# Patient Record
Sex: Male | Born: 1965 | Race: White | Hispanic: No | Marital: Married | Smoking: Never smoker
Health system: Southern US, Community
[De-identification: ages and names within clinical notes are randomized; demographics above are authoritative.]

## PROBLEM LIST (undated history)

## (undated) DIAGNOSIS — H9190 Unspecified hearing loss, unspecified ear: Secondary | ICD-10-CM

## (undated) DIAGNOSIS — I82409 Acute embolism and thrombosis of unspecified deep veins of unspecified lower extremity: Secondary | ICD-10-CM

## (undated) DIAGNOSIS — J189 Pneumonia, unspecified organism: Secondary | ICD-10-CM

## (undated) DIAGNOSIS — Z9289 Personal history of other medical treatment: Secondary | ICD-10-CM

## (undated) DIAGNOSIS — K760 Fatty (change of) liver, not elsewhere classified: Secondary | ICD-10-CM

## (undated) DIAGNOSIS — Z8489 Family history of other specified conditions: Secondary | ICD-10-CM

## (undated) DIAGNOSIS — L409 Psoriasis, unspecified: Secondary | ICD-10-CM

## (undated) DIAGNOSIS — Q6211 Congenital occlusion of ureteropelvic junction: Secondary | ICD-10-CM

## (undated) DIAGNOSIS — I517 Cardiomegaly: Secondary | ICD-10-CM

## (undated) DIAGNOSIS — G8929 Other chronic pain: Secondary | ICD-10-CM

## (undated) DIAGNOSIS — I5032 Chronic diastolic (congestive) heart failure: Secondary | ICD-10-CM

## (undated) DIAGNOSIS — M51379 Other intervertebral disc degeneration, lumbosacral region without mention of lumbar back pain or lower extremity pain: Secondary | ICD-10-CM

## (undated) DIAGNOSIS — Z8673 Personal history of transient ischemic attack (TIA), and cerebral infarction without residual deficits: Secondary | ICD-10-CM

## (undated) DIAGNOSIS — M1990 Unspecified osteoarthritis, unspecified site: Secondary | ICD-10-CM

## (undated) DIAGNOSIS — Z9889 Other specified postprocedural states: Secondary | ICD-10-CM

## (undated) DIAGNOSIS — F4310 Post-traumatic stress disorder, unspecified: Secondary | ICD-10-CM

## (undated) DIAGNOSIS — M542 Cervicalgia: Secondary | ICD-10-CM

## (undated) DIAGNOSIS — H269 Unspecified cataract: Secondary | ICD-10-CM

## (undated) DIAGNOSIS — Z87442 Personal history of urinary calculi: Secondary | ICD-10-CM

## (undated) DIAGNOSIS — M5136 Other intervertebral disc degeneration, lumbar region: Secondary | ICD-10-CM

## (undated) DIAGNOSIS — Z8669 Personal history of other diseases of the nervous system and sense organs: Secondary | ICD-10-CM

## (undated) DIAGNOSIS — R112 Nausea with vomiting, unspecified: Secondary | ICD-10-CM

## (undated) DIAGNOSIS — R569 Unspecified convulsions: Secondary | ICD-10-CM

## (undated) DIAGNOSIS — Z87890 Personal history of sex reassignment: Secondary | ICD-10-CM

## (undated) DIAGNOSIS — G709 Myoneural disorder, unspecified: Secondary | ICD-10-CM

## (undated) DIAGNOSIS — D6859 Other primary thrombophilia: Secondary | ICD-10-CM

## (undated) DIAGNOSIS — Z8619 Personal history of other infectious and parasitic diseases: Secondary | ICD-10-CM

## (undated) DIAGNOSIS — F419 Anxiety disorder, unspecified: Secondary | ICD-10-CM

## (undated) DIAGNOSIS — T84018A Broken internal joint prosthesis, other site, initial encounter: Secondary | ICD-10-CM

## (undated) DIAGNOSIS — S62009A Unspecified fracture of navicular [scaphoid] bone of unspecified wrist, initial encounter for closed fracture: Secondary | ICD-10-CM

## (undated) DIAGNOSIS — R748 Abnormal levels of other serum enzymes: Secondary | ICD-10-CM

## (undated) DIAGNOSIS — I639 Cerebral infarction, unspecified: Secondary | ICD-10-CM

## (undated) DIAGNOSIS — K9041 Non-celiac gluten sensitivity: Secondary | ICD-10-CM

## (undated) DIAGNOSIS — Z96659 Presence of unspecified artificial knee joint: Secondary | ICD-10-CM

## (undated) DIAGNOSIS — D751 Secondary polycythemia: Secondary | ICD-10-CM

## (undated) DIAGNOSIS — G4730 Sleep apnea, unspecified: Secondary | ICD-10-CM

## (undated) DIAGNOSIS — M5090 Cervical disc disorder, unspecified, unspecified cervical region: Secondary | ICD-10-CM

## (undated) DIAGNOSIS — K219 Gastro-esophageal reflux disease without esophagitis: Secondary | ICD-10-CM

## (undated) DIAGNOSIS — R000 Tachycardia, unspecified: Secondary | ICD-10-CM

## (undated) DIAGNOSIS — K5909 Other constipation: Secondary | ICD-10-CM

## (undated) DIAGNOSIS — I341 Nonrheumatic mitral (valve) prolapse: Secondary | ICD-10-CM

## (undated) DIAGNOSIS — Z973 Presence of spectacles and contact lenses: Secondary | ICD-10-CM

## (undated) DIAGNOSIS — Z8719 Personal history of other diseases of the digestive system: Secondary | ICD-10-CM

## (undated) DIAGNOSIS — R634 Abnormal weight loss: Secondary | ICD-10-CM

## (undated) DIAGNOSIS — L309 Dermatitis, unspecified: Secondary | ICD-10-CM

## (undated) DIAGNOSIS — F640 Transsexualism: Secondary | ICD-10-CM

## (undated) DIAGNOSIS — K900 Celiac disease: Secondary | ICD-10-CM

## (undated) DIAGNOSIS — Z789 Other specified health status: Secondary | ICD-10-CM

## (undated) DIAGNOSIS — R161 Splenomegaly, not elsewhere classified: Secondary | ICD-10-CM

## (undated) DIAGNOSIS — G950 Syringomyelia and syringobulbia: Secondary | ICD-10-CM

## (undated) DIAGNOSIS — Z974 Presence of external hearing-aid: Secondary | ICD-10-CM

## (undated) DIAGNOSIS — M5137 Other intervertebral disc degeneration, lumbosacral region: Secondary | ICD-10-CM

## (undated) HISTORY — DX: Personal history of other diseases of the digestive system: Z87.19

## (undated) HISTORY — DX: Abnormal weight loss: R63.4

## (undated) HISTORY — DX: Other specified health status: Z78.9

## (undated) HISTORY — DX: Congenital occlusion of ureteropelvic junction: Q62.11

## (undated) HISTORY — DX: Secondary polycythemia: D75.1

## (undated) HISTORY — PX: THUMB ARTHROSCOPY: SHX2509

## (undated) HISTORY — DX: Anxiety disorder, unspecified: F41.9

## (undated) HISTORY — DX: Transsexualism: F64.0

## (undated) HISTORY — PX: MASTECTOMY: SHX3

## (undated) HISTORY — DX: Personal history of sex reassignment: Z87.890

## (undated) HISTORY — PX: MOUTH SURGERY: SHX715

## (undated) HISTORY — DX: Non-celiac gluten sensitivity: K90.41

## (undated) HISTORY — DX: Sleep apnea, unspecified: G47.30

## (undated) HISTORY — DX: Unspecified fracture of navicular (scaphoid) bone of unspecified wrist, initial encounter for closed fracture: S62.009A

## (undated) HISTORY — DX: Personal history of transient ischemic attack (TIA), and cerebral infarction without residual deficits: Z86.73

## (undated) HISTORY — DX: Other primary thrombophilia: D68.59

## (undated) HISTORY — DX: Unspecified cataract: H26.9

## (undated) HISTORY — PX: EYE SURGERY: SHX253

## (undated) HISTORY — PX: KNEE ARTHROPLASTY: SHX992

## (undated) HISTORY — DX: Cervicalgia: M54.2

## (undated) HISTORY — PX: SHOULDER SURGERY: SHX246

## (undated) HISTORY — DX: Pneumonia, unspecified organism: J18.9

## (undated) HISTORY — DX: Celiac disease: K90.0

## (undated) HISTORY — PX: COLONOSCOPY: SHX174

## (undated) HISTORY — DX: Unspecified osteoarthritis, unspecified site: M19.90

## (undated) HISTORY — PX: OVARIAN CYST SURGERY: SHX726

## (undated) HISTORY — PX: KNEE JOINT MANIPULATION: SHX386

## (undated) HISTORY — DX: Presence of unspecified artificial knee joint: Z96.659

## (undated) HISTORY — DX: Broken internal joint prosthesis, other site, initial encounter: T84.018A

## (undated) HISTORY — DX: Syringomyelia and syringobulbia: G95.0

## (undated) HISTORY — PX: CHOLECYSTECTOMY: SHX55

---
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
## 1992-03-03 HISTORY — PX: ABDOMINAL HYSTERECTOMY: SHX81

## 2001-03-03 HISTORY — PX: UPPER GI ENDOSCOPY: SHX6162

## 2002-09-20 DIAGNOSIS — Z789 Other specified health status: Secondary | ICD-10-CM | POA: Insufficient documentation

## 2003-03-04 HISTORY — PX: LITHOTRIPSY: SUR834

## 2005-03-03 HISTORY — PX: ANKLE ARTHROSCOPY WITH RECONSTRUCTION: SHX5583

## 2006-03-03 HISTORY — PX: THYROIDECTOMY, PARTIAL: SHX18

## 2010-05-27 DIAGNOSIS — K900 Celiac disease: Secondary | ICD-10-CM | POA: Insufficient documentation

## 2010-05-27 DIAGNOSIS — E041 Nontoxic single thyroid nodule: Secondary | ICD-10-CM | POA: Insufficient documentation

## 2010-06-05 DIAGNOSIS — Z9229 Personal history of other drug therapy: Secondary | ICD-10-CM | POA: Insufficient documentation

## 2010-12-11 DIAGNOSIS — N1330 Unspecified hydronephrosis: Secondary | ICD-10-CM | POA: Insufficient documentation

## 2010-12-17 DIAGNOSIS — J189 Pneumonia, unspecified organism: Secondary | ICD-10-CM

## 2010-12-17 HISTORY — DX: Pneumonia, unspecified organism: J18.9

## 2011-03-04 HISTORY — PX: LIVER BIOPSY: SHX301

## 2011-12-29 DIAGNOSIS — L4050 Arthropathic psoriasis, unspecified: Secondary | ICD-10-CM | POA: Insufficient documentation

## 2012-03-29 DIAGNOSIS — R748 Abnormal levels of other serum enzymes: Secondary | ICD-10-CM | POA: Insufficient documentation

## 2012-04-29 DIAGNOSIS — L4050 Arthropathic psoriasis, unspecified: Secondary | ICD-10-CM | POA: Insufficient documentation

## 2012-06-01 DIAGNOSIS — K760 Fatty (change of) liver, not elsewhere classified: Secondary | ICD-10-CM | POA: Insufficient documentation

## 2012-10-18 DIAGNOSIS — R002 Palpitations: Secondary | ICD-10-CM | POA: Insufficient documentation

## 2012-10-25 DIAGNOSIS — R51 Headache: Secondary | ICD-10-CM

## 2012-10-25 DIAGNOSIS — R519 Headache, unspecified: Secondary | ICD-10-CM | POA: Insufficient documentation

## 2013-03-15 DIAGNOSIS — M1710 Unilateral primary osteoarthritis, unspecified knee: Secondary | ICD-10-CM | POA: Diagnosis present

## 2013-03-15 DIAGNOSIS — M179 Osteoarthritis of knee, unspecified: Secondary | ICD-10-CM | POA: Diagnosis present

## 2013-05-10 DIAGNOSIS — E669 Obesity, unspecified: Secondary | ICD-10-CM | POA: Insufficient documentation

## 2013-05-10 DIAGNOSIS — M1990 Unspecified osteoarthritis, unspecified site: Secondary | ICD-10-CM | POA: Insufficient documentation

## 2013-05-10 DIAGNOSIS — R600 Localized edema: Secondary | ICD-10-CM | POA: Insufficient documentation

## 2013-05-10 DIAGNOSIS — IMO0002 Reserved for concepts with insufficient information to code with codable children: Secondary | ICD-10-CM | POA: Insufficient documentation

## 2013-05-10 DIAGNOSIS — E039 Hypothyroidism, unspecified: Secondary | ICD-10-CM | POA: Insufficient documentation

## 2013-05-10 DIAGNOSIS — K219 Gastro-esophageal reflux disease without esophagitis: Secondary | ICD-10-CM | POA: Insufficient documentation

## 2013-05-11 HISTORY — PX: HIP ARTHROSCOPY W/ LABRAL REPAIR: SHX1750

## 2013-07-25 DIAGNOSIS — R479 Unspecified speech disturbances: Secondary | ICD-10-CM | POA: Insufficient documentation

## 2013-08-01 DIAGNOSIS — D6859 Other primary thrombophilia: Secondary | ICD-10-CM | POA: Insufficient documentation

## 2013-08-01 DIAGNOSIS — F4310 Post-traumatic stress disorder, unspecified: Secondary | ICD-10-CM | POA: Insufficient documentation

## 2013-08-04 DIAGNOSIS — F418 Other specified anxiety disorders: Secondary | ICD-10-CM | POA: Insufficient documentation

## 2013-08-31 DIAGNOSIS — G4731 Primary central sleep apnea: Secondary | ICD-10-CM | POA: Insufficient documentation

## 2013-09-23 DIAGNOSIS — S62009A Unspecified fracture of navicular [scaphoid] bone of unspecified wrist, initial encounter for closed fracture: Secondary | ICD-10-CM

## 2013-09-23 HISTORY — DX: Unspecified fracture of navicular (scaphoid) bone of unspecified wrist, initial encounter for closed fracture: S62.009A

## 2013-11-29 ENCOUNTER — Telehealth: Payer: Self-pay | Admitting: Hematology

## 2013-11-29 NOTE — Telephone Encounter (Signed)
LEFT MESSAGE FOR PATIENT TO RETUERN CALL TO SCHEDULE NP APPT

## 2013-12-08 DIAGNOSIS — G4733 Obstructive sleep apnea (adult) (pediatric): Secondary | ICD-10-CM | POA: Insufficient documentation

## 2013-12-15 ENCOUNTER — Telehealth: Payer: Self-pay | Admitting: Hematology

## 2013-12-15 NOTE — Telephone Encounter (Signed)
S/W PATIENT AND  GAVE NEW D/T FOR 10/26 @ 10:30 W/DR. SEHABI.

## 2013-12-16 ENCOUNTER — Ambulatory Visit: Payer: Self-pay

## 2013-12-26 ENCOUNTER — Telehealth: Payer: Self-pay | Admitting: Hematology

## 2013-12-26 ENCOUNTER — Ambulatory Visit (HOSPITAL_BASED_OUTPATIENT_CLINIC_OR_DEPARTMENT_OTHER): Payer: BC Managed Care – PPO

## 2013-12-26 ENCOUNTER — Encounter (INDEPENDENT_AMBULATORY_CARE_PROVIDER_SITE_OTHER): Payer: Self-pay

## 2013-12-26 ENCOUNTER — Ambulatory Visit: Payer: BC Managed Care – PPO

## 2013-12-26 ENCOUNTER — Other Ambulatory Visit: Payer: Self-pay

## 2013-12-26 ENCOUNTER — Ambulatory Visit (HOSPITAL_BASED_OUTPATIENT_CLINIC_OR_DEPARTMENT_OTHER): Payer: BC Managed Care – PPO | Admitting: Hematology

## 2013-12-26 ENCOUNTER — Encounter: Payer: Self-pay | Admitting: Hematology

## 2013-12-26 VITALS — BP 111/81 | HR 74 | Temp 98.3°F | Resp 18 | Ht 68.0 in | Wt 203.7 lb

## 2013-12-26 DIAGNOSIS — L4050 Arthropathic psoriasis, unspecified: Secondary | ICD-10-CM

## 2013-12-26 DIAGNOSIS — D751 Secondary polycythemia: Secondary | ICD-10-CM

## 2013-12-26 DIAGNOSIS — R413 Other amnesia: Secondary | ICD-10-CM

## 2013-12-26 DIAGNOSIS — F640 Transsexualism: Secondary | ICD-10-CM

## 2013-12-26 DIAGNOSIS — M542 Cervicalgia: Secondary | ICD-10-CM

## 2013-12-26 DIAGNOSIS — D6859 Other primary thrombophilia: Secondary | ICD-10-CM

## 2013-12-26 DIAGNOSIS — F418 Other specified anxiety disorders: Secondary | ICD-10-CM

## 2013-12-26 DIAGNOSIS — G458 Other transient cerebral ischemic attacks and related syndromes: Secondary | ICD-10-CM

## 2013-12-26 DIAGNOSIS — Z862 Personal history of diseases of the blood and blood-forming organs and certain disorders involving the immune mechanism: Secondary | ICD-10-CM

## 2013-12-26 DIAGNOSIS — Z8719 Personal history of other diseases of the digestive system: Secondary | ICD-10-CM

## 2013-12-26 DIAGNOSIS — G4730 Sleep apnea, unspecified: Secondary | ICD-10-CM

## 2013-12-26 DIAGNOSIS — R634 Abnormal weight loss: Secondary | ICD-10-CM

## 2013-12-26 DIAGNOSIS — Z789 Other specified health status: Secondary | ICD-10-CM

## 2013-12-26 DIAGNOSIS — I82401 Acute embolism and thrombosis of unspecified deep veins of right lower extremity: Secondary | ICD-10-CM

## 2013-12-26 DIAGNOSIS — Z8673 Personal history of transient ischemic attack (TIA), and cerebral infarction without residual deficits: Secondary | ICD-10-CM

## 2013-12-26 LAB — CBC & DIFF AND RETIC
BASO%: 0 % (ref 0.0–2.0)
Basophils Absolute: 0 10*3/uL (ref 0.0–0.1)
EOS%: 0 % (ref 0.0–7.0)
Eosinophils Absolute: 0 10*3/uL (ref 0.0–0.5)
HCT: 50.7 % — ABNORMAL HIGH (ref 38.4–49.9)
HGB: 17.7 g/dL — ABNORMAL HIGH (ref 13.0–17.1)
Immature Retic Fract: 2.8 % — ABNORMAL LOW (ref 3.00–10.60)
LYMPH#: 1.4 10*3/uL (ref 0.9–3.3)
LYMPH%: 28.9 % (ref 14.0–49.0)
MCH: 31.9 pg (ref 27.2–33.4)
MCHC: 34.9 g/dL (ref 32.0–36.0)
MCV: 91.4 fL (ref 79.3–98.0)
MONO#: 0.4 10*3/uL (ref 0.1–0.9)
MONO%: 8.4 % (ref 0.0–14.0)
NEUT#: 3 10*3/uL (ref 1.5–6.5)
NEUT%: 62.7 % (ref 39.0–75.0)
NRBC: 0 % (ref 0–0)
Platelets: 155 10*3/uL (ref 140–400)
RBC: 5.55 10*6/uL (ref 4.20–5.82)
RDW: 13.3 % (ref 11.0–14.6)
RETIC %: 1.79 % (ref 0.80–1.80)
RETIC CT ABS: 99.35 10*3/uL — AB (ref 34.80–93.90)
WBC: 4.7 10*3/uL (ref 4.0–10.3)

## 2013-12-26 LAB — IRON AND TIBC CHCC
%SAT: 46 % (ref 20–55)
Iron: 138 ug/dL (ref 42–163)
TIBC: 298 ug/dL (ref 202–409)
UIBC: 159 ug/dL (ref 117–376)

## 2013-12-26 LAB — FERRITIN CHCC: Ferritin: 64 ng/ml (ref 22–316)

## 2013-12-26 MED ORDER — RIVAROXABAN (XARELTO) VTE STARTER PACK (15 & 20 MG): ORAL_TABLET | ORAL | Status: DC

## 2013-12-26 NOTE — Progress Notes (Signed)
Therapeutic phlebotomy done for 530 gms.  Tolerated well.  Offered beverage which patient accepted.   BP 118/75, pulse 68 post phlebotomy.

## 2013-12-26 NOTE — Telephone Encounter (Signed)
Gave avs & cal for Nov. Also gave pt prep for Ct scan.

## 2013-12-26 NOTE — Patient Instructions (Signed)

## 2013-12-26 NOTE — Progress Notes (Signed)
Checked in new pt with no financial concerns.  Pt is here for a hematology concern so no financial assistance may be needed but he has my card for any questions or concerns.

## 2013-12-27 ENCOUNTER — Encounter: Payer: Self-pay | Admitting: Hematology

## 2013-12-27 ENCOUNTER — Other Ambulatory Visit: Payer: Self-pay | Admitting: *Deleted

## 2013-12-27 ENCOUNTER — Telehealth: Payer: BC Managed Care – PPO | Admitting: Family

## 2013-12-27 DIAGNOSIS — D751 Secondary polycythemia: Secondary | ICD-10-CM

## 2013-12-27 DIAGNOSIS — Z86718 Personal history of other venous thrombosis and embolism: Secondary | ICD-10-CM | POA: Insufficient documentation

## 2013-12-27 DIAGNOSIS — M542 Cervicalgia: Secondary | ICD-10-CM | POA: Insufficient documentation

## 2013-12-27 DIAGNOSIS — Z8719 Personal history of other diseases of the digestive system: Secondary | ICD-10-CM | POA: Insufficient documentation

## 2013-12-27 DIAGNOSIS — D45 Polycythemia vera: Secondary | ICD-10-CM | POA: Insufficient documentation

## 2013-12-27 DIAGNOSIS — R634 Abnormal weight loss: Secondary | ICD-10-CM | POA: Insufficient documentation

## 2013-12-27 DIAGNOSIS — M545 Low back pain: Secondary | ICD-10-CM

## 2013-12-27 DIAGNOSIS — Z8673 Personal history of transient ischemic attack (TIA), and cerebral infarction without residual deficits: Secondary | ICD-10-CM | POA: Insufficient documentation

## 2013-12-27 MED ORDER — RIVAROXABAN (XARELTO) VTE STARTER PACK (15 & 20 MG): ORAL_TABLET | ORAL | Status: DC

## 2013-12-27 NOTE — Progress Notes (Signed)
[employer] [id number] approved ct chest and abdomen from 12/27/13-01/25/14 auth # [id number]

## 2013-12-27 NOTE — Progress Notes (Signed)
We are sorry that you are not feeling well.  Here is how we plan to help!  Based on what you have shared with me it looks like you will need a face to face visit for complicated symptoms that could represent a serious problem.  Please contact Dr. Arlys John first thing tomorrow morning to make a follow-up appointment.  If you are having a true medical emergency please call 911.  If you need an urgent face to face visit, [place] has four urgent care centers for your convenience.    [facility] [place] a Provider at this Location  [place], [place] [address]   8 am to 8 pm Monday-Friday   9 am to 7 pm [facility] [place] a Provider at this Location  [place], [place], [place] [address]   8 am to 8 pm Monday-Friday   9 am to 6 pm Saturday   11 am to 6 pm Sunday     [facility] [place] Get Driving Directions  [address] [place], [place] [address]   8 am to 8 pm Monday-Friday   9 am to 4 pm Saturday-Sunday     [facility] (a walk in primary care provider)  [place] a Provider at this Location  [place], [place] [address]   8 am to 8:30 pm Monday-Thursday   8 am to 6 pm Friday   8 am to 4 pm Saturday-Sunday  Your e-visit answers were reviewed by a board certified advanced clinical practitioner to complete your personal care plan.  Depending on the condition, your plan could have included both over the counter or prescription medications.  You will get an e-mail in the next two days asking about your experience.  I hope that your e-visit has been valuable and will speed your recovery . Thank you for choosing an e-visit.

## 2013-12-27 NOTE — Progress Notes (Signed)
[place] HEMATOLOGY CONSULT NOTE DATE OF VISIT: 12/27/2013  Patient Care Team: Aretta Nip, MD as PCP - General (Family Medicine) Alechia Lezama Marla Roe, MD as Consulting Physician (Hematology) Aretta Nip, MD as Referring Physician (Family Medicine)  CHIEF COMPLAINTS/PURPOSE OF CONSULTATION:   Discuss anticoagulation strategy and history of CVA/DVT and protein C deficiency.  HISTORY OF PRESENTING ILLNESS:   Jesse Bowers 48 y.o. 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 Patient is a former Software engineer. Patient had a fractured foot repair in 2007 and caused a right leg DVT. At the time he took Coumadin for 3 months.   In May of this year 2015 patient presented with right-sided weakness and slurring of speech, he felt tired, had some nausea and was having trouble gripping the soap when he was in the shower. He checked himself in the mirror there was no facial droop but based on the symptoms he texted his wife took him to the hospital where he was kept overnight. His symptoms of TIA lasted for more than 24 hours and he had appropriate neuro imaging done. When he was seen by a neurologist later, he was told that he had a left frontal stroke. This happened at a young age of 68 so his primary care physician ordered hypercoagulable panel and that showed a protein C deficiency. This TIA or stroke event took place on 07/27/2013 an MRI brain from that date showed changes of early chronic small vessel ischemic changes. A note from the previous hematologist Dr Laury Axon in [facility] indicated that at [facility] they also felt that it was not a stroke. Carotid ultrasound showed minimal atherosclerotic plaques. The hypercoagulable panel was drawn on 08/19/2013 and he was negative for factor V Leiden mutation, prothrombin gene mutation. He had normal levels of antithrombin III and protein S but the protein C  level was decreased at 47%(reference range is 70-180%). A repeat protein C activity done on 09/28/2013 was 58% still low. Lupus anticoagulant was negative. Beta-2 glycoprotein antibodies were negative.  Patient is currently receiving a baby aspirin for last 3 months. The hematologist in [place] Dr. Alain Marion also gave him some Lovenox injections for prophylactic use just before a long distance travel. He encouraged him to avoid inactivity/immobility. Patient was not given an anticoagulant such as warfarin or the new generation drugs like Xarelto or Eliquis.  On 04/28/2013 patient's CBC showed a white count of 6.2 hemoglobin 18.7 hematocrit 53.1 MCV 92 platelet count of 207. Differential was normal. Chemistry panel was normal creatinine 1.23. Patient does have history of elevated liver enzymes in the past and the etiology of that is not clear likely medication related. He had liver biopsy done in 2013 which was reportedly normal. Patient was taking Remicade for over an year and it was helping his skin lesions of psoriasis but not the joint pain. It was switched to Enbrel which he is still taking a subcutaneous shot once a week that is every Sunday.  Patient also have some pain control issues and was seeing a pain specialist in [place]. He is in the process of getting a referral to a pain specialist here. His pain medications include morphine extended release, oxycodone, occasional naproxen and methadone. Patient is also on antidepressants.  Patient also had a clot at PICC line for antibiotics in 2006. Patient had history of psoriasis and psoriatic arthritis being managed by a rheumatologist in [place]. Now he is shifting his care  to a local rheumatologist here. Most of his psoriasis involves the bottom of the feet mostly on the left side, hands, knees and ears.  Patient also had a history of right-sided parotitis documented in May 2015 that was treated with antibiotics. He does have chronic dry mouth because  of sleep apnea as well as his antidepressant therapy.  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 That itself can increase the risk for both arterial as well as venous thromboembolic events. 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 For his depression patient was on Prozac in March 2015 but it was causing decreased sexual libido and changed to Wellbutrin in March 2015. He does have mild constipation with the use of narcotics and takes Colace twice a day.  Patient is also complaining of 50 pound weight loss in the last 3-4 months. He thinks it is unintentional. He has been on immunosuppressant medication such as Remicade and Enbrel and sometimes those can be associated with possibility of non-Hodgkin lymphomas. He also have chronic pain and recently saw an orthopedic physician who recommended MRI of the neck which will be scheduled. He is also seeing a rheumatologist next month, a neurologist and a pain specialist. He does complain of left shoulder pain and left elbow pain. There was some discussion by previous rheumatologist in [place] about switching his biological therapy boost to Stelara or [place] Nail once he is evaluated by a rheumatologist. Patient had already received his flu shot as well as pneumonia shot. He also complains of having significant short-term memory problems and trouble concentrating which could be from his medications or TIA episode.  So patient's active hematologic problems are  #1 Protein C deficiency documented twice. #2 TIA/CVA in May 2015 #3 history of DVT in 2007 Right leg treated with Coumadin x 3 months. #3 Use of immunosuppressant medications which increase the likelihood of lymphomas. #4 50 pound weight loss since August 2015. 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 #6 2 episodes of Right parotitis treated with antibiotics #7 Whether he needs anticoagulation are not? If yes what drug to use? #8 Celiac disease which can also cause malabsorption leading to nutritional deficiencies. #9 Whether he needs therapeutic phlebotomy for his polycythemia?  MEDICAL HISTORY:   Past Medical History  Diagnosis Date  . Anxiety   . psoriatic arthritis   . H/O protein C deficiency   . Hx-TIA (transient ischemic attack)   . Clotting disorder   . Celiac disease   . H/O parotitis     right   . Polycythemia, secondary   . Transgendered   . Sleep apnea   . Neck pain   . Abnormal weight loss   . Gluten enteropathy     SURGICAL HISTORY:  Past Surgical History  Procedure Laterality Date  . Thyroidectomy, partial  2008  . Cholecystectomy    . Ankle arthroscopy with reconstruction Right 2007  . Knee surgery Bilateral 1984    Right ACL, left PCL repair  . Liver biopsy  2013    normal results.  . Hip arthroscopy w/ labral repair Right 05/11/2013    SOCIAL HISTORY:  History   Social History  . Marital Status: Married    Spouse Name: N/A    Number of Children: 2  . Years of Education: 4y college   Occupational History  .  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 Smoking status: Never Smoker   . Smokeless tobacco: Not on file  . Alcohol Use: Yes     Comment: wine once a month  . Drug Use: No  . Sexual Activity: Yes    Birth Control/ Protection: None     Comment: patient is a transgender on testosterone shots, no biological kids   Other Topics Concern  . Not on file   Social History Narrative   Education 4 year college, former Therapist, sports X 15 years, pediatric nurse practitioner x 6 years, did NP degree from [employer] [place]. Relocated to [place] about 2 months ago from [place], [place]. Patient was in [place] for last 4 years and prior to that in [place]. His wife is working as Economist for [employer]. Patient is not working and applying for disability. They have 2 kids but no biologic children.     FAMILY HISTORY:  Family History  Problem Relation Age of Onset  . Stroke Paternal Uncle     age 67  . Stroke Maternal Grandfather     85  . Hypertension Mother   . Psoriasis Mother   . Stroke Maternal Grandmother   . Congestive Heart Failure Maternal Grandmother   . Cancer Paternal Grandfather     ALLERGIES:  is allergic to penicillin g; vancomycin; duloxetine; gabapentin; and sulfa antibiotics.  MEDICATIONS:  Current Outpatient Prescriptions  Medication Sig Dispense Refill  . aspirin 81 MG tablet Take 81 mg by mouth daily.      [name] buPROPion (WELLBUTRIN SR) 150 MG 12 hr tablet Take 150 mg by mouth daily.      . clobetasol ointment (TEMOVATE) 0.05 % Apply topically daily. For psoriasis      . docusate sodium (COLACE) 50 MG capsule Take 1 capsule by mouth 3 (three) times daily as needed.      . enoxaparin (LOVENOX) 40 MG/0.4ML injection Inject 40 mg into the skin. Use if travel more than 2 hours      . etanercept (ENBREL) 50 MG/ML injection Inject into the skin once a week.      [name] FLUoxetine (PROZAC) 20 MG capsule Take 1 capsule by mouth daily.      . methadone (DOLOPHINE) 5 MG tablet 5 mg qAM, 10 mg at night      . morphine (MS CONTIN) 30 MG 12 hr tablet Take 30 mg by mouth 2 (two) times daily.      [name] testosterone cypionate (DEPOTESTOTERONE CYPIONATE) 200 MG/ML injection Inject into the muscle once a week.      . traZODone (DESYREL) 50 MG tablet Take 50 mg by mouth at bedtime.      . Rivaroxaban (XARELTO STARTER PACK) 15 & 20 MG TBPK Take as directed on package: Start with one 66m tablet by mouth twice a day with food. On Day 22, switch to one 242mtablet once a day with food.  51 each  3   No current facility-administered medications for this visit.    REVIEW OF SYSTEMS:   Constitutional: Denies fevers, chills or abnormal night sweats Eyes: Denies  blurriness of vision, double vision or watery eyes Ears, nose, mouth, throat, and face: Denies mucositis or sore throat, +Dry mouth Respiratory: Denies cough, dyspnea or wheezes Cardiovascular: Denies palpitation, chest discomfort or lower extremity swelling Gastrointestinal:  Denies nausea, heartburn, + change in bowel habits that is Constipation Skin: Hx of  psoriasis and arthropathy from it.  Lymphatics: Denies new lymphadenopathy or easy bruising, hx of right parotitis twice. Neurological:Denies numbness, tingling or new weaknesses, recent TIA March 2015 Behavioral/Psych: Mood is stable, no new changes, +depression and anxiety, +memory problems All other systems were reviewed with the patient and are negative.  PHYSICAL EXAMINATION: ECOG PERFORMANCE STATUS: 0  Filed Vitals:   12/26/13 1103  BP: 111/81  Pulse: 74  Temp: 98.3 F (36.8 C)  Resp: 18   Filed Weights   12/26/13 1103  Weight: 203 lb 11.2 oz (92.398 kg)    GENERAL:alert, no distress and comfortable SKIN: skin color, texture, turgor are normal, no rashes or significant lesions EYES: normal, conjunctiva are pink and non-injected, sclera clear OROPHARYNX:no exudate, no erythema and lips, buccal mucosa, and tongue normal  NECK: supple, thyroid normal size, non-tender, without nodularity LYMPH:  no palpable lymphadenopathy in the cervical, axillary or inguinal LUNGS: clear to auscultation and percussion with normal breathing effort HEART: regular rate & rhythm and no murmurs and no lower extremity edema ABDOMEN:abdomen soft, non-tender and normal bowel sounds Musculoskeletal:no cyanosis of digits and no clubbing  PSYCH: alert & oriented x 3 with fluent speech NEURO: no focal motor/sensory deficits  LABORATORY DATA:  I have reviewed the data as listed Lab Results  Component Value Date   WBC 4.7 12/26/2013   HGB 17.7* 12/26/2013   HCT 50.7* 12/26/2013   MCV 91.4 12/26/2013   PLT 155 12/26/2013   No results found  for this basename: NA, K, CL, CO2, GLUCOSE, BUN, CREATININE, CALCIUM, GFRNONAA, GFRAA, PROT, ALBUMIN, AST, ALT, ALKPHOS, BILITOT, BILIDIR, IBILI,  in the last 8760 hours  RADIOGRAPHIC STUDIES: No results found.  ASSESSMENT & PLAN:   1. Azul Brumett is a pleasant 48 years old man with a history of TIA/CVA clinically in May 2015 and thereafter he was placed on Baby Aspirin.  2. He did have Polycythemia in February 2015 but I don't see that being addressed in terms of diagnosis or management. It is likely from his sleep apnea as well as the use of testosterone injections.  3. Patient have a history of a DVT in the right leg in 2007 precipitated by an ankle surgery and treated with 3 months of Coumadin.  4. Thrombophilia workup showed that he has Protein C deficiency which is an inherited condition and can increase the risk for venous as well as arterial events. Three clinical syndromes are associated with protein C deficiency: Venous thromboembolism in heterozygous and rare homozygous or doubly heterozygous teenagers or adults Neonatal purpura fulminans in homozygous or doubly heterozygous newborns Coumadin-induced skin necrosis in certain heterozygous teenagers or adults.   5. The most common sites of disease are the deep veins of the legs, the iliofemoral veins, and the mesenteric veins. Cerebral venous thrombosis has been associated with protein C deficiency and with the other congenital prothrombotic conditions, particularly when combined with acquired risk factors for thrombosis such as oral contraceptives or use of testosterone or Polycythemia.   6. There have been reports of nonhemorrhagic arterial stroke in young adults with hereditary protein C deficiency, but larger studies have not convincingly demonstrated that protein C deficiency is a risk factor for the development of arterial thrombosis.  7. I'm also concerned about the significant weight loss in the setting of use of  immunosuppressant medications such as Remicade and Enbrel and I believe it is important to do a CT scan of the chest abdomen and pelvis to rule out any lymphoproliferative process  or the possibility of a solid tumor which can also cause paraneoplastic polycythemia.  8. My recommendation for his anticoagulation is that since he has a history of DVT, protein C deficiency and recently TIA versus stroke, we should anticoagulate him fully and I would advocate using Xarelto. It is a factor X-a inhibitor.Dose is 15 mg orally twice daily with food for the first 21 days for the initial treatment of acute DVT or PE. After the initial treatment period, 20 mg orally once daily with food for the remaining treatment and the long-term reduction in the risk of recurrence of DVT and of PE. It is approved by the FDA in patients with atrial fibrillation to reduce the risk for stroke as it is a blood thinner. I would at least recommend 3 more months of baby aspirin thromboprophylaxis and thereafter in consultation with the neurologist, we can leave him on Xarelto alone. Patient will also discuss protein C with his siblings and their children and they should be getting genetic counseling and testing. Patient specifically mentioned that one of his sister have trouble with miscarriages and there may be a connection between protein C deficiency and miscarriages. Xarelto can cause risk of spontaneous bleeding or procedure related bleeding like epidural hematomas leading to complications, kidney and liver problems and no good antidote is available. It is mainly an issue of risks versus benefits and we reached the consensus that benefits outweigh the risks in his case with the recent TIA episode, polycythemia, DVT history and having a protein C deficiency.  9. Patient also got a therapeutic phlebotomy in my office today as his hematocrit was 50.7. I'm also checking a E-93 and folic acid level and serum JAK-2 mutation to rule out any  myeloproliferative diseases. I also ordered iron studies and ferritin.   10. Patient will return to clinic in 2 weeks for follow-up. While he is on Xarelto, he does not have to take Lovenox. In case patient needs any elective surgery epidural injections, he should stop Xarelto day before the procedure and resume it after procedure once adequate hemostasis is achieved as deemed by the surgeon. I will make a plan for future TP (Therapeutic phlebotomies) once I see him in office initially may be once a month for a hematocrit trigger of 45 or greater.    Orders Placed This Encounter  Procedures  . CT Chest Wo Contrast    Standing Status: Future     Number of Occurrences:      Standing Expiration Date: 12/26/2014    Order Specific Question:  Reason for Exam (SYMPTOM  OR DIAGNOSIS REQUIRED)    Answer:  50 lbs weight loss, r/o lymphoma or solid tumor, pt on immunosuppressants for psoriasis     Comments:  prefer in pm    Order Specific Question:  Preferred imaging location?    Answer:  [facility]  . CT Abdomen W Contrast    Standing Status: Future     Number of Occurrences:      Standing Expiration Date: 12/26/2014    Order Specific Question:  Reason for Exam (SYMPTOM  OR DIAGNOSIS REQUIRED)    Answer:  50 lbs weight loss, r/o lymphoma or solid tumor, pt on immunosuppressants for psoriasis     Comments:  pm prefer    Order Specific Question:  Preferred imaging location?    Answer:  [facility]  . CBC with Differential    Standing Status: Future     Number of Occurrences: 1  Standing Expiration Date: 12/27/2015  . Reticulocyte Count    Standing Status: Future     Number of Occurrences: 1     Standing Expiration Date: 12/27/2015  . Protein C activity*    Standing Status: Future     Number of Occurrences: 1     Standing Expiration Date: 12/27/2015  . Protein C, total*    Standing Status: Future     Number of Occurrences: 1     Standing Expiration Date: 12/27/2015   . Iron and TIBC [facility]    Standing Status: Future     Number of Occurrences: 1     Standing Expiration Date: 12/27/2015  . Ferritin    Standing Status: Future     Number of Occurrences: 1     Standing Expiration Date: 12/27/2015  . Vitamin B12    Standing Status: Future     Number of Occurrences: 1     Standing Expiration Date: 12/27/2015  . Folate, Serum    Standing Status: Future     Number of Occurrences: 1     Standing Expiration Date: 12/27/2015  . [id number]    Standing Status: Future     Number of Occurrences: 1     Standing Expiration Date: 12/27/2015    All questions were answered. The patient knows to call the clinic with any problems, questions or concerns. I spent 35 minutes counseling the patient face to face. The total time spent in the appointment was 60 minutes and more than 50% was on counseling.    Bernadene Bell, MD Medical Hematologist/Oncologist [place] Pager: [phone] Office No: [phone]

## 2013-12-29 ENCOUNTER — Ambulatory Visit (HOSPITAL_COMMUNITY)
Admission: RE | Admit: 2013-12-29 | Discharge: 2013-12-29 | Disposition: A | Discharge status: Home / Self Care | Payer: BC Managed Care – PPO | Source: Ambulatory Visit | Attending: Interventional Radiology | Admitting: Interventional Radiology

## 2013-12-29 ENCOUNTER — Encounter: Payer: Self-pay | Admitting: Hematology

## 2013-12-29 ENCOUNTER — Other Ambulatory Visit: Payer: Self-pay | Admitting: Hematology

## 2013-12-29 ENCOUNTER — Encounter (HOSPITAL_COMMUNITY): Payer: Self-pay

## 2013-12-29 DIAGNOSIS — E079 Disorder of thyroid, unspecified: Secondary | ICD-10-CM | POA: Diagnosis not present

## 2013-12-29 DIAGNOSIS — N1330 Unspecified hydronephrosis: Secondary | ICD-10-CM | POA: Diagnosis not present

## 2013-12-29 DIAGNOSIS — M8580 Other specified disorders of bone density and structure, unspecified site: Secondary | ICD-10-CM | POA: Diagnosis not present

## 2013-12-29 DIAGNOSIS — D751 Secondary polycythemia: Secondary | ICD-10-CM

## 2013-12-29 DIAGNOSIS — R634 Abnormal weight loss: Secondary | ICD-10-CM | POA: Diagnosis present

## 2013-12-29 LAB — PROTEIN C ACTIVITY: Protein C Activity: 109 % (ref 75–133)

## 2013-12-29 LAB — FOLATE: FOLATE: 8.6 ng/mL

## 2013-12-29 LAB — VITAMIN B12: Vitamin B-12: 320 pg/mL (ref 211–911)

## 2013-12-29 LAB — PROTEIN C, TOTAL: PROTEIN C, TOTAL: 72 % (ref 72–160)

## 2013-12-29 MED ORDER — IOHEXOL 300 MG/ML  SOLN
100.0000 mL | Freq: Once | INTRAMUSCULAR | Status: AC | PRN
  Administered 2013-12-29: 100 mL via INTRAVENOUS

## 2013-12-30 ENCOUNTER — Telehealth: Payer: Self-pay

## 2013-12-30 NOTE — Telephone Encounter (Signed)
S/w pt that his CT is essentially normal. Will mail copy to him. Verified 11/9 appt.

## 2014-01-06 DIAGNOSIS — G950 Syringomyelia and syringobulbia: Secondary | ICD-10-CM

## 2014-01-06 HISTORY — DX: Syringomyelia and syringobulbia: G95.0

## 2014-01-09 ENCOUNTER — Ambulatory Visit (HOSPITAL_BASED_OUTPATIENT_CLINIC_OR_DEPARTMENT_OTHER): Payer: BC Managed Care – PPO | Admitting: Hematology

## 2014-01-09 VITALS — BP 124/79 | HR 84 | Temp 98.4°F | Resp 18 | Ht 68.0 in | Wt 201.2 lb

## 2014-01-09 DIAGNOSIS — R634 Abnormal weight loss: Secondary | ICD-10-CM

## 2014-01-09 DIAGNOSIS — D6859 Other primary thrombophilia: Secondary | ICD-10-CM

## 2014-01-09 DIAGNOSIS — D751 Secondary polycythemia: Secondary | ICD-10-CM

## 2014-01-09 DIAGNOSIS — Z86718 Personal history of other venous thrombosis and embolism: Secondary | ICD-10-CM

## 2014-01-09 NOTE — Progress Notes (Signed)
[place] HEMATOLOGY FOLLOW UP NOTE DATE OF VISIT: 01/09/2014  Patient Care Team: Aretta Nip, MD as PCP - General (Family Medicine) Agastya Meister Marla Roe, MD as Consulting Physician (Hematology) Aretta Nip, MD as Referring Physician (Family Medicine)  CHIEF COMPLAINTS/PURPOSE OF CONSULTATION:   Discuss anticoagulation strategy and history of CVA/DVT and protein C deficiency.  HISTORY OF PRESENTING ILLNESS:   Jesse Bowers 48 y.o. 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 Patient is a former Software engineer. Patient had a fractured foot repair in 2007 and caused a right leg DVT. At the time he took Coumadin for 3 months.   In May of this year 2015 patient presented with right-sided weakness and slurring of speech, he felt tired, had some nausea and was having trouble gripping the soap when he was in the shower. He checked himself in the mirror there was no facial droop but based on the symptoms he texted his wife took him to the hospital where he was kept overnight. His symptoms of TIA lasted for more than 24 hours and he had appropriate neuro imaging done. When he was seen by a neurologist later, he was told that he had a left frontal stroke. This happened at a young age of 82 so his primary care physician ordered hypercoagulable panel and that showed a protein C deficiency. This TIA or stroke event took place on 07/27/2013 an MRI brain from that date showed changes of early chronic small vessel ischemic changes. A note from the previous hematologist Dr Laury Axon in [facility] indicated that at [facility] they also felt that it was not a stroke. Carotid ultrasound showed minimal atherosclerotic plaques. The hypercoagulable panel was drawn on 08/19/2013 and he was negative for factor V Leiden mutation, prothrombin gene mutation. He had normal levels of antithrombin III and protein S but the protein  C level was decreased at 47%(reference range is 70-180%). A repeat protein C activity done on 09/28/2013 was 58% still low. Lupus anticoagulant was negative. Beta-2 glycoprotein antibodies were negative.  Patient is currently receiving a baby aspirin for last 3 months. The hematologist in [place] Dr. Alain Marion also gave him some Lovenox injections for prophylactic use just before a long distance travel. He encouraged him to avoid inactivity/immobility. Patient was not given an anticoagulant such as warfarin or the new generation drugs like Xarelto or Eliquis.  On 04/28/2013 patient's CBC showed a white count of 6.2 hemoglobin 18.7 hematocrit 53.1 MCV 92 platelet count of 207. Differential was normal. Chemistry panel was normal creatinine 1.23. Patient does have history of elevated liver enzymes in the past and the etiology of that is not clear likely medication related. He had liver biopsy done in 2013 which was reportedly normal. Patient was taking Remicade for over an year and it was helping his skin lesions of psoriasis but not the joint pain. It was switched to Enbrel which he is still taking a subcutaneous shot once a week that is every Sunday.  Patient also have some pain control issues and was seeing a pain specialist in [place]. He is in the process of getting a referral to a pain specialist here. His pain medications include morphine extended release, oxycodone, occasional naproxen and methadone. Patient is also on antidepressants.  Patient also had a clot at PICC line for antibiotics in 2006. Patient had history of psoriasis and psoriatic arthritis being managed by a rheumatologist in [place]. Now he is shifting his  care to a local rheumatologist here. Most of his psoriasis involves the bottom of the feet mostly on the left side, hands, knees and ears.  Patient also had a history of right-sided parotitis documented in May 2015 that was treated with antibiotics. He does have chronic dry mouth  because of sleep apnea as well as his antidepressant therapy.  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 That itself can increase the risk for both arterial as well as venous thromboembolic events. 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 For his depression patient was on Prozac in March 2015 but it was causing decreased sexual libido and changed to Wellbutrin in March 2015. He does have mild constipation with the use of narcotics and takes Colace twice a day.  Patient is also complaining of 50 pound weight loss in the last 3-4 months. He thinks it is unintentional. He has been on immunosuppressant medication such as Remicade and Enbrel and sometimes those can be associated with possibility of non-Hodgkin lymphomas. He also have chronic pain and recently saw an orthopedic physician who recommended MRI of the neck which will be scheduled. He is also seeing a rheumatologist next month, a neurologist and a pain specialist. He does complain of left shoulder pain and left elbow pain. There was some discussion by previous rheumatologist in [place] about switching his biological therapy boost to Stelara or [place] Nail once he is evaluated by a rheumatologist. Patient had already received his flu shot as well as pneumonia shot. He also complains of having significant short-term memory problems and trouble concentrating which could be from his medications or TIA episode.  So patient's active hematologic problems are  #1 Protein C deficiency documented twice. #2 TIA/CVA in May 2015 #3 history of DVT in 2007 Right leg treated with Coumadin x 3 months. #3 Use of immunosuppressant medications which increase the likelihood of lymphomas. #4 50 pound weight loss since August 2015. 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 #6 2 episodes of Right parotitis treated with antibiotics #7 Whether he needs anticoagulation are not? If yes what drug to use? #8 Celiac disease which can also cause malabsorption leading to nutritional deficiencies. #9 Whether he needs therapeutic phlebotomy for his polycythemia?   INTERVAL HISTORY:  Patient's JAK 2 mutation came back negative so it is unlikely primary polycythemia or polycythemia vera. He did get 1 therapeutic phlebotomy and I will continue to get therapeutic phlebotomies for next 3 months based on his hematocrit trigger of about 45. Interestingly his protein C levels that his total protein C and functional protein C were normal and I plan to repeat it in 3 months. If it is still in normal range, I will transition him back from Xarelto to aspirin. For now, we will continue with Xarelto.  He did have a MRI of the C-spine DONE ON 01/06/2014 showing a syrinx. He is getting neurosurgical evaluation for that. Since I saw him his sister was also tested for protein C and was found to have a deficiency. Another sister is getting tested now and his parents will also be tested.   His CT scan of the chest abdomen and pelvis done on 12/29/2013 did not show any solid tumor. It did show a nonspecific right thyroid hypodensity and an ultrasound is recommended. I will order that and will ask the  primary care physician to follow-up on that as I'm leaving the practice but patient will see another hematologist in 3 months.  MEDICAL HISTORY:   Past Medical History  Diagnosis Date  . Anxiety   . psoriatic arthritis   . H/O protein C deficiency   . Hx-TIA (transient ischemic attack)   . Clotting disorder   . Celiac disease   . H/O parotitis     right   . Polycythemia, secondary   . Transgendered   . Sleep apnea   . Neck pain   . Abnormal weight loss   . Gluten enteropathy   . Syrinx of spinal cord 01/06/2014 on MRI    c spine    SURGICAL HISTORY:  Past Surgical  History  Procedure Laterality Date  . Thyroidectomy, partial  2008  . Cholecystectomy    . Ankle arthroscopy with reconstruction Right 2007  . Knee surgery Bilateral 1984    Right ACL, left PCL repair  . Liver biopsy  2013    normal results.  . Hip arthroscopy w/ labral repair Right 05/11/2013    SOCIAL HISTORY:  History   Social History  . Marital Status: Married    Spouse Name: N/A    Number of Children: 2  . 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 Smoking status: Never Smoker   . Smokeless tobacco: Not on file  . Alcohol Use: Yes     Comment: wine once a month  . Drug Use: No  . Sexual Activity: Yes    Birth Control/ Protection: None     Comment: patient is a transgender on testosterone shots, no biological kids   Other Topics Concern  . Not on file   Social History Narrative   Education 4 year college, former Therapist, sports X 15 years, pediatric nurse practitioner x 6 years, did NP degree from [employer] [place]. Relocated to [place] about 2 months ago from [place], [place]. Patient was in [place] for last 4 years and prior to that in [place]. His wife is working as Scientist, research (physical sciences) for [employer]. Patient is not working and applying for disability. They have 2 kids but no biologic children.     FAMILY HISTORY:  Family History  Problem Relation Age of Onset  . Stroke Paternal Uncle     age 7  . Stroke Maternal Grandfather     79  . Hypertension Mother   . Psoriasis Mother   . Stroke Maternal Grandmother   . Congestive Heart Failure Maternal Grandmother   . Cancer Paternal Grandfather   . Protein C deficiency Sister 49    Miscarriages    ALLERGIES:  is allergic to penicillin g; vancomycin; duloxetine; gabapentin; and sulfa antibiotics.  MEDICATIONS:  Current Outpatient Prescriptions  Medication Sig Dispense Refill  . aspirin 81 MG tablet Take 81 mg by  mouth daily.    [name] buPROPion (WELLBUTRIN SR) 150 MG 12 hr tablet Take 150 mg by mouth daily.    . clobetasol ointment (TEMOVATE) 0.05 % Apply topically daily. For psoriasis    . docusate sodium (COLACE) 50 MG capsule Take 1 capsule by mouth 3 (three) times daily as needed.    . enoxaparin (LOVENOX) 40 MG/0.4ML injection Inject 40 mg into the skin. Use if travel more than 2 hours    . etanercept (ENBREL) 50 MG/ML injection Inject into the skin  once a week.    [name] FLUoxetine (PROZAC) 20 MG capsule Take 1 capsule by mouth daily.    . methadone (DOLOPHINE) 5 MG tablet 5 mg qAM, 10 mg at night    . morphine (MS CONTIN) 30 MG 12 hr tablet Take 30 mg by mouth 2 (two) times daily.    . Rivaroxaban (XARELTO STARTER PACK) 15 & 20 MG TBPK Take as directed on package: Start with one 32m tablet by mouth twice a day with food. On Day 22, switch to one 26mtablet once a day with food. 51 each 3  . testosterone cypionate (DEPOTESTOTERONE CYPIONATE) 200 MG/ML injection Inject into the muscle once a week.    . traZODone (DESYREL) 50 MG tablet Take 50 mg by mouth at bedtime.     No current facility-administered medications for this visit.    REVIEW OF SYSTEMS:   Constitutional: Denies fevers, chills or abnormal night sweats Eyes: Denies blurriness of vision, double vision or watery eyes Ears, nose, mouth, throat, and face: Denies mucositis or sore throat, +Dry mouth Respiratory: Denies cough, dyspnea or wheezes Cardiovascular: Denies palpitation, chest discomfort or lower extremity swelling Gastrointestinal:  Denies nausea, heartburn, + change in bowel habits that is Constipation Skin: Hx of psoriasis and arthropathy from it.  Lymphatics: Denies new lymphadenopathy or easy bruising, hx of right parotitis twice. Neurological:Denies numbness, tingling or new weaknesses, recent TIA March 2015 Behavioral/Psych: Mood is stable, no new changes, +depression and anxiety, +memory problems All other systems were  reviewed with the patient and are negative.  PHYSICAL EXAMINATION: ECOG PERFORMANCE STATUS: 0  Filed Vitals:   01/09/14 1423  BP: 124/79  Pulse: 84  Temp: 98.4 F (36.9 C)  Resp: 18   Filed Weights   01/09/14 1423  Weight: 201 lb 3.2 oz (91.264 kg)    GENERAL:alert, no distress and comfortable SKIN: skin color, texture, turgor are normal, no rashes or significant lesions EYES: normal, conjunctiva are pink and non-injected, sclera clear OROPHARYNX:no exudate, no erythema and lips, buccal mucosa, and tongue normal  NECK: supple, thyroid normal size, non-tender, without nodularity LYMPH:  no palpable lymphadenopathy in the cervical, axillary or inguinal LUNGS: clear to auscultation and percussion with normal breathing effort HEART: regular rate & rhythm and no murmurs and no lower extremity edema ABDOMEN:abdomen soft, non-tender and normal bowel sounds Musculoskeletal:no cyanosis of digits and no clubbing  PSYCH: alert & oriented x 3 with fluent speech NEURO: no focal motor/sensory deficits  LABORATORY DATA:  I have reviewed the data as listed Lab Results  Component Value Date   WBC 4.7 12/26/2013   HGB 17.7* 12/26/2013   HCT 50.7* 12/26/2013   MCV 91.4 12/26/2013   PLT 155 12/26/2013   No results for input(s): NA, K, CL, CO2, GLUCOSE, BUN, CREATININE, CALCIUM, GFRNONAA, GFRAA, PROT, ALBUMIN, AST, ALT, ALKPHOS, BILITOT, BILIDIR, IBILI in the last 8760 hours.  RADIOGRAPHIC STUDIES: No results found.  ASSESSMENT & PLAN:   1. CoKaymen Adrians a pleasant [age]ears old man with a history of TIA/CVA clinically in May 2015 and thereafter he was placed on Baby Aspirin.  2. He did have Polycythemia in February 2015 but I don't see that being addressed in terms of diagnosis or management. It is likely from his sleep apnea as well as the use of testosterone injections.  3. Patient have a history of a DVT in the right leg in 2007 precipitated by an ankle surgery and treated with 3  months of Coumadin.  4.  Thrombophilia workup showed that he has Protein C deficiency which is an inherited condition and can increase the risk for venous as well as arterial events. Three clinical syndromes are associated with protein C deficiency: Venous thromboembolism in heterozygous and rare homozygous or doubly heterozygous teenagers or adults Neonatal purpura fulminans in homozygous or doubly heterozygous newborns Coumadin-induced skin necrosis in certain heterozygous teenagers or adults.   5. The most common sites of disease are the deep veins of the legs, the iliofemoral veins, and the mesenteric veins. Cerebral venous thrombosis has been associated with protein C deficiency and with the other congenital prothrombotic conditions, particularly when combined with acquired risk factors for thrombosis such as oral contraceptives or use of testosterone or Polycythemia.   6. There have been reports of nonhemorrhagic arterial stroke in young adults with hereditary protein C deficiency, but larger studies have not convincingly demonstrated that protein C deficiency is a risk factor for the development of arterial thrombosis.  7. I'm also concerned about the significant weight loss in the setting of use of immunosuppressant medications such as Remicade and Enbrel and I believe it is important to do a CT scan of the chest abdomen and pelvis to rule out any lymphoproliferative process or the possibility of a solid tumor which can also cause paraneoplastic polycythemia.  8. My recommendation for his anticoagulation is that since he has a history of DVT, protein C deficiency and recently TIA versus stroke, we should anticoagulate him fully and I would advocate using Xarelto. It is a factor X-a inhibitor.Dose is 15 mg orally twice daily with food for the first 21 days for the initial treatment of acute DVT or PE. After the initial treatment period, 20 mg orally once daily with food for the remaining treatment  and the long-term reduction in the risk of recurrence of DVT and of PE. It is approved by the FDA in patients with atrial fibrillation to reduce the risk for stroke as it is a blood thinner. I would at least recommend 3 more months of baby aspirin thromboprophylaxis and thereafter in consultation with the neurologist, we can leave him on Xarelto alone. Patient will also discuss protein C with his siblings and their children and they should be getting genetic counseling and testing. Patient specifically mentioned that one of his sister have trouble with miscarriages and there may be a connection between protein C deficiency and miscarriages. Xarelto can cause risk of spontaneous bleeding or procedure related bleeding like epidural hematomas leading to complications, kidney and liver problems and no good antidote is available. It is mainly an issue of risks versus benefits and we reached the consensus that benefits outweigh the risks in his case with the recent TIA episode, polycythemia, DVT history and having a protein C deficiency.  9. Patient also got a therapeutic phlebotomy in my office today as his hematocrit was 50.7. I'm also checking a S-28 and folic acid level and serum JAK-2 mutation to rule out any myeloproliferative diseases. I also ordered iron studies and ferritin.   10. Today my discussion with the patient was that his CAT scan did not show any malignancy such as lymphoma and we're still not sure what the weight loss. I will order a thyroid function test with his next blood draw on 01/24/2014. Patient also have a abnormal MRI of the C-spine showing a 6 drinks for which she is getting surgical evaluation. His protein C levels came back as normal but I will favor continuing the Xarelto for another  3 months and then repeating protein C level and if it is still normal or high, we can switch him back to aspirin. I'm also ordering an ultrasound of the thyroid to further define the abnormality seen on the  CT scan. He will continue to get a monthly therapeutic phlebotomy if hematocrit is above 45. Patient's family members are also getting tested for protein C deficiency and a sister has been found to have deficiency. That could be contributing to her miscarriages in the past.    Orders Placed This Encounter  Procedures  . US Soft Tissue Head/Neck    Standing Status: Future     Number of Occurrences:      Standing Expiration Date: 01/09/2015    Order Specific Question:  Reason for Exam (SYMPTOM  OR DIAGNOSIS REQUIRED)    Answer:  abonrmal ct scan showing thyroid abnormality    Order Specific Question:  Preferred imaging location?    Answer:  [facility]  . CBC with Differential    Standing Status: Standing     Number of Occurrences: 6     Standing Expiration Date: 01/09/2015  . Protein C activity*    Standing Status: Future     Number of Occurrences:      Standing Expiration Date: 01/10/2016  . Protein C, total*    Standing Status: Future     Number of Occurrences:      Standing Expiration Date: 01/10/2016  . TSH-Thyroid Stimulating Hormone    Standing Status: Future     Number of Occurrences:      Standing Expiration Date: 01/09/2015    All questions were answered. The patient knows to call the clinic with any problems, questions or concerns. I spent 15 minutes counseling the patient face to face. The total time spent in the appointment was 25 minutes and more than 50% was on counseling.    Bernadene Bell, MD Medical Hematologist/Oncologist [place] Pager: [phone] Office No: [phone]

## 2014-01-10 ENCOUNTER — Telehealth: Payer: Self-pay | Admitting: *Deleted

## 2014-01-10 NOTE — Telephone Encounter (Signed)
Per staff message and POF I have scheduled appts. Advised scheduler of appts. JMW  

## 2014-01-17 ENCOUNTER — Encounter: Payer: Self-pay | Admitting: Physical Medicine & Rehabilitation

## 2014-01-20 ENCOUNTER — Other Ambulatory Visit: Payer: Self-pay | Admitting: *Deleted

## 2014-01-20 ENCOUNTER — Ambulatory Visit (HOSPITAL_COMMUNITY)
Admission: RE | Admit: 2014-01-20 | Discharge: 2014-01-20 | Disposition: A | Discharge status: Home / Self Care | Payer: BC Managed Care – PPO | Source: Ambulatory Visit | Attending: Hematology | Admitting: Hematology

## 2014-01-20 DIAGNOSIS — D751 Secondary polycythemia: Secondary | ICD-10-CM

## 2014-01-20 DIAGNOSIS — E042 Nontoxic multinodular goiter: Secondary | ICD-10-CM | POA: Diagnosis not present

## 2014-01-20 DIAGNOSIS — R938 Abnormal findings on diagnostic imaging of other specified body structures: Secondary | ICD-10-CM | POA: Diagnosis present

## 2014-01-20 DIAGNOSIS — I82409 Acute embolism and thrombosis of unspecified deep veins of unspecified lower extremity: Secondary | ICD-10-CM

## 2014-01-20 DIAGNOSIS — Z862 Personal history of diseases of the blood and blood-forming organs and certain disorders involving the immune mechanism: Secondary | ICD-10-CM

## 2014-01-20 MED ORDER — RIVAROXABAN 20 MG PO TABS: 20.0000 mg | ORAL_TABLET | Freq: Every day | ORAL | Status: DC

## 2014-01-23 ENCOUNTER — Ambulatory Visit (HOSPITAL_COMMUNITY): Payer: BC Managed Care – PPO

## 2014-01-24 ENCOUNTER — Ambulatory Visit (HOSPITAL_BASED_OUTPATIENT_CLINIC_OR_DEPARTMENT_OTHER): Payer: BC Managed Care – PPO

## 2014-01-24 ENCOUNTER — Other Ambulatory Visit (HOSPITAL_BASED_OUTPATIENT_CLINIC_OR_DEPARTMENT_OTHER): Payer: BC Managed Care – PPO

## 2014-01-24 DIAGNOSIS — D751 Secondary polycythemia: Secondary | ICD-10-CM

## 2014-01-24 DIAGNOSIS — R634 Abnormal weight loss: Secondary | ICD-10-CM

## 2014-01-24 DIAGNOSIS — D6859 Other primary thrombophilia: Secondary | ICD-10-CM

## 2014-01-24 LAB — CBC WITH DIFFERENTIAL/PLATELET
BASO%: 0.2 % (ref 0.0–2.0)
Basophils Absolute: 0 10*3/uL (ref 0.0–0.1)
EOS ABS: 0 10*3/uL (ref 0.0–0.5)
EOS%: 0 % (ref 0.0–7.0)
HEMATOCRIT: 49.8 % (ref 38.4–49.9)
HGB: 16.7 g/dL (ref 13.0–17.1)
LYMPH%: 17.8 % (ref 14.0–49.0)
MCH: 31.1 pg (ref 27.2–33.4)
MCHC: 33.5 g/dL (ref 32.0–36.0)
MCV: 92.9 fL (ref 79.3–98.0)
MONO#: 0.3 10*3/uL (ref 0.1–0.9)
MONO%: 6.2 % (ref 0.0–14.0)
NEUT%: 75.8 % — AB (ref 39.0–75.0)
NEUTROS ABS: 3.8 10*3/uL (ref 1.5–6.5)
PLATELETS: 170 10*3/uL (ref 140–400)
RBC: 5.36 10*6/uL (ref 4.20–5.82)
RDW: 13.5 % (ref 11.0–14.6)
WBC: 5 10*3/uL (ref 4.0–10.3)
lymph#: 0.9 10*3/uL (ref 0.9–3.3)

## 2014-01-24 LAB — TSH CHCC: TSH: 0.778 m[IU]/L (ref 0.320–4.118)

## 2014-01-24 NOTE — Patient Instructions (Signed)

## 2014-01-24 NOTE — Progress Notes (Signed)
500 grams removed from left A/C. Patient tolerated well. Snacks/drinks provided before and after phlebotomy.

## 2014-02-22 ENCOUNTER — Other Ambulatory Visit: Payer: Self-pay | Admitting: Physician Assistant

## 2014-02-22 ENCOUNTER — Ambulatory Visit (HOSPITAL_BASED_OUTPATIENT_CLINIC_OR_DEPARTMENT_OTHER): Payer: BC Managed Care – PPO

## 2014-02-22 VITALS — BP 109/65 | HR 79 | Temp 98.0°F | Resp 20

## 2014-02-22 DIAGNOSIS — D751 Secondary polycythemia: Secondary | ICD-10-CM

## 2014-02-22 LAB — CBC WITH DIFFERENTIAL/PLATELET
BASO%: 0 % (ref 0.0–2.0)
BASOS ABS: 0 10*3/uL (ref 0.0–0.1)
EOS%: 0 % (ref 0.0–7.0)
Eosinophils Absolute: 0 10*3/uL (ref 0.0–0.5)
HCT: 47.3 % (ref 38.4–49.9)
HGB: 15.9 g/dL (ref 13.0–17.1)
LYMPH#: 1.5 10*3/uL (ref 0.9–3.3)
LYMPH%: 32.9 % (ref 14.0–49.0)
MCH: 31.4 pg (ref 27.2–33.4)
MCHC: 33.6 g/dL (ref 32.0–36.0)
MCV: 93.5 fL (ref 79.3–98.0)
MONO#: 0.5 10*3/uL (ref 0.1–0.9)
MONO%: 9.7 % (ref 0.0–14.0)
NEUT#: 2.7 10*3/uL (ref 1.5–6.5)
NEUT%: 57.4 % (ref 39.0–75.0)
Platelets: 165 10*3/uL (ref 140–400)
RBC: 5.06 10*6/uL (ref 4.20–5.82)
RDW: 12.8 % (ref 11.0–14.6)
WBC: 4.6 10*3/uL (ref 4.0–10.3)

## 2014-02-22 NOTE — Progress Notes (Signed)
[id number] grams removed from left ac without difficulty.  Pressure dressing applied.  Snack and drink taken.  1030-VS stable.  Pressure dressing clean, dry and intact to left ac.  Phlebotomy discharge instructions reviewed with pt.  Teach back done.

## 2014-02-22 NOTE — Patient Instructions (Signed)

## 2014-02-28 ENCOUNTER — Ambulatory Visit: Payer: BC Managed Care – PPO | Admitting: Psychology

## 2014-03-01 ENCOUNTER — Encounter
Payer: BC Managed Care – PPO | Attending: Physical Medicine & Rehabilitation | Admitting: Physical Medicine & Rehabilitation

## 2014-03-01 ENCOUNTER — Encounter: Payer: Self-pay | Admitting: Hematology

## 2014-03-01 ENCOUNTER — Other Ambulatory Visit: Payer: Self-pay | Admitting: Physical Medicine & Rehabilitation

## 2014-03-01 ENCOUNTER — Encounter: Payer: Self-pay | Admitting: Physical Medicine & Rehabilitation

## 2014-03-01 VITALS — BP 121/80 | HR 84 | Resp 14

## 2014-03-01 DIAGNOSIS — F418 Other specified anxiety disorders: Secondary | ICD-10-CM | POA: Insufficient documentation

## 2014-03-01 DIAGNOSIS — Z79891 Long term (current) use of opiate analgesic: Secondary | ICD-10-CM | POA: Diagnosis not present

## 2014-03-01 DIAGNOSIS — D751 Secondary polycythemia: Secondary | ICD-10-CM | POA: Diagnosis not present

## 2014-03-01 DIAGNOSIS — X58XXXA Exposure to other specified factors, initial encounter: Secondary | ICD-10-CM | POA: Diagnosis not present

## 2014-03-01 DIAGNOSIS — L4050 Arthropathic psoriasis, unspecified: Secondary | ICD-10-CM | POA: Diagnosis not present

## 2014-03-01 DIAGNOSIS — Z79899 Other long term (current) drug therapy: Secondary | ICD-10-CM | POA: Diagnosis not present

## 2014-03-01 DIAGNOSIS — Z5181 Encounter for therapeutic drug level monitoring: Secondary | ICD-10-CM | POA: Diagnosis not present

## 2014-03-01 DIAGNOSIS — I69398 Other sequelae of cerebral infarction: Secondary | ICD-10-CM | POA: Diagnosis not present

## 2014-03-01 DIAGNOSIS — S43002A Unspecified subluxation of left shoulder joint, initial encounter: Secondary | ICD-10-CM | POA: Diagnosis not present

## 2014-03-01 DIAGNOSIS — Z7901 Long term (current) use of anticoagulants: Secondary | ICD-10-CM | POA: Diagnosis not present

## 2014-03-01 DIAGNOSIS — I69320 Aphasia following cerebral infarction: Secondary | ICD-10-CM | POA: Insufficient documentation

## 2014-03-01 DIAGNOSIS — G8100 Flaccid hemiplegia affecting unspecified side: Secondary | ICD-10-CM

## 2014-03-01 DIAGNOSIS — I69351 Hemiplegia and hemiparesis following cerebral infarction affecting right dominant side: Secondary | ICD-10-CM | POA: Diagnosis not present

## 2014-03-01 DIAGNOSIS — Z86718 Personal history of other venous thrombosis and embolism: Secondary | ICD-10-CM | POA: Insufficient documentation

## 2014-03-01 DIAGNOSIS — Z87890 Personal history of sex reassignment: Secondary | ICD-10-CM | POA: Diagnosis not present

## 2014-03-01 DIAGNOSIS — G8101 Flaccid hemiplegia affecting right dominant side: Secondary | ICD-10-CM | POA: Insufficient documentation

## 2014-03-01 DIAGNOSIS — Z9181 History of falling: Secondary | ICD-10-CM | POA: Insufficient documentation

## 2014-03-01 DIAGNOSIS — D6859 Other primary thrombophilia: Secondary | ICD-10-CM | POA: Diagnosis not present

## 2014-03-01 DIAGNOSIS — K900 Celiac disease: Secondary | ICD-10-CM | POA: Diagnosis not present

## 2014-03-01 DIAGNOSIS — G894 Chronic pain syndrome: Secondary | ICD-10-CM | POA: Diagnosis not present

## 2014-03-01 DIAGNOSIS — M7522 Bicipital tendinitis, left shoulder: Secondary | ICD-10-CM | POA: Diagnosis not present

## 2014-03-01 DIAGNOSIS — M13862 Other specified arthritis, left knee: Secondary | ICD-10-CM | POA: Insufficient documentation

## 2014-03-01 DIAGNOSIS — R209 Unspecified disturbances of skin sensation: Secondary | ICD-10-CM | POA: Diagnosis not present

## 2014-03-01 DIAGNOSIS — Z862 Personal history of diseases of the blood and blood-forming organs and certain disorders involving the immune mechanism: Secondary | ICD-10-CM

## 2014-03-01 DIAGNOSIS — M545 Low back pain: Secondary | ICD-10-CM | POA: Insufficient documentation

## 2014-03-01 DIAGNOSIS — G8929 Other chronic pain: Secondary | ICD-10-CM | POA: Insufficient documentation

## 2014-03-01 MED ORDER — DICLOFENAC SODIUM 1 % TD GEL: 1.0000 "application " | Freq: Three times a day (TID) | TRANSDERMAL | Status: DC

## 2014-03-01 NOTE — Patient Instructions (Signed)
PLEASE CALL ME WITH ANY PROBLEMS OR QUESTIONS (#[phone]).     WE WILL CONTACT YOU REGARDING YOUR URINE RESULTS.  IF YOUR RESULTS ARE CONSISTENT WE CAN BEGIN MS CONTIN. I WILL DOSE IT 30MG Q8 HOURS.

## 2014-03-01 NOTE — Progress Notes (Signed)
Subjective:    Patient ID: Jesse Bowers, male    DOB: [birth year], 48 y.o.   MRN: [id number]  HPI   This is an initial evaluation for Jesse Bowers a pleasant 48yo wm who complains of chronic pain in his elbows, knees, feet, and lesser so the low back. His hx is significant for psoriatic arthritis.   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  His first clinical event appears to have taken place in May.  He is on xarelto chronically now for this. He has a hx of DVT's. He had an MRI in May which I don't have available, but I do have a report of an MRI from 2014 which revealed SVD and raised the questions of SVD/vasculitis vs MS.   He is currently on enbrel for his psoriatic arthritis which has generally controlled symptoms. He is taking ms contin for pain control. He hasn't PT for some time for his generalized pain, but he is currently receiving PT for his left shoulder which he subluxed after fall from August. He also had a fall in July where he broke his hand. His hx is also notable for polycythemia for which he is followed by [facility] and receives regular phlebotomies.   I reviewed an MRI of his knee which revealed predominantly patello-femoral disease/OA.   His pain is worst when he's inactive for long periods of time and also is severe when he is active for longer periods of time. He can walk 4-5 minutes with his cane before he wears out. He fell again in October when he caught his toe on a door threshold.   His physician family physician in [place] has been writing his pain medications since he moved from [place] until now. He had seen a pain clinic there for about 6 months prior to moving down here which placed him on MS contin AND methadone. He has been on methadone 26m in am and 121mat night as well as ms contin 3056mID. He has weaned these meds down substantially because of lack of supply. He really has never wanted to be on the methadone either.     He is having ongoing weakness in his right leg---the arm is not affected as much. He complains of sensory loss as well in the right leg predominantly. He uses a cane for gait.  ColKeymariceives testosterone injections for hx of transgender state.          Pain Inventory Average Pain 4 Pain Right Now 6 My pain is constant, sharp, dull, tingling and aching  In the last 24 hours, has pain interfered with the following? General activity 9 Relation with others 8 Enjoyment of life 8 What TIME of day is your pain at its worst? morning Sleep (in general) Poor  Pain is worse with: walking, sitting, inactivity and standing Pain improves with: rest, pacing activities, medication and injections Relief from Meds: 8  Mobility walk with assistance use a cane how many minutes can you walk? 2-3 ability to climb steps?  yes do you drive?  yes use a wheelchair  Function not employed: date last employed 05/05/2013 disabled: date disabled 03/12/12 I need assistance with the following:  feeding, dressing, bathing, meal prep, household duties and shopping Do you have any goals in this area?  yes  Neuro/Psych bladder control problems weakness numbness tingling trouble walking dizziness confusion anxiety  Prior Studies new visit  Physicians involved in your  care Primary care Dr. Loistine Simas Neurologist to be determined Psychiatrist Sharalyn Ink, [place] Rheumatologist Dr. Ladoris Gene Orthopedist Dr. Durward Fortes Psychologist Pervis Hocking, Phd new visit   Family History  Problem Relation Age of Onset  . Stroke Paternal Uncle     age 30  . Stroke Maternal Grandfather     22  . Hypertension Mother   . Psoriasis Mother   . Stroke Maternal Grandmother   . Congestive Heart Failure Maternal Grandmother   . Cancer Paternal Grandfather   . Protein C deficiency Sister 73    Miscarriages   History   Social History  . Marital Status: Married    Spouse Name: N/A    Number of Children:  2  . 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 Smoking status: Never Smoker   . Smokeless tobacco: Not on file  . Alcohol Use: Yes     Comment: wine once a month  . Drug Use: No  . Sexual Activity: Yes    Birth Control/ Protection: None     Comment: patient is a transgender on testosterone shots, no biological kids   Other Topics Concern  . Not on file   Social History Narrative   Education 4 year college, former Therapist, sports X 15 years, pediatric nurse practitioner x 6 years, did NP degree from [place] [employer] [place]. Relocated to [place] about 2 months ago from [place], [place]. Patient was in [place] for last 4 years and prior to that in [place]. His wife is working as Scientist, research (physical sciences) for [employer]. Patient is not working and applying for disability. They have 2 kids but no biologic children.    Past Surgical History  Procedure Laterality Date  . Thyroidectomy, partial  2008  . Cholecystectomy    . Ankle arthroscopy with reconstruction Right 2007  . Knee surgery Bilateral 1984    Right ACL, left PCL repair  . Liver biopsy  2013    normal results.  . Hip arthroscopy w/ labral repair Right 05/11/2013   Past Medical History  Diagnosis Date  . Anxiety   . psoriatic arthritis   . H/O protein C deficiency   . Hx-TIA (transient ischemic attack)   . Clotting disorder   . Celiac disease   . H/O parotitis     right   . Polycythemia, secondary   . Transgendered   . Sleep apnea   . Neck pain   . Abnormal weight loss   . Gluten enteropathy   . Syrinx of spinal cord 01/06/2014 on MRI    c spine   There were no vitals taken for this visit.  Opioid Risk Score:   Fall Risk Score:   Review of Systems  Constitutional: Positive for unexpected weight change.       Weight loss  Respiratory: Positive for apnea.   Gastrointestinal: Positive for constipation.  Genitourinary: Positive  for urgency and frequency.  Musculoskeletal: Positive for gait problem.  Neurological: Positive for dizziness, weakness and numbness.       Tingling  Psychiatric/Behavioral: Positive for confusion. The patient is nervous/anxious.   All other systems reviewed and are negative.      Objective:   Physical Exam   General: Alert and oriented x 3, No apparent distress HEENT: Head is normocephalic, atraumatic, PERRLA, EOMI, sclera anicteric, oral mucosa pink and moist, dentition intact, ext ear canals clear,  Neck: Supple without JVD or lymphadenopathy Heart: Reg rate and rhythm. No murmurs rubs or gallops Chest: CTA bilaterally without wheezes, rales, or rhonchi; no distress Abdomen: Soft, non-tender, non-distended, bowel sounds positive. Extremities: No clubbing, cyanosis, or edema. Pulses are 2+ Skin: Clean and intact without signs of breakdown. Plaque on the medial arch left foot Neuro: Pt is pleasant and alert and oriented x 3. He stutters and has word finding deficits. He is able to converse intelligently and has good insight and awareness. His short term memory is functional on a conversational level. Mild right facial weakness and sensory loss is appreciated. UES: 4+ to 5/5 LUE with some give away at the shoulder. RUE: 4- to 4 deltoid, bicep, tricep, wrist. Grip is weaker bilaterally due to pain. RLE: 3+ hf, 4- ke and ankle df. LE: 4+/5. Sensory loss 1+/2 Right face, hand, and leg. He walks with a steppage gait and also circumducts the leg to assist with clearance of the foot.  Musculoskeletal:  He has pain with IR and impingement maneuvers of the left shoulder. Apprehension test +, Both bicep tendons were MOST TENDER. Speeds test +. He has weaker grip due to pain in both hands. No joint deformities appreciated in hands. Knees with mild crepitus. no effusion. Did not appear overly antalgic on either knee. Pt with numerous scars on the right foot due to prior surgeries. Arch appears generally  preserved. No gross foot deformities. He has general tenderness to palpation over the feet, knees. Right lumbar paraspinals are tight and more tender with palpation. His low back is slightly rotated clockwise at baseline. Head position is forward and somewhat slumped.  Psych: Pt's affect is appropriate. Pt is cooperative        Assessment & Plan:  1. Psoriatic arthritis with pain in multiple areas, most prominently feet, hands, elbows. 2. 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 3. Patello-femoral arthritis left knee 4. Chronic low back pain---likely related to altered gait pattern from CVATestosterone deficiency 5. Protein C deficiency 6. Left shoulder subluxation, bicipital tendonitis 7. Central sleep apnea 8. Polycythemia 9. Depression with anxiety  Plan: 1. I am willing to treat Jesse Bowers' chronic pain. I will resume his MS contin at 9m TID after we receive a consistent UDS.  We will work on weaning him off of the methadone. He may not need another rx for this. 2. Would like to get him into neuro-rehab to address gait technique and balance which should help some of his secondary pain problems as well 3. Voltaren gel to hands, feet, knees, elbows if ok with H/O 4. Consider adaptive orthotics for balance, support.  Recommend the use of high topped shoes to improve ankle support. 5. Ice and conservative mgt to left shoulder. Recommended that PT address his biceps tendons. Use ice and rest also. Consider injection 6. Follow up with me in about a month. Forty-five minutes of face to face patient care time were spent during this visit. All questions were encouraged and answered.

## 2014-03-02 LAB — PMP ALCOHOL METABOLITE (ETG): Ethyl Glucuronide (EtG): NEGATIVE ng/mL

## 2014-03-06 LAB — OPIATES/OPIOIDS (LC/MS-MS)
Codeine Urine: NEGATIVE ng/mL (ref <50)
Hydrocodone: NEGATIVE ng/mL (ref <50)
Hydromorphone: 128 ng/mL (ref <50)
Morphine Urine: 41567 ng/mL (ref <50)
Norhydrocodone, Ur: NEGATIVE ng/mL (ref <50)
Noroxycodone, Ur: NEGATIVE ng/mL (ref <50)
Oxycodone, ur: NEGATIVE ng/mL (ref <50)
Oxymorphone: NEGATIVE ng/mL (ref <50)

## 2014-03-06 LAB — METHADONE (GC/LC/MS), URINE
EDDPUC: 3556 ng/mL (ref <100)
Methadone (GC/LC/MS), ur confirm: 3180 ng/mL (ref <100)

## 2014-03-06 NOTE — Telephone Encounter (Signed)
Awaiting response from Provider or collaborative nurse if provider has notified her of previous request.

## 2014-03-07 LAB — PRESCRIPTION MONITORING PROFILE (SOLSTAS)
Amphetamine/Meth: NEGATIVE ng/mL
BUPRENORPHINE, URINE: NEGATIVE ng/mL
Barbiturate Screen, Urine: NEGATIVE ng/mL
Benzodiazepine Screen, Urine: NEGATIVE ng/mL
CANNABINOID SCRN UR: NEGATIVE ng/mL
CREATININE, URINE: 238.67 mg/dL (ref >20.0)
Carisoprodol, Urine: NEGATIVE ng/mL
Cocaine Metabolites: NEGATIVE ng/mL
ECSTASY: NEGATIVE ng/mL
FENTANYL URINE: NEGATIVE ng/mL
Meperidine, Ur: NEGATIVE ng/mL
Nitrites, Initial: NEGATIVE ug/mL
Oxycodone Screen, Ur: NEGATIVE ng/mL
Propoxyphene: NEGATIVE ng/mL
Tapentadol, urine: NEGATIVE ng/mL
Tramadol Scrn, Ur: NEGATIVE ng/mL
Zolpidem, Urine: NEGATIVE ng/mL
pH, Initial: 5.6 pH (ref 4.5–8.9)

## 2014-03-09 NOTE — Progress Notes (Signed)
Urine drug screen for this encounter is consistent for prescribed medication 

## 2014-03-13 ENCOUNTER — Encounter: Payer: Self-pay | Admitting: Physical Medicine & Rehabilitation

## 2014-03-16 ENCOUNTER — Telehealth: Payer: Self-pay | Admitting: *Deleted

## 2014-03-16 NOTE — Telephone Encounter (Signed)
Patient is calling about his UDS results and for his next prescription.

## 2014-03-16 NOTE — Telephone Encounter (Signed)
Pt calling about his pending script for Morphine, pt says script is dependent upon UDS results, pt is running out of meds, hoping to get refill soon

## 2014-03-17 MED ORDER — MORPHINE SULFATE ER 30 MG PO TBCR: 30.0000 mg | EXTENDED_RELEASE_TABLET | Freq: Three times a day (TID) | ORAL | Status: DC

## 2014-03-17 NOTE — Telephone Encounter (Signed)
i have ordered ms contin 51m q8 hours as we discussed. Does he need more methadone or has he come off of it since we met?  If he's still on it, i need his current dosing schedule

## 2014-03-17 NOTE — Telephone Encounter (Signed)
Attempted to reach but no answer. Left message to call office back.

## 2014-03-17 NOTE — Telephone Encounter (Signed)
Jesse Bowers is no longer on methadone.  I informed him his rx is available for pick up.

## 2014-03-21 ENCOUNTER — Ambulatory Visit (HOSPITAL_COMMUNITY): Payer: BC Managed Care – PPO | Admitting: Psychiatry

## 2014-03-21 ENCOUNTER — Ambulatory Visit (INDEPENDENT_AMBULATORY_CARE_PROVIDER_SITE_OTHER): Payer: BLUE CROSS/BLUE SHIELD | Admitting: Psychology

## 2014-03-21 DIAGNOSIS — F4310 Post-traumatic stress disorder, unspecified: Secondary | ICD-10-CM

## 2014-03-23 ENCOUNTER — Telehealth: Payer: Self-pay | Admitting: Hematology

## 2014-03-23 ENCOUNTER — Ambulatory Visit: Payer: BLUE CROSS/BLUE SHIELD

## 2014-03-23 ENCOUNTER — Ambulatory Visit (HOSPITAL_BASED_OUTPATIENT_CLINIC_OR_DEPARTMENT_OTHER): Payer: BLUE CROSS/BLUE SHIELD | Admitting: Hematology

## 2014-03-23 ENCOUNTER — Encounter: Payer: Self-pay | Admitting: Hematology

## 2014-03-23 ENCOUNTER — Other Ambulatory Visit (HOSPITAL_BASED_OUTPATIENT_CLINIC_OR_DEPARTMENT_OTHER): Payer: BLUE CROSS/BLUE SHIELD

## 2014-03-23 ENCOUNTER — Ambulatory Visit (HOSPITAL_BASED_OUTPATIENT_CLINIC_OR_DEPARTMENT_OTHER): Payer: BLUE CROSS/BLUE SHIELD

## 2014-03-23 VITALS — BP 112/83 | HR 97 | Temp 99.4°F | Resp 18 | Ht 68.0 in | Wt 195.5 lb

## 2014-03-23 DIAGNOSIS — D751 Secondary polycythemia: Secondary | ICD-10-CM

## 2014-03-23 DIAGNOSIS — R634 Abnormal weight loss: Secondary | ICD-10-CM

## 2014-03-23 DIAGNOSIS — D6859 Other primary thrombophilia: Secondary | ICD-10-CM

## 2014-03-23 DIAGNOSIS — Z86718 Personal history of other venous thrombosis and embolism: Secondary | ICD-10-CM

## 2014-03-23 DIAGNOSIS — Z7982 Long term (current) use of aspirin: Secondary | ICD-10-CM

## 2014-03-23 DIAGNOSIS — I82409 Acute embolism and thrombosis of unspecified deep veins of unspecified lower extremity: Secondary | ICD-10-CM

## 2014-03-23 LAB — CBC WITH DIFFERENTIAL/PLATELET
BASO%: 0.2 % (ref 0.0–2.0)
BASOS ABS: 0 10*3/uL (ref 0.0–0.1)
EOS%: 0 % (ref 0.0–7.0)
Eosinophils Absolute: 0 10*3/uL (ref 0.0–0.5)
HCT: 46.5 % (ref 38.4–49.9)
HEMOGLOBIN: 15 g/dL (ref 13.0–17.1)
LYMPH%: 15.7 % (ref 14.0–49.0)
MCH: 30.4 pg (ref 27.2–33.4)
MCHC: 32.3 g/dL (ref 32.0–36.0)
MCV: 93.9 fL (ref 79.3–98.0)
MONO#: 0.3 10*3/uL (ref 0.1–0.9)
MONO%: 5.9 % (ref 0.0–14.0)
NEUT%: 78.2 % — AB (ref 39.0–75.0)
NEUTROS ABS: 4.3 10*3/uL (ref 1.5–6.5)
PLATELETS: 175 10*3/uL (ref 140–400)
RBC: 4.95 10*6/uL (ref 4.20–5.82)
RDW: 12.7 % (ref 11.0–14.6)
WBC: 5.4 10*3/uL (ref 4.0–10.3)
lymph#: 0.9 10*3/uL (ref 0.9–3.3)

## 2014-03-23 NOTE — Progress Notes (Signed)
[facility] FOLLOW UP NOTE DATE OF VISIT: 01/09/2014  Patient Care Team: Jesse Nip, MD as PCP - General (Family Medicine) Jesse Bowers Roe, MD as Consulting Physician (Hematology) Jesse Nip, MD as Referring Physician (Family Medicine)  CHIEF COMPLAINTS Follow up DVT and polycythemia  HISTORY OF INITIAL PRESENTING ILLNESS:   Jesse Bowers 49 y.o. 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 Patient is a former Software engineer. Patient had a fractured foot repair in 2007 and caused a right leg DVT. At the time he took Coumadin for 3 months.   In May of this year 2015 patient presented with right-sided weakness and slurring of speech, he felt tired, had some nausea and was having trouble gripping the soap when he was in the shower. He checked himself in the mirror there was no facial droop but based on the symptoms he texted his wife took him to the hospital where he was kept overnight. His symptoms of TIA lasted for more than 24 hours and he had appropriate neuro imaging done. When he was seen by a neurologist later, he was told that he had a left frontal stroke. This happened at a young age of 38 so his primary care physician ordered hypercoagulable panel and that showed a protein C deficiency. This TIA or stroke event took place on 07/27/2013 an MRI brain from that date showed changes of early chronic small vessel ischemic changes. A note from the previous hematologist Jesse Bowers in [facility] indicated that at [facility] they also felt that it was not a stroke. Carotid ultrasound showed minimal atherosclerotic plaques. The hypercoagulable panel was drawn on 08/19/2013 and he was negative for factor V Leiden mutation, prothrombin gene mutation. He had normal levels of antithrombin III and protein S but the protein C level was decreased at 47%(reference range is 70-180%). A repeat  protein C activity done on 09/28/2013 was 58% still low. Lupus anticoagulant was negative. Beta-2 glycoprotein antibodies were negative.  Patient is currently receiving a baby aspirin for last 3 months. The hematologist in [place] Jesse Bowers also gave him some Lovenox injections for prophylactic use just before a long distance travel. He encouraged him to avoid inactivity/immobility. Patient was not given an anticoagulant such as warfarin or the new generation drugs like Xarelto or Eliquis.  On 04/28/2013 patient's CBC showed a white count of 6.2 hemoglobin 18.7 hematocrit 53.1 MCV 92 platelet count of 207. Differential was normal. Chemistry panel was normal creatinine 1.23. Patient does have history of elevated liver enzymes in the past and the etiology of that is not clear likely medication related. He had liver biopsy done in 2013 which was reportedly normal. Patient was taking Remicade for over an year and it was helping his skin lesions of psoriasis but not the joint pain. It was switched to Enbrel which he is still taking a subcutaneous shot once a week that is every Sunday.  Patient also have some pain control issues and was seeing a pain specialist in [place]. He is in the process of getting a referral to a pain specialist here. His pain medications include morphine extended release, oxycodone, occasional naproxen and methadone. Patient is also on antidepressants.  Patient also had a clot at PICC line for antibiotics in 2006. Patient had history of psoriasis and psoriatic arthritis being managed by a rheumatologist in [place]. Now he is shifting his care to a local rheumatologist here. Most of his  psoriasis involves the bottom of the feet mostly on the left side, hands, knees and ears.  Patient also had a history of right-sided parotitis documented in May 2015 that was treated with antibiotics. He does have chronic dry mouth because of sleep apnea as well as his antidepressant  therapy.  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 That itself can increase the risk for both arterial as well as venous thromboembolic events. 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 For his depression patient was on Prozac in March 2015 but it was causing decreased sexual libido and changed to Wellbutrin in March 2015. He does have mild constipation with the use of narcotics and takes Colace twice a day.  Patient is also complaining of 50 pound weight loss in the last 3-4 months. He thinks it is unintentional. He has been on immunosuppressant medication such as Remicade and Enbrel and sometimes those can be associated with possibility of non-Hodgkin lymphomas. He also have chronic pain and recently saw an orthopedic physician who recommended MRI of the neck which will be scheduled. He is also seeing a rheumatologist next month, a neurologist and a pain specialist. He does complain of left shoulder pain and left elbow pain. There was some discussion by previous rheumatologist in [place] about switching his biological therapy boost to Stelara or [place] Nail once he is evaluated by a rheumatologist. Patient had already received his flu shot as well as pneumonia shot. He also complains of having significant short-term memory problems and trouble concentrating which could be from his medications or TIA episode.  So patient's active hematologic problems are  #1 Protein C deficiency documented twice. #2 TIA/CVA in May 2015 #3 history of DVT in 2007 Right leg treated with Coumadin x 3 months. It was provoked by foot injury #3 Use of immunosuppressant medications which increase the likelihood of lymphomas. 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   INTERVAL HISTORY: Jesse Bowers returns for  follow-up. He was last seen by Jesse. Lona Bowers 2 months ago. He lost about 6 pounds in the past 2 months. He feels well overall, no new complaints. No ED visit or hospitalization no other major medical events in the past 2 months. He has not had testosterone injection in the past few months. His speech is slightly slurring, but stable per himself.   MEDICAL HISTORY:   Past Medical History  Diagnosis Date  . Anxiety   . psoriatic arthritis   . H/O protein C deficiency   . Hx-TIA (transient ischemic attack)   . Clotting disorder   . Celiac disease   . H/O parotitis     right   . Polycythemia, secondary   . Transgendered   . Sleep apnea   . Neck pain   . Abnormal weight loss   . Gluten enteropathy   . Syrinx of spinal cord 01/06/2014 on MRI    c spine    SURGICAL HISTORY:  Past Surgical History  Procedure Laterality Date  . Thyroidectomy, partial  2008  . Cholecystectomy    . Ankle arthroscopy with reconstruction Right 2007  . Knee surgery Bilateral 1984    Right ACL, left PCL repair  . Liver biopsy  2013    normal results.  . Hip arthroscopy w/ labral repair Right 05/11/2013    SOCIAL HISTORY:  History  Social History  . Marital Status: Married    Spouse Name: N/A    Number of Children: 2  . 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 Smoking status: Never Smoker   . Smokeless tobacco: Not on file  . Alcohol Use: Yes     Comment: wine once a month  . Drug Use: No  . Sexual Activity: Yes    Birth Control/ Protection: None     Comment: patient is a transgender on testosterone shots, no biological kids   Other Topics Concern  . 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 Relocated to [place] about 2 months ago from [place], [place].  Patient was in [place] for last 4 years and prior to that in [place]. His wife is working as Scientist, research (physical sciences) for [employer]. Patient is not working and applying for disability. They have 2 kids but no biologic children.     FAMILY HISTORY:  Family History  Problem Relation Age of Onset  . Stroke Paternal Uncle     age 46  . Stroke Maternal Grandfather     32  . Hypertension Mother   . Psoriasis Mother   . Stroke Maternal Grandmother   . Congestive Heart Failure Maternal Grandmother   . Cancer Paternal Grandfather   . Protein C deficiency Sister 68    Miscarriages    ALLERGIES:  is allergic to penicillin g; vancomycin; duloxetine; gabapentin; and sulfa antibiotics.  MEDICATIONS:  Current Outpatient Prescriptions  Medication Sig Dispense Refill  . busPIRone (BUSPAR) 15 MG tablet Take 15 mg by mouth 2 (two) times daily.    . clobetasol ointment (TEMOVATE) 0.05 % Apply topically daily. For psoriasis    . diclofenac sodium (VOLTAREN) 1 % GEL Apply 1 application topically 3 (three) times daily. To feet, hands, knees, elbows 3 Tube 4  . etanercept (ENBREL) 50 MG/ML injection Inject into the skin once a week.    . morphine (MS CONTIN) 30 MG 12 hr tablet Take 1 tablet (30 mg total) by mouth every 8 (eight) hours. 90 tablet 0  . rivaroxaban (XARELTO) 20 MG TABS tablet Take 1 tablet (20 mg total) by mouth daily with supper. 30 tablet 3  . sennosides-docusate sodium (SENOKOT-S) 8.6-50 MG tablet Take 1 tablet by mouth daily.    [name] testosterone cypionate (DEPOTESTOTERONE CYPIONATE) 200 MG/ML injection Inject into the muscle once a week.    [name] buPROPion (WELLBUTRIN SR) 150 MG 12 hr tablet Take 150 mg by mouth daily.     No current facility-administered medications for this visit.    REVIEW OF SYSTEMS:   Constitutional: Denies fevers, chills or abnormal night sweats Eyes: Denies blurriness of vision, double vision or watery eyes Ears, nose, mouth, throat, and face: Denies mucositis or sore throat,  +Dry mouth Respiratory: Denies cough, dyspnea or wheezes Cardiovascular: Denies palpitation, chest discomfort or lower extremity swelling Gastrointestinal:  Denies nausea, heartburn, + change in bowel habits that is Constipation Skin: Hx of psoriasis and arthropathy from it.  Lymphatics: Denies new lymphadenopathy or easy bruising, hx of right parotitis twice. Neurological:Denies numbness, tingling or new weaknesses, recent TIA March 2015 Behavioral/Psych: Mood is stable, no new changes, +depression and anxiety, +memory problems All other systems were  reviewed with the patient and are negative.  PHYSICAL EXAMINATION: ECOG PERFORMANCE STATUS: 0  Filed Vitals:   03/23/14 1022  BP: 112/83  Pulse: 97  Temp: 99.4 F (37.4 C)  Resp: 18   Filed Weights   03/23/14 1022  Weight: 195 lb 8 oz (88.678 kg)    GENERAL:alert, no distress and comfortable SKIN: skin color, texture, turgor are normal, no rashes or significant lesions EYES: normal, conjunctiva are pink and non-injected, sclera clear OROPHARYNX:no exudate, no erythema and lips, buccal mucosa, and tongue normal  NECK: supple, thyroid normal size, non-tender, without nodularity LYMPH:  no palpable lymphadenopathy in the cervical, axillary or inguinal LUNGS: clear to auscultation and percussion with normal breathing effort HEART: regular rate & rhythm and no murmurs and no lower extremity edema ABDOMEN:abdomen soft, non-tender and normal bowel sounds Musculoskeletal:no cyanosis of digits and no clubbing  PSYCH: alert & oriented x 3 with fluent speech NEURO: no focal motor/sensory deficits  LABORATORY DATA:  I have reviewed the data as listed CBC Latest Ref Rng 03/23/2014 02/22/2014 01/24/2014  WBC 4.0 - 10.3 10e3/uL 5.4 4.6 5.0  Hemoglobin 13.0 - 17.1 g/dL 15.0 15.9 16.7  Hematocrit 38.4 - 49.9 % 46.5 47.3 49.8  Platelets 140 - 400 10e3/uL 175 165 170      RADIOGRAPHIC STUDIES: No results found.  ASSESSMENT & PLAN:    1. Polythythemia JAK2 (-) -He had emoglobin 18.7 hematocrit 53.1 in 04/2013. Testosterone injection most likely contributes, but I think he still needs to ruled out myeloproliferative neoplasm especially polycythemia vera doing his very high hemoglobin level. Negative JAK2 mutation IS not supportive for PV, I'll check his able level. -I also recommend to have a bone marrow biopsy, he agrees. I'll arrange this through interventional radiology. -His hemoglobin and hematocrit was 15 and 46.5 today, will have phlebotomy 1 unit today.  2. History of DVT and stroke, Protein C deficiency  -Jesse. Lona Bowers started him on Xarelto, his neurologist also feels he needs anticoagulation due to his history of stroke.  -I think is reasonable to continue Xarelto given his underlying protein C deficiency and history of DVT and stroke.   3. History of CVA -His primary care physician is setting up a referral to a neurologist at [facility]. He will follow  4. He the chest or neck male, on testosterone injection -He has been off testosterone injection for a few months. I'll check his total and free testosterone level today. -I'll ask him to follow-up with his primary care physician or an endocrinologist to manage his testosterone injection dosage.  -We discussed that excessive testosterone would increase the risk of thrombosis and worsen his polycythemia. I prefer him to maintain low normal level of testosterone. He agrees.   PLAN: -Continue Xarelto -will check your epo and testosterone levels today -bone marrow biopsy through IR  - RTC in 5 weeks   All questions were answered. The patient knows to call the clinic with any problems, questions or concerns. I spent 20 minutes counseling the patient face to face. The total time spent in the appointment was 25 minutes and more than 50% was on counseling.   [name]  03/23/2014

## 2014-03-23 NOTE — Telephone Encounter (Signed)
Patient recieved appointments through [facility]. Patient didn't want AVS.

## 2014-03-23 NOTE — Patient Instructions (Signed)

## 2014-03-23 NOTE — Progress Notes (Signed)
Therapeutic phlebotomy performed 18 g L antecubital. 540 gm blood removed from 1213 to 1235 pm. Pt tolerated well. Snack taken and 30 min observation completed.

## 2014-03-24 ENCOUNTER — Other Ambulatory Visit: Payer: Self-pay | Admitting: Hematology

## 2014-03-25 ENCOUNTER — Encounter: Payer: Self-pay | Admitting: Hematology

## 2014-03-27 LAB — TESTOSTERONE, FREE, TOTAL, SHBG
Sex Hormone Binding: 35 nmol/L (ref 10–50)
Testosterone, Free: 124.6 pg/mL (ref 47.0–244.0)
Testosterone-% Free: 2.1 % (ref 1.6–2.9)
Testosterone: 597 ng/dL (ref 300–890)

## 2014-03-27 LAB — ERYTHROPOIETIN: Erythropoietin: 17 m[IU]/mL (ref 2.6–18.5)

## 2014-03-28 ENCOUNTER — Ambulatory Visit (INDEPENDENT_AMBULATORY_CARE_PROVIDER_SITE_OTHER): Payer: BLUE CROSS/BLUE SHIELD | Admitting: Psychology

## 2014-03-28 ENCOUNTER — Encounter: Payer: Self-pay | Admitting: Neurology

## 2014-03-28 DIAGNOSIS — F4310 Post-traumatic stress disorder, unspecified: Secondary | ICD-10-CM

## 2014-03-30 ENCOUNTER — Encounter: Payer: Self-pay | Admitting: Hematology

## 2014-03-31 ENCOUNTER — Telehealth: Payer: Self-pay | Admitting: Hematology

## 2014-03-31 ENCOUNTER — Other Ambulatory Visit: Payer: Self-pay | Admitting: *Deleted

## 2014-03-31 NOTE — Telephone Encounter (Signed)
S/w pt confirming labs/ov per 01/29 POF.... Cherylann Banas

## 2014-04-03 ENCOUNTER — Encounter
Payer: BLUE CROSS/BLUE SHIELD | Attending: Physical Medicine & Rehabilitation | Admitting: Physical Medicine & Rehabilitation

## 2014-04-03 ENCOUNTER — Telehealth: Payer: Self-pay | Admitting: Hematology

## 2014-04-03 ENCOUNTER — Encounter: Payer: Self-pay | Admitting: Physical Medicine & Rehabilitation

## 2014-04-03 VITALS — BP 140/77 | HR 88 | Resp 14

## 2014-04-03 DIAGNOSIS — I69320 Aphasia following cerebral infarction: Secondary | ICD-10-CM | POA: Insufficient documentation

## 2014-04-03 DIAGNOSIS — G8100 Flaccid hemiplegia affecting unspecified side: Secondary | ICD-10-CM

## 2014-04-03 DIAGNOSIS — M545 Low back pain: Secondary | ICD-10-CM | POA: Insufficient documentation

## 2014-04-03 DIAGNOSIS — D751 Secondary polycythemia: Secondary | ICD-10-CM | POA: Insufficient documentation

## 2014-04-03 DIAGNOSIS — Z79899 Other long term (current) drug therapy: Secondary | ICD-10-CM | POA: Insufficient documentation

## 2014-04-03 DIAGNOSIS — S43002A Unspecified subluxation of left shoulder joint, initial encounter: Secondary | ICD-10-CM | POA: Diagnosis not present

## 2014-04-03 DIAGNOSIS — D6859 Other primary thrombophilia: Secondary | ICD-10-CM | POA: Diagnosis not present

## 2014-04-03 DIAGNOSIS — R209 Unspecified disturbances of skin sensation: Secondary | ICD-10-CM | POA: Diagnosis not present

## 2014-04-03 DIAGNOSIS — Z7901 Long term (current) use of anticoagulants: Secondary | ICD-10-CM | POA: Diagnosis not present

## 2014-04-03 DIAGNOSIS — G8929 Other chronic pain: Secondary | ICD-10-CM | POA: Diagnosis present

## 2014-04-03 DIAGNOSIS — M13862 Other specified arthritis, left knee: Secondary | ICD-10-CM | POA: Diagnosis not present

## 2014-04-03 DIAGNOSIS — Z79891 Long term (current) use of opiate analgesic: Secondary | ICD-10-CM | POA: Diagnosis not present

## 2014-04-03 DIAGNOSIS — M7522 Bicipital tendinitis, left shoulder: Secondary | ICD-10-CM

## 2014-04-03 DIAGNOSIS — F418 Other specified anxiety disorders: Secondary | ICD-10-CM | POA: Insufficient documentation

## 2014-04-03 DIAGNOSIS — Z87890 Personal history of sex reassignment: Secondary | ICD-10-CM | POA: Diagnosis not present

## 2014-04-03 DIAGNOSIS — K900 Celiac disease: Secondary | ICD-10-CM | POA: Insufficient documentation

## 2014-04-03 DIAGNOSIS — I69398 Other sequelae of cerebral infarction: Secondary | ICD-10-CM | POA: Diagnosis not present

## 2014-04-03 DIAGNOSIS — G8101 Flaccid hemiplegia affecting right dominant side: Secondary | ICD-10-CM

## 2014-04-03 DIAGNOSIS — I69351 Hemiplegia and hemiparesis following cerebral infarction affecting right dominant side: Secondary | ICD-10-CM | POA: Insufficient documentation

## 2014-04-03 DIAGNOSIS — L4050 Arthropathic psoriasis, unspecified: Secondary | ICD-10-CM | POA: Insufficient documentation

## 2014-04-03 DIAGNOSIS — Z9181 History of falling: Secondary | ICD-10-CM | POA: Diagnosis not present

## 2014-04-03 DIAGNOSIS — Z86718 Personal history of other venous thrombosis and embolism: Secondary | ICD-10-CM | POA: Diagnosis not present

## 2014-04-03 MED ORDER — MORPHINE SULFATE ER 30 MG PO TBCR: 30.0000 mg | EXTENDED_RELEASE_TABLET | Freq: Three times a day (TID) | ORAL | Status: DC

## 2014-04-03 NOTE — Telephone Encounter (Signed)
Confirm appointment for 02/10

## 2014-04-03 NOTE — Progress Notes (Signed)
Subjective:    Patient ID: Jesse Bowers, male    DOB: [birth year], 49 y.o.   MRN: [id number]  HPI   Jesse Bowers is back regarding his chronic pain. 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 He is exclusively on the ms contin which works for him. He has purchased high top boots which help support his ankles but he doesn't want to wear these all the times. The voltaren gel is helping his ankles.   He is still having pain at this left shoulder. Therapy has addressed the biceps tendon a bit, but he's still having pain there.   Pain Inventory Average Pain 4 Pain Right Now 7 My pain is sharp, stabbing and aching  In the last 24 hours, has pain interfered with the following? General activity 9 Relation with others 8 Enjoyment of life 8 What TIME of day is your pain at its worst? night Sleep (in general) Poor  Pain is worse with: walking, bending, inactivity and standing Pain improves with: rest, medication, TENS and injections Relief from Meds: 5  Mobility walk with assistance use a cane use a walker how many minutes can you walk? 2-5 ability to climb steps?  yes do you drive?  yes  Function disabled: date disabled 01/18/13 I need assistance with the following:  dressing, bathing, meal prep, household duties and shopping Do you have any goals in this area?  yes  Neuro/Psych bladder control problems weakness numbness tingling trouble walking confusion  Prior Studies Any changes since last visit?  yes nerve study  Physicians involved in your care Any changes since last visit?  yes   Family History  Problem Relation Age of Onset  . Stroke Paternal Uncle     age 51  . Stroke Maternal Grandfather     53  . Hypertension Mother   . Psoriasis Mother   . Stroke Maternal Grandmother   . Congestive Heart Failure Maternal Grandmother   . Cancer Paternal Grandfather   . Protein C deficiency Sister 76    Miscarriages   History   Social History  . Marital Status:  Married    Spouse Name: N/A    Number of Children: 2  . 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 Smoking status: Never Smoker   . Smokeless tobacco: None  . Alcohol Use: Yes     Comment: wine once a month  . Drug Use: No  . Sexual Activity: Yes    Birth Control/ Protection: None     Comment: patient is a transgender on testosterone shots, no biological kids   Other Topics Concern  . None   Social History Narrative   Education 4 year college, former Therapist, sports X 15 years, pediatric nurse practitioner x 6 years, did NP degree from [employer] [place]. Relocated to [place] about 2 months ago from [place], [place]. Patient was in [place] for last 4 years and prior to that in [place]. His wife is working as Scientist, research (physical sciences) for [employer]. Patient is not working and applying for disability. They have 2 kids but no biologic children.    Past Surgical History  Procedure Laterality Date  . Thyroidectomy, partial  2008  . Cholecystectomy    . Ankle arthroscopy with reconstruction Right 2007  . Knee surgery Bilateral 1984    Right ACL, left PCL repair  . Liver biopsy  2013    normal results.  . Hip arthroscopy w/ labral repair Right 05/11/2013   Past Medical History  Diagnosis Date  . Anxiety   . psoriatic arthritis   . H/O protein C deficiency   . Hx-TIA (transient ischemic attack)   . Clotting disorder   . Celiac disease   . H/O parotitis     right   . Polycythemia, secondary   . Transgendered   . Sleep apnea   . Neck pain   . Abnormal weight loss   . Gluten enteropathy   . Syrinx of spinal cord 01/06/2014 on MRI    c spine   BP 140/77 mmHg  Pulse 88  Resp 14  SpO2 100%  Opioid Risk Score:   Fall Risk Score: Moderate Fall Risk (6-13 points)  Review of Systems  Constitutional:       Weight gain  Respiratory: Positive for apnea.   Gastrointestinal: Positive for  constipation.  Genitourinary: Positive for urgency and frequency.  Musculoskeletal: Positive for gait problem.  Skin: Positive for rash.  Neurological: Positive for weakness and numbness.       Tingling  Psychiatric/Behavioral: Positive for confusion.  All other systems reviewed and are negative.      Objective:   Physical Exam  General: Alert and oriented x 3, No apparent distress  HEENT: Head is normocephalic, atraumatic, PERRLA, EOMI, sclera anicteric, oral mucosa pink and moist, dentition intact, ext ear canals clear,  Neck: Supple without JVD or lymphadenopathy  Heart: Reg rate and rhythm. No murmurs rubs or gallops  Chest: CTA bilaterally without wheezes, rales, or rhonchi; no distress  Abdomen: Soft, non-tender, non-distended, bowel sounds positive.  Extremities: No clubbing, cyanosis, or edema. Pulses are 2+  Skin: Clean and intact without signs of breakdown. Plaque on the medial arch left foot  Neuro: Pt is pleasant and alert and oriented x 3. He stutters and has word finding deficits. He is able to converse intelligently and has good insight and awareness. His short term memory is functional on a conversational level. Mild right facial weakness and sensory loss is appreciated. UES: 4+ to 5/5 LUE with some give away at the shoulder. RUE: 4- to 4 deltoid, bicep, tricep, wrist. Grip is weaker bilaterally due to pain. RLE: 3+ hf, 4- ke and ankle df. LE: 4+/5. Sensory loss 1+/2 Right face, hand, and leg. His gait is improved with better right leg swing and less steppage/circumduction due to his boot. much more stable overall.  Musculoskeletal: He has pain with IR and impingement maneuvers of the left shoulder. Apprehension test +, long head biceps tendon tender to touch. Speeds test +. He has weaker grip due to pain in both hands. No joint deformities appreciated in hands. Knees with mild crepitus. no effusion. Did not appear overly antalgic on either knee.Right lumbar paraspinals are  tight and more tender with palpation. His low back is slightly rotated clockwise at baseline. Head position is forward and somewhat slumped.  Psych: Pt's affect is appropriate. Pt is cooperative   Assessment & Plan:   1. Psoriatic arthritis with pain in multiple areas, most prominently feet, hands, elbows.  2. 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  3. Patello-femoral arthritis left knee  4. Chronic low back pain---likely related to altered gait pattern from CVATestosterone deficiency 5. Protein C deficiency  6. Left shoulder subluxation, bicipital tendonitis  7. Central sleep apnea  8. Polycythemia  9. Depression with anxiety    Plan:  1. Ms Contin 37m q8 was refilled #90..   2. Consider referral to neuro-rehab to address gait technique and balance which should help some of his secondary pain problems as well ---will wait for him to finish left shoulder rehab. 3. Continue voltaren gel to hands, feet, knees, elbows if ok with H/O  4. Made a referral to [facility] for carbon AFO RLE.  5. After informed consent and preparation of the skin with betadine and isopropyl alcohol, I injected 656m(1cc) of celestone and 4cc of 1% lidocaine around the right long head biceps tendon via anterior approach. Additionally, aspiration was performed prior to injection. The patient tolerated well, and no complications were encountered. Afterward the area was cleaned and dressed. Post- injection instructions were provided.  6. Follow up with me or NP in about a month. 25 minutes of face to face patient care time were spent during this visit. All questions were encouraged and answered.

## 2014-04-03 NOTE — Patient Instructions (Signed)
Biceps Tendon Tendinitis (Distal) with Rehab Tendinitis involves inflammation and pain over the affected tendon. The distal biceps tendon (near the elbow) is vulnerable to tendinitis. Distal biceps tendonitis is usually due to the bony bump near the elbow (bicipital tuberosity) causing increased friction over the tendon. The biceps tendon attaches the biceps muscle to one bone in the elbow and two in the shoulder. It is important for proper function of the elbow and for turning the palm upward (supination). SYMPTOMS   Pain, aching, tenderness, and sometimes warmth or redness over the front of the elbow.  Pain when bending the elbow or turning the palm up, using the wrist, especially if performed against resistance.  Crackling sound (crepitation) when the tendon or elbow is moved or touched. CAUSES  The symptoms of biceps tendonitis are due to inflammation of the tendon. Inflammation may be caused by:  Strain from sudden increase in amount or intensity of activity.  Direct blow or injury to the elbow (uncommon).  Overuse or repetitive elbow bending or wrist rotation, particularly when turning the palm up, or with elbow hyperextension. RISK INCREASES WITH:  Sports that involve contact or overhead arm activity (throwing sports, gymnastics, weightlifting, bodybuilding, rock climbing).  Heavy labor.  Poor strength and flexibility.  Failure to warm up properly before activity.  Injury to other structures of the elbow.  Restraint of the elbow. PREVENTION  Warm up and stretch properly before activity.  Allow time for recovery between activities.  Maintain physical fitness:  Strength, flexibility, and endurance.  Cardiovascular fitness.  Learn and use proper exercise technique. PROGNOSIS  With proper treatment, biceps tendon tendonitis is usually curable within 6 weeks.  RELATED COMPLICATIONS   Longer healing time if not properly treated or if not given enough time to  heal.  Chronically inflamed tendon that causes persistent pain with activity, that may progress to constant pain and potentially rupture of the tendon.  Recurring symptoms, especially if activity is resumed too soon, with overuse or with poor technique. TREATMENT  Treatment first involves ice and medicine to reduce pain and inflammation. Modify activities that cause pain, to reduce the chances of causing the condition to get worse. Strengthening and stretching exercises should be performed to promote proper use of the muscles of the elbow. These exercise may be performed at home or with a therapist. Other treatments may be given such as ultrasound or heat therapy. Surgery is usually not recommended.  MEDICATION  If pain medicine is needed, nonsteroidal anti-inflammatory medicines (aspirin and ibuprofen), or other minor pain relievers (acetaminophen), are often advised.  Do not take pain relieving medication for 7 days before surgery.  Prescription pain relievers may be given if your caregiver thinks they are needed. Use only as directed and only as much as you need. HEAT AND COLD:   Cold treatment (icing) should be applied for 10 to 15 minutes every 2 to 3 hours for inflammation and pain, and immediately after activity that aggravates your symptoms. Use ice packs or an ice massage.  Heat treatment may be used before performing stretching and strengthening activities prescribed by your caregiver, physical therapist, or athletic trainer. Use a heat pack or a warm water soak. SEEK MEDICAL CARE IF:   Symptoms get worse or do not improve in 2 weeks, despite treatment.  New, unexplained symptoms develop. (Drugs used in treatment may produce side effects.) EXERCISES  RANGE OF MOTION (ROM) AND STRETCHING EXERCISES - Biceps Tendon Tendinitis (Distal) These exercises may help you when beginning to  rehabilitate your injury. Your symptoms may go away with or without further involvement from your  physician, physical therapist, or athletic trainer. While completing these exercises, remember:   Restoring tissue flexibility helps normal motion to return to the joints. This allows healthier, less painful movement and activity.  An effective stretch should be held for at least 30 seconds.  A stretch should never be painful. You should only feel a gentle lengthening or release in the stretched tissue. STRETCH - Elbow Flexors   Lie on a firm bed or countertop on your back. Be sure that you are in a comfortable position which will allow you to relax your arm muscles.  Place a folded towel under your right / left upper arm, so that your elbow and shoulder are at the same height. Extend your arm; your elbow should not rest on the bed or towel.  Allow the weight of your hand to straighten your elbow. Keep your arm and chest muscles relaxed. Your caretaker may ask you to increase the intensity of your stretch by adding a small wrist or hand weight.  Hold for __________ seconds. You should feel a stretch on the inside of your elbow. Slowly return to the starting position. Repeat __________ times. Complete this exercise __________ times per day. RANGE OF MOTION - Supination, Active   Stand or sit with your elbows at your side. Bend your right / left elbow to 90 degrees.  Turn your palm upward until you feel a gentle stretch on the inside of your forearm.  Hold this position for __________ seconds. Slowly release and return to the starting position. Repeat __________ times. Complete this stretch __________ times per day.  RANGE OF MOTION - Pronation, Active   Stand or sit with your elbows at your side. Bend your right / left elbow to 90 degrees.  Turn your palm downward until you feel a gentle stretch on the top of your forearm.  Hold this position for __________ seconds. Slowly release and return to the starting position. Repeat __________ times. Complete this stretch __________ times per  day.  STRENGTHENING EXERCISES - Biceps Tendon Tendinitis (Distal) These exercises may help you when beginning to rehabilitate your injury. They may resolve your symptoms with or without further involvement from your physician, physical therapist or athletic trainer. While completing these exercises, remember:   Muscles can gain both the endurance and the strength needed for everyday activities through controlled exercises.  Complete these exercises as instructed by your physician, physical therapist or athletic trainer. Increase the resistance and repetitions only as guided.  You may experience muscle soreness or fatigue, but the pain or discomfort you are trying to eliminate should never get worse during these exercises. If this pain does get worse, stop and make sure you are following the directions exactly. If the pain is still present after adjustments, discontinue the exercise until you can discuss the trouble with your clinician. STRENGTH - Elbow Flexors, Isometric   Stand or sit upright on a firm surface. Place your right / left arm so that your hand is palm-up and at the height of your waist.  Place your opposite hand on top of your forearm. Gently push down as your right / left arm resists. Push as hard as you can with both arms without causing any pain or movement at your right / left elbow. Hold this stationary position for __________ seconds.  Gradually release the tension in both arms. Allow your muscles to relax completely before repeating. Repeat  __________ times. Complete this exercise __________ times per day. STRENGTH - Forearm Supinators   Sit with your right / left forearm supported on a table, keeping your elbow below shoulder height. Rest your hand over the edge, palm down.  Gently grip a hammer or a soup ladle.  Without moving your elbow, slowly turn your palm and hand upward to a "thumbs-up" position.  Hold this position for __________ seconds. Slowly return to the  starting position. Repeat __________ times. Complete this exercise __________ times per day.  STRENGTH - Forearm Pronators   Sit with your right / left forearm supported on a table, keeping your elbow below shoulder height. Rest your hand over the edge, palm up.  Gently grip a hammer or a soup ladle.  Without moving your elbow, slowly turn your palm and hand upward to a "thumbs-up" position.  Hold this position for __________ seconds. Slowly return to the starting position. Repeat __________ times. Complete this exercise __________ times per day.  STRENGTH - Elbow Flexors, Supinated  With good posture, stand or sit on a firm chair without armrests. Allow your right / left arm to rest at your side with your palm facing forward.  Holding a __________ weight, or gripping a rubber exercise band or tubing, bring your hand toward your shoulder.  Allow your muscles to control the resistance as your hand returns to your side. Repeat __________ times. Complete this exercise __________ times per day.  STRENGTH - Elbow Flexors, Neutral  With good posture, stand or sit on a firm chair without armrests. Allow your right / left arm to rest at your side with your thumb facing forward.  Holding a __________ weight, or gripping a rubber exercise band or tubing, bring your hand toward your shoulder.  Allow your muscles to control the resistance as your hand returns to your side. Repeat __________ times. Complete this exercise __________ times per day.  Document Released: 02/17/2005 Document Revised: 05/12/2011 Document Reviewed: 06/01/2008 [facility] Patient Information 2015 [place], [place]. This information is not intended to replace advice given to you by your health care provider. Make sure you discuss any questions you have with your health care provider.

## 2014-04-04 ENCOUNTER — Encounter: Payer: Self-pay | Admitting: Hematology

## 2014-04-04 ENCOUNTER — Ambulatory Visit (INDEPENDENT_AMBULATORY_CARE_PROVIDER_SITE_OTHER): Payer: BLUE CROSS/BLUE SHIELD | Admitting: Psychology

## 2014-04-04 ENCOUNTER — Encounter: Payer: Self-pay | Admitting: Physical Medicine & Rehabilitation

## 2014-04-04 ENCOUNTER — Other Ambulatory Visit: Payer: Self-pay | Admitting: Radiology

## 2014-04-04 ENCOUNTER — Encounter: Payer: Self-pay | Admitting: Neurology

## 2014-04-04 DIAGNOSIS — F4323 Adjustment disorder with mixed anxiety and depressed mood: Secondary | ICD-10-CM

## 2014-04-04 NOTE — Telephone Encounter (Signed)
Can we help him get this information together?   ---- See message----   Dr [name]    I see you in a mother month but am hoping you can help before then. I received a request today for a MD/NP completed narrative summary to answer the following questions r/t my PM&R aspect from my student loan prgm. They have discharged my loans based on my perm disability. Do you think you could get it by 04/20/14?    1) The exact current diagnoses     2) Current treatment for rehab/pain disorder (medications, injections, AFO, PT/OT, multiple visits,     3) Prognosis (I have to be unable to work to more than 5 years essentially)    4) How long condition is expected to last (lifetime with rehab, pain, chronic)    5) How your medical problem are limiting ability to perform all aspects of my profession & how it's impeding my overall ability to practice or be gainfully employed.     (I can provide more info here: memory, concentration, ADL limitations, mobility, poor sleep which contributes to fatigue, all of which interfere with ability to work and maintain employment)    Let me know. Jesse Bowers    Jesse Bowers

## 2014-04-05 ENCOUNTER — Other Ambulatory Visit: Payer: Self-pay | Admitting: Radiology

## 2014-04-05 ENCOUNTER — Encounter: Payer: Self-pay | Admitting: Physical Medicine & Rehabilitation

## 2014-04-05 DIAGNOSIS — R269 Unspecified abnormalities of gait and mobility: Secondary | ICD-10-CM

## 2014-04-06 ENCOUNTER — Ambulatory Visit (HOSPITAL_COMMUNITY)
Admission: RE | Admit: 2014-04-06 | Discharge: 2014-04-06 | Disposition: A | Discharge status: Home / Self Care | Payer: BLUE CROSS/BLUE SHIELD | Source: Ambulatory Visit | Attending: Hematology | Admitting: Hematology

## 2014-04-06 ENCOUNTER — Encounter (HOSPITAL_COMMUNITY): Payer: Self-pay

## 2014-04-06 DIAGNOSIS — D751 Secondary polycythemia: Secondary | ICD-10-CM | POA: Insufficient documentation

## 2014-04-06 LAB — PROTIME-INR
INR: 1.43 (ref 0.00–1.49)
Prothrombin Time: 17.6 seconds — ABNORMAL HIGH (ref 11.6–15.2)

## 2014-04-06 LAB — APTT: aPTT: 30 seconds (ref 24–37)

## 2014-04-06 LAB — CBC
HEMATOCRIT: 42.4 % (ref 39.0–52.0)
Hemoglobin: 13.9 g/dL (ref 13.0–17.0)
MCH: 30.2 pg (ref 26.0–34.0)
MCHC: 32.8 g/dL (ref 30.0–36.0)
MCV: 92.2 fL (ref 78.0–100.0)
Platelets: 191 10*3/uL (ref 150–400)
RBC: 4.6 MIL/uL (ref 4.22–5.81)
RDW: 12 % (ref 11.5–15.5)
WBC: 5.8 10*3/uL (ref 4.0–10.5)

## 2014-04-06 LAB — BONE MARROW EXAM

## 2014-04-06 MED ORDER — MIDAZOLAM HCL 2 MG/2ML IJ SOLN
INTRAMUSCULAR | Status: AC
  Filled 2014-04-06: qty 6

## 2014-04-06 MED ORDER — FENTANYL CITRATE 0.05 MG/ML IJ SOLN
INTRAMUSCULAR | Status: AC
  Filled 2014-04-06: qty 4

## 2014-04-06 MED ORDER — FENTANYL CITRATE 0.05 MG/ML IJ SOLN
INTRAMUSCULAR | Status: AC | PRN
Start: 2014-04-06 — End: 2014-04-06
  Administered 2014-04-06 (×2): 50 ug via INTRAVENOUS
  Administered 2014-04-06: 25 ug via INTRAVENOUS

## 2014-04-06 MED ORDER — SODIUM CHLORIDE 0.9 % IV SOLN
INTRAVENOUS | Status: DC
  Administered 2014-04-06: 07:00:00 via INTRAVENOUS

## 2014-04-06 MED ORDER — MIDAZOLAM HCL 2 MG/2ML IJ SOLN
INTRAMUSCULAR | Status: AC | PRN
  Administered 2014-04-06 (×3): 1 mg via INTRAVENOUS

## 2014-04-06 NOTE — H&P (Signed)
Chief Complaint: "I'm having a bone marrow biopsy"  Referring Physician(s): Feng,Yan  History of Present Illness: Jesse Bowers is a 49 y.o. 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   Past Medical History  Diagnosis Date  . psoriatic arthritis   . H/O protein C deficiency   . Hx-TIA (transient ischemic attack)   . Clotting disorder   . Celiac disease   . H/O parotitis     right   . Polycythemia, secondary   . Transgendered   . Sleep apnea   . Neck pain   . Abnormal weight loss   . Gluten enteropathy   . Syrinx of spinal cord 01/06/2014 on MRI    c spine  . Anxiety     PTSD per patient    Past Surgical History  Procedure Laterality Date  . Thyroidectomy, partial  2008  . Cholecystectomy    . Ankle arthroscopy with reconstruction Right 2007  . Knee surgery Bilateral 1984    Right ACL, left PCL repair  . Liver biopsy  2013    normal results.  . Hip arthroscopy w/ labral repair Right 05/11/2013    Allergies: Penicillin g; Vancomycin; Duloxetine; Gabapentin; and Sulfa antibiotics  Medications: Prior to Admission medications   Medication Sig Start Date End Date Taking? Authorizing Provider  buPROPion (WELLBUTRIN SR) 150 MG 12 hr tablet Take 150 mg by mouth daily.   Yes Historical Provider, MD  busPIRone (BUSPAR) 15 MG tablet Take 30 mg by mouth 2 (two) times daily.    Yes Historical Provider, MD  diclofenac sodium (VOLTAREN) 1 % GEL Apply 1 application topically 3 (three) times daily. To feet, hands, knees, elbows 03/01/14  Yes Meredith Staggers, MD  etanercept (ENBREL) 50 MG/ML injection Inject into the skin once a week. 09/28/13  Yes Historical Provider, MD  morphine (MS CONTIN) 30 MG 12 hr tablet Take 1 tablet (30 mg total) by mouth every 8 (eight) hours. 04/03/14  Yes Meredith Staggers, MD  prazosin (MINIPRESS) 1 MG capsule Take 2 mg by mouth  at bedtime.    Yes Historical Provider, MD  rivaroxaban (XARELTO) 20 MG TABS tablet Take 1 tablet (20 mg total) by mouth daily with supper. 01/20/14  Yes Drue Second, NP  sennosides-docusate sodium (SENOKOT-S) 8.6-50 MG tablet Take 1 tablet by mouth daily.   Yes Historical Provider, MD  testosterone cypionate (DEPOTESTOTERONE CYPIONATE) 200 MG/ML injection Inject into the muscle once a week. 08/29/13   Historical Provider, MD    Family History  Problem Relation Age of Onset  . Stroke Paternal Uncle     age 72  . Stroke Maternal Grandfather     53  . Hypertension Mother   . Psoriasis Mother   . Stroke Maternal Grandmother   . Congestive Heart Failure Maternal Grandmother   . Cancer Paternal Grandfather   . Protein C deficiency Sister 75    Miscarriages    History   Social History  . Marital Status: Married    Spouse Name: N/A    Number of Children: 2  . 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 Smoking status: Never Smoker   . Smokeless tobacco: None  . Alcohol Use: Yes     Comment: wine once  a month  . Drug Use: No  . Sexual Activity: Yes    Birth Control/ Protection: None     Comment: patient is a transgender on testosterone shots, no biological kids   Other Topics Concern  . 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 Relocated to [place] about 2 months ago from [place], [place]. Patient was in [place] for last 4 years and prior to that in [place]. His wife is working as Scientist, research (physical sciences) for [employer]. Patient is not working and applying for disability. They have 2 kids but no biologic children.       Review of Systems  Constitutional: Positive for unexpected weight change. Negative for fever and chills.  Respiratory: Negative for cough and  shortness of breath.   Cardiovascular: Negative for chest pain.  Gastrointestinal: Negative for nausea, vomiting and abdominal pain.  Genitourinary: Negative for dysuria and hematuria.  Musculoskeletal: Negative for back pain.  Neurological: Negative for headaches.    Vital Signs: BP 130/81 mmHg  Pulse 88  Temp(Src) 98 F (36.7 C) (Oral)  Resp 16  Ht 5' 8"  (1.727 m)  Wt 195 lb 8 oz (88.678 kg)  BMI 29.73 kg/m2  SpO2 100%  Physical Exam  Constitutional: He is oriented to person, place, and time. He appears well-developed and well-nourished.  Cardiovascular: Normal rate and regular rhythm.   Pulmonary/Chest: Effort normal and breath sounds normal.  Abdominal: Soft. Bowel sounds are normal. There is no tenderness.  Musculoskeletal: Normal range of motion. He exhibits no edema.  Neurological: He is alert and oriented to person, place, and time.    Imaging: No results found.  Labs:  CBC:  Recent Labs  01/24/14 0927 02/22/14 0920 03/23/14 0934 04/06/14 0715  WBC 5.0 4.6 5.4 5.8  HGB 16.7 15.9 15.0 13.9  HCT 49.8 47.3 46.5 42.4  PLT 170 165 175 191    COAGS:  Recent Labs  04/06/14 0715  INR 1.43  APTT 30    BMP: No results for input(s): NA, K, CL, CO2, GLUCOSE, BUN, CALCIUM, CREATININE, GFRNONAA, GFRAA in the last 8760 hours.  Invalid input(s): CMP  LIVER FUNCTION TESTS: No results for input(s): BILITOT, AST, ALT, ALKPHOS, PROT, ALBUMIN in the last 8760 hours.  TUMOR MARKERS: No results for input(s): AFPTM, CEA, CA199, CHROMGRNA in the last 8760 hours.  Assessment and Plan: Jesse Bowers is a 49 y.o. 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 Details/risks of procedure d/w pt with his understanding and consent.    SignedAutumn Messing 04/06/2014, 8:44 AM

## 2014-04-06 NOTE — Procedures (Signed)
Procedure:  CT guided right iliac bone marrow biopsy Findings:  Aspirate and core biopsy obtained of right iliac bone marrow. No complications.

## 2014-04-06 NOTE — Discharge Instructions (Signed)
Conscious Sedation °Sedation is the use of medicines to promote relaxation and relieve discomfort and anxiety. Conscious sedation is a type of sedation. Under conscious sedation you are less alert than normal but are still able to respond to instructions or stimulation. Conscious sedation is used during short medical and dental procedures. It is milder than deep sedation or general anesthesia and allows you to return to your regular activities sooner.  °LET YOUR HEALTH CARE PROVIDER KNOW ABOUT:  °· Any allergies you have. °· All medicines you are taking, including vitamins, herbs, eye drops, creams, and over-the-counter medicines. °· Use of steroids (by mouth or creams). °· Previous problems you or members of your family have had with the use of anesthetics. °· Any blood disorders you have. °· Previous surgeries you have had. °· Medical conditions you have. °· Possibility of pregnancy, if this applies. °· Use of cigarettes, alcohol, or illegal drugs. °RISKS AND COMPLICATIONS °Generally, this is a safe procedure. However, as with any procedure, problems can occur. Possible problems include: °· Oversedation. °· Trouble breathing on your own. You may need to have a breathing tube until you are awake and breathing on your own. °· Allergic reaction to any of the medicines used for the procedure. °BEFORE THE PROCEDURE °· You may have blood tests done. These tests can help show how well your kidneys and liver are working. They can also show how well your blood clots. °· A physical exam will be done.   °· Only take medicines as directed by your health care provider. You may need to stop taking medicines (such as blood thinners, aspirin, or nonsteroidal anti-inflammatory drugs) before the procedure.   °· Do not eat or drink at least 6 hours before the procedure or as directed by your health care provider. °· Arrange for a responsible adult, family member, or friend to take you home after the procedure. He or she should stay  with you for at least 24 hours after the procedure, until the medicine has worn off. °PROCEDURE  °· An intravenous (IV) catheter will be inserted into one of your veins. Medicine will be able to flow directly into your body through this catheter. You may be given medicine through this tube to help prevent pain and help you relax. °· The medical or dental procedure will be done. °AFTER THE PROCEDURE °· You will stay in a recovery area until the medicine has worn off. Your blood pressure and pulse will be checked.   °·  Depending on the procedure you had, you may be allowed to go home when you can tolerate liquids and your pain is under control. °Document Released: 11/12/2000 Document Revised: 02/22/2013 Document Reviewed: 10/25/2012 °[employer]® Patient Information ©2015 [employer]. This information is not intended to replace advice given to you by your health care provider. Make sure you discuss any questions you have with your health care provider. °Bone Marrow Aspiration and Bone Biopsy °Examination of the bone marrow is a valuable test to diagnose blood disorders. A bone marrow biopsy takes a sample of bone and a small amount of fluid and cells from inside the bone. A bone marrow aspiration removes only the marrow. Bone marrow aspiration and bone biopsies are used to stage different disorders of the blood, such as leukemia. Staging will help your caregiver understand how far the disease has progressed.  °The tests are also useful in diagnosing: °· Fever of unknown origin (FUO). °· Bacterial infections and other widespread fungal infections. °· Cancers that have spread (metastasized) to the bone   marrow.  Diseases that are characterized by a deficiency of an enzyme (storage diseases). This includes:  Niemann-Pick disease.  Gaucher disease. PROCEDURE  Sites used to get samples include:   Back of your hip bone (posterior iliac crest).  Both aspiration and biopsy.  Front of your hip bone (anterior iliac  crest).  Both aspiration and biopsy.  Breastbone (sternum).  Aspiration from your breastbone (done only in adults). This method is rarely used. When you get a hip bone aspiration:  You are placed lying on your side with the upper knee brought up and flexed with the lower leg straight.  The site is prepared, cleaned with an antiseptic scrub, and draped. This keeps the biopsy area clean.  The skin and the area down to the lining of the bone (periosteum) are made numb with a local anesthetic.  The bone marrow aspiration needle is inserted. You will feel pressure on your bone.  Once inside the marrow cavity, a sample of bone marrow is sucked out (aspirated) for pathology slides.  The material collected for bone marrow slides is processed immediately by a technologist.  The technician selects the marrow particles to make the slides for pathology.  The marrow aspiration needle is removed. Then pressure is applied to the site with gauze until bleeding has stopped. Following an aspiration, a bone marrow biopsy may be performed as well. The technique for this is very similar. A dressing is then applied.  RISKS AND COMPLICATIONS  The main complications of a bone marrow aspiration and biopsy include infection and bleeding.  Complications are uncommon. The procedure may not be performed in patients with bleeding tendencies.  A very rare complication from the procedure is injury to the heart during a breastbone (sternal) marrow aspiration. Only bone marrow aspirations are performed in this area.  Long-lasting pain at the site of the bone marrow aspiration and biopsy is uncommon. Your caregiver will let you know when you are to get your results and will discuss them with you. You may make an appointment with your caregiver to find out the results. Do not assume everything is normal if you have not heard from your caregiver or the medical facility. It is important for you to follow up on all of  your test results. Document Released: 02/21/2004 Document Revised: 05/12/2011 Document Reviewed: 02/15/2008 [facility] Patient Information 2015 [place], [place]. This information is not intended to replace advice given to you by your health care provider. Make sure you discuss any questions you have with your health care provider.

## 2014-04-10 ENCOUNTER — Ambulatory Visit: Payer: Self-pay | Admitting: Neurology

## 2014-04-10 ENCOUNTER — Ambulatory Visit (INDEPENDENT_AMBULATORY_CARE_PROVIDER_SITE_OTHER): Payer: BLUE CROSS/BLUE SHIELD | Admitting: Neurology

## 2014-04-10 ENCOUNTER — Encounter: Payer: Self-pay | Admitting: Neurology

## 2014-04-10 VITALS — BP 110/82 | HR 94 | Resp 14 | Ht 69.0 in | Wt 201.6 lb

## 2014-04-10 DIAGNOSIS — R5382 Chronic fatigue, unspecified: Secondary | ICD-10-CM

## 2014-04-10 DIAGNOSIS — R200 Anesthesia of skin: Secondary | ICD-10-CM

## 2014-04-10 DIAGNOSIS — R442 Other hallucinations: Secondary | ICD-10-CM

## 2014-04-10 DIAGNOSIS — R930 Abnormal findings on diagnostic imaging of skull and head, not elsewhere classified: Secondary | ICD-10-CM

## 2014-04-10 DIAGNOSIS — G939 Disorder of brain, unspecified: Secondary | ICD-10-CM | POA: Insufficient documentation

## 2014-04-10 DIAGNOSIS — G9332 Myalgic encephalomyelitis/chronic fatigue syndrome: Secondary | ICD-10-CM | POA: Insufficient documentation

## 2014-04-10 DIAGNOSIS — R9089 Other abnormal findings on diagnostic imaging of central nervous system: Secondary | ICD-10-CM

## 2014-04-10 DIAGNOSIS — G4733 Obstructive sleep apnea (adult) (pediatric): Secondary | ICD-10-CM

## 2014-04-10 DIAGNOSIS — G950 Syringomyelia and syringobulbia: Secondary | ICD-10-CM | POA: Insufficient documentation

## 2014-04-10 DIAGNOSIS — G4700 Insomnia, unspecified: Secondary | ICD-10-CM

## 2014-04-10 DIAGNOSIS — R413 Other amnesia: Secondary | ICD-10-CM

## 2014-04-10 DIAGNOSIS — G894 Chronic pain syndrome: Secondary | ICD-10-CM

## 2014-04-10 MED ORDER — CYCLOBENZAPRINE HCL 5 MG PO TABS
5.0000 mg | ORAL_TABLET | Freq: Three times a day (TID) | ORAL | Status: DC | PRN
Start: 2014-04-10 — End: 2014-07-18

## 2014-04-10 NOTE — Progress Notes (Signed)
[facility]  PATIENT: Jesse Bowers DOB: 12-Jan-1966  REFERRING CLINICIAN: Concepcion Elk HISTORY FROM: Patient REASON FOR VISIT: memory loss   HISTORICAL  CHIEF COMPLAINT:  Chief Complaint  Patient presents with  . olfactory hallucinations    Sts. continues to have olfactory hallucinations.  Sts. he continues to smell cigarette smoke, spoiled food odor.  Sts. bending over past his waist will cause smells.  Also sts. he usually has a h/a afterwards.   . Memory Loss    Sts. his psychiatrist is requesting neurocognitive testing for memory/concentration    HISTORY OF PRESENT ILLNESS:  Jesse Bowers is a 49 year old transgender male last seen 12/29/2013 for his initial consultation to rule out multiple sclerosis. Time, I felt that the foci seen on MRI are unlikely to represent demyelination by recommended that he be followed up for a couple more visits and have another MRI in 2016. Complicating doses, he has trouble medical issues as detailed below.  Cognitive concerns seem worse than last year.    He notes a lot of difficulty focusing.  He notes difficulty with short term memory.    He does not remember if he has done things like shower that day or turn something off.   He needs reminders to take his medications and pick up his kid.       He has had difficulty with stuttering and word finding as well.     He notes chronic fatigue.   He has OSA and has trouble wearing the mask the entire night.  He is on BiPAP.  He was started on it when in [place] 6 months ago.   He moved here a couple weeks after he got the BiPAP and does not think a doctor has ever seen the download.    He uses [employer].     He feels he averages 3 hours most nights and feel she is better the next day if he wears it longer. He continues to have insomnia and also has PTSD.    He tried trazodone for sleep but it did not help much.    He was recently started on prazosin for insomnia with minimal benefit.     He sees Dr. Lita Mains (Psychiatry) and Dr. Rexene Edison (Psychology).     He has noted olfactory hallucinations of cigarette smoke the past 2 - 3 months.   This is now happening almost daily and is sometimes triggered by laying down or bending over.   His wife tells him he acts different afterwards and he usually has a headache afterwards.     A CT sinus was normal.    He doe snot note any changes in taste.    No recent URI.    He has history of seizures as a child. He was on phenobarbital for a couple years.  He has tingling in his hands, left more than right.   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   Symptoms have been stable x the past year or more.       He has chronic pain helped a lot by a numbing ointment and Morphine.    He also reports his right foot turns in as he walks and PMR is setting him up for an AFO.  He is on Enbrel for psoriatic arthritis.   He tolerates it well   He has Protein C deficiency and is on Xarelto.    His MRI brain last year showed some mild predominantly peripheral hyperintense foci that  are non-specific.  A relationship to the Prt C deficiency is possible.     REVIEW OF SYSTEMS:  Constitutional: Has fatigue.  See above.   No fevers, chills, sweats, or change in appetite Eyes: Occ episodes of blurred vision.   No eye pain Ear, nose and throat: No hearing loss, ear pain, nasal congestion, sore throat Cardiovascular: No chest pain.   He notes occ. palpitations Respiratory:  No shortness of breath at rest or with exertion.   No wheezes GastrointestinaI: No nausea, vomiting, diarrhea, abdominal pain, fecal incontinence Genitourinary:  No dysuria, urinary retention or frequency.  No nocturia. Musculoskeletal:  He has psoriatic arthritis with joint pain/swelling. Integumentary:has psoriasis Neurological: as above Psychiatric: No depression at this time.  No anxiety Endocrine: He is transgender and on chronic testosterone therapy.   Reports heat intolerance.  No palpitations,  diaphoresis, change in appetite, change in weigh or increased thirst Hematologic/Lymphatic:  No anemia, purpura, petechiae. Allergic/Immunologic: No itchy/runny eyes, nasal congestion, recent allergic reactions, rashes  ALLERGIES: Allergies  Allergen Reactions  . Penicillin G Anaphylaxis  . Vancomycin Rash  . Duloxetine     Restless legs  . Gabapentin Nausea Only  . Sulfa Antibiotics Rash    Stevens-Johnson rash    HOME MEDICATIONS: Outpatient Prescriptions Prior to Visit  Medication Sig Dispense Refill  . buPROPion (WELLBUTRIN SR) 150 MG 12 hr tablet Take 150 mg by mouth daily.    . busPIRone (BUSPAR) 15 MG tablet Take 30 mg by mouth 2 (two) times daily.     . diclofenac sodium (VOLTAREN) 1 % GEL Apply 1 application topically 3 (three) times daily. To feet, hands, knees, elbows 3 Tube 4  . etanercept (ENBREL) 50 MG/ML injection Inject into the skin once a week.    . morphine (MS CONTIN) 30 MG 12 hr tablet Take 1 tablet (30 mg total) by mouth every 8 (eight) hours. 90 tablet 0  . prazosin (MINIPRESS) 1 MG capsule Take 2 mg by mouth at bedtime.     . rivaroxaban (XARELTO) 20 MG TABS tablet Take 1 tablet (20 mg total) by mouth daily with supper. 30 tablet 3  . sennosides-docusate sodium (SENOKOT-S) 8.6-50 MG tablet Take 1 tablet by mouth daily.    [name] testosterone cypionate (DEPOTESTOTERONE CYPIONATE) 200 MG/ML injection Inject into the muscle once a week.     No facility-administered medications prior to visit.    PAST MEDICAL HISTORY: Past Medical History  Diagnosis Date  . psoriatic arthritis   . H/O protein C deficiency   . Hx-TIA (transient ischemic attack)   . Clotting disorder   . Celiac disease   . H/O parotitis     right   . Polycythemia, secondary   . Transgendered   . Sleep apnea   . Neck pain   . Abnormal weight loss   . Gluten enteropathy   . Syrinx of spinal cord 01/06/2014 on MRI    c spine  . Anxiety     PTSD per patient    PAST SURGICAL HISTORY: Past  Surgical History  Procedure Laterality Date  . Thyroidectomy, partial  2008  . Cholecystectomy    . Ankle arthroscopy with reconstruction Right 2007  . Knee surgery Bilateral 1984    Right ACL, left PCL repair  . Liver biopsy  2013    normal results.  . Hip arthroscopy w/ labral repair Right 05/11/2013    FAMILY HISTORY: Family History  Problem Relation Age of Onset  . Stroke Paternal Uncle  age 57  . Stroke Maternal Grandfather     83  . Hypertension Mother   . Psoriasis Mother   . Stroke Maternal Grandmother   . Congestive Heart Failure Maternal Grandmother   . Cancer Paternal Grandfather   . Protein C deficiency Sister 24    Miscarriages    SOCIAL HISTORY:  History   Social History  . Marital Status: Married    Spouse Name: N/A    Number of Children: 2  . 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 Smoking status: Never Smoker   . Smokeless tobacco: Not on file  . Alcohol Use: Yes     Comment: wine once a month  . Drug Use: No  . Sexual Activity: Yes    Birth Control/ Protection: None     Comment: patient is a transgender on testosterone shots, no biological kids   Other Topics Concern  . 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 Relocated to [place] about 2 months ago from [place], [place]. Patient was in [place] for last 4 years and prior to that in [place]. His wife is working as Scientist, research (physical sciences) for [employer]. Patient is not working and applying for disability. They have 2 kids but no biologic children.      PHYSICAL EXAM  There were no vitals filed for this visit.  There is no weight on file to calculate BMI.   General: The patient is well-developed and well-nourished and in no acute distress  Eyes:  Funduscopic  exam is difficult due to small pupils from opiate therapy.    Neck: The neck is supple, no carotid bruits are noted.  The neck is nontender.  Respiratory: The respiratory examination is clear.  Cardiovascular: The cardiovascular examination reveals a regular rate and rhythm, no murmurs, gallops or rubs are noted.  Skin: Extremities are without significant edema.  Neurologic Exam  Mental status: The patient is alert and oriented x 3 at the time of the examination. He answers questions appropriate;ly.   The patient has apparent normal recent and remote memory, with an apparently normal attention span and concentration ability.   Speech is normal.  Cranial nerves: Extraocular movements are full. Pupils are equal, round (39m), and reactive to light and accomodation.  Visual fields are full.  Facial symmetry is present. There is good facial sensation to soft touch bilaterally.Facial strength is normal.  Trapezius and sternocleidomastoid strength is normal. No dysarthria is noted.  The tongue is midline, and the patient has symmetric elevation of the soft palate. No obvious hearing deficits are noted.  Motor:  Muscle bulk and tone are normal. Strength is  5 / 5 in all 4 extremities.   Sensory: Sensory testing is reduced to touch and vibration in the hands, worse at digit 5, compared to thumb   patch of skin with reduced sensation in upper back centrally.   .  Coordination: Cerebellar testing reveals good finger-nose-finger and heel-to-shin bilaterally.  Gait and station: Station is normal and feet are inverted as he walks, mild right steppage.     Tandem gait is a little wide. Romberg is negative.   Reflexes: Deep tendon reflexes are symmetric and normal bilaterally. Plantar responses are normal.  DIAGNOSTIC DATA (LABS, IMAGING, TESTING) - I reviewed patient records, labs, notes, testing and imaging myself where available.  Lab Results  Component Value Date   WBC 5.8 04/06/2014   HGB  13.9 04/06/2014   HCT 42.4 04/06/2014   MCV 92.2 04/06/2014   PLT 191 04/06/2014    Lab Results  Component Value Date   VITAMINB12 320 12/26/2013   Lab Results  Component Value Date   TSH 0.778 01/24/2014        ASSESSMENT AND PLAN  Syringomyelia  Abnormal finding on MRI of brain  Chronic pain syndrome  Chronic fatigue  Numbness  Memory loss - Plan: EEG adult, Ambulatory referral to Neuropsychology  Olfactory hallucination - Plan: EEG adult  OSA (obstructive sleep apnea)  Insomnia    In summary, Jesse Bowers is a 49 year old transgender man with multiple medical issues including sleep apnea, syringomyelia and chronic pain syndrome. He has symptoms of numbness in the hands that are most likely related to the syrinx in the cervical spine. As long as this does not progress I would not command surgery. However, if he starts to progress we will need to refer him to neurosurgery for another opinion. I would like to recheck an MRI of the cervical spine later this year to ensure stability. Even if he does not have symptoms, if the syrinx seen significantly larger I will send him to neurosurgery. I believe his memory loss issues most likely due to poor sleep from sleep apnea and insomnia. We will check neurocognitive testing to see if there is a pattern that would would suggest an alternative diagnosis. I'll also start him on low-dose cyclobenzaprine at night. One study found that this can help PTSD and sleep quality. He is advised to continue the BiPAP and uses much as possible. I don't know what to make of the olfactory symptoms. Most likely they are idiopathic, possibly due to spinal changes. However, she has a history of seizures when he was younger I will check an EEG to see if there is any relationship.  He will return to see me in 4 months or sooner if he has new or worsening neurologic symptoms.  50 minutes face-to-face with greater than 50% of the time counseling  correlating care about his multiple neurologic symptoms.   Jesse Bowers A. [name], MD, PhD 5/0/3888, 2:80 PM Certified in Neurology, Clinical Neurophysiology, Sleep Medicine, Pain Medicine and Neuroimaging  [facility] [address], [place], [place] [address] [phone]

## 2014-04-11 ENCOUNTER — Other Ambulatory Visit: Payer: Self-pay | Admitting: *Deleted

## 2014-04-11 ENCOUNTER — Telehealth: Payer: Self-pay | Admitting: Neurology

## 2014-04-11 ENCOUNTER — Telehealth: Payer: Self-pay | Admitting: *Deleted

## 2014-04-11 ENCOUNTER — Ambulatory Visit (INDEPENDENT_AMBULATORY_CARE_PROVIDER_SITE_OTHER): Payer: BLUE CROSS/BLUE SHIELD | Admitting: Psychology

## 2014-04-11 DIAGNOSIS — F4323 Adjustment disorder with mixed anxiety and depressed mood: Secondary | ICD-10-CM

## 2014-04-11 DIAGNOSIS — D751 Secondary polycythemia: Secondary | ICD-10-CM

## 2014-04-11 DIAGNOSIS — Z862 Personal history of diseases of the blood and blood-forming organs and certain disorders involving the immune mechanism: Secondary | ICD-10-CM

## 2014-04-11 NOTE — Telephone Encounter (Signed)
This is a regular tablet.  I called the patient back.  He is aware, says that is fine, he has no problems swallowing tablets.

## 2014-04-11 NOTE — Telephone Encounter (Signed)
Patient is calling in regard to Rx Flexiril.5 mg.  He was told that medication should dissolve under the tongue however the pill has a coating and it will not dissolve. Please call.

## 2014-04-11 NOTE — Telephone Encounter (Signed)
Patient medical records are at the front desk ready for pickup.

## 2014-04-12 ENCOUNTER — Ambulatory Visit (HOSPITAL_BASED_OUTPATIENT_CLINIC_OR_DEPARTMENT_OTHER): Payer: BLUE CROSS/BLUE SHIELD | Admitting: Hematology

## 2014-04-12 ENCOUNTER — Encounter: Payer: Self-pay | Admitting: Hematology

## 2014-04-12 ENCOUNTER — Other Ambulatory Visit (HOSPITAL_BASED_OUTPATIENT_CLINIC_OR_DEPARTMENT_OTHER): Payer: BLUE CROSS/BLUE SHIELD

## 2014-04-12 ENCOUNTER — Telehealth: Payer: Self-pay | Admitting: Hematology

## 2014-04-12 VITALS — BP 137/76 | HR 115 | Temp 98.7°F | Resp 21 | Ht 69.0 in | Wt 198.3 lb

## 2014-04-12 DIAGNOSIS — D751 Secondary polycythemia: Secondary | ICD-10-CM

## 2014-04-12 DIAGNOSIS — Z86718 Personal history of other venous thrombosis and embolism: Secondary | ICD-10-CM

## 2014-04-12 DIAGNOSIS — Z862 Personal history of diseases of the blood and blood-forming organs and certain disorders involving the immune mechanism: Secondary | ICD-10-CM

## 2014-04-12 DIAGNOSIS — E291 Testicular hypofunction: Secondary | ICD-10-CM

## 2014-04-12 DIAGNOSIS — D6859 Other primary thrombophilia: Secondary | ICD-10-CM

## 2014-04-12 DIAGNOSIS — Z8673 Personal history of transient ischemic attack (TIA), and cerebral infarction without residual deficits: Secondary | ICD-10-CM

## 2014-04-12 DIAGNOSIS — I82409 Acute embolism and thrombosis of unspecified deep veins of unspecified lower extremity: Secondary | ICD-10-CM

## 2014-04-12 LAB — CBC WITH DIFFERENTIAL/PLATELET
BASO%: 0.9 % (ref 0.0–2.0)
BASOS ABS: 0 10*3/uL (ref 0.0–0.1)
EOS%: 0 % (ref 0.0–7.0)
Eosinophils Absolute: 0 10*3/uL (ref 0.0–0.5)
HCT: 45.3 % (ref 38.4–49.9)
HGB: 14.7 g/dL (ref 13.0–17.1)
LYMPH%: 25.5 % (ref 14.0–49.0)
MCH: 29.7 pg (ref 27.2–33.4)
MCHC: 32.5 g/dL (ref 32.0–36.0)
MCV: 91.5 fL (ref 79.3–98.0)
MONO#: 0.4 10*3/uL (ref 0.1–0.9)
MONO%: 8.2 % (ref 0.0–14.0)
NEUT#: 2.9 10*3/uL (ref 1.5–6.5)
NEUT%: 65.4 % (ref 39.0–75.0)
Platelets: 187 10*3/uL (ref 140–400)
RBC: 4.95 10*6/uL (ref 4.20–5.82)
RDW: 12.1 % (ref 11.0–14.6)
WBC: 4.5 10*3/uL (ref 4.0–10.3)
lymph#: 1.1 10*3/uL (ref 0.9–3.3)

## 2014-04-12 NOTE — Telephone Encounter (Signed)
gv adn printed appt sched and avs for pt for Feb thru May

## 2014-04-12 NOTE — Telephone Encounter (Signed)
I have made a referral to PT for therapy

## 2014-04-12 NOTE — Progress Notes (Signed)
[facility] HEMATOLOGY FOLLOW UP NOTE DATE OF VISIT: 01/09/2014  Patient Care Team: Jesse Elk, MD as PCP - General (Internal Medicine) Jesse Marla Roe, MD as Consulting Physician (Hematology) Jesse Nip, MD as Referring Physician (Family Medicine)  CHIEF COMPLAINTS Follow up DVT and polycythemia  HISTORY OF INITIAL PRESENTING ILLNESS (from Jesse Bowers note):   Jesse Bowers 49 y.o. 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 Patient is a former Software engineer. Patient had a fractured foot repair in 2007 and caused a right leg DVT. At the time he took Coumadin for 3 months.   In May of this year 2015 patient presented with right-sided weakness and slurring of speech, he felt tired, had some nausea and was having trouble gripping the soap when he was in the shower. He checked himself in the mirror there was no facial droop but based on the symptoms he texted his wife took him to the hospital where he was kept overnight. His symptoms of TIA lasted for more than 24 hours and he had appropriate neuro imaging done. When he was seen by a neurologist later, he was told that he had a left frontal stroke. This happened at a young age of 89 so his primary care physician ordered hypercoagulable panel and that showed a protein C deficiency. This TIA or stroke event took place on 07/27/2013 an MRI brain from that date showed changes of early chronic small vessel ischemic changes. A note from the previous hematologist Jesse Bowers in [facility] indicated that at [facility] they also felt that it was not a stroke. Carotid ultrasound showed minimal atherosclerotic plaques. The hypercoagulable panel was drawn on 08/19/2013 and he was negative for factor V Leiden mutation, prothrombin gene mutation. He had normal levels of antithrombin III and protein S but the protein C level was decreased at 47%(reference range is  70-180%). A repeat protein C activity done on 09/28/2013 was 58% still low. Lupus anticoagulant was negative. Beta-2 glycoprotein antibodies were negative.  Patient is currently receiving a baby aspirin for last 3 months. The hematologist in [place] Jesse. Alain Bowers also gave him some Lovenox injections for prophylactic use just before a long distance travel. He encouraged him to avoid inactivity/immobility. Patient was not given an anticoagulant such as warfarin or the new generation drugs like Xarelto or Eliquis.  On 04/28/2013 patient's CBC showed a white count of 6.2 hemoglobin 18.7 hematocrit 53.1 MCV 92 platelet count of 207. Differential was normal. Chemistry panel was normal creatinine 1.23. Patient does have history of elevated liver enzymes in the past and the etiology of that is not clear likely medication related. He had liver biopsy done in 2013 which was reportedly normal. Patient was taking Remicade for over an year and it was helping his skin lesions of psoriasis but not the joint pain. It was switched to Enbrel which he is still taking a subcutaneous shot once a week that is every Sunday.  Patient also have some pain control issues and was seeing a pain specialist in [place]. He is in the process of getting a referral to a pain specialist here. His pain medications include morphine extended release, oxycodone, occasional naproxen and methadone. Patient is also on antidepressants.  Patient also had a clot at PICC line for antibiotics in 2006. Patient had history of psoriasis and psoriatic arthritis being managed by a rheumatologist in [place]. Now he is shifting his care to a local rheumatologist here.  Most of his psoriasis involves the bottom of the feet mostly on the left side, hands, knees and ears.  Patient also had a history of right-sided parotitis documented in May 2015 that was treated with antibiotics. He does have chronic dry mouth because of sleep apnea as well as his antidepressant  therapy.  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 That itself can increase the risk for both arterial as well as venous thromboembolic events. 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 For his depression patient was on Prozac in March 2015 but it was causing decreased sexual libido and changed to Wellbutrin in March 2015. He does have mild constipation with the use of narcotics and takes Colace twice a day.  Patient is also complaining of 50 pound weight loss in the last 3-4 months. He thinks it is unintentional. He has been on immunosuppressant medication such as Remicade and Enbrel and sometimes those can be associated with possibility of non-Hodgkin lymphomas. He also have chronic pain and recently saw an orthopedic physician who recommended MRI of the neck which will be scheduled. He is also seeing a rheumatologist next month, a neurologist and a pain specialist. He does complain of left shoulder pain and left elbow pain. There was some discussion by previous rheumatologist in [place] about switching his biological therapy boost to Stelara or [place] Nail once he is evaluated by a rheumatologist. Patient had already received his flu shot as well as pneumonia shot. He also complains of having significant short-term memory problems and trouble concentrating which could be from his medications or TIA episode.  So patient's active hematologic problems are  #1 Protein C deficiency documented twice. #2 TIA/CVA in May 2015 #3 history of DVT in 2007 Right leg treated with Coumadin x 3 months. It was provoked by foot injury #3 Use of immunosuppressant medications which increase the likelihood of lymphomas. 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   INTERVAL HISTORY: Jesse Bowers returns for  follow-up and to discuss bone marrow biopsy results. He tolerated the biopsy procedure well, no complications. He otherwise feels this about the same, no new complaints.   MEDICAL HISTORY:   Past Medical History  Diagnosis Date  . psoriatic arthritis   . H/O protein C deficiency   . Hx-TIA (transient ischemic attack)   . Clotting disorder   . Celiac disease   . H/O parotitis     right   . Polycythemia, secondary   . Transgendered   . Sleep apnea   . Neck pain   . Abnormal weight loss   . Gluten enteropathy   . Syrinx of spinal cord 01/06/2014 on MRI    c spine  . Anxiety     PTSD per patient    SURGICAL HISTORY:  Past Surgical History  Procedure Laterality Date  . Thyroidectomy, partial  2008  . Cholecystectomy    . Ankle arthroscopy with reconstruction Right 2007  . Knee surgery Bilateral 1984    Right ACL, left PCL repair  . Liver biopsy  2013    normal results.  . Hip arthroscopy w/ labral repair Right 05/11/2013    SOCIAL HISTORY:  History   Social History  . Marital Status: Married    Spouse Name: N/A  . Number of Children: 2  . Years of Education: 4y college  Occupational History  . 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 Smoking status: Never Smoker   . Smokeless tobacco: Not on file  . Alcohol Use: Yes     Comment: wine once a month  . Drug Use: No  . Sexual Activity: Yes    Birth Control/ Protection: None     Comment: patient is a transgender on testosterone shots, no biological kids   Other Topics Concern  . Not on file   Social History Narrative   Education 4 year college, former Therapist, sports X 15 years, pediatric nurse practitioner x 6 years, did NP degree from [employer] of [place]. Relocated to [place] about 2 months ago from [place], [place]. Patient was in [place] for last 4 years and prior to that in [place]. His wife is working as Scientist, research (physical sciences) for [employer]. Patient is not working and  applying for disability. They have 2 kids but no biologic children.     FAMILY HISTORY:  Family History  Problem Relation Age of Onset  . Stroke Paternal Uncle     age 59  . Stroke Maternal Grandfather     7  . Hypertension Mother   . Psoriasis Mother   . Stroke Maternal Grandmother   . Congestive Heart Failure Maternal Grandmother   . Cancer Paternal Grandfather   . Protein C deficiency Sister 4    Miscarriages    ALLERGIES:  is allergic to penicillin g; vancomycin; duloxetine; gabapentin; and sulfa antibiotics.  MEDICATIONS:  Current Outpatient Prescriptions  Medication Sig Dispense Refill  . buPROPion (WELLBUTRIN SR) 150 MG 12 hr tablet Take 150 mg by mouth daily.    . busPIRone (BUSPAR) 15 MG tablet Take 30 mg by mouth 2 (two) times daily.     . cyclobenzaprine (FLEXERIL) 5 MG tablet Take 1 tablet (5 mg total) by mouth every 8 (eight) hours as needed for muscle spasms. 30 tablet 11  . diclofenac sodium (VOLTAREN) 1 % GEL Apply 1 application topically 3 (three) times daily. To feet, hands, knees, elbows 3 Tube 4  . etanercept (ENBREL) 50 MG/ML injection Inject into the skin once a week.    . morphine (MS CONTIN) 30 MG 12 hr tablet Take 1 tablet (30 mg total) by mouth every 8 (eight) hours. 90 tablet 0  . prazosin (MINIPRESS) 1 MG capsule Take 2 mg by mouth at bedtime.     . rivaroxaban (XARELTO) 20 MG TABS tablet Take 1 tablet (20 mg total) by mouth daily with supper. 30 tablet 3  . sennosides-docusate sodium (SENOKOT-S) 8.6-50 MG tablet Take 1 tablet by mouth daily.    [name] testosterone cypionate (DEPOTESTOTERONE CYPIONATE) 200 MG/ML injection Inject into the muscle once a week.     No current facility-administered medications for this visit.    REVIEW OF SYSTEMS:   Constitutional: Denies fevers, chills or abnormal night sweats Eyes: Denies blurriness of vision, double vision or watery eyes Ears, nose, mouth, throat, and face: Denies mucositis or sore throat, +Dry  mouth Respiratory: Denies cough, dyspnea or wheezes Cardiovascular: Denies palpitation, chest discomfort or lower extremity swelling Gastrointestinal:  Denies nausea, heartburn, + change in bowel habits that is Constipation Skin: Hx of psoriasis and arthropathy from it.  Lymphatics: Denies new lymphadenopathy or easy bruising, hx of right parotitis twice. Neurological:Denies numbness, tingling or new weaknesses, recent TIA March 2015 Behavioral/Psych: Mood is stable, no new changes, +depression and anxiety, +memory problems All other  systems were reviewed with the patient and are negative.  PHYSICAL EXAMINATION: ECOG PERFORMANCE STATUS: 1  Filed Vitals:   04/12/14 1038  BP: 137/76  Pulse: 115  Temp: 98.7 F (37.1 C)  Resp: 21   Filed Weights   04/12/14 1038  Weight: 198 lb 4.8 oz (89.948 kg)    GENERAL:alert, no distress and comfortable SKIN: skin color, texture, turgor are normal, no rashes or significant lesions EYES: normal, conjunctiva are pink and non-injected, sclera clear OROPHARYNX:no exudate, no erythema and lips, buccal mucosa, and tongue normal  NECK: supple, thyroid normal size, non-tender, without nodularity LYMPH:  no palpable lymphadenopathy in the cervical, axillary or inguinal LUNGS: clear to auscultation and percussion with normal breathing effort HEART: regular rate & rhythm and no murmurs and no lower extremity edema ABDOMEN:abdomen soft, non-tender and normal bowel sounds Musculoskeletal:no cyanosis of digits and no clubbing  PSYCH: alert & oriented x 3 NEURO: no focal motor/sensory deficits  LABORATORY DATA:  I have reviewed the data as listed CBC Latest Ref Rng 04/12/2014 04/06/2014 03/23/2014  WBC 4.0 - 10.3 10e3/uL 4.5 5.8 5.4  Hemoglobin 13.0 - 17.1 g/dL 14.7 13.9 15.0  Hematocrit 38.4 - 49.9 % 45.3 42.4 46.5  Platelets 140 - 400 10e3/uL 187 191 175   Erythropoietin  Status: Finalresult Visible to patient:  MyChart Nextappt: Today at  10:15 AM in Oncology Burr Medico, Krista Blue, [place]) Dx:  Polycythemia, secondary         Ref Range 2wk ago    Erythropoietin 2.6 - 18.5 mIU/mL 17.0         Pathology report Diagnosis Bone Marrow, Aspirate,Biopsy, and Clot, right iliac - SLIGHTLY HYPERCELLULAR BONE MARROW FOR AGE WITH TRILINEAGE HEMATOPOIESIS. - SEE COMMENT. PERIPHERAL BLOOD: - NO SIGNIFICANT MORPHOLOGIC ABNORMALITIES. Diagnosis Note The bone marrow is slightly hypercellular with trilineage hematopoiesis and essentially orderly and progressive maturation of all cell types. Significant dyspoiesis is not present. Iron stores are markedly decreased to absent and hence correlation with serum iron studies is recommended. There is no morphologic evidence of a myeloproliferative process in this material. Correlation with cytogenetic studies is recommended. (BNS:ecj 04-21-2014)   RADIOGRAPHIC STUDIES: No results found.  ASSESSMENT & PLAN:   1. 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 He has slightly hypercellular bone marrow with trilineage hematopoiesis.  No morphology evidence of myeloproliferative process. Cytogenetics was normal (no Y chromosome).  JAK2 mutation was negative. -He had Hemoglobin 18.7 hematocrit 53.1 in 04/2013. His able level is normal. His presentation is consistent with secondary polycythemia, likely secondary to testosterone injections. 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 He does have hot flash and other symptoms without testosterone injection. I recommend him to maintain his testosterone level at low normal limits, and he agrees.  -We discussed that secondary polycythemia is usually benign, risk of thrombosis is relatively low than PV. However he did have history of stroke, and he feels fatigued when his hemoglobin is high. He  agrees to continue phlebotomy as needed. -I'll set him up for monthly CBC, and phlebotomy if hematocrit above 46%.   2. History of DVT and stroke, Protein C deficiency  -Jesse. Lona Kettle started him on Xarelto, his neurologist also feels he needs anticoagulation due to his history of stroke.  -I think is reasonable to continue Xarelto given his underlying protein C deficiency and history of DVT and  stroke.   3. History of CVA -His primary care physician is setting up a referral to a neurologist at [facility]. He will follow  4. 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  -We discussed that excessive testosterone would increase the risk of thrombosis and worsen his polycythemia. I prefer him to maintain low normal level of testosterone. He agrees.   PLAN: -Continue Xarelto -Monthly CBC, phlebotomy 1 unit if hematocrit above 46% - RTC in 3 month   All questions were answered. The patient knows to call the clinic with any problems, questions or concerns.  I spent 20 minutes counseling the patient face to face. The total time spent in the appointment was 25 minutes and more than 50% was on counseling.   [name]  04/12/2014

## 2014-04-13 ENCOUNTER — Other Ambulatory Visit: Payer: BLUE CROSS/BLUE SHIELD

## 2014-04-13 ENCOUNTER — Telehealth: Payer: Self-pay | Admitting: *Deleted

## 2014-04-13 ENCOUNTER — Ambulatory Visit: Payer: BLUE CROSS/BLUE SHIELD | Admitting: Hematology

## 2014-04-13 LAB — PROTEIN C, TOTAL: Protein C, Total: 78 % (ref 72–160)

## 2014-04-13 LAB — PROTEIN C ACTIVITY: Protein C Activity: 103 % (ref 75–133)

## 2014-04-13 NOTE — Telephone Encounter (Signed)
On Morphine.  Jesse Bowers saw neurologist Dr [name] yesterday and he wanted to add flexeril to medication profile for sleep. Clinical trial for pts with PTSD with low dose flexeril 5 mg at bedtime.  Jesse Bowers asking if that is ok? (looks like Dr [name] placed the order already)

## 2014-04-13 NOTE — Telephone Encounter (Signed)
That's fine. I appreciate that he called.

## 2014-04-14 LAB — CHROMOSOME ANALYSIS, BONE MARROW

## 2014-04-14 NOTE — Telephone Encounter (Signed)
Notified Zeke.

## 2014-04-17 ENCOUNTER — Other Ambulatory Visit: Payer: Self-pay

## 2014-04-18 ENCOUNTER — Ambulatory Visit (INDEPENDENT_AMBULATORY_CARE_PROVIDER_SITE_OTHER): Payer: BLUE CROSS/BLUE SHIELD | Admitting: Psychology

## 2014-04-18 ENCOUNTER — Encounter: Payer: Self-pay | Admitting: Neurology

## 2014-04-18 DIAGNOSIS — F4310 Post-traumatic stress disorder, unspecified: Secondary | ICD-10-CM

## 2014-04-19 ENCOUNTER — Encounter: Payer: Self-pay | Admitting: Neurology

## 2014-04-19 ENCOUNTER — Encounter: Payer: Self-pay | Admitting: Hematology

## 2014-04-19 NOTE — Telephone Encounter (Signed)
Message information forwarded to Dr. Burr Medico.

## 2014-04-20 ENCOUNTER — Encounter: Payer: Self-pay | Admitting: Neurology

## 2014-04-20 ENCOUNTER — Ambulatory Visit (INDEPENDENT_AMBULATORY_CARE_PROVIDER_SITE_OTHER): Payer: BLUE CROSS/BLUE SHIELD | Admitting: Neurology

## 2014-04-20 DIAGNOSIS — R413 Other amnesia: Secondary | ICD-10-CM

## 2014-04-20 DIAGNOSIS — R442 Other hallucinations: Secondary | ICD-10-CM

## 2014-04-20 NOTE — Procedures (Signed)
    History:  Jesse Bowers is a 48 year old patient with a history of old factory hallucinations associated with the smell of tobacco smoke, followed by headache. There have been reports as well of progressive memory changes. The patient complains of chronic fatigue. The patient is being evaluated for possible seizure events.  This is a routine EEG. No skull defects are noted. Medications include Wellbutrin, BuSpar, Flexeril, diclofenac, Enbrel, MS Contin, Prazocin, Xarelto, Senokot, and testosterone.   EEG classification: Normal awake and drowsy  Description of the recording: The background rhythms of this recording consists of a fairly well modulated medium amplitude alpha rhythm of 10 Hz that is reactive to eye opening and closure. As the record progresses, the patient appears to remain in the waking state throughout the recording. Photic stimulation was performed, resulting in a bilateral and symmetric photic driving response. Hyperventilation was not performed. Toward the end of the recording, the patient enters the drowsy state with slight symmetric slowing seen. The patient never enters stage II sleep. At no time during the recording does there appear to be evidence of spike or spike wave discharges or evidence of focal slowing. EKG monitor shows no evidence of cardiac rhythm abnormalities with a heart rate of 72.  Impression: This is a normal EEG recording in the waking and drowsy state. No evidence of ictal or interictal discharges are seen.

## 2014-04-20 NOTE — Telephone Encounter (Signed)
Noted collaborative nurse has replied to patient information from Dr. Burr Medico.  Okay to take but needs to watch for bleeding due to no data on these agents with Xarelto.

## 2014-04-21 NOTE — Telephone Encounter (Signed)
LMOM identified vm that EEG is normal; there is no need for him to return my call unless he has a question/fim

## 2014-04-25 ENCOUNTER — Ambulatory Visit (INDEPENDENT_AMBULATORY_CARE_PROVIDER_SITE_OTHER): Payer: BLUE CROSS/BLUE SHIELD | Admitting: Psychology

## 2014-04-25 DIAGNOSIS — F4323 Adjustment disorder with mixed anxiety and depressed mood: Secondary | ICD-10-CM

## 2014-04-27 ENCOUNTER — Other Ambulatory Visit: Payer: BLUE CROSS/BLUE SHIELD

## 2014-04-27 ENCOUNTER — Ambulatory Visit: Payer: BLUE CROSS/BLUE SHIELD | Admitting: Hematology

## 2014-05-02 ENCOUNTER — Ambulatory Visit (INDEPENDENT_AMBULATORY_CARE_PROVIDER_SITE_OTHER): Payer: BLUE CROSS/BLUE SHIELD | Admitting: Psychology

## 2014-05-02 ENCOUNTER — Encounter: Payer: Self-pay | Admitting: Registered Nurse

## 2014-05-02 ENCOUNTER — Encounter: Payer: BLUE CROSS/BLUE SHIELD | Attending: Physical Medicine & Rehabilitation | Admitting: Registered Nurse

## 2014-05-02 VITALS — BP 136/70 | HR 100 | Resp 14

## 2014-05-02 DIAGNOSIS — D6859 Other primary thrombophilia: Secondary | ICD-10-CM | POA: Diagnosis not present

## 2014-05-02 DIAGNOSIS — I69320 Aphasia following cerebral infarction: Secondary | ICD-10-CM | POA: Diagnosis not present

## 2014-05-02 DIAGNOSIS — Z7901 Long term (current) use of anticoagulants: Secondary | ICD-10-CM | POA: Insufficient documentation

## 2014-05-02 DIAGNOSIS — R209 Unspecified disturbances of skin sensation: Secondary | ICD-10-CM | POA: Insufficient documentation

## 2014-05-02 DIAGNOSIS — M13862 Other specified arthritis, left knee: Secondary | ICD-10-CM | POA: Insufficient documentation

## 2014-05-02 DIAGNOSIS — R269 Unspecified abnormalities of gait and mobility: Secondary | ICD-10-CM

## 2014-05-02 DIAGNOSIS — G894 Chronic pain syndrome: Secondary | ICD-10-CM | POA: Diagnosis not present

## 2014-05-02 DIAGNOSIS — Z5181 Encounter for therapeutic drug level monitoring: Secondary | ICD-10-CM | POA: Diagnosis not present

## 2014-05-02 DIAGNOSIS — F4310 Post-traumatic stress disorder, unspecified: Secondary | ICD-10-CM | POA: Diagnosis not present

## 2014-05-02 DIAGNOSIS — F418 Other specified anxiety disorders: Secondary | ICD-10-CM | POA: Insufficient documentation

## 2014-05-02 DIAGNOSIS — Z87890 Personal history of sex reassignment: Secondary | ICD-10-CM | POA: Insufficient documentation

## 2014-05-02 DIAGNOSIS — Z86718 Personal history of other venous thrombosis and embolism: Secondary | ICD-10-CM | POA: Insufficient documentation

## 2014-05-02 DIAGNOSIS — I69351 Hemiplegia and hemiparesis following cerebral infarction affecting right dominant side: Secondary | ICD-10-CM | POA: Diagnosis not present

## 2014-05-02 DIAGNOSIS — G8100 Flaccid hemiplegia affecting unspecified side: Secondary | ICD-10-CM

## 2014-05-02 DIAGNOSIS — Z79891 Long term (current) use of opiate analgesic: Secondary | ICD-10-CM | POA: Diagnosis not present

## 2014-05-02 DIAGNOSIS — G8929 Other chronic pain: Secondary | ICD-10-CM | POA: Insufficient documentation

## 2014-05-02 DIAGNOSIS — M545 Low back pain: Secondary | ICD-10-CM | POA: Diagnosis not present

## 2014-05-02 DIAGNOSIS — Z79899 Other long term (current) drug therapy: Secondary | ICD-10-CM

## 2014-05-02 DIAGNOSIS — I69398 Other sequelae of cerebral infarction: Secondary | ICD-10-CM | POA: Insufficient documentation

## 2014-05-02 DIAGNOSIS — S43002A Unspecified subluxation of left shoulder joint, initial encounter: Secondary | ICD-10-CM | POA: Insufficient documentation

## 2014-05-02 DIAGNOSIS — M7522 Bicipital tendinitis, left shoulder: Secondary | ICD-10-CM

## 2014-05-02 DIAGNOSIS — D751 Secondary polycythemia: Secondary | ICD-10-CM | POA: Insufficient documentation

## 2014-05-02 DIAGNOSIS — K900 Celiac disease: Secondary | ICD-10-CM | POA: Diagnosis not present

## 2014-05-02 DIAGNOSIS — Z9181 History of falling: Secondary | ICD-10-CM | POA: Diagnosis not present

## 2014-05-02 DIAGNOSIS — G8101 Flaccid hemiplegia affecting right dominant side: Secondary | ICD-10-CM

## 2014-05-02 DIAGNOSIS — L4050 Arthropathic psoriasis, unspecified: Secondary | ICD-10-CM | POA: Diagnosis not present

## 2014-05-02 MED ORDER — MORPHINE SULFATE ER 30 MG PO TBCR: 30.0000 mg | EXTENDED_RELEASE_TABLET | Freq: Three times a day (TID) | ORAL | Status: DC

## 2014-05-02 NOTE — Progress Notes (Signed)
Subjective:    Patient ID: Jesse Bowers, male    DOB: April 30, 1965, 49 y.o.   MRN: [id number]  HPI: [name] is a 49 year old male who returns for follow-up and medication refill. He says his pain is located in his left shoulder, bilateral knees right greater than left, and bilateral feet. He rates his pain 6. His current exercise regime is yoga weekly. He has an appointment with Neuro- Rehabilitation on 05/08/14. S/P Left Shoulder injection with good relief noted. Pain Inventory Average Pain 5 Pain Right Now 6 My pain is constant, sharp, burning, dull and aching  In the last 24 hours, has pain interfered with the following? General activity 5 Relation with others 8 Enjoyment of life 9 What TIME of day is your pain at its worst? morning and night Sleep (in general) Poor  Pain is worse with: walking, bending, standing and some activites Pain improves with: medication and injections Relief from Meds: 5  Mobility walk without assistance how many minutes can you walk? 2-3 ability to climb steps?  yes do you drive?  yes  Function disabled: date disabled .  Neuro/Psych weakness numbness trouble walking spasms dizziness confusion anxiety  Prior Studies Any changes since last visit?  no  Physicians involved in your care Any changes since last visit?  no   Family History  Problem Relation Age of Onset  . Stroke Paternal Uncle     age 64  . Stroke Maternal Grandfather     67  . Hypertension Mother   . Psoriasis Mother   . Stroke Maternal Grandmother   . Congestive Heart Failure Maternal Grandmother   . Cancer Paternal Grandfather   . Protein C deficiency Sister 64    Miscarriages   History   Social History  . Marital Status: Married    Spouse Name: N/A  . Number of Children: 2  . 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 Smoking status: Never  Smoker   . Smokeless tobacco: Not on file  . Alcohol Use: Yes     Comment: wine once a month  . Drug Use: No  . Sexual Activity: Yes    Birth Control/ Protection: None     Comment: patient is a transgender on testosterone shots, no biological kids   Other Topics Concern  . 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 Relocated to [place] about 2 months ago from [place], [place]. Patient was in [place] for last 4 years and prior to that in [place]. His wife is working as Scientist, research (physical sciences) for [employer]. Patient is not working and applying for disability. They have 2 kids but no biologic children.    Past Surgical History  Procedure Laterality Date  . Thyroidectomy, partial  2008  . Cholecystectomy    . Ankle arthroscopy with reconstruction Right 2007  . Knee surgery Bilateral 1984    Right ACL, left PCL repair  . Liver biopsy  2013    normal results.  . Hip arthroscopy w/ labral repair Right 05/11/2013   Past Medical History  Diagnosis Date  . psoriatic arthritis   . H/O protein C deficiency   . Hx-TIA (transient ischemic attack)   . Clotting disorder   .  Celiac disease   . H/O parotitis     right   . Polycythemia, secondary   . Transgendered   . Sleep apnea   . Neck pain   . Abnormal weight loss   . Gluten enteropathy   . Syrinx of spinal cord 01/06/2014 on MRI    c spine  . Anxiety     PTSD per patient   BP 136/70 mmHg  Pulse 100  Resp 14  SpO2 97%  Opioid Risk Score:   Fall Risk Score: Low Fall Risk (0-5 points)  Review of Systems  HENT: Negative.   Eyes: Negative.   Respiratory: Negative.   Cardiovascular: Negative.   Gastrointestinal: Negative.   Endocrine: Negative.   Genitourinary: Negative.   Musculoskeletal: Positive for myalgias, back pain and arthralgias.       Pain in hands, knees and right foot  Skin: Negative.     Allergic/Immunologic: Negative.   Neurological: Positive for dizziness, weakness and numbness.       Trouble walking, spasms  Hematological: Negative.   Psychiatric/Behavioral: Positive for confusion. The patient is nervous/anxious.        Objective:   Physical Exam  Constitutional: He is oriented to person, place, and time. He appears well-developed and well-nourished.  HENT:  Head: Normocephalic and atraumatic.  Neck: Normal range of motion. Neck supple.  Cardiovascular: Normal rate and regular rhythm.   Pulmonary/Chest: Effort normal and breath sounds normal.  Musculoskeletal:  Normal Muscle Bulk and Muscle Testing Reveals: Upper Extremities: Full ROM and Muscle strength 5/5 Right AC Joint Tenderness Lumbar Paraspinal tenderness: L-3- L-4 Lower Extremities: Full ROM and Muscle strength 5/5 Right Lower Extremity with AFO/ Flexion produces pain into Patella Left Lower Extremity Flexion Produces pain into Lateral Joint Line Arises from chair with ease/ Using three Rockne Menghini for support   Neurological: He is alert and oriented to person, place, and time.  Skin: Skin is warm and dry.  Psychiatric: He has a normal mood and affect.  Nursing note and vitals reviewed.         Assessment & Plan:  1. Psoriatic arthritis with pain in multiple areas, most prominently feet, hands, elbows.: Refilled:   2. Prior left sided CVA ('s) most substantial of which in May 2015 with residual right sided weakness, sensory loss, and expressive language deficits.: Referral to Neuro-Rehab  On 05/08/14. 3. Patello-femoral arthritis left knee: Continue Voltaren Gel 4. Chronic low back pain: Continue current medication regime, and encourage to increase activity as tolerated. 5. Polycythemia: PCP Following.  6. Depression with anxiety : Continue Wellbutrin and Buspar    20 minutes of face to face patient care time was spent during this visit. All questions were encouraged and answered.   F/U in  1 month

## 2014-05-03 ENCOUNTER — Ambulatory Visit: Payer: BLUE CROSS/BLUE SHIELD

## 2014-05-08 ENCOUNTER — Ambulatory Visit: Payer: BLUE CROSS/BLUE SHIELD | Admitting: Physical Therapy

## 2014-05-09 ENCOUNTER — Encounter (HOSPITAL_COMMUNITY): Payer: Self-pay

## 2014-05-09 ENCOUNTER — Other Ambulatory Visit: Payer: Self-pay | Admitting: *Deleted

## 2014-05-09 ENCOUNTER — Ambulatory Visit (INDEPENDENT_AMBULATORY_CARE_PROVIDER_SITE_OTHER): Payer: BLUE CROSS/BLUE SHIELD | Admitting: Psychology

## 2014-05-09 DIAGNOSIS — F4310 Post-traumatic stress disorder, unspecified: Secondary | ICD-10-CM

## 2014-05-09 DIAGNOSIS — D751 Secondary polycythemia: Secondary | ICD-10-CM

## 2014-05-10 ENCOUNTER — Other Ambulatory Visit (HOSPITAL_BASED_OUTPATIENT_CLINIC_OR_DEPARTMENT_OTHER): Payer: BLUE CROSS/BLUE SHIELD

## 2014-05-10 ENCOUNTER — Ambulatory Visit: Payer: BLUE CROSS/BLUE SHIELD | Admitting: Psychology

## 2014-05-10 ENCOUNTER — Ambulatory Visit: Payer: BLUE CROSS/BLUE SHIELD

## 2014-05-10 ENCOUNTER — Ambulatory Visit: Payer: BLUE CROSS/BLUE SHIELD | Attending: Physical Medicine & Rehabilitation | Admitting: Physical Therapy

## 2014-05-10 DIAGNOSIS — M6281 Muscle weakness (generalized): Secondary | ICD-10-CM | POA: Insufficient documentation

## 2014-05-10 DIAGNOSIS — D751 Secondary polycythemia: Secondary | ICD-10-CM

## 2014-05-10 DIAGNOSIS — R269 Unspecified abnormalities of gait and mobility: Secondary | ICD-10-CM | POA: Insufficient documentation

## 2014-05-10 LAB — CBC WITH DIFFERENTIAL/PLATELET
BASO%: 0.1 % (ref 0.0–2.0)
BASOS ABS: 0 10*3/uL (ref 0.0–0.1)
EOS%: 0.1 % (ref 0.0–7.0)
Eosinophils Absolute: 0 10*3/uL (ref 0.0–0.5)
HEMATOCRIT: 43.2 % (ref 38.4–49.9)
HGB: 13.8 g/dL (ref 13.0–17.1)
LYMPH%: 32.4 % (ref 14.0–49.0)
MCH: 28.3 pg (ref 27.2–33.4)
MCHC: 32 g/dL (ref 32.0–36.0)
MCV: 88.4 fL (ref 79.3–98.0)
MONO#: 0.4 10*3/uL (ref 0.1–0.9)
MONO%: 10.8 % (ref 0.0–14.0)
NEUT%: 56.6 % (ref 39.0–75.0)
NEUTROS ABS: 2 10*3/uL (ref 1.5–6.5)
PLATELETS: 174 10*3/uL (ref 140–400)
RBC: 4.89 10*6/uL (ref 4.20–5.82)
RDW: 12.2 % (ref 11.0–14.6)
WBC: 3.6 10*3/uL — ABNORMAL LOW (ref 4.0–10.3)
lymph#: 1.2 10*3/uL (ref 0.9–3.3)

## 2014-05-10 NOTE — Progress Notes (Signed)
No need for phlebotomy today with HCT 43.2.  Per Dr. Ernestina Penna office note from 04/12/14 phlebotomy only needed for HCT > 46.  Pt given copy of lab work and verbalized understanding. Dr. Burr Medico notified.

## 2014-05-10 NOTE — Patient Instructions (Signed)
HIP / KNEE: Extension - Sit to Stand   Sitting, lean chest forward, raise hips up from surface. Straighten hips and knees. Weight bear equally on left and right sides. Backs of legs should not push off surface. ___ reps per set, ___ sets per day, ___ days per week Use assistive device as needed.  Copyright  VHI. All rights reserved.  Weight Shift: Lateral (Limits of Stability)   Slowly shift weight to right as far as possible, without taking a step. Return to starting position. Shift to opposite side. Hold each position ____ seconds. Repeat ____ times per session. Do ____ sessions per day. Repeat on compliant surface: ________.  Copyright  VHI. All rights reserved.  Weight Shift: Lateral (Limits of Stability)   Slowly shift weight to right as far as possible, without taking a step. Return to starting position. Shift to opposite side. Hold each position ____ seconds. Repeat ____ times per session. Do ____ sessions per day. Repeat on compliant surface: ________.  Copyright  VHI. All rights reserved.

## 2014-05-10 NOTE — Therapy (Signed)
[facility] [address] [place], [place], [address] Phone: [phone]   Fax:  [phone]  Physical Therapy Evaluation  Patient Details  Name: Jesse Bowers MRN: [id number] Date of Birth: 13-Jun-1965 Referring Provider:  Meredith Staggers, MD  Encounter Date: 05/10/2014      PT End of Session - 05/10/14 1325    Visit Number 1   Number of Visits 17  per POC in eval, 2x/wk 8 wks plus eval   Date for PT Re-Evaluation 07/09/14   PT Start Time 1148   PT Stop Time 1230   PT Time Calculation (min) 42 min   Activity Tolerance Patient tolerated treatment well      Past Medical History  Diagnosis Date  . psoriatic arthritis   . H/O protein C deficiency   . Hx-TIA (transient ischemic attack)   . Clotting disorder   . Celiac disease   . H/O parotitis     right   . Polycythemia, secondary   . Transgendered   . Sleep apnea   . Neck pain   . Abnormal weight loss   . Gluten enteropathy   . Syrinx of spinal cord 01/06/2014 on MRI    c spine  . Anxiety     PTSD per patient    Past Surgical History  Procedure Laterality Date  . Thyroidectomy, partial  2008  . Cholecystectomy    . Ankle arthroscopy with reconstruction Right 2007  . Knee surgery Bilateral 1984    Right ACL, left PCL repair  . Liver biopsy  2013    normal results.  . Hip arthroscopy w/ labral repair Right 05/11/2013    There were no vitals taken for this visit.  Visit Diagnosis:  Muscle weakness  Abnormality of gait      Subjective Assessment - 05/10/14 1150    Symptoms Pt reports he had a CVA/TIA May 2015 with residual weakness in R side.  He reports R labral tear in hip March 2015, but was unable to complete therapy due to moving to [place].  He has had several falls in the past 6t months.  He wears R AFO, which is new since January.  Falls have occurred due to R foot catching on ground.  He has a cane, which he uses occasionally.   Pertinent History R labral tear of hip (no restrictions per pt report), CVA L frontal lobe   Patient Stated Goals Pt's goal for therapy is to not fall anymore and improve strength and stamina.   Currently in Pain? Yes   Pain Score 6    Pain Location Knee   Pain Orientation Right   Pain Descriptors / Indicators Aching;Sharp   Pain Type Acute pain   Pain Onset 1 to 4 weeks ago   Pain Frequency Occasional   Aggravating Factors  walking pattern with increased inversion   Pain Relieving Factors pain meds, Voltaran gel          [facility] PT Assessment - 05/10/14 1156    Assessment   Medical Diagnosis CVA with R weakness   Onset Date --  May 2015   Precautions   Precautions Fall   Balance Screen   Has the patient fallen in the past 6 months Yes   How many times? 8   Has the patient had a decrease in activity level because of a fear of falling?  Yes   Is the patient reluctant to leave their home because of a fear of falling?  Yes   [place] Private residence   Living Arrangements Alone   Available Help at Discharge Family   Type of [place] to enter   Entrance Stairs-Number of Steps 2   Entrance Stairs-Rails None   Home Layout Two level;Bed/bath upstairs   Alternate Level Stairs-Number of Steps --  flight   Alternate Level Stairs-Rails Right   [place] - single point   Prior Function   Level of Independence Independent with gait;Independent with basic ADLs;Independent with transfers  wife assists with shower   Vocation On disability  arthritis, fatigue   Leisure difficulty with ability to shop in large stores   Observation/Other Assessments   Focus on Therapeutic Outcomes (FOTO)  NA   ROM / Strength   AROM / PROM / Strength Strength   Strength   Strength Assessment Site Hip;Knee;Ankle   Right/Left Hip Right;Left   Right Hip Flexion 4-/5   Right Hip Extension 3+/5   Right Hip ABduction 3+/5   Left Hip Flexion  3+/5   Left Hip Extension 3+/5   Left Hip ABduction 4/5   Right/Left Knee Right;Left   Right Knee Flexion 5/5   Right Knee Extension 3+/5   Left Knee Flexion 5/5   Left Knee Extension 3+/5   Right/Left Ankle Right;Left   Right Ankle Dorsiflexion 4/5   Right Ankle Inversion 4/5   Right Ankle Eversion 3+/5   Left Ankle Dorsiflexion 4/5   Left Ankle Inversion 4/5   Left Ankle Eversion 4/5   Transfers   Transfers Sit to Stand;Stand to Sit   Sit to Stand 6: Modified independent (Device/Increase time);With upper extremity assist  decreased weightshift ro RLE   Stand to Sit 6: Modified independent (Device/Increase time);With upper extremity assist;To chair/3-in-1   Ambulation/Gait   Ambulation/Gait Yes   Ambulation/Gait Assistance 5: Supervision   Ambulation/Gait Assistance Details Pt stands with increased weight-shift onto LLE.   Ambulation Distance (Feet) 200 Feet   Assistive device None  wearing R AFO   Gait Pattern Decreased step length - right;Decreased stance time - right;Decreased stance time - left;Decreased dorsiflexion - right;Decreased weight shift to right;Right circumduction;Trendelenburg;Lateral trunk lean to left  incr R hip internal rotation, inversion R foot   Ambulation Surface Level;Indoor   Gait velocity 13.67 sec  2.40 ft/sec   Stairs Yes   Stairs Assistance 5: Supervision   Stair Management Technique Two rails;Step to pattern;Sideways  sideways descending with L foot leading   Number of Stairs 4   Height of Stairs 6   Standardized Balance Assessment   Standardized Balance Assessment Timed Up and Go Test;Dynamic Gait Index   Dynamic Gait Index   Level Surface Moderate Impairment   Change in Gait Speed Moderate Impairment   Gait with Horizontal Head Turns Moderate Impairment   Gait with Vertical Head Turns Moderate Impairment   Gait and Pivot Turn Moderate Impairment   Step Over Obstacle Moderate Impairment   Step Around Obstacles Moderate Impairment    Steps Moderate Impairment   Total Score 8   Timed Up and Go Test   TUG Normal TUG   Normal TUG (seconds) 15.69                          PT Education - 05/10/14 1323    Education provided Yes   Education Details Equal weightshifting bilateral lower extremities with transfers and standing; HEP-lateral weightshifting, transfers with equal  weight-shifting   Person(s) Educated Patient   Methods Explanation;Demonstration;Handout   Comprehension Verbalized understanding;Returned demonstration          PT Short Term Goals - 05/10/14 1330    PT SHORT TERM GOAL #1   Title Pt will be independent with HEP for improved strength, balance, and gait.   Time 4   Period Weeks   Status New   PT SHORT TERM GOAL #2   Title Pt will improve Timed Up and Go score to less than or equal to 13.5 seconds for decreased fall risk.   Time 4   Period Weeks   Status New   PT SHORT TERM GOAL #3   Title Pt will improve Dynamic Gait Index score to at least 13/24 for decreased fall risk.   Time 4   Period Weeks   Status New   PT SHORT TERM GOAL #4   Title Pt will negotiate at least 4 steps with step-to pattern, modified independently for improved safety with stair negotiation.   Time 4   Period Weeks   Status New   PT SHORT TERM GOAL #5   Title Pt will verbalize understanding of CVA education-warning signs and risk factors.   Time 4   Period Weeks   Status New           PT Long Term Goals - 05/10/14 1333    PT LONG TERM GOAL #1   Title Pt will verbalize understanding of fall prevention within home environment.   Time 8   Period Weeks   Status New   PT LONG TERM GOAL #2   Title Pt will perform sit<>stand at least 8 of 10 reps with minimal to no UE support, for improved transfer efficiency and safety.   Time 8   Period Weeks   Status New   PT LONG TERM GOAL #3   Title Pt will improve Dynamic Gait Index score to at least 19/24 for decreased fall risk.   Time 8   Period  Weeks   Status New   PT LONG TERM GOAL #4   Title Pt will improve gait velocity to at least 2.62 ft/sec for improved gait efficiency and safety.   Time 8   Period Weeks   Status New   PT LONG TERM GOAL #5   Title Pt will verbalize plans for continued community fitness upon D/C from PT.   Time 8   Period Weeks   Status New               Plan - 05/10/14 1326    Clinical Impression Statement Pt is a 49 year old male who presents to OP PT status post CVA May 2015 with residual RLE weakness, gait and balance difficulty.  He has had at least 8 falls in the past 6 months, with R foot catching on floor.  He has received new R AFO, but continues to have increased hip internal rotation, increased inversion of RLE.  He is at fall risk per Timed Up and Go and Dynamic Gait Index scores.  He has an overall slowed gait velocity of 2.4 ft/sec.  He would benefit from skilled PT to address balance, strength, gait for improved functional mobility.   Pt will benefit from skilled therapeutic intervention in order to improve on the following deficits Abnormal gait;Decreased balance;Decreased mobility;Decreased knowledge of use of DME;Decreased endurance;Decreased safety awareness;Decreased strength;Difficulty walking;Pain   Rehab Potential Good   PT Frequency 2x / week   PT Duration  8 weeks  plus evaluation   PT Treatment/Interventions ADLs/Self Care Home Management;Gait training;Stair training;Functional mobility training;DME Instruction;Therapeutic activities;Therapeutic exercise;Balance training;Neuromuscular re-education;Patient/family education   PT Next Visit Plan Review sit<>stand and weighshifting, stair negotiation; initiate HEP-stregnth and balance   Consulted and Agree with Plan of Care Patient         Problem List Patient Active Problem List   Diagnosis Date Noted  . Syringomyelia 04/10/2014  . Abnormal finding on MRI of brain 04/10/2014  . Chronic pain syndrome 04/10/2014  .  Chronic fatigue 04/10/2014  . Numbness 04/10/2014  . OSA (obstructive sleep apnea) 04/10/2014  . Insomnia 04/10/2014  . Protein C deficiency 03/23/2014  . 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 Biceps tendonitis on left 03/01/2014  . Polycythemia, secondary 12/27/2013  . H/O TIA (transient ischemic attack) and stroke 12/27/2013  . Psoriatic arthritis 12/27/2013  . History of celiac disease 12/27/2013  . Weight loss, unintentional 12/27/2013  . H/O protein C deficiency 12/27/2013  . Transgendered 12/27/2013  . Sleep apnea 12/27/2013  . H/O parotitis 12/27/2013  . Neck pain 12/27/2013  . Depression with anxiety 12/27/2013  . DVT (deep venous thrombosis) 12/27/2013    Leodis Alcocer W. 05/10/2014, 1:39 PM  Zaydee Aina, PT 05/10/2014 1:40 PM Phone: [phone] Fax: [facility] [address] [place], [place], [address] Phone: [phone]   Fax:  [phone]

## 2014-05-11 ENCOUNTER — Ambulatory Visit (INDEPENDENT_AMBULATORY_CARE_PROVIDER_SITE_OTHER): Payer: BLUE CROSS/BLUE SHIELD | Admitting: Psychology

## 2014-05-11 ENCOUNTER — Ambulatory Visit: Payer: BLUE CROSS/BLUE SHIELD | Admitting: Physical Therapy

## 2014-05-11 ENCOUNTER — Ambulatory Visit: Payer: BLUE CROSS/BLUE SHIELD | Admitting: Psychology

## 2014-05-11 DIAGNOSIS — F4310 Post-traumatic stress disorder, unspecified: Secondary | ICD-10-CM | POA: Diagnosis not present

## 2014-05-11 DIAGNOSIS — M6281 Muscle weakness (generalized): Secondary | ICD-10-CM | POA: Diagnosis not present

## 2014-05-11 NOTE — Therapy (Signed)
[facility] [address] [place], [place], [address] Phone: [phone]   Fax:  [phone]  Physical Therapy Treatment  Patient Details  Name: Jesse Bowers MRN: [id number] Date of Birth: [birth year] Referring Provider:  Concepcion Elk, MD  Encounter Date: 05/11/2014      PT End of Session - 05/11/14 1405    Visit Number 2   Number of Visits 17   Date for PT Re-Evaluation 07/09/14   PT Start Time 2683   PT Stop Time 1402   PT Time Calculation (min) 38 min   Activity Tolerance Patient tolerated treatment well      Past Medical History  Diagnosis Date  . psoriatic arthritis   . H/O protein C deficiency   . Hx-TIA (transient ischemic attack)   . Clotting disorder   . Celiac disease   . H/O parotitis     right   . Polycythemia, secondary   . Transgendered   . Sleep apnea   . Neck pain   . Abnormal weight loss   . Gluten enteropathy   . Syrinx of spinal cord 01/06/2014 on MRI    c spine  . Anxiety     PTSD per patient    Past Surgical History  Procedure Laterality Date  . Thyroidectomy, partial  2008  . Cholecystectomy    . Ankle arthroscopy with reconstruction Right 2007  . Knee surgery Bilateral 1984    Right ACL, left PCL repair  . Liver biopsy  2013    normal results.  . Hip arthroscopy w/ labral repair Right 05/11/2013    There were no vitals filed for this visit.  Visit Diagnosis:  Muscle weakness      Subjective Assessment - 05/11/14 1325    Symptoms Saw orthopedist today and likely will have TKR in the upcoming months.   Currently in Pain? Yes   Pain Score 5    Pain Location Knee   Pain Orientation Left   Aggravating Factors  walking   Pain Relieving Factors pain meds, gel                       [facility] Adult PT Treatment/Exercise - 05/11/14 1328    Exercises   Exercises Knee/Hip   Knee/Hip Exercises: Stretches   Hip Flexor Stretch 2 reps;30 seconds   Knee/Hip  Exercises: Supine   Bridges 10 reps;Strengthening;2 sets  2nd set with ball squeeze   Straight Leg Raises AROM;Strengthening;Right;Left;10 reps;2 sets  2nd set 2# weights added   Straight Leg Raises Limitations cues for quad set and to keep R hip in neutral to avoid hip internal rotation   Other Supine Knee Exercises hooklying hip abduction wtih red theraband 10 reps   Other Supine Knee Exercises hip external rotation stretch 3 x 30 seconds on RLE   Knee/Hip Exercises: Sidelying   Hip ABduction AROM;Strengthening;10 reps;Right;Left                PT Education - 05/11/14 1404    Education provided Yes   Education Details HEP-SLR, sidelying hip abduction with 2# weights, hip flexor stretch, hip external rotator stretch   Person(s) Educated Patient   Methods Explanation;Demonstration;Handout   Comprehension Verbalized understanding;Returned demonstration          PT Short Term Goals - 05/10/14 1330    PT SHORT TERM GOAL #1   Title Pt will be independent with HEP for improved strength, balance, and gait.   Time 4  Period Weeks   Status New   PT SHORT TERM GOAL #2   Title Pt will improve Timed Up and Go score to less than or equal to 13.5 seconds for decreased fall risk.   Time 4   Period Weeks   Status New   PT SHORT TERM GOAL #3   Title Pt will improve Dynamic Gait Index score to at least 13/24 for decreased fall risk.   Time 4   Period Weeks   Status New   PT SHORT TERM GOAL #4   Title Pt will negotiate at least 4 steps with step-to pattern, modified independently for improved safety with stair negotiation.   Time 4   Period Weeks   Status New   PT SHORT TERM GOAL #5   Title Pt will verbalize understanding of CVA education-warning signs and risk factors.   Time 4   Period Weeks   Status New           PT Long Term Goals - 05/10/14 1333    PT LONG TERM GOAL #1   Title Pt will verbalize understanding of fall prevention within home environment.   Time 8    Period Weeks   Status New   PT LONG TERM GOAL #2   Title Pt will perform sit<>stand at least 8 of 10 reps with minimal to no UE support, for improved transfer efficiency and safety.   Time 8   Period Weeks   Status New   PT LONG TERM GOAL #3   Title Pt will improve Dynamic Gait Index score to at least 19/24 for decreased fall risk.   Time 8   Period Weeks   Status New   PT LONG TERM GOAL #4   Title Pt will improve gait velocity to at least 2.62 ft/sec for improved gait efficiency and safety.   Time 8   Period Weeks   Status New   PT LONG TERM GOAL #5   Title Pt will verbalize plans for continued community fitness upon D/C from PT.   Time 8   Period Weeks   Status New               Plan - 05/11/14 1406    Clinical Impression Statement Initiated HEP for leg stretching and strengthening today   Pt will benefit from skilled therapeutic intervention in order to improve on the following deficits Abnormal gait;Decreased balance;Decreased mobility;Decreased knowledge of use of DME;Decreased endurance;Decreased safety awareness;Decreased strength;Difficulty walking;Pain   Rehab Potential Good   PT Frequency 2x / week   PT Duration 8 weeks   PT Treatment/Interventions ADLs/Self Care Home Management;Gait training;Stair training;Functional mobility training;DME Instruction;Therapeutic activities;Therapeutic exercise;Balance training;Neuromuscular re-education;Patient/family education  wk 1 of 8   PT Next Visit Plan review and progress HEP; stair negotiation and gait training        Problem List Patient Active Problem List   Diagnosis Date Noted  . Syringomyelia 04/10/2014  . Abnormal finding on MRI of brain 04/10/2014  . Chronic pain syndrome 04/10/2014  . Chronic fatigue 04/10/2014  . Numbness 04/10/2014  . OSA (obstructive sleep apnea) 04/10/2014  . Insomnia 04/10/2014  . Protein C deficiency 03/23/2014  . 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 Biceps tendonitis on  left 03/01/2014  . Polycythemia, secondary 12/27/2013  . H/O TIA (transient ischemic attack) and stroke 12/27/2013  . Psoriatic arthritis 12/27/2013  . History of celiac disease 12/27/2013  . Weight loss, unintentional 12/27/2013  . H/O protein C deficiency 12/27/2013  .  Transgendered 12/27/2013  . Sleep apnea 12/27/2013  . H/O parotitis 12/27/2013  . Neck pain 12/27/2013  . Depression with anxiety 12/27/2013  . DVT (deep venous thrombosis) 12/27/2013    Tonnia Bardin W. 05/11/2014, 2:07 PM  Mady Haagensen, PT 05/11/2014 2:08 PM Phone: [phone] Fax: [facility] [address] [place], [place], [address] Phone: [phone]   Fax:  [phone]

## 2014-05-11 NOTE — Patient Instructions (Signed)
HIP: Flexion / KNEE: Extension, Straight Leg Raise   Raise leg, keeping knee straight. Place 2# weight on ankles. Slowly lift leg about 6 inches from bed.  Hold in the air 3 seconds.  10___ reps per set, _2__ sets per day,  Copyright  VHI. All rights reserved.  HIP: Abduction - Side-Lying (Weight)   Place weight on top leg. Squeeze glutes. Raise leg up and slightly back, keeping leg in line with your body. Hold __3_ seconds. Use _2__ lb weight. _10_ reps per set, _2__ sets per day.  Bend bottom leg to stabilize pelvis.  Copyright  VHI. All rights reserved.  Hip Flexor Stretch   Lying on back near edge of bed or sofa, bend one leg, foot flat. Hang other leg over edge, relaxed, thigh resting entirely on bed for _2-3_ minutes. Repeat __3_ times. Do __several__ sessions per day. Advanced Exercise: Bend knee back keeping thigh in contact with bed.  http://gt2.exer.us/346   Copyright  VHI. All rights reserved.  HIP: External Rotation   Sit at edge of surface. (TRY to do this exercise lying down) Cross one leg over other knee. Press down gently on knee; stop when you feel a gentle stretch. Hold _30 seconds. _3__ reps per set, _1-2__ sets per dayCopyright  VHI. All rights reserved.

## 2014-05-15 ENCOUNTER — Ambulatory Visit: Payer: BLUE CROSS/BLUE SHIELD

## 2014-05-15 ENCOUNTER — Encounter: Payer: Self-pay | Admitting: Neurology

## 2014-05-16 ENCOUNTER — Ambulatory Visit (INDEPENDENT_AMBULATORY_CARE_PROVIDER_SITE_OTHER): Payer: BLUE CROSS/BLUE SHIELD | Admitting: Psychology

## 2014-05-16 DIAGNOSIS — F4310 Post-traumatic stress disorder, unspecified: Secondary | ICD-10-CM

## 2014-05-18 ENCOUNTER — Ambulatory Visit (INDEPENDENT_AMBULATORY_CARE_PROVIDER_SITE_OTHER): Payer: BLUE CROSS/BLUE SHIELD | Admitting: Psychology

## 2014-05-18 DIAGNOSIS — F4310 Post-traumatic stress disorder, unspecified: Secondary | ICD-10-CM | POA: Diagnosis not present

## 2014-05-19 ENCOUNTER — Encounter: Payer: Self-pay | Admitting: *Deleted

## 2014-05-23 ENCOUNTER — Ambulatory Visit (INDEPENDENT_AMBULATORY_CARE_PROVIDER_SITE_OTHER): Payer: BLUE CROSS/BLUE SHIELD | Admitting: Psychology

## 2014-05-23 DIAGNOSIS — F4310 Post-traumatic stress disorder, unspecified: Secondary | ICD-10-CM

## 2014-05-24 ENCOUNTER — Ambulatory Visit: Payer: BLUE CROSS/BLUE SHIELD | Admitting: Physical Therapy

## 2014-05-24 DIAGNOSIS — M6281 Muscle weakness (generalized): Secondary | ICD-10-CM | POA: Diagnosis not present

## 2014-05-25 ENCOUNTER — Ambulatory Visit (INDEPENDENT_AMBULATORY_CARE_PROVIDER_SITE_OTHER): Payer: BLUE CROSS/BLUE SHIELD | Admitting: Psychology

## 2014-05-25 ENCOUNTER — Ambulatory Visit: Payer: BLUE CROSS/BLUE SHIELD | Admitting: Physical Therapy

## 2014-05-25 DIAGNOSIS — R269 Unspecified abnormalities of gait and mobility: Secondary | ICD-10-CM

## 2014-05-25 DIAGNOSIS — M6281 Muscle weakness (generalized): Secondary | ICD-10-CM | POA: Diagnosis not present

## 2014-05-25 DIAGNOSIS — F4310 Post-traumatic stress disorder, unspecified: Secondary | ICD-10-CM | POA: Diagnosis not present

## 2014-05-25 NOTE — Therapy (Signed)
[facility] [address] [place], [place], [address] Phone: [phone]   Fax:  [phone]  Physical Therapy Treatment  Patient Details  Name: Jesse Bowers MRN: [id number] Date of Birth: [birth year] Referring Provider:  Concepcion Elk, MD  Encounter Date: 05/24/2014      PT End of Session - 05/25/14 1210    Visit Number 3   Number of Visits 17   Date for PT Re-Evaluation 07/09/14   PT Start Time 0935   PT Stop Time 1016   PT Time Calculation (min) 41 min   Activity Tolerance Patient tolerated treatment well      Past Medical History  Diagnosis Date  . psoriatic arthritis   . H/O protein C deficiency   . Hx-TIA (transient ischemic attack)   . Clotting disorder   . Celiac disease   . H/O parotitis     right   . Polycythemia, secondary   . Transgendered   . Sleep apnea   . Neck pain   . Abnormal weight loss   . Gluten enteropathy   . Syrinx of spinal cord 01/06/2014 on MRI    c spine  . Anxiety     PTSD per patient    Past Surgical History  Procedure Laterality Date  . Thyroidectomy, partial  2008  . Cholecystectomy    . Ankle arthroscopy with reconstruction Right 2007  . Knee surgery Bilateral 1984    Right ACL, left PCL repair  . Liver biopsy  2013    normal results.  . Hip arthroscopy w/ labral repair Right 05/11/2013    There were no vitals filed for this visit.  Visit Diagnosis:  Muscle weakness      Subjective Assessment - 05/25/14 1107    Currently in Pain? Yes   Pain Score 3    Pain Location Knee   Pain Orientation Left   Pain Descriptors / Indicators Aching;Sharp   Pain Onset 1 to 4 weeks ago   Pain Frequency Constant   Aggravating Factors  movement aggravates   Pain Relieving Factors nothing alleviates                       [facility] Adult PT Treatment/Exercise - 05/24/14 0939    Exercises   Exercises Knee/Hip   Knee/Hip Exercises: Stretches   Hip Flexor Stretch  2 reps;30 seconds  Review of HEP-pt independent   Knee/Hip Exercises: Seated   Long Arc Quad AROM;Strengthening;2 sets;10 reps  2nd set 2# weight   Other Seated Knee Exercises seated hamstring stretches with foot propped varied heights 3 x 30 seconds   Knee/Hip Exercises: Supine   Bridges Strengthening;1 set;10 reps   Straight Leg Raises AROM;Strengthening;Right;Left;10 reps  Review of HEP   Straight Leg Raises Limitations cues for quad set and to keep R hip in neutral to avoid hip internal rotation   Other Supine Knee Exercises hooklying hip abduction wtih red theraband 10 reps   Other Supine Knee Exercises hip external rotation stretch 3 x 30 seconds on RLE  review of HEP-pt independent   Knee/Hip Exercises: Sidelying   Hip ABduction AROM;Strengthening;Right;Left;1 set;10 reps  Review of HEP       Reviewed HEP, with pt performing exercises independently with occasional cues for technique, especially to keep RLE neutral with exercises.             PT Short Term Goals - 05/10/14 1330    PT SHORT TERM GOAL #1  Title Pt will be independent with HEP for improved strength, balance, and gait.   Time 4   Period Weeks   Status New   PT SHORT TERM GOAL #2   Title Pt will improve Timed Up and Go score to less than or equal to 13.5 seconds for decreased fall risk.   Time 4   Period Weeks   Status New   PT SHORT TERM GOAL #3   Title Pt will improve Dynamic Gait Index score to at least 13/24 for decreased fall risk.   Time 4   Period Weeks   Status New   PT SHORT TERM GOAL #4   Title Pt will negotiate at least 4 steps with step-to pattern, modified independently for improved safety with stair negotiation.   Time 4   Period Weeks   Status New   PT SHORT TERM GOAL #5   Title Pt will verbalize understanding of CVA education-warning signs and risk factors.   Time 4   Period Weeks   Status New           PT Long Term Goals - 05/10/14 1333    PT LONG TERM GOAL #1   Title  Pt will verbalize understanding of fall prevention within home environment.   Time 8   Period Weeks   Status New   PT LONG TERM GOAL #2   Title Pt will perform sit<>stand at least 8 of 10 reps with minimal to no UE support, for improved transfer efficiency and safety.   Time 8   Period Weeks   Status New   PT LONG TERM GOAL #3   Title Pt will improve Dynamic Gait Index score to at least 19/24 for decreased fall risk.   Time 8   Period Weeks   Status New   PT LONG TERM GOAL #4   Title Pt will improve gait velocity to at least 2.62 ft/sec for improved gait efficiency and safety.   Time 8   Period Weeks   Status New   PT LONG TERM GOAL #5   Title Pt will verbalize plans for continued community fitness upon D/C from PT.   Time 8   Period Weeks   Status New               Plan - 05/25/14 1210    Clinical Impression Statement Reviewed HEP and plan to add additional strengthening next visit.  Pt continues to demonstrate increased internal rotation of R hip and lower leg with gait and exercise activities   Pt will benefit from skilled therapeutic intervention in order to improve on the following deficits Abnormal gait;Decreased balance;Decreased mobility;Decreased knowledge of use of DME;Decreased endurance;Decreased safety awareness;Decreased strength;Difficulty walking;Pain   Rehab Potential Good   PT Frequency 2x / week   PT Duration 8 weeks   PT Treatment/Interventions ADLs/Self Care Home Management;Gait training;Stair training;Functional mobility training;DME Instruction;Therapeutic activities;Therapeutic exercise;Balance training;Neuromuscular re-education;Patient/family education   PT Next Visit Plan review and progress HEP; stair negotiation and gait training   Consulted and Agree with Plan of Care Patient        Problem List Patient Active Problem List   Diagnosis Date Noted  . Syringomyelia 04/10/2014  . Abnormal finding on MRI of brain 04/10/2014  . Chronic pain  syndrome 04/10/2014  . Chronic fatigue 04/10/2014  . Numbness 04/10/2014  . OSA (obstructive sleep apnea) 04/10/2014  . Insomnia 04/10/2014  . Protein C deficiency 03/23/2014  . 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 Biceps tendonitis  on left 03/01/2014  . Polycythemia, secondary 12/27/2013  . H/O TIA (transient ischemic attack) and stroke 12/27/2013  . Psoriatic arthritis 12/27/2013  . History of celiac disease 12/27/2013  . Weight loss, unintentional 12/27/2013  . H/O protein C deficiency 12/27/2013  . Transgendered 12/27/2013  . Sleep apnea 12/27/2013  . H/O parotitis 12/27/2013  . Neck pain 12/27/2013  . Depression with anxiety 12/27/2013  . DVT (deep venous thrombosis) 12/27/2013    Ovie Cornelio W. 05/25/2014, 12:13 PM  Mady Haagensen, PT 05/25/2014 12:14 PM Phone: [phone] Fax: [facility] [address] [place], [place], [address] Phone: [phone]   Fax:  [phone]

## 2014-05-25 NOTE — Patient Instructions (Signed)
Provided written handouts on LAQ, SLR with external rotation, seated hamstring curls, bridging, and hooklying hip abduction with red resisted band.  Working up to [employer] of 10 reps, 1-2 times per day.  Hamstring stretches 2-3 times per day, 3x 30 second hold.

## 2014-05-25 NOTE — Therapy (Signed)
[facility] [address] [place], [place], [address] Phone: [phone]   Fax:  [phone]  Physical Therapy Treatment  Patient Details  Name: Jesse Bowers MRN: [id number] Date of Birth: 1966/02/19 Referring Provider:  Concepcion Elk, MD  Encounter Date: 05/25/2014      PT End of Session - 05/25/14 2203    Visit Number 4   Number of Visits 17   Date for PT Re-Evaluation 07/09/14   PT Start Time 1106   PT Stop Time 1146   PT Time Calculation (min) 40 min   Equipment Utilized During Treatment Gait belt   Activity Tolerance Patient tolerated treatment well      Past Medical History  Diagnosis Date  . psoriatic arthritis   . H/O protein C deficiency   . Hx-TIA (transient ischemic attack)   . Clotting disorder   . Celiac disease   . H/O parotitis     right   . Polycythemia, secondary   . Transgendered   . Sleep apnea   . Neck pain   . Abnormal weight loss   . Gluten enteropathy   . Syrinx of spinal cord 01/06/2014 on MRI    c spine  . Anxiety     PTSD per patient    Past Surgical History  Procedure Laterality Date  . Thyroidectomy, partial  2008  . Cholecystectomy    . Ankle arthroscopy with reconstruction Right 2007  . Knee surgery Bilateral 1984    Right ACL, left PCL repair  . Liver biopsy  2013    normal results.  . Hip arthroscopy w/ labral repair Right 05/11/2013    There were no vitals filed for this visit.  Visit Diagnosis:  Muscle weakness  Abnormality of gait      Subjective Assessment - 05/25/14 1107    Currently in Pain? Yes   Pain Score 3    Pain Location Knee   Pain Orientation Left   Pain Descriptors / Indicators Aching;Sharp   Pain Onset 1 to 4 weeks ago   Pain Frequency Constant   Aggravating Factors  movement aggravates   Pain Relieving Factors nothing alleviates                       [facility] Adult PT Treatment/Exercise - 05/25/14 0001    Ambulation/Gait    Ambulation/Gait Yes   Ambulation/Gait Assistance 4: Min assist  >min guard   Ambulation/Gait Assistance Details Cues for and practice with correct cane sequence, using cane in R hand in sequence with L foot; pt needs multiple verbal and tactile cues for resetting cane sequenc    Ambulation Distance (Feet) 400 Feet   Assistive device Straight cane   Gait Pattern --  Pt needs multiple resets and cues for cane sequence   Gait Comments Treadmill gait 0.9 mph x 3 minutes with bilateral UE support with cues for increased step length, increased foot clearance, equal step length, equal step length   High Level Balance   High Level Balance Comments In parallel bars, forward/backward walking with visual cues to decrease hip circumduction.   Exercises   Exercises Knee/Hip   Knee/Hip Exercises: Stretches   Active Hamstring Stretch 1 rep;30 seconds  seated propped in chair   Knee/Hip Exercises: Seated   Long Arc Quad Strengthening;Right;Left;10 reps;Weights   Long Arc Quad Weight 2 lbs.   Long [employer] Limitations cues for neutral knee/hip positioning   Knee/Hip Exercises: Supine   Bridges Strengthening;10  reps   Straight Leg Raises AROM;Strengthening;Right;1 set;10 reps  with R foot toed out   Other Supine Knee Exercises hooklying hip abduction wtih red theraband 10 reps                PT Education - 05/25/14 2202    Education provided Yes   Education Details HEP-LAQ, seated hamstring stretch, SLR with external rotation, bridging, and hooklying resisted abduction   Person(s) Educated Patient   Methods Explanation;Demonstration;Handout   Comprehension Verbalized understanding;Returned demonstration          PT Short Term Goals - 05/10/14 1330    PT SHORT TERM GOAL #1   Title Pt will be independent with HEP for improved strength, balance, and gait.   Time 4   Period Weeks   Status New   PT SHORT TERM GOAL #2   Title Pt will improve Timed Up and Go score to less than or  equal to 13.5 seconds for decreased fall risk.   Time 4   Period Weeks   Status New   PT SHORT TERM GOAL #3   Title Pt will improve Dynamic Gait Index score to at least 13/24 for decreased fall risk.   Time 4   Period Weeks   Status New   PT SHORT TERM GOAL #4   Title Pt will negotiate at least 4 steps with step-to pattern, modified independently for improved safety with stair negotiation.   Time 4   Period Weeks   Status New   PT SHORT TERM GOAL #5   Title Pt will verbalize understanding of CVA education-warning signs and risk factors.   Time 4   Period Weeks   Status New           PT Long Term Goals - 05/10/14 1333    PT LONG TERM GOAL #1   Title Pt will verbalize understanding of fall prevention within home environment.   Time 8   Period Weeks   Status New   PT LONG TERM GOAL #2   Title Pt will perform sit<>stand at least 8 of 10 reps with minimal to no UE support, for improved transfer efficiency and safety.   Time 8   Period Weeks   Status New   PT LONG TERM GOAL #3   Title Pt will improve Dynamic Gait Index score to at least 19/24 for decreased fall risk.   Time 8   Period Weeks   Status New   PT LONG TERM GOAL #4   Title Pt will improve gait velocity to at least 2.62 ft/sec for improved gait efficiency and safety.   Time 8   Period Weeks   Status New   PT LONG TERM GOAL #5   Title Pt will verbalize plans for continued community fitness upon D/C from PT.   Time 8   Period Weeks   Status New               Plan - 05/25/14 2204    Clinical Impression Statement Pt reports overall decreased pain and is noted to have decreased circumduction and decreased internal rotation on RLE with gait when using cane/LLE in correct sequence (versus his pattern of R hand holding cane with RLE stipping).  Pt will continue to beneift from [facility] skilled PT to address balance, gait and strengthening.   Pt will benefit from skilled therapeutic intervention in order to  improve on the following deficits Abnormal gait;Decreased balance;Decreased mobility;Decreased knowledge of use of DME;Decreased endurance;Decreased safety  awareness;Decreased strength;Difficulty walking;Pain   Rehab Potential Good   PT Frequency 2x / week   PT Duration 8 weeks   PT Treatment/Interventions ADLs/Self Care Home Management;Gait training;Stair training;Functional mobility training;DME Instruction;Therapeutic activities;Therapeutic exercise;Balance training;Neuromuscular re-education;Patient/family education   PT Next Visit Plan continued gait training with proper cane sequence, treadmill gait training   Consulted and Agree with Plan of Care Patient        Problem List Patient Active Problem List   Diagnosis Date Noted  . Syringomyelia 04/10/2014  . Abnormal finding on MRI of brain 04/10/2014  . Chronic pain syndrome 04/10/2014  . Chronic fatigue 04/10/2014  . Numbness 04/10/2014  . OSA (obstructive sleep apnea) 04/10/2014  . Insomnia 04/10/2014  . Protein C deficiency 03/23/2014  . 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 Biceps tendonitis on left 03/01/2014  . Polycythemia, secondary 12/27/2013  . H/O TIA (transient ischemic attack) and stroke 12/27/2013  . Psoriatic arthritis 12/27/2013  . History of celiac disease 12/27/2013  . Weight loss, unintentional 12/27/2013  . H/O protein C deficiency 12/27/2013  . Transgendered 12/27/2013  . Sleep apnea 12/27/2013  . H/O parotitis 12/27/2013  . Neck pain 12/27/2013  . Depression with anxiety 12/27/2013  . DVT (deep venous thrombosis) 12/27/2013    Phyllistine Domingos W. 05/25/2014, 10:10 PM  Mady Haagensen, PT 05/25/2014 10:12 PM Phone: [phone] Fax: [facility] [address] [place], [place], [address] Phone: [phone]   Fax:  [phone]

## 2014-05-27 ENCOUNTER — Other Ambulatory Visit: Payer: Self-pay | Admitting: Nurse Practitioner

## 2014-05-29 ENCOUNTER — Ambulatory Visit: Payer: BLUE CROSS/BLUE SHIELD | Admitting: Physical Therapy

## 2014-05-29 DIAGNOSIS — R269 Unspecified abnormalities of gait and mobility: Secondary | ICD-10-CM

## 2014-05-29 DIAGNOSIS — M6281 Muscle weakness (generalized): Secondary | ICD-10-CM | POA: Diagnosis not present

## 2014-05-29 NOTE — Therapy (Signed)
[facility] [address] [place], [place], [address] Phone: [phone]   Fax:  [phone]  Physical Therapy Treatment  Patient Details  Name: Jesse Bowers MRN: [id number] Date of Birth: Dec 29, 1965 Referring Provider:  Concepcion Elk, MD  Encounter Date: 05/29/2014      PT End of Session - 05/29/14 0947    Visit Number 5   Number of Visits 17   Date for PT Re-Evaluation 07/09/14   PT Start Time 0852   PT Stop Time 0931   PT Time Calculation (min) 39 min   Equipment Utilized During Treatment Gait belt      Past Medical History  Diagnosis Date  . psoriatic arthritis   . H/O protein C deficiency   . Hx-TIA (transient ischemic attack)   . Clotting disorder   . Celiac disease   . H/O parotitis     right   . Polycythemia, secondary   . Transgendered   . Sleep apnea   . Neck pain   . Abnormal weight loss   . Gluten enteropathy   . Syrinx of spinal cord 01/06/2014 on MRI    c spine  . Anxiety     PTSD per patient    Past Surgical History  Procedure Laterality Date  . Thyroidectomy, partial  2008  . Cholecystectomy    . Ankle arthroscopy with reconstruction Right 2007  . Knee surgery Bilateral 1984    Right ACL, left PCL repair  . Liver biopsy  2013    normal results.  . Hip arthroscopy w/ labral repair Right 05/11/2013    There were no vitals filed for this visit.  Visit Diagnosis:  Abnormality of gait      Subjective Assessment - 05/29/14 0853    Symptoms Haivng trouble sequencing cane with RUE and LLE.  Had a fall yesterday when trying to use the cane, no brace, in the house.  Puppy gets in the way with the cane.  Plans to go to orthotist after PT visit to see if they can adjust brace due to bar pressing into lower leg at times.   Currently in Pain? Yes   Pain Score 4    Pain Location Knee  R shoulder   Pain Orientation Left   Pain Descriptors / Indicators Aching   Pain Onset 1 to 4 weeks ago    Pain Frequency Constant   Aggravating Factors  movement aggravates   Pain Relieving Factors nothing                       [facility] Adult PT Treatment/Exercise - 05/29/14 0001    Ambulation/Gait   Ambulation/Gait Yes   Ambulation/Gait Assistance 4: Min guard   Ambulation/Gait Assistance Details Cues for appropriate cane sequence   Ambulation Distance (Feet) 600 Feet  400 ft with added environmental scanning   Assistive device Straight cane   Gait Pattern Step-through pattern;Decreased weight shift to left  internal rotation of lower extremities bilaterally   Ambulation Surface Level;Indoor   Knee/Hip Exercises: Aerobic   Tread Mill 0.9>1.1 mph with bilat UE support x 5 minutes  cues for upright posture and equal step length      Gait training for turns, using figure of 8 sequence x 4 reps with cane with min guard assistance, then gait 120 ft with cane and min guard assistance with added cognitive task.  Pt noted to have increased internal rotation on LLE especially today, needing frequent cues to  relax L UE from holding in mid-high guard position on L elbow flexion and hand fisted.  Pt is able to briefly relax with cues.            PT Short Term Goals - 05/10/14 1330    PT SHORT TERM GOAL #1   Title Pt will be independent with HEP for improved strength, balance, and gait.   Time 4   Period Weeks   Status New   PT SHORT TERM GOAL #2   Title Pt will improve Timed Up and Go score to less than or equal to 13.5 seconds for decreased fall risk.   Time 4   Period Weeks   Status New   PT SHORT TERM GOAL #3   Title Pt will improve Dynamic Gait Index score to at least 13/24 for decreased fall risk.   Time 4   Period Weeks   Status New   PT SHORT TERM GOAL #4   Title Pt will negotiate at least 4 steps with step-to pattern, modified independently for improved safety with stair negotiation.   Time 4   Period Weeks   Status New   PT SHORT TERM GOAL #5   Title Pt  will verbalize understanding of CVA education-warning signs and risk factors.   Time 4   Period Weeks   Status New           PT Long Term Goals - 05/10/14 1333    PT LONG TERM GOAL #1   Title Pt will verbalize understanding of fall prevention within home environment.   Time 8   Period Weeks   Status New   PT LONG TERM GOAL #2   Title Pt will perform sit<>stand at least 8 of 10 reps with minimal to no UE support, for improved transfer efficiency and safety.   Time 8   Period Weeks   Status New   PT LONG TERM GOAL #3   Title Pt will improve Dynamic Gait Index score to at least 19/24 for decreased fall risk.   Time 8   Period Weeks   Status New   PT LONG TERM GOAL #4   Title Pt will improve gait velocity to at least 2.62 ft/sec for improved gait efficiency and safety.   Time 8   Period Weeks   Status New   PT LONG TERM GOAL #5   Title Pt will verbalize plans for continued community fitness upon D/C from PT.   Time 8   Period Weeks   Status New               Plan - 05/29/14 0947    Clinical Impression Statement Pt is having slight improved ease of maintaing correct cane sequence with less episodes of incorrect sequence.  Pt continues to have difficulty sequencing cane with conversation tasks and with turns.  Pt is also noted to hold LUE in flexion and slightly fisted during gait activities.  Pt says this has been going on for a long while.  He is able to briefly relax it down with cues.   Pt will benefit from skilled therapeutic intervention in order to improve on the following deficits Abnormal gait;Decreased balance;Decreased mobility;Decreased knowledge of use of DME;Decreased endurance;Decreased safety awareness;Decreased strength;Difficulty walking;Pain   Rehab Potential Good   PT Frequency 2x / week   PT Duration 8 weeks  this is week 3 of 8   PT Treatment/Interventions ADLs/Self Care Home Management;Gait training;Stair training;Functional mobility training;DME  Instruction;Therapeutic activities;Therapeutic exercise;Balance  training;Neuromuscular re-education;Patient/family education   PT Next Visit Plan continued gait training with proper cane sequence, treadmill gait training; standing hip and lower extremity strengthening activities   Consulted and Agree with Plan of Care Patient        Problem List Patient Active Problem List   Diagnosis Date Noted  . Syringomyelia 04/10/2014  . Abnormal finding on MRI of brain 04/10/2014  . Chronic pain syndrome 04/10/2014  . Chronic fatigue 04/10/2014  . Numbness 04/10/2014  . OSA (obstructive sleep apnea) 04/10/2014  . Insomnia 04/10/2014  . Protein C deficiency 03/23/2014  . 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 Biceps tendonitis on left 03/01/2014  . Polycythemia, secondary 12/27/2013  . H/O TIA (transient ischemic attack) and stroke 12/27/2013  . Psoriatic arthritis 12/27/2013  . History of celiac disease 12/27/2013  . Weight loss, unintentional 12/27/2013  . H/O protein C deficiency 12/27/2013  . Transgendered 12/27/2013  . Sleep apnea 12/27/2013  . H/O parotitis 12/27/2013  . Neck pain 12/27/2013  . Depression with anxiety 12/27/2013  . DVT (deep venous thrombosis) 12/27/2013    MARRIOTT,AMY W. 05/29/2014, 9:53 AM  Mady Haagensen, PT 05/29/2014 9:55 AM Phone: [phone] Fax: [facility] [address] [place], [place], [address] Phone: [phone]   Fax:  [phone]

## 2014-05-30 ENCOUNTER — Ambulatory Visit (INDEPENDENT_AMBULATORY_CARE_PROVIDER_SITE_OTHER): Payer: BLUE CROSS/BLUE SHIELD | Admitting: Psychology

## 2014-05-30 DIAGNOSIS — F4310 Post-traumatic stress disorder, unspecified: Secondary | ICD-10-CM | POA: Diagnosis not present

## 2014-05-31 ENCOUNTER — Ambulatory Visit: Payer: Self-pay | Admitting: Physical Therapy

## 2014-06-01 ENCOUNTER — Ambulatory Visit (INDEPENDENT_AMBULATORY_CARE_PROVIDER_SITE_OTHER): Payer: BLUE CROSS/BLUE SHIELD | Admitting: Psychology

## 2014-06-01 DIAGNOSIS — F4310 Post-traumatic stress disorder, unspecified: Secondary | ICD-10-CM | POA: Diagnosis not present

## 2014-06-02 ENCOUNTER — Ambulatory Visit: Payer: BLUE CROSS/BLUE SHIELD | Attending: Psychology | Admitting: Physical Therapy

## 2014-06-02 DIAGNOSIS — R269 Unspecified abnormalities of gait and mobility: Secondary | ICD-10-CM | POA: Insufficient documentation

## 2014-06-02 DIAGNOSIS — M6281 Muscle weakness (generalized): Secondary | ICD-10-CM

## 2014-06-02 NOTE — Patient Instructions (Addendum)
KNEE: Extension, Short Arc Quads - Supine   Place bolster under knees. Raise one leg until knee is straight. _10__ reps per set, __2_ sets per, _1-2__ days per week Make sure to hold for 3 seconds in knee extension.  Make sure to pay attention to not let the leg rotate in at the end of the knee extension movement.  Copyright  VHI. All rights reserved.   Bridging over therapy ball, 2 sets of 10 reps

## 2014-06-02 NOTE — Therapy (Signed)
[facility] [address] [place], [place], [address] Phone: [phone]   Fax:  [phone]  Physical Therapy Treatment  Patient Details  Name: Jesse Bowers MRN: [id number] Date of Birth: [birth year] Referring Provider:  Concepcion Elk, MD  Encounter Date: 06/02/2014      PT End of Session - 06/02/14 1052    Visit Number 6   Number of Visits 17   Date for PT Re-Evaluation 07/09/14   PT Start Time 0852   PT Stop Time 0932   PT Time Calculation (min) 40 min   Equipment Utilized During Treatment Gait belt   Activity Tolerance Patient tolerated treatment well   Behavior During Therapy [facility] for tasks assessed/performed      Past Medical History  Diagnosis Date  . psoriatic arthritis   . H/O protein C deficiency   . Hx-TIA (transient ischemic attack)   . Clotting disorder   . Celiac disease   . H/O parotitis     right   . Polycythemia, secondary   . Transgendered   . Sleep apnea   . Neck pain   . Abnormal weight loss   . Gluten enteropathy   . Syrinx of spinal cord 01/06/2014 on MRI    c spine  . Anxiety     PTSD per patient    Past Surgical History  Procedure Laterality Date  . Thyroidectomy, partial  2008  . Cholecystectomy    . Ankle arthroscopy with reconstruction Right 2007  . Knee surgery Bilateral 1984    Right ACL, left PCL repair  . Liver biopsy  2013    normal results.  . Hip arthroscopy w/ labral repair Right 05/11/2013    There were no vitals filed for this visit.  Visit Diagnosis:  Abnormality of gait  Muscle weakness      Subjective Assessment - 06/02/14 0853    Symptoms Went to orthotist and he adjusted toe off brace and adjusted insert/orthotic to assist with decreasing pronation of R foot.     Currently in Pain? Yes   Pain Score 5    Pain Location Knee  R ankle   Pain Orientation Left;Right   Pain Descriptors / Indicators Aching   Pain Frequency Constant   Aggravating Factors   new walking pattern   Pain Relieving Factors nothing really alleviates                       [facility] Adult PT Treatment/Exercise - 06/02/14 0856    Ambulation/Gait   Ambulation/Gait Yes   Ambulation/Gait Assistance 4: Min guard;5: Supervision   Ambulation Distance (Feet) 100 Feet  x2   Assistive device Straight cane   Gait Pattern Step-through pattern;Decreased weight shift to left  tendency for R hip internal rotation   Ambulation Surface Level;Indoor  Treadmill-see below   Gait Comments Treadmill gait 1.2 mph x 7 minutes with bilateral UE support with cues for increased step length, increased foot clearance, equal step length, equal step length   Knee/Hip Exercises: Supine   Short Arc Quad Sets AROM;10 reps;2 sets  RLE cues for 3 sec hold; LLE 10 sec   Short Arc Quad Sets Limitations Pt tends to have increased hip internal rotation, at end range of SAQ, had once incidence of R knee popping with SAQ  Decr. internal rotation with cues for slow, hold of ex   Bridges Strengthening;Right;Left;10 reps;2 sets  lower extremities over red therapy ball   Other Supine Knee Exercises  knees over red therapy ball 2 sets x 10 reps of hip/knee flexion   Knee/Hip Exercises: Sidelying   Hip ABduction AROM;Strengthening;15 reps;Right;Left                PT Education - 06/02/14 1051    Education provided Yes   Education Details HEP-SAQ (with paying attention to neutral lower extremity position to avoid internal rotation at end range); bridging over red therapy ball   Person(s) Educated Patient   Methods Explanation;Demonstration;Handout   Comprehension Verbalized understanding;Returned demonstration          PT Short Term Goals - 05/10/14 1330    PT SHORT TERM GOAL #1   Title Pt will be independent with HEP for improved strength, balance, and gait.   Time 4   Period Weeks   Status New   PT SHORT TERM GOAL #2   Title Pt will improve Timed Up and Go score to less than  or equal to 13.5 seconds for decreased fall risk.   Time 4   Period Weeks   Status New   PT SHORT TERM GOAL #3   Title Pt will improve Dynamic Gait Index score to at least 13/24 for decreased fall risk.   Time 4   Period Weeks   Status New   PT SHORT TERM GOAL #4   Title Pt will negotiate at least 4 steps with step-to pattern, modified independently for improved safety with stair negotiation.   Time 4   Period Weeks   Status New   PT SHORT TERM GOAL #5   Title Pt will verbalize understanding of CVA education-warning signs and risk factors.   Time 4   Period Weeks   Status New           PT Long Term Goals - 05/10/14 1333    PT LONG TERM GOAL #1   Title Pt will verbalize understanding of fall prevention within home environment.   Time 8   Period Weeks   Status New   PT LONG TERM GOAL #2   Title Pt will perform sit<>stand at least 8 of 10 reps with minimal to no UE support, for improved transfer efficiency and safety.   Time 8   Period Weeks   Status New   PT LONG TERM GOAL #3   Title Pt will improve Dynamic Gait Index score to at least 19/24 for decreased fall risk.   Time 8   Period Weeks   Status New   PT LONG TERM GOAL #4   Title Pt will improve gait velocity to at least 2.62 ft/sec for improved gait efficiency and safety.   Time 8   Period Weeks   Status New   PT LONG TERM GOAL #5   Title Pt will verbalize plans for continued community fitness upon D/C from PT.   Time 8   Period Weeks   Status New               Plan - 06/02/14 1052    Clinical Impression Statement Pt continues to need cues for awareness of RLE positioning to avoid excessive internal rotation, which is particularly noticeable at end range of SAQ exercise.  Pt is able to improve iwth slowed pace of exercise.  The excessive internal rotation about hip and RLE may be torqueing R knee and contributing to pain.   Pt will benefit from skilled therapeutic intervention in order to improve on  the following deficits Abnormal gait;Decreased balance;Decreased mobility;Decreased knowledge of use  of DME;Decreased endurance;Decreased safety awareness;Decreased strength;Difficulty walking;Pain   Rehab Potential Good   PT Frequency 2x / week   PT Duration 8 weeks  wk 3 of 8   PT Treatment/Interventions ADLs/Self Care Home Management;Gait training;Stair training;Functional mobility training;DME Instruction;Therapeutic activities;Therapeutic exercise;Balance training;Neuromuscular re-education;Patient/family education   PT Next Visit Plan Check goals next week; continue strengthening and gait training-treadmill and cane   Consulted and Agree with Plan of Care Patient        Problem List Patient Active Problem List   Diagnosis Date Noted  . Syringomyelia 04/10/2014  . Abnormal finding on MRI of brain 04/10/2014  . Chronic pain syndrome 04/10/2014  . Chronic fatigue 04/10/2014  . Numbness 04/10/2014  . OSA (obstructive sleep apnea) 04/10/2014  . Insomnia 04/10/2014  . Protein C deficiency 03/23/2014  . 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 Biceps tendonitis on left 03/01/2014  . Polycythemia, secondary 12/27/2013  . H/O TIA (transient ischemic attack) and stroke 12/27/2013  . Psoriatic arthritis 12/27/2013  . History of celiac disease 12/27/2013  . Weight loss, unintentional 12/27/2013  . H/O protein C deficiency 12/27/2013  . Transgendered 12/27/2013  . Sleep apnea 12/27/2013  . H/O parotitis 12/27/2013  . Neck pain 12/27/2013  . Depression with anxiety 12/27/2013  . DVT (deep venous thrombosis) 12/27/2013    Kern Gingras W. 06/02/2014, 10:55 AM  Mady Haagensen, PT 06/02/2014 10:57 AM Phone: [phone] Fax: [facility] [address] [place], [place], [address] Phone: [phone]   Fax:  [phone]

## 2014-06-06 ENCOUNTER — Ambulatory Visit (INDEPENDENT_AMBULATORY_CARE_PROVIDER_SITE_OTHER): Payer: BLUE CROSS/BLUE SHIELD | Admitting: Psychology

## 2014-06-06 DIAGNOSIS — F4310 Post-traumatic stress disorder, unspecified: Secondary | ICD-10-CM

## 2014-06-07 ENCOUNTER — Encounter: Payer: Self-pay | Admitting: Hematology

## 2014-06-07 ENCOUNTER — Other Ambulatory Visit (HOSPITAL_BASED_OUTPATIENT_CLINIC_OR_DEPARTMENT_OTHER): Payer: BLUE CROSS/BLUE SHIELD

## 2014-06-07 ENCOUNTER — Ambulatory Visit: Payer: BLUE CROSS/BLUE SHIELD

## 2014-06-07 ENCOUNTER — Other Ambulatory Visit: Payer: Self-pay | Admitting: *Deleted

## 2014-06-07 DIAGNOSIS — D751 Secondary polycythemia: Secondary | ICD-10-CM

## 2014-06-07 DIAGNOSIS — M6281 Muscle weakness (generalized): Secondary | ICD-10-CM | POA: Diagnosis not present

## 2014-06-07 DIAGNOSIS — I82409 Acute embolism and thrombosis of unspecified deep veins of unspecified lower extremity: Secondary | ICD-10-CM

## 2014-06-07 DIAGNOSIS — R269 Unspecified abnormalities of gait and mobility: Secondary | ICD-10-CM

## 2014-06-07 DIAGNOSIS — Z862 Personal history of diseases of the blood and blood-forming organs and certain disorders involving the immune mechanism: Secondary | ICD-10-CM

## 2014-06-07 LAB — CBC WITH DIFFERENTIAL/PLATELET
BASO%: 0.2 % (ref 0.0–2.0)
BASOS ABS: 0 10*3/uL (ref 0.0–0.1)
EOS%: 0 % (ref 0.0–7.0)
Eosinophils Absolute: 0 10*3/uL (ref 0.0–0.5)
HCT: 42.5 % (ref 38.4–49.9)
HGB: 13.6 g/dL (ref 13.0–17.1)
LYMPH%: 29.4 % (ref 14.0–49.0)
MCH: 27.2 pg (ref 27.2–33.4)
MCHC: 32 g/dL (ref 32.0–36.0)
MCV: 85 fL (ref 79.3–98.0)
MONO#: 0.5 10*3/uL (ref 0.1–0.9)
MONO%: 12.5 % (ref 0.0–14.0)
NEUT%: 57.9 % (ref 39.0–75.0)
NEUTROS ABS: 2.1 10*3/uL (ref 1.5–6.5)
Platelets: 171 10*3/uL (ref 140–400)
RBC: 5.01 10*6/uL (ref 4.20–5.82)
RDW: 12.7 % (ref 11.0–14.6)
WBC: 3.7 10*3/uL — AB (ref 4.0–10.3)
lymph#: 1.1 10*3/uL (ref 0.9–3.3)

## 2014-06-07 MED ORDER — RIVAROXABAN 20 MG PO TABS: 20.0000 mg | ORAL_TABLET | Freq: Every day | ORAL | Status: DC

## 2014-06-07 NOTE — Patient Instructions (Signed)
Ischemic Stroke Blood carries oxygen to all areas of your body. A stroke happens when your blood does not flow to your brain like normal. If this happens, your brain will not get the oxygen it needs and brain tissue will die. This is an emergency. Problems (symptoms) of a stroke usually happen suddenly. You may notice them when you wake up. They can include:  Loss of feeling or weakness on one side of the body (face, arm, leg).  Feeling confused.  Trouble talking or understanding.  Trouble seeing.  Trouble walking.  Feeling dizzy.  Loss of balance or coordination.  Severe headache without a cause.  Trouble reading or writing. Get help as soon as any of these problems first start. This is important.  RISK FACTORS  Risk factors are things that make it more likely for you to have a stroke. These things include:  High blood pressure (hypertension).  High cholesterol.  Diabetes.  Heart disease.  Having a buildup of fatty deposits in the blood vessels.  Having an abnormal heart rhythm (atrial fibrillation).  Being very overweight (obese).  Smoking.  Taking birth control pills, especially if you smoke.  **Not being active.  **Having a diet high in fats, salt, and calories.  Drinking too much alcohol.  Using illegal drugs.  Being African American.  Being over the age of 62.  Having a family history of stroke.  Having a history of blood clots, stroke, warning stroke (transient ischemic attack, TIA), or heart attack.  Sickle cell disease. HOME CARE  Take all medicines exactly as told by your doctor. Understand all your medicine instructions.  You may need to take a medicine to thin your blood, like aspirin or warfarin. Take warfarin exactly as told.  Taking too much or too little warfarin is dangerous. Get regular blood tests as told, including the PT and INR tests. The test results help your doctor adjust your dose of warfarin. Your PT and INR levels must be  done as often as told by your doctor.  Food can cause problems with warfarin and affect the results of your blood tests. This is true for foods high in vitamin K, such as spinach, kale, broccoli, cabbage, collard and turnip greens, Brussels sprouts, peas, cauliflower, seaweed, and parsley, as well as beef and pork liver, green tea, and soybean oil. Eat the same amount of food high in vitamin K. Avoid major changes in your diet. Tell your doctor before changing your diet. Talk to a food specialist (dietitian) if you have questions.  Many medicines can cause problems with warfarin and affect your PT and INR test results. Tell your doctor about all medicines you take. This includes vitamins and dietary pills (supplements). Be careful with aspirin and medicines that relieve redness, soreness, and puffiness (inflammation). Do not take or stop medicines unless your doctor tells you to.  Warfarin can cause a lot of bruising or bleeding. Hold pressure over cuts for longer than normal. Talk to your doctor about other side effects of warfarin.  Avoid sports or activities that may cause injury or bleeding.  Be careful when you shave, floss your teeth, or use sharp objects.  Avoid alcoholic drinks or drink very little alcohol while taking warfarin. Tell your doctor if you change how much alcohol you drink.  Tell your dentist and other doctors that you take warfarin before procedures.  If you are able to swallow, eat healthy foods. Eat 5 or more servings of fruits and vegetables a day. Eat soft  foods, pureed foods, or eat small bites of food so you do not choke.  Follow your diet program as told, if you are given one.  Keep a healthy weight.  Stay active. Try to get at least 30 minutes of activity on most or all days.  Do not smoke.  Limit how much alcohol you drink even if you are not taking warfarin. Moderate alcohol use is:  No more than 2 drinks each day for men.  No more than 1 drink each day  for women who are not pregnant.  Keep your home safe so you do not fall. Try:  Putting grab bars in the bedroom and bathroom.  Raising toilet seats.  Putting a seat in the shower.  Go to therapy sessions (physical, occupational, and speech) as told by your doctor.  Use a walker or cane at all times if told to do so.  Keep all doctor visits as told. GET HELP IF:  Your personality changes.  You have trouble swallowing.  You are seeing two of everything.  You are dizzy.  You have a fever.  Your skin starts to break down. GET HELP RIGHT AWAY IF:  The symptoms below may be a sign of an emergency. Do not wait to see if the symptoms go away. Call for help (911 in [place] Do not drive yourself to the hospital.  You have sudden weakness or numbness on the face, arm, or leg (especially on one side of the body).  You have sudden trouble walking or moving your arms or legs.  You have sudden confusion.  You have trouble talking or understanding.  You have sudden trouble seeing in one or both eyes.  You lose your balance or your movements are not smooth.  You have a sudden, severe headache with no known cause.  You have new chest pain or you feel your heart beating in an unsteady way.  You are partly or totally unaware of what is going on around you. Document Released: 02/06/2011 Document Revised: 07/04/2013 Document Reviewed: 09/28/2011 [facility] Patient Information 2015 [place], [place]. This information is not intended to replace advice given to you by your health care provider. Make sure you discuss any questions you have with your health care provider.

## 2014-06-07 NOTE — Therapy (Addendum)
[facility] [address] [place], [place], [address] Phone: [phone]   Fax:  [phone]  Physical Therapy Treatment  Patient Details  Name: Jesse Bowers MRN: [id number] Date of Birth: 1965-03-28 Referring Provider:  Concepcion Elk, MD  Encounter Date: 06/07/2014      PT End of Session - 06/07/14 1700    Visit Number 7   Number of Visits 17   Date for PT Re-Evaluation 07/09/14   PT Start Time 0938   PT Stop Time 1448   PT Time Calculation (min) 41 min      Past Medical History  Diagnosis Date  . psoriatic arthritis   . H/O protein C deficiency   . Hx-TIA (transient ischemic attack)   . Clotting disorder   . Celiac disease   . H/O parotitis     right   . Polycythemia, secondary   . Transgendered   . Sleep apnea   . Neck pain   . Abnormal weight loss   . Gluten enteropathy   . Syrinx of spinal cord 01/06/2014 on MRI    c spine  . Anxiety     PTSD per patient    Past Surgical History  Procedure Laterality Date  . Thyroidectomy, partial  2008  . Cholecystectomy    . Ankle arthroscopy with reconstruction Right 2007  . Knee surgery Bilateral 1984    Right ACL, left PCL repair  . Liver biopsy  2013    normal results.  . Hip arthroscopy w/ labral repair Right 05/11/2013    There were no vitals filed for this visit.  Visit Diagnosis:  Abnormality of gait  Muscle weakness      Subjective Assessment - 06/07/14 1410    Subjective Pt reports that his AFO is work ing better for him since having it adjusted. Also reports he will be having a L knee replacement on 07/03/14   Currently in Pain? Yes   Pain Score 5    Pain Location Knee  and right ankle   Pain Orientation Right;Left   Pain Descriptors / Indicators Aching   Pain Type Chronic pain     Performed 3x30 seconds supine hip abductor stretch (butterfly stretch)  Neuro Re-ed: RLE single limb stance activities in parallel bars with BUE support  and single UE support- LLE foot taps on 8" box forward and lateral, repeated step ups with RLE onto 8" box, single limb minisquats.  Also performed forward backward rocking with emphasis on control of LLE--maintaining knee extension.  Checked therapy goals: TUG DGI Stairs-pt able to negotiate stairs with step-to pattern and bilateral hand rails and MOD I; however when attempting with step over step pattern with bilateral hand rails, requires close supervision due to unsteadiness and RLE weakness *See notes on goals and flowsheet for details   Self care: Reviewed CVA warning signs and risk factors                 [facility] Adult PT Treatment/Exercise - 06/07/14 0001    Standardized Balance Assessment   Standardized Balance Assessment Dynamic Gait Index;Timed Up and Go Test   Dynamic Gait Index   Level Surface Mild Impairment   Change in Gait Speed Mild Impairment   Gait with Horizontal Head Turns Normal   Gait with Vertical Head Turns Mild Impairment   Gait and Pivot Turn Normal   Step Over Obstacle Mild Impairment   Step Around Obstacles Mild Impairment   Steps Mild Impairment   Total  Score 18   Timed Up and Go Test   TUG Normal TUG   Normal TUG (seconds) 16.67                PT Education - 06/07/14 1700    Education provided Yes   Education Details CVA education   Person(s) Educated Patient   Methods Explanation;Handout   Comprehension Verbalized understanding          PT Short Term Goals - 06/07/14 1411    PT SHORT TERM GOAL #1   Title Pt will be independent with HEP for improved strength, balance, and gait.   Time 4   Period Weeks   Status Achieved   PT SHORT TERM GOAL #2   Title Pt will improve Timed Up and Go score to less than or equal to 13.5 seconds for decreased fall risk.   Time 4   Period Weeks   Status Not Met  16.67 seconds on 06/07/14   PT SHORT TERM GOAL #3   Title Pt will improve Dynamic Gait Index score to at least 13/24 for  decreased fall risk.   Time 4   Period Weeks   Status Achieved  Scored 18/24 on 06/07/14   PT SHORT TERM GOAL #4   Title Pt will negotiate at least 4 steps with step-to pattern, modified independently for improved safety with stair negotiation.   Time 4   Period Weeks   Status Achieved   PT SHORT TERM GOAL #5   Title Pt will verbalize understanding of CVA education-warning signs and risk factors.   Time 4   Period Weeks   Status Achieved           PT Long Term Goals - 05/10/14 1333    PT LONG TERM GOAL #1   Title Pt will verbalize understanding of fall prevention within home environment.   Time 8   Period Weeks   Status New   PT LONG TERM GOAL #2   Title Pt will perform sit<>stand at least 8 of 10 reps with minimal to no UE support, for improved transfer efficiency and safety.   Time 8   Period Weeks   Status New   PT LONG TERM GOAL #3   Title Pt will improve Dynamic Gait Index score to at least 19/24 for decreased fall risk.   Time 8   Period Weeks   Status New   PT LONG TERM GOAL #4   Title Pt will improve gait velocity to at least 2.62 ft/sec for improved gait efficiency and safety.   Time 8   Period Weeks   Status New   PT LONG TERM GOAL #5   Title Pt will verbalize plans for continued community fitness upon D/C from PT.   Time 8   Period Weeks   Status New               Plan - 06/07/14 1702    Clinical Impression Statement Pt achieved 4/5 PT short term goals today. He demonstrates excessive R knee flexion in stance phase of gait and has increased difficulty with concentric RLE quads contraction to lead on the stairs.  Pt tolerated hip abductor stretch well today.  Continue per plan of care.   PT Next Visit Plan continue gait training, add single limb stance activities to HEP        Problem List Patient Active Problem List   Diagnosis Date Noted  . Syringomyelia 04/10/2014  . Abnormal finding on MRI of  brain 04/10/2014  . Chronic pain syndrome  04/10/2014  . Chronic fatigue 04/10/2014  . Numbness 04/10/2014  . OSA (obstructive sleep apnea) 04/10/2014  . Insomnia 04/10/2014  . Protein C deficiency 03/23/2014  . 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 Biceps tendonitis on left 03/01/2014  . Polycythemia, secondary 12/27/2013  . H/O TIA (transient ischemic attack) and stroke 12/27/2013  . Psoriatic arthritis 12/27/2013  . History of celiac disease 12/27/2013  . Weight loss, unintentional 12/27/2013  . H/O protein C deficiency 12/27/2013  . Transgendered 12/27/2013  . Sleep apnea 12/27/2013  . H/O parotitis 12/27/2013  . Neck pain 12/27/2013  . Depression with anxiety 12/27/2013  . DVT (deep venous thrombosis) 12/27/2013   Delrae Sawyers, PT,DPT,NCS 06/07/2014 5:22 PM Phone [phone] FAX [phone]         [facility] [address] [place], [place], [address] Phone: [phone]   Fax:  [phone]

## 2014-06-08 ENCOUNTER — Other Ambulatory Visit: Payer: Self-pay | Admitting: *Deleted

## 2014-06-08 ENCOUNTER — Telehealth: Payer: Self-pay | Admitting: Hematology

## 2014-06-08 ENCOUNTER — Ambulatory Visit (INDEPENDENT_AMBULATORY_CARE_PROVIDER_SITE_OTHER): Payer: BLUE CROSS/BLUE SHIELD | Admitting: Psychology

## 2014-06-08 DIAGNOSIS — F4310 Post-traumatic stress disorder, unspecified: Secondary | ICD-10-CM

## 2014-06-08 NOTE — Telephone Encounter (Signed)
Patient called and rescheduled his appointments due to knee surguery

## 2014-06-09 ENCOUNTER — Ambulatory Visit: Payer: BLUE CROSS/BLUE SHIELD

## 2014-06-09 ENCOUNTER — Telehealth: Payer: Self-pay | Admitting: *Deleted

## 2014-06-09 DIAGNOSIS — M6281 Muscle weakness (generalized): Secondary | ICD-10-CM

## 2014-06-09 DIAGNOSIS — R269 Unspecified abnormalities of gait and mobility: Secondary | ICD-10-CM

## 2014-06-09 NOTE — Patient Instructions (Signed)
Stair exercises *for all exercises hold rail as needed, eventually progress to letting go of rail  Left leg step ups- Place left leg on the first step and lift your body onto the step without pulling with your arm. Perform 20x.  R leg forward taps: Stand on left leg, tap the right leg on the first, second, and if appropriate, third step then back down to tap the ground. Repeat 10-15x  R leg side taps: perform the same as the forward taps except with the stairs to your right leg.  R leg crossover taps: perform the same as the forward taps except with the stairs next to the left leg, so the right leg  Has to cross over the left to tap the stairs.

## 2014-06-09 NOTE — Therapy (Signed)
[facility] [address] [place], [place], [address] Phone: [phone]   Fax:  [phone]  Physical Therapy Treatment  Patient Details  Name: Jesse Bowers MRN: [id number] Date of Birth: 1965-12-19 Referring Provider:  Concepcion Elk, MD  Encounter Date: 06/09/2014      PT End of Session - 06/09/14 1209    Visit Number 8   Number of Visits 17   Date for PT Re-Evaluation 07/09/14   PT Start Time 1884  Late start due to therapist mistake   PT Stop Time 1020   PT Time Calculation (min) 38 min      Past Medical History  Diagnosis Date  . psoriatic arthritis   . H/O protein C deficiency   . Hx-TIA (transient ischemic attack)   . Clotting disorder   . Celiac disease   . H/O parotitis     right   . Polycythemia, secondary   . Transgendered   . Sleep apnea   . Neck pain   . Abnormal weight loss   . Gluten enteropathy   . Syrinx of spinal cord 01/06/2014 on MRI    c spine  . Anxiety     PTSD per patient    Past Surgical History  Procedure Laterality Date  . Thyroidectomy, partial  2008  . Cholecystectomy    . Ankle arthroscopy with reconstruction Right 2007  . Knee surgery Bilateral 1984    Right ACL, left PCL repair  . Liver biopsy  2013    normal results.  . Hip arthroscopy w/ labral repair Right 05/11/2013    There were no vitals filed for this visit.  Visit Diagnosis:  Abnormality of gait  Muscle weakness      Subjective Assessment - 06/09/14 1208    Subjective Pt is doing well today.   Currently in Pain? No/denies      Neuro re-ed LLE single limb stance exercises--see pt instructions as these were also given as HEP Also performed 3 point star drill 20x in LLE single limb stance, then with LLE in single limb stance on airex mat with intermittent UE support   Gait training :  Rocking with staggered feet 20x with each leg leading without UE support with emphasis on pre-swing and midstance  stability   Then progressed to ambulating in parallel bars without UE support with mirror feedback to maintain pelvis in neutral position among the transferse plane.  Further progressed to gait training outside of bars with emphasis on controlled LLE preswing and toe off and stable pelvis. Pt required MOD A to stabilize pelvis without cane or mirror for visual cue.                       PT Education - 06/09/14 1216    Education provided Yes   Education Details HEP-single limb stance activities   Person(s) Educated Patient   Methods Explanation;Demonstration;Handout   Comprehension Verbalized understanding          PT Short Term Goals - 06/07/14 1411    PT SHORT TERM GOAL #1   Title Pt will be independent with HEP for improved strength, balance, and gait.   Time 4   Period Weeks   Status Achieved   PT SHORT TERM GOAL #2   Title Pt will improve Timed Up and Go score to less than or equal to 13.5 seconds for decreased fall risk.   Time 4   Period Weeks   Status Not  Met  16.67 seconds on 06/07/14   PT SHORT TERM GOAL #3   Title Pt will improve Dynamic Gait Index score to at least 13/24 for decreased fall risk.   Time 4   Period Weeks   Status Achieved  Scored 18/24 on 06/07/14   PT SHORT TERM GOAL #4   Title Pt will negotiate at least 4 steps with step-to pattern, modified independently for improved safety with stair negotiation.   Time 4   Period Weeks   Status Achieved   PT SHORT TERM GOAL #5   Title Pt will verbalize understanding of CVA education-warning signs and risk factors.   Time 4   Period Weeks   Status Achieved           PT Long Term Goals - 05/10/14 1333    PT LONG TERM GOAL #1   Title Pt will verbalize understanding of fall prevention within home environment.   Time 8   Period Weeks   Status New   PT LONG TERM GOAL #2   Title Pt will perform sit<>stand at least 8 of 10 reps with minimal to no UE support, for improved transfer  efficiency and safety.   Time 8   Period Weeks   Status New   PT LONG TERM GOAL #3   Title Pt will improve Dynamic Gait Index score to at least 19/24 for decreased fall risk.   Time 8   Period Weeks   Status New   PT LONG TERM GOAL #4   Title Pt will improve gait velocity to at least 2.62 ft/sec for improved gait efficiency and safety.   Time 8   Period Weeks   Status New   PT LONG TERM GOAL #5   Title Pt will verbalize plans for continued community fitness upon D/C from PT.   Time 8   Period Weeks   Status New               Plan - 06/09/14 1210    Clinical Impression Statement Emphasis of session today was improved LLE single limb stance and hip/pelvic control. Pt's pelvis tends to rotate excessive among the transverse plane leading to increased LLE hip internal rotation in preswing. Pt able to [facility] correct this excessive pelvic rotation with visual cues.   PT Next Visit Plan continue gait training and hip and pelvis stabilization exercises        Problem List Patient Active Problem List   Diagnosis Date Noted  . Syringomyelia 04/10/2014  . Abnormal finding on MRI of brain 04/10/2014  . Chronic pain syndrome 04/10/2014  . Chronic fatigue 04/10/2014  . Numbness 04/10/2014  . OSA (obstructive sleep apnea) 04/10/2014  . Insomnia 04/10/2014  . Protein C deficiency 03/23/2014  . 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 Biceps tendonitis on left 03/01/2014  . Polycythemia, secondary 12/27/2013  . H/O TIA (transient ischemic attack) and stroke 12/27/2013  . Psoriatic arthritis 12/27/2013  . History of celiac disease 12/27/2013  . Weight loss, unintentional 12/27/2013  . H/O protein C deficiency 12/27/2013  . Transgendered 12/27/2013  . Sleep apnea 12/27/2013  . H/O parotitis 12/27/2013  . Neck pain 12/27/2013  . Depression with anxiety 12/27/2013  . DVT (deep venous thrombosis) 12/27/2013    Delrae Sawyers, PT,DPT,NCS 06/09/2014 12:17 PM Phone  [phone] FAX [phone]         [facility] [address] [place], [place], [address] Phone: [phone]   Fax:  [phone]

## 2014-06-09 NOTE — Telephone Encounter (Signed)
Faxed hematology authorization stating pt stable for surgery-Left total Knee replacement to Dr. Len Childs @ [phone].

## 2014-06-12 ENCOUNTER — Other Ambulatory Visit: Payer: Self-pay | Admitting: Registered Nurse

## 2014-06-12 ENCOUNTER — Encounter: Payer: Self-pay | Admitting: Hematology

## 2014-06-12 ENCOUNTER — Ambulatory Visit: Payer: Self-pay | Admitting: Registered Nurse

## 2014-06-12 ENCOUNTER — Encounter: Payer: Self-pay | Admitting: Registered Nurse

## 2014-06-12 ENCOUNTER — Encounter: Payer: BLUE CROSS/BLUE SHIELD | Attending: Physical Medicine & Rehabilitation | Admitting: Registered Nurse

## 2014-06-12 VITALS — BP 122/80 | HR 88 | Resp 14

## 2014-06-12 DIAGNOSIS — Z9181 History of falling: Secondary | ICD-10-CM | POA: Insufficient documentation

## 2014-06-12 DIAGNOSIS — I69320 Aphasia following cerebral infarction: Secondary | ICD-10-CM | POA: Diagnosis not present

## 2014-06-12 DIAGNOSIS — D6859 Other primary thrombophilia: Secondary | ICD-10-CM | POA: Insufficient documentation

## 2014-06-12 DIAGNOSIS — G8929 Other chronic pain: Secondary | ICD-10-CM | POA: Insufficient documentation

## 2014-06-12 DIAGNOSIS — K900 Celiac disease: Secondary | ICD-10-CM | POA: Diagnosis not present

## 2014-06-12 DIAGNOSIS — D751 Secondary polycythemia: Secondary | ICD-10-CM | POA: Diagnosis not present

## 2014-06-12 DIAGNOSIS — M7522 Bicipital tendinitis, left shoulder: Secondary | ICD-10-CM | POA: Diagnosis not present

## 2014-06-12 DIAGNOSIS — R209 Unspecified disturbances of skin sensation: Secondary | ICD-10-CM | POA: Insufficient documentation

## 2014-06-12 DIAGNOSIS — F418 Other specified anxiety disorders: Secondary | ICD-10-CM | POA: Diagnosis not present

## 2014-06-12 DIAGNOSIS — L4050 Arthropathic psoriasis, unspecified: Secondary | ICD-10-CM | POA: Insufficient documentation

## 2014-06-12 DIAGNOSIS — Z5181 Encounter for therapeutic drug level monitoring: Secondary | ICD-10-CM | POA: Diagnosis not present

## 2014-06-12 DIAGNOSIS — Z87890 Personal history of sex reassignment: Secondary | ICD-10-CM | POA: Insufficient documentation

## 2014-06-12 DIAGNOSIS — G894 Chronic pain syndrome: Secondary | ICD-10-CM

## 2014-06-12 DIAGNOSIS — I69398 Other sequelae of cerebral infarction: Secondary | ICD-10-CM | POA: Diagnosis not present

## 2014-06-12 DIAGNOSIS — M545 Low back pain: Secondary | ICD-10-CM | POA: Insufficient documentation

## 2014-06-12 DIAGNOSIS — Z86718 Personal history of other venous thrombosis and embolism: Secondary | ICD-10-CM | POA: Diagnosis not present

## 2014-06-12 DIAGNOSIS — Z79891 Long term (current) use of opiate analgesic: Secondary | ICD-10-CM | POA: Insufficient documentation

## 2014-06-12 DIAGNOSIS — Z7901 Long term (current) use of anticoagulants: Secondary | ICD-10-CM | POA: Insufficient documentation

## 2014-06-12 DIAGNOSIS — I69351 Hemiplegia and hemiparesis following cerebral infarction affecting right dominant side: Secondary | ICD-10-CM | POA: Diagnosis not present

## 2014-06-12 DIAGNOSIS — G8100 Flaccid hemiplegia affecting unspecified side: Secondary | ICD-10-CM

## 2014-06-12 DIAGNOSIS — M13862 Other specified arthritis, left knee: Secondary | ICD-10-CM | POA: Diagnosis not present

## 2014-06-12 DIAGNOSIS — S43002A Unspecified subluxation of left shoulder joint, initial encounter: Secondary | ICD-10-CM | POA: Insufficient documentation

## 2014-06-12 DIAGNOSIS — G8101 Flaccid hemiplegia affecting right dominant side: Secondary | ICD-10-CM

## 2014-06-12 DIAGNOSIS — Z79899 Other long term (current) drug therapy: Secondary | ICD-10-CM | POA: Diagnosis not present

## 2014-06-12 MED ORDER — MORPHINE SULFATE ER 30 MG PO TBCR: 30.0000 mg | EXTENDED_RELEASE_TABLET | Freq: Three times a day (TID) | ORAL | Status: DC

## 2014-06-12 NOTE — Progress Notes (Signed)
Subjective:    Patient ID: Jesse Bowers, male    DOB: 01-10-1966, 49 y.o.   MRN: [id number]  HPI:[name] is a 49 year old male who returns for follow-up and medication refill. He says his pain is located in his right thumb, left shoulder, left knee, and right ankle. He rates his pain 7. His current exercise regime is attending physical therapy at [facility] twice a week. He's schedule for left knee replacement on May 2,2016 with Dr. Charna Bowers.   Pain Inventory Average Pain 4 Pain Right Now 7 My pain is constant, sharp and dull  In the last 24 hours, has pain interfered with the following? General activity 9 Relation with others 9 Enjoyment of life 9 What TIME of day is your pain at its worst? morning and night  Sleep (in general) Poor  Pain is worse with: walking, bending and standing Pain improves with: rest and medication Relief from Meds: 5  Mobility walk without assistance use a cane how many minutes can you walk? 3-5  ability to climb steps?  yes do you drive?  yes  Function disabled: date disabled .  Neuro/Psych weakness trouble walking spasms  Prior Studies Any changes since last visit?  no  Physicians involved in your care Any changes since last visit?  no   Family History  Problem Relation Age of Onset  . Stroke Paternal Uncle     age 38  . Stroke Maternal Grandfather     18  . Hypertension Mother   . Psoriasis Mother   . Stroke Maternal Grandmother   . Congestive Heart Failure Maternal Grandmother   . Cancer Paternal Grandfather   . Protein C deficiency Sister 27    Miscarriages   History   Social History  . Marital Status: Married    Spouse Name: N/A  . Number of Children: 2  . 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 Smoking status: Never Smoker   . Smokeless tobacco: Not on file  . Alcohol Use: Yes   Comment: wine once a month  . Drug Use: No  . Sexual Activity: Yes    Birth Control/ Protection: None     Comment: patient is a transgender on testosterone shots, no biological kids   Other Topics Concern  . None   Social History Narrative   Education 4 year college, former Therapist, sports X 15 years, pediatric nurse practitioner x 6 years, did NP degree from [employer] [place]. Relocated to [place] about 2 months ago from [place], [place]. Patient was in [place] for last 4 years and prior to that in [place]. His wife is working as Scientist, research (physical sciences) for [employer]. Patient is not working and applying for disability. They have 2 kids but no biologic children.    Past Surgical History  Procedure Laterality Date  . Thyroidectomy, partial  2008  . Cholecystectomy    . Ankle arthroscopy with reconstruction Right 2007  . Knee surgery Bilateral 1984    Right ACL, left PCL repair  . Liver biopsy  2013    normal results.  . Hip arthroscopy w/ labral repair Right 05/11/2013   Past Medical History  Diagnosis Date  . psoriatic arthritis   . H/O protein C deficiency   . Hx-TIA (transient ischemic attack)   . Clotting disorder   . Celiac disease   .  H/O parotitis     right   . Polycythemia, secondary   . Transgendered   . Sleep apnea   . Neck pain   . Abnormal weight loss   . Gluten enteropathy   . Syrinx of spinal cord 01/06/2014 on MRI    c spine  . Anxiety     PTSD per patient   BP 122/80 mmHg  Pulse 88  Resp 14  SpO2 99%  Opioid Risk Score:   Fall Risk Score: Low Fall Risk (0-5 points)`1  Depression screen PHQ 2/9 = 11  Depression screen PHQ 2/9 06/12/2014  Decreased Interest 2  Down, Depressed, Hopeless 0  PHQ - 2 Score 2  Altered sleeping 3  Tired, decreased energy 3  Change in appetite 0  Feeling bad or failure about yourself  0  Trouble concentrating 3  Moving slowly or fidgety/restless 0  Suicidal thoughts 0  PHQ-9 Score 11     Review of Systems  HENT: Negative.   Eyes:  Negative.   Respiratory: Negative.   Cardiovascular: Negative.   Gastrointestinal: Negative.   Endocrine: Negative.   Genitourinary: Negative.   Musculoskeletal: Positive for myalgias, back pain and arthralgias.  Skin: Negative.   Allergic/Immunologic: Negative.   Neurological: Positive for weakness.       Trouble walking, spasms  Hematological: Negative.   Psychiatric/Behavioral: Negative.        Objective:   Physical Exam  Constitutional: He is oriented to person, place, and time. He appears well-developed and well-nourished.  HENT:  Head: Normocephalic and atraumatic.  Neck: Normal range of motion. Neck supple.  Cardiovascular: Normal rate and regular rhythm.   Pulmonary/Chest: Effort normal and breath sounds normal.  Musculoskeletal:  Normal Muscle Bulk and Muscle Testing Reveals: Upper Extremities: Full ROM and Muscle Strength on the Right 3/5 and Left 4/5 Thoracic Paraspinal Tenderness: T-7- T-9 Lower Extremities: Full ROM and Muscle Strength 5/5 Bilateral Lower Extremities Flexion Produces Pain into Patella's Arises from chair with ease/ using three prong cane for support Antalgic Gait  Neurological: He is alert and oriented to person, place, and time.  Skin: Skin is warm and dry.  Psychiatric: He has a normal mood and affect.  Nursing note and vitals reviewed.         Assessment & Plan:  1. Psoriatic arthritis with pain in multiple areas, most prominently feet, hands, elbows.: Refilled:MS Contin 30 mg 12 hr tablet one tablet every 8 hours #90. 2. 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 3. Patello-femoral arthritis left knee: Continue Voltaren Gel Schedule for TKR on 07/03/14 4. Chronic mid- low back pain: Continue current medication regime, and encourage to increase activity as tolerated. 5. Polycythemia:  Oncology Following.  6. Depression with anxiety : Continue Wellbutrin and Buspar   20 minutes of face to face patient care time was spent during this visit. All questions were encouraged and answered.   F/U in 1 month

## 2014-06-13 ENCOUNTER — Ambulatory Visit (INDEPENDENT_AMBULATORY_CARE_PROVIDER_SITE_OTHER): Payer: BLUE CROSS/BLUE SHIELD | Admitting: Psychology

## 2014-06-13 ENCOUNTER — Other Ambulatory Visit: Payer: Self-pay | Admitting: Hematology

## 2014-06-13 ENCOUNTER — Telehealth: Payer: Self-pay | Admitting: *Deleted

## 2014-06-13 DIAGNOSIS — F4310 Post-traumatic stress disorder, unspecified: Secondary | ICD-10-CM

## 2014-06-13 LAB — PMP ALCOHOL METABOLITE (ETG): Ethyl Glucuronide (EtG): NEGATIVE ng/mL

## 2014-06-13 NOTE — Telephone Encounter (Signed)
Dr. Burr Medico sent a MyChart message to pt.

## 2014-06-13 NOTE — Telephone Encounter (Signed)
I tried calling Jesse Bowers's home and mobile phone, but did not reach him. Given his prior DVT was provoked, and his polycythemia is better controlled, I do not recommend a IVC placement before his knee surgery. I would encourage him to ambulatory earlier, and restart Xarelto as soon as possible after surgery, hopefully in 1-2 days, if okay with his orthopedic surgeon. Please let Jesse Bowers and his PCP know.   [name]  06/13/2014

## 2014-06-13 NOTE — Telephone Encounter (Signed)
PT.'S PRIMARY CARE PHYSICIAN AND THE ORTHOPEDIC SURGEON ASKED IF DR.FENG RECOMMENDED A TEMPORARY IVC FILTER TO BE PLACED FOR PT.'S KNEE REPLACEMENT SURGERY. THIS NOTE ROUTED TO DR.[place]. A COPY OF MYCHART MESSAGE WAS PLACED ON DR.FENG'S DESK.

## 2014-06-14 ENCOUNTER — Ambulatory Visit: Payer: BLUE CROSS/BLUE SHIELD | Admitting: Physical Therapy

## 2014-06-14 DIAGNOSIS — M6281 Muscle weakness (generalized): Secondary | ICD-10-CM | POA: Diagnosis not present

## 2014-06-14 DIAGNOSIS — R269 Unspecified abnormalities of gait and mobility: Secondary | ICD-10-CM

## 2014-06-14 NOTE — Patient Instructions (Signed)
It is important to avoid accidents which may result in broken bones.  Here are a few ideas on how to make your home safer so you will be less likely to trip or fall.  1. Use nonskid mats or non slip strips in your shower or tub, on your bathroom floor and around sinks.  If you know that you have spilled water, wipe it up! 2. In the bathroom, it is important to have properly installed grab bars on the walls or on the edge of the tub.  Towel racks are NOT strong enough for you to hold onto or to pull on for support. 3. Stairs and hallways should have enough light.  Add lamps or night lights if you need ore light. 4. It is good to have handrails on both sides of the stairs if possible.  Always fix broken handrails right away. 5. It is important to see the edges of steps.  Paint the edges of outdoor steps white so you can see them better.  Put colored tape on the edge of inside steps. 6. Throw-rugs are dangerous because they can slide.  Removing the rugs is the best idea, but if they must stay, add adhesive carpet tape to prevent slipping. 7. Do not keep things on stairs or in the halls.  Remove small furniture that blocks the halls as it may cause you to trip.  Keep telephone and electrical cords out of the way where you walk. 8. Always were sturdy, rubber-soled shoes for good support.  Never wear just socks, especially on the stairs.  Socks may cause you to slip or fall.  Do not wear full-length housecoats as you can easily trip on the bottom.  9. Place the things you use the most on the shelves that are the easiest to reach.  If you use a stepstool, make sure it is in good condition.  If you feel unsteady, DO NOT climb, ask for help. 10. If a health professional advises you to use a cane or walker, do not be ashamed.  These items can keep you from falling and breaking your bones.

## 2014-06-15 ENCOUNTER — Ambulatory Visit (INDEPENDENT_AMBULATORY_CARE_PROVIDER_SITE_OTHER): Payer: BLUE CROSS/BLUE SHIELD | Admitting: Psychology

## 2014-06-15 DIAGNOSIS — F3110 Bipolar disorder, current episode manic without psychotic features, unspecified: Secondary | ICD-10-CM

## 2014-06-15 LAB — OPIATES/OPIOIDS (LC/MS-MS)
Codeine Urine: NEGATIVE ng/mL (ref <50)
HYDROMORPHONE: NEGATIVE ng/mL — AB (ref <50)
Hydrocodone: NEGATIVE ng/mL (ref <50)
MORPHINE: 5483 ng/mL (ref <50)
NORHYDROCODONE, UR: NEGATIVE ng/mL (ref <50)
NOROXYCODONE, UR: NEGATIVE ng/mL (ref <50)
Oxycodone, ur: NEGATIVE ng/mL (ref <50)
Oxymorphone: NEGATIVE ng/mL (ref <50)

## 2014-06-16 ENCOUNTER — Ambulatory Visit: Payer: Self-pay | Admitting: Physical Therapy

## 2014-06-16 LAB — PRESCRIPTION MONITORING PROFILE (SOLSTAS)
AMPHETAMINE/METH: NEGATIVE ng/mL
Barbiturate Screen, Urine: NEGATIVE ng/mL
Benzodiazepine Screen, Urine: NEGATIVE ng/mL
Buprenorphine, Urine: NEGATIVE ng/mL
CANNABINOID SCRN UR: NEGATIVE ng/mL
Carisoprodol, Urine: NEGATIVE ng/mL
Cocaine Metabolites: NEGATIVE ng/mL
Creatinine, Urine: 25.26 mg/dL (ref >20.0)
Fentanyl, Ur: NEGATIVE ng/mL
MDMA URINE: NEGATIVE ng/mL
METHADONE SCREEN, URINE: NEGATIVE ng/mL
Meperidine, Ur: NEGATIVE ng/mL
NITRITES URINE, INITIAL: NEGATIVE ug/mL
OXYCODONE SCRN UR: NEGATIVE ng/mL
PH URINE, INITIAL: 5.4 pH (ref 4.5–8.9)
Propoxyphene: NEGATIVE ng/mL
TAPENTADOLUR: NEGATIVE ng/mL
Tramadol Scrn, Ur: NEGATIVE ng/mL
Zolpidem, Urine: NEGATIVE ng/mL

## 2014-06-16 NOTE — Therapy (Signed)
[facility] [address] [place], [place], [address] Phone: [phone]   Fax:  [phone]  Physical Therapy Treatment  Patient Details  Name: Jesse Bowers MRN: [id number] Date of Birth: 17-Jul-1965 Referring Provider:  Concepcion Elk, MD  Encounter Date: 06/14/2014      PT End of Session - 06/16/14 1240    Visit Number 9   Number of Visits 17   Date for PT Re-Evaluation 07/09/14   PT Start Time 0935   PT Stop Time 1015   PT Time Calculation (min) 40 min   Activity Tolerance Patient tolerated treatment well   Behavior During Therapy [facility] for tasks assessed/performed      Past Medical History  Diagnosis Date  . psoriatic arthritis   . H/O protein C deficiency   . Hx-TIA (transient ischemic attack)   . Clotting disorder   . Celiac disease   . H/O parotitis     right   . Polycythemia, secondary   . Transgendered   . Sleep apnea   . Neck pain   . Abnormal weight loss   . Gluten enteropathy   . Syrinx of spinal cord 01/06/2014 on MRI    c spine  . Anxiety     PTSD per patient    Past Surgical History  Procedure Laterality Date  . Thyroidectomy, partial  2008  . Cholecystectomy    . Ankle arthroscopy with reconstruction Right 2007  . Knee surgery Bilateral 1984    Right ACL, left PCL repair  . Liver biopsy  2013    normal results.  . Hip arthroscopy w/ labral repair Right 05/11/2013    There were no vitals filed for this visit.  Visit Diagnosis:  Abnormality of gait  Muscle weakness      Subjective Assessment - 06/16/14 1238    Subjective Been off embril for 2 weeks; plan for surgery is 07/03/14   Pain Onset 1 to 4 weeks ago       Pt performs single limb stance activities at stairs, with cues for neutral pelvis, to avoid excessive rotation.  Pt performs exercises from HEP given last visit.  Self Care:  Discussed fall prevention, need to continue HEP for strength, stretching, balance.   Discussed importance of awareness of optimal, normal, even, relaxed gait pattern to avoid compensations and pain.  Discussed POC and plans to discharge PT this visit.                          PT Education - 06/16/14 1239    Education provided Yes   Education Details fall prevention, progress towards goals/upcoming surgery/POC; plan to continue HEP until scheduled TKR   Person(s) Educated Patient   Methods Explanation   Comprehension Verbalized understanding          PT Short Term Goals - 06/07/14 1411    PT SHORT TERM GOAL #1   Title Pt will be independent with HEP for improved strength, balance, and gait.   Time 4   Period Weeks   Status Achieved   PT SHORT TERM GOAL #2   Title Pt will improve Timed Up and Go score to less than or equal to 13.5 seconds for decreased fall risk.   Time 4   Period Weeks   Status Not Met  16.67 seconds on 06/07/14   PT SHORT TERM GOAL #3   Title Pt will improve Dynamic Gait Index score to at least 13/24  for decreased fall risk.   Time 4   Period Weeks   Status Achieved  Scored 18/24 on 06/07/14   PT SHORT TERM GOAL #4   Title Pt will negotiate at least 4 steps with step-to pattern, modified independently for improved safety with stair negotiation.   Time 4   Period Weeks   Status Achieved   PT SHORT TERM GOAL #5   Title Pt will verbalize understanding of CVA education-warning signs and risk factors.   Time 4   Period Weeks   Status Achieved           PT Long Term Goals - 06/14/14 2706    PT LONG TERM GOAL #1   Title Pt will verbalize understanding of fall prevention within home environment.   Status Achieved   PT LONG TERM GOAL #2   Title Pt will perform sit<>stand at least 8 of 10 reps with minimal to no UE support, for improved transfer efficiency and safety.   Baseline Requires UE support for sit<>stand   Status Not Met   PT LONG TERM GOAL #3   Title Pt will improve Dynamic Gait Index score to at least 19/24 for  decreased fall risk.   Baseline 18/24 on 06/09/14   Status Not Met   PT LONG TERM GOAL #4   Title Pt will improve gait velocity to at least 2.62 ft/sec for improved gait efficiency and safety.   Baseline 2.2 ft/sec 06/14/14   Status Not Met   PT LONG TERM GOAL #5   Title Pt will verbalize plans for continued community fitness upon D/C from PT.   Status Achieved               Plan - 06/16/14 1241    Clinical Impression Statement Talked today about today being last scheduled appointment; pt reports having upcoming scheduled L TKR on 07/03/14.  Pt has been given HEP to addres strength, stretching and balance.  Overall progress has been limited by pain.  Pt continues to have increased pelvic rotation, and needs cues to relaxed, normalized gait pattern.  Plan to discharge this visit, with pt continuing to perform HEP up until TKR.   Pt will benefit from skilled therapeutic intervention in order to improve on the following deficits Abnormal gait;Decreased balance;Decreased mobility;Decreased knowledge of use of DME;Decreased endurance;Decreased safety awareness;Decreased strength;Difficulty walking;Pain   Rehab Potential Good   PT Next Visit Plan D/C today   Consulted and Agree with Plan of Care Patient        Problem List Patient Active Problem List   Diagnosis Date Noted  . Syringomyelia 04/10/2014  . Abnormal finding on MRI of brain 04/10/2014  . Chronic pain syndrome 04/10/2014  . Chronic fatigue 04/10/2014  . Numbness 04/10/2014  . OSA (obstructive sleep apnea) 04/10/2014  . Insomnia 04/10/2014  . Protein C deficiency 03/23/2014  . 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 Biceps tendonitis on left 03/01/2014  . Polycythemia, secondary 12/27/2013  . H/O TIA (transient ischemic attack) and stroke 12/27/2013  . Psoriatic arthritis 12/27/2013  . History of celiac disease 12/27/2013  . Weight loss, unintentional 12/27/2013  . H/O protein C deficiency 12/27/2013  . Transgendered  12/27/2013  . Sleep apnea 12/27/2013  . H/O parotitis 12/27/2013  . Neck pain 12/27/2013  . Depression with anxiety 12/27/2013  . DVT (deep venous thrombosis) 12/27/2013   PHYSICAL THERAPY DISCHARGE SUMMARY  Visits from Start of Care: 9  Current functional level related to goals / functional outcomes:  PT Long Term Goals - 06/14/14 7473    PT LONG TERM GOAL #1   Title Pt will verbalize understanding of fall prevention within home environment.   Status Achieved   PT LONG TERM GOAL #2   Title Pt will perform sit<>stand at least 8 of 10 reps with minimal to no UE support, for improved transfer efficiency and safety.   Baseline Requires UE support for sit<>stand   Status Not Met   PT LONG TERM GOAL #3   Title Pt will improve Dynamic Gait Index score to at least 19/24 for decreased fall risk.   Baseline 18/24 on 06/09/14   Status Not Met   PT LONG TERM GOAL #4   Title Pt will improve gait velocity to at least 2.62 ft/sec for improved gait efficiency and safety.   Baseline 2.2 ft/sec 06/14/14   Status Not Met   PT LONG TERM GOAL #5   Title Pt will verbalize plans for continued community fitness upon D/C from PT.   Status Achieved       Remaining deficits: Pain, strength, antalgic, compensating gait pattern with excessive pelvic rotation   Education / Equipment: Pt has been educated in HEP, fall prevention, with pt verbalizing understanding.  Plan: Patient agrees to discharge.  Patient goals were not met. Patient is being discharged due to                                                     ?????awaiting upcoming TKR surgery.     Adelis Docter W. 06/16/2014, 12:44 PM  Mady Haagensen, PT 06/16/2014 12:46 PM Phone: [phone] Fax: [facility] [address] [place], [place], [address] Phone: [phone]   Fax:  [phone]

## 2014-06-20 ENCOUNTER — Encounter: Payer: Self-pay | Admitting: Hematology

## 2014-06-20 ENCOUNTER — Ambulatory Visit (INDEPENDENT_AMBULATORY_CARE_PROVIDER_SITE_OTHER): Payer: BLUE CROSS/BLUE SHIELD | Admitting: Psychology

## 2014-06-20 DIAGNOSIS — F4310 Post-traumatic stress disorder, unspecified: Secondary | ICD-10-CM

## 2014-06-21 ENCOUNTER — Encounter: Payer: Self-pay | Admitting: Physical Medicine & Rehabilitation

## 2014-06-21 ENCOUNTER — Other Ambulatory Visit: Payer: Self-pay | Admitting: Hematology

## 2014-06-21 ENCOUNTER — Telehealth: Payer: Self-pay | Admitting: *Deleted

## 2014-06-21 DIAGNOSIS — D751 Secondary polycythemia: Secondary | ICD-10-CM

## 2014-06-21 NOTE — Telephone Encounter (Signed)
Pt saw a psychiatrist today. He was prescribed chlorazepate 3.75 mg TID. He asking if it is okay for him to take this medication without violating his contract

## 2014-06-22 ENCOUNTER — Ambulatory Visit (INDEPENDENT_AMBULATORY_CARE_PROVIDER_SITE_OTHER): Payer: BLUE CROSS/BLUE SHIELD | Admitting: Psychology

## 2014-06-22 ENCOUNTER — Telehealth: Payer: Self-pay | Admitting: Hematology

## 2014-06-22 DIAGNOSIS — F4310 Post-traumatic stress disorder, unspecified: Secondary | ICD-10-CM | POA: Diagnosis not present

## 2014-06-22 NOTE — Telephone Encounter (Signed)
This is an Pharmacist, hospital.  I will add to his drug list

## 2014-06-22 NOTE — Telephone Encounter (Signed)
Left message to confirmed referral appointment for 05/09 @ 9:15

## 2014-06-22 NOTE — Telephone Encounter (Signed)
I have forwarded his email to Dr [name] and added the med to his med list.

## 2014-06-27 ENCOUNTER — Ambulatory Visit (INDEPENDENT_AMBULATORY_CARE_PROVIDER_SITE_OTHER): Payer: BLUE CROSS/BLUE SHIELD | Admitting: Psychology

## 2014-06-27 DIAGNOSIS — F4310 Post-traumatic stress disorder, unspecified: Secondary | ICD-10-CM | POA: Diagnosis not present

## 2014-06-29 ENCOUNTER — Ambulatory Visit (INDEPENDENT_AMBULATORY_CARE_PROVIDER_SITE_OTHER): Payer: BLUE CROSS/BLUE SHIELD | Admitting: Psychology

## 2014-06-29 DIAGNOSIS — F3110 Bipolar disorder, current episode manic without psychotic features, unspecified: Secondary | ICD-10-CM

## 2014-06-30 NOTE — Progress Notes (Signed)
Urine drug screen for this encounter is consistent for prescribed medication 

## 2014-07-06 ENCOUNTER — Ambulatory Visit: Payer: BLUE CROSS/BLUE SHIELD | Admitting: Psychology

## 2014-07-07 ENCOUNTER — Other Ambulatory Visit: Payer: BLUE CROSS/BLUE SHIELD

## 2014-07-07 ENCOUNTER — Encounter: Payer: Self-pay | Admitting: Physical Medicine & Rehabilitation

## 2014-07-07 ENCOUNTER — Ambulatory Visit: Payer: BLUE CROSS/BLUE SHIELD | Admitting: Hematology

## 2014-07-10 ENCOUNTER — Ambulatory Visit: Payer: Self-pay | Admitting: Endocrinology

## 2014-07-10 NOTE — Telephone Encounter (Signed)
i allowed him to take percocet for breakthrough pain and prior to therapies.

## 2014-07-11 ENCOUNTER — Ambulatory Visit: Payer: BLUE CROSS/BLUE SHIELD | Admitting: Psychology

## 2014-07-13 ENCOUNTER — Ambulatory Visit: Payer: BLUE CROSS/BLUE SHIELD | Admitting: Psychology

## 2014-07-13 ENCOUNTER — Other Ambulatory Visit: Payer: BLUE CROSS/BLUE SHIELD

## 2014-07-13 ENCOUNTER — Other Ambulatory Visit: Payer: Self-pay | Admitting: *Deleted

## 2014-07-13 ENCOUNTER — Ambulatory Visit: Payer: BLUE CROSS/BLUE SHIELD | Admitting: Hematology

## 2014-07-14 ENCOUNTER — Ambulatory Visit: Payer: Self-pay | Admitting: Physical Medicine & Rehabilitation

## 2014-07-17 ENCOUNTER — Telehealth: Payer: Self-pay | Admitting: Hematology

## 2014-07-17 ENCOUNTER — Telehealth: Payer: Self-pay | Admitting: *Deleted

## 2014-07-17 ENCOUNTER — Non-Acute Institutional Stay (SKILLED_NURSING_FACILITY): Payer: BLUE CROSS/BLUE SHIELD | Admitting: Adult Health

## 2014-07-17 DIAGNOSIS — I82401 Acute embolism and thrombosis of unspecified deep veins of right lower extremity: Secondary | ICD-10-CM | POA: Diagnosis not present

## 2014-07-17 DIAGNOSIS — M62838 Other muscle spasm: Secondary | ICD-10-CM

## 2014-07-17 DIAGNOSIS — M1712 Unilateral primary osteoarthritis, left knee: Secondary | ICD-10-CM | POA: Diagnosis not present

## 2014-07-17 DIAGNOSIS — K5900 Constipation, unspecified: Secondary | ICD-10-CM

## 2014-07-17 DIAGNOSIS — F4310 Post-traumatic stress disorder, unspecified: Secondary | ICD-10-CM | POA: Diagnosis not present

## 2014-07-17 NOTE — Telephone Encounter (Signed)
Jesse Bowers called with an FYI.  He has had his knee replacement surgery and his meds are MS Contin 30 mg tid, ultram and oxy IR bid.  He just wanted to let us know and he has an appt 07/27/14 with Zella Ball.

## 2014-07-17 NOTE — Telephone Encounter (Signed)
Called and left a message with a new appointment

## 2014-07-18 ENCOUNTER — Non-Acute Institutional Stay (SKILLED_NURSING_FACILITY): Payer: BLUE CROSS/BLUE SHIELD | Admitting: Internal Medicine

## 2014-07-18 ENCOUNTER — Ambulatory Visit: Payer: BLUE CROSS/BLUE SHIELD | Admitting: Psychology

## 2014-07-18 DIAGNOSIS — F4310 Post-traumatic stress disorder, unspecified: Secondary | ICD-10-CM

## 2014-07-18 DIAGNOSIS — I82409 Acute embolism and thrombosis of unspecified deep veins of unspecified lower extremity: Secondary | ICD-10-CM | POA: Diagnosis not present

## 2014-07-18 DIAGNOSIS — M62838 Other muscle spasm: Secondary | ICD-10-CM | POA: Diagnosis not present

## 2014-07-18 DIAGNOSIS — K5901 Slow transit constipation: Secondary | ICD-10-CM | POA: Diagnosis not present

## 2014-07-18 DIAGNOSIS — M1712 Unilateral primary osteoarthritis, left knee: Secondary | ICD-10-CM

## 2014-07-18 NOTE — Progress Notes (Signed)
Patient ID: Jesse Bowers, male   DOB: May 03, 1965, 49 y.o.   MRN: [id number]     [facility]   PCP: Concepcion Elk, MD  Code Status: full code  Allergies  Allergen Reactions  . Penicillin G Anaphylaxis  . Vancomycin Rash  . Duloxetine     Restless legs  . Gabapentin Nausea Only  . Sulfa Antibiotics Rash    Stevens-Johnson rash    Chief Complaint  Patient presents with  . New Admit To SNF     HPI:  49 year old patient is here for short term rehabilitation post hospital admission with left knee OA. He underwent left total knee arthroplasty and was in another SNF for STR. He was transferred from there to this SNF to continue rehabilitation. He is seen in his room with his wife present. He complaints of muscle spasm but denies any other concerns. He mentions taking flexeril 5 mg daily at bedtime at home  Review of Systems:  Constitutional: Negative for fever, chills, diaphoresis.  HENT: Negative for headache, congestion Eyes: Negative for eye pain, blurred vision, double vision and discharge.  Respiratory: Negative for cough, shortness of breath and wheezing.   Cardiovascular: Negative for chest pain, palpitations. Has left leg swelling.  Gastrointestinal: Negative for heartburn, nausea, vomiting, abdominal pain. Had bowel movement this am Genitourinary: Negative for dysuria Musculoskeletal: Negative for back pain, falls. Has hx of psoriatic arthritis and follows with PMR clinic Skin: Negative for itching, rash.  Neurological: Negative for dizziness, tingling, focal weakness Psychiatric/Behavioral: Negative for depression   Past Medical History  Diagnosis Date  . psoriatic arthritis   . H/O protein C deficiency   . Hx-TIA (transient ischemic attack)   . Clotting disorder   . Celiac disease   . H/O parotitis     right   . Polycythemia, secondary   . Transgendered   . Sleep apnea   . Neck pain   . Abnormal weight loss   . Gluten  enteropathy   . Syrinx of spinal cord 01/06/2014 on MRI    c spine  . Anxiety     PTSD per patient   Past Surgical History  Procedure Laterality Date  . Thyroidectomy, partial  2008  . Cholecystectomy    . Ankle arthroscopy with reconstruction Right 2007  . Knee surgery Bilateral 1984    Right ACL, left PCL repair  . Liver biopsy  2013    normal results.  . Hip arthroscopy w/ labral repair Right 05/11/2013   Social History:   reports that he has never smoked. He does not have any smokeless tobacco history on file. He reports that he drinks alcohol. He reports that he does not use illicit drugs.  Family History  Problem Relation Age of Onset  . Stroke Paternal Uncle     age 9  . Stroke Maternal Grandfather     65  . Hypertension Mother   . Psoriasis Mother   . Stroke Maternal Grandmother   . Congestive Heart Failure Maternal Grandmother   . Cancer Paternal Grandfather   . Protein C deficiency Sister 80    Miscarriages    Medications: Patient's Medications  New Prescriptions   No medications on file  Previous Medications   CLORAZEPATE (TRANXENE) 3.75 MG TABLET    Take 1 tablet by mouth 3 (three) times daily.   DICLOFENAC SODIUM (VOLTAREN) 1 % GEL    Apply 1 application topically 3 (three) times daily. To feet, hands, knees, elbows  METHOCARBAMOL (ROBAXIN) 500 MG TABLET    Take 500 mg by mouth every 8 (eight) hours as needed for muscle spasms.   MORPHINE (MS CONTIN) 30 MG 12 HR TABLET    Take 1 tablet (30 mg total) by mouth every 8 (eight) hours.   OXYCODONE (OXY-IR) 5 MG CAPSULE    Take 10 mg by mouth every 4 (four) hours as needed for pain.   POLYETHYLENE GLYCOL (MIRALAX / GLYCOLAX) PACKET    Take 17 g by mouth 2 (two) times daily.   PRAZOSIN (MINIPRESS) 1 MG CAPSULE    Take 2 mg by mouth at bedtime.    RIVAROXABAN (XARELTO) 20 MG TABS TABLET    Take 1 tablet (20 mg total) by mouth daily with supper.   SENNOSIDES-DOCUSATE SODIUM (SENOKOT-S) 8.6-50 MG TABLET    Take 2  tablets by mouth 2 (two) times daily.   TRAMADOL (ULTRAM) 50 MG TABLET    Take by mouth 2 (two) times daily.  Modified Medications   No medications on file  Discontinued Medications   BUPROPION (WELLBUTRIN SR) 150 MG 12 HR TABLET    Take 150 mg by mouth daily.   BUSPIRONE (BUSPAR) 15 MG TABLET    Take 30 mg by mouth 2 (two) times daily.    CYCLOBENZAPRINE (FLEXERIL) 5 MG TABLET    Take 1 tablet (5 mg total) by mouth every 8 (eight) hours as needed for muscle spasms.   ETANERCEPT (ENBREL) 50 MG/ML INJECTION    Inject into the skin once a week.   SENNOSIDES-DOCUSATE SODIUM (SENOKOT-S) 8.6-50 MG TABLET    Take 1 tablet by mouth daily.   TESTOSTERONE CYPIONATE (DEPOTESTOTERONE CYPIONATE) 200 MG/ML INJECTION    Inject into the muscle once a week.     Physical Exam: Filed Vitals:   07/18/14 1858  BP: 103/57  Pulse: 88  Temp: 98 F (36.7 C)  Resp: 18  SpO2: 98%    General- adult male in no acute distress Head- normocephalic, atraumatic Throat- moist mucus membrane Eyes- PERRLA, EOMI, no pallor, no icterus, no discharge, normal conjunctiva, normal sclera Neck- no cervical lymphadenopathy Cardiovascular- normal s1,s2, no murmurs, palpable dorsalis pedis and radial pulses, left leg 1+ leg edema Respiratory- bilateral clear to auscultation, no wheeze, no rhonchi, no crackles, no use of accessory muscles Abdomen- bowel sounds present, soft, non tender Musculoskeletal- able to move all 4 extremities, left knee ROM limited, ted hose in both legs Neurological- no focal deficit Skin- warm and dry, left knee strei-strips and incision healing well Psychiatry- alert and oriented to person, place and time, normal mood and affect    Labs reviewed: none available for review   Assessment/Plan  Left knee OA S/p left knee arthroplasty. Has f/u with orthopedics. Will have him work with physical therapy and occupational therapy team to help with gait training and muscle strengthening  exercises.fall precautions. Skin care. Encourage to be out of bed. Continue morphine 30 mg bid with oxyIR 5 mg 2 tab q4h prn for breakthrough pain. Continue tramadol 50 mg bid as well. Continue xarelto for dvt prophylaxis  Muscle spasm Increase robaxin to 500 mg q6h prn for spasm and also reintroduce his home regimen flexeril 39m daily at bedtime  Constipation Continue miralax bid and senna s 2 tab bid, helping him. Hydration encouraged  PTSD Continue minipress and tranxene home regimen, no changes made, stable  DVT  With hx of protein c def, continue xarelto   Goals of care: short term rehabilitation   Labs/tests  ordered: cbc, cmp  Family/ staff Communication: reviewed care plan with patient and nursing supervisor    Blanchie Serve, MD  [facility] [phone] (Monday-Friday 8 am - 5 pm) [phone] (afterhours)

## 2014-07-20 ENCOUNTER — Other Ambulatory Visit (HOSPITAL_BASED_OUTPATIENT_CLINIC_OR_DEPARTMENT_OTHER): Payer: BLUE CROSS/BLUE SHIELD

## 2014-07-20 ENCOUNTER — Ambulatory Visit (HOSPITAL_BASED_OUTPATIENT_CLINIC_OR_DEPARTMENT_OTHER): Payer: BLUE CROSS/BLUE SHIELD | Admitting: Hematology

## 2014-07-20 ENCOUNTER — Ambulatory Visit: Payer: BLUE CROSS/BLUE SHIELD | Admitting: Psychology

## 2014-07-20 ENCOUNTER — Telehealth: Payer: Self-pay | Admitting: Hematology

## 2014-07-20 ENCOUNTER — Other Ambulatory Visit: Payer: Self-pay | Admitting: *Deleted

## 2014-07-20 ENCOUNTER — Encounter: Payer: Self-pay | Admitting: Hematology

## 2014-07-20 VITALS — BP 137/85 | HR 106 | Temp 98.5°F | Resp 20

## 2014-07-20 DIAGNOSIS — D473 Essential (hemorrhagic) thrombocythemia: Secondary | ICD-10-CM

## 2014-07-20 DIAGNOSIS — E291 Testicular hypofunction: Secondary | ICD-10-CM

## 2014-07-20 DIAGNOSIS — Z86718 Personal history of other venous thrombosis and embolism: Secondary | ICD-10-CM

## 2014-07-20 DIAGNOSIS — D751 Secondary polycythemia: Secondary | ICD-10-CM

## 2014-07-20 DIAGNOSIS — D509 Iron deficiency anemia, unspecified: Secondary | ICD-10-CM

## 2014-07-20 DIAGNOSIS — D6859 Other primary thrombophilia: Secondary | ICD-10-CM

## 2014-07-20 DIAGNOSIS — Z8673 Personal history of transient ischemic attack (TIA), and cerebral infarction without residual deficits: Secondary | ICD-10-CM

## 2014-07-20 DIAGNOSIS — Z7901 Long term (current) use of anticoagulants: Secondary | ICD-10-CM

## 2014-07-20 LAB — CBC WITH DIFFERENTIAL/PLATELET
BASO%: 0.5 % (ref 0.0–2.0)
Basophils Absolute: 0 10*3/uL (ref 0.0–0.1)
EOS%: 0.1 % (ref 0.0–7.0)
Eosinophils Absolute: 0 10*3/uL (ref 0.0–0.5)
HEMATOCRIT: 32.2 % — AB (ref 38.4–49.9)
HEMOGLOBIN: 10.3 g/dL — AB (ref 13.0–17.1)
LYMPH#: 0.8 10*3/uL — AB (ref 0.9–3.3)
LYMPH%: 21 % (ref 14.0–49.0)
MCH: 26.9 pg — ABNORMAL LOW (ref 27.2–33.4)
MCHC: 31.8 g/dL — AB (ref 32.0–36.0)
MCV: 84.6 fL (ref 79.3–98.0)
MONO#: 0.4 10*3/uL (ref 0.1–0.9)
MONO%: 9.7 % (ref 0.0–14.0)
NEUT#: 2.5 10*3/uL (ref 1.5–6.5)
NEUT%: 68.7 % (ref 39.0–75.0)
Platelets: 458 10*3/uL — ABNORMAL HIGH (ref 140–400)
RBC: 3.81 10*6/uL — AB (ref 4.20–5.82)
RDW: 15.9 % — ABNORMAL HIGH (ref 11.0–14.6)
WBC: 3.7 10*3/uL — ABNORMAL LOW (ref 4.0–10.3)

## 2014-07-20 MED ORDER — CLORAZEPATE DIPOTASSIUM 7.5 MG PO TABS: ORAL_TABLET | ORAL | Status: DC

## 2014-07-20 NOTE — Telephone Encounter (Signed)
Gave patient avs report and appointments for July.  °

## 2014-07-20 NOTE — Progress Notes (Signed)
[place] HEMATOLOGY FOLLOW UP NOTE DATE OF VISIT: 01/09/2014  Patient Care Team: Concepcion Elk, MD as PCP - General (Internal Medicine) Bernadene Bell, MD as Consulting Physician (Hematology) Aretta Nip, MD as Referring Physician (Family Medicine)  CHIEF COMPLAINTS Follow up DVT and polycythemia  HISTORY OF INITIAL PRESENTING ILLNESS (from Dr. Renella Cunas note):   [name] 49 y.o. 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 Patient is a former Software engineer. Patient had a fractured foot repair in 2007 and caused a right leg DVT. At the time he took Coumadin for 3 months.   In May of this year 2015 patient presented with right-sided weakness and slurring of speech, he felt tired, had some nausea and was having trouble gripping the soap when he was in the shower. He checked himself in the mirror there was no facial droop but based on the symptoms he texted his wife took him to the hospital where he was kept overnight. His symptoms of TIA lasted for more than 24 hours and he had appropriate neuro imaging done. When he was seen by a neurologist later, he was told that he had a left frontal stroke. This happened at a young age of 49 so his primary care physician ordered hypercoagulable panel and that showed a protein C deficiency. This TIA or stroke event took place on 07/27/2013 an MRI brain from that date showed changes of early chronic small vessel ischemic changes. A note from the previous hematologist Dr Laury Axon in [facility] indicated that at [facility] they also felt that it was not a stroke. Carotid ultrasound showed minimal atherosclerotic plaques. The hypercoagulable panel was drawn on 08/19/2013 and he was negative for factor V Leiden mutation, prothrombin gene mutation. He had normal levels of antithrombin III and protein S but the protein C level was decreased at 47%(reference range is 70-180%).  A repeat protein C activity done on 09/28/2013 was 58% still low. Lupus anticoagulant was negative. Beta-2 glycoprotein antibodies were negative.  Patient is currently receiving a baby aspirin for last 3 months. The hematologist in [place] Dr. Alain Marion also gave him some Lovenox injections for prophylactic use just before a long distance travel. He encouraged him to avoid inactivity/immobility. Patient was not given an anticoagulant such as warfarin or the new generation drugs like Xarelto or Eliquis.  On 04/28/2013 patient's CBC showed a white count of 6.2 hemoglobin 18.7 hematocrit 53.1 MCV 92 platelet count of 207. Differential was normal. Chemistry panel was normal creatinine 1.23. Patient does have history of elevated liver enzymes in the past and the etiology of that is not clear likely medication related. He had liver biopsy done in 2013 which was reportedly normal. Patient was taking Remicade for over an year and it was helping his skin lesions of psoriasis but not the joint pain. It was switched to Enbrel which he is still taking a subcutaneous shot once a week that is every Sunday.  Patient also have some pain control issues and was seeing a pain specialist in [place]. He is in the process of getting a referral to a pain specialist here. His pain medications include morphine extended release, oxycodone, occasional naproxen and methadone. Patient is also on antidepressants.  Patient also had a clot at PICC line for antibiotics in 2006. Patient had history of psoriasis and psoriatic arthritis being managed by a rheumatologist in [place]. Now he is shifting his care to a local rheumatologist here. Most  of his psoriasis involves the bottom of the feet mostly on the left side, hands, knees and ears.  Patient also had a history of right-sided parotitis documented in May 2015 that was treated with antibiotics. He does have chronic dry mouth because of sleep apnea as well as his antidepressant  therapy.  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 That itself can increase the risk for both arterial as well as venous thromboembolic events. 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 For his depression patient was on Prozac in March 2015 but it was causing decreased sexual libido and changed to Wellbutrin in March 2015. He does have mild constipation with the use of narcotics and takes Colace twice a day.  Patient is also complaining of 50 pound weight loss in the last 3-4 months. He thinks it is unintentional. He has been on immunosuppressant medication such as Remicade and Enbrel and sometimes those can be associated with possibility of non-Hodgkin lymphomas. He also have chronic pain and recently saw an orthopedic physician who recommended MRI of the neck which will be scheduled. He is also seeing a rheumatologist next month, a neurologist and a pain specialist. He does complain of left shoulder pain and left elbow pain. There was some discussion by previous rheumatologist in [place] about switching his biological therapy boost to Stelara or [place] Nail once he is evaluated by a rheumatologist. Patient had already received his flu shot as well as pneumonia shot. He also complains of having significant short-term memory problems and trouble concentrating which could be from his medications or TIA episode.  So patient's active hematologic problems are  #1 Protein C deficiency documented twice. #2 TIA/CVA in May 2015 #3 history of DVT in 2007 Right leg treated with Coumadin x 3 months. It was provoked by foot injury #3 Use of immunosuppressant medications which increase the likelihood of lymphomas. 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   INTERVAL HISTORY: Mr. Gunnels returns for  follow-up. He underwent left total knee replacement in [facility] on 07/03/2014. He tolerated the procedure well, and restarted several total on day 2 after surgery. He had a significant bruising, and a probable bleeding after surgery, his hemoglobin dropped to 8.5g/dl after surgery. He was sent to a rehabilitation after surgery, and is still slowly recovering. He is able to use a walker for short distance, but is well she most of time. His left knee and leg swelling are still significant, he did have left lower extremity ultrasound, which showed a chronic DVT, but no new thrombosis (per patient). He denies any bleeding. He is likely to be discharged from rehabilitation next week.  MEDICAL HISTORY:   Past Medical History  Diagnosis Date  . psoriatic arthritis   . H/O protein C deficiency   . Hx-TIA (transient ischemic attack)   . Clotting disorder   . Celiac disease   . H/O parotitis     right   . Polycythemia, secondary   . Transgendered   . Sleep apnea   . Neck pain   . Abnormal weight loss   . Gluten enteropathy   . Syrinx of spinal cord 01/06/2014 on MRI    c spine  . Anxiety     PTSD per patient    SURGICAL HISTORY:  Past Surgical History  Procedure Laterality Date  .  Thyroidectomy, partial  2008  . Cholecystectomy    . Ankle arthroscopy with reconstruction Right 2007  . Knee surgery Bilateral 1984    Right ACL, left PCL repair  . Liver biopsy  2013    normal results.  . Hip arthroscopy w/ labral repair Right 05/11/2013    SOCIAL HISTORY:  History   Social History  . Marital Status: Married    Spouse Name: N/A  . Number of Children: 2  . 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 Smoking status: Never Smoker   . Smokeless tobacco: Not on file  . Alcohol Use: Yes     Comment: wine once a month  . Drug Use: No  . Sexual Activity: Yes    Birth Control/  Protection: None     Comment: patient is a transgender on testosterone shots, no biological kids   Other Topics Concern  . Not on file   Social History Narrative   Education 4 year college, former Therapist, sports X 15 years, pediatric nurse practitioner x 6 years, did NP degree from [place] [place]. Relocated to [place] about 2 months ago from [place], [place]. Patient was in [place] for last 4 years and prior to that in [place]. His wife is working as Scientist, research (physical sciences) for [employer]. Patient is not working and applying for disability. They have 2 kids but no biologic children.     FAMILY HISTORY:  Family History  Problem Relation Age of Onset  . Stroke Paternal Uncle     age 69  . Stroke Maternal Grandfather     29  . Hypertension Mother   . Psoriasis Mother   . Stroke Maternal Grandmother   . Congestive Heart Failure Maternal Grandmother   . Cancer Paternal Grandfather   . Protein C deficiency Sister 38    Miscarriages    ALLERGIES:  is allergic to penicillin g; vancomycin; duloxetine; gabapentin; and sulfa antibiotics.  MEDICATIONS:  Current Outpatient Prescriptions  Medication Sig Dispense Refill  . calcipotriene (DOVONOX) 0.005 % cream Apply topically.    . clorazepate (TRANXENE) 7.5 MG tablet Take 7.5 mg by mouth 4 (four) times daily.    . cyclobenzaprine (FLEXERIL) 5 MG tablet Take 5 mg by mouth at bedtime.    . diclofenac sodium (VOLTAREN) 1 % GEL Apply 1 application topically 3 (three) times daily. To feet, hands, knees, elbows 3 Tube 4  . methocarbamol (ROBAXIN) 500 MG tablet Take 500 mg by mouth every 8 (eight) hours as needed for muscle spasms.    [name] morphine (MS CONTIN) 30 MG 12 hr tablet Take 1 tablet (30 mg total) by mouth every 8 (eight) hours. 90 tablet 0  . oxycodone (OXY-IR) 5 MG capsule Take 10 mg by mouth every 4 (four) hours as needed for pain.    . polyethylene glycol (MIRALAX / GLYCOLAX) packet Take 17 g by mouth 2 (two) times daily.    . prazosin (MINIPRESS) 1 MG  capsule Take 2 mg by mouth at bedtime.     . prazosin (MINIPRESS) 1 MG capsule Take 2 at night    . rivaroxaban (XARELTO) 20 MG TABS tablet Take 1 tablet (20 mg total) by mouth daily with supper. 30 tablet 3  . sennosides-docusate sodium (SENOKOT-S) 8.6-50 MG tablet Take 2 tablets by mouth 2 (two) times daily.    [name] testosterone cypionate (DEPOTESTOTERONE CYPIONATE)  200 MG/ML injection Inject 100 mg into the muscle.    . traMADol (ULTRAM) 50 MG tablet Take by mouth 2 (two) times daily.     No current facility-administered medications for this visit.    REVIEW OF SYSTEMS:   Constitutional: Denies fevers, chills or abnormal night sweats Eyes: Denies blurriness of vision, double vision or watery eyes Ears, nose, mouth, throat, and face: Denies mucositis or sore throat, +Dry mouth Respiratory: Denies cough, dyspnea or wheezes Cardiovascular: Denies palpitation, chest discomfort or lower extremity swelling Gastrointestinal:  Denies nausea, heartburn, + change in bowel habits that is Constipation Skin: Hx of psoriasis and arthropathy from it.  Lymphatics: Denies new lymphadenopathy or easy bruising, hx of right parotitis twice. Neurological:Denies numbness, tingling or new weaknesses, recent TIA March 2015 Behavioral/Psych: Mood is stable, no new changes, +depression and anxiety, +memory problems All other systems were reviewed with the patient and are negative.  PHYSICAL EXAMINATION: ECOG PERFORMANCE STATUS: 1  Filed Vitals:   07/20/14 1351  BP: 137/85  Pulse: 106  Temp: 98.5 F (36.9 C)  Resp: 20   GENERAL:alert, no distress and comfortable SKIN: skin color, texture, turgor are normal, no rashes or significant lesions EYES: normal, conjunctiva are pink and non-injected, sclera clear OROPHARYNX:no exudate, no erythema and lips, buccal mucosa, and tongue normal  NECK: supple, thyroid normal size, non-tender, without nodularity LYMPH:  no palpable lymphadenopathy in the cervical,  axillary or inguinal LUNGS: clear to auscultation and percussion with normal breathing effort HEART: regular rate & rhythm and no murmurs and no lower extremity edema ABDOMEN:abdomen soft, non-tender and normal bowel sounds Musculoskeletal:no cyanosis of digits and no clubbing  PSYCH: alert & oriented x 3 NEURO: no focal motor/sensory deficits EXT: The surgical scar at the left knee Is healing well, no discharge or skin erythema. (+) Pitting edema up to left thigh, and a resolving ecchymosis around the knee.  LABORATORY DATA:  I have reviewed the data as listed CBC Latest Ref Rng 07/20/2014 06/07/2014 05/10/2014  WBC 4.0 - 10.3 10e3/uL 3.7(L) 3.7(L) 3.6(L)  Hemoglobin 13.0 - 17.1 g/dL 10.3(L) 13.6 13.8  Hematocrit 38.4 - 49.9 % 32.2(L) 42.5 43.2  Platelets 140 - 400 10e3/uL 458(H) 171 174   Erythropoietin  Status: Finalresult Visible to patient:  MyChart Nextappt: Today at 10:15 AM in Oncology [facility] Medico, Krista Blue, MD) Dx:  Polycythemia, secondary         Ref Range 2wk ago    Erythropoietin 2.6 - 18.5 mIU/mL 17.0         Pathology report Diagnosis Bone Marrow, Aspirate,Biopsy, and Clot, right iliac - SLIGHTLY HYPERCELLULAR BONE MARROW FOR AGE WITH TRILINEAGE HEMATOPOIESIS. - SEE COMMENT. PERIPHERAL BLOOD: - NO SIGNIFICANT MORPHOLOGIC ABNORMALITIES. Diagnosis Note The bone marrow is slightly hypercellular with trilineage hematopoiesis and essentially orderly and progressive maturation of all cell types. Significant dyspoiesis is not present. Iron stores are markedly decreased to absent and hence correlation with serum iron studies is recommended. There is no morphologic evidence of a myeloproliferative process in this material. Correlation with cytogenetic studies is recommended. (BNS:ecj 2014-04-15)   RADIOGRAPHIC STUDIES: No results found.  ASSESSMENT & PLAN:   1. 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 He has slightly hypercellular bone marrow with trilineage hematopoiesis.  No morphology evidence of myeloproliferative process. Cytogenetics was normal (no Y chromosome).  JAK2 mutation was negative. -He had Hemoglobin 18.7 hematocrit 53.1 in 04/2013. His able level is normal.  His presentation is consistent with secondary polycythemia, likely secondary to testosterone injections. 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 He does have hot flash and other symptoms without testosterone injection. I recommend him to maintain his testosterone level at low normal limits, and he agrees.  -We discussed that secondary polycythemia is usually benign, risk of thrombosis is relatively low than PV. However he did have history of stroke, and he feels fatigued when his hemoglobin is high. He agrees to continue phlebotomy as needed. -He developed anemia after the surgery secondary to blood loss, hemoglobin 10.3 today.  -We'll repeat CBC in 2 months   2. History of DVT and stroke, Protein C deficiency  - continue Xarelto indefinitely given his underlying protein C deficiency and history of DVT and stroke.   3. History of CVA -His primary care physician is setting up a referral to a neurologist at [facility]. He will follow  4. 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  -We discussed that excessive testosterone would increase the risk of thrombosis and worsen his polycythemia. I prefer him to maintain low normal level of testosterone. He agrees.  5. Left knee replacement on 07/03/2014 -Continue rehabilitation and physical therapy  6. Mild thrombocytosis -Likely reactive to his iron deficient anemia -Continue monitoring, would not replace his iron due to his history of  polycythemia  Plan -Hold phlebotomy for the next 2 months due to his anemia after surgery -Return to clinic with lab in 2 months  All questions were answered. The patient knows to call the clinic with any problems, questions or concerns.  I spent 20 minutes counseling the patient face to face. The total time spent in the appointment was 25 minutes and more than 50% was on counseling.   [name]  07/20/2014

## 2014-07-21 ENCOUNTER — Other Ambulatory Visit: Payer: Self-pay | Admitting: *Deleted

## 2014-07-21 MED ORDER — TRAMADOL HCL 50 MG PO TABS: ORAL_TABLET | ORAL | Status: DC

## 2014-07-21 NOTE — Telephone Encounter (Signed)
[place]

## 2014-07-25 ENCOUNTER — Ambulatory Visit (INDEPENDENT_AMBULATORY_CARE_PROVIDER_SITE_OTHER): Payer: BLUE CROSS/BLUE SHIELD | Admitting: Psychology

## 2014-07-25 ENCOUNTER — Encounter: Payer: BLUE CROSS/BLUE SHIELD | Attending: Physical Medicine & Rehabilitation | Admitting: Registered Nurse

## 2014-07-25 ENCOUNTER — Encounter: Payer: Self-pay | Admitting: Physical Medicine & Rehabilitation

## 2014-07-25 ENCOUNTER — Encounter: Payer: Self-pay | Admitting: Registered Nurse

## 2014-07-25 VITALS — BP 118/73 | HR 112 | Resp 14

## 2014-07-25 DIAGNOSIS — D6859 Other primary thrombophilia: Secondary | ICD-10-CM | POA: Diagnosis not present

## 2014-07-25 DIAGNOSIS — Z5181 Encounter for therapeutic drug level monitoring: Secondary | ICD-10-CM

## 2014-07-25 DIAGNOSIS — K900 Celiac disease: Secondary | ICD-10-CM | POA: Diagnosis not present

## 2014-07-25 DIAGNOSIS — Z7901 Long term (current) use of anticoagulants: Secondary | ICD-10-CM | POA: Diagnosis not present

## 2014-07-25 DIAGNOSIS — G8101 Flaccid hemiplegia affecting right dominant side: Secondary | ICD-10-CM

## 2014-07-25 DIAGNOSIS — M13862 Other specified arthritis, left knee: Secondary | ICD-10-CM | POA: Diagnosis not present

## 2014-07-25 DIAGNOSIS — D751 Secondary polycythemia: Secondary | ICD-10-CM | POA: Diagnosis not present

## 2014-07-25 DIAGNOSIS — Z86718 Personal history of other venous thrombosis and embolism: Secondary | ICD-10-CM | POA: Diagnosis not present

## 2014-07-25 DIAGNOSIS — M7522 Bicipital tendinitis, left shoulder: Secondary | ICD-10-CM | POA: Insufficient documentation

## 2014-07-25 DIAGNOSIS — Z79891 Long term (current) use of opiate analgesic: Secondary | ICD-10-CM | POA: Insufficient documentation

## 2014-07-25 DIAGNOSIS — F4310 Post-traumatic stress disorder, unspecified: Secondary | ICD-10-CM

## 2014-07-25 DIAGNOSIS — M545 Low back pain: Secondary | ICD-10-CM | POA: Insufficient documentation

## 2014-07-25 DIAGNOSIS — R209 Unspecified disturbances of skin sensation: Secondary | ICD-10-CM | POA: Insufficient documentation

## 2014-07-25 DIAGNOSIS — Z87890 Personal history of sex reassignment: Secondary | ICD-10-CM | POA: Insufficient documentation

## 2014-07-25 DIAGNOSIS — I69320 Aphasia following cerebral infarction: Secondary | ICD-10-CM | POA: Diagnosis not present

## 2014-07-25 DIAGNOSIS — Z9181 History of falling: Secondary | ICD-10-CM | POA: Insufficient documentation

## 2014-07-25 DIAGNOSIS — F418 Other specified anxiety disorders: Secondary | ICD-10-CM | POA: Diagnosis not present

## 2014-07-25 DIAGNOSIS — I69398 Other sequelae of cerebral infarction: Secondary | ICD-10-CM | POA: Insufficient documentation

## 2014-07-25 DIAGNOSIS — G894 Chronic pain syndrome: Secondary | ICD-10-CM | POA: Diagnosis not present

## 2014-07-25 DIAGNOSIS — S43002A Unspecified subluxation of left shoulder joint, initial encounter: Secondary | ICD-10-CM | POA: Diagnosis not present

## 2014-07-25 DIAGNOSIS — G8100 Flaccid hemiplegia affecting unspecified side: Secondary | ICD-10-CM

## 2014-07-25 DIAGNOSIS — Z79899 Other long term (current) drug therapy: Secondary | ICD-10-CM

## 2014-07-25 DIAGNOSIS — I69351 Hemiplegia and hemiparesis following cerebral infarction affecting right dominant side: Secondary | ICD-10-CM | POA: Diagnosis not present

## 2014-07-25 DIAGNOSIS — Z96652 Presence of left artificial knee joint: Secondary | ICD-10-CM | POA: Diagnosis not present

## 2014-07-25 DIAGNOSIS — L4050 Arthropathic psoriasis, unspecified: Secondary | ICD-10-CM

## 2014-07-25 DIAGNOSIS — G8929 Other chronic pain: Secondary | ICD-10-CM | POA: Insufficient documentation

## 2014-07-25 NOTE — Progress Notes (Signed)
Subjective:    Patient ID: Jesse Bowers, male    DOB: 02/12/1966, 49 y.o.   MRN: [id number]  HPI: Jesse Bowers is a 49 year old male who returns for follow-up and medication refill. He says his pain is located in his bilateral hands, bilateral shoulder's left greater than right, left knee, and bilateral feet. Also complaining of joint pain.He rates his pain 9. His current exercise regime is attending physical therapy and occupational therapy daily at [facility]. He had  Left total knee replacement on May 2,2016 via Dr. Len Childs. He's complaining increase joint pain since his embrel has been discontinued for 6 weeks. Jesse Bowers also states his chronic pain is not being controlled with the MS Contin.  I will speak with Dr. [name] and give him a call he verbalizes understanding. I didn't give him a prescription at this time.  I spoke with Dr. [name] on 07/26/2014. We will continue MS Contin, and will prescribed oxycodone 100m every 6 hours for one month. Dr. SNaaman Plummerwill re-evaluate in June this was relayed to Mrs. TKokeshshe verbalizes understanding.  I called Mr. TStefanunable to leave a message.  [facility]   Pain Inventory Average Pain 8 Pain Right Now 9 My pain is constant, sharp and dull  In the last 24 hours, has pain interfered with the following? General activity 10 Relation with others 10 Enjoyment of life 10 What TIME of day is your pain at its worst? morning, night Sleep (in general) Poor  Pain is worse with: walking, bending, sitting, inactivity, standing and some activites Pain improves with: heat/ice and medication Relief from Meds: 2  Mobility walk with assistance use a walker ability to climb steps?  no do you drive?  no use a wheelchair needs help with transfers  Function disabled: date disabled . Do you have any goals in this area?  no  Neuro/Psych trouble walking  Prior  Studies Any changes since last visit?  yes x-rays CT/MRI doppler, Left knee replacement  Physicians involved in your care Any changes since last visit?  yes   Family History  Problem Relation Age of Onset  . Stroke Paternal Uncle     age 49 . Stroke Maternal Grandfather     565 . Hypertension Mother   . Psoriasis Mother   . Stroke Maternal Grandmother   . Congestive Heart Failure Maternal Grandmother   . Cancer Paternal Grandfather   . Protein C deficiency Sister 321   Miscarriages   History   Social History  . Marital Status: Married    Spouse Name: N/A  . Number of Children: 2  . 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 Smoking status: Never Smoker   . Smokeless tobacco: Not on file  . Alcohol Use: Yes     Comment: wine once a month  . Drug Use: No  . Sexual Activity: Yes    Birth Control/ Protection: None     Comment: patient is a transgender on testosterone shots, no biological kids   Other Topics Concern  . 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 [place] was in [place] last 4 years and prior to that in [place]. His wife is working as Scientist, research (physical sciences) for [employer]. Patient is not working and applying for disability. They have 2 kids but no biologic children.    Past Surgical History  Procedure Laterality Date  . Thyroidectomy, partial  2008  . Cholecystectomy    . Ankle arthroscopy with reconstruction Right 2007  . Knee surgery Bilateral 1984    Right ACL, left PCL repair  . Liver biopsy  2013    normal results.  . Hip arthroscopy w/ labral repair Right 05/11/2013   Past Medical History  Diagnosis Date  . psoriatic arthritis   . H/O protein C deficiency     . Hx-TIA (transient ischemic attack)   . Clotting disorder   . Celiac disease   . H/O parotitis     right   . Polycythemia, secondary   . Transgendered   . Sleep apnea   . Neck pain   . Abnormal weight loss   . Gluten enteropathy   . Syrinx of spinal cord 01/06/2014 on MRI    c spine  . Anxiety     PTSD per patient   BP 118/73 mmHg  Pulse 112  Resp 14  SpO2 98%  Opioid Risk Score:   Fall Risk Score: Moderate Fall Risk (6-13 points) (patient previously educated)`1  Depression screen PHQ 2/9  Depression screen PHQ 2/9 06/12/2014  Decreased Interest 2  Down, Depressed, Hopeless 0  PHQ - 2 Score 2  Altered sleeping 3  Tired, decreased energy 3  Change in appetite 0  Feeling bad or failure about yourself  0  Trouble concentrating 3  Moving slowly or fidgety/restless 0  Suicidal thoughts 0  PHQ-9 Score 11     Review of Systems  Constitutional:       Weight loss  Gastrointestinal: Positive for constipation.  Musculoskeletal: Positive for gait problem.  Hematological: Bruises/bleeds easily.  All other systems reviewed and are negative.      Objective:   Physical Exam  Constitutional: He is oriented to person, place, and time. He appears well-developed and well-nourished.  HENT:  Head: Normocephalic and atraumatic.  Neck: Normal range of motion. Neck supple.  Cardiovascular: Normal rate and regular rhythm.   Pulmonary/Chest: Effort normal and breath sounds normal.  Musculoskeletal:  Normal Muscle Bulk and Muscle Testing Reveals: Upper Extremities: Full ROM and Muscle Strength 5/5 Thoracic Paraspinal Tenderness T-10-T-12 Right Greater Trochanteric Tenderness Lower Extremities: Right Full ROM and Muscle Strength 5/5. Right AFO Left: Decreased ROM/ Patella Swelling Noted Arrived in wheelchair   Neurological: He is alert and oriented to person, place, and time.  Skin: Skin is warm and dry.  Psychiatric: He has a normal mood and affect.  Nursing note and  vitals reviewed.         Assessment & Plan:  1. Psoriatic arthritis with pain in multiple areas, most prominently feet, hands, elbows.: Refilled:MS Contin 30 mg 12 hr tablet one tablet every 8 hours #90. 2. 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 Patello-femoral arthritis left knee: Continue Voltaren Gel S/P TKR on 07/03/14:  4. Chronic mid- low back pain: Continue current medication regime, and encourage to increase activity as tolerated. 5. Polycythemia: Oncology Following.  6. Depression with anxiety : Continue Wellbutrin and Buspar 7. S/P Left TKR: Dr. Len Childs Following:  RX: Oxycodone 10 mg one tablet every 6 hours as needed for moderate pain #120.  30 minutes of face to face patient care time was spent during this visit. All questions were encouraged and answered.   F/U in 1 month

## 2014-07-26 ENCOUNTER — Encounter: Payer: Self-pay | Admitting: Adult Health

## 2014-07-26 MED ORDER — OXYCODONE HCL 10 MG PO TABS: 10.0000 mg | ORAL_TABLET | Freq: Four times a day (QID) | ORAL | Status: DC | PRN

## 2014-07-26 MED ORDER — MORPHINE SULFATE ER 30 MG PO TBCR: 30.0000 mg | EXTENDED_RELEASE_TABLET | Freq: Three times a day (TID) | ORAL | Status: DC

## 2014-07-26 NOTE — Progress Notes (Signed)
Patient ID: Jesse Bowers, male   DOB: 1965-05-18, 49 y.o.   MRN: [id number]   07/17/14  Facility:  Nursing Home Location:  [place] Room Number: 662-H LEVEL OF CARE:  SNF (31)   Chief Complaint  Patient presents with  . Hospitalization Follow-up    Osteoarthritis S/P left total knee arthroplasty, muscle spasm, PTSD, DVT history and constipation    HISTORY OF PRESENT ILLNESS:  This is a 49 year old male who has been admitted to [facility] on 07/14/14 from another rehabilitation facility. He was recently discharged from [facility] with osteoarthritis S/P left total knee arthroplasty. He has been admitted to [facility] continue short-term rehabilitation. He complains of constipation.  PAST MEDICAL HISTORY:  Past Medical History  Diagnosis Date  . psoriatic arthritis   . H/O protein C deficiency   . Hx-TIA (transient ischemic attack)   . Clotting disorder   . Celiac disease   . H/O parotitis     right   . Polycythemia, secondary   . Transgendered   . Sleep apnea   . Neck pain   . Abnormal weight loss   . Gluten enteropathy   . Syrinx of spinal cord 01/06/2014 on MRI    c spine  . Anxiety     PTSD per patient    CURRENT MEDICATIONS: Reviewed per MAR/see medication list  Allergies  Allergen Reactions  . Penicillin G Anaphylaxis  . Vancomycin Rash  . Duloxetine     Restless legs  . Gabapentin Nausea Only  . Sulfa Antibiotics Rash    Stevens-Johnson rash     REVIEW OF SYSTEMS:  GENERAL: no change in appetite, no fatigue, no weight changes, no fever, chills or weakness RESPIRATORY: no cough, SOB, DOE, wheezing, hemoptysis CARDIAC: no chest pain, or palpitations GI: no abdominal pain, diarrhea, heart burn, nausea or vomiting, + constipation  PHYSICAL EXAMINATION  GENERAL: no acute distress SKIN:  Left knee surgical incision is dry, has steri-strips, no redness EYES: conjunctivae normal, sclerae normal, normal  eye lids NECK: supple, trachea midline, no neck masses, no thyroid tenderness, no thyromegaly LYMPHATICS: no LAN in the neck, no supraclavicular LAN RESPIRATORY: breathing is even & unlabored, BS CTAB CARDIAC: RRR, no murmur,no extra heart sounds, LLE edema 1+ GI: abdomen soft, normal BS, no masses, no tenderness, no hepatomegaly, no splenomegaly EXTREMITIES: Able to move 4 extremities PSYCHIATRIC: the patient is alert & oriented to person, affect & behavior appropriate  LABS/RADIOLOGY: 07/11/14  venous Doppler of LLE is negative for DVT  ASSESSMENT/PLAN:  Osteoarthritis S/P left total knee arthroplasty - for rehabilitation; continue morphine ER 30 mg by mouth 3 times a day, Ultram 50 mg by mouth twice a day and OxyIR 5 mg 2 tabs every 4 hours when necessary for pain Right LLE DVT - continue Xarelto 20 mg 1 tab by mouth daily at bedtime PTSD - mood is stable; continue Minipress 1 mg 2 capsules by mouth daily at bedtime Muscle spasm - continue Robaxin 750 mg by mouth every 6 hours and Flexeril 5 mg by mouth daily at bedtime Constipation - start senna S2 tabs by mouth twice a day and MiraLAX 17 g by mouth twice a day   Goals of care:  Short-term rehabilitation  Spent 50 minutes in patient care.    [facility], NP [employer] [phone]

## 2014-07-27 ENCOUNTER — Ambulatory Visit: Payer: BLUE CROSS/BLUE SHIELD | Admitting: Psychology

## 2014-07-27 ENCOUNTER — Encounter: Payer: Self-pay | Admitting: Adult Health

## 2014-07-27 ENCOUNTER — Ambulatory Visit: Payer: Self-pay | Admitting: Registered Nurse

## 2014-07-27 ENCOUNTER — Non-Acute Institutional Stay (SKILLED_NURSING_FACILITY): Payer: BLUE CROSS/BLUE SHIELD | Admitting: Adult Health

## 2014-07-27 DIAGNOSIS — I82401 Acute embolism and thrombosis of unspecified deep veins of right lower extremity: Secondary | ICD-10-CM | POA: Diagnosis not present

## 2014-07-27 DIAGNOSIS — G894 Chronic pain syndrome: Secondary | ICD-10-CM

## 2014-07-27 DIAGNOSIS — F4310 Post-traumatic stress disorder, unspecified: Secondary | ICD-10-CM

## 2014-07-27 DIAGNOSIS — M62838 Other muscle spasm: Secondary | ICD-10-CM

## 2014-07-27 DIAGNOSIS — M1712 Unilateral primary osteoarthritis, left knee: Secondary | ICD-10-CM | POA: Diagnosis not present

## 2014-07-27 DIAGNOSIS — L4050 Arthropathic psoriasis, unspecified: Secondary | ICD-10-CM | POA: Diagnosis not present

## 2014-07-27 DIAGNOSIS — K5900 Constipation, unspecified: Secondary | ICD-10-CM | POA: Diagnosis not present

## 2014-07-27 NOTE — Progress Notes (Signed)
Patient ID: Jesse Bowers, male   DOB: [birth year], 49 y.o.   MRN: [id number]   07/27/14  Facility:  Nursing Home Location:  [place] Room Number: 409-W LEVEL OF CARE:  SNF (31)   Chief Complaint  Patient presents with  . Discharge Note    Osteoarthritis S/P left total knee arthroplasty, psoriatric arthritis chronic pain syndrome, muscle spasm, PTSD, DVT history and constipation    HISTORY OF PRESENT ILLNESS:  This is a 49 year old male who is for discharge home with Home health PT and OT. DME: semi-electric bed since patient suffers from chronic bilateral shoulder and left knee pain which is caused by psoriatic arthritis. He has been admitted to [facility] on 07/14/14 from another rehabilitation facility. He was recently discharged from [facility] with osteoarthritis S/P left total knee arthroplasty.   Patient was admitted to this facility for short-term rehabilitation after the patient's recent hospitalization.  Patient has completed SNF rehabilitation and therapy has cleared the patient for discharge.  PAST MEDICAL HISTORY:  Past Medical History  Diagnosis Date  . psoriatic arthritis   . H/O protein C deficiency   . Hx-TIA (transient ischemic attack)   . Clotting disorder   . Celiac disease   . H/O parotitis     right   . Polycythemia, secondary   . Transgendered   . Sleep apnea   . Neck pain   . Abnormal weight loss   . Gluten enteropathy   . Syrinx of spinal cord 01/06/2014 on MRI    c spine  . Anxiety     PTSD per patient    CURRENT MEDICATIONS: Reviewed per MAR/see medication list  Allergies  Allergen Reactions  . Penicillin G Anaphylaxis  . Vancomycin Rash  . Duloxetine     Restless legs  . Gabapentin Nausea Only  . Sulfa Antibiotics Rash    Stevens-Johnson rash     REVIEW OF SYSTEMS:  GENERAL: no change in appetite, no fatigue, no weight changes, no fever, chills or weakness RESPIRATORY: no cough, SOB,  DOE, wheezing, hemoptysis CARDIAC: no chest pain, or palpitations GI: no abdominal pain, diarrhea, heart burn, nausea or vomiting  PHYSICAL EXAMINATION  GENERAL: no acute distress SKIN:  Left knee surgical incision is dry, has steri-strips, no redness NECK: supple, trachea midline, no neck masses, no thyroid tenderness, no thyromegaly LYMPHATICS: no LAN in the neck, no supraclavicular LAN RESPIRATORY: breathing is even & unlabored, BS CTAB CARDIAC: RRR, no murmur,no extra heart sounds, LLE edema 1+ GI: abdomen soft, normal BS, no masses, no tenderness, no hepatomegaly, no splenomegaly EXTREMITIES: Able to move 4 extremities PSYCHIATRIC: the patient is alert & oriented to person, affect & behavior appropriate  LABS/RADIOLOGY: Labs reviewed: 07/19/14  WBC 3.1 hemoglobin 9.5 hematocrit 29.4 MCV 83.4 sodium 138 potassium 4.6 glucose 82 BUN 15 creatinine 1.13 total bilirubin 1.2 alkaline phosphatase 412 SGOT 67 SGPT 35 total protein 5.5 albumin 3.5 calcium 8.2 07/11/14  venous Doppler of LLE is negative for DVT  ASSESSMENT/PLAN:  Osteoarthritis S/P left total knee arthroplasty - for Home health PT and OT; continue MS Contin ER 30 mg by mouth Q 8 hours, Ultram 50 mg by mouth twice a day and OxyIR 5 mg 2 tabs every 4 hours when necessary for pain Right LLE DVT - continue Xarelto 20 mg 1 tab by mouth daily at bedtime PTSD - mood is stable; continue Minipress 1 mg 2 capsules by mouth daily at bedtime Muscle spasm -  continue Robaxin 750 mg by mouth every 6 hours and Flexeril 5 mg by mouth daily at bedtime Constipation - continue senna S2 tabs by mouth twice a day and MiraLAX 17 g by mouth twice a day Psoriatic Arthritis - continue MS Contin 30 mg ER PO Q 8 hours Chronic Pain - continue current pain meds    I have filled out patient's discharge paperwork and written prescriptions.  Patient will receive home health PT and OT.  DME provided:  Semi-electric bed  Total discharge time: Greater  than 30 minutes  Discharge time involved coordination of the discharge process with Education officer, museum, nursing staff and therapy department. Medical justification for home health services/DME verified.    [facility], NP [employer] [phone]

## 2014-08-08 ENCOUNTER — Encounter: Payer: Self-pay | Admitting: Physical Medicine & Rehabilitation

## 2014-08-08 ENCOUNTER — Ambulatory Visit (INDEPENDENT_AMBULATORY_CARE_PROVIDER_SITE_OTHER): Payer: BLUE CROSS/BLUE SHIELD | Admitting: Psychology

## 2014-08-08 DIAGNOSIS — F4310 Post-traumatic stress disorder, unspecified: Secondary | ICD-10-CM | POA: Diagnosis not present

## 2014-08-09 ENCOUNTER — Ambulatory Visit: Payer: Self-pay | Admitting: Neurology

## 2014-08-10 ENCOUNTER — Ambulatory Visit (INDEPENDENT_AMBULATORY_CARE_PROVIDER_SITE_OTHER): Payer: BLUE CROSS/BLUE SHIELD | Admitting: Psychology

## 2014-08-10 DIAGNOSIS — F4310 Post-traumatic stress disorder, unspecified: Secondary | ICD-10-CM | POA: Diagnosis not present

## 2014-08-15 ENCOUNTER — Ambulatory Visit (INDEPENDENT_AMBULATORY_CARE_PROVIDER_SITE_OTHER): Payer: BLUE CROSS/BLUE SHIELD | Admitting: Psychology

## 2014-08-15 DIAGNOSIS — F4310 Post-traumatic stress disorder, unspecified: Secondary | ICD-10-CM

## 2014-08-17 ENCOUNTER — Ambulatory Visit (INDEPENDENT_AMBULATORY_CARE_PROVIDER_SITE_OTHER): Payer: BLUE CROSS/BLUE SHIELD | Admitting: Psychology

## 2014-08-17 DIAGNOSIS — F4310 Post-traumatic stress disorder, unspecified: Secondary | ICD-10-CM

## 2014-08-17 DIAGNOSIS — M24669 Ankylosis, unspecified knee: Secondary | ICD-10-CM | POA: Insufficient documentation

## 2014-08-17 DIAGNOSIS — G5622 Lesion of ulnar nerve, left upper limb: Secondary | ICD-10-CM | POA: Insufficient documentation

## 2014-08-18 DIAGNOSIS — I6782 Cerebral ischemia: Secondary | ICD-10-CM | POA: Insufficient documentation

## 2014-08-22 ENCOUNTER — Encounter
Payer: BLUE CROSS/BLUE SHIELD | Attending: Physical Medicine & Rehabilitation | Admitting: Physical Medicine & Rehabilitation

## 2014-08-22 ENCOUNTER — Encounter: Payer: Self-pay | Admitting: Physical Medicine & Rehabilitation

## 2014-08-22 ENCOUNTER — Ambulatory Visit (INDEPENDENT_AMBULATORY_CARE_PROVIDER_SITE_OTHER): Payer: BLUE CROSS/BLUE SHIELD | Admitting: Psychology

## 2014-08-22 VITALS — BP 116/60 | HR 79

## 2014-08-22 DIAGNOSIS — M13862 Other specified arthritis, left knee: Secondary | ICD-10-CM | POA: Diagnosis not present

## 2014-08-22 DIAGNOSIS — F418 Other specified anxiety disorders: Secondary | ICD-10-CM | POA: Insufficient documentation

## 2014-08-22 DIAGNOSIS — Z96652 Presence of left artificial knee joint: Secondary | ICD-10-CM | POA: Diagnosis not present

## 2014-08-22 DIAGNOSIS — G8929 Other chronic pain: Secondary | ICD-10-CM | POA: Insufficient documentation

## 2014-08-22 DIAGNOSIS — D6859 Other primary thrombophilia: Secondary | ICD-10-CM | POA: Diagnosis not present

## 2014-08-22 DIAGNOSIS — Z79899 Other long term (current) drug therapy: Secondary | ICD-10-CM | POA: Diagnosis not present

## 2014-08-22 DIAGNOSIS — I69320 Aphasia following cerebral infarction: Secondary | ICD-10-CM | POA: Diagnosis not present

## 2014-08-22 DIAGNOSIS — Z87890 Personal history of sex reassignment: Secondary | ICD-10-CM | POA: Diagnosis not present

## 2014-08-22 DIAGNOSIS — R209 Unspecified disturbances of skin sensation: Secondary | ICD-10-CM | POA: Insufficient documentation

## 2014-08-22 DIAGNOSIS — Z7901 Long term (current) use of anticoagulants: Secondary | ICD-10-CM | POA: Insufficient documentation

## 2014-08-22 DIAGNOSIS — D751 Secondary polycythemia: Secondary | ICD-10-CM | POA: Insufficient documentation

## 2014-08-22 DIAGNOSIS — S43002A Unspecified subluxation of left shoulder joint, initial encounter: Secondary | ICD-10-CM | POA: Insufficient documentation

## 2014-08-22 DIAGNOSIS — Z79891 Long term (current) use of opiate analgesic: Secondary | ICD-10-CM | POA: Diagnosis not present

## 2014-08-22 DIAGNOSIS — Z86718 Personal history of other venous thrombosis and embolism: Secondary | ICD-10-CM | POA: Insufficient documentation

## 2014-08-22 DIAGNOSIS — M7712 Lateral epicondylitis, left elbow: Secondary | ICD-10-CM | POA: Diagnosis not present

## 2014-08-22 DIAGNOSIS — I69398 Other sequelae of cerebral infarction: Secondary | ICD-10-CM | POA: Insufficient documentation

## 2014-08-22 DIAGNOSIS — M7522 Bicipital tendinitis, left shoulder: Secondary | ICD-10-CM | POA: Insufficient documentation

## 2014-08-22 DIAGNOSIS — L4050 Arthropathic psoriasis, unspecified: Secondary | ICD-10-CM | POA: Insufficient documentation

## 2014-08-22 DIAGNOSIS — K900 Celiac disease: Secondary | ICD-10-CM | POA: Diagnosis not present

## 2014-08-22 DIAGNOSIS — I69351 Hemiplegia and hemiparesis following cerebral infarction affecting right dominant side: Secondary | ICD-10-CM | POA: Diagnosis not present

## 2014-08-22 DIAGNOSIS — F4310 Post-traumatic stress disorder, unspecified: Secondary | ICD-10-CM

## 2014-08-22 DIAGNOSIS — G8100 Flaccid hemiplegia affecting unspecified side: Secondary | ICD-10-CM | POA: Diagnosis not present

## 2014-08-22 DIAGNOSIS — M545 Low back pain: Secondary | ICD-10-CM | POA: Insufficient documentation

## 2014-08-22 DIAGNOSIS — Z9181 History of falling: Secondary | ICD-10-CM | POA: Diagnosis not present

## 2014-08-22 DIAGNOSIS — G8101 Flaccid hemiplegia affecting right dominant side: Secondary | ICD-10-CM

## 2014-08-22 MED ORDER — OXYCODONE HCL 10 MG PO TABS: 10.0000 mg | ORAL_TABLET | Freq: Three times a day (TID) | ORAL | Status: DC | PRN

## 2014-08-22 MED ORDER — MORPHINE SULFATE ER 30 MG PO TBCR: 30.0000 mg | EXTENDED_RELEASE_TABLET | Freq: Three times a day (TID) | ORAL | Status: DC

## 2014-08-22 NOTE — Progress Notes (Signed)
Subjective:    Patient ID: Jesse Bowers, male    DOB: 10-17-1965, 49 y.o.   MRN: [id number]  HPI   Che is here in follow up of his chronic pain. He had left knee replacement on Jul 03, 2014 at [facility]. He had a poor rehab experience at local SNF's and his knee wasn't addressed aggressively initially, and now he needs a knee manipulation later this month? He was placed on oxycodone for breakthrough pain--he is using it only twice per day prn at this time.  The right AFO is working well for him. He hasn't fallen since receiving.   He had good results with the left biceps injection although his shoulder has been acting up since he's had to use a walker. He also has noticed pain in left elbow.  He did not go for his FCE because of the TKA.         Pain Inventory Average Pain 7 Pain Right Now 7 My pain is sharp, burning, dull and aching  In the last 24 hours, has pain interfered with the following? General activity 9 Relation with others 9 Enjoyment of life 9 What TIME of day is your pain at its worst? morning, evening, night Sleep (in general) Poor  Pain is worse with: walking, bending, sitting, standing and some activites Pain improves with: rest, heat/ice, therapy/exercise and medication Relief from Meds: 2  Mobility walk with assistance use a walker ability to climb steps?  yes do you drive?  yes  Function disabled: date disabled . I need assistance with the following:  dressing, bathing, meal prep, household duties and shopping  Neuro/Psych trouble walking spasms anxiety  Prior Studies Any changes since last visit?  no  Physicians involved in your care Any changes since last visit?  no   Family History  Problem Relation Age of Onset  . Stroke Paternal Uncle     age 26  . Stroke Maternal Grandfather     5  . Hypertension Mother   . Psoriasis Mother   . Stroke Maternal Grandmother   . Congestive Heart Failure Maternal Grandmother   .  Cancer Paternal Grandfather   . Protein C deficiency Sister 38    Miscarriages   History   Social History  . Marital Status: Married    Spouse Name: N/A  . Number of Children: 2  . 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 Smoking status: Never Smoker   . Smokeless tobacco: Not on file  . Alcohol Use: Yes     Comment: wine once a month  . Drug Use: No  . Sexual Activity: Yes    Birth Control/ Protection: None     Comment: patient is a transgender on testosterone shots, no biological kids   Other Topics Concern  . 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 Relocated to [place] about 2 months ago from [place], [place]. Patient was in [place] for last 4 years and prior to that in [place]. His wife is working as Scientist, research (physical sciences) for [employer]. Patient is not working and applying for disability. They have 2 kids but no biologic children.    Past Surgical History  Procedure Laterality Date  . Thyroidectomy, partial  2008  . Cholecystectomy    . Ankle arthroscopy with reconstruction Right 2007  . Knee surgery Bilateral 1984    Right ACL, left PCL repair  . Liver biopsy  2013    normal results.  . Hip arthroscopy w/ labral repair Right 05/11/2013   Past Medical History  Diagnosis Date  . psoriatic arthritis   . H/O protein C deficiency   . Hx-TIA (transient ischemic attack)   . Clotting disorder   . Celiac disease   . H/O parotitis     right   . Polycythemia, secondary   . Transgendered   . Sleep apnea   . Neck pain   . Abnormal weight loss   . Gluten enteropathy   . Syrinx of spinal cord 01/06/2014 on MRI    c spine  . Anxiety     PTSD per patient   BP 116/60 mmHg  Pulse 79  SpO2 98%  Opioid Risk Score:   Fall Risk Score: Moderate Fall  Risk (6-13 points)`1  Depression screen PHQ 2/9  Depression screen PHQ 2/9 06/12/2014  Decreased Interest 2  Down, Depressed, Hopeless 0  PHQ - 2 Score 2  Altered sleeping 3  Tired, decreased energy 3  Change in appetite 0  Feeling bad or failure about yourself  0  Trouble concentrating 3  Moving slowly or fidgety/restless 0  Suicidal thoughts 0  PHQ-9 Score 11      Review of Systems  Musculoskeletal: Positive for gait problem.  Neurological:       Spasms   Psychiatric/Behavioral: The patient is nervous/anxious.        Objective:   Physical Exam  General: Alert and oriented x 3, No apparent distress  HEENT: Head is normocephalic, atraumatic, PERRLA, EOMI, sclera anicteric, oral mucosa pink and moist, dentition intact, ext ear canals clear,  Neck: Supple without JVD or lymphadenopathy  Heart: Reg rate and rhythm. No murmurs rubs or gallops  Chest: CTA bilaterally without wheezes, rales, or rhonchi; no distress  Abdomen: Soft, non-tender, non-distended, bowel sounds positive.  Extremities: No clubbing, cyanosis, or edema. Pulses are 2+  Skin: Clean and intact without signs of breakdown. Left TKA scar. Neuro: Pt is pleasant and alert and oriented x 3. He stutters and has word finding deficits. He is able to converse intelligently and has good insight and awareness. His short term memory is functional on a conversational level. Mild right facial weakness and sensory loss is appreciated. UES: 4+ to 5/5 LUE with some give away at the shoulder. RUE: 4- to 4 deltoid, bicep, tricep, wrist. Grip is weaker bilaterally due to pain. RLE: 3+ hf, 4- ke and ankle df. LE: 4+/5. Sensory loss 1+/2 Right face, hand, and leg. He is wearing his right AFO. Musculoskeletal: He has pain with IR left shoulder.  long head biceps tendon tender to touch. Speeds test +. He has weaker grip due to pain in both hands. No joint deformities appreciated in hands. Left knee lacks 15 flexion and 10 extension.  .Right lumbar paraspinals are tight and more tender with palpation. His low back is slightly rotated clockwise at baseline. Head position is forward and somewhat slumped. He has tenderness along left common extensor tendon of the elbow. Psych: Pt's affect is appropriate. Pt is cooperative   Assessment & Plan:   1. Psoriatic arthritis with pain in multiple areas, most prominently feet, hands, elbows.  2. Prior left sided CVA ('s) most substantial of which in May 2015 with residual right sided  weakness, sensory loss, and expressive language deficits.  3. Patello-femoral arthritis left knee  4. Chronic low back pain---likely related to altered gait pattern from CVATestosterone deficiency 5. Protein C deficiency  6. Left shoulder subluxation, bicipital tendonitis  7. Central sleep apnea  8. Polycythemia  9. Depression with anxiety 10. Left lateral epidondylitis    Plan:  1. Ms Contin 45m q8 was refilled #90. Will refill the oxycodone for breakthrough pain, Q8 prn #60. Second RF's were given for next month. Goal is to wean oxycodone 2. Consider referral to neuro-rehab to address gait technique and balance which should help some of his secondary pain problems as well ---will wait for him to finish left shoulder rehab.  3. Continue voltaren gel to hands, feet, knees, elbows if ok with H/O  4. Ice, extensor band, voltaren gel, rest to left elbow, and education was provided  5. After informed consent and preparation of the skin with betadine and isopropyl alcohol, I injected 624m(1cc) of celestone and 4cc of 1% lidocaine around the left long head biceps tendon via anterior approach. Additionally, aspiration was performed prior to injection. The patient tolerated well, and no complications were encountered. Afterward the area was cleaned and dressed. Post- injection instructions were provided.  6. Follow up with me or NP in about a month. 25 minutes of face to face patient care time were spent during  this visit. All questions were encouraged and answered.  I advised Co[place]ito contact me about any needs he has for a letter to his lawyers regarding his state. I had deferred the extensive assessment request to an FCE which would be a more objective assessment of his functional capacity.

## 2014-08-22 NOTE — Patient Instructions (Addendum)
VOLTAREN GEL, ICE, EXTENSOR BAND FOR RIGHT ELBOW.  CPM MACHINE FOR USE AFTER YOUR KNEE MANIPULATION  CONTACT ME WITH YOUR SPECIFIC "LETTER" NEEDS    Lateral Epicondylitis (Tennis Elbow) with Rehab Lateral epicondylitis involves inflammation and pain around the outer portion of the elbow. The pain is caused by inflammation of the tendons in the forearm that bring back (extend) the wrist. Lateral epicondylitis is also called tennis elbow, because it is very common in tennis players. However, it may occur in any individual who extends the wrist repetitively. If lateral epicondylitis is left untreated, it may become a chronic problem. SYMPTOMS   Pain, tenderness, and inflammation on the outer (lateral) side of the elbow.  Pain or weakness with gripping activities.  Pain that increases with wrist-twisting motions (playing tennis, using a screwdriver, opening a door or a jar).  Pain with lifting objects, including a coffee cup. CAUSES  Lateral epicondylitis is caused by inflammation of the tendons that extend the wrist. Causes of injury may include:  Repetitive stress and strain on the muscles and tendons that extend the wrist.  Sudden change in activity level or intensity.  Incorrect grip in racquet sports.  Incorrect grip size of racquet (often too large).  Incorrect hitting position or technique (usually backhand, leading with the elbow).  Using a racket that is too heavy. RISK INCREASES WITH:  Sports or occupations that require repetitive and/or strenuous forearm and wrist movements (tennis, squash, racquetball, carpentry).  Poor wrist and forearm strength and flexibility.  Failure to warm up properly before activity.  Resuming activity before healing, rehabilitation, and conditioning are complete. PREVENTION   Warm up and stretch properly before activity.  Maintain physical fitness:  Strength, flexibility, and endurance.  Cardiovascular fitness.  Wear and use  properly fitted equipment.  Learn and use proper technique and have a coach correct improper technique.  Wear a tennis elbow (counterforce) brace. PROGNOSIS  The course of this condition depends on the degree of the injury. If treated properly, acute cases (symptoms lasting less than 4 weeks) are often resolved in 2 to 6 weeks. Chronic (longer lasting cases) often resolve in 3 to 6 months but may require physical therapy. RELATED COMPLICATIONS   Frequently recurring symptoms, resulting in a chronic problem. Properly treating the problem the first time decreases frequency of recurrence.  Chronic inflammation, scarring tendon degeneration, and partial tendon tear, requiring surgery.  Delayed healing or resolution of symptoms. TREATMENT  Treatment first involves the use of ice and medicine to reduce pain and inflammation. Strengthening and stretching exercises may help reduce discomfort if performed regularly. These exercises may be performed at home if the condition is an acute injury. Chronic cases may require a referral to a physical therapist for evaluation and treatment. Your caregiver may advise a corticosteroid injection to help reduce inflammation. Rarely, surgery is needed. MEDICATION  If pain medicine is needed, nonsteroidal anti-inflammatory medicines (aspirin and ibuprofen), or other minor pain relievers (acetaminophen), are often advised.  Do not take pain medicine for 7 days before surgery.  Prescription pain relievers may be given, if your caregiver thinks they are needed. Use only as directed and only as much as you need.  Corticosteroid injections may be recommended. These injections should be reserved only for the most severe cases, because they can only be given a certain number of times. HEAT AND COLD  Cold treatment (icing) should be applied for 10 to 15 minutes every 2 to 3 hours for inflammation and pain, and immediately after  activity that aggravates your symptoms. Use  ice packs or an ice massage.  Heat treatment may be used before performing stretching and strengthening activities prescribed by your caregiver, physical therapist, or athletic trainer. Use a heat pack or a warm water soak. SEEK MEDICAL CARE IF: Symptoms get worse or do not improve in 2 weeks, despite treatment. EXERCISES  RANGE OF MOTION (ROM) AND STRETCHING EXERCISES - Epicondylitis, Lateral (Tennis Elbow) These exercises may help you when beginning to rehabilitate your injury. Your symptoms may go away with or without further involvement from your physician, physical therapist, or athletic trainer. While completing these exercises, remember:   Restoring tissue flexibility helps normal motion to return to the joints. This allows healthier, less painful movement and activity.  An effective stretch should be held for at least 30 seconds.  A stretch should never be painful. You should only feel a gentle lengthening or release in the stretched tissue. RANGE OF MOTION - Wrist Flexion, Active-Assisted  Extend your right / left elbow with your fingers pointing down.*  Gently pull the back of your hand towards you, until you feel a gentle stretch on the top of your forearm.  Hold this position for __________ seconds. Repeat __________ times. Complete this exercise __________ times per day.  *If directed by your physician, physical therapist or athletic trainer, complete this stretch with your elbow bent, rather than extended. RANGE OF MOTION - Wrist Extension, Active-Assisted  Extend your right / left elbow and turn your palm upwards.*  Gently pull your palm and fingertips back, so your wrist extends and your fingers point more toward the ground.  You should feel a gentle stretch on the inside of your forearm.  Hold this position for __________ seconds. Repeat __________ times. Complete this exercise __________ times per day. *If directed by your physician, physical therapist or athletic  trainer, complete this stretch with your elbow bent, rather than extended. STRETCH - Wrist Flexion  Place the back of your right / left hand on a tabletop, leaving your elbow slightly bent. Your fingers should point away from your body.  Gently press the back of your hand down onto the table by straightening your elbow. You should feel a stretch on the top of your forearm.  Hold this position for __________ seconds. Repeat __________ times. Complete this stretch __________ times per day.  STRETCH - Wrist Extension   Place your right / left fingertips on a tabletop, leaving your elbow slightly bent. Your fingers should point backwards.  Gently press your fingers and palm down onto the table by straightening your elbow. You should feel a stretch on the inside of your forearm.  Hold this position for __________ seconds. Repeat __________ times. Complete this stretch __________ times per day.  STRENGTHENING EXERCISES - Epicondylitis, Lateral (Tennis Elbow) These exercises may help you when beginning to rehabilitate your injury. They may resolve your symptoms with or without further involvement from your physician, physical therapist, or athletic trainer. While completing these exercises, remember:   Muscles can gain both the endurance and the strength needed for everyday activities through controlled exercises.  Complete these exercises as instructed by your physician, physical therapist or athletic trainer. Increase the resistance and repetitions only as guided.  You may experience muscle soreness or fatigue, but the pain or discomfort you are trying to eliminate should never worsen during these exercises. If this pain does get worse, stop and make sure you are following the directions exactly. If the pain is still present  after adjustments, discontinue the exercise until you can discuss the trouble with your caregiver. STRENGTH - Wrist Flexors  Sit with your right / left forearm palm-up and  fully supported on a table or countertop. Your elbow should be resting below the height of your shoulder. Allow your wrist to extend over the edge of the surface.  Loosely holding a __________ weight, or a piece of rubber exercise band or tubing, slowly curl your hand up toward your forearm.  Hold this position for __________ seconds. Slowly lower the wrist back to the starting position in a controlled manner. Repeat __________ times. Complete this exercise __________ times per day.  STRENGTH - Wrist Extensors  Sit with your right / left forearm palm-down and fully supported on a table or countertop. Your elbow should be resting below the height of your shoulder. Allow your wrist to extend over the edge of the surface.  Loosely holding a __________ weight, or a piece of rubber exercise band or tubing, slowly curl your hand up toward your forearm.  Hold this position for __________ seconds. Slowly lower the wrist back to the starting position in a controlled manner. Repeat __________ times. Complete this exercise __________ times per day.  STRENGTH - Ulnar Deviators  Stand with a ____________________ weight in your right / left hand, or sit while holding a rubber exercise band or tubing, with your healthy arm supported on a table or countertop.  Move your wrist, so that your pinkie travels toward your forearm and your thumb moves away from your forearm.  Hold this position for __________ seconds and then slowly lower the wrist back to the starting position. Repeat __________ times. Complete this exercise __________ times per day STRENGTH - Radial Deviators  Stand with a ____________________ weight in your right / left hand, or sit while holding a rubber exercise band or tubing, with your injured arm supported on a table or countertop.  Raise your hand upward in front of you or pull up on the rubber tubing.  Hold this position for __________ seconds and then slowly lower the wrist back to  the starting position. Repeat __________ times. Complete this exercise __________ times per day. STRENGTH - Forearm Supinators   Sit with your right / left forearm supported on a table, keeping your elbow below shoulder height. Rest your hand over the edge, palm down.  Gently grip a hammer or a soup ladle.  Without moving your elbow, slowly turn your palm and hand upward to a "thumbs-up" position.  Hold this position for __________ seconds. Slowly return to the starting position. Repeat __________ times. Complete this exercise __________ times per day.  STRENGTH - Forearm Pronators   Sit with your right / left forearm supported on a table, keeping your elbow below shoulder height. Rest your hand over the edge, palm up.  Gently grip a hammer or a soup ladle.  Without moving your elbow, slowly turn your palm and hand upward to a "thumbs-up" position.  Hold this position for __________ seconds. Slowly return to the starting position. Repeat __________ times. Complete this exercise __________ times per day.  STRENGTH - Grip  Grasp a tennis ball, a dense sponge, or a large, rolled sock in your hand.  Squeeze as hard as you can, without increasing any pain.  Hold this position for __________ seconds. Release your grip slowly. Repeat __________ times. Complete this exercise __________ times per day.  STRENGTH - Elbow Extensors, Isometric  Stand or sit upright, on a firm surface.  Place your right / left arm so that your palm faces your stomach, and it is at the height of your waist.  Place your opposite hand on the underside of your forearm. Gently push up as your right / left arm resists. Push as hard as you can with both arms, without causing any pain or movement at your right / left elbow. Hold this stationary position for __________ seconds. Gradually release the tension in both arms. Allow your muscles to relax completely before repeating. Document Released: 02/17/2005 Document  Revised: 07/04/2013 Document Reviewed: 06/01/2008 [facility] Patient Information 2015 [place], [place]. This information is not intended to replace advice given to you by your health care provider. Make sure you discuss any questions you have with your health care provider.

## 2014-08-24 ENCOUNTER — Ambulatory Visit (INDEPENDENT_AMBULATORY_CARE_PROVIDER_SITE_OTHER): Payer: BLUE CROSS/BLUE SHIELD | Admitting: Psychology

## 2014-08-24 DIAGNOSIS — F4310 Post-traumatic stress disorder, unspecified: Secondary | ICD-10-CM | POA: Diagnosis not present

## 2014-08-29 ENCOUNTER — Encounter: Payer: Self-pay | Admitting: Physical Medicine & Rehabilitation

## 2014-08-31 ENCOUNTER — Ambulatory Visit (INDEPENDENT_AMBULATORY_CARE_PROVIDER_SITE_OTHER): Payer: BLUE CROSS/BLUE SHIELD | Admitting: Psychology

## 2014-08-31 DIAGNOSIS — F4310 Post-traumatic stress disorder, unspecified: Secondary | ICD-10-CM | POA: Diagnosis not present

## 2014-09-03 ENCOUNTER — Encounter: Payer: Self-pay | Admitting: Hematology

## 2014-09-05 ENCOUNTER — Ambulatory Visit (INDEPENDENT_AMBULATORY_CARE_PROVIDER_SITE_OTHER): Payer: BLUE CROSS/BLUE SHIELD | Admitting: Psychology

## 2014-09-05 DIAGNOSIS — F4310 Post-traumatic stress disorder, unspecified: Secondary | ICD-10-CM

## 2014-09-07 ENCOUNTER — Ambulatory Visit (INDEPENDENT_AMBULATORY_CARE_PROVIDER_SITE_OTHER): Payer: BLUE CROSS/BLUE SHIELD | Admitting: Psychology

## 2014-09-07 DIAGNOSIS — F4310 Post-traumatic stress disorder, unspecified: Secondary | ICD-10-CM

## 2014-09-10 ENCOUNTER — Other Ambulatory Visit: Payer: Self-pay | Admitting: Adult Health

## 2014-09-11 ENCOUNTER — Encounter: Payer: Self-pay | Admitting: Hematology

## 2014-09-11 ENCOUNTER — Other Ambulatory Visit: Payer: Self-pay | Admitting: *Deleted

## 2014-09-11 DIAGNOSIS — I82409 Acute embolism and thrombosis of unspecified deep veins of unspecified lower extremity: Secondary | ICD-10-CM

## 2014-09-11 DIAGNOSIS — D751 Secondary polycythemia: Secondary | ICD-10-CM

## 2014-09-11 DIAGNOSIS — Z862 Personal history of diseases of the blood and blood-forming organs and certain disorders involving the immune mechanism: Secondary | ICD-10-CM

## 2014-09-11 MED ORDER — RIVAROXABAN 20 MG PO TABS: 20.0000 mg | ORAL_TABLET | Freq: Every day | ORAL | Status: DC

## 2014-09-12 ENCOUNTER — Ambulatory Visit: Payer: BLUE CROSS/BLUE SHIELD | Admitting: Psychology

## 2014-09-14 ENCOUNTER — Ambulatory Visit (INDEPENDENT_AMBULATORY_CARE_PROVIDER_SITE_OTHER): Payer: BLUE CROSS/BLUE SHIELD | Admitting: Psychology

## 2014-09-14 ENCOUNTER — Encounter: Payer: Self-pay | Admitting: Hematology

## 2014-09-14 ENCOUNTER — Telehealth: Payer: Self-pay | Admitting: Hematology

## 2014-09-14 ENCOUNTER — Telehealth: Payer: Self-pay | Admitting: *Deleted

## 2014-09-14 ENCOUNTER — Other Ambulatory Visit (HOSPITAL_BASED_OUTPATIENT_CLINIC_OR_DEPARTMENT_OTHER): Payer: BLUE CROSS/BLUE SHIELD

## 2014-09-14 ENCOUNTER — Ambulatory Visit (HOSPITAL_BASED_OUTPATIENT_CLINIC_OR_DEPARTMENT_OTHER): Payer: BLUE CROSS/BLUE SHIELD | Admitting: Hematology

## 2014-09-14 VITALS — BP 124/78 | HR 115 | Temp 98.7°F | Resp 17 | Ht 68.0 in | Wt 210.3 lb

## 2014-09-14 DIAGNOSIS — I82401 Acute embolism and thrombosis of unspecified deep veins of right lower extremity: Secondary | ICD-10-CM

## 2014-09-14 DIAGNOSIS — Z8673 Personal history of transient ischemic attack (TIA), and cerebral infarction without residual deficits: Secondary | ICD-10-CM | POA: Diagnosis not present

## 2014-09-14 DIAGNOSIS — D751 Secondary polycythemia: Secondary | ICD-10-CM

## 2014-09-14 DIAGNOSIS — F4310 Post-traumatic stress disorder, unspecified: Secondary | ICD-10-CM | POA: Diagnosis not present

## 2014-09-14 DIAGNOSIS — Z86718 Personal history of other venous thrombosis and embolism: Secondary | ICD-10-CM

## 2014-09-14 DIAGNOSIS — D6859 Other primary thrombophilia: Secondary | ICD-10-CM

## 2014-09-14 LAB — CBC WITH DIFFERENTIAL/PLATELET
BASO%: 0.1 % (ref 0.0–2.0)
Basophils Absolute: 0 10*3/uL (ref 0.0–0.1)
EOS%: 0 % (ref 0.0–7.0)
Eosinophils Absolute: 0 10*3/uL (ref 0.0–0.5)
HCT: 37.7 % — ABNORMAL LOW (ref 38.4–49.9)
HGB: 11.9 g/dL — ABNORMAL LOW (ref 13.0–17.1)
LYMPH%: 41.9 % (ref 14.0–49.0)
MCH: 25.2 pg — ABNORMAL LOW (ref 27.2–33.4)
MCHC: 31.6 g/dL — ABNORMAL LOW (ref 32.0–36.0)
MCV: 79.7 fL (ref 79.3–98.0)
MONO#: 0.3 10*3/uL (ref 0.1–0.9)
MONO%: 10.9 % (ref 0.0–14.0)
NEUT#: 1.5 10*3/uL (ref 1.5–6.5)
NEUT%: 47.1 % (ref 39.0–75.0)
PLATELETS: 205 10*3/uL (ref 140–400)
RBC: 4.73 10*6/uL (ref 4.20–5.82)
RDW: 15.8 % — ABNORMAL HIGH (ref 11.0–14.6)
WBC: 3.1 10*3/uL — ABNORMAL LOW (ref 4.0–10.3)
lymph#: 1.3 10*3/uL (ref 0.9–3.3)

## 2014-09-14 NOTE — Progress Notes (Signed)
[place]EMATOLOGY FOLLOW UP NOTE DATE OF VISIT: 09/14/2014   Patient Care Team: Concepcion Elk, MD as PCP - General (Internal Medicine) Bernadene Bell, MD as Consulting Physician (Hematology) Aretta Nip, MD as Referring Physician (Family Medicine)  CHIEF COMPLAINTS Follow up DVT and polycythemia  HISTORY OF INITIAL PRESENTING ILLNESS (from Dr. Renella Cunas note):   Jesse Bowers 49 y.o. 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 Patient is a former Software engineer. Patient had a fractured foot repair in 2007 and caused a right leg DVT. At the time he took Coumadin for 3 months.   In May of this year 2015 patient presented with right-sided weakness and slurring of speech, he felt tired, had some nausea and was having trouble gripping the soap when he was in the shower. He checked himself in the mirror there was no facial droop but based on the symptoms he texted his wife took him to the hospital where he was kept overnight. His symptoms of TIA lasted for more than 24 hours and he had appropriate neuro imaging done. When he was seen by a neurologist later, he was told that he had a left frontal stroke. This happened at a young age of 63 so his primary care physician ordered hypercoagulable panel and that showed a protein C deficiency. This TIA or stroke event took place on 07/27/2013 an MRI brain from that date showed changes of early chronic small vessel ischemic changes. A note from the previous hematologist Dr Laury Axon in [facility] indicated that at [facility] they also felt that it was not a stroke. Carotid ultrasound showed minimal atherosclerotic plaques. The hypercoagulable panel was drawn on 08/19/2013 and he was negative for factor V Leiden mutation, prothrombin gene mutation. He had normal levels of antithrombin III and protein S but the protein C level was decreased at 47%(reference range is 70-180%).  A repeat protein C activity done on 09/28/2013 was 58% still low. Lupus anticoagulant was negative. Beta-2 glycoprotein antibodies were negative.  Patient is currently receiving a baby aspirin for last 3 months. The hematologist in [place] Dr. Alain Marion also gave him some Lovenox injections for prophylactic use just before a long distance travel. He encouraged him to avoid inactivity/immobility. Patient was not given an anticoagulant such as warfarin or the new generation drugs like Xarelto or Eliquis.  On 04/28/2013 patient's CBC showed a white count of 6.2 hemoglobin 18.7 hematocrit 53.1 MCV 92 platelet count of 207. Differential was normal. Chemistry panel was normal creatinine 1.23. Patient does have history of elevated liver enzymes in the past and the etiology of that is not clear likely medication related. He had liver biopsy done in 2013 which was reportedly normal. Patient was taking Remicade for over an year and it was helping his skin lesions of psoriasis but not the joint pain. It was switched to Enbrel which he is still taking a subcutaneous shot once a week that is every Sunday.  Patient also have some pain control issues and was seeing a pain specialist in [place]. He is in the process of getting a referral to a pain specialist here. His pain medications include morphine extended release, oxycodone, occasional naproxen and methadone. Patient is also on antidepressants.  Patient also had a clot at PICC line for antibiotics in 2006. Patient had history of psoriasis and psoriatic arthritis being managed by a rheumatologist in [place]. Now he is shifting his care to a local rheumatologist here.  Most of his psoriasis involves the bottom of the feet mostly on the left side, hands, knees and ears.  Patient also had a history of right-sided parotitis documented in May 2015 that was treated with antibiotics. He does have chronic dry mouth because of sleep apnea as well as his antidepressant  therapy.  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 That itself can increase the risk for both arterial as well as venous thromboembolic events. 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 For his depression patient was on Prozac in March 2015 but it was causing decreased sexual libido and changed to Wellbutrin in March 2015. He does have mild constipation with the use of narcotics and takes Colace twice a day.  Patient is also complaining of 50 pound weight loss in the last 3-4 months. He thinks it is unintentional. He has been on immunosuppressant medication such as Remicade and Enbrel and sometimes those can be associated with possibility of non-Hodgkin lymphomas. He also have chronic pain and recently saw an orthopedic physician who recommended MRI of the neck which will be scheduled. He is also seeing a rheumatologist next month, a neurologist and a pain specialist. He does complain of left shoulder pain and left elbow pain. There was some discussion by previous rheumatologist in [place] about switching his biological therapy boost to Stelara or [place] Nail once he is evaluated by a rheumatologist. Patient had already received his flu shot as well as pneumonia shot. He also complains of having significant short-term memory problems and trouble concentrating which could be from his medications or TIA episode.  So patient's active hematologic problems are  #1 Protein C deficiency documented twice. #2 TIA/CVA in May 2015 #3 history of DVT in 2007 Right leg treated with Coumadin x 3 months. It was provoked by foot injury #3 Use of immunosuppressant medications which increase the likelihood of lymphomas. 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   INTERVAL HISTORY: Jesse Bowers returns for  follow-up. He underwent second left knee surgery a few months ago, for adhesion lysis. She was on baby aspirin postop for week, and restarted Xarelto afterwards. He did have significant bleeding in the knee when he restarted Xarelto right after his first E replacement surgery. He had been taking Xarelto once daily. He notices some bleeding in his right eyes 2 weeks ago, his vision was fine, he did not see an ophthalmologist, and the bleeding is gradually cleaning up. No other signs of bleeding. He still uses a walker, still on physical therapy. No other new complaints.  MEDICAL HISTORY:   Past Medical History  Diagnosis Date  . psoriatic arthritis   . H/O protein C deficiency   . Hx-TIA (transient ischemic attack)   . Clotting disorder   . Celiac disease   . H/O parotitis     right   . Polycythemia, secondary   . Transgendered   . Sleep apnea   . Neck pain   . Abnormal weight loss   . Gluten enteropathy   . Syrinx of spinal cord 01/06/2014 on MRI    c spine  . Anxiety     PTSD per patient    SURGICAL HISTORY:  Past Surgical History  Procedure Laterality Date  . Thyroidectomy, partial  2008  . Cholecystectomy    . Ankle arthroscopy with reconstruction  Right 2007  . Knee surgery Bilateral 1984    Right ACL, left PCL repair  . Liver biopsy  2013    normal results.  . Hip arthroscopy w/ labral repair Right 05/11/2013    SOCIAL HISTORY:  History   Social History  . Marital Status: Married    Spouse Name: N/A  . Number of Children: 2  . 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 Smoking status: Never Smoker   . Smokeless tobacco: Not on file  . Alcohol Use: Yes     Comment: wine once a month  . Drug Use: No  . Sexual Activity: Yes    Birth Control/ Protection: None     Comment: patient is a transgender on testosterone shots, no biological kids   Other Topics  Concern  . Not on file   Social History Narrative   Education 4 year college, former Therapist, sports X 15 years, pediatric nurse practitioner x 6 years, did NP degree from [place] [place]. Relocated to [place] about 2 months ago from [place], [place]. Patient was in [place] for last 4 years and prior to that in [place]. His wife is working as Scientist, research (physical sciences) for [employer]. Patient is not working and applying for disability. They have 2 kids but no biologic children.     FAMILY HISTORY:  Family History  Problem Relation Age of Onset  . Stroke Paternal Uncle     age 6  . Stroke Maternal Grandfather     7  . Hypertension Mother   . Psoriasis Mother   . Stroke Maternal Grandmother   . Congestive Heart Failure Maternal Grandmother   . Cancer Paternal Grandfather   . Protein C deficiency Sister 75    Miscarriages    ALLERGIES:  is allergic to penicillin g; vancomycin; duloxetine; gabapentin; and sulfa antibiotics.  MEDICATIONS:  Current Outpatient Prescriptions  Medication Sig Dispense Refill  . clorazepate (TRANXENE) 7.5 MG tablet Take one tablet by mouth three times daily (Patient taking differently: Take 7.5 mg by mouth 4 (four) times daily. Take one tablet by mouth three times daily) 90 tablet 0  . cyclobenzaprine (FLEXERIL) 5 MG tablet Take 5 mg by mouth at bedtime.    . diclofenac sodium (VOLTAREN) 1 % GEL Apply 1 application topically 3 (three) times daily. To feet, hands, knees, elbows 3 Tube 4  . morphine (MS CONTIN) 30 MG 12 hr tablet Take 1 tablet (30 mg total) by mouth every 8 (eight) hours. 90 tablet 0  . Oxycodone HCl 10 MG TABS Take 1 tablet (10 mg total) by mouth every 8 (eight) hours as needed. 60 tablet 0  . polyethylene glycol (MIRALAX / GLYCOLAX) packet Take 17 g by mouth 2 (two) times daily.    . prazosin (MINIPRESS) 1 MG capsule Take 2 mg by mouth at bedtime.     . rivaroxaban (XARELTO) 20 MG TABS tablet Take 1 tablet (20 mg total) by mouth daily with supper. 30 tablet 3   . testosterone cypionate (DEPOTESTOTERONE CYPIONATE) 200 MG/ML injection Inject 100 mg into the muscle.     No current facility-administered medications for this visit.    REVIEW OF SYSTEMS:   Constitutional: Denies fevers, chills or abnormal night sweats Eyes: Denies blurriness of vision, double vision or watery eyes Ears, nose, mouth, throat, and face: Denies mucositis or sore throat, +Dry mouth  Respiratory: Denies cough, dyspnea or wheezes Cardiovascular: Denies palpitation, chest discomfort or lower extremity swelling Gastrointestinal:  Denies nausea, heartburn, + change in bowel habits that is Constipation Skin: Hx of psoriasis and arthropathy from it.  Lymphatics: Denies new lymphadenopathy or easy bruising, hx of right parotitis twice. Neurological:Denies numbness, tingling or new weaknesses, recent TIA March 2015 Behavioral/Psych: Mood is stable, no new changes, +depression and anxiety, +memory problems All other systems were reviewed with the patient and are negative.  PHYSICAL EXAMINATION: ECOG PERFORMANCE STATUS: 1  Filed Vitals:   09/14/14 1052  BP: 124/78  Pulse: 115  Temp: 98.7 F (37.1 C)  Resp: 17   GENERAL:alert, no distress and comfortable SKIN: skin color, texture, turgor are normal, no rashes or significant lesions EYES: normal, conjunctiva are pink and non-injected, sclera clear except small bleeding in the inner of right sclera  OROPHARYNX:no exudate, no erythema and lips, buccal mucosa, and tongue normal  NECK: supple, thyroid normal size, non-tender, without nodularity LYMPH:  no palpable lymphadenopathy in the cervical, axillary or inguinal LUNGS: clear to auscultation and percussion with normal breathing effort HEART: regular rate & rhythm and no murmurs and no lower extremity edema ABDOMEN:abdomen soft, non-tender and normal bowel sounds Musculoskeletal:no cyanosis of digits and no clubbing  PSYCH: alert & oriented x 3 NEURO: no focal motor/sensory  deficits EXT: The surgical scar at the left knee Is healing well, no discharge or skin erythema. (+) Pitting edema up to left thigh, and a resolving ecchymosis around the knee.  LABORATORY DATA:  I have reviewed the data as listed CBC Latest Ref Rng 09/14/2014 07/20/2014 06/07/2014  WBC 4.0 - 10.3 10e3/uL 3.1(L) 3.7(L) 3.7(L)  Hemoglobin 13.0 - 17.1 g/dL 11.9(L) 10.3(L) 13.6  Hematocrit 38.4 - 49.9 % 37.7(L) 32.2(L) 42.5  Platelets 140 - 400 10e3/uL 205 458(H) 171   Erythropoietin  Status: Finalresult Visible to patient:  MyChart Nextappt: Today at 10:15 AM in [facility], Krista Blue, [place]) Dx:  Polycythemia, secondary         Ref Range 2wk ago    Erythropoietin 2.6 - 18.5 mIU/mL 17.0         Pathology report Diagnosis Bone Marrow, Aspirate,Biopsy, and Clot, right iliac - SLIGHTLY HYPERCELLULAR BONE MARROW FOR AGE WITH TRILINEAGE HEMATOPOIESIS. - SEE COMMENT. PERIPHERAL BLOOD: - NO SIGNIFICANT MORPHOLOGIC ABNORMALITIES. Diagnosis Note The bone marrow is slightly hypercellular with trilineage hematopoiesis and essentially orderly and progressive maturation of all cell types. Significant dyspoiesis is not present. Iron stores are markedly decreased to absent and hence correlation with serum iron studies is recommended. There is no morphologic evidence of a myeloproliferative process in this material. Correlation with cytogenetic studies is recommended. (BNS:ecj 01-May-2014)   RADIOGRAPHIC STUDIES: No results found.  ASSESSMENT & PLAN:   1. 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 He has slightly hypercellular bone marrow with trilineage hematopoiesis.  No morphology evidence of myeloproliferative process. Cytogenetics was normal (no Y chromosome).  JAK2 mutation was negative. -He had Hemoglobin 18.7 hematocrit 53.1 in 04/2013. His able level is normal. His presentation  is consistent with secondary polycythemia, likely secondary to testosterone injections. 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 He does have hot flash and other symptoms without testosterone injection. I recommend him to maintain his testosterone level at low normal limits, and he agrees.  -We discussed that secondary polycythemia is  usually benign, risk of thrombosis is relatively low than PV. However he did have history of stroke, and he feels fatigued when his hemoglobin is high. He agrees to continue phlebotomy as needed. -He developed anemia after the surgery secondary to blood loss, improved, hemoglobin 11.9 today.  -We'll repeat CBC in 3 months   2. History of DVT and stroke, Protein C deficiency  - continue Xarelto indefinitely given his underlying protein C deficiency and history of DVT and stroke.  -Given his recent episode of scleral bleeding, I instructed him to watch bleeding carefully. If he has recurrent bleeding, I may decrease his dose to 15 mg daily  3. History of CVA -His primary care physician is setting up a referral to a neurologist at [facility]. He will follow  4. 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  -We discussed that excessive testosterone would increase the risk of thrombosis and worsen his polycythemia. I prefer him to maintain low normal level of testosterone. He agrees.  5. Left knee replacement on 07/03/2014 -Continue rehabilitation and physical therapy -He may need additional surgery if he does not recover well.  6. Mild thrombocytosis -Likely reactive to his iron deficient anemia -Resolved now. Continue monitoring.   Plan -Hold phlebotomy for the next 3 months due to his anemia after surgery -Return to clinic with lab in 3 months -If he has more  episodes of significant bleeding, I told him to stop Xarelto, and restart at [address] daily  All questions were answered. The patient knows to call the clinic with any problems, questions or concerns.  I spent 20 minutes counseling the patient face to face. The total time spent in the appointment was 25 minutes and more than 50% was on counseling.   [name]  09/14/2014

## 2014-09-14 NOTE — Telephone Encounter (Signed)
per pof to sch pt appt-gave pt copy of avs °

## 2014-09-14 NOTE — Telephone Encounter (Signed)
Patient called and stated that his pain is greatly increased since his knee replacement, would like to get a refill of the Tramadol 50 mg TID. Is this OK to refill, please advise.

## 2014-09-14 NOTE — Telephone Encounter (Signed)
Called patient and told him that his disability paperwork will be ready by Monday, however the paperwork from [place] have any white out on it and we need new copies of that to release his medical records.  I told the patient I would call tomorrow to let him know about the tramadol. He verbalized understanding

## 2014-09-15 NOTE — Telephone Encounter (Signed)
Patient called and left message stating that [facility] will be faxing [place] medical records release form.

## 2014-09-19 ENCOUNTER — Ambulatory Visit (INDEPENDENT_AMBULATORY_CARE_PROVIDER_SITE_OTHER): Payer: BLUE CROSS/BLUE SHIELD | Admitting: Psychology

## 2014-09-19 DIAGNOSIS — F4310 Post-traumatic stress disorder, unspecified: Secondary | ICD-10-CM

## 2014-09-21 ENCOUNTER — Ambulatory Visit (INDEPENDENT_AMBULATORY_CARE_PROVIDER_SITE_OTHER): Payer: BLUE CROSS/BLUE SHIELD | Admitting: Psychology

## 2014-09-21 DIAGNOSIS — F4310 Post-traumatic stress disorder, unspecified: Secondary | ICD-10-CM

## 2014-09-22 ENCOUNTER — Telehealth: Payer: Self-pay | Admitting: *Deleted

## 2014-09-22 DIAGNOSIS — L4050 Arthropathic psoriasis, unspecified: Secondary | ICD-10-CM

## 2014-09-22 DIAGNOSIS — Z96652 Presence of left artificial knee joint: Secondary | ICD-10-CM

## 2014-09-22 DIAGNOSIS — M7712 Lateral epicondylitis, left elbow: Secondary | ICD-10-CM

## 2014-09-22 DIAGNOSIS — M7522 Bicipital tendinitis, left shoulder: Secondary | ICD-10-CM

## 2014-09-22 DIAGNOSIS — G8101 Flaccid hemiplegia affecting right dominant side: Secondary | ICD-10-CM

## 2014-09-22 MED ORDER — OXYCODONE HCL 10 MG PO TABS: 10.0000 mg | ORAL_TABLET | Freq: Three times a day (TID) | ORAL | Status: DC | PRN

## 2014-09-22 NOTE — Telephone Encounter (Signed)
Pt coming by on Monday to p/u script...Marland KitchenMarland KitchenI did my best to calmly explain that the script was written as every 8 hrs as needed for pain and that Dr. [name] is hoping the pt will manage the supply to last a month.  I added that the ultimate treatment plan goal is to wean off the oxycodone 10 mg tabs according to Dr. Charm Barges last clinic note

## 2014-09-22 NOTE — Telephone Encounter (Signed)
We can fill early this time, but again the idea was to decrease the use of this to about 2 per day on avg (he may take up to 3 per day if needed on a given day though)  Copied from my office note: 1. Ms Contin 35m q8 was refilled #90. Will refill the oxycodone for breakthrough pain, Q8 prn #60. Second RF's were given for next month. Goal is to wean oxycodone

## 2014-09-22 NOTE — Telephone Encounter (Signed)
Pt says he has been taking his oxycodone 10 mg 3 times a day. Sig states take 1 tab every 8 hours a day as needed for pain with a dispense count of #60.  He  Is stating that you are giving him a 20 day supply according to pill count and what the sig states. Would you like me to call him back and explain your notes/treatment plan....Marland Kitchenor would you like to call him personally?

## 2014-09-28 ENCOUNTER — Ambulatory Visit (INDEPENDENT_AMBULATORY_CARE_PROVIDER_SITE_OTHER): Payer: BLUE CROSS/BLUE SHIELD | Admitting: Psychology

## 2014-09-28 DIAGNOSIS — F4310 Post-traumatic stress disorder, unspecified: Secondary | ICD-10-CM | POA: Diagnosis not present

## 2014-10-03 ENCOUNTER — Encounter: Payer: Self-pay | Admitting: Hematology

## 2014-10-03 ENCOUNTER — Ambulatory Visit (INDEPENDENT_AMBULATORY_CARE_PROVIDER_SITE_OTHER): Payer: BLUE CROSS/BLUE SHIELD | Admitting: Psychology

## 2014-10-03 DIAGNOSIS — Z8673 Personal history of transient ischemic attack (TIA), and cerebral infarction without residual deficits: Secondary | ICD-10-CM

## 2014-10-03 DIAGNOSIS — F4310 Post-traumatic stress disorder, unspecified: Secondary | ICD-10-CM | POA: Diagnosis not present

## 2014-10-04 ENCOUNTER — Telehealth: Payer: Self-pay | Admitting: Hematology

## 2014-10-04 ENCOUNTER — Other Ambulatory Visit: Payer: Self-pay | Admitting: *Deleted

## 2014-10-04 NOTE — Telephone Encounter (Signed)
lvm for pt regarding to OCT appt...Marland KitchenMarland Kitchen

## 2014-10-05 ENCOUNTER — Ambulatory Visit: Payer: Self-pay | Admitting: Hematology

## 2014-10-05 ENCOUNTER — Other Ambulatory Visit: Payer: Self-pay

## 2014-10-05 ENCOUNTER — Ambulatory Visit (INDEPENDENT_AMBULATORY_CARE_PROVIDER_SITE_OTHER): Payer: BLUE CROSS/BLUE SHIELD | Admitting: Psychology

## 2014-10-05 DIAGNOSIS — F4310 Post-traumatic stress disorder, unspecified: Secondary | ICD-10-CM

## 2014-10-09 ENCOUNTER — Telehealth: Payer: Self-pay | Admitting: *Deleted

## 2014-10-09 NOTE — Telephone Encounter (Signed)
Jesse Bowers called because Dr [name] gave him 2 rx last visit with his meds.  On each Rx sheet there was a MS Contin and Oxycodone.  He got the July rx filled.  When he went to get the August, they filled the oxycodone but said they did not have the MS Contin RX.  He has called [place] for assistance.  I called [facility] and had them pull the hard copy and they do indeed have the Rx for MS Contin but they do not have enough to fill # 60.  They are going to give him the Rx and let him take it to another pharmacy.  I notified Jesse Bowers.

## 2014-10-10 ENCOUNTER — Ambulatory Visit (INDEPENDENT_AMBULATORY_CARE_PROVIDER_SITE_OTHER): Payer: BLUE CROSS/BLUE SHIELD | Admitting: Psychology

## 2014-10-10 DIAGNOSIS — F4310 Post-traumatic stress disorder, unspecified: Secondary | ICD-10-CM

## 2014-10-12 ENCOUNTER — Ambulatory Visit (INDEPENDENT_AMBULATORY_CARE_PROVIDER_SITE_OTHER): Payer: BLUE CROSS/BLUE SHIELD | Admitting: Psychology

## 2014-10-12 DIAGNOSIS — F4310 Post-traumatic stress disorder, unspecified: Secondary | ICD-10-CM | POA: Diagnosis not present

## 2014-10-13 ENCOUNTER — Ambulatory Visit: Payer: BLUE CROSS/BLUE SHIELD | Attending: Registered Nurse

## 2014-10-13 DIAGNOSIS — R2681 Unsteadiness on feet: Secondary | ICD-10-CM | POA: Diagnosis present

## 2014-10-13 DIAGNOSIS — R269 Unspecified abnormalities of gait and mobility: Secondary | ICD-10-CM | POA: Diagnosis not present

## 2014-10-13 DIAGNOSIS — M6281 Muscle weakness (generalized): Secondary | ICD-10-CM | POA: Insufficient documentation

## 2014-10-13 NOTE — Therapy (Signed)
[place], [place], [address] Phone: [phone]   Fax:  [phone]  Physical Therapy Evaluation  Patient Details  Name: Jesse Bowers MRN: [id number] Date of Birth: [birth year] Referring Provider:  Bayard Hugger, NP  Encounter Date: 10/13/2014      PT End of Session - 10/13/14 1153    Visit Number 1   Number of Visits 1   PT Start Time 0807   PT Stop Time 1125   PT Time Calculation (min) 198 min   Activity Tolerance Patient tolerated treatment well   Behavior During Therapy [facility] for tasks assessed/performed      Past Medical History  Diagnosis Date  . psoriatic arthritis   . H/O protein C deficiency   . Hx-TIA (transient ischemic attack)   . Clotting disorder   . Celiac disease   . H/O parotitis     right   . Polycythemia, secondary   . Transgendered   . Sleep apnea   . Neck pain   . Abnormal weight loss   . Gluten enteropathy   . Syrinx of spinal cord 01/06/2014 on MRI    c spine  . Anxiety     PTSD per patient    Past Surgical History  Procedure Laterality Date  . Thyroidectomy, partial  2008  . Cholecystectomy    . Ankle arthroscopy with reconstruction Right 2007  . Knee surgery Bilateral 1984    Right ACL, left PCL repair  . Liver biopsy  2013    normal results.  . Hip arthroscopy w/ labral repair Right 05/11/2013    There were no vitals filed for this visit.  Visit Diagnosis:  Abnormality of gait - Plan: PT plan of care cert/re-cert  Muscle weakness - Plan: PT plan of care cert/re-cert  Unsteadiness on feet - Plan: PT plan of care cert/re-cert          [facility] PT Assessment - 10/13/14 0818    Assessment   Medical Diagnosis CVA with RT sided weakness   Onset Date/Surgical Date --  08/2013   Next MD Visit 10/27/14   Precautions   Precautions Fall   Restrictions   Weight Bearing Restrictions No   Balance Screen   Has the patient fallen in the past 6 months Yes    How many times? 4  was not using walker going to bathroom at night   Functional Tests   Functional tests Other   Other:   Other/ Comments Jesse Bowers completed the FCE process with reating of ability to do Sedentary work.                            PT Education - 10/13/14 1153    Education provided Yes   Education Details Basic test results    Person(s) Educated Patient   Methods Explanation   Comprehension Verbalized understanding          PT Short Term Goals - 06/07/14 1411    PT SHORT TERM GOAL #1   Title Pt will be independent with HEP for improved strength, balance, and gait.   Time 4   Period Weeks   Status Achieved   PT SHORT TERM GOAL #2   Title Pt will improve Timed Up and Go score to less than or equal to 13.5 seconds for decreased fall risk.   Time 4   Period Weeks   Status Not Met  16.67 seconds  on 06/07/14   PT SHORT TERM GOAL #3   Title Pt will improve Dynamic Gait Index score to at least 13/24 for decreased fall risk.   Time 4   Period Weeks   Status Achieved  Scored 18/24 on 06/07/14   PT SHORT TERM GOAL #4   Title Pt will negotiate at least 4 steps with step-to pattern, modified independently for improved safety with stair negotiation.   Time 4   Period Weeks   Status Achieved   PT SHORT TERM GOAL #5   Title Pt will verbalize understanding of CVA education-warning signs and risk factors.   Time 4   Period Weeks   Status Achieved           PT Long Term Goals - 06/14/14 4098    PT LONG TERM GOAL #1   Title Pt will verbalize understanding of fall prevention within home environment.   Status Achieved   PT LONG TERM GOAL #2   Title Pt will perform sit<>stand at least 8 of 10 reps with minimal to no UE support, for improved transfer efficiency and safety.   Baseline Requires UE support for sit<>stand   Status Not Met   PT LONG TERM GOAL #3   Title Pt will improve Dynamic Gait Index score to at least 19/24 for decreased fall risk.    Baseline 18/24 on 06/09/14   Status Not Met   PT LONG TERM GOAL #4   Title Pt will improve gait velocity to at least 2.62 ft/sec for improved gait efficiency and safety.   Baseline 2.2 ft/sec 06/14/14   Status Not Met   PT LONG TERM GOAL #5   Title Pt will verbalize plans for continued community fitness upon D/C from PT.   Status Achieved               Plan - 10/13/14 1154    Clinical Impression Statement Jesse Bowers completed the FCE process with high pain levels in feet, knees , shoulders and hands. He was rated at Sedentary level for work   PT Next Visit Plan Discharge to [place]    Consulted and Agree with Plan of Care Patient         Problem List Patient Active Problem List   Diagnosis Date Noted  . Status post left knee replacement 08/22/2014  . Left lateral epicondylitis 08/22/2014  . Syringomyelia 04/10/2014  . Abnormal finding on MRI of brain 04/10/2014  . Chronic pain syndrome 04/10/2014  . Chronic fatigue 04/10/2014  . Numbness 04/10/2014  . OSA (obstructive sleep apnea) 04/10/2014  . Insomnia 04/10/2014  . Protein C deficiency 03/23/2014  . 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 Biceps tendonitis on left 03/01/2014  . Polycythemia, secondary 12/27/2013  . H/O TIA (transient ischemic attack) and stroke 12/27/2013  . Psoriatic arthritis 12/27/2013  . History of celiac disease 12/27/2013  . Weight loss, unintentional 12/27/2013  . H/O protein C deficiency 12/27/2013  . Transgendered 12/27/2013  . Sleep apnea 12/27/2013  . H/O parotitis 12/27/2013  . Neck pain 12/27/2013  . Depression with anxiety 12/27/2013  . DVT (deep venous thrombosis) 12/27/2013    Darrel Hoover PT 10/13/2014, 12:00 PM  [facility] [address] [place], [place], [address] Phone: [phone]   Fax:  [phone]

## 2014-10-13 NOTE — Patient Instructions (Signed)
Jesse Bowers was informed of the results of the tesing at Sedentary Work level

## 2014-10-16 NOTE — Telephone Encounter (Signed)
i made a referral for neuropsychological testing today.  Please let pt know---thanks

## 2014-10-24 ENCOUNTER — Ambulatory Visit (INDEPENDENT_AMBULATORY_CARE_PROVIDER_SITE_OTHER): Payer: BLUE CROSS/BLUE SHIELD | Admitting: Psychology

## 2014-10-24 DIAGNOSIS — F4310 Post-traumatic stress disorder, unspecified: Secondary | ICD-10-CM | POA: Diagnosis not present

## 2014-10-25 ENCOUNTER — Ambulatory Visit (INDEPENDENT_AMBULATORY_CARE_PROVIDER_SITE_OTHER): Payer: BLUE CROSS/BLUE SHIELD | Admitting: Endocrinology

## 2014-10-25 ENCOUNTER — Encounter: Payer: Self-pay | Admitting: Endocrinology

## 2014-10-25 VITALS — BP 126/83 | HR 68 | Temp 97.4°F | Ht 68.0 in | Wt 209.0 lb

## 2014-10-25 DIAGNOSIS — F640 Transsexualism: Secondary | ICD-10-CM

## 2014-10-25 DIAGNOSIS — F641 Gender identity disorder in adolescence and adulthood: Secondary | ICD-10-CM

## 2014-10-25 DIAGNOSIS — D751 Secondary polycythemia: Secondary | ICD-10-CM

## 2014-10-25 DIAGNOSIS — Z789 Other specified health status: Secondary | ICD-10-CM

## 2014-10-25 NOTE — Patient Instructions (Signed)
blood tests are requested for you today.  We'll let you know about the results. Please reduce the testosterone to 50 mg every week. Please come back for a follow-up appointment in 2-3 months.

## 2014-10-25 NOTE — Progress Notes (Signed)
Subjective:    Patient ID: Jesse Bowers, male    DOB: 1965/12/12, 49 y.o.   MRN: [id number]  HPI Pt is here for f/u of F to M transgender state.  He had breast reduction in approx 2001, and TSH/BSO in 1997.  He has moderate hair growth on the face, and assoc acne.  He has blood removed approx every 10-14 days.  He had a CVA in 2015, which was felt to be possibly related to the polycythemia (in addition to protein C deficiency).  He is prescribed testosterone 100 mg weekly, but often takes it a few days late.   Past Medical History  Diagnosis Date  . psoriatic arthritis   . H/O protein C deficiency   . Hx-TIA (transient ischemic attack)   . Clotting disorder   . Celiac disease   . H/O parotitis     right   . Polycythemia, secondary   . Transgendered   . Sleep apnea   . Neck pain   . Abnormal weight loss   . Gluten enteropathy   . Syrinx of spinal cord 01/06/2014 on MRI    c spine  . Anxiety     PTSD per patient    Past Surgical History  Procedure Laterality Date  . Thyroidectomy, partial  2008  . Cholecystectomy    . Ankle arthroscopy with reconstruction Right 2007  . Knee surgery Bilateral 1984    Right ACL, left PCL repair  . Liver biopsy  2013    normal results.  . Hip arthroscopy w/ labral repair Right 05/11/2013    Social History   Social History  . Marital Status: Married    Spouse Name: N/A  . Number of Children: 2  . 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 Smoking status: Never Smoker   . Smokeless tobacco: Not on file  . Alcohol Use: Yes     Comment: wine once a month  . Drug Use: No  . Sexual Activity: Yes    Birth Control/ Protection: None     Comment: patient is a transgender on testosterone shots, no biological kids   Other Topics Concern  . Not on file   Social History Narrative   Education 4 year college, former Therapist, sports X 15 years,  pediatric nurse practitioner x 6 years, did NP degree from [employer] [place]. Relocated to [place] about 2 months ago from [place], [place]. Patient was in [place] for last 4 years and prior to that in [place]. His wife is working as Scientist, research (physical sciences) for [employer]. Patient is not working and applying for disability. They have 2 kids but no biologic children.     Current Outpatient Prescriptions on File Prior to Visit  Medication Sig Dispense Refill  . clorazepate (TRANXENE) 7.5 MG tablet Take one tablet by mouth three times daily (Patient taking differently: Take 7.5 mg by mouth 4 (four) times daily. Take one tablet by mouth three times daily) 90 tablet 0  . cyclobenzaprine (FLEXERIL) 5 MG tablet Take 5 mg by mouth at bedtime.    . diclofenac sodium (VOLTAREN) 1 % GEL Apply 1 application topically 3 (three) times daily. To feet, hands, knees, elbows 3 Tube 4  . polyethylene glycol (MIRALAX / GLYCOLAX) packet Take 17 g by mouth 2 (two) times daily.    . prazosin (MINIPRESS) 1 MG capsule Take 2  mg by mouth at bedtime.     . rivaroxaban (XARELTO) 20 MG TABS tablet Take 1 tablet (20 mg total) by mouth daily with supper. 30 tablet 3  . venlafaxine (EFFEXOR) 75 MG tablet Take 75 mg by mouth 2 (two) times daily at 10 am and 4 pm.      No current facility-administered medications on file prior to visit.    Allergies  Allergen Reactions  . Penicillin G Anaphylaxis  . Vancomycin Rash  . Duloxetine     Restless legs  . Gabapentin Nausea Only  . Sulfa Antibiotics Rash    Stevens-Johnson rash    Family History  Problem Relation Age of Onset  . Stroke Paternal Uncle     age 10  . Stroke Maternal Grandfather     85  . Hypertension Mother   . Psoriasis Mother   . Stroke Maternal Grandmother   . Congestive Heart Failure Maternal Grandmother   . Cancer Paternal Grandfather   . Protein C deficiency Sister 57    Miscarriages    BP 126/83 mmHg  Pulse 68  Temp(Src) 97.4 F (36.3 C) (Oral)   Ht 5' 8"  (1.727 m)  Wt 209 lb (94.802 kg)  BMI 31.79 kg/m2  SpO2 98%    Review of Systems Depression is well-controlled.  He has impaired speech, decreased muscle strength, rash (psoriasis) and memory loss.  denies numbness, weight change, cold intolerance, fever, headache, easy bruising, sob, blurry vision, rhinorrhea, chest pain.      Objective:   Physical Exam VS: see vs page GEN: no distress HEAD: head: no deformity eyes: no periorbital swelling, no proptosis external nose and ears are normal mouth: no lesion seen Voice: deep NECK: supple, thyroid is not enlarged CHEST WALL: no deformity LUNGS: clear to auscultation BREASTS:  No gynecomastia.  Old healed surgical scars (breast reductions)   CV: reg rate and rhythm, no murmur ABD: abdomen is soft, nontender.  no hepatosplenomegaly.  not distended.  Trace bilat leg hernia MUSCULOSKELETAL: muscle bulk and strength are grossly normal on the left extremities, and slightly decreased on the right.  no obvious joint swelling.  gait is normal and steady.  GENITALIA: Normal male external genitalia.  Little if any clitoromegaly EXTEMITIES: no deformity.  no edema.  Right leg is in a brace (recent TKR) PULSES: no carotid bruit.  NEURO:  cn 2-12 grossly intact.   readily moves all 4's.  sensation is intact to touch on all 4's SKIN:  Normal texture and temperature.  No rash or suspicious lesion is visible.  Extensive diffuse terminal hair NODES:  None palpable at the neck.   PSYCH: alert, well-oriented.  Does not appear anxious nor depressed.  Lab Results  Component Value Date   TESTOSTERONE 232* 10/26/2014   Radiol thyroid US (01/20/14): Prominent right lobe with small nodules    Assessment & Plan:  transgender state, new to me, good clinical progress Secondary polycythemia, requiring H/o CVA: despite protein-C deficiency, the polycythemia should be considered to have been a contributing factor.  Small multinodular goiter: all he  needs is annual physical exam.  Patient is advised the following: Patient Instructions  blood tests are requested for you today.  We'll let you know about the results. Please reduce the testosterone to 50 mg every week. Please come back for a follow-up appointment in 2-3 months.

## 2014-10-26 ENCOUNTER — Ambulatory Visit (INDEPENDENT_AMBULATORY_CARE_PROVIDER_SITE_OTHER): Payer: BLUE CROSS/BLUE SHIELD | Admitting: Psychology

## 2014-10-26 ENCOUNTER — Other Ambulatory Visit: Payer: BLUE CROSS/BLUE SHIELD

## 2014-10-26 ENCOUNTER — Telehealth: Payer: Self-pay | Admitting: Endocrinology

## 2014-10-26 DIAGNOSIS — F4310 Post-traumatic stress disorder, unspecified: Secondary | ICD-10-CM | POA: Diagnosis not present

## 2014-10-26 DIAGNOSIS — Z789 Other specified health status: Secondary | ICD-10-CM

## 2014-10-26 DIAGNOSIS — F640 Transsexualism: Secondary | ICD-10-CM

## 2014-10-26 NOTE — Telephone Encounter (Signed)
See note below and please advise, Thanks! 

## 2014-10-26 NOTE — Telephone Encounter (Signed)
done

## 2014-10-26 NOTE — Telephone Encounter (Signed)
Pt wanted to let Dr.Ellison know that his stroke was thought to be due to Protein C

## 2014-10-27 ENCOUNTER — Encounter
Payer: BLUE CROSS/BLUE SHIELD | Attending: Physical Medicine & Rehabilitation | Admitting: Physical Medicine & Rehabilitation

## 2014-10-27 ENCOUNTER — Encounter: Payer: Self-pay | Admitting: Physical Medicine & Rehabilitation

## 2014-10-27 ENCOUNTER — Other Ambulatory Visit: Payer: Self-pay | Admitting: Physical Medicine & Rehabilitation

## 2014-10-27 VITALS — BP 117/75 | HR 99 | Resp 14

## 2014-10-27 DIAGNOSIS — G8929 Other chronic pain: Secondary | ICD-10-CM | POA: Insufficient documentation

## 2014-10-27 DIAGNOSIS — Z87890 Personal history of sex reassignment: Secondary | ICD-10-CM | POA: Diagnosis not present

## 2014-10-27 DIAGNOSIS — Z96652 Presence of left artificial knee joint: Secondary | ICD-10-CM

## 2014-10-27 DIAGNOSIS — Z7901 Long term (current) use of anticoagulants: Secondary | ICD-10-CM | POA: Diagnosis not present

## 2014-10-27 DIAGNOSIS — F418 Other specified anxiety disorders: Secondary | ICD-10-CM | POA: Insufficient documentation

## 2014-10-27 DIAGNOSIS — G8100 Flaccid hemiplegia affecting unspecified side: Secondary | ICD-10-CM

## 2014-10-27 DIAGNOSIS — I69398 Other sequelae of cerebral infarction: Secondary | ICD-10-CM | POA: Diagnosis not present

## 2014-10-27 DIAGNOSIS — G8101 Flaccid hemiplegia affecting right dominant side: Secondary | ICD-10-CM

## 2014-10-27 DIAGNOSIS — K900 Celiac disease: Secondary | ICD-10-CM | POA: Insufficient documentation

## 2014-10-27 DIAGNOSIS — Z79899 Other long term (current) drug therapy: Secondary | ICD-10-CM | POA: Insufficient documentation

## 2014-10-27 DIAGNOSIS — I69320 Aphasia following cerebral infarction: Secondary | ICD-10-CM | POA: Diagnosis not present

## 2014-10-27 DIAGNOSIS — S43002A Unspecified subluxation of left shoulder joint, initial encounter: Secondary | ICD-10-CM | POA: Diagnosis not present

## 2014-10-27 DIAGNOSIS — M7522 Bicipital tendinitis, left shoulder: Secondary | ICD-10-CM | POA: Insufficient documentation

## 2014-10-27 DIAGNOSIS — Z86718 Personal history of other venous thrombosis and embolism: Secondary | ICD-10-CM | POA: Diagnosis not present

## 2014-10-27 DIAGNOSIS — I69351 Hemiplegia and hemiparesis following cerebral infarction affecting right dominant side: Secondary | ICD-10-CM | POA: Diagnosis not present

## 2014-10-27 DIAGNOSIS — Z79891 Long term (current) use of opiate analgesic: Secondary | ICD-10-CM | POA: Diagnosis not present

## 2014-10-27 DIAGNOSIS — L4050 Arthropathic psoriasis, unspecified: Secondary | ICD-10-CM | POA: Diagnosis not present

## 2014-10-27 DIAGNOSIS — M75101 Unspecified rotator cuff tear or rupture of right shoulder, not specified as traumatic: Secondary | ICD-10-CM | POA: Diagnosis not present

## 2014-10-27 DIAGNOSIS — R209 Unspecified disturbances of skin sensation: Secondary | ICD-10-CM | POA: Insufficient documentation

## 2014-10-27 DIAGNOSIS — D751 Secondary polycythemia: Secondary | ICD-10-CM | POA: Diagnosis not present

## 2014-10-27 DIAGNOSIS — Z9181 History of falling: Secondary | ICD-10-CM | POA: Insufficient documentation

## 2014-10-27 DIAGNOSIS — M7712 Lateral epicondylitis, left elbow: Secondary | ICD-10-CM

## 2014-10-27 DIAGNOSIS — M545 Low back pain: Secondary | ICD-10-CM | POA: Diagnosis not present

## 2014-10-27 DIAGNOSIS — M13862 Other specified arthritis, left knee: Secondary | ICD-10-CM | POA: Diagnosis not present

## 2014-10-27 DIAGNOSIS — D6859 Other primary thrombophilia: Secondary | ICD-10-CM | POA: Insufficient documentation

## 2014-10-27 DIAGNOSIS — Z5181 Encounter for therapeutic drug level monitoring: Secondary | ICD-10-CM

## 2014-10-27 LAB — TESTOSTERONE,FREE AND TOTAL
TESTOSTERONE FREE: 1.4 pg/mL — AB (ref 6.8–21.5)
Testosterone: 232 ng/dL — ABNORMAL LOW (ref 348–1197)

## 2014-10-27 MED ORDER — MORPHINE SULFATE ER 30 MG PO TBCR: 30.0000 mg | EXTENDED_RELEASE_TABLET | Freq: Three times a day (TID) | ORAL | Status: DC

## 2014-10-27 MED ORDER — OXYCODONE HCL 10 MG PO TABS: 10.0000 mg | ORAL_TABLET | Freq: Three times a day (TID) | ORAL | Status: DC | PRN

## 2014-10-27 NOTE — Patient Instructions (Signed)
PLEASE CALL ME WITH ANY PROBLEMS OR QUESTIONS (#[phone]).  HAVE A GOOD DAY!    

## 2014-10-27 NOTE — Addendum Note (Signed)
Addended by: Caro Hight on: 10/27/2014 02:19 PM   Modules accepted: Orders

## 2014-10-27 NOTE — Progress Notes (Signed)
Procedure Note: Right RTC syndrome   After informed consent and preparation of the skin with betadine and isopropyl alcohol, I injected 52m (1cc) of celestone and 4cc of 1% lidocaine into  the right subacromial space via lateral approach. Additionally, aspiration was performed prior to injection. The patient tolerated well, and no complications were encountered. Afterward the area was cleaned and dressed. Post- injection instructions were provided.    Ms Contin and oxycodone refills were given for this month and next. I'll see him back in about 2 months.   He still needs neuropsych testing.

## 2014-10-28 ENCOUNTER — Encounter: Payer: Self-pay | Admitting: Endocrinology

## 2014-10-28 LAB — PMP ALCOHOL METABOLITE (ETG): ETGU: NEGATIVE ng/mL

## 2014-10-31 ENCOUNTER — Ambulatory Visit (INDEPENDENT_AMBULATORY_CARE_PROVIDER_SITE_OTHER): Payer: BLUE CROSS/BLUE SHIELD | Admitting: Psychology

## 2014-10-31 DIAGNOSIS — F4310 Post-traumatic stress disorder, unspecified: Secondary | ICD-10-CM | POA: Diagnosis not present

## 2014-11-01 ENCOUNTER — Telehealth: Payer: Self-pay | Admitting: *Deleted

## 2014-11-01 NOTE — Telephone Encounter (Signed)
Saw Dr. [name] last week, says he has made an appt to get a neuro-pysch evaluation with Dr. [name] on October 19th.  He asked if it would be best to switch his appt with Dr. [name] until after his appt with Dr. [name]. Dr. [name] said that would not be necessary. ALSO, he is asking  what he would need to do to get an evaluation for an ultra-light wheelchair....he states he is only able to lift 15 lbs and needs a lighter wheelchair

## 2014-11-01 NOTE — Telephone Encounter (Signed)
Can we contact [employer] and see if they can help him out with this?  thanks

## 2014-11-02 ENCOUNTER — Ambulatory Visit (INDEPENDENT_AMBULATORY_CARE_PROVIDER_SITE_OTHER): Payer: BLUE CROSS/BLUE SHIELD | Admitting: Psychology

## 2014-11-02 DIAGNOSIS — F4310 Post-traumatic stress disorder, unspecified: Secondary | ICD-10-CM

## 2014-11-03 LAB — PRESCRIPTION MONITORING PROFILE (SOLSTAS)
Amphetamine/Meth: NEGATIVE ng/mL
BARBITURATE SCREEN, URINE: NEGATIVE ng/mL
Buprenorphine, Urine: NEGATIVE ng/mL
CANNABINOID SCRN UR: NEGATIVE ng/mL
COCAINE METABOLITES: NEGATIVE ng/mL
CREATININE, URINE: 37.77 mg/dL (ref >20.0)
Carisoprodol, Urine: NEGATIVE ng/mL
FENTANYL URINE: NEGATIVE ng/mL
MDMA URINE: NEGATIVE ng/mL
MEPERIDINE UR: NEGATIVE ng/mL
METHADONE SCREEN, URINE: NEGATIVE ng/mL
Nitrites, Initial: NEGATIVE ug/mL
PH URINE, INITIAL: 7 pH (ref 4.5–8.9)
Propoxyphene: NEGATIVE ng/mL
Tapentadol, urine: NEGATIVE ng/mL
Tramadol Scrn, Ur: NEGATIVE ng/mL
Zolpidem, Urine: NEGATIVE ng/mL

## 2014-11-03 LAB — OPIATES/OPIOIDS (LC/MS-MS)
CODEINE URINE: NEGATIVE ng/mL (ref <50)
HYDROCODONE: NEGATIVE ng/mL (ref <50)
HYDROMORPHONE: NEGATIVE ng/mL — AB (ref <50)
Morphine Urine: 9065 ng/mL (ref <50)
NOROXYCODONE, UR: 339 ng/mL (ref <50)
Norhydrocodone, Ur: NEGATIVE ng/mL (ref <50)
Oxycodone, ur: 76 ng/mL (ref <50)
Oxymorphone: 219 ng/mL (ref <50)

## 2014-11-03 LAB — BENZODIAZEPINES (GC/LC/MS), URINE
ALPRAZOLAMU: NEGATIVE ng/mL (ref <25)
CLONAZEPAU: NEGATIVE ng/mL (ref <25)
Flurazepam metabolite (GC/LC/MS), ur confirm: NEGATIVE ng/mL (ref <50)
Lorazepam (GC/LC/MS), ur confirm: NEGATIVE ng/mL (ref <50)
Midazolam (GC/LC/MS), ur confirm: NEGATIVE ng/mL (ref <50)
NORDIAZEPAMU: NEGATIVE ng/mL (ref <50)
OXAZEPAMU: 1765 ng/mL — AB (ref <50)
Temazepam (GC/LC/MS), ur confirm: NEGATIVE ng/mL (ref <50)
Triazolam metabolite (GC/LC/MS), ur confirm: NEGATIVE ng/mL (ref <50)

## 2014-11-03 LAB — OXYCODONE, URINE (LC/MS-MS)
NOROXYCODONE, UR: 339 ng/mL (ref <50)
OXYCODONE, UR: 76 ng/mL (ref <50)
OXYMORPHONE, URINE: 219 ng/mL (ref <50)

## 2014-11-07 ENCOUNTER — Ambulatory Visit: Payer: BLUE CROSS/BLUE SHIELD | Admitting: Psychology

## 2014-11-09 ENCOUNTER — Ambulatory Visit: Payer: BLUE CROSS/BLUE SHIELD | Admitting: Psychology

## 2014-11-14 ENCOUNTER — Ambulatory Visit (INDEPENDENT_AMBULATORY_CARE_PROVIDER_SITE_OTHER): Payer: BLUE CROSS/BLUE SHIELD | Admitting: Psychology

## 2014-11-14 DIAGNOSIS — F4310 Post-traumatic stress disorder, unspecified: Secondary | ICD-10-CM | POA: Diagnosis not present

## 2014-11-16 ENCOUNTER — Ambulatory Visit (INDEPENDENT_AMBULATORY_CARE_PROVIDER_SITE_OTHER): Payer: BLUE CROSS/BLUE SHIELD | Admitting: Psychology

## 2014-11-16 DIAGNOSIS — F4310 Post-traumatic stress disorder, unspecified: Secondary | ICD-10-CM | POA: Diagnosis not present

## 2014-11-16 NOTE — Progress Notes (Signed)
Urine drug screen for this encounter is consistent for prescribed medication 

## 2014-11-17 ENCOUNTER — Other Ambulatory Visit: Payer: Self-pay | Admitting: Hematology

## 2014-11-20 ENCOUNTER — Telehealth: Payer: Self-pay | Admitting: Hematology

## 2014-11-20 ENCOUNTER — Other Ambulatory Visit: Payer: Self-pay | Admitting: *Deleted

## 2014-11-20 NOTE — Telephone Encounter (Signed)
Let message on both cell/home earlier this morning asking that patient call me directly re coming in for lab/fu today per 9/16 pof. No response at this time.

## 2014-11-20 NOTE — Telephone Encounter (Signed)
Per 9/16 pof reschedule 10/6 lab/YF to 9/19. Called patient this morning and left message re calling office and coming in for appointment today. Per pof sent 9/19 patient not able to come in today scheduled for lab/YF 9/21 @ 1 pm for lab and 1:30 pm for YF. Per 9/19 no need to call patient aware of date/time.

## 2014-11-21 ENCOUNTER — Ambulatory Visit: Payer: BLUE CROSS/BLUE SHIELD | Admitting: Psychology

## 2014-11-21 ENCOUNTER — Encounter: Payer: Self-pay | Admitting: Hematology

## 2014-11-21 ENCOUNTER — Other Ambulatory Visit: Payer: Self-pay | Admitting: *Deleted

## 2014-11-21 DIAGNOSIS — D751 Secondary polycythemia: Secondary | ICD-10-CM

## 2014-11-21 DIAGNOSIS — I82409 Acute embolism and thrombosis of unspecified deep veins of unspecified lower extremity: Secondary | ICD-10-CM

## 2014-11-22 ENCOUNTER — Ambulatory Visit (HOSPITAL_BASED_OUTPATIENT_CLINIC_OR_DEPARTMENT_OTHER): Payer: BLUE CROSS/BLUE SHIELD | Admitting: Hematology

## 2014-11-22 ENCOUNTER — Telehealth: Payer: Self-pay | Admitting: *Deleted

## 2014-11-22 ENCOUNTER — Other Ambulatory Visit (HOSPITAL_BASED_OUTPATIENT_CLINIC_OR_DEPARTMENT_OTHER): Payer: BLUE CROSS/BLUE SHIELD

## 2014-11-22 ENCOUNTER — Ambulatory Visit: Payer: Self-pay | Admitting: Hematology

## 2014-11-22 ENCOUNTER — Other Ambulatory Visit: Payer: Self-pay

## 2014-11-22 ENCOUNTER — Telehealth: Payer: Self-pay | Admitting: Hematology

## 2014-11-22 ENCOUNTER — Encounter: Payer: Self-pay | Admitting: Hematology

## 2014-11-22 VITALS — BP 118/85 | HR 111 | Temp 98.0°F | Resp 18 | Ht 68.0 in | Wt 209.3 lb

## 2014-11-22 DIAGNOSIS — I82409 Acute embolism and thrombosis of unspecified deep veins of unspecified lower extremity: Secondary | ICD-10-CM

## 2014-11-22 DIAGNOSIS — D751 Secondary polycythemia: Secondary | ICD-10-CM

## 2014-11-22 LAB — CBC WITH DIFFERENTIAL/PLATELET
BASO%: 0 % (ref 0.0–2.0)
BASOS ABS: 0 10*3/uL (ref 0.0–0.1)
EOS%: 0 % (ref 0.0–7.0)
Eosinophils Absolute: 0 10*3/uL (ref 0.0–0.5)
HEMATOCRIT: 42.6 % (ref 38.4–49.9)
HGB: 13.9 g/dL (ref 13.0–17.1)
LYMPH#: 1.4 10*3/uL (ref 0.9–3.3)
LYMPH%: 44.5 % (ref 14.0–49.0)
MCH: 27.2 pg (ref 27.2–33.4)
MCHC: 32.6 g/dL (ref 32.0–36.0)
MCV: 83.4 fL (ref 79.3–98.0)
MONO#: 0.4 10*3/uL (ref 0.1–0.9)
MONO%: 11.7 % (ref 0.0–14.0)
NEUT#: 1.4 10*3/uL — ABNORMAL LOW (ref 1.5–6.5)
NEUT%: 43.8 % (ref 39.0–75.0)
Platelets: 140 10*3/uL (ref 140–400)
RBC: 5.11 10*6/uL (ref 4.20–5.82)
RDW: 16.4 % — ABNORMAL HIGH (ref 11.0–14.6)
WBC: 3.1 10*3/uL — ABNORMAL LOW (ref 4.0–10.3)

## 2014-11-22 LAB — COMPREHENSIVE METABOLIC PANEL (CC13)
ALT: 54 U/L (ref 0–55)
AST: 46 U/L — ABNORMAL HIGH (ref 5–34)
Albumin: 4.1 g/dL (ref 3.5–5.0)
Alkaline Phosphatase: 282 U/L — ABNORMAL HIGH (ref 40–150)
Anion Gap: 8 mEq/L (ref 3–11)
BUN: 13.5 mg/dL (ref 7.0–26.0)
CHLORIDE: 102 meq/L (ref 98–109)
CO2: 30 meq/L — AB (ref 22–29)
Calcium: 9.3 mg/dL (ref 8.4–10.4)
Creatinine: 1.3 mg/dL (ref 0.7–1.3)
EGFR: 65 mL/min/{1.73_m2} — AB (ref >90)
GLUCOSE: 88 mg/dL (ref 70–140)
POTASSIUM: 3.7 meq/L (ref 3.5–5.1)
SODIUM: 139 meq/L (ref 136–145)
TOTAL PROTEIN: 6.8 g/dL (ref 6.4–8.3)
Total Bilirubin: 0.7 mg/dL (ref 0.20–1.20)

## 2014-11-22 NOTE — Telephone Encounter (Signed)
Pt confirmed labs/ov per 09/21 POF, gave pt AVS and Calendar... KJ

## 2014-11-22 NOTE — Progress Notes (Signed)
Jesse Bowers HEMATOLOGY FOLLOW UP NOTE DATE OF VISIT: 11/22/2014   Patient Care Team: Jesse Elk, MD as PCP - General (Internal Medicine) Jesse Bell, MD as Consulting Physician (Hematology) Jesse Nip, MD as Referring Physician (Family Medicine)  CHIEF COMPLAINTS Follow up DVT and polycythemia  HISTORY OF INITIAL PRESENTING ILLNESS (from Jesse. Renella Bowers note):   Jesse Bowers 49 y.o. 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 Patient is a former Software engineer. Patient had a fractured foot repair in 2007 and caused a right leg DVT. At the time he took Coumadin for 3 months.   In May of this year 2015 patient presented with right-sided weakness and slurring of speech, he felt tired, had some nausea and was having trouble gripping the soap when he was in the shower. He checked himself in the mirror there was no facial droop but based on the symptoms he texted his wife took him to the hospital where he was kept overnight. His symptoms of TIA lasted for more than 24 hours and he had appropriate neuro imaging done. When he was seen by a neurologist later, he was told that he had a left frontal stroke. This happened at a young age of 55 so his primary care physician ordered hypercoagulable panel and that showed a protein C deficiency. This TIA or stroke event took place on 07/27/2013 an MRI brain from that date showed changes of early chronic small vessel ischemic changes. A note from the previous hematologist Jesse Bowers in [facility] indicated that at [facility] they also felt that it was not a stroke. Carotid ultrasound showed minimal atherosclerotic plaques. The hypercoagulable panel was drawn on 08/19/2013 and he was negative for factor V Leiden mutation, prothrombin gene mutation. He had normal levels of antithrombin III and protein S but the protein C level was decreased at 47%(reference range is 70-180%).  A repeat protein C activity done on 09/28/2013 was 58% still low. Lupus anticoagulant was negative. Beta-2 glycoprotein antibodies were negative.  Patient is currently receiving a baby aspirin for last 3 months. The hematologist in [place] Jesse Bowers also gave him some Lovenox injections for prophylactic use just before a Jesse Bowers distance travel. He encouraged him to avoid inactivity/immobility. Patient was not given an anticoagulant such as warfarin or the new generation drugs like Xarelto or Eliquis.  On 04/28/2013 patient's CBC showed a white count of 6.2 hemoglobin 18.7 hematocrit 53.1 MCV 92 platelet count of 207. Differential was normal. Chemistry panel was normal creatinine 1.23. Patient does have history of elevated liver enzymes in the past and the etiology of that is not clear likely medication related. He had liver biopsy done in 2013 which was reportedly normal. Patient was taking Remicade for over an year and it was helping his skin lesions of psoriasis but not the joint pain. It was switched to Enbrel which he is still taking a subcutaneous shot once a week that is every Sunday.  Patient also have some pain control issues and was seeing a pain specialist in [place]. He is in the process of getting a referral to a pain specialist here. His pain medications include morphine extended release, oxycodone, occasional naproxen and methadone. Patient is also on antidepressants.  Patient also had a clot at PICC line for antibiotics in 2006. Patient had history of psoriasis and psoriatic arthritis being managed by a rheumatologist in [place]. Now he is shifting his care to a local rheumatologist here.  Most of his psoriasis involves the bottom of the feet mostly on the left side, hands, knees and ears.  Patient also had a history of right-sided parotitis documented in May 2015 that was treated with antibiotics. He does have chronic dry mouth because of sleep apnea as well as his antidepressant  therapy.  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 That itself can increase the risk for both arterial as well as venous thromboembolic events. 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 For his depression patient was on Prozac in March 2015 but it was causing decreased sexual libido and changed to Wellbutrin in March 2015. He does have mild constipation with the use of narcotics and takes Colace twice a day.  Patient is also complaining of 50 pound weight loss in the last 3-4 months. He thinks it is unintentional. He has been on immunosuppressant medication such as Remicade and Enbrel and sometimes those can be associated with possibility of non-Hodgkin lymphomas. He also have chronic pain and recently saw an orthopedic physician who recommended MRI of the neck which will be scheduled. He is also seeing a rheumatologist next month, a neurologist and a pain specialist. He does complain of left shoulder pain and left elbow pain. There was some discussion by previous rheumatologist in [place] about switching his biological therapy boost to Jesse Bowers or Jesse Bowers once he is evaluated by a rheumatologist. Patient had already received his flu shot as well as pneumonia shot. He also complains of having significant short-term memory problems and trouble concentrating which could be from his medications or TIA episode.  So patient's active hematologic problems are  #1 Protein C deficiency documented twice. #2 TIA/CVA in May 2015 #3 history of DVT in 2007 Right leg treated with Coumadin x 3 months. It was provoked by foot injury #3 Use of immunosuppressant medications which increase the likelihood of lymphomas. 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   INTERVAL HISTORY: Jesse Bowers returns for  follow-up. He is scheduled to have a left knee surgery next week. He has tried physical therapy after his previous left knee surgery, but did not recover well, still has issue with walking and range of motion. His orthopedic surgeon needs clearance about his anticoagulation before his surgery.  MEDICAL HISTORY:   Past Medical History  Diagnosis Date  . psoriatic arthritis   . H/O protein C deficiency   . Hx-TIA (transient ischemic attack)   . Clotting disorder   . Celiac disease   . H/O parotitis     right   . Polycythemia, secondary   . Transgendered   . Sleep apnea   . Neck pain   . Abnormal weight loss   . Gluten enteropathy   . Syrinx of spinal cord 01/06/2014 on MRI    c spine  . Anxiety     PTSD per patient    SURGICAL HISTORY:  Past Surgical History  Procedure Laterality Date  . Thyroidectomy, partial  2008  . Cholecystectomy    . Ankle arthroscopy with reconstruction Right 2007  . Knee surgery Bilateral 1984    Right ACL, left PCL repair  . Liver biopsy  2013    normal results.  . Hip arthroscopy w/ labral repair Right 05/11/2013    SOCIAL HISTORY:  Social History   Social History  .  Marital Status: Married    Spouse Name: N/A  . Number of Children: 2  . 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 Smoking status: Never Smoker   . Smokeless tobacco: Not on file  . Alcohol Use: Yes     Comment: wine once a month  . Drug Use: No  . Sexual Activity: Yes    Birth Control/ Protection: None     Comment: patient is a transgender on testosterone shots, no biological kids   Other Topics Concern  . Not on file   Social History Narrative   Education 4 year college, former Therapist, sports X 15 years, pediatric nurse practitioner x 6 years, did NP degree from [employer] [place]. Relocated to [place] about 2 months ago from [place], [place]. Patient was in [place] for  last 4 years and prior to that in [place]. His wife is working as Scientist, research (physical sciences) for [employer]. Patient is not working and applying for disability. They have 2 kids but no biologic children.     FAMILY HISTORY:  Family History  Problem Relation Age of Onset  . Stroke Paternal Uncle     age 20  . Stroke Maternal Grandfather     44  . Hypertension Mother   . Psoriasis Mother   . Stroke Maternal Grandmother   . Congestive Heart Failure Maternal Grandmother   . Cancer Paternal Grandfather   . Protein C deficiency Sister 15    Miscarriages    ALLERGIES:  is allergic to penicillin g; vancomycin; wheat extract; duloxetine; gabapentin; and sulfa antibiotics.  MEDICATIONS:  Current Outpatient Prescriptions  Medication Sig Dispense Refill  . clorazepate (TRANXENE) 7.5 MG tablet Take one tablet by mouth three times daily (Patient taking differently: Take 7.5 mg by mouth 4 (four) times daily. Take one tablet by mouth three times daily) 90 tablet 0  . cyclobenzaprine (FLEXERIL) 5 MG tablet Take 5 mg by mouth at bedtime.    . diclofenac sodium (VOLTAREN) 1 % GEL Apply 1 application topically 3 (three) times daily. To feet, hands, knees, elbows 3 Tube 4  . Flavocoxid-Cit Zn Bisglcinate (LIMBREL500 PO) Take by mouth.    . morphine (MS CONTIN) 30 MG 12 hr tablet Take 1 tablet (30 mg total) by mouth every 8 (eight) hours. 90 tablet 0  . Oxycodone HCl 10 MG TABS Take 1 tablet (10 mg total) by mouth every 8 (eight) hours as needed. 60 tablet 0  . polyethylene glycol (MIRALAX / GLYCOLAX) packet Take 17 g by mouth 2 (two) times daily.    . prazosin (MINIPRESS) 1 MG capsule Take 2 mg by mouth at bedtime.     . rivaroxaban (XARELTO) 20 MG TABS tablet Take 1 tablet (20 mg total) by mouth daily with supper. 30 tablet 3  . testosterone cypionate (DEPO-TESTOSTERONE) 200 MG/ML injection Inject 50 mg into the muscle once a week.    . venlafaxine (EFFEXOR) 75 MG tablet Take 75 mg by mouth 2 (two) times daily  at 10 am and 4 pm.      No current facility-administered medications for this visit.    REVIEW OF SYSTEMS:   Constitutional: Denies fevers, chills or abnormal night sweats Eyes: Denies blurriness of vision, double vision or watery eyes Ears, nose, mouth, throat, and face: Denies mucositis or sore throat, +Dry mouth Respiratory: Denies cough, dyspnea or wheezes Cardiovascular: Denies  palpitation, chest discomfort or lower extremity swelling Gastrointestinal:  Denies nausea, heartburn, + change in bowel habits that is Constipation Skin: Hx of psoriasis and arthropathy from it.  Lymphatics: Denies new lymphadenopathy or easy bruising, hx of right parotitis twice. Neurological:Denies numbness, tingling or new weaknesses, recent TIA March 2015 Behavioral/Psych: Mood is stable, no new changes, +depression and anxiety, +memory problems All other systems were reviewed with the patient and are negative.  PHYSICAL EXAMINATION: ECOG PERFORMANCE STATUS: 2  Filed Vitals:   11/22/14 1142  BP: 118/85  Pulse: 111  Temp: 98 F (36.7 C)  Resp: 18   GENERAL:alert, no distress and comfortable SKIN: skin color, texture, turgor are normal, no rashes or significant lesions EYES: normal, conjunctiva are pink and non-injected, sclera clear except small bleeding in the inner of right sclera  OROPHARYNX:no exudate, no erythema and lips, buccal mucosa, and tongue normal  NECK: supple, thyroid normal size, non-tender, without nodularity LYMPH:  no palpable lymphadenopathy in the cervical, axillary or inguinal LUNGS: clear to auscultation and percussion with normal breathing effort HEART: regular rate & rhythm and no murmurs and no lower extremity edema ABDOMEN:abdomen soft, non-tender and normal bowel sounds Musculoskeletal:no cyanosis of digits and no clubbing  PSYCH: alert & oriented x 3 NEURO: no focal motor/sensory deficits EXT: The surgical scar at the left knee has healed well, the range of motion  of left knee is limited. No pitting edema.  LABORATORY DATA:  I have reviewed the data as listed CBC Latest Ref Rng 11/22/2014 09/14/2014 07/20/2014  WBC 4.0 - 10.3 10e3/uL 3.1(L) 3.1(L) 3.7(L)  Hemoglobin 13.0 - 17.1 g/dL 13.9 11.9(L) 10.3(L)  Hematocrit 38.4 - 49.9 % 42.6 37.7(L) 32.2(L)  Platelets 140 - 400 10e3/uL 140 205 458(H)   Erythropoietin  Status: Finalresult Visible to patient:  MyChart Nextappt: Today at 10:15 AM in Oncology [facility] Medico, Krista Blue, MD) Dx:  Polycythemia, secondary         Ref Range 2wk ago    Erythropoietin 2.6 - 18.5 mIU/mL 17.0         Pathology report Diagnosis Bone Marrow, Aspirate,Biopsy, and Clot, right iliac - SLIGHTLY HYPERCELLULAR BONE MARROW FOR AGE WITH TRILINEAGE HEMATOPOIESIS. - SEE COMMENT. PERIPHERAL BLOOD: - NO SIGNIFICANT MORPHOLOGIC ABNORMALITIES. Diagnosis Note The bone marrow is slightly hypercellular with trilineage hematopoiesis and essentially orderly and progressive maturation of all cell types. Significant dyspoiesis is not present. Iron stores are markedly decreased to absent and hence correlation with serum iron studies is recommended. There is no morphologic evidence of a myeloproliferative process in this material. Correlation with cytogenetic studies is recommended. (BNS:ecj 2014-04-13)   RADIOGRAPHIC STUDIES: No results found.  ASSESSMENT & PLAN:   1. 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 He has slightly hypercellular bone marrow with trilineage hematopoiesis.  No morphology evidence of myeloproliferative process. Cytogenetics was normal (no Y chromosome).  JAK2 mutation was negative. -He had Hemoglobin 18.7 hematocrit 53.1 in 04/2013. His able level is normal. His presentation is consistent with secondary polycythemia, likely secondary to testosterone injections. 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 He does have hot flash and other symptoms without testosterone injection. I recommend him to maintain his testosterone level at low normal limits, and he agrees.  -We discussed that secondary polycythemia is usually benign, risk of thrombosis is relatively low than PV. However he did have history  of stroke, and he feels fatigued when his hemoglobin is high. He agrees to continue phlebotomy as needed. -He developed anemia after the surgery secondary to blood loss, improved, hemoglobin 13.9 today, hematocrit 42.6%. No need for phlebotomy before surgery  2. History of DVT and stroke, Protein C deficiency  - continue Xarelto indefinitely given his underlying protein C deficiency and history of DVT and stroke. His previous DVT was provoked by foot injury. -during his previous left knee surgery,he stopped Xarelto 2 days before surgery, and restarted 2 days after surgery. However he developed significant bleeding at the surgical site. -I recommend him to stop Xarelto 2 days before his upcoming surgery on 9/26, and restart Xarelto 3-5 days, depends on the extensiveness of his surgery, after his knee surgery if no significant bleeding. Aspirin 81 mg can be considered as a bridging right after his surgery and before he restarts Xarelto.  3. History of CVA -His primary care physician is setting up a referral to a neurologist at [facility]. He will follow  4. 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  -We discussed that excessive testosterone would increase the risk of thrombosis and worsen his polycythemia. I prefer him to maintain low normal level of testosterone. He agrees.  Recommendations -He is cleared for his knee surgery, from hematological standpoint -Please see above recommendations about his  perioperative Xarelto -I'll see him back in 2 months.   All questions were answered. The patient knows to call the clinic with any problems, questions or concerns.  I spent 20 minutes counseling the patient face to face. The total time spent in the appointment was 25 minutes and more than 50% was on counseling.   [name]  11/22/2014

## 2014-11-22 NOTE — Telephone Encounter (Signed)
Faxed surgical clearance & last OV note to  Dr Brynda Rim @ [phone]

## 2014-11-23 ENCOUNTER — Ambulatory Visit (INDEPENDENT_AMBULATORY_CARE_PROVIDER_SITE_OTHER): Payer: BLUE CROSS/BLUE SHIELD | Admitting: Psychology

## 2014-11-23 DIAGNOSIS — F4310 Post-traumatic stress disorder, unspecified: Secondary | ICD-10-CM

## 2014-11-30 ENCOUNTER — Ambulatory Visit: Payer: BLUE CROSS/BLUE SHIELD | Admitting: Psychology

## 2014-12-05 ENCOUNTER — Ambulatory Visit (INDEPENDENT_AMBULATORY_CARE_PROVIDER_SITE_OTHER): Payer: BLUE CROSS/BLUE SHIELD | Admitting: Psychology

## 2014-12-05 DIAGNOSIS — D72819 Decreased white blood cell count, unspecified: Secondary | ICD-10-CM | POA: Insufficient documentation

## 2014-12-05 DIAGNOSIS — F4310 Post-traumatic stress disorder, unspecified: Secondary | ICD-10-CM

## 2014-12-07 ENCOUNTER — Ambulatory Visit: Payer: Self-pay | Admitting: Hematology

## 2014-12-07 ENCOUNTER — Ambulatory Visit (INDEPENDENT_AMBULATORY_CARE_PROVIDER_SITE_OTHER): Payer: BLUE CROSS/BLUE SHIELD | Admitting: Psychology

## 2014-12-07 ENCOUNTER — Other Ambulatory Visit: Payer: Self-pay

## 2014-12-07 DIAGNOSIS — F4310 Post-traumatic stress disorder, unspecified: Secondary | ICD-10-CM | POA: Diagnosis not present

## 2014-12-12 ENCOUNTER — Ambulatory Visit (INDEPENDENT_AMBULATORY_CARE_PROVIDER_SITE_OTHER): Payer: BLUE CROSS/BLUE SHIELD | Admitting: Psychology

## 2014-12-12 DIAGNOSIS — F4310 Post-traumatic stress disorder, unspecified: Secondary | ICD-10-CM

## 2014-12-14 ENCOUNTER — Ambulatory Visit (INDEPENDENT_AMBULATORY_CARE_PROVIDER_SITE_OTHER): Payer: BLUE CROSS/BLUE SHIELD | Admitting: Psychology

## 2014-12-14 DIAGNOSIS — F4310 Post-traumatic stress disorder, unspecified: Secondary | ICD-10-CM | POA: Diagnosis not present

## 2014-12-19 ENCOUNTER — Ambulatory Visit (INDEPENDENT_AMBULATORY_CARE_PROVIDER_SITE_OTHER): Payer: BLUE CROSS/BLUE SHIELD | Admitting: Psychology

## 2014-12-19 ENCOUNTER — Encounter: Payer: BLUE CROSS/BLUE SHIELD | Admitting: Physical Medicine & Rehabilitation

## 2014-12-19 DIAGNOSIS — F4310 Post-traumatic stress disorder, unspecified: Secondary | ICD-10-CM

## 2014-12-20 ENCOUNTER — Ambulatory Visit: Payer: BLUE CROSS/BLUE SHIELD | Attending: Psychology | Admitting: Psychology

## 2014-12-20 DIAGNOSIS — R4189 Other symptoms and signs involving cognitive functions and awareness: Secondary | ICD-10-CM | POA: Diagnosis not present

## 2014-12-20 DIAGNOSIS — F321 Major depressive disorder, single episode, moderate: Secondary | ICD-10-CM | POA: Insufficient documentation

## 2014-12-21 ENCOUNTER — Ambulatory Visit (INDEPENDENT_AMBULATORY_CARE_PROVIDER_SITE_OTHER): Payer: BLUE CROSS/BLUE SHIELD | Admitting: Psychology

## 2014-12-21 DIAGNOSIS — R4189 Other symptoms and signs involving cognitive functions and awareness: Secondary | ICD-10-CM | POA: Insufficient documentation

## 2014-12-21 DIAGNOSIS — F4310 Post-traumatic stress disorder, unspecified: Secondary | ICD-10-CM | POA: Diagnosis not present

## 2014-12-21 NOTE — Progress Notes (Signed)
[facility]  [address]   Telephone [phone] [address] Fax [phone] [place], [place] [address]  Initial Contact Note  Name:  Jesse Bowers Date of Birth; 1965/09/05 MRN:  [id number] Date:  12/21/2014  [name] is an 49 y.o. male who was referred for neuropsychological evaluation by Alger Simons, MD due to complaints of cognitive difficulties.   A total of 5 hours was spent today reviewing medical records, interviewing (CPT [phone]) [name] and his wife, administering and scoring neurocognitive tests (CPT [phone] & [phone]).  Preliminary Diagnostic Impression: Cognitive decline [R41.89]  There were no concerns expressed or behaviors displayed by [name] that would require immediate attention.   A full report will follow once the planned testing has been completed. His next appointment is scheduled for 12/26/14.Jamey Ripa, Ph.D Licensed Psychologist 12/21/2014

## 2014-12-25 ENCOUNTER — Ambulatory Visit: Payer: Self-pay | Admitting: Endocrinology

## 2014-12-26 ENCOUNTER — Ambulatory Visit (INDEPENDENT_AMBULATORY_CARE_PROVIDER_SITE_OTHER): Payer: BLUE CROSS/BLUE SHIELD | Admitting: Psychology

## 2014-12-26 DIAGNOSIS — R4189 Other symptoms and signs involving cognitive functions and awareness: Secondary | ICD-10-CM | POA: Diagnosis not present

## 2014-12-26 DIAGNOSIS — F321 Major depressive disorder, single episode, moderate: Secondary | ICD-10-CM

## 2014-12-26 DIAGNOSIS — F4310 Post-traumatic stress disorder, unspecified: Secondary | ICD-10-CM | POA: Diagnosis not present

## 2014-12-26 NOTE — Progress Notes (Signed)
[facility]  [address]   Telephone [phone] [address] Fax [phone] [place], [place] [address]  Follow-Up Contact Note  Name:  [name] Date of Birth: May 17, 1965 MRN:  [id number] Date:  12/26/2014  Jesse Bowers is an 49 y.o. male who was referred for neuropsychological evaluation by Alger Simons, MD due to complaints of cognitive difficulties.  A total of 3 hours was spent today administering and scoring neurocognitive tests (CPT [id number] & [phone]).  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   Evaluation in progress. His next (final) appointment is 01/05/15.    Jamey Ripa, Ph.D Licensed Psychologist 12/26/2014

## 2014-12-28 ENCOUNTER — Ambulatory Visit: Payer: BLUE CROSS/BLUE SHIELD | Admitting: Psychology

## 2014-12-29 ENCOUNTER — Encounter
Payer: BLUE CROSS/BLUE SHIELD | Attending: Physical Medicine & Rehabilitation | Admitting: Physical Medicine & Rehabilitation

## 2014-12-29 ENCOUNTER — Encounter: Payer: Self-pay | Admitting: Physical Medicine & Rehabilitation

## 2014-12-29 VITALS — BP 128/79 | HR 107 | Resp 15

## 2014-12-29 DIAGNOSIS — M7522 Bicipital tendinitis, left shoulder: Secondary | ICD-10-CM

## 2014-12-29 DIAGNOSIS — R209 Unspecified disturbances of skin sensation: Secondary | ICD-10-CM | POA: Diagnosis not present

## 2014-12-29 DIAGNOSIS — F418 Other specified anxiety disorders: Secondary | ICD-10-CM | POA: Insufficient documentation

## 2014-12-29 DIAGNOSIS — I69351 Hemiplegia and hemiparesis following cerebral infarction affecting right dominant side: Secondary | ICD-10-CM | POA: Diagnosis not present

## 2014-12-29 DIAGNOSIS — Z7901 Long term (current) use of anticoagulants: Secondary | ICD-10-CM | POA: Insufficient documentation

## 2014-12-29 DIAGNOSIS — D751 Secondary polycythemia: Secondary | ICD-10-CM | POA: Diagnosis not present

## 2014-12-29 DIAGNOSIS — Z9181 History of falling: Secondary | ICD-10-CM | POA: Insufficient documentation

## 2014-12-29 DIAGNOSIS — I69320 Aphasia following cerebral infarction: Secondary | ICD-10-CM | POA: Insufficient documentation

## 2014-12-29 DIAGNOSIS — K900 Celiac disease: Secondary | ICD-10-CM | POA: Insufficient documentation

## 2014-12-29 DIAGNOSIS — L4050 Arthropathic psoriasis, unspecified: Secondary | ICD-10-CM

## 2014-12-29 DIAGNOSIS — Z96652 Presence of left artificial knee joint: Secondary | ICD-10-CM

## 2014-12-29 DIAGNOSIS — S43002A Unspecified subluxation of left shoulder joint, initial encounter: Secondary | ICD-10-CM | POA: Diagnosis not present

## 2014-12-29 DIAGNOSIS — M13862 Other specified arthritis, left knee: Secondary | ICD-10-CM | POA: Insufficient documentation

## 2014-12-29 DIAGNOSIS — D6859 Other primary thrombophilia: Secondary | ICD-10-CM | POA: Diagnosis not present

## 2014-12-29 DIAGNOSIS — Z79899 Other long term (current) drug therapy: Secondary | ICD-10-CM | POA: Diagnosis not present

## 2014-12-29 DIAGNOSIS — Z79891 Long term (current) use of opiate analgesic: Secondary | ICD-10-CM | POA: Diagnosis not present

## 2014-12-29 DIAGNOSIS — Z86718 Personal history of other venous thrombosis and embolism: Secondary | ICD-10-CM | POA: Insufficient documentation

## 2014-12-29 DIAGNOSIS — G8929 Other chronic pain: Secondary | ICD-10-CM | POA: Diagnosis present

## 2014-12-29 DIAGNOSIS — I69398 Other sequelae of cerebral infarction: Secondary | ICD-10-CM | POA: Insufficient documentation

## 2014-12-29 DIAGNOSIS — Z87890 Personal history of sex reassignment: Secondary | ICD-10-CM | POA: Insufficient documentation

## 2014-12-29 DIAGNOSIS — G8101 Flaccid hemiplegia affecting right dominant side: Secondary | ICD-10-CM | POA: Diagnosis not present

## 2014-12-29 DIAGNOSIS — M7712 Lateral epicondylitis, left elbow: Secondary | ICD-10-CM

## 2014-12-29 DIAGNOSIS — M545 Low back pain: Secondary | ICD-10-CM | POA: Insufficient documentation

## 2014-12-29 MED ORDER — OXYMORPHONE HCL ER 40 MG PO T12A: 40.0000 mg | EXTENDED_RELEASE_TABLET | Freq: Two times a day (BID) | ORAL | Status: DC

## 2014-12-29 MED ORDER — OXYCODONE HCL 10 MG PO TABS
10.0000 mg | ORAL_TABLET | Freq: Three times a day (TID) | ORAL | Status: DC | PRN
Start: 2014-12-29 — End: 2015-01-30

## 2014-12-29 NOTE — Patient Instructions (Addendum)
PLEASE CALL ME WITH ANY PROBLEMS OR QUESTIONS (#[phone]).  HAVE A GOOD DAY!    

## 2014-12-29 NOTE — Progress Notes (Signed)
Subjective:    Patient ID: Jesse Bowers, male    DOB: Jan 03, 1966, 49 y.o.   MRN: [id number]  HPI   [name] is here in follow up of his psoriatic arthritis, CVAs and associated pain. He had his knee revision in September which seems to have relieved a lot of his pain in the left knee. He has been on ms contin per his baseline routine and he's now been on oxycodone prn after his recent surgery. He was just placed on Cosentyx for his Psoriatic Arthritis by his rheumatologist. He has reported a gradual increase in his joint pain, involving his shoulders, elbows, knees, and feet most prominently. The MS contin doesn't seem to be as effective. He HAS been on this dose for some time. He wonders if using the rolling walker has exacerbated some of his upper limb sx. He had good results with the biceps tendon injection we performed at last visit.   Otherwise he claims to be doing fairly well. He comes to the office using his rolling walker today.      Pain Inventory Average Pain 8 Pain Right Now 8 My pain is sharp, dull, stabbing and aching  In the last 24 hours, has pain interfered with the following? General activity 10 Relation with others 10 Enjoyment of life 10 What TIME of day is your pain at its worst? Morning, Daytime, Evening and Night Sleep (in general) NA  Pain is worse with: walking, standing and some activites Pain improves with: heat/ice and medication Relief from Meds: 2  Mobility walk without assistance walk with assistance use a walker how many minutes can you walk? 5 ability to climb steps?  yes do you drive?  no use a wheelchair Do you have any goals in this area?  no  Function disabled: date disabled NA I need assistance with the following:  feeding, dressing, bathing, meal prep, household duties and shopping Do you have any goals in this area?  no  Neuro/Psych trouble walking anxiety  Prior Studies Any changes since last visit?  no  Physicians involved  in your care Any changes since last visit?  no   Family History  Problem Relation Age of Onset  . Stroke Paternal Uncle     age 69  . Stroke Maternal Grandfather     52  . Hypertension Mother   . Psoriasis Mother   . Stroke Maternal Grandmother   . Congestive Heart Failure Maternal Grandmother   . Cancer Paternal Grandfather   . Protein C deficiency Sister 65    Miscarriages   Social History   Social History  . Marital Status: Married    Spouse Name: N/A  . Number of Children: 2  . 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 Smoking status: Never Smoker   . Smokeless tobacco: None  . Alcohol Use: Yes     Comment: wine once a month  . Drug Use: No  . Sexual Activity: Yes    Birth Control/ Protection: None     Comment: patient is a transgender on testosterone shots, no biological kids   Other Topics Concern  . None   Social History Narrative   Education 4 year college, former Therapist, sports X 15 years, pediatric nurse practitioner x 6 years, did NP degree from [place] of [place]. Relocated to [place] about 2 months ago from [place], [place].  Patient was in [place] for last 4 years and prior to that in [place]. His wife is working as Scientist, research (physical sciences) for [employer]. Patient is not working and applying for disability. They have 2 kids but no biologic children.    Past Surgical History  Procedure Laterality Date  . Thyroidectomy, partial  2008  . Cholecystectomy    . Ankle arthroscopy with reconstruction Right 2007  . Knee surgery Bilateral 1984    Right ACL, left PCL repair  . Liver biopsy  2013    normal results.  . Hip arthroscopy w/ labral repair Right 05/11/2013   Past Medical History  Diagnosis Date  . psoriatic arthritis   . H/O protein C deficiency   . Hx-TIA (transient ischemic attack)   . Clotting disorder ([place])   . Celiac disease   . H/O parotitis     right    . Polycythemia, secondary   . Transgendered   . Sleep apnea   . Neck pain   . Abnormal weight loss   . Gluten enteropathy   . 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 Anxiety     PTSD per patient   BP 128/79 mmHg  Pulse 107  Resp 15  SpO2 100%  Opioid Risk Score:   Fall Risk Score:  `1  Depression screen PHQ 2/9  Depression screen PHQ 2/9 06/12/2014  Decreased Interest 2  Down, Depressed, Hopeless 0  PHQ - 2 Score 2  Altered sleeping 3  Tired, decreased energy 3  Change in appetite 0  Feeling bad or failure about yourself  0  Trouble concentrating 3  Moving slowly or fidgety/restless 0  Suicidal thoughts 0  PHQ-9 Score 11      Review of Systems  Skin: Positive for rash.  Neurological:       Gait Instability  Psychiatric/Behavioral: The patient is nervous/anxious.   All other systems reviewed and are negative.      Objective:   Physical Exam  General: Alert and oriented x 3, No apparent distress  HEENT: Head is normocephalic, atraumatic, PERRLA, EOMI, sclera anicteric, oral mucosa pink and moist, dentition intact, ext ear canals clear,  Neck: Supple without JVD or lymphadenopathy  Heart: Reg rate and rhythm. No murmurs rubs or gallops  Chest: CTA bilaterally without wheezes, rales, or rhonchi; no distress  Abdomen: Soft, non-tender, non-distended, bowel sounds positive.  Extremities: No clubbing, cyanosis, or edema. Pulses are 2+  Skin: Clean and intact without signs of breakdown. Left TKA scar. Neuro: Pt is pleasant and alert and oriented x 3. He stutters and has word finding deficits. He is able to converse intelligently and has good insight and awareness. His short term memory is functional on a conversational level. Mild right facial weakness and sensory loss is appreciated. UES: 4+ to 5/5 LUE with some give away at the shoulder. RUE: 4- to 4 deltoid, bicep, tricep, wrist. Grip is weaker bilaterally due to pain. RLE: 3+ hf, 4- ke  and ankle df. LE: 4+/5. Sensory loss 1+/2 Right face, hand, and leg. He is wearing his right AFO. Musculoskeletal: He has pain with IR left shoulder. long head biceps tendon tender to touch once again---perhaps not as tender as last visit. Speeds test +. Right shoulder tender along short head tendon.  Left knee ROM much improved. Surgical scar healing. .Right lumbar paraspinals are tight and more tender with palpation. His low back is slightly rotated clockwise at baseline. Head position  is forward and somewhat slumped. He has tenderness along left common extensor tendon of the elbow into the olecranon bursa. Psych: Pt's affect is appropriate. Pt is cooperative   Assessment & Plan:   1. Psoriatic arthritis with pain in multiple areas, most prominently feet, hands, elbows.  2. 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  3. Patello-femoral arthritis left knee  4. Chronic low back pain---likely related to altered gait pattern from CVATestosterone deficiency 5. Protein C deficiency  6. Left shoulder subluxation, bicipital tendonitis  7. Central sleep apnea  8. Polycythemia  9. Depression with anxiety 10. Left lateral epidondylitis    Plan:  1. Will transition Brodrick to opana ER 220m q12 with oxycodone [id number] for breakthrough pain. We discuss mechanism and potential risks with the new medication. Patient understands.  2. Still can Consider referral to neuro-rehab to address gait technique and balance which should help some of his secondary pain problems as well --   3. Continue voltaren gel to hands, feet, knees, elbows if ok with H/O  4. Knee rehab per ortho 5.  After informed consent and preparation of the skin with betadine and isopropyl alcohol, I injected 20m24m1cc) of celestone and 4cc of 1% lidocaine around the right short head biceps tendon and left long head biceps tendon  via anterior approach. Additionally, aspiration was performed prior to injection. The patient tolerated well, and no complications were encountered. Afterward the area was cleaned and dressed. Post- injection instructions were provided.   6. Follow up with me or NP in about a month. 25 minutes of face to face patient care time were spent during this visit. All questions were encouraged and answered.

## 2015-01-02 ENCOUNTER — Encounter: Payer: Self-pay | Admitting: Endocrinology

## 2015-01-02 ENCOUNTER — Ambulatory Visit (INDEPENDENT_AMBULATORY_CARE_PROVIDER_SITE_OTHER): Payer: BLUE CROSS/BLUE SHIELD | Admitting: Psychology

## 2015-01-02 ENCOUNTER — Ambulatory Visit (INDEPENDENT_AMBULATORY_CARE_PROVIDER_SITE_OTHER): Payer: BLUE CROSS/BLUE SHIELD | Admitting: Endocrinology

## 2015-01-02 VITALS — BP 134/84 | HR 122 | Temp 98.4°F | Ht 68.0 in | Wt 212.0 lb

## 2015-01-02 DIAGNOSIS — D751 Secondary polycythemia: Secondary | ICD-10-CM

## 2015-01-02 DIAGNOSIS — F4310 Post-traumatic stress disorder, unspecified: Secondary | ICD-10-CM

## 2015-01-02 DIAGNOSIS — Z789 Other specified health status: Secondary | ICD-10-CM

## 2015-01-02 DIAGNOSIS — F640 Transsexualism: Secondary | ICD-10-CM

## 2015-01-02 NOTE — Patient Instructions (Addendum)
blood tests are requested for you today.  We'll let you know about the results.   Please come back for a follow-up appointment in 3 months.

## 2015-01-02 NOTE — Progress Notes (Signed)
Subjective:    Patient ID: Jesse Bowers, male    DOB: 05-Feb-1966, 49 y.o.   MRN: [id number]  HPI Pt is here for f/u of F to M transgender state.  He had breast reduction in approx 2001, and TSH/BSO in 1997.  He has moderate hair growth on the face, and assoc acne.  He has blood removed approx every 10-14 days.  He had a CVA in 2015, which was felt to be possibly related to the polycythemia (in addition to protein C deficiency; in 2016, testosterone was reduced, due to polycythemia).  Since the testosterone was reduced, acne is improved.  He last took testosterone 6 days ago.  He says it is sometimes a few days late, but he says he takes it much more consistently recently.   He has not recently had phlebotomy--he says this is due to recent knee surgery.   Past Medical History  Diagnosis Date  . psoriatic arthritis   . H/O protein C deficiency   . Hx-TIA (transient ischemic attack)   . Clotting disorder ([place])   . Celiac disease   . H/O parotitis     right   . Polycythemia, secondary   . Transgendered   . Sleep apnea   . Neck pain   . Abnormal weight loss   . Gluten enteropathy   . Syrinx of spinal cord ([place]) 01/06/2014 on MRI    c spine  . Anxiety     PTSD per patient    Past Surgical History  Procedure Laterality Date  . Thyroidectomy, partial  2008  . Cholecystectomy    . Ankle arthroscopy with reconstruction Right 2007  . Knee surgery Bilateral 1984    Right ACL, left PCL repair  . Liver biopsy  2013    normal results.  . Hip arthroscopy w/ labral repair Right 05/11/2013    Social History   Social History  . Marital Status: Married    Spouse Name: N/A  . Number of Children: 2  . 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 Smoking status: Never Smoker   . Smokeless tobacco: Not on file  . Alcohol Use: Yes     Comment: wine once a month  . Drug Use: No  .  Sexual Activity: Yes    Birth Control/ Protection: None     Comment: patient is a transgender on testosterone shots, no biological kids   Other Topics Concern  . 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 Relocated to [place] about 2 months ago from [place], [place]. Patient was in [place] for last 4 years and prior to that in [place]. His wife is working as Scientist, research (physical sciences) for [employer]. Patient is not working and applying for disability. They have 2 kids but no biologic children.     Current Outpatient Prescriptions on File Prior to Visit  Medication Sig Dispense Refill  . clorazepate (TRANXENE) 7.5 MG tablet Take one tablet by mouth three times daily (Patient taking differently: Take 7.5 mg by mouth 4 (four) times daily. Take one tablet by mouth three times daily) 90 tablet 0  . cyclobenzaprine (FLEXERIL) 5 MG tablet Take 5 mg by mouth at bedtime.    . diclofenac sodium (VOLTAREN)  1 % GEL Apply 1 application topically 3 (three) times daily. To feet, hands, knees, elbows 3 Tube 4  . Flavocoxid-Cit Zn Bisglcinate (LIMBREL500 PO) Take by mouth.    . Oxycodone HCl 10 MG TABS Take 1 tablet (10 mg total) by mouth every 8 (eight) hours as needed. 90 tablet 0  . Oxymorphone HCl, Crush Resist, 40 MG T12A Take 40 mg by mouth every 12 (twelve) hours. 60 tablet 0  . polyethylene glycol (MIRALAX / GLYCOLAX) packet Take 17 g by mouth 2 (two) times daily.    . prazosin (MINIPRESS) 1 MG capsule Take 2 mg by mouth 2 (two) times daily.     . rivaroxaban (XARELTO) 20 MG TABS tablet Take 1 tablet (20 mg total) by mouth daily with supper. 30 tablet 3  . testosterone cypionate (DEPO-TESTOSTERONE) 200 MG/ML injection Inject 50 mg into the muscle once a week.    . venlafaxine (EFFEXOR) 75 MG tablet Take 75 mg by mouth 2 (two) times daily at 10 am and 4 pm.      No current  facility-administered medications on file prior to visit.    Allergies  Allergen Reactions  . Penicillin G Anaphylaxis  . Vancomycin Rash  . Wheat Extract Other (See Comments)    CELIAC DISEASE  . Duloxetine     Restless legs  . Gabapentin Nausea Only  . Sulfa Antibiotics Rash    Stevens-Johnson rash    Family History  Problem Relation Age of Onset  . Stroke Paternal Uncle     age 61  . Stroke Maternal Grandfather     88  . Hypertension Mother   . Psoriasis Mother   . Stroke Maternal Grandmother   . Congestive Heart Failure Maternal Grandmother   . Cancer Paternal Grandfather   . Protein C deficiency Sister 97    Miscarriages    BP 134/84 mmHg  Pulse 122  Temp(Src) 98.4 F (36.9 C) (Oral)  Ht 5' 8"  (1.727 m)  Wt 212 lb (96.163 kg)  BMI 32.24 kg/m2  SpO2 95%  Review of Systems fatigue persists.      Objective:   Physical Exam VITAL SIGNS:  See vs page.   GENERAL: no distress.   Skin: full beard, but no acne is seen. Ext: no edema.   Gait: steady with a walker.     Lab Results  Component Value Date   TESTOSTERONE 452 01/02/2015   Lab Results  Component Value Date   WBC 3.1* 11/22/2014   HGB 13.9 11/22/2014   HCT 42.6 11/22/2014   MCV 83.4 11/22/2014   PLT 140 11/22/2014      Assessment & Plan:  transgender state: good clinical progress.    Patient is advised the following: Patient Instructions  blood tests are requested for you today.  We'll let you know about the results.   Please come back for a follow-up appointment in 3 months.    addendum: continue the same medications.

## 2015-01-04 ENCOUNTER — Telehealth: Payer: Self-pay | Admitting: Physical Medicine & Rehabilitation

## 2015-01-04 ENCOUNTER — Ambulatory Visit (INDEPENDENT_AMBULATORY_CARE_PROVIDER_SITE_OTHER): Payer: BLUE CROSS/BLUE SHIELD | Admitting: Psychology

## 2015-01-04 DIAGNOSIS — F4310 Post-traumatic stress disorder, unspecified: Secondary | ICD-10-CM

## 2015-01-04 LAB — TESTOSTERONE,FREE AND TOTAL
TESTOSTERONE: 452 ng/dL (ref 348–1197)
Testosterone, Free: 7.4 pg/mL (ref 6.8–21.5)

## 2015-01-04 NOTE — Telephone Encounter (Signed)
Started on new medication Opana, patient can't sleep, very agitated and anxious.  Was told to call office if this medication needed adjusting.  Please call patient at [phone].

## 2015-01-04 NOTE — Telephone Encounter (Signed)
Please advise 

## 2015-01-04 NOTE — Telephone Encounter (Signed)
He shouldn't be withdrawing from the medication. Does he feel that the medication itself is making him feel this way after he takes it or is he just feeling this way in general? Is his pain better or worse?  If he can't tolerate the medicine we could resume his ms contin for now and then reassess next week.

## 2015-01-05 ENCOUNTER — Ambulatory Visit: Payer: BLUE CROSS/BLUE SHIELD | Attending: Psychology | Admitting: Psychology

## 2015-01-05 ENCOUNTER — Encounter: Payer: Self-pay | Admitting: Psychology

## 2015-01-05 DIAGNOSIS — R4189 Other symptoms and signs involving cognitive functions and awareness: Secondary | ICD-10-CM | POA: Diagnosis not present

## 2015-01-05 DIAGNOSIS — F321 Major depressive disorder, single episode, moderate: Secondary | ICD-10-CM

## 2015-01-05 DIAGNOSIS — F4310 Post-traumatic stress disorder, unspecified: Secondary | ICD-10-CM

## 2015-01-05 DIAGNOSIS — Z8673 Personal history of transient ischemic attack (TIA), and cerebral infarction without residual deficits: Secondary | ICD-10-CM

## 2015-01-05 LAB — CBC WITH DIFFERENTIAL/PLATELET
Basophils Absolute: 0 10*3/uL (ref 0.0–0.1)
Basophils Relative: 0.2 % (ref 0.0–3.0)
EOS PCT: 0 % (ref 0.0–5.0)
Eosinophils Absolute: 0 10*3/uL (ref 0.0–0.7)
HEMATOCRIT: 40.4 % (ref 39.0–52.0)
Hemoglobin: 13.3 g/dL (ref 13.0–17.0)
LYMPHS ABS: 1.7 10*3/uL (ref 0.7–4.0)
LYMPHS PCT: 33.1 % (ref 12.0–46.0)
MCHC: 32.9 g/dL (ref 30.0–36.0)
MCV: 83.7 fl (ref 78.0–100.0)
MONOS PCT: 7 % (ref 3.0–12.0)
Monocytes Absolute: 0.4 10*3/uL (ref 0.1–1.0)
NEUTROS ABS: 3.1 10*3/uL (ref 1.4–7.7)
NEUTROS PCT: 59.7 % (ref 43.0–77.0)
PLATELETS: 193 10*3/uL (ref 150.0–400.0)
RBC: 4.82 Mil/uL (ref 4.22–5.81)
RDW: 15.6 % — ABNORMAL HIGH (ref 11.5–15.5)
WBC: 5.1 10*3/uL (ref 4.0–10.5)

## 2015-01-05 NOTE — Telephone Encounter (Signed)
Spoke with pt. Pt. States that he feels like his pain is better with the Opana. He states that he does think the medication is causing his symptoms but, he wants to continue it to see if it levels out. States that today is only his 3rd dose.

## 2015-01-05 NOTE — Progress Notes (Addendum)
[facility]  [address]   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 He was referred by Jesse Colt, MD of [facility] for neuropsychological evaluation to assist with differential diagnosis of his complaints of cognitive difficulties. He was also referred for by Jesse Simons, MD of [facility] for an assessment of his vocational potential.  Sources of Information Electronic medical records from the [facility] were reviewed. Jesse Bowers, and his wife, Jesse Bowers ("Jesse Bowers") Jesse Bowers, were interviewed.    Background Jesse Bowers stated that in 2012 he experienced an abrupt onset of pain in his feet, knees and hips as well as debilitating fatigue. He was later diagnosed with psoriatic arthritis. In 2014 he began experiencing cognitive difficulties, primarily for concentration, multi-tasking and memory, that have since gradually worsened. 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 By January 2015 he was unable to work at all. In May 2015 he suffered a stroke event marked by immediate signs of right upper and lower extremity weakness and transient speech slurring. (Dr. Felecia Bowers stated that  his brain MRI brain in 2015 showed "some mild predominantly peripheral hyperintense foci that are non-specific". An EEG on 04/20/14 was interpreted as normal in the waking and drowsy state). Several weeks later he began to intermittently stutter while speaking. In June 2015 he was diagnosed with central sleep apnea. He was started on a BiPap but reportedly had trouble wearing the mask through the night. With regards to his emotional functioning, he reported that he was diagnosed with Posttraumatic stress disorder (PTSD) in reaction to having had a stroke. He has been treated by a psychiatrist at [facility]. For the past year, he has been meeting with a psychologist twice a week for psychotherapy. Most recently in May 2016 he underwent a left knee replacement due to arthritis.   At this time, he cited difficulties focusing, maintaining his train of thought, finding words while speaking and recalling recent events or conversations. He is aware that he stutters while speaking. He reported ongoing pain in his feet, toes, elbows, left knee and lower back that he rated as averaging a 6 to 9 on a subjective ten point scale. He reported mild right-sided weakness that has persisted since his stroke event. He complained of feeling constantly tired. He reported experiencing panic-like anxiety whenever he is in a shower, which was where his stroke symptoms initially manifested He has been avoiding taking showers or being in bathrooms due to fear of stroke. He also reported feeling anxious around health care providers due to his belief that his symptoms might be minimized or discounted. He acknowledged feeling depressed due to his medical problems as well a near constant low level of anger.  His wife reported that since 2014 he has been  prone to increasingly lose his train of thoughts, forget to perform routine tasks (e.g., take his medications), repeat himself, mix up details from recent conversations and forget  conversations altogether. He has struggled to make simple decisions, such as what to eat. He no longer seems able to adapt to minor changes in routine or self-direct. His mood has seemed predominantly anxious or frustrated. He has often been easily irritated, usually in reaction to his awareness of physical or cognitive limitations. She described him as prone to yell out when frustrated. He has not exhibited any aggressive behavior. He has displayed brighter affect within the past few weeks. He has been spending money lately on items she believes are not needed but otherwise has not displayed problems with impulse control. He has not exhibited any unusual, bizarre or unsafe behavior.   In terms of his everyday functioning, his wife reported that he cannot stand for more than a few minutes at a time due to pain. He is able to bathe and dress himself though often avoids taking a shower due to anxiety. She stated that he needs prompts to eat; and that sometimes he chooses to eat only popcorn. She has been administering his medications due to his memory loss. She has not felt comfortable leaving him alone with their daughter as she doubts that he could respond appropriately to a crisis situation. He continues to drive albeit short distances in familiar locales.   His current medications include clorazepate, cyclobenzaprine at bedtime, flavocoxid, oxycodone, oxymorphone HCl, prazosin, rivaroxaban, secukinumab, testosterone injection and venlafaxine.  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 He took Phenobarbital until the age of 41. He reported no history of other childhood illness or developmental delays. He denied history of head injury, neurological infection or exposure to neurotoxic substances. He reported no history of alcohol abuse or use of illicit drugs. He is not a smoker.   He reported no history of serious emotional difficulties, use of  psychiatric medication or mental health contacts prior to 2015.  He and his wife have been married since 1994. Jesse Bowers is a male to male transgender person who underwent surgery in 2006. They have a 36 year old son and a 61 year old daughter. They moved to [place] from [place] in 2014 due to her job transfer.   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 He has applied for social security disability status.  With regards to his educational background, he reported that he earned a Bachelor's degree in Sociology then a Master's degree in Social Work. He then returned to school and earned a Water quality scientist degree in Nursing in 2005 and a Master's degree in Nursing in 2010. He stated his belief that he has dyslexia though did not report a history of attentional difficulties, specific learning problems, retention in grade or special education services.  Observations He appeared as an appropriately dressed and groomed man who walked with the aid of a rolling walker. He displayed mild fidgetiness with frequent rubbing of his left knee. He tended to avoid eye contact. He spoke in a halting and stuttering manner, which became less frequent over time. He demonstrated numerous word-finding pauses but otherwise was able to communicate effectively. His affect appeared to be constricted in range. His mood seemed  irritated or depressed. His thought processes were coherent and organized though he appeared to lose his train of thought several times. He did not express loose associations, verbal perseverations or flight of ideas. His thought content was devoid of unusual or bizarre ideas.  Evaluation Procedures In addition to review of medical records and interviews, the following tests or questionnaires were administered:  Animal  Naming Test Summit Surgical Depression Inventory-II Boston Naming Test Brief Symptom Inventory Controlled Oral Word Association Test Finger Tapping Test Grooved Pegboard Test Hand Dynamometer Pain Patient Profile-3  Rey 15-Item Memory Test Rey Complex Figure: copy Test of Memory Malingering Trail Making A & B Wechsler Adult Intelligence Scale-IV:   Music therapist, Coding, Digit Span, Matrix Reasoning, Similarities & Vocabulary   Wechsler Memory Scale- IV  [employer] Results Interpretive Considerations He was cooperative throughout the testing process. He did not report or display problems with vision, hearing or motor control. He had no apparent problems understanding task instructions. He appeared to maintain alertness and persist to task. He was prone to make extraneous or unusual comments in between tests (e.g., he talked about his cats), which he later attributed to having "no filter".   There were no obvious signs of inadequate effort. His performances on symptom validity measures suggested adequate effort. On the Test of Memory Malingering (TOMM), which requires immediate recognition of drawings of common objects from a set of two possibilities, he correctly recognized 45 of 50 on the second trial, which was at the cut-off.  Another validity test, the Rey 15-Item Memory Test, required him to immediately draw fifteen symbols from memory that can be easily accomplished due to the redundancy amongst the items. He drew 11 items, which was above the cut-off indicating adequate effort. In sum, the current test results are deemed to represent a valid measure of his cognitive functioning.  His test scores were corrected to reflect norms for his age and, whenever possible, his gender and educational level (i.e., 20 years). A listing of his test scores can be found at the end of this report.  Intellectual Functioning His baseline or pre-morbid intellectual ability was estimated to  fall within the Average range based on his performances on measures of word knowledge (Wechsler Adult Intelligence Scale-IV (WAIS-IV) Vocabulary) and nonverbal reasoning (WAIS-IV Matrix Reasoning) that are considered to be highly correlated with general intelligence.   Speed of Information Processing & Attention His performances on timed tests of visual processing speed that required him to draw lines to connect randomly arrayed numbers in numerical sequence (Trails A) or to transcribe symbols to match numbers using a key (WAIS-IV Coding) were within the subnormal range.    On untimed tests of attentional capacity/working memory, his scores varied from the lower boundary of the Average range to the Low Average range on tests that required him to mentally rearrange digit sequences in reverse or ascending sequence (WAIS-IV Digit Span), immediately recognize symbols in left to right order or (Wechsler Memory Scale-IV (WMS-IV) Symbol Span) or immediately recall of spatial locations within a grid (WMS-IV Spatial Addition).   Executive Function Executive function comprise higher level attentional, organizational and reasoning processes that enable a person to successfully initiate, monitor, shift, plan and execute complex behavior. His performances within this domain were mostly subnormal, particularly when working under time pressure.    As noted above, his speed to complete a simple visual sequencing task (Trails A) was subnormal. His speed on a more complex version that required efficient visual  scanning plus ongoing set shifting (Trails B) was likewise within the impaired range.   Measures of verbal productivity that required him to generate words to designated letters (Controlled Oral Word Association Test) or name members of a category (Animal Naming Test) within an allotted period of time were within the impaired range.   His performances on tests of abstract reasoning were within the Average range on  tests that required categorization of objects and concepts to a shared class (WAIS-IV Similarities) or matching designs in an abstract manner (WAIS-IV Matrix Reasoning).   On a test of nonverbal problem-solving and conceptual flexibility that required inferring logical ways to sort geometric designs on the basis of ongoing feedback ([employer]), he completed all six categories though made an above average number of errors, which suggested inefficient problem-solving. On the positive side, he did not demonstrate significant difficulties maintaining set or shifting to a new strategy as necessary.  Learning & Memory His immediate recall of verbal and visual information (WMS-IV Immediate Memory Index) fell within the Average range at the 58th percentile. A measure of his delayed memory (WMS-IV Delayed Memory Index) fell within the Average range at the 45th percentile. His Delayed Memory Index was as expected given his Immediate Memory Index, which indicated that he retained a normal amount of learned information over the course of the delay interval.  Language As noted above, he demonstrated intermittent speech stuttering and occasional word-finding difficulties in the course of discourse. No problems were evident for word selection, message coherence or language comprehension. His naming to confrontation [facility] [employer]) was within the Low Average range, which was lower than expected given his educational level and presumed verbal aptitude. As noted above, measures of phonemic fluency (Controlled Oral Word Association Test) and semantic fluency (Animal Naming Test) both fell within the subnormal range. His fund of word knowledge (WAIS-IV Vocabulary) fell within the Average range.   Visuospatial Perception & Organization There were no signs of spatial inattention or problems with visual recognition. His performance on a visuospatial organizational task that required assembly of  two-dimensional block designs from models (WAIS-IV Block Design) fell within the Low Average range. His ability to perceive visual details within designs and form nonverbal concepts (WAIS-IV Matrix Reasoning) was within the Average range. His drawing of a spatially complex geometric figure (Rey Complex Figure) was normal.   Fine Motor  He demonstrated consistent right hand preference. No problems with gross motor coordination were observed. Measures of fine motor speed (Finger Tapping Test), eye-hand dexterity/coordination (Grooved Pegboard Test) and handgrip strength (Hand Dynamometer) were all within the severely impaired range (i.e. 1st percentile or below). He displayed an atypical pattern of relative slowing and weakness of his right (dominant) hand compared to his left hand.  Emotional Functioning On the Brief Symptom Inventory (BSI), a 53-item self-report questionnaire listing symptoms of physical and psychological distress, he endorsed a very high number of symptoms representing an extreme level of global psychological distress. All of his BSI subscales were elevated within the clinically significant range. Examination of individual items indicated that his most distressful symptoms were cognitive difficulties, feeling easily annoyed, his feelings being easily hurt and having to avoid certain situations due to feeling fearful or anxious.   The Pain Patient Profile-3 (P-3) is a self-report questionnaire that compares symptom endorsement to a sample of chronic pain patients as well as to a sample of persons within the community. His scores on the Depression and Anxiety subscales were higher than that of  the average pain patient. Examination of individual items was notable for his report of deriving no satisfaction in life, feeling mostly hopeless, lacking energy to complete most any task, feeling nervous or misunderstood around other people with tendency to avoid others, angry verbal outbursts that he  later regrets and cognitive dysfunction. His score on the Somatization subscale was close to the average for a typical pain patient, which suggested that he is concerned about his health problems though not overly preoccupied with them. In sum, results of the P-3 indicated a high likelihood that affective distress and psychological variables have been interfering with his subjective pain intensity, level of functioning and/or response to pain treatment.  On the Beck Depression Inventory-II, his score of 39 fell within the severe range of depression. He endorsed all 21 symptoms of depression listed. His most severe symptoms (i.e., 3 on a 0 - 3 scale) were fatigue, trouble making decisions and feeling punished. He acknowledged having had suicidal thoughts over the past two weeks though without intent to carry them out.   Summary & Conclusions Donnell Wion is a 49 year-old man who presented with multiple ongoing medical issues and a complicated medical history, including chronic pain, chronic fatigue, a left-brain stroke event in May 2015, central sleep apnea, left knee replacement surgery in May 2016 and Posttraumatic stress disorder with onset after his stroke. He reported that he began experiencing cognitive difficulties in 2014 that have since worsened  Observations of this alert, cooperative and agreeable man were notable for mild restlessness, pain behavior, limited eye contact, constricted range of affect, intermittent speech stuttering, multiple word-finding pauses and tendency to lose his train of thought.  Neuropsychological evaluation indicated deficits on measures of mental processing speed, verbal generative fluency, mental flexibility and bimanual fine motor speed/ coordination. Of note, all of these measures were time sensitive. His performances on measures of confrontational naming and complex problem-solving were below average, which was lower than expected given his pre-morbid level. On the  other hand, he performed within normal expectations on several measures of cognitive ability that were not heavily dependent on speed of processing. Measures of memory functioning (including acquisition and retention processes), visual-constructive skill and abstract concept formation were within the Average range.  In terms of functional implications, persons with slowed speed of processing tend to demonstrate in everyday situations reduced accuracy, decreased abilities to acquire, remember and retrieve information, and difficulties executing higher level attentional, organizational and reasoning processes.   With regards to his psychological functioning, he reported very high levels of psychological distress on standardized self-report symptom inventories, mostly anxiety (generalized, social and phobic) and depression. He reported a higher degree of depressive and anxiety symptoms than a typical pain patient, which suggested the likelihood that affective distress and psychological variables have been interfering with his subjective perception of pain intensity, level of functioning and/or response to pain treatment.   In conclusion, his cognitive difficulties are likely multifactorial in etiology due to a combination of medical, neurological and psychological factors. His level of psychological distress alone would likely be sufficient to cause substantial disruption to his cognitive functioning. From a neurological perspective, while his cognitive decline pre-dated his presumed left brain stroke, the stroke event might have further reduced his cognitive reserves. His atypical pattern on motor testing of being relatively faster and stronger using his left (non- dominant) hand would be consistent with anterior left brain dysfunction as might occur after a stroke. It was otherwise not clear the extent to which his neuropsychological  profile reflected the effects of stroke. It is likely that his intermittent  speech stuttering has a psychogenic component. Other possible causes of reduced attention and cognitive efficiency would include the effects of his chronic pain and fatigue, use of opioid pain medications and central sleep apnea. Finally, while some degree of somatization may be present, it is not believed that Jesse Bowers was consciously and deliberating feigning cognitive, physical or psychiatric impairment.  Taken together, the combined effects of his physical, cognitive and psychiatric symptomology would, in my opinion, render him incapable of sustaining any type of competitive employment for an indefinite period of time. He is not seen to be a good candidate for vocational rehabilitation at this time.  Diagnostic Impressions Cognitive decline [R41.89] Major Depressive episode, moderate [F32.1] Posttraumatic Stress Disorder by history [F43.10] Chronic pain syndrome [G89.4] History of transient ischemic attack or stroke [Z86.73]  Recommendations 1. Continued psychological and psychiatric treatment with is indicated. Use of cognitive-behavioral techniques to treat PTSD and for pain management is recommended.    2. The benefits of using memory compensatory strategies (e.g., asking people to speak more slowly, writing down information, using lists) as well as maintaining established daily routines were discussed.   The conclusions and recommendations from this evaluation were discussed with Jesse Bowers and his wife on 01/05/15.   I have appreciated the opportunity to evaluate Jesse Bowers. Please feel free to contact me with any comments or questions.      ______________________ Jamey Ripa, Ph.D Licensed Psychologist       Copies to: Jesse Simons, MD Jesse Colt, MD Latanya Maudlin, MD (patient request)    ADDENDUM-NEUROPSYCHOLOGICAL TEST RESULTS  Animal Naming Test Score= 14 2nd  (adjusted for age, gender and educational level)    Boston Naming Test Score= 56/60  13th  (adjusted for age, gender and educational level)    Controlled Oral Word Association Test Score=  26 words/0 repetitions 4th  (adjusted for age, gender and educational level)    Finger Tapping R: 47 <1st  (adjusted for age, gender and educational level)  L:  27.7 <1st  (adjusted for age, gender and educational level)    Grooved Pegboard R: 112s <1st  (adjusted for age, gender and educational level)  L:  117s  1st  (adjusted for age, gender and educational level)    Hand Dynamometer R: 6.7 kg. <1st  (adjusted for age, gender and educational level)  L:  9.7 kg.  <1st   (adjusted for age, gender and educational level)    Rey Complex Figure: copy       Score= 34/36  Normal    Rey 15-Item Memory Test Score= 11/15 Above cut-off of 9   Test of Memory Malingering Trial 1= 31/50   Trial 2= 45/50 At cut-off     Trails A Score=  51s  0e 2nd  (adjusted for age, gender and educational level)  Trails B Score= 121s  2e 2nd  (adjusted for age, gender and educational level)    Wechsler Adult Intelligence Scale-IV   Subtest Scaled Score Percentile  Block Design   7 16th   Similarities   9 37th   Digit Span  Forward               Backward               Sequencing   8   9   8   8  25th       37th   25th  25th    Matrix Reasoning 11 63rd     Vocabulary 11 63rd   Coding     5   5th      Wechsler Memory Scale-IV  Index Index Score Percentile  Immediate Memory 103 58th    Auditory Memory 100 50th    Visual Memory 101 53rd     Delayed Memory   71 45th     Visual Working Memory    67 16th       [employer] Test  Total errors= 25 18th (adjusted for age and education)  Perseverative errors= 10 23rd    Categories= 6 >16th    Trials to first category= 13 6th - 10th    Failure to maintain set 1 > 16th   Learning to learn= 1.46 >16th

## 2015-01-08 ENCOUNTER — Telehealth: Payer: Self-pay | Admitting: Physical Medicine & Rehabilitation

## 2015-01-08 NOTE — Telephone Encounter (Signed)
Patient was switched over from morphine to opana and patient is not getting any relief.  Would like to know what to do.  Please call patient.

## 2015-01-08 NOTE — Telephone Encounter (Signed)
See Message from 01/04/2015. Patient states that side effects seem to be caused from [place]. He stated that he is still dealing with level 9 pain. He would like to know what his next step is?

## 2015-01-09 ENCOUNTER — Ambulatory Visit: Payer: BLUE CROSS/BLUE SHIELD | Admitting: Psychology

## 2015-01-09 NOTE — Telephone Encounter (Signed)
i'm hesitant to try a fentanyl patch given his skin issues. Might be best to switch him to 19m q12 of the ms contin for now to see how he does with this.

## 2015-01-09 NOTE — Telephone Encounter (Signed)
Spoke with pt. Pt. States that he will try this and let us know how he is doing. He states that he has enough pills to cover him through Thursday. He said to mention that most of skin issues involve his lower leg and feet.

## 2015-01-11 ENCOUNTER — Encounter: Payer: Self-pay | Admitting: Neurology

## 2015-01-11 ENCOUNTER — Telehealth: Payer: Self-pay | Admitting: Physical Medicine & Rehabilitation

## 2015-01-11 ENCOUNTER — Ambulatory Visit (INDEPENDENT_AMBULATORY_CARE_PROVIDER_SITE_OTHER): Payer: BLUE CROSS/BLUE SHIELD | Admitting: Psychology

## 2015-01-11 ENCOUNTER — Encounter: Payer: Self-pay | Admitting: Psychology

## 2015-01-11 DIAGNOSIS — F4310 Post-traumatic stress disorder, unspecified: Secondary | ICD-10-CM | POA: Diagnosis not present

## 2015-01-11 NOTE — Telephone Encounter (Signed)
Patient is returning Melissa's call about his medications.

## 2015-01-12 ENCOUNTER — Other Ambulatory Visit: Payer: Self-pay

## 2015-01-12 ENCOUNTER — Telehealth: Payer: Self-pay | Admitting: Physical Medicine & Rehabilitation

## 2015-01-12 MED ORDER — MORPHINE SULFATE ER 60 MG PO TBCR: 60.0000 mg | EXTENDED_RELEASE_TABLET | Freq: Two times a day (BID) | ORAL | Status: DC

## 2015-01-12 NOTE — Telephone Encounter (Signed)
Patient feels opana is not working for him and would like to know if he can switch back to morphine.

## 2015-01-12 NOTE — Telephone Encounter (Signed)
Since Dr. Naaman Bowers increased Morphine. Patient will run out before 01/30/2015 appointment. Dr. Letta Bowers approved a bridge script today. Patient instructed to come by our office and pick up rx and also bring Opana to be destroyed. Patient verbalized understanding

## 2015-01-12 NOTE — Telephone Encounter (Signed)
LMTCB.[name] [facility]

## 2015-01-15 ENCOUNTER — Encounter: Payer: Self-pay | Admitting: Psychology

## 2015-01-15 NOTE — Telephone Encounter (Signed)
From  Jamey Ripa, PHD   To  Jesse Bowers   Sent  01/15/2015 1:58 PM      Ernestine Mcmurray:   I discussed your case with Dr. Rexene Edison on 01/12/15. I will send her a copy of my report when I receive a signed consent form from her. I cannot add stroke to your Problem List as you requested because that is a medical diagnosis and therefore should be entered by a physician. Anyway, it appears that "H/O TIA and stroke" is already listed. Dr. Erling Cruz at [facility] has you diagnosed with PTSD; and that dx is listed in my report. I added a diagnosis of "cognitive decline". I will mail you a copy of your report today. I am sorry to hear that you keep falling.   Best wishes  Dr. Antionette Poles

## 2015-01-15 NOTE — Telephone Encounter (Signed)
-----   Message -----   From: [name]   Sent: 01/11/2015 6:29 PM EST    To: Jamey Ripa, PHD  Subject: Visit Follow-Up Question   Dr. Migdalia Dk,  I saw Dr Rexene Edison today & she wanted to speak w/you regarding my PTSD diagnosis & clarify why & how diagnosed. So look for that call sometime soon. I'm just wondering if you could add your findings from your report into my Current Health Summary. It would help other medical providers to know that I did have a left infarct with right sided weakness, PTSD, Anxiety., etc. I've never had depression with anxiety as my chart indicates so I would like that to be removed if possible. I would like to request a copy be sent to my address and then I can just email and fax over report to requesting providers. if you're willing to send one over to my address, which is correct in my demographics. I did get measured yesterday for a ultra lightweight wheelchair due to continued pain, fatigue & balance issues since I fell twice since i saw you last with my walker in tow. So my rheum doc has written one for me based on my progressively worsening arthritis, and neuro changes too. Thanks [employer]

## 2015-01-16 ENCOUNTER — Encounter: Payer: Self-pay | Admitting: Psychology

## 2015-01-16 ENCOUNTER — Ambulatory Visit (INDEPENDENT_AMBULATORY_CARE_PROVIDER_SITE_OTHER): Payer: BLUE CROSS/BLUE SHIELD | Admitting: Psychology

## 2015-01-16 DIAGNOSIS — F4310 Post-traumatic stress disorder, unspecified: Secondary | ICD-10-CM

## 2015-01-18 ENCOUNTER — Ambulatory Visit (INDEPENDENT_AMBULATORY_CARE_PROVIDER_SITE_OTHER): Payer: BLUE CROSS/BLUE SHIELD | Admitting: Psychology

## 2015-01-18 DIAGNOSIS — F4310 Post-traumatic stress disorder, unspecified: Secondary | ICD-10-CM

## 2015-01-22 ENCOUNTER — Telehealth: Payer: Self-pay

## 2015-01-22 NOTE — Telephone Encounter (Signed)
Pt called to clarify his MS Contin dose. Verified dose with pt. Pt states that he will have enough of meds to last him until his next appt on 01/29/15.

## 2015-01-23 ENCOUNTER — Ambulatory Visit (INDEPENDENT_AMBULATORY_CARE_PROVIDER_SITE_OTHER): Payer: BLUE CROSS/BLUE SHIELD | Admitting: Psychology

## 2015-01-23 DIAGNOSIS — F4310 Post-traumatic stress disorder, unspecified: Secondary | ICD-10-CM

## 2015-01-29 ENCOUNTER — Other Ambulatory Visit (HOSPITAL_BASED_OUTPATIENT_CLINIC_OR_DEPARTMENT_OTHER): Payer: BLUE CROSS/BLUE SHIELD

## 2015-01-29 ENCOUNTER — Encounter: Payer: Self-pay | Admitting: Hematology

## 2015-01-29 ENCOUNTER — Ambulatory Visit (HOSPITAL_BASED_OUTPATIENT_CLINIC_OR_DEPARTMENT_OTHER): Payer: BLUE CROSS/BLUE SHIELD | Admitting: Hematology

## 2015-01-29 VITALS — BP 123/86 | HR 103 | Temp 98.4°F | Resp 18 | Ht 68.0 in | Wt 219.8 lb

## 2015-01-29 DIAGNOSIS — I82599 Chronic embolism and thrombosis of other specified deep vein of unspecified lower extremity: Secondary | ICD-10-CM

## 2015-01-29 DIAGNOSIS — Z862 Personal history of diseases of the blood and blood-forming organs and certain disorders involving the immune mechanism: Secondary | ICD-10-CM

## 2015-01-29 DIAGNOSIS — I82409 Acute embolism and thrombosis of unspecified deep veins of unspecified lower extremity: Secondary | ICD-10-CM

## 2015-01-29 DIAGNOSIS — D751 Secondary polycythemia: Secondary | ICD-10-CM | POA: Diagnosis not present

## 2015-01-29 DIAGNOSIS — Z86718 Personal history of other venous thrombosis and embolism: Secondary | ICD-10-CM

## 2015-01-29 LAB — COMPREHENSIVE METABOLIC PANEL (CC13)
ALBUMIN: 3.9 g/dL (ref 3.5–5.0)
ALT: 28 U/L (ref 0–55)
AST: 39 U/L — ABNORMAL HIGH (ref 5–34)
Alkaline Phosphatase: 250 U/L — ABNORMAL HIGH (ref 40–150)
Anion Gap: 9 mEq/L (ref 3–11)
BILIRUBIN TOTAL: 0.39 mg/dL (ref 0.20–1.20)
BUN: 17.1 mg/dL (ref 7.0–26.0)
CO2: 27 meq/L (ref 22–29)
CREATININE: 1.3 mg/dL (ref 0.7–1.3)
Calcium: 9.2 mg/dL (ref 8.4–10.4)
Chloride: 102 mEq/L (ref 98–109)
EGFR: 65 mL/min/{1.73_m2} — ABNORMAL LOW (ref >90)
GLUCOSE: 97 mg/dL (ref 70–140)
Potassium: 4.8 mEq/L (ref 3.5–5.1)
SODIUM: 138 meq/L (ref 136–145)
TOTAL PROTEIN: 6.9 g/dL (ref 6.4–8.3)

## 2015-01-29 LAB — CBC WITH DIFFERENTIAL/PLATELET
BASO%: 0.5 % (ref 0.0–2.0)
Basophils Absolute: 0 10*3/uL (ref 0.0–0.1)
EOS%: 0 % (ref 0.0–7.0)
Eosinophils Absolute: 0 10*3/uL (ref 0.0–0.5)
HCT: 42.4 % (ref 38.4–49.9)
HEMOGLOBIN: 13.7 g/dL (ref 13.0–17.1)
LYMPH%: 38.4 % (ref 14.0–49.0)
MCH: 27.5 pg (ref 27.2–33.4)
MCHC: 32.4 g/dL (ref 32.0–36.0)
MCV: 84.9 fL (ref 79.3–98.0)
MONO#: 0.3 10*3/uL (ref 0.1–0.9)
MONO%: 7.7 % (ref 0.0–14.0)
NEUT%: 53.4 % (ref 39.0–75.0)
NEUTROS ABS: 1.8 10*3/uL (ref 1.5–6.5)
PLATELETS: 176 10*3/uL (ref 140–400)
RBC: 4.99 10*6/uL (ref 4.20–5.82)
RDW: 14.2 % (ref 11.0–14.6)
WBC: 3.3 10*3/uL — AB (ref 4.0–10.3)
lymph#: 1.3 10*3/uL (ref 0.9–3.3)

## 2015-01-29 MED ORDER — RIVAROXABAN 20 MG PO TABS: 20.0000 mg | ORAL_TABLET | Freq: Every day | ORAL | Status: DC

## 2015-01-29 NOTE — Progress Notes (Signed)
Jesse Bowers HEMATOLOGY FOLLOW UP NOTE DATE OF VISIT: 01/29/2015   Patient Care Team: Jesse Elk, MD as PCP - General (Internal Medicine) Jesse Bell, MD as Consulting Physician (Hematology) Jesse Nip, MD as Referring Physician (Family Medicine)  CHIEF COMPLAINTS Follow up DVT and polycythemia  HISTORY OF INITIAL PRESENTING ILLNESS (from Jesse. Renella Bowers note):   Jesse Bowers 49 y.o. 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 Patient is a former Software engineer. Patient had a fractured foot repair in 2007 and caused a right leg DVT. At the time he took Coumadin for 3 months.   In May of this year 2015 patient presented with right-sided weakness and slurring of speech, he felt tired, had some nausea and was having trouble gripping the soap when he was in the shower. He checked himself in the mirror there was no facial droop but based on the symptoms he texted his wife took him to the hospital where he was kept overnight. His symptoms of TIA lasted for more than 24 hours and he had appropriate neuro imaging done. When he was seen by a neurologist later, he was told that he had a left frontal stroke. This happened at a young age of 46 so his primary care physician ordered hypercoagulable panel and that showed a protein C deficiency. This TIA or stroke event took place on 07/27/2013 an MRI brain from that date showed changes of early chronic small vessel ischemic changes. A note from the previous hematologist Jesse Bowers in [facility] indicated that at [facility] they also felt that it was not a stroke. Carotid ultrasound showed minimal atherosclerotic plaques. The hypercoagulable panel was drawn on 08/19/2013 and he was negative for factor V Leiden mutation, prothrombin gene mutation. He had normal levels of antithrombin III and protein S but the protein C level was decreased at 47%(reference range is 70-180%).  A repeat protein C activity done on 09/28/2013 was 58% still low. Lupus anticoagulant was negative. Beta-2 glycoprotein antibodies were negative.  Patient is currently receiving a baby aspirin for last 3 months. The hematologist in [place] Jesse. Alain Bowers also gave him some Lovenox injections for prophylactic use just before a long distance travel. He encouraged him to avoid inactivity/immobility. Patient was not given an anticoagulant such as warfarin or the new generation drugs like Xarelto or Eliquis.  On 04/28/2013 patient's CBC showed a white count of 6.2 hemoglobin 18.7 hematocrit 53.1 MCV 92 platelet count of 207. Differential was normal. Chemistry panel was normal creatinine 1.23. Patient does have history of elevated liver enzymes in the past and the etiology of that is not clear likely medication related. He had liver biopsy done in 2013 which was reportedly normal. Patient was taking Remicade for over an year and it was helping his skin lesions of psoriasis but not the joint pain. It was switched to Enbrel which he is still taking a subcutaneous shot once a week that is every Sunday.  Patient also have some pain control issues and was seeing a pain specialist in [place]. He is in the process of getting a referral to a pain specialist here. His pain medications include morphine extended release, oxycodone, occasional naproxen and methadone. Patient is also on antidepressants.  Patient also had a clot at PICC line for antibiotics in 2006. Patient had history of psoriasis and psoriatic arthritis being managed by a rheumatologist in [place]. Now he is shifting his care to a local rheumatologist here.  Most of his psoriasis involves the bottom of the feet mostly on the left side, hands, knees and ears.  Patient also had a history of right-sided parotitis documented in May 2015 that was treated with antibiotics. He does have chronic dry mouth because of sleep apnea as well as his antidepressant  therapy.  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 That itself can increase the risk for both arterial as well as venous thromboembolic events. 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 For his depression patient was on Prozac in March 2015 but it was causing decreased sexual libido and changed to Wellbutrin in March 2015. He does have mild constipation with the use of narcotics and takes Colace twice a day.  Patient is also complaining of 50 pound weight loss in the last 3-4 months. He thinks it is unintentional. He has been on immunosuppressant medication such as Remicade and Enbrel and sometimes those can be associated with possibility of non-Hodgkin lymphomas. He also have chronic pain and recently saw an orthopedic physician who recommended MRI of the neck which will be scheduled. He is also seeing a rheumatologist next month, a neurologist and a pain specialist. He does complain of left shoulder pain and left elbow pain. There was some discussion by previous rheumatologist in [place] about switching his biological therapy boost to Stelara or [place] Nail once he is evaluated by a rheumatologist. Patient had already received his flu shot as well as pneumonia shot. He also complains of having significant short-term memory problems and trouble concentrating which could be from his medications or TIA episode.  So patient's active hematologic problems are  #1 Protein C deficiency documented twice. #2 TIA/CVA in May 2015 #3 history of DVT in 2007 Right leg treated with Coumadin x 3 months. It was provoked by foot injury #3 Use of immunosuppressant medications which increase the likelihood of lymphomas. 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  CURRENT THERAPY: Xarelto 20 mg once daily,  phlebotomy if hematocrit above 45%   INTERVAL HISTORY: Jesse Bowers returns for follow-up. He tolerated the left knee surgery well 1.5 months ago. His mobility of the left knee has improved. He still on physical therapy, and he uses a walker to walk around. He did restart his Xarelto one week after his knee surgery, and had no issue with bleeding. He had multiple fall at home in the past month, especially at night, due to the balance issues and poor vision and night. He otherwise is doing well, no other new complaints. He recently had a psychological evaluation for his ability to return to work, and was felt not suitable to walk. He is applying for disability now. He is also on testosterone injection, he follows with his endocrinologist Jesse. Loanne Drilling.   MEDICAL HISTORY:   Past Medical History  Diagnosis Date  . psoriatic arthritis   . H/O protein C deficiency   . Hx-TIA (transient ischemic attack)   . Clotting disorder ([place])   . Celiac disease   . H/O parotitis     right   . Polycythemia, secondary   . Transgendered   . Sleep apnea   . Neck pain   . Abnormal weight loss   . Gluten enteropathy   . Syrinx of spinal cord ([place]) 01/06/2014 on MRI    c spine  .  Anxiety     PTSD per patient    SURGICAL HISTORY:  Past Surgical History  Procedure Laterality Date  . Thyroidectomy, partial  2008  . Cholecystectomy    . Ankle arthroscopy with reconstruction Right 2007  . Knee surgery Bilateral 1984    Right ACL, left PCL repair  . Liver biopsy  2013    normal results.  . Hip arthroscopy w/ labral repair Right 05/11/2013    SOCIAL HISTORY:  Social History   Social History  . Marital Status: Married    Spouse Name: N/A  . Number of Children: 2  . 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 Smoking status: Never Smoker   . Smokeless tobacco: Not on file  . Alcohol Use: Yes      Comment: wine once a month  . Drug Use: No  . Sexual Activity: Yes    Birth Control/ Protection: None     Comment: patient is a transgender on testosterone shots, no biological kids   Other Topics Concern  . Not on file   Social History Narrative   Education 4 year college, former Therapist, sports X 15 years, pediatric nurse practitioner x 6 years, did NP degree from [employer] of [place]. Relocated to [place] about 2 months ago from [place], [place]. Patient was in [place] for last 4 years and prior to that in [place]. His wife is working as Scientist, research (physical sciences) for [employer]. Patient is not working and applying for disability. They have 2 kids but no biologic children.     FAMILY HISTORY:  Family History  Problem Relation Age of Onset  . Stroke Paternal Uncle     age 62  . Stroke Maternal Grandfather     66  . Hypertension Mother   . Psoriasis Mother   . Stroke Maternal Grandmother   . Congestive Heart Failure Maternal Grandmother   . Cancer Paternal Grandfather   . Protein C deficiency Sister 36    Miscarriages    ALLERGIES:  is allergic to penicillin g; vancomycin; wheat extract; duloxetine; gabapentin; and sulfa antibiotics.  MEDICATIONS:  Current Outpatient Prescriptions  Medication Sig Dispense Refill  . clorazepate (TRANXENE) 7.5 MG tablet Take one tablet by mouth three times daily (Patient taking differently: Take 7.5 mg by mouth 4 (four) times daily. Take one tablet by mouth three times daily) 90 tablet 0  . cyclobenzaprine (FLEXERIL) 5 MG tablet Take 5 mg by mouth at bedtime.    . diclofenac sodium (VOLTAREN) 1 % GEL Apply 1 application topically 3 (three) times daily. To feet, hands, knees, elbows (Patient taking differently: Apply 1 application topically as needed. To feet, hands, knees, elbows) 3 Tube 4  . morphine (MS CONTIN) 60 MG 12 hr tablet Take 1 tablet (60 mg total) by mouth every 12 (twelve) hours. 36 tablet 0  . Oxycodone HCl 10 MG TABS Take 1 tablet (10 mg total) by  mouth every 8 (eight) hours as needed. 90 tablet 0  . polyethylene glycol (MIRALAX / GLYCOLAX) packet Take 17 g by mouth as needed.     . prazosin (MINIPRESS) 2 MG capsule Take 6 mg by mouth at bedtime.    . rivaroxaban (XARELTO) 20 MG TABS tablet Take 1 tablet (20 mg total) by mouth daily with supper. 30 tablet 5  . Secukinumab 150 MG/ML SOAJ Inject into the skin. Inject every  28 days.    [name] testosterone cypionate (DEPO-TESTOSTERONE) 200 MG/ML injection Inject 50 mg into the muscle once a week.    . venlafaxine (EFFEXOR) 75 MG tablet Take 150 mg by mouth every morning.      No current facility-administered medications for this visit.    REVIEW OF SYSTEMS:   Constitutional: Denies fevers, chills or abnormal night sweats Eyes: Denies blurriness of vision, double vision or watery eyes Ears, nose, mouth, throat, and face: Denies mucositis or sore throat, +Dry mouth Respiratory: Denies cough, dyspnea or wheezes Cardiovascular: Denies palpitation, chest discomfort or lower extremity swelling Gastrointestinal:  Denies nausea, heartburn, + change in bowel habits that is Constipation Skin: Hx of psoriasis and arthropathy from it.  Lymphatics: Denies new lymphadenopathy or easy bruising, hx of right parotitis twice. Neurological:Denies numbness, tingling or new weaknesses, recent TIA March 2015 Behavioral/Psych: Mood is stable, no new changes, +depression and anxiety, +memory problems All other systems were reviewed with the patient and are negative.  PHYSICAL EXAMINATION: ECOG PERFORMANCE STATUS: 2-3  Filed Vitals:   01/29/15 0949  BP: 123/86  Pulse: 103  Temp: 98.4 F (36.9 C)  Resp: 18   GENERAL:alert, no distress and comfortable SKIN: skin color, texture, turgor are normal, no rashes or significant lesions EYES: normal, conjunctiva are pink and non-injected, sclera clear except small bleeding in the inner of right sclera  OROPHARYNX:no exudate, no erythema and lips, buccal mucosa, and  tongue normal  NECK: supple, thyroid normal size, non-tender, without nodularity LYMPH:  no palpable lymphadenopathy in the cervical, axillary or inguinal LUNGS: clear to auscultation and percussion with normal breathing effort HEART: regular rate & rhythm and no murmurs and no lower extremity edema ABDOMEN:abdomen soft, non-tender and normal bowel sounds Musculoskeletal:no cyanosis of digits and no clubbing  PSYCH: alert & oriented x 3 NEURO: no focal motor/sensory deficits EXT: The surgical scar at the left knee has healed well, the range of motion of left knee has improved. No pitting edema.  LABORATORY DATA:  I have reviewed the data as listed CBC Latest Ref Rng 01/29/2015 01/02/2015 11/22/2014  WBC 4.0 - 10.3 10e3/uL 3.3(L) 5.1 3.1(L)  Hemoglobin 13.0 - 17.1 g/dL 13.7 13.3 13.9  Hematocrit 38.4 - 49.9 % 42.4 40.4 42.6  Platelets 140 - 400 10e3/uL 176 193.0 140   CMP Latest Ref Rng 01/29/2015 11/22/2014  Glucose 70 - 140 mg/dl 97 88  BUN 7.0 - 26.0 mg/dL 17.1 13.5  Creatinine 0.7 - 1.3 mg/dL 1.3 1.3  Sodium 136 - 145 mEq/L 138 139  Potassium 3.5 - 5.1 mEq/L 4.8 3.7  CO2 22 - 29 mEq/L 27 30(H)  Calcium 8.4 - 10.4 mg/dL 9.2 9.3  Total Protein 6.4 - 8.3 g/dL 6.9 6.8  Total Bilirubin 0.20 - 1.20 mg/dL 0.39 0.70  Alkaline Phos 40 - 150 U/L 250(H) 282(H)  AST 5 - 34 U/L 39(H) 46(H)  ALT 0 - 55 U/L 28 54     Pathology report Diagnosis Bone Marrow, Aspirate,Biopsy, and Clot, right iliac - SLIGHTLY HYPERCELLULAR BONE MARROW FOR AGE WITH TRILINEAGE HEMATOPOIESIS. - SEE COMMENT. PERIPHERAL BLOOD: - NO SIGNIFICANT MORPHOLOGIC ABNORMALITIES. Diagnosis Note The bone marrow is slightly hypercellular with trilineage hematopoiesis and essentially orderly and progressive maturation of all cell types. Significant dyspoiesis is not present. Iron stores are markedly decreased to absent and hence correlation with serum iron studies is recommended. There is no morphologic evidence of a  myeloproliferative process in this material. Correlation with cytogenetic studies is recommended. (BNS:ecj 06-May-2014)  RADIOGRAPHIC STUDIES: No results found.  ASSESSMENT & PLAN:   1. 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 He has slightly hypercellular bone marrow with trilineage hematopoiesis.  No morphology evidence of myeloproliferative process. Cytogenetics was normal (no Y chromosome).  JAK2 mutation was negative. -He had Hemoglobin 18.7 hematocrit 53.1 in 04/2013. His able level is normal. His presentation is consistent with secondary polycythemia, likely secondary to testosterone injections. 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 He does have hot flash and other symptoms without testosterone injection. I recommend him to maintain his testosterone level at low normal limits, and he agrees.  -We discussed that secondary polycythemia is usually benign, risk of thrombosis is relatively low than PV. However he did have history of stroke, and he feels fatigued when his hemoglobin is high. He agrees to continue phlebotomy as needed. -He developed anemia after the surgery secondary to blood loss, improved, hemoglobin 13.7 today, hematocrit 42.4%. No need for phlebotomy before surgery  2. History of DVT and stroke, Protein C deficiency  - continue Xarelto indefinitely given his underlying protein C deficiency and history of DVT and stroke. His previous DVT was provoked by foot injury. -Continue Xarelto indefinitely. Okay to hold for 1-2 days before joint injection by his rheumatologist.  3. History of CVA -His primary care physician is setting up a referral to a neurologist at [facility]. He will follow  4. 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  -We discussed that excessive testosterone would increase the risk of thrombosis and worsen his polycythemia. I prefer him to maintain low normal level of testosterone. He agrees. He will follow-up with Jesse. Loanne Drilling   5. RA -He is on Secukinumab, follow-up with Jesse. Loanne Drilling   Recommendations -continue Xarelto -CBC every 2 months and phlebotomy if hematocrit above 45% -I'll see him in back in 4 months  All questions were answered. The patient knows to call the clinic with any problems, questions or concerns.  I spent 20 minutes counseling the patient face to face. The total time spent in the appointment was 25 minutes and more than 50% was on counseling.   [name]  01/29/2015

## 2015-01-30 ENCOUNTER — Encounter
Payer: BLUE CROSS/BLUE SHIELD | Attending: Physical Medicine & Rehabilitation | Admitting: Physical Medicine & Rehabilitation

## 2015-01-30 ENCOUNTER — Ambulatory Visit (INDEPENDENT_AMBULATORY_CARE_PROVIDER_SITE_OTHER): Payer: BLUE CROSS/BLUE SHIELD | Admitting: Psychology

## 2015-01-30 ENCOUNTER — Encounter: Payer: Self-pay | Admitting: Physical Medicine & Rehabilitation

## 2015-01-30 VITALS — BP 133/94 | HR 96 | Resp 14

## 2015-01-30 DIAGNOSIS — Z7901 Long term (current) use of anticoagulants: Secondary | ICD-10-CM | POA: Insufficient documentation

## 2015-01-30 DIAGNOSIS — K900 Celiac disease: Secondary | ICD-10-CM | POA: Insufficient documentation

## 2015-01-30 DIAGNOSIS — Z9181 History of falling: Secondary | ICD-10-CM | POA: Insufficient documentation

## 2015-01-30 DIAGNOSIS — M7522 Bicipital tendinitis, left shoulder: Secondary | ICD-10-CM | POA: Insufficient documentation

## 2015-01-30 DIAGNOSIS — S43002A Unspecified subluxation of left shoulder joint, initial encounter: Secondary | ICD-10-CM | POA: Diagnosis not present

## 2015-01-30 DIAGNOSIS — Z79891 Long term (current) use of opiate analgesic: Secondary | ICD-10-CM | POA: Insufficient documentation

## 2015-01-30 DIAGNOSIS — D751 Secondary polycythemia: Secondary | ICD-10-CM | POA: Insufficient documentation

## 2015-01-30 DIAGNOSIS — M13862 Other specified arthritis, left knee: Secondary | ICD-10-CM | POA: Diagnosis not present

## 2015-01-30 DIAGNOSIS — I69320 Aphasia following cerebral infarction: Secondary | ICD-10-CM | POA: Diagnosis not present

## 2015-01-30 DIAGNOSIS — M545 Low back pain: Secondary | ICD-10-CM | POA: Insufficient documentation

## 2015-01-30 DIAGNOSIS — Z79899 Other long term (current) drug therapy: Secondary | ICD-10-CM | POA: Insufficient documentation

## 2015-01-30 DIAGNOSIS — I69398 Other sequelae of cerebral infarction: Secondary | ICD-10-CM | POA: Diagnosis not present

## 2015-01-30 DIAGNOSIS — R209 Unspecified disturbances of skin sensation: Secondary | ICD-10-CM | POA: Diagnosis not present

## 2015-01-30 DIAGNOSIS — D6859 Other primary thrombophilia: Secondary | ICD-10-CM | POA: Diagnosis not present

## 2015-01-30 DIAGNOSIS — M7712 Lateral epicondylitis, left elbow: Secondary | ICD-10-CM

## 2015-01-30 DIAGNOSIS — Z96652 Presence of left artificial knee joint: Secondary | ICD-10-CM

## 2015-01-30 DIAGNOSIS — G8929 Other chronic pain: Secondary | ICD-10-CM | POA: Diagnosis present

## 2015-01-30 DIAGNOSIS — F418 Other specified anxiety disorders: Secondary | ICD-10-CM | POA: Insufficient documentation

## 2015-01-30 DIAGNOSIS — L4050 Arthropathic psoriasis, unspecified: Secondary | ICD-10-CM | POA: Diagnosis not present

## 2015-01-30 DIAGNOSIS — F4310 Post-traumatic stress disorder, unspecified: Secondary | ICD-10-CM | POA: Diagnosis not present

## 2015-01-30 DIAGNOSIS — I69351 Hemiplegia and hemiparesis following cerebral infarction affecting right dominant side: Secondary | ICD-10-CM | POA: Diagnosis not present

## 2015-01-30 DIAGNOSIS — G8101 Flaccid hemiplegia affecting right dominant side: Secondary | ICD-10-CM

## 2015-01-30 DIAGNOSIS — Z86718 Personal history of other venous thrombosis and embolism: Secondary | ICD-10-CM | POA: Diagnosis not present

## 2015-01-30 DIAGNOSIS — Z87890 Personal history of sex reassignment: Secondary | ICD-10-CM | POA: Insufficient documentation

## 2015-01-30 MED ORDER — OXYCODONE HCL 10 MG PO TABS: 10.0000 mg | ORAL_TABLET | Freq: Three times a day (TID) | ORAL | Status: DC | PRN

## 2015-01-30 MED ORDER — MORPHINE SULFATE ER 60 MG PO CP24: 60.0000 mg | ORAL_CAPSULE | Freq: Every day | ORAL | Status: DC

## 2015-01-30 MED ORDER — MORPHINE SULFATE ER 60 MG PO CP24: 60.0000 mg | ORAL_CAPSULE | Freq: Two times a day (BID) | ORAL | Status: DC

## 2015-01-30 MED ORDER — NONFORMULARY OR COMPOUNDED ITEM: 60.0000 g | Freq: Three times a day (TID) | Status: DC

## 2015-01-30 NOTE — Progress Notes (Signed)
Subjective:    Patient ID: Jesse Bowers, male    DOB: 10-30-1965, 49 y.o.   MRN: [id number]  HPI   Jesse Bowers is back regarding his chronic pain. He did not tolerate the opana, so we changed him back to the ms contin which was rxed 80m q12---he is having a lot of mid day pain due to the change. He continues on his new rheum medication which is beginning to help some of his stiffness. He is going to therapy to address his mobility, gait, and strength, and he has been to Dr. ZValentina Shaggy(neuropsychology) who's recommended ongoing cognitive and behavioral mgt to address his PTSD sx, cognition, and pain.   He had very good results with recent shoulder injections done here. He also had his left shoulder injected by rheumatology which helped a lot as well.   Pain Inventory Average Pain 8 Pain Right Now 9 My pain is constant, sharp, stabbing and aching  In the last 24 hours, has pain interfered with the following? General activity 10 Relation with others 9 Enjoyment of life 9 What TIME of day is your pain at its worst? morning, evening, night Sleep (in general) Poor  Pain is worse with: walking, sitting, standing and some activites Pain improves with: heat/ice and medication Relief from Meds: 1  Mobility use a walker how many minutes can you walk? 5-10 ability to climb steps?  yes do you drive?  no use a wheelchair Do you have any goals in this area?  no  Function disabled: date disabled na I need assistance with the following:  feeding, bathing, meal prep, household duties and shopping Do you have any goals in this area?  no  Neuro/Psych weakness numbness tingling anxiety  Prior Studies Any changes since last visit?  no  Physicians involved in your care Any changes since last visit?  no   Family History  Problem Relation Age of Onset  . Stroke Paternal Uncle     age 49 . Stroke Maternal Grandfather     552 . Hypertension Mother   . Psoriasis Mother   . Stroke Maternal  Grandmother   . Congestive Heart Failure Maternal Grandmother   . Cancer Paternal Grandfather   . Protein C deficiency Sister 38    Miscarriages  Social History   Social History  . Marital Status: Married    Spouse Name: N/A  . Number of Children: 2  . 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 Smoking status: Never Smoker   . Smokeless tobacco: None  . Alcohol Use: Yes     Comment: wine once a month  . Drug Use: No  . Sexual Activity: Yes    Birth Control/ Protection: None     Comment: patient is a transgender on testosterone shots, no biological kids   Other Topics Concern  . 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 Patient was in [place] for last 4 years and [place] in [place] His wife is working as HScientist, research (physical sciences)for [employer] Patient is not working and applying for disability. They have 2 kids but no biologic children.    Past Surgical History  Procedure Laterality Date  . Thyroidectomy, partial  2008  . Cholecystectomy    . Ankle arthroscopy with reconstruction Right 2007  . Knee surgery Bilateral 1984    Right ACL, left PCL repair  . Liver biopsy  2013    normal results.  . Hip arthroscopy w/ labral repair Right 05/11/2013   Past Medical History  Diagnosis Date  . psoriatic arthritis   . H/O protein C deficiency   . Hx-TIA (transient ischemic attack)   . Clotting disorder ([place])   . Celiac disease   . H/O parotitis     right   . Polycythemia, secondary   . Transgendered   . Sleep apnea   . Neck pain   . Abnormal weight loss   . Gluten enteropathy   . Syrinx of spinal cord ([place]) 01/06/2014 on MRI    c spine  . Anxiety     PTSD per patient   BP 133/94 mmHg   Pulse 96  Resp 14  SpO2 99%  Opioid Risk Score:   Fall Risk Score:  `1  Depression screen PHQ 2/9  Depression screen PHQ 2/9 06/12/2014  Decreased Interest 2  Down, Depressed, Hopeless 0  PHQ - 2 Score 2  Altered sleeping 3  Tired, decreased energy 3  Change in appetite 0  Feeling bad or failure about yourself  0  Trouble concentrating 3  Moving slowly or fidgety/restless 0  Suicidal thoughts 0  PHQ-9 Score 11     Review of Systems  Musculoskeletal: Positive for gait problem.  Neurological: Positive for weakness and numbness.       Tingling  Psychiatric/Behavioral: The patient is nervous/anxious.   All other systems reviewed and are negative.      Objective:   Physical Exam General: Alert and oriented x 3, No apparent distress  HEENT: Head is normocephalic, atraumatic, PERRLA, EOMI, sclera anicteric, oral mucosa pink and moist, dentition intact, ext ear canals clear,  Neck: Supple without JVD or lymphadenopathy  Heart: Reg rate and rhythm. No murmurs rubs or gallops  Chest: CTA bilaterally without wheezes, rales, or rhonchi; no distress  Abdomen: Soft, non-tender, non-distended, bowel sounds positive.  Extremities: No clubbing, cyanosis, or edema. Pulses are 2+  Skin: Clean and intact without signs of breakdown. Left TKA scar.  Neuro: Pt is pleasant and alert and oriented x 3. He stutters and has word finding deficits. He is able to converse intelligently and has good insight and awareness. His short term memory is functional on a conversational level. Mild right facial weakness and sensory loss is appreciated. UES: 4+ to 5/5 LUE with some give away at the shoulder. RUE: 4- to 4 deltoid, bicep, tricep, wrist. Grip is weaker bilaterally due to pain. RLE: 3+ hf, 4- ke and ankle df. LE: 4+/5. Sensory loss 1+/2 Right face, hand, and leg. He is wearing his right AFO. --no changes today Musculoskeletal: shoulders less tender with palpation and ROM today.  Left knee ROM much  improved. Surgical scar healing. .Right lumbar paraspinals are tight and more tender with palpation. His low back is slightly rotated clockwise at baseline. Head position is forward and somewhat slumped. He has tenderness along left common extensor tendon of the elbow into the olecranon bursa.  Psych: Pt's affect is appropriate. Pt is cooperative    Assessment & Plan:   1. Psoriatic arthritis with pain in multiple areas, most prominently feet, hands, elbows.  2. 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  3. Patello-femoral arthritis left knee  4. Chronic mid to  low back pain---likely related to altered gait pattern from CVA, hx ofTestosterone deficiency 5. Protein C deficiency  6. Left shoulder subluxation, bicipital tendonitis  7. Central sleep apnea  8. Polycythemia  9. Depression with anxiety  10. Left lateral epidondylitis   Plan:  1. Rheumatological treatment per rheum MD  2. Continue PT for gait technique, pain mgt--  3. Continue voltaren gel to hands, feet, knees, elbows if ok with H/O . Wrote an rx for custom compounded cream.  4. Knee rehab per ortho  5. Will change to kadian 33m q12 in place of ms contin 6. Follow up with me or NP in about a month. 25 minutes of face to face patient care time were spent during this visit. All questions were encouraged and answered.

## 2015-01-30 NOTE — Patient Instructions (Signed)
[facility] or [facility] pharmacy for compounded cream

## 2015-02-01 ENCOUNTER — Ambulatory Visit (INDEPENDENT_AMBULATORY_CARE_PROVIDER_SITE_OTHER): Payer: BLUE CROSS/BLUE SHIELD | Admitting: Psychology

## 2015-02-01 DIAGNOSIS — F4310 Post-traumatic stress disorder, unspecified: Secondary | ICD-10-CM | POA: Diagnosis not present

## 2015-02-02 ENCOUNTER — Telehealth: Payer: Self-pay

## 2015-02-02 NOTE — Telephone Encounter (Signed)
Pt had questions regarding his Morphine script. He states that the script says to take his medication at 12:00 noon and 4:00pm. Pt states that he can not take his Morphine at 4:00 pm due to picking up his kids. Can times be adjusted?

## 2015-02-02 NOTE — Telephone Encounter (Signed)
Not sure why the rx came out that way. The kadian should be taken 12 hours apart. i will send him an email.

## 2015-02-06 ENCOUNTER — Ambulatory Visit: Payer: BLUE CROSS/BLUE SHIELD | Admitting: Psychology

## 2015-02-07 DIAGNOSIS — M7700 Medial epicondylitis, unspecified elbow: Secondary | ICD-10-CM | POA: Insufficient documentation

## 2015-02-08 ENCOUNTER — Ambulatory Visit (INDEPENDENT_AMBULATORY_CARE_PROVIDER_SITE_OTHER): Payer: BLUE CROSS/BLUE SHIELD | Admitting: Psychology

## 2015-02-08 DIAGNOSIS — F4310 Post-traumatic stress disorder, unspecified: Secondary | ICD-10-CM | POA: Diagnosis not present

## 2015-02-13 ENCOUNTER — Ambulatory Visit (INDEPENDENT_AMBULATORY_CARE_PROVIDER_SITE_OTHER): Payer: BLUE CROSS/BLUE SHIELD | Admitting: Psychology

## 2015-02-13 DIAGNOSIS — F4310 Post-traumatic stress disorder, unspecified: Secondary | ICD-10-CM | POA: Diagnosis not present

## 2015-02-15 ENCOUNTER — Ambulatory Visit (INDEPENDENT_AMBULATORY_CARE_PROVIDER_SITE_OTHER): Payer: BLUE CROSS/BLUE SHIELD | Admitting: Psychology

## 2015-02-15 DIAGNOSIS — F4310 Post-traumatic stress disorder, unspecified: Secondary | ICD-10-CM | POA: Diagnosis not present

## 2015-02-20 ENCOUNTER — Ambulatory Visit (INDEPENDENT_AMBULATORY_CARE_PROVIDER_SITE_OTHER): Payer: BLUE CROSS/BLUE SHIELD | Admitting: Psychology

## 2015-02-20 DIAGNOSIS — F4310 Post-traumatic stress disorder, unspecified: Secondary | ICD-10-CM | POA: Diagnosis not present

## 2015-02-22 ENCOUNTER — Ambulatory Visit (INDEPENDENT_AMBULATORY_CARE_PROVIDER_SITE_OTHER): Payer: BLUE CROSS/BLUE SHIELD | Admitting: Psychology

## 2015-02-22 DIAGNOSIS — F4310 Post-traumatic stress disorder, unspecified: Secondary | ICD-10-CM | POA: Diagnosis not present

## 2015-02-28 ENCOUNTER — Other Ambulatory Visit: Payer: Self-pay | Admitting: Registered Nurse

## 2015-02-28 ENCOUNTER — Encounter: Payer: Self-pay | Admitting: Registered Nurse

## 2015-02-28 ENCOUNTER — Encounter: Payer: BLUE CROSS/BLUE SHIELD | Attending: Physical Medicine & Rehabilitation | Admitting: Registered Nurse

## 2015-02-28 VITALS — BP 122/85 | HR 116

## 2015-02-28 DIAGNOSIS — G8929 Other chronic pain: Secondary | ICD-10-CM | POA: Insufficient documentation

## 2015-02-28 DIAGNOSIS — Z96652 Presence of left artificial knee joint: Secondary | ICD-10-CM | POA: Diagnosis not present

## 2015-02-28 DIAGNOSIS — D751 Secondary polycythemia: Secondary | ICD-10-CM | POA: Diagnosis not present

## 2015-02-28 DIAGNOSIS — Z9181 History of falling: Secondary | ICD-10-CM | POA: Diagnosis not present

## 2015-02-28 DIAGNOSIS — F418 Other specified anxiety disorders: Secondary | ICD-10-CM | POA: Diagnosis not present

## 2015-02-28 DIAGNOSIS — G894 Chronic pain syndrome: Secondary | ICD-10-CM | POA: Diagnosis not present

## 2015-02-28 DIAGNOSIS — Z7901 Long term (current) use of anticoagulants: Secondary | ICD-10-CM | POA: Insufficient documentation

## 2015-02-28 DIAGNOSIS — G8101 Flaccid hemiplegia affecting right dominant side: Secondary | ICD-10-CM

## 2015-02-28 DIAGNOSIS — M545 Low back pain: Secondary | ICD-10-CM | POA: Insufficient documentation

## 2015-02-28 DIAGNOSIS — D6859 Other primary thrombophilia: Secondary | ICD-10-CM | POA: Insufficient documentation

## 2015-02-28 DIAGNOSIS — L4050 Arthropathic psoriasis, unspecified: Secondary | ICD-10-CM

## 2015-02-28 DIAGNOSIS — I69398 Other sequelae of cerebral infarction: Secondary | ICD-10-CM | POA: Insufficient documentation

## 2015-02-28 DIAGNOSIS — K900 Celiac disease: Secondary | ICD-10-CM | POA: Diagnosis not present

## 2015-02-28 DIAGNOSIS — M13862 Other specified arthritis, left knee: Secondary | ICD-10-CM | POA: Insufficient documentation

## 2015-02-28 DIAGNOSIS — I69320 Aphasia following cerebral infarction: Secondary | ICD-10-CM | POA: Insufficient documentation

## 2015-02-28 DIAGNOSIS — Z79899 Other long term (current) drug therapy: Secondary | ICD-10-CM | POA: Diagnosis not present

## 2015-02-28 DIAGNOSIS — Z86718 Personal history of other venous thrombosis and embolism: Secondary | ICD-10-CM | POA: Insufficient documentation

## 2015-02-28 DIAGNOSIS — Z87890 Personal history of sex reassignment: Secondary | ICD-10-CM | POA: Diagnosis not present

## 2015-02-28 DIAGNOSIS — M7712 Lateral epicondylitis, left elbow: Secondary | ICD-10-CM

## 2015-02-28 DIAGNOSIS — Z5181 Encounter for therapeutic drug level monitoring: Secondary | ICD-10-CM

## 2015-02-28 DIAGNOSIS — Z79891 Long term (current) use of opiate analgesic: Secondary | ICD-10-CM | POA: Diagnosis not present

## 2015-02-28 DIAGNOSIS — I69351 Hemiplegia and hemiparesis following cerebral infarction affecting right dominant side: Secondary | ICD-10-CM | POA: Diagnosis not present

## 2015-02-28 DIAGNOSIS — R209 Unspecified disturbances of skin sensation: Secondary | ICD-10-CM | POA: Diagnosis not present

## 2015-02-28 DIAGNOSIS — M7522 Bicipital tendinitis, left shoulder: Secondary | ICD-10-CM

## 2015-02-28 DIAGNOSIS — S43002A Unspecified subluxation of left shoulder joint, initial encounter: Secondary | ICD-10-CM | POA: Diagnosis not present

## 2015-02-28 MED ORDER — OXYCODONE HCL 10 MG PO TABS: 10.0000 mg | ORAL_TABLET | Freq: Three times a day (TID) | ORAL | Status: DC | PRN

## 2015-02-28 MED ORDER — MORPHINE SULFATE ER 60 MG PO CP24: 60.0000 mg | ORAL_CAPSULE | Freq: Two times a day (BID) | ORAL | Status: DC

## 2015-02-28 NOTE — Progress Notes (Signed)
Subjective:    Patient ID: Jesse Bowers, male    DOB: [birth year], 50 y.o.   MRN: [id number]  HPI: [name] is a 49 year old male who returns for follow-up and medication refill. He says his pain is located in his left elbow, left knee and bilateral feet. He rates his pain 7. His current exercise regime is performing Yoga three times a week and Physical therapy twice a week. He was prescribed a custom compound care last month he will be picking it up today.  Pain Inventory Average Pain 9 Pain Right Now 7 My pain is constant, sharp, stabbing and aching  In the last 24 hours, has pain interfered with the following? General activity 8 Relation with others 9 Enjoyment of life 9 What TIME of day is your pain at its worst? Morning and Night Sleep (in general) Fair  Pain is worse with: walking, bending, sitting, standing and some activites Pain improves with: rest, heat/ice, medication and injections Relief from Meds: 7  Mobility walk with assistance use a walker ability to climb steps?  yes do you drive?  no use a wheelchair transfers alone Do you have any goals in this area?  yes  Function disabled: date disabled 01/18/13 I need assistance with the following:  feeding, dressing, bathing, meal prep, household duties and shopping Do you have any goals in this area?  yes  Neuro/Psych weakness trouble walking spasms confusion anxiety  Prior Studies Any changes since last visit?  no  Physicians involved in your care Any changes since last visit?  no   Family History  Problem Relation Age of Onset  . Stroke Paternal Uncle     age 35  . Stroke Maternal Grandfather     47  . Hypertension Mother   . Psoriasis Mother   . Stroke Maternal Grandmother   . Congestive Heart Failure Maternal Grandmother   . Cancer Paternal Grandfather   . Protein C deficiency Sister 8    Miscarriages   Social History   Social History  . Marital Status: Married    Spouse Name:  N/A  . Number of Children: 2  . 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 Smoking status: Never Smoker   . Smokeless tobacco: None  . Alcohol Use: Yes     Comment: wine once a month  . Drug Use: No  . Sexual Activity: Yes    Birth Control/ Protection: None     Comment: patient is a transgender on testosterone shots, no biological kids   Other Topics Concern  . 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 Relocated to [place] about 2 months ago from [place]. Patient was in [place] for last 4 years and prior to that in [place]. His wife is working as Scientist, research (physical sciences) for [employer]. Patient is not working and applying for disability. They have 2 kids but no biologic children.    Past Surgical History  Procedure Laterality Date  . Thyroidectomy, partial  2008  . Cholecystectomy    . Ankle arthroscopy with reconstruction Right 2007  . Knee surgery Bilateral 1984    Right ACL, left PCL repair  . Liver biopsy  2013    normal results.  [name]  Hip arthroscopy w/ labral repair Right 05/11/2013   Past Medical History  Diagnosis Date  . psoriatic arthritis   . H/O protein C deficiency   . Hx-TIA (transient ischemic attack)   . Clotting disorder ([place])   . Celiac disease   . H/O parotitis     right   . Polycythemia, secondary   . Transgendered   . Sleep apnea   . Neck pain   . Abnormal weight loss   . Gluten enteropathy   . Syrinx of spinal cord ([place]) 01/06/2014 on MRI    c spine  . Anxiety     PTSD per patient   BP 122/85 mmHg  Pulse 116  SpO2 96%  Opioid Risk Score:   Fall Risk Score:  `1  Depression screen PHQ 2/9  Depression screen PHQ 2/9 06/12/2014  Decreased Interest 2  Down, Depressed, Hopeless 0  PHQ - 2 Score 2  Altered sleeping 3    Tired, decreased energy 3  Change in appetite 0  Feeling bad or failure about yourself  0  Trouble concentrating 3  Moving slowly or fidgety/restless 0  Suicidal thoughts 0  PHQ-9 Score 11      Review of Systems  Musculoskeletal: Positive for gait problem.       Spasms  Neurological: Positive for weakness.  Psychiatric/Behavioral: Positive for confusion. The patient is nervous/anxious.        Depression  All other systems reviewed and are negative.      Objective:   Physical Exam  Constitutional: He is oriented to person, place, and time. He appears well-developed and well-nourished.  HENT:  Head: Normocephalic and atraumatic.  Neck: Normal range of motion. Neck supple.  Cardiovascular: Normal rate and regular rhythm.   Pulmonary/Chest: Effort normal and breath sounds normal.  Musculoskeletal:  Normal Muscle Bulk and Muscle Testing Reveals: Upper Extremities:Right Full ROM and Muscle Strength on the Right 4/5 and Left  Decreased ROM 90 Degrees and Muscle Strength 3/5 Bilateral AC Joint Tenderness Lower Extremities: Full ROM and Muscle Strength 5/5 Left Lower Extremity Flexion Produces Pain into Popliteal Fossa Arises from chair slowly/ using walker for support Antalgic Gait   Neurological: He is alert and oriented to person, place, and time.  Skin: Skin is warm and dry.  Psychiatric: He has a normal mood and affect.  Nursing note and vitals reviewed.         Assessment & Plan:  1. Psoriatic arthritis with pain in multiple areas, most prominently feet, hands, elbows.: Refilled:Kadian  60 mg 24 hr. Capsule, one capsule every 12 hours #60 and Oxycodone 10 mg one tablet every 8 hours as needed #90. Rheumatology Following. 2. 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 Patello-femoral arthritis left knee: Continue Voltaren Gel S/P TKR on  07/03/14:  4. Chronic mid- low back pain: Continue current medication regime, and encourage to increase activity as tolerated. 5. Polycythemia: Oncology Following.  6. Depression with anxiety : Continue Effexor   30 minutes of face to face patient care time was spent during this visit. All questions were encouraged and answered.

## 2015-03-01 LAB — PMP ALCOHOL METABOLITE (ETG): ETGU: NEGATIVE ng/mL

## 2015-03-02 LAB — OPIATES/OPIOIDS (LC/MS-MS)
Codeine Urine: NEGATIVE ng/mL (ref <50)
HYDROMORPHONE: 151 ng/mL (ref <50)
Hydrocodone: NEGATIVE ng/mL (ref <50)
MORPHINE: 44489 ng/mL (ref <50)
NORHYDROCODONE, UR: NEGATIVE ng/mL (ref <50)
NOROXYCODONE, UR: 1794 ng/mL (ref <50)
OXYCODONE, UR: 831 ng/mL (ref <50)
Oxymorphone: 1083 ng/mL (ref <50)

## 2015-03-02 LAB — BENZODIAZEPINES (GC/LC/MS), URINE
ALPRAZOLAMU: NEGATIVE ng/mL (ref <25)
CLONAZEPAU: NEGATIVE ng/mL (ref <25)
FLURAZEPAMU: NEGATIVE ng/mL (ref <50)
Lorazepam (GC/LC/MS), ur confirm: NEGATIVE ng/mL (ref <50)
MIDAZOLAMU: NEGATIVE ng/mL (ref <50)
Nordiazepam (GC/LC/MS), ur confirm: 281 ng/mL — AB (ref <50)
Oxazepam (GC/LC/MS), ur confirm: 3000 ng/mL — AB (ref <50)
TRIAZOLAMU: NEGATIVE ng/mL (ref <50)
Temazepam (GC/LC/MS), ur confirm: NEGATIVE ng/mL (ref <50)

## 2015-03-02 LAB — OXYCODONE, URINE (LC/MS-MS)
Noroxycodone, Ur: 1794 ng/mL (ref <50)
OXYMORPHONE, URINE: 1083 ng/mL (ref <50)
Oxycodone, ur: 831 ng/mL (ref <50)

## 2015-03-03 LAB — PRESCRIPTION MONITORING PROFILE (SOLSTAS)
AMPHETAMINE/METH: NEGATIVE ng/mL
Barbiturate Screen, Urine: NEGATIVE ng/mL
Buprenorphine, Urine: NEGATIVE ng/mL
CARISOPRODOL, URINE: NEGATIVE ng/mL
Cannabinoid Scrn, Ur: NEGATIVE ng/mL
Cocaine Metabolites: NEGATIVE ng/mL
Creatinine, Urine: 79.06 mg/dL (ref >20.0)
Fentanyl, Ur: NEGATIVE ng/mL
MDMA URINE: NEGATIVE ng/mL
Meperidine, Ur: NEGATIVE ng/mL
Methadone Screen, Urine: NEGATIVE ng/mL
NITRITES URINE, INITIAL: NEGATIVE ug/mL
PH URINE, INITIAL: 5.8 pH (ref 4.5–8.9)
PROPOXYPHENE: NEGATIVE ng/mL
TRAMADOL UR: NEGATIVE ng/mL
Tapentadol, urine: NEGATIVE ng/mL
Zolpidem, Urine: NEGATIVE ng/mL

## 2015-03-09 ENCOUNTER — Telehealth: Payer: Self-pay

## 2015-03-09 MED ORDER — CYCLOBENZAPRINE HCL 5 MG PO TABS: 5.0000 mg | ORAL_TABLET | Freq: Every day | ORAL | Status: DC

## 2015-03-09 NOTE — Telephone Encounter (Signed)
Pt called requesting a one time refill on his Flexeril 22m. Neurology is closed and would like ET to fill. Consulted with ET about refill, and she was okay with a 30 day supply.

## 2015-03-13 ENCOUNTER — Ambulatory Visit (INDEPENDENT_AMBULATORY_CARE_PROVIDER_SITE_OTHER): Payer: BLUE CROSS/BLUE SHIELD | Admitting: Psychology

## 2015-03-13 DIAGNOSIS — F4310 Post-traumatic stress disorder, unspecified: Secondary | ICD-10-CM | POA: Diagnosis not present

## 2015-03-15 ENCOUNTER — Ambulatory Visit (INDEPENDENT_AMBULATORY_CARE_PROVIDER_SITE_OTHER): Payer: BLUE CROSS/BLUE SHIELD | Admitting: Psychology

## 2015-03-15 DIAGNOSIS — F4310 Post-traumatic stress disorder, unspecified: Secondary | ICD-10-CM | POA: Diagnosis not present

## 2015-03-20 ENCOUNTER — Ambulatory Visit (INDEPENDENT_AMBULATORY_CARE_PROVIDER_SITE_OTHER): Payer: BLUE CROSS/BLUE SHIELD | Admitting: Psychology

## 2015-03-20 DIAGNOSIS — F4310 Post-traumatic stress disorder, unspecified: Secondary | ICD-10-CM

## 2015-03-21 ENCOUNTER — Telehealth: Payer: Self-pay | Admitting: *Deleted

## 2015-03-21 NOTE — Telephone Encounter (Signed)
Compounded medication has caused him to break out in [employer] rash, face ,mouth, hands and feet (peeling), but face and mouth is worse.  It was working great but now cannot use it. (sulfa allergy?) Is there something else Dr [name] can come up with?

## 2015-03-22 ENCOUNTER — Ambulatory Visit (INDEPENDENT_AMBULATORY_CARE_PROVIDER_SITE_OTHER): Payer: BLUE CROSS/BLUE SHIELD | Admitting: Psychology

## 2015-03-22 DIAGNOSIS — F4310 Post-traumatic stress disorder, unspecified: Secondary | ICD-10-CM

## 2015-03-23 NOTE — Telephone Encounter (Signed)
What part of it does he think is allergic to?  Ketoprofen??  We could omit this or try something different.  It is odd that he's been taking it this long before he developed a rash?

## 2015-03-23 NOTE — Telephone Encounter (Signed)
Left message to call back  

## 2015-03-23 NOTE — Telephone Encounter (Signed)
Patient is having an allergic reaction to compound medication that Dr. [name] prescribed to him.  Please call.

## 2015-03-26 ENCOUNTER — Telehealth: Payer: Self-pay | Admitting: Hematology

## 2015-03-26 ENCOUNTER — Ambulatory Visit (HOSPITAL_BASED_OUTPATIENT_CLINIC_OR_DEPARTMENT_OTHER): Payer: BLUE CROSS/BLUE SHIELD | Admitting: Hematology

## 2015-03-26 ENCOUNTER — Other Ambulatory Visit (HOSPITAL_BASED_OUTPATIENT_CLINIC_OR_DEPARTMENT_OTHER): Payer: BLUE CROSS/BLUE SHIELD

## 2015-03-26 ENCOUNTER — Encounter: Payer: Self-pay | Admitting: Hematology

## 2015-03-26 VITALS — BP 132/89 | HR 120 | Temp 98.7°F | Resp 18 | Ht 68.0 in | Wt 219.8 lb

## 2015-03-26 DIAGNOSIS — D751 Secondary polycythemia: Secondary | ICD-10-CM

## 2015-03-26 DIAGNOSIS — Z86718 Personal history of other venous thrombosis and embolism: Secondary | ICD-10-CM

## 2015-03-26 DIAGNOSIS — I82409 Acute embolism and thrombosis of unspecified deep veins of unspecified lower extremity: Secondary | ICD-10-CM

## 2015-03-26 LAB — CBC WITH DIFFERENTIAL/PLATELET
BASO%: 0.1 % (ref 0.0–2.0)
BASOS ABS: 0 10*3/uL (ref 0.0–0.1)
EOS%: 0 % (ref 0.0–7.0)
Eosinophils Absolute: 0 10*3/uL (ref 0.0–0.5)
HCT: 42.7 % (ref 38.4–49.9)
HEMOGLOBIN: 13.9 g/dL (ref 13.0–17.1)
LYMPH%: 27.6 % (ref 14.0–49.0)
MCH: 27.2 pg (ref 27.2–33.4)
MCHC: 32.6 g/dL (ref 32.0–36.0)
MCV: 83.4 fL (ref 79.3–98.0)
MONO#: 0.4 10*3/uL (ref 0.1–0.9)
MONO%: 8.9 % (ref 0.0–14.0)
NEUT#: 3.2 10*3/uL (ref 1.5–6.5)
NEUT%: 63.4 % (ref 39.0–75.0)
Platelets: 191 10*3/uL (ref 140–400)
RBC: 5.12 10*6/uL (ref 4.20–5.82)
RDW: 14.2 % (ref 11.0–14.6)
WBC: 5.1 10*3/uL (ref 4.0–10.3)
lymph#: 1.4 10*3/uL (ref 0.9–3.3)

## 2015-03-26 LAB — COMPREHENSIVE METABOLIC PANEL
ALBUMIN: 4.1 g/dL (ref 3.5–5.0)
ALT: 29 U/L (ref 0–55)
AST: 21 U/L (ref 5–34)
Alkaline Phosphatase: 229 U/L — ABNORMAL HIGH (ref 40–150)
Anion Gap: 8 mEq/L (ref 3–11)
BUN: 16.5 mg/dL (ref 7.0–26.0)
CHLORIDE: 104 meq/L (ref 98–109)
CO2: 28 mEq/L (ref 22–29)
Calcium: 9.1 mg/dL (ref 8.4–10.4)
Creatinine: 1.3 mg/dL (ref 0.7–1.3)
EGFR: 63 mL/min/{1.73_m2} — ABNORMAL LOW (ref >90)
GLUCOSE: 108 mg/dL (ref 70–140)
POTASSIUM: 4.1 meq/L (ref 3.5–5.1)
SODIUM: 140 meq/L (ref 136–145)
Total Bilirubin: 0.38 mg/dL (ref 0.20–1.20)
Total Protein: 6.8 g/dL (ref 6.4–8.3)

## 2015-03-26 NOTE — Telephone Encounter (Signed)
Mr. Keetch is convinced that it is the sulfa medication.  He states that if it is not the Sulfa it could be his reaction to gabapentin and is asking if a compound cream could be ordered without the offending medication.

## 2015-03-26 NOTE — Telephone Encounter (Signed)
Pt confirmed labs/ov per 03/26/15 POF, pt declined AVS and Calendar.... KJ

## 2015-03-26 NOTE — Progress Notes (Signed)
[facility] HEMATOLOGY FOLLOW UP NOTE DATE OF VISIT: 03/26/2015   Patient Care Team: Concepcion Elk, MD as PCP - General (Internal Medicine) Bernadene Bell, MD as Consulting Physician (Hematology) Aretta Nip, MD as Referring Physician (Family Medicine)  CHIEF COMPLAINTS Follow up DVT and polycythemia  HISTORY OF INITIAL PRESENTING ILLNESS (from Dr. Renella Cunas note):   Jesse Bowers 50 y.o. 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 Patient is a former Software engineer. Patient had a fractured foot repair in 2007 and caused a right leg DVT. At the time he took Coumadin for 3 months.   In May of this year 2015 patient presented with right-sided weakness and slurring of speech, he felt tired, had some nausea and was having trouble gripping the soap when he was in the shower. He checked himself in the mirror there was no facial droop but based on the symptoms he texted his wife took him to the hospital where he was kept overnight. His symptoms of TIA lasted for more than 24 hours and he had appropriate neuro imaging done. When he was seen by a neurologist later, he was told that he had a left frontal stroke. This happened at a young age of 13 so his primary care physician ordered hypercoagulable panel and that showed a protein C deficiency. This TIA or stroke event took place on 07/27/2013 an MRI brain from that date showed changes of early chronic small vessel ischemic changes. A note from the previous hematologist Dr Laury Axon in [facility] indicated that at [facility] they also felt that it was not a stroke. Carotid ultrasound showed minimal atherosclerotic plaques. The hypercoagulable panel was drawn on 08/19/2013 and he was negative for factor V Leiden mutation, prothrombin gene mutation. He had normal levels of antithrombin III and protein S but the protein C level was decreased at 47%(reference range is 70-180%).  A repeat protein C activity done on 09/28/2013 was 58% still low. Lupus anticoagulant was negative. Beta-2 glycoprotein antibodies were negative.  Patient is currently receiving a baby aspirin for last 3 months. The hematologist in [place] Dr. Alain Marion also gave him some Lovenox injections for prophylactic use just before a long distance travel. He encouraged him to avoid inactivity/immobility. Patient was not given an anticoagulant such as warfarin or the new generation drugs like Xarelto or Eliquis.  On 04/28/2013 patient's CBC showed a white count of 6.2 hemoglobin 18.7 hematocrit 53.1 MCV 92 platelet count of 207. Differential was normal. Chemistry panel was normal creatinine 1.23. Patient does have history of elevated liver enzymes in the past and the etiology of that is not clear likely medication related. He had liver biopsy done in 2013 which was reportedly normal. Patient was taking Remicade for over an year and it was helping his skin lesions of psoriasis but not the joint pain. It was switched to Enbrel which he is still taking a subcutaneous shot once a week that is every Sunday.  Patient also have some pain control issues and was seeing a pain specialist in [place]. He is in the process of getting a referral to a pain specialist here. His pain medications include morphine extended release, oxycodone, occasional naproxen and methadone. Patient is also on antidepressants.  Patient also had a clot at PICC line for antibiotics in 2006. Patient had history of psoriasis and psoriatic arthritis being managed by a rheumatologist in [place]. Now he is shifting his care to a local rheumatologist here.  Most of his psoriasis involves the bottom of the feet mostly on the left side, hands, knees and ears.  Patient also had a history of right-sided parotitis documented in May 2015 that was treated with antibiotics. He does have chronic dry mouth because of sleep apnea as well as his antidepressant  therapy.  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 That itself can increase the risk for both arterial as well as venous thromboembolic events. 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 For his depression patient was on Prozac in March 2015 but it was causing decreased sexual libido and changed to Wellbutrin in March 2015. He does have mild constipation with the use of narcotics and takes Colace twice a day.  Patient is also complaining of 50 pound weight loss in the last 3-4 months. He thinks it is unintentional. He has been on immunosuppressant medication such as Remicade and Enbrel and sometimes those can be associated with possibility of non-Hodgkin lymphomas. He also have chronic pain and recently saw an orthopedic physician who recommended MRI of the neck which will be scheduled. He is also seeing a rheumatologist next month, a neurologist and a pain specialist. He does complain of left shoulder pain and left elbow pain. There was some discussion by previous rheumatologist in [place] about switching his biological therapy boost to Stelara or [place] Nail once he is evaluated by a rheumatologist. Patient had already received his flu shot as well as pneumonia shot. He also complains of having significant short-term memory problems and trouble concentrating which could be from his medications or TIA episode.  So patient's active hematologic problems are  #1 Protein C deficiency documented twice. #2 TIA/CVA in May 2015 #3 history of DVT in 2007 Right leg treated with Coumadin x 3 months. It was provoked by foot injury #3 Use of immunosuppressant medications which increase the likelihood of lymphomas. 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  CURRENT THERAPY: Xarelto 20 mg once daily,  phlebotomy if hematocrit above 45%   INTERVAL HISTORY: Mr. Ausburn returns for follow-up. He is still on palpation rehabilitation for his left knee issue. He still has some pain, and he uses a walker. He wants to see a different orthopedic surgeon to get a second opinion. He otherwise doing well, does have mild fatigue, he is coming to see Dr. Loanne Drilling and he thinks he may need go up his testosterone dose. He is tolerating Xarelto well, no signs of bleeding.  MEDICAL HISTORY:   Past Medical History  Diagnosis Date  . psoriatic arthritis   . H/O protein C deficiency   . Hx-TIA (transient ischemic attack)   . Clotting disorder ([place])   . Celiac disease   . H/O parotitis     right   . Polycythemia, secondary   . Transgendered   . Sleep apnea   . Neck pain   . Abnormal weight loss   . Gluten enteropathy   . Syrinx of spinal cord ([place]) 01/06/2014 on MRI    c spine  . Anxiety     PTSD per patient    SURGICAL HISTORY:  Past Surgical History  Procedure Laterality Date  . Thyroidectomy, partial  2008  . Cholecystectomy    . Ankle arthroscopy with reconstruction Right 2007  . Knee surgery Bilateral 1984    Right ACL,  left PCL repair  . Liver biopsy  2013    normal results.  . Hip arthroscopy w/ labral repair Right 05/11/2013    SOCIAL HISTORY:  Social History   Social History  . Marital Status: Married    Spouse Name: N/A  . Number of Children: 2  . 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 Smoking status: Never Smoker   . Smokeless tobacco: Not on file  . Alcohol Use: Yes     Comment: wine once a month  . Drug Use: No  . Sexual Activity: Yes    Birth Control/ Protection: None     Comment: patient is a transgender on testosterone shots, no biological kids   Other Topics Concern  . 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 Relocated to [place] about 2 months ago from [place], [place]. Patient was in [place] for last 4 years and prior to that in [place]. His wife is working as Scientist, research (physical sciences) for [employer]. Patient is not working and applying for disability. They have 2 kids but no biologic children.     FAMILY HISTORY:  Family History  Problem Relation Age of Onset  . Stroke Paternal Uncle     age 53  . Stroke Maternal Grandfather     29  . Hypertension Mother   . Psoriasis Mother   . Stroke Maternal Grandmother   . Congestive Heart Failure Maternal Grandmother   . Cancer Paternal Grandfather   . Protein C deficiency Sister 1    Miscarriages    ALLERGIES:  is allergic to penicillin g; vancomycin; wheat extract; duloxetine; gabapentin; and sulfa antibiotics.  MEDICATIONS:  Current Outpatient Prescriptions  Medication Sig Dispense Refill  . clorazepate (TRANXENE) 7.5 MG tablet Take one tablet by mouth three times daily (Patient taking differently: Take 7.5 mg by mouth 4 (four) times daily. Take one tablet by mouth three times daily) 90 tablet 0  . cyclobenzaprine (FLEXERIL) 5 MG tablet Take 1 tablet (5 mg total) by mouth at bedtime. 30 tablet 0  . diclofenac sodium (VOLTAREN) 1 % GEL Apply 1 application topically 3 (three) times daily. To feet, hands, knees, elbows (Patient taking differently: Apply 1 application topically as needed. To feet, hands, knees, elbows) 3 Tube 4  . morphine (KADIAN) 60 MG 24 hr capsule Take 1 capsule (60 mg total) by mouth every 12 (twelve) hours. 60 capsule 0  . NONFORMULARY OR COMPOUNDED ITEM Apply 60 g topically 3 (three) times daily. 20% ketoprofen, 4%ketamine, 5% amitriptyline, 5% gabapentin  60 gm supply 1 each 4  . Oxycodone HCl 10 MG TABS Take 1 tablet (10 mg total) by mouth every 8 (eight) hours as needed. 90 tablet 0  . polyethylene glycol (MIRALAX / GLYCOLAX) packet Take 17  g by mouth as needed.     . prazosin (MINIPRESS) 2 MG capsule Take 6 mg by mouth at bedtime.    . rivaroxaban (XARELTO) 20 MG TABS tablet Take 1 tablet (20 mg total) by mouth daily with supper. 30 tablet 5  . Secukinumab 150 MG/ML SOAJ Inject into the skin. Inject every 28 days.    [name] testosterone cypionate (DEPO-TESTOSTERONE) 200 MG/ML injection Inject 50 mg into the muscle once a  week.    . venlafaxine (EFFEXOR) 75 MG tablet Take 150 mg by mouth every morning.      No current facility-administered medications for this visit.    REVIEW OF SYSTEMS:   Constitutional: Denies fevers, chills or abnormal night sweats Eyes: Denies blurriness of vision, double vision or watery eyes Ears, nose, mouth, throat, and face: Denies mucositis or sore throat, +Dry mouth Respiratory: Denies cough, dyspnea or wheezes Cardiovascular: Denies palpitation, chest discomfort or lower extremity swelling Gastrointestinal:  Denies nausea, heartburn, + change in bowel habits that is Constipation Skin: Hx of psoriasis and arthropathy from it.  Lymphatics: Denies new lymphadenopathy or easy bruising, hx of right parotitis twice. Neurological:Denies numbness, tingling or new weaknesses, recent TIA March 2015 Behavioral/Psych: Mood is stable, no new changes, +depression and anxiety, +memory problems All other systems were reviewed with the patient and are negative.  PHYSICAL EXAMINATION: ECOG PERFORMANCE STATUS: 2-3  Filed Vitals:   03/26/15 0949  BP: 132/89  Pulse: 120  Temp: 98.7 F (37.1 C)  Resp: 18   GENERAL:alert, no distress and comfortable SKIN: skin color, texture, turgor are normal, no rashes or significant lesions EYES: normal, conjunctiva are pink and non-injected, sclera clear except small bleeding in the inner of right sclera  OROPHARYNX:no exudate, no erythema and lips, buccal mucosa, and tongue normal  NECK: supple, thyroid normal size, non-tender, without nodularity LYMPH:  no palpable  lymphadenopathy in the cervical, axillary or inguinal LUNGS: clear to auscultation and percussion with normal breathing effort HEART: regular rate & rhythm and no murmurs and no lower extremity edema ABDOMEN:abdomen soft, non-tender and normal bowel sounds Musculoskeletal:no cyanosis of digits and no clubbing  PSYCH: alert & oriented x 3 NEURO: no focal motor/sensory deficits EXT: The surgical scar at the left knee has healed well, the range of motion of left knee has improved. No pitting edema.  LABORATORY DATA:  I have reviewed the data as listed CBC Latest Ref Rng 03/26/2015 01/29/2015 01/02/2015  WBC 4.0 - 10.3 10e3/uL 5.1 3.3(L) 5.1  Hemoglobin 13.0 - 17.1 g/dL 13.9 13.7 13.3  Hematocrit 38.4 - 49.9 % 42.7 42.4 40.4  Platelets 140 - 400 10e3/uL 191 176 193.0   CMP Latest Ref Rng 03/26/2015 01/29/2015 11/22/2014  Glucose 70 - 140 mg/dl 108 97 88  BUN 7.0 - 26.0 mg/dL 16.5 17.1 13.5  Creatinine 0.7 - 1.3 mg/dL 1.3 1.3 1.3  Sodium 136 - 145 mEq/L 140 138 139  Potassium 3.5 - 5.1 mEq/L 4.1 4.8 3.7  CO2 22 - 29 mEq/L 28 27 30(H)  Calcium 8.4 - 10.4 mg/dL 9.1 9.2 9.3  Total Protein 6.4 - 8.3 g/dL 6.8 6.9 6.8  Total Bilirubin 0.20 - 1.20 mg/dL 0.38 0.39 0.70  Alkaline Phos 40 - 150 U/L 229(H) 250(H) 282(H)  AST 5 - 34 U/L 21 39(H) 46(H)  ALT 0 - 55 U/L 29 28 54     Pathology report Diagnosis Bone Marrow, Aspirate,Biopsy, and Clot, right iliac - SLIGHTLY HYPERCELLULAR BONE MARROW FOR AGE WITH TRILINEAGE HEMATOPOIESIS. - SEE COMMENT. PERIPHERAL BLOOD: - NO SIGNIFICANT MORPHOLOGIC ABNORMALITIES. Diagnosis Note The bone marrow is slightly hypercellular with trilineage hematopoiesis and essentially orderly and progressive maturation of all cell types. Significant dyspoiesis is not present. Iron stores are markedly decreased to absent and hence correlation with serum iron studies is recommended. There is no morphologic evidence of a myeloproliferative process in this material.  Correlation with cytogenetic studies is recommended. (BNS:ecj 2014-04-22)   RADIOGRAPHIC STUDIES: No results found.  ASSESSMENT & PLAN:   1. 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 He has slightly hypercellular bone marrow with trilineage hematopoiesis.  No morphology evidence of myeloproliferative process. Cytogenetics was normal (no Y chromosome).  JAK2 mutation was negative. -He had Hemoglobin 18.7 hematocrit 53.1 in 04/2013. His able level is normal. His presentation is consistent with secondary polycythemia, likely secondary to testosterone injections. 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 Loanne Drilling to adjust his testosterone injection dose.  -We discussed that secondary polycythemia is usually benign, risk of thrombosis is relatively low than PV. However he did have history of stroke, and he feels fatigued when his hemoglobin is high. He agrees to continue phlebotomy as needed. -He developed anemia after the surgery secondary to blood loss, improved, hemoglobin 13.9 today, hematocrit 43%. No need for phlebotomy before surgery  2. History of DVT and stroke, Protein C deficiency  - continue Xarelto indefinitely given his underlying protein C deficiency and history of DVT and stroke. His previous DVT was provoked by foot injury. -Continue Xarelto indefinitely. Okay to hold for 1-2 days before joint injection by his rheumatologist.  3. History of CVA -His primary care physician is setting up a referral to a neurologist at [facility]. He will follow  4. 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 I prefer him to maintain low normal level of testosterone. He agrees. He will follow-up with Dr. Loanne Drilling   5. RA -He is  on Secukinumab.  Recommendations -continue Xarelto -CBC every 2 months and phlebotomy if hematocrit above 45%, no phlebotomy today -I'll see him in back in 4 months  All questions were answered. The patient knows to call the clinic with any problems, questions or concerns.  I spent 10 minutes counseling the patient face to face. The total time spent in the appointment was 15 minutes and more than 50% was on counseling.   [name]  03/26/2015

## 2015-03-26 NOTE — Telephone Encounter (Signed)
Left message to call back with details.

## 2015-03-27 ENCOUNTER — Ambulatory Visit (INDEPENDENT_AMBULATORY_CARE_PROVIDER_SITE_OTHER): Payer: BLUE CROSS/BLUE SHIELD | Admitting: Psychology

## 2015-03-27 DIAGNOSIS — F4310 Post-traumatic stress disorder, unspecified: Secondary | ICD-10-CM | POA: Diagnosis not present

## 2015-03-29 ENCOUNTER — Ambulatory Visit (INDEPENDENT_AMBULATORY_CARE_PROVIDER_SITE_OTHER): Payer: BLUE CROSS/BLUE SHIELD | Admitting: Psychology

## 2015-03-29 ENCOUNTER — Encounter: Payer: BLUE CROSS/BLUE SHIELD | Attending: Physical Medicine & Rehabilitation | Admitting: Registered Nurse

## 2015-03-29 ENCOUNTER — Encounter: Payer: Self-pay | Admitting: Registered Nurse

## 2015-03-29 VITALS — BP 118/77 | HR 89 | Resp 16

## 2015-03-29 DIAGNOSIS — Z96652 Presence of left artificial knee joint: Secondary | ICD-10-CM

## 2015-03-29 DIAGNOSIS — I69398 Other sequelae of cerebral infarction: Secondary | ICD-10-CM | POA: Diagnosis not present

## 2015-03-29 DIAGNOSIS — D751 Secondary polycythemia: Secondary | ICD-10-CM | POA: Diagnosis not present

## 2015-03-29 DIAGNOSIS — M7522 Bicipital tendinitis, left shoulder: Secondary | ICD-10-CM | POA: Diagnosis not present

## 2015-03-29 DIAGNOSIS — Z9181 History of falling: Secondary | ICD-10-CM | POA: Diagnosis not present

## 2015-03-29 DIAGNOSIS — I69351 Hemiplegia and hemiparesis following cerebral infarction affecting right dominant side: Secondary | ICD-10-CM | POA: Diagnosis not present

## 2015-03-29 DIAGNOSIS — Z86718 Personal history of other venous thrombosis and embolism: Secondary | ICD-10-CM | POA: Insufficient documentation

## 2015-03-29 DIAGNOSIS — G8101 Flaccid hemiplegia affecting right dominant side: Secondary | ICD-10-CM

## 2015-03-29 DIAGNOSIS — M545 Low back pain, unspecified: Secondary | ICD-10-CM

## 2015-03-29 DIAGNOSIS — Z87890 Personal history of sex reassignment: Secondary | ICD-10-CM | POA: Diagnosis not present

## 2015-03-29 DIAGNOSIS — M13862 Other specified arthritis, left knee: Secondary | ICD-10-CM | POA: Insufficient documentation

## 2015-03-29 DIAGNOSIS — Z7901 Long term (current) use of anticoagulants: Secondary | ICD-10-CM | POA: Diagnosis not present

## 2015-03-29 DIAGNOSIS — G8929 Other chronic pain: Secondary | ICD-10-CM | POA: Insufficient documentation

## 2015-03-29 DIAGNOSIS — D6859 Other primary thrombophilia: Secondary | ICD-10-CM | POA: Diagnosis not present

## 2015-03-29 DIAGNOSIS — F418 Other specified anxiety disorders: Secondary | ICD-10-CM | POA: Diagnosis not present

## 2015-03-29 DIAGNOSIS — K900 Celiac disease: Secondary | ICD-10-CM | POA: Insufficient documentation

## 2015-03-29 DIAGNOSIS — I69320 Aphasia following cerebral infarction: Secondary | ICD-10-CM | POA: Diagnosis not present

## 2015-03-29 DIAGNOSIS — L4050 Arthropathic psoriasis, unspecified: Secondary | ICD-10-CM

## 2015-03-29 DIAGNOSIS — R209 Unspecified disturbances of skin sensation: Secondary | ICD-10-CM | POA: Diagnosis not present

## 2015-03-29 DIAGNOSIS — Z79891 Long term (current) use of opiate analgesic: Secondary | ICD-10-CM | POA: Insufficient documentation

## 2015-03-29 DIAGNOSIS — F4310 Post-traumatic stress disorder, unspecified: Secondary | ICD-10-CM | POA: Diagnosis not present

## 2015-03-29 DIAGNOSIS — Z79899 Other long term (current) drug therapy: Secondary | ICD-10-CM

## 2015-03-29 DIAGNOSIS — S43002A Unspecified subluxation of left shoulder joint, initial encounter: Secondary | ICD-10-CM | POA: Insufficient documentation

## 2015-03-29 DIAGNOSIS — G894 Chronic pain syndrome: Secondary | ICD-10-CM

## 2015-03-29 DIAGNOSIS — Z5181 Encounter for therapeutic drug level monitoring: Secondary | ICD-10-CM

## 2015-03-29 MED ORDER — OXYCODONE HCL 10 MG PO TABS: 10.0000 mg | ORAL_TABLET | Freq: Three times a day (TID) | ORAL | Status: DC | PRN

## 2015-03-29 MED ORDER — MORPHINE SULFATE ER 60 MG PO CP24: 60.0000 mg | ORAL_CAPSULE | Freq: Two times a day (BID) | ORAL | Status: DC

## 2015-03-29 NOTE — Progress Notes (Signed)
Subjective:    Patient ID: Jesse Bowers, male    DOB: 04/23/1965, 50 y.o.   MRN: [id number]  HPI: [name] is a 50 year old male who returns for follow-up and medication refill. He says his pain is located in his mid-back, bilateral knees and bilateral feet. He rates his pain 6. His current exercise regime is performing Yoga twice a week and Physical therapy twice a week. Walking with walker. Also states he had reaction from compound cream (rash) no longer using.  Pain Inventory Average Pain 7 Pain Right Now 6 My pain is sharp, stabbing and aching  In the last 24 hours, has pain interfered with the following? General activity 8 Relation with others 9 Enjoyment of life 8 What TIME of day is your pain at its worst? morning, evening, night Sleep (in general) Poor  Pain is worse with: walking, sitting, standing and some activites Pain improves with: heat/ice, therapy/exercise, medication and injections Relief from Meds: 6  Mobility walk with assistance use a walker how many minutes can you walk? 5 ability to climb steps?  yes do you drive?  no use a wheelchair needs help with transfers  Function I need assistance with the following:  feeding, dressing, bathing, meal prep, household duties and shopping Do you have any goals in this area?  no  Neuro/Psych weakness trouble walking spasms confusion anxiety  Prior Studies Any changes since last visit?  no  Physicians involved in your care Any changes since last visit?  no   Family History  Problem Relation Age of Onset  . Stroke Paternal Uncle     age 3  . Stroke Maternal Grandfather     63  . Hypertension Mother   . Psoriasis Mother   . Stroke Maternal Grandmother   . Congestive Heart Failure Maternal Grandmother   . Cancer Paternal Grandfather   . Protein C deficiency Sister 72    Miscarriages   Social History   Social History  . Marital Status: Married    Spouse Name: N/A  . Number of  Children: 2  . 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 Smoking status: Never Smoker   . Smokeless tobacco: None  . Alcohol Use: Yes     Comment: wine once a month  . Drug Use: No  . Sexual Activity: Yes    Birth Control/ Protection: None     Comment: patient is a transgender on testosterone shots, no biological kids   Other Topics Concern  . 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 Relocated to [place] about 2 months ago from [place], [place]. Patient was in [place] for last 4 years and prior to that in [place]. His wife is working as Scientist, research (physical sciences) for [employer]. Patient is not working and applying for disability. They have 2 kids but no biologic children.    Past Surgical History  Procedure Laterality Date  . Thyroidectomy, partial  2008  . Cholecystectomy    . Ankle arthroscopy with reconstruction Right 2007  . Knee surgery Bilateral 1984    Right ACL, left PCL repair  . Liver biopsy  2013    normal results.  . Hip arthroscopy w/ labral repair Right 05/11/2013  Past Medical History  Diagnosis Date  . psoriatic arthritis   . H/O protein C deficiency   . Hx-TIA (transient ischemic attack)   . Clotting disorder ([place])   . Celiac disease   . H/O parotitis     right   . Polycythemia, secondary   . Transgendered   . Sleep apnea   . Neck pain   . Abnormal weight loss   . Gluten enteropathy   . Syrinx of spinal cord ([place]) 01/06/2014 on MRI    c spine  . Anxiety     PTSD per patient   BP 118/77 mmHg  Pulse 89  Resp 16  SpO2 97%  Opioid Risk Score:   Fall Risk Score:  `1  Depression screen PHQ 2/9  Depression screen PHQ 2/9 06/12/2014  Decreased Interest 2  Down, Depressed, Hopeless 0  PHQ - 2 Score 2  Altered sleeping 3  Tired,  decreased energy 3  Change in appetite 0  Feeling bad or failure about yourself  0  Trouble concentrating 3  Moving slowly or fidgety/restless 0  Suicidal thoughts 0  PHQ-9 Score 11      Review of Systems  All other systems reviewed and are negative.      Objective:   Physical Exam  Constitutional: He is oriented to person, place, and time. He appears well-developed and well-nourished.  HENT:  Head: Normocephalic and atraumatic.  Neck: Normal range of motion. Neck supple.  Cardiovascular: Normal rate and regular rhythm.   Pulmonary/Chest: Effort normal and breath sounds normal.  Musculoskeletal:  Normal Muscle Bulk and Muscle Testing Reveals: Upper Extremities: Full ROM and Muscle Strength 5/5 Lumbar Paraspinal Tenderness: L-3- L-5 Lower Extremities: Full ROM and Muscle Strength 5/5 Right AFO intact Arises from chair slowly using walker for support Narrow Based Gait  Neurological: He is alert and oriented to person, place, and time.  Skin: Skin is warm and dry.  Psychiatric: He has a normal mood and affect.  Nursing note and vitals reviewed.         Assessment & Plan:  1. Psoriatic arthritis with pain in multiple areas, most prominently feet, hands, elbows.: Refilled:Kadian 60 mg 24 hr. Capsule, one capsule every 12 hours #60 and Oxycodone 10 mg one tablet every 8 hours as needed #90. Rheumatology Following. 2. 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 Patello-femoral arthritis left knee: Continue Voltaren Gel S/P TKR on 07/03/14: Ortho Following 4. Chronic mid- low back pain: Continue current medication regime, and encourage to increase activity as tolerated. 5. Polycythemia: Oncology Following.  6. Depression with anxiety : Continue Effexor   20 minutes of face to face patient care time was spent during this visit. All questions were  encouraged and answered.

## 2015-04-03 ENCOUNTER — Telehealth: Payer: Self-pay

## 2015-04-03 ENCOUNTER — Ambulatory Visit (INDEPENDENT_AMBULATORY_CARE_PROVIDER_SITE_OTHER): Payer: BLUE CROSS/BLUE SHIELD | Admitting: Psychology

## 2015-04-03 ENCOUNTER — Telehealth: Payer: Self-pay | Admitting: *Deleted

## 2015-04-03 DIAGNOSIS — F4310 Post-traumatic stress disorder, unspecified: Secondary | ICD-10-CM

## 2015-04-03 NOTE — Telephone Encounter (Signed)
Pt's pain doctor has prescribed a topical cream that contains NSAID. Pt is questioning if this is compatible with him taking xarelto, or if his pain doctor will need to prescribe another medication. Pt said the doctor compounded it himself but it has 10% diclofenac in it. He is to use it TID.

## 2015-04-03 NOTE — Telephone Encounter (Signed)
Per Dr Burr Medico, Pt informed that if he have his MD take the diclofenac out of the cream for his knee that would be better but if not, to watch for bleeding.  She reports that even though topical, some of med can be absorbed.  Pt expressed understanding & will talk with MD.

## 2015-04-04 ENCOUNTER — Encounter: Payer: Self-pay | Admitting: Endocrinology

## 2015-04-04 ENCOUNTER — Ambulatory Visit (INDEPENDENT_AMBULATORY_CARE_PROVIDER_SITE_OTHER): Payer: BLUE CROSS/BLUE SHIELD | Admitting: Endocrinology

## 2015-04-04 VITALS — BP 132/68 | HR 106 | Temp 97.6°F | Ht 68.0 in | Wt 218.0 lb

## 2015-04-04 DIAGNOSIS — F640 Transsexualism: Secondary | ICD-10-CM

## 2015-04-04 DIAGNOSIS — Z789 Other specified health status: Secondary | ICD-10-CM

## 2015-04-04 NOTE — Patient Instructions (Addendum)
blood tests are requested for you today.  We'll let you know about the results.   Please come back for a follow-up appointment in 3-6 months.

## 2015-04-04 NOTE — Progress Notes (Signed)
Subjective:    Patient ID: Jesse Bowers, male    DOB: September 09, 1965, 50 y.o.   MRN: [id number]  HPI Pt is here for f/u of F to M transgender state.  He had breast reduction in approx 2001, and TSH/BSO in 1997. He sees a therapist on a regular basis.  He had a CVA in 2015, which was felt to be possibly related to the polycythemia (in addition to protein C deficiency; in 2016, testosterone was reduced, due to polycythemia).  Since the testosterone was reduced, acne is improved.  He last took testosterone 7 days ago.  He says the testosterone injections are never late.  He has not had phlebotomy since early 2016.   He continues to c/o ED sxs.   Acne persists.   Past Medical History  Diagnosis Date  . psoriatic arthritis   . H/O protein C deficiency   . Hx-TIA (transient ischemic attack)   . Clotting disorder ([place])   . Celiac disease   . H/O parotitis     right   . Polycythemia, secondary   . Transgendered   . Sleep apnea   . Neck pain   . Abnormal weight loss   . Gluten enteropathy   . Syrinx of spinal cord ([place]) 01/06/2014 on MRI    c spine  . Anxiety     PTSD per patient    Past Surgical History  Procedure Laterality Date  . Thyroidectomy, partial  2008  . Cholecystectomy    . Ankle arthroscopy with reconstruction Right 2007  . Knee surgery Bilateral 1984    Right ACL, left PCL repair  . Liver biopsy  2013    normal results.  . Hip arthroscopy w/ labral repair Right 05/11/2013    Social History   Social History  . Marital Status: Married    Spouse Name: N/A  . Number of Children: 2  . 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 Smoking status: Never Smoker   . Smokeless tobacco: Not on file  . Alcohol Use: Yes     Comment: wine once a month  . Drug Use: No  . Sexual Activity: Yes    Birth Control/ Protection: None     Comment: patient is a transgender on  testosterone shots, no biological kids   Other Topics Concern  . Not on file   Social History Narrative   Education 4 year college, former Therapist, sports X 15 years, pediatric nurse practitioner x 6 years, did NP degree from [employer] of [place]. Relocated to [place] about 2 months ago from [place], [place]. Patient was in [place] for last 4 years and prior to that in [place]. His wife is working as Scientist, research (physical sciences) for [employer]. Patient is not working and applying for disability. They have 2 kids but no biologic children.     Current Outpatient Prescriptions on File Prior to Visit  Medication Sig Dispense Refill  . clorazepate (TRANXENE) 7.5 MG tablet Take one tablet by mouth three times daily (Patient taking differently: Take 7.5 mg by mouth 4 (four) times daily. Take one tablet by mouth three times daily) 90 tablet 0  . cyclobenzaprine (FLEXERIL) 5 MG tablet Take 1 tablet (5 mg total) by mouth at bedtime. 30 tablet 0  . diclofenac sodium (VOLTAREN) 1 % GEL Apply 1 application topically 3 (three) times daily. To feet, hands, knees,  elbows (Patient taking differently: Apply 1 application topically as needed. To feet, hands, knees, elbows) 3 Tube 4  . morphine (KADIAN) 60 MG 24 hr capsule Take 1 capsule (60 mg total) by mouth every 12 (twelve) hours. 60 capsule 0  . NONFORMULARY OR COMPOUNDED ITEM Apply 60 g topically 3 (three) times daily. 20% ketoprofen, 4%ketamine, 5% amitriptyline, 5% gabapentin  60 gm supply 1 each 4  . Oxycodone HCl 10 MG TABS Take 1 tablet (10 mg total) by mouth every 8 (eight) hours as needed. 90 tablet 0  . polyethylene glycol (MIRALAX / GLYCOLAX) packet Take 17 g by mouth as needed.     . prazosin (MINIPRESS) 2 MG capsule Take 6 mg by mouth at bedtime.    . rivaroxaban (XARELTO) 20 MG TABS tablet Take 1 tablet (20 mg total) by mouth daily with supper. 30 tablet 5  . Secukinumab 150 MG/ML SOAJ Inject into the skin. Inject every 28 days.    [name] testosterone cypionate  (DEPO-TESTOSTERONE) 200 MG/ML injection Inject 50 mg into the muscle once a week.    . venlafaxine (EFFEXOR) 75 MG tablet Take 150 mg by mouth every morning.      No current facility-administered medications on file prior to visit.    Allergies  Allergen Reactions  . Penicillin G Anaphylaxis  . Vancomycin Rash  . Wheat Extract Other (See Comments)    CELIAC DISEASE  . Duloxetine     Restless legs  . Gabapentin Nausea Only  . Sulfa Antibiotics Rash    Stevens-Johnson rash    Family History  Problem Relation Age of Onset  . Stroke Paternal Uncle     age 10  . Stroke Maternal Grandfather     34  . Hypertension Mother   . Psoriasis Mother   . Stroke Maternal Grandmother   . Congestive Heart Failure Maternal Grandmother   . Cancer Paternal Grandfather   . Protein C deficiency Sister 79    Miscarriages    BP 132/68 mmHg  Pulse 106  Temp(Src) 97.6 F (36.4 C) (Oral)  Ht 5' 8"  (1.727 m)  Wt 218 lb (98.884 kg)  BMI 33.15 kg/m2  SpO2 98%  Review of Systems He has fatigue and decreased libido.    Objective:   Physical Exam VITAL SIGNS:  See vs page GENERAL: no distress Skin: minimal acne on the face and back Addendum: please come to recheck the testosterone.  Lab Results  Component Value Date   WBC 5.1 03/26/2015   HGB 13.9 03/26/2015   HCT 42.7 03/26/2015   MCV 83.4 03/26/2015   PLT 191 03/26/2015   Lab Results  Component Value Date   TESTOSTERONE 61* 04/04/2015      Assessment & Plan:  transgender state, with fluctuating testosterone level.    Polycythemia: this setting, we can't safely increase the testosterone.    Patient is advised the following: Patient Instructions  blood tests are requested for you today.  We'll let you know about the results.   Please come back for a follow-up appointment in 3-6 months.    addendum: come in to recheck

## 2015-04-05 ENCOUNTER — Telehealth: Payer: Self-pay | Admitting: Endocrinology

## 2015-04-05 ENCOUNTER — Ambulatory Visit: Payer: BLUE CROSS/BLUE SHIELD | Admitting: Psychology

## 2015-04-05 ENCOUNTER — Other Ambulatory Visit: Payer: Self-pay | Admitting: Registered Nurse

## 2015-04-05 LAB — TESTOSTERONE,FREE AND TOTAL
TESTOSTERONE: 61 ng/dL — AB (ref 348–1197)
Testosterone, Free: 1.7 pg/mL — ABNORMAL LOW (ref 6.8–21.5)

## 2015-04-05 NOTE — Telephone Encounter (Signed)
Patient called stating that he was called regarding that his testosterone is low He claims Dr. Loanne Drilling lowered his medication; now his testosterone is very low Can he take his previous dosage?   Also, he would like a refill on his Rx  Rx: Testosterone   Please advise

## 2015-04-05 NOTE — Telephone Encounter (Signed)
See note below and please advise, Thanks! 

## 2015-04-06 DIAGNOSIS — Z7689 Persons encountering health services in other specified circumstances: Secondary | ICD-10-CM

## 2015-04-06 MED ORDER — TESTOSTERONE CYPIONATE 200 MG/ML IM SOLN: 50.0000 mg | INTRAMUSCULAR | Status: DC

## 2015-04-06 NOTE — Telephone Encounter (Signed)
i printed rx It is not safe to increase, due to the high red blood cells. Please come in for recheck

## 2015-04-06 NOTE — Telephone Encounter (Signed)
Yes, it has been better recently.  She suggested keeping the testosterone in the low-normal range.

## 2015-04-06 NOTE — Telephone Encounter (Signed)
I contacted the pt and advised of note below via voicemail. Requested a call back if th pt would like to discuss.

## 2015-04-06 NOTE — Telephone Encounter (Signed)
Pt notified of note below. Pt is confused about his RBC count. Pt was advised by his Hematologist , Dr. Burr Medico  that is levels were normal. Pt wanted to know if you would be willing to discuss his level with her?  Phone number is [phone].

## 2015-04-07 DIAGNOSIS — R748 Abnormal levels of other serum enzymes: Secondary | ICD-10-CM | POA: Insufficient documentation

## 2015-04-07 DIAGNOSIS — R269 Unspecified abnormalities of gait and mobility: Secondary | ICD-10-CM | POA: Insufficient documentation

## 2015-04-10 ENCOUNTER — Ambulatory Visit (INDEPENDENT_AMBULATORY_CARE_PROVIDER_SITE_OTHER): Payer: BLUE CROSS/BLUE SHIELD | Admitting: Psychology

## 2015-04-10 DIAGNOSIS — F4310 Post-traumatic stress disorder, unspecified: Secondary | ICD-10-CM | POA: Diagnosis not present

## 2015-04-12 ENCOUNTER — Ambulatory Visit (INDEPENDENT_AMBULATORY_CARE_PROVIDER_SITE_OTHER): Payer: BLUE CROSS/BLUE SHIELD | Admitting: Psychology

## 2015-04-12 DIAGNOSIS — F4310 Post-traumatic stress disorder, unspecified: Secondary | ICD-10-CM

## 2015-04-13 ENCOUNTER — Telehealth: Payer: Self-pay | Admitting: Endocrinology

## 2015-04-13 NOTE — Telephone Encounter (Signed)
[facility] called following up on the Testosterone Inj PA that was faxed twice.

## 2015-04-13 NOTE — Telephone Encounter (Signed)
PA has been faxed we are waiting on the response.

## 2015-04-16 ENCOUNTER — Encounter: Payer: Self-pay | Admitting: Physical Medicine & Rehabilitation

## 2015-04-16 ENCOUNTER — Telehealth: Payer: Self-pay | Admitting: Physical Medicine & Rehabilitation

## 2015-04-16 MED ORDER — NONFORMULARY OR COMPOUNDED ITEM: 1.0000 "application " | Freq: Three times a day (TID) | Status: DC

## 2015-04-16 NOTE — Telephone Encounter (Signed)
Called to [facility] per [place]'s request.

## 2015-04-16 NOTE — Telephone Encounter (Signed)
Left message for Jesse Bowers to call and let us know where he was getting his compound cream filled.

## 2015-04-16 NOTE — Telephone Encounter (Signed)
i've written a new order for compounded cream.

## 2015-04-16 NOTE — Progress Notes (Unsigned)
Per patient e-mail:  This seems the fastest way to get a hold of Dr. Albertina Senegal. Three things, I'm using my wheelchair, ordered by my rheumatologist for pain in the feet and continued knee issues with regard to left knee replacement that's going to need to be redone totally again by someone and no who I used previously. My PCP and PT's both think I'm having an allergic reaction to the cement or glue or whatever they used to seal it as it is very warm along the joint lines and medial side still remains a hot spot as well. In his last OR note, noted that there was some toggle within the tibular but he didn't think enough to redo the entire replacement at that time, now it wiggles side to side and front to back when walking, swinging legs, using a walker, up and down the stairs. So I'm trying to get a second opinion from another ortho, who my BCBS case manager states usually don't want to mess with another doc's work. But I'm going to need a new one, so he won't be messing with someone else's work per se but removing it and replacing with a well fitted LKR would be ideal.  on the medication he made for me with the ketamine. I talked to my pharmacist as well as my PCP and though my gabapentin caused dystonia prior she said, its basically unable to be trusted because it may respond differently in a different form. Additionally, my Hematologist at [facility] just across the street from your office (Dr. Burr Medico, sounds like wong, fong, not [place], if you call her) states that the NSAIDS in there are not advised due to my xeralto and would limit the amount of NSAIDS I'm using because I was using it in great big areas, feet, knees, then shoulders and elbows and toes and hands. And she feels that is too much and will interact with my xeralto. So I honestly don't know what to do but OMG that stuff was the bomb and helped so much. I remember him giving me a topical ibuprofen and I got red and hot after using it so that may be  the cause as well but its really hard to care when you finally break the cycle of uncontrolled pain with some topical creams. I haven't used it since I was told to stop using it by my pharmacist and when I used it, my level of pain has been around a 8-9 consistently but was reduced to around a 4. I can live with a 4. I also have seen some improvement with my giant plaques of psoriasis on my feet, above my bum, hands, knees, legs but nothing on the ears yet or the back of my neck. Everywhere else is doing well and so that means that pain may be working too with consentyx. So it's really hard to understand or determine what to do topically. I do still use the Vectical when my feet and hands are killing me just to take the edge off a bit so I can sleep. But I'm wondering if there's anything else he can use or figure out. The pharmacist at the compounding place, I'm sorry I can't remember which one, I don't even remember what I had for breakfast this morning. But he said his compound was not in a sulfa mixture as many people are quite allergic to it and he as well blamed the gabapentin and the NSAID for the flushing side effect. But he's  willing tottery something else if Dr. Albertina Senegal can come up with something.   So that's it, it would have taken me 6 emails on that [place] my chart to get that to him. So thanks for being my go between. The wheelchair helping a lot with the feet and knee pain. My elbow received some major PT and three injections and now has finally calmed down and my wheelchair was fitted to give my elbows & shoulders less stress when going and it is seeming to help. My shoulders are starting to hurt again and I do see Zella Ball at my next visit this week or the next. But I'm wondering if I can just get in to get two shoulder injections at some point. We don't have to discuss meds, Zella Ball can do that for him, I just need some help with my shoulders. I think its been about 3 to 4 months since my last ones  and they are also getting a bit of PT as well with the elbow, can't help but work the shoulders as well and that is helping with the pain as well but they do get quite upset after a whole day of being in the chair around the house or going to apps. So if you could look into that injection thing, I would really appreciate it. I felt like you were getting a little tired of me because you kept adding meds and nothing was helping. That cream was the golden ticket and did just what it needed to do and break they cycle of pain I was in and nothing was working. Now I don't have the cream but the current regimine I'm on is doing fairly well and I'm feeling better though I'm still not doing my own ADL's like food prep, finances, laundry, making food, I still need help getting in and out of the shower and have some anxiety issues surrounding that as that was the place I had my stroke, which was backed up by Dr. [name] of the stroke being true. But I do need something topical if he can manage it because it does seem to help with the breaking of the cycle of the oral meds which are definitely helping now vs last time I saw him.   Anyway, I hope you had a great weekend and I've been working on my counting since the last time we were all together getting rid of that one med .  Jesse Bowers [phone] (Cell and it is private)

## 2015-04-17 ENCOUNTER — Telehealth: Payer: Self-pay | Admitting: Endocrinology

## 2015-04-17 ENCOUNTER — Ambulatory Visit (INDEPENDENT_AMBULATORY_CARE_PROVIDER_SITE_OTHER): Payer: BLUE CROSS/BLUE SHIELD | Admitting: Psychology

## 2015-04-17 DIAGNOSIS — F4310 Post-traumatic stress disorder, unspecified: Secondary | ICD-10-CM

## 2015-04-17 NOTE — Telephone Encounter (Signed)
Patient stated that Pharmacy will not fill his prescription testosterone cypionate (DEPO-TESTOSTERONE) 200 MG/ML, stating it will have to filled by a Endo doctor, which he is. please call the pharmacy, let them know he a Endo doctor. Please advise  [facility] [id number] [place] [address], [place] [phone] (Phone) [phone] (Fax)

## 2015-04-17 NOTE — Telephone Encounter (Signed)
I contacted the pt's pharmacy and gave a verbal on the pt's medication. Pt notified.

## 2015-04-19 ENCOUNTER — Ambulatory Visit (INDEPENDENT_AMBULATORY_CARE_PROVIDER_SITE_OTHER): Payer: BLUE CROSS/BLUE SHIELD | Admitting: Psychology

## 2015-04-19 DIAGNOSIS — F4310 Post-traumatic stress disorder, unspecified: Secondary | ICD-10-CM

## 2015-04-19 DIAGNOSIS — Z7689 Persons encountering health services in other specified circumstances: Secondary | ICD-10-CM

## 2015-04-20 ENCOUNTER — Telehealth: Payer: Self-pay

## 2015-04-20 NOTE — Telephone Encounter (Signed)
Received approval for the pt's testosterone injections.

## 2015-04-23 ENCOUNTER — Encounter: Payer: Self-pay | Admitting: Hematology

## 2015-04-24 ENCOUNTER — Ambulatory Visit (INDEPENDENT_AMBULATORY_CARE_PROVIDER_SITE_OTHER): Payer: BLUE CROSS/BLUE SHIELD | Admitting: Psychology

## 2015-04-24 ENCOUNTER — Encounter: Payer: Self-pay | Admitting: Hematology

## 2015-04-24 DIAGNOSIS — F4310 Post-traumatic stress disorder, unspecified: Secondary | ICD-10-CM | POA: Diagnosis not present

## 2015-04-25 ENCOUNTER — Ambulatory Visit: Payer: Self-pay | Admitting: Endocrinology

## 2015-04-26 ENCOUNTER — Ambulatory Visit (INDEPENDENT_AMBULATORY_CARE_PROVIDER_SITE_OTHER): Payer: BLUE CROSS/BLUE SHIELD | Admitting: Psychology

## 2015-04-26 ENCOUNTER — Encounter: Payer: BLUE CROSS/BLUE SHIELD | Attending: Physical Medicine & Rehabilitation | Admitting: Registered Nurse

## 2015-04-26 DIAGNOSIS — Z87890 Personal history of sex reassignment: Secondary | ICD-10-CM | POA: Insufficient documentation

## 2015-04-26 DIAGNOSIS — F4310 Post-traumatic stress disorder, unspecified: Secondary | ICD-10-CM | POA: Diagnosis not present

## 2015-04-26 DIAGNOSIS — K900 Celiac disease: Secondary | ICD-10-CM | POA: Insufficient documentation

## 2015-04-26 DIAGNOSIS — L4050 Arthropathic psoriasis, unspecified: Secondary | ICD-10-CM | POA: Insufficient documentation

## 2015-04-26 DIAGNOSIS — I69320 Aphasia following cerebral infarction: Secondary | ICD-10-CM | POA: Insufficient documentation

## 2015-04-26 DIAGNOSIS — D751 Secondary polycythemia: Secondary | ICD-10-CM | POA: Insufficient documentation

## 2015-04-26 DIAGNOSIS — Z7901 Long term (current) use of anticoagulants: Secondary | ICD-10-CM | POA: Insufficient documentation

## 2015-04-26 DIAGNOSIS — D6859 Other primary thrombophilia: Secondary | ICD-10-CM | POA: Insufficient documentation

## 2015-04-26 DIAGNOSIS — F418 Other specified anxiety disorders: Secondary | ICD-10-CM | POA: Insufficient documentation

## 2015-04-26 DIAGNOSIS — G8929 Other chronic pain: Secondary | ICD-10-CM | POA: Insufficient documentation

## 2015-04-26 DIAGNOSIS — I69351 Hemiplegia and hemiparesis following cerebral infarction affecting right dominant side: Secondary | ICD-10-CM | POA: Insufficient documentation

## 2015-04-26 DIAGNOSIS — I69398 Other sequelae of cerebral infarction: Secondary | ICD-10-CM | POA: Insufficient documentation

## 2015-04-26 DIAGNOSIS — M7522 Bicipital tendinitis, left shoulder: Secondary | ICD-10-CM | POA: Insufficient documentation

## 2015-04-26 DIAGNOSIS — M545 Low back pain: Secondary | ICD-10-CM | POA: Insufficient documentation

## 2015-04-26 DIAGNOSIS — Z79899 Other long term (current) drug therapy: Secondary | ICD-10-CM | POA: Insufficient documentation

## 2015-04-26 DIAGNOSIS — Z86718 Personal history of other venous thrombosis and embolism: Secondary | ICD-10-CM | POA: Insufficient documentation

## 2015-04-26 DIAGNOSIS — M13862 Other specified arthritis, left knee: Secondary | ICD-10-CM | POA: Insufficient documentation

## 2015-04-26 DIAGNOSIS — S43002A Unspecified subluxation of left shoulder joint, initial encounter: Secondary | ICD-10-CM | POA: Insufficient documentation

## 2015-04-26 DIAGNOSIS — Z79891 Long term (current) use of opiate analgesic: Secondary | ICD-10-CM | POA: Insufficient documentation

## 2015-04-26 DIAGNOSIS — Z9181 History of falling: Secondary | ICD-10-CM | POA: Insufficient documentation

## 2015-04-26 DIAGNOSIS — R209 Unspecified disturbances of skin sensation: Secondary | ICD-10-CM | POA: Insufficient documentation

## 2015-04-30 ENCOUNTER — Ambulatory Visit (INDEPENDENT_AMBULATORY_CARE_PROVIDER_SITE_OTHER): Payer: BLUE CROSS/BLUE SHIELD | Admitting: Endocrinology

## 2015-04-30 ENCOUNTER — Encounter: Payer: Self-pay | Admitting: Registered Nurse

## 2015-04-30 ENCOUNTER — Encounter (HOSPITAL_BASED_OUTPATIENT_CLINIC_OR_DEPARTMENT_OTHER): Payer: BLUE CROSS/BLUE SHIELD | Admitting: Registered Nurse

## 2015-04-30 ENCOUNTER — Encounter: Payer: Self-pay | Admitting: Endocrinology

## 2015-04-30 ENCOUNTER — Telehealth: Payer: Self-pay

## 2015-04-30 VITALS — BP 118/88 | HR 115 | Temp 98.1°F | Ht 68.0 in | Wt 225.0 lb

## 2015-04-30 VITALS — BP 118/83 | HR 120 | Resp 14

## 2015-04-30 DIAGNOSIS — M545 Low back pain, unspecified: Secondary | ICD-10-CM

## 2015-04-30 DIAGNOSIS — Z96652 Presence of left artificial knee joint: Secondary | ICD-10-CM

## 2015-04-30 DIAGNOSIS — F640 Transsexualism: Secondary | ICD-10-CM | POA: Diagnosis not present

## 2015-04-30 DIAGNOSIS — G8101 Flaccid hemiplegia affecting right dominant side: Secondary | ICD-10-CM

## 2015-04-30 DIAGNOSIS — Z5181 Encounter for therapeutic drug level monitoring: Secondary | ICD-10-CM

## 2015-04-30 DIAGNOSIS — M7522 Bicipital tendinitis, left shoulder: Secondary | ICD-10-CM | POA: Diagnosis not present

## 2015-04-30 DIAGNOSIS — G894 Chronic pain syndrome: Secondary | ICD-10-CM | POA: Diagnosis not present

## 2015-04-30 DIAGNOSIS — Z7901 Long term (current) use of anticoagulants: Secondary | ICD-10-CM | POA: Diagnosis not present

## 2015-04-30 DIAGNOSIS — Z789 Other specified health status: Secondary | ICD-10-CM

## 2015-04-30 DIAGNOSIS — S43002A Unspecified subluxation of left shoulder joint, initial encounter: Secondary | ICD-10-CM | POA: Diagnosis not present

## 2015-04-30 DIAGNOSIS — G8929 Other chronic pain: Secondary | ICD-10-CM | POA: Diagnosis present

## 2015-04-30 DIAGNOSIS — I69398 Other sequelae of cerebral infarction: Secondary | ICD-10-CM | POA: Diagnosis not present

## 2015-04-30 DIAGNOSIS — M13862 Other specified arthritis, left knee: Secondary | ICD-10-CM | POA: Diagnosis not present

## 2015-04-30 DIAGNOSIS — R209 Unspecified disturbances of skin sensation: Secondary | ICD-10-CM | POA: Diagnosis not present

## 2015-04-30 DIAGNOSIS — Z9181 History of falling: Secondary | ICD-10-CM | POA: Diagnosis not present

## 2015-04-30 DIAGNOSIS — Z79899 Other long term (current) drug therapy: Secondary | ICD-10-CM | POA: Diagnosis not present

## 2015-04-30 DIAGNOSIS — D751 Secondary polycythemia: Secondary | ICD-10-CM | POA: Diagnosis not present

## 2015-04-30 DIAGNOSIS — Z87890 Personal history of sex reassignment: Secondary | ICD-10-CM | POA: Diagnosis not present

## 2015-04-30 DIAGNOSIS — D6859 Other primary thrombophilia: Secondary | ICD-10-CM | POA: Diagnosis not present

## 2015-04-30 DIAGNOSIS — Z86718 Personal history of other venous thrombosis and embolism: Secondary | ICD-10-CM | POA: Diagnosis not present

## 2015-04-30 DIAGNOSIS — L4050 Arthropathic psoriasis, unspecified: Secondary | ICD-10-CM | POA: Diagnosis not present

## 2015-04-30 DIAGNOSIS — F418 Other specified anxiety disorders: Secondary | ICD-10-CM | POA: Diagnosis not present

## 2015-04-30 DIAGNOSIS — I69320 Aphasia following cerebral infarction: Secondary | ICD-10-CM | POA: Diagnosis not present

## 2015-04-30 DIAGNOSIS — K900 Celiac disease: Secondary | ICD-10-CM | POA: Diagnosis not present

## 2015-04-30 DIAGNOSIS — I69351 Hemiplegia and hemiparesis following cerebral infarction affecting right dominant side: Secondary | ICD-10-CM | POA: Diagnosis not present

## 2015-04-30 DIAGNOSIS — Z79891 Long term (current) use of opiate analgesic: Secondary | ICD-10-CM | POA: Diagnosis not present

## 2015-04-30 LAB — CBC WITH DIFFERENTIAL/PLATELET
BASOS ABS: 0 10*3/uL (ref 0.0–0.1)
Basophils Relative: 0.3 % (ref 0.0–3.0)
Eosinophils Absolute: 0 10*3/uL (ref 0.0–0.7)
Eosinophils Relative: 0 % (ref 0.0–5.0)
HCT: 44.7 % (ref 39.0–52.0)
Hemoglobin: 15.1 g/dL (ref 13.0–17.0)
LYMPHS ABS: 1.1 10*3/uL (ref 0.7–4.0)
Lymphocytes Relative: 29.1 % (ref 12.0–46.0)
MCHC: 33.7 g/dL (ref 30.0–36.0)
MCV: 81.4 fl (ref 78.0–100.0)
MONOS PCT: 9 % (ref 3.0–12.0)
Monocytes Absolute: 0.3 10*3/uL (ref 0.1–1.0)
NEUTROS PCT: 61.6 % (ref 43.0–77.0)
Neutro Abs: 2.4 10*3/uL (ref 1.4–7.7)
Platelets: 180 10*3/uL (ref 150.0–400.0)
RBC: 5.49 Mil/uL (ref 4.22–5.81)
RDW: 15.1 % (ref 11.5–15.5)
WBC: 3.9 10*3/uL — ABNORMAL LOW (ref 4.0–10.5)

## 2015-04-30 MED ORDER — MORPHINE SULFATE ER 60 MG PO CP24: 60.0000 mg | ORAL_CAPSULE | Freq: Two times a day (BID) | ORAL | Status: DC

## 2015-04-30 NOTE — Patient Instructions (Signed)
blood tests are requested for you today.  We'll let you know about the results.  Please come back for a follow-up appointment in 6 months

## 2015-04-30 NOTE — Telephone Encounter (Signed)
Pt called wanting to report 2 weeks ago he took  0.15 mls of testosterone during the time he was waiting for his tesosterone to be approved and 0.25 mls last week. Pt stated he for got to mention this during his appoinment and wanted you to be notified due to his blood test he had done.

## 2015-04-30 NOTE — Progress Notes (Signed)
Subjective:    Patient ID: Jesse Bowers, male    DOB: 03-13-1965, 50 y.o.   MRN: [id number]  HPI Pt is here for f/u of F to M transgender state.  He had breast reduction in approx 2001, and TSH/BSO in 1997. He sees a therapist on a regular basis.  He had a CVA in 2015, which was felt to be possibly related to the polycythemia (in addition to protein C deficiency; in 2016, testosterone was reduced, due to polycythemia).  He has had 2 DVT's (LLE in 1992, and RUE in 2006).  He has not had phlebotomy since early 2016.  Pt says he resumed the testosterone injections 1 week ago today, after missing 1 dose.  He is uncertain why the blood test was so low earlier this month.  He continues to c/o ED sxs.  Past Medical History  Diagnosis Date  . psoriatic arthritis   . H/O protein C deficiency   . Hx-TIA (transient ischemic attack)   . Clotting disorder ([place])   . Celiac disease   . H/O parotitis     right   . Polycythemia, secondary   . Transgendered   . Sleep apnea   . Neck pain   . Abnormal weight loss   . Gluten enteropathy   . Syrinx of spinal cord ([place]) 01/06/2014 on MRI    c spine  . Anxiety     PTSD per patient    Past Surgical History  Procedure Laterality Date  . Thyroidectomy, partial  2008  . Cholecystectomy    . Ankle arthroscopy with reconstruction Right 2007  . Knee surgery Bilateral 1984    Right ACL, left PCL repair  . Liver biopsy  2013    normal results.  . Hip arthroscopy w/ labral repair Right 05/11/2013    Social History   Social History  . Marital Status: Married    Spouse Name: N/A  . Number of Children: 2  . 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 Smoking status: Never Smoker   . Smokeless tobacco: Not on file  . Alcohol Use: Yes     Comment: wine once a month  . Drug Use: No  . Sexual Activity: Yes    Birth Control/ Protection: None   Comment: patient is a transgender on testosterone shots, no biological kids   Other Topics Concern  . 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 Relocated to [place] about 2 months ago from [place], [place]. Patient was in [place] for last 4 years and prior to that in [place]. His wife is working as Scientist, research (physical sciences) for [employer]. Patient is not working and applying for disability. They have 2 kids but no biologic children.     Current Outpatient Prescriptions on File Prior to Visit  Medication Sig Dispense Refill  . clorazepate (TRANXENE) 7.5 MG tablet Take one tablet by mouth three times daily (Patient taking differently: Take 7.5 mg by mouth 4 (four) times daily. Take one tablet by mouth three times daily) 90 tablet 0  . cyclobenzaprine (FLEXERIL) 5 MG tablet Take 1 tablet (5 mg total) by mouth at bedtime. 30 tablet 0  . diclofenac sodium (VOLTAREN) 1 % GEL Apply 1 application topically  3 (three) times daily. To feet, hands, knees, elbows (Patient taking differently: Apply 1 application topically as needed. To feet, hands, knees, elbows) 3 Tube 4  . NONFORMULARY OR COMPOUNDED ITEM Apply 1 application topically 3 (three) times daily. 8% ketamine, 5% amitriptyline, 5% baclofen, 5% gabapentin  60 GM 1 each 5  . Oxycodone HCl 10 MG TABS Take 1 tablet (10 mg total) by mouth every 8 (eight) hours as needed. 90 tablet 0  . polyethylene glycol (MIRALAX / GLYCOLAX) packet Take 17 g by mouth as needed.     . prazosin (MINIPRESS) 2 MG capsule Take 6 mg by mouth at bedtime.    . rivaroxaban (XARELTO) 20 MG TABS tablet Take 1 tablet (20 mg total) by mouth daily with supper. 30 tablet 5  . Secukinumab 150 MG/ML SOAJ Inject into the skin. Inject every 28 days.    [name] testosterone cypionate (DEPO-TESTOSTERONE) 200 MG/ML injection Inject 0.25 mLs (50 mg total) into the muscle once a  week. 10 mL 0  . venlafaxine (EFFEXOR) 75 MG tablet Take 150 mg by mouth every morning.      No current facility-administered medications on file prior to visit.    Allergies  Allergen Reactions  . Penicillin G Anaphylaxis  . Vancomycin Rash  . Wheat Extract Other (See Comments)    CELIAC DISEASE  . Duloxetine     Restless legs  . Gabapentin Nausea Only  . Sulfa Antibiotics Rash    Stevens-Johnson rash    Family History  Problem Relation Age of Onset  . Stroke Paternal Uncle     age 56  . Stroke Maternal Grandfather     44  . Hypertension Mother   . Psoriasis Mother   . Stroke Maternal Grandmother   . Congestive Heart Failure Maternal Grandmother   . Cancer Paternal Grandfather   . Protein C deficiency Sister 56    Miscarriages    BP 118/88 mmHg  Pulse 115  Temp(Src) 98.1 F (36.7 C) (Oral)  Ht 5' 8"  (1.727 m)  Wt 225 lb (102.059 kg)  BMI 34.22 kg/m2  SpO2 97%  Review of Systems He has slight urinary hesitation.      Objective:   Physical Exam VITAL SIGNS:  See vs page GENERAL: no distress. Ext: trace bilat leg edema.     Lab Results  Component Value Date   TESTOSTERONE 212* 04/30/2015   Lab Results  Component Value Date   WBC 3.9* 04/30/2015   HGB 15.1 04/30/2015   HCT 44.7 04/30/2015   MCV 81.4 04/30/2015   PLT 180.0 04/30/2015      Assessment & Plan:  Hypogonadism, improved.  Polycythemia: resolved, but this hx limits testosterone dosage.    Patient is advised the following: Patient Instructions  blood tests are requested for you today.  We'll let you know about the results.  Please come back for a follow-up appointment in 6 months

## 2015-04-30 NOTE — Progress Notes (Signed)
Subjective:    Patient ID: Jesse Bowers, male    DOB: 24-Oct-1965, 49 y.o.   MRN: [id number]  HPI: [name] is a 50 year old male who returns for follow-up and medication refill. He says his pain is located in his bilateral shoulders, lower-back, bilateral knees and bilateral feet. He rates his pain 7. His current exercise regime is performing Yoga twice a week and walking with his walker short distances.  Pain Inventory Average Pain 8 Pain Right Now 7 My pain is constant, sharp, stabbing and tingling  In the last 24 hours, has pain interfered with the following? General activity 9 Relation with others 9 Enjoyment of life 9 What TIME of day is your pain at its worst? morning, night Sleep (in general) Poor  Pain is worse with: walking, bending, sitting and standing Pain improves with: rest, heat/ice, medication and injections Relief from Meds: 5  Mobility walk without assistance use a walker how many minutes can you walk? 10 ability to climb steps?  yes use a wheelchair transfers alone Do you have any goals in this area?  yes  Function disabled: date disabled . I need assistance with the following:  feeding, dressing, bathing, meal prep, household duties and shopping Do you have any goals in this area?  no  Neuro/Psych weakness numbness trouble walking spasms confusion anxiety  Prior Studies Any changes since last visit?  no  Physicians involved in your care Any changes since last visit?  no   Family History  Problem Relation Age of Onset  . Stroke Paternal Uncle     age 90  . Stroke Maternal Grandfather     39  . Hypertension Mother   . Psoriasis Mother   . Stroke Maternal Grandmother   . Congestive Heart Failure Maternal Grandmother   . Cancer Paternal Grandfather   . Protein C deficiency Sister 56    Miscarriages   Social History   Social History  . Marital Status: Married    Spouse Name: N/A  . Number of Children: 2  . Years of  Education: 4y college   Occupational History  . 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 Smoking status: Never Smoker   . Smokeless tobacco: None  . Alcohol Use: Yes     Comment: wine once a month  . Drug Use: No  . Sexual Activity: Yes    Birth Control/ Protection: None     Comment: patient is a transgender on testosterone shots, no biological kids   Other Topics Concern  . None   Social History Narrative   Education 4 year college, former Therapist, sports X 15 years, pediatric nurse practitioner x 6 years, did NP degree from [employer] [place]. Relocated to [place] about 2 months ago from [place], [place]. Patient was in [place] for last 4 years and prior to that in [place]. His wife is working as Scientist, research (physical sciences) for [employer]. Patient is not working and applying for disability. They have 2 kids but no biologic children.    Past Surgical History  Procedure Laterality Date  . Thyroidectomy, partial  2008  . Cholecystectomy    . Ankle arthroscopy with reconstruction Right 2007  . Knee surgery Bilateral 1984    Right ACL, left PCL repair  . Liver biopsy  2013    normal results.  . Hip arthroscopy w/ labral repair Right 05/11/2013   Past Medical History  Diagnosis  Date  . psoriatic arthritis   . H/O protein C deficiency   . Hx-TIA (transient ischemic attack)   . Clotting disorder ([place])   . Celiac disease   . H/O parotitis     right   . Polycythemia, secondary   . Transgendered   . Sleep apnea   . Neck pain   . Abnormal weight loss   . Gluten enteropathy   . Syrinx of spinal cord ([place]) 01/06/2014 on MRI    c spine  . Anxiety     PTSD per patient   BP 118/83 mmHg  Pulse 120  Resp 14  SpO2 95%  Opioid Risk Score:   Fall Risk Score:  `1  Depression screen PHQ 2/9  Depression screen PHQ 2/9 06/12/2014  Decreased Interest 2  Down, Depressed, Hopeless 0  PHQ - 2 Score 2  Altered sleeping 3  Tired, decreased energy 3    Change in appetite 0  Feeling bad or failure about yourself  0  Trouble concentrating 3  Moving slowly or fidgety/restless 0  Suicidal thoughts 0  PHQ-9 Score 11     Review of Systems  All other systems reviewed and are negative.      Objective:   Physical Exam  Constitutional: He is oriented to person, place, and time. He appears well-developed and well-nourished.  HENT:  Head: Normocephalic and atraumatic.  Neck: Normal range of motion. Neck supple.  Cardiovascular: Normal rate and regular rhythm.   Pulmonary/Chest: Effort normal and breath sounds normal.  Musculoskeletal:  Normal Muscle Bulk and Muscle Testing Reveals: Upper Extremities: Full ROM and Muscle Strength 4/5 Lumbar Paraspinal Tenderness: L-3- L-5 Lower Extremities: Full ROM and Muscle Strength 5/5 Bilateral Lower Extremities Flexion Produces Pain into Patella's and Right foot Arrived in wheelchair   Neurological: He is alert and oriented to person, place, and time.  Skin: Skin is warm and dry.  Psychiatric: He has a normal mood and affect.  Nursing note and vitals reviewed.         Assessment & Plan:  1. Psoriatic arthritis with pain in multiple areas, most prominently feet, hands, elbows.: Refilled:Kadian 60 mg 24 hr. Capsule, one capsule every 12 hours #60 and Continue Oxycodone 10 mg one tablet every 8 hours as needed #90. No script given for oxycodone Rheumatology Following. 2. 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 Patello-femoral arthritis left knee: Continue Voltaren Gel S/P TKR on 07/03/14: Ortho Following 4. Chronic mid- low back pain: Continue current medication regime, and encourage to increase activity as tolerated. 5. Polycythemia: Oncology Following.  6. Depression with anxiety : Continue Effexor   20 minutes of face to face patient care time was spent  during this visit. All questions were encouraged and answered.

## 2015-05-01 ENCOUNTER — Ambulatory Visit (INDEPENDENT_AMBULATORY_CARE_PROVIDER_SITE_OTHER): Payer: BLUE CROSS/BLUE SHIELD | Admitting: Psychology

## 2015-05-01 ENCOUNTER — Encounter: Payer: Self-pay | Admitting: Endocrinology

## 2015-05-01 DIAGNOSIS — F4310 Post-traumatic stress disorder, unspecified: Secondary | ICD-10-CM | POA: Diagnosis not present

## 2015-05-01 LAB — TESTOSTERONE,FREE AND TOTAL
TESTOSTERONE FREE: 3 pg/mL — AB (ref 6.8–21.5)
Testosterone: 212 ng/dL — ABNORMAL LOW (ref 348–1197)

## 2015-05-02 NOTE — Addendum Note (Signed)
Addended by: Renato Shin on: 05/02/2015 09:52 AM   Modules accepted: Level of Service

## 2015-05-03 ENCOUNTER — Telehealth: Payer: Self-pay | Admitting: Physical Medicine & Rehabilitation

## 2015-05-03 ENCOUNTER — Ambulatory Visit: Payer: BLUE CROSS/BLUE SHIELD | Admitting: Psychology

## 2015-05-03 NOTE — Telephone Encounter (Signed)
I have replied telling him that I will have his paperwork ready for you to sign next week. Although he sent it in November, we were waiting on an FCE and I didn't realize it was completed until recently.  But I am attaching his entire email:  Dr. Naaman Bowers,  Thanks for the cream. This one seems to be working better with no rash, so far & it was enuf to break the cycle. I won't say I'm pain free by any means but that cream hits those arthritis spots whith a whack never had before. II talked to my hematologist and she doesn't want me taking any NSAIDs because it makes my xeralto ineffective against stroke prevention. And while it's not visible on a MRI, she still believes that the Protein C level and the polycemia (since birth) and high RDW's when I had my stroke was the cause of those three things and not my Testosterone levels. And because it was fast, it may not be able to be seen but based on my thing from Dr. Valentina Bowers, Definitely have stroke symptoms remaining and thus despite what MRI says, testing and behaviors and abilities that are of high validity, prove I had a stroke, even if it can't be seen by MRI, which I can't every have again because of my knee. But the point is, I still can't work, ever again. 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 Those things, along with your letter and form completed will i think make everything go thru faster. there is going to be a [employer] person calling you asking your opinion on if i can work.   I can barely get thru a day without being confused. I was out of morphine and testosterone for about a week because they weren't in stock and definitely, they are needed. Everything is connected. The PTSD is a clinical diagnosis and that alone should prove to be snuf to get SSDI but apparently not so I need your help. My PCP won't do it because she always defers to the specialists but she knows I can't work either. I'm not  just sitting around eating all day and out shopping> my rheum ordered me an ultra lightweight wheelchair because the walker isn't enough and I don't have the stamina to walk with out it and since my LKA is essentially a failure and is worse than before, I need the chair. My right side was weaker but is a bit better now because of the PT for my left knee. I still get PTSD symptoms, i still can't multitask, i'm unable to complete my own ad's sometimes i drive, my [employer] said that's ok for appts because now i have been labeled a home bound adult. I have no one here with me all day and unless my wife makes me a lunch, i don't eat anything until someone is home. or I eat fruit to fill me up because we have it. I still need help with bathing because of my left knee is worse and getting in and out of the shower is harder. The wheelchair,tho helpful for my knees and feet and toes and ankles for arthritis is great. but its really been hard on my shoulders. my elbow had a significant flare on it a couple of months ago, it comes and goes just like a flare but it lasted about 8 weeks until i finally got it controlled. Took an joint injection and  4 tendon injection on inside and outside of the elbow.n my wrists and fingers are hurting because of the wheelchair but i had splints made in [place] before i left due to arthritis pain so i use those when using the chair. I don't want to be dependent on it. but these past 1-2 weeks, my left knee has been my primary source of pain and everything else following. I'd say my shoulders are next. I also have that thing in my spinal cord that makes sleeping impossible because my arms fall asleep and my hand do too. plus the knee pain. I see a therapist twice a week a psychiatrist from [facility] once monthly, I'm on other meds for anxiety and PTSD treatment along with the arthritis treatment. I was told by the cosyntex people that when they switch medications from biological to biological flares and  pain are heightened. That's what was going on when I saw you last, since then, the med has slowed down the pain a bit and the rashes are getting better. but as I get closer to it being due, pain increases as do rashes. when that happens, It harder than usual to do anything effectively.   I missed an appointment due to confusion of dates, I never miss appts, I missed another appt because I left late. My time management skills are crap now, I can't do anything to make it better yet. I'm hoping Dr. Felecia Bowers reads the report (neuro) and gets me some help with my brain function but last I heard, this is as good as it's going to get, it could get worse, but it isn't going to get better, per Jesse Bowers. So I need to find new ways to make things work for me. so my wife does everything, shopping, helps me bathe, helps me to that BR at night, does all finanaces now, does all shopping now. Does everything. I have a 50yo daughter and she takes care of me too. That's not right and i feel super guilty about it too. But the point is, I ramble like crazy, i can't stay focused, this took 3 hours to get out and its still not write. I have 2 Masters. Know how to do stuff, but I can't organize my brain, let alone anything else. when my wife leaves due to work, one of my parents have to come up to care for ME. I should be taking care of them but its me being taken care of again. 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 I'd love to work again I just can't. I can barely keep it together during the day, if something is too much, I over load and can't agree or decide what to do anymore. My strength is crap, my brain is crap and my life is pretty much crap right now. My biggest worry is that I won't get SSDI, which I have so many dx that they can't seem to agree on one to  stick with and thus I wait. I'm waiting for administrative law judge to hear my case with my attorney. None of my physicians will be called to the hearing. It will be me and the my attorney only. So you don't have to worry about anything, per [employer] or [employer].   I do drive  sometimes, when an emergency and Almost always without medications in my system, I wait to take until I'm home after dropping kid off and I have to use the GPS even though she's been going there for two years., I sometimes have to take my kid to school. (she has autism and can't do the bus but is very high guy in a lot of pain and nothing was working till that cream broke the circle. Then the rash and now a flare up again and now a new cream that helps a bit but I'm wondering exactly how much am I supposed to use a day. It doesn't give a ML amount, just use TID as directed. I do need shoulders injected but i see Zella Ball this month on the 23 of March. But anyway, please please please if you can find a way to get this done, soon. since it's already been so long and now I have people calling and asking me about it. Please get it done soon just for me, I'l bring you a candy bar or something. I know you guys are swamped and i'm a pain in the ass but i don't mean to be.And I'm not  seeking or working the sytsem, I don't even know how to do that but I was just a but only before my meds. I take the [place] when I get home and if she doesn't go home with someone else or my wife doesn't get her, I have to do it, or get to an appt because my wife can't go or get one of her friends to take me and drop me off and call them when I'm ready to go again. But legally through [place], I do meet theiir homebound adult status tho idk what that means, I do no chores around the house, I don't cook, clean, I have no dogs any more so Idon't have to walk them, I'm basically just a guy in a shell of a body. I don't have depression, but very high anxiety and PTSD as well as now  some very social anxiety and almost agoraphobia. but Ineed the form done so I can get things moving for me and my insurance [employer] term payments I get thru them since I was a PNP and opted for LTD options so they've been paying me and are very interested in me getting SSDI as well as their payment will be cut in half as well. So if you don't mind, please sir, help a follow previous medical colleague out and get my paperwork completeds soon. It will help more than you know. I i just want to know that we're ok too. you seemed kind of peeved at me last time because nothing was working but it was because the biological hadn't kicked in yet, I've been on it four weeks now and it's helping a little and def makes the [place] work better too. so I 'm sorry if I pissed you off. I wasn't trying. I  just let me know when this will be done so I can scan and get it sent out to all my listed people [place], [place], SSDI, and [employer] my attorney group.    Thanks, Jesse Bowers

## 2015-05-08 ENCOUNTER — Ambulatory Visit (INDEPENDENT_AMBULATORY_CARE_PROVIDER_SITE_OTHER): Payer: BLUE CROSS/BLUE SHIELD | Admitting: Psychology

## 2015-05-08 DIAGNOSIS — F4310 Post-traumatic stress disorder, unspecified: Secondary | ICD-10-CM

## 2015-05-08 NOTE — Telephone Encounter (Signed)
[name] returned two prescriptions today. One dated October 27, 2014 Oxycodone 10 mg #60.The other prescription was dated 02/28/15 Oxycodone 51m # 90. Prescriptions were discarded.

## 2015-05-10 ENCOUNTER — Ambulatory Visit (INDEPENDENT_AMBULATORY_CARE_PROVIDER_SITE_OTHER): Payer: BLUE CROSS/BLUE SHIELD | Admitting: Psychology

## 2015-05-10 ENCOUNTER — Ambulatory Visit (INDEPENDENT_AMBULATORY_CARE_PROVIDER_SITE_OTHER): Payer: BLUE CROSS/BLUE SHIELD | Admitting: Neurology

## 2015-05-10 ENCOUNTER — Encounter: Payer: Self-pay | Admitting: Neurology

## 2015-05-10 VITALS — BP 130/88 | HR 74 | Resp 16 | Ht 68.0 in | Wt 201.2 lb

## 2015-05-10 DIAGNOSIS — F640 Transsexualism: Secondary | ICD-10-CM | POA: Diagnosis not present

## 2015-05-10 DIAGNOSIS — G950 Syringomyelia and syringobulbia: Secondary | ICD-10-CM | POA: Diagnosis not present

## 2015-05-10 DIAGNOSIS — G894 Chronic pain syndrome: Secondary | ICD-10-CM

## 2015-05-10 DIAGNOSIS — G4730 Sleep apnea, unspecified: Secondary | ICD-10-CM | POA: Diagnosis not present

## 2015-05-10 DIAGNOSIS — Z789 Other specified health status: Secondary | ICD-10-CM

## 2015-05-10 DIAGNOSIS — F4310 Post-traumatic stress disorder, unspecified: Secondary | ICD-10-CM

## 2015-05-10 DIAGNOSIS — Z862 Personal history of diseases of the blood and blood-forming organs and certain disorders involving the immune mechanism: Secondary | ICD-10-CM

## 2015-05-10 DIAGNOSIS — F418 Other specified anxiety disorders: Secondary | ICD-10-CM

## 2015-05-10 DIAGNOSIS — R413 Other amnesia: Secondary | ICD-10-CM | POA: Diagnosis not present

## 2015-05-10 DIAGNOSIS — G4733 Obstructive sleep apnea (adult) (pediatric): Secondary | ICD-10-CM

## 2015-05-10 MED ORDER — CYCLOBENZAPRINE HCL 5 MG PO TABS: 5.0000 mg | ORAL_TABLET | Freq: Every day | ORAL | Status: DC

## 2015-05-10 NOTE — Progress Notes (Signed)
[facility]  PATIENT: Jesse Bowers DOB: [birth year]     HISTORICAL  CHIEF COMPLAINT:  Chief Complaint  Patient presents with  . Numbness    Sts. he has numbness in upper back, feet.  Sts. olfactory hallucinations are resolved for now.  Sts. he no longer treats osa (couldn't find a mask that fit).  Today he has new c/o decreased hearing right ear.  He is in a w/c today--sts. he's had left knee replacement that didn't go well./fim  . Memory Loss  . Olfactory Hallucinations  . Sleep Apnea    HISTORY OF PRESENT ILLNESS:  Jesse Bowers is a 50 year old transgenderer male with multiple complaints including numbness, olfactory hallucinations and memory loss.  Knee:   He had recent knee surgery but reports complications.  Syrinx/numbness:   He has tingling in his hands, left more than right.   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   He reports a lot of lot of numbness in the back and arms.   He also states he has woken up with severe arm weakness some mornings.    He reports a lot of pain as well as the numbness, mostly in the back.  Memory:   SHe reports short term memory issues and reports stuttering.   Stuttering is worse when more anxious.     He notes a lot of difficulty focusing.     He does not remember if he has done things like shower that day or turn something off.   Psych issues:   He sees [facility]. Erling Cruz at [facility].   He reports PTSD and anxiety.     Night time cyclobenzapine has helped his sleep and mood related issues.    OSA:   He notes chronic fatigue.   He has OSA and has trouble wearing the mask the entire night.  He was on BiPAP but has had trouble getting a mask to fit.  A full face mask caused severe dry mouth and even with a chin strap, nasal PAP was not tolerated.     He states his wife has not noticed snoring or apnea recently.   He reportedly had severe OSA diagnosed in [place] 6 months ago.   Olfactory Hallucinations:   These are better.    A CT sinus was  normal.    He does not note any changes in taste.    No recent URI.    He has history of seizures as a child. He was on phenobarbital for a couple years.  Chronic pain:   He sees Dr. Ephriam Knuckles (PMR).   He has chronic pain helped a lot by a numbing ointment and Morphine.    He also reports his right foot turns in as he walks and PMR is setting him up for an AFO.    He is on Cosyntex for psoriatic arthritis.   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    His MRI brain last year showed some mild predominantly peripheral hyperintense foci that are non-specific.  A relationship to the Prt C deficiency is possible.     EPWORTH SLEEPINESS SCALE  On a scale of 0 - 3 what is the chance of dozing:  Sitting and Reading:   2 Watching TV:    3 Sitting inactive in a public place: 0 Passenger in car for one hour: 3 Lying down  to rest in the afternoon: 3 Sitting and talking to someone: 0 Sitting quietly after lunch:  2 In a car, stopped in traffic:  0  Total (out of 24):    13   (mild to moderate EDS)   REVIEW OF SYSTEMS:  Constitutional: Has fatigue.  See above.   No fevers, chills, sweats, or change in appetite Eyes: Occ episodes of blurred vision.   No eye pain Ear, nose and throat: No hearing loss, ear pain, nasal congestion, sore throat Cardiovascular: No chest pain.   He notes occ. palpitations Respiratory:  No shortness of breath at rest or with exertion.   No wheezes GastrointestinaI: No nausea, vomiting, diarrhea, abdominal pain, fecal incontinence Genitourinary:  No dysuria, urinary retention or frequency.  No nocturia. Musculoskeletal:  He has psoriatic arthritis with joint pain/swelling. Integumentary:has psoriasis Neurological: as above Psychiatric: No depression at this time.  No anxiety Endocrine: He is transgender and on chronic testosterone therapy.   Reports heat intolerance.  No palpitations,  diaphoresis, change in appetite, change in weigh or increased thirst Hematologic/Lymphatic:  No anemia, purpura, petechiae. Allergic/Immunologic: No itchy/runny eyes, nasal congestion, recent allergic reactions, rashes  ALLERGIES: Allergies  Allergen Reactions  . Penicillin G Anaphylaxis  . Vancomycin Rash  . Wheat Extract Other (See Comments)    CELIAC DISEASE  . Duloxetine     Restless legs  . Gabapentin Nausea Only  . Sulfa Antibiotics Rash    Stevens-Johnson rash    HOME MEDICATIONS: Outpatient Prescriptions Prior to Visit  Medication Sig Dispense Refill  . clorazepate (TRANXENE) 7.5 MG tablet Take one tablet by mouth three times daily (Patient taking differently: Take 7.5 mg by mouth 4 (four) times daily. Take one tablet by mouth three times daily) 90 tablet 0  . cyclobenzaprine (FLEXERIL) 5 MG tablet Take 1 tablet (5 mg total) by mouth at bedtime. 30 tablet 0  . diclofenac sodium (VOLTAREN) 1 % GEL Apply 1 application topically 3 (three) times daily. To feet, hands, knees, elbows (Patient taking differently: Apply 1 application topically as needed. To feet, hands, knees, elbows) 3 Tube 4  . morphine (KADIAN) 60 MG 24 hr capsule Take 1 capsule (60 mg total) by mouth every 12 (twelve) hours. 60 capsule 0  . NONFORMULARY OR COMPOUNDED ITEM Apply 1 application topically 3 (three) times daily. 8% ketamine, 5% amitriptyline, 5% baclofen, 5% gabapentin  60 GM 1 each 5  . Oxycodone HCl 10 MG TABS Take 1 tablet (10 mg total) by mouth every 8 (eight) hours as needed. 90 tablet 0  . prazosin (MINIPRESS) 2 MG capsule Take 6 mg by mouth at bedtime.    . rivaroxaban (XARELTO) 20 MG TABS tablet Take 1 tablet (20 mg total) by mouth daily with supper. 30 tablet 5  . Secukinumab 150 MG/ML SOAJ Inject into the skin. Inject every 28 days.    [name] testosterone cypionate (DEPO-TESTOSTERONE) 200 MG/ML injection Inject 0.25 mLs (50 mg total) into the muscle once a week. 10 mL 0  . venlafaxine (EFFEXOR)  75 MG tablet Take 150 mg by mouth every morning.     . polyethylene glycol (MIRALAX / GLYCOLAX) packet Take 17 g by mouth as needed. Reported on 05/10/2015     No facility-administered medications prior to visit.    PAST MEDICAL HISTORY: Past Medical History  Diagnosis Date  . psoriatic arthritis   . H/O protein C deficiency   . Hx-TIA (transient ischemic attack)   . Clotting disorder ([place])   . Celiac  disease   . H/O parotitis     right   . Polycythemia, secondary   . Transgendered   . Sleep apnea   . Neck pain   . Abnormal weight loss   . Gluten enteropathy   . Syrinx of spinal cord ([place]) 01/06/2014 on MRI    c spine  . Anxiety     PTSD per patient    PAST SURGICAL HISTORY: Past Surgical History  Procedure Laterality Date  . Thyroidectomy, partial  2008  . Cholecystectomy    . Ankle arthroscopy with reconstruction Right 2007  . Knee surgery Bilateral 1984    Right ACL, left PCL repair  . Liver biopsy  2013    normal results.  . Hip arthroscopy w/ labral repair Right 05/11/2013    FAMILY HISTORY: Family History  Problem Relation Age of Onset  . Stroke Paternal Uncle     age 39  . Stroke Maternal Grandfather     20  . Hypertension Mother   . Psoriasis Mother   . Stroke Maternal Grandmother   . Congestive Heart Failure Maternal Grandmother   . Cancer Paternal Grandfather   . Protein C deficiency Sister 20    Miscarriages    SOCIAL HISTORY:  Social History   Social History  . Marital Status: Married    Spouse Name: N/A  . Number of Children: 2  . 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 Smoking status: Never Smoker   . Smokeless tobacco: Not on file  . Alcohol Use: Yes     Comment: wine once a month  . Drug Use: No  . Sexual Activity: Yes    Birth Control/ Protection: None     Comment: patient is a transgender on testosterone shots, no  biological kids   Other Topics Concern  . 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 Relocated to [place] about 2 months ago from [place], [place]. Patient was in [place] for last 4 years and prior to that in [place]. His wife is working as Scientist, research (physical sciences) for [employer]. Patient is not working and applying for disability. They have 2 kids but no biologic children.      PHYSICAL EXAM  Filed Vitals:   05/10/15 1307  BP: 130/88  Pulse: 74  Resp: 16  Height: 5' 8"  (1.727 m)  Weight: 201 lb 3.2 oz (91.264 kg)    Body mass index is 30.6 kg/(m^2).   General: The patient is well-developed and well-nourished and in no acute distress  Eyes:  Funduscopic exam is difficult due to small pupils from opiate therapy.    Throat:   No erythema.  No elongation of palate  Neck: The neck is supple, no carotid bruits are noted.  The neck is nontender.   Cardiovascular: The cardiovascular examination reveals a regular rate and rhythm, no murmurs, gallops or rubs are noted.  Skin: Extremities are without significant edema.  Neurologic Exam  Mental status: The patient is alert and oriented x 3 at the time of the examination. He answers questions appropriately.   The patient has apparent normal recent and remote memory, with an apparently normal attention span and concentration ability.   Speech is normal.  Cranial nerves: Extraocular  movements are full.   Facial symmetry is present.He reports decreased right facial sensation.   Facial strength is normal.  Trapezius and sternocleidomastoid strength is normal. No dysarthria is noted.  The tongue is midline, and the patient has symmetric elevation of the soft palate. No obvious hearing deficits are noted.  Motor:  Muscle bulk and tone are normal. Strength is  5 / 5 in all 4 extremities.   Sensory: He reports reduced sensory to  touch and vibration in the hands, worse on right and in upper back centrally.   .  Coordination: Cerebellar testing reveals good finger-nose-finger and heel-to-shin bilaterally.  Gait and station: Station is normal and feet are inverted as he walks, mild right steppagait mildly arthritic (recent surgery).      Tandem gait is wide. Romberg is negative.   Reflexes: Deep tendon reflexes are symmetric and normal bilaterally. Plantar responses are normal.    DIAGNOSTIC DATA (LABS, IMAGING, TESTING) - I reviewed patient records, labs, notes, testing and imaging myself where available.  Lab Results  Component Value Date   WBC 3.9* 04/30/2015   HGB 15.1 04/30/2015   HCT 44.7 04/30/2015   MCV 81.4 04/30/2015   PLT 180.0 04/30/2015    Lab Results  Component Value Date   VITAMINB12 320 12/26/2013   Lab Results  Component Value Date   TSH 0.778 01/24/2014        ASSESSMENT AND PLAN  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[place])  H/O protein C deficiency  Sleep apnea  Transgender  OSA (obstructive sleep apnea) - Plan: Split night study  Chronic pain syndrome  Depression with anxiety  Memory loss   1.   Set up Split night for OSA.   He was on BiPAP in past.   OSA likely playing a role in fatigue, sleepines and memory difficulty 2.   Continue cyclobenzaprine for insomnia and PTSD 3.   He has symptoms of numbness in the hands that are most likely related to the syrinx in the cervical spine. As long as this does not progress I would not command surgery. However, if he starts to progress we will need to refer him to neurosurgery for another opinion.  4.   He will return to see me in 3 months or sooner if he has new or worsening neurologic symptoms.  40 minutes face-to-face with greater than 50% of the time counseling correlating care about his multiple neurologic symptoms.   Richard A. [name], MD, PhD 03/11/1476, 2:95 PM Certified in Neurology, Clinical Neurophysiology, Sleep Medicine, Pain Medicine  and Neuroimaging  [facility] [address] [place], [place] [address] [phone]

## 2015-05-15 ENCOUNTER — Ambulatory Visit: Payer: BLUE CROSS/BLUE SHIELD | Admitting: Psychology

## 2015-05-17 ENCOUNTER — Ambulatory Visit: Payer: BLUE CROSS/BLUE SHIELD | Admitting: Psychology

## 2015-05-17 ENCOUNTER — Ambulatory Visit (INDEPENDENT_AMBULATORY_CARE_PROVIDER_SITE_OTHER): Payer: BLUE CROSS/BLUE SHIELD | Admitting: Psychology

## 2015-05-17 DIAGNOSIS — F4310 Post-traumatic stress disorder, unspecified: Secondary | ICD-10-CM | POA: Diagnosis not present

## 2015-05-21 ENCOUNTER — Ambulatory Visit (INDEPENDENT_AMBULATORY_CARE_PROVIDER_SITE_OTHER): Payer: BLUE CROSS/BLUE SHIELD | Admitting: Psychology

## 2015-05-21 ENCOUNTER — Ambulatory Visit: Payer: Self-pay | Admitting: Hematology

## 2015-05-21 ENCOUNTER — Other Ambulatory Visit: Payer: Self-pay

## 2015-05-21 DIAGNOSIS — F4310 Post-traumatic stress disorder, unspecified: Secondary | ICD-10-CM | POA: Diagnosis not present

## 2015-05-22 ENCOUNTER — Ambulatory Visit: Payer: BLUE CROSS/BLUE SHIELD | Admitting: Psychology

## 2015-05-24 ENCOUNTER — Encounter: Payer: BLUE CROSS/BLUE SHIELD | Attending: Physical Medicine & Rehabilitation | Admitting: Registered Nurse

## 2015-05-24 ENCOUNTER — Encounter: Payer: Self-pay | Admitting: Registered Nurse

## 2015-05-24 ENCOUNTER — Ambulatory Visit (INDEPENDENT_AMBULATORY_CARE_PROVIDER_SITE_OTHER): Payer: BLUE CROSS/BLUE SHIELD | Admitting: Psychology

## 2015-05-24 VITALS — BP 125/80 | HR 105

## 2015-05-24 DIAGNOSIS — F4310 Post-traumatic stress disorder, unspecified: Secondary | ICD-10-CM | POA: Diagnosis not present

## 2015-05-24 DIAGNOSIS — M7522 Bicipital tendinitis, left shoulder: Secondary | ICD-10-CM | POA: Insufficient documentation

## 2015-05-24 DIAGNOSIS — F418 Other specified anxiety disorders: Secondary | ICD-10-CM | POA: Diagnosis not present

## 2015-05-24 DIAGNOSIS — S43002A Unspecified subluxation of left shoulder joint, initial encounter: Secondary | ICD-10-CM | POA: Insufficient documentation

## 2015-05-24 DIAGNOSIS — G894 Chronic pain syndrome: Secondary | ICD-10-CM | POA: Diagnosis not present

## 2015-05-24 DIAGNOSIS — Z87890 Personal history of sex reassignment: Secondary | ICD-10-CM | POA: Insufficient documentation

## 2015-05-24 DIAGNOSIS — Z79891 Long term (current) use of opiate analgesic: Secondary | ICD-10-CM | POA: Insufficient documentation

## 2015-05-24 DIAGNOSIS — K900 Celiac disease: Secondary | ICD-10-CM | POA: Insufficient documentation

## 2015-05-24 DIAGNOSIS — I69320 Aphasia following cerebral infarction: Secondary | ICD-10-CM | POA: Diagnosis not present

## 2015-05-24 DIAGNOSIS — Z5181 Encounter for therapeutic drug level monitoring: Secondary | ICD-10-CM | POA: Diagnosis not present

## 2015-05-24 DIAGNOSIS — Z7901 Long term (current) use of anticoagulants: Secondary | ICD-10-CM | POA: Diagnosis not present

## 2015-05-24 DIAGNOSIS — D751 Secondary polycythemia: Secondary | ICD-10-CM | POA: Insufficient documentation

## 2015-05-24 DIAGNOSIS — M545 Low back pain: Secondary | ICD-10-CM | POA: Insufficient documentation

## 2015-05-24 DIAGNOSIS — L4050 Arthropathic psoriasis, unspecified: Secondary | ICD-10-CM | POA: Diagnosis not present

## 2015-05-24 DIAGNOSIS — Z79899 Other long term (current) drug therapy: Secondary | ICD-10-CM

## 2015-05-24 DIAGNOSIS — I69351 Hemiplegia and hemiparesis following cerebral infarction affecting right dominant side: Secondary | ICD-10-CM | POA: Insufficient documentation

## 2015-05-24 DIAGNOSIS — D6859 Other primary thrombophilia: Secondary | ICD-10-CM | POA: Insufficient documentation

## 2015-05-24 DIAGNOSIS — R209 Unspecified disturbances of skin sensation: Secondary | ICD-10-CM | POA: Insufficient documentation

## 2015-05-24 DIAGNOSIS — Z96652 Presence of left artificial knee joint: Secondary | ICD-10-CM

## 2015-05-24 DIAGNOSIS — Z9181 History of falling: Secondary | ICD-10-CM | POA: Insufficient documentation

## 2015-05-24 DIAGNOSIS — M13862 Other specified arthritis, left knee: Secondary | ICD-10-CM | POA: Diagnosis not present

## 2015-05-24 DIAGNOSIS — G8101 Flaccid hemiplegia affecting right dominant side: Secondary | ICD-10-CM

## 2015-05-24 DIAGNOSIS — M7712 Lateral epicondylitis, left elbow: Secondary | ICD-10-CM

## 2015-05-24 DIAGNOSIS — G8929 Other chronic pain: Secondary | ICD-10-CM | POA: Insufficient documentation

## 2015-05-24 DIAGNOSIS — Z86718 Personal history of other venous thrombosis and embolism: Secondary | ICD-10-CM | POA: Insufficient documentation

## 2015-05-24 DIAGNOSIS — I69398 Other sequelae of cerebral infarction: Secondary | ICD-10-CM | POA: Insufficient documentation

## 2015-05-24 MED ORDER — MORPHINE SULFATE ER 60 MG PO CP24: 60.0000 mg | ORAL_CAPSULE | Freq: Two times a day (BID) | ORAL | Status: DC

## 2015-05-24 NOTE — Progress Notes (Signed)
Subjective:    Patient ID: Jesse Bowers, male    DOB: 12-Jul-1965, 50 y.o.   MRN: [id number]  HPI: [name] is a 50 year old male who returns for follow-up and medication refill. He states his pain is located in his left elbow, lower-back, left knee, right ankle and bilateral toes. He rates his pain 7. His current exercise regime is performing Yoga twice a week and walking with his walker short distances.  Pain Inventory Average Pain 7 Pain Right Now 7 My pain is constant, sharp, dull, stabbing and tingling  In the last 24 hours, has pain interfered with the following? General activity 10 Relation with others 10 Enjoyment of life 9 What TIME of day is your pain at its worst? morning evening night Sleep (in general) Poor  Pain is worse with: walking, sitting, inactivity, standing and some activites Pain improves with: rest, heat/ice, medication, TENS and injections Relief from Meds: 6  Mobility walk with assistance use a walker how many minutes can you walk? 5 ability to climb steps?  yes do you drive?  yes use a wheelchair transfers alone  Function not employed: date last employed 05/06/03 disabled: date disabled 02/19/13 I need assistance with the following:  feeding, dressing, bathing, toileting, meal prep, household duties and shopping  Neuro/Psych bladder control problems weakness numbness trouble walking spasms confusion anxiety  Prior Studies Any changes since last visit?  no  Physicians involved in your care Any changes since last visit?  no   Family History  Problem Relation Age of Onset  . Stroke Paternal Uncle     age 66  . Stroke Maternal Grandfather     44  . Hypertension Mother   . Psoriasis Mother   . Stroke Maternal Grandmother   . Congestive Heart Failure Maternal Grandmother   . Cancer Paternal Grandfather   . Protein C deficiency Sister 20    Miscarriages   Social History   Social History  . Marital Status: Married   Spouse Name: N/A  . Number of Children: 2  . 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 Smoking status: Never Smoker   . Smokeless tobacco: None  . Alcohol Use: Yes     Comment: wine once a month  . Drug Use: No  . Sexual Activity: Yes    Birth Control/ Protection: None     Comment: patient is a transgender on testosterone shots, no biological kids   Other Topics Concern  . 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 Relocated to [place] about 2 months ago from [place], [place]. Patient was in [place] for last 4 years and prior to that in [place]. His wife is working as Scientist, research (physical sciences) for [employer]. Patient is not working and applying for disability. They have 2 kids but no biologic children.    Past Surgical History  Procedure Laterality Date  . Thyroidectomy, partial  2008  . Cholecystectomy    . Ankle arthroscopy with reconstruction Right 2007  . Knee surgery Bilateral 1984    Right ACL, left PCL repair  . Liver biopsy  2013    normal results.  . Hip arthroscopy w/ labral repair Right 05/11/2013   Past Medical History  Diagnosis  Date  . psoriatic arthritis   . H/O protein C deficiency   . Hx-TIA (transient ischemic attack)   . Clotting disorder ([place])   . Celiac disease   . H/O parotitis     right   . Polycythemia, secondary   . Transgendered   . Sleep apnea   . Neck pain   . Abnormal weight loss   . Gluten enteropathy   . Syrinx of spinal cord ([place]) 01/06/2014 on MRI    c spine  . Anxiety     PTSD per patient   BP 125/80 mmHg  Pulse 105  SpO2 96%  Opioid Risk Score:   Fall Risk Score:  `1  Depression screen PHQ 2/9  Depression screen [facility] 2/9 05/24/2015 06/12/2014  Decreased Interest 2 2  Down, Depressed, Hopeless 0 0  PHQ - 2  Score 2 2  Altered sleeping - 3  Tired, decreased energy - 3  Change in appetite - 0  Feeling bad or failure about yourself  - 0  Trouble concentrating - 3  Moving slowly or fidgety/restless - 0  Suicidal thoughts - 0  PHQ-9 Score - 11    Review of Systems  Constitutional: Positive for unexpected weight change.  HENT:       Hearing loss right side  Cardiovascular: Positive for leg swelling.  All other systems reviewed and are negative.      Objective:   Physical Exam  Constitutional: He is oriented to person, place, and time. He appears well-developed and well-nourished.  HENT:  Head: Normocephalic and atraumatic.  Neck: Normal range of motion. Neck supple.  Cardiovascular: Normal rate and regular rhythm.   Pulmonary/Chest: Effort normal and breath sounds normal.  Musculoskeletal:  Normal Muscle Bulk and Muscle Testing Reveals: Upper Extremities: Full ROM and Muscle Strength 4/5 Thoracic Paraspinal Tenderness: T-9- T-11 Lower Extremities: Full ROM and Muscle Strength 4/5 Right Lower Extremity Flexion Produces pain into Foot Left Lower Extremity Flexion Produces Pain into Popliteal Fossa and Patella Arrived in wheelchair  Neurological: He is alert and oriented to person, place, and time.  Skin: Skin is warm and dry.  Psychiatric: He has a normal mood and affect.  Nursing note and vitals reviewed.         Assessment & Plan:  1. Psoriatic arthritis with pain in multiple areas, most prominently feet, hands, elbows.: Refilled:Kadian 60 mg 24 hr. Capsule, one capsule every 12 hours #60 and Continue Oxycodone 10 mg one tablet every 8 hours as needed #90. No script given for oxycodone Rheumatology Following. 2. 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 Patello-femoral arthritis left knee: Continue Voltaren Gel S/P TKR on 07/03/14: Ortho  Following 4. Chronic mid- low back pain: Continue current medication regime, and encourage to increase activity as tolerated. 5. Polycythemia: Oncology Following.  6. Depression with anxiety : Continue Effexor   20 minutes of face to face patient care time was spent during this visit. All questions were encouraged and answered.

## 2015-05-29 ENCOUNTER — Other Ambulatory Visit: Payer: BLUE CROSS/BLUE SHIELD

## 2015-05-29 ENCOUNTER — Other Ambulatory Visit: Payer: Self-pay

## 2015-05-29 ENCOUNTER — Ambulatory Visit (INDEPENDENT_AMBULATORY_CARE_PROVIDER_SITE_OTHER): Payer: BLUE CROSS/BLUE SHIELD | Admitting: Psychology

## 2015-05-29 DIAGNOSIS — F640 Transsexualism: Secondary | ICD-10-CM

## 2015-05-29 DIAGNOSIS — F4310 Post-traumatic stress disorder, unspecified: Secondary | ICD-10-CM | POA: Diagnosis not present

## 2015-05-29 DIAGNOSIS — Z789 Other specified health status: Secondary | ICD-10-CM

## 2015-05-29 LAB — 6-ACETYLMORPHINE,TOXASSURE ADD
6-ACETYLMORPHINE: NEGATIVE
6-ACETYLMORPHINE: NOT DETECTED ng/mg{creat}

## 2015-05-29 LAB — TOXASSURE SELECT,+ANTIDEPR,UR: PDF: 0

## 2015-05-31 ENCOUNTER — Ambulatory Visit: Payer: BLUE CROSS/BLUE SHIELD | Admitting: Psychology

## 2015-05-31 LAB — TESTOSTERONE,FREE AND TOTAL
TESTOSTERONE: 420 ng/dL (ref 348–1197)
Testosterone, Free: 5.3 pg/mL — ABNORMAL LOW (ref 6.8–21.5)

## 2015-05-31 NOTE — Progress Notes (Signed)
Urine drug screen for this encounter is consistent for prescribed medication.  He is taking Tranxene which explains the oxazepam metabolites and he is prescribed oxycodone which is also present.

## 2015-06-05 ENCOUNTER — Ambulatory Visit (INDEPENDENT_AMBULATORY_CARE_PROVIDER_SITE_OTHER): Payer: BLUE CROSS/BLUE SHIELD | Admitting: Psychology

## 2015-06-05 DIAGNOSIS — F4310 Post-traumatic stress disorder, unspecified: Secondary | ICD-10-CM | POA: Diagnosis not present

## 2015-06-06 ENCOUNTER — Encounter: Payer: BLUE CROSS/BLUE SHIELD | Admitting: Physical Medicine & Rehabilitation

## 2015-06-07 ENCOUNTER — Ambulatory Visit (INDEPENDENT_AMBULATORY_CARE_PROVIDER_SITE_OTHER): Payer: BLUE CROSS/BLUE SHIELD | Admitting: Psychology

## 2015-06-07 DIAGNOSIS — F4310 Post-traumatic stress disorder, unspecified: Secondary | ICD-10-CM | POA: Diagnosis not present

## 2015-06-12 ENCOUNTER — Ambulatory Visit (INDEPENDENT_AMBULATORY_CARE_PROVIDER_SITE_OTHER): Payer: BLUE CROSS/BLUE SHIELD | Admitting: Psychology

## 2015-06-12 ENCOUNTER — Encounter
Payer: BLUE CROSS/BLUE SHIELD | Attending: Physical Medicine & Rehabilitation | Admitting: Physical Medicine & Rehabilitation

## 2015-06-12 DIAGNOSIS — Z7901 Long term (current) use of anticoagulants: Secondary | ICD-10-CM | POA: Insufficient documentation

## 2015-06-12 DIAGNOSIS — S43002A Unspecified subluxation of left shoulder joint, initial encounter: Secondary | ICD-10-CM | POA: Insufficient documentation

## 2015-06-12 DIAGNOSIS — Z79899 Other long term (current) drug therapy: Secondary | ICD-10-CM | POA: Insufficient documentation

## 2015-06-12 DIAGNOSIS — I69320 Aphasia following cerebral infarction: Secondary | ICD-10-CM | POA: Insufficient documentation

## 2015-06-12 DIAGNOSIS — Z87890 Personal history of sex reassignment: Secondary | ICD-10-CM | POA: Insufficient documentation

## 2015-06-12 DIAGNOSIS — L4050 Arthropathic psoriasis, unspecified: Secondary | ICD-10-CM | POA: Insufficient documentation

## 2015-06-12 DIAGNOSIS — R209 Unspecified disturbances of skin sensation: Secondary | ICD-10-CM | POA: Insufficient documentation

## 2015-06-12 DIAGNOSIS — M13862 Other specified arthritis, left knee: Secondary | ICD-10-CM | POA: Insufficient documentation

## 2015-06-12 DIAGNOSIS — F4310 Post-traumatic stress disorder, unspecified: Secondary | ICD-10-CM

## 2015-06-12 DIAGNOSIS — F418 Other specified anxiety disorders: Secondary | ICD-10-CM | POA: Insufficient documentation

## 2015-06-12 DIAGNOSIS — M545 Low back pain: Secondary | ICD-10-CM | POA: Insufficient documentation

## 2015-06-12 DIAGNOSIS — I69398 Other sequelae of cerebral infarction: Secondary | ICD-10-CM | POA: Insufficient documentation

## 2015-06-12 DIAGNOSIS — D6859 Other primary thrombophilia: Secondary | ICD-10-CM | POA: Insufficient documentation

## 2015-06-12 DIAGNOSIS — M7522 Bicipital tendinitis, left shoulder: Secondary | ICD-10-CM | POA: Insufficient documentation

## 2015-06-12 DIAGNOSIS — Z86718 Personal history of other venous thrombosis and embolism: Secondary | ICD-10-CM | POA: Insufficient documentation

## 2015-06-12 DIAGNOSIS — G8929 Other chronic pain: Secondary | ICD-10-CM | POA: Insufficient documentation

## 2015-06-12 DIAGNOSIS — Z9181 History of falling: Secondary | ICD-10-CM | POA: Insufficient documentation

## 2015-06-12 DIAGNOSIS — K900 Celiac disease: Secondary | ICD-10-CM | POA: Insufficient documentation

## 2015-06-12 DIAGNOSIS — D751 Secondary polycythemia: Secondary | ICD-10-CM | POA: Insufficient documentation

## 2015-06-12 DIAGNOSIS — Z79891 Long term (current) use of opiate analgesic: Secondary | ICD-10-CM | POA: Insufficient documentation

## 2015-06-12 DIAGNOSIS — I69351 Hemiplegia and hemiparesis following cerebral infarction affecting right dominant side: Secondary | ICD-10-CM | POA: Insufficient documentation

## 2015-06-14 ENCOUNTER — Ambulatory Visit (INDEPENDENT_AMBULATORY_CARE_PROVIDER_SITE_OTHER): Payer: BLUE CROSS/BLUE SHIELD | Admitting: Neurology

## 2015-06-14 ENCOUNTER — Ambulatory Visit (INDEPENDENT_AMBULATORY_CARE_PROVIDER_SITE_OTHER): Payer: BLUE CROSS/BLUE SHIELD | Admitting: Psychology

## 2015-06-14 DIAGNOSIS — R0681 Apnea, not elsewhere classified: Secondary | ICD-10-CM

## 2015-06-14 DIAGNOSIS — F4310 Post-traumatic stress disorder, unspecified: Secondary | ICD-10-CM | POA: Diagnosis not present

## 2015-06-14 DIAGNOSIS — G4733 Obstructive sleep apnea (adult) (pediatric): Secondary | ICD-10-CM

## 2015-06-15 NOTE — Sleep Study (Signed)
Please see the scanned sleep study interpretation located in the procedure tab within the chart review section.   

## 2015-06-18 ENCOUNTER — Telehealth: Payer: Self-pay | Admitting: Physical Medicine & Rehabilitation

## 2015-06-18 ENCOUNTER — Other Ambulatory Visit: Payer: Self-pay | Admitting: Endocrinology

## 2015-06-18 ENCOUNTER — Encounter: Payer: Self-pay | Admitting: Physical Medicine & Rehabilitation

## 2015-06-18 ENCOUNTER — Encounter: Payer: Self-pay | Admitting: Neurology

## 2015-06-18 ENCOUNTER — Encounter: Payer: Self-pay | Admitting: Endocrinology

## 2015-06-18 ENCOUNTER — Encounter (HOSPITAL_BASED_OUTPATIENT_CLINIC_OR_DEPARTMENT_OTHER): Payer: BLUE CROSS/BLUE SHIELD | Admitting: Physical Medicine & Rehabilitation

## 2015-06-18 VITALS — BP 129/83 | HR 101

## 2015-06-18 DIAGNOSIS — I69398 Other sequelae of cerebral infarction: Secondary | ICD-10-CM | POA: Diagnosis not present

## 2015-06-18 DIAGNOSIS — L4050 Arthropathic psoriasis, unspecified: Secondary | ICD-10-CM | POA: Diagnosis not present

## 2015-06-18 DIAGNOSIS — G8101 Flaccid hemiplegia affecting right dominant side: Secondary | ICD-10-CM

## 2015-06-18 DIAGNOSIS — M7522 Bicipital tendinitis, left shoulder: Secondary | ICD-10-CM

## 2015-06-18 DIAGNOSIS — F418 Other specified anxiety disorders: Secondary | ICD-10-CM | POA: Diagnosis not present

## 2015-06-18 DIAGNOSIS — S43002A Unspecified subluxation of left shoulder joint, initial encounter: Secondary | ICD-10-CM | POA: Diagnosis not present

## 2015-06-18 DIAGNOSIS — D751 Secondary polycythemia: Secondary | ICD-10-CM | POA: Diagnosis not present

## 2015-06-18 DIAGNOSIS — D6859 Other primary thrombophilia: Secondary | ICD-10-CM | POA: Diagnosis not present

## 2015-06-18 DIAGNOSIS — I69351 Hemiplegia and hemiparesis following cerebral infarction affecting right dominant side: Secondary | ICD-10-CM | POA: Diagnosis not present

## 2015-06-18 DIAGNOSIS — G894 Chronic pain syndrome: Secondary | ICD-10-CM

## 2015-06-18 DIAGNOSIS — G8929 Other chronic pain: Secondary | ICD-10-CM | POA: Diagnosis present

## 2015-06-18 DIAGNOSIS — Z96652 Presence of left artificial knee joint: Secondary | ICD-10-CM

## 2015-06-18 DIAGNOSIS — K900 Celiac disease: Secondary | ICD-10-CM | POA: Diagnosis not present

## 2015-06-18 DIAGNOSIS — R209 Unspecified disturbances of skin sensation: Secondary | ICD-10-CM | POA: Diagnosis not present

## 2015-06-18 DIAGNOSIS — M545 Low back pain: Secondary | ICD-10-CM | POA: Diagnosis not present

## 2015-06-18 DIAGNOSIS — Z87890 Personal history of sex reassignment: Secondary | ICD-10-CM | POA: Diagnosis not present

## 2015-06-18 DIAGNOSIS — Z9181 History of falling: Secondary | ICD-10-CM | POA: Diagnosis not present

## 2015-06-18 DIAGNOSIS — I69320 Aphasia following cerebral infarction: Secondary | ICD-10-CM | POA: Diagnosis not present

## 2015-06-18 DIAGNOSIS — Z79891 Long term (current) use of opiate analgesic: Secondary | ICD-10-CM | POA: Diagnosis not present

## 2015-06-18 DIAGNOSIS — Z79899 Other long term (current) drug therapy: Secondary | ICD-10-CM | POA: Diagnosis not present

## 2015-06-18 DIAGNOSIS — Z86718 Personal history of other venous thrombosis and embolism: Secondary | ICD-10-CM | POA: Diagnosis not present

## 2015-06-18 DIAGNOSIS — M13862 Other specified arthritis, left knee: Secondary | ICD-10-CM | POA: Diagnosis not present

## 2015-06-18 DIAGNOSIS — M75101 Unspecified rotator cuff tear or rupture of right shoulder, not specified as traumatic: Secondary | ICD-10-CM

## 2015-06-18 DIAGNOSIS — Z7901 Long term (current) use of anticoagulants: Secondary | ICD-10-CM | POA: Diagnosis not present

## 2015-06-18 MED ORDER — MORPHINE SULFATE ER 60 MG PO CP24: 60.0000 mg | ORAL_CAPSULE | Freq: Two times a day (BID) | ORAL | Status: DC

## 2015-06-18 MED ORDER — OXYCODONE HCL 10 MG PO TABS: 10.0000 mg | ORAL_TABLET | Freq: Three times a day (TID) | ORAL | Status: DC | PRN

## 2015-06-18 NOTE — Telephone Encounter (Signed)
Patient needs a letter written for his disability and also needs someone to call him back about conflict with what Dr. [name] and Dr. [name] are telling him about work.  Dr. Monico Hoar note is stating he can't work and Dr.Swartz told him that he could do sedentary work.

## 2015-06-18 NOTE — Telephone Encounter (Signed)
Please advise 

## 2015-06-18 NOTE — Patient Instructions (Signed)

## 2015-06-18 NOTE — Telephone Encounter (Signed)
Sedentary strictly pertains to his physical capability. It DOES NOT reflect on his cognitive ability

## 2015-06-18 NOTE — Progress Notes (Signed)
Procedure Note  Dx: Left Biceps Tendonitis       Right rotator cuff tendonitis   After informed consent and preparation of the skin with betadine and isopropyl alcohol, I injected 90m (1cc) of celestone and 4cc of 1% lidocaine around the left short head biceps tendon via anterior approach. Additionally, aspiration was performed prior to injection. The patient tolerated well, and no complications were encountered. Afterward the area was cleaned and dressed. Post- injection instructions were provided.   After informed consent and preparation of the skin with betadine and isopropyl alcohol, I injected 672m(1cc) of celestone and 4cc of 1% lidocaine into the right subacromial space via lateral approach. Additionally, aspiration was performed prior to injection. The patient tolerated well, and no complications were encountered. Afterward the area was cleaned and dressed. Post- injection instructions were provided.   Return in about 2 months (around 08/18/2015) for me or NP.

## 2015-06-19 ENCOUNTER — Other Ambulatory Visit: Payer: Self-pay | Admitting: Endocrinology

## 2015-06-19 ENCOUNTER — Encounter: Payer: Self-pay | Admitting: Neurology

## 2015-06-19 ENCOUNTER — Telehealth: Payer: Self-pay | Admitting: Neurology

## 2015-06-19 ENCOUNTER — Ambulatory Visit (INDEPENDENT_AMBULATORY_CARE_PROVIDER_SITE_OTHER): Payer: BLUE CROSS/BLUE SHIELD | Admitting: Psychology

## 2015-06-19 ENCOUNTER — Encounter: Payer: Self-pay | Admitting: Physical Medicine & Rehabilitation

## 2015-06-19 DIAGNOSIS — F4310 Post-traumatic stress disorder, unspecified: Secondary | ICD-10-CM | POA: Diagnosis not present

## 2015-06-19 DIAGNOSIS — Z789 Other specified health status: Secondary | ICD-10-CM

## 2015-06-19 DIAGNOSIS — G4733 Obstructive sleep apnea (adult) (pediatric): Secondary | ICD-10-CM

## 2015-06-19 DIAGNOSIS — F640 Transsexualism: Secondary | ICD-10-CM

## 2015-06-19 DIAGNOSIS — G4731 Primary central sleep apnea: Secondary | ICD-10-CM

## 2015-06-19 NOTE — Telephone Encounter (Signed)
Pt called in about his sleep study results that are posted on MyChart. He says that what has been posted is not him. Please call and advise [phone]

## 2015-06-19 NOTE — Telephone Encounter (Signed)
LMTC./fim 

## 2015-06-19 NOTE — Progress Notes (Unsigned)
[facility] note dated 06/18/15 7:01 pm Caller states he is running low on his testosterone needs a refill please

## 2015-06-19 NOTE — Telephone Encounter (Signed)
Patient returned Faith's call, please call [phone].

## 2015-06-19 NOTE — Telephone Encounter (Signed)
BIPAP orders in EPIC/fim

## 2015-06-20 ENCOUNTER — Ambulatory Visit: Payer: BLUE CROSS/BLUE SHIELD | Admitting: Rehabilitative and Restorative Service Providers"

## 2015-06-20 ENCOUNTER — Encounter: Payer: Self-pay | Admitting: Neurology

## 2015-06-20 NOTE — Telephone Encounter (Signed)
LMTC./fim 

## 2015-06-20 NOTE — Telephone Encounter (Signed)
-----   Message from Britt Bottom, MD sent at 06/18/2015  3:23 PM EDT ----- We will order ASV mode IPAP 12, EPAP 7, PS minimal 4, PS maximum 15

## 2015-06-20 NOTE — Telephone Encounter (Signed)
New email from patient:  Hi Jesse Bowers,  I'm including my chart requests to Dr. [name]. But its so hard to do it with a 1000 characters or less.  Two things:  1) To complicate things with letters: I got a letter from [employer] this past weekend. [employer] (is from previous employer, when i had a life and a career) they are requesting a letter about every 34moto prove my continued disablity or honestly with any new claims adjuster I get and of course I just got a new guy and one would think since I've been applying for disability since 203-14-14that has passed the 1 year requirement but whatever, government and big companies so they want these letters every 633monthuntil SSDI comes through, which I hope is soon. They want three letters from these providers which I've already been in touch with Neuro and [facility] for letters but they need my main PM&R doc and all letters need to be consistent with the fact that I am permanently disabled & still unable to work.Thats all it really needs to say, I am permanently disabled and unable to work. Of course for PM&R, it may proof helpful with Dr. ZeMonico Hoartudy and end findings [name]'s findings p7 state "taken together, combined effect of physical, cognitive & psychiatric symptomology would, IMO, render him incapable of sustaining any type of competitive employment for an indefinite period of time. He isn't seen to be a good candidate for vocational rehab@ this time either". I need a simple letter from you before 07/05/15 or I will not receive any more benefits. You can simply put in myOsager email [employer]: Case Mgr JoMartiniqueorsey Email [email or web address] or call [phone] #[id number] just a simple letter into the [facility] and I can print it off and take care of it myself may be easier. Whatever is easiest because They are requesting info by 5/4. So it doesn't have to be that detailed. I do need the letter soon because they will cut off  my benefits in May. I just received the letter on Friday. Again, All it really needs to say is who you are, why you're seeing me and that I, CoEyoel Throgmortons still deemed permanently disabled and still unable to work. You can add the Finding from [employer] needed and whatever else but I need this asap. Last time, I think you just put on MyChart, I printed and sent it to them via Email and Fax once I obtained that number. And then you don't have to do anything else and I can take care of the busy work for you, I'm so sorry this crap keeps happening. I really think once he finishes that big ole packet for [employer], requested by SSDI, then everything will be cool. Then once my [employer] information packet from [place] is completed, I will be sending that information along to [employer]), [employer] and [employer]. [employer] will then get more information that they may need to prove worthiness of my continued permanent disability and inability to work. but I need a simple letter from you before 07/05/15 or I will not receive any more benefits. But [name] did find my findings suggestive of a left sided stroke and continued TIA's and symptoms of those I am still having at times.    2) And then, I hate to be a pest but I gave it in November and he has done a ton of work on getting things accomplished  for me to get it completed more accurately and comprehensively for the paperwork. So mainly he can quit doing all this crap. I'm just as tired as he is about this stupid paperwork to proof disability when I keep going downhill. I was with a cane, then a walker and now with a wheelchair that I use almost all the time and we are in the process of looking a handicapped accessible home to purchase. So if he can finish the [employer] attorney group and I can have that given to me, then I can give it the necessary people requesting and not having you to worry about it. But I do need it asap. I saw him Monday and he spoke of X-rays for my knee and  I'm wondering if he can do that for my LKR. 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 Ugh. But it would be great to know if the replacement is stable at this time and if I just need to get all puffy with my current ortho doc about another replacement as its been a year on 5/2 and it has not improved and actually made my life quality of life much worse to the point of using a wheelchair due to the pain and lack of stamina due to the left knee pain and it continuing to give out along with my right side just being lest strong anyway and having to support the left and its hurting worse because its trying to do more for the left but its getting worse and it needs replacement worse than left but I can't do it until left is completed. So I guess there were three things. [employer] letter asap like before the end of the month, [employer] information hopefully by mid May or by the end if possible and knee X-rays to see how things are going in that left knee paw.    Thanks so much. and sorry for being a complete PITA for you. You've always been so helpful to me and getting stuff done. 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  I can't imagine working with me. Nurses make the worst patients.   Jesse Bowers

## 2015-06-20 NOTE — Telephone Encounter (Addendum)
Pt called wanting to know if the mychart report was corrected. There are things listed that are not correct. He would like it corrected. Please call the pt to go over what he says is not right. [phone]

## 2015-06-20 NOTE — Telephone Encounter (Signed)
BIPAP orders have been faxed to [facility], and pt. has seen results thru MyChart/fim

## 2015-06-21 ENCOUNTER — Ambulatory Visit (INDEPENDENT_AMBULATORY_CARE_PROVIDER_SITE_OTHER): Payer: BLUE CROSS/BLUE SHIELD | Admitting: Psychology

## 2015-06-21 ENCOUNTER — Other Ambulatory Visit: Payer: Self-pay | Admitting: *Deleted

## 2015-06-21 DIAGNOSIS — G4731 Primary central sleep apnea: Secondary | ICD-10-CM

## 2015-06-21 DIAGNOSIS — F4310 Post-traumatic stress disorder, unspecified: Secondary | ICD-10-CM

## 2015-06-21 DIAGNOSIS — G4733 Obstructive sleep apnea (adult) (pediatric): Secondary | ICD-10-CM

## 2015-06-21 NOTE — Telephone Encounter (Signed)
Response sent thru mychart/fim

## 2015-06-22 ENCOUNTER — Encounter: Payer: Self-pay | Admitting: Physical Medicine & Rehabilitation

## 2015-06-22 ENCOUNTER — Encounter: Payer: Self-pay | Admitting: Psychology

## 2015-06-25 ENCOUNTER — Encounter: Payer: Self-pay | Admitting: Neurology

## 2015-06-26 ENCOUNTER — Ambulatory Visit: Payer: BLUE CROSS/BLUE SHIELD | Admitting: Psychology

## 2015-06-26 ENCOUNTER — Encounter: Payer: Self-pay | Admitting: Psychology

## 2015-06-26 NOTE — Telephone Encounter (Signed)
[facility] ___________________________________________________________________________ [address]                                                                           Telephone [phone] [address]                                                                                                 Fax [phone] [place], [place] [address]   06/26/15   To whom it may concern:   I saw [name]  (DOB: 2065-11-25) on 12/20/14, 12/26/14 & 01/05/15 for a comprehensive neuropsychological evaluation. In my opinion the combined effects of his physical, cognitive and psychiatric symptomology would render him incapable of sustaining any type of competitive employment for an indefinite period of time.     Sincerely,   Antionette Poles, Ph.D

## 2015-06-28 ENCOUNTER — Ambulatory Visit (INDEPENDENT_AMBULATORY_CARE_PROVIDER_SITE_OTHER): Payer: BLUE CROSS/BLUE SHIELD | Admitting: Psychology

## 2015-06-29 ENCOUNTER — Other Ambulatory Visit: Payer: BLUE CROSS/BLUE SHIELD

## 2015-06-29 DIAGNOSIS — Z789 Other specified health status: Secondary | ICD-10-CM

## 2015-06-29 DIAGNOSIS — F640 Transsexualism: Secondary | ICD-10-CM

## 2015-07-01 LAB — TESTOSTERONE,FREE AND TOTAL
TESTOSTERONE FREE: 4.4 pg/mL — AB (ref 6.8–21.5)
Testosterone: 408 ng/dL (ref 348–1197)

## 2015-07-03 ENCOUNTER — Ambulatory Visit (INDEPENDENT_AMBULATORY_CARE_PROVIDER_SITE_OTHER): Payer: BLUE CROSS/BLUE SHIELD | Admitting: Psychology

## 2015-07-03 DIAGNOSIS — F4310 Post-traumatic stress disorder, unspecified: Secondary | ICD-10-CM

## 2015-07-04 ENCOUNTER — Encounter: Payer: Self-pay | Admitting: Physical Medicine & Rehabilitation

## 2015-07-04 ENCOUNTER — Ambulatory Visit: Payer: Self-pay | Admitting: Endocrinology

## 2015-07-05 ENCOUNTER — Ambulatory Visit (INDEPENDENT_AMBULATORY_CARE_PROVIDER_SITE_OTHER): Payer: BLUE CROSS/BLUE SHIELD | Admitting: Psychology

## 2015-07-05 DIAGNOSIS — F4310 Post-traumatic stress disorder, unspecified: Secondary | ICD-10-CM

## 2015-07-10 ENCOUNTER — Ambulatory Visit (INDEPENDENT_AMBULATORY_CARE_PROVIDER_SITE_OTHER): Payer: BLUE CROSS/BLUE SHIELD | Admitting: Psychology

## 2015-07-10 DIAGNOSIS — F4323 Adjustment disorder with mixed anxiety and depressed mood: Secondary | ICD-10-CM

## 2015-07-17 ENCOUNTER — Ambulatory Visit (INDEPENDENT_AMBULATORY_CARE_PROVIDER_SITE_OTHER): Payer: BLUE CROSS/BLUE SHIELD | Admitting: Psychology

## 2015-07-17 DIAGNOSIS — F4310 Post-traumatic stress disorder, unspecified: Secondary | ICD-10-CM | POA: Diagnosis not present

## 2015-07-19 ENCOUNTER — Ambulatory Visit (INDEPENDENT_AMBULATORY_CARE_PROVIDER_SITE_OTHER): Payer: BLUE CROSS/BLUE SHIELD | Admitting: Psychology

## 2015-07-19 ENCOUNTER — Telehealth: Payer: Self-pay | Admitting: Neurology

## 2015-07-19 ENCOUNTER — Telehealth: Payer: Self-pay | Admitting: *Deleted

## 2015-07-19 DIAGNOSIS — F4310 Post-traumatic stress disorder, unspecified: Secondary | ICD-10-CM | POA: Diagnosis not present

## 2015-07-19 DIAGNOSIS — F4323 Adjustment disorder with mixed anxiety and depressed mood: Secondary | ICD-10-CM

## 2015-07-19 NOTE — Telephone Encounter (Signed)
Faith, Mr. Mier says that [facility] will not have a 5/20 Mask in until indefinitely.  He is asking if we have a mask that he can use until then.  He is requesting that he get a phone call today at [phone].

## 2015-07-19 NOTE — Telephone Encounter (Signed)
------------------------------------------------------------   CLAUDE WALDMAN               CID [id number]  Patient SAME                 Pt's Dr [name]        Area Code 518 Phone# [phone] * DOB [phone]     RE SLEEP STUDY MASK/NEEDS TO SPEAK WITH FAITH                                                             Disp:Y/N N If Y = C/B If No Response In 71mnutes ============================================================

## 2015-07-19 NOTE — Telephone Encounter (Signed)
Pt called said he found the CPAP mask he wanted on line. He is wanting to know what size to get- Resmed Airfit F20 . Please call for clarification

## 2015-07-19 NOTE — Telephone Encounter (Signed)
I have spoken with Jesse Bowers and per Shirlean Mylar in the sleep lab, have advised that this particular mask is new to the market and currently on back order.  He verbalized understanding of same/fim

## 2015-07-23 ENCOUNTER — Encounter: Payer: Self-pay | Admitting: Hematology

## 2015-07-23 ENCOUNTER — Other Ambulatory Visit: Payer: Self-pay

## 2015-07-23 ENCOUNTER — Telehealth: Payer: Self-pay | Admitting: Hematology

## 2015-07-23 NOTE — Progress Notes (Signed)
This encounter was created in error - please disregard.

## 2015-07-23 NOTE — Telephone Encounter (Signed)
spoke w/ pt resched 5/22 appt for 5/24

## 2015-07-24 ENCOUNTER — Telehealth: Payer: Self-pay | Admitting: *Deleted

## 2015-07-24 ENCOUNTER — Telehealth: Payer: Self-pay | Admitting: Hematology

## 2015-07-24 ENCOUNTER — Encounter: Payer: Self-pay | Admitting: Hematology

## 2015-07-24 ENCOUNTER — Telehealth: Payer: Self-pay

## 2015-07-24 ENCOUNTER — Ambulatory Visit: Payer: BLUE CROSS/BLUE SHIELD | Admitting: Psychology

## 2015-07-24 ENCOUNTER — Telehealth: Payer: Self-pay | Admitting: Neurology

## 2015-07-24 NOTE — Telephone Encounter (Signed)
pt called to resched 5/24 apt to 6/2

## 2015-07-24 NOTE — Telephone Encounter (Signed)
Size medium

## 2015-07-24 NOTE — Telephone Encounter (Signed)
Called pt at home and left message on voice mail re:  Per Dr. Burr Medico, pt needs to HOLD  Xarelto  2  Days  Before  Joint  Injection.   Asked pt to return nurse call to verily that pt received nurse message.

## 2015-07-24 NOTE — Telephone Encounter (Signed)
Patient who need mask sizing should go to [employer] the Freight forwarder.

## 2015-07-24 NOTE — Telephone Encounter (Signed)
LMOM (identified vm) that mask used in sleep study was a size medium/fim

## 2015-07-25 ENCOUNTER — Ambulatory Visit: Payer: Self-pay | Admitting: Hematology

## 2015-07-25 ENCOUNTER — Other Ambulatory Visit: Payer: Self-pay

## 2015-07-26 ENCOUNTER — Ambulatory Visit: Payer: BLUE CROSS/BLUE SHIELD | Admitting: Psychology

## 2015-07-31 ENCOUNTER — Ambulatory Visit: Payer: BLUE CROSS/BLUE SHIELD | Admitting: Psychology

## 2015-08-02 ENCOUNTER — Ambulatory Visit: Payer: BLUE CROSS/BLUE SHIELD | Admitting: Psychology

## 2015-08-02 ENCOUNTER — Other Ambulatory Visit (HOSPITAL_COMMUNITY): Payer: Self-pay | Admitting: Orthopedic Surgery

## 2015-08-02 DIAGNOSIS — Z96652 Presence of left artificial knee joint: Secondary | ICD-10-CM

## 2015-08-02 DIAGNOSIS — F4310 Post-traumatic stress disorder, unspecified: Secondary | ICD-10-CM | POA: Diagnosis not present

## 2015-08-03 ENCOUNTER — Encounter: Payer: Self-pay | Admitting: Hematology

## 2015-08-03 ENCOUNTER — Other Ambulatory Visit: Payer: Self-pay

## 2015-08-03 NOTE — Progress Notes (Signed)
Pt cancelled the appointment  This encounter was created in error - please disregard.

## 2015-08-08 ENCOUNTER — Ambulatory Visit: Payer: BLUE CROSS/BLUE SHIELD | Admitting: Neurology

## 2015-08-09 ENCOUNTER — Ambulatory Visit: Payer: BLUE CROSS/BLUE SHIELD | Admitting: Psychology

## 2015-08-13 ENCOUNTER — Ambulatory Visit: Payer: BLUE CROSS/BLUE SHIELD | Admitting: Psychology

## 2015-08-13 ENCOUNTER — Telehealth: Payer: Self-pay | Admitting: Hematology

## 2015-08-13 ENCOUNTER — Encounter (HOSPITAL_COMMUNITY): Admission: RE | Admit: 2015-08-13 | Payer: BLUE CROSS/BLUE SHIELD | Source: Ambulatory Visit

## 2015-08-13 ENCOUNTER — Ambulatory Visit (HOSPITAL_COMMUNITY)
Admission: RE | Admit: 2015-08-13 | Discharge: 2015-08-13 | Disposition: A | Discharge status: Home / Self Care | Payer: BLUE CROSS/BLUE SHIELD | Source: Ambulatory Visit | Attending: Orthopedic Surgery | Admitting: Orthopedic Surgery

## 2015-08-13 DIAGNOSIS — R938 Abnormal findings on diagnostic imaging of other specified body structures: Secondary | ICD-10-CM | POA: Insufficient documentation

## 2015-08-13 DIAGNOSIS — Z96652 Presence of left artificial knee joint: Secondary | ICD-10-CM | POA: Diagnosis present

## 2015-08-13 MED ORDER — TECHNETIUM TC 99M MEDRONATE IV KIT
25.2000 | PACK | Freq: Once | INTRAVENOUS | Status: AC | PRN
  Administered 2015-08-13: 25.2 via INTRAVENOUS

## 2015-08-13 NOTE — Telephone Encounter (Signed)
Pt came in to rescehd missed apt, gave pt calender

## 2015-08-14 ENCOUNTER — Telehealth: Payer: Self-pay | Admitting: Hematology

## 2015-08-14 ENCOUNTER — Other Ambulatory Visit (HOSPITAL_BASED_OUTPATIENT_CLINIC_OR_DEPARTMENT_OTHER): Payer: BLUE CROSS/BLUE SHIELD

## 2015-08-14 ENCOUNTER — Encounter: Payer: Self-pay | Admitting: Neurology

## 2015-08-14 ENCOUNTER — Encounter: Payer: Self-pay | Admitting: Hematology

## 2015-08-14 ENCOUNTER — Ambulatory Visit (HOSPITAL_BASED_OUTPATIENT_CLINIC_OR_DEPARTMENT_OTHER): Payer: BLUE CROSS/BLUE SHIELD | Admitting: Hematology

## 2015-08-14 VITALS — BP 111/82 | HR 105 | Temp 98.7°F | Resp 18 | Ht 68.0 in | Wt 226.9 lb

## 2015-08-14 DIAGNOSIS — Z7901 Long term (current) use of anticoagulants: Secondary | ICD-10-CM

## 2015-08-14 DIAGNOSIS — D6859 Other primary thrombophilia: Secondary | ICD-10-CM

## 2015-08-14 DIAGNOSIS — Z86718 Personal history of other venous thrombosis and embolism: Secondary | ICD-10-CM | POA: Diagnosis not present

## 2015-08-14 DIAGNOSIS — D751 Secondary polycythemia: Secondary | ICD-10-CM

## 2015-08-14 DIAGNOSIS — Z789 Other specified health status: Secondary | ICD-10-CM

## 2015-08-14 DIAGNOSIS — F640 Transsexualism: Secondary | ICD-10-CM

## 2015-08-14 LAB — COMPREHENSIVE METABOLIC PANEL
ALBUMIN: 3.7 g/dL (ref 3.5–5.0)
ALK PHOS: 422 U/L — AB (ref 40–150)
ALT: 153 U/L — ABNORMAL HIGH (ref 0–55)
AST: 104 U/L — AB (ref 5–34)
Anion Gap: 7 mEq/L (ref 3–11)
BILIRUBIN TOTAL: 0.51 mg/dL (ref 0.20–1.20)
BUN: 12.6 mg/dL (ref 7.0–26.0)
CO2: 27 meq/L (ref 22–29)
Calcium: 8.9 mg/dL (ref 8.4–10.4)
Chloride: 104 mEq/L (ref 98–109)
Creatinine: 1.1 mg/dL (ref 0.7–1.3)
EGFR: 75 mL/min/{1.73_m2} — ABNORMAL LOW (ref >90)
GLUCOSE: 140 mg/dL (ref 70–140)
POTASSIUM: 3.8 meq/L (ref 3.5–5.1)
SODIUM: 138 meq/L (ref 136–145)
TOTAL PROTEIN: 6.2 g/dL — AB (ref 6.4–8.3)

## 2015-08-14 LAB — CBC WITH DIFFERENTIAL/PLATELET
BASO%: 0.1 % (ref 0.0–2.0)
Basophils Absolute: 0 10*3/uL (ref 0.0–0.1)
EOS%: 0 % (ref 0.0–7.0)
Eosinophils Absolute: 0 10*3/uL (ref 0.0–0.5)
HCT: 43.6 % (ref 38.4–49.9)
HEMOGLOBIN: 14.2 g/dL (ref 13.0–17.1)
LYMPH%: 31.6 % (ref 14.0–49.0)
MCH: 28.2 pg (ref 27.2–33.4)
MCHC: 32.6 g/dL (ref 32.0–36.0)
MCV: 86.7 fL (ref 79.3–98.0)
MONO#: 0.2 10*3/uL (ref 0.1–0.9)
MONO%: 6 % (ref 0.0–14.0)
NEUT%: 62.3 % (ref 39.0–75.0)
NEUTROS ABS: 1.8 10*3/uL (ref 1.5–6.5)
Platelets: 149 10*3/uL (ref 140–400)
RBC: 5.03 10*6/uL (ref 4.20–5.82)
RDW: 14.9 % — AB (ref 11.0–14.6)
WBC: 2.8 10*3/uL — AB (ref 4.0–10.3)
lymph#: 0.9 10*3/uL (ref 0.9–3.3)

## 2015-08-14 NOTE — Progress Notes (Signed)
[facility] HEMATOLOGY FOLLOW UP NOTE DATE OF VISIT: 08/14/2015   Patient Care Team: Concepcion Elk, MD as PCP - General (Internal Medicine) Bernadene Bell, MD as Consulting Physician (Hematology) Aretta Nip, MD as Referring Physician (Family Medicine)  CHIEF COMPLAINTS Follow up DVT and polycythemia  HISTORY OF INITIAL PRESENTING ILLNESS (from Dr. Renella Cunas note):   Jesse Bowers 50 y.o. 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 Patient is a former Software engineer. Patient had a fractured foot repair in 2007 and caused a right leg DVT. At the time he took Coumadin for 3 months.   In May of this year 2015 patient presented with right-sided weakness and slurring of speech, he felt tired, had some nausea and was having trouble gripping the soap when he was in the shower. He checked himself in the mirror there was no facial droop but based on the symptoms he texted his wife took him to the hospital where he was kept overnight. His symptoms of TIA lasted for more than 24 hours and he had appropriate neuro imaging done. When he was seen by a neurologist later, he was told that he had a left frontal stroke. This happened at a young age of 67 so his primary care physician ordered hypercoagulable panel and that showed a protein C deficiency. This TIA or stroke event took place on 07/27/2013 an MRI brain from that date showed changes of early chronic small vessel ischemic changes. A note from the previous hematologist Dr Laury Axon in [place] indicated that at [facility] they also felt that it was not a stroke. Carotid ultrasound showed minimal atherosclerotic plaques. The hypercoagulable panel was drawn on 08/19/2013 and he was negative for factor V Leiden mutation, prothrombin gene mutation. He had normal levels of antithrombin III and protein S but the protein C level was decreased at 47%(reference range is 70-180%).  A repeat protein C activity done on 09/28/2013 was 58% still low. Lupus anticoagulant was negative. Beta-2 glycoprotein antibodies were negative.  Patient is currently receiving a baby aspirin for last 3 months. The hematologist in [place] Dr. Alain Marion also gave him some Lovenox injections for prophylactic use just before a long distance travel. He encouraged him to avoid inactivity/immobility. Patient was not given an anticoagulant such as warfarin or the new generation drugs like Xarelto or Eliquis.  On 04/28/2013 patient's CBC showed a white count of 6.2 hemoglobin 18.7 hematocrit 53.1 MCV 92 platelet count of 207. Differential was normal. Chemistry panel was normal creatinine 1.23. Patient does have history of elevated liver enzymes in the past and the etiology of that is not clear likely medication related. He had liver biopsy done in 2013 which was reportedly normal. Patient was taking Remicade for over an year and it was helping his skin lesions of psoriasis but not the joint pain. It was switched to Enbrel which he is still taking a subcutaneous shot once a week that is every Sunday.  Patient also have some pain control issues and was seeing a pain specialist in [place]. He is in the process of getting a referral to a pain specialist here. His pain medications include morphine extended release, oxycodone, occasional naproxen and methadone. Patient is also on antidepressants.  Patient also had a clot at PICC line for antibiotics in 2006. Patient had history of psoriasis and psoriatic arthritis being managed by a rheumatologist in [place]. Now he is shifting his care to a local rheumatologist here.  Most of his psoriasis involves the bottom of the feet mostly on the left side, hands, knees and ears.  Patient also had a history of right-sided parotitis documented in May 2015 that was treated with antibiotics. He does have chronic dry mouth because of sleep apnea as well as his antidepressant  therapy.  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 That itself can increase the risk for both arterial as well as venous thromboembolic events. 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 For his depression patient was on Prozac in March 2015 but it was causing decreased sexual libido and changed to Wellbutrin in March 2015. He does have mild constipation with the use of narcotics and takes Colace twice a day.  Patient is also complaining of 50 pound weight loss in the last 3-4 months. He thinks it is unintentional. He has been on immunosuppressant medication such as Remicade and Enbrel and sometimes those can be associated with possibility of non-Hodgkin lymphomas. He also have chronic pain and recently saw an orthopedic physician who recommended MRI of the neck which will be scheduled. He is also seeing a rheumatologist next month, a neurologist and a pain specialist. He does complain of left shoulder pain and left elbow pain. There was some discussion by previous rheumatologist in [place] about switching his biological therapy boost to Stelara or [place] Nail once he is evaluated by a rheumatologist. Patient had already received his flu shot as well as pneumonia shot. He also complains of having significant short-term memory problems and trouble concentrating which could be from his medications or TIA episode.  So patient's active hematologic problems are  #1 Protein C deficiency documented twice. #2 TIA/CVA in May 2015 #3 history of DVT in 2007 Right leg treated with Coumadin x 3 months. It was provoked by foot injury #3 Use of immunosuppressant medications which increase the likelihood of lymphomas. 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  CURRENT THERAPY: Xarelto 20 mg once daily,  phlebotomy if hematocrit above 45%   INTERVAL HISTORY: Jesse Bowers returns for follow-up. He is doing moderately well. He still has significant pain at left knee, and bilateral shoulder, he is able to walk with a walker, but cannot walk for long distance. He came in with a wheelchair today. He has been following his orthopedic surgeon, and had a bone scan done yesterday. He also had multiple steroids injection to his shoulders. He is on weekly low-dose testosterone, managed by his endocrinologist Dr. Arnoldo Lenis, and has been adapted to the low-dose testosterone. No other new complaints. He is tolerating Xarelto very well, no bleeding. 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 He denies any significant chest pain, or orthostatic dyspnea.  MEDICAL HISTORY:   Past Medical History  Diagnosis Date  . psoriatic arthritis   . H/O protein C deficiency   . Hx-TIA (transient ischemic attack)   . Clotting disorder ([place])   . Celiac disease   . H/O parotitis     right   . Polycythemia, secondary   . Transgendered   . Sleep apnea   . Neck pain   . Abnormal weight loss   . Gluten enteropathy   . Syrinx of spinal cord ([place]) 01/06/2014 on MRI    c  spine  . Anxiety     PTSD per patient    SURGICAL HISTORY:  Past Surgical History  Procedure Laterality Date  . Thyroidectomy, partial  2008  . Cholecystectomy    . Ankle arthroscopy with reconstruction Right 2007  . Knee surgery Bilateral 1984    Right ACL, left PCL repair  . Liver biopsy  2013    normal results.  . Hip arthroscopy w/ labral repair Right 05/11/2013    SOCIAL HISTORY:  Social History   Social History  . Marital Status: Married    Spouse Name: N/A  . Number of Children: 2  . 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 Smoking status: Never  Smoker   . Smokeless tobacco: Not on file  . Alcohol Use: Yes     Comment: wine once a month  . Drug Use: No  . Sexual Activity: Yes    Birth Control/ Protection: None     Comment: patient is a transgender on testosterone shots, no biological kids   Other Topics Concern  . 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 Relocated to [place] about 2 months ago from [place], [place]. Patient was in [place] for last 4 years and prior to that in [place]. His wife is working as Scientist, research (physical sciences) for [employer]. Patient is not working and applying for disability. They have 2 kids but no biologic children.     FAMILY HISTORY:  Family History  Problem Relation Age of Onset  . Stroke Paternal Uncle     age 62  . Stroke Maternal Grandfather     44  . Hypertension Mother   . Psoriasis Mother   . Stroke Maternal Grandmother   . Congestive Heart Failure Maternal Grandmother   . Cancer Paternal Grandfather   . Protein C deficiency Sister 57    Miscarriages    ALLERGIES:  is allergic to penicillin g; vancomycin; wheat extract; duloxetine; gabapentin; and sulfa antibiotics.  MEDICATIONS:  Current Outpatient Prescriptions  Medication Sig Dispense Refill  . clorazepate (TRANXENE) 7.5 MG tablet Take one tablet by mouth three times daily (Patient taking differently: Take 7.5 mg by mouth 4 (four) times daily. Take one tablet by mouth three times daily) 90 tablet 0  . cyclobenzaprine (FLEXERIL) 5 MG tablet Take 1 tablet (5 mg total) by mouth at bedtime. 30 tablet 11  . diclofenac sodium (VOLTAREN) 1 % GEL Apply 1 application topically 3 (three) times daily. To feet, hands, knees, elbows (Patient taking differently: Apply 1 application topically as needed. To feet, hands, knees, elbows) 3 Tube 4  . morphine (KADIAN) 60 MG 24 hr capsule Take 1 capsule (60 mg total) by mouth every 12  (twelve) hours. 60 capsule 0  . NONFORMULARY OR COMPOUNDED ITEM Apply 1 application topically 3 (three) times daily. 8% ketamine, 5% amitriptyline, 5% baclofen, 5% gabapentin  60 GM 1 each 5  . Oxycodone HCl 10 MG TABS Take 1 tablet (10 mg total) by mouth every 8 (eight) hours as needed. 90 tablet 0  . polyethylene glycol (MIRALAX / GLYCOLAX) packet Take 17 g by mouth as needed. Reported on 05/10/2015    . prazosin (MINIPRESS) 2 MG capsule Take 6 mg by mouth at bedtime.    [name]  rivaroxaban (XARELTO) 20 MG TABS tablet Take 1 tablet (20 mg total) by mouth daily with supper. 30 tablet 5  . Secukinumab 150 MG/ML SOAJ Inject into the skin. Inject every 28 days.    [name] testosterone cypionate (DEPO-TESTOSTERONE) 200 MG/ML injection Inject 0.25 mLs (50 mg total) into the muscle once a week. 10 mL 0  . venlafaxine (EFFEXOR) 75 MG tablet Take 225 mg by mouth every morning.      No current facility-administered medications for this visit.    REVIEW OF SYSTEMS:   Constitutional: Denies fevers, chills or abnormal night sweats Eyes: Denies blurriness of vision, double vision or watery eyes Ears, nose, mouth, throat, and face: Denies mucositis or sore throat, +Dry mouth Respiratory: Denies cough, dyspnea or wheezes Cardiovascular: Denies palpitation, chest discomfort or lower extremity swelling Gastrointestinal:  Denies nausea, heartburn, + change in bowel habits that is Constipation Skin: Hx of psoriasis and arthropathy from it.  Lymphatics: Denies new lymphadenopathy or easy bruising, hx of right parotitis twice. Neurological:Denies numbness, tingling or new weaknesses, recent TIA March 2015 Behavioral/Psych: Mood is stable, no new changes, +depression and anxiety, +memory problems All other systems were reviewed with the patient and are negative.  PHYSICAL EXAMINATION: ECOG PERFORMANCE STATUS: 3  Filed Vitals:   08/14/15 1136  BP: 111/82  Pulse: 105  Temp: 98.7 F (37.1 C)  Resp: 18    GENERAL:alert, no distress and comfortable SKIN: skin color, texture, turgor are normal, no rashes or significant lesions EYES: normal, conjunctiva are pink and non-injected, sclera clear except small bleeding in the inner of right sclera  OROPHARYNX:no exudate, no erythema and lips, buccal mucosa, and tongue normal  NECK: supple, thyroid normal size, non-tender, without nodularity LYMPH:  no palpable lymphadenopathy in the cervical, axillary or inguinal LUNGS: clear to auscultation and percussion with normal breathing effort HEART: regular rate & rhythm and no murmurs and no lower extremity edema ABDOMEN:abdomen soft, non-tender and normal bowel sounds Musculoskeletal:no cyanosis of digits and no clubbing  PSYCH: alert & oriented x 3 NEURO: no focal motor/sensory deficits EXT: The surgical scar at the left knee has healed well, the range of motion of left knee has improved. No pitting edema.  LABORATORY DATA:  I have reviewed the data as listed CBC Latest Ref Rng 08/14/2015 04/30/2015 03/26/2015  WBC 4.0 - 10.3 10e3/uL 2.8(L) 3.9(L) 5.1  Hemoglobin 13.0 - 17.1 g/dL 14.2 15.1 13.9  Hematocrit 38.4 - 49.9 % 43.6 44.7 42.7  Platelets 140 - 400 10e3/uL 149 180.0 191   CMP Latest Ref Rng 08/14/2015 03/26/2015 01/29/2015  Glucose 70 - 140 mg/dl 140 108 97  BUN 7.0 - 26.0 mg/dL 12.6 16.5 17.1  Creatinine 0.7 - 1.3 mg/dL 1.1 1.3 1.3  Sodium 136 - 145 mEq/L 138 140 138  Potassium 3.5 - 5.1 mEq/L 3.8 4.1 4.8  CO2 22 - 29 mEq/L _0 Calcium 8.4 - 10.4 mg/dL 8.9 9.1 9.2  Total Protein 6.4 - 8.3 g/dL 6.2(L) 6.8 6.9  Total Bilirubin 0.20 - 1.20 mg/dL 0.51 0.38 0.39  Alkaline Phos 40 - 150 U/L 422(H) 229(H) 250(H)  AST 5 - 34 U/L 104(H) 21 39(H)  ALT 0 - 55 U/L 153(H) 29 28     Pathology report Diagnosis Bone Marrow, Aspirate,Biopsy, and Clot, right iliac - SLIGHTLY HYPERCELLULAR BONE MARROW FOR AGE WITH TRILINEAGE HEMATOPOIESIS. - SEE COMMENT. PERIPHERAL BLOOD: - NO SIGNIFICANT  MORPHOLOGIC ABNORMALITIES. Diagnosis Note The bone marrow is slightly hypercellular with trilineage hematopoiesis and essentially orderly  and progressive maturation of all cell types. Significant dyspoiesis is not present. Iron stores are markedly decreased to absent and hence correlation with serum iron studies is recommended. There is no morphologic evidence of a myeloproliferative process in this material. Correlation with cytogenetic studies is recommended. (BNS:ecj 25-Apr-2014)   RADIOGRAPHIC STUDIES: No results found.  ASSESSMENT & PLAN:   1. 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 He has slightly hypercellular bone marrow with trilineage hematopoiesis.  No morphology evidence of myeloproliferative process. Cytogenetics was normal (no Y chromosome).  JAK2 mutation was negative. -He had Hemoglobin 18.7 hematocrit 53.1 in 04/2013. His able level is normal. His presentation is consistent with secondary polycythemia, likely secondary to testosterone injections. 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 Loanne Drilling to adjust his testosterone injection dose.  -We discussed that secondary polycythemia is usually benign, risk of thrombosis is relatively low than PV. However he did have history of stroke, and he feels fatigued when his hemoglobin is high. He agrees to continue phlebotomy as needed. -He is only on low dose of the testosterone, his hematocrit has being below 45 most of time lately. We'll continue monitoring.   2. History of DVT and stroke, Protein C deficiency  - continue Xarelto indefinitely given his underlying protein C deficiency and history of DVT and stroke. His previous DVT was provoked by foot injury. -Continue Xarelto indefinitely. Okay to hold for 1-2 days before joint injection by his rheumatologist.  3.  History of CVA  4. 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 I prefer him to maintain low normal level of testosterone. He agrees. He will follow-up with Dr. Loanne Drilling   5. RA -He is on Secukinumab.  6. Episodic tachycardia, ? SVT -I'll refer him to a EP cardiologist  7. Elevated alkaline phosphatase and transaminitis -He has chronic mild transaminitis, slightly worse today, his elevated alkaline phosphatase is also higher than before -His primary care physician has noticed the above change and will follow up on that.   Recommendations -continue Xarelto -CBC every 3 months and phlebotomy if hematocrit above 45%, no phlebotomy today -I'll see him in back in 6 months -Follow-up of his primary care physician  All questions were answered. The patient knows to call the clinic with any problems, questions or concerns.  I spent 10 minutes counseling the patient face to face. The total time spent in the appointment was 15 minutes and more than 50% was on counseling.   [name]  08/14/2015

## 2015-08-14 NOTE — Telephone Encounter (Signed)
Gave and printed appt sched and avs for pt for Sept and DEC

## 2015-08-15 ENCOUNTER — Other Ambulatory Visit: Payer: Self-pay | Admitting: *Deleted

## 2015-08-15 DIAGNOSIS — R682 Dry mouth, unspecified: Secondary | ICD-10-CM

## 2015-08-15 DIAGNOSIS — G4733 Obstructive sleep apnea (adult) (pediatric): Secondary | ICD-10-CM

## 2015-08-16 ENCOUNTER — Encounter: Payer: Self-pay | Admitting: Hematology

## 2015-08-16 ENCOUNTER — Ambulatory Visit: Payer: BLUE CROSS/BLUE SHIELD | Admitting: Psychology

## 2015-08-17 ENCOUNTER — Encounter: Payer: Self-pay | Admitting: Registered Nurse

## 2015-08-17 ENCOUNTER — Encounter: Payer: BLUE CROSS/BLUE SHIELD | Attending: Physical Medicine & Rehabilitation | Admitting: Registered Nurse

## 2015-08-17 ENCOUNTER — Encounter: Payer: Self-pay | Admitting: Neurology

## 2015-08-17 VITALS — BP 124/78 | HR 107

## 2015-08-17 DIAGNOSIS — G8929 Other chronic pain: Secondary | ICD-10-CM | POA: Insufficient documentation

## 2015-08-17 DIAGNOSIS — Z87890 Personal history of sex reassignment: Secondary | ICD-10-CM | POA: Insufficient documentation

## 2015-08-17 DIAGNOSIS — Z79899 Other long term (current) drug therapy: Secondary | ICD-10-CM | POA: Insufficient documentation

## 2015-08-17 DIAGNOSIS — Z9181 History of falling: Secondary | ICD-10-CM | POA: Diagnosis not present

## 2015-08-17 DIAGNOSIS — I69398 Other sequelae of cerebral infarction: Secondary | ICD-10-CM | POA: Insufficient documentation

## 2015-08-17 DIAGNOSIS — Z7901 Long term (current) use of anticoagulants: Secondary | ICD-10-CM | POA: Diagnosis not present

## 2015-08-17 DIAGNOSIS — Z79891 Long term (current) use of opiate analgesic: Secondary | ICD-10-CM | POA: Insufficient documentation

## 2015-08-17 DIAGNOSIS — Z86718 Personal history of other venous thrombosis and embolism: Secondary | ICD-10-CM | POA: Insufficient documentation

## 2015-08-17 DIAGNOSIS — L4050 Arthropathic psoriasis, unspecified: Secondary | ICD-10-CM | POA: Insufficient documentation

## 2015-08-17 DIAGNOSIS — F418 Other specified anxiety disorders: Secondary | ICD-10-CM | POA: Diagnosis not present

## 2015-08-17 DIAGNOSIS — D751 Secondary polycythemia: Secondary | ICD-10-CM | POA: Diagnosis not present

## 2015-08-17 DIAGNOSIS — S43002A Unspecified subluxation of left shoulder joint, initial encounter: Secondary | ICD-10-CM | POA: Insufficient documentation

## 2015-08-17 DIAGNOSIS — M545 Low back pain, unspecified: Secondary | ICD-10-CM

## 2015-08-17 DIAGNOSIS — I69351 Hemiplegia and hemiparesis following cerebral infarction affecting right dominant side: Secondary | ICD-10-CM | POA: Diagnosis not present

## 2015-08-17 DIAGNOSIS — M7712 Lateral epicondylitis, left elbow: Secondary | ICD-10-CM

## 2015-08-17 DIAGNOSIS — Z5181 Encounter for therapeutic drug level monitoring: Secondary | ICD-10-CM

## 2015-08-17 DIAGNOSIS — M7522 Bicipital tendinitis, left shoulder: Secondary | ICD-10-CM | POA: Diagnosis not present

## 2015-08-17 DIAGNOSIS — M13862 Other specified arthritis, left knee: Secondary | ICD-10-CM | POA: Insufficient documentation

## 2015-08-17 DIAGNOSIS — G894 Chronic pain syndrome: Secondary | ICD-10-CM

## 2015-08-17 DIAGNOSIS — D6859 Other primary thrombophilia: Secondary | ICD-10-CM | POA: Diagnosis not present

## 2015-08-17 DIAGNOSIS — Z96652 Presence of left artificial knee joint: Secondary | ICD-10-CM

## 2015-08-17 DIAGNOSIS — I69320 Aphasia following cerebral infarction: Secondary | ICD-10-CM | POA: Insufficient documentation

## 2015-08-17 DIAGNOSIS — K900 Celiac disease: Secondary | ICD-10-CM | POA: Diagnosis not present

## 2015-08-17 DIAGNOSIS — G8101 Flaccid hemiplegia affecting right dominant side: Secondary | ICD-10-CM

## 2015-08-17 DIAGNOSIS — M25562 Pain in left knee: Secondary | ICD-10-CM

## 2015-08-17 DIAGNOSIS — R209 Unspecified disturbances of skin sensation: Secondary | ICD-10-CM | POA: Insufficient documentation

## 2015-08-17 DIAGNOSIS — M25561 Pain in right knee: Secondary | ICD-10-CM

## 2015-08-17 MED ORDER — OXYCODONE HCL 10 MG PO TABS: 10.0000 mg | ORAL_TABLET | Freq: Three times a day (TID) | ORAL | Status: DC | PRN

## 2015-08-17 MED ORDER — MORPHINE SULFATE ER 60 MG PO CP24: 60.0000 mg | ORAL_CAPSULE | Freq: Two times a day (BID) | ORAL | Status: DC

## 2015-08-17 NOTE — Progress Notes (Signed)
Subjective:    Patient ID: Jesse Bowers, male    DOB: 1965-05-27, 50 y.o.   MRN: [id number]  HPI: [name] is a 50 year old male who returns for follow-up for chronic pain and medication refill. He states his pain is located in his left elbow, lower-back, left knee, and bilateral feet. Also states his pain has increased in intensity since his Rheumatologist ( Dr. Gerilyn Nestle) placed his Secukinumab on hold. He rates his pain 8. His current exercise regime is performing Yoga three times a week and walking with his walker short distances. On 06/18/15 he had steroid injection in his left bicep with good relief noted.  Mr. [name] forgot his medications according to [facility] his  Kadian was picked up on 08/08/15 and Oxycodone  on 08/04/15. Instructed to bring his medications to all appointments, he verbalizes understanding.    Pain Inventory Average Pain 8 Pain Right Now 8 My pain is constant, sharp, stabbing and aching  In the last 24 hours, has pain interfered with the following? General activity 9 Relation with others 9 Enjoyment of life 9 What TIME of day is your pain at its worst? night Sleep (in general) NA  Pain is worse with: walking and sitting Pain improves with: nA Relief from Meds: Na  Mobility do you drive?  no use a wheelchair Do you have any goals in this area?  no  Function disabled: date disabled 2015 I need assistance with the following:  feeding, dressing, bathing, meal prep, household duties and shopping Do you have any goals in this area?  no  Neuro/Psych weakness trouble walking confusion anxiety  Prior Studies Any changes since last visit?  yes bone scan Sleep study  Physicians involved in your care Any changes since last visit?  no   Family History  Problem Relation Age of Onset  . Stroke Paternal Uncle     age 39  . Stroke Maternal Grandfather     66  . Hypertension Mother   . Psoriasis Mother   . Stroke Maternal Grandmother   .  Congestive Heart Failure Maternal Grandmother   . Cancer Paternal Grandfather   . Protein C deficiency Sister 41    Miscarriages   Social History   Social History  . Marital Status: Married    Spouse Name: N/A  . Number of Children: 2  . 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 Smoking status: Never Smoker   . Smokeless tobacco: None  . Alcohol Use: Yes     Comment: wine once a month  . Drug Use: No  . Sexual Activity: Yes    Birth Control/ Protection: None     Comment: patient is a transgender on testosterone shots, no biological kids   Other Topics Concern  . 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 Relocated to [place] about 2 months ago from [place], [place]. Patient was in [place] for last 4 years and prior to that in [place]. His wife is working as Scientist, research (physical sciences) for [employer]. Patient is not working and applying for disability. They have 2 kids but no biologic children.    Past Surgical History  Procedure Laterality Date  . Thyroidectomy, partial  2008  . Cholecystectomy    .  Ankle arthroscopy with reconstruction Right 2007  . Knee surgery Bilateral 1984    Right ACL, left PCL repair  . Liver biopsy  2013    normal results.  . Hip arthroscopy w/ labral repair Right 05/11/2013   Past Medical History  Diagnosis Date  . psoriatic arthritis   . H/O protein C deficiency   . Hx-TIA (transient ischemic attack)   . Clotting disorder ([place])   . Celiac disease   . H/O parotitis     right   . Polycythemia, secondary   . Transgendered   . Sleep apnea   . Neck pain   . Abnormal weight loss   . Gluten enteropathy   . Syrinx of spinal cord ([place]) 01/06/2014 on MRI    c spine  . Anxiety     PTSD per patient   BP 124/78 mmHg  Pulse 101   SpO2 98%  Opioid Risk Score:   Fall Risk Score:  `1  Depression screen PHQ 2/9  Depression screen [facility] 2/9 06/18/2015 05/24/2015 06/12/2014  Decreased Interest 2 2 2   Down, Depressed, Hopeless 0 0 0  PHQ - 2 Score 2 2 2   Altered sleeping - - 3  Tired, decreased energy - - 3  Change in appetite - - 0  Feeling bad or failure about yourself  - - 0  Trouble concentrating - - 3  Moving slowly or fidgety/restless - - 0  Suicidal thoughts - - 0  PHQ-9 Score - - 11       Review of Systems  Respiratory: Positive for apnea and shortness of breath.   All other systems reviewed and are negative.      Objective:   Physical Exam  Constitutional: He is oriented to person, place, and time. He appears well-developed and well-nourished.  HENT:  Head: Normocephalic and atraumatic.  Neck: Normal range of motion. Neck supple.  Cardiovascular: Normal rate and regular rhythm.   Pulmonary/Chest: Effort normal and breath sounds normal.  Musculoskeletal:  Normal Muscle Bulk and Muscle Testing Reveals: Upper Extremities: Full ROM and Muscle Strength 5/5 Lumbar Paraspinal Tenderness: L-4- L-5 Lower Extremities: Full ROM and Muscle Strength 5/5 Bilateral Lower Extremities Flexion Produces Pain into Bilateral Patella's Arrived in wheelchair  Neurological: He is alert and oriented to person, place, and time.  Skin: Skin is warm and dry.  Psychiatric: He has a normal mood and affect.  Nursing note and vitals reviewed.         Assessment & Plan:  1. Psoriatic arthritis with pain in multiple areas, most prominently feet, hands, elbows.: Refilled:Kadian 60 mg 24 hr. Capsule, one capsule every 12 hours #60 and Continue Oxycodone 10 mg one tablet every 8 hours as needed #90.  Rheumatology Following. 2. 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 Patello-femoral arthritis  left knee: Continue Voltaren Gel S/P TKR on 07/03/14: Ortho Following 4. Chronic mid- low back pain: Continue current medication regime, and encourage to increase activity as tolerated. 5. Polycythemia: Oncology Following.  6. Depression with anxiety : Continue Effexor   20 minutes of face to face patient care time was spent during this visit. All questions were encouraged and answered.

## 2015-08-20 ENCOUNTER — Ambulatory Visit (INDEPENDENT_AMBULATORY_CARE_PROVIDER_SITE_OTHER): Payer: BLUE CROSS/BLUE SHIELD | Admitting: Psychology

## 2015-08-22 ENCOUNTER — Telehealth: Payer: Self-pay | Admitting: *Deleted

## 2015-08-22 ENCOUNTER — Encounter: Payer: Self-pay | Admitting: Hematology

## 2015-08-22 ENCOUNTER — Ambulatory Visit (HOSPITAL_BASED_OUTPATIENT_CLINIC_OR_DEPARTMENT_OTHER): Payer: BLUE CROSS/BLUE SHIELD | Admitting: Nurse Practitioner

## 2015-08-22 ENCOUNTER — Telehealth: Payer: Self-pay | Admitting: Nurse Practitioner

## 2015-08-22 ENCOUNTER — Emergency Department (HOSPITAL_COMMUNITY)
Admission: EM | Admit: 2015-08-22 | Discharge: 2015-08-23 | Disposition: A | Discharge status: Home / Self Care | Payer: BLUE CROSS/BLUE SHIELD | Attending: Emergency Medicine | Admitting: Emergency Medicine

## 2015-08-22 ENCOUNTER — Encounter (HOSPITAL_COMMUNITY): Payer: Self-pay | Admitting: Emergency Medicine

## 2015-08-22 ENCOUNTER — Emergency Department (HOSPITAL_COMMUNITY): Payer: BLUE CROSS/BLUE SHIELD

## 2015-08-22 ENCOUNTER — Other Ambulatory Visit: Payer: Self-pay | Admitting: *Deleted

## 2015-08-22 ENCOUNTER — Ambulatory Visit (HOSPITAL_BASED_OUTPATIENT_CLINIC_OR_DEPARTMENT_OTHER): Payer: BLUE CROSS/BLUE SHIELD

## 2015-08-22 VITALS — BP 128/76 | HR 107 | Temp 98.4°F | Resp 18 | Ht 68.0 in | Wt 225.8 lb

## 2015-08-22 DIAGNOSIS — W050XXA Fall from non-moving wheelchair, initial encounter: Secondary | ICD-10-CM | POA: Insufficient documentation

## 2015-08-22 DIAGNOSIS — Y939 Activity, unspecified: Secondary | ICD-10-CM | POA: Diagnosis not present

## 2015-08-22 DIAGNOSIS — Z8673 Personal history of transient ischemic attack (TIA), and cerebral infarction without residual deficits: Secondary | ICD-10-CM | POA: Insufficient documentation

## 2015-08-22 DIAGNOSIS — D751 Secondary polycythemia: Secondary | ICD-10-CM

## 2015-08-22 DIAGNOSIS — R350 Frequency of micturition: Secondary | ICD-10-CM | POA: Diagnosis not present

## 2015-08-22 DIAGNOSIS — Z7901 Long term (current) use of anticoagulants: Secondary | ICD-10-CM

## 2015-08-22 DIAGNOSIS — Z792 Long term (current) use of antibiotics: Secondary | ICD-10-CM | POA: Insufficient documentation

## 2015-08-22 DIAGNOSIS — R1011 Right upper quadrant pain: Secondary | ICD-10-CM | POA: Insufficient documentation

## 2015-08-22 DIAGNOSIS — Y999 Unspecified external cause status: Secondary | ICD-10-CM | POA: Diagnosis not present

## 2015-08-22 DIAGNOSIS — Y929 Unspecified place or not applicable: Secondary | ICD-10-CM | POA: Insufficient documentation

## 2015-08-22 DIAGNOSIS — S0990XA Unspecified injury of head, initial encounter: Secondary | ICD-10-CM | POA: Diagnosis present

## 2015-08-22 DIAGNOSIS — Z79899 Other long term (current) drug therapy: Secondary | ICD-10-CM | POA: Insufficient documentation

## 2015-08-22 LAB — URINALYSIS, MICROSCOPIC - CHCC
BILIRUBIN (URINE): NEGATIVE
Blood: NEGATIVE
Glucose: NEGATIVE mg/dL
KETONES: NEGATIVE mg/dL
NITRITE: NEGATIVE
PH: 6 (ref 4.6–8.0)
Protein: NEGATIVE mg/dL
Specific Gravity, Urine: 1.005 (ref 1.003–1.035)
Urobilinogen, UR: 0.2 mg/dL (ref 0.2–1)

## 2015-08-22 LAB — COMPREHENSIVE METABOLIC PANEL
ALBUMIN: 3.8 g/dL (ref 3.5–5.0)
ALK PHOS: 309 U/L — AB (ref 40–150)
ALT: 49 U/L (ref 0–55)
AST: 39 U/L — AB (ref 5–34)
Anion Gap: 10 mEq/L (ref 3–11)
BUN: 10.5 mg/dL (ref 7.0–26.0)
CHLORIDE: 104 meq/L (ref 98–109)
CO2: 25 meq/L (ref 22–29)
Calcium: 8.7 mg/dL (ref 8.4–10.4)
Creatinine: 1.3 mg/dL (ref 0.7–1.3)
EGFR: 67 mL/min/{1.73_m2} — AB (ref >90)
GLUCOSE: 106 mg/dL (ref 70–140)
POTASSIUM: 3.8 meq/L (ref 3.5–5.1)
SODIUM: 140 meq/L (ref 136–145)
Total Bilirubin: 0.55 mg/dL (ref 0.20–1.20)
Total Protein: 6.4 g/dL (ref 6.4–8.3)

## 2015-08-22 LAB — CBC WITH DIFFERENTIAL/PLATELET
BASO%: 0.2 % (ref 0.0–2.0)
BASOS ABS: 0 10*3/uL (ref 0.0–0.1)
EOS ABS: 0 10*3/uL (ref 0.0–0.5)
EOS%: 0 % (ref 0.0–7.0)
HCT: 42.8 % (ref 38.4–49.9)
HEMOGLOBIN: 13.9 g/dL (ref 13.0–17.1)
LYMPH#: 1.1 10*3/uL (ref 0.9–3.3)
LYMPH%: 31.7 % (ref 14.0–49.0)
MCH: 28 pg (ref 27.2–33.4)
MCHC: 32.4 g/dL (ref 32.0–36.0)
MCV: 86.5 fL (ref 79.3–98.0)
MONO#: 0.3 10*3/uL (ref 0.1–0.9)
MONO%: 8.5 % (ref 0.0–14.0)
NEUT#: 2.1 10*3/uL (ref 1.5–6.5)
NEUT%: 59.6 % (ref 39.0–75.0)
Platelets: 150 10*3/uL (ref 140–400)
RBC: 4.94 10*6/uL (ref 4.20–5.82)
RDW: 14.3 % (ref 11.0–14.6)
WBC: 3.6 10*3/uL — AB (ref 4.0–10.3)

## 2015-08-22 MED ORDER — SODIUM CHLORIDE 0.9 % IV BOLUS (SEPSIS)
1000.0000 mL | Freq: Once | INTRAVENOUS | Status: AC
  Administered 2015-08-22: 1000 mL via INTRAVENOUS

## 2015-08-22 MED ORDER — ONDANSETRON HCL 8 MG PO TABS
ORAL_TABLET | ORAL | Status: AC
  Filled 2015-08-22: qty 1

## 2015-08-22 MED ORDER — ONDANSETRON HCL 8 MG PO TABS
8.0000 mg | ORAL_TABLET | Freq: Once | ORAL | Status: AC
  Administered 2015-08-22: 8 mg via ORAL

## 2015-08-22 MED ORDER — HYDROMORPHONE HCL 1 MG/ML IJ SOLN
1.0000 mg | Freq: Once | INTRAMUSCULAR | Status: AC
  Administered 2015-08-22: 1 mg via INTRAVENOUS
  Filled 2015-08-22: qty 1

## 2015-08-22 MED ORDER — IOPAMIDOL (ISOVUE-300) INJECTION 61%
100.0000 mL | Freq: Once | INTRAVENOUS | Status: AC | PRN
  Administered 2015-08-22: 100 mL via INTRAVENOUS

## 2015-08-22 NOTE — Progress Notes (Signed)
Ct abdomen [id number] approved 08/22/15-09/20/15 @ [facility]

## 2015-08-22 NOTE — ED Notes (Signed)
RN will draw blood work and IV line

## 2015-08-22 NOTE — ED Notes (Signed)
Wheeled patient to restroom.  Then pt wheeled to [facility].

## 2015-08-22 NOTE — ED Notes (Signed)
MD at bedside. 

## 2015-08-22 NOTE — Telephone Encounter (Signed)
Reviewed MyChart message from pt.  Spoke with pt and instructed pt to come in now today for a visit with Jenny Reichmann, NP symptom management after labs.  Pt voiced understanding and en route to the clinic.

## 2015-08-22 NOTE — Progress Notes (Signed)
Ct auth # [id number] from [employer] [id number]

## 2015-08-22 NOTE — ED Provider Notes (Signed)
CSN: [id number]     Arrival date & time 08/22/15  1726 History   First MD Initiated Contact with Patient 08/22/15 2115     Chief Complaint  Patient presents with  . Abdominal Pain  . Fall     (Consider location/radiation/quality/duration/timing/severity/associated sxs/prior Treatment) Patient is a 50 y.o. male presenting with abdominal pain.  Abdominal Pain Pain location:  RUQ Pain quality: aching and sharp   Pain quality: not heavy and no pressure   Pain radiates to:  Does not radiate Pain severity:  Mild Onset quality:  Gradual Duration:  2 weeks Timing:  Constant Progression:  Worsening Chronicity:  New Context: not alcohol use, not eating, not previous surgeries and not recent illness   Relieved by:  None tried Worsened by:  Nothing tried Associated symptoms: no anorexia, no cough, no dysuria, no fever and no shortness of breath     Past Medical History  Diagnosis Date  . psoriatic arthritis   . H/O protein C deficiency   . Hx-TIA (transient ischemic attack)   . Clotting disorder ([place])   . Celiac disease   . H/O parotitis     right   . Polycythemia, secondary   . Transgendered   . Sleep apnea   . Neck pain   . Abnormal weight loss   . Gluten enteropathy   . 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 Anxiety     PTSD per patient   Past Surgical History  Procedure Laterality Date  . Thyroidectomy, partial  2008  . Cholecystectomy    . Ankle arthroscopy with reconstruction Right 2007  . Knee surgery Bilateral 1984    Right ACL, left PCL repair  . Liver biopsy  2013    normal results.  . Hip arthroscopy w/ labral repair Right 05/11/2013   Family History  Problem Relation Age of Onset  . Stroke Paternal Uncle     age 47  . Stroke Maternal Grandfather     29  . Hypertension Mother   . Psoriasis Mother   . Stroke Maternal Grandmother   . Congestive Heart Failure Maternal Grandmother   . Cancer Paternal Grandfather   . Protein C  deficiency Sister 48    Miscarriages   Social History  Substance Use Topics  . Smoking status: Never Smoker   . Smokeless tobacco: None  . Alcohol Use: Yes     Comment: wine once a month    Review of Systems  Constitutional: Negative for fever.  Eyes: Negative for itching.  Respiratory: Negative for cough and shortness of breath.   Gastrointestinal: Positive for abdominal pain. Negative for anorexia.  Endocrine: Negative for polydipsia and polyuria.  Genitourinary: Negative for dysuria, flank pain and enuresis.  All other systems reviewed and are negative.     Allergies  Penicillin g; Vancomycin; Wheat extract; Duloxetine; Gabapentin; and Sulfa antibiotics  Home Medications   Prior to Admission medications   Medication Sig Start Date End Date Taking? Authorizing Provider  Calcipotriene-Betameth Diprop (ENSTILAR) 0.005-0.064 % FOAM Apply 1 application topically at bedtime.   Yes Historical Provider, MD  clindamycin (CLEOCIN) 150 MG capsule Take 450 mg by mouth. One hour prior to dental procedures.   Yes Historical Provider, MD  clobetasol ointment (TEMOVATE) 4.62 % Apply 1 application topically 2 (two) times daily as needed (psoriasis).  07/13/13  Yes Historical Provider, MD  clorazepate (TRANXENE) 7.5 MG tablet Take one tablet by mouth three times daily Patient  taking differently: Take 7.5-15 mg by mouth 3 (three) times daily. Takes one in the morning, one around 3pm and two tablets at bedtime. 07/20/14  Yes Lauree Chandler, NP  cyclobenzaprine (FLEXERIL) 5 MG tablet Take 1 tablet (5 mg total) by mouth at bedtime. 05/10/15  Yes Richard August Saucer, MD  desonide (DESOWEN) 0.05 % ointment APPLY TO AFFECTED AREAS TWICE DAILY AS NEEDED FOR PSORIASIS. 08/04/15  Yes Historical Provider, MD  diclofenac sodium (VOLTAREN) 1 % GEL Apply 1 application topically 3 (three) times daily. To feet, hands, knees, elbows Patient taking differently: Apply 1 application topically as needed. To feet, hands,  knees, elbows 03/01/14  Yes Meredith Staggers, MD  morphine (KADIAN) 60 MG 24 hr capsule Take 1 capsule (60 mg total) by mouth every 12 (twelve) hours. 08/17/15  Yes Bayard Hugger, NP  NONFORMULARY OR COMPOUNDED ITEM Apply 1 application topically 3 (three) times daily. 8% ketamine, 5% amitriptyline, 5% baclofen, 5% gabapentin  60 GM Patient taking differently: Apply 1 application topically 3 (three) times daily as needed (pain.). 8% ketamine, 5% amitriptyline, 5% baclofen, 5% gabapentin  60 GM 04/16/15  Yes Meredith Staggers, MD  Oxycodone HCl 10 MG TABS Take 1 tablet (10 mg total) by mouth every 8 (eight) hours as needed. Patient taking differently: Take 10 mg by mouth 3 (three) times daily.  08/17/15  Yes Bayard Hugger, NP  polyethylene glycol (MIRALAX / GLYCOLAX) packet Take 17 g by mouth daily as needed for moderate constipation. Reported on 08/22/2015   Yes Historical Provider, MD  prazosin (MINIPRESS) 2 MG capsule Take 6-8 mg by mouth at bedtime.    Yes Historical Provider, MD  rivaroxaban (XARELTO) 20 MG TABS tablet Take 1 tablet (20 mg total) by mouth daily with supper. 01/29/15  Yes [name], MD  Secukinumab 150 MG/ML SOAJ Inject 1 each into the skin every 28 (twenty-eight) days. Reported on 08/17/2015   Yes Historical Provider, MD  testosterone cypionate (DEPO-TESTOSTERONE) 200 MG/ML injection Inject 0.25 mLs (50 mg total) into the muscle once a week. 04/06/15  Yes Renato Shin, MD  venlafaxine (EFFEXOR) 75 MG tablet Take 225 mg by mouth every morning.    Yes Historical Provider, MD   BP 123/83 mmHg  Pulse 72  Temp(Src) 97.8 F (36.6 C) (Oral)  Resp 23  SpO2 100% Physical Exam  Constitutional: He is oriented to person, place, and time. He appears well-developed and well-nourished.  HENT:  Head: Normocephalic and atraumatic.  Neck: Normal range of motion.  Cardiovascular: Normal rate.   Pulmonary/Chest: Effort normal. No respiratory distress.  Abdominal: Soft. He exhibits no  distension. There is tenderness (RUQ). There is no rebound and no guarding.  Musculoskeletal: Normal range of motion.  Neurological: He is alert and oriented to person, place, and time.  Skin: Skin is warm and dry.  Nursing note and vitals reviewed.   ED Course  Procedures (including critical care time) Labs Review Labs Reviewed  HEPATITIS PANEL, ACUTE    Imaging Review Dg Chest 2 View  08/22/2015  CLINICAL DATA:  Initial evaluation for acute right upper quadrant pain. EXAM: CHEST  2 VIEW COMPARISON:  Prior CT from 12/29/2013. FINDINGS: The cardiac and mediastinal silhouettes are stable in size and contour, and remain within normal limits. The lungs are normally inflated. No airspace consolidation, pleural effusion, or pulmonary edema is identified. There is no pneumothorax. No acute osseous abnormality identified. IMPRESSION: No active cardiopulmonary disease. Electronically Signed   By: Jeannine Boga  M.D.   On: 08/22/2015 22:07   Dg Elbow Complete Left  08/22/2015  CLINICAL DATA:  50 year old male with abrasion to the left elbow. EXAM: LEFT ELBOW - COMPLETE 3+ VIEW COMPARISON:  None. FINDINGS: There is no evidence of fracture, dislocation, or joint effusion. There is no evidence of arthropathy or other focal bone abnormality. Soft tissues are unremarkable. IMPRESSION: Negative. Electronically Signed   By: Anner Crete M.D.   On: 08/22/2015 22:06   Ct Head Wo Contrast  08/22/2015  CLINICAL DATA:  going down the ramp in his wheel chair and "was going really fast and couldn't catch up". Abrasion noted to left elbow. Stated he is seen with neuro for a cyst in the cervical spine area. States he is on Xarelto. Pt grimacing on assessment. EXAM: CT HEAD WITHOUT CONTRAST CT CERVICAL SPINE WITHOUT CONTRAST TECHNIQUE: Multidetector CT imaging of the head and cervical spine was performed following the standard protocol without intravenous contrast. Multiplanar CT image reconstructions of the  cervical spine were also generated. COMPARISON:  MRI 05/20/2014 and previous FINDINGS: CT HEAD FINDINGS Brain: No evidence of acute infarction, hemorrhage, extra-axial collection, ventriculomegaly, or mass effect. Vascular: No hyperdense vessel or unexpected calcification. Skull: Negative for fracture or focal lesion. Sinuses/Orbits: No acute findings. Other: None. CT CERVICAL SPINE FINDINGS Negative for fracture. No significant osseous degenerative change. Normal alignment. Mild narrowing of the C5-6 interspace. No prevertebral soft tissue swelling. Regional soft tissues unremarkable. IMPRESSION: 1. Negative for bleed or other acute intracranial process. 2. No acute cervical abnormality. Electronically Signed   By: Lucrezia Europe M.D.   On: 08/22/2015 22:31   Ct Cervical Spine Wo Contrast  08/22/2015  CLINICAL DATA:  going down the ramp in his wheel chair and "was going really fast and couldn't catch up". Abrasion noted to left elbow. Stated he is seen with neuro for a cyst in the cervical spine area. States he is on Xarelto. Pt grimacing on assessment. EXAM: CT HEAD WITHOUT CONTRAST CT CERVICAL SPINE WITHOUT CONTRAST TECHNIQUE: Multidetector CT imaging of the head and cervical spine was performed following the standard protocol without intravenous contrast. Multiplanar CT image reconstructions of the cervical spine were also generated. COMPARISON:  MRI 05/20/2014 and previous FINDINGS: CT HEAD FINDINGS Brain: No evidence of acute infarction, hemorrhage, extra-axial collection, ventriculomegaly, or mass effect. Vascular: No hyperdense vessel or unexpected calcification. Skull: Negative for fracture or focal lesion. Sinuses/Orbits: No acute findings. Other: None. CT CERVICAL SPINE FINDINGS Negative for fracture. No significant osseous degenerative change. Normal alignment. Mild narrowing of the C5-6 interspace. No prevertebral soft tissue swelling. Regional soft tissues unremarkable. IMPRESSION: 1. Negative for bleed  or other acute intracranial process. 2. No acute cervical abnormality. Electronically Signed   By: Lucrezia Europe M.D.   On: 08/22/2015 22:31   Ct Abdomen Pelvis W Contrast  08/22/2015  CLINICAL DATA:  Pt sent by cancer center. Pt has hx of polycythemia and has chronic DVT. Has had 4 day hx of RUQ pain radiating to R middle back. Labs and urine done at cancer center. Was sent here for further imaging. Does not have gallbladder EXAM: CT ABDOMEN AND PELVIS WITH CONTRAST TECHNIQUE: Multidetector CT imaging of the abdomen and pelvis was performed using the standard protocol following bolus administration of intravenous contrast. CONTRAST:  169m ISOVUE-300 IOPAMIDOL (ISOVUE-300) INJECTION 61% COMPARISON:  12/29/2013 FINDINGS: Lower chest:  No acute findings. Hepatobiliary: Cholecystectomy clips. Mild central intrahepatic biliary ductal dilatation and distention of the common duct, stable. No focal liver lesion.  Pancreas: No mass, inflammatory changes, or other significant abnormality. Spleen: Within normal limits in size and appearance. Adrenals/Urinary Tract: Prominent left extrarenal collecting system and mild left hydronephrosis, stable. No nephrolithiasis or solid renal lesion. Ureters decompressed. Urinary bladder incompletely distended. Stomach/Bowel: No evidence of obstruction, inflammatory process, or abnormal fluid collections. Normal appendix. Vascular/Lymphatic: No pathologically enlarged lymph nodes. No evidence of abdominal aortic aneurysm. Bilateral pelvic phleboliths. Reproductive: No mass or other significant abnormality. Other: No ascites.  No free air. Musculoskeletal: Degenerative disc disease L5-S1. Negative for fracture. IMPRESSION: 1. No acute abdominal process. 2. Chronic mild left hydronephrosis. Electronically Signed   By: Lucrezia Europe M.D.   On: 08/22/2015 22:37   I have personally reviewed and evaluated these images and lab results as part of my medical decision-making.   EKG  Interpretation None      MDM   Final diagnoses:  Right upper quadrant pain    Acute worsening of abdominal pain over last few weeks. Ruq. Ct negative. Exam unchanged but no peritonitis or e/o emergent causes. Plan for continued follow up with pcp.  Also fell in parking lot so ct of head and neck with xr's done.   New Prescriptions: Discharge Medication List as of 08/23/2015 12:19 AM       I have personally and contemperaneously reviewed labs and imaging and used in my decision making as above.   A medical screening exam was performed and I feel the patient has had an appropriate workup for their chief complaint at this time and likelihood of emergent condition existing is low and thus workup can continue on an outpatient basis.. Their vital signs are stable. They have been counseled on decision, discharge, follow up and which symptoms necessitate immediate return to the emergency department.  They verbally stated understanding and agreement with plan and discharged in stable condition.      Merrily Pew, MD 08/23/15 [phone]

## 2015-08-22 NOTE — ED Notes (Signed)
Below order not completed by EW.

## 2015-08-22 NOTE — ED Notes (Signed)
Pt was outside and accidentally flipped backwards out of his wheelchair. States he hit the back of his head, he has an abrasion to his L elbow and L shoulder pain.

## 2015-08-22 NOTE — Telephone Encounter (Signed)
Add [facility] apt per pof, pt en route per pof

## 2015-08-22 NOTE — ED Notes (Signed)
Patient transported to CT 

## 2015-08-22 NOTE — ED Notes (Signed)
Pt stated he was originally here today to get a CT scan but has PTSD and asked if he could come back tomorrow.  According to pt, he was leaving and was going down the ramp in his wheel chair and "was going really fast and couldn't catch up".  Abrasion noted to left elbow.  Stated he is seen with neuro for a cyst in the cervical spine area.  States he is on Xarelto. Pt grimacing on assessment.

## 2015-08-22 NOTE — ED Notes (Signed)
Pt sent by cancer center. Pt has hx of polycythemia and has chronic DVT. Has had 4 day hx of RUQ pain radiating to R middle back. Labs and urine done at cancer center. Was sent here for further imaging. Does not have gallbladder. Jenny Reichmann NP left number [phone]

## 2015-08-23 ENCOUNTER — Encounter: Payer: Self-pay | Admitting: Nurse Practitioner

## 2015-08-23 ENCOUNTER — Ambulatory Visit: Payer: BLUE CROSS/BLUE SHIELD | Admitting: Psychology

## 2015-08-23 DIAGNOSIS — R1011 Right upper quadrant pain: Secondary | ICD-10-CM | POA: Insufficient documentation

## 2015-08-23 DIAGNOSIS — Z7901 Long term (current) use of anticoagulants: Secondary | ICD-10-CM | POA: Insufficient documentation

## 2015-08-23 LAB — URINE CULTURE

## 2015-08-23 MED ORDER — TETANUS-DIPHTH-ACELL PERTUSSIS 5-2.5-18.5 LF-MCG/0.5 IM SUSP: 0.5000 mL | Freq: Once | INTRAMUSCULAR | Status: DC

## 2015-08-23 NOTE — Progress Notes (Signed)
SYMPTOM MANAGEMENT CLINIC    Chief Complaint: Abdominal pain  HPI:  Jesse Bowers 50 y.o. male diagnosed with polycythemia/protein C deficiency.  Currently receives therapeutic phlebotomies on an as-needed basis.  Patient reports to the [facility] today with the complaint of a four-day history of right upper quadrant pain.  He states that the pain in the right upper quadrant is progressively worsening.  He states that he is having some chronic nausea as well.  He states that when he eats or drinks anything; the nausea increases.  He denies any vomiting, diarrhea, or constipation.  He also denies any recent fevers or chills.  Patient does report some increased urinary frequency and urgency; but no dysuria or hematuria.    No history exists.    Review of Systems  Constitutional: Positive for weight loss.  Gastrointestinal: Positive for nausea and abdominal pain. Negative for vomiting.  All other systems reviewed and are negative.   Past Medical History  Diagnosis Date  . psoriatic arthritis   . H/O protein C deficiency   . Hx-TIA (transient ischemic attack)   . Clotting disorder ([place])   . Celiac disease   . H/O parotitis     right   . Polycythemia, secondary   . Transgendered   . Sleep apnea   . Neck pain   . Abnormal weight loss   . Gluten enteropathy   . 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 Anxiety     PTSD per patient    Past Surgical History  Procedure Laterality Date  . Thyroidectomy, partial  2008  . Cholecystectomy    . Ankle arthroscopy with reconstruction Right 2007  . Knee surgery Bilateral 1984    Right ACL, left PCL repair  . Liver biopsy  2013    normal results.  . 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    is allergic to penicillin g; vancomycin; wheat extract; duloxetine; gabapentin; and sulfa antibiotics.    Medication List       This list is accurate as of: 08/22/15  5:29 PM.  Always use your most recent med list.               clindamycin 150 MG capsule  Commonly known as:  CLEOCIN  Take 450 mg by mouth. One hour prior to dental procedures.     clobetasol ointment 0.05 %  Commonly known as:  TEMOVATE  Apply 1 application topically 2 (two) times daily as needed (psoriasis).     clorazepate 7.5 MG tablet  Commonly known as:  TRANXENE  Take one tablet by mouth three times daily     cyclobenzaprine 5 MG tablet  Commonly known as:  FLEXERIL  Take 1 tablet (5 mg total) by mouth at bedtime.  desonide 0.05 % ointment  Commonly known as:  DESOWEN  APPLY TO AFFECTED AREAS TWICE DAILY AS NEEDED FOR PSORIASIS.     diclofenac sodium 1 % Gel  Commonly known as:  VOLTAREN  Apply 1 application topically 3 (three) times daily. To feet, hands, knees, elbows     ENSTILAR 0.005-0.064 % Foam  Generic drug:  Calcipotriene-Betameth Diprop  Apply 1 application topically at bedtime.     morphine 60 MG 24 hr capsule  Commonly known as:  KADIAN  Take 1 capsule (60 mg total) by mouth every 12 (twelve) hours.     NONFORMULARY OR COMPOUNDED ITEM  Apply 1 application topically  3 (three) times daily. 8% ketamine, 5% amitriptyline, 5% baclofen, 5% gabapentin  60 GM     Oxycodone HCl 10 MG Tabs  Take 1 tablet (10 mg total) by mouth every 8 (eight) hours as needed.     polyethylene glycol packet  Commonly known as:  MIRALAX / GLYCOLAX  Take 17 g by mouth daily as needed for moderate constipation. Reported on 08/22/2015     prazosin 2 MG capsule  Commonly known as:  MINIPRESS  Take 6-8 mg by mouth at bedtime.     rivaroxaban 20 MG Tabs tablet  Commonly known as:  XARELTO  Take 1 tablet (20 mg total) by mouth daily with supper.     Secukinumab 150 MG/ML Soaj  Inject 1 each into the skin every 28 (twenty-eight) days. Reported on 08/17/2015     testosterone cypionate 200 MG/ML injection  Commonly known as:  DEPO-TESTOSTERONE  Inject 0.25 mLs (50 mg total) into the muscle once a week.     venlafaxine 75 MG tablet  Commonly known as:  EFFEXOR  Take 225 mg by mouth every morning.         PHYSICAL EXAMINATION  Oncology Vitals 08/23/2015 08/23/2015  Temp - -  Pulse 72 71  Resp 23 14  SpO2 100 93   BP Readings from Last 2 Encounters:  08/23/15 123/83  08/22/15 128/76    Physical Exam  Constitutional: He is oriented to person, place, and time and well-developed, well-nourished, and in no distress.  HENT:  Head: Normocephalic and atraumatic.  Eyes: Conjunctivae and EOM are normal. Pupils are equal, round, and reactive to light. Right eye exhibits no discharge. Left eye exhibits no discharge. No scleral icterus.  Neck: Normal range of motion. Neck supple. No JVD present. No tracheal deviation present. No thyromegaly present.  Cardiovascular: Normal rate, regular rhythm, normal heart sounds and intact distal pulses.   Pulmonary/Chest: Effort normal and breath sounds normal. No respiratory distress. He has no wheezes. He has no rales. He exhibits no tenderness.  Abdominal: Soft. Bowel sounds are normal. He exhibits no distension and no mass. There is  tenderness. There is no rebound and no guarding.  Abdomen soft with bowel sounds positive in all 4 quads.  There was no flank tenderness.  There was some tenderness with palpation to the right upper quadrant only.  Musculoskeletal: Normal range of motion. He exhibits no edema or tenderness.  Lymphadenopathy:    He has no cervical adenopathy.  Neurological: He is alert and oriented to person, place, and time. Gait normal.  Patient presented to the cancer center in a wheelchair.  He states that he is in a wheelchair on a fairly regular basis, or using a walker at home due to his chronic pain issues.  He was able to the [place] up out of a wheelchair  and ambulate about the exam room with no difficulty.  Skin: Skin is warm and dry. No rash noted. No erythema. No pallor.  Psychiatric: Affect normal.  Nursing note and vitals reviewed.   LABORATORY DATA:. Appointment on 08/22/2015  Component Date Value Ref Range Status  . WBC 08/22/2015 3.6* 4.0 - 10.3 10e3/uL Final  . NEUT# 08/22/2015 2.1  1.5 - 6.5 10e3/uL Final  . HGB 08/22/2015 13.9  13.0 - 17.1 g/dL Final  . HCT 08/22/2015 42.8  38.4 - 49.9 % Final  . Platelets 08/22/2015 150  140 - 400 10e3/uL Final  . MCV 08/22/2015 86.5  79.3 - 98.0 fL Final  . MCH 08/22/2015 28.0  27.2 - 33.4 pg Final  . MCHC 08/22/2015 32.4  32.0 - 36.0 g/dL Final  . RBC 08/22/2015 4.94  4.20 - 5.82 10e6/uL Final  . RDW 08/22/2015 14.3  11.0 - 14.6 % Final  . lymph# 08/22/2015 1.1  0.9 - 3.3 10e3/uL Final  . MONO# 08/22/2015 0.3  0.1 - 0.9 10e3/uL Final  . Eosinophils Absolute 08/22/2015 0.0  0.0 - 0.5 10e3/uL Final  . Basophils Absolute 08/22/2015 0.0  0.0 - 0.1 10e3/uL Final  . NEUT% 08/22/2015 59.6  39.0 - 75.0 % Final  . LYMPH% 08/22/2015 31.7  14.0 - 49.0 % Final  . MONO% 08/22/2015 8.5  0.0 - 14.0 % Final  . EOS% 08/22/2015 0.0  0.0 - 7.0 % Final  . BASO% 08/22/2015 0.2  0.0 - 2.0 % Final  . Sodium 08/22/2015 140  136 - 145 mEq/L Final  . Potassium  08/22/2015 3.8  3.5 - 5.1 mEq/L Final  . Chloride 08/22/2015 104  98 - 109 mEq/L Final  . CO2 08/22/2015 25  22 - 29 mEq/L Final  . Glucose 08/22/2015 106  70 - 140 mg/dl Final   Glucose reference range is for nonfasting patients. Fasting glucose reference range is 70- 100.  [name] BUN 08/22/2015 10.5  7.0 - 26.0 mg/dL Final  . Creatinine 08/22/2015 1.3  0.7 - 1.3 mg/dL Final  . Total Bilirubin 08/22/2015 0.55  0.20 - 1.20 mg/dL Final  . Alkaline Phosphatase 08/22/2015 309* 40 - 150 U/L Final  . AST 08/22/2015 39* 5 - 34 U/L Final  . ALT 08/22/2015 49  0 - 55 U/L Final  . Total Protein 08/22/2015 6.4  6.4 - 8.3 g/dL Final  . Albumin 08/22/2015 3.8  3.5 - 5.0 g/dL Final  . Calcium 08/22/2015 8.7  8.4 - 10.4 mg/dL Final  . Anion Gap 08/22/2015 10  3 - 11 mEq/L Final  . EGFR 08/22/2015 67* >90 ml/min/1.73 m2 Final   eGFR is calculated using the CKD-EPI Creatinine Equation (2009)  . Glucose 08/22/2015 Negative  Negative mg/dL Final  . Bilirubin (Urine) 08/22/2015 Negative  Negative Final  . Ketones 08/22/2015 Negative  Negative mg/dL Final  . Specific Gravity, Urine 08/22/2015 1.005  1.003 - 1.035 Final  . Blood 08/22/2015 Negative  Negative Final  . pH 08/22/2015 6.0  4.6 - 8.0 Final  . Protein 08/22/2015 Negative  Negative- <30 mg/dL Final  . Urobilinogen, UR 08/22/2015 0.2  0.2 - 1 mg/dL Final  . Nitrite 08/22/2015 Negative  Negative Final  . Leukocyte Esterase 08/22/2015 Trace  Negative Final  . RBC / HPF 08/22/2015 0-2  0 - 2 Final  . WBC, UA 08/22/2015 0-2  0 - 2 Final  . Bacteria, UA 08/22/2015 Few  Negative- Trace Final  . Epithelial Cells 08/22/2015 Occasional  Negative- Few Final  RADIOGRAPHIC STUDIES: Dg Chest 2 View  08/22/2015  CLINICAL DATA:  Initial evaluation for acute right upper quadrant pain. EXAM: CHEST  2 VIEW COMPARISON:  Prior CT from 12/29/2013. FINDINGS: The cardiac and mediastinal silhouettes are stable in size and contour, and remain within normal limits. The  lungs are normally inflated. No airspace consolidation, pleural effusion, or pulmonary edema is identified. There is no pneumothorax. No acute osseous abnormality identified. IMPRESSION: No active cardiopulmonary disease. Electronically Signed   By: Jeannine Boga M.D.   On: 08/22/2015 22:07   Dg Elbow Complete Left  08/22/2015  CLINICAL DATA:  50 year old male with abrasion to the left elbow. EXAM: LEFT ELBOW - COMPLETE 3+ VIEW COMPARISON:  None. FINDINGS: There is no evidence of fracture, dislocation, or joint effusion. There is no evidence of arthropathy or other focal bone abnormality. Soft tissues are unremarkable. IMPRESSION: Negative. Electronically Signed   By: Anner Crete M.D.   On: 08/22/2015 22:06   Ct Head Wo Contrast  08/22/2015  CLINICAL DATA:  going down the ramp in his wheel chair and "was going really fast and couldn't catch up". Abrasion noted to left elbow. Stated he is seen with neuro for a cyst in the cervical spine area. States he is on Xarelto. Pt grimacing on assessment. EXAM: CT HEAD WITHOUT CONTRAST CT CERVICAL SPINE WITHOUT CONTRAST TECHNIQUE: Multidetector CT imaging of the head and cervical spine was performed following the standard protocol without intravenous contrast. Multiplanar CT image reconstructions of the cervical spine were also generated. COMPARISON:  MRI 05/20/2014 and previous FINDINGS: CT HEAD FINDINGS Brain: No evidence of acute infarction, hemorrhage, extra-axial collection, ventriculomegaly, or mass effect. Vascular: No hyperdense vessel or unexpected calcification. Skull: Negative for fracture or focal lesion. Sinuses/Orbits: No acute findings. Other: None. CT CERVICAL SPINE FINDINGS Negative for fracture. No significant osseous degenerative change. Normal alignment. Mild narrowing of the C5-6 interspace. No prevertebral soft tissue swelling. Regional soft tissues unremarkable. IMPRESSION: 1. Negative for bleed or other acute intracranial process. 2. No  acute cervical abnormality. Electronically Signed   By: Lucrezia Europe M.D.   On: 08/22/2015 22:31   Ct Cervical Spine Wo Contrast  08/22/2015  CLINICAL DATA:  going down the ramp in his wheel chair and "was going really fast and couldn't catch up". Abrasion noted to left elbow. Stated he is seen with neuro for a cyst in the cervical spine area. States he is on Xarelto. Pt grimacing on assessment. EXAM: CT HEAD WITHOUT CONTRAST CT CERVICAL SPINE WITHOUT CONTRAST TECHNIQUE: Multidetector CT imaging of the head and cervical spine was performed following the standard protocol without intravenous contrast. Multiplanar CT image reconstructions of the cervical spine were also generated. COMPARISON:  MRI 05/20/2014 and previous FINDINGS: CT HEAD FINDINGS Brain: No evidence of acute infarction, hemorrhage, extra-axial collection, ventriculomegaly, or mass effect. Vascular: No hyperdense vessel or unexpected calcification. Skull: Negative for fracture or focal lesion. Sinuses/Orbits: No acute findings. Other: None. CT CERVICAL SPINE FINDINGS Negative for fracture. No significant osseous degenerative change. Normal alignment. Mild narrowing of the C5-6 interspace. No prevertebral soft tissue swelling. Regional soft tissues unremarkable. IMPRESSION: 1. Negative for bleed or other acute intracranial process. 2. No acute cervical abnormality. Electronically Signed   By: Lucrezia Europe M.D.   On: 08/22/2015 22:31   Ct Abdomen Pelvis W Contrast  08/22/2015  CLINICAL DATA:  Pt sent by cancer center. Pt has hx of polycythemia and has chronic DVT. Has had 4 day hx of RUQ pain radiating to R  middle back. Labs and urine done at cancer center. Was sent here for further imaging. Does not have gallbladder EXAM: CT ABDOMEN AND PELVIS WITH CONTRAST TECHNIQUE: Multidetector CT imaging of the abdomen and pelvis was performed using the standard protocol following bolus administration of intravenous contrast. CONTRAST:  142m ISOVUE-300 IOPAMIDOL  (ISOVUE-300) INJECTION 61% COMPARISON:  12/29/2013 FINDINGS: Lower chest:  No acute findings. Hepatobiliary: Cholecystectomy clips. Mild central intrahepatic biliary ductal dilatation and distention of the common duct, stable. No focal liver lesion. Pancreas: No mass, inflammatory changes, or other significant abnormality. Spleen: Within normal limits in size and appearance. Adrenals/Urinary Tract: Prominent left extrarenal collecting system and mild left hydronephrosis, stable. No nephrolithiasis or solid renal lesion. Ureters decompressed. Urinary bladder incompletely distended. Stomach/Bowel: No evidence of obstruction, inflammatory process, or abnormal fluid collections. Normal appendix. Vascular/Lymphatic: No pathologically enlarged lymph nodes. No evidence of abdominal aortic aneurysm. Bilateral pelvic phleboliths. Reproductive: No mass or other significant abnormality. Other: No ascites.  No free air. Musculoskeletal: Degenerative disc disease L5-S1. Negative for fracture. IMPRESSION: 1. No acute abdominal process. 2. Chronic mild left hydronephrosis. Electronically Signed   By: DLucrezia EuropeM.D.   On: 08/22/2015 22:37    ASSESSMENT/PLAN:    Right upper quadrant abdominal pain [place] [facility] [place] [place]-day history of right upper quadrant pain.  He states that the pain in the right upper quadrant is progressively worsening.  He states that he is having some chronic nausea as well.  He states that when he eats or drinks anything; the nausea increases.  He denies any vomiting, diarrhea, or constipation.  He also denies any recent fevers or chills.  Patient does report some increased urinary frequency and urgency; but no dysuria or hematuria.  Urinalysis obtained today revealed:    Ref Range 1d ago    Glucose Negative mg/dL Negative   Bilirubin (Urine) Negative  Negative   Ketones Negative mg/dL Negative   Specific Gravity, Urine 1.003 - 1.035  1.005    Blood Negative  Negative   pH 4.6 - 8.0  6.0   Protein Negative- <30 mg/dL Negative   Urobilinogen, UR 0.2 - 1 mg/dL 0.2   Nitrite Negative  Negative   Leukocyte Esterase Negative  Trace   RBC / HPF 0 - 2  0-2   WBC, UA 0 - 2  0-2   Bacteria, UA Negative- Trace  Few   Epithelial Cells Negative- Few  Occasional   Resulting Agency [facility]            Exam today reveals bowel sounds positive in all 4 quads.  Abdomen soft with palpation.  There was no flank pain.  Patient did have some tenderness to the right upper quadrant with palpation.  Labs obtained today were all within normal limits.  Vital signs were stable as well.  Patient was afebrile.  Non-discussion with patient regarding options for further evaluation and management this evening.  Given patient's complaint of progressive abdominal discomfort-patient will be transported to the emergency department for further evaluation and management tonight.  Brief history and report were called to the emergency department charge nurse; prior to the patient being transported to the emergency department via wheelchair per the [facility]  Code status: Full code.     Polycythemia, secondary Patient has been diagnosed with polycythemia and a protein C deficiency.  He receives therapeutic phlebotomies on an as-needed basis.  Patient is scheduled to return for labs and  a possible phlebotomy on 11/07/2015.    Long term (current) use of anticoagulants Patient has a history of both the upper and a lower extremity DVT in the past; and continues to take Xarelto as directed.  He denies any bleeding issues whatsoever recently.   Patient stated understanding of all instructions; and was in agreement with this plan of care. The patient knows to call the clinic with any problems, questions or concerns.   Total time spent with patient was 40 minutes;  with greater than 75 percent of that time spent in face to face  counseling regarding patient's symptoms,  and coordination of care and follow up.  Disclaimer:This dictation was prepared with Dragon/digital dictation along with [employer]. Any transcriptional errors that result from this process are unintentional.  Drue Second, NP 08/23/2015

## 2015-08-23 NOTE — ED Notes (Signed)
Pt wheeled to lobby.  Pt alert and oriented and stable at discharge.

## 2015-08-23 NOTE — Assessment & Plan Note (Signed)
Patient has been diagnosed with polycythemia and a protein C deficiency.  He receives therapeutic phlebotomies on an as-needed basis.  Patient is scheduled to return for labs and a possible phlebotomy on 11/07/2015.

## 2015-08-23 NOTE — Assessment & Plan Note (Signed)
Patient reports to the [facility] today with the complaint of a four-day history of right upper quadrant pain.  He states that the pain in the right upper quadrant is progressively worsening.  He states that he is having some chronic nausea as well.  He states that when he eats or drinks anything; the nausea increases.  He denies any vomiting, diarrhea, or constipation.  He also denies any recent fevers or chills.  Patient does report some increased urinary frequency and urgency; but no dysuria or hematuria.  Urinalysis obtained today revealed:    Ref Range 1d ago    Glucose Negative mg/dL Negative   Bilirubin (Urine) Negative  Negative   Ketones Negative mg/dL Negative   Specific Gravity, Urine 1.003 - 1.035  1.005   Blood Negative  Negative   pH 4.6 - 8.0  6.0   Protein Negative- <30 mg/dL Negative   Urobilinogen, UR 0.2 - 1 mg/dL 0.2   Nitrite Negative  Negative   Leukocyte Esterase Negative  Trace   RBC / HPF 0 - 2  0-2   WBC, UA 0 - 2  0-2   Bacteria, UA Negative- Trace  Few   Epithelial Cells Negative- Few  Occasional   Resulting Agency [facility]            Exam today reveals bowel sounds positive in all 4 quads.  Abdomen soft with palpation.  There was no flank pain.  Patient did have some tenderness to the right upper quadrant with palpation.  Labs obtained today were all within normal limits.  Vital signs were stable as well.  Patient was afebrile.  Non-discussion with patient regarding options for further evaluation and management this evening.  Given patient's complaint of progressive abdominal discomfort-patient will be transported to the emergency department for further evaluation and management tonight.  Brief history and report were called to the emergency department charge nurse; prior to the patient being transported to the emergency department via wheelchair per the [place].  Code status: Full code.

## 2015-08-23 NOTE — Assessment & Plan Note (Signed)
Patient has a history of both the upper and a lower extremity DVT in the past; and continues to take Xarelto as directed.  He denies any bleeding issues whatsoever recently.

## 2015-08-24 LAB — HEPATITIS PANEL, ACUTE
HCV Ab: <0.1 s/co ratio (ref 0.0–0.9)
HEP B C IGM: NEGATIVE
HEP B S AG: NEGATIVE
Hep A IgM: NEGATIVE

## 2015-08-26 LAB — TOXASSURE SELECT,+ANTIDEPR,UR: PDF: 0

## 2015-08-26 LAB — 6-ACETYLMORPHINE,TOXASSURE ADD
6-ACETYLMORPHINE: NEGATIVE
6-ACETYLMORPHINE: NOT DETECTED ng/mg{creat}

## 2015-08-28 ENCOUNTER — Ambulatory Visit: Payer: BLUE CROSS/BLUE SHIELD | Admitting: Psychology

## 2015-08-30 ENCOUNTER — Ambulatory Visit (INDEPENDENT_AMBULATORY_CARE_PROVIDER_SITE_OTHER): Payer: BLUE CROSS/BLUE SHIELD | Admitting: Psychology

## 2015-08-31 NOTE — Progress Notes (Signed)
Urine drug screen for this encounter is consistent for prescribed medications.   

## 2015-09-06 ENCOUNTER — Ambulatory Visit (INDEPENDENT_AMBULATORY_CARE_PROVIDER_SITE_OTHER): Payer: BLUE CROSS/BLUE SHIELD | Admitting: Psychology

## 2015-09-06 DIAGNOSIS — F4310 Post-traumatic stress disorder, unspecified: Secondary | ICD-10-CM | POA: Diagnosis not present

## 2015-09-07 ENCOUNTER — Other Ambulatory Visit: Payer: Self-pay | Admitting: Hematology

## 2015-09-10 ENCOUNTER — Ambulatory Visit (INDEPENDENT_AMBULATORY_CARE_PROVIDER_SITE_OTHER): Payer: BLUE CROSS/BLUE SHIELD | Admitting: Psychology

## 2015-09-10 DIAGNOSIS — F4310 Post-traumatic stress disorder, unspecified: Secondary | ICD-10-CM

## 2015-09-13 ENCOUNTER — Ambulatory Visit: Payer: BLUE CROSS/BLUE SHIELD | Admitting: Psychology

## 2015-09-13 ENCOUNTER — Telehealth: Payer: Self-pay

## 2015-09-13 NOTE — Telephone Encounter (Signed)
Pt needs a refill on his Morphine. His last visit was 08/17/15. His next visit is 09/26/15. Please advise on refill.

## 2015-09-14 ENCOUNTER — Encounter: Payer: Self-pay | Admitting: Registered Nurse

## 2015-09-14 ENCOUNTER — Encounter: Payer: BLUE CROSS/BLUE SHIELD | Attending: Physical Medicine & Rehabilitation | Admitting: Registered Nurse

## 2015-09-14 VITALS — BP 106/74 | HR 97

## 2015-09-14 DIAGNOSIS — M7712 Lateral epicondylitis, left elbow: Secondary | ICD-10-CM | POA: Diagnosis not present

## 2015-09-14 DIAGNOSIS — Z87890 Personal history of sex reassignment: Secondary | ICD-10-CM | POA: Insufficient documentation

## 2015-09-14 DIAGNOSIS — G8929 Other chronic pain: Secondary | ICD-10-CM | POA: Diagnosis present

## 2015-09-14 DIAGNOSIS — I69398 Other sequelae of cerebral infarction: Secondary | ICD-10-CM | POA: Diagnosis not present

## 2015-09-14 DIAGNOSIS — G8101 Flaccid hemiplegia affecting right dominant side: Secondary | ICD-10-CM | POA: Diagnosis not present

## 2015-09-14 DIAGNOSIS — M25562 Pain in left knee: Secondary | ICD-10-CM

## 2015-09-14 DIAGNOSIS — R209 Unspecified disturbances of skin sensation: Secondary | ICD-10-CM | POA: Insufficient documentation

## 2015-09-14 DIAGNOSIS — M13862 Other specified arthritis, left knee: Secondary | ICD-10-CM | POA: Diagnosis not present

## 2015-09-14 DIAGNOSIS — Z7901 Long term (current) use of anticoagulants: Secondary | ICD-10-CM | POA: Insufficient documentation

## 2015-09-14 DIAGNOSIS — I69351 Hemiplegia and hemiparesis following cerebral infarction affecting right dominant side: Secondary | ICD-10-CM | POA: Insufficient documentation

## 2015-09-14 DIAGNOSIS — L4050 Arthropathic psoriasis, unspecified: Secondary | ICD-10-CM | POA: Diagnosis not present

## 2015-09-14 DIAGNOSIS — Z79899 Other long term (current) drug therapy: Secondary | ICD-10-CM | POA: Diagnosis not present

## 2015-09-14 DIAGNOSIS — M545 Low back pain, unspecified: Secondary | ICD-10-CM

## 2015-09-14 DIAGNOSIS — Z9181 History of falling: Secondary | ICD-10-CM | POA: Insufficient documentation

## 2015-09-14 DIAGNOSIS — S43002A Unspecified subluxation of left shoulder joint, initial encounter: Secondary | ICD-10-CM | POA: Insufficient documentation

## 2015-09-14 DIAGNOSIS — I69320 Aphasia following cerebral infarction: Secondary | ICD-10-CM | POA: Insufficient documentation

## 2015-09-14 DIAGNOSIS — D6859 Other primary thrombophilia: Secondary | ICD-10-CM | POA: Diagnosis not present

## 2015-09-14 DIAGNOSIS — K900 Celiac disease: Secondary | ICD-10-CM | POA: Diagnosis not present

## 2015-09-14 DIAGNOSIS — F418 Other specified anxiety disorders: Secondary | ICD-10-CM | POA: Diagnosis not present

## 2015-09-14 DIAGNOSIS — M25561 Pain in right knee: Secondary | ICD-10-CM

## 2015-09-14 DIAGNOSIS — Z96652 Presence of left artificial knee joint: Secondary | ICD-10-CM

## 2015-09-14 DIAGNOSIS — Z79891 Long term (current) use of opiate analgesic: Secondary | ICD-10-CM | POA: Diagnosis not present

## 2015-09-14 DIAGNOSIS — Z86718 Personal history of other venous thrombosis and embolism: Secondary | ICD-10-CM | POA: Insufficient documentation

## 2015-09-14 DIAGNOSIS — D751 Secondary polycythemia: Secondary | ICD-10-CM | POA: Diagnosis not present

## 2015-09-14 DIAGNOSIS — G894 Chronic pain syndrome: Secondary | ICD-10-CM

## 2015-09-14 DIAGNOSIS — Z5181 Encounter for therapeutic drug level monitoring: Secondary | ICD-10-CM

## 2015-09-14 DIAGNOSIS — M7522 Bicipital tendinitis, left shoulder: Secondary | ICD-10-CM | POA: Insufficient documentation

## 2015-09-14 MED ORDER — MORPHINE SULFATE ER 60 MG PO CP24: 60.0000 mg | ORAL_CAPSULE | Freq: Two times a day (BID) | ORAL | Status: DC

## 2015-09-14 MED ORDER — OXYCODONE HCL 10 MG PO TABS: 10.0000 mg | ORAL_TABLET | Freq: Three times a day (TID) | ORAL | Status: DC | PRN

## 2015-09-14 NOTE — Progress Notes (Signed)
Subjective:    Patient ID: Jesse Bowers, male    DOB: [birth year], 50 y.o.   MRN: [id number]  HPI: Jesse Bowers is a 50 year old male who returns for follow-up for chronic pain and medication refill. He states his pain is located in his bilateral shoulder's,left elbow, lower-back and left knee.  He rates his pain 8. His current exercise regime is performing Yoga and walking with his walker short distances.  Also states he is scheduled for nasal surgery on 09/20/2015 with Dr. Jacinto Reap. Scheduled to see Dr. Caralyn Guile orthopaedist next week. Has a f/u appointment with Dr. Wynelle Link regarding Left Knee surgery. Jesse Bowers was in [place] parking lot on 08/22/15 while riding in his wheelchair he states it tipped over and he landed on his head. He went to [facility] for evaluation.  Pain Inventory Average Pain 9 Pain Right Now 8 My pain is constant, sharp and aching  In the last 24 hours, has pain interfered with the following? General activity 0 Relation with others 0 Enjoyment of life 0 What TIME of day is your pain at its worst? all Sleep (in general) NA  Pain is worse with: some activites Pain improves with: medication Relief from Meds: not answered  Mobility walk with assistance use a walker how many minutes can you walk? 10 ability to climb steps?  yes do you drive?  yes use a wheelchair transfers alone  Function disabled: date disabled 02/17/13 I need assistance with the following:  dressing, bathing, meal prep, household duties and shopping  Neuro/Psych weakness trouble walking anxiety  Prior Studies Any changes since last visit?  no  Physicians involved in your care Any changes since last visit?  no   Family History  Problem Relation Age of Onset  . Stroke Paternal Uncle     age 27  . Stroke Maternal Grandfather     98  . Hypertension Mother   . Psoriasis Mother   . Stroke Maternal Grandmother   . Congestive Heart Failure Maternal Grandmother   .  Cancer Paternal Grandfather   . Protein C deficiency Sister 19    Miscarriages   Social History   Social History  . Marital Status: Married    Spouse Name: N/A  . Number of Children: 2  . 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 Smoking status: Never Smoker   . Smokeless tobacco: None  . Alcohol Use: Yes     Comment: wine once a month  . Drug Use: No  . Sexual Activity: Yes    Birth Control/ Protection: None     Comment: patient is a transgender on testosterone shots, no biological kids   Other Topics Concern  . None   Social History Narrative   Education 4 year college, former Therapist, sports X 15 years, pediatric nurse practitioner x 6 years, did NP degree from [employer] [place]. Relocated to [place] about 2 months ago from [place], [place]. Patient was in [place] for last 4 years and prior to that in [place]. His wife is working as Scientist, research (physical sciences) for [employer]. Patient is not working and applying for disability. They have 2 kids but no biologic children.    Past Surgical History  Procedure Laterality Date  . Thyroidectomy, partial  2008  . Cholecystectomy    . Ankle arthroscopy with reconstruction Right 2007  .  Knee surgery Bilateral 1984    Right ACL, left PCL repair  . Liver biopsy  2013    normal results.  . Hip arthroscopy w/ labral repair Right 05/11/2013   Past Medical History  Diagnosis Date  . psoriatic arthritis   . H/O protein C deficiency   . Hx-TIA (transient ischemic attack)   . Clotting disorder ([place])   . Celiac disease   . H/O parotitis     right   . Polycythemia, secondary   . Transgendered   . Sleep apnea   . Neck pain   . Abnormal weight loss   . Gluten enteropathy   . Syrinx of spinal cord ([place]) 01/06/2014 on MRI    c spine  . Anxiety     PTSD per patient   BP 106/74 mmHg  Pulse 97  SpO2 95%  Opioid Risk Score:   Fall Risk Score:   `1  Depression screen PHQ 2/9  Depression screen [facility] 2/9 06/18/2015 05/24/2015 06/12/2014  Decreased Interest 2 2 2   Down, Depressed, Hopeless 0 0 0  PHQ - 2 Score 2 2 2   Altered sleeping - - 3  Tired, decreased energy - - 3  Change in appetite - - 0  Feeling bad or failure about yourself  - - 0  Trouble concentrating - - 3  Moving slowly or fidgety/restless - - 0  Suicidal thoughts - - 0  PHQ-9 Score - - 11     Review of Systems  Respiratory: Positive for apnea.   All other systems reviewed and are negative.      Objective:   Physical Exam  Constitutional: He is oriented to person, place, and time. He appears well-developed and well-nourished.  HENT:  Head: Normocephalic and atraumatic.  Neck: Normal range of motion. Neck supple.  Cardiovascular: Normal rate and regular rhythm.   Pulmonary/Chest: Effort normal and breath sounds normal.  Musculoskeletal:  Normal Muscle Bulk and Muscle Testing Reveals: Upper Extremities: Full ROM and Muscle Strength 5/5 Bilateral AC Joint Tenderness Lumbar Paraspinal Tenderness: L-3- L-5 Lower Extremities: Full ROM and Muscle Strength 5/5 Left Lower Extremity Flexion Produces Pain into Patella and calf Arrived in wheelchair  Neurological: He is alert and oriented to person, place, and time.  Skin: Skin is warm and dry.  Psychiatric: He has a normal mood and affect.  Nursing note and vitals reviewed.         Assessment & Plan:  1. Psoriatic arthritis with pain in multiple areas, most prominently feet, hands, elbows.: Refilled:Kadian 60 mg 24 hr. Capsule, one capsule every 12 hours #60 and Continue Oxycodone 10 mg one tablet every 8 hours as needed #90.  We will continue the opioid monitoring program, this consists of regular clinic visits, examinations, urine drug screen, pill counts as well as use of [place] Controlled Substance Reporting System.  Rheumatology Following. 2. Prior left sided CVA ('s) most substantial of  which in May 2015 with residual right sided weakness, sensory loss, and expressive language deficits.: Continue Therapy  3. Patello-femoral arthritis left knee: Continue Voltaren Gel S/P TKR on 07/03/14: Ortho Following 4. Chronic mid- low back pain: Continue current medication regime, and encourage to increase activity as tolerated. 5. Polycythemia: Oncology Following.  6. Depression with anxiety : Continue Effexor   20 minutes of face to face patient care time was spent during this visit. All questions were encouraged and answered.

## 2015-09-14 NOTE — Telephone Encounter (Signed)
Placed a call to Jesse Bowers, he states he will be out of his medications prior to his scheduled appointment on 09/26/2015. According to [place] his  Morphine was picked up on 08/08/15 and Oxycodone was picked up on 08/04/15.  We will change his appointment to today at 2:00 he verbalizes understanding.

## 2015-09-18 ENCOUNTER — Ambulatory Visit: Payer: BLUE CROSS/BLUE SHIELD | Admitting: Neurology

## 2015-09-18 ENCOUNTER — Ambulatory Visit (INDEPENDENT_AMBULATORY_CARE_PROVIDER_SITE_OTHER): Payer: BLUE CROSS/BLUE SHIELD | Admitting: Psychology

## 2015-09-18 DIAGNOSIS — F4310 Post-traumatic stress disorder, unspecified: Secondary | ICD-10-CM | POA: Diagnosis not present

## 2015-09-20 HISTORY — PX: NASAL SEPTUM SURGERY: SHX37

## 2015-09-24 ENCOUNTER — Ambulatory Visit (INDEPENDENT_AMBULATORY_CARE_PROVIDER_SITE_OTHER): Payer: BLUE CROSS/BLUE SHIELD | Admitting: Psychology

## 2015-09-24 ENCOUNTER — Telehealth: Payer: Self-pay | Admitting: Physical Medicine & Rehabilitation

## 2015-09-24 DIAGNOSIS — F4310 Post-traumatic stress disorder, unspecified: Secondary | ICD-10-CM | POA: Diagnosis not present

## 2015-09-24 NOTE — Telephone Encounter (Signed)
Patient had nasal surgery last Thursday and had gotten 2 Vicodin and fentanyl.  He would also like to talk to [place] about switching his medications from oral to patch.

## 2015-09-24 NOTE — Telephone Encounter (Signed)
Placed a call to Mr. Bittel, no answer, left message to return the call.

## 2015-09-25 ENCOUNTER — Telehealth: Payer: Self-pay | Admitting: *Deleted

## 2015-09-25 NOTE — Telephone Encounter (Signed)
"  I was told to hold the Xarelto until after surgery.  It's been a week and a half.  I have pink tinged serous sanguinous drainage.  I bleed when I blow my nose to clear the saline moisturizer.  Dr. [name] goes in my nose puling scabs and crud out which cause some bleeding.  I do not know whether to start the Xarelto and when.  Please call [phone]."

## 2015-09-25 NOTE — Telephone Encounter (Signed)
I called pt back, he was cleared by Dr. [name] to restart Xarelto today. But the patient is concerned he may have bleeding if Dr. [name] will remove scabs on his next visit on 7/31 when he is on [place]. I recommend him to restart Xarelto today, and hold on July 31 in case Dr. [name] will pull scabs which may cause bleeding. He voiced good understanding, and agrees with the plan.  [name]  09/25/2015

## 2015-09-26 ENCOUNTER — Telehealth: Payer: Self-pay | Admitting: Physical Medicine & Rehabilitation

## 2015-09-26 ENCOUNTER — Ambulatory Visit: Payer: Self-pay | Admitting: Registered Nurse

## 2015-09-26 NOTE — Telephone Encounter (Signed)
Patient returned Eunice's call.

## 2015-09-27 ENCOUNTER — Ambulatory Visit (INDEPENDENT_AMBULATORY_CARE_PROVIDER_SITE_OTHER): Payer: BLUE CROSS/BLUE SHIELD | Admitting: Psychology

## 2015-09-27 DIAGNOSIS — F4310 Post-traumatic stress disorder, unspecified: Secondary | ICD-10-CM | POA: Diagnosis not present

## 2015-10-01 ENCOUNTER — Ambulatory Visit (INDEPENDENT_AMBULATORY_CARE_PROVIDER_SITE_OTHER): Payer: BLUE CROSS/BLUE SHIELD | Admitting: Psychology

## 2015-10-01 DIAGNOSIS — F4310 Post-traumatic stress disorder, unspecified: Secondary | ICD-10-CM | POA: Diagnosis not present

## 2015-10-02 ENCOUNTER — Encounter: Payer: Self-pay | Admitting: Neurology

## 2015-10-02 ENCOUNTER — Ambulatory Visit (INDEPENDENT_AMBULATORY_CARE_PROVIDER_SITE_OTHER): Payer: BLUE CROSS/BLUE SHIELD | Admitting: Neurology

## 2015-10-02 VITALS — BP 138/88 | HR 88 | Resp 20

## 2015-10-02 DIAGNOSIS — R413 Other amnesia: Secondary | ICD-10-CM | POA: Diagnosis not present

## 2015-10-02 DIAGNOSIS — D6859 Other primary thrombophilia: Secondary | ICD-10-CM

## 2015-10-02 DIAGNOSIS — D751 Secondary polycythemia: Secondary | ICD-10-CM | POA: Diagnosis not present

## 2015-10-02 DIAGNOSIS — Z8673 Personal history of transient ischemic attack (TIA), and cerebral infarction without residual deficits: Secondary | ICD-10-CM | POA: Diagnosis not present

## 2015-10-02 DIAGNOSIS — R200 Anesthesia of skin: Secondary | ICD-10-CM

## 2015-10-02 DIAGNOSIS — G950 Syringomyelia and syringobulbia: Secondary | ICD-10-CM | POA: Diagnosis not present

## 2015-10-02 DIAGNOSIS — F640 Transsexualism: Secondary | ICD-10-CM | POA: Diagnosis not present

## 2015-10-02 DIAGNOSIS — R634 Abnormal weight loss: Secondary | ICD-10-CM

## 2015-10-02 DIAGNOSIS — R5382 Chronic fatigue, unspecified: Secondary | ICD-10-CM | POA: Diagnosis not present

## 2015-10-02 DIAGNOSIS — R0681 Apnea, not elsewhere classified: Secondary | ICD-10-CM

## 2015-10-02 DIAGNOSIS — G4733 Obstructive sleep apnea (adult) (pediatric): Secondary | ICD-10-CM

## 2015-10-02 DIAGNOSIS — Z789 Other specified health status: Secondary | ICD-10-CM

## 2015-10-02 NOTE — Progress Notes (Addendum)
[facility]  PATIENT: Jesse Bowers DOB: November 02, 1965     HISTORICAL  CHIEF COMPLAINT:  Chief Complaint  Patient presents with  . Follow-up    cpap going well, wants a bigger mask for his cpap, has had a septoplasty which is healing well, using AHC  . sleep study    wants to discuss having a new sleep study, thinks he may have sleep apnea during the day  . neuropsych eval    had dr. [name] test him but finished testing at his PMR doctor, see Dr. Monico Hoar notes    HISTORY OF PRESENT ILLNESS:  Jesse Bowers is a 50 year old transgender male with multiple complaints including numbness, olfactory hallucinations and memory loss.   Syrinx/numbness:   He reports tingling in his hands, left more than right.  Also has numbness in a acapelike distribution of the back.   Laying flat increases hand numbness.    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   He reports a lot of lot of numbness in the back and arms. Ortho has told him he has subluxation at the elbow affecting his left ulnar nerve.     He also states he has woken up with severe arm weakness some mornings.    He reports a lot of pain as well as the numbness, mostly in the back.  Memory/Psych:   He reports short term memory issues.       He notes a lot of difficulty focusing.   Dr [name] evaluated (report 01/05/15 in EPIC was reviewed).  His summary reports decreased mental processing speed, verbal fluency, mental flexibility and bimanual motor speed     [facility] for [facility] and anxiety.     Night time cyclobenzapine has helped his sleep and mood related issues.    OSA:  He has OSA with central apneas and is now on ASV mode (EPAP 7 cm, PS 4-15) and has trouble wearing the mask the entire night.  He was on BiPAP but still had OSA.   Mask does not leak too much and is better than prior mask.      I reviewed his download. It shows use most days though he did not use it 1 week during his sinus surgery. Efficacy was excellent  with an AHI equals 1.8.  Olfactory Hallucinations:   These are better, none recently.    He feels better since sinus surgery  Chronic pain:   He sees Dr. Ephriam Knuckles (PMR).   He has chronic pain helped a lot by a numbing ointment and Morphine.    He also reports his right foot turns in as he walks and PMR is setting him up for an AFO.    He is on Cosyntex for psoriatic arthritis.   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    His MRI brain 2015 showed mild predominantly subcortical hyperintense foci that are non-specific.  A relationship to the Prt C deficiency is possible.     EPWORTH SLEEPINESS SCALE  On a scale of 0 - 3 what is the chance of dozing:  Sitting and Reading:   2 Watching TV:    3 Sitting inactive in a public place: 0 Passenger in car for one hour: 3 Lying down to rest in the afternoon: 3 Sitting and talking to someone: 0 Sitting quietly after lunch:  1 In  a car, stopped in traffic:  0  Total (out of 24):    12   (mild to moderate EDS)   REVIEW OF SYSTEMS:  Constitutional: Has fatigue.  See above.   No fevers, chills, sweats, or change in appetite Eyes: Occ episodes of blurred vision.   No eye pain Ear, nose and throat: No hearing loss, ear pain, nasal congestion, sore throat Cardiovascular: No chest pain.   He notes occ. palpitations Respiratory:  No shortness of breath at rest or with exertion.   No wheezes GastrointestinaI: No nausea, vomiting, diarrhea, abdominal pain, fecal incontinence Genitourinary:  No dysuria, urinary retention or frequency.  No nocturia. Musculoskeletal:  He has psoriatic arthritis with joint pain/swelling. Integumentary:has psoriasis Neurological: as above Psychiatric: Feel depression and anxiety are doing well.   H/O PTSD Endocrine: He is transgender and on chronic testosterone therapy.   Reports heat intolerance.  No palpitations, diaphoresis, change in  appetite, change in weigh or increased thirst Hematologic/Lymphatic:  No anemia, purpura, petechiae. Allergic/Immunologic: No itchy/runny eyes, nasal congestion, recent allergic reactions, rashes  ALLERGIES: Allergies  Allergen Reactions  . Penicillin G Anaphylaxis  . Vancomycin Rash  . Wheat Extract Other (See Comments)    CELIAC DISEASE  . Duloxetine     Restless legs  . Gabapentin Nausea Only  . Sulfa Antibiotics Rash    Stevens-Johnson rash    HOME MEDICATIONS: Outpatient Medications Prior to Visit  Medication Sig Dispense Refill  . Calcipotriene-Betameth Diprop (ENSTILAR) 0.005-0.064 % FOAM Apply 1 application topically at bedtime.    . clindamycin (CLEOCIN) 150 MG capsule Take 450 mg by mouth. Reported on 09/14/2015    . clobetasol ointment (TEMOVATE) 2.97 % Apply 1 application topically 2 (two) times daily as needed (psoriasis).     . clorazepate (TRANXENE) 7.5 MG tablet Take one tablet by mouth three times daily (Patient taking differently: Take 7.5-15 mg by mouth 3 (three) times daily. Takes one in the morning, one around 3pm and two tablets at bedtime.) 90 tablet 0  . cyclobenzaprine (FLEXERIL) 5 MG tablet Take 1 tablet (5 mg total) by mouth at bedtime. 30 tablet 11  . desonide (DESOWEN) 0.05 % ointment APPLY TO AFFECTED AREAS TWICE DAILY AS NEEDED FOR PSORIASIS.  2  . diclofenac sodium (VOLTAREN) 1 % GEL Apply 1 application topically 3 (three) times daily. To feet, hands, knees, elbows (Patient taking differently: Apply 1 application topically as needed. To feet, hands, knees, elbows) 3 Tube 4  . morphine (KADIAN) 60 MG 24 hr capsule Take 1 capsule (60 mg total) by mouth every 12 (twelve) hours. 60 capsule 0  . NONFORMULARY OR COMPOUNDED ITEM Apply 1 application topically 3 (three) times daily. 8% ketamine, 5% amitriptyline, 5% baclofen, 5% gabapentin  60 GM (Patient taking differently: Apply 1 application topically 3 (three) times daily as needed (pain.). 8% ketamine, 5%  amitriptyline, 5% baclofen, 5% gabapentin  60 GM) 1 each 5  . Oxycodone HCl 10 MG TABS Take 1 tablet (10 mg total) by mouth every 8 (eight) hours as needed. 90 tablet 0  . polyethylene glycol (MIRALAX / GLYCOLAX) packet Take 17 g by mouth daily as needed for moderate constipation. Reported on 08/22/2015    . prazosin (MINIPRESS) 2 MG capsule Take 6-8 mg by mouth at bedtime.     . Secukinumab 150 MG/ML SOAJ Inject 2 each into the skin every 28 (twenty-eight) days. Reported on 08/17/2015    . testosterone cypionate (DEPO-TESTOSTERONE) 200 MG/ML injection Inject 0.25 mLs (50 mg  total) into the muscle once a week. 10 mL 0  . XARELTO 20 MG TABS tablet TAKE 1 TABLET (20 MG TOTAL) BY MOUTH DAILY WITH SUPPER. 30 tablet 5  . venlafaxine (EFFEXOR) 75 MG tablet Take 225 mg by mouth every morning.      No facility-administered medications prior to visit.     PAST MEDICAL HISTORY: Past Medical History:  Diagnosis Date  . Abnormal weight loss   . Anxiety    PTSD per patient  . Celiac disease   . Clotting disorder ([place])   . Gluten enteropathy   . H/O parotitis    right   . H/O protein C deficiency   . Hx-TIA (transient ischemic attack)   . Neck pain   . Polycythemia, secondary   . psoriatic arthritis   . Sleep apnea   . Syrinx of spinal cord ([place]) 01/06/2014 on MRI   c spine  . Transgendered     PAST SURGICAL HISTORY: Past Surgical History:  Procedure Laterality Date  . ANKLE ARTHROSCOPY WITH RECONSTRUCTION Right 2007  . CHOLECYSTECTOMY    . HIP ARTHROSCOPY W/ LABRAL REPAIR Right 05/11/2013  . KNEE SURGERY Bilateral 1984   Right ACL, left PCL repair  . LIVER BIOPSY  2013   normal results.  . THYROIDECTOMY, PARTIAL  2008    FAMILY HISTORY: Family History  Problem Relation Age of Onset  . Stroke Paternal Uncle     age 4  . Stroke Maternal Grandfather     68  . Hypertension Mother   . Psoriasis Mother   . Stroke Maternal Grandmother   . Congestive Heart Failure Maternal  Grandmother   . Cancer Paternal Grandfather   . Protein C deficiency Sister 62    Miscarriages    SOCIAL HISTORY:  Social History   Social History  . Marital status: Married    Spouse name: N/A  . Number of children: 2  . 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 Smoking status: Never Smoker  . Smokeless tobacco: Not on file  . Alcohol use Yes     Comment: wine once a month  . Drug use: No  . Sexual activity: Yes    Birth control/ protection: None     Comment: patient is a transgender on testosterone shots, no biological kids   Other Topics Concern  . 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 Relocated to [place] about 2 months ago from [place], [place]. Patient was in [place] for last 4 years and prior to that in [place]. His wife is working as Scientist, research (physical sciences) for [employer]. Patient is not working and applying for disability. They have 2 kids but no biologic children.      PHYSICAL EXAM  Vitals:   10/02/15 1429  BP: 138/88  Pulse: 88  Resp: 20    There is no height or weight on file to calculate BMI.   General: The patient is well-developed and well-nourished and in no acute distress   Throat:   No erythema.  No elongation of palate  Neck: The neck is supple, no carotid bruits are noted.  The neck is nontender.   Cardiovascular: The cardiovascular examination reveals a regular rate and rhythm, no  murmurs, gallops or rubs are noted.  Skin: Extremities are without significant edema or rash.   Has left TKR scar  Neurologic Exam  Mental status: The patient is alert and oriented x 3 at the time of the examination. He answers questions appropriately.   The patient has apparent normal recent and remote memory, with an apparently  normal attention span and concentration ability.   Speech is normal.  Cranial nerves: Extraocular movements are full.   Facial symmetry is present.He reports decreased right facial sensation.   Facial strength is normal.  Trapezius and sternocleidomastoid strength is normal. No dysarthria is noted.  The tongue is midline, and the patient has symmetric elevation of the soft palate. No obvious hearing deficits are noted.  Motor:  Muscle bulk and tone are normal. Strength is  5 / 5 in all 4 extremities.   Sensory: He reports reduced sensory to touch and vibration in the hands, worse on right and in upper back centrally.   .  Coordination: Cerebellar testing reveals good finger-nose-finger bilaterally.  Gait and station: Station is normal and feet are inverted as he walks, mild right steppage gait mildly arthritic (recent surgery).      Tandem gait is wide. Romberg is negative.   Reflexes: Deep tendon reflexes are symmetric and normal bilaterally.    DIAGNOSTIC DATA (LABS, IMAGING, TESTING) - I reviewed patient records, labs, notes, testing and imaging myself where available.  Lab Results  Component Value Date   WBC 3.6 (L) 08/22/2015   HGB 13.9 08/22/2015   HCT 42.8 08/22/2015   MCV 86.5 08/22/2015   PLT 150 08/22/2015    Lab Results  Component Value Date   VITAMINB12 320 12/26/2013   Lab Results  Component Value Date   TSH 0.778 01/24/2014        ASSESSMENT AND PLAN  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[place]) - Plan: MR Cervical Spine Wo Contrast  OSA (obstructive sleep apnea)  Protein C deficiency ([facility])  Weight loss, unintentional  Polycythemia, secondary  H/O TIA (transient ischemic attack) and stroke  Numbness - Plan: MR Brain Wo Contrast, MR Cervical Spine Wo Contrast  Chronic fatigue  Transgender  Memory loss - Plan: MR Brain Wo Contrast  Central apnea  1.   Continue ASV .     New FF mask to see if can get better fit. 2.   Continue cyclobenzaprine for insomnia and  PTSD 3.   He is reporting more memory concerns.  Check MRI of the brain to make sure there has not been progression of the chronic microvascular ischemic changes or other issue. 4.   He will return to see me in 6 months or sooner if he has new or worsening neurologic symptoms.  44 minutes face-to-face with greater than 50% of the time counseling correlating care about his multiple neurologic symptoms.   Richard A. [name], MD, PhD 04/11/9369, 6:96 PM Certified in Neurology, Clinical Neurophysiology, Sleep Medicine, Pain Medicine and Neuroimaging  [facility] [address] [place], [place] [address] [phone]

## 2015-10-03 ENCOUNTER — Telehealth: Payer: Self-pay | Admitting: Registered Nurse

## 2015-10-03 NOTE — Telephone Encounter (Signed)
Return Mr. Severa call no answer.

## 2015-10-04 ENCOUNTER — Encounter: Payer: Self-pay | Admitting: Interventional Cardiology

## 2015-10-04 ENCOUNTER — Telehealth: Payer: Self-pay | Admitting: *Deleted

## 2015-10-04 ENCOUNTER — Ambulatory Visit (INDEPENDENT_AMBULATORY_CARE_PROVIDER_SITE_OTHER): Payer: BLUE CROSS/BLUE SHIELD | Admitting: Psychology

## 2015-10-04 ENCOUNTER — Ambulatory Visit (INDEPENDENT_AMBULATORY_CARE_PROVIDER_SITE_OTHER): Payer: BLUE CROSS/BLUE SHIELD | Admitting: Interventional Cardiology

## 2015-10-04 VITALS — BP 120/80 | HR 90 | Ht 68.0 in | Wt 221.0 lb

## 2015-10-04 DIAGNOSIS — R002 Palpitations: Secondary | ICD-10-CM | POA: Diagnosis not present

## 2015-10-04 DIAGNOSIS — R0681 Apnea, not elsewhere classified: Secondary | ICD-10-CM

## 2015-10-04 DIAGNOSIS — F4310 Post-traumatic stress disorder, unspecified: Secondary | ICD-10-CM | POA: Diagnosis not present

## 2015-10-04 DIAGNOSIS — Z862 Personal history of diseases of the blood and blood-forming organs and certain disorders involving the immune mechanism: Secondary | ICD-10-CM | POA: Diagnosis not present

## 2015-10-04 NOTE — Telephone Encounter (Signed)
Late Entry:  Faxed clearance for surgery with recommendations to hold xarelto 2 days before surgery & restart in 3-5 days as soon as it's safe from surgical standpoint to [facility] [place] @ [phone].

## 2015-10-04 NOTE — Progress Notes (Signed)
Cardiology Office Note   Date:  10/04/2015   ID:  Jesse Bowers, DOB 04/10/1965, MRN [id number]  PCP:  Concepcion Elk, MD    No chief complaint on file. palpitations   Wt Readings from Last 3 Encounters:  10/04/15 221 lb (100.2 kg)  08/22/15 225 lb 12.8 oz (102.4 kg)  08/14/15 226 lb 14.4 oz (102.9 kg)       History of Present Illness: Jesse Bowers is a 50 y.o. male  Who is on Xarelto for prior CVA.  He has had palpitations where his HR will be in the 160-200 range.  He feels diaphoretic.  HR will behigh when at rest.  He will get flushed.  He gets thirsty and blurry vision.  He has had panic attacks in the past but none in a few months.  Palpitations are 1-2x/week.  They can wake him from sleep.  He has had lightheadedness as well.   He was a Designer, jewellery.    All 4 grandparents are deceased from cardiovascular disease.  Parents are alive.  Sisters are fine.      Past Medical History:  Diagnosis Date  . Abnormal weight loss   . Anxiety    PTSD per patient  . Celiac disease   . Clotting disorder ([place])   . Gluten enteropathy   . H/O parotitis    right   . H/O protein C deficiency   . Hx-TIA (transient ischemic attack)   . Neck pain   . Polycythemia, secondary   . psoriatic arthritis   . Sleep apnea   . Syrinx of spinal cord ([place]) 01/06/2014 on MRI   c spine  . Transgendered     Past Surgical History:  Procedure Laterality Date  . ANKLE ARTHROSCOPY WITH RECONSTRUCTION Right 2007  . CHOLECYSTECTOMY    . HIP ARTHROSCOPY W/ LABRAL REPAIR Right 05/11/2013  . KNEE SURGERY Bilateral 1984   Right ACL, left PCL repair  . LIVER BIOPSY  2013   normal results.  . THYROIDECTOMY, PARTIAL  2008     Current Outpatient Prescriptions  Medication Sig Dispense Refill  . Calcipotriene-Betameth Diprop (ENSTILAR) 0.005-0.064 % FOAM Apply 1 application topically at bedtime.    . clindamycin (CLEOCIN) 150 MG capsule Take 450 mg by mouth as needed (DENTAL APPT). Reported on  09/14/2015    . clobetasol ointment (TEMOVATE) 2.56 % Apply 1 application topically 2 (two) times daily as needed (psoriasis).     . clorazepate (TRANXENE) 7.5 MG tablet Take by mouth daily. TAKE 4 TABLETS BY MOUTH DAILY    . cyclobenzaprine (FLEXERIL) 5 MG tablet Take 5 mg by mouth daily.    [name] desonide (DESOWEN) 0.05 % ointment APPLY TO AFFECTED AREAS TWICE DAILY AS NEEDED FOR PSORIASIS.  2  . diclofenac sodium (VOLTAREN) 1 % GEL Apply 2 g topically 3 (three) times daily.    [name] morphine (KADIAN) 60 MG 24 hr capsule Take 1 capsule (60 mg total) by mouth every 12 (twelve) hours. 60 capsule 0  . NONFORMULARY OR COMPOUNDED ITEM Apply 1 application topically 3 (three) times daily. 8% ketamine, 5% amitriptyline, 5% baclofen, 5% gabapentin  60 GM (Patient taking differently: Apply 1 application topically. 8% ketamine, 5% amitriptyline, 5% baclofen, 5% gabapentin  60 GM) 1 each 5  . Oxycodone HCl 10 MG TABS Take 1 tablet (10 mg total) by mouth every 8 (eight) hours as needed. 90 tablet 0  . polyethylene glycol (MIRALAX / GLYCOLAX) packet Take 17 g by mouth daily as  needed for moderate constipation. Reported on 08/22/2015    . prazosin (MINIPRESS) 2 MG capsule Take 6-8 mg by mouth at bedtime.     . prazosin (MINIPRESS) 5 MG capsule Take 5 mg by mouth at bedtime.    . Secukinumab 150 MG/ML SOAJ Inject 2 each into the skin every 28 (twenty-eight) days. Reported on 08/17/2015    . testosterone cypionate (DEPO-TESTOSTERONE) 200 MG/ML injection Inject 0.25 mLs (50 mg total) into the muscle once a week. 10 mL 0  . venlafaxine XR (EFFEXOR-XR) 75 MG 24 hr capsule Take 350 mg by mouth daily with breakfast.     . XARELTO 20 MG TABS tablet TAKE 1 TABLET (20 MG TOTAL) BY MOUTH DAILY WITH SUPPER. 30 tablet 5   No current facility-administered medications for this visit.     Allergies:   Penicillin g; Vancomycin; Wheat extract; Duloxetine; Gabapentin; and Sulfa antibiotics    Social History:  The patient  reports  that he has never smoked. He does not have any smokeless tobacco history on file. He reports that he drinks alcohol. He reports that he does not use drugs.   Family History:  The patient's family history includes Cancer in his paternal grandfather; Congestive Heart Failure in his maternal grandmother; Heart attack in his maternal grandfather, maternal grandmother, and paternal grandfather; Hypertension in his mother; Protein C deficiency (age of onset: 110) in his sister; Psoriasis in his mother; Stroke in his maternal grandfather, maternal grandmother, and paternal uncle.    ROS:  Please see the history of present illness.   Otherwise, review of systems are positive for .   All other systems are reviewed and negative.    PHYSICAL EXAM: VS:  BP 120/80   Pulse 90   Ht 5' 8"  (1.727 m)   Wt 221 lb (100.2 kg)   BMI 33.60 kg/m  , BMI Body mass index is 33.6 kg/m. GEN: Well nourished, well developed, in no acute distress  HEENT: normal  Neck: no JVD, carotid bruits, or masses Cardiac: RRR; no murmurs, rubs, or gallops,no edema  Respiratory:  clear to auscultation bilaterally, normal work of breathing GI: soft, nontender, nondistended, + BS MS: no deformity or atrophy  Skin: warm and dry, no rash Neuro:  Strength and sensation are intact Psych: euthymic mood, full affect   EKG:   The ekg ordered today demonstrates normal sinus rhythm, poor R-wave progression, nonspecific anterior ST segment changes   Recent Labs: 08/22/2015: ALT 49; BUN 10.5; Creatinine 1.3; HGB 13.9; Platelets 150; Potassium 3.8; Sodium 140   Lipid Panel No results found for: CHOL, TRIG, HDL, CHOLHDL, VLDL, LDLCALC, LDLDIRECT   Other studies Reviewed: Additional studies/ records that were reviewed today with results demonstrating: *We'll try to obtain records from his prior cardiac workup. He had a stress test and monitor in [place]..   ASSESSMENT AND PLAN:  1. Palpitations: No syncope. Plan for 30 day event  monitor. He may have some type of SVT with a heart rate in the 160-200 range.  No signs of heart failure. We'll try to obtain prior echo result. 2. Continue Xarelto for prior TIA due to hypercoagulable state. Protein C defic. 3. OSA: using CPAP.    Current medicines are reviewed at length with the patient today.  The patient concerns regarding his medicines were addressed.  The following changes have been made:  No change  Labs/ tests ordered today include: 30 day event monitor  No orders of the defined types were placed in this  encounter.   Recommend 150 minutes/week of aerobic exercise Low fat, low carb, high fiber diet recommended  Disposition:   FU in when necessary, closer follow-up if there is actually an arrhythmia on his monitor   Signed, Larae Grooms, MD  10/04/2015 2:54 PM    [facility] [place], [place], [place]  [address] Phone: [phone]; Fax: [phone]

## 2015-10-04 NOTE — Patient Instructions (Signed)
**Note De-Identified  Obfuscation** Medication Instructions:  Same-no changes  Labwork: None  Testing/Procedures: Your physician has recommended that you wear an event monitor for 30 days. Event monitors are medical devices that record the heart's electrical activity. Doctors most often [place] these monitors to diagnose arrhythmias. Arrhythmias are problems with the speed or rhythm of the heartbeat. The monitor is a small, portable device. You can wear one while you do your normal daily activities. This is usually used to diagnose what is causing palpitations/syncope (passing out).    Follow-Up: As needed    If you need a refill on your cardiac medications before your next appointment, please call your pharmacy.

## 2015-10-05 ENCOUNTER — Telehealth: Payer: Self-pay | Admitting: Interventional Cardiology

## 2015-10-05 NOTE — Telephone Encounter (Signed)
Records received from L[facility] placed in chart prep room.

## 2015-10-07 ENCOUNTER — Other Ambulatory Visit: Payer: Self-pay | Admitting: Endocrinology

## 2015-10-07 ENCOUNTER — Encounter: Payer: Self-pay | Admitting: Endocrinology

## 2015-10-08 ENCOUNTER — Other Ambulatory Visit: Payer: Self-pay

## 2015-10-08 ENCOUNTER — Ambulatory Visit (INDEPENDENT_AMBULATORY_CARE_PROVIDER_SITE_OTHER): Payer: BLUE CROSS/BLUE SHIELD | Admitting: Psychology

## 2015-10-08 DIAGNOSIS — F331 Major depressive disorder, recurrent, moderate: Secondary | ICD-10-CM | POA: Diagnosis not present

## 2015-10-08 DIAGNOSIS — F4310 Post-traumatic stress disorder, unspecified: Secondary | ICD-10-CM

## 2015-10-10 ENCOUNTER — Encounter: Payer: BLUE CROSS/BLUE SHIELD | Attending: Physical Medicine & Rehabilitation | Admitting: Registered Nurse

## 2015-10-10 ENCOUNTER — Encounter: Payer: Self-pay | Admitting: Registered Nurse

## 2015-10-10 VITALS — BP 124/87 | HR 88

## 2015-10-10 DIAGNOSIS — D751 Secondary polycythemia: Secondary | ICD-10-CM | POA: Insufficient documentation

## 2015-10-10 DIAGNOSIS — R209 Unspecified disturbances of skin sensation: Secondary | ICD-10-CM | POA: Diagnosis not present

## 2015-10-10 DIAGNOSIS — D6859 Other primary thrombophilia: Secondary | ICD-10-CM | POA: Insufficient documentation

## 2015-10-10 DIAGNOSIS — Z86718 Personal history of other venous thrombosis and embolism: Secondary | ICD-10-CM | POA: Diagnosis not present

## 2015-10-10 DIAGNOSIS — G894 Chronic pain syndrome: Secondary | ICD-10-CM

## 2015-10-10 DIAGNOSIS — Z87890 Personal history of sex reassignment: Secondary | ICD-10-CM | POA: Diagnosis not present

## 2015-10-10 DIAGNOSIS — M13862 Other specified arthritis, left knee: Secondary | ICD-10-CM | POA: Diagnosis not present

## 2015-10-10 DIAGNOSIS — Z79899 Other long term (current) drug therapy: Secondary | ICD-10-CM | POA: Diagnosis not present

## 2015-10-10 DIAGNOSIS — Z79891 Long term (current) use of opiate analgesic: Secondary | ICD-10-CM | POA: Diagnosis not present

## 2015-10-10 DIAGNOSIS — K900 Celiac disease: Secondary | ICD-10-CM | POA: Insufficient documentation

## 2015-10-10 DIAGNOSIS — M545 Low back pain, unspecified: Secondary | ICD-10-CM

## 2015-10-10 DIAGNOSIS — I69351 Hemiplegia and hemiparesis following cerebral infarction affecting right dominant side: Secondary | ICD-10-CM | POA: Diagnosis not present

## 2015-10-10 DIAGNOSIS — Z7901 Long term (current) use of anticoagulants: Secondary | ICD-10-CM | POA: Diagnosis not present

## 2015-10-10 DIAGNOSIS — I69320 Aphasia following cerebral infarction: Secondary | ICD-10-CM | POA: Diagnosis not present

## 2015-10-10 DIAGNOSIS — Z9181 History of falling: Secondary | ICD-10-CM | POA: Diagnosis not present

## 2015-10-10 DIAGNOSIS — Z5181 Encounter for therapeutic drug level monitoring: Secondary | ICD-10-CM

## 2015-10-10 DIAGNOSIS — S43002A Unspecified subluxation of left shoulder joint, initial encounter: Secondary | ICD-10-CM | POA: Diagnosis not present

## 2015-10-10 DIAGNOSIS — G8929 Other chronic pain: Secondary | ICD-10-CM | POA: Diagnosis present

## 2015-10-10 DIAGNOSIS — M7712 Lateral epicondylitis, left elbow: Secondary | ICD-10-CM

## 2015-10-10 DIAGNOSIS — F418 Other specified anxiety disorders: Secondary | ICD-10-CM | POA: Insufficient documentation

## 2015-10-10 DIAGNOSIS — I69398 Other sequelae of cerebral infarction: Secondary | ICD-10-CM | POA: Insufficient documentation

## 2015-10-10 DIAGNOSIS — M7522 Bicipital tendinitis, left shoulder: Secondary | ICD-10-CM | POA: Insufficient documentation

## 2015-10-10 DIAGNOSIS — M546 Pain in thoracic spine: Secondary | ICD-10-CM | POA: Diagnosis not present

## 2015-10-10 DIAGNOSIS — Z96652 Presence of left artificial knee joint: Secondary | ICD-10-CM | POA: Diagnosis not present

## 2015-10-10 DIAGNOSIS — L4050 Arthropathic psoriasis, unspecified: Secondary | ICD-10-CM | POA: Diagnosis not present

## 2015-10-10 MED ORDER — MORPHINE SULFATE ER 60 MG PO CP24: 60.0000 mg | ORAL_CAPSULE | Freq: Two times a day (BID) | ORAL | 0 refills | Status: DC

## 2015-10-10 MED ORDER — OXYCODONE HCL 10 MG PO TABS: 10.0000 mg | ORAL_TABLET | Freq: Three times a day (TID) | ORAL | 0 refills | Status: DC | PRN

## 2015-10-10 NOTE — Progress Notes (Signed)
Subjective:    Patient ID: Jesse Bowers, male    DOB: May 06, 1965, 50 y.o.   MRN: [id number]  HPI: [name] is a 50 year old male who returns for follow-up for chronic pain and medication refill. He states his pain is located in his left shoulder,left elbow, lower-back radiating into her right hip and left ankle  He rates his pain 9. His current exercise regime is performing Yoga and walking with his walker short distances.  Also states he had nasal surgery on 09/20/2015 with Dr. Jacinto Bowers.  Dr. Caralyn Bowers orthopaedist following his left lateral epicondylitis, wearing compression sleeve. He's scheduled for left Total Knee Revision with Dr. Wynelle Bowers on 02/06/2016.  Pain Inventory Average Pain 9 Pain Right Now 9 My pain is constant, sharp, dull, stabbing and tingling  In the last 24 hours, has pain interfered with the following? General activity 9 Relation with others 10 Enjoyment of life 9 What TIME of day is your pain at its worst? all Sleep (in general) Poor  Pain is worse with: bending, sitting and some activites Pain improves with: rest, heat/ice, medication, TENS and injections Relief from Meds: 8  Mobility walk with assistance how many minutes can you walk? 10 ability to climb steps?  yes do you drive?  no use a wheelchair transfers alone Do you have any goals in this area?  no  Function disabled: date disabled . I need assistance with the following:  feeding, dressing, bathing, meal prep, household duties and shopping Do you have any goals in this area?  no  Neuro/Psych bladder control problems weakness numbness tremor tingling trouble walking anxiety  Prior Studies Any changes since last visit?  yes  Physicians involved in your care Any changes since last visit?  no   Family History  Problem Relation Age of Onset  . Stroke Maternal Grandfather     55  . Heart attack Maternal Grandfather   . Hypertension Mother   . Psoriasis Mother   . Cancer  Paternal Grandfather   . Heart attack Paternal Grandfather   . Stroke Paternal Uncle     age 38  . Stroke Maternal Grandmother   . Congestive Heart Failure Maternal Grandmother   . Heart attack Maternal Grandmother   . Protein C deficiency Sister 63    Miscarriages   Social History   Social History  . Marital status: Married    Spouse name: N/A  . Number of children: 2  . 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 Smoking status: Never Smoker  . Smokeless tobacco: Never Used  . Alcohol use Yes     Comment: wine once a month  . Drug use: No  . Sexual activity: Yes    Birth control/ protection: None     Comment: patient is a transgender on testosterone shots, no biological kids   Other Topics Concern  . 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 Relocated to [place] about 2 months ago from [place], [place]. Patient was in [place] for last 4 years and prior to that in [place]. His wife is working as Scientist, research (physical sciences) for [employer]. Patient is not working and applying for disability. They have 2 kids but no biologic children.  Past Surgical History:  Procedure Laterality Date  . ANKLE ARTHROSCOPY WITH RECONSTRUCTION Right 2007  . CHOLECYSTECTOMY    . HIP ARTHROSCOPY W/ LABRAL REPAIR Right 05/11/2013  . KNEE SURGERY Bilateral 1984   Right ACL, left PCL repair  . LIVER BIOPSY  2013   normal results.  [name] NASAL SEPTUM SURGERY N/A 09/20/2015  . THYROIDECTOMY, PARTIAL  2008   Past Medical History:  Diagnosis Date  . Abnormal weight loss   . Anxiety    PTSD per patient  . Celiac disease   . Clotting disorder ([place])   . Gluten enteropathy   . H/O parotitis    right   . H/O protein C deficiency   . Hx-TIA (transient ischemic attack)   . Neck pain     . Polycythemia, secondary   . psoriatic arthritis   . Sleep apnea   . Syrinx of spinal cord ([place]) 01/06/2014 on MRI   c spine  . Transgendered    BP 124/87   Pulse 88   SpO2 95%   Opioid Risk Score:   Fall Risk Score:  `1  Depression screen PHQ 2/9  Depression screen [facility] 2/9 06/18/2015 05/24/2015 06/12/2014  Decreased Interest 2 2 2   Down, Depressed, Hopeless 0 0 0  PHQ - 2 Score 2 2 2   Altered sleeping - - 3  Tired, decreased energy - - 3  Change in appetite - - 0  Feeling bad or failure about yourself  - - 0  Trouble concentrating - - 3  Moving slowly or fidgety/restless - - 0  Suicidal thoughts - - 0  PHQ-9 Score - - 11  Some recent data might be hidden    Review of Systems  HENT: Negative.   Eyes: Negative.   Respiratory: Negative.   Cardiovascular: Negative.   Gastrointestinal: Negative.   Endocrine: Negative.   Genitourinary: Negative.   Musculoskeletal: Positive for back pain.  Skin: Negative.   Allergic/Immunologic: Negative.   Neurological: Positive for tremors, weakness and numbness.  Hematological: Negative.   Psychiatric/Behavioral: Negative.        Objective:   Physical Exam  Constitutional: He is oriented to person, place, and time. He appears well-developed and well-nourished.  HENT:  Head: Normocephalic and atraumatic.  Neck: Normal range of motion. Neck supple.  Cardiovascular: Normal rate and regular rhythm.   Pulmonary/Chest: Effort normal and breath sounds normal.  Musculoskeletal:  Normal Muscle Bulk and Muscle Testing Reveals: Upper Extremities: Full ROM and Muscle Strength 5/5 Wearing Left Elbow Compression Sleeve Thoracic Paraspinal Tenderness: T-1-T-2  T-7- T-9 Lower Extremities: Full ROM and Muscle Strength 5/5 Left Lower Extremity Flexion Produces Pain into Patella Arrived  In wheelchair    Neurological: He is alert and oriented to person, place, and time.  Skin: Skin is warm and dry.  Psychiatric: He has a normal mood and  affect.  Nursing note and vitals reviewed.         Assessment & Plan:  1. Psoriatic arthritis with pain in multiple areas, most prominently feet, hands, elbows.: Refilled:Kadian 60 mg 24 hr. Capsule, one capsule every 12 hours #60 and Continue Oxycodone 10 mg one tablet every 8 hours as needed #90.  We will continue the opioid monitoring program, this consists of regular clinic visits, examinations, urine drug screen, pill counts as well as use of [place] Controlled Substance Reporting System.  Rheumatology Following. 2. Prior left sided CVA ('s) most substantial of which in May 2015 with residual right sided weakness, sensory  loss, and expressive language deficits.: Continue Therapy  3. Patello-femoral arthritis left knee: Continue Voltaren Gel S/P TKR on 07/03/14: Ortho Following: Dr. Wynelle Bowers will perform Total Left Knee Revision 4. Chronic mid- low back pain: Continue current medication regime, and encourage to increase activity as tolerated. 5. Polycythemia: Oncology Following.  6. Depression with anxiety : Continue Effexor  7. Muscle Spasm: Continue Flexeril  20 minutes of face to face patient care time was spent during this visit. All questions were encouraged and answered.

## 2015-10-11 ENCOUNTER — Encounter: Payer: Self-pay | Admitting: Neurology

## 2015-10-11 ENCOUNTER — Ambulatory Visit (INDEPENDENT_AMBULATORY_CARE_PROVIDER_SITE_OTHER): Payer: BLUE CROSS/BLUE SHIELD | Admitting: Psychology

## 2015-10-11 DIAGNOSIS — F4310 Post-traumatic stress disorder, unspecified: Secondary | ICD-10-CM

## 2015-10-12 ENCOUNTER — Telehealth: Payer: Self-pay | Admitting: Neurology

## 2015-10-12 NOTE — Telephone Encounter (Signed)
Called patient to give him an update on MRI's (one is still pending a peer to peer) patient did not answer so I left a VM.

## 2015-10-15 ENCOUNTER — Other Ambulatory Visit: Payer: Self-pay | Admitting: Endocrinology

## 2015-10-15 DIAGNOSIS — F640 Transsexualism: Secondary | ICD-10-CM

## 2015-10-15 DIAGNOSIS — Z789 Other specified health status: Secondary | ICD-10-CM

## 2015-10-16 ENCOUNTER — Telehealth: Payer: Self-pay | Admitting: Neurology

## 2015-10-16 ENCOUNTER — Ambulatory Visit: Payer: Self-pay | Admitting: Endocrinology

## 2015-10-16 ENCOUNTER — Telehealth: Payer: Self-pay | Admitting: Physical Medicine & Rehabilitation

## 2015-10-16 NOTE — Telephone Encounter (Signed)
Patient wanted to let Jesse Bowers know that change of medication seems to be working well for him (oxycodone 3xday).  No need for patch right now.

## 2015-10-16 NOTE — Telephone Encounter (Signed)
Pt called to check on status of scheduling MRI. Please call

## 2015-10-22 ENCOUNTER — Encounter: Payer: Self-pay | Admitting: Physical Medicine & Rehabilitation

## 2015-10-22 ENCOUNTER — Encounter (HOSPITAL_BASED_OUTPATIENT_CLINIC_OR_DEPARTMENT_OTHER): Payer: BLUE CROSS/BLUE SHIELD | Admitting: Physical Medicine & Rehabilitation

## 2015-10-22 VITALS — BP 120/81 | HR 86

## 2015-10-22 DIAGNOSIS — L4050 Arthropathic psoriasis, unspecified: Secondary | ICD-10-CM | POA: Diagnosis not present

## 2015-10-22 DIAGNOSIS — Z96652 Presence of left artificial knee joint: Secondary | ICD-10-CM

## 2015-10-22 DIAGNOSIS — M7522 Bicipital tendinitis, left shoulder: Secondary | ICD-10-CM

## 2015-10-22 MED ORDER — OXYCODONE HCL 10 MG PO TABS: 10.0000 mg | ORAL_TABLET | Freq: Three times a day (TID) | ORAL | 0 refills | Status: DC | PRN

## 2015-10-22 MED ORDER — MORPHINE SULFATE ER 60 MG PO CP24: 60.0000 mg | ORAL_CAPSULE | Freq: Two times a day (BID) | ORAL | 0 refills | Status: DC

## 2015-10-22 NOTE — Patient Instructions (Signed)
PLEASE CALL ME WITH ANY PROBLEMS OR QUESTIONS ([phone])   WORK ON PACING/FORM/ADAPTIVE TECHNIQUES TO PRESERVE YOUR LEFT ARM.

## 2015-10-22 NOTE — Progress Notes (Signed)
After informed consent and preparation of the skin with betadine and isopropyl alcohol, I injected 30m (1cc) of celestone and 4cc of 1% lidocaine around the right small head biceps tendon via anterior approach. Additionally, aspiration was performed prior to injection. The patient tolerated well, and no complications were encountered. Afterward the area was cleaned and dressed. Post- injection instructions were provided.

## 2015-10-23 NOTE — Telephone Encounter (Signed)
I called the patient for the 2nd time with no answer, left a VM patient has been sent to [place] [phone].

## 2015-10-23 NOTE — Telephone Encounter (Signed)
Patient returned Danielle's call, patient will be reachable until 4:30pm today.

## 2015-10-23 NOTE — Telephone Encounter (Signed)
Pt returned Danielle's call. msg relayed, pt will call to schedule appt

## 2015-10-23 NOTE — Telephone Encounter (Signed)
Please refer to previous phone note.   I called the patient for the 2nd time with no answer, left a VM patient has been sent to [place] [phone].

## 2015-10-24 ENCOUNTER — Ambulatory Visit (INDEPENDENT_AMBULATORY_CARE_PROVIDER_SITE_OTHER): Payer: BLUE CROSS/BLUE SHIELD

## 2015-10-24 DIAGNOSIS — R002 Palpitations: Secondary | ICD-10-CM

## 2015-10-25 ENCOUNTER — Ambulatory Visit (INDEPENDENT_AMBULATORY_CARE_PROVIDER_SITE_OTHER): Payer: BLUE CROSS/BLUE SHIELD | Admitting: Psychology

## 2015-10-25 ENCOUNTER — Other Ambulatory Visit: Payer: BLUE CROSS/BLUE SHIELD

## 2015-10-25 DIAGNOSIS — F909 Attention-deficit hyperactivity disorder, unspecified type: Secondary | ICD-10-CM

## 2015-10-25 DIAGNOSIS — F4310 Post-traumatic stress disorder, unspecified: Secondary | ICD-10-CM | POA: Diagnosis not present

## 2015-10-25 DIAGNOSIS — F640 Transsexualism: Secondary | ICD-10-CM

## 2015-10-25 DIAGNOSIS — Z789 Other specified health status: Secondary | ICD-10-CM

## 2015-10-26 LAB — TESTOSTERONE,FREE AND TOTAL
TESTOSTERONE FREE: 3.5 pg/mL — AB (ref 7.2–24.0)
Testosterone: 203 ng/dL — ABNORMAL LOW (ref 264–916)

## 2015-10-26 NOTE — Telephone Encounter (Signed)
Returned patients call and relayed the previously stated information.

## 2015-10-30 ENCOUNTER — Encounter (HOSPITAL_COMMUNITY): Payer: Self-pay | Admitting: Emergency Medicine

## 2015-10-30 ENCOUNTER — Encounter: Payer: Self-pay | Admitting: Hematology

## 2015-10-30 ENCOUNTER — Emergency Department (HOSPITAL_COMMUNITY)
Admission: EM | Admit: 2015-10-30 | Discharge: 2015-10-30 | Disposition: A | Discharge status: Home / Self Care | Payer: BLUE CROSS/BLUE SHIELD | Attending: Emergency Medicine | Admitting: Emergency Medicine

## 2015-10-30 ENCOUNTER — Telehealth: Payer: Self-pay | Admitting: Interventional Cardiology

## 2015-10-30 ENCOUNTER — Emergency Department (HOSPITAL_COMMUNITY): Payer: BLUE CROSS/BLUE SHIELD

## 2015-10-30 ENCOUNTER — Ambulatory Visit (INDEPENDENT_AMBULATORY_CARE_PROVIDER_SITE_OTHER): Payer: BLUE CROSS/BLUE SHIELD | Admitting: Psychology

## 2015-10-30 ENCOUNTER — Encounter: Payer: Self-pay | Admitting: Endocrinology

## 2015-10-30 ENCOUNTER — Ambulatory Visit (INDEPENDENT_AMBULATORY_CARE_PROVIDER_SITE_OTHER): Payer: BLUE CROSS/BLUE SHIELD | Admitting: Endocrinology

## 2015-10-30 VITALS — BP 116/70 | HR 100 | Ht 68.0 in | Wt 230.0 lb

## 2015-10-30 DIAGNOSIS — S0990XA Unspecified injury of head, initial encounter: Secondary | ICD-10-CM | POA: Insufficient documentation

## 2015-10-30 DIAGNOSIS — W228XXA Striking against or struck by other objects, initial encounter: Secondary | ICD-10-CM | POA: Insufficient documentation

## 2015-10-30 DIAGNOSIS — Z79899 Other long term (current) drug therapy: Secondary | ICD-10-CM | POA: Diagnosis not present

## 2015-10-30 DIAGNOSIS — F640 Transsexualism: Secondary | ICD-10-CM

## 2015-10-30 DIAGNOSIS — Y92002 Bathroom of unspecified non-institutional (private) residence single-family (private) house as the place of occurrence of the external cause: Secondary | ICD-10-CM | POA: Insufficient documentation

## 2015-10-30 DIAGNOSIS — Y999 Unspecified external cause status: Secondary | ICD-10-CM | POA: Diagnosis not present

## 2015-10-30 DIAGNOSIS — R55 Syncope and collapse: Secondary | ICD-10-CM | POA: Diagnosis not present

## 2015-10-30 DIAGNOSIS — Y939 Activity, unspecified: Secondary | ICD-10-CM | POA: Diagnosis not present

## 2015-10-30 DIAGNOSIS — F4310 Post-traumatic stress disorder, unspecified: Secondary | ICD-10-CM | POA: Diagnosis not present

## 2015-10-30 DIAGNOSIS — Z789 Other specified health status: Secondary | ICD-10-CM

## 2015-10-30 DIAGNOSIS — F909 Attention-deficit hyperactivity disorder, unspecified type: Secondary | ICD-10-CM | POA: Diagnosis not present

## 2015-10-30 DIAGNOSIS — Z7951 Long term (current) use of inhaled steroids: Secondary | ICD-10-CM | POA: Diagnosis not present

## 2015-10-30 LAB — BASIC METABOLIC PANEL
Anion gap: 5 (ref 5–15)
BUN: 20 mg/dL (ref 6–20)
CHLORIDE: 102 mmol/L (ref 101–111)
CO2: 30 mmol/L (ref 22–32)
Calcium: 8.9 mg/dL (ref 8.9–10.3)
Creatinine, Ser: 1.15 mg/dL (ref 0.61–1.24)
GFR calc Af Amer: >60 mL/min (ref >60)
GLUCOSE: 107 mg/dL — AB (ref 65–99)
POTASSIUM: 4.4 mmol/L (ref 3.5–5.1)
SODIUM: 137 mmol/L (ref 135–145)

## 2015-10-30 LAB — CBC
HEMATOCRIT: 42 % (ref 39.0–52.0)
Hemoglobin: 13.5 g/dL (ref 13.0–17.0)
MCH: 27.7 pg (ref 26.0–34.0)
MCHC: 32.1 g/dL (ref 30.0–36.0)
MCV: 86.1 fL (ref 78.0–100.0)
Platelets: 146 10*3/uL — ABNORMAL LOW (ref 150–400)
RBC: 4.88 MIL/uL (ref 4.22–5.81)
RDW: 12.6 % (ref 11.5–15.5)
WBC: 3.6 10*3/uL — ABNORMAL LOW (ref 4.0–10.5)

## 2015-10-30 LAB — URINE MICROSCOPIC-ADD ON
BACTERIA UA: NONE SEEN
RBC / HPF: NONE SEEN RBC/hpf (ref 0–5)

## 2015-10-30 LAB — URINALYSIS, ROUTINE W REFLEX MICROSCOPIC
BILIRUBIN URINE: NEGATIVE
GLUCOSE, UA: NEGATIVE mg/dL
HGB URINE DIPSTICK: NEGATIVE
KETONES UR: NEGATIVE mg/dL
Nitrite: NEGATIVE
PH: 6.5 (ref 5.0–8.0)
Protein, ur: NEGATIVE mg/dL
Specific Gravity, Urine: 1.005 (ref 1.005–1.030)

## 2015-10-30 LAB — CBG MONITORING, ED: Glucose-Capillary: 118 mg/dL — ABNORMAL HIGH (ref 65–99)

## 2015-10-30 NOTE — Progress Notes (Signed)
Subjective:    Patient ID: Jesse Bowers, male    DOB: May 13, 1965, 50 y.o.   MRN: [id number]  HPI Pt is here for f/u of F to M transgender state.  He had breast reduction in approx 2001, and TSH/BSO in 1997. He sees a therapist on a regular basis.  He had a CVA in 2015, which was felt to be possibly related to the polycythemia (in addition to protein C deficiency; in 2016, testosterone was reduced, due to polycythemia; he has had 2 DVT's (LLE in 1992, and RUE in 2006; he has not had phlebotomy since early 2016).  He c/o slightly decreased muscle strength throughout the body, and assoc poor libido.  He will undergo repeat left TKR soon.  He says he never misses the testosterone injections.   Past Medical History:  Diagnosis Date  . Abnormal weight loss   . Anxiety    PTSD per patient  . Celiac disease   . Clotting disorder ([place])   . Gluten enteropathy   . H/O parotitis    right   . H/O protein C deficiency   . Hx-TIA (transient ischemic attack)   . Neck pain   . Polycythemia, secondary   . psoriatic arthritis   . Sleep apnea   . Syrinx of spinal cord ([place]) 01/06/2014 on MRI   c spine  . Transgendered     Past Surgical History:  Procedure Laterality Date  . ANKLE ARTHROSCOPY WITH RECONSTRUCTION Right 2007  . CHOLECYSTECTOMY    . HIP ARTHROSCOPY W/ LABRAL REPAIR Right 05/11/2013  . KNEE SURGERY Bilateral 1984   Right ACL, left PCL repair  . LIVER BIOPSY  2013   normal results.  [name] NASAL SEPTUM SURGERY N/A 09/20/2015  . THYROIDECTOMY, PARTIAL  2008    Social History   Social History  . Marital status: Married    Spouse name: N/A  . Number of children: 2  . 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 Smoking status: Never Smoker  . Smokeless tobacco: Never Used  . Alcohol use Yes     Comment: wine once a month  . Drug use: No  . Sexual activity: Yes    Birth  control/ protection: None     Comment: patient is a transgender on testosterone shots, no biological kids   Other Topics Concern  . 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 Relocated to [place] about 2 months ago from [place], [place]. Patient was in [place] for last 4 years and prior to that in [place]. His wife is working as Scientist, research (physical sciences) for [employer]. Patient is not working and applying for disability. They have 2 kids but no biologic children.     Current Outpatient Prescriptions on File Prior to Visit  Medication Sig Dispense Refill  . Calcipotriene-Betameth Diprop (ENSTILAR) 0.005-0.064 % FOAM Apply 1 application topically at bedtime.    . clindamycin (CLEOCIN) 150 MG capsule Take 450 mg by mouth as needed (DENTAL APPT). Reported on 09/14/2015    . clobetasol ointment (TEMOVATE) 3.00 % Apply 1 application topically 2 (two) times daily as needed (psoriasis).     . clorazepate (TRANXENE) 7.5 MG tablet Take 7.5-15 mg by mouth See admin instructions. Take  7.5 mg every morning, 7.5 mg at midday, and 15 mg at bedtime.    . cyclobenzaprine (FLEXERIL) 5 MG tablet Take 5 mg by mouth at bedtime.     [name] desonide (DESOWEN) 0.05 % ointment APPLY TO AFFECTED AREAS TWICE DAILY AS NEEDED FOR PSORIASIS.  2  . diclofenac sodium (VOLTAREN) 1 % GEL Apply 2 g topically 3 (three) times daily as needed (pain.).     [name] fluticasone (FLONASE) 50 MCG/ACT nasal spray Place 1 spray into both nostrils daily.    [name] morphine (KADIAN) 60 MG 24 hr capsule Take 1 capsule (60 mg total) by mouth every 12 (twelve) hours. 60 capsule 0  . NONFORMULARY OR COMPOUNDED ITEM Apply 1 application topically 3 (three) times daily. 8% ketamine, 5% amitriptyline, 5% baclofen, 5% gabapentin  60 GM (Patient taking differently: Apply 1 application topically. 8% ketamine, 5% amitriptyline, 5% baclofen, 5%  gabapentin  60 GM) 1 each 5  . Oxycodone HCl 10 MG TABS Take 1 tablet (10 mg total) by mouth every 8 (eight) hours as needed. (Patient taking differently: Take 10 mg by mouth every 8 (eight) hours as needed (pain.). ) 90 tablet 0  . prazosin (MINIPRESS) 2 MG capsule Take 6-8 mg by mouth at bedtime.     . prazosin (MINIPRESS) 5 MG capsule Take 6-8 mg by mouth at bedtime.     . Secukinumab 150 MG/ML SOAJ Inject 2 each into the skin every 28 (twenty-eight) days. Reported on 08/17/2015    . testosterone cypionate (DEPO-TESTOSTERONE) 200 MG/ML injection Inject 0.25 mLs (50 mg total) into the muscle once a week. (Patient taking differently: Inject 50 mg into the muscle once a week. Sunday.) 10 mL 0  . XARELTO 20 MG TABS tablet TAKE 1 TABLET (20 MG TOTAL) BY MOUTH DAILY WITH SUPPER. 30 tablet 5   No current facility-administered medications on file prior to visit.     Allergies  Allergen Reactions  . Penicillin G Anaphylaxis    Has patient had a PCN reaction causing immediate rash, facial/tongue/throat swelling, SOB or lightheadedness with hypotension: Yes Has patient had a PCN reaction causing severe rash involving mucus membranes or skin necrosis: No Has patient had a PCN reaction that required hospitalization Yes Has patient had a PCN reaction occurring within the last 10 years: No If all of the above answers are "NO", then may proceed with Cephalosporin use.   . Vancomycin Rash  . Wheat Extract Other (See Comments)    CELIAC DISEASE  . Duloxetine Other (See Comments)    Restless legs  . Gabapentin Nausea Only  . Acetaminophen     Elevates liver enzymes.   . Ibuprofen     Contraindicated with Xarelto.   . Sulfa Antibiotics Rash    Stevens-Johnson rash    Family History  Problem Relation Age of Onset  . Stroke Maternal Grandfather     59   . Heart attack Maternal Grandfather   . Hypertension Mother   . Psoriasis Mother   . Cancer Paternal Grandfather   . Heart attack Paternal  Grandfather   . Stroke Paternal Uncle     age 15  . Stroke Maternal Grandmother   . Congestive Heart Failure Maternal Grandmother   . Heart attack Maternal Grandmother   . Protein C deficiency Sister 7    Miscarriages   BP 116/70   Pulse 100   Ht 5' 8"  (1.727 m)   Wt 230 lb (104.3 kg)   BMI 34.97 kg/m   Review  of Systems He reports mood lability and poor libido.      Objective:   Physical Exam VITAL SIGNS:  See vs page.   GENERAL: no distress.  Face: normal male beard.  GENITALIA: clitoris is approx 3 cm.    Ext: 1+ bilat leg edema.     Lab Results  Component Value Date   TESTOSTERONE 203 (L) 10/25/2015   Lab Results  Component Value Date   WBC 3.6 (L) 08/22/2015   HGB 13.9 08/22/2015   HCT 42.8 08/22/2015   MCV 86.5 08/22/2015   PLT 150 08/22/2015      Assessment & Plan:  transgender state: testosterone level is worse, and he is symptomatic Polycythemia: despite the above, this limits testosterone dosage to 50 mg q week.

## 2015-10-30 NOTE — ED Notes (Signed)
Patient reports that feeling off balance when he stands is normal for him.

## 2015-10-30 NOTE — Telephone Encounter (Signed)
New message        Calling to report an abnormal reading

## 2015-10-30 NOTE — ED Notes (Signed)
Bed: WA01 Expected date:  Expected time:  Means of arrival:  Comments: Hold for triage

## 2015-10-30 NOTE — Patient Instructions (Addendum)
Please continue the same testosterone injections.  Please come back for a follow-up appointment in 6 months.

## 2015-10-30 NOTE — Telephone Encounter (Signed)
[employer] calling informing that patient sent reading due to fainting at the time of transmission at 1156 today. They reported that Patient's monitor showed NR with HR in 90's, Patient reported that he passed out for 15 to 20 min's, and Patient complained of SOB and lightheadedness.   Called patient to see how he is doing. Patient stated that he was trying to have a bowel movement at the time of him passing out. Patient thinks he just bear down too long and passed out. Patient is on xarelto, but patient reports no pain, redness, or marks where he bumped his head.  Consulted DOD, Dr. Tamala Julian, he advised patient to go to ED and get a CT of his head. Called patient about Dr. Thompson Caul recommendations. Patient has no transportation at this time. Patient stated he would try to get a ride to go to the hospital. Encouraged patient to call our office with any other questions or concerns.
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
## 2015-10-30 NOTE — ED Notes (Addendum)
Patient transported to CT 

## 2015-10-30 NOTE — ED Triage Notes (Signed)
Patient reports that today he was at [facility] and had syncopal episode and hit his head. Patient is on Xarelto.  Patient was having a bowel movement when the episode occurred. Patient is alert and oriented x4. Speaking in full sentences. Patient has a halter monitor on.

## 2015-10-30 NOTE — ED Notes (Signed)
PT felt off balance while standing during vitals

## 2015-10-30 NOTE — ED Notes (Signed)
Pt aware of urine sample.urinal in hand

## 2015-11-01 ENCOUNTER — Encounter: Payer: Self-pay | Admitting: Endocrinology

## 2015-11-02 ENCOUNTER — Other Ambulatory Visit: Payer: Self-pay | Admitting: Endocrinology

## 2015-11-02 ENCOUNTER — Encounter: Payer: Self-pay | Admitting: Hematology

## 2015-11-02 ENCOUNTER — Ambulatory Visit
Admission: RE | Admit: 2015-11-02 | Discharge: 2015-11-02 | Disposition: A | Discharge status: Home / Self Care | Payer: BLUE CROSS/BLUE SHIELD | Source: Ambulatory Visit | Attending: Neurology | Admitting: Neurology

## 2015-11-02 DIAGNOSIS — R200 Anesthesia of skin: Secondary | ICD-10-CM

## 2015-11-02 DIAGNOSIS — G950 Syringomyelia and syringobulbia: Secondary | ICD-10-CM | POA: Diagnosis not present

## 2015-11-02 DIAGNOSIS — R413 Other amnesia: Secondary | ICD-10-CM

## 2015-11-03 ENCOUNTER — Encounter: Payer: Self-pay | Admitting: Hematology

## 2015-11-05 ENCOUNTER — Encounter: Payer: Self-pay | Admitting: Neurology

## 2015-11-06 ENCOUNTER — Telehealth: Payer: Self-pay | Admitting: *Deleted

## 2015-11-06 ENCOUNTER — Encounter: Payer: Self-pay | Admitting: Neurology

## 2015-11-06 ENCOUNTER — Encounter: Payer: Self-pay | Admitting: Hematology

## 2015-11-06 NOTE — Telephone Encounter (Signed)
-----   Message from Britt Bottom, MD sent at 11/02/2015  8:02 PM EDT ----- Please let, Jesse Bowers know that the MRI of the brain and the MRI of the cervical spine showed no changes compared to the prior studies.    MRI of the brain shows mild chronic microvascular changes, as before. MRI of the cervical spine shows syringomyelia, unchanged.

## 2015-11-06 NOTE — Telephone Encounter (Signed)
I have spoken with Jesse Bowers this morning, and per RAS, advised that MRI brain and C-spine showed no new chnages.  He verbalized understanding of same/fim

## 2015-11-07 ENCOUNTER — Ambulatory Visit: Payer: BLUE CROSS/BLUE SHIELD

## 2015-11-07 ENCOUNTER — Telehealth: Payer: Self-pay | Admitting: Endocrinology

## 2015-11-07 ENCOUNTER — Other Ambulatory Visit (HOSPITAL_BASED_OUTPATIENT_CLINIC_OR_DEPARTMENT_OTHER): Payer: BLUE CROSS/BLUE SHIELD

## 2015-11-07 DIAGNOSIS — D751 Secondary polycythemia: Secondary | ICD-10-CM | POA: Diagnosis not present

## 2015-11-07 LAB — CBC WITH DIFFERENTIAL/PLATELET
BASO%: 0.1 % (ref 0.0–2.0)
Basophils Absolute: 0 10*3/uL (ref 0.0–0.1)
EOS ABS: 0 10*3/uL (ref 0.0–0.5)
EOS%: 0 % (ref 0.0–7.0)
HEMATOCRIT: 40.7 % (ref 38.4–49.9)
HEMOGLOBIN: 13.1 g/dL (ref 13.0–17.1)
LYMPH#: 1.1 10*3/uL (ref 0.9–3.3)
LYMPH%: 29.9 % (ref 14.0–49.0)
MCH: 27.2 pg (ref 27.2–33.4)
MCHC: 32.2 g/dL (ref 32.0–36.0)
MCV: 84.5 fL (ref 79.3–98.0)
MONO#: 0.3 10*3/uL (ref 0.1–0.9)
MONO%: 9.4 % (ref 0.0–14.0)
NEUT%: 60.6 % (ref 39.0–75.0)
NEUTROS ABS: 2.1 10*3/uL (ref 1.5–6.5)
PLATELETS: 140 10*3/uL (ref 140–400)
RBC: 4.81 10*6/uL (ref 4.20–5.82)
RDW: 13.8 % (ref 11.0–14.6)
WBC: 3.5 10*3/uL — AB (ref 4.0–10.3)

## 2015-11-07 NOTE — Telephone Encounter (Signed)
Patient dismissed from [facility] by Luisa Hart , effective 11/01/2015. Dismissal letter sent out by certified / registered mail. [email or web address]

## 2015-11-07 NOTE — Progress Notes (Signed)
Pt Hct 40.7. Below parameter of 45. No tx today. Pt asymptomatic. Given labs.

## 2015-11-08 ENCOUNTER — Encounter: Payer: Self-pay | Admitting: Neurology

## 2015-11-08 ENCOUNTER — Encounter: Payer: Self-pay | Admitting: Hematology

## 2015-11-08 ENCOUNTER — Other Ambulatory Visit: Payer: Self-pay | Admitting: *Deleted

## 2015-11-08 DIAGNOSIS — G950 Syringomyelia and syringobulbia: Secondary | ICD-10-CM

## 2015-11-12 ENCOUNTER — Ambulatory Visit: Payer: Self-pay | Admitting: Registered Nurse

## 2015-11-14 ENCOUNTER — Ambulatory Visit (INDEPENDENT_AMBULATORY_CARE_PROVIDER_SITE_OTHER): Payer: BLUE CROSS/BLUE SHIELD | Admitting: Psychology

## 2015-11-14 DIAGNOSIS — F339 Major depressive disorder, recurrent, unspecified: Secondary | ICD-10-CM | POA: Diagnosis not present

## 2015-11-20 ENCOUNTER — Ambulatory Visit (INDEPENDENT_AMBULATORY_CARE_PROVIDER_SITE_OTHER): Payer: BLUE CROSS/BLUE SHIELD | Admitting: Psychology

## 2015-11-20 DIAGNOSIS — F4310 Post-traumatic stress disorder, unspecified: Secondary | ICD-10-CM

## 2015-11-20 DIAGNOSIS — F909 Attention-deficit hyperactivity disorder, unspecified type: Secondary | ICD-10-CM

## 2015-11-22 ENCOUNTER — Ambulatory Visit (INDEPENDENT_AMBULATORY_CARE_PROVIDER_SITE_OTHER): Payer: BLUE CROSS/BLUE SHIELD | Admitting: Psychology

## 2015-11-22 ENCOUNTER — Telehealth: Payer: Self-pay | Admitting: *Deleted

## 2015-11-22 ENCOUNTER — Encounter: Payer: Self-pay | Admitting: Hematology

## 2015-11-22 DIAGNOSIS — F4310 Post-traumatic stress disorder, unspecified: Secondary | ICD-10-CM | POA: Diagnosis not present

## 2015-11-22 DIAGNOSIS — F909 Attention-deficit hyperactivity disorder, unspecified type: Secondary | ICD-10-CM | POA: Diagnosis not present

## 2015-11-22 NOTE — Telephone Encounter (Signed)
I called back and left a message: he should continue Xarelto. During the trip, he can also use compression stocks. I strongly encouraged him to stop every few hours to ambulate. He can also add baby aspirin once a day for the day of trip, the day before and after the trip. I encouraged him to call back if he has any other further questions.  [name] MD

## 2015-11-22 NOTE — Telephone Encounter (Signed)
Patient left detailed message on My Chart wanting to know if he should go to [facility] [employer].  He also left details about previous DVT's.   He is running a low-grade fever 100.1 for 4 days now and he is asking if he should make trip or if he should see his PCP.  Would like Dr. Burr Medico to call him [phone].

## 2015-11-27 ENCOUNTER — Ambulatory Visit (INDEPENDENT_AMBULATORY_CARE_PROVIDER_SITE_OTHER): Payer: BLUE CROSS/BLUE SHIELD | Admitting: Psychology

## 2015-11-27 DIAGNOSIS — F4310 Post-traumatic stress disorder, unspecified: Secondary | ICD-10-CM

## 2015-11-27 DIAGNOSIS — F909 Attention-deficit hyperactivity disorder, unspecified type: Secondary | ICD-10-CM

## 2015-11-29 ENCOUNTER — Ambulatory Visit (INDEPENDENT_AMBULATORY_CARE_PROVIDER_SITE_OTHER): Payer: BLUE CROSS/BLUE SHIELD | Admitting: Psychology

## 2015-11-29 DIAGNOSIS — F4310 Post-traumatic stress disorder, unspecified: Secondary | ICD-10-CM | POA: Diagnosis not present

## 2015-11-30 ENCOUNTER — Telehealth: Payer: Self-pay | Admitting: Interventional Cardiology

## 2015-11-30 NOTE — Telephone Encounter (Signed)
Attempted to call pt back.  Pt answered phone and said hello several times, then stated he could not hear me. Tried calling number listed as work number and no answer. Will try again later.

## 2015-11-30 NOTE — Telephone Encounter (Signed)
New Message  Pt voiced he is wanting to know if he needs a f/u regarding his monitor due to it being the 30 day mark.

## 2015-12-03 NOTE — Telephone Encounter (Signed)
**Note De-Identified  Obfuscation** I called the pt back at [phone] as the phone number he left to call back is not working properly (we can hear him but he cant hear [place]). He is advised that once Dr Irish Lack receives and reviews his monitor results we will contact him with his results and schedule a f/u at that time, if needed. He verbalized understanding.

## 2015-12-04 ENCOUNTER — Ambulatory Visit (INDEPENDENT_AMBULATORY_CARE_PROVIDER_SITE_OTHER): Payer: BLUE CROSS/BLUE SHIELD | Admitting: Psychology

## 2015-12-04 DIAGNOSIS — F909 Attention-deficit hyperactivity disorder, unspecified type: Secondary | ICD-10-CM

## 2015-12-04 DIAGNOSIS — F4310 Post-traumatic stress disorder, unspecified: Secondary | ICD-10-CM | POA: Diagnosis not present

## 2015-12-05 ENCOUNTER — Telehealth: Payer: Self-pay | Admitting: Neurology

## 2015-12-05 NOTE — Telephone Encounter (Signed)
I received the results of a nerve conduction study and EMG from 11/26/2015 for evaluation of possible ulnar neuropathy.  The nerve conduction and the EMG studies of the left arm were normal.

## 2015-12-06 ENCOUNTER — Ambulatory Visit: Payer: BLUE CROSS/BLUE SHIELD | Admitting: Psychology

## 2015-12-10 ENCOUNTER — Encounter: Payer: BLUE CROSS/BLUE SHIELD | Attending: Physical Medicine & Rehabilitation | Admitting: Registered Nurse

## 2015-12-10 ENCOUNTER — Encounter: Payer: Self-pay | Admitting: Registered Nurse

## 2015-12-10 VITALS — BP 128/81 | HR 112

## 2015-12-10 DIAGNOSIS — M13862 Other specified arthritis, left knee: Secondary | ICD-10-CM | POA: Insufficient documentation

## 2015-12-10 DIAGNOSIS — I69351 Hemiplegia and hemiparesis following cerebral infarction affecting right dominant side: Secondary | ICD-10-CM | POA: Insufficient documentation

## 2015-12-10 DIAGNOSIS — Z9181 History of falling: Secondary | ICD-10-CM | POA: Diagnosis not present

## 2015-12-10 DIAGNOSIS — R209 Unspecified disturbances of skin sensation: Secondary | ICD-10-CM | POA: Diagnosis not present

## 2015-12-10 DIAGNOSIS — D6859 Other primary thrombophilia: Secondary | ICD-10-CM | POA: Insufficient documentation

## 2015-12-10 DIAGNOSIS — Z87890 Personal history of sex reassignment: Secondary | ICD-10-CM | POA: Insufficient documentation

## 2015-12-10 DIAGNOSIS — G8929 Other chronic pain: Secondary | ICD-10-CM | POA: Diagnosis present

## 2015-12-10 DIAGNOSIS — M25561 Pain in right knee: Secondary | ICD-10-CM

## 2015-12-10 DIAGNOSIS — F418 Other specified anxiety disorders: Secondary | ICD-10-CM | POA: Diagnosis not present

## 2015-12-10 DIAGNOSIS — Z79891 Long term (current) use of opiate analgesic: Secondary | ICD-10-CM | POA: Diagnosis not present

## 2015-12-10 DIAGNOSIS — D751 Secondary polycythemia: Secondary | ICD-10-CM | POA: Insufficient documentation

## 2015-12-10 DIAGNOSIS — M545 Low back pain, unspecified: Secondary | ICD-10-CM

## 2015-12-10 DIAGNOSIS — I69398 Other sequelae of cerebral infarction: Secondary | ICD-10-CM | POA: Diagnosis not present

## 2015-12-10 DIAGNOSIS — Z5181 Encounter for therapeutic drug level monitoring: Secondary | ICD-10-CM

## 2015-12-10 DIAGNOSIS — Z86718 Personal history of other venous thrombosis and embolism: Secondary | ICD-10-CM | POA: Insufficient documentation

## 2015-12-10 DIAGNOSIS — I69320 Aphasia following cerebral infarction: Secondary | ICD-10-CM | POA: Diagnosis not present

## 2015-12-10 DIAGNOSIS — M7712 Lateral epicondylitis, left elbow: Secondary | ICD-10-CM

## 2015-12-10 DIAGNOSIS — S43002A Unspecified subluxation of left shoulder joint, initial encounter: Secondary | ICD-10-CM | POA: Insufficient documentation

## 2015-12-10 DIAGNOSIS — K900 Celiac disease: Secondary | ICD-10-CM | POA: Insufficient documentation

## 2015-12-10 DIAGNOSIS — M546 Pain in thoracic spine: Secondary | ICD-10-CM

## 2015-12-10 DIAGNOSIS — Z79899 Other long term (current) drug therapy: Secondary | ICD-10-CM | POA: Insufficient documentation

## 2015-12-10 DIAGNOSIS — Z96652 Presence of left artificial knee joint: Secondary | ICD-10-CM

## 2015-12-10 DIAGNOSIS — M25562 Pain in left knee: Secondary | ICD-10-CM

## 2015-12-10 DIAGNOSIS — Z7901 Long term (current) use of anticoagulants: Secondary | ICD-10-CM | POA: Insufficient documentation

## 2015-12-10 DIAGNOSIS — L4050 Arthropathic psoriasis, unspecified: Secondary | ICD-10-CM | POA: Insufficient documentation

## 2015-12-10 DIAGNOSIS — M7522 Bicipital tendinitis, left shoulder: Secondary | ICD-10-CM | POA: Insufficient documentation

## 2015-12-10 DIAGNOSIS — G894 Chronic pain syndrome: Secondary | ICD-10-CM

## 2015-12-10 MED ORDER — MORPHINE SULFATE ER 60 MG PO CP24: 60.0000 mg | ORAL_CAPSULE | Freq: Two times a day (BID) | ORAL | 0 refills | Status: DC

## 2015-12-10 MED ORDER — OXYCODONE HCL 10 MG PO TABS: 10.0000 mg | ORAL_TABLET | Freq: Three times a day (TID) | ORAL | 0 refills | Status: DC | PRN
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
## 2015-12-11 ENCOUNTER — Ambulatory Visit (INDEPENDENT_AMBULATORY_CARE_PROVIDER_SITE_OTHER): Payer: BLUE CROSS/BLUE SHIELD | Admitting: Psychology

## 2015-12-11 DIAGNOSIS — F4310 Post-traumatic stress disorder, unspecified: Secondary | ICD-10-CM

## 2015-12-11 DIAGNOSIS — F909 Attention-deficit hyperactivity disorder, unspecified type: Secondary | ICD-10-CM | POA: Diagnosis not present

## 2015-12-13 ENCOUNTER — Ambulatory Visit (INDEPENDENT_AMBULATORY_CARE_PROVIDER_SITE_OTHER): Payer: BLUE CROSS/BLUE SHIELD | Admitting: Psychology

## 2015-12-13 DIAGNOSIS — F4310 Post-traumatic stress disorder, unspecified: Secondary | ICD-10-CM | POA: Diagnosis not present

## 2015-12-15 ENCOUNTER — Other Ambulatory Visit: Payer: Self-pay | Admitting: Endocrinology

## 2015-12-15 LAB — 6-ACETYLMORPHINE,TOXASSURE ADD
6-ACETYLMORPHINE: NEGATIVE
6-ACETYLMORPHINE: NOT DETECTED ng/mg{creat}

## 2015-12-15 LAB — TOXASSURE SELECT,+ANTIDEPR,UR

## 2015-12-18 ENCOUNTER — Ambulatory Visit (INDEPENDENT_AMBULATORY_CARE_PROVIDER_SITE_OTHER): Payer: BLUE CROSS/BLUE SHIELD | Admitting: Psychology

## 2015-12-18 DIAGNOSIS — F4310 Post-traumatic stress disorder, unspecified: Secondary | ICD-10-CM | POA: Diagnosis not present

## 2015-12-18 NOTE — Progress Notes (Signed)
Urine drug screen for this encounter is consistent for prescribed medication 

## 2015-12-20 ENCOUNTER — Telehealth: Payer: Self-pay | Admitting: Physical Medicine & Rehabilitation

## 2015-12-20 ENCOUNTER — Ambulatory Visit (INDEPENDENT_AMBULATORY_CARE_PROVIDER_SITE_OTHER): Payer: BLUE CROSS/BLUE SHIELD | Admitting: Psychology

## 2015-12-20 DIAGNOSIS — F4310 Post-traumatic stress disorder, unspecified: Secondary | ICD-10-CM | POA: Diagnosis not present

## 2015-12-20 NOTE — Telephone Encounter (Signed)
Patient has called in a left a message to schedule an appt for shoulder injections.  Patient claims that is has been awhile since the last injections.  Patient is experincing discomfort and pain and soreness Please advise.

## 2015-12-21 NOTE — Telephone Encounter (Signed)
He may be scheduled for injections

## 2015-12-25 ENCOUNTER — Ambulatory Visit (INDEPENDENT_AMBULATORY_CARE_PROVIDER_SITE_OTHER): Payer: BLUE CROSS/BLUE SHIELD | Admitting: Psychology

## 2015-12-25 DIAGNOSIS — F4310 Post-traumatic stress disorder, unspecified: Secondary | ICD-10-CM | POA: Diagnosis not present

## 2015-12-27 ENCOUNTER — Ambulatory Visit (INDEPENDENT_AMBULATORY_CARE_PROVIDER_SITE_OTHER): Payer: BLUE CROSS/BLUE SHIELD | Admitting: Psychology

## 2015-12-27 DIAGNOSIS — F909 Attention-deficit hyperactivity disorder, unspecified type: Secondary | ICD-10-CM

## 2015-12-27 DIAGNOSIS — F4310 Post-traumatic stress disorder, unspecified: Secondary | ICD-10-CM

## 2016-01-01 ENCOUNTER — Ambulatory Visit (INDEPENDENT_AMBULATORY_CARE_PROVIDER_SITE_OTHER): Payer: BLUE CROSS/BLUE SHIELD | Admitting: Psychology

## 2016-01-01 ENCOUNTER — Other Ambulatory Visit: Payer: Self-pay | Admitting: Endocrinology

## 2016-01-01 DIAGNOSIS — F4310 Post-traumatic stress disorder, unspecified: Secondary | ICD-10-CM

## 2016-01-02 ENCOUNTER — Encounter: Payer: Self-pay | Admitting: Endocrinology

## 2016-01-03 ENCOUNTER — Ambulatory Visit: Payer: Self-pay | Admitting: Psychology

## 2016-01-05 ENCOUNTER — Encounter: Payer: Self-pay | Admitting: Hematology

## 2016-01-05 ENCOUNTER — Other Ambulatory Visit: Payer: Self-pay | Admitting: Endocrinology

## 2016-01-05 ENCOUNTER — Encounter: Payer: Self-pay | Admitting: Neurology

## 2016-01-05 ENCOUNTER — Encounter: Payer: Self-pay | Admitting: Physical Medicine & Rehabilitation

## 2016-01-05 ENCOUNTER — Encounter: Payer: Self-pay | Admitting: Interventional Cardiology

## 2016-01-07 ENCOUNTER — Encounter: Payer: Self-pay | Admitting: *Deleted

## 2016-01-08 ENCOUNTER — Ambulatory Visit (INDEPENDENT_AMBULATORY_CARE_PROVIDER_SITE_OTHER): Payer: BLUE CROSS/BLUE SHIELD | Admitting: Psychology

## 2016-01-08 DIAGNOSIS — F909 Attention-deficit hyperactivity disorder, unspecified type: Secondary | ICD-10-CM | POA: Diagnosis not present

## 2016-01-08 DIAGNOSIS — F4310 Post-traumatic stress disorder, unspecified: Secondary | ICD-10-CM | POA: Diagnosis not present

## 2016-01-09 ENCOUNTER — Encounter: Payer: Self-pay | Admitting: Neurology

## 2016-01-09 NOTE — Progress Notes (Signed)
need orders for 12-6 surgery pre op is 11-29

## 2016-01-10 ENCOUNTER — Encounter: Payer: BLUE CROSS/BLUE SHIELD | Attending: Physical Medicine & Rehabilitation | Admitting: Registered Nurse

## 2016-01-10 ENCOUNTER — Encounter: Payer: Self-pay | Admitting: Registered Nurse

## 2016-01-10 ENCOUNTER — Ambulatory Visit: Payer: Self-pay | Admitting: Registered Nurse

## 2016-01-10 VITALS — BP 128/85 | HR 81 | Resp 14

## 2016-01-10 DIAGNOSIS — M25512 Pain in left shoulder: Secondary | ICD-10-CM

## 2016-01-10 DIAGNOSIS — D751 Secondary polycythemia: Secondary | ICD-10-CM | POA: Insufficient documentation

## 2016-01-10 DIAGNOSIS — I69320 Aphasia following cerebral infarction: Secondary | ICD-10-CM | POA: Diagnosis not present

## 2016-01-10 DIAGNOSIS — M545 Low back pain, unspecified: Secondary | ICD-10-CM

## 2016-01-10 DIAGNOSIS — Z87890 Personal history of sex reassignment: Secondary | ICD-10-CM | POA: Diagnosis not present

## 2016-01-10 DIAGNOSIS — M7522 Bicipital tendinitis, left shoulder: Secondary | ICD-10-CM | POA: Diagnosis not present

## 2016-01-10 DIAGNOSIS — Z7901 Long term (current) use of anticoagulants: Secondary | ICD-10-CM | POA: Insufficient documentation

## 2016-01-10 DIAGNOSIS — Z96652 Presence of left artificial knee joint: Secondary | ICD-10-CM

## 2016-01-10 DIAGNOSIS — Z9181 History of falling: Secondary | ICD-10-CM | POA: Insufficient documentation

## 2016-01-10 DIAGNOSIS — G8929 Other chronic pain: Secondary | ICD-10-CM | POA: Diagnosis present

## 2016-01-10 DIAGNOSIS — D6859 Other primary thrombophilia: Secondary | ICD-10-CM | POA: Insufficient documentation

## 2016-01-10 DIAGNOSIS — I69351 Hemiplegia and hemiparesis following cerebral infarction affecting right dominant side: Secondary | ICD-10-CM | POA: Diagnosis not present

## 2016-01-10 DIAGNOSIS — Z86718 Personal history of other venous thrombosis and embolism: Secondary | ICD-10-CM | POA: Insufficient documentation

## 2016-01-10 DIAGNOSIS — K900 Celiac disease: Secondary | ICD-10-CM | POA: Diagnosis not present

## 2016-01-10 DIAGNOSIS — Z5181 Encounter for therapeutic drug level monitoring: Secondary | ICD-10-CM

## 2016-01-10 DIAGNOSIS — Z79899 Other long term (current) drug therapy: Secondary | ICD-10-CM | POA: Insufficient documentation

## 2016-01-10 DIAGNOSIS — R209 Unspecified disturbances of skin sensation: Secondary | ICD-10-CM | POA: Insufficient documentation

## 2016-01-10 DIAGNOSIS — Z79891 Long term (current) use of opiate analgesic: Secondary | ICD-10-CM | POA: Insufficient documentation

## 2016-01-10 DIAGNOSIS — L4050 Arthropathic psoriasis, unspecified: Secondary | ICD-10-CM | POA: Insufficient documentation

## 2016-01-10 DIAGNOSIS — M13862 Other specified arthritis, left knee: Secondary | ICD-10-CM | POA: Insufficient documentation

## 2016-01-10 DIAGNOSIS — S43002A Unspecified subluxation of left shoulder joint, initial encounter: Secondary | ICD-10-CM | POA: Insufficient documentation

## 2016-01-10 DIAGNOSIS — F418 Other specified anxiety disorders: Secondary | ICD-10-CM | POA: Insufficient documentation

## 2016-01-10 DIAGNOSIS — M7712 Lateral epicondylitis, left elbow: Secondary | ICD-10-CM | POA: Diagnosis not present

## 2016-01-10 DIAGNOSIS — I69398 Other sequelae of cerebral infarction: Secondary | ICD-10-CM | POA: Insufficient documentation

## 2016-01-10 DIAGNOSIS — G894 Chronic pain syndrome: Secondary | ICD-10-CM

## 2016-01-10 MED ORDER — MORPHINE SULFATE ER 60 MG PO CP24: 60.0000 mg | ORAL_CAPSULE | Freq: Two times a day (BID) | ORAL | 0 refills | Status: DC

## 2016-01-10 MED ORDER — OXYCODONE HCL 10 MG PO TABS: 10.0000 mg | ORAL_TABLET | Freq: Three times a day (TID) | ORAL | 0 refills | Status: DC | PRN

## 2016-01-10 NOTE — Progress Notes (Signed)
Subjective:    Patient ID: Jesse Bowers, male    DOB: [birth year], 50 y.o.   MRN: [id number]  HPI:  [name] is a 50year old male who returns for follow-up for chronic pain and medication refill. He states his pain is located in his left shoulder, left elbow, lower back and left knee.He rates his pain 7. His current exercise regime is performing Yoga weekly, walking with his walker short distances in the home also using stationary peddler 3-4 times a week.   [name] scheduled for Left Total Knee Revision on 02/06/2016 by Dr. Wynelle Link  Pain Inventory Average Pain 8 Pain Right Now 7 My pain is constant, sharp, dull and aching  In the last 24 hours, has pain interfered with the following? General activity n/a Relation with others n/a Enjoyment of life n/a What TIME of day is your pain at its worst? morning, night Sleep (in general) Fair  Pain is worse with: walking, sitting, standing and some activites Pain improves with: medication and injections Relief from Meds: 8  Mobility walk with assistance how many minutes can you walk? 5 ability to climb steps?  no do you drive?  yes use a wheelchair transfers alone  Function disabled: date disabled n/a I need assistance with the following:  feeding, dressing, bathing, toileting, meal prep, household duties and shopping  Neuro/Psych bladder control problems weakness numbness trouble walking anxiety  Prior Studies Any changes since last visit?  no  Physicians involved in your care Any changes since last visit?  no   Family History  Problem Relation Age of Onset  . Stroke Maternal Grandfather     81  . Heart attack Maternal Grandfather   . Hypertension Mother   . Psoriasis Mother   . Cancer Paternal Grandfather   . Heart attack Paternal Grandfather   . Stroke Paternal Uncle     age 29  . Stroke Maternal Grandmother   . Congestive Heart Failure Maternal Grandmother   . Heart attack Maternal Grandmother     . Protein C deficiency Sister 25    Miscarriages   Social History   Social History  . Marital status: Married    Spouse name: N/A  . Number of children: 2  . 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 Smoking status: Never Smoker  . Smokeless tobacco: Never Used  . Alcohol use Yes     Comment: wine once a month  . Drug use: No  . Sexual activity: Yes    Birth control/ protection: None     Comment: patient is a transgender on testosterone shots, no biological kids   Other Topics Concern  . None   Social History Narrative   Education 4 year college, former Therapist, sports X 15 years, pediatric nurse practitioner x 6 years, did NP degree from [place] of [place]. Relocated to [place] about 2 months ago from [place], [place]. Patient was in [place] for last 4 years and prior to that in [place]. His wife is working as Scientist, research (physical sciences) for [employer]. Patient is not working and applying for disability. They have 2 kids but no biologic children.    Past Surgical History:  Procedure Laterality Date  . ANKLE ARTHROSCOPY WITH RECONSTRUCTION Right 2007  . CHOLECYSTECTOMY    . HIP ARTHROSCOPY W/ LABRAL REPAIR Right 05/11/2013  . KNEE SURGERY Bilateral 1984  Right ACL, left PCL repair  . LIVER BIOPSY  2013   normal results.  [name] NASAL SEPTUM SURGERY N/A 09/20/2015  . THYROIDECTOMY, PARTIAL  2008   Past Medical History:  Diagnosis Date  . Abnormal weight loss   . Anxiety    PTSD per patient  . Celiac disease   . Clotting disorder ([place])   . Gluten enteropathy   . H/O parotitis    right   . H/O protein C deficiency   . Hx-TIA (transient ischemic attack)   . Neck pain   . Polycythemia, secondary   . psoriatic arthritis   . Sleep apnea   . Syrinx of spinal cord ([place]) 01/06/2014 on MRI   c spine  . Transgendered    BP 128/85   Pulse 81   Resp 14   SpO2 96%   Opioid Risk Score:    Fall Risk Score:  `1  Depression screen PHQ 2/9  Depression screen [facility] 2/9 06/18/2015 05/24/2015 06/12/2014  Decreased Interest 2 2 2   Down, Depressed, Hopeless 0 0 0  PHQ - 2 Score 2 2 2   Altered sleeping - - 3  Tired, decreased energy - - 3  Change in appetite - - 0  Feeling bad or failure about yourself  - - 0  Trouble concentrating - - 3  Moving slowly or fidgety/restless - - 0  Suicidal thoughts - - 0  PHQ-9 Score - - 11  Some recent data might be hidden    Review of Systems  All other systems reviewed and are negative.      Objective:   Physical Exam  Constitutional: He is oriented to person, place, and time. He appears well-developed and well-nourished.  HENT:  Head: Normocephalic and atraumatic.  Neck: Normal range of motion. Neck supple.  Cardiovascular: Normal rate and regular rhythm.   Pulmonary/Chest: Effort normal and breath sounds normal.  Musculoskeletal:  Normal Muscle Bulk  and Muscle Testing Reveals: Upper Extremities: Full ROM and Muscle Strength 5/5 Lumbar Paraspinal Tenderness: L-4-L-5 Lower Extremities: Full ROM and Muscle Strength 5/5 Left Lower Extremity Flexion Produces Pain into Patella Arrived in wheelchair    Neurological: He is alert and oriented to person, place, and time.  Skin: Skin is warm and dry.  Psychiatric: He has a normal mood and affect.  Nursing note and vitals reviewed.         Assessment & Plan:  1. Psoriatic arthritis with pain in multiple areas, most prominently feet, hands, elbows.: Refilled:Kadian 60 mg 24 hr. Capsule, one capsule every 12 hours #60 and Continue Oxycodone 10 mg one tablet every 8 hours as needed #90.  We will continue the opioid monitoring program, this consists of regular clinic visits, examinations, urine drug screen, pill counts as well as use of [place] Controlled Substance Reporting System.  Rheumatology Following. 2. 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 Patello-femoral arthritis left knee: Continue Voltaren Gel S/P TKR on 07/03/14: Ortho Following: Dr. Wynelle Link will perform Total Left Knee Revision on 02/06/2016 4. Chronic mid- low back pain: Continue current medication regime, and encourage to increase activity as tolerated. 5. Polycythemia: Oncology Following.  6. Depression with anxiety : Continue Effexor  7. Muscle Spasm: Continue Flexeril  20 minutes of face to face patient care time was spent during this visit. All questions were encouraged and answered.

## 2016-01-15 ENCOUNTER — Ambulatory Visit (INDEPENDENT_AMBULATORY_CARE_PROVIDER_SITE_OTHER): Payer: BLUE CROSS/BLUE SHIELD | Admitting: Psychology

## 2016-01-15 DIAGNOSIS — F4323 Adjustment disorder with mixed anxiety and depressed mood: Secondary | ICD-10-CM | POA: Diagnosis not present

## 2016-01-15 DIAGNOSIS — F4310 Post-traumatic stress disorder, unspecified: Secondary | ICD-10-CM | POA: Diagnosis not present

## 2016-01-16 ENCOUNTER — Encounter: Payer: Self-pay | Admitting: *Deleted

## 2016-01-16 ENCOUNTER — Other Ambulatory Visit: Payer: Self-pay | Admitting: *Deleted

## 2016-01-17 ENCOUNTER — Ambulatory Visit (INDEPENDENT_AMBULATORY_CARE_PROVIDER_SITE_OTHER): Payer: BLUE CROSS/BLUE SHIELD | Admitting: Psychology

## 2016-01-17 DIAGNOSIS — F4310 Post-traumatic stress disorder, unspecified: Secondary | ICD-10-CM | POA: Diagnosis not present

## 2016-01-17 NOTE — Telephone Encounter (Signed)
Certified dismissal letter returned as undeliverable, unclaimed, return to sender after three attempts by [employer] on January 17, 2016. Letter placed in another envelope and resent as 1st class mail which does not require a signature. DAJ

## 2016-01-22 ENCOUNTER — Ambulatory Visit (INDEPENDENT_AMBULATORY_CARE_PROVIDER_SITE_OTHER): Payer: BLUE CROSS/BLUE SHIELD | Admitting: Psychology

## 2016-01-22 DIAGNOSIS — F4310 Post-traumatic stress disorder, unspecified: Secondary | ICD-10-CM | POA: Diagnosis not present

## 2016-01-23 NOTE — Progress Notes (Signed)
Has pre op 01/30/16--PLEASE ADD SURGICAL ORDERS IN EPIC  THANKS

## 2016-01-28 NOTE — Progress Notes (Signed)
Surgery on 12/6.  Proepon 11/29.  Need orders in EPIC Thank You

## 2016-01-29 ENCOUNTER — Ambulatory Visit: Payer: Self-pay | Admitting: Orthopedic Surgery

## 2016-01-29 ENCOUNTER — Ambulatory Visit: Payer: BLUE CROSS/BLUE SHIELD | Admitting: Psychology

## 2016-01-29 ENCOUNTER — Encounter: Payer: Self-pay | Admitting: Neurology

## 2016-01-30 ENCOUNTER — Inpatient Hospital Stay (HOSPITAL_COMMUNITY)
Admission: RE | Admit: 2016-01-30 | Discharge: 2016-01-30 | Disposition: A | Discharge status: Home / Self Care | Payer: Self-pay | Source: Ambulatory Visit

## 2016-01-30 NOTE — Patient Instructions (Signed)
Jesse Bowers  01/30/2016   Your procedure is scheduled on: Wednesday 02/06/2016  Report to [facility] Main  Entrance take [employer]  elevators to 3rd floor to  [place] at  [place] AM.  Call this number if you have problems the morning of surgery [phone]   Remember: ONLY 1 PERSON MAY GO WITH YOU TO SHORT STAY TO GET  READY MORNING OF [place].   Do not eat food or drink liquids :After Midnight.     Take these medicines the morning of surgery with A SIP OF WATER: Flonase nasal spray, [place] (Morphine), Venlafaxine (Effexor XR)                                 You may not have any metal on your body including hair pins and              piercings  Do not wear jewelry, make-up, lotions, powders or perfumes, deodorant             Do not wear nail polish.  Do not shave  48 hours prior to surgery.              Men may shave face and neck.   Do not bring valuables to the hospital. [place].  Contacts, dentures or bridgework may not be worn into surgery.  Leave suitcase in the car. After surgery it may be brought to your room.                  Please read over the following fact sheets you were given: _____________________________________________________________________             [facility] - Preparing for Surgery Before surgery, you can play an important role.  Because skin is not sterile, your skin needs to be as free of germs as possible.  You can reduce the number of germs on your skin by washing with CHG (chlorahexidine gluconate) soap before surgery.  CHG is an antiseptic cleaner which kills germs and bonds with the skin to continue killing germs even after washing. Please DO NOT use if you have an allergy to CHG or antibacterial soaps.  If your skin becomes reddened/irritated stop using the CHG and inform your nurse when you arrive at Short Stay. Do not shave (including legs and underarms) for  at least 48 hours prior to the first CHG shower.  You may shave your face/neck. Please follow these instructions carefully:  1.  Shower with CHG Soap the night before surgery and the  morning of Surgery.  2.  If you choose to wash your hair, wash your hair first as usual with your  normal  shampoo.  3.  After you shampoo, rinse your hair and body thoroughly to remove the  shampoo.                           4.  Use CHG as you would any other liquid soap.  You can apply chg directly  to the skin and wash                       Gently with a scrungie  or clean washcloth.  5.  Apply the CHG Soap to your body ONLY FROM THE NECK DOWN.   Do not use on face/ open                           Wound or open sores. Avoid contact with eyes, ears mouth and genitals (private parts).                       Wash face,  Genitals (private parts) with your normal soap.             6.  Wash thoroughly, paying special attention to the area where your surgery  will be performed.  7.  Thoroughly rinse your body with warm water from the neck down.  8.  DO NOT shower/wash with your normal soap after using and rinsing off  the CHG Soap.                9.  Pat yourself dry with a clean towel.            10.  Wear clean pajamas.            11.  Place clean sheets on your bed the night of your first shower and do not  sleep with pets. Day of Surgery : Do not apply any lotions/deodorants the morning of surgery.  Please wear clean clothes to the hospital/surgery center.  FAILURE TO FOLLOW THESE INSTRUCTIONS MAY RESULT IN THE CANCELLATION OF YOUR SURGERY PATIENT SIGNATURE_________________________________  NURSE SIGNATURE__________________________________  ________________________________________________________________________   [name]  An incentive spirometer is a tool that can help keep your lungs clear and active. This tool measures how well you are filling your lungs with each breath. Taking long deep  breaths may help reverse or decrease the chance of developing breathing (pulmonary) problems (especially infection) following:  A long period of time when you are unable to move or be active. BEFORE THE PROCEDURE   If the spirometer includes an indicator to show your best effort, your nurse or respiratory therapist will set it to a desired goal.  If possible, sit up straight or lean slightly forward. Try not to slouch.  Hold the incentive spirometer in an upright position. INSTRUCTIONS FOR USE  1. Sit on the edge of your bed if possible, or sit up as far as you can in bed or on a chair. 2. Hold the incentive spirometer in an upright position. 3. Breathe out normally. 4. Place the mouthpiece in your mouth and seal your lips tightly around it. 5. Breathe in slowly and as deeply as possible, raising the piston or the ball toward the top of the column. 6. Hold your breath for 3-5 seconds or for as long as possible. Allow the piston or ball to fall to the bottom of the column. 7. Remove the mouthpiece from your mouth and breathe out normally. 8. Rest for a few seconds and repeat Steps 1 through 7 at least 10 times every 1-2 hours when you are awake. Take your time and take a few normal breaths between deep breaths. 9. The spirometer may include an indicator to show your best effort. Use the indicator as a goal to work toward during each repetition. 10. After each set of 10 deep breaths, practice coughing to be sure your lungs are clear. If you have an incision (the cut made at the time of surgery), support your incision when  coughing by placing a pillow or rolled up towels firmly against it. Once you are able to get out of bed, walk around indoors and cough well. You may stop using the incentive spirometer when instructed by your caregiver.  RISKS AND COMPLICATIONS  Take your time so you do not get dizzy or light-headed.  If you are in pain, you may need to take or ask for pain medication before  doing incentive spirometry. It is harder to take a deep breath if you are having pain. AFTER USE  Rest and breathe slowly and easily.  It can be helpful to keep track of a log of your progress. Your caregiver can provide you with a simple table to help with this. If you are using the spirometer at home, follow these instructions: [place] IF:   You are having difficultly using the spirometer.  You have trouble using the spirometer as often as instructed.  Your pain medication is not giving enough relief while using the spirometer.  You develop fever of 100.5 F (38.1 C) or higher. SEEK IMMEDIATE MEDICAL CARE IF:   You cough up bloody sputum that had not been present before.  You develop fever of 102 F (38.9 C) or greater.  You develop worsening pain at or near the incision site. MAKE SURE YOU:   Understand these instructions.  Will watch your condition.  Will get help right away if you are not doing well or get worse. Document Released: 06/30/2006 Document Revised: 05/12/2011 Document Reviewed: 08/31/2006 ExitCare Patient Information 2014 ExitCare, [place].   ________________________________________________________________________  WHAT IS A BLOOD TRANSFUSION? Blood Transfusion Information  A transfusion is the replacement of blood or some of its parts. Blood is made up of multiple cells which provide different functions.  Red blood cells carry oxygen and are used for blood loss replacement.  White blood cells fight against infection.  Platelets control bleeding.  Plasma helps clot blood.  Other blood products are available for specialized needs, such as hemophilia or other clotting disorders. BEFORE THE TRANSFUSION  Who gives blood for transfusions?   Healthy volunteers who are fully evaluated to make sure their blood is safe. This is blood bank blood. Transfusion therapy is the safest it has ever been in the practice of medicine. Before blood is taken  from a donor, a complete history is taken to make sure that person has no history of diseases nor engages in risky social behavior (examples are intravenous drug use or sexual activity with multiple partners). The donor's travel history is screened to minimize risk of transmitting infections, such as malaria. The donated blood is tested for signs of infectious diseases, such as HIV and hepatitis. The blood is then tested to be sure it is compatible with you in order to minimize the chance of a transfusion reaction. If you or a relative donates blood, this is often done in anticipation of surgery and is not appropriate for emergency situations. It takes many days to process the donated blood. RISKS AND COMPLICATIONS Although transfusion therapy is very safe and saves many lives, the main dangers of transfusion include:   Getting an infectious disease.  Developing a transfusion reaction. This is an allergic reaction to something in the blood you were given. Every precaution is taken to prevent this. The decision to have a blood transfusion has been considered carefully by your caregiver before blood is given. Blood is not given unless the benefits outweigh the risks. AFTER THE TRANSFUSION  Right after receiving  a blood transfusion, you will usually feel much better and more energetic. This is especially true if your red blood cells have gotten low (anemic). The transfusion raises the level of the red blood cells which carry oxygen, and this usually causes an energy increase.  The nurse administering the transfusion will monitor you carefully for complications. HOME CARE INSTRUCTIONS  No special instructions are needed after a transfusion. You may find your energy is better. Speak with your caregiver about any limitations on activity for underlying diseases you may have. SEEK MEDICAL CARE IF:   Your condition is not improving after your transfusion.  You develop redness or irritation at the  intravenous (IV) site. SEEK IMMEDIATE MEDICAL CARE IF:  Any of the following symptoms occur over the next 12 hours:  Shaking chills.  You have a temperature by mouth above 102 F (38.9 C), not controlled by medicine.  Chest, back, or muscle pain.  People around you feel you are not acting correctly or are confused.  Shortness of breath or difficulty breathing.  Dizziness and fainting.  You get a rash or develop hives.  You have a decrease in urine output.  Your urine turns a dark color or changes to pink, red, or brown. Any of the following symptoms occur over the next 10 days:  You have a temperature by mouth above 102 F (38.9 C), not controlled by medicine.  Shortness of breath.  Weakness after normal activity.  The white part of the eye turns yellow (jaundice).  You have a decrease in the amount of urine or are urinating less often.  Your urine turns a dark color or changes to pink, red, or brown. Document Released: 02/15/2000 Document Revised: 05/12/2011 Document Reviewed: 10/04/2007 [facility] Patient Information 2014 [place], [place].  _______________________________________________________________________

## 2016-01-31 ENCOUNTER — Ambulatory Visit (INDEPENDENT_AMBULATORY_CARE_PROVIDER_SITE_OTHER): Payer: BLUE CROSS/BLUE SHIELD | Admitting: Psychology

## 2016-01-31 DIAGNOSIS — F4310 Post-traumatic stress disorder, unspecified: Secondary | ICD-10-CM

## 2016-02-01 ENCOUNTER — Ambulatory Visit: Payer: Self-pay | Admitting: Neurology

## 2016-02-01 ENCOUNTER — Encounter (HOSPITAL_COMMUNITY): Payer: Self-pay

## 2016-02-01 ENCOUNTER — Encounter (HOSPITAL_COMMUNITY)
Admission: RE | Admit: 2016-02-01 | Discharge: 2016-02-01 | Disposition: A | Discharge status: Home / Self Care | Payer: BLUE CROSS/BLUE SHIELD | Source: Ambulatory Visit | Attending: Orthopedic Surgery | Admitting: Orthopedic Surgery

## 2016-02-01 DIAGNOSIS — Z01812 Encounter for preprocedural laboratory examination: Secondary | ICD-10-CM | POA: Diagnosis present

## 2016-02-01 HISTORY — DX: Personal history of other medical treatment: Z92.89

## 2016-02-01 HISTORY — DX: Myoneural disorder, unspecified: G70.9

## 2016-02-01 HISTORY — DX: Cerebral infarction, unspecified: I63.9

## 2016-02-01 HISTORY — DX: Nausea with vomiting, unspecified: R11.2

## 2016-02-01 HISTORY — DX: Other specified postprocedural states: R11.2

## 2016-02-01 HISTORY — DX: Other specified postprocedural states: Z98.890

## 2016-02-01 LAB — URINALYSIS, ROUTINE W REFLEX MICROSCOPIC
Bilirubin Urine: NEGATIVE
GLUCOSE, UA: NEGATIVE mg/dL
Hgb urine dipstick: NEGATIVE
Ketones, ur: NEGATIVE mg/dL
LEUKOCYTES UA: NEGATIVE
Nitrite: NEGATIVE
PROTEIN: NEGATIVE mg/dL
Specific Gravity, Urine: 1.011 (ref 1.005–1.030)
pH: 7 (ref 5.0–8.0)

## 2016-02-01 LAB — COMPREHENSIVE METABOLIC PANEL
ALK PHOS: 224 U/L — AB (ref 38–126)
ALT: 64 U/L — AB (ref 17–63)
AST: 49 U/L — ABNORMAL HIGH (ref 15–41)
Albumin: 4.2 g/dL (ref 3.5–5.0)
Anion gap: 7 (ref 5–15)
BILIRUBIN TOTAL: 0.5 mg/dL (ref 0.3–1.2)
BUN: 14 mg/dL (ref 6–20)
CALCIUM: 8.8 mg/dL — AB (ref 8.9–10.3)
CO2: 28 mmol/L (ref 22–32)
CREATININE: 1.09 mg/dL (ref 0.61–1.24)
Chloride: 102 mmol/L (ref 101–111)
GFR calc non Af Amer: >60 mL/min (ref >60)
Glucose, Bld: 76 mg/dL (ref 65–99)
Potassium: 4.6 mmol/L (ref 3.5–5.1)
SODIUM: 137 mmol/L (ref 135–145)
Total Protein: 6.8 g/dL (ref 6.5–8.1)

## 2016-02-01 LAB — ABO/RH: ABO/RH(D): A POS

## 2016-02-01 LAB — CBC
HEMATOCRIT: 42.4 % (ref 39.0–52.0)
HEMOGLOBIN: 13.5 g/dL (ref 13.0–17.0)
MCH: 26.1 pg (ref 26.0–34.0)
MCHC: 31.8 g/dL (ref 30.0–36.0)
MCV: 82 fL (ref 78.0–100.0)
Platelets: 175 10*3/uL (ref 150–400)
RBC: 5.17 MIL/uL (ref 4.22–5.81)
RDW: 14.4 % (ref 11.5–15.5)
WBC: 4.4 10*3/uL (ref 4.0–10.5)

## 2016-02-01 LAB — APTT: aPTT: 28 seconds (ref 24–36)

## 2016-02-01 LAB — PROTIME-INR
INR: 0.96
Prothrombin Time: 12.8 seconds (ref 11.4–15.2)

## 2016-02-01 LAB — SURGICAL PCR SCREEN
MRSA, PCR: NEGATIVE
STAPHYLOCOCCUS AUREUS: NEGATIVE

## 2016-02-01 NOTE — Patient Instructions (Addendum)
Jesse Bowers  02/01/2016   Your procedure is scheduled on: 02-06-16  Report to [facility] Main  Entrance take [facility]  elevators to 3rd floor to  [place] at [place] AM.  Call this number if you have problems the morning of surgery [phone] Cpap use  Bring mask/ tubing.   Remember: ONLY 1 PERSON MAY GO WITH YOU TO SHORT STAY TO GET  READY MORNING OF YOUR SURGERY.  Do not eat food or drink liquids :After Midnight.     Take these medicines the morning of surgery with A SIP OF WATER: Clorazepate. Morphine. Venlafaxine. Flonase-if need. Stop Xarelto per MD instructions. DO NOT TAKE ANY DIABETIC MEDICATIONS DAY OF YOUR SURGERY                               You may not have any metal on your body including hair pins and              piercings  Do not wear jewelry, make-up, lotions, powders or perfumes, deodorant             Do not wear nail polish.  Do not shave  48 hours prior to surgery.              Men may shave face and neck.   Do not bring valuables to the hospital. [place].  Contacts, dentures or bridgework may not be worn into surgery.  Leave suitcase in the car. After surgery it may be brought to your room.     Patients discharged the day of surgery will not be allowed to drive home.  Name and phone number of your driver:Jesse Bowers -spouse [phone] cell  Special Instructions: N/A              Please read over the following fact sheets you were given: _____________________________________________________________________             [facility] - Preparing for Surgery Before surgery, you can play an important role.  Because skin is not sterile, your skin needs to be as free of germs as possible.  You can reduce the number of germs on your skin by washing with CHG (chlorahexidine gluconate) soap before surgery.  CHG is an antiseptic cleaner which kills germs and bonds with the skin to continue  killing germs even after washing. Please DO NOT use if you have an allergy to CHG or antibacterial soaps.  If your skin becomes reddened/irritated stop using the CHG and inform your nurse when you arrive at Short Stay. Do not shave (including legs and underarms) for at least 48 hours prior to the first CHG shower.  You may shave your face/neck. Please follow these instructions carefully:  1.  Shower with CHG Soap the night before surgery and the  morning of Surgery.  2.  If you choose to wash your hair, wash your hair first as usual with your  normal  shampoo.  3.  After you shampoo, rinse your hair and body thoroughly to remove the  shampoo.                           4.  Use CHG as you would any  other liquid soap.  You can apply chg directly  to the skin and wash                       Gently with a scrungie or clean washcloth.  5.  Apply the CHG Soap to your body ONLY FROM THE NECK DOWN.   Do not use on face/ open                           Wound or open sores. Avoid contact with eyes, ears mouth and genitals (private parts).                       Wash face,  Genitals (private parts) with your normal soap.             6.  Wash thoroughly, paying special attention to the area where your surgery  will be performed.  7.  Thoroughly rinse your body with warm water from the neck down.  8.  DO NOT shower/wash with your normal soap after using and rinsing off  the CHG Soap.                9.  Pat yourself dry with a clean towel.            10.  Wear clean pajamas.            11.  Place clean sheets on your bed the night of your first shower and do not  sleep with pets. Day of Surgery : Do not apply any lotions/deodorants the morning of surgery.  Please wear clean clothes to the hospital/surgery center.  FAILURE TO FOLLOW THESE INSTRUCTIONS MAY RESULT IN THE CANCELLATION OF YOUR SURGERY PATIENT SIGNATURE_________________________________  NURSE  SIGNATURE__________________________________  ________________________________________________________________________   Jesse Bowers  An incentive spirometer is a tool that can help keep your lungs clear and active. This tool measures how well you are filling your lungs with each breath. Taking long deep breaths may help reverse or decrease the chance of developing breathing (pulmonary) problems (especially infection) following:  A long period of time when you are unable to move or be active. BEFORE THE PROCEDURE   If the spirometer includes an indicator to show your best effort, your nurse or respiratory therapist will set it to a desired goal.  If possible, sit up straight or lean slightly forward. Try not to slouch.  Hold the incentive spirometer in an upright position. INSTRUCTIONS FOR USE  1. Sit on the edge of your bed if possible, or sit up as far as you can in bed or on a chair. 2. Hold the incentive spirometer in an upright position. 3. Breathe out normally. 4. Place the mouthpiece in your mouth and seal your lips tightly around it. 5. Breathe in slowly and as deeply as possible, raising the piston or the ball toward the top of the column. 6. Hold your breath for 3-5 seconds or for as long as possible. Allow the piston or ball to fall to the bottom of the column. 7. Remove the mouthpiece from your mouth and breathe out normally. 8. Rest for a few seconds and repeat Steps 1 through 7 at least 10 times every 1-2 hours when you are awake. Take your time and take a few normal breaths between deep breaths. 9. The spirometer may include an indicator to show your best effort. Use the indicator as a  goal to work toward during each repetition. 10. After each set of 10 deep breaths, practice coughing to be sure your lungs are clear. If you have an incision (the cut made at the time of surgery), support your incision when coughing by placing a pillow or rolled up towels firmly  against it. Once you are able to get out of bed, walk around indoors and cough well. You may stop using the incentive spirometer when instructed by your caregiver.  RISKS AND COMPLICATIONS  Take your time so you do not get dizzy or light-headed.  If you are in pain, you may need to take or ask for pain medication before doing incentive spirometry. It is harder to take a deep breath if you are having pain. AFTER USE  Rest and breathe slowly and easily.  It can be helpful to keep track of a log of your progress. Your caregiver can provide you with a simple table to help with this. If you are using the spirometer at home, follow these instructions: [place] IF:   You are having difficultly using the spirometer.  You have trouble using the spirometer as often as instructed.  Your pain medication is not giving enough relief while using the spirometer.  You develop fever of 100.5 F (38.1 C) or higher. SEEK IMMEDIATE MEDICAL CARE IF:   You cough up bloody sputum that had not been present before.  You develop fever of 102 F (38.9 C) or greater.  You develop worsening pain at or near the incision site. MAKE SURE YOU:   Understand these instructions.  Will watch your condition.  Will get help right away if you are not doing well or get worse. Document Released: 06/30/2006 Document Revised: 05/12/2011 Document Reviewed: 08/31/2006 ExitCare Patient Information 2014 ExitCare, [place].   ________________________________________________________________________  WHAT IS A BLOOD TRANSFUSION? Blood Transfusion Information  A transfusion is the replacement of blood or some of its parts. Blood is made up of multiple cells which provide different functions.  Red blood cells carry oxygen and are used for blood loss replacement.  White blood cells fight against infection.  Platelets control bleeding.  Plasma helps clot blood.  Other blood products are available for  specialized needs, such as hemophilia or other clotting disorders. BEFORE THE TRANSFUSION  Who gives blood for transfusions?   Healthy volunteers who are fully evaluated to make sure their blood is safe. This is blood bank blood. Transfusion therapy is the safest it has ever been in the practice of medicine. Before blood is taken from a donor, a complete history is taken to make sure that person has no history of diseases nor engages in risky social behavior (examples are intravenous drug use or sexual activity with multiple partners). The donor's travel history is screened to minimize risk of transmitting infections, such as malaria. The donated blood is tested for signs of infectious diseases, such as HIV and hepatitis. The blood is then tested to be sure it is compatible with you in order to minimize the chance of a transfusion reaction. If you or a relative donates blood, this is often done in anticipation of surgery and is not appropriate for emergency situations. It takes many days to process the donated blood. RISKS AND COMPLICATIONS Although transfusion therapy is very safe and saves many lives, the main dangers of transfusion include:   Getting an infectious disease.  Developing a transfusion reaction. This is an allergic reaction to something in the blood you were given. Every  precaution is taken to prevent this. The decision to have a blood transfusion has been considered carefully by your caregiver before blood is given. Blood is not given unless the benefits outweigh the risks. AFTER THE TRANSFUSION  Right after receiving a blood transfusion, you will usually feel much better and more energetic. This is especially true if your red blood cells have gotten low (anemic). The transfusion raises the level of the red blood cells which carry oxygen, and this usually causes an energy increase.  The nurse administering the transfusion will monitor you carefully for complications. HOME CARE  INSTRUCTIONS  No special instructions are needed after a transfusion. You may find your energy is better. Speak with your caregiver about any limitations on activity for underlying diseases you may have. SEEK MEDICAL CARE IF:   Your condition is not improving after your transfusion.  You develop redness or irritation at the intravenous (IV) site. SEEK IMMEDIATE MEDICAL CARE IF:  Any of the following symptoms occur over the next 12 hours:  Shaking chills.  You have a temperature by mouth above 102 F (38.9 C), not controlled by medicine.  Chest, back, or muscle pain.  People around you feel you are not acting correctly or are confused.  Shortness of breath or difficulty breathing.  Dizziness and fainting.  You get a rash or develop hives.  You have a decrease in urine output.  Your urine turns a dark color or changes to pink, red, or brown. Any of the following symptoms occur over the next 10 days:  You have a temperature by mouth above 102 F (38.9 C), not controlled by medicine.  Shortness of breath.  Weakness after normal activity.  The white part of the eye turns yellow (jaundice).  You have a decrease in the amount of urine or are urinating less often.  Your urine turns a dark color or changes to pink, red, or brown. Document Released: 02/15/2000 Document Revised: 05/12/2011 Document Reviewed: 10/04/2007 [facility] Patient Information 2014 [place], [place].  _______________________________________________________________________

## 2016-02-01 NOTE — Progress Notes (Signed)
CMP results in epic per PAT visit 02/01/2016 sent to Dr Wynelle Link and Arlee Muslim PA

## 2016-02-01 NOTE — Pre-Procedure Instructions (Signed)
EKG 8'17, Clearance note 01-05-16 Epic. Clearance note 01-07-16 x 2 with chart.

## 2016-02-04 ENCOUNTER — Encounter: Payer: Self-pay | Admitting: Neurology

## 2016-02-04 ENCOUNTER — Encounter: Payer: BLUE CROSS/BLUE SHIELD | Attending: Physical Medicine & Rehabilitation | Admitting: Registered Nurse

## 2016-02-04 ENCOUNTER — Encounter: Payer: Self-pay | Admitting: Registered Nurse

## 2016-02-04 VITALS — BP 127/85 | HR 105

## 2016-02-04 DIAGNOSIS — F418 Other specified anxiety disorders: Secondary | ICD-10-CM | POA: Diagnosis not present

## 2016-02-04 DIAGNOSIS — Z7901 Long term (current) use of anticoagulants: Secondary | ICD-10-CM | POA: Diagnosis not present

## 2016-02-04 DIAGNOSIS — M545 Low back pain, unspecified: Secondary | ICD-10-CM

## 2016-02-04 DIAGNOSIS — Z87890 Personal history of sex reassignment: Secondary | ICD-10-CM | POA: Diagnosis not present

## 2016-02-04 DIAGNOSIS — Z79899 Other long term (current) drug therapy: Secondary | ICD-10-CM | POA: Diagnosis not present

## 2016-02-04 DIAGNOSIS — D751 Secondary polycythemia: Secondary | ICD-10-CM | POA: Diagnosis not present

## 2016-02-04 DIAGNOSIS — Z86718 Personal history of other venous thrombosis and embolism: Secondary | ICD-10-CM | POA: Diagnosis not present

## 2016-02-04 DIAGNOSIS — M7522 Bicipital tendinitis, left shoulder: Secondary | ICD-10-CM | POA: Diagnosis not present

## 2016-02-04 DIAGNOSIS — G8929 Other chronic pain: Secondary | ICD-10-CM | POA: Insufficient documentation

## 2016-02-04 DIAGNOSIS — S43002A Unspecified subluxation of left shoulder joint, initial encounter: Secondary | ICD-10-CM | POA: Diagnosis not present

## 2016-02-04 DIAGNOSIS — Z96652 Presence of left artificial knee joint: Secondary | ICD-10-CM

## 2016-02-04 DIAGNOSIS — M7712 Lateral epicondylitis, left elbow: Secondary | ICD-10-CM

## 2016-02-04 DIAGNOSIS — I69351 Hemiplegia and hemiparesis following cerebral infarction affecting right dominant side: Secondary | ICD-10-CM | POA: Diagnosis not present

## 2016-02-04 DIAGNOSIS — Z9181 History of falling: Secondary | ICD-10-CM | POA: Diagnosis not present

## 2016-02-04 DIAGNOSIS — I69398 Other sequelae of cerebral infarction: Secondary | ICD-10-CM | POA: Diagnosis not present

## 2016-02-04 DIAGNOSIS — D6859 Other primary thrombophilia: Secondary | ICD-10-CM | POA: Diagnosis not present

## 2016-02-04 DIAGNOSIS — M13862 Other specified arthritis, left knee: Secondary | ICD-10-CM | POA: Diagnosis not present

## 2016-02-04 DIAGNOSIS — G894 Chronic pain syndrome: Secondary | ICD-10-CM | POA: Diagnosis not present

## 2016-02-04 DIAGNOSIS — L4050 Arthropathic psoriasis, unspecified: Secondary | ICD-10-CM

## 2016-02-04 DIAGNOSIS — Z5181 Encounter for therapeutic drug level monitoring: Secondary | ICD-10-CM

## 2016-02-04 DIAGNOSIS — Z79891 Long term (current) use of opiate analgesic: Secondary | ICD-10-CM | POA: Insufficient documentation

## 2016-02-04 DIAGNOSIS — R209 Unspecified disturbances of skin sensation: Secondary | ICD-10-CM | POA: Diagnosis not present

## 2016-02-04 DIAGNOSIS — I69320 Aphasia following cerebral infarction: Secondary | ICD-10-CM | POA: Diagnosis not present

## 2016-02-04 DIAGNOSIS — K900 Celiac disease: Secondary | ICD-10-CM | POA: Diagnosis not present

## 2016-02-04 MED ORDER — MORPHINE SULFATE ER 60 MG PO CP24: 60.0000 mg | ORAL_CAPSULE | Freq: Two times a day (BID) | ORAL | 0 refills | Status: DC

## 2016-02-04 MED ORDER — OXYCODONE HCL 10 MG PO TABS: 10.0000 mg | ORAL_TABLET | Freq: Three times a day (TID) | ORAL | 0 refills | Status: DC | PRN

## 2016-02-04 NOTE — Progress Notes (Signed)
Subjective:    Patient ID: Jesse Bowers, male    DOB: Sep 07, 1965, 50 y.o.   MRN: [id number]  HPI: [name] is a 50year old male who returns for follow-up for chronic pain and medication refill. He states his pain is located in his left elbow, lower back and left knee.Also states he's having generalized joint pain all over.He rates his pain 8. His current exercise regime is walking with his walker short distances in his home also using stationary peddler for upper and lower extremities 3-4 times a week.   Mr. Mcmanaway scheduled for Left Total Knee Revision on 02/06/2016 by Dr. Wynelle Link   Pain Inventory Average Pain 9 Pain Right Now 8 My pain is constant and dull  In the last 24 hours, has pain interfered with the following? General activity 9 Relation with others 9 Enjoyment of life 9 What TIME of day is your pain at its worst? all Sleep (in general) Fair  Pain is worse with: walking, bending, sitting, standing and some activites Pain improves with: heat/ice, medication, TENS and injections Relief from Meds: 6  Mobility walk with assistance use a walker ability to climb steps?  no do you drive?  yes use a wheelchair transfers alone  Function disabled: date disabled 2015 I need assistance with the following:  feeding, dressing, bathing, meal prep, household duties and shopping Do you have any goals in this area?  yes  Neuro/Psych bladder control problems weakness tremor trouble walking spasms confusion anxiety  Prior Studies Any changes since last visit?  no  Physicians involved in your care Any changes since last visit?  no   Family History  Problem Relation Age of Onset  . Stroke Maternal Grandfather     61  . Heart attack Maternal Grandfather   . Hypertension Mother   . Psoriasis Mother   . Cancer Paternal Grandfather   . Heart attack Paternal Grandfather   . Stroke Paternal Uncle     age 60  . Stroke Maternal Grandmother   . Congestive  Heart Failure Maternal Grandmother   . Heart attack Maternal Grandmother   . Protein C deficiency Sister 22    Miscarriages   Social History   Social History  . Marital status: Married    Spouse name: N/A  . Number of children: 2  . 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 Smoking status: Never Smoker  . Smokeless tobacco: Never Used  . Alcohol use Yes     Comment: wine once a month  . Drug use: No  . Sexual activity: Yes    Birth control/ protection: None     Comment: patient is a transgender on testosterone shots, no biological kids   Other Topics Concern  . Not on file   Social History Narrative   Education 4 year college, former Therapist, sports X 15 years, pediatric nurse practitioner x 6 years, did NP degree from [employer] [place]. Relocated to [place] about 2 months ago from [place], [place]. Patient was in [place] for last 4 years and prior to that in [place]. His wife is working as Scientist, research (physical sciences) for [employer]. Patient is not working and applying for disability. They have 2 kids but no biologic children.    Past Surgical History:  Procedure Laterality Date  . ABDOMINAL HYSTERECTOMY     TAH, BSO- tranverse incision.  [name]  ANKLE ARTHROSCOPY WITH RECONSTRUCTION Right 2007  . CHOLECYSTECTOMY     laparoscopic  . HIP ARTHROSCOPY W/ LABRAL REPAIR Right 05/11/2013  . KNEE SURGERY Bilateral 1984   Right ACL, left PCL repair  . LIVER BIOPSY  2013   normal results.  [name] NASAL SEPTUM SURGERY N/A 09/20/2015  . THYROIDECTOMY, PARTIAL  2008   Past Medical History:  Diagnosis Date  . Abnormal weight loss   . Anxiety    PTSD per patient  . Celiac disease   . Clotting disorder ([place])    Dr. Anne Fu  . Gender bias    prefers Male gender identity- "remains with vagina"  . Gluten enteropathy   . H/O parotitis    right   . H/O protein C deficiency   . Hx-TIA (transient  ischemic attack)    2015  . Neck pain   . Neuromuscular disorder ([place])    bilateral neuropathy feet.  . Polycythemia, secondary   . PONV (postoperative nausea and vomiting)   . psoriatic arthritis   . Sleep apnea    cpap use- Dr. [name] follows  . Stroke [facility])    Stroke left side -slight right sided weakness-Dr. [name] follows  . Syrinx of spinal cord ([place]) 01/06/2014 on MRI   c spine  . Transfusion history    past history- none recent  . Transgendered    BP 127/85 (BP Location: Left Arm, Patient Position: Sitting, Cuff Size: Large)   Pulse (!) 105   SpO2 93%   Opioid Risk Score:   Fall Risk Score:  `1  Depression screen PHQ 2/9  Depression screen [facility] 2/9 06/18/2015 05/24/2015  Decreased Interest 2 2  Down, Depressed, Hopeless 0 0  PHQ - 2 Score 2 2  Some recent data might be hidden   Review of Systems  HENT: Negative.   Eyes: Negative.   Respiratory: Negative.   Cardiovascular: Negative.   Gastrointestinal: Negative.   Endocrine: Negative.   Genitourinary: Positive for difficulty urinating.  Musculoskeletal: Positive for gait problem.  Skin: Negative.   Allergic/Immunologic: Negative.   Neurological: Positive for tremors and weakness.  Hematological: Negative.   Psychiatric/Behavioral: Positive for confusion. The patient is nervous/anxious.   All other systems reviewed and are negative.      Objective:   Physical Exam  Constitutional: He is oriented to person, place, and time. He appears well-developed and well-nourished.  HENT:  Head: Normocephalic and atraumatic.  Neck: Normal range of motion. Neck supple.  Cardiovascular: Normal rate and regular rhythm.   Pulmonary/Chest: Effort normal and breath sounds normal.  Musculoskeletal:  Normal Muscle Bulk and Muscle Testing Reveals: Upper Extremities: Full ROM and Muscle Strength 5/5 Lumbar Paraspinal Tenderness: L-4-L-5 Lower Extremities: Full ROM and Muscle Strength 5/5 Arrived in wheelchair  Neurological:  He is alert and oriented to person, place, and time.  Skin: Skin is warm and dry.  Psychiatric: He has a normal mood and affect.  Nursing note and vitals reviewed.         Assessment & Plan:  1. Psoriatic arthritis with pain in multiple areas, most prominently feet, hands, elbows.: Refilled:Kadian 60 mg 24 hr. Capsule, one capsule every 12 hours #60 and Continue Oxycodone 10 mg one tablet every 8 hours as needed #90.  We will continue the opioid monitoring program, this consists of regular clinic visits, examinations, urine drug screen, pill counts as well as use of [place] Controlled Substance Reporting System.  Rheumatology Following. 2. 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 3. Patello-femoral arthritis left knee: Continue Voltaren Gel S/P TKR on 07/03/14: Ortho Following: Dr. Wynelle Link will perform Total Left Knee Revision on 02/06/2016 4. Chronic mid- low back pain: Continue current medication regime, and encourage to increase activity as tolerated. 5. Polycythemia: Oncology Following.  6. Depression with anxiety : Continue to Monitor 7. Muscle Spasm: Continue Flexeril  20 minutes of face to face patient care time was spent during this visit. All questions were encouraged and answered.

## 2016-02-05 ENCOUNTER — Other Ambulatory Visit: Payer: Self-pay | Admitting: *Deleted

## 2016-02-05 ENCOUNTER — Ambulatory Visit: Payer: Self-pay | Admitting: Orthopedic Surgery

## 2016-02-05 ENCOUNTER — Ambulatory Visit (INDEPENDENT_AMBULATORY_CARE_PROVIDER_SITE_OTHER): Payer: BLUE CROSS/BLUE SHIELD | Admitting: Psychology

## 2016-02-05 DIAGNOSIS — F4310 Post-traumatic stress disorder, unspecified: Secondary | ICD-10-CM | POA: Diagnosis not present

## 2016-02-05 DIAGNOSIS — F909 Attention-deficit hyperactivity disorder, unspecified type: Secondary | ICD-10-CM | POA: Diagnosis not present

## 2016-02-05 MED ORDER — CYCLOBENZAPRINE HCL 5 MG PO TABS: ORAL_TABLET | ORAL | 5 refills | Status: DC

## 2016-02-05 NOTE — H&P (Signed)
Jesse Bowers DOB: 06-11-1965 Married / Language: English / Race: White Male Date of Admission:  02/06/2016 CC:  Left knee pain History of Present Illness The patient is a 50 year old male who comes in  for a preoperative History and Physical. The patient is scheduled for a left total knee arthroplasty (revision) to be performed by Dr. Dione Plover. Aluisio, MD at [facility] on 02-06-2016. The patient is a 50 year old male who presented for follow up of their knee. The patient is being followed for their left knee pain. They are now a year out from left knee arthrotomy with synovectomy and poly exchange; by Dr Len Childs. Symptoms reported include: pain, aching, giving way and instability. The patient feels that they are doing poorly. The following medication has been used for pain control: Morphine (103m BID), Oxycodone (from Dr. STessa Lerner 122mTID) and topical cream. He continues to have the significant pain and instability. He did have a positive bone scan for increased uptake in all three phases. It is felt that he would benefit from undergoing revision surgery of the knee. They have been treated conservatively in the past for the above stated problem and despite conservative measures, they continue to have progressive pain and severe functional limitations and dysfunction. They have failed non-operative management including home exercise, medications, and diagnostic testing. It is felt that they would benefit from undergoing total joint replacement. Risks and benefits of the procedure have been discussed with the patient and they elect to proceed with surgery. There are no active contraindications to surgery such as ongoing infection or rapidly progressive neurological disease.  Problem List/Past Medical Primary osteoarthritis of right knee (M17.11)  Pain of left hip joint (M25.552)  Medial epicondylitis of left elbow (M7B35.32 Complication of internal left knee prosthesis, subsequent encounter  (T84.9XXD)  Status post total left knee replacement (Z(D92.426 Cubital tunnel syndrome, left (G56.22)  Protein C deficiency (D68.59)  Fatty liver (K76.0)  History of Elevated Liver Enzymes  Anxiety Disorder  Autoimmune disorder  Psoriatic Arthritis Bleeding disorder  Blood Clot  2001, 2007 Cerebrovascular Accident  May 2015 Chronic Pain  Kidney Stone  Osteoarthritis  Other disease, cancer, significant illness  Peripheral Neuropathy  Sleep Apnea  uses CPAP Chronic pain of left knee (M25.562)  Impaired Memory  Impaired Hearing  Polycythemia  Chronic Small Vessel Ischemic Disease  Constipation  Psoriasis   Allergies  PenicillAMINE *Miscellaneous Therapeutic Classes  severe allergy SulfADIAZINE Sodium *Sulfonamides  Stevens-Johnsons Rash Vancomycin HCl *Anti-infective Agents - Misc.  if pushed too fast through an IV Gabapentin *ANTICONVULSANTS*  restless legs Tegaderm *MEDICAL DEVICES AND SUPPLIES*  skin irriation  Family History  Cancer  Father, Paternal Grandfather. Cerebrovascular Accident  Maternal Grandfather. Chronic Obstructive Lung Disease  Maternal Grandmother, Mother. Congestive Heart Failure  Maternal Grandfather, Maternal Grandmother, Paternal Grandfather, Paternal Grandmother. Depression  Mother, Paternal Gr69Sister. Diabetes Mellitus  Maternal Grandmother, Paternal Grandmother. First Degree Relatives  reported Heart Disease  Maternal Grandfather, Maternal Grandmother, Mother, Paternal GrJon GillsPaternal Grandmother. Heart disease in male family member before age 1038Heart disease in male family member before age 4766Hypertension  Maternal Grandmother, Mother, Paternal GrJon GillsPaternal Grandmother. Osteoarthritis  Mother. Osteoporosis  Maternal Grandfather, Maternal Grandmother, Mother, Paternal Grandmother. Rheumatoid Arthritis  Mother, Paternal Grandmother. Severe allergy  Maternal  Grandmother.  Social History Children  2 Current work status  disabled Exercise  Exercises rarely; does other Former drinker  07/27/2015: In the past drank wine and hard liquor only  occasionally per week Living situation  live with spouse Marital status  married No history of drug/alcohol rehab  Tobacco use  Never smoker. 07/27/2015 Under pain contract   Medication History Clorazepate Dipotassium (7.5MG Tablet, 4 Oral three times daily) Active. Acyclovir (5% Ointment, External) Active. Venlafaxine HCl ER (75MG Capsule ER 24HR, Oral) Active. Prazosin HCl (1MG Capsule, Oral) Active. Prazosin HCl (5MG Capsule, Oral) Active. Cyclobenzaprine HCl (5MG Tablet, Oral) Active. Secukinumab (150MG/ML Soln Auto-inj, Subcutaneous) Active. Kadian (60MG Capsule ER 24HR, Oral) Active. Miralax Active. OxyCODONE HCl (10MG Tablet, Oral) Active. Testosterone Cypionate (200MG/ML Solution, Intramuscular once a week) Active. Clindamycin HCl (150MG Capsule, Oral) Active. (for dental prophylaxis) Xarelto (20MG Tablet, Oral) Active.  Past Surgical History  Ankle Surgery  right Arthroscopy of Knee  bilateral Breast Biopsy  left Colon Polyp Removal - Colonoscopy  Gallbladder Surgery  laporoscopic Mammoplasty; Reduction  bilateral  right hip scope with labral debridement and chondroplasty Thyroidectomy; Subtotal  left Total Knee Replacement  left   Review of Systems  General Present- Fatigue and Memory Loss. Not Present- Chills, Fever, Night Sweats, Weight Gain and Weight Loss. Skin Not Present- Eczema, Hives, Itching, Lesions and Rash. HEENT Present- Blurred Vision and Hearing Loss. Not Present- Dentures, Double Vision, Headache, Tinnitus and Visual Loss. Respiratory Not Present- Allergies, Chronic Cough, Coughing up blood, Shortness of breath at rest and Shortness of breath with exertion. Cardiovascular Present- Palpitations. Not Present- Chest Pain, Difficulty  Breathing Lying Down, Murmur, Racing/skipping heartbeats and Swelling. Gastrointestinal Present- Constipation. Not Present- Abdominal Pain, Bloody Stool, Diarrhea, Difficulty Swallowing, Heartburn, Jaundice, Loss of appetitie, Nausea and Vomiting. Male Genitourinary Present- Urinating at Night. Not Present- Blood in Urine, Discharge, Flank Pain, Incontinence, Painful Urination, Urgency, Urinary frequency, Urinary Retention and Weak urinary stream. Musculoskeletal Present- Back Pain, Joint Pain, Joint Swelling, Morning Stiffness and Muscle Weakness. Not Present- Muscle Pain and Spasms. Neurological Not Present- Blackout spells, Difficulty with balance, Dizziness, Paralysis, Tremor and Weakness. Psychiatric Present- Insomnia.  Vitals Weight: 230 lb Height: 68in Weight was reported by patient. Height was reported by patient. Body Surface Area: 2.17 m Body Mass Index: 34.97 kg/m  Pulse: 84 (Regular)  BP: 122/78 (Sitting, Right Arm, Standard)   Physical Exam General Mental Status -Alert, cooperative and good historian. General Appearance-pleasant, Not in acute distress. Orientation-Oriented X3. Build & Nutrition-Well nourished and Well developed.  Head and Neck Head-normocephalic, atraumatic . Neck Global Assessment - supple, no bruit auscultated on the right, no bruit auscultated on the left.  Eye Pupil - Bilateral-Regular and Round. Motion - Bilateral-EOMI.  Chest and Lung Exam Auscultation Breath sounds - clear at anterior chest wall and clear at posterior chest wall. Adventitious sounds - No Adventitious sounds.  Cardiovascular Auscultation Rhythm - Regular rate and rhythm. Heart Sounds - S1 WNL and S2 WNL. Murmurs & Other Heart Sounds - Auscultation of the heart reveals - No Murmurs.  Abdomen Palpation/Percussion Tenderness - Abdomen is non-tender to palpation. Rigidity (guarding) - Abdomen is soft. Auscultation Auscultation of the abdomen reveals  - Bowel sounds normal.  Male Genitourinary Note: Not done, not pertinent to present illness   Musculoskeletal Note: He is in no distress. His left knee shows no effusion. It is ranged about 0 to 110. He does have significant varus, valgus and AP laxity. He does not have any warmth about the knee.   Assessment & Plan  Complication of internal left knee prosthesis, subsequent encounter (T84.9XXD, [id number])  Note:Surgical Plans: Revision Left Total Knee Hip Replacement  Disposition: Home  PCP: Dr.  Concepcion Elk  Topical TXA  Anesthesia Issues: Nausea following anesthesia  May need hospital bed at home, used [facility] in the past  Signed electronically by Ok Edwards, III PA-C

## 2016-02-06 ENCOUNTER — Inpatient Hospital Stay (HOSPITAL_COMMUNITY): Payer: BLUE CROSS/BLUE SHIELD | Admitting: Certified Registered Nurse Anesthetist

## 2016-02-06 ENCOUNTER — Inpatient Hospital Stay (HOSPITAL_COMMUNITY)
Admission: RE | Admit: 2016-02-06 | Discharge: 2016-02-08 | DRG: 470 | Disposition: A | Discharge status: Home Health | Payer: BLUE CROSS/BLUE SHIELD | Source: Ambulatory Visit | Attending: Orthopedic Surgery | Admitting: Orthopedic Surgery

## 2016-02-06 ENCOUNTER — Encounter (HOSPITAL_COMMUNITY): Payer: Self-pay | Admitting: *Deleted

## 2016-02-06 ENCOUNTER — Encounter (HOSPITAL_COMMUNITY)
Admission: RE | Disposition: A | Discharge status: Home Health | Payer: Self-pay | Source: Ambulatory Visit | Attending: Orthopedic Surgery

## 2016-02-06 DIAGNOSIS — Y792 Prosthetic and other implants, materials and accessory orthopedic devices associated with adverse incidents: Secondary | ICD-10-CM | POA: Diagnosis present

## 2016-02-06 DIAGNOSIS — T84018S Broken internal joint prosthesis, other site, sequela: Secondary | ICD-10-CM

## 2016-02-06 DIAGNOSIS — Z888 Allergy status to other drugs, medicaments and biological substances status: Secondary | ICD-10-CM | POA: Diagnosis not present

## 2016-02-06 DIAGNOSIS — M1712 Unilateral primary osteoarthritis, left knee: Secondary | ICD-10-CM | POA: Diagnosis present

## 2016-02-06 DIAGNOSIS — Z96652 Presence of left artificial knee joint: Secondary | ICD-10-CM | POA: Diagnosis present

## 2016-02-06 DIAGNOSIS — T84023A Instability of internal left knee prosthesis, initial encounter: Principal | ICD-10-CM | POA: Diagnosis present

## 2016-02-06 DIAGNOSIS — Z6835 Body mass index (BMI) 35.0-35.9, adult: Secondary | ICD-10-CM | POA: Diagnosis not present

## 2016-02-06 DIAGNOSIS — Z8673 Personal history of transient ischemic attack (TIA), and cerebral infarction without residual deficits: Secondary | ICD-10-CM

## 2016-02-06 DIAGNOSIS — Z7901 Long term (current) use of anticoagulants: Secondary | ICD-10-CM | POA: Diagnosis not present

## 2016-02-06 DIAGNOSIS — M179 Osteoarthritis of knee, unspecified: Secondary | ICD-10-CM | POA: Diagnosis present

## 2016-02-06 DIAGNOSIS — Z882 Allergy status to sulfonamides status: Secondary | ICD-10-CM | POA: Diagnosis not present

## 2016-02-06 DIAGNOSIS — M1710 Unilateral primary osteoarthritis, unspecified knee: Secondary | ICD-10-CM | POA: Diagnosis present

## 2016-02-06 DIAGNOSIS — G4730 Sleep apnea, unspecified: Secondary | ICD-10-CM | POA: Diagnosis present

## 2016-02-06 DIAGNOSIS — Z79899 Other long term (current) drug therapy: Secondary | ICD-10-CM

## 2016-02-06 DIAGNOSIS — M25562 Pain in left knee: Secondary | ICD-10-CM | POA: Diagnosis present

## 2016-02-06 DIAGNOSIS — Z96659 Presence of unspecified artificial knee joint: Secondary | ICD-10-CM

## 2016-02-06 HISTORY — PX: TOTAL KNEE REVISION: SHX996

## 2016-02-06 LAB — TYPE AND SCREEN
ABO/RH(D): A POS
Antibody Screen: NEGATIVE

## 2016-02-06 SURGERY — TOTAL KNEE REVISION
Anesthesia: Monitor Anesthesia Care | Site: Knee | Laterality: Left

## 2016-02-06 MED ORDER — CLINDAMYCIN PHOSPHATE 600 MG/50ML IV SOLN
600.0000 mg | Freq: Four times a day (QID) | INTRAVENOUS | Status: AC
  Administered 2016-02-06 – 2016-02-07 (×2): 600 mg via INTRAVENOUS
  Filled 2016-02-06 (×2): qty 50

## 2016-02-06 MED ORDER — MORPHINE SULFATE ER 60 MG PO CP24: 60.0000 mg | ORAL_CAPSULE | Freq: Every day | ORAL | Status: DC

## 2016-02-06 MED ORDER — CLORAZEPATE DIPOTASSIUM 7.5 MG PO TABS
15.0000 mg | ORAL_TABLET | Freq: Every day | ORAL | Status: DC
  Filled 2016-02-06: qty 2

## 2016-02-06 MED ORDER — BUPIVACAINE HCL (PF) 0.5 % IJ SOLN
INTRAMUSCULAR | Status: AC
  Filled 2016-02-06: qty 30

## 2016-02-06 MED ORDER — ONDANSETRON HCL 4 MG PO TABS: 4.0000 mg | ORAL_TABLET | Freq: Four times a day (QID) | ORAL | Status: DC | PRN

## 2016-02-06 MED ORDER — MORPHINE SULFATE ER 60 MG PO CP24: 60.0000 mg | ORAL_CAPSULE | Freq: Two times a day (BID) | ORAL | Status: DC

## 2016-02-06 MED ORDER — SODIUM CHLORIDE 0.9 % IJ SOLN
INTRAMUSCULAR | Status: AC
  Filled 2016-02-06: qty 50

## 2016-02-06 MED ORDER — BUPIVACAINE IN DEXTROSE 0.75-8.25 % IT SOLN
INTRATHECAL | Status: DC | PRN
  Administered 2016-02-06: 2 mL via INTRATHECAL

## 2016-02-06 MED ORDER — LACTATED RINGERS IV SOLN
INTRAVENOUS | Status: DC
  Administered 2016-02-06 (×3): via INTRAVENOUS

## 2016-02-06 MED ORDER — DEXAMETHASONE SODIUM PHOSPHATE 10 MG/ML IJ SOLN
10.0000 mg | Freq: Once | INTRAMUSCULAR | Status: AC
  Administered 2016-02-07: 10 mg via INTRAVENOUS
  Filled 2016-02-06: qty 1

## 2016-02-06 MED ORDER — POLYETHYLENE GLYCOL 3350 17 G PO PACK: 17.0000 g | PACK | Freq: Every day | ORAL | Status: DC | PRN

## 2016-02-06 MED ORDER — OXYCODONE HCL 5 MG PO TABS
5.0000 mg | ORAL_TABLET | ORAL | Status: DC | PRN
  Administered 2016-02-06 – 2016-02-08 (×10): 20 mg via ORAL
  Filled 2016-02-06 (×10): qty 4

## 2016-02-06 MED ORDER — SCOPOLAMINE 1 MG/3DAYS TD PT72
MEDICATED_PATCH | TRANSDERMAL | Status: AC
  Filled 2016-02-06: qty 1

## 2016-02-06 MED ORDER — STERILE WATER FOR IRRIGATION IR SOLN
Status: DC | PRN
  Administered 2016-02-06: 3000 mL

## 2016-02-06 MED ORDER — PHENYLEPHRINE 40 MCG/ML (10ML) SYRINGE FOR IV PUSH (FOR BLOOD PRESSURE SUPPORT)
PREFILLED_SYRINGE | INTRAVENOUS | Status: AC
  Filled 2016-02-06: qty 10

## 2016-02-06 MED ORDER — SODIUM CHLORIDE 0.9 % IR SOLN
Status: DC | PRN
  Administered 2016-02-06: 1000 mL

## 2016-02-06 MED ORDER — SODIUM CHLORIDE 0.9 % IV SOLN
INTRAVENOUS | Status: DC
  Administered 2016-02-06 (×2): via INTRAVENOUS

## 2016-02-06 MED ORDER — 0.9 % SODIUM CHLORIDE (POUR BTL) OPTIME
TOPICAL | Status: DC | PRN
  Administered 2016-02-06: 1000 mL

## 2016-02-06 MED ORDER — PHENYLEPHRINE 40 MCG/ML (10ML) SYRINGE FOR IV PUSH (FOR BLOOD PRESSURE SUPPORT)
PREFILLED_SYRINGE | INTRAVENOUS | Status: DC | PRN
  Administered 2016-02-06: 40 ug via INTRAVENOUS
  Administered 2016-02-06 (×3): 80 ug via INTRAVENOUS
  Administered 2016-02-06: 40 ug via INTRAVENOUS
  Administered 2016-02-06: 80 ug via INTRAVENOUS

## 2016-02-06 MED ORDER — BUPIVACAINE LIPOSOME 1.3 % IJ SUSP
20.0000 mL | Freq: Once | INTRAMUSCULAR | Status: DC
  Filled 2016-02-06: qty 20

## 2016-02-06 MED ORDER — LIDOCAINE 2% (20 MG/ML) 5 ML SYRINGE
INTRAMUSCULAR | Status: DC | PRN
  Administered 2016-02-06: 20 mg via INTRAVENOUS

## 2016-02-06 MED ORDER — BUPIVACAINE HCL (PF) 0.25 % IJ SOLN
INTRAMUSCULAR | Status: AC
  Filled 2016-02-06: qty 30

## 2016-02-06 MED ORDER — ONDANSETRON HCL 4 MG/2ML IJ SOLN
INTRAMUSCULAR | Status: AC
  Filled 2016-02-06: qty 2

## 2016-02-06 MED ORDER — FENTANYL CITRATE (PF) 100 MCG/2ML IJ SOLN
INTRAMUSCULAR | Status: AC
  Filled 2016-02-06: qty 2

## 2016-02-06 MED ORDER — ONDANSETRON HCL 4 MG/2ML IJ SOLN
INTRAMUSCULAR | Status: DC | PRN
  Administered 2016-02-06: 4 mg via INTRAVENOUS

## 2016-02-06 MED ORDER — SODIUM CHLORIDE 0.9 % IJ SOLN
INTRAMUSCULAR | Status: DC | PRN
  Administered 2016-02-06: 30 mL

## 2016-02-06 MED ORDER — PROPOFOL 500 MG/50ML IV EMUL
INTRAVENOUS | Status: DC | PRN
  Administered 2016-02-06: 75 ug/kg/min via INTRAVENOUS

## 2016-02-06 MED ORDER — FLEET ENEMA 7-19 GM/118ML RE ENEM: 1.0000 | ENEMA | Freq: Once | RECTAL | Status: DC | PRN

## 2016-02-06 MED ORDER — MIDAZOLAM HCL 5 MG/5ML IJ SOLN
INTRAMUSCULAR | Status: DC | PRN
  Administered 2016-02-06: 2 mg via INTRAVENOUS

## 2016-02-06 MED ORDER — CLINDAMYCIN PHOSPHATE 900 MG/50ML IV SOLN
INTRAVENOUS | Status: AC
Start: 2016-02-06 — End: 2016-02-06
  Filled 2016-02-06: qty 50

## 2016-02-06 MED ORDER — BISACODYL 10 MG RE SUPP: 10.0000 mg | Freq: Every day | RECTAL | Status: DC | PRN

## 2016-02-06 MED ORDER — DOCUSATE SODIUM 100 MG PO CAPS
100.0000 mg | ORAL_CAPSULE | Freq: Two times a day (BID) | ORAL | Status: DC
  Administered 2016-02-06 – 2016-02-08 (×4): 100 mg via ORAL
  Filled 2016-02-06 (×4): qty 1

## 2016-02-06 MED ORDER — DEXAMETHASONE SODIUM PHOSPHATE 10 MG/ML IJ SOLN
10.0000 mg | Freq: Once | INTRAMUSCULAR | Status: AC
  Administered 2016-02-06: 10 mg via INTRAVENOUS

## 2016-02-06 MED ORDER — VENLAFAXINE HCL ER 150 MG PO CP24
300.0000 mg | ORAL_CAPSULE | Freq: Every day | ORAL | Status: DC
  Administered 2016-02-07 – 2016-02-08 (×2): 300 mg via ORAL
  Filled 2016-02-06 (×2): qty 2

## 2016-02-06 MED ORDER — FENTANYL CITRATE (PF) 100 MCG/2ML IJ SOLN
INTRAMUSCULAR | Status: DC | PRN
  Administered 2016-02-06: 100 ug via INTRAVENOUS

## 2016-02-06 MED ORDER — SCOPOLAMINE 1 MG/3DAYS TD PT72
MEDICATED_PATCH | TRANSDERMAL | Status: DC | PRN
  Administered 2016-02-06: 1 via TRANSDERMAL

## 2016-02-06 MED ORDER — FLUTICASONE PROPIONATE 50 MCG/ACT NA SUSP
1.0000 | Freq: Every day | NASAL | Status: DC
  Administered 2016-02-08: 1 via NASAL
  Filled 2016-02-06: qty 16

## 2016-02-06 MED ORDER — CLORAZEPATE DIPOTASSIUM 7.5 MG PO TABS
7.5000 mg | ORAL_TABLET | Freq: Every day | ORAL | Status: DC
  Administered 2016-02-07: 7.5 mg via ORAL
  Filled 2016-02-06: qty 1

## 2016-02-06 MED ORDER — TRANEXAMIC ACID 1000 MG/10ML IV SOLN
2000.0000 mg | Freq: Once | INTRAVENOUS | Status: DC
  Filled 2016-02-06: qty 20

## 2016-02-06 MED ORDER — MIDAZOLAM HCL 2 MG/2ML IJ SOLN
INTRAMUSCULAR | Status: AC
  Filled 2016-02-06: qty 2

## 2016-02-06 MED ORDER — METOCLOPRAMIDE HCL 5 MG/ML IJ SOLN: 5.0000 mg | Freq: Three times a day (TID) | INTRAMUSCULAR | Status: DC | PRN

## 2016-02-06 MED ORDER — METHOCARBAMOL 500 MG PO TABS
500.0000 mg | ORAL_TABLET | Freq: Four times a day (QID) | ORAL | Status: DC | PRN
  Administered 2016-02-06 – 2016-02-08 (×5): 500 mg via ORAL
  Filled 2016-02-06 (×5): qty 1

## 2016-02-06 MED ORDER — PROPOFOL 10 MG/ML IV BOLUS
INTRAVENOUS | Status: DC | PRN
  Administered 2016-02-06: 20 mg via INTRAVENOUS

## 2016-02-06 MED ORDER — BUPIVACAINE HCL 0.25 % IJ SOLN
INTRAMUSCULAR | Status: DC | PRN
  Administered 2016-02-06: 30 mL

## 2016-02-06 MED ORDER — MORPHINE SULFATE ER 30 MG PO TBCR
60.0000 mg | EXTENDED_RELEASE_TABLET | Freq: Two times a day (BID) | ORAL | Status: DC
  Administered 2016-02-06 – 2016-02-08 (×4): 60 mg via ORAL
  Filled 2016-02-06 (×4): qty 2

## 2016-02-06 MED ORDER — CLINDAMYCIN PHOSPHATE 900 MG/50ML IV SOLN
900.0000 mg | INTRAVENOUS | Status: AC
  Administered 2016-02-06: 900 mg via INTRAVENOUS

## 2016-02-06 MED ORDER — PROMETHAZINE HCL 25 MG/ML IJ SOLN: 6.2500 mg | INTRAMUSCULAR | Status: DC | PRN

## 2016-02-06 MED ORDER — PROPOFOL 10 MG/ML IV BOLUS
INTRAVENOUS | Status: AC
  Filled 2016-02-06: qty 60

## 2016-02-06 MED ORDER — METOCLOPRAMIDE HCL 5 MG PO TABS
5.0000 mg | ORAL_TABLET | Freq: Three times a day (TID) | ORAL | Status: DC | PRN
Start: 2016-02-06 — End: 2016-02-08

## 2016-02-06 MED ORDER — ACETAMINOPHEN 10 MG/ML IV SOLN: 1000.0000 mg | Freq: Once | INTRAVENOUS | Status: DC

## 2016-02-06 MED ORDER — MENTHOL 3 MG MT LOZG: 1.0000 | LOZENGE | OROMUCOSAL | Status: DC | PRN

## 2016-02-06 MED ORDER — MORPHINE SULFATE (PF) 2 MG/ML IV SOLN
1.0000 mg | INTRAVENOUS | Status: DC | PRN
Start: 2016-02-06 — End: 2016-02-08
  Administered 2016-02-06: 1 mg via INTRAVENOUS
  Filled 2016-02-06: qty 1

## 2016-02-06 MED ORDER — METHOCARBAMOL 1000 MG/10ML IJ SOLN
500.0000 mg | Freq: Four times a day (QID) | INTRAVENOUS | Status: DC | PRN
  Administered 2016-02-06: 500 mg via INTRAVENOUS
  Filled 2016-02-06: qty 550
  Filled 2016-02-06: qty 5

## 2016-02-06 MED ORDER — RIVAROXABAN 10 MG PO TABS
10.0000 mg | ORAL_TABLET | Freq: Every day | ORAL | Status: DC
  Administered 2016-02-07 – 2016-02-08 (×2): 10 mg via ORAL
  Filled 2016-02-06 (×2): qty 1

## 2016-02-06 MED ORDER — DIPHENHYDRAMINE HCL 12.5 MG/5ML PO ELIX: 12.5000 mg | ORAL_SOLUTION | ORAL | Status: DC | PRN

## 2016-02-06 MED ORDER — PRAZOSIN HCL 5 MG PO CAPS
5.0000 mg | ORAL_CAPSULE | Freq: Every day | ORAL | Status: DC
  Administered 2016-02-06 – 2016-02-07 (×2): 5 mg via ORAL
  Filled 2016-02-06 (×2): qty 1

## 2016-02-06 MED ORDER — CYCLOBENZAPRINE HCL 5 MG PO TABS: 5.0000 mg | ORAL_TABLET | Freq: Every evening | ORAL | Status: DC | PRN

## 2016-02-06 MED ORDER — CLORAZEPATE DIPOTASSIUM 7.5 MG PO TABS
7.5000 mg | ORAL_TABLET | Freq: Two times a day (BID) | ORAL | Status: DC
Start: 2016-02-06 — End: 2016-02-06

## 2016-02-06 MED ORDER — ONDANSETRON HCL 4 MG/2ML IJ SOLN: 4.0000 mg | Freq: Four times a day (QID) | INTRAMUSCULAR | Status: DC | PRN

## 2016-02-06 MED ORDER — CHLORHEXIDINE GLUCONATE 4 % EX LIQD: 60.0000 mL | Freq: Once | CUTANEOUS | Status: DC

## 2016-02-06 MED ORDER — PHENOL 1.4 % MT LIQD: 1.0000 | OROMUCOSAL | Status: DC | PRN

## 2016-02-06 MED ORDER — BUPIVACAINE LIPOSOME 1.3 % IJ SUSP
INTRAMUSCULAR | Status: DC | PRN
Start: 2016-02-06 — End: 2016-02-06
  Administered 2016-02-06: 20 mL

## 2016-02-06 MED ORDER — TRANEXAMIC ACID 1000 MG/10ML IV SOLN
INTRAVENOUS | Status: DC | PRN
  Administered 2016-02-06: 2000 mg via TOPICAL

## 2016-02-06 MED ORDER — HYDROMORPHONE HCL 1 MG/ML IJ SOLN: 0.2500 mg | INTRAMUSCULAR | Status: DC | PRN

## 2016-02-06 SURGICAL SUPPLY — 61 items
ADAPTER BOLT FEMORAL +2/-2 (Knees) ×2 IMPLANT
AUGMENT FMRL POST PFC 4MM SZ5 (Orthopedic Implant) ×2 IMPLANT
BAG DECANTER FOR FLEXI CONT (MISCELLANEOUS) ×2 IMPLANT
BAG ZIPLOCK 12X15 (MISCELLANEOUS) IMPLANT
BANDAGE ACE 6X5 VEL STRL LF (GAUZE/BANDAGES/DRESSINGS) ×2 IMPLANT
BLADE SAG 18X100X1.27 (BLADE) ×2 IMPLANT
BLADE SAW SGTL 11.0X1.19X90.0M (BLADE) ×2 IMPLANT
BONE CEMENT GENTAMICIN (Cement) ×6 IMPLANT
CEMENT BONE GENTAMICIN 40 (Cement) ×3 IMPLANT
CEMENT RESTRICTOR DEPUY SZ 4 (Cement) ×2 IMPLANT
CLOTH BEACON ORANGE TIMEOUT ST (SAFETY) ×2 IMPLANT
CUFF TOURN SGL QUICK 34 (TOURNIQUET CUFF) ×1
CUFF TRNQT CYL 34X4X40X1 (TOURNIQUET CUFF) ×1 IMPLANT
DRAPE U-SHAPE 47X51 STRL (DRAPES) ×2 IMPLANT
DRSG ADAPTIC 3X8 NADH LF (GAUZE/BANDAGES/DRESSINGS) ×2 IMPLANT
DRSG PAD ABDOMINAL 8X10 ST (GAUZE/BANDAGES/DRESSINGS) ×2 IMPLANT
DURAPREP 26ML APPLICATOR (WOUND CARE) ×2 IMPLANT
ELECT REM PT RETURN 9FT ADLT (ELECTROSURGICAL) ×2
ELECTRODE REM PT RTRN 9FT ADLT (ELECTROSURGICAL) ×1 IMPLANT
EVACUATOR 1/8 PVC DRAIN (DRAIN) ×2 IMPLANT
FEM POST AUG PFC 4MM SZ5 (Orthopedic Implant) ×4 IMPLANT
FEMORAL ADAPTER (Orthopedic Implant) ×2 IMPLANT
FEMUR LFT TC3 REVISION (Knees) ×2 IMPLANT
GAUZE SPONGE 4X4 12PLY STRL (GAUZE/BANDAGES/DRESSINGS) ×2 IMPLANT
GLOVE BIO SURGEON STRL SZ7.5 (GLOVE) IMPLANT
GLOVE BIO SURGEON STRL SZ8 (GLOVE) ×2 IMPLANT
GLOVE BIOGEL PI IND STRL 7.5 (GLOVE) ×4 IMPLANT
GLOVE BIOGEL PI IND STRL 8 (GLOVE) ×1 IMPLANT
GLOVE BIOGEL PI INDICATOR 7.5 (GLOVE) ×4
GLOVE BIOGEL PI INDICATOR 8 (GLOVE) ×1
GLOVE SURG SS PI 6.5 STRL IVOR (GLOVE) IMPLANT
GOWN STRL REUS W/TWL LRG LVL3 (GOWN DISPOSABLE) ×4 IMPLANT
GOWN STRL REUS W/TWL XL LVL3 (GOWN DISPOSABLE) ×4 IMPLANT
HANDPIECE INTERPULSE COAX TIP (DISPOSABLE) ×1
IMMOBILIZER KNEE 20 (SOFTGOODS) ×2
IMMOBILIZER KNEE 20 THIGH 36 (SOFTGOODS) ×1 IMPLANT
INSERT TIBIAL TC3 RP SZ5 12.5 (Knees) ×2 IMPLANT
MANIFOLD NEPTUNE II (INSTRUMENTS) ×2 IMPLANT
NS IRRIG 1000ML POUR BTL (IV SOLUTION) ×2 IMPLANT
PACK TOTAL KNEE CUSTOM (KITS) ×2 IMPLANT
PADDING CAST COTTON 6X4 STRL (CAST SUPPLIES) ×2 IMPLANT
POSITIONER SURGICAL ARM (MISCELLANEOUS) ×2 IMPLANT
SET HNDPC FAN SPRY TIP SCT (DISPOSABLE) ×1 IMPLANT
STAPLER VISISTAT 35W (STAPLE) ×2 IMPLANT
STEM TIBIA PFC 13X30MM (Stem) ×2 IMPLANT
STEM UNIVERSAL REVISION 75X18 (Stem) ×2 IMPLANT
SUT PDS AB 1 CT1 27 (SUTURE) ×2 IMPLANT
SUT VIC AB 2-0 CT1 27 (SUTURE) ×3
SUT VIC AB 2-0 CT1 TAPERPNT 27 (SUTURE) ×3 IMPLANT
SUT VLOC 180 0 24IN GS25 (SUTURE) ×2 IMPLANT
SWAB COLLECTION DEVICE MRSA (MISCELLANEOUS) IMPLANT
SWAB CULTURE ESWAB REG 1ML (MISCELLANEOUS) IMPLANT
SYR 50ML LL SCALE MARK (SYRINGE) ×4 IMPLANT
TOWER CARTRIDGE SMART MIX (DISPOSABLE) ×2 IMPLANT
TRAY FOLEY W/METER SILVER 16FR (SET/KITS/TRAYS/PACK) ×2 IMPLANT
TRAY REVISION SZ 4 (Knees) ×2 IMPLANT
TRAY SLEEVE CEM ML (Knees) ×2 IMPLANT
TUBE KAMVAC SUCTION (TUBING) IMPLANT
WATER STERILE IRR 1500ML POUR (IV SOLUTION) ×2 IMPLANT
WEDGE SZ 3 10MM (Knees) ×4 IMPLANT
WRAP KNEE MAXI GEL POST OP (GAUZE/BANDAGES/DRESSINGS) ×2 IMPLANT

## 2016-02-06 NOTE — Transfer of Care (Signed)
Immediate Anesthesia Transfer of Care Note  Patient: Jesse Bowers  Procedure(s) Performed: Procedure(s): LEFT TOTAL KNEE REVISION (Left)  Patient Location: PACU  Anesthesia Type:Spinal  Level of Consciousness:  sedated, patient cooperative and responds to stimulation  Airway & Oxygen Therapy:Patient Spontanous Breathing and Patient connected to face mask oxgen  Post-op Assessment:  Report given to PACU RN and Post -op Vital signs reviewed and stable  Post vital signs:  Reviewed and stable  Last Vitals:  Vitals:   02/06/16 0820  BP: (!) 132/94  Pulse: 96  Resp: 18  Temp: 34.0 C    Complications: No apparent anesthesia complications

## 2016-02-06 NOTE — Anesthesia Procedure Notes (Signed)
Spinal  Patient location during procedure: OR Start time: 02/06/2016 11:04 AM End time: 02/06/2016 11:13 AM Staffing Anesthesiologist: Duane Boston Performed: anesthesiologist  Preanesthetic Checklist Completed: patient identified, surgical consent, pre-op evaluation, timeout performed, IV checked, risks and benefits discussed and monitors and equipment checked Spinal Block Patient position: sitting Prep: DuraPrep Patient monitoring: cardiac monitor, continuous pulse ox and blood pressure Approach: midline Location: L2-3 Injection technique: single-shot Needle Needle type: Pencan  Needle gauge: 24 G Needle length: 9 cm Additional Notes Functioning IV was confirmed and monitors were applied. Sterile prep and drape, including hand hygiene and sterile gloves were used. The patient was positioned and the spine was prepped. The skin was anesthetized with lidocaine.  Free flow of clear CSF was obtained prior to injecting local anesthetic into the CSF.  The spinal needle aspirated freely following injection.  The needle was carefully withdrawn.  The patient tolerated the procedure well.

## 2016-02-06 NOTE — Brief Op Note (Signed)
02/06/2016  12:53 PM  PATIENT:  Jesse Bowers  50 y.o. male  PRE-OPERATIVE DIAGNOSIS:  Unstable left  TKA  POST-OPERATIVE DIAGNOSIS:  Unstable left TKA  PROCEDURE:  Procedure(s): LEFT TOTAL KNEE REVISION (Left)  SURGEON:  Surgeon(s) and Role:    [name], MD - Primary  PHYSICIAN ASSISTANT:   ASSISTANTS: Arlee Muslim, PA-C   ANESTHESIA:   general  EBL:  Total I/O In: 1000 [I.V.:1000] Out: 51 [Blood:550]  BLOOD ADMINISTERED:none  DRAINS: (Medium) Hemovact drain(s) in the left knee with  Suction Open   LOCAL MEDICATIONS USED:  OTHER Exparel  COUNTS:  YES  TOURNIQUET:   Total Tourniquet Time Documented: Thigh (Left) - 37 minutes Thigh (Left) - 23 minutes Total: Thigh (Left) - 60 minutes   DICTATION: .Other Dictation: Dictation Number [id number]  PLAN OF CARE: Admit to inpatient   PATIENT DISPOSITION:  PACU - hemodynamically stable.

## 2016-02-06 NOTE — H&P (View-Only) (Signed)
Jesse Bowers DOB: 14-Jan-1966 Married / Language: English / Race: White Male Date of Admission:  02/06/2016 CC:  Left knee pain History of Present Illness The patient is a 50 year old male who comes in  for a preoperative History and Physical. The patient is scheduled for a left total knee arthroplasty (revision) to be performed by Dr. Dione Plover. Aluisio, MD at [facility] on 02-06-2016. The patient is a 50 year old male who presented for follow up of their knee. The patient is being followed for their left knee pain. They are now a year out from left knee arthrotomy with synovectomy and poly exchange; by Dr Len Childs. Symptoms reported include: pain, aching, giving way and instability. The patient feels that they are doing poorly. The following medication has been used for pain control: Morphine (37m BID), Oxycodone (from Dr. STessa Lerner 134mTID) and topical cream. He continues to have the significant pain and instability. He did have a positive bone scan for increased uptake in all three phases. It is felt that he would benefit from undergoing revision surgery of the knee. They have been treated conservatively in the past for the above stated problem and despite conservative measures, they continue to have progressive pain and severe functional limitations and dysfunction. They have failed non-operative management including home exercise, medications, and diagnostic testing. It is felt that they would benefit from undergoing total joint replacement. Risks and benefits of the procedure have been discussed with the patient and they elect to proceed with surgery. There are no active contraindications to surgery such as ongoing infection or rapidly progressive neurological disease.  Problem List/Past Medical Primary osteoarthritis of right knee (M17.11)  Pain of left hip joint (M25.552)  Medial epicondylitis of left elbow (M7G62.69 Complication of internal left knee prosthesis, subsequent encounter  (T84.9XXD)  Status post total left knee replacement (Z(S85.462 Cubital tunnel syndrome, left (G56.22)  Protein C deficiency (D68.59)  Fatty liver (K76.0)  History of Elevated Liver Enzymes  Anxiety Disorder  Autoimmune disorder  Psoriatic Arthritis Bleeding disorder  Blood Clot  2001, 2007 Cerebrovascular Accident  May 2015 Chronic Pain  Kidney Stone  Osteoarthritis  Other disease, cancer, significant illness  Peripheral Neuropathy  Sleep Apnea  uses CPAP Chronic pain of left knee (M25.562)  Impaired Memory  Impaired Hearing  Polycythemia  Chronic Small Vessel Ischemic Disease  Constipation  Psoriasis   Allergies  PenicillAMINE *Miscellaneous Therapeutic Classes  severe allergy SulfADIAZINE Sodium *Sulfonamides  Stevens-Johnsons Rash Vancomycin HCl *Anti-infective Agents - Misc.  if pushed too fast through an IV Gabapentin *ANTICONVULSANTS*  restless legs Tegaderm *MEDICAL DEVICES AND SUPPLIES*  skin irriation  Family History  Cancer  Father, Paternal Grandfather. Cerebrovascular Accident  Maternal Grandfather. Chronic Obstructive Lung Disease  Maternal Grandmother, Mother. Congestive Heart Failure  Maternal Grandfather, Maternal Grandmother, Paternal Grandfather, Paternal Grandmother. Depression  Mother, Paternal Gr27Sister. Diabetes Mellitus  Maternal Grandmother, Paternal Grandmother. First Degree Relatives  reported Heart Disease  Maternal Grandfather, Maternal Grandmother, Mother, Paternal GrJon GillsPaternal Grandmother. Heart disease in male family member before age 234Heart disease in male family member before age 1246Hypertension  Maternal Grandmother, Mother, Paternal GrJon GillsPaternal Grandmother. Osteoarthritis  Mother. Osteoporosis  Maternal Grandfather, Maternal Grandmother, Mother, Paternal Grandmother. Rheumatoid Arthritis  Mother, Paternal Grandmother. Severe allergy  Maternal  Grandmother.  Social History Children  2 Current work status  disabled Exercise  Exercises rarely; does other Former drinker  07/27/2015: In the past drank wine and hard liquor only  occasionally per week Living situation  live with spouse Marital status  married No history of drug/alcohol rehab  Tobacco use  Never smoker. 07/27/2015 Under pain contract   Medication History Clorazepate Dipotassium (7.5MG Tablet, 4 Oral three times daily) Active. Acyclovir (5% Ointment, External) Active. Venlafaxine HCl ER (75MG Capsule ER 24HR, Oral) Active. Prazosin HCl (1MG Capsule, Oral) Active. Prazosin HCl (5MG Capsule, Oral) Active. Cyclobenzaprine HCl (5MG Tablet, Oral) Active. Secukinumab (150MG/ML Soln Auto-inj, Subcutaneous) Active. Kadian (60MG Capsule ER 24HR, Oral) Active. Miralax Active. OxyCODONE HCl (10MG Tablet, Oral) Active. Testosterone Cypionate (200MG/ML Solution, Intramuscular once a week) Active. Clindamycin HCl (150MG Capsule, Oral) Active. (for dental prophylaxis) Xarelto (20MG Tablet, Oral) Active.  Past Surgical History  Ankle Surgery  right Arthroscopy of Knee  bilateral Breast Biopsy  left Colon Polyp Removal - Colonoscopy  Gallbladder Surgery  laporoscopic Mammoplasty; Reduction  bilateral  right hip scope with labral debridement and chondroplasty Thyroidectomy; Subtotal  left Total Knee Replacement  left   Review of Systems  General Present- Fatigue and Memory Loss. Not Present- Chills, Fever, Night Sweats, Weight Gain and Weight Loss. Skin Not Present- Eczema, Hives, Itching, Lesions and Rash. HEENT Present- Blurred Vision and Hearing Loss. Not Present- Dentures, Double Vision, Headache, Tinnitus and Visual Loss. Respiratory Not Present- Allergies, Chronic Cough, Coughing up blood, Shortness of breath at rest and Shortness of breath with exertion. Cardiovascular Present- Palpitations. Not Present- Chest Pain, Difficulty  Breathing Lying Down, Murmur, Racing/skipping heartbeats and Swelling. Gastrointestinal Present- Constipation. Not Present- Abdominal Pain, Bloody Stool, Diarrhea, Difficulty Swallowing, Heartburn, Jaundice, Loss of appetitie, Nausea and Vomiting. Male Genitourinary Present- Urinating at Night. Not Present- Blood in Urine, Discharge, Flank Pain, Incontinence, Painful Urination, Urgency, Urinary frequency, Urinary Retention and Weak urinary stream. Musculoskeletal Present- Back Pain, Joint Pain, Joint Swelling, Morning Stiffness and Muscle Weakness. Not Present- Muscle Pain and Spasms. Neurological Not Present- Blackout spells, Difficulty with balance, Dizziness, Paralysis, Tremor and Weakness. Psychiatric Present- Insomnia.  Vitals Weight: 230 lb Height: 68in Weight was reported by patient. Height was reported by patient. Body Surface Area: 2.17 m Body Mass Index: 34.97 kg/m  Pulse: 84 (Regular)  BP: 122/78 (Sitting, Right Arm, Standard)   Physical Exam General Mental Status -Alert, cooperative and good historian. General Appearance-pleasant, Not in acute distress. Orientation-Oriented X3. Build & Nutrition-Well nourished and Well developed.  Head and Neck Head-normocephalic, atraumatic . Neck Global Assessment - supple, no bruit auscultated on the right, no bruit auscultated on the left.  Eye Pupil - Bilateral-Regular and Round. Motion - Bilateral-EOMI.  Chest and Lung Exam Auscultation Breath sounds - clear at anterior chest wall and clear at posterior chest wall. Adventitious sounds - No Adventitious sounds.  Cardiovascular Auscultation Rhythm - Regular rate and rhythm. Heart Sounds - S1 WNL and S2 WNL. Murmurs & Other Heart Sounds - Auscultation of the heart reveals - No Murmurs.  Abdomen Palpation/Percussion Tenderness - Abdomen is non-tender to palpation. Rigidity (guarding) - Abdomen is soft. Auscultation Auscultation of the abdomen reveals  - Bowel sounds normal.  Male Genitourinary Note: Not done, not pertinent to present illness   Musculoskeletal Note: He is in no distress. His left knee shows no effusion. It is ranged about 0 to 110. He does have significant varus, valgus and AP laxity. He does not have any warmth about the knee.   Assessment & Plan  Complication of internal left knee prosthesis, subsequent encounter (T84.9XXD, [id number])  Note:Surgical Plans: Revision Left Total Knee Hip Replacement  Disposition: Home  PCP: Dr.  Concepcion Elk  Topical TXA  Anesthesia Issues: Nausea following anesthesia  May need hospital bed at home, used [facility] in the past  Signed electronically by Ok Edwards, III PA-C

## 2016-02-06 NOTE — Interval H&P Note (Signed)
History and Physical Interval Note:  02/06/2016 10:59 AM  Jesse Bowers  has presented today for surgery, with the diagnosis of aseptic left loosing TKA  The various methods of treatment have been discussed with the patient and family. After consideration of risks, benefits and other options for treatment, the patient has consented to  Procedure(s): LEFT TOTAL KNEE REVISION (Left) as a surgical intervention .  The patient's history has been reviewed, patient examined, no change in status, stable for surgery.  I have reviewed the patient's chart and labs.  Questions were answered to the patient's satisfaction.     Gearlean Alf

## 2016-02-06 NOTE — Anesthesia Postprocedure Evaluation (Signed)
Anesthesia Post Note  Patient: Jesse Bowers  Procedure(s) Performed: Procedure(s) (LRB): LEFT TOTAL KNEE REVISION (Left)  Patient location during evaluation: PACU Anesthesia Type: Spinal and MAC Level of consciousness: awake and alert Pain management: pain level controlled Vital Signs Assessment: post-procedure vital signs reviewed and stable Respiratory status: spontaneous breathing and respiratory function stable Cardiovascular status: blood pressure returned to baseline and stable Postop Assessment: spinal receding Anesthetic complications: no    Last Vitals:  Vitals:   02/06/16 1430 02/06/16 1451  BP: 118/90 116/72  Pulse: 97 94  Resp: (!) 29 15  Temp:  36.8 C    Last Pain:  Vitals:   02/06/16 1430  TempSrc:   PainSc: 0-No pain                 Tayana Shankle DANIEL

## 2016-02-06 NOTE — Anesthesia Preprocedure Evaluation (Addendum)
Anesthesia Evaluation  Patient identified by MRN, date of birth, ID band Patient awake    Reviewed: Allergy & Precautions, NPO status , Patient's Chart, lab work & pertinent test results  History of Anesthesia Complications (+) PONV and history of anesthetic complications  Airway Mallampati: II  TM Distance: >3 FB Neck ROM: Full    Dental no notable dental hx. (+) Dental Advisory Given   Pulmonary sleep apnea ,    Pulmonary exam normal        Cardiovascular negative cardio ROS Normal cardiovascular exam     Neuro/Psych PSYCHIATRIC DISORDERS Anxiety TIA   GI/Hepatic negative GI ROS, Neg liver ROS,   Endo/Other  Morbid obesity  Renal/GU negative Renal ROS     Musculoskeletal   Abdominal   Peds  Hematology   Anesthesia Other Findings   Reproductive/Obstetrics                           Anesthesia Physical Anesthesia Plan  ASA: III  Anesthesia Plan: MAC and Spinal   Post-op Pain Management:    Induction:   Airway Management Planned:   Additional Equipment:   Intra-op Plan:   Post-operative Plan:   Informed Consent: I have reviewed the patients History and Physical, chart, labs and discussed the procedure including the risks, benefits and alternatives for the proposed anesthesia with the patient or authorized representative who has indicated his/her understanding and acceptance.   Dental advisory given and Consent reviewed with POA  Plan Discussed with: CRNA and Anesthesiologist  Anesthesia Plan Comments:        Anesthesia Quick Evaluation

## 2016-02-07 LAB — CBC
HEMATOCRIT: 34.5 % — AB (ref 39.0–52.0)
Hemoglobin: 10.9 g/dL — ABNORMAL LOW (ref 13.0–17.0)
MCH: 26.6 pg (ref 26.0–34.0)
MCHC: 31.6 g/dL (ref 30.0–36.0)
MCV: 84.1 fL (ref 78.0–100.0)
PLATELETS: 166 10*3/uL (ref 150–400)
RBC: 4.1 MIL/uL — ABNORMAL LOW (ref 4.22–5.81)
RDW: 14.2 % (ref 11.5–15.5)
WBC: 6.5 10*3/uL (ref 4.0–10.5)

## 2016-02-07 LAB — BASIC METABOLIC PANEL
Anion gap: 5 (ref 5–15)
BUN: 17 mg/dL (ref 6–20)
CALCIUM: 8.4 mg/dL — AB (ref 8.9–10.3)
CO2: 29 mmol/L (ref 22–32)
CREATININE: 1.08 mg/dL (ref 0.61–1.24)
Chloride: 101 mmol/L (ref 101–111)
GLUCOSE: 175 mg/dL — AB (ref 65–99)
Potassium: 4.2 mmol/L (ref 3.5–5.1)
Sodium: 135 mmol/L (ref 135–145)

## 2016-02-07 MED ORDER — CLORAZEPATE DIPOTASSIUM 7.5 MG PO TABS
15.0000 mg | ORAL_TABLET | Freq: Every day | ORAL | Status: DC
  Administered 2016-02-07: 15 mg via ORAL
  Filled 2016-02-07: qty 2

## 2016-02-07 MED ORDER — CLORAZEPATE DIPOTASSIUM 7.5 MG PO TABS
7.5000 mg | ORAL_TABLET | ORAL | Status: DC
  Administered 2016-02-07 – 2016-02-08 (×2): 7.5 mg via ORAL
  Filled 2016-02-07 (×2): qty 1

## 2016-02-07 MED ORDER — CLORAZEPATE DIPOTASSIUM 7.5 MG PO TABS: 7.5000 mg | ORAL_TABLET | ORAL | Status: DC

## 2016-02-07 NOTE — Evaluation (Signed)
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  Pt should progress to dc home with family assist and HHPT follow up.    Follow Up Recommendations Home health PT    Equipment Recommendations  None recommended by PT    Recommendations for Other Services OT consult     Precautions / Restrictions Precautions Precautions: Knee;Fall Required Braces or Orthoses: Knee Immobilizer - Left Knee Immobilizer - Left: Discontinue once straight leg raise with < 10 degree lag Restrictions Weight Bearing Restrictions: No Other Position/Activity Restrictions: WBAT      Mobility  Bed Mobility Overal bed mobility: Needs Assistance Bed Mobility: Supine to Sit     Supine to sit: Min assist     General bed mobility comments: cues for sequence and use of R LE to self assist  Transfers Overall transfer level: Needs assistance Equipment used: Rolling walker (2 wheeled) Transfers: Sit to/from Stand Sit to Stand: Min assist;From elevated surface         General transfer comment: cues for LE management and use of UEs to self assist  Ambulation/Gait Ambulation/Gait assistance: Min assist Ambulation Distance (Feet): 60 Feet Assistive device: Rolling walker (2 wheeled) Gait Pattern/deviations: Step-to pattern;Decreased step length - right;Decreased step length - left;Shuffle;Trunk flexed Gait velocity: decr Gait velocity interpretation: Below normal speed for age/gender General Gait Details: cues for sequence, posture and position from [employer]            Wheelchair Mobility    Modified Rankin (Stroke Patients Only)        Balance                                             Pertinent Vitals/Pain Pain Assessment: 0-10 Pain Score: 7  Pain Location: L knee Pain Descriptors / Indicators: Aching;Sore Pain Intervention(s): Limited activity within patient's tolerance;Monitored during session;Premedicated before session;Ice applied    Home Living Family/patient expects to be discharged to:: Private residence Living Arrangements: Spouse/significant other Available Help at Discharge: Family Type of Home: House Home Access: Stairs to enter   Technical brewer of Steps: 1 Home Layout: Two level Home Equipment: Walker - 2 wheels;Crutches;Wheelchair - manual      Prior Function Level of Independence: Independent with assistive device(s)         Comments: Ltd ambulation 2* pain      Hand Dominance        Extremity/Trunk Assessment   Upper Extremity Assessment: Overall WFL for tasks assessed           Lower Extremity Assessment: RLE deficits/detail;LLE deficits/detail RLE Deficits / Details: Generalized weakness 2* CVA LLE Deficits / Details: 50  Cervical / Trunk Assessment: Normal  Communication   Communication: No difficulties  Cognition Arousal/Alertness: Awake/alert Behavior During Therapy: WFL for tasks assessed/performed Overall Cognitive Status: Within Functional Limits for tasks assessed                      General Comments      Exercises Total Joint Exercises Ankle Circles/Pumps: AROM;Both;15 reps;Supine  Quad Sets: AROM;Both;10 reps;Supine Heel Slides: AAROM;Left;15 reps;Supine Straight Leg Raises: AAROM;Left;10 reps;Supine   Assessment/Plan    PT Assessment Patient needs continued PT services  PT Problem List Decreased strength;Decreased range of motion;Decreased activity tolerance;Decreased balance;Decreased mobility;Decreased knowledge of use of DME;Pain          PT Treatment Interventions DME instruction;Gait training;Stair  training;Functional mobility training;Therapeutic activities;Therapeutic exercise;Patient/family education    PT Goals (Current goals can be found in the Care Plan section)  Acute Rehab PT Goals Patient Stated Goal: Regain IND PT Goal Formulation: With patient Time For Goal Achievement: 02/11/16 Potential to Achieve Goals: Good    Frequency 7X/week   Barriers to discharge        Co-evaluation               End of Session Equipment Utilized During Treatment: Gait belt;Left knee immobilizer Activity Tolerance: Patient tolerated treatment well Patient left: in chair;with call bell/phone within reach Nurse Communication: Mobility status         Time: 0800-0840 PT Time Calculation (min) (ACUTE ONLY): 40 min   Charges:   PT Evaluation $PT Eval Low Complexity: 1 Procedure PT Treatments $Gait Training: 8-22 mins $Therapeutic Exercise: 8-22 mins   PT G Codes:        Saia Derossett 02/11/16, 12:20 PM

## 2016-02-07 NOTE — Op Note (Signed)
NAME:  Jesse Bowers, Jesse Bowers NO.:  [id number]  MEDICAL RECORD NO.:  [id number]  LOCATION:  PERIO                        FACILITY:  [facility]  PHYSICIAN:  [name], M.D.    DATE OF BIRTH:  02-Aug-1965  DATE OF PROCEDURE:  02/06/2016 DATE OF DISCHARGE:                              OPERATIVE REPORT   PREOPERATIVE DIAGNOSIS:  Unstable left total knee arthroplasty.  POSTOPERATIVE DIAGNOSIS:  Unstable left total knee arthroplasty.  PROCEDURE:  Left total knee arthroplasty revision.  SURGEON:  [name], M.D.  ASSISTANT:  Alexzandrew L. Perkins, P.A.C.  ANESTHESIA:  General.  ESTIMATED BLOOD LOSS:  Minimal.  DRAINS:  Hemovac x1.  TOURNIQUET TIME:  Up 37 minutes at 300 mmHg.  Down at 8 minutes up additional 22 minutes at 300 mmHg.  COMPLICATIONS:  None.  CONDITION:  Stable to recovery.  BRIEF CLINICAL NOTE:  Jesse Bowers is a 50 year old male with a painful unstable left total knee.  He presents now for left total knee arthroplasty revision.  PROCEDURE IN DETAIL:  After successful administration of general anesthetic, a tourniquet was placed high on the left thigh and left lower extremity, prepped and draped in the usual sterile fashion. Extremities wrapped in Esmarch, tourniquet inflated to 300 mmHg. Midline incision was made with 10 blade through subcutaneous tissue to the extensor mechanism.  A fresh blade was used to make a medial parapatellar arthrotomy.  Soft tissue on the proximal medial tibia was subperiosteally elevated to the joint line with a knife into the semimembranosus bursa with a Cobb elevator.  Soft tissue laterally was elevated with attention being paid to avoid the patellar tendon on tibial tubercle.  The patella would not evert.  I subluxed the patella laterally first and then later did a quadriceps snip, so we could evert the patella and get out of the way of the tibial component.  We then removed the tibial polyethylene.  This was a fixed  bearing Attune. Circumferential retraction was placed around the tibia, get an oscillating saw, used to disrupt the interface between the tibial component and bone.  The components removed with no bone loss.  Cement was then removed from tibial canal.  The canal was thoroughly irrigated and reamed up to 13 mm for eventual placement of a 13 mm stem.  The size 4 was the most appropriate size for the tibia.  Proximal tibia was then prepared with the modular drill and then modular drill plus stem extension.  We then prepared proximally with the 29 mm sleeve by broaching to that size.  The tibial preparation was then completed.  The femur was then addressed.  The interface between the femoral component and bone was then disrupted using osteotomes and the femoral component removed with no bone loss.  Gained access to the femoral canal and thoroughly irrigated the canal.  Then reamed up to 18 mm, which had the best press-fit.  The distal femoral cutting block was then placed and this was 5 degrees of left valgus.  Removed about 2 mm off each side.  We did not need to use any augments distally.  Size 5 is going to be the most appropriate femoral size.  The  size 5 cutting block was placed in a +2 position, effectively raising the stem and lowering the flange of the prosthesis to the anterior cortex of the femur.  The rotation was marked at the epicondylar axis, confirmed by placing a spacer block to create a rectangular flexion space.  Block was pinned in this position.  Anterior and posterior chamfer cuts were made with no bone resected.  Posteriorly, we had to go in a +4 position to get bone and thus 4 mm augments were necessary posterior, medial, and lateral. Intercondylar block was then placed and intercondylar cut made for TC3. The trial components were placed.  On the tibial side, the size 4 MBT revision tray with a 13 x 30 stem extension, 29 sleeve, and 10 mm medial and lateral augments.   On the femoral side, is a size 5 TC3 femur with an 18 x 75 stem extension in a +2 position, 5 degrees of valgus, and then 4 mm posterior augments were also placed medial and lateral.  There were excellent fit with both trials.  A 12.5 mm insert was the appropriate thickness.  Full extension was achieved with excellent varus- valgus anterior-posterior balance throughout full range of motion.  The patella was again everted and patelloplasty was necessary to remove soft tissue on the patella.  The components in good position.  It was well fixed, thus did not need to be revised.  We then placed a moist sponge in the knee and we deflated the tourniquet.  After a tourniquet time of 37 minutes.  The tourniquet was held down for 8 minutes while the components were assembled on the back table.  Once the components were assembled and after 8 minutes, the leg was rewrapped in Esmarch and tourniquet reinflated to 300 mmHg.  The trials were removed.  We trialed the cement restrictor, which was a size 4.  It was placed to the appropriate depth in the tibial canal.  The cut bone surfaces and canals were then irrigated with pulsatile lavage.  The cement was mixed which was 3 batches of gentamicin impregnated cement.  We then injected the cement into tibial canal on cut surface and implanted the tibial component.  It is impacted into position.  All extruded cement removed. On the femoral side, we cemented distally with a press-fit stem.  The permanent components were the same size as the trials.  The 12.5 mm trial inserts placed.  Knee held in full extension and all extruded cement removed.  Once the cement was fully hardened, then the permanent 12.5 mm TC3 rotating platform insert was placed in the tibial tray.  The knee was reduced with outstanding stability with excellent varus-valgus and anterior-posterior stability throughout full range of motion.  Wound was copiously irrigated with saline solution  and 20 mL of Exparel mixed with 40 mL of saline were injected into the extensor mechanism, subcu tissues, and the periosteum of the femur.  An additional 20 mL of 0.25% Marcaine injected into the same tissues.  Quad snip was closed with interrupted #1 PDS and then the extensor mechanism arthrotomy closed with a running #1 V-Loc suture.  Flexion against gravity was 125 degrees.  Tourniquet was then released for the second tourniquet time of 22 minutes.  Another drain was placed in the subcu and the subcu were closed with interrupted 2-0 Vicryl.  Skin was closed with staples. Incision cleaned and dried and a bulky sterile dressing applied.  He was then placed into a knee immobilizer, awakened  and transported to recovery in stable condition.  Note that the surgical assistant was medical necessity for this procedure to do it in a safe and expeditious manner.  Surgical assistance necessary for retraction of vital neurovascular structures and ligaments and also to help position the limb for safe removal of the old prosthesis, and then safe and accurate placement of the new prosthesis.     [name], M.D.     FA/MEDQ  D:  02/06/2016  T:  02/07/2016  Job:  [id number]

## 2016-02-07 NOTE — Care Management Note (Addendum)
Case Management Note  Patient Details  Name: Jesse Bowers MRN: [id number] Date of Birth: [birth year]  Subjective/Objective:                  LEFT TOTAL KNEE REVISION (Left) Action/Plan: Discharge planning Expected Discharge Date:                  Expected Discharge Plan:  [place]  In-House Referral:     Discharge planning Services  CM Consult  Post Acute Care Choice:  Home Health Choice offered to:  Patient, Parent  DME Arranged: hospital bed, crutches DME Agency:  [facility]  HH Arranged:  PT [place] Agency:  [facility] ([facility])  Status of Service:  Completed, signed off  If discussed at [employer], dates discussed:    Additional Comments: CM met with pt and mother of pt in room to offer choice of home health agency. Family choose [facility] to render HHPT.  Pt states he has rolling walker and 3n1 and needs a hospital bed and crutches. CM notified [place] DME Rep to please arrange for hospital bed   [facility].  No other CM needs were communicated. Dellie Catholic, RN 02/07/2016, 2:59 PM

## 2016-02-07 NOTE — Progress Notes (Deleted)
Pt discharged home. Instructions, precautions, and medications, and follow-up appointments discussed with patient and wife. Prescriptions given to patient. Rolling walker delivered to room prior to d/c. Patient medicated for pain prior to d/c. No questions or concerns from patient or wife. Ready to be discharge home.

## 2016-02-07 NOTE — Progress Notes (Signed)
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  Follow Up Recommendations  Home health PT     Equipment Recommendations  None recommended by PT    Recommendations for Other Services OT consult     Precautions / Restrictions Precautions Precautions: Knee;Fall Required Braces or Orthoses: Knee Immobilizer - Left Knee Immobilizer - Left: Discontinue once straight leg raise with < 10 degree lag Restrictions Weight Bearing Restrictions: No Other Position/Activity Restrictions: WBAT    Mobility  Bed Mobility Overal bed mobility: Needs Assistance Bed Mobility: Sit to Supine     Supine to sit: Min assist Sit to supine: Min assist   General bed mobility comments: cues for sequence and use of R LE to self assist  Transfers Overall transfer level: Needs assistance Equipment used: Rolling walker (2 wheeled) Transfers: Sit to/from Stand Sit to Stand: Min guard         General transfer comment: cues for LE management and use of UEs to self assist  Ambulation/Gait Ambulation/Gait assistance: Min guard Ambulation Distance (Feet): 123 Feet Assistive device: Rolling walker (2 wheeled) Gait Pattern/deviations: Step-to pattern;Decreased step length - right;Decreased step length - left;Shuffle;Trunk flexed Gait velocity: decr Gait velocity interpretation: Below normal speed for age/gender General Gait Details: cues for sequence, posture and position from [employer]            Wheelchair Mobility    Modified Rankin (Stroke Patients Only)       Balance                                    Cognition Arousal/Alertness: Awake/alert Behavior During Therapy: WFL for tasks  assessed/performed Overall Cognitive Status: Within Functional Limits for tasks assessed                      Exercises Total Joint Exercises Ankle Circles/Pumps: AROM;Both;15 reps;Supine Quad Sets: AROM;Both;10 reps;Supine Heel Slides: AAROM;Left;15 reps;Supine Straight Leg Raises: AAROM;Left;10 reps;Supine    General Comments        Pertinent Vitals/Pain Pain Assessment: 0-10 Pain Score: 6  Pain Location: L knee Pain Descriptors / Indicators: Aching;Sore Pain Intervention(s): Limited activity within patient's tolerance;Monitored during session;Premedicated before session;Ice applied    Home Living Family/patient expects to be discharged to:: Private residence Living Arrangements: Spouse/significant other Available Help at Discharge: Family Type of Home: House Home Access: Stairs to enter   Home Layout: Two level Home Equipment: Environmental consultant - 2 wheels;Crutches;Wheelchair - manual      Prior Function Level of Independence: Independent with assistive device(s)      Comments: Ltd ambulation 2* pain    PT Goals (current goals can now be found in the care plan section) Acute Rehab PT Goals Patient Stated Goal: Regain IND PT Goal Formulation: With patient Time For Goal Achievement: 02/11/16 Potential to Achieve Goals: Good Progress towards PT goals: Progressing toward goals    Frequency    7X/week      PT Plan Current plan remains appropriate    Co-evaluation             End of Session Equipment Utilized During Treatment: Gait  belt;Left knee immobilizer Activity Tolerance: Patient tolerated treatment well Patient left: in bed;with call bell/phone within reach;with family/visitor present     Time: 3601-6580 PT Time Calculation (min) (ACUTE ONLY): 24 min  Charges:  $Gait Training: 23-37 mins $Therapeutic Exercise: 8-22 mins                    G Codes:      Jesse Bowers 02-14-16, 3:24 PM

## 2016-02-07 NOTE — Progress Notes (Signed)
Subjective: 1 Day Post-Op Procedure(s) (LRB): LEFT TOTAL KNEE REVISION (Left) Patient reports pain as mild and moderate.   Patient seen in rounds for Dr. Wynelle Link. Doing okay this morning.  Pain thru the night and not much rest. Patient is well, but has had some minor complaints of pain in the knee, requiring pain medications We will start therapy today. Starting to work with therapy after morning rounds. Plan is to go Home after hospital stay.  Objective: Vital signs in last 24 hours: Temp:  [97.5 F (36.4 C)-98.9 F (37.2 C)] 97.9 F (36.6 C) (12/07 0600) Pulse Rate:  [89-101] 89 (12/07 0600) Resp:  [12-29] 18 (12/07 0600) BP: (110-146)/(66-134) 110/68 (12/07 0600) SpO2:  [97 %-100 %] 99 % (12/07 0600) Weight:  [108.4 kg (239 lb)] 108.4 kg (239 lb) (12/06 2137)  Intake/Output from previous day:  Intake/Output Summary (Last 24 hours) at 02/07/16 0855 Last data filed at 02/07/16 0700  Gross per 24 hour  Intake             4320 ml  Output             5310 ml  Net             -990 ml    Intake/Output this shift: No intake/output data recorded.  Labs:  Recent Labs  02/07/16 0408  HGB 10.9*    Recent Labs  02/07/16 0408  WBC 6.5  RBC 4.10*  HCT 34.5*  PLT 166    Recent Labs  02/07/16 0408  NA 135  K 4.2  CL 101  CO2 29  BUN 17  CREATININE 1.08  GLUCOSE 175*  CALCIUM 8.4*   No results for input(s): LABPT, INR in the last 72 hours.  EXAM General - Patient is Alert, Appropriate and Oriented Extremity - Neurovascular intact Sensation intact distally Intact pulses distally Dorsiflexion/Plantar flexion intact Dressing - dressing C/D/I Motor Function - intact, moving foot and toes well on exam.  Hemovac still with fair amount of output.  Drain left in at this time.  Past Medical History:  Diagnosis Date  . Abnormal weight loss   . Anxiety    PTSD per patient  . Celiac disease   . Clotting disorder ([place])    Dr. Anne Fu  . Gender bias    prefers Male gender identity- "remains with vagina"  . Gluten enteropathy   . H/O parotitis    right   . H/O protein C deficiency   . Hx-TIA (transient ischemic attack)    2015  . Neck pain   . Neuromuscular disorder ([place])    bilateral neuropathy feet.  . Polycythemia, secondary   . PONV (postoperative nausea and vomiting)   . psoriatic arthritis   . Sleep apnea    cpap use- Dr. [name] follows  . Stroke [facility])    Stroke left side -slight right sided weakness-Dr. [name] follows  . Syrinx of spinal cord ([place]) 01/06/2014 on MRI   c spine  . Transfusion history    past history- none recent  . Transgendered     Assessment/Plan: 1 Day Post-Op Procedure(s) (LRB): LEFT TOTAL KNEE REVISION (Left) Principal Problem:   Failed total knee arthroplasty, sequela Active Problems:   OA (osteoarthritis) of knee  Estimated body mass index is 35.81 kg/m as calculated from the following:   Height as of this encounter: 5' 8.5" (1.74 m).   Weight as of this encounter: 108.4 kg (239 lb). Advance diet Up with therapy Plan for  discharge tomorrow Discharge home with home health  DVT Prophylaxis - Xarelto 10 mg daily while here in house.  Will resume the 20 mg dosing following discharge at home. Weight-Bearing as tolerated to left leg D/C O2 and Pulse OX and try on Room Air  Arlee Muslim, PA-C Orthopaedic Surgery 02/07/2016, 8:55 AM

## 2016-02-07 NOTE — Progress Notes (Signed)
OT Cancellation Note  Patient Details Name: Jesse Bowers MRN: [id number] DOB: 29-Nov-1965   Cancelled Treatment:    Discussed role of OT - pt feels he doesn't need OT at this time.  Kari Baars, [place] Payton Mccallum D 02/07/2016, 10:44 AM

## 2016-02-08 LAB — BASIC METABOLIC PANEL
ANION GAP: 4 — AB (ref 5–15)
BUN: 19 mg/dL (ref 6–20)
CALCIUM: 8.2 mg/dL — AB (ref 8.9–10.3)
CO2: 31 mmol/L (ref 22–32)
Chloride: 105 mmol/L (ref 101–111)
Creatinine, Ser: 1.09 mg/dL (ref 0.61–1.24)
Glucose, Bld: 125 mg/dL — ABNORMAL HIGH (ref 65–99)
Potassium: 4.1 mmol/L (ref 3.5–5.1)
Sodium: 140 mmol/L (ref 135–145)

## 2016-02-08 LAB — CBC
HEMATOCRIT: 31.7 % — AB (ref 39.0–52.0)
Hemoglobin: 10.1 g/dL — ABNORMAL LOW (ref 13.0–17.0)
MCH: 26.7 pg (ref 26.0–34.0)
MCHC: 31.9 g/dL (ref 30.0–36.0)
MCV: 83.9 fL (ref 78.0–100.0)
PLATELETS: 170 10*3/uL (ref 150–400)
RBC: 3.78 MIL/uL — ABNORMAL LOW (ref 4.22–5.81)
RDW: 14.9 % (ref 11.5–15.5)
WBC: 7.3 10*3/uL (ref 4.0–10.5)

## 2016-02-08 MED ORDER — METHOCARBAMOL 500 MG PO TABS: 500.0000 mg | ORAL_TABLET | Freq: Four times a day (QID) | ORAL | 0 refills | Status: DC | PRN

## 2016-02-08 MED ORDER — OXYCODONE HCL 5 MG PO TABS: 5.0000 mg | ORAL_TABLET | ORAL | 0 refills | Status: DC | PRN

## 2016-02-08 NOTE — Discharge Instructions (Addendum)
Dr. [name] Total Joint Specialist [facility] [address] [place], [place] [address] [phone]  TOTAL KNEE REPLACEMENT POSTOPERATIVE DIRECTIONS  Knee Rehabilitation, Guidelines Following Surgery  Results after knee surgery are often greatly improved when you follow the exercise, range of motion and muscle strengthening exercises prescribed by your doctor. Safety measures are also important to protect the knee from further injury. Any time any of these exercises cause you to have increased pain or swelling in your knee joint, decrease the amount until you are comfortable again and slowly increase them. If you have problems or questions, call your caregiver or physical therapist for advice.   HOME CARE INSTRUCTIONS  Remove items at home which could result in a fall. This includes throw rugs or furniture in walking pathways.   ICE to the affected knee every three hours for 30 minutes at a time and then as needed for pain and swelling.  Continue to use ice on the knee for pain and swelling from surgery. You may notice swelling that will progress down to the foot and ankle.  This is normal after surgery.  Elevate the leg when you are not up walking on it.    Continue to use the breathing machine which will help keep your temperature down.  It is common for your temperature to cycle up and down following surgery, especially at night when you are not up moving around and exerting yourself.  The breathing machine keeps your lungs expanded and your temperature down.  Do not place pillow under knee, focus on keeping the knee straight while resting  DIET You may resume your previous home diet once your are discharged from the hospital.  DRESSING / WOUND CARE / SHOWERING You may shower 3 days after surgery, but keep the wounds dry during showering.  You may use an occlusive plastic wrap (Press'n Seal for example), NO SOAKING/SUBMERGING IN THE BATHTUB.  If the  bandage gets wet, change with a clean dry gauze.  If the incision gets wet, pat the wound dry with a clean towel. You may start showering once you are discharged home but do not submerge the incision under water. Just pat the incision dry and apply a dry gauze dressing on daily. Change the surgical dressing daily and reapply a dry dressing each time.  ACTIVITY Walk with your walker as instructed. Use walker as long as suggested by your caregivers. Avoid periods of inactivity such as sitting longer than an hour when not asleep. This helps prevent blood clots.  You may resume a sexual relationship in one month or when given the OK by your doctor.  You may return to work once you are cleared by your doctor.  Do not drive a car for 6 weeks or until released by you surgeon.  Do not drive while taking narcotics.  WEIGHT BEARING Weight bearing as tolerated with assist device (walker, cane, etc) as directed, use it as long as suggested by your surgeon or therapist, typically at least 4-6 weeks.  POSTOPERATIVE CONSTIPATION PROTOCOL Constipation - defined medically as fewer than three stools per week and severe constipation as less than one stool per week.  One of the most common issues patients have following surgery is constipation.  Even if you have a regular bowel pattern at home, your normal regimen is likely to be disrupted due to multiple reasons following surgery.  Combination of anesthesia, postoperative narcotics, change in appetite and fluid intake all can affect your bowels.  In order to avoid complications following surgery, here are some recommendations in order to help you during your recovery period.  Colace (docusate) - Pick up an over-the-counter form of Colace or another stool softener and take twice a day as long as you are requiring postoperative pain medications.  Take with a full glass of water daily.  If you experience loose stools or diarrhea, hold the colace until you stool forms  back up.  If your symptoms do not get better within 1 week or if they get worse, check with your doctor.  Dulcolax (bisacodyl) - Pick up over-the-counter and take as directed by the product packaging as needed to assist with the movement of your bowels.  Take with a full glass of water.  Use this product as needed if not relieved by Colace only.   MiraLax (polyethylene glycol) - Pick up over-the-counter to have on hand.  MiraLax is a solution that will increase the amount of water in your bowels to assist with bowel movements.  Take as directed and can mix with a glass of water, juice, soda, coffee, or tea.  Take if you go more than two days without a movement. Do not use MiraLax more than once per day. Call your doctor if you are still constipated or irregular after using this medication for 7 days in a row.  If you continue to have problems with postoperative constipation, please contact the office for further assistance and recommendations.  If you experience "the worst abdominal pain ever" or develop nausea or vomiting, please contact the office immediatly for further recommendations for treatment.  ITCHING  If you experience itching with your medications, try taking only a single pain pill, or even half a pain pill at a time.  You can also use Benadryl over the counter for itching or also to help with sleep.   TED HOSE STOCKINGS Wear the elastic stockings on both legs for three weeks following surgery during the day but you may remove then at night for sleeping.  MEDICATIONS See your medication summary on the After Visit Summary that the nursing staff will review with you prior to discharge.  You may have some home medications which will be placed on hold until you complete the course of blood thinner medication.  It is important for you to complete the blood thinner medication as prescribed by your surgeon.  Continue your approved medications as instructed at time of  discharge.  PRECAUTIONS If you experience chest pain or shortness of breath - call 911 immediately for transfer to the hospital emergency department.  If you develop a fever greater that 101 F, purulent drainage from wound, increased redness or drainage from wound, foul odor from the wound/dressing, or calf pain - CONTACT YOUR SURGEON.                                                   FOLLOW-UP APPOINTMENTS Make sure you keep all of your appointments after your operation with your surgeon and caregivers. You should call the office at the above phone number and make an appointment for approximately two weeks after the date of your surgery or on the date instructed by your surgeon outlined in the "After Visit Summary".   RANGE OF MOTION AND STRENGTHENING EXERCISES  Rehabilitation of the knee is important following a knee injury or  an operation. After just a few days of immobilization, the muscles of the thigh which control the knee become weakened and shrink (atrophy). Knee exercises are designed to build up the tone and strength of the thigh muscles and to improve knee motion. Often times heat used for twenty to thirty minutes before working out will loosen up your tissues and help with improving the range of motion but do not use heat for the first two weeks following surgery. These exercises can be done on a training (exercise) mat, on the floor, on a table or on a bed. Use what ever works the best and is most comfortable for you Knee exercises include:  Leg Lifts - While your knee is still immobilized in a splint or cast, you can do straight leg raises. Lift the leg to 60 degrees, hold for 3 sec, and slowly lower the leg. Repeat 10-20 times 2-3 times daily. Perform this exercise against resistance later as your knee gets better.  Quad and Hamstring Sets - Tighten up the muscle on the front of the thigh (Quad) and hold for 5-10 sec. Repeat this 10-20 times hourly. Hamstring sets are done by pushing the  foot backward against an object and holding for 5-10 sec. Repeat as with quad sets.   Leg Slides: Lying on your back, slowly slide your foot toward your buttocks, bending your knee up off the floor (only go as far as is comfortable). Then slowly slide your foot back down until your leg is flat on the floor again.  Angel Wings: Lying on your back spread your legs to the side as far apart as you can without causing discomfort.  A rehabilitation program following serious knee injuries can speed recovery and prevent re-injury in the future due to weakened muscles. Contact your doctor or a physical therapist for more information on knee rehabilitation.   IF YOU ARE TRANSFERRED TO A SKILLED REHAB FACILITY If the patient is transferred to a skilled rehab facility following release from the hospital, a list of the current medications will be sent to the facility for the patient to continue.  When discharged from the skilled rehab facility, please have the facility set up the patient's [place] prior to being released. Also, the skilled facility will be responsible for providing the patient with their medications at time of release from the facility to include their pain medication, the muscle relaxants, and their blood thinner medication. If the patient is still at the rehab facility at time of the two week follow up appointment, the skilled rehab facility will also need to assist the patient in arranging follow up appointment in our office and any transportation needs.  MAKE SURE YOU:  Understand these instructions.  Get help right away if you are not doing well or get worse.    Pick up stool softner and laxative for home use following surgery while on pain medications. Do not submerge incision under water. Please use good hand washing techniques while changing dressing each day. May shower starting three days after surgery. Please use a clean towel to pat the incision dry following  showers. Continue to use ice for pain and swelling after surgery. Do not use any lotions or creams on the incision until instructed by your surgeon.   Resume the Xarelto 20 mg daily at home tomorrow (Saturday 02/09/2016) following discharge.

## 2016-02-08 NOTE — Progress Notes (Signed)
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  Reviewed therex, don/doff KI, and stairs with written information provided.  Follow Up Recommendations  Home health PT     Equipment Recommendations  None recommended by PT    Recommendations for Other Services OT consult     Precautions / Restrictions Precautions Precautions: Knee;Fall Required Braces or Orthoses: Knee Immobilizer - Left Knee Immobilizer - Left: Discontinue once straight leg raise with < 10 degree lag Restrictions Weight Bearing Restrictions: No Other Position/Activity Restrictions: WBAT    Mobility  Bed Mobility Overal bed mobility: Needs Assistance Bed Mobility: Supine to Sit     Supine to sit: Supervision        Transfers Overall transfer level: Needs assistance Equipment used: Rolling walker (2 wheeled) Transfers: Sit to/from Stand Sit to Stand: Supervision         General transfer comment: min cues for LE management and use of UEs to self assist  Ambulation/Gait Ambulation/Gait assistance: Min guard;Supervision Ambulation Distance (Feet): 150 Feet Assistive device: Rolling walker (2 wheeled) Gait Pattern/deviations: Step-to pattern;Decreased step length - right;Decreased step length - left;Shuffle;Trunk flexed Gait velocity: decr Gait velocity interpretation: Below normal speed for age/gender General Gait Details: min cues for sequence, posture and position from RW   Stairs Stairs: Yes   Stair Management: No rails;One rail Right;Step to pattern;Forwards;With walker;With crutches Number of Stairs: 10 General stair comments: 8 stairs fwd with rail and crutch and 2 single steps fwd with RW; cues for sequence and foot/RW/crutch  placement  Wheelchair Mobility    Modified Rankin (Stroke Patients Only)       Balance                                    Cognition Arousal/Alertness: Awake/alert Behavior During Therapy: WFL for tasks assessed/performed Overall Cognitive Status: Within Functional Limits for tasks assessed                      Exercises Total Joint Exercises Ankle Circles/Pumps: AROM;Both;15 reps;Supine Quad Sets: AROM;Both;Supine;20 reps Heel Slides: AAROM;Left;Supine;20 reps Straight Leg Raises: AAROM;Left;Supine;20 reps Goniometric ROM: AAROM at L knee -10 - 75    General Comments        Pertinent Vitals/Pain Pain Assessment: 0-10 Pain Score: 6  Pain Location: L knee Pain Descriptors / Indicators: Aching;Sore Pain Intervention(s): Limited activity within patient's tolerance;Monitored during session;Premedicated before session;Ice applied    Home Living                      Prior Function            PT Goals (current goals can now be found in the care plan section) Acute Rehab PT Goals Patient Stated Goal: Regain IND PT Goal Formulation: With patient Time For Goal Achievement: 02/11/16 Potential to Achieve Goals: Good Progress towards PT goals: Progressing toward goals    Frequency    7X/week      PT Plan Current plan remains appropriate    Co-evaluation             End of Session Equipment Utilized During Treatment: Gait  belt;Left knee immobilizer Activity Tolerance: Patient tolerated treatment well Patient left: in chair;with call bell/phone within reach     Time: 7308-1683 PT Time Calculation (min) (ACUTE ONLY): 53 min  Charges:  $Gait Training: 23-37 mins $Therapeutic Exercise: 23-37 mins                    G Codes:      Jesse Bowers 2016/02/09, 1:00 PM

## 2016-02-08 NOTE — Discharge Summary (Signed)
Physician Discharge Summary   Patient ID: Jesse Bowers MRN: [id number] DOB/AGE: Feb 09, 1966 50 y.o.  Admit date: 02/06/2016 Discharge date: 02-08-2016  Primary Diagnosis:  Unstable left total knee arthroplasty.  Admission Diagnoses:  Past Medical History:  Diagnosis Date  . Abnormal weight loss   . Anxiety    PTSD per patient  . Celiac disease   . Clotting disorder ([place])    Dr. Anne Fu  . Gender bias    prefers Male gender identity- "remains with vagina"  . Gluten enteropathy   . H/O parotitis    right   . H/O protein C deficiency   . Hx-TIA (transient ischemic attack)    2015  . Neck pain   . Neuromuscular disorder ([place])    bilateral neuropathy feet.  . Polycythemia, secondary   . PONV (postoperative nausea and vomiting)   . psoriatic arthritis   . Sleep apnea    cpap use- Dr. [name] follows  . Stroke [facility])    Stroke left side -slight right sided weakness-Dr. [name] follows  . Syrinx of spinal cord ([place]) 01/06/2014 on MRI   c spine  . Transfusion history    past history- none recent  . Transgendered    Discharge Diagnoses:   Principal Problem:   Failed total knee arthroplasty, sequela Active Problems:   OA (osteoarthritis) of knee  Estimated body mass index is 35.81 kg/m as calculated from the following:   Height as of this encounter: 5' 8.5" (1.74 m).   Weight as of this encounter: 108.4 kg (239 lb).  Procedure:  Procedure(s) (LRB): LEFT TOTAL KNEE REVISION (Left)   Consults: None  HPI: Jesse Bowers is a 50 year old male with a painful unstable left total knee.  He presents now for left total knee arthroplasty revision.  Laboratory Data: Admission on 02/06/2016  Component Date Value Ref Range Status  . WBC 02/07/2016 6.5  4.0 - 10.5 K/uL Final  . RBC 02/07/2016 4.10* 4.22 - 5.81 MIL/uL Final  . Hemoglobin 02/07/2016 10.9* 13.0 - 17.0 g/dL Final  . HCT 02/07/2016 34.5* 39.0 - 52.0 % Final  . MCV 02/07/2016 84.1  78.0 - 100.0 fL Final  . MCH 02/07/2016  26.6  26.0 - 34.0 pg Final  . MCHC 02/07/2016 31.6  30.0 - 36.0 g/dL Final  . RDW 02/07/2016 14.2  11.5 - 15.5 % Final  . Platelets 02/07/2016 166  150 - 400 K/uL Final  . Sodium 02/07/2016 135  135 - 145 mmol/L Final  . Potassium 02/07/2016 4.2  3.5 - 5.1 mmol/L Final  . Chloride 02/07/2016 101  101 - 111 mmol/L Final  . CO2 02/07/2016 29  22 - 32 mmol/L Final  . Glucose, Bld 02/07/2016 175* 65 - 99 mg/dL Final  . BUN 02/07/2016 17  6 - 20 mg/dL Final  . Creatinine, Ser 02/07/2016 1.08  0.61 - 1.24 mg/dL Final  . Calcium 02/07/2016 8.4* 8.9 - 10.3 mg/dL Final  . GFR calc non Af Amer 02/07/2016 >60  >60 mL/min Final  . GFR calc Af Amer 02/07/2016 >60  >60 mL/min Final   Comment: (NOTE) The eGFR has been calculated using the CKD EPI equation. This calculation has not been validated in all clinical situations. eGFR's persistently <60 mL/min signify possible Chronic Kidney Disease.   . Anion gap 02/07/2016 5  5 - 15 Final  . WBC 02/08/2016 7.3  4.0 - 10.5 K/uL Final  . RBC 02/08/2016 3.78* 4.22 - 5.81 MIL/uL Final  . Hemoglobin 02/08/2016 10.1* 13.0 -  17.0 g/dL Final  . HCT 02/08/2016 31.7* 39.0 - 52.0 % Final  . MCV 02/08/2016 83.9  78.0 - 100.0 fL Final  . MCH 02/08/2016 26.7  26.0 - 34.0 pg Final  . MCHC 02/08/2016 31.9  30.0 - 36.0 g/dL Final  . RDW 02/08/2016 14.9  11.5 - 15.5 % Final  . Platelets 02/08/2016 170  150 - 400 K/uL Final  . Sodium 02/08/2016 140  135 - 145 mmol/L Final  . Potassium 02/08/2016 4.1  3.5 - 5.1 mmol/L Final  . Chloride 02/08/2016 105  101 - 111 mmol/L Final  . CO2 02/08/2016 31  22 - 32 mmol/L Final  . Glucose, Bld 02/08/2016 125* 65 - 99 mg/dL Final  . BUN 02/08/2016 19  6 - 20 mg/dL Final  . Creatinine, Ser 02/08/2016 1.09  0.61 - 1.24 mg/dL Final  . Calcium 02/08/2016 8.2* 8.9 - 10.3 mg/dL Final  . GFR calc non Af Amer 02/08/2016 >60  >60 mL/min Final  . GFR calc Af Amer 02/08/2016 >60  >60 mL/min Final   Comment: (NOTE) The eGFR has been  calculated using the CKD EPI equation. This calculation has not been validated in all clinical situations. eGFR's persistently <60 mL/min signify possible Chronic Kidney Disease.   Georgiann Hahn gap 02/08/2016 4* 5 - 15 Final  Hospital Outpatient Visit on 02/01/2016  Component Date Value Ref Range Status  . MRSA, PCR 02/01/2016 NEGATIVE  NEGATIVE Final  . Staphylococcus aureus 02/01/2016 NEGATIVE  NEGATIVE Final   Comment:        The Xpert SA Assay (FDA approved for NASAL specimens in patients over 14 years of age), is one component of a comprehensive surveillance program.  Test performance has been validated by [facility] for patients greater than or equal to 89 year old. It is not intended to diagnose infection nor to guide or monitor treatment.   [name] aPTT 02/01/2016 28  24 - 36 seconds Final  . WBC 02/01/2016 4.4  4.0 - 10.5 K/uL Final  . RBC 02/01/2016 5.17  4.22 - 5.81 MIL/uL Final  . Hemoglobin 02/01/2016 13.5  13.0 - 17.0 g/dL Final  . HCT 02/01/2016 42.4  39.0 - 52.0 % Final  . MCV 02/01/2016 82.0  78.0 - 100.0 fL Final  . MCH 02/01/2016 26.1  26.0 - 34.0 pg Final  . MCHC 02/01/2016 31.8  30.0 - 36.0 g/dL Final  . RDW 02/01/2016 14.4  11.5 - 15.5 % Final  . Platelets 02/01/2016 175  150 - 400 K/uL Final  . Sodium 02/01/2016 137  135 - 145 mmol/L Final  . Potassium 02/01/2016 4.6  3.5 - 5.1 mmol/L Final  . Chloride 02/01/2016 102  101 - 111 mmol/L Final  . CO2 02/01/2016 28  22 - 32 mmol/L Final  . Glucose, Bld 02/01/2016 76  65 - 99 mg/dL Final  . BUN 02/01/2016 14  6 - 20 mg/dL Final  . Creatinine, Ser 02/01/2016 1.09  0.61 - 1.24 mg/dL Final  . Calcium 02/01/2016 8.8* 8.9 - 10.3 mg/dL Final  . Total Protein 02/01/2016 6.8  6.5 - 8.1 g/dL Final  . Albumin 02/01/2016 4.2  3.5 - 5.0 g/dL Final  . AST 02/01/2016 49* 15 - 41 U/L Final  . ALT 02/01/2016 64* 17 - 63 U/L Final  . Alkaline Phosphatase 02/01/2016 224* 38 - 126 U/L Final  . Total Bilirubin 02/01/2016 0.5  0.3 -  1.2 mg/dL Final  . GFR calc non Af Amer 02/01/2016 >60  >  60 mL/min Final  . GFR calc Af Amer 02/01/2016 >60  >60 mL/min Final   Comment: (NOTE) The eGFR has been calculated using the CKD EPI equation. This calculation has not been validated in all clinical situations. eGFR's persistently <60 mL/min signify possible Chronic Kidney Disease.   . Anion gap 02/01/2016 7  5 - 15 Final  . Prothrombin Time 02/01/2016 12.8  11.4 - 15.2 seconds Final  . INR 02/01/2016 0.96   Final  . ABO/RH(D) 02/06/2016 A POS   Final  . Antibody Screen 02/06/2016 NEG   Final  . Sample Expiration 02/06/2016 02/09/2016   Final  . Extend sample reason 02/06/2016 NO TRANSFUSIONS OR PREGNANCY IN THE PAST 3 MONTHS   Final  . Color, Urine 02/01/2016 YELLOW  YELLOW Final  . APPearance 02/01/2016 CLEAR  CLEAR Final  . Specific Gravity, Urine 02/01/2016 1.011  1.005 - 1.030 Final  . pH 02/01/2016 7.0  5.0 - 8.0 Final  . Glucose, UA 02/01/2016 NEGATIVE  NEGATIVE mg/dL Final  . Hgb urine dipstick 02/01/2016 NEGATIVE  NEGATIVE Final  . Bilirubin Urine 02/01/2016 NEGATIVE  NEGATIVE Final  . Ketones, ur 02/01/2016 NEGATIVE  NEGATIVE mg/dL Final  . Protein, ur 02/01/2016 NEGATIVE  NEGATIVE mg/dL Final  . Nitrite 02/01/2016 NEGATIVE  NEGATIVE Final  . Leukocytes, UA 02/01/2016 NEGATIVE  NEGATIVE Final  . ABO/RH(D) 02/01/2016 A POS   Final     X-Rays:No results found.  EKG: Orders placed or performed during the hospital encounter of 10/30/15  . ED EKG  . ED EKG  . EKG 12-Lead  . EKG 12-Lead  . EKG     Hospital Course: Inmer Nix is a 50 y.o. who was admitted to [facility]. They were brought to the operating room on 02/06/2016 and underwent Procedure(s): LEFT TOTAL KNEE REVISION.  Patient tolerated the procedure well and was later transferred to the recovery room and then to the orthopaedic floor for postoperative care.  They were given PO and IV analgesics for pain control following their surgery.  They  were given 24 hours of postoperative antibiotics of  Anti-infectives    Start     Dose/Rate Route Frequency Ordered Stop   02/06/16 1800  clindamycin (CLEOCIN) IVPB 600 mg     600 mg 100 mL/hr over 30 Minutes Intravenous Every 6 hours 02/06/16 1500 02/07/16 0136   02/06/16 0936  clindamycin (CLEOCIN) IVPB 900 mg     900 mg 100 mL/hr over 30 Minutes Intravenous On call to O.R. 02/06/16 6384 02/06/16 1125     and started on DVT prophylaxis in the form of Xarelto.   PT and OT were ordered for total joint protocol.  Discharge planning consulted to help with postop disposition and equipment needs.  Patient had a tough night on the evening of surgery with pain.  They started to get up OOB with therapy on day one. Hemovac drain was left in place on day one with continued drainage.  Continued to work with therapy into day two.  Dressing was changed on day two and the incision was healing well. Patient was seen in rounds by Dr. Wynelle Link, the hemovac drain was pulled without difficulty and was they were ready to go home following theraoy  Discharge home with home health Diet - Cardiac diet Follow up - in 2 weeks Activity - WBAT Disposition - Home Condition Upon Discharge - Good D/C Meds - See DC Summary DVT Prophylaxis - Xarelto10 mg today and then resume the Xarelto 20  mg daily tomorrow at home.  Discharge Instructions    Call MD / Call 911    Complete by:  As directed    If you experience chest pain or shortness of breath, CALL 911 and be transported to the hospital emergency room.  If you develope a fever above 101 F, pus (white drainage) or increased drainage or redness at the wound, or calf pain, call your surgeon's office.   Change dressing    Complete by:  As directed    Change dressing daily with sterile 4 x 4 inch gauze dressing and apply TED hose. Do not submerge the incision under water.   Constipation Prevention    Complete by:  As directed    Drink plenty of fluids.  Prune juice may  be helpful.  You may use a stool softener, such as Colace (over the counter) 100 mg twice a day.  Use MiraLax (over the counter) for constipation as needed.   Diet - low sodium heart healthy    Complete by:  As directed    Discharge instructions    Complete by:  As directed    Pick up stool softner and laxative for home use following surgery while on pain medications. Do not submerge incision under water. Please use good hand washing techniques while changing dressing each day. May shower starting three days after surgery. Please use a clean towel to pat the incision dry following showers. Continue to use ice for pain and swelling after surgery. Do not use any lotions or creams on the incision until instructed by your surgeon.   Postoperative Constipation Protocol  Constipation - defined medically as fewer than three stools per week and severe constipation as less than one stool per week.  One of the most common issues patients have following surgery is constipation.  Even if you have a regular bowel pattern at home, your normal regimen is likely to be disrupted due to multiple reasons following surgery.  Combination of anesthesia, postoperative narcotics, change in appetite and fluid intake all can affect your bowels.  In order to avoid complications following surgery, here are some recommendations in order to help you during your recovery period.  Colace (docusate) - Pick up an over-the-counter form of Colace or another stool softener and take twice a day as long as you are requiring postoperative pain medications.  Take with a full glass of water daily.  If you experience loose stools or diarrhea, hold the colace until you stool forms back up.  If your symptoms do not get better within 1 week or if they get worse, check with your doctor.  Dulcolax (bisacodyl) - Pick up over-the-counter and take as directed by the product packaging as needed to assist with the movement of your bowels.  Take  with a full glass of water.  Use this product as needed if not relieved by Colace only.   MiraLax (polyethylene glycol) - Pick up over-the-counter to have on hand.  MiraLax is a solution that will increase the amount of water in your bowels to assist with bowel movements.  Take as directed and can mix with a glass of water, juice, soda, coffee, or tea.  Take if you go more than two days without a movement. Do not use MiraLax more than once per day. Call your doctor if you are still constipated or irregular after using this medication for 7 days in a row.  If you continue to have problems with postoperative constipation, please contact the  office for further assistance and recommendations.  If you experience "the worst abdominal pain ever" or develop nausea or vomiting, please contact the office immediatly for further recommendations for treatment.   Resume the Xarelto 20 mg daily at home tomorrow (Saturday 02/09/2016) following discharge.    Do not put a pillow under the knee. Place it under the heel.    Complete by:  As directed    Do not sit on low chairs, stoools or toilet seats, as it may be difficult to get up from low surfaces    Complete by:  As directed    Driving restrictions    Complete by:  As directed    No driving until released by the physician.   Increase activity slowly as tolerated    Complete by:  As directed    Lifting restrictions    Complete by:  As directed    No lifting until released by the physician.   Patient may shower    Complete by:  As directed    You may shower without a dressing once there is no drainage.  Do not wash over the wound.  If drainage remains, do not shower until drainage stops.   TED hose    Complete by:  As directed    Use stockings (TED hose) for 3 weeks on both leg(s).  You may remove them at night for sleeping.   Weight bearing as tolerated    Complete by:  As directed    Laterality:  left   Extremity:  Lower       Medication List      STOP taking these medications   diclofenac sodium 1 % Gel Commonly known as:  VOLTAREN     TAKE these medications   calcium carbonate 750 MG chewable tablet Commonly known as:  TUMS EX Chew 2 tablets by mouth daily as needed for heartburn.   clindamycin 150 MG capsule Commonly known as:  CLEOCIN Take 450 mg by mouth as needed (DENTAL APPT). Reported on 09/14/2015   clobetasol ointment 0.05 % Commonly known as:  TEMOVATE Apply 1 application topically 2 (two) times daily as needed (psoriasis).   clorazepate 15 MG tablet Commonly known as:  TRANXENE Take 7.5-15 mg by mouth. Take 7.5 morning and afternoon, and then take 40ms at bedtime   cyclobenzaprine 5 MG tablet Commonly known as:  FLEXERIL Take 52mat bedtime, as needed.   desonide 0.05 % ointment Commonly known as:  DESOWEN APPLY TO AFFECTED AREAS TWICE DAILY AS NEEDED FOR PSORIASIS.   ENSTILAR 0.005-0.064 % Foam Generic drug:  Calcipotriene-Betameth Diprop Apply 1 application topically at bedtime.   fluticasone 50 MCG/ACT nasal spray Commonly known as:  FLONASE Place 1 spray into both nostrils daily.   methocarbamol 500 MG tablet Commonly known as:  ROBAXIN Take 1 tablet (500 mg total) by mouth every 6 (six) hours as needed for muscle spasms.   morphine 60 MG 24 hr capsule Commonly known as:  KADIAN Take 1 capsule (60 mg total) by mouth every 12 (twelve) hours.   NONFORMULARY OR COMPOUNDED ITEM Apply 1 application topically 3 (three) times daily. 8% ketamine, 5% amitriptyline, 5% baclofen, 5% gabapentin  60 GM What changed:  when to take this  additional instructions   oxyCODONE 5 MG immediate release tablet Commonly known as:  Oxy IR/ROXICODONE Take 1-4 tablets (5-20 mg total) by mouth every 4 (four) hours as needed for moderate pain or severe pain. What changed:  medication strength  how  much to take  when to take this  reasons to take this   prazosin 5 MG capsule Commonly known as:   MINIPRESS Take 5 mg by mouth at bedtime.   Secukinumab 150 MG/ML Soaj Inject 300 mg into the skin every 28 (twenty-eight) days. Reported on 08/17/2015   testosterone cypionate 200 MG/ML injection Commonly known as:  DEPO-TESTOSTERONE Inject 0.25 mLs (50 mg total) into the muscle once a week. What changed:  how much to take  additional instructions   venlafaxine XR 150 MG 24 hr capsule Commonly known as:  EFFEXOR-XR Take 300 mg by mouth daily with breakfast.   XARELTO 20 MG Tabs tablet Generic drug:  rivaroxaban TAKE 1 TABLET (20 MG TOTAL) BY MOUTH DAILY WITH SUPPER.            Durable Medical Equipment        Start     Ordered   02/07/16 1526  For home use only DME Crutches  Once     02/07/16 1525   02/07/16 1525  For home use only DME Hospital bed  Once    Question Answer Comment  Patient has (list medical condition): s/p left total knee revison   The above medical condition requires: Patient requires the ability to reposition frequently   Bed type Semi-electric      02/07/16 1526     Follow-up Information    [facility] Follow up.   Specialty:  Home Health Services Why:  home health physical therapy Contact information: [address] [place] [address] [phone]        [facility] Follow up.   Why:  hospital bed Contact information: [address] [place]  [address] [phone]        Gearlean Alf, MD. Schedule an appointment as soon as possible for a visit on 02/19/2016.   Specialty:  Orthopedic Surgery Contact information: [address] [place] [address] [phone]           Signed: Arlee Muslim, PA-C Orthopaedic Surgery 02/08/2016, 8:02 AM

## 2016-02-08 NOTE — Progress Notes (Signed)
Patient has (list medical condition): s/p left total knee revison   The above medical condition requires: Patient requires the ability to reposition frequently   Bed type Semi-electric

## 2016-02-08 NOTE — Progress Notes (Signed)
Subjective: 2 Days Post-Op Procedure(s) (LRB): LEFT TOTAL KNEE REVISION (Left) Patient reports pain as mild.   Patient seen in rounds with Dr. Wynelle Link. Patient is well, but has had some minor complaints of pain in the knee, requiring pain medications Patient is ready to go home  Objective: Vital signs in last 24 hours: Temp:  [98.3 F (36.8 C)-99 F (37.2 C)] 98.3 F (36.8 C) (12/08 0437) Pulse Rate:  [93-103] 93 (12/08 0437) Resp:  [16] 16 (12/08 0437) BP: (115-135)/(73-75) 135/75 (12/08 0437) SpO2:  [96 %-100 %] 98 % (12/08 0437)  Intake/Output from previous day:  Intake/Output Summary (Last 24 hours) at 02/08/16 0754 Last data filed at 02/08/16 0400  Gross per 24 hour  Intake           1336.5 ml  Output             1070 ml  Net            266.5 ml    Intake/Output this shift: No intake/output data recorded.  Labs:  Recent Labs  02/07/16 0408 02/08/16 0406  HGB 10.9* 10.1*    Recent Labs  02/07/16 0408 02/08/16 0406  WBC 6.5 7.3  RBC 4.10* 3.78*  HCT 34.5* 31.7*  PLT 166 170    Recent Labs  02/07/16 0408 02/08/16 0406  NA 135 140  K 4.2 4.1  CL 101 105  CO2 29 31  BUN 17 19  CREATININE 1.08 1.09  GLUCOSE 175* 125*  CALCIUM 8.4* 8.2*   No results for input(s): LABPT, INR in the last 72 hours.  EXAM: General - Patient is Alert, Appropriate and Oriented Extremity - Neurovascular intact Sensation intact distally Dorsiflexion/Plantar flexion intact Incision - clean, dry, no drainage Motor Function - intact, moving foot and toes well on exam.   Assessment/Plan: 2 Days Post-Op Procedure(s) (LRB): LEFT TOTAL KNEE REVISION (Left) Procedure(s) (LRB): LEFT TOTAL KNEE REVISION (Left) Past Medical History:  Diagnosis Date  . Abnormal weight loss   . Anxiety    PTSD per patient  . Celiac disease   . Clotting disorder ([place])    Dr. Anne Fu  . Gender bias    prefers Male gender identity- "remains with vagina"  . Gluten enteropathy   .  H/O parotitis    right   . H/O protein C deficiency   . Hx-TIA (transient ischemic attack)    2015  . Neck pain   . Neuromuscular disorder ([place])    bilateral neuropathy feet.  . Polycythemia, secondary   . PONV (postoperative nausea and vomiting)   . psoriatic arthritis   . Sleep apnea    cpap use- Dr. [name] follows  . Stroke [facility])    Stroke left side -slight right sided weakness-Dr. [name] follows  . Syrinx of spinal cord ([place]) 01/06/2014 on MRI   c spine  . Transfusion history    past history- none recent  . Transgendered    Principal Problem:   Failed total knee arthroplasty, sequela Active Problems:   OA (osteoarthritis) of knee  Estimated body mass index is 35.81 kg/m as calculated from the following:   Height as of this encounter: 5' 8.5" (1.74 m).   Weight as of this encounter: 108.4 kg (239 lb). Up with therapy Discharge home with home health Diet - Cardiac diet Follow up - in 2 weeks Activity - WBAT Disposition - Home Condition Upon Discharge - Good D/C Meds - See DC Summary DVT Prophylaxis - Xarelto10 mg today  and then resume the Xarelto 20 mg daily tomorrow at home.  Jesse Muslim, PA-C Orthopaedic Surgery 02/08/2016, 7:54 AM

## 2016-02-13 ENCOUNTER — Ambulatory Visit: Payer: Self-pay | Admitting: Hematology

## 2016-02-13 ENCOUNTER — Other Ambulatory Visit: Payer: Self-pay

## 2016-02-19 ENCOUNTER — Ambulatory Visit (INDEPENDENT_AMBULATORY_CARE_PROVIDER_SITE_OTHER): Payer: BLUE CROSS/BLUE SHIELD | Admitting: Psychology

## 2016-02-19 DIAGNOSIS — F909 Attention-deficit hyperactivity disorder, unspecified type: Secondary | ICD-10-CM

## 2016-02-19 DIAGNOSIS — F4310 Post-traumatic stress disorder, unspecified: Secondary | ICD-10-CM | POA: Diagnosis not present

## 2016-02-21 ENCOUNTER — Ambulatory Visit: Payer: BLUE CROSS/BLUE SHIELD | Admitting: Psychology

## 2016-03-04 ENCOUNTER — Ambulatory Visit (INDEPENDENT_AMBULATORY_CARE_PROVIDER_SITE_OTHER): Payer: BLUE CROSS/BLUE SHIELD | Admitting: Psychology

## 2016-03-04 DIAGNOSIS — F321 Major depressive disorder, single episode, moderate: Secondary | ICD-10-CM | POA: Diagnosis not present

## 2016-03-04 DIAGNOSIS — F4310 Post-traumatic stress disorder, unspecified: Secondary | ICD-10-CM

## 2016-03-06 ENCOUNTER — Ambulatory Visit (INDEPENDENT_AMBULATORY_CARE_PROVIDER_SITE_OTHER): Payer: BLUE CROSS/BLUE SHIELD | Admitting: Psychology

## 2016-03-06 ENCOUNTER — Encounter: Payer: BLUE CROSS/BLUE SHIELD | Attending: Physical Medicine & Rehabilitation | Admitting: Registered Nurse

## 2016-03-06 ENCOUNTER — Encounter: Payer: Self-pay | Admitting: Registered Nurse

## 2016-03-06 ENCOUNTER — Telehealth: Payer: Self-pay | Admitting: *Deleted

## 2016-03-06 VITALS — BP 119/83 | HR 104 | Resp 14

## 2016-03-06 DIAGNOSIS — R209 Unspecified disturbances of skin sensation: Secondary | ICD-10-CM | POA: Insufficient documentation

## 2016-03-06 DIAGNOSIS — I69398 Other sequelae of cerebral infarction: Secondary | ICD-10-CM | POA: Insufficient documentation

## 2016-03-06 DIAGNOSIS — D6859 Other primary thrombophilia: Secondary | ICD-10-CM | POA: Insufficient documentation

## 2016-03-06 DIAGNOSIS — M7522 Bicipital tendinitis, left shoulder: Secondary | ICD-10-CM | POA: Diagnosis not present

## 2016-03-06 DIAGNOSIS — K900 Celiac disease: Secondary | ICD-10-CM | POA: Diagnosis not present

## 2016-03-06 DIAGNOSIS — G8929 Other chronic pain: Secondary | ICD-10-CM | POA: Diagnosis present

## 2016-03-06 DIAGNOSIS — M7712 Lateral epicondylitis, left elbow: Secondary | ICD-10-CM

## 2016-03-06 DIAGNOSIS — G894 Chronic pain syndrome: Secondary | ICD-10-CM

## 2016-03-06 DIAGNOSIS — Z5181 Encounter for therapeutic drug level monitoring: Secondary | ICD-10-CM

## 2016-03-06 DIAGNOSIS — I69351 Hemiplegia and hemiparesis following cerebral infarction affecting right dominant side: Secondary | ICD-10-CM | POA: Diagnosis not present

## 2016-03-06 DIAGNOSIS — Z79891 Long term (current) use of opiate analgesic: Secondary | ICD-10-CM | POA: Insufficient documentation

## 2016-03-06 DIAGNOSIS — Z9181 History of falling: Secondary | ICD-10-CM | POA: Insufficient documentation

## 2016-03-06 DIAGNOSIS — D751 Secondary polycythemia: Secondary | ICD-10-CM | POA: Diagnosis not present

## 2016-03-06 DIAGNOSIS — F4323 Adjustment disorder with mixed anxiety and depressed mood: Secondary | ICD-10-CM | POA: Diagnosis not present

## 2016-03-06 DIAGNOSIS — Z7901 Long term (current) use of anticoagulants: Secondary | ICD-10-CM | POA: Insufficient documentation

## 2016-03-06 DIAGNOSIS — Z87890 Personal history of sex reassignment: Secondary | ICD-10-CM | POA: Diagnosis not present

## 2016-03-06 DIAGNOSIS — I69320 Aphasia following cerebral infarction: Secondary | ICD-10-CM | POA: Insufficient documentation

## 2016-03-06 DIAGNOSIS — Z96652 Presence of left artificial knee joint: Secondary | ICD-10-CM

## 2016-03-06 DIAGNOSIS — Z86718 Personal history of other venous thrombosis and embolism: Secondary | ICD-10-CM | POA: Insufficient documentation

## 2016-03-06 DIAGNOSIS — L4050 Arthropathic psoriasis, unspecified: Secondary | ICD-10-CM | POA: Diagnosis not present

## 2016-03-06 DIAGNOSIS — M545 Low back pain, unspecified: Secondary | ICD-10-CM

## 2016-03-06 DIAGNOSIS — S43002A Unspecified subluxation of left shoulder joint, initial encounter: Secondary | ICD-10-CM | POA: Insufficient documentation

## 2016-03-06 DIAGNOSIS — F418 Other specified anxiety disorders: Secondary | ICD-10-CM | POA: Diagnosis not present

## 2016-03-06 DIAGNOSIS — M13862 Other specified arthritis, left knee: Secondary | ICD-10-CM | POA: Diagnosis not present

## 2016-03-06 DIAGNOSIS — Z79899 Other long term (current) drug therapy: Secondary | ICD-10-CM

## 2016-03-06 DIAGNOSIS — F4312 Post-traumatic stress disorder, chronic: Secondary | ICD-10-CM

## 2016-03-06 MED ORDER — MORPHINE SULFATE ER 60 MG PO CP24: 60.0000 mg | ORAL_CAPSULE | Freq: Two times a day (BID) | ORAL | 0 refills | Status: DC

## 2016-03-06 NOTE — Progress Notes (Signed)
Subjective:    Patient ID: Jesse Bowers, male    DOB: [birth year], 51 y.o.   MRN: [id number]  HPI: Jesse Bowers is a 51year old male who returns for follow-up for chronic pain and medication refill. He states his pain is located in his left elbow, lower back, left knee and left ankle.He rates his pain 9. His current exercise regime is attending physical therapy twice a week, walking with his walker short distances in his home.  Also states he was stepping off the curb and lost his balance and develop ankle pain. No ecchymosis noted, full ROM.   Jesse Bowers S/P  Left Total Knee Revision on 02/06/2016 by Dr. Wynelle Link Dr. Wynelle Link prescribing Oxycodone, he has a follow up appointment with Dr. Wynelle Link on Friday 03/14/16.  Pain Inventory Average Pain 7 Pain Right Now 9 My pain is constant, sharp, stabbing and aching  In the last 24 hours, has pain interfered with the following? General activity 10 Relation with others 10 Enjoyment of life 10 What TIME of day is your pain at its worst? All Sleep (in general) Poor  Pain is worse with: walking, bending, sitting, inactivity and standing Pain improves with: rest, heat/ice, therapy/exercise, medication and injections Relief from Meds: 8  Mobility use a walker how many minutes can you walk? 5-10 with wallker ability to climb steps?  yes do you drive?  no use a wheelchair needs help with transfers Do you have any goals in this area?  yes  Function disabled: date disabled 01/18/2013 I need assistance with the following:  feeding, dressing, bathing, toileting, meal prep, household duties and shopping  Neuro/Psych bladder control problems weakness numbness tingling trouble walking spasms anxiety  Prior Studies Any changes since last visit?  yes  Physicians involved in your care Any changes since last visit?  no   Family History  Problem Relation Age of Onset  . Stroke Maternal Grandfather     109  . Heart attack  Maternal Grandfather   . Hypertension Mother   . Psoriasis Mother   . Cancer Paternal Grandfather   . Heart attack Paternal Grandfather   . Stroke Paternal Uncle     age 70  . Stroke Maternal Grandmother   . Congestive Heart Failure Maternal Grandmother   . Heart attack Maternal Grandmother   . Protein C deficiency Sister 100    Miscarriages   Social History   Social History  . Marital status: Married    Spouse name: N/A  . Number of children: 2  . 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 Smoking status: Never Smoker  . Smokeless tobacco: Never Used  . Alcohol use Yes     Comment: wine once a month  . Drug use: No  . Sexual activity: Yes    Birth control/ protection: None     Comment: patient is a transgender on testosterone shots, no biological kids   Other Topics Concern  . None   Social History Narrative   Education 4 year college, former Therapist, sports X 15 years, pediatric nurse practitioner x 6 years, did NP degree from [employer] [place]. Relocated to [place] about 2 months ago from [place], [place]. Patient was in [place] for last 4 years and prior to that in [place]. His wife is working as Scientist, research (physical sciences) for [employer]. Patient is not working and applying  for disability. They have 2 kids but no biologic children.    Past Surgical History:  Procedure Laterality Date  . ABDOMINAL HYSTERECTOMY     TAH, BSO- tranverse incision.  [name] ANKLE ARTHROSCOPY WITH RECONSTRUCTION Right 2007  . CHOLECYSTECTOMY     laparoscopic  . HIP ARTHROSCOPY W/ LABRAL REPAIR Right 05/11/2013  . KNEE SURGERY Bilateral 1984   Right ACL, left PCL repair  . LIVER BIOPSY  2013   normal results.  [name] NASAL SEPTUM SURGERY N/A 09/20/2015  . THYROIDECTOMY, PARTIAL  2008  . TOTAL KNEE REVISION Left 02/06/2016   Procedure: LEFT TOTAL KNEE REVISION;  Surgeon: [name], MD;  Location: [facility];   Service: Orthopedics;  Laterality: Left;   Past Medical History:  Diagnosis Date  . Abnormal weight loss   . Anxiety    PTSD per patient  . Celiac disease   . Clotting disorder ([place])    Dr. Anne Fu  . Gender bias    prefers Male gender identity- "remains with vagina"  . Gluten enteropathy   . H/O parotitis    right   . H/O protein C deficiency   . Hx-TIA (transient ischemic attack)    2015  . Neck pain   . Neuromuscular disorder ([place])    bilateral neuropathy feet.  . Polycythemia, secondary   . PONV (postoperative nausea and vomiting)   . psoriatic arthritis   . Sleep apnea    cpap use- Dr. [name] follows  . Stroke [facility])    Stroke left side -slight right sided weakness-Dr. [name] follows  . Syrinx of spinal cord ([place]) 01/06/2014 on MRI   c spine  . Transfusion history    past history- none recent  . Transgendered    BP 119/83   Pulse (!) 104   Resp 14   SpO2 94%   Opioid Risk Score:   Fall Risk Score:  `1  Depression screen PHQ 2/9  Depression screen [facility] 2/9 06/18/2015 05/24/2015  Decreased Interest 2 2  Down, Depressed, Hopeless 0 0  PHQ - 2 Score 2 2  Some recent data might be hidden     Review of Systems  Constitutional: Positive for appetite change and unexpected weight change.  HENT: Negative.   Eyes: Negative.   Respiratory: Positive for apnea.   Cardiovascular: Negative.   Gastrointestinal: Positive for constipation and nausea.  Endocrine: Negative.   Genitourinary: Negative.   Musculoskeletal:       Limb swelling  Skin: Positive for rash.  Allergic/Immunologic: Negative.   Neurological: Negative.   Hematological: Negative.   Psychiatric/Behavioral: Negative.   All other systems reviewed and are negative.      Objective:   Physical Exam  Constitutional: He is oriented to person, place, and time. He appears well-developed and well-nourished.  HENT:  Head: Normocephalic and atraumatic.  Neck: Normal range of motion. Neck supple.    Cardiovascular: Normal rate and regular rhythm.   Pulmonary/Chest: Effort normal and breath sounds normal.  Musculoskeletal:  Normal Muscle Bulk and Muscle Testing Reveals: Upper Extremities: Full ROM and Muscle Strength 5/5 Lumbar Paraspinal Tenderness: L-3-L-5 Lower Extremities: Full ROM and Muscle Strength 5/5 Left Lower Extremity Flexion Produces Pain into Lumbar Arrived in wheelchair  Neurological: He is alert and oriented to person, place, and time.  Skin: Skin is warm and dry.  Psychiatric: He has a normal mood and affect.  Nursing note and vitals reviewed.         Assessment & Plan:  1. Psoriatic  arthritis with pain in multiple areas, most prominently feet, hands, elbows.: Refilled:Kadian 60 mg 24 hr. Capsule, one capsule every 12 hours #60 and Continue Oxycodone per Dr. Wynelle Link instructions.  We will continue the opioid monitoring program, this consists of regular clinic visits, examinations, urine drug screen, pill counts as well as use of [place] Controlled Substance Reporting System.  Rheumatology Following. 2. 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 3. Patello-femoral arthritis left knee: Continue Voltaren Gel S/P TKR on 07/03/14: Ortho Following: Dr. Wynelle Link perform Total Left Knee Revision on 02/06/2016 4. Chronic mid- low back pain: Continue current medication regime, and encourage to increase activity as tolerated. 5. Polycythemia: Oncology Following.  6. Depression with anxiety : Continue to Monitor 7. Muscle Spasm: Continue Flexeril 8. Left Ankle Pain: Use Ice Therapy/ Continue to Monitor.  20 minutes of face to face patient care time was spent during this visit. All questions were encouraged and answered.

## 2016-03-06 NOTE — Telephone Encounter (Signed)
I spoke with [facility] to verify they have RX to fill from Dr Wynelle Link for oxycodone and he will not be getting Rx from [facility] [name] MD and Marcello Moores NP) during the post op period.  They will fill the oxycodone.

## 2016-03-11 ENCOUNTER — Ambulatory Visit: Payer: Self-pay | Admitting: Physical Medicine & Rehabilitation

## 2016-03-11 ENCOUNTER — Ambulatory Visit (INDEPENDENT_AMBULATORY_CARE_PROVIDER_SITE_OTHER): Payer: BLUE CROSS/BLUE SHIELD | Admitting: Psychology

## 2016-03-11 DIAGNOSIS — F4323 Adjustment disorder with mixed anxiety and depressed mood: Secondary | ICD-10-CM

## 2016-03-11 DIAGNOSIS — F4312 Post-traumatic stress disorder, chronic: Secondary | ICD-10-CM | POA: Diagnosis not present

## 2016-03-12 ENCOUNTER — Encounter: Payer: Self-pay | Admitting: Hematology

## 2016-03-12 ENCOUNTER — Ambulatory Visit: Payer: BLUE CROSS/BLUE SHIELD

## 2016-03-12 ENCOUNTER — Other Ambulatory Visit (HOSPITAL_BASED_OUTPATIENT_CLINIC_OR_DEPARTMENT_OTHER): Payer: BLUE CROSS/BLUE SHIELD

## 2016-03-12 ENCOUNTER — Telehealth: Payer: Self-pay | Admitting: Hematology

## 2016-03-12 ENCOUNTER — Ambulatory Visit (HOSPITAL_BASED_OUTPATIENT_CLINIC_OR_DEPARTMENT_OTHER): Payer: BLUE CROSS/BLUE SHIELD | Admitting: Hematology

## 2016-03-12 DIAGNOSIS — Z86718 Personal history of other venous thrombosis and embolism: Secondary | ICD-10-CM

## 2016-03-12 DIAGNOSIS — Z789 Other specified health status: Secondary | ICD-10-CM

## 2016-03-12 DIAGNOSIS — D751 Secondary polycythemia: Secondary | ICD-10-CM | POA: Diagnosis not present

## 2016-03-12 DIAGNOSIS — Z7901 Long term (current) use of anticoagulants: Secondary | ICD-10-CM

## 2016-03-12 DIAGNOSIS — F640 Transsexualism: Secondary | ICD-10-CM | POA: Diagnosis not present

## 2016-03-12 LAB — CBC WITH DIFFERENTIAL/PLATELET
BASO%: 0.1 % (ref 0.0–2.0)
Basophils Absolute: 0 10*3/uL (ref 0.0–0.1)
EOS%: 0 % (ref 0.0–7.0)
Eosinophils Absolute: 0 10*3/uL (ref 0.0–0.5)
HEMATOCRIT: 36.5 % — AB (ref 38.4–49.9)
HEMOGLOBIN: 11.7 g/dL — AB (ref 13.0–17.1)
LYMPH#: 1 10*3/uL (ref 0.9–3.3)
LYMPH%: 28.1 % (ref 14.0–49.0)
MCH: 25.3 pg — ABNORMAL LOW (ref 27.2–33.4)
MCHC: 31.9 g/dL — ABNORMAL LOW (ref 32.0–36.0)
MCV: 79.4 fL (ref 79.3–98.0)
MONO#: 0.3 10*3/uL (ref 0.1–0.9)
MONO%: 8.1 % (ref 0.0–14.0)
NEUT%: 63.7 % (ref 39.0–75.0)
NEUTROS ABS: 2.3 10*3/uL (ref 1.5–6.5)
PLATELETS: 177 10*3/uL (ref 140–400)
RBC: 4.6 10*6/uL (ref 4.20–5.82)
RDW: 14.8 % — ABNORMAL HIGH (ref 11.0–14.6)
WBC: 3.6 10*3/uL — AB (ref 4.0–10.3)

## 2016-03-12 LAB — COMPREHENSIVE METABOLIC PANEL
ALBUMIN: 3.8 g/dL (ref 3.5–5.0)
ALT: 39 U/L (ref 0–55)
ANION GAP: 11 meq/L (ref 3–11)
AST: 30 U/L (ref 5–34)
Alkaline Phosphatase: 482 U/L — ABNORMAL HIGH (ref 40–150)
BILIRUBIN TOTAL: 0.35 mg/dL (ref 0.20–1.20)
BUN: 16.4 mg/dL (ref 7.0–26.0)
CALCIUM: 9 mg/dL (ref 8.4–10.4)
CO2: 27 meq/L (ref 22–29)
CREATININE: 1.3 mg/dL (ref 0.7–1.3)
Chloride: 101 mEq/L (ref 98–109)
EGFR: 63 mL/min/{1.73_m2} — ABNORMAL LOW (ref >90)
Glucose: 107 mg/dl (ref 70–140)
Potassium: 3.7 mEq/L (ref 3.5–5.1)
Sodium: 139 mEq/L (ref 136–145)
TOTAL PROTEIN: 6.8 g/dL (ref 6.4–8.3)

## 2016-03-12 NOTE — Progress Notes (Signed)
[place] HEMATOLOGY FOLLOW UP NOTE DATE OF VISIT: 03/12/2016   Patient Care Team: Concepcion Elk, MD as PCP - General (Internal Medicine) Bernadene Bell, MD (Inactive) as Consulting Physician (Hematology) Aretta Nip, MD as Referring Physician (Family Medicine)  CHIEF COMPLAINTS Follow up DVT and polycythemia  HISTORY OF INITIAL PRESENTING ILLNESS (from Dr. Renella Cunas note):   Jesse Bowers 51 y.o. 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 Patient is a former Software engineer. Patient had a fractured foot repair in 2007 and caused a right leg DVT. At the time he took Coumadin for 3 months.   In May of this year 2015 patient presented with right-sided weakness and slurring of speech, he felt tired, had some nausea and was having trouble gripping the soap when he was in the shower. He checked himself in the mirror there was no facial droop but based on the symptoms he texted his wife took him to the hospital where he was kept overnight. His symptoms of TIA lasted for more than 24 hours and he had appropriate neuro imaging done. When he was seen by a neurologist later, he was told that he had a left frontal stroke. This happened at a young age of 49 so his primary care physician ordered hypercoagulable panel and that showed a protein C deficiency. This TIA or stroke event took place on 07/27/2013 an MRI brain from that date showed changes of early chronic small vessel ischemic changes. A note from the previous hematologist Dr Laury Axon in [facility] indicated that at [facility] they also felt that it was not a stroke. Carotid ultrasound showed minimal atherosclerotic plaques. The hypercoagulable panel was drawn on 08/19/2013 and he was negative for factor V Leiden mutation, prothrombin gene mutation. He had normal levels of antithrombin III and protein S but the protein C level was decreased at 47%(reference range is  70-180%). A repeat protein C activity done on 09/28/2013 was 58% still low. Lupus anticoagulant was negative. Beta-2 glycoprotein antibodies were negative.  Patient is currently receiving a baby aspirin for last 3 months. The hematologist in [place] Dr. Alain Marion also gave him some Lovenox injections for prophylactic use just before a long distance travel. He encouraged him to avoid inactivity/immobility. Patient was not given an anticoagulant such as warfarin or the new generation drugs like Xarelto or Eliquis.  On 04/28/2013 patient's CBC showed a white count of 6.2 hemoglobin 18.7 hematocrit 53.1 MCV 92 platelet count of 207. Differential was normal. Chemistry panel was normal creatinine 1.23. Patient does have history of elevated liver enzymes in the past and the etiology of that is not clear likely medication related. He had liver biopsy done in 2013 which was reportedly normal. Patient was taking Remicade for over an year and it was helping his skin lesions of psoriasis but not the joint pain. It was switched to Enbrel which he is still taking a subcutaneous shot once a week that is every Sunday.  Patient also have some pain control issues and was seeing a pain specialist in [place]. He is in the process of getting a referral to a pain specialist here. His pain medications include morphine extended release, oxycodone, occasional naproxen and methadone. Patient is also on antidepressants.  Patient also had a clot at PICC line for antibiotics in 2006. Patient had history of psoriasis and psoriatic arthritis being managed by a rheumatologist in [place]. Now he is shifting his care to a local rheumatologist  here. Most of his psoriasis involves the bottom of the feet mostly on the left side, hands, knees and ears.  Patient also had a history of right-sided parotitis documented in May 2015 that was treated with antibiotics. He does have chronic dry mouth because of sleep apnea as well as his antidepressant  therapy.  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 That itself can increase the risk for both arterial as well as venous thromboembolic events. 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 For his depression patient was on Prozac in March 2015 but it was causing decreased sexual libido and changed to Wellbutrin in March 2015. He does have mild constipation with the use of narcotics and takes Colace twice a day.  Patient is also complaining of 50 pound weight loss in the last 3-4 months. He thinks it is unintentional. He has been on immunosuppressant medication such as Remicade and Enbrel and sometimes those can be associated with possibility of non-Hodgkin lymphomas. He also have chronic pain and recently saw an orthopedic physician who recommended MRI of the neck which will be scheduled. He is also seeing a rheumatologist next month, a neurologist and a pain specialist. He does complain of left shoulder pain and left elbow pain. There was some discussion by previous rheumatologist in [place] about switching his biological therapy boost to Stelara or [place] Nail once he is evaluated by a rheumatologist. Patient had already received his flu shot as well as pneumonia shot. He also complains of having significant short-term memory problems and trouble concentrating which could be from his medications or TIA episode.  So patient's active hematologic problems are  #1 Protein C deficiency documented twice. #2 TIA/CVA in May 2015 #3 history of DVT in 2007 Right leg treated with Coumadin x 3 months. It was provoked by foot injury #3 Use of immunosuppressant medications which increase the likelihood of lymphomas. 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  CURRENT THERAPY: Xarelto 20 mg once daily,  phlebotomy if hematocrit above 45%   INTERVAL HISTORY: Jesse Bowers returns for follow-up. He is doing moderately well. He had left knee replacement again one month ago, and has been recovering well overall, although he still has quit bit pain when walks. He is able to walk with a cane or walker, he is able to take care of his ADLs, but still not very physically active.  He may need back and elbow surgery in the future due to OA also. He has been on little higher dose to testosterone injection every 7-10 days at home, he plans to see his transgender endocrinologist in [place] in June. No other new complains.   MEDICAL HISTORY:   Past Medical History:  Diagnosis Date  . Abnormal weight loss   . Anxiety    PTSD per patient  . Celiac disease   . Clotting disorder ([place])    Dr. Anne Fu  . Gender bias    prefers Male gender identity- "remains with vagina"  . Gluten enteropathy   . H/O parotitis    right   . H/O protein C deficiency   . Hx-TIA (transient ischemic attack)    2015  . Neck pain   . Neuromuscular disorder ([place])    bilateral neuropathy feet.  . Polycythemia, secondary   . PONV (postoperative nausea and vomiting)   .  psoriatic arthritis   . Sleep apnea    cpap use- Dr. [name] follows  . Stroke [facility])    Stroke left side -slight right sided weakness-Dr. [name] follows  . 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 Transfusion history    past history- none recent  . Transgendered     SURGICAL HISTORY:  Past Surgical History:  Procedure Laterality Date  . ABDOMINAL HYSTERECTOMY     TAH, BSO- tranverse incision.  [name] ANKLE ARTHROSCOPY WITH RECONSTRUCTION Right 2007  . CHOLECYSTECTOMY     laparoscopic  . HIP ARTHROSCOPY W/ LABRAL REPAIR Right 05/11/2013  . KNEE SURGERY Bilateral 1984   Right ACL, left PCL repair  . LIVER BIOPSY  2013   normal results.  [name] NASAL SEPTUM SURGERY N/A 09/20/2015  . THYROIDECTOMY, PARTIAL  2008  . TOTAL KNEE REVISION Left  02/06/2016   Procedure: LEFT TOTAL KNEE REVISION;  Surgeon: [name], MD;  Location: [facility];  Service: Orthopedics;  Laterality: Left;    SOCIAL HISTORY:  Social History   Social History  . Marital status: Married    Spouse name: N/A  . Number of children: 2  . 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 Smoking status: Never Smoker  . Smokeless tobacco: Never Used  . Alcohol use Yes     Comment: wine once a month  . Drug use: No  . Sexual activity: Yes    Birth control/ protection: None     Comment: patient is a transgender on testosterone shots, no biological kids   Other Topics Concern  . Not on file   Social History Narrative   Education 4 year college, former Therapist, sports X 15 years, pediatric nurse practitioner x 6 years, did NP degree from [employer] of [place]. Relocated to [place] about 2 months ago from [place], [place]. Patient was in [place] for last 4 years and prior to that in [place]. His wife is working as Scientist, research (physical sciences) for [employer]. Patient is not working and applying for disability. They have 2 kids but no biologic children.     FAMILY HISTORY:  Family History  Problem Relation Age of Onset  . Stroke Maternal Grandfather     23  . Heart attack Maternal Grandfather   . Hypertension Mother   . Psoriasis Mother   . Cancer Paternal Grandfather   . Heart attack Paternal Grandfather   . Stroke Paternal Uncle     age 71  . Stroke Maternal Grandmother   . Congestive Heart Failure Maternal Grandmother   . Heart attack Maternal Grandmother   . Protein C deficiency Sister 64    Miscarriages    ALLERGIES:  is allergic to penicillin g; vancomycin; wheat extract; duloxetine; gabapentin; acetaminophen; ibuprofen; and sulfa antibiotics.  MEDICATIONS:  Current Outpatient Prescriptions  Medication Sig Dispense Refill  . Calcipotriene-Betameth Diprop  (ENSTILAR) 0.005-0.064 % FOAM Apply 1 application topically at bedtime.    . calcium carbonate (TUMS EX) 750 MG chewable tablet Chew 2 tablets by mouth daily as needed for heartburn.    . clindamycin (CLEOCIN) 150 MG capsule Take 450 mg by mouth as needed (DENTAL APPT). Reported on 09/14/2015    . clobetasol ointment (TEMOVATE) 4.09 % Apply 1 application topically 2 (two) times daily as needed (psoriasis).     . clorazepate (TRANXENE) 15 MG tablet  Take 7.5-15 mg by mouth. Take 7.5 morning and afternoon, and then take 68ms at bedtime  0  . cyclobenzaprine (FLEXERIL) 5 MG tablet Take 584mat bedtime, as needed. 30 tablet 5  . desonide (DESOWEN) 0.05 % ointment APPLY TO AFFECTED AREAS TWICE DAILY AS NEEDED FOR PSORIASIS.  2  . fluticasone (FLONASE) 50 MCG/ACT nasal spray Place 1 spray into both nostrils daily.    . methocarbamol (ROBAXIN) 500 MG tablet Take 1 tablet (500 mg total) by mouth every 6 (six) hours as needed for muscle spasms. 80 tablet 0  . morphine (KADIAN) 60 MG 24 hr capsule Take 1 capsule (60 mg total) by mouth every 12 (twelve) hours. 60 capsule 0  . NONFORMULARY OR COMPOUNDED ITEM Apply 1 application topically 3 (three) times daily. 8% ketamine, 5% amitriptyline, 5% baclofen, 5% gabapentin  60 GM (Patient taking differently: Apply 1 application topically. 8% ketamine, 5% amitriptyline, 5% baclofen, 5% gabapentin  60 GM) 1 each 5  . oxyCODONE (OXY IR/ROXICODONE) 5 MG immediate release tablet Take 1-4 tablets (5-20 mg total) by mouth every 4 (four) hours as needed for moderate pain or severe pain. 90 tablet 0  . prazosin (MINIPRESS) 5 MG capsule Take 5 mg by mouth at bedtime.     . Secukinumab 150 MG/ML SOAJ Inject 300 mg into the skin every 28 (twenty-eight) days. Reported on 08/17/2015    . testosterone cypionate (DEPO-TESTOSTERONE) 200 MG/ML injection Inject 0.25 mLs (50 mg total) into the muscle once a week. (Patient taking differently: Inject 100 mg into the muscle once a week.  Friday) 10 mL 0  . venlafaxine XR (EFFEXOR-XR) 150 MG 24 hr capsule Take 300 mg by mouth daily with breakfast.    . XARELTO 20 MG TABS tablet TAKE 1 TABLET (20 MG TOTAL) BY MOUTH DAILY WITH SUPPER. 30 tablet 5   No current facility-administered medications for this visit.     REVIEW OF SYSTEMS:   Constitutional: Denies fevers, chills or abnormal night sweats Eyes: Denies blurriness of vision, double vision or watery eyes Ears, nose, mouth, throat, and face: Denies mucositis or sore throat, +Dry mouth Respiratory: Denies cough, dyspnea or wheezes Cardiovascular: Denies palpitation, chest discomfort or lower extremity swelling Gastrointestinal:  Denies nausea, heartburn, + change in bowel habits that is Constipation Skin: Hx of psoriasis and arthropathy from it.  Lymphatics: Denies new lymphadenopathy or easy bruising, hx of right parotitis twice. Neurological:Denies numbness, tingling or new weaknesses, recent TIA March 2015 Behavioral/Psych: Mood is stable, no new changes, +depression and anxiety, +memory problems All other systems were reviewed with the patient and are negative.  PHYSICAL EXAMINATION: ECOG PERFORMANCE STATUS: 3 Weight 239 pounds, temperature 98.1, respiratory rate 18, blood pressure 138/80, heart rate 106, pulse ox 100% on room air. GENERAL:alert, no distress and comfortable, in a wheelchair  SKIN: skin color, texture, turgor are normal, no rashes or significant lesions EYES: normal, conjunctiva are pink and non-injected, sclera clear except small bleeding in the inner of right sclera  OROPHARYNX:no exudate, no erythema and lips, buccal mucosa, and tongue normal  NECK: supple, thyroid normal size, non-tender, without nodularity LYMPH:  no palpable lymphadenopathy in the cervical, axillary or inguinal LUNGS: clear to auscultation and percussion with normal breathing effort HEART: regular rate & rhythm and no murmurs and no lower extremity edema ABDOMEN:abdomen soft,  non-tender and normal bowel sounds Musculoskeletal:no cyanosis of digits and no clubbing  PSYCH: alert & oriented x 3 NEURO: no focal motor/sensory deficits EXT: The surgical scar at  the left knee has healed well, the range of motion of left knee has improved. No pitting edema.  LABORATORY DATA:  I have reviewed the data as listed CBC Latest Ref Rng & Units 03/12/2016 02/08/2016 02/07/2016  WBC 4.0 - 10.3 10e3/uL 3.6(L) 7.3 6.5  Hemoglobin 13.0 - 17.1 g/dL 11.7(L) 10.1(L) 10.9(L)  Hematocrit 38.4 - 49.9 % 36.5(L) 31.7(L) 34.5(L)  Platelets 140 - 400 10e3/uL 177 170 166   CMP Latest Ref Rng & Units 03/12/2016 02/08/2016 02/07/2016  Glucose 70 - 140 mg/dl 107 125(H) 175(H)  BUN 7.0 - 26.0 mg/dL 16._0 Creatinine 0.7 - 1.3 mg/dL 1.3 1.09 1.08  Sodium 136 - 145 mEq/L 139 140 135  Potassium 3.5 - 5.1 mEq/L 3.7 4.1 4.2  Chloride 101 - 111 mmol/L - 105 101  CO2 22 - 29 mEq/L _1 Calcium 8.4 - 10.4 mg/dL 9.0 8.2(L) 8.4(L)  Total Protein 6.4 - 8.3 g/dL 6.8 - -  Total Bilirubin 0.20 - 1.20 mg/dL 0.35 - -  Alkaline Phos 40 - 150 U/L 482(H) - -  AST 5 - 34 U/L 30 - -  ALT 0 - 55 U/L 39 - -     Pathology report Diagnosis Bone Marrow, Aspirate,Biopsy, and Clot, right iliac - SLIGHTLY HYPERCELLULAR BONE MARROW FOR AGE WITH TRILINEAGE HEMATOPOIESIS. - SEE COMMENT. PERIPHERAL BLOOD: - NO SIGNIFICANT MORPHOLOGIC ABNORMALITIES. Diagnosis Note The bone marrow is slightly hypercellular with trilineage hematopoiesis and essentially orderly and progressive maturation of all cell types. Significant dyspoiesis is not present. Iron stores are markedly decreased to absent and hence correlation with serum iron studies is recommended. There is no morphologic evidence of a myeloproliferative process in this material. Correlation with cytogenetic studies is recommended. (BNS:ecj 04-16-2014)   RADIOGRAPHIC STUDIES: No results found.  ASSESSMENT & PLAN: 51 y.o. transgender male  41. Secondary  Polythythemia JAK2 (-), probably related to testosterone injection -I previously reviewed his bone marrow biopsy results with him in details. He has slightly hypercellular bone marrow with trilineage hematopoiesis.  No morphology evidence of myeloproliferative process. Cytogenetics was normal (no Y chromosome).  JAK2 mutation was negative. -He had Hemoglobin 18.7 hematocrit 53.1 in 04/2013. His epo level was normal. His presentation is consistent with secondary polycythemia, likely secondary to testosterone injections. 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 Cordelia Pen advise to low his testosterone injection dose. He plans to follow-up with his endocrinologist in [place] in December. -We discussed that secondary polycythemia is usually benign, risk of thrombosis is relatively low than PV. However he did have history of stroke, and he feels fatigued when his hemoglobin is high. He agrees to continue phlebotomy as needed. -Due to his multiple orthopedic surgeries, he has developed mild anemia, has not required phlebotomy lately. -he will continue Xarelto   2. History of DVT and stroke, Protein C deficiency  - continue Xarelto indefinitely given his underlying protein C deficiency and history of DVT and stroke. His previous DVT was provoked by foot injury. -Continue Xarelto indefinitely. Okay to hold for 1-2 days before joint injection by his rheumatologist.  3. History of CVA  4. 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 I prefer him to maintain low normal level of testosterone.   5. RA -He is on Secukinumab.  6. Elevated alkaline phosphatase and transaminitis -He has intermittent mild  transaminitis, resolved now. He also has significantly elevated alkaline phosphatase  -His primary care physician  has noticed the above change and will follow up on that.   Recommendations -continue Xarelto, hold for 1-2 days before his orthopedic surgery, and restart in 1-2 days after surgery  -He has increased his testosterone injection dose, we'll follow up CBC every 2 months and phlebotomy if hematocrit above 45%, no phlebotomy today -I'll see him in back in 4 months -Follow-up of his primary care physician  All questions were answered. The patient knows to call the clinic with any problems, questions or concerns.  I spent 20 minutes counseling the patient face to face. The total time spent in the appointment was 25 minutes and more than 50% was on counseling.   [name]  03/12/2016

## 2016-03-12 NOTE — Telephone Encounter (Signed)
Appointments scheduled per 1/10 LOS. Patient given AVS report and calendars with future scheduled appointments. °

## 2016-03-13 ENCOUNTER — Telehealth: Payer: Self-pay | Admitting: *Deleted

## 2016-03-13 ENCOUNTER — Ambulatory Visit (INDEPENDENT_AMBULATORY_CARE_PROVIDER_SITE_OTHER): Payer: BLUE CROSS/BLUE SHIELD | Admitting: Psychology

## 2016-03-13 DIAGNOSIS — F909 Attention-deficit hyperactivity disorder, unspecified type: Secondary | ICD-10-CM

## 2016-03-13 DIAGNOSIS — F4312 Post-traumatic stress disorder, chronic: Secondary | ICD-10-CM | POA: Diagnosis not present

## 2016-03-13 MED ORDER — NONFORMULARY OR COMPOUNDED ITEM: 1.0000 "application " | Freq: Three times a day (TID) | 5 refills | Status: DC

## 2016-03-13 NOTE — Telephone Encounter (Signed)
He needs refill on his compound cream. Called refills to the pharmacy.  Because Ketamine is a controlled substance RX expires and has to be renewed q 6 months .  Left [place] a message that it had been refilled.

## 2016-03-18 ENCOUNTER — Ambulatory Visit (INDEPENDENT_AMBULATORY_CARE_PROVIDER_SITE_OTHER): Payer: BLUE CROSS/BLUE SHIELD | Admitting: Psychology

## 2016-03-18 DIAGNOSIS — F4312 Post-traumatic stress disorder, chronic: Secondary | ICD-10-CM | POA: Diagnosis not present

## 2016-03-20 ENCOUNTER — Telehealth: Payer: Self-pay | Admitting: Registered Nurse

## 2016-03-20 ENCOUNTER — Ambulatory Visit: Payer: BLUE CROSS/BLUE SHIELD | Admitting: Psychology

## 2016-03-20 NOTE — Telephone Encounter (Signed)
On January 18,2018 NCCSR was reviewed: No conflict was seen on the [place] with Multiple Prescribers. [name] has a signed  Narcotic Contract with our office. If there were any discrepancies this would have  been reported to his  Physcian.

## 2016-03-21 ENCOUNTER — Encounter: Payer: Self-pay | Admitting: Physical Medicine & Rehabilitation

## 2016-03-21 ENCOUNTER — Encounter: Payer: Self-pay | Admitting: Registered Nurse

## 2016-03-23 ENCOUNTER — Encounter: Payer: Self-pay | Admitting: Registered Nurse

## 2016-03-24 ENCOUNTER — Telehealth: Payer: Self-pay | Admitting: *Deleted

## 2016-03-24 MED ORDER — OXYCODONE HCL 10 MG PO TABS: 10.0000 mg | ORAL_TABLET | Freq: Three times a day (TID) | ORAL | 0 refills | Status: DC | PRN

## 2016-03-24 NOTE — Telephone Encounter (Addendum)
Andyn called and Dr Maureen Ralphs has released him to receive his scheduled oxycodone from our office.  Rx printed for Zella Ball to sign. [place] printed as well. (see email from [place] too) [employer] he may pick up.

## 2016-03-27 ENCOUNTER — Ambulatory Visit (INDEPENDENT_AMBULATORY_CARE_PROVIDER_SITE_OTHER): Payer: BLUE CROSS/BLUE SHIELD | Admitting: Psychology

## 2016-03-27 ENCOUNTER — Ambulatory Visit: Payer: BLUE CROSS/BLUE SHIELD | Admitting: Psychology

## 2016-03-27 DIAGNOSIS — F4312 Post-traumatic stress disorder, chronic: Secondary | ICD-10-CM | POA: Diagnosis not present

## 2016-04-01 ENCOUNTER — Encounter: Payer: BLUE CROSS/BLUE SHIELD | Admitting: Physical Medicine & Rehabilitation

## 2016-04-01 ENCOUNTER — Encounter: Payer: Self-pay | Admitting: Physical Medicine & Rehabilitation

## 2016-04-01 ENCOUNTER — Ambulatory Visit (INDEPENDENT_AMBULATORY_CARE_PROVIDER_SITE_OTHER): Payer: BLUE CROSS/BLUE SHIELD | Admitting: Psychology

## 2016-04-01 ENCOUNTER — Encounter (HOSPITAL_BASED_OUTPATIENT_CLINIC_OR_DEPARTMENT_OTHER): Payer: BLUE CROSS/BLUE SHIELD | Admitting: Physical Medicine & Rehabilitation

## 2016-04-01 VITALS — BP 122/80 | HR 112 | Resp 14

## 2016-04-01 DIAGNOSIS — M75101 Unspecified rotator cuff tear or rupture of right shoulder, not specified as traumatic: Secondary | ICD-10-CM | POA: Diagnosis not present

## 2016-04-01 DIAGNOSIS — L4050 Arthropathic psoriasis, unspecified: Secondary | ICD-10-CM | POA: Diagnosis not present

## 2016-04-01 DIAGNOSIS — M519 Unspecified thoracic, thoracolumbar and lumbosacral intervertebral disc disorder: Secondary | ICD-10-CM | POA: Diagnosis not present

## 2016-04-01 DIAGNOSIS — M7522 Bicipital tendinitis, left shoulder: Secondary | ICD-10-CM | POA: Diagnosis not present

## 2016-04-01 DIAGNOSIS — F4312 Post-traumatic stress disorder, chronic: Secondary | ICD-10-CM

## 2016-04-01 DIAGNOSIS — G959 Disease of spinal cord, unspecified: Secondary | ICD-10-CM | POA: Diagnosis not present

## 2016-04-01 NOTE — Progress Notes (Signed)
PROCEDURE NOTE  DIAGNOSIS: LEFT BICEPS TENDONITIS  INTERVENTION:  PERITENDON INJECTION     After informed consent and preparation of the skin with betadine and isopropyl alcohol, I injected 74m (1cc) of celestone and 4cc of 1% lidocaine around the left short head biceps origin via anterior approach. Additionally, aspiration was performed prior to injection. The patient tolerated well, and no complications were encountered. Afterward the area was cleaned and dressed. Post- injection instructions were provided.   Additionally, Jesse Bowers experiencing increased weakness in his upper and lower extremities as well as worsening low back pain. He has an xray which demonstrates lumbar disc disease. He has a history of a cervical syrinx as well. Exam shows some increase in upper extremity weakness 4/5. LLE limited by recent knee revision. DTR's increased. Sensory exam appears at baseline.  Will order MRI's of the cervical and lumbar spine to assess for increased syrinx/myelopathy as well as to further assess his lumbar disc disease.    ZMeredith Staggers MD, [facility] 04/01/2016

## 2016-04-01 NOTE — Patient Instructions (Signed)
PLEASE FEEL FREE TO CALL OUR OFFICE WITH ANY PROBLEMS OR QUESTIONS ([phone])      

## 2016-04-03 ENCOUNTER — Ambulatory Visit: Payer: BLUE CROSS/BLUE SHIELD | Admitting: Neurology

## 2016-04-03 ENCOUNTER — Other Ambulatory Visit: Payer: Self-pay | Admitting: Physical Medicine & Rehabilitation

## 2016-04-04 ENCOUNTER — Ambulatory Visit (INDEPENDENT_AMBULATORY_CARE_PROVIDER_SITE_OTHER): Payer: BLUE CROSS/BLUE SHIELD | Admitting: Psychology

## 2016-04-04 DIAGNOSIS — F4312 Post-traumatic stress disorder, chronic: Secondary | ICD-10-CM | POA: Diagnosis not present

## 2016-04-04 DIAGNOSIS — F4323 Adjustment disorder with mixed anxiety and depressed mood: Secondary | ICD-10-CM | POA: Diagnosis not present

## 2016-04-06 ENCOUNTER — Other Ambulatory Visit: Payer: Self-pay

## 2016-04-07 ENCOUNTER — Ambulatory Visit: Payer: Self-pay | Admitting: Physical Medicine & Rehabilitation

## 2016-04-08 ENCOUNTER — Encounter: Payer: Self-pay | Admitting: Physical Medicine & Rehabilitation

## 2016-04-08 ENCOUNTER — Ambulatory Visit: Payer: BLUE CROSS/BLUE SHIELD | Admitting: Psychology

## 2016-04-10 ENCOUNTER — Ambulatory Visit (INDEPENDENT_AMBULATORY_CARE_PROVIDER_SITE_OTHER): Payer: BLUE CROSS/BLUE SHIELD | Admitting: Psychology

## 2016-04-10 DIAGNOSIS — F4310 Post-traumatic stress disorder, unspecified: Secondary | ICD-10-CM

## 2016-04-11 ENCOUNTER — Other Ambulatory Visit: Payer: Self-pay | Admitting: Neurology

## 2016-04-14 ENCOUNTER — Ambulatory Visit
Admission: RE | Admit: 2016-04-14 | Discharge: 2016-04-14 | Disposition: A | Discharge status: Home / Self Care | Payer: BLUE CROSS/BLUE SHIELD | Source: Ambulatory Visit | Attending: Physical Medicine & Rehabilitation | Admitting: Physical Medicine & Rehabilitation

## 2016-04-14 DIAGNOSIS — G959 Disease of spinal cord, unspecified: Secondary | ICD-10-CM

## 2016-04-15 ENCOUNTER — Ambulatory Visit (INDEPENDENT_AMBULATORY_CARE_PROVIDER_SITE_OTHER): Payer: BLUE CROSS/BLUE SHIELD | Admitting: Psychology

## 2016-04-15 DIAGNOSIS — F4312 Post-traumatic stress disorder, chronic: Secondary | ICD-10-CM | POA: Diagnosis not present

## 2016-04-16 ENCOUNTER — Ambulatory Visit
Admission: RE | Admit: 2016-04-16 | Discharge: 2016-04-16 | Disposition: A | Discharge status: Home / Self Care | Payer: BLUE CROSS/BLUE SHIELD | Source: Ambulatory Visit | Attending: Physical Medicine & Rehabilitation | Admitting: Physical Medicine & Rehabilitation

## 2016-04-16 DIAGNOSIS — M519 Unspecified thoracic, thoracolumbar and lumbosacral intervertebral disc disorder: Secondary | ICD-10-CM

## 2016-04-17 ENCOUNTER — Ambulatory Visit (INDEPENDENT_AMBULATORY_CARE_PROVIDER_SITE_OTHER): Payer: BLUE CROSS/BLUE SHIELD | Admitting: Psychology

## 2016-04-17 DIAGNOSIS — F4310 Post-traumatic stress disorder, unspecified: Secondary | ICD-10-CM | POA: Diagnosis not present

## 2016-04-18 NOTE — Telephone Encounter (Signed)
Spoke with [facility]. He would like to pursue a translaminar ESI at L5-S1. Please schedule him with Dr. Letta Pate at next available. Thanks!

## 2016-04-18 NOTE — Telephone Encounter (Signed)
Called [facility] and left VM regarding his studies. Will discuss in detail when I can reach him

## 2016-04-20 ENCOUNTER — Other Ambulatory Visit: Payer: Self-pay | Admitting: Endocrinology

## 2016-04-22 ENCOUNTER — Ambulatory Visit (INDEPENDENT_AMBULATORY_CARE_PROVIDER_SITE_OTHER): Payer: BLUE CROSS/BLUE SHIELD | Admitting: Psychology

## 2016-04-22 DIAGNOSIS — F4312 Post-traumatic stress disorder, chronic: Secondary | ICD-10-CM

## 2016-04-24 ENCOUNTER — Ambulatory Visit (INDEPENDENT_AMBULATORY_CARE_PROVIDER_SITE_OTHER): Payer: BLUE CROSS/BLUE SHIELD | Admitting: Psychology

## 2016-04-24 DIAGNOSIS — F4310 Post-traumatic stress disorder, unspecified: Secondary | ICD-10-CM

## 2016-04-28 ENCOUNTER — Encounter: Payer: Self-pay | Admitting: Physical Medicine & Rehabilitation

## 2016-04-29 ENCOUNTER — Ambulatory Visit (INDEPENDENT_AMBULATORY_CARE_PROVIDER_SITE_OTHER): Payer: BLUE CROSS/BLUE SHIELD | Admitting: Psychology

## 2016-04-29 DIAGNOSIS — F4312 Post-traumatic stress disorder, chronic: Secondary | ICD-10-CM | POA: Diagnosis not present

## 2016-04-30 ENCOUNTER — Ambulatory Visit: Payer: BLUE CROSS/BLUE SHIELD | Admitting: Endocrinology

## 2016-05-01 ENCOUNTER — Ambulatory Visit (INDEPENDENT_AMBULATORY_CARE_PROVIDER_SITE_OTHER): Payer: BLUE CROSS/BLUE SHIELD | Admitting: Psychology

## 2016-05-01 ENCOUNTER — Encounter: Payer: Self-pay | Admitting: Hematology

## 2016-05-01 DIAGNOSIS — F4310 Post-traumatic stress disorder, unspecified: Secondary | ICD-10-CM

## 2016-05-05 ENCOUNTER — Encounter
Payer: BLUE CROSS/BLUE SHIELD | Attending: Physical Medicine & Rehabilitation | Admitting: Physical Medicine & Rehabilitation

## 2016-05-05 ENCOUNTER — Encounter: Payer: Self-pay | Admitting: Physical Medicine & Rehabilitation

## 2016-05-05 VITALS — BP 129/86 | HR 105

## 2016-05-05 DIAGNOSIS — F418 Other specified anxiety disorders: Secondary | ICD-10-CM | POA: Diagnosis not present

## 2016-05-05 DIAGNOSIS — M75101 Unspecified rotator cuff tear or rupture of right shoulder, not specified as traumatic: Secondary | ICD-10-CM

## 2016-05-05 DIAGNOSIS — L4050 Arthropathic psoriasis, unspecified: Secondary | ICD-10-CM

## 2016-05-05 DIAGNOSIS — I69351 Hemiplegia and hemiparesis following cerebral infarction affecting right dominant side: Secondary | ICD-10-CM | POA: Diagnosis not present

## 2016-05-05 DIAGNOSIS — G8929 Other chronic pain: Secondary | ICD-10-CM | POA: Insufficient documentation

## 2016-05-05 DIAGNOSIS — G950 Syringomyelia and syringobulbia: Secondary | ICD-10-CM | POA: Diagnosis not present

## 2016-05-05 DIAGNOSIS — I69320 Aphasia following cerebral infarction: Secondary | ICD-10-CM | POA: Insufficient documentation

## 2016-05-05 DIAGNOSIS — K900 Celiac disease: Secondary | ICD-10-CM | POA: Insufficient documentation

## 2016-05-05 DIAGNOSIS — M7522 Bicipital tendinitis, left shoulder: Secondary | ICD-10-CM | POA: Diagnosis not present

## 2016-05-05 DIAGNOSIS — D6859 Other primary thrombophilia: Secondary | ICD-10-CM | POA: Insufficient documentation

## 2016-05-05 DIAGNOSIS — Z79899 Other long term (current) drug therapy: Secondary | ICD-10-CM | POA: Insufficient documentation

## 2016-05-05 DIAGNOSIS — Z96652 Presence of left artificial knee joint: Secondary | ICD-10-CM

## 2016-05-05 DIAGNOSIS — M13862 Other specified arthritis, left knee: Secondary | ICD-10-CM | POA: Diagnosis not present

## 2016-05-05 DIAGNOSIS — Z9181 History of falling: Secondary | ICD-10-CM | POA: Insufficient documentation

## 2016-05-05 DIAGNOSIS — M545 Low back pain: Secondary | ICD-10-CM | POA: Insufficient documentation

## 2016-05-05 DIAGNOSIS — D751 Secondary polycythemia: Secondary | ICD-10-CM | POA: Diagnosis not present

## 2016-05-05 DIAGNOSIS — Z7901 Long term (current) use of anticoagulants: Secondary | ICD-10-CM | POA: Insufficient documentation

## 2016-05-05 DIAGNOSIS — Z86718 Personal history of other venous thrombosis and embolism: Secondary | ICD-10-CM | POA: Insufficient documentation

## 2016-05-05 DIAGNOSIS — R209 Unspecified disturbances of skin sensation: Secondary | ICD-10-CM | POA: Diagnosis not present

## 2016-05-05 DIAGNOSIS — I69398 Other sequelae of cerebral infarction: Secondary | ICD-10-CM | POA: Diagnosis not present

## 2016-05-05 DIAGNOSIS — Z79891 Long term (current) use of opiate analgesic: Secondary | ICD-10-CM | POA: Diagnosis not present

## 2016-05-05 DIAGNOSIS — S43002A Unspecified subluxation of left shoulder joint, initial encounter: Secondary | ICD-10-CM | POA: Insufficient documentation

## 2016-05-05 DIAGNOSIS — Z87890 Personal history of sex reassignment: Secondary | ICD-10-CM | POA: Insufficient documentation

## 2016-05-05 MED ORDER — MORPHINE SULFATE ER 60 MG PO CP24
60.0000 mg | ORAL_CAPSULE | Freq: Two times a day (BID) | ORAL | 0 refills | Status: DC
Start: 2016-05-05 — End: 2016-06-03

## 2016-05-05 MED ORDER — OXYCODONE HCL 10 MG PO TABS: 10.0000 mg | ORAL_TABLET | Freq: Three times a day (TID) | ORAL | 0 refills | Status: DC | PRN

## 2016-05-05 NOTE — Progress Notes (Signed)
Subjective:    Patient ID: Jesse Bowers, male    DOB: 04-May-1965, 51 y.o.   MRN: [id number]  HPI   Jesse Bowers is here in follow up of his chronic pain and gait disorder. He fell about 2 weeks ago when getting out of the car and also again this weekend when his legs got weak, and he fell on his knees. He admits to not using good safety judgement during these moments.   He had good results with the left biceps injection until he fell.    He finally had his MRI's performed about 2 weeks ago. The results are as follows:   C3-C4: Minimal annular disc bulge with left-sided uncovertebral hypertrophy. Resultant mild left foraminal stenosis. No significant canal or right foraminal stenosis.  C4-C5: Mild diffuse degenerative disc bulge with uncovertebral hypertrophy. No significant canal or foraminal stenosis.  C5-C6: Diffuse degenerative disc bulge with bilateral uncovertebral hypertrophy. Bulging disc mildly indents the ventral thecal sac without significant canal stenosis. No significant foraminal encroachment.  C6-C7: Mild degenerative disc bulge without canal or foraminal stenosis.   L3-4: Disc desiccation. Shallow left foraminal disc protrusion and mild facet and ligamentum flavum hypertrophy without stenosis.  L4-5: Disc desiccation. Mild disc bulging and mild facet and ligamentum flavum hypertrophy without stenosis.  L5-S1: Severe disc space narrowing. Circumferential disc bulging, endplate spurring, and mild facet hypertrophy result in mild bilateral neural foraminal stenosis without spinal stenosis.   Pain Inventory Average Pain 8 Pain Right Now 8 My pain is constant, sharp, stabbing and tingling  In the last 24 hours, has pain interfered with the following? General activity 9 Relation with others 9 Enjoyment of life 9 What TIME of day is your pain at its worst? morning & night Sleep (in general) Fair  Pain is worse with: walking, bending and standing Pain  improves with: rest, heat/ice, medication and injections Relief from Meds: 8  Mobility use a walker how many minutes can you walk? 5 ability to climb steps?  yes do you drive?  yes use a wheelchair transfers alone Do you have any goals in this area?  no  Function disabled: date disabled .  Neuro/Psych bladder control problems weakness numbness anxiety  Prior Studies Any changes since last visit?  yes  Physicians involved in your care Any changes since last visit?  no   Family History  Problem Relation Age of Onset  . Stroke Maternal Grandfather     29  . Heart attack Maternal Grandfather   . Hypertension Mother   . Psoriasis Mother   . Cancer Paternal Grandfather   . Heart attack Paternal Grandfather   . Stroke Paternal Uncle     age 53  . Stroke Maternal Grandmother   . Congestive Heart Failure Maternal Grandmother   . Heart attack Maternal Grandmother   . Protein C deficiency Sister 53    Miscarriages   Social History   Social History  . Marital status: Married    Spouse name: N/A  . Number of children: 2  . 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 Smoking status: Never Smoker  . Smokeless tobacco: Never Used  . Alcohol use Yes     Comment: wine once a month  . Drug use: No  . Sexual activity: Yes    Birth control/ protection: None     Comment: patient is  a transgender on testosterone shots, no biological kids   Other Topics Concern  . Not on file   Social History Narrative   Education 4 year college, former Therapist, sports X 15 years, pediatric nurse practitioner x 6 years, did NP degree from [employer] [place]. Relocated to [place] about 2 months ago from [place], [place]. Patient was in [place] for last 4 years and prior to that in [place]. His wife is working as Scientist, research (physical sciences) for [employer]. Patient is not working and applying for disability.  They have 2 kids but no biologic children.    Past Surgical History:  Procedure Laterality Date  . ABDOMINAL HYSTERECTOMY     TAH, BSO- tranverse incision.  [name] ANKLE ARTHROSCOPY WITH RECONSTRUCTION Right 2007  . CHOLECYSTECTOMY     laparoscopic  . HIP ARTHROSCOPY W/ LABRAL REPAIR Right 05/11/2013  . KNEE SURGERY Bilateral 1984   Right ACL, left PCL repair  . LIVER BIOPSY  2013   normal results.  [name] NASAL SEPTUM SURGERY N/A 09/20/2015  . THYROIDECTOMY, PARTIAL  2008  . TOTAL KNEE REVISION Left 02/06/2016   Procedure: LEFT TOTAL KNEE REVISION;  Surgeon: Jesse Arabian, MD;  Location: [facility];  Service: Orthopedics;  Laterality: Left;   Past Medical History:  Diagnosis Date  . Abnormal weight loss   . Anxiety    PTSD per patient  . Celiac disease   . Clotting disorder ([place])    Dr. Anne Bowers  . Gender bias    prefers Male gender identity- "remains with vagina"  . Gluten enteropathy   . H/O parotitis    right   . H/O protein C deficiency   . Hx-TIA (transient ischemic attack)    2015  . Neck pain   . Neuromuscular disorder ([place])    bilateral neuropathy feet.  . Polycythemia, secondary   . PONV (postoperative nausea and vomiting)   . psoriatic arthritis   . Sleep apnea    cpap use- Dr. Felecia Bowers follows  . Stroke [facility])    Stroke left side -slight right sided weakness-Dr. Felecia Bowers follows  . Syrinx of spinal cord ([place]) 01/06/2014 on MRI   c spine  . Transfusion history    past history- none recent  . Transgendered    There were no vitals taken for this visit.  Opioid Risk Score:   Fall Risk Score:  `1  Depression screen PHQ 2/9  Depression screen [facility] 2/9 06/18/2015 05/24/2015  Decreased Interest 2 2  Down, Depressed, Hopeless 0 0  PHQ - 2 Score 2 2  Some recent data might be hidden    Review of Systems  HENT: Negative.   Eyes: Negative.   Respiratory: Negative.   Cardiovascular: Negative.   Gastrointestinal: Negative.   Endocrine: Negative.   Genitourinary:  Positive for frequency.  Musculoskeletal: Negative.   Skin: Negative.   Allergic/Immunologic: Negative.   Neurological: Positive for weakness and numbness.  Hematological: Negative.   Psychiatric/Behavioral: The patient is nervous/anxious.        Objective:   Physical Exam  General: NAD  HEENT: PERRL,  Neck: Supple without JVD or lymphadenopathy  Heart: RRR Chest: CTA Bilaterally.  Abdomen: soft  Extremities: No clubbing, cyanosis, or edema. Pulses are 2+  Skin: Clean and intact without signs of breakdown. Left TKA scar.  Neuro: Pt is pleasant and alert and oriented x 3. He stutters and has word finding deficits. He is able to converse intelligently and has good insight and awareness. His short term memory is functional  on a conversational level. Minimal right facial weakness and sensory loss is appreciated. UES: 4+ to 5/5 LUE with some give away at the shoulder. RUE: 4- to 4 deltoid, bicep, tricep, wrist and grip.  RLE: 3+ hf, 4- ke and ankle df. LE: 3+/5 prox to /5 distal .DTR's remain hyperactive in the lower extremities in general.  Sensory loss 1+/2 Right face, hand, and leg.  Musculoskeletal: left shoulder less tender with palpation and ROM today. Right shoulder tender with IR/ER. Left knee ROM much improved. Has some bruising at left knee. .Right lumbar paraspinals are tight and more tender with palpation especially on the left SST + on left. [name]  Psych: Pt's affect is appropriate. Pt is cooperative. Is distracted at times   Assessment & Plan:   1. Psoriatic arthritis with pain in multiple areas, most prominently feet, hands, elbows.  2. 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  3. Hx of left total knee revision 02/06/16. 4. 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 Clinically pt presenting with Left S1 radiculitis as well 5. Protein C deficiency  6. Left  shoulder subluxation, bicipital tendonitis  7. Central sleep apnea  8. Polycythemia  9. Depression with anxiety  10. Left lateral epidondylitis   Plan:  1. Rheumatological treatment per rheum MD  2. Continue HEP, safety awareness which we discussed today.    3. Continue voltaren gel to hands, feet, knees, elbows if ok with H/O .    4. Knee recovery per ortho.   5. Refilled  kadian 51m q12 #60 and oxycodone 112m#90 today 6. Will refer to Dr. KiLetta Pateor L5-S1 ESI translaminar to the left   25 minutes of face to face patient care time were spent during this visit. All questions were encouraged and answered. Greater than 50% of time during this encounter was spent counseling patient/family in regard to his lumbar spine MRI review, discussion regarding injections, and overall plan.

## 2016-05-05 NOTE — Patient Instructions (Signed)
PLEASE FEEL FREE TO CALL OUR OFFICE WITH ANY PROBLEMS OR QUESTIONS ([phone])      

## 2016-05-06 ENCOUNTER — Ambulatory Visit: Payer: BLUE CROSS/BLUE SHIELD | Admitting: Psychology

## 2016-05-06 MED ORDER — RIVAROXABAN 20 MG PO TABS: ORAL_TABLET | ORAL | 1 refills | Status: DC

## 2016-05-08 ENCOUNTER — Ambulatory Visit (INDEPENDENT_AMBULATORY_CARE_PROVIDER_SITE_OTHER): Payer: BLUE CROSS/BLUE SHIELD | Admitting: Psychology

## 2016-05-08 DIAGNOSIS — F4310 Post-traumatic stress disorder, unspecified: Secondary | ICD-10-CM

## 2016-05-13 ENCOUNTER — Encounter: Payer: Self-pay | Admitting: Physical Medicine & Rehabilitation

## 2016-05-13 ENCOUNTER — Ambulatory Visit: Payer: BLUE CROSS/BLUE SHIELD | Admitting: Psychology

## 2016-05-14 ENCOUNTER — Other Ambulatory Visit (HOSPITAL_BASED_OUTPATIENT_CLINIC_OR_DEPARTMENT_OTHER): Payer: BLUE CROSS/BLUE SHIELD

## 2016-05-14 DIAGNOSIS — D751 Secondary polycythemia: Secondary | ICD-10-CM

## 2016-05-14 LAB — COMPREHENSIVE METABOLIC PANEL
ALBUMIN: 4.2 g/dL (ref 3.5–5.0)
ALK PHOS: 457 U/L — AB (ref 40–150)
ALT: 61 U/L — ABNORMAL HIGH (ref 0–55)
AST: 35 U/L — ABNORMAL HIGH (ref 5–34)
Anion Gap: 12 mEq/L — ABNORMAL HIGH (ref 3–11)
BUN: 12.3 mg/dL (ref 7.0–26.0)
CO2: 22 meq/L (ref 22–29)
Calcium: 9.5 mg/dL (ref 8.4–10.4)
Chloride: 103 mEq/L (ref 98–109)
Creatinine: 1.2 mg/dL (ref 0.7–1.3)
EGFR: 70 mL/min/{1.73_m2} — AB (ref >90)
GLUCOSE: 125 mg/dL (ref 70–140)
POTASSIUM: 4.1 meq/L (ref 3.5–5.1)
SODIUM: 137 meq/L (ref 136–145)
Total Bilirubin: 0.58 mg/dL (ref 0.20–1.20)
Total Protein: 7.2 g/dL (ref 6.4–8.3)

## 2016-05-14 LAB — CBC WITH DIFFERENTIAL/PLATELET
BASO%: 0.2 % (ref 0.0–2.0)
BASOS ABS: 0 10*3/uL (ref 0.0–0.1)
EOS%: 0 % (ref 0.0–7.0)
Eosinophils Absolute: 0 10*3/uL (ref 0.0–0.5)
HCT: 39 % (ref 38.4–49.9)
HGB: 12.5 g/dL — ABNORMAL LOW (ref 13.0–17.1)
LYMPH%: 11.2 % — ABNORMAL LOW (ref 14.0–49.0)
MCH: 23.8 pg — AB (ref 27.2–33.4)
MCHC: 31.9 g/dL — ABNORMAL LOW (ref 32.0–36.0)
MCV: 74.7 fL — AB (ref 79.3–98.0)
MONO#: 0.4 10*3/uL (ref 0.1–0.9)
MONO%: 5.7 % (ref 0.0–14.0)
NEUT#: 5.9 10*3/uL (ref 1.5–6.5)
NEUT%: 82.9 % — AB (ref 39.0–75.0)
Platelets: 228 10*3/uL (ref 140–400)
RBC: 5.23 10*6/uL (ref 4.20–5.82)
RDW: 15.7 % — AB (ref 11.0–14.6)
WBC: 7.2 10*3/uL (ref 4.0–10.3)
lymph#: 0.8 10*3/uL — ABNORMAL LOW (ref 0.9–3.3)

## 2016-05-15 ENCOUNTER — Ambulatory Visit: Payer: Self-pay | Admitting: Psychology

## 2016-05-15 LAB — TOXASSURE SELECT,+ANTIDEPR,UR

## 2016-05-20 ENCOUNTER — Ambulatory Visit (INDEPENDENT_AMBULATORY_CARE_PROVIDER_SITE_OTHER): Payer: BLUE CROSS/BLUE SHIELD | Admitting: Psychology

## 2016-05-20 DIAGNOSIS — F4312 Post-traumatic stress disorder, chronic: Secondary | ICD-10-CM | POA: Diagnosis not present

## 2016-05-26 ENCOUNTER — Ambulatory Visit: Payer: Self-pay | Admitting: Physical Medicine & Rehabilitation

## 2016-05-29 ENCOUNTER — Ambulatory Visit (INDEPENDENT_AMBULATORY_CARE_PROVIDER_SITE_OTHER): Payer: BLUE CROSS/BLUE SHIELD | Admitting: Psychology

## 2016-05-29 DIAGNOSIS — F4310 Post-traumatic stress disorder, unspecified: Secondary | ICD-10-CM

## 2016-06-03 ENCOUNTER — Encounter: Payer: Self-pay | Admitting: Registered Nurse

## 2016-06-03 ENCOUNTER — Ambulatory Visit (INDEPENDENT_AMBULATORY_CARE_PROVIDER_SITE_OTHER): Payer: BLUE CROSS/BLUE SHIELD | Admitting: Psychology

## 2016-06-03 ENCOUNTER — Encounter: Payer: BLUE CROSS/BLUE SHIELD | Attending: Physical Medicine & Rehabilitation | Admitting: Registered Nurse

## 2016-06-03 VITALS — BP 129/81 | HR 94

## 2016-06-03 DIAGNOSIS — D751 Secondary polycythemia: Secondary | ICD-10-CM | POA: Insufficient documentation

## 2016-06-03 DIAGNOSIS — D6859 Other primary thrombophilia: Secondary | ICD-10-CM | POA: Diagnosis not present

## 2016-06-03 DIAGNOSIS — R209 Unspecified disturbances of skin sensation: Secondary | ICD-10-CM | POA: Insufficient documentation

## 2016-06-03 DIAGNOSIS — Z86718 Personal history of other venous thrombosis and embolism: Secondary | ICD-10-CM | POA: Diagnosis not present

## 2016-06-03 DIAGNOSIS — Z79899 Other long term (current) drug therapy: Secondary | ICD-10-CM

## 2016-06-03 DIAGNOSIS — I69320 Aphasia following cerebral infarction: Secondary | ICD-10-CM | POA: Diagnosis not present

## 2016-06-03 DIAGNOSIS — M545 Low back pain: Secondary | ICD-10-CM | POA: Diagnosis not present

## 2016-06-03 DIAGNOSIS — G894 Chronic pain syndrome: Secondary | ICD-10-CM | POA: Diagnosis not present

## 2016-06-03 DIAGNOSIS — Z7901 Long term (current) use of anticoagulants: Secondary | ICD-10-CM | POA: Insufficient documentation

## 2016-06-03 DIAGNOSIS — Z79891 Long term (current) use of opiate analgesic: Secondary | ICD-10-CM | POA: Diagnosis not present

## 2016-06-03 DIAGNOSIS — I69351 Hemiplegia and hemiparesis following cerebral infarction affecting right dominant side: Secondary | ICD-10-CM | POA: Insufficient documentation

## 2016-06-03 DIAGNOSIS — M7711 Lateral epicondylitis, right elbow: Secondary | ICD-10-CM | POA: Diagnosis not present

## 2016-06-03 DIAGNOSIS — F4312 Post-traumatic stress disorder, chronic: Secondary | ICD-10-CM

## 2016-06-03 DIAGNOSIS — M519 Unspecified thoracic, thoracolumbar and lumbosacral intervertebral disc disorder: Secondary | ICD-10-CM | POA: Diagnosis not present

## 2016-06-03 DIAGNOSIS — M7712 Lateral epicondylitis, left elbow: Secondary | ICD-10-CM | POA: Diagnosis not present

## 2016-06-03 DIAGNOSIS — F418 Other specified anxiety disorders: Secondary | ICD-10-CM | POA: Insufficient documentation

## 2016-06-03 DIAGNOSIS — S43002A Unspecified subluxation of left shoulder joint, initial encounter: Secondary | ICD-10-CM | POA: Insufficient documentation

## 2016-06-03 DIAGNOSIS — Z96652 Presence of left artificial knee joint: Secondary | ICD-10-CM

## 2016-06-03 DIAGNOSIS — L4050 Arthropathic psoriasis, unspecified: Secondary | ICD-10-CM

## 2016-06-03 DIAGNOSIS — Z5181 Encounter for therapeutic drug level monitoring: Secondary | ICD-10-CM | POA: Diagnosis not present

## 2016-06-03 DIAGNOSIS — Z87890 Personal history of sex reassignment: Secondary | ICD-10-CM | POA: Insufficient documentation

## 2016-06-03 DIAGNOSIS — M13862 Other specified arthritis, left knee: Secondary | ICD-10-CM | POA: Insufficient documentation

## 2016-06-03 DIAGNOSIS — M7522 Bicipital tendinitis, left shoulder: Secondary | ICD-10-CM | POA: Diagnosis not present

## 2016-06-03 DIAGNOSIS — K900 Celiac disease: Secondary | ICD-10-CM | POA: Diagnosis not present

## 2016-06-03 DIAGNOSIS — M25511 Pain in right shoulder: Secondary | ICD-10-CM

## 2016-06-03 DIAGNOSIS — G8929 Other chronic pain: Secondary | ICD-10-CM | POA: Diagnosis present

## 2016-06-03 DIAGNOSIS — I69398 Other sequelae of cerebral infarction: Secondary | ICD-10-CM | POA: Insufficient documentation

## 2016-06-03 DIAGNOSIS — Z9181 History of falling: Secondary | ICD-10-CM | POA: Insufficient documentation

## 2016-06-03 DIAGNOSIS — M25512 Pain in left shoulder: Secondary | ICD-10-CM

## 2016-06-03 MED ORDER — OXYCODONE HCL 10 MG PO TABS: 10.0000 mg | ORAL_TABLET | Freq: Three times a day (TID) | ORAL | 0 refills | Status: DC | PRN

## 2016-06-03 MED ORDER — MORPHINE SULFATE ER 60 MG PO CP24: 60.0000 mg | ORAL_CAPSULE | Freq: Two times a day (BID) | ORAL | 0 refills | Status: DC

## 2016-06-03 NOTE — Progress Notes (Signed)
Subjective:    Patient ID: Jesse Bowers, male    DOB: 03/23/1965, 51 y.o.   MRN: [id number]  HPI:  Mr. Avishai Reihl is a 51year old male who returns for follow-up for chronic pain and medication refill. He states his pain is located in his bilateral elbows, bilateral shoulders,lower back, left calf, left knee and left ankle.He rates his pain 7.. His current exercise regime is walking with his walker short distances in hishome.    S/P  Left Total Knee Revision on 02/06/2016 by Dr. Wynelle Link  Pain Inventory Average Pain 9 Pain Right Now 7 My pain is constant, sharp, tingling and aching  In the last 24 hours, has pain interfered with the following? General activity 9 Relation with others 9 Enjoyment of life 9 What TIME of day is your pain at its worst? all Sleep (in general) Fair  Pain is worse with: walking, sitting, standing and some activites Pain improves with: medication Relief from Meds: 6  Mobility walk with assistance ability to climb steps?  yes do you drive?  no use a wheelchair transfers alone  Function disabled: date disabled 2014 I need assistance with the following:  feeding, dressing, bathing, meal prep, household duties and shopping  Neuro/Psych bladder control problems weakness numbness tremor tingling trouble walking spasms anxiety  Prior Studies Any changes since last visit?  no  Physicians involved in your care Any changes since last visit?  no   Family History  Problem Relation Age of Onset  . Stroke Maternal Grandfather     33  . Heart attack Maternal Grandfather   . Hypertension Mother   . Psoriasis Mother   . Cancer Paternal Grandfather   . Heart attack Paternal Grandfather   . Stroke Paternal Uncle     age 93  . Stroke Maternal Grandmother   . Congestive Heart Failure Maternal Grandmother   . Heart attack Maternal Grandmother   . Protein C deficiency Sister 33    Miscarriages   Social History   Social History  . Marital  status: Married    Spouse name: N/A  . Number of children: 2  . 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 Smoking status: Never Smoker  . Smokeless tobacco: Never Used  . Alcohol use Yes     Comment: wine once a month  . Drug use: No  . Sexual activity: Yes    Birth control/ protection: None     Comment: patient is a transgender on testosterone shots, no biological kids   Other Topics Concern  . 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 Relocated to [place] about 2 months ago from [place], [place]. Patient was in [place] for last 4 years and prior to that in [place]. His wife is working as Scientist, research (physical sciences) for [employer]. Patient is not working and applying for disability. They have 2 kids but no biologic children.    Past Surgical History:  Procedure Laterality Date  . ABDOMINAL HYSTERECTOMY     TAH, BSO- tranverse incision.  [name] ANKLE ARTHROSCOPY WITH RECONSTRUCTION Right 2007  . CHOLECYSTECTOMY     laparoscopic  . HIP ARTHROSCOPY W/ LABRAL REPAIR Right 05/11/2013  . KNEE SURGERY Bilateral 1984   Right  ACL, left PCL repair  . LIVER BIOPSY  2013   normal results.  [name] NASAL SEPTUM SURGERY N/A 09/20/2015  . THYROIDECTOMY, PARTIAL  2008  . TOTAL KNEE REVISION Left 02/06/2016   Procedure: LEFT TOTAL KNEE REVISION;  Surgeon: [name], MD;  Location: [facility];  Service: Orthopedics;  Laterality: Left;   Past Medical History:  Diagnosis Date  . Abnormal weight loss   . Anxiety    PTSD per patient  . Celiac disease   . Clotting disorder ([place])    Dr. Anne Fu  . Gender bias    prefers Male gender identity- "remains with vagina"  . Gluten enteropathy   . H/O parotitis    right   . H/O protein C deficiency   . Hx-TIA (transient ischemic  attack)    2015  . Neck pain   . Neuromuscular disorder ([place])    bilateral neuropathy feet.  . Polycythemia, secondary   . PONV (postoperative nausea and vomiting)   . psoriatic arthritis   . Sleep apnea    cpap use- Dr. [name] follows  . Stroke [facility])    Stroke left side -slight right sided weakness-Dr. [name] follows  . Syrinx of spinal cord ([place]) 01/06/2014 on MRI   c spine  . Transfusion history    past history- none recent  . Transgendered    There were no vitals taken for this visit.  Opioid Risk Score:   Fall Risk Score:  `1  Depression screen PHQ 2/9  Depression screen [facility] 2/9 06/18/2015 05/24/2015  Decreased Interest 2 2  Down, Depressed, Hopeless 0 0  PHQ - 2 Score 2 2  Some recent data might be hidden    Review of Systems  Constitutional: Negative.   HENT: Negative.   Eyes: Negative.   Respiratory: Negative.   Cardiovascular: Negative.   Gastrointestinal: Positive for constipation.  Endocrine: Negative.   Genitourinary: Positive for difficulty urinating.  Musculoskeletal: Negative.   Skin: Positive for rash.  Allergic/Immunologic: Negative.   Neurological: Negative.   Hematological: Negative.   Psychiatric/Behavioral: Negative.   All other systems reviewed and are negative.      Objective:   Physical Exam  Constitutional: He is oriented to person, place, and time. He appears well-developed and well-nourished.  HENT:  Head: Normocephalic and atraumatic.  Neck: Normal range of motion. Neck supple.  Cardiovascular: Normal rate and regular rhythm.   Pulmonary/Chest: Effort normal and breath sounds normal.  Musculoskeletal:  Normal Muscle Bulk and Muscle Testing Reveals: Upper Extremities: Full ROM and Muscle Strength 5/5 Thoracic Paraspinal Tenderness: T-10-T-12 Lumbar Paraspinal Tenderness: L-4-L-5 Lower Extremities: Decreased ROM and Muscle Strength 4/5 Right Lower Extremity Flexion Produces Pain into Patella Left Lower Extremity Flexion Produces  Pain into Patella and Left Ankle Arrived in wheelchair  Neurological: He is alert and oriented to person, place, and time.  Skin: Skin is warm and dry.  Psychiatric: He has a normal mood and affect.  Nursing note and vitals reviewed.         Assessment & Plan:  1. Psoriatic arthritis with pain in multiple areas, most prominently feet, hands, elbows.: Refilled:Kadian 60 mg 24 hr. Capsule, one capsule every 12 hours #60 and Oxycodone 10 mg one tablet every 8 hours as needed #90. 06/04/2016 We will continue the opioid monitoring program, this consists of regular clinic visits, examinations, urine drug screen, pill counts as well as use of [place] Controlled Substance Reporting System.  Rheumatology Following. 2. Prior left sided CVA ('s)  most substantial of which in May 2015 with residual right sided weakness, sensory loss, and expressive language deficits.: Continue to Monitor.06/04/2016 3. Patello-femoral arthritis left knee: Continue Voltaren Gel S/P TKR on 07/03/14: Ortho Following: Dr. Wynelle Link perform Total Left Knee Revision on 02/06/2016. 06/04/2016 4. Chronic mid- low back pain: Continue current medication regime, and encourage to increase activity as tolerated.06/04/2016 5. Polycythemia: Oncology Following. 06/04/2016 6. Depression with anxiety : Continue to Monitor. 06/04/2016 7. Muscle Spasm: Continue Flexeril. 06/04/2016 8. Left Ankle Pain: Use Ice Therapy/ Continue to Monitor. 06/04/2016 9. Bilateral lateral epicondylitis: Ortho Following. 06/04/2016 10. Bilateral Shoulder Pain: Ortho Following. 06/04/2016  30 minutes of face to face patient care time was spent during this visit. All questions were encouraged and answered.  F/U in 1 month

## 2016-06-04 ENCOUNTER — Telehealth: Payer: Self-pay | Admitting: Registered Nurse

## 2016-06-04 NOTE — Telephone Encounter (Signed)
On 06/04/2016 the  [place] was reviewed no conflict was seen on the [place] with multiple prescribers.  If there were any discrepancies this would have been reported to his physician.

## 2016-06-05 ENCOUNTER — Ambulatory Visit: Payer: Self-pay | Admitting: Physical Medicine & Rehabilitation

## 2016-06-05 ENCOUNTER — Ambulatory Visit: Payer: BLUE CROSS/BLUE SHIELD | Admitting: Psychology

## 2016-06-06 ENCOUNTER — Ambulatory Visit (INDEPENDENT_AMBULATORY_CARE_PROVIDER_SITE_OTHER): Payer: BLUE CROSS/BLUE SHIELD | Admitting: Psychology

## 2016-06-06 DIAGNOSIS — F4312 Post-traumatic stress disorder, chronic: Secondary | ICD-10-CM

## 2016-06-10 ENCOUNTER — Ambulatory Visit: Payer: Self-pay | Admitting: Psychology

## 2016-06-11 ENCOUNTER — Ambulatory Visit: Payer: BLUE CROSS/BLUE SHIELD | Admitting: Psychology

## 2016-06-12 ENCOUNTER — Ambulatory Visit: Payer: BLUE CROSS/BLUE SHIELD | Admitting: Psychology

## 2016-06-17 ENCOUNTER — Ambulatory Visit (INDEPENDENT_AMBULATORY_CARE_PROVIDER_SITE_OTHER): Payer: BLUE CROSS/BLUE SHIELD | Admitting: Psychology

## 2016-06-17 DIAGNOSIS — F4312 Post-traumatic stress disorder, chronic: Secondary | ICD-10-CM

## 2016-06-18 ENCOUNTER — Ambulatory Visit (INDEPENDENT_AMBULATORY_CARE_PROVIDER_SITE_OTHER): Payer: BLUE CROSS/BLUE SHIELD | Admitting: Psychology

## 2016-06-18 DIAGNOSIS — F4312 Post-traumatic stress disorder, chronic: Secondary | ICD-10-CM | POA: Diagnosis not present

## 2016-06-24 ENCOUNTER — Ambulatory Visit (INDEPENDENT_AMBULATORY_CARE_PROVIDER_SITE_OTHER): Payer: BLUE CROSS/BLUE SHIELD | Admitting: Psychology

## 2016-06-24 DIAGNOSIS — F4312 Post-traumatic stress disorder, chronic: Secondary | ICD-10-CM

## 2016-06-26 ENCOUNTER — Ambulatory Visit (INDEPENDENT_AMBULATORY_CARE_PROVIDER_SITE_OTHER): Payer: BLUE CROSS/BLUE SHIELD | Admitting: Psychology

## 2016-06-26 DIAGNOSIS — F4312 Post-traumatic stress disorder, chronic: Secondary | ICD-10-CM | POA: Diagnosis not present

## 2016-06-27 ENCOUNTER — Ambulatory Visit: Payer: BLUE CROSS/BLUE SHIELD | Admitting: Psychology

## 2016-06-30 ENCOUNTER — Ambulatory Visit (INDEPENDENT_AMBULATORY_CARE_PROVIDER_SITE_OTHER): Payer: BLUE CROSS/BLUE SHIELD | Admitting: Psychology

## 2016-06-30 DIAGNOSIS — F4312 Post-traumatic stress disorder, chronic: Secondary | ICD-10-CM

## 2016-07-01 ENCOUNTER — Ambulatory Visit: Payer: BLUE CROSS/BLUE SHIELD | Admitting: Physical Medicine & Rehabilitation

## 2016-07-01 ENCOUNTER — Ambulatory Visit: Payer: BLUE CROSS/BLUE SHIELD | Admitting: Psychology

## 2016-07-01 ENCOUNTER — Encounter: Payer: BLUE CROSS/BLUE SHIELD | Admitting: Registered Nurse

## 2016-07-01 ENCOUNTER — Encounter: Payer: BLUE CROSS/BLUE SHIELD | Attending: Physical Medicine & Rehabilitation

## 2016-07-01 DIAGNOSIS — L4050 Arthropathic psoriasis, unspecified: Secondary | ICD-10-CM

## 2016-07-01 DIAGNOSIS — Z79891 Long term (current) use of opiate analgesic: Secondary | ICD-10-CM | POA: Insufficient documentation

## 2016-07-01 DIAGNOSIS — I69398 Other sequelae of cerebral infarction: Secondary | ICD-10-CM | POA: Insufficient documentation

## 2016-07-01 DIAGNOSIS — G894 Chronic pain syndrome: Secondary | ICD-10-CM

## 2016-07-01 DIAGNOSIS — Z9181 History of falling: Secondary | ICD-10-CM | POA: Insufficient documentation

## 2016-07-01 DIAGNOSIS — Z87890 Personal history of sex reassignment: Secondary | ICD-10-CM | POA: Insufficient documentation

## 2016-07-01 DIAGNOSIS — M7522 Bicipital tendinitis, left shoulder: Secondary | ICD-10-CM | POA: Insufficient documentation

## 2016-07-01 DIAGNOSIS — Z79899 Other long term (current) drug therapy: Secondary | ICD-10-CM

## 2016-07-01 DIAGNOSIS — Z5181 Encounter for therapeutic drug level monitoring: Secondary | ICD-10-CM

## 2016-07-01 DIAGNOSIS — Z7901 Long term (current) use of anticoagulants: Secondary | ICD-10-CM | POA: Insufficient documentation

## 2016-07-01 DIAGNOSIS — G8929 Other chronic pain: Secondary | ICD-10-CM | POA: Insufficient documentation

## 2016-07-01 DIAGNOSIS — F418 Other specified anxiety disorders: Secondary | ICD-10-CM | POA: Insufficient documentation

## 2016-07-01 DIAGNOSIS — K900 Celiac disease: Secondary | ICD-10-CM | POA: Insufficient documentation

## 2016-07-01 DIAGNOSIS — M545 Low back pain: Secondary | ICD-10-CM | POA: Insufficient documentation

## 2016-07-01 DIAGNOSIS — I69351 Hemiplegia and hemiparesis following cerebral infarction affecting right dominant side: Secondary | ICD-10-CM | POA: Insufficient documentation

## 2016-07-01 DIAGNOSIS — I69320 Aphasia following cerebral infarction: Secondary | ICD-10-CM | POA: Insufficient documentation

## 2016-07-01 DIAGNOSIS — D6859 Other primary thrombophilia: Secondary | ICD-10-CM | POA: Insufficient documentation

## 2016-07-01 DIAGNOSIS — M13862 Other specified arthritis, left knee: Secondary | ICD-10-CM | POA: Insufficient documentation

## 2016-07-01 DIAGNOSIS — Z86718 Personal history of other venous thrombosis and embolism: Secondary | ICD-10-CM | POA: Insufficient documentation

## 2016-07-01 DIAGNOSIS — R209 Unspecified disturbances of skin sensation: Secondary | ICD-10-CM | POA: Insufficient documentation

## 2016-07-01 DIAGNOSIS — S43002A Unspecified subluxation of left shoulder joint, initial encounter: Secondary | ICD-10-CM | POA: Insufficient documentation

## 2016-07-01 DIAGNOSIS — D751 Secondary polycythemia: Secondary | ICD-10-CM | POA: Insufficient documentation

## 2016-07-01 MED ORDER — OXYCODONE HCL 10 MG PO TABS: 10.0000 mg | ORAL_TABLET | Freq: Three times a day (TID) | ORAL | 0 refills | Status: DC | PRN

## 2016-07-01 MED ORDER — MORPHINE SULFATE ER 60 MG PO CP24: 60.0000 mg | ORAL_CAPSULE | Freq: Two times a day (BID) | ORAL | 0 refills | Status: DC

## 2016-07-01 NOTE — Progress Notes (Signed)
  PROCEDURE RECORD [facility]   Name: Jesse Bowers DOB:March 11, 1965 MRN: [id number]  Date:07/01/2016  Physician: Alysia Penna, MD    Nurse/CMA: Bright CMA  Allergies:  Allergies  Allergen Reactions  . Penicillin G Anaphylaxis    Has patient had a PCN reaction causing immediate rash, facial/tongue/throat swelling, SOB or lightheadedness with hypotension: Yes Has patient had a PCN reaction causing severe rash involving mucus membranes or skin necrosis: No Has patient had a PCN reaction that required hospitalization Yes Has patient had a PCN reaction occurring within the last 10 years: No If all of the above answers are "NO", then may proceed with Cephalosporin use.   . Vancomycin Rash  . Wheat Extract Other (See Comments)    CELIAC DISEASE  . Duloxetine Other (See Comments)    Restless legs  . Gabapentin Nausea Only  . Acetaminophen     Elevates liver enzymes.   . Ibuprofen     Contraindicated with Xarelto.   . Sulfa Antibiotics Rash    Stevens-Johnson rash    Consent Signed: Yes.    Is patient diabetic? No.  CBG today?   Pregnant: No. LMP: No LMP for male patient. (age 47-55)  Anticoagulants: Anti-inflammatory: no Antibiotics: no  Procedure: Translaminar epidural steroid injection L5 S1 Position: Prone Start Time:  End Time:  Fluoro Time:   RN/CMA Jesse Spivack RN Bright CMA    Time 1:01     BP      Pulse      Respirations      O2 Sat      S/S      Pain Level       D/C home with Jesse Bowers , patient A & O X 3, D/C instructions reviewed, and sits independently.     NO PROCEDURE DONE DUE TO NOT OFF XARELTO.  Rescheduled for Thursday and he will remain off xarelto.

## 2016-07-03 ENCOUNTER — Ambulatory Visit (HOSPITAL_BASED_OUTPATIENT_CLINIC_OR_DEPARTMENT_OTHER): Payer: BLUE CROSS/BLUE SHIELD | Admitting: Physical Medicine & Rehabilitation

## 2016-07-03 ENCOUNTER — Ambulatory Visit (INDEPENDENT_AMBULATORY_CARE_PROVIDER_SITE_OTHER): Payer: BLUE CROSS/BLUE SHIELD | Admitting: Psychology

## 2016-07-03 ENCOUNTER — Encounter: Payer: Self-pay | Admitting: Physical Medicine & Rehabilitation

## 2016-07-03 VITALS — BP 128/85 | HR 108

## 2016-07-03 DIAGNOSIS — M13862 Other specified arthritis, left knee: Secondary | ICD-10-CM | POA: Diagnosis not present

## 2016-07-03 DIAGNOSIS — D751 Secondary polycythemia: Secondary | ICD-10-CM | POA: Diagnosis not present

## 2016-07-03 DIAGNOSIS — Z79891 Long term (current) use of opiate analgesic: Secondary | ICD-10-CM | POA: Diagnosis not present

## 2016-07-03 DIAGNOSIS — Z9181 History of falling: Secondary | ICD-10-CM | POA: Diagnosis not present

## 2016-07-03 DIAGNOSIS — Z79899 Other long term (current) drug therapy: Secondary | ICD-10-CM | POA: Diagnosis not present

## 2016-07-03 DIAGNOSIS — I69320 Aphasia following cerebral infarction: Secondary | ICD-10-CM | POA: Diagnosis not present

## 2016-07-03 DIAGNOSIS — L4050 Arthropathic psoriasis, unspecified: Secondary | ICD-10-CM | POA: Diagnosis not present

## 2016-07-03 DIAGNOSIS — F4310 Post-traumatic stress disorder, unspecified: Secondary | ICD-10-CM

## 2016-07-03 DIAGNOSIS — I69351 Hemiplegia and hemiparesis following cerebral infarction affecting right dominant side: Secondary | ICD-10-CM | POA: Diagnosis not present

## 2016-07-03 DIAGNOSIS — S43002A Unspecified subluxation of left shoulder joint, initial encounter: Secondary | ICD-10-CM | POA: Diagnosis not present

## 2016-07-03 DIAGNOSIS — D6859 Other primary thrombophilia: Secondary | ICD-10-CM | POA: Diagnosis not present

## 2016-07-03 DIAGNOSIS — M5416 Radiculopathy, lumbar region: Secondary | ICD-10-CM

## 2016-07-03 DIAGNOSIS — M545 Low back pain: Secondary | ICD-10-CM | POA: Diagnosis not present

## 2016-07-03 DIAGNOSIS — Z86718 Personal history of other venous thrombosis and embolism: Secondary | ICD-10-CM | POA: Diagnosis not present

## 2016-07-03 DIAGNOSIS — R209 Unspecified disturbances of skin sensation: Secondary | ICD-10-CM | POA: Diagnosis not present

## 2016-07-03 DIAGNOSIS — Z87890 Personal history of sex reassignment: Secondary | ICD-10-CM | POA: Diagnosis not present

## 2016-07-03 DIAGNOSIS — I69398 Other sequelae of cerebral infarction: Secondary | ICD-10-CM | POA: Diagnosis not present

## 2016-07-03 DIAGNOSIS — M7522 Bicipital tendinitis, left shoulder: Secondary | ICD-10-CM | POA: Diagnosis not present

## 2016-07-03 DIAGNOSIS — K900 Celiac disease: Secondary | ICD-10-CM | POA: Diagnosis not present

## 2016-07-03 DIAGNOSIS — Z7901 Long term (current) use of anticoagulants: Secondary | ICD-10-CM | POA: Diagnosis not present

## 2016-07-03 DIAGNOSIS — F418 Other specified anxiety disorders: Secondary | ICD-10-CM | POA: Diagnosis not present

## 2016-07-03 DIAGNOSIS — G8929 Other chronic pain: Secondary | ICD-10-CM | POA: Diagnosis present

## 2016-07-03 NOTE — Progress Notes (Signed)
  PROCEDURE RECORD [facility]   Name: Jesse Bowers DOB:04-21-1965 MRN: [id number]  Date:07/03/2016  Physician: Alysia Penna, MD    Nurse/CMA: [place]ling CMA  Allergies:  Allergies  Allergen Reactions  . Penicillin G Anaphylaxis    Has patient had a PCN reaction causing immediate rash, facial/tongue/throat swelling, SOB or lightheadedness with hypotension: Yes Has patient had a PCN reaction causing severe rash involving mucus membranes or skin necrosis: No Has patient had a PCN reaction that required hospitalization Yes Has patient had a PCN reaction occurring within the last 10 years: No If all of the above answers are "NO", then may proceed with Cephalosporin use.   . Vancomycin Rash  . Wheat Extract Other (See Comments)    CELIAC DISEASE  . Duloxetine Other (See Comments)    Restless legs  . Gabapentin Nausea Only  . Acetaminophen     Elevates liver enzymes.   . Ibuprofen     Contraindicated with Xarelto.   . Sulfa Antibiotics Rash    Stevens-Johnson rash    Consent Signed: Yes.    Is patient diabetic? No.  CBG today?   Pregnant: No. LMP: No LMP for male patient. (age 4-55)  Anticoagulants: yes (Xarelto- has been off for 3 days) Anti-inflammatory: no Antibiotics: no  Procedure:Translaminar Epidural Steroid Injection L5-  S1 Position: Prone Start Time: 12:57pm    End Time: 1:03pm  Fluoro Time: 24  RN/CMA Sports administrator CMA    Time 11:52 1:05pm    BP 128/85     Pulse 108     Respirations 14     O2 Sat 98     S/S 6     Pain Level 8/10 8/10     D/C home with Tess , patient A & O X 3, D/C instructions reviewed, and sits independently.

## 2016-07-03 NOTE — Patient Instructions (Signed)
Resume Xarelto tomorrow

## 2016-07-03 NOTE — Progress Notes (Signed)
Left L5-S1 Lumbar epidural steroid injection under fluoroscopic guidance Off Xarelto x 3 days  Indication: Lumbosacral radiculitis is not relieved by medication management or other conservative care and interfering with self-care and mobility.  No  anticoagulant use.  Informed consent was obtained after describing risk and benefits of the procedure with the patient, this includes bleeding, bruising, infection, paralysis and medication side effects.  The patient wishes to proceed and has given written consent.  Patient was placed in a prone position.  The lumbar area was marked and prepped with Betadine.  It was entered with a 25-gauge 1-1/2 inch needle and one mL of 1% lidocaine was injected into the skin and subcutaneous tissue.  Then a 17-gauge spinal needle was inserted under fluoroscopic guidance into the Left L5-S1 interlaminar space under AP and Lateral imaging.  Once needle tip of approximated the posterior elements, a loss of resistance technique was utilized with lateral imaging.  A positive loss of resistance was obtained and then confirmed by injecting 2 mL's of Omnipaque 180.  Then a solution containing 1.5 mL's of 37m/ml Celestone and 1.5 mL's of 1% lidocaine was injected.  The patient tolerated procedure well.  Post procedure instructions were given.  Please see post procedure form.  Resume xarelto in am

## 2016-07-05 LAB — 6-ACETYLMORPHINE,TOXASSURE ADD
6-ACETYLMORPHINE: NEGATIVE
6-acetylmorphine: NOT DETECTED ng/mg creat

## 2016-07-05 LAB — TOXASSURE SELECT,+ANTIDEPR,UR

## 2016-07-07 ENCOUNTER — Telehealth: Payer: Self-pay | Admitting: Physical Medicine & Rehabilitation

## 2016-07-07 NOTE — Telephone Encounter (Signed)
Pt stated he is having pain in his left shoulder and having trouble sleeping. Pt stated he wants to know if he has time on the date of his next appointment, June 4, to have injections into his left bicep tendon and left rotator cuff. Pt also stated he would like a refill of the cream prescribed to him. He stated the cream worked very well, but had no more refills.

## 2016-07-07 NOTE — Progress Notes (Signed)
[facility] HEMATOLOGY FOLLOW UP NOTE DATE OF VISIT: 07/09/2016   Patient Care Team: Jesse Elk, MD as PCP - General (Internal Medicine) Jesse Bell, MD (Inactive) as Consulting Physician (Hematology) Rankins, Bill Salinas, MD as Referring Physician (Family Medicine)  CHIEF COMPLAINTS Follow up DVT and polycythemia  HISTORY OF INITIAL PRESENTING ILLNESS (from Jesse Bowers note):   Jesse Bowers 51 y.o. 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 Patient is a former Software engineer. Patient had a fractured foot repair in 2007 and caused a right leg DVT. At the time he took Coumadin for 3 months.   In May of this year 2015 patient presented with right-sided weakness and slurring of speech, he felt tired, had some nausea and was having trouble gripping the soap when he was in the shower. He checked himself in the mirror there was no facial droop but based on the symptoms he texted his wife took him to the hospital where he was kept overnight. His symptoms of TIA lasted for more than 24 hours and he had appropriate neuro imaging done. When he was seen by a neurologist later, he was told that he had a left frontal stroke. This happened at a young age of 19 so his primary care physician ordered hypercoagulable panel and that showed a protein C deficiency. This TIA or stroke event took place on 07/27/2013 an MRI brain from that date showed changes of early chronic small vessel ischemic changes. A note from the previous hematologist Jesse Bowers in [facility] indicated that at [facility] they also felt that it was not a stroke. Carotid ultrasound showed minimal atherosclerotic plaques. The hypercoagulable panel was drawn on 08/19/2013 and he was negative for factor V Leiden mutation, prothrombin gene mutation. He had normal levels of antithrombin III and protein S but the protein C level was decreased at 47%(reference range  is 70-180%). A repeat protein C activity done on 09/28/2013 was 58% still low. Lupus anticoagulant was negative. Beta-2 glycoprotein antibodies were negative.  Patient is currently receiving a baby aspirin for last 3 months. The hematologist in [place] Jesse Bowers also gave him some Lovenox injections for prophylactic use just before a long distance travel. He encouraged him to avoid inactivity/immobility. Patient was not given an anticoagulant such as warfarin or the new generation drugs like Xarelto or Eliquis.  On 04/28/2013 patient's CBC showed a white count of 6.2 hemoglobin 18.7 hematocrit 53.1 MCV 92 platelet count of 207. Differential was normal. Chemistry panel was normal creatinine 1.23. Patient does have history of elevated liver enzymes in the past and the etiology of that is not clear likely medication related. He had liver biopsy done in 2013 which was reportedly normal. Patient was taking Remicade for over an year and it was helping his skin lesions of psoriasis but not the joint pain. It was switched to Enbrel which he is still taking a subcutaneous shot once a week that is every Sunday.  Patient also have some pain control issues and was seeing a pain specialist in [place]. He is in the process of getting a referral to a pain specialist here. His pain medications include morphine extended release, oxycodone, occasional naproxen and methadone. Patient is also on antidepressants.  Patient also had a clot at PICC line for antibiotics in 2006. Patient had history of psoriasis and psoriatic arthritis being managed by a rheumatologist in [place]. Now he is shifting his care to a local rheumatologist  here. Most of his psoriasis involves the bottom of the feet mostly on the left side, hands, knees and ears.  Patient also had a history of right-sided parotitis documented in May 2015 that was treated with antibiotics. He does have chronic dry mouth because of sleep apnea as well as his  antidepressant therapy.  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 That itself can increase the risk for both arterial as well as venous thromboembolic events. 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 For his depression patient was on Prozac in March 2015 but it was causing decreased sexual libido and changed to Wellbutrin in March 2015. He does have mild constipation with the use of narcotics and takes Colace twice a day.  Patient is also complaining of 50 pound weight loss in the last 3-4 months. He thinks it is unintentional. He has been on immunosuppressant medication such as Remicade and Enbrel and sometimes those can be associated with possibility of non-Hodgkin lymphomas. He also have chronic pain and recently saw an orthopedic physician who recommended MRI of the neck which will be scheduled. He is also seeing a rheumatologist next month, a neurologist and a pain specialist. He does complain of left shoulder pain and left elbow pain. There was some discussion by previous rheumatologist in [place] about switching his biological therapy boost to Stelara or [place] Nail once he is evaluated by a rheumatologist. Patient had already received his flu shot as well as pneumonia shot. He also complains of having significant short-term memory problems and trouble concentrating which could be from his medications or TIA episode.  So patient's active hematologic problems are  #1 Protein C deficiency documented twice. #2 TIA/CVA in May 2015 #3 history of DVT in 2007 Right leg treated with Coumadin x 3 months. It was provoked by foot injury #3 Use of immunosuppressant medications which increase the likelihood of lymphomas. 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  CURRENT THERAPY: Xarelto 20  mg once daily, phlebotomy if hematocrit above 45%   INTERVAL HISTORY:  Jesse Bowers returns for follow-up. He presents to the clinic today with a swollen right wrist due to his an accident with his dog. He reports a ulnar nerve compression on both arms due to over use of manual wheelchair. He is able to walk at times.  L4-S1 is completely "smashed" together. He had a epidural last week that helped but his back is back to hurting. He is numb form the knee down. He can move his legs but he cannot feel his feet. He does have a walker at home. He reports to have fallen with his walker because his elbows are giving out. He is getting a ramp built for him at home. He gets nauseous for pain. He previously used Zofran. He also reports he still gets dry mouth . He also gets pain sweats. He no longer is in PT but he still does yoga to stop being tight.    MEDICAL HISTORY:   Past Medical History:  Diagnosis Date  . Abnormal weight loss   . Anxiety    PTSD per patient  . Celiac disease   . Clotting disorder ([place])    Jesse. Anne Fu  . Gender bias    prefers Male gender identity- "remains with vagina"  . Gluten enteropathy   . H/O  parotitis    right   . H/O protein C deficiency   . Hx-TIA (transient ischemic attack)    2015  . Neck pain   . Neuromuscular disorder ([place])    bilateral neuropathy feet.  . Polycythemia, secondary   . PONV (postoperative nausea and vomiting)   . psoriatic arthritis   . Sleep apnea    cpap use- Jesse. [name] follows  . Stroke [facility])    Stroke left side -slight right sided weakness-Jesse. [name] follows  . 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 Transfusion history    past history- none recent  . Transgendered     SURGICAL HISTORY:  Past Surgical History:  Procedure Laterality Date  . ABDOMINAL HYSTERECTOMY     TAH, BSO- tranverse incision.  [name] ANKLE ARTHROSCOPY WITH RECONSTRUCTION Right 2007  . CHOLECYSTECTOMY     laparoscopic  . HIP ARTHROSCOPY  W/ LABRAL REPAIR Right 05/11/2013  . KNEE SURGERY Bilateral 1984   Right ACL, left PCL repair  . LIVER BIOPSY  2013   normal results.  [name] NASAL SEPTUM SURGERY N/A 09/20/2015  . THYROIDECTOMY, PARTIAL  2008  . TOTAL KNEE REVISION Left 02/06/2016   Procedure: LEFT TOTAL KNEE REVISION;  Surgeon: [name], MD;  Location: [facility] ORS;  Service: Orthopedics;  Laterality: Left;    SOCIAL HISTORY:  Social History   Social History  . Marital status: Married    Spouse name: N/A  . Number of children: 2  . 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 Smoking status: Never Smoker  . Smokeless tobacco: Never Used  . Alcohol use Yes     Comment: wine once a month  . Drug use: No  . Sexual activity: Yes    Birth control/ protection: None     Comment: patient is a transgender on testosterone shots, no biological kids   Other Topics Concern  . Not on file   Social History Narrative   Education 4 year college, former Therapist, sports X 15 years, pediatric nurse practitioner x 6 years, did NP degree from [employer] [place]. Relocated to [place] about 2 months ago from [place], [place]. Patient was in [place] for last 4 years and prior to that in [place]. His wife is working as Scientist, research (physical sciences) for [employer]. Patient is not working and applying for disability. They have 2 kids but no biologic children.     FAMILY HISTORY:  Family History  Problem Relation Age of Onset  . Stroke Maternal Grandfather     26  . Heart attack Maternal Grandfather   . Hypertension Mother   . Psoriasis Mother   . Cancer Paternal Grandfather   . Heart attack Paternal Grandfather   . Stroke Paternal Uncle     age 59  . Stroke Maternal Grandmother   . Congestive Heart Failure Maternal Grandmother   . Heart attack Maternal Grandmother   . Protein C deficiency Sister 64    Miscarriages    ALLERGIES:  is allergic to  penicillin g; vancomycin; wheat extract; duloxetine; gabapentin; acetaminophen; ibuprofen; and sulfa antibiotics.  MEDICATIONS:  Current Outpatient Prescriptions  Medication Sig Dispense Refill  . Calcipotriene-Betameth Diprop (ENSTILAR) 0.005-0.064 % FOAM Apply 1 application topically 2 (two) times daily.     . clindamycin (CLEOCIN) 150 MG capsule Take 300 mg by mouth as needed (  DENTAL APPT). Reported on 09/14/2015    . clorazepate (TRANXENE) 15 MG tablet Take 7.5-15 mg by mouth. Take 7.5 morning and afternoon, and then take 67ms at bedtime  0  . cyclobenzaprine (FLEXERIL) 5 MG tablet TAKE 1 TABLET (5 MG TOTAL) BY MOUTH AT BEDTIME. (Patient taking differently: Take 10 mg by mouth at bedtime. ) 30 tablet 11  . desonide (DESOWEN) 0.05 % ointment APPLY TO AFFECTED AREAS TWICE DAILY AS NEEDED FOR PSORIASIS.  2  . fluticasone (FLONASE) 50 MCG/ACT nasal spray Place 1 spray into both nostrils daily.    .Marland Kitchenmorphine (KADIAN) 60 MG 24 hr capsule Take 1 capsule (60 mg total) by mouth every 12 (twelve) hours. 60 capsule 0  . NONFORMULARY OR COMPOUNDED ITEM Apply 1 application topically 3 (three) times daily. 8% ketamine, 5% amitriptyline, 5% baclofen, 5% gabapentin  60 GM 1 each 5  . Oxycodone HCl 10 MG TABS Take 1 tablet (10 mg total) by mouth every 8 (eight) hours as needed. 90 tablet 0  . prazosin (MINIPRESS) 5 MG capsule Take 5 mg by mouth at bedtime.     . rivaroxaban (XARELTO) 20 MG TABS tablet TAKE 1 TABLET (20 MG TOTAL) BY MOUTH DAILY WITH SUPPER. 90 tablet 1  . Secukinumab 150 MG/ML SOAJ Inject 300 mg into the skin every 28 (twenty-eight) days. Reported on 08/17/2015    . testosterone cypionate (DEPO-TESTOSTERONE) 200 MG/ML injection Inject 0.25 mLs (50 mg total) into the muscle once a week. (Patient taking differently: Inject 100 mg into the muscle once a week. Friday) 10 mL 0  . venlafaxine XR (EFFEXOR-XR) 150 MG 24 hr capsule Take 300 mg by mouth daily with breakfast.    . ondansetron (ZOFRAN) 4  MG tablet Take 1 tablet (4 mg total) by mouth every 8 (eight) hours as needed for nausea or vomiting. 30 tablet 0   No current facility-administered medications for this visit.     REVIEW OF SYSTEMS:   Constitutional: Denies fevers, chills (+) sweating due to pain Eyes: Denies blurriness of vision, double vision or watery eyes Ears, nose, mouth, throat, and face: Denies mucositis or sore throat, (+) Dry mouth Respiratory: Denies cough, dyspnea or wheezes Cardiovascular: Denies palpitation, chest discomfort or lower extremity swelling Gastrointestinal:  Denies nausea, heartburn, (+) change in bowel habits that is Constipation (+) nausea due to pain  Skin: Hx of psoriasis and arthropathy from it.  Lymphatics: Denies new lymphadenopathy or easy bruising, hx of right parotitis twice. Neurological: tingling or new weaknesses, recent TIA March 2015 (+) Numbness knees to feet MSK: sever back pain due to spinal compression Behavioral/Psych: Mood is stable, no new changes, +depression and anxiety, +memory problems All other systems were reviewed with the patient and are negative.  PHYSICAL EXAMINATION: ECOG PERFORMANCE STATUS: 3 Weight 239 pounds, temperature 98.1, respiratory rate 18, blood pressure 138/80, heart rate 106, pulse ox 100% on room air. Vitals:   07/09/16 1015  BP: 118/73  Pulse: (!) 109  Resp: 17  Temp: 98.4 F (36.9 C)  TempSrc: Oral  SpO2: 97%  Height: 5' 8.5" (1.74 m)    GENERAL:alert, no distress and comfortable, in a wheelchair  SKIN: skin color, texture, turgor are normal, no rashes or significant lesions EYES: normal, conjunctiva are pink and non-injected, sclera clear except small bleeding in the inner of right sclera  OROPHARYNX:no exudate, no erythema and lips, buccal mucosa, and tongue normal  NECK: supple, thyroid normal size, non-tender, without nodularity LYMPH:  no palpable lymphadenopathy in  the cervical, axillary or inguinal LUNGS: clear to auscultation  and percussion with normal breathing effort HEART: regular rate & rhythm and no murmurs and no lower extremity edema ABDOMEN:abdomen soft, non-tender and normal bowel sounds Musculoskeletal:no cyanosis of digits and no clubbing  PSYCH: alert & oriented x 3 NEURO: no focal motor/sensory deficits EXT: The surgical scar at the left knee has healed well, the range of motion of left knee has improved. No pitting edema.  LABORATORY DATA:  I have reviewed the data as listed CBC Latest Ref Rng & Units 07/09/2016 05/14/2016 03/12/2016  WBC 4.0 - 10.3 10e3/uL 3.6(L) 7.2 3.6(L)  Hemoglobin 13.0 - 17.1 g/dL 12.3(L) 12.5(L) 11.7(L)  Hematocrit 38.4 - 49.9 % 38.1(L) 39.0 36.5(L)  Platelets 140 - 400 10e3/uL 196 228 177   CMP Latest Ref Rng & Units 07/09/2016 05/14/2016 03/12/2016  Glucose 70 - 140 mg/dl 111 125 107  BUN 7.0 - 26.0 mg/dL 10.8 12.3 16.4  Creatinine 0.7 - 1.3 mg/dL 1.1 1.2 1.3  Sodium 136 - 145 mEq/L 138 137 139  Potassium 3.5 - 5.1 mEq/L 4.2 4.1 3.7  Chloride 101 - 111 mmol/L - - -  CO2 22 - 29 mEq/L 22 22 27   Calcium 8.4 - 10.4 mg/dL 9.1 9.5 9.0  Total Protein 6.4 - 8.3 g/dL 6.5 7.2 6.8  Total Bilirubin 0.20 - 1.20 mg/dL 0.34 0.58 0.35  Alkaline Phos 40 - 150 U/L 258(H) 457(H) 482(H)  AST 5 - 34 U/L 33 35(H) 30  ALT 0 - 55 U/L 37 61(H) 39     Pathology report Diagnosis Bone Marrow, Aspirate,Biopsy, and Clot, right iliac - SLIGHTLY HYPERCELLULAR BONE MARROW FOR AGE WITH TRILINEAGE HEMATOPOIESIS. - SEE COMMENT. PERIPHERAL BLOOD: - NO SIGNIFICANT MORPHOLOGIC ABNORMALITIES. Diagnosis Note The bone marrow is slightly hypercellular with trilineage hematopoiesis and essentially orderly and progressive maturation of all cell types. Significant dyspoiesis is not present. Iron stores are markedly decreased to absent and hence correlation with serum iron studies is recommended. There is no morphologic evidence of a myeloproliferative process in this material. Correlation with cytogenetic  studies is recommended. (BNS:ecj 04-09-14)   RADIOGRAPHIC STUDIES: No results found.  MRI Lumbar Spine WO Contrast 04/16/16 IMPRESSION: 1. Advanced L5-S1 disc degeneration with mild bilateral neural foraminal stenosis. 2. Shallow left foraminal disc protrusion at L3-4 and mild disc bulging at L4-5 without stenosis. 3. Mild multilevel posterior element hypertrophy  MRI Cervical Spine WO Contrast 04/16/16 IMPRESSION: 1. No significant interval change in size and appearance of syrinx extending from C4 through C6 as above. 2. Mild multilevel degenerative spondylolysis as above without significant canal or foraminal stenosis.   ASSESSMENT & PLAN: 51 y.o. transgender male  72. 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 He has slightly hypercellular bone marrow with trilineage hematopoiesis.  No morphology evidence of myeloproliferative process. Cytogenetics was normal (no Y chromosome).  JAK2 mutation was negative. -He had Hemoglobin 18.7 hematocrit 53.1 in 04/2013. His epo level was normal. His presentation is consistent with secondary polycythemia, likely secondary to testosterone injections. 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 Cordelia Pen advise to low his testosterone injection dose.  -We previously discussed that secondary polycythemia is usually benign, risk of thrombosis is relatively low than PV. However he did have history of stroke, and he previously feels fatigued when his hemoglobin is high. He agrees to continue  phlebotomy as needed. -Due to his multiple orthopedic surgeries, he has developed mild anemia, has not required phlebotomy lately. -he will continue Xarelto  -On 07/09/16 his labs reviewed, mild anemia, no need for phlebotomy, continue monitoring   2. History of DVT and stroke,  Protein C deficiency  - continue Xarelto indefinitely given his underlying protein C deficiency and history of DVT and stroke. His previous DVT was provoked by foot injury. -Continue Xarelto indefinitely. Okay to hold for 1-2 days before joint injection by his rheumatologist.  3. History of CVA  4. 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 I prefer him to maintain low normal level of testosterone.   5. RA -He is on Secukinumab.  6. Elevated alkaline phosphatase and transaminitis -He has intermittent mild transaminitis, resolved now. He also has significantly elevated alkaline phosphatase  -His primary care physician has noticed the above change and will follow up on that.   7. Back Pain -Has gets nausea and sweats from pain -I order Zofran for nausea    PLAN: -labs in 3 months, phlebotomy if HCT>45% -labs and f/u in 6 months   All questions were answered. The patient knows to call the clinic with any problems, questions or concerns.  I spent 20 minutes counseling the patient face to face. The total time spent in the appointment was 25 minutes and more than 50% was on counseling.   [name]  07/09/2016

## 2016-07-07 NOTE — Telephone Encounter (Signed)
Sorry I sent this to you. It should've been sent to Dr. [name].

## 2016-07-08 ENCOUNTER — Ambulatory Visit (INDEPENDENT_AMBULATORY_CARE_PROVIDER_SITE_OTHER): Payer: BLUE CROSS/BLUE SHIELD | Admitting: Psychology

## 2016-07-08 ENCOUNTER — Telehealth: Payer: Self-pay | Admitting: *Deleted

## 2016-07-08 DIAGNOSIS — F4312 Post-traumatic stress disorder, chronic: Secondary | ICD-10-CM | POA: Diagnosis not present

## 2016-07-08 NOTE — Telephone Encounter (Signed)
Urine drug screen for this encounter is consistent for prescribed medication 

## 2016-07-09 ENCOUNTER — Telehealth: Payer: Self-pay | Admitting: Hematology

## 2016-07-09 ENCOUNTER — Other Ambulatory Visit (HOSPITAL_BASED_OUTPATIENT_CLINIC_OR_DEPARTMENT_OTHER): Payer: BLUE CROSS/BLUE SHIELD

## 2016-07-09 ENCOUNTER — Ambulatory Visit (HOSPITAL_BASED_OUTPATIENT_CLINIC_OR_DEPARTMENT_OTHER): Payer: BLUE CROSS/BLUE SHIELD | Admitting: Hematology

## 2016-07-09 ENCOUNTER — Encounter: Payer: Self-pay | Admitting: Hematology

## 2016-07-09 ENCOUNTER — Encounter: Payer: Self-pay | Admitting: Physical Medicine & Rehabilitation

## 2016-07-09 VITALS — BP 118/73 | HR 109 | Temp 98.4°F | Resp 17 | Ht 68.5 in

## 2016-07-09 DIAGNOSIS — D751 Secondary polycythemia: Secondary | ICD-10-CM

## 2016-07-09 DIAGNOSIS — D6859 Other primary thrombophilia: Secondary | ICD-10-CM | POA: Diagnosis not present

## 2016-07-09 DIAGNOSIS — Z86718 Personal history of other venous thrombosis and embolism: Secondary | ICD-10-CM | POA: Diagnosis not present

## 2016-07-09 DIAGNOSIS — M549 Dorsalgia, unspecified: Secondary | ICD-10-CM | POA: Diagnosis not present

## 2016-07-09 DIAGNOSIS — Z7901 Long term (current) use of anticoagulants: Secondary | ICD-10-CM

## 2016-07-09 DIAGNOSIS — F640 Transsexualism: Secondary | ICD-10-CM | POA: Diagnosis not present

## 2016-07-09 DIAGNOSIS — Z789 Other specified health status: Secondary | ICD-10-CM

## 2016-07-09 LAB — COMPREHENSIVE METABOLIC PANEL
ALT: 37 U/L (ref 0–55)
AST: 33 U/L (ref 5–34)
Albumin: 4 g/dL (ref 3.5–5.0)
Alkaline Phosphatase: 258 U/L — ABNORMAL HIGH (ref 40–150)
Anion Gap: 9 mEq/L (ref 3–11)
BUN: 10.8 mg/dL (ref 7.0–26.0)
CHLORIDE: 106 meq/L (ref 98–109)
CO2: 22 meq/L (ref 22–29)
CREATININE: 1.1 mg/dL (ref 0.7–1.3)
Calcium: 9.1 mg/dL (ref 8.4–10.4)
EGFR: 74 mL/min/{1.73_m2} — ABNORMAL LOW (ref >90)
Glucose: 111 mg/dl (ref 70–140)
Potassium: 4.2 mEq/L (ref 3.5–5.1)
Sodium: 138 mEq/L (ref 136–145)
Total Bilirubin: 0.34 mg/dL (ref 0.20–1.20)
Total Protein: 6.5 g/dL (ref 6.4–8.3)

## 2016-07-09 LAB — CBC WITH DIFFERENTIAL/PLATELET
BASO%: 0.1 % (ref 0.0–2.0)
Basophils Absolute: 0 10*3/uL (ref 0.0–0.1)
EOS%: 0 % (ref 0.0–7.0)
Eosinophils Absolute: 0 10*3/uL (ref 0.0–0.5)
HEMATOCRIT: 38.1 % — AB (ref 38.4–49.9)
HGB: 12.3 g/dL — ABNORMAL LOW (ref 13.0–17.1)
LYMPH#: 1.2 10*3/uL (ref 0.9–3.3)
LYMPH%: 32.6 % (ref 14.0–49.0)
MCH: 24 pg — ABNORMAL LOW (ref 27.2–33.4)
MCHC: 32.2 g/dL (ref 32.0–36.0)
MCV: 74.7 fL — ABNORMAL LOW (ref 79.3–98.0)
MONO#: 0.3 10*3/uL (ref 0.1–0.9)
MONO%: 7.6 % (ref 0.0–14.0)
NEUT#: 2.2 10*3/uL (ref 1.5–6.5)
NEUT%: 59.7 % (ref 39.0–75.0)
Platelets: 196 10*3/uL (ref 140–400)
RBC: 5.1 10*6/uL (ref 4.20–5.82)
RDW: 16.9 % — ABNORMAL HIGH (ref 11.0–14.6)
WBC: 3.6 10*3/uL — AB (ref 4.0–10.3)

## 2016-07-09 MED ORDER — ONDANSETRON HCL 4 MG PO TABS: 4.0000 mg | ORAL_TABLET | Freq: Three times a day (TID) | ORAL | 0 refills | Status: DC | PRN

## 2016-07-09 NOTE — Telephone Encounter (Signed)
Appointments scheduled per 5.9.18 LOS. Patient given AVS report.

## 2016-07-15 ENCOUNTER — Ambulatory Visit (INDEPENDENT_AMBULATORY_CARE_PROVIDER_SITE_OTHER): Payer: BLUE CROSS/BLUE SHIELD | Admitting: Psychology

## 2016-07-15 DIAGNOSIS — F4312 Post-traumatic stress disorder, chronic: Secondary | ICD-10-CM | POA: Diagnosis not present

## 2016-07-15 MED ORDER — NONFORMULARY OR COMPOUNDED ITEM: 1.0000 "application " | Freq: Three times a day (TID) | 5 refills | Status: DC

## 2016-07-15 NOTE — Telephone Encounter (Signed)
I have refilled Jesse Bowers's compounded cream that he requested

## 2016-07-16 ENCOUNTER — Telehealth: Payer: Self-pay | Admitting: *Deleted

## 2016-07-16 MED ORDER — NONFORMULARY OR COMPOUNDED ITEM: 1.0000 "application " | Freq: Three times a day (TID) | 5 refills | Status: DC

## 2016-07-16 NOTE — Telephone Encounter (Signed)
I called refill in to [facility] and told them to make one fill for [facility].  I notified him through [facility].

## 2016-07-17 ENCOUNTER — Ambulatory Visit (INDEPENDENT_AMBULATORY_CARE_PROVIDER_SITE_OTHER): Payer: BLUE CROSS/BLUE SHIELD | Admitting: Psychology

## 2016-07-17 DIAGNOSIS — F4310 Post-traumatic stress disorder, unspecified: Secondary | ICD-10-CM

## 2016-07-22 ENCOUNTER — Ambulatory Visit (INDEPENDENT_AMBULATORY_CARE_PROVIDER_SITE_OTHER): Payer: BLUE CROSS/BLUE SHIELD | Admitting: Psychology

## 2016-07-22 DIAGNOSIS — F4312 Post-traumatic stress disorder, chronic: Secondary | ICD-10-CM | POA: Diagnosis not present

## 2016-07-25 ENCOUNTER — Ambulatory Visit (INDEPENDENT_AMBULATORY_CARE_PROVIDER_SITE_OTHER): Payer: BLUE CROSS/BLUE SHIELD | Admitting: Psychology

## 2016-07-25 DIAGNOSIS — F4310 Post-traumatic stress disorder, unspecified: Secondary | ICD-10-CM | POA: Diagnosis not present

## 2016-07-28 ENCOUNTER — Ambulatory Visit (INDEPENDENT_AMBULATORY_CARE_PROVIDER_SITE_OTHER): Payer: BLUE CROSS/BLUE SHIELD | Admitting: Psychology

## 2016-07-28 ENCOUNTER — Ambulatory Visit: Payer: BLUE CROSS/BLUE SHIELD | Admitting: Psychology

## 2016-07-28 DIAGNOSIS — F4312 Post-traumatic stress disorder, chronic: Secondary | ICD-10-CM | POA: Diagnosis not present

## 2016-07-29 ENCOUNTER — Ambulatory Visit (INDEPENDENT_AMBULATORY_CARE_PROVIDER_SITE_OTHER): Payer: BLUE CROSS/BLUE SHIELD | Admitting: Psychology

## 2016-08-04 ENCOUNTER — Encounter: Payer: BLUE CROSS/BLUE SHIELD | Admitting: Physical Medicine & Rehabilitation

## 2016-08-05 ENCOUNTER — Ambulatory Visit: Payer: BLUE CROSS/BLUE SHIELD | Admitting: Psychology

## 2016-08-07 ENCOUNTER — Ambulatory Visit (INDEPENDENT_AMBULATORY_CARE_PROVIDER_SITE_OTHER): Payer: BLUE CROSS/BLUE SHIELD | Admitting: Psychology

## 2016-08-07 ENCOUNTER — Encounter: Payer: Self-pay | Admitting: Hematology

## 2016-08-07 DIAGNOSIS — F4310 Post-traumatic stress disorder, unspecified: Secondary | ICD-10-CM

## 2016-08-08 ENCOUNTER — Encounter: Payer: BLUE CROSS/BLUE SHIELD | Attending: Physical Medicine & Rehabilitation | Admitting: Registered Nurse

## 2016-08-08 ENCOUNTER — Encounter: Payer: Self-pay | Admitting: Registered Nurse

## 2016-08-08 VITALS — BP 113/78 | HR 92

## 2016-08-08 DIAGNOSIS — M25562 Pain in left knee: Secondary | ICD-10-CM | POA: Diagnosis not present

## 2016-08-08 DIAGNOSIS — Z9181 History of falling: Secondary | ICD-10-CM | POA: Diagnosis not present

## 2016-08-08 DIAGNOSIS — M545 Low back pain: Secondary | ICD-10-CM | POA: Insufficient documentation

## 2016-08-08 DIAGNOSIS — M25511 Pain in right shoulder: Secondary | ICD-10-CM

## 2016-08-08 DIAGNOSIS — G8929 Other chronic pain: Secondary | ICD-10-CM

## 2016-08-08 DIAGNOSIS — Z7901 Long term (current) use of anticoagulants: Secondary | ICD-10-CM | POA: Insufficient documentation

## 2016-08-08 DIAGNOSIS — S43002A Unspecified subluxation of left shoulder joint, initial encounter: Secondary | ICD-10-CM | POA: Diagnosis not present

## 2016-08-08 DIAGNOSIS — Z86718 Personal history of other venous thrombosis and embolism: Secondary | ICD-10-CM | POA: Insufficient documentation

## 2016-08-08 DIAGNOSIS — M7522 Bicipital tendinitis, left shoulder: Secondary | ICD-10-CM | POA: Insufficient documentation

## 2016-08-08 DIAGNOSIS — Z79899 Other long term (current) drug therapy: Secondary | ICD-10-CM | POA: Insufficient documentation

## 2016-08-08 DIAGNOSIS — Z96652 Presence of left artificial knee joint: Secondary | ICD-10-CM

## 2016-08-08 DIAGNOSIS — I69398 Other sequelae of cerebral infarction: Secondary | ICD-10-CM | POA: Insufficient documentation

## 2016-08-08 DIAGNOSIS — F418 Other specified anxiety disorders: Secondary | ICD-10-CM | POA: Diagnosis not present

## 2016-08-08 DIAGNOSIS — Z5181 Encounter for therapeutic drug level monitoring: Secondary | ICD-10-CM | POA: Diagnosis not present

## 2016-08-08 DIAGNOSIS — Z87890 Personal history of sex reassignment: Secondary | ICD-10-CM | POA: Diagnosis not present

## 2016-08-08 DIAGNOSIS — I69351 Hemiplegia and hemiparesis following cerebral infarction affecting right dominant side: Secondary | ICD-10-CM | POA: Diagnosis not present

## 2016-08-08 DIAGNOSIS — G894 Chronic pain syndrome: Secondary | ICD-10-CM | POA: Diagnosis not present

## 2016-08-08 DIAGNOSIS — M519 Unspecified thoracic, thoracolumbar and lumbosacral intervertebral disc disorder: Secondary | ICD-10-CM | POA: Diagnosis not present

## 2016-08-08 DIAGNOSIS — M13862 Other specified arthritis, left knee: Secondary | ICD-10-CM | POA: Diagnosis not present

## 2016-08-08 DIAGNOSIS — D6859 Other primary thrombophilia: Secondary | ICD-10-CM | POA: Insufficient documentation

## 2016-08-08 DIAGNOSIS — L4050 Arthropathic psoriasis, unspecified: Secondary | ICD-10-CM | POA: Diagnosis not present

## 2016-08-08 DIAGNOSIS — I69320 Aphasia following cerebral infarction: Secondary | ICD-10-CM | POA: Diagnosis not present

## 2016-08-08 DIAGNOSIS — D751 Secondary polycythemia: Secondary | ICD-10-CM | POA: Diagnosis not present

## 2016-08-08 DIAGNOSIS — M170 Bilateral primary osteoarthritis of knee: Secondary | ICD-10-CM

## 2016-08-08 DIAGNOSIS — Z79891 Long term (current) use of opiate analgesic: Secondary | ICD-10-CM | POA: Diagnosis not present

## 2016-08-08 DIAGNOSIS — M25561 Pain in right knee: Secondary | ICD-10-CM

## 2016-08-08 DIAGNOSIS — K900 Celiac disease: Secondary | ICD-10-CM | POA: Insufficient documentation

## 2016-08-08 DIAGNOSIS — R209 Unspecified disturbances of skin sensation: Secondary | ICD-10-CM | POA: Diagnosis not present

## 2016-08-08 MED ORDER — MORPHINE SULFATE ER 60 MG PO CP24: 60.0000 mg | ORAL_CAPSULE | Freq: Two times a day (BID) | ORAL | 0 refills | Status: DC

## 2016-08-08 MED ORDER — OXYCODONE HCL 10 MG PO TABS: 10.0000 mg | ORAL_TABLET | Freq: Three times a day (TID) | ORAL | 0 refills | Status: DC | PRN

## 2016-08-08 NOTE — Progress Notes (Signed)
Subjective:    Patient ID: Jesse Bowers, male    DOB: 12-11-1965, 51 y.o.   MRN: [id number]  HPI: [name] is a 51year old male who returns for follow-up appointment for chronic pain and medication refill. He states his pain is located in his right shoulder,lower back and left knee.He rates his pain 9. His current exercise regime iswalking with his cane short distances in his home and performing stretching exercises.  Also states five weeks ago he was sitting outside when he stood up to pick up the ball for his dog, he tripped over the dog and fell forward. He was able to get up, he followed up with his Orthopedist Dr. Onnie Graham, he states he has a right shoulder fracture and scheduled for surgery on 08/15/2016 at the [facility].   Last UDS was performed on 07/08/2016, it was consistent.   S/PLeft Total Knee Revision on 02/06/2016 by Dr. Wynelle Link  Pain Inventory Average Pain 7 Pain Right Now 9 My pain is intermittent, constant, sharp, dull, stabbing, tingling and aching  In the last 24 hours, has pain interfered with the following? General activity 9 Relation with others 9 Enjoyment of life 9 What TIME of day is your pain at its worst? daytime and evening Sleep (in general) NA  Pain is worse with: walking, bending and standing Pain improves with: rest, heat/ice, medication and injections Relief from Meds: 5  Mobility walk with assistance how many minutes can you walk? 5 ability to climb steps?  yes do you drive?  yes  Function disabled: date disabled 01/2013 I need assistance with the following:  dressing, bathing, meal prep, household duties and shopping  Neuro/Psych bladder control problems weakness numbness tremor trouble walking spasms anxiety  Prior Studies Any changes since last visit?  no  Physicians involved in your care Any changes since last visit?  no   Family History  Problem Relation Age of Onset  . Stroke Maternal Grandfather       75  . Heart attack Maternal Grandfather   . Hypertension Mother   . Psoriasis Mother   . Cancer Paternal Grandfather   . Heart attack Paternal Grandfather   . Stroke Paternal Uncle        age 46  . Stroke Maternal Grandmother   . Congestive Heart Failure Maternal Grandmother   . Heart attack Maternal Grandmother   . Protein C deficiency Sister 69       Miscarriages   Social History   Social History  . Marital status: Married    Spouse name: N/A  . Number of children: 2  . 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 Smoking status: Never Smoker  . Smokeless tobacco: Never Used  . Alcohol use Yes     Comment: wine once a month  . Drug use: No  . Sexual activity: Yes    Birth control/ protection: None     Comment: patient is a transgender on testosterone shots, no biological kids   Other Topics Concern  . None   Social History Narrative   Education 4 year college, former Therapist, sports X 15 years, pediatric nurse practitioner x 6 years, did NP degree from [employer] [place]. Relocated to [place] about 2 months ago from [place], [place]. Patient was in [place] for last 4 years and prior to that in [place]. His wife  is working as Scientist, research (physical sciences) for [employer]. Patient is not working and applying for disability. They have 2 kids but no biologic children.    Past Surgical History:  Procedure Laterality Date  . ABDOMINAL HYSTERECTOMY     TAH, BSO- tranverse incision.  [name] ANKLE ARTHROSCOPY WITH RECONSTRUCTION Right 2007  . CHOLECYSTECTOMY     laparoscopic  . HIP ARTHROSCOPY W/ LABRAL REPAIR Right 05/11/2013  . KNEE SURGERY Bilateral 1984   Right ACL, left PCL repair  . LIVER BIOPSY  2013   normal results.  [name] NASAL SEPTUM SURGERY N/A 09/20/2015  . THYROIDECTOMY, PARTIAL  2008  . TOTAL KNEE REVISION Left 02/06/2016   Procedure: LEFT TOTAL KNEE REVISION;  Surgeon: [name],  MD;  Location: [facility];  Service: Orthopedics;  Laterality: Left;   Past Medical History:  Diagnosis Date  . Abnormal weight loss   . Anxiety    PTSD per patient  . Celiac disease   . Clotting disorder ([place])    Dr. Anne Fu  . Gender bias    prefers Male gender identity- "remains with vagina"  . Gluten enteropathy   . H/O parotitis    right   . H/O protein C deficiency   . Hx-TIA (transient ischemic attack)    2015  . Neck pain   . Neuromuscular disorder ([place])    bilateral neuropathy feet.  . Polycythemia, secondary   . PONV (postoperative nausea and vomiting)   . psoriatic arthritis   . Sleep apnea    cpap use- Dr. [name] follows  . Stroke [facility])    Stroke left side -slight right sided weakness-Dr. [name] follows  . Syrinx of spinal cord ([place]) 01/06/2014 on MRI   c spine  . Transfusion history    past history- none recent  . Transgendered    BP 113/78 (BP Location: Left Arm, Patient Position: Sitting, Cuff Size: Normal)   Pulse 92   SpO2 99%   Opioid Risk Score: 0   Fall Risk Score:  `1  Depression screen PHQ 2/9  Depression screen [facility] 2/9 06/18/2015 05/24/2015  Decreased Interest 2 2  Down, Depressed, Hopeless 0 0  PHQ - 2 Score 2 2  Some recent data might be hidden    Review of Systems  HENT: Negative.   Eyes: Negative.   Respiratory: Negative.   Cardiovascular: Negative.   Gastrointestinal: Negative.   Endocrine: Negative.   Genitourinary: Negative.   Musculoskeletal: Positive for gait problem.  Skin: Negative.   Neurological: Positive for tremors and weakness.  Hematological: Negative.   Psychiatric/Behavioral: The patient is nervous/anxious.   All other systems reviewed and are negative.      Objective:   Physical Exam  Constitutional: He is oriented to person, place, and time. He appears well-developed and well-nourished.  HENT:  Head: Normocephalic and atraumatic.  Neck: Normal range of motion. Neck supple.  Cardiovascular: Normal rate and  regular rhythm.   Pulmonary/Chest: Effort normal and breath sounds normal.  Musculoskeletal:  Normal Muscle Bulk and Muscle Testing Reveals: Upper Extremities: Right: Decreased ROM 90 Degrees and Muscle Strength 4/5 Left: Full ROM and Muscle Strength 5/5 Lumbar Paraspinal Tenderness: L-3-L-5 Lower Extremities: Full ROM and Muscle Strength 5/5 Bilateral Lower Extremities Flexion Produces Pain into Bilateral Patella's Arrived in wheelchair   Neurological: He is alert and oriented to person, place, and time.  Skin: Skin is warm and dry.  Psychiatric: He has a normal mood and affect.  Nursing note and vitals reviewed.  Assessment & Plan:  1. Psoriatic arthritis with pain in multiple areas, most prominently feet, hands, elbows. Refilled:Kadian 60 mg 24 hr. Capsule, one capsule every 12 hours #60 and Oxycodone 10 mg one tablet every 8 hours as needed #90. 08/08/2016 We will continue the opioid monitoring program, this consists of regular clinic visits, examinations, urine drug screen, pill counts as well as use of [place] Controlled Substance Reporting System.  Rheumatology Following. 2. Chronic Pain Syndrome: Continue Compound Cream. 08/08/2016 3. 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 Patello-femoral arthritis left knee: Continue to monitor. 08/08/2016 S/P TKR on 07/03/14: Ortho Following: Dr. Wynelle Link perform Total Left Knee Revision on 02/06/2016. 08/08/2016 5. Chronic mid- low back pain: Continue current medication regime, and encourage to increase activity as tolerated.08/08/2016 6. Polycythemia: Oncology Following. 08/08/2016 7. Depression with anxiety : Continue to Monitor. 08/08/2016 8. Muscle Spasm: Continue Flexeril. 08/08/2016 9. Left Ankle Pain: No complaints today.  Continue to Monitor 08/08/2016.  10. Bilateral  lateral epicondylitis: Ortho Following.No complaints today. 08/08/2016 11. Right Shoulder Pain: Ortho Following. 08/08/2016 12. Lumbar Radiculitis: S/P Left Lumbar ESI with good relief noted. 08/08/2016.  30 minutes of face to face patient care time was spent during this visit. All questions were encouraged and answered.   F/U in 1 month

## 2016-08-12 ENCOUNTER — Ambulatory Visit (INDEPENDENT_AMBULATORY_CARE_PROVIDER_SITE_OTHER): Payer: BLUE CROSS/BLUE SHIELD | Admitting: Psychology

## 2016-08-12 DIAGNOSIS — F4312 Post-traumatic stress disorder, chronic: Secondary | ICD-10-CM

## 2016-08-14 ENCOUNTER — Ambulatory Visit: Payer: BLUE CROSS/BLUE SHIELD | Admitting: Psychology

## 2016-08-14 ENCOUNTER — Telehealth: Payer: Self-pay | Admitting: *Deleted

## 2016-08-14 LAB — 6-ACETYLMORPHINE,TOXASSURE ADD
6-ACETYLMORPHINE: NEGATIVE
6-ACETYLMORPHINE: NOT DETECTED ng/mg{creat}

## 2016-08-14 LAB — TOXASSURE SELECT,+ANTIDEPR,UR

## 2016-08-14 NOTE — Telephone Encounter (Signed)
Urine drug screen for this encounter is consistent for prescribed medication 

## 2016-08-19 ENCOUNTER — Ambulatory Visit (INDEPENDENT_AMBULATORY_CARE_PROVIDER_SITE_OTHER): Payer: BLUE CROSS/BLUE SHIELD | Admitting: Psychology

## 2016-08-19 DIAGNOSIS — F4312 Post-traumatic stress disorder, chronic: Secondary | ICD-10-CM

## 2016-08-21 ENCOUNTER — Ambulatory Visit (INDEPENDENT_AMBULATORY_CARE_PROVIDER_SITE_OTHER): Payer: BLUE CROSS/BLUE SHIELD | Admitting: Psychology

## 2016-08-21 DIAGNOSIS — F4310 Post-traumatic stress disorder, unspecified: Secondary | ICD-10-CM | POA: Diagnosis not present

## 2016-08-26 ENCOUNTER — Ambulatory Visit: Payer: Self-pay | Admitting: Psychology

## 2016-08-28 ENCOUNTER — Ambulatory Visit (INDEPENDENT_AMBULATORY_CARE_PROVIDER_SITE_OTHER): Payer: BLUE CROSS/BLUE SHIELD | Admitting: Psychology

## 2016-08-28 ENCOUNTER — Ambulatory Visit: Payer: BLUE CROSS/BLUE SHIELD | Admitting: Psychology

## 2016-08-28 DIAGNOSIS — F4310 Post-traumatic stress disorder, unspecified: Secondary | ICD-10-CM | POA: Diagnosis not present

## 2016-09-02 ENCOUNTER — Ambulatory Visit: Payer: Self-pay | Admitting: Psychology

## 2016-09-04 ENCOUNTER — Ambulatory Visit (INDEPENDENT_AMBULATORY_CARE_PROVIDER_SITE_OTHER): Payer: BLUE CROSS/BLUE SHIELD | Admitting: Psychology

## 2016-09-04 DIAGNOSIS — F4310 Post-traumatic stress disorder, unspecified: Secondary | ICD-10-CM

## 2016-09-09 ENCOUNTER — Ambulatory Visit: Payer: Self-pay | Admitting: Psychology

## 2016-09-11 ENCOUNTER — Ambulatory Visit (INDEPENDENT_AMBULATORY_CARE_PROVIDER_SITE_OTHER): Payer: BLUE CROSS/BLUE SHIELD | Admitting: Psychology

## 2016-09-11 DIAGNOSIS — F4310 Post-traumatic stress disorder, unspecified: Secondary | ICD-10-CM

## 2016-09-15 ENCOUNTER — Encounter: Payer: Self-pay | Admitting: Registered Nurse

## 2016-09-15 ENCOUNTER — Encounter: Payer: BLUE CROSS/BLUE SHIELD | Attending: Physical Medicine & Rehabilitation | Admitting: Registered Nurse

## 2016-09-15 VITALS — BP 117/80 | HR 105

## 2016-09-15 DIAGNOSIS — Z96652 Presence of left artificial knee joint: Secondary | ICD-10-CM | POA: Diagnosis not present

## 2016-09-15 DIAGNOSIS — Z7901 Long term (current) use of anticoagulants: Secondary | ICD-10-CM | POA: Insufficient documentation

## 2016-09-15 DIAGNOSIS — M519 Unspecified thoracic, thoracolumbar and lumbosacral intervertebral disc disorder: Secondary | ICD-10-CM | POA: Diagnosis not present

## 2016-09-15 DIAGNOSIS — M13862 Other specified arthritis, left knee: Secondary | ICD-10-CM | POA: Insufficient documentation

## 2016-09-15 DIAGNOSIS — M25511 Pain in right shoulder: Secondary | ICD-10-CM | POA: Diagnosis not present

## 2016-09-15 DIAGNOSIS — I69398 Other sequelae of cerebral infarction: Secondary | ICD-10-CM | POA: Insufficient documentation

## 2016-09-15 DIAGNOSIS — S43002A Unspecified subluxation of left shoulder joint, initial encounter: Secondary | ICD-10-CM | POA: Diagnosis not present

## 2016-09-15 DIAGNOSIS — G894 Chronic pain syndrome: Secondary | ICD-10-CM

## 2016-09-15 DIAGNOSIS — M5416 Radiculopathy, lumbar region: Secondary | ICD-10-CM

## 2016-09-15 DIAGNOSIS — M7522 Bicipital tendinitis, left shoulder: Secondary | ICD-10-CM | POA: Insufficient documentation

## 2016-09-15 DIAGNOSIS — F418 Other specified anxiety disorders: Secondary | ICD-10-CM | POA: Diagnosis not present

## 2016-09-15 DIAGNOSIS — Z79891 Long term (current) use of opiate analgesic: Secondary | ICD-10-CM | POA: Insufficient documentation

## 2016-09-15 DIAGNOSIS — I69320 Aphasia following cerebral infarction: Secondary | ICD-10-CM | POA: Diagnosis not present

## 2016-09-15 DIAGNOSIS — Z86718 Personal history of other venous thrombosis and embolism: Secondary | ICD-10-CM | POA: Diagnosis not present

## 2016-09-15 DIAGNOSIS — K900 Celiac disease: Secondary | ICD-10-CM | POA: Diagnosis not present

## 2016-09-15 DIAGNOSIS — Z9181 History of falling: Secondary | ICD-10-CM | POA: Diagnosis not present

## 2016-09-15 DIAGNOSIS — Z87890 Personal history of sex reassignment: Secondary | ICD-10-CM | POA: Insufficient documentation

## 2016-09-15 DIAGNOSIS — M545 Low back pain: Secondary | ICD-10-CM | POA: Insufficient documentation

## 2016-09-15 DIAGNOSIS — D751 Secondary polycythemia: Secondary | ICD-10-CM | POA: Diagnosis not present

## 2016-09-15 DIAGNOSIS — D6859 Other primary thrombophilia: Secondary | ICD-10-CM | POA: Insufficient documentation

## 2016-09-15 DIAGNOSIS — M7712 Lateral epicondylitis, left elbow: Secondary | ICD-10-CM | POA: Diagnosis not present

## 2016-09-15 DIAGNOSIS — Z79899 Other long term (current) drug therapy: Secondary | ICD-10-CM | POA: Diagnosis not present

## 2016-09-15 DIAGNOSIS — G8929 Other chronic pain: Secondary | ICD-10-CM | POA: Diagnosis present

## 2016-09-15 DIAGNOSIS — L4050 Arthropathic psoriasis, unspecified: Secondary | ICD-10-CM

## 2016-09-15 DIAGNOSIS — I69351 Hemiplegia and hemiparesis following cerebral infarction affecting right dominant side: Secondary | ICD-10-CM | POA: Diagnosis not present

## 2016-09-15 DIAGNOSIS — Z5181 Encounter for therapeutic drug level monitoring: Secondary | ICD-10-CM

## 2016-09-15 DIAGNOSIS — R209 Unspecified disturbances of skin sensation: Secondary | ICD-10-CM | POA: Insufficient documentation

## 2016-09-15 MED ORDER — OXYCODONE HCL 10 MG PO TABS: 10.0000 mg | ORAL_TABLET | Freq: Three times a day (TID) | ORAL | 0 refills | Status: DC | PRN

## 2016-09-15 MED ORDER — MORPHINE SULFATE ER 60 MG PO CP24: 60.0000 mg | ORAL_CAPSULE | Freq: Two times a day (BID) | ORAL | 0 refills | Status: DC

## 2016-09-15 NOTE — Progress Notes (Signed)
   Subjective:    Patient ID: Jesse Bowers, male    DOB: 1965-06-26, 51 y.o.   MRN: [id number]  HPI    Review of Systems       Objective:   Physical Exam        Assessment & Plan:

## 2016-09-15 NOTE — Progress Notes (Signed)
Subjective:    Patient ID: Jesse Bowers, male    DOB: [birth year], 51 y.o.   MRN: [id number]  HPI: [name] is a 51year old male who returns for follow-up appointment for chronic pain and medication refill. He states his pain is located in his right shoulder, left elbow,,lower back radiating into his lower extremities posteriorly and left buttock.He rates his pain 8. His current exercise regime iswalking with his cane short distances in his home and performing stretching exercises.  Last UDS was performed on 07/08/2016, it was consistent.   S/PLeft Total Knee Revision on 02/06/2016 by Dr. Wynelle Link  Pain Inventory Average Pain 8 Pain Right Now 8 My pain is sharp, dull, stabbing, tingling and aching  In the last 24 hours, has pain interfered with the following? General activity 8 Relation with others 8 Enjoyment of life 10 What TIME of day is your pain at its worst? daytime Sleep (in general) Poor  Pain is worse with: walking, bending, sitting and standing Pain improves with: rest, heat/ice, medication and injections Relief from Meds: 5  Mobility walk with assistance ability to climb steps?  yes do you drive?  yes  Function disabled: date disabled 10/2012 I need assistance with the following:  feeding, dressing, bathing, meal prep, household duties and shopping Do you have any goals in this area?  no  Neuro/Psych bladder control problems weakness numbness tremor trouble walking spasms confusion anxiety  Prior Studies .  Physicians involved in your care .   Family History  Problem Relation Age of Onset  . Stroke Maternal Grandfather        62  . Heart attack Maternal Grandfather   . Hypertension Mother   . Psoriasis Mother   . Cancer Paternal Grandfather   . Heart attack Paternal Grandfather   . Stroke Paternal Uncle        age 71  . Stroke Maternal Grandmother   . Congestive Heart Failure Maternal Grandmother   . Heart attack Maternal  Grandmother   . Protein C deficiency Sister 49       Miscarriages   Social History   Social History  . Marital status: Married    Spouse name: N/A  . Number of children: 2  . 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 Smoking status: Never Smoker  . Smokeless tobacco: Never Used  . Alcohol use Yes     Comment: wine once a month  . Drug use: No  . Sexual activity: Yes    Birth control/ protection: None     Comment: patient is a transgender on testosterone shots, no biological kids   Other Topics Concern  . 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 Relocated to [place] about 2 months ago from [place], [place]. Patient was in [place] for last 4 years and prior to that in [place]. His wife is working as Scientist, research (physical sciences) for [employer]. Patient is not working and applying for disability. They have 2 kids but no biologic children.    Past Surgical History:  Procedure Laterality Date  . ABDOMINAL HYSTERECTOMY     TAH, BSO- tranverse incision.  [name] ANKLE ARTHROSCOPY WITH RECONSTRUCTION Right 2007  . CHOLECYSTECTOMY     laparoscopic  .  HIP ARTHROSCOPY W/ LABRAL REPAIR Right 05/11/2013  . KNEE SURGERY Bilateral 1984   Right ACL, left PCL repair  . LIVER BIOPSY  2013   normal results.  [name] NASAL SEPTUM SURGERY N/A 09/20/2015  . THYROIDECTOMY, PARTIAL  2008  . TOTAL KNEE REVISION Left 02/06/2016   Procedure: LEFT TOTAL KNEE REVISION;  Surgeon: [name], [place];  Location: [facility];  Service: Orthopedics;  Laterality: Left;   Past Medical History:  Diagnosis Date  . Abnormal weight loss   . Anxiety    PTSD per patient  . Celiac disease   . Clotting disorder ([place])    Dr. Anne Fu  . Gender bias    prefers Male gender identity- "remains with vagina"  .  Gluten enteropathy   . H/O parotitis    right   . H/O protein C deficiency   . Hx-TIA (transient ischemic attack)    2015  . Neck pain   . Neuromuscular disorder ([place])    bilateral neuropathy feet.  . Polycythemia, secondary   . PONV (postoperative nausea and vomiting)   . psoriatic arthritis   . Sleep apnea    cpap use- Dr. [name] follows  . Stroke [facility])    Stroke left side -slight right sided weakness-Dr. [name] follows  . Syrinx of spinal cord ([place]) 01/06/2014 on MRI   c spine  . Transfusion history    past history- none recent  . Transgendered    BP 117/80   Pulse (!) 105   SpO2 95%   Opioid Risk Score:   Fall Risk Score:  `1  Depression screen PHQ 2/9  Depression screen [facility] 2/9 09/15/2016 06/18/2015 05/24/2015  Decreased Interest 0 2 2  Down, Depressed, Hopeless 0 0 0  PHQ - 2 Score 0 2 2  Some recent data might be hidden      Review of Systems  Constitutional: Negative.   HENT: Negative.   Eyes: Negative.   Respiratory: Negative.   Cardiovascular: Negative.   Gastrointestinal: Negative.   Endocrine: Negative.   Genitourinary: Negative.   Musculoskeletal: Negative.   Skin: Negative.   Allergic/Immunologic: Negative.   Neurological: Negative.   Hematological: Negative.   Psychiatric/Behavioral: Negative.   All other systems reviewed and are negative.      Objective:   Physical Exam  Constitutional: He is oriented to person, place, and time. He appears well-developed and well-nourished.  HENT:  Head: Normocephalic and atraumatic.  Neck: Normal range of motion. Neck supple.  Cardiovascular: Normal rate and regular rhythm.   Pulmonary/Chest: Effort normal and breath sounds normal.  Musculoskeletal:  Normal Muscle Bulk and Muscle Testing Reveals: Upper Extremities: Full ROM and Muscle Strength 5/5 Right AC Joint Tenderness Lumbar Paraspinal Tenderness: L-3-L-5 Lower Extremities: Full ROM and Muscle Strength 5/5 Arrived in wheelchair  Neurological:  He is alert and oriented to person, place, and time.  Skin: Skin is warm and dry.  Psychiatric: He has a normal mood and affect.  Nursing note and vitals reviewed.         Assessment & Plan:  1. Psoriatic arthritis with pain in multiple areas, most prominently feet, hands, elbows. Refilled:Kadian 60 mg 24 hr. Capsule, one capsule every 12 hours #60 and Oxycodone 10 mg one tablet every 8 hours as needed #90. 08/08/2016 We will continue the opioid monitoring program, this consists of regular clinic visits, examinations, urine drug screen, pill counts as well as use of [place] Controlled Substance Reporting System.  Rheumatology Following. 09/15/2016 2. Chronic  Pain Syndrome: Continue Compound Cream. 09/15/2016 3. Prior left sided CVA ('s) most substantial of which in May 2015 with residual right sided weakness, sensory loss, and expressive language deficits.: Continue to Monitor.09/15/2016 4. Patello-femoral arthritis left knee: Continue to monitor. 09/15/2016 S/P TKR on 07/03/14: Ortho Following: Dr. Wynelle Link perform Total Left Knee Revision on 02/06/2016. 09/15/2016 5. Chronic mid- low back pain: Continue current medication regime, and encourage to increase activity as tolerated.09/15/2016 6. Polycythemia: Oncology Following. 09/15/2016 7. Depression with anxiety : Continue to Monitor. 09/15/2016 8. Muscle Spasm: Continue Flexeril. 09/15/2016 9. Left Ankle Pain: No complaints today.  Continue to Monitor 09/15/2016.  10. Left lateral epicondylitis: Ortho Followin. 09/15/2016 11. Right Shoulder Pain: Ortho Following. 09/15/2016 12. Lumbar Radiculitis: Schedule for  ESI with Dr. Letta Pate. 09/15/2016.  20 minutes of face to face patient care time was spent during this visit. All questions were encouraged and answered.   F/U in 1 month

## 2016-09-16 ENCOUNTER — Ambulatory Visit (INDEPENDENT_AMBULATORY_CARE_PROVIDER_SITE_OTHER): Payer: BLUE CROSS/BLUE SHIELD | Admitting: Psychology

## 2016-09-16 DIAGNOSIS — F4312 Post-traumatic stress disorder, chronic: Secondary | ICD-10-CM

## 2016-09-18 ENCOUNTER — Ambulatory Visit (INDEPENDENT_AMBULATORY_CARE_PROVIDER_SITE_OTHER): Payer: BLUE CROSS/BLUE SHIELD | Admitting: Psychology

## 2016-09-18 DIAGNOSIS — F4310 Post-traumatic stress disorder, unspecified: Secondary | ICD-10-CM

## 2016-09-19 ENCOUNTER — Telehealth: Payer: Self-pay | Admitting: Registered Nurse

## 2016-09-19 NOTE — Telephone Encounter (Signed)
On 09/19/2016 the [place] was reviewed no conflict was seen on the [place] with multiple prescribers. Mr. Mccanless has a signed narcotic contract with our office. If there were any discrepancies this would have been reported to his physician.

## 2016-09-23 ENCOUNTER — Ambulatory Visit (INDEPENDENT_AMBULATORY_CARE_PROVIDER_SITE_OTHER): Payer: BLUE CROSS/BLUE SHIELD | Admitting: Psychology

## 2016-09-23 DIAGNOSIS — F4312 Post-traumatic stress disorder, chronic: Secondary | ICD-10-CM

## 2016-09-24 ENCOUNTER — Telehealth: Payer: Self-pay | Admitting: Physical Medicine & Rehabilitation

## 2016-09-24 NOTE — Telephone Encounter (Signed)
Return Mr. Kavanagh call, his Flexeril being prescribed by Dr. [name]. He was instructed to call Dr. [name] office. Left message with the above instructions and to call back if he has any further questions.

## 2016-09-24 NOTE — Telephone Encounter (Signed)
Patient called and asked advice on flexaril.  He state he was original on regimine for PTSD  For the muscle spasms? They are causing he and wife to wake in the middle of the night due to arm and back locations - he would like to know if he can increase the dosage.

## 2016-09-25 ENCOUNTER — Ambulatory Visit (INDEPENDENT_AMBULATORY_CARE_PROVIDER_SITE_OTHER): Payer: BLUE CROSS/BLUE SHIELD | Admitting: Psychology

## 2016-09-25 DIAGNOSIS — F4310 Post-traumatic stress disorder, unspecified: Secondary | ICD-10-CM | POA: Diagnosis not present

## 2016-10-01 ENCOUNTER — Encounter: Payer: Self-pay | Admitting: Hematology

## 2016-10-06 ENCOUNTER — Encounter: Payer: BLUE CROSS/BLUE SHIELD | Attending: Physical Medicine & Rehabilitation

## 2016-10-06 ENCOUNTER — Encounter: Payer: Self-pay | Admitting: Physical Medicine & Rehabilitation

## 2016-10-06 ENCOUNTER — Ambulatory Visit (HOSPITAL_BASED_OUTPATIENT_CLINIC_OR_DEPARTMENT_OTHER): Payer: BLUE CROSS/BLUE SHIELD | Admitting: Physical Medicine & Rehabilitation

## 2016-10-06 VITALS — BP 135/88 | HR 88 | Resp 14

## 2016-10-06 DIAGNOSIS — D751 Secondary polycythemia: Secondary | ICD-10-CM | POA: Insufficient documentation

## 2016-10-06 DIAGNOSIS — M7522 Bicipital tendinitis, left shoulder: Secondary | ICD-10-CM | POA: Diagnosis not present

## 2016-10-06 DIAGNOSIS — Z87890 Personal history of sex reassignment: Secondary | ICD-10-CM | POA: Insufficient documentation

## 2016-10-06 DIAGNOSIS — L4050 Arthropathic psoriasis, unspecified: Secondary | ICD-10-CM | POA: Insufficient documentation

## 2016-10-06 DIAGNOSIS — I69320 Aphasia following cerebral infarction: Secondary | ICD-10-CM | POA: Insufficient documentation

## 2016-10-06 DIAGNOSIS — Z79891 Long term (current) use of opiate analgesic: Secondary | ICD-10-CM | POA: Insufficient documentation

## 2016-10-06 DIAGNOSIS — K900 Celiac disease: Secondary | ICD-10-CM | POA: Diagnosis not present

## 2016-10-06 DIAGNOSIS — I69398 Other sequelae of cerebral infarction: Secondary | ICD-10-CM | POA: Insufficient documentation

## 2016-10-06 DIAGNOSIS — F418 Other specified anxiety disorders: Secondary | ICD-10-CM | POA: Diagnosis not present

## 2016-10-06 DIAGNOSIS — I69351 Hemiplegia and hemiparesis following cerebral infarction affecting right dominant side: Secondary | ICD-10-CM | POA: Insufficient documentation

## 2016-10-06 DIAGNOSIS — M545 Low back pain: Secondary | ICD-10-CM | POA: Insufficient documentation

## 2016-10-06 DIAGNOSIS — Z7901 Long term (current) use of anticoagulants: Secondary | ICD-10-CM | POA: Diagnosis not present

## 2016-10-06 DIAGNOSIS — M5416 Radiculopathy, lumbar region: Secondary | ICD-10-CM

## 2016-10-06 DIAGNOSIS — Z9181 History of falling: Secondary | ICD-10-CM | POA: Insufficient documentation

## 2016-10-06 DIAGNOSIS — Z79899 Other long term (current) drug therapy: Secondary | ICD-10-CM | POA: Diagnosis not present

## 2016-10-06 DIAGNOSIS — G8929 Other chronic pain: Secondary | ICD-10-CM | POA: Insufficient documentation

## 2016-10-06 DIAGNOSIS — S43002A Unspecified subluxation of left shoulder joint, initial encounter: Secondary | ICD-10-CM | POA: Diagnosis not present

## 2016-10-06 DIAGNOSIS — D6859 Other primary thrombophilia: Secondary | ICD-10-CM | POA: Insufficient documentation

## 2016-10-06 DIAGNOSIS — Z86718 Personal history of other venous thrombosis and embolism: Secondary | ICD-10-CM | POA: Insufficient documentation

## 2016-10-06 DIAGNOSIS — M13862 Other specified arthritis, left knee: Secondary | ICD-10-CM | POA: Insufficient documentation

## 2016-10-06 DIAGNOSIS — R209 Unspecified disturbances of skin sensation: Secondary | ICD-10-CM | POA: Diagnosis not present

## 2016-10-06 MED ORDER — MORPHINE SULFATE ER 60 MG PO CP24: 60.0000 mg | ORAL_CAPSULE | Freq: Two times a day (BID) | ORAL | 0 refills | Status: DC

## 2016-10-06 MED ORDER — OXYCODONE HCL 10 MG PO TABS: 10.0000 mg | ORAL_TABLET | Freq: Three times a day (TID) | ORAL | 0 refills | Status: DC | PRN

## 2016-10-06 NOTE — Progress Notes (Signed)
  PROCEDURE RECORD [facility]   Name: Taavi Hoose DOB:12-05-1965 MRN: [id number]  Date:10/06/2016  Physician: Alysia Penna, MD    Nurse/CMA: Bright, CMA  Allergies:  Allergies  Allergen Reactions  . Penicillin G Anaphylaxis    Has patient had a PCN reaction causing immediate rash, facial/tongue/throat swelling, SOB or lightheadedness with hypotension: Yes Has patient had a PCN reaction causing severe rash involving mucus membranes or skin necrosis: No Has patient had a PCN reaction that required hospitalization Yes Has patient had a PCN reaction occurring within the last 10 years: No If all of the above answers are "NO", then may proceed with Cephalosporin use.   . Vancomycin Rash  . Wheat Extract Other (See Comments)    CELIAC DISEASE  . Duloxetine Other (See Comments)    Restless legs  . Gabapentin Nausea Only  . Acetaminophen     Elevates liver enzymes.   . Ibuprofen     Contraindicated with Xarelto.   . Sulfa Antibiotics Rash    Stevens-Johnson rash    Consent Signed: Yes.    Is patient diabetic? No.  CBG today?   Pregnant: No. LMP: No LMP for male patient. (age 42-55)  Anticoagulants: no Anti-inflammatory: no Antibiotics: no  Procedure: transforaminal ESI Position: Prone Start Time:   End Time:   Fluoro Time:   RN/CMA Brittinee Risk, CMA Bright, CMA    Time 3:35pm     BP 147/84     Pulse 101     Respirations 14 14    O2 Sat 94     S/S 6 6    Pain Level 7/10      D/C home with wife, patient A & O X 3, D/C instructions reviewed, and sits independently.

## 2016-10-06 NOTE — Progress Notes (Signed)
Lumbar transforaminal epidural steroid injection under fluoroscopic guidance  Indication: Lumbosacral radiculitis is not relieved by medication management or other conservative care and interfering with self-care and mobility.   Informed consent was obtained after describing risk and benefits of the procedure with the patient, this includes bleeding, bruising, infection, paralysis and medication side effects.  The patient wishes to proceed and has given written consent.  Patient was placed in prone position.  The lumbar area was marked and prepped with Betadine.  It was entered with a 25-gauge 1-1/2 inch needle and one mL of 1% lidocaine was injected into the skin and subcutaneous tissue.  Then a 22-gauge 5 spinal needle was inserted into the Left L5-S1 intervertebral foramen under AP, lateral, and oblique view.  Then a solution containing one mL of 10 mg per mL dexamethasone and 2 mL of 1% lidocaine was injected.  The patient tolerated procedure well.  Post procedure instructions were given.  Please see post procedure form.

## 2016-10-06 NOTE — Progress Notes (Signed)
Post procedural vital signs = Bp 135/88 Pulse 85 o2 97% Pain 7/10  Start time 350pm End time 358pm floro time 22s

## 2016-10-06 NOTE — Patient Instructions (Addendum)
You received an epidural steroid injection under fluoroscopic guidance. This is the most accurate way to perform an epidural injection. This injection was performed to relieve thigh or leg or foot pain that may be related to a pinched nerve in the lumbar spine. The local anesthetic injected today may cause numbness in your leg for a couple hours. If it is severe we may need to observe you for 30-60 minutes after the injection. The cortisone medicine injected today may take several days to take full effect. This medicine can also cause facial flushing or feeling of being warm.  This injection may last for days weeks or months. It can be repeated if needed. If it is not effective, another spinal level may need to be injected. Other treatments include medication management as well as physical therapy. In some cases surgery may be an option.  May resume Xarelto 10/07/16

## 2016-10-08 ENCOUNTER — Other Ambulatory Visit: Payer: Self-pay

## 2016-10-08 ENCOUNTER — Telehealth: Payer: Self-pay | Admitting: *Deleted

## 2016-10-08 NOTE — Telephone Encounter (Signed)
Pt sent message through Call Center to cancel office appt today.  Message to schedulers to r/s.

## 2016-10-09 ENCOUNTER — Encounter: Payer: Self-pay | Admitting: Hematology

## 2016-10-10 ENCOUNTER — Other Ambulatory Visit: Payer: Self-pay | Admitting: Hematology

## 2016-10-10 ENCOUNTER — Telehealth: Payer: Self-pay | Admitting: Hematology

## 2016-10-10 NOTE — Telephone Encounter (Signed)
Left voicemail about rescheduling my lab

## 2016-10-14 ENCOUNTER — Telehealth: Payer: Self-pay | Admitting: Hematology

## 2016-10-14 NOTE — Telephone Encounter (Signed)
Spoke with patient and rescheduled his appointment that he missed last week.

## 2016-10-21 ENCOUNTER — Ambulatory Visit (INDEPENDENT_AMBULATORY_CARE_PROVIDER_SITE_OTHER): Payer: BLUE CROSS/BLUE SHIELD | Admitting: Psychology

## 2016-10-21 DIAGNOSIS — F4312 Post-traumatic stress disorder, chronic: Secondary | ICD-10-CM | POA: Diagnosis not present

## 2016-10-22 ENCOUNTER — Other Ambulatory Visit (HOSPITAL_BASED_OUTPATIENT_CLINIC_OR_DEPARTMENT_OTHER): Payer: BLUE CROSS/BLUE SHIELD

## 2016-10-22 DIAGNOSIS — D751 Secondary polycythemia: Secondary | ICD-10-CM

## 2016-10-22 LAB — CBC WITH DIFFERENTIAL/PLATELET
BASO%: 0 % (ref 0.0–2.0)
Basophils Absolute: 0 10*3/uL (ref 0.0–0.1)
EOS ABS: 0 10*3/uL (ref 0.0–0.5)
EOS%: 0 % (ref 0.0–7.0)
HEMATOCRIT: 36.9 % — AB (ref 38.4–49.9)
HEMOGLOBIN: 11.2 g/dL — AB (ref 13.0–17.1)
LYMPH#: 0.9 10*3/uL (ref 0.9–3.3)
LYMPH%: 26.2 % (ref 14.0–49.0)
MCH: 23.7 pg — ABNORMAL LOW (ref 27.2–33.4)
MCHC: 30.4 g/dL — ABNORMAL LOW (ref 32.0–36.0)
MCV: 78.2 fL — ABNORMAL LOW (ref 79.3–98.0)
MONO#: 0.3 10*3/uL (ref 0.1–0.9)
MONO%: 8.7 % (ref 0.0–14.0)
NEUT%: 65.1 % (ref 39.0–75.0)
NEUTROS ABS: 2.2 10*3/uL (ref 1.5–6.5)
PLATELETS: 158 10*3/uL (ref 140–400)
RBC: 4.72 10*6/uL (ref 4.20–5.82)
RDW: 15.2 % — AB (ref 11.0–14.6)
WBC: 3.4 10*3/uL — AB (ref 4.0–10.3)

## 2016-10-23 ENCOUNTER — Ambulatory Visit (INDEPENDENT_AMBULATORY_CARE_PROVIDER_SITE_OTHER): Payer: BLUE CROSS/BLUE SHIELD | Admitting: Psychology

## 2016-10-23 DIAGNOSIS — F4310 Post-traumatic stress disorder, unspecified: Secondary | ICD-10-CM

## 2016-10-28 ENCOUNTER — Ambulatory Visit: Payer: Self-pay | Admitting: Psychology

## 2016-10-30 ENCOUNTER — Ambulatory Visit: Payer: BLUE CROSS/BLUE SHIELD | Admitting: Psychology

## 2016-11-04 ENCOUNTER — Ambulatory Visit: Payer: Self-pay | Admitting: Psychology

## 2016-11-06 ENCOUNTER — Ambulatory Visit: Payer: BLUE CROSS/BLUE SHIELD | Admitting: Psychology

## 2016-11-10 ENCOUNTER — Encounter: Payer: Self-pay | Admitting: Registered Nurse

## 2016-11-10 ENCOUNTER — Encounter: Payer: BLUE CROSS/BLUE SHIELD | Attending: Physical Medicine & Rehabilitation | Admitting: Registered Nurse

## 2016-11-10 VITALS — BP 104/73 | HR 99

## 2016-11-10 DIAGNOSIS — Z86718 Personal history of other venous thrombosis and embolism: Secondary | ICD-10-CM | POA: Diagnosis not present

## 2016-11-10 DIAGNOSIS — M25512 Pain in left shoulder: Secondary | ICD-10-CM | POA: Diagnosis not present

## 2016-11-10 DIAGNOSIS — D751 Secondary polycythemia: Secondary | ICD-10-CM | POA: Diagnosis not present

## 2016-11-10 DIAGNOSIS — I69398 Other sequelae of cerebral infarction: Secondary | ICD-10-CM | POA: Insufficient documentation

## 2016-11-10 DIAGNOSIS — Z87890 Personal history of sex reassignment: Secondary | ICD-10-CM | POA: Diagnosis not present

## 2016-11-10 DIAGNOSIS — I69320 Aphasia following cerebral infarction: Secondary | ICD-10-CM | POA: Diagnosis not present

## 2016-11-10 DIAGNOSIS — Z79891 Long term (current) use of opiate analgesic: Secondary | ICD-10-CM | POA: Diagnosis not present

## 2016-11-10 DIAGNOSIS — M542 Cervicalgia: Secondary | ICD-10-CM

## 2016-11-10 DIAGNOSIS — M13862 Other specified arthritis, left knee: Secondary | ICD-10-CM | POA: Diagnosis not present

## 2016-11-10 DIAGNOSIS — S43002A Unspecified subluxation of left shoulder joint, initial encounter: Secondary | ICD-10-CM | POA: Insufficient documentation

## 2016-11-10 DIAGNOSIS — L4050 Arthropathic psoriasis, unspecified: Secondary | ICD-10-CM | POA: Insufficient documentation

## 2016-11-10 DIAGNOSIS — Z96652 Presence of left artificial knee joint: Secondary | ICD-10-CM

## 2016-11-10 DIAGNOSIS — Z7901 Long term (current) use of anticoagulants: Secondary | ICD-10-CM | POA: Insufficient documentation

## 2016-11-10 DIAGNOSIS — K900 Celiac disease: Secondary | ICD-10-CM | POA: Diagnosis not present

## 2016-11-10 DIAGNOSIS — Z9181 History of falling: Secondary | ICD-10-CM | POA: Insufficient documentation

## 2016-11-10 DIAGNOSIS — R209 Unspecified disturbances of skin sensation: Secondary | ICD-10-CM | POA: Diagnosis not present

## 2016-11-10 DIAGNOSIS — I69351 Hemiplegia and hemiparesis following cerebral infarction affecting right dominant side: Secondary | ICD-10-CM | POA: Diagnosis not present

## 2016-11-10 DIAGNOSIS — Z79899 Other long term (current) drug therapy: Secondary | ICD-10-CM | POA: Diagnosis not present

## 2016-11-10 DIAGNOSIS — F418 Other specified anxiety disorders: Secondary | ICD-10-CM | POA: Insufficient documentation

## 2016-11-10 DIAGNOSIS — D6859 Other primary thrombophilia: Secondary | ICD-10-CM | POA: Insufficient documentation

## 2016-11-10 DIAGNOSIS — M7522 Bicipital tendinitis, left shoulder: Secondary | ICD-10-CM | POA: Insufficient documentation

## 2016-11-10 DIAGNOSIS — M545 Low back pain, unspecified: Secondary | ICD-10-CM

## 2016-11-10 DIAGNOSIS — M7712 Lateral epicondylitis, left elbow: Secondary | ICD-10-CM | POA: Diagnosis not present

## 2016-11-10 DIAGNOSIS — G894 Chronic pain syndrome: Secondary | ICD-10-CM

## 2016-11-10 DIAGNOSIS — G8929 Other chronic pain: Secondary | ICD-10-CM

## 2016-11-10 DIAGNOSIS — Z5181 Encounter for therapeutic drug level monitoring: Secondary | ICD-10-CM

## 2016-11-10 MED ORDER — OXYCODONE HCL 10 MG PO TABS: 10.0000 mg | ORAL_TABLET | Freq: Three times a day (TID) | ORAL | 0 refills | Status: DC | PRN

## 2016-11-10 MED ORDER — MORPHINE SULFATE ER 60 MG PO CP24: 60.0000 mg | ORAL_CAPSULE | Freq: Two times a day (BID) | ORAL | 0 refills | Status: DC

## 2016-11-10 NOTE — Progress Notes (Signed)
Subjective:    Patient ID: Jesse Bowers, male    DOB: 10-31-1965, 51 y.o.   MRN: [id number]  HPI: Jesse Bowers is a 51year old male who returns for follow-up appointment for chronic pain and medication refill. He states his pain is located in his neck, left elbow, left shoulder,lower back, bilateral knees L>R and bilateral feet. He rates his pain 8. His current exercise regime iswalking with his cane short distances in his home and performing stretching exercises.  Jesse Bowers states he's scheduled for Left Shoulder Surgery with Dr. Onnie Bowers on 11/14/2016. He also states he would like to speak with Dr. Naaman Bowers regarding his analgesics, he believes he has built up a tolerance and his pain is not being managed with his current prescription. He is scheduled to see Dr. Naaman Bowers to discuss the above he verbalizes understanding.   Last UDS was performed on 08/08/2016, it was consistent.   S/PLeft Total Knee Revision on 02/06/2016 by Dr. Wynelle Bowers  Pain Inventory Average Pain 9 Pain Right Now 8 My pain is constant, sharp, dull, stabbing, tingling and aching  In the last 24 hours, has pain interfered with the following? General activity 9 Relation with others 9 Enjoyment of life 9 What TIME of day is your pain at its worst? daytime and night Sleep (in general) Poor  Pain is worse with: bending, sitting and standing Pain improves with: heat/ice, medication and injections Relief from Meds: 7  Mobility walk with assistance use a walker ability to climb steps?  yes do you drive?  yes use a wheelchair transfers alone  Function disabled: date disabled 10/2012 I need assistance with the following:  feeding, dressing, bathing, meal prep, household duties and shopping Do you have any goals in this area?  no  Neuro/Psych bladder control problems weakness numbness tremor trouble walking spasms anxiety  Prior Studies Any changes since last visit?  no  Physicians involved in your  care Any changes since last visit?  no   Family History  Problem Relation Age of Onset  . Stroke Maternal Grandfather        99  . Heart attack Maternal Grandfather   . Hypertension Mother   . Psoriasis Mother   . Cancer Paternal Grandfather   . Heart attack Paternal Grandfather   . Stroke Paternal Uncle        age 45  . Stroke Maternal Grandmother   . Congestive Heart Failure Maternal Grandmother   . Heart attack Maternal Grandmother   . Protein C deficiency Sister 9       Miscarriages   Social History   Social History  . Marital status: Married    Spouse name: N/A  . Number of children: 2  . 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 Smoking status: Never Smoker  . Smokeless tobacco: Never Used  . Alcohol use Yes     Comment: wine once a month  . Drug use: No  . Sexual activity: Yes    Birth control/ protection: None     Comment: patient is a transgender on testosterone shots, no biological kids   Other Topics Concern  . None   Social History Narrative   Education 4 year college, former Therapist, sports X 15 years, pediatric nurse practitioner x 6 years, did NP degree from [place] [employer] of [place]. Relocated to [place] about 2 months ago  from [place], [place]. Patient was in [place] for last 4 years and prior to that in [place]. His wife is working as Scientist, research (physical sciences) for [employer]. Patient is not working and applying for disability. They have 2 kids but no biologic children.    Past Surgical History:  Procedure Laterality Date  . ABDOMINAL HYSTERECTOMY     TAH, BSO- tranverse incision.  [name] ANKLE ARTHROSCOPY WITH RECONSTRUCTION Right 2007  . CHOLECYSTECTOMY     laparoscopic  . HIP ARTHROSCOPY W/ LABRAL REPAIR Right 05/11/2013  . KNEE SURGERY Bilateral 1984   Right ACL, left PCL repair  . LIVER BIOPSY  2013   normal results.  [name] NASAL SEPTUM SURGERY N/A 09/20/2015  .  THYROIDECTOMY, PARTIAL  2008  . TOTAL KNEE REVISION Left 02/06/2016   Procedure: LEFT TOTAL KNEE REVISION;  Surgeon: Jesse Arabian, MD;  Location: [facility];  Service: Orthopedics;  Laterality: Left;   Past Medical History:  Diagnosis Date  . Abnormal weight loss   . Anxiety    PTSD per patient  . Celiac disease   . Clotting disorder ([place])    Dr. Anne Bowers  . Gender bias    prefers Male gender identity- "remains with vagina"  . Gluten enteropathy   . H/O parotitis    right   . H/O protein C deficiency   . Hx-TIA (transient ischemic attack)    2015  . Neck pain   . Neuromuscular disorder ([place])    bilateral neuropathy feet.  . Polycythemia, secondary   . PONV (postoperative nausea and vomiting)   . psoriatic arthritis   . Sleep apnea    cpap use- Dr. Felecia Bowers follows  . Stroke [facility])    Stroke left side -slight right sided weakness-Dr. Felecia Bowers follows  . Syrinx of spinal cord ([place]) 01/06/2014 on MRI   c spine  . Transfusion history    past history- none recent  . Transgendered    BP 104/73   Pulse 99   SpO2 97%   Opioid Risk Score:  0 Fall Risk Score:  `1  Depression screen PHQ 2/9  Depression screen [facility] 2/9 11/10/2016 09/15/2016 06/18/2015 05/24/2015  Decreased Interest 0 0 2 2  Down, Depressed, Hopeless 0 0 0 0  PHQ - 2 Score 0 0 2 2  Some recent data might be hidden      Review of Systems  Constitutional: Positive for unexpected weight change.  HENT: Negative.   Eyes: Negative.   Respiratory: Positive for shortness of breath.   Cardiovascular: Positive for leg swelling.  Gastrointestinal: Negative.   Endocrine: Negative.   Genitourinary:       Bladder control  Musculoskeletal: Positive for gait problem.       Spasms  Skin: Negative.   Allergic/Immunologic: Negative.   Neurological: Positive for tremors, weakness and numbness.       Tingling  Hematological: Negative.   Psychiatric/Behavioral: The patient is nervous/anxious.   All other systems reviewed and  are negative.      Objective:   Physical Exam  Constitutional: He is oriented to person, place, and time. He appears well-developed and well-nourished.  HENT:  Head: Normocephalic and atraumatic.  Neck: Normal range of motion. Neck supple.  Cardiovascular: Normal rate and regular rhythm.   Pulmonary/Chest: Effort normal and breath sounds normal.  Musculoskeletal:  Normal Muscle Bulk and Muscle Testing Reveals: Upper Extremities: Full ROM and Muscle Strength on the Right  5/5 and Left 4/5  Left AC Joint Tenderness Lumbar Paraspinal Tenderness:  L-4-L-5 Lower Extremities: Full ROM and Muscle Strength 5/5 Wearing Right AFO Arrived in wheelchair  Neurological: He is alert and oriented to person, place, and time.  Skin: Skin is warm and dry.  Psychiatric: He has a normal mood and affect.  Nursing note and vitals reviewed.         Assessment & Plan:  1. Psoriatic arthritis with pain in multiple areas, most prominently feet, hands, elbows. Refilled:Kadian 60 mg 24 hr. Capsule, one capsule every 12 hours #60 and Oxycodone 10 mg one tablet every 8 hours as needed #90. 11/10/2016 We will continue the opioid monitoring program, this consists of regular clinic visits, examinations, urine drug screen, pill counts as well as use of [place] Controlled Substance Reporting System.  Rheumatology Following. 09/15/2016 2. Chronic Pain Syndrome: Continue Compound Cream. 11/10/2016 3. Prior left sided CVA ('s) most substantial of which in May 2015 with residual right sided weakness, sensory loss, and expressive language deficits.: Continue to Monitor.11/10/2016 4. Patello-femoral arthritis left knee: Continue to monitor. 11/10/2016 S/P TKR on 07/03/14: Ortho Following: Dr. Wynelle Bowers perform Total Left Knee Revision on 02/06/2016. 11/10/2016 5. Chronic mid- low back pain: Continue current medication regime, and encourage to increase activity as tolerated.11/10/2016 6. Polycythemia: Oncology  Following. 11/10/2016 7. Depression with anxiety : Continue to Monitor. 11/10/2016 8. Muscle Spasm: Continue Flexeril. 11/10/2016 9. bilateral Ankle Pain: Continue to Monitor 11/10/2016.  10. Left lateral epicondylitis: Ortho Following. 11/10/2016 11. Left Shoulder Pain: Ortho Following. Scheduled for Surgery  11/10/2016 12. Lumbar Radiculitis: S/P ESI with Dr. Letta Pate. 10/06/2016, good relief noted.  20 minutes of face to face patient care time was spent during this visit. All questions were encouraged and answered.  F/U in 1 month

## 2016-11-11 ENCOUNTER — Ambulatory Visit: Payer: BLUE CROSS/BLUE SHIELD | Admitting: Psychology

## 2016-11-18 ENCOUNTER — Ambulatory Visit (INDEPENDENT_AMBULATORY_CARE_PROVIDER_SITE_OTHER): Payer: BLUE CROSS/BLUE SHIELD | Admitting: Psychology

## 2016-11-18 DIAGNOSIS — F4312 Post-traumatic stress disorder, chronic: Secondary | ICD-10-CM

## 2016-11-20 ENCOUNTER — Ambulatory Visit (INDEPENDENT_AMBULATORY_CARE_PROVIDER_SITE_OTHER): Payer: BLUE CROSS/BLUE SHIELD | Admitting: Psychology

## 2016-11-20 DIAGNOSIS — F4310 Post-traumatic stress disorder, unspecified: Secondary | ICD-10-CM

## 2016-11-25 ENCOUNTER — Ambulatory Visit: Payer: Self-pay | Admitting: Psychology

## 2016-11-25 ENCOUNTER — Telehealth: Payer: Self-pay | Admitting: Interventional Cardiology

## 2016-11-25 NOTE — Telephone Encounter (Signed)
Patient calling and states that he has been having bilateral lower extremity edema that is pitting for the past 6 months. Patient is in a wheelchair and states that he does become SOB with activity. Patient denies having any other symptoms. Patient states that he was seen by his provider this morning who recommended that he follow up with cardiology. Patient already has appointment scheduled for 9/27. Advised for patient to avoid salt in his diet and elevate legs to help with swelling. Patient verbalized understanding and thanked me for the call.

## 2016-11-25 NOTE — Telephone Encounter (Signed)
New message     Pt c/o swelling: STAT is pt has developed SOB within 24 hours  1. How long have you been experiencing swelling? Off and on since his knee surgery, but it is getting worse   2. Where is the swelling located? Legs , especially after activity   3.  Are you currently taking a "fluid pill"?  no   4.  Are you currently SOB? no  5.  Have you traveled recently? no

## 2016-11-26 ENCOUNTER — Encounter: Payer: Self-pay | Admitting: Interventional Cardiology

## 2016-11-26 NOTE — Progress Notes (Deleted)
Patient no show

## 2016-11-27 ENCOUNTER — Ambulatory Visit: Payer: BLUE CROSS/BLUE SHIELD | Admitting: Interventional Cardiology

## 2016-11-27 ENCOUNTER — Ambulatory Visit: Payer: BLUE CROSS/BLUE SHIELD | Admitting: Psychology

## 2016-11-28 ENCOUNTER — Ambulatory Visit: Payer: Self-pay | Admitting: Psychology

## 2016-11-28 DIAGNOSIS — G5622 Lesion of ulnar nerve, left upper limb: Secondary | ICD-10-CM | POA: Insufficient documentation

## 2016-11-28 DIAGNOSIS — G609 Hereditary and idiopathic neuropathy, unspecified: Secondary | ICD-10-CM | POA: Insufficient documentation

## 2016-12-02 ENCOUNTER — Ambulatory Visit (INDEPENDENT_AMBULATORY_CARE_PROVIDER_SITE_OTHER): Payer: BLUE CROSS/BLUE SHIELD | Admitting: Psychology

## 2016-12-02 DIAGNOSIS — F4312 Post-traumatic stress disorder, chronic: Secondary | ICD-10-CM | POA: Diagnosis not present

## 2016-12-03 ENCOUNTER — Encounter: Payer: Self-pay | Admitting: Physician Assistant

## 2016-12-03 ENCOUNTER — Ambulatory Visit (INDEPENDENT_AMBULATORY_CARE_PROVIDER_SITE_OTHER): Payer: BLUE CROSS/BLUE SHIELD | Admitting: Physician Assistant

## 2016-12-03 VITALS — BP 116/82 | HR 74 | Ht 69.0 in | Wt 242.0 lb

## 2016-12-03 DIAGNOSIS — R0609 Other forms of dyspnea: Secondary | ICD-10-CM | POA: Diagnosis not present

## 2016-12-03 DIAGNOSIS — R0600 Dyspnea, unspecified: Secondary | ICD-10-CM

## 2016-12-03 DIAGNOSIS — R600 Localized edema: Secondary | ICD-10-CM | POA: Diagnosis not present

## 2016-12-03 DIAGNOSIS — E8770 Fluid overload, unspecified: Secondary | ICD-10-CM

## 2016-12-03 DIAGNOSIS — Z79899 Other long term (current) drug therapy: Secondary | ICD-10-CM | POA: Diagnosis not present

## 2016-12-03 NOTE — Progress Notes (Signed)
Cardiology Office Note   Date:  12/03/2016   ID:  Jesse Bowers, DOB [birth year], MRN [id number]  PCP:  Concepcion Elk, MD  Cardiologist:  Dr. Gwynne Edinger, PA-C   Chief Complaint  Patient presents with  . Edema    History of Present Illness: Jesse Bowers is a 51 y.o. male with a history of CVA on Xarelto, panic attacks, protein C deficiency, neuropathy, celiac disease, OSA on C Pap  10/04/2015 office visit, palpitations with a heart rate greater than 160 associated with diaphoresis and flushing. Event monitor with sinus tachycardia up to 132, no critical arrhythmias noted 09/25 Phone notes regarding patient being in wheelchair, shortness of breath with activity, bilateral lower extremity edema  Jesse Bowers presents for cardiology evaluation and follow up.   He has had some strokes and has chronic ischemic small vessel disease and psoriatic arthritis. He also has foot problems and uses a type of brace. Because of all these Issues, he is in a motorized wheelchair. He has had bilateral shoulder surgeries over the summer. He gets short of breath doing the rehabilitation and the physical therapist stop sometimes because of this.  The LE edema has been going on about 6 months. It got worse when he went to the beach.   He was getting orthostatic dizziness on Prazosin. He was taking it for night terrors. He has tapered down to 1 mg, with improvement in the dizziness.   He wakes up with some fluid in his feet, but it gets much worse during the day. He watches the sodium in his diet. He has been doing that for 8 years. 2000 mg qd. He is not sure how much she drinks, he doesn't really track that. He states because of his medications, he is thirsty all the time. He thinks he drinks about 2 L a day of water plus soda and occasional other liquids.  He is compliant with CPAP (started about 2 yr ago) but still wakes during the night. He coughs, not sure if it is true PND or not. He  sleeps on 2 pillows, has been doing that for 5 years. He feels he could walk about 150 feet if flat ground and climate controlled.    Past Medical History:  Diagnosis Date  . Abnormal weight loss   . Anxiety    PTSD per patient  . Celiac disease   . Gender bias    prefers Male gender identity- "remains with vagina"  . Gluten enteropathy   . H/O parotitis    right   . H/O protein C deficiency    Dr. Anne Fu  . Hx-TIA (transient ischemic attack)    2015  . Neck pain   . Neuromuscular disorder ([place])    bilateral neuropathy feet.  . Polycythemia, secondary   . PONV (postoperative nausea and vomiting)   . psoriatic arthritis   . Sleep apnea    cpap use- Dr. [name] follows  . Stroke [facility])    Stroke left side -slight right sided weakness-Dr. [name] follows  . Syrinx of spinal cord ([place]) 01/06/2014   c spine on MRI  . Transfusion history    past history- none recent  . Transgendered     Past Surgical History:  Procedure Laterality Date  . ABDOMINAL HYSTERECTOMY     TAH, BSO- tranverse incision.  [name] ANKLE ARTHROSCOPY WITH RECONSTRUCTION Right 2007  . CHOLECYSTECTOMY     laparoscopic  . HIP ARTHROSCOPY W/ LABRAL REPAIR Right 05/11/2013  .  KNEE SURGERY Bilateral 1984   Right ACL, left PCL repair  . LIVER BIOPSY  2013   normal results.  [name] NASAL SEPTUM SURGERY N/A 09/20/2015  . THYROIDECTOMY, PARTIAL  2008  . TOTAL KNEE REVISION Left 02/06/2016   Procedure: LEFT TOTAL KNEE REVISION;  Surgeon: [name], MD;  Location: [facility];  Service: Orthopedics;  Laterality: Left;    Medication Sig  . Calcipotriene-Betameth Diprop (ENSTILAR) 0.005-0.064 % FOAM Apply 1 application topically 2 (two) times daily.   . clindamycin (CLEOCIN) 150 MG capsule Take 300 mg by mouth as needed (DENTAL APPT). Reported on 09/14/2015  . clorazepate (TRANXENE) 15 MG tablet Take 7.5-15 mg by mouth. Take 7.5 morning and afternoon, and then take 28ms at bedtime  . cyclobenzaprine (FLEXERIL) 10 MG  tablet Take 10 mg by mouth at bedtime.  .Marland Kitchendesonide (DESOWEN) 0.05 % ointment APPLY TO AFFECTED AREAS TWICE DAILY AS NEEDED FOR PSORIASIS.  .Marland Kitchenmorphine (KADIAN) 60 MG 24 hr capsule Take 1 capsule (60 mg total) by mouth every 12 (twelve) hours.  . NONFORMULARY OR COMPOUNDED ITEM Apply 1 application topically 3 (three) times daily. 8% ketamine, 5% amitriptyline, 5% baclofen, 5% gabapentin  60 GM  . ondansetron (ZOFRAN) 4 MG tablet Take 1 tablet (4 mg total) by mouth every 8 (eight) hours as needed for nausea or vomiting.  . Oxycodone HCl 10 MG TABS Take 1 tablet (10 mg total) by mouth every 8 (eight) hours as needed.  . prazosin (MINIPRESS) 1 MG capsule Take 1 mg by mouth at bedtime.  .Marland Kitchentestosterone cypionate (DEPO-TESTOSTERONE) 200 MG/ML injection Inject 0.25 mLs (50 mg total) into the muscle once a week. (Patient taking differently: Inject 100 mg into the muscle once a week. Friday)  . ustekinumab (STELARA) 90 MG/ML SOSY injection Inject 90 mg into the skin as directed. q 90 days  . venlafaxine XR (EFFEXOR-XR) 150 MG 24 hr capsule Take 300 mg by mouth daily with breakfast.  . XARELTO 20 MG TABS tablet TAKE 1 TABLET DAILY WITH SUPPER   No current facility-administered medications for this visit.     Allergies:   Penicillin g; Vancomycin; Wheat extract; Duloxetine; Gabapentin; Acetaminophen; Ibuprofen; Pregabalin; Nortriptyline; Sulfa antibiotics; and Sulfacetamide sodium-sulfur    Social History:  The patient  reports that he has never smoked. He has never used smokeless tobacco. He reports that he drinks alcohol. He reports that he does not use drugs.   Family History:  The patient's family history includes Cancer in his paternal grandfather; Congestive Heart Failure in his maternal grandmother; Heart attack in his maternal grandfather, maternal grandmother, and paternal grandfather; Hypertension in his mother; Protein C deficiency (age of onset: 322 in his sister; Psoriasis in his mother; Stroke  in his maternal grandfather, maternal grandmother, and paternal uncle.    ROS:  Please see the history of present illness. All other systems are reviewed and negative.    PHYSICAL EXAM: VS:  BP 116/82   Pulse 74   Ht 5' 9"  (1.753 m)   Wt 242 lb (109.8 kg)   BMI 35.74 kg/m  , BMI Body mass index is 35.74 kg/m. GEN: Well nourished, well developed, male in no acute distress  HEENT: normal for age  Neck: Minimal JVD, no carotid bruit, no masses Cardiac: RRR; soft murmur, no rubs, or gallops Respiratory: Decreased breath sounds bases but clear bilaterally, normal work of breathing GI: soft, nontender, nondistended, + BS MS: no deformity or atrophy; trace edema; distal pulses are 2+ in all  4 extremities   Skin: warm and dry, no rash Neuro:  Strength and sensation are intact Psych: euthymic mood, full affect   EKG:  EKG is ordered today. The ekg ordered today demonstrates sinus rhythm, heart rate 74, no ischemic changes, normal intervals  ECHO: 01/2013 in [place] EF 60-65 percent, mild MR/TR  MYOVIEW: 01/2013 in [place] EF 62%, no scar or ischemia  EVENT MONITOR: 10/2015  Normal sinus srhythm. Sinus tachycardia.  Occasional PVCs.  No pathologic arrhythmias.  Recent Labs: 07/09/2016: ALT 37; BUN 10.8; Creatinine 1.1; Potassium 4.2; Sodium 138 10/22/2016: HGB 11.2; Platelets 158    Wt Readings from Last 3 Encounters:  12/03/16 242 lb (109.8 kg)  02/06/16 239 lb (108.4 kg)  02/01/16 239 lb (108.4 kg)     Other studies Reviewed: Additional studies/ records that were reviewed today include: Office notes, hospital records and testing.  ASSESSMENT AND PLAN:  1.  DOE/Lower extremity edema: He has only minimal volume overload on exam. However, his dyspnea on exertion is significant. We'll add low-dose Lasix prn and he is to track his weight daily. Take the Lasix for weight gain or LE edema. Let us know if sx do not improve. It was explained that he can get edema during  the day, but if his weight is stable, that is from daytime intake. Limit fluids to 2 L daily. Ck BMET just before f/u appt. I explained that he likely has some deconditioning also, any activity is a plus.     F/u w/ Dr Irish Lack or myself in a month to reassess.    Current medicines are reviewed at length with the patient today.  The patient does not have concerns regarding medicines.  The following changes have been made:  Add Lasix  Labs/ tests ordered today include:   Orders Placed This Encounter  Procedures  . Basic metabolic panel  . EKG 12-Lead  . ECHOCARDIOGRAM COMPLETE     Disposition:   FU with Dr. Irish Lack  Signed, Rosaria Ferries, PA-C  12/03/2016 3:48 PM    [place] Phone: [phone]; Fax: [phone]  This note was written with the assistance of speech recognition software. Please excuse any transcriptional errors.

## 2016-12-03 NOTE — Patient Instructions (Signed)
Medication Instructions:  CHANGE LASIX 20MG AS NEEDED FOR LOWER EXTREMITY SWELLING If you need a refill on your cardiac medications before your next appointment, please call your pharmacy.  Labwork: BMET THE DAY BEFORE OR DAY OF NEXT APPOINTMENT HERE IN OUR OFFICE AT [facility]  Testing/Procedures: Your physician has requested that you have an echocardiogram. Echocardiography is a painless test that uses sound waves to create images of your heart. It provides your doctor with information about the size and shape of your heart and how well your heart's chambers and valves are working. This procedure takes approximately one hour. There are no restrictions for this procedure.    Follow-Up: Your physician wants you to follow-up in: 3-4 [place]. AFTER ECHO PROCEDURE  Special Instructions: TAKE LASIX 20MG AS NEEDED FOR SWELLING OR WEIGHT GAIN  is up 3 lbs in 1 day or 5 lbs in 1 week.  FLUID RESTRICTION OF 1.5 LITERS (ABOUT 48 OZ)  CONTINUE WITH LOW SODIUM DIET  IF YOU GET CRAMPING WATCH HIGH SODIUM FOODS LIKE: CANTALOUPE AND APRICOTS MAY CHECK FOR MORE FOODS ONLINE TO SEE WHAT YOU ARE EATING  OK FOR ICE MAKE SURE TO INCLUDE IN FLUID RESTRICTION  OK FOR PT-TRY TO GAGE YOURSELF < 7/10 SCALE  Thank you for choosing [facility] at [employer]!!

## 2016-12-04 ENCOUNTER — Telehealth: Payer: Self-pay | Admitting: Physician Assistant

## 2016-12-04 ENCOUNTER — Encounter: Payer: Self-pay | Admitting: Physician Assistant

## 2016-12-04 ENCOUNTER — Other Ambulatory Visit: Payer: Self-pay | Admitting: Physician Assistant

## 2016-12-04 ENCOUNTER — Ambulatory Visit (INDEPENDENT_AMBULATORY_CARE_PROVIDER_SITE_OTHER): Payer: BLUE CROSS/BLUE SHIELD | Admitting: Psychology

## 2016-12-04 DIAGNOSIS — F4310 Post-traumatic stress disorder, unspecified: Secondary | ICD-10-CM | POA: Diagnosis not present

## 2016-12-04 MED ORDER — FUROSEMIDE 20 MG PO TABS: 20.0000 mg | ORAL_TABLET | Freq: Every day | ORAL | 11 refills | Status: DC | PRN

## 2016-12-04 MED ORDER — POTASSIUM CHLORIDE ER 10 MEQ PO TBCR: 10.0000 meq | EXTENDED_RELEASE_TABLET | Freq: Every day | ORAL | 11 refills | Status: DC | PRN

## 2016-12-04 NOTE — Telephone Encounter (Signed)
New Message     Is patient approved to do Physical Therapy ? They would like to go over his therapy and get approval

## 2016-12-04 NOTE — Telephone Encounter (Signed)
Returned call to [place] (PT) @ [place] to inquire what therapy is needed - LMTCB

## 2016-12-04 NOTE — Telephone Encounter (Signed)
Spoke with Jesse Bowers, PT He had a shoulder scope  He did ROM this week She said normal progression would be arm bike She states he does not have SOB Needs to be cleared for ROM and general strengthening  Fax or verbal OK Fax: [phone]

## 2016-12-04 NOTE — Telephone Encounter (Signed)
LMTCB - advised can fax over PT therapy plan for review or can call back

## 2016-12-04 NOTE — Telephone Encounter (Signed)
Follow up      Returning a call to the nurse.  OK to leave a message on her voicemail regarding if it is ok for pt to have PT on shoulder

## 2016-12-05 NOTE — Telephone Encounter (Signed)
Ok to do ROM and anything else the physical therapist wants.

## 2016-12-05 NOTE — Telephone Encounter (Signed)
LM for Jesse Bowers, PT with recommendations and routed via [facility] fax

## 2016-12-06 ENCOUNTER — Encounter: Payer: Self-pay | Admitting: Hematology

## 2016-12-08 ENCOUNTER — Encounter: Payer: Self-pay | Admitting: Hematology

## 2016-12-09 ENCOUNTER — Ambulatory Visit: Payer: BLUE CROSS/BLUE SHIELD | Admitting: Psychology

## 2016-12-10 ENCOUNTER — Encounter
Payer: BLUE CROSS/BLUE SHIELD | Attending: Physical Medicine & Rehabilitation | Admitting: Physical Medicine & Rehabilitation

## 2016-12-10 ENCOUNTER — Encounter: Payer: Self-pay | Admitting: Physical Medicine & Rehabilitation

## 2016-12-10 VITALS — BP 123/81 | HR 106

## 2016-12-10 DIAGNOSIS — G950 Syringomyelia and syringobulbia: Secondary | ICD-10-CM

## 2016-12-10 DIAGNOSIS — K900 Celiac disease: Secondary | ICD-10-CM | POA: Insufficient documentation

## 2016-12-10 DIAGNOSIS — M13862 Other specified arthritis, left knee: Secondary | ICD-10-CM | POA: Insufficient documentation

## 2016-12-10 DIAGNOSIS — I69320 Aphasia following cerebral infarction: Secondary | ICD-10-CM | POA: Diagnosis not present

## 2016-12-10 DIAGNOSIS — R209 Unspecified disturbances of skin sensation: Secondary | ICD-10-CM | POA: Diagnosis not present

## 2016-12-10 DIAGNOSIS — Z79891 Long term (current) use of opiate analgesic: Secondary | ICD-10-CM | POA: Insufficient documentation

## 2016-12-10 DIAGNOSIS — I69351 Hemiplegia and hemiparesis following cerebral infarction affecting right dominant side: Secondary | ICD-10-CM | POA: Insufficient documentation

## 2016-12-10 DIAGNOSIS — M7522 Bicipital tendinitis, left shoulder: Secondary | ICD-10-CM | POA: Diagnosis not present

## 2016-12-10 DIAGNOSIS — D6859 Other primary thrombophilia: Secondary | ICD-10-CM | POA: Insufficient documentation

## 2016-12-10 DIAGNOSIS — M792 Neuralgia and neuritis, unspecified: Secondary | ICD-10-CM | POA: Diagnosis not present

## 2016-12-10 DIAGNOSIS — F418 Other specified anxiety disorders: Secondary | ICD-10-CM | POA: Diagnosis not present

## 2016-12-10 DIAGNOSIS — L4050 Arthropathic psoriasis, unspecified: Secondary | ICD-10-CM | POA: Insufficient documentation

## 2016-12-10 DIAGNOSIS — G8929 Other chronic pain: Secondary | ICD-10-CM | POA: Insufficient documentation

## 2016-12-10 DIAGNOSIS — M545 Low back pain: Secondary | ICD-10-CM | POA: Insufficient documentation

## 2016-12-10 DIAGNOSIS — Z96659 Presence of unspecified artificial knee joint: Secondary | ICD-10-CM | POA: Diagnosis not present

## 2016-12-10 DIAGNOSIS — M75101 Unspecified rotator cuff tear or rupture of right shoulder, not specified as traumatic: Secondary | ICD-10-CM | POA: Diagnosis not present

## 2016-12-10 DIAGNOSIS — D751 Secondary polycythemia: Secondary | ICD-10-CM | POA: Diagnosis not present

## 2016-12-10 DIAGNOSIS — Z79899 Other long term (current) drug therapy: Secondary | ICD-10-CM | POA: Diagnosis not present

## 2016-12-10 DIAGNOSIS — Z86718 Personal history of other venous thrombosis and embolism: Secondary | ICD-10-CM | POA: Insufficient documentation

## 2016-12-10 DIAGNOSIS — Z9181 History of falling: Secondary | ICD-10-CM | POA: Diagnosis not present

## 2016-12-10 DIAGNOSIS — Z87890 Personal history of sex reassignment: Secondary | ICD-10-CM | POA: Insufficient documentation

## 2016-12-10 DIAGNOSIS — Z7901 Long term (current) use of anticoagulants: Secondary | ICD-10-CM | POA: Diagnosis not present

## 2016-12-10 DIAGNOSIS — I69398 Other sequelae of cerebral infarction: Secondary | ICD-10-CM | POA: Insufficient documentation

## 2016-12-10 DIAGNOSIS — S43002A Unspecified subluxation of left shoulder joint, initial encounter: Secondary | ICD-10-CM | POA: Diagnosis not present

## 2016-12-10 DIAGNOSIS — T84018S Broken internal joint prosthesis, other site, sequela: Secondary | ICD-10-CM

## 2016-12-10 MED ORDER — OXYCODONE HCL 10 MG PO TABS: 10.0000 mg | ORAL_TABLET | Freq: Three times a day (TID) | ORAL | 0 refills | Status: DC | PRN

## 2016-12-10 MED ORDER — BACLOFEN 10 MG PO TABS: 10.0000 mg | ORAL_TABLET | Freq: Three times a day (TID) | ORAL | 3 refills | Status: DC

## 2016-12-10 MED ORDER — MORPHINE SULFATE ER 60 MG PO CP24: 60.0000 mg | ORAL_CAPSULE | Freq: Two times a day (BID) | ORAL | 0 refills | Status: DC

## 2016-12-10 MED ORDER — NORTRIPTYLINE HCL 10 MG PO CAPS: 10.0000 mg | ORAL_CAPSULE | Freq: Every day | ORAL | 2 refills | Status: DC

## 2016-12-10 NOTE — Progress Notes (Signed)
Subjective:    Patient ID: Jesse Bowers, male    DOB: 10-17-1965, 51 y.o.   MRN: [id number]  HPI   [name] is here in follow up of his chronic pain. He had right shoulder arthroscopy in June and left shoulder surgery/exision and debridement in September. The left shoulder is still sensitive.   He is having more pain in his feet related to his arthritis. He reports his feet cramp and his toes curl under when he bears weight or even when he does. He received new orthotics from ortho although they are not affecting his toe curling or foot pain.   He felt that the lumbar ESI helped this summer but it only lasted a few weeks.   Ahron remains on kadian and oxycodone for pain. He doesn't feel that they are working as well as they once did.   Pain Inventory Average Pain 9 Pain Right Now 8 My pain is sharp, stabbing and aching  In the last 24 hours, has pain interfered with the following? General activity 9 Relation with others 9 Enjoyment of life 9 What TIME of day is your pain at its worst? evening Sleep (in general) Poor  Pain is worse with: bending, sitting and standing Pain improves with: medication, TENS and injections Relief from Meds: 2  Mobility walk with assistance use a walker ability to climb steps?  yes do you drive?  yes use a wheelchair needs help with transfers  Function disabled: date disabled 2018 I need assistance with the following:  feeding, dressing, bathing, meal prep, household duties and shopping  Neuro/Psych weakness tremor trouble walking spasms anxiety  Prior Studies Any changes since last visit?  no  Physicians involved in your care Any changes since last visit?  no   Family History  Problem Relation Age of Onset  . Stroke Maternal Grandfather        49  . Heart attack Maternal Grandfather   . Hypertension Mother   . Psoriasis Mother   . Cancer Paternal Grandfather   . Heart attack Paternal Grandfather   . Stroke Paternal Uncle       age 88  . Stroke Maternal Grandmother   . Congestive Heart Failure Maternal Grandmother   . Heart attack Maternal Grandmother   . Protein C deficiency Sister 65       Miscarriages   Social History   Social History  . Marital status: Married    Spouse name: N/A  . Number of children: 2  . 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 Smoking status: Never Smoker  . Smokeless tobacco: Never Used  . Alcohol use Yes     Comment: wine once a month  . Drug use: No  . Sexual activity: Yes    Birth control/ protection: None     Comment: patient is a transgender on testosterone shots, no biological kids   Other Topics Concern  . Not on file   Social History Narrative   Education 4 year college, former Therapist, sports X 15 years, pediatric nurse practitioner x 6 years, did NP degree from [place] of [place]. Relocated to [place] about 2 months ago from [place], [place]. Patient was in [place] for last 4 years and prior to that in [place]. His wife is working as Scientist, research (physical sciences) for [employer]. Patient is not working and applying for disability. They have  2 kids but no biologic children.    Past Surgical History:  Procedure Laterality Date  . ABDOMINAL HYSTERECTOMY     TAH, BSO- tranverse incision.  [name] ANKLE ARTHROSCOPY WITH RECONSTRUCTION Right 2007  . CHOLECYSTECTOMY     laparoscopic  . HIP ARTHROSCOPY W/ LABRAL REPAIR Right 05/11/2013  . KNEE SURGERY Bilateral 1984   Right ACL, left PCL repair  . LIVER BIOPSY  2013   normal results.  [name] NASAL SEPTUM SURGERY N/A 09/20/2015  . THYROIDECTOMY, PARTIAL  2008  . TOTAL KNEE REVISION Left 02/06/2016   Procedure: LEFT TOTAL KNEE REVISION;  Surgeon: [name], MD;  Location: [facility];  Service: Orthopedics;  Laterality: Left;   Past Medical History:  Diagnosis Date  . Abnormal weight loss   . Anxiety    PTSD per patient  . Celiac disease     . Gender bias    prefers Male gender identity- "remains with vagina"  . Gluten enteropathy   . H/O parotitis    right   . H/O protein C deficiency    Dr. Anne Fu  . Hx-TIA (transient ischemic attack)    2015  . Neck pain   . Neuromuscular disorder ([place])    bilateral neuropathy feet.  . Polycythemia, secondary   . PONV (postoperative nausea and vomiting)   . psoriatic arthritis   . Sleep apnea    cpap use- Dr. [name] follows  . Stroke [facility])    Stroke left side -slight right sided weakness-Dr. [name] follows  . Syrinx of spinal cord ([place]) 01/06/2014   c spine on MRI  . Transfusion history    past history- none recent  . Transgendered    There were no vitals taken for this visit.  Opioid Risk Score:   Fall Risk Score:  `1  Depression screen PHQ 2/9  Depression screen [facility] 2/9 11/10/2016 09/15/2016 06/18/2015 05/24/2015  Decreased Interest 0 0 2 2  Down, Depressed, Hopeless 0 0 0 0  PHQ - 2 Score 0 0 2 2  Some recent data might be hidden     Review of Systems  Constitutional: Negative.   HENT: Negative.   Eyes: Negative.   Respiratory: Negative.   Cardiovascular: Negative.   Gastrointestinal: Negative.   Endocrine: Negative.   Genitourinary: Negative.   Musculoskeletal: Negative.   Skin: Negative.   Allergic/Immunologic: Negative.   Neurological: Negative.   Hematological: Negative.   Psychiatric/Behavioral: Negative.   All other systems reviewed and are negative.      Objective:   Physical Exam  General: NAD  HEENT:PERRL,  Neck:Supple without JVD or lymphadenopathy  Heart:RRR Chest: CTA Bilaterally without wheezes or rales. Normal effort   Abdomen:soft  Extremities:No clubbing, cyanosis, or edema. Pulses are 2+  Skin:Clean and intact without signs of breakdown. Left TKA scar.  Neuro:Pt is pleasant and alert and oriented x 3. He stutters and has word finding deficits. He is able to converse intelligently and has good insight and awareness. His  short term memory is functional on a conversational level. Minimal right facial weakness and sensory loss is appreciated. UES: 4+ to 5/5 LUE with some give away at the shoulder. RUE: 4- to 4 deltoid, bicep, tricep, wrist and grip.  RLE: 3+ hf,  ke and ankle df. LE: 3+/5 prox to /5 distal . DTR's remain hyperactive in the lower extremities in general. No resting tone.   Sensory loss 1+/2 Right face, hand, and leg is unchanged.  Musculoskeletal:shoulders with improved ROM. His left foot  with chronic PA changes. Mild varus deformity.  Psych:Pt's affect is appropriate. Pt is cooperative. Is distracted at times   Assessment & Plan:  1. Psoriatic arthritis with pain in multiple areas, most prominently feet, hands, elbows. A lot of his pedal pain is also related to his prior CVA and associated motor/sensory changes/spasticity.  2. 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  3. Hx of left total knee revision 02/06/16. 4. 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 Clinically pt presenting with Left S1 radiculitis as well 5. Protein C deficiency  6. Left shoulder subluxation, bicipital tendonitis  7. Central sleep apnea  8. Polycythemia  9. Depression with anxiety.  10. Left lateral epidondylitis   Plan:  1. Rheumatological treatment per rheum MD  2. Continue HEP, safety awareness which we discussed today.    3. Continue voltaren gel to hands, feet, knees, elbows if ok with H/O .    4. Begin trial of baclofen for lower extermity spasms/back pain.    5. Refilled  kadian 13m q12 #60 and oxycodone 160m#90 today---consider switching his long acting agent but we need to explore other options first 6. Trial of pamelor 10-2050mhs for sleep and neuropathic pain   15 minutes of face to face patient care time were spent during this visit. All questions were encouraged and answered.   Follow up in a month.

## 2016-12-10 NOTE — Patient Instructions (Signed)
PLEASE FEEL FREE TO CALL OUR OFFICE WITH ANY PROBLEMS OR QUESTIONS ([phone])      

## 2016-12-11 ENCOUNTER — Ambulatory Visit: Payer: BLUE CROSS/BLUE SHIELD | Admitting: Psychology

## 2016-12-15 ENCOUNTER — Other Ambulatory Visit: Payer: Self-pay

## 2016-12-15 ENCOUNTER — Telehealth: Payer: Self-pay

## 2016-12-15 ENCOUNTER — Ambulatory Visit (HOSPITAL_COMMUNITY): Payer: BLUE CROSS/BLUE SHIELD | Attending: Cardiovascular Disease

## 2016-12-15 DIAGNOSIS — I340 Nonrheumatic mitral (valve) insufficiency: Secondary | ICD-10-CM | POA: Insufficient documentation

## 2016-12-15 DIAGNOSIS — G4730 Sleep apnea, unspecified: Secondary | ICD-10-CM | POA: Insufficient documentation

## 2016-12-15 DIAGNOSIS — I517 Cardiomegaly: Secondary | ICD-10-CM

## 2016-12-15 DIAGNOSIS — R0609 Other forms of dyspnea: Secondary | ICD-10-CM | POA: Insufficient documentation

## 2016-12-15 DIAGNOSIS — R600 Localized edema: Secondary | ICD-10-CM

## 2016-12-15 DIAGNOSIS — Z79899 Other long term (current) drug therapy: Secondary | ICD-10-CM | POA: Diagnosis not present

## 2016-12-15 DIAGNOSIS — Z8673 Personal history of transient ischemic attack (TIA), and cerebral infarction without residual deficits: Secondary | ICD-10-CM | POA: Insufficient documentation

## 2016-12-15 DIAGNOSIS — E8770 Fluid overload, unspecified: Secondary | ICD-10-CM | POA: Insufficient documentation

## 2016-12-15 DIAGNOSIS — R0600 Dyspnea, unspecified: Secondary | ICD-10-CM

## 2016-12-15 HISTORY — DX: Cardiomegaly: I51.7

## 2016-12-15 NOTE — Telephone Encounter (Signed)
Patient has came to the window stating he couldn't start his medicine due to being without power.  FYI wanted provider to know he would start meds 10/15/20108

## 2016-12-16 ENCOUNTER — Ambulatory Visit: Payer: Self-pay | Admitting: Psychology

## 2016-12-17 ENCOUNTER — Encounter: Payer: Self-pay | Admitting: Physician Assistant

## 2016-12-18 ENCOUNTER — Ambulatory Visit (INDEPENDENT_AMBULATORY_CARE_PROVIDER_SITE_OTHER): Payer: BLUE CROSS/BLUE SHIELD | Admitting: Psychology

## 2016-12-18 DIAGNOSIS — F4310 Post-traumatic stress disorder, unspecified: Secondary | ICD-10-CM | POA: Diagnosis not present

## 2016-12-19 ENCOUNTER — Telehealth (HOSPITAL_COMMUNITY): Payer: Self-pay | Admitting: Radiology

## 2016-12-19 NOTE — Telephone Encounter (Signed)
Spoke with patient to address concerns during his echocardiogram appointment. Spoke with sonographer and appropriate follow up was completed.

## 2016-12-23 ENCOUNTER — Ambulatory Visit (INDEPENDENT_AMBULATORY_CARE_PROVIDER_SITE_OTHER): Payer: BLUE CROSS/BLUE SHIELD | Admitting: Psychology

## 2016-12-23 DIAGNOSIS — F4312 Post-traumatic stress disorder, chronic: Secondary | ICD-10-CM

## 2016-12-24 ENCOUNTER — Telehealth: Payer: Self-pay | Admitting: Physician Assistant

## 2016-12-24 ENCOUNTER — Ambulatory Visit (INDEPENDENT_AMBULATORY_CARE_PROVIDER_SITE_OTHER): Payer: BLUE CROSS/BLUE SHIELD | Admitting: Psychology

## 2016-12-24 DIAGNOSIS — G319 Degenerative disease of nervous system, unspecified: Secondary | ICD-10-CM

## 2016-12-24 DIAGNOSIS — F900 Attention-deficit hyperactivity disorder, predominantly inattentive type: Secondary | ICD-10-CM

## 2016-12-24 DIAGNOSIS — F4310 Post-traumatic stress disorder, unspecified: Secondary | ICD-10-CM | POA: Diagnosis not present

## 2016-12-24 DIAGNOSIS — F341 Dysthymic disorder: Secondary | ICD-10-CM

## 2016-12-24 NOTE — Telephone Encounter (Signed)
New Message    Pt is returning Ovid Curd call for Echo , leave message on voicemail

## 2016-12-24 NOTE — Telephone Encounter (Signed)
done

## 2016-12-25 ENCOUNTER — Ambulatory Visit (INDEPENDENT_AMBULATORY_CARE_PROVIDER_SITE_OTHER): Payer: BLUE CROSS/BLUE SHIELD | Admitting: Psychology

## 2016-12-25 DIAGNOSIS — F4310 Post-traumatic stress disorder, unspecified: Secondary | ICD-10-CM | POA: Diagnosis not present

## 2016-12-30 ENCOUNTER — Ambulatory Visit (INDEPENDENT_AMBULATORY_CARE_PROVIDER_SITE_OTHER): Payer: BLUE CROSS/BLUE SHIELD | Admitting: Psychology

## 2016-12-30 ENCOUNTER — Ambulatory Visit: Payer: BLUE CROSS/BLUE SHIELD | Admitting: Interventional Cardiology

## 2016-12-30 DIAGNOSIS — F4312 Post-traumatic stress disorder, chronic: Secondary | ICD-10-CM

## 2017-01-01 ENCOUNTER — Ambulatory Visit (INDEPENDENT_AMBULATORY_CARE_PROVIDER_SITE_OTHER): Payer: BLUE CROSS/BLUE SHIELD | Admitting: Psychology

## 2017-01-01 DIAGNOSIS — F4310 Post-traumatic stress disorder, unspecified: Secondary | ICD-10-CM

## 2017-01-05 ENCOUNTER — Telehealth: Payer: Self-pay | Admitting: Hematology

## 2017-01-05 NOTE — Telephone Encounter (Signed)
Spoke with patient today confirmed 11/7 @ 12:30pm lab/fu.

## 2017-01-05 NOTE — Progress Notes (Signed)
[facility]AT[facility] FOLLOW UP NOTE DATE OF VISIT: 01/07/2017   Patient Care Team: Concepcion Elk, MD as PCP - General (Internal Medicine) Bernadene Bell, MD (Inactive) as Consulting Physician (Hematology) Rankins, Bill Salinas, MD as Referring Physician (Family Medicine)  CHIEF COMPLAINTS Follow up DVT and polycythemia  HISTORY OF INITIAL PRESENTING ILLNESS (from Dr. Renella Cunas note):   Jesse Bowers 51 y.o. 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 Patient is a former Software engineer. Patient had a fractured foot repair in 2007 and caused a right leg DVT. At the time he took Coumadin for 3 months.   In May of this year 2015 patient presented with right-sided weakness and slurring of speech, he felt tired, had some nausea and was having trouble gripping the soap when he was in the shower. He checked himself in the mirror there was no facial droop but based on the symptoms he texted his wife took him to the hospital where he was kept overnight. His symptoms of TIA lasted for more than 24 hours and he had appropriate neuro imaging done. When he was seen by a neurologist later, he was told that he had a left frontal stroke. This happened at a young age of 89 so his primary care physician ordered hypercoagulable panel and that showed a protein C deficiency. This TIA or stroke event took place on 07/27/2013 an MRI brain from that date showed changes of early chronic small vessel ischemic changes. A note from the previous hematologist Dr Laury Axon in [facility] indicated that at [facility] they also felt that it was not a stroke. Carotid ultrasound showed minimal atherosclerotic plaques. The hypercoagulable panel was drawn on 08/19/2013 and he was negative for factor V Leiden mutation, prothrombin gene mutation. He had normal levels of antithrombin III and protein S but the protein C level was decreased at 47%(reference  range is 70-180%). A repeat protein C activity done on 09/28/2013 was 58% still low. Lupus anticoagulant was negative. Beta-2 glycoprotein antibodies were negative.  Patient is currently receiving a baby aspirin for last 3 months. The hematologist in [place] Dr. Alain Marion also gave him some Lovenox injections for prophylactic use just before a long distance travel. He encouraged him to avoid inactivity/immobility. Patient was not given an anticoagulant such as warfarin or the new generation drugs like Xarelto or Eliquis.  On 04/28/2013 patient's CBC showed a white count of 6.2 hemoglobin 18.7 hematocrit 53.1 MCV 92 platelet count of 207. Differential was normal. Chemistry panel was normal creatinine 1.23. Patient does have history of elevated liver enzymes in the past and the etiology of that is not clear likely medication related. He had liver biopsy done in 2013 which was reportedly normal. Patient was taking Remicade for over an year and it was helping his skin lesions of psoriasis but not the joint pain. It was switched to Enbrel which he is still taking a subcutaneous shot once a week that is every Sunday.  Patient also have some pain control issues and was seeing a pain specialist in [place]. He is in the process of getting a referral to a pain specialist here. His pain medications include morphine extended release, oxycodone, occasional naproxen and methadone. Patient is also on antidepressants.  Patient also had a clot at PICC line for antibiotics in 2006. Patient had history of psoriasis and psoriatic arthritis being managed by a rheumatologist in [place]. Now he is shifting his care to a local rheumatologist  here. Most of his psoriasis involves the bottom of the feet mostly on the left side, hands, knees and ears.  Patient also had a history of right-sided parotitis documented in May 2015 that was treated with antibiotics. He does have chronic dry mouth because of sleep apnea as well as his  antidepressant therapy.  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 That itself can increase the risk for both arterial as well as venous thromboembolic events. 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 For his depression patient was on Prozac in March 2015 but it was causing decreased sexual libido and changed to Wellbutrin in March 2015. He does have mild constipation with the use of narcotics and takes Colace twice a day.  Patient is also complaining of 50 pound weight loss in the last 3-4 months. He thinks it is unintentional. He has been on immunosuppressant medication such as Remicade and Enbrel and sometimes those can be associated with possibility of non-Hodgkin lymphomas. He also have chronic pain and recently saw an orthopedic physician who recommended MRI of the neck which will be scheduled. He is also seeing a rheumatologist next month, a neurologist and a pain specialist. He does complain of left shoulder pain and left elbow pain. There was some discussion by previous rheumatologist in [place] about switching his biological therapy boost to Stelara or [place] Nail once he is evaluated by a rheumatologist. Patient had already received his flu shot as well as pneumonia shot. He also complains of having significant short-term memory problems and trouble concentrating which could be from his medications or TIA episode.  So patient's active hematologic problems are  #1 Protein C deficiency documented twice. #2 TIA/CVA in May 2015 #3 history of DVT in 2007 Right leg treated with Coumadin x 3 months. It was provoked by foot injury #3 Use of immunosuppressant medications which increase the likelihood of lymphomas. 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  CURRENT THERAPY: Xarelto 20  mg once daily, phlebotomy if hematocrit above 45%   INTERVAL HISTORY:  Mr. Ryle returns for follow-up. He was last seen by me 6 months ago. He presents to the clinic today reporting his left  knee is loose again and he is in a left knee splint and right lower leg splint. He reports to having a borderline enlarged spleen. A MRCP will be done to look at bile ducts. He has tenderness on his left abdomen. He gets right sided tenderness when he eats. He notes his blood level has been high before, he would donate blood. He wonders if he travels will he need any additional medication other then Xarelto. He has several scan coming up in the next 3-4 months.  He is getting testosterone injection and is almost done with his current vial. He had flu shot with his PCP.     MEDICAL HISTORY:   Past Medical History:  Diagnosis Date  . Abnormal weight loss   . Anxiety    PTSD per patient  . Celiac disease   . Gender bias    prefers Male gender identity- "remains with vagina"  . Gluten enteropathy   . H/O parotitis    right   . H/O protein C deficiency    Dr. Anne Fu  . Hx-TIA (transient ischemic attack)    2015  . Neck pain   .  Neuromuscular disorder ([place])    bilateral neuropathy feet.  . Polycythemia, secondary   . PONV (postoperative nausea and vomiting)   . psoriatic arthritis   . Sleep apnea    cpap use- Dr. [name] follows  . Stroke [facility])    Stroke left side -slight right sided weakness-Dr. [name] follows  . 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 Transfusion history    past history- none recent  . Transgendered     SURGICAL HISTORY:  Past Surgical History:  Procedure Laterality Date  . ABDOMINAL HYSTERECTOMY     TAH, BSO- tranverse incision.  [name] ANKLE ARTHROSCOPY WITH RECONSTRUCTION Right 2007  . CHOLECYSTECTOMY     laparoscopic  . HIP ARTHROSCOPY W/ LABRAL REPAIR Right 05/11/2013  . KNEE SURGERY Bilateral 1984   Right ACL, left PCL repair  . LIVER  BIOPSY  2013   normal results.  [name] NASAL SEPTUM SURGERY N/A 09/20/2015  . THYROIDECTOMY, PARTIAL  2008    SOCIAL HISTORY:  Social History   Socioeconomic History  . Marital status: Married    Spouse name: Not on file  . Number of children: 2  . Years of education: 4y college  . Highest education level: Not on file  Social Needs  . Financial resource strain: Not on file  . Food insecurity - worry: Not on file  . Food insecurity - inability: Not on file  . Transportation needs - medical: Not on file  . Transportation needs - non-medical: Not on file  Occupational History  . Occupation: Pediatric Nurse practitioner    Comment: Not working since [place] 2015  Tobacco Use  . Smoking status: Never Smoker  . Smokeless tobacco: Never Used  Substance and Sexual Activity  . Alcohol use: Yes    Comment: wine once a month  . Drug use: No  . Sexual activity: Yes    Birth control/protection: None    Comment: patient is a transgender on testosterone shots, no biological kids  Other Topics Concern  . Not on file  Social History Narrative   Education 4 year college, former Therapist, sports X 15 years, pediatric nurse practitioner x 6 years, did NP degree from [employer] [place]. Relocated to [place] about 2 months ago from [place], [place]. Patient was in [place] for last 4 years and prior to that in [place]. His wife is working as Scientist, research (physical sciences) for [employer]. Patient is not working and applying for disability. They have 2 kids but no biologic children.     FAMILY HISTORY:  Family History  Problem Relation Age of Onset  . Stroke Maternal Grandfather        21  . Heart attack Maternal Grandfather   . Hypertension Mother   . Psoriasis Mother   . Cancer Paternal Grandfather   . Heart attack Paternal Grandfather   . Stroke Paternal Uncle        age 58  . Stroke Maternal Grandmother   . Congestive Heart Failure Maternal Grandmother   . Heart attack Maternal Grandmother   . Protein C deficiency  Sister 27       Miscarriages    ALLERGIES:  is allergic to penicillin g; vancomycin; wheat extract; duloxetine; gabapentin; acetaminophen; ibuprofen; pregabalin; nortriptyline; sulfa antibiotics; and sulfacetamide sodium-sulfur.  MEDICATIONS:  Current Outpatient Medications  Medication Sig Dispense Refill  . baclofen (LIORESAL) 10 MG tablet Take 1 tablet (10 mg total) by mouth 3 (three) times daily. 90 tablet 3  . Calcipotriene-Betameth  Diprop (ENSTILAR) 0.005-0.064 % FOAM Apply 1 application topically 2 (two) times daily.     . clorazepate (TRANXENE) 15 MG tablet Take 7.5-15 mg by mouth. Take 7.5 morning and afternoon, and then take 46ms at bedtime  0  . desonide (DESOWEN) 0.05 % ointment APPLY TO AFFECTED AREAS TWICE DAILY AS NEEDED FOR PSORIASIS.  2  . furosemide (LASIX) 20 MG tablet Take 1 tablet (20 mg total) by mouth daily as needed for fluid or edema. 30 tablet 11  . morphine (KADIAN) 60 MG 24 hr capsule Take 1 capsule (60 mg total) by mouth every 12 (twelve) hours. 60 capsule 0  . NONFORMULARY OR COMPOUNDED ITEM Apply 1 application topically 3 (three) times daily. 8% ketamine, 5% amitriptyline, 5% baclofen, 5% gabapentin  60 GM 1 each 5  . nortriptyline (PAMELOR) 10 MG capsule Take 1-2 capsules (10-20 mg total) by mouth at bedtime. 60 capsule 2  . ondansetron (ZOFRAN) 4 MG tablet Take 1 tablet (4 mg total) by mouth every 8 (eight) hours as needed for nausea or vomiting. 30 tablet 0  . Oxycodone HCl 10 MG TABS Take 1 tablet (10 mg total) by mouth every 8 (eight) hours as needed. 90 tablet 0  . potassium chloride (K-DUR) 10 MEQ tablet Take 1 tablet (10 mEq total) by mouth daily as needed. Take every time you take the Lasix (furosemide). 30 tablet 11  . testosterone cypionate (DEPO-TESTOSTERONE) 200 MG/ML injection Inject 0.25 mLs (50 mg total) into the muscle once a week. (Patient taking differently: Inject 100 mg into the muscle once a week. Friday) 10 mL 0  . ustekinumab (STELARA) 90  MG/ML SOSY injection Inject 90 mg into the skin as directed. q 90 days    . venlafaxine XR (EFFEXOR-XR) 150 MG 24 hr capsule Take 300 mg by mouth daily with breakfast.    . XARELTO 20 MG TABS tablet TAKE 1 TABLET DAILY WITH SUPPER 90 tablet 1  . clindamycin (CLEOCIN) 150 MG capsule Take 300 mg by mouth as needed (DENTAL APPT). Reported on 09/14/2015     No current facility-administered medications for this visit.     REVIEW OF SYSTEMS:   Constitutional: Denies fevers, chills  Eyes: Denies blurriness of vision, double vision or watery eyes Ears, nose, mouth, throat, and face: Denies mucositis or sore throat  Respiratory: Denies cough, dyspnea or wheezes Cardiovascular: Denies palpitation, chest discomfort or lower extremity swelling Gastrointestinal:  Denies nausea (+) abdominal pain and splenomegaly  Skin: Hx of psoriasis and arthropathy from it.  Lymphatics: Denies new lymphadenopathy or easy bruising, hx of right parotitis twice. Neurological: (+) Numbness knees to feet MSK: sever back pain due to spinal compression, (+) splinting of left knee and lower right leg  Behavioral/Psych: Mood is stable, no new changes,  All other systems were reviewed with the patient and are negative.  PHYSICAL EXAMINATION: ECOG PERFORMANCE STATUS: 3  Vitals:   01/07/17 1308  BP: 117/87  Pulse: (!) 106  Resp: 17  Temp: 98.4 F (36.9 C)  TempSrc: Oral  SpO2: 98%  Height: _0  (1.753 m)    GENERAL:alert, no distress and comfortable, in a wheelchair  SKIN: skin color, texture, turgor are normal, no rashes or significant lesions EYES: normal, conjunctiva are pink and non-injected, sclera clear except small bleeding in the inner of right sclera  OROPHARYNX:no exudate, no erythema and lips, buccal mucosa, and tongue normal  NECK: supple, thyroid normal size, non-tender, without nodularity LYMPH:  no palpable lymphadenopathy in the cervical,  axillary or inguinal LUNGS: clear to auscultation and  percussion with normal breathing effort HEART: regular rate & rhythm and no murmurs and no lower extremity edema ABDOMEN:abdomen soft, non-tender and normal bowel sounds Musculoskeletal:no cyanosis of digits and no clubbing  PSYCH: alert & oriented x 3 NEURO: no focal motor/sensory deficits EXT: The surgical scar at the left knee has healed well, the range of motion of left knee has improved. He wares braces for both leg. No pitting edema.  LABORATORY DATA:  I have reviewed the data as listed CBC Latest Ref Rng & Units 01/07/2017 10/22/2016 07/09/2016  WBC 4.0 - 10.3 10e3/uL 3.7(L) 3.4(L) 3.6(L)  Hemoglobin 13.0 - 17.1 g/dL 12.1(L) 11.2(L) 12.3(L)  Hematocrit 38.4 - 49.9 % 39.0 36.9(L) 38.1(L)  Platelets 140 - 400 10e3/uL 213 158 196   CMP Latest Ref Rng & Units 01/07/2017 07/09/2016 05/14/2016  Glucose 70 - 140 mg/dl 108 111 125  BUN 7.0 - 26.0 mg/dL 18.2 10.8 12.3  Creatinine 0.7 - 1.3 mg/dL 1.1 1.1 1.2  Sodium 136 - 145 mEq/L 138 138 137  Potassium 3.5 - 5.1 mEq/L 4.8 4.2 4.1  Chloride 101 - 111 mmol/L - - -  CO2 22 - 29 mEq/L _0 Calcium 8.4 - 10.4 mg/dL 9.2 9.1 9.5  Total Protein 6.4 - 8.3 g/dL 6.9 6.5 7.2  Total Bilirubin 0.20 - 1.20 mg/dL 0.58 0.34 0.58  Alkaline Phos 40 - 150 U/L 453(H) 258(H) 457(H)  AST 5 - 34 U/L 57(H) 33 35(H)  ALT 0 - 55 U/L 78(H) 37 61(H)     Pathology report Diagnosis Bone Marrow, Aspirate,Biopsy, and Clot, right iliac - SLIGHTLY HYPERCELLULAR BONE MARROW FOR AGE WITH TRILINEAGE HEMATOPOIESIS. - SEE COMMENT. PERIPHERAL BLOOD: - NO SIGNIFICANT MORPHOLOGIC ABNORMALITIES. Diagnosis Note The bone marrow is slightly hypercellular with trilineage hematopoiesis and essentially orderly and progressive maturation of all cell types. Significant dyspoiesis is not present. Iron stores are markedly decreased to absent and hence correlation with serum iron studies is recommended. There is no morphologic evidence of a myeloproliferative process in this  material. Correlation with cytogenetic studies is recommended. (BNS:ecj May 06, 2014)   RADIOGRAPHIC STUDIES: No results found.  MRI Lumbar Spine WO Contrast 04/16/16 IMPRESSION: 1. Advanced L5-S1 disc degeneration with mild bilateral neural foraminal stenosis. 2. Shallow left foraminal disc protrusion at L3-4 and mild disc bulging at L4-5 without stenosis. 3. Mild multilevel posterior element hypertrophy  MRI Cervical Spine WO Contrast 04/16/16 IMPRESSION: 1. No significant interval change in size and appearance of syrinx extending from C4 through C6 as above. 2. Mild multilevel degenerative spondylolysis as above without significant canal or foraminal stenosis.   ASSESSMENT & PLAN: 51 y.o. transgender male  15. 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 He has slightly hypercellular bone marrow with trilineage hematopoiesis.  No morphology evidence of myeloproliferative process. Cytogenetics was normal (no Y chromosome).  JAK2 mutation was negative. -He had Hemoglobin 18.7 hematocrit 53.1 in 04/2013. His epo level was normal. His presentation is consistent with secondary polycythemia, likely secondary to testosterone injections. 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 Cordelia Pen advise to low his testosterone injection dose.  -We previously discussed that secondary polycythemia is usually benign, risk of thrombosis is relatively low than PV. However he did have history of stroke, and he previously feels fatigued when his hemoglobin is high.  He agrees to continue phlebotomy as needed. -Due to his multiple orthopedic surgeries, he has developed mild anemia, has not required phlebotomy lately. -Labs reviewed, His HCT is 39.0 and he has elevated alkaline phosphatase and Liver enzymes. No need for phlebotomy  today. Based on the MCV low he is anemic but does not need a blood transfusion as this can increase his HCT.  - I explained PV may cause splenomegaly, although I believe his polycythemia is likely secondary, unlikely.  Based on negative Jak 2 and previous bone marrow biopsy.  I recommend continuing with phlebotomy for HCT>45% and to monitor HCT every 3 months and continue with Xarelto.  -f/u in 6 months    2. History of DVT and stroke, Protein C deficiency  - continue Xarelto indefinitely given his underlying protein C deficiency and history of DVT and stroke. His previous DVT was provoked by foot injury. -Continue Xarelto indefinitely. Okay to hold for 1-2 days before joint injection by his rheumatologist. -I suggest compression socks when traveling and to be active if he can.   3. History of CVA  4. 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 I prefer him to maintain low normal level of testosterone.   5. RA -He is on Secukinumab.  6. Elevated alkaline phosphatase and transaminitis -He has intermittent mild transaminitis, resolved now. He also has significantly elevated alkaline phosphatase  -His primary care physician has noticed the above change and will follow up on that.  -He will continue to follow his GI for this.   7. Back Pain -Has gets nausea and sweats from pain -continue Zofran for nausea   8. Mild splenomegaly -His recent abdominal ultrasound showed mild splenomegaly, no lesions in the spleen -He is currently undergoing GI workup to rule out early liver cirrhosis  PLAN: -labs in 3 months, phlebotomy if HCT>45% -labs and f/u in 6 months  -Continue Xarelto, I encouraged him to be physically active, and exercise  All questions were answered. The patient knows to call the clinic with any problems, questions or concerns.  I spent 20 minutes  counseling the patient face to face. The total time spent in the appointment was 25 minutes and more than 50% was on counseling.  This document serves as a record of services personally performed by [name], MD. It was created on her behalf by Joslyn Devon, a trained medical scribe. The creation of this record is based on the scribe's personal observations and the provider's statements to them.    I have reviewed the above documentation for accuracy and completeness, and I agree with the above.    [name]  01/07/2017

## 2017-01-06 ENCOUNTER — Ambulatory Visit: Payer: Self-pay | Admitting: Psychology

## 2017-01-07 ENCOUNTER — Encounter: Payer: BLUE CROSS/BLUE SHIELD | Admitting: Physical Medicine & Rehabilitation

## 2017-01-07 ENCOUNTER — Telehealth: Payer: Self-pay | Admitting: Hematology

## 2017-01-07 ENCOUNTER — Ambulatory Visit (HOSPITAL_BASED_OUTPATIENT_CLINIC_OR_DEPARTMENT_OTHER): Payer: BLUE CROSS/BLUE SHIELD | Admitting: Hematology

## 2017-01-07 ENCOUNTER — Other Ambulatory Visit (HOSPITAL_BASED_OUTPATIENT_CLINIC_OR_DEPARTMENT_OTHER): Payer: BLUE CROSS/BLUE SHIELD

## 2017-01-07 ENCOUNTER — Encounter: Payer: Self-pay | Admitting: Hematology

## 2017-01-07 VITALS — BP 117/87 | HR 106 | Temp 98.4°F | Resp 17 | Ht 69.0 in

## 2017-01-07 DIAGNOSIS — Z86718 Personal history of other venous thrombosis and embolism: Secondary | ICD-10-CM | POA: Diagnosis not present

## 2017-01-07 DIAGNOSIS — D751 Secondary polycythemia: Secondary | ICD-10-CM

## 2017-01-07 DIAGNOSIS — Z7901 Long term (current) use of anticoagulants: Secondary | ICD-10-CM

## 2017-01-07 DIAGNOSIS — M549 Dorsalgia, unspecified: Secondary | ICD-10-CM | POA: Diagnosis not present

## 2017-01-07 LAB — CBC WITH DIFFERENTIAL/PLATELET
BASO%: 0.1 % (ref 0.0–2.0)
BASOS ABS: 0 10*3/uL (ref 0.0–0.1)
EOS%: 0 % (ref 0.0–7.0)
Eosinophils Absolute: 0 10*3/uL (ref 0.0–0.5)
HEMATOCRIT: 39 % (ref 38.4–49.9)
HGB: 12.1 g/dL — ABNORMAL LOW (ref 13.0–17.1)
LYMPH#: 1 10*3/uL (ref 0.9–3.3)
LYMPH%: 27.5 % (ref 14.0–49.0)
MCH: 22.7 pg — AB (ref 27.2–33.4)
MCHC: 30.9 g/dL — AB (ref 32.0–36.0)
MCV: 73.4 fL — ABNORMAL LOW (ref 79.3–98.0)
MONO#: 0.3 10*3/uL (ref 0.1–0.9)
MONO%: 9 % (ref 0.0–14.0)
NEUT#: 2.4 10*3/uL (ref 1.5–6.5)
NEUT%: 63.4 % (ref 39.0–75.0)
PLATELETS: 213 10*3/uL (ref 140–400)
RBC: 5.31 10*6/uL (ref 4.20–5.82)
RDW: 16 % — ABNORMAL HIGH (ref 11.0–14.6)
WBC: 3.7 10*3/uL — ABNORMAL LOW (ref 4.0–10.3)

## 2017-01-07 LAB — COMPREHENSIVE METABOLIC PANEL
ALBUMIN: 4 g/dL (ref 3.5–5.0)
ALK PHOS: 453 U/L — AB (ref 40–150)
ALT: 78 U/L — AB (ref 0–55)
AST: 57 U/L — AB (ref 5–34)
Anion Gap: 8 mEq/L (ref 3–11)
BILIRUBIN TOTAL: 0.58 mg/dL (ref 0.20–1.20)
BUN: 18.2 mg/dL (ref 7.0–26.0)
CO2: 25 meq/L (ref 22–29)
Calcium: 9.2 mg/dL (ref 8.4–10.4)
Chloride: 105 mEq/L (ref 98–109)
Creatinine: 1.1 mg/dL (ref 0.7–1.3)
EGFR: >60 mL/min/{1.73_m2} (ref >60)
GLUCOSE: 108 mg/dL (ref 70–140)
Potassium: 4.8 mEq/L (ref 3.5–5.1)
SODIUM: 138 meq/L (ref 136–145)
TOTAL PROTEIN: 6.9 g/dL (ref 6.4–8.3)

## 2017-01-07 NOTE — Telephone Encounter (Signed)
Scheduled appt per 11/7 los - Gave patient AVS and calender per los.

## 2017-01-08 ENCOUNTER — Ambulatory Visit (INDEPENDENT_AMBULATORY_CARE_PROVIDER_SITE_OTHER): Payer: BLUE CROSS/BLUE SHIELD | Admitting: Psychology

## 2017-01-08 ENCOUNTER — Encounter: Payer: Self-pay | Admitting: Hematology

## 2017-01-08 DIAGNOSIS — F4310 Post-traumatic stress disorder, unspecified: Secondary | ICD-10-CM

## 2017-01-09 ENCOUNTER — Encounter: Payer: Self-pay | Admitting: Hematology

## 2017-01-13 ENCOUNTER — Encounter: Payer: Self-pay | Admitting: Physician Assistant

## 2017-01-13 ENCOUNTER — Ambulatory Visit (INDEPENDENT_AMBULATORY_CARE_PROVIDER_SITE_OTHER): Payer: BLUE CROSS/BLUE SHIELD | Admitting: Physician Assistant

## 2017-01-13 ENCOUNTER — Ambulatory Visit (INDEPENDENT_AMBULATORY_CARE_PROVIDER_SITE_OTHER): Payer: BLUE CROSS/BLUE SHIELD | Admitting: Psychology

## 2017-01-13 VITALS — BP 128/87 | HR 99 | Ht 69.0 in | Wt 241.0 lb

## 2017-01-13 DIAGNOSIS — R0609 Other forms of dyspnea: Secondary | ICD-10-CM

## 2017-01-13 DIAGNOSIS — R002 Palpitations: Secondary | ICD-10-CM

## 2017-01-13 DIAGNOSIS — R0600 Dyspnea, unspecified: Secondary | ICD-10-CM

## 2017-01-13 DIAGNOSIS — F4312 Post-traumatic stress disorder, chronic: Secondary | ICD-10-CM | POA: Diagnosis not present

## 2017-01-13 DIAGNOSIS — E8770 Fluid overload, unspecified: Secondary | ICD-10-CM | POA: Diagnosis not present

## 2017-01-13 DIAGNOSIS — R000 Tachycardia, unspecified: Secondary | ICD-10-CM | POA: Diagnosis not present

## 2017-01-13 LAB — BASIC METABOLIC PANEL
BUN/Creatinine Ratio: 11 (ref 9–20)
BUN: 13 mg/dL (ref 6–24)
CALCIUM: 9.4 mg/dL (ref 8.7–10.2)
CO2: 22 mmol/L (ref 20–29)
CREATININE: 1.18 mg/dL (ref 0.76–1.27)
Chloride: 101 mmol/L (ref 96–106)
GFR calc Af Amer: 82 mL/min/{1.73_m2} (ref >59)
GFR, EST NON AFRICAN AMERICAN: 71 mL/min/{1.73_m2} (ref >59)
GLUCOSE: 92 mg/dL (ref 65–99)
POTASSIUM: 4.5 mmol/L (ref 3.5–5.2)
SODIUM: 138 mmol/L (ref 134–144)

## 2017-01-13 NOTE — Patient Instructions (Addendum)
Your physician recommends that you return for lab work TODAY - BMET  Your physician wants you to follow-up in: 6 months with [place], [place]. You will receive a reminder letter in the mail two months in advance. If you don't receive a letter, please call our office to schedule the follow-up appointment.

## 2017-01-13 NOTE — Progress Notes (Signed)
Cardiology Office Note   Date:  01/13/2017   ID:  Jesse Bowers, DOB April 29, 1965, MRN [id number]  PCP:  Concepcion Elk, MD  Cardiologist: Dr. Irish Lack, 10/04/2015 Rosaria Ferries, PA-C 12/03/2016  Chief Complaint  Patient presents with  . Follow-up    History of Present Illness: Jesse Bowers is a 51 y.o. 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  Added Lasix as needed, track daily weights follow up in 1 month  Jesse Bowers presents for cardiology follow up  He is still noticing an elevated HR. He is not on BP meds, but has prazosin at home to help w/ PTSD. He does not take it right now, but may have to start back.   His weight did not go down on Lasix 10 mg qd, but did go down on Lasix 20 mg qd. His dry weight is about 240 lbs. He has been tracking his weight, notices a correlation between higher sodium foods and weight gain. He is taking the Lasix prn, the 20 mg is helping.   He is tolerating therapies and working on getting stronger.  He is not having dyspnea on exertion, shortness of breath at rest, orthopnea or PND.  The lower extremity edema for which he was taking the Lasix has improved.  He is not having chest pain and feels his cardiac status is good.  He still feels his heart pounding at times, but it is not irregular.  He was reassured that his monitor only showed sinus tachycardia and no heart rates above 150.   Past Medical History:  Diagnosis Date  . Abnormal weight loss   . Anxiety    PTSD per patient  . Celiac disease   . Gender bias    prefers Male gender identity- "remains with vagina"  . Gluten enteropathy   . H/O parotitis    right   . H/O protein C deficiency     Dr. Anne Fu  . Hx-TIA (transient ischemic attack)    2015  . Neck pain   . Neuromuscular disorder ([place])    bilateral neuropathy feet.  . Polycythemia, secondary   . PONV (postoperative nausea and vomiting)   . psoriatic arthritis   . Sleep apnea    cpap use- Dr. [name] follows  . Stroke [facility])    Stroke left side -slight right sided weakness-Dr. [name] follows  . Syrinx of spinal cord ([place]) 01/06/2014   c spine on MRI  . Transfusion history    past history- none recent  . Transgendered     Past Surgical History:  Procedure Laterality Date  . ABDOMINAL HYSTERECTOMY     TAH, BSO- tranverse incision.  [name] ANKLE ARTHROSCOPY WITH RECONSTRUCTION Right 2007  . CHOLECYSTECTOMY     laparoscopic  . HIP ARTHROSCOPY W/ LABRAL REPAIR Right 05/11/2013  . KNEE SURGERY Bilateral 1984   Right ACL, left PCL repair  . LIVER BIOPSY  2013   normal results.  [name] NASAL SEPTUM SURGERY N/A 09/20/2015  . THYROIDECTOMY, PARTIAL  2008    Current Outpatient Medications  Medication Sig Dispense Refill  . baclofen (LIORESAL) 10 MG tablet Take 1 tablet (10 mg total) by mouth 3 (three) times daily. 90 tablet 3  . Calcipotriene-Betameth  Diprop (ENSTILAR) 0.005-0.064 % FOAM Apply 1 application topically 2 (two) times daily.     . clindamycin (CLEOCIN) 150 MG capsule Take 300 mg by mouth as needed (DENTAL APPT). Reported on 09/14/2015    . clorazepate (TRANXENE) 15 MG tablet Take 7.5-15 mg by mouth. Take 7.5 morning and afternoon, and then take 1ms at bedtime  0  . desonide (DESOWEN) 0.05 % ointment APPLY TO AFFECTED AREAS TWICE DAILY AS NEEDED FOR PSORIASIS.  2  . furosemide (LASIX) 20 MG tablet Take 1 tablet (20 mg total) by mouth daily as needed for fluid or edema. 30 tablet 11  . morphine (KADIAN) 60 MG 24 hr capsule Take 1 capsule (60 mg total) by mouth every 12 (twelve) hours. 60 capsule 0  . NONFORMULARY OR COMPOUNDED ITEM Apply 1 application topically 3 (three) times daily. 8% ketamine, 5%  amitriptyline, 5% baclofen, 5% gabapentin  60 GM 1 each 5  . nortriptyline (PAMELOR) 10 MG capsule Take 1-2 capsules (10-20 mg total) by mouth at bedtime. 60 capsule 2  . ondansetron (ZOFRAN) 4 MG tablet Take 1 tablet (4 mg total) by mouth every 8 (eight) hours as needed for nausea or vomiting. 30 tablet 0  . Oxycodone HCl 10 MG TABS Take 1 tablet (10 mg total) by mouth every 8 (eight) hours as needed. 90 tablet 0  . potassium chloride (K-DUR) 10 MEQ tablet Take 1 tablet (10 mEq total) by mouth daily as needed. Take every time you take the Lasix (furosemide). 30 tablet 11  . testosterone cypionate (DEPO-TESTOSTERONE) 200 MG/ML injection Inject 0.25 mLs (50 mg total) into the muscle once a week. (Patient taking differently: Inject 100 mg into the muscle once a week. Friday) 10 mL 0  . ustekinumab (STELARA) 90 MG/ML SOSY injection Inject 90 mg into the skin as directed. q 90 days    . venlafaxine XR (EFFEXOR-XR) 150 MG 24 hr capsule Take 300 mg by mouth daily with breakfast.    . XARELTO 20 MG TABS tablet TAKE 1 TABLET DAILY WITH SUPPER 90 tablet 1   No current facility-administered medications for this visit.     Allergies:   Penicillin g; Vancomycin; Wheat extract; Duloxetine; Gabapentin; Acetaminophen; Ibuprofen; Nortriptyline; Pregabalin; Sulfa antibiotics; and Sulfacetamide sodium-sulfur    Social History:  The patient  reports that  has never smoked. he has never used smokeless tobacco. He reports that he drinks alcohol. He reports that he does not use drugs.   Family History:  The patient's family history includes Cancer in his paternal grandfather; Congestive Heart Failure in his maternal grandmother; Heart attack in his maternal grandfather, maternal grandmother, and paternal grandfather; Hypertension in his mother; Protein C deficiency (age of onset: 355 in his sister; Psoriasis in his mother; Stroke in his maternal grandfather, maternal grandmother, and paternal uncle.    ROS:  Please  see the history of present illness. All other systems are reviewed and negative.    PHYSICAL EXAM: VS:  BP 128/87   Pulse 99   Ht 5' 9"  (1.753 m)   Wt 241 lb (109.3 kg)   BMI 35.59 kg/m  , BMI Body mass index is 35.59 kg/m. GEN: Well nourished, well developed, adult in no acute distress  HEENT: normal for age  Neck: no JVD, no carotid bruit, no masses Cardiac: RRR; soft murmur, no rubs, or gallops Respiratory:  clear to auscultation bilaterally, normal work of breathing GI: soft, nontender, nondistended, + BS MS: no deformity or atrophy; no edema; distal pulses  are 2+ in all 4 extremities   Skin: warm and dry, no rash Neuro:  Strength and sensation are intact Psych: euthymic mood, full affect   EKG:  EKG is not ordered today.   ECHO: 12/15/2016 - Left ventricle: The cavity size was normal. Wall thickness was   increased in a pattern of mild LVH. Systolic function was normal.   The estimated ejection fraction was in the range of 60% to 65%.   Wall motion was normal; there were no regional wall motion   abnormalities. Left ventricular diastolic function parameters   were normal. - Mitral valve: There was mild regurgitation. - Left atrium: The atrium was mildly dilated. - Atrial septum: No defect or patent foramen ovale was identified.   Recent Labs: 01/07/2017: ALT 78; BUN 18.2; Creatinine 1.1; HGB 12.1; Platelets 213; Potassium 4.8; Sodium 138    Lipid Panel No results found for: CHOL, TRIG, HDL, CHOLHDL, VLDL, LDLCALC, LDLDIRECT   Wt Readings from Last 3 Encounters:  01/13/17 241 lb (109.3 kg)  12/03/16 242 lb (109.8 kg)  02/06/16 239 lb (108.4 kg)     Other studies Reviewed: Additional studies/ records that were reviewed today include: Office notes and testing.  ASSESSMENT AND PLAN:  1.  Volume overload: Both his systolic function and diastolic function parameters on his echo were normal.  His volume status improved with a low dose of Lasix.  By his description  of his p.o. intake and daily weights, he is very sodium sensitive.  I explained to him that if he continued to manage his sodium, he would not have to take the Lasix very often.  I encouraged him to take it as little as possible, but keep his weight stable.  Check a be met to make sure his electrolytes and renal function are stable.  2.  Tachycardia: I reassured the patient that nothing but sinus tachycardia was seen.  His heart rate does run high at baseline.  Because of his anxiety issues and PTSD, he may have higher intrinsic levels of catecholamines than normal.  I advised that we can start a beta-blocker but I was not sure that his blood pressure would tolerate both a beta-blocker and the prazosin.  He may have to restart the prazosin to treat the PTSD.  He sees his provider later this week.  He will discussed the situation with him and if he needs to start the prazosin back, he will track his blood pressure and let us know if he wishes to start the beta-blocker.  If he wishes to start a beta-blocker to try to limit the tachycardia, I would use metoprolol 12.5 mg twice daily.  For now, no med changes.  Current medicines are reviewed at length with the patient today.  The patient does not have concerns regarding medicines.  The following changes have been made:  no change  Labs/ tests ordered today include:   Orders Placed This Encounter  Procedures  . Basic metabolic panel     Disposition:   FU with Dr. Irish Lack or myself in 6 months  Signed, Lenoard Aden  01/13/2017 1:25 PM    [facility] Phone: [phone]; Fax: [phone]  This note was written with the assistance of speech recognition software. Please excuse any transcriptional errors.

## 2017-01-15 ENCOUNTER — Ambulatory Visit: Payer: Self-pay | Admitting: Registered Nurse

## 2017-01-15 ENCOUNTER — Ambulatory Visit (INDEPENDENT_AMBULATORY_CARE_PROVIDER_SITE_OTHER): Payer: BLUE CROSS/BLUE SHIELD | Admitting: Psychology

## 2017-01-15 DIAGNOSIS — F4310 Post-traumatic stress disorder, unspecified: Secondary | ICD-10-CM

## 2017-01-19 ENCOUNTER — Other Ambulatory Visit: Payer: Self-pay

## 2017-01-19 ENCOUNTER — Encounter
Payer: BLUE CROSS/BLUE SHIELD | Attending: Physical Medicine & Rehabilitation | Admitting: Physical Medicine & Rehabilitation

## 2017-01-19 ENCOUNTER — Encounter: Payer: Self-pay | Admitting: Physical Medicine & Rehabilitation

## 2017-01-19 DIAGNOSIS — Z9181 History of falling: Secondary | ICD-10-CM | POA: Insufficient documentation

## 2017-01-19 DIAGNOSIS — Z86718 Personal history of other venous thrombosis and embolism: Secondary | ICD-10-CM | POA: Diagnosis not present

## 2017-01-19 DIAGNOSIS — I69351 Hemiplegia and hemiparesis following cerebral infarction affecting right dominant side: Secondary | ICD-10-CM | POA: Insufficient documentation

## 2017-01-19 DIAGNOSIS — M792 Neuralgia and neuritis, unspecified: Secondary | ICD-10-CM

## 2017-01-19 DIAGNOSIS — M7522 Bicipital tendinitis, left shoulder: Secondary | ICD-10-CM | POA: Diagnosis not present

## 2017-01-19 DIAGNOSIS — K900 Celiac disease: Secondary | ICD-10-CM | POA: Diagnosis not present

## 2017-01-19 DIAGNOSIS — I69320 Aphasia following cerebral infarction: Secondary | ICD-10-CM | POA: Diagnosis not present

## 2017-01-19 DIAGNOSIS — Z79891 Long term (current) use of opiate analgesic: Secondary | ICD-10-CM | POA: Insufficient documentation

## 2017-01-19 DIAGNOSIS — R209 Unspecified disturbances of skin sensation: Secondary | ICD-10-CM | POA: Insufficient documentation

## 2017-01-19 DIAGNOSIS — Z79899 Other long term (current) drug therapy: Secondary | ICD-10-CM | POA: Diagnosis not present

## 2017-01-19 DIAGNOSIS — D751 Secondary polycythemia: Secondary | ICD-10-CM | POA: Insufficient documentation

## 2017-01-19 DIAGNOSIS — Z87890 Personal history of sex reassignment: Secondary | ICD-10-CM | POA: Insufficient documentation

## 2017-01-19 DIAGNOSIS — M13862 Other specified arthritis, left knee: Secondary | ICD-10-CM | POA: Insufficient documentation

## 2017-01-19 DIAGNOSIS — G950 Syringomyelia and syringobulbia: Secondary | ICD-10-CM

## 2017-01-19 DIAGNOSIS — M545 Low back pain: Secondary | ICD-10-CM | POA: Insufficient documentation

## 2017-01-19 DIAGNOSIS — F418 Other specified anxiety disorders: Secondary | ICD-10-CM | POA: Insufficient documentation

## 2017-01-19 DIAGNOSIS — L4050 Arthropathic psoriasis, unspecified: Secondary | ICD-10-CM | POA: Diagnosis not present

## 2017-01-19 DIAGNOSIS — I69398 Other sequelae of cerebral infarction: Secondary | ICD-10-CM | POA: Diagnosis not present

## 2017-01-19 DIAGNOSIS — S43002A Unspecified subluxation of left shoulder joint, initial encounter: Secondary | ICD-10-CM | POA: Diagnosis not present

## 2017-01-19 DIAGNOSIS — D6859 Other primary thrombophilia: Secondary | ICD-10-CM | POA: Insufficient documentation

## 2017-01-19 DIAGNOSIS — G8929 Other chronic pain: Secondary | ICD-10-CM | POA: Diagnosis present

## 2017-01-19 DIAGNOSIS — Z7901 Long term (current) use of anticoagulants: Secondary | ICD-10-CM | POA: Diagnosis not present

## 2017-01-19 MED ORDER — BACLOFEN 10 MG PO TABS: 10.0000 mg | ORAL_TABLET | Freq: Three times a day (TID) | ORAL | 3 refills | Status: DC

## 2017-01-19 MED ORDER — MORPHINE SULFATE ER 60 MG PO CP24: 60.0000 mg | ORAL_CAPSULE | Freq: Two times a day (BID) | ORAL | 0 refills | Status: DC

## 2017-01-19 MED ORDER — OXYCODONE HCL 10 MG PO TABS: 10.0000 mg | ORAL_TABLET | Freq: Three times a day (TID) | ORAL | 0 refills | Status: DC | PRN

## 2017-01-19 NOTE — Patient Instructions (Signed)
PLEASE FEEL FREE TO CALL OUR OFFICE WITH ANY PROBLEMS OR QUESTIONS ([phone])      

## 2017-01-19 NOTE — Progress Notes (Signed)
Subjective:    Patient ID: Jesse Bowers, adult    DOB: 03-29-1965, 51 y.o.   MRN: [id number]  HPI   Tremell is here in follow up of his chronic pain.  He had good results with the baclofen has not helped his spasms and sleep to a large extent.  He has found more recently that the 10 mg dose is not quite as effective as it was initially.  The nortriptyline 10 mg did not have a large effect on his sleep, however he did not increase to 20 mg.  He remains on Kadian and oxycodone for pain relief which provide good results still.  He has been dealing with more left shoulder pain as of late which she attributes to the weather and to having to do more transfers and standing on his own which require his upper extremities for support     Pain Inventory Average Pain 8 Pain Right Now 8 My pain is sharp, stabbing, tingling and aching  In the last 24 hours, has pain interfered with the following? General activity 9 Relation with others 9 Enjoyment of life 9 What TIME of day is your pain at its worst? morning, evening  Sleep (in general) Poor  Pain is worse with: walking, bending, standing and some activites Pain improves with: rest, heat/ice, therapy/exercise and medication Relief from Meds: 3  Mobility ability to climb steps?  no do you drive?  yes use a wheelchair transfers alone  Function disabled: date disabled . I need assistance with the following:  feeding, bathing, meal prep, household duties and shopping Do you have any goals in this area?  yes  Neuro/Psych bladder control problems weakness numbness tremor tingling trouble walking spasms anxiety  Prior Studies Any changes since last visit?  no  Physicians involved in your care Any changes since last visit?  no   Family History  Problem Relation Age of Onset  . Stroke Maternal Grandfather        87  . Heart attack Maternal Grandfather   . Hypertension Mother   . Psoriasis Mother   . Cancer Paternal  Grandfather   . Heart attack Paternal Grandfather   . Stroke Paternal Uncle        age 19  . Stroke Maternal Grandmother   . Congestive Heart Failure Maternal Grandmother   . Heart attack Maternal Grandmother   . Protein C deficiency Sister 74       Miscarriages   Social History   Socioeconomic History  . Marital status: Married    Spouse name: None  . Number of children: 2  . Years of education: 4y college  . Highest education level: None  Social Needs  . Financial resource strain: None  . Food insecurity - worry: None  . Food insecurity - inability: None  . Transportation needs - medical: None  . Transportation needs - non-medical: None  Occupational History  . Occupation: Pediatric Nurse practitioner    Comment: Not working since [place] 2015  Tobacco Use  . Smoking status: Never Smoker  . Smokeless tobacco: Never Used  Substance and Sexual Activity  . Alcohol use: Yes    Comment: wine once a month  . Drug use: No  . Sexual activity: Yes    Birth control/protection: None    Comment: patient is a transgender on testosterone shots, no biological kids  Other Topics Concern  . None  Social History Narrative   Education 4 year college, former Therapist, sports X 15 years, pediatric  nurse practitioner x 6 years, did NP degree from [place] of [place]. Relocated to [place] about 2 months ago from [place], [place]. Patient was in [place] for last 4 years and prior to that in [place]. His wife is working as Scientist, research (physical sciences) for [employer]. Patient is not working and applying for disability. They have 2 kids but no biologic children.    Past Surgical History:  Procedure Laterality Date  . ABDOMINAL HYSTERECTOMY     TAH, BSO- tranverse incision.  [name] ANKLE ARTHROSCOPY WITH RECONSTRUCTION Right 2007  . CHOLECYSTECTOMY     laparoscopic  . HIP ARTHROSCOPY W/ LABRAL REPAIR Right 05/11/2013  . KNEE SURGERY Bilateral 1984   Right ACL, left PCL repair  . LEFT TOTAL KNEE REVISION Left 02/06/2016    Performed by [name], [facility] at [facility]  . LIVER BIOPSY  2013   normal results.  [name] NASAL SEPTUM SURGERY N/A 09/20/2015  . THYROIDECTOMY, PARTIAL  2008   Past Medical History:  Diagnosis Date  . Abnormal weight loss   . Anxiety    PTSD per patient  . Celiac disease   . Gender bias    prefers Male gender identity- "remains with vagina"  . Gluten enteropathy   . H/O parotitis    right   . H/O protein C deficiency    Dr. Anne Fu  . Hx-TIA (transient ischemic attack)    2015  . Neck pain   . Neuromuscular disorder ([place])    bilateral neuropathy feet.  . Polycythemia, secondary   . PONV (postoperative nausea and vomiting)   . psoriatic arthritis   . Sleep apnea    cpap use- Dr. [name] follows  . Stroke [facility])    Stroke left side -slight right sided weakness-Dr. [name] follows  . Syrinx of spinal cord ([place]) 01/06/2014   c spine on MRI  . Transfusion history    past history- none recent  . Transgendered    BP 119/80 (BP Location: Left Arm, Patient Position: Sitting, Cuff Size: Normal)   Pulse 87   Resp 14   SpO2 96%   Opioid Risk Score:   Fall Risk Score:  `1  Depression screen PHQ 2/9  Depression screen [facility] 2/9 11/10/2016 09/15/2016 06/18/2015 05/24/2015  Decreased Interest 0 0 2 2  Down, Depressed, Hopeless 0 0 0 0  PHQ - 2 Score 0 0 2 2  Some recent data might be hidden  ' Review of Systems  HENT: Negative.   Respiratory: Negative.   Cardiovascular: Negative.   Gastrointestinal: Positive for abdominal pain and constipation.  Endocrine: Negative.   Genitourinary: Negative.   Musculoskeletal: Positive for gait problem.       Spasms  Skin: Negative.   Neurological: Positive for tremors, weakness and numbness.       Tingling  Psychiatric/Behavioral: The patient is nervous/anxious.   All other systems reviewed and are negative.      Objective:   Physical Exam  General: NAD HEENT:PERRL,  Neck:Supple without JVD or lymphadenopathy  Heart: RRR Chest:  CTA Abdomen:soft Extremities:No clubbing, cyanosis, or edema. Pulses are 2+  Skin:Clean and intact without signs of breakdown. Left TKA scar.  Neuro:Pt is pleasant and alert and oriented x 3. He stutters and has word finding deficits. He is able to converse intelligently and has good insight and awareness. His short term memory is functional on a conversational level. Minimalright facial weakness and sensory loss is appreciated. UES: 4+ to 5/5 LUE with some give away at the  shoulder. RUE: 4- to 4 deltoid, bicep, tricep, wrist and grip. RLE: 3+ to 4/5 hf,  ke and ankle df. LE: 3+ to 4/5 prox to /5 distal . DTR's remain hyperactive in the lower extremities.  No resting tone.  Sensory loss 1+/2 Right face, hand, and leg is unchanged.  Musculoskeletal:shoulders with improved ROM. His left foot with chronic PA changes. Mild varus deformity.--- Stable today Psych:Pt's affect is appropriate. Pt is cooperative. Is distracted at times   Assessment & Plan:  1. Psoriatic arthritis with pain in multiple areas, most prominently feet, hands, elbows. His pain is also related to his prior CVA and associated motor/sensory changes/spasticity.  2. 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  3. Hx of left total knee revision 02/06/16. 4. 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 Left S1 radiculopathy? 5. Protein C deficiency  6. Left shoulder subluxation, bicipital tendonitis  7. Central sleep apnea  8. Polycythemia  9. Depression with anxiety.  10. Left lateral epidondylitis   Plan:  1. Rheumatological treatment per rheum MD  2. Maintain HEP, safety awareness which we discussed today.  3. Voltaren gel to hands, feet, knees, elbows if ok with H/O  4. Continue baclofen for lower extermity spasms/back pain. Can increase to 10-71m TID prn, #150 5. Refilled kadian [id number]  #60 and oxycodone [id number] today. . We will continue the opioid monitoring program, this consists of regular clinic visits, examinations, routine drug screening, pill counts as well as use of [place].   6. Increase pamelor to 53m84ms for sleep and neuropathic pain  15 minutes of face to face patient care time were spent during this visit. All questions were encouraged and answered.  Follow up in a month with NP

## 2017-01-19 NOTE — Telephone Encounter (Signed)
Patient called stating that his refill for baclofen was sent to the wrong pharmacy and wants it resent to [facility] [address].

## 2017-01-19 NOTE — Telephone Encounter (Signed)
Called patient, removed excess pharmacies and reordered medication and sent to correct pharmacy.

## 2017-01-20 ENCOUNTER — Ambulatory Visit (INDEPENDENT_AMBULATORY_CARE_PROVIDER_SITE_OTHER): Payer: BLUE CROSS/BLUE SHIELD | Admitting: Psychology

## 2017-01-20 DIAGNOSIS — F4312 Post-traumatic stress disorder, chronic: Secondary | ICD-10-CM

## 2017-01-27 ENCOUNTER — Ambulatory Visit (INDEPENDENT_AMBULATORY_CARE_PROVIDER_SITE_OTHER): Payer: BLUE CROSS/BLUE SHIELD | Admitting: Psychology

## 2017-01-27 DIAGNOSIS — F4312 Post-traumatic stress disorder, chronic: Secondary | ICD-10-CM

## 2017-01-29 ENCOUNTER — Ambulatory Visit: Payer: Self-pay | Admitting: Psychology

## 2017-01-31 ENCOUNTER — Encounter: Payer: Self-pay | Admitting: Hematology

## 2017-02-03 ENCOUNTER — Ambulatory Visit (INDEPENDENT_AMBULATORY_CARE_PROVIDER_SITE_OTHER): Payer: BLUE CROSS/BLUE SHIELD | Admitting: Psychology

## 2017-02-03 DIAGNOSIS — F4312 Post-traumatic stress disorder, chronic: Secondary | ICD-10-CM | POA: Diagnosis not present

## 2017-02-05 ENCOUNTER — Ambulatory Visit: Payer: Self-pay | Admitting: Psychology

## 2017-02-10 ENCOUNTER — Ambulatory Visit: Payer: Self-pay | Admitting: Psychology

## 2017-02-11 ENCOUNTER — Encounter: Payer: BLUE CROSS/BLUE SHIELD | Admitting: Registered Nurse

## 2017-02-12 ENCOUNTER — Ambulatory Visit: Payer: Self-pay | Admitting: Psychology

## 2017-02-17 ENCOUNTER — Ambulatory Visit (INDEPENDENT_AMBULATORY_CARE_PROVIDER_SITE_OTHER): Payer: BLUE CROSS/BLUE SHIELD | Admitting: Psychology

## 2017-02-17 DIAGNOSIS — F4312 Post-traumatic stress disorder, chronic: Secondary | ICD-10-CM | POA: Diagnosis not present

## 2017-02-18 ENCOUNTER — Encounter: Payer: Self-pay | Admitting: Registered Nurse

## 2017-02-18 ENCOUNTER — Encounter: Payer: BLUE CROSS/BLUE SHIELD | Attending: Physical Medicine & Rehabilitation | Admitting: Registered Nurse

## 2017-02-18 ENCOUNTER — Other Ambulatory Visit: Payer: Self-pay

## 2017-02-18 VITALS — BP 141/86 | HR 104

## 2017-02-18 DIAGNOSIS — T84018S Broken internal joint prosthesis, other site, sequela: Secondary | ICD-10-CM | POA: Diagnosis not present

## 2017-02-18 DIAGNOSIS — Z79899 Other long term (current) drug therapy: Secondary | ICD-10-CM

## 2017-02-18 DIAGNOSIS — G950 Syringomyelia and syringobulbia: Secondary | ICD-10-CM | POA: Diagnosis not present

## 2017-02-18 DIAGNOSIS — M7522 Bicipital tendinitis, left shoulder: Secondary | ICD-10-CM | POA: Diagnosis not present

## 2017-02-18 DIAGNOSIS — Z96659 Presence of unspecified artificial knee joint: Secondary | ICD-10-CM

## 2017-02-18 DIAGNOSIS — M542 Cervicalgia: Secondary | ICD-10-CM | POA: Diagnosis not present

## 2017-02-18 DIAGNOSIS — Z7901 Long term (current) use of anticoagulants: Secondary | ICD-10-CM | POA: Diagnosis not present

## 2017-02-18 DIAGNOSIS — Z86718 Personal history of other venous thrombosis and embolism: Secondary | ICD-10-CM | POA: Insufficient documentation

## 2017-02-18 DIAGNOSIS — L4050 Arthropathic psoriasis, unspecified: Secondary | ICD-10-CM | POA: Diagnosis not present

## 2017-02-18 DIAGNOSIS — M545 Low back pain, unspecified: Secondary | ICD-10-CM

## 2017-02-18 DIAGNOSIS — I69351 Hemiplegia and hemiparesis following cerebral infarction affecting right dominant side: Secondary | ICD-10-CM | POA: Diagnosis not present

## 2017-02-18 DIAGNOSIS — G894 Chronic pain syndrome: Secondary | ICD-10-CM | POA: Diagnosis not present

## 2017-02-18 DIAGNOSIS — M13862 Other specified arthritis, left knee: Secondary | ICD-10-CM | POA: Diagnosis not present

## 2017-02-18 DIAGNOSIS — I69320 Aphasia following cerebral infarction: Secondary | ICD-10-CM | POA: Diagnosis not present

## 2017-02-18 DIAGNOSIS — R209 Unspecified disturbances of skin sensation: Secondary | ICD-10-CM | POA: Diagnosis not present

## 2017-02-18 DIAGNOSIS — I69398 Other sequelae of cerebral infarction: Secondary | ICD-10-CM | POA: Diagnosis not present

## 2017-02-18 DIAGNOSIS — Z87890 Personal history of sex reassignment: Secondary | ICD-10-CM | POA: Insufficient documentation

## 2017-02-18 DIAGNOSIS — M7712 Lateral epicondylitis, left elbow: Secondary | ICD-10-CM

## 2017-02-18 DIAGNOSIS — M792 Neuralgia and neuritis, unspecified: Secondary | ICD-10-CM

## 2017-02-18 DIAGNOSIS — S43002A Unspecified subluxation of left shoulder joint, initial encounter: Secondary | ICD-10-CM | POA: Diagnosis not present

## 2017-02-18 DIAGNOSIS — Z79891 Long term (current) use of opiate analgesic: Secondary | ICD-10-CM | POA: Insufficient documentation

## 2017-02-18 DIAGNOSIS — K900 Celiac disease: Secondary | ICD-10-CM | POA: Diagnosis not present

## 2017-02-18 DIAGNOSIS — Z5181 Encounter for therapeutic drug level monitoring: Secondary | ICD-10-CM | POA: Diagnosis not present

## 2017-02-18 DIAGNOSIS — D6859 Other primary thrombophilia: Secondary | ICD-10-CM | POA: Diagnosis not present

## 2017-02-18 DIAGNOSIS — D751 Secondary polycythemia: Secondary | ICD-10-CM | POA: Insufficient documentation

## 2017-02-18 DIAGNOSIS — F418 Other specified anxiety disorders: Secondary | ICD-10-CM | POA: Diagnosis not present

## 2017-02-18 DIAGNOSIS — Z9181 History of falling: Secondary | ICD-10-CM | POA: Insufficient documentation

## 2017-02-18 DIAGNOSIS — G8929 Other chronic pain: Secondary | ICD-10-CM | POA: Diagnosis present

## 2017-02-18 DIAGNOSIS — M25512 Pain in left shoulder: Secondary | ICD-10-CM

## 2017-02-18 MED ORDER — MORPHINE SULFATE ER 60 MG PO CP24: 60.0000 mg | ORAL_CAPSULE | Freq: Two times a day (BID) | ORAL | 0 refills | Status: DC

## 2017-02-18 MED ORDER — BACLOFEN 20 MG PO TABS: 20.0000 mg | ORAL_TABLET | Freq: Three times a day (TID) | ORAL | 3 refills | Status: DC

## 2017-02-18 MED ORDER — OXYCODONE HCL 10 MG PO TABS: 10.0000 mg | ORAL_TABLET | Freq: Three times a day (TID) | ORAL | 0 refills | Status: DC | PRN

## 2017-02-18 NOTE — Progress Notes (Signed)
Subjective:    Patient ID: Jesse Bowers, adult    DOB: 06/10/1965, 51 y.o.   MRN: [id number]  HPI: [name] is a 51year old male who returns for follow-up appointment for chronic pain and medication refill. He states his pain is located in his neck, left elbow, left shoulder,lower back, and left knee. Also reports he will be having left knee surgery in February 2019. He rates his pain 7. His current exercise regime iswalking with his cane short distances in his home and performing stretching exercises.  [name] s/p Left Shoulder Surgery with Dr. Onnie Graham on 11/14/2016. He also states he would like to speak with Dr. Naaman Bowers regarding his analgesics, he believes he has built up a tolerance and his pain is not being managed with his current prescription. He is scheduled to see Dr. Naaman Bowers to discuss the above he verbalizes understanding.   [name] Morphine equivalent is 165.00 MME. He  is also prescribed Clorazepate by Dr. Erling Cruz. We have discussed the black box warning of using opioids and benzodiazepines. He is being closely monitored and under the care of his psychiatrist Dr. Erling Cruz.  I highlighted the dangers of using these drugs together and discussed the adverse events including respiratory suppression, overdose, cognitive impairment and importance of  compliance with current regimen. He verbalizes understanding, we will continue to monitor and adjust as indicated.     Last UDS was performed on 08/08/2016, it was consistent.   S/PLeft Total Knee Revision on 02/06/2016 by Dr. Wynelle Link  Pain Inventory Average Pain 7 Pain Right Now 7 My pain is aching  In the last 24 hours, has pain interfered with the following? General activity 9 Relation with others 9 Enjoyment of life 10 What TIME of day is your pain at its worst? daytime and night Sleep (in general) Poor  Pain is worse with: walking and bending Pain improves with: medication and injections Relief from Meds:  3  Mobility walk with assistance use a walker ability to climb steps?  yes do you drive?  yes use a wheelchair transfers alone  Function disabled: date disabled 10/2012 I need assistance with the following:  feeding, dressing, bathing, meal prep, household duties and shopping Do you have any goals in this area?  no  Neuro/Psych bladder control problems weakness numbness tremor trouble walking spasms anxiety  Prior Studies Any changes since last visit?  no  Physicians involved in your care Any changes since last visit?  no   Family History  Problem Relation Age of Onset  . Stroke Maternal Grandfather        61  . Heart attack Maternal Grandfather   . Hypertension Mother   . Psoriasis Mother   . Cancer Paternal Grandfather   . Heart attack Paternal Grandfather   . Stroke Paternal Uncle        age 40  . Stroke Maternal Grandmother   . Congestive Heart Failure Maternal Grandmother   . Heart attack Maternal Grandmother   . Protein C deficiency Sister 83       Miscarriages   Social History   Socioeconomic History  . Marital status: Married    Spouse name: Not on file  . Number of children: 2  . Years of education: 4y college  . Highest education level: Not on file  Social Needs  . Financial resource strain: Not on file  . Food insecurity - worry: Not on file  . Food insecurity - inability: Not on file  .  Transportation needs - medical: Not on file  . Transportation needs - non-medical: Not on file  Occupational History  . Occupation: Pediatric Nurse practitioner    Comment: Not working since [place] 2015  Tobacco Use  . Smoking status: Never Smoker  . Smokeless tobacco: Never Used  Substance and Sexual Activity  . Alcohol use: Yes    Comment: wine once a month  . Drug use: No  . Sexual activity: Yes    Birth control/protection: None    Comment: patient is a transgender on testosterone shots, no biological kids  Other Topics Concern  . Not on file   Social History Narrative   Education 4 year college, former Therapist, sports X 15 years, pediatric nurse practitioner x 6 years, did NP degree from [employer] [place]. Relocated to [place] about 2 months ago from [place], [place]. Patient was in [place] for last 4 years and prior to that in [place]. His wife is working as Scientist, research (physical sciences) for [employer]. Patient is not working and applying for disability. They have 2 kids but no biologic children.    Past Surgical History:  Procedure Laterality Date  . ABDOMINAL HYSTERECTOMY     TAH, BSO- tranverse incision.  [name] ANKLE ARTHROSCOPY WITH RECONSTRUCTION Right 2007  . CHOLECYSTECTOMY     laparoscopic  . HIP ARTHROSCOPY W/ LABRAL REPAIR Right 05/11/2013  . KNEE SURGERY Bilateral 1984   Right ACL, left PCL repair  . LIVER BIOPSY  2013   normal results.  [name] NASAL SEPTUM SURGERY N/A 09/20/2015  . THYROIDECTOMY, PARTIAL  2008  . TOTAL KNEE REVISION Left 02/06/2016   Procedure: LEFT TOTAL KNEE REVISION;  Surgeon: [name], MD;  Location: [facility];  Service: Orthopedics;  Laterality: Left;   Past Medical History:  Diagnosis Date  . Abnormal weight loss   . Anxiety    PTSD per patient  . Celiac disease   . Gender bias    prefers Male gender identity- "remains with vagina"  . Gluten enteropathy   . H/O parotitis    right   . H/O protein C deficiency    Dr. Anne Fu  . Hx-TIA (transient ischemic attack)    2015  . Neck pain   . Neuromuscular disorder ([place])    bilateral neuropathy feet.  . Polycythemia, secondary   . PONV (postoperative nausea and vomiting)   . psoriatic arthritis   . Sleep apnea    cpap use- Dr. [name] follows  . Stroke [facility])    Stroke left side -slight right sided weakness-Dr. [name] follows  . Syrinx of spinal cord ([place]) 01/06/2014   c spine on MRI  . Transfusion history    past history- none recent  . Transgendered    There were no vitals taken for this visit.  Opioid Risk Score:  0 Fall Risk Score:  `1  Depression  screen PHQ 2/9  Depression screen [facility] 2/9 02/18/2017 11/10/2016 09/15/2016 06/18/2015 05/24/2015  Decreased Interest 1 0 0 2 2  Down, Depressed, Hopeless 1 0 0 0 0  PHQ - 2 Score 2 0 0 2 2  Some recent data might be hidden      Review of Systems  Constitutional: Positive for unexpected weight change.  HENT: Negative.   Eyes: Negative.   Respiratory: Positive for shortness of breath.   Cardiovascular: Positive for leg swelling.  Gastrointestinal: Negative.   Endocrine: Negative.   Genitourinary:       Bladder control  Musculoskeletal: Positive for gait problem.  Spasms  Skin: Negative.   Allergic/Immunologic: Negative.   Neurological: Positive for tremors, weakness and numbness.       Tingling  Hematological: Negative.   Psychiatric/Behavioral: The patient is nervous/anxious.   All other systems reviewed and are negative.      Objective:   Physical Exam  Constitutional: He is oriented to person, place, and time. He appears well-developed and well-nourished.  HENT:  Head: Normocephalic and atraumatic.  Neck: Normal range of motion. Neck supple.  Cervical Paraspinal Tenderness: C-2-C-3  Cardiovascular: Normal rate and regular rhythm.  Pulmonary/Chest: Effort normal and breath sounds normal.  Musculoskeletal:  Normal Muscle Bulk and Muscle Testing Reveals: Upper Extremities: Full ROM and Muscle Strength 5/5  Left AC Joint Tenderness Lumbar Paraspinal Tenderness: L-4-L-5 Sacral Tenderness Noted Lower Extremities: Full ROM and Muscle Strength 5/5 Arrived in wheelchair  Neurological: He is alert and oriented to person, place, and time.  Skin: Skin is warm and dry.  Psychiatric: He has a normal mood and affect.  Nursing note and vitals reviewed.         Assessment & Plan:  1. Psoriatic arthritis with pain in multiple areas, most prominently feet, hands, elbows. Refilled:Kadian 60 mg 24 hr. Capsule, one capsule every 12 hours #60 and Oxycodone 10 mg one tablet  every 8 hours as needed #90. 02/18/2017 We will continue the opioid monitoring program, this consists of regular clinic visits, examinations, urine drug screen, pill counts as well as use of [place] Controlled Substance Reporting System.  Rheumatology Following. 02/18/2017 2. Chronic Pain Syndrome: Continue Compound Cream. 02/18/2017 3. 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 Patello-femoral arthritis left knee: Continue to monitor. 02/18/2017 S/P TKR on 07/03/14: Ortho Following: Dr. Wynelle Link perform Total Left Knee Revision on 02/06/2016. 02/18/2017 5. Chronic mid- low back pain: Continue current medication regime, and encourage to increase activity as tolerated.02/18/2017 6. Polycythemia: Oncology Following. 02/18/2017 7. Depression with anxiety : Continue to Monitor. 02/18/2017 8. Cervicalgia: Continue HEP as tolerated  and Continue to Monitor. Continue current medication regime.  02/18/2017 9. Muscle Spasm: Continue Flexeril. 02/18/2017 10. bilateral Ankle Pain: No complaints today.  Continue to Monitor 02/18/2017.  10. Left lateral epicondylitis: Ortho Following. 02/18/2017 11. Left Shoulder Pain: Ortho Following. S/P Surgery  02/18/2017 12. Lumbar Radiculitis: S/P ESI with Dr. Letta Pate. 10/06/2016, good relief noted.  20 minutes of face to face patient care time was spent during this visit. All questions were encouraged and answered.  F/U in 1 month

## 2017-02-19 ENCOUNTER — Ambulatory Visit (INDEPENDENT_AMBULATORY_CARE_PROVIDER_SITE_OTHER): Payer: BLUE CROSS/BLUE SHIELD | Admitting: Psychology

## 2017-02-19 DIAGNOSIS — F4310 Post-traumatic stress disorder, unspecified: Secondary | ICD-10-CM

## 2017-02-25 ENCOUNTER — Ambulatory Visit: Payer: Self-pay | Admitting: Orthopedic Surgery

## 2017-02-26 ENCOUNTER — Ambulatory Visit: Payer: Self-pay | Admitting: Psychology

## 2017-03-05 ENCOUNTER — Other Ambulatory Visit: Payer: Self-pay | Admitting: Physical Medicine & Rehabilitation

## 2017-03-05 ENCOUNTER — Ambulatory Visit: Payer: BLUE CROSS/BLUE SHIELD | Admitting: Psychology

## 2017-03-05 DIAGNOSIS — F4310 Post-traumatic stress disorder, unspecified: Secondary | ICD-10-CM

## 2017-03-05 DIAGNOSIS — M792 Neuralgia and neuritis, unspecified: Secondary | ICD-10-CM

## 2017-03-07 ENCOUNTER — Encounter: Payer: Self-pay | Admitting: Registered Nurse

## 2017-03-07 ENCOUNTER — Encounter: Payer: Self-pay | Admitting: Physician Assistant

## 2017-03-07 ENCOUNTER — Encounter: Payer: Self-pay | Admitting: Hematology

## 2017-03-09 ENCOUNTER — Encounter: Payer: Self-pay | Admitting: Hematology

## 2017-03-10 ENCOUNTER — Telehealth: Payer: Self-pay | Admitting: Registered Nurse

## 2017-03-10 ENCOUNTER — Telehealth: Payer: Self-pay | Admitting: *Deleted

## 2017-03-10 ENCOUNTER — Ambulatory Visit: Payer: BLUE CROSS/BLUE SHIELD | Admitting: Psychology

## 2017-03-10 ENCOUNTER — Encounter: Payer: Self-pay | Admitting: Registered Nurse

## 2017-03-10 ENCOUNTER — Encounter: Payer: Self-pay | Admitting: Hematology

## 2017-03-10 DIAGNOSIS — F4312 Post-traumatic stress disorder, chronic: Secondary | ICD-10-CM | POA: Diagnosis not present

## 2017-03-10 NOTE — Telephone Encounter (Signed)
-----   Message from Erma Heritage, [place] sent at 03/10/2017 12:34 PM EST ----- Regarding: Follow-Up Appointment I have been covering Rhonda's inbasket and this patient had sent in an advice request for worsening lower extremity edema over the past few weeks. Also needs clearance of upcoming surgery.  He is a [place] patient but has followed with Suanne Marker for the past year. Can you please get him a visit with her next week (or another APP if she does not have availability) and call him with that information.   Thank you for your help!  [place],  [place]

## 2017-03-10 NOTE — Telephone Encounter (Signed)
Routed to admin pool to schedule.

## 2017-03-10 NOTE — Telephone Encounter (Signed)
Spoke with Dr. [name] regarding Jesse Bowers. We will schedule Jesse Bowers for [facility] with Dr. Letta Pate and he will be scheduled to see Dr. [name] for a follow up appointment to address MRI, he verbalizes understanding.

## 2017-03-10 NOTE — Telephone Encounter (Signed)
Placed a call to Jesse Bowers to clarify second e-mail, no answer. Left message to return the call.

## 2017-03-11 NOTE — Telephone Encounter (Signed)
Patient has OV with Arnold Long, DNP 03/17/17

## 2017-03-12 ENCOUNTER — Ambulatory Visit: Payer: BLUE CROSS/BLUE SHIELD | Admitting: Psychology

## 2017-03-12 DIAGNOSIS — F4310 Post-traumatic stress disorder, unspecified: Secondary | ICD-10-CM

## 2017-03-16 NOTE — Progress Notes (Signed)
Cardiology Office Note   Date:  03/17/2017   ID:  Jesse Bowers, DOB 03/13/1965, MRN [id number]  PCP:  Concepcion Elk, MD  Cardiologist:  Irish Lack  Chief Complaint  Patient presents with  . Edema    lower extremity  . Palpitations  . Pre-op Exam    knee     History of Present Illness: Jesse Bowers is a 53 y.o. adult who presents for ongoing assessment and management of palpitations, sinus tachycardia, history of CVA on Xarelto, with other history to include obstructive sleep apnea on CPAP, panic attacks, protein C deficiency, neuropathy, and celiac disease.  The patient was seen last by Rosaria Ferries, PA, on 01/13/2017.  At that time the patient was found to be volume overloaded and decompensated.  He was advised on low-sodium diet and continue to take Lasix.  He was continued on daily weights.  He was found to be tachycardic, but he was given reassurance about this as it was not found to be substantially high.  There was consideration to start low-dose beta-blocker however his blood pressure was soft.  The patient called our office concerning lower extremity edema recurrence, he also needs clearance for upcoming surgery for left knee replacement.  The patient is asymptomatic from a cardiac standpoint.  The patient has had problems with chronic lower extremity edema.  He has not had any problems with that over the last couple of weeks.  He states that it comes and goes.  He denies any excessive salt use, he denies excessive fluid ingestion.  He is keeping his fluids to approximately 2 L a day.   Past Medical History:  Diagnosis Date  . Abnormal weight loss   . Anxiety    PTSD per patient  . Celiac disease   . Gender bias    prefers Male gender identity- "remains with vagina"  . Gluten enteropathy   . H/O parotitis    right   . H/O protein C deficiency    Dr. Anne Fu  . Hx-TIA (transient ischemic attack)    2015  . Neck pain   . Neuromuscular disorder ([place])    bilateral  neuropathy feet.  . Polycythemia, secondary   . PONV (postoperative nausea and vomiting)   . psoriatic arthritis   . Sleep apnea    cpap use- Dr. [name] follows  . Stroke [facility])    Stroke left side -slight right sided weakness-Dr. [name] follows  . Syrinx of spinal cord ([place]) 01/06/2014   c spine on MRI  . Transfusion history    past history- none recent  . Transgendered     Past Surgical History:  Procedure Laterality Date  . ABDOMINAL HYSTERECTOMY     TAH, BSO- tranverse incision.  [name] ANKLE ARTHROSCOPY WITH RECONSTRUCTION Right 2007  . CHOLECYSTECTOMY     laparoscopic  . HIP ARTHROSCOPY W/ LABRAL REPAIR Right 05/11/2013  . KNEE SURGERY Bilateral 1984   Right ACL, left PCL repair  . LIVER BIOPSY  2013   normal results.  [name] NASAL SEPTUM SURGERY N/A 09/20/2015  . THYROIDECTOMY, PARTIAL  2008  . TOTAL KNEE REVISION Left 02/06/2016   Procedure: LEFT TOTAL KNEE REVISION;  Surgeon: [name], MD;  Location: [facility];  Service: Orthopedics;  Laterality: Left;     Current Outpatient Medications  Medication Sig Dispense Refill  . baclofen (LIORESAL) 20 MG tablet Take 1 tablet (20 mg total) by mouth 3 (three) times daily. 90 tablet 3  . Calcipotriene-Betameth Diprop (ENSTILAR) 0.005-0.064 % FOAM Apply  1 application topically 2 (two) times daily.     . clindamycin (CLEOCIN) 150 MG capsule Take 300 mg by mouth as needed (DENTAL APPT). Reported on 09/14/2015    . clorazepate (TRANXENE) 15 MG tablet Take 15 mg by mouth at bedtime.    . clorazepate (TRANXENE) 7.5 MG tablet Take 7.5 mg by mouth 2 (two) times daily as needed for anxiety.    [name] desonide (DESOWEN) 0.05 % ointment APPLY TO AFFECTED AREAS TWICE DAILY AS NEEDED FOR PSORIASIS.  2  . furosemide (LASIX) 20 MG tablet Take 1 tablet (20 mg total) by mouth daily as needed for fluid or edema. 30 tablet 11  . morphine (KADIAN) 60 MG 24 hr capsule Take 1 capsule (60 mg total) by mouth every 12 (twelve) hours. 60 capsule 0  . NONFORMULARY OR  COMPOUNDED ITEM Apply 1 application topically 3 (three) times daily. 8% ketamine, 5% amitriptyline, 5% baclofen, 5% gabapentin  60 GM 1 each 5  . nortriptyline (PAMELOR) 10 MG capsule TAKE 1 TO 2 CAPSULES AT BEDTIME 60 capsule 2  . ondansetron (ZOFRAN) 4 MG tablet Take 1 tablet (4 mg total) by mouth every 8 (eight) hours as needed for nausea or vomiting. 30 tablet 0  . Oxycodone HCl 10 MG TABS Take 1 tablet (10 mg total) by mouth every 8 (eight) hours as needed. 90 tablet 0  . potassium chloride (K-DUR) 10 MEQ tablet Take 1 tablet (10 mEq total) by mouth daily as needed. Take every time you take the Lasix (furosemide). 30 tablet 11  . prazosin (MINIPRESS) 2 MG capsule Take 1 mg by mouth at bedtime. May take 2 tabs if needed    . testosterone cypionate (DEPO-TESTOSTERONE) 200 MG/ML injection Inject 0.25 mLs (50 mg total) into the muscle once a week. (Patient taking differently: Inject 100 mg into the muscle once a week. Friday) 10 mL 0  . ustekinumab (STELARA) 90 MG/ML SOSY injection Inject 90 mg into the skin as directed. q 90 days    . venlafaxine XR (EFFEXOR-XR) 150 MG 24 hr capsule Take 300 mg by mouth daily with breakfast.    . XARELTO 20 MG TABS tablet TAKE 1 TABLET DAILY WITH SUPPER 90 tablet 1   No current facility-administered medications for this visit.     Allergies:   Penicillin g; Vancomycin; Wheat extract; Duloxetine; Gabapentin; Acetaminophen; Ibuprofen; Nortriptyline; Pregabalin; Sulfa antibiotics; and Sulfacetamide sodium-sulfur    Social History:  The patient  reports that  has never smoked. he has never used smokeless tobacco. He reports that he drinks alcohol. He reports that he does not use drugs.   Family History:  The patient's family history includes Cancer in his paternal grandfather; Congestive Heart Failure in his maternal grandmother; Heart attack in his maternal grandfather, maternal grandmother, and paternal grandfather; Hypertension in his mother; Protein C  deficiency (age of onset: 93) in his sister; Psoriasis in his mother; Stroke in his maternal grandfather, maternal grandmother, and paternal uncle.    ROS: All other systems are reviewed and negative. Unless otherwise mentioned in H&P    PHYSICAL EXAM: VS:  BP 124/89   Pulse (!) 103   Ht 5' 9"  (1.753 m)   Wt 232 lb 12.8 oz (105.6 kg)   BMI 34.38 kg/m  , BMI Body mass index is 34.38 kg/m. GEN: Well nourished, well developed, in no acute distress  HEENT: normal  Neck: no JVD, carotid bruits, or masses Cardiac: RRR tachycardic,  no murmurs, rubs, or gallops,no edema  Respiratory:  clear to auscultation bilaterally, normal work of breathing GI: soft, nontender, nondistended, + BS MS: no deformity or atrophy  Skin: warm and dry, no rash Neuro: Strength in lower extremities as a result of CVA, and knees requiring surgery. Psych: euthymic mood, full affect   EKG: Normal sinus rhythm with frequent PVCs, heart rate of 103 bpm  Recent Labs: 01/07/2017: ALT 78; HGB 12.1; Platelets 213 01/13/2017: BUN 13; Creatinine, Ser 1.18; Potassium 4.5; Sodium 138    Lipid Panel No results found for: CHOL, TRIG, HDL, CHOLHDL, VLDL, LDLCALC, LDLDIRECT    Wt Readings from Last 3 Encounters:  03/17/17 232 lb 12.8 oz (105.6 kg)  01/13/17 241 lb (109.3 kg)  12/03/16 242 lb (109.8 kg)      Other studies Reviewed: Echocardiogram 12-23-2016  Left ventricle: The cavity size was normal. Wall thickness was   increased in a pattern of mild LVH. Systolic function was normal.   The estimated ejection fraction was in the range of 60% to 65%.   Wall motion was normal; there were no regional wall motion   abnormalities. Left ventricular diastolic function parameters   were normal. - Mitral valve: There was mild regurgitation. - Left atrium: The atrium was mildly dilated. - Atrial septum: No defect or patent foramen ovale was identified.  ASSESSMENT AND PLAN:  1.  Tachycardia: This appears to be  baseline for him.  He does not appear dehydrated, he is continued to have chronic pain which may be contributing.  We will go ahead and order labs for evaluation of his kidney function on Lasix definitive evaluation of kidney function.  Would not add beta-blocker perioperatively.  He is also requesting a cardiologist in the [employer] office as going to [place] [employer] was a little too far for him to go with his disability.  He will be referred to the DOD currently Dr. [name].  Not been seen by cardiologist for 2 years.  And has been followed by Rosaria Ferries.  PA.  2.  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  He has been taking Lasix but does not feel that he is working as well as it has been in the past.  Can consider changing to torsemide however he is not having to take it daily and therefore we will continue him on his current medication regimen.  Reviewed his echocardiogram which does not reveal reduced EF or diastolic dysfunction at this time.  We will check a CBC to evaluate for anemia.  At this time he is not having any evidence of lower extremity edema.  His weight is down to 232 pounds which appears to be his dry weight.  He is advised to continue lower clearing his salt intake if at all possible.  He is often sitting in a wheelchair and does have some dependent edema at times.  This may be also contributing along with his liver disease.  4.  Evaluation for the left knee repair: He is stable from a cardiac standpoint    Chart reviewed as part of pre-operative protocol coverage. Patient was contacted 03/17/2017 in reference to pre-operative risk assessment for pending surgery as outlined below.  Jesse Bowers was last seen on 03/17/2017 by Jory Sims DNP.  Since that day, Jesse Bowers has done well, with no new cardiac complaints  Due to new or worsening symptoms, Colleen Donahoe will not require a follow-up visit  for further pre-operative risk assessment.  Pre-op covering  staff: - Please schedule appointment and call patient to inform them. - Please contact requesting surgeon's office via preferred method (i.e, phone, fax) to inform Dr. Reynaldo Minium the patient is clear for surgery.   Relative will need to be held for 48 hours prior to procedure start as soon as possible post procedure.  Current medicines are reviewed at length with the patient today.    Labs/ tests ordered today include: CMET,  CBC, lipids, and TSH.  Phill Myron. [place] Pugh, ANP, AACC   03/17/2017 1:00 PM    [facility] [address] [place], [place] [address] Phone: [phone]; Fax: [phone]

## 2017-03-17 ENCOUNTER — Ambulatory Visit: Payer: BLUE CROSS/BLUE SHIELD | Admitting: Psychology

## 2017-03-17 ENCOUNTER — Encounter: Payer: Self-pay | Admitting: Adult Health

## 2017-03-17 ENCOUNTER — Ambulatory Visit: Payer: BLUE CROSS/BLUE SHIELD | Admitting: Adult Health

## 2017-03-17 VITALS — BP 124/89 | HR 103 | Ht 69.0 in | Wt 232.8 lb

## 2017-03-17 DIAGNOSIS — R600 Localized edema: Secondary | ICD-10-CM

## 2017-03-17 DIAGNOSIS — Z79899 Other long term (current) drug therapy: Secondary | ICD-10-CM

## 2017-03-17 DIAGNOSIS — F4312 Post-traumatic stress disorder, chronic: Secondary | ICD-10-CM

## 2017-03-17 DIAGNOSIS — R000 Tachycardia, unspecified: Secondary | ICD-10-CM

## 2017-03-17 DIAGNOSIS — E7800 Pure hypercholesterolemia, unspecified: Secondary | ICD-10-CM | POA: Diagnosis not present

## 2017-03-17 NOTE — Patient Instructions (Signed)
Medication Instructions:  NO CHANGES-Your physician recommends that you continue on your current medications as directed. Please refer to the Current Medication list given to you today.  If you need a refill on your cardiac medications before your next appointment, please call your pharmacy.  Labwork: CMET, CBC, MAG AND TSH TODAY HERE IN OUR OFFICE AT [facility]  Special Instructions: ACCEPTABLE RISK FOR SCHEDULED SURGERY  Follow-Up: Your physician wants you to follow-up in: [place].   Thank you for choosing [facility] at [facility]!!

## 2017-03-18 LAB — COMPREHENSIVE METABOLIC PANEL
A/G RATIO: 1.9 (ref 1.2–2.2)
ALT: 73 IU/L — AB (ref 0–44)
AST: 79 IU/L — AB (ref 0–40)
Albumin: 4.7 g/dL (ref 3.5–5.5)
Alkaline Phosphatase: 462 IU/L — ABNORMAL HIGH (ref 39–117)
BUN/Creatinine Ratio: 16 (ref 9–20)
BUN: 19 mg/dL (ref 6–24)
Bilirubin Total: 0.5 mg/dL (ref 0.0–1.2)
CALCIUM: 8.8 mg/dL (ref 8.7–10.2)
CHLORIDE: 101 mmol/L (ref 96–106)
CO2: 21 mmol/L (ref 20–29)
Creatinine, Ser: 1.16 mg/dL (ref 0.76–1.27)
GFR calc Af Amer: 84 mL/min/{1.73_m2} (ref >59)
GFR calc non Af Amer: 73 mL/min/{1.73_m2} (ref >59)
Globulin, Total: 2.5 g/dL (ref 1.5–4.5)
Glucose: 142 mg/dL — ABNORMAL HIGH (ref 65–99)
POTASSIUM: 4.3 mmol/L (ref 3.5–5.2)
Sodium: 138 mmol/L (ref 134–144)
Total Protein: 7.2 g/dL (ref 6.0–8.5)

## 2017-03-18 LAB — CBC
HEMOGLOBIN: 12.1 g/dL — AB (ref 13.0–17.7)
Hematocrit: 38.5 % (ref 37.5–51.0)
MCH: 23 pg — ABNORMAL LOW (ref 26.6–33.0)
MCHC: 31.4 g/dL — ABNORMAL LOW (ref 31.5–35.7)
MCV: 73 fL — ABNORMAL LOW (ref 79–97)
Platelets: 197 10*3/uL (ref 150–379)
RBC: 5.25 x10E6/uL (ref 4.14–5.80)
RDW: 16.3 % — ABNORMAL HIGH (ref 12.3–15.4)
WBC: 3.7 10*3/uL (ref 3.4–10.8)

## 2017-03-18 LAB — MAGNESIUM: MAGNESIUM: 2.2 mg/dL (ref 1.6–2.3)

## 2017-03-18 LAB — TSH: TSH: 4.98 u[IU]/mL — ABNORMAL HIGH (ref 0.450–4.500)

## 2017-03-19 ENCOUNTER — Ambulatory Visit (INDEPENDENT_AMBULATORY_CARE_PROVIDER_SITE_OTHER): Payer: BLUE CROSS/BLUE SHIELD | Admitting: Psychology

## 2017-03-19 DIAGNOSIS — F4310 Post-traumatic stress disorder, unspecified: Secondary | ICD-10-CM

## 2017-03-20 ENCOUNTER — Telehealth: Payer: Self-pay | Admitting: Cardiovascular Disease

## 2017-03-20 NOTE — Telephone Encounter (Signed)
New message ° °Pt verbalized that he is returning call for RN °

## 2017-03-20 NOTE — Telephone Encounter (Signed)
Spoke with pt, aware of lab results. He reports DNP Jesse Bowers was going to decide about switching him to torsemide based on this lab work. Aware she is out of the office today, will forward for her review and we will call back next week. Pt agreed with this plan.

## 2017-03-22 NOTE — Telephone Encounter (Signed)
No plans to change furosemide to torsemide now that he is not presently retaining fluid.

## 2017-03-23 NOTE — Telephone Encounter (Signed)
Sent [facility] message notify

## 2017-03-24 ENCOUNTER — Ambulatory Visit: Payer: Self-pay | Admitting: Registered Nurse

## 2017-03-24 ENCOUNTER — Ambulatory Visit: Payer: BLUE CROSS/BLUE SHIELD | Admitting: Psychology

## 2017-03-24 ENCOUNTER — Ambulatory Visit: Payer: Self-pay | Admitting: Physical Medicine & Rehabilitation

## 2017-03-24 ENCOUNTER — Encounter: Payer: Self-pay | Admitting: Registered Nurse

## 2017-03-24 ENCOUNTER — Encounter: Payer: BLUE CROSS/BLUE SHIELD | Attending: Physical Medicine & Rehabilitation | Admitting: Registered Nurse

## 2017-03-24 VITALS — BP 108/77 | HR 97

## 2017-03-24 DIAGNOSIS — F418 Other specified anxiety disorders: Secondary | ICD-10-CM | POA: Diagnosis not present

## 2017-03-24 DIAGNOSIS — Z96652 Presence of left artificial knee joint: Secondary | ICD-10-CM

## 2017-03-24 DIAGNOSIS — M7062 Trochanteric bursitis, left hip: Secondary | ICD-10-CM

## 2017-03-24 DIAGNOSIS — G8929 Other chronic pain: Secondary | ICD-10-CM | POA: Insufficient documentation

## 2017-03-24 DIAGNOSIS — Z87890 Personal history of sex reassignment: Secondary | ICD-10-CM | POA: Diagnosis not present

## 2017-03-24 DIAGNOSIS — D751 Secondary polycythemia: Secondary | ICD-10-CM | POA: Diagnosis not present

## 2017-03-24 DIAGNOSIS — K900 Celiac disease: Secondary | ICD-10-CM | POA: Diagnosis not present

## 2017-03-24 DIAGNOSIS — M545 Low back pain, unspecified: Secondary | ICD-10-CM

## 2017-03-24 DIAGNOSIS — R209 Unspecified disturbances of skin sensation: Secondary | ICD-10-CM | POA: Insufficient documentation

## 2017-03-24 DIAGNOSIS — Z9181 History of falling: Secondary | ICD-10-CM | POA: Insufficient documentation

## 2017-03-24 DIAGNOSIS — S43002A Unspecified subluxation of left shoulder joint, initial encounter: Secondary | ICD-10-CM | POA: Diagnosis not present

## 2017-03-24 DIAGNOSIS — Z7901 Long term (current) use of anticoagulants: Secondary | ICD-10-CM | POA: Diagnosis not present

## 2017-03-24 DIAGNOSIS — I69351 Hemiplegia and hemiparesis following cerebral infarction affecting right dominant side: Secondary | ICD-10-CM | POA: Diagnosis not present

## 2017-03-24 DIAGNOSIS — T84018S Broken internal joint prosthesis, other site, sequela: Secondary | ICD-10-CM | POA: Diagnosis not present

## 2017-03-24 DIAGNOSIS — G894 Chronic pain syndrome: Secondary | ICD-10-CM

## 2017-03-24 DIAGNOSIS — Z79891 Long term (current) use of opiate analgesic: Secondary | ICD-10-CM | POA: Insufficient documentation

## 2017-03-24 DIAGNOSIS — M13862 Other specified arthritis, left knee: Secondary | ICD-10-CM | POA: Insufficient documentation

## 2017-03-24 DIAGNOSIS — L4050 Arthropathic psoriasis, unspecified: Secondary | ICD-10-CM | POA: Insufficient documentation

## 2017-03-24 DIAGNOSIS — Z79899 Other long term (current) drug therapy: Secondary | ICD-10-CM | POA: Diagnosis not present

## 2017-03-24 DIAGNOSIS — Z96659 Presence of unspecified artificial knee joint: Secondary | ICD-10-CM

## 2017-03-24 DIAGNOSIS — M542 Cervicalgia: Secondary | ICD-10-CM | POA: Diagnosis not present

## 2017-03-24 DIAGNOSIS — Z5181 Encounter for therapeutic drug level monitoring: Secondary | ICD-10-CM

## 2017-03-24 DIAGNOSIS — I69320 Aphasia following cerebral infarction: Secondary | ICD-10-CM | POA: Insufficient documentation

## 2017-03-24 DIAGNOSIS — M7522 Bicipital tendinitis, left shoulder: Secondary | ICD-10-CM | POA: Diagnosis not present

## 2017-03-24 DIAGNOSIS — Z86718 Personal history of other venous thrombosis and embolism: Secondary | ICD-10-CM | POA: Diagnosis not present

## 2017-03-24 DIAGNOSIS — D6859 Other primary thrombophilia: Secondary | ICD-10-CM | POA: Diagnosis not present

## 2017-03-24 DIAGNOSIS — I69398 Other sequelae of cerebral infarction: Secondary | ICD-10-CM | POA: Diagnosis not present

## 2017-03-24 MED ORDER — OXYCODONE HCL 10 MG PO TABS: 10.0000 mg | ORAL_TABLET | Freq: Three times a day (TID) | ORAL | 0 refills | Status: DC | PRN

## 2017-03-24 MED ORDER — MORPHINE SULFATE ER 60 MG PO CP24: 60.0000 mg | ORAL_CAPSULE | Freq: Two times a day (BID) | ORAL | 0 refills | Status: DC

## 2017-03-24 NOTE — Progress Notes (Signed)
Subjective:    Patient ID: Jesse Bowers, adult    DOB: [birth year], 52 y.o.   MRN: [id number]  HPI: Jesse Bowers is a 52year old male who returns for follow-up appointment for chronic pain and medication refill. He states his pain is located in his neck, lower back , left hip, left knee and bilateral feet. He rates his pain 9. He's scheduled for left knee revision on April 22, 2017 with Dr. Wynelle Link. His current exercise regime iswalking with his cane short distances in his home and performing stretching exercises.  Jesse Bowers s/p Left Shoulder Surgery with Dr. Onnie Graham on 11/14/2016.  Jesse Bowers Morphine equivalent is 170.50 MME. He  is also prescribed Clorazepate by Dr. Erling Cruz. We have discussed the black box warning again regarding using opioids and benzodiazepines. He is being closely monitored and under the care of his psychiatrist Dr. Erling Cruz.  I highlighted the dangers of using these drugs together and discussed the adverse events including respiratory suppression, overdose, cognitive impairment and importance of  compliance with current regimen. He verbalizes understanding, we will continue to monitor and adjust as indicated.    Last UDS was performed on 08/08/2016, it was consistent.   S/PLeft Total Knee Revision on 02/06/2016 by Dr. Wynelle Link  Pain Inventory Average Pain 8 Pain Right Now 9 My pain is constant, sharp and aching  In the last 24 hours, has pain interfered with the following? General activity 9 Relation with others 10 Enjoyment of life 10 What TIME of day is your pain at its worst? daytime and night Sleep (in general) Poor  Pain is worse with: walking and bending Pain improves with: medication and injections Relief from Meds: 8  Mobility walk with assistance use a walker ability to climb steps?  yes do you drive?  yes use a wheelchair transfers alone  Function disabled: date disabled 10/2012 I need assistance with the following:  feeding, dressing,  bathing, meal prep, household duties and shopping Do you have any goals in this area?  no  Neuro/Psych bladder control problems weakness numbness tremor trouble walking spasms anxiety  Prior Studies Any changes since last visit?  no  Physicians involved in your care Any changes since last visit?  no   Family History  Problem Relation Age of Onset  . Stroke Maternal Grandfather        46  . Heart attack Maternal Grandfather   . Hypertension Mother   . Psoriasis Mother   . Cancer Paternal Grandfather   . Heart attack Paternal Grandfather   . Stroke Paternal Uncle        age 46  . Stroke Maternal Grandmother   . Congestive Heart Failure Maternal Grandmother   . Heart attack Maternal Grandmother   . Protein C deficiency Sister 33       Miscarriages   Social History   Socioeconomic History  . Marital status: Married    Spouse name: None  . Number of children: 2  . Years of education: 4y college  . Highest education level: None  Social Needs  . Financial resource strain: None  . Food insecurity - worry: None  . Food insecurity - inability: None  . Transportation needs - medical: None  . Transportation needs - non-medical: None  Occupational History  . Occupation: Pediatric Nurse practitioner    Comment: Not working since [place] 2015  Tobacco Use  . Smoking status: Never Smoker  . Smokeless tobacco: Never Used  Substance and Sexual Activity  .  Alcohol use: Yes    Comment: wine once a month  . Drug use: No  . Sexual activity: Yes    Birth control/protection: None    Comment: patient is a transgender on testosterone shots, no biological kids  Other Topics Concern  . None  Social History Narrative   Education 4 year college, former Therapist, sports X 15 years, pediatric nurse practitioner x 6 years, did NP degree from [employer] [place]. Relocated to [place] about 2 months ago from [place]. Patient was in [place] for last 4 years and prior to that in [place]. His wife  is working as Scientist, research (physical sciences) for [employer]. Patient is not working and applying for disability. They have 2 kids but no biologic children.    Past Surgical History:  Procedure Laterality Date  . ABDOMINAL HYSTERECTOMY     TAH, BSO- tranverse incision.  [name] ANKLE ARTHROSCOPY WITH RECONSTRUCTION Right 2007  . CHOLECYSTECTOMY     laparoscopic  . HIP ARTHROSCOPY W/ LABRAL REPAIR Right 05/11/2013  . KNEE SURGERY Bilateral 1984   Right ACL, left PCL repair  . LIVER BIOPSY  2013   normal results.  [name] NASAL SEPTUM SURGERY N/A 09/20/2015  . THYROIDECTOMY, PARTIAL  2008  . TOTAL KNEE REVISION Left 02/06/2016   Procedure: LEFT TOTAL KNEE REVISION;  Surgeon: [name], MD;  Location: [facility];  Service: Orthopedics;  Laterality: Left;   Past Medical History:  Diagnosis Date  . Abnormal weight loss   . Anxiety    PTSD per patient  . Celiac disease   . Gender bias    prefers Male gender identity- "remains with vagina"  . Gluten enteropathy   . H/O parotitis    right   . H/O protein C deficiency    Dr. Anne Fu  . Hx-TIA (transient ischemic attack)    2015  . Neck pain   . Neuromuscular disorder ([place])    bilateral neuropathy feet.  . Polycythemia, secondary   . PONV (postoperative nausea and vomiting)   . psoriatic arthritis   . Sleep apnea    cpap use- Dr. [name] follows  . Stroke [facility])    Stroke left side -slight right sided weakness-Dr. [name] follows  . Syrinx of spinal cord ([place]) 01/06/2014   c spine on MRI  . Transfusion history    past history- none recent  . Transgendered    BP 108/77   Pulse 97   SpO2 98%   Opioid Risk Score:  0 Fall Risk Score:  `1  Depression screen PHQ 2/9  Depression screen [facility] 2/9 02/18/2017 11/10/2016 09/15/2016  Decreased Interest 1 0 0  Down, Depressed, Hopeless 1 0 0  PHQ - 2 Score 2 0 0  Some recent data might be hidden      Review of Systems  Constitutional: Positive for unexpected weight change.  HENT: Negative.   Eyes:  Negative.   Respiratory: Negative.   Cardiovascular: Positive for leg swelling.  Gastrointestinal: Negative.   Endocrine: Negative.   Genitourinary:       Bladder control  Musculoskeletal: Positive for gait problem.       Spasms  Skin: Negative.   Allergic/Immunologic: Negative.   Neurological: Positive for tremors, weakness and numbness.       Tingling  Hematological: Negative.   Psychiatric/Behavioral: The patient is nervous/anxious.   All other systems reviewed and are negative.      Objective:   Physical Exam  Constitutional: He is oriented to person, place, and time.  He appears well-developed and well-nourished.  HENT:  Head: Normocephalic and atraumatic.  Neck: Normal range of motion. Neck supple.  Cervical Paraspinal Tenderness: C-2-C-3  Cardiovascular: Normal rate and regular rhythm.  Pulmonary/Chest: Effort normal and breath sounds normal.  Musculoskeletal:  Normal Muscle Bulk and Muscle Testing Reveals: Upper Extremities: Full ROM and Muscle Strength on the Right 4/5 and Left  4/5 Thoracic Paraspinal Tenderness: T-1-T-3 Lumbar Paraspinal Tenderness: L-3-L-5 Lower Extremities: Right: Full ROM and Muscle Strength 5/5 Right Lower Extremity Flexion Produces Pain into Patella Left: Decreased ROM and Muscle Strength 4/5 Arrived in wheelchair  Neurological: He is alert and oriented to person, place, and time.  Skin: Skin is warm and dry.  Psychiatric: He has a normal mood and affect.  Nursing note and vitals reviewed.         Assessment & Plan:  1. Psoriatic arthritis with pain in multiple areas, most prominently feet, hands, elbows. Refilled:Kadian 60 mg 24 hr. Capsule, one capsule every 12 hours #60 and Oxycodone 10 mg one tablet every 8 hours as needed #90. 03/24/2017 We will continue the opioid monitoring program, this consists of regular clinic visits, examinations, urine drug screen, pill counts as well as use of [place] Controlled Substance  Reporting System.  Rheumatology Following. 02/18/2017 2. Chronic Pain Syndrome: Continue Compound Cream. 03/24/2017 3. 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 Patello-femoral arthritis left knee: Continue to monitor. 03/24/2017 S/P TKR on 07/03/14: Ortho Following: Dr. Wynelle Link perform Total Left Knee Revision on 02/06/2016. Scheduled for Left Knee Revision on 04/22/17.  03/24/2017 5. Chronic mid- low back pain: Continue current medication regime, and encourage to increase activity as tolerated.03/24/2017 6. Polycythemia: Oncology Following. 03/24/2017 7. Depression with anxiety : Continue to Monitor. 03/24/2017 8. Cervicalgia: Continue HEP as tolerated  and Continue to Monitor. Continue current medication regime. 03/24/2017 9. Muscle Spasm: Continue Flexeril. 03/24/2017 10. Bilateral Ankle Pain: No complaints today.  Continue to Monitor. 03/24/2017  10. Left lateral epicondylitis: No complaints today.Ortho Following. 03/24/2017 11. Left Shoulder Pain: No complaints today. Ortho Following. 03/24/2017 12. Lumbar Radiculitis: S/P ESI with Dr. Letta Pate. 10/06/2016, good relief noted.03/24/2017 13. Left: Greater Trochanteric Bursitis: Continue with Ice and Heat Therapy: Contiue HEP as Tolerated. Continue to Monitor. 03/24/2017.  20 minutes of face to face patient care time was spent during this visit. All questions were encouraged and answered.  F/U in 1 month

## 2017-03-26 ENCOUNTER — Ambulatory Visit: Payer: BLUE CROSS/BLUE SHIELD | Admitting: Psychology

## 2017-03-26 DIAGNOSIS — F4310 Post-traumatic stress disorder, unspecified: Secondary | ICD-10-CM

## 2017-03-28 LAB — DRUG TOX MONITOR 1 W/CONF, ORAL FLD
ALPRAZOLAM: NEGATIVE ng/mL (ref <0.50)
Amphetamines: NEGATIVE ng/mL (ref <10)
BENZODIAZEPINES: POSITIVE ng/mL — AB (ref <0.50)
BUPRENORPHINE: NEGATIVE ng/mL (ref <0.10)
Barbiturates: NEGATIVE ng/mL (ref <10)
Buprenorphine: NEGATIVE ng/mL (ref <0.10)
COCAINE: NEGATIVE ng/mL (ref <5.0)
CODEINE: 2.6 ng/mL — AB (ref <2.5)
Chlordiazepoxide: NEGATIVE ng/mL (ref <0.50)
Clonazepam: NEGATIVE ng/mL (ref <0.50)
DIAZEPAM: NEGATIVE ng/mL (ref <0.50)
DIHYDROCODEINE: NEGATIVE ng/mL (ref <2.5)
FENTANYL: NEGATIVE ng/mL (ref <0.10)
Flunitrazepam: NEGATIVE ng/mL (ref <0.50)
Flurazepam: NEGATIVE ng/mL (ref <0.50)
HYDROCODONE: NEGATIVE ng/mL (ref <2.5)
Heroin Metabolite: NEGATIVE ng/mL (ref <1.0)
Hydromorphone: NEGATIVE ng/mL (ref <2.5)
LORAZEPAM: NEGATIVE ng/mL (ref <0.50)
MARIJUANA: NEGATIVE ng/mL (ref <2.5)
MDMA: NEGATIVE ng/mL (ref <10)
MIDAZOLAM: NEGATIVE ng/mL (ref <0.50)
Meprobamate: NEGATIVE ng/mL (ref <2.5)
Methadone: NEGATIVE ng/mL (ref <5.0)
Morphine: 81 ng/mL — ABNORMAL HIGH (ref <2.5)
NALOXONE: NEGATIVE ng/mL (ref <0.25)
NICOTINE METABOLITE: NEGATIVE ng/mL (ref <5.0)
NORBUPRENORPHINE: NEGATIVE ng/mL (ref <0.50)
NORHYDROCODONE: NEGATIVE ng/mL (ref <2.5)
Nordiazepam: 15.59 ng/mL — ABNORMAL HIGH (ref <0.50)
Noroxycodone: 66.9 ng/mL — ABNORMAL HIGH (ref <2.5)
OPIATES: POSITIVE ng/mL — AB (ref <2.5)
OXYMORPHONE: 4 ng/mL — AB (ref <2.5)
Oxazepam: 0.98 ng/mL — ABNORMAL HIGH (ref <0.50)
Oxycodone: 228.4 ng/mL — ABNORMAL HIGH (ref <2.5)
PHENCYCLIDINE: NEGATIVE ng/mL (ref <10)
TAPENTADOL: NEGATIVE ng/mL (ref <5.0)
Temazepam: NEGATIVE ng/mL (ref <0.50)
Triazolam: NEGATIVE ng/mL (ref <0.50)
Zolpidem: NEGATIVE ng/mL (ref <5.0)

## 2017-03-28 LAB — DRUG TOX ALC METAB W/CON, ORAL FLD: Alcohol Metabolite: NEGATIVE ng/mL (ref <25)

## 2017-03-31 ENCOUNTER — Telehealth: Payer: Self-pay | Admitting: *Deleted

## 2017-03-31 ENCOUNTER — Ambulatory Visit (INDEPENDENT_AMBULATORY_CARE_PROVIDER_SITE_OTHER): Payer: BLUE CROSS/BLUE SHIELD | Admitting: Psychology

## 2017-03-31 DIAGNOSIS — F4312 Post-traumatic stress disorder, chronic: Secondary | ICD-10-CM | POA: Diagnosis not present

## 2017-03-31 NOTE — Telephone Encounter (Signed)
Oral swab drug screen was consistent for prescribed medications. He takes Librium and it metabolizes to nordiazepam and oxazepam.

## 2017-04-02 ENCOUNTER — Ambulatory Visit (INDEPENDENT_AMBULATORY_CARE_PROVIDER_SITE_OTHER): Payer: BLUE CROSS/BLUE SHIELD | Admitting: Psychology

## 2017-04-02 DIAGNOSIS — F4310 Post-traumatic stress disorder, unspecified: Secondary | ICD-10-CM

## 2017-04-03 ENCOUNTER — Other Ambulatory Visit: Payer: Self-pay | Admitting: *Deleted

## 2017-04-03 DIAGNOSIS — L4050 Arthropathic psoriasis, unspecified: Secondary | ICD-10-CM

## 2017-04-03 MED ORDER — NONFORMULARY OR COMPOUNDED ITEM: 1.0000 "application " | Freq: Three times a day (TID) | 5 refills | Status: DC

## 2017-04-07 ENCOUNTER — Ambulatory Visit (INDEPENDENT_AMBULATORY_CARE_PROVIDER_SITE_OTHER): Payer: BLUE CROSS/BLUE SHIELD | Admitting: Psychology

## 2017-04-07 DIAGNOSIS — F4312 Post-traumatic stress disorder, chronic: Secondary | ICD-10-CM

## 2017-04-08 ENCOUNTER — Other Ambulatory Visit: Payer: Self-pay | Admitting: Hematology

## 2017-04-09 ENCOUNTER — Ambulatory Visit: Payer: BLUE CROSS/BLUE SHIELD | Admitting: Psychology

## 2017-04-09 ENCOUNTER — Inpatient Hospital Stay: Payer: BLUE CROSS/BLUE SHIELD | Attending: Hematology

## 2017-04-09 DIAGNOSIS — F4310 Post-traumatic stress disorder, unspecified: Secondary | ICD-10-CM | POA: Diagnosis not present

## 2017-04-09 DIAGNOSIS — D751 Secondary polycythemia: Secondary | ICD-10-CM

## 2017-04-09 LAB — CBC WITH DIFFERENTIAL/PLATELET
BASOS ABS: 0 10*3/uL (ref 0.0–0.1)
Basophils Relative: 0 %
Eosinophils Absolute: 0 10*3/uL (ref 0.0–0.5)
Eosinophils Relative: 0 %
HCT: 39.1 % (ref 38.4–49.9)
HEMOGLOBIN: 11.8 g/dL — AB (ref 13.0–17.1)
LYMPHS ABS: 1 10*3/uL (ref 0.9–3.3)
LYMPHS PCT: 27 %
MCH: 23.3 pg — AB (ref 27.2–33.4)
MCHC: 30.2 g/dL — ABNORMAL LOW (ref 32.0–36.0)
MCV: 77.1 fL — AB (ref 79.3–98.0)
Monocytes Absolute: 0.2 10*3/uL (ref 0.1–0.9)
Monocytes Relative: 6 %
NEUTROS PCT: 67 %
Neutro Abs: 2.4 10*3/uL (ref 1.5–6.5)
PLATELETS: 154 10*3/uL (ref 140–400)
RBC: 5.07 MIL/uL (ref 4.20–5.82)
RDW: 16.9 % — ABNORMAL HIGH (ref 11.0–14.6)
WBC: 3.6 10*3/uL — AB (ref 4.0–10.3)

## 2017-04-10 ENCOUNTER — Ambulatory Visit: Payer: BLUE CROSS/BLUE SHIELD | Admitting: Physical Medicine & Rehabilitation

## 2017-04-13 ENCOUNTER — Ambulatory Visit: Payer: Self-pay | Admitting: Physical Medicine & Rehabilitation

## 2017-04-14 ENCOUNTER — Ambulatory Visit: Payer: Self-pay | Admitting: Psychology

## 2017-04-15 ENCOUNTER — Other Ambulatory Visit: Payer: Self-pay

## 2017-04-15 ENCOUNTER — Encounter (HOSPITAL_COMMUNITY): Payer: Self-pay | Admitting: *Deleted

## 2017-04-16 ENCOUNTER — Ambulatory Visit: Payer: BLUE CROSS/BLUE SHIELD | Admitting: Psychology

## 2017-04-20 ENCOUNTER — Ambulatory Visit: Payer: Self-pay | Admitting: Registered Nurse

## 2017-04-20 ENCOUNTER — Encounter: Payer: Self-pay | Admitting: Physical Medicine & Rehabilitation

## 2017-04-20 ENCOUNTER — Encounter: Payer: BLUE CROSS/BLUE SHIELD | Attending: Physical Medicine & Rehabilitation

## 2017-04-20 ENCOUNTER — Ambulatory Visit: Payer: BLUE CROSS/BLUE SHIELD | Admitting: Physical Medicine & Rehabilitation

## 2017-04-20 DIAGNOSIS — I69351 Hemiplegia and hemiparesis following cerebral infarction affecting right dominant side: Secondary | ICD-10-CM | POA: Diagnosis not present

## 2017-04-20 DIAGNOSIS — L4050 Arthropathic psoriasis, unspecified: Secondary | ICD-10-CM | POA: Insufficient documentation

## 2017-04-20 DIAGNOSIS — S43002A Unspecified subluxation of left shoulder joint, initial encounter: Secondary | ICD-10-CM | POA: Insufficient documentation

## 2017-04-20 DIAGNOSIS — K900 Celiac disease: Secondary | ICD-10-CM | POA: Diagnosis not present

## 2017-04-20 DIAGNOSIS — M13862 Other specified arthritis, left knee: Secondary | ICD-10-CM | POA: Diagnosis not present

## 2017-04-20 DIAGNOSIS — M545 Low back pain: Secondary | ICD-10-CM | POA: Insufficient documentation

## 2017-04-20 DIAGNOSIS — Z87890 Personal history of sex reassignment: Secondary | ICD-10-CM | POA: Diagnosis not present

## 2017-04-20 DIAGNOSIS — G8929 Other chronic pain: Secondary | ICD-10-CM | POA: Diagnosis present

## 2017-04-20 DIAGNOSIS — Z79899 Other long term (current) drug therapy: Secondary | ICD-10-CM | POA: Diagnosis not present

## 2017-04-20 DIAGNOSIS — Z9181 History of falling: Secondary | ICD-10-CM | POA: Insufficient documentation

## 2017-04-20 DIAGNOSIS — Z86718 Personal history of other venous thrombosis and embolism: Secondary | ICD-10-CM | POA: Insufficient documentation

## 2017-04-20 DIAGNOSIS — D751 Secondary polycythemia: Secondary | ICD-10-CM | POA: Insufficient documentation

## 2017-04-20 DIAGNOSIS — M5416 Radiculopathy, lumbar region: Secondary | ICD-10-CM | POA: Diagnosis not present

## 2017-04-20 DIAGNOSIS — I69320 Aphasia following cerebral infarction: Secondary | ICD-10-CM | POA: Diagnosis not present

## 2017-04-20 DIAGNOSIS — Z79891 Long term (current) use of opiate analgesic: Secondary | ICD-10-CM | POA: Insufficient documentation

## 2017-04-20 DIAGNOSIS — Z7901 Long term (current) use of anticoagulants: Secondary | ICD-10-CM | POA: Diagnosis not present

## 2017-04-20 DIAGNOSIS — F418 Other specified anxiety disorders: Secondary | ICD-10-CM | POA: Diagnosis not present

## 2017-04-20 DIAGNOSIS — R209 Unspecified disturbances of skin sensation: Secondary | ICD-10-CM | POA: Insufficient documentation

## 2017-04-20 DIAGNOSIS — D6859 Other primary thrombophilia: Secondary | ICD-10-CM | POA: Insufficient documentation

## 2017-04-20 DIAGNOSIS — M7522 Bicipital tendinitis, left shoulder: Secondary | ICD-10-CM | POA: Diagnosis not present

## 2017-04-20 DIAGNOSIS — I69398 Other sequelae of cerebral infarction: Secondary | ICD-10-CM | POA: Insufficient documentation

## 2017-04-20 MED ORDER — MORPHINE SULFATE ER 60 MG PO CP24: 60.0000 mg | ORAL_CAPSULE | Freq: Two times a day (BID) | ORAL | 0 refills | Status: DC

## 2017-04-20 MED ORDER — OXYCODONE HCL 10 MG PO TABS: 10.0000 mg | ORAL_TABLET | Freq: Three times a day (TID) | ORAL | 0 refills | Status: DC | PRN

## 2017-04-20 NOTE — Progress Notes (Signed)
LEFT L5-S1Lumbar transforaminal epidural steroid injection under fluoroscopic guidance  Indication: Lumbosacral radiculitis is not relieved by medication management or other conservative care and interfering with self-care and mobility. Pt stopped Xarelto 2/14  Informed consent was obtained after describing risk and benefits of the procedure with the patient, this includes bleeding, bruising, infection, paralysis and medication side effects.  The patient wishes to proceed and has given written consent.  Patient was placed in prone position.  The lumbar area was marked and prepped with Betadine.  It was entered with a 25-gauge 1-1/2 inch needle and one mL of 1% lidocaine was injected into the skin and subcutaneous tissue.  Then a 22-gauge 5 spinal needle was inserted into the Left L5-S1 intervertebral foramen under AP, lateral, and oblique view.  Then a solution containing one mL of 10 mg per mL dexamethasone and 2 mL of 1% lidocaine was injected.  The patient tolerated procedure well.  Post procedure instructions were given.  Please see post procedure form.  Indication for chronic opioid: LUMBAR DISCOGENIC PAIN Medication and dose: KADIAN 60MG BID,OXYCODONE 10MG # pills per month: 60 AND 90 RESPECTIVELY Last UDS date: 03/24/2017 Pain contract signed (Y/N): Y Date narcotic database last reviewed (include red flags): 04/20/2017, CHRONIC STABLE BENZO DOSE PER PCP  Resume Xarelto in am

## 2017-04-20 NOTE — Patient Instructions (Signed)

## 2017-04-20 NOTE — Progress Notes (Signed)
  PROCEDURE RECORD [facility]   Name: Kinsey Cowsert DOB:[birth year] MRN: [id number]  Date:04/20/2017  Physician: Alysia Penna, MD    Nurse/CMA: Bright CMA  Allergies:  Allergies  Allergen Reactions  . Penicillin G Anaphylaxis and Other (See Comments)    Has patient had a PCN reaction causing immediate rash, facial/tongue/throat swelling, SOB or lightheadedness with hypotension: Yes Has patient had a PCN reaction causing severe rash involving mucus membranes or skin necrosis: No Has patient had a PCN reaction that required hospitalization Yes Has patient had a PCN reaction occurring within the last 10 years: No If all of the above answers are "NO", then may proceed with Cephalosporin use.   . Vancomycin Rash and Other (See Comments)    RED MAN SYNDROME CAN HAVE IF GIVEN OVER 2HOURS  . Duloxetine Other (See Comments)    Restless legs  . Gabapentin Nausea Only and Other (See Comments)    Other reaction(s): nausea, mental status  . Acetaminophen Other (See Comments)    Elevates liver enzymes.   . Ibuprofen Other (See Comments)    Contraindicated with Xarelto.   . Nortriptyline Other (See Comments)    Dry mouth at 25 mg dose.  Tolerates 10 mg dose  . Pregabalin Other (See Comments)    Ineffective  . Sulfa Antibiotics Rash and Other (See Comments)    Stevens-Johnson rash  . Sulfacetamide Sodium-Sulfur Rash    Consent Signed: Yes.    Is patient diabetic? No.  CBG today? NA  Pregnant: No. LMP: No LMP recorded. (age [age])  Anticoagulants: yes (xarelto last dose 4 days ago) Anti-inflammatory: no Antibiotics: no  Procedure: Left L5-S1 Transforminal Position: Prone   Start Time: 2:11pm   End Time: 2:14pm  Fluoro Time: 25s  RN/CMA Bright CMA Bright CMA    Time 1:48pm 2:22pm    BP 131/84 115/76    Pulse 96 98    Respirations 16 16    O2 Sat 95 94    S/S 6 6    Pain Level 8/10 6/10     D/C home with Wife, patient A & O X 3, D/C  instructions reviewed, and sits independently.

## 2017-04-21 MED ORDER — BUPIVACAINE LIPOSOME 1.3 % IJ SUSP
20.0000 mL | INTRAMUSCULAR | Status: DC
  Filled 2017-04-21: qty 20

## 2017-04-22 ENCOUNTER — Encounter (HOSPITAL_COMMUNITY)
Admission: RE | Disposition: A | Discharge status: Home / Self Care | Payer: Self-pay | Source: Ambulatory Visit | Attending: Orthopedic Surgery

## 2017-04-22 ENCOUNTER — Ambulatory Visit (HOSPITAL_COMMUNITY): Payer: BLUE CROSS/BLUE SHIELD | Admitting: Anesthesiology

## 2017-04-22 ENCOUNTER — Observation Stay (HOSPITAL_COMMUNITY)
Admission: RE | Admit: 2017-04-22 | Discharge: 2017-04-23 | Disposition: A | Discharge status: Home / Self Care | Payer: BLUE CROSS/BLUE SHIELD | Source: Ambulatory Visit | Attending: Orthopedic Surgery | Admitting: Orthopedic Surgery

## 2017-04-22 ENCOUNTER — Other Ambulatory Visit: Payer: Self-pay

## 2017-04-22 ENCOUNTER — Encounter (HOSPITAL_COMMUNITY): Payer: Self-pay | Admitting: *Deleted

## 2017-04-22 DIAGNOSIS — Z881 Allergy status to other antibiotic agents status: Secondary | ICD-10-CM | POA: Diagnosis not present

## 2017-04-22 DIAGNOSIS — Z888 Allergy status to other drugs, medicaments and biological substances status: Secondary | ICD-10-CM | POA: Insufficient documentation

## 2017-04-22 DIAGNOSIS — Z96659 Presence of unspecified artificial knee joint: Secondary | ICD-10-CM

## 2017-04-22 DIAGNOSIS — G4730 Sleep apnea, unspecified: Secondary | ICD-10-CM | POA: Diagnosis not present

## 2017-04-22 DIAGNOSIS — T84023A Instability of internal left knee prosthesis, initial encounter: Secondary | ICD-10-CM | POA: Diagnosis not present

## 2017-04-22 DIAGNOSIS — T84018A Broken internal joint prosthesis, other site, initial encounter: Secondary | ICD-10-CM

## 2017-04-22 DIAGNOSIS — Z880 Allergy status to penicillin: Secondary | ICD-10-CM | POA: Insufficient documentation

## 2017-04-22 DIAGNOSIS — T849XXA Unspecified complication of internal orthopedic prosthetic device, implant and graft, initial encounter: Secondary | ICD-10-CM | POA: Diagnosis present

## 2017-04-22 HISTORY — PX: TOTAL KNEE REVISION: SHX996

## 2017-04-22 HISTORY — DX: Other intervertebral disc degeneration, lumbosacral region without mention of lumbar back pain or lower extremity pain: M51.379

## 2017-04-22 HISTORY — DX: Psoriasis, unspecified: L40.9

## 2017-04-22 HISTORY — DX: Family history of other specified conditions: Z84.89

## 2017-04-22 HISTORY — DX: Other intervertebral disc degeneration, lumbosacral region: M51.37

## 2017-04-22 HISTORY — DX: Other intervertebral disc degeneration, lumbar region: M51.36

## 2017-04-22 HISTORY — DX: Personal history of urinary calculi: Z87.442

## 2017-04-22 HISTORY — DX: Tachycardia, unspecified: R00.0

## 2017-04-22 HISTORY — DX: Cervical disc disorder, unspecified, unspecified cervical region: M50.90

## 2017-04-22 HISTORY — DX: Presence of unspecified artificial knee joint: T84.018A

## 2017-04-22 HISTORY — DX: Broken internal joint prosthesis, other site, initial encounter: Z96.659

## 2017-04-22 LAB — TYPE AND SCREEN
ABO/RH(D): A POS
ANTIBODY SCREEN: NEGATIVE

## 2017-04-22 LAB — CBC
HCT: 35.1 % — ABNORMAL LOW (ref 39.0–52.0)
Hemoglobin: 10.8 g/dL — ABNORMAL LOW (ref 13.0–17.0)
MCH: 23.3 pg — ABNORMAL LOW (ref 26.0–34.0)
MCHC: 30.8 g/dL (ref 30.0–36.0)
MCV: 75.6 fL — AB (ref 78.0–100.0)
Platelets: 180 10*3/uL (ref 150–400)
RBC: 4.64 MIL/uL (ref 4.22–5.81)
RDW: 17 % — AB (ref 11.5–15.5)
WBC: 5.3 10*3/uL (ref 4.0–10.5)

## 2017-04-22 LAB — COMPREHENSIVE METABOLIC PANEL
ALBUMIN: 3.9 g/dL (ref 3.5–5.0)
ALT: 80 U/L — AB (ref 17–63)
AST: 49 U/L — AB (ref 15–41)
Alkaline Phosphatase: 261 U/L — ABNORMAL HIGH (ref 38–126)
Anion gap: 9 (ref 5–15)
BUN: 18 mg/dL (ref 6–20)
CO2: 24 mmol/L (ref 22–32)
CREATININE: 1.09 mg/dL (ref 0.61–1.24)
Calcium: 8.5 mg/dL — ABNORMAL LOW (ref 8.9–10.3)
Chloride: 104 mmol/L (ref 101–111)
GFR calc Af Amer: >60 mL/min (ref >60)
GLUCOSE: 95 mg/dL (ref 65–99)
POTASSIUM: 3.7 mmol/L (ref 3.5–5.1)
Sodium: 137 mmol/L (ref 135–145)
Total Bilirubin: 0.6 mg/dL (ref 0.3–1.2)
Total Protein: 6.5 g/dL (ref 6.5–8.1)

## 2017-04-22 LAB — SURGICAL PCR SCREEN
MRSA, PCR: NEGATIVE
STAPHYLOCOCCUS AUREUS: NEGATIVE

## 2017-04-22 LAB — APTT: APTT: 28 s (ref 24–36)

## 2017-04-22 LAB — PROTIME-INR
INR: 1.18
Prothrombin Time: 14.9 seconds (ref 11.4–15.2)

## 2017-04-22 SURGERY — TOTAL KNEE REVISION
Anesthesia: Regional | Site: Knee | Laterality: Left

## 2017-04-22 MED ORDER — POLYETHYLENE GLYCOL 3350 17 G PO PACK: 17.0000 g | PACK | Freq: Every day | ORAL | Status: DC | PRN

## 2017-04-22 MED ORDER — BACLOFEN 20 MG PO TABS
20.0000 mg | ORAL_TABLET | Freq: Three times a day (TID) | ORAL | Status: DC
  Administered 2017-04-22 – 2017-04-23 (×2): 20 mg via ORAL
  Filled 2017-04-22 (×2): qty 1

## 2017-04-22 MED ORDER — DEXAMETHASONE SODIUM PHOSPHATE 10 MG/ML IJ SOLN
10.0000 mg | Freq: Once | INTRAMUSCULAR | Status: AC
  Administered 2017-04-22: 5 mg via INTRAVENOUS

## 2017-04-22 MED ORDER — SODIUM CHLORIDE 0.9 % IV SOLN
INTRAVENOUS | Status: DC
  Administered 2017-04-22 – 2017-04-23 (×2): via INTRAVENOUS

## 2017-04-22 MED ORDER — PROPOFOL 10 MG/ML IV BOLUS
INTRAVENOUS | Status: AC
  Filled 2017-04-22: qty 20

## 2017-04-22 MED ORDER — MORPHINE SULFATE ER 30 MG PO TBCR
60.0000 mg | EXTENDED_RELEASE_TABLET | Freq: Two times a day (BID) | ORAL | Status: DC
  Administered 2017-04-22 – 2017-04-23 (×2): 60 mg via ORAL
  Filled 2017-04-22 (×2): qty 2

## 2017-04-22 MED ORDER — DOCUSATE SODIUM 100 MG PO CAPS
100.0000 mg | ORAL_CAPSULE | Freq: Two times a day (BID) | ORAL | Status: DC
  Administered 2017-04-22 – 2017-04-23 (×2): 100 mg via ORAL
  Filled 2017-04-22 (×2): qty 1

## 2017-04-22 MED ORDER — FENTANYL CITRATE (PF) 100 MCG/2ML IJ SOLN
50.0000 ug | INTRAMUSCULAR | Status: DC
  Administered 2017-04-22: 100 ug via INTRAVENOUS
  Administered 2017-04-22 (×2): 50 ug via INTRAVENOUS
  Filled 2017-04-22: qty 2

## 2017-04-22 MED ORDER — ONDANSETRON HCL 4 MG/2ML IJ SOLN
INTRAMUSCULAR | Status: DC | PRN
  Administered 2017-04-22: 4 mg via INTRAVENOUS

## 2017-04-22 MED ORDER — DEXAMETHASONE SODIUM PHOSPHATE 10 MG/ML IJ SOLN
INTRAMUSCULAR | Status: AC
  Filled 2017-04-22: qty 1

## 2017-04-22 MED ORDER — CLINDAMYCIN PHOSPHATE 600 MG/50ML IV SOLN
600.0000 mg | Freq: Four times a day (QID) | INTRAVENOUS | Status: AC
  Administered 2017-04-22 – 2017-04-23 (×2): 600 mg via INTRAVENOUS
  Filled 2017-04-22 (×2): qty 50

## 2017-04-22 MED ORDER — METOCLOPRAMIDE HCL 5 MG/ML IJ SOLN: 5.0000 mg | Freq: Three times a day (TID) | INTRAMUSCULAR | Status: DC | PRN

## 2017-04-22 MED ORDER — FENTANYL CITRATE (PF) 100 MCG/2ML IJ SOLN
INTRAMUSCULAR | Status: AC
  Filled 2017-04-22: qty 2

## 2017-04-22 MED ORDER — DEXAMETHASONE SODIUM PHOSPHATE 10 MG/ML IJ SOLN
10.0000 mg | Freq: Once | INTRAMUSCULAR | Status: DC
  Filled 2017-04-22: qty 1

## 2017-04-22 MED ORDER — RIVAROXABAN 10 MG PO TABS
10.0000 mg | ORAL_TABLET | Freq: Every day | ORAL | Status: DC
  Administered 2017-04-23: 10 mg via ORAL
  Filled 2017-04-22: qty 1

## 2017-04-22 MED ORDER — DIPHENHYDRAMINE HCL 12.5 MG/5ML PO ELIX: 12.5000 mg | ORAL_SOLUTION | ORAL | Status: DC | PRN

## 2017-04-22 MED ORDER — METOCLOPRAMIDE HCL 5 MG PO TABS: 5.0000 mg | ORAL_TABLET | Freq: Three times a day (TID) | ORAL | Status: DC | PRN

## 2017-04-22 MED ORDER — FLEET ENEMA 7-19 GM/118ML RE ENEM: 1.0000 | ENEMA | Freq: Once | RECTAL | Status: DC | PRN

## 2017-04-22 MED ORDER — MORPHINE SULFATE (PF) 2 MG/ML IV SOLN
1.0000 mg | INTRAVENOUS | Status: DC | PRN
  Administered 2017-04-22: 1 mg via INTRAVENOUS
  Filled 2017-04-22: qty 1

## 2017-04-22 MED ORDER — CLINDAMYCIN PHOSPHATE 900 MG/50ML IV SOLN
900.0000 mg | INTRAVENOUS | Status: AC
  Administered 2017-04-22: 900 mg via INTRAVENOUS
  Filled 2017-04-22: qty 50

## 2017-04-22 MED ORDER — SODIUM CHLORIDE 0.9 % IJ SOLN
INTRAMUSCULAR | Status: AC
  Filled 2017-04-22: qty 10

## 2017-04-22 MED ORDER — METHOCARBAMOL 1000 MG/10ML IJ SOLN
500.0000 mg | Freq: Four times a day (QID) | INTRAVENOUS | Status: DC | PRN
  Filled 2017-04-22: qty 5

## 2017-04-22 MED ORDER — ONDANSETRON HCL 4 MG PO TABS: 4.0000 mg | ORAL_TABLET | Freq: Three times a day (TID) | ORAL | Status: DC | PRN

## 2017-04-22 MED ORDER — LACTATED RINGERS IV SOLN
INTRAVENOUS | Status: DC
  Administered 2017-04-22 (×2): via INTRAVENOUS

## 2017-04-22 MED ORDER — SODIUM CHLORIDE 0.9 % IR SOLN
Status: DC | PRN
  Administered 2017-04-22: 1000 mL

## 2017-04-22 MED ORDER — MENTHOL 3 MG MT LOZG: 1.0000 | LOZENGE | OROMUCOSAL | Status: DC | PRN

## 2017-04-22 MED ORDER — OXYCODONE HCL 5 MG PO TABS: 5.0000 mg | ORAL_TABLET | ORAL | Status: DC | PRN

## 2017-04-22 MED ORDER — SODIUM CHLORIDE 0.9 % IJ SOLN
INTRAMUSCULAR | Status: DC | PRN
  Administered 2017-04-22: 60 mL

## 2017-04-22 MED ORDER — NORTRIPTYLINE HCL 10 MG PO CAPS
10.0000 mg | ORAL_CAPSULE | Freq: Every day | ORAL | Status: DC
  Administered 2017-04-22: 10 mg via ORAL
  Filled 2017-04-22: qty 1

## 2017-04-22 MED ORDER — LIDOCAINE 2% (20 MG/ML) 5 ML SYRINGE
INTRAMUSCULAR | Status: AC
  Filled 2017-04-22: qty 5

## 2017-04-22 MED ORDER — PROPOFOL 10 MG/ML IV BOLUS
INTRAVENOUS | Status: AC
  Filled 2017-04-22: qty 40

## 2017-04-22 MED ORDER — OXYCODONE HCL 5 MG PO TABS
10.0000 mg | ORAL_TABLET | ORAL | Status: DC | PRN
  Administered 2017-04-22: 10 mg via ORAL
  Administered 2017-04-23 (×3): 15 mg via ORAL
  Filled 2017-04-22: qty 2
  Filled 2017-04-22 (×3): qty 3

## 2017-04-22 MED ORDER — MIDAZOLAM HCL 2 MG/2ML IJ SOLN
1.0000 mg | INTRAMUSCULAR | Status: DC
  Administered 2017-04-22 (×2): 2 mg via INTRAVENOUS
  Filled 2017-04-22: qty 2

## 2017-04-22 MED ORDER — ONDANSETRON HCL 4 MG/2ML IJ SOLN: 4.0000 mg | Freq: Four times a day (QID) | INTRAMUSCULAR | Status: DC | PRN

## 2017-04-22 MED ORDER — LIDOCAINE 2% (20 MG/ML) 5 ML SYRINGE
INTRAMUSCULAR | Status: DC | PRN
  Administered 2017-04-22: 60 mg via INTRAVENOUS

## 2017-04-22 MED ORDER — PROPOFOL 500 MG/50ML IV EMUL
INTRAVENOUS | Status: DC | PRN
  Administered 2017-04-22: 100 ug/kg/min via INTRAVENOUS

## 2017-04-22 MED ORDER — CLORAZEPATE DIPOTASSIUM 7.5 MG PO TABS
15.0000 mg | ORAL_TABLET | Freq: Every day | ORAL | Status: DC
  Administered 2017-04-22: 15 mg via ORAL
  Filled 2017-04-22: qty 2

## 2017-04-22 MED ORDER — ONDANSETRON HCL 4 MG PO TABS: 4.0000 mg | ORAL_TABLET | Freq: Four times a day (QID) | ORAL | Status: DC | PRN

## 2017-04-22 MED ORDER — BUPIVACAINE LIPOSOME 1.3 % IJ SUSP
INTRAMUSCULAR | Status: DC | PRN
  Administered 2017-04-22: 20 mL

## 2017-04-22 MED ORDER — CHLORHEXIDINE GLUCONATE 4 % EX LIQD: 60.0000 mL | Freq: Once | CUTANEOUS | Status: DC

## 2017-04-22 MED ORDER — PHENYLEPHRINE 40 MCG/ML (10ML) SYRINGE FOR IV PUSH (FOR BLOOD PRESSURE SUPPORT)
PREFILLED_SYRINGE | INTRAVENOUS | Status: DC | PRN
  Administered 2017-04-22: 80 ug via INTRAVENOUS
  Administered 2017-04-22: 8 ug via INTRAVENOUS

## 2017-04-22 MED ORDER — CLORAZEPATE DIPOTASSIUM 7.5 MG PO TABS
7.5000 mg | ORAL_TABLET | Freq: Two times a day (BID) | ORAL | Status: DC
  Administered 2017-04-23: 7.5 mg via ORAL
  Filled 2017-04-22: qty 1

## 2017-04-22 MED ORDER — METHOCARBAMOL 500 MG PO TABS
500.0000 mg | ORAL_TABLET | Freq: Four times a day (QID) | ORAL | Status: DC | PRN
  Administered 2017-04-22 – 2017-04-23 (×2): 500 mg via ORAL
  Filled 2017-04-22 (×2): qty 1

## 2017-04-22 MED ORDER — PHENOL 1.4 % MT LIQD: 1.0000 | OROMUCOSAL | Status: DC | PRN

## 2017-04-22 MED ORDER — PHENYLEPHRINE 40 MCG/ML (10ML) SYRINGE FOR IV PUSH (FOR BLOOD PRESSURE SUPPORT)
PREFILLED_SYRINGE | INTRAVENOUS | Status: AC
  Filled 2017-04-22: qty 10

## 2017-04-22 MED ORDER — BISACODYL 10 MG RE SUPP: 10.0000 mg | Freq: Every day | RECTAL | Status: DC | PRN

## 2017-04-22 MED ORDER — MIDAZOLAM HCL 2 MG/2ML IJ SOLN
INTRAMUSCULAR | Status: AC
  Filled 2017-04-22: qty 2

## 2017-04-22 MED ORDER — ONDANSETRON HCL 4 MG/2ML IJ SOLN
INTRAMUSCULAR | Status: AC
  Filled 2017-04-22: qty 2

## 2017-04-22 MED ORDER — SODIUM CHLORIDE 0.9 % IJ SOLN
INTRAMUSCULAR | Status: AC
  Filled 2017-04-22: qty 50

## 2017-04-22 MED ORDER — VENLAFAXINE HCL ER 150 MG PO CP24
300.0000 mg | ORAL_CAPSULE | Freq: Every day | ORAL | Status: DC
  Administered 2017-04-23: 300 mg via ORAL
  Filled 2017-04-22: qty 2

## 2017-04-22 MED ORDER — MORPHINE SULFATE ER 60 MG PO CP24: 60.0000 mg | ORAL_CAPSULE | Freq: Two times a day (BID) | ORAL | Status: DC

## 2017-04-22 MED ORDER — PRAZOSIN HCL 1 MG PO CAPS
3.0000 mg | ORAL_CAPSULE | Freq: Every day | ORAL | Status: DC
  Administered 2017-04-22: 3 mg via ORAL
  Filled 2017-04-22: qty 3

## 2017-04-22 SURGICAL SUPPLY — 46 items
BAG DECANTER FOR FLEXI CONT (MISCELLANEOUS) ×2 IMPLANT
BAG ZIPLOCK 12X15 (MISCELLANEOUS) IMPLANT
BANDAGE ACE 6X5 VEL STRL LF (GAUZE/BANDAGES/DRESSINGS) ×2 IMPLANT
BLADE SAG 18X100X1.27 (BLADE) ×2 IMPLANT
BLADE SAW SGTL 11.0X1.19X90.0M (BLADE) IMPLANT
CLOTH BEACON ORANGE TIMEOUT ST (SAFETY) ×2 IMPLANT
COVER SURGICAL LIGHT HANDLE (MISCELLANEOUS) ×2 IMPLANT
CUFF TOURN SGL QUICK 34 (TOURNIQUET CUFF) ×1
CUFF TRNQT CYL 34X4X40X1 (TOURNIQUET CUFF) ×1 IMPLANT
DRAPE U-SHAPE 47X51 STRL (DRAPES) ×2 IMPLANT
DRSG ADAPTIC 3X8 NADH LF (GAUZE/BANDAGES/DRESSINGS) ×2 IMPLANT
DRSG PAD ABDOMINAL 8X10 ST (GAUZE/BANDAGES/DRESSINGS) ×2 IMPLANT
DURAPREP 26ML APPLICATOR (WOUND CARE) ×2 IMPLANT
ELECT REM PT RETURN 15FT ADLT (MISCELLANEOUS) ×2 IMPLANT
EVACUATOR 1/8 PVC DRAIN (DRAIN) ×2 IMPLANT
GAUZE SPONGE 4X4 12PLY STRL (GAUZE/BANDAGES/DRESSINGS) ×2 IMPLANT
GLOVE BIO SURGEON STRL SZ7.5 (GLOVE) IMPLANT
GLOVE BIO SURGEON STRL SZ8 (GLOVE) ×2 IMPLANT
GLOVE BIOGEL PI IND STRL 7.0 (GLOVE) ×4 IMPLANT
GLOVE BIOGEL PI IND STRL 8 (GLOVE) ×1 IMPLANT
GLOVE BIOGEL PI INDICATOR 7.0 (GLOVE) ×4
GLOVE BIOGEL PI INDICATOR 8 (GLOVE) ×1
GLOVE SURG SS PI 6.5 STRL IVOR (GLOVE) IMPLANT
GOWN STRL REUS W/TWL LRG LVL3 (GOWN DISPOSABLE) ×6 IMPLANT
GOWN STRL REUS W/TWL XL LVL3 (GOWN DISPOSABLE) ×2 IMPLANT
HANDPIECE INTERPULSE COAX TIP (DISPOSABLE) ×1
IMMOBILIZER KNEE 20 (SOFTGOODS) ×2
IMMOBILIZER KNEE 20 THIGH 36 (SOFTGOODS) ×1 IMPLANT
INSERT SIGMA RP TC3 5 17.5 (Knees) ×2 IMPLANT
MANIFOLD NEPTUNE II (INSTRUMENTS) ×2 IMPLANT
NS IRRIG 1000ML POUR BTL (IV SOLUTION) ×2 IMPLANT
PACK TOTAL KNEE CUSTOM (KITS) ×2 IMPLANT
PADDING CAST COTTON 6X4 STRL (CAST SUPPLIES) ×2 IMPLANT
POSITIONER SURGICAL ARM (MISCELLANEOUS) ×2 IMPLANT
SET HNDPC FAN SPRY TIP SCT (DISPOSABLE) ×1 IMPLANT
STRIP CLOSURE SKIN 1/2X4 (GAUZE/BANDAGES/DRESSINGS) ×2 IMPLANT
SUT STRATAFIX 0 PDS 27 VIOLET (SUTURE) ×2
SUT VIC AB 2-0 CT1 27 (SUTURE) ×3
SUT VIC AB 2-0 CT1 TAPERPNT 27 (SUTURE) ×3 IMPLANT
SUTURE STRATFX 0 PDS 27 VIOLET (SUTURE) ×1 IMPLANT
SWAB COLLECTION DEVICE MRSA (MISCELLANEOUS) IMPLANT
SYR 50ML LL SCALE MARK (SYRINGE) ×4 IMPLANT
TOWER CARTRIDGE SMART MIX (DISPOSABLE) IMPLANT
TUBE KAMVAC SUCTION (TUBING) IMPLANT
WATER STERILE IRR 1000ML POUR (IV SOLUTION) IMPLANT
WRAP KNEE MAXI GEL POST OP (GAUZE/BANDAGES/DRESSINGS) IMPLANT

## 2017-04-22 NOTE — Anesthesia Preprocedure Evaluation (Addendum)
Anesthesia Evaluation  Patient identified by MRN, date of birth, ID band Patient awake    Reviewed: Allergy & Precautions, NPO status   History of Anesthesia Complications (+) PONV  Airway Mallampati: II  TM Distance: >3 FB Neck ROM: Full    Dental no notable dental hx.    Pulmonary sleep apnea and Continuous Positive Airway Pressure Ventilation ,    Pulmonary exam normal breath sounds clear to auscultation       Cardiovascular Past MI:    Normal cardiovascular exam Rhythm:Regular Rate:Normal  12/15/16 echo-Wall thickness was   increased in a pattern of mild LVH. Systolic function was normal.   The estimated ejection fraction was in the range of 60% to 65%.   Wall motion was normal;   Neuro/Psych Anxiety CVA    GI/Hepatic negative GI ROS, Neg liver ROS,   Endo/Other  negative endocrine ROS  Renal/GU negative Renal ROS  negative genitourinary   Musculoskeletal   Abdominal   Peds  Hematology   Anesthesia Other Findings   Reproductive/Obstetrics                           Lab Results  Component Value Date   WBC 3.6 (L) 04/09/2017   HGB 11.8 (L) 04/09/2017   HCT 39.1 04/09/2017   MCV 77.1 (L) 04/09/2017   PLT 154 04/09/2017   Lab Results  Component Value Date   CREATININE 1.16 03/17/2017   BUN 19 03/17/2017   NA 138 03/17/2017   K 4.3 03/17/2017   CL 101 03/17/2017   CO2 21 03/17/2017     Anesthesia Physical Anesthesia Plan  ASA: III  Anesthesia Plan: Spinal and Regional   Post-op Pain Management:    Induction:   PONV Risk Score and Plan: Treatment may vary due to age or medical condition  Airway Management Planned: Mask, Natural Airway and Nasal Cannula  Additional Equipment:   Intra-op Plan:   Post-operative Plan:   Informed Consent: I have reviewed the patients History and Physical, chart, labs and discussed the procedure including the risks, benefits and  alternatives for the proposed anesthesia with the patient or authorized representative who has indicated his/her understanding and acceptance.     Plan Discussed with: CRNA and Anesthesiologist  Anesthesia Plan Comments:         Anesthesia Quick Evaluation

## 2017-04-22 NOTE — Plan of Care (Signed)
Plan of care discussed with patient

## 2017-04-22 NOTE — H&P (Signed)
Subjective:   Patient is a 52 y.o. adult presents with instability of his left knee replacement. Onset of symptoms was several months ago. He has tried bracing and PT without benefit. He presents today for polyethylene vs. TKA revision  Patient Active Problem List   Diagnosis Date Noted  . Failed total knee arthroplasty, 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    Priority: High  . Lumbar radiculitis 04/20/2017  . Neuropathic pain 12/10/2016  . OA (osteoarthritis) of knee 02/06/2016  . Long term (current) use of anticoagulants 08/23/2015  . Right upper quadrant abdominal pain 08/23/2015  . Central apnea 06/18/2015  . Memory loss 05/10/2015  . Personal history of venous thrombosis and embolism 03/26/2015  . Cognitive decline 12/21/2014  . Rotator cuff syndrome of right shoulder 10/27/2014  . Status post left knee replacement 08/22/2014  . Left lateral epicondylitis 08/22/2014  . 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 Abnormal finding on MRI of brain 04/10/2014  . Chronic pain syndrome 04/10/2014  . Chronic fatigue 04/10/2014  . Numbness 04/10/2014  . OSA (obstructive sleep apnea) 04/10/2014  . Insomnia 04/10/2014  . 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 Biceps tendonitis on left 03/01/2014  . Polycythemia, secondary 12/27/2013  . H/O TIA (transient ischemic attack) and stroke 12/27/2013  . Psoriatic arthritis ([place]) 12/27/2013  . History of celiac disease 12/27/2013  . Weight loss, unintentional 12/27/2013  . H/O protein C deficiency 12/27/2013  . Transgender 12/27/2013  . Sleep apnea 12/27/2013  . H/O parotitis 12/27/2013  . Neck pain 12/27/2013  . Depression with anxiety 12/27/2013  . DVT (deep venous thrombosis) ([place]) 12/27/2013   Past Medical History:  Diagnosis Date  . Abnormal weight loss   . Anxiety    PTSD per patient  . Celiac disease   . Cervical neck pain with evidence of disc disease    patient has a cyst   . Degenerative disc disease at  L5-S1 level   . Family history of adverse reaction to anesthesia    family has problems with anesthesia of nausea and vomiting   . Gender bias    prefers Male gender identity- "remains with vagina"  . Gluten enteropathy   . H/O parotitis    right   . H/O protein C deficiency    Dr. Anne Fu  . History of kidney stones   . Hx-TIA (transient ischemic attack)    2015  . Neck pain   . Neuromuscular disorder ([place])    bilateral neuropathy feet.  . Polycythemia, secondary   . PONV (postoperative nausea and vomiting)   . Psoriasis   . psoriatic arthritis   . Sleep apnea    cpap use- Dr. [name] follows  . Stroke [facility])    Stroke left side -slight right sided weakness-Dr. [name] follows  . 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 Tachycardia    hx of   . Transfusion history    past history- none recent  . Transgendered     Past Surgical History:  Procedure Laterality Date  . ABDOMINAL HYSTERECTOMY     TAH, BSO- tranverse incision.  [name] ANKLE ARTHROSCOPY WITH RECONSTRUCTION Right 2007  . CHOLECYSTECTOMY     laparoscopic  . HIP ARTHROSCOPY W/ LABRAL REPAIR Right 05/11/2013  . KNEE SURGERY Bilateral 1984   Right ACL, left PCL repair  . LIVER BIOPSY  2013   normal results.  [name] NASAL SEPTUM SURGERY N/A 09/20/2015  . THYROIDECTOMY, PARTIAL  2008  . TOTAL KNEE REVISION Left 02/06/2016   Procedure: LEFT TOTAL KNEE REVISION;  Surgeon: [name], MD;  Location: [facility];  Service: Orthopedics;  Laterality: Left;    No medications prior to admission.   Allergies  Allergen Reactions  . Penicillin G Anaphylaxis and Other (See Comments)    Has patient had a PCN reaction causing immediate rash, facial/tongue/throat swelling, SOB or lightheadedness with hypotension: Yes Has patient had a PCN reaction causing severe rash involving mucus membranes or skin necrosis: No Has patient had a PCN reaction that required hospitalization Yes Has patient had a PCN reaction occurring  within the last 10 years: No If all of the above answers are "NO", then may proceed with Cephalosporin use.   . Vancomycin Rash and Other (See Comments)    RED MAN SYNDROME CAN HAVE IF GIVEN OVER 2HOURS  . Duloxetine Other (See Comments)    Restless legs  . Gabapentin Nausea Only and Other (See Comments)    Other reaction(s): nausea, mental status  . Acetaminophen Other (See Comments)    Elevates liver enzymes.   . Ibuprofen Other (See Comments)    Contraindicated with Xarelto.   . Nortriptyline Other (See Comments)    Dry mouth at 25 mg dose.  Tolerates 10 mg dose  . Pregabalin Other (See Comments)    Ineffective  . Sulfa Antibiotics Rash and Other (See Comments)    Stevens-Johnson rash  . Sulfacetamide Sodium-Sulfur Rash    Social History   Tobacco Use  . Smoking status: Never Smoker  . Smokeless tobacco: Never Used  Substance Use Topics  . Alcohol use: No    Frequency: Never    Family History  Problem Relation Age of Onset  . Stroke Maternal Grandfather        82  . Heart attack Maternal Grandfather   . Hypertension Mother   . Psoriasis Mother   . Cancer Paternal Grandfather   . Heart attack Paternal Grandfather   . Stroke Paternal Uncle        age 15  . Stroke Maternal Grandmother   . Congestive Heart Failure Maternal Grandmother   . Heart attack Maternal Grandmother   . Protein C deficiency Sister 58       Miscarriages    Review of Systems   Objective:   No data found. No intake/output data recorded. No intake/output data recorded.    General appearance: alert and cooperative Chest wall: no tenderness Heart: regular rate and rhythm, S1, S2 normal, no murmur, click, rub or gallop Abdomen: soft, non-tender; bowel sounds normal; no masses,  no organomegaly Neurologic: Alert and oriented X 3, normal strength and tone. Normal symmetric reflexes. Normal coordination and gait  Left knee - no effusion; range 0-125; mild varus-valgus and  anterior-posterior laxity, no tenderness      Assessment:   Unstable left Total Knee Arthroplasty  Plan:   Left knee polyethylene vs. TKA revision. Discussed in detail with patient who elects to proceed.

## 2017-04-22 NOTE — Anesthesia Procedure Notes (Addendum)
Anesthesia Regional Block: Adductor canal block   Pre-Anesthetic Checklist: ,, timeout performed, Correct Patient, Correct Site, Correct Laterality, Correct Procedure, Correct Position, site marked, Risks and benefits discussed,  Surgical consent,  Pre-op evaluation,  At surgeon's request and post-op pain management  Laterality: Left  Prep: chloraprep       Needles:  Injection technique: Single-shot  Needle Type: Echogenic Needle     Needle Length: 9cm  Needle Gauge: 21     Additional Needles:   Procedures:,,,, ultrasound used (permanent image in chart),,,,  Narrative:  Start time: 04/22/2017 11:35 AM End time: 04/22/2017 11:42 AM Injection made incrementally with aspirations every 5 mL.  Performed by: Personally  Anesthesiologist: Barnet Glasgow, MD

## 2017-04-22 NOTE — Progress Notes (Signed)
CPAP settings per pt:  Air curve 10: RESMED  EPAP 7 PS MIN 4 PS MAX 15

## 2017-04-22 NOTE — Op Note (Signed)
NAME:  Jesse Bowers, Jesse Bowers NO.:  [id number]  MEDICAL RECORD NO.:  [id number]  LOCATION:                                 FACILITY:  PHYSICIAN:  [name], M.D.         DATE OF BIRTH:  DATE OF PROCEDURE:  04/22/2017 DATE OF DISCHARGE:                              OPERATIVE REPORT   PREOPERATIVE DIAGNOSIS:  Unstable left total knee arthroplasty.  POSTOPERATIVE DIAGNOSIS:  Unstable left total knee arthroplasty.  PROCEDURE:  Left knee polyethylene revision.  SURGEON:  [name], M.D.  ASSISTANT:  Alexzandrew L. Perkins, P.A.C.  ANESTHESIA:  Spinal.  ESTIMATED BLOOD LOSS:  Minimal.  DRAINS:  Hemovac x1.  TOURNIQUET TIME:  41 minutes at 300 mmHg.  COMPLICATIONS:  None.  CONDITION:  Stable to recovery.  BRIEF CLINICAL NOTE:  Jesse Bowers is a 52 year old who had a total knee arthroplasty revision about a year ago.  He has had multiple falls and subsequent to that, the knee has become slightly unstable.  He is having symptoms from the instability.  He has not responded well to bracing. He presents now for polyethylene versus total knee revision.  PROCEDURE IN DETAIL:  After successful administration of spinal anesthetic, a tourniquet was placed high on the left thigh and his left lower extremity was prepped and draped in usual sterile fashion. Extremities wrapped in Esmarch and tourniquet inflated to 300 mmHg. Midline incision was made with a 10-blade through the subcutaneous tissue to the extensor mechanism.  A fresh blade was used to make a medial parapatellar arthrotomy.  Soft tissue on the proximal medial tibia subperiosteally elevated to the joint line with a knife and into the semimembranosus bursa with a Cobb elevator.  Soft tissue laterally was elevated with attention being paid to avoiding the patellar tendon on the tibial tubercle.  The scar tissue in the extensor mechanism was removed and the patella everted.  The knee was flexed 90 degrees.   He had a TC3 polyethylene, which has the post in it.  Because of that, it was not easily removed.  I had to remove it piecemeal in order to extract the post and extract the polyethylene.  It was a 12.5-mm thick polyethylene.  After being removed, I did a trial up to 17.5.  With the 17.5, full extension was achieved with excellent varus-valgus and anterior-posterior balance throughout full range of motion.  The trial was removed, then the permanent 17.5-mm TC3 rotating platform insert for the size 5 femur was placed.  The knee was reduced with outstanding stability throughout full range of motion.  The wound was then copiously irrigated with saline solution and the arthrotomy closed over Hemovac drain with a running #1 Stratafix suture.  The Exparel, which was 20 mL mixed with 60 mL of saline was injected into the extensor mechanism, periosteum of the femur and subcu tissues prior to closure of the arthrotomy.  The tourniquet was then released, total of 41 minutes. Subcu was then closed with interrupted 2-0 Vicryl and subcuticular running 4-0 Monocryl.  The incision was then cleaned and dried, and Steri-Strips and a bulky sterile dressing applied.  He was then awakened, transported  to recovery in stable condition.  Please note that the surgical assistant is a medical necessity for this procedure to do it in a safe and expeditious manner.  Surgical assistant was necessary for retraction of vital ligaments and neurovascular structures to safely remove the old prosthesis and then accurately and safely placed a new one.     [name], M.D.     FA/MEDQ  D:  04/22/2017  T:  04/22/2017  Job:  [id number]

## 2017-04-22 NOTE — Transfer of Care (Signed)
Immediate Anesthesia Transfer of Care Note  Patient: Jesse Bowers  Procedure(s) Performed: Left knee polyethylene revision (Left Knee)  Patient Location: PACU  Anesthesia Type:Spinal  Level of Consciousness: drowsy  Airway & Oxygen Therapy: Patient Spontanous Breathing and Patient connected to face mask  Post-op Assessment: Report given to RN and Post -op Vital signs reviewed and stable  Post vital signs: Reviewed and stable  Last Vitals:  Vitals:   04/22/17 1155 04/22/17 1200  BP: 108/72 114/79  Pulse: 77 82  Resp: 13 17  Temp:    SpO2: 99% 97%    Last Pain:  Vitals:   04/22/17 1058  TempSrc: Oral  PainSc: 6          Complications: No apparent anesthesia complications

## 2017-04-22 NOTE — Brief Op Note (Signed)
04/22/2017  2:21 PM  PATIENT:  Jesse Bowers  52 y.o. adult  PRE-OPERATIVE DIAGNOSIS:  Unstable left total knee arthroplasty  POST-OPERATIVE DIAGNOSIS:  Unstable left total knee arthroplasty  PROCEDURE:  Procedure(s): Left knee polyethylene revision (Left)  SURGEON:  Surgeon(s) and Role:    [name], MD - Primary  PHYSICIAN ASSISTANT:   ASSISTANTS: Arlee Muslim, PA-C   ANESTHESIA:   spinal and adductor canal   EBL:  50 ml  BLOOD ADMINISTERED:none  DRAINS: (Medium) Hemovact drain(s) in the left knee with  Suction Open   LOCAL MEDICATIONS USED:  OTHER Exparel 20 ml  COUNTS:  YES  TOURNIQUET:   Total Tourniquet Time Documented: Thigh (Left) - 41 minutes Total: Thigh (Left) - 41 minutes   DICTATION: .Other Dictation: Dictation Number [id number]  PLAN OF CARE: Admit for overnight observation  PATIENT DISPOSITION:  PACU - hemodynamically stable.

## 2017-04-22 NOTE — Anesthesia Postprocedure Evaluation (Signed)
Anesthesia Post Note  Patient: Jesse Bowers  Procedure(s) Performed: Left knee polyethylene revision (Left Knee)     Patient location during evaluation: PACU Anesthesia Type: Regional Level of consciousness: oriented and awake and alert Pain management: pain level controlled Vital Signs Assessment: post-procedure vital signs reviewed and stable Respiratory status: spontaneous breathing, respiratory function stable and patient connected to nasal cannula oxygen Cardiovascular status: blood pressure returned to baseline and stable Postop Assessment: no headache, no backache and no apparent nausea or vomiting Anesthetic complications: no    Last Vitals:  Vitals:   04/22/17 1155 04/22/17 1200  BP: 108/72 114/79  Pulse: 77 82  Resp: 13 17  Temp:    SpO2: 99% 97%    Last Pain:  Vitals:   04/22/17 1058  TempSrc: Oral  PainSc: 6                  Barnet Glasgow

## 2017-04-22 NOTE — Progress Notes (Signed)
AssistedDr. Valma Cava with left, ultrasound guided, adductor canal block. Side rails up, monitors on throughout procedure. See vital signs in flow sheet. Tolerated Procedure well.

## 2017-04-23 ENCOUNTER — Encounter (HOSPITAL_COMMUNITY): Payer: Self-pay | Admitting: Orthopedic Surgery

## 2017-04-23 ENCOUNTER — Telehealth: Payer: Self-pay | Admitting: *Deleted

## 2017-04-23 DIAGNOSIS — T84023A Instability of internal left knee prosthesis, initial encounter: Secondary | ICD-10-CM | POA: Diagnosis not present

## 2017-04-23 LAB — BASIC METABOLIC PANEL
ANION GAP: 7 (ref 5–15)
BUN: 18 mg/dL (ref 6–20)
CALCIUM: 8.2 mg/dL — AB (ref 8.9–10.3)
CO2: 26 mmol/L (ref 22–32)
Chloride: 105 mmol/L (ref 101–111)
Creatinine, Ser: 1.03 mg/dL (ref 0.61–1.24)
Glucose, Bld: 139 mg/dL — ABNORMAL HIGH (ref 65–99)
POTASSIUM: 4.3 mmol/L (ref 3.5–5.1)
Sodium: 138 mmol/L (ref 135–145)

## 2017-04-23 LAB — CBC
HEMATOCRIT: 31.1 % — AB (ref 39.0–52.0)
Hemoglobin: 9.5 g/dL — ABNORMAL LOW (ref 13.0–17.0)
MCH: 23.4 pg — ABNORMAL LOW (ref 26.0–34.0)
MCHC: 30.5 g/dL (ref 30.0–36.0)
MCV: 76.6 fL — ABNORMAL LOW (ref 78.0–100.0)
PLATELETS: 193 10*3/uL (ref 150–400)
RBC: 4.06 MIL/uL — AB (ref 4.22–5.81)
RDW: 16.9 % — AB (ref 11.5–15.5)
WBC: 7.1 10*3/uL (ref 4.0–10.5)

## 2017-04-23 MED ORDER — METHOCARBAMOL 500 MG PO TABS: 500.0000 mg | ORAL_TABLET | Freq: Four times a day (QID) | ORAL | 0 refills | Status: DC | PRN

## 2017-04-23 MED ORDER — OXYCODONE HCL 10 MG PO TABS: 10.0000 mg | ORAL_TABLET | ORAL | 0 refills | Status: DC | PRN

## 2017-04-23 NOTE — Progress Notes (Signed)
Discharge planning, spoke with patient and spouse at bedside. Have chosen [facility] PT, evaluate and treat. Contacted [facility] for referral. Has [place] and [address]. [phone]

## 2017-04-23 NOTE — Progress Notes (Signed)
OT Cancellation Note  Patient Details Name: Jesse Bowers MRN: [id number] DOB: 05-08-1965   Cancelled Treatment:    Reason Eval/Treat Not Completed: OT screened, no needs identified, will sign off  Dashanae Longfield 04/23/2017, 11:41 AM  Lesle Chris, OTR/L [phone] 04/23/2017

## 2017-04-23 NOTE — Telephone Encounter (Signed)
Return Jesse Bowers call, he was instructed to follow Dr. Wynelle Link instructions, he verbalizes understanding.

## 2017-04-23 NOTE — Discharge Instructions (Signed)
Artificial Urinary Sphincter Placement, Care After This sheet gives you information about how to care for yourself after your procedure. Your health care provider may also give you more specific instructions. If you have problems or questions, contact your health care provider. What can I expect after the procedure? After the procedure, it is common to have:  Pain or discomfort: ? In your scrotum, if you are male. ? Between your vagina and anus (perineum), if you are male.  Discomfort in your abdomen.  Your artificial urinary sphincter (AUS) will be activated 4-6 weeks after the procedure. Until it is activated, you may continue to have symptoms of not being able to control urine (urinary incontinence). Follow these instructions at home: Medicines  Take over-the-counter and prescription medicines only as told by your health care provider.  If you were prescribed an antibiotic medicine, take it as told by your health care provider. Do not stop taking the antibiotic even if you start to feel better. Incision care  Follow instructions from your health care provider about how to take care of your incision. Make sure you: ? Wash your hands with soap and water before you change your bandage (dressing). If soap and water are not available, use hand sanitizer. ? Change your dressing as told by your health care provider. ? Leave stitches (sutures), skin glue, or adhesive strips in place. These skin closures may need to stay in place for 2 weeks or longer. If adhesive strip edges start to loosen and curl up, you may trim the loose edges. Do not remove adhesive strips completely unless your health care provider tells you to do that.  Check your incision every day for signs of infection. Check for: ? Redness, swelling, or pain. ? Fluid or blood. ? Pus or a bad smell. ? Warmth. Eating and drinking  For 4-6 weeks, avoid foods and drinks that can irritate the bladder, such as coffee, alcohol,  citrus, or spicy foods.  Drink enough fluid to keep your urine clear or pale yellow.  Eat foods that are high in fiber, such as fresh fruits and vegetables, whole grains, and beans. Activity  Return to your normal activities as told by your health care provider. Ask your health care provider what activities are safe for you.  Exercise regularly as told by your health care provider. Avoid exercises that: ? Strain your abdomen or pelvis. ? May cause impact or injury to your pelvis or groin.  Do not drive for 24 hours if you were given a medicine to help you relax (sedative) during the procedure.  Do not lift anything that is heavier than 10 lb (4.5 kg) or the limit that your health care provider tells you until he or she says that it is safe. Lifestyle  Maintain a healthy weight. Ask your health care provider what a healthy weight is for you.  Once the AUS is activated, plan periodic bathroom breaks during the day. If you are not sure if you will be able to have a bathroom break for a while, be careful about how much fluid you drink.  Do not use any products that contain nicotine or tobacco, such as cigarettes and e-cigarettes. If you need help quitting, ask your health care provider. General instructions  Do not take baths, swim, or use a hot tub until your health care provider approves. Ask your health care provider when it is okay for you to shower.  Practice Kegel exercises as told by your health care provider. These  exercises help to strengthen the muscles that control your bladder.  If you are male, follow instructions from your health care provider about how to pull down the AUS pump in your scrotum. You may need to do this for a couple of weeks after the procedure.  Be sure to carry a card or medical bracelet that says you have an AUS.  Keep all follow-up visits as told by your health care provider. This is important. Contact a health care provider if:  You have new or worse  urinary symptoms.  You have pain when urinating.  You have blood in your urine.  You have a fever.  There are signs of infection near the incision, such as: ? Redness, swelling, or pain. ? Fluid or blood. ? Pus or a bad smell. ? Warmth. Get help right away if:  You have severe pain in your scrotum (male).  You have pain, discomfort, or bloating in your abdomen.  You have severe pain when urinating.  You often feel a painful, urgent need to urinate.  You have swelling in your legs, feet, or ankles.  You have shortness of breath or chest pain.  You have red blotches on your skin.  You cannot urinate. Summary  After the procedure, it is common for men to have some pain in the scrotum, and for women to have some pain between the vagina and anus (perineum).  You will not use your artificial urinary sphincter (AUS) right away. It will be activated 4-6 weeks after the procedure.  Until the AUS is activated, you may still have symptoms of urinary incontinence.  Avoid foods or drinks that may irritate the bladder, such as coffee, alcohol, citrus, or spicy foods.  Be sure to carry a card or medical bracelet that says you have an AUS. This information is not intended to replace advice given to you by your health care provider. Make sure you discuss any questions you have with your health care provider. Document Released: 05/08/2016 Document Revised: 05/08/2016 Document Reviewed: 05/08/2016 Elsevier Interactive Patient Education  2018 [employer]. [employer]

## 2017-04-23 NOTE — Telephone Encounter (Signed)
Jesse Bowers is out of the hospital after the knee replacement.  He was asking about his medication.  Dr Wynelle Link increased his oxycodone. I told him that he is to follow his surgeons directions on medications for now in the post op period and once Dr Wynelle Link releases him or tells him to return to our prescribing, he will come back to resume care with our office. He was asking for Zella Ball so I will forward this message to her.

## 2017-04-23 NOTE — Evaluation (Signed)
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  Pt will benefit from skilled PT to increase their independence and safety with mobility to allow discharge to the venue listed below.  Pt ambulated in hallway and performed LE exercises.   Pt provided with HEP handout and answered pt's questions within scope of practice. Pt did not feel he needed to practice steps so verbally reviewed.  Pt feels comfortable with d/c home today and reports he will have HHPT upon d/c and then go to OP PT.     Follow Up Recommendations Home health PT;Follow surgeon's recommendation for DC plan and follow-up therapies    Equipment Recommendations  None recommended by PT    Recommendations for Other Services       Precautions / Restrictions Precautions Precautions: Knee Required Braces or Orthoses: Knee Immobilizer - Left Knee Immobilizer - Left: Discontinue once straight leg raise with < 10 degree lag Restrictions Weight Bearing Restrictions: No Other Position/Activity Restrictions: WBAT      Mobility  Bed Mobility Overal bed mobility: Needs Assistance Bed Mobility: Supine to Sit;Sit to Supine     Supine to sit: Min guard Sit to supine: Min guard   General bed mobility comments: effortful and required increased time, self assisted L LE using UEs  Transfers Overall transfer level: Needs assistance Equipment used: Rolling walker (2 wheeled) Transfers: Sit to/from Stand Sit to Stand: Min guard         General transfer comment: min/guard for safety, verbal cues for UE and LE positioning  Ambulation/Gait Ambulation/Gait assistance: Min guard Ambulation Distance (Feet): 80  Feet Assistive device: Rolling walker (2 wheeled) Gait Pattern/deviations: Step-to pattern;Decreased stance time - left;Antalgic;Decreased dorsiflexion - left     General Gait Details: pt reports wearing orthotics at baseline due to "toes curl" with swing phase, verbal cues for sequence and posture  Stairs Stairs: (verbally reviewed, pt did not feel he needed to practice)          Wheelchair Mobility    Modified Rankin (Stroke Patients Only)       Balance                                             Pertinent Vitals/Pain Pain Assessment: 0-10 Pain Score: 5  Pain Location: L knee Pain Descriptors / Indicators: Aching;Sore Pain Intervention(s): Limited activity within patient's tolerance;Repositioned;Monitored during session    [place] expects to be discharged to:: Private residence Living Arrangements: Spouse/significant other Available Help at Discharge: Family Type of Home: House Home Access: Stairs to enter Entrance Stairs-Rails: Right Entrance Stairs-Number of Steps: 3-4 Home Layout: Able to live on main level with bedroom/bathroom Home Equipment: Wheelchair - [employer] - 2 wheels      Prior Function Level of Independence: Independent               Hand Dominance        Extremity/Trunk Assessment        Lower Extremity Assessment Lower Extremity Assessment: LLE deficits/detail LLE Deficits / Details: pt reports using estim  for quad muscle prior to surgery, unable to perform SLR, poor quad contraction, -8-80* AAROM L knee       Communication   Communication: No difficulties  Cognition Arousal/Alertness: Awake/alert Behavior During Therapy: WFL for tasks assessed/performed Overall Cognitive Status: Within Functional Limits for tasks assessed                                        General Comments      Exercises Total Joint Exercises Ankle Circles/Pumps: AROM;Both;10 reps Quad  Sets: AROM;Both;10 reps Short Arc Quad: AAROM;10 reps;Left Heel Slides: AAROM;10 reps;Seated;Left Hip ABduction/ADduction: 10 reps;AROM;Left Straight Leg Raises: Left;PROM;10 reps   Assessment/Plan    PT Assessment All further PT needs can be met in the next venue of care  PT Problem List Decreased strength;Decreased mobility;Decreased range of motion;Pain       PT Treatment Interventions      PT Goals (Current goals can be found in the Care Plan section)  Acute Rehab PT Goals PT Goal Formulation: All assessment and education complete, DC therapy    Frequency     Barriers to discharge        Co-evaluation               AM-PAC PT "6 Clicks" Daily Activity  Outcome Measure Difficulty turning over in bed (including adjusting bedclothes, sheets and blankets)?: A Little Difficulty moving from lying on back to sitting on the side of the bed? : A Lot Difficulty sitting down on and standing up from a chair with arms (e.g., wheelchair, bedside commode, etc,.)?: Unable Help needed moving to and from a bed to chair (including a wheelchair)?: A Little Help needed walking in hospital room?: A Little Help needed climbing 3-5 steps with a railing? : A Little 6 Click Score: 15    End of Session Equipment Utilized During Treatment: Gait belt Activity Tolerance: Patient tolerated treatment well Patient left: in bed;with family/visitor present;with call bell/phone within reach Nurse Communication: Mobility status PT Visit Diagnosis: Other abnormalities of gait and mobility (R26.89);Pain Pain - Right/Left: Left Pain - part of body: Knee    Time: 0355-9741 PT Time Calculation (min) (ACUTE ONLY): 28 min   Charges:   PT Evaluation $PT Eval Low Complexity: 1 Low PT Treatments $Therapeutic Exercise: 8-22 mins   PT G Codes:        Carmelia Bake, PT, DPT 04/23/2017 Pager: [phone]  [name] E 04/23/2017, 1:06 PM

## 2017-04-23 NOTE — Progress Notes (Signed)
Subjective: 1 Day Post-Op Procedure(s) (LRB): Left knee polyethylene revision (Left) Patient reports pain as mild and moderate.   Patient seen in rounds by Dr. Wynelle Link. Patient is well, but has had some minor complaints of pain in the knee, requiring pain medications We will start therapy today.  If they do well with therapy and meets all goals, then will allow home later this afternoon following therapy. Plan is to go Home after hospital stay.  Objective: Vital signs in last 24 hours: Temp:  [97.6 F (36.4 C)-99 F (37.2 C)] 98.6 F (37 C) (02/21 0500) Pulse Rate:  [76-93] 88 (02/21 0500) Resp:  [9-26] 16 (02/21 0500) BP: (106-133)/(59-90) 106/59 (02/21 0500) SpO2:  [96 %-100 %] 97 % (02/21 0500) Weight:  [108 kg (238 lb)-108.1 kg (238 lb 6.4 oz)] 108 kg (238 lb) (02/20 1700)  Intake/Output from previous day:  Intake/Output Summary (Last 24 hours) at 04/23/2017 0755 Last data filed at 04/23/2017 0600 Gross per 24 hour  Intake 3283.33 ml  Output 1127 ml  Net 2156.33 ml    Intake/Output this shift: No intake/output data recorded.  Labs: Recent Labs    04/22/17 1100 04/23/17 0541  HGB 10.8* 9.5*   Recent Labs    04/22/17 1100 04/23/17 0541  WBC 5.3 7.1  RBC 4.64 4.06*  HCT 35.1* 31.1*  PLT 180 193   Recent Labs    04/22/17 1100 04/23/17 0541  NA 137 138  K 3.7 4.3  CL 104 105  CO2 24 26  BUN 18 18  CREATININE 1.09 1.03  GLUCOSE 95 139*  CALCIUM 8.5* 8.2*   Recent Labs    04/22/17 1100  INR 1.18    EXAM General - Patient is Alert, Appropriate and Oriented Extremity - Neurovascular intact Sensation intact distally Intact pulses distally Dorsiflexion/Plantar flexion intact Dressing - dressing C/D/I Motor Function - intact, moving foot and toes well on exam.  Hemovac pulled without difficulty.  Past Medical History:  Diagnosis Date  . Abnormal weight loss   . Anxiety    PTSD per patient  . Celiac disease   . Cervical neck pain with  evidence of disc disease    patient has a cyst   . Degenerative disc disease at L5-S1 level   . Family history of adverse reaction to anesthesia    family has problems with anesthesia of nausea and vomiting   . Gender bias    prefers Male gender identity- "remains with vagina"  . Gluten enteropathy   . H/O parotitis    right   . H/O protein C deficiency    Dr. Anne Fu  . History of kidney stones   . Hx-TIA (transient ischemic attack)    2015  . Neck pain   . Neuromuscular disorder ([place])    bilateral neuropathy feet.  . Polycythemia, secondary   . PONV (postoperative nausea and vomiting)   . Psoriasis   . psoriatic arthritis   . Sleep apnea    cpap use- Dr. [name] follows  . Stroke [facility])    Stroke left side -slight right sided weakness-Dr. [name] follows  . 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 Tachycardia    hx of   . Transfusion history    past history- none recent  . Transgendered     Assessment/Plan: 1 Day Post-Op Procedure(s) (LRB): Left knee polyethylene revision (Left) Active Problems:   Failed total knee arthroplasty (HCC)  Estimated body mass index is  35.15 kg/m as calculated from the following:   Height as of this encounter: 5' 9"  (1.753 m).   Weight as of this encounter: 108 kg (238 lb). Advance diet Up with therapy  DVT Prophylaxis - Xarelto Weight-Bearing as tolerated to left leg D/C O2 and Pulse OX and try on Room Air  If meets goals and able to go home: Up with therapy Diet - Cardiac diet Follow up - in 2 weeks Activity - WBAT Disposition - Home Condition Upon Discharge - pending therapy D/C Meds - See DC Summary DVT Prophylaxis - [place], PA-C Orthopaedic Surgery

## 2017-04-29 ENCOUNTER — Encounter: Payer: Self-pay | Admitting: Physical Medicine & Rehabilitation

## 2017-05-03 NOTE — Discharge Summary (Signed)
Physician Discharge Summary   Patient ID: Jesse Bowers MRN: [id number] DOB/AGE: 1965-06-23 52 y.o.  Admit date: 04/22/2017 Discharge date: 04/23/2017  Primary Diagnosis:  Unstable left total knee arthroplasty.   Admission Diagnoses:  Past Medical History:  Diagnosis Date  . Abnormal weight loss   . Anxiety    PTSD per patient  . Celiac disease   . Cervical neck pain with evidence of disc disease    patient has a cyst   . Degenerative disc disease at L5-S1 level   . Family history of adverse reaction to anesthesia    family has problems with anesthesia of nausea and vomiting   . Gender bias    prefers Male gender identity- "remains with vagina"  . Gluten enteropathy   . H/O parotitis    right   . H/O protein C deficiency    Dr. Anne Fu  . History of kidney stones   . Hx-TIA (transient ischemic attack)    2015  . Neck pain   . Neuromuscular disorder ([place])    bilateral neuropathy feet.  . Polycythemia, secondary   . PONV (postoperative nausea and vomiting)   . Psoriasis   . psoriatic arthritis   . Sleep apnea    cpap use- Dr. [name] follows  . Stroke [facility])    Stroke left side -slight right sided weakness-Dr. [name] follows  . Syrinx of spinal cord ([place]) 01/06/2014   c spine on MRI  . Tachycardia    hx of   . Transfusion history    past history- none recent  . Transgendered    Discharge Diagnoses:   Active Problems:   Failed total knee arthroplasty ([place])  Estimated body mass index is 35.15 kg/m as calculated from the following:   Height as of this encounter: 5' 9"  (1.753 m).   Weight as of this encounter: 108 kg (238 lb).  Procedure:  Procedure(s) (LRB): Left knee polyethylene revision (Left)   Consults: None  HPI: Jesse Bowers is a 51 year old who had a total knee arthroplasty revision about a year ago.  He has had multiple falls and subsequent to that, the knee has become slightly unstable.  He is having symptoms from the instability.  He has not  responded well to bracing. He presents now for polyethylene versus total knee revision.   Laboratory Data: Admission on 04/22/2017, Discharged on 04/23/2017  Component Date Value Ref Range Status  . aPTT 04/22/2017 28  24 - 36 seconds Final   Performed at [facility], [place] [address] [place], [place] [address]  . WBC 04/22/2017 5.3  4.0 - 10.5 K/uL Final  . RBC 04/22/2017 4.64  4.22 - 5.81 MIL/uL Final  . Hemoglobin 04/22/2017 10.8* 13.0 - 17.0 g/dL Final  . HCT 04/22/2017 35.1* 39.0 - 52.0 % Final  . MCV 04/22/2017 75.6* 78.0 - 100.0 fL Final  . MCH 04/22/2017 23.3* 26.0 - 34.0 pg Final  . MCHC 04/22/2017 30.8  30.0 - 36.0 g/dL Final  . RDW 04/22/2017 17.0* 11.5 - 15.5 % Final  . Platelets 04/22/2017 180  150 - 400 K/uL Final   Performed at [facility], [place] [address] [place], [place] [address]  . Sodium 04/22/2017 137  135 - 145 mmol/L Final  . Potassium 04/22/2017 3.7  3.5 - 5.1 mmol/L Final  . Chloride 04/22/2017 104  101 - 111 mmol/L Final  . CO2 04/22/2017 24  22 - 32 mmol/L Final  . Glucose, Bld 04/22/2017 95  65 -  99 mg/dL Final  . BUN 04/22/2017 18  6 - 20 mg/dL Final  . Creatinine, Ser 04/22/2017 1.09  0.61 - 1.24 mg/dL Final  . Calcium 04/22/2017 8.5* 8.9 - 10.3 mg/dL Final  . Total Protein 04/22/2017 6.5  6.5 - 8.1 g/dL Final  . Albumin 04/22/2017 3.9  3.5 - 5.0 g/dL Final  . AST 04/22/2017 49* 15 - 41 U/L Final  . ALT 04/22/2017 80* 17 - 63 U/L Final  . Alkaline Phosphatase 04/22/2017 261* 38 - 126 U/L Final  . Total Bilirubin 04/22/2017 0.6  0.3 - 1.2 mg/dL Final  . GFR calc non Af Amer 04/22/2017 >60  >60 mL/min Final  . GFR calc Af Amer 04/22/2017 >60  >60 mL/min Final   Comment: (NOTE) The eGFR has been calculated using the CKD EPI equation. This calculation has not been validated in all clinical situations. eGFR's persistently <60 mL/min signify possible Chronic Kidney Disease.   Georgiann Hahn gap 04/22/2017 9  5 - 15 Final    Performed at [facility], [place] [address] [place], [place] [address]  . Prothrombin Time 04/22/2017 14.9  11.4 - 15.2 seconds Final  . INR 04/22/2017 1.18   Final   Performed at [facility], [place] [address] [place], [name] [address]  . ABO/RH(D) 04/22/2017 A POS   Final  . Antibody Screen 04/22/2017 NEG   Final  . Sample Expiration 04/22/2017    Final                   Value:04/25/2017 Performed at [facility], [place] [address], [place], [place] [address]   . MRSA, PCR 04/22/2017 NEGATIVE  NEGATIVE Final  . Staphylococcus aureus 04/22/2017 NEGATIVE  NEGATIVE Final   Comment: (NOTE) The Xpert SA Assay (FDA approved for NASAL specimens in patients 19 years of age and older), is one component of a comprehensive surveillance program. It is not intended to diagnose infection nor to guide or monitor treatment. Performed at [facility], [place] [address] [place], [place] [address]   . WBC 04/23/2017 7.1  4.0 - 10.5 K/uL Final  . RBC 04/23/2017 4.06* 4.22 - 5.81 MIL/uL Final  . Hemoglobin 04/23/2017 9.5* 13.0 - 17.0 g/dL Final  . HCT 04/23/2017 31.1* 39.0 - 52.0 % Final  . MCV 04/23/2017 76.6* 78.0 - 100.0 fL Final  . MCH 04/23/2017 23.4* 26.0 - 34.0 pg Final  . MCHC 04/23/2017 30.5  30.0 - 36.0 g/dL Final  . RDW 04/23/2017 16.9* 11.5 - 15.5 % Final  . Platelets 04/23/2017 193  150 - 400 K/uL Final   Performed at [facility], [place] [address], [place], [place] [address]  . Sodium 04/23/2017 138  135 - 145 mmol/L Final  . Potassium 04/23/2017 4.3  3.5 - 5.1 mmol/L Final  . Chloride 04/23/2017 105  101 - 111 mmol/L Final  . CO2 04/23/2017 26  22 - 32 mmol/L Final  . Glucose, Bld 04/23/2017 139* 65 - 99 mg/dL Final  . BUN 04/23/2017 18  6 - 20 mg/dL Final  . Creatinine, Ser 04/23/2017 1.03  0.61 - 1.24 mg/dL Final  . Calcium 04/23/2017 8.2* 8.9 - 10.3 mg/dL Final  . GFR calc non Af Amer  04/23/2017 >60  >60 mL/min Final  . GFR calc Af Amer 04/23/2017 >60  >60 mL/min Final   Comment: (NOTE) The eGFR has been calculated using the CKD EPI equation. This calculation has not been validated in  all clinical situations. eGFR's persistently <60 mL/min signify possible Chronic Kidney Disease.   Georgiann Hahn gap 04/23/2017 7  5 - 15 Final   Performed at [facility], [place] [address] [place], [place] [address]  Appointment on 04/09/2017  Component Date Value Ref Range Status  . WBC 04/09/2017 3.6* 4.0 - 10.3 K/uL Final  . RBC 04/09/2017 5.07  4.20 - 5.82 MIL/uL Final  . Hemoglobin 04/09/2017 11.8* 13.0 - 17.1 g/dL Final  . HCT 04/09/2017 39.1  38.4 - 49.9 % Final  . MCV 04/09/2017 77.1* 79.3 - 98.0 fL Final  . MCH 04/09/2017 23.3* 27.2 - 33.4 pg Final  . MCHC 04/09/2017 30.2* 32.0 - 36.0 g/dL Final  . RDW 04/09/2017 16.9* 11.0 - 14.6 % Final  . Platelets 04/09/2017 154  140 - 400 K/uL Final  . Neutrophils Relative % 04/09/2017 67  % Final  . Neutro Abs 04/09/2017 2.4  1.5 - 6.5 K/uL Final  . Lymphocytes Relative 04/09/2017 27  % Final  . Lymphs Abs 04/09/2017 1.0  0.9 - 3.3 K/uL Final  . Monocytes Relative 04/09/2017 6  % Final  . Monocytes Absolute 04/09/2017 0.2  0.1 - 0.9 K/uL Final  . Eosinophils Relative 04/09/2017 0  % Final  . Eosinophils Absolute 04/09/2017 0.0  0.0 - 0.5 K/uL Final  . Basophils Relative 04/09/2017 0  % Final  . Basophils Absolute 04/09/2017 0.0  0.0 - 0.1 K/uL Final   Performed at [facility], [place] [address], [place], [place] [address]  Office Visit on 03/24/2017  Component Date Value Ref Range Status  . Alcohol Metabolite 03/24/2017 NEGATIVE  <25 ng/mL Final   Comment: . For additional information, please refer to http://education.questdiagnostics.com/faq/FAQ186 (This link is being provided for informational/ educational purposes only.) . This drug testing is for medical treatment only. Analysis was  performed as non-forensic testing and these results should be used only by healthcare providers to render diagnosis or treatment, or to monitor progress of medical conditions. . For assistance with interpreting these drug results, please contact a [employer] Toxicology Specialist: [phone] [place] [phone]), M-F, 8am-6pm EST. [name] These tests were developed and their analytical performance characteristics have been determined by [facility]. They have not been cleared or approved by the FDA. These assays have been validated pursuant to the CLIA regulations and are used for clinical purposes.   . Amphetamines 03/24/2017 NEGATIVE  <10 ng/mL Final  . Barbiturates 03/24/2017 NEGATIVE  <10 ng/mL Final  . Benzodiazepines 03/24/2017 POSITIVE* <0.50 ng/mL Final  . Alprazolam 03/24/2017 Negative  <0.50 ng/mL Final  . Chlordiazepoxide 03/24/2017 Negative  <0.50 ng/mL Final  . Clonazepam 03/24/2017 Negative  <0.50 ng/mL Final  . Diazepam 03/24/2017 Negative  <0.50 ng/mL Final  . Flunitrazepam 03/24/2017 Negative  <0.50 ng/mL Final  . Flurazepam 03/24/2017 Negative  <0.50 ng/mL Final  . Lorazepam 03/24/2017 Negative  <0.50 ng/mL Final  . Midazolam 03/24/2017 Negative  <0.50 ng/mL Final  . Nordiazepam 03/24/2017 15.59* <0.50 ng/mL Final   Comment: . Nordiazepam is a metabolite of chlordiazepoxide and diazepam.   . Oxazepam 03/24/2017 0.98* <0.50 ng/mL Final   Comment: . Oxazepam is a metabolite of chlordiazepoxide, diazepam, nordiazepam and temazepam. Oxazepam is also a prescribed drug.   . Temazepam 03/24/2017 Negative  <0.50 ng/mL Final  . Triazolam 03/24/2017 Negative  <0.50 ng/mL Final  . Buprenorphine 03/24/2017 NEGATIVE CONFIRMED  <0.10 ng/mL Final  . Buprenorphine 03/24/2017 Negative  <0.10 ng/mL Final  . Naloxone 03/24/2017 Negative  <  0.25 ng/mL Final  . Norbuprenorphine 03/24/2017 Negative  <0.50 ng/mL Final  . Cocaine 03/24/2017 NEGATIVE  <5.0 ng/mL Final  .  Fentanyl 03/24/2017 NEGATIVE  <0.10 ng/mL Final  . Heroin Metabolite 03/24/2017 NEGATIVE  <1.0 ng/mL Final  . MARIJUANA 03/24/2017 NEGATIVE  <2.5 ng/mL Final  . MDMA 03/24/2017 NEGATIVE  <10 ng/mL Final  . Meprobamate 03/24/2017 NEGATIVE  <2.5 ng/mL Final  . Methadone 03/24/2017 NEGATIVE  <5.0 ng/mL Final  . Nicotine Metabolite 03/24/2017 NEGATIVE  <5.0 ng/mL Final  . Opiates 03/24/2017 POSITIVE* <2.5 ng/mL Final  . Codeine 03/24/2017 2.6* <2.5 ng/mL Final   Comment: . Codeine is an impurity in morphine manufacturing (up to 0.5%) and in heroin production, as well as a prescribed drug. Codeine also can be present due to poppy seed consumption. .   . Dihydrocodeine 03/24/2017 Negative  <2.5 ng/mL Final  . Hydrocodone 03/24/2017 Negative  <2.5 ng/mL Final  . Hydromorphone 03/24/2017 Negative  <2.5 ng/mL Final  . Morphine 03/24/2017 81.0* <2.5 ng/mL Final   Comment: . Morphine is a metabolite of codeine as well as a prescribed drug, and also can be observed following ingestion of products containing poppy seeds. .   . Norhydrocodone 03/24/2017 Negative  <2.5 ng/mL Final  . Noroxycodone 03/24/2017 66.9* <2.5 ng/mL Final   Comment: . Noroxycodone is a metabolite of oxycodone.   . Oxycodone 03/24/2017 228.4* <2.5 ng/mL Final  . Oxymorphone 03/24/2017 4.0* <2.5 ng/mL Final   Comment: . Oxymorphone is a metabolite of oxycodone as well as a prescribed drug.   [name] Phencyclidine 03/24/2017 NEGATIVE  <10 ng/mL Final  . Tapentadol 03/24/2017 NEGATIVE  <5.0 ng/mL Final  . Tramadol 03/24/2017 Interference  <5.0 ng/mL Final   Comment: The above test was performed, but the test results could not be interpreted due to non-specific interference.   . Zolpidem 03/24/2017 NEGATIVE  <5.0 ng/mL Final   Comment: . For additional information, please refer to http://education.questdiagnostics.com/faq/FAQ186 (This link is being provided for informational/ educational purposes only.) . This drug  testing is for medical treatment only. Analysis was performed as non-forensic testing and these results should be used only by healthcare providers to render diagnosis or treatment, or to monitor progress of medical conditions. . For assistance with interpreting these drug results, please contact a [employer] Toxicology Specialist: [phone] [place] [phone]), M-F, 8am-6pm EST. [name] These tests were developed and their analytical performance characteristics have been determined by [facility]. They have not been cleared or approved by the FDA. These assays have been validated pursuant to the CLIA regulations and are used for clinical purposes.   Office Visit on 03/17/2017  Component Date Value Ref Range Status  . Glucose 03/17/2017 142* 65 - 99 mg/dL Final  . BUN 03/17/2017 19  6 - 24 mg/dL Final  . Creatinine, Ser 03/17/2017 1.16  0.76 - 1.27 mg/dL Final  . GFR calc non Af Amer 03/17/2017 73  >59 mL/min/1.73 Final  . GFR calc Af Amer 03/17/2017 84  >59 mL/min/1.73 Final  . BUN/Creatinine Ratio 03/17/2017 16  9 - 20 Final  . Sodium 03/17/2017 138  134 - 144 mmol/L Final  . Potassium 03/17/2017 4.3  3.5 - 5.2 mmol/L Final  . Chloride 03/17/2017 101  96 - 106 mmol/L Final  . CO2 03/17/2017 21  20 - 29 mmol/L Final  . Calcium 03/17/2017 8.8  8.7 - 10.2 mg/dL Final  . Total Protein 03/17/2017 7.2  6.0 - 8.5 g/dL Final  . Albumin 03/17/2017 4.7  3.5 -  5.5 g/dL Final  . Globulin, Total 03/17/2017 2.5  1.5 - 4.5 g/dL Final  . Albumin/Globulin Ratio 03/17/2017 1.9  1.2 - 2.2 Final  . Bilirubin Total 03/17/2017 0.5  0.0 - 1.2 mg/dL Final  . Alkaline Phosphatase 03/17/2017 462* 39 - 117 IU/L Final  . AST 03/17/2017 79* 0 - 40 IU/L Final  . ALT 03/17/2017 73* 0 - 44 IU/L Final  . WBC 03/17/2017 3.7  3.4 - 10.8 x10E3/uL Final  . RBC 03/17/2017 5.25  4.14 - 5.80 x10E6/uL Final  . Hemoglobin 03/17/2017 12.1* 13.0 - 17.7 g/dL Final  . Hematocrit 03/17/2017 38.5  37.5 - 51.0  % Final  . MCV 03/17/2017 73* 79 - 97 fL Final  . MCH 03/17/2017 23.0* 26.6 - 33.0 pg Final  . MCHC 03/17/2017 31.4* 31.5 - 35.7 g/dL Final  . RDW 03/17/2017 16.3* 12.3 - 15.4 % Final  . Platelets 03/17/2017 197  150 - 379 x10E3/uL Final  . Magnesium 03/17/2017 2.2  1.6 - 2.3 mg/dL Final  . TSH 03/17/2017 4.980* 0.450 - 4.500 uIU/mL Final     X-Rays:No results found.  EKG: Orders placed or performed in visit on 03/17/17  . EKG 12-Lead     Hospital Course: Elroy Schembri is a 52 y.o. who was admitted to [facility]. They were brought to the operating room on 04/22/2017 and underwent Procedure(s): Left knee polyethylene revision.  Patient tolerated the procedure well and was later transferred to the recovery room and then to the orthopaedic floor for postoperative care.  They were given PO and IV analgesics for pain control following their surgery.  They were given 24 hours of postoperative antibiotics of  Anti-infectives (From admission, onward)   Start     Dose/Rate Route Frequency Ordered Stop   04/22/17 1900  clindamycin (CLEOCIN) IVPB 600 mg     600 mg 100 mL/hr over 30 Minutes Intravenous Every 6 hours 04/22/17 1710 04/23/17 0135   04/22/17 1037  clindamycin (CLEOCIN) IVPB 900 mg     900 mg 100 mL/hr over 30 Minutes Intravenous On call to O.R. 04/22/17 1037 04/22/17 1321     and started on DVT prophylaxis in the form of Xarelto.   PT and OT were ordered for total joint protocol.  Discharge planning consulted to help with postop disposition and equipment needs.  Patient had a good night on the evening of surgery.  They started to get up OOB with therapy on day one. Hemovac drain was pulled without difficulty.  Dressing was checked and looked clean and dry. Patient was seen in rounds, got up with therapy and was ready to go home later that same day.  Diet - Cardiac diet Follow up - in 2 weeks Activity - WBAT Disposition - Home Condition Upon Discharge - stable D/C Meds -  See DC Summary DVT Prophylaxis - Xarelto     Discharge Instructions    Call MD / Call 911   Complete by:  As directed    If you experience chest pain or shortness of breath, CALL 911 and be transported to the hospital emergency room.  If you develope a fever above 101 F, pus (white drainage) or increased drainage or redness at the wound, or calf pain, call your surgeon's office.   Change dressing   Complete by:  As directed    Change dressing daily with sterile 4 x 4 inch gauze dressing and apply TED hose. Do not submerge the incision under water.   Constipation  Prevention   Complete by:  As directed    Drink plenty of fluids.  Prune juice may be helpful.  You may use a stool softener, such as Colace (over the counter) 100 mg twice a day.  Use MiraLax (over the counter) for constipation as needed.   Diet - low sodium heart healthy   Complete by:  As directed    Discharge instructions   Complete by:  As directed    Resume the Xarelto 20 mg dosing at home starting tomorrow (Friday) 04/24/2017  Pick up stool softner and laxative for home use following surgery while on pain medications. Do not submerge incision under water. Please use good hand washing techniques while changing dressing each day. May shower starting three days after surgery. Please use a clean towel to pat the incision dry following showers. Continue to use ice for pain and swelling after surgery. Do not use any lotions or creams on the incision until instructed by your surgeon.  Wear both TED hose on both legs during the day every day for three weeks, but may remove the TED hose at night at home.  Postoperative Constipation Protocol  Constipation - defined medically as fewer than three stools per week and severe constipation as less than one stool per week.  One of the most common issues patients have following surgery is constipation.  Even if you have a regular bowel pattern at home, your normal regimen is likely to  be disrupted due to multiple reasons following surgery.  Combination of anesthesia, postoperative narcotics, change in appetite and fluid intake all can affect your bowels.  In order to avoid complications following surgery, here are some recommendations in order to help you during your recovery period.  Colace (docusate) - Pick up an over-the-counter form of Colace or another stool softener and take twice a day as long as you are requiring postoperative pain medications.  Take with a full glass of water daily.  If you experience loose stools or diarrhea, hold the colace until you stool forms back up.  If your symptoms do not get better within 1 week or if they get worse, check with your doctor.  Dulcolax (bisacodyl) - Pick up over-the-counter and take as directed by the product packaging as needed to assist with the movement of your bowels.  Take with a full glass of water.  Use this product as needed if not relieved by Colace only.   MiraLax (polyethylene glycol) - Pick up over-the-counter to have on hand.  MiraLax is a solution that will increase the amount of water in your bowels to assist with bowel movements.  Take as directed and can mix with a glass of water, juice, soda, coffee, or tea.  Take if you go more than two days without a movement. Do not use MiraLax more than once per day. Call your doctor if you are still constipated or irregular after using this medication for 7 days in a row.  If you continue to have problems with postoperative constipation, please contact the office for further assistance and recommendations.  If you experience "the worst abdominal pain ever" or develop nausea or vomiting, please contact the office immediatly for further recommendations for treatment.   Do not put a pillow under the knee. Place it under the heel.   Complete by:  As directed    Do not sit on low chairs, stoools or toilet seats, as it may be difficult to get up from low surfaces   Complete  by:  As  directed    Driving restrictions   Complete by:  As directed    No driving until released by the physician.   Increase activity slowly as tolerated   Complete by:  As directed    Lifting restrictions   Complete by:  As directed    No lifting until released by the physician.   Patient may shower   Complete by:  As directed    You may shower without a dressing once there is no drainage.  Do not wash over the wound.  If drainage remains, do not shower until drainage stops.   TED hose   Complete by:  As directed    Use stockings (TED hose) for 3 weeks on both leg(s).  You may remove them at night for sleeping.   Weight bearing as tolerated   Complete by:  As directed    Laterality:  left   Extremity:  Lower     Allergies as of 04/23/2017      Reactions   Penicillin G Anaphylaxis, Other (See Comments)   Has patient had a PCN reaction causing immediate rash, facial/tongue/throat swelling, SOB or lightheadedness with hypotension: Yes Has patient had a PCN reaction causing severe rash involving mucus membranes or skin necrosis: No Has patient had a PCN reaction that required hospitalization Yes Has patient had a PCN reaction occurring within the last 10 years: No If all of the above answers are "NO", then may proceed with Cephalosporin use.   Vancomycin Rash, Other (See Comments)   RED MAN SYNDROME CAN HAVE IF GIVEN OVER 2HOURS   Duloxetine Other (See Comments)   Restless legs   Gabapentin Nausea Only, Other (See Comments)   Other reaction(s): nausea, mental status   Acetaminophen Other (See Comments)   Elevates liver enzymes.    Ibuprofen Other (See Comments)   Contraindicated with Xarelto.    Nortriptyline Other (See Comments)   Dry mouth at 25 mg dose.  Tolerates 10 mg dose   Pregabalin Other (See Comments)   Ineffective   Sulfa Antibiotics Rash, Other (See Comments)   Stevens-Johnson rash   Sulfacetamide Sodium-sulfur Rash      Medication List    STOP taking these  medications   clindamycin 150 MG capsule Commonly known as:  CLEOCIN     TAKE these medications   baclofen 20 MG tablet Commonly known as:  LIORESAL Take 1 tablet (20 mg total) by mouth 3 (three) times daily.   betamethasone dipropionate 0.05 % cream Commonly known as:  DIPROLENE Apply topically 2 (two) times daily.   clorazepate 7.5 MG tablet Commonly known as:  TRANXENE Take 7.5 mg by mouth 2 (two) times daily.   clorazepate 15 MG tablet Commonly known as:  TRANXENE Take 15 mg by mouth at bedtime.   desonide 0.05 % ointment Commonly known as:  DESOWEN APPLY TO AFFECTED AREAS TWICE DAILY AS NEEDED FOR PSORIASIS.   ENSTILAR 0.005-0.064 % Foam Generic drug:  Calcipotriene-Betameth Diprop Apply 1 application topically 2 (two) times daily.   furosemide 20 MG tablet Commonly known as:  LASIX Take 1 tablet (20 mg total) by mouth daily as needed for fluid or edema. What changed:  reasons to take this   methocarbamol 500 MG tablet Commonly known as:  ROBAXIN Take 1 tablet (500 mg total) by mouth every 6 (six) hours as needed for muscle spasms.   morphine 60 MG 24 hr capsule Commonly known as:  KADIAN Take 1 capsule (60 mg  total) by mouth every 12 (twelve) hours.   neomycin-polymyxin-hydrocortisone OTIC solution Commonly known as:  CORTISPORIN 4 (four) times daily.   neomycin-polymyxin-hydrocortisone 3.5-10000-1 OTIC suspension Commonly known as:  CORTISPORIN PLACE 4 DROPS INTO LEFT EAR TWICE A DAY AS NEEDED FOR PAIN AND DRAINAGE   NONFORMULARY OR COMPOUNDED ITEM Apply 1 application topically 3 (three) times daily. 8% ketamine, 5% amitriptyline, 5% baclofen, 5% gabapentin  60 GM What changed:    when to take this  reasons to take this  additional instructions   nortriptyline 10 MG capsule Commonly known as:  PAMELOR TAKE 1 TO 2 CAPSULES AT BEDTIME What changed:  See the new instructions.   ondansetron 4 MG tablet Commonly known as:  ZOFRAN Take 1 tablet  (4 mg total) by mouth every 8 (eight) hours as needed for nausea or vomiting.   Oxycodone HCl 10 MG Tabs Take 1-2 tablets (10-20 mg total) by mouth every 4 (four) hours as needed for moderate pain or severe pain ((score 7 to 10)). What changed:    how much to take  when to take this  reasons to take this   potassium chloride 10 MEQ tablet Commonly known as:  K-DUR Take 1 tablet (10 mEq total) by mouth daily as needed. Take every time you take the Lasix (furosemide).   prazosin 1 MG capsule Commonly known as:  MINIPRESS Take 1 mg by mouth at bedtime. Takes 1 mg and 2 mg to equal 3 mg at bedtime   prazosin 2 MG capsule Commonly known as:  MINIPRESS Take 2 mg by mouth at bedtime. Takes 1 mg and 2 mg to equal 3 mg at bedtime   testosterone cypionate 200 MG/ML injection Commonly known as:  DEPO-TESTOSTERONE Inject 0.25 mLs (50 mg total) into the muscle once a week. What changed:    how much to take  additional instructions   ustekinumab 90 MG/ML Sosy injection Commonly known as:  STELARA Inject 90 mg into the skin every 3 (three) months. Last dose March 07, 2017   venlafaxine XR 150 MG 24 hr capsule Commonly known as:  EFFEXOR-XR Take 300 mg by mouth daily with breakfast.   XARELTO 20 MG Tabs tablet Generic drug:  rivaroxaban TAKE 1 TABLET DAILY WITH SUPPER What changed:  See the new instructions.            Discharge Care Instructions  (From admission, onward)        Start     Ordered   04/23/17 0000  Weight bearing as tolerated    Question Answer Comment  Laterality left   Extremity Lower      04/23/17 0803   04/23/17 0000  Change dressing    Comments:  Change dressing daily with sterile 4 x 4 inch gauze dressing and apply TED hose. Do not submerge the incision under water.   04/23/17 0803     Follow-up Information    [facility] up.   Specialty:  Jud Why:  physical therapy Contact information: [address] [place]  [phone]           Signed: Arlee Muslim, PA-C Orthopaedic Surgery 05/03/2017, 7:41 AM

## 2017-05-07 ENCOUNTER — Telehealth: Payer: Self-pay | Admitting: *Deleted

## 2017-05-07 NOTE — Telephone Encounter (Signed)
Patient reports pain is stronger on right side.  Past injections have been on the left side. He is hoping for Dr. [name] to give approval so Dr. Letta Pate can give him a back injection.  He was hoping to get this done sooner than later.  I informed that Dr. [name]  had been out of all week on vacation and that Dr. Letta Pate is currently out on vacation.  He understood and agreed that he would discuss this with Dr. [name] at his next appt

## 2017-05-12 ENCOUNTER — Ambulatory Visit: Payer: BLUE CROSS/BLUE SHIELD | Admitting: Psychology

## 2017-05-12 ENCOUNTER — Ambulatory Visit
Admission: RE | Admit: 2017-05-12 | Discharge: 2017-05-12 | Disposition: A | Discharge status: Home / Self Care | Payer: BLUE CROSS/BLUE SHIELD | Source: Ambulatory Visit | Attending: Orthopedic Surgery | Admitting: Orthopedic Surgery

## 2017-05-12 ENCOUNTER — Other Ambulatory Visit: Payer: Self-pay | Admitting: Orthopedic Surgery

## 2017-05-12 DIAGNOSIS — M79662 Pain in left lower leg: Secondary | ICD-10-CM

## 2017-05-14 ENCOUNTER — Ambulatory Visit: Payer: BLUE CROSS/BLUE SHIELD | Admitting: Psychology

## 2017-05-15 ENCOUNTER — Ambulatory Visit (INDEPENDENT_AMBULATORY_CARE_PROVIDER_SITE_OTHER): Payer: BLUE CROSS/BLUE SHIELD | Admitting: Psychology

## 2017-05-15 DIAGNOSIS — F4310 Post-traumatic stress disorder, unspecified: Secondary | ICD-10-CM | POA: Diagnosis not present

## 2017-05-16 ENCOUNTER — Encounter: Payer: Self-pay | Admitting: Physician Assistant

## 2017-05-17 ENCOUNTER — Encounter: Payer: Self-pay | Admitting: Registered Nurse

## 2017-05-19 ENCOUNTER — Other Ambulatory Visit: Payer: Self-pay | Admitting: Physical Medicine & Rehabilitation

## 2017-05-19 ENCOUNTER — Ambulatory Visit: Payer: BLUE CROSS/BLUE SHIELD | Admitting: Psychology

## 2017-05-19 DIAGNOSIS — M792 Neuralgia and neuritis, unspecified: Secondary | ICD-10-CM

## 2017-05-19 DIAGNOSIS — F4312 Post-traumatic stress disorder, chronic: Secondary | ICD-10-CM | POA: Diagnosis not present

## 2017-05-20 ENCOUNTER — Encounter: Payer: Self-pay | Admitting: Physical Medicine & Rehabilitation

## 2017-05-20 ENCOUNTER — Telehealth: Payer: Self-pay | Admitting: Physical Medicine & Rehabilitation

## 2017-05-20 ENCOUNTER — Encounter
Payer: BLUE CROSS/BLUE SHIELD | Attending: Physical Medicine & Rehabilitation | Admitting: Physical Medicine & Rehabilitation

## 2017-05-20 VITALS — BP 119/85 | HR 97

## 2017-05-20 DIAGNOSIS — R252 Cramp and spasm: Secondary | ICD-10-CM | POA: Diagnosis not present

## 2017-05-20 DIAGNOSIS — S43002A Unspecified subluxation of left shoulder joint, initial encounter: Secondary | ICD-10-CM | POA: Diagnosis not present

## 2017-05-20 DIAGNOSIS — M5416 Radiculopathy, lumbar region: Secondary | ICD-10-CM | POA: Diagnosis not present

## 2017-05-20 DIAGNOSIS — M545 Low back pain: Secondary | ICD-10-CM | POA: Diagnosis not present

## 2017-05-20 DIAGNOSIS — K900 Celiac disease: Secondary | ICD-10-CM | POA: Diagnosis not present

## 2017-05-20 DIAGNOSIS — M7522 Bicipital tendinitis, left shoulder: Secondary | ICD-10-CM | POA: Insufficient documentation

## 2017-05-20 DIAGNOSIS — Z87890 Personal history of sex reassignment: Secondary | ICD-10-CM | POA: Insufficient documentation

## 2017-05-20 DIAGNOSIS — Z79891 Long term (current) use of opiate analgesic: Secondary | ICD-10-CM | POA: Diagnosis not present

## 2017-05-20 DIAGNOSIS — F418 Other specified anxiety disorders: Secondary | ICD-10-CM | POA: Insufficient documentation

## 2017-05-20 DIAGNOSIS — G8929 Other chronic pain: Secondary | ICD-10-CM | POA: Insufficient documentation

## 2017-05-20 DIAGNOSIS — D6859 Other primary thrombophilia: Secondary | ICD-10-CM | POA: Insufficient documentation

## 2017-05-20 DIAGNOSIS — Z79899 Other long term (current) drug therapy: Secondary | ICD-10-CM | POA: Insufficient documentation

## 2017-05-20 DIAGNOSIS — M13862 Other specified arthritis, left knee: Secondary | ICD-10-CM | POA: Diagnosis not present

## 2017-05-20 DIAGNOSIS — D751 Secondary polycythemia: Secondary | ICD-10-CM | POA: Insufficient documentation

## 2017-05-20 DIAGNOSIS — G950 Syringomyelia and syringobulbia: Secondary | ICD-10-CM | POA: Diagnosis not present

## 2017-05-20 DIAGNOSIS — R209 Unspecified disturbances of skin sensation: Secondary | ICD-10-CM | POA: Diagnosis not present

## 2017-05-20 DIAGNOSIS — Z86718 Personal history of other venous thrombosis and embolism: Secondary | ICD-10-CM | POA: Diagnosis not present

## 2017-05-20 DIAGNOSIS — I69398 Other sequelae of cerebral infarction: Secondary | ICD-10-CM | POA: Diagnosis not present

## 2017-05-20 DIAGNOSIS — Z7901 Long term (current) use of anticoagulants: Secondary | ICD-10-CM | POA: Insufficient documentation

## 2017-05-20 DIAGNOSIS — T84018S Broken internal joint prosthesis, other site, sequela: Secondary | ICD-10-CM | POA: Diagnosis not present

## 2017-05-20 DIAGNOSIS — I69320 Aphasia following cerebral infarction: Secondary | ICD-10-CM | POA: Diagnosis not present

## 2017-05-20 DIAGNOSIS — I69351 Hemiplegia and hemiparesis following cerebral infarction affecting right dominant side: Secondary | ICD-10-CM | POA: Insufficient documentation

## 2017-05-20 DIAGNOSIS — Z9181 History of falling: Secondary | ICD-10-CM | POA: Diagnosis not present

## 2017-05-20 DIAGNOSIS — Z96659 Presence of unspecified artificial knee joint: Secondary | ICD-10-CM

## 2017-05-20 DIAGNOSIS — L4050 Arthropathic psoriasis, unspecified: Secondary | ICD-10-CM

## 2017-05-20 MED ORDER — BACLOFEN 20 MG PO TABS
20.0000 mg | ORAL_TABLET | Freq: Four times a day (QID) | ORAL | 3 refills | Status: DC
Start: 2017-05-20 — End: 2017-09-14

## 2017-05-20 MED ORDER — OXYCODONE HCL 10 MG PO TABS: 10.0000 mg | ORAL_TABLET | ORAL | 0 refills | Status: DC | PRN

## 2017-05-20 MED ORDER — MORPHINE SULFATE ER 60 MG PO CP24: 60.0000 mg | ORAL_CAPSULE | Freq: Two times a day (BID) | ORAL | 0 refills | Status: DC

## 2017-05-20 NOTE — Patient Instructions (Signed)
PLEASE FEEL FREE TO CALL OUR OFFICE WITH ANY PROBLEMS OR QUESTIONS ([phone])      

## 2017-05-20 NOTE — Progress Notes (Signed)
Subjective:    Patient ID: Jesse Bowers, adult    DOB: [birth year], 52 y.o.   MRN: [id number]  HPI   [name] here in follow-up of his chronic pain and gait disorder.  He has ongoing concerns about the numbness weakness in his arms which remains a problem especially at nighttime and positionally.  He was told by another provider that perhaps he needed his syrinx assessed again.  We reviewed his MRI from February 2018 in detail again at the office today which showed no substantial change from 2017.  He received his new power chair and off for the most part it is working for him.  However he has 1 unified leg lift and he is unable to adjust the legs independently.  This is has caused problems for him in regards to edema, management of his pain.  He develops compression neuropathies easily and when he is unable to adjust from side to side differently it is difficult for him to adjust in the chair.  He remains on Kadian 60 mg every 12 hours with oxycodone 10 mg every 4 hours as needed.  It has been a month since his left total knee revision by orthopedic surgery.  He still dealing with some of the postsurgical sequelae related to this.  He is hopeful that he will have improved function in the knee this time.  Pain Inventory Average Pain 9 Pain Right Now 8 My pain is constant, sharp, dull and aching  In the last 24 hours, has pain interfered with the following? General activity 10 Relation with others 10 Enjoyment of life 10 What TIME of day is your pain at its worst? daytime Sleep (in general) Poor  Pain is worse with: walking, bending, sitting, standing and some activites Pain improves with: medication, TENS and injections Relief from Meds: 7  Mobility walk without assistance ability to climb steps?  no do you drive?  yes use a wheelchair  Function disabled: date disabled . I need assistance with the following:  feeding, dressing, bathing, meal prep, household duties and  shopping  Neuro/Psych bladder control problems weakness numbness tingling trouble walking spasms anxiety  Prior Studies Any changes since last visit?  no  Physicians involved in your care Any changes since last visit?  no   Family History  Problem Relation Age of Onset  . Stroke Maternal Grandfather        34  . Heart attack Maternal Grandfather   . Hypertension Mother   . Psoriasis Mother   . Cancer Paternal Grandfather   . Heart attack Paternal Grandfather   . Stroke Paternal Uncle        age 24  . Stroke Maternal Grandmother   . Congestive Heart Failure Maternal Grandmother   . Heart attack Maternal Grandmother   . Protein C deficiency Sister 16       Miscarriages   Social History   Socioeconomic History  . Marital status: Married    Spouse name: None  . Number of children: 2  . Years of education: 4y college  . Highest education level: None  Social Needs  . Financial resource strain: None  . Food insecurity - worry: None  . Food insecurity - inability: None  . Transportation needs - medical: None  . Transportation needs - non-medical: None  Occupational History  . Occupation: Pediatric Nurse practitioner    Comment: Not working since [place] 2015  Tobacco Use  . Smoking status: Never Smoker  . Smokeless tobacco: Never  Used  Substance and Sexual Activity  . Alcohol use: No    Frequency: Never  . Drug use: No  . Sexual activity: Yes    Birth control/protection: None    Comment: patient is a transgender on testosterone shots, no biological kids  Other Topics Concern  . None  Social History Narrative   Education 4 year college, former Therapist, sports X 15 years, pediatric nurse practitioner x 6 years, did NP degree from [employer] [place]. Relocated to [place] about 2 months ago from [place], [place]. Patient was in [place] for last 4 years and prior to that in [place]. His wife is working as Scientist, research (physical sciences) for [employer]. Patient is not working and applying for  disability. They have 2 kids but no biologic children.    Past Surgical History:  Procedure Laterality Date  . ABDOMINAL HYSTERECTOMY     TAH, BSO- tranverse incision.  [name] ANKLE ARTHROSCOPY WITH RECONSTRUCTION Right 2007  . CHOLECYSTECTOMY     laparoscopic  . HIP ARTHROSCOPY W/ LABRAL REPAIR Right 05/11/2013  . KNEE SURGERY Bilateral 1984   Right ACL, left PCL repair  . LIVER BIOPSY  2013   normal results.  [name] NASAL SEPTUM SURGERY N/A 09/20/2015  . THYROIDECTOMY, PARTIAL  2008  . TOTAL KNEE REVISION Left 02/06/2016   Procedure: LEFT TOTAL KNEE REVISION;  Surgeon: [name], MD;  Location: [facility];  Service: Orthopedics;  Laterality: Left;  . TOTAL KNEE REVISION Left 04/22/2017   Procedure: Left knee polyethylene revision;  Surgeon: [name], MD;  Location: [facility];  Service: Orthopedics;  Laterality: Left;   Past Medical History:  Diagnosis Date  . Abnormal weight loss   . Anxiety    PTSD per patient  . Celiac disease   . Cervical neck pain with evidence of disc disease    patient has a cyst   . Degenerative disc disease at L5-S1 level   . Family history of adverse reaction to anesthesia    family has problems with anesthesia of nausea and vomiting   . Gender bias    prefers Male gender identity- "remains with vagina"  . Gluten enteropathy   . H/O parotitis    right   . H/O protein C deficiency    Dr. Anne Fu  . History of kidney stones   . Hx-TIA (transient ischemic attack)    2015  . Neck pain   . Neuromuscular disorder ([place])    bilateral neuropathy feet.  . Polycythemia, secondary   . PONV (postoperative nausea and vomiting)   . Psoriasis   . psoriatic arthritis   . Sleep apnea    cpap use- Dr. [name] follows  . Stroke [facility])    Stroke left side -slight right sided weakness-Dr. [name] follows  . 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 Tachycardia    hx of   . Transfusion history    past history- none recent  . Transgendered    BP  119/85   Pulse 97   SpO2 98%   Opioid Risk Score:   Fall Risk Score:  `1  Depression screen PHQ 2/9  Depression screen [facility] 2/9 02/18/2017 11/10/2016 09/15/2016  Decreased Interest 1 0 0  Down, Depressed, Hopeless 1 0 0  PHQ - 2 Score 2 0 0  Some recent data might be hidden     Review of Systems  Constitutional: Positive for appetite change.  HENT: Negative.   Eyes: Negative.   Respiratory:  Negative.   Cardiovascular: Negative.   Gastrointestinal: Negative.   Endocrine: Negative.   Genitourinary: Positive for difficulty urinating.  Musculoskeletal: Positive for arthralgias, back pain, gait problem, joint swelling and myalgias.  Skin: Negative.   Allergic/Immunologic: Negative.   Hematological: Bruises/bleeds easily.  Psychiatric/Behavioral: Negative.   All other systems reviewed and are negative.      Objective:   Physical Exam  General: No acute distress HEENT: EOMI, oral membranes moist Cards: reg rate  Chest: normal effort Abdomen: Soft, NT, ND Skin: dry, intact Extremities: no edema Skin:Clean and intact without signs of breakdown. Left TKA scar.  Neuro:Pt is pleasant and alert and oriented x 3. He stutters and has word finding deficits. He is able to converse intelligently and has good insight and awareness. His short term memory is functional on a conversational level. Minimalright facial weakness and sensory loss is appreciated. UES: 4+ to 5/5 LUE with some give away at the shoulder, biceps right more than left once again. RUE: 4- to 4+ deltoid, bicep, tricep, wrist and grip---stable exam today. RLE: 3+ to 4/5 hf, ke and ankle df. LE: 3+ to 4/5 prox to /5 distal .  Reflexes are hyperactive in both lower extremities without resting tone.  Sensory loss is unchanged in the right hand face and leg Musculoskeletal:shoulders with improved PROM and AROM. LB TTP .  Left knee was not examined. Psych:Pt's affect is appropriate. Pt is cooperative. Is distracted at  times   Assessment & Plan:  1. Psoriatic arthritis with pain in multiple areas, most prominently feet, hands, elbows.His pain is also related to his prior CVA and associated motor/sensory changes/spasticity. 2. 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  3. Hx of left total knee revision again on 04/21/17 per [facility] 4. 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 Left S1 radiculopathy? S/p LEFT L5-S1 ESI 5. Protein C deficiency  6. Left shoulder subluxation, bicipital tendonitis  7. Central sleep apnea  8. Polycythemia  9. Depression with anxiety. 10. Left lateral epidondylitis  11. 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 Discussed his neurological exam and recent imaging at length. I see no new reason to check another MRI of his cervical spine. He is having intermittent symptoms which could potentially be related to his numerous other diagnoses as well. I asked him to keep an eye on it, and if he sees further progression or more consistent symptoms we can re-visit. 2. Maintain HEP as possible  3. Voltaren gel to hands, feet, knees, elbows 4. Continue baclofen for lower extermity spasms/back pain.  Increase to 20 mg 4 times daily #120  5. Refilled kadian 66m q12 #60 and oxycodone 159m#90 today. . We will continue the opioid monitoring program, this consists of regular clinic visits, examinations, routine drug screening, pill counts as well [place] use [place] today.   6.maintain pamelor 1044mhs for sleep and neuropathic pain 7.  Referred back to Dr. KerBarbaraann Caor a right-sided L5-S1 translaminar epidural injection  W/C evaluation Note: Mr. ThoPlitteds individual power,elevating leg rests given ongoing neurological deficits, orthopedic injuries,and intermittent edema which  frequently affect each limb differently from day to day. He also has had to observe different orthopedic precautions and compression neuropathies which require offset loading of his hips and legs.  7m7mes of face to face patient care time  were spent during this visit. All questions were encouraged and answered. Follow up in a month  for epidural injections..  Greater than 50% of the time in this visit was spent reviewing his MRI and discussing etiologies for his motor and sensory symptoms.  He also discussed the need for modifications to his power wheelchair which are reviewed above.

## 2017-05-20 NOTE — Telephone Encounter (Signed)
[place] with [facility] needs a call back at [phone].

## 2017-05-21 ENCOUNTER — Telehealth: Payer: Self-pay | Admitting: Physical Medicine & Rehabilitation

## 2017-05-21 ENCOUNTER — Ambulatory Visit: Payer: BLUE CROSS/BLUE SHIELD | Admitting: Psychology

## 2017-05-21 DIAGNOSIS — F4310 Post-traumatic stress disorder, unspecified: Secondary | ICD-10-CM | POA: Diagnosis not present

## 2017-05-21 NOTE — Telephone Encounter (Signed)
Audrey called office and wants to ask if CBD Oil/Gel or drops are useful.  He has spoken to individuals who are achieving some level of success.  He has two questions - if it would help - to at a minimum help him wean down from some of the narcotics and two if the CBD would end up showing in his urine/swab and cause him to be terminated from the practice.  Please advise. He said can send message on My Chart if you like.

## 2017-05-22 ENCOUNTER — Encounter: Payer: Self-pay | Admitting: Physician Assistant

## 2017-05-22 ENCOUNTER — Telehealth: Payer: Self-pay

## 2017-05-22 NOTE — Telephone Encounter (Signed)
No CBD if using narcotics. This should be an office-wide policy. Please check with other MD's to see if they agree. thx

## 2017-05-22 NOTE — Telephone Encounter (Signed)
Received email from pt: I've gain almost 10 pounds this week in fluid only. It's evident in my legs, feet, hands and when Curt Bears saw me, I of course had none. I'm like a car to Insurance claims handler. No symptoms when arrive.  Yet unfortunately, I believe the lasix isn't helping and may be harming me. Both my mom and her mom were on lasix for HTN & CHF respectively. They ALWAYS had to find a bathroom when on it. And it lasted ALL DAY. I never have the feeling of extra ruination, urges to pee or anything else.  I'm on 63m if Lasix for 3 pound weight gain/ day or 5#/wk along w/ K-Chlor @10mEq . Yet I've been taking it about the last three days especially because the fluid has been worse & again noted by PT & PCP too I think noticed but didn't address. So is the lasix just not working. KCurt Bearsmentioned another medication that started with a D, I didn't treat adults or kids with lasix. So Idk name. But I think the lasix isn't working.   Please advise ASAP before weekend if possible.  CAreg [id number]

## 2017-05-25 NOTE — Telephone Encounter (Signed)
Please make an appointment with Rosaria Ferries PA or Dr. Loletha Grayer for follow up.

## 2017-05-25 NOTE — Telephone Encounter (Signed)
Note rhonda's other email- will have schedulers call to make appt

## 2017-05-26 ENCOUNTER — Ambulatory Visit: Payer: BLUE CROSS/BLUE SHIELD | Admitting: Psychology

## 2017-05-27 NOTE — Telephone Encounter (Signed)
Jesse Bowers (male) is in a class today.  They took message and if she still needs to speak with [place] she will call back.

## 2017-05-28 ENCOUNTER — Ambulatory Visit (INDEPENDENT_AMBULATORY_CARE_PROVIDER_SITE_OTHER): Payer: BLUE CROSS/BLUE SHIELD | Admitting: Psychology

## 2017-05-28 DIAGNOSIS — F4310 Post-traumatic stress disorder, unspecified: Secondary | ICD-10-CM | POA: Diagnosis not present

## 2017-05-29 ENCOUNTER — Ambulatory Visit: Payer: BLUE CROSS/BLUE SHIELD | Admitting: Physical Medicine & Rehabilitation

## 2017-05-29 ENCOUNTER — Ambulatory Visit: Payer: Self-pay | Admitting: Physical Medicine & Rehabilitation

## 2017-05-29 ENCOUNTER — Encounter: Payer: Self-pay | Admitting: Physical Medicine & Rehabilitation

## 2017-05-29 VITALS — BP 138/84 | HR 100 | Resp 14

## 2017-05-29 DIAGNOSIS — L4050 Arthropathic psoriasis, unspecified: Secondary | ICD-10-CM | POA: Diagnosis not present

## 2017-05-29 DIAGNOSIS — M5416 Radiculopathy, lumbar region: Secondary | ICD-10-CM

## 2017-05-29 NOTE — Patient Instructions (Signed)
You received an epidural steroid injection under fluoroscopic guidance. This is the most accurate way to perform an epidural injection. This injection was performed to relieve thigh or leg or foot pain that may be related to a pinched nerve in the lumbar spine. The local anesthetic injected today may cause numbness in your leg for a couple hours. If it is severe we may need to observe you for 30-60 minutes after the injection. The cortisone medicine injected today may take several days to take full effect. This medicine can also cause facial flushing or feeling of being warm.  This injection may last for days weeks or months. It can be repeated if needed. If it is not effective, another spinal level may need to be injected. Other treatments include medication management as well as physical therapy. In some cases surgery may be an option.  May resume Xarelto in am

## 2017-05-29 NOTE — Progress Notes (Signed)
  PROCEDURE RECORD [facility]   Name: Jesse Bowers DOB:1965-10-07 MRN: [id number]  Date:05/29/2017  Physician: Alysia Penna, MD    Nurse/CMA: Rhea Kaelin, CMA / Bright CMA  Allergies:  Allergies  Allergen Reactions  . Penicillin G Anaphylaxis and Other (See Comments)    Has patient had a PCN reaction causing immediate rash, facial/tongue/throat swelling, SOB or lightheadedness with hypotension: Yes Has patient had a PCN reaction causing severe rash involving mucus membranes or skin necrosis: No Has patient had a PCN reaction that required hospitalization Yes Has patient had a PCN reaction occurring within the last 10 years: No If all of the above answers are "NO", then may proceed with Cephalosporin use.   . Vancomycin Rash and Other (See Comments)    RED MAN SYNDROME CAN HAVE IF GIVEN OVER 2HOURS  . Duloxetine Other (See Comments)    Restless legs  . Gabapentin Nausea Only and Other (See Comments)    Other reaction(s): nausea, mental status  . Tegaderm Ag Mesh [Silver] Other (See Comments)    Causes blistering wounds   . Acetaminophen Other (See Comments)    Elevates liver enzymes.   . Ibuprofen Other (See Comments)    Contraindicated with Xarelto.   . Nortriptyline Other (See Comments)    Dry mouth at 25 mg dose.  Tolerates 10 mg dose  . Pregabalin Other (See Comments)    Ineffective  . Sulfa Antibiotics Rash and Other (See Comments)    Stevens-Johnson rash  . Sulfacetamide Sodium-Sulfur Rash    Consent Signed: Yes.    Is patient diabetic? No.  CBG today? NA  Pregnant: No. LMP: No LMP recorded. (age 82-55)  Anticoagulants: No, stopped Xarelto 05/25/2017 Anti-inflammatory: no Antibiotics: no  Procedure: right translaminar esi     Position: Prone   Start Time: 1108am       End Time: 1120am         Fluoro Time: 29s  RN/CMA Rondey Fallen, CMA Bright CMA    Time 10:34am 11:22am    BP 138/84 145/84    Pulse 100 87     Respirations 16 16    O2 Sat 97 97    S/S 6 6    Pain Level 6/10 4/10     D/C home with Dad, patient A & O X 3, D/C instructions reviewed, and sits independently.

## 2017-05-29 NOTE — Progress Notes (Signed)
Lumbar epidural steroid injection under fluoroscopic guidance  Indication:Right L5-S1 Lumbosacral radiculitis is not relieved by medication management or other conservative care and interfering with self-care and mobility.  No  anticoagulant use.  Informed consent was obtained after describing risk and benefits of the procedure with the patient, this includes bleeding, bruising, infection, paralysis and medication side effects.  The patient wishes to proceed and has given written consent.  Patient was placed in a prone position.  The lumbar area was marked and prepped with Betadine.  It was entered with a 25-gauge 1-1/2 inch needle and one mL of 1% lidocaine was injected into the skin and subcutaneous tissue.  Then a 17-gauge spinal needle was inserted under fluoroscopic guidance into the Right L5-S1 interlaminar space under AP and Lateral imaging.  Once needle tip of approximated the posterior elements, a loss of resistance technique was utilized with lateral imaging.  A positive loss of resistance was obtained and then confirmed by injecting 2 mL's of Omnipaque 180.  Then a solution containing 1.5 mL's of 61m/ml Celestone and 1.5 mL's of 1% lidocaine was injected.  The patient tolerated procedure well.  Post procedure instructions were given.  Please see post procedure form.

## 2017-06-02 ENCOUNTER — Ambulatory Visit: Payer: BLUE CROSS/BLUE SHIELD | Admitting: Physician Assistant

## 2017-06-02 ENCOUNTER — Ambulatory Visit: Payer: BLUE CROSS/BLUE SHIELD | Admitting: Psychology

## 2017-06-02 ENCOUNTER — Encounter: Payer: Self-pay | Admitting: Physician Assistant

## 2017-06-02 ENCOUNTER — Telehealth: Payer: Self-pay | Admitting: *Deleted

## 2017-06-02 NOTE — Telephone Encounter (Signed)
Appt rescheduled to 06-19-17 at 3:00 pm with Jesse Bowers at pt's request the patient is running a fever and not feeling well./cy

## 2017-06-02 NOTE — Progress Notes (Deleted)
Cardiology Office Note   Date:  06/02/2017   ID:  Jesse Bowers, DOB 05-Oct-1965, MRN [id number]  PCP:  Concepcion Elk, MD  Cardiologist: Dr. [name], pt preference Jesse Ferries, PA-C   No chief complaint on file.   History of Present Illness: Jesse Bowers is a 52 y.o. adult with a history of CVA onXarelto, panic attacks,protein C deficiency, neuropathy, celiac disease, OSA on C Pap, psoriatic arthritis, bilateral shoulder surgeries, dyspnea on exertion, palpitations with sinus tachycardia on event monitor up to a heart rate of 132  Phone notes regarding problems with volume on Lasix 20 mg a day, appointment made  Jesse Bowers presents for ***   Past Medical History:  Diagnosis Date  . Abnormal weight loss   . Anxiety    PTSD per patient  . Celiac disease   . Cervical neck pain with evidence of disc disease    patient has a cyst   . Degenerative disc disease at L5-S1 level   . Family history of adverse reaction to anesthesia    family has problems with anesthesia of nausea and vomiting   . Gender bias    prefers Male gender identity- "remains with vagina"  . Gluten enteropathy   . H/O parotitis    right   . H/O protein C deficiency    Dr. Anne Fu  . History of kidney stones   . Hx-TIA (transient ischemic attack)    2015  . Neck pain   . Neuromuscular disorder ([place])    bilateral neuropathy feet.  . Polycythemia, secondary   . PONV (postoperative nausea and vomiting)   . Psoriasis   . psoriatic arthritis   . Sleep apnea    cpap use- Dr. [name] follows  . Stroke [facility])    Stroke left side -slight right sided weakness-Dr. [name] follows  . 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 Tachycardia    hx of   . Transfusion history    past history- none recent  . Transgendered     Past Surgical History:  Procedure Laterality Date  . ABDOMINAL HYSTERECTOMY     TAH, BSO- tranverse incision.  [name] ANKLE ARTHROSCOPY WITH RECONSTRUCTION Right 2007   . CHOLECYSTECTOMY     laparoscopic  . HIP ARTHROSCOPY W/ LABRAL REPAIR Right 05/11/2013  . KNEE SURGERY Bilateral 1984   Right ACL, left PCL repair  . LIVER BIOPSY  2013   normal results.  [name] NASAL SEPTUM SURGERY N/A 09/20/2015  . THYROIDECTOMY, PARTIAL  2008  . TOTAL KNEE REVISION Left 02/06/2016   Procedure: LEFT TOTAL KNEE REVISION;  Surgeon: [name], MD;  Location: [facility];  Service: Orthopedics;  Laterality: Left;  . TOTAL KNEE REVISION Left 04/22/2017   Procedure: Left knee polyethylene revision;  Surgeon: [name], MD;  Location: [facility];  Service: Orthopedics;  Laterality: Left;    Current Outpatient Medications  Medication Sig Dispense Refill  . baclofen (LIORESAL) 20 MG tablet Take 1 tablet (20 mg total) by mouth 4 (four) times daily. 120 tablet 3  . betamethasone dipropionate (DIPROLENE) 0.05 % cream Apply topically 2 (two) times daily.    . Calcipotriene-Betameth Diprop (ENSTILAR) 0.005-0.064 % FOAM Apply 1 application topically 2 (two) times daily.     . clorazepate (TRANXENE) 15 MG tablet Take 15 mg by mouth at bedtime.    . clorazepate (TRANXENE) 7.5 MG tablet Take 7.5 mg by mouth 2 (two) times daily.     [name]  desonide (DESOWEN) 0.05 % ointment APPLY TO AFFECTED AREAS TWICE DAILY AS NEEDED FOR PSORIASIS.  2  . furosemide (LASIX) 20 MG tablet Take 1 tablet (20 mg total) by mouth daily as needed for fluid or edema. (Patient taking differently: Take 20 mg by mouth daily as needed for fluid or edema (if more than 3 lbs weight gain in a day). ) 30 tablet 11  . methocarbamol (ROBAXIN) 500 MG tablet Take 1 tablet (500 mg total) by mouth every 6 (six) hours as needed for muscle spasms. 60 tablet 0  . morphine (KADIAN) 60 MG 24 hr capsule Take 1 capsule (60 mg total) by mouth every 12 (twelve) hours. 60 capsule 0  . neomycin-polymyxin-hydrocortisone (CORTISPORIN) 3.5-10000-1 OTIC suspension PLACE 4 DROPS INTO LEFT EAR TWICE A DAY AS NEEDED FOR PAIN AND DRAINAGE  1  .  neomycin-polymyxin-hydrocortisone (CORTISPORIN) OTIC solution 4 (four) times daily.    . NONFORMULARY OR COMPOUNDED ITEM Apply 1 application topically 3 (three) times daily. 8% ketamine, 5% amitriptyline, 5% baclofen, 5% gabapentin  60 GM (Patient taking differently: Apply 1 application topically 3 (three) times daily as needed (pain). 8% ketamine, 5% amitriptyline, 5% baclofen, 5% gabapentin  60 GM) 1 each 5  . nortriptyline (PAMELOR) 10 MG capsule TAKE 1 TO 2 CAPSULES AT BEDTIME 60 capsule 1  . ondansetron (ZOFRAN) 4 MG tablet Take 1 tablet (4 mg total) by mouth every 8 (eight) hours as needed for nausea or vomiting. 30 tablet 0  . Oxycodone HCl 10 MG TABS Take 1-2 tablets (10-20 mg total) by mouth every 4 (four) hours as needed ((score 7 to 10)). 60 tablet 0  . potassium chloride (K-DUR) 10 MEQ tablet Take 1 tablet (10 mEq total) by mouth daily as needed. Take every time you take the Lasix (furosemide). 30 tablet 11  . prazosin (MINIPRESS) 1 MG capsule Take 1 mg by mouth at bedtime. Takes 1 mg and 2 mg to equal 3 mg at bedtime    . prazosin (MINIPRESS) 2 MG capsule Take 2 mg by mouth at bedtime. Takes 1 mg and 2 mg to equal 3 mg at bedtime    . testosterone cypionate (DEPO-TESTOSTERONE) 200 MG/ML injection Inject 0.25 mLs (50 mg total) into the muscle once a week. (Patient taking differently: Inject 100 mg into the muscle once a week. Friday) 10 mL 0  . ustekinumab (STELARA) 90 MG/ML SOSY injection Inject 90 mg into the skin every 3 (three) months. Last dose March 07, 2017    . venlafaxine XR (EFFEXOR-XR) 150 MG 24 hr capsule Take 300 mg by mouth daily with breakfast.    . XARELTO 20 MG TABS tablet TAKE 1 TABLET DAILY WITH SUPPER (Patient taking differently: TAKE 1 TABLET DAILY BEFORE BEDTIME) 90 tablet 1   No current facility-administered medications for this visit.     Allergies:   Penicillin g; Vancomycin; Duloxetine; Gabapentin; Tegaderm ag mesh [silver]; Acetaminophen; Ibuprofen;  Nortriptyline; Pregabalin; Sulfa antibiotics; and Sulfacetamide sodium-sulfur    Social History:  The patient  reports that he has never smoked. He has never used smokeless tobacco. He reports that he does not drink alcohol or use drugs.   Family History:  The patient's family history includes Cancer in his paternal grandfather; Congestive Heart Failure in his maternal grandmother; Heart attack in his maternal grandfather, maternal grandmother, and paternal grandfather; Hypertension in his mother; Protein C deficiency (age of onset: 83) in his sister; Psoriasis in his mother; Stroke in his maternal grandfather, maternal grandmother, and  paternal uncle.    ROS:  Please see the history of present illness. All other systems are reviewed and negative.    PHYSICAL EXAM: VS:  There were no vitals taken for this visit. , BMI There is no height or weight on file to calculate BMI. GEN: Well nourished, well developed, adult in no acute distress  HEENT: normal for age  Neck: no JVD, no carotid bruit, no masses Cardiac: RRR; no murmur, no rubs, or gallops Respiratory:  clear to auscultation bilaterally, normal work of breathing GI: soft, nontender, nondistended, + BS MS: no deformity or atrophy; no edema; distal pulses are 2+ in all 4 extremities   Skin: warm and dry, no rash Neuro:  Strength and sensation are intact Psych: euthymic mood, full affect   EKG:  EKG {ACTION; IS/IS MKJ:[id number]} ordered today. The ekg ordered today demonstrates ***   Recent Labs: 03/17/2017: Magnesium 2.2; TSH 4.980 04/22/2017: ALT 80 04/23/2017: BUN 18; Creatinine, Ser 1.03; Hemoglobin 9.5; Platelets 193; Potassium 4.3; Sodium 138    Lipid Panel No results found for: CHOL, TRIG, HDL, CHOLHDL, VLDL, LDLCALC, LDLDIRECT   Wt Readings from Last 3 Encounters:  04/22/17 238 lb (108 kg)  03/17/17 232 lb 12.8 oz (105.6 kg)  01/13/17 241 lb (109.3 kg)     Other studies Reviewed: Additional studies/ records that were  reviewed today include: ***.  ASSESSMENT AND PLAN:  1.  ***   Current medicines are reviewed at length with the patient today.  The patient {ACTIONS; HAS/DOES NOT HAVE:19233} concerns regarding medicines.  The following changes have been made:  {PLAN; NO CHANGE:13088:s}  Labs/ tests ordered today include: *** No orders of the defined types were placed in this encounter.    Disposition:   FU with Dr. [name]  Signed, Jesse Ferries, PA-C  06/02/2017 7:57 AM    [place] Phone: [phone]; Fax: [phone]  This note was written with the assistance of speech recognition software. Please excuse any transcriptional errors.

## 2017-06-02 NOTE — Telephone Encounter (Signed)
-----   Message -----  From: [name]  Sent: 06/02/2017  8:27 AM  To: [name]  Subject: Non-Urgent Medical Question             ----- Message from [place], Generic sent at 06/02/2017 8:27 AM EDT -----    Can We reschedule today? I'm sorry for the late notice. Fever and coughing. 102. Not getting out of bed except take kid to school. Jesse Bowers

## 2017-06-04 ENCOUNTER — Ambulatory Visit: Payer: BLUE CROSS/BLUE SHIELD | Admitting: Psychology

## 2017-06-09 ENCOUNTER — Ambulatory Visit: Payer: BLUE CROSS/BLUE SHIELD | Admitting: Psychology

## 2017-06-09 DIAGNOSIS — F4312 Post-traumatic stress disorder, chronic: Secondary | ICD-10-CM

## 2017-06-11 ENCOUNTER — Ambulatory Visit: Payer: BLUE CROSS/BLUE SHIELD | Admitting: Psychology

## 2017-06-11 ENCOUNTER — Other Ambulatory Visit: Payer: Self-pay | Admitting: *Deleted

## 2017-06-11 DIAGNOSIS — F4310 Post-traumatic stress disorder, unspecified: Secondary | ICD-10-CM | POA: Diagnosis not present

## 2017-06-11 DIAGNOSIS — R252 Cramp and spasm: Secondary | ICD-10-CM

## 2017-06-12 ENCOUNTER — Other Ambulatory Visit: Payer: Self-pay | Admitting: Registered Nurse

## 2017-06-16 ENCOUNTER — Ambulatory Visit (INDEPENDENT_AMBULATORY_CARE_PROVIDER_SITE_OTHER): Payer: BLUE CROSS/BLUE SHIELD | Admitting: Psychology

## 2017-06-16 DIAGNOSIS — F4312 Post-traumatic stress disorder, chronic: Secondary | ICD-10-CM

## 2017-06-18 ENCOUNTER — Ambulatory Visit (INDEPENDENT_AMBULATORY_CARE_PROVIDER_SITE_OTHER): Payer: BLUE CROSS/BLUE SHIELD | Admitting: Psychology

## 2017-06-18 DIAGNOSIS — F4310 Post-traumatic stress disorder, unspecified: Secondary | ICD-10-CM | POA: Diagnosis not present

## 2017-06-19 ENCOUNTER — Encounter: Payer: Self-pay | Admitting: Physician Assistant

## 2017-06-19 ENCOUNTER — Ambulatory Visit (INDEPENDENT_AMBULATORY_CARE_PROVIDER_SITE_OTHER): Payer: BLUE CROSS/BLUE SHIELD | Admitting: Physician Assistant

## 2017-06-19 VITALS — BP 124/80 | HR 97 | Ht 69.0 in | Wt 238.8 lb

## 2017-06-19 DIAGNOSIS — I5032 Chronic diastolic (congestive) heart failure: Secondary | ICD-10-CM

## 2017-06-19 DIAGNOSIS — R000 Tachycardia, unspecified: Secondary | ICD-10-CM

## 2017-06-19 MED ORDER — FUROSEMIDE 20 MG PO TABS: ORAL_TABLET | ORAL | 3 refills | Status: DC

## 2017-06-19 NOTE — Progress Notes (Signed)
Cardiology Office Note   Date:  06/19/2017   ID:  Jesse Bowers, DOB [birth year], MRN [id number]  PCP:  Concepcion Elk, MD  Cardiologist: Dr. Chriss Czar Nick Armel, PA-C 01/13/2017   History of Present Illness: Jesse Bowers is a 52 y.o. adult with a history of CVA onXarelto, panic attacks,protein C deficiency, neuropathy, celiac disease, OSA on C Pap, psoriatic arthritis, bilateral shoulder surgeries, dyspnea on exertion, palpitations with sinus tachycardia on event monitor up to a heart rate of 132  3/22 phone notes regarding weight increase from fluid  Jesse Bowers presents for cardiology follow up.  His weight today is 232 lbs on his home scales. He does not take Lasix every day. He takes 40 mg as needed. He ends up doing that 2-3 x week. His weight chart shows 231-234 lbs for most of April, but had 1 reading of 239 lbs, took Lasix and back to 234 in 2 days.  He thinks he has lost some actual weight because his baseline weight is down about 10 lbs. He still gets some daytime edema, but does not wake with much.   He is doing PT, but not walking as much because his knee hurts more. He is working with PT, trying to get stronger.   He has recently started the Luvox, he was having brief episodes of paralysis. They were felt 2nd to his medications. These have improved on the Luvox.    Past Medical History:  Diagnosis Date  . Abnormal weight loss   . Anxiety    PTSD per patient  . Celiac disease   . Cervical neck pain with evidence of disc disease    patient has a cyst   . Degenerative disc disease at L5-S1 level   . Family history of adverse reaction to anesthesia    family has problems with anesthesia of nausea and vomiting   . Gender bias    prefers Male gender identity- "remains with vagina"  . Gluten enteropathy   . H/O parotitis    right   . H/O protein C deficiency    Dr. Anne Fu  . History of kidney stones   . Hx-TIA (transient ischemic attack)    2015  .  Neck pain   . Neuromuscular disorder ([place])    bilateral neuropathy feet.  . Polycythemia, secondary   . PONV (postoperative nausea and vomiting)   . Psoriasis   . psoriatic arthritis   . Sleep apnea    cpap use- Dr. [name] follows  . Stroke [facility])    Stroke left side -slight right sided weakness-Dr. [name] follows  . Syrinx of spinal cord ([place]) 01/06/2014   c spine on MRI  . Tachycardia    hx of   . Transfusion history    past history- none recent  . Transgendered     Past Surgical History:  Procedure Laterality Date  . ABDOMINAL HYSTERECTOMY     TAH, BSO- tranverse incision.  [name] ANKLE ARTHROSCOPY WITH RECONSTRUCTION Right 2007  . CHOLECYSTECTOMY     laparoscopic  . HIP ARTHROSCOPY W/ LABRAL REPAIR Right 05/11/2013  . KNEE SURGERY Bilateral 1984   Right ACL, left PCL repair  . LIVER BIOPSY  2013   normal results.  [name] NASAL SEPTUM SURGERY N/A 09/20/2015  . THYROIDECTOMY, PARTIAL  2008  . TOTAL KNEE REVISION Left 02/06/2016   Procedure: LEFT TOTAL KNEE REVISION;  Surgeon: [name], MD;  [facility];  Service: Orthopedics;  Laterality: Left;  . TOTAL KNEE  REVISION Left 04/22/2017   Procedure: Left knee polyethylene revision;  Surgeon: [name], MD;  Location: [facility];  Service: Orthopedics;  Laterality: Left;    Current Outpatient Medications  Medication Sig Dispense Refill  . baclofen (LIORESAL) 20 MG tablet Take 1 tablet (20 mg total) by mouth 4 (four) times daily. 120 tablet 3  . betamethasone dipropionate (DIPROLENE) 0.05 % cream Apply topically 2 (two) times daily.    . Calcipotriene-Betameth Diprop (ENSTILAR) 0.005-0.064 % FOAM Apply 1 application topically 2 (two) times daily.     . clorazepate (TRANXENE) 15 MG tablet Take 15 mg by mouth at bedtime.    . clorazepate (TRANXENE) 7.5 MG tablet Take 7.5 mg by mouth 2 (two) times daily.     [name] desonide (DESOWEN) 0.05 % ointment APPLY TO AFFECTED AREAS TWICE DAILY AS NEEDED FOR PSORIASIS.  2  . fluvoxaMINE (LUVOX)  25 MG tablet TAKE 1 TABLET DAILY FOR 1 WEEK THEN TAKE 1 TABLET TWICE DAILY  2  . furosemide (LASIX) 20 MG tablet Take 1 tablet (20 mg total) by mouth daily as needed for fluid or edema. (Patient taking differently: Take 20 mg by mouth daily as needed for fluid or edema (if more than 3 lbs weight gain in a day). ) 30 tablet 11  . methocarbamol (ROBAXIN) 500 MG tablet Take 1 tablet (500 mg total) by mouth every 6 (six) hours as needed for muscle spasms. 60 tablet 0  . morphine (KADIAN) 60 MG 24 hr capsule Take 1 capsule (60 mg total) by mouth every 12 (twelve) hours. 60 capsule 0  . neomycin-polymyxin-hydrocortisone (CORTISPORIN) 3.5-10000-1 OTIC suspension PLACE 4 DROPS INTO LEFT EAR TWICE A DAY AS NEEDED FOR PAIN AND DRAINAGE  1  . neomycin-polymyxin-hydrocortisone (CORTISPORIN) OTIC solution 4 (four) times daily.    . NONFORMULARY OR COMPOUNDED ITEM Apply 1 application topically 3 (three) times daily. 8% ketamine, 5% amitriptyline, 5% baclofen, 5% gabapentin  60 GM (Patient taking differently: Apply 1 application topically 3 (three) times daily as needed (pain). 8% ketamine, 5% amitriptyline, 5% baclofen, 5% gabapentin  60 GM) 1 each 5  . nortriptyline (PAMELOR) 10 MG capsule TAKE 1 TO 2 CAPSULES AT BEDTIME 60 capsule 1  . ondansetron (ZOFRAN) 4 MG tablet Take 1 tablet (4 mg total) by mouth every 8 (eight) hours as needed for nausea or vomiting. 30 tablet 0  . Oxycodone HCl 10 MG TABS Take 1-2 tablets (10-20 mg total) by mouth every 4 (four) hours as needed ((score 7 to 10)). 60 tablet 0  . potassium chloride (K-DUR) 10 MEQ tablet Take 1 tablet (10 mEq total) by mouth daily as needed. Take every time you take the Lasix (furosemide). 30 tablet 11  . prazosin (MINIPRESS) 1 MG capsule Take 1 mg by mouth at bedtime. Takes 1 mg and 2 mg to equal 3 mg at bedtime    . prazosin (MINIPRESS) 2 MG capsule Take 2 mg by mouth at bedtime. Takes 1 mg and 2 mg to equal 3 mg at bedtime    . testosterone cypionate  (DEPO-TESTOSTERONE) 200 MG/ML injection Inject 0.25 mLs (50 mg total) into the muscle once a week. (Patient taking differently: Inject 100 mg into the muscle once a week. Friday) 10 mL 0  . ustekinumab (STELARA) 90 MG/ML SOSY injection Inject 90 mg into the skin every 3 (three) months. Last dose March 07, 2017    . venlafaxine XR (EFFEXOR-XR) 150 MG 24 hr capsule Take 300 mg by mouth daily with breakfast.    .  XARELTO 20 MG TABS tablet TAKE 1 TABLET DAILY WITH SUPPER (Patient taking differently: TAKE 1 TABLET DAILY BEFORE BEDTIME) 90 tablet 1   No current facility-administered medications for this visit.     Allergies:   Penicillin g; Vancomycin; Duloxetine; Gabapentin; Tegaderm ag mesh [silver]; Acetaminophen; Ibuprofen; Nortriptyline; Pregabalin; Sulfa antibiotics; and Sulfacetamide sodium-sulfur    Social History:  The patient  reports that he has never smoked. He has never used smokeless tobacco. He reports that he does not drink alcohol or use drugs.   Family History:  The patient's family history includes Cancer in his paternal grandfather; Congestive Heart Failure in his maternal grandmother; Heart attack in his maternal grandfather, maternal grandmother, and paternal grandfather; Hypertension in his mother; Protein C deficiency (age of onset: 37) in his sister; Psoriasis in his mother; Stroke in his maternal grandfather, maternal grandmother, and paternal uncle.    ROS:  Please see the history of present illness. All other systems are reviewed and negative.    PHYSICAL EXAM: VS:  BP 124/80 (BP Location: Right Arm, Patient Position: Sitting, Cuff Size: Large)   Pulse 97   Ht 5' 9"  (1.753 m)   SpO2 96%   BMI 35.15 kg/m  , BMI Body mass index is 35.15 kg/m. GEN: Well nourished, well developed, adult in no acute distress  HEENT: normal for age  Neck: no JVD, no carotid bruit, no masses Cardiac: RRR; soft murmur, no rubs, or gallops Respiratory:  clear to auscultation bilaterally,  normal work of breathing GI: soft, nontender, nondistended, + BS MS: no deformity or atrophy; 1+ edema; distal pulses are 2+ in all 4 extremities   Skin: warm and dry, no rash Neuro:  Strength and sensation are intact Psych: euthymic mood, full affect   EKG:  EKG is not ordered today.   Recent Labs: 03/17/2017: Magnesium 2.2; TSH 4.980 04/22/2017: ALT 80 04/23/2017: BUN 18; Creatinine, Ser 1.03; Hemoglobin 9.5; Platelets 193; Potassium 4.3; Sodium 138    Lipid Panel No results found for: CHOL, TRIG, HDL, CHOLHDL, VLDL, LDLCALC, LDLDIRECT   Wt Readings from Last 3 Encounters:  04/22/17 238 lb (108 kg)  03/17/17 232 lb 12.8 oz (105.6 kg)  01/13/17 241 lb (109.3 kg)     Other studies Reviewed: Additional studies/ records that were reviewed today include: office notes, hospital records and testing.  ASSESSMENT AND PLAN:  1.  Chronic diastolic CHF: He doing well from a volume management standpoint. We discussed Lasix dosing.  The best plan for him for right now, is to take Lasix 20 mg a day and increase it to 40 mg qd prn for weight gain. He feels this will work well for him.   2. Tachycardia: no episodes    Current medicines are reviewed at length with the patient today.  The patient has concerns regarding medicines. Concerns were addressed  The following changes have been made:  Lasix changed  Labs/ tests ordered today include:  No orders of the defined types were placed in this encounter.    Disposition:   FU with Dr. [name]  Signed, Rosaria Ferries, PA-C  06/19/2017 3:22 PM    [place] Phone: [phone]; Fax: [phone]  This note was written with the assistance of speech recognition software. Please excuse any transcriptional errors.

## 2017-06-19 NOTE — Patient Instructions (Addendum)
Medication Instructions:  Start: Lasix 20 mg daily. Take 1 extra 20 mg daily as needed for a weight gain of 3 lbs in a day or 5 lbs in a week above base weight. Maintain a weight of 234 lbs    Follow-Up: Your physician recommends that you schedule a follow-up appointment in: 3 months with Dr. [name]   Any Other Special Instructions Will Be Listed Below (If Applicable). Maintain a low Sodium diet  Continue Volume Management     If you need a refill on your cardiac medications before your next appointment, please call your pharmacy.

## 2017-06-23 ENCOUNTER — Ambulatory Visit (INDEPENDENT_AMBULATORY_CARE_PROVIDER_SITE_OTHER): Payer: BLUE CROSS/BLUE SHIELD | Admitting: Psychology

## 2017-06-23 DIAGNOSIS — F4312 Post-traumatic stress disorder, chronic: Secondary | ICD-10-CM

## 2017-06-24 ENCOUNTER — Encounter: Payer: Self-pay | Admitting: Physical Medicine & Rehabilitation

## 2017-06-24 ENCOUNTER — Other Ambulatory Visit: Payer: Self-pay

## 2017-06-24 ENCOUNTER — Encounter
Payer: BLUE CROSS/BLUE SHIELD | Attending: Physical Medicine & Rehabilitation | Admitting: Physical Medicine & Rehabilitation

## 2017-06-24 VITALS — BP 128/81 | HR 100 | Ht 69.0 in | Wt 232.0 lb

## 2017-06-24 DIAGNOSIS — D6859 Other primary thrombophilia: Secondary | ICD-10-CM | POA: Insufficient documentation

## 2017-06-24 DIAGNOSIS — M5416 Radiculopathy, lumbar region: Secondary | ICD-10-CM

## 2017-06-24 DIAGNOSIS — I69398 Other sequelae of cerebral infarction: Secondary | ICD-10-CM | POA: Insufficient documentation

## 2017-06-24 DIAGNOSIS — Z79891 Long term (current) use of opiate analgesic: Secondary | ICD-10-CM | POA: Diagnosis not present

## 2017-06-24 DIAGNOSIS — Z7901 Long term (current) use of anticoagulants: Secondary | ICD-10-CM | POA: Diagnosis not present

## 2017-06-24 DIAGNOSIS — D751 Secondary polycythemia: Secondary | ICD-10-CM | POA: Insufficient documentation

## 2017-06-24 DIAGNOSIS — G959 Disease of spinal cord, unspecified: Secondary | ICD-10-CM | POA: Diagnosis not present

## 2017-06-24 DIAGNOSIS — M13862 Other specified arthritis, left knee: Secondary | ICD-10-CM | POA: Insufficient documentation

## 2017-06-24 DIAGNOSIS — F418 Other specified anxiety disorders: Secondary | ICD-10-CM | POA: Diagnosis not present

## 2017-06-24 DIAGNOSIS — M7522 Bicipital tendinitis, left shoulder: Secondary | ICD-10-CM | POA: Insufficient documentation

## 2017-06-24 DIAGNOSIS — M545 Low back pain: Secondary | ICD-10-CM | POA: Insufficient documentation

## 2017-06-24 DIAGNOSIS — G894 Chronic pain syndrome: Secondary | ICD-10-CM | POA: Diagnosis not present

## 2017-06-24 DIAGNOSIS — G8929 Other chronic pain: Secondary | ICD-10-CM | POA: Diagnosis present

## 2017-06-24 DIAGNOSIS — Z87890 Personal history of sex reassignment: Secondary | ICD-10-CM | POA: Diagnosis not present

## 2017-06-24 DIAGNOSIS — I69351 Hemiplegia and hemiparesis following cerebral infarction affecting right dominant side: Secondary | ICD-10-CM | POA: Diagnosis not present

## 2017-06-24 DIAGNOSIS — S43002A Unspecified subluxation of left shoulder joint, initial encounter: Secondary | ICD-10-CM | POA: Insufficient documentation

## 2017-06-24 DIAGNOSIS — Z79899 Other long term (current) drug therapy: Secondary | ICD-10-CM | POA: Insufficient documentation

## 2017-06-24 DIAGNOSIS — I69320 Aphasia following cerebral infarction: Secondary | ICD-10-CM | POA: Insufficient documentation

## 2017-06-24 DIAGNOSIS — L4050 Arthropathic psoriasis, unspecified: Secondary | ICD-10-CM | POA: Diagnosis not present

## 2017-06-24 DIAGNOSIS — R209 Unspecified disturbances of skin sensation: Secondary | ICD-10-CM | POA: Insufficient documentation

## 2017-06-24 DIAGNOSIS — Z9181 History of falling: Secondary | ICD-10-CM | POA: Insufficient documentation

## 2017-06-24 DIAGNOSIS — G950 Syringomyelia and syringobulbia: Secondary | ICD-10-CM | POA: Diagnosis not present

## 2017-06-24 DIAGNOSIS — Z86718 Personal history of other venous thrombosis and embolism: Secondary | ICD-10-CM | POA: Diagnosis not present

## 2017-06-24 DIAGNOSIS — K900 Celiac disease: Secondary | ICD-10-CM | POA: Insufficient documentation

## 2017-06-24 MED ORDER — OXYCODONE HCL 10 MG PO TABS: 10.0000 mg | ORAL_TABLET | ORAL | 0 refills | Status: DC | PRN

## 2017-06-24 MED ORDER — MORPHINE SULFATE ER 60 MG PO CP24: 60.0000 mg | ORAL_CAPSULE | Freq: Two times a day (BID) | ORAL | 0 refills | Status: DC

## 2017-06-24 NOTE — Patient Instructions (Signed)
PLEASE FEEL FREE TO CALL OUR OFFICE WITH ANY PROBLEMS OR QUESTIONS ([phone])      

## 2017-06-24 NOTE — Progress Notes (Signed)
Subjective:    Patient ID: Jesse Bowers, adult    DOB: 1965-07-08, 52 y.o.   MRN: [id number]  HPI   [name] is here in follow-up of his gait disorder and chronic pain.  After his last visit with me I sent him to receive a right L5/S1 epidural injection via translaminar approach which he received on 3/29. He has had good results so far with substantial decrease in the radicular pain involving his right leg for his knee.  He has been involved in home therapies including stretching, yoga movements, etc. He is also receiving outpt PT as well.  They are not working on his low back however.  Collins biggest complaint today is increasing weakness and sensory loss in his left arm and leg.  He has mentioned this in the past and has had concerns over this.  Has had more difficulty performing tasks which involve both of his arms.  We recently increase his baclofen to 4 spasticity which he feels has helped with tone.   Pain Inventory Average Pain 7 Pain Right Now 6 My pain is constant, sharp, dull, stabbing and aching  In the last 24 hours, has pain interfered with the following? General activity 10 Relation with others 10 Enjoyment of life 10 What TIME of day is your pain at its worst? daytime night Sleep (in general) Fair  Pain is worse with: walking, sitting, standing and some activites Pain improves with: medication and injections Relief from Meds: n/a  Mobility walk with assistance how many minutes can you walk? 10 ability to climb steps?  no do you drive?  no use a wheelchair transfers alone  Function disabled: date disabled 01/2013 I need assistance with the following:  feeding, dressing, bathing, meal prep, household duties and shopping  Neuro/Psych bladder control problems weakness numbness tremor trouble walking spasms depression anxiety  Prior Studies Any changes since last visit?  no  Physicians involved in your care Any changes since last visit?  no   Family  History  Problem Relation Age of Onset  . Stroke Maternal Grandfather        14  . Heart attack Maternal Grandfather   . Hypertension Mother   . Psoriasis Mother   . Cancer Paternal Grandfather   . Heart attack Paternal Grandfather   . Stroke Paternal Uncle        age 82  . Stroke Maternal Grandmother   . Congestive Heart Failure Maternal Grandmother   . Heart attack Maternal Grandmother   . Protein C deficiency Sister 82       Miscarriages   Social History   Socioeconomic History  . Marital status: Married    Spouse name: Not on file  . Number of children: 2  . Years of education: 4y college  . Highest education level: Not on file  Occupational History  . Occupation: Pediatric Nurse practitioner    Comment: Not working since [place] 2015  Social Needs  . Financial resource strain: Not on file  . Food insecurity:    Worry: Not on file    Inability: Not on file  . Transportation needs:    Medical: Not on file    Non-medical: Not on file  Tobacco Use  . Smoking status: Never Smoker  . Smokeless tobacco: Never Used  Substance and Sexual Activity  . Alcohol use: No    Frequency: Never  . Drug use: No  . Sexual activity: Yes    Birth control/protection: None    Comment:  patient is a transgender on testosterone shots, no biological kids  Lifestyle  . Physical activity:    Days per week: Not on file    Minutes per session: Not on file  . Stress: Not on file  Relationships  . Social connections:    Talks on phone: Not on file    Gets together: Not on file    Attends religious service: Not on file    Active member of club or organization: Not on file    Attends meetings of clubs or organizations: Not on file    Relationship status: Not on file  Other Topics Concern  . Not on file  Social History Narrative   Education 4 year college, former Therapist, sports X 15 years, pediatric nurse practitioner x 6 years, did NP degree from [employer] [place]. Relocated to [place] about 2  months ago from [place], [place]. Patient was in [place] for last 4 years and prior to that in [place]. His wife is working as Scientist, research (physical sciences) for [employer]. Patient is not working and applying for disability. They have 2 kids but no biologic children.    Past Surgical History:  Procedure Laterality Date  . ABDOMINAL HYSTERECTOMY     TAH, BSO- tranverse incision.  [name] ANKLE ARTHROSCOPY WITH RECONSTRUCTION Right 2007  . CHOLECYSTECTOMY     laparoscopic  . HIP ARTHROSCOPY W/ LABRAL REPAIR Right 05/11/2013  . KNEE SURGERY Bilateral 1984   Right ACL, left PCL repair  . LIVER BIOPSY  2013   normal results.  [name] NASAL SEPTUM SURGERY N/A 09/20/2015  . THYROIDECTOMY, PARTIAL  2008  . TOTAL KNEE REVISION Left 02/06/2016   Procedure: LEFT TOTAL KNEE REVISION;  Surgeon: [name], MD;  Location: [facility];  Service: Orthopedics;  Laterality: Left;  . TOTAL KNEE REVISION Left 04/22/2017   Procedure: Left knee polyethylene revision;  Surgeon: [name], MD;  Location: [facility];  Service: Orthopedics;  Laterality: Left;   Past Medical History:  Diagnosis Date  . Abnormal weight loss   . Anxiety    PTSD per patient  . Celiac disease   . Cervical neck pain with evidence of disc disease    patient has a cyst   . Degenerative disc disease at L5-S1 level   . Family history of adverse reaction to anesthesia    family has problems with anesthesia of nausea and vomiting   . Gender bias    prefers Male gender identity- "remains with vagina"  . Gluten enteropathy   . H/O parotitis    right   . H/O protein C deficiency    Dr. Anne Fu  . History of kidney stones   . Hx-TIA (transient ischemic attack)    2015  . Neck pain   . Neuromuscular disorder ([facility])    bilateral neuropathy feet.  . Polycythemia, secondary   . PONV (postoperative nausea and vomiting)   . Psoriasis   . psoriatic arthritis   . Sleep apnea    cpap use- Dr. [name] follows  . Stroke [facility])    Stroke left side -slight right sided  weakness-Dr. [name] follows  . 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 Tachycardia    hx of   . Transfusion history    past history- none recent  . Transgendered    BP 128/81   Pulse 100   Ht 5' 9"  (1.753 m) Comment: pt reported  Wt 232 lb (105.2 kg) Comment: pt reported in  wheelchair, had weight checked last week  SpO2 93%   BMI 34.26 kg/m   Opioid Risk Score:   Fall Risk Score:  `1  Depression screen PHQ 2/9  Depression screen [facility] 2/9 06/24/2017 02/18/2017 11/10/2016 09/15/2016  Decreased Interest 0 1 0 0  Down, Depressed, Hopeless 0 1 0 0  PHQ - 2 Score 0 2 0 0  Altered sleeping 0 - - -  Tired, decreased energy 0 - - -  Change in appetite 0 - - -  Feeling bad or failure about yourself  0 - - -  Trouble concentrating 0 - - -  Moving slowly or fidgety/restless 0 - - -  Suicidal thoughts 0 - - -  PHQ-9 Score 0 - - -  Some recent data might be hidden    Review of Systems  Constitutional: Negative.   HENT: Negative.   Eyes: Negative.   Respiratory: Negative.   Cardiovascular: Negative.   Gastrointestinal: Negative.   Endocrine: Negative.   Genitourinary: Negative.   Musculoskeletal: Negative.   Skin: Negative.   Allergic/Immunologic: Negative.   Neurological: Negative.   Hematological: Negative.   Psychiatric/Behavioral: Negative.   All other systems reviewed and are negative.      Objective:   Physical Exam General: No acute distress HEENT: EOMI, oral membranes moist Cards: reg rate  Chest: normal effort Abdomen: Soft, NT, ND Skin: dry, intact Extremities: no edema  Skin:Clean and intact without signs of breakdown. Left TKA scar.  Neuro:Pt is pleasant and alert and oriented x 3. Minimalright facial weakness and sensory loss is appreciated. UES: 4-  To 4/5 LUE remain. RUE: 4- to 4+deltoid, bicep, tricep, wrist and grip---. RLE: 3+ to 4-/5hf, ke and ankle df. LE: 3+to 4-/5 prox to /5 distal .  Reflexes are hyperactive in both lower  extremities without resting tone.    Sensory loss notable  LOC 7.  There is some diminishment and see 6 and 7 however, both left and right seems somewhat involved.  Decreased sensation over the chest wall and lower extremities also to light touch and pain.  Low back remains tender to palpation.  I did not examine the shoulders were  Musculoskeletal:Rotator cuff or bicipital tendon integrity today.  Psych:Pt's affect is appropriate. Pt is cooperative. Is distracted at times   Assessment & Plan:  1. Psoriatic arthritis with pain in multiple areas, most prominently feet, hands, elbows.Hispain is also related to his prior CVA and associated motor/sensory change patient's complaining of increased s/spasticity. 2. 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  3. Hx of left total knee revision again on 04/21/17 per [facility] 4. 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 Left S1 radiculopathy? S/p LEFT L5-S1 ESI 5. Protein C deficiency  6. Left shoulder subluxation, bicipital tendonitis  7. Central sleep apnea  8. Polycythemia  9. Depression with anxiety. 10. Left lateral epidondylitis  11. 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  Weakness involving the left side as well now.  Exam does show some increase in weakness and sensory findings today.  MRI from February 2018 was essentially stable  Plan:  1.   Given his continued complaints and progression evident on examination reassess the mid cervical region/I have ordered an MRI of the cervical spine to syrinx area.  There is no indication to scan his lumbar spine at this point.   2.MaintainHEP as possible  3.Voltaren gel to hands, feet, knees,  elbows 4.Baclofen for lower extermity spasms/back pain. 20 mg 4 times daily #120.  I discussed with him that the increase in baclofen could increase the perception of weakness although this would not  cause sensory changes. 5. Refilled kadian 52m q12 #60 and oxycodone 142m#90 today. We will continue the controlled substance monitoring program, this consists of regular clinic visits, examinations, routine drug screening, pill counts [place] well [place].   6.maintain pamelor 1044mhs for sleep and neuropathic pain 7.    Patient has had good results with the right L5/S1 translaminar epidural injection.  Encouraged him to work on ongoing core muscle strengthening and lengthening.  He may want to speak with his outpatient therapist, who are focusing on his knees, regarding some back work they could do.  They will likely need a new prescription for this.     11m74mes of face to face patient care time were spent during this visit.  follow-up with me in about 1 month's time.All questions were encouraged and answered.     Greater than 50% of this visit was spent reviewing his scans and coming up with a custom treatment plan.

## 2017-06-25 ENCOUNTER — Other Ambulatory Visit: Payer: Self-pay | Admitting: *Deleted

## 2017-06-25 ENCOUNTER — Ambulatory Visit (INDEPENDENT_AMBULATORY_CARE_PROVIDER_SITE_OTHER): Payer: BLUE CROSS/BLUE SHIELD | Admitting: Psychology

## 2017-06-25 DIAGNOSIS — F4310 Post-traumatic stress disorder, unspecified: Secondary | ICD-10-CM

## 2017-06-25 DIAGNOSIS — R252 Cramp and spasm: Secondary | ICD-10-CM

## 2017-06-25 NOTE — Telephone Encounter (Signed)
Error, no meds ordered

## 2017-06-26 ENCOUNTER — Encounter: Payer: Self-pay | Admitting: Physical Medicine & Rehabilitation

## 2017-06-29 ENCOUNTER — Ambulatory Visit: Payer: Self-pay | Admitting: Physical Medicine & Rehabilitation

## 2017-06-30 ENCOUNTER — Ambulatory Visit: Payer: Self-pay | Admitting: Psychology

## 2017-07-01 NOTE — Progress Notes (Signed)
[facility] FOLLOW UP NOTE DATE OF VISIT: 07/06/2017   Patient Care Team: Concepcion Elk, MD as PCP - General (Internal Medicine) Bernadene Bell, MD (Inactive) as Consulting Physician (Hematology) Rankins, Bill Salinas, MD as Referring Physician (Family Medicine)  CHIEF COMPLAINTS Follow up DVT and polycythemia  HISTORY OF INITIAL PRESENTING ILLNESS (from Dr. Renella Cunas note):   Jesse Bowers 52 y.o. 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 Patient is a former Software engineer. Patient had a fractured foot repair in 2007 and caused a right leg DVT. At the time he took Coumadin for 3 months.   In May of this year 2015 patient presented with right-sided weakness and slurring of speech, he felt tired, had some nausea and was having trouble gripping the soap when he was in the shower. He checked himself in the mirror there was no facial droop but based on the symptoms he texted his wife took him to the hospital where he was kept overnight. His symptoms of TIA lasted for more than 24 hours and he had appropriate neuro imaging done. When he was seen by a neurologist later, he was told that he had a left frontal stroke. This happened at a young age of 83 so his primary care physician ordered hypercoagulable panel and that showed a protein C deficiency. This TIA or stroke event took place on 07/27/2013 an MRI brain from that date showed changes of early chronic small vessel ischemic changes. A note from the previous hematologist Dr Laury Axon in [facility] indicated that at [facility] they also felt that it was not a stroke. Carotid ultrasound showed minimal atherosclerotic plaques. The hypercoagulable panel was drawn on 08/19/2013 and he was negative for factor V Leiden mutation, prothrombin gene mutation. He had normal levels of antithrombin III and protein S but the protein C level was decreased at 47%(reference range  is 70-180%). A repeat protein C activity done on 09/28/2013 was 58% still low. Lupus anticoagulant was negative. Beta-2 glycoprotein antibodies were negative.  Patient is currently receiving a baby aspirin for last 3 months. The hematologist in [place] Dr. Alain Marion also gave him some Lovenox injections for prophylactic use just before a long distance travel. He encouraged him to avoid inactivity/immobility. Patient was not given an anticoagulant such as warfarin or the new generation drugs like Xarelto or Eliquis.  On 04/28/2013 patient's CBC showed a white count of 6.2 hemoglobin 18.7 hematocrit 53.1 MCV 92 platelet count of 207. Differential was normal. Chemistry panel was normal creatinine 1.23. Patient does have history of elevated liver enzymes in the past and the etiology of that is not clear likely medication related. He had liver biopsy done in 2013 which was reportedly normal. Patient was taking Remicade for over an year and it was helping his skin lesions of psoriasis but not the joint pain. It was switched to Enbrel which he is still taking a subcutaneous shot once a week that is every Sunday.  Patient also have some pain control issues and was seeing a pain specialist in [place]. He is in the process of getting a referral to a pain specialist here. His pain medications include morphine extended release, oxycodone, occasional naproxen and methadone. Patient is also on antidepressants.  Patient also had a clot at PICC line for antibiotics in 2006. Patient had history of psoriasis and psoriatic arthritis being managed by a rheumatologist in [place]. Now he is shifting his care to a local rheumatologist  here. Most of his psoriasis involves the bottom of the feet mostly on the left side, hands, knees and ears.  Patient also had a history of right-sided parotitis documented in May 2015 that was treated with antibiotics. He does have chronic dry mouth because of sleep apnea as well as his  antidepressant therapy.  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 That itself can increase the risk for both arterial as well as venous thromboembolic events. 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 For his depression patient was on Prozac in March 2015 but it was causing decreased sexual libido and changed to Wellbutrin in March 2015. He does have mild constipation with the use of narcotics and takes Colace twice a day.  Patient is also complaining of 50 pound weight loss in the last 3-4 months. He thinks it is unintentional. He has been on immunosuppressant medication such as Remicade and Enbrel and sometimes those can be associated with possibility of non-Hodgkin lymphomas. He also have chronic pain and recently saw an orthopedic physician who recommended MRI of the neck which will be scheduled. He is also seeing a rheumatologist next month, a neurologist and a pain specialist. He does complain of left shoulder pain and left elbow pain. There was some discussion by previous rheumatologist in [place] about switching his biological therapy boost to Stelara or [place] Nail once he is evaluated by a rheumatologist. Patient had already received his flu shot as well as pneumonia shot. He also complains of having significant short-term memory problems and trouble concentrating which could be from his medications or TIA episode.  So patient's active hematologic problems are  #1 Protein C deficiency documented twice. #2 TIA/CVA in May 2015 #3 history of DVT in 2007 Right leg treated with Coumadin x 3 months. It was provoked by foot injury #3 Use of immunosuppressant medications which increase the likelihood of lymphomas. 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  CURRENT THERAPY: Xarelto 20  mg once daily, phlebotomy if hematocrit above 45%  INTERVAL HISTORY:  Jesse Bowers returns for follow-up. He was last seen by me 6 months ago. He presents to the clinic today by himself. He reports he is doing okay overall. He reports he had a lump on his left chest wall in 2005 that was biopsied negative. He is concerned about the soft tissue left over. He states he has not been doing so well with walking and he is able to walk about 25-50 ft with his walker. He is taking morphine and oxycodone for his back/body pain. He is complaint with Xarelto.   On review of systems, pt denies any other complaints at this time. Pertinent positives are listed and detailed within the above HPI.   MEDICAL HISTORY:   Past Medical History:  Diagnosis Date  . Abnormal weight loss   . Anxiety    PTSD per patient  . Celiac disease   . Cervical neck pain with evidence of disc disease    patient has a cyst   . Degenerative disc disease at L5-S1 level   . Family history of adverse reaction to anesthesia    family has problems with anesthesia of nausea and vomiting   . Gender bias    prefers Male gender identity- "remains with vagina"  . Gluten enteropathy   . H/O parotitis  right   . H/O protein C deficiency    Dr. Anne Fu  . History of kidney stones   . Hx-TIA (transient ischemic attack)    2015  . Neck pain   . Neuromuscular disorder ([place])    bilateral neuropathy feet.  . Polycythemia, secondary   . PONV (postoperative nausea and vomiting)   . Psoriasis   . psoriatic arthritis   . Sleep apnea    cpap use- Dr. [name] follows  . Stroke [facility])    Stroke left side -slight right sided weakness-Dr. [name] follows  . Syrinx of spinal cord ([place]) 01/06/2014   c spine on MRI  . Tachycardia    hx of   . Transfusion history    past history- none recent  . Transgendered     SURGICAL HISTORY:  Past Surgical History:  Procedure Laterality Date  . ABDOMINAL HYSTERECTOMY     TAH, BSO- tranverse  incision.  [name] ANKLE ARTHROSCOPY WITH RECONSTRUCTION Right 2007  . CHOLECYSTECTOMY     laparoscopic  . HIP ARTHROSCOPY W/ LABRAL REPAIR Right 05/11/2013  . KNEE SURGERY Bilateral 1984   Right ACL, left PCL repair  . LIVER BIOPSY  2013   normal results.  [name] NASAL SEPTUM SURGERY N/A 09/20/2015  . THYROIDECTOMY, PARTIAL  2008  . TOTAL KNEE REVISION Left 02/06/2016   Procedure: LEFT TOTAL KNEE REVISION;  Surgeon: [name], MD;  Location: [facility];  Service: Orthopedics;  Laterality: Left;  . TOTAL KNEE REVISION Left 04/22/2017   Procedure: Left knee polyethylene revision;  Surgeon: [name], MD;  Location: [facility];  Service: Orthopedics;  Laterality: Left;    SOCIAL HISTORY:  Social History   Socioeconomic History  . Marital status: Married    Spouse name: Not on file  . Number of children: 2  . Years of education: 4y college  . Highest education level: Not on file  Occupational History  . Occupation: Pediatric Nurse practitioner    Comment: Not working since [place] 2015  Social Needs  . Financial resource strain: Not on file  . Food insecurity:    Worry: Not on file    Inability: Not on file  . Transportation needs:    Medical: Not on file    Non-medical: Not on file  Tobacco Use  . Smoking status: Never Smoker  . Smokeless tobacco: Never Used  Substance and Sexual Activity  . Alcohol use: No    Frequency: Never  . Drug use: No  . Sexual activity: Yes    Birth control/protection: None    Comment: patient is a transgender on testosterone shots, no biological kids  Lifestyle  . Physical activity:    Days per week: Not on file    Minutes per session: Not on file  . Stress: Not on file  Relationships  . Social connections:    Talks on phone: Not on file    Gets together: Not on file    Attends religious service: Not on file    Active member of club or organization: Not on file    Attends meetings of clubs or organizations: Not on file    Relationship status: Not on  file  . Intimate partner violence:    Fear of current or ex partner: Not on file    Emotionally abused: Not on file    Physically abused: Not on file    Forced sexual activity: Not on file  Other Topics Concern  . Not on file  Social History  Narrative   Education 4 year college, former Therapist, sports X 15 years, pediatric nurse practitioner x 6 years, did NP degree from [employer] [place]. Relocated to [place] about 2 months ago from [place], [place]. Patient was in [place] for last 4 years and prior to that in [place]. His wife is working as Scientist, research (physical sciences) for [employer]. Patient is not working and applying for disability. They have 2 kids but no biologic children.     FAMILY HISTORY:  Family History  Problem Relation Age of Onset  . Stroke Maternal Grandfather        57  . Heart attack Maternal Grandfather   . Hypertension Mother   . Psoriasis Mother   . Cancer Paternal Grandfather   . Heart attack Paternal Grandfather   . Stroke Paternal Uncle        age 41  . Stroke Maternal Grandmother   . Congestive Heart Failure Maternal Grandmother   . Heart attack Maternal Grandmother   . Protein C deficiency Sister 3       Miscarriages    ALLERGIES:  is allergic to penicillin g; vancomycin; duloxetine; gabapentin; tegaderm ag mesh [silver]; acetaminophen; ibuprofen; nortriptyline; pregabalin; sulfa antibiotics; and sulfacetamide sodium-sulfur.  MEDICATIONS:  Current Outpatient Medications  Medication Sig Dispense Refill  . baclofen (LIORESAL) 20 MG tablet Take 1 tablet (20 mg total) by mouth 4 (four) times daily. 120 tablet 3  . betamethasone dipropionate (DIPROLENE) 0.05 % cream Apply topically 2 (two) times daily.    . Calcipotriene-Betameth Diprop (ENSTILAR) 0.005-0.064 % FOAM Apply 1 application topically 2 (two) times daily.     . clorazepate (TRANXENE) 15 MG tablet Take 15 mg by mouth at bedtime.    . clorazepate (TRANXENE) 7.5 MG tablet Take 7.5 mg by mouth 2 (two) times daily.     [name]  desonide (DESOWEN) 0.05 % ointment APPLY TO AFFECTED AREAS TWICE DAILY AS NEEDED FOR PSORIASIS.  2  . fluvoxaMINE (LUVOX) 25 MG tablet TAKE 1 TABLET DAILY FOR 1 WEEK THEN TAKE 1 TABLET TWICE DAILY  2  . morphine (KADIAN) 60 MG 24 hr capsule Take 1 capsule (60 mg total) by mouth every 12 (twelve) hours. 60 capsule 0  . neomycin-polymyxin-hydrocortisone (CORTISPORIN) 3.5-10000-1 OTIC suspension PLACE 4 DROPS INTO LEFT EAR TWICE A DAY AS NEEDED FOR PAIN AND DRAINAGE  1  . neomycin-polymyxin-hydrocortisone (CORTISPORIN) OTIC solution 4 (four) times daily.    . NONFORMULARY OR COMPOUNDED ITEM Apply 1 application topically 3 (three) times daily. 8% ketamine, 5% amitriptyline, 5% baclofen, 5% gabapentin  60 GM (Patient taking differently: Apply 1 application topically 3 (three) times daily as needed (pain). 8% ketamine, 5% amitriptyline, 5% baclofen, 5% gabapentin  60 GM) 1 each 5  . nortriptyline (PAMELOR) 10 MG capsule TAKE 1 TO 2 CAPSULES AT BEDTIME (Patient taking differently: 1) 60 capsule 1  . ondansetron (ZOFRAN) 4 MG tablet Take 1 tablet (4 mg total) by mouth every 8 (eight) hours as needed for nausea or vomiting. 30 tablet 0  . Oxycodone HCl 10 MG TABS Take 1-2 tablets (10-20 mg total) by mouth every 4 (four) hours as needed ((score 7 to 10)). 60 tablet 0  . potassium chloride (K-DUR) 10 MEQ tablet Take 1 tablet (10 mEq total) by mouth daily as needed. Take every time you take the Lasix (furosemide). 30 tablet 11  . prazosin (MINIPRESS) 1 MG capsule Take 1 mg by mouth at bedtime. Takes 1 mg and 2 mg to equal 3 mg at bedtime    .  prazosin (MINIPRESS) 2 MG capsule Take 2 mg by mouth at bedtime. Takes 1 mg and 2 mg to equal 3 mg at bedtime    . testosterone cypionate (DEPO-TESTOSTERONE) 200 MG/ML injection Inject 0.25 mLs (50 mg total) into the muscle once a week. (Patient taking differently: Inject 100 mg into the muscle once a week. Friday) 10 mL 0  . ustekinumab (STELARA) 90 MG/ML SOSY injection  Inject 90 mg into the skin every 3 (three) months. Last dose March 07, 2017    . venlafaxine XR (EFFEXOR-XR) 150 MG 24 hr capsule Take 300 mg by mouth daily with breakfast.    . XARELTO 20 MG TABS tablet TAKE 1 TABLET DAILY WITH SUPPER (Patient taking differently: TAKE 1 TABLET DAILY BEFORE BEDTIME) 90 tablet 1  . furosemide (LASIX) 20 MG tablet Take 20 mg daily. Take 1 extra 20 mg daily as needed for a weight gain of 3lbs in a day or 5 lbs in a week. (Patient not taking: Reported on 07/06/2017) 40 tablet 3   No current facility-administered medications for this visit.     REVIEW OF SYSTEMS:   Constitutional: Denies fevers, chills  Eyes: Denies blurriness of vision, double vision or watery eyes Ears, nose, mouth, throat, and face: Denies mucositis or sore throat  Respiratory: Denies cough, dyspnea or wheezes Cardiovascular: Denies palpitation, chest discomfort or lower extremity swelling Gastrointestinal:  Denies nausea  Skin: Hx of psoriasis and arthropathy from it.  Lymphatics: Denies new lymphadenopathy or easy bruising, hx of right parotitis twice. Neurological: (+) Numbness knees to feet MSK: sever back pain due to spinal compression, (+) splinting of left knee and lower right leg  Behavioral/Psych: Mood is stable, no new changes,  All other systems were reviewed with the patient and are negative.  PHYSICAL EXAMINATION: ECOG PERFORMANCE STATUS: 3  Vitals:   07/06/17 1120  BP: (!) 137/94  Pulse: 84  Resp: 19  Temp: 98.3 F (36.8 C)  TempSrc: Oral  SpO2: 98%    GENERAL:alert, no distress and comfortable, in a electric wheelchair  SKIN: skin color, texture, turgor are normal, no rashes or significant lesions EYES: normal, conjunctiva are pink and non-injected, sclera clear except small bleeding in the inner of right sclera  OROPHARYNX:no exudate, no erythema and lips, buccal mucosa, and tongue normal  NECK: supple, thyroid normal size, non-tender, without nodularity LYMPH:   no palpable lymphadenopathy in the cervical, axillary or inguinal LUNGS: clear to auscultation and percussion with normal breathing effort HEART: regular rate & rhythm and no murmurs and no lower extremity edema ABDOMEN:abdomen soft, non-tender and normal bowel sounds Musculoskeletal:no cyanosis of digits and no clubbing  PSYCH: alert & oriented x 3 NEURO: no focal motor/sensory deficits EXT: The surgical scar at the left knee has healed well. He wares braces for both leg. No pitting edema. CHEST: s/p bilateral mastectomy. He has soft tissue fullness in the left lateral area of the chest wall. There is multiple tiny subcutaneus nodules that resembles lumpy breast tissue.  LABORATORY DATA:  I have reviewed the data as listed CBC Latest Ref Rng & Units 07/06/2017 04/23/2017 04/22/2017  WBC 4.0 - 10.3 K/uL 4.1 7.1 5.3  Hemoglobin 13.0 - 17.1 g/dL 12.1(L) 9.5(L) 10.8(L)  Hematocrit 38.4 - 49.9 % 38.8 31.1(L) 35.1(L)  Platelets 140 - 400 K/uL 240 193 180   CMP Latest Ref Rng & Units 07/06/2017 04/23/2017 04/22/2017  Glucose 70 - 140 mg/dL 73 139(H) 95  BUN 7 - 26 mg/dL 18 18 18  Creatinine 0.70 - 1.30 mg/dL 1.20 1.03 1.09  Sodium 136 - 145 mmol/L 139 138 137  Potassium 3.5 - 5.1 mmol/L 4.6 4.3 3.7  Chloride 98 - 109 mmol/L 102 105 104  CO2 22 - 29 mmol/L 31(H) 26 24  Calcium 8.4 - 10.4 mg/dL 9.7 8.2(L) 8.5(L)  Total Protein 6.4 - 8.3 g/dL 7.5 - 6.5  Total Bilirubin 0.2 - 1.2 mg/dL 0.4 - 0.6  Alkaline Phos 40 - 150 U/L 244(H) - 261(H)  AST 5 - 34 U/L 63(H) - 49(H)  ALT 0 - 55 U/L 49 - 80(H)     Pathology report Diagnosis Bone Marrow, Aspirate,Biopsy, and Clot, right iliac - SLIGHTLY HYPERCELLULAR BONE MARROW FOR AGE WITH TRILINEAGE HEMATOPOIESIS. - SEE COMMENT. PERIPHERAL BLOOD: - NO SIGNIFICANT MORPHOLOGIC ABNORMALITIES. Diagnosis Note The bone marrow is slightly hypercellular with trilineage hematopoiesis and essentially orderly and progressive maturation of all cell types.  Significant dyspoiesis is not present. Iron stores are markedly decreased to absent and hence correlation with serum iron studies is recommended. There is no morphologic evidence of a myeloproliferative process in this material. Correlation with cytogenetic studies is recommended. (BNS:ecj 22-Apr-2014)   RADIOGRAPHIC STUDIES: No results found.  MRI Lumbar Spine WO Contrast 04/16/16 IMPRESSION: 1. Advanced L5-S1 disc degeneration with mild bilateral neural foraminal stenosis. 2. Shallow left foraminal disc protrusion at L3-4 and mild disc bulging at L4-5 without stenosis. 3. Mild multilevel posterior element hypertrophy  MRI Cervical Spine WO Contrast 04/16/16 IMPRESSION: 1. No significant interval change in size and appearance of syrinx extending from C4 through C6 as above. 2. Mild multilevel degenerative spondylolysis as above without significant canal or foraminal stenosis.   ASSESSMENT & PLAN: 52 y.o. transgender male  67. Secondary Polythythemia JAK2 (-) -I previously reviewed his bone marrow biopsy results with him in details. He has slightly hypercellular bone marrow with trilineage hematopoiesis.  No morphology evidence of myeloproliferative process. Cytogenetics was normal (no Y chromosome).  JAK2 mutation was negative. -He states he has had high H/H since his 20's. He had Hemoglobin 18.7 hematocrit 53.1 in 04/2013. His epo level was normal. His presentation is consistent with secondary polycythemia, possibly related to testosterone injections. 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 ECordelia Penadvise to low his testosterone injection dose. He was discharged from Dr. ESonny Dandyoffice.  Unfortunately he is not able to find an local endocrinologist who is willing to manage his testosterone injections. -We previously discussed that secondary polycythemia is usually benign, risk of thrombosis is relatively low  than PV. However he did have history of stroke, and he previously feels fatigued when his hemoglobin is high. He agrees to continue phlebotomy as needed. -Due to his multiple orthopedic surgeries, he has developed mild anemia, has not required phlebotomy lately. - I previously explained PV may cause splenomegaly, although I believe his polycythemia is likely secondary, unlikely.  Based on negative Jak 2 and previous bone marrow biopsy.  I recommend continuing with phlebotomy for HCT>45% and to monitor HCT every 3 months and continue with Xarelto.  -Labs reviewed, His HCT is 38.8 and he has elevated alkaline phosphatase and Liver enzymes. No need for phlebotomy today. Based on the MCV low he is mildly anemic, likely mild iron deficiency.  Would not suggest iron replacement, to avoid phlebotomy.  His anemia has mostly resolved spontaneously. -f/u in 6 months   2. History of DVT and stroke, Protein C deficiency  -  continue Xarelto indefinitely given his underlying protein C deficiency and history of DVT and stroke. His previous DVT was provoked by foot injury. -Continue Xarelto indefinitely. Okay to hold for 1-2 days before joint injection by his rheumatologist. -I suggested compression socks when traveling and to be active if he can.   3. History of CVA  4. 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 I prefer him to maintain low normal level of testosterone.  -He is using 75 mg testosterone weekly, currently he does not have a prescriber so he buying it and following endocrinology guidelines online.   5. RA -He is on Secukinumab.  6. Elevated alkaline phosphatase and transaminitis -He has intermittent mild transaminitis, resolved now. He also has significantly elevated alkaline phosphatase  -His primary care physician has noticed the above change and will follow up on  that.  -He will continue to follow his GI for this.   7. Back Pain, knee pain and muscular spasms -He is on morphine 60 mg every 12 hours and oxycodone 53m as needed, and baclofen -He will follow-up with his pain specialist Dr. SNaaman Plummerfor his pain management   8. Mild splenomegaly -His recent abdominal ultrasound showed mild splenomegaly, no lesions in the spleen -He is currently undergoing GI workup to rule out early liver cirrhosis  9.  Gynecomastia  -He has soft tissue fullness in the left lateral area of the chest wall. There is multiple tiny subcutaneus nodules that resembles lumpy breast tissue. However, he s/p bilateral mastectomies so there is not a concern for breast cancer. I ordered a [place]ato evaluate the area.   PLAN: -labs and f/u in 6 months  -She sees other physicians with lab every a few months, will call me if her hemoglobin more than 45% -Continue Xarelto [facility]   All questions were answered. The patient knows to call the clinic with any problems, questions or concerns.  I spent 30 minutes counseling the patient face to face. The total time spent in the appointment was 35 minutes and more than 50% was on counseling.  This document serves as a record of services personally performed by YTruitt Merle MD. It was created on her behalf by DTheresia Bough a trained medical scribe. The creation of this record is based on the scribe's personal observations and the provider's statements to them.   I have reviewed the above documentation for accuracy and completeness, and I agree with the above.    YTruitt Merle 07/06/2017 6:02 PM

## 2017-07-02 ENCOUNTER — Ambulatory Visit: Payer: BLUE CROSS/BLUE SHIELD | Admitting: Psychology

## 2017-07-03 ENCOUNTER — Ambulatory Visit: Payer: BLUE CROSS/BLUE SHIELD | Admitting: Cardiovascular Disease

## 2017-07-05 ENCOUNTER — Other Ambulatory Visit: Payer: Self-pay | Admitting: Hematology

## 2017-07-05 DIAGNOSIS — D500 Iron deficiency anemia secondary to blood loss (chronic): Secondary | ICD-10-CM

## 2017-07-05 DIAGNOSIS — D509 Iron deficiency anemia, unspecified: Secondary | ICD-10-CM | POA: Insufficient documentation

## 2017-07-06 ENCOUNTER — Inpatient Hospital Stay: Payer: BLUE CROSS/BLUE SHIELD

## 2017-07-06 ENCOUNTER — Telehealth: Payer: Self-pay

## 2017-07-06 ENCOUNTER — Inpatient Hospital Stay: Payer: BLUE CROSS/BLUE SHIELD | Attending: Hematology | Admitting: Hematology

## 2017-07-06 ENCOUNTER — Encounter: Payer: Self-pay | Admitting: Hematology

## 2017-07-06 ENCOUNTER — Telehealth: Payer: Self-pay | Admitting: Hematology

## 2017-07-06 VITALS — BP 137/94 | HR 84 | Temp 98.3°F | Resp 19

## 2017-07-06 DIAGNOSIS — D500 Iron deficiency anemia secondary to blood loss (chronic): Secondary | ICD-10-CM

## 2017-07-06 DIAGNOSIS — M545 Low back pain: Secondary | ICD-10-CM | POA: Diagnosis not present

## 2017-07-06 DIAGNOSIS — F640 Transsexualism: Secondary | ICD-10-CM | POA: Diagnosis not present

## 2017-07-06 DIAGNOSIS — D751 Secondary polycythemia: Secondary | ICD-10-CM | POA: Diagnosis present

## 2017-07-06 DIAGNOSIS — N62 Hypertrophy of breast: Secondary | ICD-10-CM | POA: Insufficient documentation

## 2017-07-06 DIAGNOSIS — M069 Rheumatoid arthritis, unspecified: Secondary | ICD-10-CM | POA: Insufficient documentation

## 2017-07-06 DIAGNOSIS — Z8673 Personal history of transient ischemic attack (TIA), and cerebral infarction without residual deficits: Secondary | ICD-10-CM | POA: Diagnosis not present

## 2017-07-06 DIAGNOSIS — Z789 Other specified health status: Secondary | ICD-10-CM

## 2017-07-06 DIAGNOSIS — Z86718 Personal history of other venous thrombosis and embolism: Secondary | ICD-10-CM | POA: Diagnosis not present

## 2017-07-06 DIAGNOSIS — R161 Splenomegaly, not elsewhere classified: Secondary | ICD-10-CM | POA: Diagnosis not present

## 2017-07-06 LAB — CBC WITH DIFFERENTIAL/PLATELET
BASOS ABS: 0 10*3/uL (ref 0.0–0.1)
BASOS PCT: 0 %
Eosinophils Absolute: 0 10*3/uL (ref 0.0–0.5)
Eosinophils Relative: 0 %
HCT: 38.8 % (ref 38.4–49.9)
Hemoglobin: 12.1 g/dL — ABNORMAL LOW (ref 13.0–17.1)
Lymphocytes Relative: 23 %
Lymphs Abs: 1 10*3/uL (ref 0.9–3.3)
MCH: 22.2 pg — ABNORMAL LOW (ref 27.2–33.4)
MCHC: 31.2 g/dL — ABNORMAL LOW (ref 32.0–36.0)
MCV: 71.3 fL — ABNORMAL LOW (ref 79.3–98.0)
MONO ABS: 0.3 10*3/uL (ref 0.1–0.9)
MONOS PCT: 8 %
NEUTROS ABS: 2.8 10*3/uL (ref 1.5–6.5)
NEUTROS PCT: 69 %
PLATELETS: 240 10*3/uL (ref 140–400)
RBC: 5.43 MIL/uL (ref 4.20–5.82)
RDW: 17.1 % — ABNORMAL HIGH (ref 11.0–14.6)
WBC: 4.1 10*3/uL (ref 4.0–10.3)

## 2017-07-06 LAB — COMPREHENSIVE METABOLIC PANEL
ALBUMIN: 4.6 g/dL (ref 3.5–5.0)
ALK PHOS: 244 U/L — AB (ref 40–150)
ALT: 49 U/L (ref 0–55)
ANION GAP: 6 (ref 3–11)
AST: 63 U/L — ABNORMAL HIGH (ref 5–34)
BUN: 18 mg/dL (ref 7–26)
CALCIUM: 9.7 mg/dL (ref 8.4–10.4)
CHLORIDE: 102 mmol/L (ref 98–109)
CO2: 31 mmol/L — AB (ref 22–29)
CREATININE: 1.2 mg/dL (ref 0.70–1.30)
GFR calc non Af Amer: >60 mL/min (ref >60)
GLUCOSE: 73 mg/dL (ref 70–140)
Potassium: 4.6 mmol/L (ref 3.5–5.1)
SODIUM: 139 mmol/L (ref 136–145)
Total Bilirubin: 0.4 mg/dL (ref 0.2–1.2)
Total Protein: 7.5 g/dL (ref 6.4–8.3)

## 2017-07-06 LAB — IRON AND TIBC
IRON: 23 ug/dL — AB (ref 42–163)
SATURATION RATIOS: 4 % — AB (ref 42–163)
TIBC: 513 ug/dL — AB (ref 202–409)
UIBC: 490 ug/dL

## 2017-07-06 LAB — FERRITIN: FERRITIN: 12 ng/mL — AB (ref 22–316)

## 2017-07-06 NOTE — Telephone Encounter (Signed)
Scheduled appt per 5/6 los - pt is aware of appts - no print out wanted. My chart active.

## 2017-07-06 NOTE — Telephone Encounter (Signed)
Attempted to call Dr. Wilhemina Bonito office at [facility], held x 2 for at least 10 minutes each.  Inquiring if this MD will see this patient transgender for hormone management.   Will try again.

## 2017-07-07 ENCOUNTER — Telehealth: Payer: Self-pay

## 2017-07-07 NOTE — Telephone Encounter (Signed)
After callilng Dr. Raul Del office at [facility] I was told she is accepting new patients and he does not need a referral.  Spoke with patient instructed him of the above and gave him #[phone] to call for an appointment.  Patient verbalized an understanding.

## 2017-07-08 ENCOUNTER — Ambulatory Visit
Admission: RE | Admit: 2017-07-08 | Discharge: 2017-07-08 | Disposition: A | Discharge status: Home / Self Care | Payer: BLUE CROSS/BLUE SHIELD | Source: Ambulatory Visit | Attending: Physical Medicine & Rehabilitation | Admitting: Physical Medicine & Rehabilitation

## 2017-07-08 DIAGNOSIS — G959 Disease of spinal cord, unspecified: Secondary | ICD-10-CM

## 2017-07-09 ENCOUNTER — Telehealth: Payer: Self-pay

## 2017-07-09 ENCOUNTER — Ambulatory Visit (INDEPENDENT_AMBULATORY_CARE_PROVIDER_SITE_OTHER): Payer: BLUE CROSS/BLUE SHIELD | Admitting: Psychology

## 2017-07-09 DIAGNOSIS — F4310 Post-traumatic stress disorder, unspecified: Secondary | ICD-10-CM | POA: Diagnosis not present

## 2017-07-09 NOTE — Telephone Encounter (Signed)
-----   Message from [name], MD sent at 07/09/2017  9:34 AM EDT ----- Please let pt know his iron level is low, and I suggest him to eat iron rich food, such as red meat, chicken and salmon etc. Ok to take MVI with iron, but no iron pills due to his polycythemia. Thanks  [name]  07/09/2017

## 2017-07-09 NOTE — Telephone Encounter (Signed)
Called patient

## 2017-07-09 NOTE — Telephone Encounter (Signed)
Called patient left voice message for patient per Dr. Burr Medico instructed the iron level is low, eat iron rich food such as red meat, chicken, salmon, take a multivitamin with iron, but no iron pills due to polycythemia.

## 2017-07-10 ENCOUNTER — Encounter: Payer: Self-pay | Admitting: Hematology

## 2017-07-10 ENCOUNTER — Telehealth: Payer: Self-pay | Admitting: *Deleted

## 2017-07-10 ENCOUNTER — Telehealth: Payer: Self-pay

## 2017-07-10 ENCOUNTER — Encounter: Payer: Self-pay | Admitting: Physical Medicine & Rehabilitation

## 2017-07-10 ENCOUNTER — Other Ambulatory Visit: Payer: Self-pay | Admitting: Hematology

## 2017-07-10 DIAGNOSIS — N62 Hypertrophy of breast: Secondary | ICD-10-CM

## 2017-07-10 NOTE — Telephone Encounter (Signed)
I order left breast US for him on his last visit. Tonya, could you contact breast center to see if they can schedule for him? Thanks   [name] MD

## 2017-07-10 NOTE — Telephone Encounter (Signed)
Pt called requesting to have his Cervical spine MRI release to his My Chart so he can get a copy.

## 2017-07-10 NOTE — Telephone Encounter (Signed)
Didn't know I had the ability to release it.

## 2017-07-10 NOTE — Telephone Encounter (Signed)
Message from pt reporting he has not heard from radiology to schedule ultrasound. He also wants Dr. Burr Medico to be aware that he has been on "a slew of biological medications for past 9 years." Pt's dermatologist recommended he make Dr. Burr Medico aware due to the increased risk for lymphoma on these meds. He is on Stelara currently. Has taken Humira and several other agents.   Message to MD for review.

## 2017-07-14 ENCOUNTER — Other Ambulatory Visit: Payer: Self-pay | Admitting: Hematology

## 2017-07-14 ENCOUNTER — Ambulatory Visit (INDEPENDENT_AMBULATORY_CARE_PROVIDER_SITE_OTHER): Payer: BLUE CROSS/BLUE SHIELD | Admitting: Psychology

## 2017-07-14 DIAGNOSIS — F4312 Post-traumatic stress disorder, chronic: Secondary | ICD-10-CM

## 2017-07-14 DIAGNOSIS — N62 Hypertrophy of breast: Secondary | ICD-10-CM

## 2017-07-16 ENCOUNTER — Ambulatory Visit (INDEPENDENT_AMBULATORY_CARE_PROVIDER_SITE_OTHER): Payer: BLUE CROSS/BLUE SHIELD | Admitting: Psychology

## 2017-07-16 DIAGNOSIS — F4310 Post-traumatic stress disorder, unspecified: Secondary | ICD-10-CM | POA: Diagnosis not present

## 2017-07-17 ENCOUNTER — Ambulatory Visit
Admission: RE | Admit: 2017-07-17 | Discharge: 2017-07-17 | Disposition: A | Discharge status: Home / Self Care | Payer: BLUE CROSS/BLUE SHIELD | Source: Ambulatory Visit | Attending: Hematology | Admitting: Hematology

## 2017-07-17 ENCOUNTER — Ambulatory Visit: Payer: Self-pay | Admitting: Physical Medicine & Rehabilitation

## 2017-07-17 DIAGNOSIS — N62 Hypertrophy of breast: Secondary | ICD-10-CM

## 2017-07-20 ENCOUNTER — Telehealth: Payer: Self-pay

## 2017-07-20 ENCOUNTER — Other Ambulatory Visit: Payer: Self-pay | Admitting: Physical Medicine & Rehabilitation

## 2017-07-20 DIAGNOSIS — M792 Neuralgia and neuritis, unspecified: Secondary | ICD-10-CM

## 2017-07-20 NOTE — Telephone Encounter (Signed)
Called patient informed per Dr. Burr Medico mammogram was negative, benign.  Patient verbalized an understanding.

## 2017-07-20 NOTE — Telephone Encounter (Signed)
-----   Message from [name], MD sent at 07/20/2017  8:05 AM EDT ----- Please make sure pt knows the result, thanks   [name]  07/20/2017

## 2017-07-21 ENCOUNTER — Encounter: Payer: BLUE CROSS/BLUE SHIELD | Attending: Physical Medicine & Rehabilitation | Admitting: Registered Nurse

## 2017-07-21 ENCOUNTER — Encounter: Payer: Self-pay | Admitting: Registered Nurse

## 2017-07-21 ENCOUNTER — Ambulatory Visit (INDEPENDENT_AMBULATORY_CARE_PROVIDER_SITE_OTHER): Payer: BLUE CROSS/BLUE SHIELD | Admitting: Psychology

## 2017-07-21 ENCOUNTER — Ambulatory Visit: Payer: BLUE CROSS/BLUE SHIELD | Admitting: Physical Medicine & Rehabilitation

## 2017-07-21 VITALS — BP 129/76 | HR 101 | Resp 14 | Ht 69.0 in | Wt 230.0 lb

## 2017-07-21 DIAGNOSIS — M7062 Trochanteric bursitis, left hip: Secondary | ICD-10-CM

## 2017-07-21 DIAGNOSIS — G950 Syringomyelia and syringobulbia: Secondary | ICD-10-CM | POA: Diagnosis not present

## 2017-07-21 DIAGNOSIS — M7522 Bicipital tendinitis, left shoulder: Secondary | ICD-10-CM | POA: Diagnosis not present

## 2017-07-21 DIAGNOSIS — F4312 Post-traumatic stress disorder, chronic: Secondary | ICD-10-CM | POA: Diagnosis not present

## 2017-07-21 DIAGNOSIS — M5416 Radiculopathy, lumbar region: Secondary | ICD-10-CM

## 2017-07-21 DIAGNOSIS — M545 Low back pain, unspecified: Secondary | ICD-10-CM

## 2017-07-21 DIAGNOSIS — M170 Bilateral primary osteoarthritis of knee: Secondary | ICD-10-CM

## 2017-07-21 DIAGNOSIS — Z7901 Long term (current) use of anticoagulants: Secondary | ICD-10-CM | POA: Diagnosis not present

## 2017-07-21 DIAGNOSIS — Z79899 Other long term (current) drug therapy: Secondary | ICD-10-CM | POA: Insufficient documentation

## 2017-07-21 DIAGNOSIS — I69398 Other sequelae of cerebral infarction: Secondary | ICD-10-CM | POA: Diagnosis not present

## 2017-07-21 DIAGNOSIS — Z79891 Long term (current) use of opiate analgesic: Secondary | ICD-10-CM | POA: Insufficient documentation

## 2017-07-21 DIAGNOSIS — M13862 Other specified arthritis, left knee: Secondary | ICD-10-CM | POA: Diagnosis not present

## 2017-07-21 DIAGNOSIS — Z9181 History of falling: Secondary | ICD-10-CM | POA: Diagnosis not present

## 2017-07-21 DIAGNOSIS — S43002A Unspecified subluxation of left shoulder joint, initial encounter: Secondary | ICD-10-CM | POA: Insufficient documentation

## 2017-07-21 DIAGNOSIS — L4050 Arthropathic psoriasis, unspecified: Secondary | ICD-10-CM | POA: Diagnosis not present

## 2017-07-21 DIAGNOSIS — M7061 Trochanteric bursitis, right hip: Secondary | ICD-10-CM

## 2017-07-21 DIAGNOSIS — F418 Other specified anxiety disorders: Secondary | ICD-10-CM | POA: Diagnosis not present

## 2017-07-21 DIAGNOSIS — G8929 Other chronic pain: Secondary | ICD-10-CM | POA: Diagnosis present

## 2017-07-21 DIAGNOSIS — R209 Unspecified disturbances of skin sensation: Secondary | ICD-10-CM | POA: Insufficient documentation

## 2017-07-21 DIAGNOSIS — D6859 Other primary thrombophilia: Secondary | ICD-10-CM | POA: Diagnosis not present

## 2017-07-21 DIAGNOSIS — I69351 Hemiplegia and hemiparesis following cerebral infarction affecting right dominant side: Secondary | ICD-10-CM | POA: Insufficient documentation

## 2017-07-21 DIAGNOSIS — I69320 Aphasia following cerebral infarction: Secondary | ICD-10-CM | POA: Insufficient documentation

## 2017-07-21 DIAGNOSIS — Z5181 Encounter for therapeutic drug level monitoring: Secondary | ICD-10-CM

## 2017-07-21 DIAGNOSIS — Z87890 Personal history of sex reassignment: Secondary | ICD-10-CM | POA: Diagnosis not present

## 2017-07-21 DIAGNOSIS — D751 Secondary polycythemia: Secondary | ICD-10-CM | POA: Insufficient documentation

## 2017-07-21 DIAGNOSIS — G894 Chronic pain syndrome: Secondary | ICD-10-CM

## 2017-07-21 DIAGNOSIS — Z96652 Presence of left artificial knee joint: Secondary | ICD-10-CM

## 2017-07-21 DIAGNOSIS — K900 Celiac disease: Secondary | ICD-10-CM | POA: Insufficient documentation

## 2017-07-21 DIAGNOSIS — Z86718 Personal history of other venous thrombosis and embolism: Secondary | ICD-10-CM | POA: Diagnosis not present

## 2017-07-21 MED ORDER — OXYCODONE HCL 10 MG PO TABS: 10.0000 mg | ORAL_TABLET | Freq: Three times a day (TID) | ORAL | 0 refills | Status: DC | PRN

## 2017-07-21 MED ORDER — MORPHINE SULFATE ER 60 MG PO CP24: 60.0000 mg | ORAL_CAPSULE | Freq: Two times a day (BID) | ORAL | 0 refills | Status: DC

## 2017-07-21 NOTE — Progress Notes (Signed)
Subjective:    Patient ID: Jesse Bowers, adult    DOB: 06/20/1965, 52 y.o.   MRN: [id number]  HPI: Jesse Bowers is a 52 year old male who returns for follow up appointment for chronic pain and medication refill. He states his pain is located in his lower back radiating into his bilateral lower extremities, bilateral hips and left knee. She rates her pain 9. Her current exercise regime is attending physical therapy twice a week and performing stretching exercises.   Jesse Bowers is 120.00 MME, upon reviewing the PMP Aware Web-site, last Oxycodone was filled on 04/30/2017. Also called his pharmacy and spoke to the pharmacist, he states last fill was on 04/30/2017. Jesse Bowers adamant about taking his medication as prescribed, his wife dispenses his medication he reports.  Jesse Bowers will speak with his wife, and send [place] a message regarding the above, he verbalizes understanding.   Last Oral Swab was Performed on 03/24/2017, it was consistent.   Pain Inventory Average Pain 6 Pain Right Now 9 My pain is constant  In the last 24 hours, has pain interfered with the following? General activity 8 Relation with others 10 Enjoyment of life 10 What TIME of day is your pain at its worst? morning , night Sleep (in general) Poor  Pain is worse with: walking and standing Pain improves with: medication and injections Relief from Meds: 9  Mobility how many minutes can you walk? 5-10 ability to climb steps?  yes do you drive?  no use a wheelchair  Function disabled: date disabled .  Neuro/Psych weakness trouble walking spasms confusion anxiety  Prior Studies Any changes since last visit?  no  Physicians involved in your care Any changes since last visit?  no   Family History  Problem Relation Age of Onset  . Stroke Maternal Grandfather        76  . Heart attack Maternal Grandfather   . Hypertension Mother   . Psoriasis Mother   . Cancer Paternal Grandfather    . Heart attack Paternal Grandfather   . Stroke Paternal Uncle        age 83  . Stroke Maternal Grandmother   . Congestive Heart Failure Maternal Grandmother   . Heart attack Maternal Grandmother   . Protein C deficiency Sister 74       Miscarriages   Social History   Socioeconomic History  . Marital status: Married    Spouse name: Not on file  . Number of children: 2  . Years of education: 4y college  . Highest education level: Not on file  Occupational History  . Occupation: Pediatric Nurse practitioner    Comment: Not working since [place] 2015  Social Needs  . Financial resource strain: Not on file  . Food insecurity:    Worry: Not on file    Inability: Not on file  . Transportation needs:    Medical: Not on file    Non-medical: Not on file  Tobacco Use  . Smoking status: Never Smoker  . Smokeless tobacco: Never Used  Substance and Sexual Activity  . Alcohol use: No    Frequency: Never  . Drug use: No  . Sexual activity: Yes    Birth control/protection: None    Comment: patient is a transgender on testosterone shots, no biological kids  Lifestyle  . Physical activity:    Days per week: Not on file    Minutes per session: Not on file  . Stress: Not  on file  Relationships  . Social connections:    Talks on phone: Not on file    Gets together: Not on file    Attends religious service: Not on file    Active member of club or organization: Not on file    Attends meetings of clubs or organizations: Not on file    Relationship status: Not on file  Other Topics Concern  . Not on file  Social History Narrative   Education 4 year college, former Therapist, sports X 15 years, pediatric nurse practitioner x 6 years, did NP degree from [employer] [place]. Relocated to [place] about 2 months ago from [place], [place]. Patient was in [place] for last 4 years and prior to that in [place]. His wife is working as Scientist, research (physical sciences) for [employer]. Patient is not working and applying for  disability. They have 2 kids but no biologic children.    Past Surgical History:  Procedure Laterality Date  . ABDOMINAL HYSTERECTOMY     TAH, BSO- tranverse incision.  [name] ANKLE ARTHROSCOPY WITH RECONSTRUCTION Right 2007  . CHOLECYSTECTOMY     laparoscopic  . HIP ARTHROSCOPY W/ LABRAL REPAIR Right 05/11/2013  . KNEE SURGERY Bilateral 1984   Right ACL, left PCL repair  . LIVER BIOPSY  2013   normal results.  [name] NASAL SEPTUM SURGERY N/A 09/20/2015  . THYROIDECTOMY, PARTIAL  2008  . TOTAL KNEE REVISION Left 02/06/2016   Procedure: LEFT TOTAL KNEE REVISION;  Surgeon: [name], MD;  Location: [facility];  Service: Orthopedics;  Laterality: Left;  . TOTAL KNEE REVISION Left 04/22/2017   Procedure: Left knee polyethylene revision;  Surgeon: [name], MD;  Location: [facility];  Service: Orthopedics;  Laterality: Left;   Past Medical History:  Diagnosis Date  . Abnormal weight loss   . Anxiety    PTSD per patient  . Celiac disease   . Cervical neck pain with evidence of disc disease    patient has a cyst   . Degenerative disc disease at L5-S1 level   . Family history of adverse reaction to anesthesia    family has problems with anesthesia of nausea and vomiting   . Gender bias    prefers Male gender identity- "remains with vagina"  . Gluten enteropathy   . H/O parotitis    right   . H/O protein C deficiency    Dr. Anne Fu  . History of kidney stones   . Hx-TIA (transient ischemic attack)    2015  . Neck pain   . Neuromuscular disorder ([place])    bilateral neuropathy feet.  . Polycythemia, secondary   . PONV (postoperative nausea and vomiting)   . Psoriasis   . psoriatic arthritis   . Sleep apnea    cpap use- Dr. [name] follows  . Stroke [facility])    Stroke left side -slight right sided weakness-Dr. [name] follows  . Syrinx of spinal cord ([place]) 01/06/2014   c spine on MRI  . Tachycardia    hx of   . Transfusion history    past history- none recent  . Transgendered    BP  129/76 (BP Location: Right Arm, Patient Position: Sitting, Cuff Size: Normal)   Pulse (!) 101   Resp 14   Ht 5' 9"  (1.753 m)   Wt 230 lb (104.3 kg)   SpO2 98%   BMI 33.97 kg/m   Opioid Risk Score:   Fall Risk Score:  `1  Depression screen PHQ 2/9  Depression screen  [facility] 2/9 06/24/2017 02/18/2017 11/10/2016 09/15/2016  Decreased Interest 0 1 0 0  Down, Depressed, Hopeless 0 1 0 0  PHQ - 2 Score 0 2 0 0  Altered sleeping 0 - - -  Tired, decreased energy 0 - - -  Change in appetite 0 - - -  Feeling bad or failure about yourself  0 - - -  Trouble concentrating 0 - - -  Moving slowly or fidgety/restless 0 - - -  Suicidal thoughts 0 - - -  PHQ-9 Score 0 - - -  Some recent data might be hidden    Review of Systems  Constitutional: Negative.   HENT: Negative.   Eyes: Negative.   Respiratory: Negative.   Cardiovascular: Negative.   Gastrointestinal: Negative.   Endocrine: Negative.   Genitourinary: Negative.   Musculoskeletal: Positive for arthralgias, back pain, gait problem and neck pain.       Spasms   Skin: Negative.   Psychiatric/Behavioral: The patient is nervous/anxious.   All other systems reviewed and are negative.      Objective:   Physical Exam  Constitutional: He is oriented to person, place, and time. He appears well-developed and well-nourished.  HENT:  Head: Normocephalic and atraumatic.  Neck: Normal range of motion. Neck supple.  Cardiovascular: Normal rate and regular rhythm.  Pulmonary/Chest: Effort normal and breath sounds normal.  Musculoskeletal:  Normal Muscle Bulk and Muscle Testing Reveals: Upper Extremities: Full ROM and Muscle Strength 5/5 Thoracic Paraspinal Tenderness: T-1-T-3 Lumbar Paraspinal Tenderness: L-4-L-5 Lower Extremities: Decreased ROM and Muscle Strength 4/5 Bilateral Lower Extremities Flexion Produces Pain into Bilateral Patella's Bilateral AFO Arrived in wheelchair   Neurological: He is alert and oriented to person, place,  and time.  Skin: Skin is warm and dry.  Psychiatric: He has a normal mood and affect.  Nursing note and vitals reviewed.         Assessment & Plan:  1. Psoriatic arthritis with pain in multiple areas, most prominently feet, hands, elbows. Refilled:Kadian 60 mg 24 hr. Capsule, one capsule every 12 hours #60 and Oxycodone 10 mg one tablet every 8 hours as needed #90. 07/21/2017 We will continue the opioid monitoring program, this consists of regular clinic visits, examinations, urine drug screen, pill counts as well as use of [place] Controlled Substance Reporting System.  Rheumatology Following.  2. Chronic Pain Syndrome: Continue Compound Cream. 07/21/2017 3. 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 Patello-femoral arthritis left knee: Continue to monitor. 07/21/2017 S/P TKR on 07/03/14: Ortho Following: Dr. Wynelle Link perform Total Left Knee Revision on 02/06/2016. Scheduled for Left Knee Revision on 04/22/17.   5. Chronic mid- low back pain: Continue current medication regime, and encourage to increase activity as tolerated.07/21/2017 6. Polycythemia: Oncology Following. 07/21/2017 7. Depression with anxiety : Continue to Monitor. 07/21/2017 8. Cervicalgia: Continue HEP as tolerated  and Continue to Monitor. Continue current medication regime. 03/24/2017 9. Muscle Spasm: Continue Flexeril. 03/24/2017 10. Bilateral Ankle Pain: No complaints today. Continue to Monitor. 07/21/2017  10. Left lateral epicondylitis: No complaints today.Ortho Following.07/21/2017 11. Left Shoulder Pain: No complaints today. Ortho Following. 07/21/2017 12. Lumbar Radiculitis: S/P ESI with Dr. Letta Pate. 10/06/2016, good relief noted.03/24/2017 13.Bilateral Greater Trochanteric Bursitis: Continue with Ice and Heat Therapy: Contiue HEP as Tolerated. Continue to  Monitor. 07/21/2017.  20 minutes of face to face patient care time was spent during this visit. All  questions were encouraged and answered.  F/U in 1 month

## 2017-07-23 ENCOUNTER — Ambulatory Visit: Payer: BLUE CROSS/BLUE SHIELD | Admitting: Psychology

## 2017-07-23 ENCOUNTER — Encounter: Payer: Self-pay | Admitting: Physician Assistant

## 2017-07-23 ENCOUNTER — Encounter: Payer: Self-pay | Admitting: Registered Nurse

## 2017-07-24 ENCOUNTER — Encounter: Payer: Self-pay | Admitting: Registered Nurse

## 2017-07-24 ENCOUNTER — Other Ambulatory Visit: Payer: Self-pay | Admitting: Physician Assistant

## 2017-07-24 MED ORDER — FUROSEMIDE 40 MG PO TABS: 40.0000 mg | ORAL_TABLET | Freq: Every day | ORAL | 6 refills | Status: DC

## 2017-07-24 MED ORDER — POTASSIUM CHLORIDE ER 20 MEQ PO TBCR: 20.0000 meq | EXTENDED_RELEASE_TABLET | Freq: Every day | ORAL | 6 refills | Status: DC

## 2017-07-24 NOTE — Progress Notes (Signed)
See patient email response.  Rosaria Ferries, PA-C 07/24/2017 2:02 PM Beeper [phone]

## 2017-07-28 ENCOUNTER — Encounter: Payer: Self-pay | Admitting: Registered Nurse

## 2017-07-28 ENCOUNTER — Ambulatory Visit (INDEPENDENT_AMBULATORY_CARE_PROVIDER_SITE_OTHER): Payer: BLUE CROSS/BLUE SHIELD | Admitting: Psychology

## 2017-07-28 DIAGNOSIS — F4312 Post-traumatic stress disorder, chronic: Secondary | ICD-10-CM

## 2017-07-29 NOTE — Telephone Encounter (Signed)
Please see my earlier response. He needs BMET in 3-4 weeks,  Ok to get LFTs (not a CMET) at that time. However, he needs to be specific about what is needed to make sure we get everything.  Please make sure he is taking Lasix 40 mg and Kdur 20 meq daily. Thanks

## 2017-07-30 ENCOUNTER — Ambulatory Visit (INDEPENDENT_AMBULATORY_CARE_PROVIDER_SITE_OTHER): Payer: BLUE CROSS/BLUE SHIELD | Admitting: Psychology

## 2017-07-30 DIAGNOSIS — F4310 Post-traumatic stress disorder, unspecified: Secondary | ICD-10-CM | POA: Diagnosis not present

## 2017-07-31 ENCOUNTER — Other Ambulatory Visit: Payer: Self-pay

## 2017-07-31 ENCOUNTER — Encounter: Payer: Self-pay | Admitting: Hematology

## 2017-07-31 ENCOUNTER — Encounter: Payer: Self-pay | Admitting: Physical Medicine & Rehabilitation

## 2017-07-31 DIAGNOSIS — D751 Secondary polycythemia: Secondary | ICD-10-CM

## 2017-07-31 MED ORDER — RIVAROXABAN 20 MG PO TABS: ORAL_TABLET | ORAL | 6 refills | Status: DC

## 2017-07-31 NOTE — Telephone Encounter (Signed)
Left message for patient to contact office.

## 2017-08-03 ENCOUNTER — Telehealth: Payer: Self-pay | Admitting: Physician Assistant

## 2017-08-03 DIAGNOSIS — I5032 Chronic diastolic (congestive) heart failure: Secondary | ICD-10-CM

## 2017-08-03 NOTE — Telephone Encounter (Signed)
New message    Patient is returning call from Friday

## 2017-08-03 NOTE — Telephone Encounter (Signed)
Return called to pt who states he is returning a call to [place]. Advised that I didn't see any notation but will send message to have her give him a call back. Messaged routed.

## 2017-08-04 ENCOUNTER — Encounter: Payer: Self-pay | Admitting: Hematology

## 2017-08-05 ENCOUNTER — Encounter: Payer: Self-pay | Admitting: *Deleted

## 2017-08-05 NOTE — Telephone Encounter (Signed)
Spoke with patient about Rhonda's recommendations (patient mychart email message)   Rhonda's Message Please see my earlier response. He needs BMET in 3-4 weeks,  Ok to get LFTs (not a CMET) at that time. However, he needs to be specific about what is needed to make sure we get everything.  Please make sure he is taking Lasix 40 mg and Kdur 20 meq daily. Thanks  Patient voiced understanding ans stated he will come in tomorrow to have labs drawn.

## 2017-08-07 ENCOUNTER — Ambulatory Visit (HOSPITAL_BASED_OUTPATIENT_CLINIC_OR_DEPARTMENT_OTHER): Payer: BLUE CROSS/BLUE SHIELD | Admitting: Physical Medicine & Rehabilitation

## 2017-08-07 ENCOUNTER — Encounter: Payer: Self-pay | Admitting: Physical Medicine & Rehabilitation

## 2017-08-07 ENCOUNTER — Other Ambulatory Visit: Payer: Self-pay

## 2017-08-07 VITALS — BP 148/88 | HR 98 | Ht 69.0 in | Wt 230.0 lb

## 2017-08-07 DIAGNOSIS — K900 Celiac disease: Secondary | ICD-10-CM | POA: Diagnosis not present

## 2017-08-07 DIAGNOSIS — L4050 Arthropathic psoriasis, unspecified: Secondary | ICD-10-CM | POA: Insufficient documentation

## 2017-08-07 DIAGNOSIS — I69398 Other sequelae of cerebral infarction: Secondary | ICD-10-CM | POA: Insufficient documentation

## 2017-08-07 DIAGNOSIS — Z87890 Personal history of sex reassignment: Secondary | ICD-10-CM | POA: Diagnosis not present

## 2017-08-07 DIAGNOSIS — I69320 Aphasia following cerebral infarction: Secondary | ICD-10-CM | POA: Insufficient documentation

## 2017-08-07 DIAGNOSIS — D6859 Other primary thrombophilia: Secondary | ICD-10-CM | POA: Diagnosis not present

## 2017-08-07 DIAGNOSIS — Z9181 History of falling: Secondary | ICD-10-CM | POA: Insufficient documentation

## 2017-08-07 DIAGNOSIS — G894 Chronic pain syndrome: Secondary | ICD-10-CM | POA: Diagnosis not present

## 2017-08-07 DIAGNOSIS — Z79891 Long term (current) use of opiate analgesic: Secondary | ICD-10-CM | POA: Insufficient documentation

## 2017-08-07 DIAGNOSIS — Z79899 Other long term (current) drug therapy: Secondary | ICD-10-CM | POA: Diagnosis not present

## 2017-08-07 DIAGNOSIS — M7522 Bicipital tendinitis, left shoulder: Secondary | ICD-10-CM | POA: Insufficient documentation

## 2017-08-07 DIAGNOSIS — M545 Low back pain: Secondary | ICD-10-CM | POA: Diagnosis not present

## 2017-08-07 DIAGNOSIS — R209 Unspecified disturbances of skin sensation: Secondary | ICD-10-CM | POA: Diagnosis not present

## 2017-08-07 DIAGNOSIS — D751 Secondary polycythemia: Secondary | ICD-10-CM | POA: Diagnosis not present

## 2017-08-07 DIAGNOSIS — S43002A Unspecified subluxation of left shoulder joint, initial encounter: Secondary | ICD-10-CM | POA: Diagnosis not present

## 2017-08-07 DIAGNOSIS — Z5181 Encounter for therapeutic drug level monitoring: Secondary | ICD-10-CM

## 2017-08-07 DIAGNOSIS — I69351 Hemiplegia and hemiparesis following cerebral infarction affecting right dominant side: Secondary | ICD-10-CM | POA: Diagnosis not present

## 2017-08-07 DIAGNOSIS — F418 Other specified anxiety disorders: Secondary | ICD-10-CM | POA: Insufficient documentation

## 2017-08-07 DIAGNOSIS — Z86718 Personal history of other venous thrombosis and embolism: Secondary | ICD-10-CM | POA: Insufficient documentation

## 2017-08-07 DIAGNOSIS — Z7901 Long term (current) use of anticoagulants: Secondary | ICD-10-CM | POA: Insufficient documentation

## 2017-08-07 DIAGNOSIS — M13862 Other specified arthritis, left knee: Secondary | ICD-10-CM | POA: Diagnosis not present

## 2017-08-07 DIAGNOSIS — M5416 Radiculopathy, lumbar region: Secondary | ICD-10-CM

## 2017-08-07 DIAGNOSIS — G8929 Other chronic pain: Secondary | ICD-10-CM | POA: Insufficient documentation

## 2017-08-07 LAB — BASIC METABOLIC PANEL
BUN/Creatinine Ratio: 13 (ref 9–20)
BUN: 17 mg/dL (ref 6–24)
CO2: 26 mmol/L (ref 20–29)
CREATININE: 1.35 mg/dL — AB (ref 0.76–1.27)
Calcium: 9.1 mg/dL (ref 8.7–10.2)
Chloride: 97 mmol/L (ref 96–106)
GFR, EST AFRICAN AMERICAN: 70 mL/min/{1.73_m2} (ref >59)
GFR, EST NON AFRICAN AMERICAN: 60 mL/min/{1.73_m2} (ref >59)
Glucose: 91 mg/dL (ref 65–99)
POTASSIUM: 4.3 mmol/L (ref 3.5–5.2)
SODIUM: 139 mmol/L (ref 134–144)

## 2017-08-07 LAB — HEPATIC FUNCTION PANEL
ALBUMIN: 4.4 g/dL (ref 3.5–5.5)
ALT: 21 IU/L (ref 0–44)
AST: 21 IU/L (ref 0–40)
Alkaline Phosphatase: 233 IU/L — ABNORMAL HIGH (ref 39–117)
BILIRUBIN TOTAL: 0.3 mg/dL (ref 0.0–1.2)
Bilirubin, Direct: 0.1 mg/dL (ref 0.00–0.40)
Total Protein: 6.6 g/dL (ref 6.0–8.5)

## 2017-08-07 NOTE — Patient Instructions (Signed)

## 2017-08-07 NOTE — Progress Notes (Signed)
Lumbar epidural steroid injection under fluoroscopic guidance  Indication: Lumbosacral radiculitis is not relieved by medication management or other conservative care and interfering with self-care and mobility.  No  anticoagulant use.  Informed consent was obtained after describing risk and benefits of the procedure with the patient, this includes bleeding, bruising, infection, paralysis and medication side effects.  The patient wishes to proceed and has given written consent.  Patient was placed in a prone position.  The lumbar area was marked and prepped with Betadine.  It was entered with a 25-gauge 1-1/2 inch needle and one mL of 1% lidocaine was injected into the skin and subcutaneous tissue.  Then a 17-gauge spinal needle was inserted under fluoroscopic guidance into the Left L5-S1 interlaminar space under AP and Lateral imaging.  Once needle tip of approximated the posterior elements, a loss of resistance technique was utilized with lateral imaging.  A positive loss of resistance was obtained and then confirmed by injecting 2 mL's of Omnipaque 180.  Then a solution containing 1.5 mL's of 21m/ml Celestone and 1.5 mL's of 1% lidocaine was injected.  The patient tolerated procedure well.  Post procedure instructions were given.  Please see post procedure form.

## 2017-08-07 NOTE — Progress Notes (Addendum)
  PROCEDURE RECORD [facility]   Name: Jesse Bowers DOB:21-Jan-1966 MRN: [id number]  Date:08/07/2017  Physician: Alysia Penna, MD    Nurse/CMA Truman Hayward CMA  Allergies:  Allergies  Allergen Reactions  . Penicillin G Anaphylaxis and Other (See Comments)    Has patient had a PCN reaction causing immediate rash, facial/tongue/throat swelling, SOB or lightheadedness with hypotension: Yes Has patient had a PCN reaction causing severe rash involving mucus membranes or skin necrosis: No Has patient had a PCN reaction that required hospitalization Yes Has patient had a PCN reaction occurring within the last 10 years: No If all of the above answers are "NO", then may proceed with Cephalosporin use.   . Vancomycin Rash and Other (See Comments)    RED MAN SYNDROME CAN HAVE IF GIVEN OVER 2HOURS  . Duloxetine Other (See Comments)    Restless legs  . Gabapentin Nausea Only and Other (See Comments)    Other reaction(s): nausea, mental status  . Tegaderm Ag Mesh [Silver] Other (See Comments)    Causes blistering wounds   . Acetaminophen Other (See Comments)    Elevates liver enzymes.   . Ibuprofen Other (See Comments)    Contraindicated with Xarelto.   . Nortriptyline Other (See Comments)    Dry mouth at 25 mg dose.  Tolerates 10 mg dose  . Pregabalin Other (See Comments)    Ineffective  . Sulfa Antibiotics Rash and Other (See Comments)    Stevens-Johnson rash  . Sulfacetamide Sodium-Sulfur Rash    Consent Signed: Yes.    Is patient diabetic? No.  CBG today?  Pregnant: No. LMP: No LMP recorded. (age 70-55)  Anticoagulants: stopped Xarelto 08/02/17 Anti-inflammatory: no Antibiotics: no  ProcedureTransforaminal Epidural Position: Prone Start Time: 10:39am End Time: 10:47am Fluoro Time: 27  RN/CMA Shavone Nevers RN Lee,CMA    Time 1000 10:50am    BP 144/88 146/85    Pulse 98 100    Respirations 14 14    O2 Sat 95 96    S/S 6 6    Pain Level 6/10  7/10     D/C home with wife , patient A & O X 3, D/C instructions reviewed, and sits independently.

## 2017-08-11 ENCOUNTER — Ambulatory Visit (INDEPENDENT_AMBULATORY_CARE_PROVIDER_SITE_OTHER): Payer: BLUE CROSS/BLUE SHIELD | Admitting: Psychology

## 2017-08-11 DIAGNOSIS — F4312 Post-traumatic stress disorder, chronic: Secondary | ICD-10-CM

## 2017-08-13 ENCOUNTER — Ambulatory Visit: Payer: BLUE CROSS/BLUE SHIELD | Admitting: Psychology

## 2017-08-13 LAB — 6-ACETYLMORPHINE,TOXASSURE ADD
6-ACETYLMORPHINE: NEGATIVE
6-acetylmorphine: NOT DETECTED ng/mg creat

## 2017-08-13 LAB — TOXASSURE SELECT,+ANTIDEPR,UR

## 2017-08-17 ENCOUNTER — Telehealth: Payer: Self-pay | Admitting: *Deleted

## 2017-08-17 NOTE — Telephone Encounter (Signed)
Urine drug screen for this encounter is consistent for prescribed medication 

## 2017-08-18 ENCOUNTER — Ambulatory Visit (INDEPENDENT_AMBULATORY_CARE_PROVIDER_SITE_OTHER): Payer: BLUE CROSS/BLUE SHIELD | Admitting: Psychology

## 2017-08-18 DIAGNOSIS — F4312 Post-traumatic stress disorder, chronic: Secondary | ICD-10-CM | POA: Diagnosis not present

## 2017-08-19 ENCOUNTER — Encounter: Payer: Self-pay | Admitting: Physical Medicine & Rehabilitation

## 2017-08-19 ENCOUNTER — Ambulatory Visit: Payer: BLUE CROSS/BLUE SHIELD | Admitting: Family Medicine

## 2017-08-19 ENCOUNTER — Encounter
Payer: BLUE CROSS/BLUE SHIELD | Attending: Physical Medicine & Rehabilitation | Admitting: Physical Medicine & Rehabilitation

## 2017-08-19 VITALS — BP 125/81 | HR 83 | Resp 14 | Ht 68.0 in | Wt 232.0 lb

## 2017-08-19 DIAGNOSIS — G894 Chronic pain syndrome: Secondary | ICD-10-CM

## 2017-08-19 DIAGNOSIS — M5416 Radiculopathy, lumbar region: Secondary | ICD-10-CM

## 2017-08-19 DIAGNOSIS — G459 Transient cerebral ischemic attack, unspecified: Secondary | ICD-10-CM | POA: Diagnosis not present

## 2017-08-19 DIAGNOSIS — L4050 Arthropathic psoriasis, unspecified: Secondary | ICD-10-CM | POA: Diagnosis not present

## 2017-08-19 MED ORDER — MORPHINE SULFATE ER 60 MG PO CP24: 60.0000 mg | ORAL_CAPSULE | Freq: Two times a day (BID) | ORAL | 0 refills | Status: DC

## 2017-08-19 MED ORDER — OXYCODONE HCL 10 MG PO TABS: 10.0000 mg | ORAL_TABLET | Freq: Three times a day (TID) | ORAL | 0 refills | Status: DC | PRN

## 2017-08-19 NOTE — Patient Instructions (Signed)
PLEASE FEEL FREE TO CALL OUR OFFICE WITH ANY PROBLEMS OR QUESTIONS ([phone])      

## 2017-08-19 NOTE — Progress Notes (Signed)
Subjective:    Patient ID: Jesse Bowers, adult    DOB: [birth year], 52 y.o.   MRN: [id number]  HPI   Nichalos is here in follow up of his gait disorder and chronic pain. He's had good results with the interlaminar ESI's the last of which was a left L5-S1 translaminar on 6/7. (the previous on 3/29). Back pain has decreased by 50% or more.   He had left TKA revision earlier this year which has improved his pain and flexibility although he still has some pain in his lateral left knee. He remains limited with mobility and is using a powered w/c sometimes throughout the day in the home and certainly outside the house  From a neurological standpoint he's been fairly stable. He's had days where he feels weaker on the left side and that his vision is more blurry or speech is dysarthric. We have reviewed his most recent cervical MRI which is stable from a standpoint of the syrinx.  He remains on kadian and oxycodone for pain control which continue to work for him.        Pain Inventory Average Pain 7 Pain Right Now 8 My pain is constant  In the last 24 hours, has pain interfered with the following? General activity 0 Relation with others 0 Enjoyment of life 0 What TIME of day is your pain at its worst? daytime, night Sleep (in general) Poor  Pain is worse with: walking, bending and standing Pain improves with: medication Relief from Meds: 8  Mobility use a wheelchair  Function disabled: date disabled .  Neuro/Psych bladder control problems weakness numbness tremor trouble walking spasms confusion anxiety  Prior Studies Any changes since last visit?  no  Physicians involved in your care Any changes since last visit?  no   Family History  Problem Relation Age of Onset  . Stroke Maternal Grandfather        38  . Heart attack Maternal Grandfather   . Hypertension Mother   . Psoriasis Mother   . Cancer Paternal Grandfather   . Heart attack Paternal Grandfather   .  Stroke Paternal Uncle        age 22  . Stroke Maternal Grandmother   . Congestive Heart Failure Maternal Grandmother   . Heart attack Maternal Grandmother   . Protein C deficiency Sister 16       Miscarriages   Social History   Socioeconomic History  . Marital status: Married    Spouse name: Not on file  . Number of children: 2  . Years of education: 4y college  . Highest education level: Not on file  Occupational History  . Occupation: Pediatric Nurse practitioner    Comment: Not working since [place] 2015  Social Needs  . Financial resource strain: Not on file  . Food insecurity:    Worry: Not on file    Inability: Not on file  . Transportation needs:    Medical: Not on file    Non-medical: Not on file  Tobacco Use  . Smoking status: Never Smoker  . Smokeless tobacco: Never Used  Substance and Sexual Activity  . Alcohol use: No    Frequency: Never  . Drug use: No  . Sexual activity: Yes    Birth control/protection: None    Comment: patient is a transgender on testosterone shots, no biological kids  Lifestyle  . Physical activity:    Days per week: Not on file    Minutes per session: Not on  file  . Stress: Not on file  Relationships  . Social connections:    Talks on phone: Not on file    Gets together: Not on file    Attends religious service: Not on file    Active member of club or organization: Not on file    Attends meetings of clubs or organizations: Not on file    Relationship status: Not on file  Other Topics Concern  . 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 Relocated to [place] about 2 months ago from [place], [place]. Patient was in [place] for last 4 years and prior to that in [place]. His wife is working as Scientist, research (physical sciences) for [employer]. Patient is not working and applying for disability. They have 2 kids but no biologic  children.    Past Surgical History:  Procedure Laterality Date  . ABDOMINAL HYSTERECTOMY     TAH, BSO- tranverse incision.  [name] ANKLE ARTHROSCOPY WITH RECONSTRUCTION Right 2007  . CHOLECYSTECTOMY     laparoscopic  . HIP ARTHROSCOPY W/ LABRAL REPAIR Right 05/11/2013  . KNEE SURGERY Bilateral 1984   Right ACL, left PCL repair  . LIVER BIOPSY  2013   normal results.  [name] NASAL SEPTUM SURGERY N/A 09/20/2015  . THYROIDECTOMY, PARTIAL  2008  . TOTAL KNEE REVISION Left 02/06/2016   Procedure: LEFT TOTAL KNEE REVISION;  Surgeon: [name], MD;  Location: [facility];  Service: Orthopedics;  Laterality: Left;  . TOTAL KNEE REVISION Left 04/22/2017   Procedure: Left knee polyethylene revision;  Surgeon: [name], MD;  Location: [facility];  Service: Orthopedics;  Laterality: Left;   Past Medical History:  Diagnosis Date  . Abnormal weight loss   . Anxiety    PTSD per patient  . Celiac disease   . Cervical neck pain with evidence of disc disease    patient has a cyst   . Degenerative disc disease at L5-S1 level   . Family history of adverse reaction to anesthesia    family has problems with anesthesia of nausea and vomiting   . Gender bias    prefers Male gender identity- "remains with vagina"  . Gluten enteropathy   . H/O parotitis    right   . H/O protein C deficiency    Dr. Anne Fu  . History of kidney stones   . Hx-TIA (transient ischemic attack)    2015  . Neck pain   . Neuromuscular disorder ([place])    bilateral neuropathy feet.  . Polycythemia, secondary   . PONV (postoperative nausea and vomiting)   . Psoriasis   . psoriatic arthritis   . Sleep apnea    cpap use- Dr. [name] follows  . Stroke [facility])    Stroke left side -slight right sided weakness-Dr. [name] follows  . Syrinx of spinal cord ([place]) 01/06/2014   c spine on MRI  . Tachycardia    hx of   . Transfusion history    past history- none recent  . Transgendered    BP 125/81   Pulse 83   Resp 14   Ht 5' 8"   (1.727 m)   Wt 232 lb (105.2 kg)   SpO2 95%   BMI 35.28 kg/m   Opioid Risk Score:   Fall Risk Score:  `1  Depression screen PHQ 2/9  Depression screen [facility] 2/9 08/07/2017 06/24/2017 02/18/2017 11/10/2016  09/15/2016  Decreased Interest 3 0 1 0 0  Down, Depressed, Hopeless 3 0 1 0 0  PHQ - 2 Score 6 0 2 0 0  Altered sleeping - 0 - - -  Tired, decreased energy - 0 - - -  Change in appetite - 0 - - -  Feeling bad or failure about yourself  - 0 - - -  Trouble concentrating - 0 - - -  Moving slowly or fidgety/restless - 0 - - -  Suicidal thoughts - 0 - - -  PHQ-9 Score - 0 - - -  Some recent data might be hidden    Review of Systems  HENT: Negative.   Cardiovascular: Negative.   Gastrointestinal: Negative.   Endocrine: Negative.   Genitourinary: Negative.   Musculoskeletal: Positive for arthralgias, back pain, gait problem and neck pain.       Spasms   Skin: Negative.   Neurological: Positive for tremors, weakness and numbness.  Psychiatric/Behavioral: The patient is nervous/anxious.   All other systems reviewed and are negative.      Objective:   Physical Exam  General: No acute distress HEENT: EOMI, oral membranes moist Cards: reg rate  Chest: normal effort Abdomen: Soft, NT, ND Skin: dry, intact Extremities: no edema  Skin:Clean and intact without signs of breakdown. Left TKA scar.  Neuro:Pt is pleasant and alert and oriented x 3. Minimalright facial weakness and sensory loss is appreciated. UE motor: 4-  To 4/5 LUE  . RUE: 4- to 4+deltoid, bicep, tricep, wrist and grip as well. RLE: 3+ to 4-/5hf, ke and ankle df. LLE: 3+to 4-/5 prox to /5 distal . Reflexes remain hyperactive.   Sensory loss notable with some diminishment and see 6 and 7 however, both left and right upper extremities seem somewhat involved.  Exam is inconsistent. Decreased sensation over the chest wall and lower extremities also to light touch and pain.  Low back remains tender to palpation.      Musculoskeletal:Rotator cuff or bicipital tendon integrity today.  tka scars Psych:Pt's affect is appropriate. Pt is cooperative. Is distracted at times   Assessment & Plan:  1. Psoriatic arthritis with pain in multiple areas, most prominently feet, hands, elbows.Hispain is also related to his prior CVA and associated motor/sensory change patient's complaining of increased s/spasticity. 2. 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  3. Hx of left total knee revisionagain on 04/21/17 per [facility] 4. Chronic low back pain---MRI with severe DDD at L5-S1. Left S1 radiculopathy? S/p LEFT L5-S1 ESI 5. Protein C deficiency  6. Left shoulder subluxation, bicipital tendonitis  7. Central sleep apnea  8. Polycythemia  9. Depression with anxiety. 10. Left lateral epidondylitis 11. 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  left sided weakness can fluctuate. Recent cervical MRI (07/08/17) without significant change  Plan:  1.Pt has not had any recent left upper extremity weakness but his symptoms can fluctuate.  Given that his recent MRI of the cervical spine was without change it is possible that some of the symptoms may be more related to his cerebral disease.  He has not followed up with a neurologist for years as well.  Given these factors, I think it would be prudent to see neurology to assess him for these intermittent TIA-like symptoms.  His most recent MRI of the brain shows chronic microvascular disease in the periventricular and subcortical white matter.  I  will make a referral to Dr. Erlinda Hong for further assessment and necessary work-up. 2.MaintainHEPas possible 3.Voltaren gel to hands, feet, knees, elbows 4.Baclofen for lower extermity spasms/back pain. 20 mg 4 times daily #120.  I discussed with him that the increase in baclofen could increase the perception of  weakness although this would not cause sensory changes. 5. Refilled kadian 43m q12 #60and oxycodone 192m#90today. We will continue the controlled substance monitoring program, this consists of regular clinic visits, examinations, routine drug screening, pill counts as well [place].  Second rx'es were provided for next month 6.maintain pamelor 1053mhs for sleep and neuropathic pain 7.Good results with left L5-S1 translaminar injection per Dr. KirAntonietta JewelP as possible      8m66mes of face to face patient care time were spent during this visit.  follow-up with NP in about 2 month's time.All questions were encouraged and answered.  Greater than 50% of time during this encounter was spent counseling patient/family in regard to prior scans, discussing his neurological presentation and reviewing a plan for treatment.

## 2017-08-20 ENCOUNTER — Ambulatory Visit: Payer: BLUE CROSS/BLUE SHIELD | Admitting: Psychology

## 2017-08-22 ENCOUNTER — Encounter: Payer: Self-pay | Admitting: Physician Assistant

## 2017-08-25 ENCOUNTER — Ambulatory Visit (INDEPENDENT_AMBULATORY_CARE_PROVIDER_SITE_OTHER): Payer: BLUE CROSS/BLUE SHIELD | Admitting: Psychology

## 2017-08-25 DIAGNOSIS — F4312 Post-traumatic stress disorder, chronic: Secondary | ICD-10-CM

## 2017-08-27 ENCOUNTER — Ambulatory Visit: Payer: BLUE CROSS/BLUE SHIELD | Admitting: Psychology

## 2017-08-28 ENCOUNTER — Ambulatory Visit (HOSPITAL_COMMUNITY)
Admission: EM | Admit: 2017-08-28 | Discharge: 2017-08-28 | Disposition: A | Discharge status: Home / Self Care | Payer: BLUE CROSS/BLUE SHIELD | Attending: Family Medicine | Admitting: Family Medicine

## 2017-08-28 ENCOUNTER — Ambulatory Visit (INDEPENDENT_AMBULATORY_CARE_PROVIDER_SITE_OTHER): Payer: BLUE CROSS/BLUE SHIELD

## 2017-08-28 ENCOUNTER — Encounter (HOSPITAL_COMMUNITY): Payer: Self-pay | Admitting: Emergency Medicine

## 2017-08-28 DIAGNOSIS — M79644 Pain in right finger(s): Secondary | ICD-10-CM

## 2017-08-28 DIAGNOSIS — M25531 Pain in right wrist: Secondary | ICD-10-CM

## 2017-08-28 NOTE — Discharge Instructions (Addendum)
Thumb spica brace given Continue conservative management of rest, ice, and gentle stretches Use OTC medications as needed for symptomatic relief of pain Follow up with PCP or orthopedist if symptoms persist Return or go to the ER if you have any new or worsening symptoms (fever, chills, chest pain, abdominal pain, changes in bowel or bladder habits, pain radiating into lower legs, etc...)

## 2017-08-28 NOTE — ED Triage Notes (Signed)
Pt states he was playing thumb war with his daughter and felt a pop in his R hand. C/o R hand pain.

## 2017-08-28 NOTE — ED Provider Notes (Signed)
[place]   [id number] 08/28/17 Arrival Time: 1821  SUBJECTIVE: History from: patient. Jesse Bowers hx of TIA is a 52 y.o. adult complains of right thumb pain that began today.  It occurred while playing thumb war with his daughter.  States he heard a pop.  Localizes the pain to the base of the thumb towards the wrist.  Describes the pain as constant.  Has not tried OTC medications.  Symptoms are made worse with thumb ROM.  Report previous thumb fracture many years ago while playing hockey. Complains of swelling.   Denies fever, chills, erythema, ecchymosis, weakness, numbness and tingling.      ROS: As per HPI.  Past Medical History:  Diagnosis Date  . Abnormal weight loss   . Anxiety    PTSD per patient  . Celiac disease   . Cervical neck pain with evidence of disc disease    patient has a cyst   . Degenerative disc disease at L5-S1 level   . Family history of adverse reaction to anesthesia    family has problems with anesthesia of nausea and vomiting   . Gender bias    prefers Male gender identity- "remains with vagina"  . Gluten enteropathy   . H/O parotitis    right   . H/O protein C deficiency    Dr. Anne Fu  . History of kidney stones   . Hx-TIA (transient ischemic attack)    2015  . Neck pain   . Neuromuscular disorder ([place])    bilateral neuropathy feet.  . Polycythemia, secondary   . PONV (postoperative nausea and vomiting)   . Psoriasis   . psoriatic arthritis   . Sleep apnea    cpap use- Dr. [name] follows  . Stroke [facility])    Stroke left side -slight right sided weakness-Dr. [name] follows  . Syrinx of spinal cord ([place]) 01/06/2014   c spine on MRI  . Tachycardia    hx of   . Transfusion history    past history- none recent  . Transgendered    Past Surgical History:  Procedure Laterality Date  . ABDOMINAL HYSTERECTOMY     TAH, BSO- tranverse incision.  [name] ANKLE ARTHROSCOPY WITH RECONSTRUCTION Right 2007  . CHOLECYSTECTOMY     laparoscopic  . HIP ARTHROSCOPY W/ LABRAL REPAIR Right 05/11/2013  . KNEE SURGERY Bilateral 1984   Right ACL, left PCL repair  . LIVER BIOPSY  2013   normal results.  [name] NASAL SEPTUM SURGERY N/A 09/20/2015  . THYROIDECTOMY, PARTIAL  2008  . TOTAL KNEE REVISION Left 02/06/2016   Procedure: LEFT TOTAL KNEE REVISION;  Surgeon: [name], MD;  Location[facility];  Service: Orthopedics;  Laterality: Left;  . TOTAL KNEE REVISION Left 04/22/2017   Procedure: Left knee polyethylene revision;  Surgeon: [name], MD;  Location: [facility];  Service: Orthopedics;  Laterality: Left;   Allergies  Allergen Reactions  . Penicillin G Anaphylaxis and Other (See Comments)    Has patient had a PCN reaction causing immediate rash, facial/tongue/throat swelling, SOB or lightheadedness with hypotension: Yes Has patient had a PCN reaction causing severe rash involving mucus membranes or skin necrosis: No Has patient had a PCN reaction that required hospitalization Yes Has patient had a PCN reaction occurring within the last 10 years: No If all of the above answers are "NO", then may proceed with Cephalosporin use.   . Vancomycin Rash and Other (See Comments)    RED MAN SYNDROME CAN HAVE IF GIVEN  OVER 2HOURS  . Duloxetine Other (See Comments)    Restless legs  . Gabapentin Nausea Only and Other (See Comments)    Other reaction(s): nausea, mental status  . Tegaderm Ag Mesh [Silver] Other (See Comments)    Causes blistering wounds   . Acetaminophen Other (See Comments)    Elevates liver enzymes.   . Ibuprofen Other (See Comments)    Contraindicated with Xarelto.   . Nortriptyline Other (See Comments)    Dry mouth at 25 mg dose.  Tolerates 10 mg dose  . Pregabalin Other (See Comments)    Ineffective  . Sulfa Antibiotics Rash and Other (See Comments)    Stevens-Johnson rash  . Sulfacetamide Sodium-Sulfur Rash   No current facility-administered medications on file prior to encounter.    Current  Outpatient Medications on File Prior to Encounter  Medication Sig Dispense Refill  . Secukinumab (COSENTYX [place]) Inject into the skin.    . baclofen (LIORESAL) 20 MG tablet Take 1 tablet (20 mg total) by mouth 4 (four) times daily. 120 tablet 3  . betamethasone dipropionate (DIPROLENE) 0.05 % cream Apply topically 2 (two) times daily.    . Calcipotriene-Betameth Diprop (ENSTILAR) 0.005-0.064 % FOAM Apply 1 application topically 2 (two) times daily.     . clorazepate (TRANXENE) 15 MG tablet Take 15 mg by mouth at bedtime.    . clorazepate (TRANXENE) 7.5 MG tablet Take 7.5 mg by mouth 2 (two) times daily.     [name] desonide (DESOWEN) 0.05 % ointment APPLY TO AFFECTED AREAS TWICE DAILY AS NEEDED FOR PSORIASIS.  2  . fluvoxaMINE (LUVOX) 25 MG tablet TAKE 1 TABLET DAILY FOR 1 WEEK THEN TAKE 1 TABLET TWICE DAILY  2  . furosemide (LASIX) 40 MG tablet Take 1 tablet (40 mg total) by mouth daily. Take 1 extra tab daily prn for a weight gain of 3lbs in a day or 5 lbs per week. 36 tablet 6  . morphine (KADIAN) 60 MG 24 hr capsule Take 1 capsule (60 mg total) by mouth every 12 (twelve) hours. 60 capsule 0  . neomycin-polymyxin-hydrocortisone (CORTISPORIN) 3.5-10000-1 OTIC suspension PLACE 4 DROPS INTO LEFT EAR TWICE A DAY AS NEEDED FOR PAIN AND DRAINAGE  1  . neomycin-polymyxin-hydrocortisone (CORTISPORIN) OTIC solution 4 (four) times daily.    . NONFORMULARY OR COMPOUNDED ITEM Apply 1 application topically 3 (three) times daily. 8% ketamine, 5% amitriptyline, 5% baclofen, 5% gabapentin  60 GM (Patient taking differently: Apply 1 application topically 3 (three) times daily as needed (pain). 8% ketamine, 5% amitriptyline, 5% baclofen, 5% gabapentin  60 GM) 1 each 5  . nortriptyline (PAMELOR) 10 MG capsule Take 1 capsule (10 mg total) by mouth at bedtime. 30 capsule 1  . ondansetron (ZOFRAN) 4 MG tablet Take 1 tablet (4 mg total) by mouth every 8 (eight) hours as needed for nausea or vomiting. 30 tablet 0  .  Oxycodone HCl 10 MG TABS Take 1 tablet (10 mg total) by mouth every 8 (eight) hours as needed ((score 7 to 10)). Do Not Fill Before 09/16/17 90 tablet 0  . potassium chloride 20 MEQ TBCR Take 20 mEq by mouth daily. If you take > 1 extra dose of Lasix in a week, take extra 20 meq Kdur 36 tablet 6  . prazosin (MINIPRESS) 1 MG capsule Take 1 mg by mouth at bedtime. Takes 1 mg and 2 mg to equal 3 mg at bedtime    . prazosin (MINIPRESS) 2 MG capsule Take 2 mg by mouth  at bedtime. Takes 1 mg and 2 mg to equal 3 mg at bedtime    . rivaroxaban (XARELTO) 20 MG TABS tablet TAKE 1 TABLET DAILY BEFORE BEDTIME 30 tablet 6  . testosterone cypionate (DEPO-TESTOSTERONE) 200 MG/ML injection Inject 0.25 mLs (50 mg total) into the muscle once a week. (Patient taking differently: Inject 100 mg into the muscle once a week. Friday) 10 mL 0  . ustekinumab (STELARA) 90 MG/ML SOSY injection Inject 90 mg into the skin every 3 (three) months. Last dose March 07, 2017    . venlafaxine XR (EFFEXOR-XR) 150 MG 24 hr capsule Take 300 mg by mouth daily with breakfast.     Social History   Socioeconomic History  . Marital status: Married    Spouse name: Not on file  . Number of children: 2  . Years of education: 4y college  . Highest education level: Not on file  Occupational History  . Occupation: Pediatric Nurse practitioner    Comment: Not working since [place] 2015  Social Needs  . Financial resource strain: Not on file  . Food insecurity:    Worry: Not on file    Inability: Not on file  . Transportation needs:    Medical: Not on file    Non-medical: Not on file  Tobacco Use  . Smoking status: Never Smoker  . Smokeless tobacco: Never Used  Substance and Sexual Activity  . Alcohol use: No    Frequency: Never  . Drug use: No  . Sexual activity: Yes    Birth control/protection: None    Comment: patient is a transgender on testosterone shots, no biological kids  Lifestyle  . Physical activity:    Days per week:  Not on file    Minutes per session: Not on file  . Stress: Not on file  Relationships  . Social connections:    Talks on phone: Not on file    Gets together: Not on file    Attends religious service: Not on file    Active member of club or organization: Not on file    Attends meetings of clubs or organizations: Not on file    Relationship status: Not on file  . Intimate partner violence:    Fear of current or ex partner: Not on file    Emotionally abused: Not on file    Physically abused: Not on file    Forced sexual activity: Not on file  Other Topics Concern  . 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 Relocated to [place] about 2 months ago from [place], [place]. Patient was in [place] for last 4 years and prior to that in [place]. His wife is working as Scientist, research (physical sciences) for [employer]. Patient is not working and applying for disability. They have 2 kids but no biologic children.    Family History  Problem Relation Age of Onset  . Stroke Maternal Grandfather        12  . Heart attack Maternal Grandfather   . Hypertension Mother   . Psoriasis Mother   . Cancer Paternal Grandfather   . Heart attack Paternal Grandfather   . Stroke Paternal Uncle        age 36  . Stroke Maternal Grandmother   . Congestive Heart Failure Maternal Grandmother   . Heart attack Maternal Grandmother   . Protein C deficiency Sister  [place]:  Vitals:   08/28/17 1839  BP: 131/88  Pulse: 86  Resp: 18  Temp: 97.6 F (36.4 C)  SpO2: 100%    General appearance: AOx3; in no acute distress.  Head: NCAT Lungs: CTA bilaterally Heart: RRR.  Clear S1 and S2 without murmur, gallops, or rubs.  Radial pulses 2+ bilaterally. Musculoskeletal: Right thumb Inspection: Skin warm, dry, clear and intact without obvious erythema, , or ecchymosis. Mild  effusion Palpation: Tender to palpation about the 1st finger IP joint, MCP joint, and snuff box; nontender over distal radius ROM: LROM about the thumb; wiggles fingers and pronates and supinates without difficulty Strength: 4/5 grip strength Skin: warm and dry Neurologic: motorized wheelchair Psychological: alert and cooperative; normal mood and affect  ASSESSMENT & PLAN:  1. Thumb pain, right   2. Right wrist pain     No orders of the defined types were placed in this encounter.  Thumb spica brace given Continue conservative management of rest, ice, and gentle stretches Use OTC medications as needed for symptomatic relief of pain Follow up with PCP or orthopedist if symptoms persist Return or go to the ER if you have any new or worsening symptoms (fever, chills, chest pain, abdominal pain, changes in bowel or bladder habits, pain radiating into lower legs, etc...)   Reviewed expectations re: course of current medical issues. Questions answered. Outlined signs and symptoms indicating need for more acute intervention. Patient verbalized understanding. After Visit Summary given.    Lestine Box, PA-C 08/28/17 1942

## 2017-08-31 ENCOUNTER — Ambulatory Visit: Payer: Self-pay | Admitting: Registered Nurse

## 2017-09-01 ENCOUNTER — Ambulatory Visit (INDEPENDENT_AMBULATORY_CARE_PROVIDER_SITE_OTHER): Payer: BLUE CROSS/BLUE SHIELD | Admitting: Psychology

## 2017-09-01 DIAGNOSIS — F4312 Post-traumatic stress disorder, chronic: Secondary | ICD-10-CM | POA: Diagnosis not present

## 2017-09-04 ENCOUNTER — Encounter: Payer: Self-pay | Admitting: Physical Medicine & Rehabilitation

## 2017-09-04 ENCOUNTER — Ambulatory Visit: Payer: BLUE CROSS/BLUE SHIELD | Admitting: Family Medicine

## 2017-09-04 ENCOUNTER — Encounter: Payer: Self-pay | Admitting: Family Medicine

## 2017-09-07 ENCOUNTER — Other Ambulatory Visit: Payer: Self-pay | Admitting: Physician Assistant

## 2017-09-07 ENCOUNTER — Encounter: Payer: Self-pay | Admitting: Hematology

## 2017-09-07 NOTE — Telephone Encounter (Signed)
Rx sent to pharmacy   

## 2017-09-08 ENCOUNTER — Ambulatory Visit (INDEPENDENT_AMBULATORY_CARE_PROVIDER_SITE_OTHER): Payer: BLUE CROSS/BLUE SHIELD | Admitting: Psychology

## 2017-09-08 DIAGNOSIS — F4312 Post-traumatic stress disorder, chronic: Secondary | ICD-10-CM | POA: Diagnosis not present

## 2017-09-09 ENCOUNTER — Other Ambulatory Visit: Payer: Self-pay

## 2017-09-09 DIAGNOSIS — I5032 Chronic diastolic (congestive) heart failure: Secondary | ICD-10-CM

## 2017-09-09 NOTE — Progress Notes (Signed)
Labs only per patient request.

## 2017-09-10 ENCOUNTER — Ambulatory Visit (INDEPENDENT_AMBULATORY_CARE_PROVIDER_SITE_OTHER): Payer: BLUE CROSS/BLUE SHIELD | Admitting: Psychology

## 2017-09-10 DIAGNOSIS — F4310 Post-traumatic stress disorder, unspecified: Secondary | ICD-10-CM

## 2017-09-10 LAB — CBC WITH DIFFERENTIAL/PLATELET
Basophils Absolute: 0 10*3/uL (ref 0.0–0.2)
Basos: 0 %
EOS (ABSOLUTE): 0 10*3/uL (ref 0.0–0.4)
EOS: 0 %
HEMATOCRIT: 42 % (ref 37.5–51.0)
Hemoglobin: 12.5 g/dL — ABNORMAL LOW (ref 13.0–17.7)
IMMATURE GRANULOCYTES: 0 %
Immature Grans (Abs): 0 10*3/uL (ref 0.0–0.1)
LYMPHS: 21 %
Lymphocytes Absolute: 0.8 10*3/uL (ref 0.7–3.1)
MCH: 22.5 pg — ABNORMAL LOW (ref 26.6–33.0)
MCHC: 29.8 g/dL — ABNORMAL LOW (ref 31.5–35.7)
MCV: 76 fL — ABNORMAL LOW (ref 79–97)
MONOS ABS: 0.3 10*3/uL (ref 0.1–0.9)
Monocytes: 8 %
NEUTROS PCT: 71 %
Neutrophils Absolute: 2.6 10*3/uL (ref 1.4–7.0)
PLATELETS: 248 10*3/uL (ref 150–450)
RBC: 5.56 x10E6/uL (ref 4.14–5.80)
RDW: 18.1 % — AB (ref 12.3–15.4)
WBC: 3.7 10*3/uL (ref 3.4–10.8)

## 2017-09-10 LAB — COMPREHENSIVE METABOLIC PANEL
ALT: 27 IU/L (ref 0–44)
AST: 24 IU/L (ref 0–40)
Albumin/Globulin Ratio: 2.2 (ref 1.2–2.2)
Albumin: 4.4 g/dL (ref 3.5–5.5)
Alkaline Phosphatase: 264 IU/L — ABNORMAL HIGH (ref 39–117)
BUN/Creatinine Ratio: 14 (ref 9–20)
BUN: 16 mg/dL (ref 6–24)
Bilirubin Total: 0.2 mg/dL (ref 0.0–1.2)
CO2: 27 mmol/L (ref 20–29)
CREATININE: 1.12 mg/dL (ref 0.76–1.27)
Calcium: 8.8 mg/dL (ref 8.7–10.2)
Chloride: 101 mmol/L (ref 96–106)
GFR calc Af Amer: 87 mL/min/{1.73_m2} (ref >59)
GFR calc non Af Amer: 76 mL/min/{1.73_m2} (ref >59)
GLOBULIN, TOTAL: 2 g/dL (ref 1.5–4.5)
Glucose: 112 mg/dL — ABNORMAL HIGH (ref 65–99)
Potassium: 4.4 mmol/L (ref 3.5–5.2)
SODIUM: 142 mmol/L (ref 134–144)
Total Protein: 6.4 g/dL (ref 6.0–8.5)

## 2017-09-14 ENCOUNTER — Encounter: Payer: BLUE CROSS/BLUE SHIELD | Attending: Physical Medicine & Rehabilitation | Admitting: Registered Nurse

## 2017-09-14 ENCOUNTER — Ambulatory Visit (INDEPENDENT_AMBULATORY_CARE_PROVIDER_SITE_OTHER): Payer: BLUE CROSS/BLUE SHIELD | Admitting: Cardiovascular Disease

## 2017-09-14 ENCOUNTER — Encounter: Payer: Self-pay | Admitting: Registered Nurse

## 2017-09-14 ENCOUNTER — Encounter: Payer: Self-pay | Admitting: Cardiovascular Disease

## 2017-09-14 VITALS — BP 130/88 | HR 85 | Resp 14 | Ht 68.0 in | Wt 229.0 lb

## 2017-09-14 VITALS — BP 120/80 | HR 79 | Ht 68.0 in | Wt 229.4 lb

## 2017-09-14 DIAGNOSIS — Z96652 Presence of left artificial knee joint: Secondary | ICD-10-CM | POA: Diagnosis not present

## 2017-09-14 DIAGNOSIS — R002 Palpitations: Secondary | ICD-10-CM | POA: Diagnosis not present

## 2017-09-14 DIAGNOSIS — D6859 Other primary thrombophilia: Secondary | ICD-10-CM

## 2017-09-14 DIAGNOSIS — I69351 Hemiplegia and hemiparesis following cerebral infarction affecting right dominant side: Secondary | ICD-10-CM | POA: Diagnosis not present

## 2017-09-14 DIAGNOSIS — T84018S Broken internal joint prosthesis, other site, sequela: Secondary | ICD-10-CM

## 2017-09-14 DIAGNOSIS — G894 Chronic pain syndrome: Secondary | ICD-10-CM

## 2017-09-14 DIAGNOSIS — G8929 Other chronic pain: Secondary | ICD-10-CM | POA: Insufficient documentation

## 2017-09-14 DIAGNOSIS — M542 Cervicalgia: Secondary | ICD-10-CM | POA: Diagnosis not present

## 2017-09-14 DIAGNOSIS — M545 Low back pain: Secondary | ICD-10-CM | POA: Insufficient documentation

## 2017-09-14 DIAGNOSIS — L4050 Arthropathic psoriasis, unspecified: Secondary | ICD-10-CM

## 2017-09-14 DIAGNOSIS — K900 Celiac disease: Secondary | ICD-10-CM | POA: Insufficient documentation

## 2017-09-14 DIAGNOSIS — Z7901 Long term (current) use of anticoagulants: Secondary | ICD-10-CM | POA: Diagnosis not present

## 2017-09-14 DIAGNOSIS — M13862 Other specified arthritis, left knee: Secondary | ICD-10-CM | POA: Insufficient documentation

## 2017-09-14 DIAGNOSIS — I5032 Chronic diastolic (congestive) heart failure: Secondary | ICD-10-CM | POA: Diagnosis not present

## 2017-09-14 DIAGNOSIS — F418 Other specified anxiety disorders: Secondary | ICD-10-CM | POA: Insufficient documentation

## 2017-09-14 DIAGNOSIS — M7062 Trochanteric bursitis, left hip: Secondary | ICD-10-CM | POA: Diagnosis not present

## 2017-09-14 DIAGNOSIS — Z9181 History of falling: Secondary | ICD-10-CM | POA: Insufficient documentation

## 2017-09-14 DIAGNOSIS — Z87890 Personal history of sex reassignment: Secondary | ICD-10-CM | POA: Diagnosis not present

## 2017-09-14 DIAGNOSIS — S43002A Unspecified subluxation of left shoulder joint, initial encounter: Secondary | ICD-10-CM | POA: Insufficient documentation

## 2017-09-14 DIAGNOSIS — Z79899 Other long term (current) drug therapy: Secondary | ICD-10-CM | POA: Insufficient documentation

## 2017-09-14 DIAGNOSIS — R252 Cramp and spasm: Secondary | ICD-10-CM | POA: Diagnosis not present

## 2017-09-14 DIAGNOSIS — M7522 Bicipital tendinitis, left shoulder: Secondary | ICD-10-CM | POA: Diagnosis not present

## 2017-09-14 DIAGNOSIS — M170 Bilateral primary osteoarthritis of knee: Secondary | ICD-10-CM | POA: Diagnosis not present

## 2017-09-14 DIAGNOSIS — Z5181 Encounter for therapeutic drug level monitoring: Secondary | ICD-10-CM

## 2017-09-14 DIAGNOSIS — D751 Secondary polycythemia: Secondary | ICD-10-CM | POA: Insufficient documentation

## 2017-09-14 DIAGNOSIS — R209 Unspecified disturbances of skin sensation: Secondary | ICD-10-CM | POA: Insufficient documentation

## 2017-09-14 DIAGNOSIS — Z86718 Personal history of other venous thrombosis and embolism: Secondary | ICD-10-CM | POA: Insufficient documentation

## 2017-09-14 DIAGNOSIS — Z79891 Long term (current) use of opiate analgesic: Secondary | ICD-10-CM | POA: Diagnosis not present

## 2017-09-14 DIAGNOSIS — I69320 Aphasia following cerebral infarction: Secondary | ICD-10-CM | POA: Diagnosis not present

## 2017-09-14 DIAGNOSIS — M5416 Radiculopathy, lumbar region: Secondary | ICD-10-CM

## 2017-09-14 DIAGNOSIS — G4733 Obstructive sleep apnea (adult) (pediatric): Secondary | ICD-10-CM

## 2017-09-14 DIAGNOSIS — I69398 Other sequelae of cerebral infarction: Secondary | ICD-10-CM | POA: Insufficient documentation

## 2017-09-14 DIAGNOSIS — Z96659 Presence of unspecified artificial knee joint: Secondary | ICD-10-CM | POA: Diagnosis not present

## 2017-09-14 MED ORDER — BACLOFEN 20 MG PO TABS: 20.0000 mg | ORAL_TABLET | Freq: Four times a day (QID) | ORAL | 3 refills | Status: DC

## 2017-09-14 MED ORDER — OXYCODONE HCL 10 MG PO TABS: 10.0000 mg | ORAL_TABLET | Freq: Three times a day (TID) | ORAL | 0 refills | Status: DC | PRN

## 2017-09-14 MED ORDER — MORPHINE SULFATE ER 60 MG PO CP24: 60.0000 mg | ORAL_CAPSULE | Freq: Two times a day (BID) | ORAL | 0 refills | Status: DC

## 2017-09-14 NOTE — Progress Notes (Signed)
Cardiology Office Note:    Date:  09/16/2017   ID:  Jesse Bowers, DOB 10/12/1965, MRN [id number]  PCP:  Concepcion Elk, MD  Cardiologist:  No primary care provider on file.   Referring MD: Concepcion Elk, MD   Chief Complaint  Patient presents with  . Follow-up    3 months  . Headache    History of Present Illness:    Jesse Bowers is a 52 y.o. adult with a hx of chronic diastolic heart failure, obstructive sleep apnea on CPAP, history of previous stroke on chronic anticoagulation with Xarelto (protein C deficiency) returning for routine follow-up. 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  "Dry weight" on his home scale seems to be around 231-234 pounds.  Our office scale today she is 229 pounds.  He is having a lot of problems with knee pain.  Only has mild residual right-sided weakness from his stroke and his activity is primarily limited by syrinx of the spinal cord is actually in a motorized scooter today.  He states that he is able to stand on a scale at home with minimal support against the wall.  Getting accurate weights.  He is taking furosemide every day and has to take an extra 40 mg tablet once every 2 or 3 weeks for fluid gain.  Overall volume status seems to be well compensated.  He denies orthopnea or PND.  It is very difficult to assess his functional status since he is very sedentary.  Denies angina, palpitations, dizziness lightheadedness or syncope.  Developed a lot of problems with muscle spasms when potassium level was low.  This has not been a problem recently.  Has not had any focal neurological complaints recently.  Now receiving Cosentyx for psoriasis.  Denies any serious bleeding problems from rivaroxaban.  On prazosin for night terrors.  Past Medical History:  Diagnosis Date  . Abnormal weight loss   . Anxiety    PTSD per patient  . Celiac disease   . Cervical neck pain with evidence of disc disease    patient has a cyst   . Degenerative disc  disease at L5-S1 level   . Family history of adverse reaction to anesthesia    family has problems with anesthesia of nausea and vomiting   . Gender bias    prefers Male gender identity- "remains with vagina"  . Gluten enteropathy   . H/O parotitis    right   . H/O protein C deficiency    Dr. Anne Fu  . History of kidney stones   . Hx-TIA (transient ischemic attack)    2015  . Neck pain   . Neuromuscular disorder ([place])    bilateral neuropathy feet.  . Polycythemia, secondary   . PONV (postoperative nausea and vomiting)   . Psoriasis   . psoriatic arthritis   . Sleep apnea    cpap use- Dr. [name] follows  . Stroke [facility])    Stroke left side -slight right sided weakness-Dr. [name] follows  . Syrinx of spinal cord ([place]) 01/06/2014   c spine on MRI  . Tachycardia    hx of   . Transfusion history    past history- none recent  . Transgendered     Past Surgical History:  Procedure Laterality Date  . ABDOMINAL HYSTERECTOMY     TAH, BSO- tranverse incision.  [name] ANKLE ARTHROSCOPY WITH RECONSTRUCTION Right 2007  . CHOLECYSTECTOMY     laparoscopic  . HIP ARTHROSCOPY W/ LABRAL REPAIR Right  05/11/2013  . KNEE SURGERY Bilateral 1984   Right ACL, left PCL repair  . LIVER BIOPSY  2013   normal results.  [name] NASAL SEPTUM SURGERY N/A 09/20/2015  . THYROIDECTOMY, PARTIAL  2008  . TOTAL KNEE REVISION Left 02/06/2016   Procedure: LEFT TOTAL KNEE REVISION;  Surgeon: [name], MD;  Location: [facility];  Service: Orthopedics;  Laterality: Left;  . TOTAL KNEE REVISION Left 04/22/2017   Procedure: Left knee polyethylene revision;  Surgeon: [name], MD;  Location: [facility];  Service: Orthopedics;  Laterality: Left;    Current Medications: No outpatient medications have been marked as taking for the 09/14/17 encounter (Office Visit) with Teyton Pattillo, Dani Gobble, MD.     Allergies:   Penicillin g; Vancomycin; Duloxetine; Gabapentin; Tegaderm ag mesh [silver]; Acetaminophen; Ibuprofen;  Nortriptyline; Pregabalin; Sulfa antibiotics; and Sulfacetamide sodium-sulfur   Social History   Socioeconomic History  . Marital status: Married    Spouse name: Not on file  . Number of children: 2  . Years of education: 4y college  . Highest education level: Not on file  Occupational History  . Occupation: Pediatric Nurse practitioner    Comment: Not working since [employer] 2015  Social Needs  . Financial resource strain: Not on file  . Food insecurity:    Worry: Not on file    Inability: Not on file  . Transportation needs:    Medical: Not on file    Non-medical: Not on file  Tobacco Use  . Smoking status: Never Smoker  . Smokeless tobacco: Never Used  Substance and Sexual Activity  . Alcohol use: No    Frequency: Never  . Drug use: No  . Sexual activity: Yes    Birth control/protection: None    Comment: patient is a transgender on testosterone shots, no biological kids  Lifestyle  . Physical activity:    Days per week: Not on file    Minutes per session: Not on file  . Stress: Not on file  Relationships  . Social connections:    Talks on phone: Not on file    Gets together: Not on file    Attends religious service: Not on file    Active member of club or organization: Not on file    Attends meetings of clubs or organizations: Not on file    Relationship status: Not on file  Other Topics Concern  . Not on file  Social History Narrative   Education 4 year college, former Therapist, sports X 15 years, pediatric nurse practitioner x 6 years, did NP degree from [employer] of [place]. Relocated to [place] about 2 months ago from [place], [place]. Patient was in [place] for last 4 years and prior to that in [place]. His wife is working as Scientist, research (physical sciences) for [employer]. Patient is not working and applying for disability. They have 2 kids but no biologic children.      Family History: The patient's family history includes Cancer in his paternal grandfather; Congestive Heart Failure in his  maternal grandmother; Heart attack in his maternal grandfather, maternal grandmother, and paternal grandfather; Hypertension in his mother; Protein C deficiency (age of onset: 61) in his sister; Psoriasis in his mother; Stroke in his maternal grandfather, maternal grandmother, and paternal uncle.  ROS:   Please see the history of present illness.     All other systems reviewed and are negative.  EKGs/Labs/Other Studies Reviewed:    EKG:  EKG is ordered today.  The ekg ordered today demonstrates normal sinus rhythm,  normal ECG there is very subtle delayed R wave progression across the anterior leads.  Recent Labs: 03/17/2017: Magnesium 2.2; TSH 4.980 09/09/2017: ALT 27; BUN 16; Creatinine, Ser 1.12; Hemoglobin 12.5; Platelets 248; Potassium 4.4; Sodium 142  Recent Lipid Panel No results found for: CHOL, TRIG, HDL, CHOLHDL, VLDL, LDLCALC, LDLDIRECT  Physical Exam:    VS:  BP 120/80 (BP Location: Left Arm, Patient Position: Sitting, Cuff Size: Large)   Pulse 79   Ht 5' 8"  (1.727 m)   Wt 229 lb 6.4 oz (104.1 kg)   BMI 34.88 kg/m     Wt Readings from Last 3 Encounters:  09/14/17 229 lb (103.9 kg)  09/14/17 229 lb 6.4 oz (104.1 kg)  08/19/17 232 lb (105.2 kg)     GEN:  Well nourished, well developed in no acute distress HEENT: Normal NECK: No JVD; No carotid bruits LYMPHATICS: No lymphadenopathy CARDIAC: RRR, no murmurs, rubs, gallops RESPIRATORY:  Clear to auscultation without rales, wheezing or rhonchi  ABDOMEN: Soft, non-tender, non-distended MUSCULOSKELETAL:  No edema; No deformity .  Lower extremity varicose veins. SKIN: Warm and dry NEUROLOGIC:  Alert and oriented x 3 PSYCHIATRIC:  Normal affect   ASSESSMENT:    1. Chronic diastolic heart failure (HCC)   2. Palpitations   3. OSA (obstructive sleep apnea)   4. Protein C deficiency ([place])    PLAN:    In order of problems listed above:  1. CHF: Appears clinically euvolemic, functional status and possible to assess  since he is so sedentary.  Current diuretic dosing appears to be working well.  Reviewed his echocardiogram from last October.  There was mild LVH and a mildly data left atrium with suggest that there is diastolic dysfunction and the tissue Doppler diastolic velocities were slightly decreased for his age, although there was no clear evidence of elevated filling pressures.  Suspect most of the edema is related to local venous insufficiency due to varicose veins, possibly some degree of right heart failure related to long-standing sleep apnea (but estimated systolic pulmonary artery pressure by echo was normal).  Reviewed the fact that his weight is a better overall marker for fluid gain and that leg edema might be simply related to redistribution when he sits in his scooter for hours at a time.  Getting a reliable weight is very important in managing his fluid status. 2. Palpitations: Has not been complaining of rapid palpitations recently.  It is possible these were anxiety related. 3. OSA: Reports compliance with CPAP 4. Protein C deficiency: Presumed cause of stroke.  On chronic anticoagulation.  Denies serious bleeding problems   Medication Adjustments/Labs and Tests Ordered: Current medicines are reviewed at length with the patient today.  Concerns regarding medicines are outlined above.  No orders of the defined types were placed in this encounter.  No orders of the defined types were placed in this encounter.   Patient Instructions  Dr [name] recommends that you schedule a follow-up appointment in 12 months. You will receive a reminder letter in the mail two months in advance. If you don't receive a letter, please call our office to schedule the follow-up appointment.  If you need a refill on your cardiac medications before your next appointment, please call your pharmacy.    Signed, Sanda Klein, MD  09/16/2017 3:40 PM    [place]

## 2017-09-14 NOTE — Progress Notes (Signed)
Subjective:    Patient ID: Jesse Bowers, adult    DOB: Jan 21, 1966, 52 y.o.   MRN: [id number]  HPI: [name] is a 52 year old male who returns for follow up appointment for chronic pin and medication refill. He states his pain is located in his neck, lower back, left hip and left knee. He rate his pain 8. His current exercise regime is walking.  [name] Morphine Equivalent is 165.00 MME. Last UDS was Performed on 08/07/2017, it was consistent.   Pain Inventory Average Pain 7 Pain Right Now 8 My pain is intermittent, constant, sharp and tingling  In the last 24 hours, has pain interfered with the following? General activity 8 Relation with others 10 Enjoyment of life 10 What TIME of day is your pain at its worst? all Sleep (in general) Poor  Pain is worse with: walking, sitting and standing Pain improves with: rest, medication and injections Relief from Meds: 5  Mobility walk with assistance use a walker ability to climb steps?  no do you drive?  yes use a wheelchair transfers alone Do you have any goals in this area?  no  Function disabled: date disabled . I need assistance with the following:  feeding, dressing, bathing, toileting, meal prep, household duties and shopping Do you have any goals in this area?  no  Neuro/Psych bladder control problems weakness numbness trouble walking depression anxiety  Prior Studies Any changes since last visit?  no  Physicians involved in your care Any changes since last visit?  no   Family History  Problem Relation Age of Onset  . Stroke Maternal Grandfather        27  . Heart attack Maternal Grandfather   . Hypertension Mother   . Psoriasis Mother   . Cancer Paternal Grandfather   . Heart attack Paternal Grandfather   . Stroke Paternal Uncle        age 58  . Stroke Maternal Grandmother   . Congestive Heart Failure Maternal Grandmother   . Heart attack Maternal Grandmother   . Protein C deficiency Sister  59       Miscarriages   Social History   Socioeconomic History  . Marital status: Married    Spouse name: Not on file  . Number of children: 2  . Years of education: 4y college  . Highest education level: Not on file  Occupational History  . Occupation: Pediatric Nurse practitioner    Comment: Not working since [employer] 2015  Social Needs  . Financial resource strain: Not on file  . Food insecurity:    Worry: Not on file    Inability: Not on file  . Transportation needs:    Medical: Not on file    Non-medical: Not on file  Tobacco Use  . Smoking status: Never Smoker  . Smokeless tobacco: Never Used  Substance and Sexual Activity  . Alcohol use: No    Frequency: Never  . Drug use: No  . Sexual activity: Yes    Birth control/protection: None    Comment: patient is a transgender on testosterone shots, no biological kids  Lifestyle  . Physical activity:    Days per week: Not on file    Minutes per session: Not on file  . Stress: Not on file  Relationships  . Social connections:    Talks on phone: Not on file    Gets together: Not on file    Attends religious service: Not on file  Active member of club or organization: Not on file    Attends meetings of clubs or organizations: Not on file    Relationship status: Not on file  Other Topics Concern  . 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 Relocated to [place] about 2 months ago from [place], [place]. Patient was in [place] for last 4 years and prior to that in [place]. His wife is working as Scientist, research (physical sciences) for [employer]. Patient is not working and applying for disability. They have 2 kids but no biologic children.    Past Surgical History:  Procedure Laterality Date  . ABDOMINAL HYSTERECTOMY     TAH, BSO- tranverse incision.  [name] ANKLE ARTHROSCOPY WITH RECONSTRUCTION Right 2007  . CHOLECYSTECTOMY      laparoscopic  . HIP ARTHROSCOPY W/ LABRAL REPAIR Right 05/11/2013  . KNEE SURGERY Bilateral 1984   Right ACL, left PCL repair  . LIVER BIOPSY  2013   normal results.  [name] NASAL SEPTUM SURGERY N/A 09/20/2015  . THYROIDECTOMY, PARTIAL  2008  . TOTAL KNEE REVISION Left 02/06/2016   Procedure: LEFT TOTAL KNEE REVISION;  Surgeon: [name], MD;  [facility];  Service: Orthopedics;  Laterality: Left;  . TOTAL KNEE REVISION Left 04/22/2017   Procedure: Left knee polyethylene revision;  Surgeon: [name], MD;  [facility];  Service: Orthopedics;  Laterality: Left;   Past Medical History:  Diagnosis Date  . Abnormal weight loss   . Anxiety    PTSD per patient  . Celiac disease   . Cervical neck pain with evidence of disc disease    patient has a cyst   . Degenerative disc disease at L5-S1 level   . Family history of adverse reaction to anesthesia    family has problems with anesthesia of nausea and vomiting   . Gender bias    prefers Male gender identity- "remains with vagina"  . Gluten enteropathy   . H/O parotitis    right   . H/O protein C deficiency    Dr. Anne Fu  . History of kidney stones   . Hx-TIA (transient ischemic attack)    2015  . Neck pain   . Neuromuscular disorder ([place])    bilateral neuropathy feet.  . Polycythemia, secondary   . PONV (postoperative nausea and vomiting)   . Psoriasis   . psoriatic arthritis   . Sleep apnea    cpap use- Dr. [name] follows  . Stroke [facility])    Stroke left side -slight right sided weakness-Dr. [name] follows  . Syrinx of spinal cord ([place]) 01/06/2014   c spine on MRI  . Tachycardia    hx of   . Transfusion history    past history- none recent  . Transgendered    Ht 5' 8"  (1.727 m)   Wt 229 lb (103.9 kg)   BMI 34.82 kg/m   Opioid Risk Score:   Fall Risk Score:  `1  Depression screen PHQ 2/9  Depression screen [facility] 2/9 08/07/2017 06/24/2017 02/18/2017 11/10/2016 09/15/2016  Decreased Interest 3 0 1 0 0    Down, Depressed, Hopeless 3 0 1 0 0  PHQ - 2 Score 6 0 2 0 0  Altered sleeping - 0 - - -  Tired, decreased energy - 0 - - -  Change in appetite - 0 - - -  Feeling bad  or failure about yourself  - 0 - - -  Trouble concentrating - 0 - - -  Moving slowly or fidgety/restless - 0 - - -  Suicidal thoughts - 0 - - -  PHQ-9 Score - 0 - - -  Some recent data might be hidden    Review of Systems  Constitutional: Negative.   HENT: Negative.   Eyes: Negative.   Respiratory: Negative.   Cardiovascular: Negative.   Gastrointestinal: Negative.   Endocrine: Negative.   Genitourinary: Positive for difficulty urinating.  Musculoskeletal: Positive for arthralgias, back pain, gait problem, myalgias and neck pain.  Skin: Negative.   Neurological: Positive for weakness and numbness.  Hematological: Negative.   Psychiatric/Behavioral: Positive for dysphoric mood. The patient is nervous/anxious.   All other systems reviewed and are negative.      Objective:   Physical Exam  Constitutional: He is oriented to person, place, and time. He appears well-developed and well-nourished.  HENT:  Head: Normocephalic and atraumatic.  Neck: Normal range of motion. Neck supple.  Cardiovascular: Normal rate and regular rhythm.  Pulmonary/Chest: Effort normal and breath sounds normal.  Musculoskeletal:  Normal Muscle Bulk and Muscle Testing Reveals: Upper Extremities: Full ROM and Muscle Strength 4/5 Lumbar Paraspinal Tenderness: L-3-L-5 Left Greater Trochanter Tenderness Lower Extremities: Decreased ROM and Muscle Strength 4/5 Arrived in wheelchair   Neurological: He is alert and oriented to person, place, and time.  Skin: Skin is warm and dry.  Psychiatric: He has a normal mood and affect. His behavior is normal.  Nursing note and vitals reviewed.         Assessment & Plan:  1. Psoriatic arthritis with pain in multiple areas, most prominently feet, hands, elbows. Refilled:Kadian 60 mg 24 hr.  Capsule, one capsule every 12 hours #60 and Oxycodone 10 mg one tablet every 8 hours as needed #90. 09/14/2017 We will continue the opioid monitoring program, this consists of regular clinic visits, examinations, urine drug screen, pill counts as well as use of [place] Controlled Substance Reporting System.  Rheumatology Following. 2. Chronic Pain Syndrome: Continue Compound Cream.09/14/2017 3. Prior left sided CVA ('s) most substantial of which in May 2015 with residual right sided weakness, sensory loss, and expressive language deficits.: Continue to Monitor.09/14/2017 4. 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 Wynelle Link perform Total Left Knee Revision on 02/06/2016. S/P Left Knee Revision on 04/22/17.  5. Chronic mid- low back pain: Continue current medication regime, and encourage to increase activity as tolerated.09/14/2017 6. Polycythemia: Oncology Following.09/14/2017 7. Depression with anxiety : Continue to Monitor.09/14/2017 8. Cervicalgia: Continue HEP as tolerated and Continue to Monitor. Continue current medication regime. 09/14/2017 9. Muscle Spasticity: Continue Baclofen07/15/2019 10.Bilateral Ankle Pain: No complaints today. Continue to Monitor. 09/14/2017 10. Left lateral epicondylitis:No complaints today.Ortho Following.09/14/2017 11. Left Shoulder Pain:No complaints today.Ortho Following.09/14/2017 12. Lumbar Radiculitis: S/P ESI with Dr. Letta Pate. 08/07/2017, good relief noted.09/14/2017 13.Left Greater Trochanteric Bursitis: Continue with Ice and Heat Therapy: Contiue HEP as Tolerated. Continue to Monitor. 09/14/2017.  20 minutes of face to face patient care time was spent during this visit. All questions were encouraged and answered.  F/U in 1 month

## 2017-09-14 NOTE — Patient Instructions (Signed)
Dr Croitoru recommends that you schedule a follow-up appointment in 12 months. You will receive a reminder letter in the mail two months in advance. If you don't receive a letter, please call our office to schedule the follow-up appointment.  If you need a refill on your cardiac medications before your next appointment, please call your pharmacy. 

## 2017-09-15 ENCOUNTER — Ambulatory Visit (INDEPENDENT_AMBULATORY_CARE_PROVIDER_SITE_OTHER): Payer: BLUE CROSS/BLUE SHIELD | Admitting: Psychology

## 2017-09-15 DIAGNOSIS — F4312 Post-traumatic stress disorder, chronic: Secondary | ICD-10-CM | POA: Diagnosis not present

## 2017-09-16 ENCOUNTER — Encounter: Payer: Self-pay | Admitting: Cardiovascular Disease

## 2017-09-17 ENCOUNTER — Ambulatory Visit: Payer: BLUE CROSS/BLUE SHIELD | Admitting: Psychology

## 2017-09-22 ENCOUNTER — Ambulatory Visit (INDEPENDENT_AMBULATORY_CARE_PROVIDER_SITE_OTHER): Payer: BLUE CROSS/BLUE SHIELD | Admitting: Psychology

## 2017-09-22 DIAGNOSIS — F4312 Post-traumatic stress disorder, chronic: Secondary | ICD-10-CM | POA: Diagnosis not present

## 2017-09-23 ENCOUNTER — Encounter: Payer: Self-pay | Admitting: Family Medicine

## 2017-09-23 ENCOUNTER — Ambulatory Visit (INDEPENDENT_AMBULATORY_CARE_PROVIDER_SITE_OTHER): Payer: BLUE CROSS/BLUE SHIELD | Admitting: Family Medicine

## 2017-09-23 ENCOUNTER — Other Ambulatory Visit: Payer: Self-pay

## 2017-09-23 VITALS — BP 110/80 | HR 91 | Temp 98.0°F | Resp 16 | Ht 69.0 in | Wt 226.2 lb

## 2017-09-23 DIAGNOSIS — F640 Transsexualism: Secondary | ICD-10-CM

## 2017-09-23 DIAGNOSIS — Z79899 Other long term (current) drug therapy: Secondary | ICD-10-CM

## 2017-09-23 DIAGNOSIS — M170 Bilateral primary osteoarthritis of knee: Secondary | ICD-10-CM

## 2017-09-23 DIAGNOSIS — Z86718 Personal history of other venous thrombosis and embolism: Secondary | ICD-10-CM

## 2017-09-23 DIAGNOSIS — G939 Disorder of brain, unspecified: Secondary | ICD-10-CM

## 2017-09-23 DIAGNOSIS — S90462A Insect bite (nonvenomous), left great toe, initial encounter: Secondary | ICD-10-CM | POA: Diagnosis not present

## 2017-09-23 DIAGNOSIS — R634 Abnormal weight loss: Secondary | ICD-10-CM

## 2017-09-23 DIAGNOSIS — R0681 Apnea, not elsewhere classified: Secondary | ICD-10-CM

## 2017-09-23 DIAGNOSIS — R4189 Other symptoms and signs involving cognitive functions and awareness: Secondary | ICD-10-CM

## 2017-09-23 DIAGNOSIS — Z8719 Personal history of other diseases of the digestive system: Secondary | ICD-10-CM | POA: Diagnosis not present

## 2017-09-23 DIAGNOSIS — D508 Other iron deficiency anemias: Secondary | ICD-10-CM

## 2017-09-23 DIAGNOSIS — W57XXXA Bitten or stung by nonvenomous insect and other nonvenomous arthropods, initial encounter: Secondary | ICD-10-CM | POA: Diagnosis not present

## 2017-09-23 DIAGNOSIS — R269 Unspecified abnormalities of gait and mobility: Secondary | ICD-10-CM

## 2017-09-23 DIAGNOSIS — G609 Hereditary and idiopathic neuropathy, unspecified: Secondary | ICD-10-CM

## 2017-09-23 DIAGNOSIS — N62 Hypertrophy of breast: Secondary | ICD-10-CM

## 2017-09-23 DIAGNOSIS — D6859 Other primary thrombophilia: Secondary | ICD-10-CM

## 2017-09-23 DIAGNOSIS — M7522 Bicipital tendinitis, left shoulder: Secondary | ICD-10-CM

## 2017-09-23 DIAGNOSIS — Z96659 Presence of unspecified artificial knee joint: Secondary | ICD-10-CM

## 2017-09-23 DIAGNOSIS — D45 Polycythemia vera: Secondary | ICD-10-CM

## 2017-09-23 DIAGNOSIS — R748 Abnormal levels of other serum enzymes: Secondary | ICD-10-CM

## 2017-09-23 DIAGNOSIS — K900 Celiac disease: Secondary | ICD-10-CM

## 2017-09-23 DIAGNOSIS — G4733 Obstructive sleep apnea (adult) (pediatric): Secondary | ICD-10-CM

## 2017-09-23 DIAGNOSIS — K760 Fatty (change of) liver, not elsewhere classified: Secondary | ICD-10-CM

## 2017-09-23 DIAGNOSIS — Z789 Other specified health status: Secondary | ICD-10-CM

## 2017-09-23 DIAGNOSIS — T84018S Broken internal joint prosthesis, other site, sequela: Secondary | ICD-10-CM

## 2017-09-23 DIAGNOSIS — D72819 Decreased white blood cell count, unspecified: Secondary | ICD-10-CM

## 2017-09-23 DIAGNOSIS — Q6211 Congenital occlusion of ureteropelvic junction: Secondary | ICD-10-CM

## 2017-09-23 DIAGNOSIS — M5416 Radiculopathy, lumbar region: Secondary | ICD-10-CM

## 2017-09-23 DIAGNOSIS — Z7901 Long term (current) use of anticoagulants: Secondary | ICD-10-CM

## 2017-09-23 DIAGNOSIS — Z8673 Personal history of transient ischemic attack (TIA), and cerebral infarction without residual deficits: Secondary | ICD-10-CM

## 2017-09-23 DIAGNOSIS — E041 Nontoxic single thyroid nodule: Secondary | ICD-10-CM

## 2017-09-23 DIAGNOSIS — Z9229 Personal history of other drug therapy: Secondary | ICD-10-CM

## 2017-09-23 DIAGNOSIS — G8101 Flaccid hemiplegia affecting right dominant side: Secondary | ICD-10-CM

## 2017-09-23 DIAGNOSIS — R29898 Other symptoms and signs involving the musculoskeletal system: Secondary | ICD-10-CM

## 2017-09-23 NOTE — Progress Notes (Addendum)
Subjective:  By signing my name below, I, Jesse Bowers, attest that this documentation has been prepared under the direction and in the presence of [name], MD Electronically Signed: Ladene Artist, ED Scribe 09/23/2017 at 12:19 PM.   Patient ID: Jesse Bowers, adult    DOB: Jul 11, 1965, 52 y.o.   MRN: [id number]  Chief Complaint  Patient presents with  . Tick bite    left great toe, x 3 mos   HPI  Jesse Bowers is a 52 y.o. adult who presents to Primary Care at [facility] to establish care for gender-affirming hormone therapy which he has been on for > 15 yrs.    Tick embedded (prior) To the L great toe first noticed 3 months prior. Pt states that he initially tried scrubbing his L great toe since he thought it was dirt from his shoes until he finally dug the tick out just 1 wk ago - so was likely embedded x 3 mos or longer. He has noticed a non-specific non-painful rash near the L great toe near the bite over the past 3 months. Pt also states that he was experiencing extreme fatigue 1 month ago in which he slept for ~18 hrs/day, however, he attributes this to anemia (reportedly lowest Hgb was 9).  No h/o lyme or tick born illnesses.  Gender-Affirming Hormone Therapy Pt takes 100 mg (=0.5 mL) testosterone every Friday though occasionally he forgets and is Saturday (happened last week). He has been on the same dose since he started ~15 yrs ago. States Dr. Loanne Drilling decreased his dose to 25 mg due to polycythemia although he has never had a high T level. He requests lab work today. Last labs were over a yr.  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  Later when he had to have an ovarian cyst the size of a grapefruit removed, he was told that the had a shortened vagina.  Abnormal LFTs Pt states that his Alk phos is always elevated - for  over a decade.  Occasionally AST/ALT are too. Had a liver biopsy in 2014 that just showed mild fatty liver. He is following with Dr. Elease Hashimoto at [facility] regularly and so far no etiology has been found. Has been vaccinated against Hep A & B but never developed immune response so still tests non-reactive.  H/o Polycythemia States his hgb ranged 18-19 while in college, prior to celiac and long prior to ever starting testosterone therapy and hgb has not sig increased since starting testosterone. Paternal uncle also has polycythemia and suffered a TIA at 42 as well.  However, now pt is anemic (lowest hgb 9 - not sure if know etiology - likely being eval by Dr. Burr Medico.  Protein C deficiency Diagnosed during hematology testing when his protein C level came back low in the w/o to find possible etiology of his second unprovoked DVT being done by his family doc Dr. Cresenciano Lick in [place].  He was then referred to [facility] in [place] for further evaluation where he was seen 08/2013. His protein C level was tested monthly x 3 and all results were diagnostically low - initial in 30s then <20s. Last visit was 09/25/13 as he then moved to [place] in August 2015 but reports his hematologist was VERY worried about him  and really didn't think he should make the trip until he had better continuity of medical care and was more stable so mae him give himself lovenox inj throughout the drive. Half-sister Ramond Craver (they share the same mother) came back positive for protein C as well but their mother was not tested.  However, there is a h/o early CVA on maternal side in both his maternal GM and GF. His father tested neg.  Since he diagnosis he has had 1 more DVTs -a total of 3 in his life in both upper and lower extremities. Last was a DVT in the Right thigh for 3 months following knee surgery. Now following w/ Dr. Burr Medico and pt is on chronic anticoag Xarelto for management.  H/o CVA  vs TIA  Initial acute neurologic event occurred in May 2015 while he was still living in [place] - developed sudden Rt hemiparesis, went to ER and was told that he had had a CVA due to acute small vessel cerebral disease.  States he has not been able to get records from the diagnosing physician Dr. Ronny Flurry since so pt thinks perhaps Dr. Ronny Flurry may have been looking at the wrong chart when he told pt that he had had a CVA as the records received by providers here from that ER visit do not show diagnosis of CVA.   Pt then moved to [place] in August 2015 - he had to be on lovenox during the car ride down.   The Rt weakness, numbness, and tingling did improve some over the next several years as he followed with Dr. [name] at [facility].  Psoriasis and Psoriatic Arthritis H/o psoriasis to his scalp, groin and ears.  Pt has been on biological since 2013 for psoriatic arthritis. He is also in an electrical wheelchair in part for this though can ambulate using braces for short distances - fatigues easily. Dermatologist: Rosendo Gros at [facility]. Rheumatologist: Dr. Zella Richer at [facility].  Sleep Apnea Pt reports central sleep apnea on C-PAP (but pt should be on bi-pap per prior neuro recs)- had h/o OSA. Dr. [name] repeated sleep study 06/18/15 whish showed severe OSA AND CSA as well as hypersomnia with sleep apnea which required bipap (setting in A&P). States he has lost 20 lbs and gotten deviated septum fixed by Dr. [name] since diagnosis but has not had a rpt sleep study.   Bilateral lower extremity weakness worsening  Started with the 2015 cerebral ischemic event causing right hemiparesis.     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 He reports that R side seems to be affected by initial TIAs and the L side seems to be affected by syrinx and C4 stenosis. Since then he has been more dependentl upon his electric  wheelchair (also rx'd for psoriatic arthritis) as  B lower ext weakness is progressing with loss of muscle mass though he does triy to walk with braces when home but ability decreasing. now lumbar DDD and radiculopathy worsening lower ext weakness as well as ? Of peripheral neuropathy.    He has back pain due to L5-S1 degenerative disc disease and stenosis causing lumbar radiculopathy which is also exacerbating weakness and pain. Chart notes dx of bilateral lower extremity peripheral neuropathy -I do not know if there is a known etiology for this or if it due to the DDD/pinched nerves.  He notes that he has weakness bilaterally but more pain on the  left and he feels that his legs are getting weaker as well is losing muscle and girth.   Chronic pain syndrome He is in pain management with Dr. Tessa Lerner on [facility] dependent on chronic opioids for psoriatic arthritis and cervical and lumber L5-D1 DDD stenosis with lumbar radiculitis causing neuropathic pain.  Has had bilateral knee surgeries and the Left replaced twice  Cognitive decline Reports that he has been forgetting things recently which he attributes to cognitive decline for which he sees clinical neuropsychologist Dr. Orvilla Fus at [facility]. In 2017, pt reports Dr. [name] wrote a letter stating in part: "taken together, combined effect of physical,cognitive & psychiatric symptomology would, IMO, render him incapable of sustaining any type of competitive employment for an indefinite period of time.He isn't seen to be a good candidate for voc rehab at this time either."   Dr. [name] and rheumatology concurred which allowed him to get temporary disability for 3 yrs.   Mood d/o Sees therapist Dr. Rexene Edison. Psychiatrist: Dr. Erling Cruz.    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  Anaphylactic reaction to pcn and Steven-Johnsons rash to sulfamethoxazole (so presume any sulfa drug.)  Past Medical History:  Diagnosis Date  .  Abnormal weight loss   . Anxiety    PTSD per patient  . Celiac disease   . Cervical neck pain with evidence of disc disease    patient has a cyst   . Degenerative disc disease at L5-S1 level   . Family history of adverse reaction to anesthesia    family has problems with anesthesia of nausea and vomiting   . Gender bias    prefers Male gender identity- "remains with vagina"  . Gluten enteropathy   . H/O parotitis    right   . H/O protein C deficiency    Dr. Anne Fu  . History of kidney stones   . Hx-TIA (transient ischemic attack)    2015  . Neck pain   . Neuromuscular disorder ([place])    bilateral neuropathy feet.  . Polycythemia, secondary   . PONV (postoperative nausea and vomiting)   . Psoriasis   . psoriatic arthritis   . Sleep apnea    cpap use- Dr. [name] follows  . Stroke [facility])    Stroke left side -slight right sided weakness-Dr. [name] follows  . Syrinx of spinal cord ([place]) 01/06/2014   c spine on MRI  . Tachycardia    hx of   . Transfusion history    past history- none recent  . Transgendered     Past Surgical History:  Procedure Laterality Date  . ABDOMINAL HYSTERECTOMY     TAH, BSO- tranverse incision.  [name] ANKLE ARTHROSCOPY WITH RECONSTRUCTION Right 2007  . CHOLECYSTECTOMY     laparoscopic  . HIP ARTHROSCOPY W/ LABRAL REPAIR Right 05/11/2013  . KNEE SURGERY Bilateral 1984   Right ACL, left PCL repair  . LIVER BIOPSY  2013   normal results.  [name] NASAL SEPTUM SURGERY N/A 09/20/2015  . THYROIDECTOMY, PARTIAL  2008  . TOTAL KNEE REVISION Left 02/06/2016   Procedure: LEFT TOTAL KNEE REVISION;  Surgeon: [name], MD;  Location: [facility];  Service: Orthopedics;  Laterality: Left;  . TOTAL KNEE REVISION Left 04/22/2017   Procedure: Left knee polyethylene revision;  Surgeon: [name], MD;  Location: [facility];  Service: Orthopedics;  Laterality: Left;   Current Outpatient Medications on File Prior to Visit  Medication Sig Dispense  Refill  . baclofen  (LIORESAL) 20 MG tablet Take 1 tablet (20 mg total) by mouth 4 (four) times daily. 120 tablet 3  . betamethasone dipropionate (DIPROLENE) 0.05 % cream Apply topically 2 (two) times daily.    . Calcipotriene-Betameth Diprop (ENSTILAR) 0.005-0.064 % FOAM Apply 1 application topically 2 (two) times daily.     . clorazepate (TRANXENE) 15 MG tablet Take 15 mg by mouth at bedtime.    . clorazepate (TRANXENE) 7.5 MG tablet Take 7.5 mg by mouth 2 (two) times daily.     [name] desonide (DESOWEN) 0.05 % ointment APPLY TO AFFECTED AREAS TWICE DAILY AS NEEDED FOR PSORIASIS.  2  . furosemide (LASIX) 40 MG tablet Take 1 tablet (40 mg total) by mouth daily. Take 1 extra tab daily prn for a weight gain of 3lbs in a day or 5 lbs per week. 36 tablet 6  . morphine (KADIAN) 60 MG 24 hr capsule Take 1 capsule (60 mg total) by mouth every 12 (twelve) hours. 60 capsule 0  . neomycin-polymyxin-hydrocortisone (CORTISPORIN) 3.5-10000-1 OTIC suspension PLACE 4 DROPS INTO LEFT EAR TWICE A DAY AS NEEDED FOR PAIN AND DRAINAGE  1  . neomycin-polymyxin-hydrocortisone (CORTISPORIN) OTIC solution 4 (four) times daily.    . NONFORMULARY OR COMPOUNDED ITEM Apply 1 application topically 3 (three) times daily. 8% ketamine, 5% amitriptyline, 5% baclofen, 5% gabapentin  60 GM (Patient taking differently: Apply 1 application topically 3 (three) times daily as needed (pain). 8% ketamine, 5% amitriptyline, 5% baclofen, 5% gabapentin  60 GM) 1 each 5  . Oxycodone HCl 10 MG TABS Take 1 tablet (10 mg total) by mouth every 8 (eight) hours as needed ((score 7 to 10)). Do Not Fill Before 09/24/2017 90 tablet 0  . potassium chloride (K-DUR) 10 MEQ tablet TAKE 1 TABLET EVERY DAY AS NEEDED TAKE EVERY TIME YOU TAKE FUROSEMIDE 30 tablet 9  . prazosin (MINIPRESS) 1 MG capsule Take 1 mg by mouth at bedtime. Takes 1 mg and 2 mg to equal 3 mg at bedtime    . prazosin (MINIPRESS) 2 MG capsule Take 2 mg by mouth at bedtime. Takes 1 mg and 2 mg to equal 3 mg at  bedtime    . rivaroxaban (XARELTO) 20 MG TABS tablet TAKE 1 TABLET DAILY BEFORE BEDTIME 30 tablet 6  . Secukinumab (COSENTYX [place]) Inject into the skin.    [name] testosterone cypionate (DEPO-TESTOSTERONE) 200 MG/ML injection Inject 0.25 mLs (50 mg total) into the muscle once a week. (Patient taking differently: Inject 100 mg into the muscle once a week. Friday) 10 mL 0  . Ursodiol (ACTIGALL PO) Take by mouth.    . ursodiol (ACTIGALL) 500 MG tablet Take 500 mg by mouth 3 (three) times daily.    [name] venlafaxine XR (EFFEXOR-XR) 150 MG 24 hr capsule Take 300 mg by mouth daily with breakfast.    . potassium chloride 20 MEQ TBCR Take 20 mEq by mouth daily. If you take > 1 extra dose of Lasix in a week, take extra 20 meq Kdur (Patient not taking: Reported on 09/23/2017) 36 tablet 6   No current facility-administered medications on file prior to visit.    Allergies  Allergen Reactions  . Penicillin G Anaphylaxis and Other (See Comments)    Has patient had a PCN reaction causing immediate rash, facial/tongue/throat swelling, SOB or lightheadedness with hypotension: Yes Has patient had a PCN reaction causing severe rash involving mucus membranes or skin necrosis: No Has patient had a PCN reaction that  required hospitalization Yes Has patient had a PCN reaction occurring within the last 10 years: No If all of the above answers are "NO", then may proceed with Cephalosporin use.   . Vancomycin Rash and Other (See Comments)    RED MAN SYNDROME CAN HAVE IF GIVEN OVER 2HOURS  . Duloxetine Other (See Comments)    Restless legs  . Gabapentin Nausea Only and Other (See Comments)    Other reaction(s): nausea, mental status  . Tegaderm Ag Mesh [Silver] Other (See Comments)    Causes blistering wounds   . Acetaminophen Other (See Comments)    Elevates liver enzymes.   . Ibuprofen Other (See Comments)    Contraindicated with Xarelto.   . Nortriptyline Other (See Comments)    Dry mouth at 25 mg dose.  Tolerates  10 mg dose  . Pregabalin Other (See Comments)    Ineffective  . Sulfa Antibiotics Rash and Other (See Comments)    Stevens-Johnson rash  . Sulfacetamide Sodium-Sulfur Rash   Family History  Problem Relation Age of Onset  . Stroke Maternal Grandfather        67  . Heart attack Maternal Grandfather   . Hypertension Mother   . Psoriasis Mother   . Cancer Paternal Grandfather   . Heart attack Paternal Grandfather   . Stroke Paternal Uncle        age 55  . Stroke Maternal Grandmother   . Congestive Heart Failure Maternal Grandmother   . Heart attack Maternal Grandmother   . Protein C deficiency Sister 8       Miscarriages   Social History   Socioeconomic History  . Marital status: Married    Spouse name: Not on file  . Number of children: 2  . Years of education: 4y college  . Highest education level: Not on file  Occupational History  . Occupation: Pediatric Nurse practitioner    Comment: Not working since [place] 2015  Social Needs  . Financial resource strain: Not on file  . Food insecurity:    Worry: Not on file    Inability: Not on file  . Transportation needs:    Medical: Not on file    Non-medical: Not on file  Tobacco Use  . Smoking status: Never Smoker  . Smokeless tobacco: Never Used  Substance and Sexual Activity  . Alcohol use: No    Frequency: Never  . Drug use: No  . Sexual activity: Yes    Birth control/protection: None    Comment: patient is a transgender on testosterone shots, no biological kids  Lifestyle  . Physical activity:    Days per week: Not on file    Minutes per session: Not on file  . Stress: Not on file  Relationships  . Social connections:    Talks on phone: Not on file    Gets together: Not on file    Attends religious service: Not on file    Active member of club or organization: Not on file    Attends meetings of clubs or organizations: Not on file    Relationship status: Not on file  Other Topics Concern  . Not on file    Social History Narrative   Education 4 year college, former Therapist, sports X 15 years, pediatric nurse practitioner x 6 years, did NP degree from [employer] [place]. Relocated to [place] about 2 months ago from [place], [place]. Patient was in [place] for last 4 years and prior to that in [place]. His wife, Helene Kelp  Wong, is working as Scientist, research (physical sciences) for [employer]. Patient is not working and applying for disability. They have 2 kids but no biologic children.    Depression screen [facility] 2/9 09/23/2017 08/07/2017 06/24/2017 02/18/2017 11/10/2016  Decreased Interest 0 3 0 1 0  Down, Depressed, Hopeless 0 3 0 1 0  PHQ - 2 Score 0 6 0 2 0  Altered sleeping - - 0 - -  Tired, decreased energy - - 0 - -  Change in appetite - - 0 - -  Feeling bad or failure about yourself  - - 0 - -  Trouble concentrating - - 0 - -  Moving slowly or fidgety/restless - - 0 - -  Suicidal thoughts - - 0 - -  PHQ-9 Score - - 0 - -  Some recent data might be hidden   Review of Systems  Constitutional: Positive for fatigue (resolved).  Skin: Positive for rash (L foot) and wound (+insect bite to L great toe).      Objective:   Physical Exam  Constitutional: He is oriented to person, place, and time. He appears well-developed and well-nourished. No distress.  HENT:  Head: Normocephalic and atraumatic.  Eyes: Conjunctivae and EOM are normal.  Neck: Neck supple. No tracheal deviation present.  Cardiovascular: Normal rate.  Pulmonary/Chest: Effort normal. No respiratory distress.  Musculoskeletal: Normal range of motion.  Neurological: He is alert and oriented to person, place, and time.  Skin: Skin is warm and dry.  Psychiatric: He has a normal mood and affect. His behavior is normal.  Nursing note and vitals reviewed.  BP 110/80 (BP Location: Left Arm, Patient Position: Sitting, Cuff Size: Large)   Pulse 91   Temp 98 F (36.7 C) (Oral)   Resp 16   Ht 5' 9"  (1.753 m) Comment: per pt  Wt 226 lb 3.2 oz (102.6 kg) Comment: per  pt  SpO2 97%   BMI 33.40 kg/m      Rash noted proximal to embedded tick x 3 mos removed 1 wk ago - rash present x sev mos  Assessment & Plan:   1. Encounter for long-term current use of high risk medication - pt reports injeted 122m last Sat - so 4d ago which provides perfect time to check T and estradiol levels - right at midpoint between injections so would aim to see pt in mid of normal range - 500-600 ADDENDUM:   2. Tick bite, initial encounter - not typical rash for either lyme, stari, or rmsf but will check titers since tick was on for so long - cont to monitor and go to ER if spreading and/or if develop any flu like sxs    3. Celiac disease - gluten enteropathy and difficulty absorbing iron so on 490miron/d per GI doc  4. Other Iron deficiency anemia - inability to absorb through GI tract pre pt per GI so on po supp per GI of 89m9mron qd   5. Transgender - stable, doing great as he transitioned long ago - difficult and uncomfortable to ever think about male hx.  6. OSA (obstructive sleep apnea) - PNSG split night [facility] - had prior h/o OSA diagnosis and this study confirmed pt had severe obstructive sleep apnea in addition to central sleep apnea and hypersomnia with sleep apnea.  Dr. SatFelecia Shellinges report that with weight loss the occurrences could be significantly reduced and as patient reports he has had a 20 lb  weight loss as well as ENT surgery to fix deviated septum -may be worth considering repeat study  7. Central apnea -patient actually requires BiPAP rather than CPAP due to central apneas which due to persistence and severity were quite severe so failed bi-level S/T and requires ASV IPAP 12, EPAP 7 cm, Max PS 15 cm, Min PS 4 cm with heated humidifier.  Mask was ResMed Airfit F20 FFM  8. Cognitive decline - seeing clinical neuropsych [name] at [facility] for testing  10. Osteoarthritis of both knees, unspecified osteoarthritis  type - left replaced w/ revision by Dr. Maureen Ralphs 04/2017  11. Lumbar radiculitis   12. Protein C deficiency ([place]) - on chronic anticoagulation since diagnosed after DVT in 2007  13. Polycythemia, secondary - h/o as a teen long before he ever started on test and never saw his elevated baseline hgb increase while on testosterone.  However, now he has become anemia (hgb down to 9) poss due to lack of iron absorption through GI tract due to celiac  14. H/O TIA (transient ischemic attack) and stroke - Dr. [name] at [facility] retired I think so pt liekly would highly benefit form new neurology c/s - trans friendly  15. Weight loss, unintentional - Pt weight maxed out at 242 10 mos ago, then was 328 3 mos and 1 wk prior, down to 232 lbs 1 mo ago, now last to 226 ADDENDUM: pt down to 219 lbs 3 wks later - has unintentionally lost 23 lbs in prior 10 mos   16. Weakness of both lower extremities - from lumbar L5-S1 DDD w/ stenosis causing radiculitis vs progressive idiopathic peripheral neuropathy or other? needs neuro eval - EMG/NCV?   Also h/o syncope noted in outside problem list - will need to ask pt about that at f/u. . .  Results in Care Everywhere at [facility]: 12/2010 Pt labs showed a reactive ANA with SS-B (Sjogren's) ab equivocal at 113 (nml 0-99) and equivocal anti-Smith Ab at 114 (nml 0-99) and Anti-RNP/SM Ab reactive at 128 (nml 0-99), Anti-scleroderma (SCL 70 Ab) reactive 217 (nml 0-99), Anti-JO-1 IgG Ab reactive at 164 (nml 0-99).  Interestingly, ANA had been nonreactive just 5 months prior when tested but did have a positive anti-smooth muscle IgG at 30 (neg <20)).  He was then seen by rheumatologist Dr. Willy Eddy as he was also having FUO (neg bld/urine clx) Patient was tested for Q fever, parvovirus, chlamydia psittaci, histoplasmosis, CMV, HIV, Lyme, EBV, Wilson's with ferritin and ceruloplasmin, alpha 1 antitrypsin.    Not sure whatever happened w/ this - will d/w pt at his next OV or  need additional time to review Care Everywhere - he is still seeing rheum at [facility] regularly so may be when he was diagnosed with the psoriatic arthritis   Orders Placed This Encounter  Procedures  . Lyme Ab/Western Blot Reflex  . Rickettsial Fever Group IgG/M  . TestT+TestF+SHBG  . Estradiol  . Vitamin B12  . Iron, TIBC and Ferritin Panel  . Iron, TIBC and Ferritin Panel    I personally performed the services described in this documentation, which was scribed in my presence. The recorded information has been reviewed and considered, and addended by me as needed.   [name], M.D.  Primary Care at [facility] [address] [place], [place] [address] [phone] phone [phone] fax  10/30/17 12:04 PM

## 2017-09-23 NOTE — Patient Instructions (Signed)
     IF you received an x-ray today, you will receive an invoice from [facility]. Please contact [facility] at [phone] with questions or concerns regarding your invoice.   IF you received labwork today, you will receive an invoice from [facility]. Please contact [facility] at [phone] with questions or concerns regarding your invoice.   Our billing staff will not be able to assist you with questions regarding bills from these companies.  You will be contacted with the lab results as soon as they are available. The fastest way to get your results is to activate your My Chart account. Instructions are located on the last page of this paperwork. If you have not heard from us regarding the results in 2 weeks, please contact this office.     

## 2017-09-24 ENCOUNTER — Ambulatory Visit: Payer: BLUE CROSS/BLUE SHIELD | Admitting: Psychology

## 2017-09-27 ENCOUNTER — Encounter: Payer: Self-pay | Admitting: Family Medicine

## 2017-09-29 ENCOUNTER — Ambulatory Visit (INDEPENDENT_AMBULATORY_CARE_PROVIDER_SITE_OTHER): Payer: BLUE CROSS/BLUE SHIELD | Admitting: Psychology

## 2017-09-29 DIAGNOSIS — F4312 Post-traumatic stress disorder, chronic: Secondary | ICD-10-CM | POA: Diagnosis not present

## 2017-09-30 LAB — TESTT+TESTF+SHBG
Sex Hormone Binding: 45.6 nmol/L (ref 19.3–76.4)
TESTOSTERONE, TOTAL: 917.6 ng/dL — AB (ref 264.0–916.0)
Testosterone, Free: 11.4 pg/mL (ref 7.2–24.0)

## 2017-09-30 LAB — LYME AB/WESTERN BLOT REFLEX: LYME DISEASE AB, QUANT, IGM: <0.8 index (ref 0.00–0.79)

## 2017-09-30 LAB — IRON,TIBC AND FERRITIN PANEL
Ferritin: 19 ng/mL — ABNORMAL LOW (ref 30–400)
IRON: 56 ug/dL (ref 38–169)
Iron Saturation: 12 % — ABNORMAL LOW (ref 15–55)
Total Iron Binding Capacity: 484 ug/dL — ABNORMAL HIGH (ref 250–450)
UIBC: 428 ug/dL — AB (ref 111–343)

## 2017-09-30 LAB — VITAMIN B12: VITAMIN B 12: 384 pg/mL (ref 232–1245)

## 2017-09-30 LAB — ESTRADIOL: ESTRADIOL: 38.1 pg/mL (ref 7.6–42.6)

## 2017-09-30 LAB — RICKETTSIAL FEVER GROUP IGG/M
Spotted Fever Group IgM: <1:64 {titer}
Typhus Fever Group IgG: <1:64 {titer}
Typhus Fever Group IgM: <1:64 {titer}

## 2017-10-01 ENCOUNTER — Encounter: Payer: Self-pay | Admitting: Family Medicine

## 2017-10-01 ENCOUNTER — Ambulatory Visit: Payer: BLUE CROSS/BLUE SHIELD | Admitting: Psychology

## 2017-10-01 ENCOUNTER — Other Ambulatory Visit: Payer: Self-pay

## 2017-10-01 ENCOUNTER — Telehealth: Payer: Self-pay | Admitting: Cardiovascular Disease

## 2017-10-01 MED ORDER — POTASSIUM CHLORIDE ER 10 MEQ PO TBCR: 10.0000 meq | EXTENDED_RELEASE_TABLET | Freq: Two times a day (BID) | ORAL | 2 refills | Status: DC

## 2017-10-01 MED ORDER — FUROSEMIDE 40 MG PO TABS: 40.0000 mg | ORAL_TABLET | Freq: Every day | ORAL | 0 refills | Status: DC

## 2017-10-01 NOTE — Telephone Encounter (Signed)
Pt calling stating that he did not have enough potassium 10 meq tablets sent to his pharmacy. Per Rosaria Ferries, PA, it was ok for pt to take potassium 10 meq tablets because the 20 meq tablets were to big. So pt should be getting potassium 10 meq 2 tablets BID and pt did not have enough furosemide 40 mg tablets sent in as well. Pt is to take a extra tablet. So pt can take 2 tablet daily. Pt would like a call back concerning this matter at [phone]. Please address

## 2017-10-01 NOTE — Telephone Encounter (Signed)
New message     *STAT* If patient is at the pharmacy, call can be transferred to refill team.   1. Which medications need to be refilled? (please list name of each medication and dose if known) potassium chloride (K-DUR) 10 MEQ tablet  2. Which pharmacy/location (including street and city if local pharmacy) is medication to be sent to? [id number] [place], [place] - [address]. Do they need a 30 day or 90 day supply? [address]

## 2017-10-02 NOTE — Telephone Encounter (Signed)
SENT YESTERDAY

## 2017-10-05 ENCOUNTER — Encounter: Payer: Self-pay | Admitting: Family Medicine

## 2017-10-06 ENCOUNTER — Ambulatory Visit (INDEPENDENT_AMBULATORY_CARE_PROVIDER_SITE_OTHER): Payer: BLUE CROSS/BLUE SHIELD | Admitting: Psychology

## 2017-10-06 DIAGNOSIS — F4312 Post-traumatic stress disorder, chronic: Secondary | ICD-10-CM

## 2017-10-07 ENCOUNTER — Encounter: Payer: Self-pay | Admitting: Family Medicine

## 2017-10-07 ENCOUNTER — Encounter: Payer: Self-pay | Admitting: Cardiovascular Disease

## 2017-10-07 ENCOUNTER — Other Ambulatory Visit: Payer: Self-pay | Admitting: Nurse Practitioner

## 2017-10-08 ENCOUNTER — Ambulatory Visit (INDEPENDENT_AMBULATORY_CARE_PROVIDER_SITE_OTHER): Payer: BLUE CROSS/BLUE SHIELD | Admitting: Psychology

## 2017-10-08 DIAGNOSIS — F4310 Post-traumatic stress disorder, unspecified: Secondary | ICD-10-CM | POA: Diagnosis not present

## 2017-10-09 ENCOUNTER — Other Ambulatory Visit: Payer: Self-pay

## 2017-10-09 ENCOUNTER — Telehealth: Payer: Self-pay | Admitting: Family Medicine

## 2017-10-09 ENCOUNTER — Encounter: Payer: Self-pay | Admitting: Hematology

## 2017-10-09 DIAGNOSIS — M25552 Pain in left hip: Secondary | ICD-10-CM | POA: Insufficient documentation

## 2017-10-09 DIAGNOSIS — D751 Secondary polycythemia: Secondary | ICD-10-CM

## 2017-10-09 MED ORDER — RIVAROXABAN 20 MG PO TABS: ORAL_TABLET | ORAL | 3 refills | Status: DC

## 2017-10-09 NOTE — Telephone Encounter (Signed)
Copied from [place] [phone] [id number]. Topic: Quick Communication - See Telephone Encounter >> Oct 09, 2017  5:11 PM Rutherford Nail, [place] wrote: CRM for notification. See Telephone encounter for: 10/09/17. Patient calling and states that he would like a call back from Dr Brigitte Pulse to discuss his testosterone. Please advise.

## 2017-10-09 NOTE — Telephone Encounter (Signed)
Called pt. To reschedule appt. On 12/24/17. Rescheduled

## 2017-10-12 ENCOUNTER — Other Ambulatory Visit: Payer: Self-pay | Admitting: Hematology

## 2017-10-12 DIAGNOSIS — D751 Secondary polycythemia: Secondary | ICD-10-CM

## 2017-10-12 MED ORDER — RIVAROXABAN 20 MG PO TABS: ORAL_TABLET | ORAL | 1 refills | Status: DC

## 2017-10-13 ENCOUNTER — Ambulatory Visit: Payer: Self-pay | Admitting: Psychology

## 2017-10-13 NOTE — Telephone Encounter (Signed)
Message and question from patient.

## 2017-10-14 ENCOUNTER — Other Ambulatory Visit: Payer: Self-pay

## 2017-10-14 ENCOUNTER — Encounter
Payer: BLUE CROSS/BLUE SHIELD | Attending: Physical Medicine & Rehabilitation | Admitting: Physical Medicine & Rehabilitation

## 2017-10-14 ENCOUNTER — Encounter: Payer: Self-pay | Admitting: Physical Medicine & Rehabilitation

## 2017-10-14 VITALS — BP 137/83 | HR 117 | Ht 69.0 in | Wt 219.1 lb

## 2017-10-14 DIAGNOSIS — I69351 Hemiplegia and hemiparesis following cerebral infarction affecting right dominant side: Secondary | ICD-10-CM | POA: Diagnosis not present

## 2017-10-14 DIAGNOSIS — Z86718 Personal history of other venous thrombosis and embolism: Secondary | ICD-10-CM | POA: Diagnosis not present

## 2017-10-14 DIAGNOSIS — Z79899 Other long term (current) drug therapy: Secondary | ICD-10-CM | POA: Diagnosis not present

## 2017-10-14 DIAGNOSIS — L4050 Arthropathic psoriasis, unspecified: Secondary | ICD-10-CM | POA: Diagnosis not present

## 2017-10-14 DIAGNOSIS — M7062 Trochanteric bursitis, left hip: Secondary | ICD-10-CM

## 2017-10-14 DIAGNOSIS — D751 Secondary polycythemia: Secondary | ICD-10-CM | POA: Insufficient documentation

## 2017-10-14 DIAGNOSIS — M7522 Bicipital tendinitis, left shoulder: Secondary | ICD-10-CM | POA: Insufficient documentation

## 2017-10-14 DIAGNOSIS — I69320 Aphasia following cerebral infarction: Secondary | ICD-10-CM | POA: Diagnosis not present

## 2017-10-14 DIAGNOSIS — Z79891 Long term (current) use of opiate analgesic: Secondary | ICD-10-CM | POA: Insufficient documentation

## 2017-10-14 DIAGNOSIS — G8929 Other chronic pain: Secondary | ICD-10-CM | POA: Diagnosis present

## 2017-10-14 DIAGNOSIS — I69398 Other sequelae of cerebral infarction: Secondary | ICD-10-CM | POA: Insufficient documentation

## 2017-10-14 DIAGNOSIS — F418 Other specified anxiety disorders: Secondary | ICD-10-CM | POA: Insufficient documentation

## 2017-10-14 DIAGNOSIS — G894 Chronic pain syndrome: Secondary | ICD-10-CM | POA: Diagnosis not present

## 2017-10-14 DIAGNOSIS — Z7901 Long term (current) use of anticoagulants: Secondary | ICD-10-CM | POA: Diagnosis not present

## 2017-10-14 DIAGNOSIS — Z87890 Personal history of sex reassignment: Secondary | ICD-10-CM | POA: Diagnosis not present

## 2017-10-14 DIAGNOSIS — S43002A Unspecified subluxation of left shoulder joint, initial encounter: Secondary | ICD-10-CM | POA: Diagnosis not present

## 2017-10-14 DIAGNOSIS — M13862 Other specified arthritis, left knee: Secondary | ICD-10-CM | POA: Diagnosis not present

## 2017-10-14 DIAGNOSIS — M545 Low back pain: Secondary | ICD-10-CM | POA: Insufficient documentation

## 2017-10-14 DIAGNOSIS — Z9181 History of falling: Secondary | ICD-10-CM | POA: Diagnosis not present

## 2017-10-14 DIAGNOSIS — K900 Celiac disease: Secondary | ICD-10-CM | POA: Insufficient documentation

## 2017-10-14 DIAGNOSIS — R209 Unspecified disturbances of skin sensation: Secondary | ICD-10-CM | POA: Diagnosis not present

## 2017-10-14 DIAGNOSIS — D6859 Other primary thrombophilia: Secondary | ICD-10-CM | POA: Diagnosis not present

## 2017-10-14 DIAGNOSIS — R252 Cramp and spasm: Secondary | ICD-10-CM | POA: Diagnosis not present

## 2017-10-14 DIAGNOSIS — M5416 Radiculopathy, lumbar region: Secondary | ICD-10-CM

## 2017-10-14 MED ORDER — MORPHINE SULFATE ER 60 MG PO CP24: 60.0000 mg | ORAL_CAPSULE | Freq: Two times a day (BID) | ORAL | 0 refills | Status: DC

## 2017-10-14 MED ORDER — VENLAFAXINE HCL ER 150 MG PO CP24: 300.0000 mg | ORAL_CAPSULE | Freq: Every day | ORAL | 5 refills | Status: DC

## 2017-10-14 MED ORDER — OXYCODONE HCL 10 MG PO TABS: 10.0000 mg | ORAL_TABLET | Freq: Three times a day (TID) | ORAL | 0 refills | Status: DC | PRN

## 2017-10-14 NOTE — Telephone Encounter (Signed)
I told him I would help with the medication if he needed it filled. Sent rx to his pharmacy.

## 2017-10-14 NOTE — Telephone Encounter (Signed)
Patient left voicemail that he is taking Effexor XR 127m tablet 2 times every morning for the past 3 years.  Patient has been without this medication since August 1st.  Please call patient.

## 2017-10-14 NOTE — Telephone Encounter (Signed)
Jesse Bowers stopped at the window on his checkout and has requested a favor from Dr. [name].  The [facility] is where he typically receives his Effexor medication from but for a series of foul ups with his prescription, he has been out of medication for a few days and will not be able to receive his medication until Friday through his normal means and has asked if Doctor [name] could possible write for a Bridge Script for the next few days until his new prescription arrives from [facility].   Please advise.

## 2017-10-14 NOTE — Progress Notes (Signed)
Subjective:    Patient ID: Jesse Bowers, adult    DOB: 06-16-1965, 52 y.o.   MRN: [id number]  HPI   Jesse Bowers is here in follow up of his chronic pain and gait deficits. Apparently his effexor rx expired and he's had a problem with getting it refilled thru his psychiatrist. He's been off of it for 2+ weeks and has struggled with increased pain and anxiety since then. He states that his back is giving him more problems. He reports good relief with the two prior interlaminar injections and wonders what the possilibity of having another is. He is working with Museum/gallery curator on a new left AFo. The current has caused skin breakdown along the medial leg (due to misalignment of the support). He is hesitant to walk because of his brace/back pain.  He remains on kadian for pain control along with oxycodone. This does provide him some relief  Pain Inventory Average Pain 8 Pain Right Now 5 My pain is intermittent, sharp and stabbing  In the last 24 hours, has pain interfered with the following? General activity 9 Relation with others 10 Enjoyment of life 10 What TIME of day is your pain at its worst? night Sleep (in general) Poor  Pain is worse with: na Pain improves with: medication and injections Relief from Meds: 9  Mobility walk with assistance use a walker ability to climb steps?  no use a wheelchair transfers alone  Function disabled: date disabled 2014  Neuro/Psych bladder control problems weakness tremor trouble walking spasms  Prior Studies Any changes since last visit?  no  Physicians involved in your care Any changes since last visit?  no   Family History  Problem Relation Age of Onset  . Stroke Maternal Grandfather        25  . Heart attack Maternal Grandfather   . Hypertension Mother   . Psoriasis Mother   . Cancer Paternal Grandfather   . Heart attack Paternal Grandfather   . Stroke Paternal Uncle        age 82  . Stroke Maternal Grandmother   . Congestive Heart  Failure Maternal Grandmother   . Heart attack Maternal Grandmother   . Protein C deficiency Sister 32       Miscarriages   Social History   Socioeconomic History  . Marital status: Married    Spouse name: Not on file  . Number of children: 2  . Years of education: 4y college  . Highest education level: Not on file  Occupational History  . Occupation: Pediatric Nurse practitioner    Comment: Not working since [place] 2015  Social Needs  . Financial resource strain: Not on file  . Food insecurity:    Worry: Not on file    Inability: Not on file  . Transportation needs:    Medical: Not on file    Non-medical: Not on file  Tobacco Use  . Smoking status: Never Smoker  . Smokeless tobacco: Never Used  Substance and Sexual Activity  . Alcohol use: No    Frequency: Never  . Drug use: No  . Sexual activity: Yes    Birth control/protection: None    Comment: patient is a transgender on testosterone shots, no biological kids  Lifestyle  . Physical activity:    Days per week: Not on file    Minutes per session: Not on file  . Stress: Not on file  Relationships  . Social connections:    Talks on phone: Not on file  Gets together: Not on file    Attends religious service: Not on file    Active member of club or organization: Not on file    Attends meetings of clubs or organizations: Not on file    Relationship status: Not on file  Other Topics Concern  . Not on file  Social History Narrative   Education 4 year college, former Therapist, sports X 15 years, pediatric nurse practitioner x 6 years, did NP degree from [place] [employer] [place]. Relocated to [place] about 2 months ago from [place], [place]. Patient was in [place] for last 4 years and prior to that in [place]. His wife is working as Scientist, research (physical sciences) for [employer]. Patient is not working and applying for disability. They have 2 kids but no biologic children.    Past Surgical History:  Procedure Laterality Date  . ABDOMINAL HYSTERECTOMY      TAH, BSO- tranverse incision.  [name] ANKLE ARTHROSCOPY WITH RECONSTRUCTION Right 2007  . CHOLECYSTECTOMY     laparoscopic  . HIP ARTHROSCOPY W/ LABRAL REPAIR Right 05/11/2013  . KNEE SURGERY Bilateral 1984   Right ACL, left PCL repair  . LIVER BIOPSY  2013   normal results.  [name] NASAL SEPTUM SURGERY N/A 09/20/2015  . THYROIDECTOMY, PARTIAL  2008  . TOTAL KNEE REVISION Left 02/06/2016   Procedure: LEFT TOTAL KNEE REVISION;  Surgeon: [name], MD;  Location: [facility];  Service: Orthopedics;  Laterality: Left;  . TOTAL KNEE REVISION Left 04/22/2017   Procedure: Left knee polyethylene revision;  Surgeon: [name], [place];  Location: [facility];  Service: Orthopedics;  Laterality: Left;   Past Medical History:  Diagnosis Date  . Abnormal weight loss   . Anxiety    PTSD per patient  . Celiac disease   . Cervical neck pain with evidence of disc disease    patient has a cyst   . Degenerative disc disease at L5-S1 level   . Family history of adverse reaction to anesthesia    family has problems with anesthesia of nausea and vomiting   . Gluten enteropathy   . H/O parotitis    right   . H/O protein C deficiency    Dr. Anne Fu  . History of kidney stones   . Hx-TIA (transient ischemic attack)    2015  . Neck pain   . Neuromuscular disorder ([place])    bilateral neuropathy feet.  . Polycythemia, secondary   . PONV (postoperative nausea and vomiting)   . Psoriasis   . psoriatic arthritis   . Sleep apnea    cpap use- Dr. [name] follows  . Stroke [facility])    Stroke left side -slight right sided weakness-Dr. [name] follows  . 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 Tachycardia    hx of   . Transfusion history    past history- none recent  . Transgendered    There were no vitals taken for this visit.  Opioid Risk Score:   Fall Risk Score:  `1  Depression screen PHQ 2/9  Depression screen [facility] 2/9 09/23/2017 08/07/2017 06/24/2017 02/18/2017 11/10/2016 09/15/2016    Decreased Interest 0 3 0 1 0 0  Down, Depressed, Hopeless 0 3 0 1 0 0  PHQ - 2 Score 0 6 0 2 0 0  Altered sleeping - - 0 - - -  Tired, decreased energy - - 0 - - -  Change in appetite - - 0 - - -  Feeling bad  or failure about yourself  - - 0 - - -  Trouble concentrating - - 0 - - -  Moving slowly or fidgety/restless - - 0 - - -  Suicidal thoughts - - 0 - - -  PHQ-9 Score - - 0 - - -  Some recent data might be hidden     Review of Systems  Constitutional: Negative.   HENT: Negative.   Eyes: Negative.   Respiratory: Negative.   Cardiovascular: Negative.   Gastrointestinal: Negative.   Endocrine: Negative.   Genitourinary: Negative.   Musculoskeletal: Positive for arthralgias, gait problem and myalgias.  Skin: Negative.   Allergic/Immunologic: Negative.   Neurological: Positive for tremors.  Hematological: Negative.   Psychiatric/Behavioral: Negative.   All other systems reviewed and are negative.      Objective:   Physical Exam Constitutional: No distress . Vital signs reviewed. HEENT: EOMI, oral membranes moist Neck: supple Cardiovascular: RRR without murmur. No JVD    Respiratory: CTA Bilaterally without wheezes or rales. Normal effort    GI: BS +, non-tender, non-distended  Skin:Clean and intact without signs of breakdown. Left TKA scar.  Neuro:Pt is pleasant and alert and oriented x 3. Minimalright facial weakness and sensory loss is appreciated. UE motor:   4 to 4+5 LUE .RUE: 4- to 4+deltoid, bicep, tricep, wrist and grip as well. RLE: 3+ to 4-/5hf, ke and 2/5  ankle pf/df. LLE: 3+to 4-/5 prox to 2/5 ADF/PF. Reflexes remain hyperactive.sensory exam with patchy sensory loss in both arms and legs (inconsistent)   Musculoskeletal:mild pain with bilateral shoulder IR/ER but has functional use of both limbs.  Psych:flat, anxious   Assessment & Plan:  1. Psoriatic arthritis with pain in multiple areas, most prominently feet, hands,  elbows.Hispain is also related to his prior CVA and associated motor/sensory changepatient's complaining of increased s/spasticity. 2. 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  3. Hx of left total knee revisionagain on 04/21/17 per [facility] 4. Chronic low back pain---MRI with severe DDD at L5-S1. Left S1 radiculopathy? S/p LEFT L5-S1 ESI 5. Protein C deficiency  6. Left shoulder subluxation, bicipital tendonitis  7. Central sleep apnea  8. Polycythemia  9. Depression with anxiety. 10. Left lateral epidondylitis 11. 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 Most recent cervical MRI (07/08/17) without significant change  Plan:  1.Resume effexor asap. I asked pt to contact me if he runs into further difficulties receiving the medication.  2.MaintainHEPas possible. Discussed alternatives for braces. ?lateral support leftAFO. Discussed brace options at length.  Suspect he pronates when he bears weight on left side. I would be happy to work with [employer] re: AFO.   -we need to get [place] back up and moving again. His lack of exercise and activity has a lot to do with his back pain 3.Voltaren gel to hands, feet, knees, elbows 4.Baclofen for lower extermity spasms/back pain.20 mg 4 times daily #120.I discussed with him that the increase in baclofen could increase the perception of weakness although this would not cause sensory changes. 5. Refilled kadian 72m q12 #60and oxycodone 135m#90today.We will continue the controlled substance monitoring program, this consists of regular clinic visits, examinations, routine drug screening, pill counts as well [place].    6.will not continue pamelor due to dry mouth. 7.Has had good results with left L5-S1  translaminar injection per Dr. Lorene Dy performing a 3rd injection so soon. (see #2)   25 minutes of face to face patient care time were spent during this visit.follow-up with me in about 2 month's time.All questions were encouraged and answered. .Greater than 50% of time during this encounter was spent counseling patient/family in regard to review of recent imaging, discussion of treatment of options for brace wear, reviewing mechanics, etc.

## 2017-10-14 NOTE — Patient Instructions (Signed)
PLEASE FEEL FREE TO CALL OUR OFFICE WITH ANY PROBLEMS OR QUESTIONS ([phone])      

## 2017-10-14 NOTE — Telephone Encounter (Signed)
Patient returned Bruce's call.

## 2017-10-15 ENCOUNTER — Ambulatory Visit: Payer: Self-pay | Admitting: Psychology

## 2017-10-27 ENCOUNTER — Ambulatory Visit (INDEPENDENT_AMBULATORY_CARE_PROVIDER_SITE_OTHER): Payer: BLUE CROSS/BLUE SHIELD | Admitting: Psychology

## 2017-10-27 DIAGNOSIS — F4323 Adjustment disorder with mixed anxiety and depressed mood: Secondary | ICD-10-CM | POA: Diagnosis not present

## 2017-10-28 ENCOUNTER — Encounter: Payer: Self-pay | Admitting: Family Medicine

## 2017-10-29 ENCOUNTER — Ambulatory Visit (INDEPENDENT_AMBULATORY_CARE_PROVIDER_SITE_OTHER): Payer: BLUE CROSS/BLUE SHIELD | Admitting: Psychology

## 2017-10-29 DIAGNOSIS — F4310 Post-traumatic stress disorder, unspecified: Secondary | ICD-10-CM

## 2017-10-30 ENCOUNTER — Encounter: Payer: Self-pay | Admitting: Family Medicine

## 2017-10-30 DIAGNOSIS — M79641 Pain in right hand: Secondary | ICD-10-CM | POA: Insufficient documentation

## 2017-10-30 DIAGNOSIS — M79642 Pain in left hand: Secondary | ICD-10-CM

## 2017-11-03 ENCOUNTER — Ambulatory Visit (INDEPENDENT_AMBULATORY_CARE_PROVIDER_SITE_OTHER): Payer: BLUE CROSS/BLUE SHIELD | Admitting: Psychology

## 2017-11-03 DIAGNOSIS — F4312 Post-traumatic stress disorder, chronic: Secondary | ICD-10-CM

## 2017-11-04 ENCOUNTER — Telehealth: Payer: Self-pay | Admitting: Neurology

## 2017-11-04 NOTE — Telephone Encounter (Signed)
Pt has called stating he went to eye doctor today and was advised to contact his Neurologist to try and be seen because of eye doctor feeling pt has mad mini strokes and small TIA's.  Pt was asked if he was not advised/encouraged to go to ED.  pt states no because of condition pt has and there likely being in the past.  Pt also states he isn't always aware when he has one and when he has not.  Please call

## 2017-11-05 ENCOUNTER — Other Ambulatory Visit: Payer: Self-pay | Admitting: Family Medicine

## 2017-11-05 ENCOUNTER — Ambulatory Visit (INDEPENDENT_AMBULATORY_CARE_PROVIDER_SITE_OTHER): Payer: BLUE CROSS/BLUE SHIELD | Admitting: Psychology

## 2017-11-05 DIAGNOSIS — F4310 Post-traumatic stress disorder, unspecified: Secondary | ICD-10-CM | POA: Diagnosis not present

## 2017-11-05 NOTE — Telephone Encounter (Signed)
Refill of testosterone by historical provider  LOV 09/23/17 Dr. Brigitte Pulse  [facility] 04/06/15  10cc  0 refills  Pt has appt 12/25/17 with Dr. Brigitte Pulse  [id number] [place] [address]

## 2017-11-05 NOTE — Telephone Encounter (Signed)
Copied from [place] [id number]. Topic: Quick Communication - Rx Refill/Question >> Nov 05, 2017  4:39 PM Selinda Flavin B, [place] wrote: Medication: testosterone cypionate (DEPO-TESTOSTERONE) 200 MG/ML injection   Has the patient contacted their pharmacy? No.  (Agent: If no, request that the patient contact the pharmacy for the refill.) (Agent: If yes, when and what did the pharmacy advise?)  Preferred Pharmacy (with phone number or street name): [id number] [address] Agent: Please be advised that RX refills may take up to 3 business days. We ask that you follow-up with your pharmacy.

## 2017-11-05 NOTE — Telephone Encounter (Signed)
Noted/fim 

## 2017-11-05 NOTE — Telephone Encounter (Signed)
FYI Pt has called back and accepted 11-12-2017, he is aware to check in at 2:30 for the 3:00 appointment.  No call back requested

## 2017-11-05 NOTE — Telephone Encounter (Signed)
Pt was called, voicemail was left informing pt he needs to call to be scheduled for a f/u with Dr [name].  [facility] telephone # was left for pt to call.

## 2017-11-12 ENCOUNTER — Other Ambulatory Visit: Payer: Self-pay

## 2017-11-12 ENCOUNTER — Ambulatory Visit (INDEPENDENT_AMBULATORY_CARE_PROVIDER_SITE_OTHER): Payer: BLUE CROSS/BLUE SHIELD | Admitting: Neurology

## 2017-11-12 ENCOUNTER — Telehealth: Payer: Self-pay | Admitting: Neurology

## 2017-11-12 ENCOUNTER — Encounter: Payer: Self-pay | Admitting: Neurology

## 2017-11-12 VITALS — BP 138/90 | HR 98 | Resp 16 | Ht 69.0 in | Wt 219.8 lb

## 2017-11-12 DIAGNOSIS — G950 Syringomyelia and syringobulbia: Secondary | ICD-10-CM

## 2017-11-12 DIAGNOSIS — Z789 Other specified health status: Secondary | ICD-10-CM

## 2017-11-12 DIAGNOSIS — H539 Unspecified visual disturbance: Secondary | ICD-10-CM | POA: Insufficient documentation

## 2017-11-12 DIAGNOSIS — F640 Transsexualism: Secondary | ICD-10-CM

## 2017-11-12 DIAGNOSIS — G4733 Obstructive sleep apnea (adult) (pediatric): Secondary | ICD-10-CM

## 2017-11-12 DIAGNOSIS — G939 Disorder of brain, unspecified: Secondary | ICD-10-CM

## 2017-11-12 NOTE — Progress Notes (Signed)
[facility]  PATIENT: Jesse Bowers DOB: [birth year]     HISTORICAL  CHIEF COMPLAINT:  Chief Complaint  Patient presents with  . MRI    Would like to discuss MRI results--sts. Optomotrist [facility]) referred him to discuss results/fim    HISTORY OF PRESENT ILLNESS:  Jesse Bowers is a 52 y.o. transgender male  Update 11/12/2017: He was having difficulties with visual changes and flashing lights in the visual field.  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   He reports a torn retina on the right.  Apparently, some ischemic changes were seen.   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  He reports more difficulty with gait due to balance and knee orthopedic issues.  Also has numbness in the hands, right greater than left and the upper back.  He uses an Clinical research associate.  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   Previous brain MRIs have shown small round subcortical T2/FLAIR hypertense foci.  These are nonspecific and could be due to chronic microvascular ischemic changes.  There are no lacunar or large vessel strokes.  MRI images were personally reviewed.  Additionally lab work and cardiology studies were also reviewed.  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  MRI brain shows some scattered subcortical white matter foci likely due to migraine or mild chronic microvascular ischemic change.   MRI lumbar 04/16/2016 shows DDD/DJD at L5-S1 with mild foraminal narrowing and small left L3-L4 disc protrusion.   Echo 12/15/2016 did not show any valvular problem or PFO/ASD, EF% was 60-65%.  He denies h/o smoking, HTN, DM.  He has OSA diagnosed in 2015 and has been on CPAP since 2017 and uses it every night.    He no longer has the chip.        From 10/02/2015:   Syrinx/numbness:   He reports tingling in his hands, left more  than right.  Also has numbness in a acapelike distribution of the back.   Laying flat increases hand numbness.    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   He reports a lot of lot of numbness in the back and arms. Ortho has told him he has subluxation at the elbow affecting his left ulnar nerve.     He also states he has woken up with severe arm weakness some mornings.    He reports a lot of pain as well as the numbness, mostly in the back.  Memory/Psych:   He reports short term memory issues.       He notes a lot of difficulty focusing.   Dr [name] evaluated (report 01/05/15 in EPIC was reviewed).  His summary reports decreased mental processing speed, verbal fluency, mental flexibility and bimanual motor speed     He sees Dr. Erling Cruz at [facility] for PTSD and anxiety.     Night time cyclobenzapine has helped his sleep and mood related issues.    OSA:  He has OSA with central apneas and is now on ASV mode (EPAP 7 cm, PS 4-15) and has trouble wearing the mask the entire night.  He was on BiPAP but still had OSA.   Mask does not leak too much and is better than prior mask.      I reviewed his download. It shows use most days though he did not use it 1 week during  his sinus surgery. Efficacy was excellent with an AHI equals 1.8.  Olfactory Hallucinations:   These are better, none recently.    He feels better since sinus surgery  Chronic pain:   He sees Dr. Ephriam Knuckles (PMR).   He has chronic pain helped a lot by a numbing ointment and Morphine.    He also reports his right foot turns in as he walks and PMR is setting him up for an AFO.    He is on Cosyntex for psoriatic arthritis.   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    His MRI brain 2015 showed mild predominantly subcortical hyperintense foci that are non-specific.  A relationship to the Prt C deficiency is possible.     EPWORTH SLEEPINESS SCALE  On a scale of 0 - 3 what is the  chance of dozing:  Sitting and Reading:   2 Watching TV:    3 Sitting inactive in a public place: 0 Passenger in car for one hour: 3 Lying down to rest in the afternoon: 3 Sitting and talking to someone: 0 Sitting quietly after lunch:  1 In a car, stopped in traffic:  0  Total (out of 24):    12   (mild to moderate EDS)   REVIEW OF SYSTEMS:  Constitutional: Has fatigue.  See above.   No fevers, chills, sweats, or change in appetite Eyes: Reports some episodes of visual blurring.  There is no eye pain.  Ear, nose and throat: No hearing loss, ear pain, nasal congestion, sore throat Cardiovascular: No chest pain.   He notes occ. palpitations Respiratory:  No shortness of breath at rest or with exertion.   No wheezes GastrointestinaI: No nausea, vomiting, diarrhea, abdominal pain, fecal incontinence Genitourinary:  No dysuria, urinary retention or frequency.  No nocturia. Musculoskeletal:  He has psoriatic arthritis with joint pain/swelling.  On high-dose opiates Integumentary:has psoriasis Neurological: as above Psychiatric: History of depression, anxiety and PTSD.   Endocrine: He is transgender and on chronic testosterone therapy.   Reports heat intolerance.  No palpitations, diaphoresis, change in appetite, change in weigh or increased thirst Hematologic/Lymphatic:  No anemia, purpura, petechiae. Allergic/Immunologic: No itchy/runny eyes, nasal congestion, recent allergic reactions, rashes  ALLERGIES: Allergies  Allergen Reactions  . Penicillin G Anaphylaxis and Other (See Comments)    Has patient had a PCN reaction causing immediate rash, facial/tongue/throat swelling, SOB or lightheadedness with hypotension: Yes Has patient had a PCN reaction causing severe rash involving mucus membranes or skin necrosis: No Has patient had a PCN reaction that required hospitalization Yes Has patient had a PCN reaction occurring within the last 10 years: No If all of the above answers are "NO",  then may proceed with Cephalosporin use.   . Vancomycin Rash and Other (See Comments)    RED MAN SYNDROME CAN HAVE IF GIVEN OVER 2HOURS  . Duloxetine Other (See Comments)    Restless legs  . Gabapentin Nausea Only and Other (See Comments)    Other reaction(s): nausea, mental status  . Tegaderm Ag Mesh [Silver] Other (See Comments)    Causes blistering wounds   . Acetaminophen Other (See Comments)    Elevates liver enzymes.   . Ibuprofen Other (See Comments)    Contraindicated with Xarelto.   . Nortriptyline Other (See Comments)    Dry mouth at 25 mg dose.  Tolerates 10 mg dose  .  Pregabalin Other (See Comments)    Ineffective  . Sulfa Antibiotics Rash and Other (See Comments)    Stevens-Johnson rash  . Sulfacetamide Sodium-Sulfur Rash    HOME MEDICATIONS: Outpatient Medications Prior to Visit  Medication Sig Dispense Refill  . baclofen (LIORESAL) 20 MG tablet Take 1 tablet (20 mg total) by mouth 4 (four) times daily. 120 tablet 3  . betamethasone dipropionate (DIPROLENE) 0.05 % cream Apply topically 2 (two) times daily.    . Calcipotriene-Betameth Diprop (ENSTILAR) 0.005-0.064 % FOAM Apply 1 application topically 2 (two) times daily.     . clorazepate (TRANXENE) 15 MG tablet Take 15 mg by mouth at bedtime.    . clorazepate (TRANXENE) 7.5 MG tablet Take 7.5 mg by mouth 2 (two) times daily.     [name] desonide (DESOWEN) 0.05 % ointment APPLY TO AFFECTED AREAS TWICE DAILY AS NEEDED FOR PSORIASIS.  2  . furosemide (LASIX) 40 MG tablet Take 1 tablet (40 mg total) by mouth daily. Take 1 extra tab daily prn for a weight gain of 3lbs in a day or 5 lbs per week. 180 tablet 0  . morphine (KADIAN) 60 MG 24 hr capsule Take 1 capsule (60 mg total) by mouth every 12 (twelve) hours. Do Not Fill Before 11/12/17 60 capsule 0  . neomycin-polymyxin-hydrocortisone (CORTISPORIN) 3.5-10000-1 OTIC suspension PLACE 4 DROPS INTO LEFT EAR TWICE A DAY AS NEEDED FOR PAIN AND DRAINAGE  1  .  neomycin-polymyxin-hydrocortisone (CORTISPORIN) OTIC solution 4 (four) times daily.    . NONFORMULARY OR COMPOUNDED ITEM Apply 1 application topically 3 (three) times daily. 8% ketamine, 5% amitriptyline, 5% baclofen, 5% gabapentin  60 GM (Patient taking differently: Apply 1 application topically 3 (three) times daily as needed (pain). 8% ketamine, 5% amitriptyline, 5% baclofen, 5% gabapentin  60 GM) 1 each 5  . Oxycodone HCl 10 MG TABS Take 1 tablet (10 mg total) by mouth every 8 (eight) hours as needed ((score 7 to 10)). Do Not Fill Before 11/12/17 90 tablet 0  . potassium chloride (K-DUR) 10 MEQ tablet Take 1 tablet (10 mEq total) by mouth 2 (two) times daily. 180 tablet 2  . prazosin (MINIPRESS) 1 MG capsule Take 1 mg by mouth at bedtime. Takes 1 mg and 2 mg to equal 3 mg at bedtime    . prazosin (MINIPRESS) 2 MG capsule Take 2 mg by mouth at bedtime. Takes 1 mg and 2 mg to equal 3 mg at bedtime    . rivaroxaban (XARELTO) 20 MG TABS tablet TAKE 1 TABLET DAILY BEFORE BEDTIME 90 tablet 1  . Secukinumab (COSENTYX [place]) Inject into the skin.    [name] testosterone cypionate (DEPO-TESTOSTERONE) 200 MG/ML injection Inject 0.25 mLs (50 mg total) into the muscle once a week. (Patient taking differently: Inject 100 mg into the muscle once a week. Friday) 10 mL 0  . ursodiol (ACTIGALL) 500 MG tablet Take 500 mg by mouth 3 (three) times daily.    [name] venlafaxine XR (EFFEXOR-XR) 150 MG 24 hr capsule Take 2 capsules (300 mg total) by mouth daily with breakfast. 60 capsule 5  . Ursodiol (ACTIGALL PO) Take by mouth.     No facility-administered medications prior to visit.     PAST MEDICAL HISTORY: Past Medical History:  Diagnosis Date  . Abnormal weight loss   . Anxiety    PTSD per patient  . Celiac disease   . Cervical neck pain with evidence of disc disease    patient has a cyst   .  Degenerative disc disease at L5-S1 level   . Family history of adverse reaction to anesthesia    family has problems with  anesthesia of nausea and vomiting   . Gluten enteropathy   . H/O parotitis    right   . H/O protein C deficiency    Dr. Anne Fu  . History of kidney stones   . Hx-TIA (transient ischemic attack)    2015  . Neck pain   . Neuromuscular disorder ([place])    bilateral neuropathy feet.  . Polycythemia, secondary   . PONV (postoperative nausea and vomiting)   . Psoriasis   . psoriatic arthritis   . Sleep apnea    cpap use- Dr. [name] follows  . Stroke [facility])    Stroke left side -slight right sided weakness-Dr. [name] follows  . Syrinx of spinal cord ([place]) 01/06/2014   c spine on MRI  . Tachycardia    hx of   . Transfusion history    past history- none recent  . Transgendered     PAST SURGICAL HISTORY: Past Surgical History:  Procedure Laterality Date  . ABDOMINAL HYSTERECTOMY     TAH, BSO- tranverse incision.  [name] ANKLE ARTHROSCOPY WITH RECONSTRUCTION Right 2007  . CHOLECYSTECTOMY     laparoscopic  . HIP ARTHROSCOPY W/ LABRAL REPAIR Right 05/11/2013  . KNEE SURGERY Bilateral 1984   Right ACL, left PCL repair  . LIVER BIOPSY  2013   normal results.  [name] NASAL SEPTUM SURGERY N/A 09/20/2015  . THYROIDECTOMY, PARTIAL  2008  . TOTAL KNEE REVISION Left 02/06/2016   Procedure: LEFT TOTAL KNEE REVISION;  Surgeon: [name], MD;  Location: [facility];  Service: Orthopedics;  Laterality: Left;  . TOTAL KNEE REVISION Left 04/22/2017   Procedure: Left knee polyethylene revision;  Surgeon: [name], MD;  Location: [facility];  Service: Orthopedics;  Laterality: Left;    FAMILY HISTORY: Family History  Problem Relation Age of Onset  . Stroke Maternal Grandfather        65  . Heart attack Maternal Grandfather   . Hypertension Mother   . Psoriasis Mother   . Cancer Paternal Grandfather   . Heart attack Paternal Grandfather   . Stroke Paternal Uncle        age 83  . Stroke Maternal Grandmother   . Congestive Heart Failure Maternal Grandmother   . Heart attack Maternal Grandmother     . Protein C deficiency Sister 64       Miscarriages    SOCIAL HISTORY:  Social History   Socioeconomic History  . Marital status: Married    Spouse name: Not on file  . Number of children: 2  . Years of education: 4y college  . Highest education level: Not on file  Occupational History  . Occupation: Pediatric Nurse practitioner    Comment: Not working since [place] 2015  Social Needs  . Financial resource strain: Not on file  . Food insecurity:    Worry: Not on file    Inability: Not on file  . Transportation needs:    Medical: Not on file    Non-medical: Not on file  Tobacco Use  . Smoking status: Never Smoker  . Smokeless tobacco: Never Used  Substance and Sexual Activity  . Alcohol use: No    Frequency: Never  . Drug use: No  . Sexual activity: Yes    Birth control/protection: None    Comment: patient is a transgender on testosterone shots, no biological kids  Lifestyle  . Physical activity:    Days per week: Not on file    Minutes per session: Not on file  . Stress: Not on file  Relationships  . Social connections:    Talks on phone: Not on file    Gets together: Not on file    Attends religious service: Not on file    Active member of club or organization: Not on file    Attends meetings of clubs or organizations: Not on file    Relationship status: Not on file  . Intimate partner violence:    Fear of current or ex partner: Not on file    Emotionally abused: Not on file    Physically abused: Not on file    Forced sexual activity: Not on file  Other Topics Concern  . Not on file  Social History Narrative   Education 4 year college, former Therapist, sports X 15 years, pediatric nurse practitioner x 6 years, did NP degree from [employer] [place]. Relocated to [place] about 2 months ago from [place], [place]. Patient was in [place] for last 4 years and prior to that in [place]. His wife is working as Scientist, research (physical sciences) for [employer]. Patient is not working and applying for  disability. They have 2 kids but no biologic children.      PHYSICAL EXAM  Vitals:   11/12/17 1503  BP: 138/90  Pulse: 98  Resp: 16  Weight: 219 lb 12.8 oz (99.7 kg)  Height: 5' 9"  (1.753 m)    Body mass index is 32.46 kg/m.   General: The patient is well-developed and well-nourished and in no acute distress  HEENT: Head is normocephalic and atraumatic.  Funduscopic examination shows normal optic disks and retinal vessels..   Throat:   No erythema.  No elongation of palate  Neck: There are no bruits.  The neck is nontender.   Cardiovascular: The cardiovascular examination reveals a regular rate and rhythm, no murmurs, gallops or rubs are noted.  Skin: Extremities are without significant edema or rash.   Has left TKR scar  Neurologic Exam  Mental status: The patient is alert and oriented x 3 at the time of the examination. He answers questions appropriately.   The patient has apparent normal recent and remote memory, with an apparently normal attention span and concentration ability.   Speech is normal.  Cranial nerves: Extraocular movements are full.  Facial strength is normal..He reports decreased right facial sensation.     Trapezius and sternocleidomastoid strength is normal. No dysarthria is noted.  The tongue is midline, and the patient has symmetric elevation of the soft palate. No obvious hearing deficits are noted.  Motor:  Muscle bulk and tone are normal. Strength is  5 / 5 in all 4 extremities.   Sensory: He reports reduced sensation to touch in the right arm relative to the left.  More symmetric sensation in the legs..  Coordination: Cerebellar testing reveals good finger-nose-finger bilaterally.  Gait and station: Station is normal.   PR inverted with walking and tandem gait is wide.  Reflexes: Deep tendon reflexes are symmetric and normal bilaterally.    DIAGNOSTIC DATA (LABS, IMAGING, TESTING) - I reviewed patient records, labs, notes, testing and  imaging myself where available.  Lab Results  Component Value Date   WBC 3.7 09/09/2017   HGB 12.5 (L) 09/09/2017   HCT 42.0 09/09/2017   MCV 76 (L) 09/09/2017   PLT 248 09/09/2017    Lab Results  Component Value  Date   VITAMINB12 384 09/23/2017   Lab Results  Component Value Date   TSH 4.980 (H) 03/17/2017        ASSESSMENT AND PLAN  Transient vision disturbance - Plan: US Carotid Bilateral, MR BRAIN WO CONTRAST  Chronic non-specific white matter lesions on MRI - Plan: MR BRAIN WO CONTRAST  OSA (obstructive sleep apnea)  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[place])  Transgender  1.   Due to fluctuating visual symptoms.  We will check MRI of the brain without contrast and compared with the previous one to determine if there has been significant changes.  Additionally we will check a carotid Doppler study. 2.   Continue BiPAP with ASV mode for complex OSA 3.   Xarelto for protein S deficiency..   4.   We will call with the results of the study.  He will return to see me in 3-4 months or sooner if new or worsening neurologic symptoms.   Richard A. [name], MD, PhD 10/10/9831, 8:25 PM Certified in Neurology, Clinical Neurophysiology, Sleep Medicine, Pain Medicine and Neuroimaging  [facility] [address], [place], [place] [address] [phone]

## 2017-11-12 NOTE — Telephone Encounter (Signed)
self pay order sent to GI. theyw ill reach out to the pt to schedule.

## 2017-11-13 ENCOUNTER — Telehealth: Payer: Self-pay | Admitting: Family Medicine

## 2017-11-13 DIAGNOSIS — N3943 Post-void dribbling: Secondary | ICD-10-CM

## 2017-11-13 DIAGNOSIS — R39198 Other difficulties with micturition: Secondary | ICD-10-CM

## 2017-11-13 DIAGNOSIS — D508 Other iron deficiency anemias: Secondary | ICD-10-CM

## 2017-11-13 DIAGNOSIS — Z789 Other specified health status: Secondary | ICD-10-CM

## 2017-11-13 DIAGNOSIS — Q6211 Congenital occlusion of ureteropelvic junction: Secondary | ICD-10-CM

## 2017-11-13 DIAGNOSIS — R109 Unspecified abdominal pain: Secondary | ICD-10-CM

## 2017-11-13 DIAGNOSIS — R399 Unspecified symptoms and signs involving the genitourinary system: Secondary | ICD-10-CM

## 2017-11-13 DIAGNOSIS — R3911 Hesitancy of micturition: Secondary | ICD-10-CM

## 2017-11-13 DIAGNOSIS — F640 Transsexualism: Secondary | ICD-10-CM

## 2017-11-13 NOTE — Telephone Encounter (Signed)
Called patient to reschedule his 12/25/2017 appointment with Dr Brigitte Pulse which was rescheduled to 01/08/2018.  In the process of rescheduling his appointment, he asked for a referral to the following Urologist:  [facility], Dr Hollice Espy; [phone]; [address] [place], [place] [address].  Mr Jesse Bowers said if you have any questions, you can call him at  [phone]

## 2017-11-16 ENCOUNTER — Other Ambulatory Visit: Payer: Self-pay | Admitting: *Deleted

## 2017-11-16 DIAGNOSIS — H539 Unspecified visual disturbance: Secondary | ICD-10-CM

## 2017-11-17 DIAGNOSIS — M6281 Muscle weakness (generalized): Secondary | ICD-10-CM | POA: Insufficient documentation

## 2017-11-18 NOTE — Telephone Encounter (Signed)
Please see note below and advise  

## 2017-11-23 ENCOUNTER — Encounter (HOSPITAL_COMMUNITY): Payer: Self-pay

## 2017-11-24 ENCOUNTER — Ambulatory Visit: Payer: Self-pay | Admitting: Psychology

## 2017-11-25 ENCOUNTER — Encounter: Payer: Self-pay | Admitting: Family Medicine

## 2017-11-26 ENCOUNTER — Ambulatory Visit (INDEPENDENT_AMBULATORY_CARE_PROVIDER_SITE_OTHER): Payer: BLUE CROSS/BLUE SHIELD | Admitting: Psychology

## 2017-11-26 DIAGNOSIS — F4310 Post-traumatic stress disorder, unspecified: Secondary | ICD-10-CM | POA: Diagnosis not present

## 2017-11-30 ENCOUNTER — Telehealth: Payer: Self-pay

## 2017-11-30 DIAGNOSIS — M189 Osteoarthritis of first carpometacarpal joint, unspecified: Secondary | ICD-10-CM | POA: Insufficient documentation

## 2017-11-30 DIAGNOSIS — L4050 Arthropathic psoriasis, unspecified: Secondary | ICD-10-CM

## 2017-11-30 DIAGNOSIS — M5416 Radiculopathy, lumbar region: Secondary | ICD-10-CM

## 2017-11-30 NOTE — Telephone Encounter (Signed)
Pt has a morphine rx with [facility] before date of 11/12/17 (PLEASE SEE MED LIST)

## 2017-11-30 NOTE — Telephone Encounter (Signed)
Pt called requesting a refill on Morphine, last filled #60 on 11/02/17 and Oxycodone #90 on 10/14/17. Next appt 12/14/17.

## 2017-11-30 NOTE — Telephone Encounter (Signed)
Contacted pharmacy, contacted patient.  Medication will be filled tomorrow and ready for pick up

## 2017-12-03 ENCOUNTER — Encounter: Payer: Self-pay | Admitting: Family Medicine

## 2017-12-03 ENCOUNTER — Telehealth: Payer: Self-pay

## 2017-12-03 ENCOUNTER — Ambulatory Visit (INDEPENDENT_AMBULATORY_CARE_PROVIDER_SITE_OTHER): Payer: BLUE CROSS/BLUE SHIELD | Admitting: Psychology

## 2017-12-03 DIAGNOSIS — F4310 Post-traumatic stress disorder, unspecified: Secondary | ICD-10-CM

## 2017-12-03 DIAGNOSIS — R39198 Other difficulties with micturition: Secondary | ICD-10-CM | POA: Insufficient documentation

## 2017-12-03 MED ORDER — TESTOSTERONE CYPIONATE 200 MG/ML IM SOLN: 100.0000 mg | INTRAMUSCULAR | 0 refills | Status: DC

## 2017-12-03 MED ORDER — TESTOSTERONE CYPIONATE 200 MG/ML IM SOLN: 90.0000 mg | INTRAMUSCULAR | 0 refills | Status: DC

## 2017-12-03 MED ORDER — SYRINGE (DISPOSABLE) 1 ML MISC: 1.0000 [IU] | 2 refills | Status: DC

## 2017-12-03 MED ORDER — "NEEDLE (DISP) 18G X 1-1/2"" MISC": 1.0000 [IU] | 2 refills | Status: DC

## 2017-12-03 MED ORDER — "NEEDLE (DISP) 23G X 3/4"" MISC": 1.0000 [IU] | 1 refills | Status: DC

## 2017-12-03 NOTE — Telephone Encounter (Signed)
Refill sent in

## 2017-12-03 NOTE — Telephone Encounter (Signed)
Sent pt MyChart note

## 2017-12-03 NOTE — Telephone Encounter (Addendum)
Placed referral.  Pt sent [place] email noting: L kidney pain x mos, trouble starting, frequency issues, and residual urine after I'm done is newest sx. Enough to wet my underoos~30cc.  It's VERY difficult to start flow & maintain flow.   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 I have L5-S2 disc L stenosis.

## 2017-12-03 NOTE — Addendum Note (Signed)
Addended by: Shawnee Knapp on: 12/03/2017 04:28 AM   Modules accepted: Orders

## 2017-12-03 NOTE — Telephone Encounter (Signed)
Faxed preoperative risk evaluation to [facility] to Santiago Bur regarding Xarelto.

## 2017-12-04 ENCOUNTER — Encounter: Payer: Self-pay | Admitting: Family Medicine

## 2017-12-04 ENCOUNTER — Telehealth: Payer: Self-pay | Admitting: Family Medicine

## 2017-12-04 DIAGNOSIS — H905 Unspecified sensorineural hearing loss: Secondary | ICD-10-CM | POA: Insufficient documentation

## 2017-12-04 NOTE — Telephone Encounter (Signed)
Patients RX for testosterone has been approved. He can pick up at pharmacy.

## 2017-12-07 ENCOUNTER — Encounter: Payer: Self-pay | Admitting: Urology

## 2017-12-07 ENCOUNTER — Ambulatory Visit (INDEPENDENT_AMBULATORY_CARE_PROVIDER_SITE_OTHER): Payer: BLUE CROSS/BLUE SHIELD | Admitting: Urology

## 2017-12-07 VITALS — BP 143/96 | HR 106 | Ht 69.0 in | Wt 222.4 lb

## 2017-12-07 DIAGNOSIS — N135 Crossing vessel and stricture of ureter without hydronephrosis: Secondary | ICD-10-CM | POA: Diagnosis not present

## 2017-12-07 DIAGNOSIS — R350 Frequency of micturition: Secondary | ICD-10-CM

## 2017-12-07 DIAGNOSIS — N3946 Mixed incontinence: Secondary | ICD-10-CM

## 2017-12-07 DIAGNOSIS — R3911 Hesitancy of micturition: Secondary | ICD-10-CM | POA: Diagnosis not present

## 2017-12-07 LAB — URINALYSIS, COMPLETE
Bilirubin, UA: NEGATIVE
GLUCOSE, UA: NEGATIVE
KETONES UA: NEGATIVE
LEUKOCYTES UA: NEGATIVE
Nitrite, UA: NEGATIVE
Protein, UA: NEGATIVE
RBC, UA: NEGATIVE
Specific Gravity, UA: 1.015 (ref 1.005–1.030)
Urobilinogen, Ur: 0.2 mg/dL (ref 0.2–1.0)
pH, UA: 7 (ref 5.0–7.5)

## 2017-12-07 LAB — BLADDER SCAN AMB NON-IMAGING

## 2017-12-07 MED ORDER — MIRABEGRON ER 25 MG PO TB24: 25.0000 mg | ORAL_TABLET | Freq: Every day | ORAL | 0 refills | Status: DC

## 2017-12-07 NOTE — Progress Notes (Signed)
12/07/2017 12:42 PM   Jesse Bowers [birth year] [id number]  Referring provider: Concepcion Elk, MD [address] [place], [place] [address]  Chief Complaint  Patient presents with  . Urinary Frequency    New Patient    HPI: Patient is a 52 year old transgender male (male to male) with a history of mild left hydronephrosis who is referred by Dr. Delman Bowers for hesitancy, frequency and incontinence.  Contrast CT in 2017 noted mild chronic hydronephrosis.  He is having flank pain at times.   Today, he complains of frequency x 10-15, strong urgency (he had the urge to void three times during the visit), nocturia x 1-3 (sometimes take lasix in the evening and has sleep apnea and sleeps with BiPAP), incontinence and hesitancy.  These symptoms started 6 months ago.  Patient denies any gross hematuria, dysuria or suprapubic/flank pain.  Patient denies any fevers, chills, nausea or vomiting.  His UA is negative.  His PVR is 34 mL.    He is suffering from constipation.    He has a small can of diet Coke daily, otherwise mostly water.       PMH: Past Medical History:  Diagnosis Date  . Abnormal weight loss   . Anxiety    PTSD per patient  . Arthritis   . Celiac disease   . Cervical neck pain with evidence of disc disease    patient has a cyst   . Degenerative disc disease at L5-S1 level    with stenosis  . 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 Family history of adverse reaction to anesthesia    family has problems with anesthesia of nausea and vomiting   . 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 Gluten enteropathy   . H/O parotitis    right   . History of kidney stones   . Hx-TIA (transient ischemic attack)    2015  . Neck pain   . Neuromuscular disorder ([place])    bilateral neuropathy feet.  . Pneumonia 12/17/2010  . Polycythemia   . Polycythemia, secondary   . PONV (postoperative nausea and vomiting)   . Protein C deficiency ([place])    Dr. Anne Bowers  .  Psoriasis   . psoriatic arthritis   . Scaphoid fracture of wrist 09/23/2013  . Sleep apnea    split night study last done by Dr. [name] 06/18/15 shows severe OSA, CSA, and hypersomnia, rec bipap  . Stenosis of ureteropelvic junction (UPJ)    left  . 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 [name] follows  . Syrinx of spinal cord ([name]) 01/06/2014   c spine on MRI  . Tachycardia    hx of   . Transfusion history    past history- none recent    Surgical History: Past Surgical History:  Procedure Laterality Date  . ABDOMINAL HYSTERECTOMY Bilateral 1994   TAH, BSO- tranverse incision at 52 yo  . ANKLE ARTHROSCOPY WITH RECONSTRUCTION Right 2007  . CHOLECYSTECTOMY     laparoscopic  . HIP ARTHROSCOPY W/ LABRAL REPAIR Right 05/11/2013   acetabular labral tear 03/30/2013  . KNEE SURGERY Bilateral 1984   Right ACL, left PCL repair  . LITHOTRIPSY  2005  . LIVER BIOPSY  2013   normal results.  Jesse Bowers MASTECTOMY Bilateral    prior to 2009  . NASAL SEPTUM SURGERY N/A 09/20/2015   by ENT Dr. Lucia Bowers  . OVARIAN CYST SURGERY Left  size of grapefruit, was informed that she had shortened vagina  . THYROIDECTOMY, PARTIAL Left 2008  . TOTAL KNEE REVISION Left 02/06/2016   Procedure: LEFT TOTAL KNEE REVISION;  Surgeon: [name], MD;  Location: [facility];  Service: Orthopedics;  Laterality: Left;  . TOTAL KNEE REVISION Left 04/22/2017   Procedure: Left knee polyethylene revision;  Surgeon: [name], MD;  Location: [facility];  Service: Orthopedics;  Laterality: Left;  . UPPER GI ENDOSCOPY  2003    Home Medications:  Allergies as of 12/07/2017      Reactions   Penicillin G Anaphylaxis, Other (See Comments)   Has patient had a PCN reaction causing immediate rash, facial/tongue/throat swelling, SOB or lightheadedness with hypotension: Yes Has patient had a PCN reaction causing severe rash involving mucus membranes or skin necrosis: No Has patient had a PCN  reaction that required hospitalization Yes Has patient had a PCN reaction occurring within the last 10 years: No If all of the above answers are "NO", then may proceed with Cephalosporin use.   Sulfa Antibiotics Rash   Stevens-Johnson rash   Vancomycin Rash, Other (See Comments)   RED MAN SYNDROME CAN HAVE IF GIVEN OVER 2HOURS   Wheat Extract Other (See Comments)   CELIAC DISEASE CELIAC DISEASE   Duloxetine Other (See Comments)   Restless legs   Gabapentin Nausea Only, Other (See Comments)   Other reaction(s): nausea, mental status Drowsiness and restlessness.   Tegaderm Ag Mesh [silver] Dermatitis   Causes blistering wounds   Citalopram Other (See Comments)   Dystonia   Acetaminophen Other (See Comments)   Elevates liver enzymes.    Ibuprofen Other (See Comments)   Contraindicated with Xarelto.    Nortriptyline Other (See Comments)   Dry mouth at 25 mg dose.  Tolerates 10 mg dose   Pregabalin Other (See Comments)   Ineffective   Sulfacetamide Sodium-sulfur Rash      Medication List        Accurate as of 12/07/17 12:42 PM. Always use your most recent med list.          baclofen 20 MG tablet Commonly known as:  LIORESAL Take 1 tablet (20 mg total) by mouth 4 (four) times daily.   betamethasone dipropionate 0.05 % cream Commonly known as:  DIPROLENE Apply topically 2 (two) times daily.   clorazepate 7.5 MG tablet Commonly known as:  TRANXENE Take 7.5 mg by mouth 2 (two) times daily.   clorazepate 15 MG tablet Commonly known as:  TRANXENE Take 15 mg by mouth at bedtime.   COSENTYX [place] Inject into the skin.   desonide 0.05 % ointment Commonly known as:  DESOWEN APPLY TO AFFECTED AREAS TWICE DAILY AS NEEDED FOR PSORIASIS.   ENSTILAR 0.005-0.064 % Foam Generic drug:  Calcipotriene-Betameth Diprop Apply 1 application topically 2 (two) times daily.   furosemide 40 MG tablet Commonly known as:  LASIX Take 1 tablet (40 mg total) by mouth daily. Take 1 extra  tab daily prn for a weight gain of 3lbs in a day or 5 lbs per week.   mirabegron ER 25 MG Tb24 tablet Commonly known as:  MYRBETRIQ Take 1 tablet (25 mg total) by mouth daily.   morphine 60 MG 24 hr capsule Commonly known as:  KADIAN Take 1 capsule (60 mg total) by mouth every 12 (twelve) hours. Do Not Fill Before 11/12/17   NEEDLE (DISP) 18 G 18G X 1-1/2" Misc 1 Units by Does not apply route once a week.  NEEDLE (DISP) 23 G 23G X 3/4" Misc 1 Units by Does not apply route once a week.   neomycin-polymyxin-hydrocortisone 3.5-10000-1 OTIC suspension Commonly known as:  CORTISPORIN PLACE 4 DROPS INTO LEFT EAR TWICE A DAY AS NEEDED FOR PAIN AND DRAINAGE   NONFORMULARY OR COMPOUNDED ITEM Apply 1 application topically 3 (three) times daily. 8% ketamine, 5% amitriptyline, 5% baclofen, 5% gabapentin  60 GM   Oxycodone HCl 10 MG Tabs Take 1 tablet (10 mg total) by mouth every 8 (eight) hours as needed ((score 7 to 10)). Do Not Fill Before 11/12/17   potassium chloride 10 MEQ tablet Commonly known as:  K-DUR Take 1 tablet (10 mEq total) by mouth 2 (two) times daily.   prazosin 1 MG capsule Commonly known as:  MINIPRESS Take 1 mg by mouth at bedtime. Takes 1 mg and 2 mg to equal 3 mg at bedtime   prazosin 2 MG capsule Commonly known as:  MINIPRESS Take 2 mg by mouth at bedtime. Takes 1 mg and 2 mg to equal 3 mg at bedtime   rivaroxaban 20 MG Tabs tablet Commonly known as:  XARELTO TAKE 1 TABLET DAILY BEFORE BEDTIME   Syringe (Disposable) 1 ML Misc 1 Units by Does not apply route once a week.   testosterone cypionate 200 MG/ML injection Commonly known as:  DEPOTESTOSTERONE CYPIONATE Inject 0.5 mLs (100 mg total) into the muscle once a week.   ursodiol 500 MG tablet Commonly known as:  ACTIGALL Take 500 mg by mouth 3 (three) times daily.   venlafaxine XR 150 MG 24 hr capsule Commonly known as:  EFFEXOR-XR Take 2 capsules (300 mg total) by mouth daily with breakfast.        Allergies:  Allergies  Allergen Reactions  . Penicillin G Anaphylaxis and Other (See Comments)    Has patient had a PCN reaction causing immediate rash, facial/tongue/throat swelling, SOB or lightheadedness with hypotension: Yes Has patient had a PCN reaction causing severe rash involving mucus membranes or skin necrosis: No Has patient had a PCN reaction that required hospitalization Yes Has patient had a PCN reaction occurring within the last 10 years: No If all of the above answers are "NO", then may proceed with Cephalosporin use.   . Sulfa Antibiotics Rash    Stevens-Johnson rash  . Vancomycin Rash and Other (See Comments)    RED MAN SYNDROME CAN HAVE IF GIVEN OVER 2HOURS  . Wheat Extract Other (See Comments)    CELIAC DISEASE CELIAC DISEASE  . Duloxetine Other (See Comments)    Restless legs  . Gabapentin Nausea Only and Other (See Comments)    Other reaction(s): nausea, mental status Drowsiness and restlessness.  . Tegaderm Ag Mesh [Silver] Dermatitis    Causes blistering wounds   . Citalopram Other (See Comments)    Dystonia  . Acetaminophen Other (See Comments)    Elevates liver enzymes.   . Ibuprofen Other (See Comments)    Contraindicated with Xarelto.   . Nortriptyline Other (See Comments)    Dry mouth at 25 mg dose.  Tolerates 10 mg dose  . Pregabalin Other (See Comments)    Ineffective  . Sulfacetamide Sodium-Sulfur Rash    Family History: Family History  Problem Relation Age of Onset  . Stroke Maternal Grandfather        83  . Heart attack Maternal Grandfather   . Hypertension Mother   . Psoriasis Mother   . Other Mother        meningioma developed ~  2019  . Cancer Paternal Grandfather   . Heart attack Paternal Grandfather   . Stroke Paternal Uncle        age 68  . Polycythemia Paternal Uncle   . Stroke Maternal Grandmother   . Congestive Heart Failure Maternal Grandmother   . Heart attack Maternal Grandmother   . Protein C deficiency  Sister 72       Miscarriages    Social History:  reports that he has never smoked. He has never used smokeless tobacco. He reports that he does not drink alcohol or use drugs.  ROS: UROLOGY Frequent Urination?: Yes Hard to postpone urination?: Yes Burning/pain with urination?: No Get up at night to urinate?: Yes Leakage of urine?: Yes Urine stream starts and stops?: No Trouble starting stream?: Yes Do you have to strain to urinate?: No Blood in urine?: No Urinary tract infection?: No Sexually transmitted disease?: No Injury to kidneys or bladder?: No Painful intercourse?: No Weak stream?: No Erection problems?: No Penile pain?: No Currently pregnant?: No Vaginal bleeding?: No Last menstrual period?: n  Gastrointestinal Nausea?: No Vomiting?: No Indigestion/heartburn?: No Diarrhea?: No Constipation?: Yes  Constitutional Fever: No Night sweats?: No Weight loss?: No Fatigue?: Yes  Skin Skin rash/lesions?: Yes Itching?: Yes  Eyes Blurred vision?: Yes Double vision?: Yes  Ears/Nose/Throat Sore throat?: No Sinus problems?: No  Hematologic/Lymphatic Swollen glands?: No Easy bruising?: Yes  Cardiovascular Leg swelling?: Yes Chest pain?: No  Respiratory Cough?: No Shortness of breath?: No  Endocrine Excessive thirst?: Yes  Musculoskeletal Back pain?: Yes Joint pain?: Yes  Neurological Headaches?: No Dizziness?: Yes  Psychologic Depression?: Yes Anxiety?: Yes  Physical Exam: BP (!) 143/96 (BP Location: Left Arm, Patient Position: Sitting, Cuff Size: Normal)   Pulse (!) 106   Ht 5' 9"  (1.753 m)   Wt 222 lb 6.4 oz (100.9 kg) Comment: pt stated  BMI 32.84 kg/m   Constitutional:  Well nourished. Alert and oriented, No acute distress. HEENT: [place] AT, moist mucus membranes.  Trachea midline, no masses. Cardiovascular: No clubbing, cyanosis, or edema. Respiratory: Normal respiratory effort, no increased work of breathing. GI: Abdomen is soft,  non tender, non distended, no abdominal masses. Liver and spleen not palpable.  No hernias appreciated.  Stool sample for occult testing is not indicated.   GU: No CVA tenderness.  No bladder fullness or masses.  Normal external genitalia, normal pubic hair distribution, no lesions.  Normal urethral meatus, no lesions, no prolapse, no discharge.   No urethral masses, tenderness and/or tenderness. No bladder fullness, tenderness or masses. Normal vagina mucosa, no discharge, no lesions, fair pelvic support, grade I cystocele no rectocele noted.  Anus and perineum are without rashes or lesions.    Skin: No rashes, bruises or suspicious lesions. Lymph: No cervical or inguinal adenopathy. Neurologic: Grossly intact, no focal deficits, moving all 4 extremities. Psychiatric: Normal mood and affect.  Laboratory Data: Lab Results  Component Value Date   WBC 3.7 09/09/2017   HGB 12.5 (L) 09/09/2017   HCT 42.0 09/09/2017   MCV 76 (L) 09/09/2017   PLT 248 09/09/2017    Lab Results  Component Value Date   CREATININE 1.12 09/09/2017    No results found for: PSA  Lab Results  Component Value Date   TESTOSTERONE 917.6 (H) 09/23/2017    No results found for: HGBA1C  Lab Results  Component Value Date   TSH 4.980 (H) 03/17/2017    No results found for: CHOL, HDL, CHOLHDL, VLDL, LDLCALC  Lab Results  Component Value Date   AST 24 09/09/2017   Lab Results  Component Value Date   ALT 27 09/09/2017   No components found for: ALKALINEPHOPHATASE No components found for: BILIRUBINTOTAL  No results found for: ESTRADIOL  Urinalysis Negative.  See Epic.   I have reviewed the labs.   Pertinent Imaging: CLINICAL DATA:  Pt sent by cancer center. Pt has hx of polycythemia and has chronic DVT. Has had 4 day hx of RUQ pain radiating to R middle back. Labs and urine done at cancer center. Was sent here for further imaging. Does not have gallbladder  EXAM: CT ABDOMEN AND PELVIS WITH  CONTRAST  TECHNIQUE: Multidetector CT imaging of the abdomen and pelvis was performed using the standard protocol following bolus administration of intravenous contrast.  CONTRAST:  166m ISOVUE-300 IOPAMIDOL (ISOVUE-300) INJECTION 61%  COMPARISON:  12/29/2013  FINDINGS: Lower chest:  No acute findings.  Hepatobiliary: Cholecystectomy clips. Mild central intrahepatic biliary ductal dilatation and distention of the common duct, stable. No focal liver lesion.  Pancreas: No mass, inflammatory changes, or other significant abnormality.  Spleen: Within normal limits in size and appearance.  Adrenals/Urinary Tract: Prominent left extrarenal collecting system and mild left hydronephrosis, stable. No nephrolithiasis or solid renal lesion. Ureters decompressed. Urinary bladder incompletely distended.  Stomach/Bowel: No evidence of obstruction, inflammatory process, or abnormal fluid collections. Normal appendix.  Vascular/Lymphatic: No pathologically enlarged lymph nodes. No evidence of abdominal aortic aneurysm. Bilateral pelvic phleboliths.  Reproductive: No mass or other significant abnormality.  Other: No ascites.  No free air.  Musculoskeletal: Degenerative disc disease L5-S1. Negative for fracture.  IMPRESSION: 1. No acute abdominal process. 2. Chronic mild left hydronephrosis.   Electronically Signed   By: DLucrezia EuropeM.D.   On: 08/22/2015 22:37 I have independently reviewed the films.    Results for TTAMERON, LAMA(MRN [id number] as of 12/25/2017 10:02  Ref. Range 12/07/2017 11:47  Scan Result Unknown 343m   Assessment & Plan:    1. Left flank pain  History of chronic hydronephrosis Will obtain a RUS for comparison  2. Frequency Discussed behavioral therapies, bladder training and bladder control strategies Offered medical therapy with beta-3 adrenergic receptor agonist - would like to try the beta-3 adrenergic receptor agonist (Myrbetriq).   Given Myrbetriq 25 mg samples, #28.  I have reviewed with the patient of the side effects of Myrbetriq, such as: elevation in BP, urinary retention and/or HA.   RTC in 3 weeks for PVR and symptom recheck   3. Incontinence See above  Return in about 3 weeks (around 12/28/2017) for IPSS and PVR.  These notes generated with voice recognition software. I apologize for typographical errors.  SHZara CouncilPA-C  [facility] [address] LaneSuAllianceuMarshalltonNC [id number]

## 2017-12-08 ENCOUNTER — Ambulatory Visit (INDEPENDENT_AMBULATORY_CARE_PROVIDER_SITE_OTHER): Payer: BLUE CROSS/BLUE SHIELD | Admitting: Psychology

## 2017-12-08 DIAGNOSIS — F4312 Post-traumatic stress disorder, chronic: Secondary | ICD-10-CM | POA: Diagnosis not present

## 2017-12-08 LAB — CBC WITH DIFFERENTIAL/PLATELET
Basophils Absolute: 0 10*3/uL (ref 0.0–0.2)
Basos: 0 %
EOS (ABSOLUTE): 0 10*3/uL (ref 0.0–0.4)
EOS: 0 %
HEMATOCRIT: 42.2 % (ref 37.5–51.0)
Hemoglobin: 13.5 g/dL (ref 13.0–17.7)
Immature Grans (Abs): 0 10*3/uL (ref 0.0–0.1)
Immature Granulocytes: 0 %
Lymphocytes Absolute: 0.9 10*3/uL (ref 0.7–3.1)
Lymphs: 23 %
MCH: 23.6 pg — ABNORMAL LOW (ref 26.6–33.0)
MCHC: 32 g/dL (ref 31.5–35.7)
MCV: 74 fL — ABNORMAL LOW (ref 79–97)
Monocytes Absolute: 0.3 10*3/uL (ref 0.1–0.9)
Monocytes: 7 %
Neutrophils Absolute: 2.8 10*3/uL (ref 1.4–7.0)
Neutrophils: 70 %
Platelets: 207 10*3/uL (ref 150–450)
RBC: 5.71 x10E6/uL (ref 4.14–5.80)
RDW: 15.3 % (ref 12.3–15.4)
WBC: 4 10*3/uL (ref 3.4–10.8)

## 2017-12-08 LAB — BASIC METABOLIC PANEL
BUN / CREAT RATIO: 14 (ref 9–20)
BUN: 16 mg/dL (ref 6–24)
CHLORIDE: 97 mmol/L (ref 96–106)
CO2: 25 mmol/L (ref 20–29)
CREATININE: 1.13 mg/dL (ref 0.76–1.27)
Calcium: 9.2 mg/dL (ref 8.7–10.2)
GFR calc Af Amer: 86 mL/min/{1.73_m2} (ref >59)
GFR calc non Af Amer: 74 mL/min/{1.73_m2} (ref >59)
GLUCOSE: 103 mg/dL — AB (ref 65–99)
Potassium: 3.7 mmol/L (ref 3.5–5.2)
SODIUM: 138 mmol/L (ref 134–144)

## 2017-12-10 ENCOUNTER — Ambulatory Visit (INDEPENDENT_AMBULATORY_CARE_PROVIDER_SITE_OTHER): Payer: BLUE CROSS/BLUE SHIELD | Admitting: Psychology

## 2017-12-10 DIAGNOSIS — F4310 Post-traumatic stress disorder, unspecified: Secondary | ICD-10-CM | POA: Diagnosis not present

## 2017-12-10 LAB — CULTURE, URINE COMPREHENSIVE

## 2017-12-14 ENCOUNTER — Encounter
Payer: BLUE CROSS/BLUE SHIELD | Attending: Physical Medicine & Rehabilitation | Admitting: Physical Medicine & Rehabilitation

## 2017-12-14 ENCOUNTER — Telehealth: Payer: Self-pay | Admitting: Physical Medicine & Rehabilitation

## 2017-12-14 ENCOUNTER — Encounter: Payer: Self-pay | Admitting: Physical Medicine & Rehabilitation

## 2017-12-14 VITALS — BP 123/82 | HR 98 | Ht 69.0 in | Wt 224.8 lb

## 2017-12-14 DIAGNOSIS — K900 Celiac disease: Secondary | ICD-10-CM | POA: Diagnosis not present

## 2017-12-14 DIAGNOSIS — F418 Other specified anxiety disorders: Secondary | ICD-10-CM | POA: Diagnosis not present

## 2017-12-14 DIAGNOSIS — M7522 Bicipital tendinitis, left shoulder: Secondary | ICD-10-CM | POA: Diagnosis not present

## 2017-12-14 DIAGNOSIS — Z79891 Long term (current) use of opiate analgesic: Secondary | ICD-10-CM | POA: Diagnosis not present

## 2017-12-14 DIAGNOSIS — D6859 Other primary thrombophilia: Secondary | ICD-10-CM

## 2017-12-14 DIAGNOSIS — M5416 Radiculopathy, lumbar region: Secondary | ICD-10-CM

## 2017-12-14 DIAGNOSIS — S43002A Unspecified subluxation of left shoulder joint, initial encounter: Secondary | ICD-10-CM | POA: Insufficient documentation

## 2017-12-14 DIAGNOSIS — I69398 Other sequelae of cerebral infarction: Secondary | ICD-10-CM | POA: Insufficient documentation

## 2017-12-14 DIAGNOSIS — I69320 Aphasia following cerebral infarction: Secondary | ICD-10-CM | POA: Diagnosis not present

## 2017-12-14 DIAGNOSIS — Z79899 Other long term (current) drug therapy: Secondary | ICD-10-CM | POA: Diagnosis not present

## 2017-12-14 DIAGNOSIS — D751 Secondary polycythemia: Secondary | ICD-10-CM | POA: Insufficient documentation

## 2017-12-14 DIAGNOSIS — L4050 Arthropathic psoriasis, unspecified: Secondary | ICD-10-CM | POA: Insufficient documentation

## 2017-12-14 DIAGNOSIS — M545 Low back pain: Secondary | ICD-10-CM | POA: Diagnosis not present

## 2017-12-14 DIAGNOSIS — I69351 Hemiplegia and hemiparesis following cerebral infarction affecting right dominant side: Secondary | ICD-10-CM | POA: Insufficient documentation

## 2017-12-14 DIAGNOSIS — Z7901 Long term (current) use of anticoagulants: Secondary | ICD-10-CM | POA: Insufficient documentation

## 2017-12-14 DIAGNOSIS — Z86718 Personal history of other venous thrombosis and embolism: Secondary | ICD-10-CM | POA: Diagnosis not present

## 2017-12-14 DIAGNOSIS — G8929 Other chronic pain: Secondary | ICD-10-CM | POA: Diagnosis present

## 2017-12-14 DIAGNOSIS — Z9181 History of falling: Secondary | ICD-10-CM | POA: Diagnosis not present

## 2017-12-14 DIAGNOSIS — Z87890 Personal history of sex reassignment: Secondary | ICD-10-CM | POA: Diagnosis not present

## 2017-12-14 DIAGNOSIS — R209 Unspecified disturbances of skin sensation: Secondary | ICD-10-CM | POA: Insufficient documentation

## 2017-12-14 DIAGNOSIS — M13862 Other specified arthritis, left knee: Secondary | ICD-10-CM | POA: Diagnosis not present

## 2017-12-14 DIAGNOSIS — Z8673 Personal history of transient ischemic attack (TIA), and cerebral infarction without residual deficits: Secondary | ICD-10-CM

## 2017-12-14 DIAGNOSIS — H359 Unspecified retinal disorder: Secondary | ICD-10-CM

## 2017-12-14 DIAGNOSIS — I6782 Cerebral ischemia: Secondary | ICD-10-CM

## 2017-12-14 MED ORDER — OXYCODONE HCL 10 MG PO TABS: 10.0000 mg | ORAL_TABLET | Freq: Three times a day (TID) | ORAL | 0 refills | Status: DC | PRN

## 2017-12-14 MED ORDER — MORPHINE SULFATE ER 60 MG PO CP24: 60.0000 mg | ORAL_CAPSULE | Freq: Two times a day (BID) | ORAL | 0 refills | Status: DC

## 2017-12-14 NOTE — Patient Instructions (Signed)
PLEASE FEEL FREE TO CALL OUR OFFICE WITH ANY PROBLEMS OR QUESTIONS ([phone])      

## 2017-12-14 NOTE — Progress Notes (Signed)
Subjective:    Patient ID: Jesse Bowers, adult    DOB: [birth year], 52 y.o.   MRN: [id number]  HPI   Jesse Bowers who is here in follow-up of his functional deficits and pain. He reports that things are essentially stable. He saw Dr. Felecia Bowers last month who requested another MRI of his brain and carotid dopplers. Also this past monh, he began to experience some visual phenomena including flashes and saw his optometry who saw retinal injury and signs of microvessel disease. In addition to this over the last few months he has felt generally weaker although he's trying to maintain a level of activity at home. He maintains follow up of his bladder with urology which he feels has become more spastic as well.   He remains on kadian for pain control and oxycodone 37m for breakthrough pain. He is only using 2, sometimes 3 oxycodone daily.   Pain Inventory Average Pain 7 Pain Right Now 5 My pain is constant, sharp and tingling  In the last 24 hours, has pain interfered with the following? General activity 8 Relation with others 8 Enjoyment of life 7 What TIME of day is your pain at its worst? daytime and evening Sleep (in general) Fair  Pain is worse with: walking, bending, sitting, standing and some activites Pain improves with: rest, heat/ice, pacing activities, medication and injections Relief from Meds: 9  Mobility walk with assistance use a walker ability to climb steps?  no do you drive?  no  Function I need assistance with the following:  feeding, dressing, bathing, meal prep, household duties and shopping  Neuro/Psych bladder control problems weakness numbness tremor tingling trouble walking spasms depression anxiety loss of taste or smell  Prior Studies Any changes since last visit?  no  Physicians involved in your care Any changes since last visit?  no   Family History  Problem Relation Age of Onset  . Stroke Maternal Grandfather        557 . Heart attack Maternal  Grandfather   . Hypertension Mother   . Psoriasis Mother   . Other Mother        meningioma developed ~2019  . Cancer Paternal Grandfather   . Heart attack Paternal Grandfather   . Stroke Paternal Uncle        age 52 . Polycythemia Paternal Uncle   . Stroke Maternal Grandmother   . Congestive Heart Failure Maternal Grandmother   . Heart attack Maternal Grandmother   . Protein C deficiency Sister 339       Miscarriages  Social History   Socioeconomic History  . Marital status: Married    Spouse name: Not on file  . Number of children: 2  . Years of education: 4y college  . Highest education level: Not on file  Occupational History  . Occupation: Pediatric Nurse practitioner    Comment: [place] working since [place]  Social Needs  . Financial resource strain: Not on file  . Food insecurity:    Worry: Not on file    Inability: Not on file  . Transportation needs:    Medical: Not on file    Non-medical: Not on file  Tobacco Use  . Smoking status: Never Smoker  . Smokeless tobacco: Never Used  Substance and Sexual Activity  . Alcohol use: No    Frequency: Never  . Drug use: No  . Sexual activity: Yes    Birth control/protection: None    Comment: patient is a transgender  on testosterone shots, no biological kids  Lifestyle  . Physical activity:    Days per week: Not on file    Minutes per session: Not on file  . Stress: Not on file  Relationships  . Social connections:    Talks on phone: Not on file    Gets together: Not on file    Attends religious service: Not on file    Active member of club or organization: Not on file    Attends meetings of clubs or organizations: Not on file    Relationship status: Not on file  Other Topics Concern  . Not on file  Social History Narrative   Education 4 year college, former Therapist, sports X 15 years, pediatric nurse practitioner x 6 years, did NP degree from [employer] [place]. Relocated to [place] about 2 months ago from  [place], [place]. Patient was in [place] for last 4 years and prior to that in [place]. His wife is working as Scientist, research (physical sciences) for [employer]. Patient is not working and applying for disability. They have 2 kids but no biologic children.    Past Surgical History:  Procedure Laterality Date  . ABDOMINAL HYSTERECTOMY Bilateral 1994   TAH, BSO- tranverse incision at 52 yo  . ANKLE ARTHROSCOPY WITH RECONSTRUCTION Right 2007  . CHOLECYSTECTOMY     laparoscopic  . HIP ARTHROSCOPY W/ LABRAL REPAIR Right 05/11/2013   acetabular labral tear 03/30/2013  . KNEE SURGERY Bilateral 1984   Right ACL, left PCL repair  . LITHOTRIPSY  2005  . LIVER BIOPSY  2013   normal results.  [name] MASTECTOMY Bilateral    prior to 2009  . NASAL SEPTUM SURGERY N/A 09/20/2015   by ENT Dr. Lucia Bowers  . OVARIAN CYST SURGERY Left    size of grapefruit, was informed that she had shortened vagina  . THYROIDECTOMY, PARTIAL Left 2008  . TOTAL KNEE REVISION Left 02/06/2016   Procedure: LEFT TOTAL KNEE REVISION;  Surgeon: Jesse Arabian, MD;  Location: [facility];  Service: Orthopedics;  Laterality: Left;  . TOTAL KNEE REVISION Left 04/22/2017   Procedure: Left knee polyethylene revision;  Surgeon: Jesse Arabian, MD;  Location: [facility];  Service: Orthopedics;  Laterality: Left;  . UPPER GI ENDOSCOPY  2003   Past Medical History:  Diagnosis Date  . Abnormal weight loss   . Anxiety    PTSD per patient  . Arthritis   . Celiac disease   . Cervical neck pain with evidence of disc disease    patient has a cyst   . Degenerative disc disease at L5-S1 level    with stenosis  . 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 Family history of adverse reaction to anesthesia    family has problems with anesthesia of nausea and vomiting   . 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 Gluten enteropathy   . H/O parotitis    right   . History of kidney stones   . Hx-TIA (transient ischemic attack)    2015  . Neck pain   . Neuromuscular disorder  ([place])    bilateral neuropathy feet.  . Pneumonia 12/17/2010  . Polycythemia   . Polycythemia, secondary   . PONV (postoperative nausea and vomiting)   . Protein C deficiency ([place])    Dr. Anne Fu  . Psoriasis   . psoriatic arthritis   . Scaphoid fracture of wrist 09/23/2013  . Sleep apnea    split night study last done by Dr. Felecia Bowers 06/18/15 shows severe OSA,  CSA, and hypersomnia, rec bipap  . Stenosis of ureteropelvic junction (UPJ)    left  . Stroke [facility])    CVA vs TIA in left cerebrum causing slight right sided weakness-Dr. Felecia Bowers follows  . 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 Tachycardia    hx of   . Transfusion history    past history- none recent   BP 123/82   Pulse 98   Ht 5' 9"  (1.753 m)   Wt 224 lb 12.8 oz (102 kg)   SpO2 93%   BMI 33.20 kg/m   Opioid Risk Score:   Fall Risk Score:  `1  Depression screen PHQ 2/9  Depression screen [facility] 2/9 09/23/2017 08/07/2017 06/24/2017 02/18/2017 11/10/2016 09/15/2016  Decreased Interest 0 3 0 1 0 0  Down, Depressed, Hopeless 0 3 0 1 0 0  PHQ - 2 Score 0 6 0 2 0 0  Altered sleeping - - 0 - - -  Tired, decreased energy - - 0 - - -  Change in appetite - - 0 - - -  Feeling bad or failure about yourself  - - 0 - - -  Trouble concentrating - - 0 - - -  Moving slowly or fidgety/restless - - 0 - - -  Suicidal thoughts - - 0 - - -  PHQ-9 Score - - 0 - - -  Some recent data might be hidden     Review of Systems  Constitutional: Negative.   HENT: Negative.   Eyes: Negative.   Respiratory: Positive for apnea.   Cardiovascular: Negative.   Gastrointestinal: Positive for constipation.  Endocrine: Negative.   Genitourinary: Negative.   Musculoskeletal: Positive for arthralgias, back pain, gait problem, joint swelling and myalgias.  Skin: Negative.   Allergic/Immunologic: Negative.   Neurological: Positive for tremors, weakness and numbness.  Hematological: Negative.   Psychiatric/Behavioral: Positive  for dysphoric mood. The patient is nervous/anxious.   All other systems reviewed and are negative.      Objective:   Physical Exam General: No acute distress HEENT: EOMI, oral membranes moist Cards: reg rate  Chest: normal effort Abdomen: Soft, NT, ND Skin: dry, intact Extremities: no edema Skin:Clean and intact without signs of breakdown. Left TKA scar.  Neuro:Pt is pleasant and alert and oriented x 3. Minimalright facial weakness and sensory loss is appreciated. UE and LE motor not tested. Reflexes remain hyperactive.patch sensory loss in limbs.  Musculoskeletal:ongoing pain with bilateral shoulder IR/ER but has functional use of both limbs.  Psych: pleasant, less anxious today   Assessment & Plan:  1. Psoriatic arthritis with pain in multiple areas, most prominently feet, hands, elbows.Hispain is also related to his prior CVA and associated motor/sensory changepatient's complaining of increased s/spasticity. 2. 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  3. Hx of left total knee revisionagain on 04/21/17 per [facility] 4. Chronic low back pain---MRI withsevere DDD at L5-S1. Left S1 radiculopathy? S/p LEFT L5-S1 ESI 5. Protein C deficiency  6. Left shoulder subluxation, bicipital tendonitis  7. Central sleep apnea  8. Polycythemia  9. Depression with anxiety. 10. Left lateral epidondylitis 11. 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 Most recent cervical MRI (07/08/17) without significant change 12. 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  Plan:  1.Continue effexor 355m xr daily  2.MaintainHEPas possible. Discussed alternatives for braces. ?lateral support leftAFO.      -  AFO per [facility] 3.Voltaren gel to hands, feet, knees, elbows 4.Baclofen for lower extermity spasms/back pain.20 mg 4  times daily #120. Continue current rx. Myrbetriq for bladder spasms 5. Refilled kadian 6m q12 #60and oxycodone 125m#90today.(will reduce oxycodone 1048mo #60 with the next fill in December)  -We will continue the controlled substance monitoring program, this consists of regular clinic visits, examinations, routine drug screening, pill counts as well as use [place].   -second rx provided.  6.will not continue pamelor due to dry mouth. 7.Has had good results with left L5-S1 translaminar injection per Dr. KirGeorge Hugha 3rd injection perhaps after the new year.  8. Ordered MRI of brain without contrast to assess for further small vessel disease especially given retinal findings and history of hypercoagulable state. Needs further neurological follow up as well.   15 minutes of face to face patient care time were spent during this visit.follow-up withmein about 39mo36monthime.  All questions were encouraged and answered.

## 2017-12-14 NOTE — Telephone Encounter (Signed)
Pt mentioned torn retina in rt & left eye, changes in vision during appt today 12/14/17. Dr [name] mentioned getting a referral for testing? Please advise....  Thanks, Vilinda Blanks.

## 2017-12-15 ENCOUNTER — Ambulatory Visit: Payer: BLUE CROSS/BLUE SHIELD | Admitting: Psychology

## 2017-12-16 NOTE — Telephone Encounter (Signed)
Referred pt to Dr. Wyatt Portela

## 2017-12-16 NOTE — Telephone Encounter (Signed)
Contacted patient, left voicemail explaining as such

## 2017-12-17 ENCOUNTER — Ambulatory Visit: Payer: BLUE CROSS/BLUE SHIELD | Admitting: Neurology

## 2017-12-17 ENCOUNTER — Encounter

## 2017-12-17 ENCOUNTER — Ambulatory Visit: Payer: BLUE CROSS/BLUE SHIELD | Admitting: Psychology

## 2017-12-17 ENCOUNTER — Encounter: Payer: Self-pay | Admitting: Hematology

## 2017-12-18 ENCOUNTER — Telehealth: Payer: Self-pay | Admitting: Hematology

## 2017-12-18 NOTE — Telephone Encounter (Signed)
Appts r/s per patient request and patient notified of date/time change per 10/18 sch msg

## 2017-12-21 ENCOUNTER — Ambulatory Visit
Admission: RE | Admit: 2017-12-21 | Discharge: 2017-12-21 | Disposition: A | Discharge status: Home / Self Care | Payer: BLUE CROSS/BLUE SHIELD | Source: Ambulatory Visit | Attending: Urology | Admitting: Urology

## 2017-12-21 DIAGNOSIS — N135 Crossing vessel and stricture of ureter without hydronephrosis: Secondary | ICD-10-CM | POA: Insufficient documentation

## 2017-12-22 ENCOUNTER — Ambulatory Visit: Payer: BLUE CROSS/BLUE SHIELD | Admitting: Psychology

## 2017-12-22 ENCOUNTER — Other Ambulatory Visit: Payer: Self-pay | Admitting: Physician Assistant

## 2017-12-24 ENCOUNTER — Ambulatory Visit: Payer: BLUE CROSS/BLUE SHIELD | Admitting: Family Medicine

## 2017-12-24 ENCOUNTER — Ambulatory Visit (INDEPENDENT_AMBULATORY_CARE_PROVIDER_SITE_OTHER): Payer: BLUE CROSS/BLUE SHIELD | Admitting: Psychology

## 2017-12-24 DIAGNOSIS — F4310 Post-traumatic stress disorder, unspecified: Secondary | ICD-10-CM | POA: Diagnosis not present

## 2017-12-24 NOTE — Telephone Encounter (Signed)
done

## 2017-12-25 ENCOUNTER — Ambulatory Visit: Payer: BLUE CROSS/BLUE SHIELD | Admitting: Family Medicine

## 2017-12-26 ENCOUNTER — Ambulatory Visit
Admission: RE | Admit: 2017-12-26 | Discharge: 2017-12-26 | Disposition: A | Discharge status: Home / Self Care | Payer: BLUE CROSS/BLUE SHIELD | Source: Ambulatory Visit | Attending: Physical Medicine & Rehabilitation | Admitting: Physical Medicine & Rehabilitation

## 2017-12-26 ENCOUNTER — Telehealth: Payer: Self-pay | Admitting: Family Medicine

## 2017-12-26 ENCOUNTER — Encounter: Payer: Self-pay | Admitting: Family Medicine

## 2017-12-26 DIAGNOSIS — D6859 Other primary thrombophilia: Secondary | ICD-10-CM

## 2017-12-26 DIAGNOSIS — Z8673 Personal history of transient ischemic attack (TIA), and cerebral infarction without residual deficits: Secondary | ICD-10-CM

## 2017-12-26 DIAGNOSIS — I6782 Cerebral ischemia: Secondary | ICD-10-CM

## 2017-12-27 NOTE — Telephone Encounter (Signed)
Refill req by pt for testosterone - - -pt put in specific directions due to prior mix-ups.  Message sent to Dr. Brigitte Pulse.

## 2017-12-28 ENCOUNTER — Telehealth: Payer: Self-pay | Admitting: Cardiovascular Disease

## 2017-12-28 MED ORDER — POTASSIUM CHLORIDE ER 10 MEQ PO TBCR: 10.0000 meq | EXTENDED_RELEASE_TABLET | Freq: Two times a day (BID) | ORAL | 11 refills | Status: DC

## 2017-12-28 MED ORDER — FUROSEMIDE 20 MG PO TABS: ORAL_TABLET | ORAL | 9 refills | Status: DC

## 2017-12-28 NOTE — Telephone Encounter (Signed)
New message  Pt c/o medication issue:  1. Name of Medication: potassium chloride (K-DUR) 10 MEQ tablet  2. How are you currently taking this medication (dosage and times per day)? 1 time daily  3. Are you having a reaction (difficulty breathing--STAT)?no   4. What is your medication issue?Patients states that he can't swallow this medication. Patient states that he wants to stop this medication and start the klor-con in a quantity of 60 pills. Please call to discuss.

## 2017-12-28 NOTE — Telephone Encounter (Signed)
SPOKE TO PATIENT . Patient requesting   Increase quantity of potassium 10 meq twice a day - extra 5 tablet if needed. Requesting  "round tablet" easier to swallow.   Per patient , he is taking 40 mg of lasix everyday , and also has lasix 20 mg prescription for prn usage.  RN  Spoke with Suanne Marker PA FOR CLARIFICATION.   RN SPOKE WITH PHARMACIST direction  Given for medications  patient has 2 rx   lasix 20 mg - is prn Lasix 40 mg is daily Potassium 10 meg  Take 2 daily , may take an extra dose if an extra dose of lasix is needed.

## 2017-12-29 ENCOUNTER — Encounter: Payer: Self-pay | Admitting: Urology

## 2017-12-29 ENCOUNTER — Ambulatory Visit: Payer: BLUE CROSS/BLUE SHIELD | Admitting: Psychology

## 2017-12-29 ENCOUNTER — Ambulatory Visit (INDEPENDENT_AMBULATORY_CARE_PROVIDER_SITE_OTHER): Payer: BLUE CROSS/BLUE SHIELD | Admitting: Urology

## 2017-12-29 VITALS — BP 127/91 | HR 108

## 2017-12-29 DIAGNOSIS — N135 Crossing vessel and stricture of ureter without hydronephrosis: Secondary | ICD-10-CM

## 2017-12-29 DIAGNOSIS — R350 Frequency of micturition: Secondary | ICD-10-CM

## 2017-12-29 LAB — BLADDER SCAN AMB NON-IMAGING

## 2017-12-29 MED ORDER — TESTOSTERONE CYPIONATE 200 MG/ML IM SOLN: 100.0000 mg | INTRAMUSCULAR | 0 refills | Status: DC

## 2017-12-29 NOTE — Telephone Encounter (Signed)
Not a prior mixup and no way to order that 2048m quantity he wanted - no listing like that in Epic - only choice is the same way I sent in last rx. It was likely only changed to a 1 mo supply due to his insurance.  Ssent in new rx to pharm specifically requesting a 10 ml vial but he will likely need to discuss this with the pharmacist to ensure he is given (or why he can't be given) a 10 mL vial. Please let pt know if his ins isn't going to cover the 10 ml vial that he can purchase it NOT [facility] good[facility] coupon.

## 2017-12-29 NOTE — Progress Notes (Signed)
12/29/2017 12:09 PM   Jesse Bowers Nov 27, 1965 [id number]  Referring provider: Concepcion Elk, MD [address] [place], [place] [address]  Chief Complaint  Patient presents with  . Urinary Frequency    follow up    HPI: Patient is a 52 year old transgender male (male to male) with a history of left hydronephrosis, frequency and incontinence who presents today for a three week follow up after a trial of Myrbetriq 25 mg daily and to discuss his RUS report.   Background history Patient is a 52 year old transgender male (male to male) with a history of mild left hydronephrosis who is referred by Dr. [name] for hesitancy, frequency and incontinence.  Contrast CT in 2017 noted mild chronic hydronephrosis.  He is having flank pain at times.   Today, he complains of frequency x 10-15, strong urgency (he had the urge to void three times during the visit), nocturia x 1-3 (sometimes take lasix in the evening and has sleep apnea and sleeps with BiPAP), incontinence and hesitancy.  These symptoms started 6 months ago.  Patient denies any gross hematuria, dysuria or suprapubic/flank pain.  Patient denies any fevers, chills, nausea or vomiting.  His UA is negative.  His PVR is 34 mL.  He is suffering from constipation.   He has a small can of diet Coke daily, otherwise mostly water.    At his visit on 12/07/2017, he was started on Myrbetriq 25 mg daily.  He states that since taking the Myrbetriq he has had episodes of and frequent urination.  He states that there are occasions where the stream is slow and he has difficulty starting the urinary stream.  He states the Myrbetriq is helpful as it is decreased his urinary frequency and nocturia.  I PSS score 22/5-6,  PVR is 107 mL.   IPSS    Row Name 12/29/17 1100         International Prostate Symptom Score   How often have you had the sensation of not emptying your bladder?  Almost always     How often have you had to urinate less than every two  hours?  Less than half the time     How often have you found you stopped and started again several times when you urinated?  More than half the time     How often have you found it difficult to postpone urination?  Less than 1 in 5 times     How often have you had a weak urinary stream?  More than half the time     How often have you had to strain to start urination?  More than half the time     How many times did you typically get up at night to urinate?  2 Times     Total IPSS Score  22       Quality of Life due to urinary symptoms   If you were to spend the rest of your life with your urinary condition just the way it is now how would you feel about that?  Unhappy        Score:  1-7 Mild 8-19 Moderate 20-35 Severe  He is still experiencing left-sided flank pain.  RUS on 12/21/2017 revealed slightly more fullness of the left pelvocaliceal system than was noted on the ultrasound of 11/27/2016 possibly due to the history of chronic UPJ narrowing.  No renal calculi are seen. There is mild cortical thinning bilaterally.  PMH: Past Medical History:  Diagnosis Date  . Abnormal weight loss   . Anxiety    PTSD per patient  . Arthritis   . Celiac disease   . Cervical neck pain with evidence of disc disease    patient has a cyst   . Degenerative disc disease at L5-S1 level    with stenosis  . 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 Family history of adverse reaction to anesthesia    family has problems with anesthesia of nausea and vomiting   . 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 Gluten enteropathy   . H/O parotitis    right   . History of kidney stones   . Hx-TIA (transient ischemic attack)    2015  . Neck pain   . Neuromuscular disorder ([facility])    bilateral neuropathy feet.  . Pneumonia 12/17/2010  . Polycythemia   . Polycythemia, secondary   . PONV (postoperative nausea and vomiting)   . Protein C deficiency ([facility])    Dr. Anne Fu  . Psoriasis   .  psoriatic arthritis   . Scaphoid fracture of wrist 09/23/2013  . Sleep apnea    split night study last done by Dr. [name] 06/18/15 shows severe OSA, CSA, and hypersomnia, rec bipap  . Stenosis of ureteropelvic junction (UPJ)    left  . 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 [name] follows  . Syrinx of spinal cord ([facility]) 01/06/2014   c spine on MRI  . Tachycardia    hx of   . Transfusion history    past history- none recent    Surgical History: Past Surgical History:  Procedure Laterality Date  . ABDOMINAL HYSTERECTOMY Bilateral 1994   TAH, BSO- tranverse incision at 52 yo  . ANKLE ARTHROSCOPY WITH RECONSTRUCTION Right 2007  . CHOLECYSTECTOMY     laparoscopic  . HIP ARTHROSCOPY W/ LABRAL REPAIR Right 05/11/2013   acetabular labral tear 03/30/2013  . KNEE SURGERY Bilateral 1984   Right ACL, left PCL repair  . LITHOTRIPSY  2005  . LIVER BIOPSY  2013   normal results.  [name] MASTECTOMY Bilateral    prior to 2009  . NASAL SEPTUM SURGERY N/A 09/20/2015   by ENT Dr. [name]  . OVARIAN CYST SURGERY Left    size of grapefruit, was informed that she had shortened vagina  . THYROIDECTOMY, PARTIAL Left 2008  . TOTAL KNEE REVISION Left 02/06/2016   Procedure: LEFT TOTAL KNEE REVISION;  Surgeon: [name], MD;  Location: [facility];  Service: Orthopedics;  Laterality: Left;  . TOTAL KNEE REVISION Left 04/22/2017   Procedure: Left knee polyethylene revision;  Surgeon: [name], MD;  Location: [facility];  Service: Orthopedics;  Laterality: Left;  . UPPER GI ENDOSCOPY  2003    Home Medications:  Allergies as of 12/29/2017      Reactions   Penicillin G Anaphylaxis, Other (See Comments)   Has patient had a PCN reaction causing immediate rash, facial/tongue/throat swelling, SOB or lightheadedness with hypotension: Yes Has patient had a PCN reaction causing severe rash involving mucus membranes or skin necrosis: No Has patient had a PCN reaction that  required hospitalization Yes Has patient had a PCN reaction occurring within the last 10 years: No If all of the above answers are "NO", then may proceed with Cephalosporin use.   Sulfa Antibiotics Rash   Stevens-Johnson rash   Vancomycin Rash, Other (See Comments)   RED MAN  SYNDROME CAN HAVE IF GIVEN OVER 2HOURS   Wheat Extract Other (See Comments)   CELIAC DISEASE CELIAC DISEASE   Duloxetine Other (See Comments)   Restless legs   Gabapentin Nausea Only, Other (See Comments)   Other reaction(s): nausea, mental status Drowsiness and restlessness.   Tegaderm Ag Mesh [silver] Dermatitis   Causes blistering wounds   Citalopram Other (See Comments)   Dystonia   Acetaminophen Other (See Comments)   Elevates liver enzymes.    Ibuprofen Other (See Comments)   Contraindicated with Xarelto.    Nortriptyline Other (See Comments)   Dry mouth at 25 mg dose.  Tolerates 10 mg dose   Pregabalin Other (See Comments)   Ineffective   Sulfacetamide Sodium-sulfur Rash      Medication List        Accurate as of 12/29/17 12:09 PM. Always use your most recent med list.          baclofen 20 MG tablet Commonly known as:  LIORESAL Take 1 tablet (20 mg total) by mouth 4 (four) times daily.   betamethasone dipropionate 0.05 % cream Commonly known as:  DIPROLENE Apply topically 2 (two) times daily.   clorazepate 7.5 MG tablet Commonly known as:  TRANXENE Take 7.5 mg by mouth 2 (two) times daily.   clorazepate 15 MG tablet Commonly known as:  TRANXENE Take 15 mg by mouth at bedtime.   COSENTYX [place] Inject into the skin.   desonide 0.05 % ointment Commonly known as:  DESOWEN APPLY TO AFFECTED AREAS TWICE DAILY AS NEEDED FOR PSORIASIS.   ENSTILAR 0.005-0.064 % Foam Generic drug:  Calcipotriene-Betameth Diprop Apply 1 application topically 2 (two) times daily.   furosemide 40 MG tablet Commonly known as:  LASIX Take 1 tablet (40 mg total) by mouth daily. Take 1 extra tab daily prn  for a weight gain of 3lbs in a day or 5 lbs per week.   furosemide 20 MG tablet Commonly known as:  LASIX TAKE 1 TAB BY MOUTH DAILY AS  NEEDED ,THIS IN ADDITION TABLET WITH 40  MG TABLET  AS NEEDED FOR WEIGHT GAIN OF 3LBS IN 1DAY OR 5LBS/WEEK   mirabegron ER 25 MG Tb24 tablet Commonly known as:  MYRBETRIQ Take 1 tablet (25 mg total) by mouth daily.   morphine 60 MG 24 hr capsule Commonly known as:  KADIAN Take 1 capsule (60 mg total) by mouth every 12 (twelve) hours.   NEEDLE (DISP) 18 G 18G X 1-1/2" Misc 1 Units by Does not apply route once a week.   NEEDLE (DISP) 23 G 23G X 3/4" Misc 1 Units by Does not apply route once a week.   neomycin-polymyxin-hydrocortisone 3.5-10000-1 OTIC suspension Commonly known as:  CORTISPORIN PLACE 4 DROPS INTO LEFT EAR TWICE A DAY AS NEEDED FOR PAIN AND DRAINAGE   NONFORMULARY OR COMPOUNDED ITEM Apply 1 application topically 3 (three) times daily. 8% ketamine, 5% amitriptyline, 5% baclofen, 5% gabapentin  60 GM   Oxycodone HCl 10 MG Tabs Take 1 tablet (10 mg total) by mouth every 8 (eight) hours as needed ((score 7 to 10)).   potassium chloride 10 MEQ tablet Commonly known as:  K-DUR Take 1 tablet (10 mEq total) by mouth 2 (two) times daily. May take an extra tablet daily if extra lasix is taken.   prazosin 1 MG capsule Commonly known as:  MINIPRESS Take 1 mg by mouth at bedtime. Takes 1 mg and 2 mg to equal 3 mg at bedtime   prazosin  2 MG capsule Commonly known as:  MINIPRESS Take 2 mg by mouth at bedtime. Takes 1 mg and 2 mg to equal 3 mg at bedtime   rivaroxaban 20 MG Tabs tablet Commonly known as:  XARELTO TAKE 1 TABLET DAILY BEFORE BEDTIME   Syringe (Disposable) 1 ML Misc 1 Units by Does not apply route once a week.   testosterone cypionate 200 MG/ML injection Commonly known as:  DEPOTESTOSTERONE CYPIONATE Inject 0.5 mLs (100 mg total) into the muscle once a week.   ursodiol 500 MG tablet Commonly known as:  ACTIGALL Take  500 mg by mouth 3 (three) times daily.   venlafaxine XR 150 MG 24 hr capsule Commonly known as:  EFFEXOR-XR Take 2 capsules (300 mg total) by mouth daily with breakfast.       Allergies:  Allergies  Allergen Reactions  . Penicillin G Anaphylaxis and Other (See Comments)    Has patient had a PCN reaction causing immediate rash, facial/tongue/throat swelling, SOB or lightheadedness with hypotension: Yes Has patient had a PCN reaction causing severe rash involving mucus membranes or skin necrosis: No Has patient had a PCN reaction that required hospitalization Yes Has patient had a PCN reaction occurring within the last 10 years: No If all of the above answers are "NO", then may proceed with Cephalosporin use.   . Sulfa Antibiotics Rash    Stevens-Johnson rash  . Vancomycin Rash and Other (See Comments)    RED MAN SYNDROME CAN HAVE IF GIVEN OVER 2HOURS  . Wheat Extract Other (See Comments)    CELIAC DISEASE CELIAC DISEASE  . Duloxetine Other (See Comments)    Restless legs  . Gabapentin Nausea Only and Other (See Comments)    Other reaction(s): nausea, mental status Drowsiness and restlessness.  . Tegaderm Ag Mesh [Silver] Dermatitis    Causes blistering wounds   . Citalopram Other (See Comments)    Dystonia  . Acetaminophen Other (See Comments)    Elevates liver enzymes.   . Ibuprofen Other (See Comments)    Contraindicated with Xarelto.   . Nortriptyline Other (See Comments)    Dry mouth at 25 mg dose.  Tolerates 10 mg dose  . Pregabalin Other (See Comments)    Ineffective  . Sulfacetamide Sodium-Sulfur Rash    Family History: Family History  Problem Relation Age of Onset  . Stroke Maternal Grandfather        6  . Heart attack Maternal Grandfather   . Hypertension Mother   . Psoriasis Mother   . Other Mother        meningioma developed ~2019  . Cancer Paternal Grandfather   . Heart attack Paternal Grandfather   . Stroke Paternal Uncle        age 36  .  Polycythemia Paternal Uncle   . Stroke Maternal Grandmother   . Congestive Heart Failure Maternal Grandmother   . Heart attack Maternal Grandmother   . Protein C deficiency Sister 48       Miscarriages    Social History:  reports that he has never smoked. He has never used smokeless tobacco. He reports that he does not drink alcohol or use drugs.  ROS: UROLOGY Frequent Urination?: No Hard to postpone urination?: No Burning/pain with urination?: No Get up at night to urinate?: Yes Leakage of urine?: Yes Urine stream starts and stops?: No Trouble starting stream?: Yes Do you have to strain to urinate?: No Blood in urine?: No Urinary tract infection?: No Sexually transmitted disease?: No Injury  to kidneys or bladder?: Yes Painful intercourse?: No Weak stream?: No Erection problems?: No Penile pain?: No Currently pregnant?: No Vaginal bleeding?: No Last menstrual period?: n  Gastrointestinal Nausea?: No Vomiting?: No Indigestion/heartburn?: No Diarrhea?: No Constipation?: Yes  Constitutional Fever: No Night sweats?: No Weight loss?: No Fatigue?: Yes  Skin Skin rash/lesions?: No Itching?: Yes  Eyes Blurred vision?: No Double vision?: No  Ears/Nose/Throat Sore throat?: No Sinus problems?: No  Hematologic/Lymphatic Swollen glands?: No Easy bruising?: Yes  Cardiovascular Leg swelling?: Yes Chest pain?: No  Respiratory Cough?: No Shortness of breath?: No  Endocrine Excessive thirst?: Yes  Musculoskeletal Back pain?: Yes Joint pain?: Yes  Neurological Headaches?: No Dizziness?: No  Psychologic Depression?: No Anxiety?: Yes  Physical Exam: BP (!) 127/91   Pulse (!) 108   Constitutional: Well nourished. Alert and oriented, No acute distress. HEENT: [employer] AT, moist mucus membranes. Trachea midline, no masses. Cardiovascular: No clubbing, cyanosis, or edema. Respiratory: Normal respiratory effort, no increased work of breathing. Skin: No  rashes, bruises or suspicious lesions. Lymph: No cervical or inguinal adenopathy. Neurologic: Grossly intact, no focal deficits, moving all 4 extremities. Psychiatric: Normal mood and affect.  Laboratory Data: Lab Results  Component Value Date   WBC 4.0 12/07/2017   HGB 13.5 12/07/2017   HCT 42.2 12/07/2017   MCV 74 (L) 12/07/2017   PLT 207 12/07/2017    Lab Results  Component Value Date   CREATININE 1.13 12/07/2017    No results found for: PSA  Lab Results  Component Value Date   TESTOSTERONE 917.6 (H) 09/23/2017    No results found for: HGBA1C  Lab Results  Component Value Date   TSH 4.980 (H) 03/17/2017    No results found for: CHOL, HDL, CHOLHDL, VLDL, LDLCALC  Lab Results  Component Value Date   AST 24 09/09/2017   Lab Results  Component Value Date   ALT 27 09/09/2017   No components found for: ALKALINEPHOPHATASE No components found for: BILIRUBINTOTAL  No results found for: ESTRADIOL  I have reviewed the labs.   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 There is mild cortical thinning. No hydronephrosis is seen in the echogenicity of the renal parenchyma is within normal limits.  Left Kidney:  Length: 12.4 cm compared to prior measurements of 11.0 cm. There does appear to be mild cortical thinning and there is mild to moderate hydronephrosis present which persists after voiding. This appears slightly greater degree than noted on the ultrasound of 11/27/2016. The renal parenchyma is unremarkable.  Bladder:  The urinary bladder is not well distended. Ureteral jets were not visualized.  IMPRESSION: 1.  Slightly more fullness of the left pelvocaliceal system than was noted on the ultrasound of 11/27/2016 possibly due to the history of chronic UPJ narrowing. 2. No renal calculi are seen. There is mild cortical thinning bilaterally.   Electronically Signed   By: Ivar Drape M.D.   On: 12/22/2017 08:15 I have independently reviewed the films and moderate hydronephrosis is noted in the left kidney.  Results for VICTORMANUEL, MCLURE (MRN [id number]) as of 12/25/2017 10:02  Ref. Range 12/07/2017 11:47  Scan Result Unknown 3m    Assessment & Plan:    1. Left  hydronephrosis Likely due to a congenital UPJ obstruction but will obtain a CT urogram for more detailed look at the urological anatomy I explained to the patient that a contrast material will be injected into a vein and that in rare instances, an allergic reaction can result and may even life threatening   The patient denies any allergies to contrast, iodine and/or seafood and is not taking metformin. RTC for CT report    2. Left flank pain  See above   3. Frequency Patient would like to continue the Myrbetriq 25 mg, but we will have the patient take the medication every other day more samples were given  3. Incontinence See above  Return for CTU report .  These notes generated with voice recognition software. I apologize for typographical errors.  Zara Council, PA-C  [facility] [address]  [place], [place] [address] [phone]

## 2017-12-30 NOTE — Telephone Encounter (Signed)
LMOVM with Dr. Raul Del message

## 2017-12-31 ENCOUNTER — Encounter: Payer: Self-pay | Admitting: Family Medicine

## 2017-12-31 ENCOUNTER — Ambulatory Visit (INDEPENDENT_AMBULATORY_CARE_PROVIDER_SITE_OTHER): Payer: BLUE CROSS/BLUE SHIELD | Admitting: Psychology

## 2017-12-31 DIAGNOSIS — F4310 Post-traumatic stress disorder, unspecified: Secondary | ICD-10-CM

## 2018-01-01 ENCOUNTER — Telehealth: Payer: Self-pay

## 2018-01-01 NOTE — Telephone Encounter (Signed)
Called and spoke to pt about Rx for testosterone and informed pt that I have talked to the pharmacy and they stated the reason why they cant fill the 22m vial is because insurance will not cover that large amount. Informed him that the pharmacy states they can only cover for 90 days and the 173mvial would be for 140 day.  He states he will contact his insurance and see why they are not approving the medication and let the provider know. I verbalized understanding.

## 2018-01-05 ENCOUNTER — Ambulatory Visit: Payer: BLUE CROSS/BLUE SHIELD | Admitting: Psychology

## 2018-01-05 ENCOUNTER — Ambulatory Visit (INDEPENDENT_AMBULATORY_CARE_PROVIDER_SITE_OTHER): Payer: BLUE CROSS/BLUE SHIELD | Admitting: Family Medicine

## 2018-01-05 DIAGNOSIS — Z79899 Other long term (current) drug therapy: Secondary | ICD-10-CM

## 2018-01-06 ENCOUNTER — Telehealth: Payer: Self-pay | Admitting: Physical Medicine & Rehabilitation

## 2018-01-06 ENCOUNTER — Ambulatory Visit: Payer: Self-pay | Admitting: Hematology

## 2018-01-06 ENCOUNTER — Other Ambulatory Visit: Payer: Self-pay

## 2018-01-06 DIAGNOSIS — H3500 Unspecified background retinopathy: Secondary | ICD-10-CM

## 2018-01-06 NOTE — Telephone Encounter (Signed)
Referral Opthamologist ([facility]) office called and states patient needs to be referred to retina specialist - they use [facility] or [facility] -- if you have question [phone] ext [phone]

## 2018-01-06 NOTE — Telephone Encounter (Signed)
Ok, done 

## 2018-01-07 ENCOUNTER — Ambulatory Visit: Payer: BLUE CROSS/BLUE SHIELD | Admitting: Psychology

## 2018-01-07 LAB — LIPID PANEL
Chol/HDL Ratio: 2.7 ratio (ref 0.0–5.0)
Cholesterol, Total: 142 mg/dL (ref 100–199)
HDL: 52 mg/dL (ref >39)
LDL Calculated: 81 mg/dL (ref 0–99)
Triglycerides: 45 mg/dL (ref 0–149)
VLDL Cholesterol Cal: 9 mg/dL (ref 5–40)

## 2018-01-07 LAB — TESTT+TESTF+SHBG
SEX HORMONE BINDING: 35.3 nmol/L (ref 19.3–76.4)
TESTOSTERONE FREE: 10.8 pg/mL (ref 7.2–24.0)
TESTOSTERONE, TOTAL: 610.1 ng/dL (ref 264.0–916.0)

## 2018-01-07 LAB — ESTRADIOL: Estradiol: 35.8 pg/mL (ref 7.6–42.6)

## 2018-01-07 NOTE — Progress Notes (Signed)
Lab visit only.

## 2018-01-08 ENCOUNTER — Ambulatory Visit: Admission: RE | Admit: 2018-01-08 | Payer: BLUE CROSS/BLUE SHIELD | Source: Ambulatory Visit

## 2018-01-08 ENCOUNTER — Encounter: Payer: Self-pay | Admitting: Family Medicine

## 2018-01-08 ENCOUNTER — Ambulatory Visit (INDEPENDENT_AMBULATORY_CARE_PROVIDER_SITE_OTHER): Payer: BLUE CROSS/BLUE SHIELD | Admitting: Family Medicine

## 2018-01-08 ENCOUNTER — Other Ambulatory Visit: Payer: Self-pay

## 2018-01-08 VITALS — BP 118/72 | HR 111 | Temp 98.0°F | Ht 69.0 in | Wt 224.0 lb

## 2018-01-08 DIAGNOSIS — R269 Unspecified abnormalities of gait and mobility: Secondary | ICD-10-CM | POA: Diagnosis not present

## 2018-01-08 DIAGNOSIS — F640 Transsexualism: Secondary | ICD-10-CM

## 2018-01-08 DIAGNOSIS — R413 Other amnesia: Secondary | ICD-10-CM

## 2018-01-08 DIAGNOSIS — Z96659 Presence of unspecified artificial knee joint: Secondary | ICD-10-CM

## 2018-01-08 DIAGNOSIS — G4733 Obstructive sleep apnea (adult) (pediatric): Secondary | ICD-10-CM

## 2018-01-08 DIAGNOSIS — M6281 Muscle weakness (generalized): Secondary | ICD-10-CM | POA: Diagnosis not present

## 2018-01-08 DIAGNOSIS — M1990 Unspecified osteoarthritis, unspecified site: Secondary | ICD-10-CM

## 2018-01-08 DIAGNOSIS — D72819 Decreased white blood cell count, unspecified: Secondary | ICD-10-CM

## 2018-01-08 DIAGNOSIS — H539 Unspecified visual disturbance: Secondary | ICD-10-CM

## 2018-01-08 DIAGNOSIS — F418 Other specified anxiety disorders: Secondary | ICD-10-CM

## 2018-01-08 DIAGNOSIS — T84018S Broken internal joint prosthesis, other site, sequela: Secondary | ICD-10-CM

## 2018-01-08 DIAGNOSIS — M170 Bilateral primary osteoarthritis of knee: Secondary | ICD-10-CM

## 2018-01-08 DIAGNOSIS — Z789 Other specified health status: Secondary | ICD-10-CM

## 2018-01-08 DIAGNOSIS — D6859 Other primary thrombophilia: Secondary | ICD-10-CM

## 2018-01-08 DIAGNOSIS — R479 Unspecified speech disturbances: Secondary | ICD-10-CM | POA: Diagnosis not present

## 2018-01-08 DIAGNOSIS — G894 Chronic pain syndrome: Secondary | ICD-10-CM

## 2018-01-08 DIAGNOSIS — D45 Polycythemia vera: Secondary | ICD-10-CM

## 2018-01-08 MED ORDER — TESTOSTERONE CYPIONATE 200 MG/ML IM SOLN: 100.0000 mg | INTRAMUSCULAR | 0 refills | Status: DC

## 2018-01-08 NOTE — Patient Instructions (Addendum)
     If you have lab work done today you will be contacted with your lab results within the next 2 weeks.  If you have not heard from [place] then please contact us. The fastest way to get your results is to register for My Chart.   IF you received an x-ray today, you will receive an invoice from [facility]. Please contact [facility] at [phone] with questions or concerns regarding your invoice.   IF you received labwork today, you will receive an invoice from [facility]. Please contact [facility] at [phone] with questions or concerns regarding your invoice.   Our billing staff will not be able to assist you with questions regarding bills from these companies.  You will be contacted with the lab results as soon as they are available. The fastest way to get your results is to activate your My Chart account. Instructions are located on the last page of this paperwork. If you have not heard from [place] regarding the results in 2 weeks, please contact this office.      Constipation, Adult Constipation is when a person has fewer bowel movements in a week than normal, has difficulty having a bowel movement, or has stools that are dry, hard, or larger than normal. Constipation may be caused by an underlying condition. It may become worse with age if a person takes certain medicines and does not take in enough fluids. Follow these instructions at home: Eating and drinking   Eat foods that have a lot of fiber, such as fresh fruits and vegetables, whole grains, and beans.  Limit foods that are high in fat, low in fiber, or overly processed, such as french fries, hamburgers, cookies, candies, and soda.  Drink enough fluid to keep your urine clear or pale yellow. General instructions  Exercise regularly or as told by your health care provider.  Go to the restroom when you have the urge to go. Do not hold it in.  Take over-the-counter and prescription medicines only as told by  your health care provider. These include any fiber supplements.  Practice pelvic floor retraining exercises, such as deep breathing while relaxing the lower abdomen and pelvic floor relaxation during bowel movements.  Watch your condition for any changes.  Keep all follow-up visits as told by your health care provider. This is important. Contact a health care provider if:  You have pain that gets worse.  You have a fever.  You do not have a bowel movement after 4 days.  You vomit.  You are not hungry.  You lose weight.  You are bleeding from the anus.  You have thin, pencil-like stools. Get help right away if:  You have a fever and your symptoms suddenly get worse.  You leak stool or have blood in your stool.  Your abdomen is bloated.  You have severe pain in your abdomen.  You feel dizzy or you faint. This information is not intended to replace advice given to you by your health care provider. Make sure you discuss any questions you have with your health care provider. Document Released: 11/16/2003 Document Revised: 09/07/2015 Document Reviewed: 08/08/2015 Elsevier Interactive Patient Education  2018 [employer].

## 2018-01-08 NOTE — Progress Notes (Signed)
Subjective:    Patient: Jesse Bowers  DOB: Dec 14, 1965; 52 y.o.   MRN: [id number]  Chief Complaint  Patient presents with  . Chronic Conditions    3 month follow-up    HPI Never ran out of testosterone - always was able to keep old to get last dose. Pharmacist told him that it was a problem with the manufacturer in [place] for 10 mL.  On the lower dose of 0.36m (=976m qwk and feeling fine other than a little acne and less stress drive.  Diagnosed with a retinal tear and latice retinopathy. Saw falling stars first right then left  Got hearing aids - thought he had a small stroke though imaging was unchanged (but did not have contrast)   Also lost sense of smell on taste about a month ago.   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  Was urinating 12-15x/day - was having to urinate again.  So started on Myrbetriq qod and helped some (though on qd was having urinary retention). Has known UPJ stenosis since 2000 when he had a kidney stone that stuck at UPJ stenosis - req lithotripsy then stent was placed but was temporary but was now told it could be fixed under robotic surgery so considering this. Was supposed to have CT scan Friday but deferred as was under colonoscopy. Did check to see if obstructing for bowels but was not.  Had developed some worsening hydronephrosis  Sched for colonoscopy but cleanout didn't work so has to reschedule for December.. So he is put on miralax cleanout 2d prior (powerade has magensium - gatorade has sodium)   Diagnosed with moderate hearing loss and speech to talk is severe-to-profound so the hearing aides allow him to fuzz out back ground noise and hear better. At [employer] Was told him.  Dr. FeBurr Medicoecondary erthroytosis and protein C deficiency  Dr. ZiCarita PianDoes have medicare with disability but only using A since [place] for Dr. BrErlene QuanI doc rec referral [facility].  [facility] but [facility] not  Rheumatology noted sig weaness which  Derm rx'd Cosentyx for psoriasis in feet, groin, and head so UVB radiation to feet.   Medical History Past Medical History:  Diagnosis Date  . Abnormal weight loss   . Anxiety    PTSD per patient  . Arthritis   . Celiac disease   . Cervical neck pain with evidence of disc disease    patient has a cyst   . Degenerative disc disease at L5-S1 level    with stenosis  . Failed total knee arthroplasty ([facility]/20/[place]  . Family history of adverse reaction to anesthesia    family has problems with anesthesia of nausea and vomiting   . 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 Gluten enteropathy   . H/O parotitis    right   . History of kidney stones   . Hx-TIA (transient ischemic attack)    2015  . Neck pain   . Neuromuscular disorder ([place]   bilateral neuropathy feet.  . Pneumonia 12/17/2010  . Polycythemia   . Polycythemia, secondary   . PONV (postoperative nausea and vomiting)   . Protein C deficiency ([facility]   Dr. FeAnne Fu. Psoriasis   . psoriatic arthritis   . Scaphoid fracture of wrist 09/23/2013  . Sleep apnea    split night study last done  by Dr. [name] 06/18/15 shows severe OSA, CSA, and hypersomnia, rec bipap  . Stenosis of ureteropelvic junction (UPJ)    left  . 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 [name] follows  . Syrinx of spinal cord ([place]) 01/06/2014   c spine on MRI  . Tachycardia    hx of   . Transfusion history    past history- none recent   Past Surgical History:  Procedure Laterality Date  . ABDOMINAL HYSTERECTOMY Bilateral 1994   TAH, BSO- tranverse incision at 52 yo  . ANKLE ARTHROSCOPY WITH RECONSTRUCTION Right 2007  . CHOLECYSTECTOMY     laparoscopic  . HIP ARTHROSCOPY W/ LABRAL REPAIR Right 05/11/2013   acetabular labral tear 03/30/2013  . KNEE SURGERY Bilateral 1984   Right ACL, left PCL repair  . LITHOTRIPSY   2005  . LIVER BIOPSY  2013   normal results.  [name] MASTECTOMY Bilateral    prior to 2009  . NASAL SEPTUM SURGERY N/A 09/20/2015   by ENT Dr. [name]  . OVARIAN CYST SURGERY Left    size of grapefruit, was informed that she had shortened vagina  . THYROIDECTOMY, PARTIAL Left 2008  . TOTAL KNEE REVISION Left 02/06/2016   Procedure: LEFT TOTAL KNEE REVISION;  Surgeon: [name], MD;  Location: [facility];  Service: Orthopedics;  Laterality: Left;  . TOTAL KNEE REVISION Left 04/22/2017   Procedure: Left knee polyethylene revision;  Surgeon: [name], MD;  Location: [facility];  Service: Orthopedics;  Laterality: Left;  . UPPER GI ENDOSCOPY  2003   Current Outpatient Medications on File Prior to Visit  Medication Sig Dispense Refill  . baclofen (LIORESAL) 20 MG tablet Take 1 tablet (20 mg total) by mouth 4 (four) times daily. 120 tablet 3  . betamethasone dipropionate (DIPROLENE) 0.05 % cream Apply topically 2 (two) times daily.    . Calcipotriene-Betameth Diprop (ENSTILAR) 0.005-0.064 % FOAM Apply 1 application topically 2 (two) times daily.     . clorazepate (TRANXENE) 15 MG tablet Take 15 mg by mouth at bedtime.    . clorazepate (TRANXENE) 7.5 MG tablet Take 7.5 mg by mouth 2 (two) times daily.     [name] desonide (DESOWEN) 0.05 % ointment APPLY TO AFFECTED AREAS TWICE DAILY AS NEEDED FOR PSORIASIS.  2  . furosemide (LASIX) 20 MG tablet TAKE 1 TAB BY MOUTH DAILY AS  NEEDED ,THIS IN ADDITION TABLET WITH 40  MG TABLET  AS NEEDED FOR WEIGHT GAIN OF 3LBS IN 1DAY OR 5LBS/WEEK 60 tablet 9  . furosemide (LASIX) 40 MG tablet Take 1 tablet (40 mg total) by mouth daily. Take 1 extra tab daily prn for a weight gain of 3lbs in a day or 5 lbs per week. 180 tablet 0  . mirabegron ER (MYRBETRIQ) 25 MG TB24 tablet Take 1 tablet (25 mg total) by mouth daily. 1 tablet 0  . morphine (KADIAN) 60 MG 24 hr capsule Take 1 capsule (60 mg total) by mouth every 12 (twelve) hours. 60 capsule 0  . NEEDLE, DISP, 18 G 18G X  1-1/2" MISC 1 Units by Does not apply route once a week. 50 each 2  . NEEDLE, DISP, 23 G 23G X 3/4" MISC 1 Units by Does not apply route once a week. 100 each 1  . neomycin-polymyxin-hydrocortisone (CORTISPORIN) 3.5-10000-1 OTIC suspension PLACE 4 DROPS INTO LEFT EAR TWICE A DAY AS NEEDED FOR PAIN AND DRAINAGE  1  . NONFORMULARY OR COMPOUNDED  ITEM Apply 1 application topically 3 (three) times daily. 8% ketamine, 5% amitriptyline, 5% baclofen, 5% gabapentin  60 GM (Patient taking differently: Apply 1 application topically 3 (three) times daily as needed (pain). 8% ketamine, 5% amitriptyline, 5% baclofen, 5% gabapentin  60 GM) 1 each 5  . Oxycodone HCl 10 MG TABS Take 1 tablet (10 mg total) by mouth every 8 (eight) hours as needed ((score 7 to 10)). 90 tablet 0  . potassium chloride (K-DUR) 10 MEQ tablet Take 1 tablet (10 mEq total) by mouth 2 (two) times daily. May take an extra tablet daily if extra lasix is taken. 65 tablet 11  . prazosin (MINIPRESS) 1 MG capsule Take 1 mg by mouth at bedtime. Takes 1 mg and 2 mg to equal 3 mg at bedtime    . prazosin (MINIPRESS) 2 MG capsule Take 2 mg by mouth at bedtime. Takes 1 mg and 2 mg to equal 3 mg at bedtime    . rivaroxaban (XARELTO) 20 MG TABS tablet TAKE 1 TABLET DAILY BEFORE BEDTIME 90 tablet 1  . Secukinumab (COSENTYX [place]) Inject into the skin.    . Syringe, Disposable, 1 ML MISC 1 Units by Does not apply route once a week. 60 each 2  . testosterone cypionate (DEPO-TESTOSTERONE) 200 MG/ML injection Inject 0.5 mLs (100 mg total) into the muscle once a week. 10 mL 0  . ursodiol (ACTIGALL) 500 MG tablet Take 500 mg by mouth 3 (three) times daily.    [name] venlafaxine XR (EFFEXOR-XR) 150 MG 24 hr capsule Take 2 capsules (300 mg total) by mouth daily with breakfast. 60 capsule 5   No current facility-administered medications on file prior to visit.    Allergies  Allergen Reactions  . Penicillin G Anaphylaxis and Other (See Comments)    Has patient had a  PCN reaction causing immediate rash, facial/tongue/throat swelling, SOB or lightheadedness with hypotension: Yes Has patient had a PCN reaction causing severe rash involving mucus membranes or skin necrosis: No Has patient had a PCN reaction that required hospitalization Yes Has patient had a PCN reaction occurring within the last 10 years: No If all of the above answers are "NO", then may proceed with Cephalosporin use.   . Sulfa Antibiotics Rash    Stevens-Johnson rash  . Vancomycin Rash and Other (See Comments)    RED MAN SYNDROME CAN HAVE IF GIVEN OVER 2HOURS  . Wheat Extract Other (See Comments)    CELIAC DISEASE CELIAC DISEASE  . Duloxetine Other (See Comments)    Restless legs  . Gabapentin Nausea Only and Other (See Comments)    Other reaction(s): nausea, mental status Drowsiness and restlessness.  . Tegaderm Ag Mesh [Silver] Dermatitis    Causes blistering wounds   . Citalopram Other (See Comments)    Dystonia  . Acetaminophen Other (See Comments)    Elevates liver enzymes.   . Ibuprofen Other (See Comments)    Contraindicated with Xarelto.   . Nortriptyline Other (See Comments)    Dry mouth at 25 mg dose.  Tolerates 10 mg dose  . Pregabalin Other (See Comments)    Ineffective  . Sulfacetamide Sodium-Sulfur Rash   Family History  Problem Relation Age of Onset  . Stroke Maternal Grandfather        59  . Heart attack Maternal Grandfather   . Hypertension Mother   . Psoriasis Mother   . Other Mother        meningioma developed ~2019  . Cancer Paternal Grandfather   .  Heart attack Paternal Grandfather   . Stroke Paternal Uncle        age 11  . Polycythemia Paternal Uncle   . Stroke Maternal Grandmother   . Congestive Heart Failure Maternal Grandmother   . Heart attack Maternal Grandmother   . Protein C deficiency Sister 68       Miscarriages   Social History   Socioeconomic History  . Marital status: Married    Spouse name: Not on file  . Number of  children: 2  . Years of education: 4y college  . Highest education level: Not on file  Occupational History  . Occupation: Pediatric Nurse practitioner    Comment: Not working since [place] 2015  Social Needs  . Financial resource strain: Not on file  . Food insecurity:    Worry: Not on file    Inability: Not on file  . Transportation needs:    Medical: Not on file    Non-medical: Not on file  Tobacco Use  . Smoking status: Never Smoker  . Smokeless tobacco: Never Used  Substance and Sexual Activity  . Alcohol use: No    Frequency: Never  . Drug use: No  . Sexual activity: Yes    Birth control/protection: None    Comment: patient is a transgender on testosterone shots, no biological kids  Lifestyle  . Physical activity:    Days per week: Not on file    Minutes per session: Not on file  . Stress: Not on file  Relationships  . Social connections:    Talks on phone: Not on file    Gets together: Not on file    Attends religious service: Not on file    Active member of club or organization: Not on file    Attends meetings of clubs or organizations: Not on file    Relationship status: Not on file  Other Topics Concern  . Not on file  Social History Narrative   Education 4 year college, former Therapist, sports X 15 years, pediatric nurse practitioner x 6 years, did NP degree from [employer] [place]. Relocated to [place] about 2 months ago from [place], [place]. Patient was in [place] for last 4 years and prior to that in [place]. His wife is working as Scientist, research (physical sciences) for [employer]. Patient is not working and applying for disability. They have 2 kids but no biologic children.    Depression screen [facility] 2/9 09/23/2017 08/07/2017 06/24/2017 02/18/2017 11/10/2016  Decreased Interest 0 3 0 1 0  Down, Depressed, Hopeless 0 3 0 1 0  PHQ - 2 Score 0 6 0 2 0  Altered sleeping - - 0 - -  Tired, decreased energy - - 0 - -  Change in appetite - - 0 - -  Feeling bad or failure about yourself  - - 0 - -    Trouble concentrating - - 0 - -  Moving slowly or fidgety/restless - - 0 - -  Suicidal thoughts - - 0 - -  PHQ-9 Score - - 0 - -  Some recent data might be hidden    ROS As noted in HPI  Objective:  BP 118/72   Pulse (!) 111   Temp 98 F (36.7 C) (Oral)   Ht 5' 9"  (1.753 m)   Wt 224 lb (101.6 kg)   SpO2 96%   BMI 33.08 kg/m  Physical Exam  Constitutional: He is oriented to person, place, and time. He appears well-developed and well-nourished. No distress.  HENT:  Head: Normocephalic and atraumatic.  Eyes: Pupils are equal, round, and reactive to light. Conjunctivae are normal. No scleral icterus.  Neck: Normal range of motion. Neck supple. No thyromegaly present.  Cardiovascular: Normal rate, regular rhythm, normal heart sounds and intact distal pulses.  Pulmonary/Chest: Effort normal and breath sounds normal. No respiratory distress.  Musculoskeletal: He exhibits no edema.  Lymphadenopathy:    He has no cervical adenopathy.  Neurological: He is alert and oriented to person, place, and time.  Skin: Skin is warm and dry. He is not diaphoretic.  Psychiatric: He has a normal mood and affect. His behavior is normal.    POC TESTING No visits with results within 3 Day(s) from this visit.  Latest known visit with results is:  Office Visit on 01/05/2018  Component Date Value Ref Range Status  . Testosterone, total 01/05/2018 610.1  264.0 - 916.0 ng/dL Final   Comment: This [facility] LC/MS-MS method is currently certified by the [employer] Program ([place]). Adult male reference interval is based on a population of healthy nonobese males (BMI <30) between 10 and 75 years old. [place], [place] [phone]. PMID: 20254270.   [name] Testosterone, Free 01/05/2018 10.8  7.2 - 24.0 pg/mL Final  . Sex Hormone Binding 01/05/2018 35.3  19.3 - 76.4 nmol/L Final  . Estradiol 01/05/2018 35.8  7.6 - 42.6 pg/mL Final   Roche ECLIA methodology  . Cholesterol, Total  01/05/2018 142  100 - 199 mg/dL Final  . Triglycerides 01/05/2018 45  0 - 149 mg/dL Final  . HDL 01/05/2018 52  >39 mg/dL Final  . VLDL Cholesterol Cal 01/05/2018 9  5 - 40 mg/dL Final  . LDL Calculated 01/05/2018 81  0 - 99 mg/dL Final  . Chol/HDL Ratio 01/05/2018 2.7  0.0 - 5.0 ratio Final   Comment:                                   T. Chol/HDL Ratio                                             Men  Women                               1/2 Avg.Risk  3.4    3.3                                   Avg.Risk  5.0    4.4                                2X Avg.Risk  9.6    7.1                                3X Avg.Risk 23.4   11.0      Assessment & Plan:   1. Male-to-male transgender person   2. Muscle weakness   3. Gait abnormality   4. Speech disturbance, unspecified type   5. Transient vision disturbance   6. Memory loss   7. Chronic pain  syndrome   8. Depression with anxiety   9. Polycythemia vera ([place])   10. Protein C deficiency ([place])   11. Leukopenia, unspecified type   12. Arthritis   13. Osteoarthritis of both knees, unspecified osteoarthritis type   14. Failed total knee arthroplasty, sequela   15. OSA (obstructive sleep apnea)      Patient will continue on current chronic medications other than changes noted above, so ok to refill when needed.   See after visit summary for patient specific instructions.  Meds ordered this encounter  Medications  . testosterone cypionate (DEPO-TESTOSTERONE) 200 MG/ML injection    Sig: Inject 0.5 mLs (100 mg total) into the muscle once a week.    Dispense:  10 mL    Refill:  0    Please make sure to dispense a 10 ml vial - pt does NOT want a collection of 1 ml vials    Patient verbalized to me that they understand the following: diagnosis, what is being done for them, what to expect and what should be done at home.  Their questions have been answered. They understand that I am unable to predict every possible medication interaction  or adverse outcome and that if any unexpected symptoms arise, they should contact us and their pharmacist, as well as never hesitate to seek urgent/emergent care at [facility] or ER if they think it might be warranted.    Over 40 min spent in face-to-face evaluation of and consultation with patient and coordination of care.  Over 50% of this time was spent counseling this patient regarding numerous multiple medical problems  [name], MD, MPH [facility] at [place] [address] [place], [place]  [address] [phone] Office phone  [phone] Office fax  01/08/18 10:55 AM

## 2018-01-10 ENCOUNTER — Encounter: Payer: Self-pay | Admitting: Family Medicine

## 2018-01-10 NOTE — Progress Notes (Addendum)
[name] FOLLOW UP NOTE DATE OF VISIT: 01/11/2018   Patient Care Team: Shawnee Knapp, MD as PCP - General (Family Medicine) Bernadene Bell, MD (Inactive) as Consulting Physician (Hematology) Sheryn Bison, MD as Referring Physician (Dermatology) Elease Hashimoto, Trixie Deis, MD as Referring Physician (Gastroenterology) [name], MD as Consulting Physician (Hematology) Rozetta Nunnery, MD as Consulting Physician (Otolaryngology) Doree Fudge, PhD as Consulting Physician (Psychology) Lendon Colonel, MD as Referring Physician (Psychiatry) Meredith Staggers, MD as Consulting Physician (Physical Medicine and Rehabilitation) Hermelinda Medicus, MD as Consulting Physician (Rheumatology) [name], MD as Consulting Physician (Orthopedic Surgery) Justice Britain, MD as Consulting Physician (Orthopedic Surgery)  CHIEF COMPLAINTS Follow up DVT and polycythemia  HISTORY OF INITIAL PRESENTING ILLNESS (from Dr. Renella Cunas note):   Jesse Bowers 52 y.o. 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 Patient is a former Software engineer. Patient had a fractured foot repair in 2007 and caused a right leg DVT. At the time he took Coumadin for 3 months.   In May of this year 2015 patient presented with right-sided weakness and slurring of speech, he felt tired, had some nausea and was having trouble gripping the soap when he was in the shower. He checked himself in the mirror there was no facial droop but based on the symptoms he texted his wife took him to the hospital where he was kept overnight. His symptoms of TIA lasted for more than 24 hours and he had appropriate neuro imaging done. When he was seen by a neurologist later, he was told that he had a left frontal stroke. This happened at a young age of 43 so his primary care physician ordered hypercoagulable panel and that showed a protein C deficiency. This TIA or  stroke event took place on 07/27/2013 an MRI brain from that date showed changes of early chronic small vessel ischemic changes. A note from the previous hematologist Dr Laury Axon in [facility] indicated that at [facility] they also felt that it was not a stroke. Carotid ultrasound showed minimal atherosclerotic plaques. The hypercoagulable panel was drawn on 08/19/2013 and he was negative for factor V Leiden mutation, prothrombin gene mutation. He had normal levels of antithrombin III and protein S but the protein C level was decreased at 47%(reference range is 70-180%). A repeat protein C activity done on 09/28/2013 was 58% still low. Lupus anticoagulant was negative. Beta-2 glycoprotein antibodies were negative.  Patient is currently receiving a baby aspirin for last 3 months. The hematologist in [place] Dr. Alain Marion also gave him some Lovenox injections for prophylactic use just before a long distance travel. He encouraged him to avoid inactivity/immobility. Patient was not given an anticoagulant such as warfarin or the new generation drugs like Xarelto or Eliquis.  On 04/28/2013 patient's CBC showed a white count of 6.2 hemoglobin 18.7 hematocrit 53.1 MCV 92 platelet count of 207. Differential was normal. Chemistry panel was normal creatinine 1.23. Patient does have history of elevated liver enzymes in the past and the etiology of that is not clear likely medication related. He had liver biopsy done in 2013 which was reportedly normal. Patient was taking Remicade for over an year and it was helping his skin lesions of psoriasis but not the joint pain. It was switched to Enbrel which he is still taking a subcutaneous shot once a week that is every Sunday.  Patient also have some pain control issues and was seeing a pain  specialist in [place]. He is in the process of getting a referral to a pain specialist here. His pain medications include morphine extended release, oxycodone, occasional naproxen  and methadone. Patient is also on antidepressants.  Patient also had a clot at PICC line for antibiotics in 2006. Patient had history of psoriasis and psoriatic arthritis being managed by a rheumatologist in [place]. Now he is shifting his care to a local rheumatologist here. Most of his psoriasis involves the bottom of the feet mostly on the left side, hands, knees and ears.  Patient also had a history of right-sided parotitis documented in May 2015 that was treated with antibiotics. He does have chronic dry mouth because of sleep apnea as well as his antidepressant therapy.  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 That itself can increase the risk for both arterial as well as venous thromboembolic events. 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 For his depression patient was on Prozac in March 2015 but it was causing decreased sexual libido and changed to Wellbutrin in March 2015. He does have mild constipation with the use of narcotics and takes Colace twice a day.  Patient is also complaining of 50 pound weight loss in the last 3-4 months. He thinks it is unintentional. He has been on immunosuppressant medication such as Remicade and Enbrel and sometimes those can be associated with possibility of non-Hodgkin lymphomas. He also have chronic pain and recently saw an orthopedic physician who recommended MRI of the neck which will be scheduled. He is also seeing a rheumatologist next month, a neurologist and a pain specialist. He does complain of left shoulder pain and left elbow pain. There was some discussion by previous rheumatologist in [place] about switching his biological therapy boost to Stelara or [place] Nail once he is evaluated by a rheumatologist. Patient had already received his flu shot as well as pneumonia shot. He also complains of having significant  short-term memory problems and trouble concentrating which could be from his medications or TIA episode.  So patient's active hematologic problems are  #1 Protein C deficiency documented twice. #2 TIA/CVA in May 2015 #3 history of DVT in 2007 Right leg treated with Coumadin x 3 months. It was provoked by foot injury #3 Use of immunosuppressant medications which increase the likelihood of lymphomas. 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  CURRENT THERAPY: Xarelto 20 mg once daily, phlebotomy if hematocrit above 47%  INTERVAL HISTORY: Sie Formisano returns for follow-up. He was last seen by me 6 months ago. He has been following up with urology, family medicine, and physical medicine and rehab in the interim. Today, he is here by himself.  He is doing moderately well overall.  He still has significant mobility issue due to the pain in his knees.  He uses a walker at home, not physically active, and he uses a powered wheel-chair when he goes out.  He underwent left thumb surgery in October 2019.  And now he has some discomfort in the right is some.  He will continue follow-up with his orthopedic surgeon.  He has been seeing a new primary care physician Dr. Brigitte Pulse.  He does testosterone self injection weekly at home, and to monitor level with Dr. Brigitte Pulse every 3 months.  He noticed mild bruising at lower extremity, is compliant with Xeloda,  no other bleeding.   On review of systems, pt denies any other complaints at this time. Pertinent positives are listed and detailed within the above HPI.  MEDICAL HISTORY:   Past Medical History:  Diagnosis Date  . Abnormal weight loss   . Anxiety    PTSD per patient  . Arthritis   . Celiac disease   . Cervical neck pain with evidence of disc disease    patient has a cyst   . Degenerative disc disease at L5-S1 level    with stenosis  . 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 Family history of adverse reaction to anesthesia    family has problems with anesthesia of nausea and vomiting   . 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 Gluten enteropathy   . H/O parotitis    right   . History of kidney stones   . Hx-TIA (transient ischemic attack)    2015  . Neck pain   . Neuromuscular disorder ([place])    bilateral neuropathy feet.  . Pneumonia 12/17/2010  . Polycythemia   . Polycythemia, secondary   . PONV (postoperative nausea and vomiting)   . Protein C deficiency ([place])    Dr. Anne Fu  . Psoriasis   . psoriatic arthritis   . Scaphoid fracture of wrist 09/23/2013  . Sleep apnea    split night study last done by Dr. [name] 06/18/15 shows severe OSA, CSA, and hypersomnia, rec bipap  . Stenosis of ureteropelvic junction (UPJ)    left  . Stroke [facility])    CVA vs TIA in left cerebrum causing slight right sided weakness-Dr. [name] follows  . Syrinx of spinal cord ([place]) 01/06/2014   c spine on MRI  . Tachycardia    hx of   . Transfusion history    past history- none recent    SURGICAL HISTORY:  Past Surgical History:  Procedure Laterality Date  . ABDOMINAL HYSTERECTOMY Bilateral 1994   TAH, BSO- tranverse incision at 52 yo  . ANKLE ARTHROSCOPY WITH RECONSTRUCTION Right 2007  . CHOLECYSTECTOMY     laparoscopic  . HIP ARTHROSCOPY W/ LABRAL REPAIR Right 05/11/2013   acetabular labral tear 03/30/2013  . KNEE SURGERY Bilateral 1984   Right ACL, left PCL repair  . LITHOTRIPSY  2005  . LIVER BIOPSY  2013   normal results.  [name] MASTECTOMY Bilateral    prior to 2009  . NASAL SEPTUM SURGERY N/A 09/20/2015   by ENT Dr. [name]  . OVARIAN CYST SURGERY Left    size of grapefruit, was informed that she had shortened vagina  . THYROIDECTOMY, PARTIAL Left 2008  . TOTAL KNEE REVISION Left 02/06/2016   Procedure: LEFT TOTAL KNEE REVISION;  Surgeon: [name], MD;  Location: [facility];  Service: Orthopedics;  Laterality: Left;  . TOTAL KNEE REVISION Left  04/22/2017   Procedure: Left knee polyethylene revision;  Surgeon: [name], MD;  Location: [facility];  Service: Orthopedics;  Laterality: Left;  . UPPER GI ENDOSCOPY  2003    SOCIAL HISTORY:  Social History   Socioeconomic History  . Marital status: Married    Spouse name: Not on file  . Number of children: 2  . Years of education: 4y college  . Highest education level: Not on file  Occupational History  . Occupation: Pediatric Nurse practitioner    Comment: Not working since [place] 2015  Social Needs  . Financial resource strain: Not on file  . Food insecurity:    Worry: Not  on file    Inability: Not on file  . Transportation needs:    Medical: Not on file    Non-medical: Not on file  Tobacco Use  . Smoking status: Never Smoker  . Smokeless tobacco: Never Used  Substance and Sexual Activity  . Alcohol use: No    Frequency: Never  . Drug use: No  . Sexual activity: Yes    Birth control/protection: None    Comment: patient is a transgender on testosterone shots, no biological kids  Lifestyle  . Physical activity:    Days per week: Not on file    Minutes per session: Not on file  . Stress: Not on file  Relationships  . Social connections:    Talks on phone: Not on file    Gets together: Not on file    Attends religious service: Not on file    Active member of club or organization: Not on file    Attends meetings of clubs or organizations: Not on file    Relationship status: Not on file  . Intimate partner violence:    Fear of current or ex partner: Not on file    Emotionally abused: Not on file    Physically abused: Not on file    Forced sexual activity: Not on file  Other Topics Concern  . Not on file  Social History Narrative   Education 4 year college, former Therapist, sports X 15 years, pediatric nurse practitioner x 6 years, did NP degree from [place] [employer] [place]. Relocated to [place] about 2 months ago from [place], [place]. Patient was in [place] for last 4 years and  prior to that in [place]. His wife is working as Scientist, research (physical sciences) for [employer]. Patient is not working and applying for disability. They have 2 kids but no biologic children.     FAMILY HISTORY:  Family History  Problem Relation Age of Onset  . Stroke Maternal Grandfather        39  . Heart attack Maternal Grandfather   . Hypertension Mother   . Psoriasis Mother   . Other Mother        meningioma developed ~2019  . Cancer Paternal Grandfather   . Heart attack Paternal Grandfather   . Stroke Paternal Uncle        age 42  . Polycythemia Paternal Uncle   . Stroke Maternal Grandmother   . Congestive Heart Failure Maternal Grandmother   . Heart attack Maternal Grandmother   . Protein C deficiency Sister 73       Miscarriages    ALLERGIES:  is allergic to penicillin g; sulfa antibiotics; vancomycin; wheat extract; duloxetine; gabapentin; tegaderm ag mesh [silver]; citalopram; acetaminophen; ibuprofen; nortriptyline; pregabalin; and sulfacetamide sodium-sulfur.  MEDICATIONS:  Current Outpatient Medications  Medication Sig Dispense Refill  . baclofen (LIORESAL) 20 MG tablet Take 1 tablet (20 mg total) by mouth 4 (four) times daily. 120 tablet 3  . betamethasone dipropionate (DIPROLENE) 0.05 % cream Apply topically 2 (two) times daily.    . Calcipotriene-Betameth Diprop (ENSTILAR) 0.005-0.064 % FOAM Apply 1 application topically 2 (two) times daily.     . clorazepate (TRANXENE) 15 MG tablet Take 15 mg by mouth at bedtime.    . clorazepate (TRANXENE) 7.5 MG tablet Take 7.5 mg by mouth 2 (two) times daily.     [name] desonide (DESOWEN) 0.05 % ointment APPLY TO AFFECTED AREAS TWICE DAILY AS NEEDED FOR PSORIASIS.  2  . furosemide (LASIX) 20 MG tablet TAKE 1 TAB BY  MOUTH DAILY AS  NEEDED ,THIS IN ADDITION TABLET WITH 40  MG TABLET  AS NEEDED FOR WEIGHT GAIN OF 3LBS IN 1DAY OR 5LBS/WEEK 60 tablet 9  . furosemide (LASIX) 40 MG tablet Take 1 tablet (40 mg total) by mouth daily. Take 1 extra tab  daily prn for a weight gain of 3lbs in a day or 5 lbs per week. 180 tablet 0  . mirabegron ER (MYRBETRIQ) 25 MG TB24 tablet Take 1 tablet (25 mg total) by mouth daily. 1 tablet 0  . morphine (KADIAN) 60 MG 24 hr capsule Take 1 capsule (60 mg total) by mouth every 12 (twelve) hours. 60 capsule 0  . NEEDLE, DISP, 18 G 18G X 1-1/2" MISC 1 Units by Does not apply route once a week. 50 each 2  . NEEDLE, DISP, 23 G 23G X 3/4" MISC 1 Units by Does not apply route once a week. 100 each 1  . neomycin-polymyxin-hydrocortisone (CORTISPORIN) 3.5-10000-1 OTIC suspension PLACE 4 DROPS INTO LEFT EAR TWICE A DAY AS NEEDED FOR PAIN AND DRAINAGE  1  . NONFORMULARY OR COMPOUNDED ITEM Apply 1 application topically 3 (three) times daily. 8% ketamine, 5% amitriptyline, 5% baclofen, 5% gabapentin  60 GM (Patient taking differently: Apply 1 application topically 3 (three) times daily as needed (pain). 8% ketamine, 5% amitriptyline, 5% baclofen, 5% gabapentin  60 GM) 1 each 5  . Oxycodone HCl 10 MG TABS Take 1 tablet (10 mg total) by mouth every 8 (eight) hours as needed ((score 7 to 10)). 90 tablet 0  . potassium chloride (K-DUR) 10 MEQ tablet Take 1 tablet (10 mEq total) by mouth 2 (two) times daily. May take an extra tablet daily if extra lasix is taken. 65 tablet 11  . prazosin (MINIPRESS) 1 MG capsule Take 1 mg by mouth at bedtime. Takes 1 mg and 2 mg to equal 3 mg at bedtime    . prazosin (MINIPRESS) 2 MG capsule Take 2 mg by mouth at bedtime. Takes 1 mg and 2 mg to equal 3 mg at bedtime    . rivaroxaban (XARELTO) 20 MG TABS tablet TAKE 1 TABLET DAILY BEFORE BEDTIME 90 tablet 1  . Secukinumab (COSENTYX [place]) Inject into the skin.    . Syringe, Disposable, 1 ML MISC 1 Units by Does not apply route once a week. 60 each 2  . testosterone cypionate (DEPO-TESTOSTERONE) 200 MG/ML injection Inject 0.5 mLs (100 mg total) into the muscle once a week. 10 mL 0  . ursodiol (ACTIGALL) 500 MG tablet Take 750 mg by mouth 2 (two)  times daily.     [name] venlafaxine XR (EFFEXOR-XR) 150 MG 24 hr capsule Take 2 capsules (300 mg total) by mouth daily with breakfast. 60 capsule 5   No current facility-administered medications for this visit.     REVIEW OF SYSTEMS:   Constitutional: Denies fevers, chills, (+) fatigue  Eyes: Denies blurriness of vision, double vision or watery eyes Ears, nose, mouth, throat, and face: Denies mucositis or sore throat  Respiratory: Denies cough, dyspnea or wheezes Cardiovascular: Denies palpitation, chest discomfort or lower extremity swelling Gastrointestinal:  Denies nausea  Skin: Hx of psoriasis and arthropathy from it.  Lymphatics: Denies new lymphadenopathy or easy bruising, hx of right parotitis twice. Neurological: (+) Numbness knees to feet MSK: see HPI  Behavioral/Psych: Mood is stable, no new changes,  All other systems were reviewed with the patient and are negative.  PHYSICAL EXAMINATION: ECOG PERFORMANCE STATUS: 3  Vitals:   01/11/18 1145  BP: (!) 125/92  Pulse: 97  Resp: 17  Temp: 98.5 F (36.9 C)  TempSrc: Oral  SpO2: 98%  Weight: 218 lb (98.9 kg)  Height: 5' 9"  (1.753 m)    GENERAL:alert, no distress and comfortable, in a electric wheelchair  SKIN: skin color, texture, turgor are normal, no rashes or significant lesions EYES: normal, conjunctiva are pink and non-injected, sclera clear except small bleeding in the inner of right sclera  OROPHARYNX:no exudate, no erythema and lips, buccal mucosa, and tongue normal  NECK: supple, thyroid normal size, non-tender, without nodularity LYMPH:  no palpable lymphadenopathy in the cervical, axillary or inguinal LUNGS: clear to auscultation and percussion with normal breathing effort HEART: regular rate & rhythm and no murmurs and no lower extremity edema ABDOMEN:abdomen soft, non-tender and normal bowel sounds Musculoskeletal:no cyanosis of digits and no clubbing  PSYCH: alert & oriented x 3 NEURO: no focal motor/sensory  deficits EXT: The surgical scar at the left knee has healed well. He wares braces for both leg. No pitting edema. CHEST: s/p bilateral mastectomy. He has soft tissue fullness in the left lateral area of the chest wall. There is multiple tiny subcutaneus nodules that resembles lumpy breast tissue.  LABORATORY DATA:  I have reviewed the data as listed CBC Latest Ref Rng & Units 01/11/2018 12/07/2017 09/09/2017  WBC 4.0 - 10.5 K/uL 3.4(L) 4.0 3.7  Hemoglobin 13.0 - 17.0 g/dL 13.0 13.5 12.5(L)  Hematocrit 39.0 - 52.0 % 43.2 42.2 42.0  Platelets 150 - 400 K/uL 194 207 248   CMP Latest Ref Rng & Units 01/11/2018 12/07/2017 09/09/2017  Glucose 70 - 99 mg/dL 134(H) 103(H) 112(H)  BUN 6 - 20 mg/dL 13 16 16   Creatinine 0.61 - 1.24 mg/dL 1.22 1.13 1.12  Sodium 135 - 145 mmol/L 141 138 142  Potassium 3.5 - 5.1 mmol/L 4.0 3.7 4.4  Chloride 98 - 111 mmol/L 104 97 101  CO2 22 - 32 mmol/L 29 25 27   Calcium 8.9 - 10.3 mg/dL 9.3 9.2 8.8  Total Protein 6.5 - 8.1 g/dL 6.7 - 6.4  Total Bilirubin 0.3 - 1.2 mg/dL 0.7 - 0.2  Alkaline Phos 38 - 126 U/L 183(H) - 264(H)  AST 15 - 41 U/L 24 - 24  ALT 0 - 44 U/L 27 - 27     Pathology report Diagnosis Bone Marrow, Aspirate,Biopsy, and Clot, right iliac - SLIGHTLY HYPERCELLULAR BONE MARROW FOR AGE WITH TRILINEAGE HEMATOPOIESIS. - SEE COMMENT. PERIPHERAL BLOOD: - NO SIGNIFICANT MORPHOLOGIC ABNORMALITIES. Diagnosis Note The bone marrow is slightly hypercellular with trilineage hematopoiesis and essentially orderly and progressive maturation of all cell types. Significant dyspoiesis is not present. Iron stores are markedly decreased to absent and hence correlation with serum iron studies is recommended. There is no morphologic evidence of a myeloproliferative process in this material. Correlation with cytogenetic studies is recommended. (BNS:ecj Apr 13, 2014)   RADIOGRAPHIC STUDIES: No results found.  MRI Lumbar Spine WO Contrast 04/16/16 IMPRESSION: 1.  Advanced L5-S1 disc degeneration with mild bilateral neural foraminal stenosis. 2. Shallow left foraminal disc protrusion at L3-4 and mild disc bulging at L4-5 without stenosis. 3. Mild multilevel posterior element hypertrophy  MRI Cervical Spine WO Contrast 04/16/16 IMPRESSION: 1. No significant interval change in size and appearance of syrinx extending from C4 through C6 as above. 2. Mild multilevel degenerative spondylolysis as above without significant canal or foraminal stenosis.   ASSESSMENT & PLAN:  53 y.o. transgender male  51. Secondary Polythythemia JAK2 (-) -I previously reviewed his bone marrow  biopsy results with him in details. He has slightly hypercellular bone marrow with trilineage hematopoiesis.  No morphology evidence of myeloproliferative process. Cytogenetics was normal (no Y chromosome).  JAK2 mutation was negative. -He states he has had high H/H since his 20's. He had Hemoglobin 18.7 hematocrit 53.1 in 04/2013. His epo level was normal.  The biopsy was unremarkable.  His presentation is most consistent with secondary polycythemia, possibly related to testosterone injections. 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 Brigitte Pulse  -We previously discussed that secondary polycythemia is usually benign, risk of thrombosis is relatively low than PV. However he did have history of stroke, and he previously feels fatigued when his hemoglobin is high. He agrees to continue phlebotomy as needed. -Due to his multiple orthopedic surgeries, he has developed mild anemia, has not required phlebotomy lately. -Continue monitor CBC and phlebotomy if hematocrit>47%, due to his secondary polycythemia.  2. History of DVT and stroke, Protein C deficiency  - continue Xarelto indefinitely given his underlying protein C deficiency and history of DVT and stroke. His previous DVT was provoked by foot injury. -Continue Xarelto  indefinitely. Okay to hold for 1-2 days before joint injection by his rheumatologist. -I suggested compression socks when traveling and to be active if he can.  -Patient had persistent low protein C level when he had a stroke, repeated protein C level in 2015 in 2016 here were normal.  3. History of CVA  4. 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 I prefer him to maintain low normal level of testosterone.  -He is on testosterone injection weekly, monitored by PCP   5. RA -He is on Secukinumab.  6. Elevated alkaline phosphatase and transaminitis -He has intermittent mild transaminitis, resolved now. He also has significantly elevated alkaline phosphatase  -His primary care physician has noticed the above change and will follow up on that.  -He will continue to follow his GI for this.   7. Back Pain, knee pain and muscular spasms -He is on morphine 60 mg every 12 hours and oxycodone 108m as needed, and baclofen -He will follow-up with his pain specialist Dr. SNaaman Plummerfor his pain management   8. Mild splenomegaly -His recent abdominal ultrasound showed mild splenomegaly, no lesions in the spleen -He is currently undergoing GI workup to rule out early liver cirrhosis  9.  Gynecomastia  -He has soft tissue fullness in the left lateral area of the chest wall. There is multiple tiny subcutaneus nodules that resembles lumpy breast tissue. However, he s/p bilateral mastectomies so there is not a concern for breast cancer.  -[place] from [place]2017 were benign.  PLAN: -Lab reviewed, no need phlebotomy today -Continue Xarelto indefinitely -Lab in 3 months and phlebotomy if hematocrit more than 47% -lab and Follow-up in 6 months  All questions were answered. The patient knows to call the clinic with any problems, questions or concerns.  I spent 20 minutes  counseling the patient face to face. The total time spent in the appointment was 25 minutes and more than 50% was on counseling.  IDierdre SearlesDweik am acting as scribe for Dr. YTruitt Merle  I have reviewed the above documentation for accuracy and completeness, and I agree with the above.      YTruitt Merle 01/11/2018 11:19 PM

## 2018-01-11 ENCOUNTER — Inpatient Hospital Stay (HOSPITAL_BASED_OUTPATIENT_CLINIC_OR_DEPARTMENT_OTHER): Payer: BLUE CROSS/BLUE SHIELD | Admitting: Hematology

## 2018-01-11 ENCOUNTER — Inpatient Hospital Stay: Payer: BLUE CROSS/BLUE SHIELD | Attending: Hematology

## 2018-01-11 ENCOUNTER — Telehealth: Payer: Self-pay | Admitting: Hematology

## 2018-01-11 ENCOUNTER — Encounter: Payer: Self-pay | Admitting: Hematology

## 2018-01-11 ENCOUNTER — Encounter: Payer: Self-pay | Admitting: Family Medicine

## 2018-01-11 VITALS — BP 125/92 | HR 97 | Temp 98.5°F | Resp 17 | Ht 69.0 in | Wt 218.0 lb

## 2018-01-11 DIAGNOSIS — Z8673 Personal history of transient ischemic attack (TIA), and cerebral infarction without residual deficits: Secondary | ICD-10-CM | POA: Diagnosis not present

## 2018-01-11 DIAGNOSIS — D45 Polycythemia vera: Secondary | ICD-10-CM

## 2018-01-11 DIAGNOSIS — Z789 Other specified health status: Secondary | ICD-10-CM

## 2018-01-11 DIAGNOSIS — Z79899 Other long term (current) drug therapy: Secondary | ICD-10-CM | POA: Diagnosis not present

## 2018-01-11 DIAGNOSIS — Z86718 Personal history of other venous thrombosis and embolism: Secondary | ICD-10-CM | POA: Insufficient documentation

## 2018-01-11 DIAGNOSIS — M069 Rheumatoid arthritis, unspecified: Secondary | ICD-10-CM | POA: Diagnosis not present

## 2018-01-11 DIAGNOSIS — D751 Secondary polycythemia: Secondary | ICD-10-CM

## 2018-01-11 DIAGNOSIS — D6859 Other primary thrombophilia: Secondary | ICD-10-CM | POA: Diagnosis not present

## 2018-01-11 DIAGNOSIS — M545 Low back pain: Secondary | ICD-10-CM

## 2018-01-11 DIAGNOSIS — R161 Splenomegaly, not elsewhere classified: Secondary | ICD-10-CM | POA: Diagnosis not present

## 2018-01-11 DIAGNOSIS — F640 Transsexualism: Secondary | ICD-10-CM

## 2018-01-11 DIAGNOSIS — R748 Abnormal levels of other serum enzymes: Secondary | ICD-10-CM

## 2018-01-11 LAB — CBC WITH DIFFERENTIAL/PLATELET
Abs Immature Granulocytes: 0 10*3/uL (ref 0.00–0.07)
BASOS ABS: 0 10*3/uL (ref 0.0–0.1)
Basophils Relative: 0 %
EOS ABS: 0 10*3/uL (ref 0.0–0.5)
Eosinophils Relative: 0 %
HCT: 43.2 % (ref 39.0–52.0)
HEMOGLOBIN: 13 g/dL (ref 13.0–17.0)
IMMATURE GRANULOCYTES: 0 %
LYMPHS PCT: 24 %
Lymphs Abs: 0.8 10*3/uL (ref 0.7–4.0)
MCH: 23.7 pg — ABNORMAL LOW (ref 26.0–34.0)
MCHC: 30.1 g/dL (ref 30.0–36.0)
MCV: 78.7 fL — ABNORMAL LOW (ref 80.0–100.0)
Monocytes Absolute: 0.3 10*3/uL (ref 0.1–1.0)
Monocytes Relative: 7 %
NEUTROS PCT: 69 %
NRBC: 0 % (ref 0.0–0.2)
Neutro Abs: 2.3 10*3/uL (ref 1.7–7.7)
Platelets: 194 10*3/uL (ref 150–400)
RBC: 5.49 MIL/uL (ref 4.22–5.81)
RDW: 18.2 % — AB (ref 11.5–15.5)
WBC: 3.4 10*3/uL — AB (ref 4.0–10.5)

## 2018-01-11 LAB — COMPREHENSIVE METABOLIC PANEL
ALT: 27 U/L (ref 0–44)
ANION GAP: 8 (ref 5–15)
AST: 24 U/L (ref 15–41)
Albumin: 4.1 g/dL (ref 3.5–5.0)
Alkaline Phosphatase: 183 U/L — ABNORMAL HIGH (ref 38–126)
BUN: 13 mg/dL (ref 6–20)
CHLORIDE: 104 mmol/L (ref 98–111)
CO2: 29 mmol/L (ref 22–32)
CREATININE: 1.22 mg/dL (ref 0.61–1.24)
Calcium: 9.3 mg/dL (ref 8.9–10.3)
Glucose, Bld: 134 mg/dL — ABNORMAL HIGH (ref 70–99)
Potassium: 4 mmol/L (ref 3.5–5.1)
SODIUM: 141 mmol/L (ref 135–145)
Total Bilirubin: 0.7 mg/dL (ref 0.3–1.2)
Total Protein: 6.7 g/dL (ref 6.5–8.1)

## 2018-01-11 NOTE — Telephone Encounter (Signed)
Scheduled appt per 11/11 los - pt is aware of appt date and time.  No print out wanyed per patient request.

## 2018-01-12 ENCOUNTER — Ambulatory Visit (INDEPENDENT_AMBULATORY_CARE_PROVIDER_SITE_OTHER): Payer: BLUE CROSS/BLUE SHIELD | Admitting: Psychology

## 2018-01-12 ENCOUNTER — Encounter: Payer: Self-pay | Admitting: Hematology

## 2018-01-12 DIAGNOSIS — F4312 Post-traumatic stress disorder, chronic: Secondary | ICD-10-CM | POA: Diagnosis not present

## 2018-01-13 ENCOUNTER — Other Ambulatory Visit: Payer: Self-pay | Admitting: Urology

## 2018-01-13 ENCOUNTER — Ambulatory Visit
Admission: RE | Admit: 2018-01-13 | Discharge: 2018-01-13 | Disposition: A | Discharge status: Home / Self Care | Payer: BLUE CROSS/BLUE SHIELD | Source: Ambulatory Visit | Attending: Urology | Admitting: Urology

## 2018-01-13 DIAGNOSIS — N135 Crossing vessel and stricture of ureter without hydronephrosis: Secondary | ICD-10-CM | POA: Diagnosis not present

## 2018-01-13 DIAGNOSIS — K838 Other specified diseases of biliary tract: Secondary | ICD-10-CM | POA: Diagnosis not present

## 2018-01-13 DIAGNOSIS — Z9049 Acquired absence of other specified parts of digestive tract: Secondary | ICD-10-CM | POA: Diagnosis not present

## 2018-01-13 DIAGNOSIS — N1330 Unspecified hydronephrosis: Secondary | ICD-10-CM

## 2018-01-13 DIAGNOSIS — N200 Calculus of kidney: Secondary | ICD-10-CM | POA: Insufficient documentation

## 2018-01-13 MED ORDER — IOPAMIDOL (ISOVUE-300) INJECTION 61%
125.0000 mL | Freq: Once | INTRAVENOUS | Status: AC | PRN
  Administered 2018-01-13: 125 mL via INTRAVENOUS

## 2018-01-13 NOTE — Progress Notes (Signed)
I have placed orders for a renal lasix scan and he will need a follow up appointment with Dr. Erlene Quan for results and to discuss surgery.

## 2018-01-14 ENCOUNTER — Encounter: Payer: Self-pay | Admitting: Family Medicine

## 2018-01-14 ENCOUNTER — Ambulatory Visit (INDEPENDENT_AMBULATORY_CARE_PROVIDER_SITE_OTHER): Payer: BLUE CROSS/BLUE SHIELD | Admitting: Psychology

## 2018-01-14 DIAGNOSIS — F4310 Post-traumatic stress disorder, unspecified: Secondary | ICD-10-CM

## 2018-01-18 ENCOUNTER — Telehealth: Payer: Self-pay | Admitting: Urology

## 2018-01-18 NOTE — Telephone Encounter (Signed)
I have left a message for him to cb to move his follow up app to dr. Sinclair Grooms

## 2018-01-19 ENCOUNTER — Ambulatory Visit (INDEPENDENT_AMBULATORY_CARE_PROVIDER_SITE_OTHER): Payer: BLUE CROSS/BLUE SHIELD | Admitting: Psychology

## 2018-01-19 DIAGNOSIS — F4312 Post-traumatic stress disorder, chronic: Secondary | ICD-10-CM | POA: Diagnosis not present

## 2018-01-20 NOTE — Telephone Encounter (Deleted)
Copied from [place] [phone]. Topic: General - Call Back - No Documentation >> Jan 20, 2018  4:53 PM Virl Axe D wrote: Reason for CRM: Pt called to follow up about Dr. Brigitte Pulse stating she would send in a Rx to help with the constipation. Please advise. JK#[phone]  [id number] [place] [place] - [phone](Phone) [phone](Fax)

## 2018-01-21 ENCOUNTER — Ambulatory Visit: Payer: BLUE CROSS/BLUE SHIELD | Admitting: Psychology

## 2018-01-22 ENCOUNTER — Encounter: Payer: Self-pay | Admitting: Urology

## 2018-01-23 ENCOUNTER — Encounter: Payer: Self-pay | Admitting: Urology

## 2018-01-26 ENCOUNTER — Encounter: Payer: Self-pay | Admitting: Family Medicine

## 2018-01-26 ENCOUNTER — Ambulatory Visit
Admission: RE | Admit: 2018-01-26 | Discharge: 2018-01-26 | Disposition: A | Discharge status: Home / Self Care | Payer: BLUE CROSS/BLUE SHIELD | Source: Ambulatory Visit | Attending: Urology | Admitting: Urology

## 2018-01-26 ENCOUNTER — Ambulatory Visit: Payer: BLUE CROSS/BLUE SHIELD | Admitting: Psychology

## 2018-01-26 ENCOUNTER — Other Ambulatory Visit: Payer: Self-pay | Admitting: Family Medicine

## 2018-01-26 DIAGNOSIS — N1330 Unspecified hydronephrosis: Secondary | ICD-10-CM | POA: Insufficient documentation

## 2018-01-26 MED ORDER — TESTOSTERONE ENANTHATE 200 MG/ML IM SOLN: 100.0000 mg | INTRAMUSCULAR | 0 refills | Status: DC

## 2018-01-26 MED ORDER — TECHNETIUM TC 99M MERTIATIDE
5.3710 | Freq: Once | INTRAVENOUS | Status: AC | PRN
  Administered 2018-01-26: 5.371 via INTRAVENOUS

## 2018-01-26 MED ORDER — FUROSEMIDE 10 MG/ML IJ SOLN
49.5000 mg | Freq: Once | INTRAMUSCULAR | Status: AC
  Administered 2018-01-26: 49.5 mg via INTRAVENOUS
  Filled 2018-01-26: qty 5

## 2018-01-26 NOTE — Telephone Encounter (Signed)
Will route to office for clarification of orders; notified Felicia at [facility], that this request will be routed to clinical.    testosterone cypionate (DEPOTESTOSTERONE CYPIONATE) 200 MG/ML injection       Changed from: testosterone enanthate (DELATESTRYL) 200 MG/ML injection   Sig: Please specify directions, refills and quantity   Disp:  1 mL  Refills:  0   Start: 01/26/2018   Class: Normal   Last ordered: Today by Shawnee Knapp, MD    Pharmacy comment: Alternative Requested:[facility]NOT GET TESTOSTERONE ENANTHATE, CHANGE TO CYPIONATE? PATIENT HAS BEEN ON CYPIONATE IN THE PAST.   Off-Protocol Failed11/26 7:26 AM  Medication not assigned to a protocol, review manually.   Valid encounter within last 12 months  Protocol Details  This request has changes from the previous prescription.  [id number] [place] [facility] [address]

## 2018-01-27 ENCOUNTER — Other Ambulatory Visit: Payer: Self-pay | Admitting: *Deleted

## 2018-01-27 DIAGNOSIS — M5416 Radiculopathy, lumbar region: Secondary | ICD-10-CM

## 2018-01-27 DIAGNOSIS — L4050 Arthropathic psoriasis, unspecified: Secondary | ICD-10-CM

## 2018-01-27 NOTE — Telephone Encounter (Signed)
[facility] Dr [name] had already sent a prescription for the Morphine, they agreed. Mr. Schwager aware of the above.

## 2018-01-27 NOTE — Telephone Encounter (Signed)
Jesse Bowers has an appointment 02/02/18 but will be out of his morphine sulfate before the appt.  It was last filled per PMP on 12/30/17.  He has oxycodone.

## 2018-01-27 NOTE — Telephone Encounter (Signed)
Notified Mr Kiener.

## 2018-02-01 ENCOUNTER — Encounter: Payer: Self-pay | Admitting: Urology

## 2018-02-02 ENCOUNTER — Ambulatory Visit: Payer: BLUE CROSS/BLUE SHIELD | Admitting: Urology

## 2018-02-02 ENCOUNTER — Encounter
Payer: BLUE CROSS/BLUE SHIELD | Attending: Physical Medicine & Rehabilitation | Admitting: Physical Medicine & Rehabilitation

## 2018-02-02 ENCOUNTER — Other Ambulatory Visit: Payer: Self-pay

## 2018-02-02 ENCOUNTER — Encounter: Payer: Self-pay | Admitting: Physical Medicine & Rehabilitation

## 2018-02-02 ENCOUNTER — Ambulatory Visit: Payer: BLUE CROSS/BLUE SHIELD | Admitting: Psychology

## 2018-02-02 VITALS — BP 122/81 | HR 112 | Ht 69.0 in | Wt 218.0 lb

## 2018-02-02 DIAGNOSIS — L4050 Arthropathic psoriasis, unspecified: Secondary | ICD-10-CM | POA: Diagnosis not present

## 2018-02-02 DIAGNOSIS — I69398 Other sequelae of cerebral infarction: Secondary | ICD-10-CM | POA: Insufficient documentation

## 2018-02-02 DIAGNOSIS — I6782 Cerebral ischemia: Secondary | ICD-10-CM | POA: Diagnosis not present

## 2018-02-02 DIAGNOSIS — Z7901 Long term (current) use of anticoagulants: Secondary | ICD-10-CM | POA: Insufficient documentation

## 2018-02-02 DIAGNOSIS — M5416 Radiculopathy, lumbar region: Secondary | ICD-10-CM

## 2018-02-02 DIAGNOSIS — Z79899 Other long term (current) drug therapy: Secondary | ICD-10-CM | POA: Insufficient documentation

## 2018-02-02 DIAGNOSIS — G894 Chronic pain syndrome: Secondary | ICD-10-CM

## 2018-02-02 DIAGNOSIS — G8929 Other chronic pain: Secondary | ICD-10-CM | POA: Diagnosis present

## 2018-02-02 DIAGNOSIS — D751 Secondary polycythemia: Secondary | ICD-10-CM | POA: Insufficient documentation

## 2018-02-02 DIAGNOSIS — D6859 Other primary thrombophilia: Secondary | ICD-10-CM | POA: Diagnosis not present

## 2018-02-02 DIAGNOSIS — S43002A Unspecified subluxation of left shoulder joint, initial encounter: Secondary | ICD-10-CM | POA: Insufficient documentation

## 2018-02-02 DIAGNOSIS — T402X5A Adverse effect of other opioids, initial encounter: Secondary | ICD-10-CM

## 2018-02-02 DIAGNOSIS — F418 Other specified anxiety disorders: Secondary | ICD-10-CM | POA: Insufficient documentation

## 2018-02-02 DIAGNOSIS — M545 Low back pain: Secondary | ICD-10-CM | POA: Diagnosis not present

## 2018-02-02 DIAGNOSIS — M13862 Other specified arthritis, left knee: Secondary | ICD-10-CM | POA: Diagnosis not present

## 2018-02-02 DIAGNOSIS — K5903 Drug induced constipation: Secondary | ICD-10-CM

## 2018-02-02 DIAGNOSIS — Z86718 Personal history of other venous thrombosis and embolism: Secondary | ICD-10-CM | POA: Diagnosis not present

## 2018-02-02 DIAGNOSIS — Z9181 History of falling: Secondary | ICD-10-CM | POA: Diagnosis not present

## 2018-02-02 DIAGNOSIS — M7522 Bicipital tendinitis, left shoulder: Secondary | ICD-10-CM | POA: Diagnosis not present

## 2018-02-02 DIAGNOSIS — K900 Celiac disease: Secondary | ICD-10-CM | POA: Diagnosis not present

## 2018-02-02 DIAGNOSIS — I69320 Aphasia following cerebral infarction: Secondary | ICD-10-CM | POA: Insufficient documentation

## 2018-02-02 DIAGNOSIS — Z79891 Long term (current) use of opiate analgesic: Secondary | ICD-10-CM | POA: Insufficient documentation

## 2018-02-02 DIAGNOSIS — F1114 Opioid abuse with opioid-induced mood disorder: Secondary | ICD-10-CM

## 2018-02-02 DIAGNOSIS — Z87890 Personal history of sex reassignment: Secondary | ICD-10-CM | POA: Diagnosis not present

## 2018-02-02 DIAGNOSIS — I69351 Hemiplegia and hemiparesis following cerebral infarction affecting right dominant side: Secondary | ICD-10-CM | POA: Insufficient documentation

## 2018-02-02 DIAGNOSIS — R209 Unspecified disturbances of skin sensation: Secondary | ICD-10-CM | POA: Insufficient documentation

## 2018-02-02 MED ORDER — MORPHINE SULFATE ER 60 MG PO CP24: 60.0000 mg | ORAL_CAPSULE | Freq: Two times a day (BID) | ORAL | 0 refills | Status: DC

## 2018-02-02 MED ORDER — OXYCODONE HCL 10 MG PO TABS: 10.0000 mg | ORAL_TABLET | Freq: Two times a day (BID) | ORAL | 0 refills | Status: DC | PRN

## 2018-02-02 MED ORDER — DICLOFENAC SODIUM 1 % TD GEL: 1.0000 "application " | Freq: Three times a day (TID) | TRANSDERMAL | 4 refills | Status: DC

## 2018-02-02 MED ORDER — METHYLNALTREXONE BROMIDE 150 MG PO TABS: 1.0000 | ORAL_TABLET | Freq: Every morning | ORAL | 4 refills | Status: DC

## 2018-02-02 NOTE — Progress Notes (Signed)
Subjective:    Patient ID: Jesse Bowers, adult    DOB: [birth year], 52 y.o.   MRN: [id number]  HPI   Jesse Bowers is here in follow-up of his chronic pain as well as his functional deficits.  At his last visit we ordered an MRI of the brain given some of the visual changes he was experiencing.  I reviewed his MRI which did not show any acute findings but demonstrated chronic ischemic changes in the bilateral cerebral white matter, most particularly in the anterior frontal lobes,  consistent with areas seen on scan from 2017.  He's had some difficulties with bladder emptying issues and reports hydronephrosis and ureteral obstruction which will require stenting.   He has ophthalmology follow up Wednesday. He is still having floaters and occasional flashing light in his eyes.   He is wearing hearing aids for conductive hearing loss.   Constipation has become an increasing problem. He has tried miralax and dulcolax,senna, colace without success.    Pain Inventory Average Pain 9 Pain Right Now 9 My pain is intermittent, sharp, dull and aching  In the last 24 hours, has pain interfered with the following? General activity 8 Relation with others 9 Enjoyment of life 10 What TIME of day is your pain at its worst? all Sleep (in general) Poor  Pain is worse with: walking, bending, sitting, standing and some activites Pain improves with: rest, heat/ice, pacing activities, medication, TENS and injections Relief from Meds: 5  Mobility walk with assistance use a walker how many minutes can you walk? 10 ability to climb steps?  yes do you drive?  yes use a wheelchair transfers alone  Function disabled: date disabled 01/2013 I need assistance with the following:  feeding, dressing, meal prep, household duties and shopping  Neuro/Psych bladder control problems weakness numbness tremor trouble walking spasms dizziness anxiety loss of taste or smell  Prior Studies Any changes since  last visit?  yes  Physicians involved in your care Any changes since last visit?  yes Urologist- Dr. Erlene Bowers   Family History  Problem Relation Age of Onset  . Stroke Maternal Grandfather        90  . Heart attack Maternal Grandfather   . Hypertension Mother   . Psoriasis Mother   . Other Mother        meningioma developed ~2019  . Cancer Paternal Grandfather   . Heart attack Paternal Grandfather   . Stroke Paternal Uncle        age 88  . Polycythemia Paternal Uncle   . Stroke Maternal Grandmother   . Congestive Heart Failure Maternal Grandmother   . Heart attack Maternal Grandmother   . Protein C deficiency Sister 23       Miscarriages   Social History   Socioeconomic History  . Marital status: Married    Spouse name: Not on file  . Number of children: 2  . Years of education: 4y college  . Highest education level: Not on file  Occupational History  . Occupation: Pediatric Nurse practitioner    Comment: Not working since [place] 2015  Social Needs  . Financial resource strain: Not on file  . Food insecurity:    Worry: Not on file    Inability: Not on file  . Transportation needs:    Medical: Not on file    Non-medical: Not on file  Tobacco Use  . Smoking status: Never Smoker  . Smokeless tobacco: Never Used  Substance and Sexual Activity  .  Alcohol use: No    Frequency: Never  . Drug use: No  . Sexual activity: Yes    Birth control/protection: None    Comment: patient is a transgender on testosterone shots, no biological kids  Lifestyle  . Physical activity:    Days per week: Not on file    Minutes per session: Not on file  . Stress: Not on file  Relationships  . Social connections:    Talks on phone: Not on file    Gets together: Not on file    Attends religious service: Not on file    Active member of club or organization: Not on file    Attends meetings of clubs or organizations: Not on file    Relationship status: Not on file  Other Topics Concern   . Not on file  Social History Narrative   Education 4 year college, former Therapist, sports X 15 years, pediatric nurse practitioner x 6 years, did NP degree from [place] of [place]. Relocated to [place] about 2 months ago from [place], [place]. Patient was in [place] for last 4 years and prior to that in [place]. His wife is working as Scientist, research (physical sciences) for [employer]. Patient is not working and applying for disability. They have 2 kids but no biologic children.    Past Surgical History:  Procedure Laterality Date  . ABDOMINAL HYSTERECTOMY Bilateral 1994   TAH, BSO- tranverse incision at 52 yo  . ANKLE ARTHROSCOPY WITH RECONSTRUCTION Right 2007  . CHOLECYSTECTOMY     laparoscopic  . HIP ARTHROSCOPY W/ LABRAL REPAIR Right 05/11/2013   acetabular labral tear 03/30/2013  . KNEE SURGERY Bilateral 1984   Right ACL, left PCL repair  . LITHOTRIPSY  2005  . LIVER BIOPSY  2013   normal results.  [name] MASTECTOMY Bilateral    prior to 2009  . NASAL SEPTUM SURGERY N/A 09/20/2015   by ENT Dr. Lucia Bowers  . OVARIAN CYST SURGERY Left    size of grapefruit, was informed that she had shortened vagina  . THYROIDECTOMY, PARTIAL Left 2008  . TOTAL KNEE REVISION Left 02/06/2016   Procedure: LEFT TOTAL KNEE REVISION;  Surgeon: Jesse Arabian, MD;  Location: [facility];  Service: Orthopedics;  Laterality: Left;  . TOTAL KNEE REVISION Left 04/22/2017   Procedure: Left knee polyethylene revision;  Surgeon: Jesse Arabian, MD;  Location: [facility];  Service: Orthopedics;  Laterality: Left;  . UPPER GI ENDOSCOPY  2003   Past Medical History:  Diagnosis Date  . Abnormal weight loss   . Anxiety    PTSD per patient  . Arthritis   . Celiac disease   . Cervical neck pain with evidence of disc disease    patient has a cyst   . Degenerative disc disease at L5-S1 level    with stenosis  . 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 Family history of adverse reaction to anesthesia    family has problems with anesthesia of nausea and  vomiting   . 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 Gluten enteropathy   . H/O parotitis    right   . History of kidney stones   . Hx-TIA (transient ischemic attack)    2015  . Neck pain   . Neuromuscular disorder ([place])    bilateral neuropathy feet.  . Pneumonia 12/17/2010  . Polycythemia   . Polycythemia, secondary   . PONV (postoperative nausea and vomiting)   . Protein C deficiency ([place])    Dr. Anne Fu  . Psoriasis   .  psoriatic arthritis   . Scaphoid fracture of wrist 09/23/2013  . Sleep apnea    split night study last done by Dr. [name] 06/18/15 shows severe OSA, CSA, and hypersomnia, rec bipap  . Stenosis of ureteropelvic junction (UPJ)    left  . Stroke [facility])    CVA vs TIA in left cerebrum causing slight right sided weakness-Dr. [name] follows  . 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 Tachycardia    hx of   . Transfusion history    past history- none recent   BP 122/81   Pulse (!) 112   Ht 5' 9"  (1.753 m) Comment: pt reported, in wheelchair  Wt 218 lb (98.9 kg) Comment: pt reported, in wheelchair  SpO2 91%   BMI 32.19 kg/m    Opioid Risk Score:   Fall Risk Score:  `1  Depression screen PHQ 2/9  Depression screen [facility] 2/9 02/02/2018 09/23/2017 08/07/2017 06/24/2017 02/18/2017 11/10/2016 09/15/2016  Decreased Interest 0 0 3 0 1 0 0  Down, Depressed, Hopeless 0 0 3 0 1 0 0  PHQ - 2 Score 0 0 6 0 2 0 0  Altered sleeping - - - 0 - - -  Tired, decreased energy - - - 0 - - -  Change in appetite - - - 0 - - -  Feeling bad or failure about yourself  - - - 0 - - -  Trouble concentrating - - - 0 - - -  Moving slowly or fidgety/restless - - - 0 - - -  Suicidal thoughts - - - 0 - - -  PHQ-9 Score - - - 0 - - -  Some recent data might be hidden    Review of Systems  Constitutional: Positive for appetite change, fever and unexpected weight change.  HENT: Negative.   Respiratory: Positive for apnea.   Cardiovascular: Negative.   Gastrointestinal:  Positive for constipation and nausea.  Endocrine: Negative.   Genitourinary: Positive for dysuria.  Musculoskeletal: Negative.   Skin: Negative.   Allergic/Immunologic: Negative.   Neurological: Negative.   Hematological: Negative.   Psychiatric/Behavioral: Negative.   All other systems reviewed and are negative.      Objective:   Physical Exam General: No acute distress HEENT: EOMI, oral membranes moist Cards: reg rate  Chest: normal effort Abdomen: Soft, NT, ND Skin: dry, intact Extremities: no edema Neuro:Pt is pleasant and alert and oriented x 3. Minimalright facial weakness and sensory loss is appreciated. UE and LE motor not tested. Reflexes remain hyperactive.patchy sensory loss in limbs.  Musculoskeletal:pain with shoulder IR/ER but has functional use of both limbs. Psych: pleasant and cooperative   Assessment & Plan:  1. Psoriatic arthritis with pain in multiple areas, most prominently feet, hands, elbows.Hispain is also related to his prior CVA and associated motor/sensory changepatient's complaining of increased s/spasticity. 2. 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  3. Hx of left total knee revisionagain on 04/21/17 per [facility] 4. Chronic low back pain---MRI withsevere DDD at L5-S1. Left S1 radiculopathy? S/p LEFT L5-S1 ESI 5. Protein C deficiency  6. Left shoulder subluxation, bicipital tendonitis  7. Central sleep apnea  8. Polycythemia  9. Depression with anxiety. 10. Left lateral epidondylitis 11. 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 Most recent cervical MRI (07/08/17) without significant change 12. 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 [facility]  Plan:  1.Continue effexor 330m xr daily 2.MaintainHEPas possible. Discussed alternatives for braces.  ?lateral support leftAFO.                   -AFO per [facility] 3.Voltaren gel to hands, feet, knees, elbows---refilled today 4.Baclofen for lower extermity spasms/back pain.20 mg 4 times daily #120. Continue current rx. Myrbetriq for bladder spasms  5. Refilled kadian [id number] #[id number] oxycodone 109m50 -We will continue the controlled substance monitoring program, this consists of regular clinic visits, examinations, routine drug screening, pill counts as well [place] use [place].  . -Medication was refilled and a second prescription was sent to the patient's pharmacy for next month.  (second only for kadian) 6.off pamelor due to dry mouth. 7.Has had goodresults with left L5-S1 translaminar injection per Dr. KirGeorge Hugha 3rd injection perhaps next year. Continue with PT,aquatics as he's doing 8. Reviewed brain MRI with patient 9. Trial of of relistor for constipaiton, 1-3 tablets qam  67m23mes of face to face patient care time were spent during this visit.follow-up withmein about 2mon65monthme.  All questions were encouraged and answered.

## 2018-02-02 NOTE — Telephone Encounter (Signed)
Please let pt know that his pharmacy states that they cannot get the T. Enanthate that he wanted to try (as was hoping he could get in larger the 1 mL vials.)  See if he wants to call around to see if another pharmacy can get the T. Enanthate or if he just wants to stay on the cypionate and hope the 1m vials come in soon. . . .

## 2018-02-02 NOTE — Patient Instructions (Signed)
PLEASE FEEL FREE TO CALL OUR OFFICE WITH ANY PROBLEMS OR QUESTIONS ([phone])      

## 2018-02-03 ENCOUNTER — Encounter: Payer: Self-pay | Admitting: Urology

## 2018-02-04 ENCOUNTER — Ambulatory Visit: Payer: BLUE CROSS/BLUE SHIELD | Admitting: Psychology

## 2018-02-05 ENCOUNTER — Encounter: Payer: Self-pay | Admitting: Urology

## 2018-02-05 ENCOUNTER — Other Ambulatory Visit: Payer: Self-pay

## 2018-02-05 ENCOUNTER — Ambulatory Visit (INDEPENDENT_AMBULATORY_CARE_PROVIDER_SITE_OTHER): Payer: BLUE CROSS/BLUE SHIELD | Admitting: Urology

## 2018-02-05 ENCOUNTER — Ambulatory Visit: Payer: BLUE CROSS/BLUE SHIELD | Admitting: Urology

## 2018-02-05 VITALS — BP 132/94 | HR 114 | Wt 218.0 lb

## 2018-02-05 DIAGNOSIS — R350 Frequency of micturition: Secondary | ICD-10-CM

## 2018-02-05 NOTE — Progress Notes (Unsigned)
02/05/2018 8:44 AM   Jesse [name] Bowers [id number]  Referring provider: Concepcion Elk, MD [address] [place], [place] [address]  Chief Complaint  Patient presents with  . Consult    HPI: 52 year old male who presents today for follow-up of Lasix renogram discussed management options for left UPJ obstruction.  Patient has known mild chronic left hydronephrosis dating back to 2015 which is been stable.  More recently, he underwent a renal ultrasound followed by a CT hematuria protocol scan followed by Lasix renogram for further evaluation of this.  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  Lasix renogram performed on 01/27/2018 shows split function with ***   [name] reports today that he has chronic pain in general.  He does feel like it this lateralizes to his left flank which is exacerbated by voiding lasting about 30 minutes with each trip to the bathroom.  He denies any exacerbation with large fluid intake.  This is been particularly worse over the past 6 months.  He does describe an episode of a significant fall about 6 months ago when his symptoms began to acutely worsen.  He also describes chronic bladder issues.  He reports that he has urinary frequency, urgency, slow stream, difficulty emptying his bladder, and issues with returning to the bathroom within 10 to 15 minutes of voiding initially.   PMH: Past Medical History:  Diagnosis Date  . Abnormal weight loss   . Anxiety    PTSD per patient  . Arthritis   . Celiac disease   . Cervical neck pain with evidence of disc disease    patient has a cyst   . Degenerative disc disease at L5-S1 level    with stenosis  . 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 Family history of adverse reaction to anesthesia    family has problems with anesthesia of nausea and vomiting   . 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 Gluten enteropathy   . H/O parotitis    right   . History of kidney stones   . Hx-TIA (transient ischemic attack)    2015  . Neck pain   . Neuromuscular disorder ([place])    bilateral neuropathy feet.  . Pneumonia 12/17/2010  . Polycythemia   . Polycythemia, secondary   . PONV (postoperative nausea and vomiting)   . Protein C deficiency ([place])    Dr. Anne Fu  . Psoriasis   . psoriatic arthritis   . Scaphoid fracture of wrist 09/23/2013  . Sleep apnea    split night study last done by Dr. [name] 06/18/15 shows severe OSA, CSA, and hypersomnia, rec bipap  . Stenosis of ureteropelvic junction (UPJ)    left  . 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 [name] follows  . 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 Tachycardia    hx of   . Transfusion history    past history- none recent    Surgical History: Past Surgical History:  Procedure Laterality Date  . ABDOMINAL HYSTERECTOMY Bilateral 1994   TAH, BSO- tranverse incision at 51 yo  . ANKLE ARTHROSCOPY WITH RECONSTRUCTION Right 2007  . CHOLECYSTECTOMY     laparoscopic  . HIP ARTHROSCOPY W/ LABRAL REPAIR Right 05/11/2013   acetabular labral tear 03/30/2013  . KNEE SURGERY Bilateral 1984   Right ACL, left PCL repair  . LITHOTRIPSY  2005  .  LIVER BIOPSY  2013   normal results.  [name] MASTECTOMY Bilateral    prior to 2009  . NASAL SEPTUM SURGERY N/A 09/20/2015   by ENT Dr. [name]  . OVARIAN CYST SURGERY Left    size of grapefruit, was informed that she had shortened vagina  . THYROIDECTOMY, PARTIAL Left 2008  . TOTAL KNEE REVISION Left 02/06/2016   Procedure: LEFT TOTAL KNEE REVISION;  Surgeon: [name], MD;  Location: [facility];  Service: Orthopedics;  Laterality: Left;  . TOTAL KNEE REVISION Left 04/22/2017   Procedure: Left knee polyethylene revision;  Surgeon: [name], MD;  Location: [facility];  Service: Orthopedics;  Laterality: Left;  . UPPER GI ENDOSCOPY  2003    Home Medications:  Allergies  as of 02/05/2018      Reactions   Penicillin G Anaphylaxis, Other (See Comments)   Has patient had a PCN reaction causing immediate rash, facial/tongue/throat swelling, SOB or lightheadedness with hypotension: Yes Has patient had a PCN reaction causing severe rash involving mucus membranes or skin necrosis: No Has patient had a PCN reaction that required hospitalization Yes Has patient had a PCN reaction occurring within the last 10 years: No If all of the above answers are "NO", then may proceed with Cephalosporin use.   Sulfa Antibiotics Rash   Stevens-Johnson rash   Vancomycin Rash, Other (See Comments)   RED MAN SYNDROME CAN HAVE IF GIVEN OVER 2HOURS   Wheat Extract Other (See Comments)   CELIAC DISEASE CELIAC DISEASE   Duloxetine Other (See Comments)   Restless legs   Gabapentin Nausea Only, Other (See Comments)   Other reaction(s): nausea, mental status Drowsiness and restlessness.   Tegaderm Ag Mesh [silver] Dermatitis   Causes blistering wounds   Citalopram Other (See Comments)   Dystonia   Acetaminophen Other (See Comments)   Elevates liver enzymes.    Ibuprofen Other (See Comments)   Contraindicated with Xarelto.    Nortriptyline Other (See Comments)   Dry mouth at 25 mg dose.  Tolerates 10 mg dose   Pregabalin Other (See Comments)   Ineffective   Sulfacetamide Sodium-sulfur Rash      Medication List        Accurate as of 02/05/18  8:44 AM. Always use your most recent med list.          acyclovir ointment 5 % Commonly known as:  ZOVIRAX APPLY TO AFFECTED AREA EVERY DAY AS NEEDED   baclofen 20 MG tablet Commonly known as:  LIORESAL Take 1 tablet (20 mg total) by mouth 4 (four) times daily.   betamethasone dipropionate 0.05 % cream Commonly known as:  DIPROLENE Apply topically 2 (two) times daily.   clorazepate 7.5 MG tablet Commonly known as:  TRANXENE Take 7.5 mg by mouth 2 (two) times daily.   clorazepate 15 MG tablet Commonly known as:   TRANXENE Take 15 mg by mouth at bedtime.   COSENTYX [place] Inject into the skin.   desonide 0.05 % ointment Commonly known as:  DESOWEN APPLY TO AFFECTED AREAS TWICE DAILY AS NEEDED FOR PSORIASIS.   diclofenac sodium 1 % Gel Commonly known as:  VOLTAREN Apply 1 application topically 3 (three) times daily. Bilateral hands   ENSTILAR 0.005-0.064 % Foam Generic drug:  Calcipotriene-Betameth Diprop Apply 1 application topically 2 (two) times daily.   furosemide 40 MG tablet Commonly known as:  LASIX Take 1 tablet (40 mg total) by mouth daily. Take 1 extra tab daily prn for a weight gain of 3lbs  in a day or 5 lbs per week.   furosemide 20 MG tablet Commonly known as:  LASIX TAKE 1 TAB BY MOUTH DAILY AS  NEEDED ,THIS IN ADDITION TABLET WITH 40  MG TABLET  AS NEEDED FOR WEIGHT GAIN OF 3LBS IN 1DAY OR 5LBS/WEEK   Methylnaltrexone Bromide 150 MG Tabs Take 1-3 tablets by mouth every morning.   mirabegron ER 25 MG Tb24 tablet Commonly known as:  MYRBETRIQ Take 1 tablet (25 mg total) by mouth daily.   morphine 60 MG 24 hr capsule Commonly known as:  KADIAN Take 1 capsule (60 mg total) by mouth every 12 (twelve) hours.   NEEDLE (DISP) 18 G 18G X 1-1/2" Misc 1 Units by Does not apply route once a week.   NEEDLE (DISP) 23 G 23G X 3/4" Misc 1 Units by Does not apply route once a week.   neomycin-polymyxin-hydrocortisone 3.5-10000-1 OTIC suspension Commonly known as:  CORTISPORIN PLACE 4 DROPS INTO LEFT EAR TWICE A DAY AS NEEDED FOR PAIN AND DRAINAGE   NONFORMULARY OR COMPOUNDED ITEM Apply 1 application topically 3 (three) times daily. 8% ketamine, 5% amitriptyline, 5% baclofen, 5% gabapentin  60 GM   Oxycodone HCl 10 MG Tabs Take 1 tablet (10 mg total) by mouth every 12 (twelve) hours as needed ((score 7 to 10)).   potassium chloride 10 MEQ tablet Commonly known as:  K-DUR Take 1 tablet (10 mEq total) by mouth 2 (two) times daily. May take an extra tablet daily if extra lasix is  taken.   prazosin 1 MG capsule Commonly known as:  MINIPRESS Take 1 mg by mouth at bedtime. Takes 1 mg and 2 mg to equal 3 mg at bedtime   prazosin 2 MG capsule Commonly known as:  MINIPRESS Take 2 mg by mouth at bedtime. Takes 1 mg and 2 mg to equal 3 mg at bedtime   rivaroxaban 20 MG Tabs tablet Commonly known as:  XARELTO TAKE 1 TABLET DAILY BEFORE BEDTIME   Syringe (Disposable) 1 ML Misc 1 Units by Does not apply route once a week.   testosterone enanthate 200 MG/ML injection Commonly known as:  DELATESTRYL Inject 0.5 mLs (100 mg total) into the muscle every 7 (seven) days. For IM use only   ursodiol 500 MG tablet Commonly known as:  ACTIGALL Take 500 mg by mouth 2 (two) times daily.   ursodiol 250 MG tablet Commonly known as:  ACTIGALL Take 250 mg by mouth 2 (two) times daily.   venlafaxine XR 150 MG 24 hr capsule Commonly known as:  EFFEXOR-XR Take 2 capsules (300 mg total) by mouth daily with breakfast.       Allergies:  Allergies  Allergen Reactions  . Penicillin G Anaphylaxis and Other (See Comments)    Has patient had a PCN reaction causing immediate rash, facial/tongue/throat swelling, SOB or lightheadedness with hypotension: Yes Has patient had a PCN reaction causing severe rash involving mucus membranes or skin necrosis: No Has patient had a PCN reaction that required hospitalization Yes Has patient had a PCN reaction occurring within the last 10 years: No If all of the above answers are "NO", then may proceed with Cephalosporin use.   . Sulfa Antibiotics Rash    Stevens-Johnson rash  . Vancomycin Rash and Other (See Comments)    RED MAN SYNDROME CAN HAVE IF GIVEN OVER 2HOURS  . Wheat Extract Other (See Comments)    CELIAC DISEASE CELIAC DISEASE  . Duloxetine Other (See Comments)    Restless  legs  . Gabapentin Nausea Only and Other (See Comments)    Other reaction(s): nausea, mental status Drowsiness and restlessness.  . Tegaderm Ag Mesh  [Silver] Dermatitis    Causes blistering wounds   . Citalopram Other (See Comments)    Dystonia  . Acetaminophen Other (See Comments)    Elevates liver enzymes.   . Ibuprofen Other (See Comments)    Contraindicated with Xarelto.   . Nortriptyline Other (See Comments)    Dry mouth at 25 mg dose.  Tolerates 10 mg dose  . Pregabalin Other (See Comments)    Ineffective  . Sulfacetamide Sodium-Sulfur Rash    Family History: Family History  Problem Relation Age of Onset  . Stroke Maternal Grandfather        87  . Heart attack Maternal Grandfather   . Hypertension Mother   . Psoriasis Mother   . Other Mother        meningioma developed ~2019  . Cancer Paternal Grandfather   . Heart attack Paternal Grandfather   . Stroke Paternal Uncle        age 72  . Polycythemia Paternal Uncle   . Stroke Maternal Grandmother   . Congestive Heart Failure Maternal Grandmother   . Heart attack Maternal Grandmother   . Protein C deficiency Sister 51       Miscarriages    Social History:  reports that he has never smoked. He has never used smokeless tobacco. He reports that he does not drink alcohol or use drugs.  ROS:                                        Physical Exam: BP (!) 132/94   Pulse (!) 114   Wt 218 lb (98.9 kg)   BMI 32.19 kg/m   Constitutional:  Alert and oriented, No acute distress. HEENT: [place] AT, moist mucus membranes.  Trachea midline, no masses. Cardiovascular: No clubbing, cyanosis, or edema. Respiratory: Normal respiratory effort, no increased work of breathing. GI: Abdomen is soft, nontender, nondistended, no abdominal masses GU: No CVA tenderness Lymph: No cervical or inguinal lymphadenopathy. Skin: No rashes, bruises or suspicious lesions. Neurologic: Grossly intact, no focal deficits, moving all 4 extremities. Psychiatric: Normal mood and affect.  Laboratory Data: Lab Results  Component Value Date   WBC 3.4 (L) 01/11/2018   HGB 13.0  01/11/2018   HCT 43.2 01/11/2018   MCV 78.7 (L) 01/11/2018   PLT 194 01/11/2018    Lab Results  Component Value Date   CREATININE 1.22 01/11/2018    No results found for: PSA  Lab Results  Component Value Date   TESTOSTERONE 610.1 01/05/2018    No results found for: HGBA1C  Urinalysis    Component Value Date/Time   COLORURINE YELLOW 02/01/2016 0800   APPEARANCEUR Clear 12/07/2017 1116   LABSPEC 1.011 02/01/2016 0800   LABSPEC 1.005 08/22/2015 1608   PHURINE 7.0 02/01/2016 0800   GLUCOSEU Negative 12/07/2017 1116   GLUCOSEU Negative 08/22/2015 1608   HGBUR NEGATIVE 02/01/2016 0800   BILIRUBINUR Negative 12/07/2017 1116   BILIRUBINUR Negative 08/22/2015 1608   KETONESUR NEGATIVE 02/01/2016 0800   PROTEINUR Negative 12/07/2017 1116   PROTEINUR NEGATIVE 02/01/2016 0800   UROBILINOGEN 0.2 08/22/2015 1608   NITRITE Negative 12/07/2017 1116   NITRITE NEGATIVE 02/01/2016 0800   LEUKOCYTESUR Negative 12/07/2017 1116   LEUKOCYTESUR Trace 08/22/2015 1608    Lab  Results  Component Value Date   BACTERIA NONE SEEN 10/30/2015    Pertinent Imaging: *** No results found for this or any previous visit. No results found for this or any previous visit. No results found for this or any previous visit. No results found for this or any previous visit. Results for orders placed during the hospital encounter of 12/21/17  US RENAL   Narrative CLINICAL DATA:  History of congenital UPJ obstruction and chronic left flank pain, some difficulty urinating  EXAM: RENAL / URINARY TRACT ULTRASOUND COMPLETE  COMPARISON:  Ultrasound the abdomen of 11/27/2016 and CT abdomen pelvis of 10/22/2015  FINDINGS: Right Kidney:  Length: 11.4 cm compared to prior measurement of 10.7 cm on 11/27/2016. There is mild cortical thinning. No hydronephrosis is seen in the echogenicity of the renal parenchyma is within normal limits.  Left Kidney:  Length: 12.4 cm compared to prior measurements of  11.0 cm. There does appear to be mild cortical thinning and there is mild to moderate hydronephrosis present which persists after voiding. This appears slightly greater degree than noted on the ultrasound of 11/27/2016. The renal parenchyma is unremarkable.  Bladder:  The urinary bladder is not well distended. Ureteral jets were not visualized.  IMPRESSION: 1. Slightly more fullness of the left pelvocaliceal system than was noted on the ultrasound of 11/27/2016 possibly due to the history of chronic UPJ narrowing. 2. No renal calculi are seen. There is mild cortical thinning bilaterally.   Electronically Signed   By: Ivar Drape M.D.   On: 12/22/2017 08:15    No results found for this or any previous visit. Results for orders placed during the hospital encounter of 01/13/18  CT HEMATURIA WORKUP   Narrative CLINICAL DATA:  Left flank pain, urinary frequency and incomplete emptying of bladder.  EXAM: CT ABDOMEN AND PELVIS WITHOUT AND WITH CONTRAST  TECHNIQUE: Multidetector CT imaging of the abdomen and pelvis was performed following the standard protocol before and following the bolus administration of intravenous contrast.  CONTRAST:  132m ISOVUE-300 IOPAMIDOL (ISOVUE-300) INJECTION 61%  COMPARISON:  CT scan 08/22/2015  FINDINGS: Lower chest: The lung bases are clear of acute process. No pleural effusion or pulmonary lesions. The heart is normal in size. No pericardial effusion. The distal esophagus and aorta are unremarkable.  Hepatobiliary: No focal hepatic lesions or intrahepatic biliary dilatation. The gallbladder is surgically absent. Mild associated common bile duct dilatation.  Pancreas: No mass, inflammation or ductal dilatation. Moderate pancreatic atrophy.  Spleen: Normal size.  No focal lesions.  Adrenals/Urinary Tract: The adrenal glands are unremarkable.  Small lower pole left renal calculi are noted. No obstructing ureteral calculi or bladder  calculi.  Both kidneys demonstrate normal enhancement/perfusion. No worrisome renal lesions.  Chronic left UPJ obstruction with mild to moderate hydronephrosis.  The delayed images do not demonstrate any significant collecting system abnormalities. Both ureters are normal. The bladder is unremarkable. No bladder mass or asymmetric bladder wall thickening.  Stomach/Bowel: The stomach, duodenum, small bowel and colon are grossly normal without oral contrast. No inflammatory changes, mass lesions or obstructive findings. The terminal ileum and appendix are normal. Moderate stool throughout the colon and down into the rectum may suggest constipation.  Vascular/Lymphatic: The aorta is normal in caliber. No dissection. The branch vessels are patent. The major venous structures are patent. No mesenteric or retroperitoneal mass or adenopathy. Small scattered lymph nodes are noted.  Reproductive: Surgically absent.  Other: No pelvic mass or adenopathy. No free pelvic fluid collections. No  inguinal mass or adenopathy. No abdominal wall hernia or subcutaneous lesions.  Musculoskeletal: No significant bony findings. Moderate to advanced degenerative disc disease noted at L5-S1.  IMPRESSION: 1. Small lower pole left renal calculi but no obstructing ureteral calculi or bladder calculi. 2. 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 3. No worrisome renal or bladder lesions. 4. No acute abdominal/pelvic findings, mass lesions or adenopathy. 5. Status post cholecystectomy. Mild stable associated common bile duct dilatation.   Electronically Signed   By: Marijo Sanes M.D.   On: 01/13/2018 16:18    No results found for this or any previous visit.  Assessment & Plan:    There are no diagnoses linked to this encounter.  No follow-ups on file.  Hollice Espy, MD  [facility] [address], [place], [place] [address] [phone]

## 2018-02-08 ENCOUNTER — Other Ambulatory Visit: Payer: Self-pay | Admitting: Urology

## 2018-02-08 MED ORDER — MIRABEGRON ER 25 MG PO TB24: 25.0000 mg | ORAL_TABLET | Freq: Every day | ORAL | 3 refills | Status: DC

## 2018-02-08 NOTE — Progress Notes (Signed)
Myrbetriq sent to pharmacy.

## 2018-02-09 ENCOUNTER — Ambulatory Visit: Payer: BLUE CROSS/BLUE SHIELD | Admitting: Urology

## 2018-02-09 ENCOUNTER — Ambulatory Visit: Payer: BLUE CROSS/BLUE SHIELD | Admitting: Psychology

## 2018-02-10 ENCOUNTER — Encounter: Payer: BLUE CROSS/BLUE SHIELD | Admitting: Physical Medicine & Rehabilitation

## 2018-02-10 DIAGNOSIS — R252 Cramp and spasm: Secondary | ICD-10-CM

## 2018-02-11 ENCOUNTER — Ambulatory Visit: Payer: BLUE CROSS/BLUE SHIELD | Admitting: Psychology

## 2018-02-16 ENCOUNTER — Ambulatory Visit (INDEPENDENT_AMBULATORY_CARE_PROVIDER_SITE_OTHER): Payer: BLUE CROSS/BLUE SHIELD | Admitting: Psychology

## 2018-02-16 ENCOUNTER — Ambulatory Visit: Payer: BLUE CROSS/BLUE SHIELD | Admitting: Psychology

## 2018-02-16 DIAGNOSIS — F4312 Post-traumatic stress disorder, chronic: Secondary | ICD-10-CM

## 2018-02-17 MED ORDER — BACLOFEN 20 MG PO TABS: 20.0000 mg | ORAL_TABLET | Freq: Four times a day (QID) | ORAL | 3 refills | Status: DC

## 2018-02-18 ENCOUNTER — Ambulatory Visit: Payer: BLUE CROSS/BLUE SHIELD | Admitting: Psychology

## 2018-02-19 NOTE — Telephone Encounter (Signed)
Message from [place]

## 2018-02-22 ENCOUNTER — Ambulatory Visit: Payer: Self-pay | Admitting: Neurology

## 2018-02-25 ENCOUNTER — Ambulatory Visit: Payer: BLUE CROSS/BLUE SHIELD | Admitting: Psychology

## 2018-03-04 ENCOUNTER — Ambulatory Visit: Payer: Self-pay | Admitting: *Deleted

## 2018-03-04 ENCOUNTER — Ambulatory Visit (INDEPENDENT_AMBULATORY_CARE_PROVIDER_SITE_OTHER): Payer: Medicare Other | Admitting: Psychology

## 2018-03-04 DIAGNOSIS — G609 Hereditary and idiopathic neuropathy, unspecified: Secondary | ICD-10-CM

## 2018-03-04 DIAGNOSIS — R5382 Chronic fatigue, unspecified: Secondary | ICD-10-CM

## 2018-03-04 DIAGNOSIS — M791 Myalgia, unspecified site: Secondary | ICD-10-CM

## 2018-03-04 DIAGNOSIS — F4310 Post-traumatic stress disorder, unspecified: Secondary | ICD-10-CM | POA: Diagnosis not present

## 2018-03-04 DIAGNOSIS — R748 Abnormal levels of other serum enzymes: Secondary | ICD-10-CM

## 2018-03-04 DIAGNOSIS — D508 Other iron deficiency anemias: Secondary | ICD-10-CM

## 2018-03-04 DIAGNOSIS — Z7901 Long term (current) use of anticoagulants: Secondary | ICD-10-CM

## 2018-03-04 DIAGNOSIS — R238 Other skin changes: Secondary | ICD-10-CM

## 2018-03-04 DIAGNOSIS — R269 Unspecified abnormalities of gait and mobility: Secondary | ICD-10-CM

## 2018-03-04 DIAGNOSIS — M6281 Muscle weakness (generalized): Secondary | ICD-10-CM

## 2018-03-04 DIAGNOSIS — D45 Polycythemia vera: Secondary | ICD-10-CM

## 2018-03-04 DIAGNOSIS — R233 Spontaneous ecchymoses: Secondary | ICD-10-CM

## 2018-03-04 NOTE — Telephone Encounter (Signed)
Message from Bea Graff, NT sent at 03/04/2018 4:16 PM EST   Summary: fatigue, abnormal bruising    Pt would like to see if he can get a lab order for a CBCPD, BMP and anything Dr. Brigitte Pulse recommends. He states he had a MRI done on 12/31 and now has 2 bruises on his bottom. He states he is also more fatigue than he has been, his muscles hurt and he is having a hard time walking. He states it may be his hemoglobin. He is a NP. He is not quite sure what is going on but would like answers. Leave message if pt can't answer as he phone sometimes messes up.

## 2018-03-09 ENCOUNTER — Ambulatory Visit (INDEPENDENT_AMBULATORY_CARE_PROVIDER_SITE_OTHER): Payer: Medicare Other | Admitting: Psychology

## 2018-03-09 DIAGNOSIS — F4312 Post-traumatic stress disorder, chronic: Secondary | ICD-10-CM | POA: Diagnosis not present

## 2018-03-09 NOTE — Telephone Encounter (Signed)
Please advise. If there are any labs you would like to request pt need to get.

## 2018-03-10 ENCOUNTER — Other Ambulatory Visit: Payer: Self-pay | Admitting: Family Medicine

## 2018-03-10 NOTE — Telephone Encounter (Signed)
Requested medication (s) are due for refill today: Yes  Requested medication (s) are on the active medication list: Yes  Last refill:  01/26/18  Future visit scheduled: No  Notes to clinic:  Unable to refill per protocol     Requested Prescriptions  Pending Prescriptions Disp Refills   testosterone cypionate (DEPOTESTOSTERONE CYPIONATE) 200 MG/ML injection [Pharmacy Med Name: TESTOSTERONE CYP 200 MG/ML] 5 mL 1    Sig: Inject 0.5 mLs (100 mg total) into the muscle once a week.     Off-Protocol Failed - 03/10/2018  3:49 PM      Failed - Medication not assigned to a protocol, review manually.      Passed - Valid encounter within last 12 months    Recent Outpatient Visits          2 months ago Male-to-male transgender person   [facility], Laurey Arrow, MD   2 months ago Encounter for long-term current use of high risk medication   [facility], Ranell Patrick, MD   5 months ago Encounter for long-term current use of high risk medication   [facility], Laurey Arrow, MD      Future Appointments            In 1 month Shawnee Knapp, MD [facility] [place], [facility]   In 2 months [place], Shannon A, [place]

## 2018-03-11 ENCOUNTER — Inpatient Hospital Stay: Payer: Medicare Other | Attending: Hematology | Admitting: Hematology

## 2018-03-11 ENCOUNTER — Inpatient Hospital Stay: Payer: Medicare Other

## 2018-03-11 ENCOUNTER — Ambulatory Visit: Payer: BLUE CROSS/BLUE SHIELD | Admitting: Psychology

## 2018-03-11 ENCOUNTER — Telehealth: Payer: Self-pay | Admitting: Hematology

## 2018-03-11 ENCOUNTER — Telehealth: Payer: Self-pay

## 2018-03-11 ENCOUNTER — Encounter: Payer: Self-pay | Admitting: Hematology

## 2018-03-11 DIAGNOSIS — Z9013 Acquired absence of bilateral breasts and nipples: Secondary | ICD-10-CM | POA: Diagnosis not present

## 2018-03-11 DIAGNOSIS — R32 Unspecified urinary incontinence: Secondary | ICD-10-CM

## 2018-03-11 DIAGNOSIS — K5900 Constipation, unspecified: Secondary | ICD-10-CM | POA: Diagnosis not present

## 2018-03-11 DIAGNOSIS — D751 Secondary polycythemia: Secondary | ICD-10-CM

## 2018-03-11 DIAGNOSIS — R748 Abnormal levels of other serum enzymes: Secondary | ICD-10-CM | POA: Insufficient documentation

## 2018-03-11 DIAGNOSIS — Z8673 Personal history of transient ischemic attack (TIA), and cerebral infarction without residual deficits: Secondary | ICD-10-CM

## 2018-03-11 DIAGNOSIS — M069 Rheumatoid arthritis, unspecified: Secondary | ICD-10-CM

## 2018-03-11 DIAGNOSIS — Z86718 Personal history of other venous thrombosis and embolism: Secondary | ICD-10-CM | POA: Insufficient documentation

## 2018-03-11 DIAGNOSIS — R5383 Other fatigue: Secondary | ICD-10-CM

## 2018-03-11 DIAGNOSIS — R200 Anesthesia of skin: Secondary | ICD-10-CM

## 2018-03-11 DIAGNOSIS — D6859 Other primary thrombophilia: Secondary | ICD-10-CM | POA: Diagnosis not present

## 2018-03-11 DIAGNOSIS — R233 Spontaneous ecchymoses: Secondary | ICD-10-CM

## 2018-03-11 DIAGNOSIS — N62 Hypertrophy of breast: Secondary | ICD-10-CM

## 2018-03-11 DIAGNOSIS — Z7901 Long term (current) use of anticoagulants: Secondary | ICD-10-CM | POA: Insufficient documentation

## 2018-03-11 DIAGNOSIS — F640 Transsexualism: Secondary | ICD-10-CM | POA: Diagnosis not present

## 2018-03-11 LAB — CBC WITH DIFFERENTIAL/PLATELET
Abs Immature Granulocytes: 0.01 10*3/uL (ref 0.00–0.07)
Basophils Absolute: 0 10*3/uL (ref 0.0–0.1)
Basophils Relative: 0 %
Eosinophils Absolute: 0 10*3/uL (ref 0.0–0.5)
Eosinophils Relative: 0 %
HCT: 44.3 % (ref 39.0–52.0)
Hemoglobin: 13.3 g/dL (ref 13.0–17.0)
Immature Granulocytes: 0 %
Lymphocytes Relative: 25 %
Lymphs Abs: 1.1 10*3/uL (ref 0.7–4.0)
MCH: 23.5 pg — ABNORMAL LOW (ref 26.0–34.0)
MCHC: 30 g/dL (ref 30.0–36.0)
MCV: 78.4 fL — ABNORMAL LOW (ref 80.0–100.0)
Monocytes Absolute: 0.4 10*3/uL (ref 0.1–1.0)
Monocytes Relative: 9 %
Neutro Abs: 3.1 10*3/uL (ref 1.7–7.7)
Neutrophils Relative %: 66 %
Platelets: 202 10*3/uL (ref 150–400)
RBC: 5.65 MIL/uL (ref 4.22–5.81)
RDW: 17 % — ABNORMAL HIGH (ref 11.5–15.5)
WBC: 4.6 10*3/uL (ref 4.0–10.5)
nRBC: 0 % (ref 0.0–0.2)

## 2018-03-11 MED ORDER — RIVAROXABAN 20 MG PO TABS: ORAL_TABLET | ORAL | 1 refills | Status: DC

## 2018-03-11 NOTE — Telephone Encounter (Signed)
Printed calendar and avs. °

## 2018-03-11 NOTE — Telephone Encounter (Signed)
Spoke with patient with appointments for today 1/9, lab at 2:00, f/u with Dr. Burr Medico at 2:30 and possible phlebotomy at 3:00.  He verbalized an understanding and will come for these appointments.

## 2018-03-11 NOTE — Telephone Encounter (Signed)
  Patient is requesting a refill of the following medications: Requested Prescriptions   Pending Prescriptions Disp Refills  . testosterone cypionate (DEPOTESTOSTERONE CYPIONATE) 200 MG/ML injection [Pharmacy Med Name: TESTOSTERONE CYP 200 MG/ML] 5 mL 1    Sig: INJECT 0.5 MLS (100 MG TOTAL) INTO THE MUSCLE ONCE A WEEK.    Date of patient request: 03/10/2018 Last office visit: 01/08/2018 Date of last refill: 01/26/2018 Last refill amount: 84m Follow up time period per chart:

## 2018-03-11 NOTE — Progress Notes (Signed)
[place]   [phone]) [phone] Fax:(336) [phone]   Clinic Follow up Note   Patient Care Team: Shawnee Knapp, MD as PCP - General (Family Medicine) Bernadene Bell, MD (Inactive) as Consulting Physician (Hematology) Sheryn Bison, MD as Referring Physician (Dermatology) Elease Hashimoto, Trixie Deis, MD as Referring Physician (Gastroenterology) [name], MD as Consulting Physician (Hematology) Rozetta Nunnery, MD as Consulting Physician (Otolaryngology) Doree Fudge, PhD as Consulting Physician (Psychology) Lendon Colonel, MD as Referring Physician (Psychiatry) Meredith Staggers, MD as Consulting Physician (Physical Medicine and Rehabilitation) Hermelinda Medicus, MD as Consulting Physician (Rheumatology) [name], MD as Consulting Physician (Orthopedic Surgery) Justice Britain, MD as Consulting Physician (Orthopedic Surgery) 03/11/2018  CHIEF COMPLAINT: 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 adult who is here for follow-up. Today, he is here alone and on a wheelchair. He misplaced his Xarelto and found it just last week and restarted taking it. He is worried about his risk of blood clots because of this. He also is fatigued and noticed that he bruises easily. He experiences urinary incontinence. He can walk with a walker for about 5 minutes slowly. He takes steroids for his joints. He noticed lower limb numbness when he stands and walks, in addition to weakness. He will have an EMG soon to study his muscles. He also experienced constipation with oxycodone, and tired laxatives.  Pertinent positives and negatives of review of systems are listed and detailed within the above HPI.  REVIEW OF SYSTEMS:   Constitutional: Denies fevers, chills or abnormal weight loss (+) fatigue Eyes: Denies blurriness of vision Ears, nose, mouth, throat, and face:  Denies mucositis or sore throat Respiratory: Denies cough, dyspnea or wheezes Cardiovascular: Denies palpitation, chest discomfort or lower extremity swelling Gastrointestinal:  Denies nausea, heartburn or change in bowel habits GU: (+) urinary incontinence  Skin: Denies abnormal skin rashes (+) bruises on lower limbs Lymphatics: Denies new lymphadenopathy or easy bruising Neurological:(+) numbness in feet when he walks (+) lower limb weakness MSK: (+) multiple joint pains and surgeries  Behavioral/Psych: Mood is stable, no new changes  All other systems were reviewed with the patient and are negative.  MEDICAL HISTORY:  Past Medical History:  Diagnosis Date  . Abnormal weight loss   . Anxiety    PTSD per patient  . Arthritis   . Celiac disease   . Cervical neck pain with evidence of disc disease    patient has a cyst   . Degenerative disc disease at L5-S1 level    with stenosis  . 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 Family history of adverse reaction to anesthesia    family has problems with anesthesia of nausea and vomiting   . 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 Gluten enteropathy   . H/O parotitis    right   . History of kidney stones   . Hx-TIA (transient ischemic attack)    2015  . Neck pain   . Neuromuscular disorder ([place])    bilateral neuropathy feet.  . Pneumonia 12/17/2010  . Polycythemia   . Polycythemia, secondary   . PONV (postoperative nausea and vomiting)   . Protein C deficiency ([place])    Dr. Anne Fu  . Psoriasis   . psoriatic arthritis   . Scaphoid fracture of wrist 09/23/2013  . Sleep apnea    split night study last  done by Dr. [name] 06/18/15 shows severe OSA, CSA, and hypersomnia, rec bipap  . Stenosis of ureteropelvic junction (UPJ)    left  . Stroke [facility])    CVA vs TIA in left cerebrum causing slight right sided weakness-Dr. [name] follows  . Syrinx of spinal cord ([place]) 01/06/2014   c spine on MRI  . Tachycardia    hx of     . Transfusion history    past history- none recent    SURGICAL HISTORY: Past Surgical History:  Procedure Laterality Date  . ABDOMINAL HYSTERECTOMY Bilateral 1994   TAH, BSO- tranverse incision at 53 yo  . ANKLE ARTHROSCOPY WITH RECONSTRUCTION Right 2007  . CHOLECYSTECTOMY     laparoscopic  . HIP ARTHROSCOPY W/ LABRAL REPAIR Right 05/11/2013   acetabular labral tear 03/30/2013  . KNEE SURGERY Bilateral 1984   Right ACL, left PCL repair  . LITHOTRIPSY  2005  . LIVER BIOPSY  2013   normal results.  [name] MASTECTOMY Bilateral    prior to 2009  . NASAL SEPTUM SURGERY N/A 09/20/2015   by ENT Dr. [name]  . OVARIAN CYST SURGERY Left    size of grapefruit, was informed that she had shortened vagina  . THYROIDECTOMY, PARTIAL Left 2008  . TOTAL KNEE REVISION Left 02/06/2016   Procedure: LEFT TOTAL KNEE REVISION;  Surgeon: [name], MD;  Location: [facility];  Service: Orthopedics;  Laterality: Left;  . TOTAL KNEE REVISION Left 04/22/2017   Procedure: Left knee polyethylene revision;  Surgeon: [name], MD;  Location: [facility];  Service: Orthopedics;  Laterality: Left;  . UPPER GI ENDOSCOPY  2003    I have reviewed the social history and family history with the patient and they are unchanged from previous note.  ALLERGIES:  is allergic to penicillin g; sulfa antibiotics; vancomycin; wheat extract; duloxetine; gabapentin; tegaderm ag mesh [silver]; citalopram; acetaminophen; ibuprofen; nortriptyline; pregabalin; and sulfacetamide sodium-sulfur.  MEDICATIONS:  Current Outpatient Medications  Medication Sig Dispense Refill  . acyclovir ointment (ZOVIRAX) 5 % APPLY TO AFFECTED AREA EVERY DAY AS NEEDED  0  . baclofen (LIORESAL) 20 MG tablet Take 1 tablet (20 mg total) by mouth 4 (four) times daily. 120 tablet 3  . betamethasone dipropionate (DIPROLENE) 0.05 % cream Apply topically 2 (two) times daily.    . Calcipotriene-Betameth Diprop (ENSTILAR) 0.005-0.064 % FOAM Apply 1 application  topically 2 (two) times daily.     . clorazepate (TRANXENE) 15 MG tablet Take 15 mg by mouth at bedtime.    . clorazepate (TRANXENE) 7.5 MG tablet Take 7.5 mg by mouth 2 (two) times daily.     [name] desonide (DESOWEN) 0.05 % ointment APPLY TO AFFECTED AREAS TWICE DAILY AS NEEDED FOR PSORIASIS.  2  . diclofenac sodium (VOLTAREN) 1 % GEL Apply 1 application topically 3 (three) times daily. Bilateral hands 3 Tube 4  . furosemide (LASIX) 20 MG tablet TAKE 1 TAB BY MOUTH DAILY AS  NEEDED ,THIS IN ADDITION TABLET WITH 40  MG TABLET  AS NEEDED FOR WEIGHT GAIN OF 3LBS IN 1DAY OR 5LBS/WEEK 60 tablet 9  . furosemide (LASIX) 40 MG tablet Take 1 tablet (40 mg total) by mouth daily. Take 1 extra tab daily prn for a weight gain of 3lbs in a day or 5 lbs per week. 180 tablet 0  . Methylnaltrexone Bromide (RELISTOR) 150 MG TABS Take 1-3 tablets by mouth every morning. 90 tablet 4  . mirabegron ER (MYRBETRIQ) 25 MG TB24 tablet Take 1 tablet (25 mg  total) by mouth daily. 90 tablet 3  . morphine (KADIAN) 60 MG 24 hr capsule Take 1 capsule (60 mg total) by mouth every 12 (twelve) hours. 60 capsule 0  . NEEDLE, DISP, 18 G 18G X 1-1/2" MISC 1 Units by Does not apply route once a week. 50 each 2  . NEEDLE, DISP, 23 G 23G X 3/4" MISC 1 Units by Does not apply route once a week. 100 each 1  . neomycin-polymyxin-hydrocortisone (CORTISPORIN) 3.5-10000-1 OTIC suspension PLACE 4 DROPS INTO LEFT EAR TWICE A DAY AS NEEDED FOR PAIN AND DRAINAGE  1  . NONFORMULARY OR COMPOUNDED ITEM Apply 1 application topically 3 (three) times daily. 8% ketamine, 5% amitriptyline, 5% baclofen, 5% gabapentin  60 GM (Patient taking differently: Apply 1 application topically 3 (three) times daily as needed (pain). 8% ketamine, 5% amitriptyline, 5% baclofen, 5% gabapentin  60 GM) 1 each 5  . Oxycodone HCl 10 MG TABS Take 1 tablet (10 mg total) by mouth every 12 (twelve) hours as needed ((score 7 to 10)). 60 tablet 0  . potassium chloride (K-DUR) 10 MEQ  tablet Take 1 tablet (10 mEq total) by mouth 2 (two) times daily. May take an extra tablet daily if extra lasix is taken. 65 tablet 11  . prazosin (MINIPRESS) 1 MG capsule Take 1 mg by mouth at bedtime. Takes 1 mg and 2 mg to equal 3 mg at bedtime    . prazosin (MINIPRESS) 2 MG capsule Take 2 mg by mouth at bedtime. Takes 1 mg and 2 mg to equal 3 mg at bedtime    . rivaroxaban (XARELTO) 20 MG TABS tablet TAKE 1 TABLET DAILY BEFORE BEDTIME 90 tablet 1  . Secukinumab (COSENTYX [place]) Inject into the skin as directed. Inject every 28 days    . Syringe, Disposable, 1 ML MISC 1 Units by Does not apply route once a week. 60 each 2  . testosterone enanthate (DELATESTRYL) 200 MG/ML injection Inject 0.5 mLs (100 mg total) into the muscle every 7 (seven) days. For IM use only 10 mL 0  . ursodiol (ACTIGALL) 250 MG tablet Take 250 mg by mouth 2 (two) times daily.    . ursodiol (ACTIGALL) 500 MG tablet Take 500 mg by mouth 2 (two) times daily.     [name] venlafaxine XR (EFFEXOR-XR) 150 MG 24 hr capsule Take 2 capsules (300 mg total) by mouth daily with breakfast. 60 capsule 5   No current facility-administered medications for this visit.     PHYSICAL EXAMINATION: ECOG PERFORMANCE STATUS: 3 - Symptomatic, >50% confined to bed  Vitals:   03/11/18 1428  BP: 139/86  Pulse: 95  Resp: 18  Temp: 98.3 F (36.8 C)  SpO2: 98%   Filed Weights    GENERAL:alert, no distress and comfortable (+) on wheelchair SKIN: skin color, texture, turgor are normal, no rashes or significant lesions EYES: normal, Conjunctiva are pink and non-injected, sclera clear OROPHARYNX:no exudate, no erythema and lips, buccal mucosa, and tongue normal  NECK: supple, thyroid normal size, non-tender, without nodularity LYMPH:  no palpable lymphadenopathy in the cervical, axillary or inguinal LUNGS: clear to auscultation and percussion with normal breathing effort HEART: regular rate & rhythm and no murmurs and no lower extremity  edema ABDOMEN:abdomen soft, non-tender and normal bowel sounds (+) mild LUQ tenderness  Musculoskeletal:no cyanosis of digits and no clubbing (+) left knee replacement surgery  NEURO: alert & oriented x 3 with fluent speech, no focal motor/sensory deficits  LABORATORY DATA:  I have  reviewed the data as listed CBC Latest Ref Rng & Units 03/11/2018 01/11/2018 12/07/2017  WBC 4.0 - 10.5 K/uL 4.6 3.4(L) 4.0  Hemoglobin 13.0 - 17.0 g/dL 13.3 13.0 13.5  Hematocrit 39.0 - 52.0 % 44.3 43.2 42.2  Platelets 150 - 400 K/uL 202 194 207     CMP Latest Ref Rng & Units 01/11/2018 12/07/2017 09/09/2017  Glucose 70 - 99 mg/dL 134(H) 103(H) 112(H)  BUN 6 - 20 mg/dL 13 16 16   Creatinine 0.61 - 1.24 mg/dL 1.22 1.13 1.12  Sodium 135 - 145 mmol/L 141 138 142  Potassium 3.5 - 5.1 mmol/L 4.0 3.7 4.4  Chloride 98 - 111 mmol/L 104 97 101  CO2 22 - 32 mmol/L 29 25 27   Calcium 8.9 - 10.3 mg/dL 9.3 9.2 8.8  Total Protein 6.5 - 8.1 g/dL 6.7 - 6.4  Total Bilirubin 0.3 - 1.2 mg/dL 0.7 - 0.2  Alkaline Phos 38 - 126 U/L 183(H) - 264(H)  AST 15 - 41 U/L 24 - 24  ALT 0 - 44 U/L 27 - 27      RADIOGRAPHIC STUDIES: I have personally reviewed the radiological images as listed and agreed with the findings in the report. No results found.   ASSESSMENT & PLAN:  Jesse Bowers is a 53 y.o. adult with history of  1. Secondary Polythythemia JAK2 (-) -He's been having high H/H since his 41s. He receives testosterone injections weekly. He currently undergoes phlebotomy as needed. -Labs reviewed, CBC showed Hg 13.3 Hct 44.3, no need for phlebotomy -He feels weakness and numbness in his lower limbs. He will f/u with neurosurgery and get an EMG done soon. -We will continue lab every 2 months, phlebotomy if HCT above 45% -f/u in 5 months   2. History of DVT and stroke, Protein C deficiency -Currently on Xeloda and will continue indefinitely.  3. History of CVA  4. 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 Brigitte Pulse -He know to keep his testosterone level low, due to previous history of DVT and stroke  5. RA -Currently on Secukinumab. Continue  6. Elevated alkaline phosphatase and transaminitis -f/u with PCP and GI  7. Back Pain, knee pain and muscular spasms -Currently on Morphine 60 mg every 12 hrs, Oxycodone 10 mg as needed, and baclofen. -f/u with Dr. [name]  8. Mild splenomegaly -f/u with GI  9.  Gynecomastia -s/p bilateral mastectomies  Plan  -I refilled Xarelto  -Lab and phlebotomy if hematocrit more than 45% in 1, 3 and 5 month  -f/u in 5 months with labs -No phlebotomy today  No problem-specific Assessment & Plan notes found for this encounter.   No orders of the defined types were placed in this encounter.  All questions were answered. The patient knows to call the clinic with any problems, questions or concerns. No barriers to learning was detected. I spent 20 minutes counseling the patient face to face. The total time spent in the appointment was 25 minutes and more than 50% was on counseling and review of test results  I, Noor Dweik am acting as scribe for Dr. [name].  I have reviewed the above documentation for accuracy and completeness, and I agree with the above.     [name], MD 03/11/2018

## 2018-03-16 ENCOUNTER — Ambulatory Visit (INDEPENDENT_AMBULATORY_CARE_PROVIDER_SITE_OTHER): Payer: Medicare Other | Admitting: Psychology

## 2018-03-16 DIAGNOSIS — F4312 Post-traumatic stress disorder, chronic: Secondary | ICD-10-CM | POA: Diagnosis not present

## 2018-03-17 NOTE — Addendum Note (Signed)
Addended by: Shawnee Knapp on: 03/17/2018 04:03 PM   Modules accepted: Orders

## 2018-03-17 NOTE — Telephone Encounter (Signed)
Future lab orders entered - please let pt know.  Orders Placed This Encounter  Procedures  . Comprehensive metabolic panel    Standing Status:   Future    Standing Expiration Date:   03/18/2019  . TSH    Standing Status:   Future    Standing Expiration Date:   03/18/2019  . Vitamin B12    Standing Status:   Future    Standing Expiration Date:   03/18/2019  . Ferritin    Standing Status:   Future    Standing Expiration Date:   03/18/2019  . Sedimentation rate    Standing Status:   Future    Standing Expiration Date:   03/18/2019  . C-reactive protein    Standing Status:   Future    Standing Expiration Date:   03/18/2019  . Estradiol    Standing Status:   Future    Standing Expiration Date:   03/18/2019  . Testosterone    Standing Status:   Future    Standing Expiration Date:   03/18/2019  . CK    Standing Status:   Future    Standing Expiration Date:   03/18/2019

## 2018-03-17 NOTE — Addendum Note (Signed)
Addended by: Shawnee Knapp on: 03/17/2018 04:56 PM   Modules accepted: Orders

## 2018-03-18 ENCOUNTER — Encounter: Payer: Self-pay | Admitting: Family Medicine

## 2018-03-18 ENCOUNTER — Other Ambulatory Visit: Payer: Self-pay | Admitting: *Deleted

## 2018-03-18 ENCOUNTER — Other Ambulatory Visit: Payer: Self-pay

## 2018-03-18 MED ORDER — FUROSEMIDE 20 MG PO TABS: ORAL_TABLET | ORAL | 3 refills | Status: DC

## 2018-03-18 MED ORDER — POTASSIUM CHLORIDE ER 10 MEQ PO TBCR: 10.0000 meq | EXTENDED_RELEASE_TABLET | Freq: Two times a day (BID) | ORAL | 2 refills | Status: DC

## 2018-03-20 ENCOUNTER — Other Ambulatory Visit: Payer: Self-pay | Admitting: Cardiovascular Disease

## 2018-03-22 ENCOUNTER — Telehealth: Payer: Self-pay

## 2018-03-22 NOTE — Telephone Encounter (Signed)
Faxed Preoperative risk evaluation to [facility] regarding Xarelto, sent to HIM for scanning to chart.

## 2018-03-25 ENCOUNTER — Telehealth: Payer: Self-pay

## 2018-03-25 ENCOUNTER — Telehealth: Payer: Self-pay | Admitting: Family Medicine

## 2018-03-25 NOTE — Telephone Encounter (Signed)
Pt states he will have his med clearance done with his current PCP and wants to know if the form from [place] has Dr. Raul Del name on it? Also would like to see if the form can be faxed to Dr. Eilleen Kempf office.

## 2018-03-25 NOTE — Telephone Encounter (Signed)
Pt may be getting pre-op done at the place of surgery. He will call bk with confirmation

## 2018-03-25 NOTE — Telephone Encounter (Signed)
Called and spoke with pt regarding pre-op appt. Pt stated that "they" would be handling pre-op. When I asked pt to clarify, he stated that [facility] would be handling the pre-op. I advised that typically the PCP would do that clearance. He will call and follow up with [facility] and call us back. Pt has appt scheduled with Dr. Brigitte Pulse  On 04/19/18 so we can discuss at that appt. Surgery is not scheduled until 05/17/18. When pt calls back, please document results in chart. Thank you!

## 2018-03-25 NOTE — Telephone Encounter (Signed)
noted 

## 2018-03-26 ENCOUNTER — Other Ambulatory Visit: Payer: Self-pay | Admitting: *Deleted

## 2018-03-26 MED ORDER — POTASSIUM CHLORIDE ER 10 MEQ PO TBCR: 10.0000 meq | EXTENDED_RELEASE_TABLET | Freq: Two times a day (BID) | ORAL | 2 refills | Status: DC

## 2018-03-30 ENCOUNTER — Encounter: Payer: Medicare Other | Attending: Physical Medicine & Rehabilitation | Admitting: Physical Medicine & Rehabilitation

## 2018-03-30 ENCOUNTER — Encounter: Payer: Self-pay | Admitting: Physical Medicine & Rehabilitation

## 2018-03-30 ENCOUNTER — Ambulatory Visit: Payer: Medicare Other | Admitting: Psychology

## 2018-03-30 VITALS — BP 112/75 | HR 113 | Ht 69.0 in | Wt 228.0 lb

## 2018-03-30 DIAGNOSIS — M5416 Radiculopathy, lumbar region: Secondary | ICD-10-CM

## 2018-03-30 DIAGNOSIS — G8101 Flaccid hemiplegia affecting right dominant side: Secondary | ICD-10-CM | POA: Diagnosis not present

## 2018-03-30 DIAGNOSIS — I69398 Other sequelae of cerebral infarction: Secondary | ICD-10-CM | POA: Diagnosis not present

## 2018-03-30 DIAGNOSIS — G8929 Other chronic pain: Secondary | ICD-10-CM | POA: Insufficient documentation

## 2018-03-30 DIAGNOSIS — F4312 Post-traumatic stress disorder, chronic: Secondary | ICD-10-CM | POA: Diagnosis not present

## 2018-03-30 DIAGNOSIS — D751 Secondary polycythemia: Secondary | ICD-10-CM | POA: Insufficient documentation

## 2018-03-30 DIAGNOSIS — Z9181 History of falling: Secondary | ICD-10-CM | POA: Diagnosis not present

## 2018-03-30 DIAGNOSIS — Z79891 Long term (current) use of opiate analgesic: Secondary | ICD-10-CM | POA: Insufficient documentation

## 2018-03-30 DIAGNOSIS — Z86718 Personal history of other venous thrombosis and embolism: Secondary | ICD-10-CM | POA: Insufficient documentation

## 2018-03-30 DIAGNOSIS — K900 Celiac disease: Secondary | ICD-10-CM | POA: Diagnosis not present

## 2018-03-30 DIAGNOSIS — Z7901 Long term (current) use of anticoagulants: Secondary | ICD-10-CM | POA: Insufficient documentation

## 2018-03-30 DIAGNOSIS — F418 Other specified anxiety disorders: Secondary | ICD-10-CM | POA: Insufficient documentation

## 2018-03-30 DIAGNOSIS — D6859 Other primary thrombophilia: Secondary | ICD-10-CM | POA: Insufficient documentation

## 2018-03-30 DIAGNOSIS — R209 Unspecified disturbances of skin sensation: Secondary | ICD-10-CM | POA: Insufficient documentation

## 2018-03-30 DIAGNOSIS — K5903 Drug induced constipation: Secondary | ICD-10-CM

## 2018-03-30 DIAGNOSIS — M13862 Other specified arthritis, left knee: Secondary | ICD-10-CM | POA: Insufficient documentation

## 2018-03-30 DIAGNOSIS — Z79899 Other long term (current) drug therapy: Secondary | ICD-10-CM | POA: Insufficient documentation

## 2018-03-30 DIAGNOSIS — M545 Low back pain: Secondary | ICD-10-CM | POA: Insufficient documentation

## 2018-03-30 DIAGNOSIS — S43002A Unspecified subluxation of left shoulder joint, initial encounter: Secondary | ICD-10-CM | POA: Diagnosis not present

## 2018-03-30 DIAGNOSIS — I69320 Aphasia following cerebral infarction: Secondary | ICD-10-CM | POA: Insufficient documentation

## 2018-03-30 DIAGNOSIS — I69351 Hemiplegia and hemiparesis following cerebral infarction affecting right dominant side: Secondary | ICD-10-CM | POA: Insufficient documentation

## 2018-03-30 DIAGNOSIS — Z87890 Personal history of sex reassignment: Secondary | ICD-10-CM | POA: Insufficient documentation

## 2018-03-30 DIAGNOSIS — M7522 Bicipital tendinitis, left shoulder: Secondary | ICD-10-CM | POA: Insufficient documentation

## 2018-03-30 DIAGNOSIS — L4050 Arthropathic psoriasis, unspecified: Secondary | ICD-10-CM

## 2018-03-30 DIAGNOSIS — T402X5A Adverse effect of other opioids, initial encounter: Secondary | ICD-10-CM

## 2018-03-30 MED ORDER — MORPHINE SULFATE ER 60 MG PO CP24: 60.0000 mg | ORAL_CAPSULE | Freq: Two times a day (BID) | ORAL | 0 refills | Status: DC

## 2018-03-30 MED ORDER — OXYCODONE HCL 10 MG PO TABS: 10.0000 mg | ORAL_TABLET | Freq: Two times a day (BID) | ORAL | 0 refills | Status: DC | PRN

## 2018-03-30 MED ORDER — METHYLNALTREXONE BROMIDE 150 MG PO TABS: 1.0000 | ORAL_TABLET | Freq: Every morning | ORAL | 4 refills | Status: DC

## 2018-03-30 NOTE — Progress Notes (Signed)
Subjective:    Patient ID: Jesse Bowers, adult    DOB: February 23, 1966, 53 y.o.   MRN: [id number]  HPI   Jesse Bowers is here in follow up of his chronic pain. His bowels are moving better with relistor but he's not "cleaned out" using the 3 tabs. Even though he's not emptying completely, he feels that his pain levels have been a bit higher.  He has taken some softeners and laxatives on occasions and made some adjustments with his diet.  He reports increase sweating at times with even minimal physical activity.  He is also had sweating at night which will awaken him.  He reports no changes in medications.  No new neurological symptoms.  For pain his medications are not changed and he is on Kadian 60 mg every 12 hours with oxycodone 10 mg every 12 hours as needed.  Uses baclofen for spasms.   Pain Inventory Average Pain 7 Pain Right Now 5 My pain is constant, sharp, stabbing and aching  In the last 24 hours, has pain interfered with the following? General activity 9 Relation with others 9 Enjoyment of life 8 What TIME of day is your pain at its worst? evening Sleep (in general) Poor  Pain is worse with: walking and standing Pain improves with: rest, heat/ice, medication and injections Relief from Meds: 6  Mobility walk with assistance use a walker ability to climb steps?  no do you drive?  yes  Function disabled: date disabled . I need assistance with the following:  feeding, dressing, bathing, meal prep, household duties and shopping  Neuro/Psych bladder control problems weakness trouble walking spasms anxiety loss of taste or smell  Prior Studies Any changes since last visit?  no  Physicians involved in your care Any changes since last visit?  no   Family History  Problem Relation Age of Onset  . Stroke Maternal Grandfather        31  . Heart attack Maternal Grandfather   . Hypertension Mother   . Psoriasis Mother   . Other Mother        meningioma developed ~2019   . Cancer Paternal Grandfather   . Heart attack Paternal Grandfather   . Stroke Paternal Uncle        age 60  . Polycythemia Paternal Uncle   . Stroke Maternal Grandmother   . Congestive Heart Failure Maternal Grandmother   . Heart attack Maternal Grandmother   . Protein C deficiency Sister 82       Miscarriages   Social History   Socioeconomic History  . Marital status: Married    Spouse name: Not on file  . Number of children: 2  . Years of education: 4y college  . Highest education level: Not on file  Occupational History  . Occupation: Pediatric Nurse practitioner    Comment: Not working since [place] 2015  Social Needs  . Financial resource strain: Not on file  . Food insecurity:    Worry: Not on file    Inability: Not on file  . Transportation needs:    Medical: Not on file    Non-medical: Not on file  Tobacco Use  . Smoking status: Never Smoker  . Smokeless tobacco: Never Used  Substance and Sexual Activity  . Alcohol use: No    Frequency: Never  . Drug use: No  . Sexual activity: Yes    Birth control/protection: None    Comment: patient is a transgender on testosterone shots, no biological kids  Lifestyle  . Physical activity:    Days per week: Not on file    Minutes per session: Not on file  . Stress: Not on file  Relationships  . Social connections:    Talks on phone: Not on file    Gets together: Not on file    Attends religious service: Not on file    Active member of club or organization: Not on file    Attends meetings of clubs or organizations: Not on file    Relationship status: Not on file  Other Topics Concern  . Not on file  Social History Narrative   Education 4 year college, former Therapist, sports X 15 years, pediatric nurse practitioner x 6 years, did NP degree from [employer] [place]. Relocated to [place] about 2 months ago from [place], [place]. Patient was in [place] for last 4 years and prior to that in [place]. His wife is working as Scientist, research (physical sciences) for  [employer]. Patient is not working and applying for disability. They have 2 kids but no biologic children.    Past Surgical History:  Procedure Laterality Date  . ABDOMINAL HYSTERECTOMY Bilateral 1994   TAH, BSO- tranverse incision at 53 yo  . ANKLE ARTHROSCOPY WITH RECONSTRUCTION Right 2007  . CHOLECYSTECTOMY     laparoscopic  . HIP ARTHROSCOPY W/ LABRAL REPAIR Right 05/11/2013   acetabular labral tear 03/30/2013  . KNEE SURGERY Bilateral 1984   Right ACL, left PCL repair  . LITHOTRIPSY  2005  . LIVER BIOPSY  2013   normal results.  [name] MASTECTOMY Bilateral    prior to 2009  . NASAL SEPTUM SURGERY N/A 09/20/2015   by ENT Dr. [name]  . OVARIAN CYST SURGERY Left    size of grapefruit, was informed that she had shortened vagina  . THYROIDECTOMY, PARTIAL Left 2008  . TOTAL KNEE REVISION Left 02/06/2016   Procedure: LEFT TOTAL KNEE REVISION;  Surgeon: [name], MD;  Location: [facility];  Service: Orthopedics;  Laterality: Left;  . TOTAL KNEE REVISION Left 04/22/2017   Procedure: Left knee polyethylene revision;  Surgeon: [name], MD;  Location: [facility];  Service: Orthopedics;  Laterality: Left;  . UPPER GI ENDOSCOPY  2003   Past Medical History:  Diagnosis Date  . Abnormal weight loss   . Anxiety    PTSD per patient  . Arthritis   . Celiac disease   . Cervical neck pain with evidence of disc disease    patient has a cyst   . Degenerative disc disease at L5-S1 level    with stenosis  . 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 Family history of adverse reaction to anesthesia    family has problems with anesthesia of nausea and vomiting   . 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 Gluten enteropathy   . H/O parotitis    right   . History of kidney stones   . Hx-TIA (transient ischemic attack)    2015  . Neck pain   . Neuromuscular disorder ([place])    bilateral neuropathy feet.  . Pneumonia 12/17/2010  . Polycythemia   . Polycythemia, secondary   . PONV  (postoperative nausea and vomiting)   . Protein C deficiency ([place])    Dr. Anne Fu  . Psoriasis   . psoriatic arthritis   . Scaphoid fracture of wrist 09/23/2013  . Sleep apnea    split night study last done by Dr. [name] 06/18/15 shows severe OSA, CSA, and hypersomnia, rec bipap  .  Stenosis of ureteropelvic junction (UPJ)    left  . Stroke [facility])    CVA vs TIA in left cerebrum causing slight right sided weakness-Dr. [name] follows  . 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 Tachycardia    hx of   . Transfusion history    past history- none recent   BP 112/75   Pulse (!) 113   Ht 5' 9"  (1.753 m)   Wt 228 lb (103.4 kg)   SpO2 96%   BMI 33.67 kg/m   Opioid Risk Score:   Fall Risk Score:  `1  Depression screen PHQ 2/9  Depression screen [facility] 2/9 02/02/2018 09/23/2017 08/07/2017 06/24/2017 02/18/2017  Decreased Interest 0 0 3 0 1  Down, Depressed, Hopeless 0 0 3 0 1  PHQ - 2 Score 0 0 6 0 2  Altered sleeping - - - 0 -  Tired, decreased energy - - - 0 -  Change in appetite - - - 0 -  Feeling bad or failure about yourself  - - - 0 -  Trouble concentrating - - - 0 -  Moving slowly or fidgety/restless - - - 0 -  Suicidal thoughts - - - 0 -  PHQ-9 Score - - - 0 -  Some recent data might be hidden     Review of Systems  Constitutional: Negative.   HENT: Negative.   Eyes: Negative.   Respiratory: Negative.   Cardiovascular: Negative.   Gastrointestinal: Negative.   Endocrine: Negative.   Genitourinary: Positive for difficulty urinating.  Musculoskeletal: Positive for arthralgias, back pain, gait problem and myalgias.  Skin: Negative.   Allergic/Immunologic: Negative.   Neurological: Positive for weakness.  Hematological: Negative.   Psychiatric/Behavioral: The patient is nervous/anxious.   All other systems reviewed and are negative.      Objective:   Physical Exam General: No acute distress HEENT: EOMI, oral membranes moist Cards: reg rate    Chest: normal effort Abdomen: Soft, NT, ND Skin: dry, intact Extremities: no edema Neuro:Pt is pleasant and alert and oriented x 3.  Strength is 3-4 out of 5 in all limbs tested.   hperactive reflexespatchy sensory loss in limbs.  Musculoskeletal:pain with shoulder IR/ER but has functional use of both limbs. Psych: pleasant and cooperative   Assessment & Plan:  1. Psoriatic arthritis with pain in multiple areas, most prominently feet, hands, elbows.Hispain is also related to his prior CVA and associated motor/sensory changepatient's complaining of increased s/spasticity. 2. 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  3. Hx of left total knee revisionagain on 04/21/17 per [facility] 4. Chronic low back pain---MRI withsevere DDD at L5-S1. Left S1 radiculopathy? S/p LEFT L5-S1 ESI 5. Protein C deficiency  6. Left shoulder subluxation, bicipital tendonitis  7. Central sleep apnea  8. Polycythemia  9. Depression with anxiety. 10. Left lateral epidondylitis 11. 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 Most recent cervical MRI (07/08/17) without significant change 12. 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 [facility]  Plan:  1.Continue effexor 351m xr daily 2.Maintainhome exercise program to tolerance-AFO per [facility] 3.Voltaren gel to hands, feet, knees, elbows---  4.Baclofen for lower extermity spasms/back pain.20 mg 4 times daily #120.Myrbetriq for bladder spasms  5. Refilled kadian [id number] #60and oxycodone [id number] -We will continue the controlled substance monitoring program, this consists of regular clinic  visits, examinations, routine drug screening, pill counts as well as use of [place] Controlled Substance Reporting System. [place].    -Medication was refilled and a second prescription was sent to the patient's pharmacy for next month.   6.  Recommend that he follow-up with his primary regarding the sweating.  He may need lab work to assess his testosterone thyroid levels.  Certainly medications may be a part of this as well although his medications are somewhat chronic.[name]  7.Has had goodresults with left L5-S1 translaminar injection per Dr. Jeneen Montgomery 3rd injection perhaps next year. Continue with PT,aquatics as he's doing 8. Continue relistor for constipaiton,  3 tablets qam    -RF today   -add miralax +/- softener as well.   49mnutes of face to face patient care time were spent during this visit.follow-up withmein about 271month time with nurse practitioner. All questions were encouraged and answered.

## 2018-03-30 NOTE — Patient Instructions (Signed)
FOLLOW UP WITH YOUR PRIMARY RE: ENDOCRINE WORK UP.

## 2018-03-31 ENCOUNTER — Ambulatory Visit: Payer: BLUE CROSS/BLUE SHIELD | Admitting: Physical Therapy

## 2018-04-01 ENCOUNTER — Ambulatory Visit: Payer: Self-pay | Admitting: Psychology

## 2018-04-05 ENCOUNTER — Encounter: Payer: Medicare Other | Admitting: Physical Medicine & Rehabilitation

## 2018-04-06 ENCOUNTER — Encounter: Payer: Self-pay | Admitting: Neurology

## 2018-04-06 ENCOUNTER — Ambulatory Visit: Payer: BLUE CROSS/BLUE SHIELD | Admitting: Neurology

## 2018-04-06 ENCOUNTER — Ambulatory Visit: Payer: Medicare Other | Admitting: Psychology

## 2018-04-06 ENCOUNTER — Ambulatory Visit (INDEPENDENT_AMBULATORY_CARE_PROVIDER_SITE_OTHER): Payer: BLUE CROSS/BLUE SHIELD | Admitting: Psychology

## 2018-04-06 VITALS — BP 98/70 | HR 109

## 2018-04-06 DIAGNOSIS — G894 Chronic pain syndrome: Secondary | ICD-10-CM | POA: Diagnosis not present

## 2018-04-06 DIAGNOSIS — Z7901 Long term (current) use of anticoagulants: Secondary | ICD-10-CM

## 2018-04-06 DIAGNOSIS — F4312 Post-traumatic stress disorder, chronic: Secondary | ICD-10-CM

## 2018-04-06 DIAGNOSIS — D6859 Other primary thrombophilia: Secondary | ICD-10-CM | POA: Diagnosis not present

## 2018-04-06 DIAGNOSIS — G4731 Primary central sleep apnea: Secondary | ICD-10-CM | POA: Diagnosis not present

## 2018-04-06 DIAGNOSIS — G950 Syringomyelia and syringobulbia: Secondary | ICD-10-CM

## 2018-04-06 NOTE — Progress Notes (Signed)
[facility]  PATIENT: Jesse Bowers DOB: 1965/03/18     HISTORICAL  CHIEF COMPLAINT:  Chief Complaint  Patient presents with  . Follow-up    RM 12, alone. Last seen 11/12/17. Pt in eletric WC today    HISTORY OF PRESENT ILLNESS:  Update 04/06/2018: He is a 53 yo transgender male with complex sleep apnea.   He is on ASV mode EPAP 7 with PS 4-15.   AHI was good at 3.1 on recent download, reduced compliance at 43% days > 4 hours.   He reports using it nightly but even with humidifier he notes a dry mouth.    Sometimes he notes that he takes the mask off in the middle of the night and then does not put it back on.   Biotene at night and lozenges but it has not helped much   He takes ice chips at night.    He has gained 20 pounds or so the last 2 years.    [employer] is his P[employer] DME company.      He reports continued difficulty with cognition and memory loss.     Dr [name] evaluated (report 01/05/15 in EPIC was reviewed).  His summary reports decreased mental processing speed, verbal fluency, mental flexibility and bimanual motor speed .  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   He continues to see floaters.     He now has hearing aids.      I personally reviewed the 12/26/2017 MRI showing a stable pattern of scattered mostly subcortical small T2/FLAIR foci, most c/w chronic microvascular ischemic changes.    No large vessel strokes or lacunar strokes seen.     He has back and knee pain and sees Dr. [name] (PMR).   He has had left TKR and he has been told he needs right TKR.    He is on Kadian 60 mg qd and oxycodone 10 mg bid.      Update 11/12/2017: He was having difficulties with visual changes and flashing lights in the visual field.  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   He reports a torn retina on the right.  Apparently, some  ischemic changes were seen.   He reports his ophtometrist told him he was having mini-strokes or TIAs [name] was the OD; [employer]).  He reports more difficulty with gait due to balance and knee orthopedic issues.  Also has numbness in the hands, right greater than left and the upper back.  He uses an Clinical research associate.  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   Previous brain MRIs have shown small round subcortical T2/FLAIR hypertense foci.  These are nonspecific and could be due to chronic microvascular ischemic changes.  There are no lacunar or large vessel strokes.  MRI images were personally reviewed.  Additionally lab work and cardiology studies were also reviewed.  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  MRI brain shows some scattered subcortical white matter foci likely due to migraine or mild chronic microvascular ischemic change.   MRI lumbar 04/16/2016 shows DDD/DJD at L5-S1 with mild foraminal narrowing and small left L3-L4 disc protrusion.   Echo 12/15/2016 did not show any valvular problem or PFO/ASD, EF% was 60-65%.  He denies h/o smoking, HTN, DM.  He has OSA diagnosed in 2015 and  has been on CPAP since 2017 and uses it every night.    He no longer has the chip.        From 10/02/2015:   Syrinx/numbness:   He reports tingling in his hands, left more than right.  Also has numbness in a acapelike distribution of the back.   Laying flat increases hand numbness.    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   He reports a lot of lot of numbness in the back and arms. Ortho has told him he has subluxation at the elbow affecting his left ulnar nerve.     He also states he has woken up with severe arm weakness some mornings.    He reports a lot of pain as well as the numbness, mostly in the back.  Memory/Psych:   He reports short term memory issues.       He notes a lot of difficulty focusing.   Dr [name] evaluated (report 01/05/15 in EPIC was reviewed).  His  summary reports decreased mental processing speed, verbal fluency, mental flexibility and bimanual motor speed     He sees Dr. Erling Cruz at [facility] for PTSD and anxiety.     Night time cyclobenzapine has helped his sleep and mood related issues.    OSA:  He has OSA with central apneas and is now on ASV mode (EPAP 7 cm, PS 4-15) and has trouble wearing the mask the entire night.  He was on BiPAP but still had OSA.   Mask does not leak too much and is better than prior mask.      I reviewed his download. It shows use most days though he did not use it 1 week during his sinus surgery. Efficacy was excellent with an AHI equals 1.8.  Olfactory Hallucinations:   These are better, none recently.    He feels better since sinus surgery  Chronic pain:   He sees Dr. Ephriam Knuckles (PMR).   He has chronic pain helped a lot by a numbing ointment and Morphine.    He also reports his right foot turns in as he walks and PMR is setting him up for an AFO.    He is on Cosyntex for psoriatic arthritis.   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    His MRI brain 2015 showed mild predominantly subcortical hyperintense foci that are non-specific.  A relationship to the Prt C deficiency is possible.     EPWORTH SLEEPINESS SCALE  On a scale of 0 - 3 what is the chance of dozing:  Sitting and Reading:   2 Watching TV:    3 Sitting inactive in a public place: 0 Passenger in car for one hour: 3 Lying down to rest in the afternoon: 3 Sitting and talking to someone: 0 Sitting quietly after lunch:  1 In a car, stopped in traffic:  0  Total (out of 24):    12   (mild to moderate EDS)   REVIEW OF SYSTEMS:  Constitutional: Has fatigue.  See above.   No fevers, chills, sweats, or change in appetite Eyes: Reports some episodes of visual blurring.  There is no eye pain.  Ear, nose and throat: No hearing loss, ear pain, nasal congestion, sore  throat Cardiovascular: No chest pain.   He notes occ. palpitations Respiratory:  No shortness of breath at rest or with exertion.  No wheezes GastrointestinaI: No nausea, vomiting, diarrhea, abdominal pain, fecal incontinence Genitourinary:  No dysuria, urinary retention or frequency.  No nocturia. Musculoskeletal:  He has psoriatic arthritis with joint pain/swelling.  On high-dose opiates Integumentary:has psoriasis Neurological: as above Psychiatric: History of depression, anxiety and PTSD.   Endocrine: He is transgender and on chronic testosterone therapy.   Reports heat intolerance.  No palpitations, diaphoresis, change in appetite, change in weigh or increased thirst Hematologic/Lymphatic:  No anemia, purpura, petechiae. Allergic/Immunologic: No itchy/runny eyes, nasal congestion, recent allergic reactions, rashes  ALLERGIES: Allergies  Allergen Reactions  . Penicillin G Anaphylaxis and Other (See Comments)    Has patient had a PCN reaction causing immediate rash, facial/tongue/throat swelling, SOB or lightheadedness with hypotension: Yes Has patient had a PCN reaction causing severe rash involving mucus membranes or skin necrosis: No Has patient had a PCN reaction that required hospitalization Yes Has patient had a PCN reaction occurring within the last 10 years: No If all of the above answers are "NO", then may proceed with Cephalosporin use.   . Sulfa Antibiotics Rash    Stevens-Johnson rash  . Vancomycin Rash and Other (See Comments)    RED MAN SYNDROME CAN HAVE IF GIVEN OVER 2HOURS  . Wheat Extract Other (See Comments)    CELIAC DISEASE CELIAC DISEASE  . Duloxetine Other (See Comments)    Restless legs  . Gabapentin Nausea Only and Other (See Comments)    Other reaction(s): nausea, mental status Drowsiness and restlessness.  . Tegaderm Ag Mesh [Silver] Dermatitis    Causes blistering wounds   . Citalopram Other (See Comments)    Dystonia  . Acetaminophen Other  (See Comments)    Elevates liver enzymes.   . Ibuprofen Other (See Comments)    Contraindicated with Xarelto.   . Nortriptyline Other (See Comments)    Dry mouth at 25 mg dose.  Tolerates 10 mg dose  . Pregabalin Other (See Comments)    Ineffective  . Sulfacetamide Sodium-Sulfur Rash    HOME MEDICATIONS: Outpatient Medications Prior to Visit  Medication Sig Dispense Refill  . acyclovir ointment (ZOVIRAX) 5 % APPLY TO AFFECTED AREA EVERY DAY AS NEEDED  0  . baclofen (LIORESAL) 20 MG tablet Take 1 tablet (20 mg total) by mouth 4 (four) times daily. 120 tablet 3  . betamethasone dipropionate (DIPROLENE) 0.05 % cream Apply topically 2 (two) times daily.    . Calcipotriene-Betameth Diprop (ENSTILAR) 0.005-0.064 % FOAM Apply 1 application topically 2 (two) times daily.     . clorazepate (TRANXENE) 15 MG tablet Take 15 mg by mouth at bedtime.    . clorazepate (TRANXENE) 7.5 MG tablet Take 7.5 mg by mouth 2 (two) times daily.     [name] desonide (DESOWEN) 0.05 % ointment APPLY TO AFFECTED AREAS TWICE DAILY AS NEEDED FOR PSORIASIS.  2  . diclofenac sodium (VOLTAREN) 1 % GEL Apply 1 application topically 3 (three) times daily. Bilateral hands (Patient taking differently: Apply 1 application topically 3 (three) times daily as needed. Bilateral hands) 3 Tube 4  . furosemide (LASIX) 20 MG tablet TAKE 1 TAB BY MOUTH DAILY AS  NEEDED ,THIS IN ADDITION TABLET WITH 40  MG TABLET  AS NEEDED FOR WEIGHT GAIN OF 3LBS IN 1DAY OR 5LBS/WEEK 60 tablet 3  . Methylnaltrexone Bromide (RELISTOR) 150 MG TABS Take 1-3 tablets by mouth every morning. 270 tablet 4  . mirabegron ER (MYRBETRIQ) 25 MG TB24 tablet Take 1 tablet (25 mg total) by mouth daily. 90 tablet 3  .  morphine (KADIAN) 60 MG 24 hr capsule Take 1 capsule (60 mg total) by mouth every 12 (twelve) hours. 60 capsule 0  . NEEDLE, DISP, 18 G 18G X 1-1/2" MISC 1 Units by Does not apply route once a week. 50 each 2  . NEEDLE, DISP, 23 G 23G X 3/4" MISC 1 Units by Does  not apply route once a week. 100 each 1  . neomycin-polymyxin-hydrocortisone (CORTISPORIN) 3.5-10000-1 OTIC suspension PLACE 4 DROPS INTO LEFT EAR TWICE A DAY AS NEEDED FOR PAIN AND DRAINAGE  1  . NONFORMULARY OR COMPOUNDED ITEM Apply 1 application topically 3 (three) times daily. 8% ketamine, 5% amitriptyline, 5% baclofen, 5% gabapentin  60 GM (Patient taking differently: Apply 1 application topically 3 (three) times daily as needed (pain). 8% ketamine, 5% amitriptyline, 5% baclofen, 5% gabapentin AS NEEDED  60 GM) 1 each 5  . Oxycodone HCl 10 MG TABS Take 1 tablet (10 mg total) by mouth every 12 (twelve) hours as needed ((score 7 to 10)). 60 tablet 0  . potassium chloride (K-DUR) 10 MEQ tablet Take 1 tablet (10 mEq total) by mouth 2 (two) times daily. 200 tablet 2  . prazosin (MINIPRESS) 2 MG capsule Take 4 mg by mouth at bedtime. 30m total    . rivaroxaban (XARELTO) 20 MG TABS tablet TAKE 1 TABLET DAILY BEFORE BEDTIME 90 tablet 1  . Secukinumab (COSENTYX Latimer) Inject into the skin as directed. Inject every 28 days    . Syringe, Disposable, 1 ML MISC 1 Units by Does not apply route once a week. 60 each 2  . testosterone cypionate (DEPOTESTOSTERONE CYPIONATE) 200 MG/ML injection INJECT 0.5 MLS (100 MG TOTAL) INTO THE MUSCLE ONCE A WEEK. 10 mL 0  . ursodiol (ACTIGALL) 250 MG tablet Take 250 mg by mouth 2 (two) times daily.    . ursodiol (ACTIGALL) 500 MG tablet Take 500 mg by mouth 2 (two) times daily.     .Marland Kitchenvenlafaxine XR (EFFEXOR-XR) 150 MG 24 hr capsule Take 2 capsules (300 mg total) by mouth daily with breakfast. 60 capsule 5  . furosemide (LASIX) 40 MG tablet TAKE 1 TABLET DAILY. TAKE 1 EXTRA TAB DAILY AS NEEDED FOR WEIGHT GAIN OF 3LBS IN A DAY/ 5LBS PER WK 60 tablet 9  . prazosin (MINIPRESS) 1 MG capsule Take 1 mg by mouth at bedtime. Takes 1 mg and 2 mg to equal 3 mg at bedtime     No facility-administered medications prior to visit.     PAST MEDICAL HISTORY: Past Medical History:   Diagnosis Date  . Abnormal weight loss   . Anxiety    PTSD per patient  . Arthritis   . Celiac disease   . Cervical neck pain with evidence of disc disease    patient has a cyst   . Degenerative disc disease at L5-S1 level    with stenosis  . Failed [place] knee [place]plasty ([place] 04/22[place]  . Family history of adverse reaction to anesthesia    family has problems with anesthesia of nausea and vomiting   . 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 Gluten enteropathy   . H/O parotitis    right   . History of kidney stones   . Hx-TIA (transient ischemic attack)    2015  . Neck pain   . [place]romuscular disorder ([place]    bilateral neuropathy feet.  . Pneumonia 12/17/2010  . Polycythemia   . Polycythemia, secondary   . PONV (postoperative nausea and vomiting)   . Protein  C deficiency ([place])    Dr. Anne Fu  . Psoriasis   . psoriatic arthritis   . Scaphoid fracture of wrist 09/23/2013  . Sleep apnea    split night study last done by Dr. [name] 06/18/15 shows severe OSA, CSA, and hypersomnia, rec bipap  . Stenosis of ureteropelvic junction (UPJ)    left  . 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 [name] follows  . Syrinx of spinal cord ([place]) 01/06/2014   c spine on MRI  . Tachycardia    hx of   . Transfusion history    past history- none recent    PAST SURGICAL HISTORY: Past Surgical History:  Procedure Laterality Date  . ABDOMINAL HYSTERECTOMY Bilateral 1994   TAH, BSO- tranverse incision at 53 yo  . ANKLE ARTHROSCOPY WITH RECONSTRUCTION Right 2007  . CHOLECYSTECTOMY     laparoscopic  . HIP ARTHROSCOPY W/ LABRAL REPAIR Right 05/11/2013   acetabular labral tear 03/30/2013  . KNEE SURGERY Bilateral 1984   Right ACL, left PCL repair  . LITHOTRIPSY  2005  . LIVER BIOPSY  2013   normal results.  [name] MASTECTOMY Bilateral    prior to 2009  . NASAL SEPTUM SURGERY N/A 09/20/2015   by ENT Dr. [name]  . OVARIAN CYST SURGERY Left     size of grapefruit, was informed that she had shortened vagina  . THYROIDECTOMY, PARTIAL Left 2008  . TOTAL KNEE REVISION Left 02/06/2016   Procedure: LEFT TOTAL KNEE REVISION;  Surgeon: [name], MD;  Location: [facility];  Service: Orthopedics;  Laterality: Left;  . TOTAL KNEE REVISION Left 04/22/2017   Procedure: Left knee polyethylene revision;  Surgeon: [name], MD;  Location: [facility];  Service: Orthopedics;  Laterality: Left;  . UPPER GI ENDOSCOPY  2003    FAMILY HISTORY: Family History  Problem Relation Age of Onset  . Stroke Maternal Grandfather        58  . Heart attack Maternal Grandfather   . Hypertension Mother   . Psoriasis Mother   . Other Mother        meningioma developed ~2019  . Cancer Paternal Grandfather   . Heart attack Paternal Grandfather   . Stroke Paternal Uncle        age 46  . Polycythemia Paternal Uncle   . Stroke Maternal Grandmother   . Congestive Heart Failure Maternal Grandmother   . Heart attack Maternal Grandmother   . Protein C deficiency Sister 54       Miscarriages    SOCIAL HISTORY:  Social History   Socioeconomic History  . Marital status: Married    Spouse name: Not on file  . Number of children: 2  . Years of education: 4y college  . Highest education level: Not on file  Occupational History  . Occupation: Pediatric Nurse practitioner    Comment: Not working since [place] 2015  Social Needs  . Financial resource strain: Not on file  . Food insecurity:    Worry: Not on file    Inability: Not on file  . Transportation needs:    Medical: Not on file    Non-medical: Not on file  Tobacco Use  . Smoking status: Never Smoker  . Smokeless tobacco: Never Used  Substance and Sexual Activity  . Alcohol use: No    Frequency: Never  . Drug use: No  . Sexual activity: Yes    Birth control/protection: None  Comment: patient is a transgender on testosterone shots, no biological kids  Lifestyle  . Physical activity:    Days  per week: Not on file    Minutes per session: Not on file  . Stress: Not on file  Relationships  . Social connections:    Talks on phone: Not on file    Gets together: Not on file    Attends religious service: Not on file    Active member of club or organization: Not on file    Attends meetings of clubs or organizations: Not on file    Relationship status: Not on file  . Intimate partner violence:    Fear of current or ex partner: Not on file    Emotionally abused: Not on file    Physically abused: Not on file    Forced sexual activity: Not on file  Other Topics Concern  . Not on file  Social History Narrative   Education 4 year college, former Therapist, sports X 15 years, pediatric nurse practitioner x 6 years, did NP degree from [employer] [place]. Relocated to [place] about 2 months ago from [place], [place]. Patient was in [place] for last 4 years and prior to that in [place]. His wife is working as Scientist, research (physical sciences) for [employer]. Patient is not working and applying for disability. They have 2 kids but no biologic children.      PHYSICAL EXAM  Vitals:   04/06/18 1254  BP: 98/70  Pulse: (!) 109  SpO2: 98%    There is no height or weight on file to calculate BMI.   General: The patient is well-developed and well-nourished and in no acute distress  HEENT: Head is normocephalic and atraumatic.      Throat:   No erythema.  No elongation of palate.   Mallampati 1.    Cardiovascular: The cardiovascular examination reveals a regular rate and rhythm, no murmurs, gallops or rubs are noted.  Skin: Extremities are without significant edema or rash.      Neurologic Exam  Mental status: He appears alert and oriented.[name]   Speech is normal.  Cranial nerves: Extraocular movements are full.  Facial strength is normal..He reports decreased right facial sensation.     Trapezius and sternocleidomastoid strength is normal. No dysarthria is noted.  The tongue is midline, and the patient has symmetric  elevation of the soft palate.   Motor:  Muscle bulk and tone are normal. Strength is  5 / 5 in all 4 extremities.   Sensory: He reports reduced sensation to touch in the right arm relative to the left.  He reports reduced touch and vib in the right leg relative to the left except for external left leg with reduced touch sensation.  .  Coordination: Cerebellar testing reveals good finger-nose-finger bilaterally.  Gait and station: Station is normal.   Gait is mildly wide.  Tandem gait is poor and needs support.  Reflexes: Deep tendon reflexes are symmetric and normal bilaterally.    DIAGNOSTIC DATA (LABS, IMAGING, TESTING) - I reviewed patient records, labs, notes, testing and imaging myself where available.  Lab Results  Component Value Date   WBC 4.6 03/11/2018   HGB 13.3 03/11/2018   HCT 44.3 03/11/2018   MCV 78.4 (L) 03/11/2018   PLT 202 03/11/2018    Lab Results  Component Value Date   VITAMINB12 384 09/23/2017   Lab Results  Component Value Date   TSH 4.980 (H) 03/17/2017        ASSESSMENT  AND PLAN  Complex sleep apnea syndrome  Chronic pain syndrome  Long term (current) use of anticoagulants  Protein C deficiency ([place])  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[place])  1.   Continue BiPAP with ASV mode for complex OSA.   We discussed that some of his medications could be contributing to the dry mouth. 3.   He will continue Xarelto for protein S deficiency..   4.   He will return to see me in 12 months or sooner if new or worsening neurologic symptoms.   Adanna Zuckerman A. [name], MD, PhD 06/08/9978, 0:12 PM Certified in Neurology, Clinical Neurophysiology, Sleep Medicine, Pain Medicine and Neuroimaging  [facility] [address] [place], [place] [address] [phone]

## 2018-04-08 ENCOUNTER — Ambulatory Visit (INDEPENDENT_AMBULATORY_CARE_PROVIDER_SITE_OTHER): Payer: Medicare Other | Admitting: Psychology

## 2018-04-08 DIAGNOSIS — F4310 Post-traumatic stress disorder, unspecified: Secondary | ICD-10-CM | POA: Diagnosis not present

## 2018-04-09 ENCOUNTER — Ambulatory Visit: Payer: Medicare Other | Admitting: Physical Therapy

## 2018-04-12 ENCOUNTER — Other Ambulatory Visit: Payer: Self-pay | Admitting: Urology

## 2018-04-12 NOTE — Telephone Encounter (Signed)
Pt requests 3 month supply of Myrbetriq, he states that it cheaper than 1 mo supply.

## 2018-04-13 ENCOUNTER — Ambulatory Visit (INDEPENDENT_AMBULATORY_CARE_PROVIDER_SITE_OTHER): Payer: Medicare Other | Admitting: Psychology

## 2018-04-13 ENCOUNTER — Inpatient Hospital Stay: Payer: Medicare Other

## 2018-04-13 ENCOUNTER — Inpatient Hospital Stay: Payer: Medicare Other | Attending: Hematology

## 2018-04-13 ENCOUNTER — Telehealth: Payer: Self-pay | Admitting: Family Medicine

## 2018-04-13 DIAGNOSIS — F4312 Post-traumatic stress disorder, chronic: Secondary | ICD-10-CM | POA: Diagnosis not present

## 2018-04-13 DIAGNOSIS — D751 Secondary polycythemia: Secondary | ICD-10-CM | POA: Diagnosis present

## 2018-04-13 LAB — CBC WITH DIFFERENTIAL/PLATELET
ABS IMMATURE GRANULOCYTES: 0.01 10*3/uL (ref 0.00–0.07)
BASOS PCT: 0 %
Basophils Absolute: 0 10*3/uL (ref 0.0–0.1)
Eosinophils Absolute: 0 10*3/uL (ref 0.0–0.5)
Eosinophils Relative: 0 %
HCT: 44.2 % (ref 39.0–52.0)
Hemoglobin: 13.2 g/dL (ref 13.0–17.0)
Immature Granulocytes: 0 %
Lymphocytes Relative: 28 %
Lymphs Abs: 1 10*3/uL (ref 0.7–4.0)
MCH: 23.7 pg — ABNORMAL LOW (ref 26.0–34.0)
MCHC: 29.9 g/dL — ABNORMAL LOW (ref 30.0–36.0)
MCV: 79.2 fL — ABNORMAL LOW (ref 80.0–100.0)
Monocytes Absolute: 0.3 10*3/uL (ref 0.1–1.0)
Monocytes Relative: 9 %
Neutro Abs: 2.3 10*3/uL (ref 1.7–7.7)
Neutrophils Relative %: 63 %
PLATELETS: 187 10*3/uL (ref 150–400)
RBC: 5.58 MIL/uL (ref 4.22–5.81)
RDW: 16 % — ABNORMAL HIGH (ref 11.5–15.5)
WBC: 3.7 10*3/uL — ABNORMAL LOW (ref 4.0–10.5)
nRBC: 0 % (ref 0.0–0.2)

## 2018-04-13 MED ORDER — MIRABEGRON ER 25 MG PO TB24: 25.0000 mg | ORAL_TABLET | Freq: Every day | ORAL | 3 refills | Status: DC

## 2018-04-13 NOTE — Progress Notes (Unsigned)
Pt's HCT today 44.2 and therefore does not meet criteria for phlebotomy (goal is to keep HCT <45). Copy of labs provided to pt and plan explained. Pt denied any new symptoms/concerns. Verbalized understanding and agreement of plan. Knows to call clinic should any new issues arise.

## 2018-04-13 NOTE — Telephone Encounter (Signed)
Patient notified RX was sent to [facility] for 90 day supply.

## 2018-04-13 NOTE — Telephone Encounter (Signed)
LVM for pt regarding teir cancellation of the appt scheduled for 2/17 with Dr. Brigitte Pulse. Due to Dr. Brigitte Pulse being on leave, pt will need to be rescheduled. Pt can be schedule with Dr. Pamella Pert if pt needs to be seen before Dr. Brigitte Pulse returns or if pt can wait until Brigitte Pulse comes back, please schedule with her when she returns.

## 2018-04-14 ENCOUNTER — Encounter: Payer: Self-pay | Admitting: Physical Therapy

## 2018-04-14 ENCOUNTER — Ambulatory Visit: Payer: Medicare Other | Attending: Urology | Admitting: Physical Therapy

## 2018-04-14 DIAGNOSIS — R29898 Other symptoms and signs involving the musculoskeletal system: Secondary | ICD-10-CM | POA: Insufficient documentation

## 2018-04-14 DIAGNOSIS — G8929 Other chronic pain: Secondary | ICD-10-CM | POA: Insufficient documentation

## 2018-04-14 DIAGNOSIS — M25572 Pain in left ankle and joints of left foot: Secondary | ICD-10-CM | POA: Insufficient documentation

## 2018-04-14 DIAGNOSIS — M545 Low back pain: Secondary | ICD-10-CM | POA: Insufficient documentation

## 2018-04-14 DIAGNOSIS — M25561 Pain in right knee: Secondary | ICD-10-CM | POA: Insufficient documentation

## 2018-04-14 DIAGNOSIS — M25562 Pain in left knee: Secondary | ICD-10-CM | POA: Insufficient documentation

## 2018-04-14 DIAGNOSIS — R2689 Other abnormalities of gait and mobility: Secondary | ICD-10-CM | POA: Insufficient documentation

## 2018-04-14 DIAGNOSIS — M25571 Pain in right ankle and joints of right foot: Secondary | ICD-10-CM | POA: Insufficient documentation

## 2018-04-14 DIAGNOSIS — M62838 Other muscle spasm: Secondary | ICD-10-CM | POA: Insufficient documentation

## 2018-04-15 ENCOUNTER — Ambulatory Visit (INDEPENDENT_AMBULATORY_CARE_PROVIDER_SITE_OTHER): Payer: BLUE CROSS/BLUE SHIELD | Admitting: Psychology

## 2018-04-15 DIAGNOSIS — F4310 Post-traumatic stress disorder, unspecified: Secondary | ICD-10-CM

## 2018-04-15 NOTE — Telephone Encounter (Signed)
I talked with pt. Pt states that he got everything that is need for the surgery. Pt states that he might postpone the surgery

## 2018-04-16 ENCOUNTER — Ambulatory Visit: Payer: BLUE CROSS/BLUE SHIELD | Admitting: Family Medicine

## 2018-04-19 ENCOUNTER — Ambulatory Visit: Payer: BLUE CROSS/BLUE SHIELD | Admitting: Family Medicine

## 2018-04-20 ENCOUNTER — Ambulatory Visit (INDEPENDENT_AMBULATORY_CARE_PROVIDER_SITE_OTHER): Payer: BLUE CROSS/BLUE SHIELD | Admitting: Psychology

## 2018-04-20 DIAGNOSIS — F4312 Post-traumatic stress disorder, chronic: Secondary | ICD-10-CM

## 2018-04-21 ENCOUNTER — Ambulatory Visit: Payer: Medicare Other | Admitting: Physical Therapy

## 2018-04-22 ENCOUNTER — Telehealth: Payer: Self-pay

## 2018-04-22 ENCOUNTER — Ambulatory Visit: Payer: BLUE CROSS/BLUE SHIELD | Admitting: Psychology

## 2018-04-22 NOTE — Telephone Encounter (Signed)
   [facility] Pre-operative Risk Assessment    Request for surgical clearance:  1. What type of surgery is being performed? Right Total Knee   2. When is this surgery scheduled? 05/17/2018   3. What type of clearance is required (medical clearance vs. Pharmacy clearance to hold med vs. Both)? Both  4. Are there any medications that need to be held prior to surgery and how long? Xarelto   5. Practice name and name of physician performing surgery? [facility] - Dr.Aluisio   6. What is your office phone number [phone] attn: [place] Chard    7.   What is your office fax number [phone]  8.   Anesthesia type (None, local, MAC, general) ? Choice   Ena Dawley 04/22/2018, 4:27 PM  _________________________________________________________________   (provider comments below)

## 2018-04-23 NOTE — Telephone Encounter (Signed)
   Primary Cardiologist: Sanda Klein, MD  Chart reviewed as part of pre-operative protocol coverage. Patient was contacted 04/23/2018 in reference to pre-operative risk assessment for pending surgery as outlined below.  Jesse Bowers was last seen on 09/14/17 by Dr. [name].  Since that day, Jesse Bowers has done well.  He denies chest pain, shortness of breath, orthopnea, and lower extremity swelling.  As long as he takes his prescribed Lasix and avoid salt his weight is stable.  As part of his orthopedic work-up, the provider noted a history of sinus tachycardia and wanted him seen by our cardiology office.  The patient also requests to be seen prior to surgery.  He has an appointment with PA Meng on 05/07/2018.  He takes Xarelto for protein C deficiency and this is prescribed by [name].  She has already provided guidance on holding Xarelto for this surgery.  I will route this to PA [facility].  Following the patient's clinic appointment, we will route clearance to the requesting provider.   Tami Lin Duke, PA 04/23/2018, 9:32 AM

## 2018-04-26 NOTE — H&P (Signed)
TOTAL KNEE ADMISSION H&P  Patient is being admitted for right total knee arthroplasty.  Subjective:  Chief Complaint:right knee pain.  HPI: Jesse Bowers, 53 y.o. adult, has a history of pain and functional disability in the right knee due to arthritis and has failed non-surgical conservative treatments for greater than 12 weeks to includecorticosteriod injections, viscosupplementation injections and activity modification.  Onset of symptoms was gradual, starting 1 years ago with gradually worsening course since that time. The patient noted prior procedures on the knee to include  arthroscopy on the right knee(s).  Patient currently rates pain in the right knee(s) at 8 out of 10 with activity. Patient has worsening of pain with activity and weight bearing, crepitus and instability.  Patient has evidence of significant lateral and patellofemoral compartment joint space narrowing by imaging studies. There is no active infection.  Patient Active Problem List   Diagnosis Date Noted  . Constipation due to opioid therapy 03/30/2018  . Retinopathy of both eyes 01/06/2018  . Abnormal urinary stream 12/03/2017  . Osteoarthritis of carpometacarpal (CMC) joint of thumb 11/30/2017  . Muscle weakness 11/17/2017  . Transient vision disturbance 11/12/2017  . Bilateral hand pain 10/30/2017  . Pain of left hip joint 10/09/2017  . Gynecomastia 07/10/2017  . Iron deficiency anemia 07/05/2017  . Spasticity 05/20/2017  . Lumbar radiculitis 04/20/2017  . Ulnar 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, left 11/28/2016  . Idiopathic peripheral neuropathy 11/28/2016  . 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 Long term (current) use of anticoagulants 08/23/2015  . Right upper quadrant abdominal pain 08/23/2015  . Memory loss 05/10/2015  . Gait abnormality 04/07/2015  . Alkaline phosphatase elevation 04/07/2015  . History of thrombosis 03/26/2015  . Medial epicondylitis 02/07/2015  . Cognitive decline 12/21/2014  .  Leukopenia 12/05/2014  . Rotator cuff syndrome of right shoulder 10/27/2014  . Status post left knee replacement 08/22/2014  . Left lateral epicondylitis 08/22/2014  . Chronic cerebral ischemia 08/18/2014  . Arthrofibrosis of knee joint 08/17/2014  . Cubital canal compression syndrome, left 08/17/2014  . 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 Chronic pain syndrome 04/10/2014  . Insomnia 04/10/2014  . Chronic non-specific white matter lesions on MRI 04/10/2014  . CFS (chronic fatigue syndrome) 04/10/2014  . 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 Biceps tendonitis on left 03/01/2014  . Polycythemia vera ([place]) 12/27/2013  . H/O TIA (transient ischemic attack) and stroke 12/27/2013  . Neck pain 12/27/2013  . OSA (obstructive sleep apnea) 12/08/2013  . 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 Depression with anxiety 08/04/2013  . Protein C deficiency ([place]) 08/01/2013  . Post traumatic stress disorder (PTSD) 08/01/2013  . Speech abnormality 07/25/2013  . Obesity 05/10/2013  . Lower extremity edema 05/10/2013  . GERD (gastroesophageal reflux disease) 05/10/2013  . Arthritis 05/10/2013  . Osteoarthritis of knee 03/15/2013  . Headache 10/25/2012  . Palpitations 10/18/2012  . Fatty liver determined by biopsy 06/01/2012  . Arthropathic psoriasis, unspecified ([place]) 04/29/2012  . Abnormal liver enzymes 03/29/2012  . Psoriatic arthritis ([place]) 12/29/2011  . Left Renal Hydronephrosis 12/11/2010  . 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 Celiac disease 05/27/2010  . Thyroid nodule 05/27/2010  . Male-to-male transgender person 09/20/2002   Past Medical History:  Diagnosis Date  . Abnormal weight loss   . Anxiety    PTSD per patient  . Arthritis   . Celiac disease   . Cervical neck pain with evidence of disc disease    patient has a cyst   .  Degenerative disc disease at L5-S1 level    with stenosis  . Failed total knee arthroplasty ([place]) 04/22/2017  . Family  history of adverse reaction to anesthesia    family has problems with anesthesia of nausea and vomiting   . 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 Gluten enteropathy   . H/O parotitis    right   . History of kidney stones   . Hx-TIA (transient ischemic attack)    2015  . Neck pain   . Neuromuscular disorder ([place])    bilateral neuropathy feet.  . Pneumonia 12/17/2010  . Polycythemia   . Polycythemia, secondary   . PONV (postoperative nausea and vomiting)   . Protein C deficiency ([place])    Dr. Anne Fu  . Psoriasis   . psoriatic arthritis   . Scaphoid fracture of wrist 09/23/2013  . Sleep apnea    split night study last done by Dr. [name] 06/18/15 shows severe OSA, CSA, and hypersomnia, rec bipap  . Stenosis of ureteropelvic junction (UPJ)    left  . Stroke [facility])    CVA vs TIA in left cerebrum causing slight right sided weakness-Dr. [name] follows  . Syrinx of spinal cord ([place]) 01/06/2014   c spine on MRI  . Tachycardia    hx of   . Transfusion history    past history- none recent    Past Surgical History:  Procedure Laterality Date  . ABDOMINAL HYSTERECTOMY Bilateral 1994   TAH, BSO- tranverse incision at 53 yo  . ANKLE ARTHROSCOPY WITH RECONSTRUCTION Right 2007  . CHOLECYSTECTOMY     laparoscopic  . HIP ARTHROSCOPY W/ LABRAL REPAIR Right 05/11/2013   acetabular labral tear 03/30/2013  . KNEE SURGERY Bilateral 1984   Right ACL, left PCL repair  . LITHOTRIPSY  2005  . LIVER BIOPSY  2013   normal results.  [name] MASTECTOMY Bilateral    prior to 2009  . NASAL SEPTUM SURGERY N/A 09/20/2015   by ENT Dr. [name]  . OVARIAN CYST SURGERY Left    size of grapefruit, was informed that she had shortened vagina  . THYROIDECTOMY, PARTIAL Left 2008  . TOTAL KNEE REVISION Left 02/06/2016   Procedure: LEFT TOTAL KNEE REVISION;  Surgeon: [name], MD;  Location: [facility];  Service: Orthopedics;  Laterality: Left;  . TOTAL KNEE REVISION Left 04/22/2017   Procedure: Left knee  polyethylene revision;  Surgeon: [name], MD;  Location: [facility];  Service: Orthopedics;  Laterality: Left;  . UPPER GI ENDOSCOPY  2003    No current facility-administered medications for this encounter.    Current Outpatient Medications  Medication Sig Dispense Refill Last Dose  . acyclovir ointment (ZOVIRAX) 5 % APPLY TO AFFECTED AREA EVERY DAY AS NEEDED  0 Taking  . baclofen (LIORESAL) 20 MG tablet Take 1 tablet (20 mg total) by mouth 4 (four) times daily. 120 tablet 3 Taking  . betamethasone dipropionate (DIPROLENE) 0.05 % cream Apply topically 2 (two) times daily.   Taking  . Calcipotriene-Betameth Diprop (ENSTILAR) 0.005-0.064 % FOAM Apply 1 application topically 2 (two) times daily.    Taking  . clorazepate (TRANXENE) 15 MG tablet Take 15 mg by mouth at bedtime.   Taking  . clorazepate (TRANXENE) 7.5 MG tablet Take 7.5 mg by mouth 2 (two) times daily.    Taking  . desonide (DESOWEN) 0.05 % ointment APPLY TO AFFECTED AREAS TWICE DAILY AS NEEDED FOR PSORIASIS.  2 Taking  . diclofenac sodium (VOLTAREN) 1 % GEL Apply 1 application  topically 3 (three) times daily. Bilateral hands (Patient taking differently: Apply 1 application topically 3 (three) times daily as needed. Bilateral hands) 3 Tube 4 Taking  . furosemide (LASIX) 20 MG tablet TAKE 1 TAB BY MOUTH DAILY AS  NEEDED ,THIS IN ADDITION TABLET WITH 40  MG TABLET  AS NEEDED FOR WEIGHT GAIN OF 3LBS IN 1DAY OR 5LBS/WEEK 60 tablet 3 Taking  . Methylnaltrexone Bromide (RELISTOR) 150 MG TABS Take 1-3 tablets by mouth every morning. 270 tablet 4 Taking  . mirabegron ER (MYRBETRIQ) 25 MG TB24 tablet Take 1 tablet (25 mg total) by mouth daily. 90 tablet 3   . morphine (KADIAN) 60 MG 24 hr capsule Take 1 capsule (60 mg total) by mouth every 12 (twelve) hours. 60 capsule 0 Taking  . NEEDLE, DISP, 18 G 18G X 1-1/2" MISC 1 Units by Does not apply route once a week. 50 each 2 Taking  . NEEDLE, DISP, 23 G 23G X 3/4" MISC 1 Units by Does not apply  route once a week. 100 each 1 Taking  . neomycin-polymyxin-hydrocortisone (CORTISPORIN) 3.5-10000-1 OTIC suspension PLACE 4 DROPS INTO LEFT EAR TWICE A DAY AS NEEDED FOR PAIN AND DRAINAGE  1 Taking  . NONFORMULARY OR COMPOUNDED ITEM Apply 1 application topically 3 (three) times daily. 8% ketamine, 5% amitriptyline, 5% baclofen, 5% gabapentin  60 GM (Patient taking differently: Apply 1 application topically 3 (three) times daily as needed (pain). 8% ketamine, 5% amitriptyline, 5% baclofen, 5% gabapentin AS NEEDED  60 GM) 1 each 5 Taking  . Oxycodone HCl 10 MG TABS Take 1 tablet (10 mg total) by mouth every 12 (twelve) hours as needed ((score 7 to 10)). 60 tablet 0 Taking  . potassium chloride (K-DUR) 10 MEQ tablet Take 1 tablet (10 mEq total) by mouth 2 (two) times daily. 200 tablet 2 Taking  . prazosin (MINIPRESS) 2 MG capsule Take 4 mg by mouth at bedtime. 28m total   Taking  . rivaroxaban (XARELTO) 20 MG TABS tablet TAKE 1 TABLET DAILY BEFORE BEDTIME 90 tablet 1 Taking  . Secukinumab (COSENTYX [place]) Inject into the skin as directed. Inject every 28 days   Taking  . Syringe, Disposable, 1 ML MISC 1 Units by Does not apply route once a week. 60 each 2 Taking  . testosterone cypionate (DEPOTESTOSTERONE CYPIONATE) 200 MG/ML injection INJECT 0.5 MLS (100 MG TOTAL) INTO THE MUSCLE ONCE A WEEK. 10 mL 0 Taking  . ursodiol (ACTIGALL) 250 MG tablet Take 250 mg by mouth 2 (two) times daily.   Taking  . ursodiol (ACTIGALL) 500 MG tablet Take 500 mg by mouth 2 (two) times daily.    Taking  . venlafaxine XR (EFFEXOR-XR) 150 MG 24 hr capsule Take 2 capsules (300 mg total) by mouth daily with breakfast. 60 capsule 5 Taking   Allergies  Allergen Reactions  . Penicillin G Anaphylaxis and Other (See Comments)    Has patient had a PCN reaction causing immediate rash, facial/tongue/throat swelling, SOB or lightheadedness with hypotension: Yes Has patient had a PCN reaction causing severe rash involving mucus  membranes or skin necrosis: No Has patient had a PCN reaction that required hospitalization Yes Has patient had a PCN reaction occurring within the last 10 years: No If all of the above answers are "NO", then may proceed with Cephalosporin use.   . Sulfa Antibiotics Rash    Stevens-Johnson rash  . Vancomycin Rash and Other (See Comments)    RED MAN SYNDROME CAN HAVE IF GIVEN OVER  2HOURS  . Wheat Extract Other (See Comments)    CELIAC DISEASE CELIAC DISEASE  . Duloxetine Other (See Comments)    Restless legs  . Gabapentin Nausea Only and Other (See Comments)    Other reaction(s): nausea, mental status Drowsiness and restlessness.  . Tegaderm Ag Mesh [Silver] Dermatitis    Causes blistering wounds   . Citalopram Other (See Comments)    Dystonia  . Acetaminophen Other (See Comments)    Elevates liver enzymes.   . Ibuprofen Other (See Comments)    Contraindicated with Xarelto.   . Nortriptyline Other (See Comments)    Dry mouth at 25 mg dose.  Tolerates 10 mg dose  . Pregabalin Other (See Comments)    Ineffective  . Sulfacetamide Sodium-Sulfur Rash    Social History   Tobacco Use  . Smoking status: Never Smoker  . Smokeless tobacco: Never Used  Substance Use Topics  . Alcohol use: No    Frequency: Never    Family History  Problem Relation Age of Onset  . Stroke Maternal Grandfather        67  . Heart attack Maternal Grandfather   . Hypertension Mother   . Psoriasis Mother   . Other Mother        meningioma developed ~2019  . Cancer Paternal Grandfather   . Heart attack Paternal Grandfather   . Stroke Paternal Uncle        age 63  . Polycythemia Paternal Uncle   . Stroke Maternal Grandmother   . Congestive Heart Failure Maternal Grandmother   . Heart attack Maternal Grandmother   . Protein C deficiency Sister 9       Miscarriages     Review of Systems  Constitutional: Negative for chills and fever.  HENT: Negative for congestion, sore throat and  tinnitus.   Eyes: Negative for double vision, photophobia and pain.  Respiratory: Negative for cough, shortness of breath and wheezing.   Cardiovascular: Negative for chest pain, palpitations and orthopnea.  Gastrointestinal: Negative for heartburn, nausea and vomiting.  Genitourinary: Negative for dysuria, frequency and urgency.  Musculoskeletal: Positive for joint pain.  Neurological: Negative for dizziness, weakness and headaches.    Objective:  Physical Exam  Well nourished and well developed.  General: Alert and oriented x3, cooperative and pleasant, no acute distress.  Head: normocephalic, atraumatic, neck supple.  Eyes: EOMI.  Respiratory: breath sounds clear in all fields, no wheezing, rales, or rhonchi. Cardiovascular: Regular rate and rhythm, no murmurs, gallops or rubs.  Abdomen: non-tender to palpation and soft, normoactive bowel sounds. Musculoskeletal:  Right Knee Exam:  No effusion. No Swelling. Range of motion is 5-130 degrees.  Significant crepitus on range of motion of the knee.  No medial joint line tenderness Positive lateral joint line tenderness.  Stable knee.  Calves soft and nontender. Motor function intact in LE. Strength 5/5 LE bilaterally. Neuro: Distal pulses 2+. Sensation to light touch intact in LE.  Vital signs in last 24 hours: Blood pressure: 132/88 mmHg  Labs:   Estimated body mass index is 33.67 kg/m as calculated from the following:   Height as of 03/30/18: 5' 9"  (1.753 m).   Weight as of 03/30/18: 103.4 kg.   Imaging Review Plain radiographs demonstrate moderate degenerative joint disease of the right knee(s). The overall alignment isneutral. The bone quality appears to be adequate for age and reported activity level.      Assessment/Plan:  End stage arthritis, right knee   The patient history, physical  examination, clinical judgment of the provider and imaging studies are consistent with end stage degenerative joint disease  of the right knee(s) and total knee arthroplasty is deemed medically necessary. The treatment options including medical management, injection therapy arthroscopy and arthroplasty were discussed at length. The risks and benefits of total knee arthroplasty were presented and reviewed. The risks due to aseptic loosening, infection, stiffness, patella tracking problems, thromboembolic complications and other imponderables were discussed. The patient acknowledged the explanation, agreed to proceed with the plan and consent was signed. Patient is being admitted for inpatient treatment for surgery, pain control, PT, OT, prophylactic antibiotics, VTE prophylaxis, progressive ambulation and ADL's and discharge planning. The patient is planning to be discharged home.    Anticipated LOS equal to or greater than 2 midnights due to - Age 48 and older with one or more of the following:  - Obesity  - Expected need for hospital services (PT, OT, Nursing) required for safe  discharge  - Anticipated need for postoperative skilled nursing care or inpatient rehab  - Active co-morbidities: Stroke and DVT/VTE OR   - Unanticipated findings during/Post Surgery: None  - Patient is a high risk of re-admission due to: None   Therapy Plans: outpatient therapy at [facility] Disposition: Home with wife Planned DVT Prophylaxis: Xarelto 20 mg daily DME needed: None PCP: [name], MD Hematologist: [name], MD TXA: Topical Allergies: PCN (anaphylaxis), vancomycin (red man syndrome), sulfa Anesthesia Concerns: Nausea/vomiting BMI: 34.4  - Patient was instructed on what medications to stop prior to surgery. - Follow-up visit in 2 weeks with Dr. Wynelle Link - Begin physical therapy following surgery - Pre-operative lab work as pre-surgical testing - Prescriptions will be provided in hospital at time of discharge  Theresa Duty, PA-C Orthopedic Surgery [facility] [place]

## 2018-04-27 ENCOUNTER — Ambulatory Visit (INDEPENDENT_AMBULATORY_CARE_PROVIDER_SITE_OTHER): Payer: Medicare Other | Admitting: Psychology

## 2018-04-27 DIAGNOSIS — F4312 Post-traumatic stress disorder, chronic: Secondary | ICD-10-CM

## 2018-04-28 ENCOUNTER — Encounter: Payer: Self-pay | Admitting: Physical Therapy

## 2018-04-28 ENCOUNTER — Other Ambulatory Visit: Payer: Self-pay

## 2018-04-28 ENCOUNTER — Ambulatory Visit: Payer: Medicare Other | Admitting: Physical Therapy

## 2018-04-28 DIAGNOSIS — R2689 Other abnormalities of gait and mobility: Secondary | ICD-10-CM

## 2018-04-28 DIAGNOSIS — M25572 Pain in left ankle and joints of left foot: Secondary | ICD-10-CM | POA: Diagnosis present

## 2018-04-28 DIAGNOSIS — M62838 Other muscle spasm: Secondary | ICD-10-CM

## 2018-04-28 DIAGNOSIS — M25571 Pain in right ankle and joints of right foot: Secondary | ICD-10-CM | POA: Diagnosis present

## 2018-04-28 DIAGNOSIS — M545 Low back pain, unspecified: Secondary | ICD-10-CM

## 2018-04-28 DIAGNOSIS — G8929 Other chronic pain: Secondary | ICD-10-CM | POA: Diagnosis present

## 2018-04-28 DIAGNOSIS — M25562 Pain in left knee: Secondary | ICD-10-CM

## 2018-04-28 DIAGNOSIS — R29898 Other symptoms and signs involving the musculoskeletal system: Secondary | ICD-10-CM

## 2018-04-28 DIAGNOSIS — M25561 Pain in right knee: Secondary | ICD-10-CM

## 2018-04-28 NOTE — Patient Instructions (Signed)
Stretch for pelvic floor   "v heels slide away and then back toward buttocks and then rock knee to slight ,  slide heel along at 11 o clock away from buttocks   10 reps   ___   Deep core level 1 -2      ___   Avoid straining pelvic floor, abdominal muscles , spine  Use log rolling technique instead of getting out of bed with your neck or the sit-up     Log rolling into and out of bed If getting out of bed on L side, 1) Bent knees, scoot hips/ shoulder to R   Raise L arm completely overhead, rolling onto armpit  Then lower bent knees to bed to get into complete side lying position  Then drop legs off bed, and push up onto L elbow/forearm, and use R hand to push onto the bend

## 2018-04-29 ENCOUNTER — Ambulatory Visit (INDEPENDENT_AMBULATORY_CARE_PROVIDER_SITE_OTHER): Payer: BLUE CROSS/BLUE SHIELD | Admitting: Psychology

## 2018-04-29 ENCOUNTER — Ambulatory Visit: Payer: BLUE CROSS/BLUE SHIELD

## 2018-04-29 DIAGNOSIS — R748 Abnormal levels of other serum enzymes: Secondary | ICD-10-CM

## 2018-04-29 DIAGNOSIS — M6281 Muscle weakness (generalized): Secondary | ICD-10-CM

## 2018-04-29 DIAGNOSIS — R5382 Chronic fatigue, unspecified: Secondary | ICD-10-CM

## 2018-04-29 DIAGNOSIS — R269 Unspecified abnormalities of gait and mobility: Secondary | ICD-10-CM

## 2018-04-29 DIAGNOSIS — R238 Other skin changes: Secondary | ICD-10-CM

## 2018-04-29 DIAGNOSIS — F4310 Post-traumatic stress disorder, unspecified: Secondary | ICD-10-CM | POA: Diagnosis not present

## 2018-04-29 DIAGNOSIS — D508 Other iron deficiency anemias: Secondary | ICD-10-CM

## 2018-04-29 DIAGNOSIS — G609 Hereditary and idiopathic neuropathy, unspecified: Secondary | ICD-10-CM

## 2018-04-29 DIAGNOSIS — R233 Spontaneous ecchymoses: Secondary | ICD-10-CM

## 2018-04-29 DIAGNOSIS — M791 Myalgia, unspecified site: Secondary | ICD-10-CM

## 2018-04-29 NOTE — Therapy (Addendum)
[place] MAIN [facility] SERVICES [address] [place], [place], [address] Phone: [phone]   Fax:  [phone]  Physical Therapy Evaluation  Patient Details  Name: Jesse Bowers MRN: [id number] Date of Birth: 1965/09/30 Referring Provider (PT): Hollice Espy MD    Encounter Date: 04/28/2018    Past Medical History:  Diagnosis Date  . Abnormal weight loss   . Anxiety    PTSD per patient  . Arthritis   . Celiac disease   . Cervical neck pain with evidence of disc disease    patient has a cyst   . Degenerative disc disease at L5-S1 level    with stenosis  . 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 Family history of adverse reaction to anesthesia    family has problems with anesthesia of nausea and vomiting   . 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 Gluten enteropathy   . H/O parotitis    right   . History of kidney stones   . Hx-TIA (transient ischemic attack)    2015  . Neck pain   . Neuromuscular disorder ([place])    bilateral neuropathy feet.  . Pneumonia 12/17/2010  . Polycythemia   . Polycythemia, secondary   . PONV (postoperative nausea and vomiting)   . Protein C deficiency ([place])    Dr. Anne Fu  . Psoriasis   . psoriatic arthritis   . Scaphoid fracture of wrist 09/23/2013  . Sleep apnea    split night study last done by Dr. [name] 06/18/15 shows severe OSA, CSA, and hypersomnia, rec bipap  . Stenosis of ureteropelvic junction (UPJ)    left  . Stroke [facility])    CVA vs TIA in left cerebrum causing slight right sided weakness-Dr. [name] follows  . Syrinx of spinal cord ([place]) 01/06/2014   c spine on MRI  . Tachycardia    hx of   . Transfusion history    past history- none recent    Past Surgical History:  Procedure Laterality Date  . ABDOMINAL HYSTERECTOMY Bilateral 1994   TAH, BSO- tranverse incision at 53 yo  . ANKLE ARTHROSCOPY WITH RECONSTRUCTION Right 2007  . CHOLECYSTECTOMY     laparoscopic  . HIP  ARTHROSCOPY W/ LABRAL REPAIR Right 05/11/2013   acetabular labral tear 03/30/2013  . KNEE SURGERY Bilateral 1984   Right ACL, left PCL repair  . LITHOTRIPSY  2005  . LIVER BIOPSY  2013   normal results.  [name] MASTECTOMY Bilateral    prior to 2009  . NASAL SEPTUM SURGERY N/A 09/20/2015   by ENT Dr. [name]  . OVARIAN CYST SURGERY Left    size of grapefruit, was informed that she had shortened vagina  . THYROIDECTOMY, PARTIAL Left 2008  . TOTAL KNEE REVISION Left 02/06/2016   Procedure: LEFT TOTAL KNEE REVISION;  Surgeon: [name], MD;  Location: [facility] ORS;  Service: Orthopedics;  Laterality: Left;  . TOTAL KNEE REVISION Left 04/22/2017   Procedure: Left knee polyethylene revision;  Surgeon: [name], MD;  Location: [facility] ORS;  Service: Orthopedics;  Laterality: Left;  . UPPER GI ENDOSCOPY  2003    There were no vitals filed for this visit.   Subjective Assessment - 04/29/18 2011    Subjective 1) difficulty with initiating/ completely urination: good stream in the beginning and then weak stream.  30 sec to empty. 10-20 sec every time to initiate. Pt has tried to use deep breathing to initiate.     Standing position is easier,  he uses hands on walls.  This has been a issue for 6-7 month.      2) Urge incontinence. Pt is not able to sense he neede to go to the bathroom.    Recent urinary improvements with Mybetriq: Frequency of urination: Since taking Mybetriq, pt has been going 2-3 x per 2 hours. Prior Mybtetriq medication, pt was going 5-6 x/ every 2 hours.  Nocturia is improved from 3-4x night to 0-1 x night.    3) B knees/ back/ feet  hurt when he walks. Pt currently uses a electric WC.  Arthritis in feet and forefoot with pain when walking on barefeet.   L knee pain occurs every night when he lies on his L side.  Pt does not use a pillow between knees.  Perinent Hx to urinary issues: multiple surgeries with catherizations . Pt has scar adhesions. Pt performs kegels for many years but he  has stopped.         Pertinent History  Surgeries: Pt has had a hysterectomy and 2nd ovary removed and was catheterized.  R hip surgery with catheterization. Gall removal  through belly button with catherization. Double mastectomy for Gender Transition Therapy. L  Knee surgeries ( 7 procedures) all with catherization as a child.   R ankle reconstruction with 7 surgeries with catherization.  L sided TIA in 2015 ( R side weakness)     Patient Stated Goals  know when I have to go before the point of leakage, come off medication Mybetriq         [facility] PT Assessment - 05/02/18 1453      Assessment   Medical Diagnosis  frequency of urination    Referring Provider (PT)  Hollice Espy MD       Precautions   Precaution Comments  falls precautions      Restrictions   Weight Bearing Restrictions  No      Balance Screen   Has the patient fallen in the past 6 months  Yes      [place] residence      Observation/Other Assessments   Observations  L genu valgus > R       AROM   Overall AROM Comments  standing: R rotation/sidebend with pain localized at PSIS. Supine:  L knee ext: -30 deg flexion, R 0 deg R ,         Strength   Overall Strength Comments  L hip flex 3/5, R 3+/5.  B knee ext/flex 4+/5       Palpation   Palpation comment  increased scar restrictions over masectomy B ( pt reports increased swelling L axilla recently . Significant fascial restrictions on L suprapubic area > R        Bed Mobility   Bed Mobility  --   crunch method               Objective measurements completed on examination: See above findings.    Pelvic Floor Special Questions - 05/02/18 1458    External Perineal Exam  increased tightness at L ischiocavenosus/ bulbospongious > R        [facility] Adult PT Treatment/Exercise - 05/02/18 1453      Neuro Re-ed    Neuro Re-ed Details   see pt isntructions cued for pelvic floor lengthening       Manual Therapy    Manual therapy comments  external STM release at anterior triangle of pelvic floor L  PT Long Term Goals - 05/01/18 2031      PT LONG TERM GOAL #1   Title  Pt will report decrease time to initiate urination ( either sitting / standing postions)  from 10-15 sec to < 5 sec across 75% of the time in order to improve QOL    Time  8    Period  Weeks    Status  New      PT LONG TERM GOAL #2   Title  Pt will demo proper lenghtening of pelvic floor and deep core coordination to  complete urination across 75% of the time  in order to participate in community events     Time  10    Period  Weeks    Status  New      PT LONG TERM GOAL #3   Title  Pt will be able to make it to the bathroom in time before leakage from 4 days a week to < 2 days week in order to perform ADLs     Time  10    Period  Weeks    Status  New      PT LONG TERM GOAL #4   Title  Pt will decrease abdominal scar restrictions and fascial tightness in L suprapubic area in order to demo improved urination elimination     Time  5    Period  Weeks    Status  New    Target Date  06/05/18      PT LONG TERM GOAL #5   Title  Pt will demo IND with hip mobility and knee strengthening HEP in order to minimize overactivity of pelvic floor     Time  10    Period  Weeks    Status  New    Target Date  07/10/18      Additional Long Term Goals   Additional Long Term Goals  Yes      PT LONG TERM GOAL #6   Title  Pt will increase his PSFS score: "urinate and feel done/no pressure, no pain, no residual urine everytime " 1-2 pts to > 5-6 pts,  "be able to go out for dinner, lunch, movie for 2+ hrs and not have to pee again after going once before event"  0 pt > 4 pts,  "Take less than 20 min to go to bathroom "   3 pts > 6 pts     Time  10    Period  Weeks    Status  New      PT LONG TERM GOAL #7   Title  Pt will decrease his ZUNG anxiety scale from 66% to < 33% in order to demo improved relaxation  strategies for downregulating nervous ssystem for bladder control , less frequency, and pain managements    Time  10    Period  Weeks    Status  New      PT LONG TERM GOAL #8   Title  Pt will decrease his NIH-CPSI score from 81% to < 50% in order to improve pelvic floor funciton     Time  10    Period  Weeks    Status  New            Plan - 04/29/18 2014    Clinical Impression Statement  Pt is a 54 yo transgender male who reports difficulty with initiating/ completely urination, urge incontinence and additional orthopedic issues with B back, knee, ankle, feet  pain.  Pt has a complex medical Hx which contribute to his Sx ( see below). Clinical presentations that impact his Sx include: dependence on motorized WC for long distances,weakness and genu valgus in stance, L knee ext limitation, scar and fascial restrictions over low abdominal area L > R , scar restrictions under breasts, swelling around L axilla by mastectomy scar, L pelvic floor tightness/ tenderness > R,  limited spinal mobility. Plan to refer pt to lymphedema specialist re: L axilla swelling. Following Tx today, pt demo'd decreased L pelvic floor tightness and was advanced to deep core strengthening and pelvic floor lengthening.   Surgeries: Pt has had a hysterectomy and 2nd ovary removed and was catheterized.  R hip surgery with catheterization. Gall removal  through belly button with catherization. Double mastectomy for Gender Transition Therapy. L  Knee surgeries ( 7 procedures) all with catherization as a child.   R ankle reconstruction with 7 surgeries with catherization.  L sided TIA in 2015 ( R side weakness)       Clinical Presentation  Unstable    Clinical Decision Making  Moderate    Rehab Potential  Good    PT Frequency  1x / week    PT Duration  Other (comment)   10   PT Treatment/Interventions  ADLs/Self Care Home Management;Neuromuscular re-education;Therapeutic activities;Therapeutic exercise;Moist  Heat;Patient/family education;Manual techniques;Scar mobilization;Passive range of motion;Manual lymph drainage;Electrical Stimulation;Aquatic Therapy;Energy conservation;Gait training;Stair training;Functional mobility training    Consulted and Agree with Plan of Care  Patient       Patient will benefit from skilled therapeutic intervention in order to improve the following deficits and impairments:  Decreased range of motion, Decreased endurance, Decreased activity tolerance, Abnormal gait, Decreased safety awareness, Decreased scar mobility, Difficulty walking, Increased muscle spasms, Improper body mechanics, Decreased coordination, Decreased balance, Decreased strength, Postural dysfunction, Increased fascial restricitons, Pain  Visit Diagnosis: Other muscle spasm  Other symptoms and signs involving the musculoskeletal system  Other abnormalities of gait and mobility  Chronic pain of left knee  Chronic pain of right knee  Pain in left ankle and joints of left foot  Pain in right ankle and joints of right foot  Chronic bilateral low back pain without sciatica     Problem List Patient Active Problem List   Diagnosis Date Noted  . Constipation due to opioid therapy 03/30/2018  . Retinopathy of both eyes 01/06/2018  . Abnormal urinary stream 12/03/2017  . Osteoarthritis of carpometacarpal (CMC) joint of thumb 11/30/2017  . Muscle weakness 11/17/2017  . Transient vision disturbance 11/12/2017  . Bilateral hand pain 10/30/2017  . Pain of left hip joint 10/09/2017  . Gynecomastia 07/10/2017  . Iron deficiency anemia 07/05/2017  . Spasticity 05/20/2017  . Lumbar radiculitis 04/20/2017  . Ulnar 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, left 11/28/2016  . Idiopathic peripheral neuropathy 11/28/2016  . 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 Long term (current) use of anticoagulants 08/23/2015  . Right upper quadrant abdominal pain 08/23/2015  . Memory loss 05/10/2015  . Gait  abnormality 04/07/2015  . Alkaline phosphatase elevation 04/07/2015  . History of thrombosis 03/26/2015  . Medial epicondylitis 02/07/2015  . Cognitive decline 12/21/2014  . Leukopenia 12/05/2014  . Rotator cuff syndrome of right shoulder 10/27/2014  . Status post left knee replacement 08/22/2014  . Left lateral epicondylitis 08/22/2014  . Chronic cerebral ischemia 08/18/2014  . Arthrofibrosis of knee joint 08/17/2014  . Cubital canal compression syndrome, left 08/17/2014  . 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 Chronic pain  syndrome 04/10/2014  . Insomnia 04/10/2014  . Chronic non-specific white matter lesions on MRI 04/10/2014  . CFS (chronic fatigue syndrome) 04/10/2014  . 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 Biceps tendonitis on left 03/01/2014  . Polycythemia vera ([place]) 12/27/2013  . H/O TIA (transient ischemic attack) and stroke 12/27/2013  . Neck pain 12/27/2013  . OSA (obstructive sleep apnea) 12/08/2013  . 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 Depression with anxiety 08/04/2013  . Protein C deficiency ([place]) 08/01/2013  . Post traumatic stress disorder (PTSD) 08/01/2013  . Speech abnormality 07/25/2013  . Obesity 05/10/2013  . Lower extremity edema 05/10/2013  . GERD (gastroesophageal reflux disease) 05/10/2013  . Arthritis 05/10/2013  . Osteoarthritis of knee 03/15/2013  . Headache 10/25/2012  . Palpitations 10/18/2012  . Fatty liver determined by biopsy 06/01/2012  . Arthropathic psoriasis, unspecified ([place]) 04/29/2012  . Abnormal liver enzymes 03/29/2012  . Psoriatic arthritis ([place]) 12/29/2011  . Left Renal Hydronephrosis 12/11/2010  . 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 Celiac disease 05/27/2010  . Thyroid nodule 05/27/2010  . Male-to-male transgender person 09/20/2002    Jerl Mina ,PT, DPT, E-RYT  05/02/2018, 2:58 PM  [place] MAIN [facility] SERVICES [address]  [place], [place], [address] Phone: [phone]   Fax:  [phone]  Name: Fahad Cisse MRN: [id number] Date of Birth: 19-Apr-1965

## 2018-04-30 LAB — CK: Total CK: 138 U/L (ref 24–204)

## 2018-04-30 LAB — COMPREHENSIVE METABOLIC PANEL
ALT: 24 IU/L (ref 0–44)
AST: 27 IU/L (ref 0–40)
Albumin/Globulin Ratio: 2.6 — ABNORMAL HIGH (ref 1.2–2.2)
Albumin: 4.7 g/dL (ref 3.8–4.9)
Alkaline Phosphatase: 214 IU/L — ABNORMAL HIGH (ref 39–117)
BUN/Creatinine Ratio: 14 (ref 9–20)
BUN: 15 mg/dL (ref 6–24)
Bilirubin Total: 0.4 mg/dL (ref 0.0–1.2)
CO2: 26 mmol/L (ref 20–29)
Calcium: 9.2 mg/dL (ref 8.7–10.2)
Chloride: 99 mmol/L (ref 96–106)
Creatinine, Ser: 1.11 mg/dL (ref 0.76–1.27)
GFR calc Af Amer: 88 mL/min/{1.73_m2} (ref >59)
GFR, EST NON AFRICAN AMERICAN: 76 mL/min/{1.73_m2} (ref >59)
Globulin, Total: 1.8 g/dL (ref 1.5–4.5)
Glucose: 89 mg/dL (ref 65–99)
POTASSIUM: 4.3 mmol/L (ref 3.5–5.2)
Sodium: 139 mmol/L (ref 134–144)
Total Protein: 6.5 g/dL (ref 6.0–8.5)

## 2018-04-30 LAB — SEDIMENTATION RATE: Sed Rate: 2 mm/hr (ref 0–30)

## 2018-04-30 LAB — TESTOSTERONE: Testosterone: 1129 ng/dL — ABNORMAL HIGH (ref 264–916)

## 2018-04-30 LAB — VITAMIN B12: VITAMIN B 12: 250 pg/mL (ref 232–1245)

## 2018-04-30 LAB — ESTRADIOL: Estradiol: 42.8 pg/mL — ABNORMAL HIGH (ref 7.6–42.6)

## 2018-04-30 LAB — C-REACTIVE PROTEIN: CRP: <1 mg/L (ref 0–10)

## 2018-04-30 LAB — TSH: TSH: 1.37 u[IU]/mL (ref 0.450–4.500)

## 2018-04-30 LAB — FERRITIN: Ferritin: 12 ng/mL — ABNORMAL LOW (ref 30–400)

## 2018-05-01 ENCOUNTER — Telehealth: Payer: Self-pay | Admitting: Family Medicine

## 2018-05-01 NOTE — Telephone Encounter (Signed)
mychart message sent to pt about their appointment with Dr Shaw °

## 2018-05-02 NOTE — Addendum Note (Signed)
Addended by: Jerl Mina on: 05/02/2018 03:01 PM   Modules accepted: Orders

## 2018-05-04 ENCOUNTER — Ambulatory Visit (INDEPENDENT_AMBULATORY_CARE_PROVIDER_SITE_OTHER): Payer: BLUE CROSS/BLUE SHIELD | Admitting: Psychology

## 2018-05-04 DIAGNOSIS — F4312 Post-traumatic stress disorder, chronic: Secondary | ICD-10-CM

## 2018-05-05 ENCOUNTER — Ambulatory Visit: Payer: Medicare Other | Attending: Urology | Admitting: Physical Therapy

## 2018-05-05 ENCOUNTER — Telehealth: Payer: Self-pay

## 2018-05-05 DIAGNOSIS — R2689 Other abnormalities of gait and mobility: Secondary | ICD-10-CM | POA: Diagnosis present

## 2018-05-05 DIAGNOSIS — M25572 Pain in left ankle and joints of left foot: Secondary | ICD-10-CM | POA: Diagnosis present

## 2018-05-05 DIAGNOSIS — M545 Low back pain, unspecified: Secondary | ICD-10-CM

## 2018-05-05 DIAGNOSIS — G8929 Other chronic pain: Secondary | ICD-10-CM

## 2018-05-05 DIAGNOSIS — M25571 Pain in right ankle and joints of right foot: Secondary | ICD-10-CM | POA: Insufficient documentation

## 2018-05-05 DIAGNOSIS — M62838 Other muscle spasm: Secondary | ICD-10-CM | POA: Insufficient documentation

## 2018-05-05 DIAGNOSIS — R29898 Other symptoms and signs involving the musculoskeletal system: Secondary | ICD-10-CM

## 2018-05-05 DIAGNOSIS — M25561 Pain in right knee: Secondary | ICD-10-CM | POA: Insufficient documentation

## 2018-05-05 DIAGNOSIS — M25562 Pain in left knee: Secondary | ICD-10-CM | POA: Diagnosis present

## 2018-05-05 NOTE — Patient Instructions (Addendum)
Decreasing Low back pain and lengthening pelvic floor   Pelvic tilts Forward, Back, and Neutral   STANDING ( neutral is where you want to practice finding more and more often. More weight across the ball mound of feet and heels not only the heels, and not locking the knees.   SITTING: feet under knees, hip width apart. Weigh on the sitting bones. Thumb on the back iliac crest, Index finger at the front of the hip. Rock through 3 positions to find neutral, press in the feet and sense the sitting bones ( ischial tuberosity) in contact to the seat.   Posterior tilt ( thumb is lower)  Anterior tilt ( index finger is lower).   Neutral ( thumb and index finger is levelled)   . Apply when toileting    Sitting at work chair that may have a dip in the seat Place folded towel/ blanket placed towards the back of the seat , sitting on sitting bones , don't lean to the back of the chair

## 2018-05-06 NOTE — Therapy (Signed)
[place] MAIN [facility] SERVICES [address] [place], [place], [address] Phone: [phone]   Fax:  [phone]  Physical Therapy Treatment  Patient Details  Name: Jesse Bowers MRN: [id number] Date of Birth: 1965/12/18 Referring Provider (PT): Hollice Espy MD    Encounter Date: 05/05/2018  PT End of Session - 05/06/18 1234    Visit Number  2    Number of Visits  10    Date for PT Re-Evaluation  07/08/18    PT Start Time  1006    PT Stop Time  1100    PT Time Calculation (min)  54 min    Activity Tolerance  Patient tolerated treatment well    Behavior During Therapy  [facility] for tasks assessed/performed       Past Medical History:  Diagnosis Date  . Abnormal weight loss   . Anxiety    PTSD per patient  . Arthritis   . Celiac disease   . Cervical neck pain with evidence of disc disease    patient has a cyst   . Degenerative disc disease at L5-S1 level    with stenosis  . 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 Family history of adverse reaction to anesthesia    family has problems with anesthesia of nausea and vomiting   . 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 Gluten enteropathy   . H/O parotitis    right   . History of kidney stones   . Hx-TIA (transient ischemic attack)    2015  . Neck pain   . Neuromuscular disorder ([place])    bilateral neuropathy feet.  . Pneumonia 12/17/2010  . Polycythemia   . Polycythemia, secondary   . PONV (postoperative nausea and vomiting)   . Protein C deficiency ([place])    Dr. Anne Fu  . Psoriasis   . psoriatic arthritis   . Scaphoid fracture of wrist 09/23/2013  . Sleep apnea    split night study last done by Dr. [name] 06/18/15 shows severe OSA, CSA, and hypersomnia, rec bipap  . Stenosis of ureteropelvic junction (UPJ)    left  . Stroke [facility]    CVA vs TIA in left cerebrum causing slight right sided weakness-Dr. [name] follows  . Syrinx of spinal cord ([place]) 01/06/2014   c spine  on MRI  . Tachycardia    hx of   . Transfusion history    past history- none recent    Past Surgical History:  Procedure Laterality Date  . ABDOMINAL HYSTERECTOMY Bilateral 1994   TAH, BSO- tranverse incision at 53 yo  . ANKLE ARTHROSCOPY WITH RECONSTRUCTION Right 2007  . CHOLECYSTECTOMY     laparoscopic  . HIP ARTHROSCOPY W/ LABRAL REPAIR Right 05/11/2013   acetabular labral tear 03/30/2013  . KNEE SURGERY Bilateral 1984   Right ACL, left PCL repair  . LITHOTRIPSY  2005  . LIVER BIOPSY  2013   normal results.  [name] MASTECTOMY Bilateral    prior to 2009  . NASAL SEPTUM SURGERY N/A 09/20/2015   by ENT Dr. [name]  . OVARIAN CYST SURGERY Left    size of grapefruit, was informed that she had shortened vagina  . THYROIDECTOMY, PARTIAL Left 2008  . TOTAL KNEE REVISION Left 02/06/2016   Procedure: LEFT TOTAL KNEE REVISION;  Surgeon: [name], MD;  Location: [facility];  Service: Orthopedics;  Laterality: Left;  . TOTAL KNEE REVISION Left 04/22/2017   Procedure: Left knee polyethylene revision;  Surgeon: [name], MD;  Location: [facility];  Service: Orthopedics;  Laterality: Left;  . UPPER GI ENDOSCOPY  2003    There were no vitals filed for this visit.  Subjective Assessment - 05/05/18 1014    Subjective  Pt noticed it was easier to pee when standing after last session. Pt felt sore in the L hip after last session, the R hip hurt more than L. This soreness dissipated the next day. Pt did stretches the next morning.      Pertinent History  Surgeries: Pt has had a hysterectomy and 2nd ovary removed and was catheterized.  R hip surgery with catheterization. Gall removal  through belly button with catherization. Double mastectomy for Gender Transition Therapy. L  Knee surgeries ( 7 procedures) all with catherization as a child.   R ankle reconstruction with 7 surgeries with catherization.  L sided TIA in 2015 ( R side weakness)     Patient Stated Goals  know when I have to go before the  point of leakage, come off medication Mybetriq         [facility] PT Assessment - 05/06/18 1235      Palpation   Palpation comment  decreasing suprapubic scar restriction                 Pelvic Floor Special Questions - 05/06/18 1238    External Perineal Exam   increased tightness at B ischiocavenosus/ bulbospongious/ deep transverse perineal mm         [facility] Adult PT Treatment/Exercise - 05/06/18 1238      Neuro Re-ed    Neuro Re-ed Details   cued for pelvic floor lengthening, cued for pelvic tilts in all positions and with seated urination       Manual Therapy   Manual therapy comments  external STM release mm noted in assessment                   PT Long Term Goals - 05/01/18 2031      PT LONG TERM GOAL #1   Title  Pt will report decrease time to initiate urination ( either sitting / standing postions)  from 10-15 sec to < 5 sec across 75% of the time in order to improve QOL    Time  8    Period  Weeks    Status  New      PT LONG TERM GOAL #2   Title  Pt will demo proper lenghtening of pelvic floor and deep core coordination to  complete urination across 75% of the time  in order to participate in community events     Time  10    Period  Weeks    Status  New      PT LONG TERM GOAL #3   Title  Pt will be able to make it to the bathroom in time before leakage from 4 days a week to < 2 days week in order to perform ADLs     Time  10    Period  Weeks    Status  New      PT LONG TERM GOAL #4   Title  Pt will decrease abdominal scar restrictions and fascial tightness in L suprapubic area in order to demo improved urination elimination     Time  5    Period  Weeks    Status  New    Target Date  06/05/18      PT LONG TERM GOAL #5   Title  Pt will demo IND with hip mobility and knee strengthening HEP in order to minimize overactivity of pelvic floor     Time  10    Period  Weeks    Status  New    Target Date  07/10/18      Additional Long Term  Goals   Additional Long Term Goals  Yes      PT LONG TERM GOAL #6   Title  Pt will increase his PSFS score: "urinate and feel done/no pressure, no pain, no residual urine everytime " 1-2 pts to > 5-6 pts,  "be able to go out for dinner, lunch, movie for 2+ hrs and not have to pee again after going once before event"  0 pt > 4 pts,  "Take less than 20 min to go to bathroom "   3 pts > 6 pts     Time  10    Period  Weeks    Status  New      PT LONG TERM GOAL #7   Title  Pt will decrease his ZUNG anxiety scale from 66% to < 33% in order to demo improved relaxation strategies for downregulating nervous ssystem for bladder control , less frequency, and pain managements    Time  10    Period  Weeks    Status  New      PT LONG TERM GOAL #8   Title  Pt will decrease his NIH-CPSI score from 81% to < 50% in order to improve pelvic floor funciton     Time  10    Period  Weeks    Status  New            Plan - 05/06/18 1235    Clinical Impression Statement Pt is making good progress from last session as pt reported easier urination in standing position after last Tx. Pt demo'd decreased abdominal scar restrictions and anterior pelvic floor mm tightness post external manual Tx today. Pt demo'd improved pelvic floor lengthening post Tx.  Propioception training was applied to utilize pelvic tilt to improve lengthened position of pelvic floor with seated urination which is the position he is still having difficulty.    Rehab Potential  Good    PT Frequency  1x / week    PT Duration  Other (comment)   10   PT Treatment/Interventions  ADLs/Self Care Home Management;Neuromuscular re-education;Therapeutic activities;Therapeutic exercise;Moist Heat;Patient/family education;Manual techniques;Scar mobilization;Passive range of motion;Manual lymph drainage;Electrical Stimulation;Aquatic Therapy;Energy conservation;Gait training;Stair training;Functional mobility training    Consulted and Agree with Plan of  Care  Patient       Patient will benefit from skilled therapeutic intervention in order to improve the following deficits and impairments:  Decreased range of motion, Decreased endurance, Decreased activity tolerance, Abnormal gait, Decreased safety awareness, Decreased scar mobility, Difficulty walking, Increased muscle spasms, Improper body mechanics, Decreased coordination, Decreased balance, Decreased strength, Postural dysfunction, Increased fascial restricitons, Pain  Visit Diagnosis: Other muscle spasm  Other symptoms and signs involving the musculoskeletal system  Other abnormalities of gait and mobility  Chronic pain of left knee  Chronic pain of right knee  Pain in left ankle and joints of left foot  Chronic bilateral low back pain without sciatica  Pain in right ankle and joints of right foot     Problem List Patient Active Problem List   Diagnosis Date Noted  . Constipation due to opioid therapy 03/30/2018  . Retinopathy of both eyes 01/06/2018  . Abnormal urinary  stream 12/03/2017  . Osteoarthritis of carpometacarpal (CMC) joint of thumb 11/30/2017  . Muscle weakness 11/17/2017  . Transient vision disturbance 11/12/2017  . Bilateral hand pain 10/30/2017  . Pain of left hip joint 10/09/2017  . Gynecomastia 07/10/2017  . Iron deficiency anemia 07/05/2017  . Spasticity 05/20/2017  . Lumbar radiculitis 04/20/2017  . Ulnar 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, left 11/28/2016  . Idiopathic peripheral neuropathy 11/28/2016  . 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 Long term (current) use of anticoagulants 08/23/2015  . Right upper quadrant abdominal pain 08/23/2015  . Memory loss 05/10/2015  . Gait abnormality 04/07/2015  . Alkaline phosphatase elevation 04/07/2015  . History of thrombosis 03/26/2015  . Medial epicondylitis 02/07/2015  . Cognitive decline 12/21/2014  . Leukopenia 12/05/2014  . Rotator cuff syndrome of right shoulder 10/27/2014  . Status post  left knee replacement 08/22/2014  . Left lateral epicondylitis 08/22/2014  . Chronic cerebral ischemia 08/18/2014  . Arthrofibrosis of knee joint 08/17/2014  . Cubital canal compression syndrome, left 08/17/2014  . 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[place]) 04/10/2014  . Chronic pain syndrome 04/10/2014  . Insomnia 04/10/2014  . Chronic non-specific white matter lesions on MRI 04/10/2014  . CFS (chronic fatigue syndrome) 04/10/2014  . 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 Biceps tendonitis on left 03/01/2014  . Polycythemia vera ([place]) 12/27/2013  . H/O TIA (transient ischemic attack) and stroke 12/27/2013  . Neck pain 12/27/2013  . OSA (obstructive sleep apnea) 12/08/2013  . 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 Depression with anxiety 08/04/2013  . Protein C deficiency ([place]) 08/01/2013  . Post traumatic stress disorder (PTSD) 08/01/2013  . Speech abnormality 07/25/2013  . Obesity 05/10/2013  . Lower extremity edema 05/10/2013  . GERD (gastroesophageal reflux disease) 05/10/2013  . Arthritis 05/10/2013  . Osteoarthritis of knee 03/15/2013  . Headache 10/25/2012  . Palpitations 10/18/2012  . Fatty liver determined by biopsy 06/01/2012  . Arthropathic psoriasis, unspecified ([place]) 04/29/2012  . Abnormal liver enzymes 03/29/2012  . Psoriatic arthritis ([place]) 12/29/2011  . Left Renal Hydronephrosis 12/11/2010  . 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 Celiac disease 05/27/2010  . Thyroid nodule 05/27/2010  . Male-to-male transgender person 09/20/2002    Jerl Mina ,PT, DPT, E-RYT  05/06/2018, 12:43 PM  [place] MAIN [facility] SERVICES [address] [place], [place], [address] Phone: [phone]   Fax:  [phone]  Name: Jesse Bowers MRN: [id number] Date of Birth: 11-28-1965

## 2018-05-07 ENCOUNTER — Other Ambulatory Visit: Payer: Self-pay | Admitting: Physician Assistant

## 2018-05-07 ENCOUNTER — Encounter: Payer: Self-pay | Admitting: Physician Assistant

## 2018-05-07 ENCOUNTER — Ambulatory Visit: Payer: Medicare Other | Admitting: Physician Assistant

## 2018-05-07 VITALS — BP 132/84 | HR 83 | Ht 68.0 in | Wt 221.8 lb

## 2018-05-07 DIAGNOSIS — Z8673 Personal history of transient ischemic attack (TIA), and cerebral infarction without residual deficits: Secondary | ICD-10-CM

## 2018-05-07 DIAGNOSIS — D6859 Other primary thrombophilia: Secondary | ICD-10-CM

## 2018-05-07 DIAGNOSIS — Z01818 Encounter for other preprocedural examination: Secondary | ICD-10-CM

## 2018-05-07 DIAGNOSIS — I5032 Chronic diastolic (congestive) heart failure: Secondary | ICD-10-CM | POA: Diagnosis not present

## 2018-05-07 DIAGNOSIS — G4733 Obstructive sleep apnea (adult) (pediatric): Secondary | ICD-10-CM | POA: Diagnosis not present

## 2018-05-07 MED ORDER — POTASSIUM CHLORIDE ER 10 MEQ PO TBCR: EXTENDED_RELEASE_TABLET | ORAL | 11 refills | Status: DC

## 2018-05-07 NOTE — Patient Instructions (Signed)
Medication Instructions:  Your physician recommends that you continue on your current medications as directed. Please refer to the Current Medication list given to you today.  If you need a refill on your cardiac medications before your next appointment, please call your pharmacy.   Lab work: NONE  If you have labs (blood work) drawn today and your tests are completely normal, you will receive your results only by: [name] MyChart Message (if you have MyChart) OR . A paper copy in the mail If you have any lab test that is abnormal or we need to change your treatment, we will call you to review the results.  Testing/Procedures: NONE  Follow-Up: At [facility], you and your health needs are our priority.  As part of our continuing mission to provide you with exceptional heart care, we have created designated Provider Care Teams.  These Care Teams include your primary Cardiologist (physician) and Advanced Practice Providers (APPs -  Physician Assistants and Nurse Practitioners) who all work together to provide you with the care you need, when you need it. You will need a follow up appointment in 12 months (MARCH 2021).  Please call our office in January 2021 to schedule this appointment.  You may see Sanda Klein, MD or one of the following Advanced Practice Providers on your designated Care Team: [place], [place] . Fabian Sharp, PA-C  Any Other Special Instructions Will Be Listed Below (If Applicable).

## 2018-05-07 NOTE — Progress Notes (Signed)
Cardiology Office Note    Date:  05/10/2018   ID:  Jesse Bowers, DOB 07/26/1965, MRN [id number]  PCP:  Concepcion Elk, MD  Cardiologist:  Dr. [name]  Chief Complaint  Patient presents with  . Follow-up    seen for Dr. [name]  . Pre-op Exam    upcoming total knee surgery by Dr. 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 He is a transgender male to male on chronic androgen therapy.  Last echocardiogram obtained on 12/15/2016 showed EF 60 to 65%, mild MR.  Patient presents today for preoperative clearance.  He has upcoming right knee surgery by Dr. Wynelle Link.  He appears to be euvolemic on physical exam.  His functional status is very poor due to significant arthritis.  However he denies any recent chest pain.  EKG is normal without significant ST-T wave changes.  Overall he is stable from cardiac perspective to proceed with surgery.  He will need to hold his Xarelto for 3 days and restart it as soon as possible afterward, however I did asked the patient to double check with his hematologist to make sure he does not need Lovenox bridging given prior history of protein C deficiency.    Past Medical History:  Diagnosis Date  . Abnormal weight loss   . Anxiety    PTSD per patient  . Arthritis   . Celiac disease   . Cervical neck pain with evidence of disc disease    patient has a cyst   . Degenerative disc disease at L5-S1 level    with stenosis  . 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 Family history of adverse reaction to anesthesia    family has problems with anesthesia of nausea and vomiting   . 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 Gluten enteropathy   . H/O parotitis    right   . History of kidney stones   . Hx-TIA (transient ischemic attack)    2015  . Neck pain   . Neuromuscular disorder ([place])      bilateral neuropathy feet.  . Pneumonia 12/17/2010  . Polycythemia   . Polycythemia, secondary   . PONV (postoperative nausea and vomiting)   . Protein C deficiency ([place])    Dr. Anne Fu  . Psoriasis   . psoriatic arthritis   . Scaphoid fracture of wrist 09/23/2013  . Sleep apnea    split night study last done by Dr. [name] 06/18/15 shows severe OSA, CSA, and hypersomnia, rec bipap  . Stenosis of ureteropelvic junction (UPJ)    left  . Stroke [facility])    CVA vs TIA in left cerebrum causing slight right sided weakness-Dr. [name] follows  . Syrinx of spinal cord ([place]) 01/06/2014   c spine on MRI  . Tachycardia    hx of   . Transfusion history    past history- none recent    Past Surgical History:  Procedure Laterality Date  . ABDOMINAL HYSTERECTOMY Bilateral 1994   TAH, BSO- tranverse incision at 53 yo  . ANKLE ARTHROSCOPY WITH RECONSTRUCTION Right 2007  . CHOLECYSTECTOMY     laparoscopic  . HIP ARTHROSCOPY W/ LABRAL REPAIR Right 05/11/2013   acetabular labral tear 03/30/2013  . KNEE SURGERY Bilateral 1984   Right ACL, left PCL repair  . LITHOTRIPSY  2005  . LIVER BIOPSY  2013   normal results.  [name] MASTECTOMY Bilateral    prior to 2009  . NASAL SEPTUM SURGERY N/A 09/20/2015   by ENT Dr. [name]  . OVARIAN CYST SURGERY Left    size of grapefruit, was informed that she had shortened vagina  . THYROIDECTOMY, PARTIAL Left 2008  . TOTAL KNEE REVISION Left 02/06/2016   Procedure: LEFT TOTAL KNEE REVISION;  Surgeon: [name], MD;  Location: [facility];  Service: Orthopedics;  Laterality: Left;  . TOTAL KNEE REVISION Left 04/22/2017   Procedure: Left knee polyethylene revision;  Surgeon: [name], MD;  Location: [facility];  Service: Orthopedics;  Laterality: Left;  . UPPER GI ENDOSCOPY  2003    Current Medications: Outpatient Medications Prior to Visit  Medication Sig Dispense Refill  . acyclovir ointment (ZOVIRAX) 5 % Apply 1 application topically daily as needed (cold  sores).   0  . baclofen (LIORESAL) 20 MG tablet Take 1 tablet (20 mg total) by mouth 4 (four) times daily. 120 tablet 3  . Calcipotriene-Betameth Diprop (ENSTILAR) 0.005-0.064 % FOAM Apply 1 application topically 2 (two) times daily.     . clorazepate (TRANXENE) 15 MG tablet Take 15 mg by mouth at bedtime.    . clorazepate (TRANXENE) 7.5 MG tablet Take 7.5 mg by mouth 2 (two) times daily.     [name] desonide (DESOWEN) 0.05 % ointment Apply 1 application topically 2 (two) times daily as needed (psoriasis).   2  . diclofenac sodium (VOLTAREN) 1 % GEL Apply 1 application topically 3 (three) times daily. Bilateral hands (Patient taking differently: Apply 1 application topically 3 (three) times daily as needed. Bilateral hands) 3 Tube 4  . furosemide (LASIX) 20 MG tablet TAKE 1 TAB BY MOUTH DAILY AS  NEEDED ,THIS IN ADDITION TABLET WITH 40  MG TABLET  AS NEEDED FOR WEIGHT GAIN OF 3LBS IN 1DAY OR 5LBS/WEEK (Patient taking differently: Take 20 mg by mouth See admin instructions. TAKE 20 MG TABLET  AS NEEDED FOR WEIGHT GAIN OF 3LBS IN 1DAY OR 5LBS/WEEK) 60 tablet 3  . furosemide (LASIX) 40 MG tablet Take 40 mg by mouth daily. TAKE 40 MG TABLET IN THE MORNINGS.  "PATIENT ALSO TAKES AN ADDITIONAL TABLET OF 20 MG FOR WEIGHT GAIN OF 3 POUNDS IN DAY OR 5 POUNDS IN A WEEK."    . mirabegron ER (MYRBETRIQ) 25 MG TB24 tablet Take 1 tablet (25 mg total) by mouth daily. 90 tablet 3  . morphine (KADIAN) 60 MG 24 hr capsule Take 1 capsule (60 mg total) by mouth every 12 (twelve) hours. 60 capsule 0  . NEEDLE, DISP, 18 G 18G X 1-1/2" MISC 1 Units by Does not apply route once a week. 50 each 2  . NEEDLE, DISP, 23 G 23G X 3/4" MISC 1 Units by Does not apply route once a week. 100 each 1  . neomycin-polymyxin-hydrocortisone (CORTISPORIN) 3.5-10000-1 OTIC suspension Place 4 drops into both ears 2 (two) times daily as needed (ear pain).   1  . NONFORMULARY OR COMPOUNDED ITEM Apply 1 application topically 3 (three) times daily. 8%  ketamine, 5% amitriptyline, 5% baclofen, 5% gabapentin  60 GM (Patient taking differently: Apply 1 application topically 3 (three) times daily as needed (pain). 8% ketamine, 5% amitriptyline, 5% baclofen, 5% gabapentin AS NEEDED  60 GM) 1 each 5  . Oxycodone HCl 10 MG TABS Take 1 tablet (10 mg total) by mouth every 12 (twelve) hours as needed ((score 7 to 10)). 60 tablet 0  .  prazosin (MINIPRESS) 2 MG capsule Take 4 mg by mouth at bedtime. 49m total    . rivaroxaban (XARELTO) 20 MG TABS tablet TAKE 1 TABLET DAILY BEFORE BEDTIME 90 tablet 1  . Secukinumab (COSENTYX) 150 MG/ML SOSY Inject 150 mg into the skin as directed. Inject every 28 days    . Syringe, Disposable, 1 ML MISC 1 Units by Does not apply route once a week. 60 each 2  . testosterone cypionate (DEPOTESTOSTERONE CYPIONATE) 200 MG/ML injection INJECT 0.5 MLS (100 MG TOTAL) INTO THE MUSCLE ONCE A WEEK. 10 mL 0  . ursodiol (ACTIGALL) 250 MG tablet Take 250 mg by mouth 2 (two) times daily.    . ursodiol (ACTIGALL) 500 MG tablet Take 500 mg by mouth 2 (two) times daily.     .Marland Kitchenvenlafaxine XR (EFFEXOR-XR) 150 MG 24 hr capsule Take 2 capsules (300 mg total) by mouth daily with breakfast. 60 capsule 5  . betamethasone dipropionate (DIPROLENE) 0.05 % cream Apply topically 2 (two) times daily.    . Methylnaltrexone Bromide (RELISTOR) 150 MG TABS Take 1-3 tablets by mouth every morning. 270 tablet 4  . potassium chloride (K-DUR) 10 MEQ tablet Take 1 tablet (10 mEq total) by mouth 2 (two) times daily. 200 tablet 2   No facility-administered medications prior to visit.      Allergies:   Penicillin g; Sulfa antibiotics; Vancomycin; Wheat extract; Duloxetine; Gabapentin; Tegaderm ag mesh [silver]; Citalopram; Acetaminophen; Ibuprofen; Nortriptyline; Pregabalin; and Sulfacetamide sodium-sulfur   Social History   Socioeconomic History  . Marital status: Married    Spouse name: Not on file  . Number of children: 2  . Years of education: 4y  college  . Highest education level: Not on file  Occupational History  . Occupation: Pediatric Nurse practitioner    Comment: Not working since [place]  Social Needs  . Financial resource strain: Not on file  . Food insecurity:    Worry: Not on file    Inability: Not on file  . Transportation needs:    Medical: Not on file    Non-medical: Not on file  Tobacco Use  . Smoking status: Never Smoker  . Smokeless tobacco: Never Used  Substance and Sexual Activity  . Alcohol use: No    Frequency: Never  . Drug use: No  . Sexual activity: Yes    Birth control/protection: None    Comment: patient is a transgender on testosterone shots, no biological kids  Lifestyle  . Physical activity:    Days per week: Not on file    Minutes per session: Not on file  . Stress: Not on file  Relationships  . Social connections:    Talks on phone: Not on file    Gets together: Not on file    Attends religious service: Not on file    Active member of club or organization: Not on file    Attends meetings of clubs or organizations: Not on file    Relationship status: Not on file  Other Topics Concern  . 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 [place] was in [place] years [place] His wife is working as HScientist, research (physical sciences)for [employer] Patient is not working and applying for disability. They have 2 kids but no  biologic children.      Family History:  The patient's family history includes Cancer in his paternal grandfather; Congestive Heart Failure in his maternal grandmother; Heart attack in his maternal grandfather, maternal grandmother, and paternal grandfather; Hypertension in his mother; Other in his mother; Polycythemia in his paternal uncle; Protein C deficiency (age of onset: 52)  in his sister; Psoriasis in his mother; Stroke in his maternal grandfather, maternal grandmother, and paternal uncle.   ROS:   Please see the history of present illness.    ROS All other systems reviewed and are negative.   PHYSICAL EXAM:   VS:  BP 132/84   Pulse 83   Ht 5' 8"  (1.727 m)   Wt 221 lb 12.8 oz (100.6 kg)   BMI 33.72 kg/m    GEN: Well nourished, well developed, in no acute distress  HEENT: normal  Neck: no JVD, carotid bruits, or masses Cardiac: RRR; no murmurs, rubs, or gallops,no edema  Respiratory:  clear to auscultation bilaterally, normal work of breathing GI: soft, nontender, nondistended, + BS MS: no deformity or atrophy  Skin: warm and dry, no rash Neuro:  Alert and Oriented x 3, Strength and sensation are intact Psych: euthymic mood, full affect  Wt Readings from Last 3 Encounters:  05/07/18 221 lb 12.8 oz (100.6 kg)  03/30/18 228 lb (103.4 kg)  02/05/18 218 lb (98.9 kg)      Studies/Labs Reviewed:   EKG:  EKG is ordered today.  The ekg ordered today demonstrates NSR without significant ST-T wave changes  Recent Labs: 04/13/2018: Hemoglobin 13.2; Platelets 187 04/29/2018: ALT 24; BUN 15; Creatinine, Ser 1.11; Potassium 4.3; Sodium 139; TSH 1.370   Lipid Panel    Component Value Date/Time   CHOL 142 01/05/2018 1509   TRIG 45 01/05/2018 1509   HDL 52 01/05/2018 1509   CHOLHDL 2.7 01/05/2018 1509   LDLCALC 81 01/05/2018 1509    Additional studies/ records that were reviewed today include:   Echo 12/15/2016 LV EF: 60% -   65% Study Conclusions  - Left ventricle: The cavity size was normal. Wall thickness was   increased in a pattern of mild LVH. Systolic function was normal.   The estimated ejection fraction was in the range of 60% to 65%.   Wall motion was normal; there were no regional wall motion   abnormalities. Left ventricular diastolic function parameters   were normal. - Mitral valve: There was mild regurgitation. - Left atrium:  The atrium was mildly dilated. - Atrial septum: No defect or patent foramen ovale was identified.   ASSESSMENT:    1. Pre-operative clearance   2. Chronic diastolic (congestive) heart failure ([place])   3. OSA (obstructive sleep apnea)   4. Protein C deficiency ([place])   5. H/O: CVA (cerebrovascular accident)      PLAN:  In order of problems listed above:  1. Preoperative clearance: Upcoming total knee surgery by Dr. Wynelle Link, he will need to hold Xarelto for 3 days prior to surgery and restart as soon as possible afterward.  I checked with our clinical pharmacist, holding the Xarelto for 3 days is quite appropriate in this case.  However we recommend he double check with his hematologist to make sure he does not need Lovenox bridging given history of protein C deficiency and a higher risk for stroke.  Overall he is quite stable from cardiology perspective to proceed with surgery without further work-up.  Although he does walk at home, his functional ability is  quite limited due to significant back pain and knee pain, he gets around using an electrical motor chair  2. Chronic diastolic heart failure: Euvolemic by physical exam.  3. Central sleep apnea: On BiPAP therapy at home  4. Protein C deficiency: History of CVA, high risk for stroke.  Continue Xarelto.  See #1 regarding holding Xarelto prior to surgery.    Medication Adjustments/Labs and Tests Ordered: Current medicines are reviewed at length with the patient today.  Concerns regarding medicines are outlined above.  Medication changes, Labs and Tests ordered today are listed in the Patient Instructions below. Patient Instructions  Medication Instructions:  Your physician recommends that you continue on your current medications as directed. Please refer to the Current Medication list given to you today.  If you need a refill on your cardiac medications before your next appointment, please call your pharmacy.   Lab work: NONE  If  you have labs (blood work) drawn today and your tests are completely normal, you will receive your results only by: [name] MyChart Message (if you have MyChart) OR . A paper copy in the mail If you have any lab test that is abnormal or we need to change your treatment, we will call you to review the results.  Testing/Procedures: NONE  Follow-Up: At [facility], you and your health needs are our priority.  As part of our continuing mission to provide you with exceptional heart care, we have created designated Provider Care Teams.  These Care Teams include your primary Cardiologist (physician) and Advanced Practice Providers (APPs -  Physician Assistants and Nurse Practitioners) who all work together to provide you with the care you need, when you need it. You will need a follow up appointment in 12 months (MARCH 2021).  Please call our office in January 2021 to schedule this appointment.  You may see Sanda Klein, MD or one of the following Advanced Practice Providers on your designated Care Team: [place], [place] . Fabian Sharp, PA-C  Any Other Special Instructions Will Be Listed Below (If Applicable).       Hilbert Corrigan, [place]  05/10/2018 12:03 AM    [address], [place], [place]  [address] Phone: [phone]; Fax: [phone]

## 2018-05-10 ENCOUNTER — Encounter (HOSPITAL_COMMUNITY): Payer: Self-pay

## 2018-05-10 ENCOUNTER — Encounter: Payer: Self-pay | Admitting: Physician Assistant

## 2018-05-10 NOTE — Patient Instructions (Addendum)
Your procedure is scheduled on: Monday, May 17, 2018   Surgery Time:  1:45PM-2:35PM   Report to [place]  Entrance    Report to admitting at 11:15 AM   Call this number if you have problems the morning of surgery [phone]   Bring Bi-Pap mask and tubing   Do not eat food:After Midnight.   May have liquids until 7:45AM day of surgery   CLEAR LIQUID DIET  Foods Allowed                                                                     Foods Excluded  Water, Black Coffee and tea, regular and decaf                             liquids that you cannot  Plain Jell-O in any flavor                                             see through such as: Fruit ices (not with fruit pulp)                                     milk, soups, orange juice  Iced Popsicles                                    All solid food Carbonated beverages, regular and diet                                    Cranberry, grape and apple juices Sports drinks like Gatorade Lightly seasoned clear broth or consume(fat free) Sugar, honey syrup  Sample Menu Breakfast                                Lunch                                     Supper Cranberry juice                    Beef broth                            Chicken broth Jell-O                                     Grape juice                           Apple juice Coffee or tea  Jell-O                                      Popsicle                                                Coffee or tea                        Coffee or tea   Brush your teeth the morning of surgery.   Do NOT smoke after Midnight   Take these medicines the morning of surgery with A SIP OF WATER: Clorazepate, Myrbetriq, Morphine, Venlafaxine   Use eye drops per normal routine                 You may not have any metal on your body including jewelry, and body piercings             Do not wear lotions, powders, perfumes/cologne, or deodorant                           Men may shave face and neck.   Do not bring valuables to the hospital. [place].   Contacts, dentures or bridgework may not be worn into surgery.   Leave suitcase in the car. After surgery it may be brought to your room.   Special Instructions: Bring a copy of your healthcare power of attorney and living will documents         the day of surgery if you haven't scanned them in before.              Please read over the following fact sheets you were given:  [facility] - Preparing for Surgery Before surgery, you can play an important role.  Because skin is not sterile, your skin needs to be as free of germs as possible.  You can reduce the number of germs on your skin by washing with CHG (chlorahexidine gluconate) soap before surgery.  CHG is an antiseptic cleaner which kills germs and bonds with the skin to continue killing germs even after washing. Please DO NOT use if you have an allergy to CHG or antibacterial soaps.  If your skin becomes reddened/irritated stop using the CHG and inform your nurse when you arrive at Short Stay. Do not shave (including legs and underarms) for at least 48 hours prior to the first CHG shower.  You may shave your face/neck.  Please follow these instructions carefully:  1.  Shower with CHG Soap the night before surgery and the  morning of surgery.  2.  If you choose to wash your hair, wash your hair first as usual with your normal  shampoo.  3.  After you shampoo, rinse your hair and body thoroughly to remove the shampoo.                             4.  Use CHG as you would any other liquid soap.  You can apply chg directly to the skin and wash.  Gently with  a scrungie or clean washcloth.  5.  Apply the CHG Soap to your body ONLY FROM THE NECK DOWN.   Do   not use on face/ open                           Wound or open sores. Avoid contact with eyes, ears mouth and   genitals (private parts).                        Wash face,  Genitals (private parts) with your normal soap.             6.  Wash thoroughly, paying special attention to the area where your    surgery  will be performed.  7.  Thoroughly rinse your body with warm water from the neck down.  8.  DO NOT shower/wash with your normal soap after using and rinsing off the CHG Soap.                9.  Pat yourself dry with a clean towel.            10.  Wear clean pajamas.            11.  Place clean sheets on your bed the night of your first shower and do not  sleep with pets. Day of Surgery : Do not apply any lotions/deodorants the morning of surgery.  Please wear clean clothes to the hospital/surgery center.  FAILURE TO FOLLOW THESE INSTRUCTIONS MAY RESULT IN THE CANCELLATION OF YOUR SURGERY  PATIENT SIGNATURE_________________________________  NURSE SIGNATURE__________________________________  ________________________________________________________________________   [name]  An incentive spirometer is a tool that can help keep your lungs clear and active. This tool measures how well you are filling your lungs with each breath. Taking long deep breaths may help reverse or decrease the chance of developing breathing (pulmonary) problems (especially infection) following:  A long period of time when you are unable to move or be active. BEFORE THE PROCEDURE   If the spirometer includes an indicator to show your best effort, your nurse or respiratory therapist will set it to a desired goal.  If possible, sit up straight or lean slightly forward. Try not to slouch.  Hold the incentive spirometer in an upright position. INSTRUCTIONS FOR USE  1. Sit on the edge of your bed if possible, or sit up as far as you can in bed or on a chair. 2. Hold the incentive spirometer in an upright position. 3. Breathe out normally. 4. Place the mouthpiece in your mouth and seal your lips tightly around it. 5. Breathe in  slowly and as deeply as possible, raising the piston or the ball toward the top of the column. 6. Hold your breath for 3-5 seconds or for as long as possible. Allow the piston or ball to fall to the bottom of the column. 7. Remove the mouthpiece from your mouth and breathe out normally. 8. Rest for a few seconds and repeat Steps 1 through 7 at least 10 times every 1-2 hours when you are awake. Take your time and take a few normal breaths between deep breaths. 9. The spirometer may include an indicator to show your best effort. Use the indicator as a goal to work toward during each repetition. 10. After each set of 10 deep breaths, practice coughing to be sure your lungs are clear. If you have an incision (the cut  made at the time of surgery), support your incision when coughing by placing a pillow or rolled up towels firmly against it. Once you are able to get out of bed, walk around indoors and cough well. You may stop using the incentive spirometer when instructed by your caregiver.  RISKS AND COMPLICATIONS  Take your time so you do not get dizzy or light-headed.  If you are in pain, you may need to take or ask for pain medication before doing incentive spirometry. It is harder to take a deep breath if you are having pain. AFTER USE  Rest and breathe slowly and easily.  It can be helpful to keep track of a log of your progress. Your caregiver can provide you with a simple table to help with this. If you are using the spirometer at home, follow these instructions: [place] IF:   You are having difficultly using the spirometer.  You have trouble using the spirometer as often as instructed.  Your pain medication is not giving enough relief while using the spirometer.  You develop fever of 100.5 F (38.1 C) or higher. SEEK IMMEDIATE MEDICAL CARE IF:   You cough up bloody sputum that had not been present before.  You develop fever of 102 F (38.9 C) or greater.  You develop  worsening pain at or near the incision site. MAKE SURE YOU:   Understand these instructions.  Will watch your condition.  Will get help right away if you are not doing well or get worse. Document Released: 06/30/2006 Document Revised: 05/12/2011 Document Reviewed: 08/31/2006 [employer] Patient Information 2014 [employer], [place].   ________________________________________________________________________  WHAT IS A BLOOD TRANSFUSION? Blood Transfusion Information  A transfusion is the replacement of blood or some of its parts. Blood is made up of multiple cells which provide different functions.  Red blood cells carry oxygen and are used for blood loss replacement.  White blood cells fight against infection.  Platelets control bleeding.  Plasma helps clot blood.  Other blood products are available for specialized needs, such as hemophilia or other clotting disorders. BEFORE THE TRANSFUSION  Who gives blood for transfusions?   Healthy volunteers who are fully evaluated to make sure their blood is safe. This is blood bank blood. Transfusion therapy is the safest it has ever been in the practice of medicine. Before blood is taken from a donor, a complete history is taken to make sure that person has no history of diseases nor engages in risky social behavior (examples are intravenous drug use or sexual activity with multiple partners). The donor's travel history is screened to minimize risk of transmitting infections, such as malaria. The donated blood is tested for signs of infectious diseases, such as HIV and hepatitis. The blood is then tested to be sure it is compatible with you in order to minimize the chance of a transfusion reaction. If you or a relative donates blood, this is often done in anticipation of surgery and is not appropriate for emergency situations. It takes many days to process the donated blood. RISKS AND COMPLICATIONS Although transfusion therapy is very safe and saves  many lives, the main dangers of transfusion include:   Getting an infectious disease.  Developing a transfusion reaction. This is an allergic reaction to something in the blood you were given. Every precaution is taken to prevent this. The decision to have a blood transfusion has been considered carefully by your caregiver before blood is given. Blood is not given unless the benefits  outweigh the risks. AFTER THE TRANSFUSION  Right after receiving a blood transfusion, you will usually feel much better and more energetic. This is especially true if your red blood cells have gotten low (anemic). The transfusion raises the level of the red blood cells which carry oxygen, and this usually causes an energy increase.  The nurse administering the transfusion will monitor you carefully for complications. HOME CARE INSTRUCTIONS  No special instructions are needed after a transfusion. You may find your energy is better. Speak with your caregiver about any limitations on activity for underlying diseases you may have. SEEK MEDICAL CARE IF:   Your condition is not improving after your transfusion.  You develop redness or irritation at the intravenous (IV) site. SEEK IMMEDIATE MEDICAL CARE IF:  Any of the following symptoms occur over the next 12 hours:  Shaking chills.  You have a temperature by mouth above 102 F (38.9 C), not controlled by medicine.  Chest, back, or muscle pain.  People around you feel you are not acting correctly or are confused.  Shortness of breath or difficulty breathing.  Dizziness and fainting.  You get a rash or develop hives.  You have a decrease in urine output.  Your urine turns a dark color or changes to pink, red, or brown. Any of the following symptoms occur over the next 10 days:  You have a temperature by mouth above 102 F (38.9 C), not controlled by medicine.  Shortness of breath.  Weakness after normal activity.  The white part of the eye turns  yellow (jaundice).  You have a decrease in the amount of urine or are urinating less often.  Your urine turns a dark color or changes to pink, red, or brown. Document Released: 02/15/2000 Document Revised: 05/12/2011 Document Reviewed: 10/04/2007 [facility] Patient Information 2014 [place], [place].  _______________________________________________________________________

## 2018-05-10 NOTE — Pre-Procedure Instructions (Signed)
The following are in epic: EKG 05/07/2018 Cardiac clearance Dr. Eulas Post 05/07/2018  Surgical clearance Dr. Loistine Simas 04/20/2018 in care everywhere

## 2018-05-10 NOTE — Progress Notes (Signed)
05/11/2018 11:52 AM   Jesse Bowers [birth year] [id number]  Referring provider: Shawnee Knapp, MD [address] [place], [place] [address]  No chief complaint on file.   HPI: Jesse Bowers is a 53 y.o. transgender male (male to male) Caucasian with a history of left hydronephrosis, frequency and incontinence who presents today for a three month follow up for frequency and incontinence.  Background history Patient is a 53 year old transgender male (male to male) with a history of mild left hydronephrosis who is referred by Dr. [name] for hesitancy, frequency and incontinence.  Contrast CT in 2017 noted mild chronic hydronephrosis.  He is having flank pain at times.   Today, he complains of frequency x 10-15, strong urgency (he had the urge to void three times during the visit), nocturia x 1-3 (sometimes take lasix in the evening and has sleep apnea and sleeps with BiPAP), incontinence and hesitancy.  These symptoms started 6 months ago.  Patient denies any gross hematuria, dysuria or suprapubic/flank pain.  Patient denies any fevers, chills, nausea or vomiting.  His UA is negative.  His PVR is 34 mL.  He is suffering from constipation.   He has a small can of diet Coke daily, otherwise mostly water.    At his visit on 12/07/2017, he was started on Myrbetriq 25 mg daily.  On his 12/29/2017 visit he stated that since taking the Myrbetriq he has had episodes of and frequent urination.  He stated that there are occasions where the stream was slow and he had difficulty starting the urinary stream.  He stated the Myrbetriq is helpful as it is decreased his urinary frequency and nocturia.  His I PSS score 22/5-6, PVR was 107 mL.  He was still experiencing left-sided flank pain.  RUS on 12/21/2017 revealed slightly more fullness of the left pelvocaliceal system than was noted on the ultrasound of 11/27/2016 possibly due to the history of chronic UPJ narrowing.  No renal calculi are seen. There is mild  cortical thinning bilaterally.  A CT Hematuria workup on 01/13/2018 returned the impression by Dr. Candise Che of: 1. Small lower pole left renal calculi but no obstructing ureteral calculi or bladder calculi.  2. 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  3. No worrisome renal or bladder lesions.  4. No acute abdominal/pelvic findings, mass lesions or adenopathy.  5. Status post cholecystectomy. Mild stable associated common bile duct dilatation.  Renal lasix scan on 01/26/2018 noted normally functioning right kidney.  There is mild decreased perfusion and cortical uptake by the left kidney. Decreased and delayed clearance of the radiopharmaceutical compatible with partial obstruction.  Split renal function is equal to 44.5% from the left kidney and 55.5% from the right kidney.  On his visit today (05/11/2018), he reports his symptoms have overall improved but "When I gotta go, I go."  He says he's still mixed because he is working with the pelvic PT and he may have scar tissue in his urethra from previous catheterizations.  He reports that his sensation of if he needs to urinate goes from 0-100 with no between, and it happens almost every time, unless he's told to go and in the morning.  He says he does not get the 'warning shot' but he is not going 12-15 times a day and does not have nocturia unless he takes his medications late, as he does have to take his medications with water, but he does not always need to even then.  He  also cannot tell if he's completely done.  Patient is not sure if he wishes to increase his Myrbetriq dosage, given that he is about to undergo surgery soon, but was agreeable to raising his dosage.  IPSS score: 16/3  PVR: 16 mL  IPSS    Row Name 05/11/18 1100         International Prostate Symptom Score   How often have you had the sensation of not emptying your bladder?  About half the time     How often have you had to urinate less than every two hours?   Less than half the time     How often have you found you stopped and started again several times when you urinated?  More than half the time     How often have you found it difficult to postpone urination?  Less than half the time     How often have you had a weak urinary stream?  About half the time     How often have you had to strain to start urination?  Less than 1 in 5 times     How many times did you typically get up at night to urinate?  1 Time     Total IPSS Score  16       Quality of Life due to urinary symptoms   If you were to spend the rest of your life with your urinary condition just the way it is now how would you feel about that?  Mixed       Score:  1-7 Mild 8-19 Moderate 20-35 Severe  PMH: Past Medical History:  Diagnosis Date  . Abnormal weight loss   . Anxiety    PTSD per patient  . Arthritis   . Celiac disease   . Cervical neck pain with evidence of disc disease    patient has a cyst   . Chronic constipation   . Chronic diastolic heart failure ([place])   . Degenerative disc disease at L5-S1 level    with stenosis  . Elevated liver enzymes   . 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 Family history of adverse reaction to anesthesia    family has problems with anesthesia of nausea and vomiting   . 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 GERD (gastroesophageal reflux disease)   . Gluten enteropathy   . H/O parotitis    right   . History of kidney stones   . Hx-TIA (transient ischemic attack)    2015  . LVH (left ventricular hypertrophy) 12/15/2016   Mild, noted on ECHO  . NAFL (nonalcoholic fatty liver)   . Neck pain   . Neuromuscular disorder ([place])    bilateral neuropathy feet.  . Pneumonia 12/17/2010  . Polycythemia   . Polycythemia, secondary   . PONV (postoperative nausea and vomiting)   . Protein C deficiency ([place])    Dr. Anne Fu  . Psoriasis   . psoriatic arthritis   . Scaphoid fracture of wrist 09/23/2013  . Sleep apnea     split night study last done by Dr. [name] 06/18/15 shows severe OSA, CSA, and hypersomnia, rec bipap  . Splenomegaly   . Stenosis of ureteropelvic junction (UPJ)    left  . 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 [name] follows  . Syrinx of spinal cord ([place]) 01/06/2014   c spine on MRI  . Tachycardia    hx of   .  Transfusion history    past history- none recent    Surgical History: Past Surgical History:  Procedure Laterality Date  . ABDOMINAL HYSTERECTOMY Bilateral 1994   TAH, BSO- tranverse incision at 53 yo  . ANKLE ARTHROSCOPY WITH RECONSTRUCTION Right 2007  . CHOLECYSTECTOMY     laparoscopic  . HIP ARTHROSCOPY W/ LABRAL REPAIR Right 05/11/2013   acetabular labral tear 03/30/2013  . KNEE SURGERY Bilateral 1984   Right ACL, left PCL repair  . LITHOTRIPSY  2005  . LIVER BIOPSY  2013   normal results.  [name] MASTECTOMY Bilateral    prior to 2009  . MOUTH SURGERY    . NASAL SEPTUM SURGERY N/A 09/20/2015   by ENT Dr. [name]  . OVARIAN CYST SURGERY Left    size of grapefruit, was informed that she had shortened vagina  . SHOULDER SURGERY    . THYROIDECTOMY, PARTIAL Left 2008  . TOTAL KNEE REVISION Left 02/06/2016   Procedure: LEFT TOTAL KNEE REVISION;  Surgeon: [name], MD;  Location: [facility] ORS;  Service: Orthopedics;  Laterality: Left;  . TOTAL KNEE REVISION Left 04/22/2017   Procedure: Left knee polyethylene revision;  Surgeon: [name], MD;  Location: [facility];  Service: Orthopedics;  Laterality: Left;  . UPPER GI ENDOSCOPY  2003    Home Medications:  Allergies as of 05/11/2018      Reactions   Penicillin G Anaphylaxis, Other (See Comments)   Has patient had a PCN reaction causing immediate rash, facial/tongue/throat swelling, SOB or lightheadedness with hypotension: Yes Has patient had a PCN reaction causing severe rash involving mucus membranes or skin necrosis: No Has patient had a PCN reaction that required  hospitalization Yes Has patient had a PCN reaction occurring within the last 10 years: No If all of the above answers are "NO", then may proceed with Cephalosporin use.   Sulfa Antibiotics Rash   Stevens-Johnson rash   Vancomycin Rash, Other (See Comments)   RED MAN SYNDROME CAN HAVE IF GIVEN OVER 2HOURS   Duloxetine Other (See Comments)   Restless legs   Gabapentin Nausea Only, Other (See Comments)   Other reaction(s): nausea, mental status Drowsiness and restlessness.   Tegaderm Ag Mesh [silver] Dermatitis   Causes blistering wounds   Citalopram Other (See Comments)   Dystonia   Acetaminophen Other (See Comments)   Elevates liver enzymes.    Ibuprofen Other (See Comments)   Contraindicated with Xarelto.    Nortriptyline Other (See Comments)   Dry mouth at 25 mg dose.  Tolerates 10 mg dose   Pregabalin Other (See Comments)   Ineffective   Sulfacetamide Sodium-sulfur Rash      Medication List       Accurate as of May 11, 2018 11:52 AM. Always use your most recent med list.        acyclovir ointment 5 % Commonly known as:  ZOVIRAX Apply 1 application topically daily as needed (cold sores).   B-D 3CC LUER-LOK SYR 18GX1-1/2 18G X 1-1/2" 3 ML Misc Generic drug:  SYRINGE-NEEDLE (DISP) 3 ML BD Luer-Lok Syringe 3 mL 18 x 1 1/2"  USE ONCE A WEEK   baclofen 20 MG tablet Commonly known as:  LIORESAL Take 1 tablet (20 mg total) by mouth 4 (four) times daily.   clorazepate 7.5 MG tablet Commonly known as:  TRANXENE Take 7.5 mg by mouth 2 (two) times daily.   clorazepate 15 MG tablet Commonly known as:  TRANXENE Take 15 mg by mouth at bedtime.  Cosentyx 150 MG/ML Sosy Generic drug:  Secukinumab Inject 150 mg into the skin as directed. Inject every 28 days   Cosentyx Sensoready Pen 150 MG/ML [place] Generic drug:  Secukinumab Cosentyx Pen 300 mg/2 Pens (150 mg/mL) subcutaneous   desonide 0.05 % ointment Commonly known as:  DESOWEN Apply 1 application topically 2  (two) times daily as needed (psoriasis).   diclofenac sodium 1 % Gel Commonly known as:  VOLTAREN Apply 1 application topically 3 (three) times daily. Bilateral hands   Enstilar 0.005-0.064 % Foam Generic drug:  Calcipotriene-Betameth Diprop Apply 1 application topically 2 (two) times daily.   furosemide 20 MG tablet Commonly known as:  LASIX TAKE 1 TAB BY MOUTH DAILY AS  NEEDED ,THIS IN ADDITION TABLET WITH 40  MG TABLET  AS NEEDED FOR WEIGHT GAIN OF 3LBS IN 1DAY OR 5LBS/WEEK   MELATONIN PLUS L-THEANINE PO Take 2 each by mouth at bedtime.   mirabegron ER 25 MG Tb24 tablet Commonly known as:  MYRBETRIQ Take 1 tablet (25 mg total) by mouth daily.   morphine 60 MG 24 hr capsule Commonly known as:  [place] Take 1 capsule (60 mg total) by mouth every 12 (twelve) hours.   NEEDLE (DISP) 18 G 18G X 1-1/2" Misc 1 Units by Does not apply route once a week.   NEEDLE (DISP) 23 G 23G X 1" Misc Hypodermic Needles 23 gauge x 1"  USE ONCE A WEEK   NEEDLE (DISP) 23 G 23G X 3/4" Misc 1 Units by Does not apply route once a week.   neomycin-polymyxin-hydrocortisone 3.5-10000-1 OTIC suspension Commonly known as:  CORTISPORIN Place 4 drops into both ears 2 (two) times daily as needed (ear pain).   NONFORMULARY OR COMPOUNDED ITEM Apply 1 application topically 3 (three) times daily. 8% ketamine, 5% amitriptyline, 5% baclofen, 5% gabapentin  60 GM   Oxycodone HCl 10 MG Tabs Take 1 tablet (10 mg total) by mouth every 12 (twelve) hours as needed ((score 7 to 10)).   potassium chloride 10 MEQ tablet Commonly known as:  K-DUR potassium chloride ER 10 mEq tablet,extended release   potassium chloride 10 MEQ tablet Commonly known as:  K-DUR TAKE K-DUR 2 TABLETS 10 MEQ DAILY   prazosin 2 MG capsule Commonly known as:  MINIPRESS Take 4 mg by mouth at bedtime. 63m total   rivaroxaban 20 MG Tabs tablet Commonly known as:  Xarelto TAKE 1 TABLET DAILY BEFORE BEDTIME   Syringe (Disposable) 1  ML Misc 1 Units by Does not apply route once a week.   Systane Complete 0.6 % Soln Generic drug:  Propylene Glycol Place 1 drop into both eyes 2 (two) times daily.   Testosterone Cypionate 200 MG/ML Soln testosterone cypionate 200 mg/mL intramuscular oil   testosterone cypionate 200 MG/ML injection Commonly known as:  DEPOTESTOSTERONE CYPIONATE INJECT 0.5 MLS (100 MG TOTAL) INTO THE MUSCLE ONCE A WEEK.   ursodiol 500 MG tablet Commonly known as:  ACTIGALL Take 500 mg by mouth 2 (two) times daily.   ursodiol 250 MG tablet Commonly known as:  ACTIGALL Take 250 mg by mouth 2 (two) times daily.   venlafaxine XR 150 MG 24 hr capsule Commonly known as:  EFFEXOR-XR Take 2 capsules (300 mg total) by mouth daily with breakfast.       Allergies:  Allergies  Allergen Reactions  . Penicillin G Anaphylaxis and Other (See Comments)    Has patient had a PCN reaction causing immediate rash, facial/tongue/throat swelling, SOB or lightheadedness with hypotension:  Yes Has patient had a PCN reaction causing severe rash involving mucus membranes or skin necrosis: No Has patient had a PCN reaction that required hospitalization Yes Has patient had a PCN reaction occurring within the last 10 years: No If all of the above answers are "NO", then may proceed with Cephalosporin use.   . Sulfa Antibiotics Rash    Stevens-Johnson rash  . Vancomycin Rash and Other (See Comments)    RED MAN SYNDROME CAN HAVE IF GIVEN OVER 2HOURS  . Duloxetine Other (See Comments)    Restless legs  . Gabapentin Nausea Only and Other (See Comments)    Other reaction(s): nausea, mental status Drowsiness and restlessness.  . Tegaderm Ag Mesh [Silver] Dermatitis    Causes blistering wounds   . Citalopram Other (See Comments)    Dystonia  . Acetaminophen Other (See Comments)    Elevates liver enzymes.   . Ibuprofen Other (See Comments)    Contraindicated with Xarelto.   . Nortriptyline Other (See Comments)     Dry mouth at 25 mg dose.  Tolerates 10 mg dose  . Pregabalin Other (See Comments)    Ineffective  . Sulfacetamide Sodium-Sulfur Rash    Family History: Family History  Problem Relation Age of Onset  . Stroke Maternal Grandfather        71  . Heart attack Maternal Grandfather   . Hypertension Mother   . Psoriasis Mother   . Other Mother        meningioma developed ~2019  . Cancer Paternal Grandfather   . Heart attack Paternal Grandfather   . Stroke Paternal Uncle        age 41  . Polycythemia Paternal Uncle   . Stroke Maternal Grandmother   . Congestive Heart Failure Maternal Grandmother   . Heart attack Maternal Grandmother   . Protein C deficiency Sister 47       Miscarriages    Social History:  reports that he has never smoked. He has never used smokeless tobacco. He reports that he does not drink alcohol or use drugs.  ROS:  UROLOGY Frequent Urination?: No Hard to postpone urination?: Yes Burning/pain with urination?: No Get up at night to urinate?: No Leakage of urine?: No Urine stream starts and stops?: Yes Trouble starting stream?: No Do you have to strain to urinate?: No Blood in urine?: No Urinary tract infection?: No Sexually transmitted disease?: No Injury to kidneys or bladder?: No Painful intercourse?: No Weak stream?: Yes Erection problems?: No Penile pain?: No Currently pregnant?: No Vaginal bleeding?: No Last menstrual period?: n  Gastrointestinal Nausea?: No Vomiting?: No Indigestion/heartburn?: No Diarrhea?: No Constipation?: Yes  Constitutional Fever: No Night sweats?: No Weight loss?: No Fatigue?: Yes  Skin Skin rash/lesions?: Yes Itching?: Yes  Eyes Blurred vision?: No Double vision?: No  Ears/Nose/Throat Sore throat?: No Sinus problems?: No  Hematologic/Lymphatic Swollen glands?: No Easy bruising?: No  Cardiovascular Leg swelling?: Yes Chest pain?: No  Respiratory Cough?: No Shortness of breath?:  No  Endocrine Excessive thirst?: Yes  Musculoskeletal Back pain?: Yes Joint pain?: Yes  Neurological Headaches?: No Dizziness?: No  Psychologic Depression?: No Anxiety?: Yes  Physical Exam: BP 131/74   Pulse (!) 101   SpO2 96%   Constitutional:  Well nourished. Alert and oriented, No acute distress. HEENT: [place] AT, moist mucus membranes.  Trachea midline Cardiovascular: No clubbing, cyanosis, or edema. Respiratory: Normal respiratory effort, no increased work of breathing. Skin: No rashes, bruises or suspicious lesions. Neurologic: Grossly intact, no focal  deficits, moving all 4 extremities. Psychiatric: Normal mood and affect.  Laboratory Data: Lab Results  Component Value Date   WBC 3.7 (L) 04/13/2018   HGB 13.2 04/13/2018   HCT 44.2 04/13/2018   MCV 79.2 (L) 04/13/2018   PLT 187 04/13/2018    Lab Results  Component Value Date   CREATININE 1.11 04/29/2018   Lab Results  Component Value Date   TESTOSTERONE 1,129 (H) 04/29/2018   Lab Results  Component Value Date   TSH 1.370 04/29/2018      Component Value Date/Time   CHOL 142 01/05/2018 1509   HDL 52 01/05/2018 1509   CHOLHDL 2.7 01/05/2018 1509   LDLCALC 81 01/05/2018 1509   Lab Results  Component Value Date   AST 27 04/29/2018   Lab Results  Component Value Date   ALT 24 04/29/2018   I have reviewed the labs.  Pertinent Imaging: Results for orders placed or performed in visit on 05/11/18  Bladder Scan (Post Void Residual) in office  Result Value Ref Range   Scan Result 26m    Assessment & Plan:    1. Left hydronephrosis Due to chronic UPJ obstruction - continue to monitor closely Most recent creatinine 1.01 - which is baseline Will consider repeating renal lasix scan soon once the COVID-19 precautions have lifted as he would be at high risk with the virus   2. Urgency/urge incontinence Some improvement with Myrbetriq 25 mg daily - will increase to Myrbetriq 50 mg daily once he has  had his knee surgery and is ambulatory  If PT does not help or increase in Myrbetriq does not help patient to reach goal, urodynamics will be necessary; patient's questions have been answered to his satisfaction - Patient will call to make appointment 3 weeks after starting trial for PVR and recheck  3. Incontinence - See above  Return for patient to call .  SZara Council PA-C  [facility] [address] [place] [phone] I, KAdele Schilder am acting as a sEducation administratorfor SConstellation [employer] PA-C.   I have reviewed the above documentation for accuracy and completeness, and I agree with the above.    SZara Council PA-C

## 2018-05-11 ENCOUNTER — Encounter (HOSPITAL_COMMUNITY)
Admission: RE | Admit: 2018-05-11 | Discharge: 2018-05-11 | Disposition: A | Discharge status: Home / Self Care | Payer: Medicare Other | Source: Ambulatory Visit | Attending: Orthopedic Surgery | Admitting: Orthopedic Surgery

## 2018-05-11 ENCOUNTER — Other Ambulatory Visit: Payer: Self-pay

## 2018-05-11 ENCOUNTER — Encounter (HOSPITAL_COMMUNITY): Payer: Self-pay

## 2018-05-11 ENCOUNTER — Ambulatory Visit (INDEPENDENT_AMBULATORY_CARE_PROVIDER_SITE_OTHER): Payer: Medicare Other | Admitting: Urology

## 2018-05-11 ENCOUNTER — Ambulatory Visit (HOSPITAL_COMMUNITY)
Admission: RE | Admit: 2018-05-11 | Discharge: 2018-05-11 | Disposition: A | Discharge status: Home / Self Care | Payer: Medicare Other | Source: Ambulatory Visit | Attending: Anesthesiology | Admitting: Anesthesiology

## 2018-05-11 ENCOUNTER — Encounter: Payer: Self-pay | Admitting: Urology

## 2018-05-11 ENCOUNTER — Ambulatory Visit (INDEPENDENT_AMBULATORY_CARE_PROVIDER_SITE_OTHER): Payer: BLUE CROSS/BLUE SHIELD | Admitting: Psychology

## 2018-05-11 ENCOUNTER — Encounter (HOSPITAL_COMMUNITY): Payer: Self-pay | Admitting: Physician Assistant

## 2018-05-11 ENCOUNTER — Ambulatory Visit: Payer: BLUE CROSS/BLUE SHIELD | Admitting: Family Medicine

## 2018-05-11 VITALS — BP 131/74 | HR 101

## 2018-05-11 DIAGNOSIS — Z79890 Hormone replacement therapy (postmenopausal): Secondary | ICD-10-CM | POA: Insufficient documentation

## 2018-05-11 DIAGNOSIS — I5032 Chronic diastolic (congestive) heart failure: Secondary | ICD-10-CM | POA: Diagnosis not present

## 2018-05-11 DIAGNOSIS — Z86718 Personal history of other venous thrombosis and embolism: Secondary | ICD-10-CM | POA: Diagnosis not present

## 2018-05-11 DIAGNOSIS — D6859 Other primary thrombophilia: Secondary | ICD-10-CM | POA: Diagnosis not present

## 2018-05-11 DIAGNOSIS — N135 Crossing vessel and stricture of ureter without hydronephrosis: Secondary | ICD-10-CM | POA: Diagnosis not present

## 2018-05-11 DIAGNOSIS — K219 Gastro-esophageal reflux disease without esophagitis: Secondary | ICD-10-CM | POA: Insufficient documentation

## 2018-05-11 DIAGNOSIS — F4312 Post-traumatic stress disorder, chronic: Secondary | ICD-10-CM

## 2018-05-11 DIAGNOSIS — R3915 Urgency of urination: Secondary | ICD-10-CM

## 2018-05-11 DIAGNOSIS — K760 Fatty (change of) liver, not elsewhere classified: Secondary | ICD-10-CM | POA: Insufficient documentation

## 2018-05-11 DIAGNOSIS — F419 Anxiety disorder, unspecified: Secondary | ICD-10-CM | POA: Insufficient documentation

## 2018-05-11 DIAGNOSIS — F4310 Post-traumatic stress disorder, unspecified: Secondary | ICD-10-CM | POA: Diagnosis not present

## 2018-05-11 DIAGNOSIS — I69351 Hemiplegia and hemiparesis following cerebral infarction affecting right dominant side: Secondary | ICD-10-CM | POA: Insufficient documentation

## 2018-05-11 DIAGNOSIS — N3941 Urge incontinence: Secondary | ICD-10-CM | POA: Diagnosis not present

## 2018-05-11 DIAGNOSIS — Z7901 Long term (current) use of anticoagulants: Secondary | ICD-10-CM | POA: Diagnosis not present

## 2018-05-11 DIAGNOSIS — G4733 Obstructive sleep apnea (adult) (pediatric): Secondary | ICD-10-CM | POA: Insufficient documentation

## 2018-05-11 DIAGNOSIS — Z01818 Encounter for other preprocedural examination: Secondary | ICD-10-CM | POA: Diagnosis present

## 2018-05-11 DIAGNOSIS — Z79899 Other long term (current) drug therapy: Secondary | ICD-10-CM | POA: Insufficient documentation

## 2018-05-11 DIAGNOSIS — M1711 Unilateral primary osteoarthritis, right knee: Secondary | ICD-10-CM | POA: Diagnosis not present

## 2018-05-11 HISTORY — DX: Other constipation: K59.09

## 2018-05-11 HISTORY — DX: Presence of external hearing-aid: Z97.4

## 2018-05-11 HISTORY — DX: Abnormal levels of other serum enzymes: R74.8

## 2018-05-11 HISTORY — DX: Splenomegaly, not elsewhere classified: R16.1

## 2018-05-11 HISTORY — DX: Unspecified convulsions: R56.9

## 2018-05-11 HISTORY — DX: Fatty (change of) liver, not elsewhere classified: K76.0

## 2018-05-11 HISTORY — DX: Unspecified hearing loss, unspecified ear: H91.90

## 2018-05-11 HISTORY — DX: Chronic diastolic (congestive) heart failure: I50.32

## 2018-05-11 HISTORY — DX: Personal history of other infectious and parasitic diseases: Z86.19

## 2018-05-11 HISTORY — DX: Presence of spectacles and contact lenses: Z97.3

## 2018-05-11 HISTORY — DX: Cardiomegaly: I51.7

## 2018-05-11 HISTORY — DX: Personal history of other diseases of the nervous system and sense organs: Z86.69

## 2018-05-11 HISTORY — DX: Gastro-esophageal reflux disease without esophagitis: K21.9

## 2018-05-11 HISTORY — DX: Nonrheumatic mitral (valve) prolapse: I34.1

## 2018-05-11 HISTORY — DX: Acute embolism and thrombosis of unspecified deep veins of unspecified lower extremity: I82.409

## 2018-05-11 HISTORY — DX: Other chronic pain: G89.29

## 2018-05-11 HISTORY — DX: Post-traumatic stress disorder, unspecified: F43.10

## 2018-05-11 HISTORY — DX: Dermatitis, unspecified: L30.9

## 2018-05-11 LAB — COMPREHENSIVE METABOLIC PANEL
ALT: 27 U/L (ref 0–44)
AST: 30 U/L (ref 15–41)
Albumin: 4.8 g/dL (ref 3.5–5.0)
Alkaline Phosphatase: 197 U/L — ABNORMAL HIGH (ref 38–126)
Anion gap: 8 (ref 5–15)
BUN: 19 mg/dL (ref 6–20)
CO2: 29 mmol/L (ref 22–32)
Calcium: 8.8 mg/dL — ABNORMAL LOW (ref 8.9–10.3)
Chloride: 101 mmol/L (ref 98–111)
Creatinine, Ser: 1.01 mg/dL (ref 0.61–1.24)
GFR calc non Af Amer: >60 mL/min (ref >60)
Glucose, Bld: 95 mg/dL (ref 70–99)
Potassium: 3.9 mmol/L (ref 3.5–5.1)
SODIUM: 138 mmol/L (ref 135–145)
Total Bilirubin: 0.7 mg/dL (ref 0.3–1.2)
Total Protein: 7.4 g/dL (ref 6.5–8.1)

## 2018-05-11 LAB — SURGICAL PCR SCREEN
MRSA, PCR: NEGATIVE
Staphylococcus aureus: NEGATIVE

## 2018-05-11 LAB — BLADDER SCAN AMB NON-IMAGING

## 2018-05-11 LAB — CBC
HCT: 45.1 % (ref 39.0–52.0)
Hemoglobin: 13.5 g/dL (ref 13.0–17.0)
MCH: 24.3 pg — AB (ref 26.0–34.0)
MCHC: 29.9 g/dL — ABNORMAL LOW (ref 30.0–36.0)
MCV: 81.1 fL (ref 80.0–100.0)
Platelets: 182 10*3/uL (ref 150–400)
RBC: 5.56 MIL/uL (ref 4.22–5.81)
RDW: 16 % — ABNORMAL HIGH (ref 11.5–15.5)
WBC: 3.4 10*3/uL — ABNORMAL LOW (ref 4.0–10.5)
nRBC: 0 % (ref 0.0–0.2)

## 2018-05-11 LAB — PROTIME-INR
INR: 1 (ref 0.8–1.2)
Prothrombin Time: 12.7 seconds (ref 11.4–15.2)

## 2018-05-11 LAB — APTT: aPTT: 28 seconds (ref 24–36)

## 2018-05-11 MED ORDER — POTASSIUM CHLORIDE ER 10 MEQ PO TBCR: EXTENDED_RELEASE_TABLET | ORAL | 11 refills | Status: DC

## 2018-05-11 NOTE — Pre-Procedure Instructions (Signed)
Surgical clearance Dr. Burr Medico (hematology) 03/22/2018 in hard chart received 05/11/2018.

## 2018-05-12 ENCOUNTER — Ambulatory Visit: Payer: Medicare Other | Admitting: Physical Therapy

## 2018-05-12 ENCOUNTER — Telehealth: Payer: Self-pay | Admitting: Physical Therapy

## 2018-05-12 NOTE — Telephone Encounter (Signed)
Physical therapist left message with pt re: missed appt today and inquired about response to last session's treatments and exercises. Requested pt to email any questions and to call front desk if need to cancel in the future.

## 2018-05-13 NOTE — Pre-Procedure Instructions (Addendum)
CBC and CMP results 05/11/2018 sent to Dr. Wynelle Link via epic.  Chart sent to [facility].A. for review medical history stroke and OSA with BiPap

## 2018-05-14 ENCOUNTER — Other Ambulatory Visit: Payer: Self-pay

## 2018-05-14 ENCOUNTER — Encounter: Payer: Self-pay | Admitting: Registered Nurse

## 2018-05-14 ENCOUNTER — Ambulatory Visit: Payer: Self-pay | Admitting: Registered Nurse

## 2018-05-14 ENCOUNTER — Encounter: Payer: Medicare Other | Attending: Physical Medicine & Rehabilitation | Admitting: Registered Nurse

## 2018-05-14 VITALS — BP 135/89 | HR 111

## 2018-05-14 DIAGNOSIS — I69351 Hemiplegia and hemiparesis following cerebral infarction affecting right dominant side: Secondary | ICD-10-CM | POA: Diagnosis not present

## 2018-05-14 DIAGNOSIS — M7061 Trochanteric bursitis, right hip: Secondary | ICD-10-CM

## 2018-05-14 DIAGNOSIS — F418 Other specified anxiety disorders: Secondary | ICD-10-CM | POA: Diagnosis not present

## 2018-05-14 DIAGNOSIS — Z79891 Long term (current) use of opiate analgesic: Secondary | ICD-10-CM | POA: Diagnosis not present

## 2018-05-14 DIAGNOSIS — D751 Secondary polycythemia: Secondary | ICD-10-CM | POA: Diagnosis not present

## 2018-05-14 DIAGNOSIS — M7522 Bicipital tendinitis, left shoulder: Secondary | ICD-10-CM | POA: Insufficient documentation

## 2018-05-14 DIAGNOSIS — M170 Bilateral primary osteoarthritis of knee: Secondary | ICD-10-CM

## 2018-05-14 DIAGNOSIS — S43002A Unspecified subluxation of left shoulder joint, initial encounter: Secondary | ICD-10-CM | POA: Insufficient documentation

## 2018-05-14 DIAGNOSIS — L4050 Arthropathic psoriasis, unspecified: Secondary | ICD-10-CM

## 2018-05-14 DIAGNOSIS — Z87890 Personal history of sex reassignment: Secondary | ICD-10-CM | POA: Insufficient documentation

## 2018-05-14 DIAGNOSIS — I69398 Other sequelae of cerebral infarction: Secondary | ICD-10-CM | POA: Diagnosis not present

## 2018-05-14 DIAGNOSIS — G894 Chronic pain syndrome: Secondary | ICD-10-CM

## 2018-05-14 DIAGNOSIS — Z9181 History of falling: Secondary | ICD-10-CM | POA: Diagnosis not present

## 2018-05-14 DIAGNOSIS — Z7901 Long term (current) use of anticoagulants: Secondary | ICD-10-CM | POA: Diagnosis not present

## 2018-05-14 DIAGNOSIS — M545 Low back pain: Secondary | ICD-10-CM | POA: Insufficient documentation

## 2018-05-14 DIAGNOSIS — Z86718 Personal history of other venous thrombosis and embolism: Secondary | ICD-10-CM | POA: Insufficient documentation

## 2018-05-14 DIAGNOSIS — Z79899 Other long term (current) drug therapy: Secondary | ICD-10-CM | POA: Diagnosis not present

## 2018-05-14 DIAGNOSIS — Z5181 Encounter for therapeutic drug level monitoring: Secondary | ICD-10-CM

## 2018-05-14 DIAGNOSIS — K900 Celiac disease: Secondary | ICD-10-CM | POA: Diagnosis not present

## 2018-05-14 DIAGNOSIS — I69320 Aphasia following cerebral infarction: Secondary | ICD-10-CM | POA: Insufficient documentation

## 2018-05-14 DIAGNOSIS — G8929 Other chronic pain: Secondary | ICD-10-CM | POA: Diagnosis present

## 2018-05-14 DIAGNOSIS — I6782 Cerebral ischemia: Secondary | ICD-10-CM

## 2018-05-14 DIAGNOSIS — M13862 Other specified arthritis, left knee: Secondary | ICD-10-CM | POA: Insufficient documentation

## 2018-05-14 DIAGNOSIS — D6859 Other primary thrombophilia: Secondary | ICD-10-CM | POA: Diagnosis not present

## 2018-05-14 DIAGNOSIS — M5416 Radiculopathy, lumbar region: Secondary | ICD-10-CM | POA: Diagnosis not present

## 2018-05-14 DIAGNOSIS — M7062 Trochanteric bursitis, left hip: Secondary | ICD-10-CM

## 2018-05-14 DIAGNOSIS — Z96652 Presence of left artificial knee joint: Secondary | ICD-10-CM

## 2018-05-14 DIAGNOSIS — R209 Unspecified disturbances of skin sensation: Secondary | ICD-10-CM | POA: Diagnosis not present

## 2018-05-14 DIAGNOSIS — G8101 Flaccid hemiplegia affecting right dominant side: Secondary | ICD-10-CM

## 2018-05-14 MED ORDER — OXYCODONE HCL 10 MG PO TABS: 10.0000 mg | ORAL_TABLET | Freq: Two times a day (BID) | ORAL | 0 refills | Status: DC | PRN

## 2018-05-14 MED ORDER — MORPHINE SULFATE ER 60 MG PO CP24: 60.0000 mg | ORAL_CAPSULE | Freq: Two times a day (BID) | ORAL | 0 refills | Status: DC

## 2018-05-14 NOTE — Progress Notes (Signed)
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 FGaynelle Arabian   Pt last seen by cardiology 05/07/18. Seen by HAlmyra Deforest PA-C.  Per OV note, "Overall he is quite stable from cardiology perspective to proceed with surgery without further work-up."   Pt is followed by hematologist, Dr. YTruitt Merle  Last seen 03/11/18.  Per clearance received from Dr. FBurr Medicoon 03/22/18 pt is to hold Xarelto 2-3 days prior to surgery (on chart).   Pt can proceed with planned procedure barring acute status change.  VS: BP 129/90   Pulse 90   Temp 36.6 C (Oral)   Resp 16   Ht 5' 8"  (1.727 m)   Wt 100.7 kg   SpO2 96%   BMI 33.75 kg/m   PROVIDERS: YConcepcion Elk MD  is PCP   CSanda Klein MD is cardiologist   FTruitt Merle MD is hematologist  LABS: Labs reviewed: Acceptable for surgery. (all labs ordered are listed, but only abnormal results are displayed)  Labs Reviewed  CBC - Abnormal; Notable for the following components:      Result Value   WBC 3.4 (*)    MCH 24.3 (*)    MCHC 29.9 (*)    RDW 16.0 (*)    All other components within normal limits  COMPREHENSIVE METABOLIC PANEL - Abnormal; Notable for the following components:   Calcium 8.8 (*)    Alkaline Phosphatase 197 (*)    All other components within normal limits  SURGICAL PCR SCREEN  APTT  PROTIME-INR  TYPE AND SCREEN     IMAGES: Chest Xray 05/11/18 FINDINGS: The heart size and mediastinal contours are within  normal limits. Both lungs are clear. The visualized skeletal structures are unremarkable.  IMPRESSION: Normal exam.  EKG: 05/07/2018 Rate 83 bpm Normal sinus rhythm  Rightward axis Abnormal QRS-T angle, consider primary T wave abnormality  CV: Echo 12/15/2016 Study Conclusions  - Left ventricle: The cavity size was normal. Wall thickness was   increased in a pattern of mild LVH. Systolic function was normal.   The estimated ejection fraction was in the range of 60% to 65%.   Wall motion was normal; there were no regional wall motion   abnormalities. Left ventricular diastolic function parameters   were normal. - Mitral valve: There was mild regurgitation. - Left atrium: The atrium was mildly dilated. - Atrial septum: No defect or patent foramen ovale was identified. Past Medical History:  Diagnosis Date  . Abnormal weight loss   . Anxiety   . Arthritis   . Celiac disease   . Cervical neck pain with evidence of disc disease    patient has a cyst   . Chronic constipation   . Chronic diastolic heart failure ([place]    Pt. denies  . Chronic pain   . Degenerative disc disease at L5-S1 level    with stenosis  . DVT (deep venous thrombosis) ([facility]  Right upper arm, bilateral leg  . Eczema    inguinal, feet  . Elevated liver enzymes   . 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 Family history of adverse reaction to anesthesia    family has problems with anesthesia of nausea and vomiting   . 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 GERD (gastroesophageal reflux disease)    History of in 20's  . Gluten enteropathy   . H/O parotitis    right   . Hard of hearing   . History of kidney stones   . History of retinal tear    Bilateral  . History of staph infection    required wound vac  . Hx-TIA (transient ischemic attack)    2015  . LVH (left ventricular hypertrophy) 12/15/2016   Mild, noted on ECHO  . MVP (mitral valve prolapse)   . NAFL (nonalcoholic fatty  liver)   . Neck pain   . Neuromuscular disorder ([place])    bilateral neuropathy feet.  . Pneumonia 12/17/2010  . Polycythemia   . Polycythemia, secondary   . PONV (postoperative nausea and vomiting)   . Protein C deficiency ([place])    Dr. Anne Fu  . Psoriasis   . psoriatic arthritis   . PTSD (post-traumatic stress disorder)   . Scaphoid fracture of wrist 09/23/2013  . Seizure ([place])    childhood, medication until age 51 then weanned completely off  . Sleep apnea    split night study last done by Dr. [name] 06/18/15 shows severe OSA, CSA, and hypersomnia, rec bipap  . Splenomegaly   . Stenosis of ureteropelvic junction (UPJ)    left  . Stroke [facility])    CVA vs TIA in left cerebrum causing slight right sided weakness-Dr. [name] follows  . 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 Tachycardia    hx of   . Transfusion history    past history- none recent, after surgeries due to blood loss  . Wears glasses   . Wears hearing aid     Past Surgical History:  Procedure Laterality Date  . ABDOMINAL HYSTERECTOMY Bilateral 1994   TAH, BSO- tranverse incision at 53 yo  . ANKLE ARTHROSCOPY WITH RECONSTRUCTION Right 2007  . CHOLECYSTECTOMY     laparoscopic  . COLONOSCOPY     x3  . EYE SURGERY     Left eye 03/02/2018, right 02/15/2018  . HIP ARTHROSCOPY W/ LABRAL REPAIR Right 05/11/2013   acetabular labral tear 03/30/2013  . KNEE ARTHROPLASTY Right   . KNEE JOINT MANIPULATION Left    x3 under anesthesia  . KNEE SURGERY Bilateral 1984   Right ACL, left PCL repair  . LITHOTRIPSY  2005  . LIVER BIOPSY  2013   normal results.  [name] MASTECTOMY Bilateral    prior to 2009  . MOUTH SURGERY    . NASAL SEPTUM SURGERY N/A 09/20/2015   by ENT Dr. [name]  . OVARIAN CYST SURGERY Left    size of grapefruit, was informed that she had shortened vagina  . SHOULDER SURGERY Bilateral    Right 08/15/2016, Left 11/15/2016  . THUMB ARTHROSCOPY Left   . THYROIDECTOMY, PARTIAL Left 2008   . TOTAL KNEE REVISION Left 02/06/2016   Procedure: LEFT TOTAL KNEE REVISION;  Surgeon: [name], MD;  [facility];  Service: Orthopedics;  Laterality: Left;  . TOTAL KNEE REVISION Left 04/22/2017   Procedure: Left knee polyethylene revision;  Surgeon: [name], MD;  Location: [facility];  Service: Orthopedics;  Laterality: Left;  . UPPER GI ENDOSCOPY  2003    MEDICATIONS: . furosemide (LASIX) 20 MG tablet  . furosemide (LASIX) 40 MG tablet  . acyclovir ointment (ZOVIRAX) 5 %  . baclofen (LIORESAL) 20 MG tablet  . Calcipotriene-Betameth Diprop (ENSTILAR) 0.005-0.064 % FOAM  . clorazepate (TRANXENE) 15 MG tablet  . clorazepate (TRANXENE) 7.5 MG tablet  . desonide (DESOWEN) 0.05 % ointment  . diclofenac sodium (VOLTAREN) 1 % GEL  . Melatonin-Theanine (MELATONIN PLUS L-THEANINE PO)  . mirabegron ER (MYRBETRIQ) 25 MG TB24 tablet  . morphine (KADIAN) 60 MG 24 hr capsule  . NEEDLE, DISP, 18 G 18G X 1-1/2" MISC  . NEEDLE, DISP, 23 G 23G X 1" MISC  . NEEDLE, DISP, 23 G 23G X 3/4" MISC  . neomycin-polymyxin-hydrocortisone (CORTISPORIN) 3.5-10000-1 OTIC suspension  . NONFORMULARY OR COMPOUNDED ITEM  . Oxycodone HCl 10 MG TABS  . potassium chloride (K-DUR) 10 MEQ tablet  . potassium chloride (K-DUR) 10 MEQ tablet  . prazosin (MINIPRESS) 2 MG capsule  . Propylene Glycol (SYSTANE COMPLETE) 0.6 % SOLN  . rivaroxaban (XARELTO) 20 MG TABS tablet  . Secukinumab (COSENTYX SENSOREADY PEN) 150 MG/ML SOAJ  . Secukinumab (COSENTYX) 150 MG/ML SOSY  . Syringe, Disposable, 1 ML MISC  . SYRINGE-NEEDLE, DISP, 3 ML (B-D 3CC LUER-LOK SYR 18GX1-1/2) 18G X 1-1/2" 3 ML MISC  . testosterone cypionate (DEPOTESTOSTERONE CYPIONATE) 200 MG/ML injection  . Testosterone Cypionate 200 MG/ML SOLN  . ursodiol (ACTIGALL) 250 MG tablet  . ursodiol (ACTIGALL) 500 MG tablet  . venlafaxine XR (EFFEXOR-XR) 150 MG 24 hr capsule   No current facility-administered medications for this encounter.     Maia Plan [facility] Pre-Surgical Testing [phone] 05/14/18 12:48 PM

## 2018-05-14 NOTE — Progress Notes (Signed)
Called Rodderick Holtzer and informed him of time change for surgery on 05/17/2018. He is to arrive at 0800 for 1030 surgery. NPO after midnight. He verbalizes understanding.

## 2018-05-14 NOTE — Anesthesia Preprocedure Evaluation (Deleted)
Anesthesia Evaluation Anesthesia Physical Anesthesia Plan  ASA:   Anesthesia Plan:    Post-op Pain Management:    Induction:   PONV Risk Score and Plan:   Airway Management Planned:   Additional Equipment:   Intra-op Plan:   Post-operative Plan:   Informed Consent:   Plan Discussed with:   Anesthesia Plan Comments: (See PAT note 05/11/18, Konrad Felix, PA-C)        Anesthesia Quick Evaluation

## 2018-05-14 NOTE — Telephone Encounter (Signed)
Opened in error

## 2018-05-14 NOTE — Progress Notes (Signed)
Subjective:    Patient ID: Jesse Bowers, adult    DOB: January 27, 1966, 53 y.o.   MRN: [id number]  HPI: [name] is a 53 y.o. male who returns for follow up appointment for chronic pain and medication refill. He states his pain is located in his right thumb, lower back radiating into his buttocks and left lower extremity, also reports bilateral hip pain and bilateral knee pain. He rates his pain 8. His current exercise regime is walking in his home short distances with walker and performing stretching exercises.  Mr. Faulcon scheduled for Right TKA with Dr. Wynelle Link on 05/17/2018.  Mr. Beale Morphine equivalent is 150.00  MME.  Last UDS was Performed on 08/17/2017, it was consistent. UDS ordered today.    Pain Inventory Average Pain 8 Pain Right Now 8 My pain is sharp, dull, stabbing, tingling and aching  In the last 24 hours, has pain interfered with the following? General activity 7 Relation with others 7 Enjoyment of life 7 What TIME of day is your pain at its worst? daytime and night Sleep (in general) Poor  Pain is worse with: walking, sitting and standing Pain improves with: heat/ice, therapy/exercise, medication and injections Relief from Meds: 6  Mobility walk with assistance use a walker how many minutes can you walk? 10 with assistance ability to climb steps?  yes do you drive?  yes  Function disabled: date disabled 05/14/12 I need assistance with the following:  dressing, bathing, meal prep, household duties and shopping  Neuro/Psych bladder control problems weakness numbness tremor trouble walking spasms confusion anxiety  Prior Studies Any changes since last visit?  no  Physicians involved in your care Any changes since last visit?  no   Family History  Problem Relation Age of Onset  . Stroke Maternal Grandfather        31  . Heart attack Maternal Grandfather   . Hypertension Mother   . Psoriasis Mother   . Other Mother        meningioma  developed ~2019  . Cancer Paternal Grandfather   . Heart attack Paternal Grandfather   . Stroke Paternal Uncle        age 54  . Polycythemia Paternal Uncle   . Stroke Maternal Grandmother   . Congestive Heart Failure Maternal Grandmother   . Heart attack Maternal Grandmother   . Protein C deficiency Sister 34       Miscarriages   Social History   Socioeconomic History  . Marital status: Married    Spouse name: Not on file  . Number of children: 2  . Years of education: 4y college  . Highest education level: Not on file  Occupational History  . Occupation: Pediatric Nurse practitioner    Comment: Not working since [employer] 2015  Social Needs  . Financial resource strain: Not on file  . Food insecurity:    Worry: Not on file    Inability: Not on file  . Transportation needs:    Medical: Not on file    Non-medical: Not on file  Tobacco Use  . Smoking status: Never Smoker  . Smokeless tobacco: Never Used  Substance and Sexual Activity  . Alcohol use: Yes    Frequency: Never    Comment: social  . Drug use: No  . Sexual activity: Yes    Birth control/protection: None    Comment: patient is a transgender on testosterone shots, no biological kids  Lifestyle  . Physical activity:    Days per  week: Not on file    Minutes per session: Not on file  . Stress: Not on file  Relationships  . Social connections:    Talks on phone: Not on file    Gets together: Not on file    Attends religious service: Not on file    Active member of club or organization: Not on file    Attends meetings of clubs or organizations: Not on file    Relationship status: Not on file  Other Topics Concern  . Not on file  Social History Narrative   Education 4 year college, former Therapist, sports X 15 years, pediatric nurse practitioner x 6 years, did NP degree from [employer] [place]. Relocated to [place] about 2 months ago from [place], [place]. Patient was in [place] for last 4 years and prior to that in [place].  His wife is working as Scientist, research (physical sciences) for [employer]. Patient is not working and applying for disability. They have 2 kids but no biologic children.    Past Surgical History:  Procedure Laterality Date  . ABDOMINAL HYSTERECTOMY Bilateral 1994   TAH, BSO- tranverse incision at 53 yo  . ANKLE ARTHROSCOPY WITH RECONSTRUCTION Right 2007  . CHOLECYSTECTOMY     laparoscopic  . COLONOSCOPY     x3  . EYE SURGERY     Left eye 03/02/2018, right 02/15/2018  . HIP ARTHROSCOPY W/ LABRAL REPAIR Right 05/11/2013   acetabular labral tear 03/30/2013  . KNEE ARTHROPLASTY Right   . KNEE JOINT MANIPULATION Left    x3 under anesthesia  . KNEE SURGERY Bilateral 1984   Right ACL, left PCL repair  . LITHOTRIPSY  2005  . LIVER BIOPSY  2013   normal results.  [name] MASTECTOMY Bilateral    prior to 2009  . MOUTH SURGERY    . NASAL SEPTUM SURGERY N/A 09/20/2015   by ENT Dr. [name]  . OVARIAN CYST SURGERY Left    size of grapefruit, was informed that she had shortened vagina  . SHOULDER SURGERY Bilateral    Right 08/15/2016, Left 11/15/2016  . THUMB ARTHROSCOPY Left   . THYROIDECTOMY, PARTIAL Left 2008  . TOTAL KNEE REVISION Left 02/06/2016   Procedure: LEFT TOTAL KNEE REVISION;  Surgeon: [name], MD;  [facility];  Service: Orthopedics;  Laterality: Left;  . TOTAL KNEE REVISION Left 04/22/2017   Procedure: Left knee polyethylene revision;  Surgeon: [name], MD;  [facility];  Service: Orthopedics;  Laterality: Left;  . UPPER GI ENDOSCOPY  2003   Past Medical History:  Diagnosis Date  . Abnormal weight loss   . Anxiety   . Arthritis   . Celiac disease   . Cervical neck pain with evidence of disc disease    patient has a cyst   . Chronic constipation   . Chronic diastolic heart failure ([place])    Pt. denies  . Chronic pain   . Degenerative disc disease at L5-S1 level    with stenosis  . DVT (deep venous thrombosis) ([facility])    Right upper arm, bilateral leg  . Eczema    inguinal,  feet  . Elevated liver enzymes   . 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 Family history of adverse reaction to anesthesia    family has problems with anesthesia of nausea and vomiting   . 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 GERD (gastroesophageal reflux disease)    History of in 20's  . Gluten enteropathy   . H/O parotitis  right   . Hard of hearing   . History of kidney stones   . History of retinal tear    Bilateral  . History of staph infection    required wound vac  . Hx-TIA (transient ischemic attack)    2015  . LVH (left ventricular hypertrophy) 12/15/2016   Mild, noted on ECHO  . MVP (mitral valve prolapse)   . NAFL (nonalcoholic fatty liver)   . Neck pain   . Neuromuscular disorder ([place])    bilateral neuropathy feet.  . Pneumonia 12/17/2010  . Polycythemia   . Polycythemia, secondary   . PONV (postoperative nausea and vomiting)   . Protein C deficiency ([place])    Dr. Anne Fu  . Psoriasis   . psoriatic arthritis   . PTSD (post-traumatic stress disorder)   . Scaphoid fracture of wrist 09/23/2013  . Seizure ([place])    childhood, medication until age 38 then weanned completely off  . Sleep apnea    split night study last done by Dr. [name] 06/18/15 shows severe OSA, CSA, and hypersomnia, rec bipap  . Splenomegaly   . Stenosis of ureteropelvic junction (UPJ)    left  . Stroke [facility])    CVA vs TIA in left cerebrum causing slight right sided weakness-Dr. [name] follows  . Syrinx of spinal cord ([place]) 01/06/2014   c spine on MRI  . Tachycardia    hx of   . Transfusion history    past history- none recent, after surgeries due to blood loss  . Wears glasses   . Wears hearing aid    BP 135/89   Pulse (!) 111   SpO2 95%   Opioid Risk Score:   Fall Risk Score:  `1  Depression screen PHQ 2/9  Depression screen [facility] 2/9 02/02/2018 09/23/2017 08/07/2017 06/24/2017 02/18/2017  Decreased Interest 0 0 3 0 1  Down, Depressed, Hopeless 0 0 3 0 1  PHQ - 2  Score 0 0 6 0 2  Altered sleeping - - - 0 -  Tired, decreased energy - - - 0 -  Change in appetite - - - 0 -  Feeling bad or failure about yourself  - - - 0 -  Trouble concentrating - - - 0 -  Moving slowly or fidgety/restless - - - 0 -  Suicidal thoughts - - - 0 -  PHQ-9 Score - - - 0 -  Some recent data might be hidden   Review of Systems  Constitutional: Negative.   HENT: Negative.   Eyes: Negative.   Respiratory: Negative.   Cardiovascular: Negative.   Gastrointestinal: Negative.   Endocrine: Negative.   Genitourinary:       Bladder control  Musculoskeletal: Positive for gait problem.       Spasms  Skin: Negative.   Allergic/Immunologic: Negative.   Neurological: Positive for tremors, weakness and numbness.  Hematological: Negative.   Psychiatric/Behavioral: Positive for confusion. The patient is nervous/anxious.   All other systems reviewed and are negative.      Objective:   Physical Exam Vitals signs and nursing note reviewed.  Constitutional:      Appearance: Normal appearance.  Neck:     Musculoskeletal: Normal range of motion and neck supple.  Cardiovascular:     Rate and Rhythm: Normal rate and regular rhythm.     Pulses: Normal pulses.     Heart sounds: Normal heart sounds.  Pulmonary:     Effort: Pulmonary effort is normal.     Breath  sounds: Normal breath sounds.  Musculoskeletal:     Comments: Normal Muscle Bulk and Muscle Testing Reveals:  Upper Extremities: Full ROM and Muscle Strength 5/5 Lumbar Paraspinal Tenderness: L-3-L-5 Bilateral Greater Trochanter Tenderness Lower Extremities: Full ROM and Muscle Strength 5/5 Arrived in wheelchair  Skin:    General: Skin is warm and dry.  Neurological:     Mental Status: He is alert and oriented to person, place, and time.  Psychiatric:        Mood and Affect: Mood normal.        Behavior: Behavior normal.           Assessment & Plan:  1. Psoriatic arthritis with pain in multiple areas, most  prominently feet, hands, elbows. Refilled:Kadian 60 mg 24 hr. Capsule, one capsule every 12 hours #60, second script sent for the following month and Oxycodone 10 mg one tablet every 12 hours as needed #60. He only received one prescription for Oxycodone, schedule for TKA, on 05/17/2018. He was instructed to send My Chart Message at the end of month for F?U on Oxycodone, he verbalizes understanding. 05/14/2018 We will continue the opioid monitoring program, this consists of regular clinic visits, examinations, urine drug screen, pill counts as well as use of [place] Controlled Substance Reporting System.  Rheumatology Following. 2. Chronic Pain Syndrome: Continue Compound Cream.05/14/2018 3. Prior left sided CVA ('s) most substantial of which in May 2015 with residual right sided weakness, sensory loss, and expressive language deficits.: Continue to Monitor.05/14/2018 4. Bilateral OA to both knees/ Patello-femoral arthritis left knee: Continue to monitor.05/14/2018 S/P TKR on 07/03/14: Ortho Following: Dr. Wynelle Link perform Total Left Knee Revision on 02/06/2016. S/P Left Knee Revision on 04/22/17. He's scheduled for Right TKA on 05/17/2018 with Dr. Wynelle Link. 5. Chronic mid- low back pain: Continue current medication regime, and encourage to increase activity as tolerated.05/14/2018 6. Polycythemia: Oncology Following.05/14/2018 7. Depression with anxiety : Continue to Monitor.05/14/2018 8. Cervicalgia: No complaints today. Continue HEP as tolerated and Continue to Monitor. Continue current medication regime. 05/14/2018 9. Muscle Spasticity: Continue Baclofen03/13/2020 10.Bilateral Ankle Pain: No complaints today. Continue to Monitor. 05/14/2018 10. Left lateral epicondylitis:No complaints today.Ortho Following.05/14/2018 11. Left Shoulder Pain:No complaints today.Ortho Following.05/14/2018 12. Lumbar Radiculitis: S/P ESI with Dr. Letta Pate. 08/07/2017, good relief  noted.05/14/2018 13.BilateralGreater Trochanteric Bursitis: Continue with Ice and Heat Therapy: Contiue HEP as Tolerated. Continue to Monitor. 05/14/2018.  20 minutes of face to face patient care time was spent during this visit. All questions were encouraged and answered.  F/U in 2 months

## 2018-05-16 MED ORDER — BUPIVACAINE LIPOSOME 1.3 % IJ SUSP
20.0000 mL | Freq: Once | INTRAMUSCULAR | Status: DC
  Filled 2018-05-16: qty 20

## 2018-05-16 MED ORDER — TRANEXAMIC ACID 1000 MG/10ML IV SOLN
2000.0000 mg | Freq: Once | INTRAVENOUS | Status: DC
  Filled 2018-05-16: qty 20

## 2018-05-17 ENCOUNTER — Inpatient Hospital Stay (HOSPITAL_COMMUNITY): Admission: RE | Admit: 2018-05-17 | Payer: Medicare Other | Source: Home / Self Care | Admitting: Orthopedic Surgery

## 2018-05-17 ENCOUNTER — Encounter (HOSPITAL_COMMUNITY): Admission: RE | Payer: Self-pay | Source: Home / Self Care

## 2018-05-17 LAB — TYPE AND SCREEN
ABO/RH(D): A POS
Antibody Screen: NEGATIVE

## 2018-05-17 SURGERY — ARTHROPLASTY, KNEE, TOTAL
Anesthesia: Choice | Laterality: Right

## 2018-05-18 ENCOUNTER — Ambulatory Visit: Payer: Medicare Other | Admitting: Psychology

## 2018-05-18 ENCOUNTER — Telehealth: Payer: Self-pay | Admitting: Registered Nurse

## 2018-05-18 ENCOUNTER — Ambulatory Visit (INDEPENDENT_AMBULATORY_CARE_PROVIDER_SITE_OTHER): Payer: BLUE CROSS/BLUE SHIELD | Admitting: Psychology

## 2018-05-18 DIAGNOSIS — F4312 Post-traumatic stress disorder, chronic: Secondary | ICD-10-CM

## 2018-05-18 DIAGNOSIS — M5416 Radiculopathy, lumbar region: Secondary | ICD-10-CM

## 2018-05-18 MED ORDER — OXYCODONE HCL 10 MG PO TABS: 10.0000 mg | ORAL_TABLET | Freq: Two times a day (BID) | ORAL | 0 refills | Status: DC | PRN

## 2018-05-18 NOTE — Telephone Encounter (Signed)
Jesse Bowers sent a My Chart message, his Knee surgery was cancelled. Oxycodone prescription for April was e-scribed. I sent him a response via My Chart.

## 2018-05-19 ENCOUNTER — Ambulatory Visit: Payer: Medicare Other | Admitting: Physical Therapy

## 2018-05-20 LAB — TOXASSURE SELECT,+ANTIDEPR,UR

## 2018-05-20 LAB — 6-ACETYLMORPHINE,TOXASSURE ADD
6-ACETYLMORPHINE: NEGATIVE
6-ACETYLMORPHINE: NOT DETECTED ng/mg{creat}

## 2018-05-24 ENCOUNTER — Telehealth: Payer: Self-pay | Admitting: *Deleted

## 2018-05-24 NOTE — Telephone Encounter (Signed)
Urine drug screen for this encounter is consistent for prescribed medication 

## 2018-05-25 ENCOUNTER — Ambulatory Visit: Payer: BLUE CROSS/BLUE SHIELD | Admitting: Psychology

## 2018-05-26 ENCOUNTER — Ambulatory Visit: Payer: Medicare Other | Admitting: Physical Therapy

## 2018-05-26 ENCOUNTER — Other Ambulatory Visit: Payer: Self-pay | Admitting: *Deleted

## 2018-05-26 MED ORDER — NONFORMULARY OR COMPOUNDED ITEM: 1.0000 "application " | Freq: Three times a day (TID) | 5 refills | Status: DC | PRN

## 2018-06-01 ENCOUNTER — Ambulatory Visit (INDEPENDENT_AMBULATORY_CARE_PROVIDER_SITE_OTHER): Payer: Medicare Other | Admitting: Psychology

## 2018-06-01 ENCOUNTER — Telehealth: Payer: Self-pay | Admitting: *Deleted

## 2018-06-01 ENCOUNTER — Ambulatory Visit: Payer: Medicare Other | Admitting: Psychology

## 2018-06-01 DIAGNOSIS — M5416 Radiculopathy, lumbar region: Secondary | ICD-10-CM

## 2018-06-01 DIAGNOSIS — L4050 Arthropathic psoriasis, unspecified: Secondary | ICD-10-CM

## 2018-06-01 DIAGNOSIS — F4312 Post-traumatic stress disorder, chronic: Secondary | ICD-10-CM

## 2018-06-01 MED ORDER — MORPHINE SULFATE ER 60 MG PO CP24: 60.0000 mg | ORAL_CAPSULE | Freq: Two times a day (BID) | ORAL | 0 refills | Status: DC

## 2018-06-01 NOTE — Telephone Encounter (Signed)
[facility] left message stating that [facility] [address] [facility] complete order of Morphine 60 mg ER (Kadian).  P atient called around and discovered it is available at [facility] [place].  He is asking for Danella Sensing, ANP to please resubmit script as soon as possible.  I have cancelled script [facility] [address] [facility] and added [facility] [place] [facility] to patients list of pharmacy

## 2018-06-01 NOTE — Telephone Encounter (Signed)
PMP was Reviewed and Morphine prescription was e-scribed.

## 2018-06-08 ENCOUNTER — Ambulatory Visit (INDEPENDENT_AMBULATORY_CARE_PROVIDER_SITE_OTHER): Payer: BLUE CROSS/BLUE SHIELD | Admitting: Psychology

## 2018-06-08 DIAGNOSIS — F4312 Post-traumatic stress disorder, chronic: Secondary | ICD-10-CM | POA: Diagnosis not present

## 2018-06-09 ENCOUNTER — Other Ambulatory Visit: Payer: Self-pay

## 2018-06-09 ENCOUNTER — Telehealth: Payer: Self-pay | Admitting: Registered Nurse

## 2018-06-09 DIAGNOSIS — F640 Transsexualism: Secondary | ICD-10-CM

## 2018-06-09 DIAGNOSIS — Z789 Other specified health status: Secondary | ICD-10-CM

## 2018-06-09 DIAGNOSIS — M5416 Radiculopathy, lumbar region: Secondary | ICD-10-CM

## 2018-06-09 DIAGNOSIS — D500 Iron deficiency anemia secondary to blood loss (chronic): Secondary | ICD-10-CM

## 2018-06-09 MED ORDER — OXYCODONE HCL 10 MG PO TABS: 10.0000 mg | ORAL_TABLET | Freq: Three times a day (TID) | ORAL | 0 refills | Status: DC | PRN

## 2018-06-09 NOTE — Telephone Encounter (Signed)
PMP was Reviewed: Oxycodone e-scribed. Responded to Jesse Bowers via [employer]. His Knee surgery was cancelled, we will increase his Oxycodone this month, he is aware of the above.

## 2018-06-10 ENCOUNTER — Inpatient Hospital Stay: Payer: Medicare Other | Attending: Hematology

## 2018-06-10 ENCOUNTER — Other Ambulatory Visit: Payer: Self-pay

## 2018-06-10 ENCOUNTER — Inpatient Hospital Stay: Payer: Medicare Other

## 2018-06-10 DIAGNOSIS — D751 Secondary polycythemia: Secondary | ICD-10-CM

## 2018-06-10 DIAGNOSIS — F640 Transsexualism: Secondary | ICD-10-CM

## 2018-06-10 DIAGNOSIS — D500 Iron deficiency anemia secondary to blood loss (chronic): Secondary | ICD-10-CM

## 2018-06-10 DIAGNOSIS — Z789 Other specified health status: Secondary | ICD-10-CM

## 2018-06-10 LAB — CBC WITH DIFFERENTIAL/PLATELET
Abs Immature Granulocytes: 0 10*3/uL (ref 0.00–0.07)
Basophils Absolute: 0 10*3/uL (ref 0.0–0.1)
Basophils Relative: 0 %
Eosinophils Absolute: 0 10*3/uL (ref 0.0–0.5)
Eosinophils Relative: 0 %
HCT: 44.6 % (ref 39.0–52.0)
Hemoglobin: 13.2 g/dL (ref 13.0–17.0)
Immature Granulocytes: 0 %
Lymphocytes Relative: 27 %
Lymphs Abs: 0.9 10*3/uL (ref 0.7–4.0)
MCH: 23.5 pg — ABNORMAL LOW (ref 26.0–34.0)
MCHC: 29.6 g/dL — ABNORMAL LOW (ref 30.0–36.0)
MCV: 79.5 fL — ABNORMAL LOW (ref 80.0–100.0)
Monocytes Absolute: 0.3 10*3/uL (ref 0.1–1.0)
Monocytes Relative: 8 %
Neutro Abs: 2.2 10*3/uL (ref 1.7–7.7)
Neutrophils Relative %: 65 %
Platelets: 178 10*3/uL (ref 150–400)
RBC: 5.61 MIL/uL (ref 4.22–5.81)
RDW: 16.2 % — ABNORMAL HIGH (ref 11.5–15.5)
WBC: 3.4 10*3/uL — ABNORMAL LOW (ref 4.0–10.5)
nRBC: 0 % (ref 0.0–0.2)

## 2018-06-10 LAB — COMPREHENSIVE METABOLIC PANEL
ALT: 20 U/L (ref 0–44)
AST: 21 U/L (ref 15–41)
Albumin: 4 g/dL (ref 3.5–5.0)
Alkaline Phosphatase: 191 U/L — ABNORMAL HIGH (ref 38–126)
Anion gap: 10 (ref 5–15)
BUN: 16 mg/dL (ref 6–20)
CO2: 23 mmol/L (ref 22–32)
Calcium: 8.8 mg/dL — ABNORMAL LOW (ref 8.9–10.3)
Chloride: 104 mmol/L (ref 98–111)
Creatinine, Ser: 1.17 mg/dL (ref 0.61–1.24)
GFR calc Af Amer: >60 mL/min (ref >60)
GFR calc non Af Amer: >60 mL/min (ref >60)
Glucose, Bld: 139 mg/dL — ABNORMAL HIGH (ref 70–99)
Potassium: 4.1 mmol/L (ref 3.5–5.1)
Sodium: 137 mmol/L (ref 135–145)
Total Bilirubin: 0.4 mg/dL (ref 0.3–1.2)
Total Protein: 6.6 g/dL (ref 6.5–8.1)

## 2018-06-10 NOTE — Progress Notes (Unsigned)
HCT 44.6 no phlebotomy needed based on parameters. Labs results reviewed w/ pt. Pt reports fatiqge, but no other complaints at this time. Dr. Burr Medico notified and she offered to see patient today. Pt denies need to speak w/ MD today.

## 2018-06-11 LAB — ESTRADIOL: Estradiol: 41.8 pg/mL (ref 7.6–42.6)

## 2018-06-11 LAB — TESTOSTERONE, FREE, TOTAL, SHBG
Sex Hormone Binding: 34.9 nmol/L (ref 19.3–76.4)
Testosterone, Free: 13.5 pg/mL (ref 7.2–24.0)
Testosterone: 611 ng/dL (ref 264–916)

## 2018-06-14 ENCOUNTER — Ambulatory Visit (INDEPENDENT_AMBULATORY_CARE_PROVIDER_SITE_OTHER): Payer: BLUE CROSS/BLUE SHIELD | Admitting: Psychology

## 2018-06-14 DIAGNOSIS — F4323 Adjustment disorder with mixed anxiety and depressed mood: Secondary | ICD-10-CM | POA: Diagnosis not present

## 2018-06-15 ENCOUNTER — Ambulatory Visit: Payer: BLUE CROSS/BLUE SHIELD | Admitting: Psychology

## 2018-06-17 ENCOUNTER — Ambulatory Visit (INDEPENDENT_AMBULATORY_CARE_PROVIDER_SITE_OTHER): Payer: BLUE CROSS/BLUE SHIELD | Admitting: Psychology

## 2018-06-17 DIAGNOSIS — F4323 Adjustment disorder with mixed anxiety and depressed mood: Secondary | ICD-10-CM | POA: Diagnosis not present

## 2018-06-17 DIAGNOSIS — F4312 Post-traumatic stress disorder, chronic: Secondary | ICD-10-CM | POA: Diagnosis not present

## 2018-06-22 ENCOUNTER — Ambulatory Visit (INDEPENDENT_AMBULATORY_CARE_PROVIDER_SITE_OTHER): Payer: BLUE CROSS/BLUE SHIELD | Admitting: Psychology

## 2018-06-22 DIAGNOSIS — F4312 Post-traumatic stress disorder, chronic: Secondary | ICD-10-CM

## 2018-06-24 ENCOUNTER — Ambulatory Visit (INDEPENDENT_AMBULATORY_CARE_PROVIDER_SITE_OTHER): Payer: Medicare Other | Admitting: Psychology

## 2018-06-24 DIAGNOSIS — F4323 Adjustment disorder with mixed anxiety and depressed mood: Secondary | ICD-10-CM

## 2018-06-24 DIAGNOSIS — F4312 Post-traumatic stress disorder, chronic: Secondary | ICD-10-CM | POA: Diagnosis not present

## 2018-06-29 ENCOUNTER — Telehealth: Payer: Self-pay | Admitting: Hematology

## 2018-06-29 ENCOUNTER — Ambulatory Visit: Payer: BLUE CROSS/BLUE SHIELD | Admitting: Psychology

## 2018-06-29 NOTE — Telephone Encounter (Signed)
Per 4/28 schedule message cancelled 5/11 lab/fu. Per schedule message keep scheduled appointments for 6/10. Left message for patient. Updated schedule mailed.

## 2018-07-01 ENCOUNTER — Telehealth: Payer: Self-pay | Admitting: *Deleted

## 2018-07-01 ENCOUNTER — Ambulatory Visit: Payer: Medicare Other | Admitting: Psychology

## 2018-07-01 DIAGNOSIS — M5416 Radiculopathy, lumbar region: Secondary | ICD-10-CM

## 2018-07-01 DIAGNOSIS — L4050 Arthropathic psoriasis, unspecified: Secondary | ICD-10-CM

## 2018-07-01 MED ORDER — MORPHINE SULFATE ER 60 MG PO CP24: 60.0000 mg | ORAL_CAPSULE | Freq: Two times a day (BID) | ORAL | 0 refills | Status: DC

## 2018-07-01 NOTE — Telephone Encounter (Signed)
PMP was reviewed. MS Contin e-scribed

## 2018-07-01 NOTE — Telephone Encounter (Signed)
Shonta called and they do not have the medication  (Morphine) at the [address] location but they do have it at the [address] location.  Can you please move the Rx to that [facility]. He will be out by Saturday.

## 2018-07-05 ENCOUNTER — Encounter: Payer: Medicare Other | Attending: Physical Medicine & Rehabilitation | Admitting: Registered Nurse

## 2018-07-05 ENCOUNTER — Encounter: Payer: Self-pay | Admitting: Registered Nurse

## 2018-07-05 ENCOUNTER — Other Ambulatory Visit: Payer: Self-pay

## 2018-07-05 VITALS — BP 105/81 | HR 88 | Temp 98.2°F | Ht 68.0 in | Wt 219.2 lb

## 2018-07-05 DIAGNOSIS — Z9181 History of falling: Secondary | ICD-10-CM | POA: Insufficient documentation

## 2018-07-05 DIAGNOSIS — M25512 Pain in left shoulder: Secondary | ICD-10-CM | POA: Diagnosis not present

## 2018-07-05 DIAGNOSIS — Z79899 Other long term (current) drug therapy: Secondary | ICD-10-CM

## 2018-07-05 DIAGNOSIS — L4050 Arthropathic psoriasis, unspecified: Secondary | ICD-10-CM

## 2018-07-05 DIAGNOSIS — Z5181 Encounter for therapeutic drug level monitoring: Secondary | ICD-10-CM

## 2018-07-05 DIAGNOSIS — I69351 Hemiplegia and hemiparesis following cerebral infarction affecting right dominant side: Secondary | ICD-10-CM | POA: Insufficient documentation

## 2018-07-05 DIAGNOSIS — M170 Bilateral primary osteoarthritis of knee: Secondary | ICD-10-CM

## 2018-07-05 DIAGNOSIS — M7062 Trochanteric bursitis, left hip: Secondary | ICD-10-CM

## 2018-07-05 DIAGNOSIS — I69398 Other sequelae of cerebral infarction: Secondary | ICD-10-CM | POA: Insufficient documentation

## 2018-07-05 DIAGNOSIS — K900 Celiac disease: Secondary | ICD-10-CM | POA: Insufficient documentation

## 2018-07-05 DIAGNOSIS — Z96652 Presence of left artificial knee joint: Secondary | ICD-10-CM

## 2018-07-05 DIAGNOSIS — D751 Secondary polycythemia: Secondary | ICD-10-CM | POA: Insufficient documentation

## 2018-07-05 DIAGNOSIS — D6859 Other primary thrombophilia: Secondary | ICD-10-CM | POA: Insufficient documentation

## 2018-07-05 DIAGNOSIS — Z87890 Personal history of sex reassignment: Secondary | ICD-10-CM | POA: Insufficient documentation

## 2018-07-05 DIAGNOSIS — M75101 Unspecified rotator cuff tear or rupture of right shoulder, not specified as traumatic: Secondary | ICD-10-CM | POA: Diagnosis not present

## 2018-07-05 DIAGNOSIS — R209 Unspecified disturbances of skin sensation: Secondary | ICD-10-CM | POA: Insufficient documentation

## 2018-07-05 DIAGNOSIS — Z79891 Long term (current) use of opiate analgesic: Secondary | ICD-10-CM | POA: Insufficient documentation

## 2018-07-05 DIAGNOSIS — G8929 Other chronic pain: Secondary | ICD-10-CM

## 2018-07-05 DIAGNOSIS — F418 Other specified anxiety disorders: Secondary | ICD-10-CM | POA: Insufficient documentation

## 2018-07-05 DIAGNOSIS — M5416 Radiculopathy, lumbar region: Secondary | ICD-10-CM

## 2018-07-05 DIAGNOSIS — M545 Low back pain: Secondary | ICD-10-CM | POA: Insufficient documentation

## 2018-07-05 DIAGNOSIS — Z8673 Personal history of transient ischemic attack (TIA), and cerebral infarction without residual deficits: Secondary | ICD-10-CM

## 2018-07-05 DIAGNOSIS — G894 Chronic pain syndrome: Secondary | ICD-10-CM

## 2018-07-05 DIAGNOSIS — Z86718 Personal history of other venous thrombosis and embolism: Secondary | ICD-10-CM | POA: Insufficient documentation

## 2018-07-05 DIAGNOSIS — M7522 Bicipital tendinitis, left shoulder: Secondary | ICD-10-CM | POA: Insufficient documentation

## 2018-07-05 DIAGNOSIS — M13862 Other specified arthritis, left knee: Secondary | ICD-10-CM | POA: Insufficient documentation

## 2018-07-05 DIAGNOSIS — I69320 Aphasia following cerebral infarction: Secondary | ICD-10-CM | POA: Insufficient documentation

## 2018-07-05 DIAGNOSIS — S43002A Unspecified subluxation of left shoulder joint, initial encounter: Secondary | ICD-10-CM | POA: Insufficient documentation

## 2018-07-05 DIAGNOSIS — M7061 Trochanteric bursitis, right hip: Secondary | ICD-10-CM

## 2018-07-05 DIAGNOSIS — Z7901 Long term (current) use of anticoagulants: Secondary | ICD-10-CM | POA: Insufficient documentation

## 2018-07-05 MED ORDER — OXYCODONE HCL 10 MG PO TABS: 10.0000 mg | ORAL_TABLET | Freq: Three times a day (TID) | ORAL | 0 refills | Status: DC | PRN

## 2018-07-05 MED ORDER — MORPHINE SULFATE ER 60 MG PO CP24: 60.0000 mg | ORAL_CAPSULE | Freq: Two times a day (BID) | ORAL | 0 refills | Status: DC

## 2018-07-05 NOTE — Progress Notes (Signed)
Subjective:    Patient ID: Jesse Bowers, adult    DOB: 18-Nov-1965, 53 y.o.   MRN: [id number]  HPI: Jesse Bowers is a 53 y.o.  male, his appointment was changed, due to national recommendations of social distancing due to [place] 19, an audio/video telehealth visit is felt to be most appropriate for this patient at this time.  See Chart message from today for the patient's consent to telehealth from [place].     He states his pain is located in his bilateral shoulders, lower back pain radiating into his left buttock, left lower extremity and left foot. Also reports bilateral hand pain, bilateral hip pain and  bilateral knees. Jesse Bowers asked if he could receive a Lumbar Epidural Steroid  injection, his last Lumbar Epidural Steroid Injection was on 08/07/2017, he received good relief. We will schedule him for Left  L5-S1 Translaminar  with Dr. Letta Pate, he verbalizes understanding. He rates his pain 6. His current exercise regime is walking and performing stretching exercises.  Jesse Bowers Morphine equivalent is 165.00  MME.  He is also prescribed Clorazepate by Dr. Erling Cruz.We have discussed the black box warning of using opioids and benzodiazepines. I highlighted the dangers of using these drugs together and discussed the adverse events including respiratory suppression, overdose, cognitive impairment and importance of compliance with current regimen. We will continue to monitor and adjust as indicated.  He is being closely monitored and under the care of his psychiatrist Dr. Erling Cruz.   [name] asked the Health and History Questions. This provider and [name]  verified we were speaking with the correct person using two identifiers.   Pain Inventory Average Pain 8 Pain Right Now 6 My pain is constant, sharp, tingling and aching  In the last 24 hours, has pain interfered with the following? General activity 9 Relation with others 9 Enjoyment of life  9 What TIME of day is your pain at its worst? morning, night Sleep (in general) Poor  Pain is worse with: walking, sitting, standing and some activites Pain improves with: rest, therapy/exercise and medication Relief from Meds: 5  Mobility walk with assistance use a walker use a wheelchair  Function disabled: date disabled .  Neuro/Psych bladder control problems weakness numbness tremor tingling trouble walking spasms anxiety loss of taste or smell  Prior Studies Any changes since last visit?  no  Physicians involved in your care Any changes since last visit?  no   Family History  Problem Relation Age of Onset  . Stroke Maternal Grandfather        53  . Heart attack Maternal Grandfather   . Hypertension Mother   . Psoriasis Mother   . Other Mother        meningioma developed ~2019  . Cancer Paternal Grandfather   . Heart attack Paternal Grandfather   . Stroke Paternal Uncle        age 15  . Polycythemia Paternal Uncle   . Stroke Maternal Grandmother   . Congestive Heart Failure Maternal Grandmother   . Heart attack Maternal Grandmother   . Protein C deficiency Sister 59       Miscarriages   Social History   Socioeconomic History  . Marital status: Married    Spouse name: Not on file  . Number of children: 2  . Years of education: 4y college  . Highest education level: Not on file  Occupational History  . Occupation: Pediatric Nurse practitioner  Comment: Not working since CVA 2015  Social Needs  . Financial resource strain: Not on file  . Food insecurity:    Worry: Not on file    Inability: Not on file  . Transportation needs:    Medical: Not on file    Non-medical: Not on file  Tobacco Use  . Smoking status: Never Smoker  . Smokeless tobacco: Never Used  Substance and Sexual Activity  . Alcohol use: Yes    Frequency: Never    Comment: social  . Drug use: No  . Sexual activity: Yes    Birth control/protection: None    Comment:  patient is a transgender on testosterone shots, no biological kids  Lifestyle  . Physical activity:    Days per week: Not on file    Minutes per session: Not on file  . Stress: Not on file  Relationships  . Social connections:    Talks on phone: Not on file    Gets together: Not on file    Attends religious service: Not on file    Active member of club or organization: Not on file    Attends meetings of clubs or organizations: Not on file    Relationship status: Not on file  Other Topics Concern  . Not on file  Social History Narrative   Education 4 year college, former Therapist, sports X 15 years, pediatric nurse practitioner x 6 years, did NP degree from [employer] [place]. Relocated to [place] about 2 months ago from [place], [place]. Patient was in [place] for last 4 years and prior to that in [place]. His wife is working as Scientist, research (physical sciences) for [employer]. Patient is not working and applying for disability. They have 2 kids but no biologic children.    Past Surgical History:  Procedure Laterality Date  . ABDOMINAL HYSTERECTOMY Bilateral 1994   TAH, BSO- tranverse incision at 53 yo  . ANKLE ARTHROSCOPY WITH RECONSTRUCTION Right 2007  . CHOLECYSTECTOMY     laparoscopic  . COLONOSCOPY     x3  . EYE SURGERY     Left eye 03/02/2018, right 02/15/2018  . HIP ARTHROSCOPY W/ LABRAL REPAIR Right 05/11/2013   acetabular labral tear 03/30/2013  . KNEE ARTHROPLASTY Right   . KNEE JOINT MANIPULATION Left    x3 under anesthesia  . KNEE SURGERY Bilateral 1984   Right ACL, left PCL repair  . LITHOTRIPSY  2005  . LIVER BIOPSY  2013   normal results.  [name] MASTECTOMY Bilateral    prior to 2009  . MOUTH SURGERY    . NASAL SEPTUM SURGERY N/A 09/20/2015   by ENT Dr. [name]  . OVARIAN CYST SURGERY Left    size of grapefruit, was informed that she had shortened vagina  . SHOULDER SURGERY Bilateral    Right 08/15/2016, Left 11/15/2016  . THUMB ARTHROSCOPY Left   . THYROIDECTOMY, PARTIAL Left 2008  . TOTAL  KNEE REVISION Left 02/06/2016   Procedure: LEFT TOTAL KNEE REVISION;  Surgeon: [name], MD;  Location: [facility];  Service: Orthopedics;  Laterality: Left;  . TOTAL KNEE REVISION Left 04/22/2017   Procedure: Left knee polyethylene revision;  Surgeon: [name], MD;  Location: [facility];  Service: Orthopedics;  Laterality: Left;  . UPPER GI ENDOSCOPY  2003   Past Medical History:  Diagnosis Date  . Abnormal weight loss   . Anxiety   . Arthritis   . Celiac disease   . Cervical neck pain with evidence of disc disease  patient has a cyst   . Chronic constipation   . Chronic diastolic heart failure ([facility])    Pt. denies  . Chronic pain   . Degenerative disc disease at L5-S1 level    with stenosis  . DVT (deep venous thrombosis) ([facility])    Right upper arm, bilateral leg  . Eczema    inguinal, feet  . Elevated liver enzymes   . 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 Family history of adverse reaction to anesthesia    family has problems with anesthesia of nausea and vomiting   . 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 GERD (gastroesophageal reflux disease)    History of in 20's  . Gluten enteropathy   . H/O parotitis    right   . Hard of hearing   . History of kidney stones   . History of retinal tear    Bilateral  . History of staph infection    required wound vac  . Hx-TIA (transient ischemic attack)    2015  . LVH (left ventricular hypertrophy) 12/15/2016   Mild, noted on ECHO  . MVP (mitral valve prolapse)   . NAFL (nonalcoholic fatty liver)   . Neck pain   . Neuromuscular disorder ([facility])    bilateral neuropathy feet.  . Pneumonia 12/17/2010  . Polycythemia   . Polycythemia, secondary   . PONV (postoperative nausea and vomiting)   . Protein C deficiency ([facility])    Dr. Anne Fu  . Psoriasis   . psoriatic arthritis   . PTSD (post-traumatic stress disorder)   . Scaphoid fracture of wrist 09/23/2013  . Seizure ([facility])    childhood, medication until age 13  then weanned completely off  . Sleep apnea    split night study last done by Dr. [name] 06/18/15 shows severe OSA, CSA, and hypersomnia, rec bipap  . Splenomegaly   . Stenosis of ureteropelvic junction (UPJ)    left  . Stroke [facility])    CVA vs TIA in left cerebrum causing slight right sided weakness-Dr. [name] follows  . Syrinx of spinal cord ([facility]) 01/06/2014   c spine on MRI  . Tachycardia    hx of   . Transfusion history    past history- none recent, after surgeries due to blood loss  . Wears glasses   . Wears hearing aid    BP 105/81   Pulse 88   Temp 98.2 F (36.8 C)   Ht 5' 8"  (1.727 m)   Wt 219 lb 3.2 oz (99.4 kg)   BMI 33.33 kg/m   Opioid Risk Score:   Fall Risk Score:  `1  Depression screen PHQ 2/9  Depression screen [facility] 2/9 02/02/2018 09/23/2017 08/07/2017 06/24/2017  Decreased Interest 0 0 3 0  Down, Depressed, Hopeless 0 0 3 0  PHQ - 2 Score 0 0 6 0  Altered sleeping - - - 0  Tired, decreased energy - - - 0  Change in appetite - - - 0  Feeling bad or failure about yourself  - - - 0  Trouble concentrating - - - 0  Moving slowly or fidgety/restless - - - 0  Suicidal thoughts - - - 0  PHQ-9 Score - - - 0  Some recent data might be hidden    Review of Systems  Constitutional: Negative.   HENT: Negative.   Eyes: Negative.   Respiratory: Negative.   Cardiovascular: Negative.   Gastrointestinal: Negative.   Endocrine: Negative.   Genitourinary: Negative.  Musculoskeletal: Positive for arthralgias, back pain, gait problem, myalgias, neck pain and neck stiffness.  Skin: Negative.   Allergic/Immunologic: Negative.   Neurological: Positive for tremors, weakness and numbness.       Tingling   Hematological: Negative.   Psychiatric/Behavioral: Negative.   All other systems reviewed and are negative.      Objective:   Physical Exam Vitals signs and nursing note reviewed.  Musculoskeletal:     Comments: No Physical Exam Perform: Virtual Visit  Neurological:      Mental Status: He is oriented to person, place, and time.           Assessment & Plan:  1. Psoriatic arthritis with pain in multiple areas, most prominently feet, hands, elbows. Refilled:Kadian 60 mg 24 hr. Capsule, one capsule every 12 hours #60,  and Oxycodone 10 mg one tablet every 8 hours as needed #90. Second script sent for the following month. 07/05/2018 We will continue the opioid monitoring program, this consists of regular clinic visits, examinations, urine drug screen, pill counts as well as use of [place] Controlled Substance Reporting System.  Rheumatology Following. 2. Chronic Pain Syndrome: Continue Compound Cream.07/05/2018 3. 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 Wynelle Link perform Total Left Knee Revision on 02/06/2016. S/PLeft Knee Revision on 04/22/17. He's scheduled for Right TKA on 05/17/2018 with Dr. Wynelle Link. 5. Chronic mid- low back pain: Continue current medication regime, and encourage to increase activity as tolerated.07/05/2018 6. Polycythemia: Oncology Following.07/05/2018 7. Depression with anxiety : Continue to Monitor.07/05/2018 8. Cervicalgia: No complaints today. Continue HEP as tolerated and Continue to Monitor. Continue current medication regime. 07/05/2018 9. Muscle Spasticity: ContinueBaclofen05/06/2018 10.Bilateral Ankle Pain: No complaints today. Continue to Monitor. 07/05/2018 10. Left lateral epicondylitis:No complaints today.Ortho Following.07/05/2018 11. Bilateral Shoulder Pain:Ortho Following.07/05/2018 12. Lumbar Radiculitis: We will schedule him for  Left L5-S1 Translaminar  Injection with Dr. Letta Pate, Dr. Letta Pate in agreement with the  above.. S/P ESI with Dr. Letta Pate. 08/07/2017, good relief noted.07/05/2018 13.BilateralGreater Trochanteric Bursitis: Continue with Ice and Heat Therapy: Contiue HEP as Tolerated. Continue to Monitor. 07/05/2018.   Telephone Call  Location of patient: In his Home Location of provider: Office Established patient Time spent on call: 15 Minutes

## 2018-07-06 ENCOUNTER — Encounter: Payer: Self-pay | Admitting: Registered Nurse

## 2018-07-06 ENCOUNTER — Ambulatory Visit (INDEPENDENT_AMBULATORY_CARE_PROVIDER_SITE_OTHER): Payer: Medicare Other | Admitting: Psychology

## 2018-07-06 DIAGNOSIS — F4312 Post-traumatic stress disorder, chronic: Secondary | ICD-10-CM

## 2018-07-08 ENCOUNTER — Ambulatory Visit (INDEPENDENT_AMBULATORY_CARE_PROVIDER_SITE_OTHER): Payer: Medicare Other | Admitting: Psychology

## 2018-07-08 DIAGNOSIS — F4312 Post-traumatic stress disorder, chronic: Secondary | ICD-10-CM

## 2018-07-09 ENCOUNTER — Encounter (HOSPITAL_BASED_OUTPATIENT_CLINIC_OR_DEPARTMENT_OTHER): Payer: Medicare Other | Admitting: Physical Medicine & Rehabilitation

## 2018-07-09 ENCOUNTER — Other Ambulatory Visit: Payer: Self-pay

## 2018-07-09 VITALS — BP 135/92 | HR 99 | Temp 98.7°F | Ht 69.0 in | Wt 219.8 lb

## 2018-07-09 DIAGNOSIS — D6859 Other primary thrombophilia: Secondary | ICD-10-CM | POA: Diagnosis not present

## 2018-07-09 DIAGNOSIS — D751 Secondary polycythemia: Secondary | ICD-10-CM | POA: Diagnosis not present

## 2018-07-09 DIAGNOSIS — Z79899 Other long term (current) drug therapy: Secondary | ICD-10-CM | POA: Diagnosis not present

## 2018-07-09 DIAGNOSIS — Z87890 Personal history of sex reassignment: Secondary | ICD-10-CM | POA: Diagnosis not present

## 2018-07-09 DIAGNOSIS — Z9181 History of falling: Secondary | ICD-10-CM | POA: Diagnosis not present

## 2018-07-09 DIAGNOSIS — M7522 Bicipital tendinitis, left shoulder: Secondary | ICD-10-CM | POA: Diagnosis not present

## 2018-07-09 DIAGNOSIS — I69351 Hemiplegia and hemiparesis following cerebral infarction affecting right dominant side: Secondary | ICD-10-CM | POA: Diagnosis not present

## 2018-07-09 DIAGNOSIS — F418 Other specified anxiety disorders: Secondary | ICD-10-CM | POA: Diagnosis not present

## 2018-07-09 DIAGNOSIS — I69398 Other sequelae of cerebral infarction: Secondary | ICD-10-CM | POA: Diagnosis not present

## 2018-07-09 DIAGNOSIS — Z86718 Personal history of other venous thrombosis and embolism: Secondary | ICD-10-CM | POA: Diagnosis not present

## 2018-07-09 DIAGNOSIS — K900 Celiac disease: Secondary | ICD-10-CM | POA: Diagnosis not present

## 2018-07-09 DIAGNOSIS — S43002A Unspecified subluxation of left shoulder joint, initial encounter: Secondary | ICD-10-CM | POA: Diagnosis not present

## 2018-07-09 DIAGNOSIS — L4050 Arthropathic psoriasis, unspecified: Secondary | ICD-10-CM | POA: Diagnosis not present

## 2018-07-09 DIAGNOSIS — M545 Low back pain: Secondary | ICD-10-CM | POA: Diagnosis not present

## 2018-07-09 DIAGNOSIS — G8929 Other chronic pain: Secondary | ICD-10-CM | POA: Diagnosis present

## 2018-07-09 DIAGNOSIS — M13862 Other specified arthritis, left knee: Secondary | ICD-10-CM | POA: Diagnosis not present

## 2018-07-09 DIAGNOSIS — M5416 Radiculopathy, lumbar region: Secondary | ICD-10-CM

## 2018-07-09 DIAGNOSIS — R209 Unspecified disturbances of skin sensation: Secondary | ICD-10-CM | POA: Diagnosis not present

## 2018-07-09 DIAGNOSIS — Z7901 Long term (current) use of anticoagulants: Secondary | ICD-10-CM | POA: Diagnosis not present

## 2018-07-09 DIAGNOSIS — Z79891 Long term (current) use of opiate analgesic: Secondary | ICD-10-CM | POA: Diagnosis not present

## 2018-07-09 DIAGNOSIS — I69320 Aphasia following cerebral infarction: Secondary | ICD-10-CM | POA: Diagnosis not present

## 2018-07-09 NOTE — Progress Notes (Signed)
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   Last xarelto dose 07/05/2018  Informed consent was obtained after describing risk and benefits of the procedure with the patient, this includes bleeding, bruising, infection, paralysis and medication side effects.  The patient wishes to proceed and has given written consent.  Patient was placed in a prone position.  The lumbar area was marked and prepped with Betadine.  It was entered with a 25-gauge 1-1/2 inch needle and one mL of 1% lidocaine was injected into the skin and subcutaneous tissue.  Then a 17-gauge spinal needle was inserted under fluoroscopic guidance into the Left L5-S1 interlaminar space under AP and Lateral imaging.  Once needle tip of approximated the posterior elements, a loss of resistance technique was utilized with lateral imaging.  A positive loss of resistance was obtained and then confirmed by injecting 2 mL's of Isovue 200.  Then a solution containing 1.5 mL's of 67m/ml Celestone and 1.5 mL's of 1% lidocaine was injected.  The patient tolerated procedure well.  Post procedure instructions were given.  Please see post procedure form. May resume Xarelto 07/10/2018

## 2018-07-09 NOTE — Progress Notes (Signed)
  PROCEDURE RECORD [facility]   Name: Jesse Bowers DOB:[birth year] MRN: [id number]  Date:07/09/2018  Physician: Alysia Penna, MD    Nurse/CMA: Truman Hayward, CMA  Allergies:  Allergies  Allergen Reactions  . Penicillin G Anaphylaxis and Other (See Comments)    Has patient had a PCN reaction causing immediate rash, facial/tongue/throat swelling, SOB or lightheadedness with hypotension: Yes Has patient had a PCN reaction causing severe rash involving mucus membranes or skin necrosis: No Has patient had a PCN reaction that required hospitalization Yes Has patient had a PCN reaction occurring within the last 10 years: No If all of the above answers are "NO", then may proceed with Cephalosporin use.   . Sulfa Antibiotics Rash    Stevens-Johnson rash  . Vancomycin Rash and Other (See Comments)    RED MAN SYNDROME CAN HAVE IF GIVEN OVER 2HOURS  . Duloxetine Other (See Comments)    Restless legs  . Gabapentin Nausea Only and Other (See Comments)    Other reaction(s): nausea, mental status Drowsiness and restlessness.  . Tegaderm Ag Mesh [Silver] Dermatitis    Causes blistering wounds   . Citalopram Other (See Comments)    Dystonia  . Wheat Bran     Due to Celiac  . Acetaminophen Other (See Comments)    Elevates liver enzymes.   . Ibuprofen Other (See Comments)    Contraindicated with Xarelto.   . Nortriptyline Other (See Comments)    Dry mouth at 25 mg dose.  Tolerates 10 mg dose  . Pregabalin Other (See Comments)    Ineffective  . Sulfacetamide Sodium-Sulfur Rash    Consent Signed: Yes.    Is patient diabetic? No.  CBG today?   Pregnant: No. LMP: No LMP recorded. Patient has had a hysterectomy. (age 42-55)  Anticoagulants: yes (been off Xarelto for 3 days) Anti-inflammatory: no Antibiotics: no  Procedure: L5-S1 Translaminar Epidural injection Position: Prone Start Time: 11:58am   End Time:12:02pm   Fluoro Time: 25  RN/CMA Trevonte Ashkar,CMA  Amaris Delafuente,CMA    Time 11:18am 12:09pm    BP 135/92 144/90    Pulse 99 104    Respirations 16 16    O2 Sat 98 95    S/S 6/6 6/6    Pain Level 8/10 6/10     D/C home with wife, patient A & O X 3, D/C instructions reviewed, and sits independently.

## 2018-07-09 NOTE — Patient Instructions (Signed)
You received an epidural steroid injection under fluoroscopic guidance. This is the most accurate way to perform an epidural injection. This injection was performed to relieve thigh or leg or foot pain that may be related to a pinched nerve in the lumbar spine. The local anesthetic injected today may cause numbness in your leg for a couple hours. If it is severe we may need to observe you for 30-60 minutes after the injection. The cortisone medicine injected today may take several days to take full effect. This medicine can also cause facial flushing or feeling of being warm.  This injection may last for days weeks or months. It can be repeated if needed. If it is not effective, another spinal level may need to be injected. Other treatments include medication management as well as physical therapy. In some cases surgery may be an option. May restart Xarelto 17m daily on 07/10/2018

## 2018-07-12 ENCOUNTER — Other Ambulatory Visit: Payer: Self-pay

## 2018-07-12 ENCOUNTER — Ambulatory Visit: Payer: Self-pay | Admitting: Hematology

## 2018-07-13 ENCOUNTER — Ambulatory Visit (INDEPENDENT_AMBULATORY_CARE_PROVIDER_SITE_OTHER): Payer: Medicare Other | Admitting: Psychology

## 2018-07-13 DIAGNOSIS — F4312 Post-traumatic stress disorder, chronic: Secondary | ICD-10-CM

## 2018-07-15 ENCOUNTER — Ambulatory Visit (INDEPENDENT_AMBULATORY_CARE_PROVIDER_SITE_OTHER): Payer: BLUE CROSS/BLUE SHIELD | Admitting: Psychology

## 2018-07-15 DIAGNOSIS — F4312 Post-traumatic stress disorder, chronic: Secondary | ICD-10-CM

## 2018-07-20 ENCOUNTER — Ambulatory Visit (INDEPENDENT_AMBULATORY_CARE_PROVIDER_SITE_OTHER): Payer: Medicare Other | Admitting: Psychology

## 2018-07-20 DIAGNOSIS — F4312 Post-traumatic stress disorder, chronic: Secondary | ICD-10-CM | POA: Diagnosis not present

## 2018-07-22 ENCOUNTER — Ambulatory Visit (INDEPENDENT_AMBULATORY_CARE_PROVIDER_SITE_OTHER): Payer: Medicare Other | Admitting: Psychology

## 2018-07-22 DIAGNOSIS — F4312 Post-traumatic stress disorder, chronic: Secondary | ICD-10-CM | POA: Diagnosis not present

## 2018-07-27 ENCOUNTER — Ambulatory Visit (INDEPENDENT_AMBULATORY_CARE_PROVIDER_SITE_OTHER): Payer: Medicare Other | Admitting: Psychology

## 2018-07-27 DIAGNOSIS — F4312 Post-traumatic stress disorder, chronic: Secondary | ICD-10-CM

## 2018-08-03 ENCOUNTER — Ambulatory Visit (INDEPENDENT_AMBULATORY_CARE_PROVIDER_SITE_OTHER): Payer: Medicare Other | Admitting: Psychology

## 2018-08-03 DIAGNOSIS — F4323 Adjustment disorder with mixed anxiety and depressed mood: Secondary | ICD-10-CM

## 2018-08-05 ENCOUNTER — Ambulatory Visit (INDEPENDENT_AMBULATORY_CARE_PROVIDER_SITE_OTHER): Payer: Medicare Other | Admitting: Psychology

## 2018-08-05 DIAGNOSIS — F4312 Post-traumatic stress disorder, chronic: Secondary | ICD-10-CM

## 2018-08-05 DIAGNOSIS — F4323 Adjustment disorder with mixed anxiety and depressed mood: Secondary | ICD-10-CM

## 2018-08-10 ENCOUNTER — Ambulatory Visit (INDEPENDENT_AMBULATORY_CARE_PROVIDER_SITE_OTHER): Payer: Medicare Other | Admitting: Psychology

## 2018-08-10 DIAGNOSIS — F4312 Post-traumatic stress disorder, chronic: Secondary | ICD-10-CM

## 2018-08-11 ENCOUNTER — Ambulatory Visit: Payer: Self-pay | Admitting: Hematology

## 2018-08-11 ENCOUNTER — Inpatient Hospital Stay: Payer: Medicare Other

## 2018-08-11 ENCOUNTER — Other Ambulatory Visit: Payer: Medicare Other

## 2018-08-12 ENCOUNTER — Ambulatory Visit (INDEPENDENT_AMBULATORY_CARE_PROVIDER_SITE_OTHER): Payer: Medicare Other | Admitting: Psychology

## 2018-08-12 DIAGNOSIS — F4312 Post-traumatic stress disorder, chronic: Secondary | ICD-10-CM

## 2018-08-12 DIAGNOSIS — F4323 Adjustment disorder with mixed anxiety and depressed mood: Secondary | ICD-10-CM

## 2018-08-17 ENCOUNTER — Ambulatory Visit (INDEPENDENT_AMBULATORY_CARE_PROVIDER_SITE_OTHER): Payer: Medicare Other | Admitting: Psychology

## 2018-08-17 DIAGNOSIS — F4312 Post-traumatic stress disorder, chronic: Secondary | ICD-10-CM

## 2018-08-17 NOTE — Patient Instructions (Addendum)
YOU ARE REQUIRED TO BE TESTED FOR COVID-19 PRIOR TO YOUR SURGERY . YOUR TEST MUST BE COMPLETED ON Thursday June 18th. TESTING IS LOCATED AT [facility] FROM 9:00AM - 3:00PM. FAILURE TO COMPLETE TESTING MAY RESULT IN CANCELLATION OF YOUR SURGERY. ONCE YOUR COVID TEST IS COMPLETED, PLEASE BEGIN THE QUARANTINE INSTRUCTIONS AS OUTLINED IN YOUR HANDOUT.                Jesse Bowers    Your procedure is scheduled on: 08-23-2018    Report to [facility] Main  Entrance    Report to admitting at 8:55 AM     Call this number if you have problems the morning of surgery [phone]     BRING BIPAP MASK AND TUBING ONLY. DEVICE WILL BE PROVIDED      Remember:  BRUSH YOUR TEETH MORNING OF SURGERY AND RINSE YOUR MOUTH OUT, NO CHEWING GUM CANDY OR MINTS.    NO SOLID FOOD AFTER MIDNIGHT THE NIGHT PRIOR TO SURGERY. NOTHING BY MOUTH EXCEPT CLEAR LIQUIDS UNTIL 430 AM.  PLEASE FINISH ENSURE DRINK PER SURGEON ORDER 3 HOURS PRIOR TO SCHEDULED SURGERY TIME WHICH NEEDS TO BE COMPLETED AT 430 AM.    CLEAR LIQUID DIET   Foods Allowed                                                                     Foods Excluded  Coffee and tea, regular and decaf                             liquids that you cannot  Plain Jell-O in any flavor                                             see through such as: Fruit ices (not with fruit pulp)                                     milk, soups, orange juice  Iced Popsicles                                    All solid food Carbonated beverages, regular and diet                                    Cranberry, grape and apple juices Sports drinks like Gatorade Lightly seasoned clear broth or consume(fat free) Sugar, honey syrup  Sample Menu Breakfast                                Lunch  Supper Cranberry juice                    Beef broth                            Chicken broth Jell-O                                      Grape juice                           Apple juice Coffee or tea                        Jell-O                                      Popsicle                                                Coffee or tea                        Coffee or tea  _____________________________________________________________________  ## TAKE LAST DOSE OF MORPHINE ([place]) EVENING OF 08-22-2018    Take these medicines the morning of surgery with A SIP OF WATER: CLORAZEPATE (TRANXENE), VENLAFAXINE XR (EFFEXOR XR), BACLOFEN, MYBETRIQ, OXYCODONE HCL IF NEEDED                                   You may not have any metal on your body including hair pins and              piercings  Do not wear jewelry, make-up, lotions, powders or perfumes, deodorant                         Men may shave face and neck.   Do not bring valuables to the hospital. Jesse Bowers.  Contacts, dentures or bridgework may not be worn into surgery.  Leave suitcase in the car. After surgery it may be brought to your room.    _____________________________________________________________________             [facility] - Preparing for Surgery Before surgery, you can play an important role.  Because skin is not sterile, your skin needs to be as free of germs as possible.  You can reduce the number of germs on your skin by washing with  BAR OF GOLD DIAL SOAP soap before surgery.   DIAL SOAP is an antiseptic cleaner which kills germs and bonds with the skin to continue killing germs even after washing.  .  You may shave your face/neck. Please follow these instructions carefully:  1.  Shower with GOLD DIAL SOAP Soap the night before surgery and the  morning of Surgery.  2.  If you choose to wash your hair, wash your hair first as  usual with your  normal  shampoo.  3.  After you shampoo, rinse your hair and body thoroughly to remove the  shampoo.                           4.  Use   GOLD DIAL SOAP as you would any other liquid soap.  You can apply  GOLD DIAL SOAP directly  to the skin and wash                       Gently with a scrungie or clean washcloth.  5.              6.  Wash thoroughly, paying special attention to the area where your surgery  will be performed.  7.  Thoroughly rinse your body with warm water from the neck down.  8.  DO NOT shower/wash with your normal soap after using and rinsing off  The GOLD DIAL SOAP Soap.                9.  Pat yourself dry with a clean towel.            10.  Wear clean pajamas.            11.  Place clean sheets on your bed the night of your first shower and do not  sleep with pets. Day of Surgery : Do not apply any lotions/deodorants the morning of surgery.  Please wear clean clothes to the hospital/surgery center.  FAILURE TO FOLLOW THESE INSTRUCTIONS MAY RESULT IN THE CANCELLATION OF YOUR SURGERY PATIENT SIGNATURE_________________________________  NURSE SIGNATURE__________________________________  ________________________________________________________________________   [name]  An incentive spirometer is a tool that can help keep your lungs clear and active. This tool measures how well you are filling your lungs with each breath. Taking long deep breaths may help reverse or decrease the chance of developing breathing (pulmonary) problems (especially infection) following:  A long period of time when you are unable to move or be active. BEFORE THE PROCEDURE   If the spirometer includes an indicator to show your best effort, your nurse or respiratory therapist will set it to a desired goal.  If possible, sit up straight or lean slightly forward. Try not to slouch.  Hold the incentive spirometer in an upright position. INSTRUCTIONS FOR USE  1. Sit on the edge of your bed if possible, or sit up as far as you can in bed or on a chair. 2. Hold the incentive spirometer in an upright position. 3. Breathe  out normally. 4. Place the mouthpiece in your mouth and seal your lips tightly around it. 5. Breathe in slowly and as deeply as possible, raising the piston or the ball toward the top of the column. 6. Hold your breath for 3-5 seconds or for as long as possible. Allow the piston or ball to fall to the bottom of the column. 7. Remove the mouthpiece from your mouth and breathe out normally. 8. Rest for a few seconds and repeat Steps 1 through 7 at least 10 times every 1-2 hours when you are awake. Take your time and take a few normal breaths between deep breaths. 9. The spirometer may include an indicator to show your best effort. Use the indicator as a goal to work toward during each repetition. 10. After each set of 10 deep breaths, practice coughing to be sure your lungs are clear. If  you have an incision (the cut made at the time of surgery), support your incision when coughing by placing a pillow or rolled up towels firmly against it. Once you are able to get out of bed, walk around indoors and cough well. You may stop using the incentive spirometer when instructed by your caregiver.  RISKS AND COMPLICATIONS  Take your time so you do not get dizzy or light-headed.  If you are in pain, you may need to take or ask for pain medication before doing incentive spirometry. It is harder to take a deep breath if you are having pain. AFTER USE  Rest and breathe slowly and easily.  It can be helpful to keep track of a log of your progress. Your caregiver can provide you with a simple table to help with this. If you are using the spirometer at home, follow these instructions: [place] IF:   You are having difficultly using the spirometer.  You have trouble using the spirometer as often as instructed.  Your pain medication is not giving enough relief while using the spirometer.  You develop fever of 100.5 F (38.1 C) or higher. SEEK IMMEDIATE MEDICAL CARE IF:   You cough up bloody  sputum that had not been present before.  You develop fever of 102 F (38.9 C) or greater.  You develop worsening pain at or near the incision site. MAKE SURE YOU:   Understand these instructions.  Will watch your condition.  Will get help right away if you are not doing well or get worse. Document Released: 06/30/2006 Document Revised: 05/12/2011 Document Reviewed: 08/31/2006 [employer] Patient Information 2014 [employer], [place].   ________________________________________________________________________  WHAT IS A BLOOD TRANSFUSION? Blood Transfusion Information  A transfusion is the replacement of blood or some of its parts. Blood is made up of multiple cells which provide different functions.  Red blood cells carry oxygen and are used for blood loss replacement.  White blood cells fight against infection.  Platelets control bleeding.  Plasma helps clot blood.  Other blood products are available for specialized needs, such as hemophilia or other clotting disorders. BEFORE THE TRANSFUSION  Who gives blood for transfusions?   Healthy volunteers who are fully evaluated to make sure their blood is safe. This is blood bank blood. Transfusion therapy is the safest it has ever been in the practice of medicine. Before blood is taken from a donor, a complete history is taken to make sure that person has no history of diseases nor engages in risky social behavior (examples are intravenous drug use or sexual activity with multiple partners). The donor's travel history is screened to minimize risk of transmitting infections, such as malaria. The donated blood is tested for signs of infectious diseases, such as HIV and hepatitis. The blood is then tested to be sure it is compatible with you in order to minimize the chance of a transfusion reaction. If you or a relative donates blood, this is often done in anticipation of surgery and is not appropriate for emergency situations. It takes many days  to process the donated blood. RISKS AND COMPLICATIONS Although transfusion therapy is very safe and saves many lives, the main dangers of transfusion include:   Getting an infectious disease.  Developing a transfusion reaction. This is an allergic reaction to something in the blood you were given. Every precaution is taken to prevent this. The decision to have a blood transfusion has been considered carefully by your caregiver before blood is given. Blood  is not given unless the benefits outweigh the risks. AFTER THE TRANSFUSION  Right after receiving a blood transfusion, you will usually feel much better and more energetic. This is especially true if your red blood cells have gotten low (anemic). The transfusion raises the level of the red blood cells which carry oxygen, and this usually causes an energy increase.  The nurse administering the transfusion will monitor you carefully for complications. HOME CARE INSTRUCTIONS  No special instructions are needed after a transfusion. You may find your energy is better. Speak with your caregiver about any limitations on activity for underlying diseases you may have. SEEK MEDICAL CARE IF:   Your condition is not improving after your transfusion.  You develop redness or irritation at the intravenous (IV) site. SEEK IMMEDIATE MEDICAL CARE IF:  Any of the following symptoms occur over the next 12 hours:  Shaking chills.  You have a temperature by mouth above 102 F (38.9 C), not controlled by medicine.  Chest, back, or muscle pain.  People around you feel you are not acting correctly or are confused.  Shortness of breath or difficulty breathing.  Dizziness and fainting.  You get a rash or develop hives.  You have a decrease in urine output.  Your urine turns a dark color or changes to pink, red, or brown. Any of the following symptoms occur over the next 10 days:  You have a temperature by mouth above 102 F (38.9 C), not controlled  by medicine.  Shortness of breath.  Weakness after normal activity.  The white part of the eye turns yellow (jaundice).  You have a decrease in the amount of urine or are urinating less often.  Your urine turns a dark color or changes to pink, red, or brown. Document Released: 02/15/2000 Document Revised: 05/12/2011 Document Reviewed: 10/04/2007 [facility] Patient Information 2014 [place], [place].  _______________________________________________________________________

## 2018-08-17 NOTE — Progress Notes (Signed)
SPOKE WITH JESSICA ZANETTO PA AND PATIENT IS TO TAKE LASY DOSE OF MORPHINE ([place]) [place] 08-22-2018. PATIENT MAY USE OXYCODONE HCL FOR BREAKTHROUGH PAIN AM OF SURGERY 08-23-18.

## 2018-08-17 NOTE — Progress Notes (Addendum)
CARDIAC CLEARANCE NOTE HAO MENG PA 05-07-18 Epic NOTE TO HOLD XARELTO X 3 DAYS PER HAO MENG PA CHEST XRAY 05-12-18 Epic EKG 05-07-18 Epic ECHO 12-15-16 EPIC

## 2018-08-17 NOTE — Progress Notes (Signed)
Cardiac clearance hao meng received by fax and placed on pt chart

## 2018-08-18 ENCOUNTER — Encounter (HOSPITAL_COMMUNITY): Payer: Self-pay

## 2018-08-18 ENCOUNTER — Encounter (HOSPITAL_COMMUNITY)
Admission: RE | Admit: 2018-08-18 | Discharge: 2018-08-18 | Disposition: A | Discharge status: Home / Self Care | Payer: Medicare Other | Source: Ambulatory Visit | Attending: Orthopedic Surgery | Admitting: Orthopedic Surgery

## 2018-08-18 ENCOUNTER — Other Ambulatory Visit: Payer: Self-pay

## 2018-08-18 DIAGNOSIS — M1711 Unilateral primary osteoarthritis, right knee: Secondary | ICD-10-CM | POA: Diagnosis not present

## 2018-08-18 DIAGNOSIS — Z01812 Encounter for preprocedural laboratory examination: Secondary | ICD-10-CM | POA: Diagnosis not present

## 2018-08-18 LAB — COMPREHENSIVE METABOLIC PANEL
ALT: 22 U/L (ref 0–44)
AST: 28 U/L (ref 15–41)
Albumin: 4.4 g/dL (ref 3.5–5.0)
Alkaline Phosphatase: 169 U/L — ABNORMAL HIGH (ref 38–126)
Anion gap: 11 (ref 5–15)
BUN: 17 mg/dL (ref 6–20)
CO2: 26 mmol/L (ref 22–32)
Calcium: 8.9 mg/dL (ref 8.9–10.3)
Chloride: 102 mmol/L (ref 98–111)
Creatinine, Ser: 1.08 mg/dL (ref 0.61–1.24)
GFR calc Af Amer: >60 mL/min (ref >60)
GFR calc non Af Amer: >60 mL/min (ref >60)
Glucose, Bld: 115 mg/dL — ABNORMAL HIGH (ref 70–99)
Potassium: 4 mmol/L (ref 3.5–5.1)
Sodium: 139 mmol/L (ref 135–145)
Total Bilirubin: 0.4 mg/dL (ref 0.3–1.2)
Total Protein: 7.1 g/dL (ref 6.5–8.1)

## 2018-08-18 LAB — CBC
HCT: 43.3 % (ref 39.0–52.0)
Hemoglobin: 13.2 g/dL (ref 13.0–17.0)
MCH: 24.1 pg — ABNORMAL LOW (ref 26.0–34.0)
MCHC: 30.5 g/dL (ref 30.0–36.0)
MCV: 79.2 fL — ABNORMAL LOW (ref 80.0–100.0)
Platelets: 196 10*3/uL (ref 150–400)
RBC: 5.47 MIL/uL (ref 4.22–5.81)
RDW: 15.9 % — ABNORMAL HIGH (ref 11.5–15.5)
WBC: 4.2 10*3/uL (ref 4.0–10.5)
nRBC: 0 % (ref 0.0–0.2)

## 2018-08-18 LAB — APTT: aPTT: 27 seconds (ref 24–36)

## 2018-08-18 LAB — PROTIME-INR
INR: 1.1 (ref 0.8–1.2)
Prothrombin Time: 14 seconds (ref 11.4–15.2)

## 2018-08-18 LAB — SURGICAL PCR SCREEN
MRSA, PCR: NEGATIVE
Staphylococcus aureus: NEGATIVE

## 2018-08-18 NOTE — Progress Notes (Addendum)
Pre-op nurse progress note  Vitals WDL today with Exception of heart rate of 107. Patient reports he has hx of tachycardia especially when he feels anxious.   Patient reports no acute cardiac sx today.

## 2018-08-19 ENCOUNTER — Other Ambulatory Visit (HOSPITAL_COMMUNITY)
Admission: RE | Admit: 2018-08-19 | Discharge: 2018-08-19 | Disposition: A | Discharge status: Home / Self Care | Payer: Medicare Other | Source: Ambulatory Visit | Attending: Orthopedic Surgery | Admitting: Orthopedic Surgery

## 2018-08-19 ENCOUNTER — Ambulatory Visit (INDEPENDENT_AMBULATORY_CARE_PROVIDER_SITE_OTHER): Payer: Medicare Other | Admitting: Psychology

## 2018-08-19 DIAGNOSIS — F4312 Post-traumatic stress disorder, chronic: Secondary | ICD-10-CM | POA: Diagnosis not present

## 2018-08-19 DIAGNOSIS — Z01812 Encounter for preprocedural laboratory examination: Secondary | ICD-10-CM | POA: Diagnosis not present

## 2018-08-19 DIAGNOSIS — F4323 Adjustment disorder with mixed anxiety and depressed mood: Secondary | ICD-10-CM | POA: Diagnosis not present

## 2018-08-19 LAB — SARS CORONAVIRUS 2 (TAT 6-24 HRS): SARS Coronavirus 2: NEGATIVE

## 2018-08-19 NOTE — H&P (Addendum)
TOTAL KNEE ADMISSION H&P  Patient is being admitted for right total knee arthroplasty.  Subjective:  Chief Complaint:right knee pain.  HPI: Jesse Bowers, 53 y.o. adult, has a history of pain and functional disability in the right knee due to trauma and arthritis and has failed non-surgical conservative treatments for greater than 12 weeks to includeNSAID's and/or analgesics, corticosteriod injections, viscosupplementation injections, flexibility and strengthening excercises, use of assistive devices and activity modification.  Onset of symptoms was gradual, starting >10 years ago with gradually worsening course since that time. The patient noted prior procedures on the knee to include  arthroscopy and menisectomy on the right knee(s).  Patient currently rates pain in the right knee(s) at 8 out of 10 with activity. Patient has night pain, worsening of pain with activity and weight bearing, pain that interferes with activities of daily living, pain with passive range of motion, crepitus and joint swelling.  Patient has evidence of periarticular osteophytes and joint space narrowing by imaging studies. There is no active infection.  Patient Active Problem List   Diagnosis Date Noted  . Constipation due to opioid therapy 03/30/2018  . Retinopathy of both eyes 01/06/2018  . SNHL (sensorineural hearing loss) 12/04/2017  . Abnormal urinary stream 12/03/2017  . Osteoarthritis of carpometacarpal (CMC) joint of thumb 11/30/2017  . Muscle weakness 11/17/2017  . Transient vision disturbance 11/12/2017  . Bilateral hand pain 10/30/2017  . Pain of left hip joint 10/09/2017  . Gynecomastia 07/10/2017  . Iron deficiency anemia 07/05/2017  . Spasticity 05/20/2017  . Lumbar radiculitis 04/20/2017  . Ulnar 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, left 11/28/2016  . Idiopathic peripheral neuropathy 11/28/2016  . 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 Long term (current) use of anticoagulants 08/23/2015  . Right  upper quadrant abdominal pain 08/23/2015  . Memory loss 05/10/2015  . Gait abnormality 04/07/2015  . Alkaline phosphatase elevation 04/07/2015  . History of thrombosis 03/26/2015  . Medial epicondylitis 02/07/2015  . Cognitive decline 12/21/2014  . Leukopenia 12/05/2014  . Rotator cuff syndrome of right shoulder 10/27/2014  . Status post left knee replacement 08/22/2014  . Left lateral epicondylitis 08/22/2014  . Chronic cerebral ischemia 08/18/2014  . Arthrofibrosis of knee joint 08/17/2014  . Cubital canal compression syndrome, left 08/17/2014  . 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 Chronic pain syndrome 04/10/2014  . Insomnia 04/10/2014  . Chronic non-specific white matter lesions on MRI 04/10/2014  . CFS (chronic fatigue syndrome) 04/10/2014  . 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 Biceps tendonitis on left 03/01/2014  . Polycythemia vera ([place]) 12/27/2013  . H/O TIA (transient ischemic attack) and stroke 12/27/2013  . Neck pain 12/27/2013  . OSA (obstructive sleep apnea) 12/08/2013  . 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 Depression with anxiety 08/04/2013  . 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 Post traumatic stress disorder (PTSD) 08/01/2013  . Speech abnormality 07/25/2013  . Obesity 05/10/2013  . Lower extremity edema 05/10/2013  . GERD (gastroesophageal reflux disease) 05/10/2013  . Arthritis 05/10/2013  . Osteoarthritis of knee 03/15/2013  . Headache 10/25/2012  . Palpitations 10/18/2012  . Fatty liver determined by biopsy 06/01/2012  . Arthropathic psoriasis, unspecified ([place]) 04/29/2012  . Abnormal liver enzymes 03/29/2012  . Psoriatic arthritis ([place]) 12/29/2011  . Left Renal Hydronephrosis 12/11/2010  . 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 Celiac disease 05/27/2010  . Thyroid nodule 05/27/2010  . Male-to-male transgender person 09/20/2002   Past Medical History:  Diagnosis Date  . Abnormal weight loss   .  Anxiety   .  Arthritis   . Celiac disease   . Cervical neck pain with evidence of disc disease    patient has a cyst   . Chronic constipation   . Chronic diastolic heart failure ([place])    Pt. denies  . Chronic pain   . Degenerative disc disease at L5-S1 level    with stenosis  . DVT (deep venous thrombosis) ([facility])    Right upper arm, bilateral leg  . Eczema    inguinal, feet  . Elevated liver enzymes   . Failed total knee arthroplasty ([place]) 04/22/2017  . Family history of adverse reaction to anesthesia    family has problems with anesthesia of nausea and vomiting   . 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 GERD (gastroesophageal reflux disease)    History of in 20's  . Gluten enteropathy   . H/O parotitis    right   . Hard of hearing   . History of kidney stones   . History of retinal tear    Bilateral  . History of staph infection    required wound vac  . Hx-TIA (transient ischemic attack)    2015  . LVH (left ventricular hypertrophy) 12/15/2016   Mild, noted on ECHO  . MVP (mitral valve prolapse)   . NAFL (nonalcoholic fatty liver)   . Neck pain   . Neuromuscular disorder ([place])    bilateral neuropathy feet.  . Pneumonia 12/17/2010  . Polycythemia   . Polycythemia, secondary   . PONV (postoperative nausea and vomiting)   . 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 Anne Fu  . Psoriasis    16 X10 cm psoriatic rash on sole of left foot ; open and occ scant bleeding;   . psoriatic arthritis   . PTSD (post-traumatic stress disorder)   . Scaphoid fracture of wrist 09/23/2013  . Seizure ([place])    childhood, medication until age 70 then weanned completely off  . Sleep apnea    split night study last done by Dr. [name] 06/18/15 shows severe OSA, CSA, and hypersomnia, rec bipap  . Splenomegaly   . Stenosis of ureteropelvic junction (UPJ)    left  . Stroke [facility])    CVA vs TIA in left cerebrum causing slight right sided weakness-Dr. [name] follows  . Syrinx of spinal cord ([place]) 01/06/2014   c  spine on MRI  . Tachycardia    hx of   . Transfusion history    past history- none recent, after surgeries due to blood loss  . Wears glasses   . Wears hearing aid     Past Surgical History:  Procedure Laterality Date  . ABDOMINAL HYSTERECTOMY Bilateral 1994   TAH, BSO- tranverse incision at 54 yo  . ANKLE ARTHROSCOPY WITH RECONSTRUCTION Right 2007  . CHOLECYSTECTOMY     laparoscopic  . COLONOSCOPY     x3  . EYE SURGERY     Left eye 03/02/2018, right 02/15/2018  . HIP ARTHROSCOPY W/ LABRAL REPAIR Right 05/11/2013   acetabular labral tear 03/30/2013  . KNEE ARTHROPLASTY Right   . KNEE JOINT MANIPULATION Left    x3 under anesthesia  . KNEE SURGERY Bilateral 1984   Right ACL, left PCL repair  . LITHOTRIPSY  2005  . LIVER BIOPSY  2013   normal results.  [name] MASTECTOMY Bilateral    prior to 2009  . MOUTH SURGERY    . NASAL SEPTUM SURGERY N/A 09/20/2015   by ENT Dr. [name]  .  OVARIAN CYST SURGERY Left    size of grapefruit, was informed that she had shortened vagina  . SHOULDER SURGERY Bilateral    Right 08/15/2016, Left 11/15/2016  . THUMB ARTHROSCOPY Left   . THYROIDECTOMY, PARTIAL Left 2008  . TOTAL KNEE REVISION Left 02/06/2016   Procedure: LEFT TOTAL KNEE REVISION;  Surgeon: [name], MD;  Location: [facility];  Service: Orthopedics;  Laterality: Left;  . TOTAL KNEE REVISION Left 04/22/2017   Procedure: Left knee polyethylene revision;  Surgeon: [name], MD;  Location: [facility];  Service: Orthopedics;  Laterality: Left;  . UPPER GI ENDOSCOPY  2003       Current Outpatient Medications  Medication Sig Dispense Refill Last Dose  . acyclovir ointment (ZOVIRAX) 5 % Apply 1 application topically daily as needed (cold sores).   0   . antiseptic oral rinse (BIOTENE) LIQD 15 mLs by Mouth Rinse route as needed for dry mouth.     [name] augmented betamethasone dipropionate (DIPROLENE-AF) 0.05 % ointment Apply 1 application topically 2 (two) times daily.     . baclofen (LIORESAL) 20  MG tablet Take 1 tablet (20 mg total) by mouth 4 (four) times daily. 120 tablet 3   . Calcipotriene-Betameth Diprop (ENSTILAR) 0.005-0.064 % FOAM Apply 1 application topically 2 (two) times daily.      . clobetasol (TEMOVATE) 0.05 % GEL Apply 1 application topically 2 (two) times daily as needed (itching).     . clorazepate (TRANXENE) 15 MG tablet Take 15 mg by mouth at bedtime.     . clorazepate (TRANXENE) 7.5 MG tablet Take 7.5 mg by mouth 2 (two) times daily.      [name] desonide (DESOWEN) 0.05 % ointment Apply 1 application topically 2 (two) times daily as needed (psoriasis).   2   . diclofenac sodium (VOLTAREN) 1 % GEL Apply 1 application topically 3 (three) times daily. Bilateral hands (Patient taking differently: Apply 1 application topically 3 (three) times daily as needed. Bilateral hands) 3 Tube 4   . Emollient (CERAVE) CREA Apply 1 application topically 2 (two) times a day.     . Fluocinolone Acetonide 0.01 % OIL Place 3 drops into both ears 2 (two) times daily as needed for itching.     . furosemide (LASIX) 20 MG tablet Take 20 mg by mouth daily as needed for fluid or edema. AS NEEDED FOR WEIGHT GAIN OF 3LBS IN 1DAY OR 5LBS/WEEK      . furosemide (LASIX) 40 MG tablet Take 40 mg by mouth 2 (two) times daily as needed for fluid or edema.      . mirabegron ER (MYRBETRIQ) 25 MG TB24 tablet Take 1 tablet (25 mg total) by mouth daily. 90 tablet 3   . morphine (KADIAN) 60 MG 24 hr capsule Take 1 capsule (60 mg total) by mouth every 12 (twelve) hours. 60 capsule 0   . neomycin-polymyxin-hydrocortisone (CORTISPORIN) 3.5-10000-1 OTIC suspension Place 4 drops into both ears 2 (two) times daily as needed (ear pain).   1   . NONFORMULARY OR COMPOUNDED ITEM Apply 1 application topically 3 (three) times daily as needed (pain). 8% ketamine, 5% amitriptyline, 5% baclofen, 5% gabapentin AS NEEDED  60 GM 1 each 5   . Oxycodone HCl 10 MG TABS Take 1 tablet (10 mg total) by mouth every 8 (eight) hours as needed  ((score 7 to 10)). 90 tablet 0   . potassium chloride (K-DUR) 10 MEQ tablet TAKE K-DUR 2 TABLETS 10 MEQ DAILY (Patient taking differently: Take  20 mEq by mouth daily. ) 60 tablet 11   . prazosin (MINIPRESS) 2 MG capsule Take 4 mg by mouth at bedtime.      [name] Propylene Glycol (SYSTANE COMPLETE) 0.6 % SOLN Place 1-2 drops into both eyes 4 (four) times daily as needed (dry eyes).      . rivaroxaban (XARELTO) 20 MG TABS tablet TAKE 1 TABLET DAILY BEFORE BEDTIME (Patient taking differently: Take 20 mg by mouth at bedtime. TAKE 1 TABLET DAILY BEFORE BEDTIME) 90 tablet 1   . Secukinumab (COSENTYX SENSOREADY PEN) 150 MG/ML SOAJ Inject 300 mg into the skin every 28 (twenty-eight) days.      [name] testosterone cypionate (DEPOTESTOSTERONE CYPIONATE) 200 MG/ML injection INJECT 0.5 MLS (100 MG TOTAL) INTO THE MUSCLE ONCE A WEEK. 10 mL 0   . ursodiol (ACTIGALL) 250 MG tablet Take 250 mg by mouth 2 (two) times daily with a meal.      . ursodiol (ACTIGALL) 500 MG tablet Take 500 mg by mouth 2 (two) times daily with a meal.      . venlafaxine XR (EFFEXOR-XR) 150 MG 24 hr capsule Take 2 capsules (300 mg total) by mouth daily with breakfast. 60 capsule 5   . NEEDLE, DISP, 18 G 18G X 1-1/2" MISC 1 Units by Does not apply route once a week. 50 each 2   . NEEDLE, DISP, 23 G 23G X 1" MISC Hypodermic Needles 23 gauge x 1"  USE ONCE A WEEK     . NEEDLE, DISP, 23 G 23G X 3/4" MISC 1 Units by Does not apply route once a week. 100 each 1   . Syringe, Disposable, 1 ML MISC 1 Units by Does not apply route once a week. 60 each 2   . SYRINGE-NEEDLE, DISP, 3 ML (B-D 3CC LUER-LOK SYR 18GX1-1/2) 18G X 1-1/2" 3 ML MISC BD Luer-Lok Syringe 3 mL 18 x 1 1/2"  USE ONCE A WEEK      Allergies  Allergen Reactions  . Penicillin G Anaphylaxis and Other (See Comments)    Has patient had a PCN reaction causing immediate rash, facial/tongue/throat swelling, SOB or lightheadedness with hypotension: Yes Has patient had a PCN reaction causing severe  rash involving mucus membranes or skin necrosis: No Has patient had a PCN reaction that required hospitalization Yes Has patient had a PCN reaction occurring within the last 10 years: No If all of the above answers are "NO", then may proceed with Cephalosporin use.   . Sulfa Antibiotics Rash    Stevens-Johnson rash  . Tegaderm Chg Dressing  [Chlorhexidine] Swelling and Itching  . Vancomycin Rash and Other (See Comments)    RED MAN SYNDROME CAN HAVE IF GIVEN OVER 2HOURS  . Duloxetine Other (See Comments)    Restless legs  . Gabapentin Nausea Only and Other (See Comments)    Other reaction(s): nausea, mental status Drowsiness and restlessness.  . Tegaderm Ag Mesh [Silver] Dermatitis    Causes blistering wounds   . Citalopram Other (See Comments)    Dystonia  . Wheat Bran     Due to Celiac  . Ibuprofen Other (See Comments)    Contraindicated with Xarelto.   . Nortriptyline Other (See Comments)    Dry mouth at 25 mg dose.  Tolerates 10 mg dose  . Pregabalin Other (See Comments)    Ineffective  . Sulfacetamide Sodium-Sulfur Rash    Social History   Tobacco Use  . Smoking status: Never Smoker  . Smokeless tobacco: Never Used  Substance Use Topics  . Alcohol use: Yes    Frequency: Never    Comment: social    Family History  Problem Relation Age of Onset  . Stroke Maternal Grandfather        46  . Heart attack Maternal Grandfather   . Hypertension Mother   . Psoriasis Mother   . Other Mother        meningioma developed ~2019  . Cancer Paternal Grandfather   . Heart attack Paternal Grandfather   . Stroke Paternal Uncle        age 26  . Polycythemia Paternal Uncle   . Stroke Maternal Grandmother   . Congestive Heart Failure Maternal Grandmother   . Heart attack Maternal Grandmother   . Protein C deficiency Sister 77       Miscarriages     Review of Systems  Constitutional: Positive for malaise/fatigue. Negative for chills, diaphoresis, fever and weight loss.   HENT: Negative.   Eyes: Negative.   Respiratory: Negative.   Cardiovascular: Negative.   Gastrointestinal: Negative.   Genitourinary: Negative.   Musculoskeletal: Positive for back pain, joint pain, myalgias and neck pain. Negative for falls.  Skin: Negative.   Neurological: Positive for tingling and sensory change. Negative for dizziness, tremors, focal weakness, seizures, loss of consciousness, weakness and headaches.  Endo/Heme/Allergies: Negative.   Psychiatric/Behavioral: Negative.     Objective:  Physical Exam  Constitutional: He is oriented to person, place, and time. He appears well-developed and well-nourished. No distress.  HENT:  Head: Normocephalic and atraumatic.  Right Ear: External ear normal.  Left Ear: External ear normal.  Nose: Nose normal.  Eyes: Conjunctivae and EOM are normal.  Neck: Normal range of motion. Neck supple.  Cardiovascular: Regular rhythm, normal heart sounds and intact distal pulses. Tachycardia present.  No murmur heard. Respiratory: Effort normal and breath sounds normal. No respiratory distress. He has no wheezes.  GI: Soft. Bowel sounds are normal. He exhibits no distension. There is no abdominal tenderness.  Musculoskeletal:     Right hip: Normal.     Left hip: Normal.     Comments: Right Knee Exam: No effusion. No Swelling. Range of motion is 5-130 degrees. Significant crepitus on range of motion of the knee. No medial joint line tenderness Positive lateral joint line tenderness. Stable knee. Left knee incision healed with no signs of infection   Neurological: He is alert and oriented to person, place, and time. He has normal strength. No sensory deficit.  Skin: No rash noted. He is not diaphoretic. No erythema.  Psychiatric: He has a normal mood and affect. His behavior is normal.    Vital signs in last 24 hours: Pulse Rate:  [107] 107 (06/17 1340) Resp:  [16] 16 (06/17 1340) BP: (130)/(93) 130/93 (06/17 1340) SpO2:  [98 %]  98 % (06/17 1340)  Labs:   Estimated body mass index is 32.46 kg/m as calculated from the following:   Height as of 08/18/18: 5' 9"  (1.753 m).   Weight as of 07/09/18: 99.7 kg.   Imaging Review Plain radiographs demonstrate moderate degenerative joint disease of the right knee(s). The overall alignment ismild valgus. The bone quality appears to be good for age and reported activity level.    Assessment/Plan:  End stage primary osteoarthritis, right knee   The patient history, physical examination, clinical judgment of the provider and imaging studies are consistent with end stage degenerative joint disease of the right knee(s) and total knee arthroplasty is deemed medically necessary.  The treatment options including medical management, injection therapy arthroscopy and arthroplasty were discussed at length. The risks and benefits of total knee arthroplasty were presented and reviewed. The risks due to aseptic loosening, infection, stiffness, patella tracking problems, thromboembolic complications and other imponderables were discussed. The patient acknowledged the explanation, agreed to proceed with the plan and consent was signed. Patient is being admitted for inpatient treatment for surgery, pain control, PT, OT, prophylactic antibiotics, VTE prophylaxis, progressive ambulation and ADL's and discharge planning. The patient is planning to be discharged home with outpatient therapy.    Anticipated LOS equal to or greater than 2 midnights due to - Age 35 and older with one or more of the following:  - Obesity  - Expected need for hospital services (PT, OT, Nursing) required for safe  discharge  - Anticipated need for postoperative skilled nursing care or inpatient rehab  - Active co-morbidities: Chronic pain requiring opiods and Stroke OR   - Unanticipated findings during/Post Surgery: None  - Patient is a high risk of re-admission due to: None    Risks and benefits of the surgery  were discussed with the patient and Dr. Wynelle Link at their previous office visit, and the patient has elected to move forward with the aforementioned surgery. Post-operative care plans were discussed with the patient today and all patient questions were answered.  Therapy Plans: outpatient therapy at [facility] Disposition: Home with wife Planned DVT Prophylaxis: Xarelto 20 mg daily DME needed: None PCP: [name], MD Hematologist: [name], MD TXA: Topical Allergies: PCN (anaphylaxis), vancomycin (red man syndrome), sulfa Anesthesia Concerns: Nausea/vomiting BMI: 34.4  Other: Patient is requesting a dietary consult for celiac disease, a platform walker, and a hospital bed upon discharge at time of surgery.  Instructed patient on which medications to discontinue 5 days prior to surgery. Will follow-up in office with Dr. Wynelle Link 2 weeks post-op.  Ardeen Jourdain, PA-C

## 2018-08-20 ENCOUNTER — Telehealth: Payer: Self-pay | Admitting: Physical Medicine & Rehabilitation

## 2018-08-20 ENCOUNTER — Telehealth: Payer: Self-pay

## 2018-08-20 NOTE — Progress Notes (Signed)
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 FGaynelle Arabian   Pt last seen by cardiology 05/07/18. Seen by HAlmyra Deforest PA-C.  Per OV note, "Overall he is quite stable from cardiology perspective to proceed with surgery without further work-up."   Pt is followed by hematologist, Dr. YTruitt Merle  Last seen 03/11/18.  Per clearance received from Dr. FBurr Medicoon 03/22/18 pt is to hold Xarelto 2-3 days prior to surgery (on chart).   Pt can proceed with planned procedure barring acute status change.  VS: BP (!) 130/93   Pulse (!) 107   Resp 16   Ht 5' 9"  (1.753 m)   SpO2 98%   BMI 32.46 kg/m   PROVIDERS: YConcepcion Elk MD is PCP   CSanda Klein MD is cardiologist   FTruitt Merle MD is hematologist  LABS: Labs reviewed: Acceptable for surgery. (all labs ordered are listed, but only abnormal results are displayed)  Labs Reviewed  CBC - Abnormal; Notable for the following components:      Result Value   MCV 79.2 (*)    MCH 24.1 (*)    RDW 15.9 (*)    All other components within normal limits  COMPREHENSIVE METABOLIC PANEL - Abnormal; Notable for the following components:   Glucose, Bld 115 (*)    Alkaline Phosphatase 169 (*)    All other components within normal limits  SURGICAL PCR SCREEN  APTT  PROTIME-INR  TYPE AND SCREEN     IMAGES: Chest Xray 05/11/2018 FINDINGS: The heart size and mediastinal contours are within normal limits. Both lungs are clear. The  visualized skeletal structures are unremarkable.  IMPRESSION: Normal exam.  EKG: 05/07/2018 Rate 83 bpm Normal sinus rhythm  Rightward axis Abnormal QRS-T angle, consider primary T wave abnormality   CV: Echo 12/15/16 Study Conclusions  - Left ventricle: The cavity size was normal. Wall thickness was   increased in a pattern of mild LVH. Systolic function was normal.   The estimated ejection fraction was in the range of 60% to 65%.   Wall motion was normal; there were no regional wall motion   abnormalities. Left ventricular diastolic function parameters   were normal. - Mitral valve: There was mild regurgitation. - Left atrium: The atrium was mildly dilated. - Atrial septum: No defect or patent foramen ovale was identified. Past Medical History:  Diagnosis Date  . Abnormal weight loss   . Anxiety   . Arthritis   . Celiac disease   . Cervical neck pain with evidence of disc disease    patient has a cyst   . Chronic constipation   . Chronic diastolic heart failure ([place]    Pt. denies  . Chronic pain   . Degenerative disc disease at L5-S1 level    with stenosis  . DVT (deep venous thrombosis) ([facility])    Right upper arm, bilateral leg  . Eczema    inguinal, feet  . Elevated liver enzymes   .  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 Family history of adverse reaction to anesthesia    family has problems with anesthesia of nausea and vomiting   . 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 GERD (gastroesophageal reflux disease)    History of in 20's  . Gluten enteropathy   . H/O parotitis    right   . Hard of hearing   . History of kidney stones   . History of retinal tear    Bilateral  . History of staph infection    required wound vac  . Hx-TIA (transient ischemic attack)    2015  . LVH (left ventricular hypertrophy) 12/15/2016   Mild, noted on ECHO  . MVP (mitral valve prolapse)   . NAFL (nonalcoholic fatty liver)   . Neck pain   . Neuromuscular  disorder ([place])    bilateral neuropathy feet.  . Pneumonia 12/17/2010  . Polycythemia   . Polycythemia, secondary   . PONV (postoperative nausea and vomiting)   . Protein C deficiency ([place])    Dr. Anne Fu  . Psoriasis    16 X10 cm psoriatic rash on sole of left foot ; open and occ scant bleeding;   . psoriatic arthritis   . PTSD (post-traumatic stress disorder)   . Scaphoid fracture of wrist 09/23/2013  . Seizure ([place])    childhood, medication until age 27 then weanned completely off  . Sleep apnea    split night study last done by Dr. [name] 06/18/15 shows severe OSA, CSA, and hypersomnia, rec bipap  . Splenomegaly   . Stenosis of ureteropelvic junction (UPJ)    left  . Stroke [facility])    CVA vs TIA in left cerebrum causing slight right sided weakness-Dr. [name] follows  . 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 Tachycardia    hx of   . Transfusion history    past history- none recent, after surgeries due to blood loss  . Wears glasses   . Wears hearing aid     Past Surgical History:  Procedure Laterality Date  . ABDOMINAL HYSTERECTOMY Bilateral 1994   TAH, BSO- tranverse incision at 53 yo  . ANKLE ARTHROSCOPY WITH RECONSTRUCTION Right 2007  . CHOLECYSTECTOMY     laparoscopic  . COLONOSCOPY     x3  . EYE SURGERY     Left eye 03/02/2018, right 02/15/2018  . HIP ARTHROSCOPY W/ LABRAL REPAIR Right 05/11/2013   acetabular labral tear 03/30/2013  . KNEE ARTHROPLASTY Right   . KNEE JOINT MANIPULATION Left    x3 under anesthesia  . KNEE SURGERY Bilateral 1984   Right ACL, left PCL repair  . LITHOTRIPSY  2005  . LIVER BIOPSY  2013   normal results.  [name] MASTECTOMY Bilateral    prior to 2009  . MOUTH SURGERY    . NASAL SEPTUM SURGERY N/A 09/20/2015   by ENT Dr. [name]  . OVARIAN CYST SURGERY Left    size of grapefruit, was informed that she had shortened vagina  . SHOULDER SURGERY Bilateral    Right 08/15/2016, Left 11/15/2016  . THUMB ARTHROSCOPY Left    . THYROIDECTOMY, PARTIAL Left 2008  . TOTAL KNEE REVISION Left 02/06/2016   Procedure: LEFT TOTAL KNEE REVISION;  Surgeon: [name], MD;  Location: [facility];  Service: Orthopedics;  Laterality: Left;  . TOTAL KNEE REVISION Left 04/22/2017   Procedure: Left knee polyethylene revision;  Surgeon: [name], MD;  Location: [facility];  Service: Orthopedics;  Laterality: Left;  . UPPER GI ENDOSCOPY  2003    MEDICATIONS: . acyclovir ointment (ZOVIRAX) 5 %  . antiseptic oral rinse (BIOTENE) LIQD  . augmented betamethasone dipropionate (DIPROLENE-AF) 0.05 % ointment  . baclofen (LIORESAL) 20 MG tablet  . Calcipotriene-Betameth Diprop (ENSTILAR) 0.005-0.064 % FOAM  . clobetasol (TEMOVATE) 0.05 % GEL  . clorazepate (TRANXENE) 15 MG tablet  . clorazepate (TRANXENE) 7.5 MG tablet  . desonide (DESOWEN) 0.05 % ointment  . diclofenac sodium (VOLTAREN) 1 % GEL  . Emollient (CERAVE) CREA  . Fluocinolone Acetonide 0.01 % OIL  . furosemide (LASIX) 20 MG tablet  . furosemide (LASIX) 40 MG tablet  . mirabegron ER (MYRBETRIQ) 25 MG TB24 tablet  . morphine (KADIAN) 60 MG 24 hr capsule  . NEEDLE, DISP, 18 G 18G X 1-1/2" MISC  . NEEDLE, DISP, 23 G 23G X 1" MISC  . NEEDLE, DISP, 23 G 23G X 3/4" MISC  . neomycin-polymyxin-hydrocortisone (CORTISPORIN) 3.5-10000-1 OTIC suspension  . NONFORMULARY OR COMPOUNDED ITEM  . Oxycodone HCl 10 MG TABS  . potassium chloride (K-DUR) 10 MEQ tablet  . prazosin (MINIPRESS) 2 MG capsule  . Propylene Glycol (SYSTANE COMPLETE) 0.6 % SOLN  . rivaroxaban (XARELTO) 20 MG TABS tablet  . Secukinumab (COSENTYX SENSOREADY PEN) 150 MG/ML SOAJ  . Syringe, Disposable, 1 ML MISC  . SYRINGE-NEEDLE, DISP, 3 ML (B-D 3CC LUER-LOK SYR 18GX1-1/2) 18G X 1-1/2" 3 ML MISC  . testosterone cypionate (DEPOTESTOSTERONE CYPIONATE) 200 MG/ML injection  . ursodiol (ACTIGALL) 250 MG tablet  . ursodiol (ACTIGALL) 500 MG tablet  . venlafaxine XR (EFFEXOR-XR) 150 MG 24 hr capsule   No current  facility-administered medications for this encounter.     Maia Plan [facility] Pre-Surgical Testing [phone] 08/20/18 2:14 PM

## 2018-08-20 NOTE — Telephone Encounter (Signed)
Mostly in regards to his morphine since oxycodone will be managed by ortho

## 2018-08-20 NOTE — Telephone Encounter (Signed)
Can he see Zella Ball pre-op? That would be my preference

## 2018-08-20 NOTE — Telephone Encounter (Signed)
error 

## 2018-08-20 NOTE — Telephone Encounter (Signed)
Mr Selvidge is having knee revision/replacement on 08/23/18 and his appointment is with Dr [name] on 09/01/18. He is asking what he should do about his medication since he will be post op at the time of the appointment.

## 2018-08-20 NOTE — Anesthesia Preprocedure Evaluation (Addendum)
Anesthesia Evaluation  Patient identified by MRN, date of birth, ID band Patient awake    Reviewed: Allergy & Precautions, NPO status , Patient's Chart, lab work & pertinent test results  History of Anesthesia Complications (+) PONV  Airway Mallampati: II  TM Distance: >3 FB Neck ROM: Full    Dental no notable dental hx.    Pulmonary sleep apnea and Continuous Positive Airway Pressure Ventilation ,    Pulmonary exam normal breath sounds clear to auscultation       Cardiovascular + DVT  Normal cardiovascular exam Rhythm:Regular Rate:Normal  ECG: NSR, rate 83   Neuro/Psych  Headaches, Seizures -, Well Controlled,  PSYCHIATRIC DISORDERS Anxiety Depression bilateral neuropathy feet. TIA Neuromuscular disease CVA, Residual Symptoms    GI/Hepatic negative GI ROS, (+)     substance abuse  , Gluten enteropathy   Endo/Other  negative endocrine ROS  Renal/GU negative Renal ROS     Musculoskeletal  (+) Arthritis , narcotic dependentNeck pain Degenerative disc disease at L5-S1 level   Abdominal (+) + obese,   Peds  Hematology negative hematology ROS (+)   Anesthesia Other Findings Right knee osteoarthritis  Reproductive/Obstetrics                           Anesthesia Physical Anesthesia Plan  ASA: III  Anesthesia Plan: Regional and Spinal   Post-op Pain Management:    Induction: Intravenous  PONV Risk Score and Plan: 2 and Ondansetron, Dexamethasone, Midazolam, Treatment may vary due to age or medical condition and Scopolamine patch - Pre-op  Airway Management Planned: Simple Face Mask  Additional Equipment:   Intra-op Plan:   Post-operative Plan:   Informed Consent: I have reviewed the patients History and Physical, chart, labs and discussed the procedure including the risks, benefits and alternatives for the proposed anesthesia with the patient or authorized representative who has  indicated his/her understanding and acceptance.     Dental advisory given  Plan Discussed with: CRNA  Anesthesia Plan Comments: (Reviewed PAT note 08/18/2018, Konrad Felix, PA-C)       Anesthesia Quick Evaluation

## 2018-08-20 NOTE — Telephone Encounter (Signed)
Patient having knee surgery and would like to know what he should do about his appointment and his medications.  Please call patient.

## 2018-08-22 ENCOUNTER — Encounter: Payer: Self-pay | Admitting: Hematology

## 2018-08-22 MED ORDER — BUPIVACAINE LIPOSOME 1.3 % IJ SUSP
20.0000 mL | Freq: Once | INTRAMUSCULAR | Status: DC
  Filled 2018-08-22: qty 20

## 2018-08-23 ENCOUNTER — Inpatient Hospital Stay (HOSPITAL_COMMUNITY): Payer: Medicare Other | Admitting: Physician Assistant

## 2018-08-23 ENCOUNTER — Encounter (HOSPITAL_COMMUNITY): Payer: Self-pay | Admitting: Emergency Medicine

## 2018-08-23 ENCOUNTER — Inpatient Hospital Stay (HOSPITAL_COMMUNITY): Payer: Medicare Other | Admitting: Certified Registered"

## 2018-08-23 ENCOUNTER — Inpatient Hospital Stay (HOSPITAL_COMMUNITY)
Admission: RE | Admit: 2018-08-23 | Discharge: 2018-08-26 | DRG: 470 | Disposition: A | Discharge status: Home Health | Payer: Medicare Other | Attending: Orthopedic Surgery | Admitting: Orthopedic Surgery

## 2018-08-23 ENCOUNTER — Encounter (HOSPITAL_COMMUNITY)
Admission: RE | Disposition: A | Discharge status: Home Health | Payer: Self-pay | Source: Home / Self Care | Attending: Orthopedic Surgery

## 2018-08-23 ENCOUNTER — Other Ambulatory Visit: Payer: Self-pay

## 2018-08-23 DIAGNOSIS — Z9013 Acquired absence of bilateral breasts and nipples: Secondary | ICD-10-CM

## 2018-08-23 DIAGNOSIS — K219 Gastro-esophageal reflux disease without esophagitis: Secondary | ICD-10-CM | POA: Diagnosis present

## 2018-08-23 DIAGNOSIS — Z7901 Long term (current) use of anticoagulants: Secondary | ICD-10-CM

## 2018-08-23 DIAGNOSIS — M179 Osteoarthritis of knee, unspecified: Secondary | ICD-10-CM

## 2018-08-23 DIAGNOSIS — I69351 Hemiplegia and hemiparesis following cerebral infarction affecting right dominant side: Secondary | ICD-10-CM

## 2018-08-23 DIAGNOSIS — I5032 Chronic diastolic (congestive) heart failure: Secondary | ICD-10-CM | POA: Diagnosis present

## 2018-08-23 DIAGNOSIS — F4310 Post-traumatic stress disorder, unspecified: Secondary | ICD-10-CM | POA: Diagnosis present

## 2018-08-23 DIAGNOSIS — R5382 Chronic fatigue, unspecified: Secondary | ICD-10-CM | POA: Diagnosis present

## 2018-08-23 DIAGNOSIS — F649 Gender identity disorder, unspecified: Secondary | ICD-10-CM | POA: Diagnosis present

## 2018-08-23 DIAGNOSIS — Z86718 Personal history of other venous thrombosis and embolism: Secondary | ICD-10-CM

## 2018-08-23 DIAGNOSIS — E669 Obesity, unspecified: Secondary | ICD-10-CM | POA: Diagnosis present

## 2018-08-23 DIAGNOSIS — Z79899 Other long term (current) drug therapy: Secondary | ICD-10-CM

## 2018-08-23 DIAGNOSIS — G894 Chronic pain syndrome: Secondary | ICD-10-CM | POA: Diagnosis present

## 2018-08-23 DIAGNOSIS — K760 Fatty (change of) liver, not elsewhere classified: Secondary | ICD-10-CM | POA: Diagnosis present

## 2018-08-23 DIAGNOSIS — K900 Celiac disease: Secondary | ICD-10-CM | POA: Diagnosis present

## 2018-08-23 DIAGNOSIS — M1710 Unilateral primary osteoarthritis, unspecified knee: Secondary | ICD-10-CM

## 2018-08-23 DIAGNOSIS — M1711 Unilateral primary osteoarthritis, right knee: Principal | ICD-10-CM | POA: Diagnosis present

## 2018-08-23 DIAGNOSIS — Z6832 Body mass index (BMI) 32.0-32.9, adult: Secondary | ICD-10-CM

## 2018-08-23 HISTORY — PX: TOTAL KNEE ARTHROPLASTY: SHX125

## 2018-08-23 LAB — TYPE AND SCREEN
ABO/RH(D): A POS
Antibody Screen: NEGATIVE

## 2018-08-23 SURGERY — ARTHROPLASTY, KNEE, TOTAL
Anesthesia: Regional | Site: Knee | Laterality: Right

## 2018-08-23 MED ORDER — POVIDONE-IODINE 10 % EX SWAB
2.0000 "application " | Freq: Once | CUTANEOUS | Status: AC
  Administered 2018-08-23: 2 via TOPICAL

## 2018-08-23 MED ORDER — POLYVINYL ALCOHOL 1.4 % OP SOLN: 1.0000 [drp] | OPHTHALMIC | Status: DC | PRN

## 2018-08-23 MED ORDER — OXYCODONE HCL 5 MG PO TABS
ORAL_TABLET | ORAL | Status: AC
  Filled 2018-08-23: qty 1

## 2018-08-23 MED ORDER — OXYCODONE HCL 5 MG PO TABS
5.0000 mg | ORAL_TABLET | ORAL | Status: DC | PRN
  Administered 2018-08-25: 5 mg via ORAL
  Filled 2018-08-23 (×2): qty 2
  Filled 2018-08-23: qty 1

## 2018-08-23 MED ORDER — CETAPHIL MOISTURIZING EX LOTN
TOPICAL_LOTION | Freq: Two times a day (BID) | CUTANEOUS | Status: DC
  Administered 2018-08-23 – 2018-08-24 (×2): via TOPICAL
  Administered 2018-08-24: 1 via TOPICAL
  Filled 2018-08-23: qty 473

## 2018-08-23 MED ORDER — ONDANSETRON HCL 4 MG PO TABS: 4.0000 mg | ORAL_TABLET | Freq: Four times a day (QID) | ORAL | Status: DC | PRN

## 2018-08-23 MED ORDER — METHOCARBAMOL 500 MG IVPB - SIMPLE MED
INTRAVENOUS | Status: AC
  Administered 2018-08-23: 500 mg
  Filled 2018-08-23: qty 50

## 2018-08-23 MED ORDER — CLOBETASOL PROPIONATE 0.05 % EX OINT: 1.0000 "application " | TOPICAL_OINTMENT | Freq: Two times a day (BID) | CUTANEOUS | Status: DC | PRN

## 2018-08-23 MED ORDER — METHOCARBAMOL 500 MG PO TABS
500.0000 mg | ORAL_TABLET | Freq: Four times a day (QID) | ORAL | Status: DC | PRN
  Administered 2018-08-24 – 2018-08-26 (×6): 500 mg via ORAL
  Filled 2018-08-23 (×6): qty 1

## 2018-08-23 MED ORDER — FENTANYL CITRATE (PF) 100 MCG/2ML IJ SOLN
INTRAMUSCULAR | Status: AC
  Filled 2018-08-23: qty 4

## 2018-08-23 MED ORDER — CHLORHEXIDINE GLUCONATE 4 % EX LIQD: 60.0000 mL | Freq: Once | CUTANEOUS | Status: DC

## 2018-08-23 MED ORDER — CLORAZEPATE DIPOTASSIUM 7.5 MG PO TABS
22.5000 mg | ORAL_TABLET | Freq: Every day | ORAL | Status: DC
  Administered 2018-08-23 – 2018-08-25 (×3): 22.5 mg via ORAL
  Filled 2018-08-23 (×3): qty 3

## 2018-08-23 MED ORDER — STERILE WATER FOR IRRIGATION IR SOLN
Status: DC | PRN
  Administered 2018-08-23: 1000 mL

## 2018-08-23 MED ORDER — BISACODYL 10 MG RE SUPP: 10.0000 mg | Freq: Every day | RECTAL | Status: DC | PRN

## 2018-08-23 MED ORDER — MORPHINE SULFATE ER 30 MG PO TBCR: 30.0000 mg | EXTENDED_RELEASE_TABLET | Freq: Two times a day (BID) | ORAL | Status: DC

## 2018-08-23 MED ORDER — FLEET ENEMA 7-19 GM/118ML RE ENEM: 1.0000 | ENEMA | Freq: Once | RECTAL | Status: DC | PRN

## 2018-08-23 MED ORDER — DEXAMETHASONE SODIUM PHOSPHATE 10 MG/ML IJ SOLN
INTRAMUSCULAR | Status: AC
  Filled 2018-08-23: qty 1

## 2018-08-23 MED ORDER — GABAPENTIN 300 MG PO CAPS
300.0000 mg | ORAL_CAPSULE | Freq: Three times a day (TID) | ORAL | Status: DC
  Filled 2018-08-23 (×3): qty 1

## 2018-08-23 MED ORDER — OXYCODONE HCL 5 MG PO TABS
5.0000 mg | ORAL_TABLET | Freq: Once | ORAL | Status: AC | PRN
  Administered 2018-08-23: 5 mg via ORAL

## 2018-08-23 MED ORDER — TRIAMCINOLONE ACETONIDE 0.5 % EX CREA
TOPICAL_CREAM | Freq: Two times a day (BID) | CUTANEOUS | Status: DC
  Administered 2018-08-23: 22:00:00 via TOPICAL
  Administered 2018-08-24: 1 via TOPICAL
  Administered 2018-08-24: 23:00:00 via TOPICAL
  Filled 2018-08-23: qty 15

## 2018-08-23 MED ORDER — CLINDAMYCIN PHOSPHATE 600 MG/50ML IV SOLN
600.0000 mg | Freq: Four times a day (QID) | INTRAVENOUS | Status: AC
  Administered 2018-08-23 – 2018-08-24 (×2): 600 mg via INTRAVENOUS
  Filled 2018-08-23 (×2): qty 50

## 2018-08-23 MED ORDER — MIRABEGRON ER 25 MG PO TB24
25.0000 mg | ORAL_TABLET | Freq: Every day | ORAL | Status: DC
  Administered 2018-08-24 – 2018-08-26 (×3): 25 mg via ORAL
  Filled 2018-08-23 (×3): qty 1

## 2018-08-23 MED ORDER — PRAZOSIN HCL 1 MG PO CAPS
4.0000 mg | ORAL_CAPSULE | Freq: Every day | ORAL | Status: DC
  Administered 2018-08-23 – 2018-08-24 (×2): 4 mg via ORAL
  Filled 2018-08-23 (×4): qty 4

## 2018-08-23 MED ORDER — CALCIPOTRIENE-BETAMETH DIPROP 0.005-0.064 % EX FOAM: 1.0000 "application " | Freq: Two times a day (BID) | CUTANEOUS | Status: DC

## 2018-08-23 MED ORDER — DIPHENHYDRAMINE HCL 12.5 MG/5ML PO ELIX: 12.5000 mg | ORAL_SOLUTION | ORAL | Status: DC | PRN

## 2018-08-23 MED ORDER — CLORAZEPATE DIPOTASSIUM 7.5 MG PO TABS
7.5000 mg | ORAL_TABLET | Freq: Every day | ORAL | Status: DC
  Administered 2018-08-24 – 2018-08-26 (×3): 7.5 mg via ORAL
  Filled 2018-08-23 (×3): qty 1

## 2018-08-23 MED ORDER — URSODIOL 500 MG PO TABS: 500.0000 mg | ORAL_TABLET | Freq: Two times a day (BID) | ORAL | Status: DC

## 2018-08-23 MED ORDER — FENTANYL CITRATE (PF) 100 MCG/2ML IJ SOLN
50.0000 ug | INTRAMUSCULAR | Status: DC
  Filled 2018-08-23: qty 2

## 2018-08-23 MED ORDER — SODIUM CHLORIDE 0.9 % IR SOLN
Status: DC | PRN
  Administered 2018-08-23: 1000 mL

## 2018-08-23 MED ORDER — METHOCARBAMOL 500 MG IVPB - SIMPLE MED
500.0000 mg | Freq: Four times a day (QID) | INTRAVENOUS | Status: DC | PRN
  Filled 2018-08-23: qty 50

## 2018-08-23 MED ORDER — SODIUM CHLORIDE (PF) 0.9 % IJ SOLN
INTRAMUSCULAR | Status: AC
  Filled 2018-08-23: qty 10

## 2018-08-23 MED ORDER — HYDROMORPHONE HCL 1 MG/ML IJ SOLN
INTRAMUSCULAR | Status: AC
  Filled 2018-08-23: qty 1

## 2018-08-23 MED ORDER — HYDROMORPHONE HCL 1 MG/ML IJ SOLN
0.2500 mg | INTRAMUSCULAR | Status: DC | PRN
  Administered 2018-08-23 (×4): 0.5 mg via INTRAVENOUS

## 2018-08-23 MED ORDER — PROPOFOL 10 MG/ML IV BOLUS
INTRAVENOUS | Status: AC
  Filled 2018-08-23: qty 20

## 2018-08-23 MED ORDER — BACLOFEN 20 MG PO TABS
20.0000 mg | ORAL_TABLET | Freq: Four times a day (QID) | ORAL | Status: DC
  Administered 2018-08-23 – 2018-08-26 (×11): 20 mg via ORAL
  Filled 2018-08-23 (×11): qty 1

## 2018-08-23 MED ORDER — ROPIVACAINE HCL 5 MG/ML IJ SOLN
INTRAMUSCULAR | Status: DC | PRN
  Administered 2018-08-23: 30 mL via PERINEURAL

## 2018-08-23 MED ORDER — MORPHINE SULFATE (PF) 4 MG/ML IV SOLN
1.0000 mg | INTRAVENOUS | Status: DC | PRN
  Administered 2018-08-24: 1 mg via INTRAVENOUS
  Administered 2018-08-24 (×3): 2 mg via INTRAVENOUS
  Filled 2018-08-23 (×4): qty 1

## 2018-08-23 MED ORDER — OXYCODONE HCL 5 MG PO TABS
10.0000 mg | ORAL_TABLET | ORAL | Status: DC | PRN
  Administered 2018-08-23: 10 mg via ORAL
  Administered 2018-08-24 (×2): 15 mg via ORAL
  Administered 2018-08-24: 10 mg via ORAL
  Administered 2018-08-24 – 2018-08-26 (×11): 15 mg via ORAL
  Filled 2018-08-23 (×9): qty 3
  Filled 2018-08-23: qty 2
  Filled 2018-08-23 (×4): qty 3

## 2018-08-23 MED ORDER — HYDROCORTISONE 1 % EX CREA
1.0000 "application " | TOPICAL_CREAM | Freq: Two times a day (BID) | CUTANEOUS | Status: DC
  Administered 2018-08-23 – 2018-08-24 (×3): 1 via TOPICAL
  Filled 2018-08-23: qty 28

## 2018-08-23 MED ORDER — DEXAMETHASONE SODIUM PHOSPHATE 10 MG/ML IJ SOLN: 10.0000 mg | Freq: Once | INTRAMUSCULAR | Status: DC

## 2018-08-23 MED ORDER — URSODIOL 300 MG PO CAPS
600.0000 mg | ORAL_CAPSULE | Freq: Two times a day (BID) | ORAL | Status: DC
  Administered 2018-08-24: 600 mg via ORAL
  Filled 2018-08-23: qty 2

## 2018-08-23 MED ORDER — MORPHINE SULFATE ER 30 MG PO TBCR
60.0000 mg | EXTENDED_RELEASE_TABLET | Freq: Two times a day (BID) | ORAL | Status: DC
  Administered 2018-08-23 – 2018-08-26 (×6): 60 mg via ORAL
  Filled 2018-08-23 (×6): qty 2

## 2018-08-23 MED ORDER — CLINDAMYCIN PHOSPHATE 900 MG/50ML IV SOLN
900.0000 mg | INTRAVENOUS | Status: AC
  Administered 2018-08-23: 900 mg via INTRAVENOUS
  Filled 2018-08-23: qty 50

## 2018-08-23 MED ORDER — TRANEXAMIC ACID 1000 MG/10ML IV SOLN
2000.0000 mg | Freq: Once | INTRAVENOUS | Status: DC
  Filled 2018-08-23: qty 20

## 2018-08-23 MED ORDER — SODIUM CHLORIDE 0.9 % IV SOLN
INTRAVENOUS | Status: DC
  Administered 2018-08-23: 19:00:00 via INTRAVENOUS

## 2018-08-23 MED ORDER — PROPOFOL 500 MG/50ML IV EMUL
INTRAVENOUS | Status: DC | PRN
  Administered 2018-08-23: 125 ug/kg/min via INTRAVENOUS

## 2018-08-23 MED ORDER — MENTHOL 3 MG MT LOZG: 1.0000 | LOZENGE | OROMUCOSAL | Status: DC | PRN

## 2018-08-23 MED ORDER — SODIUM CHLORIDE (PF) 0.9 % IJ SOLN
INTRAMUSCULAR | Status: AC
  Filled 2018-08-23: qty 50

## 2018-08-23 MED ORDER — TRIAMCINOLONE ACETONIDE 0.5 % EX OINT
TOPICAL_OINTMENT | Freq: Two times a day (BID) | CUTANEOUS | Status: DC
  Filled 2018-08-23: qty 15

## 2018-08-23 MED ORDER — SODIUM CHLORIDE (PF) 0.9 % IJ SOLN
INTRAMUSCULAR | Status: DC | PRN
  Administered 2018-08-23: 60 mL

## 2018-08-23 MED ORDER — POTASSIUM CHLORIDE CRYS ER 20 MEQ PO TBCR
20.0000 meq | EXTENDED_RELEASE_TABLET | Freq: Every day | ORAL | Status: DC
  Filled 2018-08-23 (×3): qty 1

## 2018-08-23 MED ORDER — PROPYLENE GLYCOL 0.6 % OP SOLN: 1.0000 [drp] | Freq: Four times a day (QID) | OPHTHALMIC | Status: DC | PRN

## 2018-08-23 MED ORDER — VENLAFAXINE HCL ER 150 MG PO CP24
300.0000 mg | ORAL_CAPSULE | Freq: Every day | ORAL | Status: DC
  Administered 2018-08-24 – 2018-08-26 (×3): 300 mg via ORAL
  Filled 2018-08-23 (×3): qty 2

## 2018-08-23 MED ORDER — PROMETHAZINE HCL 25 MG/ML IJ SOLN: 6.2500 mg | INTRAMUSCULAR | Status: DC | PRN

## 2018-08-23 MED ORDER — 0.9 % SODIUM CHLORIDE (POUR BTL) OPTIME
TOPICAL | Status: DC | PRN
  Administered 2018-08-23: 12:00:00 1000 mL

## 2018-08-23 MED ORDER — BUPIVACAINE LIPOSOME 1.3 % IJ SUSP
INTRAMUSCULAR | Status: DC | PRN
  Administered 2018-08-23: 20 mL

## 2018-08-23 MED ORDER — ACETAMINOPHEN 500 MG PO TABS
1000.0000 mg | ORAL_TABLET | Freq: Four times a day (QID) | ORAL | Status: AC
  Administered 2018-08-23 – 2018-08-24 (×3): 1000 mg via ORAL
  Filled 2018-08-23 (×4): qty 2

## 2018-08-23 MED ORDER — PROPOFOL 10 MG/ML IV BOLUS
INTRAVENOUS | Status: DC | PRN
  Administered 2018-08-23 (×2): 10 mg via INTRAVENOUS

## 2018-08-23 MED ORDER — OXYCODONE HCL 5 MG/5ML PO SOLN: 5.0000 mg | Freq: Once | ORAL | Status: AC | PRN

## 2018-08-23 MED ORDER — SCOPOLAMINE 1 MG/3DAYS TD PT72
MEDICATED_PATCH | TRANSDERMAL | Status: AC
  Filled 2018-08-23: qty 1

## 2018-08-23 MED ORDER — DOCUSATE SODIUM 100 MG PO CAPS
100.0000 mg | ORAL_CAPSULE | Freq: Two times a day (BID) | ORAL | Status: DC
  Administered 2018-08-23 – 2018-08-26 (×6): 100 mg via ORAL
  Filled 2018-08-23 (×6): qty 1

## 2018-08-23 MED ORDER — MIDAZOLAM HCL 2 MG/2ML IJ SOLN
1.0000 mg | INTRAMUSCULAR | Status: DC
  Administered 2018-08-23: 2 mg via INTRAVENOUS
  Filled 2018-08-23: qty 2

## 2018-08-23 MED ORDER — NEOMYCIN-POLYMYXIN-HC 3.5-10000-1 OT SUSP: 4.0000 [drp] | Freq: Two times a day (BID) | OTIC | Status: DC | PRN

## 2018-08-23 MED ORDER — PHENOL 1.4 % MT LIQD: 1.0000 | OROMUCOSAL | Status: DC | PRN

## 2018-08-23 MED ORDER — ONDANSETRON HCL 4 MG/2ML IJ SOLN: 4.0000 mg | Freq: Four times a day (QID) | INTRAMUSCULAR | Status: DC | PRN

## 2018-08-23 MED ORDER — BUPIVACAINE IN DEXTROSE 0.75-8.25 % IT SOLN
INTRATHECAL | Status: DC | PRN
  Administered 2018-08-23: 1.6 mL via INTRATHECAL

## 2018-08-23 MED ORDER — ONDANSETRON HCL 4 MG/2ML IJ SOLN
INTRAMUSCULAR | Status: AC
  Filled 2018-08-23: qty 2

## 2018-08-23 MED ORDER — LACTATED RINGERS IV SOLN
INTRAVENOUS | Status: DC
  Administered 2018-08-23: 10:00:00 via INTRAVENOUS

## 2018-08-23 MED ORDER — CERAVE EX CREA: 1.0000 "application " | TOPICAL_CREAM | Freq: Two times a day (BID) | CUTANEOUS | Status: DC

## 2018-08-23 MED ORDER — METOCLOPRAMIDE HCL 5 MG/ML IJ SOLN: 5.0000 mg | Freq: Three times a day (TID) | INTRAMUSCULAR | Status: DC | PRN

## 2018-08-23 MED ORDER — METOCLOPRAMIDE HCL 5 MG PO TABS: 5.0000 mg | ORAL_TABLET | Freq: Three times a day (TID) | ORAL | Status: DC | PRN

## 2018-08-23 MED ORDER — ONDANSETRON HCL 4 MG/2ML IJ SOLN
INTRAMUSCULAR | Status: DC | PRN
  Administered 2018-08-23: 4 mg via INTRAVENOUS

## 2018-08-23 MED ORDER — PROPOFOL 10 MG/ML IV BOLUS
INTRAVENOUS | Status: AC
  Filled 2018-08-23: qty 80

## 2018-08-23 MED ORDER — DEXAMETHASONE SODIUM PHOSPHATE 10 MG/ML IJ SOLN
8.0000 mg | Freq: Once | INTRAMUSCULAR | Status: AC
  Administered 2018-08-23: 8 mg via INTRAVENOUS

## 2018-08-23 MED ORDER — BIOTENE DRY MOUTH MT LIQD: 15.0000 mL | OROMUCOSAL | Status: DC | PRN

## 2018-08-23 MED ORDER — RIVAROXABAN 10 MG PO TABS
10.0000 mg | ORAL_TABLET | Freq: Every day | ORAL | Status: DC
  Administered 2018-08-24 – 2018-08-26 (×3): 10 mg via ORAL
  Filled 2018-08-23 (×3): qty 1

## 2018-08-23 MED ORDER — POLYETHYLENE GLYCOL 3350 17 G PO PACK: 17.0000 g | PACK | Freq: Every day | ORAL | Status: DC | PRN

## 2018-08-23 SURGICAL SUPPLY — 57 items
ATTUNE PSFEM RTSZ6 NARCEM KNEE (Femur) ×1 IMPLANT
ATTUNE PSRP INSR SZ6 7 KNEE (Insert) ×1 IMPLANT
BAG ZIPLOCK 12X15 (MISCELLANEOUS) ×2 IMPLANT
BANDAGE ACE 6X5 VEL STRL LF (GAUZE/BANDAGES/DRESSINGS) ×3 IMPLANT
BASE TIBIAL ROT PLAT SZ 5 KNEE (Knees) IMPLANT
BLADE SAG 18X100X1.27 (BLADE) ×2 IMPLANT
BLADE SAW SGTL 11.0X1.19X90.0M (BLADE) ×2 IMPLANT
BLADE SURG SZ10 CARB STEEL (BLADE) ×4 IMPLANT
BOWL SMART MIX CTS (DISPOSABLE) ×2 IMPLANT
CEMENT HV SMART SET (Cement) ×4 IMPLANT
COVER SURGICAL LIGHT HANDLE (MISCELLANEOUS) ×2 IMPLANT
COVER WAND RF STERILE (DRAPES) IMPLANT
CUFF TOURN SGL QUICK 34 (TOURNIQUET CUFF) ×1
CUFF TRNQT CYL 34X4.125X (TOURNIQUET CUFF) ×1 IMPLANT
DECANTER SPIKE VIAL GLASS SM (MISCELLANEOUS) ×2 IMPLANT
DRAPE U-SHAPE 47X51 STRL (DRAPES) ×2 IMPLANT
DRSG ADAPTIC 3X8 NADH LF (GAUZE/BANDAGES/DRESSINGS) ×2 IMPLANT
DRSG PAD ABDOMINAL 8X10 ST (GAUZE/BANDAGES/DRESSINGS) ×2 IMPLANT
DURAPREP 26ML APPLICATOR (WOUND CARE) ×2 IMPLANT
ELECT REM PT RETURN 15FT ADLT (MISCELLANEOUS) ×2 IMPLANT
EVACUATOR 1/8 PVC DRAIN (DRAIN) ×2 IMPLANT
GAUZE SPONGE 4X4 12PLY STRL (GAUZE/BANDAGES/DRESSINGS) ×2 IMPLANT
GLOVE BIO SURGEON STRL SZ7 (GLOVE) ×2 IMPLANT
GLOVE BIO SURGEON STRL SZ8 (GLOVE) ×2 IMPLANT
GLOVE BIOGEL PI IND STRL 6.5 (GLOVE) ×1 IMPLANT
GLOVE BIOGEL PI IND STRL 7.0 (GLOVE) ×1 IMPLANT
GLOVE BIOGEL PI IND STRL 8 (GLOVE) ×1 IMPLANT
GLOVE BIOGEL PI INDICATOR 6.5 (GLOVE) ×1
GLOVE BIOGEL PI INDICATOR 7.0 (GLOVE) ×1
GLOVE BIOGEL PI INDICATOR 8 (GLOVE) ×1
GLOVE SURG SS PI 6.5 STRL IVOR (GLOVE) ×2 IMPLANT
GOWN STRL REUS W/TWL LRG LVL3 (GOWN DISPOSABLE) ×6 IMPLANT
HANDPIECE INTERPULSE COAX TIP (DISPOSABLE) ×1
HOLDER FOLEY CATH W/STRAP (MISCELLANEOUS) IMPLANT
IMMOBILIZER KNEE 20 (SOFTGOODS) ×4
IMMOBILIZER KNEE 20 THIGH 36 (SOFTGOODS) ×1 IMPLANT
KIT TURNOVER KIT A (KITS) IMPLANT
MANIFOLD NEPTUNE II (INSTRUMENTS) ×2 IMPLANT
NS IRRIG 1000ML POUR BTL (IV SOLUTION) ×2 IMPLANT
PACK TOTAL KNEE CUSTOM (KITS) ×2 IMPLANT
PADDING CAST COTTON 6X4 STRL (CAST SUPPLIES) ×5 IMPLANT
PATELLA MEDIAL ATTUN 35MM KNEE (Knees) ×1 IMPLANT
PIN STEINMAN FIXATION KNEE (PIN) ×1 IMPLANT
PIN THREADED HEADED SIGMA (PIN) ×1 IMPLANT
PROTECTOR NERVE ULNAR (MISCELLANEOUS) ×2 IMPLANT
SET HNDPC FAN SPRY TIP SCT (DISPOSABLE) ×1 IMPLANT
STRIP CLOSURE SKIN 1/2X4 (GAUZE/BANDAGES/DRESSINGS) ×3 IMPLANT
SUT MNCRL AB 4-0 PS2 18 (SUTURE) ×2 IMPLANT
SUT STRATAFIX 0 PDS 27 VIOLET (SUTURE) ×2
SUT VIC AB 2-0 CT1 27 (SUTURE) ×3
SUT VIC AB 2-0 CT1 TAPERPNT 27 (SUTURE) ×3 IMPLANT
SUTURE STRATFX 0 PDS 27 VIOLET (SUTURE) ×1 IMPLANT
TIBIAL BASE ROT PLAT SZ 5 KNEE (Knees) ×2 IMPLANT
TRAY FOLEY MTR SLVR 16FR STAT (SET/KITS/TRAYS/PACK) ×2 IMPLANT
WATER STERILE IRR 1000ML POUR (IV SOLUTION) ×4 IMPLANT
WRAP KNEE MAXI GEL POST OP (GAUZE/BANDAGES/DRESSINGS) ×2 IMPLANT
YANKAUER SUCT BULB TIP 10FT TU (MISCELLANEOUS) ×2 IMPLANT

## 2018-08-23 NOTE — Anesthesia Procedure Notes (Signed)
Anesthesia Regional Block: Adductor canal block   Pre-Anesthetic Checklist: ,, timeout performed, Correct Patient, Correct Site, Correct Laterality, Correct Procedure,, site marked, risks and benefits discussed, Surgical consent,  Pre-op evaluation,  At surgeon's request and post-op pain management  Laterality: Right  Prep: chloraprep       Needles:  Injection technique: Single-shot  Needle Type: Echogenic Stimulator Needle     Needle Length: 9cm  Needle Gauge: 21     Additional Needles:   Procedures:,,,, ultrasound used (permanent image in chart),,,,  Narrative:  Start time: 08/23/2018 10:50 AM End time: 08/23/2018 11:00 AM Injection made incrementally with aspirations every 5 mL.  Performed by: Personally  Anesthesiologist: Murvin Natal, MD  Additional Notes: Functioning IV was confirmed and monitors were applied. A time-out was performed. Hand hygiene and sterile gloves were used. The thigh was placed in a frog-leg position and prepped in a sterile fashion. A 52m 21ga Arrow echogenic stimulator needle was placed using ultrasound guidance.  Negative aspiration and negative test dose prior to incremental administration of local anesthetic. The patient tolerated the procedure well.

## 2018-08-23 NOTE — Progress Notes (Signed)
AssistedDr. Ellender with right, ultrasound guided, adductor canal block. Side rails up, monitors on throughout procedure. See vital signs in flow sheet. Tolerated Procedure well.  

## 2018-08-23 NOTE — Op Note (Signed)
OPERATIVE REPORT-TOTAL KNEE ARTHROPLASTY   Pre-operative diagnosis- Osteoarthritis  Right knee(s)  Post-operative diagnosis- Osteoarthritis Right knee(s)  Procedure-  Right  Total Knee Arthroplasty  Surgeon- Dione Plover. Florina Glas, MD  Assistant- Theresa Duty, PA-C   Anesthesia-  Adductor canal block and spinal  EBL- 25 ml   Drains Hemovac  Tourniquet time-  Total Tourniquet Time Documented: Thigh (Right) - 51 minutes Total: Thigh (Right) - 51 minutes     Complications- None  Condition-PACU - hemodynamically stable.   Brief Clinical Note  Jesse Bowers is a 53 y.o. year old adult with end stage OA of his right knee with progressively worsening pain and dysfunction. He has constant pain, with activity and at rest and significant functional deficits with difficulties even with ADLs. He has had extensive non-op management including analgesics, injections of cortisone and viscosupplements, and home exercise program, but remains in significant pain with significant dysfunction. Radiographs show advanced arthritic changes patellofemoral compartment. He presents now for right Total Knee Arthroplasty.    Procedure in detail---   The patient is brought into the operating room and positioned supine on the operating table. After successful administration of  Adductor canal block and spinal,   a tourniquet is placed high on the  Right thigh(s) and the lower extremity is prepped and draped in the usual sterile fashion. Time out is performed by the operating team and then the  Right lower extremity is wrapped in Esmarch, knee flexed and the tourniquet inflated to 300 mmHg.       A midline incision is made with a ten blade through the subcutaneous tissue to the level of the extensor mechanism. A fresh blade is used to make a medial parapatellar arthrotomy. Soft tissue over the proximal medial tibia is subperiosteally elevated to the joint line with a knife and into the semimembranosus bursa with  a Cobb elevator. Soft tissue over the proximal lateral tibia is elevated with attention being paid to avoiding the patellar tendon on the tibial tubercle. The patella is everted, knee flexed 90 degrees and the ACL and PCL are removed. Findings are exposed bone medial and patellofemoral compartments        The drill is used to create a starting hole in the distal femur and the canal is thoroughly irrigated with sterile saline to remove the fatty contents. The 5 degree Right  valgus alignment guide is placed into the femoral canal and the distal femoral cutting block is pinned to remove 9 mm off the distal femur. Resection is made with an oscillating saw.      The tibia is subluxed forward and the menisci are removed. The extramedullary alignment guide is placed referencing proximally at the medial aspect of the tibial tubercle and distally along the second metatarsal axis and tibial crest. The block is pinned to remove 62m off the more deficient medial  side. Resection is made with an oscillating saw. Size 5is the most appropriate size for the tibia and the proximal tibia is prepared with the modular drill and keel punch for that size.      The femoral sizing guide is placed and size 6 is most appropriate. Rotation is marked off the epicondylar axis and confirmed by creating a rectangular flexion gap at 90 degrees. The size 6 cutting block is pinned in this rotation and the anterior, posterior and chamfer cuts are made with the oscillating saw. The intercondylar block is then placed and that cut is made.      Trial size  5 tibial component, trial size 6 narrow posterior stabilized femur and a 7  mm posterior stabilized rotating platform insert trial is placed. Full extension is achieved with excellent varus/valgus and anterior/posterior balance throughout full range of motion. The patella is everted and thickness measured to be 22  mm. Free hand resection is taken to 12 mm, a 35 template is placed, lug holes are  drilled, trial patella is placed, and it tracks normally. Osteophytes are removed off the posterior femur with the trial in place. All trials are removed and the cut bone surfaces prepared with pulsatile lavage. Cement is mixed and once ready for implantation, the size 5 tibial implant, size  6 narrow posterior stabilized femoral component, and the size 35 patella are cemented in place and the patella is held with the clamp. The trial insert is placed and the knee held in full extension. The Exparel (20 ml mixed with 60 ml saline) is injected into the extensor mechanism, posterior capsule, medial and lateral gutters and subcutaneous tissues.  All extruded cement is removed and once the cement is hard the permanent 7 mm posterior stabilized rotating platform insert is placed into the tibial tray.      The wound is copiously irrigated with saline solution and the extensor mechanism closed over a hemovac drain with #1 V-loc suture. The tourniquet is released for a total tourniquet time of 51 minutes. Flexion against gravity is 140 degrees and the patella tracks normally. Subcutaneous tissue is closed with 2.0 vicryl and subcuticular with running 4.0 Monocryl. The incision is cleaned and dried and steri-strips and a bulky sterile dressing are applied. The limb is placed into a knee immobilizer and the patient is awakened and transported to recovery in stable condition.      Please note that a surgical assistant was a medical necessity for this procedure in order to perform it in a safe and expeditious manner. Surgical assistant was necessary to retract the ligaments and vital neurovascular structures to prevent injury to them and also necessary for proper positioning of the limb to allow for anatomic placement of the prosthesis.   Dione Plover Janee Ureste, MD    08/23/2018, 12:52 PM

## 2018-08-23 NOTE — Transfer of Care (Signed)
Immediate Anesthesia Transfer of Care Note  Patient: Jesse Bowers  Procedure(s) Performed: TOTAL KNEE ARTHROPLASTY (Right Knee)  Patient Location: PACU  Anesthesia Type:Spinal  Level of Consciousness: awake  Airway & Oxygen Therapy: Patient Spontanous Breathing and Patient connected to face mask oxygen  Post-op Assessment: Report given to RN and Post -op Vital signs reviewed and stable  Post vital signs: Reviewed and stable  Last Vitals:  Vitals Value Taken Time  BP 132/94 08/23/18 1318  Temp    Pulse 93 08/23/18 1319  Resp 16 08/23/18 1319  SpO2 94 % 08/23/18 1319  Vitals shown include unvalidated device data.  Last Pain:  Vitals:   08/23/18 1055  TempSrc:   PainSc: 7       Patients Stated Pain Goal: 4 (03/70/48 8891)  Complications: No apparent anesthesia complications

## 2018-08-23 NOTE — Anesthesia Postprocedure Evaluation (Signed)
Anesthesia Post Note  Patient: Jesse Bowers  Procedure(s) Performed: TOTAL KNEE ARTHROPLASTY (Right Knee)     Patient location during evaluation: PACU Anesthesia Type: Regional and Spinal Level of consciousness: oriented and awake and alert Pain management: pain level controlled Vital Signs Assessment: post-procedure vital signs reviewed and stable Respiratory status: spontaneous breathing, respiratory function stable and patient connected to nasal cannula oxygen Cardiovascular status: blood pressure returned to baseline and stable Postop Assessment: no headache, no backache, no apparent nausea or vomiting and spinal receding Anesthetic complications: no    Last Vitals:  Vitals:   08/23/18 1734 08/23/18 1750  BP:  (!) 156/92  Pulse: 96 87  Resp: 13 14  Temp:  36.6 C  SpO2: 96% 95%    Last Pain:  Vitals:   08/23/18 1750  TempSrc: Oral  PainSc: Asleep                 [name] P Ellender

## 2018-08-23 NOTE — Telephone Encounter (Signed)
His surgery is today. There is no opportunity for a pre op visit.

## 2018-08-23 NOTE — Progress Notes (Signed)
PT Cancellation Note  Patient Details Name: Jesse Bowers MRN: [id number] DOB: 02/19/1966   Cancelled Treatment:    Reason Eval/Treat Not Completed: Pain limiting ability to participate - Pt reports his pain is at least 7/10, pt with grimacing and difficulty opening eyes and interacting with PT due to pain. RN notified, pt defers PT until tomorrow.   Julien Girt, PT Acute Rehabilitation Services Pager [phone]  Office [phone]    Julien Girt 08/23/2018, 6:33 PM

## 2018-08-23 NOTE — Anesthesia Procedure Notes (Signed)
Procedure Name: MAC Date/Time: 08/23/2018 11:25 AM Performed by: Niel Hummer, CRNA Pre-anesthesia Checklist: Patient identified, Emergency Drugs available, Suction available and Patient being monitored Patient Re-evaluated:Patient Re-evaluated prior to induction Oxygen Delivery Method: Simple face mask

## 2018-08-23 NOTE — Anesthesia Procedure Notes (Signed)
Spinal  Patient location during procedure: OR Start time: 08/23/2018 11:25 AM End time: 08/23/2018 11:30 AM Staffing Anesthesiologist: Murvin Natal, MD Performed: anesthesiologist  Preanesthetic Checklist Completed: patient identified, surgical consent, pre-op evaluation, timeout performed, IV checked, risks and benefits discussed and monitors and equipment checked Spinal Block Patient position: sitting Prep: DuraPrep Patient monitoring: cardiac monitor, continuous pulse ox and blood pressure Approach: midline Location: L4-5 Injection technique: single-shot Needle Needle type: Pencan  Needle gauge: 24 G Needle length: 9 cm Assessment Sensory level: T10 Additional Notes Functioning IV was confirmed and monitors were applied. Sterile prep and drape, including hand hygiene and sterile gloves were used. The patient was positioned and the spine was prepped. The skin was anesthetized with lidocaine.  Unsuccessful attempt by CRNA. Free flow of clear CSF was obtained prior to injecting local anesthetic into the CSF.  The spinal needle aspirated freely following injection.  The needle was carefully withdrawn.  The patient tolerated the procedure well.

## 2018-08-23 NOTE — Interval H&P Note (Signed)
History and Physical Interval Note:  08/23/2018 9:19 AM  Jesse Bowers  has presented today for surgery, with the diagnosis of right knee osteoarthritis.  The various methods of treatment have been discussed with the patient and family. After consideration of risks, benefits and other options for treatment, the patient has consented to  Procedure(s) with comments: TOTAL KNEE ARTHROPLASTY (Right) - 5mn as a surgical intervention.  The patient's history has been reviewed, patient examined, no change in status, stable for surgery.  I have reviewed the patient's chart and labs.  Questions were answered to the patient's satisfaction.     FPilar PlateAluisio

## 2018-08-24 ENCOUNTER — Telehealth: Payer: Self-pay | Admitting: Hematology

## 2018-08-24 ENCOUNTER — Encounter (HOSPITAL_COMMUNITY): Payer: Self-pay | Admitting: Orthopedic Surgery

## 2018-08-24 ENCOUNTER — Ambulatory Visit: Payer: BLUE CROSS/BLUE SHIELD | Admitting: Psychology

## 2018-08-24 DIAGNOSIS — Z9013 Acquired absence of bilateral breasts and nipples: Secondary | ICD-10-CM | POA: Diagnosis not present

## 2018-08-24 DIAGNOSIS — Z79899 Other long term (current) drug therapy: Secondary | ICD-10-CM | POA: Diagnosis not present

## 2018-08-24 DIAGNOSIS — Z7901 Long term (current) use of anticoagulants: Secondary | ICD-10-CM | POA: Diagnosis not present

## 2018-08-24 DIAGNOSIS — G894 Chronic pain syndrome: Secondary | ICD-10-CM | POA: Diagnosis present

## 2018-08-24 DIAGNOSIS — R5382 Chronic fatigue, unspecified: Secondary | ICD-10-CM | POA: Diagnosis present

## 2018-08-24 DIAGNOSIS — Z86718 Personal history of other venous thrombosis and embolism: Secondary | ICD-10-CM | POA: Diagnosis not present

## 2018-08-24 DIAGNOSIS — E669 Obesity, unspecified: Secondary | ICD-10-CM | POA: Diagnosis present

## 2018-08-24 DIAGNOSIS — K219 Gastro-esophageal reflux disease without esophagitis: Secondary | ICD-10-CM | POA: Diagnosis present

## 2018-08-24 DIAGNOSIS — K900 Celiac disease: Secondary | ICD-10-CM | POA: Diagnosis present

## 2018-08-24 DIAGNOSIS — F649 Gender identity disorder, unspecified: Secondary | ICD-10-CM | POA: Diagnosis present

## 2018-08-24 DIAGNOSIS — M1711 Unilateral primary osteoarthritis, right knee: Secondary | ICD-10-CM | POA: Diagnosis present

## 2018-08-24 DIAGNOSIS — I5032 Chronic diastolic (congestive) heart failure: Secondary | ICD-10-CM | POA: Diagnosis present

## 2018-08-24 DIAGNOSIS — M25561 Pain in right knee: Secondary | ICD-10-CM | POA: Diagnosis present

## 2018-08-24 DIAGNOSIS — F4310 Post-traumatic stress disorder, unspecified: Secondary | ICD-10-CM | POA: Diagnosis present

## 2018-08-24 DIAGNOSIS — Z6832 Body mass index (BMI) 32.0-32.9, adult: Secondary | ICD-10-CM | POA: Diagnosis not present

## 2018-08-24 DIAGNOSIS — K760 Fatty (change of) liver, not elsewhere classified: Secondary | ICD-10-CM | POA: Diagnosis present

## 2018-08-24 DIAGNOSIS — I69351 Hemiplegia and hemiparesis following cerebral infarction affecting right dominant side: Secondary | ICD-10-CM | POA: Diagnosis not present

## 2018-08-24 LAB — CBC
HCT: 34.5 % — ABNORMAL LOW (ref 39.0–52.0)
Hemoglobin: 10.2 g/dL — ABNORMAL LOW (ref 13.0–17.0)
MCH: 23.6 pg — ABNORMAL LOW (ref 26.0–34.0)
MCHC: 29.6 g/dL — ABNORMAL LOW (ref 30.0–36.0)
MCV: 79.7 fL — ABNORMAL LOW (ref 80.0–100.0)
Platelets: 160 10*3/uL (ref 150–400)
RBC: 4.33 MIL/uL (ref 4.22–5.81)
RDW: 15.3 % (ref 11.5–15.5)
WBC: 6.9 10*3/uL (ref 4.0–10.5)
nRBC: 0 % (ref 0.0–0.2)

## 2018-08-24 LAB — BASIC METABOLIC PANEL
Anion gap: 9 (ref 5–15)
BUN: 19 mg/dL (ref 6–20)
CO2: 25 mmol/L (ref 22–32)
Calcium: 8.3 mg/dL — ABNORMAL LOW (ref 8.9–10.3)
Chloride: 101 mmol/L (ref 98–111)
Creatinine, Ser: 0.96 mg/dL (ref 0.61–1.24)
GFR calc Af Amer: >60 mL/min (ref >60)
GFR calc non Af Amer: >60 mL/min (ref >60)
Glucose, Bld: 178 mg/dL — ABNORMAL HIGH (ref 70–99)
Potassium: 3.9 mmol/L (ref 3.5–5.1)
Sodium: 135 mmol/L (ref 135–145)

## 2018-08-24 MED ORDER — URSODIOL 300 MG PO CAPS
900.0000 mg | ORAL_CAPSULE | Freq: Every day | ORAL | Status: DC
  Administered 2018-08-24 – 2018-08-26 (×3): 900 mg via ORAL
  Filled 2018-08-24 (×3): qty 3

## 2018-08-24 MED ORDER — HYDROMORPHONE HCL 1 MG/ML IJ SOLN
0.5000 mg | INTRAMUSCULAR | Status: DC | PRN
  Administered 2018-08-24 (×3): 1 mg via INTRAVENOUS
  Filled 2018-08-24 (×3): qty 1

## 2018-08-24 MED ORDER — URSODIOL 300 MG PO CAPS
600.0000 mg | ORAL_CAPSULE | Freq: Every day | ORAL | Status: DC
  Administered 2018-08-25 – 2018-08-26 (×2): 600 mg via ORAL
  Filled 2018-08-24 (×3): qty 2

## 2018-08-24 NOTE — Progress Notes (Signed)
Subjective: 1 Day Post-Op Procedure(s) (LRB): TOTAL KNEE ARTHROPLASTY (Right) Patient reports pain as moderate.   Patient seen in rounds by Dr. Wynelle Link. Patient is well, and has had no acute complaints or problems other than pain in the right knee. Denies chest pain, SOB, or calf pain. No issues overnight. Foley catheter removed this AM.  Refused physical therapy yesterday due to pain. We will begin therapy today.   Objective: Vital signs in last 24 hours: Temp:  [97.5 F (36.4 C)-99.1 F (37.3 C)] 99.1 F (37.3 C) (06/23 0644) Pulse Rate:  [85-112] 107 (06/23 0644) Resp:  [9-19] 18 (06/23 0644) BP: (109-156)/(69-100) 140/86 (06/23 0644) SpO2:  [92 %-99 %] 96 % (06/23 0644) Weight:  [100.8 kg] 100.8 kg (06/22 0944)  Intake/Output from previous day:  Intake/Output Summary (Last 24 hours) at 08/24/2018 0734 Last data filed at 08/24/2018 0733 Gross per 24 hour  Intake 3173.53 ml  Output 2540 ml  Net 633.53 ml     Intake/Output this shift: Total I/O In: 240 [P.O.:240] Out: -   Labs: Recent Labs    08/24/18 0311  HGB 10.2*   Recent Labs    08/24/18 0311  WBC 6.9  RBC 4.33  HCT 34.5*  PLT 160   Recent Labs    08/24/18 0311  NA 135  K 3.9  CL 101  CO2 25  BUN 19  CREATININE 0.96  GLUCOSE 178*  CALCIUM 8.3*   Exam: General - Patient is Alert and Oriented Extremity - Neurologically intact Neurovascular intact Sensation intact distally Dorsiflexion/Plantar flexion intact Dressing - dressing C/D/I Motor Function - intact, moving foot and toes well on exam.   Past Medical History:  Diagnosis Date  . Abnormal weight loss   . Anxiety   . Arthritis   . Celiac disease   . Cervical neck pain with evidence of disc disease    patient has a cyst   . Chronic constipation   . Chronic diastolic heart failure ([place])    Pt. denies  . Chronic pain   . Degenerative disc disease at L5-S1 level    with stenosis  . DVT (deep venous thrombosis) (H[facility])    Right  upper arm, bilateral leg  . Eczema    inguinal, feet  . Elevated liver enzymes   . 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 Family history of adverse reaction to anesthesia    family has problems with anesthesia of nausea and vomiting   . 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 GERD (gastroesophageal reflux disease)    History of in 20's  . Gluten enteropathy   . H/O parotitis    right   . Hard of hearing   . History of kidney stones   . History of retinal tear    Bilateral  . History of staph infection    required wound vac  . Hx-TIA (transient ischemic attack)    2015  . LVH (left ventricular hypertrophy) 12/15/2016   Mild, noted on ECHO  . MVP (mitral valve prolapse)   . NAFL (nonalcoholic fatty liver)   . Neck pain   . Neuromuscular disorder ([place])    bilateral neuropathy feet.  . Pneumonia 12/17/2010  . Polycythemia   . Polycythemia, secondary   . PONV (postoperative nausea and vomiting)   . Protein C deficiency ([place])    Dr. Anne Fu  . Psoriasis    16 X10 cm psoriatic rash on sole of left foot ; open and occ scant bleeding;   .  psoriatic arthritis   . PTSD (post-traumatic stress disorder)   . Scaphoid fracture of wrist 09/23/2013  . Seizure ([place])    childhood, medication until age 75 then weanned completely off  . Sleep apnea    split night study last done by Dr. [name] 06/18/15 shows severe OSA, CSA, and hypersomnia, rec bipap  . Splenomegaly   . Stenosis of ureteropelvic junction (UPJ)    left  . 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 [name] follows  . 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 Tachycardia    hx of   . Transfusion history    past history- none recent, after surgeries due to blood loss  . Wears glasses   . Wears hearing aid     Assessment/Plan: 1 Day Post-Op Procedure(s) (LRB): TOTAL KNEE ARTHROPLASTY (Right) Principal Problem:   OA (osteoarthritis) of knee Active  Problems:   Osteoarthritis of right knee  Estimated body mass index is 32.81 kg/m as calculated from the following:   Height as of this encounter: 5' 9"  (1.753 m).   Weight as of this encounter: 100.8 kg. Advance diet Up with therapy  Anticipated LOS equal to or greater than 2 midnights due to - Age 76 and older with one or more of the following:  - Obesity  - Expected need for hospital services (PT, OT, Nursing) required for safe  discharge  - Anticipated need for postoperative skilled nursing care or inpatient rehab  - Active co-morbidities: Chronic pain requiring opiods, Stroke and DVT/VTE OR   - Unanticipated findings during/Post Surgery: None  - Patient is a high risk of re-admission due to: None    DVT Prophylaxis - Xarelto Weight bearing as tolerated. D/C O2 and pulse ox and try on room air. Hemovac pulled without difficulty, will begin therapy today.  Plan is to go Home after hospital stay. Will require stay today/tonight due to medical comorbidities and for pain control. Plan for discharge tomorrow.  Theresa Duty, PA-C Orthopedic Surgery 08/24/2018, 7:34 AM

## 2018-08-24 NOTE — Evaluation (Signed)
Physical Therapy Evaluation Patient Details Name: Jesse Bowers MRN: [id number] DOB: [birth year] Today's Date: 08/24/2018   History of Present Illness  Pt is a 53 yo male s/p R TKR.  Clinical Impression  Pt presents with decreased mobility and independence secondary to R TKR. Pt has multiple co morbidities contributing to his decreased Independence. Pt reports he will have assistance from his father and daughter after d/c home. Pt presents with h/o L foot drop contributing to his fall risk. Pt ambulated with bilateral PFRW 90 ft with min guard assist. Pt has steps to enter his home, but can use W/C to get inside due to limited mobility and use of UEs. Attempted to see pt earlier in the am and pt reported he had just gotten back in bed from walking to the bathroom. Pt requested I return after lunch for therapy. Pt will continue to benefit from acute skilled PT to maximize mobility and Independence for d/c home with HHPT  Follow Up Recommendations Home health PT;Supervision for mobility/OOB    Equipment Recommendations  Rolling walker with 5" wheels;Other (comment)(bilateral platforms)    Recommendations for Other Services       Precautions / Restrictions Precautions Precautions: Knee Precaution Comments: Psoriatic Arthritis, L foot drop Restrictions Other Position/Activity Restrictions: WBAT      Mobility  Bed Mobility Overal bed mobility: Needs Assistance Bed Mobility: Supine to Sit;Sit to Supine     Supine to sit: Min assist Sit to supine: Min assist   General bed mobility comments: cues for hand placement, assistance lifting R LE  Transfers Overall transfer level: Needs assistance Equipment used: Rolling walker (2 wheeled);Bilateral platform walker Transfers: Sit to/from Stand Sit to Stand: Min assist         General transfer comment: cues for hand placement  Ambulation/Gait Ambulation/Gait assistance: Min guard Gait Distance (Feet): 90 Feet Assistive device:  Bilateral platform walker Gait Pattern/deviations: Step-to pattern;Decreased weight shift to right;Decreased stance time - right Gait velocity: decreased   General Gait Details: left foot drop, has AFO at home  Stairs            Wheelchair Mobility    Modified Rankin (Stroke Patients Only)       Balance Overall balance assessment: No apparent balance deficits (not formally assessed)                                           Pertinent Vitals/Pain Pain Assessment: 0-10 Pain Score: 5  Pain Location: Right knee Pain Descriptors / Indicators: Discomfort;Aching Pain Intervention(s): Limited activity within patient's tolerance;Repositioned    Home Living Family/patient expects to be discharged to:: Private residence   Available Help at Discharge: Family;Available 24 hours/day Type of Home: House Home Access: Stairs to enter Entrance Stairs-Rails: None Entrance Stairs-Number of Steps: 3 Home Layout: Able to live on main level with bedroom/bathroom Home Equipment: Wheelchair - [employer] - 2 wheels      Prior Function Level of Independence: Independent with assistive device(s)               Hand Dominance        Extremity/Trunk Assessment        Lower Extremity Assessment Lower Extremity Assessment: RLE deficits/detail RLE: Unable to fully assess due to pain       Communication   Communication: No difficulties  Cognition Arousal/Alertness: Awake/alert Behavior During Therapy: WFL for tasks assessed/performed Overall  Cognitive Status: Within Functional Limits for tasks assessed                                        General Comments General comments (skin integrity, edema, etc.): Placed pt in CPM after therapy 0-40*    Exercises Total Joint Exercises Ankle Circles/Pumps: AROM;Strengthening;Both;10 reps;Supine Quad Sets: AROM;Strengthening;Both;10 reps;Supine Heel Slides: AAROM;Strengthening;Right;10  reps;Supine Hip ABduction/ADduction: AAROM;Strengthening;Right;10 reps;Supine Straight Leg Raises: AAROM;Strengthening;Right;10 reps;Supine Long Arc Quad: AAROM;Strengthening;Right;10 reps;Seated Knee Flexion: AAROM;Strengthening;Right;10 reps;Seated Goniometric ROM: 0-40*   Assessment/Plan    PT Assessment Patient needs continued PT services  PT Problem List Decreased strength;Decreased mobility;Decreased safety awareness;Decreased range of motion;Decreased knowledge of precautions;Decreased activity tolerance;Decreased balance;Decreased knowledge of use of DME;Pain       PT Treatment Interventions DME instruction;Therapeutic activities;Gait training;Therapeutic exercise;Patient/family education;Neuromuscular re-education;Functional mobility training;Stair training;Balance training    PT Goals (Current goals can be found in the Care Plan section)  Acute Rehab PT Goals Patient Stated Goal: To walk and be Independent PT Goal Formulation: With patient Time For Goal Achievement: 08/31/18 Potential to Achieve Goals: Good    Frequency 7X/week   Barriers to discharge        Co-evaluation               AM-PAC PT "6 Clicks" Mobility  Outcome Measure Help needed turning from your back to your side while in a flat bed without using bedrails?: A Little Help needed moving from lying on your back to sitting on the side of a flat bed without using bedrails?: A Little Help needed moving to and from a bed to a chair (including a wheelchair)?: A Little Help needed standing up from a chair using your arms (e.g., wheelchair or bedside chair)?: A Little Help needed to walk in hospital room?: A Little Help needed climbing 3-5 steps with a railing? : Total 6 Click Score: 16    End of Session Equipment Utilized During Treatment: Gait belt Activity Tolerance: Patient tolerated treatment well Patient left: in bed;with call bell/phone within reach Nurse Communication: Mobility status PT  Visit Diagnosis: Difficulty in walking, not elsewhere classified (R26.2)    Time: 3887-1959 PT Time Calculation (min) (ACUTE ONLY): 33 min   Charges:   PT Evaluation $PT Eval Low Complexity: 1 Low PT Treatments $Gait Training: 8-22 mins        Theodoro Grist, PT  Lelon Mast 08/24/2018, 2:03 PM

## 2018-08-24 NOTE — Plan of Care (Signed)
  Problem: Education: Goal: Knowledge of the prescribed therapeutic regimen will improve Outcome: Progressing   Problem: Education: Goal: Individualized Educational Video(s) Outcome: Progressing   Problem: Activity: Goal: Ability to avoid complications of mobility impairment will improve Outcome: Progressing   Problem: Activity: Goal: Range of joint motion will improve Outcome: Progressing   Problem: Clinical Measurements: Goal: Postoperative complications will be avoided or minimized Outcome: Progressing   Problem: Pain Management: Goal: Pain level will decrease with appropriate interventions Outcome: Progressing

## 2018-08-24 NOTE — Telephone Encounter (Signed)
Scheduled appt per 6/22 sch message- unable to reach pt . Left message with appt date and time

## 2018-08-25 LAB — CBC
HCT: 31.4 % — ABNORMAL LOW (ref 39.0–52.0)
Hemoglobin: 9.3 g/dL — ABNORMAL LOW (ref 13.0–17.0)
MCH: 24.2 pg — ABNORMAL LOW (ref 26.0–34.0)
MCHC: 29.6 g/dL — ABNORMAL LOW (ref 30.0–36.0)
MCV: 81.8 fL (ref 80.0–100.0)
Platelets: 148 10*3/uL — ABNORMAL LOW (ref 150–400)
RBC: 3.84 MIL/uL — ABNORMAL LOW (ref 4.22–5.81)
RDW: 15.9 % — ABNORMAL HIGH (ref 11.5–15.5)
WBC: 7.9 10*3/uL (ref 4.0–10.5)
nRBC: 0 % (ref 0.0–0.2)

## 2018-08-25 LAB — BASIC METABOLIC PANEL
Anion gap: 11 (ref 5–15)
BUN: 13 mg/dL (ref 6–20)
CO2: 23 mmol/L (ref 22–32)
Calcium: 8.1 mg/dL — ABNORMAL LOW (ref 8.9–10.3)
Chloride: 99 mmol/L (ref 98–111)
Creatinine, Ser: 0.84 mg/dL (ref 0.61–1.24)
GFR calc Af Amer: >60 mL/min (ref >60)
GFR calc non Af Amer: >60 mL/min (ref >60)
Glucose, Bld: 138 mg/dL — ABNORMAL HIGH (ref 70–99)
Potassium: 3.6 mmol/L (ref 3.5–5.1)
Sodium: 133 mmol/L — ABNORMAL LOW (ref 135–145)

## 2018-08-25 MED ORDER — METHOCARBAMOL 500 MG PO TABS: 500.0000 mg | ORAL_TABLET | Freq: Four times a day (QID) | ORAL | 0 refills | Status: DC | PRN

## 2018-08-25 MED ORDER — OXYCODONE HCL 5 MG PO TABS: 5.0000 mg | ORAL_TABLET | Freq: Four times a day (QID) | ORAL | 0 refills | Status: DC | PRN

## 2018-08-25 NOTE — Progress Notes (Signed)
Pt. placed on CPAP, tolerating well, aware to notify if needed.

## 2018-08-25 NOTE — Progress Notes (Signed)
Subjective: 2 Days Post-Op Procedure(s) (LRB): TOTAL KNEE ARTHROPLASTY (Right) Patient reports pain as moderate.   Patient seen in rounds with Dr. Wynelle Link. Patient is well, and has had no acute complaints or problems other than pain in the right knee. Denies chest pain, SOB or calf pain. Voiding without difficulty and positive flatus. Had issues with pain control yesterday, IV morphine switched to IV dilaudid 0.5-1 mg with relief.  Plan is to go Home after hospital stay.  Objective: Vital signs in last 24 hours: Temp:  [98.1 F (36.7 C)-100.2 F (37.9 C)] 100 F (37.8 C) (06/24 0517) Pulse Rate:  [94-109] 109 (06/24 0517) Resp:  [18-23] 18 (06/24 0517) BP: (134-148)/(77-87) 134/77 (06/24 0517) SpO2:  [97 %-100 %] 97 % (06/24 0517)  Intake/Output from previous day:  Intake/Output Summary (Last 24 hours) at 08/25/2018 0713 Last data filed at 08/25/2018 0500 Gross per 24 hour  Intake 1328.63 ml  Output 750 ml  Net 578.63 ml    Labs: Recent Labs    08/24/18 0311 08/25/18 0305  HGB 10.2* 9.3*   Recent Labs    08/24/18 0311 08/25/18 0305  WBC 6.9 7.9  RBC 4.33 3.84*  HCT 34.5* 31.4*  PLT 160 148*   Recent Labs    08/24/18 0311 08/25/18 0305  NA 135 133*  K 3.9 3.6  CL 101 99  CO2 25 23  BUN 19 13  CREATININE 0.96 0.84  GLUCOSE 178* 138*  CALCIUM 8.3* 8.1*   Exam: General - Patient is Alert and Oriented Extremity - Neurologically intact Neurovascular intact Sensation intact distally Dorsiflexion/Plantar flexion intact Dressing/Incision - clean, dry, no drainage Motor Function - intact, moving foot and toes well on exam.   Past Medical History:  Diagnosis Date  . Abnormal weight loss   . Anxiety   . Arthritis   . Celiac disease   . Cervical neck pain with evidence of disc disease    patient has a cyst   . Chronic constipation   . Chronic diastolic heart failure ([place])    Pt. denies  . Chronic pain   . Degenerative disc disease at L5-S1 level    with stenosis  . DVT (deep venous thrombosis) ([facility])    Right upper arm, bilateral leg  . Eczema    inguinal, feet  . Elevated liver enzymes   . 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 Family history of adverse reaction to anesthesia    family has problems with anesthesia of nausea and vomiting   . 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 GERD (gastroesophageal reflux disease)    History of in 20's  . Gluten enteropathy   . H/O parotitis    right   . Hard of hearing   . History of kidney stones   . History of retinal tear    Bilateral  . History of staph infection    required wound vac  . Hx-TIA (transient ischemic attack)    2015  . LVH (left ventricular hypertrophy) 12/15/2016   Mild, noted on ECHO  . MVP (mitral valve prolapse)   . NAFL (nonalcoholic fatty liver)   . Neck pain   . Neuromuscular disorder ([place])    bilateral neuropathy feet.  . Pneumonia 12/17/2010  . Polycythemia   . Polycythemia, secondary   . PONV (postoperative nausea and vomiting)   . Protein C deficiency ([place])    Dr. Anne Fu  . Psoriasis    16 X10 cm psoriatic rash on sole of  left foot ; open and occ scant bleeding;   . psoriatic arthritis   . PTSD (post-traumatic stress disorder)   . Scaphoid fracture of wrist 09/23/2013  . Seizure ([place])    childhood, medication until age 56 then weanned completely off  . Sleep apnea    split night study last done by Dr. [name] 06/18/15 shows severe OSA, CSA, and hypersomnia, rec bipap  . Splenomegaly   . Stenosis of ureteropelvic junction (UPJ)    left  . 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 [name] follows  . 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 Tachycardia    hx of   . Transfusion history    past history- none recent, after surgeries due to blood loss  . Wears glasses   . Wears hearing aid     Assessment/Plan: 2 Days Post-Op Procedure(s) (LRB): TOTAL KNEE ARTHROPLASTY  (Right) Principal Problem:   OA (osteoarthritis) of knee Active Problems:   Osteoarthritis of right knee  Estimated body mass index is 32.81 kg/m as calculated from the following:   Height as of this encounter: 5' 9"  (1.753 m).   Weight as of this encounter: 100.8 kg. Up with therapy D/C IV fluids  DVT Prophylaxis - Xarelto Weight-bearing as tolerated  Plan for discharge to home later today after two sessions of physical therapy. IV pain medication discontinued, will not be able to go home until pain is managed with PO medications only. Scheduled for outpatient PT at [facility] beginning Friday. Follow-up in the office in 2 weeks.   Theresa Duty, PA-C Orthopedic Surgery 08/25/2018, 7:13 AM

## 2018-08-25 NOTE — Discharge Instructions (Signed)
° °Dr. Frank Aluisio °Total Joint Specialist °[facility] °[address] °, [place] [address] °[phone] ° °TOTAL KNEE REPLACEMENT POSTOPERATIVE DIRECTIONS ° °Knee Rehabilitation, Guidelines Following Surgery  °Results after knee surgery are often greatly improved when you follow the exercise, range of motion and muscle strengthening exercises prescribed by your doctor. Safety measures are also important to protect the knee from further injury. Any time any of these exercises cause you to have increased pain or swelling in your knee joint, decrease the amount until you are comfortable again and slowly increase them. If you have problems or questions, call your caregiver or physical therapist for advice.  ° °HOME CARE INSTRUCTIONS  °• Remove items at home which could result in a fall. This includes throw rugs or furniture in walking pathways.  °· ICE to the affected knee every three hours for 30 minutes at a time and then as needed for pain and swelling.  Continue to use ice on the knee for pain and swelling from surgery. You may notice swelling that will progress down to the foot and ankle.  This is normal after surgery.  Elevate the leg when you are not up walking on it.   °· Continue to use the breathing machine which will help keep your temperature down.  It is common for your temperature to cycle up and down following surgery, especially at night when you are not up moving around and exerting yourself.  The breathing machine keeps your lungs expanded and your temperature down. °· Do not place pillow under knee, focus on keeping the knee straight while resting ° °DIET °You may resume your previous home diet once your are discharged from the hospital. ° °DRESSING / WOUND CARE / SHOWERING °You may shower 3 days after surgery, but keep the wounds dry during showering.  You may use an occlusive plastic wrap (Press'n Seal for example), NO SOAKING/SUBMERGING IN THE BATHTUB.  If the bandage  gets wet, change with a clean dry gauze.  If the incision gets wet, pat the wound dry with a clean towel. °You may start showering once you are discharged home but do not submerge the incision under water. Just pat the incision dry and apply a dry gauze dressing on daily. °Change the surgical dressing daily and reapply a dry dressing each time. ° °ACTIVITY °Walk with your walker as instructed. °Use walker as long as suggested by your caregivers. °Avoid periods of inactivity such as sitting longer than an hour when not asleep. This helps prevent blood clots.  °You may resume a sexual relationship in one month or when given the OK by your doctor.  °You may return to work once you are cleared by your doctor.  °Do not drive a car for 6 weeks or until released by you surgeon.  °Do not drive while taking narcotics. ° °WEIGHT BEARING °Weight bearing as tolerated with assist device (walker, cane, etc) as directed, use it as long as suggested by your surgeon or therapist, typically at least 4-6 weeks. ° °POSTOPERATIVE CONSTIPATION PROTOCOL °Constipation - defined medically as fewer than three stools per week and severe constipation as less than one stool per week. ° °One of the most common issues patients have following surgery is constipation.  Even if you have a regular bowel pattern at home, your normal regimen is likely to be disrupted due to multiple reasons following surgery.  Combination of anesthesia, postoperative narcotics, change in appetite and fluid intake all can affect your bowels.    In order to avoid complications following surgery, here are some recommendations in order to help you during your recovery period. ° °Colace (docusate) - Pick up an over-the-counter form of Colace or another stool softener and take twice a day as long as you are requiring postoperative pain medications.  Take with a full glass of water daily.  If you experience loose stools or diarrhea, hold the colace until you stool forms back  up.  If your symptoms do not get better within 1 week or if they get worse, check with your doctor. ° °Dulcolax (bisacodyl) - Pick up over-the-counter and take as directed by the product packaging as needed to assist with the movement of your bowels.  Take with a full glass of water.  Use this product as needed if not relieved by Colace only.  ° °MiraLax (polyethylene glycol) - Pick up over-the-counter to have on hand.  MiraLax is a solution that will increase the amount of water in your bowels to assist with bowel movements.  Take as directed and can mix with a glass of water, juice, soda, coffee, or tea.  Take if you go more than two days without a movement. °Do not use MiraLax more than once per day. Call your doctor if you are still constipated or irregular after using this medication for 7 days in a row. ° °If you continue to have problems with postoperative constipation, please contact the office for further assistance and recommendations.  If you experience "the worst abdominal pain ever" or develop nausea or vomiting, please contact the office immediatly for further recommendations for treatment. ° °ITCHING ° If you experience itching with your medications, try taking only a single pain pill, or even half a pain pill at a time.  You can also use Benadryl over the counter for itching or also to help with sleep.  ° °TED HOSE STOCKINGS °Wear the elastic stockings on both legs for three weeks following surgery during the day but you may remove then at night for sleeping. ° °MEDICATIONS °See your medication summary on the “After Visit Summary” that the nursing staff will review with you prior to discharge.  You may have some home medications which will be placed on hold until you complete the course of blood thinner medication.  It is important for you to complete the blood thinner medication as prescribed by your surgeon.  Continue your approved medications as instructed at time of discharge. ° °PRECAUTIONS °If  you experience chest pain or shortness of breath - call 911 immediately for transfer to the hospital emergency department.  °If you develop a fever greater that 101 F, purulent drainage from wound, increased redness or drainage from wound, foul odor from the wound/dressing, or calf pain - CONTACT YOUR SURGEON.   °                                                °FOLLOW-UP APPOINTMENTS °Make sure you keep all of your appointments after your operation with your surgeon and caregivers. You should call the office at the above phone number and make an appointment for approximately two weeks after the date of your surgery or on the date instructed by your surgeon outlined in the "After Visit Summary". ° ° °RANGE OF MOTION AND STRENGTHENING EXERCISES  °Rehabilitation of the knee is important following a knee injury or   an operation. After just a few days of immobilization, the muscles of the thigh which control the knee become weakened and shrink (atrophy). Knee exercises are designed to build up the tone and strength of the thigh muscles and to improve knee motion. Often times heat used for twenty to thirty minutes before working out will loosen up your tissues and help with improving the range of motion but do not use heat for the first two weeks following surgery. These exercises can be done on a training (exercise) mat, on the floor, on a table or on a bed. Use what ever works the best and is most comfortable for you Knee exercises include:  °• Leg Lifts - While your knee is still immobilized in a splint or cast, you can do straight leg raises. Lift the leg to 60 degrees, hold for 3 sec, and slowly lower the leg. Repeat 10-20 times 2-3 times daily. Perform this exercise against resistance later as your knee gets better.  °• Quad and Hamstring Sets - Tighten up the muscle on the front of the thigh (Quad) and hold for 5-10 sec. Repeat this 10-20 times hourly. Hamstring sets are done by pushing the foot backward against an  object and holding for 5-10 sec. Repeat as with quad sets.  °· Leg Slides: Lying on your back, slowly slide your foot toward your buttocks, bending your knee up off the floor (only go as far as is comfortable). Then slowly slide your foot back down until your leg is flat on the floor again. °· Angel Wings: Lying on your back spread your legs to the side as far apart as you can without causing discomfort.  °A rehabilitation program following serious knee injuries can speed recovery and prevent re-injury in the future due to weakened muscles. Contact your doctor or a physical therapist for more information on knee rehabilitation.  ° °IF YOU ARE TRANSFERRED TO A SKILLED REHAB FACILITY °If the patient is transferred to a skilled rehab facility following release from the hospital, a list of the current medications will be sent to the facility for the patient to continue.  When discharged from the skilled rehab facility, please have the facility set up the patient's Home Health Physical Therapy prior to being released. Also, the skilled facility will be responsible for providing the patient with their medications at time of release from the facility to include their pain medication, the muscle relaxants, and their blood thinner medication. If the patient is still at the rehab facility at time of the two week follow up appointment, the skilled rehab facility will also need to assist the patient in arranging follow up appointment in our office and any transportation needs. ° °MAKE SURE YOU:  °• Understand these instructions.  °• Get help right away if you are not doing well or get worse.  ° ° °Pick up stool softner and laxative for home use following surgery while on pain medications. °Do not submerge incision under water. °Please use good hand washing techniques while changing dressing each day. °May shower starting three days after surgery. °Please use a clean towel to pat the incision dry following showers. °Continue to  use ice for pain and swelling after surgery. °Do not use any lotions or creams on the incision until instructed by your surgeon. ° °

## 2018-08-25 NOTE — Progress Notes (Signed)
Physical Therapy Treatment Patient Details Name: Jesse Bowers MRN: [id number] DOB: [birth year] Today's Date: 08/25/2018    History of Present Illness Pt is a 53 yo male s/p R TKR.    PT Comments    Pt continues to reports pain and limited mobility. Pt  Is slowly progressing. Pt verbalized concerns regarding d/c home today. Pt stated he has a lower bed and he will not be able to safely stand up from. Pt is able to use w/c to avoid steps. Pt does fatigue quickly with activity. Pt is a fall risk due to L foot drop and LE weakness. Pt would benefit from continued acute skilled PT to focus on increasing mobility and endurance. Will plan on BID treatment today. Anticipate d/c home tomorrow after am therapy session.  Follow Up Recommendations  Follow surgeon's recommendation for DC plan and follow-up therapies     Equipment Recommendations  Rolling walker with 5" wheels;Other (comment)(bilateral platforms)    Recommendations for Other Services       Precautions / Restrictions Precautions Precautions: Knee Restrictions Weight Bearing Restrictions: No Other Position/Activity Restrictions: WBAT    Mobility  Bed Mobility Overal bed mobility: Needs Assistance Bed Mobility: Supine to Sit     Supine to sit: Min assist     General bed mobility comments: cues for hand placement, assistance lifting R LE  Transfers Overall transfer level: Needs assistance Equipment used: Rolling walker (2 wheeled) Transfers: Sit to/from Stand Sit to Stand: Mod assist         General transfer comment: cues for hand placement, mod assist from bed, min assist from [facility]  Ambulation/Gait Ambulation/Gait assistance: Min guard Gait Distance (Feet): 80 Feet Assistive device: Bilateral platform walker Gait Pattern/deviations: Step-to pattern;Decreased weight shift to right;Decreased stance time - right Gait velocity: decreased   General Gait Details: left foot drop, has AFO at home, c/o shoulder pain.  requesting to try ambulation later this afternoon without bilateral platforms   Theme park manager mobility: (unable-can [place] w/c)  Modified Rankin (Stroke Patients Only)       Balance Overall balance assessment: No apparent balance deficits (not formally assessed)                                          Cognition Arousal/Alertness: Awake/alert Behavior During Therapy: WFL for tasks assessed/performed Overall Cognitive Status: Within Functional Limits for tasks assessed                                        Exercises Total Joint Exercises Ankle Circles/Pumps: AROM;Strengthening;Both;10 reps;Supine Quad Sets: AROM;Strengthening;Both;10 reps;Supine Heel Slides: AAROM;Strengthening;Right;10 reps;Supine Hip ABduction/ADduction: AAROM;Strengthening;Right;10 reps;Supine Straight Leg Raises: AAROM;Strengthening;Right;10 reps;Supine Long Arc Quad: AAROM;Strengthening;Right;10 reps;Seated Knee Flexion: AAROM;Strengthening;Right;10 reps;Seated Goniometric ROM: 0-40*    General Comments        Pertinent Vitals/Pain Pain Assessment: 0-10 Pain Score: 5  Pain Location: Right knee Pain Descriptors / Indicators: Discomfort Pain Intervention(s): Limited activity within patient's tolerance;Monitored during session    Home Living                      Prior Function            PT Goals (current goals can  now be found in the care plan section) Progress towards PT goals: Progressing toward goals    Frequency    7X/week      PT Plan Current plan remains appropriate    Co-evaluation              AM-PAC PT "6 Clicks" Mobility   Outcome Measure  Help needed turning from your back to your side while in a flat bed without using bedrails?: A Little Help needed moving from lying on your back to sitting on the side of a flat bed without using bedrails?: A Little Help  needed moving to and from a bed to a chair (including a wheelchair)?: A Little Help needed standing up from a chair using your arms (e.g., wheelchair or bedside chair)?: A Little Help needed to walk in hospital room?: A Little Help needed climbing 3-5 steps with a railing? : Total 6 Click Score: 16    End of Session Equipment Utilized During Treatment: Gait belt Activity Tolerance: Patient tolerated treatment well Patient left: in bed;with call bell/phone within reach Nurse Communication: Mobility status PT Visit Diagnosis: Difficulty in walking, not elsewhere classified (R26.2)     Time: 1638-4665 PT Time Calculation (min) (ACUTE ONLY): 26 min  Charges:  $Gait Training: 8-22 mins $Therapeutic Exercise: 8-22 mins                     Theodoro Grist, PT   Lelon Mast 08/25/2018, 10:24 AM

## 2018-08-25 NOTE — Plan of Care (Signed)
Plan of care reviewed and discussed with the patient. 

## 2018-08-25 NOTE — Progress Notes (Signed)
Physical Therapy Treatment Patient Details Name: Jesse Bowers MRN: [id number] DOB: 12-May-1965 Today's Date: 08/25/2018    History of Present Illness Pt is a 53 yo male s/p R TKR.    PT Comments    Pt is making good progress with mobility. Pt is able to ambulate 87f with RW min guard assist. Pt will be ready for d/c home after morning therapy. PT to focus on exercises and safety with RW. Pt to follow-up with HHPT services. Pt will continue to benefit from skilled PT until d/c home.  Follow Up Recommendations  Follow Bowers's recommendation for DC plan and follow-up therapies     Equipment Recommendations  Rolling walker with 5" wheels(bil platforms)    Recommendations for Other Services       Precautions / Restrictions Precautions Precautions: Knee Precaution Comments: Psoriatic Arthritis, L foot drop Restrictions Other Position/Activity Restrictions: WBAT    Mobility  Bed Mobility                  Transfers Overall transfer level: Needs assistance Equipment used: Rolling walker (2 wheeled) Transfers: Sit to/from Stand Sit to Stand: Min assist         General transfer comment: cues for hand placement,  Ambulation/Gait Ambulation/Gait assistance: Min guard Gait Distance (Feet): 90 Feet Assistive device: Rolling walker (2 wheeled) Gait Pattern/deviations: Step-to pattern;Decreased weight shift to right;Decreased stance time - right Gait velocity: decreased   General Gait Details: left foot drop, tried no platforms this afternoon. Pt reported less shoulder pain   Stairs Stairs: Yes       General stair comments: pt has w/c and a ramp at front enterence. Recommend use of w/c and ramp unitl strength improves.   Wheelchair Mobility    Modified Rankin (Stroke Patients Only)       Balance Overall balance assessment: No apparent balance deficits (not formally assessed)                                          Cognition  Arousal/Alertness: Awake/alert Behavior During Therapy: WFL for tasks assessed/performed Overall Cognitive Status: Within Functional Limits for tasks assessed                                        Exercises Total Joint Exercises Ankle Circles/Pumps: AROM;Strengthening;Both;10 reps;Seated Quad Sets: AROM;Strengthening;Right;10 reps;Seated Heel Slides: Strengthening;Right;10 reps;AAROM;Seated Hip ABduction/ADduction: AAROM;Strengthening;Right;10 reps;Seated Straight Leg Raises: AAROM;Strengthening;Right;10 reps;Seated Long Arc Quad: AAROM;Strengthening;Right;10 reps;Seated Knee Flexion: AAROM;Strengthening;Right;10 reps;Seated Goniometric ROM: 0-55*    General Comments        Pertinent Vitals/Pain Pain Assessment: 0-10 Pain Score: 5  Pain Location: Right knee Pain Descriptors / Indicators: Discomfort    Home Living                      Prior Function            PT Goals (current goals can now be found in the care plan section) Progress towards PT goals: Progressing toward goals    Frequency    7X/week      PT Plan Current plan remains appropriate    Co-evaluation              AM-PAC PT "6 Clicks" Mobility   Outcome Measure  Help needed turning from your back  to your side while in a flat bed without using bedrails?: A Little Help needed moving from lying on your back to sitting on the side of a flat bed without using bedrails?: A Little Help needed moving to and from a bed to a chair (including a wheelchair)?: A Little Help needed standing up from a chair using your arms (e.g., wheelchair or bedside chair)?: A Little Help needed to walk in hospital room?: A Little Help needed climbing 3-5 steps with a railing? : A Lot 6 Click Score: 17    End of Session Equipment Utilized During Treatment: Gait belt   Patient left: Other (comment)(bathroom-will use call system) Nurse Communication: Mobility status PT Visit Diagnosis:  Difficulty in walking, not elsewhere classified (R26.2)     Time: 2836-6294 PT Time Calculation (min) (ACUTE ONLY): 24 min  Charges:  $Gait Training: 8-22 mins $Therapeutic Exercise: 8-22 mins                     Theodoro Grist, PT   Lelon Mast 08/25/2018, 2:28 PM

## 2018-08-26 ENCOUNTER — Ambulatory Visit: Payer: Medicare Other | Admitting: Psychology

## 2018-08-26 LAB — CBC
HCT: 30.5 % — ABNORMAL LOW (ref 39.0–52.0)
Hemoglobin: 9.2 g/dL — ABNORMAL LOW (ref 13.0–17.0)
MCH: 24.3 pg — ABNORMAL LOW (ref 26.0–34.0)
MCHC: 30.2 g/dL (ref 30.0–36.0)
MCV: 80.5 fL (ref 80.0–100.0)
Platelets: 176 10*3/uL (ref 150–400)
RBC: 3.79 MIL/uL — ABNORMAL LOW (ref 4.22–5.81)
RDW: 16.3 % — ABNORMAL HIGH (ref 11.5–15.5)
WBC: 6.8 10*3/uL (ref 4.0–10.5)
nRBC: 0 % (ref 0.0–0.2)

## 2018-08-26 NOTE — Plan of Care (Signed)
plan

## 2018-08-26 NOTE — Care Management Important Message (Signed)
Important Message  Patient Details IM Letter given to Velva Harman RN to present to the Patient Name: Jesse Bowers MRN: [id number] Date of Birth: 12-24-1965   Medicare Important Message Given:  Yes     Kerin Salen 08/26/2018, 10:39 AM

## 2018-08-26 NOTE — Progress Notes (Signed)
Physical Therapy Treatment Patient Details Name: Jesse Bowers MRN: [id number] DOB: June 06, 1965 Today's Date: 08/26/2018    History of Present Illness Pt is a 53 yo male s/p R TKR.    PT Comments    Pt ambulated in hallway with RW, slow cautious gait however no LOB.  Pt plans to use w/c to get into home.  Pt performed exercises and reviewed HEP handout.  Pt educated on KI use.  Answered pt's questions within scope of practice and pt ready for d/c home today (pending his hospital bed delivery).  Pt plans to f/u with OP PT tomorrow.   Follow Up Recommendations  Follow surgeon's recommendation for DC plan and follow-up therapies     Equipment Recommendations  Rolling walker with 5" wheels    Recommendations for Other Services       Precautions / Restrictions Precautions Precautions: Knee Precaution Comments: Psoriatic Arthritis, L foot drop Restrictions Other Position/Activity Restrictions: WBAT    Mobility  Bed Mobility Overal bed mobility: Needs Assistance Bed Mobility: Supine to Sit     Supine to sit: Min guard     General bed mobility comments: pt educated on self assist of R LE, provided min/guard of leg per pt request however no physical assist required  Transfers Overall transfer level: Needs assistance Equipment used: Rolling walker (2 wheeled) Transfers: Sit to/from Stand Sit to Stand: Min guard         General transfer comment: verbal cues for safe technique  Ambulation/Gait Ambulation/Gait assistance: Min guard Gait Distance (Feet): 100 Feet Assistive device: Rolling walker (2 wheeled) Gait Pattern/deviations: Step-to pattern;Decreased stance time - right Gait velocity: decreased   General Gait Details: pt very dependent on UEs, slow cautious gait, maintained KI for safety   Stairs             Wheelchair Mobility    Modified Rankin (Stroke Patients Only)       Balance                                             Cognition Arousal/Alertness: Awake/alert Behavior During Therapy: WFL for tasks assessed/performed Overall Cognitive Status: Within Functional Limits for tasks assessed                                        Exercises Total Joint Exercises Ankle Circles/Pumps: AROM;Both;10 reps Quad Sets: AROM;10 reps Heel Slides: Right;10 reps;AAROM;Seated Hip ABduction/ADduction: AAROM;Right;10 reps Straight Leg Raises: AAROM;Right;10 reps Goniometric ROM: R knee approx -6-75*    General Comments        Pertinent Vitals/Pain Pain Assessment: 0-10 Pain Score: 5  Pain Location: Right knee Pain Descriptors / Indicators: Sore;Tightness Pain Intervention(s): Monitored during session;Repositioned;Ice applied    Home Living                      Prior Function            PT Goals (current goals can now be found in the care plan section) Progress towards PT goals: Progressing toward goals    Frequency    7X/week      PT Plan Current plan remains appropriate    Co-evaluation              AM-PAC PT "6 Clicks" Mobility  Outcome Measure  Help needed turning from your back to your side while in a flat bed without using bedrails?: A Little Help needed moving from lying on your back to sitting on the side of a flat bed without using bedrails?: A Little Help needed moving to and from a bed to a chair (including a wheelchair)?: A Little Help needed standing up from a chair using your arms (e.g., wheelchair or bedside chair)?: A Little Help needed to walk in hospital room?: A Little Help needed climbing 3-5 steps with a railing? : A Little 6 Click Score: 18    End of Session Equipment Utilized During Treatment: Gait belt;Right knee immobilizer Activity Tolerance: Patient tolerated treatment well Patient left: in chair;with call bell/phone within reach   PT Visit Diagnosis: Difficulty in walking, not elsewhere classified (R26.2)     Time:  3335-4562 PT Time Calculation (min) (ACUTE ONLY): 32 min  Charges:  $Gait Training: 8-22 mins $Therapeutic Exercise: 8-22 mins                    Carmelia Bake, PT, DPT Acute Rehabilitation Services Office: [phone] Pager: [place] E 08/26/2018, 1:00 PM

## 2018-08-26 NOTE — Progress Notes (Signed)
Subjective: 3 Days Post-Op Procedure(s) (LRB): TOTAL KNEE ARTHROPLASTY (Right) Patient reports pain as mild.   Patient seen in rounds by Dr. Wynelle Link. Patient is well, and has had no acute complaints or problems. States he feels ready to go home. Denies chest pain, SOB, or calf pain. Voiding without difficulty and positive flatus. No issues overnight.  Plan is to go Home after hospital stay.  Objective: Vital signs in last 24 hours: Temp:  [98.1 F (36.7 C)-99 F (37.2 C)] 98.1 F (36.7 C) (06/25 0544) Pulse Rate:  [107-109] 107 (06/25 0544) Resp:  [14-16] 16 (06/25 0544) BP: (142-153)/(83-84) 142/83 (06/25 0544) SpO2:  [100 %] 100 % (06/25 0544)  Intake/Output from previous day:  Intake/Output Summary (Last 24 hours) at 08/26/2018 0757 Last data filed at 08/25/2018 2135 Gross per 24 hour  Intake 600 ml  Output -  Net 600 ml    Intake/Output this shift: No intake/output data recorded.  Labs: Recent Labs    08/24/18 0311 08/25/18 0305 08/26/18 0301  HGB 10.2* 9.3* 9.2*   Recent Labs    08/25/18 0305 08/26/18 0301  WBC 7.9 6.8  RBC 3.84* 3.79*  HCT 31.4* 30.5*  PLT 148* 176   Recent Labs    08/24/18 0311 08/25/18 0305  NA 135 133*  K 3.9 3.6  CL 101 99  CO2 25 23  BUN 19 13  CREATININE 0.96 0.84  GLUCOSE 178* 138*  CALCIUM 8.3* 8.1*   No results for input(s): LABPT, INR in the last 72 hours.  Exam: General - Patient is Alert and Oriented Extremity - Neurologically intact Neurovascular intact Sensation intact distally Dorsiflexion/Plantar flexion intact Dressing/Incision - clean, dry, no drainage Motor Function - intact, moving foot and toes well on exam.   Past Medical History:  Diagnosis Date  . Abnormal weight loss   . Anxiety   . Arthritis   . Celiac disease   . Cervical neck pain with evidence of disc disease    patient has a cyst   . Chronic constipation   . Chronic diastolic heart failure ([place])    Pt. denies  . Chronic pain   .  Degenerative disc disease at L5-S1 level    with stenosis  . DVT (deep venous thrombosis) ([facility])    Right upper arm, bilateral leg  . Eczema    inguinal, feet  . Elevated liver enzymes   . 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 Family history of adverse reaction to anesthesia    family has problems with anesthesia of nausea and vomiting   . 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 GERD (gastroesophageal reflux disease)    History of in 20's  . Gluten enteropathy   . H/O parotitis    right   . Hard of hearing   . History of kidney stones   . History of retinal tear    Bilateral  . History of staph infection    required wound vac  . Hx-TIA (transient ischemic attack)    2015  . LVH (left ventricular hypertrophy) 12/15/2016   Mild, noted on ECHO  . MVP (mitral valve prolapse)   . NAFL (nonalcoholic fatty liver)   . Neck pain   . Neuromuscular disorder ([place])    bilateral neuropathy feet.  . Pneumonia 12/17/2010  . Polycythemia   . Polycythemia, secondary   . PONV (postoperative nausea and vomiting)   . Protein C deficiency ([place])    Dr. Anne Fu  . Psoriasis  16 X10 cm psoriatic rash on sole of left foot ; open and occ scant bleeding;   . psoriatic arthritis   . PTSD (post-traumatic stress disorder)   . Scaphoid fracture of wrist 09/23/2013  . Seizure ([place])    childhood, medication until age 63 then weanned completely off  . Sleep apnea    split night study last done by Dr. [name] 06/18/15 shows severe OSA, CSA, and hypersomnia, rec bipap  . Splenomegaly   . Stenosis of ureteropelvic junction (UPJ)    left  . 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 [name] follows  . 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 Tachycardia    hx of   . Transfusion history    past history- none recent, after surgeries due to blood loss  . Wears glasses   . Wears hearing aid     Assessment/Plan: 3 Days Post-Op  Procedure(s) (LRB): TOTAL KNEE ARTHROPLASTY (Right) Principal Problem:   OA (osteoarthritis) of knee Active Problems:   Osteoarthritis of right knee  Estimated body mass index is 32.81 kg/m as calculated from the following:   Height as of this encounter: 5' 9"  (1.753 m).   Weight as of this encounter: 100.8 kg. Up with therapy D/C IV fluids  DVT Prophylaxis - Xarelto Weight-bearing as tolerated  Hemoglobin stable at 9.2  Plan for discharge to home after one session of therapy this AM.  Scheduled for OP PT at [facility] Follow-up in the office in 2 weeks.   Theresa Duty, PA-C Orthopedic Surgery 08/26/2018, 7:57 AM

## 2018-08-26 NOTE — Progress Notes (Signed)
Patient discharged via w/c to home with wife.  Denies any questions re: discharge instructions, equipment, or PT follow-up.

## 2018-08-30 ENCOUNTER — Telehealth: Payer: Self-pay

## 2018-08-30 NOTE — Discharge Summary (Signed)
Physician Discharge Summary   Patient ID: Jesse Bowers MRN: [id number] DOB/AGE: 1965/11/18 53 y.o.  Admit date: 08/23/2018 Discharge date: 08/26/2018  Primary Diagnosis: Osteoarthritis, right knee   Admission Diagnoses:  Past Medical History:  Diagnosis Date   Abnormal weight loss    Anxiety    Arthritis    Celiac disease    Cervical neck pain with evidence of disc disease    patient has a cyst    Chronic constipation    Chronic diastolic heart failure (HCC)    Pt. 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   Family history of adverse reaction to anesthesia    family has problems with anesthesia of nausea and vomiting    Male-to-male transgender person    GERD (gastroesophageal reflux disease)    History of in 20's   Gluten enteropathy    H/O parotitis    right    Hard of hearing    History of kidney stones    History of retinal tear    Bilateral   History of staph infection    required wound vac   Hx-TIA (transient ischemic attack)    2015   LVH (left ventricular hypertrophy) 12/15/2016   Mild, noted on ECHO   MVP (mitral valve prolapse)    NAFL (nonalcoholic fatty liver)    Neck pain    Neuromuscular disorder ([place])    bilateral neuropathy feet.   Pneumonia 12/17/2010   Polycythemia    Polycythemia, secondary    PONV (postoperative nausea and vomiting)    Protein C deficiency (HCC)    Dr. Anne Fu   Psoriasis    16 X10 cm psoriatic rash on sole of left foot ; open and occ scant bleeding;    psoriatic arthritis    PTSD (post-traumatic stress disorder)    Scaphoid fracture of wrist 09/23/2013   Seizure ([place])    childhood, medication until age 36 then weanned completely off   Sleep apnea    split  night study last done by Dr. 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 [name] follows   Syrinx of spinal cord ([place]) 01/06/2014   c spine on MRI   Tachycardia    hx of    Transfusion history    past history- none recent, after surgeries due to blood loss   Wears glasses    Wears hearing aid    Discharge Diagnoses:   Principal Problem:   OA (osteoarthritis) of knee Active Problems:   Osteoarthritis of right knee  Estimated body mass index is 32.81 kg/m as calculated from the following:   Height as of this encounter: 5' 9"  (1.753 m).   Weight as of this encounter: 100.8 kg.  Procedure:  Procedure(s) (LRB): TOTAL KNEE ARTHROPLASTY (Right)   Consults: None  HPI: Jesse Bowers is a 53 y.o. year old adult with end stage OA of his right knee with progressively worsening pain and dysfunction. He has constant pain, with activity and at rest and significant functional deficits with  difficulties even with ADLs. He has had extensive non-op management including analgesics, injections of cortisone and viscosupplements, and home exercise program, but remains in significant pain with significant dysfunction. Radiographs show advanced arthritic changes patellofemoral compartment. He presents now for right Total Knee Arthroplasty  Laboratory Data: Admission on 08/23/2018, Discharged on 08/26/2018  Component Date Value Ref Range Status   WBC 08/24/2018 6.9  4.0 - 10.5 K/uL Final   RBC 08/24/2018 4.33  4.22 - 5.81 MIL/uL Final   Hemoglobin 08/24/2018 10.2* 13.0 - 17.0 g/dL Final   HCT 08/24/2018 34.5* 39.0 - 52.0 % Final   MCV 08/24/2018 79.7* 80.0 - 100.0 fL Final   MCH 08/24/2018 23.6* 26.0 - 34.0 pg Final   MCHC 08/24/2018 29.6* 30.0 - 36.0 g/dL Final   RDW 08/24/2018 15.3  11.5 - 15.5 % Final    Platelets 08/24/2018 160  150 - 400 K/uL Final   nRBC 08/24/2018 0.0  0.0 - 0.2 % Final   Performed at [facility], [place] [address], [place], [place] [address]   Sodium 08/24/2018 135  135 - 145 mmol/L Final   Potassium 08/24/2018 3.9  3.5 - 5.1 mmol/L Final   Chloride 08/24/2018 101  98 - 111 mmol/L Final   CO2 08/24/2018 25  22 - 32 mmol/L Final   Glucose, Bld 08/24/2018 178* 70 - 99 mg/dL Final   BUN 08/24/2018 19  6 - 20 mg/dL Final   Creatinine, Ser 08/24/2018 0.96  0.61 - 1.24 mg/dL Final   Calcium 08/24/2018 8.3* 8.9 - 10.3 mg/dL Final   GFR calc non Af Amer 08/24/2018 >60  >60 mL/min Final   GFR calc Af Amer 08/24/2018 >60  >60 mL/min Final   Anion gap 08/24/2018 9  5 - 15 Final   Performed at [facility], [place] [address], [place], [place] [address]   WBC 08/25/2018 7.9  4.0 - 10.5 K/uL Final   RBC 08/25/2018 3.84* 4.22 - 5.81 MIL/uL Final   Hemoglobin 08/25/2018 9.3* 13.0 - 17.0 g/dL Final   HCT 08/25/2018 31.4* 39.0 - 52.0 % Final   MCV 08/25/2018 81.8  80.0 - 100.0 fL Final   MCH 08/25/2018 24.2* 26.0 - 34.0 pg Final   MCHC 08/25/2018 29.6* 30.0 - 36.0 g/dL Final   RDW 08/25/2018 15.9* 11.5 - 15.5 % Final   Platelets 08/25/2018 148* 150 - 400 K/uL Final   nRBC 08/25/2018 0.0  0.0 - 0.2 % Final   Performed at [facility], [place] [address] [place], [place] [address]   Sodium 08/25/2018 133* 135 - 145 mmol/L Final   Potassium 08/25/2018 3.6  3.5 - 5.1 mmol/L Final   Chloride 08/25/2018 99  98 - 111 mmol/L Final   CO2 08/25/2018 23  22 - 32 mmol/L Final   Glucose, Bld 08/25/2018 138* 70 - 99 mg/dL Final   BUN 08/25/2018 13  6 - 20 mg/dL Final   Creatinine, Ser 08/25/2018 0.84  0.61 - 1.24 mg/dL Final   Calcium 08/25/2018 8.1* 8.9 - 10.3 mg/dL Final   GFR calc non Af Amer 08/25/2018 >60  >60 mL/min Final   GFR calc Af Amer 08/25/2018 >60  >60 mL/min Final   Anion gap 08/25/2018 11  5 -  15 Final   Performed at [facility], [place] [address], [place], [place] [address]   WBC 08/26/2018 6.8  4.0 - 10.5 K/uL Final   RBC 08/26/2018 3.79* 4.22 - 5.81 MIL/uL Final  Hemoglobin 08/26/2018 9.2* 13.0 - 17.0 g/dL Final   HCT 08/26/2018 30.5* 39.0 - 52.0 % Final   MCV 08/26/2018 80.5  80.0 - 100.0 fL Final   MCH 08/26/2018 24.3* 26.0 - 34.0 pg Final   MCHC 08/26/2018 30.2  30.0 - 36.0 g/dL Final   RDW 08/26/2018 16.3* 11.5 - 15.5 % Final   Platelets 08/26/2018 176  150 - 400 K/uL Final   nRBC 08/26/2018 0.0  0.0 - 0.2 % Final   Performed at [facility], [place] [address], [place], [place] [address]  Hospital Outpatient Visit on 08/19/2018  Component Date Value Ref Range Status   SARS Coronavirus 2 08/19/2018 NEGATIVE  NEGATIVE Final   Comment: (NOTE) SARS-CoV-2 target nucleic acids are NOT DETECTED. The SARS-CoV-2 RNA is generally detectable in upper and lower respiratory specimens during the acute phase of infection. Negative results do not preclude SARS-CoV-2 infection, do not rule out co-infections with other pathogens, and should not be used as the sole basis for treatment or other patient management decisions. Negative results must be combined with clinical observations, patient history, and epidemiological information. The expected result is Negative. Fact Sheet for Patients: TrashEliminator.se Fact Sheet for Healthcare Providers: WhoisBlogging.ch This test is not yet approved or cleared by the [place] FDA and  has been authorized for detection and/or diagnosis of SARS-CoV-2 by FDA under an Emergency Use Authorization (EUA). This EUA will remain  in effect (meaning this test can be used) for the duration of the COVID-19 declaration under Section 56                          4(b)(1) of the Act, 21 U.S.C. section 360bbb-3(b)(1), unless the authorization is terminated  or revoked sooner. Performed at [facility], [place] [address] [place], [place] [address]   Hospital Outpatient Visit on 08/18/2018  Component Date Value Ref Range Status   aPTT 08/18/2018 27  24 - 36 seconds Final   Performed at [facility], [place] [address], [place], [place] [address]   WBC 08/18/2018 4.2  4.0 - 10.5 K/uL Final   RBC 08/18/2018 5.47  4.22 - 5.81 MIL/uL Final   Hemoglobin 08/18/2018 13.2  13.0 - 17.0 g/dL Final   HCT 08/18/2018 43.3  39.0 - 52.0 % Final   MCV 08/18/2018 79.2* 80.0 - 100.0 fL Final   MCH 08/18/2018 24.1* 26.0 - 34.0 pg Final   MCHC 08/18/2018 30.5  30.0 - 36.0 g/dL Final   RDW 08/18/2018 15.9* 11.5 - 15.5 % Final   Platelets 08/18/2018 196  150 - 400 K/uL Final   nRBC 08/18/2018 0.0  0.0 - 0.2 % Final   Performed at [facility], [place] [address] [place], [place] [address]   Sodium 08/18/2018 139  135 - 145 mmol/L Final   Potassium 08/18/2018 4.0  3.5 - 5.1 mmol/L Final   Chloride 08/18/2018 102  98 - 111 mmol/L Final   CO2 08/18/2018 26  22 - 32 mmol/L Final   Glucose, Bld 08/18/2018 115* 70 - 99 mg/dL Final   BUN 08/18/2018 17  6 - 20 mg/dL Final   Creatinine, Ser 08/18/2018 1.08  0.61 - 1.24 mg/dL Final   Calcium 08/18/2018 8.9  8.9 - 10.3 mg/dL Final   Total Protein 08/18/2018 7.1  6.5 - 8.1 g/dL Final   Albumin 08/18/2018 4.4  3.5 - 5.0 g/dL Final   AST 08/18/2018 28  15 -  41 U/L Final   ALT 08/18/2018 22  0 - 44 U/L Final   Alkaline Phosphatase 08/18/2018 169* 38 - 126 U/L Final   Total Bilirubin 08/18/2018 0.4  0.3 - 1.2 mg/dL Final   GFR calc non Af Amer 08/18/2018 >60  >60 mL/min Final   GFR calc Af Amer 08/18/2018 >60  >60 mL/min Final   Anion gap 08/18/2018 11  5 - 15 Final   Performed at [facility], [place] [address] [place], [place] [address]   Prothrombin Time 08/18/2018 14.0  11.4 - 15.2 seconds Final   INR 08/18/2018 1.1  0.8 - 1.2 Final    Comment: (NOTE) INR goal varies based on device and disease states. Performed at [facility], [place] [address], [place], [place] [address]    ABO/RH(D) 08/18/2018 A POS   Final   Antibody Screen 08/18/2018 NEG   Final   Sample Expiration 08/18/2018 08/26/2018,2359   Final   Extend sample reason 08/18/2018    Final                   Value:NO TRANSFUSIONS OR PREGNANCY IN THE PAST 3 MONTHS Performed at [facility], [place] [address], [place], [place] [address]    MRSA, PCR 08/18/2018 NEGATIVE  NEGATIVE Final   Staphylococcus aureus 08/18/2018 NEGATIVE  NEGATIVE Final   Comment: (NOTE) The Xpert SA Assay (FDA approved for NASAL specimens in patients 42 years of age and older), is one component of a comprehensive surveillance program. It is not intended to diagnose infection nor to guide or monitor treatment. Performed at [facility], [place] [address], [place], [place] [address]      X-Rays:No results found.  EKG: Orders placed or performed in visit on 05/07/18   EKG 12-Lead     Hospital Course: Jesse Bowers is a 53 y.o. who was admitted to [facility]. They were brought to the operating room on 08/23/2018 and underwent Procedure(s): TOTAL KNEE ARTHROPLASTY.  Patient tolerated the procedure well and was later transferred to the recovery room and then to the orthopaedic floor for postoperative care. They were given PO and IV analgesics for pain control following their surgery. They were given 24 hours of postoperative antibiotics of  Anti-infectives (From admission, onward)   Start     Dose/Rate Route Frequency Ordered Stop   08/23/18 1900  clindamycin (CLEOCIN) IVPB 600 mg     600 mg 100 mL/hr over 30 Minutes Intravenous Every 6 hours 08/23/18 1731 08/24/18 0230   08/23/18 0915  clindamycin (CLEOCIN) IVPB 900 mg     900 mg 100 mL/hr over 30 Minutes Intravenous On call to O.R. 08/23/18 8416 08/23/18 1145     and  started on DVT prophylaxis in the form of Xarelto.   PT and OT were ordered for total joint protocol. Discharge planning consulted to help with postop disposition and equipment needs. Patient had a decent night on the evening of surgery. They started to get up OOB with therapy on POD #1. Hemovac drain was pulled without difficulty on day one. Continued to work with therapy into POD #2. Dressing was changed on day two and the incision was clean, dry, and intact with no drainage. On POD #3, patient was seen during rounds and was ready to go home. Incision was healing well. Hemoglobin was stable at 9.2. Pt worked with therapy for one additional session and was meeting their goals. He was discharged to home later  that day in stable condition.  Diet: Cardiac diet Activity: WBAT Follow-up: in 2 weeks Disposition: Home with outpatient PT at [facility] Discharged Condition: stable   Discharge Instructions    Call MD / Call 911   Complete by: As directed    If you experience chest pain or shortness of breath, CALL 911 and be transported to the hospital emergency room.  If you develope a fever above 101 F, pus (white drainage) or increased drainage or redness at the wound, or calf pain, call your surgeon's office.   Change dressing   Complete by: As directed    Change the dressing daily with sterile 4 x 4 inch gauze dressing and apply TED hose.   Constipation Prevention   Complete by: As directed    Drink plenty of fluids.  Prune juice may be helpful.  You may use a stool softener, such as Colace (over the counter) 100 mg twice a day.  Use MiraLax (over the counter) for constipation as needed.   Diet - low sodium heart healthy   Complete by: As directed    Discharge instructions   Complete by: As directed    Dr. [name] Total Joint Specialist [facility] [address], [place], [place] [address] [phone]  TOTAL KNEE REPLACEMENT POSTOPERATIVE DIRECTIONS  Knee  Rehabilitation, Guidelines Following Surgery  Results after knee surgery are often greatly improved when you follow the exercise, range of motion and muscle strengthening exercises prescribed by your doctor. Safety measures are also important to protect the knee from further injury. Any time any of these exercises cause you to have increased pain or swelling in your knee joint, decrease the amount until you are comfortable again and slowly increase them. If you have problems or questions, call your caregiver or physical therapist for advice.   HOME CARE INSTRUCTIONS  Remove items at home which could result in a fall. This includes throw rugs or furniture in walking pathways.  ICE to the affected knee every three hours for 30 minutes at a time and then as needed for pain and swelling.  Continue to use ice on the knee for pain and swelling from surgery. You may notice swelling that will progress down to the foot and ankle.  This is normal after surgery.  Elevate the leg when you are not up walking on it.   Continue to use the breathing machine which will help keep your temperature down.  It is common for your temperature to cycle up and down following surgery, especially at night when you are not up moving around and exerting yourself.  The breathing machine keeps your lungs expanded and your temperature down. Do not place pillow under knee, focus on keeping the knee straight while resting   DIET You may resume your previous home diet once your are discharged from the hospital.  DRESSING / WOUND CARE / SHOWERING You may change your dressing 3-5 days after surgery.  Then change the dressing every day with sterile gauze.  Please use good hand washing techniques before changing the dressing.  Do not use any lotions or creams on the incision until instructed by your surgeon. You may start showering once you are discharged home but do not submerge the incision under water. Just pat the incision dry and apply  a dry gauze dressing on daily. Change the surgical dressing daily and reapply a dry dressing each time.  ACTIVITY Walk with your walker as instructed. Use walker as long as suggested  by your caregivers. Avoid periods of inactivity such as sitting longer than an hour when not asleep. This helps prevent blood clots.  You may resume a sexual relationship in one month or when given the OK by your doctor.  You may return to work once you are cleared by your doctor.  Do not drive a car for 6 weeks or until released by you surgeon.  Do not drive while taking narcotics.  WEIGHT BEARING Weight bearing as tolerated with assist device (walker, cane, etc) as directed, use it as long as suggested by your surgeon or therapist, typically at least 4-6 weeks.  POSTOPERATIVE CONSTIPATION PROTOCOL Constipation - defined medically as fewer than three stools per week and severe constipation as less than one stool per week.  One of the most common issues patients have following surgery is constipation.  Even if you have a regular bowel pattern at home, your normal regimen is likely to be disrupted due to multiple reasons following surgery.  Combination of anesthesia, postoperative narcotics, change in appetite and fluid intake all can affect your bowels.  In order to avoid complications following surgery, here are some recommendations in order to help you during your recovery period.  Colace (docusate) - Pick up an over-the-counter form of Colace or another stool softener and take twice a day as long as you are requiring postoperative pain medications.  Take with a full glass of water daily.  If you experience loose stools or diarrhea, hold the colace until you stool forms back up.  If your symptoms do not get better within 1 week or if they get worse, check with your doctor.  Dulcolax (bisacodyl) - Pick up over-the-counter and take as directed by the product packaging as needed to assist with the movement of your  bowels.  Take with a full glass of water.  Use this product as needed if not relieved by Colace only.   MiraLax (polyethylene glycol) - Pick up over-the-counter to have on hand.  MiraLax is a solution that will increase the amount of water in your bowels to assist with bowel movements.  Take as directed and can mix with a glass of water, juice, soda, coffee, or tea.  Take if you go more than two days without a movement. Do not use MiraLax more than once per day. Call your doctor if you are still constipated or irregular after using this medication for 7 days in a row.  If you continue to have problems with postoperative constipation, please contact the office for further assistance and recommendations.  If you experience "the worst abdominal pain ever" or develop nausea or vomiting, please contact the office immediatly for further recommendations for treatment.  ITCHING  If you experience itching with your medications, try taking only a single pain pill, or even half a pain pill at a time.  You can also use Benadryl over the counter for itching or also to help with sleep.   TED HOSE STOCKINGS Wear the elastic stockings on both legs for three weeks following surgery during the day but you may remove then at night for sleeping.  MEDICATIONS See your medication summary on the "After Visit Summary" that the nursing staff will review with you prior to discharge.  You may have some home medications which will be placed on hold until you complete the course of blood thinner medication.  It is important for you to complete the blood thinner medication as prescribed by your surgeon.  Continue your approved medications  as instructed at time of discharge.  PRECAUTIONS If you experience chest pain or shortness of breath - call 911 immediately for transfer to the hospital emergency department.  If you develop a fever greater that 101 F, purulent drainage from wound, increased redness or drainage from wound,  foul odor from the wound/dressing, or calf pain - CONTACT YOUR SURGEON.                                                   FOLLOW-UP APPOINTMENTS Make sure you keep all of your appointments after your operation with your surgeon and caregivers. You should call the office at the above phone number and make an appointment for approximately two weeks after the date of your surgery or on the date instructed by your surgeon outlined in the "After Visit Summary".   RANGE OF MOTION AND STRENGTHENING EXERCISES  Rehabilitation of the knee is important following a knee injury or an operation. After just a few days of immobilization, the muscles of the thigh which control the knee become weakened and shrink (atrophy). Knee exercises are designed to build up the tone and strength of the thigh muscles and to improve knee motion. Often times heat used for twenty to thirty minutes before working out will loosen up your tissues and help with improving the range of motion but do not use heat for the first two weeks following surgery. These exercises can be done on a training (exercise) mat, on the floor, on a table or on a bed. Use what ever works the best and is most comfortable for you Knee exercises include:  Leg Lifts - While your knee is still immobilized in a splint or cast, you can do straight leg raises. Lift the leg to 60 degrees, hold for 3 sec, and slowly lower the leg. Repeat 10-20 times 2-3 times daily. Perform this exercise against resistance later as your knee gets better.  Quad and Hamstring Sets - Tighten up the muscle on the front of the thigh (Quad) and hold for 5-10 sec. Repeat this 10-20 times hourly. Hamstring sets are done by pushing the foot backward against an object and holding for 5-10 sec. Repeat as with quad sets.  Leg Slides: Lying on your back, slowly slide your foot toward your buttocks, bending your knee up off the floor (only go as far as is comfortable). Then slowly slide your foot back  down until your leg is flat on the floor again. Angel Wings: Lying on your back spread your legs to the side as far apart as you can without causing discomfort.  A rehabilitation program following serious knee injuries can speed recovery and prevent re-injury in the future due to weakened muscles. Contact your doctor or a physical therapist for more information on knee rehabilitation.   IF YOU ARE TRANSFERRED TO A SKILLED REHAB FACILITY If the patient is transferred to a skilled rehab facility following release from the hospital, a list of the current medications will be sent to the facility for the patient to continue.  When discharged from the skilled rehab facility, please have the facility set up the patient's [place] prior to being released. Also, the skilled facility will be responsible for providing the patient with their medications at time of release from the facility to include their pain medication, the muscle relaxants,  and their blood thinner medication. If the patient is still at the rehab facility at time of the two week follow up appointment, the skilled rehab facility will also need to assist the patient in arranging follow up appointment in our office and any transportation needs.  MAKE SURE YOU:  Understand these instructions.  Get help right away if you are not doing well or get worse.    Pick up stool softner and laxative for home use following surgery while on pain medications. Do not submerge incision under water. Please use good hand washing techniques while changing dressing each day. May shower starting three days after surgery. Please use a clean towel to pat the incision dry following showers. Continue to use ice for pain and swelling after surgery. Do not use any lotions or creams on the incision until instructed by your surgeon.   Do not put a pillow under the knee. Place it under the heel.   Complete by: As directed    Driving restrictions    Complete by: As directed    No driving for two weeks   TED hose   Complete by: As directed    Use stockings (TED hose) for three weeks on both leg(s).  You may remove them at night for sleeping.   Weight bearing as tolerated   Complete by: As directed      Allergies as of 08/26/2018      Reactions   Gabapentin Nausea Only, Other (See Comments)   Other reaction(s): nausea, mental status Drowsiness and restlessness. Pt. States, " It makes me crazy, I can't take this medicine."   Penicillin G Anaphylaxis, Other (See Comments)   Has patient had a PCN reaction causing immediate rash, facial/tongue/throat swelling, SOB or lightheadedness with hypotension: Yes Has patient had a PCN reaction causing severe rash involving mucus membranes or skin necrosis: No Has patient had a PCN reaction that required hospitalization Yes Has patient had a PCN reaction occurring within the last 10 years: No If all of the above answers are "NO", then may proceed with Cephalosporin use.   Sulfa Antibiotics Rash   Stevens-Johnson rash   Tegaderm Chg Dressing  [chlorhexidine] Swelling, Itching   Vancomycin Rash, Other (See Comments)   RED MAN SYNDROME CAN HAVE IF GIVEN OVER 2HOURS   Duloxetine Other (See Comments)   Restless legs   Tegaderm Ag Mesh [silver] Dermatitis   Causes blistering wounds   Citalopram Other (See Comments)   Dystonia   Wheat Bran    Due to Celiac   Ibuprofen Other (See Comments)   Contraindicated with Xarelto.    Nortriptyline Other (See Comments)   Dry mouth at 25 mg dose.  Tolerates 10 mg dose   Pregabalin Other (See Comments)   Ineffective   Sulfacetamide Sodium-sulfur Rash      Medication List    STOP taking these medications   diclofenac sodium 1 % Gel Commonly known as: VOLTAREN   NONFORMULARY OR COMPOUNDED ITEM     TAKE these medications   acyclovir ointment 5 % Commonly known as: ZOVIRAX Apply 1 application topically daily as needed (cold sores).     antiseptic oral rinse Liqd 15 mLs by Mouth Rinse route as needed for dry mouth.   augmented betamethasone dipropionate 0.05 % ointment Commonly known as: DIPROLENE-AF Apply 1 application topically 2 (two) times daily.   B-D 3CC LUER-LOK SYR 18GX1-1/2 18G X 1-1/2" 3 ML Misc Generic drug: SYRINGE-NEEDLE (DISP) 3 ML BD Luer-Lok Syringe 3 mL 18  x 1 1/2"  USE ONCE A WEEK   baclofen 20 MG tablet Commonly known as: LIORESAL Take 1 tablet (20 mg total) by mouth 4 (four) times daily.   CeraVe Crea Apply 1 application topically 2 (two) times a day.   clobetasol 0.05 % Gel Commonly known as: TEMOVATE Apply 1 application topically 2 (two) times daily as needed (itching).   clorazepate 7.5 MG tablet Commonly known as: TRANXENE Take 7.5 mg by mouth 2 (two) times daily.   clorazepate 15 MG tablet Commonly known as: TRANXENE Take 15 mg by mouth at bedtime.   Cosentyx Sensoready Pen 150 MG/ML Soaj Generic drug: Secukinumab Inject 300 mg into the skin every 28 (twenty-eight) days.   desonide 0.05 % ointment Commonly known as: DESOWEN Apply 1 application topically 2 (two) times daily as needed (psoriasis).   Enstilar 0.005-0.064 % Foam Generic drug: Calcipotriene-Betameth Diprop Apply 1 application topically 2 (two) times daily.   Fluocinolone Acetonide 0.01 % Oil Place 3 drops into both ears 2 (two) times daily as needed for itching.   furosemide 40 MG tablet Commonly known as: LASIX Take 40 mg by mouth 2 (two) times daily as needed for fluid or edema.   furosemide 20 MG tablet Commonly known as: LASIX Take 20 mg by mouth daily as needed for fluid or edema. AS NEEDED FOR WEIGHT GAIN OF 3LBS IN 1DAY OR 5LBS/WEEK   methocarbamol 500 MG tablet Commonly known as: ROBAXIN Take 1 tablet (500 mg total) by mouth every 6 (six) hours as needed for muscle spasms.   mirabegron ER 25 MG Tb24 tablet Commonly known as: MYRBETRIQ Take 1 tablet (25 mg total) by mouth daily.   morphine 60  MG 24 hr capsule Commonly known as: Kadian Take 1 capsule (60 mg total) by mouth every 12 (twelve) hours.   NEEDLE (DISP) 18 G 18G X 1-1/2" Misc 1 Units by Does not apply route once a week.   NEEDLE (DISP) 23 G 23G X 1" Misc Hypodermic Needles 23 gauge x 1"  USE ONCE A WEEK   NEEDLE (DISP) 23 G 23G X 3/4" Misc 1 Units by Does not apply route once a week.   neomycin-polymyxin-hydrocortisone 3.5-10000-1 OTIC suspension Commonly known as: CORTISPORIN Place 4 drops into both ears 2 (two) times daily as needed (ear pain).   oxyCODONE 5 MG immediate release tablet Commonly known as: Oxy IR/ROXICODONE Take 1-3 tablets (5-15 mg total) by mouth every 6 (six) hours as needed for severe pain. What changed:   medication strength  how much to take  when to take this  reasons to take this   potassium chloride 10 MEQ tablet Commonly known as: K-DUR TAKE K-DUR 2 TABLETS 10 MEQ DAILY What changed:   how much to take  how to take this  when to take this  additional instructions   prazosin 2 MG capsule Commonly known as: MINIPRESS Take 4 mg by mouth at bedtime.   rivaroxaban 20 MG Tabs tablet Commonly known as: Xarelto TAKE 1 TABLET DAILY BEFORE BEDTIME What changed:   how much to take  how to take this  when to take this   Syringe (Disposable) 1 ML Misc 1 Units by Does not apply route once a week.   Systane Complete 0.6 % Soln Generic drug: Propylene Glycol Place 1-2 drops into both eyes 4 (four) times daily as needed (dry eyes).   testosterone cypionate 200 MG/ML injection Commonly known as: DEPOTESTOSTERONE CYPIONATE INJECT 0.5 MLS (100 MG TOTAL) INTO  THE MUSCLE ONCE A WEEK.   ursodiol 500 MG tablet Commonly known as: ACTIGALL Take 500 mg by mouth 2 (two) times daily with a meal.   ursodiol 250 MG tablet Commonly known as: ACTIGALL Take 250 mg by mouth 2 (two) times daily with a meal.   venlafaxine XR 150 MG 24 hr capsule Commonly known as:  EFFEXOR-XR Take 2 capsules (300 mg total) by mouth daily with breakfast.            Discharge Care Instructions  (From admission, onward)         Start     Ordered   08/24/18 0000  Weight bearing as tolerated     08/24/18 0739   08/24/18 0000  Change dressing    Comments: Change the dressing daily with sterile 4 x 4 inch gauze dressing and apply TED hose.   08/24/18 0739         Follow-up Information    [name], MD. Schedule an appointment as soon as possible for a visit on 09/07/2018.   Specialty: Orthopedic Surgery Contact information: [address] [place] [address] [phone]           Signed: Theresa Duty, PA-C Orthopedic Surgery 08/30/2018, 10:22 AM

## 2018-08-30 NOTE — Telephone Encounter (Signed)
Patient called stating he had a knee replacement on June 22 nd and was given Oxycodone 5 mg. He is taken his medication has directed and he is still in a lot of pain. Please advise.

## 2018-08-31 ENCOUNTER — Ambulatory Visit: Payer: Self-pay | Admitting: Psychology

## 2018-08-31 ENCOUNTER — Telehealth: Payer: Self-pay | Admitting: Hematology

## 2018-08-31 NOTE — Telephone Encounter (Signed)
Message from patient.

## 2018-08-31 NOTE — Telephone Encounter (Signed)
I called to clarify what Jesse Bowers was asking for.  He has his rx from Jesse Bowers filled 08/25/18 #56 oxy 5 mg, and has an Rx at the pharmacy to fill his Morphine Sulfate ER 60 mg #60 to be filled now. His oxy IR 5 mg can be taken 1-3 tabs q 6 hours as needed per Jesse Bowers. I reviewed we will not be prescribing his oxycodone until he is released from Jesse Bowers. He says he just wanted Jesse Bowers to be aware of what he is taking because his medications got out of sync and messed up the last surgery. Noted.

## 2018-09-01 ENCOUNTER — Other Ambulatory Visit: Payer: Self-pay | Admitting: Physical Medicine & Rehabilitation

## 2018-09-01 ENCOUNTER — Ambulatory Visit: Payer: Medicare Other | Admitting: Physical Medicine & Rehabilitation

## 2018-09-01 DIAGNOSIS — L4050 Arthropathic psoriasis, unspecified: Secondary | ICD-10-CM

## 2018-09-02 ENCOUNTER — Ambulatory Visit: Payer: Medicare Other | Admitting: Psychology

## 2018-09-03 ENCOUNTER — Ambulatory Visit (INDEPENDENT_AMBULATORY_CARE_PROVIDER_SITE_OTHER): Payer: Medicare Other | Admitting: Psychology

## 2018-09-03 DIAGNOSIS — F4312 Post-traumatic stress disorder, chronic: Secondary | ICD-10-CM | POA: Diagnosis not present

## 2018-09-07 ENCOUNTER — Ambulatory Visit: Payer: BLUE CROSS/BLUE SHIELD | Admitting: Psychology

## 2018-09-09 ENCOUNTER — Encounter: Payer: Self-pay | Admitting: Registered Nurse

## 2018-09-09 ENCOUNTER — Ambulatory Visit: Payer: Self-pay | Admitting: Psychology

## 2018-09-09 ENCOUNTER — Encounter: Payer: Medicare Other | Attending: Physical Medicine & Rehabilitation | Admitting: Registered Nurse

## 2018-09-09 ENCOUNTER — Other Ambulatory Visit: Payer: Self-pay

## 2018-09-09 VITALS — BP 119/71 | HR 112 | Temp 98.0°F

## 2018-09-09 DIAGNOSIS — Z86718 Personal history of other venous thrombosis and embolism: Secondary | ICD-10-CM | POA: Insufficient documentation

## 2018-09-09 DIAGNOSIS — Z79891 Long term (current) use of opiate analgesic: Secondary | ICD-10-CM | POA: Insufficient documentation

## 2018-09-09 DIAGNOSIS — M13862 Other specified arthritis, left knee: Secondary | ICD-10-CM | POA: Insufficient documentation

## 2018-09-09 DIAGNOSIS — Z7901 Long term (current) use of anticoagulants: Secondary | ICD-10-CM | POA: Diagnosis not present

## 2018-09-09 DIAGNOSIS — D751 Secondary polycythemia: Secondary | ICD-10-CM | POA: Insufficient documentation

## 2018-09-09 DIAGNOSIS — I69351 Hemiplegia and hemiparesis following cerebral infarction affecting right dominant side: Secondary | ICD-10-CM | POA: Insufficient documentation

## 2018-09-09 DIAGNOSIS — Z87890 Personal history of sex reassignment: Secondary | ICD-10-CM | POA: Insufficient documentation

## 2018-09-09 DIAGNOSIS — M7522 Bicipital tendinitis, left shoulder: Secondary | ICD-10-CM | POA: Diagnosis not present

## 2018-09-09 DIAGNOSIS — S43002A Unspecified subluxation of left shoulder joint, initial encounter: Secondary | ICD-10-CM | POA: Diagnosis not present

## 2018-09-09 DIAGNOSIS — Z79899 Other long term (current) drug therapy: Secondary | ICD-10-CM

## 2018-09-09 DIAGNOSIS — Z5181 Encounter for therapeutic drug level monitoring: Secondary | ICD-10-CM

## 2018-09-09 DIAGNOSIS — K900 Celiac disease: Secondary | ICD-10-CM | POA: Diagnosis not present

## 2018-09-09 DIAGNOSIS — M545 Low back pain: Secondary | ICD-10-CM | POA: Diagnosis not present

## 2018-09-09 DIAGNOSIS — Z96652 Presence of left artificial knee joint: Secondary | ICD-10-CM | POA: Diagnosis not present

## 2018-09-09 DIAGNOSIS — F418 Other specified anxiety disorders: Secondary | ICD-10-CM | POA: Diagnosis not present

## 2018-09-09 DIAGNOSIS — I69398 Other sequelae of cerebral infarction: Secondary | ICD-10-CM | POA: Diagnosis not present

## 2018-09-09 DIAGNOSIS — G8929 Other chronic pain: Secondary | ICD-10-CM | POA: Diagnosis present

## 2018-09-09 DIAGNOSIS — D6859 Other primary thrombophilia: Secondary | ICD-10-CM | POA: Insufficient documentation

## 2018-09-09 DIAGNOSIS — M5416 Radiculopathy, lumbar region: Secondary | ICD-10-CM | POA: Diagnosis not present

## 2018-09-09 DIAGNOSIS — M7061 Trochanteric bursitis, right hip: Secondary | ICD-10-CM

## 2018-09-09 DIAGNOSIS — Z96651 Presence of right artificial knee joint: Secondary | ICD-10-CM

## 2018-09-09 DIAGNOSIS — Z9181 History of falling: Secondary | ICD-10-CM | POA: Insufficient documentation

## 2018-09-09 DIAGNOSIS — M7062 Trochanteric bursitis, left hip: Secondary | ICD-10-CM

## 2018-09-09 DIAGNOSIS — R209 Unspecified disturbances of skin sensation: Secondary | ICD-10-CM | POA: Insufficient documentation

## 2018-09-09 DIAGNOSIS — I69320 Aphasia following cerebral infarction: Secondary | ICD-10-CM | POA: Diagnosis not present

## 2018-09-09 DIAGNOSIS — L4050 Arthropathic psoriasis, unspecified: Secondary | ICD-10-CM | POA: Diagnosis not present

## 2018-09-09 DIAGNOSIS — G894 Chronic pain syndrome: Secondary | ICD-10-CM

## 2018-09-09 MED ORDER — MORPHINE SULFATE ER 60 MG PO CP24: 60.0000 mg | ORAL_CAPSULE | Freq: Two times a day (BID) | ORAL | 0 refills | Status: DC

## 2018-09-09 MED ORDER — OXYCODONE HCL 10 MG PO TABS: 10.0000 mg | ORAL_TABLET | Freq: Four times a day (QID) | ORAL | 0 refills | Status: DC | PRN

## 2018-09-09 NOTE — Patient Instructions (Signed)
Send a My Chart Message at the end of the month, to  Evaluate medication management.

## 2018-09-09 NOTE — Progress Notes (Signed)
Subjective:    Patient ID: Jesse Bowers, adult    DOB: [birth year], 53 y.o.   MRN: [id number]  HPI: Jesse Bowers is a 53 y.o. choose not to disclose who returns for follow up appointment for chronic pain and medication refill. He states his pain is located in in his lower back, bilateral hips, bilateral knees and also reports generalized joint pain all over. He rates his pain 9. He taking physical therapy three days a week.   [name] S/p Total Right Knee arthroplasty by Dr. Wynelle Bowers on 08/23/2018.   [name] Morphine equivalent is 187.50  MME.He is also prescribed Clorazepate by Dr. Erling Bowers. We have discussed the black box warning of using opioids and benzodiazepines. I highlighted the dangers of using these drugs together and discussed the adverse events including respiratory suppression, overdose, cognitive impairment and importance of compliance with current regimen. We will continue to monitor and adjust as indicated.  He is being closely monitored and under the care of his psychiatrist Dr. Erling Bowers.  Last UDS was Performed on 05/14/2018 it was consistent.   Pain Inventory Average Pain 10 Pain Right Now 9 My pain is constant, sharp and aching  In the last 24 hours, has pain interfered with the following? General activity 10 Relation with others 10 Enjoyment of life 10 What TIME of day is your pain at its worst? daytime,night Sleep (in general) Poor  Pain is worse with: walking, bending, sitting, standing and some activites Pain improves with: heat/ice and therapy/exercise Relief from Meds: 0  Mobility use a walker ability to climb steps?  no do you drive?  no use a wheelchair needs help with transfers  Function disabled: date disabled 2018 I need assistance with the following:  feeding, dressing, bathing, toileting, meal prep, household duties and shopping Do you have any goals in this area?  yes  Neuro/Psych bladder control problems weakness numbness tremor tingling  trouble walking spasms confusion anxiety  Prior Studies x-rays CT/MRI  Physicians involved in your care no   Family History  Problem Relation Age of Onset  . Stroke Maternal Grandfather        66  . Heart attack Maternal Grandfather   . Hypertension Mother   . Psoriasis Mother   . Other Mother        meningioma developed ~2019  . Cancer Paternal Grandfather   . Heart attack Paternal Grandfather   . Stroke Paternal Uncle        age 33  . Polycythemia Paternal Uncle   . Stroke Maternal Grandmother   . Congestive Heart Failure Maternal Grandmother   . Heart attack Maternal Grandmother   . Protein C deficiency Sister 57       Miscarriages   Social History   Socioeconomic History  . Marital status: Married    Spouse name: Not on file  . Number of children: 2  . Years of education: 4y college  . Highest education level: Not on file  Occupational History  . Occupation: Pediatric Nurse practitioner    Comment: Not working since [place] 2015  Social Needs  . Financial resource strain: Not on file  . Food insecurity    Worry: Not on file    Inability: Not on file  . Transportation needs    Medical: Not on file    Non-medical: Not on file  Tobacco Use  . Smoking status: Never Smoker  . Smokeless tobacco: Never Used  Substance and Sexual Activity  . Alcohol use: Yes  Frequency: Never    Comment: social  . Drug use: No  . Sexual activity: Yes    Birth control/protection: None    Comment: patient is a transgender on testosterone shots, no biological kids  Lifestyle  . Physical activity    Days per week: Not on file    Minutes per session: Not on file  . Stress: Not on file  Relationships  . Social Herbalist on phone: Not on file    Gets together: Not on file    Attends religious service: Not on file    Active member of club or organization: Not on file    Attends meetings of clubs or organizations: Not on file    Relationship status: Not on file   Other Topics Concern  . Not on file  Social History Narrative   Education 4 year college, former Therapist, sports X 15 years, pediatric nurse practitioner x 6 years, did NP degree from [employer] [place]. Relocated to [place] about 2 months ago from [place], [place]. Patient was in [place] for last 4 years and prior to that in [place]. His wife is working as Scientist, research (physical sciences) for [employer]. Patient is not working and applying for disability. They have 2 kids but no biologic children.    Past Surgical History:  Procedure Laterality Date  . ABDOMINAL HYSTERECTOMY Bilateral 1994   TAH, BSO- tranverse incision at 53 yo  . ANKLE ARTHROSCOPY WITH RECONSTRUCTION Right 2007  . CHOLECYSTECTOMY     laparoscopic  . COLONOSCOPY     x3  . EYE SURGERY     Left eye 03/02/2018, right 02/15/2018  . HIP ARTHROSCOPY W/ LABRAL REPAIR Right 05/11/2013   acetabular labral tear 03/30/2013  . KNEE ARTHROPLASTY Right   . KNEE JOINT MANIPULATION Left    x3 under anesthesia  . KNEE SURGERY Bilateral 1984   Right ACL, left PCL repair  . LITHOTRIPSY  2005  . LIVER BIOPSY  2013   normal results.  Jesse Bowers MASTECTOMY Bilateral    prior to 2009  . MOUTH SURGERY    . NASAL SEPTUM SURGERY N/A 09/20/2015   by ENT Dr. [name]  . OVARIAN CYST SURGERY Left    size of grapefruit, was informed that she had shortened vagina  . SHOULDER SURGERY Bilateral    Right 08/15/2016, Left 11/15/2016  . THUMB ARTHROSCOPY Left   . THYROIDECTOMY, PARTIAL Left 2008  . TOTAL KNEE ARTHROPLASTY Right 08/23/2018   Procedure: TOTAL KNEE ARTHROPLASTY;  Surgeon: [name], MD;  Location: [facility];  Service: Orthopedics;  Laterality: Right;  64mn  . TOTAL KNEE REVISION Left 02/06/2016   Procedure: LEFT TOTAL KNEE REVISION;  Surgeon: FGaynelle Arabian MD;  Location: [facility];  Service: Orthopedics;  Laterality: Left;  . TOTAL KNEE REVISION Left 04/22/2017   Procedure: Left knee polyethylene revision;  Surgeon: AGaynelle Arabian MD;  Location: [facility];  Service:  Orthopedics;  Laterality: Left;  . UPPER GI ENDOSCOPY  2003   Past Medical History:  Diagnosis Date  . Abnormal weight loss   . Anxiety   . Arthritis   . Celiac disease   . Cervical neck pain with evidence of disc disease    patient has a cyst   . Chronic constipation   . Chronic diastolic heart failure ([place]    Pt. denies  . Chronic pain   . Degenerative disc disease at L5-S1 level    with stenosis  . DVT (deep venous thrombosis) ([place]  Right upper arm, bilateral leg  . Eczema    inguinal, feet  . Elevated liver enzymes   . 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 Family history of adverse reaction to anesthesia    family has problems with anesthesia of nausea and vomiting   . 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 GERD (gastroesophageal reflux disease)    History of in 20's  . Gluten enteropathy   . H/O parotitis    right   . Hard of hearing   . History of kidney stones   . History of retinal tear    Bilateral  . History of staph infection    required wound vac  . Hx-TIA (transient ischemic attack)    2015  . LVH (left ventricular hypertrophy) 12/15/2016   Mild, noted on ECHO  . MVP (mitral valve prolapse)   . NAFL (nonalcoholic fatty liver)   . Neck pain   . Neuromuscular disorder ([place])    bilateral neuropathy feet.  . Pneumonia 12/17/2010  . Polycythemia   . Polycythemia, secondary   . PONV (postoperative nausea and vomiting)   . Protein C deficiency ([place])    Dr. Anne Fu  . Psoriasis    16 X10 cm psoriatic rash on sole of left foot ; open and occ scant bleeding;   . psoriatic arthritis   . PTSD (post-traumatic stress disorder)   . Scaphoid fracture of wrist 09/23/2013  . Seizure ([place])    childhood, medication until age 43 then weanned completely off  . Sleep apnea    split night study last done by Dr. [name] 06/18/15 shows severe OSA, CSA, and hypersomnia, rec bipap  . Splenomegaly   . Stenosis of ureteropelvic junction (UPJ)    left  .  Stroke [facility])    CVA vs TIA in left cerebrum causing slight right sided weakness-Dr. [name] follows  . 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 Tachycardia    hx of   . Transfusion history    past history- none recent, after surgeries due to blood loss  . Wears glasses   . Wears hearing aid    There were no vitals taken for this visit.  Opioid Risk Score:   Fall Risk Score:  `1  Depression screen PHQ 2/9  Depression screen [facility] 2/9 02/02/2018 09/23/2017 08/07/2017 06/24/2017  Decreased Interest 0 0 3 0  Down, Depressed, Hopeless 0 0 3 0  PHQ - 2 Score 0 0 6 0  Altered sleeping - - - 0  Tired, decreased energy - - - 0  Change in appetite - - - 0  Feeling bad or failure about yourself  - - - 0  Trouble concentrating - - - 0  Moving slowly or fidgety/restless - - - 0  Suicidal thoughts - - - 0  PHQ-9 Score - - - 0  Some recent data might be hidden       Review of Systems  All other systems reviewed and are negative.      Objective:   Physical Exam Vitals signs and nursing note reviewed.  Constitutional:      Appearance: Normal appearance.  Neck:     Musculoskeletal: Normal range of motion and neck supple.  Cardiovascular:     Rate and Rhythm: Normal rate and regular rhythm.     Pulses: Normal pulses.     Heart sounds: Normal heart sounds.  Pulmonary:     Effort: Pulmonary effort is  normal.     Breath sounds: Normal breath sounds.  Musculoskeletal:     Comments: Normal Muscle Bulk and Muscle Testing Reveals:  Upper Extremities: Full ROM and Muscle Strength 5/5 Thoracic Paraspinal Tenderness: T-10- T-12  Lumbar Paraspinal Tenderness: L-4-L-5 Bilateral Greater Trochanter Tenderness Lower Extremities: Right: Decreased ROM and Muscle Strength 4/5 Left: Full ROM and Muscle Strength 5/5 Arrived in wheelchair   Skin:    General: Skin is warm and dry.  Neurological:     Mental Status: He is alert and oriented to person, place, and time.  Psychiatric:         Mood and Affect: Mood normal.        Behavior: Behavior normal.           Assessment & Plan:  1. Psoriatic arthritis with pain in multiple areas, most prominently feet, hands, elbows. Refilled:Kadian 60 mg 24 hr. Capsule, one capsule every 12 hours #60, and Oxycodone 10 mg one tablet every 6hours as needed #120.Second script of Morphine sent for the following month. 09/09/2018 We will continue the opioid monitoring program, this consists of regular clinic visits, examinations, urine drug screen, pill counts as well as use of [place] Controlled Substance Reporting System.  Rheumatology Following. 2. Chronic Pain Syndrome: Continue Compound Cream.09/09/2018 3. 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 Wynelle Bowers perform Total Left Knee Revision on 02/06/2016. S/PLeft Knee Revision on 04/22/17.S/PRight TKA on 08/23/2018 with Dr. Wynelle Bowers. 5. Chronic mid- low back pain: Continue current medication regime, and encourage to increase activity as tolerated.09/09/2018 6. Polycythemia: Oncology Following.09/09/2018 7. Depression with anxiety : Psychiatry following.Continue to Monitor.09/09/2018 8. Cervicalgia:No complaints today.Continue HEP as tolerated and Continue to Monitor. Continue current medication regime. 09/09/2018 9. Muscle Spasticity: ContinueBaclofen07/11/2018 10.Right Ankle Pain:Continue to Monitor. 09/09/2018 10. Left lateral epicondylitis:No complaints today.Ortho Following.09/09/2018 11. Bilateral Shoulder Pain:No complaints today.Ortho Following.09/09/2018 12. Lumbar Radiculitis: S/P  Left L5-S1 Translaminar  Injection on 07/09/2018 with, good relief noted.09/09/2018  13.BilateralGreater Trochanteric Bursitis: Continue with Ice and Heat Therapy: Contiue HEP as Tolerated. Continue to Monitor. 09/09/2018.  20 minutes of face to face patient care time was spent during this visit. All questions were encouraged and answered.  F/U in 2 months

## 2018-09-14 ENCOUNTER — Encounter: Payer: Self-pay | Admitting: Hematology

## 2018-09-14 ENCOUNTER — Ambulatory Visit (INDEPENDENT_AMBULATORY_CARE_PROVIDER_SITE_OTHER): Payer: Medicare Other | Admitting: Psychology

## 2018-09-14 DIAGNOSIS — F4312 Post-traumatic stress disorder, chronic: Secondary | ICD-10-CM

## 2018-09-16 ENCOUNTER — Telehealth: Payer: Self-pay | Admitting: Hematology

## 2018-09-16 ENCOUNTER — Ambulatory Visit: Payer: Medicare Other | Admitting: Psychology

## 2018-09-16 NOTE — Telephone Encounter (Signed)
R/s appt per 7/16 sch message- pt aware of appt date and time

## 2018-09-17 ENCOUNTER — Ambulatory Visit (INDEPENDENT_AMBULATORY_CARE_PROVIDER_SITE_OTHER): Payer: Medicare Other | Admitting: Psychology

## 2018-09-17 DIAGNOSIS — F4323 Adjustment disorder with mixed anxiety and depressed mood: Secondary | ICD-10-CM | POA: Diagnosis not present

## 2018-09-21 ENCOUNTER — Ambulatory Visit (INDEPENDENT_AMBULATORY_CARE_PROVIDER_SITE_OTHER): Payer: Medicare Other | Admitting: Psychology

## 2018-09-21 DIAGNOSIS — F4312 Post-traumatic stress disorder, chronic: Secondary | ICD-10-CM

## 2018-09-22 ENCOUNTER — Other Ambulatory Visit: Payer: Medicare Other

## 2018-09-22 ENCOUNTER — Ambulatory Visit: Payer: Medicare Other | Admitting: Hematology

## 2018-09-23 ENCOUNTER — Ambulatory Visit (INDEPENDENT_AMBULATORY_CARE_PROVIDER_SITE_OTHER): Payer: Medicare Other | Admitting: Psychology

## 2018-09-23 ENCOUNTER — Telehealth: Payer: Self-pay | Admitting: Registered Nurse

## 2018-09-23 DIAGNOSIS — F4323 Adjustment disorder with mixed anxiety and depressed mood: Secondary | ICD-10-CM

## 2018-09-23 NOTE — Telephone Encounter (Signed)
Message from patient.

## 2018-09-23 NOTE — Telephone Encounter (Signed)
Placed a call to [name] regarding his My-chart message. PMP was reviewed, we discussed his analgesics. His analgesics will remain the same, encouraged to speak with Dr. Wynelle Link, he has appointment scheduled on 09/28/2018 he reports. Also instructed to alternate Ice and Heat Therapy, he verbalizes understanding.

## 2018-09-28 ENCOUNTER — Ambulatory Visit (INDEPENDENT_AMBULATORY_CARE_PROVIDER_SITE_OTHER): Payer: Medicare Other | Admitting: Psychology

## 2018-09-28 DIAGNOSIS — F4312 Post-traumatic stress disorder, chronic: Secondary | ICD-10-CM

## 2018-10-05 ENCOUNTER — Ambulatory Visit: Payer: BLUE CROSS/BLUE SHIELD | Admitting: Psychology

## 2018-10-12 ENCOUNTER — Ambulatory Visit (INDEPENDENT_AMBULATORY_CARE_PROVIDER_SITE_OTHER): Payer: Medicare Other | Admitting: Psychology

## 2018-10-12 DIAGNOSIS — F4312 Post-traumatic stress disorder, chronic: Secondary | ICD-10-CM | POA: Diagnosis not present

## 2018-10-14 ENCOUNTER — Ambulatory Visit (INDEPENDENT_AMBULATORY_CARE_PROVIDER_SITE_OTHER): Payer: Medicare Other | Admitting: Psychology

## 2018-10-14 DIAGNOSIS — F4312 Post-traumatic stress disorder, chronic: Secondary | ICD-10-CM | POA: Diagnosis not present

## 2018-10-19 ENCOUNTER — Ambulatory Visit (INDEPENDENT_AMBULATORY_CARE_PROVIDER_SITE_OTHER): Payer: Medicare Other | Admitting: Psychology

## 2018-10-19 DIAGNOSIS — F4312 Post-traumatic stress disorder, chronic: Secondary | ICD-10-CM | POA: Diagnosis not present

## 2018-10-21 ENCOUNTER — Ambulatory Visit (INDEPENDENT_AMBULATORY_CARE_PROVIDER_SITE_OTHER): Payer: Medicare Other | Admitting: Psychology

## 2018-10-21 DIAGNOSIS — F4312 Post-traumatic stress disorder, chronic: Secondary | ICD-10-CM | POA: Diagnosis not present

## 2018-10-24 ENCOUNTER — Other Ambulatory Visit: Payer: Self-pay

## 2018-10-24 ENCOUNTER — Encounter (HOSPITAL_COMMUNITY): Payer: Self-pay | Admitting: Emergency Medicine

## 2018-10-24 ENCOUNTER — Ambulatory Visit (HOSPITAL_COMMUNITY)
Admission: EM | Admit: 2018-10-24 | Discharge: 2018-10-24 | Disposition: A | Discharge status: Home / Self Care | Payer: Medicare Other | Attending: Emergency Medicine | Admitting: Emergency Medicine

## 2018-10-24 DIAGNOSIS — H60391 Other infective otitis externa, right ear: Secondary | ICD-10-CM | POA: Diagnosis not present

## 2018-10-24 MED ORDER — CLINDAMYCIN HCL 300 MG PO CAPS: 300.0000 mg | ORAL_CAPSULE | Freq: Three times a day (TID) | ORAL | 0 refills | Status: AC

## 2018-10-24 NOTE — ED Triage Notes (Signed)
Assessment per provider

## 2018-10-24 NOTE — Discharge Instructions (Signed)
Clindamycin 3 times daily/every 8 hours for the next week Keep ear clean and dry Follow up with ENT as planned Follow up here or ED if not resolving/worsening

## 2018-10-25 NOTE — ED Provider Notes (Signed)
Jesse Bowers    CSN: [id number] Arrival date & time: 10/24/18  1431      History   Chief Complaint No chief complaint on file. Right ear pain  HPI Jesse Bowers is a 53 y.o. adult significant medical history detailed below presenting today for evaluation of right ear pain.  Patient states that over the past week he has developed ear pain and swelling in his right ear.  At times he is also noticed swelling below his ear and has had some discomfort within his jaw.  He notes that he has psoriatic arthritis and will occasionally otitis externa secondary to drops he uses within his ears.  He has Ciprodex as well as Augmentin/dexamethasone drops that he will use when the symptoms flareup.  His symptoms have not improved with using these medicines.  He continues to have pain swelling and discomfort in his ear and around the ear.  He denies sore throat, but has felt the discomfort with swallowing on the right side.  Denies fevers.  Denies cough or congestion.  Has some slight discomfort with moving neck, but denies neck stiffness.  HPI  Past Medical History:  Diagnosis Date  . Abnormal weight loss   . Anxiety   . Arthritis   . Celiac disease   . Cervical neck pain with evidence of disc disease    patient has a cyst   . Chronic constipation   . Chronic diastolic heart failure ([place])    Pt. denies  . Chronic pain   . Degenerative disc disease at L5-S1 level    with stenosis  . DVT (deep venous thrombosis) (HCC)    Right upper arm, bilateral leg  . Eczema    inguinal, feet  . Elevated liver enzymes   . 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 Family history of adverse reaction to anesthesia    family has problems with anesthesia of nausea and vomiting   . 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 GERD (gastroesophageal reflux disease)    History of in 20's  . Gluten enteropathy   . H/O parotitis    right   . Hard of hearing   . History of kidney stones   . History  of retinal tear    Bilateral  . History of staph infection    required wound vac  . Hx-TIA (transient ischemic attack)    2015  . LVH (left ventricular hypertrophy) 12/15/2016   Mild, noted on ECHO  . MVP (mitral valve prolapse)   . NAFL (nonalcoholic fatty liver)   . Neck pain   . Neuromuscular disorder ([place])    bilateral neuropathy feet.  . Pneumonia 12/17/2010  . Polycythemia   . Polycythemia, secondary   . PONV (postoperative nausea and vomiting)   . Protein C deficiency ([place])    Dr. Anne Fu  . Psoriasis    16 X10 cm psoriatic rash on sole of left foot ; open and occ scant bleeding;   . psoriatic arthritis   . PTSD (post-traumatic stress disorder)   . Scaphoid fracture of wrist 09/23/2013  . Seizure ([place])    childhood, medication until age 47 then weanned completely off  . Sleep apnea    split night study last done by Dr. [name] 06/18/15 shows severe OSA, CSA, and hypersomnia, rec bipap  . Splenomegaly   . Stenosis of ureteropelvic junction (UPJ)    left  . Stroke [facility])    CVA vs TIA in left cerebrum causing slight  right sided weakness-Dr. [name] follows  . 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 Tachycardia    hx of   . Transfusion history    past history- none recent, after surgeries due to blood loss  . Wears glasses   . Wears hearing aid     Patient Active Problem List   Diagnosis Date Noted  . Osteoarthritis of right knee 08/23/2018  . Constipation due to opioid therapy 03/30/2018  . Retinopathy of both eyes 01/06/2018  . SNHL (sensorineural hearing loss) 12/04/2017  . Abnormal urinary stream 12/03/2017  . Osteoarthritis of carpometacarpal (CMC) joint of thumb 11/30/2017  . Muscle weakness 11/17/2017  . Transient vision disturbance 11/12/2017  . Bilateral hand pain 10/30/2017  . Pain of left hip joint 10/09/2017  . Gynecomastia 07/10/2017  . Iron deficiency anemia 07/05/2017  . Spasticity 05/20/2017  . Lumbar radiculitis  04/20/2017  . Ulnar 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, left 11/28/2016  . Idiopathic peripheral neuropathy 11/28/2016  . 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 Long term (current) use of anticoagulants 08/23/2015  . Right upper quadrant abdominal pain 08/23/2015  . Memory loss 05/10/2015  . Gait abnormality 04/07/2015  . Alkaline phosphatase elevation 04/07/2015  . History of thrombosis 03/26/2015  . Medial epicondylitis 02/07/2015  . Cognitive decline 12/21/2014  . Leukopenia 12/05/2014  . Rotator cuff syndrome of right shoulder 10/27/2014  . Status post left knee replacement 08/22/2014  . Left lateral epicondylitis 08/22/2014  . Chronic cerebral ischemia 08/18/2014  . Arthrofibrosis of knee joint 08/17/2014  . Cubital canal compression syndrome, left 08/17/2014  . 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 Chronic pain syndrome 04/10/2014  . Insomnia 04/10/2014  . Chronic non-specific white matter lesions on MRI 04/10/2014  . CFS (chronic fatigue syndrome) 04/10/2014  . 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 Biceps tendonitis on left 03/01/2014  . Polycythemia vera ([place]) 12/27/2013  . H/O TIA (transient ischemic attack) and stroke 12/27/2013  . Neck pain 12/27/2013  . OSA (obstructive sleep apnea) 12/08/2013  . 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 Depression with anxiety 08/04/2013  . 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 Post traumatic stress disorder (PTSD) 08/01/2013  . Speech abnormality 07/25/2013  . Obesity 05/10/2013  . Lower extremity edema 05/10/2013  . GERD (gastroesophageal reflux disease) 05/10/2013  . Arthritis 05/10/2013  . OA (osteoarthritis) of knee 03/15/2013  . Headache 10/25/2012  . Palpitations 10/18/2012  . Fatty liver determined by biopsy 06/01/2012  . Arthropathic psoriasis, unspecified ([place]) 04/29/2012  . Abnormal liver enzymes 03/29/2012  . Psoriatic arthritis ([place]) 12/29/2011  . Left Renal Hydronephrosis 12/11/2010  . 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 Celiac disease 05/27/2010  . Thyroid nodule 05/27/2010  . Male-to-male transgender person 09/20/2002    Past Surgical History:  Procedure Laterality Date  . ABDOMINAL HYSTERECTOMY Bilateral 1994   TAH, BSO- tranverse incision at 53 yo  . ANKLE ARTHROSCOPY WITH RECONSTRUCTION Right 2007  . CHOLECYSTECTOMY     laparoscopic  . COLONOSCOPY     x3  . EYE SURGERY     Left eye 03/02/2018, right 02/15/2018  . HIP ARTHROSCOPY W/ LABRAL REPAIR Right 05/11/2013   acetabular labral tear 03/30/2013  . KNEE ARTHROPLASTY Right   . KNEE JOINT MANIPULATION Left    x3 under anesthesia  . KNEE SURGERY Bilateral 1984   Right ACL, left PCL repair  . LITHOTRIPSY  2005  . LIVER BIOPSY  2013   normal results.  [name]  MASTECTOMY Bilateral    prior to 2009  . MOUTH SURGERY    . NASAL SEPTUM SURGERY N/A 09/20/2015   by ENT Dr. [name]  . OVARIAN CYST SURGERY Left    size of grapefruit, was informed that she had shortened vagina  . SHOULDER SURGERY Bilateral    Right 08/15/2016, Left 11/15/2016  . THUMB ARTHROSCOPY Left   . THYROIDECTOMY, PARTIAL Left 2008  . TOTAL KNEE ARTHROPLASTY Right 08/23/2018   Procedure: TOTAL KNEE ARTHROPLASTY;  Surgeon: [name], MD;  Location: [facility];  Service: Orthopedics;  Laterality: Right;  101mn  . TOTAL KNEE REVISION Left 02/06/2016   Procedure: LEFT TOTAL KNEE REVISION;  Surgeon: FGaynelle Arabian MD;  Location: [facility];  Service: Orthopedics;  Laterality: Left;  . TOTAL KNEE REVISION Left 04/22/2017   Procedure: Left knee polyethylene revision;  Surgeon: AGaynelle Arabian MD;  Location: [facility];  Service: Orthopedics;  Laterality: Left;  . UPPER GI ENDOSCOPY  2003    OB History   No obstetric history on file.      Home Medications    Prior to Admission medications   Medication Sig Start Date End Date Taking? Authorizing Provider  acyclovir ointment (ZOVIRAX) 5 % Apply 1 application topically daily as needed (cold  sores).  01/08/18   [provider]  antiseptic oral rinse (BIOTENE) LIQD 15 mLs by Mouth Rinse route as needed for dry mouth.    [provider]  augmented betamethasone dipropionate (DIPROLENE-AF) 0.05 % ointment Apply 1 application topically 2 (two) times daily.    [provider]  baclofen (LIORESAL) 20 MG tablet Take 1 tablet (20 mg total) by mouth 4 (four) times daily. 02/17/18   SMeredith Staggers MD  Calcipotriene-Betameth Diprop (ENSTILAR) 0.005-0.064 % FOAM Apply 1 application topically 2 (two) times daily.     [provider]  clindamycin (CLEOCIN) 300 MG capsule Take 1 capsule (300 mg total) by mouth 3 (three) times daily for 7 days. 10/24/18 10/31/18  Shauntelle Jamerson C, PA-C  clobetasol (TEMOVATE) 0.05 % GEL Apply 1 application topically 2 (two) times daily as needed (itching).    [provider]  clorazepate (TRANXENE) 15 MG tablet Take 15 mg by mouth at bedtime.    [provider]  clorazepate (TRANXENE) 7.5 MG tablet Take 7.5 mg by mouth 2 (two) times daily.     [provider]  desonide (DESOWEN) 0.05 % ointment Apply 1 application topically 2 (two) times daily as needed (psoriasis).  08/04/15   [provider]  diclofenac sodium (VOLTAREN) 1 % GEL APPLY TOPICALLY TO BOTH HANDS 3 TIMES DAILY. 09/02/18   TBayard Hugger NP  Emollient (CERAVE) CREA Apply 1 application topically 2 (two) times a day.    [provider]  Fluocinolone Acetonide 0.01 % OIL Place 3 drops into both ears 2 (two) times daily as needed for itching. 06/09/18   [provider]  furosemide (LASIX) 20 MG tablet Take 20 mg by mouth daily as needed for fluid or edema. AS NEEDED FOR WEIGHT GAIN OF 3LBS IN 1DAY OR 5LBS/WEEK     [provider]  furosemide (LASIX) 40 MG tablet Take 40 mg by mouth 2 (two) times daily as needed for fluid or edema.     [provider]  methocarbamol (ROBAXIN) 500 MG tablet Take 1 tablet (500  mg total) by mouth every 6 (six) hours as needed for muscle spasms. 08/25/18   Edmisten, KOk Anis PA  mirabegron ER (MYRBETRIQ) 25  MG TB24 tablet Take 1 tablet (25 mg total) by mouth daily. 04/13/18   Hollice Espy, MD  morphine (KADIAN) 60 MG 24 hr capsule Take 1 capsule (60 mg total) by mouth every 12 (twelve) hours. 09/09/18   Bayard Hugger, NP  NEEDLE, DISP, 18 G 18G X 1-1/2" MISC 1 Units by Does not apply route once a week. 12/03/17   Shawnee Knapp, MD  NEEDLE, DISP, 23 G 23G X 1" MISC Hypodermic Needles 23 gauge x 1"  USE ONCE A WEEK    [provider]  NEEDLE, DISP, 23 G 23G X 3/4" MISC 1 Units by Does not apply route once a week. 12/03/17   Shawnee Knapp, MD  neomycin-polymyxin-hydrocortisone (CORTISPORIN) 3.5-10000-1 OTIC suspension Place 4 drops into both ears 2 (two) times daily as needed (ear pain).  03/31/17   [provider]  Oxycodone HCl 10 MG TABS Take 1 tablet (10 mg total) by mouth every 6 (six) hours as needed. 09/09/18   Bayard Hugger, NP  potassium chloride (K-DUR) 10 MEQ tablet TAKE K-DUR 2 TABLETS 10 MEQ DAILY Patient taking differently: Take 20 mEq by mouth daily.  05/11/18   Croitoru, Mihai, MD  prazosin (MINIPRESS) 2 MG capsule Take 4 mg by mouth at bedtime.  02/04/17   [provider]  Propylene Glycol (SYSTANE COMPLETE) 0.6 % SOLN Place 1-2 drops into both eyes 4 (four) times daily as needed (dry eyes).     [provider]  rivaroxaban (XARELTO) 20 MG TABS tablet TAKE 1 TABLET DAILY BEFORE BEDTIME Patient taking differently: Take 20 mg by mouth at bedtime. TAKE 1 TABLET DAILY BEFORE BEDTIME 03/11/18   [name], MD  Secukinumab (COSENTYX SENSOREADY PEN) 150 MG/ML SOAJ Inject 300 mg into the skin every 28 (twenty-eight) days.     [provider]  Syringe, Disposable, 1 ML MISC 1 Units by Does not apply route once a week. 12/03/17   Shawnee Knapp, MD  SYRINGE-NEEDLE, DISP, 3 ML (B-D 3CC LUER-LOK SYR 18GX1-1/2) 18G X 1-1/2" 3 ML MISC BD  [employer] Syringe 3 mL 18 x 1 1/2"  USE ONCE A WEEK    [provider]  testosterone cypionate (DEPOTESTOSTERONE CYPIONATE) 200 MG/ML injection INJECT 0.5 MLS (100 MG TOTAL) INTO THE MUSCLE ONCE A WEEK. 03/15/18   Shawnee Knapp, MD  ursodiol (ACTIGALL) 250 MG tablet Take 250 mg by mouth 2 (two) times daily with a meal.     [provider]  ursodiol (ACTIGALL) 500 MG tablet Take 500 mg by mouth 2 (two) times daily with a meal.     [provider]  venlafaxine XR (EFFEXOR-XR) 150 MG 24 hr capsule Take 2 capsules (300 mg total) by mouth daily with breakfast. 10/14/17   Meredith Staggers, MD    Family History Family History  Problem Relation Age of Onset  . Stroke Maternal Grandfather        54  . Heart attack Maternal Grandfather   . Hypertension Mother   . Psoriasis Mother   . Other Mother        meningioma developed ~2019  . Cancer Paternal Grandfather   . Heart attack Paternal Grandfather   . Stroke Paternal Uncle        age 87  . Polycythemia Paternal Uncle   . Stroke Maternal Grandmother   . Congestive Heart Failure Maternal Grandmother   . Heart attack Maternal Grandmother   . Protein C deficiency Sister 24  Miscarriages    Social History Social History   Tobacco Use  . Smoking status: Never Smoker  . Smokeless tobacco: Never Used  Substance Use Topics  . Alcohol use: Yes    Frequency: Never    Comment: social  . Drug use: No     Allergies   Gabapentin, Penicillin g, Sulfa antibiotics, Tegaderm chg dressing  [chlorhexidine], Vancomycin, Duloxetine, Tegaderm ag mesh [silver], Citalopram, Wheat bran, Ibuprofen, Nortriptyline, Pregabalin, and Sulfacetamide sodium-sulfur   Review of Systems Review of Systems  Constitutional: Negative for activity change, appetite change, chills, fatigue and fever.  HENT: Positive for ear pain, facial swelling and sore throat. Negative for congestion, rhinorrhea, sinus pressure and trouble swallowing.    Eyes: Negative for discharge and redness.  Respiratory: Negative for cough, chest tightness and shortness of breath.   Cardiovascular: Negative for chest pain.  Gastrointestinal: Negative for abdominal pain, diarrhea, nausea and vomiting.  Musculoskeletal: Negative for myalgias.  Skin: Negative for rash.  Neurological: Negative for dizziness, light-headedness and headaches.     Physical Exam Triage Vital Signs ED Triage Vitals  Enc Vitals Group     BP 10/24/18 1621 120/84     Pulse Rate 10/24/18 1621 97     Resp 10/24/18 1621 16     Temp 10/24/18 1621 97.9 F (36.6 C)     Temp Source 10/24/18 1621 Temporal     SpO2 10/24/18 1621 98 %     Weight --      Height --      Head Circumference --      Peak Flow --      Pain Score 10/24/18 1659 7     Pain Loc --      Pain Edu? --      Excl. in [place]? --    No data found.  Updated Vital Signs BP 120/84 (BP Location: Left Arm)   Pulse 97   Temp 97.9 F (36.6 C) (Temporal)   Resp 16   SpO2 98%   Visual Acuity Right Eye Distance:   Left Eye Distance:   Bilateral Distance:    Right Eye Near:   Left Eye Near:    Bilateral Near:     Physical Exam Vitals signs and nursing note reviewed.  Constitutional:      General: He is not in acute distress.    Appearance: He is well-developed.  HENT:     Head: Normocephalic and atraumatic.     Ears:     Comments: Right ear: External auricle slightly erythematous and tender to touch, EAC significantly erythematous edematous and does appear to have debris/fluid present.  TM minimally visualized, but appears dull and irregular.  Left canal without erythema or swelling, TM intact and pearly gray with good cone of light    Mouth/Throat:     Comments: Oral mucosa pink and moist, no tonsillar enlargement or exudate. Posterior pharynx patent and nonerythematous, no uvula deviation or swelling. Normal phonation. No soft palate swelling Eyes:     Conjunctiva/sclera: Conjunctivae normal.   Neck:     Musculoskeletal: Neck supple.     Comments: No palpable lymphadenopathy, tenderness to palpation over right tonsillar area No overlying erythema or swelling Full active range of motion of neck Cardiovascular:     Rate and Rhythm: Normal rate and regular rhythm.     Heart sounds: No murmur.  Pulmonary:     Effort: Pulmonary effort is normal. No respiratory distress.     Breath sounds: Normal breath sounds.  Abdominal:     Palpations: Abdomen is soft.     Tenderness: There is no abdominal tenderness.  Skin:    General: Skin is warm and dry.  Neurological:     Mental Status: He is alert.      UC Treatments / Results  Labs (all labs ordered are listed, but only abnormal results are displayed) Labs Reviewed - No data to display  EKG   Radiology No results found.  Procedures Procedures (including critical care time)  Medications Ordered in UC Medications - No data to display  Initial Impression / Assessment and Plan / UC Course  I have reviewed the triage vital signs and the nursing notes.  Pertinent labs & imaging results that were available during my care of the patient were reviewed by me and considered in my medical decision making (see chart for details).     Patient feeling topical therapy for otitis externa.  Vital signs stable.  Has allergies to penicillins.  Believes he has take cephalosporins without issue, but is typically placed on clindamycin.  Will place on [place], follow-up with ENT as planned.  Does not appear to have deep space infection at this time.  Pain likely radiating.  We will continue to monitor swelling/pain. Discussed strict return precautions. Patient verbalized understanding and is agreeable with plan.  Final Clinical Impressions(s) / UC Diagnoses   Final diagnoses:  Infective otitis externa of right ear     Discharge Instructions     Clindamycin 3 times daily/every 8 hours for the next week Keep ear clean and dry Follow up  with ENT as planned Follow up here or ED if not resolving/worsening   ED Prescriptions    Medication Sig Dispense Auth. Provider   clindamycin (CLEOCIN) 300 MG capsule Take 1 capsule (300 mg total) by mouth 3 (three) times daily for 7 days. 21 capsule Natascha Edmonds C, PA-C     Controlled Substance Prescriptions [place] Controlled Substance Registry consulted? Not Applicable   Janith Lima, [place] 10/25/18 1028

## 2018-10-26 ENCOUNTER — Ambulatory Visit (INDEPENDENT_AMBULATORY_CARE_PROVIDER_SITE_OTHER): Payer: Medicare Other | Admitting: Psychology

## 2018-10-26 DIAGNOSIS — F4312 Post-traumatic stress disorder, chronic: Secondary | ICD-10-CM

## 2018-10-28 ENCOUNTER — Ambulatory Visit: Payer: Medicare Other | Admitting: Psychology

## 2018-10-29 ENCOUNTER — Telehealth: Payer: Self-pay

## 2018-10-29 NOTE — Telephone Encounter (Signed)
Patient called stating he is reminding you to send Morphine to [id number]

## 2018-10-29 NOTE — Telephone Encounter (Signed)
Return Mr. Knapik called, Morphine was already sent to pharmacy. Placed a call to Mr. Fregia regarding the above he verbalizes understanding.

## 2018-11-02 ENCOUNTER — Ambulatory Visit (INDEPENDENT_AMBULATORY_CARE_PROVIDER_SITE_OTHER): Payer: Medicare Other | Admitting: Psychology

## 2018-11-02 DIAGNOSIS — F4312 Post-traumatic stress disorder, chronic: Secondary | ICD-10-CM | POA: Diagnosis not present

## 2018-11-03 ENCOUNTER — Other Ambulatory Visit: Payer: Self-pay

## 2018-11-03 ENCOUNTER — Ambulatory Visit (INDEPENDENT_AMBULATORY_CARE_PROVIDER_SITE_OTHER): Payer: Medicare Other | Admitting: Cardiovascular Disease

## 2018-11-03 ENCOUNTER — Encounter: Payer: Self-pay | Admitting: Cardiovascular Disease

## 2018-11-03 VITALS — BP 112/87 | HR 104 | Temp 97.2°F | Ht 69.0 in | Wt 208.6 lb

## 2018-11-03 DIAGNOSIS — G4733 Obstructive sleep apnea (adult) (pediatric): Secondary | ICD-10-CM

## 2018-11-03 DIAGNOSIS — Z7901 Long term (current) use of anticoagulants: Secondary | ICD-10-CM

## 2018-11-03 DIAGNOSIS — I739 Peripheral vascular disease, unspecified: Secondary | ICD-10-CM | POA: Diagnosis not present

## 2018-11-03 DIAGNOSIS — I5032 Chronic diastolic (congestive) heart failure: Secondary | ICD-10-CM

## 2018-11-03 DIAGNOSIS — R002 Palpitations: Secondary | ICD-10-CM

## 2018-11-03 DIAGNOSIS — D6859 Other primary thrombophilia: Secondary | ICD-10-CM

## 2018-11-03 MED ORDER — FUROSEMIDE 40 MG PO TABS: 60.0000 mg | ORAL_TABLET | Freq: Every day | ORAL | 5 refills | Status: DC

## 2018-11-03 NOTE — Progress Notes (Signed)
Cardiology Office Note:    Date:  11/04/2018   ID:  Jesse Bowers, DOB 1965-05-12, MRN [id number]  PCP:  Jesse Elk, MD  Cardiologist:  Jesse Klein, MD   Referring MD: Jesse Elk, MD   Chief Complaint  Patient presents with  . Shortness of Breath    History of Present Illness:    Jesse Bowers is a 53 y.o. adult with a hx of chronic diastolic heart failure, obstructive sleep apnea on CPAP, history of previous stroke on chronic anticoagulation with Xarelto (protein C deficiency) returning for routine follow-up. 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  After undergoing another surgery on his right knee he has some trouble with his appetite and has lost about 20 pounds since December.  He now weighs much less than her previous estimation of his "dry weight".  He has dyspnea when he bends over to put on his shoes.  He habitually sleeps on 3 pillows.  She does have some shortness of breath with walking.  He does not have any chest pain or tightness with walking.  He does describe some tightness in both calves of both legs when he walks, but is limited more by his orthopedic problems.  He reports edema in his ankles, there is very little visible edema today.  He does have some varicose veins bilaterally.  He has not had syncope, but at times has fairly severe orthostatic dizziness (this was particularly bad when he was taking 6 mg of prazosin daily for PTSD/night terrors, better now that he has weaned down to 2 mg daily).  He denies any new focal neurological complaints and has minimal residual right-sided weakness from his previous stroke.  He has not had any falls, serious injuries or major bleeding problems  On Cosentyx for psoriasis.    Past Medical History:  Diagnosis Date  . Abnormal weight loss   . Anxiety   . Arthritis   . Celiac disease   . Cervical neck pain with evidence of disc disease    patient has a cyst   .  Chronic constipation   . Chronic diastolic heart failure ([place])    Pt. denies  . Chronic pain   . Degenerative disc disease at L5-S1 level    with stenosis  . DVT (deep venous thrombosis) (HCC)    Right upper arm, bilateral leg  . Eczema    inguinal, feet  . Elevated liver enzymes   . 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 Family history of adverse reaction to anesthesia    family has problems with anesthesia of nausea and vomiting   . 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 GERD (gastroesophageal reflux disease)    History of in 20's  . Gluten enteropathy   . H/O parotitis    right   . Hard of hearing   . History of kidney stones   . History of retinal tear    Bilateral  . History of staph infection    required wound vac  . Hx-TIA (transient ischemic attack)    2015  . LVH (left ventricular hypertrophy) 12/15/2016   Mild, noted on ECHO  . MVP (mitral valve prolapse)   . NAFL (nonalcoholic fatty liver)   . Neck pain   . Neuromuscular disorder ([place])    bilateral neuropathy feet.  . Pneumonia 12/17/2010  . Polycythemia   . Polycythemia, secondary   . PONV (postoperative nausea and vomiting)   .  Protein C deficiency ([place])    Dr. Anne Bowers  . Psoriasis    16 X10 cm psoriatic rash on sole of left foot ; open and occ scant bleeding;   . psoriatic arthritis   . PTSD (post-traumatic stress disorder)   . Scaphoid fracture of wrist 09/23/2013  . Seizure ([place])    childhood, medication until age 38 then weanned completely off  . Sleep apnea    split night study last done by Dr. [name] 06/18/15 shows severe OSA, CSA, and hypersomnia, rec bipap  . Splenomegaly   . Stenosis of ureteropelvic junction (UPJ)    left  . Stroke [facility])    CVA vs TIA in left cerebrum causing slight right sided weakness-Dr. [name] follows  . 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 Tachycardia    hx of   . Transfusion history    past history- none recent, after surgeries  due to blood loss  . Wears glasses   . Wears hearing aid     Past Surgical History:  Procedure Laterality Date  . ABDOMINAL HYSTERECTOMY Bilateral 1994   TAH, BSO- tranverse incision at 53 yo  . ANKLE ARTHROSCOPY WITH RECONSTRUCTION Right 2007  . CHOLECYSTECTOMY     laparoscopic  . COLONOSCOPY     x3  . EYE SURGERY     Left eye 03/02/2018, right 02/15/2018  . HIP ARTHROSCOPY W/ LABRAL REPAIR Right 05/11/2013   acetabular labral tear 03/30/2013  . KNEE ARTHROPLASTY Right   . KNEE JOINT MANIPULATION Left    x3 under anesthesia  . KNEE SURGERY Bilateral 1984   Right ACL, left PCL repair  . LITHOTRIPSY  2005  . LIVER BIOPSY  2013   normal results.  [name] MASTECTOMY Bilateral    prior to 2009  . MOUTH SURGERY    . NASAL SEPTUM SURGERY N/A 09/20/2015   by ENT Dr. [name]  . OVARIAN CYST SURGERY Left    size of grapefruit, was informed that she had shortened vagina  . SHOULDER SURGERY Bilateral    Right 08/15/2016, Left 11/15/2016  . THUMB ARTHROSCOPY Left   . THYROIDECTOMY, PARTIAL Left 2008  . TOTAL KNEE ARTHROPLASTY Right 08/23/2018   Procedure: TOTAL KNEE ARTHROPLASTY;  Surgeon: [name], MD;  Location: [facility];  Service: Orthopedics;  Laterality: Right;  52mn  . TOTAL KNEE REVISION Left 02/06/2016   Procedure: LEFT TOTAL KNEE REVISION;  Surgeon: FGaynelle Arabian MD;  Location: [facility];  Service: Orthopedics;  Laterality: Left;  . TOTAL KNEE REVISION Left 04/22/2017   Procedure: Left knee polyethylene revision;  Surgeon: AGaynelle Arabian MD;  Location: [facility];  Service: Orthopedics;  Laterality: Left;  . UPPER GI ENDOSCOPY  2003    Current Medications: Current Meds  Medication Sig  . acyclovir ointment (ZOVIRAX) 5 % Apply 1 application topically daily as needed (cold sores).   .Marland Kitchenantiseptic oral rinse (BIOTENE) LIQD 15 mLs by Mouth Rinse route as needed for dry mouth.  .Marland Kitchenaugmented betamethasone dipropionate (DIPROLENE-AF) 0.05 % ointment Apply 1 application topically 2 (two)  times daily.  . baclofen (LIORESAL) 20 MG tablet Take 1 tablet (20 mg total) by mouth 4 (four) times daily.  . Calcipotriene-Betameth Diprop (ENSTILAR) 0.005-0.064 % FOAM Apply 1 application topically 2 (two) times daily.   . clobetasol (TEMOVATE) 0.05 % GEL Apply 1 application topically 2 (two) times daily as needed (itching).  . clorazepate (TRANXENE) 15 MG tablet Take 15 mg by mouth at bedtime.  .[name]  clorazepate (TRANXENE) 7.5 MG tablet Take 7.5 mg by mouth 2 (two) times daily.   [name] desonide (DESOWEN) 0.05 % ointment Apply 1 application topically 2 (two) times daily as needed (psoriasis).   [name] diclofenac sodium (VOLTAREN) 1 % GEL APPLY TOPICALLY TO BOTH HANDS 3 TIMES DAILY.  [name] Emollient (CERAVE) CREA Apply 1 application topically 2 (two) times a day.  . Fluocinolone Acetonide 0.01 % OIL Place 3 drops into both ears 2 (two) times daily as needed for itching.  . furosemide (LASIX) 40 MG tablet Take 1.5 tablets (60 mg total) by mouth daily.  . mirabegron ER (MYRBETRIQ) 25 MG TB24 tablet Take 1 tablet (25 mg total) by mouth daily.  [name] morphine (KADIAN) 60 MG 24 hr capsule Take 1 capsule (60 mg total) by mouth every 12 (twelve) hours.  [name] NEEDLE, DISP, 18 G 18G X 1-1/2" MISC 1 Units by Does not apply route once a week.  [name] NEEDLE, DISP, 23 G 23G X 1" MISC Hypodermic Needles 23 gauge x 1"  USE ONCE A WEEK  . NEEDLE, DISP, 23 G 23G X 3/4" MISC 1 Units by Does not apply route once a week.  . neomycin-polymyxin-hydrocortisone (CORTISPORIN) 3.5-10000-1 OTIC suspension Place 4 drops into both ears 2 (two) times daily as needed (ear pain).   . Oxycodone HCl 10 MG TABS Take 1 tablet (10 mg total) by mouth every 6 (six) hours as needed.  . potassium chloride (K-DUR) 10 MEQ tablet TAKE K-DUR 2 TABLETS 10 MEQ DAILY (Patient taking differently: Take 20 mEq by mouth daily. )  . prazosin (MINIPRESS) 2 MG capsule Take 4 mg by mouth at bedtime.   [name] Propylene Glycol (SYSTANE COMPLETE) 0.6 % SOLN Place 1-2 drops into both  eyes 4 (four) times daily as needed (dry eyes).   . rivaroxaban (XARELTO) 20 MG TABS tablet TAKE 1 TABLET DAILY BEFORE BEDTIME (Patient taking differently: Take 20 mg by mouth at bedtime. TAKE 1 TABLET DAILY BEFORE BEDTIME)  . Secukinumab (COSENTYX SENSOREADY PEN) 150 MG/ML SOAJ Inject 300 mg into the skin every 28 (twenty-eight) days.   . Syringe, Disposable, 1 ML MISC 1 Units by Does not apply route once a week.  . SYRINGE-NEEDLE, DISP, 3 ML (B-D 3CC LUER-LOK SYR 18GX1-1/2) 18G X 1-1/2" 3 ML MISC BD Luer-Lok Syringe 3 mL 18 x 1 1/2"  USE ONCE A WEEK  . testosterone cypionate (DEPOTESTOSTERONE CYPIONATE) 200 MG/ML injection INJECT 0.5 MLS (100 MG TOTAL) INTO THE MUSCLE ONCE A WEEK.  . ursodiol (ACTIGALL) 250 MG tablet Take 250 mg by mouth 2 (two) times daily with a meal.   . ursodiol (ACTIGALL) 500 MG tablet Take 500 mg by mouth 2 (two) times daily with a meal.   . venlafaxine XR (EFFEXOR-XR) 150 MG 24 hr capsule Take 2 capsules (300 mg total) by mouth daily with breakfast.  . [DISCONTINUED] furosemide (LASIX) 20 MG tablet Take 20 mg by mouth daily as needed for fluid or edema. AS NEEDED FOR WEIGHT GAIN OF 3LBS IN 1DAY OR 5LBS/WEEK   . [DISCONTINUED] furosemide (LASIX) 40 MG tablet Take 40 mg by mouth 2 (two) times daily as needed for fluid or edema.      Allergies:   Gabapentin, Penicillin g, Sulfa antibiotics, Tegaderm chg dressing  [chlorhexidine], Vancomycin, Duloxetine, Tegaderm ag mesh [silver], Citalopram, Wheat bran, Ibuprofen, Nortriptyline, Pregabalin, and Sulfacetamide sodium-sulfur   Social History   Socioeconomic History  . Marital status: Married    Spouse name: Not on file  .  Number of children: 2  . Years of education: 4y college  . Highest education level: Not on file  Occupational History  . Occupation: Pediatric Nurse practitioner    Comment: Not working since [place] 2015  Social Needs  . Financial resource strain: Not on file  . Food insecurity    Worry: Not on file     Inability: Not on file  . Transportation needs    Medical: Not on file    Non-medical: Not on file  Tobacco Use  . Smoking status: Never Smoker  . Smokeless tobacco: Never Used  Substance and Sexual Activity  . Alcohol use: Yes    Frequency: Never    Comment: social  . Drug use: No  . Sexual activity: Yes    Birth control/protection: None    Comment: patient is a transgender on testosterone shots, no biological kids  Lifestyle  . Physical activity    Days per week: Not on file    Minutes per session: Not on file  . Stress: Not on file  Relationships  . Social Herbalist on phone: Not on file    Gets together: Not on file    Attends religious service: Not on file    Active member of club or organization: Not on file    Attends meetings of clubs or organizations: Not on file    Relationship status: Not on file  Other Topics Concern  . Not on file  Social History Narrative   Education 4 year college, former Therapist, sports X 15 years, pediatric nurse practitioner x 6 years, did NP degree from [place] of [place]. Relocated to [place] about 2 months ago from [place], [place]. Patient was in [place] for last 4 years and prior to that in [place]. His wife is working as Scientist, research (physical sciences) for [employer]. Patient is not working and applying for disability. They have 2 kids but no biologic children.      Family History: The patient's family history includes Cancer in his paternal grandfather; Congestive Heart Failure in his maternal grandmother; Heart attack in his maternal grandfather, maternal grandmother, and paternal grandfather; Hypertension in his mother; Other in his mother; Polycythemia in his paternal uncle; Protein C deficiency (age of onset: 10) in his sister; Psoriasis in his mother; Stroke in his maternal grandfather, maternal grandmother, and paternal uncle.  ROS:   Please see the history of present illness.    All other systems are reviewed and are negative  EKGs/Labs/Other  Studies Reviewed:    EKG:  EKG is not ordered today.  The ekg ordered 05/07/2018 shows sinus rhythm rightward axis, no major repolarization abnormalities  Recent Labs: 04/29/2018: TSH 1.370 08/18/2018: ALT 22 08/25/2018: BUN 13; Creatinine, Ser 0.84; Potassium 3.6; Sodium 133 08/26/2018: Hemoglobin 9.2; Platelets 176  Recent Lipid Panel    Component Value Date/Time   CHOL 142 01/05/2018 1509   TRIG 45 01/05/2018 1509   HDL 52 01/05/2018 1509   CHOLHDL 2.7 01/05/2018 1509   LDLCALC 81 01/05/2018 1509    Physical Exam:    VS:  BP 112/87   Pulse (!) 104   Temp (!) 97.2 F (36.2 C)   Ht 5' 9"  (1.753 m)   Wt 208 lb 9.6 oz (94.6 kg)   SpO2 97%   BMI 30.80 kg/m     Wt Readings from Last 3 Encounters:  11/03/18 208 lb 9.6 oz (94.6 kg)  08/23/18 222 lb 3.2 oz (100.8 kg)  07/09/18 219 lb 12.8 oz (  99.7 kg)      General: Alert, oriented x3, no distress, borderline obese Head: no evidence of trauma, PERRL, EOMI, no exophtalmos or lid lag, no myxedema, no xanthelasma; normal ears, nose and oropharynx Neck: normal jugular venous pulsations and no hepatojugular reflux; brisk carotid pulses without delay and no carotid bruits Chest: clear to auscultation, no signs of consolidation by percussion or palpation, normal fremitus, symmetrical and full respiratory excursions Cardiovascular: normal position and quality of the apical impulse, regular rhythm, normal first and second heart sounds, no murmurs, rubs or gallops Abdomen: no tenderness or distention, no masses by palpation, no abnormal pulsatility or arterial bruits, normal bowel sounds, no hepatosplenomegaly Extremities: no clubbing, cyanosis or edema, moderate bilateral varicose veins in the calves and ankles; 2+ radial, ulnar and brachial pulses bilaterally; 2+ right femoral, posterior tibial and dorsalis pedis pulses; 2+ left femoral, posterior tibial and dorsalis pedis pulses; no subclavian or femoral bruits Neurological: grossly  nonfocal Psych: Normal mood and affect   ASSESSMENT:    1. Claudication ([place])   2. Chronic diastolic heart failure (HCC)   3. Palpitations   4. OSA (obstructive sleep apnea)   5. Protein C deficiency ([place])   6. Long term current use of anticoagulant    PLAN:    In order of problems listed above:  1. CHF: He describes bendopnea, but does not have overt evidence of severe hypervolemia.  Functional status difficult to assess since he is so sedentary.  High risk for syncope from orthostatic hypotension since he takes prazosin and need to be cautious with overdiuresis.  Will increase his furosemide slightly to 60 mg daily.  On his echocardiogram from October 2018 there was mild LVH and a mildly dilated left atrium, with a tissue Doppler velocities are only slightly decreased for his age.  His ankle swelling is not impressive today and could also be related to peripheral venous insufficiency and/or longstanding sleep apnea.   2. Claudication: I think his pulses are good.  He may have neurogenic claudication rather than arterial insufficiency-related claudication.  Will check ABIs. 3. Palpitations: Not a recent complaint.  Suspect they were due to sinus tachycardia. 4. OSA: Reports compliance with CPAP and denies daytime hypersomnolence 5. Protein C deficiency: Presumed cause of stroke.  On chronic anticoagulation.  No major bleeding events or trauma. 6. Anticoagulation: No bleeding problems.   Medication Adjustments/Labs and Tests Ordered: Current medicines are reviewed at length with the patient today.  Concerns regarding medicines are outlined above.  Orders Placed This Encounter  Procedures  . VAS [place] LE ART SEG MULTI (Segm&LE Reynauds)   Meds ordered this encounter  Medications  . furosemide (LASIX) 40 MG tablet    Sig: Take 1.5 tablets (60 mg total) by mouth daily.    Dispense:  30 tablet    Refill:  5    Patient Instructions  Medication Instructions:  TAKE: Furosemide 60 mg  once daily (one and a half tablets)  If you need a refill on your cardiac medications before your next appointment, please call your pharmacy.   Lab work: None ordered If you have labs (blood work) drawn today and your tests are completely normal, you will receive your results only by: [name] MyChart Message (if you have MyChart) OR . A paper copy in the mail If you have any lab test that is abnormal or we need to change your treatment, we will call you to review the results.  Testing/Procedures: Your physician has requested that you have  an ankle brachial index (ABI). During this test an ultrasound and blood pressure cuff are used to evaluate the arteries that supply the arms and legs with blood. Allow thirty minutes for this exam. There are no restrictions or special instructions.   Follow-Up: At [facility], you and your health needs are our priority.  As part of our continuing mission to provide you with exceptional heart care, we have created designated Provider Care Teams.  These Care Teams include your primary Cardiologist (physician) and Advanced Practice Providers (APPs -  Physician Assistants and Nurse Practitioners) who all work together to provide you with the care you need, when you need it. You will need a follow up appointment in 6 months.  Please call our office 2 months in advance to schedule this appointment.  You may see Jesse Klein, MD or one of the following Advanced Practice Providers on your designated Care Team: [place], [place] . Fabian Sharp, PA-C  Call Monday with your current [phone]     Signed, Jesse Klein, MD  11/04/2018 10:55 AM    [place]

## 2018-11-03 NOTE — Patient Instructions (Addendum)
Medication Instructions:  TAKE: Furosemide 60 mg once daily (one and a half tablets)  If you need a refill on your cardiac medications before your next appointment, please call your pharmacy.   Lab work: None ordered If you have labs (blood work) drawn today and your tests are completely normal, you will receive your results only by: [name] MyChart Message (if you have MyChart) OR . A paper copy in the mail If you have any lab test that is abnormal or we need to change your treatment, we will call you to review the results.  Testing/Procedures: Your physician has requested that you have an ankle brachial index (ABI). During this test an ultrasound and blood pressure cuff are used to evaluate the arteries that supply the arms and legs with blood. Allow thirty minutes for this exam. There are no restrictions or special instructions.   Follow-Up: At [facility], you and your health needs are our priority.  As part of our continuing mission to provide you with exceptional heart care, we have created designated Provider Care Teams.  These Care Teams include your primary Cardiologist (physician) and Advanced Practice Providers (APPs -  Physician Assistants and Nurse Practitioners) who all work together to provide you with the care you need, when you need it. You will need a follow up appointment in 6 months.  Please call our office 2 months in advance to schedule this appointment.  You may see Sanda Klein, MD or one of the following Advanced Practice Providers on your designated Care Team: [place], [place] . Fabian Sharp, PA-C  Call Monday with your current weight-[phone]

## 2018-11-04 ENCOUNTER — Ambulatory Visit (INDEPENDENT_AMBULATORY_CARE_PROVIDER_SITE_OTHER): Payer: Medicare Other | Admitting: Psychology

## 2018-11-04 ENCOUNTER — Other Ambulatory Visit: Payer: Self-pay | Admitting: Cardiovascular Disease

## 2018-11-04 DIAGNOSIS — I739 Peripheral vascular disease, unspecified: Secondary | ICD-10-CM

## 2018-11-04 DIAGNOSIS — F4312 Post-traumatic stress disorder, chronic: Secondary | ICD-10-CM | POA: Diagnosis not present

## 2018-11-05 ENCOUNTER — Other Ambulatory Visit: Payer: Self-pay

## 2018-11-05 ENCOUNTER — Encounter (HOSPITAL_COMMUNITY): Payer: Medicare Other

## 2018-11-05 ENCOUNTER — Ambulatory Visit (HOSPITAL_COMMUNITY)
Admission: RE | Admit: 2018-11-05 | Discharge: 2018-11-05 | Disposition: A | Discharge status: Home / Self Care | Payer: Medicare Other | Source: Ambulatory Visit | Attending: Cardiology | Admitting: Cardiology

## 2018-11-05 DIAGNOSIS — I739 Peripheral vascular disease, unspecified: Secondary | ICD-10-CM | POA: Diagnosis not present

## 2018-11-09 ENCOUNTER — Ambulatory Visit (INDEPENDENT_AMBULATORY_CARE_PROVIDER_SITE_OTHER): Payer: Medicare Other | Admitting: Psychology

## 2018-11-09 DIAGNOSIS — F4312 Post-traumatic stress disorder, chronic: Secondary | ICD-10-CM | POA: Diagnosis not present

## 2018-11-10 ENCOUNTER — Encounter: Payer: Self-pay | Admitting: Physical Medicine & Rehabilitation

## 2018-11-10 ENCOUNTER — Encounter: Payer: Medicare Other | Attending: Physical Medicine & Rehabilitation | Admitting: Physical Medicine & Rehabilitation

## 2018-11-10 ENCOUNTER — Other Ambulatory Visit: Payer: Self-pay

## 2018-11-10 VITALS — BP 115/80 | HR 104 | Temp 97.9°F | Ht 69.0 in | Wt 207.7 lb

## 2018-11-10 DIAGNOSIS — F418 Other specified anxiety disorders: Secondary | ICD-10-CM | POA: Insufficient documentation

## 2018-11-10 DIAGNOSIS — R209 Unspecified disturbances of skin sensation: Secondary | ICD-10-CM | POA: Insufficient documentation

## 2018-11-10 DIAGNOSIS — L4050 Arthropathic psoriasis, unspecified: Secondary | ICD-10-CM

## 2018-11-10 DIAGNOSIS — Z79899 Other long term (current) drug therapy: Secondary | ICD-10-CM | POA: Diagnosis not present

## 2018-11-10 DIAGNOSIS — Z87890 Personal history of sex reassignment: Secondary | ICD-10-CM | POA: Diagnosis not present

## 2018-11-10 DIAGNOSIS — Z9181 History of falling: Secondary | ICD-10-CM | POA: Insufficient documentation

## 2018-11-10 DIAGNOSIS — I69398 Other sequelae of cerebral infarction: Secondary | ICD-10-CM | POA: Diagnosis not present

## 2018-11-10 DIAGNOSIS — K900 Celiac disease: Secondary | ICD-10-CM | POA: Insufficient documentation

## 2018-11-10 DIAGNOSIS — Z86718 Personal history of other venous thrombosis and embolism: Secondary | ICD-10-CM | POA: Insufficient documentation

## 2018-11-10 DIAGNOSIS — M5416 Radiculopathy, lumbar region: Secondary | ICD-10-CM

## 2018-11-10 DIAGNOSIS — M545 Low back pain: Secondary | ICD-10-CM | POA: Diagnosis not present

## 2018-11-10 DIAGNOSIS — D6859 Other primary thrombophilia: Secondary | ICD-10-CM | POA: Insufficient documentation

## 2018-11-10 DIAGNOSIS — D751 Secondary polycythemia: Secondary | ICD-10-CM | POA: Insufficient documentation

## 2018-11-10 DIAGNOSIS — M13862 Other specified arthritis, left knee: Secondary | ICD-10-CM | POA: Diagnosis not present

## 2018-11-10 DIAGNOSIS — I69351 Hemiplegia and hemiparesis following cerebral infarction affecting right dominant side: Secondary | ICD-10-CM | POA: Diagnosis not present

## 2018-11-10 DIAGNOSIS — I69320 Aphasia following cerebral infarction: Secondary | ICD-10-CM | POA: Diagnosis not present

## 2018-11-10 DIAGNOSIS — G894 Chronic pain syndrome: Secondary | ICD-10-CM

## 2018-11-10 DIAGNOSIS — Z5181 Encounter for therapeutic drug level monitoring: Secondary | ICD-10-CM | POA: Diagnosis not present

## 2018-11-10 DIAGNOSIS — Z7901 Long term (current) use of anticoagulants: Secondary | ICD-10-CM | POA: Insufficient documentation

## 2018-11-10 DIAGNOSIS — G8929 Other chronic pain: Secondary | ICD-10-CM | POA: Insufficient documentation

## 2018-11-10 DIAGNOSIS — M7522 Bicipital tendinitis, left shoulder: Secondary | ICD-10-CM | POA: Insufficient documentation

## 2018-11-10 DIAGNOSIS — S43002A Unspecified subluxation of left shoulder joint, initial encounter: Secondary | ICD-10-CM | POA: Diagnosis not present

## 2018-11-10 DIAGNOSIS — Z79891 Long term (current) use of opiate analgesic: Secondary | ICD-10-CM | POA: Diagnosis not present

## 2018-11-10 MED ORDER — OXYCODONE HCL 10 MG PO TABS: 10.0000 mg | ORAL_TABLET | Freq: Four times a day (QID) | ORAL | 0 refills | Status: DC | PRN

## 2018-11-10 MED ORDER — MORPHINE SULFATE ER 60 MG PO CP24: 60.0000 mg | ORAL_CAPSULE | Freq: Two times a day (BID) | ORAL | 0 refills | Status: DC

## 2018-11-10 NOTE — Progress Notes (Signed)
Subjective:    Patient ID: Jesse Bowers, adult    DOB: 06-03-1965, 53 y.o.   MRN: [id number]  HPI   Jesse Bowers is back regarding his chronic pain and gait disorder. His left knee rom has improved. He had his right knee replaced in June. He has had a less complicated course with the right knee. Jesse Bowers has had outpt therapy to maximize his lower extremity gait and ROM and knee rom has increased dramatically. Knees can be tender at times but it is more manageable too.   His pain levels are stable. Back pain is ongoing but manageable with meds. Shoulder bother him at times too.    Pain Inventory Average Pain 7 Pain Right Now 8 My pain is constant, sharp, stabbing, tingling and aching  In the last 24 hours, has pain interfered with the following? General activity 8 Relation with others 9 Enjoyment of life 10 What TIME of day is your pain at its worst? daytime night Sleep (in general) Poor  Pain is worse with: walking, bending and standing Pain improves with: rest, heat/ice, therapy/exercise and medication Relief from Meds: 3  Mobility use a walker how many minutes can you walk? 1-5  ability to climb steps?  no do you drive?  no use a wheelchair transfers alone  Function disabled: date disabled 09/17/12 I need assistance with the following:  dressing, bathing, meal prep, household duties and shopping  Neuro/Psych bladder control problems weakness numbness tremor tingling trouble walking spasms anxiety  Prior Studies Any changes since last visit?  yes  Physicians involved in your care Any changes since last visit?  no   Family History  Problem Relation Age of Onset  . Stroke Maternal Grandfather        19  . Heart attack Maternal Grandfather   . Hypertension Mother   . Psoriasis Mother   . Other Mother        meningioma developed ~2019  . Cancer Paternal Grandfather   . Heart attack Paternal Grandfather   . Stroke Paternal Uncle        age 36  . Polycythemia  Paternal Uncle   . Stroke Maternal Grandmother   . Congestive Heart Failure Maternal Grandmother   . Heart attack Maternal Grandmother   . Protein C deficiency Sister 54       Miscarriages   Social History   Socioeconomic History  . Marital status: Married    Spouse name: Not on file  . Number of children: 2  . Years of education: 4y college  . Highest education level: Not on file  Occupational History  . Occupation: Pediatric Nurse practitioner    Comment: Not working since [place] 2015  Social Needs  . Financial resource strain: Not on file  . Food insecurity    Worry: Not on file    Inability: Not on file  . Transportation needs    Medical: Not on file    Non-medical: Not on file  Tobacco Use  . Smoking status: Never Smoker  . Smokeless tobacco: Never Used  Substance and Sexual Activity  . Alcohol use: Yes    Frequency: Never    Comment: social  . Drug use: No  . Sexual activity: Yes    Birth control/protection: None    Comment: patient is a transgender on testosterone shots, no biological kids  Lifestyle  . Physical activity    Days per week: Not on file    Minutes per session: Not on file  .  Stress: Not on file  Relationships  . Social Herbalist on phone: Not on file    Gets together: Not on file    Attends religious service: Not on file    Active member of club or organization: Not on file    Attends meetings of clubs or organizations: Not on file    Relationship status: Not on file  Other Topics Concern  . Not on file  Social History Narrative   Education 4 year college, former Therapist, sports X 15 years, pediatric nurse practitioner x 6 years, did NP degree from [employer] [place]. Relocated to [place] about 2 months ago from [place], [place]. Patient was in [place] for last 4 years and prior to that in [place]. His wife is working as Scientist, research (physical sciences) for [employer]. Patient is not working and applying for disability. They have 2 kids but no biologic children.     Past Surgical History:  Procedure Laterality Date  . ABDOMINAL HYSTERECTOMY Bilateral 1994   TAH, BSO- tranverse incision at 53 yo  . ANKLE ARTHROSCOPY WITH RECONSTRUCTION Right 2007  . CHOLECYSTECTOMY     laparoscopic  . COLONOSCOPY     x3  . EYE SURGERY     Left eye 03/02/2018, right 02/15/2018  . HIP ARTHROSCOPY W/ LABRAL REPAIR Right 05/11/2013   acetabular labral tear 03/30/2013  . KNEE ARTHROPLASTY Right   . KNEE JOINT MANIPULATION Left    x3 under anesthesia  . KNEE SURGERY Bilateral 1984   Right ACL, left PCL repair  . LITHOTRIPSY  2005  . LIVER BIOPSY  2013   normal results.  [name] MASTECTOMY Bilateral    prior to 2009  . MOUTH SURGERY    . NASAL SEPTUM SURGERY N/A 09/20/2015   by ENT Dr. [name]  . OVARIAN CYST SURGERY Left    size of grapefruit, was informed that she had shortened vagina  . SHOULDER SURGERY Bilateral    Right 08/15/2016, Left 11/15/2016  . THUMB ARTHROSCOPY Left   . THYROIDECTOMY, PARTIAL Left 2008  . TOTAL KNEE ARTHROPLASTY Right 08/23/2018   Procedure: TOTAL KNEE ARTHROPLASTY;  Surgeon: [name], MD;  Location: [facility];  Service: Orthopedics;  Laterality: Right;  9mn  . TOTAL KNEE REVISION Left 02/06/2016   Procedure: LEFT TOTAL KNEE REVISION;  Surgeon: FGaynelle Arabian MD;  Location: [facility];  Service: Orthopedics;  Laterality: Left;  . TOTAL KNEE REVISION Left 04/22/2017   Procedure: Left knee polyethylene revision;  Surgeon: AGaynelle Arabian MD;  Location: [facility];  Service: Orthopedics;  Laterality: Left;  . UPPER GI ENDOSCOPY  2003   Past Medical History:  Diagnosis Date  . Abnormal weight loss   . Anxiety   . Arthritis   . Celiac disease   . Cervical neck pain with evidence of disc disease    patient has a cyst   . Chronic constipation   . Chronic diastolic heart failure ([place]    Pt. denies  . Chronic pain   . Degenerative disc disease at L5-S1 level    with stenosis  . DVT (deep venous thrombosis) ([facility])    Right upper arm,  bilateral leg  . Eczema    inguinal, feet  . Elevated liver enzymes   . 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 Family history of adverse reaction to anesthesia    family has problems with anesthesia of nausea and vomiting   . 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 GERD (gastroesophageal reflux disease)  History of in 20's  . Gluten enteropathy   . H/O parotitis    right   . Hard of hearing   . History of kidney stones   . History of retinal tear    Bilateral  . History of staph infection    required wound vac  . Hx-TIA (transient ischemic attack)    2015  . LVH (left ventricular hypertrophy) 12/15/2016   Mild, noted on ECHO  . MVP (mitral valve prolapse)   . NAFL (nonalcoholic fatty liver)   . Neck pain   . Neuromuscular disorder ([place])    bilateral neuropathy feet.  . Pneumonia 12/17/2010  . Polycythemia   . Polycythemia, secondary   . PONV (postoperative nausea and vomiting)   . Protein C deficiency ([place])    Dr. Anne Fu  . Psoriasis    16 X10 cm psoriatic rash on sole of left foot ; open and occ scant bleeding;   . psoriatic arthritis   . PTSD (post-traumatic stress disorder)   . Scaphoid fracture of wrist 09/23/2013  . Seizure ([place])    childhood, medication until age 21 then weanned completely off  . Sleep apnea    split night study last done by Dr. [name] 06/18/15 shows severe OSA, CSA, and hypersomnia, rec bipap  . Splenomegaly   . Stenosis of ureteropelvic junction (UPJ)    left  . Stroke [facility])    CVA vs TIA in left cerebrum causing slight right sided weakness-Dr. [name] follows  . 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 Tachycardia    hx of   . Transfusion history    past history- none recent, after surgeries due to blood loss  . Wears glasses   . Wears hearing aid    BP 115/80   Pulse (!) 104   Temp 97.9 F (36.6 C)   Ht 5' 9"  (1.753 m)   Wt 207 lb 11.2 oz (94.2 kg)   SpO2 95%   BMI 30.67 kg/m   Opioid Risk  Score:   Fall Risk Score:  `1  Depression screen PHQ 2/9  Depression screen [facility] 2/9 02/02/2018 09/23/2017 08/07/2017  Decreased Interest 0 0 3  Down, Depressed, Hopeless 0 0 3  PHQ - 2 Score 0 0 6  Some recent data might be hidden     Review of Systems  Constitutional: Positive for appetite change.  HENT: Negative.   Eyes: Negative.   Respiratory: Positive for cough.   Cardiovascular: Negative.   Gastrointestinal: Negative.   Endocrine: Negative.   Musculoskeletal: Positive for gait problem.  Skin: Negative.   Allergic/Immunologic: Negative.   Neurological: Positive for tremors, weakness and numbness.  Hematological: Negative.   Psychiatric/Behavioral: The patient is nervous/anxious.   All other systems reviewed and are negative.      Objective:   Physical Exam General: No acute distress HEENT: EOMI, oral membranes moist Cards: reg rate  Chest: normal effort Abdomen: Soft, NT, ND Skin: dry, intact Extremities: no edema Neuro:Pt is pleasant and alert and oriented x 3.   Strength is grossly 3 to 4 out of 5 in all 4's.     reflexes remain hyperactive. patchy sensory loss in limbs.  Musculoskeletal:ongoing pain with int/ext rotation left more than right. Knee flexion greater than 100 bilaterally Psych:pleasant and cooperative   Assessment & Plan:  1. Psoriatic arthritis with pain in multiple areas, most prominently feet, hands, elbows.Hispain is also related to his prior CVA and associated  motor/sensory changepatient's complaining of increased s/spasticity. 2. 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  3. Hx of left total knee revisionagain on 04/21/17 per [facility] and right TKA 08/2018 4. Chronic low back pain---MRI withsevere DDD at L5-S1. Left S1 radiculopathy? S/p LEFT L5-S1 ESI 5. Protein C deficiency   6. Left  shoulder subluxation, bicipital tendonitis  7. Central sleep apnea  8. Polycythemia  9. Depression with anxiety. 10. Left lateral epidondylitis 11. 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 Most recent cervical MRI (07/08/17) without significant change 12. 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 [facility]  Plan:  1.Continue effexor 3104m xr daily 2.Maintainhome exercise program to tolerance  -AFO per [facility] 3.Voltaren gel to hands, feet, knees, elbows--- d 4.Continue baclofen for lower extermity spasms/back pain.20 mg 4 times daily #120. 5. Refilled kadian [id number] #60and oxycodone [id number] both today 11/10/18 -We will continue the controlled substance monitoring program, this consists of regular clinic visits, examinations, routine drug screening, pill counts as well [place] use [place].     -Medication was refilled and a second prescription was sent to the patient's pharmacy for next month -[facility] 6.  Consider course of land/aquatic therapy after he completes ortho rehab to maximize gait. He has an opportunity with improved knee function to make gains 7.Has had goodresults with left L5-S1 translaminar injection per Dr. KirJeneen Montgomeryd injection perhaps at some point as needed. Not indicated right now  8.  constipaiton:  -off relistor d/t sweating  -continue miralax                 Fifteen minutes of face to face patient care time were spent during this visit. All questions were encouraged and answered.  Follow up with me or NP in 2 months.  .[name]

## 2018-11-10 NOTE — Patient Instructions (Signed)
WE WILL CONSIDER AQUATIC/LAND THERAPY AFTER YOU FINISH UP ORTHO REHAB

## 2018-11-11 ENCOUNTER — Ambulatory Visit (INDEPENDENT_AMBULATORY_CARE_PROVIDER_SITE_OTHER): Payer: Medicare Other | Admitting: Psychology

## 2018-11-11 DIAGNOSIS — F4312 Post-traumatic stress disorder, chronic: Secondary | ICD-10-CM | POA: Diagnosis not present

## 2018-11-11 DIAGNOSIS — F4323 Adjustment disorder with mixed anxiety and depressed mood: Secondary | ICD-10-CM

## 2018-11-15 ENCOUNTER — Encounter: Payer: Self-pay | Admitting: Urology

## 2018-11-15 LAB — DRUG TOX MONITOR 1 W/CONF, ORAL FLD
Alprazolam: NEGATIVE ng/mL (ref <0.50)
Amphetamines: NEGATIVE ng/mL (ref <10)
Barbiturates: NEGATIVE ng/mL (ref <10)
Benzodiazepines: POSITIVE ng/mL — AB (ref <0.50)
Buprenorphine: NEGATIVE ng/mL (ref <0.10)
Chlordiazepoxide: NEGATIVE ng/mL (ref <0.50)
Clonazepam: NEGATIVE ng/mL (ref <0.50)
Cocaine: NEGATIVE ng/mL (ref <5.0)
Codeine: NEGATIVE ng/mL (ref <2.5)
Diazepam: NEGATIVE ng/mL (ref <0.50)
Dihydrocodeine: NEGATIVE ng/mL (ref <2.5)
Fentanyl: NEGATIVE ng/mL (ref <0.10)
Flunitrazepam: NEGATIVE ng/mL (ref <0.50)
Flurazepam: NEGATIVE ng/mL (ref <0.50)
Heroin Metabolite: NEGATIVE ng/mL (ref <1.0)
Hydrocodone: NEGATIVE ng/mL (ref <2.5)
Hydromorphone: NEGATIVE ng/mL (ref <2.5)
Lorazepam: NEGATIVE ng/mL (ref <0.50)
MARIJUANA: NEGATIVE ng/mL (ref <2.5)
MDMA: NEGATIVE ng/mL (ref <10)
Meprobamate: NEGATIVE ng/mL (ref <2.5)
Methadone: NEGATIVE ng/mL (ref <5.0)
Midazolam: NEGATIVE ng/mL (ref <0.50)
Morphine: 28.7 ng/mL — ABNORMAL HIGH (ref <2.5)
Nicotine Metabolite: NEGATIVE ng/mL (ref <5.0)
Nordiazepam: 4.49 ng/mL — ABNORMAL HIGH (ref <0.50)
Norhydrocodone: NEGATIVE ng/mL (ref <2.5)
Noroxycodone: 16.4 ng/mL — ABNORMAL HIGH (ref <2.5)
Opiates: POSITIVE ng/mL — AB (ref <2.5)
Oxazepam: NEGATIVE ng/mL (ref <0.50)
Oxycodone: 42.1 ng/mL — ABNORMAL HIGH (ref <2.5)
Oxymorphone: NEGATIVE ng/mL (ref <2.5)
Phencyclidine: NEGATIVE ng/mL (ref <10)
Tapentadol: NEGATIVE ng/mL (ref <5.0)
Temazepam: NEGATIVE ng/mL (ref <0.50)
Tramadol: NEGATIVE ng/mL (ref <5.0)
Triazolam: NEGATIVE ng/mL (ref <0.50)
Zolpidem: NEGATIVE ng/mL (ref <5.0)

## 2018-11-15 LAB — DRUG TOX ALC METAB W/CON, ORAL FLD: Alcohol Metabolite: NEGATIVE ng/mL (ref <25)

## 2018-11-16 ENCOUNTER — Ambulatory Visit (INDEPENDENT_AMBULATORY_CARE_PROVIDER_SITE_OTHER): Payer: Medicare Other | Admitting: Psychology

## 2018-11-16 ENCOUNTER — Encounter: Payer: Self-pay | Admitting: *Deleted

## 2018-11-16 DIAGNOSIS — F4312 Post-traumatic stress disorder, chronic: Secondary | ICD-10-CM | POA: Diagnosis not present

## 2018-11-17 ENCOUNTER — Telehealth: Payer: Self-pay | Admitting: *Deleted

## 2018-11-17 NOTE — Telephone Encounter (Signed)
Oral swab drug screen was consistent for prescribed medications. He is prescribed Tranxene which metabolizes to nordiazepam.

## 2018-11-18 ENCOUNTER — Ambulatory Visit (INDEPENDENT_AMBULATORY_CARE_PROVIDER_SITE_OTHER): Payer: Medicare Other | Admitting: Psychology

## 2018-11-18 DIAGNOSIS — F4312 Post-traumatic stress disorder, chronic: Secondary | ICD-10-CM

## 2018-11-19 ENCOUNTER — Other Ambulatory Visit: Payer: Self-pay

## 2018-11-19 NOTE — Progress Notes (Signed)
[place]   [phone] [phone]   Clinic Follow up Note   Patient Care Team: Concepcion Elk, MD as PCP - General (Internal Medicine) Sanda Klein, MD as PCP - Cardiology (Cardiology) Bernadene Bell, MD (Inactive) as Consulting Physician (Hematology) Sheryn Bison, MD as Referring Physician (Dermatology) Elease Hashimoto, Trixie Deis, MD as Referring Physician (Gastroenterology) [name], MD as Consulting Physician (Hematology) Rozetta Nunnery, MD as Consulting Physician (Otolaryngology) Doree Fudge, PhD as Consulting Physician (Psychology) Lendon Colonel, MD as Referring Physician (Psychiatry) Meredith Staggers, MD as Consulting Physician (Physical Medicine and Rehabilitation) Hermelinda Medicus, MD as Consulting Physician (Rheumatology) [name], MD as Consulting Physician (Orthopedic Surgery) Justice Britain, MD as Consulting Physician (Orthopedic Surgery) Hollice Espy, MD as Consulting Physician (Urology) Shawnee Knapp, MD (Family Medicine)  Date of Service:  11/24/2018  CHIEF COMPLAINT: F/u on DVT and polycythemia   CURRENT THERAPY:  Xarelto 20 mg once daily, phlebotomy if hematocrit above 47%  INTERVAL HISTORY:  Jesse Bowers is here for a follow up DVT and Polycythemia. He was last seen by me 8 months ago. He presents to the clinic alone. He notes he is doing well. He is s/p right knee replacement in 08/2018. It has healed. He is able to ambulate for 75 feet with a walker. He goes to PT. He is currently in wheelchair. He plans to start neuro rehab in 12/2018. He notes having ear infection for 3 weeks which was treated with procedure and cipro. He plans to continue RA injections with COSENTYX. He notes his Psoriasis is flaring due to treatment. This is present on scalp, nose, groin and genitals.    REVIEW OF SYSTEMS:   Constitutional: Denies fevers, chills or abnormal weight loss Eyes: Denies blurriness of vision Ears, nose,  mouth, throat, and face: Denies mucositis or sore throat Respiratory: Denies cough, dyspnea or wheezes Cardiovascular: Denies palpitation, chest discomfort or lower extremity swelling Gastrointestinal:  Denies nausea, heartburn or change in bowel habits Skin: (+) Psoriasis MSK:(+) s/p right knee replacement, ambulates minimally with walker. In wheelchair currently.(+)  RA  Lymphatics: Denies new lymphadenopathy or easy bruising Neurological:Denies numbness, tingling or new weaknesses Behavioral/Psych: Mood is stable, no new changes  All other systems were reviewed with the patient and are negative.  MEDICAL HISTORY:  Past Medical History:  Diagnosis Date  . Abnormal weight loss   . Anxiety   . Arthritis   . Celiac disease   . Cervical neck pain with evidence of disc disease    patient has a cyst   . Chronic constipation   . Chronic diastolic heart failure ([place])    Pt. denies  . Chronic pain   . Degenerative disc disease at L5-S1 level    with stenosis  . DVT (deep venous thrombosis) (H[facility])    Right upper arm, bilateral leg  . Eczema    inguinal, feet  . Elevated liver enzymes   . 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 Family history of adverse reaction to anesthesia    family has problems with anesthesia of nausea and vomiting   . 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 GERD (gastroesophageal reflux disease)    History of in 20's  . Gluten enteropathy   . H/O parotitis    right   . Hard of hearing   . History of kidney stones   . History of retinal tear    Bilateral  . History of staph infection    required wound  vac  . Hx-TIA (transient ischemic attack)    2015  . LVH (left ventricular hypertrophy) 12/15/2016   Mild, noted on ECHO  . MVP (mitral valve prolapse)   . NAFL (nonalcoholic fatty liver)   . Neck pain   . Neuromuscular disorder ([place])    bilateral neuropathy feet.  . Pneumonia 12/17/2010  . Polycythemia   . Polycythemia, secondary   . PONV  (postoperative nausea and vomiting)   . Protein C deficiency ([place])    Dr. Anne Fu  . Psoriasis    16 X10 cm psoriatic rash on sole of left foot ; open and occ scant bleeding;   . psoriatic arthritis   . PTSD (post-traumatic stress disorder)   . Scaphoid fracture of wrist 09/23/2013  . Seizure ([place])    childhood, medication until age 72 then weanned completely off  . Sleep apnea    split night study last done by Dr. [name] 06/18/15 shows severe OSA, CSA, and hypersomnia, rec bipap  . Splenomegaly   . Stenosis of ureteropelvic junction (UPJ)    left  . Stroke [facility])    CVA vs TIA in left cerebrum causing slight right sided weakness-Dr. [name] follows  . 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 Tachycardia    hx of   . Transfusion history    past history- none recent, after surgeries due to blood loss  . Wears glasses   . Wears hearing aid     SURGICAL HISTORY: Past Surgical History:  Procedure Laterality Date  . ABDOMINAL HYSTERECTOMY Bilateral 1994   TAH, BSO- tranverse incision at 53 yo  . ANKLE ARTHROSCOPY WITH RECONSTRUCTION Right 2007  . CHOLECYSTECTOMY     laparoscopic  . COLONOSCOPY     x3  . EYE SURGERY     Left eye 03/02/2018, right 02/15/2018  . HIP ARTHROSCOPY W/ LABRAL REPAIR Right 05/11/2013   acetabular labral tear 03/30/2013  . KNEE ARTHROPLASTY Right   . KNEE JOINT MANIPULATION Left    x3 under anesthesia  . KNEE SURGERY Bilateral 1984   Right ACL, left PCL repair  . LITHOTRIPSY  2005  . LIVER BIOPSY  2013   normal results.  [name] MASTECTOMY Bilateral    prior to 2009  . MOUTH SURGERY    . NASAL SEPTUM SURGERY N/A 09/20/2015   by ENT Dr. [name]  . OVARIAN CYST SURGERY Left    size of grapefruit, was informed that she had shortened vagina  . SHOULDER SURGERY Bilateral    Right 08/15/2016, Left 11/15/2016  . THUMB ARTHROSCOPY Left   . THYROIDECTOMY, PARTIAL Left 2008  . TOTAL KNEE ARTHROPLASTY Right 08/23/2018   Procedure: TOTAL KNEE  ARTHROPLASTY;  Surgeon: [name], MD;  Location: [facility];  Service: Orthopedics;  Laterality: Right;  30mn  . TOTAL KNEE REVISION Left 02/06/2016   Procedure: LEFT TOTAL KNEE REVISION;  Surgeon: FGaynelle Arabian MD;  Location: [facility];  Service: Orthopedics;  Laterality: Left;  . TOTAL KNEE REVISION Left 04/22/2017   Procedure: Left knee polyethylene revision;  Surgeon: AGaynelle Arabian MD;  Location: [facility];  Service: Orthopedics;  Laterality: Left;  . UPPER GI ENDOSCOPY  2003    I have reviewed the social history and family history with the patient and they are unchanged from previous note.  ALLERGIES:  is allergic to gabapentin; penicillin g; sulfa antibiotics; tegaderm chg dressing  [chlorhexidine]; vancomycin; duloxetine; tegaderm ag mesh [silver]; citalopram; wheat bran; ibuprofen; nortriptyline; pregabalin; and  sulfacetamide sodium-sulfur.  MEDICATIONS:  Current Outpatient Medications  Medication Sig Dispense Refill  . acyclovir ointment (ZOVIRAX) 5 % Apply 1 application topically daily as needed (cold sores).   0  . antiseptic oral rinse (BIOTENE) LIQD 15 mLs by Mouth Rinse route as needed for dry mouth.    [name] augmented betamethasone dipropionate (DIPROLENE-AF) 0.05 % ointment Apply 1 application topically 2 (two) times daily.    . baclofen (LIORESAL) 20 MG tablet Take 1 tablet (20 mg total) by mouth 4 (four) times daily. 120 tablet 3  . Calcipotriene-Betameth Diprop (ENSTILAR) 0.005-0.064 % FOAM Apply 1 application topically 2 (two) times daily.     . clobetasol (TEMOVATE) 0.05 % GEL Apply 1 application topically 2 (two) times daily as needed (itching).    . clorazepate (TRANXENE) 15 MG tablet Take 15 mg by mouth at bedtime.    . clorazepate (TRANXENE) 7.5 MG tablet Take 7.5 mg by mouth 2 (two) times daily.     [name] desonide (DESOWEN) 0.05 % ointment Apply 1 application topically 2 (two) times daily as needed (psoriasis).   2  . diclofenac sodium (VOLTAREN) 1 % GEL APPLY TOPICALLY TO  BOTH HANDS 3 TIMES DAILY. 300 g 4  . Emollient (CERAVE) CREA Apply 1 application topically 2 (two) times a day.    . Fluocinolone Acetonide 0.01 % OIL Place 3 drops into both ears 2 (two) times daily as needed for itching.    . furosemide (LASIX) 40 MG tablet Take 1.5 tablets (60 mg total) by mouth daily. 30 tablet 5  . mirabegron ER (MYRBETRIQ) 25 MG TB24 tablet Take 1 tablet (25 mg total) by mouth daily. 90 tablet 3  . morphine (KADIAN) 60 MG 24 hr capsule Take 1 capsule (60 mg total) by mouth every 12 (twelve) hours. 60 capsule 0  . NEEDLE, DISP, 18 G 18G X 1-1/2" MISC 1 Units by Does not apply route once a week. 50 each 2  . NEEDLE, DISP, 23 G 23G X 1" MISC Hypodermic Needles 23 gauge x 1"  USE ONCE A WEEK    . NEEDLE, DISP, 23 G 23G X 3/4" MISC 1 Units by Does not apply route once a week. 100 each 1  . neomycin-polymyxin-hydrocortisone (CORTISPORIN) 3.5-10000-1 OTIC suspension Place 4 drops into both ears 2 (two) times daily as needed (ear pain).   1  . Oxycodone HCl 10 MG TABS Take 1 tablet (10 mg total) by mouth every 6 (six) hours as needed. 120 tablet 0  . potassium chloride (K-DUR) 10 MEQ tablet TAKE K-DUR 2 TABLETS 10 MEQ DAILY (Patient taking differently: Take 20 mEq by mouth daily. ) 60 tablet 11  . prazosin (MINIPRESS) 2 MG capsule Take 4 mg by mouth at bedtime.     [name] Propylene Glycol (SYSTANE COMPLETE) 0.6 % SOLN Place 1-2 drops into both eyes 4 (four) times daily as needed (dry eyes).     . rivaroxaban (XARELTO) 20 MG TABS tablet Take 1 tablet (20 mg total) by mouth at bedtime. TAKE 1 TABLET DAILY BEFORE BEDTIME 90 tablet 3  . Secukinumab (COSENTYX SENSOREADY PEN) 150 MG/ML SOAJ Inject 300 mg into the skin every 28 (twenty-eight) days.     . Syringe, Disposable, 1 ML MISC 1 Units by Does not apply route once a week. 60 each 2  . SYRINGE-NEEDLE, DISP, 3 ML (B-D 3CC LUER-LOK SYR 18GX1-1/2) 18G X 1-1/2" 3 ML MISC BD Luer-Lok Syringe 3 mL 18 x 1 1/2"  USE ONCE A WEEK    .  testosterone  cypionate (DEPOTESTOSTERONE CYPIONATE) 200 MG/ML injection INJECT 0.5 MLS (100 MG TOTAL) INTO THE MUSCLE ONCE A WEEK. 10 mL 0  . ursodiol (ACTIGALL) 250 MG tablet Take 250 mg by mouth 2 (two) times daily with a meal.     . ursodiol (ACTIGALL) 500 MG tablet Take 500 mg by mouth 2 (two) times daily with a meal.     . venlafaxine XR (EFFEXOR-XR) 150 MG 24 hr capsule Take 2 capsules (300 mg total) by mouth daily with breakfast. 60 capsule 5   No current facility-administered medications for this visit.     PHYSICAL EXAMINATION: ECOG PERFORMANCE STATUS: 3 - Symptomatic, >50% confined to bed  Vitals:   11/24/18 1415  BP: (!) 122/91  Pulse: (!) 115  Resp: 17  Temp: 98.3 F (36.8 C)  SpO2: 98%   Filed Weights   11/24/18 1415  Weight: 204 lb 12.8 oz (92.9 kg)    GENERAL:alert, no distress and comfortable SKIN: skin color, texture, turgor are normal, no rashes or significant lesions EYES: normal, Conjunctiva are pink and non-injected, sclera clear  NECK: supple, thyroid normal size, non-tender, without nodularity LYMPH:  no palpable lymphadenopathy in the cervical, axillary  LUNGS: clear to auscultation and percussion with normal breathing effort HEART: regular rate & rhythm and no murmurs and no lower extremity edema ABDOMEN:abdomen soft, non-tender and normal bowel sounds Musculoskeletal:no cyanosis of digits and no clubbing  NEURO: alert & oriented x 3 with fluent speech, no focal motor/sensory deficits  LABORATORY DATA:  I have reviewed the data as listed CBC Latest Ref Rng & Units 11/24/2018 08/26/2018 08/25/2018  WBC 4.0 - 10.5 K/uL 5.3 6.8 7.9  Hemoglobin 13.0 - 17.0 g/dL 13.9 9.2(L) 9.3(L)  Hematocrit 39.0 - 52.0 % 46.1 30.5(L) 31.4(L)  Platelets 150 - 400 K/uL 233 176 148(L)     CMP Latest Ref Rng & Units 08/25/2018 08/24/2018 08/18/2018  Glucose 70 - 99 mg/dL 138(H) 178(H) 115(H)  BUN 6 - 20 mg/dL 13 19 17   Creatinine 0.61 - 1.24 mg/dL 0.84 0.96 1.08  Sodium 135 - 145  mmol/L 133(L) 135 139  Potassium 3.5 - 5.1 mmol/L 3.6 3.9 4.0  Chloride 98 - 111 mmol/L 99 101 102  CO2 22 - 32 mmol/L 23 25 26   Calcium 8.9 - 10.3 mg/dL 8.1(L) 8.3(L) 8.9  Total Protein 6.5 - 8.1 g/dL - - 7.1  Total Bilirubin 0.3 - 1.2 mg/dL - - 0.4  Alkaline Phos 38 - 126 U/L - - 169(H)  AST 15 - 41 U/L - - 28  ALT 0 - 44 U/L - - 22      RADIOGRAPHIC STUDIES: I have personally reviewed the radiological images as listed and agreed with the findings in the report. No results found.   ASSESSMENT & PLAN:  Jesse Bowers is a 53 y.o. adult with    1. Secondary Polycythemia JAK2 (-) -He's been having high H/H since his 58s. He receives testosterone injections weekly. He currently undergoes phlebotomy as needed. His cut off is Hct 47 for phlebotomy given this is likely secondary polycythemia.  -Labs reviewed today, Hg 13.9 and Hct 46.1. No need for phlebotomy today  -We will continue lab every 3 months, phlebotomy if HCT above 47% -f/u in 6 months  -He opted to proceed with flu shot today   2. History of DVT and stroke, Protein C deficiency -Currently on Xarelto and will continue indefinitely.  -This was held for 4 days for his right knee surgery. He  did have mild to moderate bleeding from surgery.  -He notes he now has occasional bruising and mild gum bleeding. I encouraged him to avoid fall or injury.   3. History of CVA  4. 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 Brigitte Pulse -He know to keep his testosterone level low, due to previous history of DVT and stroke  5. RA -Currently on Secukinumab (COSENTYX). Continue -He notes his Psoriasis is flaring due to treatment. This is present on scalp, nose, groin and genitals.   6. Elevated alkaline phosphatase and transaminitis, mild splenomegaly  -f/u with PCP and GI -He notes he is back to normal liver function except Alkaline phos. He is on medication for this indefinitely.  -He previously had  splenomegaly.   7. Back Pain and muscular spasms -Currently on Morphine 60 mg every 12 hrs, Oxycodone 10 mg as needed, and baclofen. -f/u with Dr. [name]  8. Mild splenomegaly -f/u with   9. Gynecomastia -s/p bilateral mastectomies  10. S/p right knee replacement  -Done in 08/2018 -He has healed well and still undergoing PT. He plans to do neuro PT next month -He can ambulate moderately with walker. He is still uses wheelchair as needed.  -He still feels his right leg is better than his left.   Plan  -Flu shot today  -I refilled Xarelto today  -Hct 46.1% today, no phlebotomy  -Lab and phlebotomy if hematocrit more than 47% in 3 and 6 month  -f/u in 6 months  No problem-specific Assessment & Plan notes found for this encounter.   No orders of the defined types were placed in this encounter.  All questions were answered. The patient knows to call the clinic with any problems, questions or concerns. No barriers to learning was detected. I spent 15 minutes counseling the patient face to face. The total time spent in the appointment was 20 minutes and more than 50% was on counseling and review of test results     [name], MD 11/24/2018   I, Joslyn Devon, am acting as scribe for [name], MD.   I have reviewed the above documentation for accuracy and completeness, and I agree with the above.

## 2018-11-23 ENCOUNTER — Ambulatory Visit (INDEPENDENT_AMBULATORY_CARE_PROVIDER_SITE_OTHER): Payer: Medicare Other | Admitting: Psychology

## 2018-11-23 ENCOUNTER — Encounter: Payer: Self-pay | Admitting: Hematology

## 2018-11-23 DIAGNOSIS — F4312 Post-traumatic stress disorder, chronic: Secondary | ICD-10-CM | POA: Diagnosis not present

## 2018-11-24 ENCOUNTER — Encounter: Payer: Self-pay | Admitting: Hematology

## 2018-11-24 ENCOUNTER — Other Ambulatory Visit: Payer: Self-pay

## 2018-11-24 ENCOUNTER — Telehealth: Payer: Self-pay | Admitting: Hematology

## 2018-11-24 ENCOUNTER — Inpatient Hospital Stay (HOSPITAL_BASED_OUTPATIENT_CLINIC_OR_DEPARTMENT_OTHER): Payer: Medicare Other | Admitting: Hematology

## 2018-11-24 ENCOUNTER — Inpatient Hospital Stay: Payer: Medicare Other | Attending: Hematology

## 2018-11-24 VITALS — BP 122/91 | HR 115 | Temp 98.3°F | Resp 17 | Ht 69.0 in | Wt 204.8 lb

## 2018-11-24 DIAGNOSIS — Z79899 Other long term (current) drug therapy: Secondary | ICD-10-CM | POA: Diagnosis not present

## 2018-11-24 DIAGNOSIS — G4733 Obstructive sleep apnea (adult) (pediatric): Secondary | ICD-10-CM | POA: Insufficient documentation

## 2018-11-24 DIAGNOSIS — F640 Transsexualism: Secondary | ICD-10-CM | POA: Diagnosis not present

## 2018-11-24 DIAGNOSIS — D751 Secondary polycythemia: Secondary | ICD-10-CM | POA: Insufficient documentation

## 2018-11-24 DIAGNOSIS — Z7901 Long term (current) use of anticoagulants: Secondary | ICD-10-CM | POA: Diagnosis not present

## 2018-11-24 DIAGNOSIS — Z96651 Presence of right artificial knee joint: Secondary | ICD-10-CM | POA: Diagnosis not present

## 2018-11-24 DIAGNOSIS — D45 Polycythemia vera: Secondary | ICD-10-CM

## 2018-11-24 DIAGNOSIS — F419 Anxiety disorder, unspecified: Secondary | ICD-10-CM | POA: Diagnosis not present

## 2018-11-24 DIAGNOSIS — Z8673 Personal history of transient ischemic attack (TIA), and cerebral infarction without residual deficits: Secondary | ICD-10-CM | POA: Insufficient documentation

## 2018-11-24 DIAGNOSIS — Z789 Other specified health status: Secondary | ICD-10-CM

## 2018-11-24 DIAGNOSIS — Z86718 Personal history of other venous thrombosis and embolism: Secondary | ICD-10-CM | POA: Insufficient documentation

## 2018-11-24 DIAGNOSIS — Z23 Encounter for immunization: Secondary | ICD-10-CM | POA: Insufficient documentation

## 2018-11-24 DIAGNOSIS — D500 Iron deficiency anemia secondary to blood loss (chronic): Secondary | ICD-10-CM

## 2018-11-24 DIAGNOSIS — Z791 Long term (current) use of non-steroidal anti-inflammatories (NSAID): Secondary | ICD-10-CM | POA: Insufficient documentation

## 2018-11-24 LAB — CBC WITH DIFFERENTIAL/PLATELET
Abs Immature Granulocytes: 0.01 10*3/uL (ref 0.00–0.07)
Basophils Absolute: 0 10*3/uL (ref 0.0–0.1)
Basophils Relative: 0 %
Eosinophils Absolute: 0 10*3/uL (ref 0.0–0.5)
Eosinophils Relative: 0 %
HCT: 46.1 % (ref 39.0–52.0)
Hemoglobin: 13.9 g/dL (ref 13.0–17.0)
Immature Granulocytes: 0 %
Lymphocytes Relative: 25 %
Lymphs Abs: 1.3 10*3/uL (ref 0.7–4.0)
MCH: 23.1 pg — ABNORMAL LOW (ref 26.0–34.0)
MCHC: 30.2 g/dL (ref 30.0–36.0)
MCV: 76.6 fL — ABNORMAL LOW (ref 80.0–100.0)
Monocytes Absolute: 0.4 10*3/uL (ref 0.1–1.0)
Monocytes Relative: 8 %
Neutro Abs: 3.6 10*3/uL (ref 1.7–7.7)
Neutrophils Relative %: 67 %
Platelets: 233 10*3/uL (ref 150–400)
RBC: 6.02 MIL/uL — ABNORMAL HIGH (ref 4.22–5.81)
RDW: 14.9 % (ref 11.5–15.5)
WBC: 5.3 10*3/uL (ref 4.0–10.5)
nRBC: 0 % (ref 0.0–0.2)

## 2018-11-24 MED ORDER — RIVAROXABAN 20 MG PO TABS: 20.0000 mg | ORAL_TABLET | Freq: Every day | ORAL | 3 refills | Status: DC

## 2018-11-24 MED ORDER — INFLUENZA VAC SPLIT QUAD 0.5 ML IM SUSY
0.5000 mL | PREFILLED_SYRINGE | Freq: Once | INTRAMUSCULAR | Status: AC
  Administered 2018-11-24: 0.5 mL via INTRAMUSCULAR

## 2018-11-24 MED ORDER — INFLUENZA VAC SPLIT QUAD 0.5 ML IM SUSY
PREFILLED_SYRINGE | INTRAMUSCULAR | Status: AC
  Filled 2018-11-24: qty 0.5

## 2018-11-24 NOTE — Progress Notes (Signed)
05/11/2018 2:13 PM   Jesse Bowers [birth year] [id number]  Referring provider: Concepcion Elk, MD [address] [place],  [place] [address]  Chief Complaint  Patient presents with  . Urinary Frequency    HPI: Jesse Bowers is a 53 y.o. transgender male (male to male) with a history of left hydronephrosis, frequency and incontinence who presents today for follow up for frequency and incontinence.  Background history Patient is a 53 year old transgender male (male to male) with a history of mild left hydronephrosis who is referred by Dr. [name] for hesitancy, frequency and incontinence.  Contrast CT in 2017 noted mild chronic hydronephrosis.  He is having flank pain at times.   Today, he complains of frequency x 10-15, strong urgency (he had the urge to void three times during the visit), nocturia x 1-3 (sometimes take lasix in the evening and has sleep apnea and sleeps with BiPAP), incontinence and hesitancy.  These symptoms started 6 months ago.  Patient denies any gross hematuria, dysuria or suprapubic/flank pain.  Patient denies any fevers, chills, nausea or vomiting.  His UA is negative.  His PVR is 34 mL.  He is suffering from constipation.   He has a small can of diet Coke daily, otherwise mostly water.    At his visit on 12/07/2017, he was started on Myrbetriq 25 mg daily.  On his 12/29/2017 visit he stated that since taking the Myrbetriq he has had episodes of and frequent urination.  He stated that there are occasions where the stream was slow and he had difficulty starting the urinary stream.  He stated the Myrbetriq is helpful as it is decreased his urinary frequency and nocturia.  His I PSS score 22/5-6, PVR was 107 mL.  He was still experiencing left-sided flank pain.  RUS on 12/21/2017 revealed slightly more fullness of the left pelvocaliceal system than was noted on the ultrasound of 11/27/2016 possibly due to the history of chronic UPJ narrowing.  No renal calculi are seen.  There is mild cortical thinning bilaterally.  A CT Hematuria workup on 01/13/2018 returned the impression by Dr. Candise Che of: 1. Small lower pole left renal calculi but no obstructing ureteral calculi or bladder calculi.  2. 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  3. No worrisome renal or bladder lesions.  4. No acute abdominal/pelvic findings, mass lesions or adenopathy.  5. Status post cholecystectomy. Mild stable associated common bile duct dilatation.  Renal lasix scan on 01/26/2018 noted normally functioning right kidney.  There is mild decreased perfusion and cortical uptake by the left kidney. Decreased and delayed clearance of the radiopharmaceutical compatible with partial obstruction.  Split renal function is equal to 44.5% from the left kidney and 55.5% from the right kidney.  On his visit  (05/11/2018), he reports his symptoms have overall improved but "When I gotta go, I go."  He says he's still mixed because he is working with the pelvic PT and he may have scar tissue in his urethra from previous catheterizations.  He reports that his sensation of if he needs to urinate goes from 0-100 with no between, and it happens almost every time, unless he's told to go and in the morning.  He says he does not get the 'warning shot' but he is not going 12-15 times a day and does not have nocturia unless he takes his medications late, as he does have to take his medications with water, but he does not always need to even  then.  He also cannot tell if he's completely done.  Patient is not sure if he wishes to increase his Myrbetriq dosage, given that he is about to undergo surgery soon, but was agreeable to raising his dosage.  BPH WITH LUTS  (prostate and/or bladder) IPSS score: 6/6     PVR: 0 mL    Previous score: 16/3   Previous PVR: 16 mL  Major complaint(s):  He had his Foley catheter removed with 2 cc still in the balloon three months ago after his knee surgery.  He states he  cannot feel the sensation to use the restroom.  He then gets to the point where he is so full and then he feels like he could burst.  He cannot make it to the restroom on time.  He is having 85% to 90% failure to hold his urine.  He had noticed some improvement with urinary frequency on the Myrbetriq 50 mg.    He has also had some instances of gross hematuria over the last three months.  CTU in 01/2018 revealed a small lower pole left renal calculi but no obstructing ureteral calculi or bladder calculi.  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  No worrisome renal or bladder lesions.  No acute abdominal/pelvic findings, mass lesions or adenopathy.  Status post cholecystectomy. Mild stable associated common bile duct dilatation.  Denies any dysuria or suprapubic pain.   Denies any recent fevers, chills, nausea or vomiting.  IPSS    Row Name 11/25/18 1500         International Prostate Symptom Score   How often have you had the sensation of not emptying your bladder?  About half the time     How often have you had to urinate less than every two hours?  About half the time     How often have you found you stopped and started again several times when you urinated?  About half the time     How often have you found it difficult to postpone urination?  Almost always     How often have you had a weak urinary stream?  Not at All     How often have you had to strain to start urination?  Not at All     How many times did you typically get up at night to urinate?  2 Times     Total IPSS Score  16       Quality of Life due to urinary symptoms   If you were to spend the rest of your life with your urinary condition just the way it is now how would you feel about that?  Terrible        Score:  1-7 Mild 8-19 Moderate 20-35 Severe    PMH: Past Medical History:  Diagnosis Date  . Abnormal weight loss   . Anxiety   . Arthritis   . Celiac disease   . Cervical neck pain with  evidence of disc disease    patient has a cyst   . Chronic constipation   . Chronic diastolic heart failure ([place])    Pt. denies  . Chronic pain   . Degenerative disc disease at L5-S1 level    with stenosis  . DVT (deep venous thrombosis) ([facility])    Right upper arm, bilateral leg  . Eczema    inguinal, feet  . Elevated liver enzymes   . 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 Family history of adverse  reaction to anesthesia    family has problems with anesthesia of nausea and vomiting   . 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 GERD (gastroesophageal reflux disease)    History of in 20's  . Gluten enteropathy   . H/O parotitis    right   . Hard of hearing   . History of kidney stones   . History of retinal tear    Bilateral  . History of staph infection    required wound vac  . Hx-TIA (transient ischemic attack)    2015  . LVH (left ventricular hypertrophy) 12/15/2016   Mild, noted on ECHO  . MVP (mitral valve prolapse)   . NAFL (nonalcoholic fatty liver)   . Neck pain   . Neuromuscular disorder ([place])    bilateral neuropathy feet.  . Pneumonia 12/17/2010  . Polycythemia   . Polycythemia, secondary   . PONV (postoperative nausea and vomiting)   . Protein C deficiency ([place])    Dr. Anne Fu  . Psoriasis    16 X10 cm psoriatic rash on sole of left foot ; open and occ scant bleeding;   . psoriatic arthritis   . PTSD (post-traumatic stress disorder)   . Scaphoid fracture of wrist 09/23/2013  . Seizure ([place])    childhood, medication until age 65 then weanned completely off  . Sleep apnea    split night study last done by Dr. [name] 06/18/15 shows severe OSA, CSA, and hypersomnia, rec bipap  . Splenomegaly   . Stenosis of ureteropelvic junction (UPJ)    left  . 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 [name] follows  . Syrinx of spinal cord ([place]) 01/06/2014   c spine on MRI  . Tachycardia    hx of   . Transfusion  history    past history- none recent, after surgeries due to blood loss  . Wears glasses   . Wears hearing aid     Surgical History: Past Surgical History:  Procedure Laterality Date  . ABDOMINAL HYSTERECTOMY Bilateral 1994   TAH, BSO- tranverse incision at 53 yo  . ANKLE ARTHROSCOPY WITH RECONSTRUCTION Right 2007  . CHOLECYSTECTOMY     laparoscopic  . COLONOSCOPY     x3  . EYE SURGERY     Left eye 03/02/2018, right 02/15/2018  . HIP ARTHROSCOPY W/ LABRAL REPAIR Right 05/11/2013   acetabular labral tear 03/30/2013  . KNEE ARTHROPLASTY Right   . KNEE JOINT MANIPULATION Left    x3 under anesthesia  . KNEE SURGERY Bilateral 1984   Right ACL, left PCL repair  . LITHOTRIPSY  2005  . LIVER BIOPSY  2013   normal results.  [name] MASTECTOMY Bilateral    prior to 2009  . MOUTH SURGERY    . NASAL SEPTUM SURGERY N/A 09/20/2015   by ENT Dr. [name]  . OVARIAN CYST SURGERY Left    size of grapefruit, was informed that she had shortened vagina  . SHOULDER SURGERY Bilateral    Right 08/15/2016, Left 11/15/2016  . THUMB ARTHROSCOPY Left   . THYROIDECTOMY, PARTIAL Left 2008  . TOTAL KNEE ARTHROPLASTY Right 08/23/2018   Procedure: TOTAL KNEE ARTHROPLASTY;  Surgeon: [name], MD;  Location: [facility];  Service: Orthopedics;  Laterality: Right;  50mn  . TOTAL KNEE REVISION Left 02/06/2016   Procedure: LEFT TOTAL KNEE REVISION;  Surgeon: FGaynelle Arabian MD;  Location: [facility];  Service: Orthopedics;  Laterality: Left;  . TOTAL KNEE REVISION  Left 04/22/2017   Procedure: Left knee polyethylene revision;  Surgeon: [name], MD;  Location: [facility];  Service: Orthopedics;  Laterality: Left;  . UPPER GI ENDOSCOPY  2003    Home Medications:  Allergies as of 11/25/2018      Reactions   Gabapentin Nausea Only, Other (See Comments)   Other reaction(s): nausea, mental status Drowsiness and restlessness. Pt. States, " It makes me crazy, I can't take this medicine."   Penicillin G Anaphylaxis, Other  (See Comments)   Has patient had a PCN reaction causing immediate rash, facial/tongue/throat swelling, SOB or lightheadedness with hypotension: Yes Has patient had a PCN reaction causing severe rash involving mucus membranes or skin necrosis: No Has patient had a PCN reaction that required hospitalization Yes Has patient had a PCN reaction occurring within the last 10 years: No If all of the above answers are "NO", then may proceed with Cephalosporin use.   Sulfa Antibiotics Rash   Stevens-Johnson rash   Tegaderm Chg Dressing  [chlorhexidine] Swelling, Itching   Vancomycin Rash, Other (See Comments)   RED MAN SYNDROME CAN HAVE IF GIVEN OVER 2HOURS   Duloxetine Other (See Comments)   Restless legs   Tegaderm Ag Mesh [silver] Dermatitis   Causes blistering wounds   Citalopram Other (See Comments)   Dystonia   Wheat Bran    Due to Celiac   Ibuprofen Other (See Comments)   Contraindicated with Xarelto.    Nortriptyline Other (See Comments)   Dry mouth at 25 mg dose.  Tolerates 10 mg dose   Pregabalin Other (See Comments)   Ineffective   Sulfacetamide Sodium-sulfur Rash      Medication List       Accurate as of November 25, 2018 11:59 PM. If you have any questions, ask your nurse or doctor.        STOP taking these medications   B-D 3CC LUER-LOK SYR 18GX1-1/2 18G X 1-1/2" 3 ML Misc Generic drug: SYRINGE-NEEDLE (DISP) 3 ML Stopped by: Anayla Giannetti, PA-C     TAKE these medications   acyclovir ointment 5 % Commonly known as: ZOVIRAX Apply 1 application topically daily as needed (cold sores).   antiseptic oral rinse Liqd 15 mLs by Mouth Rinse route as needed for dry mouth.   augmented betamethasone dipropionate 0.05 % ointment Commonly known as: DIPROLENE-AF Apply 1 application topically 2 (two) times daily.   baclofen 20 MG tablet Commonly known as: LIORESAL Take 1 tablet (20 mg total) by mouth 4 (four) times daily.   CeraVe Crea Apply 1 application topically  2 (two) times a day.   clobetasol 0.05 % Gel Commonly known as: TEMOVATE Apply 1 application topically 2 (two) times daily as needed (itching).   clorazepate 7.5 MG tablet Commonly known as: TRANXENE Take 7.5 mg by mouth 2 (two) times daily.   clorazepate 15 MG tablet Commonly known as: TRANXENE Take 15 mg by mouth at bedtime.   Cosentyx Sensoready Pen 150 MG/ML [place] Generic drug: Secukinumab Inject 300 mg into the skin every 28 (twenty-eight) days.   desonide 0.05 % ointment Commonly known as: DESOWEN Apply 1 application topically 2 (two) times daily as needed (psoriasis).   diclofenac sodium 1 % Gel Commonly known as: VOLTAREN APPLY TOPICALLY TO BOTH HANDS 3 TIMES DAILY.   Enstilar 0.005-0.064 % Foam Generic drug: Calcipotriene-Betameth Diprop Apply 1 application topically 2 (two) times daily.   Fluocinolone Acetonide 0.01 % Oil Place 3 drops into both ears 2 (two) times daily as needed  for itching.   furosemide 40 MG tablet Commonly known as: LASIX Take 1.5 tablets (60 mg total) by mouth daily.   mirabegron ER 25 MG Tb24 tablet Commonly known as: MYRBETRIQ Take 1 tablet (25 mg total) by mouth daily.   morphine 60 MG 24 hr capsule Commonly known as: Kadian Take 1 capsule (60 mg total) by mouth every 12 (twelve) hours.   NEEDLE (DISP) 18 G 18G X 1-1/2" Misc 1 Units by Does not apply route once a week.   NEEDLE (DISP) 23 G 23G X 3/4" Misc 1 Units by Does not apply route once a week. What changed: Another medication with the same name was removed. Continue taking this medication, and follow the directions you see here. Changed by: Zara Council, PA-C   neomycin-polymyxin-hydrocortisone 3.5-10000-1 OTIC suspension Commonly known as: CORTISPORIN Place 4 drops into both ears 2 (two) times daily as needed (ear pain).   Oxycodone HCl 10 MG Tabs Take 1 tablet (10 mg total) by mouth every 6 (six) hours as needed.   potassium chloride 10 MEQ tablet Commonly known  as: KLOR-CON TAKE K-DUR 2 TABLETS 10 MEQ DAILY What changed:   how much to take  how to take this  when to take this  additional instructions   prazosin 2 MG capsule Commonly known as: MINIPRESS Take 4 mg by mouth at bedtime.   rivaroxaban 20 MG Tabs tablet Commonly known as: Xarelto Take 1 tablet (20 mg total) by mouth at bedtime. TAKE 1 TABLET DAILY BEFORE BEDTIME   Syringe (Disposable) 1 ML Misc 1 Units by Does not apply route once a week.   Systane Complete 0.6 % Soln Generic drug: Propylene Glycol Place 1-2 drops into both eyes 4 (four) times daily as needed (dry eyes).   testosterone cypionate 200 MG/ML injection Commonly known as: DEPOTESTOSTERONE CYPIONATE INJECT 0.5 MLS (100 MG TOTAL) INTO THE MUSCLE ONCE A WEEK.   ursodiol 500 MG tablet Commonly known as: ACTIGALL Take 500 mg by mouth 2 (two) times daily with a meal.   ursodiol 250 MG tablet Commonly known as: ACTIGALL Take 250 mg by mouth 2 (two) times daily with a meal.   venlafaxine XR 150 MG 24 hr capsule Commonly known as: EFFEXOR-XR Take 2 capsules (300 mg total) by mouth daily with breakfast.       Allergies:  Allergies  Allergen Reactions  . Gabapentin Nausea Only and Other (See Comments)    Other reaction(s): nausea, mental status Drowsiness and restlessness. Pt. States, " It makes me crazy, I can't take this medicine."  . Penicillin G Anaphylaxis and Other (See Comments)    Has patient had a PCN reaction causing immediate rash, facial/tongue/throat swelling, SOB or lightheadedness with hypotension: Yes Has patient had a PCN reaction causing severe rash involving mucus membranes or skin necrosis: No Has patient had a PCN reaction that required hospitalization Yes Has patient had a PCN reaction occurring within the last 10 years: No If all of the above answers are "NO", then may proceed with Cephalosporin use.   . Sulfa Antibiotics Rash    Stevens-Johnson rash  . Tegaderm Chg Dressing   [Chlorhexidine] Swelling and Itching  . Vancomycin Rash and Other (See Comments)    RED MAN SYNDROME CAN HAVE IF GIVEN OVER 2HOURS  . Duloxetine Other (See Comments)    Restless legs  . Tegaderm Ag Mesh [Silver] Dermatitis    Causes blistering wounds   . Citalopram Other (See Comments)    Dystonia  .  Wheat Bran     Due to Celiac  . Ibuprofen Other (See Comments)    Contraindicated with Xarelto.   . Nortriptyline Other (See Comments)    Dry mouth at 25 mg dose.  Tolerates 10 mg dose  . Pregabalin Other (See Comments)    Ineffective  . Sulfacetamide Sodium-Sulfur Rash    Family History: Family History  Problem Relation Age of Onset  . Stroke Maternal Grandfather        52  . Heart attack Maternal Grandfather   . Hypertension Mother   . Psoriasis Mother   . Other Mother        meningioma developed ~2019  . Cancer Paternal Grandfather   . Heart attack Paternal Grandfather   . Stroke Paternal Uncle        age 25  . Polycythemia Paternal Uncle   . Stroke Maternal Grandmother   . Congestive Heart Failure Maternal Grandmother   . Heart attack Maternal Grandmother   . Protein C deficiency Sister 51       Miscarriages    Social History:  reports that he has never smoked. He has never used smokeless tobacco. He reports current alcohol use. He reports that he does not use drugs.  ROS:  UROLOGY Frequent Urination?: Yes Hard to postpone urination?: Yes Burning/pain with urination?: No Get up at night to urinate?: Yes Leakage of urine?: Yes Urine stream starts and stops?: No Trouble starting stream?: No Do you have to strain to urinate?: No Blood in urine?: No Urinary tract infection?: No Sexually transmitted disease?: No Injury to kidneys or bladder?: No Painful intercourse?: No Weak stream?: No Erection problems?: No Penile pain?: No Currently pregnant?: No Vaginal bleeding?: No Last menstrual period?: n  Gastrointestinal Nausea?: Yes Vomiting?:  No Indigestion/heartburn?: No Diarrhea?: No Constipation?: Yes  Constitutional Fever: No Night sweats?: No Weight loss?: Yes Fatigue?: No  Skin Skin rash/lesions?: Yes Itching?: Yes  Eyes Blurred vision?: No Double vision?: No  Ears/Nose/Throat Sore throat?: No Sinus problems?: No  Hematologic/Lymphatic Swollen glands?: No Easy bruising?: No  Cardiovascular Leg swelling?: Yes Chest pain?: No  Respiratory Cough?: No Shortness of breath?: No  Endocrine Excessive thirst?: No  Musculoskeletal Back pain?: No Joint pain?: Yes  Neurological Headaches?: No Dizziness?: No  Psychologic Depression?: No Anxiety?: Yes  Physical Exam: BP 123/87   Pulse 97   Ht 5' 9"  (1.753 m)   BMI 30.24 kg/m   Constitutional:  Well nourished. Alert and oriented, No acute distress. HEENT: [place] AT, moist mucus membranes.  Trachea midline, no masses. Cardiovascular: No clubbing, cyanosis, or edema. Respiratory: Normal respiratory effort, no increased work of breathing. Neurologic: Grossly intact, no focal deficits, moving all 4 extremities. Psychiatric: Normal mood and affect.  Laboratory Data: Lab Results  Component Value Date   WBC 5.3 11/24/2018   HGB 13.9 11/24/2018   HCT 46.1 11/24/2018   MCV 76.6 (L) 11/24/2018   PLT 233 11/24/2018    Lab Results  Component Value Date   CREATININE 0.84 08/25/2018   Lab Results  Component Value Date   TESTOSTERONE 611 06/10/2018   Lab Results  Component Value Date   TSH 1.370 04/29/2018      Component Value Date/Time   CHOL 142 01/05/2018 1509   HDL 52 01/05/2018 1509   CHOLHDL 2.7 01/05/2018 1509   LDLCALC 81 01/05/2018 1509   Lab Results  Component Value Date   AST 28 08/18/2018   Lab Results  Component Value Date   ALT  22 08/18/2018   I have reviewed the labs.  Pertinent Imaging: Results for orders placed or performed in visit on 11/25/18  Bladder Scan (Post Void Residual) in office  Result Value Ref Range    Scan Result 0    Assessment & Plan:    1. Gross hematuria CTU completed in 01/2018 revealed chronic left UPJ obstruction I explained to the patient that there are a number of causes that can be associated with blood in the urine, such as stones, UTI's, damage to the urinary tract and/or cancer. I've recommended a cystoscopy. I described how this is performed, typically in an office setting with a flexible cystoscope. We described the risks, benefits, and possible side effects, the most common of which is a minor amount of blood in the urine and/or burning which usually resolves in 24 to 48 hours.   The patient had the opportunity to ask questions which were answered. Based upon this discussion, the patient is willing to proceed. Therefore, I've ordered the  cystoscopy RTC for cystoscopy for further evaluation of gross hematuria   2. Left hydronephrosis Due to chronic UPJ obstruction - continue to monitor closely Most recent creatinine 1.01 - which is baseline Will consider repeating renal lasix scan soon once the COVID-19 precautions have lifted as he would be at high risk with the virus - last scan was in 01/2018  3. Urgency/urge incontinence If cystoscopy does not identify an etiology for the incontinence, will need to consider UDS Samples given for Myrbetriq 50 mg daily as they are somewhat helpful with his urinary symptoms    Return for cystoscopy for gross hematuria .  Zara Council, PA-C  [facility] [address]  [place], [place] [address] [phone]

## 2018-11-24 NOTE — Telephone Encounter (Signed)
Scheduled appt per 9/23 los.  Printed avs per patient request.

## 2018-11-25 ENCOUNTER — Encounter: Payer: Self-pay | Admitting: Urology

## 2018-11-25 ENCOUNTER — Ambulatory Visit (INDEPENDENT_AMBULATORY_CARE_PROVIDER_SITE_OTHER): Payer: Medicare Other | Admitting: Psychology

## 2018-11-25 ENCOUNTER — Ambulatory Visit (INDEPENDENT_AMBULATORY_CARE_PROVIDER_SITE_OTHER): Payer: Medicare Other | Admitting: Urology

## 2018-11-25 VITALS — BP 123/87 | HR 97 | Ht 69.0 in

## 2018-11-25 DIAGNOSIS — F4312 Post-traumatic stress disorder, chronic: Secondary | ICD-10-CM

## 2018-11-25 DIAGNOSIS — N3941 Urge incontinence: Secondary | ICD-10-CM | POA: Diagnosis not present

## 2018-11-25 DIAGNOSIS — F4323 Adjustment disorder with mixed anxiety and depressed mood: Secondary | ICD-10-CM | POA: Diagnosis not present

## 2018-11-25 DIAGNOSIS — R310 Gross hematuria: Secondary | ICD-10-CM | POA: Diagnosis not present

## 2018-11-25 DIAGNOSIS — N135 Crossing vessel and stricture of ureter without hydronephrosis: Secondary | ICD-10-CM | POA: Diagnosis not present

## 2018-11-25 LAB — BLADDER SCAN AMB NON-IMAGING: Scan Result: 0

## 2018-11-30 ENCOUNTER — Ambulatory Visit (INDEPENDENT_AMBULATORY_CARE_PROVIDER_SITE_OTHER): Payer: Medicare Other | Admitting: Psychology

## 2018-11-30 DIAGNOSIS — F4312 Post-traumatic stress disorder, chronic: Secondary | ICD-10-CM

## 2018-12-02 ENCOUNTER — Ambulatory Visit (INDEPENDENT_AMBULATORY_CARE_PROVIDER_SITE_OTHER): Payer: Medicare Other | Admitting: Psychology

## 2018-12-02 DIAGNOSIS — F4312 Post-traumatic stress disorder, chronic: Secondary | ICD-10-CM

## 2018-12-03 ENCOUNTER — Encounter: Payer: Self-pay | Admitting: Hematology

## 2018-12-07 ENCOUNTER — Ambulatory Visit (INDEPENDENT_AMBULATORY_CARE_PROVIDER_SITE_OTHER): Payer: Medicare Other | Admitting: Psychology

## 2018-12-07 DIAGNOSIS — F4312 Post-traumatic stress disorder, chronic: Secondary | ICD-10-CM

## 2018-12-08 ENCOUNTER — Encounter: Payer: Self-pay | Admitting: Urology

## 2018-12-08 ENCOUNTER — Other Ambulatory Visit: Payer: Self-pay | Admitting: Urology

## 2018-12-08 ENCOUNTER — Other Ambulatory Visit: Payer: Self-pay

## 2018-12-08 ENCOUNTER — Ambulatory Visit (INDEPENDENT_AMBULATORY_CARE_PROVIDER_SITE_OTHER): Payer: Medicare Other | Admitting: Urology

## 2018-12-08 ENCOUNTER — Telehealth: Payer: Self-pay | Admitting: Urology

## 2018-12-08 VITALS — BP 116/54 | HR 118 | Ht 69.0 in | Wt 204.0 lb

## 2018-12-08 DIAGNOSIS — R310 Gross hematuria: Secondary | ICD-10-CM

## 2018-12-08 DIAGNOSIS — N3281 Overactive bladder: Secondary | ICD-10-CM

## 2018-12-08 DIAGNOSIS — Q6239 Other obstructive defects of renal pelvis and ureter: Secondary | ICD-10-CM

## 2018-12-08 DIAGNOSIS — M6289 Other specified disorders of muscle: Secondary | ICD-10-CM

## 2018-12-08 LAB — MICROSCOPIC EXAMINATION
Bacteria, UA: NONE SEEN
RBC, Urine: NONE SEEN /hpf (ref 0–2)

## 2018-12-08 LAB — URINALYSIS, COMPLETE
Bilirubin, UA: NEGATIVE
Glucose, UA: NEGATIVE
Ketones, UA: NEGATIVE
Nitrite, UA: NEGATIVE
Protein,UA: NEGATIVE
RBC, UA: NEGATIVE
Specific Gravity, UA: 1.02 (ref 1.005–1.030)
Urobilinogen, Ur: 0.2 mg/dL (ref 0.2–1.0)
pH, UA: 7 (ref 5.0–7.5)

## 2018-12-08 MED ORDER — MIRABEGRON ER 50 MG PO TB24: 50.0000 mg | ORAL_TABLET | Freq: Every day | ORAL | 11 refills | Status: DC

## 2018-12-08 NOTE — Progress Notes (Signed)
   12/08/18  CC:  Chief Complaint  Patient presents with  . Cysto    HPI: 53 year old male (genetic male) who presents today for further evaluation for severe refractory bladder symptoms, episodes of gross hematuria for cystoscopy for anatomic evaluation of the bladder.  Blood pressure (!) 116/54, pulse (!) 118, height 5' 9"  (1.753 m), weight 204 lb (92.5 kg). NED. A&Ox3.   No respiratory distress   Abd soft, NT, ND Labial excoriation appreciated, patient reports secondary to psoriasis.  Normal urethral meatus in orthotopic position.  No obvious pelvic organ prolapse.  Cystoscopy Procedure Note  Patient identification was confirmed, informed consent was obtained, and patient was prepped using Betadine solution.  Lidocaine jelly was administered per urethral meatus.    Procedure: - Flexible cystoscope introduced, without any difficulty.   - Thorough search of the bladder revealed:    normal urethral meatus    normal urothelium with small amount of squamous metaplasia just at bladder neck    no stones    no ulcers     no tumors    no urethral polyps    no trabeculation    Colonic impression noted posteriorly  - Ureteral orifices were normal in position and appearance.  Post-Procedure: - Patient tolerated the procedure well  Assessment/ Plan:  1. Gross hematuria Bladder unremarkable today other than a small amount of squamous metaplasia at the bladder neck No tumors lesions or ulcerations appreciated Previous upper tract imaging unremarkable Based on labial excoriation/irritation, I am suspicious that gross hematuria episodes may be related to dermatological issue rather than bladder upper tract pathology - Urinalysis, Complete  2. Pelvic floor dysfunction Continue to encourage to work with pelvic PT  3. OAB (overactive bladder) Continue Myrbetriq  4. UPJ obstruction, congenital Chronic left UPJ obstruction, given normal renal function and relatively  asymptomatic, we have agreed to follow conservatively Intermittent imaging  Tereso indicates today that he is interested in moving forward with the transition process in the near future.  He would like to get plugged in at [facility] to the multidisciplinary clinic.  Will reach out to Dr. Jairo Ben and his team at [facility] to see how can facilitate this referral.  If he does end up seeing his care there, he can continue to be followed for his other urologic issues including UPJ obstruction and voiding dysfunction at [facility].  We will hold off on any further follow-up at this time until we investigate the above more thoroughly  Hollice Espy, MD

## 2018-12-08 NOTE — Telephone Encounter (Signed)
This patient was looking to be referred to the transgender program at [facility].  We reach out to Dr. Francesca Jewett who was involved in this at [facility] who gave [place] contact information on how to refer.    Referral can be either emailed or called in.  [phone]  [email or web address]  Please make referral

## 2018-12-09 ENCOUNTER — Ambulatory Visit (INDEPENDENT_AMBULATORY_CARE_PROVIDER_SITE_OTHER): Payer: Medicare Other | Admitting: Psychology

## 2018-12-09 DIAGNOSIS — F4312 Post-traumatic stress disorder, chronic: Secondary | ICD-10-CM | POA: Diagnosis not present

## 2018-12-09 NOTE — Telephone Encounter (Signed)
I called and left a message for the clinic to call me back  [phone]    [facility]

## 2018-12-10 NOTE — Telephone Encounter (Signed)
Spoke with the clinic today and got the fax number, was told that they would reach out to the patient within 1-2 weeks to schedule a telemedicine app. I faxed the referral to [phone] I did call the patient and advised him of the status and asked him to call me directly if he has not heard anything from the clinic in 2 weeks. I gave him my direct #.    Sharyn Lull

## 2018-12-14 ENCOUNTER — Ambulatory Visit (INDEPENDENT_AMBULATORY_CARE_PROVIDER_SITE_OTHER): Payer: Medicare Other | Admitting: Psychology

## 2018-12-14 DIAGNOSIS — F4312 Post-traumatic stress disorder, chronic: Secondary | ICD-10-CM | POA: Diagnosis not present

## 2018-12-15 ENCOUNTER — Encounter (INDEPENDENT_AMBULATORY_CARE_PROVIDER_SITE_OTHER): Payer: Self-pay | Admitting: Ophthalmology

## 2018-12-15 ENCOUNTER — Ambulatory Visit (INDEPENDENT_AMBULATORY_CARE_PROVIDER_SITE_OTHER): Payer: BC Managed Care – PPO | Admitting: Ophthalmology

## 2018-12-15 DIAGNOSIS — H3581 Retinal edema: Secondary | ICD-10-CM

## 2018-12-15 DIAGNOSIS — H543 Unqualified visual loss, both eyes: Secondary | ICD-10-CM | POA: Diagnosis not present

## 2018-12-15 DIAGNOSIS — H35413 Lattice degeneration of retina, bilateral: Secondary | ICD-10-CM

## 2018-12-15 NOTE — Progress Notes (Signed)
[facility] Note  12/15/2018     CHIEF COMPLAINT Patient presents for Retina Evaluation   HISTORY OF PRESENT ILLNESS: Jesse Bowers is a 53 y.o. adult who presents to the clinic today for:   HPI    Retina Evaluation    In both eyes.  This started 6 days ago.  Duration of 6 days.  Associated Symptoms Floaters.  Context:  distance vision and near vision.  Treatments tried include artificial tears.  Response to treatment was no improvement.  I, the attending physician,  performed the HPI with the patient and updated documentation appropriately.          Comments    Dr. Zadie Rhine pt.  Called Dr Rankin's office this past Friday (10.9.20) to schedule an appt., but was informed that Dr. Zadie Rhine was on vacation for a week, so pt was referred here.  6 days ago, on 10.9.20, pt had sudden onset blurred VA OU and new floaters OU.  Denies pain, flashes.  Pt has been having more frequent headaches though.  Pt had similar symptoms following a stroke in May of 2015, and has periodic bouts of similar symptoms, but typically they resolve on their own fairly quickly.  Pt has "small vessel ischemic disease," which can sometimes affect his speech, motor function and thought patterns.  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  Pt has early cataracts and dry eyes.  Pt also had a short bout of diplopia early this a.m., but it resolved on its own.  Uses Systane prn OU.       Last edited by Bernarda Caffey, MD on 12/15/2018 11:10 PM. (History)    pt is a regular pt of Dr. Zadie Rhine, pt states 6 days ago his vision suddenly became, he states it has progressively gotten worse since then, he states when he tries to read a magazine or text, he is having to bring the magazine or phone closer to his face to see it, pt has small vessel ischemic disease, mostly in his brain, he states 1-2 days before this happened he noticed some speech changes and loss  of words, which he is still experiencing, he states he had a headache Friday-Sunday, pt had a stroke in 2015, pt is in a wheelchair due to balance and weakness issues, which is caused by arthritis and knee replacements in both knees, pt does see a neurologist, pt has had laser retinopexy OU in December 2019 by Dr. Zadie Rhine  Referring physician: Hurman Horn, MD [place],  [place] [id number]  HISTORICAL INFORMATION:   Selected notes from the MEDICAL RECORD NUMBER Dr. Zadie Rhine pt, vision changes OU LEE:  Ocular Hx- PMH-   CURRENT MEDICATIONS: Current Outpatient Medications (Ophthalmic Drugs)  Medication Sig  . Propylene Glycol (SYSTANE COMPLETE) 0.6 % SOLN Place 1-2 drops into both eyes 4 (four) times daily as needed (dry eyes).    No current facility-administered medications for this visit.  (Ophthalmic Drugs)   Current Outpatient Medications (Other)  Medication Sig  . acyclovir ointment (ZOVIRAX) 5 % Apply 1 application topically daily as needed (cold sores).   [name] antiseptic oral rinse (BIOTENE) LIQD 15 mLs by Mouth Rinse route as needed for dry mouth.  [name] augmented betamethasone dipropionate (DIPROLENE-AF) 0.05 % ointment Apply 1 application topically 2 (two) times daily.  . baclofen (LIORESAL) 20 MG tablet Take 1 tablet (20 mg total) by mouth 4 (four) times daily.  [name]  Calcipotriene-Betameth Diprop (ENSTILAR) 0.005-0.064 % FOAM Apply 1 application topically 2 (two) times daily.   . clobetasol (TEMOVATE) 0.05 % GEL Apply 1 application topically 2 (two) times daily as needed (itching).  . clorazepate (TRANXENE) 15 MG tablet Take 15 mg by mouth at bedtime.  [name] desonide (DESOWEN) 0.05 % ointment Apply 1 application topically 2 (two) times daily as needed (psoriasis).   [name] diclofenac sodium (VOLTAREN) 1 % GEL APPLY TOPICALLY TO BOTH HANDS 3 TIMES DAILY.  [name] Emollient (CERAVE) CREA Apply 1 application topically 2 (two) times a day.  . Fluocinolone Acetonide 0.01 % OIL Place 3 drops into  both ears 2 (two) times daily as needed for itching.  . furosemide (LASIX) 40 MG tablet Take 1.5 tablets (60 mg total) by mouth daily.  . mirabegron ER (MYRBETRIQ) 50 MG TB24 tablet Take 1 tablet (50 mg total) by mouth daily.  [name] morphine (KADIAN) 60 MG 24 hr capsule Take 1 capsule (60 mg total) by mouth every 12 (twelve) hours.  [name] NEEDLE, DISP, 18 G 18G X 1-1/2" MISC 1 Units by Does not apply route once a week.  [name] NEEDLE, DISP, 23 G 23G X 3/4" MISC 1 Units by Does not apply route once a week.  . neomycin-polymyxin-hydrocortisone (CORTISPORIN) 3.5-10000-1 OTIC suspension Place 4 drops into both ears 2 (two) times daily as needed (ear pain).   . Oxycodone HCl 10 MG TABS Take 1 tablet (10 mg total) by mouth every 6 (six) hours as needed.  . potassium chloride (K-DUR) 10 MEQ tablet TAKE K-DUR 2 TABLETS 10 MEQ DAILY (Patient taking differently: Take 20 mEq by mouth daily. )  . prazosin (MINIPRESS) 2 MG capsule Take 4 mg by mouth at bedtime.   . rivaroxaban (XARELTO) 20 MG TABS tablet Take 1 tablet (20 mg total) by mouth at bedtime. TAKE 1 TABLET DAILY BEFORE BEDTIME  . Secukinumab (COSENTYX SENSOREADY PEN) 150 MG/ML SOAJ Inject 300 mg into the skin every 28 (twenty-eight) days.   . Syringe, Disposable, 1 ML MISC 1 Units by Does not apply route once a week.  . testosterone cypionate (DEPOTESTOSTERONE CYPIONATE) 200 MG/ML injection INJECT 0.5 MLS (100 MG TOTAL) INTO THE MUSCLE ONCE A WEEK.  . ursodiol (ACTIGALL) 500 MG tablet Take 500 mg by mouth 2 (two) times daily with a meal.   . venlafaxine XR (EFFEXOR-XR) 150 MG 24 hr capsule Take 2 capsules (300 mg total) by mouth daily with breakfast.  . clorazepate (TRANXENE) 7.5 MG tablet Take 7.5 mg by mouth 2 (two) times daily.   . ursodiol (ACTIGALL) 250 MG tablet Take 250 mg by mouth 2 (two) times daily with a meal.    No current facility-administered medications for this visit.  (Other)      REVIEW OF SYSTEMS: ROS    Positive for: Gastrointestinal,  Neurological, Genitourinary, Musculoskeletal, Cardiovascular, Eyes, Respiratory, Psychiatric   Negative for: Constitutional, Skin, HENT, Endocrine, Allergic/Imm, Heme/Lymph   Last edited by Matthew Folks, COA on 12/15/2018  1:33 PM. (History)       ALLERGIES Allergies  Allergen Reactions  . Gabapentin Nausea Only and Other (See Comments)    Other reaction(s): nausea, mental status Drowsiness and restlessness. Pt. States, " It makes me crazy, I can't take this medicine."  . Penicillin G Anaphylaxis and Other (See Comments)    Has patient had a PCN reaction causing immediate rash, facial/tongue/throat swelling, SOB or lightheadedness with hypotension: Yes Has patient had a PCN reaction causing severe rash involving mucus membranes or  skin necrosis: No Has patient had a PCN reaction that required hospitalization Yes Has patient had a PCN reaction occurring within the last 10 years: No If all of the above answers are "NO", then may proceed with Cephalosporin use.   . Sulfa Antibiotics Rash    Stevens-Johnson rash  . Tegaderm Chg Dressing  [Chlorhexidine] Swelling and Itching  . Vancomycin Rash and Other (See Comments)    RED MAN SYNDROME CAN HAVE IF GIVEN OVER 2HOURS  . Duloxetine Other (See Comments)    Restless legs  . Tegaderm Ag Mesh [Silver] Dermatitis    Causes blistering wounds   . Citalopram Other (See Comments)    Dystonia  . Wheat Bran     Due to Celiac  . Ibuprofen Other (See Comments)    Contraindicated with Xarelto.   . Nortriptyline Other (See Comments)    Dry mouth at 25 mg dose.  Tolerates 10 mg dose  . Pregabalin Other (See Comments)    Ineffective  . Sulfacetamide Sodium-Sulfur Rash    PAST MEDICAL HISTORY Past Medical History:  Diagnosis Date  . Abnormal weight loss   . Anxiety   . Arthritis   . Cataract    OU  . Celiac disease   . Cervical neck pain with evidence of disc disease    patient has a cyst   . Chronic constipation   . Chronic  diastolic heart failure ([facility])    Pt. denies  . Chronic pain   . Degenerative disc disease at L5-S1 level    with stenosis  . DVT (deep venous thrombosis) (H[facility])    Right upper arm, bilateral leg  . Eczema    inguinal, feet  . Elevated liver enzymes   . 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 Family history of adverse reaction to anesthesia    family has problems with anesthesia of nausea and vomiting   . 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 GERD (gastroesophageal reflux disease)    History of in 20's  . Gluten enteropathy   . H/O parotitis    right   . Hard of hearing   . History of kidney stones   . History of retinal tear    Bilateral  . History of staph infection    required wound vac  . Hx-TIA (transient ischemic attack)    2015  . LVH (left ventricular hypertrophy) 12/15/2016   Mild, noted on ECHO  . MVP (mitral valve prolapse)   . NAFL (nonalcoholic fatty liver)   . Neck pain   . Neuromuscular disorder ([place])    bilateral neuropathy feet.  . Pneumonia 12/17/2010  . Polycythemia   . Polycythemia, secondary   . PONV (postoperative nausea and vomiting)   . Protein C deficiency ([place])    Dr. Anne Fu  . Psoriasis    16 X10 cm psoriatic rash on sole of left foot ; open and occ scant bleeding;   . psoriatic arthritis   . PTSD (post-traumatic stress disorder)   . Scaphoid fracture of wrist 09/23/2013  . Seizure ([place])    childhood, medication until age 26 then weanned completely off  . Sleep apnea    split night study last done by Dr. [name] 06/18/15 shows severe OSA, CSA, and hypersomnia, rec bipap  . Splenomegaly   . Stenosis of ureteropelvic junction (UPJ)    left  . Stroke [facility])    CVA vs TIA in left cerebrum causing slight right sided weakness-Dr. [name] follows  . 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 Tachycardia    hx of   . Transfusion history    past history- none recent, after surgeries due to blood loss  . Wears glasses    . Wears hearing aid    Past Surgical History:  Procedure Laterality Date  . ABDOMINAL HYSTERECTOMY Bilateral 1994   TAH, BSO- tranverse incision at 53 yo  . ANKLE ARTHROSCOPY WITH RECONSTRUCTION Right 2007  . CHOLECYSTECTOMY     laparoscopic  . COLONOSCOPY     x3  . EYE SURGERY     Left eye 03/02/2018, right 02/15/2018  . HIP ARTHROSCOPY W/ LABRAL REPAIR Right 05/11/2013   acetabular labral tear 03/30/2013  . KNEE ARTHROPLASTY Right   . KNEE JOINT MANIPULATION Left    x3 under anesthesia  . KNEE SURGERY Bilateral 1984   Right ACL, left PCL repair  . LITHOTRIPSY  2005  . LIVER BIOPSY  2013   normal results.  [name] MASTECTOMY Bilateral    prior to 2009  . MOUTH SURGERY    . NASAL SEPTUM SURGERY N/A 09/20/2015   by ENT Dr. [name]  . OVARIAN CYST SURGERY Left    size of grapefruit, was informed that she had shortened vagina  . SHOULDER SURGERY Bilateral    Right 08/15/2016, Left 11/15/2016  . THUMB ARTHROSCOPY Left   . THYROIDECTOMY, PARTIAL Left 2008  . TOTAL KNEE ARTHROPLASTY Right 08/23/2018   Procedure: TOTAL KNEE ARTHROPLASTY;  Surgeon: [name], MD;  Location: [facility];  Service: Orthopedics;  Laterality: Right;  65mn  . TOTAL KNEE REVISION Left 02/06/2016   Procedure: LEFT TOTAL KNEE REVISION;  Surgeon: FGaynelle Arabian MD;  Location: [facility];  Service: Orthopedics;  Laterality: Left;  . TOTAL KNEE REVISION Left 04/22/2017   Procedure: Left knee polyethylene revision;  Surgeon: AGaynelle Arabian MD;  Location: [facility];  Service: Orthopedics;  Laterality: Left;  . UPPER GI ENDOSCOPY  2003    FAMILY HISTORY Family History  Problem Relation Age of Onset  . Stroke Maternal Grandfather        567 . Heart attack Maternal Grandfather   . Glaucoma Maternal Grandfather   . Macular degeneration Maternal Grandfather   . Hypertension Mother   . Psoriasis Mother   . Other Mother        meningioma developed ~2019  . Glaucoma Mother   . Cancer Paternal Grandfather   . Heart  attack Paternal Grandfather   . Stroke Paternal Uncle        age 53 . Polycythemia Paternal Uncle   . Stroke Maternal Grandmother   . Congestive Heart Failure Maternal Grandmother   . Heart attack Maternal Grandmother   . Protein C deficiency Sister 381      Miscarriages    SOCIAL HISTORY Social History   Tobacco Use  . Smoking status: Never Smoker  . Smokeless tobacco: Never Used  Substance Use Topics  . Alcohol use: Yes    Frequency: Never    Comment: social  . Drug use: No         OPHTHALMIC EXAM:  Base Eye Exam    Visual Acuity (Snellen - Linear)      Right Left   Dist [place] 20/40 + 20/40   Dist ph [place] 20/30 20/40 +2       Tonometry (Tonopen, 1:42 PM)      Right Left   Pressure 12 12  Pupils      Dark Light Shape React APD   Right 3 2 Round Brisk None   Left 3 2 Round Brisk None       Visual Fields (Counting fingers)      Left Right   Restrictions Partial outer superior temporal, inferior temporal, inferior nasal deficiencies Partial outer superior temporal deficiency       Extraocular Movement      Right Left    Full, Ortho Full, Ortho       Neuro/Psych    Oriented x3: Yes   Mood/Affect: Normal       Dilation    Both eyes: 1.0% Mydriacyl, 2.5% Phenylephrine @ 1:42 PM        Slit Lamp and Fundus Exam    Slit Lamp Exam      Right Left   Lids/Lashes mild Dermatochalasis - upper lid mild Dermatochalasis - upper lid   Conjunctiva/Sclera White and quiet White and quiet   Cornea trace Punctate epithelial erosions trace Punctate epithelial erosions   Anterior Chamber Deep and quiet Deep and quiet   Iris Round and dilated, +PPM Round and dilated, +PPM   Lens 1+ Nuclear sclerosis, 1+ Cortical cataract 1+ Nuclear sclerosis, 1+ Cortical cataract   Vitreous Vitreous syneresis Vitreous syneresis       Fundus Exam      Right Left   Disc Pink and Sharp, Compact, focal temporal PPP Pink and Sharp   C/D Ratio 0.2 0.3   Macula Flat, Blunted  foveal reflex, mild Retinal pigment epithelial mottling, No heme or edema Flat, Blunted foveal reflex, mild Retinal pigment epithelial mottling, No heme or edema   Vessels Mild Vascular attenuation, Tortuousity, mild AV crossing changes Mild Vascular attenuation, Tortuousity, mild AV crossing changes   Periphery Attached, patches of lattice with good laser at 0130, 0730   Attached, large patch of pigmented lattice superiorly from 1100-0130 with good laser, patch of lattice from 6962-9528 with good laser surrounding, patch from 0530-0600, no laser        Refraction    Manifest Refraction      Sphere Cylinder Axis Dist VA   Right [place] Sphere  20/40   Left -0.50 +0.25 070 20/40          IMAGING AND PROCEDURES  Imaging and Procedures for @TODAY @  OCT, Retina - OU - Both Eyes       Right Eye Quality was good. Central Foveal Thickness: 286. Progression has no prior data. Findings include normal foveal contour, no IRF, no SRF (Partial PVD).   Left Eye Quality was good. Central Foveal Thickness: 280. Progression has no prior data. Findings include normal foveal contour, no IRF, no SRF (Rare drusen, partial PVD).   Notes *Images captured and stored on drive  Diagnosis / Impression:  NFP, no IRF/SRF OU  Clinical management:  See below  Abbreviations: NFP - Normal foveal profile. CME - cystoid macular edema. PED - pigment epithelial detachment. IRF - intraretinal fluid. SRF - subretinal fluid. EZ - ellipsoid zone. ERM - epiretinal membrane. ORA - outer retinal atrophy. ORT - outer retinal tubulation. SRHM - subretinal hyper-reflective material                 ASSESSMENT/PLAN:    ICD-10-CM   1. 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 Retinal edema  H35.81 OCT, Retina - OU - Both Eyes    Pt of Dr. Zadie Rhine  w/ history of lattice degeneration OU s/p laser retinopexy OU who presents with 6 day history of decreased vision,  difficulty reading. - pt reports changes in speech and verbal expression that started 1-2 days prior to vision changes - BCVA 20/40 OU today, down from 20/25 at last visit w/ Dr. Zadie Rhine on 5.20.20 - dilated exam and OCT essentially normal - has good laser surrounding lattice OU - pt has history of small vessel ischemic disease, but retinal perfusion appears normal - no ocular intervention indicated at this time - recommend evaluation by Neurology -- pt in agreement and plans to call his Neurologist to set up an appointment - advised presentation to ED if any symptoms worsen - f/u with Dr. Zadie Rhine as scheduled   Ophthalmic Meds Ordered this visit:  No orders of the defined types were placed in this encounter.      Return if symptoms worsen or fail to improve.  There are no Patient Instructions on file for this visit.   Explained the diagnoses, plan, and follow up with the patient and they expressed understanding.  Patient expressed understanding of the importance of proper follow up care.   This document serves as a record of services personally performed by Gardiner Sleeper, MD, PhD. It was created on their behalf by Ernest Mallick, OA, an ophthalmic assistant. The creation of this record is the provider's dictation and/or activities during the visit.    Electronically signed by: Ernest Mallick, OA 10.14.2020 8:09 AM   Gardiner Sleeper, M.D., Ph.D. Diseases & Surgery of the Retina and Vitreous [facility]   I have reviewed the above documentation for accuracy and completeness, and I agree with the above. Gardiner Sleeper, M.D., Ph.D. 12/16/18 8:09 AM     Abbreviations: M myopia (nearsighted); A astigmatism; H hyperopia (farsighted); P presbyopia; Mrx spectacle prescription;  CTL contact lenses; OD right eye; OS left eye; OU both eyes  XT exotropia; ET esotropia; PEK punctate epithelial keratitis; PEE punctate epithelial erosions; DES dry eye syndrome; MGD  meibomian gland dysfunction; ATs artificial tears; PFAT's preservative free artificial tears; [place] nuclear sclerotic cataract; PSC posterior subcapsular cataract; ERM epi-retinal membrane; PVD posterior vitreous detachment; RD retinal detachment; DM diabetes mellitus; DR diabetic retinopathy; NPDR non-proliferative diabetic retinopathy; PDR proliferative diabetic retinopathy; CSME clinically significant macular edema; DME diabetic macular edema; dbh dot blot hemorrhages; CWS cotton wool spot; POAG primary open angle glaucoma; C/D cup-to-disc ratio; HVF humphrey visual field; GVF goldmann visual field; OCT optical coherence tomography; IOP intraocular pressure; BRVO Branch retinal vein occlusion; CRVO central retinal vein occlusion; CRAO central retinal artery occlusion; BRAO branch retinal artery occlusion; RT retinal tear; SB scleral buckle; PPV pars plana vitrectomy; VH Vitreous hemorrhage; PRP panretinal laser photocoagulation; IVK intravitreal kenalog; VMT vitreomacular traction; MH Macular hole;  NVD neovascularization of the disc; NVE neovascularization elsewhere; AREDS age related eye disease study; ARMD age related macular degeneration; POAG primary open angle glaucoma; EBMD epithelial/anterior basement membrane dystrophy; ACIOL anterior chamber intraocular lens; IOL intraocular lens; PCIOL posterior chamber intraocular lens; Phaco/IOL phacoemulsification with intraocular lens placement; [place] photorefractive keratectomy; LASIK laser assisted in situ keratomileusis; HTN hypertension; DM diabetes mellitus; COPD chronic obstructive pulmonary disease

## 2018-12-16 ENCOUNTER — Ambulatory Visit (INDEPENDENT_AMBULATORY_CARE_PROVIDER_SITE_OTHER): Payer: Medicare Other | Admitting: Psychology

## 2018-12-16 ENCOUNTER — Other Ambulatory Visit (HOSPITAL_COMMUNITY): Payer: Self-pay | Admitting: Physical Medicine & Rehabilitation

## 2018-12-16 DIAGNOSIS — F4312 Post-traumatic stress disorder, chronic: Secondary | ICD-10-CM

## 2018-12-16 DIAGNOSIS — M4712 Other spondylosis with myelopathy, cervical region: Secondary | ICD-10-CM

## 2018-12-21 ENCOUNTER — Ambulatory Visit (INDEPENDENT_AMBULATORY_CARE_PROVIDER_SITE_OTHER): Payer: Medicare Other | Admitting: Psychology

## 2018-12-21 DIAGNOSIS — F4312 Post-traumatic stress disorder, chronic: Secondary | ICD-10-CM

## 2018-12-23 ENCOUNTER — Ambulatory Visit (INDEPENDENT_AMBULATORY_CARE_PROVIDER_SITE_OTHER): Payer: Medicare Other | Admitting: Psychology

## 2018-12-23 DIAGNOSIS — F4312 Post-traumatic stress disorder, chronic: Secondary | ICD-10-CM | POA: Diagnosis not present

## 2018-12-28 ENCOUNTER — Ambulatory Visit: Payer: BLUE CROSS/BLUE SHIELD | Admitting: Psychology

## 2018-12-30 ENCOUNTER — Ambulatory Visit (INDEPENDENT_AMBULATORY_CARE_PROVIDER_SITE_OTHER): Payer: Medicare Other | Admitting: Psychology

## 2018-12-30 DIAGNOSIS — F4312 Post-traumatic stress disorder, chronic: Secondary | ICD-10-CM

## 2018-12-30 DIAGNOSIS — F4323 Adjustment disorder with mixed anxiety and depressed mood: Secondary | ICD-10-CM

## 2018-12-31 ENCOUNTER — Ambulatory Visit (HOSPITAL_COMMUNITY)
Admission: EM | Admit: 2018-12-31 | Discharge: 2018-12-31 | Disposition: A | Discharge status: Home / Self Care | Payer: Medicare Other | Attending: Emergency Medicine | Admitting: Emergency Medicine

## 2018-12-31 ENCOUNTER — Encounter (HOSPITAL_COMMUNITY): Payer: Self-pay

## 2018-12-31 ENCOUNTER — Other Ambulatory Visit: Payer: Self-pay

## 2018-12-31 DIAGNOSIS — W260XXA Contact with knife, initial encounter: Secondary | ICD-10-CM

## 2018-12-31 DIAGNOSIS — S61012A Laceration without foreign body of left thumb without damage to nail, initial encounter: Secondary | ICD-10-CM

## 2018-12-31 NOTE — ED Provider Notes (Addendum)
HPI  SUBJECTIVE:  Jesse Bowers is a right handed  53 y.o. male who presents with a laceration to the distal aspect of his left thumb sustained 2 hr PTA.  States that he was cutting some plastic with a brand-new, clean hunting knife and accidentally slipped, cutting his thumb.  He reports throbbing, constant soreness.  He states that it bled for several hours.  He is on Xarelto.  States that it continues to ooze.  No foreign body sensation, tingling.  No nail damage.  He states that he has a small bruise on the thumb pad and is numb in this area.  He washed it out extensively with Dial soap and tap water and applied Steri-Strips to try and get the bleeding to stop.  No alleviating factors.  Symptoms are worse with palpation.  He has a past medical history of stroke, protein C deficiency, polycythemia vera.  No history of diabetes.  He is not a smoker.  Tetanus is up-to-date.  [id number], Opal Sidles, MD   Past Medical History:  Diagnosis Date   Abnormal weight loss    Anxiety    Arthritis    Cataract    OU   Celiac disease    Cervical neck pain with evidence of disc disease    patient has a cyst    Chronic constipation    Chronic diastolic heart failure (HCC)    Pt. 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   Family history of adverse reaction to anesthesia    family has problems with anesthesia of nausea and vomiting    Male-to-male transgender person    GERD (gastroesophageal reflux disease)    History of in 20's   Gluten enteropathy    H/O parotitis    right    Hard of hearing    History of kidney stones    History of retinal tear    Bilateral   History of staph infection    required wound vac   Hx-TIA (transient ischemic attack)    2015   LVH (left ventricular  hypertrophy) 12/15/2016   Mild, noted on ECHO   MVP (mitral valve prolapse)    NAFL (nonalcoholic fatty liver)    Neck pain    Neuromuscular disorder ([place])    bilateral neuropathy feet.   Pneumonia 12/17/2010   Polycythemia    Polycythemia, secondary    PONV (postoperative nausea and vomiting)    Protein C deficiency (HCC)    Dr. Anne Fu   Psoriasis    16 X10 cm psoriatic rash on sole of left foot ; open and occ scant bleeding;    psoriatic arthritis    PTSD (post-traumatic stress disorder)    Scaphoid fracture of wrist 09/23/2013   Seizure ([place])    childhood, medication until age 47 then weanned completely off   Sleep apnea    split night study last done by Dr. [name] 06/18/15 shows severe OSA, CSA, and hypersomnia, rec bipap   Splenomegaly    Stenosis of ureteropelvic junction (UPJ)    left   Stroke [facility])    CVA vs TIA in left cerebrum causing slight right sided weakness-Dr. [name] follows   Syrinx of spinal cord ([place]) 01/06/2014  c spine on MRI   Tachycardia    hx of    Transfusion history    past history- none recent, after surgeries due to blood loss   Wears glasses    Wears hearing aid     Past Surgical History:  Procedure Laterality Date   ABDOMINAL HYSTERECTOMY Bilateral 1994   TAH, BSO- tranverse incision at 53 yo   ANKLE ARTHROSCOPY WITH RECONSTRUCTION Right 2007   CHOLECYSTECTOMY     laparoscopic   COLONOSCOPY     x3   EYE SURGERY     Left eye 03/02/2018, right 02/15/2018   HIP ARTHROSCOPY W/ LABRAL REPAIR Right 05/11/2013   acetabular labral tear 03/30/2013   KNEE ARTHROPLASTY Right    KNEE JOINT MANIPULATION Left    x3 under anesthesia   KNEE SURGERY Bilateral 1984   Right ACL, left PCL repair   LITHOTRIPSY  2005   LIVER BIOPSY  2013   normal results.   MASTECTOMY Bilateral    prior to 2009   MOUTH SURGERY     NASAL SEPTUM SURGERY N/A 09/20/2015   by ENT Dr. [name]   OVARIAN CYST SURGERY Left    size of grapefruit, was informed that she  had shortened vagina   SHOULDER SURGERY Bilateral    Right 08/15/2016, Left 11/15/2016   THUMB ARTHROSCOPY Left    THYROIDECTOMY, PARTIAL Left 2008   TOTAL KNEE ARTHROPLASTY Right 08/23/2018   Procedure: TOTAL KNEE ARTHROPLASTY;  Surgeon: [name], MD;  Location: [facility];  Service: Orthopedics;  Laterality: Right;  56mn   TOTAL KNEE REVISION Left 02/06/2016   Procedure: LEFT TOTAL KNEE REVISION;  Surgeon: FGaynelle Arabian MD;  Location: [facility];  Service: Orthopedics;  Laterality: Left;   TOTAL KNEE REVISION Left 04/22/2017   Procedure: Left knee polyethylene revision;  Surgeon: AGaynelle Arabian MD;  Location: [facility];  Service: Orthopedics;  Laterality: Left;   UPPER GI ENDOSCOPY  2003    Family History  Problem Relation Age of Onset   Stroke Maternal Grandfather        55  Heart attack Maternal Grandfather    Glaucoma Maternal Grandfather    Macular degeneration Maternal Grandfather    Hypertension Mother    Psoriasis Mother    Other Mother        meningioma developed ~2019   Glaucoma Mother    Cancer Paternal Grandfather    Heart attack Paternal Grandfather    Stroke Paternal Uncle        age 53  Polycythemia Paternal Uncle    Stroke Maternal Grandmother    Congestive Heart Failure Maternal Grandmother    Heart attack Maternal Grandmother    Protein C deficiency Sister 38      Miscarriages    Social History   Tobacco Use   Smoking status: Never Smoker   Smokeless tobacco: Never Used  Substance Use Topics   Alcohol use: Yes    Frequency: Never    Comment: social   Drug use: No    No current facility-administered medications for this encounter.   Current Outpatient Medications:    acyclovir ointment (ZOVIRAX) 5 %, Apply 1 application topically daily as needed (cold sores). , Disp: , Rfl: 0   antiseptic oral rinse (BIOTENE) LIQD, 15 mLs by Mouth Rinse route as needed for dry mouth., Disp: , Rfl:    augmented betamethasone dipropionate (DIPROLENE-AF) 0.05 %  ointment, Apply 1 application topically 2 (two) times daily., Disp: , Rfl:  baclofen (LIORESAL) 20 MG tablet, Take 1 tablet (20 mg total) by mouth 4 (four) times daily., Disp: 120 tablet, Rfl: 3   Calcipotriene-Betameth Diprop (ENSTILAR) 0.005-0.064 % FOAM, Apply 1 application topically 2 (two) times daily. , Disp: , Rfl:    clobetasol (TEMOVATE) 0.05 % GEL, Apply 1 application topically 2 (two) times daily as needed (itching)., Disp: , Rfl:    clorazepate (TRANXENE) 15 MG tablet, Take 15 mg by mouth at bedtime., Disp: , Rfl:    clorazepate (TRANXENE) 7.5 MG tablet, Take 7.5 mg by mouth 2 (two) times daily. , Disp: , Rfl:    desonide (DESOWEN) 0.05 % ointment, Apply 1 application topically 2 (two) times daily as needed (psoriasis). , Disp: , Rfl: 2   diclofenac sodium (VOLTAREN) 1 % GEL, APPLY TOPICALLY TO BOTH HANDS 3 TIMES DAILY., Disp: 300 g, Rfl: 4   Emollient (CERAVE) CREA, Apply 1 application topically 2 (two) times a day., Disp: , Rfl:    Fluocinolone Acetonide 0.01 % OIL, Place 3 drops into both ears 2 (two) times daily as needed for itching., Disp: , Rfl:    furosemide (LASIX) 40 MG tablet, Take 1.5 tablets (60 mg total) by mouth daily., Disp: 30 tablet, Rfl: 5   mirabegron ER (MYRBETRIQ) 50 MG TB24 tablet, Take 1 tablet (50 mg total) by mouth daily., Disp: 30 tablet, Rfl: 11   morphine (KADIAN) 60 MG 24 hr capsule, Take 1 capsule (60 mg total) by mouth every 12 (twelve) hours., Disp: 60 capsule, Rfl: 0   NEEDLE, DISP, 18 G 18G X 1-1/2" MISC, 1 Units by Does not apply route once a week., Disp: 50 each, Rfl: 2   NEEDLE, DISP, 23 G 23G X 3/4" MISC, 1 Units by Does not apply route once a week., Disp: 100 each, Rfl: 1   neomycin-polymyxin-hydrocortisone (CORTISPORIN) 3.5-10000-1 OTIC suspension, Place 4 drops into both ears 2 (two) times daily as needed (ear pain). , Disp: , Rfl: 1   Oxycodone HCl 10 MG TABS, Take 1 tablet (10 mg total) by mouth every 6 (six) hours as needed., Disp: 120  tablet, Rfl: 0   potassium chloride (K-DUR) 10 MEQ tablet, TAKE K-DUR 2 TABLETS 10 MEQ DAILY (Patient taking differently: Take 20 mEq by mouth daily. ), Disp: 60 tablet, Rfl: 11   prazosin (MINIPRESS) 2 MG capsule, Take 4 mg by mouth at bedtime. , Disp: , Rfl:    Propylene Glycol (SYSTANE COMPLETE) 0.6 % SOLN, Place 1-2 drops into both eyes 4 (four) times daily as needed (dry eyes). , Disp: , Rfl:    rivaroxaban (XARELTO) 20 MG TABS tablet, Take 1 tablet (20 mg total) by mouth at bedtime. TAKE 1 TABLET DAILY BEFORE BEDTIME, Disp: 90 tablet, Rfl: 3   Secukinumab (COSENTYX SENSOREADY PEN) 150 MG/ML SOAJ, Inject 300 mg into the skin every 28 (twenty-eight) days. , Disp: , Rfl:    Syringe, Disposable, 1 ML MISC, 1 Units by Does not apply route once a week., Disp: 60 each, Rfl: 2   testosterone cypionate (DEPOTESTOSTERONE CYPIONATE) 200 MG/ML injection, INJECT 0.5 MLS (100 MG TOTAL) INTO THE MUSCLE ONCE A WEEK., Disp: 10 mL, Rfl: 0   ursodiol (ACTIGALL) 250 MG tablet, Take 250 mg by mouth 2 (two) times daily with a meal. , Disp: , Rfl:    ursodiol (ACTIGALL) 500 MG tablet, Take 500 mg by mouth 2 (two) times daily with a meal. , Disp: , Rfl:    venlafaxine XR (EFFEXOR-XR) 150 MG 24 hr capsule,  Take 2 capsules (300 mg total) by mouth daily with breakfast., Disp: 60 capsule, Rfl: 5  Allergies  Allergen Reactions   Gabapentin Nausea Only and Other (See Comments)    Other reaction(s): nausea, mental status Drowsiness and restlessness. Pt. States, " It makes me crazy, I can't take this medicine."   Penicillin G Anaphylaxis and Other (See Comments)    Has patient had a PCN reaction causing immediate rash, facial/tongue/throat swelling, SOB or lightheadedness with hypotension: Yes Has patient had a PCN reaction causing severe rash involving mucus membranes or skin necrosis: No Has patient had a PCN reaction that required hospitalization Yes Has patient had a PCN reaction occurring within the last 10 years:  No If all of the above answers are "NO", then may proceed with Cephalosporin use.    Sulfa Antibiotics Rash    Stevens-Johnson rash   Tegaderm Chg Dressing  [Chlorhexidine] Swelling and Itching   Vancomycin Rash and Other (See Comments)    RED MAN SYNDROME   CAN HAVE IF GIVEN OVER 2HOURS   Duloxetine Other (See Comments)    Restless legs   Tegaderm Ag Mesh [Silver] Dermatitis    Causes blistering wounds    Citalopram Other (See Comments)    Dystonia   Wheat Bran     Due to Celiac   Ibuprofen Other (See Comments)    Contraindicated with Xarelto.    Nortriptyline Other (See Comments)    Dry mouth at 25 mg dose.  Tolerates 10 mg dose   Pregabalin Other (See Comments)    Ineffective   Sulfacetamide Sodium-Sulfur Rash     ROS  As noted in HPI.   Physical Exam  BP (!) 127/96 (BP Location: Left Arm)   Pulse 87   Resp 16   SpO2 97%    Constitutional: Well developed, well nourished, no acute distress Eyes:  EOMI, conjunctiva normal bilaterally HENT: Normocephalic, atraumatic,mucus membranes moist Respiratory: Normal inspiratory effort Cardiovascular: Normal rate GI: nondistended skin: No rash, skin intact Musculoskeletal: Clean linear laceration distal aspect left thumb.  Two-point discrimination intact on the side.  Decreased on the thumb pad.  Small amount of oozing.  No nail damage.  Flexion extension of the finger intact.      Neurologic: Alert & oriented x 3, no focal neuro deficits Psychiatric: Speech and behavior appropriate   ED Course   Medications - No data to display  No orders of the defined types were placed in this encounter.   No results found. However, due to the size of the patient record, not all encounters were searched. Please check Results Review for a complete set of results. No results found.  ED Clinical Impression  1. Laceration of left thumb without foreign body without damage to nail, initial encounter      ED  Assessment/Plan  Patient irrigated the wound out extensively with tap water and antibacterial soap prior to arrival.  It was oozing here, so placed Surgicel, then Steri-Stripped the wound closed.  Patient tolerated procedure well.  Tetanus is up-to-date.  Do not think that he needs antibiotics since this was a clean cut with a brand-new knife.  Local wound care as needed.  Tylenol as needed for pain.  Follow up with PMD as needed.  No orders of the defined types were placed in this encounter.   *This clinic note was created using Dragon dictation software. Therefore, there may be occasional mistakes despite careful proofreading.   ?addendum: 05/10/22 1420- changed identifier from "adult" to "  male" per patient request. No other edits to the chart made.     Melynda Ripple, MD 01/01/19 [id number]    Melynda Ripple, MD 05/10/22 1421

## 2018-12-31 NOTE — Discharge Instructions (Addendum)
1 g of Tylenol 3-4 times a day as needed for pain, ice it for 20 minutes at a time.

## 2018-12-31 NOTE — ED Triage Notes (Addendum)
Pt presents to UC w/ left thumb laceration today. Pt cut his thumb with a knife. Pt states his last tetanus shot was 01/2014

## 2019-01-01 IMAGING — US US EXTREM LOW VENOUS*L*
1 series · 13 of 24 positions shown · non-contrast
Comparison: None.

CLINICAL DATA: 51-year-old male with a history of pain



[Series 1: us extrem low venous*left* · 0.08mm/px · 13 of 29 slices shown]
[im 1/29]
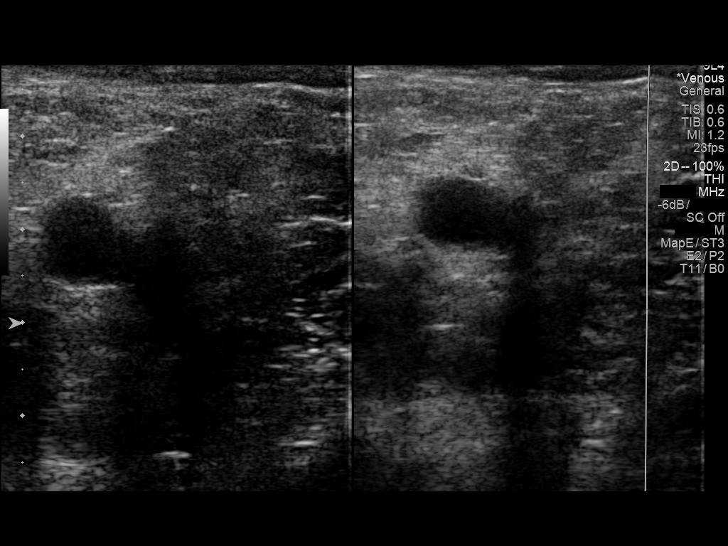
[im 3/29]
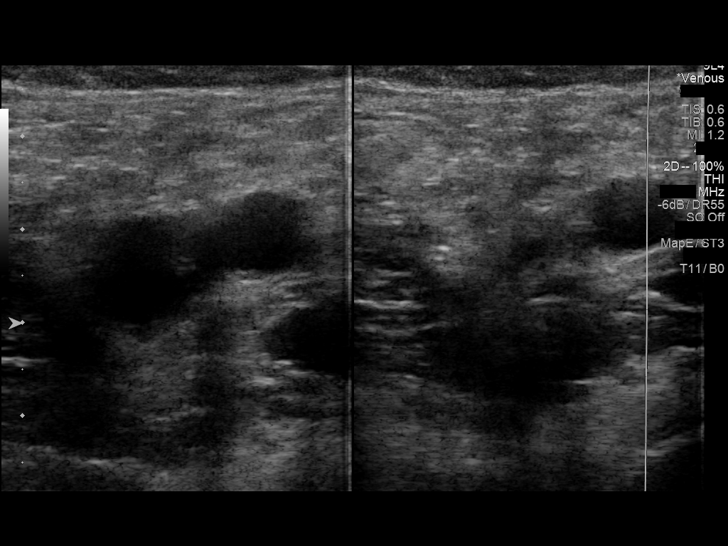
[im 5/29]
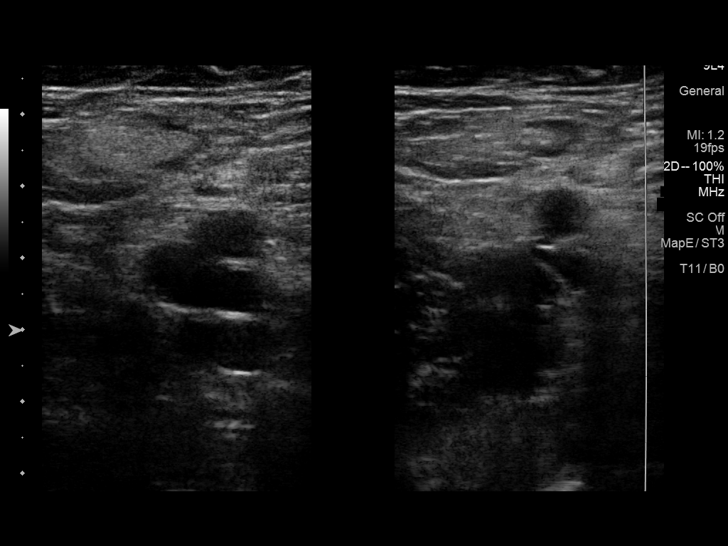
[im 8/29]
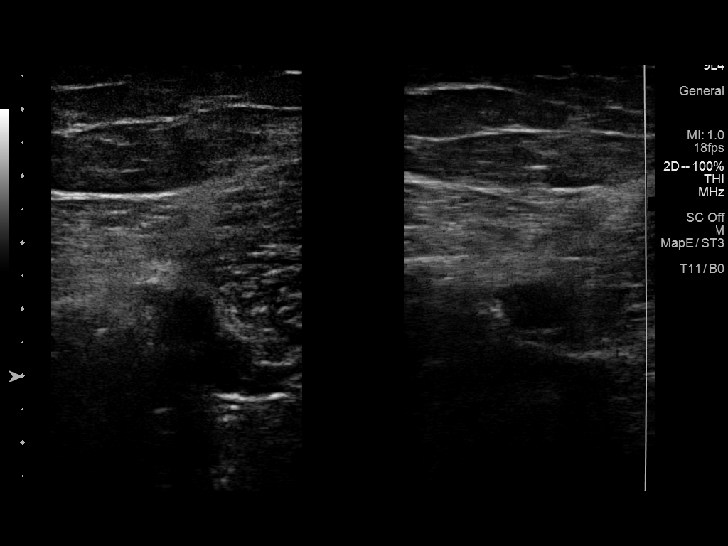
[im 10/29]
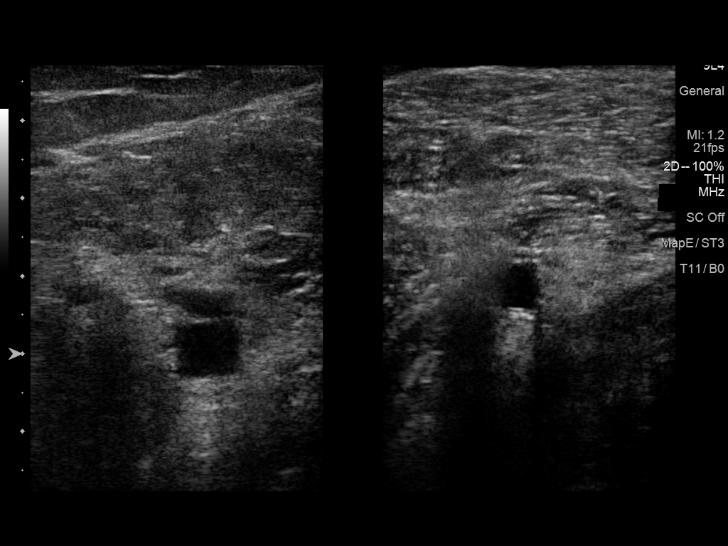
[im 13/29]
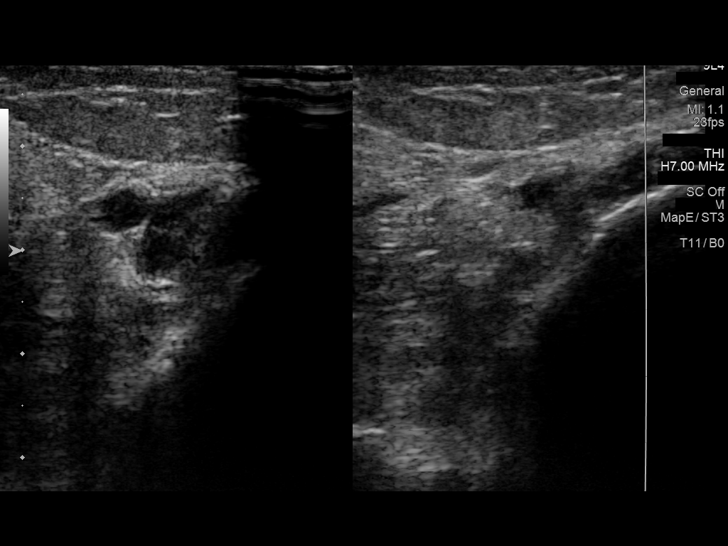
[im 15/29]
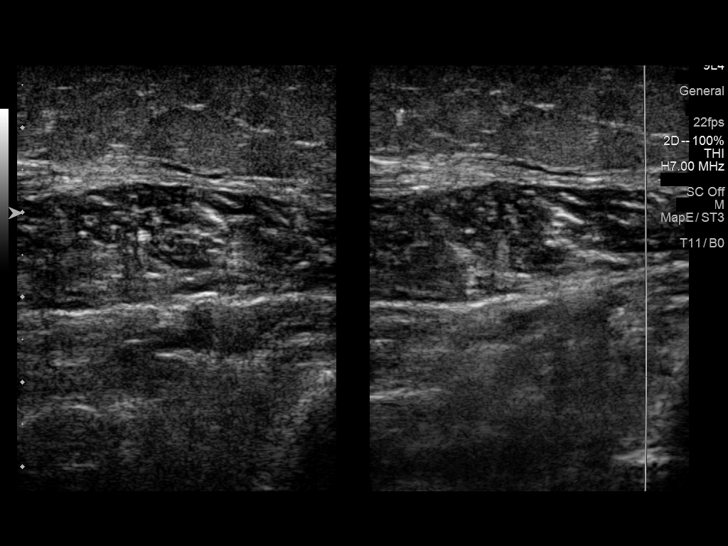
[im 16/29]
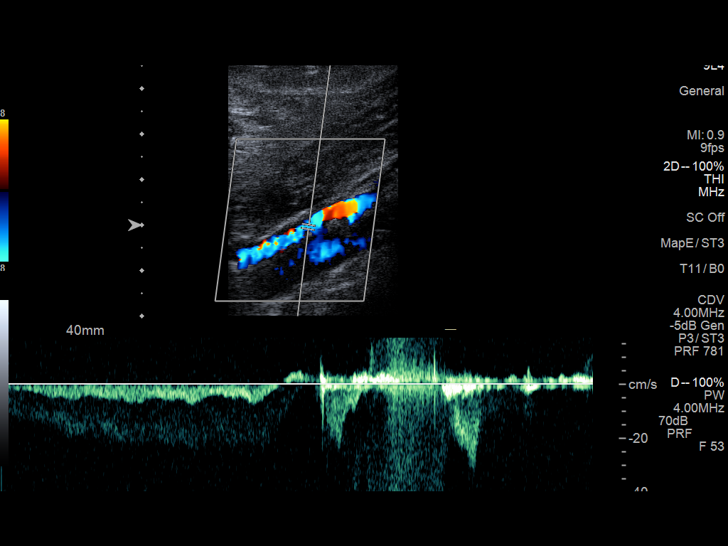
[im 19/29]
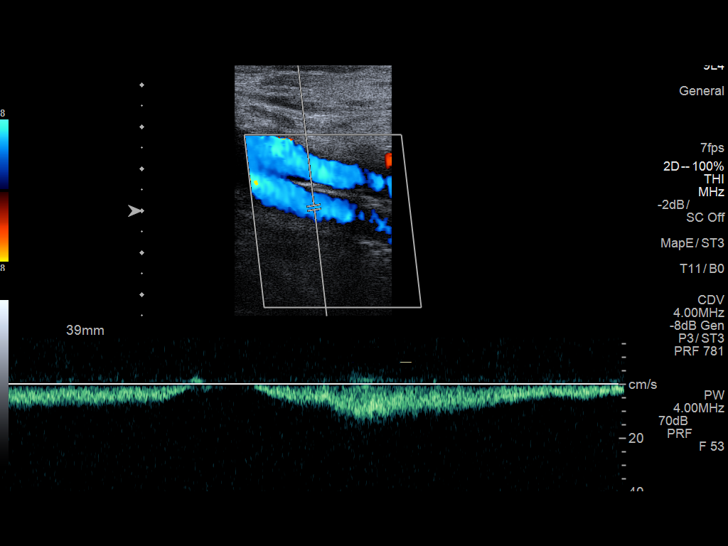
[im 21/29]
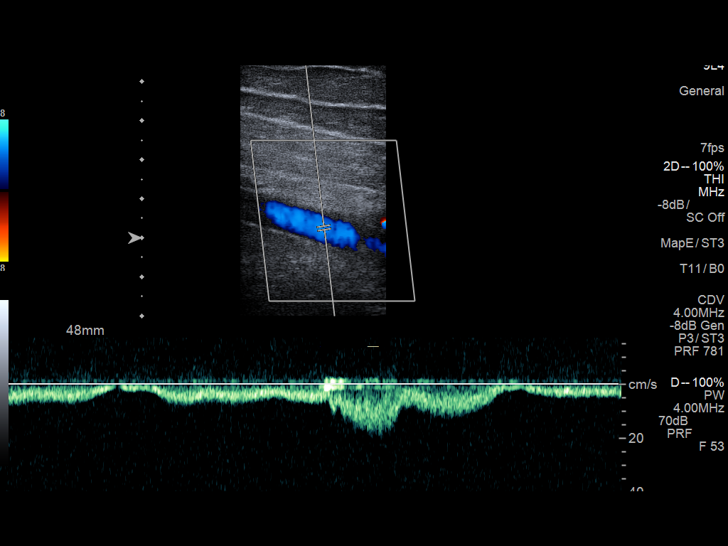
[im 24/29]
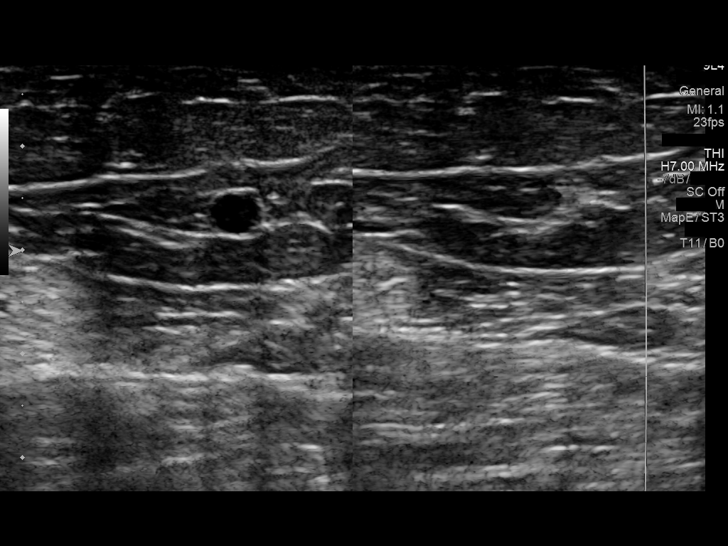
[im 26/29]
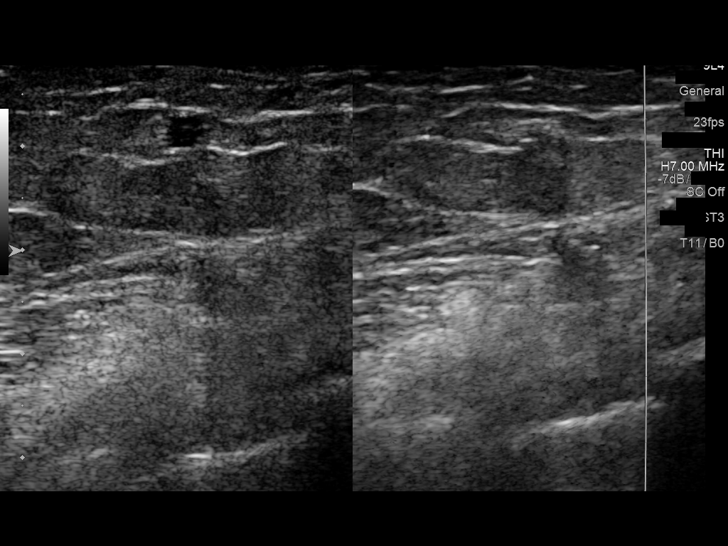
[im 29/29]
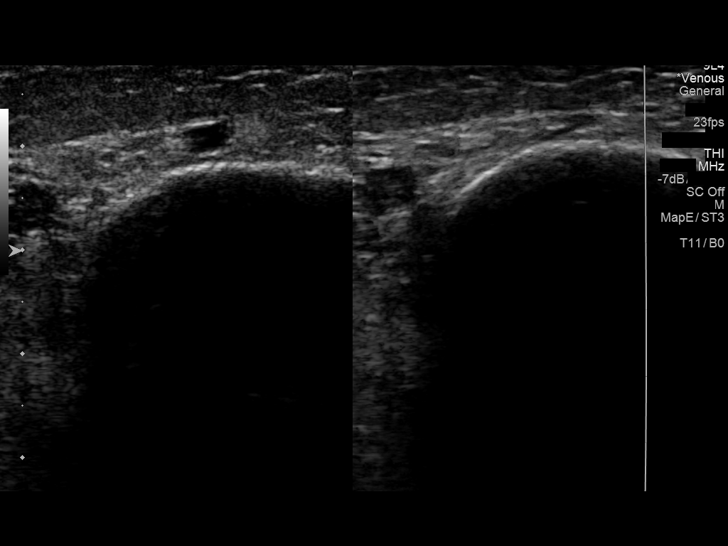

[13 of 24 positions shown; findings below may reference images not displayed]

FINDINGS: Contralateral Common Femoral Vein: Respiratory phasicity is normal
and symmetric with the symptomatic side. No evidence of thrombus.
Normal compressibility.

Common Femoral Vein: No evidence of thrombus. Normal
compressibility, respiratory phasicity and response to augmentation.

Saphenofemoral Junction: No evidence of thrombus. Normal
compressibility and flow on color Doppler imaging.

Profunda Femoral Vein: No evidence of thrombus. Normal
compressibility and flow on color Doppler imaging.

Femoral Vein: No evidence of thrombus. Normal compressibility,
respiratory phasicity and response to augmentation.

Popliteal Vein: No evidence of thrombus. Normal compressibility,
respiratory phasicity and response to augmentation.

Calf Veins: No evidence of thrombus. Normal compressibility and flow
on color Doppler imaging.

Superficial Great Saphenous Vein: No evidence of thrombus. Normal
compressibility and flow on color Doppler imaging.

Other Findings:  None.
IMPRESSION: Sonographic survey of the left lower extremity negative for DVT

## 2019-01-04 ENCOUNTER — Ambulatory Visit (INDEPENDENT_AMBULATORY_CARE_PROVIDER_SITE_OTHER): Payer: BC Managed Care – PPO | Admitting: Psychology

## 2019-01-04 DIAGNOSIS — F4312 Post-traumatic stress disorder, chronic: Secondary | ICD-10-CM

## 2019-01-06 ENCOUNTER — Ambulatory Visit (INDEPENDENT_AMBULATORY_CARE_PROVIDER_SITE_OTHER): Payer: Medicare Other | Admitting: Psychology

## 2019-01-06 DIAGNOSIS — F4312 Post-traumatic stress disorder, chronic: Secondary | ICD-10-CM | POA: Diagnosis not present

## 2019-01-07 ENCOUNTER — Other Ambulatory Visit: Payer: Self-pay

## 2019-01-07 MED ORDER — POTASSIUM CHLORIDE ER 10 MEQ PO TBCR: EXTENDED_RELEASE_TABLET | ORAL | 11 refills | Status: DC

## 2019-01-10 ENCOUNTER — Encounter: Payer: BC Managed Care – PPO | Attending: Physical Medicine & Rehabilitation | Admitting: Registered Nurse

## 2019-01-10 ENCOUNTER — Other Ambulatory Visit: Payer: Self-pay

## 2019-01-10 ENCOUNTER — Encounter: Payer: Self-pay | Admitting: Registered Nurse

## 2019-01-10 VITALS — BP 120/77 | HR 93 | Temp 97.7°F

## 2019-01-10 DIAGNOSIS — M542 Cervicalgia: Secondary | ICD-10-CM | POA: Diagnosis not present

## 2019-01-10 DIAGNOSIS — K900 Celiac disease: Secondary | ICD-10-CM | POA: Diagnosis not present

## 2019-01-10 DIAGNOSIS — I69320 Aphasia following cerebral infarction: Secondary | ICD-10-CM | POA: Insufficient documentation

## 2019-01-10 DIAGNOSIS — Z79899 Other long term (current) drug therapy: Secondary | ICD-10-CM | POA: Insufficient documentation

## 2019-01-10 DIAGNOSIS — Z87890 Personal history of sex reassignment: Secondary | ICD-10-CM | POA: Diagnosis not present

## 2019-01-10 DIAGNOSIS — Z86718 Personal history of other venous thrombosis and embolism: Secondary | ICD-10-CM | POA: Insufficient documentation

## 2019-01-10 DIAGNOSIS — S43002A Unspecified subluxation of left shoulder joint, initial encounter: Secondary | ICD-10-CM | POA: Insufficient documentation

## 2019-01-10 DIAGNOSIS — Z7901 Long term (current) use of anticoagulants: Secondary | ICD-10-CM | POA: Diagnosis not present

## 2019-01-10 DIAGNOSIS — D6859 Other primary thrombophilia: Secondary | ICD-10-CM | POA: Diagnosis not present

## 2019-01-10 DIAGNOSIS — Z9181 History of falling: Secondary | ICD-10-CM | POA: Insufficient documentation

## 2019-01-10 DIAGNOSIS — I69398 Other sequelae of cerebral infarction: Secondary | ICD-10-CM | POA: Insufficient documentation

## 2019-01-10 DIAGNOSIS — M5416 Radiculopathy, lumbar region: Secondary | ICD-10-CM | POA: Diagnosis present

## 2019-01-10 DIAGNOSIS — Z96652 Presence of left artificial knee joint: Secondary | ICD-10-CM

## 2019-01-10 DIAGNOSIS — R252 Cramp and spasm: Secondary | ICD-10-CM

## 2019-01-10 DIAGNOSIS — I69351 Hemiplegia and hemiparesis following cerebral infarction affecting right dominant side: Secondary | ICD-10-CM | POA: Diagnosis not present

## 2019-01-10 DIAGNOSIS — D751 Secondary polycythemia: Secondary | ICD-10-CM | POA: Diagnosis not present

## 2019-01-10 DIAGNOSIS — F418 Other specified anxiety disorders: Secondary | ICD-10-CM | POA: Diagnosis not present

## 2019-01-10 DIAGNOSIS — M7522 Bicipital tendinitis, left shoulder: Secondary | ICD-10-CM | POA: Diagnosis not present

## 2019-01-10 DIAGNOSIS — M13862 Other specified arthritis, left knee: Secondary | ICD-10-CM | POA: Diagnosis not present

## 2019-01-10 DIAGNOSIS — M7061 Trochanteric bursitis, right hip: Secondary | ICD-10-CM | POA: Diagnosis not present

## 2019-01-10 DIAGNOSIS — L4050 Arthropathic psoriasis, unspecified: Secondary | ICD-10-CM

## 2019-01-10 DIAGNOSIS — Z96651 Presence of right artificial knee joint: Secondary | ICD-10-CM

## 2019-01-10 DIAGNOSIS — Z79891 Long term (current) use of opiate analgesic: Secondary | ICD-10-CM

## 2019-01-10 DIAGNOSIS — M7062 Trochanteric bursitis, left hip: Secondary | ICD-10-CM

## 2019-01-10 DIAGNOSIS — Z5181 Encounter for therapeutic drug level monitoring: Secondary | ICD-10-CM

## 2019-01-10 DIAGNOSIS — M545 Low back pain: Secondary | ICD-10-CM | POA: Diagnosis not present

## 2019-01-10 DIAGNOSIS — R209 Unspecified disturbances of skin sensation: Secondary | ICD-10-CM | POA: Diagnosis not present

## 2019-01-10 DIAGNOSIS — G8929 Other chronic pain: Secondary | ICD-10-CM | POA: Diagnosis present

## 2019-01-10 DIAGNOSIS — G894 Chronic pain syndrome: Secondary | ICD-10-CM

## 2019-01-10 MED ORDER — MORPHINE SULFATE ER 60 MG PO CP24: 60.0000 mg | ORAL_CAPSULE | Freq: Two times a day (BID) | ORAL | 0 refills | Status: DC

## 2019-01-10 MED ORDER — OXYCODONE HCL 10 MG PO TABS: 10.0000 mg | ORAL_TABLET | Freq: Four times a day (QID) | ORAL | 0 refills | Status: DC | PRN

## 2019-01-10 MED ORDER — BACLOFEN 20 MG PO TABS: 20.0000 mg | ORAL_TABLET | Freq: Four times a day (QID) | ORAL | 3 refills | Status: DC

## 2019-01-10 NOTE — Progress Notes (Signed)
Subjective:    Patient ID: Jesse Bowers, adult    DOB: [birth year], 53 y.o.   MRN: [id number]  HPI: Jesse Bowers is a 53 y.o. choose not to disclose who returns for follow up appointment for chronic pain and medication refill. He states his pain is located in  his neck, mid- lower back radiating into his bilateral lower extremities, bilateral hip pain and bilateral knee pain. Also reports left thumb pain s/ p laceration he went to [facility] on 12/31/2018, note was reviewed.  He rates his  pain 7. His current exercise regime is walking with walker in his home  and performing stretching and YOGA  Exercises.  Jesse Bowers Morphine equivalent is 180.00 MME.  Last Oral Swab was Performed on 11/10/2018 it was consistent.    Pain Inventory Average Pain 9 Pain Right Now 7 My pain is constant, sharp, dull, tingling and aching  In the last 24 hours, has pain interfered with the following? General activity 10 Relation with others 9 Enjoyment of life 10 What TIME of day is your pain at its worst? daytime Sleep (in general) Poor  Pain is worse with: walking, bending, sitting, standing and some activites Pain improves with: rest, heat/ice and medication Relief from Meds: 4  Mobility walk with assistance use a walker ability to climb steps?  no do you drive?  yes Do you have any goals in this area?  yes  Function disabled: date disabled . I need assistance with the following:  bathing, toileting, meal prep, household duties and shopping Do you have any goals in this area?  yes  Neuro/Psych bowel control problems weakness numbness tremor trouble walking spasms anxiety loss of taste or smell  Prior Studies Any changes since last visit?  no  Physicians involved in your care Any changes since last visit?  no   Family History  Problem Relation Age of Onset  . Stroke Maternal Grandfather        10  . Heart attack Maternal Grandfather   . Glaucoma Maternal Grandfather    . Macular degeneration Maternal Grandfather   . Hypertension Mother   . Psoriasis Mother   . Other Mother        meningioma developed ~2019  . Glaucoma Mother   . Cancer Paternal Grandfather   . Heart attack Paternal Grandfather   . Stroke Paternal Uncle        age 4  . Polycythemia Paternal Uncle   . Stroke Maternal Grandmother   . Congestive Heart Failure Maternal Grandmother   . Heart attack Maternal Grandmother   . Protein C deficiency Sister 9       Miscarriages   Social History   Socioeconomic History  . Marital status: Married    Spouse name: Not on file  . Number of children: 2  . Years of education: 4y college  . Highest education level: Not on file  Occupational History  . Occupation: Pediatric Nurse practitioner    Comment: Not working since [place] 2015  Social Needs  . Financial resource strain: Not on file  . Food insecurity    Worry: Not on file    Inability: Not on file  . Transportation needs    Medical: Not on file    Non-medical: Not on file  Tobacco Use  . Smoking status: Never Smoker  . Smokeless tobacco: Never Used  Substance and Sexual Activity  . Alcohol use: Yes    Frequency: Never    Comment:  social  . Drug use: No  . Sexual activity: Yes    Birth control/protection: None    Comment: patient is a transgender on testosterone shots, no biological kids  Lifestyle  . Physical activity    Days per week: Not on file    Minutes per session: Not on file  . Stress: Not on file  Relationships  . Social Herbalist on phone: Not on file    Gets together: Not on file    Attends religious service: Not on file    Active member of club or organization: Not on file    Attends meetings of clubs or organizations: Not on file    Relationship status: Not on file  Other Topics Concern  . Not on file  Social History Narrative   Education 4 year college, former Therapist, sports X 15 years, pediatric nurse practitioner x 6 years, did NP degree from  [employer] [place]. Relocated to [place] about 2 months ago from [place]. Patient was in [place] for last 4 years and prior to that in [place]. His wife is working as Scientist, research (physical sciences) for [employer]. Patient is not working and applying for disability. They have 2 kids but no biologic children.    Past Surgical History:  Procedure Laterality Date  . ABDOMINAL HYSTERECTOMY Bilateral 1994   TAH, BSO- tranverse incision at 53 yo  . ANKLE ARTHROSCOPY WITH RECONSTRUCTION Right 2007  . CHOLECYSTECTOMY     laparoscopic  . COLONOSCOPY     x3  . EYE SURGERY     Left eye 03/02/2018, right 02/15/2018  . HIP ARTHROSCOPY W/ LABRAL REPAIR Right 05/11/2013   acetabular labral tear 03/30/2013  . KNEE ARTHROPLASTY Right   . KNEE JOINT MANIPULATION Left    x3 under anesthesia  . KNEE SURGERY Bilateral 1984   Right ACL, left PCL repair  . LITHOTRIPSY  2005  . LIVER BIOPSY  2013   normal results.  [name] MASTECTOMY Bilateral    prior to 2009  . MOUTH SURGERY    . NASAL SEPTUM SURGERY N/A 09/20/2015   by ENT Dr. [name]  . OVARIAN CYST SURGERY Left    size of grapefruit, was informed that she had shortened vagina  . SHOULDER SURGERY Bilateral    Right 08/15/2016, Left 11/15/2016  . THUMB ARTHROSCOPY Left   . THYROIDECTOMY, PARTIAL Left 2008  . TOTAL KNEE ARTHROPLASTY Right 08/23/2018   Procedure: TOTAL KNEE ARTHROPLASTY;  Surgeon: [name], MD;  Location: [facility];  Service: Orthopedics;  Laterality: Right;  48mn  . TOTAL KNEE REVISION Left 02/06/2016   Procedure: LEFT TOTAL KNEE REVISION;  Surgeon: FGaynelle Arabian MD;  Location: [facility];  Service: Orthopedics;  Laterality: Left;  . TOTAL KNEE REVISION Left 04/22/2017   Procedure: Left knee polyethylene revision;  Surgeon: AGaynelle Arabian MD;  Location: [facility];  Service: Orthopedics;  Laterality: Left;  . UPPER GI ENDOSCOPY  2003   Past Medical History:  Diagnosis Date  . Abnormal weight loss   . Anxiety   . Arthritis   . Cataract    OU  .  Celiac disease   . Cervical neck pain with evidence of disc disease    patient has a cyst   . Chronic constipation   . Chronic diastolic heart failure ([place]    Pt. denies  . Chronic pain   . Degenerative disc disease at L5-S1 level    with stenosis  . DVT (deep venous thrombosis) ([place]  Right upper arm, bilateral leg  . Eczema    inguinal, feet  . Elevated liver enzymes   . 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 Family history of adverse reaction to anesthesia    family has problems with anesthesia of nausea and vomiting   . 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 GERD (gastroesophageal reflux disease)    History of in 20's  . Gluten enteropathy   . H/O parotitis    right   . Hard of hearing   . History of kidney stones   . History of retinal tear    Bilateral  . History of staph infection    required wound vac  . Hx-TIA (transient ischemic attack)    2015  . LVH (left ventricular hypertrophy) 12/15/2016   Mild, noted on ECHO  . MVP (mitral valve prolapse)   . NAFL (nonalcoholic fatty liver)   . Neck pain   . Neuromuscular disorder ([place])    bilateral neuropathy feet.  . Pneumonia 12/17/2010  . Polycythemia   . Polycythemia, secondary   . PONV (postoperative nausea and vomiting)   . Protein C deficiency ([place])    Dr. Anne Fu  . Psoriasis    16 X10 cm psoriatic rash on sole of left foot ; open and occ scant bleeding;   . psoriatic arthritis   . PTSD (post-traumatic stress disorder)   . Scaphoid fracture of wrist 09/23/2013  . Seizure ([place])    childhood, medication until age 22 then weanned completely off  . Sleep apnea    split night study last done by Dr. [name] 06/18/15 shows severe OSA, CSA, and hypersomnia, rec bipap  . Splenomegaly   . Stenosis of ureteropelvic junction (UPJ)    left  . Stroke [facility])    CVA vs TIA in left cerebrum causing slight right sided weakness-Dr. [name] follows  . Syrinx of spinal cord ([place]) 01/06/2014   c spine on MRI   . Tachycardia    hx of   . Transfusion history    past history- none recent, after surgeries due to blood loss  . Wears glasses   . Wears hearing aid    BP 120/77   Pulse 93   SpO2 95%   Opioid Risk Score:   Fall Risk Score:  `1  Depression screen PHQ 2/9  Depression screen [facility] 2/9 02/02/2018 09/23/2017  Decreased Interest 0 0  Down, Depressed, Hopeless 0 0  PHQ - 2 Score 0 0  Some recent data might be hidden    Review of Systems  Constitutional: Negative.   HENT: Negative.   Eyes: Negative.   Respiratory: Negative.   Cardiovascular: Negative.   Gastrointestinal: Negative.   Endocrine: Negative.   Genitourinary: Negative.   Musculoskeletal: Positive for arthralgias, back pain, gait problem, myalgias, neck pain and neck stiffness.       Spasms   Allergic/Immunologic: Negative.   Neurological: Positive for tremors, weakness, numbness and headaches.  Psychiatric/Behavioral: The patient is nervous/anxious.   All other systems reviewed and are negative.      Objective:   Physical Exam Vitals signs and nursing note reviewed.  Constitutional:      Appearance: Normal appearance.  Neck:     Musculoskeletal: Normal range of motion and neck supple.     Comments: Cervical Paraspinal Tenderness: C-5-C-6 Cardiovascular:     Rate and Rhythm: Normal rate and regular rhythm.     Pulses: Normal pulses.     Heart sounds: Normal heart sounds.  Pulmonary:     Effort: Pulmonary effort is normal.     Breath sounds: Normal breath sounds.  Musculoskeletal:     Comments: Normal Muscle Bulk and Muscle Testing Reveals:  Upper Extremities: Full ROM and Muscle Strength 5/5 Bilateral AC Joint Tenderness  Thoracic Hypersensitivity: T-1-T-7 Lumbar Paraspinal Tenderness: L-3-L-5 Bilateral Greater Trochanter Tenderness Lower Extremities: Full ROM and Muscle Strength 5/5 Bilateral Lower Extremities Flexion Produces Pain into Bilateral Patella's Arrived in wheelchair   Gait   Skin:     General: Skin is warm and dry.  Neurological:     Mental Status: He is alert and oriented to person, place, and time.  Psychiatric:        Mood and Affect: Mood normal.        Behavior: Behavior normal.           Assessment & Plan:  1. Psoriatic arthritis with pain in multiple areas, most prominently feet, hands, elbows. Refilled:Kadian 60 mg 24 hr. Capsule, one capsule every 12 hours #60, and Oxycodone 10 mg one tablet every6hours as needed #120.Second script of Morphine sent for the following month. 01/10/2019 We will continue the opioid monitoring program, this consists of regular clinic visits, examinations, urine drug screen, pill counts as well as use of [place] Controlled Substance Reporting System.  Rheumatology Following. 2. Chronic Pain Syndrome: Continue Compound Cream.01/10/2019 3. 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 Wynelle Link perform Total Left Knee Revision on 02/06/2016. S/PLeft Knee Revision on 04/22/17.S/PRight TKA on 08/23/2018 with Dr. Wynelle Link. 5. Chronic mid- low back pain: Continue current medication regime, and encourage to increase activity as tolerated.01/10/2019 6. Polycythemia: Oncology Following.01/10/2019 7. Depression with anxiety : Psychiatry following.Continue to Monitor.01/10/2019 8. Cervicalgia:Continue HEP as tolerated and Continue to Monitor. Continue current medication regime. 01/10/2019 9. Muscle Spasticity: ContinueBaclofen11/11/2018 10.Right Ankle Pain: No complaints Today. Continue to Monitor. 01/10/2019 10. Left lateral epicondylitis:No complaints today.Ortho Following.01/10/2019 11.BilateralShoulder Pain:No complaints today.Ortho  Following.01/10/2019 12. Lumbar Radiculitis:S/PLeft L5-S1 TranslaminarInjection on 07/09/2018 with, good relief noted.01/10/2019 13.BilateralGreater Trochanteric Bursitis: Continue with Ice and Heat Therapy: Contiue HEP as Tolerated. Continue to Monitor. 01/10/2019.  20 minutes of face to face patient care time was spent during this visit. All questions were encouraged and answered.  F/U in 2 months

## 2019-01-11 ENCOUNTER — Ambulatory Visit: Payer: Medicare Other | Admitting: Psychology

## 2019-01-13 ENCOUNTER — Ambulatory Visit (INDEPENDENT_AMBULATORY_CARE_PROVIDER_SITE_OTHER): Payer: Medicare Other | Admitting: Psychology

## 2019-01-13 ENCOUNTER — Ambulatory Visit: Payer: Medicare Other | Admitting: Psychology

## 2019-01-13 DIAGNOSIS — F4312 Post-traumatic stress disorder, chronic: Secondary | ICD-10-CM | POA: Diagnosis not present

## 2019-01-17 ENCOUNTER — Telehealth: Payer: Self-pay | Admitting: Urology

## 2019-01-17 NOTE — Telephone Encounter (Signed)
Spoke with patient-referral was sent on 12/10/18-patient was aware-he has not received a call about status or appointment. Gave phone number and contact information to patient PH# [phone].Clinic was closed today-left a message to return my call to check on referral status. Patient verbalized understanding.

## 2019-01-17 NOTE — Telephone Encounter (Signed)
Pt LMOM and states that he has been waiting for a referral to the transgender clinic and would like a call back to discuss.

## 2019-01-18 ENCOUNTER — Ambulatory Visit (INDEPENDENT_AMBULATORY_CARE_PROVIDER_SITE_OTHER): Payer: BC Managed Care – PPO | Admitting: Psychology

## 2019-01-18 DIAGNOSIS — F4312 Post-traumatic stress disorder, chronic: Secondary | ICD-10-CM | POA: Diagnosis not present

## 2019-01-20 ENCOUNTER — Ambulatory Visit (INDEPENDENT_AMBULATORY_CARE_PROVIDER_SITE_OTHER): Payer: BC Managed Care – PPO | Admitting: Psychology

## 2019-01-20 DIAGNOSIS — F4323 Adjustment disorder with mixed anxiety and depressed mood: Secondary | ICD-10-CM | POA: Diagnosis not present

## 2019-01-25 ENCOUNTER — Ambulatory Visit (INDEPENDENT_AMBULATORY_CARE_PROVIDER_SITE_OTHER): Payer: BC Managed Care – PPO | Admitting: Psychology

## 2019-01-25 DIAGNOSIS — F4312 Post-traumatic stress disorder, chronic: Secondary | ICD-10-CM | POA: Diagnosis not present

## 2019-02-01 ENCOUNTER — Ambulatory Visit (INDEPENDENT_AMBULATORY_CARE_PROVIDER_SITE_OTHER): Payer: BC Managed Care – PPO | Admitting: Psychology

## 2019-02-01 DIAGNOSIS — F4312 Post-traumatic stress disorder, chronic: Secondary | ICD-10-CM | POA: Diagnosis not present

## 2019-02-01 NOTE — Telephone Encounter (Signed)
OK to increase the furosemide Rx to 60 mg daily. Probably cheapest to do one and a half of the 40 mg tabs daily, but if he prefers separate Rx for the 40 mg and 20 mg, that is fine with me.

## 2019-02-03 ENCOUNTER — Ambulatory Visit (INDEPENDENT_AMBULATORY_CARE_PROVIDER_SITE_OTHER): Payer: BC Managed Care – PPO | Admitting: Psychology

## 2019-02-03 DIAGNOSIS — F4312 Post-traumatic stress disorder, chronic: Secondary | ICD-10-CM | POA: Diagnosis not present

## 2019-02-08 ENCOUNTER — Other Ambulatory Visit: Payer: Self-pay | Admitting: *Deleted

## 2019-02-08 ENCOUNTER — Ambulatory Visit (INDEPENDENT_AMBULATORY_CARE_PROVIDER_SITE_OTHER): Payer: BC Managed Care – PPO | Admitting: Psychology

## 2019-02-08 DIAGNOSIS — F4323 Adjustment disorder with mixed anxiety and depressed mood: Secondary | ICD-10-CM | POA: Diagnosis not present

## 2019-02-08 MED ORDER — FUROSEMIDE 20 MG PO TABS: 20.0000 mg | ORAL_TABLET | Freq: Every day | ORAL | 5 refills | Status: DC

## 2019-02-08 MED ORDER — FUROSEMIDE 40 MG PO TABS: 40.0000 mg | ORAL_TABLET | Freq: Every day | ORAL | 5 refills | Status: DC

## 2019-02-10 ENCOUNTER — Ambulatory Visit (INDEPENDENT_AMBULATORY_CARE_PROVIDER_SITE_OTHER): Payer: BC Managed Care – PPO | Admitting: Psychology

## 2019-02-10 ENCOUNTER — Ambulatory Visit: Payer: Medicare Other | Admitting: Psychology

## 2019-02-10 DIAGNOSIS — F4312 Post-traumatic stress disorder, chronic: Secondary | ICD-10-CM

## 2019-02-15 ENCOUNTER — Ambulatory Visit (INDEPENDENT_AMBULATORY_CARE_PROVIDER_SITE_OTHER): Payer: BC Managed Care – PPO | Admitting: Psychology

## 2019-02-15 DIAGNOSIS — F4312 Post-traumatic stress disorder, chronic: Secondary | ICD-10-CM

## 2019-02-16 ENCOUNTER — Telehealth: Payer: Self-pay | Admitting: *Deleted

## 2019-02-16 NOTE — Telephone Encounter (Signed)
More Detail >>  FW: Appointment Request  [name]  Sent: Wed February 16, 2019 7:15 AM  To: [facility] Triage   Harjot Dibello  MRN: [id number] DOB: 09-Nov-1965  Pt Work: [phone] Pt Home: [phone]  Entered: [phone]      Message  Please see complete note in EPIC  ----- Message -----  From: [name]  Sent: 02/16/2019 12:15 AM EST  To: [facility] Scheduling  Subject: RE: Appointment Request               Thank you for the appointment time. That time is absolutely perfect. I didn't notice your second sentence on my phone, yes, please have a nurse give me a call about symptoms I'm experiencing Thanks so much for your attention. I apologize for my lack of attention. : )    [name]    Appointment Request  [name]  [facility] Triage 3 hours ago (7:16 AM)   Please see complete note in EPIC   Message text   [name]  Patient Appointment Schedule Request Pool 10 hours ago (12:15 AM)   Thank you for the appointment time. That time is absolutely perfect. I didn't notice your second sentence on my phone, yes, please have a nurse give me a call about symptoms I'm experiencing  Thanks so much for your attention. I apologize for my lack of attention. : )  Clydene Laming 6 days ago   Good morning.  I have you scheduled to see Dr. [name] 05/25/19 at 10:00 am.  Would you like for me to have the nurse call you regarding your symptoms?    [name]  Patient Appointment Schedule Request Pool 7 days ago   Appointment Request From: [name]  With Provider: Sanda Klein, MD [facility]]  Preferred Date Range: 05/02/2019 - 06/01/2019  Preferred Times: Any Time  Reason for visit: Office Visit  Comments: 48monthcheck. constant little cough, started when minipress stopped, still w/dyspnea, worse, more dizziness but on opioids, benzo's (asked 2 stop benzo's per PCP).  Shrink  said "Dr. CLoletha Grayerdenied HR issues & said BP issues w/minipress" so ur staying on it: 1434m I tapered off 34m64m off. BP is pre-HTN #'s w/out minipress & still tachycardic, even at rest. Edema @ least 3x/mo. heart disease/CHF rampant on both sides of family, every generation since 1875. Avg starting age is 45-67-55vg death: 60-20-70  Pt declined appt today Per pt will continue to monitor  HR and B/P and if continues to worsen will call back ./cy

## 2019-02-17 ENCOUNTER — Ambulatory Visit (INDEPENDENT_AMBULATORY_CARE_PROVIDER_SITE_OTHER): Payer: BC Managed Care – PPO | Admitting: Psychology

## 2019-02-17 DIAGNOSIS — F4323 Adjustment disorder with mixed anxiety and depressed mood: Secondary | ICD-10-CM | POA: Diagnosis not present

## 2019-02-22 ENCOUNTER — Ambulatory Visit (INDEPENDENT_AMBULATORY_CARE_PROVIDER_SITE_OTHER): Payer: BC Managed Care – PPO | Admitting: Psychology

## 2019-02-22 DIAGNOSIS — F4323 Adjustment disorder with mixed anxiety and depressed mood: Secondary | ICD-10-CM | POA: Diagnosis not present

## 2019-02-23 ENCOUNTER — Other Ambulatory Visit: Payer: Self-pay

## 2019-02-23 ENCOUNTER — Inpatient Hospital Stay: Payer: BC Managed Care – PPO

## 2019-02-23 ENCOUNTER — Inpatient Hospital Stay: Payer: BC Managed Care – PPO | Attending: Hematology

## 2019-02-23 ENCOUNTER — Encounter: Payer: Self-pay | Admitting: Hematology

## 2019-02-23 DIAGNOSIS — Z7901 Long term (current) use of anticoagulants: Secondary | ICD-10-CM | POA: Insufficient documentation

## 2019-02-23 DIAGNOSIS — Z8673 Personal history of transient ischemic attack (TIA), and cerebral infarction without residual deficits: Secondary | ICD-10-CM | POA: Diagnosis not present

## 2019-02-23 DIAGNOSIS — Z79890 Hormone replacement therapy (postmenopausal): Secondary | ICD-10-CM | POA: Insufficient documentation

## 2019-02-23 DIAGNOSIS — Z79899 Other long term (current) drug therapy: Secondary | ICD-10-CM | POA: Insufficient documentation

## 2019-02-23 DIAGNOSIS — D45 Polycythemia vera: Secondary | ICD-10-CM

## 2019-02-23 DIAGNOSIS — Z86718 Personal history of other venous thrombosis and embolism: Secondary | ICD-10-CM | POA: Insufficient documentation

## 2019-02-23 DIAGNOSIS — D751 Secondary polycythemia: Secondary | ICD-10-CM | POA: Diagnosis present

## 2019-02-23 DIAGNOSIS — M069 Rheumatoid arthritis, unspecified: Secondary | ICD-10-CM | POA: Insufficient documentation

## 2019-02-23 LAB — COMPREHENSIVE METABOLIC PANEL
ALT: 22 U/L (ref 0–44)
AST: 23 U/L (ref 15–41)
Albumin: 4.2 g/dL (ref 3.5–5.0)
Alkaline Phosphatase: 227 U/L — ABNORMAL HIGH (ref 38–126)
Anion gap: 10 (ref 5–15)
BUN: 14 mg/dL (ref 6–20)
CO2: 26 mmol/L (ref 22–32)
Calcium: 8.8 mg/dL — ABNORMAL LOW (ref 8.9–10.3)
Chloride: 102 mmol/L (ref 98–111)
Creatinine, Ser: 1.09 mg/dL (ref 0.61–1.24)
GFR calc Af Amer: >60 mL/min (ref >60)
GFR calc non Af Amer: >60 mL/min (ref >60)
Glucose, Bld: 104 mg/dL — ABNORMAL HIGH (ref 70–99)
Potassium: 3.9 mmol/L (ref 3.5–5.1)
Sodium: 138 mmol/L (ref 135–145)
Total Bilirubin: 0.5 mg/dL (ref 0.3–1.2)
Total Protein: 7 g/dL (ref 6.5–8.1)

## 2019-02-23 LAB — CBC WITH DIFFERENTIAL/PLATELET
Abs Immature Granulocytes: 0.01 10*3/uL (ref 0.00–0.07)
Basophils Absolute: 0 10*3/uL (ref 0.0–0.1)
Basophils Relative: 0 %
Eosinophils Absolute: 0 10*3/uL (ref 0.0–0.5)
Eosinophils Relative: 0 %
HCT: 48.5 % (ref 39.0–52.0)
Hemoglobin: 14.8 g/dL (ref 13.0–17.0)
Immature Granulocytes: 0 %
Lymphocytes Relative: 27 %
Lymphs Abs: 1.2 10*3/uL (ref 0.7–4.0)
MCH: 24.4 pg — ABNORMAL LOW (ref 26.0–34.0)
MCHC: 30.5 g/dL (ref 30.0–36.0)
MCV: 79.9 fL — ABNORMAL LOW (ref 80.0–100.0)
Monocytes Absolute: 0.4 10*3/uL (ref 0.1–1.0)
Monocytes Relative: 9 %
Neutro Abs: 2.8 10*3/uL (ref 1.7–7.7)
Neutrophils Relative %: 64 %
Platelets: 222 10*3/uL (ref 150–400)
RBC: 6.07 MIL/uL — ABNORMAL HIGH (ref 4.22–5.81)
RDW: 17.2 % — ABNORMAL HIGH (ref 11.5–15.5)
WBC: 4.4 10*3/uL (ref 4.0–10.5)
nRBC: 0 % (ref 0.0–0.2)

## 2019-02-23 NOTE — Progress Notes (Signed)
phe

## 2019-02-23 NOTE — Progress Notes (Signed)
Jesse Bowers presents today for phlebotomy per MD orders. Phlebotomy procedure started at 1446 and ended at 1453 with 550 grams removed via 16 G to RAC.  Patient observed for 30 minutes after procedure without any incident. Patient tolerated procedure well. Diet and nutrition offered.

## 2019-02-24 ENCOUNTER — Ambulatory Visit: Payer: Medicare Other | Admitting: Psychology

## 2019-02-28 ENCOUNTER — Telehealth: Payer: Self-pay | Admitting: Hematology

## 2019-02-28 NOTE — Telephone Encounter (Signed)
Scheduled appt per per 12/24 sch message - pt is aware of appt date and time

## 2019-03-03 ENCOUNTER — Telehealth: Payer: Self-pay

## 2019-03-03 ENCOUNTER — Ambulatory Visit: Payer: Medicare Other | Admitting: Psychology

## 2019-03-03 DIAGNOSIS — L4050 Arthropathic psoriasis, unspecified: Secondary | ICD-10-CM

## 2019-03-03 DIAGNOSIS — M5416 Radiculopathy, lumbar region: Secondary | ICD-10-CM

## 2019-03-03 MED ORDER — MORPHINE SULFATE ER 60 MG PO CP24: 60.0000 mg | ORAL_CAPSULE | Freq: Two times a day (BID) | ORAL | 0 refills | Status: DC

## 2019-03-03 MED ORDER — OXYCODONE HCL 10 MG PO TABS: 10.0000 mg | ORAL_TABLET | Freq: Four times a day (QID) | ORAL | 0 refills | Status: DC | PRN

## 2019-03-03 NOTE — Telephone Encounter (Signed)
PMP was reviewed: Morphine and Oxycodone e-scribed to accommodate scheduled appointment. Jesse Bowers has an appointment  With our office on 03/21/2019. He will be notified of the above via My-Chart message.

## 2019-03-03 NOTE — Telephone Encounter (Signed)
Patient called requesting a refill for both morphine and oxycodone medications.  According to pmp, last refills were:  Fill Date ID Written                               Drug           Qty      Days Prescriber 01/31/2019 1 01/10/2019 Morphine Sulfate Er 60 Mg Cap  60.00 30 Eu Tho 01/31/2019 1 01/10/2019 Oxycodone Hcl 10 Mg Tablet     120.00 30 Eu Tho  According to patient, he will be out this weekend.

## 2019-03-08 ENCOUNTER — Other Ambulatory Visit: Payer: Self-pay

## 2019-03-08 ENCOUNTER — Ambulatory Visit: Payer: BC Managed Care – PPO | Admitting: Occupational Therapy

## 2019-03-08 ENCOUNTER — Ambulatory Visit: Payer: Medicare Other | Admitting: Psychology

## 2019-03-08 ENCOUNTER — Ambulatory Visit: Payer: BC Managed Care – PPO | Attending: Physical Medicine & Rehabilitation | Admitting: Physical Therapy

## 2019-03-08 DIAGNOSIS — R2681 Unsteadiness on feet: Secondary | ICD-10-CM | POA: Diagnosis present

## 2019-03-08 DIAGNOSIS — R29898 Other symptoms and signs involving the musculoskeletal system: Secondary | ICD-10-CM

## 2019-03-08 DIAGNOSIS — G8929 Other chronic pain: Secondary | ICD-10-CM | POA: Diagnosis present

## 2019-03-08 DIAGNOSIS — M6281 Muscle weakness (generalized): Secondary | ICD-10-CM | POA: Insufficient documentation

## 2019-03-08 DIAGNOSIS — R278 Other lack of coordination: Secondary | ICD-10-CM | POA: Diagnosis present

## 2019-03-08 DIAGNOSIS — R2689 Other abnormalities of gait and mobility: Secondary | ICD-10-CM | POA: Diagnosis present

## 2019-03-08 DIAGNOSIS — M79641 Pain in right hand: Secondary | ICD-10-CM | POA: Diagnosis present

## 2019-03-08 DIAGNOSIS — M25562 Pain in left knee: Secondary | ICD-10-CM | POA: Insufficient documentation

## 2019-03-08 DIAGNOSIS — M25561 Pain in right knee: Secondary | ICD-10-CM | POA: Diagnosis present

## 2019-03-08 DIAGNOSIS — R29818 Other symptoms and signs involving the nervous system: Secondary | ICD-10-CM | POA: Insufficient documentation

## 2019-03-08 NOTE — Therapy (Signed)
[facility] [address] [place], [place], [address] Phone: [phone]   Fax:  [phone]  Occupational Therapy Evaluation  Patient Details  Name: Jesse Bowers MRN: [id number] Date of Birth: Oct 31, 1965 Referring Provider (OT): Dr. [name]   Encounter Date: 03/08/2019  OT End of Session - 03/08/19 1518    Visit Number  1    Number of Visits  13    Date for OT Re-Evaluation  04/23/19    Authorization Type  BC/BS primary, MCR secondary (however may lose part B) - 30 visit limit combined PT/OT (Same day counts as 1 visit)    Authorization - Visit Number  1    Authorization - Number of Visits  15    OT Start Time  1230    OT Stop Time  1315    OT Time Calculation (min)  45 min    Activity Tolerance  Patient tolerated treatment well    Behavior During Therapy  [facility] for tasks assessed/performed       Past Medical History:  Diagnosis Date  . Abnormal weight loss   . Anxiety   . Arthritis   . Cataract    OU  . Celiac disease   . Cervical neck pain with evidence of disc disease    patient has a cyst   . Chronic constipation   . Chronic diastolic heart failure ([place])    Pt. denies  . Chronic pain   . Degenerative disc disease at L5-S1 level    with stenosis  . DVT (deep venous thrombosis) (HCC)    Right upper arm, bilateral leg  . Eczema    inguinal, feet  . Elevated liver enzymes   . 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 Family history of adverse reaction to anesthesia    family has problems with anesthesia of nausea and vomiting   . 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 GERD (gastroesophageal reflux disease)    History of in 20's  . Gluten enteropathy   . H/O parotitis    right   . Hard of hearing   . History of kidney stones   . History of retinal tear    Bilateral  . History of staph infection    required wound vac  . Hx-TIA (transient ischemic attack)    2015  . LVH (left ventricular  hypertrophy) 12/15/2016   Mild, noted on ECHO  . MVP (mitral valve prolapse)   . NAFL (nonalcoholic fatty liver)   . Neck pain   . Neuromuscular disorder ([place])    bilateral neuropathy feet.  . Pneumonia 12/17/2010  . Polycythemia   . Polycythemia, secondary   . PONV (postoperative nausea and vomiting)   . Protein C deficiency ([place])    Dr. Anne Fu  . Psoriasis    16 X10 cm psoriatic rash on sole of left foot ; open and occ scant bleeding;   . psoriatic arthritis   . PTSD (post-traumatic stress disorder)   . Scaphoid fracture of wrist 09/23/2013  . Seizure ([place])    childhood, medication until age 29 then weanned completely off  . Sleep apnea    split night study last done by Dr. [name] 06/18/15 shows severe OSA, CSA, and hypersomnia, rec bipap  . Splenomegaly   . Stenosis of ureteropelvic junction (UPJ)    left  . Stroke [facility])    CVA vs TIA in left cerebrum causing slight right sided weakness-Dr. [name] follows  . 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 Tachycardia    hx of   . Transfusion history    past history- none recent, after surgeries due to blood loss  . Wears glasses   . Wears hearing aid     Past Surgical History:  Procedure Laterality Date  . ABDOMINAL HYSTERECTOMY Bilateral 1994   TAH, BSO- tranverse incision at 54 yo  . ANKLE ARTHROSCOPY WITH RECONSTRUCTION Right 2007  . CHOLECYSTECTOMY     laparoscopic  . COLONOSCOPY     x3  . EYE SURGERY     Left eye 03/02/2018, right 02/15/2018  . HIP ARTHROSCOPY W/ LABRAL REPAIR Right 05/11/2013   acetabular labral tear 03/30/2013  . KNEE ARTHROPLASTY Right   . KNEE JOINT MANIPULATION Left    x3 under anesthesia  . KNEE SURGERY Bilateral 1984   Right ACL, left PCL repair  . LITHOTRIPSY  2005  . LIVER BIOPSY  2013   normal results.  [name] MASTECTOMY Bilateral    prior to 2009  . MOUTH SURGERY    . NASAL SEPTUM SURGERY N/A 09/20/2015   by ENT Dr. [name]  . OVARIAN CYST SURGERY Left    size  of grapefruit, was informed that she had shortened vagina  . SHOULDER SURGERY Bilateral    Right 08/15/2016, Left 11/15/2016  . THUMB ARTHROSCOPY Left   . THYROIDECTOMY, PARTIAL Left 2008  . TOTAL KNEE ARTHROPLASTY Right 08/23/2018   Procedure: TOTAL KNEE ARTHROPLASTY;  Surgeon: [name], MD;  Location: [facility];  Service: Orthopedics;  Laterality: Right;  20mn  . TOTAL KNEE REVISION Left 02/06/2016   Procedure: LEFT TOTAL KNEE REVISION;  Surgeon: FGaynelle Arabian MD;  Location: [facility];  Service: Orthopedics;  Laterality: Left;  . TOTAL KNEE REVISION Left 04/22/2017   Procedure: Left knee polyethylene revision;  Surgeon: AGaynelle Arabian MD;  Location: [facility];  Service: Orthopedics;  Laterality: Left;  . UPPER GI ENDOSCOPY  2003    There were no vitals filed for this visit.  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 PMH: Psoriatic arthritis, CVA, s/p Rt TKR 08/23/18, Lt knee revision 04/2017, lumbar radiculitis, multiple scaphoid fx's Rt thumb    Limitations  fall risk    Patient Stated Goals  cut my food, holding fork and pen/pencil better w/ less pain in Rt hand    Currently in Pain?  Yes    Pain Score  6     Pain Location  Arm   shoulder, elbow, and wrist   Pain Orientation  Right    Pain Descriptors / Indicators  Aching;Sharp;Constant    Pain Type  Chronic pain    Pain Onset  More than a month ago    Pain Frequency  Constant        [facility] OT Assessment - 03/08/19 1243      Assessment   Medical Diagnosis  cervical myelopathy, prior CVA    Referring Provider (OT)  Dr. SNaaman Plummer   Onset Date/Surgical Date  07/05/13    Prior Therapy  previous therapy at this location in 2016, had PT after recent R knee replacement      Precautions   Precautions  Fall    Precaution Comments  all falls have happened while walking with the RW       Restrictions   Weight Bearing Restrictions  No      Balance  Screen  Has the patient fallen in the past 6 months  --   see P.T. eval     Home  Environment   Bathroom Shower/Tub  Walk-in Shower   9" high threshold, grab bars, built in shower chair   Additional Comments  Pt lives in 2 story home w/ bedroom and bathroom on 2nd floor    Lives With  Spouse      Prior Function   Level of Independence  Independent;Other (comment)   doing triatholons prior to 2015   Vocation  On disability    Vocation Requirements  worked prior as a Engineer, mining      ADL   Eating/Feeding  Needs assist with cutting food    Grooming  Modified independent   difficulty shaving   Upper Body Bathing  Modified independent   w/ A/E   Lower Body Bathing  Modified independent   w/ A/E   Upper Body Dressing  Increased time   Can't do buttons   Lower Body Dressing  Needs assist for fasteners;Increased time   wears magnetic belt. leaves shoes tied   Toilet Transfer  Modified independent    Toileting - Clothing Manipulation  Modified independent    Toileting -  Hygiene  --   difficulty w/ hygiene care, uses wipes   Tub/Shower Transfer  Modified independent      IADL   Shopping  Needs to be accompanied on any shopping trip   using powered w/c or scooter if he goes   Meal Prep  Able to complete simple warm meal prep;Able to complete simple cold meal and snack prep   wife does cooking   Medication Management  Takes responsibility if medication is prepared in advance in seperate dosage   difficulty using hands, wife loads     Mobility   Mobility Status  Needs assist    Mobility Status Comments  furniture walks in home, w/c for community      Written Expression   Dominant Hand  Right   but uses Lt often   Handwriting  100% legible   name only in print, writes worse longer he goes     Vision - History   Baseline Vision  Bifocals    Additional Comments  Increased bluriness since Oct 2020. Pt had Rt retinal surgery d/t partial detachment 2019      Cognition    Overall Cognitive Status  History of cognitive impairments - at baseline    Cognition Comments  word finding difficulties, frontal lobe deficits      Observation/Other Assessments   Observations  word finding difficulties. Multiple previous scaphoid fx's Rt hand which causes thumb and wrist pain      Posture/Postural Control   Posture/Postural Control  No significant limitations      Sensation   Light Touch  Impaired by gross assessment   BUE's at fingertips/fingers     Coordination   9 Hole Peg Test  Right;Left    Right 9 Hole Peg Test  30.97 sec    (modifications w/ lateral pinch to take out)    Left 9 Hole Peg Test  31.87 sec.       Edema   Edema  none      AROM   Overall AROM Comments  BUE AROM WFL's however stiff end range shoulders. Pt also w/ decreased finger adduction Lt small finger      Strength   Overall Strength Comments  RUE 3+/5 grossly, LUE 4/5 grossly.  Hand Function   Right Hand Grip (lbs)  18 lbs    Left Hand Grip (lbs)  24 lbs                        OT Short Term Goals - 03/08/19 1530      OT SHORT TERM GOAL #1   Title  Independent with UE strengthening HEP (shoulders and hands)    Time  3    Period  Weeks    Status  New      OT SHORT TERM GOAL #2   Title  Pt to verbalize understanding with potential A/E needs to increase ease and independence with ADLS (specifically cutting food, writing)    Time  3    Period  Weeks    Status  New      OT SHORT TERM GOAL #3   Title  Pt to be assessed for potential splints to help reduce pain Rt thumb and Lt thumb prn    Time  3    Period  Weeks    Status  New        OT Long Term Goals - 03/08/19 1533      OT LONG TERM GOAL #1   Title  Pt to report greater ease with BADLS using A/E and compensatory strategies prn    Time  6    Period  Weeks    Status  New      OT LONG TERM GOAL #2   Title  Pt to improve bilateral grip strength by 5 lbs for gripping activities    Time  6     Period  Weeks    Status  New      OT LONG TERM GOAL #3   Title  Pt to perform snack prep/sandwich prep from standing level w/ rest breaks prn safely w/o LOB    Time  6    Period  Weeks    Status  New            Plan - 03/08/19 1523    Clinical Impression Statement  Pt is a 54 y.o. male who presents to outpatient O.T. with cervical myelopathy, previous stroke(s) in May 2015 w/ mild residual weakness. Pt also has comorbidities of psoriatic arthritis, and lumbar radiculitis, as well as pain from Lt knee revision 04/22/2017 and multiple fx's to Rt scaphoid. Pt was referred to O.T. to work on "UE strength, dexterity, ADLS, and A/E training" and pt would benefit from O.T. to address these deficits and maximize function. However, explained that primary approach is compensatory at this time d/t most deficits since 2015.    Occupational performance deficits (Please refer to evaluation for details):  ADL's;IADL's;Leisure;Social Participation    Body Structure / Function / Physical Skills  ADL;Dexterity;ROM;IADL;Body mechanics;Mobility;Coordination;FMC;Pain;UE functional use;Decreased knowledge of use of DME;Sensation    Rehab Potential  Good   for goals   Clinical Decision Making  Several treatment options, min-mod task modification necessary    Comorbidities Affecting Occupational Performance:  Presence of comorbidities impacting occupational performance    Comorbidities impacting occupational performance description:  psoriatic arthritis, lumbar radiculitis    Modification or Assistance to Complete Evaluation   Min-Moderate modification of tasks or assist with assess necessary to complete eval    OT Frequency  2x / week    OT Duration  6 weeks   plus eval (or 12 visits over extended weeks d/t potential scheduling conflicts)   OT Treatment/Interventions  Self-care/ADL training;Therapeutic exercise;Functional Mobility Training;Aquatic Therapy;Neuromuscular education;Manual  Therapy;Splinting;Therapeutic activities;Coping strategies training;Energy conservation;DME and/or AE instruction;Passive range of motion;Patient/family education;Moist Heat    Plan  pt to bring in splints to assess for thumbs, initiate HEP for UE strength in shoulders and putty HEP (Following session: A/E for cutting food, writing, ADLS)    Consulted and Agree with Plan of Care  Patient       Patient will benefit from skilled therapeutic intervention in order to improve the following deficits and impairments:   Body Structure / Function / Physical Skills: ADL, Dexterity, ROM, IADL, Body mechanics, Mobility, Coordination, FMC, Pain, UE functional use, Decreased knowledge of use of DME, Sensation       Visit Diagnosis: Other symptoms and signs involving the musculoskeletal system  Muscle weakness (generalized)  Other lack of coordination  Unsteadiness on feet  Pain in right hand    Problem List Patient Active Problem List   Diagnosis Date Noted  . Osteoarthritis of right knee 08/23/2018  . Constipation due to opioid therapy 03/30/2018  . Retinopathy of both eyes 01/06/2018  . SNHL (sensorineural hearing loss) 12/04/2017  . Abnormal urinary stream 12/03/2017  . Osteoarthritis of carpometacarpal (CMC) joint of thumb 11/30/2017  . Muscle weakness 11/17/2017  . Transient vision disturbance 11/12/2017  . Bilateral hand pain 10/30/2017  . Pain of left hip joint 10/09/2017  . Gynecomastia 07/10/2017  . Iron deficiency anemia 07/05/2017  . Spasticity 05/20/2017  . Lumbar radiculitis 04/20/2017  . Ulnar 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, left 11/28/2016  . Idiopathic peripheral neuropathy 11/28/2016  . 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 Long term (current) use of anticoagulants 08/23/2015  . Right upper quadrant abdominal pain 08/23/2015  . Memory loss 05/10/2015  . Gait abnormality 04/07/2015  . Alkaline phosphatase elevation 04/07/2015  . History of thrombosis  03/26/2015  . Medial epicondylitis 02/07/2015  . Cognitive decline 12/21/2014  . Leukopenia 12/05/2014  . Rotator cuff syndrome of right shoulder 10/27/2014  . Status post left knee replacement 08/22/2014  . Left lateral epicondylitis 08/22/2014  . Chronic cerebral ischemia 08/18/2014  . Arthrofibrosis of knee joint 08/17/2014  . Cubital canal compression syndrome, left 08/17/2014  . 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 Chronic pain syndrome 04/10/2014  . Insomnia 04/10/2014  . Chronic non-specific white matter lesions on MRI 04/10/2014  . CFS (chronic fatigue syndrome) 04/10/2014  . 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 Biceps tendonitis on left 03/01/2014  . Polycythemia vera ([place]) 12/27/2013  . H/O TIA (transient ischemic attack) and stroke 12/27/2013  . Neck pain 12/27/2013  . OSA (obstructive sleep apnea) 12/08/2013  . 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 Depression with anxiety 08/04/2013  . Protein C deficiency ([place]) 08/01/2013  . Post traumatic stress disorder (PTSD) 08/01/2013  . Speech abnormality 07/25/2013  . Obesity 05/10/2013  . Lower extremity edema 05/10/2013  . GERD (gastroesophageal reflux disease) 05/10/2013  . Arthritis 05/10/2013  . OA (osteoarthritis) of knee 03/15/2013  . Headache 10/25/2012  . Palpitations 10/18/2012  . Fatty liver determined by biopsy 06/01/2012  . Arthropathic psoriasis, unspecified ([place]) 04/29/2012  . Abnormal liver enzymes 03/29/2012  . Psoriatic arthritis ([place]) 12/29/2011  . Left Renal Hydronephrosis 12/11/2010  . 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 Celiac disease 05/27/2010  . Thyroid nodule 05/27/2010  . Male-to-male transgender person 09/20/2002    Carey Bullocks, OTR/L 03/08/2019, 3:34 PM  [facility] [address] [place], [place], [address] Phone: [phone]  Fax:  [phone]  Name: Jafet Wissing MRN:  [id number] Date of Birth: [birth year]

## 2019-03-09 ENCOUNTER — Ambulatory Visit: Payer: BC Managed Care – PPO | Admitting: Occupational Therapy

## 2019-03-09 NOTE — Therapy (Signed)
[facility] [address] [place], [place], [address] Phone: [phone]   Fax:  [phone]  Physical Therapy Evaluation  Patient Details  Name: Jesse Bowers MRN: [id number] Date of Birth: 1965-03-25 Referring Provider (PT): Alger Simons, MD   Encounter Date: 03/08/2019  PT End of Session - 03/09/19 1245    Visit Number  1    Number of Visits  13    Date for PT Re-Evaluation  05/08/19    Authorization Type  BC/BS primary, MCR secondary (however may lose part B) - 30 visit limit combined PT/OT (Same day counts as 1 visit)    Authorization - Visit Number  1    Authorization - Number of Visits  30    PT Start Time  1147    PT Stop Time  1232    PT Time Calculation (min)  45 min    Equipment Utilized During Treatment  Gait belt    Activity Tolerance  Patient tolerated treatment well   verbose   Behavior During Therapy  WFL for tasks assessed/performed       Past Medical History:  Diagnosis Date  . Abnormal weight loss   . Anxiety   . Arthritis   . Cataract    OU  . Celiac disease   . Cervical neck pain with evidence of disc disease    patient has a cyst   . Chronic constipation   . Chronic diastolic heart failure ([facility])    Pt. denies  . Chronic pain   . Degenerative disc disease at L5-S1 level    with stenosis  . DVT (deep venous thrombosis) (HCC)    Right upper arm, bilateral leg  . Eczema    inguinal, feet  . Elevated liver enzymes   . 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 Family history of adverse reaction to anesthesia    family has problems with anesthesia of nausea and vomiting   . 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 GERD (gastroesophageal reflux disease)    History of in 20's  . Gluten enteropathy   . H/O parotitis    right   . Hard of hearing   . History of kidney stones   . History of retinal tear    Bilateral  . History of staph infection    required wound vac  . Hx-TIA  (transient ischemic attack)    2015  . LVH (left ventricular hypertrophy) 12/15/2016   Mild, noted on ECHO  . MVP (mitral valve prolapse)   . NAFL (nonalcoholic fatty liver)   . Neck pain   . Neuromuscular disorder ([name])    bilateral neuropathy feet.  . Pneumonia 12/17/2010  . Polycythemia   . Polycythemia, secondary   . PONV (postoperative nausea and vomiting)   . Protein C deficiency ([place])    Dr. Anne Fu  . Psoriasis    16 X10 cm psoriatic rash on sole of left foot ; open and occ scant bleeding;   . psoriatic arthritis   . PTSD (post-traumatic stress disorder)   . Scaphoid fracture of wrist 09/23/2013  . Seizure ([name])    childhood, medication until age 11 then weanned completely off  . Sleep apnea    split night study last done by Dr. [name] 06/18/15 shows severe OSA, CSA, and hypersomnia, rec bipap  . Splenomegaly   . Stenosis of ureteropelvic junction (UPJ)    left  . Stroke [name])    CVA vs TIA in left  cerebrum causing slight right sided weakness-Dr. [name] follows  . Syrinx of spinal cord ([name]) 01/06/2014   c spine on MRI  . Tachycardia    hx of   . Transfusion history    past history- none recent, after surgeries due to blood loss  . Wears glasses   . Wears hearing aid     Past Surgical History:  Procedure Laterality Date  . ABDOMINAL HYSTERECTOMY Bilateral 1994   TAH, BSO- tranverse incision at 54 yo  . ANKLE ARTHROSCOPY WITH RECONSTRUCTION Right 2007  . CHOLECYSTECTOMY     laparoscopic  . COLONOSCOPY     x3  . EYE SURGERY     Left eye 03/02/2018, right 02/15/2018  . HIP ARTHROSCOPY W/ LABRAL REPAIR Right 05/11/2013   acetabular labral tear 03/30/2013  . KNEE ARTHROPLASTY Right   . KNEE JOINT MANIPULATION Left    x3 under anesthesia  . KNEE SURGERY Bilateral 1984   Right ACL, left PCL repair  . LITHOTRIPSY  2005  . LIVER BIOPSY  2013   normal results.  [name] MASTECTOMY Bilateral    prior to 2009  . MOUTH SURGERY    . NASAL SEPTUM SURGERY N/A  09/20/2015   by ENT Dr. [name]  . OVARIAN CYST SURGERY Left    size of grapefruit, was informed that she had shortened vagina  . SHOULDER SURGERY Bilateral    Right 08/15/2016, Left 11/15/2016  . THUMB ARTHROSCOPY Left   . THYROIDECTOMY, PARTIAL Left 2008  . TOTAL KNEE ARTHROPLASTY Right 08/23/2018   Procedure: TOTAL KNEE ARTHROPLASTY;  Surgeon: [name], MD;  Location: [facility];  Service: Orthopedics;  Laterality: Right;  99mn  . TOTAL KNEE REVISION Left 02/06/2016   Procedure: LEFT TOTAL KNEE REVISION;  Surgeon: FGaynelle Arabian MD;  Location: [facility];  Service: Orthopedics;  Laterality: Left;  . TOTAL KNEE REVISION Left 04/22/2017   Procedure: Left knee polyethylene revision;  Surgeon: AGaynelle Arabian MD;  Location: [facility];  Service: Orthopedics;  Laterality: Left;  . UPPER GI ENDOSCOPY  2003    There were no vitals filed for this visit.   Subjective Assessment - 03/08/19 1153    Subjective  Was referred by Dr. SNaaman Plummer Keeps having orthopedic surgeries (had R knee replaced in 08/2018 and then received PT). States that weight bearing through his joints was bothersome and is referred for aquatic therapy. Has previously done aquatic therapy from hip surgery (2012). Prior left sided CVA of L frontal lobe in May 2015 with residual right sided weakness. Lost his hearing in 2019. Got a manual w/c in 2016 and got his power w/c in 2018. Still is able to walk but gets progressively weaker throughout the day. Uses the RW at night and smaller distances. Had a fall the other week, was getting up at night and left leg just gave out.    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 PMH: Psoriatic arthritis, Polycythemia vera, CVA, s/p Rt TKR 08/23/18, Lt knee revision 04/2017, lumbar radiculitis, multiple scaphoid fx's Rt thumb, chronic pain    How long can you walk comfortably?  can walk around the house - furniture walking    Diagnostic tests  MRI cervical spine  07/2017: Unchanged C4 through C6 cervical spinal cord syrinx since the MRIin March 2016, and stable by report since October 2015., MRI brain 2019: No acute intracranial abnormality and stable noncontrast MRI appearance of the brain since 2017.    Patient Stated Goals  can't  get up without using his hands, wants to be able to get out of his chair without using his hands - less pain in legs and back, be stronger.    Currently in Pain?  Yes    Pain Score  5     Pain Location  Back   and knees   Pain Orientation  Right;Left    Pain Descriptors / Indicators  Aching    Pain Type  Chronic pain    Aggravating Factors   states that he gets shooting pains down his legs when he walks.    Pain Relieving Factors  not moving.         [facility] PT Assessment - 03/08/19 1210      Assessment   Medical Diagnosis  cervical myelopathy, prior CVA    Referring Provider (PT)  Clerance Lav, MD    Onset Date/Surgical Date  07/05/13    Hand Dominance  --   ambidexturous   Prior Therapy  previous therapy at this location in 2016, had PT after recent R knee replacement      Precautions   Precautions  Fall    Precaution Comments  all falls have happened while walking with the RW       Balance Screen   Has the patient fallen in the past 6 months  Yes    How many times?  --   falls probably about 2x a month or catches himself    Has the patient had a decrease in activity level because of a fear of falling?   Yes    Is the patient reluctant to leave their home because of a fear of falling?   Yes      [place] residence    Living Arrangements  Spouse/significant other;Other (Comment)   and daughter   Type of [place] entrance    Home Layout  Two level    Alternate Level Stairs-Number of Steps  4 and a landing, and then 13 straight up, can't live on the ground floor     Alternate Level Stairs-Rails  Right   and uses the wall    Home Equipment   Wheelchair - power;Walker - standard;Wheelchair - Education administrator (comment);Bedside commode;Shower seat - built in   one RW on first floor and one on 2nd    Additional Comments  does not have a ramp on the back door, step is 13 inches      Prior Function   Level of Independence  Independent;Other (comment)   doing triatholons prior to 2015    Vocation Requirements  worked prior as a Engineer, mining      Observation/Other Assessments   Observations  B genu knee valgum in sitting       Sensation   Light Touch  Impaired by gross assessment;Impaired Detail    Light Touch Impaired Details  Impaired RLE;Impaired LLE    Additional Comments  reports B legs go numb at night, sitting here have numbness in posterior thigh and the sides. more difficulty with light touch LLE more distally than proxmimally        Coordination   Gross Motor Movements are Fluid and Coordinated  No    Heel Shin Test  dysmetric, limited by weakness    9 Hole Peg Test         ROM / Strength   AROM / PROM / Strength  Strength  Strength   Strength Assessment Site  Knee;Ankle;Hip    Right/Left Hip  Right;Left    Right Hip Flexion  3-/5    Left Hip Flexion  4-/5    Right/Left Knee  Right;Left    Right Knee Flexion  3+/5    Right Knee Extension  3+/5    Left Knee Flexion  3+/5    Left Knee Extension  3+/5    Right/Left Ankle  Right;Left    Right Ankle Dorsiflexion  4/5    Left Ankle Dorsiflexion  4-/5      Transfers   Transfers  Sit to Stand;Stand to Sit;Stand Pivot Transfers    Sit to Stand  5: Supervision    Sit to Stand Details  Verbal cues for sequencing;Verbal cues for technique    Sit to Stand Details (indicate cue type and reason)  from elevated mat table, use of single UE to press to stand, in standing initially braces BLE against the back of the mat     Stand to Sit  5: Supervision    Stand to Sit Details (indicate cue type and reason)  Verbal cues for technique;Verbal cues for sequencing    Stand to  Sit Details  from elevated mat table, uses UE and BLEs to control descent    Comments  Stand step transfer with RW: 5 steps from mat table to power w/c with min guard       Ambulation/Gait   Gait Comments  unable to assess today secondary to time Pharmacist, [employer]  Independent                Objective measurements completed on examination: See above findings.              PT Education - 03/09/19 1244    Education Details  clinical findings, POC    Person(s) Educated  Patient    Methods  Explanation    Comprehension  Verbalized understanding       PT Short Term Goals - 03/09/19 1302      PT SHORT TERM GOAL #1   Title  Pt will be independent with initial HEP for improved strength, balance, and gait. ALL STGS DUE 04/06/19    Time  4   due to delay in scheduling   Period  Weeks    Status  New    Target Date  04/06/19      PT SHORT TERM GOAL #2   Title  Patient will undergo further assessment of TUG with use of RW vs. BERG  in order to determine fall risk - goal written as appropriate.    Baseline  not yet assessed.    Time  4    Period  Weeks    Status  New      PT SHORT TERM GOAL #3   Title  Patient will verbalize understanding of fall prevention strategies in the home.    Time  4    Period  Weeks    Status  New      PT SHORT TERM GOAL #4   Title  Patient will ambulate at least 15' with RW with supervision in order to safely ambulate at home.    Baseline  not yet assessed.    Time  4    Period  Weeks    Status  New  PT SHORT TERM GOAL #5   Title  Patient will perform 4 steps with supervision using single railing and step to pattern in order to increase safety at home with stairs.    Baseline  not yet assessed.    Time  4    Period  Weeks    Status  New      Additional Short Term Goals   Additional Short Term Goals  Yes      PT  SHORT TERM GOAL #6   Title  Perform 5x sit to stand from standard arm chair vs. mat table and write goal as appropriate.    Time  4    Period  Weeks    Status  New        PT Long Term Goals - 03/09/19 1306      PT LONG TERM GOAL #1   Title  Pt will be independent with final HEP for land/aquatic therapy for improved strength, ROM, balance, and gait. ALL LTGS DUE 05/04/19    Time  8   due to delay in scheduling   Period  Weeks    Status  New    Target Date  05/04/19      PT LONG TERM GOAL #2   Title  BERG vs. TUG score to be written as appropriate to determine fall risk.    Time  8    Period  Weeks    Status  New      PT LONG TERM GOAL #3   Title  Patient will ambulate at least 100' with RW with mod I in order to safely ambulate at home.    Baseline  not yet assessed.    Time  8    Period  Weeks    Status  New      PT LONG TERM GOAL #4   Title  Patient will perform 12 steps with supervision using single railing and step to pattern in order to increase safety at home with stairs.    Baseline  not yet assessed.    Time  8    Period  Weeks    Status  New      PT LONG TERM GOAL #5   Title  Patient will perform bed mobility, stap step transfers with RW with mod I in order to decrease caregiver burden.    Baseline  not yet assessed.    Time  8    Period  Weeks    Status  New      PT LONG TERM GOAL #6   Title  5x sit <> stand goal to be written as appropriate.    Time  8    Period  Weeks    Status  New               03/09/19 1248  Plan  Clinical Impression Statement Patient is a 54 year old referred to Neuro OPPT for evaluation with primary concern of cervical myelopathy, prior CVA (2015) with residual right hemiparesis. .  Pt's PMH is significant for: Psoriatic arthritis, Polycythemia vera, CVA, s/p Rt TKR 08/23/18, Lt knee revision 04/2017, lumbar radiculitis, multiple scaphoid fx's Rt thumb, chronic pain. The following deficits were present during the exam: low  back and bilateral knee pain, decreased LE strength, impaired sensation, decreased LE ROM, decreased standing balance. Pt states that he ambulates small distances with a RW at home (unable to assess today due to time constraints). Pt would benefit from skilled PT  to address these impairments and functional limitations to maximize functional mobility independence and decrease risk of falls.  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 PMH: Psoriatic arthritis, Polycythemia vera, CVA, s/p Rt TKR 08/23/18, Lt knee revision 04/2017, lumbar radiculitis, multiple scaphoid fx's Rt thumb, chronic pain  Examination-Activity Limitations Transfers;Stairs;Locomotion Level;Stand  Examination-Participation Restrictions Community Activity  Pt will benefit from skilled therapeutic intervention in order to improve on the following deficits Decreased range of motion;Decreased endurance;Decreased activity tolerance;Abnormal gait;Difficulty walking;Decreased coordination;Decreased balance;Decreased strength;Pain;Decreased mobility;Decreased safety awareness  Stability/Clinical Decision Making Evolving/Moderate complexity  Clinical Decision Making Moderate  Rehab Potential Good  PT Frequency 2x / week  PT Duration 6 weeks  PT Treatment/Interventions ADLs/Self Care Home Management;Neuromuscular re-education;Therapeutic activities;Therapeutic exercise;Patient/family education;Manual techniques;Scar mobilization;Passive range of motion;Manual lymph drainage;Electrical Stimulation;Aquatic Therapy;Energy conservation;Gait training;Stair training;Functional mobility training;DME Instruction;Balance training;Orthotic Fit/Training  PT Next Visit Plan assess gait and stair training. initial HEP for strengthening. assess bed mobility. get aquatic therapy set up.  Consulted and Agree with Plan of Care Patient    Patient will benefit from  skilled therapeutic intervention in order to improve the following deficits and impairments:  Decreased range of motion, Decreased endurance, Decreased activity tolerance, Abnormal gait, Difficulty walking, Decreased coordination, Decreased balance, Decreased strength, Pain, Decreased mobility, Decreased safety awareness  Visit Diagnosis: Other symptoms and signs involving the musculoskeletal system  Other abnormalities of gait and mobility  Chronic pain of left knee  Chronic pain of right knee  Unsteadiness on feet  Other symptoms and signs involving the nervous system     Problem List Patient Active Problem List   Diagnosis Date Noted  . Osteoarthritis of right knee 08/23/2018  . Constipation due to opioid therapy 03/30/2018  . Retinopathy of both eyes 01/06/2018  . SNHL (sensorineural hearing loss) 12/04/2017  . Abnormal urinary stream 12/03/2017  . Osteoarthritis of carpometacarpal (CMC) joint of thumb 11/30/2017  . Muscle weakness 11/17/2017  . Transient vision disturbance 11/12/2017  . Bilateral hand pain 10/30/2017  . Pain of left hip joint 10/09/2017  . Gynecomastia 07/10/2017  . Iron deficiency anemia 07/05/2017  . Spasticity 05/20/2017  . Lumbar radiculitis 04/20/2017  . Ulnar 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, left 11/28/2016  . Idiopathic peripheral neuropathy 11/28/2016  . 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 Long term (current) use of anticoagulants 08/23/2015  . Right upper quadrant abdominal pain 08/23/2015  . Memory loss 05/10/2015  . Gait abnormality 04/07/2015  . Alkaline phosphatase elevation 04/07/2015  . History of thrombosis 03/26/2015  . Medial epicondylitis 02/07/2015  . Cognitive decline 12/21/2014  . Leukopenia 12/05/2014  . Rotator cuff syndrome of right shoulder 10/27/2014  . Status post left knee replacement 08/22/2014  . Left lateral epicondylitis 08/22/2014  . Chronic cerebral ischemia 08/18/2014  . Arthrofibrosis of knee joint  08/17/2014  . Cubital canal compression syndrome, left 08/17/2014  . 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 Chronic pain syndrome 04/10/2014  . Insomnia 04/10/2014  . Chronic non-specific white matter lesions on MRI 04/10/2014  . CFS (chronic fatigue syndrome) 04/10/2014  . 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 Biceps tendonitis on left 03/01/2014  . Polycythemia vera ([place]) 12/27/2013  . H/O TIA (transient ischemic attack) and stroke 12/27/2013  . Neck pain 12/27/2013  . OSA (obstructive sleep apnea) 12/08/2013  . 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 Depression with anxiety 08/04/2013  . Protein C deficiency ([place]) 08/01/2013  . Post traumatic stress disorder (PTSD) 08/01/2013  .  Speech abnormality 07/25/2013  . Obesity 05/10/2013  . Lower extremity edema 05/10/2013  . GERD (gastroesophageal reflux disease) 05/10/2013  . Arthritis 05/10/2013  . OA (osteoarthritis) of knee 03/15/2013  . Headache 10/25/2012  . Palpitations 10/18/2012  . Fatty liver determined by biopsy 06/01/2012  . Arthropathic psoriasis, unspecified ([place]) 04/29/2012  . Abnormal liver enzymes 03/29/2012  . Psoriatic arthritis ([place]) 12/29/2011  . Left Renal Hydronephrosis 12/11/2010  . 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 Celiac disease 05/27/2010  . Thyroid nodule 05/27/2010  . Male-to-male transgender person 09/20/2002    Arliss Journey, PT, DPT  03/09/2019, 1:13 PM  [facility] [address] [place], [place], [address] Phone: [phone]   Fax:  [phone]  Name: Edem Tiegs MRN: [id number] Date of Birth: 11-Jul-1965

## 2019-03-10 ENCOUNTER — Ambulatory Visit (INDEPENDENT_AMBULATORY_CARE_PROVIDER_SITE_OTHER): Payer: BC Managed Care – PPO | Admitting: Psychology

## 2019-03-10 DIAGNOSIS — F4323 Adjustment disorder with mixed anxiety and depressed mood: Secondary | ICD-10-CM | POA: Diagnosis not present

## 2019-03-11 ENCOUNTER — Inpatient Hospital Stay: Payer: BC Managed Care – PPO

## 2019-03-11 ENCOUNTER — Ambulatory Visit: Payer: BC Managed Care – PPO | Admitting: Physical Therapy

## 2019-03-11 ENCOUNTER — Inpatient Hospital Stay: Payer: BC Managed Care – PPO | Attending: Hematology

## 2019-03-11 ENCOUNTER — Encounter: Payer: Self-pay | Admitting: Physical Therapy

## 2019-03-11 ENCOUNTER — Other Ambulatory Visit: Payer: Self-pay

## 2019-03-11 DIAGNOSIS — R2681 Unsteadiness on feet: Secondary | ICD-10-CM

## 2019-03-11 DIAGNOSIS — R29898 Other symptoms and signs involving the musculoskeletal system: Secondary | ICD-10-CM | POA: Diagnosis not present

## 2019-03-11 DIAGNOSIS — R278 Other lack of coordination: Secondary | ICD-10-CM

## 2019-03-11 DIAGNOSIS — D751 Secondary polycythemia: Secondary | ICD-10-CM

## 2019-03-11 DIAGNOSIS — M6281 Muscle weakness (generalized): Secondary | ICD-10-CM

## 2019-03-11 LAB — CBC WITH DIFFERENTIAL/PLATELET
Abs Immature Granulocytes: 0.01 10*3/uL (ref 0.00–0.07)
Basophils Absolute: 0 10*3/uL (ref 0.0–0.1)
Basophils Relative: 0 %
Eosinophils Absolute: 0 10*3/uL (ref 0.0–0.5)
Eosinophils Relative: 0 %
HCT: 44.8 % (ref 39.0–52.0)
Hemoglobin: 13.6 g/dL (ref 13.0–17.0)
Immature Granulocytes: 0 %
Lymphocytes Relative: 25 %
Lymphs Abs: 1.2 10*3/uL (ref 0.7–4.0)
MCH: 23.9 pg — ABNORMAL LOW (ref 26.0–34.0)
MCHC: 30.4 g/dL (ref 30.0–36.0)
MCV: 78.7 fL — ABNORMAL LOW (ref 80.0–100.0)
Monocytes Absolute: 0.5 10*3/uL (ref 0.1–1.0)
Monocytes Relative: 10 %
Neutro Abs: 3.1 10*3/uL (ref 1.7–7.7)
Neutrophils Relative %: 65 %
Platelets: 232 10*3/uL (ref 150–400)
RBC: 5.69 MIL/uL (ref 4.22–5.81)
RDW: 15.4 % (ref 11.5–15.5)
WBC: 4.7 10*3/uL (ref 4.0–10.5)
nRBC: 0 % (ref 0.0–0.2)

## 2019-03-11 NOTE — Progress Notes (Signed)
Hct 44.8 today. Pt elected to have phlebotomy due to symptoms. L AC accessed. 512cc blood collected. Pt tolerated well. Food and beverage offered and accepted. Will monitor for 46mn. And collect VS before DC.

## 2019-03-11 NOTE — Patient Instructions (Addendum)
Access Code: [email or web address]  URL: [email or web address]  Date: 03/11/2019  Prepared by: Janann August   Exercises Supine Bridge - 10 reps - 2 sets - 2x daily - 5-6x weekly Clamshell - 10 reps - 2 sets - 2x daily - 5-6x weekly Sidelying Hip Abduction - 10 reps - 2 sets - 2x daily - 5-6x weekly Supine Active Straight Leg Raise - 10 reps - 2 sets - 2x daily - 5-6x weekly Seated Long Arc Quad - 10 reps - 2 sets - 2x daily - 5-6x weekly Seated Heel Toe Raises - 10 reps - 2 sets - 2x daily - 5-6x weekly Supine March - 10 reps - 2 sets - 2x daily - 5-6x weekly

## 2019-03-11 NOTE — Therapy (Signed)
[facility] [address] [place], [place], [address] Phone: [phone]   Fax:  [phone]  Physical Therapy Treatment  Patient Details  Name: Jesse Bowers MRN: [id number] Date of Birth: 1965-04-07 Referring Provider (PT): Clerance Lav, MD   Encounter Date: 03/11/2019  PT End of Session - 03/11/19 1503    Visit Number  2    Number of Visits  13    Date for PT Re-Evaluation  05/08/19    Authorization Type  BC/BS primary, MCR secondary (however may lose part B) - 30 visit limit combined PT/OT (Same day counts as 1 visit)    Authorization - Visit Number  2    Authorization - Number of Visits  30    PT Start Time  6962   pt arrived late to appointment   PT Stop Time  1401    PT Time Calculation (min)  33 min    Activity Tolerance  Patient tolerated treatment well   verbose   Behavior During Therapy  [facility] for tasks assessed/performed       Past Medical History:  Diagnosis Date  . Abnormal weight loss   . Anxiety   . Arthritis   . Cataract    OU  . Celiac disease   . Cervical neck pain with evidence of disc disease    patient has a cyst   . Chronic constipation   . Chronic diastolic heart failure ([place])    Pt. denies  . Chronic pain   . Degenerative disc disease at L5-S1 level    with stenosis  . DVT (deep venous thrombosis) (H[facility])    Right upper arm, bilateral leg  . Eczema    inguinal, feet  . Elevated liver enzymes   . 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 Family history of adverse reaction to anesthesia    family has problems with anesthesia of nausea and vomiting   . 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 GERD (gastroesophageal reflux disease)    History of in 20's  . Gluten enteropathy   . H/O parotitis    right   . Hard of hearing   . History of kidney stones   . History of retinal tear    Bilateral  . History of staph infection    required wound vac  . Hx-TIA (transient ischemic  attack)    2015  . LVH (left ventricular hypertrophy) 12/15/2016   Mild, noted on ECHO  . MVP (mitral valve prolapse)   . NAFL (nonalcoholic fatty liver)   . Neck pain   . Neuromuscular disorder ([place])    bilateral neuropathy feet.  . Pneumonia 12/17/2010  . Polycythemia   . Polycythemia, secondary   . PONV (postoperative nausea and vomiting)   . Protein C deficiency ([place])    Dr. Anne Fu  . Psoriasis    16 X10 cm psoriatic rash on sole of left foot ; open and occ scant bleeding;   . psoriatic arthritis   . PTSD (post-traumatic stress disorder)   . Scaphoid fracture of wrist 09/23/2013  . Seizure ([place])    childhood, medication until age 77 then weanned completely off  . Sleep apnea    split night study last done by Dr. [name] 06/18/15 shows severe OSA, CSA, and hypersomnia, rec bipap  . Splenomegaly   . Stenosis of ureteropelvic junction (UPJ)    left  . Stroke [facility])    CVA vs TIA in left cerebrum causing slight right  sided weakness-Dr. [name] follows  . Syrinx of spinal cord ([place]) 01/06/2014   c spine on MRI  . Tachycardia    hx of   . Transfusion history    past history- none recent, after surgeries due to blood loss  . Wears glasses   . Wears hearing aid     Past Surgical History:  Procedure Laterality Date  . ABDOMINAL HYSTERECTOMY Bilateral 1994   TAH, BSO- tranverse incision at 54 yo  . ANKLE ARTHROSCOPY WITH RECONSTRUCTION Right 2007  . CHOLECYSTECTOMY     laparoscopic  . COLONOSCOPY     x3  . EYE SURGERY     Left eye 03/02/2018, right 02/15/2018  . HIP ARTHROSCOPY W/ LABRAL REPAIR Right 05/11/2013   acetabular labral tear 03/30/2013  . KNEE ARTHROPLASTY Right   . KNEE JOINT MANIPULATION Left    x3 under anesthesia  . KNEE SURGERY Bilateral 1984   Right ACL, left PCL repair  . LITHOTRIPSY  2005  . LIVER BIOPSY  2013   normal results.  [name] MASTECTOMY Bilateral    prior to 2009  . MOUTH SURGERY    . NASAL SEPTUM SURGERY N/A 09/20/2015   by ENT Dr.  [name]  . OVARIAN CYST SURGERY Left    size of grapefruit, was informed that she had shortened vagina  . SHOULDER SURGERY Bilateral    Right 08/15/2016, Left 11/15/2016  . THUMB ARTHROSCOPY Left   . THYROIDECTOMY, PARTIAL Left 2008  . TOTAL KNEE ARTHROPLASTY Right 08/23/2018   Procedure: TOTAL KNEE ARTHROPLASTY;  Surgeon: [name], MD;  Location: [facility];  Service: Orthopedics;  Laterality: Right;  46mn  . TOTAL KNEE REVISION Left 02/06/2016   Procedure: LEFT TOTAL KNEE REVISION;  Surgeon: FGaynelle Arabian MD;  Location: [facility];  Service: Orthopedics;  Laterality: Left;  . TOTAL KNEE REVISION Left 04/22/2017   Procedure: Left knee polyethylene revision;  Surgeon: AGaynelle Arabian MD;  Location: [facility];  Service: Orthopedics;  Laterality: Left;  . UPPER GI ENDOSCOPY  2003    There were no vitals filed for this visit.  Subjective Assessment - 03/11/19 1332    Subjective  No falls or changes since the other day.    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 PMH: Psoriatic arthritis, Polycythemia vera, CVA, s/p Rt TKR 08/23/18, Lt knee revision 04/2017, lumbar radiculitis, multiple scaphoid fx's Rt thumb, chronic pain    How long can you walk comfortably?  can walk around the house - furniture walking    Diagnostic tests  MRI cervical spine 07/2017: Unchanged C4 through C6 cervical spinal cord syrinx since the MRIin March 2016, and stable by report since October 2015., MRI brain 2019: No acute intracranial abnormality and stable noncontrast MRI appearance of the brain since 2017.    Patient Stated Goals  can't get up without using his hands, wants to be able to get out of his chair without using his hands - less pain in legs and back, be stronger.    Currently in Pain?  Yes    Pain Score  6     Pain Location  Knee   left knee   Pain Orientation  Left    Pain Descriptors / Indicators  Aching;Sharp;Constant                        Access Code: [email or web address] URL: [email or web address]  Date: 03/11/2019  Prepared by: CJanann August  Initiated pt's  HEP: cues provided for technique and for core activation to protect low back.    Exercises Supine Bridge - 10 reps - 2 sets - 2x daily - 5-6x weekly  Clamshell - 10 reps - 2 sets - 2x daily - 5-6x weekly Sidelying Hip Abduction - 10 reps - 2 sets - 2x daily - 5-6x weekly Supine Active Straight Leg Raise - 10 reps - 2 sets - 2x daily - 5-6x weekly - cues to perform quad set first and to lower leg back down before lifting back up.  Seated Long Arc Quad - 10 reps - 2 sets - 2x daily - 5-6x weekly Seated Heel Toe Raises - 10 reps - 2 sets - 2x daily - 5-6x weekly Supine March - 10 reps - 2 sets - 2x daily - 5-6x weekly   [facility] Adult PT Treatment/Exercise - 03/11/19 0001      Bed Mobility   Bed Mobility  Supine to Sit    Supine to Sit  Supervision/Verbal cueing   educated pt on proper log roll technique      Transfers   Transfers  Stand Pivot Transfers    Stand Pivot Transfers  5: Supervision;4: Min guard    Stand Pivot Transfer Details (indicate cue type and reason)  from mat table <> w/c              PT Education - 03/11/19 1502    Education Details  initial HEP    Person(s) Educated  Patient    Methods  Explanation;Demonstration;Handout    Comprehension  Verbalized understanding;Returned demonstration;Verbal cues required;Tactile cues required       PT Short Term Goals - 03/09/19 1302      PT SHORT TERM GOAL #1   Title  Pt will be independent with initial HEP for improved strength, balance, and gait. ALL STGS DUE 04/06/19    Time  4   due to delay in scheduling   Period  Weeks    Status  New    Target Date  04/06/19      PT SHORT TERM GOAL #2   Title  Patient will undergo further assessment of TUG with use of RW vs. BERG  in order to determine fall risk - goal written as appropriate.    Baseline  not yet assessed.    Time  4    Period   Weeks    Status  New      PT SHORT TERM GOAL #3   Title  Patient will verbalize understanding of fall prevention strategies in the home.    Time  4    Period  Weeks    Status  New      PT SHORT TERM GOAL #4   Title  Patient will ambulate at least 75' with RW with supervision in order to safely ambulate at home.    Baseline  not yet assessed.    Time  4    Period  Weeks    Status  New      PT SHORT TERM GOAL #5   Title  Patient will perform 4 steps with supervision using single railing and step to pattern in order to increase safety at home with stairs.    Baseline  not yet assessed.    Time  4    Period  Weeks    Status  New      Additional Short Term Goals   Additional Short Term Goals  Yes  PT SHORT TERM GOAL #6   Title  Perform 5x sit to stand from standard arm chair vs. mat table and write goal as appropriate.    Time  4    Period  Weeks    Status  New        PT Long Term Goals - 03/09/19 1306      PT LONG TERM GOAL #1   Title  Pt will be independent with final HEP for land/aquatic therapy for improved strength, ROM, balance, and gait. ALL LTGS DUE 05/04/19    Time  8   due to delay in scheduling   Period  Weeks    Status  New    Target Date  05/04/19      PT LONG TERM GOAL #2   Title  BERG vs. TUG score to be written as appropriate to determine fall risk.    Time  8    Period  Weeks    Status  New      PT LONG TERM GOAL #3   Title  Patient will ambulate at least 100' with RW with mod I in order to safely ambulate at home.    Baseline  not yet assessed.    Time  8    Period  Weeks    Status  New      PT LONG TERM GOAL #4   Title  Patient will perform 12 steps with supervision using single railing and step to pattern in order to increase safety at home with stairs.    Baseline  not yet assessed.    Time  8    Period  Weeks    Status  New      PT LONG TERM GOAL #5   Title  Patient will perform bed mobility, stap step transfers with RW with mod I  in order to decrease caregiver burden.    Baseline  not yet assessed.    Time  8    Period  Weeks    Status  New      PT LONG TERM GOAL #6   Title  5x sit <> stand goal to be written as appropriate.    Time  8    Period  Weeks    Status  New            Plan - 03/11/19 1507    Clinical Impression Statement  Focus of today's skilled session was administering initial HEP for LE strengthening in supine and seated positions. Session limited today due to pt arriving late. Pt needing initial cueing to perform all exercises - able to demonstrate all with proper technique. Will continue to progress towards LTGs.    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 PMH: Psoriatic arthritis, Polycythemia vera, CVA, s/p Rt TKR 08/23/18, Lt knee revision 04/2017, lumbar radiculitis, multiple scaphoid fx's Rt thumb, chronic pain    Examination-Activity Limitations  Transfers;Stairs;Locomotion Level;Stand    Examination-Participation Restrictions  Community Activity    Stability/Clinical Decision Making  Evolving/Moderate complexity    Rehab Potential  Good    PT Frequency  2x / week    PT Duration  6 weeks    PT Treatment/Interventions  ADLs/Self Care Home Management;Neuromuscular re-education;Therapeutic activities;Therapeutic exercise;Patient/family education;Manual techniques;Scar mobilization;Passive range of motion;Manual lymph drainage;Electrical Stimulation;Aquatic Therapy;Energy conservation;Gait training;Stair training;Functional mobility training;DME Instruction;Balance training;Orthotic Fit/Training    PT Next Visit Plan  assess gait and  stair training (pt going to bring in his braces again next week). initial HEP for strengthening. assess bed mobility. get aquatic therapy set up.    PT Home Exercise Plan  [email or web address]    Consulted and Agree with Plan of Care  Patient       Patient will benefit from  skilled therapeutic intervention in order to improve the following deficits and impairments:  Decreased range of motion, Decreased endurance, Decreased activity tolerance, Abnormal gait, Difficulty walking, Decreased coordination, Decreased balance, Decreased strength, Pain, Decreased mobility, Decreased safety awareness  Visit Diagnosis: Muscle weakness (generalized)  Other lack of coordination  Unsteadiness on feet     Problem List Patient Active Problem List   Diagnosis Date Noted  . Osteoarthritis of right knee 08/23/2018  . Constipation due to opioid therapy 03/30/2018  . Retinopathy of both eyes 01/06/2018  . SNHL (sensorineural hearing loss) 12/04/2017  . Abnormal urinary stream 12/03/2017  . Osteoarthritis of carpometacarpal (CMC) joint of thumb 11/30/2017  . Muscle weakness 11/17/2017  . Transient vision disturbance 11/12/2017  . Bilateral hand pain 10/30/2017  . Pain of left hip joint 10/09/2017  . Gynecomastia 07/10/2017  . Iron deficiency anemia 07/05/2017  . Spasticity 05/20/2017  . Lumbar radiculitis 04/20/2017  . Ulnar 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, left 11/28/2016  . Idiopathic peripheral neuropathy 11/28/2016  . 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 Long term (current) use of anticoagulants 08/23/2015  . Right upper quadrant abdominal pain 08/23/2015  . Memory loss 05/10/2015  . Gait abnormality 04/07/2015  . Alkaline phosphatase elevation 04/07/2015  . History of thrombosis 03/26/2015  . Medial epicondylitis 02/07/2015  . Cognitive decline 12/21/2014  . Leukopenia 12/05/2014  . Rotator cuff syndrome of right shoulder 10/27/2014  . Status post left knee replacement 08/22/2014  . Left lateral epicondylitis 08/22/2014  . Chronic cerebral ischemia 08/18/2014  . Arthrofibrosis of knee joint 08/17/2014  . Cubital canal compression syndrome, left 08/17/2014  . 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 Chronic pain syndrome 04/10/2014  . Insomnia 04/10/2014   . Chronic non-specific white matter lesions on MRI 04/10/2014  . CFS (chronic fatigue syndrome) 04/10/2014  . 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 Biceps tendonitis on left 03/01/2014  . Polycythemia vera ([place]) 12/27/2013  . H/O TIA (transient ischemic attack) and stroke 12/27/2013  . Neck pain 12/27/2013  . OSA (obstructive sleep apnea) 12/08/2013  . 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 Depression with anxiety 08/04/2013  . Protein C deficiency ([place]) 08/01/2013  . Post traumatic stress disorder (PTSD) 08/01/2013  . Speech abnormality 07/25/2013  . Obesity 05/10/2013  . Lower extremity edema 05/10/2013  . GERD (gastroesophageal reflux disease) 05/10/2013  . Arthritis 05/10/2013  . OA (osteoarthritis) of knee 03/15/2013  . Headache 10/25/2012  . Palpitations 10/18/2012  . Fatty liver determined by biopsy 06/01/2012  . Arthropathic psoriasis, unspecified ([place]) 04/29/2012  . Abnormal liver enzymes 03/29/2012  . Psoriatic arthritis ([place]) 12/29/2011  . Left Renal Hydronephrosis 12/11/2010  . 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 Celiac disease 05/27/2010  . Thyroid nodule 05/27/2010  . Male-to-male transgender person 09/20/2002    Arliss Journey, PT, DPT  03/11/2019, 3:08 PM  [facility] [address] [place], [place], [address] Phone: [phone]   Fax:  [phone]  Name: Coolidge Gossard MRN: [id number] Date of Birth: 10-20-1965

## 2019-03-15 ENCOUNTER — Ambulatory Visit (INDEPENDENT_AMBULATORY_CARE_PROVIDER_SITE_OTHER): Payer: BC Managed Care – PPO | Admitting: Psychology

## 2019-03-15 ENCOUNTER — Other Ambulatory Visit: Payer: Self-pay

## 2019-03-15 ENCOUNTER — Ambulatory Visit: Payer: BC Managed Care – PPO | Admitting: Occupational Therapy

## 2019-03-15 DIAGNOSIS — R2689 Other abnormalities of gait and mobility: Secondary | ICD-10-CM

## 2019-03-15 DIAGNOSIS — R278 Other lack of coordination: Secondary | ICD-10-CM

## 2019-03-15 DIAGNOSIS — R29818 Other symptoms and signs involving the nervous system: Secondary | ICD-10-CM

## 2019-03-15 DIAGNOSIS — M6281 Muscle weakness (generalized): Secondary | ICD-10-CM

## 2019-03-15 DIAGNOSIS — R2681 Unsteadiness on feet: Secondary | ICD-10-CM

## 2019-03-15 DIAGNOSIS — F4312 Post-traumatic stress disorder, chronic: Secondary | ICD-10-CM | POA: Diagnosis not present

## 2019-03-15 DIAGNOSIS — R29898 Other symptoms and signs involving the musculoskeletal system: Secondary | ICD-10-CM | POA: Diagnosis not present

## 2019-03-15 DIAGNOSIS — M79641 Pain in right hand: Secondary | ICD-10-CM

## 2019-03-15 NOTE — Patient Instructions (Signed)
   1. Grip Strengthening (Resistive Putty)   Squeeze putty using thumb and all fingers. Repeat 20 times. Do 1-2 sessions per day.   Extension (Assistive Putty)   Roll putty back and forth, being sure to use all fingertips. Repeat 2-3 times. Do 1-2 sessions per day.  Then pinch as below.   Palmar Pinch Strengthening (Resistive Putty)   Pinch putty between thumb and each fingertip in turn after rolling out    Finger and Thumb Extension (Resistive Putty)   With thumb and all fingers in center of putty donut, stretch out. Repeat 5-10 times. Do 1-2 sessions per day.    MP Flexion (Resistive Putty)   Bending only at large knuckles, press putty down against thumb. Keep fingertips straight. Repeat 10 times. Do 1-2 sessions per day.   Lateral Pinch Strengthening (Resistive Putty)    Squeeze between thumb and side of each finger in turn. Repeat 10 times. Do 1-2 sessions per day.   Thumb Adduction (Resistive Putty)    Press putty with thumb against index finger. Keep all fingers straight. Repeat ____ times. Do ____ sessions per day.

## 2019-03-15 NOTE — Therapy (Signed)
[facility] [address] [place], [place], [address] Phone: [phone]   Fax:  [phone]  Occupational Therapy Treatment  Patient Details  Name: Jesse Bowers MRN: [id number] Date of Birth: Aug 27, 1965 Referring Provider (OT): Dr. [name]   Encounter Date: 03/15/2019  OT End of Session - 03/15/19 1114    Visit Number  2    Number of Visits  13    Date for OT Re-Evaluation  04/23/19    Authorization Type  BC/BS primary, MCR secondary (however may lose part B) - 30 visit limit combined PT/OT (Same day counts as 1 visit)    Authorization - Visit Number  2    Authorization - Number of Visits  15    OT Start Time  1107    OT Stop Time  1145    OT Time Calculation (min)  38 min    Activity Tolerance  Patient tolerated treatment well    Behavior During Therapy  [facility] for tasks assessed/performed       Past Medical History:  Diagnosis Date  . Abnormal weight loss   . Anxiety   . Arthritis   . Cataract    OU  . Celiac disease   . Cervical neck pain with evidence of disc disease    patient has a cyst   . Chronic constipation   . Chronic diastolic heart failure ([place])    Pt. denies  . Chronic pain   . Degenerative disc disease at L5-S1 level    with stenosis  . DVT (deep venous thrombosis) (H[facility])    Right upper arm, bilateral leg  . Eczema    inguinal, feet  . Elevated liver enzymes   . 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 Family history of adverse reaction to anesthesia    family has problems with anesthesia of nausea and vomiting   . 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 GERD (gastroesophageal reflux disease)    History of in 20's  . Gluten enteropathy   . H/O parotitis    right   . Hard of hearing   . History of kidney stones   . History of retinal tear    Bilateral  . History of staph infection    required wound vac  . Hx-TIA (transient ischemic attack)    2015  . LVH (left ventricular  hypertrophy) 12/15/2016   Mild, noted on ECHO  . MVP (mitral valve prolapse)   . NAFL (nonalcoholic fatty liver)   . Neck pain   . Neuromuscular disorder ([place])    bilateral neuropathy feet.  . Pneumonia 12/17/2010  . Polycythemia   . Polycythemia, secondary   . PONV (postoperative nausea and vomiting)   . Protein C deficiency ([place])    Dr. Anne Fu  . Psoriasis    16 X10 cm psoriatic rash on sole of left foot ; open and occ scant bleeding;   . psoriatic arthritis   . PTSD (post-traumatic stress disorder)   . Scaphoid fracture of wrist 09/23/2013  . Seizure ([place])    childhood, medication until age 54 then weanned completely off  . Sleep apnea    split night study last done by Dr. [name] 06/18/15 shows severe OSA, CSA, and hypersomnia, rec bipap  . Splenomegaly   . Stenosis of ureteropelvic junction (UPJ)    left  . Stroke [facility])    CVA vs TIA in left cerebrum causing slight right sided weakness-Dr. [name] follows  . 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 Tachycardia    hx of   . Transfusion history    past history- none recent, after surgeries due to blood loss  . Wears glasses   . Wears hearing aid     Past Surgical History:  Procedure Laterality Date  . ABDOMINAL HYSTERECTOMY Bilateral 1994   TAH, BSO- tranverse incision at 54 yo  . ANKLE ARTHROSCOPY WITH RECONSTRUCTION Right 2007  . CHOLECYSTECTOMY     laparoscopic  . COLONOSCOPY     x3  . EYE SURGERY     Left eye 03/02/2018, right 02/15/2018  . HIP ARTHROSCOPY W/ LABRAL REPAIR Right 05/11/2013   acetabular labral tear 03/30/2013  . KNEE ARTHROPLASTY Right   . KNEE JOINT MANIPULATION Left    x3 under anesthesia  . KNEE SURGERY Bilateral 1984   Right ACL, left PCL repair  . LITHOTRIPSY  2005  . LIVER BIOPSY  2013   normal results.  [name] MASTECTOMY Bilateral    prior to 2009  . MOUTH SURGERY    . NASAL SEPTUM SURGERY N/A 09/20/2015   by ENT Dr. [name]  . OVARIAN CYST SURGERY Left    size  of grapefruit, was informed that she had shortened vagina  . SHOULDER SURGERY Bilateral    Right 08/15/2016, Left 11/15/2016  . THUMB ARTHROSCOPY Left   . THYROIDECTOMY, PARTIAL Left 2008  . TOTAL KNEE ARTHROPLASTY Right 08/23/2018   Procedure: TOTAL KNEE ARTHROPLASTY;  Surgeon: [name], MD;  Location: [facility];  Service: Orthopedics;  Laterality: Right;  23mn  . TOTAL KNEE REVISION Left 02/06/2016   Procedure: LEFT TOTAL KNEE REVISION;  Surgeon: FGaynelle Arabian MD;  Location: [facility];  Service: Orthopedics;  Laterality: Left;  . TOTAL KNEE REVISION Left 04/22/2017   Procedure: Left knee polyethylene revision;  Surgeon: AGaynelle Arabian MD;  Location: [facility];  Service: Orthopedics;  Laterality: Left;  . UPPER GI ENDOSCOPY  2003    There were no vitals filed for this visit.  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 PMH: Psoriatic arthritis, CVA, s/p Rt TKR 08/23/18, Lt knee revision 04/2017, lumbar radiculitis, multiple scaphoid fx's Rt thumb    Limitations  fall risk    Patient Stated Goals  cut my food, holding fork and pen/pencil better w/ less pain in Rt hand    Currently in Pain?  Yes    Pain Score  8     Pain Location  Wrist   forearm   Pain Orientation  Right   dorsal   Pain Descriptors / Indicators  Aching;Dull    Pain Type  Acute pain    Pain Onset  More than a month ago    Pain Frequency  Constant    Aggravating Factors   overuse, wrist flex, add/abduction of thumb    Pain Relieving Factors  rest         Trial of foam (tan) built up grip for writing with incr ease demo/reported.  Also trial of the pencil grip and pen again, but did not help.  Pt issued tan and red foam for home and discussed various uses (grooming, eating, writing).    Pt with mild-mod edema today in R hand s/p fall with 03/13/19 with R wrist  injury and pain.  Pt instructed to  only use yellow putty with L hand for now until he follows up with Ortho MD (plans to go today).     Discussed AE for eating including foam grip and rocker knife (pt has a rocker knife, but may benefit from vertical handle rocker knife to decr stress on joints and decr pain and incr ease--pt shown this option).     OT Education - 03/15/19 1542    Education Details  Yellow putty HEP--see pt instructions.  Built-up foam grips for pen/utensils/toothbrush (issued tan and red)    Person(s) Educated  Patient    Methods  Explanation;Demonstration;Verbal cues;Handout    Comprehension  Verbalized understanding;Returned demonstration       OT Short Term Goals - 03/08/19 1530      OT SHORT TERM GOAL #1   Title  Independent with UE strengthening HEP (shoulders and hands)    Time  3    Period  Weeks    Status  New      OT SHORT TERM GOAL #2   Title  Pt to verbalize understanding with potential A/E needs to increase ease and independence with ADLS (specifically cutting food, writing)    Time  3    Period  Weeks    Status  New      OT SHORT TERM GOAL #3   Title  Pt to be assessed for potential splints to help reduce pain Rt thumb and Lt thumb prn    Time  3    Period  Weeks    Status  New        OT Long Term Goals - 03/08/19 1533      OT LONG TERM GOAL #1   Title  Pt to report greater ease with BADLS using A/E and compensatory strategies prn    Time  6    Period  Weeks    Status  New      OT LONG TERM GOAL #2   Title  Pt to improve bilateral grip strength by 5 lbs for gripping activities    Time  6    Period  Weeks    Status  New      OT LONG TERM GOAL #3   Title  Pt to perform snack prep/sandwich prep from standing level w/ rest breaks prn safely w/o LOB    Time  6    Period  Weeks    Status  New            Plan - 03/15/19 1543    Clinical Impression Statement  Pt verbalized understanding of initial putty HEP for L hand only at  this time.  Pt to see Ortho MD due to R hand injury and swelling today s/p fall 03/13/19.   Pt also able to write easier with foam built-up grip with no pain today.  Progressing towards goals.    Occupational performance deficits (Please refer to evaluation for details):  ADL's;IADL's;Leisure;Social Participation    Body Structure / Function / Physical Skills  ADL;Dexterity;ROM;IADL;Body mechanics;Mobility;Coordination;FMC;Pain;UE functional use;Decreased knowledge of use of DME;Sensation    Rehab Potential  Good   for goals   Clinical Decision Making  Several treatment options, min-mod task modification necessary    Comorbidities Affecting Occupational Performance:  Presence of comorbidities impacting occupational performance    Comorbidities impacting occupational performance description:  psoriatic arthritis, lumbar radiculitis    Modification or Assistance to Complete Evaluation   Min-Moderate modification of tasks or assist with assess necessary to complete eval  OT Frequency  2x / week    OT Duration  6 weeks   plus eval (or 12 visits over extended weeks d/t potential scheduling conflicts)   OT Treatment/Interventions  Self-care/ADL training;Therapeutic exercise;Functional Mobility Training;Aquatic Therapy;Neuromuscular education;Manual Therapy;Splinting;Therapeutic activities;Coping strategies training;Energy conservation;DME and/or AE instruction;Passive range of motion;Patient/family education;Moist Heat    Plan  pt to bring in splints to assess for thumbs, initiate HEP for UE strength in shoulders and putty HEP(review),  check on R hand injury/pain s/p fall, continue with AE/modifications for ADLs as able.    Consulted and Agree with Plan of Care  Patient       Patient will benefit from skilled therapeutic intervention in order to improve the following deficits and impairments:   Body Structure / Function / Physical Skills: ADL, Dexterity, ROM, IADL, Body mechanics, Mobility,  Coordination, FMC, Pain, UE functional use, Decreased knowledge of use of DME, Sensation       Visit Diagnosis: Muscle weakness (generalized)  Other lack of coordination  Unsteadiness on feet  Other symptoms and signs involving the musculoskeletal system  Pain in right hand  Other abnormalities of gait and mobility  Other symptoms and signs involving the nervous system    Problem List Patient Active Problem List   Diagnosis Date Noted  . Osteoarthritis of right knee 08/23/2018  . Constipation due to opioid therapy 03/30/2018  . Retinopathy of both eyes 01/06/2018  . SNHL (sensorineural hearing loss) 12/04/2017  . Abnormal urinary stream 12/03/2017  . Osteoarthritis of carpometacarpal (CMC) joint of thumb 11/30/2017  . Muscle weakness 11/17/2017  . Transient vision disturbance 11/12/2017  . Bilateral hand pain 10/30/2017  . Pain of left hip joint 10/09/2017  . Gynecomastia 07/10/2017  . Iron deficiency anemia 07/05/2017  . Spasticity 05/20/2017  . Lumbar radiculitis 04/20/2017  . Ulnar 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, left 11/28/2016  . Idiopathic peripheral neuropathy 11/28/2016  . 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 Long term (current) use of anticoagulants 08/23/2015  . Right upper quadrant abdominal pain 08/23/2015  . Memory loss 05/10/2015  . Gait abnormality 04/07/2015  . Alkaline phosphatase elevation 04/07/2015  . History of thrombosis 03/26/2015  . Medial epicondylitis 02/07/2015  . Cognitive decline 12/21/2014  . Leukopenia 12/05/2014  . Rotator cuff syndrome of right shoulder 10/27/2014  . Status post left knee replacement 08/22/2014  . Left lateral epicondylitis 08/22/2014  . Chronic cerebral ischemia 08/18/2014  . Arthrofibrosis of knee joint 08/17/2014  . Cubital canal compression syndrome, left 08/17/2014  . 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 Chronic pain syndrome 04/10/2014  . Insomnia 04/10/2014  . Chronic non-specific white matter  lesions on MRI 04/10/2014  . CFS (chronic fatigue syndrome) 04/10/2014  . 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 Biceps tendonitis on left 03/01/2014  . Polycythemia vera ([place]) 12/27/2013  . H/O TIA (transient ischemic attack) and stroke 12/27/2013  . Neck pain 12/27/2013  . OSA (obstructive sleep apnea) 12/08/2013  . 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 Depression with anxiety 08/04/2013  . Protein C deficiency ([place]) 08/01/2013  . Post traumatic stress disorder (PTSD) 08/01/2013  . Speech abnormality 07/25/2013  . Obesity 05/10/2013  . Lower extremity edema 05/10/2013  . GERD (gastroesophageal reflux disease) 05/10/2013  . Arthritis 05/10/2013  . OA (osteoarthritis) of knee 03/15/2013  . Headache 10/25/2012  . Palpitations 10/18/2012  . Fatty liver determined by biopsy 06/01/2012  . Arthropathic psoriasis, unspecified ([place]) 04/29/2012  . Abnormal liver enzymes 03/29/2012  . Psoriatic arthritis ([place]) 12/29/2011  .  Left Renal Hydronephrosis 12/11/2010  . 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 Celiac disease 05/27/2010  . Thyroid nodule 05/27/2010  . Male-to-male transgender person 09/20/2002    [facility] 03/15/2019, 3:45 PM  [facility] [address] [place], [place], [address] Phone: [phone]   Fax:  [phone]  Name: Noor Vidales MRN: [id number] Date of Birth: [birth year]   Vianne Bulls, OTR/L [facility] [address] [place],   [address] [phone] phone [phone] 03/15/19 3:45 PM

## 2019-03-17 ENCOUNTER — Ambulatory Visit (INDEPENDENT_AMBULATORY_CARE_PROVIDER_SITE_OTHER): Payer: BC Managed Care – PPO | Admitting: Psychology

## 2019-03-17 DIAGNOSIS — F4323 Adjustment disorder with mixed anxiety and depressed mood: Secondary | ICD-10-CM | POA: Diagnosis not present

## 2019-03-18 ENCOUNTER — Encounter: Payer: Self-pay | Admitting: Occupational Therapy

## 2019-03-18 ENCOUNTER — Ambulatory Visit: Payer: BC Managed Care – PPO | Admitting: Physical Therapy

## 2019-03-18 ENCOUNTER — Encounter: Payer: Self-pay | Admitting: Physical Therapy

## 2019-03-21 ENCOUNTER — Ambulatory Visit: Payer: BC Managed Care – PPO | Admitting: Physical Therapy

## 2019-03-21 ENCOUNTER — Encounter: Payer: BC Managed Care – PPO | Attending: Physical Medicine & Rehabilitation | Admitting: Registered Nurse

## 2019-03-21 ENCOUNTER — Encounter: Payer: Self-pay | Admitting: Registered Nurse

## 2019-03-21 ENCOUNTER — Other Ambulatory Visit: Payer: Self-pay

## 2019-03-21 VITALS — BP 120/81 | HR 100 | Temp 97.7°F

## 2019-03-21 DIAGNOSIS — Z7901 Long term (current) use of anticoagulants: Secondary | ICD-10-CM | POA: Diagnosis not present

## 2019-03-21 DIAGNOSIS — L4050 Arthropathic psoriasis, unspecified: Secondary | ICD-10-CM | POA: Diagnosis not present

## 2019-03-21 DIAGNOSIS — I69320 Aphasia following cerebral infarction: Secondary | ICD-10-CM | POA: Diagnosis not present

## 2019-03-21 DIAGNOSIS — M545 Low back pain: Secondary | ICD-10-CM | POA: Diagnosis not present

## 2019-03-21 DIAGNOSIS — D751 Secondary polycythemia: Secondary | ICD-10-CM | POA: Diagnosis not present

## 2019-03-21 DIAGNOSIS — R209 Unspecified disturbances of skin sensation: Secondary | ICD-10-CM | POA: Diagnosis not present

## 2019-03-21 DIAGNOSIS — Z96652 Presence of left artificial knee joint: Secondary | ICD-10-CM

## 2019-03-21 DIAGNOSIS — Z79899 Other long term (current) drug therapy: Secondary | ICD-10-CM | POA: Insufficient documentation

## 2019-03-21 DIAGNOSIS — M7522 Bicipital tendinitis, left shoulder: Secondary | ICD-10-CM | POA: Diagnosis not present

## 2019-03-21 DIAGNOSIS — S43002A Unspecified subluxation of left shoulder joint, initial encounter: Secondary | ICD-10-CM | POA: Diagnosis not present

## 2019-03-21 DIAGNOSIS — G8929 Other chronic pain: Secondary | ICD-10-CM | POA: Insufficient documentation

## 2019-03-21 DIAGNOSIS — W19XXXA Unspecified fall, initial encounter: Secondary | ICD-10-CM

## 2019-03-21 DIAGNOSIS — M5412 Radiculopathy, cervical region: Secondary | ICD-10-CM

## 2019-03-21 DIAGNOSIS — Z79891 Long term (current) use of opiate analgesic: Secondary | ICD-10-CM

## 2019-03-21 DIAGNOSIS — M13862 Other specified arthritis, left knee: Secondary | ICD-10-CM | POA: Insufficient documentation

## 2019-03-21 DIAGNOSIS — M542 Cervicalgia: Secondary | ICD-10-CM

## 2019-03-21 DIAGNOSIS — K900 Celiac disease: Secondary | ICD-10-CM | POA: Insufficient documentation

## 2019-03-21 DIAGNOSIS — M7062 Trochanteric bursitis, left hip: Secondary | ICD-10-CM

## 2019-03-21 DIAGNOSIS — M7061 Trochanteric bursitis, right hip: Secondary | ICD-10-CM

## 2019-03-21 DIAGNOSIS — M5416 Radiculopathy, lumbar region: Secondary | ICD-10-CM | POA: Diagnosis not present

## 2019-03-21 DIAGNOSIS — Z87890 Personal history of sex reassignment: Secondary | ICD-10-CM | POA: Insufficient documentation

## 2019-03-21 DIAGNOSIS — I69351 Hemiplegia and hemiparesis following cerebral infarction affecting right dominant side: Secondary | ICD-10-CM | POA: Insufficient documentation

## 2019-03-21 DIAGNOSIS — D6859 Other primary thrombophilia: Secondary | ICD-10-CM | POA: Insufficient documentation

## 2019-03-21 DIAGNOSIS — R252 Cramp and spasm: Secondary | ICD-10-CM | POA: Diagnosis present

## 2019-03-21 DIAGNOSIS — Z9181 History of falling: Secondary | ICD-10-CM | POA: Diagnosis not present

## 2019-03-21 DIAGNOSIS — Z5181 Encounter for therapeutic drug level monitoring: Secondary | ICD-10-CM

## 2019-03-21 DIAGNOSIS — I69398 Other sequelae of cerebral infarction: Secondary | ICD-10-CM | POA: Diagnosis not present

## 2019-03-21 DIAGNOSIS — Z96651 Presence of right artificial knee joint: Secondary | ICD-10-CM

## 2019-03-21 DIAGNOSIS — F418 Other specified anxiety disorders: Secondary | ICD-10-CM | POA: Diagnosis not present

## 2019-03-21 DIAGNOSIS — R29898 Other symptoms and signs involving the musculoskeletal system: Secondary | ICD-10-CM

## 2019-03-21 DIAGNOSIS — Z86718 Personal history of other venous thrombosis and embolism: Secondary | ICD-10-CM | POA: Diagnosis not present

## 2019-03-21 DIAGNOSIS — R278 Other lack of coordination: Secondary | ICD-10-CM

## 2019-03-21 DIAGNOSIS — M6281 Muscle weakness (generalized): Secondary | ICD-10-CM

## 2019-03-21 DIAGNOSIS — Y92009 Unspecified place in unspecified non-institutional (private) residence as the place of occurrence of the external cause: Secondary | ICD-10-CM

## 2019-03-21 DIAGNOSIS — R2681 Unsteadiness on feet: Secondary | ICD-10-CM

## 2019-03-21 DIAGNOSIS — G894 Chronic pain syndrome: Secondary | ICD-10-CM

## 2019-03-21 MED ORDER — OXYCODONE HCL 10 MG PO TABS: 10.0000 mg | ORAL_TABLET | Freq: Three times a day (TID) | ORAL | 0 refills | Status: DC | PRN

## 2019-03-21 MED ORDER — BACLOFEN 20 MG PO TABS: 20.0000 mg | ORAL_TABLET | Freq: Four times a day (QID) | ORAL | 3 refills | Status: DC

## 2019-03-21 MED ORDER — MORPHINE SULFATE ER 60 MG PO CP24: 60.0000 mg | ORAL_CAPSULE | Freq: Two times a day (BID) | ORAL | 0 refills | Status: DC

## 2019-03-21 NOTE — Progress Notes (Signed)
Subjective:    Patient ID: Jesse Bowers, adult    DOB: [birth year], 54 y.o.   MRN: [id number]  HPI: Jesse Bowers is a 54 y.o. choose not to disclose who returns for follow up appointment for chronic pain and medication refill. He states his  pain is located in his neck radiating into his right shoulder, mid- lower back pain radiating into his buttocks and bilateral lower extremities, bilateral hip pain and bilateral knee pain. He rates his pain 8. His  current exercise regime is walking in his home with walker, also reports at times he furniture and wall walks, he was encouraged to use his walker at all times. He verbalizes understanding.  Also reports on 03/14/2019 he was sitting on the couch assisting his wife with removing their [place], when he began to stand his legs gave out and he landed on his knees. His wife helped  Him up. He went to Urgent care the next day and followed up with his orthopedist he reports. Educated on falls prevention he verbalizes understanding.   Jesse Bowers equivalent is 180.00 MME. He has weaned his Oxycodone to TID as needed for pain. We will continue to monitor.    Last Oral Swab was Performed on 11/10/2018, it was consistent.    Pain Inventory Average Pain 6 Pain Right Now 8 My pain is constant, sharp, dull and aching  In the last 24 hours, has pain interfered with the following? General activity 9 Relation with others 8 Enjoyment of life 9 What TIME of day is your pain at its worst? daytime, night  Sleep (in general) Poor  Pain is worse with: walking, bending, sitting, standing and some activites Pain improves with: rest, heat/ice, therapy/exercise, medication and injections Relief from Meds: 5  Mobility walk with assistance use a walker ability to climb steps?  yes do you drive?  yes use a wheelchair transfers alone Do you have any goals in this area?  yes  Function disabled: date disabled . I need assistance with the  following:  dressing, bathing, meal prep, household duties and shopping Do you have any goals in this area?  yes  Neuro/Psych weakness tremor tingling trouble walking spasms anxiety loss of taste or smell  Prior Studies Any changes since last visit?  no  Physicians involved in your care Any changes since last visit?  no   Family History  Problem Relation Age of Onset  . Stroke Maternal Grandfather        21  . Heart attack Maternal Grandfather   . Glaucoma Maternal Grandfather   . Macular degeneration Maternal Grandfather   . Hypertension Mother   . Psoriasis Mother   . Other Mother        meningioma developed ~2019  . Glaucoma Mother   . Cancer Paternal Grandfather   . Heart attack Paternal Grandfather   . Stroke Paternal Uncle        age 37  . Polycythemia Paternal Uncle   . Stroke Maternal Grandmother   . Congestive Heart Failure Maternal Grandmother   . Heart attack Maternal Grandmother   . Protein C deficiency Sister 47       Miscarriages   Social History   Socioeconomic History  . Marital status: Married    Spouse name: Not on file  . Number of children: 2  . Years of education: 4y college  . Highest education level: Not on file  Occupational History  . Occupation: Pediatric Nurse practitioner  Comment: Not working since CVA 2015  Tobacco Use  . Smoking status: Never Smoker  . Smokeless tobacco: Never Used  Substance and Sexual Activity  . Alcohol use: Yes    Comment: social  . Drug use: No  . Sexual activity: Yes    Birth control/protection: None    Comment: patient is a transgender on testosterone shots, no biological kids  Other Topics Concern  . Not on file  Social History Narrative   Education 4 year college, former Therapist, sports X 15 years, pediatric nurse practitioner x 6 years, did NP degree from [employer] [place]. Relocated to [place] about 2 months ago from [place], [place]. Patient was in [place] for last 4 years and prior to that in  [place]. His wife is working as Scientist, research (physical sciences) for [employer]. Patient is not working and applying for disability. They have 2 kids but no biologic children.    Social Determinants of Health   Financial Resource Strain:   . Difficulty of Paying Living Expenses: Not on file  Food Insecurity:   . Worried About Charity fundraiser in the Last Year: Not on file  . Ran Out of Food in the Last Year: Not on file  Transportation Needs:   . Lack of Transportation (Medical): Not on file  . Lack of Transportation (Non-Medical): Not on file  Physical Activity:   . Days of Exercise per Week: Not on file  . Minutes of Exercise per Session: Not on file  Stress:   . Feeling of Stress : Not on file  Social Connections:   . Frequency of Communication with Friends and Family: Not on file  . Frequency of Social Gatherings with Friends and Family: Not on file  . Attends Religious Services: Not on file  . Active Member of Clubs or Organizations: Not on file  . Attends Archivist Meetings: Not on file  . Marital Status: Not on file   Past Surgical History:  Procedure Laterality Date  . ABDOMINAL HYSTERECTOMY Bilateral 1994   TAH, BSO- tranverse incision at 54 yo  . ANKLE ARTHROSCOPY WITH RECONSTRUCTION Right 2007  . CHOLECYSTECTOMY     laparoscopic  . COLONOSCOPY     x3  . EYE SURGERY     Left eye 03/02/2018, right 02/15/2018  . HIP ARTHROSCOPY W/ LABRAL REPAIR Right 05/11/2013   acetabular labral tear 03/30/2013  . KNEE ARTHROPLASTY Right   . KNEE JOINT MANIPULATION Left    x3 under anesthesia  . KNEE SURGERY Bilateral 1984   Right ACL, left PCL repair  . LITHOTRIPSY  2005  . LIVER BIOPSY  2013   normal results.  [name] MASTECTOMY Bilateral    prior to 2009  . MOUTH SURGERY    . NASAL SEPTUM SURGERY N/A 09/20/2015   by ENT Dr. [name]  . OVARIAN CYST SURGERY Left    size of grapefruit, was informed that she had shortened vagina  . SHOULDER SURGERY Bilateral    Right 08/15/2016,  Left 11/15/2016  . THUMB ARTHROSCOPY Left   . THYROIDECTOMY, PARTIAL Left 2008  . TOTAL KNEE ARTHROPLASTY Right 08/23/2018   Procedure: TOTAL KNEE ARTHROPLASTY;  Surgeon: [name], MD;  Location: [facility];  Service: Orthopedics;  Laterality: Right;  11mn  . TOTAL KNEE REVISION Left 02/06/2016   Procedure: LEFT TOTAL KNEE REVISION;  Surgeon: FGaynelle Arabian MD;  Location: [facility];  Service: Orthopedics;  Laterality: Left;  . TOTAL KNEE REVISION Left 04/22/2017   Procedure: Left knee polyethylene revision;  Surgeon: [name], MD;  Location: [facility];  Service: Orthopedics;  Laterality: Left;  . UPPER GI ENDOSCOPY  2003   Past Medical History:  Diagnosis Date  . Abnormal weight loss   . Anxiety   . Arthritis   . Cataract    OU  . Celiac disease   . Cervical neck pain with evidence of disc disease    patient has a cyst   . Chronic constipation   . Chronic diastolic heart failure ([place])    Pt. denies  . Chronic pain   . Degenerative disc disease at L5-S1 level    with stenosis  . DVT (deep venous thrombosis) ([facility])    Right upper arm, bilateral leg  . Eczema    inguinal, feet  . Elevated liver enzymes   . 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 Family history of adverse reaction to anesthesia    family has problems with anesthesia of nausea and vomiting   . 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 GERD (gastroesophageal reflux disease)    History of in 20's  . Gluten enteropathy   . H/O parotitis    right   . Hard of hearing   . History of kidney stones   . History of retinal tear    Bilateral  . History of staph infection    required wound vac  . Hx-TIA (transient ischemic attack)    2015  . LVH (left ventricular hypertrophy) 12/15/2016   Mild, noted on ECHO  . MVP (mitral valve prolapse)   . NAFL (nonalcoholic fatty liver)   . Neck pain   . Neuromuscular disorder ([place])    bilateral neuropathy feet.  . Pneumonia 12/17/2010  . Polycythemia   .  Polycythemia, secondary   . PONV (postoperative nausea and vomiting)   . Protein C deficiency ([place])    Dr. Anne Fu  . Psoriasis    16 X10 cm psoriatic rash on sole of left foot ; open and occ scant bleeding;   . psoriatic arthritis   . PTSD (post-traumatic stress disorder)   . Scaphoid fracture of wrist 09/23/2013  . Seizure ([place])    childhood, medication until age 34 then weanned completely off  . Sleep apnea    split night study last done by Dr. [name] 06/18/15 shows severe OSA, CSA, and hypersomnia, rec bipap  . Splenomegaly   . Stenosis of ureteropelvic junction (UPJ)    left  . Stroke [facility])    CVA vs TIA in left cerebrum causing slight right sided weakness-Dr. [name] follows  . Syrinx of spinal cord ([place]) 01/06/2014   c spine on MRI  . Tachycardia    hx of   . Transfusion history    past history- none recent, after surgeries due to blood loss  . Wears glasses   . Wears hearing aid    BP 120/81   Pulse 100   Temp 97.7 F (36.5 C)   SpO2 98%   Opioid Risk Score:   Fall Risk Score:  `1  Depression screen PHQ 2/9  Depression screen PHQ 2/9 02/02/2018  Decreased Interest 0  Down, Depressed, Hopeless 0  PHQ - 2 Score 0  Some recent data might be hidden    Review of Systems  Constitutional: Positive for appetite change, chills, diaphoresis, fever and unexpected weight change.  Respiratory: Positive for shortness of breath.   Cardiovascular: Positive for leg swelling.  Gastrointestinal: Positive for abdominal pain, constipation and nausea.  Genitourinary: Positive for  difficulty urinating.  Musculoskeletal: Positive for arthralgias, back pain, gait problem and neck pain.  Skin: Positive for rash.  Neurological: Positive for tremors and weakness.       Tingling  Hematological: Bruises/bleeds easily.  Psychiatric/Behavioral: The patient is nervous/anxious.        Objective:   Physical Exam Vitals and nursing note reviewed.  Constitutional:      Appearance:  Normal appearance.  Neck:     Comments: Cervical Paraspinal Tenderness: C-5-C-6 Cardiovascular:     Rate and Rhythm: Normal rate and regular rhythm.     Pulses: Normal pulses.     Heart sounds: Normal heart sounds.  Pulmonary:     Effort: Pulmonary effort is normal.     Breath sounds: Normal breath sounds.  Musculoskeletal:     Cervical back: Normal range of motion.     Comments: Normal Muscle Bulk and Muscle Testing Reveals:  Upper Extremities: Full ROM and Muscle Strength 5/5 Right AC Joint Tenderness  Thoracic Paraspinal Tenderness: T-1-T-3 Lumbar Hypersensitivity Bilateral Greater Trochanter Tenderness Lower Extremities: Full ROM and Muscle Strength 5/5 Arrived in wheelchair   Skin:    General: Skin is warm and dry.  Neurological:     Mental Status: He is alert and oriented to person, place, and time.  Psychiatric:        Mood and Affect: Mood normal.        Behavior: Behavior normal.           Assessment & Plan:  1. Psoriatic arthritis with pain in multiple areas, most prominently feet, hands, elbows. Refilled:Kadian 60 mg 24 hr. Capsule, one capsule every 12 hours #60, and Oxycodone 10 mg one tablet every8hours as needed #90.Second scriptssent for the following month. 03/21/2019. We will continue the opioid monitoring program, this consists of regular clinic visits, examinations, urine drug screen, pill counts as well as use of [place] Controlled Substance Reporting System.  Rheumatology Following. 2. Chronic Pain Syndrome: Continue Compound Cream.03/21/2019. 3. 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 S/P TKR on 07/03/14: Ortho Following: Dr. Wynelle Link perform Total Left Knee Revision on 02/06/2016. S/PLeft Knee  Revision on 04/22/17.S/PRight TKA on 08/23/2018 with Dr. Wynelle Link. 5. Chronic mid- low back pain: Continue current medication regime, and encourage to increase activity as tolerated.03/21/2019. 6. Polycythemia: Oncology Following.03/21/2019 7. Depression with anxiety :Psychiatry following.Continue to Monitor.03/21/2019 8. Cervicalgia:Continue HEP as tolerated and Continue to Monitor. Continue current medication regime. 03/21/2019 9. Muscle Spasticity: ContinueBaclofen11/18/2021 10.RightAnkle Pain: No complaints Today. Continue to Monitor. 03/21/2019. 10. Left lateral epicondylitis:No complaints today.Ortho Following.03/21/2019 11.BilateralShoulder Pain:No complaints today.Ortho Following.03/21/2019. 12. Lumbar Radiculitis:S/PLeft L5-S1 TranslaminarInjectionon 05/08/2020with, good relief noted.01/10/2019 13.BilateralGreater Trochanteric Bursitis: Continue with Ice and Heat Therapy: Contiue HEP as Tolerated. Continue to Monitor. 03/21/2019. 5. Fall: He reports on 03/14/2019, he went to Urgent Care the following day and followed up with his orthopedist. Educated on [place]. He verbalizes understanding.  70mnutes of face to face patient care time was spent during this visit. All questions were encouraged and answered.  F/U in 2 months

## 2019-03-21 NOTE — Therapy (Signed)
[facility] [place] [address] [place], [place], [address] Phone: [phone]   Fax:  [phone]  Physical Therapy Treatment  Patient Details  Name: Jesse Bowers MRN: [id number] Date of Birth: December 18, 1965 Referring Provider (PT): Clerance Lav, MD   Encounter Date: 03/21/2019  PT End of Session - 03/21/19 1039    Visit Number  3    Number of Visits  13    Date for PT Re-Evaluation  05/08/19    Authorization Type  BC/BS primary, MCR secondary (however may lose part B) - 30 visit limit combined PT/OT (Same day counts as 1 visit)    Authorization - Visit Number  3    Authorization - Number of Visits  30    PT Start Time  0808   pt arrived to session late   PT Stop Time  0846    PT Time Calculation (min)  38 min    Equipment Utilized During Treatment  Gait belt    Activity Tolerance  Patient tolerated treatment well   verbose   Behavior During Therapy  [facility] for tasks assessed/performed   verbose, needs re-direction at times      Past Medical History:  Diagnosis Date  . Abnormal weight loss   . Anxiety   . Arthritis   . Cataract    OU  . Celiac disease   . Cervical neck pain with evidence of disc disease    patient has a cyst   . Chronic constipation   . Chronic diastolic heart failure ([place])    Pt. denies  . Chronic pain   . Degenerative disc disease at L5-S1 level    with stenosis  . DVT (deep venous thrombosis) (HCC)    Right upper arm, bilateral leg  . Eczema    inguinal, feet  . Elevated liver enzymes   . 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 Family history of adverse reaction to anesthesia    family has problems with anesthesia of nausea and vomiting   . 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 GERD (gastroesophageal reflux disease)    History of in 20's  . Gluten enteropathy   . H/O parotitis    right   . Hard of hearing   . History of kidney stones   . History of retinal tear     Bilateral  . History of staph infection    required wound vac  . Hx-TIA (transient ischemic attack)    2015  . LVH (left ventricular hypertrophy) 12/15/2016   Mild, noted on ECHO  . MVP (mitral valve prolapse)   . NAFL (nonalcoholic fatty liver)   . Neck pain   . Neuromuscular disorder ([place])    bilateral neuropathy feet.  . Pneumonia 12/17/2010  . Polycythemia   . Polycythemia, secondary   . PONV (postoperative nausea and vomiting)   . Protein C deficiency ([place])    Dr. Anne Fu  . Psoriasis    16 X10 cm psoriatic rash on sole of left foot ; open and occ scant bleeding;   . psoriatic arthritis   . PTSD (post-traumatic stress disorder)   . Scaphoid fracture of wrist 09/23/2013  . Seizure ([place])    childhood, medication until age 56 then weanned completely off  . Sleep apnea    split night study last done by Dr. [name] 06/18/15 shows severe OSA, CSA, and hypersomnia, rec bipap  . Splenomegaly   . Stenosis of ureteropelvic junction (UPJ)    left  .  Stroke [facility])    CVA vs TIA in left cerebrum causing slight right sided weakness-Dr. [name] follows  . 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 Tachycardia    hx of   . Transfusion history    past history- none recent, after surgeries due to blood loss  . Wears glasses   . Wears hearing aid     Past Surgical History:  Procedure Laterality Date  . ABDOMINAL HYSTERECTOMY Bilateral 1994   TAH, BSO- tranverse incision at 54 yo  . ANKLE ARTHROSCOPY WITH RECONSTRUCTION Right 2007  . CHOLECYSTECTOMY     laparoscopic  . COLONOSCOPY     x3  . EYE SURGERY     Left eye 03/02/2018, right 02/15/2018  . HIP ARTHROSCOPY W/ LABRAL REPAIR Right 05/11/2013   acetabular labral tear 03/30/2013  . KNEE ARTHROPLASTY Right   . KNEE JOINT MANIPULATION Left    x3 under anesthesia  . KNEE SURGERY Bilateral 1984   Right ACL, left PCL repair  . LITHOTRIPSY  2005  . LIVER BIOPSY  2013   normal results.  [name] MASTECTOMY Bilateral     prior to 2009  . MOUTH SURGERY    . NASAL SEPTUM SURGERY N/A 09/20/2015   by ENT Dr. [name]  . OVARIAN CYST SURGERY Left    size of grapefruit, was informed that she had shortened vagina  . SHOULDER SURGERY Bilateral    Right 08/15/2016, Left 11/15/2016  . THUMB ARTHROSCOPY Left   . THYROIDECTOMY, PARTIAL Left 2008  . TOTAL KNEE ARTHROPLASTY Right 08/23/2018   Procedure: TOTAL KNEE ARTHROPLASTY;  Surgeon: [name], MD;  Location: [facility];  Service: Orthopedics;  Laterality: Right;  24mn  . TOTAL KNEE REVISION Left 02/06/2016   Procedure: LEFT TOTAL KNEE REVISION;  Surgeon: FGaynelle Arabian MD;  Location: [facility];  Service: Orthopedics;  Laterality: Left;  . TOTAL KNEE REVISION Left 04/22/2017   Procedure: Left knee polyethylene revision;  Surgeon: AGaynelle Arabian MD;  Location: [facility];  Service: Orthopedics;  Laterality: Left;  . UPPER GI ENDOSCOPY  2003    There were no vitals filed for this visit.  Subjective Assessment - 03/21/19 0811    Subjective  Wasn't able to get his braces on this morning, didn't bring them to session. Saw the orthopedic doctor on Friday, states that he has ITB tightness on LLE - states that the leg is weak and that is why it has been giving out. States that in a given day he is walking 500 steps total. Had 1 fall - has a chip at the sesmoid bone as well as there is some arthritis.    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 PMH: Psoriatic arthritis, Polycythemia vera, CVA, s/p Rt TKR 08/23/18, Lt knee revision 04/2017, lumbar radiculitis, multiple scaphoid fx's Rt thumb, chronic pain    How long can you walk comfortably?  can walk around the house - furniture walking    Diagnostic tests  MRI cervical spine 07/2017: Unchanged C4 through C6 cervical spinal cord syrinx since the MRIin March 2016, and stable by report since October 2015., MRI brain 2019: No acute intracranial abnormality and stable noncontrast MRI  appearance of the brain since 2017.    Patient Stated Goals  can't get up without using his hands, wants to be able to get out of his chair without using his hands - less pain in legs and back, be stronger.  Currently in Pain?  Yes    Pain Score  7     Pain Location  Knee   and both feet, low back   Pain Orientation  Right;Left    Pain Descriptors / Indicators  Aching;Hervey Ard                       [facility] Adult PT Treatment/Exercise - 03/21/19 0828      Transfers   Sit to Stand  5: Supervision    Sit to Stand Details  Verbal cues for sequencing;Verbal cues for technique    Stand to Sit  5: Supervision    Stand to Sit Details (indicate cue type and reason)  Verbal cues for sequencing;Verbal cues for technique    Stand to Sit Details  3 reps with use of single UE from mat and single UE on RW. Pt with increased hip ADD/IR     Comments  stand step transfer from mat table <> power w/c with RW: min guard, cues to keep RW close      Ambulation/Gait   Ambulation/Gait  Yes    Ambulation/Gait Assistance  4: Min assist;4: Min guard    Ambulation/Gait Assistance Details  chair follow for safety- not needed. Cues for posture and walking in RW.      Ambulation Distance (Feet)  115 Feet    Assistive device  Rolling walker    Gait Pattern  Step-through pattern;Trunk flexed;Decreased dorsiflexion - left;Decreased dorsiflexion - right;Decreased hip/knee flexion - right;Decreased hip/knee flexion - left;Decreased stance time - left;Right foot flat;Left foot flat    Ambulation Surface  Level;Indoor      Exercises   Exercises  Knee/Hip      Knee/Hip Exercises: Stretches   Active Hamstring Stretch  2 reps;30 seconds    Active Hamstring Stretch Limitations  B - seated at edge of mat, pt reporting feeling more of a stretch in calves      Knee/Hip Exercises: Seated   Long Arc Quad  Both;10 reps;2 sets   2lb on R, 1lb on L            PT Education - 03/21/19 1039    Education  Details  continue with HEP    Person(s) Educated  Patient    Methods  Explanation    Comprehension  Verbalized understanding       PT Short Term Goals - 03/09/19 1302      PT SHORT TERM GOAL #1   Title  Pt will be independent with initial HEP for improved strength, balance, and gait. ALL STGS DUE 04/06/19    Time  4   due to delay in scheduling   Period  Weeks    Status  New    Target Date  04/06/19      PT SHORT TERM GOAL #2   Title  Patient will undergo further assessment of TUG with use of RW vs. BERG  in order to determine fall risk - goal written as appropriate.    Baseline  not yet assessed.    Time  4    Period  Weeks    Status  New      PT SHORT TERM GOAL #3   Title  Patient will verbalize understanding of fall prevention strategies in the home.    Time  4    Period  Weeks    Status  New      PT SHORT TERM GOAL #4   Title  Patient  will ambulate at least 60' with RW with supervision in order to safely ambulate at home.    Baseline  not yet assessed.    Time  4    Period  Weeks    Status  New      PT SHORT TERM GOAL #5   Title  Patient will perform 4 steps with supervision using single railing and step to pattern in order to increase safety at home with stairs.    Baseline  not yet assessed.    Time  4    Period  Weeks    Status  New      Additional Short Term Goals   Additional Short Term Goals  Yes      PT SHORT TERM GOAL #6   Title  Perform 5x sit to stand from standard arm chair vs. mat table and write goal as appropriate.    Time  4    Period  Weeks    Status  New        PT Long Term Goals - 03/09/19 1306      PT LONG TERM GOAL #1   Title  Pt will be independent with final HEP for land/aquatic therapy for improved strength, ROM, balance, and gait. ALL LTGS DUE 05/04/19    Time  8   due to delay in scheduling   Period  Weeks    Status  New    Target Date  05/04/19      PT LONG TERM GOAL #2   Title  BERG vs. TUG score to be written as  appropriate to determine fall risk.    Time  8    Period  Weeks    Status  New      PT LONG TERM GOAL #3   Title  Patient will ambulate at least 100' with RW with mod I in order to safely ambulate at home.    Baseline  not yet assessed.    Time  8    Period  Weeks    Status  New      PT LONG TERM GOAL #4   Title  Patient will perform 12 steps with supervision using single railing and step to pattern in order to increase safety at home with stairs.    Baseline  not yet assessed.    Time  8    Period  Weeks    Status  New      PT LONG TERM GOAL #5   Title  Patient will perform bed mobility, stap step transfers with RW with mod I in order to decrease caregiver burden.    Baseline  not yet assessed.    Time  8    Period  Weeks    Status  New      PT LONG TERM GOAL #6   Title  5x sit <> stand goal to be written as appropriate.    Time  8    Period  Weeks    Status  New            Plan - 03/21/19 1043    Clinical Impression Statement  Focus of today's skilled session was gait training, hamstring ROM, and LE strengthening. Pt ambulated 115' with RW with min guard/min A and chair follow for safety, pt reported feeling fatigued as this is more walking than he does at home - ambulated without AFOs. Pt with no episodes of foot catching or knee buckling. With long arc quads  with weights, pt's LLE is weaker than RLE. Will continue to progress towards LTGs.    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 PMH: Psoriatic arthritis, Polycythemia vera, CVA, s/p Rt TKR 08/23/18, Lt knee revision 04/2017, lumbar radiculitis, multiple scaphoid fx's Rt thumb, chronic pain    Examination-Activity Limitations  Transfers;Stairs;Locomotion Level;Stand    Examination-Participation Restrictions  Community Activity    Stability/Clinical Decision Making  Evolving/Moderate complexity    Rehab Potential  Good     PT Frequency  2x / week    PT Duration  6 weeks    PT Treatment/Interventions  ADLs/Self Care Home Management;Neuromuscular re-education;Therapeutic activities;Therapeutic exercise;Patient/family education;Manual techniques;Scar mobilization;Passive range of motion;Manual lymph drainage;Electrical Stimulation;Aquatic Therapy;Energy conservation;Gait training;Stair training;Functional mobility training;DME Instruction;Balance training;Orthotic Fit/Training    PT Next Visit Plan  any ITB stretches, continue LE strengthening in seated/supine gait training, assess stairs. sit>stands from higher mat table. standing balance in // bars. any update with aquatic therapy?    PT Home Exercise Plan  [id number]    Consulted and Agree with Plan of Care  Patient       Patient will benefit from skilled therapeutic intervention in order to improve the following deficits and impairments:  Decreased range of motion, Decreased endurance, Decreased activity tolerance, Abnormal gait, Difficulty walking, Decreased coordination, Decreased balance, Decreased strength, Pain, Decreased mobility, Decreased safety awareness  Visit Diagnosis: Muscle weakness (generalized)  Other lack of coordination  Unsteadiness on feet  Other symptoms and signs involving the musculoskeletal system     Problem List Patient Active Problem List   Diagnosis Date Noted  . Osteoarthritis of right knee 08/23/2018  . Constipation due to opioid therapy 03/30/2018  . Retinopathy of both eyes 01/06/2018  . SNHL (sensorineural hearing loss) 12/04/2017  . Abnormal urinary stream 12/03/2017  . Osteoarthritis of carpometacarpal (CMC) joint of thumb 11/30/2017  . Muscle weakness 11/17/2017  . Transient vision disturbance 11/12/2017  . Bilateral hand pain 10/30/2017  . Pain of left hip joint 10/09/2017  . Gynecomastia 07/10/2017  . Iron deficiency anemia 07/05/2017  . Spasticity 05/20/2017  . Lumbar radiculitis 04/20/2017  . Ulnar  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, left 11/28/2016  . Idiopathic peripheral neuropathy 11/28/2016  . 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 Long term (current) use of anticoagulants 08/23/2015  . Right upper quadrant abdominal pain 08/23/2015  . Memory loss 05/10/2015  . Gait abnormality 04/07/2015  . Alkaline phosphatase elevation 04/07/2015  . History of thrombosis 03/26/2015  . Medial epicondylitis 02/07/2015  . Cognitive decline 12/21/2014  . Leukopenia 12/05/2014  . Rotator cuff syndrome of right shoulder 10/27/2014  . Status post left knee replacement 08/22/2014  . Left lateral epicondylitis 08/22/2014  . Chronic cerebral ischemia 08/18/2014  . Arthrofibrosis of knee joint 08/17/2014  . Cubital canal compression syndrome, left 08/17/2014  . 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 Chronic pain syndrome 04/10/2014  . Insomnia 04/10/2014  . Chronic non-specific white matter lesions on MRI 04/10/2014  . CFS (chronic fatigue syndrome) 04/10/2014  . 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 Biceps tendonitis on left 03/01/2014  . Polycythemia vera ([place]) 12/27/2013  . H/O TIA (transient ischemic attack) and stroke 12/27/2013  . Neck pain 12/27/2013  . OSA (obstructive sleep apnea) 12/08/2013  . 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 Depression with anxiety 08/04/2013  . Protein C deficiency ([place]) 08/01/2013  . Post traumatic stress disorder (PTSD) 08/01/2013  . Speech  abnormality 07/25/2013  . Obesity 05/10/2013  . Lower extremity edema 05/10/2013  . GERD (gastroesophageal reflux disease) 05/10/2013  . Arthritis 05/10/2013  . OA (osteoarthritis) of knee 03/15/2013  . Headache 10/25/2012  . Palpitations 10/18/2012  . Fatty liver determined by biopsy 06/01/2012  . Arthropathic psoriasis, unspecified ([place]) 04/29/2012  . Abnormal liver enzymes 03/29/2012  . Psoriatic arthritis ([place]) 12/29/2011  . Left Renal Hydronephrosis 12/11/2010  . 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 Celiac disease 05/27/2010  . Thyroid nodule 05/27/2010  . Male-to-male transgender person 09/20/2002    Arliss Journey, PT, DPT  03/21/2019, 10:45 AM  [facility] [address] [place], [place], [address] Phone: [phone]   Fax:  [phone]  Name: Leotis Isham MRN: [id number] Date of Birth: 12-02-1965

## 2019-03-22 ENCOUNTER — Ambulatory Visit (INDEPENDENT_AMBULATORY_CARE_PROVIDER_SITE_OTHER): Payer: BC Managed Care – PPO | Admitting: Psychology

## 2019-03-22 DIAGNOSIS — F4323 Adjustment disorder with mixed anxiety and depressed mood: Secondary | ICD-10-CM

## 2019-03-23 ENCOUNTER — Other Ambulatory Visit: Payer: Self-pay

## 2019-03-23 ENCOUNTER — Encounter: Payer: Self-pay | Admitting: Occupational Therapy

## 2019-03-23 ENCOUNTER — Ambulatory Visit: Payer: BC Managed Care – PPO | Admitting: Occupational Therapy

## 2019-03-23 DIAGNOSIS — R29818 Other symptoms and signs involving the nervous system: Secondary | ICD-10-CM

## 2019-03-23 DIAGNOSIS — R29898 Other symptoms and signs involving the musculoskeletal system: Secondary | ICD-10-CM

## 2019-03-23 DIAGNOSIS — R278 Other lack of coordination: Secondary | ICD-10-CM

## 2019-03-23 DIAGNOSIS — R2689 Other abnormalities of gait and mobility: Secondary | ICD-10-CM

## 2019-03-23 DIAGNOSIS — M79641 Pain in right hand: Secondary | ICD-10-CM

## 2019-03-23 NOTE — Therapy (Signed)
[facility] [address] [place], [place], [address] Phone: [phone]   Fax:  [phone]  Occupational Therapy Treatment  Patient Details  Name: Jesse Bowers MRN: [id number] Date of Birth: September 04, 1965 Referring Provider (OT): Dr. [name]   Encounter Date: 03/23/2019  OT End of Session - 03/23/19 1445    Visit Number  3    Number of Visits  13    Date for OT Re-Evaluation  04/23/19    Authorization Type  BC/BS primary, MCR secondary (however may lose part B) - 30 visit limit combined PT/OT (Same day counts as 1 visit)    Authorization Time Period  Plan goes from 11/2018-05/01/19    Authorization - Visit Number  3    Authorization - Number of Visits  15    OT Start Time  1325    OT Stop Time  1410    OT Time Calculation (min)  45 min    Activity Tolerance  Patient tolerated treatment well    Behavior During Therapy  [facility] for tasks assessed/performed       Past Medical History:  Diagnosis Date  . Abnormal weight loss   . Anxiety   . Arthritis   . Cataract    OU  . Celiac disease   . Cervical neck pain with evidence of disc disease    patient has a cyst   . Chronic constipation   . Chronic diastolic heart failure ([place])    Pt. denies  . Chronic pain   . Degenerative disc disease at L5-S1 level    with stenosis  . DVT (deep venous thrombosis) (H[facility])    Right upper arm, bilateral leg  . Eczema    inguinal, feet  . Elevated liver enzymes   . 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 Family history of adverse reaction to anesthesia    family has problems with anesthesia of nausea and vomiting   . 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 GERD (gastroesophageal reflux disease)    History of in 20's  . Gluten enteropathy   . H/O parotitis    right   . Hard of hearing   . History of kidney stones   . History of retinal tear    Bilateral  . History of staph infection    required wound vac  . Hx-TIA  (transient ischemic attack)    2015  . LVH (left ventricular hypertrophy) 12/15/2016   Mild, noted on ECHO  . MVP (mitral valve prolapse)   . NAFL (nonalcoholic fatty liver)   . Neck pain   . Neuromuscular disorder ([place])    bilateral neuropathy feet.  . Pneumonia 12/17/2010  . Polycythemia   . Polycythemia, secondary   . PONV (postoperative nausea and vomiting)   . Protein C deficiency ([place])    Dr. Anne Fu  . Psoriasis    16 X10 cm psoriatic rash on sole of left foot ; open and occ scant bleeding;   . psoriatic arthritis   . PTSD (post-traumatic stress disorder)   . Scaphoid fracture of wrist 09/23/2013  . Seizure ([place])    childhood, medication until age 8 then weanned completely off  . Sleep apnea    split night study last done by Dr. [name] 06/18/15 shows severe OSA, CSA, and hypersomnia, rec bipap  . Splenomegaly   . Stenosis of ureteropelvic junction (UPJ)    left  . Stroke [facility])    CVA vs TIA in left cerebrum causing  slight right sided weakness-Dr. [name] follows  . Syrinx of spinal cord ([place]) 01/06/2014   c spine on MRI  . Tachycardia    hx of   . Transfusion history    past history- none recent, after surgeries due to blood loss  . Wears glasses   . Wears hearing aid     Past Surgical History:  Procedure Laterality Date  . ABDOMINAL HYSTERECTOMY Bilateral 1994   TAH, BSO- tranverse incision at 54 yo  . ANKLE ARTHROSCOPY WITH RECONSTRUCTION Right 2007  . CHOLECYSTECTOMY     laparoscopic  . COLONOSCOPY     x3  . EYE SURGERY     Left eye 03/02/2018, right 02/15/2018  . HIP ARTHROSCOPY W/ LABRAL REPAIR Right 05/11/2013   acetabular labral tear 03/30/2013  . KNEE ARTHROPLASTY Right   . KNEE JOINT MANIPULATION Left    x3 under anesthesia  . KNEE SURGERY Bilateral 1984   Right ACL, left PCL repair  . LITHOTRIPSY  2005  . LIVER BIOPSY  2013   normal results.  [name] MASTECTOMY Bilateral    prior to 2009  . MOUTH SURGERY    . NASAL SEPTUM SURGERY N/A  09/20/2015   by ENT Dr. [name]  . OVARIAN CYST SURGERY Left    size of grapefruit, was informed that she had shortened vagina  . SHOULDER SURGERY Bilateral    Right 08/15/2016, Left 11/15/2016  . THUMB ARTHROSCOPY Left   . THYROIDECTOMY, PARTIAL Left 2008  . TOTAL KNEE ARTHROPLASTY Right 08/23/2018   Procedure: TOTAL KNEE ARTHROPLASTY;  Surgeon: [name], MD;  Location: [facility];  Service: Orthopedics;  Laterality: Right;  14mn  . TOTAL KNEE REVISION Left 02/06/2016   Procedure: LEFT TOTAL KNEE REVISION;  Surgeon: FGaynelle Arabian MD;  Location: [facility];  Service: Orthopedics;  Laterality: Left;  . TOTAL KNEE REVISION Left 04/22/2017   Procedure: Left knee polyethylene revision;  Surgeon: AGaynelle Arabian MD;  Location: [facility];  Service: Orthopedics;  Laterality: Left;  . UPPER GI ENDOSCOPY  2003    There were no vitals filed for this visit.  Subjective Assessment - 03/23/19 1328    Subjective   Pt reports that R hand pain/swelling improved.    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 PMH: Psoriatic arthritis, CVA, s/p Rt TKR 08/23/18, Lt knee revision 04/2017, lumbar radiculitis, multiple scaphoid fx's Rt thumb    Limitations  fall risk    Patient Stated Goals  cut my food, holding fork and pen/pencil better w/ less pain in Rt hand    Currently in Pain?  Yes    Pain Score  3     Pain Location  Hand    Pain Orientation  Right    Pain Descriptors / Indicators  Aching    Pain Type  Acute pain    Pain Onset  More than a month ago    Pain Frequency  Constant    Aggravating Factors   overuse, wrist flex, add/abduction of thumb    Pain Relieving Factors  rest, heat          Assessed pt's previous thumb spica splints for each hand (pt has multiple).  Attempted to modify previous L custom splint for incr comfort, but unable to obtain good fit.  Therefore, will plan to fabricate new custom L thumb spica splint next visit.  Attempted to make  modifications to R pre-fab splint due to incr pressure area/rubbing (added padding), but continues to  have poor fit/causes pt discomfort.  Adjusted R previous custom splint to reduce pressure areas, adjust strapping, and add padding with improved fit, but may need additional modification vs. New splint, particularly as pt continues to have R hand swelling from recent fall.--Recommended pt trial with adjustments, but remove if swelling/pain increases or pt has pressure areas.  Pt verbalized understanding.           OT Short Term Goals - 03/08/19 1530      OT SHORT TERM GOAL #1   Title  Independent with UE strengthening HEP (shoulders and hands)    Time  3    Period  Weeks    Status  New      OT SHORT TERM GOAL #2   Title  Pt to verbalize understanding with potential A/E needs to increase ease and independence with ADLS (specifically cutting food, writing)    Time  3    Period  Weeks    Status  New      OT SHORT TERM GOAL #3   Title  Pt to be assessed for potential splints to help reduce pain Rt thumb and Lt thumb prn    Time  3    Period  Weeks    Status  New        OT Long Term Goals - 03/08/19 1533      OT LONG TERM GOAL #1   Title  Pt to report greater ease with BADLS using A/E and compensatory strategies prn    Time  6    Period  Weeks    Status  New      OT LONG TERM GOAL #2   Title  Pt to improve bilateral grip strength by 5 lbs for gripping activities    Time  6    Period  Weeks    Status  New      OT LONG TERM GOAL #3   Title  Pt to perform snack prep/sandwich prep from standing level w/ rest breaks prn safely w/o LOB    Time  6    Period  Weeks    Status  New            Plan - 03/23/19 1447    Clinical Impression Statement  Pt reports that R hand pain/edema improved and that he has been able to do yellow putty HEP for last couple days.  Pt reports incr comfort with R CMC (previous custom splint) adjustments, but may still need new splint (as pt  still has swelling from recent injury).  Unable to obtain good fit/comfort with Previous splints for L hand; therfore will plan to fabricate new splint next visit.    Occupational performance deficits (Please refer to evaluation for details):  ADL's;IADL's;Leisure;Social Participation    Body Structure / Function / Physical Skills  ADL;Dexterity;ROM;IADL;Body mechanics;Mobility;Coordination;FMC;Pain;UE functional use;Decreased knowledge of use of DME;Sensation    Rehab Potential  Good   for goals   Clinical Decision Making  Several treatment options, min-mod task modification necessary    Comorbidities Affecting Occupational Performance:  Presence of comorbidities impacting occupational performance    Comorbidities impacting occupational performance description:  psoriatic arthritis, lumbar radiculitis    Modification or Assistance to Complete Evaluation   Min-Moderate modification of tasks or assist with assess necessary to complete eval    OT Frequency  2x / week    OT Duration  6 weeks   plus eval (or 12 visits over extended weeks d/t potential scheduling  conflicts)   OT Treatment/Interventions  Self-care/ADL training;Therapeutic exercise;Functional Mobility Training;Aquatic Therapy;Neuromuscular education;Manual Therapy;Splinting;Therapeutic activities;Coping strategies training;Energy conservation;DME and/or AE instruction;Passive range of motion;Patient/family education;Moist Heat    Plan  fabricate new custom L hand short thumb spica splint. initiate HEP for UE strength in shoulders and putty HEP(review/update)    Consulted and Agree with Plan of Care  Patient       Patient will benefit from skilled therapeutic intervention in order to improve the following deficits and impairments:   Body Structure / Function / Physical Skills: ADL, Dexterity, ROM, IADL, Body mechanics, Mobility, Coordination, FMC, Pain, UE functional use, Decreased knowledge of use of DME, Sensation       Visit  Diagnosis: Other lack of coordination  Other symptoms and signs involving the musculoskeletal system  Pain in right hand  Other abnormalities of gait and mobility  Other symptoms and signs involving the nervous system    Problem List Patient Active Problem List   Diagnosis Date Noted  . Osteoarthritis of right knee 08/23/2018  . Constipation due to opioid therapy 03/30/2018  . Retinopathy of both eyes 01/06/2018  . SNHL (sensorineural hearing loss) 12/04/2017  . Abnormal urinary stream 12/03/2017  . Osteoarthritis of carpometacarpal (CMC) joint of thumb 11/30/2017  . Muscle weakness 11/17/2017  . Transient vision disturbance 11/12/2017  . Bilateral hand pain 10/30/2017  . Pain of left hip joint 10/09/2017  . Gynecomastia 07/10/2017  . Iron deficiency anemia 07/05/2017  . Spasticity 05/20/2017  . Lumbar radiculitis 04/20/2017  . Ulnar 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, left 11/28/2016  . Idiopathic peripheral neuropathy 11/28/2016  . 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 Long term (current) use of anticoagulants 08/23/2015  . Right upper quadrant abdominal pain 08/23/2015  . Memory loss 05/10/2015  . Gait abnormality 04/07/2015  . Alkaline phosphatase elevation 04/07/2015  . History of thrombosis 03/26/2015  . Medial epicondylitis 02/07/2015  . Cognitive decline 12/21/2014  . Leukopenia 12/05/2014  . Rotator cuff syndrome of right shoulder 10/27/2014  . Status post left knee replacement 08/22/2014  . Left lateral epicondylitis 08/22/2014  . Chronic cerebral ischemia 08/18/2014  . Arthrofibrosis of knee joint 08/17/2014  . Cubital canal compression syndrome, left 08/17/2014  . 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 Chronic pain syndrome 04/10/2014  . Insomnia 04/10/2014  . Chronic non-specific white matter lesions on MRI 04/10/2014  . CFS (chronic fatigue syndrome) 04/10/2014  . 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 Biceps tendonitis on left 03/01/2014  .  Polycythemia vera ([place]) 12/27/2013  . H/O TIA (transient ischemic attack) and stroke 12/27/2013  . Neck pain 12/27/2013  . OSA (obstructive sleep apnea) 12/08/2013  . 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 Depression with anxiety 08/04/2013  . Protein C deficiency ([place]) 08/01/2013  . Post traumatic stress disorder (PTSD) 08/01/2013  . Speech abnormality 07/25/2013  . Obesity 05/10/2013  . Lower extremity edema 05/10/2013  . GERD (gastroesophageal reflux disease) 05/10/2013  . Arthritis 05/10/2013  . OA (osteoarthritis) of knee 03/15/2013  . Headache 10/25/2012  . Palpitations 10/18/2012  . Fatty liver determined by biopsy 06/01/2012  . Arthropathic psoriasis, unspecified ([place]) 04/29/2012  . Abnormal liver enzymes 03/29/2012  . Psoriatic arthritis ([place]) 12/29/2011  . Left Renal Hydronephrosis 12/11/2010  . 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 Celiac disease 05/27/2010  . Thyroid nodule 05/27/2010  . Male-to-male transgender person 09/20/2002    [facility] 03/23/2019, 3:03 PM  [facility] [address] [place], [place], [address]  Phone: [phone]   Fax:  [phone]  Name: Jesse Bowers MRN: [id number] Date of Birth: 29-Oct-1965   Vianne Bulls, OTR/L [facility] [address] [place], [place]  [address] [phone] phone [phone] 03/23/19 3:12 PM

## 2019-03-24 ENCOUNTER — Ambulatory Visit (INDEPENDENT_AMBULATORY_CARE_PROVIDER_SITE_OTHER): Payer: BC Managed Care – PPO | Admitting: Psychology

## 2019-03-24 DIAGNOSIS — F4323 Adjustment disorder with mixed anxiety and depressed mood: Secondary | ICD-10-CM

## 2019-03-25 ENCOUNTER — Encounter: Payer: Self-pay | Admitting: Physical Therapy

## 2019-03-25 ENCOUNTER — Other Ambulatory Visit: Payer: Self-pay

## 2019-03-25 ENCOUNTER — Other Ambulatory Visit: Payer: Self-pay | Admitting: Family Medicine

## 2019-03-25 ENCOUNTER — Telehealth: Payer: Self-pay | Admitting: Family Medicine

## 2019-03-25 ENCOUNTER — Ambulatory Visit: Payer: BC Managed Care – PPO | Admitting: Physical Therapy

## 2019-03-25 DIAGNOSIS — N1330 Unspecified hydronephrosis: Secondary | ICD-10-CM

## 2019-03-25 DIAGNOSIS — R2689 Other abnormalities of gait and mobility: Secondary | ICD-10-CM

## 2019-03-25 DIAGNOSIS — R278 Other lack of coordination: Secondary | ICD-10-CM

## 2019-03-25 DIAGNOSIS — R29898 Other symptoms and signs involving the musculoskeletal system: Secondary | ICD-10-CM

## 2019-03-25 DIAGNOSIS — R29818 Other symptoms and signs involving the nervous system: Secondary | ICD-10-CM

## 2019-03-25 DIAGNOSIS — N135 Crossing vessel and stricture of ureter without hydronephrosis: Secondary | ICD-10-CM

## 2019-03-25 NOTE — Therapy (Signed)
[facility] [address] [place], [place], [address] Phone: [phone]   Fax:  [phone]  Physical Therapy Treatment  Patient Details  Name: Bowers Bowers MRN: [id number] Date of Birth: 1965/12/02 Referring Provider (PT): Clerance Lav, MD   Encounter Date: 03/25/2019  PT End of Session - 03/25/19 1209    Visit Number  4    Number of Visits  13    Date for PT Re-Evaluation  05/08/19    Authorization Type  [employer] primary -September 2020-Feb 28th 2021, [facility] secondary (however may lose part B) - 30 visit limit combined PT/OT (Same day counts as 1 visit)    Authorization - Visit Number  4    Authorization - Number of Visits  30    PT Start Time  0806    PT Stop Time  0847    PT Time Calculation (min)  41 min    Equipment Utilized During Treatment  Gait belt    Activity Tolerance  Patient tolerated treatment well    Behavior During Therapy  [facility] for tasks assessed/performed   verbose, needs re-direction at times      Past Medical History:  Diagnosis Date  . Abnormal weight loss   . Anxiety   . Arthritis   . Cataract    OU  . Celiac disease   . Cervical neck pain with evidence of disc disease    patient has a cyst   . Chronic constipation   . Chronic diastolic heart failure ([place])    Pt. denies  . Chronic pain   . Degenerative disc disease at L5-S1 level    with stenosis  . DVT (deep venous thrombosis) (HCC)    Right upper arm, bilateral leg  . Eczema    inguinal, feet  . Elevated liver enzymes   . 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 Family history of adverse reaction to anesthesia    family has problems with anesthesia of nausea and vomiting   . 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 GERD (gastroesophageal reflux disease)    History of in 20's  . Gluten enteropathy   . H/O parotitis    right   . Hard of hearing   . History of kidney stones   . History of retinal tear    Bilateral  .  History of staph infection    required wound vac  . Hx-TIA (transient ischemic attack)    2015  . LVH (left ventricular hypertrophy) 12/15/2016   Mild, noted on ECHO  . MVP (mitral valve prolapse)   . NAFL (nonalcoholic fatty liver)   . Neck pain   . Neuromuscular disorder ([place])    bilateral neuropathy feet.  . Pneumonia 12/17/2010  . Polycythemia   . Polycythemia, secondary   . PONV (postoperative nausea and vomiting)   . Protein C deficiency ([place])    Dr. Anne Fu  . Psoriasis    16 X10 cm psoriatic rash on sole of left foot ; open and occ scant bleeding;   . psoriatic arthritis   . PTSD (post-traumatic stress disorder)   . Scaphoid fracture of wrist 09/23/2013  . Seizure ([place])    childhood, medication until age 89 then weanned completely off  . Sleep apnea    split night study last done by Dr. [name] 06/18/15 shows severe OSA, CSA, and hypersomnia, rec bipap  . Splenomegaly   . Stenosis of ureteropelvic junction (UPJ)    left  . Stroke [facility])  CVA vs TIA in left cerebrum causing slight right sided weakness-Dr. [name] follows  . 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 Tachycardia    hx of   . Transfusion history    past history- none recent, after surgeries due to blood loss  . Wears glasses   . Wears hearing aid     Past Surgical History:  Procedure Laterality Date  . ABDOMINAL HYSTERECTOMY Bilateral 1994   TAH, BSO- tranverse incision at 54 yo  . ANKLE ARTHROSCOPY WITH RECONSTRUCTION Right 2007  . CHOLECYSTECTOMY     laparoscopic  . COLONOSCOPY     x3  . EYE SURGERY     Left eye 03/02/2018, right 02/15/2018  . HIP ARTHROSCOPY W/ LABRAL REPAIR Right 05/11/2013   acetabular labral tear 03/30/2013  . KNEE ARTHROPLASTY Right   . KNEE JOINT MANIPULATION Left    x3 under anesthesia  . KNEE SURGERY Bilateral 1984   Right ACL, left PCL repair  . LITHOTRIPSY  2005  . LIVER BIOPSY  2013   normal results.  [name] MASTECTOMY Bilateral    prior to  2009  . MOUTH SURGERY    . NASAL SEPTUM SURGERY N/A 09/20/2015   by ENT Dr. [name]  . OVARIAN CYST SURGERY Left    size of grapefruit, was informed that she had shortened vagina  . SHOULDER SURGERY Bilateral    Right 08/15/2016, Left 11/15/2016  . THUMB ARTHROSCOPY Left   . THYROIDECTOMY, PARTIAL Left 2008  . TOTAL KNEE ARTHROPLASTY Right 08/23/2018   Procedure: TOTAL KNEE ARTHROPLASTY;  Surgeon: [name], MD;  Location: [facility];  Service: Orthopedics;  Laterality: Right;  78mn  . TOTAL KNEE REVISION Left 02/06/2016   Procedure: LEFT TOTAL KNEE REVISION;  Surgeon: FGaynelle Arabian MD;  Location: [facility];  Service: Orthopedics;  Laterality: Left;  . TOTAL KNEE REVISION Left 04/22/2017   Procedure: Left knee polyethylene revision;  Surgeon: AGaynelle Arabian MD;  Location: [facility];  Service: Orthopedics;  Laterality: Left;  . UPPER GI ENDOSCOPY  2003    There were no vitals filed for this visit.  Subjective Assessment - 03/25/19 0809    Subjective  R kidney has been painful, pt states it feels like kidney stones (goes to have an ultrasound next Wednesday). No falls. Exercises going well. 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 PMH: Psoriatic arthritis, Polycythemia vera, CVA, s/p Rt TKR 08/23/18, Lt knee revision 04/2017, lumbar radiculitis, multiple scaphoid fx's Rt thumb, chronic pain    How long can you walk comfortably?  can walk around the house - furniture walking    Diagnostic tests  MRI cervical spine 07/2017: Unchanged C4 through C6 cervical spinal cord syrinx since the MRIin March 2016, and stable by report since October 2015., MRI brain 2019: No acute intracranial abnormality and stable noncontrast MRI appearance of the brain since 2017.    Patient Stated Goals  can't get up without using his hands, wants to be able to get out of his chair without using his hands - less pain in legs and back, be stronger.                        OEdenAdult PT Treatment/Exercise - 03/25/19 0001      Transfers   Sit to Stand  4: Min guard;5: Supervision    Sit to  Stand Details  Verbal cues for sequencing;Verbal cues for technique;Verbal cues for precautions/safety    Sit to Stand Details (indicate cue type and reason)  from mat table throughout session10 reps, cues for hand placement and anterior weight shift - plus additional reps throughout session    Stand to Sit  5: Supervision    Stand to Sit Details (indicate cue type and reason)  Verbal cues for sequencing;Verbal cues for technique    Comments  In standing at edge of mat with BUE support on RW: 3 x 5 reps mini squats with cues for technique and glute activation       High Level Balance   High Level Balance Comments  At edge of mat table with RW after sit to stand reps: standing balance with no UE support and feet hip width distance: 2 x 5 reps head turns, 2 x 5 reps head nods, min guard for safety.       Therapeutic Activites    Therapeutic Activities  Other Therapeutic Activities    Other Therapeutic Activities  provided pt handout and explained handout for aquatic therapy - pt to start on Monday, pt verbalized understanding      Knee/Hip Exercises: Stretches   Passive Hamstring Stretch  2 reps;30 seconds    Passive Hamstring Stretch Limitations  with therapist performing with additional calf stretch - performed supine on mat, B            PT Education - 03/25/19 1209    Education Details  handout for aquatic therapy    Person(s) Educated  Patient    Methods  Explanation;Handout    Comprehension  Verbalized understanding       PT Short Term Goals - 03/09/19 1302      PT SHORT TERM GOAL #1   Title  Pt will be independent with initial HEP for improved strength, balance, and gait. ALL STGS DUE 04/06/19    Time  4   due to delay in scheduling   Period  Weeks    Status  New    Target Date  04/06/19      PT SHORT TERM GOAL  #2   Title  Patient will undergo further assessment of TUG with use of RW vs. BERG  in order to determine fall risk - goal written as appropriate.    Baseline  not yet assessed.    Time  4    Period  Weeks    Status  New      PT SHORT TERM GOAL #3   Title  Patient will verbalize understanding of fall prevention strategies in the home.    Time  4    Period  Weeks    Status  New      PT SHORT TERM GOAL #4   Title  Patient will ambulate at least 35' with RW with supervision in order to safely ambulate at home.    Baseline  not yet assessed.    Time  4    Period  Weeks    Status  New      PT SHORT TERM GOAL #5   Title  Patient will perform 4 steps with supervision using single railing and step to pattern in order to increase safety at home with stairs.    Baseline  not yet assessed.    Time  4    Period  Weeks    Status  New      Additional Short Term Goals  Additional Short Term Goals  Yes      PT SHORT TERM GOAL #6   Title  Perform 5x sit to stand from standard arm chair vs. mat table and write goal as appropriate.    Time  4    Period  Weeks    Status  New        PT Long Term Goals - 03/09/19 1306      PT LONG TERM GOAL #1   Title  Pt will be independent with final HEP for land/aquatic therapy for improved strength, ROM, balance, and gait. ALL LTGS DUE 05/04/19    Time  8   due to delay in scheduling   Period  Weeks    Status  New    Target Date  05/04/19      PT LONG TERM GOAL #2   Title  BERG vs. TUG score to be written as appropriate to determine fall risk.    Time  8    Period  Weeks    Status  New      PT LONG TERM GOAL #3   Title  Patient will ambulate at least 100' with RW with mod I in order to safely ambulate at home.    Baseline  not yet assessed.    Time  8    Period  Weeks    Status  New      PT LONG TERM GOAL #4   Title  Patient will perform 12 steps with supervision using single railing and step to pattern in order to increase safety at home  with stairs.    Baseline  not yet assessed.    Time  8    Period  Weeks    Status  New      PT LONG TERM GOAL #5   Title  Patient will perform bed mobility, stap step transfers with RW with mod I in order to decrease caregiver burden.    Baseline  not yet assessed.    Time  8    Period  Weeks    Status  New      PT LONG TERM GOAL #6   Title  5x sit <> stand goal to be written as appropriate.    Time  8    Period  Weeks    Status  New            Plan - 03/25/19 1213    Clinical Impression Statement  Focus of today's skilled session was functional LE strengthening - including sit <> stands and mini squats, hamstring ROM, and standing balance with no UE support. Pt tolerated session well, needed cues for proper technique. Provided pt with handout for aquatic therapy as he starts on Monday. Will continue to progress towards LTGs.    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 PMH: Psoriatic arthritis, Polycythemia vera, CVA, s/p Rt TKR 08/23/18, Lt knee revision 04/2017, lumbar radiculitis, multiple scaphoid fx's Rt thumb, chronic pain    Examination-Activity Limitations  Transfers;Stairs;Locomotion Level;Stand    Examination-Participation Restrictions  Community Activity    Stability/Clinical Decision Making  Evolving/Moderate complexity    Rehab Potential  Good    PT Frequency  2x / week    PT Duration  6 weeks    PT Treatment/Interventions  ADLs/Self Care Home Management;Neuromuscular re-education;Therapeutic activities;Therapeutic exercise;Patient/family education;Manual techniques;Scar mobilization;Passive range of motion;Manual lymph drainage;Electrical Stimulation;Aquatic Therapy;Energy conservation;Gait training;Stair  training;Functional mobility training;DME Instruction;Balance training;Orthotic Fit/Training    PT Next Visit Plan  any ITB stretches, continue LE strengthening in  seated/supine gait training, assess stairs. sit>stands from higher mat table. standing balance in // bars. any update with aquatic therapy?    PT Home Exercise Plan  [id number]    Consulted and Agree with Plan of Care  Patient       Patient will benefit from skilled therapeutic intervention in order to improve the following deficits and impairments:  Decreased range of motion, Decreased endurance, Decreased activity tolerance, Abnormal gait, Difficulty walking, Decreased coordination, Decreased balance, Decreased strength, Pain, Decreased mobility, Decreased safety awareness  Visit Diagnosis: Other lack of coordination  Other symptoms and signs involving the musculoskeletal system  Other abnormalities of gait and mobility  Other symptoms and signs involving the nervous system     Problem List Patient Active Problem List   Diagnosis Date Noted  . Osteoarthritis of right knee 08/23/2018  . Constipation due to opioid therapy 03/30/2018  . Retinopathy of both eyes 01/06/2018  . SNHL (sensorineural hearing loss) 12/04/2017  . Abnormal urinary stream 12/03/2017  . Osteoarthritis of carpometacarpal (CMC) joint of thumb 11/30/2017  . Muscle weakness 11/17/2017  . Transient vision disturbance 11/12/2017  . Bilateral hand pain 10/30/2017  . Pain of left hip joint 10/09/2017  . Gynecomastia 07/10/2017  . Iron deficiency anemia 07/05/2017  . Spasticity 05/20/2017  . Lumbar radiculitis 04/20/2017  . Ulnar 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, left 11/28/2016  . Idiopathic peripheral neuropathy 11/28/2016  . 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 Long term (current) use of anticoagulants 08/23/2015  . Right upper quadrant abdominal pain 08/23/2015  . Memory loss 05/10/2015  . Gait abnormality 04/07/2015  . Alkaline phosphatase elevation 04/07/2015  . History of thrombosis 03/26/2015  . Medial epicondylitis 02/07/2015  . Cognitive decline 12/21/2014  . Leukopenia 12/05/2014  . Rotator  cuff syndrome of right shoulder 10/27/2014  . Status post left knee replacement 08/22/2014  . Left lateral epicondylitis 08/22/2014  . Chronic cerebral ischemia 08/18/2014  . Arthrofibrosis of knee joint 08/17/2014  . Cubital canal compression syndrome, left 08/17/2014  . 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 Chronic pain syndrome 04/10/2014  . Insomnia 04/10/2014  . Chronic non-specific white matter lesions on MRI 04/10/2014  . CFS (chronic fatigue syndrome) 04/10/2014  . 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 Biceps tendonitis on left 03/01/2014  . Polycythemia vera ([place]) 12/27/2013  . H/O TIA (transient ischemic attack) and stroke 12/27/2013  . Neck pain 12/27/2013  . OSA (obstructive sleep apnea) 12/08/2013  . 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 Depression with anxiety 08/04/2013  . Protein C deficiency ([place]) 08/01/2013  . Post traumatic stress disorder (PTSD) 08/01/2013  . Speech abnormality 07/25/2013  . Obesity 05/10/2013  . Lower extremity edema 05/10/2013  . GERD (gastroesophageal reflux disease) 05/10/2013  . Arthritis 05/10/2013  . OA (osteoarthritis) of knee 03/15/2013  . Headache 10/25/2012  . Palpitations 10/18/2012  . Fatty liver determined by biopsy 06/01/2012  . Arthropathic psoriasis, unspecified ([place]) 04/29/2012  . Abnormal liver enzymes 03/29/2012  . Psoriatic arthritis ([place]) 12/29/2011  . Left Renal Hydronephrosis 12/11/2010  . 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 Celiac disease 05/27/2010  . Thyroid nodule 05/27/2010  . Male-to-male transgender person 09/20/2002    Arliss Journey, PT, DPT  03/25/2019, 12:25 PM  [facility] [address] [place], [place], [address] Phone: [phone]   Fax:  [phone]  Name: Tyheim Vanalstyne MRN: [id number] Date of Birth: 1966-02-22

## 2019-03-25 NOTE — Telephone Encounter (Signed)
Patient notified and order placed for RUS and appointment scheduled.

## 2019-03-25 NOTE — Telephone Encounter (Signed)
Gerrit Friends, CMA  I recommend that we have him have a RUS, stop the Myrbetriq and come in for an office visit next week.

## 2019-03-28 ENCOUNTER — Other Ambulatory Visit: Payer: Self-pay | Admitting: Family Medicine

## 2019-03-28 ENCOUNTER — Ambulatory Visit: Payer: BC Managed Care – PPO | Admitting: Physical Therapy

## 2019-03-28 ENCOUNTER — Encounter: Payer: Self-pay | Admitting: Physical Therapy

## 2019-03-28 ENCOUNTER — Other Ambulatory Visit: Payer: Self-pay

## 2019-03-28 DIAGNOSIS — R29898 Other symptoms and signs involving the musculoskeletal system: Secondary | ICD-10-CM | POA: Diagnosis not present

## 2019-03-28 DIAGNOSIS — M6281 Muscle weakness (generalized): Secondary | ICD-10-CM

## 2019-03-28 DIAGNOSIS — N644 Mastodynia: Secondary | ICD-10-CM

## 2019-03-28 DIAGNOSIS — R2689 Other abnormalities of gait and mobility: Secondary | ICD-10-CM

## 2019-03-28 NOTE — Therapy (Signed)
[facility] [address] [place], [place], [address] Phone: [phone]   Fax:  [phone]  Physical Therapy Treatment  Patient Details  Name: Jesse Bowers MRN: [id number] Date of Birth: 1965-12-15 Referring Provider (PT): Clerance Lav, MD   Encounter Date: 03/28/2019  PT End of Session - 03/28/19 2111    Visit Number  5    Number of Visits  13    Date for PT Re-Evaluation  05/08/19    Authorization Type  [employer] primary -September 2020-Feb 28th 2021, [facility] secondary (however may lose part B) - 30 visit limit combined PT/OT (Same day counts as 1 visit)    Authorization - Visit Number  5    Authorization - Number of Visits  30    PT Start Time  9211   pt arrived 10" late   PT Stop Time  1630    PT Time Calculation (min)  35 min    Equipment Utilized During Treatment  Other (comment)   pool noodle   Activity Tolerance  Patient tolerated treatment well    Behavior During Therapy  [facility] for tasks assessed/performed   verbose, needs re-direction at times      Past Medical History:  Diagnosis Date  . Abnormal weight loss   . Anxiety   . Arthritis   . Cataract    OU  . Celiac disease   . Cervical neck pain with evidence of disc disease    patient has a cyst   . Chronic constipation   . Chronic diastolic heart failure ([place])    Pt. denies  . Chronic pain   . Degenerative disc disease at L5-S1 level    with stenosis  . DVT (deep venous thrombosis) (HCC)    Right upper arm, bilateral leg  . Eczema    inguinal, feet  . Elevated liver enzymes   . 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 Family history of adverse reaction to anesthesia    family has problems with anesthesia of nausea and vomiting   . 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 GERD (gastroesophageal reflux disease)    History of in 20's  . Gluten enteropathy   . H/O parotitis    right   . Hard of hearing   . History of kidney stones   .  History of retinal tear    Bilateral  . History of staph infection    required wound vac  . Hx-TIA (transient ischemic attack)    2015  . LVH (left ventricular hypertrophy) 12/15/2016   Mild, noted on ECHO  . MVP (mitral valve prolapse)   . NAFL (nonalcoholic fatty liver)   . Neck pain   . Neuromuscular disorder ([place])    bilateral neuropathy feet.  . Pneumonia 12/17/2010  . Polycythemia   . Polycythemia, secondary   . PONV (postoperative nausea and vomiting)   . Protein C deficiency ([place])    Dr. Anne Fu  . Psoriasis    16 X10 cm psoriatic rash on sole of left foot ; open and occ scant bleeding;   . psoriatic arthritis   . PTSD (post-traumatic stress disorder)   . Scaphoid fracture of wrist 09/23/2013  . Seizure ([place])    childhood, medication until age 3 then weanned completely off  . Sleep apnea    split night study last done by Dr. [name] 06/18/15 shows severe OSA, CSA, and hypersomnia, rec bipap  . Splenomegaly   . Stenosis of ureteropelvic junction (UPJ)  left  . Stroke [facility])    CVA vs TIA in left cerebrum causing slight right sided weakness-Dr. [name] follows  . Syrinx of spinal cord ([place]) 01/06/2014   c spine on MRI  . Tachycardia    hx of   . Transfusion history    past history- none recent, after surgeries due to blood loss  . Wears glasses   . Wears hearing aid     Past Surgical History:  Procedure Laterality Date  . ABDOMINAL HYSTERECTOMY Bilateral 1994   TAH, BSO- tranverse incision at 54 yo  . ANKLE ARTHROSCOPY WITH RECONSTRUCTION Right 2007  . CHOLECYSTECTOMY     laparoscopic  . COLONOSCOPY     x3  . EYE SURGERY     Left eye 03/02/2018, right 02/15/2018  . HIP ARTHROSCOPY W/ LABRAL REPAIR Right 05/11/2013   acetabular labral tear 03/30/2013  . KNEE ARTHROPLASTY Right   . KNEE JOINT MANIPULATION Left    x3 under anesthesia  . KNEE SURGERY Bilateral 1984   Right ACL, left PCL repair  . LITHOTRIPSY  2005  . LIVER BIOPSY  2013   normal  results.  [name] MASTECTOMY Bilateral    prior to 2009  . MOUTH SURGERY    . NASAL SEPTUM SURGERY N/A 09/20/2015   by ENT Dr. [name]  . OVARIAN CYST SURGERY Left    size of grapefruit, was informed that she had shortened vagina  . SHOULDER SURGERY Bilateral    Right 08/15/2016, Left 11/15/2016  . THUMB ARTHROSCOPY Left   . THYROIDECTOMY, PARTIAL Left 2008  . TOTAL KNEE ARTHROPLASTY Right 08/23/2018   Procedure: TOTAL KNEE ARTHROPLASTY;  Surgeon: [name], MD;  Location: [facility];  Service: Orthopedics;  Laterality: Right;  62mn  . TOTAL KNEE REVISION Left 02/06/2016   Procedure: LEFT TOTAL KNEE REVISION;  Surgeon: FGaynelle Arabian MD;  Location: [facility];  Service: Orthopedics;  Laterality: Left;  . TOTAL KNEE REVISION Left 04/22/2017   Procedure: Left knee polyethylene revision;  Surgeon: AGaynelle Arabian MD;  Location: [facility];  Service: Orthopedics;  Laterality: Left;  . UPPER GI ENDOSCOPY  2003    There were no vitals filed for this visit.  Subjective Assessment - 03/28/19 2108    [facility]  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 PMH: Psoriatic arthritis, Polycythemia vera, CVA, s/p Rt TKR 08/23/18, Lt knee revision 04/2017, lumbar radiculitis, multiple scaphoid fx's Rt thumb, chronic pain    How long can you walk comfortably?  can walk around the house - furniture walking    Diagnostic tests  MRI cervical spine 07/2017: Unchanged C4 through C6 cervical spinal cord syrinx since the MRIin March 2016, and stable by report since October 2015., MRI brain 2019: No acute intracranial abnormality and stable noncontrast MRI appearance of the brain since 2017.    Patient Stated Goals  can't get up without using his hands, wants to be able to get out of his chair without using his hands - less pain in legs and back, be  stronger.    Currently in Pain?  Yes    Pain Score  3     Pain Location  Back    Pain Orientation  Lower    Pain Descriptors / Indicators  Aching    Pain Type  Chronic pain  Aquatic therapy at [facility] - pool temp. 87.4 degrees Patient seen for aquatic therapy today.  Treatment took place in water 2.5-4 feet deep depending upon activity.  Pt entered and exited  the pool via step negotiation with use of bil. Hand rails with SBA - step by step sequence  Pt gait trained in pool - 51mx 3 reps with use of blue noodle for UE support with cues to stand erect as possible  Buoyant ankle cuff used on each leg - 10 reps for hip strengthening - flexion, extension, abduction, hip and knee flexion with knee flexed  10 reps each leg each direction   Pt's posture noted to improve at end of session with pt standing more upright and with less crouched gait posture  Pt requires the bouyancy of water for support and for reduced fall risk with balance and unsupported standing activities; buoyancy of water also needed for  Spinal decompression for reduced pain with weight bearing activities.  Viscosity of water needed for resistance for strengthening exercises.                          PT Short Term Goals - 03/28/19 2120      PT SHORT TERM GOAL #1   Title  Pt will be independent with initial HEP for improved strength, balance, and gait. ALL STGS DUE 04/06/19    Time  4   due to delay in scheduling   Period  Weeks    Status  New    Target Date  04/06/19      PT SHORT TERM GOAL #2   Title  Patient will undergo further assessment of TUG with use of RW vs. BERG  in order to determine fall risk - goal written as appropriate.    Baseline  not yet assessed.    Time  4    Period  Weeks    Status  New      PT SHORT TERM GOAL #3   Title  Patient will verbalize understanding of fall prevention strategies in the home.    Time  4    Period  Weeks    Status  New      PT SHORT TERM  GOAL #4   Title  Patient will ambulate at least 555 with RW with supervision in order to safely ambulate at home.    Baseline  not yet assessed.    Time  4    Period  Weeks    Status  New      PT SHORT TERM GOAL #5   Title  Patient will perform 4 steps with supervision using single railing and step to pattern in order to increase safety at home with stairs.    Baseline  not yet assessed.    Time  4    Period  Weeks    Status  New      PT SHORT TERM GOAL #6   Title  Perform 5x sit to stand from standard arm chair vs. mat table and write goal as appropriate.    Time  4    Period  Weeks    Status  New        PT Long Term Goals - 03/28/19 2120      PT LONG TERM GOAL #1   Title  Pt will be independent with final HEP for land/aquatic therapy for improved strength, ROM, balance, and gait. ALL LTGS DUE 05/04/19  Time  8   due to delay in scheduling   Period  Weeks    Status  New      PT LONG TERM GOAL #2   Title  BERG vs. TUG score to be written as appropriate to determine fall risk.    Time  8    Period  Weeks    Status  New      PT LONG TERM GOAL #3   Title  Patient will ambulate at least 100' with RW with mod I in order to safely ambulate at home.    Baseline  not yet assessed.    Time  8    Period  Weeks    Status  New      PT LONG TERM GOAL #4   Title  Patient will perform 12 steps with supervision using single railing and step to pattern in order to increase safety at home with stairs.    Baseline  not yet assessed.    Time  8    Period  Weeks    Status  New      PT LONG TERM GOAL #5   Title  Patient will perform bed mobility, stap step transfers with RW with mod I in order to decrease caregiver burden.    Baseline  not yet assessed.    Time  8    Period  Weeks    Status  New      PT LONG TERM GOAL #6   Title  5x sit <> stand goal to be written as appropriate.    Time  8    Period  Weeks    Status  New            Plan - 03/28/19 2112    Clinical  Impression Statement  Pt tolerated aquatic session well with improved more upright posture with less crouched gait pattern noted during water walking at end of session; session of shorter duration today due to pt's late arrival, however, pt did report he felt his legs were stretched out at end of session which allowed him to stand more erect.    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 PMH: Psoriatic arthritis, Polycythemia vera, CVA, s/p Rt TKR 08/23/18, Lt knee revision 04/2017, lumbar radiculitis, multiple scaphoid fx's Rt thumb, chronic pain    Examination-Activity Limitations  Transfers;Stairs;Locomotion Level;Stand    Examination-Participation Restrictions  Community Activity    Stability/Clinical Decision Making  Evolving/Moderate complexity    Rehab Potential  Good    PT Frequency  2x / week    PT Duration  6 weeks    PT Treatment/Interventions  ADLs/Self Care Home Management;Neuromuscular re-education;Therapeutic activities;Therapeutic exercise;Patient/family education;Manual techniques;Scar mobilization;Passive range of motion;Manual lymph drainage;Electrical Stimulation;Aquatic Therapy;Energy conservation;Gait training;Stair training;Functional mobility training;DME Instruction;Balance training;Orthotic Fit/Training    PT Next Visit Plan  any ITB stretches, continue LE strengthening in seated/supine gait training, assess stairs. sit>stands from higher mat table. standing balance in // bars. any update with aquatic therapy?    PT Home Exercise Plan  [id number]    Consulted and Agree with Plan of Care  Patient       Patient will benefit from skilled therapeutic intervention in order to improve the following deficits and impairments:  Decreased range of motion, Decreased endurance, Decreased activity tolerance, Abnormal gait, Difficulty walking, Decreased coordination, Decreased balance, Decreased  strength, Pain, Decreased mobility, Decreased safety awareness  Visit Diagnosis: Other abnormalities of gait and mobility  Muscle weakness (generalized)     Problem List Patient Active Problem List   Diagnosis Date Noted  . Osteoarthritis of right knee 08/23/2018  . Constipation due to opioid therapy 03/30/2018  . Retinopathy of both eyes 01/06/2018  . SNHL (sensorineural hearing loss) 12/04/2017  . Abnormal urinary stream 12/03/2017  . Osteoarthritis of carpometacarpal (CMC) joint of thumb 11/30/2017  . Muscle weakness 11/17/2017  . Transient vision disturbance 11/12/2017  . Bilateral hand pain 10/30/2017  . Pain of left hip joint 10/09/2017  . Gynecomastia 07/10/2017  . Iron deficiency anemia 07/05/2017  . Spasticity 05/20/2017  . Lumbar radiculitis 04/20/2017  . Ulnar 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, left 11/28/2016  . Idiopathic peripheral neuropathy 11/28/2016  . 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 Long term (current) use of anticoagulants 08/23/2015  . Right upper quadrant abdominal pain 08/23/2015  . Memory loss 05/10/2015  . Gait abnormality 04/07/2015  . Alkaline phosphatase elevation 04/07/2015  . History of thrombosis 03/26/2015  . Medial epicondylitis 02/07/2015  . Cognitive decline 12/21/2014  . Leukopenia 12/05/2014  . Rotator cuff syndrome of right shoulder 10/27/2014  . Status post left knee replacement 08/22/2014  . Left lateral epicondylitis 08/22/2014  . Chronic cerebral ischemia 08/18/2014  . Arthrofibrosis of knee joint 08/17/2014  . Cubital canal compression syndrome, left 08/17/2014  . 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 Chronic pain syndrome 04/10/2014  . Insomnia 04/10/2014  . Chronic non-specific white matter lesions on MRI 04/10/2014  . CFS (chronic fatigue syndrome) 04/10/2014  . 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 Biceps tendonitis on left 03/01/2014  . Polycythemia vera ([place]) 12/27/2013  . H/O TIA (transient ischemic  attack) and stroke 12/27/2013  . Neck pain 12/27/2013  . OSA (obstructive sleep apnea) 12/08/2013  . 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 Depression with anxiety 08/04/2013  . Protein C deficiency ([place]) 08/01/2013  . Post traumatic stress disorder (PTSD) 08/01/2013  . Speech abnormality 07/25/2013  . Obesity 05/10/2013  . Lower extremity edema 05/10/2013  . GERD (gastroesophageal reflux disease) 05/10/2013  . Arthritis 05/10/2013  . OA (osteoarthritis) of knee 03/15/2013  . Headache 10/25/2012  . Palpitations 10/18/2012  . Fatty liver determined by biopsy 06/01/2012  . Arthropathic psoriasis, unspecified ([place]) 04/29/2012  . Abnormal liver enzymes 03/29/2012  . Psoriatic arthritis ([place]) 12/29/2011  . Left Renal Hydronephrosis 12/11/2010  . 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 Celiac disease 05/27/2010  . Thyroid nodule 05/27/2010  . Male-to-male transgender person 09/20/2002    Alda [name], [place], [place] 03/28/2019, 9:23 PM  [facility] [address] [place], [place], [address] Phone: [phone]   Fax:  [phone]  Name: Jesse Bowers MRN: [id number] Date of Birth: 30-Jul-1965

## 2019-03-29 ENCOUNTER — Ambulatory Visit (INDEPENDENT_AMBULATORY_CARE_PROVIDER_SITE_OTHER): Payer: BC Managed Care – PPO | Admitting: Psychology

## 2019-03-29 ENCOUNTER — Ambulatory Visit: Payer: BC Managed Care – PPO | Admitting: Occupational Therapy

## 2019-03-29 ENCOUNTER — Encounter: Payer: Self-pay | Admitting: Hematology

## 2019-03-29 ENCOUNTER — Ambulatory Visit: Payer: BC Managed Care – PPO | Admitting: Physical Therapy

## 2019-03-29 DIAGNOSIS — F4312 Post-traumatic stress disorder, chronic: Secondary | ICD-10-CM | POA: Diagnosis not present

## 2019-03-30 ENCOUNTER — Other Ambulatory Visit: Payer: Self-pay

## 2019-03-30 ENCOUNTER — Ambulatory Visit
Admission: RE | Admit: 2019-03-30 | Discharge: 2019-03-30 | Disposition: A | Discharge status: Home / Self Care | Payer: BC Managed Care – PPO | Source: Ambulatory Visit | Attending: Urology | Admitting: Urology

## 2019-03-30 ENCOUNTER — Ambulatory Visit: Payer: BC Managed Care – PPO | Admitting: Occupational Therapy

## 2019-03-30 ENCOUNTER — Ambulatory Visit: Payer: BC Managed Care – PPO | Admitting: Physical Therapy

## 2019-03-30 VITALS — BP 114/83 | HR 93

## 2019-03-30 DIAGNOSIS — N135 Crossing vessel and stricture of ureter without hydronephrosis: Secondary | ICD-10-CM | POA: Diagnosis not present

## 2019-03-30 DIAGNOSIS — R278 Other lack of coordination: Secondary | ICD-10-CM

## 2019-03-30 DIAGNOSIS — M6281 Muscle weakness (generalized): Secondary | ICD-10-CM

## 2019-03-30 DIAGNOSIS — N1330 Unspecified hydronephrosis: Secondary | ICD-10-CM | POA: Diagnosis present

## 2019-03-30 DIAGNOSIS — R2689 Other abnormalities of gait and mobility: Secondary | ICD-10-CM

## 2019-03-30 DIAGNOSIS — R29898 Other symptoms and signs involving the musculoskeletal system: Secondary | ICD-10-CM

## 2019-03-30 NOTE — Therapy (Signed)
[facility] [address] [place], [place], [address] Phone: [phone]   Fax:  [phone]  Physical Therapy Treatment  Patient Details  Name: Jesse Bowers MRN: [id number] Date of Birth: 1965/05/27 Referring Provider (PT): Clerance Lav, MD   Encounter Date: 03/30/2019  PT End of Session - 03/30/19 1537    Visit Number  6    Number of Visits  13    Date for PT Re-Evaluation  05/08/19    Authorization Type  [employer] primary -September 2020-Feb 28th 2021, [facility] secondary (however may lose part B) - 30 visit limit combined PT/OT (Same day counts as 1 visit)    Authorization - Visit Number  9   6 PT, 3 from OT   Authorization - Number of Visits  30    PT Start Time  1441    PT Stop Time  1528    PT Time Calculation (min)  47 min    Equipment Utilized During Treatment  Gait belt    Activity Tolerance  Patient tolerated treatment well    Behavior During Therapy  [facility] for tasks assessed/performed   verbose, needs re-direction at times      Past Medical History:  Diagnosis Date  . Abnormal weight loss   . Anxiety   . Arthritis   . Cataract    OU  . Celiac disease   . Cervical neck pain with evidence of disc disease    patient has a cyst   . Chronic constipation   . Chronic diastolic heart failure ([place])    Pt. denies  . Chronic pain   . Degenerative disc disease at L5-S1 level    with stenosis  . DVT (deep venous thrombosis) (HCC)    Right upper arm, bilateral leg  . Eczema    inguinal, feet  . Elevated liver enzymes   . 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 Family history of adverse reaction to anesthesia    family has problems with anesthesia of nausea and vomiting   . 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 GERD (gastroesophageal reflux disease)    History of in 20's  . Gluten enteropathy   . H/O parotitis    right   . Hard of hearing   . History of kidney stones   . History of retinal tear     Bilateral  . History of staph infection    required wound vac  . Hx-TIA (transient ischemic attack)    2015  . LVH (left ventricular hypertrophy) 12/15/2016   Mild, noted on ECHO  . MVP (mitral valve prolapse)   . NAFL (nonalcoholic fatty liver)   . Neck pain   . Neuromuscular disorder ([place])    bilateral neuropathy feet.  . Pneumonia 12/17/2010  . Polycythemia   . Polycythemia, secondary   . PONV (postoperative nausea and vomiting)   . Protein C deficiency ([place])    Dr. Anne Fu  . Psoriasis    16 X10 cm psoriatic rash on sole of left foot ; open and occ scant bleeding;   . psoriatic arthritis   . PTSD (post-traumatic stress disorder)   . Scaphoid fracture of wrist 09/23/2013  . Seizure ([place])    childhood, medication until age 74 then weanned completely off  . Sleep apnea    split night study last done by Dr. [name] 06/18/15 shows severe OSA, CSA, and hypersomnia, rec bipap  . Splenomegaly   . Stenosis of ureteropelvic junction (UPJ)  left  . Stroke [facility])    CVA vs TIA in left cerebrum causing slight right sided weakness-Dr. [name] follows  . 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 Tachycardia    hx of   . Transfusion history    past history- none recent, after surgeries due to blood loss  . Wears glasses   . Wears hearing aid     Past Surgical History:  Procedure Laterality Date  . ABDOMINAL HYSTERECTOMY Bilateral 1994   TAH, BSO- tranverse incision at 54 yo  . ANKLE ARTHROSCOPY WITH RECONSTRUCTION Right 2007  . CHOLECYSTECTOMY     laparoscopic  . COLONOSCOPY     x3  . EYE SURGERY     Left eye 03/02/2018, right 02/15/2018  . HIP ARTHROSCOPY W/ LABRAL REPAIR Right 05/11/2013   acetabular labral tear 03/30/2013  . KNEE ARTHROPLASTY Right   . KNEE JOINT MANIPULATION Left    x3 under anesthesia  . KNEE SURGERY Bilateral 1984   Right ACL, left PCL repair  . LITHOTRIPSY  2005  . LIVER BIOPSY  2013   normal results.  [name] MASTECTOMY Bilateral     prior to 2009  . MOUTH SURGERY    . NASAL SEPTUM SURGERY N/A 09/20/2015   by ENT Dr. [name]  . OVARIAN CYST SURGERY Left    size of grapefruit, was informed that she had shortened vagina  . SHOULDER SURGERY Bilateral    Right 08/15/2016, Left 11/15/2016  . THUMB ARTHROSCOPY Left   . THYROIDECTOMY, PARTIAL Left 2008  . TOTAL KNEE ARTHROPLASTY Right 08/23/2018   Procedure: TOTAL KNEE ARTHROPLASTY;  Surgeon: [name], MD;  Location: [facility];  Service: Orthopedics;  Laterality: Right;  8mn  . TOTAL KNEE REVISION Left 02/06/2016   Procedure: LEFT TOTAL KNEE REVISION;  Surgeon: FGaynelle Arabian MD;  Location: [facility];  Service: Orthopedics;  Laterality: Left;  . TOTAL KNEE REVISION Left 04/22/2017   Procedure: Left knee polyethylene revision;  Surgeon: AGaynelle Arabian MD;  Location: [facility];  Service: Orthopedics;  Laterality: Left;  . UPPER GI ENDOSCOPY  2003    Vitals:   03/30/19 1443 03/30/19 1520  BP: (!) 118/97 114/83  Pulse: 98 93    Subjective Assessment - 03/30/19 1443    Subjective  Had an ultrasound of his spleen and liver this morning. Will be going for an ultrasound later today of his kidney. Aquatic therapy went well - was exhausted the next day. 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 PMH: Psoriatic arthritis, Polycythemia vera, CVA, s/p Rt TKR 08/23/18, Lt knee revision 04/2017, lumbar radiculitis, multiple scaphoid fx's Rt thumb, chronic pain    How long can you walk comfortably?  can walk around the house - furniture walking    Diagnostic tests  MRI cervical spine 07/2017: Unchanged C4 through C6 cervical spinal cord syrinx since the MRIin March 2016, and stable by report since October 2015., MRI brain 2019: No acute intracranial abnormality and stable noncontrast MRI appearance of the brain since 2017.    Patient Stated Goals  can't get up without using his hands, wants to  be able to get out of his chair without using his hands - less pain in legs and back, be stronger.    Currently in Pain?  Yes    Pain Score  7     Pain Location  Knee    Pain Orientation  Right    Pain Descriptors / Indicators  Sore    Pain Type  Chronic pain                       [facility] Adult PT Treatment/Exercise - 03/30/19 1459      Transfers   Sit to Stand  5: Supervision    Sit to Stand Details (indicate cue type and reason)  throughout session - using single UE from mat and single UE on RW      Ambulation/Gait   Ambulation/Gait  Yes    Ambulation/Gait Assistance  4: Min guard    Ambulation/Gait Assistance Details  chair follow for safety - not needed. cues for posture and to relax BLEs during gait. one lap during clinic plus 2 x 15' between activities in // bars.      Ambulation Distance (Feet)  115 Feet    Assistive device  Rolling walker    Gait Pattern  Step-through pattern;Trunk flexed;Decreased dorsiflexion - left;Decreased dorsiflexion - right;Decreased hip/knee flexion - right;Decreased hip/knee flexion - left;Decreased stance time - left;Right foot flat;Left foot flat    Ambulation Surface  Level;Indoor      Exercises   Exercises  Other Exercises    Other Exercises   standing in // bars: side stepping with BUE support down and back 2 reps       Knee/Hip Exercises: Stretches   Hip Flexor Stretch Limitations  prolonged hip flexor/quad stretch in prone position while performing LE strengthening exercises       Knee/Hip Exercises: Standing   Forward Step Up  1 set;Both;10 reps;Hand Hold: 2;Step Height: 4"    Forward Step Up Limitations  followed by 1 x 10 reps onto 6" step with BUE support, takes incr time to perform with LLE, seated rest break taken between sets      Knee/Hip Exercises: Prone   Hamstring Curl  1 set;10 reps   performed B   Hamstring Curl Limitations  cues for eccentric control    Hip Extension  Strengthening;Both;2 sets;5 reps    Hip  Extension Limitations  cues for technique, with knee bent and lifting foot up to ceiling, pt with decr ROM, cues to perform glute set first before lifting                PT Short Term Goals - 03/28/19 2120      PT SHORT TERM GOAL #1   Title  Pt will be independent with initial HEP for improved strength, balance, and gait. ALL STGS DUE 04/06/19    Time  4   due to delay in scheduling   Period  Weeks    Status  New    Target Date  04/06/19      PT SHORT TERM GOAL #2   Title  Patient will undergo further assessment of TUG with use of RW vs. BERG  in order to determine fall risk - goal written as appropriate.    Baseline  not yet assessed.    Time  4    Period  Weeks    Status  New      PT SHORT TERM GOAL #3   Title  Patient will verbalize understanding of fall prevention strategies in the home.    Time  4    Period  Weeks    Status  New      PT SHORT TERM GOAL #4   Title  Patient will ambulate at least 71' with RW with supervision in order to safely ambulate at home.    Baseline  not yet assessed.    Time  4    Period  Weeks    Status  New      PT SHORT TERM GOAL #5   Title  Patient will perform 4 steps with supervision using single railing and step to pattern in order to increase safety at home with stairs.    Baseline  not yet assessed.    Time  4    Period  Weeks    Status  New      PT SHORT TERM GOAL #6   Title  Perform 5x sit to stand from standard arm chair vs. mat table and write goal as appropriate.    Time  4    Period  Weeks    Status  New        PT Long Term Goals - 03/28/19 2120      PT LONG TERM GOAL #1   Title  Pt will be independent with final HEP for land/aquatic therapy for improved strength, ROM, balance, and gait. ALL LTGS DUE 05/04/19    Time  8   due to delay in scheduling   Period  Weeks    Status  New      PT LONG TERM GOAL #2   Title  BERG vs. TUG score to be written as appropriate to determine fall risk.    Time  8    Period   Weeks    Status  New      PT LONG TERM GOAL #3   Title  Patient will ambulate at least 100' with RW with mod I in order to safely ambulate at home.    Baseline  not yet assessed.    Time  8    Period  Weeks    Status  New      PT LONG TERM GOAL #4   Title  Patient will perform 12 steps with supervision using single railing and step to pattern in order to increase safety at home with stairs.    Baseline  not yet assessed.    Time  8    Period  Weeks    Status  New      PT LONG TERM GOAL #5   Title  Patient will perform bed mobility, stap step transfers with RW with mod I in order to decrease caregiver burden.    Baseline  not yet assessed.    Time  8    Period  Weeks    Status  New      PT LONG TERM GOAL #6   Title  5x sit <> stand goal to be written as appropriate.    Time  8    Period  Weeks    Status  New            Plan - 03/30/19 1546    Clinical Impression Statement  Focus of today's skilled session was gait with RW and LE strengthening. Pt tolerated session well - needed intermittent breaks due to fatigue. Pt's diastolic BP elevated at beginning of session at 118/97. Was reassessed later in session after activity and was WNL (114/83).Pt with increased difficulty performing step ups with LLE.    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 PMH: Psoriatic arthritis, Polycythemia vera,  CVA, s/p Rt TKR 08/23/18, Lt knee revision 04/2017, lumbar radiculitis, multiple scaphoid fx's Rt thumb, chronic pain    Examination-Activity Limitations  Transfers;Stairs;Locomotion Level;Stand    Examination-Participation Restrictions  Community Activity    Stability/Clinical Decision Making  Evolving/Moderate complexity    Rehab Potential  Good    PT Frequency  2x / week    PT Duration  6 weeks    PT Treatment/Interventions  ADLs/Self Care Home Management;Neuromuscular  re-education;Therapeutic activities;Therapeutic exercise;Patient/family education;Manual techniques;Scar mobilization;Passive range of motion;Manual lymph drainage;Electrical Stimulation;Aquatic Therapy;Energy conservation;Gait training;Stair training;Functional mobility training;DME Instruction;Balance training;Orthotic Fit/Training    PT Next Visit Plan  continue LE strengthening, gait training (walking around obstacles), assess stairs. perform 5x sit <> stand and TUG, standing balance in // bars, fall prevention strategy handout.    PT Home Exercise Plan  [id number]    Consulted and Agree with Plan of Care  Patient       Patient will benefit from skilled therapeutic intervention in order to improve the following deficits and impairments:  Decreased range of motion, Decreased endurance, Decreased activity tolerance, Abnormal gait, Difficulty walking, Decreased coordination, Decreased balance, Decreased strength, Pain, Decreased mobility, Decreased safety awareness  Visit Diagnosis: Other abnormalities of gait and mobility  Muscle weakness (generalized)  Other lack of coordination  Other symptoms and signs involving the musculoskeletal system     Problem List Patient Active Problem List   Diagnosis Date Noted  . Osteoarthritis of right knee 08/23/2018  . Constipation due to opioid therapy 03/30/2018  . Retinopathy of both eyes 01/06/2018  . SNHL (sensorineural hearing loss) 12/04/2017  . Abnormal urinary stream 12/03/2017  . Osteoarthritis of carpometacarpal (CMC) joint of thumb 11/30/2017  . Muscle weakness 11/17/2017  . Transient vision disturbance 11/12/2017  . Bilateral hand pain 10/30/2017  . Pain of left hip joint 10/09/2017  . Gynecomastia 07/10/2017  . Iron deficiency anemia 07/05/2017  . Spasticity 05/20/2017  . Lumbar radiculitis 04/20/2017  . Ulnar 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, left 11/28/2016  . Idiopathic peripheral neuropathy 11/28/2016  . Failed total knee arthroplasty,  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  . Long term (current) use of anticoagulants 08/23/2015  . Right upper quadrant abdominal pain 08/23/2015  . Memory loss 05/10/2015  . Gait abnormality 04/07/2015  . Alkaline phosphatase elevation 04/07/2015  . History of thrombosis 03/26/2015  . Medial epicondylitis 02/07/2015  . Cognitive decline 12/21/2014  . Leukopenia 12/05/2014  . Rotator cuff syndrome of right shoulder 10/27/2014  . Status post left knee replacement 08/22/2014  . Left lateral epicondylitis 08/22/2014  . Chronic cerebral ischemia 08/18/2014  . Arthrofibrosis of knee joint 08/17/2014  . Cubital canal compression syndrome, left 08/17/2014  . 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 Chronic pain syndrome 04/10/2014  . Insomnia 04/10/2014  . Chronic non-specific white matter lesions on MRI 04/10/2014  . CFS (chronic fatigue syndrome) 04/10/2014  . 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 Biceps tendonitis on left 03/01/2014  . Polycythemia vera ([place]) 12/27/2013  . H/O TIA (transient ischemic attack) and stroke 12/27/2013  . Neck pain 12/27/2013  . OSA (obstructive sleep apnea) 12/08/2013  . 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 Depression with anxiety 08/04/2013  . Protein C deficiency ([place]) 08/01/2013  . Post traumatic stress disorder (PTSD) 08/01/2013  . Speech abnormality 07/25/2013  . Obesity 05/10/2013  . Lower extremity edema 05/10/2013  . GERD (gastroesophageal reflux disease) 05/10/2013  . Arthritis 05/10/2013  . OA (osteoarthritis) of knee 03/15/2013  . Headache 10/25/2012  . Palpitations 10/18/2012  . Fatty liver  determined by biopsy 06/01/2012  . Arthropathic psoriasis, unspecified ([place]) 04/29/2012  . Abnormal liver enzymes 03/29/2012  . Psoriatic arthritis ([place]) 12/29/2011  . Left Renal Hydronephrosis 12/11/2010  . 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 Celiac disease 05/27/2010  . Thyroid nodule 05/27/2010  . Male-to-male transgender person  09/20/2002    Arliss Journey, PT, DPT  03/30/2019, 3:50 PM  [facility] [address] [place], [place], [address] Phone: [phone]   Fax:  [phone]  Name: Anees Vanecek MRN: [id number] Date of Birth: Jun 30, 1965

## 2019-03-31 ENCOUNTER — Encounter: Payer: Self-pay | Admitting: Hematology

## 2019-03-31 ENCOUNTER — Ambulatory Visit: Payer: BC Managed Care – PPO | Admitting: Psychology

## 2019-03-31 ENCOUNTER — Ambulatory Visit (INDEPENDENT_AMBULATORY_CARE_PROVIDER_SITE_OTHER): Payer: BC Managed Care – PPO | Admitting: Urology

## 2019-03-31 ENCOUNTER — Encounter: Payer: Self-pay | Admitting: Urology

## 2019-03-31 VITALS — BP 120/83 | HR 99 | Ht 69.0 in | Wt 203.0 lb

## 2019-03-31 DIAGNOSIS — N3281 Overactive bladder: Secondary | ICD-10-CM

## 2019-03-31 DIAGNOSIS — Q6211 Congenital occlusion of ureteropelvic junction: Secondary | ICD-10-CM

## 2019-03-31 DIAGNOSIS — M6289 Other specified disorders of muscle: Secondary | ICD-10-CM

## 2019-03-31 DIAGNOSIS — R351 Nocturia: Secondary | ICD-10-CM | POA: Diagnosis not present

## 2019-03-31 NOTE — Progress Notes (Signed)
03/31/2019 3:04 PM   Jesse Bowers Nov 18, 1965 [id number]  Referring provider: Shawnee Knapp, MD [address] [place],  [place] [address]  Chief Complaint  Patient presents with  . Follow-up    HPI: Jesse Bowers is a 54 year old male with history of stroke, CHF, congenital left UPJ obstruction, OAB, pelvic floor dysfunction and a history of gross hematuria who presents today with right flank pain.  RUS 03/30/2019 noted moderate left pelvocaliectasis, consistent with history of chronic left UPJ obstruction. Overall, appearance is relatively similar as compared to 2019.  No nephrolithiasis.   Mild splenomegaly, also grossly stable from 2019.  He is still in process with his referral to the [facility] multidisciplinary clinic with Jesse Bowers.    He states that with the Myrbetriq, he is noticing control of his urinary symptoms during the day.  The urinary issues occur mainly in the evening and through the night.    Patient denies any modifying or aggravating factors.  Patient denies any gross hematuria, dysuria or suprapubic/flank pain.  Patient denies any fevers, chills, nausea or vomiting.   PMH: Past Medical History:  Diagnosis Date  . Abnormal weight loss   . Anxiety   . Arthritis   . Cataract    OU  . Celiac disease   . Cervical neck pain with evidence of disc disease    patient has a cyst   . Chronic constipation   . Chronic diastolic heart failure ([place])    Pt. denies  . Chronic pain   . Degenerative disc disease at L5-S1 level    with stenosis  . DVT (deep venous thrombosis) (HCC)    Right upper arm, bilateral leg  . Eczema    inguinal, feet  . Elevated liver enzymes   . 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 Family history of adverse reaction to anesthesia    family has problems with anesthesia of nausea and vomiting   . 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 GERD (gastroesophageal reflux disease)    History of in 20's  . Gluten enteropathy   . H/O parotitis     right   . Hard of hearing   . History of kidney stones   . History of retinal tear    Bilateral  . History of staph infection    required wound vac  . Hx-TIA (transient ischemic attack)    2015  . LVH (left ventricular hypertrophy) 12/15/2016   Mild, noted on ECHO  . MVP (mitral valve prolapse)   . NAFL (nonalcoholic fatty liver)   . Neck pain   . Neuromuscular disorder ([place])    bilateral neuropathy feet.  . Pneumonia 12/17/2010  . Polycythemia   . Polycythemia, secondary   . PONV (postoperative nausea and vomiting)   . Protein C deficiency ([place])    Jesse Bowers  . Psoriasis    16 X10 cm psoriatic rash on sole of left foot ; open and occ scant bleeding;   . psoriatic arthritis   . PTSD (post-traumatic stress disorder)   . Scaphoid fracture of wrist 09/23/2013  . Seizure ([place])    childhood, medication until age 89 then weanned completely off  . Sleep apnea    split night study last done by Jesse Bowers 06/18/15 shows severe OSA, CSA, and hypersomnia, rec bipap  . Splenomegaly   . Stenosis of ureteropelvic junction (UPJ)    left  . Stroke [facility])    CVA vs TIA in left  cerebrum causing slight right sided weakness-Jesse Bowers follows  . Syrinx of spinal cord ([place]) 01/06/2014   c spine on MRI  . Tachycardia    hx of   . Transfusion history    past history- none recent, after surgeries due to blood loss  . Wears glasses   . Wears hearing aid     Surgical History: Past Surgical History:  Procedure Laterality Date  . ABDOMINAL HYSTERECTOMY Bilateral 1994   TAH, BSO- tranverse incision at 54 yo  . ANKLE ARTHROSCOPY WITH RECONSTRUCTION Right 2007  . CHOLECYSTECTOMY     laparoscopic  . COLONOSCOPY     x3  . EYE SURGERY     Left eye 03/02/2018, right 02/15/2018  . HIP ARTHROSCOPY W/ LABRAL REPAIR Right 05/11/2013   acetabular labral tear 03/30/2013  . KNEE ARTHROPLASTY Right   . KNEE JOINT MANIPULATION Left    x3 under anesthesia  . KNEE SURGERY Bilateral 1984    Right ACL, left PCL repair  . LITHOTRIPSY  2005  . LIVER BIOPSY  2013   normal results.  [name] MASTECTOMY Bilateral    prior to 2009  . MOUTH SURGERY    . NASAL SEPTUM SURGERY N/A 09/20/2015   by ENT Jesse Bowers  . OVARIAN CYST SURGERY Left    size of grapefruit, was informed that she had shortened vagina  . SHOULDER SURGERY Bilateral    Right 08/15/2016, Left 11/15/2016  . THUMB ARTHROSCOPY Left   . THYROIDECTOMY, PARTIAL Left 2008  . TOTAL KNEE ARTHROPLASTY Right 08/23/2018   Procedure: TOTAL KNEE ARTHROPLASTY;  Surgeon: Jesse Arabian, MD;  Location: [facility];  Service: Orthopedics;  Laterality: Right;  68mn  . TOTAL KNEE REVISION Left 02/06/2016   Procedure: LEFT TOTAL KNEE REVISION;  Surgeon: Jesse Arabian MD;  Location: [facility];  Service: Orthopedics;  Laterality: Left;  . TOTAL KNEE REVISION Left 04/22/2017   Procedure: Left knee polyethylene revision;  Surgeon: Jesse Arabian MD;  Location: [facility];  Service: Orthopedics;  Laterality: Left;  . UPPER GI ENDOSCOPY  2003    Home Medications:  Allergies as of 03/31/2019      Reactions   Gabapentin Nausea Only, Other (See Comments)   Other reaction(s): nausea, mental status Drowsiness and restlessness. Pt. States, " It makes me crazy, I can't take this medicine."   Penicillin G Anaphylaxis, Other (See Comments)   Has patient had a PCN reaction causing immediate rash, facial/tongue/throat swelling, SOB or lightheadedness with hypotension: Yes Has patient had a PCN reaction causing severe rash involving mucus membranes or skin necrosis: No Has patient had a PCN reaction that required hospitalization Yes Has patient had a PCN reaction occurring within the last 10 years: No If all of the above answers are "NO", then may proceed with Cephalosporin use.   Sulfa Antibiotics Rash   Stevens-Johnson rash   Tegaderm Chg Dressing  [chlorhexidine] Swelling, Itching   Vancomycin Rash, Other (See Comments)   RED MAN SYNDROME CAN HAVE IF GIVEN OVER  2HOURS   Duloxetine Other (See Comments)   Restless legs   Tegaderm Ag Mesh [silver] Dermatitis   Causes blistering wounds   Citalopram Other (See Comments)   Dystonia   Wheat Bran    Due to Celiac   Ibuprofen Other (See Comments)   Contraindicated with Xarelto.    Nortriptyline Other (See Comments)   Dry mouth at 25 mg dose.  Tolerates 10 mg dose   Pregabalin Other (See Comments)   Ineffective   Sulfacetamide Sodium-sulfur  Rash      Medication List       Accurate as of March 31, 2019  3:04 PM. If you have any questions, ask your nurse or doctor.        acyclovir ointment 5 % Commonly known as: ZOVIRAX Apply 1 application topically daily as needed (cold sores).   antiseptic oral rinse Liqd 15 mLs by Mouth Rinse route as needed for dry mouth.   augmented betamethasone dipropionate 0.05 % ointment Commonly known as: DIPROLENE-AF Apply 1 application topically 2 (two) times daily.   baclofen 20 MG tablet Commonly known as: LIORESAL Take 1 tablet (20 mg total) by mouth 4 (four) times daily.   CeraVe Crea Apply 1 application topically 2 (two) times a day.   clobetasol 0.05 % Gel Commonly known as: TEMOVATE Apply 1 application topically 2 (two) times daily as needed (itching).   clorazepate 7.5 MG tablet Commonly known as: TRANXENE Take 7.5 mg by mouth 2 (two) times daily.   clorazepate 15 MG tablet Commonly known as: TRANXENE Take 15 mg by mouth at bedtime.   Cosentyx Sensoready Pen 150 MG/ML [place] Generic drug: Secukinumab Inject 300 mg into the skin every 28 (twenty-eight) days.   desonide 0.05 % ointment Commonly known as: DESOWEN Apply 1 application topically 2 (two) times daily as needed (psoriasis).   diclofenac sodium 1 % Gel Commonly known as: VOLTAREN APPLY TOPICALLY TO BOTH HANDS 3 TIMES DAILY.   Enstilar 0.005-0.064 % Foam Generic drug: Calcipotriene-Betameth Diprop Apply 1 application topically 2 (two) times daily.   Fluocinolone Acetonide  0.01 % Oil Place 3 drops into both ears 2 (two) times daily as needed for itching.   furosemide 40 MG tablet Commonly known as: LASIX Take 1 tablet (40 mg total) by mouth daily. To be taken with the 20 mg to equal 60 mg daily.   furosemide 20 MG tablet Commonly known as: LASIX Take 1 tablet (20 mg total) by mouth daily. To be taken with the 40 mg to equal 60 mg daily   mirabegron ER 50 MG Tb24 tablet Commonly known as: MYRBETRIQ Take 1 tablet (50 mg total) by mouth daily.   morphine 60 MG 24 hr capsule Commonly known as: Kadian Take 1 capsule (60 mg total) by mouth every 12 (twelve) hours.   NEEDLE (DISP) 18 G 18G X 1-1/2" Misc 1 Units by Does not apply route once a week.   NEEDLE (DISP) 23 G 23G X 3/4" Misc 1 Units by Does not apply route once a week.   neomycin-polymyxin-hydrocortisone 3.5-10000-1 OTIC suspension Commonly known as: CORTISPORIN Place 4 drops into both ears 2 (two) times daily as needed (ear pain).   Oxycodone HCl 10 MG Tabs Take 1 tablet (10 mg total) by mouth every 8 (eight) hours as needed.   potassium chloride 10 MEQ tablet Commonly known as: KLOR-CON TAKE K-DUR 2 TABLETS 10 MEQ DAILY   prazosin 2 MG capsule Commonly known as: MINIPRESS Take 4 mg by mouth at bedtime.   rivaroxaban 20 MG Tabs tablet Commonly known as: Xarelto Take 1 tablet (20 mg total) by mouth at bedtime. TAKE 1 TABLET DAILY BEFORE BEDTIME   Syringe (Disposable) 1 ML Misc 1 Units by Does not apply route once a week.   Systane Complete 0.6 % Soln Generic drug: Propylene Glycol Place 1-2 drops into both eyes 4 (four) times daily as needed (dry eyes).   testosterone cypionate 200 MG/ML injection Commonly known as: DEPOTESTOSTERONE CYPIONATE INJECT 0.5 MLS (100 MG  TOTAL) INTO THE MUSCLE ONCE A WEEK.   ursodiol 500 MG tablet Commonly known as: ACTIGALL Take 500 mg by mouth 2 (two) times daily with a meal.   ursodiol 250 MG tablet Commonly known as: ACTIGALL Take 250 mg by  mouth 2 (two) times daily with a meal.   venlafaxine XR 150 MG 24 hr capsule Commonly known as: EFFEXOR-XR Take 2 capsules (300 mg total) by mouth daily with breakfast.       Allergies:  Allergies  Allergen Reactions  . Gabapentin Nausea Only and Other (See Comments)    Other reaction(s): nausea, mental status Drowsiness and restlessness. Pt. States, " It makes me crazy, I can't take this medicine."  . Penicillin G Anaphylaxis and Other (See Comments)    Has patient had a PCN reaction causing immediate rash, facial/tongue/throat swelling, SOB or lightheadedness with hypotension: Yes Has patient had a PCN reaction causing severe rash involving mucus membranes or skin necrosis: No Has patient had a PCN reaction that required hospitalization Yes Has patient had a PCN reaction occurring within the last 10 years: No If all of the above answers are "NO", then may proceed with Cephalosporin use.   . Sulfa Antibiotics Rash    Stevens-Johnson rash  . Tegaderm Chg Dressing  [Chlorhexidine] Swelling and Itching  . Vancomycin Rash and Other (See Comments)    RED MAN SYNDROME CAN HAVE IF GIVEN OVER 2HOURS  . Duloxetine Other (See Comments)    Restless legs  . Tegaderm Ag Mesh [Silver] Dermatitis    Causes blistering wounds   . Citalopram Other (See Comments)    Dystonia  . Wheat Bran     Due to Celiac  . Ibuprofen Other (See Comments)    Contraindicated with Xarelto.   . Nortriptyline Other (See Comments)    Dry mouth at 25 mg dose.  Tolerates 10 mg dose  . Pregabalin Other (See Comments)    Ineffective  . Sulfacetamide Sodium-Sulfur Rash    Family History: Family History  Problem Relation Age of Onset  . Stroke Maternal Grandfather        49  . Heart attack Maternal Grandfather   . Glaucoma Maternal Grandfather   . Macular degeneration Maternal Grandfather   . Hypertension Mother   . Psoriasis Mother   . Other Mother        meningioma developed ~2019  . Glaucoma Mother    . Cancer Paternal Grandfather   . Heart attack Paternal Grandfather   . Stroke Paternal Uncle        age 71  . Polycythemia Paternal Uncle   . Stroke Maternal Grandmother   . Congestive Heart Failure Maternal Grandmother   . Heart attack Maternal Grandmother   . Protein C deficiency Sister 71       Miscarriages    Social History:  reports that he has never smoked. He has never used smokeless tobacco. He reports current alcohol use. He reports that he does not use drugs.  ROS: UROLOGY Frequent Urination?: No Hard to postpone urination?: No Burning/pain with urination?: No Get up at night to urinate?: Yes Leakage of urine?: No Urine stream starts and stops?: No Trouble starting stream?: No Do you have to strain to urinate?: No Blood in urine?: No Urinary tract infection?: No Sexually transmitted disease?: No Injury to kidneys or bladder?: No Painful intercourse?: No Weak stream?: No Erection problems?: No Penile pain?: No Currently pregnant?: No Vaginal bleeding?: No Last menstrual period?: n  Gastrointestinal Nausea?: Yes  Vomiting?: No Indigestion/heartburn?: No Diarrhea?: No Constipation?: No  Constitutional Fever: No Night sweats?: No Weight loss?: No Fatigue?: Yes  Skin Skin rash/lesions?: No Itching?: No  Eyes Blurred vision?: No Double vision?: No  Ears/Nose/Throat Sore throat?: No Sinus problems?: No  Hematologic/Lymphatic Swollen glands?: Yes Easy bruising?: Yes  Cardiovascular Leg swelling?: Yes Chest pain?: No  Respiratory Cough?: No Shortness of breath?: Yes  Endocrine Excessive thirst?: No  Musculoskeletal Back pain?: No Joint pain?: Yes  Neurological Headaches?: No Dizziness?: No  Psychologic Depression?: No Anxiety?: Yes  Physical Exam: BP 120/83   Pulse 99   Ht 5' 9"  (1.753 m)   Wt 203 lb (92.1 kg)   BMI 29.98 kg/m   Constitutional:  Well nourished. Alert and oriented, No acute distress. HEENT: [place] AT, mask  in place.  Trachea midline, no masses. Cardiovascular: No clubbing, cyanosis, or edema. Respiratory: Normal respiratory effort, no increased work of breathing. Neurologic: Grossly intact, no focal deficits, moving all 4 extremities.  In chair.   Psychiatric: Normal mood and affect.  Laboratory Data: Lab Results  Component Value Date   WBC 4.7 03/11/2019   HGB 13.6 03/11/2019   HCT 44.8 03/11/2019   MCV 78.7 (L) 03/11/2019   PLT 232 03/11/2019    Lab Results  Component Value Date   CREATININE 1.09 02/23/2019    No results found for: PSA  Lab Results  Component Value Date   TESTOSTERONE 611 06/10/2018    No results found for: HGBA1C  Lab Results  Component Value Date   TSH 1.370 04/29/2018       Component Value Date/Time   CHOL 142 01/05/2018 1509   HDL 52 01/05/2018 1509   CHOLHDL 2.7 01/05/2018 1509   LDLCALC 81 01/05/2018 1509    Lab Results  Component Value Date   AST 23 02/23/2019   Lab Results  Component Value Date   ALT 22 02/23/2019   No components found for: ALKALINEPHOPHATASE No components found for: BILIRUBINTOTAL  No results found for: ESTRADIOL  Urinalysis    Component Value Date/Time   COLORURINE YELLOW 02/01/2016 0800   APPEARANCEUR Clear 12/08/2018 0851   LABSPEC 1.011 02/01/2016 0800   LABSPEC 1.005 08/22/2015 1608   PHURINE 7.0 02/01/2016 0800   GLUCOSEU Negative 12/08/2018 0851   GLUCOSEU Negative 08/22/2015 1608   HGBUR NEGATIVE 02/01/2016 0800   BILIRUBINUR Negative 12/08/2018 0851   BILIRUBINUR Negative 08/22/2015 1608   KETONESUR NEGATIVE 02/01/2016 0800   PROTEINUR Negative 12/08/2018 0851   PROTEINUR NEGATIVE 02/01/2016 0800   UROBILINOGEN 0.2 08/22/2015 1608   NITRITE Negative 12/08/2018 0851   NITRITE NEGATIVE 02/01/2016 0800   LEUKOCYTESUR Trace (A) 12/08/2018 0851   LEUKOCYTESUR Trace 08/22/2015 1608    I have reviewed the labs.   Pertinent Imaging: CLINICAL DATA:  Initial evaluation for hydronephrosis.  History of left UPJ obstruction.  EXAM: RENAL / URINARY TRACT ULTRASOUND COMPLETE  COMPARISON:  Prior CT from 01/13/2018.  FINDINGS: Right Kidney:  Renal measurements: 10.2 x 4.8 x 4.8 cm = volume: 122 mL . Echogenicity within normal limits. No mass or hydronephrosis visualized. No visible nephrolithiasis.  Left Kidney:  Renal measurements: 11.4 x 5.5 x 6.0 cm = volume: 195 mL. Echogenicity within normal limits. No visible nephrolithiasis. Persistent moderate pelvocaliectasis, consistent with history of known left UPJ obstruction. This persists status post voiding. Overall, appearance is grossly similar to prior exams. No focal renal mass.  Bladder:  Appears normal for degree of bladder distention. Bilateral ureteral jets are visualized at  the bladder.  Other:  Splenomegaly, also grossly similar to prior CT.  IMPRESSION: 1. Moderate left pelvocaliectasis, consistent with history of chronic left UPJ obstruction. Overall, appearance is relatively similar as compared to 2019. 2. No nephrolithiasis. 3. Mild splenomegaly, also grossly stable from 2019.   Electronically Signed   By: Jeannine Boga M.D.   On: 03/30/2019 19:55  I have independently reviewed the films and will pursue renal lasix scan.    Assessment & Plan:    1. UPJ obstruction, congenital Will obtain a renal lasix scan to evaluate renal function at this time  2. OAB/pelvic floor dysfunction Still possibly transitioning care to [facility]  3. Nocturia ? Secondary to CHF - encouraged to contact cardiologist  Still possibly transitioning care to [facility]   Return for Return for renal lasix report - can be virtual .  These notes generated with voice recognition software. I apologize for typographical errors.  Zara Council, PA-C  [facility] [address]  [place],  [address] [phone]

## 2019-04-02 ENCOUNTER — Other Ambulatory Visit: Payer: Self-pay | Admitting: Family Medicine

## 2019-04-02 DIAGNOSIS — R222 Localized swelling, mass and lump, trunk: Secondary | ICD-10-CM

## 2019-04-04 ENCOUNTER — Ambulatory Visit: Payer: BC Managed Care – PPO | Attending: Physical Medicine & Rehabilitation | Admitting: Physical Therapy

## 2019-04-04 ENCOUNTER — Other Ambulatory Visit: Payer: Self-pay

## 2019-04-04 DIAGNOSIS — M79641 Pain in right hand: Secondary | ICD-10-CM | POA: Diagnosis present

## 2019-04-04 DIAGNOSIS — R29818 Other symptoms and signs involving the nervous system: Secondary | ICD-10-CM | POA: Diagnosis present

## 2019-04-04 DIAGNOSIS — R278 Other lack of coordination: Secondary | ICD-10-CM | POA: Diagnosis present

## 2019-04-04 DIAGNOSIS — M6281 Muscle weakness (generalized): Secondary | ICD-10-CM

## 2019-04-04 DIAGNOSIS — R29898 Other symptoms and signs involving the musculoskeletal system: Secondary | ICD-10-CM | POA: Insufficient documentation

## 2019-04-04 DIAGNOSIS — R2689 Other abnormalities of gait and mobility: Secondary | ICD-10-CM

## 2019-04-04 DIAGNOSIS — R2681 Unsteadiness on feet: Secondary | ICD-10-CM

## 2019-04-05 ENCOUNTER — Ambulatory Visit (INDEPENDENT_AMBULATORY_CARE_PROVIDER_SITE_OTHER): Payer: BC Managed Care – PPO | Admitting: Psychology

## 2019-04-05 ENCOUNTER — Encounter: Payer: BC Managed Care – PPO | Admitting: Occupational Therapy

## 2019-04-05 ENCOUNTER — Ambulatory Visit: Payer: BC Managed Care – PPO | Admitting: Physical Therapy

## 2019-04-05 DIAGNOSIS — F4323 Adjustment disorder with mixed anxiety and depressed mood: Secondary | ICD-10-CM

## 2019-04-05 NOTE — Therapy (Signed)
[facility] [address] [place], [place], [address] Phone: [phone]   Fax:  [phone]  Physical Therapy Treatment  Patient Details  Name: Jesse Bowers MRN: [id number] Date of Birth: 07-19-1965 Referring Provider (PT): Clerance Lav, MD   Encounter Date: 04/04/2019  PT End of Session - 04/05/19 2225    Visit Number  7    Number of Visits  13    Date for PT Re-Evaluation  05/08/19    Authorization Type  [employer] primary -September 2020-Feb 28th 2021, [facility] secondary (however may lose part B) - 30 visit limit combined PT/OT (Same day counts as 1 visit)    Authorization - Visit Number  10   6 PT, 3 from OT   Authorization - Number of Visits  30    PT Start Time  1555    PT Stop Time  1640    PT Time Calculation (min)  45 min    Equipment Utilized During Treatment  Other (comment)   ankle cuffs, bar bells   Activity Tolerance  Patient tolerated treatment well    Behavior During Therapy  [facility] for tasks assessed/performed   verbose, needs re-direction at times      Past Medical History:  Diagnosis Date  . Abnormal weight loss   . Anxiety   . Arthritis   . Cataract    OU  . Celiac disease   . Cervical neck pain with evidence of disc disease    patient has a cyst   . Chronic constipation   . Chronic diastolic heart failure ([place])    Pt. denies  . Chronic pain   . Degenerative disc disease at L5-S1 level    with stenosis  . DVT (deep venous thrombosis) (HCC)    Right upper arm, bilateral leg  . Eczema    inguinal, feet  . Elevated liver enzymes   . 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 Family history of adverse reaction to anesthesia    family has problems with anesthesia of nausea and vomiting   . 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 GERD (gastroesophageal reflux disease)    History of in 20's  . Gluten enteropathy   . H/O parotitis    right   . Hard of hearing   . History of kidney stones    . History of retinal tear    Bilateral  . History of staph infection    required wound vac  . Hx-TIA (transient ischemic attack)    2015  . LVH (left ventricular hypertrophy) 12/15/2016   Mild, noted on ECHO  . MVP (mitral valve prolapse)   . NAFL (nonalcoholic fatty liver)   . Neck pain   . Neuromuscular disorder ([place])    bilateral neuropathy feet.  . Pneumonia 12/17/2010  . Polycythemia   . Polycythemia, secondary   . PONV (postoperative nausea and vomiting)   . Protein C deficiency ([place])    Dr. Anne Fu  . Psoriasis    16 X10 cm psoriatic rash on sole of left foot ; open and occ scant bleeding;   . psoriatic arthritis   . PTSD (post-traumatic stress disorder)   . Scaphoid fracture of wrist 09/23/2013  . Seizure ([place])    childhood, medication until age 4 then weanned completely off  . Sleep apnea    split night study last done by Dr. [name] 06/18/15 shows severe OSA, CSA, and hypersomnia, rec bipap  . Splenomegaly   . Stenosis of  ureteropelvic junction (UPJ)    left  . Stroke [facility])    CVA vs TIA in left cerebrum causing slight right sided weakness-Dr. [name] follows  . 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 Tachycardia    hx of   . Transfusion history    past history- none recent, after surgeries due to blood loss  . Wears glasses   . Wears hearing aid     Past Surgical History:  Procedure Laterality Date  . ABDOMINAL HYSTERECTOMY Bilateral 1994   TAH, BSO- tranverse incision at 54 yo  . ANKLE ARTHROSCOPY WITH RECONSTRUCTION Right 2007  . CHOLECYSTECTOMY     laparoscopic  . COLONOSCOPY     x3  . EYE SURGERY     Left eye 03/02/2018, right 02/15/2018  . HIP ARTHROSCOPY W/ LABRAL REPAIR Right 05/11/2013   acetabular labral tear 03/30/2013  . KNEE ARTHROPLASTY Right   . KNEE JOINT MANIPULATION Left    x3 under anesthesia  . KNEE SURGERY Bilateral 1984   Right ACL, left PCL repair  . LITHOTRIPSY  2005  . LIVER BIOPSY  2013   normal  results.  [name] MASTECTOMY Bilateral    prior to 2009  . MOUTH SURGERY    . NASAL SEPTUM SURGERY N/A 09/20/2015   by ENT Dr. [name]  . OVARIAN CYST SURGERY Left    size of grapefruit, was informed that she had shortened vagina  . SHOULDER SURGERY Bilateral    Right 08/15/2016, Left 11/15/2016  . THUMB ARTHROSCOPY Left   . THYROIDECTOMY, PARTIAL Left 2008  . TOTAL KNEE ARTHROPLASTY Right 08/23/2018   Procedure: TOTAL KNEE ARTHROPLASTY;  Surgeon: [name], MD;  Location: [facility];  Service: Orthopedics;  Laterality: Right;  25mn  . TOTAL KNEE REVISION Left 02/06/2016   Procedure: LEFT TOTAL KNEE REVISION;  Surgeon: FGaynelle Arabian MD;  Location: [facility];  Service: Orthopedics;  Laterality: Left;  . TOTAL KNEE REVISION Left 04/22/2017   Procedure: Left knee polyethylene revision;  Surgeon: AGaynelle Arabian MD;  Location: [facility];  Service: Orthopedics;  Laterality: Left;  . UPPER GI ENDOSCOPY  2003    There were no vitals filed for this visit.  Subjective Assessment - 04/05/19 2221    Subjective  Pt arrived 10" for aquatic therapy appt - amb. 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 PMH: Psoriatic arthritis, Polycythemia vera, CVA, s/p Rt TKR 08/23/18, Lt knee revision 04/2017, lumbar radiculitis, multiple scaphoid fx's Rt thumb, chronic pain    How long can you walk comfortably?  can walk around the house - furniture walking    Diagnostic tests  MRI cervical spine 07/2017: Unchanged C4 through C6 cervical spinal cord syrinx since the MRIin March 2016, and stable by report since October 2015., MRI brain 2019: No acute intracranial abnormality and stable noncontrast MRI appearance of the brain since 2017.    Patient Stated Goals  can't get up without using his hands, wants to be able to get out of his chair  without using his hands - less pain in legs and back, be stronger.    Currently in Pain?  Yes    Pain Score  6     Pain Location  Knee    Pain Orientation  Right  Pain Descriptors / Indicators  Sore    Pain Type  Chronic pain    Pain Onset  More than a month ago    Pain Frequency  Constant              Aquatic therapy at [facility] - pool temp. 87.6 degrees  Patient seen for aquatic therapy today.  Treatment took place in water 2.5-4 feet deep depending upon activity.  Pt entered and exited  the pool via ramp negotiation with use Hand rails   Pt performed runner's stretch for hamstring and heel cord stretching - 30 sec hold 2 reps on RLE & LLE  Pt gait trained in pool - 51mx 4 reps; bar bells used on 4th rep for increased resistance for strengthening and also to fascilitate reciprocal arm swing in gait   Pt performed marching in place 10 reps each leg - cues to perform slowly to improve SLS Buoyant ankle cuff used on each leg - 15 reps for hip strengthening - flexion, extension, abduction, hip and knee flexion/extension with knee flexed   Pt performed Ai Chi postures - soothing 10 reps each; gathering posture 10 reps for core stabilization Pt performed back stretches in pool -holding onto edge to assist with rotation and flexion for stretching Rt & Lt sides   Pt requires the bouyancy of water for support and for reduced fall risk with balance and unsupported standing activities; buoyancy of water also needed for  spinal decompression for reduced pain with weight bearing activities.  Viscosity of water needed for resistance for strengthening exercises.                               PT Short Term Goals - 04/05/19 2229      PT SHORT TERM GOAL #1   Title  Pt will be independent with initial HEP for improved strength, balance, and gait. ALL STGS DUE 04/06/19    Time  4   due to delay in scheduling   Period  Weeks    Status  New    Target Date  04/06/19       PT SHORT TERM GOAL #2   Title  Patient will undergo further assessment of TUG with use of RW vs. BERG  in order to determine fall risk - goal written as appropriate.    Baseline  not yet assessed.    Time  4    Period  Weeks    Status  New      PT SHORT TERM GOAL #3   Title  Patient will verbalize understanding of fall prevention strategies in the home.    Time  4    Period  Weeks    Status  New      PT SHORT TERM GOAL #4   Title  Patient will ambulate at least 591 with RW with supervision in order to safely ambulate at home.    Baseline  not yet assessed.    Time  4    Period  Weeks    Status  New      PT SHORT TERM GOAL #5   Title  Patient will perform 4 steps with supervision using single railing and step to pattern in order to increase safety at home with stairs.    Baseline  not yet assessed.    Time  4    Period  Weeks    Status  New  PT SHORT TERM GOAL #6   Title  Perform 5x sit to stand from standard arm chair vs. mat table and write goal as appropriate.    Time  4    Period  Weeks    Status  New        PT Long Term Goals - 04/05/19 2229      PT LONG TERM GOAL #1   Title  Pt will be independent with final HEP for land/aquatic therapy for improved strength, ROM, balance, and gait. ALL LTGS DUE 05/04/19    Time  8   due to delay in scheduling   Period  Weeks    Status  New      PT LONG TERM GOAL #2   Title  BERG vs. TUG score to be written as appropriate to determine fall risk.    Time  8    Period  Weeks    Status  New      PT LONG TERM GOAL #3   Title  Patient will ambulate at least 100' with RW with mod I in order to safely ambulate at home.    Baseline  not yet assessed.    Time  8    Period  Weeks    Status  New      PT LONG TERM GOAL #4   Title  Patient will perform 12 steps with supervision using single railing and step to pattern in order to increase safety at home with stairs.    Baseline  not yet assessed.    Time  8    Period   Weeks    Status  New      PT LONG TERM GOAL #5   Title  Patient will perform bed mobility, stap step transfers with RW with mod I in order to decrease caregiver burden.    Baseline  not yet assessed.    Time  8    Period  Weeks    Status  New      PT LONG TERM GOAL #6   Title  5x sit <> stand goal to be written as appropriate.    Time  8    Period  Weeks    Status  New            Plan - 04/05/19 2226    Clinical Impression Statement  Pt tolerated aquatic exercises well - pt able to increase # reps for PRE's from 10 to 15 reps bil. LE's for hip strengthening.  Pt continues to demonstrate improved posture at end of session after stretching and exs/activities increasing flexibility.    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 PMH: Psoriatic arthritis, Polycythemia vera, CVA, s/p Rt TKR 08/23/18, Lt knee revision 04/2017, lumbar radiculitis, multiple scaphoid fx's Rt thumb, chronic pain    Examination-Activity Limitations  Transfers;Stairs;Locomotion Level;Stand    Examination-Participation Restrictions  Community Activity    Stability/Clinical Decision Making  Evolving/Moderate complexity    Rehab Potential  Good    PT Frequency  2x / week    PT Duration  6 weeks    PT Treatment/Interventions  ADLs/Self Care Home Management;Neuromuscular re-education;Therapeutic activities;Therapeutic exercise;Patient/family education;Manual techniques;Scar mobilization;Passive range of motion;Manual lymph drainage;Electrical Stimulation;Aquatic Therapy;Energy conservation;Gait training;Stair training;Functional mobility training;DME Instruction;Balance training;Orthotic Fit/Training    PT Next Visit Plan  continue LE strengthening, gait training (walking around obstacles), assess stairs. perform 5x sit <> stand and  TUG, standing balance in // bars, fall prevention strategy handout.    PT Home Exercise  Plan  [id number]    Consulted and Agree with Plan of Care  Patient       Patient will benefit from skilled therapeutic intervention in order to improve the following deficits and impairments:  Decreased range of motion, Decreased endurance, Decreased activity tolerance, Abnormal gait, Difficulty walking, Decreased coordination, Decreased balance, Decreased strength, Pain, Decreased mobility, Decreased safety awareness  Visit Diagnosis: Other abnormalities of gait and mobility  Muscle weakness (generalized)  Unsteadiness on feet     Problem List Patient Active Problem List   Diagnosis Date Noted  . Osteoarthritis of right knee 08/23/2018  . Constipation due to opioid therapy 03/30/2018  . Retinopathy of both eyes 01/06/2018  . SNHL (sensorineural hearing loss) 12/04/2017  . Abnormal urinary stream 12/03/2017  . Osteoarthritis of carpometacarpal (CMC) joint of thumb 11/30/2017  . Muscle weakness 11/17/2017  . Transient vision disturbance 11/12/2017  . Bilateral hand pain 10/30/2017  . Pain of left hip joint 10/09/2017  . Gynecomastia 07/10/2017  . Iron deficiency anemia 07/05/2017  . Spasticity 05/20/2017  . Lumbar radiculitis 04/20/2017  . Ulnar 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, left 11/28/2016  . Idiopathic peripheral neuropathy 11/28/2016  . 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 Long term (current) use of anticoagulants 08/23/2015  . Right upper quadrant abdominal pain 08/23/2015  . Memory loss 05/10/2015  . Gait abnormality 04/07/2015  . Alkaline phosphatase elevation 04/07/2015  . History of thrombosis 03/26/2015  . Medial epicondylitis 02/07/2015  . Cognitive decline 12/21/2014  . Leukopenia 12/05/2014  . Rotator cuff syndrome of right shoulder 10/27/2014  . Status post left knee replacement 08/22/2014  . Left lateral epicondylitis 08/22/2014  . Chronic cerebral ischemia 08/18/2014  . Arthrofibrosis of knee joint 08/17/2014  . Cubital canal compression  syndrome, left 08/17/2014  . 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 Chronic pain syndrome 04/10/2014  . Insomnia 04/10/2014  . Chronic non-specific white matter lesions on MRI 04/10/2014  . CFS (chronic fatigue syndrome) 04/10/2014  . 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 Biceps tendonitis on left 03/01/2014  . Polycythemia vera ([place]) 12/27/2013  . H/O TIA (transient ischemic attack) and stroke 12/27/2013  . Neck pain 12/27/2013  . OSA (obstructive sleep apnea) 12/08/2013  . 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 Depression with anxiety 08/04/2013  . Protein C deficiency ([place]) 08/01/2013  . Post traumatic stress disorder (PTSD) 08/01/2013  . Speech abnormality 07/25/2013  . Obesity 05/10/2013  . Lower extremity edema 05/10/2013  . GERD (gastroesophageal reflux disease) 05/10/2013  . Arthritis 05/10/2013  . OA (osteoarthritis) of knee 03/15/2013  . Headache 10/25/2012  . Palpitations 10/18/2012  . Fatty liver determined by biopsy 06/01/2012  . Arthropathic psoriasis, unspecified ([place]) 04/29/2012  . Abnormal liver enzymes 03/29/2012  . Psoriatic arthritis ([place]) 12/29/2011  . Left Renal Hydronephrosis 12/11/2010  . 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 Celiac disease 05/27/2010  . Thyroid nodule 05/27/2010  . Male-to-male transgender person 09/20/2002    Alda [name], [place], [place] 04/05/2019, 10:31 PM  [facility] [address] [place], [place], [address] Phone: [phone]   Fax:  [phone]  Name: Traeger Sultana MRN: [id number] Date of Birth: [birth year]

## 2019-04-06 ENCOUNTER — Ambulatory Visit: Payer: BC Managed Care – PPO | Admitting: Physical Therapy

## 2019-04-06 ENCOUNTER — Telehealth: Payer: Self-pay | Admitting: Hematology

## 2019-04-06 ENCOUNTER — Inpatient Hospital Stay: Payer: BC Managed Care – PPO | Admitting: Hematology

## 2019-04-06 ENCOUNTER — Other Ambulatory Visit: Payer: Self-pay

## 2019-04-06 ENCOUNTER — Telehealth: Payer: Self-pay

## 2019-04-06 ENCOUNTER — Other Ambulatory Visit: Payer: BC Managed Care – PPO

## 2019-04-06 ENCOUNTER — Ambulatory Visit: Payer: BC Managed Care – PPO | Admitting: Occupational Therapy

## 2019-04-06 ENCOUNTER — Inpatient Hospital Stay: Payer: BC Managed Care – PPO

## 2019-04-06 ENCOUNTER — Encounter: Payer: Self-pay | Admitting: Hematology

## 2019-04-06 DIAGNOSIS — R29818 Other symptoms and signs involving the nervous system: Secondary | ICD-10-CM

## 2019-04-06 DIAGNOSIS — R2689 Other abnormalities of gait and mobility: Secondary | ICD-10-CM | POA: Diagnosis not present

## 2019-04-06 DIAGNOSIS — R29898 Other symptoms and signs involving the musculoskeletal system: Secondary | ICD-10-CM

## 2019-04-06 NOTE — Progress Notes (Addendum)
[facility]   [phone] [phone]   Clinic Follow up Note   Patient Care Team: Shawnee Knapp, MD as PCP - General (Family Medicine) Sanda Klein, MD as PCP - Cardiology (Cardiology) Bernadene Bell, MD (Inactive) as Consulting Physician (Hematology) Sheryn Bison, MD as Referring Physician (Dermatology) Elease Hashimoto, Trixie Deis, MD as Referring Physician (Gastroenterology) [name], MD as Consulting Physician (Hematology) Rozetta Nunnery, MD as Consulting Physician (Otolaryngology) Doree Fudge, PhD as Consulting Physician (Psychology) Lendon Colonel, MD as Referring Physician (Psychiatry) Meredith Staggers, MD as Consulting Physician (Physical Medicine and Rehabilitation) Hermelinda Medicus, MD as Consulting Physician (Rheumatology) [name], MD as Consulting Physician (Orthopedic Surgery) Justice Britain, MD as Consulting Physician (Orthopedic Surgery) Hollice Espy, MD as Consulting Physician (Urology) Shawnee Knapp, MD (Family Medicine)  Date of Service:  04/07/2019  CHIEF COMPLAINT: cervical adenopathy    CURRENT THERAPY:  Xarelto 20 mg once daily, phlebotomy if hematocrit above 46%  INTERVAL HISTORY:  Jesse Bowers is here to discuss multiple concerns which he communicated with [place] through [place]. Main concern is his recent cervical adenopathy.  It was first noticed about a month ago, he felt a few enlarged lymph node in the right supraclavicular area.  He was seen by his primary care physician Dr. Manuella Ghazi, and a CT chest was ordered, and scheduled for next week.  He also reports a few small lump along his upper arms, he thought they were enlarged node also. He is on Cosentyx and is concerned about his risk of lymphoma.   He was last seen by me 5 months ago. He presents to the clinic alone.  He does PT 2 times a week and OT once a week for his neurotherapy. This has been non-weight bearing due to left knee and spinal stenosis. He is  working on strengthen muscles. He notes he ambulates about 10 feet at home with walker or holding onto wall. He notes he did have a fall in 03/2019. I reviewed medication list with him. His topical meds are as needed. He is still on 22m Lasix, 211mpotassium, Morphine long acting BID and Oxycodone 1055mID. He notes his baseline pain is 5/10. He is on Effexor.  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 [facility]     REVIEW OF SYSTEMS:   Constitutional: Denies fevers, chills or abnormal weight loss Eyes: Denies blurriness of vision Ears, nose, mouth, throat, and face: Denies mucositis or sore throat Respiratory: Denies cough, dyspnea or wheezes Cardiovascular: Denies palpitation, chest discomfort or lower extremity swelling Gastrointestinal:  Denies nausea, heartburn or change in bowel habits Skin: Denies abnormal skin rashes Lymphatics: Denies new lymphadenopathy or easy bruising Neurological: (+) Left leg weakness and unilateral weakness from spinal stenosis.  Behavioral/Psych: Mood is stable, no new changes  All other systems were reviewed with the patient and are negative.  MEDICAL HISTORY:  Past Medical History:  Diagnosis Date  . Abnormal weight loss   . Anxiety   . Arthritis   . Cataract    OU  . Celiac disease   . Cervical neck pain with evidence of disc disease    patient has a cyst   . Chronic constipation   . Chronic diastolic heart failure ([place]  Pt. denies  . Chronic pain   . Degenerative disc disease at L5-S1 level    with stenosis  . DVT (deep venous thrombosis) ([place]  Right upper arm, bilateral leg  . Eczema    inguinal, feet  . Elevated liver enzymes   . 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 Family history of adverse reaction to anesthesia    family has problems with anesthesia of nausea and vomiting   . 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 GERD (gastroesophageal reflux disease)     History of in 20's  . Gluten enteropathy   . H/O parotitis    right   . Hard of hearing   . History of kidney stones   . History of retinal tear    Bilateral  . History of staph infection    required wound vac  . Hx-TIA (transient ischemic attack)    2015  . LVH (left ventricular hypertrophy) 12/15/2016   Mild, noted on ECHO  . MVP (mitral valve prolapse)   . NAFL (nonalcoholic fatty liver)   . Neck pain   . Neuromuscular disorder ([place])    bilateral neuropathy feet.  . Pneumonia 12/17/2010  . Polycythemia   . Polycythemia, secondary   . PONV (postoperative nausea and vomiting)   . Protein C deficiency ([place])    Dr. Anne Fu  . Psoriasis    16 X10 cm psoriatic rash on sole of left foot ; open and occ scant bleeding;   . psoriatic arthritis   . PTSD (post-traumatic stress disorder)   . Scaphoid fracture of wrist 09/23/2013  . Seizure ([place])    childhood, medication until age 47 then weanned completely off  . Sleep apnea    split night study last done by Dr. [name] 06/18/15 shows severe OSA, CSA, and hypersomnia, rec bipap  . Splenomegaly   . Stenosis of ureteropelvic junction (UPJ)    left  . Stroke [facility])    CVA vs TIA in left cerebrum causing slight right sided weakness-Dr. [name] follows  . 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 Tachycardia    hx of   . Transfusion history    past history- none recent, after surgeries due to blood loss  . Wears glasses   . Wears hearing aid     SURGICAL HISTORY: Past Surgical History:  Procedure Laterality Date  . ABDOMINAL HYSTERECTOMY Bilateral 1994   TAH, BSO- tranverse incision at 54 yo  . ANKLE ARTHROSCOPY WITH RECONSTRUCTION Right 2007  . CHOLECYSTECTOMY     laparoscopic  . COLONOSCOPY     x3  . EYE SURGERY     Left eye 03/02/2018, right 02/15/2018  . HIP ARTHROSCOPY W/ LABRAL REPAIR Right 05/11/2013   acetabular labral tear 03/30/2013  . KNEE ARTHROPLASTY Right   . KNEE JOINT MANIPULATION Left      x3 under anesthesia  . KNEE SURGERY Bilateral 1984   Right ACL, left PCL repair  . LITHOTRIPSY  2005  . LIVER BIOPSY  2013   normal results.  [name] MASTECTOMY Bilateral    prior to 2009  . MOUTH SURGERY    . NASAL SEPTUM SURGERY N/A 09/20/2015   by ENT Dr. [name]  . OVARIAN CYST SURGERY Left    size of grapefruit, was informed that she had shortened vagina  . SHOULDER SURGERY Bilateral    Right 08/15/2016, Left 11/15/2016  . THUMB ARTHROSCOPY Left   . THYROIDECTOMY, PARTIAL Left 2008  . TOTAL KNEE ARTHROPLASTY Right 08/23/2018   Procedure: TOTAL KNEE ARTHROPLASTY;  Surgeon: [name], MD;  Location: [facility];  Service: Orthopedics;  Laterality: Right;  63mn  . TOTAL KNEE REVISION Left 02/06/2016   Procedure: LEFT TOTAL KNEE REVISION;  Surgeon: FGaynelle Arabian MD;  Location: [facility];  Service: Orthopedics;  Laterality: Left;  . TOTAL KNEE REVISION Left 04/22/2017   Procedure: Left knee polyethylene revision;  Surgeon: AGaynelle Arabian MD;  Location: [facility];  Service: Orthopedics;  Laterality: Left;  . UPPER GI ENDOSCOPY  2003    I have reviewed the social history and family history with the patient and they are unchanged from previous note.  ALLERGIES:  is allergic to gabapentin; penicillin g; sulfa antibiotics; tegaderm chg dressing  [chlorhexidine]; vancomycin; duloxetine; tegaderm ag mesh [silver]; citalopram; wheat bran; ibuprofen; nortriptyline; pregabalin; and sulfacetamide sodium-sulfur.  MEDICATIONS:  Current Outpatient Medications  Medication Sig Dispense Refill  . acyclovir ointment (ZOVIRAX) 5 % Apply 1 application topically daily as needed (cold sores).   0  . antiseptic oral rinse (BIOTENE) LIQD 15 mLs by Mouth Rinse route as needed for dry mouth.    .Marland Kitchenaugmented betamethasone dipropionate (DIPROLENE-AF) 0.05 % ointment Apply 1 application topically 2 (two) times daily.    . baclofen (LIORESAL) 20 MG tablet Take 1 tablet (20 mg total) by mouth 4 (four) times daily. 120  tablet 3  . Calcipotriene-Betameth Diprop (ENSTILAR) 0.005-0.064 % FOAM Apply 1 application topically 2 (two) times daily.     . clobetasol (TEMOVATE) 0.05 % GEL Apply 1 application topically 2 (two) times daily as needed (itching).    . clorazepate (TRANXENE) 15 MG tablet Take 15 mg by mouth at bedtime.    . clorazepate (TRANXENE) 7.5 MG tablet Take 7.5 mg by mouth 2 (two) times daily.     .Marland Kitchendesonide (DESOWEN) 0.05 % ointment Apply 1 application topically 2 (two) times daily as needed (psoriasis).   2  . diclofenac sodium (VOLTAREN) 1 % GEL APPLY TOPICALLY TO BOTH HANDS 3 TIMES DAILY. 300 g 4  . Emollient (CERAVE) CREA Apply 1 application topically 2 (two) times a day.    . Fluocinolone Acetonide 0.01 % OIL Place 3 drops into both ears 2 (two) times daily as needed for itching.    . furosemide (LASIX) 20 MG tablet Take 1 tablet (20 mg total) by mouth daily. To be taken with the 40 mg to equal 60 mg daily 30 tablet 5  . furosemide (LASIX) 40 MG tablet Take 1 tablet (40 mg total) by mouth daily. To be taken with the 20 mg to equal 60 mg daily. 30 tablet 5  . mirabegron ER (MYRBETRIQ) 50 MG TB24 tablet Take 1 tablet (50 mg total) by mouth daily. 30 tablet 11  . morphine (KADIAN) 60 MG 24 hr capsule Take 1 capsule (60 mg total) by mouth every 12 (twelve) hours. 60 capsule 0  . NEEDLE, DISP, 18 G 18G X 1-1/2" MISC 1 Units by Does not apply route once a week. 50 each 2  . NEEDLE, DISP, 23 G 23G X 3/4" MISC 1 Units by Does not apply route once a week. 100 each 1  . neomycin-polymyxin-hydrocortisone (CORTISPORIN) 3.5-10000-1 OTIC suspension Place 4 drops into both ears 2 (two) times daily as needed (ear pain).   1  . Oxycodone HCl 10 MG TABS Take 1 tablet (10 mg total) by mouth every 8 (eight) hours as needed. 90 tablet 0  . potassium chloride (KLOR-CON) 10 MEQ tablet TAKE K-DUR 2 TABLETS 10 MEQ DAILY 60 tablet 11  . prazosin (MINIPRESS) 2 MG capsule Take 4 mg by mouth at bedtime.     .[name]  Propylene Glycol  (SYSTANE COMPLETE) 0.6 % SOLN Place 1-2 drops into both eyes 4 (four) times daily as needed (dry eyes).     . rivaroxaban (XARELTO) 20 MG TABS tablet Take 1 tablet (20 mg total) by mouth at bedtime. TAKE 1 TABLET DAILY BEFORE BEDTIME 90 tablet 3  . Secukinumab (COSENTYX SENSOREADY PEN) 150 MG/ML SOAJ Inject 300 mg into the skin every 28 (twenty-eight) days.     . Syringe, Disposable, 1 ML MISC 1 Units by Does not apply route once a week. 60 each 2  . testosterone cypionate (DEPOTESTOSTERONE CYPIONATE) 200 MG/ML injection INJECT 0.5 MLS (100 MG TOTAL) INTO THE MUSCLE ONCE A WEEK. 10 mL 0  . ursodiol (ACTIGALL) 250 MG tablet Take 250 mg by mouth 2 (two) times daily with a meal.     . ursodiol (ACTIGALL) 500 MG tablet Take 500 mg by mouth 2 (two) times daily with a meal.     . venlafaxine XR (EFFEXOR-XR) 150 MG 24 hr capsule Take 2 capsules (300 mg total) by mouth daily with breakfast. 60 capsule 5   No current facility-administered medications for this visit.    PHYSICAL EXAMINATION: ECOG PERFORMANCE STATUS: 3 - Symptomatic, >50% confined to bed  Vitals:   04/07/19 1611  BP: (!) 147/96  Pulse: (!) 106  Resp: 17  Temp: 98.2 F (36.8 C)  SpO2: 99%   Filed Weights   04/07/19 1611  Weight: 207 lb 14.4 oz (94.3 kg)    GENERAL:alert, no distress and comfortable SKIN: skin color, texture, turgor are normal, no rashes or significant lesions EYES: normal, Conjunctiva are pink and non-injected, sclera clear  NECK: supple, thyroid normal size, non-tender, without nodularity LYMPH:  no palpable lymphadenopathy in the cervical, [place], or axillary. (+) Mildly palpable left  submandibular LN LUNGS: clear to auscultation and percussion with normal breathing effort HEART: regular rate & rhythm and no murmurs and no lower extremity edema ABDOMEN:abdomen soft, non-tender and normal bowel sounds Musculoskeletal:no cyanosis of digits and no clubbing  NEURO: alert & oriented x 3 with fluent speech, no  focal motor/sensory deficits BREAST: S/p b/l mastectomy: Surgical incisions healed well (+) Soft tissue lumpiness of left chest. And  inner upper left arm. No palpable mass, nodules or adenopathy bilaterally. Breast exam benign.   LABORATORY DATA:  I have reviewed the data as listed CBC Latest Ref Rng & Units 03/11/2019 02/23/2019 11/24/2018  WBC 4.0 - 10.5 K/uL 4.7 4.4 5.3  Hemoglobin 13.0 - 17.0 g/dL 13.6 14.8 13.9  Hematocrit 39.0 - 52.0 % 44.8 48.5 46.1  Platelets 150 - 400 K/uL 232 222 233     CMP Latest Ref Rng & Units 02/23/2019 08/25/2018 08/24/2018  Glucose 70 - 99 mg/dL 104(H) 138(H) 178(H)  BUN 6 - 20 mg/dL 14 13 19   Creatinine 0.61 - 1.24 mg/dL 1.09 0.84 0.96  Sodium 135 - 145 mmol/L 138 133(L) 135  Potassium 3.5 - 5.1 mmol/L 3.9 3.6 3.9  Chloride 98 - 111 mmol/L 102 99 101  CO2 22 - 32 mmol/L 26 23 25   Calcium 8.9 - 10.3 mg/dL 8.8(L) 8.1(L) 8.3(L)  Total Protein 6.5 - 8.1 g/dL 7.0 - -  Total Bilirubin 0.3 - 1.2 mg/dL 0.5 - -  Alkaline Phos 38 - 126 U/L 227(H) - -  AST 15 - 41 U/L 23 - -  ALT 0 - 44 U/L 22 - -      RADIOGRAPHIC STUDIES: I have personally reviewed the radiological images as listed and agreed with  the findings in the report. No results found.   ASSESSMENT & PLAN:  Jesse Bowers is a 54 y.o. adult with   1. ?  Supraclavicular adenopathy -Patient reports self palpable right supraclavicular adenopathy, which is probably resolved now. No recently history of URI or skin infection.  -He is still concerned about edema of the upper chest wall, above mastectomy incision. Exam was negative for palpable mass or adenopathy.  He is scheduled for mammogram tomorrow. -My exam did not find any palpable lymph nodes in the neck, supraclavicular, upper arm or axillary -His PCP Dr. Brigitte Pulse has scheduled him for a CT chest next week, I will follow the result  -He has no B symptoms, I reassured him that I do not have clinical suspicion for lymphoma  2.  Polycythemia JAK2  (-) -Per patient, he has had polycythemia since his 12s.  His bone marrow biopsy in 2016 was unremarkable, JAK2 mutation was negative  -he is a transgender.  He receives testosterone injections weekly. He currently undergoes phlebotomy as needed.  -Patient views better after phlebotomy, he would like to use Hct 46 or above for phlebotomy, which is reasonable.  We discussed phlebotomy induced iron deficiency, which he already has. -His 03/11/19 CBC WNL except MCV 78.7, MCH 23.9. However, he feels physically better after phlebotomy will do phlebotomy if HCT >46%. Will watch for anemia.   -f/u in 6 weeks    3. History of DVT and stroke, Protein C deficiency -Currently on Xarelto and will continue indefinitely.  -This was held for 4 days for his right knee surgery. He did have mild to moderate bleeding from surgery.  -He notes he now has occasional bruising and mild gum bleeding. I encouraged him to avoid fall or injury.   4 History of CVA  5. 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 Brigitte Pulse -He know to keephis testosterone level low, due to previous history of DVT and stroke  6. RA -Currently onSecukinumab (COSENTYX). Continue -He notes his Psoriasis is flaring due to treatment. This is present on scalp, nose, groin and genitals.   6. History of Telangiectasia macularis eruptiva perstans (TMEP)  -per pt, he was diagnosed in [facility] in 2012, he presented with skin rash, and the biopsy confirmed.  I do not have records available. He has no typic skin presentation of TMEP now  -I do not see any clinical evidence of systemic mastocytosis.  -will monitor   7. Back Pain and muscular spasms  -Currently on Morphine 60 mg every 12 hrs, Oxycodone 10 mg as needed (BID), and baclofen. -f/u with Dr. [name]. Stable.   8. Mild Splenomegaly  -03/30/19 US shows 14cm. Previous normal spleen size on CT in 2017 -Continue to monitor.   9.  Gynecomastia -s/p bilateral mastectomies 14 years ago   58. S/p right knee replacement  -Done in 08/2018 -He has healed well and still undergoing neuro PT, OT.  -He can ambulate mildly with walker. He is still uses wheelchair just as much.  -He still feels his right leg is better than his left.     11. Polyuria -Managed by urologist.  -He is on myrbetriq, dose adjusted. He has had low urinary output lately.     Plan -Lab, f/u and phlebotomy for HCT>46%, next appointment in late March -He is scheduled for mammogram tomorrow, and a CT chest next week, I will follow up the results   No problem-specific Assessment & Plan notes found for this  encounter.   No orders of the defined types were placed in this encounter.  All questions were answered. The patient knows to call the clinic with any problems, questions or concerns. No barriers to learning was detected. The total time spent in the appointment was 30 minutes.     [name], MD 04/07/2019   I, Joslyn Devon, am acting as scribe for [name], MD.   I have reviewed the above documentation for accuracy and completeness, and I agree with the above.

## 2019-04-06 NOTE — Telephone Encounter (Signed)
Rescheduled appt per 2/3 sch messgae.  Spoke with pt and he is aware of his new appt date and time.

## 2019-04-06 NOTE — Therapy (Signed)
[facility] [address] [place], [place], [address] Phone: [phone]   Fax:  [phone]  Occupational Therapy Treatment  Patient Details  Name: Jesse Bowers MRN: [id number] Date of Birth: May 12, 1965 Referring Provider (OT): Dr. [name]   Encounter Date: 04/06/2019  OT End of Session - 04/06/19 1107    Visit Number  4    Number of Visits  13    Date for OT Re-Evaluation  04/23/19    Authorization Type  BC/BS primary, MCR secondary (however may lose part B) - 30 visit limit combined PT/OT (Same day counts as 1 visit)    Authorization Time Period  Plan goes from 11/2018-05/01/19    Authorization - Visit Number  4    Authorization - Number of Visits  15    OT Start Time  0950    OT Stop Time  1015    OT Time Calculation (min)  25 min    Activity Tolerance  Patient tolerated treatment well    Behavior During Therapy  [facility] for tasks assessed/performed       Past Medical History:  Diagnosis Date  . Abnormal weight loss   . Anxiety   . Arthritis   . Cataract    OU  . Celiac disease   . Cervical neck pain with evidence of disc disease    patient has a cyst   . Chronic constipation   . Chronic diastolic heart failure ([place])    Pt. denies  . Chronic pain   . Degenerative disc disease at L5-S1 level    with stenosis  . DVT (deep venous thrombosis) ([facility])    Right upper arm, bilateral leg  . Eczema    inguinal, feet  . Elevated liver enzymes   . 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 Family history of adverse reaction to anesthesia    family has problems with anesthesia of nausea and vomiting   . 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 GERD (gastroesophageal reflux disease)    History of in 20's  . Gluten enteropathy   . H/O parotitis    right   . Hard of hearing   . History of kidney stones   . History of retinal tear    Bilateral  . History of staph infection    required wound vac  . Hx-TIA  (transient ischemic attack)    2015  . LVH (left ventricular hypertrophy) 12/15/2016   Mild, noted on ECHO  . MVP (mitral valve prolapse)   . NAFL (nonalcoholic fatty liver)   . Neck pain   . Neuromuscular disorder ([place])    bilateral neuropathy feet.  . Pneumonia 12/17/2010  . Polycythemia   . Polycythemia, secondary   . PONV (postoperative nausea and vomiting)   . Protein C deficiency ([place])    Dr. Anne Fu  . Psoriasis    16 X10 cm psoriatic rash on sole of left foot ; open and occ scant bleeding;   . psoriatic arthritis   . PTSD (post-traumatic stress disorder)   . Scaphoid fracture of wrist 09/23/2013  . Seizure ([place])    childhood, medication until age 80 then weanned completely off  . Sleep apnea    split night study last done by Dr. [name] 06/18/15 shows severe OSA, CSA, and hypersomnia, rec bipap  . Splenomegaly   . Stenosis of ureteropelvic junction (UPJ)    left  . Stroke [facility])    CVA vs TIA in left cerebrum causing  slight right sided weakness-Dr. [name] follows  . Syrinx of spinal cord ([place]) 01/06/2014   c spine on MRI  . Tachycardia    hx of   . Transfusion history    past history- none recent, after surgeries due to blood loss  . Wears glasses   . Wears hearing aid     Past Surgical History:  Procedure Laterality Date  . ABDOMINAL HYSTERECTOMY Bilateral 1994   TAH, BSO- tranverse incision at 54 yo  . ANKLE ARTHROSCOPY WITH RECONSTRUCTION Right 2007  . CHOLECYSTECTOMY     laparoscopic  . COLONOSCOPY     x3  . EYE SURGERY     Left eye 03/02/2018, right 02/15/2018  . HIP ARTHROSCOPY W/ LABRAL REPAIR Right 05/11/2013   acetabular labral tear 03/30/2013  . KNEE ARTHROPLASTY Right   . KNEE JOINT MANIPULATION Left    x3 under anesthesia  . KNEE SURGERY Bilateral 1984   Right ACL, left PCL repair  . LITHOTRIPSY  2005  . LIVER BIOPSY  2013   normal results.  [name] MASTECTOMY Bilateral    prior to 2009  . MOUTH SURGERY    . NASAL SEPTUM SURGERY N/A  09/20/2015   by ENT Dr. [name]  . OVARIAN CYST SURGERY Left    size of grapefruit, was informed that she had shortened vagina  . SHOULDER SURGERY Bilateral    Right 08/15/2016, Left 11/15/2016  . THUMB ARTHROSCOPY Left   . THYROIDECTOMY, PARTIAL Left 2008  . TOTAL KNEE ARTHROPLASTY Right 08/23/2018   Procedure: TOTAL KNEE ARTHROPLASTY;  Surgeon: [name], MD;  Location: [facility];  Service: Orthopedics;  Laterality: Right;  12mn  . TOTAL KNEE REVISION Left 02/06/2016   Procedure: LEFT TOTAL KNEE REVISION;  Surgeon: FGaynelle Arabian MD;  Location: [facility];  Service: Orthopedics;  Laterality: Left;  . TOTAL KNEE REVISION Left 04/22/2017   Procedure: Left knee polyethylene revision;  Surgeon: AGaynelle Arabian MD;  Location: [facility];  Service: Orthopedics;  Laterality: Left;  . UPPER GI ENDOSCOPY  2003    There were no vitals filed for this visit.  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 PMH: Psoriatic arthritis, CVA, s/p Rt TKR 08/23/18, Lt knee revision 04/2017, lumbar radiculitis, multiple scaphoid fx's Rt thumb    Limitations  fall risk    Patient Stated Goals  cut my food, holding fork and pen/pencil better w/ less pain in Rt hand    Currently in Pain?  Yes    Pain Score  4     Pain Location  --   bilateral thumbs and Rt wrist   Pain Orientation  Right;Left    Pain Descriptors / Indicators  Sore;Aching    Pain Type  Chronic pain    Pain Onset  More than a month ago    Pain Frequency  Constant    Aggravating Factors   overuse, wrist flex, add/abd of thumb    Pain Relieving Factors  rest, heat       Pt arrived 17 min. Late today Fabricated and fitted short thumb spica splint (hand based) for better positioning and comfort Lt thumb. Pt reports feeling better. Reviewed wear and care of splint. Also discussed plan for next session. Pt reports seeing ortho MD Friday  to address Rt thumb small fx and reports may need surgery.  OT Education - 04/06/19 1107    Education Details  splint wear and care    Person(s) Educated  Patient    Methods  Explanation    Comprehension  Verbalized understanding       OT Short Term Goals - 04/06/19 1108      OT SHORT TERM GOAL #1   Title  Independent with UE strengthening HEP (shoulders and hands)    Time  3    Period  Weeks    Status  On-going      OT SHORT TERM GOAL #2   Title  Pt to verbalize understanding with potential A/E needs to increase ease and independence with ADLS (specifically cutting food, writing)    Time  3    Period  Weeks    Status  On-going      OT SHORT TERM GOAL #3   Title  Pt to be assessed for potential splints to help reduce pain Rt thumb and Lt thumb prn    Time  3    Period  Weeks    Status  On-going        OT Long Term Goals - 03/08/19 1533      OT LONG TERM GOAL #1   Title  Pt to report greater ease with BADLS using A/E and compensatory strategies prn    Time  6    Period  Weeks    Status  New      OT LONG TERM GOAL #2   Title  Pt to improve bilateral grip strength by 5 lbs for gripping activities    Time  6    Period  Weeks    Status  New      OT LONG TERM GOAL #3   Title  Pt to perform snack prep/sandwich prep from standing level w/ rest breaks prn safely w/o LOB    Time  6    Period  Weeks    Status  New            Plan - 04/06/19 1113    Clinical Impression Statement  Pt reports better fitting Lt splint today    Occupational performance deficits (Please refer to evaluation for details):  ADL's;IADL's;Leisure;Social Participation    Body Structure / Function / Physical Skills  ADL;Dexterity;ROM;IADL;Body mechanics;Mobility;Coordination;FMC;Pain;UE functional use;Decreased knowledge of use of DME;Sensation    Rehab Potential  Good    OT Frequency  2x / week    OT Duration  6 weeks   or 12 visits over extended weeks  d/t scheduling conflicts   OT Treatment/Interventions  Self-care/ADL training;Therapeutic exercise;Functional Mobility Training;Aquatic Therapy;Neuromuscular education;Manual Therapy;Splinting;Therapeutic activities;Coping strategies training;Energy conservation;DME and/or AE instruction;Passive range of motion;Patient/family education;Moist Heat    Plan  adjustments to new Lt splint prn, initiate HEP for bilateral shoulders (may wait for further adjustments or new splint for Rt as MD is considering surgery)    Consulted and Agree with Plan of Care  Patient       Patient will benefit from skilled therapeutic intervention in order to improve the following deficits and impairments:   Body Structure / Function / Physical Skills: ADL, Dexterity, ROM, IADL, Body mechanics, Mobility, Coordination, FMC, Pain, UE functional use, Decreased knowledge of use of DME, Sensation       Visit Diagnosis: Other symptoms and signs involving the musculoskeletal system  Other symptoms and signs involving the nervous system    Problem List Patient Active Problem List   Diagnosis Date Noted  . Osteoarthritis  of right knee 08/23/2018  . Constipation due to opioid therapy 03/30/2018  . Retinopathy of both eyes 01/06/2018  . SNHL (sensorineural hearing loss) 12/04/2017  . Abnormal urinary stream 12/03/2017  . Osteoarthritis of carpometacarpal (CMC) joint of thumb 11/30/2017  . Muscle weakness 11/17/2017  . Transient vision disturbance 11/12/2017  . Bilateral hand pain 10/30/2017  . Pain of left hip joint 10/09/2017  . Gynecomastia 07/10/2017  . Iron deficiency anemia 07/05/2017  . Spasticity 05/20/2017  . Lumbar radiculitis 04/20/2017  . Ulnar 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, left 11/28/2016  . Idiopathic peripheral neuropathy 11/28/2016  . 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 Long term (current) use of anticoagulants 08/23/2015  . Right upper quadrant abdominal pain 08/23/2015  . Memory loss  05/10/2015  . Gait abnormality 04/07/2015  . Alkaline phosphatase elevation 04/07/2015  . History of thrombosis 03/26/2015  . Medial epicondylitis 02/07/2015  . Cognitive decline 12/21/2014  . Leukopenia 12/05/2014  . Rotator cuff syndrome of right shoulder 10/27/2014  . Status post left knee replacement 08/22/2014  . Left lateral epicondylitis 08/22/2014  . Chronic cerebral ischemia 08/18/2014  . Arthrofibrosis of knee joint 08/17/2014  . Cubital canal compression syndrome, left 08/17/2014  . 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 Chronic pain syndrome 04/10/2014  . Insomnia 04/10/2014  . Chronic non-specific white matter lesions on MRI 04/10/2014  . CFS (chronic fatigue syndrome) 04/10/2014  . 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 Biceps tendonitis on left 03/01/2014  . Polycythemia vera ([place]) 12/27/2013  . H/O TIA (transient ischemic attack) and stroke 12/27/2013  . Neck pain 12/27/2013  . OSA (obstructive sleep apnea) 12/08/2013  . 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 Depression with anxiety 08/04/2013  . Protein C deficiency ([place]) 08/01/2013  . Post traumatic stress disorder (PTSD) 08/01/2013  . Speech abnormality 07/25/2013  . Obesity 05/10/2013  . Lower extremity edema 05/10/2013  . GERD (gastroesophageal reflux disease) 05/10/2013  . Arthritis 05/10/2013  . OA (osteoarthritis) of knee 03/15/2013  . Headache 10/25/2012  . Palpitations 10/18/2012  . Fatty liver determined by biopsy 06/01/2012  . Arthropathic psoriasis, unspecified ([place]) 04/29/2012  . Abnormal liver enzymes 03/29/2012  . Psoriatic arthritis ([place]) 12/29/2011  . Left Renal Hydronephrosis 12/11/2010  . 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 Celiac disease 05/27/2010  . Thyroid nodule 05/27/2010  . 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 [place], [place], [address] Phone: [phone]   Fax:  [phone]  Name: Jesse Bowers MRN: [id number] Date of Birth: 20-Jun-1965

## 2019-04-07 ENCOUNTER — Inpatient Hospital Stay: Payer: BC Managed Care – PPO | Attending: Hematology | Admitting: Hematology

## 2019-04-07 ENCOUNTER — Encounter: Payer: Self-pay | Admitting: Hematology

## 2019-04-07 ENCOUNTER — Ambulatory Visit (INDEPENDENT_AMBULATORY_CARE_PROVIDER_SITE_OTHER): Payer: BC Managed Care – PPO | Admitting: Psychology

## 2019-04-07 ENCOUNTER — Other Ambulatory Visit: Payer: BC Managed Care – PPO

## 2019-04-07 ENCOUNTER — Other Ambulatory Visit: Payer: Self-pay

## 2019-04-07 VITALS — BP 147/96 | HR 106 | Temp 98.2°F | Resp 17 | Ht 69.0 in | Wt 207.9 lb

## 2019-04-07 DIAGNOSIS — Z9013 Acquired absence of bilateral breasts and nipples: Secondary | ICD-10-CM | POA: Insufficient documentation

## 2019-04-07 DIAGNOSIS — R590 Localized enlarged lymph nodes: Secondary | ICD-10-CM | POA: Insufficient documentation

## 2019-04-07 DIAGNOSIS — Z7901 Long term (current) use of anticoagulants: Secondary | ICD-10-CM | POA: Diagnosis not present

## 2019-04-07 DIAGNOSIS — D45 Polycythemia vera: Secondary | ICD-10-CM

## 2019-04-07 DIAGNOSIS — Z79899 Other long term (current) drug therapy: Secondary | ICD-10-CM | POA: Insufficient documentation

## 2019-04-07 DIAGNOSIS — R358 Other polyuria: Secondary | ICD-10-CM | POA: Diagnosis not present

## 2019-04-07 DIAGNOSIS — R161 Splenomegaly, not elsewhere classified: Secondary | ICD-10-CM | POA: Insufficient documentation

## 2019-04-07 DIAGNOSIS — Z791 Long term (current) use of non-steroidal anti-inflammatories (NSAID): Secondary | ICD-10-CM | POA: Diagnosis not present

## 2019-04-07 DIAGNOSIS — Z86718 Personal history of other venous thrombosis and embolism: Secondary | ICD-10-CM | POA: Diagnosis not present

## 2019-04-07 DIAGNOSIS — M549 Dorsalgia, unspecified: Secondary | ICD-10-CM | POA: Diagnosis not present

## 2019-04-07 DIAGNOSIS — M069 Rheumatoid arthritis, unspecified: Secondary | ICD-10-CM | POA: Insufficient documentation

## 2019-04-07 DIAGNOSIS — Z8673 Personal history of transient ischemic attack (TIA), and cerebral infarction without residual deficits: Secondary | ICD-10-CM | POA: Insufficient documentation

## 2019-04-07 DIAGNOSIS — D751 Secondary polycythemia: Secondary | ICD-10-CM | POA: Insufficient documentation

## 2019-04-07 DIAGNOSIS — F4323 Adjustment disorder with mixed anxiety and depressed mood: Secondary | ICD-10-CM

## 2019-04-08 ENCOUNTER — Ambulatory Visit: Payer: BC Managed Care – PPO

## 2019-04-08 ENCOUNTER — Telehealth: Payer: Self-pay | Admitting: Hematology

## 2019-04-08 ENCOUNTER — Ambulatory Visit: Payer: BC Managed Care – PPO | Admitting: Physical Therapy

## 2019-04-08 ENCOUNTER — Ambulatory Visit: Payer: BC Managed Care – PPO | Admitting: Occupational Therapy

## 2019-04-08 DIAGNOSIS — M79641 Pain in right hand: Secondary | ICD-10-CM

## 2019-04-08 DIAGNOSIS — R29898 Other symptoms and signs involving the musculoskeletal system: Secondary | ICD-10-CM

## 2019-04-08 DIAGNOSIS — R29818 Other symptoms and signs involving the nervous system: Secondary | ICD-10-CM

## 2019-04-08 DIAGNOSIS — M6281 Muscle weakness (generalized): Secondary | ICD-10-CM

## 2019-04-08 DIAGNOSIS — R2689 Other abnormalities of gait and mobility: Secondary | ICD-10-CM | POA: Diagnosis not present

## 2019-04-08 DIAGNOSIS — R278 Other lack of coordination: Secondary | ICD-10-CM

## 2019-04-08 NOTE — Telephone Encounter (Signed)
No los per 2/4.

## 2019-04-08 NOTE — Therapy (Signed)
[facility] [address] [place], [place], [address] Phone: [phone]   Fax:  [phone]  Occupational Therapy Treatment  Patient Details  Name: Jesse Bowers MRN: [id number] Date of Birth: 1965/12/19 Referring Provider (OT): Dr. [name]   Encounter Date: 04/08/2019  OT End of Session - 04/08/19 1243    Visit Number  5    Number of Visits  13    Date for OT Re-Evaluation  04/23/19    Authorization Type  BC/BS primary, MCR secondary (however may lose part B) - 30 visit limit combined PT/OT (Same day counts as 1 visit)    Authorization Time Period  Plan goes from 11/2018-05/01/19    Authorization - Visit Number  5    Authorization - Number of Visits  15    OT Start Time  1237    OT Stop Time  1315    OT Time Calculation (min)  38 min    Activity Tolerance  Patient tolerated treatment well    Behavior During Therapy  [facility] for tasks assessed/performed       Past Medical History:  Diagnosis Date  . Abnormal weight loss   . Anxiety   . Arthritis   . Cataract    OU  . Celiac disease   . Cervical neck pain with evidence of disc disease    patient has a cyst   . Chronic constipation   . Chronic diastolic heart failure ([place])    Pt. denies  . Chronic pain   . Degenerative disc disease at L5-S1 level    with stenosis  . DVT (deep venous thrombosis) (HCC)    Right upper arm, bilateral leg  . Eczema    inguinal, feet  . Elevated liver enzymes   . 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 Family history of adverse reaction to anesthesia    family has problems with anesthesia of nausea and vomiting   . 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 GERD (gastroesophageal reflux disease)    History of in 20's  . Gluten enteropathy   . H/O parotitis    right   . Hard of hearing   . History of kidney stones   . History of retinal tear    Bilateral  . History of staph infection    required wound vac  . Hx-TIA  (transient ischemic attack)    2015  . LVH (left ventricular hypertrophy) 12/15/2016   Mild, noted on ECHO  . MVP (mitral valve prolapse)   . NAFL (nonalcoholic fatty liver)   . Neck pain   . Neuromuscular disorder ([place])    bilateral neuropathy feet.  . Pneumonia 12/17/2010  . Polycythemia   . Polycythemia, secondary   . PONV (postoperative nausea and vomiting)   . Protein C deficiency ([place])    Dr. Anne Fu  . Psoriasis    16 X10 cm psoriatic rash on sole of left foot ; open and occ scant bleeding;   . psoriatic arthritis   . PTSD (post-traumatic stress disorder)   . Scaphoid fracture of wrist 09/23/2013  . Seizure ([place])    childhood, medication until age 44 then weanned completely off  . Sleep apnea    split night study last done by Dr. [name] 06/18/15 shows severe OSA, CSA, and hypersomnia, rec bipap  . Splenomegaly   . Stenosis of ureteropelvic junction (UPJ)    left  . Stroke [facility])    CVA vs TIA in left cerebrum causing  slight right sided weakness-Dr. [name] follows  . Syrinx of spinal cord ([place]) 01/06/2014   c spine on MRI  . Tachycardia    hx of   . Transfusion history    past history- none recent, after surgeries due to blood loss  . Wears glasses   . Wears hearing aid     Past Surgical History:  Procedure Laterality Date  . ABDOMINAL HYSTERECTOMY Bilateral 1994   TAH, BSO- tranverse incision at 54 yo  . ANKLE ARTHROSCOPY WITH RECONSTRUCTION Right 2007  . CHOLECYSTECTOMY     laparoscopic  . COLONOSCOPY     x3  . EYE SURGERY     Left eye 03/02/2018, right 02/15/2018  . HIP ARTHROSCOPY W/ LABRAL REPAIR Right 05/11/2013   acetabular labral tear 03/30/2013  . KNEE ARTHROPLASTY Right   . KNEE JOINT MANIPULATION Left    x3 under anesthesia  . KNEE SURGERY Bilateral 1984   Right ACL, left PCL repair  . LITHOTRIPSY  2005  . LIVER BIOPSY  2013   normal results.  [name] MASTECTOMY Bilateral    prior to 2009  . MOUTH SURGERY    . NASAL SEPTUM SURGERY N/A  09/20/2015   by ENT Dr. [name]  . OVARIAN CYST SURGERY Left    size of grapefruit, was informed that she had shortened vagina  . SHOULDER SURGERY Bilateral    Right 08/15/2016, Left 11/15/2016  . THUMB ARTHROSCOPY Left   . THYROIDECTOMY, PARTIAL Left 2008  . TOTAL KNEE ARTHROPLASTY Right 08/23/2018   Procedure: TOTAL KNEE ARTHROPLASTY;  Surgeon: [name], MD;  Location: [facility];  Service: Orthopedics;  Laterality: Right;  35mn  . TOTAL KNEE REVISION Left 02/06/2016   Procedure: LEFT TOTAL KNEE REVISION;  Surgeon: FGaynelle Arabian MD;  Location: [facility];  Service: Orthopedics;  Laterality: Left;  . TOTAL KNEE REVISION Left 04/22/2017   Procedure: Left knee polyethylene revision;  Surgeon: AGaynelle Arabian MD;  Location: [facility];  Service: Orthopedics;  Laterality: Left;  . UPPER GI ENDOSCOPY  2003    There were no vitals filed for this visit.  Subjective Assessment - 04/08/19 1238    Subjective   Pt reports that he saw Dr. OCaralyn Guiletoday and that he needs an MRI 2/13 and will likely need surgery to R hand ("clean out" due to bone chip).  Pt reports putty exercises are easy, but hurt with R hand.  Pt reports L splint doing ok except needs to be rolled back in 1 spot.--will bring next session.    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 PMH: Psoriatic arthritis, CVA, s/p Rt TKR 08/23/18, Lt knee revision 04/2017, lumbar radiculitis, multiple scaphoid fx's Rt thumb    Limitations  fall risk    Patient Stated Goals  cut my food, holding fork and pen/pencil better w/ less pain in Rt hand    Currently in Pain?  Yes    Pain Score  4     Pain Location  Wrist    Pain Orientation  Right;Lateral   ulnarly   Pain Descriptors / Indicators  Aching;Sore    Pain Type  Chronic pain    Pain Onset  More than a month ago    Pain Frequency  Constant    Aggravating Factors   overuse, wrist flex, add/abduction of thumb    Pain Relieving Factors  rest, heat          [facility] OT Assessment - 04/08/19 0001  Hand Function   Right Hand Grip (lbs)  21    Left Hand Grip (lbs)  16        Discussed MD visit for R hand and pt reports that adjustment to R hand splint helps, and new L hand splint is doing well except ?rubbing in one small area at tip of thumb (to bring next session).  Pt reports built-up/foam grips help with eating/writing.     OT Education - 04/08/19 1325    Education Details  Yellow theraband HEP bilateral UEs--see pt instructions.  Upgraded putty HEP to red (for L hand only, cautioned against putty exercises with R hand due to pain/possible plan for surgery).    Person(s) Educated  Patient    Methods  Explanation;Demonstration;Handout;Verbal cues    Comprehension  Verbalized understanding;Returned demonstration       OT Short Term Goals - 04/06/19 1108      OT SHORT TERM GOAL #1   Title  Independent with UE strengthening HEP (shoulders and hands)    Time  3    Period  Weeks    Status  On-going      OT SHORT TERM GOAL #2   Title  Pt to verbalize understanding with potential A/E needs to increase ease and independence with ADLS (specifically cutting food, writing)    Time  3    Period  Weeks    Status  On-going      OT SHORT TERM GOAL #3   Title  Pt to be assessed for potential splints to help reduce pain Rt thumb and Lt thumb prn    Time  3    Period  Weeks    Status  On-going        OT Long Term Goals - 03/08/19 1533      OT LONG TERM GOAL #1   Title  Pt to report greater ease with BADLS using A/E and compensatory strategies prn    Time  6    Period  Weeks    Status  New      OT LONG TERM GOAL #2   Title  Pt to improve bilateral grip strength by 5 lbs for gripping activities    Time  6    Period  Weeks    Status  New      OT LONG TERM GOAL #3   Title  Pt to perform snack prep/sandwich prep from standing level w/ rest breaks prn safely w/o LOB    Time  6    Period  Weeks    Status  New             Plan - 04/08/19 1243    Clinical Impression Statement  Pt able to tolerate progression of putty HEP for L hand and verbalized understanding/returned demo of shoulder/proximal UE HEP.    Occupational performance deficits (Please refer to evaluation for details):  ADL's;IADL's;Leisure;Social Participation    Body Structure / Function / Physical Skills  ADL;Dexterity;ROM;IADL;Body mechanics;Mobility;Coordination;FMC;Pain;UE functional use;Decreased knowledge of use of DME;Sensation    Rehab Potential  Good    OT Frequency  2x / week    OT Duration  6 weeks   or 12 visits over extended weeks d/t scheduling conflicts   OT Treatment/Interventions  Self-care/ADL training;Therapeutic exercise;Functional Mobility Training;Aquatic Therapy;Neuromuscular education;Manual Therapy;Splinting;Therapeutic activities;Coping strategies training;Energy conservation;DME and/or AE instruction;Passive range of motion;Patient/family education;Moist Heat    Plan  snack prep and AE/strategies for ADLs,adjustments to new Lt splint prn, (may wait for further  adjustments or new splint for Rt as MD is considering surgery)    Consulted and Agree with Plan of Care  Patient       Patient will benefit from skilled therapeutic intervention in order to improve the following deficits and impairments:   Body Structure / Function / Physical Skills: ADL, Dexterity, ROM, IADL, Body mechanics, Mobility, Coordination, FMC, Pain, UE functional use, Decreased knowledge of use of DME, Sensation       Visit Diagnosis: Other symptoms and signs involving the musculoskeletal system  Other symptoms and signs involving the nervous system  Muscle weakness (generalized)  Other lack of coordination  Pain in right hand    Problem List Patient Active Problem List   Diagnosis Date Noted  . Osteoarthritis of right knee 08/23/2018  . Constipation due to opioid therapy 03/30/2018  . Retinopathy of both eyes 01/06/2018  .  SNHL (sensorineural hearing loss) 12/04/2017  . Abnormal urinary stream 12/03/2017  . Osteoarthritis of carpometacarpal (CMC) joint of thumb 11/30/2017  . Muscle weakness 11/17/2017  . Transient vision disturbance 11/12/2017  . Bilateral hand pain 10/30/2017  . Pain of left hip joint 10/09/2017  . Gynecomastia 07/10/2017  . Iron deficiency anemia 07/05/2017  . Spasticity 05/20/2017  . Lumbar radiculitis 04/20/2017  . Ulnar 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, left 11/28/2016  . Idiopathic peripheral neuropathy 11/28/2016  . 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 Long term (current) use of anticoagulants 08/23/2015  . Right upper quadrant abdominal pain 08/23/2015  . Memory loss 05/10/2015  . Gait abnormality 04/07/2015  . Alkaline phosphatase elevation 04/07/2015  . History of thrombosis 03/26/2015  . Medial epicondylitis 02/07/2015  . Cognitive decline 12/21/2014  . Leukopenia 12/05/2014  . Rotator cuff syndrome of right shoulder 10/27/2014  . Status post left knee replacement 08/22/2014  . Left lateral epicondylitis 08/22/2014  . Chronic cerebral ischemia 08/18/2014  . Arthrofibrosis of knee joint 08/17/2014  . Cubital canal compression syndrome, left 08/17/2014  . 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 Chronic pain syndrome 04/10/2014  . Insomnia 04/10/2014  . Chronic non-specific white matter lesions on MRI 04/10/2014  . CFS (chronic fatigue syndrome) 04/10/2014  . 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 Biceps tendonitis on left 03/01/2014  . Polycythemia vera ([place]) 12/27/2013  . H/O TIA (transient ischemic attack) and stroke 12/27/2013  . Neck pain 12/27/2013  . OSA (obstructive sleep apnea) 12/08/2013  . 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 Depression with anxiety 08/04/2013  . Protein C deficiency ([place]) 08/01/2013  . Post traumatic stress disorder (PTSD) 08/01/2013  . Speech abnormality 07/25/2013  . Obesity 05/10/2013  . Lower extremity edema 05/10/2013  .  GERD (gastroesophageal reflux disease) 05/10/2013  . Arthritis 05/10/2013  . OA (osteoarthritis) of knee 03/15/2013  . Headache 10/25/2012  . Palpitations 10/18/2012  . Fatty liver determined by biopsy 06/01/2012  . Arthropathic psoriasis, unspecified ([place]) 04/29/2012  . Abnormal liver enzymes 03/29/2012  . Psoriatic arthritis ([place]) 12/29/2011  . Left Renal Hydronephrosis 12/11/2010  . 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 Celiac disease 05/27/2010  . Thyroid nodule 05/27/2010  . Male-to-male transgender person 09/20/2002    [facility] 04/08/2019, 1:27 PM  [facility] [address] [place], [place], [address] Phone: [phone]   Fax:  [phone]  Name: Jesse Bowers MRN: [id number] Date of Birth: Sep 12, 1965   Vianne Bulls, OTR/L [facility] [address] [place], [place]  [address] [phone] phone [phone] 04/08/19 1:31 PM

## 2019-04-08 NOTE — Therapy (Signed)
[facility] [address] [place], [place], [address] Phone: [phone]   Fax:  [phone]  Physical Therapy Treatment  Patient Details  Name: Jesse Bowers MRN: [id number] Date of Birth: Jul 13, 1965 Referring Provider (PT): Clerance Lav, MD   Encounter Date: 04/08/2019  PT End of Session - 04/08/19 1427    Visit Number  8    Number of Visits  13    Date for PT Re-Evaluation  05/08/19    Authorization Type  [employer] primary -September 2020-Feb 28th 2021, [facility] secondary (however may lose part B) - 30 visit limit combined PT/OT (Same day counts as 1 visit)    Authorization - Visit Number  11   6 PT, 3 from OT   Authorization - Number of Visits  30    PT Start Time  1324   pt needing to use restroom at beginning of session   PT Stop Time  1402    PT Time Calculation (min)  38 min    Equipment Utilized During Treatment  Gait belt    Activity Tolerance  Patient tolerated treatment well    Behavior During Therapy  [facility] for tasks assessed/performed   verbose, needs re-direction at times      Past Medical History:  Diagnosis Date  . Abnormal weight loss   . Anxiety   . Arthritis   . Cataract    OU  . Celiac disease   . Cervical neck pain with evidence of disc disease    patient has a cyst   . Chronic constipation   . Chronic diastolic heart failure ([place])    Pt. denies  . Chronic pain   . Degenerative disc disease at L5-S1 level    with stenosis  . DVT (deep venous thrombosis) (HCC)    Right upper arm, bilateral leg  . Eczema    inguinal, feet  . Elevated liver enzymes   . 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 Family history of adverse reaction to anesthesia    family has problems with anesthesia of nausea and vomiting   . 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 GERD (gastroesophageal reflux disease)    History of in 20's  . Gluten enteropathy   . H/O parotitis    right   . Hard of hearing   .  History of kidney stones   . History of retinal tear    Bilateral  . History of staph infection    required wound vac  . Hx-TIA (transient ischemic attack)    2015  . LVH (left ventricular hypertrophy) 12/15/2016   Mild, noted on ECHO  . MVP (mitral valve prolapse)   . NAFL (nonalcoholic fatty liver)   . Neck pain   . Neuromuscular disorder ([place])    bilateral neuropathy feet.  . Pneumonia 12/17/2010  . Polycythemia   . Polycythemia, secondary   . PONV (postoperative nausea and vomiting)   . Protein C deficiency ([place])    Dr. Anne Fu  . Psoriasis    16 X10 cm psoriatic rash on sole of left foot ; open and occ scant bleeding;   . psoriatic arthritis   . PTSD (post-traumatic stress disorder)   . Scaphoid fracture of wrist 09/23/2013  . Seizure ([place])    childhood, medication until age 57 then weanned completely off  . Sleep apnea    split night study last done by Dr. [name] 06/18/15 shows severe OSA, CSA, and hypersomnia, rec bipap  . Splenomegaly   .  Stenosis of ureteropelvic junction (UPJ)    left  . Stroke [facility])    CVA vs TIA in left cerebrum causing slight right sided weakness-Dr. [name] follows  . Syrinx of spinal cord ([place]) 01/06/2014   c spine on MRI  . Tachycardia    hx of   . Transfusion history    past history- none recent, after surgeries due to blood loss  . Wears glasses   . Wears hearing aid     Past Surgical History:  Procedure Laterality Date  . ABDOMINAL HYSTERECTOMY Bilateral 1994   TAH, BSO- tranverse incision at 54 yo  . ANKLE ARTHROSCOPY WITH RECONSTRUCTION Right 2007  . CHOLECYSTECTOMY     laparoscopic  . COLONOSCOPY     x3  . EYE SURGERY     Left eye 03/02/2018, right 02/15/2018  . HIP ARTHROSCOPY W/ LABRAL REPAIR Right 05/11/2013   acetabular labral tear 03/30/2013  . KNEE ARTHROPLASTY Right   . KNEE JOINT MANIPULATION Left    x3 under anesthesia  . KNEE SURGERY Bilateral 1984   Right ACL, left PCL repair  . LITHOTRIPSY  2005  . LIVER  BIOPSY  2013   normal results.  [name] MASTECTOMY Bilateral    prior to 2009  . MOUTH SURGERY    . NASAL SEPTUM SURGERY N/A 09/20/2015   by ENT Dr. [name]  . OVARIAN CYST SURGERY Left    size of grapefruit, was informed that she had shortened vagina  . SHOULDER SURGERY Bilateral    Right 08/15/2016, Left 11/15/2016  . THUMB ARTHROSCOPY Left   . THYROIDECTOMY, PARTIAL Left 2008  . TOTAL KNEE ARTHROPLASTY Right 08/23/2018   Procedure: TOTAL KNEE ARTHROPLASTY;  Surgeon: [name], MD;  Location: [facility];  Service: Orthopedics;  Laterality: Right;  34mn  . TOTAL KNEE REVISION Left 02/06/2016   Procedure: LEFT TOTAL KNEE REVISION;  Surgeon: FGaynelle Arabian MD;  Location: [facility];  Service: Orthopedics;  Laterality: Left;  . TOTAL KNEE REVISION Left 04/22/2017   Procedure: Left knee polyethylene revision;  Surgeon: AGaynelle Arabian MD;  Location: [facility];  Service: Orthopedics;  Laterality: Left;  . UPPER GI ENDOSCOPY  2003    There were no vitals filed for this visit.  Subjective Assessment - 04/08/19 1325    Subjective  No falls. Aquatic therapy is going well - was most tired from backwards walking and mini squats. Was sore after aquatic therapy.    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 PMH: Psoriatic arthritis, Polycythemia vera, CVA, s/p Rt TKR 08/23/18, Lt knee revision 04/2017, lumbar radiculitis, multiple scaphoid fx's Rt thumb, chronic pain    How long can you walk comfortably?  can walk around the house - furniture walking    Diagnostic tests  MRI cervical spine 07/2017: Unchanged C4 through C6 cervical spinal cord syrinx since the MRIin March 2016, and stable by report since October 2015., MRI brain 2019: No acute intracranial abnormality and stable noncontrast MRI appearance of the brain since 2017.    Patient Stated Goals  can't get up without using his hands, wants to be able to get out of his chair without using his hands - less pain in  legs and back, be stronger.    Currently in Pain?  Yes    Pain Score  3     Pain Location  Shoulder   R wrist   Pain Orientation  Right    Pain Descriptors / Indicators  Sore  Pain Onset  More than a month ago    Aggravating Factors   soreness from OT.    Multiple Pain Sites  Yes    Pain Score  5    Pain Location  Knee    Pain Orientation  Right;Left    Pain Descriptors / Indicators  Aching    Pain Type  Chronic pain         [facility] PT Assessment - 04/08/19 1335      Standardized Balance Assessment   Standardized Balance Assessment  Timed Up and Go Test      Timed Up and Go Test   Normal TUG (seconds)  29.38    TUG Comments  with RW                   [facility] Adult PT Treatment/Exercise - 04/08/19 1335      Transfers   Sit to Stand  5: Supervision;With upper extremity assist    Sit to Stand Details  Verbal cues for sequencing;Verbal cues for technique;Verbal cues for precautions/safety    Five time sit to stand comments   36.06 seconds with BUE support from mat table     Stand to Sit  5: Supervision;With upper extremity assist      Ambulation/Gait   Ambulation/Gait  Yes    Ambulation/Gait Assistance  4: Min guard;5: Supervision    Ambulation/Gait Assistance Details  for small clinic distances between activities today    Ambulation Distance (Feet)  30 Feet   x2   Assistive device  Rolling walker    Gait Pattern  Step-through pattern;Trunk flexed;Decreased dorsiflexion - left;Decreased dorsiflexion - right;Decreased hip/knee flexion - right;Decreased hip/knee flexion - left;Decreased stance time - left;Right foot flat;Left foot flat    Ambulation Surface  Level;Indoor      Neuro Re-ed    Neuro Re-ed Details   Forwards marching 3 reps in // bars with BUE support, Backwards walking 3 reps in // bars with BUE support and min guard, cues to also move hands back while ambulating backwards. Needed seated rest break afterwards due to fatigue. Modified SLS on 4" block,  also performing closed chain calf stretch in this position, holding position with BUE support aiming to float fingertips in // bars, 3 x 20 second reps B. pt's L> RLE fatigues more easily. Wide BOS no UE balance with intermittent fingertip support, min guard for safety, pt's legs start trembling approx. after 20 seconds - 3 x 30 seconds. Attempted 2 x 5 reps head turns with fingertip support with wide BOS, pt reporting incr dizziness after.       Exercises   Other Exercises   Calf stretch standing in RW with LLE on incline - unable to hold for >15 seconds, performed approx.. 8 reps. Seated calf stretch on rockerboard - 4 x 15 second reps. Pt reporting relief after performing.              PT Education - 04/08/19 1426    Education Details  continue with HEP    Person(s) Educated  Patient    Methods  Explanation    Comprehension  Verbalized understanding       PT Short Term Goals - 04/08/19 1433      PT SHORT TERM GOAL #1   Title  Pt will be independent with initial HEP for improved strength, balance, and gait. ALL STGS DUE 04/06/19    Time  4   due to delay in scheduling   Period  Weeks    Status  Achieved    Target Date  04/06/19      PT SHORT TERM GOAL #2   Title  Patient will undergo further assessment of TUG with use of RW vs. BERG  in order to determine fall risk - goal written as appropriate.    Baseline  performed TUG with RW    Time  4    Period  Weeks    Status  Achieved      PT SHORT TERM GOAL #3   Title  Patient will verbalize understanding of fall prevention strategies in the home.    Time  4    Period  Weeks    Status  Deferred      PT SHORT TERM GOAL #4   Title  Patient will ambulate at least 38' with RW with supervision in order to safely ambulate at home.    Baseline  pt has ambulated 115' in clinic with min guard    Time  4    Period  Weeks    Status  Partially Met      PT SHORT TERM GOAL #5   Title  Patient will perform 4 steps with supervision using  single railing and step to pattern in order to increase safety at home with stairs.    Baseline  not yet assessed.    Time  4    Period  Weeks    Status  Deferred      PT SHORT TERM GOAL #6   Title  Perform 5x sit to stand from standard arm chair vs. mat table and write goal as appropriate.    Baseline  met on 04/08/19    Time  4    Period  Weeks    Status  Achieved        PT Long Term Goals - 04/08/19 1434      PT LONG TERM GOAL #1   Title  Pt will be independent with final HEP for land/aquatic therapy for improved strength, ROM, balance, and gait. ALL LTGS DUE 05/04/19    Time  8   due to delay in scheduling   Period  Weeks    Status  New      PT LONG TERM GOAL #2   Title  Pt will decr TUG score with RW to 24 seconds or less with RW to decr fall risk.    Baseline  29.38 seconds with RW    Time  8    Period  Weeks    Status  New      PT LONG TERM GOAL #3   Title  Patient will ambulate at least 150' with RW with mod I in order to safely ambulate at home.    Baseline  not yet assessed.    Time  8    Period  Weeks    Status  Revised      PT LONG TERM GOAL #4   Title  Patient will perform 12 steps with supervision using single railing and step to pattern in order to increase safety at home with stairs.    Baseline  not yet assessed.    Time  8    Period  Weeks    Status  New      PT LONG TERM GOAL #5   Title  Patient will perform bed mobility, stap step transfers with RW with mod I in order to decrease caregiver burden.    Baseline  not  yet assessed.    Time  8    Period  Weeks    Status  New      PT LONG TERM GOAL #6   Title  Pt will decr 5x sit <> stand time from 36 seconds to 30 seconds or less from mat table using BUE support to demo improved functional LE strength.    Time  8    Period  Weeks    Status  New            Plan - 04/08/19 1430    Clinical Impression Statement  Assessed STGs today. Performed the TUG today with RW, pt performing in 29.38  seconds indicating that pt is a high risk for falls. Performed 5x sit <> stand from mat table with BUE support in 36.06 seconds, indicating decr functional LE strength. LTGs written as appropriate. Stair STG deferred at this time.   Pt fatigued easily with standing balance activities in // bars - needed seated and standing rest breaks. Has difficulty with static standing balance with wide BOS and no UE support, reporting mild dizziness when attempting head turns R + L.    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 PMH: Psoriatic arthritis, Polycythemia vera, CVA, s/p Rt TKR 08/23/18, Lt knee revision 04/2017, lumbar radiculitis, multiple scaphoid fx's Rt thumb, chronic pain    Examination-Activity Limitations  Transfers;Stairs;Locomotion Level;Stand    Examination-Participation Restrictions  Community Activity    Stability/Clinical Decision Making  Evolving/Moderate complexity    Rehab Potential  Good    PT Frequency  2x / week    PT Duration  6 weeks    PT Treatment/Interventions  ADLs/Self Care Home Management;Neuromuscular re-education;Therapeutic activities;Therapeutic exercise;Patient/family education;Manual techniques;Scar mobilization;Passive range of motion;Manual lymph drainage;Electrical Stimulation;Aquatic Therapy;Energy conservation;Gait training;Stair training;Functional mobility training;DME Instruction;Balance training;Orthotic Fit/Training    PT Next Visit Plan  continue LE strengthening, gait training (walking around obstacles), assess stairs. standing balance in // bars, fall prevention strategy handout.    PT Home Exercise Plan  [id number]    Consulted and Agree with Plan of Care  Patient       Patient will benefit from skilled therapeutic intervention in order to improve the following deficits and impairments:  Decreased range of motion, Decreased endurance, Decreased activity tolerance,  Abnormal gait, Difficulty walking, Decreased coordination, Decreased balance, Decreased strength, Pain, Decreased mobility, Decreased safety awareness  Visit Diagnosis: Other symptoms and signs involving the musculoskeletal system  Other symptoms and signs involving the nervous system  Other abnormalities of gait and mobility  Muscle weakness (generalized)     Problem List Patient Active Problem List   Diagnosis Date Noted  . Osteoarthritis of right knee 08/23/2018  . Constipation due to opioid therapy 03/30/2018  . Retinopathy of both eyes 01/06/2018  . SNHL (sensorineural hearing loss) 12/04/2017  . Abnormal urinary stream 12/03/2017  . Osteoarthritis of carpometacarpal (CMC) joint of thumb 11/30/2017  . Muscle weakness 11/17/2017  . Transient vision disturbance 11/12/2017  . Bilateral hand pain 10/30/2017  . Pain of left hip joint 10/09/2017  . Gynecomastia 07/10/2017  . Iron deficiency anemia 07/05/2017  . Spasticity 05/20/2017  . Lumbar radiculitis 04/20/2017  . Ulnar 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, left 11/28/2016  . Idiopathic peripheral neuropathy 11/28/2016  . 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 Long term (current) use of anticoagulants 08/23/2015  . Right upper quadrant abdominal pain 08/23/2015  . Memory loss  05/10/2015  . Gait abnormality 04/07/2015  . Alkaline phosphatase elevation 04/07/2015  . History of thrombosis 03/26/2015  . Medial epicondylitis 02/07/2015  . Cognitive decline 12/21/2014  . Leukopenia 12/05/2014  . Rotator cuff syndrome of right shoulder 10/27/2014  . Status post left knee replacement 08/22/2014  . Left lateral epicondylitis 08/22/2014  . Chronic cerebral ischemia 08/18/2014  . Arthrofibrosis of knee joint 08/17/2014  . Cubital canal compression syndrome, left 08/17/2014  . 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 Chronic pain syndrome 04/10/2014  . Insomnia 04/10/2014  . Chronic non-specific white matter lesions on MRI  04/10/2014  . CFS (chronic fatigue syndrome) 04/10/2014  . 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 Biceps tendonitis on left 03/01/2014  . Polycythemia vera ([place]) 12/27/2013  . H/O TIA (transient ischemic attack) and stroke 12/27/2013  . Neck pain 12/27/2013  . OSA (obstructive sleep apnea) 12/08/2013  . 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 Depression with anxiety 08/04/2013  . Protein C deficiency ([place]) 08/01/2013  . Post traumatic stress disorder (PTSD) 08/01/2013  . Speech abnormality 07/25/2013  . Obesity 05/10/2013  . Lower extremity edema 05/10/2013  . GERD (gastroesophageal reflux disease) 05/10/2013  . Arthritis 05/10/2013  . OA (osteoarthritis) of knee 03/15/2013  . Headache 10/25/2012  . Palpitations 10/18/2012  . Fatty liver determined by biopsy 06/01/2012  . Arthropathic psoriasis, unspecified ([place]) 04/29/2012  . Abnormal liver enzymes 03/29/2012  . Psoriatic arthritis ([place]) 12/29/2011  . Left Renal Hydronephrosis 12/11/2010  . 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 Celiac disease 05/27/2010  . Thyroid nodule 05/27/2010  . Male-to-male transgender person 09/20/2002    Arliss Journey, PT, DPT  04/08/2019, 2:42 PM  [facility] [address] [place], [place], [address] Phone: [phone]   Fax:  [phone]  Name: Azul Brumett MRN: [id number] Date of Birth: May 23, 1965

## 2019-04-08 NOTE — Patient Instructions (Signed)
    Strengthening: Resisted Flexion   Attach tube to door.  Hold tubing with one arm at side. Pull forward and up with elbow straight. Move shoulder through pain-free range of motion, no further than shoulder height. Repeat 15-20 times per set.  Do 1-2 sessions per day.    Strengthening: Resisted Extension   Attach one end to door.  Hold tubing in one hand, arm forward. Pull arm back, elbow straight. Repeat 15-20 times per set. Do 1-2 sessions per day.   Resisted Horizontal Abduction: Bilateral   Sit or stand, tubing in both hands, palms down and arms out in front. Keeping arms straight, pinch shoulder blades together and stretch arms out. Repeat 15-20 times per set.  Do 1-2 sessions per day.   Scapular Retraction: Rowing (Eccentric) - Arms - 45 Degrees (Resistance Band)    Sit, Hold end of band in each hand. Pull back until elbows are even with trunk. Keep elbows at your sides, thumbs up. Slowly release for 3-5 seconds. 15-20 reps per set, 1-2 sets per day      Triceps Extension (Frontal)    One arm forward at chest height and bent to 90, end of band in hand, other end secured on same side shoulder by other hand, extend arm slowly. Hold 2seconds. Repeat 15-20 times, alternating arms. Do 1-2 sessions per day.   **REPEAT ALL WITH BOTH ARMS.

## 2019-04-11 ENCOUNTER — Ambulatory Visit
Admission: RE | Admit: 2019-04-11 | Discharge: 2019-04-11 | Disposition: A | Discharge status: Home / Self Care | Payer: BC Managed Care – PPO | Source: Ambulatory Visit | Attending: Family Medicine | Admitting: Family Medicine

## 2019-04-11 ENCOUNTER — Ambulatory Visit: Payer: BC Managed Care – PPO | Admitting: Physical Therapy

## 2019-04-11 ENCOUNTER — Encounter: Payer: Self-pay | Admitting: Hematology

## 2019-04-11 DIAGNOSIS — R222 Localized swelling, mass and lump, trunk: Secondary | ICD-10-CM

## 2019-04-12 ENCOUNTER — Encounter: Payer: Self-pay | Admitting: Neurology

## 2019-04-12 ENCOUNTER — Ambulatory Visit (INDEPENDENT_AMBULATORY_CARE_PROVIDER_SITE_OTHER): Payer: BC Managed Care – PPO | Admitting: Psychology

## 2019-04-12 ENCOUNTER — Ambulatory Visit: Payer: BC Managed Care – PPO | Admitting: Physical Therapy

## 2019-04-12 ENCOUNTER — Other Ambulatory Visit: Payer: Self-pay

## 2019-04-12 ENCOUNTER — Ambulatory Visit: Payer: BC Managed Care – PPO | Admitting: Neurology

## 2019-04-12 ENCOUNTER — Encounter: Payer: BC Managed Care – PPO | Admitting: Occupational Therapy

## 2019-04-12 VITALS — BP 132/88 | HR 92 | Temp 97.9°F | Ht 69.0 in | Wt 205.5 lb

## 2019-04-12 DIAGNOSIS — Z7901 Long term (current) use of anticoagulants: Secondary | ICD-10-CM

## 2019-04-12 DIAGNOSIS — G4731 Primary central sleep apnea: Secondary | ICD-10-CM | POA: Diagnosis not present

## 2019-04-12 DIAGNOSIS — R39198 Other difficulties with micturition: Secondary | ICD-10-CM | POA: Insufficient documentation

## 2019-04-12 DIAGNOSIS — F4323 Adjustment disorder with mixed anxiety and depressed mood: Secondary | ICD-10-CM | POA: Diagnosis not present

## 2019-04-12 DIAGNOSIS — D6859 Other primary thrombophilia: Secondary | ICD-10-CM

## 2019-04-12 DIAGNOSIS — G894 Chronic pain syndrome: Secondary | ICD-10-CM

## 2019-04-12 MED ORDER — ARMODAFINIL 200 MG PO TABS: ORAL_TABLET | ORAL | 5 refills | Status: DC

## 2019-04-12 NOTE — Progress Notes (Signed)
[facility]  PATIENT: Jesse Bowers DOB: 30-Oct-1965     HISTORICAL  CHIEF COMPLAINT:  Chief Complaint  Patient presents with  . Follow-up    RM 12, alone. Last seen 04/06/2018.  In electric WC in office today.  Has been dx with sensory hearing loss, intermittent blurry vision (has glasses but do not work). Had right knee replacement 08/2018-he is doing well since. He is doing PT/OT via Jesse Bowers with neurorehab and aqua therapy.     HISTORY OF PRESENT ILLNESS:   Update 04/12/2019: For the OSA, he is using a new type of facemask that is under the nose and over the mouth but not over the nose.    He uses it for a few hours but after using the bathroom often does not place it back on. This mask is better but he still has a dry mouth (despite Biotene and gum).  Due to reduced urinary frequency during the day Myrbetriq has been held.  PVR was not high.   Has had pelvic PT.   Besides Myrbetriq, we discussed Morphine and baclofen might affect urinary function.  BiP[employer] DME company is [employer].     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     Sees Jesse Bowers for lower back, hip and knee pain.    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      EPWORTH SLEEPINESS SCALE  On a scale of 0 - 3 what is the chance of dozing:  Sitting and Reading:   3 Watching TV:    3 Sitting inactive in a public place: 3 Passenger in car for one hour: 3 Lying down to rest in the afternoon: 3 Sitting and talking to someone: 0 Sitting quietly after lunch:  3 In a car, stopped in traffic:  0  Total (out of 24):  18/24 (moderately sleepy)   Update 04/06/2018: He is a 54 yo transgender male with complex sleep apnea.   He is on ASV mode EPAP 7 with PS 4-15.   AHI was good at 3.1 on recent download, reduced compliance at 43% days > 4 hours.   He reports using it nightly but even with humidifier he notes a dry mouth.    Sometimes he notes that he takes  the mask off in the middle of the night and then does not put it back on.   Biotene at night and lozenges but it has not helped much   He takes ice chips at night.    He has gained 20 pounds or so the last 2 years.    [name] is his PAP DME company.      He reports continued difficulty with cognition and memory loss.     Jesse Bowers evaluated (report 01/05/15 in EPIC was reviewed).  His summary reports decreased mental processing speed, verbal fluency, mental flexibility and bimanual motor speed .  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   He continues to see floaters.     He now has hearing aids.      I personally reviewed the 12/26/2017 MRI showing a stable pattern of scattered mostly subcortical small T2/FLAIR foci, most c/w chronic microvascular ischemic changes.    No large vessel strokes or lacunar strokes seen.     He has back and knee pain and sees Jesse Bowers (PMR).  He has had left TKR and he has been told he needs right TKR.    He is on Kadian 60 mg qd and oxycodone 10 mg bid.      Update 11/12/2017: He was having difficulties with visual changes and flashing lights in the visual field.  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   He reports a torn retina on the right.  Apparently, some ischemic changes were seen.   He reports his ophtometrist told him he was having mini-strokes or TIAs Jesse Bowers was the OD; Jesse Bowers).  He reports more difficulty with gait due to balance and knee orthopedic issues.  Also has numbness in the hands, right greater than left and the upper back.  He uses an Clinical research associate.  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   Previous brain MRIs have shown small round subcortical T2/FLAIR hypertense foci.  These are nonspecific and could be due to chronic microvascular ischemic changes.  There are no  lacunar or large vessel strokes.  MRI images were personally reviewed.  Additionally lab work and cardiology studies were also reviewed.  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  MRI brain shows some scattered subcortical white matter foci likely due to migraine or mild chronic microvascular ischemic change.   MRI lumbar 04/16/2016 shows DDD/DJD at L5-S1 with mild foraminal narrowing and small left L3-L4 disc protrusion.   Echo 12/15/2016 did not show any valvular problem or PFO/ASD, EF% was 60-65%.  He denies h/o smoking, HTN, DM.  He has OSA diagnosed in 2015 and has been on CPAP since 2017 and uses it every night.    He no longer has the chip.        From 10/02/2015:   Syrinx/numbness:   He reports tingling in his hands, left more than right.  Also has numbness in a acapelike distribution of the back.   Laying flat increases hand numbness.    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   He reports a lot of lot of numbness in the back and arms. Ortho has told him he has subluxation at the elbow affecting his left ulnar nerve.     He also states he has woken up with severe arm weakness some mornings.    He reports a lot of pain as well as the numbness, mostly in the back.  Memory/Psych:   He reports short term memory issues.       He notes a lot of difficulty focusing.   Jesse Bowers evaluated (report 01/05/15 in EPIC was reviewed).  His summary reports decreased mental processing speed, verbal fluency, mental flexibility and bimanual motor speed     He sees Jesse [facility]ers at [facility] for PTSD and anxiety.     Night time cyclobenzapine has helped his sleep and mood related issues.    OSA:  He has OSA with central apneas and is now on ASV mode (EPAP 7 cm, PS 4-15) and has trouble wearing the mask the entire night.  He was on BiPAP but still had OSA.   Mask does not leak too much and is better than prior mask.      I reviewed his download. It shows use most days though he did not use it 1 week during his sinus surgery.  Efficacy was excellent with an AHI equals 1.8.  Olfactory Hallucinations:   These are better, none recently.    He feels better  since sinus surgery  Chronic pain:   He sees Jesse. Ephriam Knuckles (PMR).   He has chronic pain helped a lot by a numbing ointment and Morphine.    He also reports his right foot turns in as he walks and PMR is setting him up for an AFO.    He is on Cosyntex for psoriatic arthritis.   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    His MRI brain 2015 showed mild predominantly subcortical hyperintense foci that are non-specific.  A relationship to the Prt C deficiency is possible.     EPWORTH SLEEPINESS SCALE  On a scale of 0 - 3 what is the chance of dozing:  Sitting and Reading:   2 Watching TV:    3 Sitting inactive in a public place: 0 Passenger in car for one hour: 3 Lying down to rest in the afternoon: 3 Sitting and talking to someone: 0 Sitting quietly after lunch:  1 In a car, stopped in traffic:  0  Total (out of 24):    12   (mild to moderate EDS)   REVIEW OF SYSTEMS:  Constitutional: Has fatigue.  See above.   No fevers, chills, sweats, or change in appetite Eyes: Reports some episodes of visual blurring.  There is no eye pain.  Ear, nose and throat: No hearing loss, ear pain, nasal congestion, sore throat Cardiovascular: No chest pain.   He notes occ. palpitations Respiratory:  No shortness of breath at rest or with exertion.   No wheezes GastrointestinaI: No nausea, vomiting, diarrhea, abdominal pain, fecal incontinence Genitourinary:  No dysuria, urinary retention or frequency.  No nocturia. Musculoskeletal:  He has psoriatic arthritis with joint pain/swelling.  On high-dose opiates Integumentary:has psoriasis Neurological: as above Psychiatric: History of depression, anxiety and PTSD.   Endocrine: He is transgender and on chronic testosterone therapy.   Reports heat  intolerance.  No palpitations, diaphoresis, change in appetite, change in weigh or increased thirst Hematologic/Lymphatic:  No anemia, purpura, petechiae. Allergic/Immunologic: No itchy/runny eyes, nasal congestion, recent allergic reactions, rashes  ALLERGIES: Allergies  Allergen Reactions  . Gabapentin Nausea Only and Other (See Comments)    Other reaction(s): nausea, mental status Drowsiness and restlessness. Pt. States, " It makes me crazy, I can't take this medicine."  . Penicillin G Anaphylaxis and Other (See Comments)    Has patient had a PCN reaction causing immediate rash, facial/tongue/throat swelling, SOB or lightheadedness with hypotension: Yes Has patient had a PCN reaction causing severe rash involving mucus membranes or skin necrosis: No Has patient had a PCN reaction that required hospitalization Yes Has patient had a PCN reaction occurring within the last 10 years: No If all of the above answers are "NO", then may proceed with Cephalosporin use.   . Sulfa Antibiotics Rash    Stevens-Johnson rash  . Tegaderm Chg Dressing  [Chlorhexidine] Swelling and Itching  . Vancomycin Rash and Other (See Comments)    RED MAN SYNDROME CAN HAVE IF GIVEN OVER 2HOURS  . Duloxetine Other (See Comments)    Restless legs  . Tegaderm Ag Mesh [Silver] Dermatitis    Causes blistering wounds   . Citalopram Other (See Comments)    Dystonia  . Wheat Bran     Due to Celiac  . Ibuprofen Other (See Comments)    Contraindicated with Xarelto.   . Nortriptyline Other (See Comments)  Dry mouth at 25 mg dose.  Tolerates 10 mg dose  . Pregabalin Other (See Comments)    Ineffective  . Sulfacetamide Sodium-Sulfur Rash    HOME MEDICATIONS: Outpatient Medications Prior to Visit  Medication Sig Dispense Refill  . acyclovir ointment (ZOVIRAX) 5 % Apply 1 application topically daily as needed (cold sores).   0  . antiseptic oral rinse (BIOTENE) LIQD 15 mLs by Mouth Rinse route as needed for  dry mouth.    [name] augmented betamethasone dipropionate (DIPROLENE-AF) 0.05 % ointment Apply 1 application topically 2 (two) times daily.    . baclofen (LIORESAL) 20 MG tablet Take 1 tablet (20 mg total) by mouth 4 (four) times daily. 120 tablet 3  . Calcipotriene-Betameth Diprop (ENSTILAR) 0.005-0.064 % FOAM Apply 1 application topically 2 (two) times daily as needed.     . clobetasol (TEMOVATE) 0.05 % GEL Apply 1 application topically 2 (two) times daily as needed (itching).    . clorazepate (TRANXENE) 15 MG tablet Take 15 mg by mouth at bedtime.    . clorazepate (TRANXENE) 7.5 MG tablet Take 7.5 mg by mouth 2 (two) times daily.     [name] desonide (DESOWEN) 0.05 % ointment Apply 1 application topically 2 (two) times daily as needed (psoriasis).   2  . diclofenac sodium (VOLTAREN) 1 % GEL APPLY TOPICALLY TO BOTH HANDS 3 TIMES DAILY. (Patient taking differently: Apply 1 application topically 3 (three) times daily as needed. ) 300 g 4  . Emollient (CERAVE) CREA Apply 1 application topically 2 (two) times daily as needed.     . Fluocinolone Acetonide 0.01 % OIL Place 3 drops into both ears 2 (two) times daily as needed for itching.    . furosemide (LASIX) 20 MG tablet Take 1 tablet (20 mg total) by mouth daily. To be taken with the 40 mg to equal 60 mg daily 30 tablet 5  . furosemide (LASIX) 40 MG tablet Take 1 tablet (40 mg total) by mouth daily. To be taken with the 20 mg to equal 60 mg daily. 30 tablet 5  . mirabegron ER (MYRBETRIQ) 50 MG TB24 tablet Take 1 tablet (50 mg total) by mouth daily. 30 tablet 11  . morphine (KADIAN) 60 MG 24 hr capsule Take 1 capsule (60 mg total) by mouth every 12 (twelve) hours. 60 capsule 0  . NEEDLE, DISP, 18 G 18G X 1-1/2" MISC 1 Units by Does not apply route once a week. 50 each 2  . NEEDLE, DISP, 23 G 23G X 3/4" MISC 1 Units by Does not apply route once a week. 100 each 1  . neomycin-polymyxin-hydrocortisone (CORTISPORIN) 3.5-10000-1 OTIC suspension Place 4 drops into  both ears 2 (two) times daily as needed (ear pain).   1  . Oxycodone HCl 10 MG TABS Take 1 tablet (10 mg total) by mouth every 8 (eight) hours as needed. 90 tablet 0  . potassium chloride (KLOR-CON) 10 MEQ tablet TAKE K-DUR 2 TABLETS 10 MEQ DAILY 60 tablet 11  . Propylene Glycol (SYSTANE COMPLETE) 0.6 % SOLN Place 1-2 drops into both eyes 2 (two) times daily as needed (dry eyes).     . rivaroxaban (XARELTO) 20 MG TABS tablet Take 1 tablet (20 mg total) by mouth at bedtime. TAKE 1 TABLET DAILY BEFORE BEDTIME 90 tablet 3  . Secukinumab (COSENTYX SENSOREADY PEN) 150 MG/ML SOAJ Inject 300 mg into the skin every 28 (twenty-eight) days.     . Syringe, Disposable, 1 ML MISC 1 Units by Does not  apply route once a week. 60 each 2  . testosterone cypionate (DEPOTESTOSTERONE CYPIONATE) 200 MG/ML injection INJECT 0.5 MLS (100 MG TOTAL) INTO THE MUSCLE ONCE A WEEK. 10 mL 0  . ursodiol (ACTIGALL) 250 MG tablet Take 250 mg by mouth 2 (two) times daily with a meal.     . ursodiol (ACTIGALL) 500 MG tablet Take 500 mg by mouth 2 (two) times daily with a meal.     . venlafaxine XR (EFFEXOR-XR) 150 MG 24 hr capsule Take 2 capsules (300 mg total) by mouth daily with breakfast. 60 capsule 5  . prazosin (MINIPRESS) 2 MG capsule Take 4 mg by mouth at bedtime.      No facility-administered medications prior to visit.    PAST MEDICAL HISTORY: Past Medical History:  Diagnosis Date  . Abnormal weight loss   . Anxiety   . Arthritis   . Cataract    OU  . Celiac disease   . Cervical neck pain with evidence of disc disease    patient has a cyst   . Chronic constipation   . Chronic diastolic heart failure ([place])    Pt. denies  . Chronic pain   . Degenerative disc disease at L5-S1 level    with stenosis  . DVT (deep venous thrombosis) ([facility])    Right upper arm, bilateral leg  . Eczema    inguinal, feet  . Elevated liver enzymes   . 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 Family history of adverse  reaction to anesthesia    family has problems with anesthesia of nausea and vomiting   . 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 GERD (gastroesophageal reflux disease)    History of in 20's  . Gluten enteropathy   . H/O parotitis    right   . Hard of hearing   . History of kidney stones   . History of retinal tear    Bilateral  . History of staph infection    required wound vac  . Hx-TIA (transient ischemic attack)    2015  . LVH (left ventricular hypertrophy) 12/15/2016   Mild, noted on ECHO  . MVP (mitral valve prolapse)   . NAFL (nonalcoholic fatty liver)   . Neck pain   . Neuromuscular disorder ([place])    bilateral neuropathy feet.  . Pneumonia 12/17/2010  . Polycythemia   . Polycythemia, secondary   . PONV (postoperative nausea and vomiting)   . Protein C deficiency ([place])    Jesse. Anne Fu  . Psoriasis    16 X10 cm psoriatic rash on sole of left foot ; open and occ scant bleeding;   . psoriatic arthritis   . PTSD (post-traumatic stress disorder)   . Scaphoid fracture of wrist 09/23/2013  . Seizure ([place])    childhood, medication until age 21 then weanned completely off  . Sleep apnea    split night study last done by Jesse. Felecia Bowers 06/18/15 shows severe OSA, CSA, and hypersomnia, rec bipap  . Splenomegaly   . Stenosis of ureteropelvic junction (UPJ)    left  . Stroke [facility])    CVA vs TIA in left cerebrum causing slight right sided weakness-Jesse. Felecia Bowers follows  . Syrinx of spinal cord ([place]) 01/06/2014   c spine on MRI  . Tachycardia    hx of   . Transfusion history    past history- none recent, after surgeries due to blood loss  . Wears glasses   . Wears hearing aid  PAST SURGICAL HISTORY: Past Surgical History:  Procedure Laterality Date  . ABDOMINAL HYSTERECTOMY Bilateral 1994   TAH, BSO- tranverse incision at 54 yo  . ANKLE ARTHROSCOPY WITH RECONSTRUCTION Right 2007  . CHOLECYSTECTOMY     laparoscopic  . COLONOSCOPY     x3  . EYE SURGERY     Left eye  03/02/2018, right 02/15/2018  . HIP ARTHROSCOPY W/ LABRAL REPAIR Right 05/11/2013   acetabular labral tear 03/30/2013  . KNEE ARTHROPLASTY Right   . KNEE JOINT MANIPULATION Left    x3 under anesthesia  . KNEE SURGERY Bilateral 1984   Right ACL, left PCL repair  . LITHOTRIPSY  2005  . LIVER BIOPSY  2013   normal results.  [name] MASTECTOMY Bilateral    prior to 2009  . MOUTH SURGERY    . NASAL SEPTUM SURGERY N/A 09/20/2015   by ENT Jesse. [name]  . OVARIAN CYST SURGERY Left    size of grapefruit, was informed that she had shortened vagina  . SHOULDER SURGERY Bilateral    Right 08/15/2016, Left 11/15/2016  . THUMB ARTHROSCOPY Left   . THYROIDECTOMY, PARTIAL Left 2008  . TOTAL KNEE ARTHROPLASTY Right 08/23/2018   Procedure: TOTAL KNEE ARTHROPLASTY;  Surgeon: [name], MD;  Location: [facility];  Service: Orthopedics;  Laterality: Right;  47mn  . TOTAL KNEE REVISION Left 02/06/2016   Procedure: LEFT TOTAL KNEE REVISION;  Surgeon: FGaynelle Arabian MD;  Location: [facility];  Service: Orthopedics;  Laterality: Left;  . TOTAL KNEE REVISION Left 04/22/2017   Procedure: Left knee polyethylene revision;  Surgeon: AGaynelle Arabian MD;  Location: [facility];  Service: Orthopedics;  Laterality: Left;  . UPPER GI ENDOSCOPY  2003    FAMILY HISTORY: Family History  Problem Relation Age of Onset  . Stroke Maternal Grandfather        554 . Heart attack Maternal Grandfather   . Glaucoma Maternal Grandfather   . Macular degeneration Maternal Grandfather   . Hypertension Mother   . Psoriasis Mother   . Other Mother        meningioma developed ~2019  . Glaucoma Mother   . Cancer Paternal Grandfather   . Heart attack Paternal Grandfather   . Stroke Paternal Uncle        age 882 . Polycythemia Paternal Uncle   . Stroke Maternal Grandmother   . Congestive Heart Failure Maternal Grandmother   . Heart attack Maternal Grandmother   . Protein C deficiency Sister 348      Miscarriages    SOCIAL  HISTORY:  Social History   Socioeconomic History  . Marital status: Married    Spouse name: Not on file  . Number of children: 2  . Years of education: 4y college  . Highest education level: Not on file  Occupational History  . Occupation: Pediatric Nurse practitioner    Comment[place]  Tobacco Use  . Smoking status: Never Smoker  . Smokeless tobacco: Never Used  Substance and Sexual Activity  . Alcohol use: Yes    Comment: social  . Drug use: No  . Sexual activity: Yes    Birth control/protection: None    Comment: patient is a transgender on testosterone shots, no biological kids  Other Topics Concern  . 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 Patient was in [place] last 4 years and prior to that in [place]. His wife is working as Scientist, research (physical sciences) for [employer]. Patient is not working and applying for disability. They have 2 kids but no biologic children.    Social Determinants of Health   Financial Resource Strain:   . Difficulty of Paying Living Expenses: Not on file  Food Insecurity:   . Worried About Charity fundraiser in the Last Year: Not on file  . Ran Out of Food in the Last Year: Not on file  Transportation Needs:   . Lack of Transportation (Medical): Not on file  . Lack of Transportation (Non-Medical): Not on file  Physical Activity:   . Days of Exercise per Week: Not on file  . Minutes of Exercise per Session: Not on file  Stress:   . Feeling of Stress : Not on file  Social Connections:   . Frequency of Communication with Friends and Family: Not on file  . Frequency of Social Gatherings with Friends and Family: Not on file  . Attends Religious Services: Not on file  . Active Member of Clubs or Organizations: Not on file  . Attends Theatre manager Meetings: Not on file  . Marital Status: Not on file  Intimate Partner Violence:   . Fear of Current or Ex-Partner: Not on file  . Emotionally Abused: Not on file  . Physically Abused: Not on file  . Sexually Abused: Not on file     PHYSICAL EXAM  Vitals:   04/12/19 1257  BP: 132/88  Pulse: 92  Temp: 97.9 F (36.6 C)  Weight: 205 lb 8 oz (93.2 kg)  Height: 5' 9"  (1.753 m)    Body mass index is 30.35 kg/m.   General: The patient is well-developed and well-nourished and in no acute distress  HEENT: Head is normocephalic and atraumatic.      Skin: Extremities are without significant edema or rash.      Neurologic Exam  Mental status: He appears alert and oriented.[name]   Speech is normal.  Cranial nerves: Extraocular movements are full.  Facial strength is normal..He reports decreased right facial sensation.     Trapezius and sternocleidomastoid strength is normal. No dysarthria is noted.    Motor:  Muscle bulk and tone are normal. Strength is  5 / 5 in all 4 extremities.   Sensory: He reports reduced sensation to touch in the right arm relative to the left.    Coordination: Cerebellar testing reveals good finger-nose-finger bilaterally.  Gait and station: Station is normal.   Gait is wide.  Tandem gait is poor.  Romberg is positive.  Reflexes: Deep tendon reflexes are symmetric and normal bilaterally.    DIAGNOSTIC DATA (LABS, IMAGING, TESTING) - I reviewed patient records, labs, notes, testing and imaging myself where available.  Lab Results  Component Value Date   WBC 4.7 03/11/2019   HGB 13.6 03/11/2019   HCT 44.8 03/11/2019   MCV 78.7 (L) 03/11/2019   PLT 232 03/11/2019    Lab Results  Component Value Date   VITAMINB12 250 04/29/2018   Lab Results  Component Value Date   TSH 1.370 04/29/2018        ASSESSMENT AND PLAN  Complex sleep apnea syndrome  Protein C deficiency ([place])  Long term (current) use of  anticoagulants  Chronic pain syndrome  Urinary dysfunction  1.   He will continue BiPAP/ASV mode for complex OSA.  Continue with current style mask.   2.   We discussed that the urinary dysfunction could be worse due to some of the medications including MS Contin and baclofen.  Advised to discuss further with pain management to consider a reduction in the baclofen. 3.   He will continue Xarelto for protein S deficiency..   4.   He will return to see me in 12 months or sooner if new or worsening neurologic symptoms.   Tyke Outman A. [name], MD, PhD 06/10/8262, 1:58 PM Certified in Neurology, [place] [facility], Sleep Medicine, Pain Medicine and Neuroimaging  [facility] [address], [place],  [address] [phone]

## 2019-04-13 ENCOUNTER — Ambulatory Visit: Payer: BC Managed Care – PPO | Admitting: Physical Therapy

## 2019-04-13 ENCOUNTER — Other Ambulatory Visit: Payer: Self-pay

## 2019-04-13 ENCOUNTER — Ambulatory Visit: Payer: BC Managed Care – PPO | Admitting: Occupational Therapy

## 2019-04-13 DIAGNOSIS — R2689 Other abnormalities of gait and mobility: Secondary | ICD-10-CM | POA: Diagnosis not present

## 2019-04-13 DIAGNOSIS — R29898 Other symptoms and signs involving the musculoskeletal system: Secondary | ICD-10-CM

## 2019-04-13 DIAGNOSIS — R29818 Other symptoms and signs involving the nervous system: Secondary | ICD-10-CM

## 2019-04-13 DIAGNOSIS — M6281 Muscle weakness (generalized): Secondary | ICD-10-CM

## 2019-04-13 DIAGNOSIS — R278 Other lack of coordination: Secondary | ICD-10-CM

## 2019-04-13 DIAGNOSIS — R2681 Unsteadiness on feet: Secondary | ICD-10-CM

## 2019-04-13 NOTE — Therapy (Signed)
[facility] [address] [place], [place], [address] Phone: [phone]   Fax:  [phone]  Physical Therapy Treatment  Patient Details  Name: Jesse Bowers MRN: [id number] Date of Birth: [birth year] Referring Provider (PT): Clerance Lav, MD   Encounter Date: 04/13/2019  PT End of Session - 04/13/19 1455    Visit Number  9    Number of Visits  13    Date for PT Re-Evaluation  05/08/19    Authorization Type  [employer] primary -September 2020-Feb 28th 2021, [facility] secondary (however may lose part B) - 30 visit limit combined PT/OT (Same day counts as 1 visit)    Authorization - Visit Number  12    Authorization - Number of Visits  30    PT Start Time  [phone]   pt arrived late   PT Stop Time  0844    PT Time Calculation (min)  38 min    Equipment Utilized During Treatment  Gait belt    Activity Tolerance  Patient tolerated treatment well    Behavior During Therapy  [facility] for tasks assessed/performed   verbose, needs re-direction at times      Past Medical History:  Diagnosis Date  . Abnormal weight loss   . Anxiety   . Arthritis   . Cataract    OU  . Celiac disease   . Cervical neck pain with evidence of disc disease    patient has a cyst   . Chronic constipation   . Chronic diastolic heart failure ([place])    Pt. denies  . Chronic pain   . Degenerative disc disease at L5-S1 level    with stenosis  . DVT (deep venous thrombosis) (HCC)    Right upper arm, bilateral leg  . Eczema    inguinal, feet  . Elevated liver enzymes   . 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 Family history of adverse reaction to anesthesia    family has problems with anesthesia of nausea and vomiting   . 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 GERD (gastroesophageal reflux disease)    History of in 20's  . Gluten enteropathy   . H/O parotitis    right   . Hard of hearing   . History of kidney stones   . History of retinal tear     Bilateral  . History of staph infection    required wound vac  . Hx-TIA (transient ischemic attack)    2015  . LVH (left ventricular hypertrophy) 12/15/2016   Mild, noted on ECHO  . MVP (mitral valve prolapse)   . NAFL (nonalcoholic fatty liver)   . Neck pain   . Neuromuscular disorder ([place])    bilateral neuropathy feet.  . Pneumonia 12/17/2010  . Polycythemia   . Polycythemia, secondary   . PONV (postoperative nausea and vomiting)   . Protein C deficiency ([place])    Dr. Anne Fu  . Psoriasis    16 X10 cm psoriatic rash on sole of left foot ; open and occ scant bleeding;   . psoriatic arthritis   . PTSD (post-traumatic stress disorder)   . Scaphoid fracture of wrist 09/23/2013  . Seizure ([place])    childhood, medication until age 54 then weanned completely off  . Sleep apnea    split night study last done by Dr. [name] 06/18/15 shows severe OSA, CSA, and hypersomnia, rec bipap  . Splenomegaly   . Stenosis of ureteropelvic junction (UPJ)    left  .  Stroke [facility])    CVA vs TIA in left cerebrum causing slight right sided weakness-Dr. [name] follows  . 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 Tachycardia    hx of   . Transfusion history    past history- none recent, after surgeries due to blood loss  . Wears glasses   . Wears hearing aid     Past Surgical History:  Procedure Laterality Date  . ABDOMINAL HYSTERECTOMY Bilateral 1994   TAH, BSO- tranverse incision at 54 yo  . ANKLE ARTHROSCOPY WITH RECONSTRUCTION Right 2007  . CHOLECYSTECTOMY     laparoscopic  . COLONOSCOPY     x3  . EYE SURGERY     Left eye 03/02/2018, right 02/15/2018  . HIP ARTHROSCOPY W/ LABRAL REPAIR Right 05/11/2013   acetabular labral tear 03/30/2013  . KNEE ARTHROPLASTY Right   . KNEE JOINT MANIPULATION Left    x3 under anesthesia  . KNEE SURGERY Bilateral 1984   Right ACL, left PCL repair  . LITHOTRIPSY  2005  . LIVER BIOPSY  2013   normal results.  [name] MASTECTOMY  Bilateral    prior to 2009  . MOUTH SURGERY    . NASAL SEPTUM SURGERY N/A 09/20/2015   by ENT Dr. [name]  . OVARIAN CYST SURGERY Left    size of grapefruit, was informed that she had shortened vagina  . SHOULDER SURGERY Bilateral    Right 08/15/2016, Left 11/15/2016  . THUMB ARTHROSCOPY Left   . THYROIDECTOMY, PARTIAL Left 2008  . TOTAL KNEE ARTHROPLASTY Right 08/23/2018   Procedure: TOTAL KNEE ARTHROPLASTY;  Surgeon: [name], MD;  Location: [facility];  Service: Orthopedics;  Laterality: Right;  91mn  . TOTAL KNEE REVISION Left 02/06/2016   Procedure: LEFT TOTAL KNEE REVISION;  Surgeon: FGaynelle Arabian MD;  Location: [facility];  Service: Orthopedics;  Laterality: Left;  . TOTAL KNEE REVISION Left 04/22/2017   Procedure: Left knee polyethylene revision;  Surgeon: AGaynelle Arabian MD;  Location: [facility];  Service: Orthopedics;  Laterality: Left;  . UPPER GI ENDOSCOPY  2003    There were no vitals filed for this visit.  Subjective Assessment - 04/13/19 0815    Subjective  No falls. States that his right hamstring is still pretty tight. has a spot right below his rib cage that feels like a 9/10, feels sore, and feels like it has been getting worse rates a 9/10.    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 PMH: Psoriatic arthritis, Polycythemia vera, CVA, s/p Rt TKR 08/23/18, Lt knee revision 04/2017, lumbar radiculitis, multiple scaphoid fx's Rt thumb, chronic pain    How long can you walk comfortably?  can walk around the house - furniture walking    Diagnostic tests  MRI cervical spine 07/2017: Unchanged C4 through C6 cervical spinal cord syrinx since the MRIin March 2016, and stable by report since October 2015., MRI brain 2019: No acute intracranial abnormality and stable noncontrast MRI appearance of the brain since 2017.    Patient Stated Goals  can't get up without using his hands, wants to be able to get out of his chair without using his hands -  less pain in legs and back, be stronger.    Currently in Pain?  Yes    Pain Score  5     Pain Location  Back    Pain Orientation  Lower    Pain Descriptors / Indicators  Sore  Pain Type  Chronic pain    Pain Onset  More than a month ago                       [facility] Adult PT Treatment/Exercise - 04/13/19 1500      Ambulation/Gait   Ambulation/Gait  Yes    Ambulation/Gait Assistance  4: Min guard    Ambulation/Gait Assistance Details  cues to relax arms during gait    Ambulation Distance (Feet)  60 Feet   x2    Assistive device  Rolling walker    Gait Pattern  Step-through pattern;Trunk flexed;Decreased dorsiflexion - left;Decreased dorsiflexion - right;Decreased hip/knee flexion - right;Decreased hip/knee flexion - left;Decreased stance time - left;Right foot flat;Left foot flat    Ambulation Surface  Level;Indoor    Stairs  Yes    Stairs Assistance  4: Min guard;5: Supervision    Stair Management Technique  Two rails;Step to pattern;Forwards    Number of Stairs  8    Height of Stairs  6      Exercises   Exercises  Other Exercises    Other Exercises   .  Pt reporting decr stiffness after performing. Hamstring stretch on 8" box 2 x 30 seconds B, cues for proper technique, verbally added to HEP for pt to perform on a step at home. Pt reporting increased pain/soreness at R mid back, tried seated in w/c with tennis ball for self mobilization, pt reporting it made his pain worse and it was too much pressure. Trialed a noodle instead with pt reporting a decr in pain and felt better pressure wise. Educated pt to try at home with noodle (pt states that he has one at home that he can use).       Knee/Hip Exercises: Aerobic   Nustep  NuStep at gear 3 with BLE and BUE for 7 minutes for ROM, endurance, and strengthening.               PT Education - 04/13/19 1500    Education Details  seated hamstring stretch addition for home    Person(s) Educated  Patient    Methods   Explanation;Demonstration    Comprehension  Verbalized understanding;Returned demonstration       PT Short Term Goals - 04/08/19 1433      PT SHORT TERM GOAL #1   Title  Pt will be independent with initial HEP for improved strength, balance, and gait. ALL STGS DUE 04/06/19    Time  4   due to delay in scheduling   Period  Weeks    Status  Achieved    Target Date  04/06/19      PT SHORT TERM GOAL #2   Title  Patient will undergo further assessment of TUG with use of RW vs. BERG  in order to determine fall risk - goal written as appropriate.    Baseline  performed TUG with RW    Time  4    Period  Weeks    Status  Achieved      PT SHORT TERM GOAL #3   Title  Patient will verbalize understanding of fall prevention strategies in the home.    Time  4    Period  Weeks    Status  Deferred      PT SHORT TERM GOAL #4   Title  Patient will ambulate at least 60' with RW with supervision in order to safely ambulate at home.  Baseline  pt has ambulated 115' in clinic with min guard    Time  4    Period  Weeks    Status  Partially Met      PT SHORT TERM GOAL #5   Title  Patient will perform 4 steps with supervision using single railing and step to pattern in order to increase safety at home with stairs.    Baseline  not yet assessed.    Time  4    Period  Weeks    Status  Deferred      PT SHORT TERM GOAL #6   Title  Perform 5x sit to stand from standard arm chair vs. mat table and write goal as appropriate.    Baseline  met on 04/08/19    Time  4    Period  Weeks    Status  Achieved        PT Long Term Goals - 04/08/19 1434      PT LONG TERM GOAL #1   Title  Pt will be independent with final HEP for land/aquatic therapy for improved strength, ROM, balance, and gait. ALL LTGS DUE 05/04/19    Time  8   due to delay in scheduling   Period  Weeks    Status  New      PT LONG TERM GOAL #2   Title  Pt will decr TUG score with RW to 24 seconds or less with RW to decr fall risk.     Baseline  29.38 seconds with RW    Time  8    Period  Weeks    Status  New      PT LONG TERM GOAL #3   Title  Patient will ambulate at least 150' with RW with mod I in order to safely ambulate at home.    Baseline  not yet assessed.    Time  8    Period  Weeks    Status  Revised      PT LONG TERM GOAL #4   Title  Patient will perform 12 steps with supervision using single railing and step to pattern in order to increase safety at home with stairs.    Baseline  not yet assessed.    Time  8    Period  Weeks    Status  New      PT LONG TERM GOAL #5   Title  Patient will perform bed mobility, stap step transfers with RW with mod I in order to decrease caregiver burden.    Baseline  not yet assessed.    Time  8    Period  Weeks    Status  New      PT LONG TERM GOAL #6   Title  Pt will decr 5x sit <> stand time from 36 seconds to 30 seconds or less from mat table using BUE support to demo improved functional LE strength.    Time  8    Period  Weeks    Status  New            Plan - 04/13/19 1456    Clinical Impression Statement  Pt reporting incr pain of R thoracic paraspinal musculature - trialed self mobilization/massage with tennis ball, pt reporting too much pressure, trialed instead with a pool noodle with pt reporting relief and he can perform at home. Pt reporting improved ROM and decr stiffness after performing the NuStep. Will continue to progress towards LTGs.  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 PMH: Psoriatic arthritis, Polycythemia vera, CVA, s/p Rt TKR 08/23/18, Lt knee revision 04/2017, lumbar radiculitis, multiple scaphoid fx's Rt thumb, chronic pain    Examination-Activity Limitations  Transfers;Stairs;Locomotion Level;Stand    Examination-Participation Restrictions  Community Activity    Stability/Clinical Decision Making  Evolving/Moderate complexity     Rehab Potential  Good    PT Frequency  2x / week    PT Duration  6 weeks    PT Treatment/Interventions  ADLs/Self Care Home Management;Neuromuscular re-education;Therapeutic activities;Therapeutic exercise;Patient/family education;Manual techniques;Scar mobilization;Passive range of motion;Manual lymph drainage;Electrical Stimulation;Aquatic Therapy;Energy conservation;Gait training;Stair training;Functional mobility training;DME Instruction;Balance training;Orthotic Fit/Training    PT Next Visit Plan  continue LE strengthening, gait training (walking around obstacles),  standing balance in // bars, fall prevention strategy handout. Nustep for strengthening/endurance    PT Home Exercise Plan  [id number]    Consulted and Agree with Plan of Care  Patient       Patient will benefit from skilled therapeutic intervention in order to improve the following deficits and impairments:  Decreased range of motion, Decreased endurance, Decreased activity tolerance, Abnormal gait, Difficulty walking, Decreased coordination, Decreased balance, Decreased strength, Pain, Decreased mobility, Decreased safety awareness  Visit Diagnosis: Other symptoms and signs involving the musculoskeletal system  Other symptoms and signs involving the nervous system  Muscle weakness (generalized)  Other lack of coordination     Problem List Patient Active Problem List   Diagnosis Date Noted  . Urinary dysfunction 04/12/2019  . Osteoarthritis of right knee 08/23/2018  . Constipation due to opioid therapy 03/30/2018  . Retinopathy of both eyes 01/06/2018  . SNHL (sensorineural hearing loss) 12/04/2017  . Abnormal urinary stream 12/03/2017  . Osteoarthritis of carpometacarpal (CMC) joint of thumb 11/30/2017  . Muscle weakness 11/17/2017  . Transient vision disturbance 11/12/2017  . Bilateral hand pain 10/30/2017  . Pain of left hip joint 10/09/2017  . Gynecomastia 07/10/2017  . Iron deficiency anemia 07/05/2017  .  Spasticity 05/20/2017  . Lumbar radiculitis 04/20/2017  . Ulnar 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, left 11/28/2016  . Idiopathic peripheral neuropathy 11/28/2016  . 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 Long term (current) use of anticoagulants 08/23/2015  . Right upper quadrant abdominal pain 08/23/2015  . Memory loss 05/10/2015  . Gait abnormality 04/07/2015  . Alkaline phosphatase elevation 04/07/2015  . History of thrombosis 03/26/2015  . Medial epicondylitis 02/07/2015  . Cognitive decline 12/21/2014  . Leukopenia 12/05/2014  . Rotator cuff syndrome of right shoulder 10/27/2014  . Status post left knee replacement 08/22/2014  . Left lateral epicondylitis 08/22/2014  . Chronic cerebral ischemia 08/18/2014  . Arthrofibrosis of knee joint 08/17/2014  . Cubital canal compression syndrome, left 08/17/2014  . 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 Chronic pain syndrome 04/10/2014  . Insomnia 04/10/2014  . Chronic non-specific white matter lesions on MRI 04/10/2014  . CFS (chronic fatigue syndrome) 04/10/2014  . 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 Biceps tendonitis on left 03/01/2014  . Polycythemia vera ([place]) 12/27/2013  . H/O TIA (transient ischemic attack) and stroke 12/27/2013  . Neck pain 12/27/2013  . OSA (obstructive sleep apnea) 12/08/2013  . 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 Depression with anxiety 08/04/2013  . Protein C deficiency ([place]) 08/01/2013  . Post traumatic stress disorder (PTSD) 08/01/2013  . Speech abnormality 07/25/2013  . Obesity 05/10/2013  . Lower extremity edema 05/10/2013  .  GERD (gastroesophageal reflux disease) 05/10/2013  . Arthritis 05/10/2013  . OA (osteoarthritis) of knee 03/15/2013  . Headache 10/25/2012  . Palpitations 10/18/2012  . Fatty liver determined by biopsy 06/01/2012  . Arthropathic psoriasis, unspecified ([place]) 04/29/2012  . Abnormal liver enzymes 03/29/2012  . Psoriatic arthritis ([place]) 12/29/2011  . Left Renal  Hydronephrosis 12/11/2010  . 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 Celiac disease 05/27/2010  . Thyroid nodule 05/27/2010  . Male-to-male transgender person 09/20/2002    Arliss Journey, PT, DPT  04/13/2019, 3:02 PM  [facility] [address] [place], [place], [address] Phone: [phone]   Fax:  [phone]  Name: [name] MRN: [id number] Date of Birth: 07/20/1965

## 2019-04-13 NOTE — Therapy (Signed)
[facility] [address] [place], [place], [address] Phone: [phone]   Fax:  [phone]  Occupational Therapy Treatment  Patient Details  Name: Jesse Bowers MRN: [id number] Date of Birth: 1965-06-06 Referring Provider (OT): Dr. [name]   Encounter Date: 04/13/2019  OT End of Session - 04/13/19 1004    Visit Number  6    Number of Visits  13    Date for OT Re-Evaluation  04/23/19    Authorization Type  BC/BS primary, MCR secondary (however may lose part B) - 30 visit limit combined PT/OT (Same day counts as 1 visit)    Authorization Time Period  Plan goes from 11/2018-05/01/19    Authorization - Visit Number  6    Authorization - Number of Visits  15    OT Start Time  0850    OT Stop Time  0935    OT Time Calculation (min)  45 min    Activity Tolerance  Patient tolerated treatment well    Behavior During Therapy  [facility] for tasks assessed/performed       Past Medical History:  Diagnosis Date  . Abnormal weight loss   . Anxiety   . Arthritis   . Cataract    OU  . Celiac disease   . Cervical neck pain with evidence of disc disease    patient has a cyst   . Chronic constipation   . Chronic diastolic heart failure ([place])    Pt. denies  . Chronic pain   . Degenerative disc disease at L5-S1 level    with stenosis  . DVT (deep venous thrombosis) (HCC)    Right upper arm, bilateral leg  . Eczema    inguinal, feet  . Elevated liver enzymes   . 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 Family history of adverse reaction to anesthesia    family has problems with anesthesia of nausea and vomiting   . 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 GERD (gastroesophageal reflux disease)    History of in 20's  . Gluten enteropathy   . H/O parotitis    right   . Hard of hearing   . History of kidney stones   . History of retinal tear    Bilateral  . History of staph infection    required wound vac  . Hx-TIA  (transient ischemic attack)    2015  . LVH (left ventricular hypertrophy) 12/15/2016   Mild, noted on ECHO  . MVP (mitral valve prolapse)   . NAFL (nonalcoholic fatty liver)   . Neck pain   . Neuromuscular disorder ([place])    bilateral neuropathy feet.  . Pneumonia 12/17/2010  . Polycythemia   . Polycythemia, secondary   . PONV (postoperative nausea and vomiting)   . Protein C deficiency ([place])    Dr. Anne Fu  . Psoriasis    16 X10 cm psoriatic rash on sole of left foot ; open and occ scant bleeding;   . psoriatic arthritis   . PTSD (post-traumatic stress disorder)   . Scaphoid fracture of wrist 09/23/2013  . Seizure ([place])    childhood, medication until age 16 then weanned completely off  . Sleep apnea    split night study last done by Dr. [name] 06/18/15 shows severe OSA, CSA, and hypersomnia, rec bipap  . Splenomegaly   . Stenosis of ureteropelvic junction (UPJ)    left  . Stroke [facility])    CVA vs TIA in left cerebrum causing  slight right sided weakness-Dr. [name] follows  . Syrinx of spinal cord ([place]) 01/06/2014   c spine on MRI  . Tachycardia    hx of   . Transfusion history    past history- none recent, after surgeries due to blood loss  . Wears glasses   . Wears hearing aid     Past Surgical History:  Procedure Laterality Date  . ABDOMINAL HYSTERECTOMY Bilateral 1994   TAH, BSO- tranverse incision at 55 yo  . ANKLE ARTHROSCOPY WITH RECONSTRUCTION Right 2007  . CHOLECYSTECTOMY     laparoscopic  . COLONOSCOPY     x3  . EYE SURGERY     Left eye 03/02/2018, right 02/15/2018  . HIP ARTHROSCOPY W/ LABRAL REPAIR Right 05/11/2013   acetabular labral tear 03/30/2013  . KNEE ARTHROPLASTY Right   . KNEE JOINT MANIPULATION Left    x3 under anesthesia  . KNEE SURGERY Bilateral 1984   Right ACL, left PCL repair  . LITHOTRIPSY  2005  . LIVER BIOPSY  2013   normal results.  [name] MASTECTOMY Bilateral    prior to 2009  . MOUTH SURGERY    . NASAL SEPTUM SURGERY N/A  09/20/2015   by ENT Dr. [name]  . OVARIAN CYST SURGERY Left    size of grapefruit, was informed that she had shortened vagina  . SHOULDER SURGERY Bilateral    Right 08/15/2016, Left 11/15/2016  . THUMB ARTHROSCOPY Left   . THYROIDECTOMY, PARTIAL Left 2008  . TOTAL KNEE ARTHROPLASTY Right 08/23/2018   Procedure: TOTAL KNEE ARTHROPLASTY;  Surgeon: [name], MD;  Location: [facility];  Service: Orthopedics;  Laterality: Right;  18mn  . TOTAL KNEE REVISION Left 02/06/2016   Procedure: LEFT TOTAL KNEE REVISION;  Surgeon: FGaynelle Arabian MD;  Location: [facility];  Service: Orthopedics;  Laterality: Left;  . TOTAL KNEE REVISION Left 04/22/2017   Procedure: Left knee polyethylene revision;  Surgeon: AGaynelle Arabian MD;  Location: [facility];  Service: Orthopedics;  Laterality: Left;  . UPPER GI ENDOSCOPY  2003    There were no vitals filed for this visit.  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 PMH: Psoriatic arthritis, CVA, s/p Rt TKR 08/23/18, Lt knee revision 04/2017, lumbar radiculitis, multiple scaphoid fx's Rt thumb    Limitations  fall risk    Patient Stated Goals  cut my food, holding fork and pen/pencil better w/ less pain in Rt hand       Practiced making sandwich in standing w/ supervision/countertop support. Pt demo good safety during task. Pt did need assist opening tight new PB jar.  Pt retrieved bread from refrigerator from w/c level, but retrieved all other items side stepping along counter w/ 1 hand support.  Reviewed ADLS at home and potential further A/E needs including Rt angled ergonomic knives, long handled rocker knife, multi-purpose jar opener, item to assist w/ opening medicine bottles and keys. Pt also shown shelf liner for opening jars. Pt already has electric can opener and adapted door knobs.                        OT Short Term Goals - 04/13/19 1005      OT SHORT TERM GOAL #1   Title  Independent with UE strengthening HEP (shoulders and hands)  Time  3    Period  Weeks    Status  On-going      OT SHORT TERM GOAL #2   Title  Pt to verbalize understanding with potential A/E needs to increase ease and independence with ADLS (specifically cutting food, writing)    Time  3    Period  Weeks    Status  Achieved      OT SHORT TERM GOAL #3   Title  Pt to be assessed for potential splints to help reduce pain Rt thumb and Lt thumb prn    Time  3    Period  Weeks    Status  On-going        OT Long Term Goals - 04/13/19 1005      OT LONG TERM GOAL #1   Title  Pt to report greater ease with BADLS using A/E and compensatory strategies prn    Time  6    Period  Weeks    Status  On-going      OT LONG TERM GOAL #2   Title  Pt to improve bilateral grip strength by 5 lbs for gripping activities    Time  6    Period  Weeks    Status  On-going      OT LONG TERM GOAL #3   Title  Pt to perform snack prep/sandwich prep from standing level w/ rest breaks prn safely w/o LOB    Time  6    Period  Weeks    Status  Achieved            Plan - 04/13/19 1005    Clinical Impression Statement  Pt progressing towards all STG's and LTG's. Pt has met 1 STG and 1 LTG at this time.    Occupational performance deficits (Please refer to evaluation for details):  ADL's;IADL's;Leisure;Social Participation    Body Structure / Function / Physical Skills  ADL;Dexterity;ROM;IADL;Body mechanics;Mobility;Coordination;FMC;Pain;UE functional use;Decreased knowledge of use of DME;Sensation    Rehab Potential  Good    Comorbidities impacting occupational performance description:  psoriatic arthritis, lumbar radiculitis    OT Frequency  2x / week    OT Duration  6 weeks   or 12 visits over extended weeks   OT Treatment/Interventions  Self-care/ADL training;Therapeutic  exercise;Functional Mobility Training;Aquatic Therapy;Neuromuscular education;Manual Therapy;Splinting;Therapeutic activities;Coping strategies training;Energy conservation;DME and/or AE instruction;Passive range of motion;Patient/family education;Moist Heat    Plan  continue progress towards remaining goals    Consulted and Agree with Plan of Care  Patient       Patient will benefit from skilled therapeutic intervention in order to improve the following deficits and impairments:   Body Structure / Function / Physical Skills: ADL, Dexterity, ROM, IADL, Body mechanics, Mobility, Coordination, FMC, Pain, UE functional use, Decreased knowledge of use of DME, Sensation       Visit Diagnosis: Other symptoms and signs involving the musculoskeletal system  Muscle weakness (generalized)  Unsteadiness on feet    Problem List Patient Active Problem List   Diagnosis Date Noted  . Urinary dysfunction 04/12/2019  . Osteoarthritis of right knee 08/23/2018  . Constipation due to opioid therapy 03/30/2018  . Retinopathy of both eyes 01/06/2018  . SNHL (sensorineural hearing loss) 12/04/2017  . Abnormal urinary stream 12/03/2017  . Osteoarthritis of carpometacarpal (CMC) joint of thumb 11/30/2017  . Muscle weakness 11/17/2017  . Transient vision disturbance 11/12/2017  . Bilateral hand pain 10/30/2017  . Pain of left hip joint 10/09/2017  .  Gynecomastia 07/10/2017  . Iron deficiency anemia 07/05/2017  . Spasticity 05/20/2017  . Lumbar radiculitis 04/20/2017  . Ulnar 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, left 11/28/2016  . Idiopathic peripheral neuropathy 11/28/2016  . 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 Long term (current) use of anticoagulants 08/23/2015  . Right upper quadrant abdominal pain 08/23/2015  . Memory loss 05/10/2015  . Gait abnormality 04/07/2015  . Alkaline phosphatase elevation 04/07/2015  . History of thrombosis 03/26/2015  . Medial epicondylitis 02/07/2015  .  Cognitive decline 12/21/2014  . Leukopenia 12/05/2014  . Rotator cuff syndrome of right shoulder 10/27/2014  . Status post left knee replacement 08/22/2014  . Left lateral epicondylitis 08/22/2014  . Chronic cerebral ischemia 08/18/2014  . Arthrofibrosis of knee joint 08/17/2014  . Cubital canal compression syndrome, left 08/17/2014  . 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 Chronic pain syndrome 04/10/2014  . Insomnia 04/10/2014  . Chronic non-specific white matter lesions on MRI 04/10/2014  . CFS (chronic fatigue syndrome) 04/10/2014  . 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 Biceps tendonitis on left 03/01/2014  . Polycythemia vera ([place]) 12/27/2013  . H/O TIA (transient ischemic attack) and stroke 12/27/2013  . Neck pain 12/27/2013  . OSA (obstructive sleep apnea) 12/08/2013  . 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 Depression with anxiety 08/04/2013  . Protein C deficiency ([place]) 08/01/2013  . Post traumatic stress disorder (PTSD) 08/01/2013  . Speech abnormality 07/25/2013  . Obesity 05/10/2013  . Lower extremity edema 05/10/2013  . GERD (gastroesophageal reflux disease) 05/10/2013  . Arthritis 05/10/2013  . OA (osteoarthritis) of knee 03/15/2013  . Headache 10/25/2012  . Palpitations 10/18/2012  . Fatty liver determined by biopsy 06/01/2012  . Arthropathic psoriasis, unspecified ([place]) 04/29/2012  . Abnormal liver enzymes 03/29/2012  . Psoriatic arthritis ([place]) 12/29/2011  . Left Renal Hydronephrosis 12/11/2010  . 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 Celiac disease 05/27/2010  . Thyroid nodule 05/27/2010  . Male-to-male transgender person 09/20/2002    Carey Bullocks, OTR/L 04/13/2019, 12:11 PM  [facility] [address] [place], [place], [address] Phone: [phone]   Fax:  [phone]  Name: Micco Bourbeau MRN: [id number] Date of Birth: 1965-04-07

## 2019-04-14 ENCOUNTER — Ambulatory Visit (INDEPENDENT_AMBULATORY_CARE_PROVIDER_SITE_OTHER): Payer: BC Managed Care – PPO | Admitting: Psychology

## 2019-04-14 DIAGNOSIS — F4312 Post-traumatic stress disorder, chronic: Secondary | ICD-10-CM | POA: Diagnosis not present

## 2019-04-15 ENCOUNTER — Other Ambulatory Visit: Payer: Self-pay

## 2019-04-15 ENCOUNTER — Ambulatory Visit: Payer: BC Managed Care – PPO | Admitting: Physical Therapy

## 2019-04-15 ENCOUNTER — Encounter: Payer: Self-pay | Admitting: Occupational Therapy

## 2019-04-15 ENCOUNTER — Encounter: Payer: Self-pay | Admitting: Physical Therapy

## 2019-04-15 ENCOUNTER — Ambulatory Visit: Payer: BC Managed Care – PPO | Admitting: Occupational Therapy

## 2019-04-15 DIAGNOSIS — R2681 Unsteadiness on feet: Secondary | ICD-10-CM

## 2019-04-15 DIAGNOSIS — R29818 Other symptoms and signs involving the nervous system: Secondary | ICD-10-CM

## 2019-04-15 DIAGNOSIS — M6281 Muscle weakness (generalized): Secondary | ICD-10-CM

## 2019-04-15 DIAGNOSIS — R2689 Other abnormalities of gait and mobility: Secondary | ICD-10-CM | POA: Diagnosis not present

## 2019-04-15 DIAGNOSIS — R29898 Other symptoms and signs involving the musculoskeletal system: Secondary | ICD-10-CM

## 2019-04-15 DIAGNOSIS — R278 Other lack of coordination: Secondary | ICD-10-CM

## 2019-04-15 DIAGNOSIS — M79641 Pain in right hand: Secondary | ICD-10-CM

## 2019-04-15 NOTE — Therapy (Signed)
[facility] [address] [place], [place], [address] Phone: [phone]   Fax:  [phone]  Occupational Therapy Treatment  Patient Details  Name: Jesse Bowers MRN: [id number] Date of Birth: [birth year] Referring Provider (OT): Dr. [name]   Encounter Date: 04/15/2019  OT End of Session - 04/15/19 0833    Visit Number  7    Number of Visits  13    Date for OT Re-Evaluation  04/23/19    Authorization Type  BC/BS primary, MCR secondary (however may lose part B) - 30 visit limit combined PT/OT (Same day counts as 1 visit)    Authorization Time Period  Plan goes from 11/2018-05/01/19    Authorization - Visit Number  7    Authorization - Number of Visits  15    OT Start Time  [id number]    OT Stop Time  0930    OT Time Calculation (min)  38 min    Activity Tolerance  Patient tolerated treatment well    Behavior During Therapy  [facility] for tasks assessed/performed       Past Medical History:  Diagnosis Date  . Abnormal weight loss   . Anxiety   . Arthritis   . Cataract    OU  . Celiac disease   . Cervical neck pain with evidence of disc disease    patient has a cyst   . Chronic constipation   . Chronic diastolic heart failure ([place])    Pt. denies  . Chronic pain   . Degenerative disc disease at L5-S1 level    with stenosis  . DVT (deep venous thrombosis) (HCC)    Right upper arm, bilateral leg  . Eczema    inguinal, feet  . Elevated liver enzymes   . 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 Family history of adverse reaction to anesthesia    family has problems with anesthesia of nausea and vomiting   . 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 GERD (gastroesophageal reflux disease)    History of in 20's  . Gluten enteropathy   . H/O parotitis    right   . Hard of hearing   . History of kidney stones   . History of retinal tear    Bilateral  . History of staph infection    required wound vac  . Hx-TIA  (transient ischemic attack)    2015  . LVH (left ventricular hypertrophy) 12/15/2016   Mild, noted on ECHO  . MVP (mitral valve prolapse)   . NAFL (nonalcoholic fatty liver)   . Neck pain   . Neuromuscular disorder ([place])    bilateral neuropathy feet.  . Pneumonia 12/17/2010  . Polycythemia   . Polycythemia, secondary   . PONV (postoperative nausea and vomiting)   . Protein C deficiency ([place])    Dr. Anne Fu  . Psoriasis    16 X10 cm psoriatic rash on sole of left foot ; open and occ scant bleeding;   . psoriatic arthritis   . PTSD (post-traumatic stress disorder)   . Scaphoid fracture of wrist 09/23/2013  . Seizure ([place])    childhood, medication until age 71 then weanned completely off  . Sleep apnea    split night study last done by Dr. [name] 06/18/15 shows severe OSA, CSA, and hypersomnia, rec bipap  . Splenomegaly   . Stenosis of ureteropelvic junction (UPJ)    left  . Stroke [facility])    CVA vs TIA in left cerebrum causing  slight right sided weakness-Dr. [name] follows  . Syrinx of spinal cord ([place]) 01/06/2014   c spine on MRI  . Tachycardia    hx of   . Transfusion history    past history- none recent, after surgeries due to blood loss  . Wears glasses   . Wears hearing aid     Past Surgical History:  Procedure Laterality Date  . ABDOMINAL HYSTERECTOMY Bilateral 1994   TAH, BSO- tranverse incision at 53 yo  . ANKLE ARTHROSCOPY WITH RECONSTRUCTION Right 2007  . CHOLECYSTECTOMY     laparoscopic  . COLONOSCOPY     x3  . EYE SURGERY     Left eye 03/02/2018, right 02/15/2018  . HIP ARTHROSCOPY W/ LABRAL REPAIR Right 05/11/2013   acetabular labral tear 03/30/2013  . KNEE ARTHROPLASTY Right   . KNEE JOINT MANIPULATION Left    x3 under anesthesia  . KNEE SURGERY Bilateral 1984   Right ACL, left PCL repair  . LITHOTRIPSY  2005  . LIVER BIOPSY  2013   normal results.  [name] MASTECTOMY Bilateral    prior to 2009  . MOUTH SURGERY    . NASAL SEPTUM SURGERY N/A  09/20/2015   by ENT Dr. [name]  . OVARIAN CYST SURGERY Left    size of grapefruit, was informed that she had shortened vagina  . SHOULDER SURGERY Bilateral    Right 08/15/2016, Left 11/15/2016  . THUMB ARTHROSCOPY Left   . THYROIDECTOMY, PARTIAL Left 2008  . TOTAL KNEE ARTHROPLASTY Right 08/23/2018   Procedure: TOTAL KNEE ARTHROPLASTY;  Surgeon: [name], MD;  [facility];  Service: Orthopedics;  Laterality: Right;  46mn  . TOTAL KNEE REVISION Left 02/06/2016   Procedure: LEFT TOTAL KNEE REVISION;  Surgeon: FGaynelle Arabian MD;  [facility];  Service: Orthopedics;  Laterality: Left;  . TOTAL KNEE REVISION Left 04/22/2017   Procedure: Left knee polyethylene revision;  Surgeon: AGaynelle Arabian MD;  [facility]: [facility];  Service: Orthopedics;  Laterality: Left;  . UPPER GI ENDOSCOPY  2003    There were no vitals filed for this visit.  Subjective Assessment - 04/15/19 0833    Subjective   I get an MRI on Saturday for my Rt wrist and thumb.  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 PMH: Psoriatic arthritis, CVA, s/p Rt TKR 08/23/18, Lt knee revision 04/2017, lumbar radiculitis, multiple scaphoid fx's Rt thumb    Limitations  fall risk    Patient Stated Goals  cut my food, holding fork and pen/pencil better w/ less pain in Rt hand    Currently in Pain?  Yes    Pain Score  9     Pain Location  --   R thumb CMC and ulnar wrist   Pain Orientation  Right    Pain Descriptors / Indicators  Sharp    Pain Type  Chronic pain    Pain Onset  More than a month ago    Pain Frequency  Constant    Aggravating Factors   pinching    Pain Relieving Factors  rest         Functional reaching LUE to place/remove clothespins with 1-8lb resistance on vertical pole for incr strength, and activity tolerance.  Pt given min cueing for shoulder positioning (bilaterally) during task.  Educated pt in  importance of posture and make sure that shoulders are "back and down"  Pt educated in benefits/use of walker tray.  Recommended walker tray due to reports of furniture/wall walking and hx of falls/decr balance.  Pt reports wall walking to carry things.   Pt verbalized understanding.        OT Education - 04/15/19 0919    Education Details  Reviewed [employer] HEP--pt needed min-mod cueing for proper positioning of shoulders (pt tends to IR rotate and elevate shoulders)    Person(s) Educated  Patient    Methods  Explanation;Demonstration;Verbal cues    Comprehension  Verbalized understanding;Returned demonstration;Verbal cues required       OT Short Term Goals - 04/13/19 1005      OT SHORT TERM GOAL #1   Title  Independent with UE strengthening HEP (shoulders and hands)    Time  3    Period  Weeks    Status  On-going      OT SHORT TERM GOAL #2   Title  Pt to verbalize understanding with potential A/E needs to increase ease and independence with ADLS (specifically cutting food, writing)    Time  3    Period  Weeks    Status  Achieved      OT SHORT TERM GOAL #3   Title  Pt to be assessed for potential splints to help reduce pain Rt thumb and Lt thumb prn    Time  3    Period  Weeks    Status  On-going        OT Long Term Goals - 04/13/19 1005      OT LONG TERM GOAL #1   Title  Pt to report greater ease with BADLS using A/E and compensatory strategies prn    Time  6    Period  Weeks    Status  On-going      OT LONG TERM GOAL #2   Title  Pt to improve bilateral grip strength by 5 lbs for gripping activities    Time  6    Period  Weeks    Status  On-going      OT LONG TERM GOAL #3   Title  Pt to perform snack prep/sandwich prep from standing level w/ rest breaks prn safely w/o LOB    Time  6    Period  Weeks    Status  Achieved            Plan - 04/15/19 6378    Clinical Impression Statement  Pt is progressing towards goals.  However, will defer R  hand goals due to receiving further assessment and anticipate surgery R hand.    Occupational performance deficits (Please refer to evaluation for details):  ADL's;IADL's;Leisure;Social Participation    Body Structure / Function / Physical Skills  ADL;Dexterity;ROM;IADL;Body mechanics;Mobility;Coordination;FMC;Pain;UE functional use;Decreased knowledge of use of DME;Sensation    Rehab Potential  Good    Comorbidities impacting occupational performance description:  psoriatic arthritis, lumbar radiculitis    OT Frequency  2x / week    OT Duration  6 weeks   or 12 visits over extended weeks   OT Treatment/Interventions  Self-care/ADL training;Therapeutic exercise;Functional Mobility Training;Aquatic Therapy;Neuromuscular education;Manual Therapy;Splinting;Therapeutic activities;Coping strategies training;Energy conservation;DME and/or AE instruction;Passive range of motion;Patient/family education;Moist Heat    Plan  continue progress towards remaining goals, ?d/c next week    Consulted and Agree with Plan of Care  Patient       Patient will benefit from skilled therapeutic intervention in order to improve the following deficits and impairments:  Body Structure / Function / Physical Skills: ADL, Dexterity, ROM, IADL, Body mechanics, Mobility, Coordination, FMC, Pain, UE functional use, Decreased knowledge of use of DME, Sensation       Visit Diagnosis: Muscle weakness (generalized)  Other lack of coordination  Other symptoms and signs involving the nervous system  Pain in right hand  Unsteadiness on feet  Other symptoms and signs involving the musculoskeletal system    Problem List Patient Active Problem List   Diagnosis Date Noted  . Urinary dysfunction 04/12/2019  . Osteoarthritis of right knee 08/23/2018  . Constipation due to opioid therapy 03/30/2018  . Retinopathy of both eyes 01/06/2018  . SNHL (sensorineural hearing loss) 12/04/2017  . Abnormal urinary stream  12/03/2017  . Osteoarthritis of carpometacarpal (CMC) joint of thumb 11/30/2017  . Muscle weakness 11/17/2017  . Transient vision disturbance 11/12/2017  . Bilateral hand pain 10/30/2017  . Pain of left hip joint 10/09/2017  . Gynecomastia 07/10/2017  . Iron deficiency anemia 07/05/2017  . Spasticity 05/20/2017  . Lumbar radiculitis 04/20/2017  . Ulnar 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, left 11/28/2016  . Idiopathic peripheral neuropathy 11/28/2016  . 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 Long term (current) use of anticoagulants 08/23/2015  . Right upper quadrant abdominal pain 08/23/2015  . Memory loss 05/10/2015  . Gait abnormality 04/07/2015  . Alkaline phosphatase elevation 04/07/2015  . History of thrombosis 03/26/2015  . Medial epicondylitis 02/07/2015  . Cognitive decline 12/21/2014  . Leukopenia 12/05/2014  . Rotator cuff syndrome of right shoulder 10/27/2014  . Status post left knee replacement 08/22/2014  . Left lateral epicondylitis 08/22/2014  . Chronic cerebral ischemia 08/18/2014  . Arthrofibrosis of knee joint 08/17/2014  . Cubital canal compression syndrome, left 08/17/2014  . 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 Chronic pain syndrome 04/10/2014  . Insomnia 04/10/2014  . Chronic non-specific white matter lesions on MRI 04/10/2014  . CFS (chronic fatigue syndrome) 04/10/2014  . 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 Biceps tendonitis on left 03/01/2014  . Polycythemia vera ([place]) 12/27/2013  . H/O TIA (transient ischemic attack) and stroke 12/27/2013  . Neck pain 12/27/2013  . OSA (obstructive sleep apnea) 12/08/2013  . 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 Depression with anxiety 08/04/2013  . Protein C deficiency ([place]) 08/01/2013  . Post traumatic stress disorder (PTSD) 08/01/2013  . Speech abnormality 07/25/2013  . Obesity 05/10/2013  . Lower extremity edema 05/10/2013  . GERD (gastroesophageal reflux disease) 05/10/2013  . Arthritis  05/10/2013  . OA (osteoarthritis) of knee 03/15/2013  . Headache 10/25/2012  . Palpitations 10/18/2012  . Fatty liver determined by biopsy 06/01/2012  . Arthropathic psoriasis, unspecified ([place]) 04/29/2012  . Abnormal liver enzymes 03/29/2012  . Psoriatic arthritis ([place]) 12/29/2011  . Left Renal Hydronephrosis 12/11/2010  . 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 Celiac disease 05/27/2010  . Thyroid nodule 05/27/2010  . Male-to-male transgender person 09/20/2002    [facility] 04/15/2019, 9:46 AM  [facility] [address] [place], [place], [address] Phone: [phone]   Fax:  [phone]  Name: Jesse Bowers MRN: [id number] Date of Birth: May 20, 1965   Vianne Bulls, OTR/L [facility] [address] [place], [place]  [address] [phone] phone [phone] 04/15/19 9:46 AM

## 2019-04-15 NOTE — Therapy (Signed)
[facility] [address] [place], [place], [address] Phone: [phone]   Fax:  [phone]  Physical Therapy Treatment/10th Visit Progress Note  Patient Details  Name: Jesse Bowers MRN: [id number] Date of Birth: [birth year] Referring Provider (PT): Clerance Lav, MD  10th Visit Physical Therapy Progress Note  Dates of Reporting Period: 03/08/19 to 04/15/19    Encounter Date: 04/15/2019  PT End of Session - 04/15/19 0858    Visit Number  10    Number of Visits  13    Date for PT Re-Evaluation  05/08/19    Authorization Type  [employer] primary -September 2020-Feb 28th 2021, [facility] secondary (however may lose part B) - 30 visit limit combined PT/OT (Same day counts as 1 visit)    Authorization - Visit Number  13    Authorization - Number of Visits  30    PT Start Time  0813   pt arrived late   PT Stop Time  0849    PT Time Calculation (min)  36 min    Activity Tolerance  Patient tolerated treatment well    Behavior During Therapy  [facility] for tasks assessed/performed   verbose, needs re-direction at times      Past Medical History:  Diagnosis Date  . Abnormal weight loss   . Anxiety   . Arthritis   . Cataract    OU  . Celiac disease   . Cervical neck pain with evidence of disc disease    patient has a cyst   . Chronic constipation   . Chronic diastolic heart failure ([place])    Pt. denies  . Chronic pain   . Degenerative disc disease at L5-S1 level    with stenosis  . DVT (deep venous thrombosis) (HCC)    Right upper arm, bilateral leg  . Eczema    inguinal, feet  . Elevated liver enzymes   . 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 Family history of adverse reaction to anesthesia    family has problems with anesthesia of nausea and vomiting   . 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 GERD (gastroesophageal reflux disease)    History of in 20's  . Gluten enteropathy   . H/O parotitis    right   . Hard of  hearing   . History of kidney stones   . History of retinal tear    Bilateral  . History of staph infection    required wound vac  . Hx-TIA (transient ischemic attack)    2015  . LVH (left ventricular hypertrophy) 12/15/2016   Mild, noted on ECHO  . MVP (mitral valve prolapse)   . NAFL (nonalcoholic fatty liver)   . Neck pain   . Neuromuscular disorder ([place])    bilateral neuropathy feet.  . Pneumonia 12/17/2010  . Polycythemia   . Polycythemia, secondary   . PONV (postoperative nausea and vomiting)   . Protein C deficiency ([place])    Dr. Anne Fu  . Psoriasis    16 X10 cm psoriatic rash on sole of left foot ; open and occ scant bleeding;   . psoriatic arthritis   . PTSD (post-traumatic stress disorder)   . Scaphoid fracture of wrist 09/23/2013  . Seizure ([place])    childhood, medication until age 45 then weanned completely off  . Sleep apnea    split night study last done by Dr. [name] 06/18/15 shows severe OSA, CSA, and hypersomnia, rec bipap  . Splenomegaly   .  Stenosis of ureteropelvic junction (UPJ)    left  . Stroke [facility])    CVA vs TIA in left cerebrum causing slight right sided weakness-Dr. [name] follows  . Syrinx of spinal cord ([place]) 01/06/2014   c spine on MRI  . Tachycardia    hx of   . Transfusion history    past history- none recent, after surgeries due to blood loss  . Wears glasses   . Wears hearing aid     Past Surgical History:  Procedure Laterality Date  . ABDOMINAL HYSTERECTOMY Bilateral 1994   TAH, BSO- tranverse incision at 54 yo  . ANKLE ARTHROSCOPY WITH RECONSTRUCTION Right 2007  . CHOLECYSTECTOMY     laparoscopic  . COLONOSCOPY     x3  . EYE SURGERY     Left eye 03/02/2018, right 02/15/2018  . HIP ARTHROSCOPY W/ LABRAL REPAIR Right 05/11/2013   acetabular labral tear 03/30/2013  . KNEE ARTHROPLASTY Right   . KNEE JOINT MANIPULATION Left    x3 under anesthesia  . KNEE SURGERY Bilateral 1984   Right ACL, left PCL repair  . LITHOTRIPSY   2005  . LIVER BIOPSY  2013   normal results.  [name] MASTECTOMY Bilateral    prior to 2009  . MOUTH SURGERY    . NASAL SEPTUM SURGERY N/A 09/20/2015   by ENT Dr. [name]  . OVARIAN CYST SURGERY Left    size of grapefruit, was informed that she had shortened vagina  . SHOULDER SURGERY Bilateral    Right 08/15/2016, Left 11/15/2016  . THUMB ARTHROSCOPY Left   . THYROIDECTOMY, PARTIAL Left 2008  . TOTAL KNEE ARTHROPLASTY Right 08/23/2018   Procedure: TOTAL KNEE ARTHROPLASTY;  Surgeon: [name], MD;  Location: [facility];  Service: Orthopedics;  Laterality: Right;  20mn  . TOTAL KNEE REVISION Left 02/06/2016   Procedure: LEFT TOTAL KNEE REVISION;  Surgeon: FGaynelle Arabian MD;  Location: [facility];  Service: Orthopedics;  Laterality: Left;  . TOTAL KNEE REVISION Left 04/22/2017   Procedure: Left knee polyethylene revision;  Surgeon: AGaynelle Arabian MD;  Location: [facility];  Service: Orthopedics;  Laterality: Left;  . UPPER GI ENDOSCOPY  2003    There were no vitals filed for this visit.  Subjective Assessment - 04/15/19 0815    Subjective  No falls. Almost slipped this morning trying to take his dog out. Is having an MRI tomorrow of R wrist and thumb.    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 PMH: Psoriatic arthritis, Polycythemia vera, CVA, s/p Rt TKR 08/23/18, Lt knee revision 04/2017, lumbar radiculitis, multiple scaphoid fx's Rt thumb, chronic pain    How long can you walk comfortably?  can walk around the house - furniture walking    Diagnostic tests  MRI cervical spine 07/2017: Unchanged C4 through C6 cervical spinal cord syrinx since the MRIin March 2016, and stable by report since October 2015., MRI brain 2019: No acute intracranial abnormality and stable noncontrast MRI appearance of the brain since 2017.    Patient Stated Goals  can't get up without using his hands, wants to be able to get out of his chair without using his hands - less pain in  legs and back, be stronger.    Currently in Pain?  Yes    Pain Score  4     Pain Location  Knee    Pain Orientation  Right   lateral   Pain Type  Acute pain  Pain Onset  More than a month ago    Pain Relieving Factors  changing positions.                     Access Code: [email or web address]  URL: [email or web address]  Date: 04/15/2019  Prepared by: Janann August   Verbally reviewed strengthening exercises to HEP:  Exercises Supine Bridge - 10 reps - 2 sets - 2x daily - 5-6x weekly Clamshell - 10 reps - 2 sets - 2x daily - 5-6x weekly Sidelying Hip Abduction - 10 reps - 2 sets - 2x daily - 5-6x weekly Supine Active Straight Leg Raise - 10 reps - 2 sets - 2x daily - 5-6x weekly Seated Long Arc Quad - 10 reps - 2 sets - 2x daily - 5-6x weekly Seated Heel Toe Raises - 10 reps - 2 sets - 2x daily - 5-6x weekly Supine March - 10 reps - 2 sets - 2x daily - 5-6x weekly  New additions to HEP:  Supine Lower Trunk Rotation - 10 reps - 2 sets - 10-20 hold - 1x daily - 5-6x weekly, gentle twist, pt reported decr R low back pain after performing   Modified [place] Stretch - 3 sets - 45 hold - 2x daily - 5-6x weekly - discussed how to perform at home with step to place R or LLE on step for desired stretch, 2 x 45-60 seconds B   Prone Quadricep Stretch with Strap - 3 reps - 30-45 hold - 2x daily - 5-6x weekly -pt has been performing at home with reaching back and grabbing foot and reporting incr pain in low back, demonstrated and showed pt how to perform at home using a strap on foot to decrease strain/torque on low back, pt reported relief, performed propped on elbows.  sidelying hip stretch - 3 sets - 30 hold - 2x daily - 5-6x weekly - performed sidelying IT band stretch on RLE, verbal cues and demo how to get into proper position.    [place] Adult PT Treatment/Exercise - 04/15/19 0001      Exercises   Exercises  Other Exercises    Other Exercises   showed pt where to  purchase padded hand grip covers on RW to decr strain on B wrists and decr fatigue, due to pt reporting his physician stating he needs to put less weight through [place] when ambulating              PT Education - 04/15/19 0856    Education Details  wrist hand grip for RW to decr pressure, LE stretches additions to HEP    Person(s) Educated  Patient    Methods  Explanation;Demonstration   sent via email   Comprehension  Verbalized understanding;Returned demonstration       PT Short Term Goals - 04/15/19 0909      PT SHORT TERM GOAL #1   Title  Pt will be independent with initial HEP for improved strength, balance, and gait. ALL STGS DUE 04/06/19    Time  4   due to delay in scheduling   Period  Weeks    Status  Achieved    Target Date  04/06/19      PT SHORT TERM GOAL #2   Title  Patient will undergo further assessment of TUG with use of RW vs. BERG  in order to determine fall risk - goal written as appropriate.    Baseline  performed TUG with RW    Time  4  Period  Weeks    Status  Achieved      PT SHORT TERM GOAL #3   Title  Patient will verbalize understanding of fall prevention strategies in the home.    Time  4    Period  Weeks    Status  Deferred      PT SHORT TERM GOAL #4   Title  Patient will ambulate at least 17' with RW with supervision in order to safely ambulate at home.    Baseline  pt has ambulated 115' in clinic with min guard    Time  4    Period  Weeks    Status  Partially Met      PT SHORT TERM GOAL #5   Title  Patient will perform 4 steps with supervision using single railing and step to pattern in order to increase safety at home with stairs.    Baseline  4 steps with supervision/min guard with BUE support on railings and step to pattern.    Time  4    Period  Weeks    Status  Not Met      PT SHORT TERM GOAL #6   Title  Perform 5x sit to stand from standard arm chair vs. mat table and write goal as appropriate.    Baseline  met on 04/08/19     Time  4    Period  Weeks    Status  Achieved        PT Long Term Goals - 04/08/19 1434      PT LONG TERM GOAL #1   Title  Pt will be independent with final HEP for land/aquatic therapy for improved strength, ROM, balance, and gait. ALL LTGS DUE 05/04/19    Time  8   due to delay in scheduling   Period  Weeks    Status  New      PT LONG TERM GOAL #2   Title  Pt will decr TUG score with RW to 24 seconds or less with RW to decr fall risk.    Baseline  29.38 seconds with RW    Time  8    Period  Weeks    Status  New      PT LONG TERM GOAL #3   Title  Patient will ambulate at least 150' with RW with mod I in order to safely ambulate at home.    Baseline  not yet assessed.    Time  8    Period  Weeks    Status  Revised      PT LONG TERM GOAL #4   Title  Patient will perform 12 steps with supervision using single railing and step to pattern in order to increase safety at home with stairs.    Baseline  not yet assessed.    Time  8    Period  Weeks    Status  New      PT LONG TERM GOAL #5   Title  Patient will perform bed mobility, stap step transfers with RW with mod I in order to decrease caregiver burden.    Baseline  not yet assessed.    Time  8    Period  Weeks    Status  New      PT LONG TERM GOAL #6   Title  Pt will decr 5x sit <> stand time from 36 seconds to 30 seconds or less from mat table using BUE support to demo improved  functional LE strength.    Time  8    Period  Weeks    Status  New            Plan - 04/15/19 0909    Clinical Impression Statement  10th visit progress note: Pt has met 3 out of 6 LTGs. Partially met LTG #4 in regards to gait, pt needs supervision/occassional min guard for gait in clinic with RW. Did not meet LTG #5 in regards to steps - needs BUE support on railings for support, and min guard when descending stairs. Focus of today's session was adding stretches for mid/low back pain, IT band tightness, and hip flexor/quad tightness. Pt  able to demo all correctly after initial cueing. Added to HEP and briefly verbally reviewed strengthening exercises on pt's HEP. Will continue to progress towards LTGs to decr fall risk and improve functional mobility .    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 PMH: Psoriatic arthritis, Polycythemia vera, CVA, s/p Rt TKR 08/23/18, Lt knee revision 04/2017, lumbar radiculitis, multiple scaphoid fx's Rt thumb, chronic pain    Examination-Activity Limitations  Transfers;Stairs;Locomotion Level;Stand    Examination-Participation Restrictions  Community Activity    Stability/Clinical Decision Making  Evolving/Moderate complexity    Rehab Potential  Good    PT Frequency  2x / week    PT Duration  6 weeks    PT Treatment/Interventions  ADLs/Self Care Home Management;Neuromuscular re-education;Therapeutic activities;Therapeutic exercise;Patient/family education;Manual techniques;Scar mobilization;Passive range of motion;Manual lymph drainage;Electrical Stimulation;Aquatic Therapy;Energy conservation;Gait training;Stair training;Functional mobility training;DME Instruction;Balance training;Orthotic Fit/Training    PT Next Visit Plan  fall prevention strategies in the home. continue LE strengthening, gait training (walking around obstacles),  standing balance in // bars, fall prevention strategy handout. Nustep for strengthening/endurance    PT Home Exercise Plan  [email or web address]    Consulted and Agree with Plan of Care  Patient       Patient will benefit from skilled therapeutic intervention in order to improve the following deficits and impairments:  Decreased range of motion, Decreased endurance, Decreased activity tolerance, Abnormal gait, Difficulty walking, Decreased coordination, Decreased balance, Decreased strength, Pain, Decreased mobility, Decreased safety awareness  Visit Diagnosis: Other lack of  coordination  Muscle weakness (generalized)  Other symptoms and signs involving the nervous system  Unsteadiness on feet     Problem List Patient Active Problem List   Diagnosis Date Noted  . Urinary dysfunction 04/12/2019  . Osteoarthritis of right knee 08/23/2018  . Constipation due to opioid therapy 03/30/2018  . Retinopathy of both eyes 01/06/2018  . SNHL (sensorineural hearing loss) 12/04/2017  . Abnormal urinary stream 12/03/2017  . Osteoarthritis of carpometacarpal (CMC) joint of thumb 11/30/2017  . Muscle weakness 11/17/2017  . Transient vision disturbance 11/12/2017  . Bilateral hand pain 10/30/2017  . Pain of left hip joint 10/09/2017  . Gynecomastia 07/10/2017  . Iron deficiency anemia 07/05/2017  . Spasticity 05/20/2017  . Lumbar radiculitis 04/20/2017  . Ulnar 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, left 11/28/2016  . Idiopathic peripheral neuropathy 11/28/2016  . 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 Long term (current) use of anticoagulants 08/23/2015  . Right upper quadrant abdominal pain 08/23/2015  . Memory loss 05/10/2015  . Gait abnormality 04/07/2015  . Alkaline phosphatase elevation 04/07/2015  . History of thrombosis 03/26/2015  . Medial epicondylitis 02/07/2015  . Cognitive decline 12/21/2014  . Leukopenia 12/05/2014  . Rotator cuff syndrome of right  shoulder 10/27/2014  . Status post left knee replacement 08/22/2014  . Left lateral epicondylitis 08/22/2014  . Chronic cerebral ischemia 08/18/2014  . Arthrofibrosis of knee joint 08/17/2014  . Cubital canal compression syndrome, left 08/17/2014  . 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 Chronic pain syndrome 04/10/2014  . Insomnia 04/10/2014  . Chronic non-specific white matter lesions on MRI 04/10/2014  . CFS (chronic fatigue syndrome) 04/10/2014  . 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) 03/01/2014  . Biceps tendonitis on left 03/01/2014  . Polycythemia vera ([place]) 12/27/2013  . H/O TIA (transient ischemic  attack) and stroke 12/27/2013  . Neck pain 12/27/2013  . OSA (obstructive sleep apnea) 12/08/2013  . 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 Depression with anxiety 08/04/2013  . Protein C deficiency ([place]) 08/01/2013  . Post traumatic stress disorder (PTSD) 08/01/2013  . Speech abnormality 07/25/2013  . Obesity 05/10/2013  . Lower extremity edema 05/10/2013  . GERD (gastroesophageal reflux disease) 05/10/2013  . Arthritis 05/10/2013  . OA (osteoarthritis) of knee 03/15/2013  . Headache 10/25/2012  . Palpitations 10/18/2012  . Fatty liver determined by biopsy 06/01/2012  . Arthropathic psoriasis, unspecified ([place]) 04/29/2012  . Abnormal liver enzymes 03/29/2012  . Psoriatic arthritis ([place]) 12/29/2011  . Left Renal Hydronephrosis 12/11/2010  . 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 Celiac disease 05/27/2010  . Thyroid nodule 05/27/2010  . Male-to-male transgender person 09/20/2002    Arliss Journey, PT, DPT  04/15/2019, 9:14 AM  [facility] [address] [place], [place], [address] Phone: [phone]   Fax:  [phone]  Name: Jesse Bowers MRN: [id number] Date of Birth: Nov 25, 1965

## 2019-04-15 NOTE — Patient Instructions (Signed)
Access Code: [email or web address]  URL: [email or web address]  Date: 04/15/2019  Prepared by: Janann August   Exercises Supine Bridge - 10 reps - 2 sets - 2x daily - 5-6x weekly Clamshell - 10 reps - 2 sets - 2x daily - 5-6x weekly Sidelying Hip Abduction - 10 reps - 2 sets - 2x daily - 5-6x weekly Supine Active Straight Leg Raise - 10 reps - 2 sets - 2x daily - 5-6x weekly Seated Long Arc Quad - 10 reps - 2 sets - 2x daily - 5-6x weekly Seated Heel Toe Raises - 10 reps - 2 sets - 2x daily - 5-6x weekly Supine March - 10 reps - 2 sets - 2x daily - 5-6x weekly Supine Lower Trunk Rotation - 10 reps - 2 sets - 10-20 hold - 1x daily - 5-6x weekly Modified [place] Stretch - 3 sets - 45 hold - 2x daily - 5-6x weekly Prone Quadricep Stretch with Strap - 3 reps - 30-45 hold - 2x daily - 5-6x weekly sidelying IT band hip stretch - 3 sets - 30 hold - 2x daily - 5-6x weekly

## 2019-04-18 ENCOUNTER — Ambulatory Visit: Payer: BC Managed Care – PPO | Admitting: Physical Therapy

## 2019-04-19 ENCOUNTER — Other Ambulatory Visit: Payer: Self-pay

## 2019-04-19 ENCOUNTER — Ambulatory Visit (INDEPENDENT_AMBULATORY_CARE_PROVIDER_SITE_OTHER): Payer: BC Managed Care – PPO | Admitting: Psychology

## 2019-04-19 ENCOUNTER — Encounter (HOSPITAL_COMMUNITY)
Admission: RE | Admit: 2019-04-19 | Discharge: 2019-04-19 | Disposition: A | Discharge status: Home / Self Care | Payer: BC Managed Care – PPO | Source: Ambulatory Visit | Attending: Urology | Admitting: Urology

## 2019-04-19 DIAGNOSIS — F4312 Post-traumatic stress disorder, chronic: Secondary | ICD-10-CM | POA: Diagnosis not present

## 2019-04-19 DIAGNOSIS — Q6211 Congenital occlusion of ureteropelvic junction: Secondary | ICD-10-CM | POA: Insufficient documentation

## 2019-04-19 MED ORDER — FUROSEMIDE 10 MG/ML IJ SOLN
INTRAMUSCULAR | Status: AC
  Filled 2019-04-19: qty 8

## 2019-04-19 MED ORDER — TECHNETIUM TC 99M MERTIATIDE
5.2000 | Freq: Once | INTRAVENOUS | Status: AC
  Administered 2019-04-19: 5.2 via INTRAVENOUS

## 2019-04-20 ENCOUNTER — Ambulatory Visit: Payer: BC Managed Care – PPO | Admitting: Physical Therapy

## 2019-04-20 ENCOUNTER — Ambulatory Visit: Payer: BC Managed Care – PPO | Admitting: Occupational Therapy

## 2019-04-21 ENCOUNTER — Ambulatory Visit (INDEPENDENT_AMBULATORY_CARE_PROVIDER_SITE_OTHER): Payer: BC Managed Care – PPO | Admitting: Psychology

## 2019-04-21 ENCOUNTER — Ambulatory Visit: Payer: BC Managed Care – PPO | Admitting: Psychology

## 2019-04-21 ENCOUNTER — Other Ambulatory Visit: Payer: BC Managed Care – PPO

## 2019-04-21 ENCOUNTER — Ambulatory Visit: Payer: Medicare Other | Admitting: Psychology

## 2019-04-21 DIAGNOSIS — F4312 Post-traumatic stress disorder, chronic: Secondary | ICD-10-CM

## 2019-04-22 ENCOUNTER — Ambulatory Visit: Payer: BC Managed Care – PPO | Admitting: Physical Therapy

## 2019-04-22 ENCOUNTER — Other Ambulatory Visit: Payer: Self-pay

## 2019-04-22 ENCOUNTER — Ambulatory Visit: Payer: BC Managed Care – PPO | Admitting: Occupational Therapy

## 2019-04-22 ENCOUNTER — Encounter: Payer: Self-pay | Admitting: Occupational Therapy

## 2019-04-22 DIAGNOSIS — M6281 Muscle weakness (generalized): Secondary | ICD-10-CM

## 2019-04-22 DIAGNOSIS — R278 Other lack of coordination: Secondary | ICD-10-CM

## 2019-04-22 DIAGNOSIS — R29818 Other symptoms and signs involving the nervous system: Secondary | ICD-10-CM

## 2019-04-22 DIAGNOSIS — R29898 Other symptoms and signs involving the musculoskeletal system: Secondary | ICD-10-CM

## 2019-04-22 DIAGNOSIS — R2689 Other abnormalities of gait and mobility: Secondary | ICD-10-CM | POA: Diagnosis not present

## 2019-04-22 DIAGNOSIS — M79641 Pain in right hand: Secondary | ICD-10-CM

## 2019-04-22 DIAGNOSIS — R2681 Unsteadiness on feet: Secondary | ICD-10-CM

## 2019-04-22 NOTE — Therapy (Signed)
[facility] [address] [place], [place], [address] Phone: [phone]   Fax:  [phone]  Occupational Therapy Treatment  Patient Details  Name: Jesse Bowers MRN: [id number] Date of Birth: [birth year] Referring Provider (OT): Dr. [name]   Encounter Date: 04/22/2019  OT End of Session - 04/22/19 1035    Visit Number  8    Number of Visits  13    Date for OT Re-Evaluation  04/23/19    Authorization Type  BC/BS primary, MCR secondary (however may lose part B) - 30 visit limit combined PT/OT (Same day counts as 1 visit)    Authorization Time Period  Plan goes from 11/2018-05/01/19    Authorization - Visit Number  8    Authorization - Number of Visits  15    OT Start Time  7353    OT Stop Time  1105    OT Time Calculation (min)  42 min    Activity Tolerance  Patient tolerated treatment well    Behavior During Therapy  [facility] for tasks assessed/performed       Past Medical History:  Diagnosis Date  . Abnormal weight loss   . Anxiety   . Arthritis   . Cataract    OU  . Celiac disease   . Cervical neck pain with evidence of disc disease    patient has a cyst   . Chronic constipation   . Chronic diastolic heart failure ([place])    Pt. denies  . Chronic pain   . Degenerative disc disease at L5-S1 level    with stenosis  . DVT (deep venous thrombosis) (H[facility])    Right upper arm, bilateral leg  . Eczema    inguinal, feet  . Elevated liver enzymes   . 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 Family history of adverse reaction to anesthesia    family has problems with anesthesia of nausea and vomiting   . 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 GERD (gastroesophageal reflux disease)    History of in 20's  . Gluten enteropathy   . H/O parotitis    right   . Hard of hearing   . History of kidney stones   . History of retinal tear    Bilateral  . History of staph infection    required wound vac  . Hx-TIA  (transient ischemic attack)    2015  . LVH (left ventricular hypertrophy) 12/15/2016   Mild, noted on ECHO  . MVP (mitral valve prolapse)   . NAFL (nonalcoholic fatty liver)   . Neck pain   . Neuromuscular disorder ([place])    bilateral neuropathy feet.  . Pneumonia 12/17/2010  . Polycythemia   . Polycythemia, secondary   . PONV (postoperative nausea and vomiting)   . Protein C deficiency ([place])    Dr. Anne Fu  . Psoriasis    16 X10 cm psoriatic rash on sole of left foot ; open and occ scant bleeding;   . psoriatic arthritis   . PTSD (post-traumatic stress disorder)   . Scaphoid fracture of wrist 09/23/2013  . Seizure ([place])    childhood, medication until age 62 then weanned completely off  . Sleep apnea    split night study last done by Dr. [name] 06/18/15 shows severe OSA, CSA, and hypersomnia, rec bipap  . Splenomegaly   . Stenosis of ureteropelvic junction (UPJ)    left  . Stroke [facility])    CVA vs TIA in left cerebrum causing  slight right sided weakness-Dr. [name] follows  . Syrinx of spinal cord ([place]) 01/06/2014   c spine on MRI  . Tachycardia    hx of   . Transfusion history    past history- none recent, after surgeries due to blood loss  . Wears glasses   . Wears hearing aid     Past Surgical History:  Procedure Laterality Date  . ABDOMINAL HYSTERECTOMY Bilateral 1994   TAH, BSO- tranverse incision at 54 yo  . ANKLE ARTHROSCOPY WITH RECONSTRUCTION Right 2007  . CHOLECYSTECTOMY     laparoscopic  . COLONOSCOPY     x3  . EYE SURGERY     Left eye 03/02/2018, right 02/15/2018  . HIP ARTHROSCOPY W/ LABRAL REPAIR Right 05/11/2013   acetabular labral tear 03/30/2013  . KNEE ARTHROPLASTY Right   . KNEE JOINT MANIPULATION Left    x3 under anesthesia  . KNEE SURGERY Bilateral 1984   Right ACL, left PCL repair  . LITHOTRIPSY  2005  . LIVER BIOPSY  2013   normal results.  [name] MASTECTOMY Bilateral    prior to 2009  . MOUTH SURGERY    . NASAL SEPTUM SURGERY N/A  09/20/2015   by ENT Dr. [name]  . OVARIAN CYST SURGERY Left    size of grapefruit, was informed that she had shortened vagina  . SHOULDER SURGERY Bilateral    Right 08/15/2016, Left 11/15/2016  . THUMB ARTHROSCOPY Left   . THYROIDECTOMY, PARTIAL Left 2008  . TOTAL KNEE ARTHROPLASTY Right 08/23/2018   Procedure: TOTAL KNEE ARTHROPLASTY;  Surgeon: [name], MD;  Location: [facility];  Service: Orthopedics;  Laterality: Right;  59mn  . TOTAL KNEE REVISION Left 02/06/2016   Procedure: LEFT TOTAL KNEE REVISION;  Surgeon: FGaynelle Arabian MD;  Location: [facility];  Service: Orthopedics;  Laterality: Left;  . TOTAL KNEE REVISION Left 04/22/2017   Procedure: Left knee polyethylene revision;  Surgeon: AGaynelle Arabian MD;  Location: [facility];  Service: Orthopedics;  Laterality: Left;  . UPPER GI ENDOSCOPY  2003    There were no vitals filed for this visit.  Subjective Assessment - 04/22/19 1026    Subjective   Pt reports that he didn't get to see Dr. 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 PMH: Psoriatic arthritis, CVA, s/p Rt TKR 08/23/18, Lt knee revision 04/2017, lumbar radiculitis, multiple scaphoid fx's Rt thumb    Limitations  fall risk    Patient Stated Goals  cut my food, holding fork and pen/pencil better w/ less pain in Rt hand    Currently in Pain?  Yes   L thumb 5/10 today at IP   Pain Score  7     Pain Location  Wrist   thumb/wrist   Pain Orientation  Right    Pain Descriptors / Indicators  Sharp    Pain Type  Chronic pain    Pain Onset  More than a month ago    Pain Frequency  Constant    Aggravating Factors   used snake to clear sink    Pain Relieving Factors  rest         [facility] OT Assessment - 04/22/19 0001      Hand Function   Right Hand Grip (lbs)  19.4    Left Hand Grip (lbs)  20       Began checking goals and discussing progress.  Pt is meeting  with Dr. Caralyn Guile today to discuss MRI results/options for R hand pain.  Therefore, OT has not been addressing R hand strength/pain.    Reviewed theraband HEP.--pt returned demo each x15 with each UE with min v.c. for positioning.    Discussed ADLs/stratgies.  Reviewed previously recommended AE and recommended pt continue to evaluate new items as things are replaced to look for things that are lightweight and easy to manipulate, have levers/pumps to decr pinch needed and reduce stress on joints for joint protection.     OT Short Term Goals - 04/22/19 1047      OT SHORT TERM GOAL #1   Title  Independent with UE strengthening HEP (shoulders and hands)    Time  3    Period  Weeks    Status  Achieved   04/22/19:  limited/unable with R hand due to pain/under care of Ortho MD     OT SHORT TERM GOAL #2   Title  Pt to verbalize understanding with potential A/E needs to increase ease and independence with ADLS (specifically cutting food, writing)    Time  3    Period  Weeks    Status  Achieved      OT SHORT TERM GOAL #3   Title  Pt to be assessed for potential splints to help reduce pain Rt thumb and Lt thumb prn.    Time  3    Period  Weeks    Status  Achieved   04/22/19:  met with L hand, defer with R hand due to upcoming possible surgery       OT Long Term Goals - 04/22/19 1049      OT LONG TERM GOAL #1   Title  Pt to report greater ease with BADLS using A/E and compensatory strategies prn    Time  6    Period  Weeks    Status  On-going      OT LONG TERM GOAL #2   Title  Pt to improve bilateral grip strength by 5 lbs for gripping activities    Time  6    Period  Weeks    Status  Not Met   Grip Strength:  R-19.4, L-20lbs     OT LONG TERM GOAL #3   Title  Pt to perform snack prep/sandwich prep from standing level w/ rest breaks prn safely w/o LOB    Time  6    Period  Weeks    Status  Achieved            Plan - 04/22/19 1035    Clinical Impression Statement  Pt is  progressing towards goals.  However, will defer R hand goals due to receiving further assessment and anticipate surgery R hand. Pt sees Ortho MD today to receive MRI results and discuss options.  Ability to progress HEP/strengthening is limited due to pain and arthritis.    Occupational performance deficits (Please refer to evaluation for details):  ADL's;IADL's;Leisure;Social Participation    Body Structure / Function / Physical Skills  ADL;Dexterity;ROM;IADL;Body mechanics;Mobility;Coordination;FMC;Pain;UE functional use;Decreased knowledge of use of DME;Sensation    Rehab Potential  Good    Comorbidities impacting occupational performance description:  psoriatic arthritis, lumbar radiculitis    OT Frequency  2x / week    OT Duration  6 weeks   or 12 visits over extended weeks   OT Treatment/Interventions  Self-care/ADL training;Therapeutic exercise;Functional Mobility Training;Aquatic Therapy;Neuromuscular education;Manual Therapy;Splinting;Therapeutic activities;Coping strategies training;Energy conservation;DME and/or AE instruction;Passive range  of motion;Patient/family education;Moist Heat    Plan  continue progress towards remaining goals, activity modifications and joint protection, ?d/c next week    Consulted and Agree with Plan of Care  Patient       Patient will benefit from skilled therapeutic intervention in order to improve the following deficits and impairments:   Body Structure / Function / Physical Skills: ADL, Dexterity, ROM, IADL, Body mechanics, Mobility, Coordination, FMC, Pain, UE functional use, Decreased knowledge of use of DME, Sensation       Visit Diagnosis: Other symptoms and signs involving the nervous system  Other lack of coordination  Muscle weakness (generalized)  Pain in right hand  Other symptoms and signs involving the musculoskeletal system    Problem List Patient Active Problem List   Diagnosis Date Noted  . Urinary dysfunction 04/12/2019  .  Osteoarthritis of right knee 08/23/2018  . Constipation due to opioid therapy 03/30/2018  . Retinopathy of both eyes 01/06/2018  . SNHL (sensorineural hearing loss) 12/04/2017  . Abnormal urinary stream 12/03/2017  . Osteoarthritis of carpometacarpal (CMC) joint of thumb 11/30/2017  . Muscle weakness 11/17/2017  . Transient vision disturbance 11/12/2017  . Bilateral hand pain 10/30/2017  . Pain of left hip joint 10/09/2017  . Gynecomastia 07/10/2017  . Iron deficiency anemia 07/05/2017  . Spasticity 05/20/2017  . Lumbar radiculitis 04/20/2017  . Ulnar 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, left 11/28/2016  . Idiopathic peripheral neuropathy 11/28/2016  . 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 Long term (current) use of anticoagulants 08/23/2015  . Right upper quadrant abdominal pain 08/23/2015  . Memory loss 05/10/2015  . Gait abnormality 04/07/2015  . Alkaline phosphatase elevation 04/07/2015  . History of thrombosis 03/26/2015  . Medial epicondylitis 02/07/2015  . Cognitive decline 12/21/2014  . Leukopenia 12/05/2014  . Rotator cuff syndrome of right shoulder 10/27/2014  . Status post left knee replacement 08/22/2014  . Left lateral epicondylitis 08/22/2014  . Chronic cerebral ischemia 08/18/2014  . Arthrofibrosis of knee joint 08/17/2014  . Cubital canal compression syndrome, left 08/17/2014  . 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 Chronic pain syndrome 04/10/2014  . Insomnia 04/10/2014  . Chronic non-specific white matter lesions on MRI 04/10/2014  . CFS (chronic fatigue syndrome) 04/10/2014  . 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 Biceps tendonitis on left 03/01/2014  . Polycythemia vera ([place]) 12/27/2013  . H/O TIA (transient ischemic attack) and stroke 12/27/2013  . Neck pain 12/27/2013  . OSA (obstructive sleep apnea) 12/08/2013  . 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 Depression with anxiety 08/04/2013  . Protein C deficiency ([place]) 08/01/2013  . Post  traumatic stress disorder (PTSD) 08/01/2013  . Speech abnormality 07/25/2013  . Obesity 05/10/2013  . Lower extremity edema 05/10/2013  . GERD (gastroesophageal reflux disease) 05/10/2013  . Arthritis 05/10/2013  . OA (osteoarthritis) of knee 03/15/2013  . Headache 10/25/2012  . Palpitations 10/18/2012  . Fatty liver determined by biopsy 06/01/2012  . Arthropathic psoriasis, unspecified ([place]) 04/29/2012  . Abnormal liver enzymes 03/29/2012  . Psoriatic arthritis ([place]) 12/29/2011  . Left Renal Hydronephrosis 12/11/2010  . 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 Celiac disease 05/27/2010  . Thyroid nodule 05/27/2010  . Male-to-male transgender person 09/20/2002    [facility] 04/22/2019, 11:34 AM  [facility] [address] [place], [place], [address] Phone: [phone]   Fax:  [phone]  Name: Shogo Larkey MRN: [id number] Date of Birth: January 11, 1966   Vianne Bulls, OTR/L [facility]  [place], [place]  [address] [phone] phone [phone] 04/22/19 11:34 AM

## 2019-04-22 NOTE — Therapy (Signed)
[facility] [address] [place], [place], [address] Phone: [phone]   Fax:  [phone]  Physical Therapy Treatment  Patient Details  Name: Jesse Bowers MRN: [id number] Date of Birth: [birth year] Referring Provider (PT): Clerance Lav, MD   Encounter Date: 04/22/2019  PT End of Session - 04/22/19 1204    Visit Number  11    Number of Visits  13    Date for PT Re-Evaluation  05/08/19    Authorization Type  [employer] primary -September 2020-Feb 28th 2021, [facility] secondary (however may lose part B) - 30 visit limit combined PT/OT (Same day counts as 1 visit)    Authorization - Visit Number  14    Authorization - Number of Visits  30    PT Start Time  1106    PT Stop Time  1146    PT Time Calculation (min)  40 min    Equipment Utilized During Treatment  Gait belt    Activity Tolerance  Patient tolerated treatment well;Patient limited by fatigue    Behavior During Therapy  [facility] for tasks assessed/performed   verbose, needs re-direction at times      Past Medical History:  Diagnosis Date  . Abnormal weight loss   . Anxiety   . Arthritis   . Cataract    OU  . Celiac disease   . Cervical neck pain with evidence of disc disease    patient has a cyst   . Chronic constipation   . Chronic diastolic heart failure ([place])    Pt. denies  . Chronic pain   . Degenerative disc disease at L5-S1 level    with stenosis  . DVT (deep venous thrombosis) (HCC)    Right upper arm, bilateral leg  . Eczema    inguinal, feet  . Elevated liver enzymes   . 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 Family history of adverse reaction to anesthesia    family has problems with anesthesia of nausea and vomiting   . 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 GERD (gastroesophageal reflux disease)    History of in 20's  . Gluten enteropathy   . H/O parotitis    right   . Hard of hearing   . History of kidney stones   . History of  retinal tear    Bilateral  . History of staph infection    required wound vac  . Hx-TIA (transient ischemic attack)    2015  . LVH (left ventricular hypertrophy) 12/15/2016   Mild, noted on ECHO  . MVP (mitral valve prolapse)   . NAFL (nonalcoholic fatty liver)   . Neck pain   . Neuromuscular disorder ([place])    bilateral neuropathy feet.  . Pneumonia 12/17/2010  . Polycythemia   . Polycythemia, secondary   . PONV (postoperative nausea and vomiting)   . Protein C deficiency ([place])    Dr. Anne Fu  . Psoriasis    16 X10 cm psoriatic rash on sole of left foot ; open and occ scant bleeding;   . psoriatic arthritis   . PTSD (post-traumatic stress disorder)   . Scaphoid fracture of wrist 09/23/2013  . Seizure ([place])    childhood, medication until age 24 then weanned completely off  . Sleep apnea    split night study last done by Dr. [name] 06/18/15 shows severe OSA, CSA, and hypersomnia, rec bipap  . Splenomegaly   . Stenosis of ureteropelvic junction (UPJ)    left  .  Stroke [facility])    CVA vs TIA in left cerebrum causing slight right sided weakness-Dr. [name] follows  . Syrinx of spinal cord ([place]) 01/06/2014   c spine on MRI  . Tachycardia    hx of   . Transfusion history    past history- none recent, after surgeries due to blood loss  . Wears glasses   . Wears hearing aid     Past Surgical History:  Procedure Laterality Date  . ABDOMINAL HYSTERECTOMY Bilateral 1994   TAH, BSO- tranverse incision at 54 yo  . ANKLE ARTHROSCOPY WITH RECONSTRUCTION Right 2007  . CHOLECYSTECTOMY     laparoscopic  . COLONOSCOPY     x3  . EYE SURGERY     Left eye 03/02/2018, right 02/15/2018  . HIP ARTHROSCOPY W/ LABRAL REPAIR Right 05/11/2013   acetabular labral tear 03/30/2013  . KNEE ARTHROPLASTY Right   . KNEE JOINT MANIPULATION Left    x3 under anesthesia  . KNEE SURGERY Bilateral 1984   Right ACL, left PCL repair  . LITHOTRIPSY  2005  . LIVER BIOPSY  2013   normal results.  [name]  MASTECTOMY Bilateral    prior to 2009  . MOUTH SURGERY    . NASAL SEPTUM SURGERY N/A 09/20/2015   by ENT Dr. [name]  . OVARIAN CYST SURGERY Left    size of grapefruit, was informed that she had shortened vagina  . SHOULDER SURGERY Bilateral    Right 08/15/2016, Left 11/15/2016  . THUMB ARTHROSCOPY Left   . THYROIDECTOMY, PARTIAL Left 2008  . TOTAL KNEE ARTHROPLASTY Right 08/23/2018   Procedure: TOTAL KNEE ARTHROPLASTY;  Surgeon: [name], MD;  Location: [facility];  Service: Orthopedics;  Laterality: Right;  60mn  . TOTAL KNEE REVISION Left 02/06/2016   Procedure: LEFT TOTAL KNEE REVISION;  Surgeon: FGaynelle Arabian MD;  Location: [facility];  Service: Orthopedics;  Laterality: Left;  . TOTAL KNEE REVISION Left 04/22/2017   Procedure: Left knee polyethylene revision;  Surgeon: AGaynelle Arabian MD;  Location: [facility];  Service: Orthopedics;  Laterality: Left;  . UPPER GI ENDOSCOPY  2003    There were no vitals filed for this visit.  Subjective Assessment - 04/22/19 1110    Subjective  No falls. Going to see Dr. OCaralyn Guiletoday for his wrists.    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 PMH: Psoriatic arthritis, Polycythemia vera, CVA, s/p Rt TKR 08/23/18, Lt knee revision 04/2017, lumbar radiculitis, multiple scaphoid fx's Rt thumb, chronic pain    How long can you walk comfortably?  can walk around the house - furniture walking    Diagnostic tests  MRI cervical spine 07/2017: Unchanged C4 through C6 cervical spinal cord syrinx since the MRIin March 2016, and stable by report since October 2015., MRI brain 2019: No acute intracranial abnormality and stable noncontrast MRI appearance of the brain since 2017.    Patient Stated Goals  can't get up without using his hands, wants to be able to get out of his chair without using his hands - less pain in legs and back, be stronger.    Pain Onset  More than a month ago                        [facility] - 04[facility]19 0001      Transfers   Sit to Stand  5: Supervision;With upper extremity assist    Sit to Stand Details  Verbal cues for sequencing;Verbal cues for technique;Verbal cues for precautions/safety    Sit to Stand Details (indicate cue type and reason)  incr B genu valgum and hip IR/ADD    Comments  from mat table > standing      Ambulation/Gait   Ambulation/Gait  Yes    Ambulation/Gait Assistance  4: Min guard    Ambulation/Gait Assistance Details  cues to relax UE, to decr BUE support on RW    Ambulation Distance (Feet)  50 Feet    Assistive device  Rolling walker    Gait Pattern  Step-through pattern;Trunk flexed;Decreased dorsiflexion - left;Decreased dorsiflexion - right;Decreased hip/knee flexion - right;Decreased hip/knee flexion - left;Decreased stance time - left;Right foot flat;Left foot flat    Ambulation Surface  Level;Indoor      Exercises   Exercises  Other Exercises    Other Exercises   verbally reviewed most recent exercises to pt's HEP - supine stretches for BLE (hip flexors, IT band, lower trunk rotations)      Knee/Hip Exercises: Supine   Bridges  5 reps;Strengthening    Bridges Limitations  asking pt to gentle press into therapist's hands for hip ABD activation before lifting into bridge - cues for technique and core activation    Knee Flexion  Strengthening;Right;Left;1 set;10 reps    Knee Flexion Limitations  with red physioball under heel - bringing ball in towards glutes and kicking slowly back out, cues for control, incr difficulty performing with LLE     Other Supine Knee/Hip Exercises  due to incr lumbar lordosis in supine: 1 x 10 reps pelvic tilts with tactile and verbal cues for technique, pt reporting less back pain after performing  - verbally added to pt's HEP    Other Supine Knee/Hip Exercises  with red theraband: 1 x 10 reps B bent knee fall outs with red theraband                 PT Short Term Goals - 04/15/19 0909      PT SHORT TERM GOAL #1   Title  Pt will be independent with initial HEP for improved strength, balance, and gait. ALL STGS DUE 04/06/19    Time  4   due to delay in scheduling   Period  Weeks    Status  Achieved    Target Date  04/06/19      PT SHORT TERM GOAL #2   Title  Patient will undergo further assessment of TUG with use of RW vs. BERG  in order to determine fall risk - goal written as appropriate.    Baseline  performed TUG with RW    Time  4    Period  Weeks    Status  Achieved      PT SHORT TERM GOAL #3   Title  Patient will verbalize understanding of fall prevention strategies in the home.    Time  4    Period  Weeks    Status  Deferred      PT SHORT TERM GOAL #4   Title  Patient will ambulate at least 9' with RW with supervision in order to safely ambulate at home.    Baseline  pt has ambulated 115' in clinic with min guard    Time  4    Period  Weeks    Status  Partially Met      PT SHORT TERM GOAL #5   Title  Patient will perform 4 steps with supervision using  single railing and step to pattern in order to increase safety at home with stairs.    Baseline  4 steps with supervision/min guard with BUE support on railings and step to pattern.    Time  4    Period  Weeks    Status  Not Met      PT SHORT TERM GOAL #6   Title  Perform 5x sit to stand from standard arm chair vs. mat table and write goal as appropriate.    Baseline  met on 04/08/19    Time  4    Period  Weeks    Status  Achieved        PT Long Term Goals - 04/08/19 1434      PT LONG TERM GOAL #1   Title  Pt will be independent with final HEP for land/aquatic therapy for improved strength, ROM, balance, and gait. ALL LTGS DUE 05/04/19    Time  8   due to delay in scheduling   Period  Weeks    Status  New      PT LONG TERM GOAL #2   Title  Pt will decr TUG score with RW to 24 seconds or less with RW to decr fall risk.    Baseline   29.38 seconds with RW    Time  8    Period  Weeks    Status  New      PT LONG TERM GOAL #3   Title  Patient will ambulate at least 150' with RW with mod I in order to safely ambulate at home.    Baseline  not yet assessed.    Time  8    Period  Weeks    Status  Revised      PT LONG TERM GOAL #4   Title  Patient will perform 12 steps with supervision using single railing and step to pattern in order to increase safety at home with stairs.    Baseline  not yet assessed.    Time  8    Period  Weeks    Status  New      PT LONG TERM GOAL #5   Title  Patient will perform bed mobility, stap step transfers with RW with mod I in order to decrease caregiver burden.    Baseline  not yet assessed.    Time  8    Period  Weeks    Status  New      PT LONG TERM GOAL #6   Title  Pt will decr 5x sit <> stand time from 36 seconds to 30 seconds or less from mat table using BUE support to demo improved functional LE strength.    Time  8    Period  Weeks    Status  New            Plan - 04/22/19 1206    Clinical Impression Statement  Focus of today's skilled session was supine LE strengthening, verbally reviewing pt's HEP for LE stretches, and gait with RW. Limited distance with gait today due to pt reporting incr fatigue in BLE. Pt reporting less back pain after performing supine pelvic tilts with verbal and tactile cues. Will continue to progress towards LTGs.    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 PMH: Psoriatic arthritis, Polycythemia vera, CVA, s/p Rt TKR 08/23/18, Lt knee revision 04/2017, lumbar radiculitis,  multiple scaphoid fx's Rt thumb, chronic pain    Examination-Activity Limitations  Transfers;Stairs;Locomotion Level;Stand    Examination-Participation Restrictions  Community Activity    Stability/Clinical Decision Making  Evolving/Moderate complexity    Rehab Potential  Good     PT Frequency  2x / week    PT Duration  6 weeks    PT Treatment/Interventions  ADLs/Self Care Home Management;Neuromuscular re-education;Therapeutic activities;Therapeutic exercise;Patient/family education;Manual techniques;Scar mobilization;Passive range of motion;Manual lymph drainage;Electrical Stimulation;Aquatic Therapy;Energy conservation;Gait training;Stair training;Functional mobility training;DME Instruction;Balance training;Orthotic Fit/Training    PT Next Visit Plan  fall prevention strategies in the home. continue LE strengthening, gait training (walking around obstacles),  standing balance in // bars, fall prevention strategy handout. Nustep for strengthening/endurance. core strengthening    PT Home Exercise Plan  [id number]    Consulted and Agree with Plan of Care  Patient       Patient will benefit from skilled therapeutic intervention in order to improve the following deficits and impairments:  Decreased range of motion, Decreased endurance, Decreased activity tolerance, Abnormal gait, Difficulty walking, Decreased coordination, Decreased balance, Decreased strength, Pain, Decreased mobility, Decreased safety awareness  Visit Diagnosis: Other symptoms and signs involving the nervous system  Other lack of coordination  Muscle weakness (generalized)  Other symptoms and signs involving the musculoskeletal system  Unsteadiness on feet     Problem List Patient Active Problem List   Diagnosis Date Noted  . Urinary dysfunction 04/12/2019  . Osteoarthritis of right knee 08/23/2018  . Constipation due to opioid therapy 03/30/2018  . Retinopathy of both eyes 01/06/2018  . SNHL (sensorineural hearing loss) 12/04/2017  . Abnormal urinary stream 12/03/2017  . Osteoarthritis of carpometacarpal (CMC) joint of thumb 11/30/2017  . Muscle weakness 11/17/2017  . Transient vision disturbance 11/12/2017  . Bilateral hand pain 10/30/2017  . Pain of left hip joint 10/09/2017  .  Gynecomastia 07/10/2017  . Iron deficiency anemia 07/05/2017  . Spasticity 05/20/2017  . Lumbar radiculitis 04/20/2017  . Ulnar 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, left 11/28/2016  . Idiopathic peripheral neuropathy 11/28/2016  . 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 Long term (current) use of anticoagulants 08/23/2015  . Right upper quadrant abdominal pain 08/23/2015  . Memory loss 05/10/2015  . Gait abnormality 04/07/2015  . Alkaline phosphatase elevation 04/07/2015  . History of thrombosis 03/26/2015  . Medial epicondylitis 02/07/2015  . Cognitive decline 12/21/2014  . Leukopenia 12/05/2014  . Rotator cuff syndrome of right shoulder 10/27/2014  . Status post left knee replacement 08/22/2014  . Left lateral epicondylitis 08/22/2014  . Chronic cerebral ischemia 08/18/2014  . Arthrofibrosis of knee joint 08/17/2014  . Cubital canal compression syndrome, left 08/17/2014  . 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 Chronic pain syndrome 04/10/2014  . Insomnia 04/10/2014  . Chronic non-specific white matter lesions on MRI 04/10/2014  . CFS (chronic fatigue syndrome) 04/10/2014  . 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 Biceps tendonitis on left 03/01/2014  . Polycythemia vera ([place]) 12/27/2013  . H/O TIA (transient ischemic attack) and stroke 12/27/2013  . Neck pain 12/27/2013  . OSA (obstructive sleep apnea) 12/08/2013  . 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 Depression with anxiety 08/04/2013  . Protein C deficiency ([name]) 08/01/2013  . Post traumatic stress disorder (PTSD) 08/01/2013  . Speech abnormality 07/25/2013  . Obesity 05/10/2013  . Lower extremity edema 05/10/2013  . GERD (gastroesophageal reflux disease) 05/10/2013  . Arthritis 05/10/2013  . OA (osteoarthritis) of knee 03/15/2013  . Headache 10/25/2012  . Palpitations 10/18/2012  .  Fatty liver determined by biopsy 06/01/2012  . Arthropathic psoriasis, unspecified ([place]) 04/29/2012  . Abnormal liver enzymes  03/29/2012  . Psoriatic arthritis ([place]) 12/29/2011  . Left Renal Hydronephrosis 12/11/2010  . 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 Celiac disease 05/27/2010  . Thyroid nodule 05/27/2010  . Male-to-male transgender person 09/20/2002    Arliss Journey , PT, DPT  04/22/2019, 12:13 PM  [facility] [address] [place], [place], [address] Phone: [phone]   Fax:  [phone]  Name: Jesse Bowers MRN: [id number] Date of Birth: June 16, 1965

## 2019-04-26 ENCOUNTER — Ambulatory Visit (INDEPENDENT_AMBULATORY_CARE_PROVIDER_SITE_OTHER): Payer: BC Managed Care – PPO | Admitting: Psychology

## 2019-04-26 DIAGNOSIS — F4312 Post-traumatic stress disorder, chronic: Secondary | ICD-10-CM

## 2019-04-27 ENCOUNTER — Telehealth (INDEPENDENT_AMBULATORY_CARE_PROVIDER_SITE_OTHER): Payer: BC Managed Care – PPO | Admitting: Urology

## 2019-04-27 ENCOUNTER — Ambulatory Visit: Payer: BC Managed Care – PPO | Admitting: Occupational Therapy

## 2019-04-27 ENCOUNTER — Encounter: Payer: Self-pay | Admitting: Neurology

## 2019-04-27 ENCOUNTER — Other Ambulatory Visit: Payer: Self-pay

## 2019-04-27 ENCOUNTER — Encounter: Payer: Self-pay | Admitting: Occupational Therapy

## 2019-04-27 ENCOUNTER — Encounter: Payer: Self-pay | Admitting: Cardiovascular Disease

## 2019-04-27 ENCOUNTER — Ambulatory Visit: Payer: BC Managed Care – PPO | Admitting: Physical Therapy

## 2019-04-27 ENCOUNTER — Ambulatory Visit: Payer: BC Managed Care – PPO | Admitting: Cardiovascular Disease

## 2019-04-27 VITALS — BP 120/69 | HR 79 | Temp 96.8°F | Ht 69.0 in | Wt 208.6 lb

## 2019-04-27 DIAGNOSIS — R278 Other lack of coordination: Secondary | ICD-10-CM

## 2019-04-27 DIAGNOSIS — I5032 Chronic diastolic (congestive) heart failure: Secondary | ICD-10-CM

## 2019-04-27 DIAGNOSIS — D6859 Other primary thrombophilia: Secondary | ICD-10-CM

## 2019-04-27 DIAGNOSIS — M6281 Muscle weakness (generalized): Secondary | ICD-10-CM

## 2019-04-27 DIAGNOSIS — R29898 Other symptoms and signs involving the musculoskeletal system: Secondary | ICD-10-CM

## 2019-04-27 DIAGNOSIS — Z7901 Long term (current) use of anticoagulants: Secondary | ICD-10-CM

## 2019-04-27 DIAGNOSIS — Z87448 Personal history of other diseases of urinary system: Secondary | ICD-10-CM | POA: Diagnosis not present

## 2019-04-27 DIAGNOSIS — G4733 Obstructive sleep apnea (adult) (pediatric): Secondary | ICD-10-CM | POA: Diagnosis not present

## 2019-04-27 DIAGNOSIS — N3281 Overactive bladder: Secondary | ICD-10-CM

## 2019-04-27 DIAGNOSIS — R2689 Other abnormalities of gait and mobility: Secondary | ICD-10-CM | POA: Diagnosis not present

## 2019-04-27 DIAGNOSIS — R2681 Unsteadiness on feet: Secondary | ICD-10-CM

## 2019-04-27 DIAGNOSIS — Q6239 Other obstructive defects of renal pelvis and ureter: Secondary | ICD-10-CM | POA: Diagnosis not present

## 2019-04-27 DIAGNOSIS — R29818 Other symptoms and signs involving the nervous system: Secondary | ICD-10-CM

## 2019-04-27 NOTE — Patient Instructions (Signed)
Medication Instructions:  No changes *If you need a refill on your cardiac medications before your next appointment, please call your pharmacy*  Lab Work: None ordered If you have labs (blood work) drawn today and your tests are completely normal, you will receive your results only by: [name] MyChart Message (if you have MyChart) OR . A paper copy in the mail If you have any lab test that is abnormal or we need to change your treatment, we will call you to review the results.  Testing/Procedures: Your physician has requested that you have an echocardiogram. Echocardiography is a painless test that uses sound waves to create images of your heart. It provides your doctor with information about the size and shape of your heart and how well your heart's chambers and valves are working. You may receive an ultrasound enhancing agent through an IV if needed to better visualize your heart during the echo.This procedure takes approximately one hour. There are no restrictions for this procedure. This will take place at the [address].    Follow-Up: At [facility], you and your health needs are our priority.  As part of our continuing mission to provide you with exceptional heart care, we have created designated Provider Care Teams.  These Care Teams include your primary Cardiologist (physician) and Advanced Practice Providers (APPs -  Physician Assistants and Nurse Practitioners) who all work together to provide you with the care you need, when you need it.  Your next appointment:   6 month(s)  The format for your next appointment:   In Person  Provider:   You may see Sanda Klein, MD or one of the following Advanced Practice Providers on your designated Care Team:    Almyra Deforest, PA-C  Fabian Sharp, [place] or   Roby Lofts, [place]   Other Instructions A referral has been made to Dr. Claiborne Billings for the sleep clinic

## 2019-04-27 NOTE — Therapy (Signed)
[facility] [address] [place], [place], [address] Phone: [phone]   Fax:  [phone]  Physical Therapy Treatment  Patient Details  Name: Jesse Bowers MRN: [id number] Date of Birth: 1965/09/11 Referring Provider (PT): Clerance Lav, MD   Encounter Date: 04/27/2019  PT End of Session - 04/27/19 1847    Visit Number  12    Number of Visits  13    Date for PT Re-Evaluation  05/08/19    Authorization Type  [employer] primary -September 2020-Feb 28th 2021, [facility] secondary (however may lose part B) - 30 visit limit combined PT/OT (Same day counts as 1 visit)    Authorization - Visit Number  15    Authorization - Number of Visits  30    PT Start Time  1318    PT Stop Time  1400    PT Time Calculation (min)  42 min    Equipment Utilized During Treatment  Gait belt    Activity Tolerance  Patient tolerated treatment well    Behavior During Therapy  WFL for tasks assessed/performed   verbose, needs re-direction at times      Past Medical History:  Diagnosis Date  . Abnormal weight loss   . Anxiety   . Arthritis   . Cataract    OU  . Celiac disease   . Cervical neck pain with evidence of disc disease    patient has a cyst   . Chronic constipation   . Chronic diastolic heart failure ([place])    Pt. denies  . Chronic pain   . Degenerative disc disease at L5-S1 level    with stenosis  . DVT (deep venous thrombosis) (HCC)    Right upper arm, bilateral leg  . Eczema    inguinal, feet  . Elevated liver enzymes   . 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 Family history of adverse reaction to anesthesia    family has problems with anesthesia of nausea and vomiting   . 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 GERD (gastroesophageal reflux disease)    History of in 20's  . Gluten enteropathy   . H/O parotitis    right   . Hard of hearing   . History of kidney stones   . History of retinal tear    Bilateral  .  History of staph infection    required wound vac  . Hx-TIA (transient ischemic attack)    2015  . LVH (left ventricular hypertrophy) 12/15/2016   Mild, noted on ECHO  . MVP (mitral valve prolapse)   . NAFL (nonalcoholic fatty liver)   . Neck pain   . Neuromuscular disorder ([place])    bilateral neuropathy feet.  . Pneumonia 12/17/2010  . Polycythemia   . Polycythemia, secondary   . PONV (postoperative nausea and vomiting)   . Protein C deficiency ([place])    Dr. Anne Fu  . Psoriasis    16 X10 cm psoriatic rash on sole of left foot ; open and occ scant bleeding;   . psoriatic arthritis   . PTSD (post-traumatic stress disorder)   . Scaphoid fracture of wrist 09/23/2013  . Seizure ([place])    childhood, medication until age 28 then weanned completely off  . Sleep apnea    split night study last done by Dr. [name] 06/18/15 shows severe OSA, CSA, and hypersomnia, rec bipap  . Splenomegaly   . Stenosis of ureteropelvic junction (UPJ)    left  . Stroke [facility])  CVA vs TIA in left cerebrum causing slight right sided weakness-Dr. [name] follows  . Syrinx of spinal cord ([place]) 01/06/2014   c spine on MRI  . Tachycardia    hx of   . Transfusion history    past history- none recent, after surgeries due to blood loss  . Wears glasses   . Wears hearing aid     Past Surgical History:  Procedure Laterality Date  . ABDOMINAL HYSTERECTOMY Bilateral 1994   TAH, BSO- tranverse incision at 54 yo  . ANKLE ARTHROSCOPY WITH RECONSTRUCTION Right 2007  . CHOLECYSTECTOMY     laparoscopic  . COLONOSCOPY     x3  . EYE SURGERY     Left eye 03/02/2018, right 02/15/2018  . HIP ARTHROSCOPY W/ LABRAL REPAIR Right 05/11/2013   acetabular labral tear 03/30/2013  . KNEE ARTHROPLASTY Right   . KNEE JOINT MANIPULATION Left    x3 under anesthesia  . KNEE SURGERY Bilateral 1984   Right ACL, left PCL repair  . LITHOTRIPSY  2005  . LIVER BIOPSY  2013   normal results.  [name] MASTECTOMY Bilateral    prior to  2009  . MOUTH SURGERY    . NASAL SEPTUM SURGERY N/A 09/20/2015   by ENT Dr. [name]  . OVARIAN CYST SURGERY Left    size of grapefruit, was informed that she had shortened vagina  . SHOULDER SURGERY Bilateral    Right 08/15/2016, Left 11/15/2016  . THUMB ARTHROSCOPY Left   . THYROIDECTOMY, PARTIAL Left 2008  . TOTAL KNEE ARTHROPLASTY Right 08/23/2018   Procedure: TOTAL KNEE ARTHROPLASTY;  Surgeon: [name], MD;  Location: [facility];  Service: Orthopedics;  Laterality: Right;  24mn  . TOTAL KNEE REVISION Left 02/06/2016   Procedure: LEFT TOTAL KNEE REVISION;  Surgeon: FGaynelle Arabian MD;  Location: [facility];  Service: Orthopedics;  Laterality: Left;  . TOTAL KNEE REVISION Left 04/22/2017   Procedure: Left knee polyethylene revision;  Surgeon: AGaynelle Arabian MD;  Location: [facility];  Service: Orthopedics;  Laterality: Left;  . UPPER GI ENDOSCOPY  2003    There were no vitals filed for this visit.  Subjective Assessment - 04/27/19 1325    Subjective  Going to be having surgery for his right wrist in March.    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 PMH: Psoriatic arthritis, Polycythemia vera, CVA, s/p Rt TKR 08/23/18, Lt knee revision 04/2017, lumbar radiculitis, multiple scaphoid fx's Rt thumb, chronic pain    How long can you walk comfortably?  can walk around the house - furniture walking    Diagnostic tests  MRI cervical spine 07/2017: Unchanged C4 through C6 cervical spinal cord syrinx since the MRIin March 2016, and stable by report since October 2015., MRI brain 2019: No acute intracranial abnormality and stable noncontrast MRI appearance of the brain since 2017.    Patient Stated Goals  can't get up without using his hands, wants to be able to get out of his chair without using his hands - less pain in legs and back, be stronger.    Pain Onset  More than a month ago                       [facility]ercise -  04/27/19 1349      Transfers   Sit to Stand  5: Supervision;With upper extremity assist    Sit to Stand Details (indicate cue type and reason)  cues for foot placement. discused proper technique for sit <> stand transfers to decr stress on R wrist - using LUE on mat table and RUE support on RW - x5 reps    Stand to Sit  5: Supervision;With upper extremity assist    Comments  2 x 8 reps from mat table with bolster for incr quad activation, need for single LUE support pushing off from bolster and RUE on RW, needed rest break between reps due to fatigue      Ambulation/Gait   Ambulation/Gait  Yes    Ambulation/Gait Assistance  4: Min guard    Ambulation/Gait Assistance Details  cues for posture and to relax shoulders on UpWalker. ambulated with UpWalker today to decr pressure on wrists. needed seated rest break between each bout of gait     Ambulation Distance (Feet)  115 Feet   x2   Assistive device  Other (Comment)   UpWalker   Gait Pattern  Step-through pattern;Trunk flexed;Decreased dorsiflexion - left;Decreased dorsiflexion - right;Decreased hip/knee flexion - right;Decreased hip/knee flexion - left;Decreased stance time - left;Right foot flat;Left foot flat    Ambulation Surface  Level;Indoor    Gait Comments  discussed with pt importance of purchasing wrist grips for home use for RW to decr pressure through wrists (pt's physician also reported this was a good idea prior to surgery) - will be purchasing at the end of this week      Therapeutic Activites    Therapeutic Activities  Other Therapeutic Activities    Other Therapeutic Activities  pt undergoing R wrist surgery in the next couple of weeks - discussed POC going forward and putting pt on hold until after he has his surgery - to continue with PT in clinic and for aquatics, pt verbalized understanding                PT Short Term Goals - 04/15/19 0909      PT SHORT TERM GOAL #1   Title  Pt will be independent with initial  HEP for improved strength, balance, and gait. ALL STGS DUE 04/06/19    Time  4   due to delay in scheduling   Period  Weeks    Status  Achieved    Target Date  04/06/19      PT SHORT TERM GOAL #2   Title  Patient will undergo further assessment of TUG with use of RW vs. BERG  in order to determine fall risk - goal written as appropriate.    Baseline  performed TUG with RW    Time  4    Period  Weeks    Status  Achieved      PT SHORT TERM GOAL #3   Title  Patient will verbalize understanding of fall prevention strategies in the home.    Time  4    Period  Weeks    Status  Deferred      PT SHORT TERM GOAL #4   Title  Patient will ambulate at least 104' with RW with supervision in order to safely ambulate at home.    Baseline  pt has ambulated 115' in clinic with min guard    Time  4    Period  Weeks    Status  Partially Met      PT SHORT TERM GOAL #5   Title  Patient will perform 4 steps with supervision using single railing and step to pattern in order to increase safety at home with  stairs.    Baseline  4 steps with supervision/min guard with BUE support on railings and step to pattern.    Time  4    Period  Weeks    Status  Not Met      PT SHORT TERM GOAL #6   Title  Perform 5x sit to stand from standard arm chair vs. mat table and write goal as appropriate.    Baseline  met on 04/08/19    Time  4    Period  Weeks    Status  Achieved        PT Long Term Goals - 04/08/19 1434      PT LONG TERM GOAL #1   Title  Pt will be independent with final HEP for land/aquatic therapy for improved strength, ROM, balance, and gait. ALL LTGS DUE 05/04/19    Time  8   due to delay in scheduling   Period  Weeks    Status  New      PT LONG TERM GOAL #2   Title  Pt will decr TUG score with RW to 24 seconds or less with RW to decr fall risk.    Baseline  29.38 seconds with RW    Time  8    Period  Weeks    Status  New      PT LONG TERM GOAL #3   Title  Patient will ambulate at least  150' with RW with mod I in order to safely ambulate at home.    Baseline  not yet assessed.    Time  8    Period  Weeks    Status  Revised      PT LONG TERM GOAL #4   Title  Patient will perform 12 steps with supervision using single railing and step to pattern in order to increase safety at home with stairs.    Baseline  not yet assessed.    Time  8    Period  Weeks    Status  New      PT LONG TERM GOAL #5   Title  Patient will perform bed mobility, stap step transfers with RW with mod I in order to decrease caregiver burden.    Baseline  not yet assessed.    Time  8    Period  Weeks    Status  New      PT LONG TERM GOAL #6   Title  Pt will decr 5x sit <> stand time from 36 seconds to 30 seconds or less from mat table using BUE support to demo improved functional LE strength.    Time  8    Period  Weeks    Status  New            Plan - 04/27/19 1849    Clinical Impression Statement  Pt will be having surgery for R wrist in the next couple of months in March - discussed continuing POC and aquatic therapy after pt has surgery, pt will let therapist know as soon as possible when the surgery dtae is. Focus of today's skilled session was gait training with UpWalker to decr pressure on wrists and sit <> stand training. Pt tolerated session well - but does fatigue easily. Will continue to progress towards LTGs.    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 PMH: Psoriatic arthritis, Polycythemia vera, CVA, s/p Rt  TKR 08/23/18, Lt knee revision 04/2017, lumbar radiculitis, multiple scaphoid fx's Rt thumb, chronic pain    Examination-Activity Limitations  Transfers;Stairs;Locomotion Level;Stand    Examination-Participation Restrictions  Community Activity    Stability/Clinical Decision Making  Evolving/Moderate complexity    Rehab Potential  Good    PT Frequency  2x / week    PT Duration  6  weeks    PT Treatment/Interventions  ADLs/Self Care Home Management;Neuromuscular re-education;Therapeutic activities;Therapeutic exercise;Patient/family education;Manual techniques;Scar mobilization;Passive range of motion;Manual lymph drainage;Electrical Stimulation;Aquatic Therapy;Energy conservation;Gait training;Stair training;Functional mobility training;DME Instruction;Balance training;Orthotic Fit/Training    PT Next Visit Plan  check goals? put pt on hold? fall prevention strategies in the home. continue LE strengthening, gait training (walking around obstacles),  standing balance in // bars, fall prevention strategy handout. Nustep for strengthening/endurance. core strengthening    PT Home Exercise Plan  [id number]    Consulted and Agree with Plan of Care  Patient       Patient will benefit from skilled therapeutic intervention in order to improve the following deficits and impairments:  Decreased range of motion, Decreased endurance, Decreased activity tolerance, Abnormal gait, Difficulty walking, Decreased coordination, Decreased balance, Decreased strength, Pain, Decreased mobility, Decreased safety awareness  Visit Diagnosis: Other lack of coordination  Other symptoms and signs involving the nervous system  Muscle weakness (generalized)  Other symptoms and signs involving the musculoskeletal system  Unsteadiness on feet  Other abnormalities of gait and mobility     Problem List Patient Active Problem List   Diagnosis Date Noted  . Urinary dysfunction 04/12/2019  . Osteoarthritis of right knee 08/23/2018  . Constipation due to opioid therapy 03/30/2018  . Retinopathy of both eyes 01/06/2018  . SNHL (sensorineural hearing loss) 12/04/2017  . Abnormal urinary stream 12/03/2017  . Osteoarthritis of carpometacarpal (CMC) joint of thumb 11/30/2017  . Muscle weakness 11/17/2017  . Transient vision disturbance 11/12/2017  . Bilateral hand pain 10/30/2017  . Pain of left hip  joint 10/09/2017  . Gynecomastia 07/10/2017  . Iron deficiency anemia 07/05/2017  . Spasticity 05/20/2017  . Lumbar radiculitis 04/20/2017  . Ulnar 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, left 11/28/2016  . Idiopathic peripheral neuropathy 11/28/2016  . 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 Long term (current) use of anticoagulants 08/23/2015  . Right upper quadrant abdominal pain 08/23/2015  . Memory loss 05/10/2015  . Gait abnormality 04/07/2015  . Alkaline phosphatase elevation 04/07/2015  . History of thrombosis 03/26/2015  . Medial epicondylitis 02/07/2015  . Cognitive decline 12/21/2014  . Leukopenia 12/05/2014  . Rotator cuff syndrome of right shoulder 10/27/2014  . Status post left knee replacement 08/22/2014  . Left lateral epicondylitis 08/22/2014  . Chronic cerebral ischemia 08/18/2014  . Arthrofibrosis of knee joint 08/17/2014  . Cubital canal compression syndrome, left 08/17/2014  . 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 Chronic pain syndrome 04/10/2014  . Insomnia 04/10/2014  . Chronic non-specific white matter lesions on MRI 04/10/2014  . CFS (chronic fatigue syndrome) 04/10/2014  . 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 Biceps tendonitis on left 03/01/2014  . Polycythemia vera ([place]) 12/27/2013  . H/O TIA (transient ischemic attack) and stroke 12/27/2013  . Neck pain 12/27/2013  . OSA (obstructive sleep apnea) 12/08/2013  . 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 Depression with anxiety 08/04/2013  . Protein C deficiency ([place]) 08/01/2013  . Post traumatic stress disorder (PTSD) 08/01/2013  . Speech abnormality 07/25/2013  . Obesity 05/10/2013  . Lower extremity edema 05/10/2013  . GERD (gastroesophageal reflux disease)  05/10/2013  . Arthritis 05/10/2013  . OA (osteoarthritis) of knee 03/15/2013  . Headache 10/25/2012  . Palpitations 10/18/2012  . Fatty liver determined by biopsy 06/01/2012  . Arthropathic psoriasis, unspecified ([place]) 04/29/2012  .  Abnormal liver enzymes 03/29/2012  . Psoriatic arthritis ([place]) 12/29/2011  . Left Renal Hydronephrosis 12/11/2010  . 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 Celiac disease 05/27/2010  . Thyroid nodule 05/27/2010  . Male-to-male transgender person 09/20/2002    Arliss Journey, PT, DPT  04/27/2019, 6:54 PM  [facility] [address] [place], [place], [address] Phone: [phone]   Fax:  [phone]  Name: Kobe Ofallon MRN: [id number] Date of Birth: Dec 19, 1965

## 2019-04-27 NOTE — Therapy (Signed)
[facility] [address] [place], [place], [address] Phone: [phone]   Fax:  [phone]  Occupational Therapy Treatment  Patient Details  Name: Jesse Bowers MRN: [id number] Date of Birth: [birth year] Referring Provider (OT): Dr. [name]   Encounter Date: 04/27/2019  OT End of Session - 04/27/19 1435    Visit Number  9    Number of Visits  13    Date for OT Re-Evaluation  04/23/19    Authorization Type  BC/BS primary, MCR secondary (however may lose part B) - 30 visit limit combined PT/OT (Same day counts as 1 visit)    Authorization Time Period  Plan goes from 11/2018-05/01/19    Authorization - Visit Number  9    Authorization - Number of Visits  15    OT Start Time  6283    OT Stop Time  1315    OT Time Calculation (min)  40 min    Activity Tolerance  Patient tolerated treatment well    Behavior During Therapy  [facility] for tasks assessed/performed       Past Medical History:  Diagnosis Date  . Abnormal weight loss   . Anxiety   . Arthritis   . Cataract    OU  . Celiac disease   . Cervical neck pain with evidence of disc disease    patient has a cyst   . Chronic constipation   . Chronic diastolic heart failure ([place])    Pt. denies  . Chronic pain   . Degenerative disc disease at L5-S1 level    with stenosis  . DVT (deep venous thrombosis) (HCC)    Right upper arm, bilateral leg  . Eczema    inguinal, feet  . Elevated liver enzymes   . 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 Family history of adverse reaction to anesthesia    family has problems with anesthesia of nausea and vomiting   . 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 GERD (gastroesophageal reflux disease)    History of in 20's  . Gluten enteropathy   . H/O parotitis    right   . Hard of hearing   . History of kidney stones   . History of retinal tear    Bilateral  . History of staph infection    required wound vac  . Hx-TIA  (transient ischemic attack)    2015  . LVH (left ventricular hypertrophy) 12/15/2016   Mild, noted on ECHO  . MVP (mitral valve prolapse)   . NAFL (nonalcoholic fatty liver)   . Neck pain   . Neuromuscular disorder ([place])    bilateral neuropathy feet.  . Pneumonia 12/17/2010  . Polycythemia   . Polycythemia, secondary   . PONV (postoperative nausea and vomiting)   . Protein C deficiency ([place])    Dr. Anne Fu  . Psoriasis    16 X10 cm psoriatic rash on sole of left foot ; open and occ scant bleeding;   . psoriatic arthritis   . PTSD (post-traumatic stress disorder)   . Scaphoid fracture of wrist 09/23/2013  . Seizure ([place])    childhood, medication until age 55 then weanned completely off  . Sleep apnea    split night study last done by Dr. [name] 06/18/15 shows severe OSA, CSA, and hypersomnia, rec bipap  . Splenomegaly   . Stenosis of ureteropelvic junction (UPJ)    left  . Stroke [facility])    CVA vs TIA in left cerebrum causing  slight right sided weakness-Dr. [name] follows  . Syrinx of spinal cord ([place]) 01/06/2014   c spine on MRI  . Tachycardia    hx of   . Transfusion history    past history- none recent, after surgeries due to blood loss  . Wears glasses   . Wears hearing aid     Past Surgical History:  Procedure Laterality Date  . ABDOMINAL HYSTERECTOMY Bilateral 1994   TAH, BSO- tranverse incision at 54 yo  . ANKLE ARTHROSCOPY WITH RECONSTRUCTION Right 2007  . CHOLECYSTECTOMY     laparoscopic  . COLONOSCOPY     x3  . EYE SURGERY     Left eye 03/02/2018, right 02/15/2018  . HIP ARTHROSCOPY W/ LABRAL REPAIR Right 05/11/2013   acetabular labral tear 03/30/2013  . KNEE ARTHROPLASTY Right   . KNEE JOINT MANIPULATION Left    x3 under anesthesia  . KNEE SURGERY Bilateral 1984   Right ACL, left PCL repair  . LITHOTRIPSY  2005  . LIVER BIOPSY  2013   normal results.  [name] MASTECTOMY Bilateral    prior to 2009  . MOUTH SURGERY    . NASAL SEPTUM SURGERY N/A  09/20/2015   by ENT Dr. [name]  . OVARIAN CYST SURGERY Left    size of grapefruit, was informed that she had shortened vagina  . SHOULDER SURGERY Bilateral    Right 08/15/2016, Left 11/15/2016  . THUMB ARTHROSCOPY Left   . THYROIDECTOMY, PARTIAL Left 2008  . TOTAL KNEE ARTHROPLASTY Right 08/23/2018   Procedure: TOTAL KNEE ARTHROPLASTY;  Surgeon: [name], MD;  Location: [facility];  Service: Orthopedics;  Laterality: Right;  20mn  . TOTAL KNEE REVISION Left 02/06/2016   Procedure: LEFT TOTAL KNEE REVISION;  Surgeon: FGaynelle Arabian MD;  Location: [facility];  Service: Orthopedics;  Laterality: Left;  . TOTAL KNEE REVISION Left 04/22/2017   Procedure: Left knee polyethylene revision;  Surgeon: AGaynelle Arabian MD;  Location: [facility];  Service: Orthopedics;  Laterality: Left;  . UPPER GI ENDOSCOPY  2003    There were no vitals filed for this visit.  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 PMH: Psoriatic arthritis, CVA, s/p Rt TKR 08/23/18, Lt knee revision 04/2017, lumbar radiculitis, multiple scaphoid fx's Rt thumb    Limitations  fall risk    Patient Stated Goals  cut my food, holding fork and pen/pencil better w/ less pain in Rt hand    Pain Score  6     Pain Location  --   R thumb   Pain Orientation  Right    Pain Descriptors / Indicators  Sharp    Pain Type  Chronic pain    Pain Onset  More than a month ago    Pain Frequency  Constant    Aggravating Factors   pinching    Pain Relieving Factors  rest        Checked remaining goals and discussed progress and upcoming R hand surgery.  Reviewed importance of continuing with L hand putty HEP, theraband HEP with mirror for cueing for proper positioning and to avoid overdoing it, observe pain as  guide (if activity is painful, pt should avoid it or modify it).  Discussed modifications to holding barbell in aquatic therapy (holding  avoiding thumb).  Pt verbalized understanding.       OT Education - 04/27/19 1439    Education Details  Joint protection techniques/activity modifications    Person(s) Educated  Patient    Methods  Explanation;Demonstration;Handout    Comprehension  Verbalized understanding       OT Short Term Goals - 04/27/19 1250      OT SHORT TERM GOAL #1   Title  Independent with UE strengthening HEP (shoulders and hands)    Time  3    Period  Weeks    Status  Achieved   04/22/19:  limited/unable with R hand due to pain/pending surgery, met with shoulder HEP and L hand HEP     OT SHORT TERM GOAL #2   Title  Pt to verbalize understanding with potential A/E needs to increase ease and independence with ADLS (specifically cutting food, writing)    Time  3    Period  Weeks    Status  Achieved      OT SHORT TERM GOAL #3   Title  Pt to be assessed for potential splints to help reduce pain Rt thumb and Lt thumb prn.    Time  3    Period  Weeks    Status  Achieved   04/22/19:  met with L hand, defer with R hand due to upcoming surgery       OT Long Term Goals - 04/27/19 1251      OT LONG TERM GOAL #1   Title  Pt to report greater ease with BADLS using A/E and compensatory strategies prn    Time  6    Period  Weeks    Status  Achieved      OT LONG TERM GOAL #2   Title  Pt to improve bilateral grip strength by 5 lbs for gripping activities    Time  6    Period  Weeks    Status  Not Met   Grip Strength:  R-19.4, L-20lbs     OT LONG TERM GOAL #3   Title  Pt to perform snack prep/sandwich prep from standing level w/ rest breaks prn safely w/o LOB    Time  6    Period  Weeks    Status  Achieved            Plan - 04/27/19 1436    Clinical Impression Statement  Deferred goals related to R hand strength as pt is pending surgery.  Pt able to  continue with HEP and activity modifications and is appropriate for d/c at this time.    Occupational performance deficits (Please refer to evaluation for details):  ADL's;IADL's;Leisure;Social Participation    Body Structure / Function / Physical Skills  ADL;Dexterity;ROM;IADL;Body mechanics;Mobility;Coordination;FMC;Pain;UE functional use;Decreased knowledge of use of DME;Sensation    Rehab Potential  Good    Comorbidities impacting occupational performance description:  psoriatic arthritis, lumbar radiculitis    OT Frequency  2x / week    OT Duration  6 weeks   or 12 visits over extended weeks   OT Treatment/Interventions  Self-care/ADL training;Therapeutic exercise;Functional Mobility Training;Aquatic Therapy;Neuromuscular education;Manual Therapy;Splinting;Therapeutic activities;Coping strategies training;Energy conservation;DME and/or AE instruction;Passive range of motion;Patient/family education;Moist Heat    Plan  d/c OT    Consulted and Agree with Plan of Care  Patient       Patient will benefit from skilled therapeutic intervention in order to improve the following deficits and impairments:   Body Structure / Function / Physical Skills:  ADL, Dexterity, ROM, IADL, Body mechanics, Mobility, Coordination, FMC, Pain, UE functional use, Decreased knowledge of use of DME, Sensation       Visit Diagnosis: Muscle weakness (generalized)  Other symptoms and signs involving the musculoskeletal system  Other lack of coordination    Problem List Patient Active Problem List   Diagnosis Date Noted  . Urinary dysfunction 04/12/2019  . Osteoarthritis of right knee 08/23/2018  . Constipation due to opioid therapy 03/30/2018  . Retinopathy of both eyes 01/06/2018  . SNHL (sensorineural hearing loss) 12/04/2017  . Abnormal urinary stream 12/03/2017  . Osteoarthritis of carpometacarpal (CMC) joint of thumb 11/30/2017  . Muscle weakness 11/17/2017  . Transient vision disturbance  11/12/2017  . Bilateral hand pain 10/30/2017  . Pain of left hip joint 10/09/2017  . Gynecomastia 07/10/2017  . Iron deficiency anemia 07/05/2017  . Spasticity 05/20/2017  . Lumbar radiculitis 04/20/2017  . Ulnar 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, left 11/28/2016  . Idiopathic peripheral neuropathy 11/28/2016  . 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 Long term (current) use of anticoagulants 08/23/2015  . Right upper quadrant abdominal pain 08/23/2015  . Memory loss 05/10/2015  . Gait abnormality 04/07/2015  . Alkaline phosphatase elevation 04/07/2015  . History of thrombosis 03/26/2015  . Medial epicondylitis 02/07/2015  . Cognitive decline 12/21/2014  . Leukopenia 12/05/2014  . Rotator cuff syndrome of right shoulder 10/27/2014  . Status post left knee replacement 08/22/2014  . Left lateral epicondylitis 08/22/2014  . Chronic cerebral ischemia 08/18/2014  . Arthrofibrosis of knee joint 08/17/2014  . Cubital canal compression syndrome, left 08/17/2014  . 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 Chronic pain syndrome 04/10/2014  . Insomnia 04/10/2014  . Chronic non-specific white matter lesions on MRI 04/10/2014  . CFS (chronic fatigue syndrome) 04/10/2014  . 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 Biceps tendonitis on left 03/01/2014  . Polycythemia vera ([place]) 12/27/2013  . H/O TIA (transient ischemic attack) and stroke 12/27/2013  . Neck pain 12/27/2013  . OSA (obstructive sleep apnea) 12/08/2013  . 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 Depression with anxiety 08/04/2013  . Protein C deficiency ([place]) 08/01/2013  . Post traumatic stress disorder (PTSD) 08/01/2013  . Speech abnormality 07/25/2013  . Obesity 05/10/2013  . Lower extremity edema 05/10/2013  . GERD (gastroesophageal reflux disease) 05/10/2013  . Arthritis 05/10/2013  . OA (osteoarthritis) of knee 03/15/2013  . Headache 10/25/2012  . Palpitations 10/18/2012  . Fatty liver determined by biopsy 06/01/2012   . Arthropathic psoriasis, unspecified ([place]) 04/29/2012  . Abnormal liver enzymes 03/29/2012  . Psoriatic arthritis ([place]) 12/29/2011  . Left Renal Hydronephrosis 12/11/2010  . 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 Celiac disease 05/27/2010  . Thyroid nodule 05/27/2010  . Male-to-male transgender person 09/20/2002    OCCUPATIONAL THERAPY DISCHARGE SUMMARY  Visits from Start of Care: 9  Current functional level related to goals / functional outcomes: See above   Remaining deficits: Pain (pending R hand surgery), decr strength--did not fully address R hand strength/pain due to pending R hand surgery   Education / Equipment: Pt instructed in AE/activity modification/joint protection techniques, HEP for L hand strength and bilateral shoulder/proximal UE strength.  Pt verbalized understanding  Plan: Patient agrees to discharge.  Patient goals were partially met. Patient is being discharged due to  Reaching maximal rehab at this time?????      [facility] 04/27/2019, 2:43 PM  [facility] [address] [place], [place], [address] Phone: [phone]   Fax:  [phone]  Name: Trueman Worlds MRN: [id number] Date of Birth: 05/20/1965   Vianne Bulls, OTR/L [facility] [address] [place], [place]  [address] [phone] phone [phone] 04/27/19 2:43 PM

## 2019-04-27 NOTE — Progress Notes (Signed)
Virtual Visit via Video Note  I connected with Jesse Bowers on 04/27/19 at 10:00 AM EST by a video enabled telemedicine application and verified that I am speaking with the correct person using two identifiers.  Location: Patient: Home Provider: Office   I discussed the limitations of evaluation and management by telemedicine and the availability of in person appointments. The patient expressed understanding and agreed to proceed.  History of Present Illness: Jesse Bowers is a 54 year old male with a history of hematuria, chronic UPJ obstruction and urgency/urge incontinence who is contacted via [email or web address] for Lasix renogram result.    IMPRESSION: 1. Nonobstructive hydronephrosis of the LEFT kidney. Normal clearance from the mildly dilated LEFT renal pelvis following Lasix administration. 2. Normal RIGHT kidney.   Electronically Signed   By: Suzy Bouchard M.D.   On: 04/19/2019 17:29  He is also taking the Myrbetriq 50 mg in the morning and finding himself voiding a minimal amount of times during the day and several times in the evening and through the night.    He states he has not voiding gross hematuria, but he has noted darker colored urine from time to time.   Patient denies any modifying or aggravating factors.  Patient denies any gross hematuria, dysuria or suprapubic/flank pain.  Patient denies any fevers, chills, nausea or vomiting.     Observations/Objective: CLINICAL DATA:  Hydronephrosis of the LEFT kidney.  EXAM: NUCLEAR MEDICINE RENAL SCAN WITH DIURETIC ADMINISTRATION  TECHNIQUE: Radionuclide angiographic and sequential renal images were obtained after intravenous injection of radiopharmaceutical. Imaging was continued during slow intravenous injection of Lasix approximately 15 minutes after the start of the examination.  RADIOPHARMACEUTICALS:  Sub 5.22 mCi Technetium-25mMAG3 IV  COMPARISON:  None.  FINDINGS: Flow:  Prompt symmetric arterial flow to  the kidneys.  Left renogram: Prompt renal cortical uptake. Counts are promptly excreted into the LEFT renal pelvis which is patulous. Following administration of Lasix counts clear of the LEFT renal pelvis normally. Mild postvoid void residual on the LEFT.  Right renogram: Uniform uptake of counts in the renal cortex. Counts are promptly excreted into the collecting system and cleared prior to administration of Lasix. Lasix augment clearance. No postvoid residual.  Differential:  Left kidney = 46 %  Right kidney = 54 %  T1/2 post Lasix :  Left kidney = 6.7 min  Right kidney = 10.7 (counts clear near completely prior to administration of Lasix)  IMPRESSION: 1. Nonobstructive hydronephrosis of the LEFT kidney. Normal clearance from the mildly dilated LEFT renal pelvis following Lasix administration. 2. Normal RIGHT kidney.   Electronically Signed   By: SSuzy BouchardM.D.   On: 04/19/2019 17:29   Assessment and Plan: 1. Left chronic UPJ obstruction - will continue to follow conservatively - intermittent imaging   2. History of hematuria Hematuria work up completed in 12/2018 - findings positive for chronic left UPJ obstruction No report of gross hematuria RTC in one year for UA - patient to report any gross hematuria in the interim    3. OAB Continue the Myrbetriq 50 mg, but start taking the Myrbetriq in the evening to see if there is better control during the night and better voids during the day  Follow Up Instructions:    I discussed the assessment and treatment plan with the patient. The patient was provided an opportunity to ask questions and all were answered. The patient agreed with the plan and demonstrated an understanding of the instructions.   The patient  was advised to call back or seek an in-person evaluation if the symptoms worsen or if the condition fails to improve as anticipated.  I provided 15 minutes of non-face-to-face time  during this encounter.   Rolf Fells, PA-C

## 2019-04-27 NOTE — Progress Notes (Signed)
Cardiology Office Note:    Date:  04/28/2019   ID:  Jesse Bowers, DOB 31-Mar-1965, MRN [id number]  PCP:  Shawnee Knapp, MD  Cardiologist:  Sanda Klein, MD   Referring MD: Concepcion Elk, MD   Chief Complaint  Patient presents with  . Congestive Heart Failure    History of Present Illness:    Jesse Bowers is a 54 y.o. adult with a hx of chronic diastolic heart failure, obstructive sleep apnea on CPAP, history of previous stroke on chronic anticoagulation with Xarelto (protein C deficiency) returning for routine follow-up. 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  He has not had major cardiac problems since his last appointment.  He is stable on a 60 mg daily dose of furosemide, without serious problems with edema or orthopnea.  He remains relatively sedentary due to the limitations imposed by his orthopedic problems and previous stroke.  He was surprised to see congestive heart failure listed in the diagnoses so we reviewed this in some detail today.  I explained that this is why he has been taking Lasix for several years now.  He has preserved left ventricular systolic function but does have left ventricular hypertrophy and echo evidence of diastolic dysfunction.  We tried to get a repeat echocardiogram before this appointment but it is scheduled few days from now.  His last echo was in 2018.  He is no longer taking prazosin and has not had any recent problems with orthostatic hypotension.  His blood pressure today is excellent at 120/69 but he reports that this is unusually low for him, since typically at home his diastolic pressure has been in the 80s.  He has not had any falls, injuries or serious bleeding problems and is compliant with daily rivaroxaban for protein C deficiency.  He has not had any new focal neurological events.  He tried taking armodafinil for daytime hypersomnolence due to complex obstructive and central sleep  apnea, but it did not really help so he stopped taking it.  He is using his BiPAP. On Cosentyx for psoriatic arthritis.  His daughter will be going to college at [employer] in [place].  Past Medical History:  Diagnosis Date  . Abnormal weight loss   . Anxiety   . Arthritis   . Cataract    OU  . Celiac disease   . Cervical neck pain with evidence of disc disease    patient has a cyst   . Chronic constipation   . Chronic diastolic heart failure ([place])    Pt. denies  . Chronic pain   . Degenerative disc disease at L5-S1 level    with stenosis  . DVT (deep venous thrombosis) (H[facility])    Right upper arm, bilateral leg  . Eczema    inguinal, feet  . Elevated liver enzymes   . 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 Family history of adverse reaction to anesthesia    family has problems with anesthesia of nausea and vomiting   . 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 GERD (gastroesophageal reflux disease)    History of in 20's  . Gluten enteropathy   . H/O parotitis    right   . Hard of hearing   . History of kidney stones   . History of retinal tear    Bilateral  . History of staph infection    required wound vac  . Hx-TIA (transient ischemic attack)  2015  . LVH (left ventricular hypertrophy) 12/15/2016   Mild, noted on ECHO  . MVP (mitral valve prolapse)   . NAFL (nonalcoholic fatty liver)   . Neck pain   . Neuromuscular disorder ([place])    bilateral neuropathy feet.  . Pneumonia 12/17/2010  . Polycythemia   . Polycythemia, secondary   . PONV (postoperative nausea and vomiting)   . Protein C deficiency ([place])    Dr. Anne Fu  . Psoriasis    16 X10 cm psoriatic rash on sole of left foot ; open and occ scant bleeding;   . psoriatic arthritis   . PTSD (post-traumatic stress disorder)   . Scaphoid fracture of wrist 09/23/2013  . Seizure ([place])    childhood, medication until age 60 then weanned completely off  . Sleep apnea    split  night study last done by Dr. [name] 06/18/15 shows severe OSA, CSA, and hypersomnia, rec bipap  . Splenomegaly   . Stenosis of ureteropelvic junction (UPJ)    left  . Stroke [facility])    CVA vs TIA in left cerebrum causing slight right sided weakness-Dr. [name] follows  . Syrinx of spinal cord ([place]) 01/06/2014   c spine on MRI  . Tachycardia    hx of   . Transfusion history    past history- none recent, after surgeries due to blood loss  . Wears glasses   . Wears hearing aid     Past Surgical History:  Procedure Laterality Date  . ABDOMINAL HYSTERECTOMY Bilateral 1994   TAH, BSO- tranverse incision at 54 yo  . ANKLE ARTHROSCOPY WITH RECONSTRUCTION Right 2007  . CHOLECYSTECTOMY     laparoscopic  . COLONOSCOPY     x3  . EYE SURGERY     Left eye 03/02/2018, right 02/15/2018  . HIP ARTHROSCOPY W/ LABRAL REPAIR Right 05/11/2013   acetabular labral tear 03/30/2013  . KNEE ARTHROPLASTY Right   . KNEE JOINT MANIPULATION Left    x3 under anesthesia  . KNEE SURGERY Bilateral 1984   Right ACL, left PCL repair  . LITHOTRIPSY  2005  . LIVER BIOPSY  2013   normal results.  [name] MASTECTOMY Bilateral    prior to 2009  . MOUTH SURGERY    . NASAL SEPTUM SURGERY N/A 09/20/2015   by ENT Dr. [name]  . OVARIAN CYST SURGERY Left    size of grapefruit, was informed that she had shortened vagina  . SHOULDER SURGERY Bilateral    Right 08/15/2016, Left 11/15/2016  . THUMB ARTHROSCOPY Left   . THYROIDECTOMY, PARTIAL Left 2008  . TOTAL KNEE ARTHROPLASTY Right 08/23/2018   Procedure: TOTAL KNEE ARTHROPLASTY;  Surgeon: [name], MD;  Location: [facility];  Service: Orthopedics;  Laterality: Right;  80mn  . TOTAL KNEE REVISION Left 02/06/2016   Procedure: LEFT TOTAL KNEE REVISION;  Surgeon: FGaynelle Arabian MD;  Location: [facility];  Service: Orthopedics;  Laterality: Left;  . TOTAL KNEE REVISION Left 04/22/2017   Procedure: Left knee polyethylene revision;  Surgeon: AGaynelle Arabian MD;  Location: [facility];   Service: Orthopedics;  Laterality: Left;  . UPPER GI ENDOSCOPY  2003    Current Medications: Current Meds  Medication Sig  . acyclovir ointment (ZOVIRAX) 5 % Apply 1 application topically daily as needed (cold sores).   .Marland Kitchenantiseptic oral rinse (BIOTENE) LIQD 15 mLs by Mouth Rinse route as needed for dry mouth.  .Marland Kitchenaugmented betamethasone dipropionate (DIPROLENE-AF) 0.05 % ointment Apply 1 application topically 2 (two) times daily.  .[name]  baclofen (LIORESAL) 20 MG tablet Take 1 tablet (20 mg total) by mouth 4 (four) times daily.  . Calcipotriene-Betameth Diprop (ENSTILAR) 0.005-0.064 % FOAM Apply 1 application topically 2 (two) times daily as needed.   . clobetasol (TEMOVATE) 0.05 % GEL Apply 1 application topically 2 (two) times daily as needed (itching).  . clorazepate (TRANXENE) 15 MG tablet Take 15 mg by mouth at bedtime.  . clorazepate (TRANXENE) 7.5 MG tablet Take 7.5 mg by mouth 2 (two) times daily.   [name] desonide (DESOWEN) 0.05 % ointment Apply 1 application topically 2 (two) times daily as needed (psoriasis).   [name] diclofenac sodium (VOLTAREN) 1 % GEL APPLY TOPICALLY TO BOTH HANDS 3 TIMES DAILY. (Patient taking differently: Apply 1 application topically 3 (three) times daily as needed. )  . Emollient (CERAVE) CREA Apply 1 application topically 2 (two) times daily as needed.   . Fluocinolone Acetonide 0.01 % OIL Place 3 drops into both ears 2 (two) times daily as needed for itching.  . furosemide (LASIX) 20 MG tablet Take 1 tablet (20 mg total) by mouth daily. To be taken with the 40 mg to equal 60 mg daily  . furosemide (LASIX) 40 MG tablet Take 1 tablet (40 mg total) by mouth daily. To be taken with the 20 mg to equal 60 mg daily.  . mirabegron ER (MYRBETRIQ) 50 MG TB24 tablet Take 1 tablet (50 mg total) by mouth daily.  [name] morphine (KADIAN) 60 MG 24 hr capsule Take 1 capsule (60 mg total) by mouth every 12 (twelve) hours.  [name] NEEDLE, DISP, 18 G 18G X 1-1/2" MISC 1 Units by Does not apply route  once a week.  [name] NEEDLE, DISP, 23 G 23G X 3/4" MISC 1 Units by Does not apply route once a week.  . neomycin-polymyxin-hydrocortisone (CORTISPORIN) 3.5-10000-1 OTIC suspension Place 4 drops into both ears 2 (two) times daily as needed (ear pain).   . Oxycodone HCl 10 MG TABS Take 1 tablet (10 mg total) by mouth every 8 (eight) hours as needed.  . potassium chloride (KLOR-CON) 10 MEQ tablet TAKE K-DUR 2 TABLETS 10 MEQ DAILY  . Propylene Glycol (SYSTANE COMPLETE) 0.6 % SOLN Place 1-2 drops into both eyes 2 (two) times daily as needed (dry eyes).   . rivaroxaban (XARELTO) 20 MG TABS tablet Take 1 tablet (20 mg total) by mouth at bedtime. TAKE 1 TABLET DAILY BEFORE BEDTIME  . Secukinumab (COSENTYX SENSOREADY PEN) 150 MG/ML SOAJ Inject 300 mg into the skin every 28 (twenty-eight) days.   . Syringe, Disposable, 1 ML MISC 1 Units by Does not apply route once a week.  . testosterone cypionate (DEPOTESTOSTERONE CYPIONATE) 200 MG/ML injection INJECT 0.5 MLS (100 MG TOTAL) INTO THE MUSCLE ONCE A WEEK.  . ursodiol (ACTIGALL) 250 MG tablet Take 250 mg by mouth 2 (two) times daily with a meal.   . ursodiol (ACTIGALL) 500 MG tablet Take 500 mg by mouth 2 (two) times daily with a meal.   . venlafaxine XR (EFFEXOR-XR) 150 MG 24 hr capsule Take 2 capsules (300 mg total) by mouth daily with breakfast.     Allergies:   Gabapentin, Penicillin g, Sulfa antibiotics, Tegaderm chg dressing  [chlorhexidine], Vancomycin, Duloxetine, Tegaderm ag mesh [silver], Citalopram, Wheat bran, Ibuprofen, Nortriptyline, Pregabalin, and Sulfacetamide sodium-sulfur   Social History   Socioeconomic History  . Marital status: Married    Spouse name: Not on file  . Number of children: 2  . Years of education: 4y college  .  Highest education level: Not on file  Occupational History  . Occupation: Pediatric Nurse practitioner    Comment: Not working since [place] 2015  Tobacco Use  . Smoking status: Never Smoker  . Smokeless tobacco:  Never Used  Substance and Sexual Activity  . Alcohol use: Yes    Comment: social  . Drug use: No  . Sexual activity: Yes    Birth control/protection: None    Comment: patient is a transgender on testosterone shots, no biological kids  Other Topics Concern  . Not on file  Social History Narrative   Education 4 year college, former Therapist, sports X 15 years, pediatric nurse practitioner x 6 years, did NP degree from [employer] [place]. Relocated to [place] about 2 months ago from [place], [place]. Patient was in [place] for last 4 years and prior to that in [place]. His wife is working as Scientist, research (physical sciences) for [employer]. Patient is not working and applying for disability. They have 2 kids but no biologic children.    Social Determinants of Health   Financial Resource Strain:   . Difficulty of Paying Living Expenses: Not on file  Food Insecurity:   . Worried About Charity fundraiser in the Last Year: Not on file  . Ran Out of Food in the Last Year: Not on file  Transportation Needs:   . Lack of Transportation (Medical): Not on file  . Lack of Transportation (Non-Medical): Not on file  Physical Activity:   . Days of Exercise per Week: Not on file  . Minutes of Exercise per Session: Not on file  Stress:   . Feeling of Stress : Not on file  Social Connections:   . Frequency of Communication with Friends and Family: Not on file  . Frequency of Social Gatherings with Friends and Family: Not on file  . Attends Religious Services: Not on file  . Active Member of Clubs or Organizations: Not on file  . Attends Archivist Meetings: Not on file  . Marital Status: Not on file     Family History: The patient's family history includes Cancer in his paternal grandfather; Congestive Heart Failure in his maternal grandmother; Glaucoma in his maternal grandfather and mother; Heart attack in his maternal grandfather, maternal grandmother, and paternal grandfather; Hypertension in his mother; Macular  degeneration in his maternal grandfather; Other in his mother; Polycythemia in his paternal uncle; Protein C deficiency (age of onset: 57) in his sister; Psoriasis in his mother; Stroke in his maternal grandfather, maternal grandmother, and paternal uncle.  ROS:   Please see the history of present illness.    All other systems are reviewed and are negative  EKGs/Labs/Other Studies Reviewed:    EKG:  EKG is ordered today shows normal sinus rhythm with a vertical axis, QTC 392 ms, no ischemic repolarization abnormalities, unchanged from previous tracings.  Recent Labs: 04/29/2018: TSH 1.370 02/23/2019: ALT 22; BUN 14; Creatinine, Ser 1.09; Potassium 3.9; Sodium 138 03/11/2019: Hemoglobin 13.6; Platelets 232  Recent Lipid Panel    Component Value Date/Time   CHOL 142 01/05/2018 1509   TRIG 45 01/05/2018 1509   HDL 52 01/05/2018 1509   CHOLHDL 2.7 01/05/2018 1509   LDLCALC 81 01/05/2018 1509    Physical Exam:    VS:  BP 120/69   Pulse 79   Temp (!) 96.8 F (36 C)   Ht 5' 9"  (1.753 m)   Wt 208 lb 9.6 oz (94.6 kg)   SpO2 97%   BMI 30.80  kg/m     Wt Readings from Last 3 Encounters:  04/27/19 208 lb 9.6 oz (94.6 kg)  04/12/19 205 lb 8 oz (93.2 kg)  04/07/19 207 lb 14.4 oz (94.3 kg)      General: Alert, oriented x3, no distress, obese Head: no evidence of trauma, PERRL, EOMI, no exophtalmos or lid lag, no myxedema, no xanthelasma; normal ears, nose and oropharynx Neck: normal jugular venous pulsations and no hepatojugular reflux; brisk carotid pulses without delay and no carotid bruits Chest: clear to auscultation, no signs of consolidation by percussion or palpation, normal fremitus, symmetrical and full respiratory excursions Cardiovascular: normal position and quality of the apical impulse, regular rhythm, normal first and second heart sounds, no murmurs, rubs or gallops Abdomen: no tenderness or distention, no masses by palpation, no abnormal pulsatility or arterial bruits,  normal bowel sounds, no hepatosplenomegaly Extremities: no clubbing, cyanosis or edema; 2+ radial, ulnar and brachial pulses bilaterally; 2+ right femoral, posterior tibial and dorsalis pedis pulses; 2+ left femoral, posterior tibial and dorsalis pedis pulses; no subclavian or femoral bruits Neurological: grossly nonfocal Psych: Normal mood and affect   ASSESSMENT:    1. Chronic diastolic heart failure ([place])   2. OSA (obstructive sleep apnea)   3. Protein C deficiency ([place])   4. Long term (current) use of anticoagulants    PLAN:    In order of problems listed above:  1. CHF: As at his previous appointment,: Describes shortness of breath when he bends over but otherwise has no symptoms to suggest hypervolemia.  His physical exam does not show edema or jugular venous distention.  She has not had recent problems with orthostatic hypotension.  As far as I can tell based on clinical criteria he appears to be euvolemic.  As he pointed out, I am indeed pleased that he is no longer taking prazosin since this put him at risk for syncope and falls.  The current dose of furosemide appears to be at the appropriate point.  We went over the pathophysiology of diastolic heart failure in some detail today.  We will see if the follow-up echo offers any additional information. 2. Claudication: He had normal ABIs in September 2020.  Suspect the claudication is neurogenic.   3. Palpitations: No complaints recently. 4. OSA: Compliant with BiPAP.  Still has some daytime hypersomnolence, but armodafinil did not help. 5. Protein C deficiency: On chronic rivaroxaban. 6. Anticoagulation: Well-tolerated without bleeding problems.   Medication Adjustments/Labs and Tests Ordered: Current medicines are reviewed at length with the patient today.  Concerns regarding medicines are outlined above.  Orders Placed This Encounter  Procedures  . Ambulatory referral to Cardiology  . EKG 12-Lead  . ECHOCARDIOGRAM COMPLETE    No orders of the defined types were placed in this encounter.   Patient Instructions  Medication Instructions:  No changes *If you need a refill on your cardiac medications before your next appointment, please call your pharmacy*  Lab Work: None ordered If you have labs (blood work) drawn today and your tests are completely normal, you will receive your results only by: [name] MyChart Message (if you have MyChart) OR . A paper copy in the mail If you have any lab test that is abnormal or we need to change your treatment, we will call you to review the results.  Testing/Procedures: Your physician has requested that you have an echocardiogram. Echocardiography is a painless test that uses sound waves to create images of your heart. It provides your doctor with  information about the size and shape of your heart and how well your heart's chambers and valves are working. You may receive an ultrasound enhancing agent through an IV if needed to better visualize your heart during the echo.This procedure takes approximately one hour. There are no restrictions for this procedure. This will take place at the [address].    Follow-Up: At [facility], you and your health needs are our priority.  As part of our continuing mission to provide you with exceptional heart care, we have created designated Provider Care Teams.  These Care Teams include your primary Cardiologist (physician) and Advanced Practice Providers (APPs -  Physician Assistants and Nurse Practitioners) who all work together to provide you with the care you need, when you need it.  Your next appointment:   6 month(s)  The format for your next appointment:   In Person  Provider:   You may see Sanda Klein, MD or one of the following Advanced Practice Providers on your designated Care Team:    Almyra Deforest, PA-C  Fabian Sharp, [place] or   Roby Lofts, [place]   Other Instructions A referral has been made to Dr.  Claiborne Billings for the sleep clinic     Signed, Sanda Klein, MD  04/28/2019 9:39 PM    [place]

## 2019-04-28 ENCOUNTER — Telehealth: Payer: Self-pay | Admitting: Cardiovascular Disease

## 2019-04-28 ENCOUNTER — Ambulatory Visit: Payer: BC Managed Care – PPO | Admitting: Psychology

## 2019-04-28 ENCOUNTER — Encounter: Payer: Self-pay | Admitting: Cardiovascular Disease

## 2019-04-28 NOTE — Telephone Encounter (Signed)
04/28/19 LVM to call and schedule appt--- AF

## 2019-04-29 ENCOUNTER — Ambulatory Visit: Payer: BC Managed Care – PPO | Admitting: Physical Therapy

## 2019-04-29 ENCOUNTER — Other Ambulatory Visit: Payer: Self-pay

## 2019-04-29 DIAGNOSIS — R29818 Other symptoms and signs involving the nervous system: Secondary | ICD-10-CM

## 2019-04-29 DIAGNOSIS — R278 Other lack of coordination: Secondary | ICD-10-CM

## 2019-04-29 DIAGNOSIS — R2689 Other abnormalities of gait and mobility: Secondary | ICD-10-CM | POA: Diagnosis not present

## 2019-04-29 DIAGNOSIS — R29898 Other symptoms and signs involving the musculoskeletal system: Secondary | ICD-10-CM

## 2019-04-29 DIAGNOSIS — R2681 Unsteadiness on feet: Secondary | ICD-10-CM

## 2019-04-29 DIAGNOSIS — M6281 Muscle weakness (generalized): Secondary | ICD-10-CM

## 2019-04-29 NOTE — Therapy (Signed)
[facility] [address] [place], [place], [address] Phone: [phone]   Fax:  [phone]  Physical Therapy Treatment/Re-Cert  Patient Details  Name: Jesse Bowers MRN: [id number] Date of Birth: 01/06/1966 Referring Provider (PT): Clerance Lav, MD   Encounter Date: 04/29/2019  PT End of Session - 04/29/19 1221    Visit Number  13    Number of Visits  24    Date for PT Re-Evaluation  06/28/19    Authorization Type  [employer] primary -September 2020-Feb 28th 2021, [facility] secondary (however may lose part B) - 30 visit limit combined PT/OT (Same day counts as 1 visit)    Authorization - Visit Number  16    Authorization - Number of Visits  30   starts over in March   PT Start Time  1016    PT Stop Time  1100    PT Time Calculation (min)  44 min    Equipment Utilized During Treatment  Gait belt    Activity Tolerance  Patient tolerated treatment well    Behavior During Therapy  [facility] for tasks assessed/performed   verbose, needs re-direction at times      Past Medical History:  Diagnosis Date  . Abnormal weight loss   . Anxiety   . Arthritis   . Cataract    OU  . Celiac disease   . Cervical neck pain with evidence of disc disease    patient has a cyst   . Chronic constipation   . Chronic diastolic heart failure ([place])    Pt. denies  . Chronic pain   . Degenerative disc disease at L5-S1 level    with stenosis  . DVT (deep venous thrombosis) (H[facility])    Right upper arm, bilateral leg  . Eczema    inguinal, feet  . Elevated liver enzymes   . 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 Family history of adverse reaction to anesthesia    family has problems with anesthesia of nausea and vomiting   . 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 GERD (gastroesophageal reflux disease)    History of in 20's  . Gluten enteropathy   . H/O parotitis    right   . Hard of hearing   . History of kidney stones   . History of  retinal tear    Bilateral  . History of staph infection    required wound vac  . Hx-TIA (transient ischemic attack)    2015  . LVH (left ventricular hypertrophy) 12/15/2016   Mild, noted on ECHO  . MVP (mitral valve prolapse)   . NAFL (nonalcoholic fatty liver)   . Neck pain   . Neuromuscular disorder ([place])    bilateral neuropathy feet.  . Pneumonia 12/17/2010  . Polycythemia   . Polycythemia, secondary   . PONV (postoperative nausea and vomiting)   . Protein C deficiency ([place])    Dr. Anne Fu  . Psoriasis    16 X10 cm psoriatic rash on sole of left foot ; open and occ scant bleeding;   . psoriatic arthritis   . PTSD (post-traumatic stress disorder)   . Scaphoid fracture of wrist 09/23/2013  . Seizure ([place])    childhood, medication until age 36 then weanned completely off  . Sleep apnea    split night study last done by Dr. [name] 06/18/15 shows severe OSA, CSA, and hypersomnia, rec bipap  . Splenomegaly   . Stenosis of ureteropelvic junction (UPJ)  left  . Stroke [facility])    CVA vs TIA in left cerebrum causing slight right sided weakness-Dr. [name] follows  . Syrinx of spinal cord ([place]) 01/06/2014   c spine on MRI  . Tachycardia    hx of   . Transfusion history    past history- none recent, after surgeries due to blood loss  . Wears glasses   . Wears hearing aid     Past Surgical History:  Procedure Laterality Date  . ABDOMINAL HYSTERECTOMY Bilateral 1994   TAH, BSO- tranverse incision at 54 yo  . ANKLE ARTHROSCOPY WITH RECONSTRUCTION Right 2007  . CHOLECYSTECTOMY     laparoscopic  . COLONOSCOPY     x3  . EYE SURGERY     Left eye 03/02/2018, right 02/15/2018  . HIP ARTHROSCOPY W/ LABRAL REPAIR Right 05/11/2013   acetabular labral tear 03/30/2013  . KNEE ARTHROPLASTY Right   . KNEE JOINT MANIPULATION Left    x3 under anesthesia  . KNEE SURGERY Bilateral 1984   Right ACL, left PCL repair  . LITHOTRIPSY  2005  . LIVER BIOPSY  2013   normal results.  [name]  MASTECTOMY Bilateral    prior to 2009  . MOUTH SURGERY    . NASAL SEPTUM SURGERY N/A 09/20/2015   by ENT Dr. [name]  . OVARIAN CYST SURGERY Left    size of grapefruit, was informed that she had shortened vagina  . SHOULDER SURGERY Bilateral    Right 08/15/2016, Left 11/15/2016  . THUMB ARTHROSCOPY Left   . THYROIDECTOMY, PARTIAL Left 2008  . TOTAL KNEE ARTHROPLASTY Right 08/23/2018   Procedure: TOTAL KNEE ARTHROPLASTY;  Surgeon: [name], MD;  Location: [facility];  Service: Orthopedics;  Laterality: Right;  70mn  . TOTAL KNEE REVISION Left 02/06/2016   Procedure: LEFT TOTAL KNEE REVISION;  Surgeon: FGaynelle Arabian MD;  Location: [facility];  Service: Orthopedics;  Laterality: Left;  . TOTAL KNEE REVISION Left 04/22/2017   Procedure: Left knee polyethylene revision;  Surgeon: AGaynelle Arabian MD;  Location: [facility];  Service: Orthopedics;  Laterality: Left;  . UPPER GI ENDOSCOPY  2003    There were no vitals filed for this visit.  Subjective Assessment - 04/29/19 1023    Subjective  Does not the exact date for his surgery. Got scheduled for aquatic therapy next week.    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 PMH: Psoriatic arthritis, Polycythemia vera, CVA, s/p Rt TKR 08/23/18, Lt knee revision 04/2017, lumbar radiculitis, multiple scaphoid fx's Rt thumb, chronic pain    How long can you walk comfortably?  can walk around the house - furniture walking    Diagnostic tests  MRI cervical spine 07/2017: Unchanged C4 through C6 cervical spinal cord syrinx since the MRIin March 2016, and stable by report since October 2015., MRI brain 2019: No acute intracranial abnormality and stable noncontrast MRI appearance of the brain since 2017.    Patient Stated Goals  can't get up without using his hands, wants to be able to get out of his chair without using his hands - less pain in legs and back, be stronger.    Pain Onset  More than a month ago          [facility] - [facility]      Timed Up and Go Test   Normal TUG (seconds)  23.66    TUG Comments  with RW  [facility] PT Treatment/Exercise - 04/29/19 1032      Transfers   Sit to Stand  5: Supervision;With upper extremity assist    Sit to Stand Details  Verbal cues for sequencing;Verbal cues for technique;Verbal cues for precautions/safety;Visual cues/gestures for precautions/safety    Sit to Stand Details (indicate cue type and reason)  from mat table, cues for hand placement to decr stress on R wrist    Five time sit to stand comments   31 seconds from mat table - single UE support from mat and single UE on RW    Stand to Sit  5: Supervision;With upper extremity assist    Stand Pivot Transfers  6: Modified independent (Device/Increase time)    Stand Pivot Transfer Details (indicate cue type and reason)  from w/c > mat table with RW    Comments  2 x 5 reps sit <> stands from standard mat table       Ambulation/Gait   Ambulation/Gait  Yes    Ambulation/Gait Assistance  5: Supervision;4: Min guard    Ambulation/Gait Assistance Details  tactile and verbal cues to relax shoulders, and to spot 10' ahead to improve posture, 3 separate bouts in 115'     Ambulation Distance (Feet)  115 Feet    Assistive device  Rolling walker    Gait Pattern  Step-through pattern;Trunk flexed;Decreased dorsiflexion - left;Decreased dorsiflexion - right;Decreased hip/knee flexion - right;Decreased hip/knee flexion - left;Decreased stance time - left;Right foot flat;Left foot flat    Ambulation Surface  Level;Indoor    Stairs  Yes    Stairs Assistance  5: Supervision    Stair Management Technique  Two rails;Step to pattern;Forwards    Number of Stairs  8    Height of Stairs  6      Therapeutic Activites    Therapeutic Activities  Other Therapeutic Activities    Other Therapeutic Activities  discussed POC going forward and pt's insurance (has a 30 visit  limit that restarts in March) - pt to figure out with insurance if this is a hard stop.       Exercises   Exercises  Other Exercises    Other Exercises   SciFit level 4.0 with BLE and BUE for strengthening, ROM, and activity tolerance for 6 minutes                PT Short Term Goals - 04/15/19 0909      PT SHORT TERM GOAL #1   Title  Pt will be independent with initial HEP for improved strength, balance, and gait. ALL STGS DUE 04/06/19    Time  4   due to delay in scheduling   Period  Weeks    Status  Achieved    Target Date  04/06/19      PT SHORT TERM GOAL #2   Title  Patient will undergo further assessment of TUG with use of RW vs. BERG  in order to determine fall risk - goal written as appropriate.    Baseline  performed TUG with RW    Time  4    Period  Weeks    Status  Achieved      PT SHORT TERM GOAL #3   Title  Patient will verbalize understanding of fall prevention strategies in the home.    Time  4    Period  Weeks    Status  Deferred      PT SHORT TERM GOAL #4   Title  Patient will ambulate at least 25' with RW with supervision in order to safely ambulate at home.    Baseline  pt has ambulated 115' in clinic with min guard    Time  4    Period  Weeks    Status  Partially Met      PT SHORT TERM GOAL #5   Title  Patient will perform 4 steps with supervision using single railing and step to pattern in order to increase safety at home with stairs.    Baseline  4 steps with supervision/min guard with BUE support on railings and step to pattern.    Time  4    Period  Weeks    Status  Not Met      PT SHORT TERM GOAL #6   Title  Perform 5x sit to stand from standard arm chair vs. mat table and write goal as appropriate.    Baseline  met on 04/08/19    Time  4    Period  Weeks    Status  Achieved        PT Long Term Goals - 04/29/19 1030      PT LONG TERM GOAL #1   Title  Pt will be independent with final HEP for land/aquatic therapy for improved  strength, ROM, balance, and gait. ALL LTGS DUE 05/04/19    Baseline  pt independent with HEP for land therapy - not quite yet for aquatic therapy (has only had a couple sessions)    Time  8   due to delay in scheduling   Period  Weeks    Status  Partially Met      PT LONG TERM GOAL #2   Title  Pt will decr TUG score with RW to 24 seconds or less with RW to decr fall risk.    Baseline  23.66 seconds with RW on 04/29/19    Time  8    Period  Weeks    Status  Achieved      PT LONG TERM GOAL #3   Title  Patient will ambulate at least 150' with RW with mod I in order to safely ambulate at home.    Baseline  115' throughout with RW with supervision/min guard    Time  8    Period  Weeks    Status  Not Met      PT LONG TERM GOAL #4   Title  Patient will perform 12 steps with supervision using single railing and step to pattern in order to increase safety at home with stairs.    Baseline  8 steps with supervision with B railings and step to pattern    Time  8    Period  Weeks    Status  Partially Met      PT LONG TERM GOAL #5   Title  Patient will perform bed mobility, stap step transfers with RW with mod I in order to decrease caregiver burden.    Baseline  stand step with mod I with RW and can perform bed mobility with mod I    Time  8    Period  Weeks    Status  Achieved      PT LONG TERM GOAL #6   Title  Pt will decr 5x sit <> stand time from 36 seconds to 30 seconds or less from mat table using BUE support to demo improved functional LE strength.   31 seconds on 04/29/19   Time  8    Period  Weeks    Status  Not Met       Revised/on-going LTGs for re-cert:    PT Long Term Goals - 04/29/19 1030      PT LONG TERM GOAL #1   Title  Pt will be independent with final HEP for land/aquatic therapy for improved strength, ROM, balance, and gait. ALL LTGS DUE 06/24/19    Baseline  pt independent with HEP for land therapy - not quite yet for aquatic therapy (has only had a couple  sessions)    Time  8   due to delay in scheduling   Period  Weeks    Status  On-going    Target Date  06/24/19      PT LONG TERM GOAL #2   Title  Pt will decr TUG score with RW to 21 seconds or less with RW to decr fall risk.    Baseline  23.66 seconds with RW on 04/29/19    Time  8    Period  Weeks    Status  Revised      PT LONG TERM GOAL #3   Title  Patient will ambulate at least 150' with RW with mod I in order to safely ambulate at home.    Baseline  115' throughout with RW with supervision/min guard    Time  8    Period  Weeks    Status  On-going      PT LONG TERM GOAL #4   Title  Patient will perform 12 steps with mod I  using BUE suport on railing and step to pattern in order to increase safety at home with stairs.    Baseline  8 steps with supervision with B railings and step to pattern    Time  8    Period  Weeks    Status  On-going      PT LONG TERM GOAL #5   Title  Pt will undergo assessment of gait speed with RW- goal to be written as appropriate.    Baseline  --    Time  8    Period  Weeks    Status  New      PT LONG TERM GOAL #6   Title  Pt will decr 5x sit <> stand time from 28 seconds or less from mat table using BUE support to demo improved functional LE strength.   31 seconds on 04/29/19   Time  8    Period  Weeks    Status  Not Met          Plan - 04/29/19 1245    Clinical Impression Statement  Today's skilled session focused on assessing pt's LTGs - pt has achieved 2 out of 6 LTGs in regards to 5x sit <> stand and TUG time. Pt performed the TUG in 23.66 seconds with RW, which continues to put pt at a high fall risk. Pt needed intermittent cues throughout today's session for proper sit <> stand technique to decr stress upon R wrist (pt has upcoming surgery in March). Remainder of goals were partially/ not met - in regards to gait with RW and stairs. Pt continues to need min guard/supervision with RW with cues for posture and for balance when pt gets  fatigued. Will re-cert for 3-5K week for 6 weeks to continue with aquatic therapy and land therapy in order to decr fall risk, improve safe and functional mobility, and incr independence. Pt might have  to be on hold after surgery - he will call and let us know when surgery is going to be. LTGs revised as appropriate.    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 PMH: Psoriatic arthritis, Polycythemia vera, CVA, s/p Rt TKR 08/23/18, Lt knee revision 04/2017, lumbar radiculitis, multiple scaphoid fx's Rt thumb, chronic pain    Examination-Activity Limitations  Transfers;Stairs;Locomotion Level;Stand    Examination-Participation Restrictions  Community Activity    Stability/Clinical Decision Making  Evolving/Moderate complexity    Rehab Potential  Good    PT Frequency  1x / week   1-2x   PT Duration  6 weeks    PT Treatment/Interventions  ADLs/Self Care Home Management;Neuromuscular re-education;Therapeutic activities;Therapeutic exercise;Patient/family education;Manual techniques;Scar mobilization;Passive range of motion;Manual lymph drainage;Electrical Stimulation;Aquatic Therapy;Energy conservation;Gait training;Stair training;Functional mobility training;DME Instruction;Balance training;Orthotic Fit/Training    PT Next Visit Plan  when is his surgery? fall prevention strategies in the home. continue LE strengthening, gait training (walking around obstacles),  standing balance in // bars, fall prevention strategy handout. Nustep for strengthening/endurance. core strengthening. Assess gait speed with RW.    PT Home Exercise Plan  [id number]    Consulted and Agree with Plan of Care  Patient       Patient will benefit from skilled therapeutic intervention in order to improve the following deficits and impairments:  Decreased range of motion, Decreased endurance, Decreased activity tolerance, Abnormal gait,  Difficulty walking, Decreased coordination, Decreased balance, Decreased strength, Pain, Decreased mobility, Decreased safety awareness  Visit Diagnosis: Muscle weakness (generalized)  Other symptoms and signs involving the musculoskeletal system  Other lack of coordination  Unsteadiness on feet  Other symptoms and signs involving the nervous system  Other abnormalities of gait and mobility     Problem List Patient Active Problem List   Diagnosis Date Noted  . Urinary dysfunction 04/12/2019  . Osteoarthritis of right knee 08/23/2018  . Constipation due to opioid therapy 03/30/2018  . Retinopathy of both eyes 01/06/2018  . SNHL (sensorineural hearing loss) 12/04/2017  . Abnormal urinary stream 12/03/2017  . Osteoarthritis of carpometacarpal (CMC) joint of thumb 11/30/2017  . Muscle weakness 11/17/2017  . Transient vision disturbance 11/12/2017  . Bilateral hand pain 10/30/2017  . Pain of left hip joint 10/09/2017  . Gynecomastia 07/10/2017  . Iron deficiency anemia 07/05/2017  . Spasticity 05/20/2017  . Lumbar radiculitis 04/20/2017  . Ulnar 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, left 11/28/2016  . Idiopathic peripheral neuropathy 11/28/2016  . 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 Long term (current) use of anticoagulants 08/23/2015  . Right upper quadrant abdominal pain 08/23/2015  . Memory loss 05/10/2015  . Gait abnormality 04/07/2015  . Alkaline phosphatase elevation 04/07/2015  . History of thrombosis 03/26/2015  . Medial epicondylitis 02/07/2015  . Cognitive decline 12/21/2014  . Leukopenia 12/05/2014  . Rotator cuff syndrome of right shoulder 10/27/2014  . Status post left knee replacement 08/22/2014  . Left lateral epicondylitis 08/22/2014  . Chronic cerebral ischemia 08/18/2014  . Arthrofibrosis of knee joint 08/17/2014  . Cubital canal compression syndrome, left 08/17/2014  . 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 Chronic pain syndrome 04/10/2014  . Insomnia  04/10/2014  . Chronic non-specific white matter lesions on MRI 04/10/2014  . CFS (chronic fatigue syndrome) 04/10/2014  . 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 Biceps tendonitis on left 03/01/2014  . Polycythemia vera ([place]) 12/27/2013  . H/O TIA (transient ischemic attack) and stroke  12/27/2013  . Neck pain 12/27/2013  . OSA (obstructive sleep apnea) 12/08/2013  . 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 Depression with anxiety 08/04/2013  . Protein C deficiency ([place]) 08/01/2013  . Post traumatic stress disorder (PTSD) 08/01/2013  . Speech abnormality 07/25/2013  . Obesity 05/10/2013  . Lower extremity edema 05/10/2013  . GERD (gastroesophageal reflux disease) 05/10/2013  . Arthritis 05/10/2013  . OA (osteoarthritis) of knee 03/15/2013  . Headache 10/25/2012  . Palpitations 10/18/2012  . Fatty liver determined by biopsy 06/01/2012  . Arthropathic psoriasis, unspecified ([place]) 04/29/2012  . Abnormal liver enzymes 03/29/2012  . Psoriatic arthritis ([place]) 12/29/2011  . Left Renal Hydronephrosis 12/11/2010  . 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 Celiac disease 05/27/2010  . Thyroid nodule 05/27/2010  . Male-to-male transgender person 09/20/2002    Arliss Journey, PT, DPT  04/29/2019, 12:46 PM  [facility] [address] [place], [place], [address] Phone: [phone]   Fax:  [phone]  Name: Jesse Bowers MRN: [id number] Date of Birth: 13-Jul-1965

## 2019-04-30 ENCOUNTER — Ambulatory Visit (INDEPENDENT_AMBULATORY_CARE_PROVIDER_SITE_OTHER): Payer: BC Managed Care – PPO | Admitting: Psychology

## 2019-04-30 DIAGNOSIS — F4312 Post-traumatic stress disorder, chronic: Secondary | ICD-10-CM | POA: Diagnosis not present

## 2019-05-02 ENCOUNTER — Other Ambulatory Visit: Payer: Self-pay

## 2019-05-02 ENCOUNTER — Ambulatory Visit: Payer: BC Managed Care – PPO | Attending: Physical Medicine & Rehabilitation | Admitting: Physical Therapy

## 2019-05-02 ENCOUNTER — Encounter: Payer: Self-pay | Admitting: *Deleted

## 2019-05-02 DIAGNOSIS — R2681 Unsteadiness on feet: Secondary | ICD-10-CM | POA: Diagnosis present

## 2019-05-02 DIAGNOSIS — M6281 Muscle weakness (generalized): Secondary | ICD-10-CM | POA: Diagnosis present

## 2019-05-02 DIAGNOSIS — R2689 Other abnormalities of gait and mobility: Secondary | ICD-10-CM | POA: Insufficient documentation

## 2019-05-03 ENCOUNTER — Ambulatory Visit (INDEPENDENT_AMBULATORY_CARE_PROVIDER_SITE_OTHER): Payer: BC Managed Care – PPO | Admitting: Psychology

## 2019-05-03 ENCOUNTER — Encounter: Payer: Self-pay | Admitting: Physical Therapy

## 2019-05-03 DIAGNOSIS — F4323 Adjustment disorder with mixed anxiety and depressed mood: Secondary | ICD-10-CM | POA: Diagnosis not present

## 2019-05-03 DIAGNOSIS — F4312 Post-traumatic stress disorder, chronic: Secondary | ICD-10-CM

## 2019-05-03 NOTE — Therapy (Signed)
[facility] [address] [place], [place], [address] Phone: [phone]   Fax:  [phone]  Physical Therapy Treatment  Patient Details  Name: Jesse Bowers MRN: [id number] Date of Birth: Mar 20, 1965 Referring Provider (PT): Clerance Lav, MD   Encounter Date: 05/02/2019  PT End of Session - 05/03/19 1948    Visit Number  14    Number of Visits  24   visit 1 in March   Date for PT Re-Evaluation  06/28/19    Authorization Type  [employer] primary -September 2020-Feb 28th 2021, [employer] secondary (however may lose part B) - 30 visit limit combined PT/OT (Same day counts as 1 visit)    Authorization - Visit Number  17   visit 1 in March   Authorization - Number of Visits  30    PT Start Time  1330    PT Stop Time  1415    PT Time Calculation (min)  45 min    Equipment Utilized During Treatment  Other (comment)   ankle cuffs, bar bells   Activity Tolerance  Patient tolerated treatment well    Behavior During Therapy  [facility] for tasks assessed/performed       Past Medical History:  Diagnosis Date  . Abnormal weight loss   . Anxiety   . Arthritis   . Cataract    OU  . Celiac disease   . Cervical neck pain with evidence of disc disease    patient has a cyst   . Chronic constipation   . Chronic diastolic heart failure ([place])    Pt. denies  . Chronic pain   . Degenerative disc disease at L5-S1 level    with stenosis  . DVT (deep venous thrombosis) (HCC)    Right upper arm, bilateral leg  . Eczema    inguinal, feet  . Elevated liver enzymes   . 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 Family history of adverse reaction to anesthesia    family has problems with anesthesia of nausea and vomiting   . 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 GERD (gastroesophageal reflux disease)    History of in 20's  . Gluten enteropathy   . H/O parotitis    right   . Hard of hearing   . History of kidney stones   . History of  retinal tear    Bilateral  . History of staph infection    required wound vac  . Hx-TIA (transient ischemic attack)    2015  . LVH (left ventricular hypertrophy) 12/15/2016   Mild, noted on ECHO  . MVP (mitral valve prolapse)   . NAFL (nonalcoholic fatty liver)   . Neck pain   . Neuromuscular disorder ([place])    bilateral neuropathy feet.  . Pneumonia 12/17/2010  . Polycythemia   . Polycythemia, secondary   . PONV (postoperative nausea and vomiting)   . Protein C deficiency ([place])    Dr. Anne Fu  . Psoriasis    16 X10 cm psoriatic rash on sole of left foot ; open and occ scant bleeding;   . psoriatic arthritis   . PTSD (post-traumatic stress disorder)   . Scaphoid fracture of wrist 09/23/2013  . Seizure ([place])    childhood, medication until age 38 then weanned completely off  . Sleep apnea    split night study last done by Dr. [name] 06/18/15 shows severe OSA, CSA, and hypersomnia, rec bipap  . Splenomegaly   . Stenosis of ureteropelvic junction (  UPJ)    left  . Stroke [facility])    CVA vs TIA in left cerebrum causing slight right sided weakness-Dr. [name] follows  . Syrinx of spinal cord ([place]) 01/06/2014   c spine on MRI  . Tachycardia    hx of   . Transfusion history    past history- none recent, after surgeries due to blood loss  . Wears glasses   . Wears hearing aid     Past Surgical History:  Procedure Laterality Date  . ABDOMINAL HYSTERECTOMY Bilateral 1994   TAH, BSO- tranverse incision at 54 yo  . ANKLE ARTHROSCOPY WITH RECONSTRUCTION Right 2007  . CHOLECYSTECTOMY     laparoscopic  . COLONOSCOPY     x3  . EYE SURGERY     Left eye 03/02/2018, right 02/15/2018  . HIP ARTHROSCOPY W/ LABRAL REPAIR Right 05/11/2013   acetabular labral tear 03/30/2013  . KNEE ARTHROPLASTY Right   . KNEE JOINT MANIPULATION Left    x3 under anesthesia  . KNEE SURGERY Bilateral 1984   Right ACL, left PCL repair  . LITHOTRIPSY  2005  . LIVER BIOPSY  2013   normal results.  [name]  MASTECTOMY Bilateral    prior to 2009  . MOUTH SURGERY    . NASAL SEPTUM SURGERY N/A 09/20/2015   by ENT Dr. [name]  . OVARIAN CYST SURGERY Left    size of grapefruit, was informed that she had shortened vagina  . SHOULDER SURGERY Bilateral    Right 08/15/2016, Left 11/15/2016  . THUMB ARTHROSCOPY Left   . THYROIDECTOMY, PARTIAL Left 2008  . TOTAL KNEE ARTHROPLASTY Right 08/23/2018   Procedure: TOTAL KNEE ARTHROPLASTY;  Surgeon: [name], MD;  Location: [facility];  Service: Orthopedics;  Laterality: Right;  38mn  . TOTAL KNEE REVISION Left 02/06/2016   Procedure: LEFT TOTAL KNEE REVISION;  Surgeon: FGaynelle Arabian MD;  Location: [facility];  Service: Orthopedics;  Laterality: Left;  . TOTAL KNEE REVISION Left 04/22/2017   Procedure: Left knee polyethylene revision;  Surgeon: AGaynelle Arabian MD;  Location: [facility];  Service: Orthopedics;  Laterality: Left;  . UPPER GI ENDOSCOPY  2003    There were no vitals filed for this visit.  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 PMH: Psoriatic arthritis, Polycythemia vera, CVA, s/p Rt TKR 08/23/18, Lt knee revision 04/2017, lumbar radiculitis, multiple scaphoid fx's Rt thumb, chronic pain    How long can you walk comfortably?  can walk around the house - furniture walking    Diagnostic tests  MRI cervical spine 07/2017: Unchanged C4 through C6 cervical spinal cord syrinx since the MRIin March 2016, and stable by report since October 2015., MRI brain 2019: No acute intracranial abnormality and stable noncontrast MRI appearance of the brain since 2017.    Patient Stated Goals  can't get up without using his hands, wants to be able to get out of his chair without using his hands - less pain in legs and back, be stronger.    Currently in Pain?  Yes    Pain Score   5     Pain Location  Wrist    Pain Orientation  Right    Pain Descriptors / Indicators  Aching;Sore    Pain Type  Chronic pain  Pain Onset  More than a month ago    Pain Frequency  Constant            Aquatic therapy at [facility] - pool temp. 87.4 degrees  Patient seen for aquatic therapy today.  Treatment took place in water 2.5-4 feet deep depending upon activity.  Pt entered and exited  the pool via step negotiation with use Hand rails with supervision.   Pt performed runner's stretch for hamstring and heel cord stretching - 30 sec hold 2 reps on RLE & LLE  Pt gait trained in pool - 31mx 4 reps; bar bells used on 3rd & 4th rep for increased resistance for strengthening and also to fascilitate reciprocal arm swing in gait  Backwards amb. In pool 160m 1 rep with SBA for safety due to decr. balance  Pt performed marching in place 10 reps each leg - cues to perform slowly to improve SLS Buoyant ankle cuff used on each leg - 10 reps for hip strengthening - flexion, extension, abduction, hip and knee flexion/extension with knee flexed  Sidestepping with small squats 2571m1 rep with bar bells in each hand for increased resistance for upper body/UE strengthening  Squats x 10 reps with minimal UE support on pool edge   Pt requires the bouyancy of water for support and for reduced fall risk with balance and unsupported standing activities; buoyancy of water also needed for  spinal decompression for reduced pain with weight bearing activities.  Viscosity of water needed for resistance for strengthening exercises.                             PT Short Term Goals - 05/03/19 2016      PT SHORT TERM GOAL #1   Title  Pt will be independent with initial HEP for improved strength, balance, and gait. ALL STGS DUE 04/06/19    Time  4   due to delay in scheduling   Period  Weeks    Status  Achieved    Target Date  04/06/19      PT SHORT TERM GOAL #2   Title  Patient  will undergo further assessment of TUG with use of RW vs. BERG  in order to determine fall risk - goal written as appropriate.    Baseline  performed TUG with RW    Time  4    Period  Weeks    Status  Achieved      PT SHORT TERM GOAL #3   Title  Patient will verbalize understanding of fall prevention strategies in the home.    Time  4    Period  Weeks    Status  Deferred      PT SHORT TERM GOAL #4   Title  Patient will ambulate at least 50'32ith RW with supervision in order to safely ambulate at home.    Baseline  pt has ambulated 115' in clinic with min guard    Time  4    Period  Weeks    Status  Partially Met      PT SHORT TERM GOAL #5   Title  Patient will perform 4 steps with supervision using single railing and step to pattern in order to increase safety at home with stairs.    Baseline  4 steps with supervision/min guard with BUE support on railings and step to pattern.    Time  4  Period  Weeks    Status  Not Met      PT SHORT TERM GOAL #6   Title  Perform 5x sit to stand from standard arm chair vs. mat table and write goal as appropriate.    Baseline  met on 04/08/19    Time  4    Period  Weeks    Status  Achieved        PT Long Term Goals - 05/03/19 2016      PT LONG TERM GOAL #1   Title  Pt will be independent with final HEP for land/aquatic therapy for improved strength, ROM, balance, and gait. ALL LTGS DUE 06/24/19    Baseline  pt independent with HEP for land therapy - not quite yet for aquatic therapy (has only had a couple sessions)    Time  8   due to delay in scheduling   Period  Weeks    Status  On-going      PT LONG TERM GOAL #2   Title  Pt will decr TUG score with RW to 21 seconds or less with RW to decr fall risk.    Baseline  23.66 seconds with RW on 04/29/19    Time  8    Period  Weeks    Status  Revised      PT LONG TERM GOAL #3   Title  Patient will ambulate at least 150' with RW with mod I in order to safely ambulate at home.    Baseline   115' throughout with RW with supervision/min guard    Time  8    Period  Weeks    Status  On-going      PT LONG TERM GOAL #4   Title  Patient will perform 12 steps with mod I  using BUE suport on railing and step to pattern in order to increase safety at home with stairs.    Baseline  8 steps with supervision with B railings and step to pattern    Time  8    Period  Weeks    Status  On-going      PT LONG TERM GOAL #5   Title  Pt will undergo assessment of gait speed with RW- goal to be written as appropriate.    Time  8    Period  Weeks    Status  New      PT LONG TERM GOAL #6   Title  Pt will decr 5x sit <> stand time from 28 seconds or less from mat table using BUE support to demo improved functional LE strength.   31 seconds on 04/29/19   Time  8    Period  Weeks    Status  Not Met            Plan - 05/03/19 2012    Clinical Impression Statement  Pt tolerated aquatic exercises well with few rest breaks required.  Pt amb. from his power wheelchair (parked on side of pool area) to steps to enter into pool (approx. 46') with steady gait pattern with no LOB noted.  Pt is progressing well towards goals.    PT Treatment/Interventions  ADLs/Self Care Home Management;Neuromuscular re-education;Therapeutic activities;Therapeutic exercise;Patient/family education;Manual techniques;Scar mobilization;Passive range of motion;Manual lymph drainage;Electrical Stimulation;Aquatic Therapy;Energy conservation;Gait training;Stair training;Functional mobility training;DME Instruction;Balance training;Orthotic Fit/Training    PT Next Visit Plan  Rt wrist sx 05-11-19:   fall prevention strategies in the home. continue LE strengthening, gait training (walking around  obstacles),  standing balance in // bars, fall prevention strategy handout. Nustep for strengthening/endurance. core strengthening       Patient will benefit from skilled therapeutic intervention in order to improve the following  deficits and impairments:     Visit Diagnosis: Muscle weakness (generalized)  Other abnormalities of gait and mobility     Problem List Patient Active Problem List   Diagnosis Date Noted  . Urinary dysfunction 04/12/2019  . Osteoarthritis of right knee 08/23/2018  . Constipation due to opioid therapy 03/30/2018  . Retinopathy of both eyes 01/06/2018  . SNHL (sensorineural hearing loss) 12/04/2017  . Abnormal urinary stream 12/03/2017  . Osteoarthritis of carpometacarpal (CMC) joint of thumb 11/30/2017  . Muscle weakness 11/17/2017  . Transient vision disturbance 11/12/2017  . Bilateral hand pain 10/30/2017  . Pain of left hip joint 10/09/2017  . Gynecomastia 07/10/2017  . Iron deficiency anemia 07/05/2017  . Spasticity 05/20/2017  . Lumbar radiculitis 04/20/2017  . Ulnar 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, left 11/28/2016  . Idiopathic peripheral neuropathy 11/28/2016  . 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 Long term (current) use of anticoagulants 08/23/2015  . Right upper quadrant abdominal pain 08/23/2015  . Memory loss 05/10/2015  . Gait abnormality 04/07/2015  . Alkaline phosphatase elevation 04/07/2015  . History of thrombosis 03/26/2015  . Medial epicondylitis 02/07/2015  . Cognitive decline 12/21/2014  . Leukopenia 12/05/2014  . Rotator cuff syndrome of right shoulder 10/27/2014  . Status post left knee replacement 08/22/2014  . Left lateral epicondylitis 08/22/2014  . Chronic cerebral ischemia 08/18/2014  . Arthrofibrosis of knee joint 08/17/2014  . Cubital canal compression syndrome, left 08/17/2014  . 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 Chronic pain syndrome 04/10/2014  . Insomnia 04/10/2014  . Chronic non-specific white matter lesions on MRI 04/10/2014  . CFS (chronic fatigue syndrome) 04/10/2014  . 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 Biceps tendonitis on left 03/01/2014  . Polycythemia vera ([place]) 12/27/2013  . H/O TIA (transient ischemic  attack) and stroke 12/27/2013  . Neck pain 12/27/2013  . OSA (obstructive sleep apnea) 12/08/2013  . 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 Depression with anxiety 08/04/2013  . Protein C deficiency ([place]) 08/01/2013  . Post traumatic stress disorder (PTSD) 08/01/2013  . Speech abnormality 07/25/2013  . Obesity 05/10/2013  . Lower extremity edema 05/10/2013  . GERD (gastroesophageal reflux disease) 05/10/2013  . Arthritis 05/10/2013  . OA (osteoarthritis) of knee 03/15/2013  . Headache 10/25/2012  . Palpitations 10/18/2012  . Fatty liver determined by biopsy 06/01/2012  . Arthropathic psoriasis, unspecified ([place]) 04/29/2012  . Abnormal liver enzymes 03/29/2012  . Psoriatic arthritis ([place]) 12/29/2011  . Left Renal Hydronephrosis 12/11/2010  . 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 Celiac disease 05/27/2010  . Thyroid nodule 05/27/2010  . Male-to-male transgender person 09/20/2002    Alda [name], [place], [place] 05/03/2019, 8:20 PM  [facility] [address] [place], [place], [address] Phone: [phone]   Fax:  [phone]  Name: Jesse Bowers MRN: [id number] Date of Birth: [birth year]

## 2019-05-04 ENCOUNTER — Other Ambulatory Visit: Payer: Self-pay

## 2019-05-04 ENCOUNTER — Ambulatory Visit (HOSPITAL_COMMUNITY): Payer: BC Managed Care – PPO | Attending: Cardiovascular Disease

## 2019-05-04 DIAGNOSIS — I5032 Chronic diastolic (congestive) heart failure: Secondary | ICD-10-CM | POA: Diagnosis present

## 2019-05-05 ENCOUNTER — Ambulatory Visit (INDEPENDENT_AMBULATORY_CARE_PROVIDER_SITE_OTHER): Payer: BC Managed Care – PPO | Admitting: Psychology

## 2019-05-05 DIAGNOSIS — F4312 Post-traumatic stress disorder, chronic: Secondary | ICD-10-CM | POA: Diagnosis not present

## 2019-05-09 ENCOUNTER — Ambulatory Visit: Payer: BC Managed Care – PPO | Admitting: Physical Therapy

## 2019-05-09 ENCOUNTER — Telehealth: Payer: Self-pay | Admitting: *Deleted

## 2019-05-09 DIAGNOSIS — R2689 Other abnormalities of gait and mobility: Secondary | ICD-10-CM

## 2019-05-09 DIAGNOSIS — M6281 Muscle weakness (generalized): Secondary | ICD-10-CM

## 2019-05-09 DIAGNOSIS — R2681 Unsteadiness on feet: Secondary | ICD-10-CM

## 2019-05-09 NOTE — Telephone Encounter (Signed)
Prior authorization submitted for morphine sulfate 60 mg ER capsules.  Approved  05/02/2019 - 11/01/2019

## 2019-05-10 ENCOUNTER — Ambulatory Visit (INDEPENDENT_AMBULATORY_CARE_PROVIDER_SITE_OTHER): Payer: BC Managed Care – PPO | Admitting: Psychology

## 2019-05-10 ENCOUNTER — Encounter: Payer: Self-pay | Admitting: Physical Therapy

## 2019-05-10 ENCOUNTER — Other Ambulatory Visit: Payer: Self-pay

## 2019-05-10 ENCOUNTER — Telehealth: Payer: Self-pay | Admitting: *Deleted

## 2019-05-10 DIAGNOSIS — F4312 Post-traumatic stress disorder, chronic: Secondary | ICD-10-CM

## 2019-05-10 NOTE — Telephone Encounter (Signed)
   [facility] Pre-operative Risk Assessment    Request for surgical clearance:  1. What type of surgery is being performed? RIGHT WRIST VOLAR GANGLION EXCISION AND RIGHT THUMB   2. When is this surgery scheduled? 05/11/19   3. What type of clearance is required (medical clearance vs. Pharmacy clearance to hold med vs. Both)? BOTH  4. Are there any medications that need to be held prior to surgery and how long? [place]   5. Practice name and name of physician performing surgery? [facility]; DR. Ronneby   6. What is your office phone number [phone]    7.   What is your office fax number [phone]  8.   Anesthesia type (None, local, MAC, general) ? REGIONAL BLOCK W/IV SEDATION   Julaine Hua 05/10/2019, 4:01 PM  _________________________________________________________________   (provider comments below)

## 2019-05-10 NOTE — Therapy (Signed)
[facility] [address] [place], [place], [address] Phone: [phone]   Fax:  [phone]  Physical Therapy Treatment  Patient Details  Name: Jesse Bowers MRN: [id number] Date of Birth: 07-Dec-1965 Referring Provider (PT): Clerance Lav, MD   Encounter Date: 05/09/2019  PT End of Session - 05/10/19 2339    Visit Number  15    Number of Visits  24   visit 1 in March   Date for PT Re-Evaluation  06/28/19    Authorization Type  [employer] primary -September 2020-Feb 28th 2021, [facility] secondary (however may lose part B) - 30 visit limit combined PT/OT (Same day counts as 1 visit)    Authorization - Visit Number  18   visit 2 in March   Authorization - Number of Visits  30    PT Start Time  1330    PT Stop Time  1415    PT Time Calculation (min)  45 min    Equipment Utilized During Treatment  Other (comment)   ankle cuffs, bar bells   Activity Tolerance  Patient tolerated treatment well    Behavior During Therapy  [facility] for tasks assessed/performed       Past Medical History:  Diagnosis Date  . Abnormal weight loss   . Anxiety   . Arthritis   . Cataract    OU  . Celiac disease   . Cervical neck pain with evidence of disc disease    patient has a cyst   . Chronic constipation   . Chronic diastolic heart failure ([place])    Pt. denies  . Chronic pain   . Degenerative disc disease at L5-S1 level    with stenosis  . DVT (deep venous thrombosis) (HCC)    Right upper arm, bilateral leg  . Eczema    inguinal, feet  . Elevated liver enzymes   . 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 Family history of adverse reaction to anesthesia    family has problems with anesthesia of nausea and vomiting   . 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 GERD (gastroesophageal reflux disease)    History of in 20's  . Gluten enteropathy   . H/O parotitis    right   . Hard of hearing   . History of kidney stones   . History of  retinal tear    Bilateral  . History of staph infection    required wound vac  . Hx-TIA (transient ischemic attack)    2015  . LVH (left ventricular hypertrophy) 12/15/2016   Mild, noted on ECHO  . MVP (mitral valve prolapse)   . NAFL (nonalcoholic fatty liver)   . Neck pain   . Neuromuscular disorder ([place])    bilateral neuropathy feet.  . Pneumonia 12/17/2010  . Polycythemia   . Polycythemia, secondary   . PONV (postoperative nausea and vomiting)   . Protein C deficiency ([place])    Dr. Anne Fu  . Psoriasis    16 X10 cm psoriatic rash on sole of left foot ; open and occ scant bleeding;   . psoriatic arthritis   . PTSD (post-traumatic stress disorder)   . Scaphoid fracture of wrist 09/23/2013  . Seizure ([place])    childhood, medication until age 61 then weanned completely off  . Sleep apnea    split night study last done by Dr. [name] 06/18/15 shows severe OSA, CSA, and hypersomnia, rec bipap  . Splenomegaly   . Stenosis of ureteropelvic junction (  UPJ)    left  . Stroke [facility])    CVA vs TIA in left cerebrum causing slight right sided weakness-Dr. [name] follows  . Syrinx of spinal cord ([place]) 01/06/2014   c spine on MRI  . Tachycardia    hx of   . Transfusion history    past history- none recent, after surgeries due to blood loss  . Wears glasses   . Wears hearing aid     Past Surgical History:  Procedure Laterality Date  . ABDOMINAL HYSTERECTOMY Bilateral 1994   TAH, BSO- tranverse incision at 54 yo  . ANKLE ARTHROSCOPY WITH RECONSTRUCTION Right 2007  . CHOLECYSTECTOMY     laparoscopic  . COLONOSCOPY     x3  . EYE SURGERY     Left eye 03/02/2018, right 02/15/2018  . HIP ARTHROSCOPY W/ LABRAL REPAIR Right 05/11/2013   acetabular labral tear 03/30/2013  . KNEE ARTHROPLASTY Right   . KNEE JOINT MANIPULATION Left    x3 under anesthesia  . KNEE SURGERY Bilateral 1984   Right ACL, left PCL repair  . LITHOTRIPSY  2005  . LIVER BIOPSY  2013   normal results.  [name]  MASTECTOMY Bilateral    prior to 2009  . MOUTH SURGERY    . NASAL SEPTUM SURGERY N/A 09/20/2015   by ENT Dr. [name]  . OVARIAN CYST SURGERY Left    size of grapefruit, was informed that she had shortened vagina  . SHOULDER SURGERY Bilateral    Right 08/15/2016, Left 11/15/2016  . THUMB ARTHROSCOPY Left   . THYROIDECTOMY, PARTIAL Left 2008  . TOTAL KNEE ARTHROPLASTY Right 08/23/2018   Procedure: TOTAL KNEE ARTHROPLASTY;  Surgeon: [name], MD;  Location: [facility];  Service: Orthopedics;  Laterality: Right;  23mn  . TOTAL KNEE REVISION Left 02/06/2016   Procedure: LEFT TOTAL KNEE REVISION;  Surgeon: FGaynelle Arabian MD;  Location: [facility];  Service: Orthopedics;  Laterality: Left;  . TOTAL KNEE REVISION Left 04/22/2017   Procedure: Left knee polyethylene revision;  Surgeon: AGaynelle Arabian MD;  Location: [facility];  Service: Orthopedics;  Laterality: Left;  . UPPER GI ENDOSCOPY  2003    There were no vitals filed for this visit.  Subjective Assessment - 05/10/19 2326    Subjective  [name] is having surgery on his Rt wrist on Wed. 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 PMH: Psoriatic arthritis, Polycythemia vera, CVA, s/p Rt TKR 08/23/18, Lt knee revision 04/2017, lumbar radiculitis, multiple scaphoid fx's Rt thumb, chronic pain    How long can you walk comfortably?  can walk around the house - furniture walking    Diagnostic tests  MRI cervical spine 07/2017: Unchanged C4 through C6 cervical spinal cord syrinx since the MRIin March 2016, and stable by report since October 2015., MRI brain 2019: No acute intracranial abnormality and stable noncontrast MRI appearance of the brain since 2017.    Patient Stated Goals  can't get up without using his hands, wants to be able to get out of his chair without using his hands - less pain in legs and back, be stronger.    Currently in Pain?  Yes    Pain  Score  5     Pain Location  Wrist    Pain Orientation  Right    Pain Descriptors / Indicators  Aching;Sore    Pain Type  Chronic pain  Pain Onset  More than a month ago    Pain Frequency  Constant             Aquatic therapy at [facility] - pool temp. 87.2 degrees  Patient seen for aquatic therapy today.  Treatment took place in water 2.5-4 feet deep depending upon activity.  Pt entered and exited  the pool via ramp negotiation with use Hand rails with supervision   Pt performed runner's stretch for hamstring and heel cord stretching - 30 sec hold 2 reps on RLE & LLE  Pt gait trained in pool - 56mx 4 reps; bar bells used on 4th rep for increased resistance for strengthening and also to fascilitate reciprocal arm swing in gait   Pt performed marching in place 10 reps each leg - cues to perform slowly to improve SLS Buoyant ankle cuff used on each leg - 15 reps for hip strengthening - flexion, extension, abduction, hip and knee flexion/extension with knee flexed    Pt requires the bouyancy of water for support and for reduced fall risk with balance and unsupported standing activities; buoyancy of water also needed for  spinal decompression for reduced pain with weight bearing activities.  Viscosity of water needed for resistance for strengthening exercises.                                                PT Short Term Goals - 05/10/19 2342      PT SHORT TERM GOAL #1   Title  Pt will be independent with initial HEP for improved strength, balance, and gait. ALL STGS DUE 04/06/19    Time  4   due to delay in scheduling   Period  Weeks    Status  Achieved    Target Date  04/06/19      PT SHORT TERM GOAL #2   Title  Patient will undergo further assessment of TUG with use of RW vs. BERG  in order to determine fall risk - goal written as appropriate.    Baseline  performed TUG with RW    Time  4    Period  Weeks    Status  Achieved      PT  SHORT TERM GOAL #3   Title  Patient will verbalize understanding of fall prevention strategies in the home.    Time  4    Period  Weeks    Status  Deferred      PT SHORT TERM GOAL #4   Title  Patient will ambulate at least 540 with RW with supervision in order to safely ambulate at home.    Baseline  pt has ambulated 115' in clinic with min guard    Time  4    Period  Weeks    Status  Partially Met      PT SHORT TERM GOAL #5   Title  Patient will perform 4 steps with supervision using single railing and step to pattern in order to increase safety at home with stairs.    Baseline  4 steps with supervision/min guard with BUE support on railings and step to pattern.    Time  4    Period  Weeks    Status  Not Met      PT SHORT TERM GOAL #6   Title  Perform 5x sit to stand  from standard arm chair vs. mat table and write goal as appropriate.    Baseline  met on 04/08/19    Time  4    Period  Weeks    Status  Achieved        PT Long Term Goals - 05/10/19 2342      PT LONG TERM GOAL #1   Title  Pt will be independent with final HEP for land/aquatic therapy for improved strength, ROM, balance, and gait. ALL LTGS DUE 06/24/19    Baseline  pt independent with HEP for land therapy - not quite yet for aquatic therapy (has only had a couple sessions)    Time  8   due to delay in scheduling   Period  Weeks    Status  On-going      PT LONG TERM GOAL #2   Title  Pt will decr TUG score with RW to 21 seconds or less with RW to decr fall risk.    Baseline  23.66 seconds with RW on 04/29/19    Time  8    Period  Weeks    Status  Revised      PT LONG TERM GOAL #3   Title  Patient will ambulate at least 150' with RW with mod I in order to safely ambulate at home.    Baseline  115' throughout with RW with supervision/min guard    Time  8    Period  Weeks    Status  On-going      PT LONG TERM GOAL #4   Title  Patient will perform 12 steps with mod I  using BUE suport on railing and step to  pattern in order to increase safety at home with stairs.    Baseline  8 steps with supervision with B railings and step to pattern    Time  8    Period  Weeks    Status  On-going      PT LONG TERM GOAL #5   Title  Pt will undergo assessment of gait speed with RW- goal to be written as appropriate.    Time  8    Period  Weeks    Status  New      PT LONG TERM GOAL #6   Title  Pt will decr 5x sit <> stand time from 28 seconds or less from mat table using BUE support to demo improved functional LE strength.   31 seconds on 04/29/19   Time  8    Period  Weeks    Status  Not Met            Plan - 05/10/19 2340    Clinical Impression Statement  Pt tolerated aquatic exercises well; pt did report some mild wrist pain bil. after holding bar bells in each hand for resistive exercises in water; pt reported pain quickly subsided after use of bar bells was discontinued    PT Treatment/Interventions  ADLs/Self Care Home Management;Neuromuscular re-education;Therapeutic activities;Therapeutic exercise;Patient/family education;Manual techniques;Scar mobilization;Passive range of motion;Manual lymph drainage;Electrical Stimulation;Aquatic Therapy;Energy conservation;Gait training;Stair training;Functional mobility training;DME Instruction;Balance training;Orthotic Fit/Training    PT Next Visit Plan  Rt wrist sx 05-11-19:   fall prevention strategies in the home. continue LE strengthening, gait training (walking around obstacles),  standing balance in // bars, fall prevention strategy handout. Nustep for strengthening/endurance. core strengthening       Patient will benefit from skilled therapeutic intervention in order to improve the following deficits and impairments:  Visit Diagnosis: Muscle weakness (generalized)  Other abnormalities of gait and mobility  Unsteadiness on feet     Problem List Patient Active Problem List   Diagnosis Date Noted  . Urinary dysfunction 04/12/2019  .  Osteoarthritis of right knee 08/23/2018  . Constipation due to opioid therapy 03/30/2018  . Retinopathy of both eyes 01/06/2018  . SNHL (sensorineural hearing loss) 12/04/2017  . Abnormal urinary stream 12/03/2017  . Osteoarthritis of carpometacarpal (CMC) joint of thumb 11/30/2017  . Muscle weakness 11/17/2017  . Transient vision disturbance 11/12/2017  . Bilateral hand pain 10/30/2017  . Pain of left hip joint 10/09/2017  . Gynecomastia 07/10/2017  . Iron deficiency anemia 07/05/2017  . Spasticity 05/20/2017  . Lumbar radiculitis 04/20/2017  . Ulnar 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, left 11/28/2016  . Idiopathic peripheral neuropathy 11/28/2016  . 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 Long term (current) use of anticoagulants 08/23/2015  . Right upper quadrant abdominal pain 08/23/2015  . Memory loss 05/10/2015  . Gait abnormality 04/07/2015  . Alkaline phosphatase elevation 04/07/2015  . History of thrombosis 03/26/2015  . Medial epicondylitis 02/07/2015  . Cognitive decline 12/21/2014  . Leukopenia 12/05/2014  . Rotator cuff syndrome of right shoulder 10/27/2014  . Status post left knee replacement 08/22/2014  . Left lateral epicondylitis 08/22/2014  . Chronic cerebral ischemia 08/18/2014  . Arthrofibrosis of knee joint 08/17/2014  . Cubital canal compression syndrome, left 08/17/2014  . 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 Chronic pain syndrome 04/10/2014  . Insomnia 04/10/2014  . Chronic non-specific white matter lesions on MRI 04/10/2014  . CFS (chronic fatigue syndrome) 04/10/2014  . 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 Biceps tendonitis on left 03/01/2014  . Polycythemia vera ([place]) 12/27/2013  . H/O TIA (transient ischemic attack) and stroke 12/27/2013  . Neck pain 12/27/2013  . OSA (obstructive sleep apnea) 12/08/2013  . 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 Depression with anxiety 08/04/2013  . Protein C deficiency ([place]) 08/01/2013  . Post  traumatic stress disorder (PTSD) 08/01/2013  . Speech abnormality 07/25/2013  . Obesity 05/10/2013  . Lower extremity edema 05/10/2013  . GERD (gastroesophageal reflux disease) 05/10/2013  . Arthritis 05/10/2013  . OA (osteoarthritis) of knee 03/15/2013  . Headache 10/25/2012  . Palpitations 10/18/2012  . Fatty liver determined by biopsy 06/01/2012  . Arthropathic psoriasis, unspecified ([place]) 04/29/2012  . Abnormal liver enzymes 03/29/2012  . Psoriatic arthritis ([place]) 12/29/2011  . Left Renal Hydronephrosis 12/11/2010  . 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 Celiac disease 05/27/2010  . Thyroid nodule 05/27/2010  . Male-to-male transgender person 09/20/2002    Alda [name], [place], [place] 05/10/2019, 11:45 PM  [facility] [address] [place], [place], [address] Phone: [phone]   Fax:  [phone]  Name: Etai Copado MRN: [id number] Date of Birth: [birth year]

## 2019-05-10 NOTE — Telephone Encounter (Signed)
   Primary Cardiologist: Sanda Klein, MD  Chart reviewed as part of pre-operative protocol coverage. Patient was contacted 05/10/2019 in reference to pre-operative risk assessment for pending surgery as outlined below.  Jesse Bowers was last seen on 04/27/19 by Dr. [name].  Since that day, [name] has done well. He has an echocardiogram on 05/04/19 that showed normal EF, grade 1 DD.   Therefore, based on ACC/AHA guidelines, the patient would be at acceptable risk for the planned procedure without further cardiovascular testing.   He is on Xarelto for prior stroke and protien C deficiency per Dr. Burr Medico (not cardiology). Dr. Burr Medico should provide input on whether this can be held. Today the patient says that this has been cleared and he has held his Xarelto since Sunday.   I will route this recommendation to the requesting party via Epic fax function and remove from pre-op pool.  Please call with questions.  Daune Perch, NP 05/10/2019, 4:25 PM

## 2019-05-11 ENCOUNTER — Ambulatory Visit: Payer: BC Managed Care – PPO | Admitting: Occupational Therapy

## 2019-05-11 ENCOUNTER — Ambulatory Visit: Payer: BC Managed Care – PPO | Admitting: Physical Therapy

## 2019-05-12 ENCOUNTER — Ambulatory Visit: Payer: BC Managed Care – PPO | Admitting: Psychology

## 2019-05-12 ENCOUNTER — Other Ambulatory Visit: Payer: BC Managed Care – PPO

## 2019-05-13 ENCOUNTER — Telehealth: Payer: Self-pay

## 2019-05-13 ENCOUNTER — Ambulatory Visit: Payer: BC Managed Care – PPO | Admitting: Physical Therapy

## 2019-05-13 NOTE — Telephone Encounter (Signed)
Patient notified

## 2019-05-13 NOTE — Telephone Encounter (Signed)
He May follow Dr. Apolonio Schneiders oxycodone prescription.

## 2019-05-13 NOTE — Telephone Encounter (Signed)
Patient called stating that he had hand surgery and Dr. Apolonio Schneiders gave him Oxycodone to take. He needs approval to stop medication prescribed by Danella Sensing and take Dr. Apolonio Schneiders prescription.

## 2019-05-17 ENCOUNTER — Ambulatory Visit (INDEPENDENT_AMBULATORY_CARE_PROVIDER_SITE_OTHER): Payer: BC Managed Care – PPO | Admitting: Psychology

## 2019-05-17 DIAGNOSIS — F4312 Post-traumatic stress disorder, chronic: Secondary | ICD-10-CM | POA: Diagnosis not present

## 2019-05-18 ENCOUNTER — Other Ambulatory Visit: Payer: Self-pay

## 2019-05-18 ENCOUNTER — Encounter: Payer: Self-pay | Admitting: Physical Medicine & Rehabilitation

## 2019-05-18 ENCOUNTER — Encounter
Payer: BC Managed Care – PPO | Attending: Physical Medicine & Rehabilitation | Admitting: Physical Medicine & Rehabilitation

## 2019-05-18 VITALS — BP 114/76 | HR 105 | Temp 98.5°F | Ht 69.0 in | Wt 208.7 lb

## 2019-05-18 DIAGNOSIS — K900 Celiac disease: Secondary | ICD-10-CM | POA: Insufficient documentation

## 2019-05-18 DIAGNOSIS — G894 Chronic pain syndrome: Secondary | ICD-10-CM | POA: Insufficient documentation

## 2019-05-18 DIAGNOSIS — Z5181 Encounter for therapeutic drug level monitoring: Secondary | ICD-10-CM | POA: Diagnosis not present

## 2019-05-18 DIAGNOSIS — D6859 Other primary thrombophilia: Secondary | ICD-10-CM | POA: Diagnosis not present

## 2019-05-18 DIAGNOSIS — S43002A Unspecified subluxation of left shoulder joint, initial encounter: Secondary | ICD-10-CM | POA: Diagnosis not present

## 2019-05-18 DIAGNOSIS — R209 Unspecified disturbances of skin sensation: Secondary | ICD-10-CM | POA: Diagnosis not present

## 2019-05-18 DIAGNOSIS — D751 Secondary polycythemia: Secondary | ICD-10-CM | POA: Diagnosis not present

## 2019-05-18 DIAGNOSIS — Z86718 Personal history of other venous thrombosis and embolism: Secondary | ICD-10-CM | POA: Insufficient documentation

## 2019-05-18 DIAGNOSIS — M7522 Bicipital tendinitis, left shoulder: Secondary | ICD-10-CM | POA: Diagnosis not present

## 2019-05-18 DIAGNOSIS — R252 Cramp and spasm: Secondary | ICD-10-CM | POA: Insufficient documentation

## 2019-05-18 DIAGNOSIS — M545 Low back pain: Secondary | ICD-10-CM | POA: Insufficient documentation

## 2019-05-18 DIAGNOSIS — T84018S Broken internal joint prosthesis, other site, sequela: Secondary | ICD-10-CM

## 2019-05-18 DIAGNOSIS — Z87890 Personal history of sex reassignment: Secondary | ICD-10-CM | POA: Diagnosis not present

## 2019-05-18 DIAGNOSIS — Z7901 Long term (current) use of anticoagulants: Secondary | ICD-10-CM | POA: Insufficient documentation

## 2019-05-18 DIAGNOSIS — M5416 Radiculopathy, lumbar region: Secondary | ICD-10-CM | POA: Insufficient documentation

## 2019-05-18 DIAGNOSIS — I69351 Hemiplegia and hemiparesis following cerebral infarction affecting right dominant side: Secondary | ICD-10-CM | POA: Diagnosis not present

## 2019-05-18 DIAGNOSIS — L4050 Arthropathic psoriasis, unspecified: Secondary | ICD-10-CM | POA: Diagnosis present

## 2019-05-18 DIAGNOSIS — I69398 Other sequelae of cerebral infarction: Secondary | ICD-10-CM | POA: Insufficient documentation

## 2019-05-18 DIAGNOSIS — Z79891 Long term (current) use of opiate analgesic: Secondary | ICD-10-CM | POA: Diagnosis not present

## 2019-05-18 DIAGNOSIS — Z9181 History of falling: Secondary | ICD-10-CM | POA: Insufficient documentation

## 2019-05-18 DIAGNOSIS — G8929 Other chronic pain: Secondary | ICD-10-CM | POA: Diagnosis present

## 2019-05-18 DIAGNOSIS — F418 Other specified anxiety disorders: Secondary | ICD-10-CM | POA: Insufficient documentation

## 2019-05-18 DIAGNOSIS — Z79899 Other long term (current) drug therapy: Secondary | ICD-10-CM | POA: Diagnosis not present

## 2019-05-18 DIAGNOSIS — I69320 Aphasia following cerebral infarction: Secondary | ICD-10-CM | POA: Insufficient documentation

## 2019-05-18 DIAGNOSIS — G609 Hereditary and idiopathic neuropathy, unspecified: Secondary | ICD-10-CM

## 2019-05-18 DIAGNOSIS — M13862 Other specified arthritis, left knee: Secondary | ICD-10-CM | POA: Diagnosis not present

## 2019-05-18 DIAGNOSIS — Z96659 Presence of unspecified artificial knee joint: Secondary | ICD-10-CM

## 2019-05-18 MED ORDER — MORPHINE SULFATE ER 60 MG PO CP24: 60.0000 mg | ORAL_CAPSULE | Freq: Two times a day (BID) | ORAL | 0 refills | Status: DC

## 2019-05-18 MED ORDER — OXYCODONE HCL 10 MG PO TABS: 10.0000 mg | ORAL_TABLET | Freq: Three times a day (TID) | ORAL | 0 refills | Status: DC | PRN

## 2019-05-18 NOTE — Patient Instructions (Signed)
PLEASE FEEL FREE TO CALL OUR OFFICE WITH ANY PROBLEMS OR QUESTIONS ([phone])      

## 2019-05-18 NOTE — Progress Notes (Signed)
[name]   [phone] [phone]   Clinic Follow up Note   Patient Care Team: Shawnee Knapp, MD as PCP - General (Family Medicine) Sanda Klein, MD as PCP - Cardiology (Cardiology) Bernadene Bell, MD (Inactive) as Consulting Physician (Hematology) Sheryn Bison, MD as Referring Physician (Dermatology) Elease Hashimoto, Trixie Deis, MD as Referring Physician (Gastroenterology) [name], MD as Consulting Physician (Hematology) Rozetta Nunnery, MD as Consulting Physician (Otolaryngology) Doree Fudge, PhD as Consulting Physician (Psychology) Lendon Colonel, MD as Referring Physician (Psychiatry) Meredith Staggers, MD as Consulting Physician (Physical Medicine and Rehabilitation) Hermelinda Medicus, MD as Consulting Physician (Rheumatology) [name], MD as Consulting Physician (Orthopedic Surgery) Justice Britain, MD as Consulting Physician (Orthopedic Surgery) Hollice Espy, MD as Consulting Physician (Urology) Shawnee Knapp, MD (Family Medicine)  Date of Service:  05/23/2019  CHIEF COMPLAINT: F/u on DVT and polycythemia   CURRENT THERAPY:  Xarelto 20 mg once daily, phlebotomy if hematocrit above 46%  INTERVAL HISTORY:  Jesse Bowers is here for a follow up of DVT and PV. He presents to the clinic alone. He presents to the clinic alone. He notes he had right wrist/hand surgery on 05/11/18 for bone cyst on Jesse Bowers joint. He notes he feels tired as it gets warmer. He notes he has been sweating more. He still has cast on right wrist and has not been able to use walker to ambulate. He notes his mammogram was set for the day after surgery so he canceled. He plans to get COVID vaccine next month.    REVIEW OF SYSTEMS:   Constitutional: Denies fevers, chills or abnormal weight loss Eyes: Denies blurriness of vision Ears, nose, mouth, throat, and face: Denies mucositis or sore throat Respiratory: Denies cough, dyspnea or wheezes Cardiovascular:  Denies palpitation, chest discomfort or lower extremity swelling Gastrointestinal:  Denies nausea, heartburn or change in bowel habits Skin: Denies abnormal skin rashes MSK: (+) S/p right wrist surgery, cast on   Lymphatics: Denies new lymphadenopathy or easy bruising Neurological:Denies numbness, tingling or new weaknesses Behavioral/Psych: Mood is stable, no new changes  All other systems were reviewed with the patient and are negative.  MEDICAL HISTORY:  Past Medical History:  Diagnosis Date  . Abnormal weight loss   . Anxiety   . Arthritis   . Cataract    OU  . Celiac disease   . Cervical neck pain with evidence of disc disease    patient has a cyst   . Chronic constipation   . Chronic diastolic heart failure (Jesse Bowers)    Pt. denies  . Chronic pain   . Degenerative disc disease at L5-S1 level    with stenosis  . DVT (deep venous thrombosis) (H[facility])    Right upper arm, bilateral leg  . Eczema    inguinal, feet  . Elevated liver enzymes   . 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 Family history of adverse reaction to anesthesia    family has problems with anesthesia of nausea and vomiting   . 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 GERD (gastroesophageal reflux disease)    History of in 20's  . Gluten enteropathy   . H/O parotitis    right   . Hard of hearing   . History of kidney stones   . History of retinal tear    Bilateral  . History of staph infection    required wound vac  . Hx-TIA (transient ischemic attack)    2015  .  LVH (left ventricular hypertrophy) 12/15/2016   Mild, noted on ECHO  . MVP (mitral valve prolapse)   . NAFL (nonalcoholic fatty liver)   . Neck pain   . Neuromuscular disorder ([place])    bilateral neuropathy feet.  . Pneumonia 12/17/2010  . Polycythemia   . Polycythemia, secondary   . PONV (postoperative nausea and vomiting)   . Protein C deficiency ([place])    Dr. Anne Fu  . Psoriasis    16 X10 cm psoriatic rash on sole of left  foot ; open and occ scant bleeding;   . psoriatic arthritis   . PTSD (post-traumatic stress disorder)   . Scaphoid fracture of wrist 09/23/2013  . Seizure ([place])    childhood, medication until age 89 then weanned completely off  . Sleep apnea    split night study last done by Dr. [name] 06/18/15 shows severe OSA, CSA, and hypersomnia, rec bipap  . Splenomegaly   . Stenosis of ureteropelvic junction (UPJ)    left  . Stroke [facility])    CVA vs TIA in left cerebrum causing slight right sided weakness-Dr. [name] follows  . 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 Tachycardia    hx of   . Transfusion history    past history- none recent, after surgeries due to blood loss  . Wears glasses   . Wears hearing aid     SURGICAL HISTORY: Past Surgical History:  Procedure Laterality Date  . ABDOMINAL HYSTERECTOMY Bilateral 1994   TAH, BSO- tranverse incision at 54 yo  . ANKLE ARTHROSCOPY WITH RECONSTRUCTION Right 2007  . CHOLECYSTECTOMY     laparoscopic  . COLONOSCOPY     x3  . EYE SURGERY     Left eye 03/02/2018, right 02/15/2018  . HIP ARTHROSCOPY W/ LABRAL REPAIR Right 05/11/2013   acetabular labral tear 03/30/2013  . KNEE ARTHROPLASTY Right   . KNEE JOINT MANIPULATION Left    x3 under anesthesia  . KNEE SURGERY Bilateral 1984   Right ACL, left PCL repair  . LITHOTRIPSY  2005  . LIVER BIOPSY  2013   normal results.  [name] MASTECTOMY Bilateral    prior to 2009  . MOUTH SURGERY    . NASAL SEPTUM SURGERY N/A 09/20/2015   by ENT Dr. [name]  . OVARIAN CYST SURGERY Left    size of grapefruit, was informed that she had shortened vagina  . SHOULDER SURGERY Bilateral    Right 08/15/2016, Left 11/15/2016  . THUMB ARTHROSCOPY Left   . THYROIDECTOMY, PARTIAL Left 2008  . TOTAL KNEE ARTHROPLASTY Right 08/23/2018   Procedure: TOTAL KNEE ARTHROPLASTY;  Surgeon: [name], MD;  Location: [facility];  Service: Orthopedics;  Laterality: Right;  19mn  . TOTAL KNEE REVISION Left  02/06/2016   Procedure: LEFT TOTAL KNEE REVISION;  Surgeon: FGaynelle Arabian MD;  Location: [facility];  Service: Orthopedics;  Laterality: Left;  . TOTAL KNEE REVISION Left 04/22/2017   Procedure: Left knee polyethylene revision;  Surgeon: AGaynelle Arabian MD;  Location: [facility];  Service: Orthopedics;  Laterality: Left;  . UPPER GI ENDOSCOPY  2003    I have reviewed the social history and family history with the patient and they are unchanged from previous note.  ALLERGIES:  is allergic to gabapentin; penicillin g; sulfa antibiotics; vancomycin; duloxetine; tegaderm ag mesh [silver]; wheat bran; ibuprofen; nortriptyline; pregabalin; and sulfacetamide sodium-sulfur.  MEDICATIONS:  Current Outpatient Medications  Medication Sig Dispense Refill  . cholecalciferol (VITAMIN D3) 25  MCG (1000 UNIT) tablet Take 1,000 Units by mouth daily.    . Cyanocobalamin (VITAMIN B 12 PO) Place 5,000 mcg under the tongue daily.    [name] acyclovir ointment (ZOVIRAX) 5 % Apply 1 application topically daily as needed (cold sores).   0  . antiseptic oral rinse (BIOTENE) LIQD 15 mLs by Mouth Rinse route as needed for dry mouth.    [name] augmented betamethasone dipropionate (DIPROLENE-AF) 0.05 % ointment Apply 1 application topically 2 (two) times daily.    . baclofen (LIORESAL) 20 MG tablet Take 1 tablet (20 mg total) by mouth 4 (four) times daily. 120 tablet 3  . Calcipotriene-Betameth Diprop (ENSTILAR) 0.005-0.064 % FOAM Apply 1 application topically 2 (two) times daily as needed.     . clobetasol (TEMOVATE) 0.05 % GEL Apply 1 application topically 2 (two) times daily as needed (itching).    . clorazepate (TRANXENE) 15 MG tablet Take 15 mg by mouth at bedtime.    . clorazepate (TRANXENE) 7.5 MG tablet Take 7.5 mg by mouth 2 (two) times daily.     [name] desonide (DESOWEN) 0.05 % ointment Apply 1 application topically 2 (two) times daily as needed (psoriasis).   2  . diclofenac sodium (VOLTAREN) 1 % GEL APPLY TOPICALLY TO BOTH HANDS 3  TIMES DAILY. (Patient taking differently: Apply 1 application topically 3 (three) times daily as needed. ) 300 g 4  . Emollient (CERAVE) CREA Apply 1 application topically 2 (two) times daily as needed.     . ergocalciferol (VITAMIN D2) 1.25 MG (50000 UT) capsule ergocalciferol (vitamin D2) 1,250 mcg (50,000 unit) capsule    . Fluocinolone Acetonide 0.01 % OIL Place 3 drops into both ears 2 (two) times daily as needed for itching.    . furosemide (LASIX) 20 MG tablet Take 1 tablet (20 mg total) by mouth daily. To be taken with the 40 mg to equal 60 mg daily 90 tablet 1  . furosemide (LASIX) 40 MG tablet Take 1 tablet (40 mg total) by mouth daily. To be taken with the 20 mg to equal 60 mg daily. 90 tablet 1  . morphine (KADIAN) 60 MG 24 hr capsule Take 1 capsule (60 mg total) by mouth every 12 (twelve) hours. 60 capsule 0  . NEEDLE, DISP, 18 G 18G X 1-1/2" MISC 1 Units by Does not apply route once a week. 50 each 2  . NEEDLE, DISP, 23 G 23G X 3/4" MISC 1 Units by Does not apply route once a week. 100 each 1  . neomycin-polymyxin-hydrocortisone (CORTISPORIN) 3.5-10000-1 OTIC suspension Place 4 drops into both ears 2 (two) times daily as needed (ear pain).   1  . Oxycodone HCl 10 MG TABS Take 1 tablet (10 mg total) by mouth every 8 (eight) hours as needed. 90 tablet 0  . potassium chloride (KLOR-CON) 10 MEQ tablet TAKE K-DUR 2 TABLETS 10 MEQ DAILY 60 tablet 11  . Propylene Glycol (SYSTANE COMPLETE) 0.6 % SOLN Place 1-2 drops into both eyes 2 (two) times daily as needed (dry eyes).     . rivaroxaban (XARELTO) 20 MG TABS tablet Take 1 tablet (20 mg total) by mouth at bedtime. TAKE 1 TABLET DAILY BEFORE BEDTIME 90 tablet 3  . Secukinumab (COSENTYX SENSOREADY PEN) 150 MG/ML SOAJ Inject 300 mg into the skin every 28 (twenty-eight) days.     . Syringe, Disposable, 1 ML MISC 1 Units by Does not apply route once a week. 60 each 2  . testosterone cypionate (DEPOTESTOSTERONE CYPIONATE) 200  MG/ML injection INJECT  0.5 MLS (100 MG TOTAL) INTO THE MUSCLE ONCE A WEEK. 10 mL 0  . ursodiol (ACTIGALL) 250 MG tablet Take 250 mg by mouth 2 (two) times daily with a meal.     . ursodiol (ACTIGALL) 500 MG tablet Take 500 mg by mouth 2 (two) times daily with a meal.     . venlafaxine XR (EFFEXOR-XR) 150 MG 24 hr capsule Take 2 capsules (300 mg total) by mouth daily with breakfast. 60 capsule 5   No current facility-administered medications for this visit.    PHYSICAL EXAMINATION: ECOG PERFORMANCE STATUS: 3 - Symptomatic, >50% confined to bed  Vitals:   05/23/19 1354  BP: 124/82  Pulse: (!) 102  Resp: 18  Temp: 98.7 F (37.1 C)  SpO2: 100%   Filed Weights   05/23/19 1354  Weight: 207 lb 9.6 oz (94.2 kg)    GENERAL:alert, no distress and comfortable SKIN: skin color, texture, turgor are normal, no rashes or significant lesions EYES: normal, Conjunctiva are pink and non-injected, sclera clear  NECK: supple, thyroid normal size, non-tender, without nodularity LYMPH:  no palpable lymphadenopathy in the cervical, axillary  LUNGS: clear to auscultation and percussion with normal breathing effort HEART: regular rate & rhythm and no murmurs and no lower extremity edema ABDOMEN:abdomen soft, non-tender and normal bowel sounds Musculoskeletal:no cyanosis of digits and no clubbing (+) cast on right wrist/hand NEURO: alert & oriented x 3 with fluent speech, no focal motor/sensory deficits  LABORATORY DATA:  I have reviewed the data as listed CBC Latest Ref Rng & Units 05/23/2019 03/11/2019 02/23/2019  WBC 4.0 - 10.5 K/uL 3.2(L) 4.7 4.4  Hemoglobin 13.0 - 17.0 g/dL 12.2(L) 13.6 14.8  Hematocrit 39.0 - 52.0 % 41.6 44.8 48.5  Platelets 150 - 400 K/uL 215 232 222     CMP Latest Ref Rng & Units 02/23/2019 08/25/2018 08/24/2018  Glucose 70 - 99 mg/dL 104(H) 138(H) 178(H)  BUN 6 - 20 mg/dL 14 13 19   Creatinine 0.61 - 1.24 mg/dL 1.09 0.84 0.96  Sodium 135 - 145 mmol/L 138 133(L) 135  Potassium 3.5 - 5.1 mmol/L  3.9 3.6 3.9  Chloride 98 - 111 mmol/L 102 99 101  CO2 22 - 32 mmol/L 26 23 25   Calcium 8.9 - 10.3 mg/dL 8.8(L) 8.1(L) 8.3(L)  Total Protein 6.5 - 8.1 g/dL 7.0 - -  Total Bilirubin 0.3 - 1.2 mg/dL 0.5 - -  Alkaline Phos 38 - 126 U/L 227(H) - -  AST 15 - 41 U/L 23 - -  ALT 0 - 44 U/L 22 - -      RADIOGRAPHIC STUDIES: I have personally reviewed the radiological images as listed and agreed with the findings in the report. No results found.   ASSESSMENT & PLAN:  Jesse Bowers is a 54 y.o. adult with    1.  Polycythemia JAK2 (-) -Per patient, he has had polycythemia since his 19s.  His bone marrow biopsy in 2016 was unremarkable, JAK2 mutation was negative  -he is a transgender.  He receives testosterone injections weekly. He currently undergoes phlebotomy as needed.  -Patient feels better after phlebotomy, he would like to use Hct 46 or above for phlebotomy, which is reasonable. We discussed phlebotomy induced iron deficiency, which he already has. -Labs reviewed today, WBC 3.2, Hg 12.2, Hct 41.6%. Reduced after 05/11/19 right wrist surgery. No need or phlebotomy today.  -Will monitor with labs every 4 months with phlebotomy for Hct>46%. F/u in 1 year or sooner  if more concerns.    2. ?  Supraclavicular adenopathy -Patient previously reported self palpable right supraclavicular adenopathy, which is probably resolved now. No recently history of URI or skin infection.  -He is still concerned about edema of the upper chest wall, above mastectomy incision. Prior exam was negative for palpable mass or adenopathy. He plans to reschedule next mammogram.  -His 04/11/19 CT Chest was negative for lymphadenopathy, I reviewed with patient today. No lymphadenopathy on exam today (05/23/19)   3. History of DVT and stroke, Protein C deficiency -Currently on Xarelto and will continue indefinitely.  -This was held for 4 days for his right knee surgery. He did have mild to moderate bleeding from surgery.   -I encouraged him to avoid fall or injury.   4 History of CVA  5. 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 Brigitte Pulse -He know to keephis testosterone level low, due to previous history of DVT and stroke  6. RA -Currently onSecukinumab (COSENTYX). Continue -He notes his Psoriasis is flaring due to treatment. This is present on scalp, nose, groin and genitals.  -He underwent right wrist surgery for CMC cyst on 05/11/19. He does still have joint pain there. He may have another one this year if indicated.   6. History of Telangiectasia macularis eruptiva perstans (TMEP)  -per pt, he was diagnosed in [facility] in 2012, he presented with skin rash, and the biopsy confirmed.  I do not have records available. He has no typic skin presentation of TMEP now  -I do not see any clinical evidence of systemic mastocytosis.  -will monitor   7. Back Pain and muscular spasms  -Currently on Morphine 60 mg every 12 hrs, Oxycodone 10 mg as needed (BID), and baclofen. -f/u with Dr. [name]. Stable.   8. Mild Splenomegaly  -03/30/19 US shows 14cm. Previous normal spleen size on CT in 2017 -Continue to monitor.   9. Gynecomastia -s/p bilateral mastectomies 14 years ago.  10. S/p right knee replacement  -Done in 08/2018 -He has healed well and still undergoing neuro PT, OT.  -He can ambulate mildly with walker. He is still uses wheelchair just as much.  -He still feels his right leg is better than his left. Stable  11. Polyuria -Managed by urologist.  -He is on myrbetriq, dose adjusted. He has had low urinary output lately. Stable.    Plan -Labs reviewed, no phlebotomy today  -Lab and phlebotomy for HCT>46% every 4 months X3  -F/u in 1 year    No problem-specific Assessment & Plan notes found for this encounter.   No orders of the defined types were placed in this encounter.  All questions were answered. The patient knows to call the  clinic with any problems, questions or concerns. No barriers to learning was detected.     [name], MD 05/23/2019   I, Joslyn Devon, am acting as scribe for [name], MD.   I have reviewed the above documentation for accuracy and completeness, and I agree with the above.

## 2019-05-18 NOTE — Progress Notes (Signed)
Subjective:    Patient ID: Jesse Bowers, adult    DOB: 20-Nov-1965, 54 y.o.   MRN: [id number]  HPI   Jesse Bowers is here in follow-up of his chronic pain syndrome.  He had a fall over the holidays and has developed some increased right wrist pain.  He ultimately was seen by orthopedic surgery and found to have ganglion cyst at the wrist as well as some arthritis in cyst within his carpal bones, particularly the scaphoid.  He had surgery to remove the cyst last week although the cyst within the scaphoid was not treated.  Prior to this he had been going to therapy and working on mobility and self-care.  He has been doing aquatic therapy which has been useful for improving his lower extremity strength and range of motion.  They have been addressing some adaptive walking devices as well.  Remains on oxycodone for breakthrough pain as well as Kadian twice a day for his baseline pain control.  Pain Inventory Average Pain 7 Pain Right Now 8 My pain is constant, sharp, burning, stabbing, tingling and aching  In the last 24 hours, has pain interfered with the following? General activity 9 Relation with others 10 Enjoyment of life 7 What TIME of day is your pain at its worst? daytime and evening Sleep (in general) Poor  Pain is worse with: walking, standing and some activites Pain improves with: heat/ice and therapy/exercise Relief from Meds: 6  Mobility walk with assistance use a walker ability to climb steps?  yes do you drive?  yes use a wheelchair needs help with transfers  Function disabled: date disabled 2014 I need assistance with the following:  feeding, dressing, bathing, meal prep, household duties and shopping  Neuro/Psych weakness numbness tremor tingling trouble walking spasms anxiety loss of taste or smell  Prior Studies Any changes since last visit?  yes CT/MRI  Physicians involved in your care Any changes since last visit?  yes hand, thumb and wrist  surgery   Family History  Problem Relation Age of Onset  . Stroke Maternal Grandfather        66  . Heart attack Maternal Grandfather   . Glaucoma Maternal Grandfather   . Macular degeneration Maternal Grandfather   . Hypertension Mother   . Psoriasis Mother   . Other Mother        meningioma developed ~2019  . Glaucoma Mother   . Cancer Paternal Grandfather   . Heart attack Paternal Grandfather   . Stroke Paternal Uncle        age 61  . Polycythemia Paternal Uncle   . Stroke Maternal Grandmother   . Congestive Heart Failure Maternal Grandmother   . Heart attack Maternal Grandmother   . Protein C deficiency Sister 28       Miscarriages   Social History   Socioeconomic History  . Marital status: Married    Spouse name: Not on file  . Number of children: 2  . Years of education: 4y college  . Highest education level: Not on file  Occupational History  . Occupation: Pediatric Nurse practitioner    Comment: Not working since [place] 2015  Tobacco Use  . Smoking status: Never Smoker  . Smokeless tobacco: Never Used  Substance and Sexual Activity  . Alcohol use: Yes    Comment: social  . Drug use: No  . Sexual activity: Yes    Birth control/protection: None    Comment: patient is a transgender on testosterone shots, no biological  kids  Other Topics Concern  . Not on file  Social History Narrative   Education 4 year college, former Therapist, sports X 15 years, pediatric nurse practitioner x 6 years, did NP degree from [employer] [place]. Relocated to [place] about 2 months ago from [place], [place]. Patient was in [place] for last 4 years and prior to that in [place]. His wife is working as Scientist, research (physical sciences) for [employer]. Patient is not working and applying for disability. They have 2 kids but no biologic children.    Social Determinants of Health   Financial Resource Strain:   . Difficulty of Paying Living Expenses:   Food Insecurity:   . Worried About Charity fundraiser in the Last  Year:   . Arboriculturist in the Last Year:   Transportation Needs:   . Film/video editor (Medical):   [name] Lack of Transportation (Non-Medical):   Physical Activity:   . Days of Exercise per Week:   . Minutes of Exercise per Session:   Stress:   . Feeling of Stress :   Social Connections:   . Frequency of Communication with Friends and Family:   . Frequency of Social Gatherings with Friends and Family:   . Attends Religious Services:   . Active Member of Clubs or Organizations:   . Attends Archivist Meetings:   [name] Marital Status:    Past Surgical History:  Procedure Laterality Date  . ABDOMINAL HYSTERECTOMY Bilateral 1994   TAH, BSO- tranverse incision at 54 yo  . ANKLE ARTHROSCOPY WITH RECONSTRUCTION Right 2007  . CHOLECYSTECTOMY     laparoscopic  . COLONOSCOPY     x3  . EYE SURGERY     Left eye 03/02/2018, right 02/15/2018  . HIP ARTHROSCOPY W/ LABRAL REPAIR Right 05/11/2013   acetabular labral tear 03/30/2013  . KNEE ARTHROPLASTY Right   . KNEE JOINT MANIPULATION Left    x3 under anesthesia  . KNEE SURGERY Bilateral 1984   Right ACL, left PCL repair  . LITHOTRIPSY  2005  . LIVER BIOPSY  2013   normal results.  [name] MASTECTOMY Bilateral    prior to 2009  . MOUTH SURGERY    . NASAL SEPTUM SURGERY N/A 09/20/2015   by ENT Dr. [name]  . OVARIAN CYST SURGERY Left    size of grapefruit, was informed that she had shortened vagina  . SHOULDER SURGERY Bilateral    Right 08/15/2016, Left 11/15/2016  . THUMB ARTHROSCOPY Left   . THYROIDECTOMY, PARTIAL Left 2008  . TOTAL KNEE ARTHROPLASTY Right 08/23/2018   Procedure: TOTAL KNEE ARTHROPLASTY;  Surgeon: [name], MD;  Location: [facility];  Service: Orthopedics;  Laterality: Right;  48mn  . TOTAL KNEE REVISION Left 02/06/2016   Procedure: LEFT TOTAL KNEE REVISION;  Surgeon: FGaynelle Arabian MD;  Location: [facility];  Service: Orthopedics;  Laterality: Left;  . TOTAL KNEE REVISION Left 04/22/2017   Procedure: Left knee  polyethylene revision;  Surgeon: AGaynelle Arabian MD;  Location: [facility];  Service: Orthopedics;  Laterality: Left;  . UPPER GI ENDOSCOPY  2003   Past Medical History:  Diagnosis Date  . Abnormal weight loss   . Anxiety   . Arthritis   . Cataract    OU  . Celiac disease   . Cervical neck pain with evidence of disc disease    patient has a cyst   . Chronic constipation   . Chronic diastolic heart failure ([place]    Pt. denies  .  Chronic pain   . Degenerative disc disease at L5-S1 level    with stenosis  . DVT (deep venous thrombosis) ([facility])    Right upper arm, bilateral leg  . Eczema    inguinal, feet  . Elevated liver enzymes   . 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 Family history of adverse reaction to anesthesia    family has problems with anesthesia of nausea and vomiting   . 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 GERD (gastroesophageal reflux disease)    History of in 20's  . Gluten enteropathy   . H/O parotitis    right   . Hard of hearing   . History of kidney stones   . History of retinal tear    Bilateral  . History of staph infection    required wound vac  . Hx-TIA (transient ischemic attack)    2015  . LVH (left ventricular hypertrophy) 12/15/2016   Mild, noted on ECHO  . MVP (mitral valve prolapse)   . NAFL (nonalcoholic fatty liver)   . Neck pain   . Neuromuscular disorder ([place])    bilateral neuropathy feet.  . Pneumonia 12/17/2010  . Polycythemia   . Polycythemia, secondary   . PONV (postoperative nausea and vomiting)   . Protein C deficiency ([place])    Dr. Anne Fu  . Psoriasis    16 X10 cm psoriatic rash on sole of left foot ; open and occ scant bleeding;   . psoriatic arthritis   . PTSD (post-traumatic stress disorder)   . Scaphoid fracture of wrist 09/23/2013  . Seizure ([place])    childhood, medication until age 41 then weanned completely off  . Sleep apnea    split night study last done by Dr. [name] 06/18/15 shows severe OSA, CSA,  and hypersomnia, rec bipap  . Splenomegaly   . Stenosis of ureteropelvic junction (UPJ)    left  . Stroke [facility])    CVA vs TIA in left cerebrum causing slight right sided weakness-Dr. [name] follows  . 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 Tachycardia    hx of   . Transfusion history    past history- none recent, after surgeries due to blood loss  . Wears glasses   . Wears hearing aid    BP 114/76   Pulse (!) 105   Temp 98.5 F (36.9 C)   Ht 5' 9"  (1.753 m)   Wt 208 lb 11.2 oz (94.7 kg)   SpO2 94%   BMI 30.82 kg/m   Opioid Risk Score:   Fall Risk Score:  `1  Depression screen PHQ 2/9  No flowsheet data found.   Review of Systems  Constitutional: Positive for diaphoresis.  Respiratory: Positive for shortness of breath.   Gastrointestinal: Positive for abdominal pain and constipation.  Musculoskeletal: Positive for gait problem.  Neurological: Positive for tremors, weakness and numbness.  All other systems reviewed and are negative.      Objective:   Physical Exam  General: No acute distress HEENT: EOMI, oral membranes moist Cards: reg rate  Chest: normal effort Abdomen: Soft, NT, ND Skin: dry, intact Extremities: no edema Neuro:Pt is pleasant and alert and oriented x 3. Strength is grossly 3 to 4 out of 5 in all 4's rigth generally stronger than left, right arm limited by splint.   reflexes are hyperactive.  patchy sensory loss in legs..  Musculoskeletal: shoulder pain with ER/IR. Right srist in splint. Knee flexion  greater than 100 bilaterally Psych:pleasant and cooperative   Assessment & Plan:  1. Psoriatic arthritis with pain in multiple areas, most prominently feet, hands, elbows.Hispain is also related to his prior CVA and associated motor/sensory changepatient's complaining of increased s/spasticity. 2. 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  3. Hx of left total knee revisionagain on 04/21/17 per [facility] and right TKA 08/2018 4. Chronic low back pain---MRI withsevere DDD at L5-S1. Left S1 radiculopathy? S/p LEFT L5-S1 ESI 5. Protein C deficiency   6. Left shoulder subluxation, bicipital tendonitis  7. Central sleep apnea  8. Polycythemia  9. Depression with anxiety. 10. Left lateral epidondylitis 11. 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 Most recent cervical MRI (07/08/17) without significant change 12. 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 [place] 14. Right wrist ganglion cysts s/p excision 3/10. further surgery for scaphoid cyst  Plan:  1.Continue effexor 366m xr daily 2.OUtpt therapies, PT, to address LE strength gait. Focus on aquatic based therapy at present.       -avoid wb thru right wrist for now---advance per ortho             -AFO per [facility] 3.Voltaren gel to hands, feet, knees, elbows--- d 4.Continue baclofen for lower extermity spasms/back pain.20 mg 4 times daily #120. 5. Refilled kadian [id number] #[id number] oxycodone [id number] today We will continue the controlled substance monitoring program, this consists of regular clinic visits, examinations, routine drug screening, pill counts as well as use of [place].   Medication was refilled and a second prescription was sent to the patient's pharmacy for next month.   -DRUG SWAB TODAY.   6.Resume therapies after he comes out of splint/cleared by ortho. 7.Has had goodresults with left L5-S1 translaminar injection per Dr. KirJeneen Montgomeryd injection if needed in the future 8. constipaiton:             -off relistor d/t sweating             -continue miralax  Fifteen minutes of face to face patient care time were spent during this visit. All questions  were encouraged and answered.  Follow up with np in [id number]

## 2019-05-19 ENCOUNTER — Ambulatory Visit (INDEPENDENT_AMBULATORY_CARE_PROVIDER_SITE_OTHER): Payer: BC Managed Care – PPO | Admitting: Psychology

## 2019-05-19 DIAGNOSIS — F4312 Post-traumatic stress disorder, chronic: Secondary | ICD-10-CM | POA: Diagnosis not present

## 2019-05-20 ENCOUNTER — Ambulatory Visit: Payer: BC Managed Care – PPO | Admitting: Physical Therapy

## 2019-05-21 LAB — DRUG TOX MONITOR 1 W/CONF, ORAL FLD
Alprazolam: NEGATIVE ng/mL (ref <0.50)
Amphetamines: NEGATIVE ng/mL (ref <10)
Barbiturates: NEGATIVE ng/mL (ref <10)
Benzodiazepines: POSITIVE ng/mL — AB (ref <0.50)
Buprenorphine: NEGATIVE ng/mL (ref <0.10)
Chlordiazepoxide: NEGATIVE ng/mL (ref <0.50)
Clonazepam: NEGATIVE ng/mL (ref <0.50)
Cocaine: NEGATIVE ng/mL (ref <5.0)
Codeine: NEGATIVE ng/mL (ref <2.5)
Diazepam: NEGATIVE ng/mL (ref <0.50)
Dihydrocodeine: NEGATIVE ng/mL (ref <2.5)
Fentanyl: NEGATIVE ng/mL (ref <0.10)
Flunitrazepam: NEGATIVE ng/mL (ref <0.50)
Flurazepam: NEGATIVE ng/mL (ref <0.50)
Heroin Metabolite: NEGATIVE ng/mL (ref <1.0)
Hydrocodone: NEGATIVE ng/mL (ref <2.5)
Hydromorphone: NEGATIVE ng/mL (ref <2.5)
Lorazepam: NEGATIVE ng/mL (ref <0.50)
MARIJUANA: NEGATIVE ng/mL (ref <2.5)
MDMA: NEGATIVE ng/mL (ref <10)
Meprobamate: NEGATIVE ng/mL (ref <2.5)
Methadone: NEGATIVE ng/mL (ref <5.0)
Midazolam: NEGATIVE ng/mL (ref <0.50)
Morphine: 48.8 ng/mL — ABNORMAL HIGH (ref <2.5)
Nicotine Metabolite: NEGATIVE ng/mL (ref <5.0)
Nordiazepam: 6.32 ng/mL — ABNORMAL HIGH (ref <0.50)
Norhydrocodone: NEGATIVE ng/mL (ref <2.5)
Noroxycodone: 21.7 ng/mL — ABNORMAL HIGH (ref <2.5)
Opiates: POSITIVE ng/mL — AB (ref <2.5)
Oxazepam: 0.67 ng/mL — ABNORMAL HIGH (ref <0.50)
Oxycodone: 17 ng/mL — ABNORMAL HIGH (ref <2.5)
Oxymorphone: NEGATIVE ng/mL (ref <2.5)
Phencyclidine: NEGATIVE ng/mL (ref <10)
Tapentadol: NEGATIVE ng/mL (ref <5.0)
Temazepam: NEGATIVE ng/mL (ref <0.50)
Tramadol: NEGATIVE ng/mL (ref <5.0)
Triazolam: NEGATIVE ng/mL (ref <0.50)
Zolpidem: NEGATIVE ng/mL (ref <5.0)

## 2019-05-21 LAB — DRUG TOX ALC METAB W/CON, ORAL FLD: Alcohol Metabolite: NEGATIVE ng/mL (ref <25)

## 2019-05-23 ENCOUNTER — Telehealth: Payer: Self-pay | Admitting: Hematology

## 2019-05-23 ENCOUNTER — Encounter: Payer: Self-pay | Admitting: Hematology

## 2019-05-23 ENCOUNTER — Inpatient Hospital Stay: Payer: BC Managed Care – PPO | Admitting: Hematology

## 2019-05-23 ENCOUNTER — Inpatient Hospital Stay: Payer: BC Managed Care – PPO

## 2019-05-23 ENCOUNTER — Telehealth: Payer: Self-pay

## 2019-05-23 ENCOUNTER — Inpatient Hospital Stay: Payer: BC Managed Care – PPO | Attending: Hematology

## 2019-05-23 ENCOUNTER — Other Ambulatory Visit: Payer: Self-pay

## 2019-05-23 VITALS — BP 124/82 | HR 102 | Temp 98.7°F | Resp 18 | Ht 69.0 in | Wt 207.6 lb

## 2019-05-23 DIAGNOSIS — Z79899 Other long term (current) drug therapy: Secondary | ICD-10-CM | POA: Insufficient documentation

## 2019-05-23 DIAGNOSIS — D45 Polycythemia vera: Secondary | ICD-10-CM | POA: Diagnosis not present

## 2019-05-23 DIAGNOSIS — R358 Other polyuria: Secondary | ICD-10-CM | POA: Diagnosis not present

## 2019-05-23 DIAGNOSIS — Z96651 Presence of right artificial knee joint: Secondary | ICD-10-CM | POA: Diagnosis not present

## 2019-05-23 DIAGNOSIS — Z9013 Acquired absence of bilateral breasts and nipples: Secondary | ICD-10-CM | POA: Diagnosis not present

## 2019-05-23 DIAGNOSIS — D751 Secondary polycythemia: Secondary | ICD-10-CM | POA: Diagnosis present

## 2019-05-23 DIAGNOSIS — R161 Splenomegaly, not elsewhere classified: Secondary | ICD-10-CM | POA: Diagnosis not present

## 2019-05-23 DIAGNOSIS — M549 Dorsalgia, unspecified: Secondary | ICD-10-CM | POA: Insufficient documentation

## 2019-05-23 DIAGNOSIS — Z791 Long term (current) use of non-steroidal anti-inflammatories (NSAID): Secondary | ICD-10-CM | POA: Diagnosis not present

## 2019-05-23 DIAGNOSIS — Z7901 Long term (current) use of anticoagulants: Secondary | ICD-10-CM | POA: Diagnosis not present

## 2019-05-23 DIAGNOSIS — Z86718 Personal history of other venous thrombosis and embolism: Secondary | ICD-10-CM | POA: Diagnosis present

## 2019-05-23 DIAGNOSIS — N62 Hypertrophy of breast: Secondary | ICD-10-CM | POA: Insufficient documentation

## 2019-05-23 DIAGNOSIS — D4701 Cutaneous mastocytosis: Secondary | ICD-10-CM | POA: Diagnosis not present

## 2019-05-23 DIAGNOSIS — Z8673 Personal history of transient ischemic attack (TIA), and cerebral infarction without residual deficits: Secondary | ICD-10-CM | POA: Insufficient documentation

## 2019-05-23 DIAGNOSIS — R590 Localized enlarged lymph nodes: Secondary | ICD-10-CM | POA: Diagnosis present

## 2019-05-23 LAB — CBC WITH DIFFERENTIAL/PLATELET
Abs Immature Granulocytes: 0 10*3/uL (ref 0.00–0.07)
Basophils Absolute: 0 10*3/uL (ref 0.0–0.1)
Basophils Relative: 0 %
Eosinophils Absolute: 0 10*3/uL (ref 0.0–0.5)
Eosinophils Relative: 0 %
HCT: 41.6 % (ref 39.0–52.0)
Hemoglobin: 12.2 g/dL — ABNORMAL LOW (ref 13.0–17.0)
Immature Granulocytes: 0 %
Lymphocytes Relative: 24 %
Lymphs Abs: 0.8 10*3/uL (ref 0.7–4.0)
MCH: 22.3 pg — ABNORMAL LOW (ref 26.0–34.0)
MCHC: 29.3 g/dL — ABNORMAL LOW (ref 30.0–36.0)
MCV: 76.2 fL — ABNORMAL LOW (ref 80.0–100.0)
Monocytes Absolute: 0.3 10*3/uL (ref 0.1–1.0)
Monocytes Relative: 8 %
Neutro Abs: 2.2 10*3/uL (ref 1.7–7.7)
Neutrophils Relative %: 68 %
Platelets: 215 10*3/uL (ref 150–400)
RBC: 5.46 MIL/uL (ref 4.22–5.81)
RDW: 16.4 % — ABNORMAL HIGH (ref 11.5–15.5)
WBC: 3.2 10*3/uL — ABNORMAL LOW (ref 4.0–10.5)
nRBC: 0 % (ref 0.0–0.2)

## 2019-05-23 MED ORDER — FUROSEMIDE 20 MG PO TABS: 20.0000 mg | ORAL_TABLET | Freq: Every day | ORAL | 1 refills | Status: DC

## 2019-05-23 MED ORDER — FUROSEMIDE 40 MG PO TABS: 40.0000 mg | ORAL_TABLET | Freq: Every day | ORAL | 1 refills | Status: DC

## 2019-05-23 NOTE — Telephone Encounter (Signed)
UDS RESULTS CONSISTENT WITH MEDICATION ON FILE

## 2019-05-23 NOTE — Telephone Encounter (Signed)
Scheduled per los. Patient declined printout

## 2019-05-24 ENCOUNTER — Ambulatory Visit: Payer: Medicare Other | Admitting: Psychology

## 2019-05-24 DIAGNOSIS — I5032 Chronic diastolic (congestive) heart failure: Secondary | ICD-10-CM | POA: Insufficient documentation

## 2019-05-25 ENCOUNTER — Ambulatory Visit: Payer: Medicare Other | Admitting: Cardiovascular Disease

## 2019-05-26 ENCOUNTER — Ambulatory Visit: Payer: BC Managed Care – PPO | Admitting: Psychology

## 2019-05-27 ENCOUNTER — Ambulatory Visit: Payer: BC Managed Care – PPO | Admitting: Physical Therapy

## 2019-05-27 ENCOUNTER — Telehealth: Payer: Self-pay

## 2019-05-27 NOTE — Telephone Encounter (Signed)
Jesse Level, do you want 2 days per week of aqua as Karin suggested?

## 2019-05-27 NOTE — Telephone Encounter (Signed)
Patient states he can't start [facility] therapy til May and he wanted therapy order change to Aqua twice a week instead of once a week.

## 2019-05-30 DIAGNOSIS — G609 Hereditary and idiopathic neuropathy, unspecified: Secondary | ICD-10-CM

## 2019-05-30 DIAGNOSIS — G894 Chronic pain syndrome: Secondary | ICD-10-CM

## 2019-05-30 DIAGNOSIS — L4050 Arthropathic psoriasis, unspecified: Secondary | ICD-10-CM

## 2019-05-30 DIAGNOSIS — M5416 Radiculopathy, lumbar region: Secondary | ICD-10-CM

## 2019-05-30 DIAGNOSIS — T84018S Broken internal joint prosthesis, other site, sequela: Secondary | ICD-10-CM

## 2019-05-30 DIAGNOSIS — Z96659 Presence of unspecified artificial knee joint: Secondary | ICD-10-CM

## 2019-05-30 NOTE — Telephone Encounter (Signed)
Jesse Bowers referring Jesse Bowers for aquatic therapy only!  See his message. Thanks!  ZTS

## 2019-05-31 ENCOUNTER — Ambulatory Visit (INDEPENDENT_AMBULATORY_CARE_PROVIDER_SITE_OTHER): Payer: BC Managed Care – PPO | Admitting: Psychology

## 2019-05-31 DIAGNOSIS — F4312 Post-traumatic stress disorder, chronic: Secondary | ICD-10-CM

## 2019-06-01 NOTE — Telephone Encounter (Signed)
Dr. [name], I have Ernestine Mcmurray scheduled to resume aquatic therapy on Monday, 06-06-19.  I understand he wants pool twice a week, but we do not have the availability to see him twice a week at this time.  And to clarify, I work more than just Mondays -- but I am only doing pool on Mondays as I am now having to help take care of my elderly mother, which is why I dropped my Wed. Pool therapy day.  Nita Sells, PTA, is doing Monday and Wed. Pm but her schedule is full also.  I will tell him that we will try for 2x/week as the schedule permits.  I keep hoping that I will be able to add some Wed.'s back on and that would give [place] more pool appt availability.  Thank you so much, Vinnie Level

## 2019-06-02 ENCOUNTER — Ambulatory Visit (INDEPENDENT_AMBULATORY_CARE_PROVIDER_SITE_OTHER): Payer: BC Managed Care – PPO | Admitting: Psychology

## 2019-06-02 DIAGNOSIS — F4323 Adjustment disorder with mixed anxiety and depressed mood: Secondary | ICD-10-CM

## 2019-06-02 DIAGNOSIS — F4312 Post-traumatic stress disorder, chronic: Secondary | ICD-10-CM

## 2019-06-06 ENCOUNTER — Other Ambulatory Visit: Payer: Self-pay

## 2019-06-06 ENCOUNTER — Encounter: Payer: Self-pay | Admitting: Physical Therapy

## 2019-06-06 ENCOUNTER — Ambulatory Visit: Payer: BC Managed Care – PPO | Attending: Physical Medicine & Rehabilitation | Admitting: Physical Therapy

## 2019-06-06 DIAGNOSIS — R2689 Other abnormalities of gait and mobility: Secondary | ICD-10-CM | POA: Diagnosis present

## 2019-06-06 DIAGNOSIS — M6281 Muscle weakness (generalized): Secondary | ICD-10-CM | POA: Diagnosis present

## 2019-06-06 DIAGNOSIS — R2681 Unsteadiness on feet: Secondary | ICD-10-CM | POA: Insufficient documentation

## 2019-06-06 NOTE — Therapy (Signed)
[facility] [address] [place], [place], [address] Phone: [phone]   Fax:  [phone]  Physical Therapy Treatment  Patient Details  Name: Jesse Bowers MRN: [id number] Date of Birth: 07-May-1965 Referring Provider (PT): Clerance Lav, MD   Encounter Date: 06/06/2019  PT End of Session - 06/06/19 1913    Visit Number  16    Number of Visits  24   visit 1 in March   Date for PT Re-Evaluation  06/28/19    Authorization Type  [employer] primary -September 2020-Feb 28th 2021, [facility] secondary (however may lose part B) - 30 visit limit combined PT/OT (Same day counts as 1 visit)    Authorization - Visit Number  18   visit 2 in March   Authorization - Number of Visits  30    PT Start Time  1430    PT Stop Time  1500    PT Time Calculation (min)  30 min    Equipment Utilized During Treatment  Other (comment)   buoyancy cuffs, hand barbells   Activity Tolerance  Patient tolerated treatment well    Behavior During Therapy  [facility] for tasks assessed/performed       Past Medical History:  Diagnosis Date  . Abnormal weight loss   . Anxiety   . Arthritis   . Cataract    OU  . Celiac disease   . Cervical neck pain with evidence of disc disease    patient has a cyst   . Chronic constipation   . Chronic diastolic heart failure ([place])    Pt. denies  . Chronic pain   . Degenerative disc disease at L5-S1 level    with stenosis  . DVT (deep venous thrombosis) (HCC)    Right upper arm, bilateral leg  . Eczema    inguinal, feet  . Elevated liver enzymes   . 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 Family history of adverse reaction to anesthesia    family has problems with anesthesia of nausea and vomiting   . 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 GERD (gastroesophageal reflux disease)    History of in 20's  . Gluten enteropathy   . H/O parotitis    right   . Hard of hearing   . History of kidney stones   . History  of retinal tear    Bilateral  . History of staph infection    required wound vac  . Hx-TIA (transient ischemic attack)    2015  . LVH (left ventricular hypertrophy) 12/15/2016   Mild, noted on ECHO  . MVP (mitral valve prolapse)   . NAFL (nonalcoholic fatty liver)   . Neck pain   . Neuromuscular disorder ([place])    bilateral neuropathy feet.  . Pneumonia 12/17/2010  . Polycythemia   . Polycythemia, secondary   . PONV (postoperative nausea and vomiting)   . Protein C deficiency ([place])    Dr. Anne Fu  . Psoriasis    16 X10 cm psoriatic rash on sole of left foot ; open and occ scant bleeding;   . psoriatic arthritis   . PTSD (post-traumatic stress disorder)   . Scaphoid fracture of wrist 09/23/2013  . Seizure ([place])    childhood, medication until age 64 then weanned completely off  . Sleep apnea    split night study last done by Dr. [name] 06/18/15 shows severe OSA, CSA, and hypersomnia, rec bipap  . Splenomegaly   . Stenosis of ureteropelvic junction (  UPJ)    left  . Stroke [facility])    CVA vs TIA in left cerebrum causing slight right sided weakness-Dr. [name] follows  . Syrinx of spinal cord ([place]) 01/06/2014   c spine on MRI  . Tachycardia    hx of   . Transfusion history    past history- none recent, after surgeries due to blood loss  . Wears glasses   . Wears hearing aid     Past Surgical History:  Procedure Laterality Date  . ABDOMINAL HYSTERECTOMY Bilateral 1994   TAH, BSO- tranverse incision at 54 yo  . ANKLE ARTHROSCOPY WITH RECONSTRUCTION Right 2007  . CHOLECYSTECTOMY     laparoscopic  . COLONOSCOPY     x3  . EYE SURGERY     Left eye 03/02/2018, right 02/15/2018  . HIP ARTHROSCOPY W/ LABRAL REPAIR Right 05/11/2013   acetabular labral tear 03/30/2013  . KNEE ARTHROPLASTY Right   . KNEE JOINT MANIPULATION Left    x3 under anesthesia  . KNEE SURGERY Bilateral 1984   Right ACL, left PCL repair  . LITHOTRIPSY  2005  . LIVER BIOPSY  2013   normal results.  [name]  MASTECTOMY Bilateral    prior to 2009  . MOUTH SURGERY    . NASAL SEPTUM SURGERY N/A 09/20/2015   by ENT Dr. [name]  . OVARIAN CYST SURGERY Left    size of grapefruit, was informed that she had shortened vagina  . SHOULDER SURGERY Bilateral    Right 08/15/2016, Left 11/15/2016  . THUMB ARTHROSCOPY Left   . THYROIDECTOMY, PARTIAL Left 2008  . TOTAL KNEE ARTHROPLASTY Right 08/23/2018   Procedure: TOTAL KNEE ARTHROPLASTY;  Surgeon: [name], MD;  Location: [facility];  Service: Orthopedics;  Laterality: Right;  80mn  . TOTAL KNEE REVISION Left 02/06/2016   Procedure: LEFT TOTAL KNEE REVISION;  Surgeon: FGaynelle Arabian MD;  Location: [facility];  Service: Orthopedics;  Laterality: Left;  . TOTAL KNEE REVISION Left 04/22/2017   Procedure: Left knee polyethylene revision;  Surgeon: AGaynelle Arabian MD;  Location: [facility];  Service: Orthopedics;  Laterality: Left;  . UPPER GI ENDOSCOPY  2003    There were no vitals filed for this visit.  Subjective Assessment - 06/06/19 1911    Subjective  Pt arrived late for session.  Denies any falls or changes other than surgery on R hand.    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 PMH: Psoriatic arthritis, Polycythemia vera, CVA, s/p Rt TKR 08/23/18, Lt knee revision 04/2017, lumbar radiculitis, multiple scaphoid fx's Rt thumb, chronic pain    How long can you walk comfortably?  can walk around the house - furniture walking    Diagnostic tests  MRI cervical spine 07/2017: Unchanged C4 through C6 cervical spinal cord syrinx since the MRIin March 2016, and stable by report since October 2015., MRI brain 2019: No acute intracranial abnormality and stable noncontrast MRI appearance of the brain since 2017.    Patient Stated Goals  can't get up without using his hands, wants to be able to get out of his chair without using his hands - less pain in legs and back, be stronger.    Currently in Pain?  No/denies    Pain Onset   More than a month ago       [facility] temp. 86.7 degrees  Patient seen for aquatic therapy today.  Treatment took place in water 2.5-4 feet deep depending upon  activity.  Pt entered and exited  the pool via ramp negotiation with use Hand rails with supervision   Pt performed runner's stretch for hamstring and heel cord stretching - 30 sec hold 2 reps on RLE & LLE  Pt gait trained in pool - 65mx 4 reps; 3rd and 4th rep with hands under water working on arm swing.  Completed 2 additional reps marching reit  Backward walking 233m 2 reps.  Side step squat x 25 m x 2 reps. Squats x 10 at pool edge with ue support. Sit<>stand from poo bench with contact guard of UE's on bench x 10 reps. Seated on bench for LAQ and marching x 10 reps each with buoyancy cuffs.  Pt performed marching in place 10 reps each leg - cues to perform slowly to improve SLS Buoyant ankle cuff used on each leg - 15 reps for hip strengthening - flexion, extension, abduction.   Pt requires the bouyancy of water for support and for reduced fall risk with balance and unsupported standing activities; buoyancy of water also needed for  spinal decompression for reduced pain with weight bearing activities.  Viscosity of water needed for resistance for strengthening exercises.     PT Short Term Goals - 05/10/19 2342      PT SHORT TERM GOAL #1   Title  Pt will be independent with initial HEP for improved strength, balance, and gait. ALL STGS DUE 04/06/19    Time  4   due to delay in scheduling   Period  Weeks    Status  Achieved    Target Date  04/06/19      PT SHORT TERM GOAL #2   Title  Patient will undergo further assessment of TUG with use of RW vs. BERG  in order to determine fall risk - goal written as appropriate.    Baseline  performed TUG with RW    Time  4    Period  Weeks    Status  Achieved      PT SHORT TERM GOAL #3   Title  Patient will verbalize understanding of fall  prevention strategies in the home.    Time  4    Period  Weeks    Status  Deferred      PT SHORT TERM GOAL #4   Title  Patient will ambulate at least 5042with RW with supervision in order to safely ambulate at home.    Baseline  pt has ambulated 115' in clinic with min guard    Time  4    Period  Weeks    Status  Partially Met      PT SHORT TERM GOAL #5   Title  Patient will perform 4 steps with supervision using single railing and step to pattern in order to increase safety at home with stairs.    Baseline  4 steps with supervision/min guard with BUE support on railings and step to pattern.    Time  4    Period  Weeks    Status  Not Met      PT SHORT TERM GOAL #6   Title  Perform 5x sit to stand from standard arm chair vs. mat table and write goal as appropriate.    Baseline  met on 04/08/19    Time  4    Period  Weeks    Status  Achieved        PT Long Term Goals - 05/10/19 2342  PT LONG TERM GOAL #1   Title  Pt will be independent with final HEP for land/aquatic therapy for improved strength, ROM, balance, and gait. ALL LTGS DUE 06/24/19    Baseline  pt independent with HEP for land therapy - not quite yet for aquatic therapy (has only had a couple sessions)    Time  8   due to delay in scheduling   Period  Weeks    Status  On-going      PT LONG TERM GOAL #2   Title  Pt will decr TUG score with RW to 21 seconds or less with RW to decr fall risk.    Baseline  23.66 seconds with RW on 04/29/19    Time  8    Period  Weeks    Status  Revised      PT LONG TERM GOAL #3   Title  Patient will ambulate at least 150' with RW with mod I in order to safely ambulate at home.    Baseline  115' throughout with RW with supervision/min guard    Time  8    Period  Weeks    Status  On-going      PT LONG TERM GOAL #4   Title  Patient will perform 12 steps with mod I  using BUE suport on railing and step to pattern in order to increase safety at home with stairs.    Baseline  8  steps with supervision with B railings and step to pattern    Time  8    Period  Weeks    Status  On-going      PT LONG TERM GOAL #5   Title  Pt will undergo assessment of gait speed with RW- goal to be written as appropriate.    Time  8    Period  Weeks    Status  New      PT LONG TERM GOAL #6   Title  Pt will decr 5x sit <> stand time from 28 seconds or less from mat table using BUE support to demo improved functional LE strength.   31 seconds on 04/29/19   Time  8    Period  Weeks    Status  Not Met            Plan - 06/06/19 1914    Clinical Impression Statement  Pt returns for aquatic session after R wrist surgery.  No c/o pain today during session.  Pt continues to have decreased balance at times but recovers without assist.  Continue PT per POC.    PT Treatment/Interventions  ADLs/Self Care Home Management;Neuromuscular re-education;Therapeutic activities;Therapeutic exercise;Patient/family education;Manual techniques;Scar mobilization;Passive range of motion;Manual lymph drainage;Electrical Stimulation;Aquatic Therapy;Energy conservation;Gait training;Stair training;Functional mobility training;DME Instruction;Balance training;Orthotic Fit/Training    PT Next Visit Plan  Rt wrist sx 05-11-19:   fall prevention strategies in the home. continue LE strengthening, gait training (walking around obstacles),  standing balance in // bars, fall prevention strategy handout. Nustep for strengthening/endurance. core strengthening       Patient will benefit from skilled therapeutic intervention in order to improve the following deficits and impairments:     Visit Diagnosis: Muscle weakness (generalized)  Other abnormalities of gait and mobility  Unsteadiness on feet     Problem List Patient Active Problem List   Diagnosis Date Noted  . Chronic diastolic heart failure ([place]) 05/24/2019  . Urinary dysfunction 04/12/2019  . Osteoarthritis of right knee 08/23/2018  .  Constipation due  to opioid therapy 03/30/2018  . Retinopathy of both eyes 01/06/2018  . SNHL (sensorineural hearing loss) 12/04/2017  . Abnormal urinary stream 12/03/2017  . Osteoarthritis of carpometacarpal (CMC) joint of thumb 11/30/2017  . Muscle weakness 11/17/2017  . Transient vision disturbance 11/12/2017  . Bilateral hand pain 10/30/2017  . Pain of left hip joint 10/09/2017  . Gynecomastia 07/10/2017  . Iron deficiency anemia 07/05/2017  . Spasticity 05/20/2017  . Lumbar radiculitis 04/20/2017  . Ulnar 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, left 11/28/2016  . Idiopathic peripheral neuropathy 11/28/2016  . 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 Long term (current) use of anticoagulants 08/23/2015  . Right upper quadrant abdominal pain 08/23/2015  . Memory loss 05/10/2015  . Gait abnormality 04/07/2015  . Alkaline phosphatase elevation 04/07/2015  . History of thrombosis 03/26/2015  . Medial epicondylitis 02/07/2015  . Cognitive decline 12/21/2014  . Leukopenia 12/05/2014  . Rotator cuff syndrome of right shoulder 10/27/2014  . Status post left knee replacement 08/22/2014  . Left lateral epicondylitis 08/22/2014  . Chronic cerebral ischemia 08/18/2014  . Arthrofibrosis of knee joint 08/17/2014  . Cubital canal compression syndrome, left 08/17/2014  . 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 Chronic pain syndrome 04/10/2014  . Insomnia 04/10/2014  . Chronic non-specific white matter lesions on MRI 04/10/2014  . CFS (chronic fatigue syndrome) 04/10/2014  . 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 Biceps tendonitis on left 03/01/2014  . Polycythemia vera ([place]) 12/27/2013  . H/O TIA (transient ischemic attack) and stroke 12/27/2013  . Neck pain 12/27/2013  . OSA (obstructive sleep apnea) 12/08/2013  . 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 Depression with anxiety 08/04/2013  . Protein C deficiency ([place]) 08/01/2013  . Post traumatic stress disorder (PTSD) 08/01/2013  .  Speech abnormality 07/25/2013  . Obesity 05/10/2013  . Lower extremity edema 05/10/2013  . GERD (gastroesophageal reflux disease) 05/10/2013  . Arthritis 05/10/2013  . OA (osteoarthritis) of knee 03/15/2013  . Headache 10/25/2012  . Palpitations 10/18/2012  . Fatty liver determined by biopsy 06/01/2012  . Arthropathic psoriasis, unspecified ([place]) 04/29/2012  . Abnormal liver enzymes 03/29/2012  . Psoriatic arthritis ([place]) 12/29/2011  . Left Renal Hydronephrosis 12/11/2010  . 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 Celiac disease 05/27/2010  . Thyroid nodule 05/27/2010  . Male-to-male transgender person 09/20/2002    Narda Bonds, [place] 06/06/19 7:23 PM Phone: [phone] Fax: [facility] [address] [place], [place], [address] Phone: [phone]   Fax:  [phone]  Name: Aydien Majette MRN: [id number] Date of Birth: 15-Aug-1965

## 2019-06-07 ENCOUNTER — Ambulatory Visit (INDEPENDENT_AMBULATORY_CARE_PROVIDER_SITE_OTHER): Payer: BC Managed Care – PPO | Admitting: Psychology

## 2019-06-07 DIAGNOSIS — F4323 Adjustment disorder with mixed anxiety and depressed mood: Secondary | ICD-10-CM | POA: Diagnosis not present

## 2019-06-09 ENCOUNTER — Ambulatory Visit (INDEPENDENT_AMBULATORY_CARE_PROVIDER_SITE_OTHER): Payer: BC Managed Care – PPO | Admitting: Psychology

## 2019-06-09 DIAGNOSIS — F4312 Post-traumatic stress disorder, chronic: Secondary | ICD-10-CM | POA: Diagnosis not present

## 2019-06-10 ENCOUNTER — Ambulatory Visit: Payer: BC Managed Care – PPO | Admitting: Physical Therapy

## 2019-06-13 ENCOUNTER — Ambulatory Visit: Payer: BC Managed Care – PPO | Admitting: Physical Therapy

## 2019-06-14 ENCOUNTER — Ambulatory Visit (INDEPENDENT_AMBULATORY_CARE_PROVIDER_SITE_OTHER): Payer: BC Managed Care – PPO | Admitting: Psychology

## 2019-06-14 DIAGNOSIS — F4323 Adjustment disorder with mixed anxiety and depressed mood: Secondary | ICD-10-CM

## 2019-06-16 ENCOUNTER — Ambulatory Visit (INDEPENDENT_AMBULATORY_CARE_PROVIDER_SITE_OTHER): Payer: BC Managed Care – PPO | Admitting: Psychology

## 2019-06-16 DIAGNOSIS — F4312 Post-traumatic stress disorder, chronic: Secondary | ICD-10-CM | POA: Diagnosis not present

## 2019-06-17 ENCOUNTER — Ambulatory Visit: Payer: BC Managed Care – PPO | Admitting: Physical Therapy

## 2019-06-17 ENCOUNTER — Other Ambulatory Visit: Payer: BC Managed Care – PPO

## 2019-06-20 ENCOUNTER — Ambulatory Visit: Payer: BC Managed Care – PPO | Admitting: Physical Therapy

## 2019-06-20 ENCOUNTER — Other Ambulatory Visit: Payer: Self-pay

## 2019-06-20 DIAGNOSIS — R2689 Other abnormalities of gait and mobility: Secondary | ICD-10-CM

## 2019-06-20 DIAGNOSIS — M6281 Muscle weakness (generalized): Secondary | ICD-10-CM | POA: Diagnosis not present

## 2019-06-20 DIAGNOSIS — R2681 Unsteadiness on feet: Secondary | ICD-10-CM

## 2019-06-21 ENCOUNTER — Ambulatory Visit (INDEPENDENT_AMBULATORY_CARE_PROVIDER_SITE_OTHER): Payer: BC Managed Care – PPO | Admitting: Psychology

## 2019-06-21 DIAGNOSIS — F4323 Adjustment disorder with mixed anxiety and depressed mood: Secondary | ICD-10-CM | POA: Diagnosis not present

## 2019-06-22 ENCOUNTER — Encounter: Payer: Self-pay | Admitting: Physical Therapy

## 2019-06-22 NOTE — Therapy (Signed)
[facility] [address] [place], [place], [address] Phone: [phone]   Fax:  [phone]  Physical Therapy Treatment  Patient Details  Name: Jesse Bowers MRN: [id number] Date of Birth: Dec 19, 1965 Referring Provider (PT): Clerance Lav, MD   Encounter Date: 06/20/2019  PT End of Session - 06/22/19 1106    Visit Number  17   visit 4 since 05-02-19   Number of Visits  24   visit 1 in March   Date for PT Re-Evaluation  06/28/19    Authorization Type  [employer] primary -September 2020-Feb 28th 2021, [facility] secondary (however may lose part B) - 30 visit limit combined PT/OT (Same day counts as 1 visit)    Authorization - Visit Number  4   visit 2 in March   Authorization - Number of Visits  30    PT Start Time  1505    PT Stop Time  1550    PT Time Calculation (min)  45 min    Equipment Utilized During Treatment  Other (comment)   buoyancy cuffs, hand barbells   Activity Tolerance  Patient tolerated treatment well    Behavior During Therapy  [facility] for tasks assessed/performed       Past Medical History:  Diagnosis Date  . Abnormal weight loss   . Anxiety   . Arthritis   . Cataract    OU  . Celiac disease   . Cervical neck pain with evidence of disc disease    patient has a cyst   . Chronic constipation   . Chronic diastolic heart failure ([place])    Pt. denies  . Chronic pain   . Degenerative disc disease at L5-S1 level    with stenosis  . DVT (deep venous thrombosis) (HCC)    Right upper arm, bilateral leg  . Eczema    inguinal, feet  . Elevated liver enzymes   . 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 Family history of adverse reaction to anesthesia    family has problems with anesthesia of nausea and vomiting   . 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 GERD (gastroesophageal reflux disease)    History of in 20's  . Gluten enteropathy   . H/O parotitis    right   . Hard of hearing   . History of  kidney stones   . History of retinal tear    Bilateral  . History of staph infection    required wound vac  . Hx-TIA (transient ischemic attack)    2015  . LVH (left ventricular hypertrophy) 12/15/2016   Mild, noted on ECHO  . MVP (mitral valve prolapse)   . NAFL (nonalcoholic fatty liver)   . Neck pain   . Neuromuscular disorder ([place])    bilateral neuropathy feet.  . Pneumonia 12/17/2010  . Polycythemia   . Polycythemia, secondary   . PONV (postoperative nausea and vomiting)   . Protein C deficiency ([place])    Dr. Anne Fu  . Psoriasis    16 X10 cm psoriatic rash on sole of left foot ; open and occ scant bleeding;   . psoriatic arthritis   . PTSD (post-traumatic stress disorder)   . Scaphoid fracture of wrist 09/23/2013  . Seizure ([place])    childhood, medication until age 89 then weanned completely off  . Sleep apnea    split night study last done by Dr. [name] 06/18/15 shows severe OSA, CSA, and hypersomnia, rec bipap  . Splenomegaly   .  Stenosis of ureteropelvic junction (UPJ)    left  . Stroke [facility])    CVA vs TIA in left cerebrum causing slight right sided weakness-Dr. [name] follows  . 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 Tachycardia    hx of   . Transfusion history    past history- none recent, after surgeries due to blood loss  . Wears glasses   . Wears hearing aid     Past Surgical History:  Procedure Laterality Date  . ABDOMINAL HYSTERECTOMY Bilateral 1994   TAH, BSO- tranverse incision at 54 yo  . ANKLE ARTHROSCOPY WITH RECONSTRUCTION Right 2007  . CHOLECYSTECTOMY     laparoscopic  . COLONOSCOPY     x3  . EYE SURGERY     Left eye 03/02/2018, right 02/15/2018  . HIP ARTHROSCOPY W/ LABRAL REPAIR Right 05/11/2013   acetabular labral tear 03/30/2013  . KNEE ARTHROPLASTY Right   . KNEE JOINT MANIPULATION Left    x3 under anesthesia  . KNEE SURGERY Bilateral 1984   Right ACL, left PCL repair  . LITHOTRIPSY  2005  . LIVER BIOPSY   2013   normal results.  [name] MASTECTOMY Bilateral    prior to 2009  . MOUTH SURGERY    . NASAL SEPTUM SURGERY N/A 09/20/2015   by ENT Dr. [name]  . OVARIAN CYST SURGERY Left    size of grapefruit, was informed that she had shortened vagina  . SHOULDER SURGERY Bilateral    Right 08/15/2016, Left 11/15/2016  . THUMB ARTHROSCOPY Left   . THYROIDECTOMY, PARTIAL Left 2008  . TOTAL KNEE ARTHROPLASTY Right 08/23/2018   Procedure: TOTAL KNEE ARTHROPLASTY;  Surgeon: [name], MD;  Location: [facility];  Service: Orthopedics;  Laterality: Right;  21mn  . TOTAL KNEE REVISION Left 02/06/2016   Procedure: LEFT TOTAL KNEE REVISION;  Surgeon: FGaynelle Arabian MD;  Location: [facility];  Service: Orthopedics;  Laterality: Left;  . TOTAL KNEE REVISION Left 04/22/2017   Procedure: Left knee polyethylene revision;  Surgeon: AGaynelle Arabian MD;  Location: [facility];  Service: Orthopedics;  Laterality: Left;  . UPPER GI ENDOSCOPY  2003    There were no vitals filed for this visit.  Subjective Assessment - 06/22/19 1104    Subjective  Pt states he thinks he only has 4 sessions remaining with insurance - pt was informed that his insurance was verified and that his plan renewed on May 02, 2019 with 30 visits 05-02-19 - 04-30-20.  Pt to decide if he wishes to continue with aquatic therapy or hold to save visits for remainder of year.    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 PMH: Psoriatic arthritis, Polycythemia vera, CVA, s/p Rt TKR 08/23/18, Lt knee revision 04/2017, lumbar radiculitis, multiple scaphoid fx's Rt thumb, chronic pain    How long can you walk comfortably?  can walk around the house - furniture walking    Diagnostic tests  MRI cervical spine 07/2017: Unchanged C4 through C6 cervical spinal cord syrinx since the MRIin March 2016, and stable by report since October 2015., MRI brain 2019: No acute intracranial abnormality and stable noncontrast MRI appearance of the  brain since 2017.    Patient Stated Goals  can't get up without using his hands, wants to be able to get out of his chair without using his hands - less pain in legs and back, be stronger.    Currently in Pain?  No/denies  Pain Onset  More than a month ago         Aquatic therapy at [facility] - pool temp. 87.4 degrees  Patient seen for aquatic therapy today.  Treatment took place in water 2.5-4 feet deep depending upon activity.  Pt entered and exited  the pool via ramp negotiation with use of Hand rails with supervision   Pt performed runner's stretch for hamstring and heel cord stretching - 30 sec hold 2 reps on RLE & LLE  Pt gait trained in pool - 37mx 4 reps; bar bells used on 3rd and 4th reps for increased resistance for strengthening and also to fascilitate reciprocal arm swing in gait   Pt performed marching in place 10 reps each leg - cues to perform slowly to improve SLS Buoyant ankle cuff used on each leg - 15 reps for hip strengthening - flexion, extension, abduction, hip and knee flexion/extension with knee flexed Squats x 10 reps with min UE support on pool edge   Braiding approx. 30' x 2 reps without UE support  Pt performed Ai Chi postures of soothing 10 reps and balancing 5 reps, then alternating flexion/extension with each leg; pt had some LOB with this exercise but able to  Recover independently  Pt requires the bouyancy of water for support and for reduced fall risk with balance and unsupported standing activities; buoyancy of water also needed for  spinal decompression for reduced pain with weight bearing activities.  Viscosity of water needed for resistance for strengthening exercises.                                   PT Short Term Goals - 06/22/19 1110      PT SHORT TERM GOAL #1   Title  Pt will be independent with initial HEP for improved strength, balance, and gait. ALL STGS DUE 04/06/19    Time  4   due to delay in scheduling    Period  Weeks    Status  Achieved    Target Date  04/06/19      PT SHORT TERM GOAL #2   Title  Patient will undergo further assessment of TUG with use of RW vs. BERG  in order to determine fall risk - goal written as appropriate.    Baseline  performed TUG with RW    Time  4    Period  Weeks    Status  Achieved      PT SHORT TERM GOAL #3   Title  Patient will verbalize understanding of fall prevention strategies in the home.    Time  4    Period  Weeks    Status  Deferred      PT SHORT TERM GOAL #4   Title  Patient will ambulate at least 537 with RW with supervision in order to safely ambulate at home.    Baseline  pt has ambulated 115' in clinic with min guard    Time  4    Period  Weeks    Status  Partially Met      PT SHORT TERM GOAL #5   Title  Patient will perform 4 steps with supervision using single railing and step to pattern in order to increase safety at home with stairs.    Baseline  4 steps with supervision/min guard with BUE support on railings and step to pattern.    Time  4  Period  Weeks    Status  Not Met      PT SHORT TERM GOAL #6   Title  Perform 5x sit to stand from standard arm chair vs. mat table and write goal as appropriate.    Baseline  met on 04/08/19    Time  4    Period  Weeks    Status  Achieved        PT Long Term Goals - 06/22/19 1110      PT LONG TERM GOAL #1   Title  Pt will be independent with final HEP for land/aquatic therapy for improved strength, ROM, balance, and gait. ALL LTGS DUE 06/24/19    Baseline  pt independent with HEP for land therapy - not quite yet for aquatic therapy (has only had a couple sessions)    Time  8   due to delay in scheduling   Period  Weeks    Status  On-going      PT LONG TERM GOAL #2   Title  Pt will decr TUG score with RW to 21 seconds or less with RW to decr fall risk.    Baseline  23.66 seconds with RW on 04/29/19    Time  8    Period  Weeks    Status  Revised      PT LONG TERM GOAL #3    Title  Patient will ambulate at least 150' with RW with mod I in order to safely ambulate at home.    Baseline  115' throughout with RW with supervision/min guard    Time  8    Period  Weeks    Status  On-going      PT LONG TERM GOAL #4   Title  Patient will perform 12 steps with mod I  using BUE suport on railing and step to pattern in order to increase safety at home with stairs.    Baseline  8 steps with supervision with B railings and step to pattern    Time  8    Period  Weeks    Status  On-going      PT LONG TERM GOAL #5   Title  Pt will undergo assessment of gait speed with RW- goal to be written as appropriate.    Time  8    Period  Weeks    Status  New      PT LONG TERM GOAL #6   Title  Pt will decr 5x sit <> stand time from 28 seconds or less from mat table using BUE support to demo improved functional LE strength.   31 seconds on 04/29/19   Time  8    Period  Weeks    Status  Not Met            Plan - 06/22/19 1108    Clinical Impression Statement  Pt continues to tolerate aquatic therapy well with no c/o pain and minimal LOB with Ai Chi postures, demonstrating improved core stabilization and trunk control.  Pt was given aquatic HEP to continue independently upon D/C from PT - pt states he has inquired about [employer].  Pt is progressing well towards goals.    PT Frequency  1x / week    PT Duration  6 weeks    PT Treatment/Interventions  ADLs/Self Care Home Management;Neuromuscular re-education;Therapeutic activities;Therapeutic exercise;Patient/family education;Manual techniques;Scar mobilization;Passive range of motion;Manual lymph drainage;Electrical Stimulation;Aquatic Therapy;Energy conservation;Gait training;Stair training;Functional mobility training;DME Instruction;Balance training;Orthotic Fit/Training  PT Next Visit Plan  Rt wrist sx 05-11-19:   fall prevention strategies in the home. continue LE strengthening, gait training (walking around obstacles),   standing balance in // bars, fall prevention strategy handout. Nustep for strengthening/endurance. core strengthening       Patient will benefit from skilled therapeutic intervention in order to improve the following deficits and impairments:     Visit Diagnosis: Other abnormalities of gait and mobility  Unsteadiness on feet  Muscle weakness (generalized)     Problem List Patient Active Problem List   Diagnosis Date Noted  . Chronic diastolic heart failure ([place]) 05/24/2019  . Urinary dysfunction 04/12/2019  . Osteoarthritis of right knee 08/23/2018  . Constipation due to opioid therapy 03/30/2018  . Retinopathy of both eyes 01/06/2018  . SNHL (sensorineural hearing loss) 12/04/2017  . Abnormal urinary stream 12/03/2017  . Osteoarthritis of carpometacarpal (CMC) joint of thumb 11/30/2017  . Muscle weakness 11/17/2017  . Transient vision disturbance 11/12/2017  . Bilateral hand pain 10/30/2017  . Pain of left hip joint 10/09/2017  . Gynecomastia 07/10/2017  . Iron deficiency anemia 07/05/2017  . Spasticity 05/20/2017  . Lumbar radiculitis 04/20/2017  . Ulnar 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, left 11/28/2016  . Idiopathic peripheral neuropathy 11/28/2016  . 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 Long term (current) use of anticoagulants 08/23/2015  . Right upper quadrant abdominal pain 08/23/2015  . Memory loss 05/10/2015  . Gait abnormality 04/07/2015  . Alkaline phosphatase elevation 04/07/2015  . History of thrombosis 03/26/2015  . Medial epicondylitis 02/07/2015  . Cognitive decline 12/21/2014  . Leukopenia 12/05/2014  . Rotator cuff syndrome of right shoulder 10/27/2014  . Status post left knee replacement 08/22/2014  . Left lateral epicondylitis 08/22/2014  . Chronic cerebral ischemia 08/18/2014  . Arthrofibrosis of knee joint 08/17/2014  . Cubital canal compression syndrome, left 08/17/2014  . 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 Chronic pain syndrome  04/10/2014  . Insomnia 04/10/2014  . Chronic non-specific white matter lesions on MRI 04/10/2014  . CFS (chronic fatigue syndrome) 04/10/2014  . 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 Biceps tendonitis on left 03/01/2014  . Polycythemia vera ([place]) 12/27/2013  . H/O TIA (transient ischemic attack) and stroke 12/27/2013  . Neck pain 12/27/2013  . OSA (obstructive sleep apnea) 12/08/2013  . 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 Depression with anxiety 08/04/2013  . Protein C deficiency ([place]) 08/01/2013  . Post traumatic stress disorder (PTSD) 08/01/2013  . Speech abnormality 07/25/2013  . Obesity 05/10/2013  . Lower extremity edema 05/10/2013  . GERD (gastroesophageal reflux disease) 05/10/2013  . Arthritis 05/10/2013  . OA (osteoarthritis) of knee 03/15/2013  . Headache 10/25/2012  . Palpitations 10/18/2012  . Fatty liver determined by biopsy 06/01/2012  . Arthropathic psoriasis, unspecified ([place]) 04/29/2012  . Abnormal liver enzymes 03/29/2012  . Psoriatic arthritis ([place]) 12/29/2011  . Left Renal Hydronephrosis 12/11/2010  . 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 Celiac disease 05/27/2010  . Thyroid nodule 05/27/2010  . Male-to-male transgender person 09/20/2002    Alda [name], [place], [place] 06/22/2019, 11:11 AM  [facility] [address] [place], [place], [address] Phone: [phone]   Fax:  [phone]  Name: Jesse Bowers MRN: [id number] Date of Birth: [birth year]

## 2019-06-23 ENCOUNTER — Ambulatory Visit (INDEPENDENT_AMBULATORY_CARE_PROVIDER_SITE_OTHER): Payer: BC Managed Care – PPO | Admitting: Psychology

## 2019-06-23 DIAGNOSIS — F4323 Adjustment disorder with mixed anxiety and depressed mood: Secondary | ICD-10-CM

## 2019-06-24 ENCOUNTER — Ambulatory Visit: Payer: BC Managed Care – PPO | Admitting: Physical Therapy

## 2019-06-27 ENCOUNTER — Ambulatory Visit: Payer: BC Managed Care – PPO | Admitting: Physical Therapy

## 2019-06-28 ENCOUNTER — Ambulatory Visit: Payer: Medicare Other | Admitting: Psychology

## 2019-06-28 ENCOUNTER — Ambulatory Visit
Admission: RE | Admit: 2019-06-28 | Discharge: 2019-06-28 | Disposition: A | Discharge status: Home / Self Care | Payer: BC Managed Care – PPO | Source: Ambulatory Visit | Attending: Family Medicine | Admitting: Family Medicine

## 2019-06-28 ENCOUNTER — Other Ambulatory Visit: Payer: Self-pay

## 2019-06-28 DIAGNOSIS — N644 Mastodynia: Secondary | ICD-10-CM

## 2019-06-30 ENCOUNTER — Ambulatory Visit (INDEPENDENT_AMBULATORY_CARE_PROVIDER_SITE_OTHER): Payer: BC Managed Care – PPO | Admitting: Psychology

## 2019-06-30 ENCOUNTER — Telehealth: Payer: Self-pay | Admitting: Cardiovascular Disease

## 2019-06-30 DIAGNOSIS — F4323 Adjustment disorder with mixed anxiety and depressed mood: Secondary | ICD-10-CM

## 2019-06-30 NOTE — Telephone Encounter (Signed)
He did the right thing and the way he adjusted his diuretics.  Agree with the advice for him to be more attentive to his diet, may be there is a unsuspected source of excess sodium that he needs to eliminate.

## 2019-06-30 NOTE — Telephone Encounter (Signed)
Pt c/o swelling: STAT is pt has developed SOB within 24 hours  1) How much weight have you gained and in what time span? 10 1lbs in 4 days  2) If swelling, where is the swelling located? hands  3) Are you currently taking a fluid pill? yes  4) Are you currently SOB? yes  5) Do you have a log of your daily weights (if so, list)? Yes   6) Have you gained 3 pounds in a day or 5 pounds in a week? Yes 10 lbs in 4 days 7)  8)  9) Have you traveled recently? no

## 2019-06-30 NOTE — Telephone Encounter (Signed)
Pt called to report that starting last week/ 4-5 days ago he has been gaining about 2 lbs per day and then his hands felt "puffy".. no lower extremity edema. He can lay flat to sleep but has been uncomfortable and restless.   He says he gets out of breath walking from room to room in his house but breathing is okay at rest.   Pt says is on Lasix 60 mg a day but:  This past Tuesday and Wednesday he took 80 mg with an added Kcl 10 meq and added K in his diet.    Weight: 207 to 219.8 has come down with the added lasix to 208 yesterday but today back up to 210 lbs and he was back on his 60 mg today.   Pt to continue to monitor. To watch NA in his diet.Marland KitchenMarland Kitchen I will forward to Dr. [name] for advice and recommendations.         [name]

## 2019-06-30 NOTE — Telephone Encounter (Signed)
Pt called and will continue to monitor his NA intake and his weight. Will call if he has any further problems.

## 2019-07-01 ENCOUNTER — Telehealth: Payer: Self-pay | Admitting: Physical Medicine & Rehabilitation

## 2019-07-01 ENCOUNTER — Ambulatory Visit: Payer: BC Managed Care – PPO | Admitting: Physical Therapy

## 2019-07-01 DIAGNOSIS — L4050 Arthropathic psoriasis, unspecified: Secondary | ICD-10-CM

## 2019-07-01 DIAGNOSIS — M5416 Radiculopathy, lumbar region: Secondary | ICD-10-CM

## 2019-07-01 MED ORDER — OXYCODONE HCL 10 MG PO TABS: 10.0000 mg | ORAL_TABLET | Freq: Three times a day (TID) | ORAL | 0 refills | Status: DC | PRN

## 2019-07-01 MED ORDER — MORPHINE SULFATE ER 60 MG PO CP24: 60.0000 mg | ORAL_CAPSULE | Freq: Two times a day (BID) | ORAL | 0 refills | Status: DC

## 2019-07-01 NOTE — Telephone Encounter (Signed)
Patient will be out of medications before he comes into office in May.  He runs out of Morphine on Sunday 5/2 and oxycodone on Friday 4/30.  Please call patient.

## 2019-07-01 NOTE — Telephone Encounter (Signed)
PMP was Reviewed:  Morphine was filled on 06/03/2019 and Oxycodone was filled on 05/30/2019.  The above prescriptions was e-scribed.This provider placed a call to Mr. Mendell, no answer. Left message regarding the above.

## 2019-07-04 ENCOUNTER — Ambulatory Visit: Payer: Self-pay | Admitting: Physical Therapy

## 2019-07-04 ENCOUNTER — Other Ambulatory Visit: Payer: Self-pay

## 2019-07-04 ENCOUNTER — Ambulatory Visit: Payer: BC Managed Care – PPO | Attending: Physical Medicine & Rehabilitation | Admitting: Physical Therapy

## 2019-07-04 DIAGNOSIS — M6281 Muscle weakness (generalized): Secondary | ICD-10-CM | POA: Insufficient documentation

## 2019-07-04 DIAGNOSIS — R2689 Other abnormalities of gait and mobility: Secondary | ICD-10-CM | POA: Diagnosis present

## 2019-07-04 DIAGNOSIS — R2681 Unsteadiness on feet: Secondary | ICD-10-CM | POA: Insufficient documentation

## 2019-07-05 ENCOUNTER — Encounter: Payer: Self-pay | Admitting: Physical Therapy

## 2019-07-05 ENCOUNTER — Ambulatory Visit (INDEPENDENT_AMBULATORY_CARE_PROVIDER_SITE_OTHER): Payer: BC Managed Care – PPO | Admitting: Psychology

## 2019-07-05 DIAGNOSIS — F4312 Post-traumatic stress disorder, chronic: Secondary | ICD-10-CM

## 2019-07-05 NOTE — Therapy (Signed)
[facility] [address] [place], [place], [address] Phone: [phone]   Fax:  [phone]  Physical Therapy Treatment  Patient Details  Name: Jesse Bowers MRN: [id number] Date of Birth: September 16, 1965 Referring Provider (PT): Clerance Lav, MD   Encounter Date: 07/04/2019  PT End of Session - 07/05/19 2032    Visit Number  18   visit 5 since 05-02-19   Number of Visits  24   visit 1 in March   Date for PT Re-Evaluation  06/28/19    Authorization Type  [employer] primary -September 2020-Feb 28th 2021, [facility] secondary (however may lose part B) - 30 visit limit combined PT/OT (Same day counts as 1 visit)    Authorization - Visit Number  5   visit 2 in March   Authorization - Number of Visits  30    PT Start Time  1555    PT Stop Time  1640    PT Time Calculation (min)  45 min    Equipment Utilized During Treatment  Other (comment)   buoyancy cuffs, hand barbells   Activity Tolerance  Patient tolerated treatment well    Behavior During Therapy  [facility] for tasks assessed/performed       Past Medical History:  Diagnosis Date  . Abnormal weight loss   . Anxiety   . Arthritis   . Cataract    OU  . Celiac disease   . Cervical neck pain with evidence of disc disease    patient has a cyst   . Chronic constipation   . Chronic diastolic heart failure ([place])    Pt. denies  . Chronic pain   . Degenerative disc disease at L5-S1 level    with stenosis  . DVT (deep venous thrombosis) (HCC)    Right upper arm, bilateral leg  . Eczema    inguinal, feet  . Elevated liver enzymes   . 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 Family history of adverse reaction to anesthesia    family has problems with anesthesia of nausea and vomiting   . 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 GERD (gastroesophageal reflux disease)    History of in 20's  . Gluten enteropathy   . H/O parotitis    right   . Hard of hearing   . History of  kidney stones   . History of retinal tear    Bilateral  . History of staph infection    required wound vac  . Hx-TIA (transient ischemic attack)    2015  . LVH (left ventricular hypertrophy) 12/15/2016   Mild, noted on ECHO  . MVP (mitral valve prolapse)   . NAFL (nonalcoholic fatty liver)   . Neck pain   . Neuromuscular disorder ([place])    bilateral neuropathy feet.  . Pneumonia 12/17/2010  . Polycythemia   . Polycythemia, secondary   . PONV (postoperative nausea and vomiting)   . Protein C deficiency ([place])    Dr. Anne Fu  . Psoriasis    16 X10 cm psoriatic rash on sole of left foot ; open and occ scant bleeding;   . psoriatic arthritis   . PTSD (post-traumatic stress disorder)   . Scaphoid fracture of wrist 09/23/2013  . Seizure ([place])    childhood, medication until age 49 then weanned completely off  . Sleep apnea    split night study last done by Dr. [name] 06/18/15 shows severe OSA, CSA, and hypersomnia, rec bipap  . Splenomegaly   .  Stenosis of ureteropelvic junction (UPJ)    left  . Stroke [facility])    CVA vs TIA in left cerebrum causing slight right sided weakness-Dr. [name] follows  . 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 Tachycardia    hx of   . Transfusion history    past history- none recent, after surgeries due to blood loss  . Wears glasses   . Wears hearing aid     Past Surgical History:  Procedure Laterality Date  . ABDOMINAL HYSTERECTOMY Bilateral 1994   TAH, BSO- tranverse incision at 54 yo  . ANKLE ARTHROSCOPY WITH RECONSTRUCTION Right 2007  . CHOLECYSTECTOMY     laparoscopic  . COLONOSCOPY     x3  . EYE SURGERY     Left eye 03/02/2018, right 02/15/2018  . HIP ARTHROSCOPY W/ LABRAL REPAIR Right 05/11/2013   acetabular labral tear 03/30/2013  . KNEE ARTHROPLASTY Right   . KNEE JOINT MANIPULATION Left    x3 under anesthesia  . KNEE SURGERY Bilateral 1984   Right ACL, left PCL repair  . LITHOTRIPSY  2005  . LIVER BIOPSY   2013   normal results.  [name] MASTECTOMY Bilateral    prior to 2009  . MOUTH SURGERY    . NASAL SEPTUM SURGERY N/A 09/20/2015   by ENT Dr. [name]  . OVARIAN CYST SURGERY Left    size of grapefruit, was informed that she had shortened vagina  . SHOULDER SURGERY Bilateral    Right 08/15/2016, Left 11/15/2016  . THUMB ARTHROSCOPY Left   . THYROIDECTOMY, PARTIAL Left 2008  . TOTAL KNEE ARTHROPLASTY Right 08/23/2018   Procedure: TOTAL KNEE ARTHROPLASTY;  Surgeon: [name], MD;  Location: [facility];  Service: Orthopedics;  Laterality: Right;  67mn  . TOTAL KNEE REVISION Left 02/06/2016   Procedure: LEFT TOTAL KNEE REVISION;  Surgeon: FGaynelle Arabian MD;  Location: [facility];  Service: Orthopedics;  Laterality: Left;  . TOTAL KNEE REVISION Left 04/22/2017   Procedure: Left knee polyethylene revision;  Surgeon: AGaynelle Arabian MD;  Location: [facility];  Service: Orthopedics;  Laterality: Left;  . UPPER GI ENDOSCOPY  2003    There were no vitals filed for this visit.  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 PMH: Psoriatic arthritis, Polycythemia vera, CVA, s/p Rt TKR 08/23/18, Lt knee revision 04/2017, lumbar radiculitis, multiple scaphoid fx's Rt thumb, chronic pain    How long can you walk comfortably?  can walk around the house - furniture walking    Diagnostic tests  MRI cervical spine 07/2017: Unchanged C4 through C6 cervical spinal cord syrinx since the MRIin March 2016, and stable by report since October 2015., MRI brain 2019: No acute intracranial abnormality and stable noncontrast MRI appearance of the brain since 2017.    Patient Stated Goals  can't get up without using his hands, wants to be able to get out of his chair without using his hands - less pain in legs and back, be  stronger.    Currently in Pain?  No/denies    Pain Onset  More than a month ago          [facility] 87.6 degrees  Patient seen for  aquatic therapy today.  Treatment took place in water 2.5-4 feet deep depending upon activity.  Pt entered and exited  the pool via ramp negotiation with use of Hand rails with supervision   Pt performed runner's stretch for hamstring and heel cord stretching - 30 sec hold 2 reps on RLE & LLE  Pt gait trained in pool - 51mx 4 reps; bar bells used for increased resistance for strengthening and also to fascilitate reciprocal arm swing in gait   Pt performed marching in place 10 reps each leg - cues to perform slowly to improve SLS Buoyant ankle cuff used on each leg - 15 reps for hip strengthening - flexion, extension, abduction, hip and knee flexion/extension with knee flexed Squats x 10 reps with min UE support on pool edge   Braiding approx. 30' x 2 reps without UE support  Pt performed Ai Chi postures of soothing 10 reps and balancing 5 reps, then alternating flexion/extension with each leg; pt had some LOB with this exercise but able to  Recover independently  Pt requires the bouyancy of water for support and for reduced fall risk with balance and unsupported standing activities; buoyancy of water also needed for  spinal decompression for reduced pain with weight bearing activities.  Viscosity of water needed for resistance for strengthening exercises.                                 PT Short Term Goals - 07/05/19 2043      PT SHORT TERM GOAL #1   Title  Pt will be independent with initial HEP for improved strength, balance, and gait. ALL STGS DUE 04/06/19    Time  4   due to delay in scheduling   Period  Weeks    Status  Achieved    Target Date  04/06/19      PT SHORT TERM GOAL #2   Title  Patient will undergo further assessment of TUG with use of RW vs. BERG  in order to determine fall  risk - goal written as appropriate.    Baseline  performed TUG with RW    Time  4    Period  Weeks    Status  Achieved      PT SHORT TERM GOAL #3   Title  Patient will verbalize understanding of fall prevention strategies in the home.    Time  4    Period  Weeks    Status  Deferred      PT SHORT TERM GOAL #4   Title  Patient will ambulate at least 552 with RW with supervision in order to safely ambulate at home.    Baseline  pt has ambulated 115' in clinic with min guard    Time  4    Period  Weeks    Status  Partially Met      PT SHORT TERM GOAL #5   Title  Patient will perform 4 steps with supervision using single railing and step to pattern in order to increase safety at home with stairs.    Baseline  4 steps with supervision/min guard with BUE support on railings and step to pattern.    Time  4    Period  Weeks    Status  Not Met      PT SHORT TERM GOAL #6   Title  Perform 5x sit to stand from standard arm chair  vs. mat table and write goal as appropriate.    Baseline  met on 04/08/19    Time  4    Period  Weeks    Status  Achieved        PT Long Term Goals - 07/05/19 2043      PT LONG TERM GOAL #1   Title  Pt will be independent with final HEP for land/aquatic therapy for improved strength, ROM, balance, and gait. ALL LTGS DUE 06/24/19    Baseline  pt independent with HEP for land therapy - not quite yet for aquatic therapy (has only had a couple sessions)    Time  8   due to delay in scheduling   Period  Weeks    Status  On-going      PT LONG TERM GOAL #2   Title  Pt will decr TUG score with RW to 21 seconds or less with RW to decr fall risk.    Baseline  23.66 seconds with RW on 04/29/19    Time  8    Period  Weeks    Status  Revised      PT LONG TERM GOAL #3   Title  Patient will ambulate at least 150' with RW with mod I in order to safely ambulate at home.    Baseline  115' throughout with RW with supervision/min guard    Time  8    Period  Weeks     Status  On-going      PT LONG TERM GOAL #4   Title  Patient will perform 12 steps with mod I  using BUE suport on railing and step to pattern in order to increase safety at home with stairs.    Baseline  8 steps with supervision with B railings and step to pattern    Time  8    Period  Weeks    Status  On-going      PT LONG TERM GOAL #5   Title  Pt will undergo assessment of gait speed with RW- goal to be written as appropriate.    Time  8    Period  Weeks    Status  New      PT LONG TERM GOAL #6   Title  Pt will decr 5x sit <> stand time from 28 seconds or less from mat table using BUE support to demo improved functional LE strength.   31 seconds on 04/29/19   Time  8    Period  Weeks    Status  Not Met            Plan - 07/05/19 2033    Clinical Impression Statement  Pt demonstrated ability to independently recover LOB which occurred with braiding exercise - 1 LOB occurrence only.  Pt is progressing well towards LTG's - wishes to focus on balance exercises in next aquatic sessions.    PT Frequency  1x / week    PT Duration  6 weeks    PT Treatment/Interventions  ADLs/Self Care Home Management;Neuromuscular re-education;Therapeutic activities;Therapeutic exercise;Patient/family education;Manual techniques;Scar mobilization;Passive range of motion;Manual lymph drainage;Electrical Stimulation;Aquatic Therapy;Energy conservation;Gait training;Stair training;Functional mobility training;DME Instruction;Balance training;Orthotic Fit/Training    PT Next Visit Plan  Rt wrist sx 05-11-19:   fall prevention strategies in the home. continue LE strengthening, gait training (walking around obstacles),  standing balance in // bars, fall prevention strategy handout. Nustep for strengthening/endurance. core strengthening       Patient will benefit from skilled  therapeutic intervention in order to improve the following deficits and impairments:     Visit Diagnosis: Other abnormalities of gait  and mobility  Unsteadiness on feet  Muscle weakness (generalized)     Problem List Patient Active Problem List   Diagnosis Date Noted  . Chronic diastolic heart failure ([place]) 05/24/2019  . Urinary dysfunction 04/12/2019  . Osteoarthritis of right knee 08/23/2018  . Constipation due to opioid therapy 03/30/2018  . Retinopathy of both eyes 01/06/2018  . SNHL (sensorineural hearing loss) 12/04/2017  . Abnormal urinary stream 12/03/2017  . Osteoarthritis of carpometacarpal (CMC) joint of thumb 11/30/2017  . Muscle weakness 11/17/2017  . Transient vision disturbance 11/12/2017  . Bilateral hand pain 10/30/2017  . Pain of left hip joint 10/09/2017  . Gynecomastia 07/10/2017  . Iron deficiency anemia 07/05/2017  . Spasticity 05/20/2017  . Lumbar radiculitis 04/20/2017  . Ulnar 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, left 11/28/2016  . Idiopathic peripheral neuropathy 11/28/2016  . 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 Long term (current) use of anticoagulants 08/23/2015  . Right upper quadrant abdominal pain 08/23/2015  . Memory loss 05/10/2015  . Gait abnormality 04/07/2015  . Alkaline phosphatase elevation 04/07/2015  . History of thrombosis 03/26/2015  . Medial epicondylitis 02/07/2015  . Cognitive decline 12/21/2014  . Leukopenia 12/05/2014  . Rotator cuff syndrome of right shoulder 10/27/2014  . Status post left knee replacement 08/22/2014  . Left lateral epicondylitis 08/22/2014  . Chronic cerebral ischemia 08/18/2014  . Arthrofibrosis of knee joint 08/17/2014  . Cubital canal compression syndrome, left 08/17/2014  . 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 Chronic pain syndrome 04/10/2014  . Insomnia 04/10/2014  . Chronic non-specific white matter lesions on MRI 04/10/2014  . CFS (chronic fatigue syndrome) 04/10/2014  . 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 Biceps tendonitis on left 03/01/2014  . Polycythemia vera ([place]) 12/27/2013  . H/O TIA (transient ischemic  attack) and stroke 12/27/2013  . Neck pain 12/27/2013  . OSA (obstructive sleep apnea) 12/08/2013  . 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 Depression with anxiety 08/04/2013  . Protein C deficiency ([name]) 08/01/2013  . Post traumatic stress disorder (PTSD) 08/01/2013  . Speech abnormality 07/25/2013  . Obesity 05/10/2013  . Lower extremity edema 05/10/2013  . GERD (gastroesophageal reflux disease) 05/10/2013  . Arthritis 05/10/2013  . OA (osteoarthritis) of knee 03/15/2013  . Headache 10/25/2012  . Palpitations 10/18/2012  . Fatty liver determined by biopsy 06/01/2012  . Arthropathic psoriasis, unspecified ([place]) 04/29/2012  . Abnormal liver enzymes 03/29/2012  . Psoriatic arthritis ([place]) 12/29/2011  . Left Renal Hydronephrosis 12/11/2010  . 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 Celiac disease 05/27/2010  . Thyroid nodule 05/27/2010  . Male-to-male transgender person 09/20/2002    Alda [name], [place], [place] 07/05/2019, 8:47 PM  [facility] [address] [place], [place], [address] Phone: [phone]   Fax:  [phone]  Name: Jesse Bowers MRN: [id number] Date of Birth: [birth year]

## 2019-07-07 ENCOUNTER — Ambulatory Visit (INDEPENDENT_AMBULATORY_CARE_PROVIDER_SITE_OTHER): Payer: BC Managed Care – PPO | Admitting: Psychology

## 2019-07-07 DIAGNOSIS — F4323 Adjustment disorder with mixed anxiety and depressed mood: Secondary | ICD-10-CM | POA: Diagnosis not present

## 2019-07-07 NOTE — Telephone Encounter (Signed)
Opened in error

## 2019-07-11 ENCOUNTER — Ambulatory Visit: Payer: BC Managed Care – PPO | Admitting: Physical Therapy

## 2019-07-12 ENCOUNTER — Ambulatory Visit (INDEPENDENT_AMBULATORY_CARE_PROVIDER_SITE_OTHER): Payer: BC Managed Care – PPO | Admitting: Psychology

## 2019-07-12 DIAGNOSIS — F4312 Post-traumatic stress disorder, chronic: Secondary | ICD-10-CM

## 2019-07-14 ENCOUNTER — Ambulatory Visit (INDEPENDENT_AMBULATORY_CARE_PROVIDER_SITE_OTHER): Payer: BC Managed Care – PPO | Admitting: Psychology

## 2019-07-14 DIAGNOSIS — F4312 Post-traumatic stress disorder, chronic: Secondary | ICD-10-CM

## 2019-07-18 ENCOUNTER — Other Ambulatory Visit: Payer: Self-pay

## 2019-07-18 ENCOUNTER — Ambulatory Visit: Payer: BC Managed Care – PPO | Admitting: Physical Therapy

## 2019-07-18 DIAGNOSIS — R2689 Other abnormalities of gait and mobility: Secondary | ICD-10-CM | POA: Diagnosis not present

## 2019-07-18 DIAGNOSIS — R2681 Unsteadiness on feet: Secondary | ICD-10-CM

## 2019-07-19 ENCOUNTER — Encounter: Payer: Self-pay | Admitting: Physical Therapy

## 2019-07-19 ENCOUNTER — Ambulatory Visit (INDEPENDENT_AMBULATORY_CARE_PROVIDER_SITE_OTHER): Payer: BC Managed Care – PPO | Admitting: Psychology

## 2019-07-19 DIAGNOSIS — F4312 Post-traumatic stress disorder, chronic: Secondary | ICD-10-CM

## 2019-07-19 NOTE — Therapy (Signed)
[facility] [address] [place], [place], [address] Phone: [phone]   Fax:  [phone]  Physical Therapy Treatment  Patient Details  Name: Jesse Bowers MRN: [id number] Date of Birth: March 10, 1965 Referring Provider (PT): Clerance Lav, MD   Encounter Date: 07/18/2019  PT End of Session - 07/19/19 2224    Visit Number  19   visit 6 since 05-02-19   Number of Visits  24   visit 1 in March   Date for PT Re-Evaluation  06/28/19    Authorization Type  [employer] primary -September 2020-Feb 28th 2021, [facility] secondary (however may lose part B) - 30 visit limit combined PT/OT (Same day counts as 1 visit)    Authorization - Visit Number  6   visit 2 in March   Authorization - Number of Visits  30    PT Start Time  1605   pt arrived 70" late for appt   PT Stop Time  1635    PT Time Calculation (min)  30 min    Equipment Utilized During Treatment  --   buoyancy cuffs, hand barbells   Activity Tolerance  Patient tolerated treatment well    Behavior During Therapy  WFL for tasks assessed/performed       Past Medical History:  Diagnosis Date  . Abnormal weight loss   . Anxiety   . Arthritis   . Cataract    OU  . Celiac disease   . Cervical neck pain with evidence of disc disease    patient has a cyst   . Chronic constipation   . Chronic diastolic heart failure ([place])    Pt. denies  . Chronic pain   . Degenerative disc disease at L5-S1 level    with stenosis  . DVT (deep venous thrombosis) ([facility])    Right upper arm, bilateral leg  . Eczema    inguinal, feet  . Elevated liver enzymes   . 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 Family history of adverse reaction to anesthesia    family has problems with anesthesia of nausea and vomiting   . 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 GERD (gastroesophageal reflux disease)    History of in 20's  . Gluten enteropathy   . H/O parotitis    right   . Hard of hearing    . History of kidney stones   . History of retinal tear    Bilateral  . History of staph infection    required wound vac  . Hx-TIA (transient ischemic attack)    2015  . LVH (left ventricular hypertrophy) 12/15/2016   Mild, noted on ECHO  . MVP (mitral valve prolapse)   . NAFL (nonalcoholic fatty liver)   . Neck pain   . Neuromuscular disorder ([place])    bilateral neuropathy feet.  . Pneumonia 12/17/2010  . Polycythemia   . Polycythemia, secondary   . PONV (postoperative nausea and vomiting)   . Protein C deficiency ([place])    Dr. Anne Fu  . Psoriasis    16 X10 cm psoriatic rash on sole of left foot ; open and occ scant bleeding;   . psoriatic arthritis   . PTSD (post-traumatic stress disorder)   . Scaphoid fracture of wrist 09/23/2013  . Seizure ([place])    childhood, medication until age 36 then weanned completely off  . Sleep apnea    split night study last done by Dr. [name] 06/18/15 shows severe OSA, CSA, and hypersomnia, rec  bipap  . Splenomegaly   . Stenosis of ureteropelvic junction (UPJ)    left  . Stroke [facility])    CVA vs TIA in left cerebrum causing slight right sided weakness-Dr. [name] follows  . Syrinx of spinal cord ([place]) 01/06/2014   c spine on MRI  . Tachycardia    hx of   . Transfusion history    past history- none recent, after surgeries due to blood loss  . Wears glasses   . Wears hearing aid     Past Surgical History:  Procedure Laterality Date  . ABDOMINAL HYSTERECTOMY Bilateral 1994   TAH, BSO- tranverse incision at 54 yo  . ANKLE ARTHROSCOPY WITH RECONSTRUCTION Right 2007  . CHOLECYSTECTOMY     laparoscopic  . COLONOSCOPY     x3  . EYE SURGERY     Left eye 03/02/2018, right 02/15/2018  . HIP ARTHROSCOPY W/ LABRAL REPAIR Right 05/11/2013   acetabular labral tear 03/30/2013  . KNEE ARTHROPLASTY Right   . KNEE JOINT MANIPULATION Left    x3 under anesthesia  . KNEE SURGERY Bilateral 1984   Right ACL, left PCL repair  . LITHOTRIPSY  2005  .  LIVER BIOPSY  2013   normal results.  [name] MASTECTOMY Bilateral    prior to 2009  . MOUTH SURGERY    . NASAL SEPTUM SURGERY N/A 09/20/2015   by ENT Dr. [name]  . OVARIAN CYST SURGERY Left    size of grapefruit, was informed that she had shortened vagina  . SHOULDER SURGERY Bilateral    Right 08/15/2016, Left 11/15/2016  . THUMB ARTHROSCOPY Left   . THYROIDECTOMY, PARTIAL Left 2008  . TOTAL KNEE ARTHROPLASTY Right 08/23/2018   Procedure: TOTAL KNEE ARTHROPLASTY;  Surgeon: [name], MD;  Location: [facility];  Service: Orthopedics;  Laterality: Right;  11mn  . TOTAL KNEE REVISION Left 02/06/2016   Procedure: LEFT TOTAL KNEE REVISION;  Surgeon: FGaynelle Arabian MD;  Location: [facility];  Service: Orthopedics;  Laterality: Left;  . TOTAL KNEE REVISION Left 04/22/2017   Procedure: Left knee polyethylene revision;  Surgeon: AGaynelle Arabian MD;  Location: [facility];  Service: Orthopedics;  Laterality: Left;  . UPPER GI ENDOSCOPY  2003    There were no vitals filed for this visit.  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 PMH: Psoriatic arthritis, Polycythemia vera, CVA, s/p Rt TKR 08/23/18, Lt knee revision 04/2017, lumbar radiculitis, multiple scaphoid fx's Rt thumb, chronic pain    How long can you walk comfortably?  can walk around the house - furniture walking    Diagnostic tests  MRI cervical spine 07/2017: Unchanged C4 through C6 cervical spinal cord syrinx since the MRIin March 2016, and stable by report since October 2015., MRI brain 2019: No acute intracranial abnormality and stable noncontrast MRI appearance of the brain since 2017.    Patient Stated Goals  can't get up without using his hands, wants to be able to get out of his chair without using his hands - less pain in legs and  back, be stronger.    Currently in Pain?  No/denies    Pain Onset  More than a month ago               Aquatic  therapy at [facility] [place] - pool temp. 87.6 degrees  Patient seen for aquatic therapy today.  Treatment took place in water 3.5-4 feet deep depending upon activity.  Pt entered and exited  the pool via ramp negotiation with use of Hand rails with supervision   Pt performed runner's stretch for hamstring and heel cord stretching - 30 sec hold 2 reps on RLE & LLE  Pt gait trained in pool - 61mx 1 reps; bar bells used for increased resistance for strengthening and also to fascilitate reciprocal arm swing in gait   Pt performed marching in place 10 reps each leg - cues to perform slowly to improve SLS   Braiding approx. 30' x 2 reps without UE support  Pt performed Ai Chi posture of  balancing re 10ps, then alternating flexion/extension with each leg; pt had some LOB with this exercise but able to  Recover independently  Pt requires the bouyancy of water for support and for reduced fall risk with balance and unsupported standing activities; buoyancy of water also needed for  spinal decompression for reduced pain with weight bearing activities.  Viscosity of water needed for resistance for strengthening exercises.                            PT Short Term Goals - 07/19/19 2228      PT SHORT TERM GOAL #1   Title  Pt will be independent with initial HEP for improved strength, balance, and gait. ALL STGS DUE 04/06/19    Time  4   due to delay in scheduling   Period  Weeks    Status  Achieved    Target Date  04/06/19      PT SHORT TERM GOAL #2   Title  Patient will undergo further assessment of TUG with use of RW vs. BERG  in order to determine fall risk - goal written as appropriate.    Baseline  performed TUG with RW    Time  4    Period  Weeks    Status  Achieved      PT SHORT TERM GOAL #3   Title  Patient will verbalize understanding of fall  prevention strategies in the home.    Time  4    Period  Weeks    Status  Deferred      PT SHORT TERM GOAL #4   Title  Patient will ambulate at least 519 with RW with supervision in order to safely ambulate at home.    Baseline  pt has ambulated 115' in clinic with min guard    Time  4    Period  Weeks    Status  Partially Met      PT SHORT TERM GOAL #5   Title  Patient will perform 4 steps with supervision using single railing and step to pattern in order to increase safety at home with stairs.    Baseline  4 steps with supervision/min guard with BUE support on railings and step to pattern.    Time  4    Period  Weeks    Status  Not Met      PT SHORT TERM GOAL #6   Title  Perform 5x sit to stand from standard arm chair vs. mat table and write goal as appropriate.    Baseline  met on 04/08/19    Time  4    Period  Weeks  Status  Achieved        PT Long Term Goals - 07/19/19 2228      PT LONG TERM GOAL #1   Title  Pt will be independent with final HEP for land/aquatic therapy for improved strength, ROM, balance, and gait. ALL LTGS DUE 06/24/19    Baseline  pt independent with HEP for land therapy - not quite yet for aquatic therapy (has only had a couple sessions)    Time  8   due to delay in scheduling   Period  Weeks    Status  On-going      PT LONG TERM GOAL #2   Title  Pt will decr TUG score with RW to 21 seconds or less with RW to decr fall risk.    Baseline  23.66 seconds with RW on 04/29/19    Time  8    Period  Weeks    Status  Revised      PT LONG TERM GOAL #3   Title  Patient will ambulate at least 150' with RW with mod I in order to safely ambulate at home.    Baseline  115' throughout with RW with supervision/min guard    Time  8    Period  Weeks    Status  On-going      PT LONG TERM GOAL #4   Title  Patient will perform 12 steps with mod I  using BUE suport on railing and step to pattern in order to increase safety at home with stairs.    Baseline  8  steps with supervision with B railings and step to pattern    Time  8    Period  Weeks    Status  On-going      PT LONG TERM GOAL #5   Title  Pt will undergo assessment of gait speed with RW- goal to be written as appropriate.    Time  8    Period  Weeks    Status  New      PT LONG TERM GOAL #6   Title  Pt will decr 5x sit <> stand time from 28 seconds or less from mat table using BUE support to demo improved functional LE strength.   31 seconds on 04/29/19   Time  8    Period  Weeks    Status  Not Met            Plan - 07/19/19 2227    Clinical Impression Statement  Pt tolerated balance exercises in pool well with pt demonstrating ability to recover LOB independently    PT Frequency  1x / week    PT Duration  6 weeks    PT Treatment/Interventions  ADLs/Self Care Home Management;Neuromuscular re-education;Therapeutic activities;Therapeutic exercise;Patient/family education;Manual techniques;Scar mobilization;Passive range of motion;Manual lymph drainage;Electrical Stimulation;Aquatic Therapy;Energy conservation;Gait training;Stair training;Functional mobility training;DME Instruction;Balance training;Orthotic Fit/Training    PT Next Visit Plan  Rt wrist sx 05-11-19:   fall prevention strategies in the home. continue LE strengthening, gait training (walking around obstacles),  standing balance in // bars, fall prevention strategy handout. Nustep for strengthening/endurance. core strengthening       Patient will benefit from skilled therapeutic intervention in order to improve the following deficits and impairments:     Visit Diagnosis: Other abnormalities of gait and mobility  Unsteadiness on feet     Problem List Patient Active Problem List   Diagnosis Date Noted  . Chronic diastolic heart failure ([place]) 05/24/2019  .  Urinary dysfunction 04/12/2019  . Osteoarthritis of right knee 08/23/2018  . Constipation due to opioid therapy 03/30/2018  . Retinopathy of both eyes  01/06/2018  . SNHL (sensorineural hearing loss) 12/04/2017  . Abnormal urinary stream 12/03/2017  . Osteoarthritis of carpometacarpal (CMC) joint of thumb 11/30/2017  . Muscle weakness 11/17/2017  . Transient vision disturbance 11/12/2017  . Bilateral hand pain 10/30/2017  . Pain of left hip joint 10/09/2017  . Gynecomastia 07/10/2017  . Iron deficiency anemia 07/05/2017  . Spasticity 05/20/2017  . Lumbar radiculitis 04/20/2017  . Ulnar 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, left 11/28/2016  . Idiopathic peripheral neuropathy 11/28/2016  . 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 Long term (current) use of anticoagulants 08/23/2015  . Right upper quadrant abdominal pain 08/23/2015  . Memory loss 05/10/2015  . Gait abnormality 04/07/2015  . Alkaline phosphatase elevation 04/07/2015  . History of thrombosis 03/26/2015  . Medial epicondylitis 02/07/2015  . Cognitive decline 12/21/2014  . Leukopenia 12/05/2014  . Rotator cuff syndrome of right shoulder 10/27/2014  . Status post left knee replacement 08/22/2014  . Left lateral epicondylitis 08/22/2014  . Chronic cerebral ischemia 08/18/2014  . Arthrofibrosis of knee joint 08/17/2014  . Cubital canal compression syndrome, left 08/17/2014  . 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 Chronic pain syndrome 04/10/2014  . Insomnia 04/10/2014  . Chronic non-specific white matter lesions on MRI 04/10/2014  . CFS (chronic fatigue syndrome) 04/10/2014  . 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 Biceps tendonitis on left 03/01/2014  . Polycythemia vera ([place]) 12/27/2013  . H/O TIA (transient ischemic attack) and stroke 12/27/2013  . Neck pain 12/27/2013  . OSA (obstructive sleep apnea) 12/08/2013  . 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 Depression with anxiety 08/04/2013  . Protein C deficiency ([place]) 08/01/2013  . Post traumatic stress disorder (PTSD) 08/01/2013  . Speech abnormality 07/25/2013  . Obesity 05/10/2013  . Lower extremity edema  05/10/2013  . GERD (gastroesophageal reflux disease) 05/10/2013  . Arthritis 05/10/2013  . OA (osteoarthritis) of knee 03/15/2013  . Headache 10/25/2012  . Palpitations 10/18/2012  . Fatty liver determined by biopsy 06/01/2012  . Arthropathic psoriasis, unspecified ([place]) 04/29/2012  . Abnormal liver enzymes 03/29/2012  . Psoriatic arthritis ([place]) 12/29/2011  . Left Renal Hydronephrosis 12/11/2010  . 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 Celiac disease 05/27/2010  . Thyroid nodule 05/27/2010  . Male-to-male transgender person 09/20/2002    Alda [name], [place], [place] 07/19/2019, 10:30 PM  [facility] [address] [name], [place], [address] Phone: [phone]   Fax:  [phone]  Name: Kamron Vanwyhe MRN: [id number] Date of Birth: 13-Dec-1965

## 2019-07-20 ENCOUNTER — Other Ambulatory Visit: Payer: Self-pay

## 2019-07-20 ENCOUNTER — Encounter (INDEPENDENT_AMBULATORY_CARE_PROVIDER_SITE_OTHER): Payer: BC Managed Care – PPO | Admitting: Ophthalmology

## 2019-07-20 ENCOUNTER — Encounter: Payer: BC Managed Care – PPO | Attending: Physical Medicine & Rehabilitation | Admitting: Registered Nurse

## 2019-07-20 ENCOUNTER — Encounter: Payer: Self-pay | Admitting: Registered Nurse

## 2019-07-20 VITALS — BP 127/85 | HR 104 | Temp 97.5°F | Ht 69.0 in | Wt 210.0 lb

## 2019-07-20 DIAGNOSIS — M7062 Trochanteric bursitis, left hip: Secondary | ICD-10-CM

## 2019-07-20 DIAGNOSIS — D6859 Other primary thrombophilia: Secondary | ICD-10-CM | POA: Diagnosis not present

## 2019-07-20 DIAGNOSIS — Z96659 Presence of unspecified artificial knee joint: Secondary | ICD-10-CM

## 2019-07-20 DIAGNOSIS — M545 Low back pain: Secondary | ICD-10-CM | POA: Insufficient documentation

## 2019-07-20 DIAGNOSIS — M7522 Bicipital tendinitis, left shoulder: Secondary | ICD-10-CM | POA: Diagnosis not present

## 2019-07-20 DIAGNOSIS — R252 Cramp and spasm: Secondary | ICD-10-CM | POA: Diagnosis present

## 2019-07-20 DIAGNOSIS — D751 Secondary polycythemia: Secondary | ICD-10-CM | POA: Diagnosis not present

## 2019-07-20 DIAGNOSIS — M5416 Radiculopathy, lumbar region: Secondary | ICD-10-CM | POA: Insufficient documentation

## 2019-07-20 DIAGNOSIS — Z7901 Long term (current) use of anticoagulants: Secondary | ICD-10-CM | POA: Insufficient documentation

## 2019-07-20 DIAGNOSIS — T84018S Broken internal joint prosthesis, other site, sequela: Secondary | ICD-10-CM | POA: Diagnosis not present

## 2019-07-20 DIAGNOSIS — G8929 Other chronic pain: Secondary | ICD-10-CM | POA: Diagnosis present

## 2019-07-20 DIAGNOSIS — M13862 Other specified arthritis, left knee: Secondary | ICD-10-CM | POA: Diagnosis not present

## 2019-07-20 DIAGNOSIS — Z86718 Personal history of other venous thrombosis and embolism: Secondary | ICD-10-CM | POA: Insufficient documentation

## 2019-07-20 DIAGNOSIS — G609 Hereditary and idiopathic neuropathy, unspecified: Secondary | ICD-10-CM | POA: Diagnosis not present

## 2019-07-20 DIAGNOSIS — F418 Other specified anxiety disorders: Secondary | ICD-10-CM | POA: Insufficient documentation

## 2019-07-20 DIAGNOSIS — M7061 Trochanteric bursitis, right hip: Secondary | ICD-10-CM

## 2019-07-20 DIAGNOSIS — S43002A Unspecified subluxation of left shoulder joint, initial encounter: Secondary | ICD-10-CM | POA: Diagnosis not present

## 2019-07-20 DIAGNOSIS — Z79899 Other long term (current) drug therapy: Secondary | ICD-10-CM | POA: Insufficient documentation

## 2019-07-20 DIAGNOSIS — Z79891 Long term (current) use of opiate analgesic: Secondary | ICD-10-CM

## 2019-07-20 DIAGNOSIS — I69320 Aphasia following cerebral infarction: Secondary | ICD-10-CM | POA: Diagnosis not present

## 2019-07-20 DIAGNOSIS — Z9181 History of falling: Secondary | ICD-10-CM | POA: Insufficient documentation

## 2019-07-20 DIAGNOSIS — Z5181 Encounter for therapeutic drug level monitoring: Secondary | ICD-10-CM | POA: Diagnosis present

## 2019-07-20 DIAGNOSIS — M25512 Pain in left shoulder: Secondary | ICD-10-CM

## 2019-07-20 DIAGNOSIS — K900 Celiac disease: Secondary | ICD-10-CM | POA: Insufficient documentation

## 2019-07-20 DIAGNOSIS — I69398 Other sequelae of cerebral infarction: Secondary | ICD-10-CM | POA: Insufficient documentation

## 2019-07-20 DIAGNOSIS — I69351 Hemiplegia and hemiparesis following cerebral infarction affecting right dominant side: Secondary | ICD-10-CM | POA: Insufficient documentation

## 2019-07-20 DIAGNOSIS — R209 Unspecified disturbances of skin sensation: Secondary | ICD-10-CM | POA: Diagnosis not present

## 2019-07-20 DIAGNOSIS — Z87890 Personal history of sex reassignment: Secondary | ICD-10-CM | POA: Insufficient documentation

## 2019-07-20 DIAGNOSIS — Z96652 Presence of left artificial knee joint: Secondary | ICD-10-CM

## 2019-07-20 DIAGNOSIS — L4050 Arthropathic psoriasis, unspecified: Secondary | ICD-10-CM | POA: Diagnosis present

## 2019-07-20 DIAGNOSIS — Z96651 Presence of right artificial knee joint: Secondary | ICD-10-CM

## 2019-07-20 DIAGNOSIS — G894 Chronic pain syndrome: Secondary | ICD-10-CM

## 2019-07-20 MED ORDER — OXYCODONE HCL 10 MG PO TABS: 10.0000 mg | ORAL_TABLET | Freq: Three times a day (TID) | ORAL | 0 refills | Status: DC | PRN

## 2019-07-20 MED ORDER — MORPHINE SULFATE ER 60 MG PO CP24: 60.0000 mg | ORAL_CAPSULE | Freq: Two times a day (BID) | ORAL | 0 refills | Status: DC

## 2019-07-20 MED ORDER — BACLOFEN 20 MG PO TABS: 20.0000 mg | ORAL_TABLET | Freq: Four times a day (QID) | ORAL | 3 refills | Status: DC

## 2019-07-20 NOTE — Progress Notes (Signed)
Subjective:    Patient ID: Jesse Bowers, adult    DOB: 02-08-1966, 54 y.o.   MRN: [id number]  HPI: Jesse Bowers is a 54 y.o. choose not to disclose who returns for follow up appointment for chronic pain and medication refill. He states his pain is located in his left shoulder, lower back pain radiating into his bilateral buttocks and bilateral lower extremities and bilateral hip pain. He rates his pain 7. His current exercise regime is walking, Yoga three days a week and attending [facility] therapy weekly.   Jesse Bowers Morphine equivalent is 165.00 MME. He is also prescribed Clorazepate by Dr. Erling Cruz We have discussed the black box warning of using opioids and benzodiazepines. I highlighted the dangers of using these drugs together and discussed the adverse events including respiratory suppression, overdose, cognitive impairment and importance of compliance with current regimen. We will continue to monitor and adjust as indicated.  He  is being closely monitored and under the care of his psychiatrist Dr Erling Cruz.    Pain Inventory Average Pain 7 Pain Right Now 7 My pain is constant, sharp, burning, stabbing and aching  In the last 24 hours, has pain interfered with the following? General activity 9 Relation with others 10 Enjoyment of life 10 What TIME of day is your pain at its worst? daytime and night Sleep (in general) Poor  Pain is worse with: walking, bending, sitting, standing and some activites Pain improves with: heat/ice, medication and injections Relief from Meds: 5  Mobility walk with assistance ability to climb steps?  yes do you drive?  yes  Function disabled: date disabled . I need assistance with the following:  bathing, meal prep, household duties and shopping  Neuro/Psych bowel control problems weakness numbness tremor tingling trouble walking spasms dizziness anxiety  Prior Studies Any changes since last visit?  yes CT/MRI  Physicians involved in your  care Any changes since last visit?  yes Primary care .   Family History  Problem Relation Age of Onset  . Stroke Maternal Grandfather        90  . Heart attack Maternal Grandfather   . Glaucoma Maternal Grandfather   . Macular degeneration Maternal Grandfather   . Breast cancer Sister   . Hypertension Mother   . Psoriasis Mother   . Other Mother        meningioma developed ~2019  . Glaucoma Mother   . Cancer Paternal Grandfather   . Heart attack Paternal Grandfather   . Stroke Paternal Uncle        age 13  . Polycythemia Paternal Uncle   . Stroke Maternal Grandmother   . Congestive Heart Failure Maternal Grandmother   . Heart attack Maternal Grandmother   . Protein C deficiency Sister 31       Miscarriages  . Breast cancer Maternal Aunt 57   Social History   Socioeconomic History  . Marital status: Married    Spouse name: Not on file  . Number of children: 2  . Years of education: 4y college  . Highest education level: Not on file  Occupational History  . Occupation: Pediatric Nurse practitioner    Comment: Not working since [place] 2015  Tobacco Use  . Smoking status: Never Smoker  . Smokeless tobacco: Never Used  Substance and Sexual Activity  . Alcohol use: Yes    Comment: social  . Drug use: No  . Sexual activity: Yes    Birth control/protection: None    Comment: patient is  a transgender on testosterone shots, no biological kids  Other Topics Concern  . Not on file  Social History Narrative   Education 4 year college, former Therapist, sports X 15 years, pediatric nurse practitioner x 6 years, did NP degree from [employer] [place]. Relocated to [place] about 2 months ago from [place], [place]. Patient was in [place] for last 4 years and prior to that in [place]. His wife is working as Scientist, research (physical sciences) for [employer]. Patient is not working and applying for disability. They have 2 kids but no biologic children.    Social Determinants of Health   Financial Resource Strain:    . Difficulty of Paying Living Expenses:   Food Insecurity:   . Worried About Charity fundraiser in the Last Year:   . Arboriculturist in the Last Year:   Transportation Needs:   . Film/video editor (Medical):   [name] Lack of Transportation (Non-Medical):   Physical Activity:   . Days of Exercise per Week:   . Minutes of Exercise per Session:   Stress:   . Feeling of Stress :   Social Connections:   . Frequency of Communication with Friends and Family:   . Frequency of Social Gatherings with Friends and Family:   . Attends Religious Services:   . Active Member of Clubs or Organizations:   . Attends Archivist Meetings:   [name] Marital Status:    Past Surgical History:  Procedure Laterality Date  . ABDOMINAL HYSTERECTOMY Bilateral 1994   TAH, BSO- tranverse incision at 54 yo  . ANKLE ARTHROSCOPY WITH RECONSTRUCTION Right 2007  . CHOLECYSTECTOMY     laparoscopic  . COLONOSCOPY     x3  . EYE SURGERY     Left eye 03/02/2018, right 02/15/2018  . HIP ARTHROSCOPY W/ LABRAL REPAIR Right 05/11/2013   acetabular labral tear 03/30/2013  . KNEE ARTHROPLASTY Right   . KNEE JOINT MANIPULATION Left    x3 under anesthesia  . KNEE SURGERY Bilateral 1984   Right ACL, left PCL repair  . LITHOTRIPSY  2005  . LIVER BIOPSY  2013   normal results.  [name] MASTECTOMY Bilateral    prior to 2009  . MOUTH SURGERY    . NASAL SEPTUM SURGERY N/A 09/20/2015   by ENT Dr. [name]  . OVARIAN CYST SURGERY Left    size of grapefruit, was informed that she had shortened vagina  . SHOULDER SURGERY Bilateral    Right 08/15/2016, Left 11/15/2016  . THUMB ARTHROSCOPY Left   . THYROIDECTOMY, PARTIAL Left 2008  . TOTAL KNEE ARTHROPLASTY Right 08/23/2018   Procedure: TOTAL KNEE ARTHROPLASTY;  Surgeon: [name], MD;  Location: [facility];  Service: Orthopedics;  Laterality: Right;  7mn  . TOTAL KNEE REVISION Left 02/06/2016   Procedure: LEFT TOTAL KNEE REVISION;  Surgeon: FGaynelle Arabian MD;  Location: [facility]  [facility];  Service: Orthopedics;  Laterality: Left;  . TOTAL KNEE REVISION Left 04/22/2017   Procedure: Left knee polyethylene revision;  Surgeon: AGaynelle Arabian MD;  Location: [facility];  Service: Orthopedics;  Laterality: Left;  . UPPER GI ENDOSCOPY  2003   Past Medical History:  Diagnosis Date  . Abnormal weight loss   . Anxiety   . Arthritis   . Cataract    OU  . Celiac disease   . Cervical neck pain with evidence of disc disease    patient has a cyst   . Chronic constipation   . Chronic diastolic heart failure ([place]  Pt. denies  . Chronic pain   . Degenerative disc disease at L5-S1 level    with stenosis  . DVT (deep venous thrombosis) ([facility])    Right upper arm, bilateral leg  . Eczema    inguinal, feet  . Elevated liver enzymes   . 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 Family history of adverse reaction to anesthesia    family has problems with anesthesia of nausea and vomiting   . 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 GERD (gastroesophageal reflux disease)    History of in 20's  . Gluten enteropathy   . H/O parotitis    right   . Hard of hearing   . History of kidney stones   . History of retinal tear    Bilateral  . History of staph infection    required wound vac  . Hx-TIA (transient ischemic attack)    2015  . LVH (left ventricular hypertrophy) 12/15/2016   Mild, noted on ECHO  . MVP (mitral valve prolapse)   . NAFL (nonalcoholic fatty liver)   . Neck pain   . Neuromuscular disorder ([place])    bilateral neuropathy feet.  . Pneumonia 12/17/2010  . Polycythemia   . Polycythemia, secondary   . PONV (postoperative nausea and vomiting)   . Protein C deficiency ([place])    Dr. Anne Fu  . Psoriasis    16 X10 cm psoriatic rash on sole of left foot ; open and occ scant bleeding;   . psoriatic arthritis   . PTSD (post-traumatic stress disorder)   . Scaphoid fracture of wrist 09/23/2013  . Seizure ([place])    childhood, medication until age 37 then  weanned completely off  . Sleep apnea    split night study last done by Dr. [name] 06/18/15 shows severe OSA, CSA, and hypersomnia, rec bipap  . Splenomegaly   . Stenosis of ureteropelvic junction (UPJ)    left  . Stroke [facility])    CVA vs TIA in left cerebrum causing slight right sided weakness-Dr. [name] follows  . Syrinx of spinal cord ([place]) 01/06/2014   c spine on MRI  . Tachycardia    hx of   . Transfusion history    past history- none recent, after surgeries due to blood loss  . Wears glasses   . Wears hearing aid    BP 127/85   Pulse (!) 104   Temp (!) 97.5 F (36.4 C)   Ht 5' 9"  (1.753 m)   Wt 210 lb (95.3 kg)   SpO2 94%   BMI 31.01 kg/m   Opioid Risk Score:   Fall Risk Score:  `1  Depression screen PHQ 2/9  No flowsheet data found.  Review of Systems  Musculoskeletal: Positive for gait problem.  Neurological: Positive for dizziness, tremors, weakness and numbness.  Psychiatric/Behavioral: The patient is nervous/anxious.   All other systems reviewed and are negative.      Objective:   Physical Exam Vitals and nursing note reviewed.  Constitutional:      Appearance: Normal appearance.  Cardiovascular:     Rate and Rhythm: Normal rate and regular rhythm.     Pulses: Normal pulses.     Heart sounds: Normal heart sounds.  Pulmonary:     Effort: Pulmonary effort is normal.     Breath sounds: Normal breath sounds.  Musculoskeletal:     Cervical back: Normal range of motion and neck supple.     Comments: Normal Muscle Bulk and Muscle Testing Reveals:  Upper Extremities: Full ROM and Muscle Strength 5/5 Left AC Joint Tenderness Lumbar Paraspinal Tenderness: L-3-L-5 Lower Extremities: Full ROM and Muscle Strength 5/5 Arrived in Wheelchair   Neurological:     Mental Status: He is alert and oriented to person, place, and time.  Psychiatric:        Mood and Affect: Mood normal.        Behavior: Behavior normal.           Assessment & Plan:  1.  Psoriatic arthritis with pain in multiple areas, most prominently feet, hands, elbows. Refilled:Kadian 60 mg 24 hr. Capsule, one capsule every 12 hours #60, and Oxycodone 10 mg one tablet every8hours as needed #90.Second scriptssent for the following month.07/20/2019. We will continue the opioid monitoring program, this consists of regular clinic visits, examinations, urine drug screen, pill counts as well as use of [place] Controlled Substance Reporting System.  Rheumatology Following. 2. Chronic Pain Syndrome: Continue Compound Cream.07/20/2019. 3. 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 S/P TKR on 07/03/14: Ortho Following: Dr. Wynelle Link perform Total Left Knee Revision on 02/06/2016. S/PLeft Knee Revision on 04/22/17.S/PRight TKA on 08/23/2018 with Dr. Wynelle Link. 5. Chronic mid- low back pain: Continue current medication regime, and encourage to increase activity as tolerated.07/20/2019. 6. Polycythemia: Oncology Following.07/20/2019 7. Depression with anxiety :Psychiatry following.Continue to Monitor.07/20/2019 8. Cervicalgia:Continue HEP as tolerated and Continue to Monitor. Continue current medication regime.07/20/2019 9. Muscle Spasticity: ContinueBaclofen05/19/2021 10.RightAnkle Pain:No complaints Today.Continue to Monitor.07/20/2019. 10. Left lateral epicondylitis:No complaints today.Ortho Following.07/20/2019 11.BilateralShoulder Pain:No complaints today.Ortho Following.07/20/2019. 12. Lumbar Radiculitis:S/PLeft L5-S1 TranslaminarInjectionon 07/09/2018. We will continue to Monitor. 07/20/2019. 13.BilateralGreater Trochanteric Bursitis: Continue with Ice and Heat Therapy: Contiue HEP as Tolerated. Continue  to Monitor.07/20/2019.  28mnutes of face to face patient care time was spent during this visit. All questions were encouraged and answered.  F/U in 2 months

## 2019-07-21 ENCOUNTER — Ambulatory Visit: Payer: BC Managed Care – PPO | Admitting: Psychology

## 2019-07-21 ENCOUNTER — Encounter: Payer: Self-pay | Admitting: Cardiovascular Disease

## 2019-07-21 ENCOUNTER — Ambulatory Visit: Payer: BC Managed Care – PPO | Admitting: Cardiovascular Disease

## 2019-07-21 ENCOUNTER — Ambulatory Visit (INDEPENDENT_AMBULATORY_CARE_PROVIDER_SITE_OTHER): Payer: BC Managed Care – PPO | Admitting: Psychology

## 2019-07-21 VITALS — BP 107/74 | HR 88 | Ht 69.0 in | Wt 211.7 lb

## 2019-07-21 DIAGNOSIS — G4731 Primary central sleep apnea: Secondary | ICD-10-CM

## 2019-07-21 DIAGNOSIS — Z7901 Long term (current) use of anticoagulants: Secondary | ICD-10-CM

## 2019-07-21 DIAGNOSIS — M5030 Other cervical disc degeneration, unspecified cervical region: Secondary | ICD-10-CM

## 2019-07-21 DIAGNOSIS — F4312 Post-traumatic stress disorder, chronic: Secondary | ICD-10-CM | POA: Diagnosis not present

## 2019-07-21 DIAGNOSIS — R002 Palpitations: Secondary | ICD-10-CM

## 2019-07-21 DIAGNOSIS — M5136 Other intervertebral disc degeneration, lumbar region: Secondary | ICD-10-CM

## 2019-07-21 DIAGNOSIS — Z8673 Personal history of transient ischemic attack (TIA), and cerebral infarction without residual deficits: Secondary | ICD-10-CM

## 2019-07-21 DIAGNOSIS — I5032 Chronic diastolic (congestive) heart failure: Secondary | ICD-10-CM

## 2019-07-21 DIAGNOSIS — D6859 Other primary thrombophilia: Secondary | ICD-10-CM | POA: Diagnosis not present

## 2019-07-21 DIAGNOSIS — L4050 Arthropathic psoriasis, unspecified: Secondary | ICD-10-CM

## 2019-07-21 NOTE — Patient Instructions (Signed)
  Follow-Up: At [facility], you and your health needs are our priority.  As part of our continuing mission to provide you with exceptional heart care, we have created designated Provider Care Teams.  These Care Teams include your primary Cardiologist (physician) and Advanced Practice Providers (APPs -  Physician Assistants and Nurse Practitioners) who all work together to provide you with the care you need, when you need it.  We recommend signing up for the patient portal called "MyChart".  Sign up information is provided on this After Visit Summary.  MyChart is used to connect with patients for Virtual Visits (Telemedicine).  Patients are able to view lab/test results, encounter notes, upcoming appointments, etc.  Non-urgent messages can be sent to your provider as well.   To learn more about what you can do with MyChart, go to [email or web address].    Your next appointment:   2 month(s)  The format for your next appointment:   In Person  Provider:   Shelva Majestic, MD

## 2019-07-25 ENCOUNTER — Ambulatory Visit: Payer: BC Managed Care – PPO | Admitting: Physical Therapy

## 2019-07-25 ENCOUNTER — Other Ambulatory Visit: Payer: Self-pay

## 2019-07-25 DIAGNOSIS — R2689 Other abnormalities of gait and mobility: Secondary | ICD-10-CM

## 2019-07-25 DIAGNOSIS — R2681 Unsteadiness on feet: Secondary | ICD-10-CM

## 2019-07-26 ENCOUNTER — Ambulatory Visit: Payer: BC Managed Care – PPO | Admitting: Cardiovascular Disease

## 2019-07-26 ENCOUNTER — Encounter: Payer: Self-pay | Admitting: Physical Therapy

## 2019-07-26 ENCOUNTER — Ambulatory Visit: Payer: Medicare Other | Admitting: Psychology
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
## 2019-07-28 ENCOUNTER — Ambulatory Visit (INDEPENDENT_AMBULATORY_CARE_PROVIDER_SITE_OTHER): Payer: BC Managed Care – PPO | Admitting: Psychology

## 2019-07-28 DIAGNOSIS — F4323 Adjustment disorder with mixed anxiety and depressed mood: Secondary | ICD-10-CM

## 2019-08-01 ENCOUNTER — Encounter: Payer: Self-pay | Admitting: Cardiovascular Disease

## 2019-08-01 NOTE — Addendum Note (Signed)
Addended by: Shelva Majestic A on: 08/01/2019 04:48 PM   Modules accepted: Level of Service

## 2019-08-01 NOTE — Progress Notes (Addendum)
Cardiology Office Note    Date:  08/01/2019   ID:  Jesse Bowers, DOB [birth year], MRN [id number]  PCP:  Jesse Knapp, MD  Cardiologist:  Jesse Majestic, MD   New sleep evaluation, referred by Dr. Sallyanne Bowers per patient request  History of Present Illness:  Jesse Bowers is a 54 y.o. adult transgender male to male on chronic androgen therapy who is followed by Dr. Sallyanne Bowers for cardiology care.  He remotely had seen Jesse Bowers at [facility] neurology for sleep apnea.  The patient has desired switching to new sleep provider and is referred to me for initiation of care.  Jesse Bowers has a history of chronic diastolic heart failure, previous stroke on chronic anticoagulation with Xarelto (protein C deficiency) and apparently has a history of sleep apnea.  He states his initial sleep study was done in [place] in 2015.  He has been evaluated at [facility] neurology for his sleep since moving to [place].  Reportedly, on previous evaluation he had obstructive sleep apnea with central apneas and was ultimately placed on ASV mode (EPAP 7 cm with pressure support 4-15 back in 2017.  His last evaluation at [facility] neurology he was dissatisfied and has requested switching providers.  His machine has begun to malfunction and essentially he has not been using therapy.    He has a ResMed air curve 10 ASV unit which reportedly was set at an EPAP of 7 with minimum pressure support 14 with maximum 15.  Over the last 30 days usage was only 1 day and only 1 hour and 24 minutes.  Patient has a history of snoring.  He admits to nonrestorative sleep.  He has had issues with insomnia.  His sleep issues became prominent after he had a stroke.  He does have frequent nocturia and often wakes up with dry mouth or sore throat.  An Epworth Sleepiness Scale score was calculated in the office today and this endorsed at 22 as shown below consistent with severe excessive daytime sleepiness.  Epworth Sleepiness  Scale: Situation   Chance of Dozing/Sleeping (0 = never , 1 = slight chance , 2 = moderate chance , 3 = high chance )   sitting and reading 3   watching TV 3   sitting inactive in a public place 3   being a passenger in a motor vehicle for an hour or more 3   lying down in the afternoon 3   sitting and talking to someone 2   sitting quietly after lunch (no alcohol) 3   while stopped for a few minutes in traffic as the driver 2   Total Score  22   He denies any hypnagogic hallucinations.  He is unaware of restless legs.  He denies any cataplectic events.  An echo Doppler study done on May 04, 2019 showed an EF of 60 to 65% with moderate concentric LVH and grade 1 diastolic dysfunction.   Past Medical History:  Diagnosis Date  . Abnormal weight loss   . Anxiety   . Arthritis   . Cataract    OU  . Celiac disease   . Cervical neck pain with evidence of disc disease    patient has a cyst   . Chronic constipation   . Chronic diastolic heart failure ([place])    Pt. denies  . Chronic pain   . Degenerative disc disease at L5-S1 level    with stenosis  . DVT (deep venous thrombosis) (HCC)    Right upper  arm, bilateral leg  . Eczema    inguinal, feet  . Elevated liver enzymes   . 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 Family history of adverse reaction to anesthesia    family has problems with anesthesia of nausea and vomiting   . 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 GERD (gastroesophageal reflux disease)    History of in 20's  . Gluten enteropathy   . H/O parotitis    right   . Hard of hearing   . History of kidney stones   . History of retinal tear    Bilateral  . History of staph infection    required wound vac  . Hx-TIA (transient ischemic attack)    2015  . LVH (left ventricular hypertrophy) 12/15/2016   Mild, noted on ECHO  . MVP (mitral valve prolapse)   . NAFL (nonalcoholic fatty liver)   . Neck pain   . Neuromuscular disorder ([place])    bilateral  neuropathy feet.  . Pneumonia 12/17/2010  . Polycythemia   . Polycythemia, secondary   . PONV (postoperative nausea and vomiting)   . Protein C deficiency ([place])    Dr. Anne Bowers  . Psoriasis    16 X10 cm psoriatic rash on sole of left foot ; open and occ scant bleeding;   . psoriatic arthritis   . PTSD (post-traumatic stress disorder)   . Scaphoid fracture of wrist 09/23/2013  . Seizure ([place])    childhood, medication until age 78 then weanned completely off  . Sleep apnea    split night study last done by Jesse Bowers 06/18/15 shows severe OSA, CSA, and hypersomnia, rec bipap  . Splenomegaly   . Stenosis of ureteropelvic junction (UPJ)    left  . Stroke [facility])    CVA vs TIA in left cerebrum causing slight right sided weakness-Jesse Bowers follows  . Syrinx of spinal cord ([place]) 01/06/2014   c spine on MRI  . Tachycardia    hx of   . Transfusion history    past history- none recent, after surgeries due to blood loss  . Wears glasses   . Wears hearing aid     Past Surgical History:  Procedure Laterality Date  . ABDOMINAL HYSTERECTOMY Bilateral 1994   TAH, BSO- tranverse incision at 54 yo  . ANKLE ARTHROSCOPY WITH RECONSTRUCTION Right 2007  . CHOLECYSTECTOMY     laparoscopic  . COLONOSCOPY     x3  . EYE SURGERY     Left eye 03/02/2018, right 02/15/2018  . HIP ARTHROSCOPY W/ LABRAL REPAIR Right 05/11/2013   acetabular labral tear 03/30/2013  . KNEE ARTHROPLASTY Right   . KNEE JOINT MANIPULATION Left    x3 under anesthesia  . KNEE SURGERY Bilateral 1984   Right ACL, left PCL repair  . LITHOTRIPSY  2005  . LIVER BIOPSY  2013   normal results.  [name] MASTECTOMY Bilateral    prior to 2009  . MOUTH SURGERY    . NASAL SEPTUM SURGERY N/A 09/20/2015   by ENT Dr. Lucia Bowers  . OVARIAN CYST SURGERY Left    size of grapefruit, was informed that she had shortened vagina  . SHOULDER SURGERY Bilateral    Right 08/15/2016, Left 11/15/2016  . THUMB ARTHROSCOPY Left   . THYROIDECTOMY, PARTIAL  Left 2008  . TOTAL KNEE ARTHROPLASTY Right 08/23/2018   Procedure: TOTAL KNEE ARTHROPLASTY;  Surgeon: [name], MD;  Location: [facility];  Service: Orthopedics;  Laterality: Right;  83mn  . TOTAL KNEE  REVISION Left 02/06/2016   Procedure: LEFT TOTAL KNEE REVISION;  Surgeon: [name], MD;  Location: [facility];  Service: Orthopedics;  Laterality: Left;  . TOTAL KNEE REVISION Left 04/22/2017   Procedure: Left knee polyethylene revision;  Surgeon: [name], MD;  Location: [facility];  Service: Orthopedics;  Laterality: Left;  . UPPER GI ENDOSCOPY  2003    Current Medications: Outpatient Medications Prior to Visit  Medication Sig Dispense Refill  . acyclovir ointment (ZOVIRAX) 5 % Apply 1 application topically daily as needed (cold sores).   0  . antiseptic oral rinse (BIOTENE) LIQD 15 mLs by Mouth Rinse route as needed for dry mouth.    . baclofen (LIORESAL) 20 MG tablet Take 1 tablet (20 mg total) by mouth 4 (four) times daily. 120 tablet 3  . Calcipotriene-Betameth Diprop (ENSTILAR) 0.005-0.064 % FOAM Apply 1 application topically 2 (two) times daily as needed.     . ciprofloxacin-dexamethasone (CIPRODEX) OTIC suspension     . clobetasol (TEMOVATE) 0.05 % GEL Apply 1 application topically 2 (two) times daily as needed (itching).    . clorazepate (TRANXENE) 15 MG tablet Take 15 mg by mouth at bedtime.    . clorazepate (TRANXENE) 7.5 MG tablet Take 7.5 mg by mouth 2 (two) times daily.     . Cyanocobalamin (VITAMIN B 12 PO) Place 5,000 mcg under the tongue daily.    [name] desonide (DESOWEN) 0.05 % ointment Apply 1 application topically 2 (two) times daily as needed (psoriasis).   2  . diclofenac sodium (VOLTAREN) 1 % GEL APPLY TOPICALLY TO BOTH HANDS 3 TIMES DAILY. (Patient taking differently: Apply 1 application topically 3 (three) times daily as needed. ) 300 g 4  . Emollient (CERAVE) CREA Apply 1 application topically 2 (two) times daily as needed.     . ergocalciferol (VITAMIN D2) 1.25 MG  (50000 UT) capsule ergocalciferol (vitamin D2) 1,250 mcg (50,000 unit) capsule    . Fluocinolone Acetonide 0.01 % OIL Place 3 drops into both ears 2 (two) times daily as needed for itching.    . furosemide (LASIX) 20 MG tablet Take 1 tablet (20 mg total) by mouth daily. To be taken with the 40 mg to equal 60 mg daily 90 tablet 1  . furosemide (LASIX) 40 MG tablet Take 1 tablet (40 mg total) by mouth daily. To be taken with the 20 mg to equal 60 mg daily. 90 tablet 1  . morphine (KADIAN) 60 MG 24 hr capsule Take 1 capsule (60 mg total) by mouth every 12 (twelve) hours. 60 capsule 0  . NEEDLE, DISP, 18 G 18G X 1-1/2" MISC 1 Units by Does not apply route once a week. 50 each 2  . NEEDLE, DISP, 23 G 23G X 3/4" MISC 1 Units by Does not apply route once a week. 100 each 1  . neomycin-polymyxin-hydrocortisone (CORTISPORIN) 3.5-10000-1 OTIC suspension Place 4 drops into both ears 2 (two) times daily as needed (ear pain).   1  . Oxycodone HCl 10 MG TABS Take 1 tablet (10 mg total) by mouth every 8 (eight) hours as needed. 85 tablet 0  . potassium chloride (KLOR-CON) 10 MEQ tablet TAKE K-DUR 2 TABLETS 10 MEQ DAILY 60 tablet 11  . Propylene Glycol (SYSTANE COMPLETE) 0.6 % SOLN Place 1-2 drops into both eyes 2 (two) times daily as needed (dry eyes).     . rivaroxaban (XARELTO) 20 MG TABS tablet Take 1 tablet (20 mg total) by mouth at bedtime. TAKE 1 TABLET DAILY  BEFORE BEDTIME 90 tablet 3  . Secukinumab (COSENTYX SENSOREADY PEN) 150 MG/ML SOAJ Inject 300 mg into the skin every 28 (twenty-eight) days.     [name] testosterone cypionate (DEPOTESTOSTERONE CYPIONATE) 200 MG/ML injection INJECT 0.5 MLS (100 MG TOTAL) INTO THE MUSCLE ONCE A WEEK. 10 mL 0  . ursodiol (ACTIGALL) 250 MG tablet Take 250 mg by mouth 2 (two) times daily with a meal.     . ursodiol (ACTIGALL) 500 MG tablet Take 500 mg by mouth 2 (two) times daily with a meal.     . venlafaxine XR (EFFEXOR-XR) 150 MG 24 hr capsule Take 2 capsules (300 mg total) by  mouth daily with breakfast. 60 capsule 5  . augmented betamethasone dipropionate (DIPROLENE-AF) 0.05 % ointment Apply 1 application topically 2 (two) times daily.    . cholecalciferol (VITAMIN D3) 25 MCG (1000 UNIT) tablet Take 1,000 Units by mouth daily.    . Syringe, Disposable, 1 ML MISC 1 Units by Does not apply route once a week. 60 each 2   No facility-administered medications prior to visit.     Allergies:   Gabapentin, Penicillin g, Sulfa antibiotics, Vancomycin, Duloxetine, Tegaderm ag mesh [silver], Wheat bran, Ibuprofen, Nortriptyline, Pregabalin, and Sulfacetamide sodium-sulfur   Social History   Socioeconomic History  . Marital status: Married    Spouse name: Not on file  . Number of children: 2  . Years of education: 4y college  . Highest education level: Not on file  Occupational History  . Occupation: Pediatric Nurse practitioner    Comment: Not working since [place] 2015  Tobacco Use  . Smoking status: Never Smoker  . Smokeless tobacco: Never Used  Substance and Sexual Activity  . Alcohol use: Yes    Comment: social  . Drug use: No  . Sexual activity: Yes    Birth control/protection: None    Comment: patient is a transgender on testosterone shots, no biological kids  Other Topics Concern  . Not on file  Social History Narrative   Education 4 year college, former Therapist, sports X 15 years, pediatric nurse practitioner x 6 years, did NP degree from [employer] [place]. Relocated to [place] about 2 months ago from [place]. Patient was in [place] for last 4 years and prior to that in [place]. His wife is working as Scientist, research (physical sciences) for [employer]. Patient is not working and applying for disability. They have 2 kids but no biologic children.    Social Determinants of Health   Financial Resource Strain:   . Difficulty of Paying Living Expenses:   Food Insecurity:   . Worried About Charity fundraiser in the Last Year:   . Arboriculturist in the Last Year:   Transportation  Needs:   . Film/video editor (Medical):   [name] Lack of Transportation (Non-Medical):   Physical Activity:   . Days of Exercise per Week:   . Minutes of Exercise per Session:   Stress:   . Feeling of Stress :   Social Connections:   . Frequency of Communication with Friends and Family:   . Frequency of Social Gatherings with Friends and Family:   . Attends Religious Services:   . Active Member of Clubs or Organizations:   . Attends Archivist Meetings:   [name] Marital Status:     Socially he was born in [place].  His transgender change was done in [place] at [place] History:  The patient's family history includes Breast cancer  in his sister; Breast cancer (age of onset: 81) in his maternal aunt; Cancer in his paternal grandfather; Congestive Heart Failure in his maternal grandmother; Glaucoma in his maternal grandfather and mother; Heart attack in his maternal grandfather, maternal grandmother, and paternal grandfather; Hypertension in his mother; Macular degeneration in his maternal grandfather; Other in his mother; Polycythemia in his paternal uncle; Protein C deficiency (age of onset: 48) in his sister; Psoriasis in his mother; Stroke in his maternal grandfather, maternal grandmother, and paternal uncle.   ROS General: Negative; No fevers, chills, or night sweats;  HEENT: Negative; No changes in vision or hearing, sinus congestion, difficulty swallowing Bilateral hearing aids History of deviated septum, status post surgery by Dr. Radene Journey Pulmonary: Negative; No cough, wheezing, shortness of breath, hemoptysis Cardiovascular: Chronic diastolic heart failure GI: Negative; No nausea, vomiting, diarrhea, or abdominal pain GU: Negative; No dysuria, hematuria, or difficulty voiding Musculoskeletal: History of psoriatic arthritis Hematologic/Oncology: Negative; no easy bruising, bleeding Endocrine: Transgender male to male on chronic androgen therapy with  transition beginning around age 49 Neuro: Prior stroke Right L5-S1 stenosis Cervical C4-C5 stenosis. Skin: Negative; No rashes or skin lesions Psychiatric: Negative; No behavioral problems, depression Sleep: Negative; No snoring, daytime sleepiness, hypersomnolence, bruxism, restless legs, hypnogognic hallucinations, no cataplexy Other comprehensive 14 point system review is negative.   PHYSICAL EXAM:   VS:  BP 107/74   Pulse 88   Ht 5' 9"  (1.753 m)   Wt 211 lb 11.2 oz (96 kg)   SpO2 98%   BMI 31.26 kg/m     Repeat blood pressure by me 104/70  Wt Readings from Last 3 Encounters:  07/21/19 211 lb 11.2 oz (96 kg)  07/20/19 210 lb (95.3 kg)  05/23/19 207 lb 9.6 oz (94.2 kg)    General: Alert, oriented, no distress.  Skin: normal turgor, no rashes, warm and dry HEENT: Normocephalic, atraumatic. Pupils equal round and reactive to light; sclera anicteric; extraocular muscles intact;  Nose without nasal septal hypertrophy Mouth/Parynx benign; Mallinpatti scale 3 Neck: No JVD, no carotid bruits; normal carotid upstroke Lungs: clear to ausculatation and percussion; no wheezing or rales Chest wall: without tenderness to palpitation Heart: PMI not displaced, RRR, s1 s2 normal, 1/6 systolic murmur, no diastolic murmur, no rubs, gallops, thrills, or heaves Abdomen: soft, nontender; no hepatosplenomehaly, BS+; abdominal aorta nontender and not dilated by palpation. Back: no CVA tenderness Pulses 2+ Musculoskeletal: full range of motion, normal strength, no joint deformities Extremities: no clubbing cyanosis or edema, Homan's sign negative  Neurologic: grossly nonfocal; Cranial nerves grossly wnl Psychologic: Normal mood and affect   Studies/Labs Reviewed:   EKG:  EKG is not ordered today.  I personally reviewed the ECG from 06/25/2019 which shows normal sinus rhythm at 79 bpm with rightward axis.  Recent Labs: BMP Latest Ref Rng & Units 02/23/2019 08/25/2018 08/24/2018  Glucose 70 -  99 mg/dL 104(H) 138(H) 178(H)  BUN 6 - 20 mg/dL 14 13 19   Creatinine 0.61 - 1.24 mg/dL 1.09 0.84 0.96  BUN/Creat Ratio 9 - 20 - - -  Sodium 135 - 145 mmol/L 138 133(L) 135  Potassium 3.5 - 5.1 mmol/L 3.9 3.6 3.9  Chloride 98 - 111 mmol/L 102 99 101  CO2 22 - 32 mmol/L 26 23 25   Calcium 8.9 - 10.3 mg/dL 8.8(L) 8.1(L) 8.3(L)     Hepatic Function Latest Ref Rng & Units 02/23/2019 08/18/2018 06/10/2018  Total Protein 6.5 - 8.1 g/dL 7.0 7.1 6.6  Albumin 3.5 - 5.0 g/dL  4.2 4.4 4.0  AST 15 - 41 U/L 23 28 21   ALT 0 - 44 U/L 22 22 20   Alk Phosphatase 38 - 126 U/L 227(H) 169(H) 191(H)  Total Bilirubin 0.3 - 1.2 mg/dL 0.5 0.4 0.4  Bilirubin, Direct 0.00 - 0.40 mg/dL - - -    CBC Latest Ref Rng & Units 05/23/2019 03/11/2019 02/23/2019  WBC 4.0 - 10.5 K/uL 3.2(L) 4.7 4.4  Hemoglobin 13.0 - 17.0 g/dL 12.2(L) 13.6 14.8  Hematocrit 39.0 - 52.0 % 41.6 44.8 48.5  Platelets 150 - 400 K/uL 215 232 222   Lab Results  Component Value Date   MCV 76.2 (L) 05/23/2019   MCV 78.7 (L) 03/11/2019   MCV 79.9 (L) 02/23/2019   Lab Results  Component Value Date   TSH 1.370 04/29/2018   No results found for: HGBA1C   BNP No results found for: BNP  ProBNP No results found for: PROBNP   Lipid Panel     Component Value Date/Time   CHOL 142 01/05/2018 1509   TRIG 45 01/05/2018 1509   HDL 52 01/05/2018 1509   CHOLHDL 2.7 01/05/2018 1509   LDLCALC 81 01/05/2018 1509   LABVLDL 9 01/05/2018 1509     RADIOLOGY: No results found.   Additional studies/ records that were reviewed today include:  I have reviewed the records of Dr. Recardo Evangelist, [facility] neurology, as well as Dr. Burr Medico of hematology. A download was obtained today of his ASV unit from April 20 through Jul 20, 2019.  ASSESSMENT:    1. Complex sleep apnea syndrome   2. Chronic diastolic heart failure ([place])   3. Long term (current) use of anticoagulants   4. Protein C deficiency (HCC)   5. Palpitations   6. H/O: CVA (cerebrovascular  accident)   7. Degenerative disc disease, cervical   8. Degenerative disc disease, lumbar   9. Psoriatic arthritis [facility])     PLAN:  Mr. Yusuf Yu is a 54 year old transgender male to male who has a history of chronic diastolic heart failure, remote history of stroke with protein C deficiency on Xarelto anticoagulation, as well as a history of significant obstructive sleep apnea originally diagnosed in [place] in 2015.  The patient has a history of deviated septal surgery in 2018.  He had been followed by Jesse Bowers at [facility] neurology and apparently he no longer wishes to be evaluated there.  He states he was discharged from the practice.  Apparently he has an ASV machine due to complex sleep apnea with obstruction as well as central events.  I had a very long discussion with him today regarding the importance of therapy.  He apparently has not been using treatment and over the past 30 days only used therapy on one occasion.  He states his machine has begun to malfunction and as result he has not been compliant.  He has significant excessive daytime sleepiness with an Epworth Sleepiness Scale score calculating today at 22.  I discussed with him the potential adverse consequences of untreated sleep apnea with reference to his cardiovascular health including risk for atrial fibrillation or nocturnal arrhythmias, blood pressure issues, potential for nocturnal hypoxemia contributing to ischemia, as well as its effects on diabetes mellitus, GERD, and inflammation.  He also has significant issues with back discomfort from cervical and lumbar disc disease, celiac disease, as well as psoriatic arthritis.  Typically he goes to bed between 10 and 11 PM but oftentimes may have difficulty falling asleep and oftentimes wakes up  in pain.  He is typically up at 6:30 in the morning.  He has had issues with chronic ear infections and has bilateral hearing aids.  I will try to obtain his records from  [place] regarding his initial sleep evaluation.  With his machine malfunction, I recommended that he obtain a new replacement machine.  He continues to go to [facility] physical medicine and rehabilitation for pain management.  I will see him in 2 months for follow-up evaluation.  Time spent: 40 minutes   Medication Adjustments/Labs and Tests Ordered: Current medicines are reviewed at length with the patient today.  Concerns regarding medicines are outlined above.  Medication changes, Labs and Tests ordered today are listed in the Patient Instructions below. Patient Instructions   Follow-Up: At [facility], you and your health needs are our priority.  As part of our continuing mission to provide you with exceptional heart care, we have created designated Provider Care Teams.  These Care Teams include your primary Cardiologist (physician) and Advanced Practice Providers (APPs -  Physician Assistants and Nurse Practitioners) who all work together to provide you with the care you need, when you need it.  We recommend signing up for the patient portal called "MyChart".  Sign up information is provided on this After Visit Summary.  MyChart is used to connect with patients for Virtual Visits (Telemedicine).  Patients are able to view lab/test results, encounter notes, upcoming appointments, etc.  Non-urgent messages can be sent to your provider as well.   To learn more about what you can do with MyChart, go to [email or web address].    Your next appointment:   2 month(s)  The format for your next appointment:   In Person  Provider:   Shelva Majestic, MD      Signed, Jesse Majestic, MD  08/01/2019 4:31 PM    [facility] [address] [place], [place], [place]  [address] Phone: [phone]

## 2019-08-02 ENCOUNTER — Ambulatory Visit (INDEPENDENT_AMBULATORY_CARE_PROVIDER_SITE_OTHER): Payer: BC Managed Care – PPO | Admitting: Psychology

## 2019-08-02 DIAGNOSIS — F4323 Adjustment disorder with mixed anxiety and depressed mood: Secondary | ICD-10-CM

## 2019-08-04 ENCOUNTER — Ambulatory Visit: Payer: BC Managed Care – PPO | Admitting: Psychology

## 2019-08-05 ENCOUNTER — Ambulatory Visit (INDEPENDENT_AMBULATORY_CARE_PROVIDER_SITE_OTHER): Payer: BC Managed Care – PPO | Admitting: Psychology

## 2019-08-05 DIAGNOSIS — F4323 Adjustment disorder with mixed anxiety and depressed mood: Secondary | ICD-10-CM | POA: Diagnosis not present

## 2019-08-08 ENCOUNTER — Telehealth: Payer: Self-pay | Admitting: Registered Nurse

## 2019-08-08 ENCOUNTER — Encounter (INDEPENDENT_AMBULATORY_CARE_PROVIDER_SITE_OTHER): Payer: BC Managed Care – PPO | Admitting: Ophthalmology

## 2019-08-08 NOTE — Telephone Encounter (Signed)
Placed a call to [name] regarding his My-chart message. He reports he walks in his home by wall walking and uses his walker when outside of the home. For long distances he uses his motorize wheelchair. He also performs chair Yoga.

## 2019-08-09 ENCOUNTER — Ambulatory Visit: Payer: BC Managed Care – PPO | Admitting: Psychology

## 2019-08-09 ENCOUNTER — Telehealth: Payer: Self-pay

## 2019-08-09 NOTE — Telephone Encounter (Signed)
See my chart message from patient regarding "adult" statement in his records for Danella Sensing and Alger Simons MD - patient states he is male and "adult" should be replaced with "male".  I sent screen shot of header showing Male - pronoun he/him/his - he states that is correct gender - but all medical records that identify him as adult and not male - needs immediate correction.  Sending request to IT.

## 2019-08-11 ENCOUNTER — Ambulatory Visit (INDEPENDENT_AMBULATORY_CARE_PROVIDER_SITE_OTHER): Payer: BC Managed Care – PPO | Admitting: Psychology

## 2019-08-11 ENCOUNTER — Ambulatory Visit: Payer: Medicare Other | Admitting: Psychology

## 2019-08-11 DIAGNOSIS — F4323 Adjustment disorder with mixed anxiety and depressed mood: Secondary | ICD-10-CM

## 2019-08-16 ENCOUNTER — Ambulatory Visit (INDEPENDENT_AMBULATORY_CARE_PROVIDER_SITE_OTHER): Payer: BC Managed Care – PPO | Admitting: Psychology

## 2019-08-16 ENCOUNTER — Telehealth: Payer: Self-pay

## 2019-08-16 DIAGNOSIS — F332 Major depressive disorder, recurrent severe without psychotic features: Secondary | ICD-10-CM

## 2019-08-16 NOTE — Telephone Encounter (Signed)
Epic Identity team emailed to advise patients charts will be amended they will handle and advise patient of resolution

## 2019-08-18 ENCOUNTER — Ambulatory Visit (INDEPENDENT_AMBULATORY_CARE_PROVIDER_SITE_OTHER): Payer: BC Managed Care – PPO | Admitting: Psychology

## 2019-08-18 DIAGNOSIS — F4323 Adjustment disorder with mixed anxiety and depressed mood: Secondary | ICD-10-CM

## 2019-08-23 ENCOUNTER — Ambulatory Visit (INDEPENDENT_AMBULATORY_CARE_PROVIDER_SITE_OTHER): Payer: BC Managed Care – PPO | Admitting: Psychology

## 2019-08-23 DIAGNOSIS — F4323 Adjustment disorder with mixed anxiety and depressed mood: Secondary | ICD-10-CM | POA: Diagnosis not present

## 2019-08-25 ENCOUNTER — Ambulatory Visit (INDEPENDENT_AMBULATORY_CARE_PROVIDER_SITE_OTHER): Payer: BC Managed Care – PPO | Admitting: Psychology

## 2019-08-25 DIAGNOSIS — F4323 Adjustment disorder with mixed anxiety and depressed mood: Secondary | ICD-10-CM

## 2019-08-30 ENCOUNTER — Ambulatory Visit (INDEPENDENT_AMBULATORY_CARE_PROVIDER_SITE_OTHER): Payer: BC Managed Care – PPO | Admitting: Psychology

## 2019-08-30 DIAGNOSIS — F4323 Adjustment disorder with mixed anxiety and depressed mood: Secondary | ICD-10-CM | POA: Diagnosis not present

## 2019-09-01 ENCOUNTER — Ambulatory Visit: Payer: BC Managed Care – PPO | Admitting: Psychology

## 2019-09-01 ENCOUNTER — Encounter (INDEPENDENT_AMBULATORY_CARE_PROVIDER_SITE_OTHER): Payer: BC Managed Care – PPO | Admitting: Ophthalmology

## 2019-09-06 ENCOUNTER — Ambulatory Visit (INDEPENDENT_AMBULATORY_CARE_PROVIDER_SITE_OTHER): Payer: BC Managed Care – PPO | Admitting: Psychology

## 2019-09-06 DIAGNOSIS — F4323 Adjustment disorder with mixed anxiety and depressed mood: Secondary | ICD-10-CM | POA: Diagnosis not present

## 2019-09-08 ENCOUNTER — Other Ambulatory Visit: Payer: Self-pay

## 2019-09-08 ENCOUNTER — Ambulatory Visit (INDEPENDENT_AMBULATORY_CARE_PROVIDER_SITE_OTHER): Payer: BC Managed Care – PPO | Admitting: Psychology

## 2019-09-08 ENCOUNTER — Encounter (INDEPENDENT_AMBULATORY_CARE_PROVIDER_SITE_OTHER): Payer: Self-pay | Admitting: Ophthalmology

## 2019-09-08 ENCOUNTER — Ambulatory Visit (INDEPENDENT_AMBULATORY_CARE_PROVIDER_SITE_OTHER): Payer: BC Managed Care – PPO | Admitting: Ophthalmology

## 2019-09-08 DIAGNOSIS — H2513 Age-related nuclear cataract, bilateral: Secondary | ICD-10-CM

## 2019-09-08 DIAGNOSIS — F4323 Adjustment disorder with mixed anxiety and depressed mood: Secondary | ICD-10-CM | POA: Diagnosis not present

## 2019-09-08 DIAGNOSIS — H35411 Lattice degeneration of retina, right eye: Secondary | ICD-10-CM

## 2019-09-08 DIAGNOSIS — H35412 Lattice degeneration of retina, left eye: Secondary | ICD-10-CM

## 2019-09-08 HISTORY — DX: Age-related nuclear cataract, bilateral: H25.13

## 2019-09-08 NOTE — Progress Notes (Addendum)
09/08/2019     CHIEF COMPLAINT Patient presents for Retina Evaluation   HISTORY OF PRESENT ILLNESS: Jesse Bowers is a 54 y.o. adult who presents to the clinic today for:   HPI    Retina Evaluation    In both eyes.  This started 13 months ago.  Associated Symptoms Floaters and Distortion.  Negative for Flashes.  Context:  distance vision and near vision.          Comments    13 month f/u fundus photos and dilated exam.  Pt states he has been having eye pain and a grey spot OD.Pt states he has been having intermittent blurry vision. Pt states he has been having double vision OD<OS on top and side by side.        Last edited by Melburn Popper, COA on 09/08/2019  2:11 PM. (History)      Referring physician: Shawnee Knapp, MD [place],  [place] [id number]  HISTORICAL INFORMATION:   Selected notes from the MEDICAL RECORD NUMBER       CURRENT MEDICATIONS: Current Outpatient Medications (Ophthalmic Drugs)  Medication Sig  . Propylene Glycol (SYSTANE COMPLETE) 0.6 % SOLN Place 1-2 drops into both eyes 2 (two) times daily as needed (dry eyes).    No current facility-administered medications for this visit. (Ophthalmic Drugs)   Current Outpatient Medications (Other)  Medication Sig  . acyclovir ointment (ZOVIRAX) 5 % Apply 1 application topically daily as needed (cold sores).   [name] antiseptic oral rinse (BIOTENE) LIQD 15 mLs by Mouth Rinse route as needed for dry mouth.  . baclofen (LIORESAL) 20 MG tablet Take 1 tablet (20 mg total) by mouth 4 (four) times daily.  . Calcipotriene-Betameth Diprop (ENSTILAR) 0.005-0.064 % FOAM Apply 1 application topically 2 (two) times daily as needed.   . ciprofloxacin-dexamethasone (CIPRODEX) OTIC suspension   . clobetasol (TEMOVATE) 0.05 % GEL Apply 1 application topically 2 (two) times daily as needed (itching).  . clorazepate (TRANXENE) 15 MG tablet Take 15 mg by mouth at bedtime.  . clorazepate (TRANXENE) 7.5 MG tablet Take 7.5  mg by mouth 2 (two) times daily.   . Cyanocobalamin (VITAMIN B 12 PO) Place 5,000 mcg under the tongue daily.  [name] desonide (DESOWEN) 0.05 % ointment Apply 1 application topically 2 (two) times daily as needed (psoriasis).   [name] diclofenac sodium (VOLTAREN) 1 % GEL APPLY TOPICALLY TO BOTH HANDS 3 TIMES DAILY. (Patient taking differently: Apply 1 application topically 3 (three) times daily as needed. )  . Emollient (CERAVE) CREA Apply 1 application topically 2 (two) times daily as needed.   . ergocalciferol (VITAMIN D2) 1.25 MG (50000 UT) capsule ergocalciferol (vitamin D2) 1,250 mcg (50,000 unit) capsule  . Fluocinolone Acetonide 0.01 % OIL Place 3 drops into both ears 2 (two) times daily as needed for itching.  . furosemide (LASIX) 20 MG tablet Take 1 tablet (20 mg total) by mouth daily. To be taken with the 40 mg to equal 60 mg daily  . furosemide (LASIX) 40 MG tablet Take 1 tablet (40 mg total) by mouth daily. To be taken with the 20 mg to equal 60 mg daily.  [name] morphine (KADIAN) 60 MG 24 hr capsule Take 1 capsule (60 mg total) by mouth every 12 (twelve) hours.  [name] NEEDLE, DISP, 18 G 18G X 1-1/2" MISC 1 Units by Does not apply route once a week.  [name] NEEDLE, DISP, 23 G 23G X 3/4" MISC 1 Units by Does  not apply route once a week.  . neomycin-polymyxin-hydrocortisone (CORTISPORIN) 3.5-10000-1 OTIC suspension Place 4 drops into both ears 2 (two) times daily as needed (ear pain).   . Oxycodone HCl 10 MG TABS Take 1 tablet (10 mg total) by mouth every 8 (eight) hours as needed.  . potassium chloride (KLOR-CON) 10 MEQ tablet TAKE K-DUR 2 TABLETS 10 MEQ DAILY  . rivaroxaban (XARELTO) 20 MG TABS tablet Take 1 tablet (20 mg total) by mouth at bedtime. TAKE 1 TABLET DAILY BEFORE BEDTIME  . Secukinumab (COSENTYX SENSOREADY PEN) 150 MG/ML SOAJ Inject 300 mg into the skin every 28 (twenty-eight) days.   [name] testosterone cypionate (DEPOTESTOSTERONE CYPIONATE) 200 MG/ML injection INJECT 0.5 MLS (100 MG TOTAL) INTO THE  MUSCLE ONCE A WEEK.  . ursodiol (ACTIGALL) 250 MG tablet Take 250 mg by mouth 2 (two) times daily with a meal.   . ursodiol (ACTIGALL) 500 MG tablet Take 500 mg by mouth 2 (two) times daily with a meal.   . venlafaxine XR (EFFEXOR-XR) 150 MG 24 hr capsule Take 2 capsules (300 mg total) by mouth daily with breakfast.   No current facility-administered medications for this visit. (Other)      REVIEW OF SYSTEMS:    ALLERGIES Allergies  Allergen Reactions  . Gabapentin Nausea Only and Other (See Comments)    Other reaction(s): nausea, mental status Drowsiness and restlessness. Pt. States, " It makes me crazy, I can't take this medicine."  . Penicillin G Anaphylaxis and Other (See Comments)    Has patient had a PCN reaction causing immediate rash, facial/tongue/throat swelling, SOB or lightheadedness with hypotension: Yes Has patient had a PCN reaction causing severe rash involving mucus membranes or skin necrosis: No Has patient had a PCN reaction that required hospitalization Yes Has patient had a PCN reaction occurring within the last 10 years: No If all of the above answers are "NO", then may proceed with Cephalosporin use.   . Sulfa Antibiotics Rash    Stevens-Johnson rash  . Vancomycin Rash and Other (See Comments)    RED MAN SYNDROME CAN HAVE IF GIVEN OVER 2HOURS  . Duloxetine Other (See Comments)    Restless legs  . Tegaderm Ag Mesh [Silver] Dermatitis    Causes blistering wounds   . Wheat Bran     Due to Celiac  . Ibuprofen Other (See Comments)    Contraindicated with Xarelto.   . Nortriptyline Other (See Comments)    Dry mouth at 25 mg dose.  Tolerates 10 mg dose  . Pregabalin Other (See Comments)    Ineffective  . Sulfacetamide Sodium-Sulfur Rash    PAST MEDICAL HISTORY Past Medical History:  Diagnosis Date  . Abnormal weight loss   . Anxiety   . Arthritis   . Cataract    OU  . Celiac disease   . Cervical neck pain with evidence of disc disease     patient has a cyst   . Chronic constipation   . Chronic diastolic heart failure ([place])    Pt. denies  . Chronic pain   . Degenerative disc disease at L5-S1 level    with stenosis  . DVT (deep venous thrombosis) ([facility])    Right upper arm, bilateral leg  . Eczema    inguinal, feet  . Elevated liver enzymes   . 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 Family history of adverse reaction to anesthesia    family has problems with anesthesia of nausea and vomiting   . Male-to-male  transgender person   . GERD (gastroesophageal reflux disease)    History of in 20's  . Gluten enteropathy   . H/O parotitis    right   . Hard of hearing   . History of kidney stones   . History of retinal tear    Bilateral  . History of staph infection    required wound vac  . Hx-TIA (transient ischemic attack)    2015  . LVH (left ventricular hypertrophy) 12/15/2016   Mild, noted on ECHO  . MVP (mitral valve prolapse)   . NAFL (nonalcoholic fatty liver)   . Neck pain   . Neuromuscular disorder ([place])    bilateral neuropathy feet.  . Pneumonia 12/17/2010  . Polycythemia   . Polycythemia, secondary   . PONV (postoperative nausea and vomiting)   . Protein C deficiency ([place])    Dr. Anne Fu  . Psoriasis    16 X10 cm psoriatic rash on sole of left foot ; open and occ scant bleeding;   . psoriatic arthritis   . PTSD (post-traumatic stress disorder)   . Scaphoid fracture of wrist 09/23/2013  . Seizure ([place])    childhood, medication until age 58 then weanned completely off  . Sleep apnea    split night study last done by Dr. [name] 06/18/15 shows severe OSA, CSA, and hypersomnia, rec bipap  . Splenomegaly   . Stenosis of ureteropelvic junction (UPJ)    left  . 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 [name] follows  . Syrinx of spinal cord ([place]) 01/06/2014   c spine on MRI  . Tachycardia    hx of   . Transfusion history    past history- none  recent, after surgeries due to blood loss  . Wears glasses   . Wears hearing aid    Past Surgical History:  Procedure Laterality Date  . ABDOMINAL HYSTERECTOMY Bilateral 1994   TAH, BSO- tranverse incision at 54 yo  . ANKLE ARTHROSCOPY WITH RECONSTRUCTION Right 2007  . CHOLECYSTECTOMY     laparoscopic  . COLONOSCOPY     x3  . EYE SURGERY     Left eye 03/02/2018, right 02/15/2018  . HIP ARTHROSCOPY W/ LABRAL REPAIR Right 05/11/2013   acetabular labral tear 03/30/2013  . KNEE ARTHROPLASTY Right   . KNEE JOINT MANIPULATION Left    x3 under anesthesia  . KNEE SURGERY Bilateral 1984   Right ACL, left PCL repair  . LITHOTRIPSY  2005  . LIVER BIOPSY  2013   normal results.  [name] MASTECTOMY Bilateral    prior to 2009  . MOUTH SURGERY    . NASAL SEPTUM SURGERY N/A 09/20/2015   by ENT Dr. [name]  . OVARIAN CYST SURGERY Left    size of grapefruit, was informed that she had shortened vagina  . SHOULDER SURGERY Bilateral    Right 08/15/2016, Left 11/15/2016  . THUMB ARTHROSCOPY Left   . THYROIDECTOMY, PARTIAL Left 2008  . TOTAL KNEE ARTHROPLASTY Right 08/23/2018   Procedure: TOTAL KNEE ARTHROPLASTY;  Surgeon: [name], MD;  Location: [facility];  Service: Orthopedics;  Laterality: Right;  12mn  . TOTAL KNEE REVISION Left 02/06/2016   Procedure: LEFT TOTAL KNEE REVISION;  Surgeon: FGaynelle Arabian MD;  Location: [facility];  Service: Orthopedics;  Laterality: Left;  . TOTAL KNEE REVISION Left 04/22/2017   Procedure: Left knee polyethylene revision;  Surgeon: AGaynelle Arabian MD;  Location: [facility];  Service: Orthopedics;  Laterality: Left;  . UPPER GI ENDOSCOPY  2003    FAMILY HISTORY Family History  Problem Relation Age of Onset  . Stroke Maternal Grandfather        15  . Heart attack Maternal Grandfather   . Glaucoma Maternal Grandfather   . Macular degeneration Maternal Grandfather   . Breast cancer Sister   . Hypertension Mother   . Psoriasis Mother   . Other Mother        meningioma  developed ~2019  . Glaucoma Mother   . Cancer Paternal Grandfather   . Heart attack Paternal Grandfather   . Stroke Paternal Uncle        age 39  . Polycythemia Paternal Uncle   . Stroke Maternal Grandmother   . Congestive Heart Failure Maternal Grandmother   . Heart attack Maternal Grandmother   . Protein C deficiency Sister 71       Miscarriages  . Breast cancer Maternal Aunt 103    SOCIAL HISTORY Social History   Tobacco Use  . Smoking status: Never Smoker  . Smokeless tobacco: Never Used  Vaping Use  . Vaping Use: Never used  Substance Use Topics  . Alcohol use: Yes    Comment: social  . Drug use: No         OPHTHALMIC EXAM:  Base Eye Exam    Visual Acuity (ETDRS)      Right Left   Dist [place] 20/30+2 20/40   Dist ph [place]  20/30-1       Tonometry (Tonopen, 2:14 PM)      Right Left   Pressure 12 13       Pupils      Pupils Dark Light Shape React APD   Right PERRL 3 2 Round Brisk None   Left PERRL 3 2 Round Brisk None       Visual Fields (Counting fingers)      Left Right    Full Full       Extraocular Movement      Right Left    Ductions normal Full    0 0 0  0  0  0 0 0   -- -- --  --  --  -- -- --    On right eye abduction, some external rotation does occur       Neuro/Psych    Oriented x3: Yes   Mood/Affect: Normal       Dilation    Both eyes: 2.5% Phenylephrine, 1.0% Mydriacyl @ 2:15 PM        Slit Lamp and Fundus Exam    External Exam      Right Left   External Normal Normal       Slit Lamp Exam      Right Left   Lids/Lashes Normal Normal   Conjunctiva/Sclera White and quiet White and quiet   Cornea Clear Clear   Anterior Chamber Deep and quiet Deep and quiet   Iris Round and reactive Round and reactive   Lens 2+ Nuclear sclerosis 2+ Nuclear sclerosis   Anterior Vitreous Normal Normal       Fundus Exam      Right Left   Posterior Vitreous Normal Normal   Disc Normal Normal   C/D Ratio 0.2 0.2   Macula Normal  Normal   Vessels Normal Normal   Periphery Lattice generation superonasal, inferonasal and inferotemporal each with good laser photocoagulation reaction. Patient inferotemporal no treatment needed at 530, pexy around lattice degeneration  superotemporal.          IMAGING AND PROCEDURES  Imaging and Procedures for 09/08/19  Color Fundus Photography Optos - OU - Both Eyes       Right Eye Progression has been stable. Disc findings include normal observations. Macula : normal observations. Vessels : normal observations. Periphery : lattice.   Left Eye Progression has been stable. Disc findings include normal observations. Macula : normal observations. Vessels : normal observations. Periphery : lattice.   Notes Bilateral lattice degeneration, good retinopexy, no new retinal holes or tears, will observe.                ASSESSMENT/PLAN:  Nuclear sclerotic cataract of both eyes The nature of cataract was discussed with the patient as well as the elective nature of surgery. The patient was reassured that surgery at a later date does not put the patient at risk for a worse outcome. It was emphasized that the need for surgery is dictated by the patient's quality of life as influenced by the cataract. Patient was instructed to maintain close follow up with their general eye care doctor.  Patient is currently having night vision troubles and I do recommend full comprehensive  ophthalmic evaluation.  Lattice degeneration of peripheral retina, left OU with lattice degeneration, no new retinal holes or tears.  Stable      ICD-10-CM   1. Lattice degeneration of peripheral retina, left  H35.412 Color Fundus Photography Optos - OU - Both Eyes  2. Lattice degeneration, right eye  H35.411 Color Fundus Photography Optos - OU - Both Eyes  3. Nuclear sclerotic cataract of both eyes  H25.13     1.  OU, lattice degeneration, no new retinal holes or tears.  2.  Bilateral nuclear sclerotic  cataract with night vision difficulties and also transient blurred vision.  Patient this this is most likely due to some of the cholinergic effect of medications that he requires for his overall physical health.  Active the symptoms are transient suggest this is not retinal in origin but in fact could be refractive in nature.  For this reason a comprehensive ophthalmic examination and potentially addressing the direct issue in each eye with multifocal intraocular lens surgical procedure.  3.  Will proceed with referral to [facility] for comprehensive examination  Ophthalmic Meds Ordered this visit:  No orders of the defined types were placed in this encounter.      Return in about 1 year (around 09/07/2020), or ,,,, for DILATE OU, COLOR FP.  There are no Patient Instructions on file for this visit.   Explained the diagnoses, plan, and follow up with the patient and they expressed understanding.  Patient expressed understanding of the importance of proper follow up care.   Clent Demark Aala Ransom M.D. Diseases & Surgery of the Retina and Vitreous Retina & Diabetic [place] 09/08/19     Abbreviations: M myopia (nearsighted); A astigmatism; H hyperopia (farsighted); P presbyopia; Mrx spectacle prescription;  CTL contact lenses; OD right eye; OS left eye; OU both eyes  XT exotropia; ET esotropia; PEK punctate epithelial keratitis; PEE punctate epithelial erosions; DES dry eye syndrome; MGD meibomian gland dysfunction; ATs artificial tears; PFAT's preservative free artificial tears; [place] nuclear sclerotic cataract; PSC posterior subcapsular cataract; ERM epi-retinal membrane; PVD posterior vitreous detachment; RD retinal detachment; DM diabetes mellitus; DR diabetic retinopathy; NPDR non-proliferative diabetic retinopathy; PDR proliferative diabetic retinopathy; CSME clinically significant macular edema; DME diabetic macular edema; dbh dot blot hemorrhages; CWS cotton wool  spot; POAG primary open  angle glaucoma; C/D cup-to-disc ratio; HVF humphrey visual field; GVF goldmann visual field; OCT optical coherence tomography; IOP intraocular pressure; BRVO Branch retinal vein occlusion; CRVO central retinal vein occlusion; CRAO central retinal artery occlusion; BRAO branch retinal artery occlusion; RT retinal tear; SB scleral buckle; PPV pars plana vitrectomy; VH Vitreous hemorrhage; PRP panretinal laser photocoagulation; IVK intravitreal kenalog; VMT vitreomacular traction; MH Macular hole;  NVD neovascularization of the disc; NVE neovascularization elsewhere; AREDS age related eye disease study; ARMD age related macular degeneration; POAG primary open angle glaucoma; EBMD epithelial/anterior basement membrane dystrophy; ACIOL anterior chamber intraocular lens; IOL intraocular lens; PCIOL posterior chamber intraocular lens; Phaco/IOL phacoemulsification with intraocular lens placement; [place] photorefractive keratectomy; LASIK laser assisted in situ keratomileusis; HTN hypertension; DM diabetes mellitus; COPD chronic obstructive pulmonary disease

## 2019-09-08 NOTE — Assessment & Plan Note (Signed)
The nature of cataract was discussed with the patient as well as the elective nature of surgery. The patient was reassured that surgery at a later date does not put the patient at risk for a worse outcome. It was emphasized that the need for surgery is dictated by the patient's quality of life as influenced by the cataract. Patient was instructed to maintain close follow up with their general eye care doctor.  Patient is currently having night vision troubles and I do recommend full comprehensive  ophthalmic evaluation.

## 2019-09-08 NOTE — Assessment & Plan Note (Signed)
OU with lattice degeneration, no new retinal holes or tears.  Stable

## 2019-09-12 ENCOUNTER — Telehealth: Payer: Self-pay | Admitting: *Deleted

## 2019-09-12 NOTE — Telephone Encounter (Signed)
I spoke to patient about his machine and he states he got new insurance in September and he is waiting to hear back from his insurance so he can choose an in network provider. Pt is agreeable to treatment  to results.

## 2019-09-13 ENCOUNTER — Ambulatory Visit: Payer: Medicare Other | Admitting: Psychology

## 2019-09-14 ENCOUNTER — Ambulatory Visit (INDEPENDENT_AMBULATORY_CARE_PROVIDER_SITE_OTHER): Payer: BC Managed Care – PPO | Admitting: Psychology

## 2019-09-14 DIAGNOSIS — F4312 Post-traumatic stress disorder, chronic: Secondary | ICD-10-CM

## 2019-09-15 ENCOUNTER — Ambulatory Visit (INDEPENDENT_AMBULATORY_CARE_PROVIDER_SITE_OTHER): Payer: BC Managed Care – PPO | Admitting: Psychology

## 2019-09-15 DIAGNOSIS — F4323 Adjustment disorder with mixed anxiety and depressed mood: Secondary | ICD-10-CM

## 2019-09-20 ENCOUNTER — Ambulatory Visit (INDEPENDENT_AMBULATORY_CARE_PROVIDER_SITE_OTHER): Payer: BC Managed Care – PPO | Admitting: Psychology

## 2019-09-20 DIAGNOSIS — F4323 Adjustment disorder with mixed anxiety and depressed mood: Secondary | ICD-10-CM | POA: Diagnosis not present

## 2019-09-21 ENCOUNTER — Other Ambulatory Visit: Payer: Self-pay

## 2019-09-21 ENCOUNTER — Telehealth: Payer: Self-pay | Admitting: *Deleted

## 2019-09-21 ENCOUNTER — Encounter: Payer: Self-pay | Admitting: Registered Nurse

## 2019-09-21 ENCOUNTER — Encounter: Payer: BC Managed Care – PPO | Attending: Physical Medicine & Rehabilitation | Admitting: Registered Nurse

## 2019-09-21 VITALS — BP 114/80 | HR 111 | Temp 98.3°F | Ht 69.0 in | Wt 214.3 lb

## 2019-09-21 DIAGNOSIS — M7522 Bicipital tendinitis, left shoulder: Secondary | ICD-10-CM | POA: Diagnosis not present

## 2019-09-21 DIAGNOSIS — I69351 Hemiplegia and hemiparesis following cerebral infarction affecting right dominant side: Secondary | ICD-10-CM | POA: Insufficient documentation

## 2019-09-21 DIAGNOSIS — Z5181 Encounter for therapeutic drug level monitoring: Secondary | ICD-10-CM | POA: Diagnosis present

## 2019-09-21 DIAGNOSIS — T84018S Broken internal joint prosthesis, other site, sequela: Secondary | ICD-10-CM | POA: Diagnosis not present

## 2019-09-21 DIAGNOSIS — M5416 Radiculopathy, lumbar region: Secondary | ICD-10-CM | POA: Diagnosis not present

## 2019-09-21 DIAGNOSIS — G609 Hereditary and idiopathic neuropathy, unspecified: Secondary | ICD-10-CM

## 2019-09-21 DIAGNOSIS — Z7901 Long term (current) use of anticoagulants: Secondary | ICD-10-CM | POA: Diagnosis not present

## 2019-09-21 DIAGNOSIS — R252 Cramp and spasm: Secondary | ICD-10-CM | POA: Diagnosis present

## 2019-09-21 DIAGNOSIS — K900 Celiac disease: Secondary | ICD-10-CM | POA: Insufficient documentation

## 2019-09-21 DIAGNOSIS — Z79891 Long term (current) use of opiate analgesic: Secondary | ICD-10-CM | POA: Diagnosis present

## 2019-09-21 DIAGNOSIS — I69320 Aphasia following cerebral infarction: Secondary | ICD-10-CM | POA: Insufficient documentation

## 2019-09-21 DIAGNOSIS — Z9181 History of falling: Secondary | ICD-10-CM | POA: Insufficient documentation

## 2019-09-21 DIAGNOSIS — L4050 Arthropathic psoriasis, unspecified: Secondary | ICD-10-CM | POA: Insufficient documentation

## 2019-09-21 DIAGNOSIS — Z96659 Presence of unspecified artificial knee joint: Secondary | ICD-10-CM

## 2019-09-21 DIAGNOSIS — M545 Low back pain: Secondary | ICD-10-CM | POA: Insufficient documentation

## 2019-09-21 DIAGNOSIS — Z86718 Personal history of other venous thrombosis and embolism: Secondary | ICD-10-CM | POA: Diagnosis not present

## 2019-09-21 DIAGNOSIS — M13862 Other specified arthritis, left knee: Secondary | ICD-10-CM | POA: Diagnosis not present

## 2019-09-21 DIAGNOSIS — Z79899 Other long term (current) drug therapy: Secondary | ICD-10-CM | POA: Diagnosis not present

## 2019-09-21 DIAGNOSIS — R209 Unspecified disturbances of skin sensation: Secondary | ICD-10-CM | POA: Diagnosis not present

## 2019-09-21 DIAGNOSIS — F418 Other specified anxiety disorders: Secondary | ICD-10-CM | POA: Insufficient documentation

## 2019-09-21 DIAGNOSIS — M7061 Trochanteric bursitis, right hip: Secondary | ICD-10-CM

## 2019-09-21 DIAGNOSIS — D751 Secondary polycythemia: Secondary | ICD-10-CM | POA: Insufficient documentation

## 2019-09-21 DIAGNOSIS — D6859 Other primary thrombophilia: Secondary | ICD-10-CM | POA: Diagnosis not present

## 2019-09-21 DIAGNOSIS — G8929 Other chronic pain: Secondary | ICD-10-CM | POA: Diagnosis present

## 2019-09-21 DIAGNOSIS — G894 Chronic pain syndrome: Secondary | ICD-10-CM | POA: Diagnosis present

## 2019-09-21 DIAGNOSIS — I69398 Other sequelae of cerebral infarction: Secondary | ICD-10-CM | POA: Insufficient documentation

## 2019-09-21 DIAGNOSIS — Z87890 Personal history of sex reassignment: Secondary | ICD-10-CM | POA: Diagnosis not present

## 2019-09-21 DIAGNOSIS — S43002A Unspecified subluxation of left shoulder joint, initial encounter: Secondary | ICD-10-CM | POA: Diagnosis not present

## 2019-09-21 DIAGNOSIS — M7062 Trochanteric bursitis, left hip: Secondary | ICD-10-CM

## 2019-09-21 DIAGNOSIS — Z96651 Presence of right artificial knee joint: Secondary | ICD-10-CM

## 2019-09-21 DIAGNOSIS — Z96652 Presence of left artificial knee joint: Secondary | ICD-10-CM

## 2019-09-21 MED ORDER — MORPHINE SULFATE ER 60 MG PO CP24: 60.0000 mg | ORAL_CAPSULE | Freq: Two times a day (BID) | ORAL | 0 refills | Status: DC

## 2019-09-21 MED ORDER — OXYCODONE HCL 10 MG PO TABS: 10.0000 mg | ORAL_TABLET | Freq: Three times a day (TID) | ORAL | 0 refills | Status: DC | PRN

## 2019-09-21 NOTE — Progress Notes (Signed)
Subjective:    Patient ID: Jesse Bowers, adult    DOB: [birth year], 54 y.o.   MRN: [id number]  HPI: Jesse Bowers is a 54 y.o. choose not to disclose who returns for follow up appointment for chronic pain and medication refill. He states his pain is located in his neck radiating into his left arm with tingling, burning and numbness and occasionally radiating into his right arm with tingling and burning. Also reports lower back pain radiating into his bilateral lower extremities L>R, bilateral hip pain and bilateral knee pain R>L. He rates his pain 7. His current exercise regime is chair yoga three days a week and HEP 1- 2 days a week performing stretching exercises.  [name] reports he was having diplopia and occasionally blurry vision, he was seen by his Retinal Specialist he states. He was noted to have a cataract and was seen by opthalmology and schedule for cataract surgery in September he states.  [name] Morphine equivalent is 165.54 MME.    Last Oral Swab was Performed on 05/18/2019, it was consistent.    Pain Inventory Average Pain 8 Pain Right Now 7 My pain is constant, sharp, stabbing, tingling and aching  In the last 24 hours, has pain interfered with the following? General activity 9 Relation with others 9 Enjoyment of life 9 What TIME of day is your pain at its worst? daytime, night Sleep (in general) Poor  Pain is worse with: walking, sitting, standing and some activites Pain improves with: rest, medication and injections Relief from Meds: 5  Mobility walk with assistance use a walker how many minutes can you walk? 5 do you drive?  yes use a wheelchair transfers alone  Function disabled: date disabled . I need assistance with the following:  dressing, bathing, meal prep, household duties and shopping Do you have any goals in this area?  yes  Neuro/Psych weakness numbness tremor tingling trouble walking spasms dizziness anxiety  Prior  Studies Any changes since last visit?  no  Physicians involved in your care Any changes since last visit?  no   Family History  Problem Relation Age of Onset  . Stroke Maternal Grandfather        92  . Heart attack Maternal Grandfather   . Glaucoma Maternal Grandfather   . Macular degeneration Maternal Grandfather   . Breast cancer Sister   . Hypertension Mother   . Psoriasis Mother   . Other Mother        meningioma developed ~2019  . Glaucoma Mother   . Cancer Paternal Grandfather   . Heart attack Paternal Grandfather   . Stroke Paternal Uncle        age 33  . Polycythemia Paternal Uncle   . Stroke Maternal Grandmother   . Congestive Heart Failure Maternal Grandmother   . Heart attack Maternal Grandmother   . Protein C deficiency Sister 34       Miscarriages  . Breast cancer Maternal Aunt 42   Social History   Socioeconomic History  . Marital status: Married    Spouse name: Not on file  . Number of children: 2  . Years of education: 4y college  . Highest education level: Not on file  Occupational History  . Occupation: Pediatric Nurse practitioner    Comment: Not working since [place] 2015  Tobacco Use  . Smoking status: Never Smoker  . Smokeless tobacco: Never Used  Vaping Use  . Vaping Use: Never used  Substance and Sexual Activity  .  Alcohol use: Yes    Comment: social  . Drug use: No  . Sexual activity: Yes    Birth control/protection: None    Comment: patient is a transgender on testosterone shots, no biological kids  Other Topics Concern  . Not on file  Social History Narrative   Education 4 year college, former Therapist, sports X 15 years, pediatric nurse practitioner x 6 years, did NP degree from [employer] [place]. Relocated to [place] about 2 months ago from [place], [place]. Patient was in [place] for last 4 years and prior to that in [place]. His wife is working as Scientist, research (physical sciences) for [employer]. Patient is not working and applying for disability. They have 2  kids but no biologic children.    Social Determinants of Health   Financial Resource Strain:   . Difficulty of Paying Living Expenses:   Food Insecurity:   . Worried About Charity fundraiser in the Last Year:   . Arboriculturist in the Last Year:   Transportation Needs:   . Film/video editor (Medical):   Jesse Bowers Lack of Transportation (Non-Medical):   Physical Activity:   . Days of Exercise per Week:   . Minutes of Exercise per Session:   Stress:   . Feeling of Stress :   Social Connections:   . Frequency of Communication with Friends and Family:   . Frequency of Social Gatherings with Friends and Family:   . Attends Religious Services:   . Active Member of Clubs or Organizations:   . Attends Archivist Meetings:   Jesse Bowers Marital Status:    Past Surgical History:  Procedure Laterality Date  . ABDOMINAL HYSTERECTOMY Bilateral 1994   TAH, BSO- tranverse incision at 54 yo  . ANKLE ARTHROSCOPY WITH RECONSTRUCTION Right 2007  . CHOLECYSTECTOMY     laparoscopic  . COLONOSCOPY     x3  . EYE SURGERY     Left eye 03/02/2018, right 02/15/2018  . HIP ARTHROSCOPY W/ LABRAL REPAIR Right 05/11/2013   acetabular labral tear 03/30/2013  . KNEE ARTHROPLASTY Right   . KNEE JOINT MANIPULATION Left    x3 under anesthesia  . KNEE SURGERY Bilateral 1984   Right ACL, left PCL repair  . LITHOTRIPSY  2005  . LIVER BIOPSY  2013   normal results.  Jesse Bowers MASTECTOMY Bilateral    prior to 2009  . MOUTH SURGERY    . NASAL SEPTUM SURGERY N/A 09/20/2015   by ENT Dr. [name]  . OVARIAN CYST SURGERY Left    size of grapefruit, was informed that she had shortened vagina  . SHOULDER SURGERY Bilateral    Right 08/15/2016, Left 11/15/2016  . THUMB ARTHROSCOPY Left   . THYROIDECTOMY, PARTIAL Left 2008  . TOTAL KNEE ARTHROPLASTY Right 08/23/2018   Procedure: TOTAL KNEE ARTHROPLASTY;  Surgeon: [name], MD;  Location: [facility];  Service: Orthopedics;  Laterality: Right;  82mn  . TOTAL KNEE REVISION  Left 02/06/2016   Procedure: LEFT TOTAL KNEE REVISION;  Surgeon: FGaynelle Arabian MD;  Location: [facility];  Service: Orthopedics;  Laterality: Left;  . TOTAL KNEE REVISION Left 04/22/2017   Procedure: Left knee polyethylene revision;  Surgeon: AGaynelle Arabian MD;  Location: [facility];  Service: Orthopedics;  Laterality: Left;  . UPPER GI ENDOSCOPY  2003   Past Medical History:  Diagnosis Date  . Abnormal weight loss   . Anxiety   . Arthritis   . Cataract    OU  . Celiac disease   .  Cervical neck pain with evidence of disc disease    patient has a cyst   . Chronic constipation   . Chronic diastolic heart failure ([facility])    Pt. denies  . Chronic pain   . Degenerative disc disease at L5-S1 level    with stenosis  . DVT (deep venous thrombosis) ([facility])    Right upper arm, bilateral leg  . Eczema    inguinal, feet  . Elevated liver enzymes   . 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 Family history of adverse reaction to anesthesia    family has problems with anesthesia of nausea and vomiting   . 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 GERD (gastroesophageal reflux disease)    History of in 20's  . Gluten enteropathy   . H/O parotitis    right   . Hard of hearing   . History of kidney stones   . History of retinal tear    Bilateral  . History of staph infection    required wound vac  . Hx-TIA (transient ischemic attack)    2015  . LVH (left ventricular hypertrophy) 12/15/2016   Mild, noted on ECHO  . MVP (mitral valve prolapse)   . NAFL (nonalcoholic fatty liver)   . Neck pain   . Neuromuscular disorder ([facility])    bilateral neuropathy feet.  . Pneumonia 12/17/2010  . Polycythemia   . Polycythemia, secondary   . PONV (postoperative nausea and vomiting)   . Protein C deficiency ([facility])    Dr. Anne Fu  . Psoriasis    16 X10 cm psoriatic rash on sole of left foot ; open and occ scant bleeding;   . psoriatic arthritis   . PTSD (post-traumatic stress disorder)   .  Scaphoid fracture of wrist 09/23/2013  . Seizure ([facility])    childhood, medication until age 32 then weanned completely off  . Sleep apnea    split night study last done by Dr. [name] 06/18/15 shows severe OSA, CSA, and hypersomnia, rec bipap  . Splenomegaly   . Stenosis of ureteropelvic junction (UPJ)    left  . Stroke [facility])    CVA vs TIA in left cerebrum causing slight right sided weakness-Dr. [name] follows  . Syrinx of spinal cord ([facility]) 01/06/2014   c spine on MRI  . Tachycardia    hx of   . Transfusion history    past history- none recent, after surgeries due to blood loss  . Wears glasses   . Wears hearing aid    BP 114/80   Pulse (!) 111   Temp 98.3 F (36.8 C)   Ht 5' 9"  (1.753 m)   Wt 214 lb 4.8 oz (97.2 kg)   SpO2 96%   BMI 31.65 kg/m   Opioid Risk Score:   Fall Risk Score:  `1  Depression screen PHQ 2/9  No flowsheet data found.  Review of Systems  Constitutional: Negative.   HENT: Negative.   Eyes: Negative.   Respiratory: Negative.   Cardiovascular: Negative.   Gastrointestinal: Negative.   Endocrine: Negative.   Genitourinary: Negative.   Musculoskeletal: Positive for arthralgias, back pain, gait problem, myalgias, neck pain and neck stiffness.       Spasms  Skin: Negative.   Allergic/Immunologic: Negative.   Neurological: Positive for dizziness, tremors, weakness and numbness.       Tingling   Hematological: Negative.   Psychiatric/Behavioral: The patient is nervous/anxious.   All other systems reviewed and are negative.  Objective:   Physical Exam Vitals and nursing note reviewed.  Constitutional:      Appearance: Normal appearance.  Neck:     Comments: Cervical Paraspinal Tenderness: C-5-C-6  Cardiovascular:     Rate and Rhythm: Normal rate and regular rhythm.     Pulses: Normal pulses.     Heart sounds: Normal heart sounds.  Pulmonary:     Effort: Pulmonary effort is normal.     Breath sounds: Normal breath sounds.   Musculoskeletal:     Cervical back: Normal range of motion and neck supple.     Comments: Normal Muscle Bulk and Muscle Testing Reveals:  Upper Extremities: Full ROM and Muscle Strength 5/5 Bilateral AC Joint Tenderness  Thoracic Hypersensitivity: T-1-4 Lumbar Hypersensitivity Bilateral Greater Trochanter Tenderness Lower Extremities: Full ROM and Muscle Strength 5/5 Right Lower Extremity Flexion Produces Pain into the Right Patella Laterally  Left Lower Extremity Flexion Produces Pain into Patella Medially and Left Hip Arrived in wheelchair  Skin:    General: Skin is warm and dry.  Neurological:     Mental Status: He is alert and oriented to person, place, and time.  Psychiatric:        Mood and Affect: Mood normal.        Behavior: Behavior normal.           Assessment & Plan:  1. Psoriatic arthritis with pain in multiple areas, most prominently feet, hands, elbows. Refilled:Kadian 60 mg 24 hr. Capsule, one capsule every 12 hours #60, and Continue with the slow weaning of Oxycodone. Refilled:  Oxycodone 10 mg one tablet every8hours as needed #80.Second scriptssent for the following month.09/21/2019. We will continue the opioid monitoring program, this consists of regular clinic visits, examinations, urine drug screen, pill counts as well as use of [place] Controlled Substance Reporting System.  Rheumatology Following. 2. Chronic Pain Syndrome: Continue Compound Cream.09/21/2019. 3. 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 S/P TKR on 07/03/14: Ortho Following: Dr. Wynelle Link perform Total Left Knee Revision on 02/06/2016. S/PLeft Knee Revision on 04/22/17.S/PRight TKA on 08/23/2018 with Dr. Wynelle Link. 5. Chronic mid- low back  pain: Continue current medication regime, and encourage to increase activity as tolerated.09/21/2019. 6. Polycythemia: Oncology Following.09/21/2019 7. Depression with anxiety :Psychiatry following.Continue to Monitor.09/21/2019 8. Cervicalgia:Continue HEP as tolerated and Continue to Monitor. Continue current medication regime.09/21/2019 9. Muscle Spasticity: ContinueBaclofen07/21/2021 10.RightAnkle Pain:No complaints Today.Continue to Monitor.09/21/2019. 10. Left lateral epicondylitis:No complaints today.Ortho Following.09/21/2019 11.BilateralShoulder Pain:Continue HEP as Tolerated, Continue to Monitor.Ortho Following.09/21/2019. 12. Lumbar Radiculitis:S/PLeft L5-S1 TranslaminarInjectionon 07/09/2018. We will continue to Monitor. 09/21/2019. 13.BilateralGreater Trochanteric Bursitis: Continue with Ice and Heat Therapy: Contiue HEP as Tolerated. Continue to Monitor.09/21/2019.  75mnutes of face to face patient care time was spent during this visit. All questions were encouraged and answered.  F/U in 2 months

## 2019-09-21 NOTE — Telephone Encounter (Signed)
Left message call was in reference to him having problems with his sleep machine. I will call him back again later today to see if I can assist him.

## 2019-09-22 ENCOUNTER — Telehealth: Payer: Self-pay | Admitting: *Deleted

## 2019-09-22 ENCOUNTER — Ambulatory Visit (INDEPENDENT_AMBULATORY_CARE_PROVIDER_SITE_OTHER): Payer: BC Managed Care – PPO | Admitting: Psychology

## 2019-09-22 DIAGNOSIS — F4323 Adjustment disorder with mixed anxiety and depressed mood: Secondary | ICD-10-CM

## 2019-09-22 NOTE — Telephone Encounter (Signed)
Spoke with patient in reference to his non functioning CPAP machine. He does not know who is now MDE provider is since changing from [place]. I told him before I can order him a new machine I will need for Dr Claiborne Billings to amend his note stating the machine is non functioning. I will also need the previous records from his initial OSA diagnosis, since we did not order his original equipment. Informrd the patient that I will work on the documentation that I need. He will need to find out what MDE company that is in his network, so that I can send in a order for a new machine. We are essentially starting over. Informed him once I get all of the needed documentation I will call him to provide me with the MDE company I need to send the order to. Patient agrees with this plan.

## 2019-09-23 ENCOUNTER — Telehealth: Payer: Self-pay | Admitting: *Deleted

## 2019-09-23 NOTE — Telephone Encounter (Signed)
Received a return call from [facility] . She informs me that the patient has Medicare and [employer] which they are in Network with. The patient called into their office back in May, but she did not say why. She will call the patient to let him know that they are in network. She will reach out to the RT department to have them contact the patient to take in his machine so they can look at it and give him a loaner machine if they need to send his off for repair.

## 2019-09-26 ENCOUNTER — Telehealth: Payer: Self-pay | Admitting: *Deleted

## 2019-09-26 NOTE — Telephone Encounter (Signed)
Called patient to follow up on recent phone call. Asked him if [employer] has reached out to him. He states they did reach out to give him a time to have his machine fixed. He proceeds to tell them they are not in network with his [place]. He has to use [employer]. I told him I can send the request to [facility] , however I will need the notes. He states that he thinks he knows where they are and he will email them to me. E-mail address provided to the patient to send me the notes. He was also made aware that if he does not provide the needed information he will need to have another sleep study.patient voiced understanding.

## 2019-09-27 ENCOUNTER — Ambulatory Visit: Payer: Medicare Other | Admitting: Psychology

## 2019-09-28 ENCOUNTER — Telehealth: Payer: Self-pay | Admitting: Hematology

## 2019-09-28 ENCOUNTER — Other Ambulatory Visit: Payer: Self-pay

## 2019-09-28 ENCOUNTER — Inpatient Hospital Stay: Payer: BC Managed Care – PPO

## 2019-09-28 ENCOUNTER — Inpatient Hospital Stay: Payer: BC Managed Care – PPO | Attending: Hematology

## 2019-09-28 DIAGNOSIS — D751 Secondary polycythemia: Secondary | ICD-10-CM

## 2019-09-28 DIAGNOSIS — Z8673 Personal history of transient ischemic attack (TIA), and cerebral infarction without residual deficits: Secondary | ICD-10-CM | POA: Diagnosis not present

## 2019-09-28 DIAGNOSIS — D45 Polycythemia vera: Secondary | ICD-10-CM | POA: Insufficient documentation

## 2019-09-28 DIAGNOSIS — Z7901 Long term (current) use of anticoagulants: Secondary | ICD-10-CM | POA: Insufficient documentation

## 2019-09-28 DIAGNOSIS — Z86718 Personal history of other venous thrombosis and embolism: Secondary | ICD-10-CM | POA: Insufficient documentation

## 2019-09-28 LAB — COMPREHENSIVE METABOLIC PANEL
ALT: 21 U/L (ref 0–44)
AST: 20 U/L (ref 15–41)
Albumin: 4.3 g/dL (ref 3.5–5.0)
Alkaline Phosphatase: 193 U/L — ABNORMAL HIGH (ref 38–126)
Anion gap: 9 (ref 5–15)
BUN: 13 mg/dL (ref 6–20)
CO2: 28 mmol/L (ref 22–32)
Calcium: 9.7 mg/dL (ref 8.9–10.3)
Chloride: 101 mmol/L (ref 98–111)
Creatinine, Ser: 1.29 mg/dL — ABNORMAL HIGH (ref 0.61–1.24)
GFR calc Af Amer: >60 mL/min (ref >60)
GFR calc non Af Amer: >60 mL/min (ref >60)
Glucose, Bld: 101 mg/dL — ABNORMAL HIGH (ref 70–99)
Potassium: 4.3 mmol/L (ref 3.5–5.1)
Sodium: 138 mmol/L (ref 135–145)
Total Bilirubin: 0.6 mg/dL (ref 0.3–1.2)
Total Protein: 6.9 g/dL (ref 6.5–8.1)

## 2019-09-28 LAB — CBC WITH DIFFERENTIAL/PLATELET
Abs Immature Granulocytes: 0.01 10*3/uL (ref 0.00–0.07)
Basophils Absolute: 0 10*3/uL (ref 0.0–0.1)
Basophils Relative: 0 %
Eosinophils Absolute: 0 10*3/uL (ref 0.0–0.5)
Eosinophils Relative: 0 %
HCT: 42.3 % (ref 39.0–52.0)
Hemoglobin: 12.8 g/dL — ABNORMAL LOW (ref 13.0–17.0)
Immature Granulocytes: 0 %
Lymphocytes Relative: 23 %
Lymphs Abs: 0.9 10*3/uL (ref 0.7–4.0)
MCH: 23.4 pg — ABNORMAL LOW (ref 26.0–34.0)
MCHC: 30.3 g/dL (ref 30.0–36.0)
MCV: 77.5 fL — ABNORMAL LOW (ref 80.0–100.0)
Monocytes Absolute: 0.4 10*3/uL (ref 0.1–1.0)
Monocytes Relative: 10 %
Neutro Abs: 2.7 10*3/uL (ref 1.7–7.7)
Neutrophils Relative %: 67 %
Platelets: 203 10*3/uL (ref 150–400)
RBC: 5.46 MIL/uL (ref 4.22–5.81)
RDW: 19.9 % — ABNORMAL HIGH (ref 11.5–15.5)
WBC: 4 10*3/uL (ref 4.0–10.5)
nRBC: 0 % (ref 0.0–0.2)

## 2019-09-28 NOTE — Telephone Encounter (Signed)
Rescheduled appointments per 7/27 provider message. Patient is aware of updated appointments date and times.

## 2019-09-28 NOTE — Progress Notes (Signed)
Hct 42.3 today. Phlebotomy not needed.

## 2019-09-29 ENCOUNTER — Ambulatory Visit: Payer: BC Managed Care – PPO | Admitting: Psychology

## 2019-09-30 ENCOUNTER — Ambulatory Visit: Payer: BC Managed Care – PPO | Admitting: Cardiovascular Disease

## 2019-09-30 ENCOUNTER — Encounter: Payer: Self-pay | Admitting: Hematology

## 2019-10-04 ENCOUNTER — Ambulatory Visit: Payer: Medicare Other | Admitting: Psychology

## 2019-10-06 ENCOUNTER — Ambulatory Visit: Payer: BC Managed Care – PPO | Admitting: Psychology

## 2019-10-10 ENCOUNTER — Ambulatory Visit: Payer: BC Managed Care – PPO | Admitting: Cardiovascular Disease

## 2019-10-11 ENCOUNTER — Ambulatory Visit: Payer: Medicare Other | Admitting: Psychology

## 2019-10-11 ENCOUNTER — Ambulatory Visit (INDEPENDENT_AMBULATORY_CARE_PROVIDER_SITE_OTHER): Payer: BC Managed Care – PPO | Admitting: Psychology

## 2019-10-11 DIAGNOSIS — F4323 Adjustment disorder with mixed anxiety and depressed mood: Secondary | ICD-10-CM | POA: Diagnosis not present

## 2019-10-13 ENCOUNTER — Ambulatory Visit (INDEPENDENT_AMBULATORY_CARE_PROVIDER_SITE_OTHER): Payer: BC Managed Care – PPO | Admitting: Psychology

## 2019-10-13 DIAGNOSIS — F4323 Adjustment disorder with mixed anxiety and depressed mood: Secondary | ICD-10-CM | POA: Diagnosis not present

## 2019-10-18 ENCOUNTER — Ambulatory Visit: Payer: Medicare Other | Admitting: Psychology

## 2019-10-20 ENCOUNTER — Ambulatory Visit: Payer: BC Managed Care – PPO | Admitting: Psychology

## 2019-10-25 ENCOUNTER — Ambulatory Visit: Payer: Medicare Other | Admitting: Psychology

## 2019-10-27 ENCOUNTER — Ambulatory Visit (INDEPENDENT_AMBULATORY_CARE_PROVIDER_SITE_OTHER): Payer: BC Managed Care – PPO | Admitting: Psychology

## 2019-10-27 DIAGNOSIS — F4323 Adjustment disorder with mixed anxiety and depressed mood: Secondary | ICD-10-CM

## 2019-10-31 ENCOUNTER — Ambulatory Visit (INDEPENDENT_AMBULATORY_CARE_PROVIDER_SITE_OTHER): Payer: BC Managed Care – PPO | Admitting: Otolaryngology

## 2019-10-31 ENCOUNTER — Other Ambulatory Visit: Payer: Self-pay

## 2019-10-31 ENCOUNTER — Ambulatory Visit (INDEPENDENT_AMBULATORY_CARE_PROVIDER_SITE_OTHER): Payer: BC Managed Care – PPO | Admitting: Psychology

## 2019-10-31 VITALS — Temp 97.2°F

## 2019-10-31 DIAGNOSIS — H60313 Diffuse otitis externa, bilateral: Secondary | ICD-10-CM | POA: Diagnosis not present

## 2019-10-31 DIAGNOSIS — F4323 Adjustment disorder with mixed anxiety and depressed mood: Secondary | ICD-10-CM

## 2019-10-31 DIAGNOSIS — H6983 Other specified disorders of Eustachian tube, bilateral: Secondary | ICD-10-CM

## 2019-10-31 NOTE — Progress Notes (Signed)
HPI: Jesse Bowers is a 54 y.o. adult who returns today for evaluation of ear canals.  Patient wears hearing aids in both ears.  He has had history of chronic external otitis.  On recent visit he was noted to have some mold in his ear.  He has had some slight discomfort in his ears and does not feel like he can pop his ear is open.[name] He does have history of psoriasis  Past Medical History:  Diagnosis Date  . Abnormal weight loss   . Anxiety   . Arthritis   . Cataract    OU  . Celiac disease   . Cervical neck pain with evidence of disc disease    patient has a cyst   . Chronic constipation   . Chronic diastolic heart failure ([place])    Pt. denies  . Chronic pain   . Degenerative disc disease at L5-S1 level    with stenosis  . DVT (deep venous thrombosis) (HCC)    Right upper arm, bilateral leg  . Eczema    inguinal, feet  . Elevated liver enzymes   . 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 Family history of adverse reaction to anesthesia    family has problems with anesthesia of nausea and vomiting   . 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 GERD (gastroesophageal reflux disease)    History of in 20's  . Gluten enteropathy   . H/O parotitis    right   . Hard of hearing   . History of kidney stones   . History of retinal tear    Bilateral  . History of staph infection    required wound vac  . Hx-TIA (transient ischemic attack)    2015  . LVH (left ventricular hypertrophy) 12/15/2016   Mild, noted on ECHO  . MVP (mitral valve prolapse)   . NAFL (nonalcoholic fatty liver)   . Neck pain   . Neuromuscular disorder ([place])    bilateral neuropathy feet.  . Pneumonia 12/17/2010  . Polycythemia   . Polycythemia, secondary   . PONV (postoperative nausea and vomiting)   . Protein C deficiency ([place])    Dr. Anne Fu  . Psoriasis    16 X10 cm psoriatic rash on sole of left foot ; open and occ scant bleeding;   . psoriatic arthritis   . PTSD (post-traumatic stress  disorder)   . Scaphoid fracture of wrist 09/23/2013  . Seizure ([place])    childhood, medication until age 23 then weanned completely off  . Sleep apnea    split night study last done by Dr. [name] 06/18/15 shows severe OSA, CSA, and hypersomnia, rec bipap  . Splenomegaly   . Stenosis of ureteropelvic junction (UPJ)    left  . 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 [name] follows  . 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 Tachycardia    hx of   . Transfusion history    past history- none recent, after surgeries due to blood loss  . Wears glasses   . Wears hearing aid    Past Surgical History:  Procedure Laterality Date  . ABDOMINAL HYSTERECTOMY Bilateral 1994   TAH, BSO- tranverse incision at 54 yo  . ANKLE ARTHROSCOPY WITH RECONSTRUCTION Right 2007  . CHOLECYSTECTOMY     laparoscopic  . COLONOSCOPY     x3  . EYE SURGERY     Left  eye 03/02/2018, right 02/15/2018  . HIP ARTHROSCOPY W/ LABRAL REPAIR Right 05/11/2013   acetabular labral tear 03/30/2013  . KNEE ARTHROPLASTY Right   . KNEE JOINT MANIPULATION Left    x3 under anesthesia  . KNEE SURGERY Bilateral 1984   Right ACL, left PCL repair  . LITHOTRIPSY  2005  . LIVER BIOPSY  2013   normal results.  [name] MASTECTOMY Bilateral    prior to 2009  . MOUTH SURGERY    . NASAL SEPTUM SURGERY N/A 09/20/2015   by ENT Dr. [name]  . OVARIAN CYST SURGERY Left    size of grapefruit, was informed that she had shortened vagina  . SHOULDER SURGERY Bilateral    Right 08/15/2016, Left 11/15/2016  . THUMB ARTHROSCOPY Left   . THYROIDECTOMY, PARTIAL Left 2008  . TOTAL KNEE ARTHROPLASTY Right 08/23/2018   Procedure: TOTAL KNEE ARTHROPLASTY;  Surgeon: [name], MD;  Location: [facility];  Service: Orthopedics;  Laterality: Right;  66mn  . TOTAL KNEE REVISION Left 02/06/2016   Procedure: LEFT TOTAL KNEE REVISION;  Surgeon: FGaynelle Arabian MD;  Location: [facility];  Service: Orthopedics;   Laterality: Left;  . TOTAL KNEE REVISION Left 04/22/2017   Procedure: Left knee polyethylene revision;  Surgeon: AGaynelle Arabian MD;  Location: [facility];  Service: Orthopedics;  Laterality: Left;  . UPPER GI ENDOSCOPY  2003   Social History   Socioeconomic History  . Marital status: Married    Spouse name: Not on file  . Number of children: 2  . Years of education: 4y college  . Highest education level: Not on file  Occupational History  . Occupation: Pediatric Nurse practitioner    Comment: Not working since [employer]2015  Tobacco Use  . Smoking status: Never Smoker  . Smokeless tobacco: Never Used  Vaping Use  . Vaping Use: Never used  Substance and Sexual Activity  . Alcohol use: Yes    Comment: social  . Drug use: No  . Sexual activity: Yes    Birth control/protection: None    Comment: patient is a transgender on testosterone shots, no biological kids  Other Topics Concern  . 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 [place] was in [place] 4 years [place] in [place] His wife is working as HScientist, research (physical sciences)for [employer] Patient is not working and applying for disability. They have 2 kids but no biologic children.    Social Determinants of Health   Financial Resource Strain:   . Difficulty of Paying Living Expenses: Not on file  Food Insecurity:   . Worried About RCharity fundraiserin the Last Year: Not on file  . Ran Out of Food in the Last Year: Not on file  Transportation Needs:   . Lack of Transportation (Medical): Not on file  . Lack of Transportation (Non-Medical): Not on file  Physical Activity:   . Days of Exercise per Week: Not on file  . Minutes of Exercise per Session: Not on file  Stress:   . Feeling of Stress : Not on file  Social Connections:   .  Frequency of Communication with Friends and Family: Not on file  . Frequency of Social Gatherings with Friends and Family: Not on file  . Attends Religious Services: Not on file  .  Active Member of Clubs or Organizations: Not on file  . Attends Archivist Meetings: Not on file  . Marital Status: Not on file   Family History  Problem Relation Age of Onset  . Stroke Maternal Grandfather        18  . Heart attack Maternal Grandfather   . Glaucoma Maternal Grandfather   . Macular degeneration Maternal Grandfather   . Breast cancer Sister   . Hypertension Mother   . Psoriasis Mother   . Other Mother        meningioma developed ~2019  . Glaucoma Mother   . Cancer Paternal Grandfather   . Heart attack Paternal Grandfather   . Stroke Paternal Uncle        age 38  . Polycythemia Paternal Uncle   . Stroke Maternal Grandmother   . Congestive Heart Failure Maternal Grandmother   . Heart attack Maternal Grandmother   . Protein C deficiency Sister 52       Miscarriages  . Breast cancer Maternal Aunt 35   Allergies  Allergen Reactions  . Gabapentin Nausea Only and Other (See Comments)    Other reaction(s): nausea, mental status Drowsiness and restlessness. Pt. States, " It makes me crazy, I can't take this medicine."  . Penicillin G Anaphylaxis and Other (See Comments)    Has patient had a PCN reaction causing immediate rash, facial/tongue/throat swelling, SOB or lightheadedness with hypotension: Yes Has patient had a PCN reaction causing severe rash involving mucus membranes or skin necrosis: No Has patient had a PCN reaction that required hospitalization Yes Has patient had a PCN reaction occurring within the last 10 years: No If all of the above answers are "NO", then may proceed with Cephalosporin use.   . Sulfa Antibiotics Rash    Stevens-Johnson rash  . Vancomycin Rash and Other (See Comments)    RED MAN SYNDROME CAN HAVE IF GIVEN OVER 2HOURS  . Duloxetine Other  (See Comments)    Restless legs  . Tegaderm Ag Mesh [Silver] Dermatitis    Causes blistering wounds   . Wheat Bran     Due to Celiac  . Ibuprofen Other (See Comments)    Contraindicated with Xarelto.   . Nortriptyline Other (See Comments)    Dry mouth at 25 mg dose.  Tolerates 10 mg dose  . Pregabalin Other (See Comments)    Ineffective  . Sulfacetamide Sodium-Sulfur Rash   Prior to Admission medications   Medication Sig Start Date End Date Taking? Authorizing Provider  acyclovir ointment (ZOVIRAX) 5 % Apply 1 application topically daily as needed (cold sores).  01/08/18  Yes [provider]  antiseptic oral rinse (BIOTENE) LIQD 15 mLs by Mouth Rinse route as needed for dry mouth.   Yes [provider]  baclofen (LIORESAL) 20 MG tablet Take 1 tablet (20 mg total) by mouth 4 (four) times daily. 07/20/19  Yes Bayard Hugger, NP  Calcipotriene-Betameth Diprop (ENSTILAR) 0.005-0.064 % FOAM Apply 1 application topically 2 (two) times daily as needed.    Yes [provider]  ciprofloxacin-dexamethasone (CIPRODEX) OTIC suspension  06/20/19  Yes [provider]  clobetasol (TEMOVATE) 0.05 % GEL Apply 1 application topically 2 (two) times daily as needed (itching).   Yes [provider]  clorazepate (TRANXENE) 15 MG tablet Take 15 mg by mouth at bedtime.   Yes [provider]  clorazepate (TRANXENE) 7.5 MG tablet Take 7.5 mg by mouth 2 (two) times daily.    Yes [provider]  desonide (DESOWEN) 0.05 % ointment Apply 1 application topically 2 (two) times daily as needed (psoriasis).  08/04/15  Yes [provider]  diclofenac sodium (VOLTAREN) 1 % GEL APPLY TOPICALLY TO BOTH HANDS 3 TIMES DAILY. Patient taking differently: Apply 1 application topically 3 (three) times daily as needed.  09/02/18  Yes Bayard Hugger, NP  Emollient (CERAVE) CREA Apply 1 application topically 2 (two) times daily as needed.    Yes [provider]  Fluocinolone Acetonide 0.01 % OIL Place 3 drops into both ears 2 (two) times daily as needed for itching. 06/09/18  Yes [provider]  furosemide (LASIX) 40 MG tablet Take 1 tablet (40 mg total) by mouth daily. To be taken with the 20 mg to equal 60 mg daily. 05/23/19  Yes Croitoru, Mihai, MD  morphine (KADIAN) 60 MG 24 hr capsule Take 1 capsule (60 mg total) by mouth every 12 (twelve) hours. 09/21/19  Yes Bayard Hugger, NP  NEEDLE, DISP, 18 G 18G X 1-1/2" MISC 1 Units by Does not apply route once a week. 12/03/17  Yes Shawnee Knapp, MD  NEEDLE, DISP, 23 G 23G X 3/4" MISC 1 Units by Does not apply route once a week. 12/03/17  Yes Shawnee Knapp, MD  neomycin-polymyxin-hydrocortisone (CORTISPORIN) 3.5-10000-1 OTIC suspension Place 4 drops into both ears 2 (two) times daily as needed (ear pain).  03/31/17  Yes [provider]  Oxycodone HCl 10 MG TABS Take 1 tablet (10 mg total) by mouth every 8 (eight) hours as needed. 09/21/19  Yes Bayard Hugger, NP  potassium chloride (KLOR-CON) 10 MEQ tablet TAKE K-DUR 2 TABLETS 10 MEQ DAILY 01/07/19  Yes Barrett, Evelene Croon, PA-C  Propylene Glycol (SYSTANE COMPLETE) 0.6 % SOLN Place 1-2 drops into both eyes 2 (two) times daily as needed (dry eyes).    Yes [provider]  rivaroxaban (XARELTO) 20 MG TABS tablet Take 1 tablet (20 mg total) by mouth at bedtime. TAKE 1 TABLET DAILY BEFORE BEDTIME 11/24/18  Yes [name], MD  Secukinumab (COSENTYX SENSOREADY PEN) 150 MG/ML SOAJ Inject 300 mg into the skin every 28 (twenty-eight) days.    Yes [provider]  testosterone cypionate (DEPOTESTOSTERONE CYPIONATE) 200 MG/ML injection INJECT 0.5 MLS (100 MG TOTAL) INTO THE MUSCLE ONCE A WEEK. 03/15/18  Yes Shawnee Knapp, MD  ursodiol (ACTIGALL) 250 MG tablet Take 250 mg by mouth 2 (two) times daily with a meal.    Yes [provider]  ursodiol (ACTIGALL) 500 MG tablet Take 500 mg by mouth 2 (two) times daily with a meal.    Yes  [provider]  venlafaxine XR (EFFEXOR-XR) 150 MG 24 hr capsule Take 2 capsules (300 mg total) by mouth daily with breakfast. 10/14/17  Yes Meredith Staggers, MD  Cyanocobalamin (VITAMIN B 12 PO) Place 5,000 mcg under the tongue daily.    [provider]  ergocalciferol (VITAMIN D2) 1.25 MG (50000 UT) capsule ergocalciferol (vitamin D2) 1,250 mcg (50,000 unit) capsule    [provider]  furosemide (LASIX) 20 MG tablet Take 1 tablet (20 mg total) by mouth daily. To be taken with the 40 mg to equal 60 mg daily 05/23/19 08/21/19  Croitoru, Dani Gobble, MD     Positive ROS: Otherwise negative  All other systems have been reviewed and were otherwise negative with the exception of those mentioned in the HPI and as above.  Physical Exam: Constitutional: Alert, well-appearing, no acute distress  Ears: External ears without lesions or tenderness.  He is not wearing his hearing aids today.  On the left side he has some debris adjacent to little left TM that was cleaned with hydroperoxide and suction.  He had a few hairs down the ear canal along with some white debris that was cleaned with suction.  The TM was clear with no middle ear effusion noted and good mobility on pneumatic otoscopy.  Right ear canal had minimal debris that was cleaned with suction. the TM itself was clear.  He did have some crusting scabbing and psoriasis or eczema of the lateral portion of the ear canal on the right side.  I applied gentian violet Ciprodex and CSF powder to the left ear canal into the outer portion of the right ear canal. Nasal: External nose without lesions. Septum midline. Clear nasal passages bilaterally. Oral: Lips and gums without lesions. Tongue and palate mucosa without lesions. Posterior oropharynx clear. Neck: No palpable adenopathy or masses Respiratory: Breathing comfortably  Skin: No facial/neck lesions or rash noted.  Cerumen impaction removal  Date/Time: 10/31/2019 3:23  PM Performed by: Rozetta Nunnery, MD Authorized by: Rozetta Nunnery, MD   Consent:    Consent obtained:  Verbal   Consent given by:  Patient   Risks discussed:  Pain and bleeding Procedure details:    Location:  L ear   Procedure type: suction   Post-procedure details:    Inspection:  TM intact and canal normal   Hearing quality:  Improved   Patient tolerance of procedure:  Tolerated well, no immediate complications Comments:     Clean the left ear canal with a suction and applied gentian violet and CSF powder to the left ear canal.  He has some scabbing crusting or eczema on the lateral right ear canal that applied gentian violet to.  The TM was clear on the right side.    Assessment: Minimal external otitis in the office today.  Left side worse than right.  Plan: Ear canals were cleaned in the office today.  TMs were clear bilaterally. Prescribed Flonase to use if he has trouble "popping" his ears. Also discussed with him concerning use of alcohol vinegar ear rinses to help keep the ears dry and fungus free. He will follow-up as needed.   Radene Journey, MD

## 2019-11-01 ENCOUNTER — Ambulatory Visit: Payer: BC Managed Care – PPO | Admitting: Psychology

## 2019-11-03 ENCOUNTER — Ambulatory Visit (INDEPENDENT_AMBULATORY_CARE_PROVIDER_SITE_OTHER): Payer: BC Managed Care – PPO | Admitting: Psychology

## 2019-11-03 DIAGNOSIS — F4323 Adjustment disorder with mixed anxiety and depressed mood: Secondary | ICD-10-CM

## 2019-11-04 ENCOUNTER — Other Ambulatory Visit: Payer: Self-pay | Admitting: Hematology

## 2019-11-04 DIAGNOSIS — D751 Secondary polycythemia: Secondary | ICD-10-CM

## 2019-11-08 ENCOUNTER — Ambulatory Visit (INDEPENDENT_AMBULATORY_CARE_PROVIDER_SITE_OTHER): Payer: BC Managed Care – PPO | Admitting: Psychology

## 2019-11-08 DIAGNOSIS — F4312 Post-traumatic stress disorder, chronic: Secondary | ICD-10-CM

## 2019-11-10 ENCOUNTER — Ambulatory Visit (INDEPENDENT_AMBULATORY_CARE_PROVIDER_SITE_OTHER): Payer: BC Managed Care – PPO | Admitting: Psychology

## 2019-11-10 DIAGNOSIS — F4312 Post-traumatic stress disorder, chronic: Secondary | ICD-10-CM

## 2019-11-15 ENCOUNTER — Ambulatory Visit (INDEPENDENT_AMBULATORY_CARE_PROVIDER_SITE_OTHER): Payer: BC Managed Care – PPO | Admitting: Psychology

## 2019-11-15 DIAGNOSIS — F4312 Post-traumatic stress disorder, chronic: Secondary | ICD-10-CM

## 2019-11-16 ENCOUNTER — Ambulatory Visit (INDEPENDENT_AMBULATORY_CARE_PROVIDER_SITE_OTHER): Payer: BC Managed Care – PPO | Admitting: Psychology

## 2019-11-16 DIAGNOSIS — F4312 Post-traumatic stress disorder, chronic: Secondary | ICD-10-CM | POA: Diagnosis not present

## 2019-11-17 ENCOUNTER — Ambulatory Visit: Payer: BC Managed Care – PPO | Admitting: Psychology

## 2019-11-17 ENCOUNTER — Other Ambulatory Visit: Payer: Self-pay | Admitting: Cardiovascular Disease

## 2019-11-18 ENCOUNTER — Encounter: Payer: Self-pay | Admitting: Registered Nurse

## 2019-11-18 ENCOUNTER — Encounter: Payer: BC Managed Care – PPO | Attending: Physical Medicine & Rehabilitation | Admitting: Registered Nurse

## 2019-11-18 ENCOUNTER — Other Ambulatory Visit: Payer: Self-pay

## 2019-11-18 VITALS — BP 124/77 | HR 91 | Temp 98.7°F | Ht 69.0 in | Wt 214.7 lb

## 2019-11-18 DIAGNOSIS — Z86718 Personal history of other venous thrombosis and embolism: Secondary | ICD-10-CM | POA: Insufficient documentation

## 2019-11-18 DIAGNOSIS — R252 Cramp and spasm: Secondary | ICD-10-CM | POA: Insufficient documentation

## 2019-11-18 DIAGNOSIS — Z5181 Encounter for therapeutic drug level monitoring: Secondary | ICD-10-CM | POA: Diagnosis present

## 2019-11-18 DIAGNOSIS — Z96651 Presence of right artificial knee joint: Secondary | ICD-10-CM

## 2019-11-18 DIAGNOSIS — L4050 Arthropathic psoriasis, unspecified: Secondary | ICD-10-CM

## 2019-11-18 DIAGNOSIS — Z87890 Personal history of sex reassignment: Secondary | ICD-10-CM | POA: Diagnosis not present

## 2019-11-18 DIAGNOSIS — D751 Secondary polycythemia: Secondary | ICD-10-CM | POA: Diagnosis not present

## 2019-11-18 DIAGNOSIS — I69398 Other sequelae of cerebral infarction: Secondary | ICD-10-CM | POA: Diagnosis not present

## 2019-11-18 DIAGNOSIS — Z79899 Other long term (current) drug therapy: Secondary | ICD-10-CM | POA: Insufficient documentation

## 2019-11-18 DIAGNOSIS — M13862 Other specified arthritis, left knee: Secondary | ICD-10-CM | POA: Insufficient documentation

## 2019-11-18 DIAGNOSIS — M7061 Trochanteric bursitis, right hip: Secondary | ICD-10-CM

## 2019-11-18 DIAGNOSIS — M7522 Bicipital tendinitis, left shoulder: Secondary | ICD-10-CM | POA: Diagnosis not present

## 2019-11-18 DIAGNOSIS — M5416 Radiculopathy, lumbar region: Secondary | ICD-10-CM

## 2019-11-18 DIAGNOSIS — M7062 Trochanteric bursitis, left hip: Secondary | ICD-10-CM

## 2019-11-18 DIAGNOSIS — G8929 Other chronic pain: Secondary | ICD-10-CM | POA: Diagnosis present

## 2019-11-18 DIAGNOSIS — M545 Low back pain: Secondary | ICD-10-CM | POA: Insufficient documentation

## 2019-11-18 DIAGNOSIS — S43002A Unspecified subluxation of left shoulder joint, initial encounter: Secondary | ICD-10-CM | POA: Diagnosis not present

## 2019-11-18 DIAGNOSIS — I69320 Aphasia following cerebral infarction: Secondary | ICD-10-CM | POA: Insufficient documentation

## 2019-11-18 DIAGNOSIS — I69351 Hemiplegia and hemiparesis following cerebral infarction affecting right dominant side: Secondary | ICD-10-CM | POA: Diagnosis not present

## 2019-11-18 DIAGNOSIS — R209 Unspecified disturbances of skin sensation: Secondary | ICD-10-CM | POA: Insufficient documentation

## 2019-11-18 DIAGNOSIS — Z79891 Long term (current) use of opiate analgesic: Secondary | ICD-10-CM | POA: Diagnosis present

## 2019-11-18 DIAGNOSIS — Z96652 Presence of left artificial knee joint: Secondary | ICD-10-CM

## 2019-11-18 DIAGNOSIS — F418 Other specified anxiety disorders: Secondary | ICD-10-CM | POA: Diagnosis not present

## 2019-11-18 DIAGNOSIS — Z7901 Long term (current) use of anticoagulants: Secondary | ICD-10-CM | POA: Insufficient documentation

## 2019-11-18 DIAGNOSIS — Z96659 Presence of unspecified artificial knee joint: Secondary | ICD-10-CM

## 2019-11-18 DIAGNOSIS — D6859 Other primary thrombophilia: Secondary | ICD-10-CM | POA: Diagnosis not present

## 2019-11-18 DIAGNOSIS — T84018S Broken internal joint prosthesis, other site, sequela: Secondary | ICD-10-CM

## 2019-11-18 DIAGNOSIS — G609 Hereditary and idiopathic neuropathy, unspecified: Secondary | ICD-10-CM

## 2019-11-18 DIAGNOSIS — G894 Chronic pain syndrome: Secondary | ICD-10-CM

## 2019-11-18 DIAGNOSIS — Z9181 History of falling: Secondary | ICD-10-CM | POA: Diagnosis not present

## 2019-11-18 DIAGNOSIS — K900 Celiac disease: Secondary | ICD-10-CM | POA: Insufficient documentation

## 2019-11-18 MED ORDER — MORPHINE SULFATE ER 60 MG PO CP24: 60.0000 mg | ORAL_CAPSULE | Freq: Two times a day (BID) | ORAL | 0 refills | Status: DC

## 2019-11-18 MED ORDER — MORPHINE SULFATE ER 60 MG PO CP24
60.0000 mg | ORAL_CAPSULE | Freq: Two times a day (BID) | ORAL | 0 refills | Status: DC
Start: 2019-11-18 — End: 2019-11-18

## 2019-11-18 MED ORDER — OXYCODONE HCL 10 MG PO TABS
10.0000 mg | ORAL_TABLET | Freq: Three times a day (TID) | ORAL | 0 refills | Status: DC | PRN
Start: 2019-11-18 — End: 2019-11-18

## 2019-11-18 MED ORDER — OXYCODONE HCL 10 MG PO TABS: 10.0000 mg | ORAL_TABLET | Freq: Three times a day (TID) | ORAL | 0 refills | Status: DC | PRN

## 2019-11-18 NOTE — Progress Notes (Signed)
Subjective:    Patient ID: Jesse Bowers, adult    DOB: 01-19-1966, 54 y.o.   MRN: [id number]  HPI: Jesse Bowers is a 54 y.o. choose not to disclose who returns for follow up appointment for chronic pain and medication refill. He states his pain is located in his neck radiating into his bilateral shoulders, mid- Lower back pain radiating into his buttocks, bilateral hips and bilateral lower extremities. Also reports bilateral knee pain and tingling and burning in his right foot. He rates his pain 8. His current exercise regime is chair yoga two days a week for 20 minutes and Home PT Therapy performing stretching exercises.  Jesse Bowers Morphine equivalent is 160.00MME. He  is also prescribed Clorazepate by Dr. Erling Bowers. We have discussed the black box warning of using opioids and benzodiazepines. I highlighted the dangers of using these drugs together and discussed the adverse events including respiratory suppression, overdose, cognitive impairment and importance of compliance with current regimen. We will continue to monitor and adjust as indicated.  He is being closely monitored and under the care of his psychiatrist Dr Jesse Bowers.     Oral Swab was Performed Today.    Pain Inventory Average Pain 7 Pain Right Now 8 My pain is constant, sharp and tingling  In the last 24 hours, has pain interfered with the following? General activity 9 Relation with others 9 Enjoyment of life 9 What TIME of day is your pain at its worst? daytime and night Sleep (in general) Poor  Pain is worse with: walking, bending, sitting, standing and some activites Pain improves with: heat/ice, therapy/exercise, medication and injections Relief from Meds: 8  Family History  Problem Relation Age of Onset  . Stroke Maternal Grandfather        76  . Heart attack Maternal Grandfather   . Glaucoma Maternal Grandfather   . Macular degeneration Maternal Grandfather   . Breast cancer Sister   . Hypertension Mother   .  Psoriasis Mother   . Other Mother        meningioma developed ~2019  . Glaucoma Mother   . Cancer Paternal Grandfather   . Heart attack Paternal Grandfather   . Stroke Paternal Uncle        age 67  . Polycythemia Paternal Uncle   . Stroke Maternal Grandmother   . Congestive Heart Failure Maternal Grandmother   . Heart attack Maternal Grandmother   . Protein C deficiency Sister 20       Miscarriages  . Breast cancer Maternal Aunt 69   Social History   Socioeconomic History  . Marital status: Married    Spouse name: Not on file  . Number of children: 2  . Years of education: 4y college  . Highest education level: Not on file  Occupational History  . Occupation: Pediatric Nurse practitioner    Comment: Not working since [name] 2015  Tobacco Use  . Smoking status: Never Smoker  . Smokeless tobacco: Never Used  Vaping Use  . Vaping Use: Never used  Substance and Sexual Activity  . Alcohol use: Yes    Comment: social  . Drug use: No  . Sexual activity: Yes    Birth control/protection: None    Comment: patient is a transgender on testosterone shots, no biological kids  Other Topics Concern  . Not on file  Social History Narrative   Education 4 year college, former Therapist, sports X 15 years, pediatric nurse practitioner x 6 years, did NP degree from [employer]  of [place]. Relocated to [place] about 2 months ago from [employer], [place] Patient was in [place] for last 4 years and prior to that in [place]. His wife is working as Scientist, research (physical sciences) for [employer]. Patient is not working and applying for disability. They have 2 kids but no biologic children.    Social Determinants of Health   Financial Resource Strain:   . Difficulty of Paying Living Expenses: Not on file  Food Insecurity:   . Worried About Charity fundraiser in the Last Year: Not on file  . Ran Out of Food in the Last Year: Not on file  Transportation Needs:   . Lack of Transportation (Medical): Not on file  . Lack of  Transportation (Non-Medical): Not on file  Physical Activity:   . Days of Exercise per Week: Not on file  . Minutes of Exercise per Session: Not on file  Stress:   . Feeling of Stress : Not on file  Social Connections:   . Frequency of Communication with Friends and Family: Not on file  . Frequency of Social Gatherings with Friends and Family: Not on file  . Attends Religious Services: Not on file  . Active Member of Clubs or Organizations: Not on file  . Attends Archivist Meetings: Not on file  . Marital Status: Not on file   Past Surgical History:  Procedure Laterality Date  . ABDOMINAL HYSTERECTOMY Bilateral 1994   TAH, BSO- tranverse incision at 54 yo  . ANKLE ARTHROSCOPY WITH RECONSTRUCTION Right 2007  . CHOLECYSTECTOMY     laparoscopic  . COLONOSCOPY     x3  . EYE SURGERY     Left eye 03/02/2018, right 02/15/2018  . HIP ARTHROSCOPY W/ LABRAL REPAIR Right 05/11/2013   acetabular labral tear 03/30/2013  . KNEE ARTHROPLASTY Right   . KNEE JOINT MANIPULATION Left    x3 under anesthesia  . KNEE SURGERY Bilateral 1984   Right ACL, left PCL repair  . LITHOTRIPSY  2005  . LIVER BIOPSY  2013   normal results.  [name] MASTECTOMY Bilateral    prior to 2009  . MOUTH SURGERY    . NASAL SEPTUM SURGERY N/A 09/20/2015   by ENT Dr. [name]  . OVARIAN CYST SURGERY Left    size of grapefruit, was informed that she had shortened vagina  . SHOULDER SURGERY Bilateral    Right 08/15/2016, Left 11/15/2016  . THUMB ARTHROSCOPY Left   . THYROIDECTOMY, PARTIAL Left 2008  . TOTAL KNEE ARTHROPLASTY Right 08/23/2018   Procedure: TOTAL KNEE ARTHROPLASTY;  Surgeon: [name], MD;  Location: [facility];  Service: Orthopedics;  Laterality: Right;  56mn  . TOTAL KNEE REVISION Left 02/06/2016   Procedure: LEFT TOTAL KNEE REVISION;  Surgeon: FGaynelle Arabian MD;  Location: [facility];  Service: Orthopedics;  Laterality: Left;  . TOTAL KNEE REVISION Left 04/22/2017   Procedure: Left knee polyethylene  revision;  Surgeon: AGaynelle Arabian MD;  Location: [facility];  Service: Orthopedics;  Laterality: Left;  . UPPER GI ENDOSCOPY  2003   Past Surgical History:  Procedure Laterality Date  . ABDOMINAL HYSTERECTOMY Bilateral 1994   TAH, BSO- tranverse incision at 54yo  . ANKLE ARTHROSCOPY WITH RECONSTRUCTION Right 2007  . CHOLECYSTECTOMY     laparoscopic  . COLONOSCOPY     x3  . EYE SURGERY     Left eye 03/02/2018, right 02/15/2018  . HIP ARTHROSCOPY W/ LABRAL REPAIR Right 05/11/2013   acetabular labral tear 03/30/2013  . KNEE  ARTHROPLASTY Right   . KNEE JOINT MANIPULATION Left    x3 under anesthesia  . KNEE SURGERY Bilateral 1984   Right ACL, left PCL repair  . LITHOTRIPSY  2005  . LIVER BIOPSY  2013   normal results.  [name] MASTECTOMY Bilateral    prior to 2009  . MOUTH SURGERY    . NASAL SEPTUM SURGERY N/A 09/20/2015   by ENT Dr. [name]  . OVARIAN CYST SURGERY Left    size of grapefruit, was informed that she had shortened vagina  . SHOULDER SURGERY Bilateral    Right 08/15/2016, Left 11/15/2016  . THUMB ARTHROSCOPY Left   . THYROIDECTOMY, PARTIAL Left 2008  . TOTAL KNEE ARTHROPLASTY Right 08/23/2018   Procedure: TOTAL KNEE ARTHROPLASTY;  Surgeon: [name], MD;  Location: [facility];  Service: Orthopedics;  Laterality: Right;  51mn  . TOTAL KNEE REVISION Left 02/06/2016   Procedure: LEFT TOTAL KNEE REVISION;  Surgeon: FGaynelle Arabian MD;  Location: [facility];  Service: Orthopedics;  Laterality: Left;  . TOTAL KNEE REVISION Left 04/22/2017   Procedure: Left knee polyethylene revision;  Surgeon: AGaynelle Arabian MD;  Location: [facility];  Service: Orthopedics;  Laterality: Left;  . UPPER GI ENDOSCOPY  2003   Past Medical History:  Diagnosis Date  . Abnormal weight loss   . Anxiety   . Arthritis   . Cataract    OU  . Celiac disease   . Cervical neck pain with evidence of disc disease    patient has a cyst   . Chronic constipation   . Chronic diastolic heart failure ([facility]    Pt.  denies  . Chronic pain   . Degenerative disc disease at L5-S1 level    with stenosis  . DVT (deep venous thrombosis) ([facility])    Right upper arm, bilateral leg  . Eczema    inguinal, feet  . Elevated liver enzymes   . Failed total knee arthroplasty ([facility] 04/22/2017  . Family history of adverse reaction to anesthesia    family has problems with anesthesia of nausea and vomiting   . 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 GERD (gastroesophageal reflux disease)    History of in 20's  . Gluten enteropathy   . H/O parotitis    right   . Hard of hearing   . History of kidney stones   . History of retinal tear    Bilateral  . History of staph infection    required wound vac  . Hx-TIA (transient ischemic attack)    2015  . LVH (left ventricular hypertrophy) 12/15/2016   Mild, noted on ECHO  . MVP (mitral valve prolapse)   . NAFL (nonalcoholic fatty liver)   . Neck pain   . Neuromuscular disorder ([facility] [place]    bilateral neuropathy feet.  . Pneumonia 12/17/2010  . Polycythemia   . Polycythemia, secondary   . PONV (postoperative nausea and vomiting)   . Protein C deficiency ([facility]    Dr. FAnne Fu . Psoriasis    16 X10 cm psoriatic rash on sole of left foot ; open and occ scant bleeding;   . psoriatic arthritis   . PTSD (post-traumatic stress disorder)   . Scaphoid fracture of wrist 09/23/2013  . Seizure ([facility]    childhood, medication until age [age]then weanned completely off  . Sleep apnea    split night study last done by Dr. SFelecia Shelling4/17/17 shows severe OSA, CSA, and hypersomnia, rec bipap  . Splenomegaly   . Stenosis  of ureteropelvic junction (UPJ)    left  . Stroke [facility])    CVA vs TIA in left cerebrum causing slight right sided weakness-Dr. [name] follows  . Syrinx of spinal cord ([place]) 01/06/2014   c spine on MRI  . Tachycardia    hx of   . Transfusion history    past history- none recent, after surgeries due to blood loss  . Wears glasses   . Wears hearing aid    BP 124/77    Pulse 91   Temp 98.7 F (37.1 C)   Ht 5' 9"  (1.753 m)   Wt 214 lb 11.2 oz (97.4 kg)   SpO2 94%   BMI 31.71 kg/m   Opioid Risk Score:   Fall Risk Score:  `1  Depression screen PHQ 2/9  No flowsheet data found.  Review of Systems  HENT: Negative.   Eyes: Negative.   Respiratory: Negative.   Cardiovascular: Negative.   Gastrointestinal: Negative.   Genitourinary: Negative.   Musculoskeletal: Positive for arthralgias, back pain, gait problem and neck pain.  Skin: Negative.   Allergic/Immunologic: Negative.   Neurological: Positive for weakness and numbness.  Hematological: Negative.   Psychiatric/Behavioral: Negative.   All other systems reviewed and are negative.      Objective:   Physical Exam Vitals and nursing note reviewed.  Constitutional:      Appearance: Normal appearance.  Neck:     Comments: Cervical Paraspinal Tenderness: C-5-C-6 Cardiovascular:     Rate and Rhythm: Normal rate and regular rhythm.     Pulses: Normal pulses.     Heart sounds: Normal heart sounds.  Pulmonary:     Effort: Pulmonary effort is normal.     Breath sounds: Normal breath sounds.  Musculoskeletal:     Cervical back: Normal range of motion and neck supple.     Comments: Normal Muscle Bulk and Muscle Testing Reveals:  Upper Extremities: Full ROM and Muscle Strength 5/5 Bilateral AC Joint tenderness  Thoracic Paraspinal Tenderness: T-1-T-3 T-6-T-7 Lumbar Paraspinal Tenderness: L-3-L-5 Bilateral Greater Trochanter Tenderness Lower Extremities: Decreased ROM and Muscle Strength 5/5 Bilateral Lower Extremities Flexion Produces Pain into his Bilateral Lower Extremities Arrived in wheelchair   Skin:    General: Skin is warm and dry.  Neurological:     Mental Status: He is alert and oriented to person, place, and time.  Psychiatric:        Mood and Affect: Mood normal.        Behavior: Behavior normal.           Assessment & Plan:  1. Psoriatic arthritis with pain in  multiple areas, most prominently feet, hands, elbows. Refilled:Kadian 60 mg 24 hr. Capsule, one capsule every 12 hours #60, and Continue with the slow weaning of Oxycodone. Refilled:  Oxycodone 10 mg one tablet every8hours as needed #70.Second scriptssent for the following month.11/18/2019. We will continue the opioid monitoring program, this consists of regular clinic visits, examinations, urine drug screen, pill counts as well as use of [place] Controlled Substance Reporting system. A 12 month History has been reviewed on the [place] Controlled Substance Reporting System on 11/18/2019. Rheumatology Following. 2. Chronic Pain Syndrome: Continue Compound Cream.11/18/2019. 3. Prior left sided CVA ('s) most substantial of which in May 2015 with residual right sided weakness, sensory loss, and expressive language deficits.: Continue to Monitor.11/18/2019. 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 S/P TKR on 07/03/14: Ortho Following: Dr. Wynelle Link perform Total Left Knee Revision on  02/06/2016. S/PLeft Knee Revision on 04/22/17.S/PRight TKA on 08/23/2018 with Dr. Wynelle Link. 5. Chronic mid- low back pain: Continue current medication regime, and encourage to increase activity as tolerated.11/18/2019. 6. Polycythemia: Oncology Following.11/18/2019 7. Depression with anxiety :Psychiatry following.Continue to Monitor.11/18/2019 8. Cervicalgia:Continue HEP as tolerated and Continue to Monitor. Continue current medication regime.11/18/2019 9. Muscle Spasticity: ContinueBaclofen09/17/2021 10.RightAnkle Pain:No complaints Today.Continue to Monitor.11/18/2019. 10. Left lateral epicondylitis:No complaints today.Ortho Following.11/18/2019 11.BilateralShoulder Pain:Continue HEP as Tolerated, Continue to Monitor.Ortho Following.11/18/2019. 12. Lumbar Radiculitis:S/PLeft L5-S1 TranslaminarInjectionon  07/09/2018. We will continue to Monitor. 11/18/2019. 13.BilateralGreater Trochanteric Bursitis: Continue with Ice and Heat Therapy: Contiue HEP as Tolerated. Continue to Monitor.11/18/2019.  79mnutes of face to face patient care time was spent during this visit. All questions were encouraged and answered.  F/U in 2 months

## 2019-11-22 ENCOUNTER — Ambulatory Visit: Payer: BC Managed Care – PPO | Admitting: Psychology

## 2019-11-23 ENCOUNTER — Ambulatory Visit: Payer: BC Managed Care – PPO | Admitting: Registered Nurse

## 2019-11-24 ENCOUNTER — Ambulatory Visit: Payer: BC Managed Care – PPO | Admitting: Psychology

## 2019-11-25 LAB — DRUG TOX MONITOR 1 W/CONF, ORAL FLD
Alprazolam: NEGATIVE ng/mL (ref <0.50)
Amphetamines: NEGATIVE ng/mL (ref <10)
Barbiturates: NEGATIVE ng/mL (ref <10)
Benzodiazepines: POSITIVE ng/mL — AB (ref <0.50)
Buprenorphine: NEGATIVE ng/mL (ref <0.10)
Chlordiazepoxide: NEGATIVE ng/mL (ref <0.50)
Clonazepam: NEGATIVE ng/mL (ref <0.50)
Cocaine: NEGATIVE ng/mL (ref <5.0)
Codeine: NEGATIVE ng/mL (ref <2.5)
Diazepam: NEGATIVE ng/mL (ref <0.50)
Dihydrocodeine: NEGATIVE ng/mL (ref <2.5)
Fentanyl: NEGATIVE ng/mL (ref <0.10)
Flunitrazepam: NEGATIVE ng/mL (ref <0.50)
Flurazepam: NEGATIVE ng/mL (ref <0.50)
Heroin Metabolite: NEGATIVE ng/mL (ref <1.0)
Hydrocodone: NEGATIVE ng/mL (ref <2.5)
Hydromorphone: NEGATIVE ng/mL (ref <2.5)
Lorazepam: NEGATIVE ng/mL (ref <0.50)
MARIJUANA: NEGATIVE ng/mL (ref <2.5)
MDMA: NEGATIVE ng/mL (ref <10)
Meprobamate: NEGATIVE ng/mL (ref <2.5)
Methadone: NEGATIVE ng/mL (ref <5.0)
Midazolam: NEGATIVE ng/mL (ref <0.50)
Morphine: 34.5 ng/mL — ABNORMAL HIGH (ref <2.5)
Nicotine Metabolite: NEGATIVE ng/mL (ref <5.0)
Nordiazepam: 5.18 ng/mL — ABNORMAL HIGH (ref <0.50)
Norhydrocodone: NEGATIVE ng/mL (ref <2.5)
Noroxycodone: NEGATIVE ng/mL (ref <2.5)
Opiates: POSITIVE ng/mL — AB (ref <2.5)
Oxazepam: NEGATIVE ng/mL (ref <0.50)
Oxycodone: 4 ng/mL — ABNORMAL HIGH (ref <2.5)
Oxymorphone: NEGATIVE ng/mL (ref <2.5)
Phencyclidine: NEGATIVE ng/mL (ref <10)
Tapentadol: NEGATIVE ng/mL (ref <5.0)
Temazepam: NEGATIVE ng/mL (ref <0.50)
Tramadol: NEGATIVE ng/mL (ref <5.0)
Triazolam: NEGATIVE ng/mL (ref <0.50)
Zolpidem: NEGATIVE ng/mL (ref <5.0)

## 2019-11-25 LAB — DRUG TOX ALC METAB W/CON, ORAL FLD: Alcohol Metabolite: NEGATIVE ng/mL (ref <25)

## 2019-11-28 ENCOUNTER — Ambulatory Visit (INDEPENDENT_AMBULATORY_CARE_PROVIDER_SITE_OTHER): Payer: BC Managed Care – PPO | Admitting: Psychology

## 2019-11-28 DIAGNOSIS — F4312 Post-traumatic stress disorder, chronic: Secondary | ICD-10-CM | POA: Diagnosis not present

## 2019-11-29 ENCOUNTER — Ambulatory Visit: Payer: BC Managed Care – PPO | Admitting: Psychology

## 2019-11-29 ENCOUNTER — Telehealth: Payer: Self-pay | Admitting: *Deleted

## 2019-11-29 NOTE — Telephone Encounter (Signed)
Oral swab drug screen was consistent for prescribed medications.  ?

## 2019-11-30 ENCOUNTER — Ambulatory Visit (INDEPENDENT_AMBULATORY_CARE_PROVIDER_SITE_OTHER): Payer: BC Managed Care – PPO | Admitting: Psychology

## 2019-11-30 DIAGNOSIS — F4312 Post-traumatic stress disorder, chronic: Secondary | ICD-10-CM

## 2019-12-01 ENCOUNTER — Ambulatory Visit: Payer: BC Managed Care – PPO | Admitting: Psychology

## 2019-12-05 ENCOUNTER — Ambulatory Visit (INDEPENDENT_AMBULATORY_CARE_PROVIDER_SITE_OTHER): Payer: BC Managed Care – PPO | Admitting: Psychology

## 2019-12-05 DIAGNOSIS — F4312 Post-traumatic stress disorder, chronic: Secondary | ICD-10-CM

## 2019-12-08 ENCOUNTER — Ambulatory Visit: Payer: BC Managed Care – PPO | Admitting: Psychology

## 2019-12-12 ENCOUNTER — Ambulatory Visit (INDEPENDENT_AMBULATORY_CARE_PROVIDER_SITE_OTHER): Payer: BC Managed Care – PPO | Admitting: Psychology

## 2019-12-12 DIAGNOSIS — F4312 Post-traumatic stress disorder, chronic: Secondary | ICD-10-CM

## 2019-12-13 ENCOUNTER — Ambulatory Visit: Payer: BC Managed Care – PPO | Admitting: Psychology

## 2019-12-14 ENCOUNTER — Ambulatory Visit (INDEPENDENT_AMBULATORY_CARE_PROVIDER_SITE_OTHER): Payer: BC Managed Care – PPO | Admitting: Psychology

## 2019-12-14 DIAGNOSIS — F4312 Post-traumatic stress disorder, chronic: Secondary | ICD-10-CM | POA: Diagnosis not present

## 2019-12-14 DIAGNOSIS — F4323 Adjustment disorder with mixed anxiety and depressed mood: Secondary | ICD-10-CM

## 2019-12-15 ENCOUNTER — Ambulatory Visit: Payer: BC Managed Care – PPO | Admitting: Psychology

## 2019-12-15 ENCOUNTER — Other Ambulatory Visit: Payer: Self-pay | Admitting: Physician Assistant

## 2019-12-19 ENCOUNTER — Ambulatory Visit (INDEPENDENT_AMBULATORY_CARE_PROVIDER_SITE_OTHER): Payer: BC Managed Care – PPO | Admitting: Psychology

## 2019-12-19 DIAGNOSIS — F4312 Post-traumatic stress disorder, chronic: Secondary | ICD-10-CM | POA: Diagnosis not present

## 2019-12-20 ENCOUNTER — Ambulatory Visit: Payer: BC Managed Care – PPO | Admitting: Psychology

## 2019-12-21 ENCOUNTER — Ambulatory Visit (INDEPENDENT_AMBULATORY_CARE_PROVIDER_SITE_OTHER): Payer: BC Managed Care – PPO | Admitting: Otolaryngology

## 2019-12-21 ENCOUNTER — Ambulatory Visit: Payer: BC Managed Care – PPO | Admitting: Psychology

## 2019-12-22 ENCOUNTER — Ambulatory Visit: Payer: BC Managed Care – PPO | Admitting: Psychology

## 2019-12-26 ENCOUNTER — Ambulatory Visit: Payer: BC Managed Care – PPO | Admitting: Psychology

## 2019-12-27 ENCOUNTER — Ambulatory Visit: Payer: BC Managed Care – PPO | Admitting: Psychology

## 2019-12-28 ENCOUNTER — Ambulatory Visit (INDEPENDENT_AMBULATORY_CARE_PROVIDER_SITE_OTHER): Payer: BC Managed Care – PPO | Admitting: Psychology

## 2019-12-28 DIAGNOSIS — F4312 Post-traumatic stress disorder, chronic: Secondary | ICD-10-CM | POA: Diagnosis not present

## 2019-12-29 ENCOUNTER — Ambulatory Visit: Payer: BC Managed Care – PPO | Admitting: Psychology

## 2019-12-30 ENCOUNTER — Other Ambulatory Visit: Payer: Self-pay

## 2019-12-30 DIAGNOSIS — N3281 Overactive bladder: Secondary | ICD-10-CM

## 2019-12-31 NOTE — Progress Notes (Signed)
01/02/2020 4:29 PM   Jesse Bowers [birth year] [id number]  Referring provider: Shawnee Knapp, MD [address] [place],  [place] [address]  Chief Complaint  Patient presents with  . Over Active Bladder    HPI: [name] is a 54 year old male with history of stroke, CHF, congenital left UPJ obstruction, OAB, pelvic floor dysfunction and a history of gross hematuria who presents today with right flank pain and decreased UOP.    RUS 03/30/2019 noted moderate left pelvocaliectasis, consistent with history of chronic left UPJ obstruction. Overall, appearance is relatively similar as compared to 2019.  No nephrolithiasis.   Mild splenomegaly, also grossly stable from 2019.  Renal lasix scan from 04/19/2019 normal right kidney with non obstructive hydronephrosis of the left kidney with normal clearance from the mildly dilated left renal pelvis following Lasix administration.    He is also been seen at [facility] by Dr. Dot Lanes.    Over the last several days, he has started to been began to experience right-sided flank pain along with urinary frequency and urgency throughout the day and night.  Although he is urinary frequently, he is taking 80 mg of Lasix and feels that his output does not reflect that.  He is also starting to experience left-sided flank pain at this time.   He states the pain is present all the time.  He is having a stabbing right-sided flank pain and an achy left-sided flank pain.    Patient denies any modifying or aggravating factors.  Patient denies any gross hematuria, dysuria or suprapubic/flank pain.  Patient denies any fevers, chills, nausea or vomiting.   His UA today was negative.  His PVR was 17 mL.    He does have a history of nephrolithiasis.    PMH: Past Medical History:  Diagnosis Date  . Abnormal weight loss   . Anxiety   . Arthritis   . Cataract    OU  . Celiac disease   . Cervical neck pain with evidence of disc disease     patient has a cyst   . Chronic constipation   . Chronic diastolic heart failure ([place])    Pt. denies  . Chronic pain   . Degenerative disc disease at L5-S1 level    with stenosis  . DVT (deep venous thrombosis) (HCC)    Right upper arm, bilateral leg  . Eczema    inguinal, feet  . Elevated liver enzymes   . 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 Family history of adverse reaction to anesthesia    family has problems with anesthesia of nausea and vomiting   . 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 GERD (gastroesophageal reflux disease)    History of in 20's  . Gluten enteropathy   . H/O parotitis    right   . Hard of hearing   . History of kidney stones   . History of retinal tear    Bilateral  . History of staph infection    required wound vac  . Hx-TIA (transient ischemic attack)    2015  . LVH (left ventricular hypertrophy) 12/15/2016   Mild, noted on ECHO  . MVP (mitral valve prolapse)   . NAFL (nonalcoholic fatty liver)   . Neck pain   . Neuromuscular disorder ([place])    bilateral neuropathy feet.  . Pneumonia 12/17/2010  . Polycythemia   . Polycythemia, secondary   . PONV (postoperative nausea and vomiting)   .  Protein C deficiency ([place])    Dr. Anne Fu  . Psoriasis    16 X10 cm psoriatic rash on sole of left foot ; open and occ scant bleeding;   . psoriatic arthritis   . PTSD (post-traumatic stress disorder)   . Scaphoid fracture of wrist 09/23/2013  . Seizure ([place])    childhood, medication until age 43 then weanned completely off  . Sleep apnea    split night study last done by Dr. [name] 06/18/15 shows severe OSA, CSA, and hypersomnia, rec bipap  . Splenomegaly   . Stenosis of ureteropelvic junction (UPJ)    left  . Stroke [facility])    CVA vs TIA in left cerebrum causing slight right sided weakness-Dr. [name] follows  . Syrinx of spinal cord ([place]) 01/06/2014   c spine on MRI  . Tachycardia    hx of   . Transfusion history    past history-  none recent, after surgeries due to blood loss  . Wears glasses   . Wears hearing aid     Surgical History: Past Surgical History:  Procedure Laterality Date  . ABDOMINAL HYSTERECTOMY Bilateral 1994   TAH, BSO- tranverse incision at 54 yo  . ANKLE ARTHROSCOPY WITH RECONSTRUCTION Right 2007  . CHOLECYSTECTOMY     laparoscopic  . COLONOSCOPY     x3  . EYE SURGERY     Left eye 03/02/2018, right 02/15/2018  . HIP ARTHROSCOPY W/ LABRAL REPAIR Right 05/11/2013   acetabular labral tear 03/30/2013  . KNEE ARTHROPLASTY Right   . KNEE JOINT MANIPULATION Left    x3 under anesthesia  . KNEE SURGERY Bilateral 1984   Right ACL, left PCL repair  . LITHOTRIPSY  2005  . LIVER BIOPSY  2013   normal results.  [name] MASTECTOMY Bilateral    prior to 2009  . MOUTH SURGERY    . NASAL SEPTUM SURGERY N/A 09/20/2015   by ENT Dr. [name]  . OVARIAN CYST SURGERY Left    size of grapefruit, was informed that she had shortened vagina  . SHOULDER SURGERY Bilateral    Right 08/15/2016, Left 11/15/2016  . THUMB ARTHROSCOPY Left   . THYROIDECTOMY, PARTIAL Left 2008  . TOTAL KNEE ARTHROPLASTY Right 08/23/2018   Procedure: TOTAL KNEE ARTHROPLASTY;  Surgeon: [name], MD;  Location: [facility];  Service: Orthopedics;  Laterality: Right;  3mn  . TOTAL KNEE REVISION Left 02/06/2016   Procedure: LEFT TOTAL KNEE REVISION;  Surgeon: FGaynelle Arabian MD;  Location: [facility];  Service: Orthopedics;  Laterality: Left;  . TOTAL KNEE REVISION Left 04/22/2017   Procedure: Left knee polyethylene revision;  Surgeon: AGaynelle Arabian MD;  Location: [facility];  Service: Orthopedics;  Laterality: Left;  . UPPER GI ENDOSCOPY  2003    Home Medications:  Allergies as of 01/02/2020      Reactions   Penicillin G Anaphylaxis, Other (See Comments)   Has patient had a PCN reaction causing immediate rash, facial/tongue/throat swelling, SOB or lightheadedness with hypotension: Yes Has patient had a PCN reaction causing severe rash involving  mucus membranes or skin necrosis: No Has patient had a PCN reaction that required hospitalization Yes Has patient had a PCN reaction occurring within the last 10 years: No If all of the above answers are "NO", then may proceed with Cephalosporin use.   Sulfa Antibiotics Rash   Stevens-Johnson rash   Tegaderm Ag Mesh [silver] Dermatitis   Causes blistering wounds   Ibuprofen Other (See Comments)   Contraindicated with Xarelto.  Pregabalin Other (See Comments)   Ineffective   Sulfacetamide Sodium-sulfur Rash      Medication List       Accurate as of January 02, 2020  4:29 PM. If you have any questions, ask your nurse or doctor.        STOP taking these medications   acyclovir ointment 5 % Commonly known as: ZOVIRAX Stopped by: Zara Council, PA-C   antiseptic oral rinse Liqd Stopped by: Kazoua Gossen, PA-C   CeraVe Crea Stopped by: Matilde Pottenger, PA-C   NEEDLE (DISP) 18 G 18G X 1-1/2" Misc Stopped by: Willies Laviolette, PA-C   NEEDLE (DISP) 23 G 23G X 3/4" Misc Stopped by: Savreen Gebhardt, PA-C   Systane Complete 0.6 % Soln Generic drug: Propylene Glycol Stopped by: Allard Lightsey, PA-C     TAKE these medications   baclofen 20 MG tablet Commonly known as: LIORESAL Take 1 tablet (20 mg total) by mouth 4 (four) times daily.   ciprofloxacin-dexamethasone OTIC suspension Commonly known as: CIPRODEX   clobetasol 0.05 % Gel Commonly known as: TEMOVATE Apply 1 application topically 2 (two) times daily as needed (itching).   clorazepate 7.5 MG tablet Commonly known as: TRANXENE Take 7.5 mg by mouth 2 (two) times daily.   clorazepate 15 MG tablet Commonly known as: TRANXENE Take 15 mg by mouth at bedtime.   Cosentyx Sensoready Pen 150 MG/ML [place] Generic drug: Secukinumab Inject 300 mg into the skin every 28 (twenty-eight) days.   desonide 0.05 % ointment Commonly known as: DESOWEN Apply 1 application topically 2 (two) times daily as needed  (psoriasis).   diclofenac sodium 1 % Gel Commonly known as: VOLTAREN APPLY TOPICALLY TO BOTH HANDS 3 TIMES DAILY. What changed: See the new instructions.   Enstilar 0.005-0.064 % Foam Generic drug: Calcipotriene-Betameth Diprop Apply 1 application topically 2 (two) times daily as needed.   Fluocinolone Acetonide 0.01 % Oil Place 3 drops into both ears 2 (two) times daily as needed for itching.   folic acid 1 MG tablet Commonly known as: FOLVITE Take 1 mg by mouth daily.   furosemide 20 MG tablet Commonly known as: LASIX TAKE 1 TABLET BY MOUTH DAILY. TO BE TAKEN WITH THE 40 MG TO EQUAL 60 MG DAILY   furosemide 40 MG tablet Commonly known as: LASIX TAKE 1 TABLET BY MOUTH DAILY. TO BE TAKEN WITH THE 20 MG TO EQUAL 60 MG DAILY.   morphine 60 MG 24 hr capsule Commonly known as: Kadian Take 1 capsule (60 mg total) by mouth every 12 (twelve) hours.   neomycin-polymyxin-hydrocortisone 3.5-10000-1 OTIC suspension Commonly known as: CORTISPORIN Place 4 drops into both ears 2 (two) times daily as needed (ear pain).   Oxycodone HCl 10 MG Tabs Take 1 tablet (10 mg total) by mouth every 8 (eight) hours as needed.   potassium chloride 10 MEQ tablet Commonly known as: KLOR-CON TAKE 2 TABLETS BY MOUTH EVERY DAY   testosterone cypionate 200 MG/ML injection Commonly known as: DEPOTESTOSTERONE CYPIONATE INJECT 0.5 MLS (100 MG TOTAL) INTO THE MUSCLE ONCE A WEEK.   ursodiol 500 MG tablet Commonly known as: ACTIGALL Take 500 mg by mouth 2 (two) times daily with a meal.   ursodiol 250 MG tablet Commonly known as: ACTIGALL Take 250 mg by mouth 2 (two) times daily with a meal.   venlafaxine XR 150 MG 24 hr capsule Commonly known as: EFFEXOR-XR Take 2 capsules (300 mg total) by mouth daily with breakfast.   Xarelto 20 MG Tabs tablet Generic  drug: rivaroxaban TAKE 1 TABLET(20 MG) BY MOUTH EVERY NIGHT AT BEDTIME       Allergies:  Allergies  Allergen Reactions  . Penicillin G  Anaphylaxis and Other (See Comments)    Has patient had a PCN reaction causing immediate rash, facial/tongue/throat swelling, SOB or lightheadedness with hypotension: Yes Has patient had a PCN reaction causing severe rash involving mucus membranes or skin necrosis: No Has patient had a PCN reaction that required hospitalization Yes Has patient had a PCN reaction occurring within the last 10 years: No If all of the above answers are "NO", then may proceed with Cephalosporin use.   . Sulfa Antibiotics Rash    Stevens-Johnson rash  . Tegaderm Ag Mesh [Silver] Dermatitis    Causes blistering wounds   . Ibuprofen Other (See Comments)    Contraindicated with Xarelto.   . Pregabalin Other (See Comments)    Ineffective  . Sulfacetamide Sodium-Sulfur Rash    Family History: Family History  Problem Relation Age of Onset  . Stroke Maternal Grandfather        70  . Heart attack Maternal Grandfather   . Glaucoma Maternal Grandfather   . Macular degeneration Maternal Grandfather   . Breast cancer Sister   . Hypertension Mother   . Psoriasis Mother   . Other Mother        meningioma developed ~2019  . Glaucoma Mother   . Cancer Paternal Grandfather   . Heart attack Paternal Grandfather   . Stroke Paternal Uncle        age 2  . Polycythemia Paternal Uncle   . Stroke Maternal Grandmother   . Congestive Heart Failure Maternal Grandmother   . Heart attack Maternal Grandmother   . Protein C deficiency Sister 52       Miscarriages  . Breast cancer Maternal Aunt 29    Social History:  reports that he has never smoked. He has never used smokeless tobacco. He reports current alcohol use. He reports that he does not use drugs.  ROS: For pertinent review of systems please refer to history of present illness  Physical Exam: BP 134/85   Pulse (!) 106   Ht 5' 9"  (1.753 m)   Wt 222 lb (100.7 kg)   BMI 32.78 kg/m   Constitutional:  Well nourished. Alert and oriented, No acute  distress. HEENT: [place] AT, mask in place.  Trachea midline Cardiovascular: No clubbing, cyanosis, or edema. Respiratory: Normal respiratory effort, no increased work of breathing. GU: Mild bilateral CVA tenderness.  No bladder fullness or masses.   Neurologic: Grossly intact, no focal deficits, moving all 4 extremities.   Psychiatric: Normal mood and affect.  Laboratory Data: Lab Results  Component Value Date   WBC 4.0 09/28/2019   HGB 12.8 (L) 09/28/2019   HCT 42.3 09/28/2019   MCV 77.5 (L) 09/28/2019   PLT 203 09/28/2019    Lab Results  Component Value Date   CREATININE 1.29 (H) 09/28/2019    Lab Results  Component Value Date   TESTOSTERONE 611 06/10/2018    Lab Results  Component Value Date   TSH 1.370 04/29/2018       Component Value Date/Time   CHOL 142 01/05/2018 1509   HDL 52 01/05/2018 1509   CHOLHDL 2.7 01/05/2018 1509   LDLCALC 81 01/05/2018 1509    Lab Results  Component Value Date   AST 20 09/28/2019   Lab Results  Component Value Date   ALT 21 09/28/2019    Urinalysis  Component     Latest Ref Rng & Units 01/02/2020  Color, Urine     YELLOW YELLOW  Appearance     CLEAR CLEAR  Specific Gravity, Urine     1.005 - 1.030 1.020  pH     5.0 - 8.0 6.0  Glucose, UA     NEGATIVE mg/dL NEGATIVE  Hgb urine dipstick     NEGATIVE NEGATIVE  Bilirubin Urine     NEGATIVE NEGATIVE  Ketones, ur     NEGATIVE mg/dL NEGATIVE  Protein     NEGATIVE mg/dL NEGATIVE  Nitrite     NEGATIVE NEGATIVE  Leukocytes,Ua     NEGATIVE NEGATIVE  Squamous Epithelial / LPF     0 - 5 0-5  WBC, UA     0 - 5 WBC/hpf 0-5  RBC / HPF     0 - 5 RBC/hpf NONE SEEN  Bacteria, UA     NONE SEEN NONE SEEN   I have reviewed the labs.   Pertinent Imaging: Results for LENNEX, PIETILA (MRN [id number]) as of 01/04/2020 14:32  Ref. Range 01/02/2020 15:47  Scan Result Unknown 28m    Assessment & Plan:    1. Bilateral flank pain Differentials at this time include  nephrolithiasis and/or worsening of his renal function due to UPJ obstruction Obtain a STAT CT Renal stone study I will call with results   2. Chronic left UPJ obstruction CT pending at this time  Return for pending ct scan results .  These notes generated with voice recognition software. I apologize for typographical errors.  SZara Council PA-C  [facility] [address] [place]  [phone]

## 2020-01-02 ENCOUNTER — Other Ambulatory Visit
Admission: RE | Admit: 2020-01-02 | Discharge: 2020-01-02 | Disposition: A | Discharge status: Home / Self Care | Payer: BC Managed Care – PPO | Attending: Urology | Admitting: Urology

## 2020-01-02 ENCOUNTER — Encounter: Payer: Self-pay | Admitting: Urology

## 2020-01-02 ENCOUNTER — Other Ambulatory Visit: Payer: Self-pay

## 2020-01-02 ENCOUNTER — Ambulatory Visit (INDEPENDENT_AMBULATORY_CARE_PROVIDER_SITE_OTHER): Payer: BC Managed Care – PPO | Admitting: Urology

## 2020-01-02 VITALS — BP 134/85 | HR 106 | Ht 69.0 in | Wt 222.0 lb

## 2020-01-02 DIAGNOSIS — N3281 Overactive bladder: Secondary | ICD-10-CM

## 2020-01-02 DIAGNOSIS — Q6239 Other obstructive defects of renal pelvis and ureter: Secondary | ICD-10-CM | POA: Diagnosis not present

## 2020-01-02 DIAGNOSIS — R109 Unspecified abdominal pain: Secondary | ICD-10-CM

## 2020-01-02 DIAGNOSIS — R319 Hematuria, unspecified: Secondary | ICD-10-CM

## 2020-01-02 LAB — URINALYSIS, COMPLETE (UACMP) WITH MICROSCOPIC
Bacteria, UA: NONE SEEN
Bilirubin Urine: NEGATIVE
Glucose, UA: NEGATIVE mg/dL
Hgb urine dipstick: NEGATIVE
Ketones, ur: NEGATIVE mg/dL
Leukocytes,Ua: NEGATIVE
Nitrite: NEGATIVE
Protein, ur: NEGATIVE mg/dL
RBC / HPF: NONE SEEN RBC/hpf (ref 0–5)
Specific Gravity, Urine: 1.02 (ref 1.005–1.030)
pH: 6 (ref 5.0–8.0)

## 2020-01-02 LAB — BLADDER SCAN AMB NON-IMAGING

## 2020-01-03 ENCOUNTER — Ambulatory Visit: Payer: BC Managed Care – PPO | Admitting: Psychology

## 2020-01-04 ENCOUNTER — Ambulatory Visit (HOSPITAL_COMMUNITY)
Admission: RE | Admit: 2020-01-04 | Discharge: 2020-01-04 | Disposition: A | Discharge status: Home / Self Care | Payer: BC Managed Care – PPO | Source: Ambulatory Visit | Attending: Urology | Admitting: Urology

## 2020-01-04 ENCOUNTER — Other Ambulatory Visit: Payer: Self-pay

## 2020-01-04 ENCOUNTER — Ambulatory Visit (INDEPENDENT_AMBULATORY_CARE_PROVIDER_SITE_OTHER): Payer: BC Managed Care – PPO | Admitting: Psychology

## 2020-01-04 DIAGNOSIS — F4312 Post-traumatic stress disorder, chronic: Secondary | ICD-10-CM | POA: Diagnosis not present

## 2020-01-04 DIAGNOSIS — R109 Unspecified abdominal pain: Secondary | ICD-10-CM | POA: Diagnosis present

## 2020-01-05 ENCOUNTER — Ambulatory Visit: Payer: BC Managed Care – PPO | Admitting: Psychology

## 2020-01-09 ENCOUNTER — Ambulatory Visit (INDEPENDENT_AMBULATORY_CARE_PROVIDER_SITE_OTHER): Payer: BC Managed Care – PPO | Admitting: Psychology

## 2020-01-09 DIAGNOSIS — F4312 Post-traumatic stress disorder, chronic: Secondary | ICD-10-CM | POA: Diagnosis not present

## 2020-01-10 ENCOUNTER — Ambulatory Visit: Payer: BC Managed Care – PPO | Admitting: Psychology

## 2020-01-11 ENCOUNTER — Ambulatory Visit (INDEPENDENT_AMBULATORY_CARE_PROVIDER_SITE_OTHER): Payer: BC Managed Care – PPO | Admitting: Psychology

## 2020-01-11 DIAGNOSIS — F4312 Post-traumatic stress disorder, chronic: Secondary | ICD-10-CM

## 2020-01-12 ENCOUNTER — Telehealth: Payer: Self-pay

## 2020-01-12 NOTE — Telephone Encounter (Signed)
Patient called and requested refill on Baclofen. He called back and stated that [facility] has a refill on file for him.

## 2020-01-16 ENCOUNTER — Ambulatory Visit (INDEPENDENT_AMBULATORY_CARE_PROVIDER_SITE_OTHER): Payer: BC Managed Care – PPO | Admitting: Psychology

## 2020-01-16 DIAGNOSIS — F4312 Post-traumatic stress disorder, chronic: Secondary | ICD-10-CM | POA: Diagnosis not present

## 2020-01-17 ENCOUNTER — Telehealth: Payer: Self-pay | Admitting: Cardiovascular Disease

## 2020-01-17 ENCOUNTER — Ambulatory Visit: Payer: BC Managed Care – PPO | Admitting: Psychology

## 2020-01-17 NOTE — Telephone Encounter (Signed)
Returned the call to the patient. He stated that he is very frustrated. He still does not have his bipap. He stated that he was told in July that he would get a follow up call and this has never happened. He stated that he currently uses [facility] and all of the needed notes and documents had been sent to Dr. Evette Georges office.   He stated that he feels like Dr. Claiborne Billings does not want him as a patient and that if he really is as bad off as he was told then why has this not been taken care of yet.   He has been advised that we will get this taken care of.

## 2020-01-17 NOTE — Telephone Encounter (Signed)
Patient called and wanted to reschedule Dr. Evette Georges visit....Jesse Kitchentold patient he doesn't have any availability until after March. Patient stated that was unacceptable and needed something sooner. Wanted to speak with nurse regarding appt and bipap machine. Please call

## 2020-01-18 ENCOUNTER — Ambulatory Visit (INDEPENDENT_AMBULATORY_CARE_PROVIDER_SITE_OTHER): Payer: BC Managed Care – PPO | Admitting: Psychology

## 2020-01-18 ENCOUNTER — Encounter
Payer: BC Managed Care – PPO | Attending: Physical Medicine & Rehabilitation | Admitting: Physical Medicine & Rehabilitation

## 2020-01-18 ENCOUNTER — Other Ambulatory Visit: Payer: Self-pay

## 2020-01-18 ENCOUNTER — Encounter: Payer: Self-pay | Admitting: Physical Medicine & Rehabilitation

## 2020-01-18 DIAGNOSIS — R252 Cramp and spasm: Secondary | ICD-10-CM | POA: Diagnosis present

## 2020-01-18 DIAGNOSIS — L4050 Arthropathic psoriasis, unspecified: Secondary | ICD-10-CM

## 2020-01-18 DIAGNOSIS — G894 Chronic pain syndrome: Secondary | ICD-10-CM | POA: Diagnosis present

## 2020-01-18 DIAGNOSIS — F4312 Post-traumatic stress disorder, chronic: Secondary | ICD-10-CM | POA: Diagnosis not present

## 2020-01-18 DIAGNOSIS — G939 Disorder of brain, unspecified: Secondary | ICD-10-CM | POA: Diagnosis present

## 2020-01-18 DIAGNOSIS — M5416 Radiculopathy, lumbar region: Secondary | ICD-10-CM | POA: Diagnosis present

## 2020-01-18 MED ORDER — OXYCODONE HCL 10 MG PO TABS: 10.0000 mg | ORAL_TABLET | Freq: Three times a day (TID) | ORAL | 0 refills | Status: DC | PRN

## 2020-01-18 MED ORDER — BACLOFEN 20 MG PO TABS: 20.0000 mg | ORAL_TABLET | Freq: Four times a day (QID) | ORAL | 3 refills | Status: DC

## 2020-01-18 MED ORDER — MORPHINE SULFATE ER 60 MG PO CP24: 60.0000 mg | ORAL_CAPSULE | Freq: Two times a day (BID) | ORAL | 0 refills | Status: DC

## 2020-01-18 NOTE — Progress Notes (Signed)
Subjective:    Patient ID: Jesse Bowers, adult    DOB: 31-Jan-1966, 54 y.o.   MRN: [id number]  HPI   Jesse Bowers is here in follow up of his chronic pain and gait disorder. He's had 3 wrist surgeries this past year due to his erosive joint/CT disease. He was placed on MTX by his rheumatologist in June which has made a big difference in his overall joint pain. He tends to have a lot pain still at his right quad tendon, low back and troch bursae. He remains on kadian 57m q12 and oxycodone 180mq8 prn. He was using the oxycodone once a day prn prior to his right wrist surgery and pain at his right elbow, but has increased more recently because of increased right elbow pain.      Pain Inventory Average Pain 8 Pain Right Now 6 My pain is intermittent, sharp, tingling and aching  In the last 24 hours, has pain interfered with the following? General activity 9 Relation with others 8 Enjoyment of life 9 What TIME of day is your pain at its worst? daytime and evening Sleep (in general) Poor  Pain is worse with: walking, bending, sitting, standing and some activites Pain improves with: heat/ice, therapy/exercise, TENS and injections Relief from Meds: 5  Family History  Problem Relation Age of Onset  . Stroke Maternal Grandfather        [id number]. Heart attack Maternal Grandfather   . Glaucoma Maternal Grandfather   . Macular degeneration Maternal Grandfather   . Breast cancer Sister   . Hypertension Mother   . Psoriasis Mother   . Other Mother        meningioma developed ~2019  . Glaucoma Mother   . Cancer Paternal Grandfather   . Heart attack Paternal Grandfather   . Stroke Paternal Uncle        age 54. Polycythemia Paternal Uncle   . Stroke Maternal Grandmother   . Congestive Heart Failure Maternal Grandmother   . Heart attack Maternal Grandmother   . Protein C deficiency Sister [id number]     Miscarriages  . Breast cancer Maternal Aunt [id number] Social History   Socioeconomic History  .  Marital status: Married    Spouse name: Not on file  . Number of children: 2  . Years of education: 4y college  . Highest education level: Not on file  Occupational History  . Occupation: Pediatric Nurse practitioner    Comment: Not working since [id number]  Tobacco Use  . Smoking status: Never Smoker  . Smokeless tobacco: Never Used  Vaping Use  . Vaping Use: Never used  Substance and Sexual Activity  . Alcohol use: Yes    Comment: social  . Drug use: No  . Sexual activity: Yes    Birth control/protection: None    Comment: patient is a transgender on testosterone shots, no biological kids  Other Topics Concern  . 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 [place] was in [place] last 4 years [place] wife is working as HRScientist, research (physical sciences)or VoE[employer] CorporationPatient is not working and applying for disability. They have 2 kids but no biologic children.    Social Determinants of HeRadio broadcast assistant  Strain:   . Difficulty of Paying Living Expenses: Not on file  Food Insecurity:   . Worried About Charity fundraiser in the Last Year: Not on file  . Ran Out of Food in the Last Year: Not on file  Transportation Needs:   . Lack of Transportation (Medical): Not on file  . Lack of Transportation (Non-Medical): Not on file  Physical Activity:   . Days of Exercise per Week: Not on file  . Minutes of Exercise per Session: Not on file  Stress:   . Feeling of Stress : Not on file  Social Connections:   . Frequency of Communication with Friends and Family: Not on file  . Frequency of Social Gatherings with Friends and Family: Not on file  . Attends Religious Services: Not on file  . Active Member of Clubs or Organizations: Not on file  . Attends Archivist Meetings:  Not on file  . Marital Status: Not on file   Past Surgical History:  Procedure Laterality Date  . ABDOMINAL HYSTERECTOMY Bilateral 1994   TAH, BSO- tranverse incision at 54 yo  . ANKLE ARTHROSCOPY WITH RECONSTRUCTION Right 2007  . CHOLECYSTECTOMY     laparoscopic  . COLONOSCOPY     x3  . EYE SURGERY     Left eye 03/02/2018, right 02/15/2018  . HIP ARTHROSCOPY W/ LABRAL REPAIR Right 05/11/2013   acetabular labral tear 03/30/2013  . KNEE ARTHROPLASTY Right   . KNEE JOINT MANIPULATION Left    x3 under anesthesia  . KNEE SURGERY Bilateral 1984   Right ACL, left PCL repair  . LITHOTRIPSY  2005  . LIVER BIOPSY  2013   normal results.  [name] MASTECTOMY Bilateral    prior to 2009  . MOUTH SURGERY    . NASAL SEPTUM SURGERY N/A 09/20/2015   by ENT Dr. [name]  . OVARIAN CYST SURGERY Left    size of grapefruit, was informed that she had shortened vagina  . SHOULDER SURGERY Bilateral    Right 08/15/2016, Left 11/15/2016  . THUMB ARTHROSCOPY Left   . THYROIDECTOMY, PARTIAL Left 2008  . TOTAL KNEE ARTHROPLASTY Right 08/23/2018   Procedure: TOTAL KNEE ARTHROPLASTY;  Surgeon: [name], MD;  Location: [facility];  Service: Orthopedics;  Laterality: Right;  85mn  . TOTAL KNEE REVISION Left 02/06/2016   Procedure: LEFT TOTAL KNEE REVISION;  Surgeon: FGaynelle Arabian MD;  Location: [facility];  Service: Orthopedics;  Laterality: Left;  . TOTAL KNEE REVISION Left 04/22/2017   Procedure: Left knee polyethylene revision;  Surgeon: AGaynelle Arabian MD;  Location: [facility];  Service: Orthopedics;  Laterality: Left;  . UPPER GI ENDOSCOPY  2003   Past Surgical History:  Procedure Laterality Date  . ABDOMINAL HYSTERECTOMY Bilateral 1994   TAH, BSO- tranverse incision at 54yo  . ANKLE ARTHROSCOPY WITH RECONSTRUCTION Right 2007  . CHOLECYSTECTOMY     laparoscopic  . COLONOSCOPY     x3  . EYE SURGERY     Left eye 03/02/2018, right 02/15/2018  . HIP ARTHROSCOPY W/ LABRAL REPAIR Right 05/11/2013   acetabular  labral tear 03/30/2013  . KNEE ARTHROPLASTY Right   . KNEE JOINT MANIPULATION Left    x3 under anesthesia  . KNEE SURGERY Bilateral 1984   Right ACL, left PCL repair  . LITHOTRIPSY  2005  . LIVER BIOPSY  2013   normal results.  .Marland KitchenMASTECTOMY Bilateral    prior to 2009  . MOUTH SURGERY    . NASAL SEPTUM  SURGERY N/A 09/20/2015   by ENT Dr. [name]  . OVARIAN CYST SURGERY Left    size of grapefruit, was informed that she had shortened vagina  . SHOULDER SURGERY Bilateral    Right 08/15/2016, Left 11/15/2016  . THUMB ARTHROSCOPY Left   . THYROIDECTOMY, PARTIAL Left 2008  . TOTAL KNEE ARTHROPLASTY Right 08/23/2018   Procedure: TOTAL KNEE ARTHROPLASTY;  Surgeon: [name], MD;  Location: [facility];  Service: Orthopedics;  Laterality: Right;  77mn  . TOTAL KNEE REVISION Left 02/06/2016   Procedure: LEFT TOTAL KNEE REVISION;  Surgeon: FGaynelle Arabian MD;  Location: [facility];  Service: Orthopedics;  Laterality: Left;  . TOTAL KNEE REVISION Left 04/22/2017   Procedure: Left knee polyethylene revision;  Surgeon: AGaynelle Arabian MD;  Location: [facility];  Service: Orthopedics;  Laterality: Left;  . UPPER GI ENDOSCOPY  2003   Past Medical History:  Diagnosis Date  . Abnormal weight loss   . Anxiety   . Arthritis   . Cataract    OU  . Celiac disease   . Cervical neck pain with evidence of disc disease    patient has a cyst   . Chronic constipation   . Chronic diastolic heart failure ([facility]    Pt. denies  . Chronic pain   . Degenerative disc disease at L5-S1 level    with stenosis  . DVT (deep venous thrombosis) (HCC)    Right upper arm, bilateral leg  . Eczema    inguinal, feet  . Elevated liver enzymes   . Failed total knee arthroplasty ([facility] 04/22/2017  . Family history of adverse reaction to anesthesia    family has problems with anesthesia of nausea and vomiting   . 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 GERD (gastroesophageal reflux disease)    History of in 20's  . Gluten enteropathy    . H/O parotitis    right   . Hard of hearing   . History of kidney stones   . History of retinal tear    Bilateral  . History of staph infection    required wound vac  . Hx-TIA (transient ischemic attack)    2015  . LVH (left ventricular hypertrophy) 12/15/2016   Mild, noted on ECHO  . MVP (mitral valve prolapse)   . NAFL (nonalcoholic fatty liver)   . Neck pain   . Neuromuscular disorder ([place]    bilateral neuropathy feet.  . Pneumonia 12/17/2010  . Polycythemia   . Polycythemia, secondary   . PONV (postoperative nausea and vomiting)   . Protein C deficiency ([facility]    Dr. FAnne Fu . Psoriasis    16 X10 cm psoriatic rash on sole of left foot ; open and occ scant bleeding;   . psoriatic arthritis   . PTSD (post-traumatic stress disorder)   . Scaphoid fracture of wrist 09/23/2013  . Seizure ([place]    childhood, medication until age [age]then weanned completely off  . Sleep apnea    split night study last done by Dr. SFelecia Shelling4/17/17 shows severe OSA, CSA, and hypersomnia, rec bipap  . Splenomegaly   . Stenosis of ureteropelvic junction (UPJ)    left  . Stroke ([facility]    CVA vs TIA in left cerebrum causing slight right sided weakness-Dr. SFelecia Shellingfollows  . Syrinx of spinal cord ([facility] 01/06/2014   c spine on MRI  . Tachycardia    hx of   . Transfusion history    past history- none recent, after  surgeries due to blood loss  . Wears glasses   . Wears hearing aid    There were no vitals taken for this visit.  Opioid Risk Score:   Fall Risk Score:  `1  Depression screen PHQ 2/9  No flowsheet data found.  Review of Systems  Constitutional: Negative.   HENT: Negative.   Eyes: Negative.   Respiratory: Negative.   Cardiovascular: Negative.   Gastrointestinal: Negative.   Endocrine: Negative.   Genitourinary: Negative.   Musculoskeletal: Positive for arthralgias, gait problem and myalgias.  Skin: Negative.   Hematological: Bruises/bleeds easily.       Xarelto   Psychiatric/Behavioral: Negative.   All other systems reviewed and are negative.      Objective:   Physical Exam  General: No acute distress HEENT: EOMI, oral membranes moist Cards: reg rate  Chest: normal effort Abdomen: Soft, NT, ND Skin: dry, intact Extremities: no edema Neuro: Pt is pleasant and alert and oriented x 3.   Strength is grossly 3-4/5 based on pain, splint.   reflexies are hyperactive. patchy sensory loss in legs..   Musculoskeletal:  shoulder pain with IR/ER, right srist swelling and right elbow pain at extensor compartment. Psych: pleasant and cooperative     Assessment & Plan:   1. Psoriatic arthritis with pain in multiple areas, most prominently feet, hands, elbows. His pain is also related to his prior CVA and associated motor/sensory change   2. 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  3. Hx of left total knee revision again on 04/21/17 per [facility] and right TKA 08/2018 4. Chronic  low back pain---MRI with severe DDD at L5-S1. Left S1 radiculopathy? S/p LEFT L5-S1 ESI 5. Protein C deficiency   6. Left shoulder subluxation, bicipital tendonitis  7. Central sleep apnea  8. Polycythemia  9. Depression with anxiety.  10. Left lateral epidondylitis  11. 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  left sided weakness can fluctuate. Most recent cervical MRI (07/08/17) without significant change 12. 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 [place] 14. Right wrist psoriatic arthritis, tendonapthy, ganglion cysts s/p multiple recent surgeries.    Plan:  1. Continue effexor 375m xr daily  2. HEP as possible 3. Voltaren gel to hands, feet, knees, elbows--- d 4. Continue baclofen for lower extermity spasms/back pain. 20 mg 4 times daily #120.  5. Refilled  kadian [id number] #60 and oxycodone 1012m70 again today We will continue  the controlled substance monitoring program, this consists of regular clinic visits, examinations, routine drug screening, pill counts as well as use of [place]. [place].  -will consider further taper depending upon how his right elbow is doing. He may call for RF in about 3 weeks.      6. encouraged use of forearm compressive ban, ice in short term to help support extensor tendon 7. Has had good results with left L5-S1 translaminar injection per Dr. KirGeorge Hughother injection if needed 8: constipation             -continue miralax      Fifteen minutes of face to face patient care time were spent during this visit. All questions were encouraged and answered.  Follow up with me in 2 mos .

## 2020-01-18 NOTE — Patient Instructions (Signed)
PLEASE FEEL FREE TO CALL OUR OFFICE WITH ANY PROBLEMS OR QUESTIONS ([phone])      

## 2020-01-23 ENCOUNTER — Ambulatory Visit (INDEPENDENT_AMBULATORY_CARE_PROVIDER_SITE_OTHER): Payer: BC Managed Care – PPO | Admitting: Psychology

## 2020-01-23 ENCOUNTER — Ambulatory Visit: Payer: BC Managed Care – PPO | Admitting: Cardiovascular Disease

## 2020-01-23 DIAGNOSIS — F4312 Post-traumatic stress disorder, chronic: Secondary | ICD-10-CM | POA: Diagnosis not present

## 2020-01-24 ENCOUNTER — Ambulatory Visit: Payer: BC Managed Care – PPO | Admitting: Psychology

## 2020-01-24 ENCOUNTER — Inpatient Hospital Stay: Payer: BC Managed Care – PPO

## 2020-01-24 ENCOUNTER — Encounter: Payer: Self-pay | Admitting: Hematology

## 2020-01-25 ENCOUNTER — Ambulatory Visit: Payer: BC Managed Care – PPO | Admitting: Psychology

## 2020-01-30 ENCOUNTER — Ambulatory Visit (INDEPENDENT_AMBULATORY_CARE_PROVIDER_SITE_OTHER): Payer: BC Managed Care – PPO | Admitting: Psychology

## 2020-01-30 DIAGNOSIS — F4312 Post-traumatic stress disorder, chronic: Secondary | ICD-10-CM

## 2020-01-31 ENCOUNTER — Ambulatory Visit: Payer: BC Managed Care – PPO | Admitting: Psychology

## 2020-02-01 ENCOUNTER — Ambulatory Visit (INDEPENDENT_AMBULATORY_CARE_PROVIDER_SITE_OTHER): Payer: BC Managed Care – PPO | Admitting: Psychology

## 2020-02-01 DIAGNOSIS — F4312 Post-traumatic stress disorder, chronic: Secondary | ICD-10-CM

## 2020-02-02 ENCOUNTER — Encounter: Payer: Self-pay | Admitting: Hematology

## 2020-02-06 ENCOUNTER — Ambulatory Visit (INDEPENDENT_AMBULATORY_CARE_PROVIDER_SITE_OTHER): Payer: BC Managed Care – PPO | Admitting: Psychology

## 2020-02-06 DIAGNOSIS — F4312 Post-traumatic stress disorder, chronic: Secondary | ICD-10-CM | POA: Diagnosis not present

## 2020-02-07 ENCOUNTER — Ambulatory Visit: Payer: BC Managed Care – PPO | Admitting: Psychology

## 2020-02-13 ENCOUNTER — Ambulatory Visit (INDEPENDENT_AMBULATORY_CARE_PROVIDER_SITE_OTHER): Payer: BC Managed Care – PPO | Admitting: Psychology

## 2020-02-13 DIAGNOSIS — F4312 Post-traumatic stress disorder, chronic: Secondary | ICD-10-CM

## 2020-02-15 ENCOUNTER — Ambulatory Visit (INDEPENDENT_AMBULATORY_CARE_PROVIDER_SITE_OTHER): Payer: BC Managed Care – PPO | Admitting: Psychology

## 2020-02-15 DIAGNOSIS — F4312 Post-traumatic stress disorder, chronic: Secondary | ICD-10-CM

## 2020-02-20 ENCOUNTER — Ambulatory Visit (INDEPENDENT_AMBULATORY_CARE_PROVIDER_SITE_OTHER): Payer: BC Managed Care – PPO | Admitting: Psychology

## 2020-02-20 DIAGNOSIS — F4312 Post-traumatic stress disorder, chronic: Secondary | ICD-10-CM | POA: Diagnosis not present

## 2020-02-22 ENCOUNTER — Ambulatory Visit: Payer: BC Managed Care – PPO | Admitting: Psychology

## 2020-02-27 ENCOUNTER — Ambulatory Visit: Payer: BC Managed Care – PPO | Admitting: Psychology

## 2020-02-29 ENCOUNTER — Ambulatory Visit: Payer: BC Managed Care – PPO | Admitting: Psychology

## 2020-03-05 ENCOUNTER — Ambulatory Visit: Payer: BC Managed Care – PPO | Admitting: Psychology

## 2020-03-05 ENCOUNTER — Encounter: Payer: Self-pay | Admitting: Hematology

## 2020-03-06 ENCOUNTER — Telehealth: Payer: Self-pay | Admitting: *Deleted

## 2020-03-06 DIAGNOSIS — L4050 Arthropathic psoriasis, unspecified: Secondary | ICD-10-CM

## 2020-03-06 DIAGNOSIS — M5416 Radiculopathy, lumbar region: Secondary | ICD-10-CM

## 2020-03-06 NOTE — Telephone Encounter (Signed)
Jesse Bowers called for his refills. He needs today.

## 2020-03-07 ENCOUNTER — Ambulatory Visit (INDEPENDENT_AMBULATORY_CARE_PROVIDER_SITE_OTHER): Payer: BC Managed Care – PPO | Admitting: Psychology

## 2020-03-07 ENCOUNTER — Other Ambulatory Visit: Payer: Self-pay

## 2020-03-07 DIAGNOSIS — F4312 Post-traumatic stress disorder, chronic: Secondary | ICD-10-CM | POA: Diagnosis not present

## 2020-03-07 DIAGNOSIS — R252 Cramp and spasm: Secondary | ICD-10-CM

## 2020-03-07 MED ORDER — MORPHINE SULFATE ER 60 MG PO CP24: 60.0000 mg | ORAL_CAPSULE | Freq: Two times a day (BID) | ORAL | 0 refills | Status: DC

## 2020-03-07 MED ORDER — OXYCODONE HCL 10 MG PO TABS
10.0000 mg | ORAL_TABLET | Freq: Three times a day (TID) | ORAL | 0 refills | Status: DC | PRN
Start: 2020-03-07 — End: 2020-03-21

## 2020-03-07 MED ORDER — BACLOFEN 20 MG PO TABS: 20.0000 mg | ORAL_TABLET | Freq: Four times a day (QID) | ORAL | 3 refills | Status: DC

## 2020-03-07 NOTE — Telephone Encounter (Signed)
RF's signed and sent.  thx!

## 2020-03-12 ENCOUNTER — Ambulatory Visit (INDEPENDENT_AMBULATORY_CARE_PROVIDER_SITE_OTHER): Payer: BC Managed Care – PPO | Admitting: Psychology

## 2020-03-12 DIAGNOSIS — F4312 Post-traumatic stress disorder, chronic: Secondary | ICD-10-CM | POA: Diagnosis not present

## 2020-03-14 ENCOUNTER — Ambulatory Visit (INDEPENDENT_AMBULATORY_CARE_PROVIDER_SITE_OTHER): Payer: BC Managed Care – PPO | Admitting: Psychology

## 2020-03-14 DIAGNOSIS — F4312 Post-traumatic stress disorder, chronic: Secondary | ICD-10-CM

## 2020-03-19 ENCOUNTER — Ambulatory Visit (INDEPENDENT_AMBULATORY_CARE_PROVIDER_SITE_OTHER): Payer: BC Managed Care – PPO | Admitting: Psychology

## 2020-03-19 DIAGNOSIS — F4312 Post-traumatic stress disorder, chronic: Secondary | ICD-10-CM | POA: Diagnosis not present

## 2020-03-20 DIAGNOSIS — L4050 Arthropathic psoriasis, unspecified: Secondary | ICD-10-CM

## 2020-03-20 DIAGNOSIS — I6330 Cerebral infarction due to thrombosis of unspecified cerebral artery: Secondary | ICD-10-CM

## 2020-03-20 DIAGNOSIS — I69320 Aphasia following cerebral infarction: Secondary | ICD-10-CM

## 2020-03-20 DIAGNOSIS — M5416 Radiculopathy, lumbar region: Secondary | ICD-10-CM

## 2020-03-20 NOTE — Telephone Encounter (Signed)
Orders sent for pt, ot, slp at [facility]

## 2020-03-21 ENCOUNTER — Other Ambulatory Visit: Payer: Self-pay

## 2020-03-21 ENCOUNTER — Encounter
Payer: BC Managed Care – PPO | Attending: Physical Medicine & Rehabilitation | Admitting: Physical Medicine & Rehabilitation

## 2020-03-21 ENCOUNTER — Telehealth: Payer: Self-pay | Admitting: Cardiovascular Disease

## 2020-03-21 ENCOUNTER — Ambulatory Visit (INDEPENDENT_AMBULATORY_CARE_PROVIDER_SITE_OTHER): Payer: BC Managed Care – PPO | Admitting: Psychology

## 2020-03-21 ENCOUNTER — Encounter: Payer: Self-pay | Admitting: Physical Medicine & Rehabilitation

## 2020-03-21 VITALS — BP 126/84 | HR 89 | Temp 98.6°F | Ht 69.0 in | Wt 232.0 lb

## 2020-03-21 DIAGNOSIS — I5032 Chronic diastolic (congestive) heart failure: Secondary | ICD-10-CM

## 2020-03-21 DIAGNOSIS — M5416 Radiculopathy, lumbar region: Secondary | ICD-10-CM | POA: Insufficient documentation

## 2020-03-21 DIAGNOSIS — L4050 Arthropathic psoriasis, unspecified: Secondary | ICD-10-CM | POA: Diagnosis present

## 2020-03-21 DIAGNOSIS — M75101 Unspecified rotator cuff tear or rupture of right shoulder, not specified as traumatic: Secondary | ICD-10-CM | POA: Insufficient documentation

## 2020-03-21 DIAGNOSIS — F4312 Post-traumatic stress disorder, chronic: Secondary | ICD-10-CM

## 2020-03-21 DIAGNOSIS — R0602 Shortness of breath: Secondary | ICD-10-CM

## 2020-03-21 DIAGNOSIS — K5903 Drug induced constipation: Secondary | ICD-10-CM | POA: Insufficient documentation

## 2020-03-21 DIAGNOSIS — T402X5A Adverse effect of other opioids, initial encounter: Secondary | ICD-10-CM

## 2020-03-21 DIAGNOSIS — D751 Secondary polycythemia: Secondary | ICD-10-CM

## 2020-03-21 MED ORDER — LINACLOTIDE 145 MCG PO CAPS: 145.0000 ug | ORAL_CAPSULE | Freq: Every day | ORAL | 3 refills | Status: DC

## 2020-03-21 MED ORDER — MORPHINE SULFATE ER 60 MG PO CP24
60.0000 mg | ORAL_CAPSULE | Freq: Two times a day (BID) | ORAL | 0 refills | Status: DC
Start: 2020-03-21 — End: 2020-03-22

## 2020-03-21 MED ORDER — RIVAROXABAN 20 MG PO TABS: ORAL_TABLET | ORAL | 3 refills | Status: DC

## 2020-03-21 MED ORDER — OXYCODONE HCL 10 MG PO TABS
10.0000 mg | ORAL_TABLET | Freq: Three times a day (TID) | ORAL | 0 refills | Status: DC | PRN
Start: 2020-03-21 — End: 2020-03-22

## 2020-03-21 NOTE — Patient Instructions (Signed)
PLEASE FEEL FREE TO CALL OUR OFFICE WITH ANY PROBLEMS OR QUESTIONS ([phone])      

## 2020-03-21 NOTE — Telephone Encounter (Signed)
Please order echo in March for chronic diastolic heart failure and shortness of breath

## 2020-03-21 NOTE — Telephone Encounter (Signed)
Patient is requesting an order to have an echo prior to following up with Dr. [name].

## 2020-03-21 NOTE — Progress Notes (Signed)
Subjective:     Patient ID: Jesse Bowers, male   DOB: [birth year], 55 y.o.   MRN: [id number]  HPI  Jesse Bowers is here in follow up of his CVA and multiple associated deficits, chronic pain. He has had some ongoing pain in his right elbow. An MRI was ordered to reassess. He reports continued problems sleeping. He's off prazosin per cardiology recs.   He is using miralax for OIC but still is having significant constipation. He eventually will have a blowout after a few days  He remains on kadian and oxycodone for pain control.   Pain Inventory Average Pain 5 Pain Right Now 7 My pain is constant, sharp, burning, stabbing and aching  In the last 24 hours, has pain interfered with the following? General activity 9 Relation with others 8 Enjoyment of life 9 What TIME of day is your pain at its worst? daytime and night Sleep (in general) Poor  Pain is worse with: walking, bending, sitting, standing and some activites Pain improves with: rest, therapy/exercise, medication and injections Relief from Meds: 5  Family History  Problem Relation Age of Onset  . Stroke Maternal Grandfather        11  . Heart attack Maternal Grandfather   . Glaucoma Maternal Grandfather   . Macular degeneration Maternal Grandfather   . Breast cancer Sister   . Hypertension Mother   . Psoriasis Mother   . Other Mother        meningioma developed ~2019  . Glaucoma Mother   . Cancer Paternal Grandfather   . Heart attack Paternal Grandfather   . Stroke Paternal Uncle        age 27  . Polycythemia Paternal Uncle   . Stroke Maternal Grandmother   . Congestive Heart Failure Maternal Grandmother   . Heart attack Maternal Grandmother   . Protein C deficiency Sister 60       Miscarriages  . Breast cancer Maternal Aunt 73   Social History   Socioeconomic History  . Marital status: Married    Spouse name: Not on file  . Number of children: 2  . Years of education: 4y college  . Highest education level: Not  on file  Occupational History  . Occupation: Pediatric Nurse practitioner    Comment: Not working since [place] 2015  Tobacco Use  . Smoking status: Never Smoker  . Smokeless tobacco: Never Used  Vaping Use  . Vaping Use: Never used  Substance and Sexual Activity  . Alcohol use: Yes    Comment: social  . Drug use: No  . Sexual activity: Yes    Birth control/protection: None    Comment: patient is a transgender on testosterone shots, no biological kids  Other Topics Concern  . Not on file  Social History Narrative   Education 4 year college, former Therapist, sports X 15 years, pediatric nurse practitioner x 6 years, did NP degree from [employer] [place]. Relocated to [place] about 2 months ago from [place], [place]. Patient was in [place] for last 4 years and prior to that in [place]. His wife is working as Scientist, research (physical sciences) for [employer]. Patient is not working and applying for disability. They have 2 kids but no biologic children.    Social Determinants of Health   Financial Resource Strain: Not on file  Food Insecurity: Not on file  Transportation Needs: Not on file  Physical Activity: Not on file  Stress: Not on file  Social Connections: Not on file   Past Surgical  History:  Procedure Laterality Date  . ABDOMINAL HYSTERECTOMY Bilateral 1994   TAH, BSO- tranverse incision at 55 yo  . ANKLE ARTHROSCOPY WITH RECONSTRUCTION Right 2007  . CHOLECYSTECTOMY     laparoscopic  . COLONOSCOPY     x3  . EYE SURGERY     Left eye 03/02/2018, right 02/15/2018  . HIP ARTHROSCOPY W/ LABRAL REPAIR Right 05/11/2013   acetabular labral tear 03/30/2013  . KNEE ARTHROPLASTY Right   . KNEE JOINT MANIPULATION Left    x3 under anesthesia  . KNEE SURGERY Bilateral 1984   Right ACL, left PCL repair  . LITHOTRIPSY  2005  . LIVER BIOPSY  2013   normal results.  [name] MASTECTOMY Bilateral    prior to 2009  . MOUTH SURGERY    . NASAL SEPTUM SURGERY N/A 09/20/2015   by ENT Dr. [name]  . OVARIAN CYST SURGERY Left     size of grapefruit, was informed that she had shortened vagina  . SHOULDER SURGERY Bilateral    Right 08/15/2016, Left 11/15/2016  . THUMB ARTHROSCOPY Left   . THYROIDECTOMY, PARTIAL Left 2008  . TOTAL KNEE ARTHROPLASTY Right 08/23/2018   Procedure: TOTAL KNEE ARTHROPLASTY;  Surgeon: [name], MD;  Location: [facility];  Service: Orthopedics;  Laterality: Right;  43mn  . TOTAL KNEE REVISION Left 02/06/2016   Procedure: LEFT TOTAL KNEE REVISION;  Surgeon: FGaynelle Arabian MD;  Location: [facility];  Service: Orthopedics;  Laterality: Left;  . TOTAL KNEE REVISION Left 04/22/2017   Procedure: Left knee polyethylene revision;  Surgeon: AGaynelle Arabian MD;  Location: [facility];  Service: Orthopedics;  Laterality: Left;  . UPPER GI ENDOSCOPY  2003   Past Surgical History:  Procedure Laterality Date  . ABDOMINAL HYSTERECTOMY Bilateral 1994   TAH, BSO- tranverse incision at 55yo  . ANKLE ARTHROSCOPY WITH RECONSTRUCTION Right 2007  . CHOLECYSTECTOMY     laparoscopic  . COLONOSCOPY     x3  . EYE SURGERY     Left eye 03/02/2018, right 02/15/2018  . HIP ARTHROSCOPY W/ LABRAL REPAIR Right 05/11/2013   acetabular labral tear 03/30/2013  . KNEE ARTHROPLASTY Right   . KNEE JOINT MANIPULATION Left    x3 under anesthesia  . KNEE SURGERY Bilateral 1984   Right ACL, left PCL repair  . LITHOTRIPSY  2005  . LIVER BIOPSY  2013   normal results.  .Marland KitchenMASTECTOMY Bilateral    prior to 2009  . MOUTH SURGERY    . NASAL SEPTUM SURGERY N/A 09/20/2015   by ENT Dr. NLucia Gaskins . OVARIAN CYST SURGERY Left    size of grapefruit, was informed that she had shortened vagina  . SHOULDER SURGERY Bilateral    Right 08/15/2016, Left 11/15/2016  . THUMB ARTHROSCOPY Left   . THYROIDECTOMY, PARTIAL Left 2008  . TOTAL KNEE ARTHROPLASTY Right 08/23/2018   Procedure: TOTAL KNEE ARTHROPLASTY;  Surgeon: AGaynelle Arabian MD;  Location: [facility];  Service: Orthopedics;  Laterality: Right;  559m  . TOTAL KNEE REVISION Left 02/06/2016    Procedure: LEFT TOTAL KNEE REVISION;  Surgeon: FrGaynelle ArabianMD;  Location: [facility];  Service: Orthopedics;  Laterality: Left;  . TOTAL KNEE REVISION Left 04/22/2017   Procedure: Left knee polyethylene revision;  Surgeon: AlGaynelle ArabianMD;  Location: [facility];  Service: Orthopedics;  Laterality: Left;  . UPPER GI ENDOSCOPY  2003   Past Medical History:  Diagnosis Date  . Abnormal weight loss   . Anxiety   . Arthritis   .  Cataract    OU  . Celiac disease   . Cervical neck pain with evidence of disc disease    patient has a cyst   . Chronic constipation   . Chronic diastolic heart failure ([place])    Pt. denies  . Chronic pain   . Degenerative disc disease at L5-S1 level    with stenosis  . DVT (deep venous thrombosis) ([facility])    Right upper arm, bilateral leg  . Eczema    inguinal, feet  . Elevated liver enzymes   . 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 Family history of adverse reaction to anesthesia    family has problems with anesthesia of nausea and vomiting   . 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 GERD (gastroesophageal reflux disease)    History of in 20's  . Gluten enteropathy   . H/O parotitis    right   . Hard of hearing   . History of kidney stones   . History of retinal tear    Bilateral  . History of staph infection    required wound vac  . Hx-TIA (transient ischemic attack)    2015  . LVH (left ventricular hypertrophy) 12/15/2016   Mild, noted on ECHO  . MVP (mitral valve prolapse)   . NAFL (nonalcoholic fatty liver)   . Neck pain   . Neuromuscular disorder ([place])    bilateral neuropathy feet.  . Pneumonia 12/17/2010  . Polycythemia   . Polycythemia, secondary   . PONV (postoperative nausea and vomiting)   . Protein C deficiency ([place])    Dr. Anne Fu  . Psoriasis    16 X10 cm psoriatic rash on sole of left foot ; open and occ scant bleeding;   . psoriatic arthritis   . PTSD (post-traumatic stress disorder)   . Scaphoid fracture of  wrist 09/23/2013  . Seizure ([place])    childhood, medication until age 27 then weanned completely off  . Sleep apnea    split night study last done by Dr. [name] 06/18/15 shows severe OSA, CSA, and hypersomnia, rec bipap  . Splenomegaly   . Stenosis of ureteropelvic junction (UPJ)    left  . Stroke [facility])    CVA vs TIA in left cerebrum causing slight right sided weakness-Dr. [name] follows  . 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 Tachycardia    hx of   . Transfusion history    past history- none recent, after surgeries due to blood loss  . Wears glasses   . Wears hearing aid    BP 126/84   Pulse 89   Temp 98.6 F (37 C)   Ht 5' 9"  (1.753 m)   Wt 232 lb (105.2 kg)   SpO2 96%   BMI 34.26 kg/m   Opioid Risk Score:   Fall Risk Score:  `1  Depression screen PHQ 2/9  No flowsheet data found. Review of Systems  Musculoskeletal: Positive for back pain, gait problem and neck pain.       Right thumb, elbow & thumb  All other systems reviewed and are negative.      Objective:   Physical Exam      General: No acute distress HEENT: EOMI, oral membranes moist Cards: reg rate  Chest: normal effort Abdomen: Soft, NT, ND Skin: dry, intact Extremities: no edema Psych: pleasant and appropriate Neuro: Pt is pleasant and alert and oriented x 3.   Strength is grossly 3-4/5 based  on pain, splint.   reflexies are hyperactive. patchy sensory loss in legs..   Musculoskeletal:  shoulder pain with IR/ER, right srist swelling and right elbow pain at extensor compartment persists     Assessment & Plan:   1. Psoriatic arthritis with pain in multiple areas, most prominently feet, hands, elbows. His pain is also related to his prior CVA and associated motor/sensory change   2. 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  3. Hx of left  total knee revision again on 04/21/17 per [facility] and right TKA 08/2018 4. Chronic  low back pain---MRI with severe DDD at L5-S1. Left S1 radiculopathy? S/p LEFT L5-S1 ESI 5. Protein C deficiency   6. Left shoulder subluxation, bicipital tendonitis  7. Central sleep apnea  8. Polycythemia  9. Depression with anxiety.  10. Left lateral epidondylitis  11. 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  left sided weakness can fluctuate. Most recent cervical MRI (07/08/17) without significant change 12. 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 [place] 14. Right wrist psoriatic arthritis, tendonapthy, ganglion cysts s/p multiple recent surgeries.     Plan:  1. Continue effexor 366m xr daily  2. HEP as possible 3. Voltaren gel to hands, feet, knees, elbows--- d 4. Continue baclofen for lower extermity spasms/back pain. 20 mg 4 times daily #120.  5. Refilled  kadian [id number] #60 and oxycodone 1040m70 again today We will continue the controlled substance monitoring program, this consists of regular clinic visits, examinations, routine drug screening, pill counts as well [place] use [place].   -consider further taper depending upon how his right elbow is doing. He may call for RF in about 3 weeks.      6. continued use of forearm compressive ban, ice in short term to help support extensor tendon  -MRI pending of elbow 7. Has had good results with left L5-S1 translaminar injection per Dr. KirGeorge Hughother injection if needed 8: constipation             -trial of linzess [id number]  Fifteen minutes of face to face patient care time were spent during this visit. All questions were encouraged and answered.  Follow up with np in 2 mos .

## 2020-03-21 NOTE — Telephone Encounter (Signed)
Attempted to call patient to see if symptoms have worsened since last echo in March of 2022. Attempted x 2, voicemail left.   Will forward to Dr. [name] to see if he would like to order repeat echo before patient follows up in April.

## 2020-03-22 ENCOUNTER — Telehealth: Payer: Self-pay | Admitting: *Deleted

## 2020-03-22 ENCOUNTER — Encounter: Payer: Self-pay | Admitting: *Deleted

## 2020-03-22 DIAGNOSIS — M5416 Radiculopathy, lumbar region: Secondary | ICD-10-CM

## 2020-03-22 DIAGNOSIS — L4050 Arthropathic psoriasis, unspecified: Secondary | ICD-10-CM

## 2020-03-22 MED ORDER — OXYCODONE HCL 10 MG PO TABS
10.0000 mg | ORAL_TABLET | Freq: Three times a day (TID) | ORAL | 0 refills | Status: DC | PRN
Start: 2020-03-22 — End: 2020-03-22

## 2020-03-22 MED ORDER — MORPHINE SULFATE ER 60 MG PO CP24: 60.0000 mg | ORAL_CAPSULE | Freq: Two times a day (BID) | ORAL | 0 refills | Status: DC

## 2020-03-22 MED ORDER — OXYCODONE HCL 10 MG PO TABS: 10.0000 mg | ORAL_TABLET | Freq: Three times a day (TID) | ORAL | 0 refills | Status: DC | PRN

## 2020-03-22 MED ORDER — MORPHINE SULFATE ER 60 MG PO CP24
60.0000 mg | ORAL_CAPSULE | Freq: Two times a day (BID) | ORAL | 0 refills | Status: DC
Start: 2020-03-22 — End: 2020-03-22

## 2020-03-22 NOTE — Telephone Encounter (Signed)
Ridhaan called and reported that his pain Morphine Sulfate and Oxycodone meds went to the wrong pharmacy.  He does not use [facility] and doesn't know why it is on his list. His meds need to go to [facility].  I have canceled the Rx at [facility] and will ask Zella Ball to send to his pharmacy since Dr [name] is off this afternoon.

## 2020-03-22 NOTE — Addendum Note (Signed)
Addended by: Alger Simons T on: 03/22/2020 06:03 PM   Modules accepted: Orders

## 2020-03-22 NOTE — Telephone Encounter (Signed)
PMP was reviewed:  Morphne and Oxycodone e-scribed today. Jesse Bowers is aware of the above via My-Chart message.

## 2020-03-26 ENCOUNTER — Ambulatory Visit: Payer: BC Managed Care – PPO | Admitting: Psychology

## 2020-03-28 ENCOUNTER — Ambulatory Visit (INDEPENDENT_AMBULATORY_CARE_PROVIDER_SITE_OTHER): Payer: BC Managed Care – PPO | Admitting: Psychology

## 2020-03-28 DIAGNOSIS — F4312 Post-traumatic stress disorder, chronic: Secondary | ICD-10-CM | POA: Diagnosis not present

## 2020-03-28 NOTE — Telephone Encounter (Signed)
OK to take prazosin 1 mg at bedtime. It will not lower the heart rate. The concern is orthostatic hypotension - severe drop in BP when going from laying or sitting to standing. Cjhange positojn very carefully when getting up, especially in the morning.

## 2020-04-02 ENCOUNTER — Other Ambulatory Visit: Payer: Self-pay | Admitting: Family Medicine

## 2020-04-02 ENCOUNTER — Ambulatory Visit
Admission: RE | Admit: 2020-04-02 | Discharge: 2020-04-02 | Disposition: A | Discharge status: Home / Self Care | Payer: BC Managed Care – PPO | Source: Ambulatory Visit | Attending: Family Medicine | Admitting: Family Medicine

## 2020-04-02 ENCOUNTER — Ambulatory Visit (INDEPENDENT_AMBULATORY_CARE_PROVIDER_SITE_OTHER): Payer: BC Managed Care – PPO | Admitting: Psychology

## 2020-04-02 DIAGNOSIS — F4312 Post-traumatic stress disorder, chronic: Secondary | ICD-10-CM | POA: Diagnosis not present

## 2020-04-02 DIAGNOSIS — R0781 Pleurodynia: Secondary | ICD-10-CM

## 2020-04-02 DIAGNOSIS — R109 Unspecified abdominal pain: Secondary | ICD-10-CM

## 2020-04-02 DIAGNOSIS — R091 Pleurisy: Secondary | ICD-10-CM

## 2020-04-09 ENCOUNTER — Ambulatory Visit (INDEPENDENT_AMBULATORY_CARE_PROVIDER_SITE_OTHER): Payer: BC Managed Care – PPO | Admitting: Psychology

## 2020-04-09 DIAGNOSIS — F4481 Dissociative identity disorder: Secondary | ICD-10-CM | POA: Diagnosis not present

## 2020-04-10 ENCOUNTER — Ambulatory Visit (INDEPENDENT_AMBULATORY_CARE_PROVIDER_SITE_OTHER): Payer: BC Managed Care – PPO | Admitting: Psychology

## 2020-04-10 ENCOUNTER — Ambulatory Visit: Payer: BC Managed Care – PPO | Admitting: Psychology

## 2020-04-10 ENCOUNTER — Ambulatory Visit: Payer: BC Managed Care – PPO | Admitting: Cardiovascular Disease

## 2020-04-10 DIAGNOSIS — F4323 Adjustment disorder with mixed anxiety and depressed mood: Secondary | ICD-10-CM

## 2020-04-10 DIAGNOSIS — F4312 Post-traumatic stress disorder, chronic: Secondary | ICD-10-CM | POA: Diagnosis not present

## 2020-04-11 ENCOUNTER — Ambulatory Visit: Payer: BC Managed Care – PPO | Admitting: Neurology

## 2020-04-11 ENCOUNTER — Ambulatory Visit (INDEPENDENT_AMBULATORY_CARE_PROVIDER_SITE_OTHER): Payer: BC Managed Care – PPO | Admitting: Psychology

## 2020-04-11 DIAGNOSIS — F4312 Post-traumatic stress disorder, chronic: Secondary | ICD-10-CM

## 2020-04-16 ENCOUNTER — Ambulatory Visit (INDEPENDENT_AMBULATORY_CARE_PROVIDER_SITE_OTHER): Payer: BC Managed Care – PPO | Admitting: Psychology

## 2020-04-16 DIAGNOSIS — F4323 Adjustment disorder with mixed anxiety and depressed mood: Secondary | ICD-10-CM

## 2020-04-18 ENCOUNTER — Ambulatory Visit (INDEPENDENT_AMBULATORY_CARE_PROVIDER_SITE_OTHER): Payer: BC Managed Care – PPO | Admitting: Psychology

## 2020-04-18 DIAGNOSIS — F4323 Adjustment disorder with mixed anxiety and depressed mood: Secondary | ICD-10-CM | POA: Diagnosis not present

## 2020-04-23 ENCOUNTER — Ambulatory Visit (INDEPENDENT_AMBULATORY_CARE_PROVIDER_SITE_OTHER): Payer: BC Managed Care – PPO | Admitting: Psychology

## 2020-04-23 DIAGNOSIS — F4323 Adjustment disorder with mixed anxiety and depressed mood: Secondary | ICD-10-CM | POA: Diagnosis not present

## 2020-04-25 ENCOUNTER — Ambulatory Visit (INDEPENDENT_AMBULATORY_CARE_PROVIDER_SITE_OTHER): Payer: BC Managed Care – PPO | Admitting: Psychology

## 2020-04-25 DIAGNOSIS — F4323 Adjustment disorder with mixed anxiety and depressed mood: Secondary | ICD-10-CM

## 2020-04-27 ENCOUNTER — Ambulatory Visit (HOSPITAL_COMMUNITY): Payer: BC Managed Care – PPO | Attending: Internal Medicine

## 2020-04-27 ENCOUNTER — Other Ambulatory Visit: Payer: Self-pay

## 2020-04-27 DIAGNOSIS — I5032 Chronic diastolic (congestive) heart failure: Secondary | ICD-10-CM | POA: Diagnosis not present

## 2020-04-27 DIAGNOSIS — R0602 Shortness of breath: Secondary | ICD-10-CM | POA: Diagnosis present

## 2020-04-28 LAB — ECHOCARDIOGRAM COMPLETE
Area-P 1/2: 3.6 cm2
S' Lateral: 3.1 cm

## 2020-04-30 ENCOUNTER — Ambulatory Visit: Payer: BC Managed Care – PPO | Attending: Physical Medicine & Rehabilitation

## 2020-04-30 ENCOUNTER — Other Ambulatory Visit: Payer: Self-pay

## 2020-04-30 ENCOUNTER — Ambulatory Visit: Payer: BC Managed Care – PPO | Admitting: Psychology

## 2020-04-30 DIAGNOSIS — R41841 Cognitive communication deficit: Secondary | ICD-10-CM | POA: Diagnosis present

## 2020-04-30 DIAGNOSIS — R4701 Aphasia: Secondary | ICD-10-CM | POA: Diagnosis present

## 2020-04-30 NOTE — Patient Instructions (Signed)
    We will work on some compensations for your attention and concentration to make communication easier   Making audio notes/voice memos, or written notes in conversations might help your recall. Date them appropriately if you write them down.  Reducing distractions (TV, music, alerts on phone or watch, etc) during conversations will also help.   Pay attention to the person's mouth - when there's no mask (!).  Have conversations when you are "fresh" and not tired. You will retain more.

## 2020-05-01 ENCOUNTER — Ambulatory Visit: Payer: BC Managed Care – PPO | Admitting: Psychology

## 2020-05-01 NOTE — Therapy (Signed)
[facility] [address] [place], [place], [address] Phone: [phone]   Fax:  [phone]  Speech Language Pathology Evaluation  Patient Details  Name: Jesse Bowers MRN: [id number] Date of Birth: [birth year] Referring Provider (SLP): Alger Simons, MD   Encounter Date: 04/30/2020   End of Session - 05/01/20 0916    Visit Number 1    Number of Visits 12    Date for SLP Re-Evaluation 07/29/20    SLP Start Time 1150    SLP Stop Time  1233    SLP Time Calculation (min) 43 min    Activity Tolerance Patient tolerated treatment well           Past Medical History:  Diagnosis Date  . Abnormal weight loss   . Anxiety   . Arthritis   . Cataract    OU  . Celiac disease   . Cervical neck pain with evidence of disc disease    patient has a cyst   . Chronic constipation   . Chronic diastolic heart failure ([place])    Pt. denies  . Chronic pain   . Degenerative disc disease at L5-S1 level    with stenosis  . DVT (deep venous thrombosis) (HCC)    Right upper arm, bilateral leg  . Eczema    inguinal, feet  . Elevated liver enzymes   . 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 Family history of adverse reaction to anesthesia    family has problems with anesthesia of nausea and vomiting   . 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 GERD (gastroesophageal reflux disease)    History of in 20's  . Gluten enteropathy   . H/O parotitis    right   . Hard of hearing   . History of kidney stones   . History of retinal tear    Bilateral  . History of staph infection    required wound vac  . Hx-TIA (transient ischemic attack)    2015  . LVH (left ventricular hypertrophy) 12/15/2016   Mild, noted on ECHO  . MVP (mitral valve prolapse)   . NAFL (nonalcoholic fatty liver)   . Neck pain   . Neuromuscular disorder ([place])    bilateral neuropathy feet.  . Pneumonia 12/17/2010  . Polycythemia   . Polycythemia,  secondary   . PONV (postoperative nausea and vomiting)   . Protein C deficiency ([place])    Dr. Anne Fu  . Psoriasis    16 X10 cm psoriatic rash on sole of left foot ; open and occ scant bleeding;   . psoriatic arthritis   . PTSD (post-traumatic stress disorder)   . Scaphoid fracture of wrist 09/23/2013  . Seizure ([place])    childhood, medication until age 49 then weanned completely off  . Sleep apnea    split night study last done by Dr. [name] 06/18/15 shows severe OSA, CSA, and hypersomnia, rec bipap  . Splenomegaly   . Stenosis of ureteropelvic junction (UPJ)    left  . Stroke [facility])    CVA vs TIA in left cerebrum causing slight right sided weakness-Dr. [name] follows  . Syrinx of spinal cord ([place]) 01/06/2014   c spine on MRI  . Tachycardia    hx of   . Transfusion history    past history- none recent, after surgeries due to blood loss  . Wears glasses   . Wears hearing aid     Past Surgical History:  Procedure Laterality Date  .  ABDOMINAL HYSTERECTOMY Bilateral 1994   TAH, BSO- tranverse incision at 55 yo  . ANKLE ARTHROSCOPY WITH RECONSTRUCTION Right 2007  . CHOLECYSTECTOMY     laparoscopic  . COLONOSCOPY     x3  . EYE SURGERY     Left eye 03/02/2018, right 02/15/2018  . HIP ARTHROSCOPY W/ LABRAL REPAIR Right 05/11/2013   acetabular labral tear 03/30/2013  . KNEE ARTHROPLASTY Right   . KNEE JOINT MANIPULATION Left    x3 under anesthesia  . KNEE SURGERY Bilateral 1984   Right ACL, left PCL repair  . LITHOTRIPSY  2005  . LIVER BIOPSY  2013   normal results.  [name] MASTECTOMY Bilateral    prior to 2009  . MOUTH SURGERY    . NASAL SEPTUM SURGERY N/A 09/20/2015   by ENT Dr. [name]  . OVARIAN CYST SURGERY Left    size of grapefruit, was informed that she had shortened vagina  . SHOULDER SURGERY Bilateral    Right 08/15/2016, Left 11/15/2016  . THUMB ARTHROSCOPY Left   . THYROIDECTOMY, PARTIAL Left 2008  . TOTAL KNEE ARTHROPLASTY Right 08/23/2018   Procedure: TOTAL KNEE  ARTHROPLASTY;  Surgeon: [name], MD;  Location: [facility];  Service: Orthopedics;  Laterality: Right;  24mn  . TOTAL KNEE REVISION Left 02/06/2016   Procedure: LEFT TOTAL KNEE REVISION;  Surgeon: FGaynelle Arabian MD;  Location: [facility];  Service: Orthopedics;  Laterality: Left;  . TOTAL KNEE REVISION Left 04/22/2017   Procedure: Left knee polyethylene revision;  Surgeon: AGaynelle Arabian MD;  Location: [facility];  Service: Orthopedics;  Laterality: Left;  . UPPER GI ENDOSCOPY  2003    There were no vitals filed for this visit.   Subjective Assessment - 05/01/20 0852    Subjective (mild halting speech) "Now (after concussion) it seems like I go off on tangents and it makes people upset."    Currently in Pain? No/denies              [facility]     SLP Visit Information   SLP Received On 04/30/20    Referring Provider (SLP) SAlger Simons MD    Onset Date 2015    Medical Diagnosis Aphasia      Subjective   Patient/Family Stated Goal improve ability to communicate without threat of making people frustrated      General Information   [place] "I found some things online and my wife helped me with it too." Residual expressive aphasia with mild halting due to word finding/sentence construction. Neuropsych 2016- decr'd processing speed, generative naming, mental flexibility, and problem solving. Concussion in 2019 with a fall in which pt states afterwards it is more difficult for him to understand longer verbal discourse/conversation. Also complains of "making people upset because I start talking and I don't stop. 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    Vocation Other (Comment)  applying for/applied for disability     Cognition   Overall Cognitive Status Impaired/Different from baseline    Area of Impairment Problem solving;Following commands;Attention;Memory    Current Attention Level Selective    Memory Comments likely partly related to decr'd attention    Following Commands Follows multi-step commands inconsistently;Follows multi-step commands with increased time    Problem Solving Slow processing   including  decr'd auditory comprehension     Auditory Comprehension   Overall Auditory Comprehension Impaired at baseline    Yes/No Questions Within Functional Limits    Commands Impaired    One Step Basic Commands --   100%   Two Step Basic Commands --   100%   Multistep Basic Commands --   repeats necessary rarely   Complex Commands 75-100% accurate   repeats necessary occasionally   Conversation Complex    Interfering Components Attention;Processing speed;Working Research officer, trade [employer];Slowed speech;Extra processing time   pt reports people do not do this regularly for him   Overall Auditory Comprehension Comments Martavius tells SLP that longer discourse (conversation, TV shows, longer verbal directions) cause him difficulty in comprehension. "There are multiple times a day I have to ask someone to repeat what they've said." On story recall from [facility] pt answered 5/6 simple yes/no questions correctly, and demonstrated understanding of 7/18 details from the story.      Reading Comprehension   Reading Status Impaired    Interfering Components Attention    Effective Techniques --   re-reading multiple times     Verbal Expression   Overall Verbal Expression Impaired at baseline    Level of Generative/Spontaneous Verbalization  Conversation    Naming Impairment    Other Naming Comments pt named average 16 items in common/simple category, and generated average 7 items from a given letter (m, f). These are both below the mean for pt's age.    Pragmatics Impairment   mildly overly verbose during eval   Impairments Topic maintenance;Topic appropriateness    Interfering Components Attention;Premorbid deficit    Other Verbal Expression Comments Pt's speech quality is mildly halting - evidence of awareness of sentence construction/semantic defict. Pt's communication QOL today was scored as 12/40 (lower scores = worse QOL).      Motor Speech   Overall Motor Speech Appears within functional limits for tasks assessed                           SLP Education - 05/01/20 0915    Education Details defict areas, possible goals, rationale for frequency/duration    Person(s) Educated Patient    Methods Explanation;Handout    Comprehension Need further instruction;Verbalized understanding              SLP Long Term Goals - 05/01/20 0947      SLP LONG TERM GOAL #1   Title Verbalize daily schedule, recent events and salient information using external memory aids    Time 8    Period Weeks   or 9 total visits, for all LTGs     SLP LONG TERM GOAL #2   Title pt will demonstrate understanding of 10 minutes of conversational discourse or other auditory stimuli using compensatory techniques in 3 sessions    Time 8    Period Weeks    Status New      SLP LONG TERM GOAL #3   Title pt will demonstrate understanding/report understanding of written cues to perform household  duties and tasks with 100% accuracy x2 sessions    Time 8    Period Weeks    Status New      SLP LONG TERM GOAL #4   Title pt will demonstrate appropriate topic maintenance in 10 minutes conversation, with compensatory measures x3 sessions    Time 8    Period Weeks    Status New      SLP LONG TERM GOAL #5   Title pt's score on QOL  measure for communication will increase to >18    Time 8    Period Weeks    Status New            Plan - 05/01/20 5631    Clinical Impression Statement Arhan presents today with evidence of receptive and expressive language deficits from a lt CVA in 2015. Neuropsych testing (2016) also states there are some cogntive deficits stemming from that CVA. After a fall in 2019 Jesse Bowers tells SLP he has more difficulty understanding what people are saying to him in longer discourse/conversation. Additionally he has to read and re-read things to comprehend. He tells SLP that these language and cognitive deficits are putting a strain on his close relationships. At this time SLP will focus mainly on compensations but will also provide pt some language-based therapy techniques (VNeST and Semantic feature analysis) that he can perform at home for further assistance.    Speech Therapy Frequency 1x /week    Duration 8 weeks   or 9 total sessions   Treatment/Interventions Compensatory techniques;Functional tasks;Environmental controls;Multimodal communcation approach;Language facilitation;SLP instruction and feedback;Patient/family education;Internal/external aids;Cognitive reorganization    Potential to Achieve Goals Good    Potential Considerations Severity of impairments;Other (comment)   time post onset   Consulted and Agree with Plan of Care Patient           Patient will benefit from skilled therapeutic intervention in order to improve the following deficits and impairments:   Aphasia  Cognitive communication deficit    Problem List Patient Active Problem List   Diagnosis Date Noted  . Lattice degeneration of peripheral retina, left 09/08/2019  . Lattice degeneration, right eye 09/08/2019  . Nuclear sclerotic cataract of both eyes 09/08/2019  . Chronic diastolic heart failure ([place]) 05/24/2019  . Urinary dysfunction 04/12/2019  . Osteoarthritis of right knee 08/23/2018  . Constipation due to  opioid therapy 03/30/2018  . Retinopathy of both eyes 01/06/2018  . SNHL (sensorineural hearing loss) 12/04/2017  . Abnormal urinary stream 12/03/2017  . Osteoarthritis of carpometacarpal (CMC) joint of thumb 11/30/2017  . Muscle weakness 11/17/2017  . Transient vision disturbance 11/12/2017  . Bilateral hand pain 10/30/2017  . Pain of left hip joint 10/09/2017  . Gynecomastia 07/10/2017  . Iron deficiency anemia 07/05/2017  . Spasticity 05/20/2017  . Lumbar radiculitis 04/20/2017  . Ulnar 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, left 11/28/2016  . Idiopathic peripheral neuropathy 11/28/2016  . 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 Long term (current) use of anticoagulants 08/23/2015  . Right upper quadrant abdominal pain 08/23/2015  . Memory loss 05/10/2015  . Gait abnormality 04/07/2015  . Alkaline phosphatase elevation 04/07/2015  . History of thrombosis 03/26/2015  . Medial epicondylitis 02/07/2015  . Cognitive decline 12/21/2014  . Leukopenia 12/05/2014  . Rotator cuff syndrome of right shoulder 10/27/2014  . Status post left knee replacement 08/22/2014  . Left lateral epicondylitis 08/22/2014  . Chronic cerebral ischemia 08/18/2014  . Arthrofibrosis of knee joint 08/17/2014  . Cubital canal compression syndrome, left  08/17/2014  . 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 Chronic pain syndrome 04/10/2014  . Insomnia 04/10/2014  . Chronic non-specific white matter lesions on MRI 04/10/2014  . CFS (chronic fatigue syndrome) 04/10/2014  . 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 Biceps tendonitis on left 03/01/2014  . Polycythemia vera ([place]) 12/27/2013  . H/O TIA (transient ischemic attack) and stroke 12/27/2013  . Neck pain 12/27/2013  . OSA (obstructive sleep apnea) 12/08/2013  . 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 Depression with anxiety 08/04/2013  . Protein C deficiency ([place]) 08/01/2013  . Post traumatic stress disorder (PTSD) 08/01/2013  . Speech abnormality  07/25/2013  . Obesity 05/10/2013  . Lower extremity edema 05/10/2013  . GERD (gastroesophageal reflux disease) 05/10/2013  . Arthritis 05/10/2013  . OA (osteoarthritis) of knee 03/15/2013  . Headache 10/25/2012  . Palpitations 10/18/2012  . Fatty liver determined by biopsy 06/01/2012  . Arthropathic psoriasis, unspecified ([place]) 04/29/2012  . Abnormal liver enzymes 03/29/2012  . Psoriatic arthritis ([place]) 12/29/2011  . Left Renal Hydronephrosis 12/11/2010  . 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 Celiac disease 05/27/2010  . Thyroid nodule 05/27/2010  . Male-to-male transgender person 09/20/2002    [facility] ,[place], [place]  05/01/2020, 9:34 AM  [facility] [address] [place], [place], [address] Phone: [phone]   Fax:  [phone]  Name: Jesse Bowers MRN: [id number] Date of Birth: 04/16/1965

## 2020-05-02 ENCOUNTER — Ambulatory Visit (INDEPENDENT_AMBULATORY_CARE_PROVIDER_SITE_OTHER): Payer: BC Managed Care – PPO | Admitting: Psychology

## 2020-05-02 DIAGNOSIS — F4323 Adjustment disorder with mixed anxiety and depressed mood: Secondary | ICD-10-CM

## 2020-05-03 ENCOUNTER — Ambulatory Visit (INDEPENDENT_AMBULATORY_CARE_PROVIDER_SITE_OTHER): Payer: BC Managed Care – PPO | Admitting: Psychology

## 2020-05-03 DIAGNOSIS — F4323 Adjustment disorder with mixed anxiety and depressed mood: Secondary | ICD-10-CM

## 2020-05-07 ENCOUNTER — Other Ambulatory Visit: Payer: Self-pay

## 2020-05-07 ENCOUNTER — Ambulatory Visit (INDEPENDENT_AMBULATORY_CARE_PROVIDER_SITE_OTHER): Payer: BC Managed Care – PPO | Admitting: Ophthalmology

## 2020-05-07 ENCOUNTER — Encounter (INDEPENDENT_AMBULATORY_CARE_PROVIDER_SITE_OTHER): Payer: Self-pay | Admitting: Ophthalmology

## 2020-05-07 ENCOUNTER — Ambulatory Visit (INDEPENDENT_AMBULATORY_CARE_PROVIDER_SITE_OTHER): Payer: BC Managed Care – PPO | Admitting: Psychology

## 2020-05-07 DIAGNOSIS — G4733 Obstructive sleep apnea (adult) (pediatric): Secondary | ICD-10-CM | POA: Diagnosis not present

## 2020-05-07 DIAGNOSIS — F4323 Adjustment disorder with mixed anxiety and depressed mood: Secondary | ICD-10-CM

## 2020-05-07 DIAGNOSIS — H35411 Lattice degeneration of retina, right eye: Secondary | ICD-10-CM | POA: Diagnosis not present

## 2020-05-07 DIAGNOSIS — H35073 Retinal telangiectasis, bilateral: Secondary | ICD-10-CM

## 2020-05-07 DIAGNOSIS — H35412 Lattice degeneration of retina, left eye: Secondary | ICD-10-CM

## 2020-05-07 DIAGNOSIS — Z961 Presence of intraocular lens: Secondary | ICD-10-CM

## 2020-05-07 NOTE — Progress Notes (Signed)
05/07/2020     CHIEF COMPLAINT Patient presents for Retina Follow Up (7 Month f\u OU. FP/Pt c/o OU being blurry constantly. Pt had Cat SX done by Dr. Wyatt Portela. Pt states vision is more blurry now than before the sx. Pt sees floaters but denies FOL. Using rewetting gtts for dry eyes.)   HISTORY OF PRESENT ILLNESS: Jesse Bowers is a 55 y.o. 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  Severity is moderate.  Duration of 7 months.  Since onset it is stable.  I, the attending physician,  performed the HPI with the patient and updated documentation appropriately. Additional comments: 7 Month f\u OU. FP Pt c/o OU being blurry constantly. Pt had Cat SX done by Dr. Wyatt Portela. Pt states vision is more blurry now than before the sx. Pt sees floaters but denies FOL. Using rewetting gtts for dry eyes.       Last edited by Tilda Franco on 05/07/2020  3:02 PM. (History)      Referring physician: Shawnee Knapp, MD [place],   [id number]  HISTORICAL INFORMATION:   Selected notes from the MEDICAL RECORD NUMBER       CURRENT MEDICATIONS: No current outpatient medications on file. (Ophthalmic Drugs)   No current facility-administered medications for this visit. (Ophthalmic Drugs)   Current Outpatient Medications (Other)  Medication Sig  . acyclovir cream (ZOVIRAX) 5 % Apply topically.  . baclofen (LIORESAL) 20 MG tablet Take 1 tablet (20 mg total) by mouth 4 (four) times daily.  . Calcipotriene-Betameth Diprop 0.005-0.064 % FOAM Apply 1 application topically 2 (two) times daily as needed.   . ciprofloxacin-dexamethasone (CIPRODEX) OTIC suspension   . clindamycin (CLEOCIN) 300 MG capsule Take 300 mg by mouth 3 (three) times daily.  . clobetasol (TEMOVATE) 0.05 % GEL Apply 1 application topically 2 (two) times daily as needed (itching).  . clorazepate (TRANXENE) 15 MG tablet Take 15 mg by mouth  at bedtime.  . clorazepate (TRANXENE) 7.5 MG tablet Take 7.5 mg by mouth 2 (two) times daily.  . COSENTYX SENSOREADY, 300 MG, 150 MG/ML SOAJ Inject 2 Syringes into the skin every 28 (twenty-eight) days.  [name] desonide (DESOWEN) 0.05 % ointment Apply 1 application topically 2 (two) times daily as needed (psoriasis).   [name] diclofenac sodium (VOLTAREN) 1 % GEL APPLY TOPICALLY TO BOTH HANDS 3 TIMES DAILY. (Patient taking differently: Apply 1 application topically 3 (three) times daily as needed.)  . ergocalciferol (VITAMIN D2) 1.25 MG (50000 UT) capsule Take 1 capsule by mouth once a week.  . Fluocinolone Acetonide 0.01 % OIL Place 3 drops into both ears 2 (two) times daily as needed for itching.  . folic acid (FOLVITE) 1 MG tablet Take 1 mg by mouth daily.  . furosemide (LASIX) 20 MG tablet TAKE 1 TABLET BY MOUTH DAILY. TO BE TAKEN WITH THE 40 MG TO EQUAL 60 MG DAILY  . furosemide (LASIX) 40 MG tablet TAKE 1 TABLET BY MOUTH DAILY. TO BE TAKEN WITH THE 20 MG TO EQUAL 60 MG DAILY.  [name] linaclotide (LINZESS) 145 MCG CAPS capsule Take 1 capsule (145 mcg total) by mouth daily before breakfast.  . methotrexate (RHEUMATREX) 2.5 MG tablet Take 10 mg by mouth once a week. Caution:Chemotherapy. Protect from light.  . morphine (KADIAN) 60 MG 24 hr capsule Take 1 capsule (60 mg total) by mouth  every 12 (twelve) hours.  [name] neomycin-polymyxin-hydrocortisone (CORTISPORIN) 3.5-10000-1 OTIC suspension Place 4 drops into both ears 2 (two) times daily as needed (ear pain).   . Oxycodone HCl 10 MG TABS Take 1 tablet (10 mg total) by mouth every 8 (eight) hours as needed.  . potassium chloride (KLOR-CON) 10 MEQ tablet TAKE 2 TABLETS BY MOUTH EVERY DAY  . rivaroxaban (XARELTO) 20 MG TABS tablet TAKE 1 TABLET(20 MG) BY MOUTH EVERY NIGHT AT BEDTIME  . Secukinumab 150 MG/ML SOAJ Inject 300 mg into the skin every 28 (twenty-eight) days.   [name] testosterone cypionate (DEPOTESTOSTERONE CYPIONATE) 200 MG/ML injection INJECT 0.5 MLS (100 MG  TOTAL) INTO THE MUSCLE ONCE A WEEK.  . ursodiol (ACTIGALL) 250 MG tablet Take 250 mg by mouth 2 (two) times daily with a meal.   . ursodiol (ACTIGALL) 500 MG tablet Take 500 mg by mouth 2 (two) times daily with a meal.   . ursodiol (ACTIGALL) 500 MG tablet Take by mouth.  . venlafaxine XR (EFFEXOR-XR) 150 MG 24 hr capsule Take 2 capsules (300 mg total) by mouth daily with breakfast.  . Vitamin D, Ergocalciferol, (DRISDOL) 1.25 MG (50000 UNIT) CAPS capsule Take 50,000 Units by mouth once a week.   No current facility-administered medications for this visit. (Other)      REVIEW OF SYSTEMS:    ALLERGIES Allergies  Allergen Reactions  . Penicillin G Anaphylaxis and Other (See Comments)    Has patient had a PCN reaction causing immediate rash, facial/tongue/throat swelling, SOB or lightheadedness with hypotension: Yes Has patient had a PCN reaction causing severe rash involving mucus membranes or skin necrosis: No Has patient had a PCN reaction that required hospitalization Yes Has patient had a PCN reaction occurring within the last 10 years: No If all of the above answers are "NO", then may proceed with Cephalosporin use.   . Sulfa Antibiotics Rash    Stevens-Johnson rash  . Tegaderm Ag Mesh [Silver] Dermatitis    Causes blistering wounds   . Ibuprofen Other (See Comments)    Contraindicated with Xarelto.   . Pregabalin Other (See Comments)    Ineffective  . Sulfacetamide Sodium-Sulfur Rash    PAST MEDICAL HISTORY Past Medical History:  Diagnosis Date  . Abnormal weight loss   . Anxiety   . Arthritis   . Cataract    OU  . Celiac disease   . Cervical neck pain with evidence of disc disease    patient has a cyst   . Chronic constipation   . Chronic diastolic heart failure ([place])    Pt. denies  . Chronic pain   . Degenerative disc disease at L5-S1 level    with stenosis  . DVT (deep venous thrombosis) ([facility])    Right upper arm, bilateral leg  . Eczema    inguinal,  feet  . Elevated liver enzymes   . 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 Family history of adverse reaction to anesthesia    family has problems with anesthesia of nausea and vomiting   . 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 GERD (gastroesophageal reflux disease)    History of in 20's  . Gluten enteropathy   . H/O parotitis    right   . Hard of hearing   . History of kidney stones   . History of retinal tear    Bilateral  . History of staph infection    required wound vac  . Hx-TIA (transient ischemic attack)    2015  .  LVH (left ventricular hypertrophy) 12/15/2016   Mild, noted on ECHO  . MVP (mitral valve prolapse)   . NAFL (nonalcoholic fatty liver)   . Neck pain   . Neuromuscular disorder ([place])    bilateral neuropathy feet.  . Nuclear sclerotic cataract of both eyes 09/08/2019  . Pneumonia 12/17/2010  . Polycythemia   . Polycythemia, secondary   . PONV (postoperative nausea and vomiting)   . Protein C deficiency ([place])    Dr. Anne Fu  . Psoriasis    16 X10 cm psoriatic rash on sole of left foot ; open and occ scant bleeding;   . psoriatic arthritis   . PTSD (post-traumatic stress disorder)   . Scaphoid fracture of wrist 09/23/2013  . Seizure ([place])    childhood, medication until age 73 then weanned completely off  . Sleep apnea    split night study last done by Dr. [name] 06/18/15 shows severe OSA, CSA, and hypersomnia, rec bipap  . Splenomegaly   . Stenosis of ureteropelvic junction (UPJ)    left  . 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 [name] follows  . Syrinx of spinal cord ([place]) 01/06/2014   c spine on MRI  . Tachycardia    hx of   . Transfusion history    past history- none recent, after surgeries due to blood loss  . Wears glasses   . Wears hearing aid    Past Surgical History:  Procedure Laterality Date  . ABDOMINAL HYSTERECTOMY Bilateral 1994   TAH, BSO- tranverse incision at 55 yo  . ANKLE  ARTHROSCOPY WITH RECONSTRUCTION Right 2007  . CHOLECYSTECTOMY     laparoscopic  . COLONOSCOPY     x3  . EYE SURGERY     Left eye 03/02/2018, right 02/15/2018  . HIP ARTHROSCOPY W/ LABRAL REPAIR Right 05/11/2013   acetabular labral tear 03/30/2013  . KNEE ARTHROPLASTY Right   . KNEE JOINT MANIPULATION Left    x3 under anesthesia  . KNEE SURGERY Bilateral 1984   Right ACL, left PCL repair  . LITHOTRIPSY  2005  . LIVER BIOPSY  2013   normal results.  [name] MASTECTOMY Bilateral    prior to 2009  . MOUTH SURGERY    . NASAL SEPTUM SURGERY N/A 09/20/2015   by ENT Dr. [name]  . OVARIAN CYST SURGERY Left    size of grapefruit, was informed that she had shortened vagina  . SHOULDER SURGERY Bilateral    Right 08/15/2016, Left 11/15/2016  . THUMB ARTHROSCOPY Left   . THYROIDECTOMY, PARTIAL Left 2008  . TOTAL KNEE ARTHROPLASTY Right 08/23/2018   Procedure: TOTAL KNEE ARTHROPLASTY;  Surgeon: [name], MD;  Location: [facility];  Service: Orthopedics;  Laterality: Right;  10mn  . TOTAL KNEE REVISION Left 02/06/2016   Procedure: LEFT TOTAL KNEE REVISION;  Surgeon: FGaynelle Arabian MD;  Location: [facility];  Service: Orthopedics;  Laterality: Left;  . TOTAL KNEE REVISION Left 04/22/2017   Procedure: Left knee polyethylene revision;  Surgeon: AGaynelle Arabian MD;  Location: [facility];  Service: Orthopedics;  Laterality: Left;  . UPPER GI ENDOSCOPY  2003    FAMILY HISTORY Family History  Problem Relation Age of Onset  . Stroke Maternal Grandfather        571 . Heart attack Maternal Grandfather   . Glaucoma Maternal Grandfather   . Macular degeneration Maternal Grandfather   . Breast cancer Sister   . Hypertension Mother   .  Psoriasis Mother   . Other Mother        meningioma developed ~2019  . Glaucoma Mother   . Cancer Paternal Grandfather   . Heart attack Paternal Grandfather   . Stroke Paternal Uncle        age 32  . Polycythemia Paternal Uncle   . Stroke Maternal Grandmother   . Congestive  Heart Failure Maternal Grandmother   . Heart attack Maternal Grandmother   . Protein C deficiency Sister 53       Miscarriages  . Breast cancer Maternal Aunt 31    SOCIAL HISTORY Social History   Tobacco Use  . Smoking status: Never Smoker  . Smokeless tobacco: Never Used  Vaping Use  . Vaping Use: Never used  Substance Use Topics  . Alcohol use: Yes    Comment: social  . Drug use: No         OPHTHALMIC EXAM:  Base Eye Exam    Visual Acuity (Snellen - Linear)      Right Left   Dist [place] 20/30 -1 20/30 -1       Tonometry (Tonopen, 3:05 PM)      Right Left   Pressure 12 12       Pupils      Pupils Dark Light Shape React APD   Right PERRL 3 2 Round Brisk None   Left PERRL 3 2 Round Brisk None       Neuro/Psych    Oriented x3: Yes   Mood/Affect: Normal       Dilation    Both eyes: 1.0% Mydriacyl, 2.5% Phenylephrine @ 3:05 PM        Slit Lamp and Fundus Exam    External Exam      Right Left   External Normal Normal       Slit Lamp Exam      Right Left   Lids/Lashes Normal Normal   Conjunctiva/Sclera White and quiet White and quiet   Cornea Clear Clear   Anterior Chamber Deep and quiet Deep and quiet   Iris Round and reactive Round and reactive   Lens Centered posterior chamber intraocular lens Centered posterior chamber intraocular lens   Anterior Vitreous Normal Normal       Fundus Exam      Right Left   Posterior Vitreous Normal Normal   Disc Normal Normal   C/D Ratio 0.2 0.2   Macula Normal Normal   Vessels Normal Normal   Periphery Lattice generation superonasal, inferonasal and inferotemporal each with good laser photocoagulation reaction. Patient inferotemporal no treatment needed at 530, pexy around lattice degeneration superotemporal.          IMAGING AND PROCEDURES  Imaging and Procedures for 05/07/20  Color Fundus Photography Optos - OU - Both Eyes       Right Eye Progression has improved. Disc findings include normal  observations. Macula : normal observations. Vessels : normal observations. Periphery : normal observations.   Left Eye Progression has improved. Disc findings include normal observations. Macula : normal observations. Vessels : normal observations. Periphery : normal observations.   Notes Clear media OU.  Good retinopexy inferotemporal OU and regions of previous lattice degeneration, no holes or tears       OCT, Retina - OU - Both Eyes       Right Eye Quality was good. Scan locations included subfoveal. Central Foveal Thickness: 295. Progression has been stable.   Left Eye Quality was good. Scan locations included subfoveal.  Central Foveal Thickness: 294. Progression has been stable.   Notes OU slightly thicker maculae as compared to last OCT December 2019 however no overt CME  Very minor micro-CME may be seen in the inner polar perifoveal regions but these are not pathologic                ASSESSMENT/PLAN:  OSA (obstructive sleep apnea) I will review with patient my impressions about the effects of untreated sleep apnea as the patient now has been without a machine, off BiPAP for 7 months since his machine broke and with equipment acquisition issues,  supply chain  Patient does report that he has now been on the BiPAP only for 1 week as he just returned a bit before the month.  He was using it intermittently anyway    This note is being dictated prior to having an OCT performed today in the office.  So the patient is having an OCT in the office right now  Pseudophakia of both eyes No capsule opacity in either eye  Retinal telangiectasis of both eyes No obvious CME detectable on clinical examination nor on OCT nonetheless macular telangiectasis can be present can have early minor visual blurring and I will propose and push the patient resume nightly BiPAP use maximizing his oxygenation to the optic nerve and macular region  This will occur over the next 2 weeks and  we will asked the patient return for follow-up as he monitors whether or not his symptoms are improving or stabilized      ICD-10-CM   1. Retinal telangiectasis of both eyes  H35.073 OCT, Retina - OU - Both Eyes  2. Lattice degeneration of peripheral retina, left  H35.412 Color Fundus Photography Optos - OU - Both Eyes  3. Lattice degeneration, right eye  H35.411 Color Fundus Photography Optos - OU - Both Eyes  4. OSA (obstructive sleep apnea)  G47.33   5. Pseudophakia of both eyes  Z96.1     1.  2.  3.  Ophthalmic Meds Ordered this visit:  No orders of the defined types were placed in this encounter.      Return in about 2 weeks (around 05/21/2020) for DILATE OU, OCT, patient may stay in wheelchair.  There are no Patient Instructions on file for this visit.   Explained the diagnoses, plan, and follow up with the patient and they expressed understanding.  Patient expressed understanding of the importance of proper follow up care.   Clent Demark Rankin M.D. Diseases & Surgery of the Retina and Vitreous Retina & Diabetic [place] 05/07/20     Abbreviations: M myopia (nearsighted); A astigmatism; H hyperopia (farsighted); P presbyopia; Mrx spectacle prescription;  CTL contact lenses; OD right eye; OS left eye; OU both eyes  XT exotropia; ET esotropia; PEK punctate epithelial keratitis; PEE punctate epithelial erosions; DES dry eye syndrome; MGD meibomian gland dysfunction; ATs artificial tears; PFAT's preservative free artificial tears; [place] nuclear sclerotic cataract; PSC posterior subcapsular cataract; ERM epi-retinal membrane; PVD posterior vitreous detachment; RD retinal detachment; DM diabetes mellitus; DR diabetic retinopathy; NPDR non-proliferative diabetic retinopathy; PDR proliferative diabetic retinopathy; CSME clinically significant macular edema; DME diabetic macular edema; dbh dot blot hemorrhages; CWS cotton wool spot; POAG primary open angle glaucoma; C/D cup-to-disc  ratio; HVF humphrey visual field; GVF goldmann visual field; OCT optical coherence tomography; IOP intraocular pressure; BRVO Branch retinal vein occlusion; CRVO central retinal vein occlusion; CRAO central retinal artery occlusion; BRAO branch retinal artery occlusion; RT retinal tear; SB  scleral buckle; PPV pars plana vitrectomy; VH Vitreous hemorrhage; PRP panretinal laser photocoagulation; IVK intravitreal kenalog; VMT vitreomacular traction; MH Macular hole;  NVD neovascularization of the disc; NVE neovascularization elsewhere; AREDS age related eye disease study; ARMD age related macular degeneration; POAG primary open angle glaucoma; EBMD epithelial/anterior basement membrane dystrophy; ACIOL anterior chamber intraocular lens; IOL intraocular lens; PCIOL posterior chamber intraocular lens; Phaco/IOL phacoemulsification with intraocular lens placement; [place] photorefractive keratectomy; LASIK laser assisted in situ keratomileusis; HTN hypertension; DM diabetes mellitus; COPD chronic obstructive pulmonary disease

## 2020-05-07 NOTE — Assessment & Plan Note (Addendum)
I will review with patient my impressions about the effects of untreated sleep apnea as the patient now has been without a machine, off BiPAP for 7 months since his machine broke and with equipment acquisition issues,  supply chain  Patient does report that he has now been on the BiPAP only for 1 week as he just returned a bit before the month.  He was using it intermittently anyway    This note is being dictated prior to having an OCT performed today in the office.  So the patient is having an OCT in the office right now

## 2020-05-07 NOTE — Assessment & Plan Note (Signed)
No capsule opacity in either eye

## 2020-05-07 NOTE — Assessment & Plan Note (Signed)
No obvious CME detectable on clinical examination nor on OCT nonetheless macular telangiectasis can be present can have early minor visual blurring and I will propose and push the patient resume nightly BiPAP use maximizing his oxygenation to the optic nerve and macular region  This will occur over the next 2 weeks and we will asked the patient return for follow-up as he monitors whether or not his symptoms are improving or stabilized

## 2020-05-08 ENCOUNTER — Ambulatory Visit: Payer: BC Managed Care – PPO | Attending: Physical Medicine & Rehabilitation

## 2020-05-08 ENCOUNTER — Ambulatory Visit (INDEPENDENT_AMBULATORY_CARE_PROVIDER_SITE_OTHER): Payer: BC Managed Care – PPO | Admitting: Psychology

## 2020-05-08 DIAGNOSIS — R4701 Aphasia: Secondary | ICD-10-CM | POA: Diagnosis not present

## 2020-05-08 DIAGNOSIS — F4312 Post-traumatic stress disorder, chronic: Secondary | ICD-10-CM

## 2020-05-08 DIAGNOSIS — R41841 Cognitive communication deficit: Secondary | ICD-10-CM

## 2020-05-08 DIAGNOSIS — F4323 Adjustment disorder with mixed anxiety and depressed mood: Secondary | ICD-10-CM

## 2020-05-08 NOTE — Therapy (Signed)
[facility] [address] [place], [place], [address] Phone: [phone]   Fax:  [phone]  Speech Language Pathology Treatment  Patient Details  Name: Jesse Bowers MRN: [id number] Date of Birth: 14-Aug-1965 Referring Provider (SLP): Alger Simons, MD   Encounter Date: 05/08/2020   End of Session - 05/08/20 1308    Visit Number 2    Number of Visits 12    Date for SLP Re-Evaluation 07/29/20    SLP Start Time 1150    SLP Stop Time  1232    SLP Time Calculation (min) 42 min    Activity Tolerance Patient tolerated treatment well           Past Medical History:  Diagnosis Date  . Abnormal weight loss   . Anxiety   . Arthritis   . Cataract    OU  . Celiac disease   . Cervical neck pain with evidence of disc disease    patient has a cyst   . Chronic constipation   . Chronic diastolic heart failure ([place])    Pt. denies  . Chronic pain   . Degenerative disc disease at L5-S1 level    with stenosis  . DVT (deep venous thrombosis) (HCC)    Right upper arm, bilateral leg  . Eczema    inguinal, feet  . Elevated liver enzymes   . 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 Family history of adverse reaction to anesthesia    family has problems with anesthesia of nausea and vomiting   . 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 GERD (gastroesophageal reflux disease)    History of in 20's  . Gluten enteropathy   . H/O parotitis    right   . Hard of hearing   . History of kidney stones   . History of retinal tear    Bilateral  . History of staph infection    required wound vac  . Hx-TIA (transient ischemic attack)    2015  . LVH (left ventricular hypertrophy) 12/15/2016   Mild, noted on ECHO  . MVP (mitral valve prolapse)   . NAFL (nonalcoholic fatty liver)   . Neck pain   . Neuromuscular disorder ([place])    bilateral neuropathy feet.  . Nuclear sclerotic cataract of both eyes 09/08/2019  . Pneumonia  12/17/2010  . Polycythemia   . Polycythemia, secondary   . PONV (postoperative nausea and vomiting)   . Protein C deficiency ([place])    Dr. Anne Fu  . Psoriasis    16 X10 cm psoriatic rash on sole of left foot ; open and occ scant bleeding;   . psoriatic arthritis   . PTSD (post-traumatic stress disorder)   . Scaphoid fracture of wrist 09/23/2013  . Seizure ([place])    childhood, medication until age 73 then weanned completely off  . Sleep apnea    split night study last done by Dr. [name] 06/18/15 shows severe OSA, CSA, and hypersomnia, rec bipap  . Splenomegaly   . Stenosis of ureteropelvic junction (UPJ)    left  . Stroke [facility])    CVA vs TIA in left cerebrum causing slight right sided weakness-Dr. [name] follows  . Syrinx of spinal cord ([place]) 01/06/2014   c spine on MRI  . Tachycardia    hx of   . Transfusion history    past history- none recent, after surgeries due to blood loss  . Wears glasses   . Wears hearing aid  Past Surgical History:  Procedure Laterality Date  . ABDOMINAL HYSTERECTOMY Bilateral 1994   TAH, BSO- tranverse incision at 55 yo  . ANKLE ARTHROSCOPY WITH RECONSTRUCTION Right 2007  . CHOLECYSTECTOMY     laparoscopic  . COLONOSCOPY     x3  . EYE SURGERY     Left eye 03/02/2018, right 02/15/2018  . HIP ARTHROSCOPY W/ LABRAL REPAIR Right 05/11/2013   acetabular labral tear 03/30/2013  . KNEE ARTHROPLASTY Right   . KNEE JOINT MANIPULATION Left    x3 under anesthesia  . KNEE SURGERY Bilateral 1984   Right ACL, left PCL repair  . LITHOTRIPSY  2005  . LIVER BIOPSY  2013   normal results.  [name] MASTECTOMY Bilateral    prior to 2009  . MOUTH SURGERY    . NASAL SEPTUM SURGERY N/A 09/20/2015   by ENT Dr. [name]  . OVARIAN CYST SURGERY Left    size of grapefruit, was informed that she had shortened vagina  . SHOULDER SURGERY Bilateral    Right 08/15/2016, Left 11/15/2016  . THUMB ARTHROSCOPY Left   . THYROIDECTOMY, PARTIAL Left 2008  . TOTAL KNEE  ARTHROPLASTY Right 08/23/2018   Procedure: TOTAL KNEE ARTHROPLASTY;  Surgeon: [name], MD;  Location: [facility];  Service: Orthopedics;  Laterality: Right;  40mn  . TOTAL KNEE REVISION Left 02/06/2016   Procedure: LEFT TOTAL KNEE REVISION;  Surgeon: FGaynelle Arabian MD;  Location: [facility];  Service: Orthopedics;  Laterality: Left;  . TOTAL KNEE REVISION Left 04/22/2017   Procedure: Left knee polyethylene revision;  Surgeon: AGaynelle Arabian MD;  Location: [facility];  Service: Orthopedics;  Laterality: Left;  . UPPER GI ENDOSCOPY  2003    There were no vitals filed for this visit.   Subjective Assessment - 05/08/20 1201    Currently in Pain? Yes    Pain Score 7     Pain Location Knee    Pain Orientation Right;Left    Pain Descriptors / Indicators Constant;Aching    Pain Type Chronic pain                 ADULT SLP TREATMENT - 05/08/20 1203      General Information   Behavior/Cognition Alert;Cooperative;Pleasant mood      Treatment Provided   Treatment provided Cognitive-Linquistic      Cognitive-Linquistic Treatment   Treatment focused on Cognition;Patient/family/caregiver education;Aphasia    Skilled Treatment Pt reports he frequently rambles in conversation and rephrases information over and over, which has negatively impacted his communication effectiveness with wife and family members. SLP targeted specific topics for short conversation, in which pt able to maintain topic with rare min A. Pt indicated he has limited awareness of non-verbal cues from family members when "rambling" occurs. SLP educated patient on establishing eye contact, timing self for short durations for practice with concluding sentence, and using attention/memory strategies to eliminate distractions and improve focus on conversation/tasks.      Assessment / Recommendations / Plan   Plan Continue with current plan of care      Progression Toward Goals   Progression toward goals Progressing toward goals             SLP Education - 05/08/20 1307    Education Details attention strategies, topic maintenance strategies, concluding sentences    Person(s) Educated Patient    Methods Explanation;Demonstration;Handout    Comprehension Verbalized understanding;Returned demonstration;Need further instruction              SLP Long Term Goals -  05/08/20 1218      SLP LONG TERM GOAL #1   Title Verbalize daily schedule, recent events and salient information using external memory aids    Time 8    Period Weeks   or 9 total visits, for all LTGs   Status On-going      SLP LONG TERM GOAL #2   Title pt will demonstrate understanding of 10 minutes of conversational discourse or other auditory stimuli using compensatory techniques in 3 sessions    Time 8    Period Weeks    Status On-going      SLP LONG TERM GOAL #3   Title pt will demonstrate understanding/report understanding of written cues to perform household duties and tasks with 100% accuracy x2 sessions    Time 8    Period Weeks    Status On-going      SLP LONG TERM GOAL #4   Title pt will demonstrate appropriate topic maintenance in 10 minutes conversation, with compensatory measures x3 sessions    Time 8    Period Weeks    Status On-going      SLP LONG TERM GOAL #5   Title pt's score on QOL measure for communication will increase to >18    Time 8    Period Weeks    Status On-going            Plan - 05/08/20 1308    Clinical Impression Statement Willey presents today with evidence of receptive and expressive language deficits from a lt CVA in 2015. Neuropsych testing (2016) also states there are some cogntive deficits stemming from that CVA. After a fall in 2019 Thien tells SLP he has more difficulty understanding what people are saying to him in longer discourse/conversation. Additionally he has to read and re-read things to comprehend. He tells SLP that these language and cognitive deficits are putting a strain on his close  relationships. QOL measure assessing pt's QOL with communicaiton was scored a 12/40 (lower scores = worse QOL). At this time SLP will focus mainly on compensations but will also provide pt some language-based therapy techniques (VNeST and Semantic feature analysis) that he can perform at home for further assistance. Pt will benefit from skiled ST targeting language and cognition necessary for effective communication.    Speech Therapy Frequency 1x /week    Duration 8 weeks    Treatment/Interventions Compensatory techniques;Functional tasks;Environmental controls;Multimodal communcation approach;Language facilitation;SLP instruction and feedback;Patient/family education;Internal/external aids;Cognitive reorganization    Potential Considerations Severity of impairments;Other (comment)    SLP Home Exercise Plan provided    Consulted and Agree with Plan of Care Patient           Patient will benefit from skilled therapeutic intervention in order to improve the following deficits and impairments:   Aphasia  Cognitive communication deficit    Problem List Patient Active Problem List   Diagnosis Date Noted  . Pseudophakia of both eyes 05/07/2020  . Retinal telangiectasis of both eyes 05/07/2020  . Lattice degeneration of peripheral retina, left 09/08/2019  . Lattice degeneration, right eye 09/08/2019  . Chronic diastolic heart failure ([place]) 05/24/2019  . Urinary dysfunction 04/12/2019  . Osteoarthritis of right knee 08/23/2018  . Constipation due to opioid therapy 03/30/2018  . Retinopathy of both eyes 01/06/2018  . SNHL (sensorineural hearing loss) 12/04/2017  . Abnormal urinary stream 12/03/2017  . Osteoarthritis of carpometacarpal (CMC) joint of thumb 11/30/2017  . Muscle weakness 11/17/2017  . Transient vision disturbance 11/12/2017  . Bilateral  hand pain 10/30/2017  . Pain of left hip joint 10/09/2017  . Gynecomastia 07/10/2017  . Iron deficiency anemia 07/05/2017  . Spasticity  05/20/2017  . Lumbar radiculitis 04/20/2017  . Ulnar 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, left 11/28/2016  . Idiopathic peripheral neuropathy 11/28/2016  . 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 Long term (current) use of anticoagulants 08/23/2015  . Right upper quadrant abdominal pain 08/23/2015  . Memory loss 05/10/2015  . Gait abnormality 04/07/2015  . Alkaline phosphatase elevation 04/07/2015  . History of thrombosis 03/26/2015  . Medial epicondylitis 02/07/2015  . Cognitive decline 12/21/2014  . Leukopenia 12/05/2014  . Rotator cuff syndrome of right shoulder 10/27/2014  . Status post left knee replacement 08/22/2014  . Left lateral epicondylitis 08/22/2014  . Chronic cerebral ischemia 08/18/2014  . Arthrofibrosis of knee joint 08/17/2014  . Cubital canal compression syndrome, left 08/17/2014  . 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 Chronic pain syndrome 04/10/2014  . Insomnia 04/10/2014  . Chronic non-specific white matter lesions on MRI 04/10/2014  . CFS (chronic fatigue syndrome) 04/10/2014  . 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 Biceps tendonitis on left 03/01/2014  . Polycythemia vera ([place]) 12/27/2013  . H/O TIA (transient ischemic attack) and stroke 12/27/2013  . Neck pain 12/27/2013  . OSA (obstructive sleep apnea) 12/08/2013  . 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 Depression with anxiety 08/04/2013  . Protein C deficiency ([place]) 08/01/2013  . Post traumatic stress disorder (PTSD) 08/01/2013  . Speech abnormality 07/25/2013  . Obesity 05/10/2013  . Lower extremity edema 05/10/2013  . GERD (gastroesophageal reflux disease) 05/10/2013  . Arthritis 05/10/2013  . OA (osteoarthritis) of knee 03/15/2013  . Headache 10/25/2012  . Palpitations 10/18/2012  . Fatty liver determined by biopsy 06/01/2012  . Arthropathic psoriasis, unspecified ([place]) 04/29/2012  . Abnormal liver enzymes 03/29/2012  . Psoriatic arthritis ([place]) 12/29/2011  . Left Renal  Hydronephrosis 12/11/2010  . 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 Celiac disease 05/27/2010  . Thyroid nodule 05/27/2010  . Male-to-male transgender person 09/20/2002    Alinda Deem, MA CCC-SLP 05/08/2020, 1:11 PM  [facility] [address] [place], [place], [address] Phone: [phone]   Fax:  [phone]   Name: Jesse Bowers MRN: [id number] Date of Birth: [birth year]

## 2020-05-08 NOTE — Patient Instructions (Signed)
Strategies for Improving Your Attention and Memory  Use good eye-contact  Give the speaker your undivided attention  Look directly at the speaker  Complete one task at a time  Avoid multitasking  Complete one task before starting a new one  Write a note to yourself if you think of something else that needs to be done  Let others know when you need quiet time and can't be interrupted  Don't answer the phone, texts, or emails while you are working on another task  Put aside distracting thoughts  If you find your mind wandering, refocus your attention on the speaker  Avoid off-topic comments or responses that may divert your attention   If something important comes to mind, let the speaker know and pause to write yourself a note: "Do you mind holding on a minute, I have to write something down."  Put thoughts on hold and focus on salient information  Limit distractions in your environment  Think about the environment around you  Limit background noise by turning off the TV or music, putting your phone away  Close the door and work in quiet  Use active listening  Actively participate in the conversation to stay focused  Paraphrase what you have heard to include the most important details  Adding some associations may help you remember  Ask questions to clarify certain points  Summarize the speaker's comments periodically  Avoid nodding your head and using "mhm" responses as these are more passive and don't help your attention  Alert the other person/people  It may be helpful to alert your listener to the fact that you may need reminders to keep on track  Tell the speaker in advance that you may need to stop them and have them repeat salient information  If you lose focus, interject and let the person know, "I'm sorry, I lost you, can you tell me again?"  Write down information  Write down pertinent information as it comes up, such as telephone numbers, names  of people, addresses, details from appointments and conversations, etc.

## 2020-05-09 ENCOUNTER — Ambulatory Visit (INDEPENDENT_AMBULATORY_CARE_PROVIDER_SITE_OTHER): Payer: BC Managed Care – PPO | Admitting: Psychology

## 2020-05-09 DIAGNOSIS — F4323 Adjustment disorder with mixed anxiety and depressed mood: Secondary | ICD-10-CM

## 2020-05-14 ENCOUNTER — Encounter: Payer: Self-pay | Admitting: Cardiovascular Disease

## 2020-05-14 ENCOUNTER — Ambulatory Visit (INDEPENDENT_AMBULATORY_CARE_PROVIDER_SITE_OTHER): Payer: BC Managed Care – PPO | Admitting: Cardiovascular Disease

## 2020-05-14 ENCOUNTER — Ambulatory Visit (INDEPENDENT_AMBULATORY_CARE_PROVIDER_SITE_OTHER): Payer: BC Managed Care – PPO | Admitting: Psychology

## 2020-05-14 ENCOUNTER — Other Ambulatory Visit: Payer: Self-pay

## 2020-05-14 VITALS — BP 98/72 | HR 98 | Ht 69.0 in | Wt 229.8 lb

## 2020-05-14 DIAGNOSIS — G4733 Obstructive sleep apnea (adult) (pediatric): Secondary | ICD-10-CM

## 2020-05-14 DIAGNOSIS — F4323 Adjustment disorder with mixed anxiety and depressed mood: Secondary | ICD-10-CM | POA: Diagnosis not present

## 2020-05-14 DIAGNOSIS — M5136 Other intervertebral disc degeneration, lumbar region: Secondary | ICD-10-CM

## 2020-05-14 DIAGNOSIS — D6859 Other primary thrombophilia: Secondary | ICD-10-CM

## 2020-05-14 DIAGNOSIS — I5032 Chronic diastolic (congestive) heart failure: Secondary | ICD-10-CM | POA: Diagnosis not present

## 2020-05-14 DIAGNOSIS — L4050 Arthropathic psoriasis, unspecified: Secondary | ICD-10-CM

## 2020-05-14 DIAGNOSIS — Z7901 Long term (current) use of anticoagulants: Secondary | ICD-10-CM

## 2020-05-14 DIAGNOSIS — Z8673 Personal history of transient ischemic attack (TIA), and cerebral infarction without residual deficits: Secondary | ICD-10-CM

## 2020-05-14 NOTE — Progress Notes (Signed)
Cardiology Office Note    Date:  05/20/2020   ID:  Jesse Bowers, DOB Jun 24, 1965, MRN [id number]  PCP:  Shawnee Knapp, MD  Cardiologist:  Shelva Majestic, MD   F/U sleep evaluation, referred by Dr. [name] per patient request  History of Present Illness:  Jesse Bowers is a 55 y.o. adult transgender male to male on chronic androgen therapy who is followed by Dr. [name] for cardiology care.  He remotely had seen Dr. [name] at [facility] neurology for sleep apnea.  The patient has desired switching to new sleep provider and is referred to me for initiation of care.  Jesse Bowers has a history of chronic diastolic heart failure, previous stroke on chronic anticoagulation with Xarelto (protein C deficiency) and apparently has a history of sleep apnea.  He states his initial sleep study was done in [place] in 2015.  He has been evaluated at [facility] neurology for his sleep since moving to [place].  Reportedly, on previous evaluation he had obstructive sleep apnea with central apneas and was ultimately placed on ASV mode (EPAP 7 cm with pressure support 4-15 back in 2017.  His last evaluation at [facility] neurology he was dissatisfied and has requested switching providers.  His machine has begun to malfunction and essentially he has not been using therapy.    He has a ResMed air curve 10 ASV unit which reportedly was set at an EPAP of 7 with minimum pressure support 14 with maximum 15.  Over the last 30 days usage was only 1 day and only 1 hour and 24 minutes.  I saw him for initial evaluation with me May 2021.  He  has a history of snoring.  He admits to nonrestorative sleep.  He has had issues with insomnia.  His sleep issues became prominent after he had a stroke.  He does have frequent nocturia and often wakes up with dry mouth or sore throat.  An Epworth Sleepiness Scale score was calculated in the office today and this endorsed at 22 as shown below consistent with severe excessive daytime  sleepiness.  Epworth Sleepiness Scale: Situation   Chance of Dozing/Sleeping (0 = never , 1 = slight chance , 2 = moderate chance , 3 = high chance )   sitting and reading 3   watching TV 3   sitting inactive in a public place 3   being a passenger in a motor vehicle for an hour or more 3   lying down in the afternoon 3   sitting and talking to someone 2   sitting quietly after lunch (no alcohol) 3   while stopped for a few minutes in traffic as the driver 2   Total Score  22   He was typically going to bed between 10 and 11 PM but oftentimes was having difficulty falling asleep and oftentimes waking up in pain.  He denied any hypnagogic hallucinations.  He was unaware of restless legs.  He denied any cataplectic events.  An echo Doppler study done on May 04, 2019 showed an EF of 60 to 65% with moderate concentric LVH and grade 1 diastolic dysfunction.  During that evaluation, I recommended that he obtain a new ASV replacement machine.  Apparently, there was significant delay as result of the Respironics recall but ultimately after much back-and-forth requests, he received a new ResMed air curve 10 ASV unit on April 17, 2020.  [name] is his DME company.  Upon questioning, typically when he goes to bed he  does not put the machine on and oftentimes waits until he is about to fall asleep before he puts his mask on and turns the machine on.  As result at times he has significant difficulty in sleep initiation.  He has been using CPAP fairly consistently since his set up date but there was a 5-day period where his tubing frame broke and had to wait to get new equipment with tubing for reinstitution of therapy.  A download was obtained since his set up date.  His parameters are set at an EPAP of 7, minimum pressure support of 4 with a maximum pressure support of 15.  His 95th percentile IPAP pressure is 16.8 with a maximum IPAP pressure of 19.6.  AHI is 4.4.  He has not he has noticed that with  reinstitution of therapy he feels more alert and has more energy.  He typically goes to bed between 11 and midnight but may take him to between 2 am and 4 am to  fall asleep.  I discussed with him the importance of putting the mask on and turning his machine on before he actually goes into bed such that his sleep onset will be significantly improved and you have significantly increased sleep duration.  As result with his current sleep hygiene, his Epworth Sleepiness Scale score is still elevated at 19.  He did undergo a subsequent echo Doppler study on April 27, 2020 which showed an EF by 3D volume at 61%.  There was grade 1 diastolic dysfunction.  Global longitudinal strain was normal.  There was mild mitral regurgitation.  He presents for evaluation  Past Medical History:  Diagnosis Date  . Abnormal weight loss   . Anxiety   . Arthritis   . Cataract    OU  . Celiac disease   . Cervical neck pain with evidence of disc disease    patient has a cyst   . Chronic constipation   . Chronic diastolic heart failure ([place])    Pt. denies  . Chronic pain   . Degenerative disc disease at L5-S1 level    with stenosis  . DVT (deep venous thrombosis) (HCC)    Right upper arm, bilateral leg  . Eczema    inguinal, feet  . Elevated liver enzymes   . 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 Family history of adverse reaction to anesthesia    family has problems with anesthesia of nausea and vomiting   . 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 GERD (gastroesophageal reflux disease)    History of in 20's  . Gluten enteropathy   . H/O parotitis    right   . Hard of hearing   . History of kidney stones   . History of retinal tear    Bilateral  . History of staph infection    required wound vac  . Hx-TIA (transient ischemic attack)    2015  . LVH (left ventricular hypertrophy) 12/15/2016   Mild, noted on ECHO  . MVP (mitral valve prolapse)   . NAFL (nonalcoholic fatty liver)   . Neck  pain   . Neuromuscular disorder ([place])    bilateral neuropathy feet.  . Nuclear sclerotic cataract of both eyes 09/08/2019  . Pneumonia 12/17/2010  . Polycythemia   . Polycythemia, secondary   . PONV (postoperative nausea and vomiting)   . Protein C deficiency ([place])    Dr. Anne Fu  . Psoriasis    16 X10 cm psoriatic rash on sole of  left foot ; open and occ scant bleeding;   . psoriatic arthritis   . PTSD (post-traumatic stress disorder)   . Scaphoid fracture of wrist 09/23/2013  . Seizure ([place])    childhood, medication until age 63 then weanned completely off  . Sleep apnea    split night study last done by Dr. [name] 06/18/15 shows severe OSA, CSA, and hypersomnia, rec bipap  . Splenomegaly   . Stenosis of ureteropelvic junction (UPJ)    left  . Stroke [facility])    CVA vs TIA in left cerebrum causing slight right sided weakness-Dr. [name] follows  . Syrinx of spinal cord ([place]) 01/06/2014   c spine on MRI  . Tachycardia    hx of   . Transfusion history    past history- none recent, after surgeries due to blood loss  . Wears glasses   . Wears hearing aid     Past Surgical History:  Procedure Laterality Date  . ABDOMINAL HYSTERECTOMY Bilateral 1994   TAH, BSO- tranverse incision at 55 yo  . ANKLE ARTHROSCOPY WITH RECONSTRUCTION Right 2007  . CHOLECYSTECTOMY     laparoscopic  . COLONOSCOPY     x3  . EYE SURGERY     Left eye 03/02/2018, right 02/15/2018  . HIP ARTHROSCOPY W/ LABRAL REPAIR Right 05/11/2013   acetabular labral tear 03/30/2013  . KNEE ARTHROPLASTY Right   . KNEE JOINT MANIPULATION Left    x3 under anesthesia  . KNEE SURGERY Bilateral 1984   Right ACL, left PCL repair  . LITHOTRIPSY  2005  . LIVER BIOPSY  2013   normal results.  [name] MASTECTOMY Bilateral    prior to 2009  . MOUTH SURGERY    . NASAL SEPTUM SURGERY N/A 09/20/2015   by ENT Dr. [name]  . OVARIAN CYST SURGERY Left    size of grapefruit, was informed that she had shortened vagina  . SHOULDER  SURGERY Bilateral    Right 08/15/2016, Left 11/15/2016  . THUMB ARTHROSCOPY Left   . THYROIDECTOMY, PARTIAL Left 2008  . TOTAL KNEE ARTHROPLASTY Right 08/23/2018   Procedure: TOTAL KNEE ARTHROPLASTY;  Surgeon: [name], MD;  Location: [facility];  Service: Orthopedics;  Laterality: Right;  79mn  . TOTAL KNEE REVISION Left 02/06/2016   Procedure: LEFT TOTAL KNEE REVISION;  Surgeon: FGaynelle Arabian MD;  Location: [facility];  Service: Orthopedics;  Laterality: Left;  . TOTAL KNEE REVISION Left 04/22/2017   Procedure: Left knee polyethylene revision;  Surgeon: AGaynelle Arabian MD;  Location: [facility];  Service: Orthopedics;  Laterality: Left;  . UPPER GI ENDOSCOPY  2003    Current Medications: Outpatient Medications Prior to Visit  Medication Sig Dispense Refill  . acyclovir ointment (ZOVIRAX) 5 % as needed.    . baclofen (LIORESAL) 20 MG tablet Take 1 tablet (20 mg total) by mouth 4 (four) times daily. 120 tablet 3  . Calcipotriene-Betameth Diprop 0.005-0.064 % FOAM Apply 1 application topically 2 (two) times daily as needed.     . clobetasol (TEMOVATE) 0.05 % GEL Apply 1 application topically 2 (two) times daily as needed (itching).    . clorazepate (TRANXENE) 15 MG tablet Take 15 mg by mouth at bedtime.    . clorazepate (TRANXENE) 7.5 MG tablet Take 7.5 mg by mouth 2 (two) times daily.    . COSENTYX SENSOREADY, 300 MG, 150 MG/ML SOAJ Inject 2 Syringes into the skin every 28 (twenty-eight) days.    .Marland Kitchendesonide (DESOWEN) 0.05 % ointment Apply 1 application topically 2 (  two) times daily as needed (psoriasis).   2  . ergocalciferol (VITAMIN D2) 1.25 MG (50000 UT) capsule Take 1 capsule by mouth once a week.    . Fluocinolone Acetonide 0.01 % OIL Place 3 drops into both ears 2 (two) times daily as needed for itching.    . folic acid (FOLVITE) 1 MG tablet Take 1 mg by mouth daily.    [name] linaclotide (LINZESS) 145 MCG CAPS capsule Take 1 capsule (145 mcg total) by mouth daily before breakfast. 30 capsule 3   . methotrexate (RHEUMATREX) 2.5 MG tablet Take 10 mg by mouth once a week. Caution:Chemotherapy. Protect from light.    . morphine (KADIAN) 60 MG 24 hr capsule Take 1 capsule (60 mg total) by mouth every 12 (twelve) hours. 60 capsule 0  . neomycin-polymyxin-hydrocortisone (CORTISPORIN) 3.5-10000-1 OTIC suspension Place 4 drops into both ears 2 (two) times daily as needed (ear pain).   1  . Oxycodone HCl 10 MG TABS Take 1 tablet (10 mg total) by mouth every 8 (eight) hours as needed. 70 tablet 0  . potassium chloride (KLOR-CON) 10 MEQ tablet TAKE 2 TABLETS BY MOUTH EVERY DAY 180 tablet 3  . rivaroxaban (XARELTO) 20 MG TABS tablet TAKE 1 TABLET(20 MG) BY MOUTH EVERY NIGHT AT BEDTIME 90 tablet 3  . testosterone cypionate (DEPOTESTOSTERONE CYPIONATE) 200 MG/ML injection INJECT 0.5 MLS (100 MG TOTAL) INTO THE MUSCLE ONCE A WEEK. 10 mL 0  . ursodiol (ACTIGALL) 250 MG tablet Take 250 mg by mouth 2 (two) times daily with a meal.     . venlafaxine XR (EFFEXOR-XR) 150 MG 24 hr capsule Take 2 capsules (300 mg total) by mouth daily with breakfast. 60 capsule 5  . ursodiol (ACTIGALL) 500 MG tablet Take 500 mg by mouth 2 (two) times daily with a meal.     . Cyanocobalamin (VITAMIN B-12) 5000 MCG SUBL Place 5,000 mcg under the tongue daily.    . finasteride (PROPECIA) 1 MG tablet Take 1 mg by mouth daily.    . furosemide (LASIX) 40 MG tablet Take by mouth daily.    [name] acyclovir cream (ZOVIRAX) 5 % Apply topically.    . ciprofloxacin-dexamethasone (CIPRODEX) OTIC suspension     . clindamycin (CLEOCIN) 300 MG capsule Take 300 mg by mouth 3 (three) times daily.    . diclofenac sodium (VOLTAREN) 1 % GEL APPLY TOPICALLY TO BOTH HANDS 3 TIMES DAILY. (Patient taking differently: Apply 1 application topically 3 (three) times daily as needed.) 300 g 4  . furosemide (LASIX) 20 MG tablet TAKE 1 TABLET BY MOUTH DAILY. TO BE TAKEN WITH THE 40 MG TO EQUAL 60 MG DAILY 90 tablet 2  . furosemide (LASIX) 40 MG tablet TAKE 1  TABLET BY MOUTH DAILY. TO BE TAKEN WITH THE 20 MG TO EQUAL 60 MG DAILY. 90 tablet 2  . Secukinumab 150 MG/ML SOAJ Inject 300 mg into the skin every 28 (twenty-eight) days.     . ursodiol (ACTIGALL) 500 MG tablet Take by mouth.    . Vitamin D, Ergocalciferol, (DRISDOL) 1.25 MG (50000 UNIT) CAPS capsule Take 50,000 Units by mouth once a week.     No facility-administered medications prior to visit.     Allergies:   Penicillin g, Sulfa antibiotics, Tegaderm ag mesh [silver], Ibuprofen, Pregabalin, and Sulfacetamide sodium-sulfur   Social History   Socioeconomic History  . Marital status: Married    Spouse name: Not on file  . Number of children: 2  . Years of education:  4y college  . Highest education level: Not on file  Occupational History  . Occupation: Pediatric Nurse practitioner    Comment: Not working since [place] 2015  Tobacco Use  . Smoking status: Never Smoker  . Smokeless tobacco: Never Used  Vaping Use  . Vaping Use: Never used  Substance and Sexual Activity  . Alcohol use: Yes    Comment: social  . Drug use: No  . Sexual activity: Yes    Birth control/protection: None    Comment: patient is a transgender on testosterone shots, no biological kids  Other Topics Concern  . Not on file  Social History Narrative   Education 4 year college, former Therapist, sports X 15 years, pediatric nurse practitioner x 6 years, did NP degree from [place] of [place]. Relocated to [place] about 2 months ago from [place], [place]. Patient was in [place] for last 4 years and prior to that in [place]. His wife is working as Scientist, research (physical sciences) for [employer]. Patient is not working and applying for disability. They have 2 kids but no biologic children.    Social Determinants of Health   Financial Resource Strain: Not on file  Food Insecurity: Not on file  Transportation Needs: Not on file  Physical Activity: Not on file  Stress: Not on file  Social Connections: Not on file    Socially he was born in  [place].  His transgender change was done in [place] at [place] History:  The patient's family history includes Breast cancer in his sister; Breast cancer (age of onset: 60) in his maternal aunt; Cancer in his paternal grandfather; Congestive Heart Failure in his maternal grandmother; Glaucoma in his maternal grandfather and mother; Heart attack in his maternal grandfather, maternal grandmother, and paternal grandfather; Hypertension in his mother; Macular degeneration in his maternal grandfather; Other in his mother; Polycythemia in his paternal uncle; Protein C deficiency (age of onset: 70) in his sister; Psoriasis in his mother; Stroke in his maternal grandfather, maternal grandmother, and paternal uncle.   ROS General: Negative; No fevers, chills, or night sweats;  HEENT: Negative; No changes in vision or hearing, sinus congestion, difficulty swallowing Bilateral hearing aids History of deviated septum, status post surgery by Dr. Radene Journey Pulmonary: Negative; No cough, wheezing, shortness of breath, hemoptysis Cardiovascular: Chronic diastolic heart failure GI: Negative; No nausea, vomiting, diarrhea, or abdominal pain GU: Negative; No dysuria, hematuria, or difficulty voiding Musculoskeletal: History of psoriatic arthritis Hematologic/Oncology: Negative; no easy bruising, bleeding Endocrine: Transgender male to male on chronic androgen therapy with transition beginning around age 44 Neuro: Prior stroke Right L5-S1 stenosis Cervical C4-C5 stenosis. Skin: Negative; No rashes or skin lesions Psychiatric: Negative; No behavioral problems, depression Sleep: Negative; No snoring, daytime sleepiness, hypersomnolence, bruxism, restless legs, hypnogognic hallucinations, no cataplexy Other comprehensive 14 point system review is negative.   PHYSICAL EXAM:   VS:  BP 98/72 (BP Location: Left Arm, Patient Position: Sitting)   Pulse 98   Ht 5' 9"  (1.753 m)   Wt 229 lb 12.8 oz  (104.2 kg)   SpO2 99%   BMI 33.94 kg/m     Repeat blood pressure by me 120/70  Wt Readings from Last 3 Encounters:  05/14/20 229 lb 12.8 oz (104.2 kg)  03/21/20 232 lb (105.2 kg)  01/02/20 222 lb (100.7 kg)    General: Alert, oriented, no distress.  Skin: normal turgor, no rashes, warm and dry HEENT: Normocephalic, atraumatic. Pupils equal round and reactive to light; sclera anicteric; extraocular muscles  intact;  Nose without nasal septal hypertrophy Mouth/Parynx benign; Mallinpatti scale 3 Neck: No JVD, no carotid bruits; normal carotid upstroke Lungs: clear to ausculatation and percussion; no wheezing or rales Chest wall: without tenderness to palpitation Heart: PMI not displaced, RRR, s1 s2 normal, 1/6 systolic murmur, no diastolic murmur, no rubs, gallops, thrills, or heaves Abdomen: soft, nontender; no hepatosplenomehaly, BS+; abdominal aorta nontender and not dilated by palpation. Back: no CVA tenderness Pulses 2+ Musculoskeletal: full range of motion, normal strength, no joint deformities Extremities: no clubbing cyanosis or edema, Homan's sign negative  Neurologic: grossly nonfocal; Cranial nerves grossly wnl Psychologic: Normal mood and affect   Studies/Labs Reviewed:   EKG:  EKG is not ordered today.  I personally reviewed the ECG from 06/25/2019 which shows normal sinus rhythm at 79 bpm with rightward axis.  Recent Labs: BMP Latest Ref Rng & Units 09/28/2019 02/23/2019 08/25/2018  Glucose 70 - 99 mg/dL 101(H) 104(H) 138(H)  BUN 6 - 20 mg/dL 13 14 13   Creatinine 0.61 - 1.24 mg/dL 1.29(H) 1.09 0.84  BUN/Creat Ratio 9 - 20 - - -  Sodium 135 - 145 mmol/L 138 138 133(L)  Potassium 3.5 - 5.1 mmol/L 4.3 3.9 3.6  Chloride 98 - 111 mmol/L 101 102 99  CO2 22 - 32 mmol/L 28 26 23   Calcium 8.9 - 10.3 mg/dL 9.7 8.8(L) 8.1(L)     Hepatic Function Latest Ref Rng & Units 09/28/2019 02/23/2019 08/18/2018  Total Protein 6.5 - 8.1 g/dL 6.9 7.0 7.1  Albumin 3.5 - 5.0 g/dL 4.3 4.2  4.4  AST 15 - 41 U/L 20 23 28   ALT 0 - 44 U/L 21 22 22   Alk Phosphatase 38 - 126 U/L 193(H) 227(H) 169(H)  Total Bilirubin 0.3 - 1.2 mg/dL 0.6 0.5 0.4  Bilirubin, Direct 0.00 - 0.40 mg/dL - - -    CBC Latest Ref Rng & Units 09/28/2019 05/23/2019 03/11/2019  WBC 4.0 - 10.5 K/uL 4.0 3.2(L) 4.7  Hemoglobin 13.0 - 17.0 g/dL 12.8(L) 12.2(L) 13.6  Hematocrit 39.0 - 52.0 % 42.3 41.6 44.8  Platelets 150 - 400 K/uL 203 215 232   Lab Results  Component Value Date   MCV 77.5 (L) 09/28/2019   MCV 76.2 (L) 05/23/2019   MCV 78.7 (L) 03/11/2019   Lab Results  Component Value Date   TSH 1.370 04/29/2018   No results found for: HGBA1C   BNP No results found for: BNP  ProBNP No results found for: PROBNP   Lipid Panel     Component Value Date/Time   CHOL 142 01/05/2018 1509   TRIG 45 01/05/2018 1509   HDL 52 01/05/2018 1509   CHOLHDL 2.7 01/05/2018 1509   LDLCALC 81 01/05/2018 1509   LABVLDL 9 01/05/2018 1509     RADIOLOGY: ECHOCARDIOGRAM COMPLETE  Result Date: 04/28/2020    ECHOCARDIOGRAM REPORT   Patient Name:   Jesse Bowers  Date of Exam: 04/27/2020 Medical Rec #:  [id number]     Height:       69.0 in Accession #:    [id number]    Weight:       232.0 lb Date of Birth:  [birth year]     BSA:          2.201 m Patient Age:    44 years      BP:           126/84 mmHg Patient Gender: M             HR:  89 bpm. Exam Location:  [address] Procedure: 2D Echo, 3D Echo, Cardiac Doppler, Color Doppler and Strain Analysis Indications:    W43.15 Chronic diastolic heart failure.  History:        Patient has prior history of Echocardiogram examinations, most                 recent 05/04/2019. CHF, TIA, Arrythmias:Palpitation.; Risk                 Factors:Sleep Apnea. Hep B. Anemia.  Sonographer:    Jessee Avers, RDCS Referring Phys: [place]  1. Left ventricular ejection fraction by 3D volume is 61 %. The left ventricle has normal function. The left ventricle has no  regional wall motion abnormalities. Left ventricular diastolic parameters are consistent with Grade I diastolic dysfunction (impaired relaxation). The average left ventricular global longitudinal strain is -24.0 %. The global longitudinal strain is normal.  2. Right ventricular systolic function is normal. The right ventricular size is normal. There is normal pulmonary artery systolic pressure. The estimated right ventricular systolic pressure is 40.0 mmHg.  3. The mitral valve is normal in structure. Mild mitral valve regurgitation. No evidence of mitral stenosis.  4. The aortic valve is normal in structure. Aortic valve regurgitation is not visualized. No aortic stenosis is present.  5. The inferior vena cava is normal in size with greater than 50% respiratory variability, suggesting right atrial pressure of 3 mmHg. Comparison(s): 05/04/19 EF 60-65%. FINDINGS  Left Ventricle: Left ventricular ejection fraction by 3D volume is 61 %. The left ventricle has normal function. The left ventricle has no regional wall motion abnormalities. The average left ventricular global longitudinal strain is -24.0 %. The global  longitudinal strain is normal. The left ventricular internal cavity size was normal in size. There is no left ventricular hypertrophy. Left ventricular diastolic parameters are consistent with Grade I diastolic dysfunction (impaired relaxation). Right Ventricle: The right ventricular size is normal. No increase in right ventricular wall thickness. Right ventricular systolic function is normal. There is normal pulmonary artery systolic pressure. The tricuspid regurgitant velocity is 2.59 m/s, and  with an assumed right atrial pressure of 3 mmHg, the estimated right ventricular systolic pressure is 86.7 mmHg. Left Atrium: Left atrial size was normal in size. Right Atrium: Right atrial size was normal in size. Pericardium: There is no evidence of pericardial effusion. Mitral Valve: The mitral valve is normal in  structure. Mild mitral valve regurgitation. No evidence of mitral valve stenosis. Tricuspid Valve: The tricuspid valve is normal in structure. Tricuspid valve regurgitation is not demonstrated. No evidence of tricuspid stenosis. Aortic Valve: The aortic valve is normal in structure. Aortic valve regurgitation is not visualized. No aortic stenosis is present. Pulmonic Valve: The pulmonic valve was normal in structure. Pulmonic valve regurgitation is not visualized. No evidence of pulmonic stenosis. Aorta: The aortic root and ascending aorta are structurally normal, with no evidence of dilitation. Venous: The inferior vena cava is normal in size with greater than 50% respiratory variability, suggesting right atrial pressure of 3 mmHg. IAS/Shunts: No atrial level shunt detected by color flow Doppler.  LEFT VENTRICLE PLAX 2D LVIDd:         5.10 cm         Diastology LVIDs:         3.10 cm         LV e' medial:    5.87 cm/s LV PW:  1.00 cm         LV E/e' medial:  12.3 LV IVS:        1.00 cm         LV e' lateral:   9.25 cm/s LVOT diam:     2.20 cm         LV E/e' lateral: 7.8 LV SV:         93 LV SV Index:   42              2D LVOT Area:     3.80 cm        Longitudinal                                Strain                                2D Strain GLS  -20.9 %                                (A2C):                                2D Strain GLS  -25.0 %                                (A3C):                                2D Strain GLS  -26.2 %                                (A4C):                                2D Strain GLS  -24.0 %                                Avg:                                 3D Volume EF                                LV 3D EF:    Left                                             ventricular                                             ejection  fraction by                                             3D volume                                              is 61 %.                                 3D Volume EF:                                3D EF:        61 %                                LV EDV:       126 ml                                LV ESV:       49 ml                                LV SV:        77 ml RIGHT VENTRICLE RV Basal diam:  3.30 cm RV S prime:     18.10 cm/s TAPSE (M-mode): 2.3 cm RVSP:           29.8 mmHg LEFT ATRIUM             Index       RIGHT ATRIUM           Index LA diam:        5.00 cm 2.27 cm/m  RA Pressure: 3.00 mmHg LA Vol (A2C):   30.6 ml 13.91 ml/m RA Area:     12.60 cm LA Vol (A4C):   56.9 ml 25.86 ml/m RA Volume:   28.90 ml  13.13 ml/m LA Biplane Vol: 44.2 ml 20.09 ml/m  AORTIC VALVE LVOT Vmax:   144.00 cm/s LVOT Vmean:  87.000 cm/s LVOT VTI:    0.244 m  AORTA Ao Root diam: 3.60 cm Ao Asc diam:  3.80 cm MITRAL VALVE                TRICUSPID VALVE                             TR Peak grad:   26.8 mmHg                             TR Vmax:        259.00 cm/s MV E velocity: 72.30 cm/s   Estimated RAP:  3.00 mmHg MV A velocity: 107.00 cm/s  RVSP:           29.8 mmHg MV E/A ratio:  0.68  SHUNTS                             Systemic VTI:  0.24 m                             Systemic Diam: 2.20 cm Cherlynn Kaiser MD Electronically signed by Cherlynn Kaiser MD Signature Date/Time: 04/28/2020/12:12:17 PM    Final    OCT, Retina - OU - Both Eyes  Result Date: 05/07/2020 Right Eye Quality was good. Scan locations included subfoveal. Central Foveal Thickness: 295. Progression has been stable. Left Eye Quality was good. Scan locations included subfoveal. Central Foveal Thickness: 294. Progression has been stable. Notes OU slightly thicker maculae as compared to last OCT December 2019 however no overt CME Very minor micro-CME may be seen in the inner polar perifoveal regions but these are not pathologic  Color Fundus Photography Optos - OU - Both Eyes  Result Date: 05/07/2020 Right Eye Progression has improved.  Disc findings include normal observations. Macula : normal observations. Vessels : normal observations. Periphery : normal observations. Left Eye Progression has improved. Disc findings include normal observations. Macula : normal observations. Vessels : normal observations. Periphery : normal observations. Notes Clear media OU.  Good retinopexy inferotemporal OU and regions of previous lattice degeneration, no holes or tears    Additional studies/ records that were reviewed today include:  I have reviewed the records of Dr. Recardo Evangelist, [facility] neurology, as well as Dr. Burr Medico of hematology. A download was obtained today of his ASV unit from April 20 through Jul 20, 2019.  ASSESSMENT:    1. OSA (obstructive sleep apnea)   2. Chronic diastolic heart failure ([place])   3. Long term current use of anticoagulant   4. H/O: CVA (cerebrovascular accident)   5. Protein C deficiency ([place])   6. Degenerative disc disease, lumbar and cervical   7. Psoriatic arthritis [facility])     PLAN:  Jesse Bowers is a 55 year old transgender male to male who has a history of chronic diastolic heart failure, remote history of stroke with protein C deficiency on Xarelto anticoagulation, degenerative disc disease, psoriatic arthritis as well as a history of significant obstructive sleep apnea originally diagnosed in [place] in 2015.  The patient has a history of deviated septal surgery in 2018.  He had been followed by Dr. [name] at [facility] neurology and when I initially saw him he was no longer being followed by them and had been discharged from their practice. He had an ASV machine due to complex sleep apnea with obstruction as well as central events.  I had a very long discussion with him today regarding the importance of therapy.  He apparently has not been using treatment and over the past 30 days only used therapy on one occasion.  He states his machine has begun to malfunction and as result he has not been  compliant.  He had significant excessive daytime sleepiness with an Epworth Sleepiness Scale score calculating today at 22.  I discussed with him the potential adverse consequences of untreated sleep apnea with reference to his cardiovascular health including risk for atrial fibrillation or nocturnal arrhythmias, blood pressure issues, potential for nocturnal hypoxemia contributing to ischemia, as well as its effects on diabetes mellitus, GERD, and inflammation.  He also has significant issues with back discomfort from cervical and lumbar disc disease, celiac disease, as well as  psoriatic arthritis.  Typically he goes to bed between 10 and 11 PM but oftentimes may have difficulty falling asleep and oftentimes wakes up in pain.  He is typically up at 6:30 in the morning.  After much delay, he ultimately received a new ResMed air curve 10 ASV device with set up date on April 17, 2020.  [name] is his DME company.  He has noticed significant benefit since his brief reinitiation of treatment.  Shortly after starting therapy however, the frame of his tubing had broken and as result he was unable to use therapy until he received a new tube.  He continues to have increased daytime sleepiness with an Epworth scale at 19.  Apparently although he may go to bed between 10 and 11 he had not been putting his mask and starting his machine until he felt like he was just about to fall asleep.  I discussed with him what he needs to do instead is to put his mask on as well as the machine as he is about to go into bed.  This will significantly aid his sleep initiation and improved sleep duration his blood pressure today is stable and on repeat by me 120/70.  I will have him obtain a new download in 1 month with hopefully improve sleep hygiene.  He does have some mild pretibial edema for which he takes furosemide.  He continues to be on anticoagulation without bleeding since his stroke.  I will see him in 1 year for follow-up sleep  evaluation or sooner as needed.   Medication Adjustments/Labs and Tests Ordered: Current medicines are reviewed at length with the patient today.  Concerns regarding medicines are outlined above.  Medication changes, Labs and Tests ordered today are listed in the Patient Instructions below. Patient Instructions  Medication Instructions:   NO CHANGES  *If you need a refill on your cardiac medications before your next appointment, please call your pharmacy*   Lab Work: NOT NEEDED    Testing/Procedures: NOT NEEDED   Follow-Up: At [facility], you and your health needs are our priority.  As part of our continuing mission to provide you with exceptional heart care, we have created designated Provider Care Teams.  These Care Teams include your primary Cardiologist (physician) and Advanced Practice Providers (APPs -  Physician Assistants and Nurse Practitioners) who all work together to provide you with the care you need, when you need it.     Your next appointment:   12 month(s)  The format for your next appointment:   In Person  Provider:   Shelva Majestic, MD   Other Instructions  WILL OBTAIN A DOWNLOAD FROM C-PAP  IN ONE MONTH AFTER April 6 , 2022    Signed, Shelva Majestic, MD  05/20/2020 3:31 PM    [facility] [address], [place], [place], [place]  [address] Phone: [phone]

## 2020-05-14 NOTE — Telephone Encounter (Signed)
Notified Ivin Booty, RN covering Dr. Claiborne Billings today that patient would be late. She is aware and OK  Patient has been called.

## 2020-05-14 NOTE — Patient Instructions (Signed)
Medication Instructions:   NO CHANGES  *If you need a refill on your cardiac medications before your next appointment, please call your pharmacy*   Lab Work: NOT NEEDED    Testing/Procedures: NOT NEEDED   Follow-Up: At [facility], you and your health needs are our priority.  As part of our continuing mission to provide you with exceptional heart care, we have created designated Provider Care Teams.  These Care Teams include your primary Cardiologist (physician) and Advanced Practice Providers (APPs -  Physician Assistants and Nurse Practitioners) who all work together to provide you with the care you need, when you need it.     Your next appointment:   12 month(s)  The format for your next appointment:   In Person  Provider:   Shelva Majestic, MD   Other Instructions  WILL OBTAIN A DOWNLOAD FROM C-PAP  IN ONE MONTH AFTER April 6 , 2022

## 2020-05-15 ENCOUNTER — Other Ambulatory Visit: Payer: Self-pay | Admitting: Family Medicine

## 2020-05-15 DIAGNOSIS — R7401 Elevation of levels of liver transaminase levels: Secondary | ICD-10-CM

## 2020-05-15 DIAGNOSIS — R1012 Left upper quadrant pain: Secondary | ICD-10-CM

## 2020-05-15 DIAGNOSIS — R109 Unspecified abdominal pain: Secondary | ICD-10-CM

## 2020-05-16 ENCOUNTER — Ambulatory Visit: Payer: BC Managed Care – PPO

## 2020-05-16 ENCOUNTER — Ambulatory Visit: Payer: BC Managed Care – PPO | Admitting: Psychology

## 2020-05-20 ENCOUNTER — Encounter: Payer: Self-pay | Admitting: Cardiovascular Disease

## 2020-05-21 ENCOUNTER — Encounter (INDEPENDENT_AMBULATORY_CARE_PROVIDER_SITE_OTHER): Payer: Self-pay | Admitting: Ophthalmology

## 2020-05-21 ENCOUNTER — Other Ambulatory Visit: Payer: Self-pay

## 2020-05-21 ENCOUNTER — Ambulatory Visit (INDEPENDENT_AMBULATORY_CARE_PROVIDER_SITE_OTHER): Payer: BC Managed Care – PPO | Admitting: Ophthalmology

## 2020-05-21 ENCOUNTER — Ambulatory Visit (INDEPENDENT_AMBULATORY_CARE_PROVIDER_SITE_OTHER): Payer: BC Managed Care – PPO | Admitting: Psychology

## 2020-05-21 DIAGNOSIS — F4323 Adjustment disorder with mixed anxiety and depressed mood: Secondary | ICD-10-CM | POA: Diagnosis not present

## 2020-05-21 DIAGNOSIS — H35412 Lattice degeneration of retina, left eye: Secondary | ICD-10-CM | POA: Diagnosis not present

## 2020-05-21 DIAGNOSIS — H35073 Retinal telangiectasis, bilateral: Secondary | ICD-10-CM

## 2020-05-21 NOTE — Progress Notes (Signed)
05/21/2020     CHIEF COMPLAINT Patient presents for Retina Follow Up (2 Week Mac Tel f\u OU. OCT/Pt states OU vision is still blurry. Pt sees a large gray floater in OD.)   HISTORY OF PRESENT ILLNESS: Jesse Bowers is a 55 y.o. adult who presents to the clinic today for:   HPI    Retina Follow Up    Diagnosis: Mac Tel.  In both eyes.  Severity is moderate.  Duration of 2 weeks.  Since onset it is stable.  I, the attending physician,  performed the HPI with the patient and updated documentation appropriately. Additional comments: 2 Week Mac Tel f\u OU. OCT Pt states OU vision is still blurry. Pt sees a large gray floater in OD.          Comments    Patient has no change in his blurred vision per se, some 2 weeks after resumption of BiPAP and only 1 week with a good fitting mask She has intermittent double vision he says where things are vertically distorted 1 eye over the other which occurs intermittently mostly near the end of the day.  I will asked the patient to follow-up with Dr. Ruthy Dick for further evaluation of this         Last edited by Hurman Horn, MD on 05/21/2020  3:46 PM. (History)      Referring physician: Shawnee Knapp, MD [place],  [place] [address]  HISTORICAL INFORMATION:   Selected notes from the [name]: No current outpatient medications on file. (Ophthalmic Drugs)   No current facility-administered medications for this visit. (Ophthalmic Drugs)   Current Outpatient Medications (Other)  Medication Sig  . acyclovir ointment (ZOVIRAX) 5 % as needed.  . baclofen (LIORESAL) 20 MG tablet Take 1 tablet (20 mg total) by mouth 4 (four) times daily.  . Calcipotriene-Betameth Diprop 0.005-0.064 % FOAM Apply 1 application topically 2 (two) times daily as needed.   . clobetasol (TEMOVATE) 0.05 % GEL Apply 1 application topically 2 (two) times daily as needed (itching).  . clorazepate (TRANXENE) 15 MG tablet  Take 15 mg by mouth at bedtime.  . clorazepate (TRANXENE) 7.5 MG tablet Take 7.5 mg by mouth 2 (two) times daily.  . COSENTYX SENSOREADY, 300 MG, 150 MG/ML SOAJ Inject 2 Syringes into the skin every 28 (twenty-eight) days.  . Cyanocobalamin (VITAMIN B-12) 5000 MCG SUBL Place 5,000 mcg under the tongue daily.  [name] desonide (DESOWEN) 0.05 % ointment Apply 1 application topically 2 (two) times daily as needed (psoriasis).   [name] ergocalciferol (VITAMIN D2) 1.25 MG (50000 UT) capsule Take 1 capsule by mouth once a week.  . finasteride (PROPECIA) 1 MG tablet Take 1 mg by mouth daily.  . Fluocinolone Acetonide 0.01 % OIL Place 3 drops into both ears 2 (two) times daily as needed for itching.  . folic acid (FOLVITE) 1 MG tablet Take 1 mg by mouth daily.  . furosemide (LASIX) 40 MG tablet Take by mouth daily.  [name] linaclotide (LINZESS) 145 MCG CAPS capsule Take 1 capsule (145 mcg total) by mouth daily before breakfast.  . methotrexate (RHEUMATREX) 2.5 MG tablet Take 10 mg by mouth once a week. Caution:Chemotherapy. Protect from light.  . morphine (KADIAN) 60 MG 24 hr capsule Take 1 capsule (60 mg total) by mouth every 12 (twelve) hours.  [name] neomycin-polymyxin-hydrocortisone (CORTISPORIN) 3.5-10000-1 OTIC suspension Place 4 drops into both ears 2 (two)  times daily as needed (ear pain).   . Oxycodone HCl 10 MG TABS Take 1 tablet (10 mg total) by mouth every 8 (eight) hours as needed.  . potassium chloride (KLOR-CON) 10 MEQ tablet TAKE 2 TABLETS BY MOUTH EVERY DAY  . rivaroxaban (XARELTO) 20 MG TABS tablet TAKE 1 TABLET(20 MG) BY MOUTH EVERY NIGHT AT BEDTIME  . testosterone cypionate (DEPOTESTOSTERONE CYPIONATE) 200 MG/ML injection INJECT 0.5 MLS (100 MG TOTAL) INTO THE MUSCLE ONCE A WEEK.  . ursodiol (ACTIGALL) 250 MG tablet Take 250 mg by mouth 2 (two) times daily with a meal.   . venlafaxine XR (EFFEXOR-XR) 150 MG 24 hr capsule Take 2 capsules (300 mg total) by mouth daily with breakfast.   No current  facility-administered medications for this visit. (Other)      REVIEW OF SYSTEMS:    ALLERGIES Allergies  Allergen Reactions  . Penicillin G Anaphylaxis and Other (See Comments)    Has patient had a PCN reaction causing immediate rash, facial/tongue/throat swelling, SOB or lightheadedness with hypotension: Yes Has patient had a PCN reaction causing severe rash involving mucus membranes or skin necrosis: No Has patient had a PCN reaction that required hospitalization Yes Has patient had a PCN reaction occurring within the last 10 years: No If all of the above answers are "NO", then may proceed with Cephalosporin use.   . Sulfa Antibiotics Rash    Stevens-Johnson rash  . Tegaderm Ag Mesh [Silver] Dermatitis    Causes blistering wounds   . Ibuprofen Other (See Comments)    Contraindicated with Xarelto.   . Pregabalin Other (See Comments)    Ineffective  . Sulfacetamide Sodium-Sulfur Rash    PAST MEDICAL HISTORY Past Medical History:  Diagnosis Date  . Abnormal weight loss   . Anxiety   . Arthritis   . Cataract    OU  . Celiac disease   . Cervical neck pain with evidence of disc disease    patient has a cyst   . Chronic constipation   . Chronic diastolic heart failure ([place])    Pt. denies  . Chronic pain   . Degenerative disc disease at L5-S1 level    with stenosis  . DVT (deep venous thrombosis) ([facility])    Right upper arm, bilateral leg  . Eczema    inguinal, feet  . Elevated liver enzymes   . 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 Family history of adverse reaction to anesthesia    family has problems with anesthesia of nausea and vomiting   . 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 GERD (gastroesophageal reflux disease)    History of in 20's  . Gluten enteropathy   . H/O parotitis    right   . Hard of hearing   . History of kidney stones   . History of retinal tear    Bilateral  . History of staph infection    required wound vac  . Hx-TIA  (transient ischemic attack)    2015  . LVH (left ventricular hypertrophy) 12/15/2016   Mild, noted on ECHO  . MVP (mitral valve prolapse)   . NAFL (nonalcoholic fatty liver)   . Neck pain   . Neuromuscular disorder ([place])    bilateral neuropathy feet.  . Nuclear sclerotic cataract of both eyes 09/08/2019  . Pneumonia 12/17/2010  . Polycythemia   . Polycythemia, secondary   . PONV (postoperative nausea and vomiting)   . Protein C deficiency ([place])    Dr. Anne Fu  .  Psoriasis    16 X10 cm psoriatic rash on sole of left foot ; open and occ scant bleeding;   . psoriatic arthritis   . PTSD (post-traumatic stress disorder)   . Scaphoid fracture of wrist 09/23/2013  . Seizure ([place])    childhood, medication until age 29 then weanned completely off  . Sleep apnea    split night study last done by Dr. [name] 06/18/15 shows severe OSA, CSA, and hypersomnia, rec bipap  . Splenomegaly   . Stenosis of ureteropelvic junction (UPJ)    left  . Stroke [facility])    CVA vs TIA in left cerebrum causing slight right sided weakness-Dr. [name] follows  . 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 Tachycardia    hx of   . Transfusion history    past history- none recent, after surgeries due to blood loss  . Wears glasses   . Wears hearing aid    Past Surgical History:  Procedure Laterality Date  . ABDOMINAL HYSTERECTOMY Bilateral 1994   TAH, BSO- tranverse incision at 55 yo  . ANKLE ARTHROSCOPY WITH RECONSTRUCTION Right 2007  . CHOLECYSTECTOMY     laparoscopic  . COLONOSCOPY     x3  . EYE SURGERY     Left eye 03/02/2018, right 02/15/2018  . HIP ARTHROSCOPY W/ LABRAL REPAIR Right 05/11/2013   acetabular labral tear 03/30/2013  . KNEE ARTHROPLASTY Right   . KNEE JOINT MANIPULATION Left    x3 under anesthesia  . KNEE SURGERY Bilateral 1984   Right ACL, left PCL repair  . LITHOTRIPSY  2005  . LIVER BIOPSY  2013   normal results.  [name] MASTECTOMY Bilateral    prior to 2009  .  MOUTH SURGERY    . NASAL SEPTUM SURGERY N/A 09/20/2015   by ENT Dr. [name]  . OVARIAN CYST SURGERY Left    size of grapefruit, was informed that she had shortened vagina  . SHOULDER SURGERY Bilateral    Right 08/15/2016, Left 11/15/2016  . THUMB ARTHROSCOPY Left   . THYROIDECTOMY, PARTIAL Left 2008  . TOTAL KNEE ARTHROPLASTY Right 08/23/2018   Procedure: TOTAL KNEE ARTHROPLASTY;  Surgeon: [name], MD;  Location: [facility];  Service: Orthopedics;  Laterality: Right;  34mn  . TOTAL KNEE REVISION Left 02/06/2016   Procedure: LEFT TOTAL KNEE REVISION;  Surgeon: FGaynelle Arabian MD;  Location: [facility];  Service: Orthopedics;  Laterality: Left;  . TOTAL KNEE REVISION Left 04/22/2017   Procedure: Left knee polyethylene revision;  Surgeon: AGaynelle Arabian MD;  Location: [facility];  Service: Orthopedics;  Laterality: Left;  . UPPER GI ENDOSCOPY  2003    FAMILY HISTORY Family History  Problem Relation Age of Onset  . Stroke Maternal Grandfather        559 . Heart attack Maternal Grandfather   . Glaucoma Maternal Grandfather   . Macular degeneration Maternal Grandfather   . Breast cancer Sister   . Hypertension Mother   . Psoriasis Mother   . Other Mother        meningioma developed ~2019  . Glaucoma Mother   . Cancer Paternal Grandfather   . Heart attack Paternal Grandfather   . Stroke Paternal Uncle        age 55 . Polycythemia Paternal Uncle   . Stroke Maternal Grandmother   . Congestive Heart Failure Maternal Grandmother   . Heart attack Maternal Grandmother   . Protein C deficiency Sister 371  Miscarriages  . Breast cancer Maternal Aunt 3    SOCIAL HISTORY Social History   Tobacco Use  . Smoking status: Never Smoker  . Smokeless tobacco: Never Used  Vaping Use  . Vaping Use: Never used  Substance Use Topics  . Alcohol use: Yes    Comment: social  . Drug use: No         OPHTHALMIC EXAM: Base Eye Exam    Visual Acuity (Snellen - Linear)      Right Left   Dist  [place] 20/60 +1 20/60   Dist ph [place] 20/60 +2 20/50 -2       Tonometry (Tonopen, 2:51 PM)      Right Left   Pressure 12 11       Neuro/Psych    Oriented x3: Yes   Mood/Affect: Normal       Dilation    Both eyes: 1.0% Mydriacyl, 2.5% Phenylephrine @ 2:51 PM        Slit Lamp and Fundus Exam    External Exam      Right Left   External Normal Normal       Slit Lamp Exam      Right Left   Lids/Lashes Normal Normal   Conjunctiva/Sclera White and quiet White and quiet   Cornea Clear Clear   Anterior Chamber Deep and quiet Deep and quiet   Iris Round and reactive Round and reactive   Lens Centered posterior chamber intraocular lens Centered posterior chamber intraocular lens   Anterior Vitreous Normal Normal       Fundus Exam      Right Left   Posterior Vitreous Normal Normal   Disc Normal Normal   C/D Ratio 0.2 0.2   Macula Normal Normal   Vessels Normal Normal   Periphery Lattice generation superonasal, inferonasal and inferotemporal each with good laser photocoagulation reaction. Patient inferotemporal no treatment needed at 530, pexy around lattice degeneration superotemporal.          IMAGING AND PROCEDURES  Imaging and Procedures for 05/21/20  OCT, Retina - OU - Both Eyes       Right Eye Quality was good. Scan locations included subfoveal. Central Foveal Thickness: 291. Progression has been stable. Findings include normal foveal contour.   Left Eye Quality was good. Scan locations included subfoveal. Central Foveal Thickness: 290. Progression has been stable. Findings include normal foveal contour.   Notes   Very minor micro-CME may be seen in the inner perifoveal regions but these are not pathologic, from examination 2 weeks prior yet these appear to have be slightly improved On high-definition OCT today in addition to improved thickening OU with only a 1 week use of excellent fitting BiPAP,  I encourage patient to continue on the BiPAP/CPAP whichever mask  works best                  ASSESSMENT/PLAN:  Retinal telangiectasis of both eyes Only 2 weeks after resumption of BiPAP, yet patient only has had a good fitting mask for 1 week.  Nonetheless no change in his overall symptoms of diffuse blurring as yet the patient does have an improved macular condition in each eye by OCT measures.  The patient also has complaints of intermittent diplopia near the end of the day  Patient is subsub-potentially noticing that he has less blurred vision after more longer sustained sleep.  Still using the BiPAP.  This does suggest there may have been some role for macular hypoxia at night  triggering some blurred vision upon awakening which improves with oxygenation of proper breathing while awake  Lattice degeneration of peripheral retina, left No holes or tires      ICD-10-CM   1. Retinal telangiectasis of both eyes  H35.073 OCT, Retina - OU - Both Eyes  2. Lattice degeneration of peripheral retina, left  H35.412     1.  Overall improved blurred vision, less occurrence upon awakening and for shorter duration as compared to prior to use of nightly BiPAP.  Thus sudden onset effectively, 1 week, may be triggering an improved overall sense of blurred vision as we hoped and anticipated by restoration of nightly supplementation of BiPAP/ CPAP.  We will continue to we will continue to monitor  2.  Patient also reports that he has not an app that monitors his nightly oxygen even with his BiPAP and he still has periodic drops below 90% sats.  He is following up with his sleep study Dr. For further consideration  3.  Ophthalmic Meds Ordered this visit:  No orders of the defined types were placed in this encounter.      Return in about 8 weeks (around 07/16/2020) for DILATE OU, OCT.  There are no Patient Instructions on file for this visit.   Explained the diagnoses, plan, and follow up with the patient and they expressed understanding.  Patient  expressed understanding of the importance of proper follow up care.   Clent Demark Pablo Mathurin M.D. Diseases & Surgery of the Retina and Vitreous Retina & Diabetic [place] 05/21/20     Abbreviations: M myopia (nearsighted); A astigmatism; H hyperopia (farsighted); P presbyopia; Mrx spectacle prescription;  CTL contact lenses; OD right eye; OS left eye; OU both eyes  XT exotropia; ET esotropia; PEK punctate epithelial keratitis; PEE punctate epithelial erosions; DES dry eye syndrome; MGD meibomian gland dysfunction; ATs artificial tears; PFAT's preservative free artificial tears; [place] nuclear sclerotic cataract; PSC posterior subcapsular cataract; ERM epi-retinal membrane; PVD posterior vitreous detachment; RD retinal detachment; DM diabetes mellitus; DR diabetic retinopathy; NPDR non-proliferative diabetic retinopathy; PDR proliferative diabetic retinopathy; CSME clinically significant macular edema; DME diabetic macular edema; dbh dot blot hemorrhages; CWS cotton wool spot; POAG primary open angle glaucoma; C/D cup-to-disc ratio; HVF humphrey visual field; GVF goldmann visual field; OCT optical coherence tomography; IOP intraocular pressure; BRVO Branch retinal vein occlusion; CRVO central retinal vein occlusion; CRAO central retinal artery occlusion; BRAO branch retinal artery occlusion; RT retinal tear; SB scleral buckle; PPV pars plana vitrectomy; VH Vitreous hemorrhage; PRP panretinal laser photocoagulation; IVK intravitreal kenalog; VMT vitreomacular traction; MH Macular hole;  NVD neovascularization of the disc; NVE neovascularization elsewhere; AREDS age related eye disease study; ARMD age related macular degeneration; POAG primary open angle glaucoma; EBMD epithelial/anterior basement membrane dystrophy; ACIOL anterior chamber intraocular lens; IOL intraocular lens; PCIOL posterior chamber intraocular lens; Phaco/IOL phacoemulsification with intraocular lens placement; [place] photorefractive keratectomy;  LASIK laser assisted in situ keratomileusis; HTN hypertension; DM diabetes mellitus; COPD chronic obstructive pulmonary disease

## 2020-05-21 NOTE — Assessment & Plan Note (Signed)
No holes or tires

## 2020-05-21 NOTE — Assessment & Plan Note (Addendum)
Only 2 weeks after resumption of BiPAP, yet patient only has had a good fitting mask for 1 week.  Nonetheless no change in his overall symptoms of diffuse blurring as yet the patient does have an improved macular condition in each eye by OCT measures.  The patient also has complaints of intermittent diplopia near the end of the day  Patient is subsub-potentially noticing that he has less blurred vision after more longer sustained sleep.  Still using the BiPAP.  This does suggest there may have been some role for macular hypoxia at night triggering some blurred vision upon awakening which improves with oxygenation of proper breathing while awake

## 2020-05-21 NOTE — Progress Notes (Signed)
[name]   [phone] Fax:([phone]) [phone]   Clinic Follow up Note   Patient Care Team: Shawnee Knapp, MD as PCP - General (Family Medicine) Sanda Klein, MD as PCP - Cardiology (Cardiology) Bernadene Bell, MD (Inactive) as Consulting Physician (Hematology) Sheryn Bison, MD as Referring Physician (Dermatology) Elease Hashimoto, Trixie Deis, MD as Referring Physician (Gastroenterology) [name], MD as Consulting Physician (Hematology) Rozetta Nunnery, MD as Consulting Physician (Otolaryngology) Doree Fudge, PhD as Consulting Physician (Psychology) Lendon Colonel, MD as Referring Physician (Psychiatry) Meredith Staggers, MD as Consulting Physician (Physical Medicine and Rehabilitation) Hermelinda Medicus, MD as Consulting Physician (Rheumatology) [name], MD as Consulting Physician (Orthopedic Surgery) Justice Britain, MD as Consulting Physician (Orthopedic Surgery) Hollice Espy, MD as Consulting Physician (Urology) Shawnee Knapp, MD (Family Medicine)  Date of Service:  05/24/2020  CHIEF COMPLAINT: F/u on DVT and polycythemia  CURRENT THERAPY:  Xarelto 20 mg once daily, phlebotomy if hematocrit above 46%  INTERVAL HISTORY:  Jesse Bowers is here for a follow up of DVT. He presents to the clinic alone. He came in a automatic wheelchair, he still has join pain and does not walk a lot  He had multiple small orthopedic surgeries in b/l wrists in the past year and may need more surgery on right wrist  He has been seeing speech therapist, retina specialist, and cardiologist Dr. Claiborne Billings for sleep apnea and he is using Bipap when he sleeps  He is able to do all ADLs, he does Yoga, walking, aquatic exercise etc  Mild dyspnea on exertion   All other systems were reviewed with the patient and are negative.  MEDICAL HISTORY:  Past Medical History:  Diagnosis Date  . Abnormal weight loss   . Anxiety   . Arthritis   . Cataract    OU  . Celiac  disease   . Cervical neck pain with evidence of disc disease    patient has a cyst   . Chronic constipation   . Chronic diastolic heart failure ([place])    Pt. denies  . Chronic pain   . Degenerative disc disease at L5-S1 level    with stenosis  . DVT (deep venous thrombosis) ([facility])    Right upper arm, bilateral leg  . Eczema    inguinal, feet  . Elevated liver enzymes   . 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 Family history of adverse reaction to anesthesia    family has problems with anesthesia of nausea and vomiting   . 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 GERD (gastroesophageal reflux disease)    History of in 20's  . Gluten enteropathy   . H/O parotitis    right   . Hard of hearing   . History of kidney stones   . History of retinal tear    Bilateral  . History of staph infection    required wound vac  . Hx-TIA (transient ischemic attack)    2015  . LVH (left ventricular hypertrophy) 12/15/2016   Mild, noted on ECHO  . MVP (mitral valve prolapse)   . NAFL (nonalcoholic fatty liver)   . Neck pain   . Neuromuscular disorder ([place])    bilateral neuropathy feet.  . Nuclear sclerotic cataract of both eyes 09/08/2019  . Pneumonia 12/17/2010  . Polycythemia   . Polycythemia, secondary   . PONV (postoperative nausea and vomiting)   . 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 Anne Fu  . Psoriasis  16 X10 cm psoriatic rash on sole of left foot ; open and occ scant bleeding;   . psoriatic arthritis   . PTSD (post-traumatic stress disorder)   . Scaphoid fracture of wrist 09/23/2013  . Seizure ([place])    childhood, medication until age 17 then weanned completely off  . Sleep apnea    split night study last done by Dr. [name] 06/18/15 shows severe OSA, CSA, and hypersomnia, rec bipap  . Splenomegaly   . Stenosis of ureteropelvic junction (UPJ)    left  . Stroke [facility])    CVA vs TIA in left cerebrum causing slight right sided weakness-Dr. [name] follows  . 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 Tachycardia    hx of   . Transfusion history    past history- none recent, after surgeries due to blood loss  . Wears glasses   . Wears hearing aid     SURGICAL HISTORY: Past Surgical History:  Procedure Laterality Date  . ABDOMINAL HYSTERECTOMY Bilateral 1994   TAH, BSO- tranverse incision at 55 yo  . ANKLE ARTHROSCOPY WITH RECONSTRUCTION Right 2007  . CHOLECYSTECTOMY     laparoscopic  . COLONOSCOPY     x3  . EYE SURGERY     Left eye 03/02/2018, right 02/15/2018  . HIP ARTHROSCOPY W/ LABRAL REPAIR Right 05/11/2013   acetabular labral tear 03/30/2013  . KNEE ARTHROPLASTY Right   . KNEE JOINT MANIPULATION Left    x3 under anesthesia  . KNEE SURGERY Bilateral 1984   Right ACL, left PCL repair  . LITHOTRIPSY  2005  . LIVER BIOPSY  2013   normal results.  [name] MASTECTOMY Bilateral    prior to 2009  . MOUTH SURGERY    . NASAL SEPTUM SURGERY N/A 09/20/2015   by ENT Dr. [name]  . OVARIAN CYST SURGERY Left    size of grapefruit, was informed that she had shortened vagina  . SHOULDER SURGERY Bilateral    Right 08/15/2016, Left 11/15/2016  . THUMB ARTHROSCOPY Left   . THYROIDECTOMY, PARTIAL Left 2008  . TOTAL KNEE ARTHROPLASTY Right 08/23/2018   Procedure: TOTAL KNEE ARTHROPLASTY;  Surgeon: [name], MD;  Location: [facility];  Service: Orthopedics;  Laterality: Right;  63mn  . TOTAL KNEE REVISION Left 02/06/2016   Procedure: LEFT TOTAL KNEE REVISION;  Surgeon: FGaynelle Arabian MD;  Location: [facility];  Service: Orthopedics;  Laterality: Left;  . TOTAL KNEE REVISION Left 04/22/2017   Procedure: Left knee polyethylene revision;  Surgeon: AGaynelle Arabian MD;  Location: [facility];  Service: Orthopedics;  Laterality: Left;  . UPPER GI ENDOSCOPY  2003    I have reviewed the social history and family history with the patient and they are unchanged from previous note.  ALLERGIES:  is allergic to penicillin g, sulfa antibiotics, tegaderm ag mesh  [silver], ibuprofen, pregabalin, and sulfacetamide sodium-sulfur.  MEDICATIONS:  Current Outpatient Medications  Medication Sig Dispense Refill  . acyclovir ointment (ZOVIRAX) 5 % as needed.    . baclofen (LIORESAL) 20 MG tablet Take 1 tablet (20 mg total) by mouth 4 (four) times daily. 120 tablet 3  . Calcipotriene-Betameth Diprop 0.005-0.064 % FOAM Apply 1 application topically 2 (two) times daily as needed.     . clobetasol (TEMOVATE) 0.05 % GEL Apply 1 application topically 2 (two) times daily as needed (itching).    . clorazepate (TRANXENE) 15 MG tablet Take 15 mg by mouth at bedtime.    . clorazepate (  TRANXENE) 7.5 MG tablet Take 7.5 mg by mouth 2 (two) times daily.    . COSENTYX SENSOREADY, 300 MG, 150 MG/ML SOAJ Inject 2 Syringes into the skin every 28 (twenty-eight) days.    . Cyanocobalamin (VITAMIN B-12) 5000 MCG SUBL Place 5,000 mcg under the tongue daily.    [name] desonide (DESOWEN) 0.05 % ointment Apply 1 application topically 2 (two) times daily as needed (psoriasis).   2  . ergocalciferol (VITAMIN D2) 1.25 MG (50000 UT) capsule Take 1 capsule by mouth once a week.    . finasteride (PROPECIA) 1 MG tablet Take 1 mg by mouth daily.    . Fluocinolone Acetonide 0.01 % OIL Place 3 drops into both ears 2 (two) times daily as needed for itching.    . folic acid (FOLVITE) 1 MG tablet Take 1 mg by mouth daily.    . furosemide (LASIX) 20 MG tablet Take 1 tablet (20 mg total) by mouth every morning. 90 tablet 3  . furosemide (LASIX) 40 MG tablet Take 40 mg by mouth every morning.    . linaclotide (LINZESS) 145 MCG CAPS capsule Take 1 capsule (145 mcg total) by mouth daily before breakfast. 30 capsule 3  . methotrexate (RHEUMATREX) 2.5 MG tablet Take 10 mg by mouth once a week. Caution:Chemotherapy. Protect from light.    . morphine (KADIAN) 60 MG 24 hr capsule Take 1 capsule (60 mg total) by mouth every 12 (twelve) hours. 60 capsule 0  . neomycin-polymyxin-hydrocortisone (CORTISPORIN)  3.5-10000-1 OTIC suspension Place 4 drops into both ears 2 (two) times daily as needed (ear pain).   1  . Oxycodone HCl 10 MG TABS Take 1 tablet (10 mg total) by mouth every 8 (eight) hours as needed. 70 tablet 0  . potassium chloride (KLOR-CON) 10 MEQ tablet TAKE 2 TABLETS BY MOUTH EVERY DAY 180 tablet 3  . rivaroxaban (XARELTO) 20 MG TABS tablet TAKE 1 TABLET(20 MG) BY MOUTH EVERY NIGHT AT BEDTIME 90 tablet 3  . testosterone cypionate (DEPOTESTOSTERONE CYPIONATE) 200 MG/ML injection INJECT 0.5 MLS (100 MG TOTAL) INTO THE MUSCLE ONCE A WEEK. 10 mL 0  . ursodiol (ACTIGALL) 250 MG tablet Take 250 mg by mouth 2 (two) times daily with a meal.     . ursodiol (ACTIGALL) 500 MG tablet Take 1 tablet midday with lunch and 1 tablet in the evening with dinneer    . venlafaxine XR (EFFEXOR-XR) 150 MG 24 hr capsule Take 2 capsules (300 mg total) by mouth daily with breakfast. 60 capsule 5   No current facility-administered medications for this visit.    PHYSICAL EXAMINATION: ECOG PERFORMANCE STATUS: 3 - Symptomatic, >50% confined to bed  Vitals:   05/24/20 1313  BP: 118/82  Pulse: 92  Resp: 17  Temp: 98.8 F (37.1 C)  SpO2: 97%   Filed Weights   05/24/20 1313  Weight: 224 lb (101.6 kg)    GENERAL:alert, no distress and comfortable SKIN: skin color, texture, turgor are normal, no rashes or significant lesions EYES: normal, Conjunctiva are pink and non-injected, sclera clear Musculoskeletal:no cyanosis of digits and no clubbing  NEURO: alert & oriented x 3 with fluent speech, no focal motor/sensory deficits  LABORATORY DATA:  I have reviewed the data as listed CBC Latest Ref Rng & Units 05/24/2020 09/28/2019 05/23/2019  WBC 4.0 - 10.5 K/uL 3.3(L) 4.0 3.2(L)  Hemoglobin 13.0 - 17.0 g/dL 14.2 12.8(L) 12.2(L)  Hematocrit 39.0 - 52.0 % 45.1 42.3 41.6  Platelets 150 - 400 K/uL 161 203 215  CMP Latest Ref Rng & Units 09/28/2019 02/23/2019 08/25/2018  Glucose 70 - 99 mg/dL 101(H) 104(H) 138(H)   BUN 6 - 20 mg/dL 13 14 13   Creatinine 0.61 - 1.24 mg/dL 1.29(H) 1.09 0.84  Sodium 135 - 145 mmol/L 138 138 133(L)  Potassium 3.5 - 5.1 mmol/L 4.3 3.9 3.6  Chloride 98 - 111 mmol/L 101 102 99  CO2 22 - 32 mmol/L 28 26 23   Calcium 8.9 - 10.3 mg/dL 9.7 8.8(L) 8.1(L)  Total Protein 6.5 - 8.1 g/dL 6.9 7.0 -  Total Bilirubin 0.3 - 1.2 mg/dL 0.6 0.5 -  Alkaline Phos 38 - 126 U/L 193(H) 227(H) -  AST 15 - 41 U/L 20 23 -  ALT 0 - 44 U/L 21 22 -      RADIOGRAPHIC STUDIES: I have personally reviewed the radiological images as listed and agreed with the findings in the report. No results found.   ASSESSMENT & PLAN:  Jesse Bowers is a 55 y.o. adult with   1. Polycythemia JAK2 (-) -Per patient, he has had polycythemia since his 23s. His bone marrow biopsy in 2016 was unremarkable, JAK2 mutation was negative  -heis a transgender.He receives testosterone injections weekly. He currently undergoes phlebotomy as needed. -Patient feels better after phlebotomy, he would like to useHct 46 or abovefor phlebotomy, which is reasonable.We discussed phlebotomy induced iron deficiency, which he already has. -Labs reviewed today, Hct 45.1%. No need or phlebotomy today.  -Will monitor with labs every 3-4 months with phlebotomy for Hct>46%. F/u in 1 year or sooner if more concerns.   2. History of DVT and stroke, Protein C deficiency -Currently on Xarelto and will continue indefinitely.  -This was held for 4 days for his right knee surgery. He did have mild to moderate bleeding from surgery.  -I encouraged him to avoid fall or injury.   3History of CVA  4. 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 Brigitte Pulse -He know to keephis testosterone level low, due to previous history of DVT and stroke  5.RA -Currently onSecukinumab (COSENTYX). Continue -He notes his Psoriasis is flaring due to treatment. This is present on scalp, nose, groin and genitals.  -He  underwent right wrist surgery for [facility] cyst on 05/11/19. He does still have joint pain there. He may have another one this year if indicated.   6.History ofTelangiectasia macularis eruptiva perstans(TMEP)  -per pt, he was diagnosed in [facility] in 2012, he presented with skin rash, and the biopsy confirmed. I do not have records available. He has no typic skin presentation of TMEP now  -I do not see any clinical evidence of systemic mastocytosis.  -will monitor  7. Gynecomastia -s/p bilateral mastectomies14 years ago.  8. S/p right knee replacement and multiple orthopedic surgeries  -Done in 08/2018 -He has healed well and still undergoingneuro PT, OT. -He can ambulatemildlywith walker. He is still uses wheelchair just as much. -he had multiple orthopedic surgeries on bilateral wrists in the past year   Plan -Labs reviewed, no phlebotomy today  -Lab and phlebotomy for HCT>46% in 3, 7 and 11 months -F/u in 11 months    No problem-specific Assessment & Plan notes found for this encounter.   No orders of the defined types were placed in this encounter.  All questions were answered. The patient knows to call the clinic with any problems, questions or concerns. No barriers to learning was detected. The total time spent in the appointment was 20 minutes.  [name], MD 05/24/2020   I, Joslyn Devon, am acting as scribe for [name], MD.   I have reviewed the above documentation for accuracy and completeness, and I agree with the above.

## 2020-05-23 ENCOUNTER — Ambulatory Visit: Payer: BC Managed Care – PPO

## 2020-05-23 ENCOUNTER — Other Ambulatory Visit: Payer: BC Managed Care – PPO

## 2020-05-23 ENCOUNTER — Ambulatory Visit: Payer: BC Managed Care – PPO | Admitting: Psychology

## 2020-05-23 ENCOUNTER — Ambulatory Visit: Payer: BC Managed Care – PPO | Admitting: Hematology

## 2020-05-23 MED ORDER — FUROSEMIDE 20 MG PO TABS: 20.0000 mg | ORAL_TABLET | Freq: Every morning | ORAL | 3 refills | Status: DC

## 2020-05-23 MED ORDER — URSODIOL 500 MG PO TABS: ORAL_TABLET | ORAL | Status: DC

## 2020-05-24 ENCOUNTER — Inpatient Hospital Stay: Payer: BC Managed Care – PPO | Attending: Hematology

## 2020-05-24 ENCOUNTER — Inpatient Hospital Stay: Payer: BC Managed Care – PPO | Admitting: Hematology

## 2020-05-24 ENCOUNTER — Ambulatory Visit (INDEPENDENT_AMBULATORY_CARE_PROVIDER_SITE_OTHER): Payer: BC Managed Care – PPO | Admitting: Psychology

## 2020-05-24 ENCOUNTER — Inpatient Hospital Stay: Payer: BC Managed Care – PPO

## 2020-05-24 ENCOUNTER — Other Ambulatory Visit: Payer: Self-pay

## 2020-05-24 VITALS — BP 118/82 | HR 92 | Temp 98.8°F | Resp 17 | Ht 69.0 in | Wt 224.0 lb

## 2020-05-24 DIAGNOSIS — Z79899 Other long term (current) drug therapy: Secondary | ICD-10-CM | POA: Diagnosis not present

## 2020-05-24 DIAGNOSIS — F4312 Post-traumatic stress disorder, chronic: Secondary | ICD-10-CM | POA: Diagnosis not present

## 2020-05-24 DIAGNOSIS — F4323 Adjustment disorder with mixed anxiety and depressed mood: Secondary | ICD-10-CM | POA: Diagnosis not present

## 2020-05-24 DIAGNOSIS — Z86718 Personal history of other venous thrombosis and embolism: Secondary | ICD-10-CM | POA: Insufficient documentation

## 2020-05-24 DIAGNOSIS — D45 Polycythemia vera: Secondary | ICD-10-CM | POA: Diagnosis not present

## 2020-05-24 DIAGNOSIS — Z7901 Long term (current) use of anticoagulants: Secondary | ICD-10-CM | POA: Diagnosis not present

## 2020-05-24 DIAGNOSIS — D751 Secondary polycythemia: Secondary | ICD-10-CM | POA: Diagnosis present

## 2020-05-24 LAB — CBC WITH DIFFERENTIAL/PLATELET
Abs Immature Granulocytes: 0 10*3/uL (ref 0.00–0.07)
Basophils Absolute: 0 10*3/uL (ref 0.0–0.1)
Basophils Relative: 0 %
Eosinophils Absolute: 0 10*3/uL (ref 0.0–0.5)
Eosinophils Relative: 0 %
HCT: 45.1 % (ref 39.0–52.0)
Hemoglobin: 14.2 g/dL (ref 13.0–17.0)
Immature Granulocytes: 0 %
Lymphocytes Relative: 19 %
Lymphs Abs: 0.6 10*3/uL — ABNORMAL LOW (ref 0.7–4.0)
MCH: 26.2 pg (ref 26.0–34.0)
MCHC: 31.5 g/dL (ref 30.0–36.0)
MCV: 83.2 fL (ref 80.0–100.0)
Monocytes Absolute: 0.3 10*3/uL (ref 0.1–1.0)
Monocytes Relative: 9 %
Neutro Abs: 2.3 10*3/uL (ref 1.7–7.7)
Neutrophils Relative %: 72 %
Platelets: 161 10*3/uL (ref 150–400)
RBC: 5.42 MIL/uL (ref 4.22–5.81)
RDW: 16.4 % — ABNORMAL HIGH (ref 11.5–15.5)
WBC: 3.3 10*3/uL — ABNORMAL LOW (ref 4.0–10.5)
nRBC: 0 % (ref 0.0–0.2)

## 2020-05-24 LAB — COMPREHENSIVE METABOLIC PANEL
ALT: 29 U/L (ref 0–44)
AST: 29 U/L (ref 15–41)
Albumin: 4.4 g/dL (ref 3.5–5.0)
Alkaline Phosphatase: 211 U/L — ABNORMAL HIGH (ref 38–126)
Anion gap: 9 (ref 5–15)
BUN: 17 mg/dL (ref 6–20)
CO2: 29 mmol/L (ref 22–32)
Calcium: 9.1 mg/dL (ref 8.9–10.3)
Chloride: 101 mmol/L (ref 98–111)
Creatinine, Ser: 1.16 mg/dL (ref 0.61–1.24)
GFR, Estimated: >60 mL/min (ref >60)
Glucose, Bld: 117 mg/dL — ABNORMAL HIGH (ref 70–99)
Potassium: 4.1 mmol/L (ref 3.5–5.1)
Sodium: 139 mmol/L (ref 135–145)
Total Bilirubin: 0.7 mg/dL (ref 0.3–1.2)
Total Protein: 7 g/dL (ref 6.5–8.1)

## 2020-05-25 ENCOUNTER — Ambulatory Visit: Payer: BC Managed Care – PPO

## 2020-05-25 ENCOUNTER — Encounter: Payer: Self-pay | Admitting: Hematology

## 2020-05-25 DIAGNOSIS — R4701 Aphasia: Secondary | ICD-10-CM | POA: Diagnosis not present

## 2020-05-25 DIAGNOSIS — R41841 Cognitive communication deficit: Secondary | ICD-10-CM

## 2020-05-25 NOTE — Therapy (Signed)
[facility] [address] [place], [place], [address] Phone: [phone]   Fax:  [phone]  Speech Language Pathology Treatment  Patient Details  Name: Jesse Bowers MRN: [id number] Date of Birth: 01-26-1966 Referring Provider (SLP): Alger Simons, MD   Encounter Date: 05/25/2020   End of Session - 05/25/20 1507    Visit Number 3    Number of Visits 12    Date for SLP Re-Evaluation 07/29/20    SLP Start Time 1446    SLP Stop Time  9924    SLP Time Calculation (min) 44 min    Activity Tolerance Patient tolerated treatment well           Past Medical History:  Diagnosis Date  . Abnormal weight loss   . Anxiety   . Arthritis   . Cataract    OU  . Celiac disease   . Cervical neck pain with evidence of disc disease    patient has a cyst   . Chronic constipation   . Chronic diastolic heart failure ([place])    Pt. denies  . Chronic pain   . Degenerative disc disease at L5-S1 level    with stenosis  . DVT (deep venous thrombosis) (HCC)    Right upper arm, bilateral leg  . Eczema    inguinal, feet  . Elevated liver enzymes   . Failed total knee arthroplasty ([place]) 04/22/2017  . Family history of adverse reaction to anesthesia    family has problems with anesthesia of nausea and vomiting   . 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 GERD (gastroesophageal reflux disease)    History of in 20's  . Gluten enteropathy   . H/O parotitis    right   . Hard of hearing   . History of kidney stones   . History of retinal tear    Bilateral  . History of staph infection    required wound vac  . Hx-TIA (transient ischemic attack)    2015  . LVH (left ventricular hypertrophy) 12/15/2016   Mild, noted on ECHO  . MVP (mitral valve prolapse)   . NAFL (nonalcoholic fatty liver)   . Neck pain   . Neuromuscular disorder ([place])    bilateral neuropathy feet.  . Nuclear sclerotic cataract of both eyes 09/08/2019  . Pneumonia  12/17/2010  . Polycythemia   . Polycythemia, secondary   . PONV (postoperative nausea and vomiting)   . Protein C deficiency ([place])    Dr. Anne Fu  . Psoriasis    16 X10 cm psoriatic rash on sole of left foot ; open and occ scant bleeding;   . psoriatic arthritis   . PTSD (post-traumatic stress disorder)   . Scaphoid fracture of wrist 09/23/2013  . Seizure ([place])    childhood, medication until age 31 then weanned completely off  . Sleep apnea    split night study last done by Dr. [name] 06/18/15 shows severe OSA, CSA, and hypersomnia, rec bipap  . Splenomegaly   . Stenosis of ureteropelvic junction (UPJ)    left  . Stroke [facility])    CVA vs TIA in left cerebrum causing slight right sided weakness-Dr. [name] follows  . 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 Tachycardia    hx of   . Transfusion history    past history- none recent, after surgeries due to blood loss  . Wears glasses   . Wears hearing aid  Past Surgical History:  Procedure Laterality Date  . ABDOMINAL HYSTERECTOMY Bilateral 1994   TAH, BSO- tranverse incision at 55 yo  . ANKLE ARTHROSCOPY WITH RECONSTRUCTION Right 2007  . CHOLECYSTECTOMY     laparoscopic  . COLONOSCOPY     x3  . EYE SURGERY     Left eye 03/02/2018, right 02/15/2018  . HIP ARTHROSCOPY W/ LABRAL REPAIR Right 05/11/2013   acetabular labral tear 03/30/2013  . KNEE ARTHROPLASTY Right   . KNEE JOINT MANIPULATION Left    x3 under anesthesia  . KNEE SURGERY Bilateral 1984   Right ACL, left PCL repair  . LITHOTRIPSY  2005  . LIVER BIOPSY  2013   normal results.  [name] MASTECTOMY Bilateral    prior to 2009  . MOUTH SURGERY    . NASAL SEPTUM SURGERY N/A 09/20/2015   by ENT Dr. [name]  . OVARIAN CYST SURGERY Left    size of grapefruit, was informed that she had shortened vagina  . SHOULDER SURGERY Bilateral    Right 08/15/2016, Left 11/15/2016  . THUMB ARTHROSCOPY Left   . THYROIDECTOMY, PARTIAL Left 2008  . TOTAL KNEE  ARTHROPLASTY Right 08/23/2018   Procedure: TOTAL KNEE ARTHROPLASTY;  Surgeon: [name], MD;  Location: [facility];  Service: Orthopedics;  Laterality: Right;  21mn  . TOTAL KNEE REVISION Left 02/06/2016   Procedure: LEFT TOTAL KNEE REVISION;  Surgeon: FGaynelle Arabian MD;  Location: [facility];  Service: Orthopedics;  Laterality: Left;  . TOTAL KNEE REVISION Left 04/22/2017   Procedure: Left knee polyethylene revision;  Surgeon: AGaynelle Arabian MD;  [facility];  Service: Orthopedics;  Laterality: Left;  . UPPER GI ENDOSCOPY  2003    There were no vitals filed for this visit.   Subjective Assessment - 05/25/20 1617    Subjective "I'm so sorry I missed the last session" with full explanation    Currently in Pain? No/denies                 ADULT SLP TREATMENT - 05/25/20 1447      General Information   Behavior/Cognition Alert;Cooperative;Pleasant mood      Treatment Provided   Treatment provided Cognitive-Linquistic      Cognitive-Linquistic Treatment   Treatment focused on Cognition;Patient/family/caregiver education;Aphasia    Skilled Treatment Pt noted with occasional anomia in mod complex conversation. SLP educated pt on word finding strategies and conversatoinal techniques to maintain focus, as pt frequently "rambles" and loses topic during tangent. SLP suggested pt use visual cues to aid topic maintenance. Pt reports using external memory aids with good success, including recording longer important conversations if he is not able to write notes.      Assessment / Recommendations / Plan   Plan Continue with current plan of care      Progression Toward Goals   Progression toward goals Progressing toward goals            SLP Education - 05/25/20 1621    Education Details word finding strategies, topic maintenance, non-verbal cues    Person(s) Educated Patient    Methods Explanation;Demonstration;Handout    Comprehension Verbalized understanding;Returned  demonstration;Need further instruction              SLP Long Term Goals - 05/25/20 1448      SLP LONG TERM GOAL #1   Title Verbalize daily schedule, recent events and salient information using external memory aids    Baseline 05-25-20    Time --    Period --  or 9 total visits, for all LTGs   Status Achieved      SLP LONG TERM GOAL #2   Title pt will demonstrate understanding of 10 minutes of conversational discourse or other auditory stimuli using compensatory techniques in 3 sessions    Time 7    Period Weeks    Status On-going      SLP LONG TERM GOAL #3   Title pt will demonstrate understanding/report understanding of written cues to perform household duties and tasks with 100% accuracy x2 sessions    Time 7    Period Weeks    Status On-going      SLP LONG TERM GOAL #4   Title pt will demonstrate appropriate topic maintenance in 10 minutes conversation, with compensatory measures x3 sessions    Time 7    Period Weeks    Status On-going      SLP LONG TERM GOAL #5   Title pt's score on QOL measure for communication will increase to >18    Time 7    Period Weeks    Status On-going            Plan - 05/25/20 1622    Clinical Impression Statement Garfield presents today with evidence of receptive and expressive language deficits from a lt CVA in 2015. SLP introduced word finding strategies, given occasional anomia demonstrated this session. SLP attempted to introduce SFA and VNeST; however, task was deferred secondary to time constraints. SLP reviewed strategies and techniques for topic maintenance provided last session, with pt benefitting from visual aids and non-verbal cues to maintain topic effectively.  Pt will benefit from skiled ST targeting language and cognition necessary for effective communication.    Speech Therapy Frequency 1x /week    Duration 8 weeks    Treatment/Interventions Compensatory techniques;Functional tasks;Environmental controls;Multimodal  communcation approach;Language facilitation;SLP instruction and feedback;Patient/family education;Internal/external aids;Cognitive reorganization    Potential to Achieve Goals Good    Potential Considerations Severity of impairments;Other (comment)    SLP Home Exercise Plan provided    Consulted and Agree with Plan of Care Patient           Patient will benefit from skilled therapeutic intervention in order to improve the following deficits and impairments:   Aphasia  Cognitive communication deficit    Problem List Patient Active Problem List   Diagnosis Date Noted  . Pseudophakia of both eyes 05/07/2020  . Retinal telangiectasis of both eyes 05/07/2020  . Lattice degeneration of peripheral retina, left 09/08/2019  . Lattice degeneration, right eye 09/08/2019  . Chronic diastolic heart failure ([place]) 05/24/2019  . Urinary dysfunction 04/12/2019  . Osteoarthritis of right knee 08/23/2018  . Constipation due to opioid therapy 03/30/2018  . SNHL (sensorineural hearing loss) 12/04/2017  . Abnormal urinary stream 12/03/2017  . Osteoarthritis of carpometacarpal (CMC) joint of thumb 11/30/2017  . Muscle weakness 11/17/2017  . Transient vision disturbance 11/12/2017  . Bilateral hand pain 10/30/2017  . Pain of left hip joint 10/09/2017  . Gynecomastia 07/10/2017  . Iron deficiency anemia 07/05/2017  . Spasticity 05/20/2017  . Lumbar radiculitis 04/20/2017  . Ulnar 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, left 11/28/2016  . Idiopathic peripheral neuropathy 11/28/2016  . 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 Long term (current) use of anticoagulants 08/23/2015  . Right upper quadrant abdominal pain 08/23/2015  . Memory loss 05/10/2015  . Gait abnormality 04/07/2015  . Alkaline phosphatase elevation 04/07/2015  . History of thrombosis 03/26/2015  . Medial epicondylitis 02/07/2015  .  Cognitive decline 12/21/2014  . Leukopenia 12/05/2014  . Rotator cuff syndrome of right shoulder  10/27/2014  . Status post left knee replacement 08/22/2014  . Left lateral epicondylitis 08/22/2014  . Chronic cerebral ischemia 08/18/2014  . Arthrofibrosis of knee joint 08/17/2014  . Cubital canal compression syndrome, left 08/17/2014  . 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 Chronic pain syndrome 04/10/2014  . Insomnia 04/10/2014  . Chronic non-specific white matter lesions on MRI 04/10/2014  . CFS (chronic fatigue syndrome) 04/10/2014  . 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 Biceps tendonitis on left 03/01/2014  . Polycythemia vera ([place]) 12/27/2013  . H/O TIA (transient ischemic attack) and stroke 12/27/2013  . Neck pain 12/27/2013  . OSA (obstructive sleep apnea) 12/08/2013  . 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 Depression with anxiety 08/04/2013  . Protein C deficiency ([place]) 08/01/2013  . Post traumatic stress disorder (PTSD) 08/01/2013  . Speech abnormality 07/25/2013  . Obesity 05/10/2013  . Lower extremity edema 05/10/2013  . GERD (gastroesophageal reflux disease) 05/10/2013  . Arthritis 05/10/2013  . OA (osteoarthritis) of knee 03/15/2013  . Headache 10/25/2012  . Palpitations 10/18/2012  . Fatty liver determined by biopsy 06/01/2012  . Arthropathic psoriasis, unspecified ([place]) 04/29/2012  . Abnormal liver enzymes 03/29/2012  . Psoriatic arthritis ([place]) 12/29/2011  . Left Renal Hydronephrosis 12/11/2010  . 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 Celiac disease 05/27/2010  . Thyroid nodule 05/27/2010  . Male-to-male transgender person 09/20/2002    Alinda Deem, MA CCC-SLP 05/25/2020, 4:25 PM  [facility] [address] [place], [place], [address] Phone: [phone]   Fax:  [phone]   Name: Abdo Denault MRN: [id number] Date of Birth: 1965/04/19

## 2020-05-25 NOTE — Patient Instructions (Addendum)
° °  Look through word finding strategies to determine what is helpful to you  Continue to practice 1 minute conversations to target staying on topic and summarizing conversation in efficient manner

## 2020-05-28 ENCOUNTER — Ambulatory Visit: Payer: BC Managed Care – PPO | Admitting: Psychology

## 2020-05-30 ENCOUNTER — Encounter: Payer: Self-pay | Admitting: Physical Medicine & Rehabilitation

## 2020-05-30 ENCOUNTER — Ambulatory Visit: Payer: BC Managed Care – PPO | Admitting: Psychology

## 2020-05-30 ENCOUNTER — Other Ambulatory Visit: Payer: Self-pay

## 2020-05-30 ENCOUNTER — Encounter
Payer: BC Managed Care – PPO | Attending: Physical Medicine & Rehabilitation | Admitting: Physical Medicine & Rehabilitation

## 2020-05-30 VITALS — BP 141/85 | HR 107 | Temp 98.2°F | Ht 69.0 in | Wt 223.0 lb

## 2020-05-30 DIAGNOSIS — M5416 Radiculopathy, lumbar region: Secondary | ICD-10-CM | POA: Diagnosis present

## 2020-05-30 DIAGNOSIS — R269 Unspecified abnormalities of gait and mobility: Secondary | ICD-10-CM | POA: Diagnosis present

## 2020-05-30 DIAGNOSIS — Z5181 Encounter for therapeutic drug level monitoring: Secondary | ICD-10-CM | POA: Insufficient documentation

## 2020-05-30 DIAGNOSIS — T402X5A Adverse effect of other opioids, initial encounter: Secondary | ICD-10-CM | POA: Insufficient documentation

## 2020-05-30 DIAGNOSIS — L4050 Arthropathic psoriasis, unspecified: Secondary | ICD-10-CM | POA: Insufficient documentation

## 2020-05-30 DIAGNOSIS — R252 Cramp and spasm: Secondary | ICD-10-CM | POA: Insufficient documentation

## 2020-05-30 DIAGNOSIS — Z79891 Long term (current) use of opiate analgesic: Secondary | ICD-10-CM | POA: Insufficient documentation

## 2020-05-30 DIAGNOSIS — G894 Chronic pain syndrome: Secondary | ICD-10-CM | POA: Insufficient documentation

## 2020-05-30 DIAGNOSIS — G609 Hereditary and idiopathic neuropathy, unspecified: Secondary | ICD-10-CM | POA: Insufficient documentation

## 2020-05-30 DIAGNOSIS — K5903 Drug induced constipation: Secondary | ICD-10-CM | POA: Insufficient documentation

## 2020-05-30 MED ORDER — MORPHINE SULFATE ER 60 MG PO CP24: 60.0000 mg | ORAL_CAPSULE | Freq: Two times a day (BID) | ORAL | 0 refills | Status: DC

## 2020-05-30 MED ORDER — BACLOFEN 20 MG PO TABS: 20.0000 mg | ORAL_TABLET | Freq: Four times a day (QID) | ORAL | 3 refills | Status: DC

## 2020-05-30 MED ORDER — LINACLOTIDE 290 MCG PO CAPS: 290.0000 ug | ORAL_CAPSULE | Freq: Every day | ORAL | 3 refills | Status: DC

## 2020-05-30 MED ORDER — OXYCODONE HCL 10 MG PO TABS: 10.0000 mg | ORAL_TABLET | Freq: Three times a day (TID) | ORAL | 0 refills | Status: DC | PRN

## 2020-05-30 NOTE — Patient Instructions (Signed)
PLEASE FEEL FREE TO CALL OUR OFFICE WITH ANY PROBLEMS OR QUESTIONS ([phone])      

## 2020-05-30 NOTE — Progress Notes (Signed)
Subjective:    Patient ID: Jesse Bowers, male    DOB: January 27, 1966, 55 y.o.   MRN: [id number]  HPI Jesse Bowers here in follow-up of his chronic pain as well as his functional deficits. He is now right thumb replacement surgery scheduled 4/18 by Dr. Apolonio Schneiders. He tells me that mtx will be increased post-operatively once his jt has healed as he has had ongoing jt pain which is progressing. His hands, back and feet are the areas which bother him most.   He remains on oxycodone 25m q8 prn and kadian [id number] for pain control. He has been taking oxycodone q12 prn on a lot of days. This provides him some quality of life.  His bowels have remained more regular with linzess but are still not where he wants to be. He's interested in increasing the dose if possible.    Pain Inventory Average Pain 5 Pain Right Now 5 My pain is constant, sharp, burning, dull, stabbing, tingling and aching  LOCATION OF PAIN  Neck, wrist, fingers, back, hip, knee, ankle, toes  BOWEL Number of stools per week: 1 Oral laxative use Yes  Type of laxative linzess Enema or suppository use No  History of colostomy No  Incontinent No   BLADDER Normal In and out cath, frequency n//a Able to self cath n/a Bladder incontinence No  Frequent urination Yes  Leakage with coughing No  Difficulty starting stream No  Incomplete bladder emptying No    Mobility walk with assistance use a walker ability to climb steps?  yes do you drive?  yes use a wheelchair Do you have any goals in this area?  yes  Function disabled: date disabled . I need assistance with the following:  dressing, meal prep, household duties and shopping Do you have any goals in this area?  yes  Neuro/Psych weakness numbness tremor tingling trouble walking spasms depression anxiety  Prior Studies Any changes since last visit?  no  Physicians involved in your care Any changes since last visit?  no   Family History  Problem Relation Age  of Onset  . Stroke Maternal Grandfather        [id number]. Heart attack Maternal Grandfather   . Glaucoma Maternal Grandfather   . Macular degeneration Maternal Grandfather   . Breast cancer Sister   . Hypertension Mother   . Psoriasis Mother   . Other Mother        meningioma developed ~2019  . Glaucoma Mother   . Cancer Paternal Grandfather   . Heart attack Paternal Grandfather   . Stroke Paternal Uncle        age 55. Polycythemia Paternal Uncle   . Stroke Maternal Grandmother   . Congestive Heart Failure Maternal Grandmother   . Heart attack Maternal Grandmother   . Protein C deficiency Sister 3634     Miscarriages  . Breast cancer Maternal Aunt [id number] Social History   Socioeconomic History  . Marital status: Married    Spouse name: Not on file  . Number of children: 2  . Years of education: 4y college  . Highest education level: Not on file  Occupational History  . Occupation: Pediatric Nurse practitioner    Comment: Not working since [id number]  Tobacco Use  . Smoking status: Never Smoker  . Smokeless tobacco: Never Used  Vaping Use  . Vaping Use: Never used  Substance and Sexual Activity  . Alcohol use: Yes    Comment: social  .  Drug use: No  . Sexual activity: Yes    Birth control/protection: None    Comment: patient is a transgender on testosterone shots, no biological kids  Other Topics Concern  . Not on file  Social History Narrative   Education 4 year college, former Therapist, sports X 15 years, pediatric nurse practitioner x 6 years, did NP degree from [employer] of [place]. Relocated to [place] about 2 months ago from [place], [place]. Patient was in [place] for last 4 years and prior to that in [place]. His wife is working as Scientist, research (physical sciences) for [employer]. Patient is not working and applying for disability. They have 2 kids but no biologic children.    Social Determinants of Health   Financial Resource Strain: Not on file  Food Insecurity: Not on file  Transportation  Needs: Not on file  Physical Activity: Not on file  Stress: Not on file  Social Connections: Not on file   Past Surgical History:  Procedure Laterality Date  . ABDOMINAL HYSTERECTOMY Bilateral 1994   TAH, BSO- tranverse incision at 55 yo  . ANKLE ARTHROSCOPY WITH RECONSTRUCTION Right 2007  . CHOLECYSTECTOMY     laparoscopic  . COLONOSCOPY     x3  . EYE SURGERY     Left eye 03/02/2018, right 02/15/2018  . HIP ARTHROSCOPY W/ LABRAL REPAIR Right 05/11/2013   acetabular labral tear 03/30/2013  . KNEE ARTHROPLASTY Right   . KNEE JOINT MANIPULATION Left    x3 under anesthesia  . KNEE SURGERY Bilateral 1984   Right ACL, left PCL repair  . LITHOTRIPSY  2005  . LIVER BIOPSY  2013   normal results.  [name] MASTECTOMY Bilateral    prior to 2009  . MOUTH SURGERY    . NASAL SEPTUM SURGERY N/A 09/20/2015   by ENT Dr. [name]  . OVARIAN CYST SURGERY Left    size of grapefruit, was informed that she had shortened vagina  . SHOULDER SURGERY Bilateral    Right 08/15/2016, Left 11/15/2016  . THUMB ARTHROSCOPY Left   . THYROIDECTOMY, PARTIAL Left 2008  . TOTAL KNEE ARTHROPLASTY Right 08/23/2018   Procedure: TOTAL KNEE ARTHROPLASTY;  Surgeon: [name], MD;  [facility];  Service: Orthopedics;  Laterality: Right;  88mn  . TOTAL KNEE REVISION Left 02/06/2016   Procedure: LEFT TOTAL KNEE REVISION;  Surgeon: FGaynelle Arabian MD;  [facility];  Service: Orthopedics;  Laterality: Left;  . TOTAL KNEE REVISION Left 04/22/2017   Procedure: Left knee polyethylene revision;  Surgeon: AGaynelle Arabian MD;  [facility];  Service: Orthopedics;  Laterality: Left;  . UPPER GI ENDOSCOPY  2003   Past Medical History:  Diagnosis Date  . Abnormal weight loss   . Anxiety   . Arthritis   . Cataract    OU  . Celiac disease   . Cervical neck pain with evidence of disc disease    patient has a cyst   . Chronic constipation   . Chronic diastolic heart failure ([place]    Pt. denies  . Chronic pain    . Degenerative disc disease at L5-S1 level    with stenosis  . DVT (deep venous thrombosis) (HCC)    Right upper arm, bilateral leg  . Eczema    inguinal, feet  . Elevated liver enzymes   . Failed [place]plasty ([place] 04/22/2017  . Family history of adverse reaction to anesthesia    family has problems with anesthesia of nausea and vomiting   . Male-to-male transgender  person   . GERD (gastroesophageal reflux disease)    History of in 20's  . Gluten enteropathy   . H/O parotitis    right   . Hard of hearing   . History of kidney stones   . History of retinal tear    Bilateral  . History of staph infection    required wound vac  . Hx-TIA (transient ischemic attack)    2015  . LVH (left ventricular hypertrophy) 12/15/2016   Mild, noted on ECHO  . MVP (mitral valve prolapse)   . NAFL (nonalcoholic fatty liver)   . Neck pain   . Neuromuscular disorder ([place])    bilateral neuropathy feet.  . Nuclear sclerotic cataract of both eyes 09/08/2019  . Pneumonia 12/17/2010  . Polycythemia   . Polycythemia, secondary   . PONV (postoperative nausea and vomiting)   . Protein C deficiency ([place])    Dr. Anne Fu  . Psoriasis    16 X10 cm psoriatic rash on sole of left foot ; open and occ scant bleeding;   . psoriatic arthritis   . PTSD (post-traumatic stress disorder)   . Scaphoid fracture of wrist 09/23/2013  . Seizure ([place])    childhood, medication until age 38 then weanned completely off  . Sleep apnea    split night study last done by Dr. [name] 06/18/15 shows severe OSA, CSA, and hypersomnia, rec bipap  . Splenomegaly   . Stenosis of ureteropelvic junction (UPJ)    left  . Stroke [facility])    CVA vs TIA in left cerebrum causing slight right sided weakness-Dr. [name] follows  . 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 Tachycardia    hx of   . Transfusion history    past history- none recent, after surgeries due to blood loss  . Wears glasses   .  Wears hearing aid    BP (!) 141/85   Pulse (!) 107   Temp 98.2 F (36.8 C)   Ht 5' 9"  (1.753 m)   Wt 223 lb (101.2 kg)   SpO2 94%   BMI 32.93 kg/m   Opioid Risk Score:   Fall Risk Score:  `1  Depression screen PHQ 2/9  Depression screen PHQ 2/9 03/21/2020  Decreased Interest 0  Down, Depressed, Hopeless 0  PHQ - 2 Score 0  Some recent data might be hidden    Review of Systems  Constitutional: Negative.   HENT: Negative.   Eyes: Negative.   Respiratory: Positive for apnea and shortness of breath.   Cardiovascular: Negative.   Gastrointestinal: Positive for constipation.  Endocrine: Negative.   Genitourinary: Negative.   Musculoskeletal: Positive for arthralgias, back pain, gait problem, myalgias, neck pain and neck stiffness.       Spasms  Skin: Negative.   Allergic/Immunologic: Negative.   Neurological: Positive for tremors, weakness and numbness.       Tingling   Psychiatric/Behavioral: Positive for dysphoric mood. The patient is nervous/anxious.   All other systems reviewed and are negative.      Objective:   Physical Exam  General: No acute distress HEENT: EOMI, oral membranes moist Cards: reg rate  Chest: normal effort Abdomen: Soft, NT, ND Skin: dry, intact Extremities: no edema Psych: pleasant and appropriate Neuro: Pt is pleasant and alert and oriented x 3.   Strength is grossly 3-4/5 based on pain, splint.   reflexies are hyperactive. patchy sensory loss in legs..   Musculoskeletal:  right thumb  tender  right elbow pain at extensor compartment persists     Assessment & Plan:   1. Psoriatic arthritis with pain in multiple areas, most prominently feet, hands, elbows. His pain is also related to his prior CVA and associated motor/sensory change   2. 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  3. Hx of left total  knee revision again on 04/21/17 per [facility] and right TKA 08/2018 4. Chronic  low back pain---MRI with severe DDD at L5-S1. Left S1 radiculopathy? S/p LEFT L5-S1 ESI 5. Protein C deficiency   6. Left shoulder subluxation, bicipital tendonitis  7. Central sleep apnea  8. Polycythemia  9. Depression with anxiety.  10. Left lateral epidondylitis  11. 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  left sided weakness can fluctuate. Most recent cervical MRI (07/08/17) without significant change 12. 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 [place] 14. Right wrist psoriatic arthritis, tendonapthy, ganglion cysts s/p multiple recent surgeries.      Plan:  1. Continue effexor 379m xr daily  2. right thumb jt replacement surgery by ortho next month. 3. Voltaren gel to hands, feet, knees, elbows--- continue 4. Continue baclofen for lower extermity spasms/back pain. 20 mg 4 times daily #120.  5. Refilled  kadian [id number] #60 and oxycodone 1036m70 again today We will continue the controlled substance monitoring program, this consists of regular clinic visits, examinations, routine drug screening, pill counts as well [place] use [place].   -consider further taper depending upon how his right elbow is doing and starting new mtx dose -will allow increased oxycodone post-op. He will call with dose adjustment by ortho.  -drug swab today     6.   forearm compressive ban, ice in short term to help support extensor tendon          7. Has had good results with left L5-S1 translaminar injection per Dr. KirGeorge Hughother injection if needed 8: OI constipation             -increase linzess to 290 mg daily   Fifteen minutes of face to face patient care time were spent during this visit. All questions were encouraged and answered.  Follow up with me in 2 mos .

## 2020-05-31 ENCOUNTER — Ambulatory Visit
Admission: RE | Admit: 2020-05-31 | Discharge: 2020-05-31 | Disposition: A | Discharge status: Home / Self Care | Payer: BC Managed Care – PPO | Source: Ambulatory Visit | Attending: Family Medicine | Admitting: Family Medicine

## 2020-05-31 DIAGNOSIS — R7401 Elevation of levels of liver transaminase levels: Secondary | ICD-10-CM

## 2020-05-31 DIAGNOSIS — R1012 Left upper quadrant pain: Secondary | ICD-10-CM

## 2020-05-31 DIAGNOSIS — R109 Unspecified abdominal pain: Secondary | ICD-10-CM

## 2020-06-01 ENCOUNTER — Other Ambulatory Visit: Payer: Self-pay

## 2020-06-01 ENCOUNTER — Ambulatory Visit: Payer: BC Managed Care – PPO | Attending: Physical Medicine & Rehabilitation

## 2020-06-01 DIAGNOSIS — R41841 Cognitive communication deficit: Secondary | ICD-10-CM | POA: Diagnosis present

## 2020-06-01 DIAGNOSIS — R4701 Aphasia: Secondary | ICD-10-CM | POA: Diagnosis present

## 2020-06-01 NOTE — Therapy (Signed)
Jesse Bowers [address] [place], [place], [address] Phone: [phone]   Fax:  [phone]  Speech Language Pathology Treatment  Patient Details  Name: Jesse Bowers MRN: [id number] Date of Birth: [birth year] Referring Provider (SLP): Alger Simons, MD   Encounter Date: 06/01/2020   End of Session - 06/01/20 55 3    Visit Number 4    Number of Visits 12    Date for SLP Re-Evaluation 07/29/20    SLP Start Time 34    SLP Stop Time  1450    SLP Time Calculation (min) 42 min    Activity Tolerance Patient tolerated treatment well           Past Medical History:  Diagnosis Date  . Abnormal weight loss   . Anxiety   . Arthritis   . Cataract    OU  . Celiac disease   . Cervical neck pain with evidence of disc disease    patient has a cyst   . Chronic constipation   . Chronic diastolic heart failure ([place])    Pt. denies  . Chronic pain   . Degenerative disc disease at L5-S1 level    with stenosis  . DVT (deep venous thrombosis) (HCC)    Right upper arm, bilateral leg  . Eczema    inguinal, feet  . Elevated liver enzymes   . 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 Family history of adverse reaction to anesthesia    family has problems with anesthesia of nausea and vomiting   . 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 GERD (gastroesophageal reflux disease)    History of in 20's  . Gluten enteropathy   . H/O parotitis    right   . Hard of hearing   . History of kidney stones   . History of retinal tear    Bilateral  . History of staph infection    required wound vac  . Hx-TIA (transient ischemic attack)    2015  . LVH (left ventricular hypertrophy) 12/15/2016   Mild, noted on ECHO  . MVP (mitral valve prolapse)   . NAFL (nonalcoholic fatty liver)   . Neck pain   . Neuromuscular disorder ([place])    bilateral neuropathy feet.  . Nuclear sclerotic cataract of both eyes 09/08/2019  . Pneumonia  12/17/2010  . Polycythemia   . Polycythemia, secondary   . PONV (postoperative nausea and vomiting)   . Protein C deficiency ([place])    Dr. Anne Fu  . Psoriasis    16 X10 cm psoriatic rash on sole of left foot ; open and occ scant bleeding;   . psoriatic arthritis   . PTSD (post-traumatic stress disorder)   . Scaphoid fracture of wrist 09/23/2013  . Seizure ([place])    childhood, medication until age 24 then weanned completely off  . Sleep apnea    split night study last done by Dr. [name] 06/18/15 shows severe OSA, CSA, and hypersomnia, rec bipap  . Splenomegaly   . Stenosis of ureteropelvic junction (UPJ)    left  . Stroke [facility])    CVA vs TIA in left cerebrum causing slight right sided weakness-Dr. [name] follows  . Syrinx of spinal cord ([place]) 01/06/2014   c spine on MRI  . Tachycardia    hx of   . Transfusion history    past history- none recent, after surgeries due to blood loss  . Wears glasses   . Wears hearing aid  Past Surgical History:  Procedure Laterality Date  . ABDOMINAL HYSTERECTOMY Bilateral 1994   TAH, BSO- tranverse incision at 55 yo  . ANKLE ARTHROSCOPY WITH RECONSTRUCTION Right 2007  . CHOLECYSTECTOMY     laparoscopic  . COLONOSCOPY     x3  . EYE SURGERY     Left eye 03/02/2018, right 02/15/2018  . HIP ARTHROSCOPY W/ LABRAL REPAIR Right 05/11/2013   acetabular labral tear 03/30/2013  . KNEE ARTHROPLASTY Right   . KNEE JOINT MANIPULATION Left    x3 under anesthesia  . KNEE SURGERY Bilateral 1984   Right ACL, left PCL repair  . LITHOTRIPSY  2005  . LIVER BIOPSY  2013   normal results.  [name] MASTECTOMY Bilateral    prior to 2009  . MOUTH SURGERY    . NASAL SEPTUM SURGERY N/A 09/20/2015   by ENT Dr. [name]  . OVARIAN CYST SURGERY Left    size of grapefruit, was informed that she had shortened vagina  . SHOULDER SURGERY Bilateral    Right 08/15/2016, Left 11/15/2016  . THUMB ARTHROSCOPY Left   . THYROIDECTOMY, PARTIAL Left 2008  . TOTAL KNEE  ARTHROPLASTY Right 08/23/2018   Procedure: TOTAL KNEE ARTHROPLASTY;  Surgeon: [name], MD;  Location: [facility];  Service: Orthopedics;  Laterality: Right;  97mn  . TOTAL KNEE REVISION Left 02/06/2016   Procedure: LEFT TOTAL KNEE REVISION;  Surgeon: FGaynelle Arabian MD;  Location: [facility];  Service: Orthopedics;  Laterality: Left;  . TOTAL KNEE REVISION Left 04/22/2017   Procedure: Left knee polyethylene revision;  Surgeon: AGaynelle Arabian MD;  [facility]: [facility];  Service: Orthopedics;  Laterality: Left;  . UPPER GI ENDOSCOPY  2003    There were no vitals filed for this visit.   Subjective Assessment - 06/01/20 1410    Subjective "it's been a busy day"    Currently in Pain? Yes    Pain Score 7     Pain Location Epigastric    Pain Descriptors / Indicators Aching    Pain Type --                 ADULT SLP TREATMENT - 06/01/20 1453      General Information   Behavior/Cognition Alert;Cooperative;Pleasant mood      Treatment Provided   Treatment provided Cognitive-Linquistic      Cognitive-Linquistic Treatment   Treatment focused on Cognition;Patient/family/caregiver education;Aphasia    Skilled Treatment Pt presented with increased halting speech and occasional anomia this session. Pt able to accurately self-cue anomic episodes with use of description strategy and use of additional processing time. Pt ID'd he arrived late this session. Pt reports he set wrong ST appointment time in phone as pt normally gives himself a 15 minute gap. Pt aware of error and verbalized how to correct it. Pt noted with occasional deviations in topics requiring SLP redirection and clarification. Pt overall does exhibit improved awareness of topic shifting, with benefit of occasional verbal and visual cues to provide concluding sentences. Pt described scenario in which pt requested assistance to appropriately communciate with medical professional. SLP recommended pt use visual aid to organize thought process  and pre-plan to reduce unncessary information. Pt verbalized understanding and agreement with need to pre-plan and edit multiple times to ensure concise and effective communication.      Assessment / Recommendations / Plan   Plan Continue with current plan of care      Progression Toward Goals   Progression toward goals Progressing toward goals  SLP Education - 06/01/20 1504    Education Details pre-planning, thought organization, practicing concluding sentences    Person(s) Educated Patient    Methods Explanation;Demonstration    Comprehension Verbalized understanding;Returned demonstration;Need further instruction              SLP Long Term Goals - 06/01/20 1436      SLP LONG TERM GOAL #1   Title Verbalize daily schedule, recent events and salient information using external memory aids    Baseline 05-25-20    Period --   or 9 total visits, for all LTGs   Status Achieved      SLP LONG TERM GOAL #2   Title pt will demonstrate understanding of 10 minutes of conversational discourse or other auditory stimuli using compensatory techniques in 3 sessions    Time 7    Period Weeks    Status On-going      SLP LONG TERM GOAL #3   Title pt will demonstrate understanding/report understanding of written cues to perform household duties and tasks with 100% accuracy x2 sessions    Baseline 06-01-20    Time 7    Period Weeks    Status On-going      SLP LONG TERM GOAL #4   Title pt will demonstrate appropriate topic maintenance in 10 minutes conversation, with compensatory measures x3 sessions    Time 7    Period Weeks    Status On-going      SLP LONG TERM GOAL #5   Title pt's score on QOL measure for communication will increase to >18    Time 7    Period Weeks    Status On-going            Plan - 06/01/20 1506    Clinical Impression Statement Osiris presents today with evidence of receptive and expressive language deficits from a lt CVA in 2015. Pt demonstrated  increased halted speech and occasional anomia this session. SLP reviewed strategies and techniques for topic maintenance provided last session, with pt benefitting from visual aids and non-verbal cues to maintain topic effectively. SLP recommended that patient pre-plan, organize thought process, and edit to reduce lengthy discourse and inclusion of uncessary information. Pt will benefit from skiled ST targeting language and cognition necessary for effective communication.    Speech Therapy Frequency 1x /week    Duration 8 weeks   or 9 total visits   Treatment/Interventions Compensatory techniques;Functional tasks;Environmental controls;Multimodal communcation approach;Language facilitation;SLP instruction and feedback;Patient/family education;Internal/external aids;Cognitive reorganization    Potential to Achieve Goals Good    Potential Considerations Co-morbidities    SLP Home Exercise Plan provided    Consulted and Agree with Plan of Care Patient           Patient will benefit from skilled therapeutic intervention in order to improve the following deficits and impairments:   Aphasia  Cognitive communication deficit    Problem List Patient Active Problem List   Diagnosis Date Noted  . Pseudophakia of both eyes 05/07/2020  . Retinal telangiectasis of both eyes 05/07/2020  . Lattice degeneration of peripheral retina, left 09/08/2019  . Lattice degeneration, right eye 09/08/2019  . Chronic diastolic heart failure ([place]) 05/24/2019  . Urinary dysfunction 04/12/2019  . Osteoarthritis of right knee 08/23/2018  . Constipation due to opioid therapy 03/30/2018  . SNHL (sensorineural hearing loss) 12/04/2017  . Abnormal urinary stream 12/03/2017  . Osteoarthritis of carpometacarpal (CMC) joint of thumb 11/30/2017  . Muscle weakness 11/17/2017  . Transient vision disturbance  11/12/2017  . Bilateral hand pain 10/30/2017  . Pain of left hip joint 10/09/2017  . Gynecomastia 07/10/2017  .  Iron deficiency anemia 07/05/2017  . Spasticity 05/20/2017  . Lumbar radiculitis 04/20/2017  . Ulnar 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, left 11/28/2016  . Idiopathic peripheral neuropathy 11/28/2016  . 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 Long term (current) use of anticoagulants 08/23/2015  . Right upper quadrant abdominal pain 08/23/2015  . Memory loss 05/10/2015  . Gait abnormality 04/07/2015  . Alkaline phosphatase elevation 04/07/2015  . History of thrombosis 03/26/2015  . Medial epicondylitis 02/07/2015  . Cognitive decline 12/21/2014  . Leukopenia 12/05/2014  . Rotator cuff syndrome of right shoulder 10/27/2014  . Status post left knee replacement 08/22/2014  . Left lateral epicondylitis 08/22/2014  . Chronic cerebral ischemia 08/18/2014  . Arthrofibrosis of knee joint 08/17/2014  . Cubital canal compression syndrome, left 08/17/2014  . 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 Chronic pain syndrome 04/10/2014  . Insomnia 04/10/2014  . Chronic non-specific white matter lesions on MRI 04/10/2014  . CFS (chronic fatigue syndrome) 04/10/2014  . 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 Biceps tendonitis on left 03/01/2014  . Polycythemia vera ([place]) 12/27/2013  . H/O TIA (transient ischemic attack) and stroke 12/27/2013  . Neck pain 12/27/2013  . OSA (obstructive sleep apnea) 12/08/2013  . 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 Depression with anxiety 08/04/2013  . Protein C deficiency ([place]) 08/01/2013  . Post traumatic stress disorder (PTSD) 08/01/2013  . Speech abnormality 07/25/2013  . Obesity 05/10/2013  . Lower extremity edema 05/10/2013  . GERD (gastroesophageal reflux disease) 05/10/2013  . Arthritis 05/10/2013  . OA (osteoarthritis) of knee 03/15/2013  . Headache 10/25/2012  . Palpitations 10/18/2012  . Fatty liver determined by biopsy 06/01/2012  . Arthropathic psoriasis, unspecified ([place]) 04/29/2012  . Abnormal liver enzymes 03/29/2012  . Psoriatic  arthritis ([place]) 12/29/2011  . Left Renal Hydronephrosis 12/11/2010  . 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 Celiac disease 05/27/2010  . Thyroid nodule 05/27/2010  . Male-to-male transgender person 09/20/2002    Alinda Deem, [place] CCC-SLP 06/01/2020, 3:11 PM  [facility] [address] [place], [place], [address] Phone: [phone]   Fax:  [phone]   Name: Maxton Noreen MRN: [id number] Date of Birth: January 31, 1966

## 2020-06-02 LAB — DRUG TOX MONITOR 1 W/CONF, ORAL FLD
Alprazolam: NEGATIVE ng/mL (ref <0.50)
Amphetamines: NEGATIVE ng/mL (ref <10)
Barbiturates: NEGATIVE ng/mL (ref <10)
Benzodiazepines: POSITIVE ng/mL — AB (ref <0.50)
Buprenorphine: NEGATIVE ng/mL (ref <0.10)
Chlordiazepoxide: NEGATIVE ng/mL (ref <0.50)
Clonazepam: NEGATIVE ng/mL (ref <0.50)
Cocaine: NEGATIVE ng/mL (ref <5.0)
Codeine: NEGATIVE ng/mL (ref <2.5)
Diazepam: NEGATIVE ng/mL (ref <0.50)
Dihydrocodeine: NEGATIVE ng/mL (ref <2.5)
Fentanyl: NEGATIVE ng/mL (ref <0.10)
Flunitrazepam: NEGATIVE ng/mL (ref <0.50)
Flurazepam: NEGATIVE ng/mL (ref <0.50)
Heroin Metabolite: NEGATIVE ng/mL (ref <1.0)
Hydrocodone: NEGATIVE ng/mL (ref <2.5)
Hydromorphone: NEGATIVE ng/mL (ref <2.5)
Lorazepam: NEGATIVE ng/mL (ref <0.50)
MARIJUANA: NEGATIVE ng/mL (ref <2.5)
MDMA: NEGATIVE ng/mL (ref <10)
Meprobamate: NEGATIVE ng/mL (ref <2.5)
Methadone: NEGATIVE ng/mL (ref <5.0)
Midazolam: NEGATIVE ng/mL (ref <0.50)
Morphine: 39.7 ng/mL — ABNORMAL HIGH (ref <2.5)
Nicotine Metabolite: NEGATIVE ng/mL (ref <5.0)
Nordiazepam: 3.14 ng/mL — ABNORMAL HIGH (ref <0.50)
Norhydrocodone: NEGATIVE ng/mL (ref <2.5)
Noroxycodone: 5.6 ng/mL — ABNORMAL HIGH (ref <2.5)
Opiates: POSITIVE ng/mL — AB (ref <2.5)
Oxazepam: NEGATIVE ng/mL (ref <0.50)
Oxycodone: 7 ng/mL — ABNORMAL HIGH (ref <2.5)
Oxymorphone: NEGATIVE ng/mL (ref <2.5)
Phencyclidine: NEGATIVE ng/mL (ref <10)
Tapentadol: NEGATIVE ng/mL (ref <5.0)
Temazepam: NEGATIVE ng/mL (ref <0.50)
Tramadol: NEGATIVE ng/mL (ref <5.0)
Triazolam: NEGATIVE ng/mL (ref <0.50)
Zolpidem: NEGATIVE ng/mL (ref <5.0)

## 2020-06-02 LAB — DRUG TOX ALC METAB W/CON, ORAL FLD: Alcohol Metabolite: NEGATIVE ng/mL (ref <25)

## 2020-06-04 ENCOUNTER — Ambulatory Visit: Payer: BC Managed Care – PPO | Admitting: Psychology

## 2020-06-06 ENCOUNTER — Other Ambulatory Visit: Payer: Self-pay

## 2020-06-06 ENCOUNTER — Ambulatory Visit: Payer: BC Managed Care – PPO | Admitting: Psychology

## 2020-06-06 ENCOUNTER — Ambulatory Visit: Payer: BC Managed Care – PPO

## 2020-06-06 DIAGNOSIS — R41841 Cognitive communication deficit: Secondary | ICD-10-CM

## 2020-06-06 DIAGNOSIS — R4701 Aphasia: Secondary | ICD-10-CM

## 2020-06-06 NOTE — Therapy (Signed)
[name] [address] [place], [place], [address] Phone: [phone]   Fax:  [phone]  Speech Language Pathology Treatment  Patient Details  Name: Jesse Bowers MRN: [id number] Date of Birth: 05-27-1965 Referring Provider (SLP): Alger Simons, MD   Encounter Date: 06/06/2020   End of Session - 06/06/20 1316    Visit Number 5    Number of Visits 12    Date for SLP Re-Evaluation 07/29/20    SLP Start Time 1157    SLP Stop Time  2    SLP Time Calculation (min) 33 min    Activity Tolerance Patient tolerated treatment well           Past Medical History:  Diagnosis Date  . Abnormal weight loss   . Anxiety   . Arthritis   . Cataract    OU  . Celiac disease   . Cervical neck pain with evidence of disc disease    patient has a cyst   . Chronic constipation   . Chronic diastolic heart failure ([place])    Pt. denies  . Chronic pain   . Degenerative disc disease at L5-S1 level    with stenosis  . DVT (deep venous thrombosis) (HCC)    Right upper arm, bilateral leg  . Eczema    inguinal, feet  . Elevated liver enzymes   . 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 Family history of adverse reaction to anesthesia    family has problems with anesthesia of nausea and vomiting   . 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 GERD (gastroesophageal reflux disease)    History of in 20's  . Gluten enteropathy   . H/O parotitis    right   . Hard of hearing   . History of kidney stones   . History of retinal tear    Bilateral  . History of staph infection    required wound vac  . Hx-TIA (transient ischemic attack)    2015  . LVH (left ventricular hypertrophy) 12/15/2016   Mild, noted on ECHO  . MVP (mitral valve prolapse)   . NAFL (nonalcoholic fatty liver)   . Neck pain   . Neuromuscular disorder ([place])    bilateral neuropathy feet.  . Nuclear sclerotic cataract of both eyes 09/08/2019  . Pneumonia  12/17/2010  . Polycythemia   . Polycythemia, secondary   . PONV (postoperative nausea and vomiting)   . Protein C deficiency ([place])    Dr. Anne Fu  . Psoriasis    16 X10 cm psoriatic rash on sole of left foot ; open and occ scant bleeding;   . psoriatic arthritis   . PTSD (post-traumatic stress disorder)   . Scaphoid fracture of wrist 09/23/2013  . Seizure ([place])    childhood, medication until age 10 then weanned completely off  . Sleep apnea    split night study last done by Dr. [name] 06/18/15 shows severe OSA, CSA, and hypersomnia, rec bipap  . Splenomegaly   . Stenosis of ureteropelvic junction (UPJ)    left  . Stroke [facility])    CVA vs TIA in left cerebrum causing slight right sided weakness-Dr. [name] follows  . Syrinx of spinal cord ([place]) 01/06/2014   c spine on MRI  . Tachycardia    hx of   . Transfusion history    past history- none recent, after surgeries due to blood loss  . Wears glasses   . Wears hearing aid  Past Surgical History:  Procedure Laterality Date  . ABDOMINAL HYSTERECTOMY Bilateral 1994   TAH, BSO- tranverse incision at 55 yo  . ANKLE ARTHROSCOPY WITH RECONSTRUCTION Right 2007  . CHOLECYSTECTOMY     laparoscopic  . COLONOSCOPY     x3  . EYE SURGERY     Left eye 03/02/2018, right 02/15/2018  . HIP ARTHROSCOPY W/ LABRAL REPAIR Right 05/11/2013   acetabular labral tear 03/30/2013  . KNEE ARTHROPLASTY Right   . KNEE JOINT MANIPULATION Left    x3 under anesthesia  . KNEE SURGERY Bilateral 1984   Right ACL, left PCL repair  . LITHOTRIPSY  2005  . LIVER BIOPSY  2013   normal results.  [name] MASTECTOMY Bilateral    prior to 2009  . MOUTH SURGERY    . NASAL SEPTUM SURGERY N/A 09/20/2015   by ENT Dr. [name]  . OVARIAN CYST SURGERY Left    size of grapefruit, was informed that she had shortened vagina  . SHOULDER SURGERY Bilateral    Right 08/15/2016, Left 11/15/2016  . THUMB ARTHROSCOPY Left   . THYROIDECTOMY, PARTIAL Left 2008  . TOTAL KNEE  ARTHROPLASTY Right 08/23/2018   Procedure: TOTAL KNEE ARTHROPLASTY;  Surgeon: [name], MD;  [facility];  Service: Orthopedics;  Laterality: Right;  72mn  . TOTAL KNEE REVISION Left 02/06/2016   Procedure: LEFT TOTAL KNEE REVISION;  Surgeon: FGaynelle Arabian MD;  [facility];  Service: Orthopedics;  Laterality: Left;  . TOTAL KNEE REVISION Left 04/22/2017   Procedure: Left knee polyethylene revision;  Surgeon: AGaynelle Arabian MD;  [facility];  Service: Orthopedics;  Laterality: Left;  . UPPER GI ENDOSCOPY  2003    There were no vitals filed for this visit.   Subjective Assessment - 06/06/20 1200    Subjective Pt arrived 10 minutes late.                 ADULT SLP TREATMENT - 06/06/20 1201      General Information   Behavior/Cognition Alert;Cooperative;Pleasant mood      Treatment Provided   Treatment provided Cognitive-Linquistic      Cognitive-Linquistic Treatment   Treatment focused on Cognition;Patient/family/caregiver education;Aphasia    Skilled Treatment Pt showed decr'd auditory comprehension in using over-verbosity with texting. SLP used this opportunity to bring up/revisit pragmatics of pt over-verbosity and over-talking" in a conversation, which wears listener out and increases listener burnout as a result. SLP used observation of CFranquezwith front office person. Long discussion about pt's inappropriate use of pragmatic aspect of language. SLP planned with pt to have no more than three statements with scheduler post-ST today as he is scheduling more appointments today. Homework to email SLP three times prior to next visit 06-12-20 with no more than 3 sentences about the most poigniant thing from his day.      Assessment / Recommendations / Plan   Plan Continue with current plan of care      Progression Toward Goals   Progression toward goals Progressing toward goals            SLP Education - 06/06/20 1315    Education Details pt oververbosity  leads to listener burnout    Person(s) Educated Patient    Methods Explanation    Comprehension Verbalized understanding              SLP Long Term Goals - 06/06/20 1317      SLP LONG TERM GOAL #1   Title Verbalize daily schedule,  recent events and salient information using external memory aids    Baseline 05-25-20    Period --   or 9 total visits, for all LTGs   Status Achieved      SLP LONG TERM GOAL #2   Title pt will demonstrate understanding of 10 minutes of conversational discourse or other auditory stimuli using compensatory techniques in 3 sessions    Baseline 06-06-20    Time 6    Period Weeks    Status On-going      SLP LONG TERM GOAL #3   Title pt will demonstrate understanding/report understanding of written cues to perform household duties and tasks with 100% accuracy x2 sessions    Baseline 06-01-20    Time 6    Period Weeks    Status On-going      SLP LONG TERM GOAL #4   Title pt will demonstrate appropriate topic maintenance in 10 minutes conversation, with compensatory measures x3 sessions    Time 6    Period Weeks    Status On-going      SLP LONG TERM GOAL #5   Title pt's score on QOL measure for communication will increase to >18    Time 7    Period Weeks    Status On-going            Plan - 06/06/20 1316    Clinical Impression Statement Jesse Bowers presents today with evidence of receptive and expressive language deficits from a lt CVA in 2015. Pt demonstrated increased halted speech and occasional anomia this session. Pt benefitted from SLP non-verbal cues to maintain topic effectively. SLP recommended that patient reduce lengthy discourse and inclusion of uncessary information. Pt will benefit from skiled ST targeting language and cognition necessary for effective communication.    Speech Therapy Frequency 1x /week    Duration 8 weeks   or 9 total visits   Treatment/Interventions Compensatory techniques;Functional tasks;Environmental controls;Multimodal  communcation approach;Language facilitation;SLP instruction and feedback;Patient/family education;Internal/external aids;Cognitive reorganization    Potential to Achieve Goals Good    Potential Considerations Co-morbidities    SLP Home Exercise Plan provided    Consulted and Agree with Plan of Care Patient           Patient will benefit from skilled therapeutic intervention in order to improve the following deficits and impairments:   Aphasia  Cognitive communication deficit    Problem List Patient Active Problem List   Diagnosis Date Noted  . Pseudophakia of both eyes 05/07/2020  . Retinal telangiectasis of both eyes 05/07/2020  . Lattice degeneration of peripheral retina, left 09/08/2019  . Lattice degeneration, right eye 09/08/2019  . Chronic diastolic heart failure ([place]) 05/24/2019  . Urinary dysfunction 04/12/2019  . Osteoarthritis of right knee 08/23/2018  . Constipation due to opioid therapy 03/30/2018  . SNHL (sensorineural hearing loss) 12/04/2017  . Abnormal urinary stream 12/03/2017  . Osteoarthritis of carpometacarpal (CMC) joint of thumb 11/30/2017  . Muscle weakness 11/17/2017  . Transient vision disturbance 11/12/2017  . Bilateral hand pain 10/30/2017  . Pain of left hip joint 10/09/2017  . Gynecomastia 07/10/2017  . Iron deficiency anemia 07/05/2017  . Spasticity 05/20/2017  . Lumbar radiculitis 04/20/2017  . Ulnar 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, left 11/28/2016  . Idiopathic peripheral neuropathy 11/28/2016  . 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 Long term (current) use of anticoagulants 08/23/2015  . Right upper quadrant abdominal pain 08/23/2015  . Memory loss 05/10/2015  . Gait abnormality 04/07/2015  . Alkaline phosphatase elevation  04/07/2015  . History of thrombosis 03/26/2015  . Medial epicondylitis 02/07/2015  . Cognitive decline 12/21/2014  . Leukopenia 12/05/2014  . Rotator cuff syndrome of right shoulder 10/27/2014  . Status post  left knee replacement 08/22/2014  . Left lateral epicondylitis 08/22/2014  . Chronic cerebral ischemia 08/18/2014  . Arthrofibrosis of knee joint 08/17/2014  . Cubital canal compression syndrome, left 08/17/2014  . 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 Chronic pain syndrome 04/10/2014  . Insomnia 04/10/2014  . Chronic non-specific white matter lesions on MRI 04/10/2014  . CFS (chronic fatigue syndrome) 04/10/2014  . 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 Biceps tendonitis on left 03/01/2014  . Polycythemia vera ([place]) 12/27/2013  . H/O TIA (transient ischemic attack) and stroke 12/27/2013  . Neck pain 12/27/2013  . OSA (obstructive sleep apnea) 12/08/2013  . 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 Depression with anxiety 08/04/2013  . Protein C deficiency ([place]) 08/01/2013  . Post traumatic stress disorder (PTSD) 08/01/2013  . Speech abnormality 07/25/2013  . Obesity 05/10/2013  . Lower extremity edema 05/10/2013  . GERD (gastroesophageal reflux disease) 05/10/2013  . Arthritis 05/10/2013  . OA (osteoarthritis) of knee 03/15/2013  . Headache 10/25/2012  . Palpitations 10/18/2012  . Fatty liver determined by biopsy 06/01/2012  . Arthropathic psoriasis, unspecified ([place]) 04/29/2012  . Abnormal liver enzymes 03/29/2012  . Psoriatic arthritis ([place]) 12/29/2011  . Left Renal Hydronephrosis 12/11/2010  . 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 Celiac disease 05/27/2010  . Thyroid nodule 05/27/2010  . Male-to-male transgender person 09/20/2002    [facility] ,[place], [place]  06/06/2020, 1:19 PM  [facility] [address] [place], [place], [address] Phone: [phone]   Fax:  [phone]   Name: Jesse Bowers MRN: [id number] Date of Birth: 1965-04-10

## 2020-06-07 ENCOUNTER — Ambulatory Visit (INDEPENDENT_AMBULATORY_CARE_PROVIDER_SITE_OTHER): Payer: BC Managed Care – PPO | Admitting: Psychology

## 2020-06-07 DIAGNOSIS — F4323 Adjustment disorder with mixed anxiety and depressed mood: Secondary | ICD-10-CM

## 2020-06-07 DIAGNOSIS — F4312 Post-traumatic stress disorder, chronic: Secondary | ICD-10-CM

## 2020-06-08 ENCOUNTER — Ambulatory Visit: Payer: BC Managed Care – PPO | Admitting: Cardiovascular Disease

## 2020-06-08 ENCOUNTER — Telehealth: Payer: Self-pay | Admitting: *Deleted

## 2020-06-08 NOTE — Telephone Encounter (Signed)
Oral swab drug screen was consistent for prescribed medications.  ?

## 2020-06-09 ENCOUNTER — Other Ambulatory Visit: Payer: Self-pay | Admitting: Physical Medicine & Rehabilitation

## 2020-06-09 DIAGNOSIS — T402X5A Adverse effect of other opioids, initial encounter: Secondary | ICD-10-CM

## 2020-06-09 DIAGNOSIS — K5903 Drug induced constipation: Secondary | ICD-10-CM

## 2020-06-11 ENCOUNTER — Ambulatory Visit: Payer: BC Managed Care – PPO | Admitting: Psychology

## 2020-06-11 NOTE — Telephone Encounter (Signed)
Pharmacy requests alternative drug or prior auth on Linzess.  Prior auth initiated with [employer] via CoverMyMeds.

## 2020-06-12 ENCOUNTER — Ambulatory Visit: Payer: BC Managed Care – PPO

## 2020-06-12 ENCOUNTER — Other Ambulatory Visit: Payer: Self-pay

## 2020-06-12 DIAGNOSIS — R4701 Aphasia: Secondary | ICD-10-CM

## 2020-06-12 DIAGNOSIS — R41841 Cognitive communication deficit: Secondary | ICD-10-CM

## 2020-06-12 NOTE — Therapy (Signed)
[facility] [address] [place], [place], [address] Phone: [phone]   Fax:  [phone]  Speech Language Pathology Treatment  Patient Details  Name: Jesse Bowers MRN: [id number] Date of Birth: February 14, 1966 Referring Provider (SLP): Alger Simons, MD   Encounter Date: 06/12/2020   End of Session - 06/12/20 1732    Visit Number 6    Number of Visits 12    Date for SLP Re-Evaluation 07/29/20    SLP Start Time 11    SLP Stop Time  1145    SLP Time Calculation (min) 40 min    Activity Tolerance Patient tolerated treatment well           Past Medical History:  Diagnosis Date  . Abnormal weight loss   . Anxiety   . Arthritis   . Cataract    OU  . Celiac disease   . Cervical neck pain with evidence of disc disease    patient has a cyst   . Chronic constipation   . Chronic diastolic heart failure ([place])    Pt. denies  . Chronic pain   . Degenerative disc disease at L5-S1 level    with stenosis  . DVT (deep venous thrombosis) (HCC)    Right upper arm, bilateral leg  . Eczema    inguinal, feet  . Elevated liver enzymes   . 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 Family history of adverse reaction to anesthesia    family has problems with anesthesia of nausea and vomiting   . 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 GERD (gastroesophageal reflux disease)    History of in 20's  . Gluten enteropathy   . H/O parotitis    right   . Hard of hearing   . History of kidney stones   . History of retinal tear    Bilateral  . History of staph infection    required wound vac  . Hx-TIA (transient ischemic attack)    2015  . LVH (left ventricular hypertrophy) 12/15/2016   Mild, noted on ECHO  . MVP (mitral valve prolapse)   . NAFL (nonalcoholic fatty liver)   . Neck pain   . Neuromuscular disorder ([place])    bilateral neuropathy feet.  . Nuclear sclerotic cataract of both eyes 09/08/2019  . Pneumonia  12/17/2010  . Polycythemia   . Polycythemia, secondary   . PONV (postoperative nausea and vomiting)   . Protein C deficiency ([place])    Dr. Anne Fu  . Psoriasis    16 X10 cm psoriatic rash on sole of left foot ; open and occ scant bleeding;   . psoriatic arthritis   . PTSD (post-traumatic stress disorder)   . Scaphoid fracture of wrist 09/23/2013  . Seizure ([place])    childhood, medication until age 89 then weanned completely off  . Sleep apnea    split night study last done by Dr. [name] 06/18/15 shows severe OSA, CSA, and hypersomnia, rec bipap  . Splenomegaly   . Stenosis of ureteropelvic junction (UPJ)    left  . Stroke [facility])    CVA vs TIA in left cerebrum causing slight right sided weakness-Dr. [name] follows  . Syrinx of spinal cord ([place]) 01/06/2014   c spine on MRI  . Tachycardia    hx of   . Transfusion history    past history- none recent, after surgeries due to blood loss  . Wears glasses   . Wears hearing aid  Past Surgical History:  Procedure Laterality Date  . ABDOMINAL HYSTERECTOMY Bilateral 1994   TAH, BSO- tranverse incision at 55 yo  . ANKLE ARTHROSCOPY WITH RECONSTRUCTION Right 2007  . CHOLECYSTECTOMY     laparoscopic  . COLONOSCOPY     x3  . EYE SURGERY     Left eye 03/02/2018, right 02/15/2018  . HIP ARTHROSCOPY W/ LABRAL REPAIR Right 05/11/2013   acetabular labral tear 03/30/2013  . KNEE ARTHROPLASTY Right   . KNEE JOINT MANIPULATION Left    x3 under anesthesia  . KNEE SURGERY Bilateral 1984   Right ACL, left PCL repair  . LITHOTRIPSY  2005  . LIVER BIOPSY  2013   normal results.  [name] MASTECTOMY Bilateral    prior to 2009  . MOUTH SURGERY    . NASAL SEPTUM SURGERY N/A 09/20/2015   by ENT Dr. [name]  . OVARIAN CYST SURGERY Left    size of grapefruit, was informed that she had shortened vagina  . SHOULDER SURGERY Bilateral    Right 08/15/2016, Left 11/15/2016  . THUMB ARTHROSCOPY Left   . THYROIDECTOMY, PARTIAL Left 2008  . TOTAL KNEE  ARTHROPLASTY Right 08/23/2018   Procedure: TOTAL KNEE ARTHROPLASTY;  Surgeon: [name], MD;  Location: [facility];  Service: Orthopedics;  Laterality: Right;  56mn  . TOTAL KNEE REVISION Left 02/06/2016   Procedure: LEFT TOTAL KNEE REVISION;  Surgeon: FGaynelle Arabian MD;  Location: [facility];  Service: Orthopedics;  Laterality: Left;  . TOTAL KNEE REVISION Left 04/22/2017   Procedure: Left knee polyethylene revision;  Surgeon: AGaynelle Arabian MD;  Location: [facility];  Service: Orthopedics;  Laterality: Left;  . UPPER GI ENDOSCOPY  2003    There were no vitals filed for this visit.   Subjective Assessment - 06/12/20 1113    Subjective Pt arrived on time. "I have sx next monday."    Currently in Pain? Yes    Pain Location --   thumb   Pain Orientation Right    Pain Descriptors / Indicators Sore;Sharp    Pain Type Chronic pain    Pain Onset More than a month ago    Pain Frequency Constant    Multiple Pain Sites Yes    Pain Score 6    Pain Location Foot   bil   Pain Descriptors / Indicators Aching;Throbbing;Constant    Pain Onset More than a month ago    Pain Frequency Constant                 ADULT SLP TREATMENT - 06/12/20 1118      General Information   Behavior/Cognition Alert;Cooperative;Pleasant mood      Treatment Provided   Treatment provided Cognitive-Linquistic      Cognitive-Linquistic Treatment   Treatment focused on Cognition;Aphasia;Patient/family/caregiver education    Skilled Treatment PT reported that he had a list of things to accomplish yesterday adn attempted all of them but cneters were closed that his wife told him to stop at (which were two of three items on his list). SLP targeted pt's auditory comprehension today. In a 3-4 sentence auditory stimulus, pt chose "true", "false" or "unknown" correctly 70% of the time. Pt demonstrated the ability to use notes to assist his auditory comprehension spontaneously after being encouraged to do so. Between stimuli, pt  began to go off-topic when he stared out the window and continued talking for 15-20 seconds. He responded to nonverbal cue from SLP after 20 seconds of off-topic monologue. Pt demonstrated decr'd impulsivity from off-topic  conversation after that for the next 30 minutes of conversation. SLP encouraged pt to wear rubber band around wrist or some sort of other external cue to remind him to "stay off the rabbit trails".      Assessment / Recommendations / Plan   Plan Continue with current plan of care      Progression Toward Goals   Progression toward goals Progressing toward goals            SLP Education - 06/12/20 1732    Education Details external cue for topic maintenance outside ST session as pt kept appropriate topic (did not go off on "rabbit trails") for ~30 minutes today in session    Person(s) Educated Patient    Methods Explanation    Comprehension Verbalized understanding;Need further instruction              SLP Long Term Goals - 06/12/20 1733      SLP LONG TERM GOAL #1   Title Verbalize daily schedule, recent events and salient information using external memory aids    Baseline 05-25-20    Period --   or 9 total visits, for all LTGs   Status Achieved      SLP LONG TERM GOAL #2   Title pt will demonstrate understanding of 10 minutes of conversational discourse or other auditory stimuli using compensatory techniques in 3 sessions    Baseline 06-06-20, 06-12-20    Time 5    Period Weeks    Status On-going      SLP LONG TERM GOAL #3   Title pt will demonstrate understanding/report understanding of written cues to perform household duties and tasks with 100% accuracy x2 sessions    Baseline 06-01-20, 06-12-20    Time 5    Period Weeks    Status On-going      SLP LONG TERM GOAL #4   Title pt will demonstrate appropriate topic maintenance in 10 minutes conversation, with compensatory measures x3 sessions    Baseline 06-12-20    Time 5    Period Weeks    Status On-going       SLP LONG TERM GOAL #5   Title pt's score on QOL measure for communication will increase to >18    Time 6    Period Weeks    Status On-going            Plan - 06/12/20 1733    Clinical Impression Statement Zaviyar presents today with evidence of receptive language and minimal expressive langauge deficits from a lt CVA in 2015. Pt demonstrated minimal halting speech this session indicative of anomia. Pt benefitted from SLP non-verbal cues to maintain topic effectively. SLP recommended that patient reduce lengthy discourse and inclusion of uncessary information. Pt will benefit from skiled ST targeting language and cognition necessary for effective communication.    Speech Therapy Frequency 1x /week    Duration 8 weeks   or 9 total visits   Treatment/Interventions Compensatory techniques;Functional tasks;Environmental controls;Multimodal communcation approach;Language facilitation;SLP instruction and feedback;Patient/family education;Internal/external aids;Cognitive reorganization    Potential to Achieve Goals Good    Potential Considerations Co-morbidities    SLP Home Exercise Plan provided    Consulted and Agree with Plan of Care Patient           Patient will benefit from skilled therapeutic intervention in order to improve the following deficits and impairments:   Aphasia  Cognitive communication deficit    Problem List Patient Active Problem List   Diagnosis Date  Noted  . Pseudophakia of both eyes 05/07/2020  . Retinal telangiectasis of both eyes 05/07/2020  . Lattice degeneration of peripheral retina, left 09/08/2019  . Lattice degeneration, right eye 09/08/2019  . Chronic diastolic heart failure ([place]) 05/24/2019  . Urinary dysfunction 04/12/2019  . Osteoarthritis of right knee 08/23/2018  . Constipation due to opioid therapy 03/30/2018  . SNHL (sensorineural hearing loss) 12/04/2017  . Abnormal urinary stream 12/03/2017  . Osteoarthritis of carpometacarpal ([facility])  joint of thumb 11/30/2017  . Muscle weakness 11/17/2017  . Transient vision disturbance 11/12/2017  . Bilateral hand pain 10/30/2017  . Pain of left hip joint 10/09/2017  . Gynecomastia 07/10/2017  . Iron deficiency anemia 07/05/2017  . Spasticity 05/20/2017  . Lumbar radiculitis 04/20/2017  . Ulnar 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, left 11/28/2016  . Idiopathic peripheral neuropathy 11/28/2016  . 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 Long term (current) use of anticoagulants 08/23/2015  . Right upper quadrant abdominal pain 08/23/2015  . Memory loss 05/10/2015  . Gait abnormality 04/07/2015  . Alkaline phosphatase elevation 04/07/2015  . History of thrombosis 03/26/2015  . Medial epicondylitis 02/07/2015  . Cognitive decline 12/21/2014  . Leukopenia 12/05/2014  . Rotator cuff syndrome of right shoulder 10/27/2014  . Status post left knee replacement 08/22/2014  . Left lateral epicondylitis 08/22/2014  . Chronic cerebral ischemia 08/18/2014  . Arthrofibrosis of knee joint 08/17/2014  . Cubital canal compression syndrome, left 08/17/2014  . 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 Chronic pain syndrome 04/10/2014  . Insomnia 04/10/2014  . Chronic non-specific white matter lesions on MRI 04/10/2014  . CFS (chronic fatigue syndrome) 04/10/2014  . 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 Biceps tendonitis on left 03/01/2014  . Polycythemia vera ([place]) 12/27/2013  . H/O TIA (transient ischemic attack) and stroke 12/27/2013  . Neck pain 12/27/2013  . OSA (obstructive sleep apnea) 12/08/2013  . 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 Depression with anxiety 08/04/2013  . Protein C deficiency ([place]) 08/01/2013  . Post traumatic stress disorder (PTSD) 08/01/2013  . Speech abnormality 07/25/2013  . Obesity 05/10/2013  . Lower extremity edema 05/10/2013  . GERD (gastroesophageal reflux disease) 05/10/2013  . Arthritis 05/10/2013  . OA (osteoarthritis) of knee 03/15/2013  .  Headache 10/25/2012  . Palpitations 10/18/2012  . Fatty liver determined by biopsy 06/01/2012  . Arthropathic psoriasis, unspecified ([place]) 04/29/2012  . Abnormal liver enzymes 03/29/2012  . Psoriatic arthritis ([place]) 12/29/2011  . Left Renal Hydronephrosis 12/11/2010  . 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 Celiac disease 05/27/2010  . Thyroid nodule 05/27/2010  . Male-to-male transgender person 09/20/2002    [facility] ,[place], [name]  06/12/2020, 5:37 PM  [facility] [address] [place], [place], [address] Phone: [phone]   Fax:  [phone]   Name: Jesse Bowers MRN: [id number] Date of Birth: September 28, 1965

## 2020-06-13 ENCOUNTER — Ambulatory Visit: Payer: BC Managed Care – PPO | Admitting: Psychology

## 2020-06-13 ENCOUNTER — Other Ambulatory Visit: Payer: Self-pay | Admitting: Physical Medicine & Rehabilitation

## 2020-06-13 DIAGNOSIS — R252 Cramp and spasm: Secondary | ICD-10-CM

## 2020-06-18 ENCOUNTER — Telehealth: Payer: Self-pay | Admitting: *Deleted

## 2020-06-18 ENCOUNTER — Ambulatory Visit: Payer: BC Managed Care – PPO | Admitting: Psychology

## 2020-06-18 DIAGNOSIS — M5416 Radiculopathy, lumbar region: Secondary | ICD-10-CM

## 2020-06-18 DIAGNOSIS — L4050 Arthropathic psoriasis, unspecified: Secondary | ICD-10-CM

## 2020-06-18 NOTE — Telephone Encounter (Addendum)
Jesse Bowers called and says he has had his surgery and Dr [name] said he would cover his pain medications.  I looked at the ortho note in care everywhere and it looks like the MD prescribed oxycodone-acetaminophen 10 mg-325 mg tablet Take 1 tablet every 4 hours by oral route for 5 days, but [name] did not fill it.  He cannot take the acetaminophen because of his liver issues. He is waiting for Dr [name] to prescribe.  Per the PMP last fills were  06/01/2020 Morphine Sulfate Er 60 Mg Cap #60.00  05/30/2020 Oxycodone Hcl 10 Mg Tablet 70.00

## 2020-06-19 ENCOUNTER — Ambulatory Visit: Payer: BC Managed Care – PPO

## 2020-06-19 MED ORDER — OXYCODONE HCL 10 MG PO TABS: 10.0000 mg | ORAL_TABLET | ORAL | 0 refills | Status: DC | PRN

## 2020-06-19 NOTE — Telephone Encounter (Signed)
He may increase to 72m q4 hours prn for 2-3 days as needed. I will write a new rx today to reflect this change/increase. This will be a one-time only adjustment. Then we'll go back to prior rx.

## 2020-06-19 NOTE — Telephone Encounter (Signed)
Notified. 

## 2020-06-19 NOTE — Telephone Encounter (Signed)
Jesse Bowers called back and he says the pain is not being covered by his oxycodone at q 8 hours.  He is asking for permission to increase his oxy to q 4 hours for the next couple of days. (the ortho wrote the percocet which he cannot take for q 4 hours for 5 days) Please advise

## 2020-06-20 ENCOUNTER — Ambulatory Visit: Payer: BC Managed Care – PPO | Admitting: Psychology

## 2020-06-21 ENCOUNTER — Ambulatory Visit (INDEPENDENT_AMBULATORY_CARE_PROVIDER_SITE_OTHER): Payer: BC Managed Care – PPO | Admitting: Psychology

## 2020-06-21 DIAGNOSIS — F4321 Adjustment disorder with depressed mood: Secondary | ICD-10-CM

## 2020-06-21 DIAGNOSIS — F4323 Adjustment disorder with mixed anxiety and depressed mood: Secondary | ICD-10-CM

## 2020-06-22 ENCOUNTER — Ambulatory Visit: Payer: BC Managed Care – PPO

## 2020-06-22 ENCOUNTER — Encounter: Payer: Self-pay | Admitting: Cardiovascular Disease

## 2020-06-22 ENCOUNTER — Other Ambulatory Visit: Payer: Self-pay

## 2020-06-22 ENCOUNTER — Ambulatory Visit: Payer: BC Managed Care – PPO | Admitting: Cardiovascular Disease

## 2020-06-22 VITALS — BP 119/76 | HR 89 | Ht 69.0 in | Wt 221.8 lb

## 2020-06-22 DIAGNOSIS — I5032 Chronic diastolic (congestive) heart failure: Secondary | ICD-10-CM

## 2020-06-22 DIAGNOSIS — D6859 Other primary thrombophilia: Secondary | ICD-10-CM

## 2020-06-22 DIAGNOSIS — G9519 Other vascular myelopathies: Secondary | ICD-10-CM | POA: Diagnosis not present

## 2020-06-22 DIAGNOSIS — R002 Palpitations: Secondary | ICD-10-CM

## 2020-06-22 DIAGNOSIS — G4733 Obstructive sleep apnea (adult) (pediatric): Secondary | ICD-10-CM | POA: Diagnosis not present

## 2020-06-22 DIAGNOSIS — Z7901 Long term (current) use of anticoagulants: Secondary | ICD-10-CM

## 2020-06-22 NOTE — Patient Instructions (Signed)
Medication Instructions:  Your physician recommends that you continue on your current medications as directed. Please refer to the Current Medication list given to you today.  *If you need a refill on your cardiac medications before your next appointment, please call your pharmacy*   Lab Work: None ordered  If you have labs (blood work) drawn today and your tests are completely normal, you will receive your results only by: [name] MyChart Message (if you have MyChart) OR . A paper copy in the mail If you have any lab test that is abnormal or we need to change your treatment, we will call you to review the results.   Testing/Procedures: None ordered   Follow-Up: At [facility], you and your health needs are our priority.  As part of our continuing mission to provide you with exceptional heart care, we have created designated Provider Care Teams.  These Care Teams include your primary Cardiologist (physician) and Advanced Practice Providers (APPs -  Physician Assistants and Nurse Practitioners) who all work together to provide you with the care you need, when you need it.  We recommend signing up for the patient portal called "MyChart".  Sign up information is provided on this After Visit Summary.  MyChart is used to connect with patients for Virtual Visits (Telemedicine).  Patients are able to view lab/test results, encounter notes, upcoming appointments, etc.  Non-urgent messages can be sent to your provider as well.   To learn more about what you can do with MyChart, go to [email or web address].    Your next appointment:   12 month(s)  The format for your next appointment:   In Person  Provider:   Sanda Klein, MD

## 2020-06-22 NOTE — Progress Notes (Signed)
Cardiology Office Note:    Date:  06/23/2020   ID:  Jesse Bowers, DOB 11-21-1965, MRN [id number]  PCP:  Shawnee Knapp, MD  Cardiologist:  Sanda Klein, MD   Referring MD: Shawnee Knapp, MD   Chief Complaint  Patient presents with  . Congestive Heart Failure    History of Present Illness:    Jesse Bowers is a 55 y.o. adult with a hx of chronic diastolic heart failure, obstructive sleep apnea on CPAP, history of previous stroke on chronic anticoagulation with Xarelto (protein C deficiency) returning for routine follow-up. 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  Weight increased after his daughter left for college. He had thumb surgery March 18 (bone graft to reconstruct trapezium due to psoriatic arthritis) and after surgery had edema and about a 10 lb weight gain.  He has steadily lost that extra fluid and is down to 221 lb (usually 219 lb "dry weight"). Had edema, now resolved. Otherwise he has no CV complaints.  The patient specifically denies any chest pain at rest (very limited ability for exertion due to back/joint issues and sequelae of old CVA), dyspnea at rest or with exertion, orthopnea, paroxysmal nocturnal dyspnea, syncope, palpitations, focal neurological deficits, intermittent claudication, lower extremity edema, unexplained weight gain, cough, hemoptysis or wheezing.  Had a f/u echo 04/27/2020. He has preserved left ventricular systolic function (YO>37%, GLS -24%), but with some echo evidence of diastolic dysfunction (mildly decreased e' for age, E/A 0.7).   BP is normal. No orthostatic hypotension events (used to occur while on prazosin). Denies falls, beeding.  He has complex obstructive and central sleep apnea, with mild daytime hypersomnolence, using his BiPAP. On MTX and Cosentyx for psoriatic arthritis.  His daughter left for college at [employer] in [place], where she swims  competitively.  Past Medical History:  Diagnosis Date  . Abnormal weight loss   . Anxiety   . Arthritis   . Cataract    OU  . Celiac disease   . Cervical neck pain with evidence of disc disease    patient has a cyst   . Chronic constipation   . Chronic diastolic heart failure ([place])    Pt. denies  . Chronic pain   . Degenerative disc disease at L5-S1 level    with stenosis  . DVT (deep venous thrombosis) (HCC)    Right upper arm, bilateral leg  . Eczema    inguinal, feet  . Elevated liver enzymes   . 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 Family history of adverse reaction to anesthesia    family has problems with anesthesia of nausea and vomiting   . 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 GERD (gastroesophageal reflux disease)    History of in 20's  . Gluten enteropathy   . H/O parotitis    right   . Hard of hearing   . History of kidney stones   . History of retinal tear    Bilateral  . History of staph infection    required wound vac  . Hx-TIA (transient ischemic attack)    2015  . LVH (left ventricular hypertrophy) 12/15/2016   Mild, noted on ECHO  . MVP (mitral valve prolapse)   . NAFL (nonalcoholic fatty liver)   . Neck pain   . Neuromuscular disorder ([place])    bilateral neuropathy feet.  . Nuclear sclerotic cataract of both eyes 09/08/2019  . Pneumonia  12/17/2010  . Polycythemia   . Polycythemia, secondary   . PONV (postoperative nausea and vomiting)   . Protein C deficiency ([place])    Dr. Anne Fu  . Psoriasis    16 X10 cm psoriatic rash on sole of left foot ; open and occ scant bleeding;   . psoriatic arthritis   . PTSD (post-traumatic stress disorder)   . Scaphoid fracture of wrist 09/23/2013  . Seizure ([place])    childhood, medication until age 88 then weanned completely off  . Sleep apnea    split night study last done by Dr. [name] 06/18/15 shows severe OSA, CSA, and hypersomnia, rec bipap  . Splenomegaly   . Stenosis of ureteropelvic  junction (UPJ)    left  . Stroke [facility])    CVA vs TIA in left cerebrum causing slight right sided weakness-Dr. [name] follows  . Syrinx of spinal cord ([place]) 01/06/2014   c spine on MRI  . Tachycardia    hx of   . Transfusion history    past history- none recent, after surgeries due to blood loss  . Wears glasses   . Wears hearing aid     Past Surgical History:  Procedure Laterality Date  . ABDOMINAL HYSTERECTOMY Bilateral 1994   TAH, BSO- tranverse incision at 55 yo  . ANKLE ARTHROSCOPY WITH RECONSTRUCTION Right 2007  . CHOLECYSTECTOMY     laparoscopic  . COLONOSCOPY     x3  . EYE SURGERY     Left eye 03/02/2018, right 02/15/2018  . HIP ARTHROSCOPY W/ LABRAL REPAIR Right 05/11/2013   acetabular labral tear 03/30/2013  . KNEE ARTHROPLASTY Right   . KNEE JOINT MANIPULATION Left    x3 under anesthesia  . KNEE SURGERY Bilateral 1984   Right ACL, left PCL repair  . LITHOTRIPSY  2005  . LIVER BIOPSY  2013   normal results.  [name] MASTECTOMY Bilateral    prior to 2009  . MOUTH SURGERY    . NASAL SEPTUM SURGERY N/A 09/20/2015   by ENT Dr. [name]  . OVARIAN CYST SURGERY Left    size of grapefruit, was informed that she had shortened vagina  . SHOULDER SURGERY Bilateral    Right 08/15/2016, Left 11/15/2016  . THUMB ARTHROSCOPY Left   . THYROIDECTOMY, PARTIAL Left 2008  . TOTAL KNEE ARTHROPLASTY Right 08/23/2018   Procedure: TOTAL KNEE ARTHROPLASTY;  Surgeon: [name], MD;  Location: [facility];  Service: Orthopedics;  Laterality: Right;  51mn  . TOTAL KNEE REVISION Left 02/06/2016   Procedure: LEFT TOTAL KNEE REVISION;  Surgeon: FGaynelle Arabian MD;  Location: [facility];  Service: Orthopedics;  Laterality: Left;  . TOTAL KNEE REVISION Left 04/22/2017   Procedure: Left knee polyethylene revision;  Surgeon: AGaynelle Arabian MD;  Location: [facility];  Service: Orthopedics;  Laterality: Left;  . UPPER GI ENDOSCOPY  2003    Current Medications: Current Meds  Medication Sig  . acyclovir  ointment (ZOVIRAX) 5 % as needed.  . baclofen (LIORESAL) 20 MG tablet TAKE 1 TABLET BY MOUTH 4 TIMES DAILY.  . Calcipotriene-Betameth Diprop 0.005-0.064 % FOAM Apply 1 application topically 2 (two) times daily as needed.   . clorazepate (TRANXENE) 15 MG tablet Take 15 mg by mouth at bedtime.  . clorazepate (TRANXENE) 7.5 MG tablet Take 7.5 mg by mouth 2 (two) times daily.  . COSENTYX SENSOREADY, 300 MG, 150 MG/ML SOAJ Inject 2 Syringes into the skin every 28 (twenty-eight) days.  . Cyanocobalamin (VITAMIN B-12) 5000 MCG SUBL Place  5,000 mcg under the tongue daily.  [name] desonide (DESOWEN) 0.05 % ointment Apply 1 application topically 2 (two) times daily as needed (psoriasis).   [name] ergocalciferol (VITAMIN D2) 1.25 MG (50000 UT) capsule Take 1 capsule by mouth once a week.  . finasteride (PROPECIA) 1 MG tablet Take 1 mg by mouth daily.  . Fluocinolone Acetonide 0.01 % OIL Place 3 drops into both ears 2 (two) times daily as needed for itching.  . folic acid (FOLVITE) 1 MG tablet Take 1 mg by mouth daily.  . Furosemide (LASIX PO) Take 60 mg by mouth daily.  . methotrexate (RHEUMATREX) 2.5 MG tablet Take 10 mg by mouth once a week. Caution:Chemotherapy. Protect from light.  . morphine (KADIAN) 60 MG 24 hr capsule Take 1 capsule (60 mg total) by mouth every 12 (twelve) hours.  [name] neomycin-polymyxin-hydrocortisone (CORTISPORIN) 3.5-10000-1 OTIC suspension Place 4 drops into both ears 2 (two) times daily as needed (ear pain).   . Oxycodone HCl 10 MG TABS Take 1 tablet (10 mg total) by mouth every 4 (four) hours as needed.  . potassium chloride (KLOR-CON) 10 MEQ tablet TAKE 2 TABLETS BY MOUTH EVERY DAY  . rivaroxaban (XARELTO) 20 MG TABS tablet TAKE 1 TABLET(20 MG) BY MOUTH EVERY NIGHT AT BEDTIME  . testosterone cypionate (DEPOTESTOSTERONE CYPIONATE) 200 MG/ML injection INJECT 0.5 MLS (100 MG TOTAL) INTO THE MUSCLE ONCE A WEEK.  . ursodiol (ACTIGALL) 250 MG tablet Take 250 mg by mouth 2 (two) times daily with  a meal.   . ursodiol (ACTIGALL) 500 MG tablet Take 1 tablet midday with lunch and 1 tablet in the evening with dinneer  . venlafaxine XR (EFFEXOR-XR) 150 MG 24 hr capsule Take 2 capsules (300 mg total) by mouth daily with breakfast.     Allergies:   Penicillin g, Sulfa antibiotics, Tegaderm ag mesh [silver], Ibuprofen, Pregabalin, and Sulfacetamide sodium-sulfur   Social History   Socioeconomic History  . Marital status: Married    Spouse name: Not on file  . Number of children: 2  . Years of education: 4y college  . Highest education level: Not on file  Occupational History  . Occupation: Pediatric Nurse practitioner    Comment: Not working since [place] 2015  Tobacco Use  . Smoking status: Never Smoker  . Smokeless tobacco: Never Used  Vaping Use  . Vaping Use: Never used  Substance and Sexual Activity  . Alcohol use: Yes    Comment: social  . Drug use: No  . Sexual activity: Yes    Birth control/protection: None    Comment: patient is a transgender on testosterone shots, no biological kids  Other Topics Concern  . Not on file  Social History Narrative   Education 4 year college, former Therapist, sports X 15 years, pediatric nurse practitioner x 6 years, did NP degree from [employer] [place]. Relocated to [place] about 2 months ago from [place], [place]. Patient was in [place] for last 4 years and prior to that in [place]. His wife is working as Scientist, research (physical sciences) for [employer]. Patient is not working and applying for disability. They have 2 kids but no biologic children.    Social Determinants of Health   Financial Resource Strain: Not on file  Food Insecurity: Not on file  Transportation Needs: Not on file  Physical Activity: Not on file  Stress: Not on file  Social Connections: Not on file     Family History: The patient's family history includes Breast cancer in his sister; Breast cancer (  age of onset: 76) in his maternal aunt; Cancer in his paternal grandfather; Congestive Heart  Failure in his maternal grandmother; Glaucoma in his maternal grandfather and mother; Heart attack in his maternal grandfather, maternal grandmother, and paternal grandfather; Hypertension in his mother; Macular degeneration in his maternal grandfather; Other in his mother; Polycythemia in his paternal uncle; Protein C deficiency (age of onset: 3) in his sister; Psoriasis in his mother; Stroke in his maternal grandfather, maternal grandmother, and paternal uncle.  ROS:   Please see the history of present illness.    All other systems are reviewed and are negative  EKGs/Labs/Other Studies Reviewed:    EKG:  EKG is ordered today and shows NSR, somewhat delayed precordial R wave progression, otw normal. QTc 389 ms.  Recent Labs: 05/24/2020: ALT 29; BUN 17; Creatinine, Ser 1.16; Hemoglobin 14.2; Platelets 161; Potassium 4.1; Sodium 139  Recent Lipid Panel    Component Value Date/Time   CHOL 142 01/05/2018 1509   TRIG 45 01/05/2018 1509   HDL 52 01/05/2018 1509   CHOLHDL 2.7 01/05/2018 1509   LDLCALC 81 01/05/2018 1509   06/13/2020  Cholesterol 128, HDL 42, TG 41  Hgb 13.1, low MCV, WBC 3.6, plt 167 Ferritin 9, Transferin saturation 7%  Physical Exam:    VS:  BP 119/76   Pulse 89   Ht 5' 9"  (1.753 m)   Wt 221 lb 12.8 oz (100.6 kg)   SpO2 97%   BMI 32.75 kg/m     Wt Readings from Last 3 Encounters:  06/22/20 221 lb 12.8 oz (100.6 kg)  05/30/20 223 lb (101.2 kg)  05/24/20 224 lb (101.6 kg)      General: Alert, oriented x3, no distress, in electric whelchair Head: no evidence of trauma, PERRL, EOMI, no exophtalmos or lid lag, no myxedema, no xanthelasma; normal ears, nose and oropharynx Neck: normal jugular venous pulsations and no hepatojugular reflux; brisk carotid pulses without delay and no carotid bruits Chest: clear to auscultation, no signs of consolidation by percussion or palpation, normal fremitus, symmetrical and full respiratory excursions Cardiovascular: normal  position and quality of the apical impulse, regular rhythm, normal first and second heart sounds, no murmurs, rubs or gallops Abdomen: no tenderness or distention, no masses by palpation, no abnormal pulsatility or arterial bruits, normal bowel sounds, no hepatosplenomegaly Extremities: no clubbing, cyanosis or edema; 2+ radial, ulnar and brachial pulses bilaterally; 2+ right femoral, posterior tibial and dorsalis pedis pulses; 2+ left femoral, posterior tibial and dorsalis pedis pulses; no subclavian or femoral bruits Neurological: grossly nonfocal Psych: Normal mood and affect   ASSESSMENT:    1. Chronic diastolic heart failure ([place])   2. Neurogenic claudication (HCC)   3. Palpitations   4. OSA (obstructive sleep apnea)   5. Protein C deficiency ([place])   6. Long term current use of anticoagulant    PLAN:    In order of problems listed above:  1. CHF: Had transient hypervolemia, probably due to periop IV fluids, now appears to be back to euvolemia, on the current dose of furosemide appears to be at the appropriate point.  The echo actually shows pretty mild signs of LV diastolic dysfunction. I suspect more of his tendency to develop edema may be related to right heart dysfunction from chronic sleep disordered breathing. 2. Neurogenic claudication: normal ABIs in September 2020.  3. Palpitations: Not a current complaint. 4. OSA: Compliant with BiPAP.  Still has some daytime hypersomnolence, but armodafinil did not help. 5.  Protein C  deficiency: On chronic rivaroxaban. Reported history of stroke. MRI stable 2016-2017-2019: "tiny punctate periventricular and subcortical white matter hyperintensities likely due to chronic microvascular ischemia", "Scattered chronic cerebral white matter signal abnormality is nonspecific but compatible with sequelae of hypercoagulable state" 6. Anticoagulation: no bleeding events   Medication Adjustments/Labs and Tests Ordered: Current medicines are reviewed  at length with the patient today.  Concerns regarding medicines are outlined above.  Orders Placed This Encounter  Procedures  . EKG 12-Lead   No orders of the defined types were placed in this encounter.   Patient Instructions  Medication Instructions:  Your physician recommends that you continue on your current medications as directed. Please refer to the Current Medication list given to you today.  *If you need a refill on your cardiac medications before your next appointment, please call your pharmacy*   Lab Work: None ordered  If you have labs (blood work) drawn today and your tests are completely normal, you will receive your results only by: [name] MyChart Message (if you have MyChart) OR . A paper copy in the mail If you have any lab test that is abnormal or we need to change your treatment, we will call you to review the results.   Testing/Procedures: None ordered   Follow-Up: At [facility], you and your health needs are our priority.  As part of our continuing mission to provide you with exceptional heart care, we have created designated Provider Care Teams.  These Care Teams include your primary Cardiologist (physician) and Advanced Practice Providers (APPs -  Physician Assistants and Nurse Practitioners) who all work together to provide you with the care you need, when you need it.  We recommend signing up for the patient portal called "MyChart".  Sign up information is provided on this After Visit Summary.  MyChart is used to connect with patients for Virtual Visits (Telemedicine).  Patients are able to view lab/test results, encounter notes, upcoming appointments, etc.  Non-urgent messages can be sent to your provider as well.   To learn more about what you can do with MyChart, go to [email or web address].    Your next appointment:   12 month(s)  The format for your next appointment:   In Person  Provider:   Sanda Klein, MD       Signed, Sanda Klein, MD  06/23/2020 9:04 AM    [place]

## 2020-06-23 ENCOUNTER — Encounter: Payer: Self-pay | Admitting: Cardiovascular Disease

## 2020-06-25 ENCOUNTER — Ambulatory Visit: Payer: BC Managed Care – PPO | Admitting: Psychology

## 2020-06-26 DIAGNOSIS — R002 Palpitations: Secondary | ICD-10-CM

## 2020-06-27 ENCOUNTER — Ambulatory Visit: Payer: BC Managed Care – PPO

## 2020-06-27 ENCOUNTER — Other Ambulatory Visit: Payer: Self-pay

## 2020-06-27 DIAGNOSIS — R4701 Aphasia: Secondary | ICD-10-CM

## 2020-06-27 DIAGNOSIS — R41841 Cognitive communication deficit: Secondary | ICD-10-CM

## 2020-06-27 NOTE — Telephone Encounter (Signed)
It's important to know whether the increase and decrease in heart rate is gradual (which generally means it's a physiological response to something outside the cardiovascular system, including fever, pain, anxiety, infection, etc) versus abrupt onset and termination (which could be a true rhythm problem). The fact that hot and sweaty sensation accompanies the fast heart beat makes it likely that all the symptoms are adrenaline driven, most probably due to a noncardiac stimulus. Does the Apple Watch 6 allow recording of the rhythm/ECG? If so, please send a picture of the ECG rhythm. You have to put finger of opposite hand on crown of smart Watch). If not, please get a 72 hour Zio (arrhythmia monitor patch).

## 2020-06-27 NOTE — Patient Instructions (Signed)
Strategies for Improving Your Attention and Memory  Use good eye-contact  Give the speaker your undivided attention  Look directly at the speaker  Complete one task at a time  Avoid multitasking  Complete one task before starting a new one  Write a note to yourself if you think of something else that needs to be done  Let others know when you need quiet time and can't be interrupted  Don't answer the phone, texts, or emails while you are working on another task  Put aside distracting thoughts  If you find your mind wandering, refocus your attention on the speaker  Avoid off-topic comments or responses that may divert your attention   If something important comes to mind, let the speaker know and pause to write yourself a note: "Do you mind holding on a minute, I have to write something down."  Put thoughts on hold and focus on salient information  Limit distractions in your environment  Think about the environment around you  Limit background noise by turning off the TV or music, putting your phone away  Close the door and work in quiet  Use active listening  Actively participate in the conversation to stay focused  Paraphrase what you have heard to include the most important details  Adding some associations may help you remember  Ask questions to clarify certain points  Summarize the speaker's comments periodically  Avoid nodding your head and using "mhm" responses as these are more passive and don't help your attention  Alert the other person/people  It may be helpful to alert your listener to the fact that you may need reminders to keep on track  Tell the speaker in advance that you may need to stop them and have them repeat salient information  If you lose focus, interject and let the person know, "I'm sorry, I lost you, can you tell me again?"  Write down information  Write down pertinent information as it comes up, such as telephone numbers, names  of people, addresses, details from appointments and conversations, etc.

## 2020-06-27 NOTE — Therapy (Addendum)
[facility] [address] [place], [place], [address] Phone: [phone]   Fax:  [phone]  Speech Language Pathology Treatment  Patient Details  Name: Jesse Bowers MRN: [id number] Date of Birth: 1965/09/03 Referring Provider (SLP): Alger Simons, MD   Encounter Date: 06/27/2020   End of Session - 06/27/20 1247    Visit Number 7    Number of Visits 9    Date for SLP Re-Evaluation 07/29/20    SLP Start Time 3    SLP Stop Time  1315    SLP Time Calculation (min) 45 min    Activity Tolerance Patient tolerated treatment well;Patient limited by pain           Past Medical History:  Diagnosis Date  . Abnormal weight loss   . Anxiety   . Arthritis   . Cataract    OU  . Celiac disease   . Cervical neck pain with evidence of disc disease    patient has a cyst   . Chronic constipation   . Chronic diastolic heart failure ([place])    Pt. denies  . Chronic pain   . Degenerative disc disease at L5-S1 level    with stenosis  . DVT (deep venous thrombosis) ([facility])    Right upper arm, bilateral leg  . Eczema    inguinal, feet  . Elevated liver enzymes   . 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 Family history of adverse reaction to anesthesia    family has problems with anesthesia of nausea and vomiting   . 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 GERD (gastroesophageal reflux disease)    History of in 20's  . Gluten enteropathy   . H/O parotitis    right   . Hard of hearing   . History of kidney stones   . History of retinal tear    Bilateral  . History of staph infection    required wound vac  . Hx-TIA (transient ischemic attack)    2015  . LVH (left ventricular hypertrophy) 12/15/2016   Mild, noted on ECHO  . MVP (mitral valve prolapse)   . NAFL (nonalcoholic fatty liver)   . Neck pain   . Neuromuscular disorder ([place])    bilateral neuropathy feet.  . Nuclear sclerotic cataract of both eyes  09/08/2019  . Pneumonia 12/17/2010  . Polycythemia   . Polycythemia, secondary   . PONV (postoperative nausea and vomiting)   . Protein C deficiency ([place])    Dr. Anne Fu  . Psoriasis    16 X10 cm psoriatic rash on sole of left foot ; open and occ scant bleeding;   . psoriatic arthritis   . PTSD (post-traumatic stress disorder)   . Scaphoid fracture of wrist 09/23/2013  . Seizure ([place])    childhood, medication until age 55 then weanned completely off  . Sleep apnea    split night study last done by Dr. [name] 06/18/15 shows severe OSA, CSA, and hypersomnia, rec bipap  . Splenomegaly   . Stenosis of ureteropelvic junction (UPJ)    left  . Stroke [facility])    CVA vs TIA in left cerebrum causing slight right sided weakness-Dr. [name] follows  . Syrinx of spinal cord ([place]) 01/06/2014   c spine on MRI  . Tachycardia    hx of   . Transfusion history    past history- none recent, after surgeries due to blood loss  . Wears glasses   . Wears hearing aid  Past Surgical History:  Procedure Laterality Date  . ABDOMINAL HYSTERECTOMY Bilateral 1994   TAH, BSO- tranverse incision at 55 yo  . ANKLE ARTHROSCOPY WITH RECONSTRUCTION Right 2007  . CHOLECYSTECTOMY     laparoscopic  . COLONOSCOPY     x3  . EYE SURGERY     Left eye 03/02/2018, right 02/15/2018  . HIP ARTHROSCOPY W/ LABRAL REPAIR Right 05/11/2013   acetabular labral tear 03/30/2013  . KNEE ARTHROPLASTY Right   . KNEE JOINT MANIPULATION Left    x3 under anesthesia  . KNEE SURGERY Bilateral 1984   Right ACL, left PCL repair  . LITHOTRIPSY  2005  . LIVER BIOPSY  2013   normal results.  [name] MASTECTOMY Bilateral    prior to 2009  . MOUTH SURGERY    . NASAL SEPTUM SURGERY N/A 09/20/2015   by ENT Dr. [name]  . OVARIAN CYST SURGERY Left    size of grapefruit, was informed that she had shortened vagina  . SHOULDER SURGERY Bilateral    Right 08/15/2016, Left 11/15/2016  . THUMB ARTHROSCOPY Left   . THYROIDECTOMY, PARTIAL Left  2008  . TOTAL KNEE ARTHROPLASTY Right 08/23/2018   Procedure: TOTAL KNEE ARTHROPLASTY;  Surgeon: [name], MD;  Location: [facility];  Service: Orthopedics;  Laterality: Right;  28mn  . TOTAL KNEE REVISION Left 02/06/2016   Procedure: LEFT TOTAL KNEE REVISION;  Surgeon: FGaynelle Arabian MD;  Location: [facility];  Service: Orthopedics;  Laterality: Left;  . TOTAL KNEE REVISION Left 04/22/2017   Procedure: Left knee polyethylene revision;  Surgeon: AGaynelle Arabian MD;  Location: [facility];  Service: Orthopedics;  Laterality: Left;  . UPPER GI ENDOSCOPY  2003    There were no vitals filed for this visit.   Subjective Assessment - 06/27/20 1233    Subjective "I had surgery"    Currently in Pain? Yes    Pain Score 7     Pain Location Hand    Pain Orientation Right    Pain Descriptors / Indicators Sore                 ADULT SLP TREATMENT - 06/27/20 1234      General Information   Behavior/Cognition Alert;Cooperative;Pleasant mood      Treatment Provided   Treatment provided Cognitive-Linquistic      Cognitive-Linquistic Treatment   Treatment focused on Cognition;Aphasia;Patient/family/caregiver education    Skilled Treatment Pt able to verbally recall recent targeted tasks in previous ST sessions, including working on writing concise emails to other SLP as well as monitoring length of conversation. Pt endorsed he was late on completing email tasks as he couldn't remember what "poignant" meant. After days, pt did look up word which prompted completion of task. Successful use of monitoring clock reported to control length of conversation. Pt also reported his marriage counselor did comment on his improved focus during his last session. Usual interjection of extraneous information or deviations in topic noted this session, requiring SLP prompt for concluding sentence or re-direction. SLP reviewed external and internal distractions which appear be impacting pt's awareness and carryover of  educated techniques. SLP reviewed previous recommendation of concise, clear verbal output, in which pt able to demo with min SLP A to summarize in 3-5 sentences. Minimal word finding and halted speech noted in conversation this session.      Assessment / Recommendations / Plan   Plan Continue with current plan of care      Progression Toward Goals   Progression toward goals  Progressing toward goals            SLP Education - 06/27/20 1439    Education Details awareness, non-verbal cues, functional practice    Person(s) Educated Patient    Methods Explanation;Demonstration;Verbal cues    Comprehension Verbalized understanding;Returned demonstration;Verbal cues required;Need further instruction              SLP Long Term Goals - 06/27/20 1304      SLP LONG TERM GOAL #1   Title Verbalize daily schedule, recent events and salient information using external memory aids    Baseline 05-25-20    Period --   or 9 total visits, for all LTGs   Status Achieved      SLP LONG TERM GOAL #2   Title pt will demonstrate understanding of 10 minutes of conversational discourse or other auditory stimuli using compensatory techniques in 3 sessions    Baseline 06-06-20, 06-12-20    Time 4    Period Weeks    Status On-going      SLP LONG TERM GOAL #3   Title pt will demonstrate understanding/report understanding of written cues to perform household duties and tasks with 100% accuracy x2 sessions    Baseline 06-01-20, 06-12-20    Time 4    Period Weeks    Status On-going      SLP LONG TERM GOAL #4   Title pt will demonstrate appropriate topic maintenance in 10 minutes conversation, with compensatory measures x3 sessions    Baseline 06-12-20    Time 4    Period Weeks    Status On-going      SLP LONG TERM GOAL #5   Title pt's score on QOL measure for communication will increase to >18    Time 4    Period Weeks    Status On-going            Plan - 06/27/20 1440    Clinical Impression  Statement Sebasthian presents today with evidence of receptive language and minimal expressive langauge deficits from a lt CVA in 2015. Pt demonstrated minimal halting speech this session indicative of anomia. Pt benefitted from usual SLP verbal and non-verbal cues to maintain topic effectively this session. SLP recommended that patient reduce lengthy discourse and inclusion of uncessary information. Pt will benefit from skiled ST targeting language and cognition necessary for effective communication.    Speech Therapy Frequency 1x /week    Duration 8 weeks   or 9 total visits   Treatment/Interventions Compensatory techniques;Functional tasks;Environmental controls;Multimodal communcation approach;Language facilitation;SLP instruction and feedback;Patient/family education;Internal/external aids;Cognitive reorganization    Potential to Achieve Goals Fair    Potential Considerations Co-morbidities    SLP Home Exercise Plan provided    Consulted and Agree with Plan of Care Patient           Patient will benefit from skilled therapeutic intervention in order to improve the following deficits and impairments:   Cognitive communication deficit  Aphasia    Problem List Patient Active Problem List   Diagnosis Date Noted  . Pseudophakia of both eyes 05/07/2020  . Retinal telangiectasis of both eyes 05/07/2020  . Lattice degeneration of peripheral retina, left 09/08/2019  . Lattice degeneration, right eye 09/08/2019  . Chronic diastolic heart failure ([place]) 05/24/2019  . Urinary dysfunction 04/12/2019  . Osteoarthritis of right knee 08/23/2018  . Constipation due to opioid therapy 03/30/2018  . SNHL (sensorineural hearing loss) 12/04/2017  . Abnormal urinary stream 12/03/2017  . Osteoarthritis of carpometacarpal ([facility])  joint of thumb 11/30/2017  . Muscle weakness 11/17/2017  . Transient vision disturbance 11/12/2017  . Bilateral hand pain 10/30/2017  . Pain of left hip joint 10/09/2017  .  Gynecomastia 07/10/2017  . Iron deficiency anemia 07/05/2017  . Spasticity 05/20/2017  . Lumbar radiculitis 04/20/2017  . Ulnar 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, left 11/28/2016  . Idiopathic peripheral neuropathy 11/28/2016  . 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 Long term (current) use of anticoagulants 08/23/2015  . Right upper quadrant abdominal pain 08/23/2015  . Memory loss 05/10/2015  . Gait abnormality 04/07/2015  . Alkaline phosphatase elevation 04/07/2015  . History of thrombosis 03/26/2015  . Medial epicondylitis 02/07/2015  . Cognitive decline 12/21/2014  . Leukopenia 12/05/2014  . Rotator cuff syndrome of right shoulder 10/27/2014  . Status post left knee replacement 08/22/2014  . Left lateral epicondylitis 08/22/2014  . Chronic cerebral ischemia 08/18/2014  . Arthrofibrosis of knee joint 08/17/2014  . Cubital canal compression syndrome, left 08/17/2014  . 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 Chronic pain syndrome 04/10/2014  . Insomnia 04/10/2014  . Chronic non-specific white matter lesions on MRI 04/10/2014  . CFS (chronic fatigue syndrome) 04/10/2014  . 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 Biceps tendonitis on left 03/01/2014  . Polycythemia vera ([place]) 12/27/2013  . H/O TIA (transient ischemic attack) and stroke 12/27/2013  . Neck pain 12/27/2013  . OSA (obstructive sleep apnea) 12/08/2013  . 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 Depression with anxiety 08/04/2013  . Protein C deficiency ([place]) 08/01/2013  . Post traumatic stress disorder (PTSD) 08/01/2013  . Speech abnormality 07/25/2013  . Obesity 05/10/2013  . Lower extremity edema 05/10/2013  . GERD (gastroesophageal reflux disease) 05/10/2013  . Arthritis 05/10/2013  . OA (osteoarthritis) of knee 03/15/2013  . Headache 10/25/2012  . Palpitations 10/18/2012  . Fatty liver determined by biopsy 06/01/2012  . Arthropathic psoriasis, unspecified ([place]) 04/29/2012  . Abnormal liver enzymes  03/29/2012  . Psoriatic arthritis ([place]) 12/29/2011  . Left Renal Hydronephrosis 12/11/2010  . 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 Celiac disease 05/27/2010  . Thyroid nodule 05/27/2010  . Male-to-male transgender person 09/20/2002    Alinda Deem, MA CCC-SLP 06/27/2020, 4:05 PM  [facility] [address] [place], [place], [address] Phone: [phone]   Fax:  [phone]   Name: Bransen Fassnacht MRN: [id number] Date of Birth: 12-28-1965

## 2020-06-29 ENCOUNTER — Ambulatory Visit (INDEPENDENT_AMBULATORY_CARE_PROVIDER_SITE_OTHER): Payer: BC Managed Care – PPO

## 2020-06-29 DIAGNOSIS — R002 Palpitations: Secondary | ICD-10-CM

## 2020-07-02 ENCOUNTER — Ambulatory Visit: Payer: BC Managed Care – PPO | Admitting: Psychology

## 2020-07-02 DIAGNOSIS — M5416 Radiculopathy, lumbar region: Secondary | ICD-10-CM

## 2020-07-02 DIAGNOSIS — L4050 Arthropathic psoriasis, unspecified: Secondary | ICD-10-CM

## 2020-07-02 NOTE — Telephone Encounter (Signed)
Please tell Jesse Bowers that the rx's for his morphine and oxy refills will not go through.  Not sure what the problem is. I'll tray later today.

## 2020-07-03 ENCOUNTER — Ambulatory Visit: Payer: BC Managed Care – PPO | Attending: Physical Medicine & Rehabilitation

## 2020-07-03 ENCOUNTER — Other Ambulatory Visit: Payer: Self-pay

## 2020-07-03 DIAGNOSIS — R41841 Cognitive communication deficit: Secondary | ICD-10-CM | POA: Diagnosis present

## 2020-07-03 DIAGNOSIS — R4701 Aphasia: Secondary | ICD-10-CM | POA: Insufficient documentation

## 2020-07-03 MED ORDER — MORPHINE SULFATE ER 60 MG PO CP24: 60.0000 mg | ORAL_CAPSULE | Freq: Two times a day (BID) | ORAL | 0 refills | Status: DC

## 2020-07-03 MED ORDER — OXYCODONE HCL 10 MG PO TABS: 10.0000 mg | ORAL_TABLET | ORAL | 0 refills | Status: DC | PRN

## 2020-07-03 NOTE — Patient Instructions (Signed)
  Use timers to set time constraints for effective boundaries (ex: 30 mins to 1 hour)   Continue practicing being concise when speaking and typing. The more you practice, the more efficient and effective your communication will become.

## 2020-07-03 NOTE — Therapy (Signed)
[facility] [address] [place], [place], [address] Phone: [phone]   Fax:  [phone]  Speech Language Pathology Treatment  Patient Details  Name: Zayden Maffei MRN: [id number] Date of Birth: [birth year] Referring Provider (SLP): Alger Simons, MD   Encounter Date: 07/03/2020   End of Session - 07/03/20 1232    Visit Number 8    Number of Visits 9    Date for SLP Re-Evaluation 07/29/20    SLP Start Time 1232    SLP Stop Time  1315    SLP Time Calculation (min) 43 min    Activity Tolerance Patient tolerated treatment well           Past Medical History:  Diagnosis Date  . Abnormal weight loss   . Anxiety   . Arthritis   . Cataract    OU  . Celiac disease   . Cervical neck pain with evidence of disc disease    patient has a cyst   . Chronic constipation   . Chronic diastolic heart failure ([place])    Pt. denies  . Chronic pain   . Degenerative disc disease at L5-S1 level    with stenosis  . DVT (deep venous thrombosis) (HCC)    Right upper arm, bilateral leg  . Eczema    inguinal, feet  . Elevated liver enzymes   . 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 Family history of adverse reaction to anesthesia    family has problems with anesthesia of nausea and vomiting   . 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 GERD (gastroesophageal reflux disease)    History of in 20's  . Gluten enteropathy   . H/O parotitis    right   . Hard of hearing   . History of kidney stones   . History of retinal tear    Bilateral  . History of staph infection    required wound vac  . Hx-TIA (transient ischemic attack)    2015  . LVH (left ventricular hypertrophy) 12/15/2016   Mild, noted on ECHO  . MVP (mitral valve prolapse)   . NAFL (nonalcoholic fatty liver)   . Neck pain   . Neuromuscular disorder ([place])    bilateral neuropathy feet.  . Nuclear sclerotic cataract of both eyes 09/08/2019  . Pneumonia  12/17/2010  . Polycythemia   . Polycythemia, secondary   . PONV (postoperative nausea and vomiting)   . Protein C deficiency ([place])    Dr. Anne Fu  . Psoriasis    16 X10 cm psoriatic rash on sole of left foot ; open and occ scant bleeding;   . psoriatic arthritis   . PTSD (post-traumatic stress disorder)   . Scaphoid fracture of wrist 09/23/2013  . Seizure ([place])    childhood, medication until age 58 then weanned completely off  . Sleep apnea    split night study last done by Dr. [name] 06/18/15 shows severe OSA, CSA, and hypersomnia, rec bipap  . Splenomegaly   . Stenosis of ureteropelvic junction (UPJ)    left  . Stroke [facility])    CVA vs TIA in left cerebrum causing slight right sided weakness-Dr. [name] follows  . Syrinx of spinal cord ([place]) 01/06/2014   c spine on MRI  . Tachycardia    hx of   . Transfusion history    past history- none recent, after surgeries due to blood loss  . Wears glasses   . Wears hearing aid  Past Surgical History:  Procedure Laterality Date  . ABDOMINAL HYSTERECTOMY Bilateral 1994   TAH, BSO- tranverse incision at 55 yo  . ANKLE ARTHROSCOPY WITH RECONSTRUCTION Right 2007  . CHOLECYSTECTOMY     laparoscopic  . COLONOSCOPY     x3  . EYE SURGERY     Left eye 03/02/2018, right 02/15/2018  . HIP ARTHROSCOPY W/ LABRAL REPAIR Right 05/11/2013   acetabular labral tear 03/30/2013  . KNEE ARTHROPLASTY Right   . KNEE JOINT MANIPULATION Left    x3 under anesthesia  . KNEE SURGERY Bilateral 1984   Right ACL, left PCL repair  . LITHOTRIPSY  2005  . LIVER BIOPSY  2013   normal results.  [name] MASTECTOMY Bilateral    prior to 2009  . MOUTH SURGERY    . NASAL SEPTUM SURGERY N/A 09/20/2015   by ENT Dr. [name]  . OVARIAN CYST SURGERY Left    size of grapefruit, was informed that she had shortened vagina  . SHOULDER SURGERY Bilateral    Right 08/15/2016, Left 11/15/2016  . THUMB ARTHROSCOPY Left   . THYROIDECTOMY, PARTIAL Left 2008  . TOTAL KNEE  ARTHROPLASTY Right 08/23/2018   Procedure: TOTAL KNEE ARTHROPLASTY;  Surgeon: [name], MD;  Location: [facility];  Service: Orthopedics;  Laterality: Right;  66mn  . TOTAL KNEE REVISION Left 02/06/2016   Procedure: LEFT TOTAL KNEE REVISION;  Surgeon: FGaynelle Arabian MD;  Location: [facility];  Service: Orthopedics;  Laterality: Left;  . TOTAL KNEE REVISION Left 04/22/2017   Procedure: Left knee polyethylene revision;  Surgeon: AGaynelle Arabian MD;  Location: [facility];  Service: Orthopedics;  Laterality: Left;  . UPPER GI ENDOSCOPY  2003    There were no vitals filed for this visit.   Subjective Assessment - 07/03/20 1234    Subjective "that's why I was a couple minutes late" re: arm cast    Currently in Pain? Yes    Pain Score 4     Pain Location Hand    Pain Orientation Right    Pain Descriptors / Indicators Numbness    Pain Onset In the past 7 days                 ADULT SLP TREATMENT - 07/03/20 1233      General Information   Behavior/Cognition Alert;Cooperative;Pleasant mood      Treatment Provided   Treatment provided Cognitive-Linquistic      Cognitive-Linquistic Treatment   Treatment focused on Cognition;Aphasia;Patient/family/caregiver education    Skilled Treatment Initially, pt notably distracted by pain and new arm cast. Pt indicated his wife will be out of town over next 2 weeks, in which pt stated "I don't do well alone." Pt reports he already set reminders/alarms for mealtimes, as pt will forget to eat. Pt verbalized improvements in his ability to concisely and effectively communication without surplus of added details. Pt endorsed MD complimented him on a concise email as MD believed his wife had wrote it for him. SLP provided positive reinforcement for effective communication. SLP targeted self-monitoring in structured conversation, with pt able to provide concise answers without unnecessary shifts in topic for 10 minutes. Pt reports he was cognizant of his verbal  output and able to decipher what to include/exclude. SLP recommended pt use timers to improve efficiency during editing emails for increased awareness.      Assessment / Recommendations / Plan   Plan Continue with current plan of care      Progression Toward Goals   Progression  toward goals Progressing toward goals            SLP Education - 07/03/20 1434    Education Details timers, awareness, functional practice    Person(s) Educated Patient    Methods Explanation;Demonstration;Handout    Comprehension Verbalized understanding;Returned demonstration              SLP Long Term Goals - 07/03/20 1232      SLP LONG TERM GOAL #1   Title Verbalize daily schedule, recent events and salient information using external memory aids    Baseline 05-25-20    Period --   or 9 total visits, for all LTGs   Status Achieved      SLP LONG TERM GOAL #2   Title pt will demonstrate understanding of 10 minutes of conversational discourse or other auditory stimuli using compensatory techniques in 3 sessions    Baseline 06-06-20, 06-12-20, 07-03-20    Time --    Period --    Status Achieved      SLP LONG TERM GOAL #3   Title pt will demonstrate understanding/report understanding of written cues to perform household duties and tasks with 100% accuracy x2 sessions    Baseline 06-01-20, 06-12-20    Time --    Period --    Status Achieved      SLP LONG TERM GOAL #4   Title pt will demonstrate appropriate topic maintenance in 10 minutes conversation, with compensatory measures x3 sessions    Baseline 06-12-20, 07-03-20    Time 3    Period Weeks    Status On-going      SLP LONG TERM GOAL #5   Title pt's score on QOL measure for communication will increase to >18    Time 3    Period Weeks    Status On-going            Plan - 07/03/20 1435    Clinical Impression Statement Jeanpaul presents today with improving receptive language and minimal expressive langauge deficits from a lt CVA in 2015. Pt  demonstrated minimal halting speech this session indicative of anomia. Pt able to effecitvley maintain topics this session without cues. Pt reported two positive episodes of effective and concise communication with medical professionals. SLP recommended further practice to reduce lengthy discourse and inclusion of uncessary information. Pt will benefit from skiled ST targeting language and cognition necessary for effective communication.    Speech Therapy Frequency 1x /week    Duration 8 weeks   or 9 total visits   Treatment/Interventions Compensatory techniques;Functional tasks;Environmental controls;Multimodal communcation approach;Language facilitation;SLP instruction and feedback;Patient/family education;Internal/external aids;Cognitive reorganization    Potential to Achieve Goals Fair    Potential Considerations Co-morbidities    SLP Home Exercise Plan provided    Consulted and Agree with Plan of Care Patient           Patient will benefit from skilled therapeutic intervention in order to improve the following deficits and impairments:   Cognitive communication deficit  Aphasia    Problem List Patient Active Problem List   Diagnosis Date Noted  . Pseudophakia of both eyes 05/07/2020  . Retinal telangiectasis of both eyes 05/07/2020  . Lattice degeneration of peripheral retina, left 09/08/2019  . Lattice degeneration, right eye 09/08/2019  . Chronic diastolic heart failure ([place]) 05/24/2019  . Urinary dysfunction 04/12/2019  . Osteoarthritis of right knee 08/23/2018  . Constipation due to opioid therapy 03/30/2018  . SNHL (sensorineural hearing loss) 12/04/2017  . Abnormal urinary stream 12/03/2017  .  Osteoarthritis of carpometacarpal (CMC) joint of thumb 11/30/2017  . Muscle weakness 11/17/2017  . Transient vision disturbance 11/12/2017  . Bilateral hand pain 10/30/2017  . Pain of left hip joint 10/09/2017  . Gynecomastia 07/10/2017  . Iron deficiency anemia 07/05/2017  .  Spasticity 05/20/2017  . Lumbar radiculitis 04/20/2017  . Ulnar 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, left 11/28/2016  . Idiopathic peripheral neuropathy 11/28/2016  . 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 Long term (current) use of anticoagulants 08/23/2015  . Right upper quadrant abdominal pain 08/23/2015  . Memory loss 05/10/2015  . Gait abnormality 04/07/2015  . Alkaline phosphatase elevation 04/07/2015  . History of thrombosis 03/26/2015  . Medial epicondylitis 02/07/2015  . Cognitive decline 12/21/2014  . Leukopenia 12/05/2014  . Rotator cuff syndrome of right shoulder 10/27/2014  . Status post left knee replacement 08/22/2014  . Left lateral epicondylitis 08/22/2014  . Chronic cerebral ischemia 08/18/2014  . Arthrofibrosis of knee joint 08/17/2014  . Cubital canal compression syndrome, left 08/17/2014  . 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 Chronic pain syndrome 04/10/2014  . Insomnia 04/10/2014  . Chronic non-specific white matter lesions on MRI 04/10/2014  . CFS (chronic fatigue syndrome) 04/10/2014  . 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 Biceps tendonitis on left 03/01/2014  . Polycythemia vera ([place]) 12/27/2013  . H/O TIA (transient ischemic attack) and stroke 12/27/2013  . Neck pain 12/27/2013  . OSA (obstructive sleep apnea) 12/08/2013  . 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 Depression with anxiety 08/04/2013  . Protein C deficiency ([place]) 08/01/2013  . Post traumatic stress disorder (PTSD) 08/01/2013  . Speech abnormality 07/25/2013  . Obesity 05/10/2013  . Lower extremity edema 05/10/2013  . GERD (gastroesophageal reflux disease) 05/10/2013  . Arthritis 05/10/2013  . OA (osteoarthritis) of knee 03/15/2013  . Headache 10/25/2012  . Palpitations 10/18/2012  . Fatty liver determined by biopsy 06/01/2012  . Arthropathic psoriasis, unspecified ([place]) 04/29/2012  . Abnormal liver enzymes 03/29/2012  . Psoriatic arthritis ([place]) 12/29/2011  . Left Renal  Hydronephrosis 12/11/2010  . 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 Celiac disease 05/27/2010  . Thyroid nodule 05/27/2010  . Male-to-male transgender person 09/20/2002    Alinda Deem, MA CCC-SLP 07/03/2020, 2:37 PM  [name] [address] [place], [place], [address] Phone: [phone]   Fax:  [phone]   Name: Anders Hohmann MRN: [id number] Date of Birth: 05-16-1965

## 2020-07-04 ENCOUNTER — Telehealth: Payer: Self-pay | Admitting: *Deleted

## 2020-07-04 DIAGNOSIS — M5416 Radiculopathy, lumbar region: Secondary | ICD-10-CM

## 2020-07-04 DIAGNOSIS — L4050 Arthropathic psoriasis, unspecified: Secondary | ICD-10-CM

## 2020-07-04 MED ORDER — OXYCODONE HCL 10 MG PO TABS: 10.0000 mg | ORAL_TABLET | ORAL | 0 refills | Status: DC | PRN

## 2020-07-04 NOTE — Telephone Encounter (Signed)
Re-sent  thx

## 2020-07-04 NOTE — Telephone Encounter (Signed)
Morphine went to [facility] [place].  [facility] needs [facility] [address]. Please reorder.

## 2020-07-05 ENCOUNTER — Ambulatory Visit (INDEPENDENT_AMBULATORY_CARE_PROVIDER_SITE_OTHER): Payer: BC Managed Care – PPO | Admitting: Psychology

## 2020-07-05 ENCOUNTER — Other Ambulatory Visit: Payer: Self-pay | Admitting: *Deleted

## 2020-07-05 DIAGNOSIS — L4050 Arthropathic psoriasis, unspecified: Secondary | ICD-10-CM

## 2020-07-05 DIAGNOSIS — F4312 Post-traumatic stress disorder, chronic: Secondary | ICD-10-CM

## 2020-07-05 DIAGNOSIS — F4323 Adjustment disorder with mixed anxiety and depressed mood: Secondary | ICD-10-CM

## 2020-07-05 DIAGNOSIS — M5416 Radiculopathy, lumbar region: Secondary | ICD-10-CM

## 2020-07-05 MED ORDER — MORPHINE SULFATE ER 60 MG PO CP24: 60.0000 mg | ORAL_CAPSULE | Freq: Two times a day (BID) | ORAL | 0 refills | Status: DC

## 2020-07-05 NOTE — Addendum Note (Signed)
Addended by: Bayard Hugger on: 07/05/2020 10:43 AM   Modules accepted: Orders

## 2020-07-05 NOTE — Telephone Encounter (Signed)
Looks like [facility] sent it this morning. Thanks Zella Ball!

## 2020-07-05 NOTE — Telephone Encounter (Signed)
error 

## 2020-07-05 NOTE — Telephone Encounter (Signed)
PMP was Reviewed. Morphine e-scribed today.  Sybil RN will let him know.

## 2020-07-05 NOTE — Telephone Encounter (Signed)
[facility]. He is asking that it be sent to [facility] [address] [address]

## 2020-07-09 ENCOUNTER — Ambulatory Visit: Payer: BC Managed Care – PPO | Admitting: Psychology

## 2020-07-09 DIAGNOSIS — R002 Palpitations: Secondary | ICD-10-CM

## 2020-07-10 ENCOUNTER — Other Ambulatory Visit: Payer: Self-pay

## 2020-07-10 ENCOUNTER — Ambulatory Visit: Payer: BC Managed Care – PPO

## 2020-07-10 DIAGNOSIS — R41841 Cognitive communication deficit: Secondary | ICD-10-CM

## 2020-07-10 DIAGNOSIS — R4701 Aphasia: Secondary | ICD-10-CM

## 2020-07-10 NOTE — Patient Instructions (Signed)
When you have trouble saying the word you want to say:  1)  Describe it! Describe the size, color, shape, function, composition (what it's made of), and/or location to be able to have the word come sooner, or to have your listener help you out  2) "talk around the word" (say it a totally different way) -get your point out using different words than the one/ones you can't think of  3) Use a synonym - think of another word that means the exact same thing  4) DRAW! You can draw some things you want to say in order to give your listener a hint about what you're talking about  5)  Gesture- make motions to help your listener understand what you are trying to communicate  6) Write down the word, if you can - or the first letter or letters, to help you say the word or to give your listener a hint about what you're trying to say

## 2020-07-10 NOTE — Therapy (Signed)
[facility] [address] [place], [place], [address] Phone: [phone]   Fax:  [phone]  Speech Language Pathology Treatment-Discharge Summary  Patient Details  Name: Jesse Bowers MRN: [id number] Date of Birth: Oct 28, 1965 Referring Provider (SLP): Alger Simons, MD   Encounter Date: 07/10/2020   SPEECH THERAPY DISCHARGE SUMMARY  Visits from Start of Care: 9  Current functional level related to goals / functional outcomes: [name] presents with improvements in expressive and receptive language skills, including improved ability to monitor and demonstrate concise verbal and written communication. Less frequent halting and word finding noted in conversation, although some fluctuations exhibited related to fatigue and/or distractions. Pt has benefited from education and instruction of techniques to limit verbosity and increase clarity of verbal message. Improved auditory receptive language skills noted in longer discourse/conversation. Recent positive comments endorsed from medical professionals highlighting improvements in attention and communication. Pt verbalized agreement with ST discharge and pt reports he is pleased with current progress. Pt may request additional ST services if decline or change in cognitive communication indicated.    Remaining deficits: Attention deficits, mild dysfluency, occasional word finding   Education / Equipment: Compensatory techniques for word finding and verbosity, non-verbal communication, awareness activities    Plan: Patient agrees to discharge.  Patient goals were met. Patient is being discharged due to being pleased with the current functional level.  ?????         End of Session - 07/10/20 1613    Visit Number 9    Number of Visits 9    Date for SLP Re-Evaluation 07/29/20    SLP Start Time 8315    SLP Stop Time  1700    SLP Time Calculation (min) 45 min    Activity  Tolerance Patient tolerated treatment well           Past Medical History:  Diagnosis Date  . Abnormal weight loss   . Anxiety   . Arthritis   . Cataract    OU  . Celiac disease   . Cervical neck pain with evidence of disc disease    patient has a cyst   . Chronic constipation   . Chronic diastolic heart failure ([place])    Pt. denies  . Chronic pain   . Degenerative disc disease at L5-S1 level    with stenosis  . DVT (deep venous thrombosis) ([facility])    Right upper arm, bilateral leg  . Eczema    inguinal, feet  . Elevated liver enzymes   . 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 Family history of adverse reaction to anesthesia    family has problems with anesthesia of nausea and vomiting   . 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 GERD (gastroesophageal reflux disease)    History of in 20's  . Gluten enteropathy   . H/O parotitis    right   . Hard of hearing   . History of kidney stones   . History of retinal tear    Bilateral  . History of staph infection    required wound vac  . Hx-TIA (transient ischemic attack)    2015  . LVH (left ventricular hypertrophy) 12/15/2016   Mild, noted on ECHO  . MVP (mitral valve prolapse)   . NAFL (nonalcoholic fatty liver)   . Neck pain   . Neuromuscular disorder ([place])    bilateral neuropathy feet.  . Nuclear sclerotic cataract of both eyes 09/08/2019  . Pneumonia 12/17/2010  . Polycythemia   .  Polycythemia, secondary   . PONV (postoperative nausea and vomiting)   . Protein C deficiency ([place])    Dr. Anne Fu  . Psoriasis    16 X10 cm psoriatic rash on sole of left foot ; open and occ scant bleeding;   . psoriatic arthritis   . PTSD (post-traumatic stress disorder)   . Scaphoid fracture of wrist 09/23/2013  . Seizure ([place])    childhood, medication until age 65 then weanned completely off  . Sleep apnea    split night study last done by Dr. [name] 06/18/15 shows severe OSA, CSA, and hypersomnia, rec bipap  .  Splenomegaly   . Stenosis of ureteropelvic junction (UPJ)    left  . Stroke [facility])    CVA vs TIA in left cerebrum causing slight right sided weakness-Dr. [name] follows  . Syrinx of spinal cord ([place]) 01/06/2014   c spine on MRI  . Tachycardia    hx of   . Transfusion history    past history- none recent, after surgeries due to blood loss  . Wears glasses   . Wears hearing aid     Past Surgical History:  Procedure Laterality Date  . ABDOMINAL HYSTERECTOMY Bilateral 1994   TAH, BSO- tranverse incision at 55 yo  . ANKLE ARTHROSCOPY WITH RECONSTRUCTION Right 2007  . CHOLECYSTECTOMY     laparoscopic  . COLONOSCOPY     x3  . EYE SURGERY     Left eye 03/02/2018, right 02/15/2018  . HIP ARTHROSCOPY W/ LABRAL REPAIR Right 05/11/2013   acetabular labral tear 03/30/2013  . KNEE ARTHROPLASTY Right   . KNEE JOINT MANIPULATION Left    x3 under anesthesia  . KNEE SURGERY Bilateral 1984   Right ACL, left PCL repair  . LITHOTRIPSY  2005  . LIVER BIOPSY  2013   normal results.  [name] MASTECTOMY Bilateral    prior to 2009  . MOUTH SURGERY    . NASAL SEPTUM SURGERY N/A 09/20/2015   by ENT Dr. [name]  . OVARIAN CYST SURGERY Left    size of grapefruit, was informed that she had shortened vagina  . SHOULDER SURGERY Bilateral    Right 08/15/2016, Left 11/15/2016  . THUMB ARTHROSCOPY Left   . THYROIDECTOMY, PARTIAL Left 2008  . TOTAL KNEE ARTHROPLASTY Right 08/23/2018   Procedure: TOTAL KNEE ARTHROPLASTY;  Surgeon: [name], MD;  Location: [facility];  Service: Orthopedics;  Laterality: Right;  61mn  . TOTAL KNEE REVISION Left 02/06/2016   Procedure: LEFT TOTAL KNEE REVISION;  Surgeon: FGaynelle Arabian MD;  Location: [facility];  Service: Orthopedics;  Laterality: Left;  . TOTAL KNEE REVISION Left 04/22/2017   Procedure: Left knee polyethylene revision;  Surgeon: AGaynelle Arabian MD;  Location: [facility];  Service: Orthopedics;  Laterality: Left;  . UPPER GI ENDOSCOPY  2003    There were no vitals filed  for this visit.   Subjective Assessment - 07/10/20 1615    Subjective "It's been a rough day"    Currently in Pain? Yes    Pain Score 4     Pain Location Hand    Pain Orientation Right                 ADULT SLP TREATMENT - 07/10/20 1614      General Information   Behavior/Cognition Alert;Cooperative;Pleasant mood      Treatment Provided   Treatment provided Cognitive-Linquistic      Cognitive-Linquistic Treatment   Treatment focused on Cognition;Aphasia;Patient/family/caregiver education    Skilled  Treatment Occasional dysfluency/pausing related to mild word finding exhibited in conversation this session, which appears to be reported "brain fatigue." SLP reviewed compensations for word finding and functional practice s/p ST discharge today. Pt stated Pt stated "I have the tools I needed. Now I just have to...practice."     Assessment / Recommendations / Plan   Plan Discharge SLP treatment due to (comment)   last ST session     Progression Toward Goals   Progression toward goals Goals met, education completed, patient discharged from [place] Education - 07/10/20 1615    Education Details compensations, awareness, functional practice    Person(s) Educated Patient    Methods Explanation;Demonstration    Comprehension Verbalized understanding;Returned demonstration              SLP Long Term Goals - 07/10/20 1614      SLP LONG TERM GOAL #1   Title Verbalize daily schedule, recent events and salient information using external memory aids    Baseline 05-25-20    Period --   or 9 total visits, for all LTGs   Status Achieved      SLP LONG TERM GOAL #2   Title pt will demonstrate understanding of 10 minutes of conversational discourse or other auditory stimuli using compensatory techniques in 3 sessions    Baseline 06-06-20, 06-12-20, 07-03-20    Status Achieved      SLP LONG TERM GOAL #3   Title pt will demonstrate understanding/report understanding of  written cues to perform household duties and tasks with 100% accuracy x2 sessions    Baseline 06-01-20, 06-12-20    Status Achieved      SLP LONG TERM GOAL #4   Title pt will demonstrate appropriate topic maintenance in 10 minutes conversation, with compensatory measures x3 sessions    Baseline 06-12-20, 07-03-20    Status Achieved      SLP LONG TERM GOAL #5   Title pt's score on QOL measure for communication will increase to >18    Baseline 15    Time --    Period --    Status Partially Met            Plan - 07/10/20 1657    Clinical Impression Statement Jesse Bowers presents today with improved receptive language and minimal expressive langauge deficits from a lt CVA in 2015. Pt demonstrated occasional halting/pausing and word finding this session, seemingly related to "brain fatigue." Pt able to effecitvley maintain topics this session without cues. Pt reports overall improvements in communication. Pt stated "I have the tools I needed. Now I just have to...practice." Pt requested additional resources for further practice at home. Pt verbalized understanding and agreement with ST discharge at this time. Pt aware of ability to request additional script if change or decline in cognitive communication exhibited s/p ST discharge.    Treatment/Interventions Compensatory techniques;Functional tasks;Environmental controls;Multimodal communcation approach;Language facilitation;SLP instruction and feedback;Patient/family education;Internal/external aids;Cognitive reorganization    Potential to Achieve Goals Fair    Potential Considerations Co-morbidities    SLP Home Exercise Plan provided    Consulted and Agree with Plan of Care Patient           Patient will benefit from skilled therapeutic intervention in order to improve the following deficits and impairments:   Aphasia  Cognitive communication deficit    Problem List Patient Active Problem List   Diagnosis Date Noted  . Pseudophakia of both  eyes  05/07/2020  . Retinal telangiectasis of both eyes 05/07/2020  . Lattice degeneration of peripheral retina, left 09/08/2019  . Lattice degeneration, right eye 09/08/2019  . Chronic diastolic heart failure ([place]) 05/24/2019  . Urinary dysfunction 04/12/2019  . Osteoarthritis of right knee 08/23/2018  . Constipation due to opioid therapy 03/30/2018  . SNHL (sensorineural hearing loss) 12/04/2017  . Abnormal urinary stream 12/03/2017  . Osteoarthritis of carpometacarpal (CMC) joint of thumb 11/30/2017  . Muscle weakness 11/17/2017  . Transient vision disturbance 11/12/2017  . Bilateral hand pain 10/30/2017  . Pain of left hip joint 10/09/2017  . Gynecomastia 07/10/2017  . Iron deficiency anemia 07/05/2017  . Spasticity 05/20/2017  . Lumbar radiculitis 04/20/2017  . Ulnar 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, left 11/28/2016  . Idiopathic peripheral neuropathy 11/28/2016  . 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 Long term (current) use of anticoagulants 08/23/2015  . Right upper quadrant abdominal pain 08/23/2015  . Memory loss 05/10/2015  . Gait abnormality 04/07/2015  . Alkaline phosphatase elevation 04/07/2015  . History of thrombosis 03/26/2015  . Medial epicondylitis 02/07/2015  . Cognitive decline 12/21/2014  . Leukopenia 12/05/2014  . Rotator cuff syndrome of right shoulder 10/27/2014  . Status post left knee replacement 08/22/2014  . Left lateral epicondylitis 08/22/2014  . Chronic cerebral ischemia 08/18/2014  . Arthrofibrosis of knee joint 08/17/2014  . Cubital canal compression syndrome, left 08/17/2014  . 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 Chronic pain syndrome 04/10/2014  . Insomnia 04/10/2014  . Chronic non-specific white matter lesions on MRI 04/10/2014  . CFS (chronic fatigue syndrome) 04/10/2014  . 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 Biceps tendonitis on left 03/01/2014  . Polycythemia vera ([place]) 12/27/2013  . H/O TIA (transient ischemic attack) and  stroke 12/27/2013  . Neck pain 12/27/2013  . OSA (obstructive sleep apnea) 12/08/2013  . 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 Depression with anxiety 08/04/2013  . Protein C deficiency ([place]) 08/01/2013  . Post traumatic stress disorder (PTSD) 08/01/2013  . Speech abnormality 07/25/2013  . Obesity 05/10/2013  . Lower extremity edema 05/10/2013  . GERD (gastroesophageal reflux disease) 05/10/2013  . Arthritis 05/10/2013  . OA (osteoarthritis) of knee 03/15/2013  . Headache 10/25/2012  . Palpitations 10/18/2012  . Fatty liver determined by biopsy 06/01/2012  . Arthropathic psoriasis, unspecified ([place]) 04/29/2012  . Abnormal liver enzymes 03/29/2012  . Psoriatic arthritis ([place]) 12/29/2011  . Left Renal Hydronephrosis 12/11/2010  . 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 Celiac disease 05/27/2010  . Thyroid nodule 05/27/2010  . Male-to-male transgender person 09/20/2002    Alinda Deem, MA CCC-SLP 07/10/2020, 6:47 PM  [facility] [address] [place], [place], [address] Phone: [phone]   Fax:  [phone]   Name: Jesse Bowers MRN: [id number] Date of Birth: October 15, 1965

## 2020-07-13 MED ORDER — LUBIPROSTONE 8 MCG PO CAPS: 8.0000 ug | ORAL_CAPSULE | Freq: Two times a day (BID) | ORAL | 5 refills | Status: DC

## 2020-07-13 NOTE — Telephone Encounter (Signed)
rx for amitiza sent to pharmacy

## 2020-07-16 ENCOUNTER — Encounter (INDEPENDENT_AMBULATORY_CARE_PROVIDER_SITE_OTHER): Payer: BC Managed Care – PPO | Admitting: Ophthalmology

## 2020-07-16 ENCOUNTER — Ambulatory Visit: Payer: BC Managed Care – PPO | Admitting: Psychology

## 2020-07-19 ENCOUNTER — Ambulatory Visit (INDEPENDENT_AMBULATORY_CARE_PROVIDER_SITE_OTHER): Payer: BC Managed Care – PPO | Admitting: Psychology

## 2020-07-19 DIAGNOSIS — F4312 Post-traumatic stress disorder, chronic: Secondary | ICD-10-CM

## 2020-07-19 DIAGNOSIS — F4323 Adjustment disorder with mixed anxiety and depressed mood: Secondary | ICD-10-CM | POA: Diagnosis not present

## 2020-07-23 ENCOUNTER — Ambulatory Visit: Payer: BC Managed Care – PPO | Admitting: Psychology

## 2020-07-24 ENCOUNTER — Encounter (INDEPENDENT_AMBULATORY_CARE_PROVIDER_SITE_OTHER): Payer: Self-pay | Admitting: Ophthalmology

## 2020-07-24 ENCOUNTER — Other Ambulatory Visit: Payer: Self-pay

## 2020-07-24 ENCOUNTER — Ambulatory Visit (INDEPENDENT_AMBULATORY_CARE_PROVIDER_SITE_OTHER): Payer: BC Managed Care – PPO | Admitting: Ophthalmology

## 2020-07-24 DIAGNOSIS — H35073 Retinal telangiectasis, bilateral: Secondary | ICD-10-CM

## 2020-07-24 NOTE — Assessment & Plan Note (Signed)
Patient has continued to use BiPAP with improved adherence more over and improved sense of wellbeing on BiPAP.  Has no ongoing worsening of his visual symptoms.  I did point out to the patient that he is 20/60 vision which improves with pinhole to 20/40 would suggest that he might need a corrective lens for distance vision should he choose to undergo and seek that

## 2020-07-24 NOTE — Progress Notes (Signed)
07/24/2020     CHIEF COMPLAINT Patient presents for Retina Follow Up (9 Wk F/U OU//Pt reports [facility] fluctuates off and on OU. No new symptoms reported OU./SP02 - 86-100 while sleeping per pt. Pt sts he is on BIPAP. Pt sts he did not purchase the Well Nu oximeter ring.)   HISTORY OF PRESENT ILLNESS: Jesse Bowers is a 55 y.o. adult who presents to the clinic today for:   HPI    Retina Follow Up    Diagnosis: Other   Laterality: both eyes   Onset: 9 weeks ago   Severity: moderate   Duration: 9 weeks   Course: stable   Comments: 9 Wk F/U OU  Pt reports VA fluctuates off and on OU. No new symptoms reported OU. SP02 - 86-100 while sleeping per pt. Pt sts he is on BIPAP. Pt sts he did not purchase the Well Nu oximeter ring.       Last edited by Rockie Neighbours, [place] on 07/24/2020  2:31 PM. (History)      Referring physician: Shawnee Knapp, MD [place],  [place] [address]  HISTORICAL INFORMATION:   Selected notes from the MEDICAL RECORD NUMBER       CURRENT MEDICATIONS: No current outpatient medications on file. (Ophthalmic Drugs)   No current facility-administered medications for this visit. (Ophthalmic Drugs)   Current Outpatient Medications (Other)  Medication Sig  . acyclovir ointment (ZOVIRAX) 5 % as needed.  . baclofen (LIORESAL) 20 MG tablet TAKE 1 TABLET BY MOUTH 4 TIMES DAILY.  . Calcipotriene-Betameth Diprop 0.005-0.064 % FOAM Apply 1 application topically 2 (two) times daily as needed.   . clorazepate (TRANXENE) 15 MG tablet Take 15 mg by mouth at bedtime.  . clorazepate (TRANXENE) 7.5 MG tablet Take 7.5 mg by mouth 2 (two) times daily.  . COSENTYX SENSOREADY, 300 MG, 150 MG/ML SOAJ Inject 2 Syringes into the skin every 28 (twenty-eight) days.  . Cyanocobalamin (VITAMIN B-12) 5000 MCG SUBL Place 5,000 mcg under the tongue daily.  [name] desonide (DESOWEN) 0.05 % ointment Apply 1 application topically 2 (two) times daily as needed (psoriasis).   [name] ergocalciferol  (VITAMIN D2) 1.25 MG (50000 UT) capsule Take 1 capsule by mouth once a week.  . finasteride (PROPECIA) 1 MG tablet Take 1 mg by mouth daily.  . Fluocinolone Acetonide 0.01 % OIL Place 3 drops into both ears 2 (two) times daily as needed for itching.  . folic acid (FOLVITE) 1 MG tablet Take 1 mg by mouth daily.  . Furosemide (LASIX PO) Take 60 mg by mouth daily.  [name] lubiprostone (AMITIZA) 8 MCG capsule Take 1 capsule (8 mcg total) by mouth 2 (two) times daily with a meal.  . methotrexate (RHEUMATREX) 2.5 MG tablet Take 10 mg by mouth once a week. Caution:Chemotherapy. Protect from light.  . morphine (KADIAN) 60 MG 24 hr capsule Take 1 capsule (60 mg total) by mouth every 12 (twelve) hours.  [name] neomycin-polymyxin-hydrocortisone (CORTISPORIN) 3.5-10000-1 OTIC suspension Place 4 drops into both ears 2 (two) times daily as needed (ear pain).   . Oxycodone HCl 10 MG TABS Take 1 tablet (10 mg total) by mouth every 4 (four) hours as needed.  . potassium chloride (KLOR-CON) 10 MEQ tablet TAKE 2 TABLETS BY MOUTH EVERY DAY  . rivaroxaban (XARELTO) 20 MG TABS tablet TAKE 1 TABLET(20 MG) BY MOUTH EVERY NIGHT AT BEDTIME  . testosterone cypionate (DEPOTESTOSTERONE CYPIONATE) 200 MG/ML injection INJECT 0.5 MLS (100 MG TOTAL) INTO THE  MUSCLE ONCE A WEEK.  . ursodiol (ACTIGALL) 250 MG tablet Take 250 mg by mouth 2 (two) times daily with a meal.   . ursodiol (ACTIGALL) 500 MG tablet Take 1 tablet midday with lunch and 1 tablet in the evening with dinneer  . venlafaxine XR (EFFEXOR-XR) 150 MG 24 hr capsule Take 2 capsules (300 mg total) by mouth daily with breakfast.   No current facility-administered medications for this visit. (Other)      REVIEW OF SYSTEMS:    ALLERGIES Allergies  Allergen Reactions  . Penicillin G Anaphylaxis and Other (See Comments)    Has patient had a PCN reaction causing immediate rash, facial/tongue/throat swelling, SOB or lightheadedness with hypotension: Yes Has patient had a PCN  reaction causing severe rash involving mucus membranes or skin necrosis: No Has patient had a PCN reaction that required hospitalization Yes Has patient had a PCN reaction occurring within the last 10 years: No If all of the above answers are "NO", then may proceed with Cephalosporin use.   . Sulfa Antibiotics Rash    Stevens-Johnson rash  . Tegaderm Ag Mesh [Silver] Dermatitis    Causes blistering wounds   . Ibuprofen Other (See Comments)    Contraindicated with Xarelto.   . Pregabalin Other (See Comments)    Ineffective  . Sulfacetamide Sodium-Sulfur Rash    PAST MEDICAL HISTORY Past Medical History:  Diagnosis Date  . Abnormal weight loss   . Anxiety   . Arthritis   . Cataract    OU  . Celiac disease   . Cervical neck pain with evidence of disc disease    patient has a cyst   . Chronic constipation   . Chronic diastolic heart failure ([place])    Pt. denies  . Chronic pain   . Degenerative disc disease at L5-S1 level    with stenosis  . DVT (deep venous thrombosis) ([facility])    Right upper arm, bilateral leg  . Eczema    inguinal, feet  . Elevated liver enzymes   . 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 Family history of adverse reaction to anesthesia    family has problems with anesthesia of nausea and vomiting   . 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 GERD (gastroesophageal reflux disease)    History of in 20's  . Gluten enteropathy   . H/O parotitis    right   . Hard of hearing   . History of kidney stones   . History of retinal tear    Bilateral  . History of staph infection    required wound vac  . Hx-TIA (transient ischemic attack)    2015  . LVH (left ventricular hypertrophy) 12/15/2016   Mild, noted on ECHO  . MVP (mitral valve prolapse)   . NAFL (nonalcoholic fatty liver)   . Neck pain   . Neuromuscular disorder ([place])    bilateral neuropathy feet.  . Nuclear sclerotic cataract of both eyes 09/08/2019  . Pneumonia 12/17/2010  . Polycythemia    . Polycythemia, secondary   . PONV (postoperative nausea and vomiting)   . Protein C deficiency ([place])    Dr. Anne Fu  . Psoriasis    16 X10 cm psoriatic rash on sole of left foot ; open and occ scant bleeding;   . psoriatic arthritis   . PTSD (post-traumatic stress disorder)   . Scaphoid fracture of wrist 09/23/2013  . Seizure ([place])    childhood, medication until age 89 then weanned completely off  .  Sleep apnea    split night study last done by Dr. [name] 06/18/15 shows severe OSA, CSA, and hypersomnia, rec bipap  . Splenomegaly   . Stenosis of ureteropelvic junction (UPJ)    left  . 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 [name] follows  . 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 Tachycardia    hx of   . Transfusion history    past history- none recent, after surgeries due to blood loss  . Wears glasses   . Wears hearing aid    Past Surgical History:  Procedure Laterality Date  . ABDOMINAL HYSTERECTOMY Bilateral 1994   TAH, BSO- tranverse incision at 55 yo  . ANKLE ARTHROSCOPY WITH RECONSTRUCTION Right 2007  . CHOLECYSTECTOMY     laparoscopic  . COLONOSCOPY     x3  . EYE SURGERY     Left eye 03/02/2018, right 02/15/2018  . HIP ARTHROSCOPY W/ LABRAL REPAIR Right 05/11/2013   acetabular labral tear 03/30/2013  . KNEE ARTHROPLASTY Right   . KNEE JOINT MANIPULATION Left    x3 under anesthesia  . KNEE SURGERY Bilateral 1984   Right ACL, left PCL repair  . LITHOTRIPSY  2005  . LIVER BIOPSY  2013   normal results.  [name] MASTECTOMY Bilateral    prior to 2009  . MOUTH SURGERY    . NASAL SEPTUM SURGERY N/A 09/20/2015   by ENT Dr. [name]  . OVARIAN CYST SURGERY Left    size of grapefruit, was informed that she had shortened vagina  . SHOULDER SURGERY Bilateral    Right 08/15/2016, Left 11/15/2016  . THUMB ARTHROSCOPY Left   . THYROIDECTOMY, PARTIAL Left 2008  . TOTAL KNEE ARTHROPLASTY Right 08/23/2018    Procedure: TOTAL KNEE ARTHROPLASTY;  Surgeon: [name], MD;  Location: [facility];  Service: Orthopedics;  Laterality: Right;  59mn  . TOTAL KNEE REVISION Left 02/06/2016   Procedure: LEFT TOTAL KNEE REVISION;  Surgeon: FGaynelle Arabian MD;  Location: [facility];  Service: Orthopedics;  Laterality: Left;  . TOTAL KNEE REVISION Left 04/22/2017   Procedure: Left knee polyethylene revision;  Surgeon: AGaynelle Arabian MD;  Location: [facility];  Service: Orthopedics;  Laterality: Left;  . UPPER GI ENDOSCOPY  2003    FAMILY HISTORY Family History  Problem Relation Age of Onset  . Stroke Maternal Grandfather        59 . Heart attack Maternal Grandfather   . Glaucoma Maternal Grandfather   . Macular degeneration Maternal Grandfather   . Breast cancer Sister   . Hypertension Mother   . Psoriasis Mother   . Other Mother        meningioma developed ~2019  . Glaucoma Mother   . Cancer Paternal Grandfather   . Heart attack Paternal Grandfather   . Stroke Paternal Uncle        age 55 . Polycythemia Paternal Uncle   . Stroke Maternal Grandmother   . Congestive Heart Failure Maternal Grandmother   . Heart attack Maternal Grandmother   . Protein C deficiency Sister 379      Miscarriages  . Breast cancer Maternal Aunt 340   SOCIAL HISTORY Social History   Tobacco Use  . Smoking status: Never Smoker  . Smokeless tobacco: Never Used  Vaping Use  . Vaping Use: Never used  Substance Use Topics  . Alcohol use: Yes    Comment: social  .  Drug use: No         OPHTHALMIC EXAM: Base Eye Exam    Visual Acuity (ETDRS)      Right Left   Dist [place] 20/60 20/60   Dist ph [place] 20/40 20/40       Tonometry (Tonopen, 2:38 PM)      Right Left   Pressure 12 11       Pupils      Pupils Dark Light Shape React APD   Right PERRL 3 2 Round Brisk None   Left PERRL 3 2 Round Brisk None       Visual Fields (Counting fingers)      Left Right    Full Full       Extraocular Movement      Right Left     Full Full       Neuro/Psych    Oriented x3: Yes   Mood/Affect: Normal       Dilation    Both eyes: 1.0% Mydriacyl, 2.5% Phenylephrine @ 2:38 PM        Slit Lamp and Fundus Exam    External Exam      Right Left   External Normal Normal       Slit Lamp Exam      Right Left   Lids/Lashes Normal Normal   Conjunctiva/Sclera White and quiet White and quiet   Cornea Clear Clear   Anterior Chamber Deep and quiet Deep and quiet   Iris Round and reactive Round and reactive   Lens Centered posterior chamber intraocular lens Centered posterior chamber intraocular lens   Anterior Vitreous Normal Normal       Fundus Exam      Right Left   Posterior Vitreous Normal Normal   Disc Normal Normal   C/D Ratio 0.2 0.2   Macula Normal Normal   Vessels Normal Normal   Periphery Lattice generation superonasal, inferonasal and inferotemporal each with good laser photocoagulation reaction. Patient inferotemporal no treatment needed at 530, pexy around lattice degeneration superotemporal.          IMAGING AND PROCEDURES  Imaging and Procedures for 07/24/20  OCT, Retina - OU - Both Eyes       Right Eye Quality was good. Scan locations included subfoveal. Central Foveal Thickness: 290. Progression has been stable. Findings include normal foveal contour.   Left Eye Quality was good. Scan locations included subfoveal. Central Foveal Thickness: 288. Progression has been stable. Findings include normal foveal contour.   Notes   Very minor micro-CME may be seen in the inner perifoveal regions but these are not pathologic, from examination 2 weeks prior yet these appear to have be slightly improved On high-definition OCT today in addition to improved thickening OU with only a 1 week use of excellent fitting BiPAP,  I encourage patient to continue on the BiPAP/CPAP whichever mask works best                    ASSESSMENT/PLAN:  Retinal telangiectasis of both eyes Patient has  continued to use BiPAP with improved adherence more over and improved sense of wellbeing on BiPAP.  Has no ongoing worsening of his visual symptoms.  I did point out to the patient that he is 20/60 vision which improves with pinhole to 20/40 would suggest that he might need a corrective lens for distance vision should he choose to undergo and seek that      ICD-10-CM   1. Retinal telangiectasis of  both eyes  H35.073 OCT, Retina - OU - Both Eyes    1.  No active micro CME seen today.  Improved recent compliance with BiPAP and will use using nightly oximetry readings off of his apple watch only with occasional dips into the low 80% range  2.  We did discuss the use of probiotics to decrease inflammation from the gut and its potential effect on diffuse inflammation throughout the body 3.  Ophthalmic Meds Ordered this visit:  No orders of the defined types were placed in this encounter.      Return in about 1 year (around 07/24/2021) for DILATE OU, OCT.  There are no Patient Instructions on file for this visit.   Explained the diagnoses, plan, and follow up with the patient and they expressed understanding.  Patient expressed understanding of the importance of proper follow up care.   Clent Demark Curtis Cain M.D. Diseases & Surgery of the Retina and Vitreous Retina & Diabetic [place] 07/24/20     Abbreviations: M myopia (nearsighted); A astigmatism; H hyperopia (farsighted); P presbyopia; Mrx spectacle prescription;  CTL contact lenses; OD right eye; OS left eye; OU both eyes  XT exotropia; ET esotropia; PEK punctate epithelial keratitis; PEE punctate epithelial erosions; DES dry eye syndrome; MGD meibomian gland dysfunction; ATs artificial tears; PFAT's preservative free artificial tears; [place] nuclear sclerotic cataract; PSC posterior subcapsular cataract; ERM epi-retinal membrane; PVD posterior vitreous detachment; RD retinal detachment; DM diabetes mellitus; DR diabetic retinopathy; NPDR  non-proliferative diabetic retinopathy; PDR proliferative diabetic retinopathy; CSME clinically significant macular edema; DME diabetic macular edema; dbh dot blot hemorrhages; CWS cotton wool spot; POAG primary open angle glaucoma; C/D cup-to-disc ratio; HVF humphrey visual field; GVF goldmann visual field; OCT optical coherence tomography; IOP intraocular pressure; BRVO Branch retinal vein occlusion; CRVO central retinal vein occlusion; CRAO central retinal artery occlusion; BRAO branch retinal artery occlusion; RT retinal tear; SB scleral buckle; PPV pars plana vitrectomy; VH Vitreous hemorrhage; PRP panretinal laser photocoagulation; IVK intravitreal kenalog; VMT vitreomacular traction; MH Macular hole;  NVD neovascularization of the disc; NVE neovascularization elsewhere; AREDS age related eye disease study; ARMD age related macular degeneration; POAG primary open angle glaucoma; EBMD epithelial/anterior basement membrane dystrophy; ACIOL anterior chamber intraocular lens; IOL intraocular lens; PCIOL posterior chamber intraocular lens; Phaco/IOL phacoemulsification with intraocular lens placement; [place] photorefractive keratectomy; LASIK laser assisted in situ keratomileusis; HTN hypertension; DM diabetes mellitus; COPD chronic obstructive pulmonary disease

## 2020-07-25 ENCOUNTER — Encounter: Payer: Self-pay | Admitting: *Deleted

## 2020-07-26 NOTE — Progress Notes (Signed)
07/27/2020 3:49 PM   Jesse Bowers Jul 04, 1965 [id number]  Referring provider: Shawnee Knapp, MD [address] [place],  [place] [address]  Chief Complaint  Patient presents with   Follow-up   Urological history: 1. Left UPJ obstruction -creatinine 1.12 in 06/2020  -Renal lasix scan from 04/19/2019 normal right kidney with non obstructive hydronephrosis of the left kidney with normal clearance from the mildly dilated left renal pelvis following Lasix administration  2. OAB -contributing factors of age, stroke, sleep apnea and depression -PVR 73 mL    HPI: Jesse Bowers is a 55 y.o. who presents today for yearly follow up.    He continues to have issues with urination.  He has moments where he is able to void with a good stream and other times where he has severe urinary hesitancy.  He has also been experiencing right sided flank pain for he last several month associated with LBP.    He has not been taking the Myrbetriq.    Patient denies any modifying or aggravating factors.  Patient denies any gross hematuria, dysuria or suprapubic/flank pain.  Patient denies any fevers, chills, nausea or vomiting.   PMH: Past Medical History:  Diagnosis Date   Abnormal weight loss    Anxiety    Arthritis    Cataract    OU   Celiac disease    Cervical neck pain with evidence of disc disease    patient has a cyst    Chronic constipation    Chronic diastolic heart failure (HCC)    Pt. 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   Family history of adverse reaction to anesthesia    family has problems with anesthesia of nausea and vomiting    Male-to-male transgender person    GERD (gastroesophageal reflux disease)    History of in 20's   Gluten enteropathy    H/O  parotitis    right    Hard of hearing    History of kidney stones    History of retinal tear    Bilateral   History of staph infection    required wound vac   Hx-TIA (transient ischemic attack)    2015   LVH (left ventricular hypertrophy) 12/15/2016   Mild, noted on ECHO   MVP (mitral valve prolapse)    NAFL (nonalcoholic fatty liver)    Neck pain    Neuromuscular disorder ([place])    bilateral neuropathy feet.   Nuclear sclerotic cataract of both eyes 09/08/2019   Pneumonia 12/17/2010   Polycythemia    Polycythemia, secondary    PONV (postoperative nausea and vomiting)    Protein C deficiency (HCC)    Dr. Anne Fu   Psoriasis    16 X10 cm psoriatic rash on sole of left foot ; open and occ scant bleeding;    psoriatic arthritis    PTSD (post-traumatic stress disorder)    Scaphoid fracture of wrist 09/23/2013   Seizure ([place])    childhood, medication until age 72 then weanned completely off   Sleep apnea    split night study last done by Dr. 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   c spine on MRI   Tachycardia    hx of    Transfusion history    past history- none recent, after surgeries due to blood loss   Wears glasses    Wears hearing aid     Surgical History: Past Surgical History:  Procedure Laterality Date   ABDOMINAL HYSTERECTOMY Bilateral 1994   TAH, BSO- tranverse incision at 55 yo   ANKLE ARTHROSCOPY WITH RECONSTRUCTION Right 2007   CHOLECYSTECTOMY     laparoscopic   COLONOSCOPY     x3   EYE SURGERY     Left eye 03/02/2018, right 02/15/2018   HIP ARTHROSCOPY W/ LABRAL REPAIR Right 05/11/2013   acetabular labral tear 03/30/2013   KNEE ARTHROPLASTY Right    KNEE JOINT MANIPULATION Left    x3 under anesthesia   KNEE  SURGERY Bilateral 1984   Right ACL, left PCL repair   LITHOTRIPSY  2005   LIVER BIOPSY  2013   normal results.   MASTECTOMY Bilateral    prior to 2009   MOUTH SURGERY     NASAL SEPTUM SURGERY N/A 09/20/2015   by ENT Dr. [name]   OVARIAN CYST SURGERY Left    size of grapefruit, was informed that she had shortened vagina   SHOULDER SURGERY Bilateral    Right 08/15/2016, Left 11/15/2016   THUMB ARTHROSCOPY Left    THYROIDECTOMY, PARTIAL Left 2008   TOTAL KNEE ARTHROPLASTY Right 08/23/2018   Procedure: TOTAL KNEE ARTHROPLASTY;  Surgeon: [name], MD;  Location: [facility];  Service: Orthopedics;  Laterality: Right;  71mn   TOTAL KNEE REVISION Left 02/06/2016   Procedure: LEFT TOTAL KNEE REVISION;  Surgeon: FGaynelle Arabian MD;  Location: [facility];  Service: Orthopedics;  Laterality: Left;   TOTAL KNEE REVISION Left 04/22/2017   Procedure: Left knee polyethylene revision;  Surgeon: AGaynelle Arabian MD;  [facility];  Service: Orthopedics;  Laterality: Left;   UPPER GI ENDOSCOPY  2003    Home Medications:  Allergies as of 07/27/2020       Reactions   Penicillin G Anaphylaxis, Other (See Comments)   Has patient had a PCN reaction causing immediate rash, facial/tongue/throat swelling, SOB or lightheadedness with hypotension: Yes Has patient had a PCN reaction causing severe rash involving mucus membranes or skin necrosis: No Has patient had a PCN reaction that required hospitalization Yes Has patient had a PCN reaction occurring within the last 10 years: No If all of the above answers are "NO", then may proceed with Cephalosporin use.   Sulfa Antibiotics Rash   Stevens-Johnson rash   Tegaderm Ag Mesh [silver] Dermatitis   Causes blistering wounds   Ibuprofen Other (See Comments)   Contraindicated with Xarelto.    Pregabalin Other (See Comments)   Ineffective   Sulfacetamide Sodium-sulfur Rash        Medication List        Accurate as of Jul 27, 2020 11:59 PM. If you have any  questions, ask your nurse or doctor.          STOP taking these medications    LASIX PO Stopped by: Alanah Sakuma, PA-C   methotrexate 2.5 MG tablet Commonly known as: RHEUMATREX Stopped by: SZara Council PA-C       TAKE these medications  acyclovir ointment 5 % Commonly known as: ZOVIRAX as needed.   baclofen 20 MG tablet Commonly known as: LIORESAL TAKE 1 TABLET BY MOUTH 4 TIMES DAILY.   Calcipotriene-Betameth Diprop 0.005-0.064 % Foam Apply 1 application topically 2 (two) times daily as needed.   clorazepate 7.5 MG tablet Commonly known as: TRANXENE Take 7.5 mg by mouth 2 (two) times daily.   clorazepate 15 MG tablet Commonly known as: TRANXENE Take 15 mg by mouth at bedtime.   Cosentyx Sensoready (300 MG) 150 MG/ML [facility] Generic drug: Secukinumab (300 MG Dose) Inject 2 Syringes into the skin every 28 (twenty-eight) days.   desonide 0.05 % ointment Commonly known as: DESOWEN Apply 1 application topically 2 (two) times daily as needed (psoriasis).   ergocalciferol 1.25 MG (50000 UT) capsule Commonly known as: VITAMIN D2 Take 1 capsule by mouth once a week.   finasteride 1 MG tablet Commonly known as: PROPECIA Take 1 mg by mouth daily.   Fluocinolone Acetonide 0.01 % Oil Place 3 drops into both ears 2 (two) times daily as needed for itching.   folic acid 1 MG tablet Commonly known as: FOLVITE Take 1 mg by mouth daily.   lubiprostone 8 MCG capsule Commonly known as: Amitiza Take 1 capsule (8 mcg total) by mouth 2 (two) times daily with a meal.   morphine 60 MG 24 hr capsule Commonly known as: Kadian Take 1 capsule (60 mg total) by mouth every 12 (twelve) hours.   neomycin-polymyxin-hydrocortisone 3.5-10000-1 OTIC suspension Commonly known as: CORTISPORIN Place 4 drops into both ears 2 (two) times daily as needed (ear pain).   Oxycodone HCl 10 MG Tabs Take 1 tablet (10 mg total) by mouth every 4 (four) hours as needed.   potassium  chloride 10 MEQ tablet Commonly known as: KLOR-CON TAKE 2 TABLETS BY MOUTH EVERY DAY   rivaroxaban 20 MG Tabs tablet Commonly known as: Xarelto TAKE 1 TABLET(20 MG) BY MOUTH EVERY NIGHT AT BEDTIME   testosterone cypionate 200 MG/ML injection Commonly known as: DEPOTESTOSTERONE CYPIONATE INJECT 0.5 MLS (100 MG TOTAL) INTO THE MUSCLE ONCE A WEEK.   ursodiol 250 MG tablet Commonly known as: ACTIGALL Take 250 mg by mouth 2 (two) times daily with a meal.   ursodiol 500 MG tablet Commonly known as: ACTIGALL Take 1 tablet midday with lunch and 1 tablet in the evening with dinneer   venlafaxine XR 150 MG 24 hr capsule Commonly known as: EFFEXOR-XR Take 2 capsules (300 mg total) by mouth daily with breakfast.   Vitamin B-12 5000 MCG Subl Place 5,000 mcg under the tongue daily.        Allergies:  Allergies  Allergen Reactions   Penicillin G Anaphylaxis and Other (See Comments)    Has patient had a PCN reaction causing immediate rash, facial/tongue/throat swelling, SOB or lightheadedness with hypotension: Yes Has patient had a PCN reaction causing severe rash involving mucus membranes or skin necrosis: No Has patient had a PCN reaction that required hospitalization Yes Has patient had a PCN reaction occurring within the last 10 years: No If all of the above answers are "NO", then may proceed with Cephalosporin use.    Sulfa Antibiotics Rash    Stevens-Johnson rash   Tegaderm Ag Mesh [Silver] Dermatitis    Causes blistering wounds    Ibuprofen Other (See Comments)    Contraindicated with Xarelto.    Pregabalin Other (See Comments)    Ineffective   Sulfacetamide Sodium-Sulfur Rash    Family History: Family History  Problem Relation Age of Onset   Stroke Maternal Grandfather        1   Heart attack Maternal Grandfather    Glaucoma Maternal Grandfather    Macular degeneration Maternal Grandfather    Breast cancer Sister    Hypertension Mother    Psoriasis Mother     Other Mother        meningioma developed ~2019   Glaucoma Mother    Cancer Paternal Grandfather    Heart attack Paternal Grandfather    Stroke Paternal Uncle        age 60   Polycythemia Paternal Uncle    Stroke Maternal Grandmother    Congestive Heart Failure Maternal Grandmother    Heart attack Maternal Grandmother    Protein C deficiency Sister 93       Miscarriages   Breast cancer Maternal Aunt 94    Social History:  reports that he has never smoked. He has never used smokeless tobacco. He reports current alcohol use. He reports that he does not use drugs.  ROS: For pertinent review of systems please refer to history of present illness  Physical Exam: BP 138/78   Ht 5' 9"  (1.753 m)   Wt 221 lb (100.2 kg)   BMI 32.64 kg/m   Constitutional:  Well nourished. Alert and oriented, No acute distress. HEENT: [place] AT, mask in place.  Trachea midline Cardiovascular: No clubbing, cyanosis, or edema. Respiratory: Normal respiratory effort, no increased work of breathing. Neurologic: Grossly intact, no focal deficits, moving all 4 extremities.  In wheelchair.   Psychiatric: Normal mood and affect.  Laboratory Data: Lab Results  Component Value Date   WBC 3.3 (L) 05/24/2020   HGB 14.2 05/24/2020   HCT 45.1 05/24/2020   MCV 83.2 05/24/2020   PLT 161 05/24/2020    Lab Results  Component Value Date   CREATININE 1.16 05/24/2020    Lab Results  Component Value Date   AST 29 05/24/2020   Lab Results  Component Value Date   ALT 29 05/24/2020  I have reviewed the labs.   Pertinent Imaging: Results for ELIMELECH, HOUSEMAN (MRN [id number]) as of 08/06/2020 15:57  Ref. Range 08/06/2020 15:56  Scan Result Unknown 73 mL     Assessment & Plan:    1. Flank pain -CT scan from 2021 was negative for stones -he is due for a renal lasix scan at this time so we will go ahead and proceed with this study   2. Chronic left UPJ obstruction -see # 2   Return in about 2 weeks (around  08/10/2020) for renal lasix scan results .  These notes generated with voice recognition software. I apologize for typographical errors.  Zara Council, PA-C  [name] [address]  [place], [place] [address] [phone]

## 2020-07-27 ENCOUNTER — Other Ambulatory Visit: Payer: Self-pay

## 2020-07-27 ENCOUNTER — Ambulatory Visit (INDEPENDENT_AMBULATORY_CARE_PROVIDER_SITE_OTHER): Payer: BC Managed Care – PPO | Admitting: Urology

## 2020-07-27 ENCOUNTER — Encounter: Payer: Self-pay | Admitting: Urology

## 2020-07-27 VITALS — BP 138/78 | Ht 69.0 in | Wt 221.0 lb

## 2020-07-27 DIAGNOSIS — N3281 Overactive bladder: Secondary | ICD-10-CM | POA: Diagnosis not present

## 2020-07-27 DIAGNOSIS — Q6239 Other obstructive defects of renal pelvis and ureter: Secondary | ICD-10-CM

## 2020-08-01 ENCOUNTER — Other Ambulatory Visit: Payer: Self-pay

## 2020-08-01 ENCOUNTER — Encounter
Payer: BC Managed Care – PPO | Attending: Physical Medicine & Rehabilitation | Admitting: Physical Medicine & Rehabilitation

## 2020-08-01 ENCOUNTER — Encounter: Payer: Self-pay | Admitting: Physical Medicine & Rehabilitation

## 2020-08-01 DIAGNOSIS — L4050 Arthropathic psoriasis, unspecified: Secondary | ICD-10-CM | POA: Diagnosis not present

## 2020-08-01 DIAGNOSIS — M5416 Radiculopathy, lumbar region: Secondary | ICD-10-CM

## 2020-08-01 MED ORDER — MORPHINE SULFATE ER 50 MG PO CP24: 50.0000 mg | ORAL_CAPSULE | Freq: Every day | ORAL | 0 refills | Status: DC

## 2020-08-01 NOTE — Progress Notes (Signed)
Subjective:    Patient ID: Jesse Bowers, male. DOB: 04/05/1965, 55 y.o.   MRN: [id number]  HPI  Jesse Bowers is here in follow up of his chronic pain. His mtx was increased to 43m 2 weeks ago and he's noticed further improvement in his joint pain. His low back pain is still problematic. In fact the back pain has worsened over the last several months. It tends to bother him when he's sitting upright and worsens if he flexes at all with radiation of pain into either leg. He also notes associated numbness in his feet. Pain can be disabling preventing him from transferring or even moving in bed.   His most recent MRI is from 04/2016 which demonstrates severe disc disease with facet arthropathy and mild foraminal stenosis at L5-S1. Finding less severe above   Pain Inventory Average Pain 5 Pain Right Now 6 My pain is constant, sharp, burning, stabbing and tingling  LOCATION OF PAIN  Neck, wrist, hand , fingers, back, hip, knee, ankle  BOWEL Number of stools per week: 1 Oral laxative use Yes  Type of laxative . Enema or suppository use No  History of colostomy No  Incontinent No   BLADDER Normal In and out cath, frequency na Able to self cath na Bladder incontinence No  Frequent urination No  Leakage with coughing No  Difficulty starting stream Yes  Incomplete bladder emptying Yes    Mobility how many minutes can you walk? 5 ability to climb steps?  yes do you drive?  yes use a wheelchair transfers alone  Function disabled: date disabled 08/2016 I need assistance with the following:  feeding, dressing, bathing, meal prep, household duties and shopping  Neuro/Psych weakness numbness tremor tingling trouble walking spasms anxiety  Prior Studies Any changes since last visit?  no  Physicians involved in your care Any changes since last visit?  no   Family History  Problem Relation Age of Onset  . Stroke Maternal Grandfather        523 . Heart attack Maternal  Grandfather   . Glaucoma Maternal Grandfather   . Macular degeneration Maternal Grandfather   . Breast cancer Sister   . Hypertension Mother   . Psoriasis Mother   . Other Mother        meningioma developed ~2019  . Glaucoma Mother   . Cancer Paternal Grandfather   . Heart attack Paternal Grandfather   . Stroke Paternal Uncle        age 55 . Polycythemia Paternal Uncle   . Stroke Maternal Grandmother   . Congestive Heart Failure Maternal Grandmother   . Heart attack Maternal Grandmother   . Protein C deficiency Sister 344      Miscarriages  . Breast cancer Maternal Aunt 327  Social History   Socioeconomic History  . Marital status: Married    Spouse name: Not on file  . Number of children: 2  . Years of education: 4y college  . Highest education level: Not on file  Occupational History  . Occupation: Pediatric Nurse practitioner    Comment: [place] working [place]  Tobacco Use  . Smoking status: Never Smoker  . Smokeless tobacco: Never Used  Vaping Use  . Vaping Use: Never used  Substance and Sexual Activity  . Alcohol use: Yes    Comment: social  . Drug use: No  . Sexual activity: Yes    Birth control/protection: None    Comment: patient is a transgender on  testosterone shots, no biological kids  Other Topics Concern  . Not on file  Social History Narrative   Education 4 year college, former Therapist, sports X 15 years, pediatric nurse practitioner x 6 years, did NP degree from [employer]. Relocated to [place] about 2 months ago from [place], [place]. Patient was in [place] for last 4 years and prior to that in [place]. His wife is working as Scientist, research (physical sciences) for [employer]. Patient is not working and applying for disability. They have 2 kids but no biologic children.    Social Determinants of Health   Financial Resource Strain: Not on file  Food Insecurity: Not on file  Transportation Needs: Not on file  Physical Activity: Not on file  Stress: Not on file   Social Connections: Not on file   Past Surgical History:  Procedure Laterality Date  . ABDOMINAL HYSTERECTOMY Bilateral 1994   TAH, BSO- tranverse incision at 55 yo  . ANKLE ARTHROSCOPY WITH RECONSTRUCTION Right 2007  . CHOLECYSTECTOMY     laparoscopic  . COLONOSCOPY     x3  . EYE SURGERY     Left eye 03/02/2018, right 02/15/2018  . HIP ARTHROSCOPY W/ LABRAL REPAIR Right 05/11/2013   acetabular labral tear 03/30/2013  . KNEE ARTHROPLASTY Right   . KNEE JOINT MANIPULATION Left    x3 under anesthesia  . KNEE SURGERY Bilateral 1984   Right ACL, left PCL repair  . LITHOTRIPSY  2005  . LIVER BIOPSY  2013   normal results.  [name] MASTECTOMY Bilateral    prior to 2009  . MOUTH SURGERY    . NASAL SEPTUM SURGERY N/A 09/20/2015   by ENT Dr. [name]  . OVARIAN CYST SURGERY Left    size of grapefruit, was informed that she had shortened vagina  . SHOULDER SURGERY Bilateral    Right 08/15/2016, Left 11/15/2016  . THUMB ARTHROSCOPY Left   . THYROIDECTOMY, PARTIAL Left 2008  . TOTAL KNEE ARTHROPLASTY Right 08/23/2018   Procedure: TOTAL KNEE ARTHROPLASTY;  Surgeon: [name], MD;  [facility];  Service: Orthopedics;  Laterality: Right;  37mn  . TOTAL KNEE REVISION Left 02/06/2016   Procedure: LEFT TOTAL KNEE REVISION;  Surgeon: FGaynelle Arabian MD;  [facility];  Service: Orthopedics;  Laterality: Left;  . TOTAL KNEE REVISION Left 04/22/2017   Procedure: Left knee polyethylene revision;  Surgeon: AGaynelle Arabian MD;  [facility];  Service: Orthopedics;  Laterality: Left;  . UPPER GI ENDOSCOPY  2003   Past Medical History:  Diagnosis Date  . Abnormal weight loss   . Anxiety   . Arthritis   . Cataract    OU  . Celiac disease   . Cervical neck pain with evidence of disc disease    patient has a cyst   . Chronic constipation   . Chronic diastolic heart failure ([place]    Pt. denies  . Chronic pain   . Degenerative disc disease at L5-S1 level    with stenosis  . DVT (deep  venous thrombosis) (HCC)    Right upper arm, bilateral leg  . Eczema    inguinal, feet  . Elevated liver enzymes   . Failed total knee arthroplasty ([place] 04/22/2017  . Family history of adverse reaction to anesthesia    family has problems with anesthesia of nausea and vomiting   . 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 GERD (gastroesophageal reflux disease)    History of in 20's  . Gluten enteropathy   . H/O  parotitis    right   . Hard of hearing   . History of kidney stones   . History of retinal tear    Bilateral  . History of staph infection    required wound vac  . Hx-TIA (transient ischemic attack)    2015  . LVH (left ventricular hypertrophy) 12/15/2016   Mild, noted on ECHO  . MVP (mitral valve prolapse)   . NAFL (nonalcoholic fatty liver)   . Neck pain   . Neuromuscular disorder ([place])    bilateral neuropathy feet.  . Nuclear sclerotic cataract of both eyes 09/08/2019  . Pneumonia 12/17/2010  . Polycythemia   . Polycythemia, secondary   . PONV (postoperative nausea and vomiting)   . Protein C deficiency ([place])    Dr. Anne Fu  . Psoriasis    16 X10 cm psoriatic rash on sole of left foot ; open and occ scant bleeding;   . psoriatic arthritis   . PTSD (post-traumatic stress disorder)   . Scaphoid fracture of wrist 09/23/2013  . Seizure ([place])    childhood, medication until age 15 then weanned completely off  . Sleep apnea    split night study last done by Dr. [name] 06/18/15 shows severe OSA, CSA, and hypersomnia, rec bipap  . Splenomegaly   . Stenosis of ureteropelvic junction (UPJ)    left  . 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 [name] follows  . 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 Tachycardia    hx of   . Transfusion history    past history- none recent, after surgeries due to blood loss  . Wears glasses   . Wears hearing aid    BP 121/85   Pulse 96   Temp 98.6 F (37 C)   SpO2 92%    Opioid Risk Score:   Fall Risk Score:  `1  Depression screen PHQ 2/9  Depression screen PHQ 2/9 03/21/2020  Decreased Interest 0  Down, Depressed, Hopeless 0  PHQ - 2 Score 0  Some recent data might be hidden    Review of Systems     Objective:   Physical Exam   General: No acute distress HEENT: EOMI, oral membranes moist Cards: reg rate  Chest: normal effort Abdomen: Soft, NT, ND Skin: dry, intact Extremities: no edema Psych: pleasant and appropriate   Neuro: Pt is pleasant and alert and oriented x 3.   Strength is grossly 3-4/5 based on pain, splint.   reflexies are hyperactive. patchy sensory loss in legs..   Musculoskeletal:  right thumb splinted. Low back tender. + SST bilaterally. DTRs trace.     Assessment & Plan:   1. Psoriatic arthritis with pain in multiple areas, most prominently feet, hands, elbows. His pain is also related to his prior CVA and associated motor/sensory change   2. 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  3. Hx of left total knee revision again on 04/21/17 per [facility] and right TKA 08/2018 4. Chronic  low back pain---MRI with severe DDD at L5-S1. Left S1 radiculopathy? S/p LEFT L5-S1 ESI  -6/1 now with increased radicular sx in bilateral legs, sx slowly worsening over the last year 5. Protein C deficiency   6. Left shoulder subluxation, bicipital tendonitis  7. Central sleep apnea  8.  Polycythemia  9. Depression with anxiety.  10. Left lateral epidondylitis  11. 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  left sided weakness can fluctuate. Most recent cervical MRI (07/08/17) without significant change 12. 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 [place] 14. Right wrist psoriatic arthritis, tendonapthy, ganglion cysts s/p multiple recent surgeries.      Plan:  1. Continue effexor 324m xr daily  2.  right thumb jt replacement surgery by ortho next month. 3. Voltaren gel to hands, feet, knees, elbows--- continue 4. Continue baclofen for lower extermity spasms/back pain. 20 mg 4 times daily #120.  5. Since MTX has been increased will reduce  kadian from 658mto 501m12 #60 and oxycodone 65m43m0 back to baseline We will continue the controlled substance monitoring program, this consists of regular clinic visits, examinations, routine drug screening, pill counts as well [place] use [place].   -oxycodone back to baseline rx  -drug swab today     6.   forearm compressive ban, ice in short term to help support extensor tendon          7. Has had good results previously with left L5-S1 translaminar injection per Dr. KirsLetta Pateven new leg sx will re-check MRI of lumbar spine.   -consider f/u ESI 8: OI constipation             -amitiza

## 2020-08-01 NOTE — Patient Instructions (Signed)
PLEASE FEEL FREE TO CALL OUR OFFICE WITH ANY PROBLEMS OR QUESTIONS ([phone])      

## 2020-08-02 ENCOUNTER — Encounter (INDEPENDENT_AMBULATORY_CARE_PROVIDER_SITE_OTHER): Payer: Self-pay | Admitting: Otolaryngology

## 2020-08-02 ENCOUNTER — Ambulatory Visit (INDEPENDENT_AMBULATORY_CARE_PROVIDER_SITE_OTHER): Payer: BC Managed Care – PPO | Admitting: Otolaryngology

## 2020-08-02 VITALS — Temp 97.7°F

## 2020-08-02 DIAGNOSIS — H6121 Impacted cerumen, right ear: Secondary | ICD-10-CM | POA: Diagnosis not present

## 2020-08-02 DIAGNOSIS — H60312 Diffuse otitis externa, left ear: Secondary | ICD-10-CM | POA: Diagnosis not present

## 2020-08-02 NOTE — Progress Notes (Signed)
HPI: Jesse Bowers is a 55 y.o. adult who returns today for evaluation of ear infection which is much worse on the left side.  He stated that he got some mosquito's in his ears and washed the mosquitoes out of his ears.  He subsequently developed a left severe ear infection with swelling swollen lymph nodes in the left neck.  It was getting worse.  He started Cipro eardrops 4 days ago and it is doing better today.  He still has blockage of the left ear as well as blockage of the right ear.  The pain is doing better in the left ear but still is painful..  Past Medical History:  Diagnosis Date  . Abnormal weight loss   . Anxiety   . Arthritis   . Cataract    OU  . Celiac disease   . Cervical neck pain with evidence of disc disease    patient has a cyst   . Chronic constipation   . Chronic diastolic heart failure ([place])    Pt. denies  . Chronic pain   . Degenerative disc disease at L5-S1 level    with stenosis  . DVT (deep venous thrombosis) (H[facility])    Right upper arm, bilateral leg  . Eczema    inguinal, feet  . Elevated liver enzymes   . 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 Family history of adverse reaction to anesthesia    family has problems with anesthesia of nausea and vomiting   . 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 GERD (gastroesophageal reflux disease)    History of in 20's  . Gluten enteropathy   . H/O parotitis    right   . Hard of hearing   . History of kidney stones   . History of retinal tear    Bilateral  . History of staph infection    required wound vac  . Hx-TIA (transient ischemic attack)    2015  . LVH (left ventricular hypertrophy) 12/15/2016   Mild, noted on ECHO  . MVP (mitral valve prolapse)   . NAFL (nonalcoholic fatty liver)   . Neck pain   . Neuromuscular disorder ([place])    bilateral neuropathy feet.  . Nuclear sclerotic cataract of both eyes 09/08/2019  . Pneumonia 12/17/2010  . Polycythemia   . Polycythemia, secondary   . PONV  (postoperative nausea and vomiting)   . Protein C deficiency ([place])    Dr. Anne Fu  . Psoriasis    16 X10 cm psoriatic rash on sole of left foot ; open and occ scant bleeding;   . psoriatic arthritis   . PTSD (post-traumatic stress disorder)   . Scaphoid fracture of wrist 09/23/2013  . Seizure ([place])    childhood, medication until age 78 then weanned completely off  . Sleep apnea    split night study last done by Dr. [name] 06/18/15 shows severe OSA, CSA, and hypersomnia, rec bipap  . Splenomegaly   . Stenosis of ureteropelvic junction (UPJ)    left  . 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 [name] follows  . Syrinx of spinal cord ([place]) 01/06/2014   c spine on MRI  . Tachycardia    hx of   . Transfusion history    past history- none recent, after surgeries due to blood loss  . Wears glasses   . Wears hearing aid    Past Surgical History:  Procedure Laterality Date  . ABDOMINAL  HYSTERECTOMY Bilateral 1994   TAH, BSO- tranverse incision at 55 yo  . ANKLE ARTHROSCOPY WITH RECONSTRUCTION Right 2007  . CHOLECYSTECTOMY     laparoscopic  . COLONOSCOPY     x3  . EYE SURGERY     Left eye 03/02/2018, right 02/15/2018  . HIP ARTHROSCOPY W/ LABRAL REPAIR Right 05/11/2013   acetabular labral tear 03/30/2013  . KNEE ARTHROPLASTY Right   . KNEE JOINT MANIPULATION Left    x3 under anesthesia  . KNEE SURGERY Bilateral 1984   Right ACL, left PCL repair  . LITHOTRIPSY  2005  . LIVER BIOPSY  2013   normal results.  [name] MASTECTOMY Bilateral    prior to 2009  . MOUTH SURGERY    . NASAL SEPTUM SURGERY N/A 09/20/2015   by ENT Dr. [name]  . OVARIAN CYST SURGERY Left    size of grapefruit, was informed that she had shortened vagina  . SHOULDER SURGERY Bilateral    Right 08/15/2016, Left 11/15/2016  . THUMB ARTHROSCOPY Left   . THYROIDECTOMY, PARTIAL Left 2008  . TOTAL KNEE ARTHROPLASTY Right 08/23/2018   Procedure: TOTAL KNEE ARTHROPLASTY;   Surgeon: [name], MD;  Location: [facility];  Service: Orthopedics;  Laterality: Right;  42mn  . TOTAL KNEE REVISION Left 02/06/2016   Procedure: LEFT TOTAL KNEE REVISION;  Surgeon: FGaynelle Arabian MD;  Location: [facility];  Service: Orthopedics;  Laterality: Left;  . TOTAL KNEE REVISION Left 04/22/2017   Procedure: Left knee polyethylene revision;  Surgeon: AGaynelle Arabian MD;  Location: [facility];  Service: Orthopedics;  Laterality: Left;  . UPPER GI ENDOSCOPY  2003   Social History   Socioeconomic History  . Marital status: Married    Spouse name: Not on file  . Number of children: 2  . Years of education: 4y college  . Highest education level: Not on file  Occupational History  . Occupation: Pediatric Nurse practitioner    Comment: Not working since [place]  Tobacco Use  . Smoking status: Never Smoker  . Smokeless tobacco: Never Used  Vaping Use  . Vaping Use: Never used  Substance and Sexual Activity  . Alcohol use: Yes    Comment: social  . Drug use: No  . Sexual activity: Yes    Birth control/protection: None    Comment: patient is a transgender on testosterone shots, no biological kids  Other Topics Concern  . 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 [place] in [place] His wife is working as HScientist, research (physical sciences)for [employer] Patient is not working and applying for disability. They have 2 kids but no biologic children.    Social Determinants of Health   Financial Resource Strain: Not on file  Food Insecurity: Not on file  Transportation Needs: Not on file  Physical Activity: Not on file  Stress: Not on file  Social Connections: Not on file   Family History  Problem Relation Age of Onset  . Stroke Maternal Grandfather        512 .  Heart attack Maternal Grandfather   . Glaucoma Maternal Grandfather   . Macular degeneration Maternal Grandfather   . Breast cancer Sister   . Hypertension Mother   . Psoriasis Mother   .  Other Mother        meningioma developed ~2019  . Glaucoma Mother   . Cancer Paternal Grandfather   . Heart attack Paternal Grandfather   . Stroke Paternal Uncle        age 6  . Polycythemia Paternal Uncle   . Stroke Maternal Grandmother   . Congestive Heart Failure Maternal Grandmother   . Heart attack Maternal Grandmother   . Protein C deficiency Sister 51       Miscarriages  . Breast cancer Maternal Aunt 35   Allergies  Allergen Reactions  . Penicillin G Anaphylaxis and Other (See Comments)    Has patient had a PCN reaction causing immediate rash, facial/tongue/throat swelling, SOB or lightheadedness with hypotension: Yes Has patient had a PCN reaction causing severe rash involving mucus membranes or skin necrosis: No Has patient had a PCN reaction that required hospitalization Yes Has patient had a PCN reaction occurring within the last 10 years: No If all of the above answers are "NO", then may proceed with Cephalosporin use.   . Sulfa Antibiotics Rash    Stevens-Johnson rash  . Tegaderm Ag Mesh [Silver] Dermatitis    Causes blistering wounds   . Ibuprofen Other (See Comments)    Contraindicated with Xarelto.   . Pregabalin Other (See Comments)    Ineffective  . Sulfacetamide Sodium-Sulfur Rash   Prior to Admission medications   Medication Sig Start Date End Date Taking? Authorizing Provider  acyclovir ointment (ZOVIRAX) 5 % as needed. 04/11/20   [provider]  baclofen (LIORESAL) 20 MG tablet TAKE 1 TABLET BY MOUTH 4 TIMES DAILY. 06/13/20   Meredith Staggers, MD  Calcipotriene-Betameth Diprop 0.005-0.064 % FOAM Apply 1 application topically 2 (two) times daily as needed.     [provider]  clorazepate (TRANXENE) 15 MG tablet Take 15 mg by mouth at bedtime.     [provider]  clorazepate (TRANXENE) 7.5 MG tablet Take 7.5 mg by mouth 2 (two) times daily.    [provider]  COSENTYX SENSOREADY, 300 MG, 150 MG/ML SOAJ Inject 2 Syringes into the skin every 28 (twenty-eight) days. 03/16/20   [provider]  Cyanocobalamin (VITAMIN B-12) 5000 MCG SUBL Place 5,000 mcg under the tongue daily. 05/01/20   [provider]  desonide (DESOWEN) 0.05 % ointment Apply 1 application topically 2 (two) times daily as needed (psoriasis).  08/04/15   [provider]  ergocalciferol (VITAMIN D2) 1.25 MG (50000 UT) capsule Take 1 capsule by mouth once a week. 02/09/20   [provider]  finasteride (PROPECIA) 1 MG tablet Take 1 mg by mouth daily.    [provider]  Fluocinolone Acetonide 0.01 % OIL Place 3 drops into both ears 2 (two) times daily as needed for itching. 06/09/18   [provider]  folic acid (FOLVITE) 1 MG tablet Take 1 mg by mouth daily. 11/21/19   [provider]  lubiprostone (AMITIZA) 8 MCG capsule Take 1 capsule (8 mcg total) by mouth 2 (two) times daily with a meal. 07/13/20   Meredith Staggers, MD  morphine (KADIAN) 50 MG 24 hr capsule Take 1 capsule (50 mg total) by mouth daily. 08/01/20   Meredith Staggers, MD  neomycin-polymyxin-hydrocortisone (CORTISPORIN) 3.5-10000-1 OTIC suspension Place 4 drops into both ears 2 (two) times daily as needed (ear pain).  03/31/17   [provider]  Oxycodone HCl 10 MG TABS Take 1 tablet (10 mg total) by mouth every 4 (four) hours  as needed. 07/04/20   Meredith Staggers, MD  potassium chloride (KLOR-CON) 10 MEQ tablet TAKE 2 TABLETS BY MOUTH EVERY DAY 12/15/19   Troy Sine, MD  rivaroxaban (XARELTO) 20 MG TABS tablet TAKE 1 TABLET(20 MG) BY MOUTH EVERY NIGHT AT BEDTIME 03/21/20   [name], MD  testosterone cypionate (DEPOTESTOSTERONE CYPIONATE) 200 MG/ML injection INJECT 0.5 MLS (100 MG TOTAL) INTO THE MUSCLE ONCE A WEEK. 03/15/18   Shawnee Knapp, MD  ursodiol (ACTIGALL) 250 MG tablet Take 250 mg by mouth 2 (two) times daily with a meal.     [provider]  ursodiol (ACTIGALL) 500 MG tablet Take 1 tablet midday with lunch and 1 tablet in the evening with dinneer 05/23/20   Leonie Man, MD  venlafaxine XR (EFFEXOR-XR) 150 MG 24 hr capsule Take 2 capsules (300 mg total) by mouth daily with breakfast. 10/14/17   Meredith Staggers, MD     Positive ROS: Otherwise negative  All other systems have been reviewed and were otherwise negative with the exception of those mentioned in the HPI and as above.  Physical Exam: Constitutional: Alert, well-appearing, no acute distress Ears: External ears without lesions or tenderness.  Right ear canal is small and minimal swelling.  He had wax adjacent to the right TM that was obstructing the right TM that was cleaned with suction.  After cleaning the right ear canal the right TM was clear with good mobility on pneumatic otoscopy.  The left ear canal was completely swollen shut consistent with acute left external otitis.  I cannot visualize the TM.  I cleaned the ear canal as best as possible with the 5 and 3 suction and placed an ear wick in the left ear and Floxin eardrops. Nasal: External nose without lesions.. Clear nasal passages Oral: Lips and gums without lesions. Tongue and palate mucosa without lesions. Posterior oropharynx clear. Neck: No palpable adenopathy or masses Respiratory: Breathing comfortably  Skin: No facial/neck lesions or rash noted.  Cerumen impaction removal  Date/Time: 08/02/2020 5:31 PM Performed by: Rozetta Nunnery, MD Authorized by: Rozetta Nunnery, MD   Consent:    Consent obtained:  Verbal   Consent given by:  Patient   Risks discussed:  Pain and bleeding Procedure details:    Location:  L ear and R ear   Procedure type: forceps   Post-procedure details:    Inspection:  TM intact and canal normal   Hearing quality:  Improved    Patient tolerance of procedure:  Tolerated well, no immediate complications Comments:     Right ear canal was obstructed with cerumen that was adjacent to the TM that was removed with suction and hydroperoxide irrigation.  The left TM was completely swollen shut consistent with acute left external otitis.  I placed an ear wick and antibiotic eardrops in the left ear.    Assessment: Acute left otitis externa. Wax buildup in the right ear canal.  Plan: Right ear canal was cleaned in the office today no real evidence of active infection. An ear wick was placed in the left ear as the left ear canal was completely swollen shut.  Floxin drops were placed.  Patient will continue with the Ciprodex eardrops twice a day we will put drops in tonight. He will follow-up on Monday to have the ear wick removed and the left ear canal cleaned   Radene Journey, MD

## 2020-08-06 ENCOUNTER — Ambulatory Visit (INDEPENDENT_AMBULATORY_CARE_PROVIDER_SITE_OTHER): Payer: BC Managed Care – PPO | Admitting: Otolaryngology

## 2020-08-06 ENCOUNTER — Telehealth: Payer: Self-pay | Admitting: Physical Medicine & Rehabilitation

## 2020-08-06 ENCOUNTER — Encounter
Admission: RE | Admit: 2020-08-06 | Discharge: 2020-08-06 | Disposition: A | Discharge status: Home / Self Care | Payer: BC Managed Care – PPO | Source: Ambulatory Visit | Attending: Urology | Admitting: Urology

## 2020-08-06 ENCOUNTER — Other Ambulatory Visit: Payer: Self-pay

## 2020-08-06 VITALS — Temp 97.0°F

## 2020-08-06 DIAGNOSIS — H60313 Diffuse otitis externa, bilateral: Secondary | ICD-10-CM | POA: Diagnosis not present

## 2020-08-06 DIAGNOSIS — Q6239 Other obstructive defects of renal pelvis and ureter: Secondary | ICD-10-CM | POA: Insufficient documentation

## 2020-08-06 DIAGNOSIS — H6122 Impacted cerumen, left ear: Secondary | ICD-10-CM

## 2020-08-06 LAB — BLADDER SCAN AMB NON-IMAGING: Scan Result: 73

## 2020-08-06 MED ORDER — FUROSEMIDE 10 MG/ML IJ SOLN
50.0000 mg | Freq: Once | INTRAMUSCULAR | Status: AC
  Administered 2020-08-06: 50 mg via INTRAVENOUS
  Filled 2020-08-06: qty 5

## 2020-08-06 MED ORDER — TECHNETIUM TC 99M MERTIATIDE
5.0000 | Freq: Once | INTRAVENOUS | Status: AC | PRN
  Administered 2020-08-06: 4.99 via INTRAVENOUS

## 2020-08-06 MED ORDER — MORPHINE SULFATE ER 60 MG PO CP24: 60.0000 mg | ORAL_CAPSULE | Freq: Two times a day (BID) | ORAL | 0 refills | Status: DC

## 2020-08-06 MED ORDER — MORPHINE SULFATE ER 20 MG PO CP24: 20.0000 mg | ORAL_CAPSULE | Freq: Two times a day (BID) | ORAL | 0 refills | Status: DC

## 2020-08-06 MED ORDER — MORPHINE SULFATE ER 30 MG PO CP24: 30.0000 mg | ORAL_CAPSULE | Freq: Two times a day (BID) | ORAL | 0 refills | Status: DC

## 2020-08-06 NOTE — Telephone Encounter (Signed)
I've already changed and addressed in a patient advise encounter

## 2020-08-06 NOTE — Addendum Note (Signed)
Addended by: Alger Simons T on: 08/06/2020 02:06 PM   Modules accepted: Orders

## 2020-08-06 NOTE — Telephone Encounter (Signed)
23m ordered.  He should just stay on this until he's do for next rf.  We'll make the change then

## 2020-08-06 NOTE — Telephone Encounter (Signed)
Jesse Bowers has no morphine (kadian) and they will have to order the 30 mg + 20 mg Kadian.  He is asking for you to order the 60 mg that they have waiting for him (he is the only one taking) and then the pharmacy will order the lower dose and he can bring the remainder of the 60 in to be destroyed at his next visit, but for now he has no morphine and he cannot wait 2 days for them to get the medication in without going into withdrawal.

## 2020-08-06 NOTE — Telephone Encounter (Signed)
Notified by VM. Encouraged to call at least a week before he is going to be out.

## 2020-08-06 NOTE — Progress Notes (Signed)
HPI: Jesse Bowers is a 55 y.o. adult who returns today for evaluation of left external otitis.  He was seen last week because of severe pain in the left ear and swelling of the ear canal making visualization of the ear canal and TM and possible.  An ear wick was placed in the left ear canal last week but apparently this fell out within a day.  He has been using the antibiotic eardrops.  Now he has noticed some drainage from the right ear. He wears bilateral hearing aids and has worse hearing in the left ear..  Past Medical History:  Diagnosis Date  . Abnormal weight loss   . Anxiety   . Arthritis   . Cataract    OU  . Celiac disease   . Cervical neck pain with evidence of disc disease    patient has a cyst   . Chronic constipation   . Chronic diastolic heart failure ([place])    Pt. denies  . Chronic pain   . Degenerative disc disease at L5-S1 level    with stenosis  . DVT (deep venous thrombosis) (HCC)    Right upper arm, bilateral leg  . Eczema    inguinal, feet  . Elevated liver enzymes   . 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 Family history of adverse reaction to anesthesia    family has problems with anesthesia of nausea and vomiting   . 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 GERD (gastroesophageal reflux disease)    History of in 20's  . Gluten enteropathy   . H/O parotitis    right   . Hard of hearing   . History of kidney stones   . History of retinal tear    Bilateral  . History of staph infection    required wound vac  . Hx-TIA (transient ischemic attack)    2015  . LVH (left ventricular hypertrophy) 12/15/2016   Mild, noted on ECHO  . MVP (mitral valve prolapse)   . NAFL (nonalcoholic fatty liver)   . Neck pain   . Neuromuscular disorder ([place])    bilateral neuropathy feet.  . Nuclear sclerotic cataract of both eyes 09/08/2019  . Pneumonia 12/17/2010  . Polycythemia   . Polycythemia, secondary   . PONV (postoperative nausea and vomiting)   .  Protein C deficiency ([place])    Dr. Anne Fu  . Psoriasis    16 X10 cm psoriatic rash on sole of left foot ; open and occ scant bleeding;   . psoriatic arthritis   . PTSD (post-traumatic stress disorder)   . Scaphoid fracture of wrist 09/23/2013  . Seizure ([place])    childhood, medication until age 42 then weanned completely off  . Sleep apnea    split night study last done by Dr. [name] 06/18/15 shows severe OSA, CSA, and hypersomnia, rec bipap  . Splenomegaly   . Stenosis of ureteropelvic junction (UPJ)    left  . Stroke [facility])    CVA vs TIA in left cerebrum causing slight right sided weakness-Dr. [name] follows  . Syrinx of spinal cord ([place]) 01/06/2014   c spine on MRI  . Tachycardia    hx of   . Transfusion history    past history- none recent, after surgeries due to blood loss  . Wears glasses   . Wears hearing aid    Past Surgical History:  Procedure Laterality Date  . ABDOMINAL HYSTERECTOMY Bilateral 1994   TAH, BSO- tranverse incision at 55  yo  . ANKLE ARTHROSCOPY WITH RECONSTRUCTION Right 2007  . CHOLECYSTECTOMY     laparoscopic  . COLONOSCOPY     x3  . EYE SURGERY     Left eye 03/02/2018, right 02/15/2018  . HIP ARTHROSCOPY W/ LABRAL REPAIR Right 05/11/2013   acetabular labral tear 03/30/2013  . KNEE ARTHROPLASTY Right   . KNEE JOINT MANIPULATION Left    x3 under anesthesia  . KNEE SURGERY Bilateral 1984   Right ACL, left PCL repair  . LITHOTRIPSY  2005  . LIVER BIOPSY  2013   normal results.  [name] MASTECTOMY Bilateral    prior to 2009  . MOUTH SURGERY    . NASAL SEPTUM SURGERY N/A 09/20/2015   by ENT Dr. [name]  . OVARIAN CYST SURGERY Left    size of grapefruit, was informed that she had shortened vagina  . SHOULDER SURGERY Bilateral    Right 08/15/2016, Left 11/15/2016  . THUMB ARTHROSCOPY Left   . THYROIDECTOMY, PARTIAL Left 2008  . TOTAL KNEE ARTHROPLASTY Right 08/23/2018   Procedure: TOTAL KNEE ARTHROPLASTY;  Surgeon: [name], MD;  Location: [facility];   Service: Orthopedics;  Laterality: Right;  78mn  . TOTAL KNEE REVISION Left 02/06/2016   Procedure: LEFT TOTAL KNEE REVISION;  Surgeon: FGaynelle Arabian MD;  Location: [facility];  Service: Orthopedics;  Laterality: Left;  . TOTAL KNEE REVISION Left 04/22/2017   Procedure: Left knee polyethylene revision;  Surgeon: AGaynelle Arabian MD;  Location: [facility];  Service: Orthopedics;  Laterality: Left;  . UPPER GI ENDOSCOPY  2003   Social History   Socioeconomic History  . Marital status: Married    Spouse name: Not on file  . Number of children: 2  . Years of education: 4y college  . Highest education level: Not on file  Occupational History  . Occupation: Pediatric Nurse practitioner    Comment: Not working since [place]2015  Tobacco Use  . Smoking status: Never Smoker  . Smokeless tobacco: Never Used  Vaping Use  . Vaping Use: Never used  Substance and Sexual Activity  . Alcohol use: Yes    Comment: social  . Drug use: No  . Sexual activity: Yes    Birth control/protection: None    Comment: patient is a transgender on testosterone shots, no biological kids  Other Topics Concern  . 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 [place] was in [place] 4 years [place] in [place] His wife is working as HScientist, research (physical sciences)for [employer] Patient is not working and applying for disability. They have 2 kids but no biologic children.    Social Determinants of Health   Financial Resource Strain: Not on file  Food Insecurity: Not on file  Transportation Needs: Not on file  Physical Activity: Not on file  Stress: Not on file  Social Connections: Not on file   Family History  Problem Relation Age of Onset  . Stroke Maternal Grandfather        555 . Heart attack Maternal Grandfather   . Glaucoma  Maternal Grandfather   . Macular degeneration Maternal Grandfather   . Breast cancer Sister   . Hypertension Mother   . Psoriasis Mother   . Other Mother        meningioma developed ~  2019  . Glaucoma Mother   . Cancer Paternal Grandfather   . Heart attack Paternal Grandfather   . Stroke Paternal Uncle        age 102  . Polycythemia Paternal Uncle   . Stroke Maternal Grandmother   . Congestive Heart Failure Maternal Grandmother   . Heart attack Maternal Grandmother   . Protein C deficiency Sister 22       Miscarriages  . Breast cancer Maternal Aunt 35   Allergies  Allergen Reactions  . Penicillin G Anaphylaxis and Other (See Comments)    Has patient had a PCN reaction causing immediate rash, facial/tongue/throat swelling, SOB or lightheadedness with hypotension: Yes Has patient had a PCN reaction causing severe rash involving mucus membranes or skin necrosis: No Has patient had a PCN reaction that required hospitalization Yes Has patient had a PCN reaction occurring within the last 10 years: No If all of the above answers are "NO", then may proceed with Cephalosporin use.   . Sulfa Antibiotics Rash    Stevens-Johnson rash  . Tegaderm Ag Mesh [Silver] Dermatitis    Causes blistering wounds   . Ibuprofen Other (See Comments)    Contraindicated with Xarelto.   . Pregabalin Other (See Comments)    Ineffective  . Sulfacetamide Sodium-Sulfur Rash   Prior to Admission medications   Medication Sig Start Date End Date Taking? Authorizing Provider  acyclovir ointment (ZOVIRAX) 5 % as needed. 04/11/20   [provider]  baclofen (LIORESAL) 20 MG tablet TAKE 1 TABLET BY MOUTH 4 TIMES DAILY. 06/13/20   Meredith Staggers, MD  Calcipotriene-Betameth Diprop 0.005-0.064 % FOAM Apply 1 application topically 2 (two) times daily as needed.     [provider]  clorazepate (TRANXENE) 15 MG tablet Take 15 mg by mouth at bedtime.    [provider]  clorazepate (TRANXENE)  7.5 MG tablet Take 7.5 mg by mouth 2 (two) times daily.    [provider]  COSENTYX SENSOREADY, 300 MG, 150 MG/ML SOAJ Inject 2 Syringes into the skin every 28 (twenty-eight) days. 03/16/20   [provider]  Cyanocobalamin (VITAMIN B-12) 5000 MCG SUBL Place 5,000 mcg under the tongue daily. 05/01/20   [provider]  desonide (DESOWEN) 0.05 % ointment Apply 1 application topically 2 (two) times daily as needed (psoriasis).  08/04/15   [provider]  ergocalciferol (VITAMIN D2) 1.25 MG (50000 UT) capsule Take 1 capsule by mouth once a week. 02/09/20   [provider]  finasteride (PROPECIA) 1 MG tablet Take 1 mg by mouth daily.    [provider]  Fluocinolone Acetonide 0.01 % OIL Place 3 drops into both ears 2 (two) times daily as needed for itching. 06/09/18   [provider]  folic acid (FOLVITE) 1 MG tablet Take 1 mg by mouth daily. 11/21/19   [provider]  lubiprostone (AMITIZA) 8 MCG capsule Take 1 capsule (8 mcg total) by mouth 2 (two) times daily with a meal. 07/13/20   Meredith Staggers, MD  morphine (KADIAN) 60 MG 24 hr capsule Take 1 capsule (60 mg total) by mouth 2 (two) times daily. 08/06/20   Meredith Staggers, MD  neomycin-polymyxin-hydrocortisone (CORTISPORIN) 3.5-10000-1 OTIC suspension Place 4 drops into both ears 2 (two) times daily as needed (ear pain).  03/31/17   [provider]  Oxycodone HCl 10 MG TABS Take 1 tablet (10 mg total) by mouth every 4 (four) hours as needed. 07/04/20   Meredith Staggers,  MD  potassium chloride (KLOR-CON) 10 MEQ tablet TAKE 2 TABLETS BY MOUTH EVERY DAY 12/15/19   Troy Sine, MD  rivaroxaban (XARELTO) 20 MG TABS tablet TAKE 1 TABLET(20 MG) BY MOUTH EVERY NIGHT AT BEDTIME 03/21/20   [name], MD  testosterone cypionate (DEPOTESTOSTERONE CYPIONATE) 200 MG/ML injection INJECT 0.5 MLS (100 MG TOTAL) INTO THE MUSCLE ONCE A WEEK. 03/15/18   Shawnee Knapp, MD  ursodiol (ACTIGALL) 250  MG tablet Take 250 mg by mouth 2 (two) times daily with a meal.     [provider]  ursodiol (ACTIGALL) 500 MG tablet Take 1 tablet midday with lunch and 1 tablet in the evening with dinneer 05/23/20   Leonie Man, MD  venlafaxine XR (EFFEXOR-XR) 150 MG 24 hr capsule Take 2 capsules (300 mg total) by mouth daily with breakfast. 10/14/17   Meredith Staggers, MD     Positive ROS: Otherwise negative  All other systems have been reviewed and were otherwise negative with the exception of those mentioned in the HPI and as above.  Physical Exam: Constitutional: Alert, well-appearing, no acute distress Ears: External ears without lesions or tenderness.  Left ear canal with much less swelling.  I am able to visualize wax adjacent to the left TM that was removed in the office using suction.  The ear canal actually looked much better and the TM was clear.  On the right side he had mild inflammatory changes of the lateral portion of the right ear canal that extended out to the concha..  The more medial ear canal and TM were clear.  Question whether this represents a fungal process from use of hearing aids or reaction to the hearing aids.  I applied CSF HC powder to both ear canals. Nasal: External nose without lesions.. Clear nasal passages Oral: Lips and gums without lesions. Tongue and palate mucosa without lesions. Posterior oropharynx clear. Neck: No palpable adenopathy or masses Respiratory: Breathing comfortably  Skin: No facial/neck lesions or rash noted.  Cerumen impaction removal  Date/Time: 08/06/2020 5:09 PM Performed by: Rozetta Nunnery, MD Authorized by: Rozetta Nunnery, MD   Consent:    Consent obtained:  Verbal   Consent given by:  Patient   Risks discussed:  Pain and bleeding Procedure details:    Location:  L ear   Procedure type: suction   Post-procedure details:    Inspection:  TM intact and canal normal   Hearing quality:  Improved   Patient tolerance  of procedure:  Tolerated well, no immediate complications Comments:     Patient with wax adjacent to the left TM that was cleaned with a suction.  Right ear canal has some mild inflammatory changes of the lateral portion of the ear canal.  I applied CSF HC powder to both ear canals.    Assessment: Chronic otitis externa. The left ear canal looks much better today and this ear canal was cleaned with suction.  Plan: I discussed with him concerning use of 1% clotrimazole cream with HC as this is effective in fungal infections. He can otherwise use the eardrops or alcohol vinegar ear rinses. He will follow-up as needed.   Radene Journey, MD

## 2020-08-06 NOTE — Telephone Encounter (Signed)
Per voicemail Sunday [phone]  at 12:25,  and also Sunday at 1144 pm --   He said [facility] morphine does not come in the reduced 50 mg version - you can change it to a 30 mg and a 20 mg -- but not made in the version you asked, or perhaps a difference brand.   [facility]  ([address]  is [facility] and they will give him the 22 as he is the only one who takes it - if necessary -- he has multiple appts Monday leave a message and please make RX change-- he will be out Monday AM -- he doesn't want to pick up 60 mg if he doesn't need it - but if change cannot take place prior to running out Wednesday he will have to get that refill and have it destroyed once smaller RX are received at that pharmacy  [phone] his number for questions

## 2020-08-07 ENCOUNTER — Other Ambulatory Visit: Payer: Self-pay | Admitting: Cardiovascular Disease

## 2020-08-07 ENCOUNTER — Telehealth: Payer: Self-pay | Admitting: Cardiovascular Disease

## 2020-08-07 NOTE — Telephone Encounter (Signed)
Let patient know that refills has been sent over to the pharmacy for both doses of Lasix.

## 2020-08-07 NOTE — Telephone Encounter (Signed)
*  STAT* If patient is at the pharmacy, call can be transferred to refill team.   1. Which medications need to be refilled? (please list name of each medication and dose if known)  Furosemide 2m  2. Which pharmacy/location (including street and city if local pharmacy) is medication to [facility] sent [id number] [address] [facility]. Do they need a 30 day or 90 day supply? 30 day supply

## 2020-08-08 ENCOUNTER — Encounter: Payer: Self-pay | Admitting: Hematology

## 2020-08-09 ENCOUNTER — Ambulatory Visit (INDEPENDENT_AMBULATORY_CARE_PROVIDER_SITE_OTHER): Payer: BC Managed Care – PPO | Admitting: Psychology

## 2020-08-09 ENCOUNTER — Telehealth: Payer: Self-pay | Admitting: Physical Medicine & Rehabilitation

## 2020-08-09 DIAGNOSIS — F4323 Adjustment disorder with mixed anxiety and depressed mood: Secondary | ICD-10-CM

## 2020-08-09 DIAGNOSIS — F4312 Post-traumatic stress disorder, chronic: Secondary | ICD-10-CM | POA: Diagnosis not present

## 2020-08-10 ENCOUNTER — Telehealth: Payer: Self-pay | Admitting: *Deleted

## 2020-08-10 ENCOUNTER — Other Ambulatory Visit: Payer: Self-pay

## 2020-08-10 NOTE — Telephone Encounter (Signed)
   [facility] Pre-operative Risk Assessment    Patient Name: Jesse Bowers  DOB: 02/26/1966  MRN: [id number]   Request for surgical clearance:  What type of surgery is being performed? Genital Gender Affirming Surgery   When is this surgery scheduled? TBD   What type of clearance is required (medical clearance vs. Pharmacy clearance to hold med vs. Both)? Both  Are there any medications that need to be held prior to surgery and how long? Xarelto  Practice name and name of physician performing surgery? [facility], Dr. Mayer Camel   What is the office phone number? [phone]   7.   What is the office fax number? [phone]  8.   Anesthesia type (None, local, MAC, general) ? Not listed   Jesse Bowers 08/10/2020, 4:34 PM  _________________________________________________________________   (provider comments below)

## 2020-08-10 NOTE — Telephone Encounter (Signed)
Jesse Bowers called because he has not heard from MRI yet, and he does not want to go to [place]. He would prefer to go to [facility]. I have let him know that the hold up on the MRI is the approval by insurance and we will switch locations.  He also was asking about his morphine sulfate ER.  He got the 60 mg and the 20 mg but he needs the 30 mg.  I called him back and told him he was not supposed to get the 20 mg yet and to hold it.  When due for refill on 60's (call about a week before) Dr [name] can send in the 30 mg to go with his 20 mg for his titrated down dosing of 50 mg bid.

## 2020-08-13 NOTE — Telephone Encounter (Addendum)
Patient with diagnosis of Protein C deficiency and prior stroke on Xarelto for anticoagulation.    Procedure: Genital Gender Affirming Surgery  Date of procedure: TBD  CrCl 87 ml/min Platelet count 167  Due to patients hx of stoke and protein c deficiency he is at high risk off his anticoagulation. I will defer to Dr. [name].

## 2020-08-14 ENCOUNTER — Telehealth: Payer: Self-pay | Admitting: Physical Medicine & Rehabilitation

## 2020-08-14 ENCOUNTER — Ambulatory Visit: Payer: BC Managed Care – PPO | Admitting: Urology

## 2020-08-14 NOTE — Telephone Encounter (Signed)
Per 2nd review from [facility] - they are again denying even though I called and challenged the clinical review of broken bones in the back -- 2nd denial is being placed on [facility] desk to advise next steps.

## 2020-08-14 NOTE — Telephone Encounter (Signed)
   Name: Jesse Bowers  DOB: 1965-03-15  MRN: [id number]  Primary Cardiologist: Sanda Klein, MD  Chart reviewed as part of pre-operative protocol coverage. Case reviewed with Dr. Debara Pickett who is covering for Dr. [name] who feel this patient needs to be seen by Dr. Loletha Grayer to discuss risk of procedure given hx.   Pre-op covering staff: - Please schedule appointment and call patient to inform them. If patient already had an upcoming appointment within acceptable timeframe, please add "pre-op clearance" to the appointment notes so provider is aware. - Please contact requesting surgeon's office via preferred method (i.e, phone, fax) to inform them of need for appointment prior to surgery.  If applicable, this message will also be routed to pharmacy pool and/or primary cardiologist for input on holding anticoagulant/antiplatelet agent as requested below so that this information is available to the clearing provider at time of patient's appointment.   Kathyrn Drown, NP  08/14/2020, 2:56 PM

## 2020-08-14 NOTE — Telephone Encounter (Signed)
This patient will probably need to be seen by Dr. Loletha Grayer for clearance and to discuss risk of being off  anticoagulation for surgery when he returns.  Dr. Lemmie Evens (covering for Dr. Loletha Grayer)

## 2020-08-14 NOTE — Telephone Encounter (Signed)
I will send a message to Dr. Victorino December nurse Lattie Haw to see if she can assist in helping find an appt for pre op appt . See note from Dr. Alain Honey who suggest pt probably should see Dr. Loletha Grayer.

## 2020-08-15 ENCOUNTER — Telehealth: Payer: Self-pay | Admitting: Physical Medicine & Rehabilitation

## 2020-08-15 NOTE — Telephone Encounter (Signed)
Received 2 more faxes from [facility] requesting same documentation - refaxed x 2 - called and advised [place] we cannot get MRI approved.

## 2020-08-16 ENCOUNTER — Telehealth: Payer: Self-pay

## 2020-08-16 ENCOUNTER — Encounter: Payer: Self-pay | Admitting: Hematology

## 2020-08-16 NOTE — Telephone Encounter (Signed)
Patient wants to get MRI done at [facility] and not [place].

## 2020-08-17 ENCOUNTER — Encounter: Payer: Self-pay | Admitting: Urology

## 2020-08-17 NOTE — Telephone Encounter (Signed)
Message sent to [facility] scheduling to set up pre op appt. See notes below.

## 2020-08-19 HISTORY — PX: HEMORROIDECTOMY: SUR656

## 2020-08-20 NOTE — Progress Notes (Signed)
08/21/2020 7:17 PM   Jesse Bowers 1965-10-16 [id number]  Referring provider: Shawnee Knapp, MD [address] [place],  [place] [address]  Urological history: 1. Left UPJ obstruction -creatinine 1.12 in 06/2020  -Renal lasix scan from 04/19/2019 normal right kidney with non obstructive hydronephrosis of the left kidney with normal clearance from the mildly dilated left renal pelvis following Lasix administration  2. OAB -contributing factors of age, stroke, sleep apnea and depression -PVR 73 mL   Chief Complaint  Patient presents with   Follow-up     HPI: Jesse Bowers is a 55 y.o. who presents today for renal lasix scan.    Renal lasix scan 08/2020 Dilated LEFT renal collecting system though there is adequate clearance of tracer following diuretic administration, indicating an absence of significant urinary outflow obstruction.  Normal RIGHT renogram.  He is having has been having a week of not feeling well, feeling hot, intense sweating and pain in the right flank radiating to the right groin with voiding.  He is also having issue with constipation.  He has an upcoming appointment for a colonoscopy on 09/05/2020.  He states the last time he felt like this, a ureteral stent was placed that relieved his pain.   He also had an episode of gross hematuria.    UA benign.   PMH: Past Medical History:  Diagnosis Date   Abnormal weight loss    Anxiety    Arthritis    Cataract    OU   Celiac disease    Cervical neck pain with evidence of disc disease    patient has a cyst    Chronic constipation    Chronic diastolic heart failure (HCC)    Pt. 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   Family history of adverse reaction to anesthesia    family has problems  with anesthesia of nausea and vomiting    Male-to-male transgender person    GERD (gastroesophageal reflux disease)    History of in 20's   Gluten enteropathy    H/O parotitis    right    Hard of hearing    History of kidney stones    History of retinal tear    Bilateral   History of staph infection    required wound vac   Hx-TIA (transient ischemic attack)    2015   LVH (left ventricular hypertrophy) 12/15/2016   Mild, noted on ECHO   MVP (mitral valve prolapse)    NAFL (nonalcoholic fatty liver)    Neck pain    Neuromuscular disorder ([place])    bilateral neuropathy feet.   Nuclear sclerotic cataract of both eyes 09/08/2019   Pneumonia 12/17/2010   Polycythemia    Polycythemia, secondary    PONV (postoperative nausea and vomiting)    Protein C deficiency (HCC)    Dr. Anne Fu   Psoriasis    16 X10 cm psoriatic rash on sole of left foot ; open and occ scant bleeding;    psoriatic arthritis    PTSD (post-traumatic stress disorder)    Scaphoid fracture of wrist 09/23/2013   Seizure ([place])    childhood, medication until age 16 then weanned completely off   Sleep  apnea    split night study last done by Dr. 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 [name] follows   Syrinx of spinal cord ([place]) 01/06/2014   c spine on MRI   Tachycardia    hx of    Transfusion history    past history- none recent, after surgeries due to blood loss   Wears glasses    Wears hearing aid     Surgical History: Past Surgical History:  Procedure Laterality Date   ABDOMINAL HYSTERECTOMY Bilateral 1994   TAH, BSO- tranverse incision at 55 yo   ANKLE ARTHROSCOPY WITH RECONSTRUCTION Right 2007   CHOLECYSTECTOMY     laparoscopic   COLONOSCOPY     x3   EYE SURGERY     Left eye 03/02/2018, right 02/15/2018   HIP  ARTHROSCOPY W/ LABRAL REPAIR Right 05/11/2013   acetabular labral tear 03/30/2013   KNEE ARTHROPLASTY Right    KNEE JOINT MANIPULATION Left    x3 under anesthesia   KNEE SURGERY Bilateral 1984   Right ACL, left PCL repair   LITHOTRIPSY  2005   LIVER BIOPSY  2013   normal results.   MASTECTOMY Bilateral    prior to 2009   MOUTH SURGERY     NASAL SEPTUM SURGERY N/A 09/20/2015   by ENT Dr. [name]   OVARIAN CYST SURGERY Left    size of grapefruit, was informed that she had shortened vagina   SHOULDER SURGERY Bilateral    Right 08/15/2016, Left 11/15/2016   THUMB ARTHROSCOPY Left    THYROIDECTOMY, PARTIAL Left 2008   TOTAL KNEE ARTHROPLASTY Right 08/23/2018   Procedure: TOTAL KNEE ARTHROPLASTY;  Surgeon: [name], MD;  Location: [facility];  Service: Orthopedics;  Laterality: Right;  9mn   TOTAL KNEE REVISION Left 02/06/2016   Procedure: LEFT TOTAL KNEE REVISION;  Surgeon: FGaynelle Arabian MD;  Location: [facility];  Service: Orthopedics;  Laterality: Left;   TOTAL KNEE REVISION Left 04/22/2017   Procedure: Left knee polyethylene revision;  Surgeon: AGaynelle Arabian MD;  Location: [facility];  Service: Orthopedics;  Laterality: Left;   UPPER GI ENDOSCOPY  2003    Home Medications:  Allergies as of 08/21/2020       Reactions   Penicillin G Anaphylaxis, Other (See Comments)   Has patient had a PCN reaction causing immediate rash, facial/tongue/throat swelling, SOB or lightheadedness with hypotension: Yes Has patient had a PCN reaction causing severe rash involving mucus membranes or skin necrosis: No Has patient had a PCN reaction that required hospitalization Yes Has patient had a PCN reaction occurring within the last 10 years: No If all of the above answers are "NO", then may proceed with Cephalosporin use.   Sulfa Antibiotics Rash   Stevens-Johnson rash   Tegaderm Ag Mesh [silver] Dermatitis   Causes blistering wounds   Ibuprofen Other (See Comments)   Contraindicated with Xarelto.     Pregabalin Other (See Comments)   Ineffective   Sulfacetamide Sodium-sulfur Rash        Medication List        Accurate as of August 21, 2020 11:59 PM. If you have any questions, ask your nurse or doctor.          acyclovir ointment 5 % Commonly known as: ZOVIRAX as needed.  baclofen 20 MG tablet Commonly known as: LIORESAL TAKE 1 TABLET BY MOUTH 4 TIMES DAILY.   Calcipotriene-Betameth Diprop 0.005-0.064 % Foam Apply 1 application topically 2 (two) times daily as needed.   clorazepate 7.5 MG tablet Commonly known as: TRANXENE Take 7.5 mg by mouth 2 (two) times daily.   clorazepate 15 MG tablet Commonly known as: TRANXENE Take 15 mg by mouth at bedtime.   Cosentyx Sensoready (300 MG) 150 MG/ML So[place] Generic drug: Secukinumab (300 MG Dose) Inject 2 Syringes into the skin every 28 (twenty-eight) days.   desonide 0.05 % ointment Commonly known as: DESOWEN Apply 1 application topically 2 (two) times daily as needed (psoriasis).   ergocalciferol 1.25 MG (50000 UT) capsule Commonly known as: VITAMIN D2 Take 1 capsule by mouth once a week.   eszopiclone 3 MG Tabs Generic drug: Eszopiclone Take 3 mg by mouth at bedtime. Take immediately before bedtime   finasteride 1 MG tablet Commonly known as: PROPECIA Take 1 mg by mouth daily.   Fluocinolone Acetonide 0.01 % Oil Place 3 drops into both ears 2 (two) times daily as needed for itching.   folic acid 1 MG tablet Commonly known as: FOLVITE Take 1 mg by mouth daily.   furosemide 20 MG tablet Commonly known as: LASIX TAKE 1 TABLET BY MOUTH DAILY. TO BE TAKEN WITH THE 40 MG TO EQUAL 60 MG DAILY   furosemide 40 MG tablet Commonly known as: LASIX TAKE 1 TABLET BY MOUTH DAILY. TO BE TAKEN WITH THE 20 MG TABLET TO EQUAL 60 MG DAILY.   lubiprostone 8 MCG capsule Commonly known as: Amitiza Take 1 capsule (8 mcg total) by mouth 2 (two) times daily with a meal.   morphine 60 MG 24 hr capsule Commonly known as:  Kadian Take 1 capsule (60 mg total) by mouth 2 (two) times daily.   neomycin-polymyxin-hydrocortisone 3.5-10000-1 OTIC suspension Commonly known as: CORTISPORIN Place 4 drops into both ears 2 (two) times daily as needed (ear pain).   Oxycodone HCl 10 MG Tabs Take 1 tablet (10 mg total) by mouth every 4 (four) hours as needed.   potassium chloride 10 MEQ tablet Commonly known as: KLOR-CON TAKE 2 TABLETS BY MOUTH EVERY DAY   rivaroxaban 20 MG Tabs tablet Commonly known as: Xarelto TAKE 1 TABLET(20 MG) BY MOUTH EVERY NIGHT AT BEDTIME   testosterone cypionate 200 MG/ML injection Commonly known as: DEPOTESTOSTERONE CYPIONATE INJECT 0.5 MLS (100 MG TOTAL) INTO THE MUSCLE ONCE A WEEK.   ursodiol 250 MG tablet Commonly known as: ACTIGALL Take 250 mg by mouth 2 (two) times daily with a meal.   ursodiol 500 MG tablet Commonly known as: ACTIGALL Take 1 tablet midday with lunch and 1 tablet in the evening with dinneer   venlafaxine XR 150 MG 24 hr capsule Commonly known as: EFFEXOR-XR Take 2 capsules (300 mg total) by mouth daily with breakfast.   Vitamin B-12 5000 MCG Subl Place 5,000 mcg under the tongue daily.        Allergies:  Allergies  Allergen Reactions   Penicillin G Anaphylaxis and Other (See Comments)    Has patient had a PCN reaction causing immediate rash, facial/tongue/throat swelling, SOB or lightheadedness with hypotension: Yes Has patient had a PCN reaction causing severe rash involving mucus membranes or skin necrosis: No Has patient had a PCN reaction that required hospitalization Yes Has patient had a PCN reaction occurring within the last 10 years: No If all of the above answers are "NO", then may  proceed with Cephalosporin use.    Sulfa Antibiotics Rash    Stevens-Johnson rash   Tegaderm Ag Mesh [Silver] Dermatitis    Causes blistering wounds    Ibuprofen Other (See Comments)    Contraindicated with Xarelto.    Pregabalin Other (See Comments)     Ineffective   Sulfacetamide Sodium-Sulfur Rash    Family History: Family History  Problem Relation Age of Onset   Stroke Maternal Grandfather        67   Heart attack Maternal Grandfather    Glaucoma Maternal Grandfather    Macular degeneration Maternal Grandfather    Breast cancer Sister    Hypertension Mother    Psoriasis Mother    Other Mother        meningioma developed ~2019   Glaucoma Mother    Cancer Paternal Grandfather    Heart attack Paternal Grandfather    Stroke Paternal Uncle        age 35   Polycythemia Paternal Uncle    Stroke Maternal Grandmother    Congestive Heart Failure Maternal Grandmother    Heart attack Maternal Grandmother    Protein C deficiency Sister 25       Miscarriages   Breast cancer Maternal Aunt 36    Social History:  reports that he has never smoked. He has never used smokeless tobacco. He reports current alcohol use. He reports that he does not use drugs.  ROS: For pertinent review of systems please refer to history of present illness  Physical Exam: BP 137/87   Pulse (!) 106   Temp 98.3 F (36.8 C)   Ht 5' 9"  (1.753 m)   Wt 220 lb (99.8 kg)   BMI 32.49 kg/m   Constitutional:  Well nourished. Alert and oriented, No acute distress. HEENT: [place] AT, mask in place.  Trachea midline Cardiovascular: No clubbing, cyanosis, or edema. Respiratory: Normal respiratory effort, no increased work of breathing. Neurologic: Grossly intact, no focal deficits, moving all 4 extremities. Psychiatric: Normal mood and affect.   Laboratory Data: Lab Results  Component Value Date   WBC 4.0 08/24/2020   HGB 15.0 08/24/2020   HCT 46.2 08/24/2020   MCV 82.9 08/24/2020   PLT 178 08/24/2020    Lab Results  Component Value Date   CREATININE 1.04 08/24/2020    Lab Results  Component Value Date   AST 32 08/24/2020   Lab Results  Component Value Date   ALT 29 08/24/2020   Urinalysis: Component     Latest Ref Rng & Units 08/21/2020  Specific  Gravity, UA     1.005 - 1.030 1.015  pH, UA     5.0 - 7.5 6.0  Color, UA     Yellow Yellow  Appearance Ur     Clear Clear  Leukocytes,UA     Negative Negative  Protein,UA     Negative/Trace Negative  Glucose, UA     Negative Negative  Ketones, UA     Negative Negative  RBC, UA     Negative Negative  Bilirubin, UA     Negative Negative  Urobilinogen, Ur     0.2 - 1.0 mg/dL 0.2  Nitrite, UA     Negative Negative  Microscopic Examination      See below:   Component     Latest Ref Rng & Units 08/21/2020  WBC, UA     0 - 5 /hpf None seen  RBC     4.22 - 5.81 MIL/uL None seen  Epithelial Cells (non renal)     0 - 10 /hpf 0-10  Bacteria, UA     None seen/Few None seen   I have reviewed the labs.   Pertinent Imaging: CLINICAL DATA:  Congenital LEFT UPJ obstruction with hydronephrosis   EXAM: NUCLEAR MEDICINE RENAL SCAN WITH DIURETIC ADMINISTRATION   TECHNIQUE: Radionuclide angiographic and sequential renal images were obtained after intravenous injection of radiopharmaceutical. Imaging was continued during slow intravenous injection of Lasix approximately 15 minutes after the start of the examination.   RADIOPHARMACEUTICALS:  4.99 mCi Technetium-53mMAG3 IV   Pharmaceutical: Lasix 50 mg IV   COMPARISON:  04/19/2019   Correlation: CT abdomen and pelvis 01/04/2020   FINDINGS: Flow:  Prompt symmetric arterial flow to the kidneys.   Left renogram: Normal uptake and concentration of tracer. Photopenic defect LEFT kidney from dilated collecting system. Gradual fill-in of tracer over the course of the exam. Some washout of tracer occurs prior to Lasix administration but accelerate following diuretic administration. No residual tracer is seen within the collecting system at the conclusion of the exam. Analysis of the renogram curve demonstrates a delayed time to peak activity of 16.2 minutes with a fall to half maximum activity at 37.4 minutes.   Right  renogram: Normal uptake, concentration and excretion of tracer. Good clearance of tracer before and continuing following Lasix administration. No residual tracer within RIGHT kidney at conclusion of exam. Analysis of renogram curve demonstrates a time to peak activity of 6.6 minutes with a fall to half maximum activity at approximately 14.2 minutes.   Differential:   Left kidney = 47 %   Right kidney = 53 %   T1/2 post Lasix :   Left kidney = 16.4 min   Right kidney = 14.3 min   IMPRESSION: Dilated LEFT renal collecting system though there is adequate clearance of tracer following diuretic administration, indicating an absence of significant urinary outflow obstruction.   Normal RIGHT renogram.     Electronically Signed   By: MLavonia DanaM.D.   On: 08/06/2020 16:13 I have independently reviewed the films.  See HPI.      Assessment & Plan:    1. Gross hematuria -UA benign -urine culture pending -AUA hematuria risk: high -CT urogram pending  2. Flank pain -renal lasix scan with no high grade obstruction -CT urogram pending  3. Chronic left UPJ obstruction -see # 2   Return for CT urogram .  These notes generated with voice recognition software. I apologize for typographical errors.  SZara Council PA-C  [facility] [address] [place] [phone]

## 2020-08-20 NOTE — Telephone Encounter (Signed)
I will send another message to Dr. Victorino December nurse to see if she may assist in finding appt for the pt for pre op clearance.See previous notes.

## 2020-08-21 ENCOUNTER — Ambulatory Visit (INDEPENDENT_AMBULATORY_CARE_PROVIDER_SITE_OTHER): Payer: BC Managed Care – PPO | Admitting: Urology

## 2020-08-21 ENCOUNTER — Encounter: Payer: Self-pay | Admitting: Urology

## 2020-08-21 ENCOUNTER — Other Ambulatory Visit: Payer: Self-pay

## 2020-08-21 VITALS — BP 137/87 | HR 106 | Temp 98.3°F | Ht 69.0 in | Wt 220.0 lb

## 2020-08-21 DIAGNOSIS — R310 Gross hematuria: Secondary | ICD-10-CM

## 2020-08-21 DIAGNOSIS — I6330 Cerebral infarction due to thrombosis of unspecified cerebral artery: Secondary | ICD-10-CM

## 2020-08-21 DIAGNOSIS — Q6239 Other obstructive defects of renal pelvis and ureter: Secondary | ICD-10-CM

## 2020-08-21 LAB — MICROSCOPIC EXAMINATION
Bacteria, UA: NONE SEEN
RBC, Urine: NONE SEEN /hpf (ref 0–2)
WBC, UA: NONE SEEN /hpf (ref 0–5)

## 2020-08-21 LAB — URINALYSIS, COMPLETE
Bilirubin, UA: NEGATIVE
Glucose, UA: NEGATIVE
Ketones, UA: NEGATIVE
Leukocytes,UA: NEGATIVE
Nitrite, UA: NEGATIVE
Protein,UA: NEGATIVE
RBC, UA: NEGATIVE
Specific Gravity, UA: 1.015 (ref 1.005–1.030)
Urobilinogen, Ur: 0.2 mg/dL (ref 0.2–1.0)
pH, UA: 6 (ref 5.0–7.5)

## 2020-08-21 NOTE — Telephone Encounter (Signed)
RE: pre op appt please Received: 4 days ago [place], [place]ora [place], [place]!   I spoke with Jeneen Rinks and I'm sending an email to Bernardo Heater in regards to scheduling this pt with Dr. Loletha Grayer. Dr. Loletha Grayer and Lattie Haw are both off so I'm unable to get approval to squeeze this patient on Dr. Lurline Del schedule.   Thank you!

## 2020-08-22 ENCOUNTER — Encounter: Payer: Self-pay | Admitting: Hematology

## 2020-08-22 NOTE — Telephone Encounter (Signed)
Pt has appt with Dr. [name] 08/29/20. Will send notes to MD for up coming appt. Will send FYI to surgeon's office pt has appt 08/29/20.

## 2020-08-23 LAB — CULTURE, URINE COMPREHENSIVE

## 2020-08-24 ENCOUNTER — Other Ambulatory Visit: Payer: Self-pay

## 2020-08-24 ENCOUNTER — Inpatient Hospital Stay: Payer: BC Managed Care – PPO

## 2020-08-24 ENCOUNTER — Inpatient Hospital Stay: Payer: BC Managed Care – PPO | Attending: Hematology

## 2020-08-24 VITALS — BP 136/90 | HR 94 | Temp 98.4°F | Resp 16

## 2020-08-24 DIAGNOSIS — Z86718 Personal history of other venous thrombosis and embolism: Secondary | ICD-10-CM | POA: Diagnosis not present

## 2020-08-24 DIAGNOSIS — Z7901 Long term (current) use of anticoagulants: Secondary | ICD-10-CM | POA: Diagnosis not present

## 2020-08-24 DIAGNOSIS — D751 Secondary polycythemia: Secondary | ICD-10-CM | POA: Diagnosis not present

## 2020-08-24 DIAGNOSIS — D45 Polycythemia vera: Secondary | ICD-10-CM

## 2020-08-24 LAB — CBC WITH DIFFERENTIAL (CANCER CENTER ONLY)
Abs Immature Granulocytes: 0.01 10*3/uL (ref 0.00–0.07)
Basophils Absolute: 0 10*3/uL (ref 0.0–0.1)
Basophils Relative: 0 %
Eosinophils Absolute: 0 10*3/uL (ref 0.0–0.5)
Eosinophils Relative: 0 %
HCT: 46.2 % (ref 39.0–52.0)
Hemoglobin: 15 g/dL (ref 13.0–17.0)
Immature Granulocytes: 0 %
Lymphocytes Relative: 23 %
Lymphs Abs: 0.9 10*3/uL (ref 0.7–4.0)
MCH: 26.9 pg (ref 26.0–34.0)
MCHC: 32.5 g/dL (ref 30.0–36.0)
MCV: 82.9 fL (ref 80.0–100.0)
Monocytes Absolute: 0.4 10*3/uL (ref 0.1–1.0)
Monocytes Relative: 9 %
Neutro Abs: 2.7 10*3/uL (ref 1.7–7.7)
Neutrophils Relative %: 68 %
Platelet Count: 178 10*3/uL (ref 150–400)
RBC: 5.57 MIL/uL (ref 4.22–5.81)
RDW: 15.9 % — ABNORMAL HIGH (ref 11.5–15.5)
WBC Count: 4 10*3/uL (ref 4.0–10.5)
nRBC: 0 % (ref 0.0–0.2)

## 2020-08-24 LAB — COMPREHENSIVE METABOLIC PANEL WITH GFR
ALT: 29 U/L (ref 0–44)
AST: 32 U/L (ref 15–41)
Albumin: 4 g/dL (ref 3.5–5.0)
Alkaline Phosphatase: 253 U/L — ABNORMAL HIGH (ref 38–126)
Anion gap: 11 (ref 5–15)
BUN: 16 mg/dL (ref 6–20)
CO2: 27 mmol/L (ref 22–32)
Calcium: 8.9 mg/dL (ref 8.9–10.3)
Chloride: 100 mmol/L (ref 98–111)
Creatinine, Ser: 1.04 mg/dL (ref 0.61–1.24)
GFR, Estimated: >60 mL/min
Glucose, Bld: 134 mg/dL — ABNORMAL HIGH (ref 70–99)
Potassium: 4.7 mmol/L (ref 3.5–5.1)
Sodium: 138 mmol/L (ref 135–145)
Total Bilirubin: 0.7 mg/dL (ref 0.3–1.2)
Total Protein: 7 g/dL (ref 6.5–8.1)

## 2020-08-24 NOTE — Patient Instructions (Signed)
Therapeutic Phlebotomy Therapeutic phlebotomy is the planned removal of blood from a person's body for the purpose of treating a medical condition. The procedure is similar to donating blood. Usually, about a pint (470 mL, or 0.47 L) of blood is removed.The average adult has 9-12 pints (4.3-5.7 L) of blood in the body. Therapeutic phlebotomy may be used to treat the following medical conditions: Hemochromatosis. This is a condition in which the blood contains too much iron. Polycythemia vera. This is a condition in which the blood contains too many red blood cells. Porphyria cutanea tarda. This is a disease in which an important part of hemoglobin is not made properly. It results in the buildup of abnormal amounts of porphyrins in the body. Sickle cell disease. This is a condition in which the red blood cells form an abnormal crescent shape rather than a round shape. Tell a health care provider about: Any allergies you have. All medicines you are taking, including vitamins, herbs, eye drops, creams, and over-the-counter medicines. Any problems you or family members have had with anesthetic medicines. Any blood disorders you have. Any surgeries you have had. Any medical conditions you have. Whether you are pregnant or may be pregnant. What are the risks? Generally, this is a safe procedure. However, problems may occur, including: Nausea or light-headedness. Low blood pressure (hypotension). Soreness, bleeding, swelling, or bruising at the needle insertion site. Infection. What happens before the procedure? Follow instructions from your health care provider about eating or drinking restrictions. Ask your health care provider about: Changing or stopping your regular medicines. This is especially important if you are taking diabetes medicines or blood thinners (anticoagulants). Taking medicines such as aspirin and ibuprofen. These medicines can thin your blood. Do not take these medicines unless  your health care provider tells you to take them. Taking over-the-counter medicines, vitamins, herbs, and supplements. Wear clothing with sleeves that can be raised above the elbow. Plan to have someone take you home from the hospital or clinic. You may have a blood sample taken. Your blood pressure, pulse rate, and breathing rate will be measured. What happens during the procedure?  To lower your risk of infection: Your health care team will wash or sanitize their hands. Your skin will be cleaned with an antiseptic. You may be given a medicine to numb the area (local anesthetic). A tourniquet will be placed on your arm. A needle will be inserted into one of your veins. Tubing and a collection bag will be attached to that needle. Blood will flow through the needle and tubing into the collection bag. The collection bag will be placed lower than your arm to allow gravity to help the flow of blood into the bag. You may be asked to open and close your hand slowly and continually during the entire collection. After the specified amount of blood has been removed from your body, the collection bag and tubing will be clamped. The needle will be removed from your vein. Pressure will be held on the site of the needle insertion to stop the bleeding. A bandage (dressing) will be placed over the needle insertion site. The procedure may vary among health care providers and hospitals. What happens after the procedure? Your blood pressure, pulse rate, and breathing rate will be measured after the procedure. You will be encouraged to drink fluids. Your recovery will be assessed and monitored. You can return to your normal activities as told by your health care provider. Summary Therapeutic phlebotomy is the planned removal of  blood from a person's body for the purpose of treating a medical condition. Therapeutic phlebotomy may be used to treat hemochromatosis, polycythemia vera, porphyria cutanea tarda,  or sickle cell disease. In the procedure, a needle is inserted and about a pint (470 mL, or 0.47 L) of blood is removed. The average adult has 9-12 pints (4.3-5.7 L) of blood in the body. This is generally a safe procedure, but it can sometimes cause problems such as nausea, light-headedness, or low blood pressure (hypotension). This information is not intended to replace advice given to you by your health care provider. Make sure you discuss any questions you have with your healthcare provider. Document Revised: 03/05/2017 Document Reviewed: 03/05/2017 Elsevier Patient Education  [place].

## 2020-08-26 DIAGNOSIS — M5416 Radiculopathy, lumbar region: Secondary | ICD-10-CM

## 2020-08-26 DIAGNOSIS — L4050 Arthropathic psoriasis, unspecified: Secondary | ICD-10-CM

## 2020-08-27 MED ORDER — OXYCODONE HCL 10 MG PO TABS: 10.0000 mg | ORAL_TABLET | ORAL | 0 refills | Status: DC | PRN

## 2020-08-27 MED ORDER — LUBIPROSTONE 24 MCG PO CAPS: 24.0000 ug | ORAL_CAPSULE | Freq: Two times a day (BID) | ORAL | 5 refills | Status: DC

## 2020-08-27 NOTE — Telephone Encounter (Signed)
Refilled oxycodone and sent in new rx for 17m bid amitiza

## 2020-08-29 ENCOUNTER — Ambulatory Visit (INDEPENDENT_AMBULATORY_CARE_PROVIDER_SITE_OTHER): Payer: BC Managed Care – PPO | Admitting: Cardiovascular Disease

## 2020-08-29 ENCOUNTER — Other Ambulatory Visit: Payer: Self-pay

## 2020-08-29 ENCOUNTER — Encounter: Payer: Self-pay | Admitting: Cardiovascular Disease

## 2020-08-29 VITALS — BP 110/62 | HR 94 | Ht 69.0 in

## 2020-08-29 DIAGNOSIS — G9519 Other vascular myelopathies: Secondary | ICD-10-CM | POA: Diagnosis not present

## 2020-08-29 DIAGNOSIS — I5032 Chronic diastolic (congestive) heart failure: Secondary | ICD-10-CM | POA: Diagnosis not present

## 2020-08-29 DIAGNOSIS — Z7901 Long term (current) use of anticoagulants: Secondary | ICD-10-CM

## 2020-08-29 DIAGNOSIS — Z01810 Encounter for preprocedural cardiovascular examination: Secondary | ICD-10-CM

## 2020-08-29 DIAGNOSIS — G4733 Obstructive sleep apnea (adult) (pediatric): Secondary | ICD-10-CM | POA: Diagnosis not present

## 2020-08-29 DIAGNOSIS — D6859 Other primary thrombophilia: Secondary | ICD-10-CM

## 2020-08-29 NOTE — Patient Instructions (Signed)

## 2020-08-29 NOTE — Progress Notes (Signed)
Cardiology Office Note:    Date:  08/29/2020   ID:  Jesse Bowers, DOB [birth year], MRN [id number]  PCP:  Shawnee Knapp, MD  Cardiologist:  Sanda Klein, MD   Referring MD: Shawnee Knapp, MD   No chief complaint on file.   History of Present Illness:    Jesse Bowers is a 55 y.o. adult with a hx of chronic diastolic heart failure, obstructive sleep apnea on CPAP, history of previous stroke on chronic anticoagulation with Xarelto (protein C deficiency) returning for routine follow-up. 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  Jesse Bowers is planning gender affirming urological intervention with Dr. Francesca Jewett at [facility].  It is likely that he will be admitted for observation overnight, but there is a possibility he will be discharged the same day.  He is done quite well from a cardiac point of view over the last several months.  He had a brief period of volume excess after he underwent his thumb surgery on March 18, but once that resolved his weight has been very stable at around 219 pounds which is his usual "dry weight".  He has not required any diuretic dose adjustments in the last 3 months or so.  Denies problems with angina, dizziness, syncope, palpitations, focal neurological complaints.  He has not had any symptoms of orthostatic hypotension (this was a problem when he was taking prazosin).  As always he is very sedentary due to the sequelae of his old stroke and his back and peripheral orthopedic issues.  He is in his electrical scooter today, as usual.  His last echo was 04/27/2020. He has preserved left ventricular systolic function (TD>32%, GLS -24%), but with some echo evidence of diastolic dysfunction (mildly decreased e' for age, E/A 0.7).   He has complex obstructive and central sleep apnea, with mild daytime hypersomnolence, using his BiPAP. On MTX and Cosentyx for psoriatic arthritis.  His daughter is having a successful  time in college at [employer] in [place], where she swims competitively.  Past Medical History:  Diagnosis Date   Abnormal weight loss    Anxiety    Arthritis    Cataract    OU   Celiac disease    Cervical neck pain with evidence of disc disease    patient has a cyst    Chronic constipation    Chronic diastolic heart failure (HCC)    Pt. 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   Family history of adverse reaction to anesthesia    family has problems with anesthesia of nausea and vomiting    Male-to-male transgender person    GERD (gastroesophageal reflux disease)    History of in 20's   Gluten enteropathy    H/O parotitis    right    Hard of hearing    History of kidney stones    History of retinal tear    Bilateral   History of staph infection    required wound vac   Hx-TIA (transient ischemic attack)    2015   LVH (left ventricular hypertrophy) 12/15/2016   Mild, noted on ECHO   MVP (mitral valve prolapse)  NAFL (nonalcoholic fatty liver)    Neck pain    Neuromuscular disorder (HCC)    bilateral neuropathy feet.   Nuclear sclerotic cataract of both eyes 09/08/2019   Pneumonia 12/17/2010   Polycythemia    Polycythemia, secondary    PONV (postoperative nausea and vomiting)    Protein C deficiency (HCC)    Dr. Anne Fu   Psoriasis    16 X10 cm psoriatic rash on sole of left foot ; open and occ scant bleeding;    psoriatic arthritis    PTSD (post-traumatic stress disorder)    Scaphoid fracture of wrist 09/23/2013   Seizure ([place])    childhood, medication until age 30 then weanned completely off   Sleep apnea    split night study last done by Dr. [name] 06/18/15 shows severe OSA, CSA, and hypersomnia, rec bipap    Splenomegaly    Stenosis of ureteropelvic junction (UPJ)    left   Stroke [facility])    CVA vs TIA in left cerebrum causing slight right sided weakness-Dr. [name] follows   Syrinx of spinal cord ([place]) 01/06/2014   c spine on MRI   Tachycardia    hx of    Transfusion history    past history- none recent, after surgeries due to blood loss   Wears glasses    Wears hearing aid     Past Surgical History:  Procedure Laterality Date   ABDOMINAL HYSTERECTOMY Bilateral 1994   TAH, BSO- tranverse incision at 55 yo   ANKLE ARTHROSCOPY WITH RECONSTRUCTION Right 2007   CHOLECYSTECTOMY     laparoscopic   COLONOSCOPY     x3   EYE SURGERY     Left eye 03/02/2018, right 02/15/2018   HIP ARTHROSCOPY W/ LABRAL REPAIR Right 05/11/2013   acetabular labral tear 03/30/2013   KNEE ARTHROPLASTY Right    KNEE JOINT MANIPULATION Left    x3 under anesthesia   KNEE SURGERY Bilateral 1984   Right ACL, left PCL repair   LITHOTRIPSY  2005   LIVER BIOPSY  2013   normal results.   MASTECTOMY Bilateral    prior to 2009   MOUTH SURGERY     NASAL SEPTUM SURGERY N/A 09/20/2015   by ENT Dr. [name]   OVARIAN CYST SURGERY Left    size of grapefruit, was informed that she had shortened vagina   SHOULDER SURGERY Bilateral    Right 08/15/2016, Left 11/15/2016   THUMB ARTHROSCOPY Left    THYROIDECTOMY, PARTIAL Left 2008   TOTAL KNEE ARTHROPLASTY Right 08/23/2018   Procedure: TOTAL KNEE ARTHROPLASTY;  Surgeon: [name], MD;  Location: [facility];  Service: Orthopedics;  Laterality: Right;  73mn   TOTAL KNEE REVISION Left 02/06/2016   Procedure: LEFT TOTAL KNEE REVISION;  Surgeon: FGaynelle Arabian MD;  Location: [facility];  Service: Orthopedics;  Laterality: Left;   TOTAL KNEE REVISION Left 04/22/2017   Procedure: Left knee polyethylene revision;  Surgeon: AGaynelle Arabian MD;  Location: [facility];  Service: Orthopedics;  Laterality: Left;   UPPER GI ENDOSCOPY  2003    Current Medications: Current Meds  Medication Sig    acyclovir ointment (ZOVIRAX) 5 % as needed.   baclofen (LIORESAL) 20 MG tablet TAKE 1 TABLET BY MOUTH 4 TIMES DAILY.   Calcipotriene-Betameth Diprop 0.005-0.064 % FOAM Apply 1 application topically 2 (two) times daily as needed.    clorazepate (TRANXENE) 15 MG tablet Take 15 mg by mouth at bedtime.   clorazepate (TRANXENE) 7.5 MG tablet Take 7.5 mg by  mouth 2 (two) times daily.   COSENTYX SENSOREADY, 300 MG, 150 MG/ML SOAJ Inject 2 Syringes into the skin every 28 (twenty-eight) days.   Cyanocobalamin (VITAMIN B-12) 5000 MCG SUBL Place 5,000 mcg under the tongue daily.   desonide (DESOWEN) 0.05 % ointment Apply 1 application topically 2 (two) times daily as needed (psoriasis).    ergocalciferol (VITAMIN D2) 1.25 MG (50000 UT) capsule Take 1 capsule by mouth once a week.   Eszopiclone 3 MG TABS Take 3 mg by mouth at bedtime. Take immediately before bedtime   finasteride (PROPECIA) 1 MG tablet Take 1 mg by mouth daily.   Fluocinolone Acetonide 0.01 % OIL Place 3 drops into both ears 2 (two) times daily as needed for itching.   folic acid (FOLVITE) 1 MG tablet Take 1 mg by mouth daily.   furosemide (LASIX) 20 MG tablet TAKE 1 TABLET BY MOUTH DAILY. TO BE TAKEN WITH THE 40 MG TO EQUAL 60 MG DAILY   furosemide (LASIX) 40 MG tablet TAKE 1 TABLET BY MOUTH DAILY. TO BE TAKEN WITH THE 20 MG TABLET TO EQUAL 60 MG DAILY.   Lifitegrast (XIIDRA) 5 % SOLN Xiidra 5 % eye drops in a dropperette   lubiprostone (AMITIZA) 24 MCG capsule Take 24 mcg by mouth 2 (two) times daily with a meal.   methotrexate (RHEUMATREX) 2.5 MG tablet Take 15 mg by mouth once a week. Caution:Chemotherapy. Protect from light.   morphine (KADIAN) 20 MG 24 hr capsule SMARTSIG:1 Capsule(s) By Mouth Morning-Night   morphine (KADIAN) 30 MG 24 hr capsule SMARTSIG:1 Capsule(s) By Mouth Morning-Night   neomycin-polymyxin-hydrocortisone (CORTISPORIN) 3.5-10000-1 OTIC suspension Place 4 drops into both ears 2 (two) times daily as needed (ear  pain).    Oxycodone HCl 10 MG TABS Take 1 tablet (10 mg total) by mouth every 4 (four) hours as needed.   potassium chloride (KLOR-CON) 10 MEQ tablet TAKE 2 TABLETS BY MOUTH EVERY DAY   rivaroxaban (XARELTO) 20 MG TABS tablet TAKE 1 TABLET(20 MG) BY MOUTH EVERY NIGHT AT BEDTIME   testosterone cypionate (DEPOTESTOSTERONE CYPIONATE) 200 MG/ML injection INJECT 0.5 MLS (100 MG TOTAL) INTO THE MUSCLE ONCE A WEEK.   ursodiol (ACTIGALL) 250 MG tablet Take 250 mg by mouth 2 (two) times daily with a meal.    ursodiol (ACTIGALL) 500 MG tablet Take 1 tablet midday with lunch and 1 tablet in the evening with dinneer   venlafaxine XR (EFFEXOR-XR) 150 MG 24 hr capsule Take 2 capsules (300 mg total) by mouth daily with breakfast.     Allergies:   Penicillin g, Sulfa antibiotics, Tegaderm ag mesh [silver], Ibuprofen, Pregabalin, and Sulfacetamide sodium-sulfur   Social History   Socioeconomic History   Marital status: Married    Spouse name: Not on file   Number of children: 2   Years of education: 4y college   Highest education level: Not on file  Occupational History   Occupation: Pediatric Nurse practitioner    Comment: Not working since CVA 2015  Tobacco Use   Smoking status: Never   Smokeless tobacco: Never  Vaping Use   Vaping Use: Never used  Substance and Sexual Activity   Alcohol use: Yes    Comment: social   Drug use: No   Sexual activity: Yes    Birth control/protection: None    Comment: patient is a transgender on testosterone shots, no biological kids  Other Topics Concern   Not on file  Social History Narrative   Education 4 year college, former  RN X 15 years, pediatric nurse practitioner x 6 years, did NP degree from [employer] [place]. Relocated to [place] about 2 months ago from [place], [place]. Patient was in [place] for last 4 years and prior to that in [place]. His wife is working as Scientist, research (physical sciences) for [employer]. Patient is not working and applying for disability. They  have 2 kids but no biologic children.    Social Determinants of Health   Financial Resource Strain: Not on file  Food Insecurity: Not on file  Transportation Needs: Not on file  Physical Activity: Not on file  Stress: Not on file  Social Connections: Not on file     Family History: The patient's family history includes Breast cancer in his sister; Breast cancer (age of onset: 51) in his maternal aunt; Cancer in his paternal grandfather; Congestive Heart Failure in his maternal grandmother; Glaucoma in his maternal grandfather and mother; Heart attack in his maternal grandfather, maternal grandmother, and paternal grandfather; Hypertension in his mother; Macular degeneration in his maternal grandfather; Other in his mother; Polycythemia in his paternal uncle; Protein C deficiency (age of onset: 6) in his sister; Psoriasis in his mother; Stroke in his maternal grandfather, maternal grandmother, and paternal uncle.  ROS:   Please see the history of present illness.    All other systems are reviewed and are negative  EKGs/Labs/Other Studies Reviewed:    EKG: EKG is ordered today and personally reviewed.  It is very similar to previous tracings showing normal sinus rhythm, borderline criteria for left posterior fascicular block and mild T wave inversion leads III and aVF.  Recent Labs: 08/24/2020: ALT 29; BUN 16; Creatinine, Ser 1.04; Hemoglobin 15.0; Platelet Count 178; Potassium 4.7; Sodium 138  Recent Lipid Panel    Component Value Date/Time   CHOL 142 01/05/2018 1509   TRIG 45 01/05/2018 1509   HDL 52 01/05/2018 1509   CHOLHDL 2.7 01/05/2018 1509   LDLCALC 81 01/05/2018 1509   06/13/2020  Cholesterol 128, HDL 42, TG 41  Hgb 13.1, low MCV, WBC 3.6, plt 167 Ferritin 9, Transferin saturation 7%  Physical Exam:    VS:  BP 110/62   Pulse 94   Ht 5' 9"  (1.753 m)   SpO2 95%   BMI 32.49 kg/m     Wt Readings from Last 3 Encounters:  08/21/20 220 lb (99.8 kg)  07/27/20 221 lb  (100.2 kg)  06/22/20 221 lb 12.8 oz (100.6 kg)      General: Alert, oriented x3, no distress, in electric wheelchair Head: no evidence of trauma, PERRL, EOMI, no exophtalmos or lid lag, no myxedema, no xanthelasma; normal ears, nose and oropharynx Neck: normal jugular venous pulsations and no hepatojugular reflux; brisk carotid pulses without delay and no carotid bruits Chest: clear to auscultation, no signs of consolidation by percussion or palpation, normal fremitus, symmetrical and full respiratory excursions Cardiovascular: normal position and quality of the apical impulse, regular rhythm, normal first and second heart sounds, no murmurs, rubs or gallops Abdomen: no tenderness or distention, no masses by palpation, no abnormal pulsatility or arterial bruits, normal bowel sounds, no hepatosplenomegaly Extremities: no clubbing, cyanosis or edema; 2+ radial, ulnar and brachial pulses bilaterally; 2+ right femoral, posterior tibial and dorsalis pedis pulses; 2+ left femoral, posterior tibial and dorsalis pedis pulses; no subclavian or femoral bruits Neurological: grossly nonfocal Psych: Normal mood and affect    ASSESSMENT:    No diagnosis found.  PLAN:    In order of problems listed above:  CHF: Well compensated, clinically euvolemic.  Has mild diastolic dysfunction.  Avoid excessive intravenous fluid administration with his upcoming surgery. Neurogenic claudication: normal ABIs in September 2020.  OSA: Compliant with BiPAP.  Fewer complaints of daytime hypersomnolence today.  Protein C deficiency: On chronic rivaroxaban. Reported history of stroke. MRI stable 2016-2017-2019: "tiny punctate periventricular and subcortical white matter hyperintensities likely due to chronic microvascular ischemia", "Scattered chronic cerebral white matter signal abnormality is nonspecific but compatible with sequelae of hypercoagulable state".  He does not have a history of atrial fibrillation.  Brief  interruptions in anticoagulation for surgery should be low risk.  Due to his immobility, I am more concerned about the possibility of DVT, than I am about the risk of stroke. Anticoagulation: Okay for him to interrupt the anticoagulant for 2-3 days before this procedure and restarted as soon as his surgeon tells him that it is safe from a bleeding standpoint. Preop exam: Low to moderate risk of complications with interruption of anticoagulation and planned surgery.  No additional investigation or change in therapy is recommended before his surgery from a cardiovascular point of view.  Letter sent to his Urologist via epic.    Medication Adjustments/Labs and Tests Ordered: Current medicines are reviewed at length with the patient today.  Concerns regarding medicines are outlined above.  No orders of the defined types were placed in this encounter.  No orders of the defined types were placed in this encounter.   There are no Patient Instructions on file for this visit.   Signed, Sanda Klein, MD  08/29/2020 3:30 PM    [place]

## 2020-08-30 ENCOUNTER — Ambulatory Visit (INDEPENDENT_AMBULATORY_CARE_PROVIDER_SITE_OTHER): Payer: BC Managed Care – PPO | Admitting: Psychology

## 2020-08-30 ENCOUNTER — Encounter: Payer: Self-pay | Admitting: Hematology

## 2020-08-30 ENCOUNTER — Encounter: Payer: Self-pay | Admitting: Cardiovascular Disease

## 2020-08-30 DIAGNOSIS — F4312 Post-traumatic stress disorder, chronic: Secondary | ICD-10-CM | POA: Diagnosis not present

## 2020-08-30 DIAGNOSIS — F4323 Adjustment disorder with mixed anxiety and depressed mood: Secondary | ICD-10-CM

## 2020-08-31 DIAGNOSIS — N200 Calculus of kidney: Secondary | ICD-10-CM

## 2020-08-31 HISTORY — DX: Calculus of kidney: N20.0

## 2020-08-31 NOTE — Progress Notes (Signed)
I called Jesse Bowers to alert him of possible COVID-19 exposure during our clinic visit, since I tested positive for the illness the next morning (I had a negative test on the morning of the appointment). I asked him to monitor for fever, cough, dyspnea, sore throat, etc. And to test for COVID if these occur, call me or PCP to see if additional treatment is warranted.

## 2020-09-06 ENCOUNTER — Ambulatory Visit (INDEPENDENT_AMBULATORY_CARE_PROVIDER_SITE_OTHER): Payer: BC Managed Care – PPO | Admitting: Otolaryngology

## 2020-09-06 ENCOUNTER — Other Ambulatory Visit: Payer: Self-pay

## 2020-09-06 DIAGNOSIS — H6063 Unspecified chronic otitis externa, bilateral: Secondary | ICD-10-CM | POA: Diagnosis not present

## 2020-09-06 DIAGNOSIS — H6123 Impacted cerumen, bilateral: Secondary | ICD-10-CM

## 2020-09-06 NOTE — Progress Notes (Signed)
HPI: Jesse Bowers is a 55 y.o. adult who returns today for evaluation of blockage of his hearing in both ears but worse on the right side.  He relates that he was recently stung by several yellow jackets on the right side of his face and right ear.  He has history of chronic eczema of the ear canals as well as chronic external otitis.  He presents today to have his ears cleaned.  He has been using alcohol vinegar ear rinses in the past to help reduce the chronic inflammatory changes in the ear canals..  Past Medical History:  Diagnosis Date   Abnormal weight loss    Anxiety    Arthritis    Cataract    OU   Celiac disease    Cervical neck pain with evidence of disc disease    patient has a cyst    Chronic constipation    Chronic diastolic heart failure (HCC)    Pt. 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   Family history of adverse reaction to anesthesia    family has problems with anesthesia of nausea and vomiting    Male-to-male transgender person    GERD (gastroesophageal reflux disease)    History of in 20's   Gluten enteropathy    H/O parotitis    right    Hard of hearing    History of kidney stones    History of retinal tear    Bilateral   History of staph infection    required wound vac   Hx-TIA (transient ischemic attack)    2015   LVH (left ventricular hypertrophy) 12/15/2016   Mild, noted on ECHO   MVP (mitral valve prolapse)    NAFL (nonalcoholic fatty liver)    Neck pain    Neuromuscular disorder ([place])    bilateral neuropathy feet.   Nuclear sclerotic cataract of both eyes 09/08/2019   Pneumonia 12/17/2010   Polycythemia    Polycythemia, secondary    PONV (postoperative nausea and vomiting)    Protein C deficiency (HCC)    Dr. Anne Fu    Psoriasis    16 X10 cm psoriatic rash on sole of left foot ; open and occ scant bleeding;    psoriatic arthritis    PTSD (post-traumatic stress disorder)    Scaphoid fracture of wrist 09/23/2013   Seizure ([place])    childhood, medication until age 77 then weanned completely off   Sleep apnea    split night study last done by Dr. 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 [name] follows   Syrinx of spinal cord ([place]) 01/06/2014   c spine on MRI   Tachycardia    hx of    Transfusion history    past history- none recent, after surgeries due to blood loss   Wears glasses    Wears hearing aid    Past Surgical History:  Procedure Laterality Date   ABDOMINAL HYSTERECTOMY Bilateral 1994   TAH, BSO- tranverse incision at 55 yo  ANKLE ARTHROSCOPY WITH RECONSTRUCTION Right 2007   CHOLECYSTECTOMY     laparoscopic   COLONOSCOPY     x3   EYE SURGERY     Left eye 03/02/2018, right 02/15/2018   HIP ARTHROSCOPY W/ LABRAL REPAIR Right 05/11/2013   acetabular labral tear 03/30/2013   KNEE ARTHROPLASTY Right    KNEE JOINT MANIPULATION Left    x3 under anesthesia   KNEE SURGERY Bilateral 1984   Right ACL, left PCL repair   LITHOTRIPSY  2005   LIVER BIOPSY  2013   normal results.   MASTECTOMY Bilateral    prior to 2009   MOUTH SURGERY     NASAL SEPTUM SURGERY N/A 09/20/2015   by ENT Dr. [name]   OVARIAN CYST SURGERY Left    size of grapefruit, was informed that she had shortened vagina   SHOULDER SURGERY Bilateral    Right 08/15/2016, Left 11/15/2016   THUMB ARTHROSCOPY Left    THYROIDECTOMY, PARTIAL Left 2008   TOTAL KNEE ARTHROPLASTY Right 08/23/2018   Procedure: TOTAL KNEE ARTHROPLASTY;  Surgeon: [name], MD;  [facility];  Service: Orthopedics;  Laterality: Right;  10mn   TOTAL KNEE REVISION Left  02/06/2016   Procedure: LEFT TOTAL KNEE REVISION;  Surgeon: FGaynelle Arabian MD;  [facility];  Service: Orthopedics;  Laterality: Left;   TOTAL KNEE REVISION Left 04/22/2017   Procedure: Left knee polyethylene revision;  Surgeon: AGaynelle Arabian MD;  [facility];  Service: Orthopedics;  Laterality: Left;   UPPER GI ENDOSCOPY  2003   Social History   Socioeconomic History   Marital status: Married    Spouse name: Not on file   Number of children: 2   Years of education: 4y college   Highest education level: Not on file  Occupational History   Occupation: Pediatric Nurse practitioner    Comment: Not working since CVA 2015  Tobacco Use   Smoking status: Never   Smokeless tobacco: Never  Vaping Use   Vaping Use: Never used  Substance and Sexual Activity   Alcohol use: Yes    Comment: social   Drug use: No   Sexual activity: Yes    Birth control/protection: None    Comment: patient is a transgender on testosterone shots, no biological kids  Other Topics Concern   Not on file  Social History Narrative   Education 4 year college, former RTherapist, 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 [place] was in [place] years [place] His wife is working as HScientist, research (physical sciences)for [employer] Patient is not working and applying for disability. They have 2 kids but no biologic children.    Social Determinants of Health   Financial Resource Strain: Not on file  Food Insecurity: Not on file  Transportation Needs: Not on file  Physical Activity: Not on file  Stress: Not on file  Social Connections: Not on file   Family History  Problem Relation Age of Onset   Stroke Maternal Grandfather        515  Heart attack Maternal Grandfather    Glaucoma Maternal Grandfather    Macular degeneration Maternal Grandfather    Breast cancer Sister    Hypertension  Mother    Psoriasis Mother    Other Mother        meningioma developed ~2019   Glaucoma Mother  Cancer Paternal Grandfather    Heart attack Paternal Grandfather    Stroke Paternal Uncle        age 42   Polycythemia Paternal Uncle    Stroke Maternal Grandmother    Congestive Heart Failure Maternal Grandmother    Heart attack Maternal Grandmother    Protein C deficiency Sister 94       Miscarriages   Breast cancer Maternal Aunt 35   Allergies  Allergen Reactions   Penicillin G Anaphylaxis and Other (See Comments)    Has patient had a PCN reaction causing immediate rash, facial/tongue/throat swelling, SOB or lightheadedness with hypotension: Yes Has patient had a PCN reaction causing severe rash involving mucus membranes or skin necrosis: No Has patient had a PCN reaction that required hospitalization Yes Has patient had a PCN reaction occurring within the last 10 years: No If all of the above answers are "NO", then may proceed with Cephalosporin use.    Sulfa Antibiotics Rash    Stevens-Johnson rash   Tegaderm Ag Mesh [Silver] Dermatitis    Causes blistering wounds    Ibuprofen Other (See Comments)    Contraindicated with Xarelto.    Pregabalin Other (See Comments)    Ineffective   Sulfacetamide Sodium-Sulfur Rash   Prior to Admission medications   Medication Sig Start Date End Date Taking? Authorizing Provider  acyclovir ointment (ZOVIRAX) 5 % as needed. 04/11/20   [provider]  baclofen (LIORESAL) 20 MG tablet TAKE 1 TABLET BY MOUTH 4 TIMES DAILY. 06/13/20   Meredith Staggers, MD  Calcipotriene-Betameth Diprop 0.005-0.064 % FOAM Apply 1 application topically 2 (two) times daily as needed.     [provider]  clorazepate (TRANXENE) 15 MG tablet Take 15 mg by mouth at bedtime.    [provider]  clorazepate (TRANXENE) 7.5 MG tablet Take 7.5 mg by mouth 2 (two) times daily.    [provider]  COSENTYX SENSOREADY, 300 MG, 150 MG/ML SOAJ  Inject 2 Syringes into the skin every 28 (twenty-eight) days. 03/16/20   [provider]  Cyanocobalamin (VITAMIN B-12) 5000 MCG SUBL Place 5,000 mcg under the tongue daily. 05/01/20   [provider]  desonide (DESOWEN) 0.05 % ointment Apply 1 application topically 2 (two) times daily as needed (psoriasis).  08/04/15   [provider]  ergocalciferol (VITAMIN D2) 1.25 MG (50000 UT) capsule Take 1 capsule by mouth once a week. 02/09/20   [provider]  Eszopiclone 3 MG TABS Take 3 mg by mouth at bedtime. Take immediately before bedtime    [provider]  finasteride (PROPECIA) 1 MG tablet Take 1 mg by mouth daily.    [provider]  Fluocinolone Acetonide 0.01 % OIL Place 3 drops into both ears 2 (two) times daily as needed for itching. 06/09/18   [provider]  folic acid (FOLVITE) 1 MG tablet Take 1 mg by mouth daily. 11/21/19   [provider]  furosemide (LASIX) 20 MG tablet TAKE 1 TABLET BY MOUTH DAILY. TO BE TAKEN WITH THE 40 MG TO EQUAL 60 MG DAILY 08/07/20   Croitoru, Mihai, MD  furosemide (LASIX) 40 MG tablet TAKE 1 TABLET BY MOUTH DAILY. TO BE TAKEN WITH THE 20 MG TABLET TO EQUAL 60 MG DAILY. 08/07/20   Croitoru, Mihai, MD  Lifitegrast Shirley Friar) 5 % SOLN Xiidra 5 % eye drops in a dropperette    [provider]  lubiprostone (AMITIZA) 24 MCG capsule Take 24 mcg by mouth 2 (two)  times daily with a meal.    [provider]  methotrexate (RHEUMATREX) 2.5 MG tablet Take 15 mg by mouth once a week. Caution:Chemotherapy. Protect from light.    [provider]  morphine (KADIAN) 20 MG 24 hr capsule SMARTSIG:1 Capsule(s) By Mouth Morning-Night 08/08/20   [provider]  morphine (KADIAN) 30 MG 24 hr capsule SMARTSIG:1 Capsule(s) By Mouth Morning-Night 08/06/20   [provider]  neomycin-polymyxin-hydrocortisone (CORTISPORIN) 3.5-10000-1 OTIC suspension Place 4 drops into both ears 2 (two) times  daily as needed (ear pain).  03/31/17   [provider]  Oxycodone HCl 10 MG TABS Take 1 tablet (10 mg total) by mouth every 4 (four) hours as needed. 08/27/20   Meredith Staggers, MD  potassium chloride (KLOR-CON) 10 MEQ tablet TAKE 2 TABLETS BY MOUTH EVERY DAY 12/15/19   Troy Sine, MD  rivaroxaban (XARELTO) 20 MG TABS tablet TAKE 1 TABLET(20 MG) BY MOUTH EVERY NIGHT AT BEDTIME 03/21/20   [name], MD  testosterone cypionate (DEPOTESTOSTERONE CYPIONATE) 200 MG/ML injection INJECT 0.5 MLS (100 MG TOTAL) INTO THE MUSCLE ONCE A WEEK. 03/15/18   Shawnee Knapp, MD  ursodiol (ACTIGALL) 250 MG tablet Take 250 mg by mouth 2 (two) times daily with a meal.     [provider]  ursodiol (ACTIGALL) 500 MG tablet Take 1 tablet midday with lunch and 1 tablet in the evening with dinneer 05/23/20   Leonie Man, MD  venlafaxine XR (EFFEXOR-XR) 150 MG 24 hr capsule Take 2 capsules (300 mg total) by mouth daily with breakfast. 10/14/17   Meredith Staggers, MD     Positive ROS: Otherwise negative  All other systems have been reviewed and were otherwise negative with the exception of those mentioned in the HPI and as above.  Physical Exam: Constitutional: Alert, well-appearing, no acute distress Ears: External ears without lesions or tenderness.  He has chronic inflammatory changes of both ear canals with slight swelling especially on the left side.  He has debris adjacent to both TMs that was cleaned with hydroperoxide and suction.  After cleaning the ear canals I applied gentian violet Ciprodex and CSF powder to both ear canals. Nasal: External nose without lesions.. Clear nasal passages Oral: Lips and gums without lesions. Tongue and palate mucosa without lesions. Posterior oropharynx clear. Neck: No palpable adenopathy or masses Respiratory: Breathing comfortably  Skin: No facial/neck lesions or rash noted.  Cerumen impaction removal  Date/Time: 09/06/2020 4:18 PM Performed by: Rozetta Nunnery, MD Authorized by: Rozetta Nunnery, MD   Consent:    Consent obtained:  Verbal   Consent given by:  Patient   Risks discussed:  Pain and bleeding Procedure details:    Location:  L ear and R ear   Procedure type: suction   Post-procedure details:    Inspection:  TM intact and canal normal   Hearing quality:  Improved   Procedure completion:  Tolerated well, no immediate complications Comments:     Ear canals were cleaned with hydroperoxide and alcohol or ear rinses and suction.  After cleaning the ear canals I applied gentian violet, Ciprodex and CSF powder to both ear canals.  Assessment: Chronic bilateral external otitis with debris within both ear canals that was cleaned in the office.  Plan: Applied gentian violet Ciprodex and CSF powder to both ear canals recommend keeping the ear canals dry for the next 24 hours.  He is headed to the beach and I think it is  okay to get water in the ears within a couple days.  He has alcohol vinegar ear rinses to use if needed. He will follow-up as needed.   Radene Journey, MD

## 2020-09-07 ENCOUNTER — Ambulatory Visit
Admission: RE | Admit: 2020-09-07 | Discharge: 2020-09-07 | Disposition: A | Discharge status: Home / Self Care | Payer: BC Managed Care – PPO | Source: Ambulatory Visit | Attending: Urology | Admitting: Urology

## 2020-09-07 DIAGNOSIS — R310 Gross hematuria: Secondary | ICD-10-CM | POA: Diagnosis present

## 2020-09-07 MED ORDER — IOHEXOL 300 MG/ML  SOLN
125.0000 mL | Freq: Once | INTRAMUSCULAR | Status: AC | PRN
  Administered 2020-09-07: 125 mL via INTRAVENOUS

## 2020-09-10 ENCOUNTER — Encounter (INDEPENDENT_AMBULATORY_CARE_PROVIDER_SITE_OTHER): Payer: BC Managed Care – PPO | Admitting: Ophthalmology

## 2020-09-14 ENCOUNTER — Encounter: Payer: Self-pay | Admitting: Hematology

## 2020-09-17 NOTE — Telephone Encounter (Signed)
Dr. Erlene Quan please see result notes from CT hematuria on 09/07/20 to see shannon message to pt. Please advise

## 2020-09-26 ENCOUNTER — Ambulatory Visit (INDEPENDENT_AMBULATORY_CARE_PROVIDER_SITE_OTHER): Payer: BC Managed Care – PPO | Admitting: Psychologist

## 2020-09-26 DIAGNOSIS — F411 Generalized anxiety disorder: Secondary | ICD-10-CM

## 2020-09-26 DIAGNOSIS — F331 Major depressive disorder, recurrent, moderate: Secondary | ICD-10-CM | POA: Diagnosis not present

## 2020-10-03 ENCOUNTER — Other Ambulatory Visit: Payer: Self-pay

## 2020-10-03 ENCOUNTER — Ambulatory Visit (INDEPENDENT_AMBULATORY_CARE_PROVIDER_SITE_OTHER): Payer: BC Managed Care – PPO | Admitting: Psychologist

## 2020-10-03 ENCOUNTER — Encounter: Payer: Self-pay | Admitting: Physical Medicine & Rehabilitation

## 2020-10-03 ENCOUNTER — Encounter
Payer: BC Managed Care – PPO | Attending: Physical Medicine & Rehabilitation | Admitting: Physical Medicine & Rehabilitation

## 2020-10-03 VITALS — BP 126/80 | HR 95 | Ht 69.0 in | Wt 213.8 lb

## 2020-10-03 DIAGNOSIS — T402X5A Adverse effect of other opioids, initial encounter: Secondary | ICD-10-CM

## 2020-10-03 DIAGNOSIS — F331 Major depressive disorder, recurrent, moderate: Secondary | ICD-10-CM | POA: Diagnosis not present

## 2020-10-03 DIAGNOSIS — M47816 Spondylosis without myelopathy or radiculopathy, lumbar region: Secondary | ICD-10-CM | POA: Insufficient documentation

## 2020-10-03 DIAGNOSIS — F411 Generalized anxiety disorder: Secondary | ICD-10-CM | POA: Diagnosis not present

## 2020-10-03 DIAGNOSIS — K5903 Drug induced constipation: Secondary | ICD-10-CM

## 2020-10-03 DIAGNOSIS — M5416 Radiculopathy, lumbar region: Secondary | ICD-10-CM | POA: Diagnosis not present

## 2020-10-03 DIAGNOSIS — L4050 Arthropathic psoriasis, unspecified: Secondary | ICD-10-CM | POA: Insufficient documentation

## 2020-10-03 DIAGNOSIS — G609 Hereditary and idiopathic neuropathy, unspecified: Secondary | ICD-10-CM | POA: Insufficient documentation

## 2020-10-03 MED ORDER — MORPHINE SULFATE ER 30 MG PO CP24: ORAL_CAPSULE | ORAL | 0 refills | Status: DC

## 2020-10-03 MED ORDER — MORPHINE SULFATE ER 20 MG PO CP24: ORAL_CAPSULE | ORAL | 0 refills | Status: DC

## 2020-10-03 NOTE — Progress Notes (Signed)
Subjective:    Patient ID: Jesse Bowers, male    DOB: 29-Mar-1965, 55 y.o.   MRN: [id number]  HPI  Jesse Bowers is here in follow up of his chronic pain. He recently developed a kidney stone/hematuria which is being treated.  He continues to deal with low back pain as well with pain radiating down either leg.  We were unable to get his lumbar MRI approved.  He is seeking further appeal through his insurance.  He remains on Opana 50 mg every 12 hours for pain control with his oxycodone 10 mg every 8 hours as needed.  He has upcoming surgery at the end of the month which may require an increase of his breakthrough medication in the short-term.     Pain Inventory Average Pain 7 Pain Right Now 8 My pain is constant, sharp, burning, dull, and stabbing  LOCATION OF PAIN  neck, knees, wrist, fingers, back, groin, right ankle, toes  BOWEL Number of stools per week: 1 Oral laxative use Yes  Type of laxative Oral Enema or suppository use Yes , do not work History of colostomy No  Incontinent No   BLADDER Normal In and out cath, frequency n/a Able to self cath No  Bladder incontinence No  Frequent urination No  Leakage with coughing No  Difficulty starting stream No  Incomplete bladder emptying No    Mobility walk without assistance walk with assistance how many minutes can you walk? 5-10 mins ability to climb steps?  yes do you drive?  yes use a wheelchair transfers alone  Function disabled: date disabled 2013 I need assistance with the following:  bathing, meal prep, and household duties Do you have any goals in this area?  yes  Neuro/Psych bowel control problems weakness numbness tremor trouble walking spasms depression anxiety loss of taste or smell  Prior Studies Any changes since last visit?  yes colonoscopy  Physicians involved in your care Any changes since last visit?  no   Family History  Problem Relation Age of Onset   Stroke Maternal Grandfather         9   Heart attack Maternal Grandfather    Glaucoma Maternal Grandfather    Macular degeneration Maternal Grandfather    Breast cancer Sister    Hypertension Mother    Psoriasis Mother    Other Mother        meningioma developed ~2019   Glaucoma Mother    Cancer Paternal Grandfather    Heart attack Paternal Grandfather    Stroke Paternal Uncle        age 15   Polycythemia Paternal Uncle    Stroke Maternal Grandmother    Congestive Heart Failure Maternal Grandmother    Heart attack Maternal Grandmother    Protein C deficiency Sister 50       Miscarriages   Breast cancer Maternal Aunt 51   Social History   Socioeconomic History   Marital status: Married    Spouse name: Not on file   Number of children: 2   Years of education: 4y college   Highest education level: Not on file  Occupational History   Occupation: Pediatric Nurse practitioner    Comment: 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 Relocated to [place] about 2 months ago from [place], [place]. Patient was in [place] for last 4 years and prior to that in [place]. His wife is working as Scientist, research (physical sciences) for [employer]. Patient is not working and applying for disability. They have 2 kids but no biologic children.    Social Determinants of Health   Financial Resource Strain: Not on file  Food Insecurity: Not on file  Transportation Needs: Not on file  Physical Activity: Not on file  Stress: Not on file  Social  Connections: Not on file   Past Surgical History:  Procedure Laterality Date   ABDOMINAL HYSTERECTOMY Bilateral 1994   TAH, BSO- tranverse incision at 55 yo   ANKLE ARTHROSCOPY WITH RECONSTRUCTION Right 2007   CHOLECYSTECTOMY     laparoscopic   COLONOSCOPY     x3   EYE SURGERY     Left eye 03/02/2018, right 02/15/2018   HIP ARTHROSCOPY W/ LABRAL REPAIR Right 05/11/2013   acetabular labral tear 03/30/2013   KNEE ARTHROPLASTY Right    KNEE JOINT MANIPULATION Left    x3 under anesthesia   KNEE SURGERY Bilateral 1984   Right ACL, left PCL repair   LITHOTRIPSY  2005   LIVER BIOPSY  2013   normal results.   MASTECTOMY Bilateral    prior to 2009   MOUTH SURGERY     NASAL SEPTUM SURGERY N/A 09/20/2015   by ENT Dr. [name]   OVARIAN CYST SURGERY Left    size of grapefruit, was informed that she had shortened vagina   SHOULDER SURGERY Bilateral    Right 08/15/2016, Left 11/15/2016   THUMB ARTHROSCOPY Left    THYROIDECTOMY, PARTIAL Left 2008   TOTAL KNEE ARTHROPLASTY Right 08/23/2018   Procedure: TOTAL KNEE ARTHROPLASTY;  Surgeon: [name], MD;  [facility];  Service: Orthopedics;  Laterality: Right;  110mn   TOTAL KNEE REVISION Left 02/06/2016   Procedure: LEFT TOTAL KNEE REVISION;  Surgeon: FGaynelle Arabian MD;  [facility];  Service: Orthopedics;  Laterality: Left;   TOTAL KNEE REVISION Left 04/22/2017   Procedure: Left knee polyethylene revision;  Surgeon: AGaynelle Arabian MD;  [facility];  Service: Orthopedics;  Laterality: Left;   UPPER GI ENDOSCOPY  2003   Past Medical History:  Diagnosis Date   Abnormal weight loss    Anxiety    Arthritis    Cataract    OU   Celiac disease    Cervical neck pain with evidence of disc disease    patient has a cyst    Chronic constipation    Chronic diastolic heart failure (HCC)    Pt. denies   Chronic pain    Degenerative disc disease at L5-S1 level    with stenosis   DVT (deep venous thrombosis) (HCC)    Right upper  arm, bilateral leg   Eczema    inguinal, feet   Elevated liver enzymes    [place] 04/[place]   Family history of adverse reaction to anesthesia    family has problems with anesthesia of nausea and vomiting    Male-to-male transgender person    GERD (gastroesophageal reflux disease)  History of in 20's   Gluten enteropathy    H/O parotitis    right    Hard of hearing    History of kidney stones    History of retinal tear    Bilateral   History of staph infection    required wound vac   Hx-TIA (transient ischemic attack)    2015   Kidney stones 08/2020   LVH (left ventricular hypertrophy) 12/15/2016   Mild, noted on ECHO   MVP (mitral valve prolapse)    NAFL (nonalcoholic fatty liver)    Neck pain    Neuromuscular disorder (HCC)    bilateral neuropathy feet.   Nuclear sclerotic cataract of both eyes 09/08/2019   Pneumonia 12/17/2010   Polycythemia    Polycythemia, secondary    PONV (postoperative nausea and vomiting)    Protein C deficiency (HCC)    Dr. Anne Fu   Psoriasis    16 X10 cm psoriatic rash on sole of left foot ; open and occ scant bleeding;    psoriatic arthritis    PTSD (post-traumatic stress disorder)    Scaphoid fracture of wrist 09/23/2013   Seizure ([place])    childhood, medication until age 71 then weanned completely off   Sleep apnea    split night study last done by Dr. 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 [name] follows   Syrinx of spinal cord ([place]) 01/06/2014   c spine on MRI   Tachycardia    hx of    Transfusion history    past history- none recent, after surgeries due to blood loss   Wears glasses    Wears hearing aid    BP 126/80   Pulse 95   Ht 5' 9"  (1.753 m)   Wt 213 lb 12.8 oz (97 kg)   SpO2 97%   BMI 31.57 kg/m    Opioid Risk Score:   Fall Risk Score:  `1  Depression screen PHQ 2/9  Depression screen PHQ 2/9 03/21/2020  Decreased Interest 0  Down, Depressed, Hopeless 0  PHQ - 2 Score 0  Some recent data might be hidden     Review of Systems  Gastrointestinal:  Positive for constipation.  Genitourinary:        Groin pain  Musculoskeletal:  Positive for back pain, gait problem and neck pain.  All other systems reviewed and are negative.     Objective:   Physical Exam     General: No acute distress HEENT: NCAT, EOMI, oral membranes moist Cards: reg rate  Chest: normal effort Abdomen: Soft, NT, ND Skin: dry, intact Extremities: no edema Psych: pleasant and appropriate    Neuro: Pt is pleasant and alert and oriented x 3.   Strength is grossly 3-4/5 based on pain, splint.   reflexies are hyperactive. patchy sensory loss in legs..  Musculoskeletal:  Low back tender with palpation and ROM. +SST bilaterally. + SLR bilaterally 1+ DTR's      Assessment & Plan:   1. Psoriatic arthritis with pain in multiple areas, most prominently feet, hands, elbows. His pain is also related to his prior CVA and associated motor/sensory change   2. 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 3. Hx of left total knee revision again on 04/21/17 per [facility] and right TKA 08/2018 4. Chronic  low back pain---MRI with severe DDD at L5-S1. Left S1 radiculopathy? S/p LEFT L5-S1 ESI        -6/1 now with increased radicular sx in bilateral legs, sx slowly worsening over the last year 5. Protein C deficiency   6. Left shoulder subluxation, bicipital tendonitis 7. Central sleep apnea 8. Polycythemia 9. Depression with anxiety.  10. Left lateral epidondylitis  11. 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  left sided weakness can fluctuate. Most recent cervical MRI (07/08/17) without significant  change 12. 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 [place] 14. Right wrist psoriatic arthritis, tendonapthy, ganglion cysts s/p multiple recent surgeries.      Plan:  1. Continue effexor 369m xr daily  2. right thumb jt replacement surgery by ortho next month. 3. Voltaren gel to hands, feet, knees, elbows--- continue 4. Continue baclofen for lower extermity spasms/back pain. 20 mg 4 times daily #120.  5. Since MTX has been increased will reduce  kadian from 628mto 5056m12 #60 and oxycodone 28m42m0 back to baseline We will continue the controlled substance monitoring program, this consists of regular clinic visits, examinations, routine drug screening, pill counts as well as use of [place].   Medication was refilled and a second prescription was sent to the patient's pharmacy for next month.   -will need more medication potentially post-op       6.   for plastic-urological procedure later this month          7. Has had good results previously with left L5-S1 translaminar injection per Dr. KirsLetta Pateven new leg sx and the fact that his last scan was 4 years ago, I have ordered ANOTHER MRI of lumbar spine.        -consider f/u ESI 8: OI constipation             -amitiza   Fifteen minutes of face to face patient care time were spent during this visit. All questions were encouraged and answered.  Follow up with me in 2 mos .

## 2020-10-03 NOTE — Telephone Encounter (Signed)
Per [name] :  [facility] on [address] will need his Morphine 30 MG sent in STAT.  Before his appointment on today. So it can order in a timely manner.  To prevent him from being out of medication.  He will need the Morphine 20 MG sent in also.  Thank you.

## 2020-10-03 NOTE — Patient Instructions (Signed)
PLEASE FEEL FREE TO CALL OUR OFFICE WITH ANY PROBLEMS OR QUESTIONS ([phone])      

## 2020-10-05 MED ORDER — OXYCODONE HCL 10 MG PO TABS: 10.0000 mg | ORAL_TABLET | ORAL | 0 refills | Status: DC | PRN

## 2020-10-05 NOTE — Addendum Note (Signed)
Addended by: Alger Simons T on: 10/05/2020 01:06 PM   Modules accepted: Orders

## 2020-10-12 ENCOUNTER — Ambulatory Visit (HOSPITAL_COMMUNITY)
Admission: RE | Admit: 2020-10-12 | Discharge: 2020-10-12 | Disposition: A | Discharge status: Home / Self Care | Payer: BC Managed Care – PPO | Source: Ambulatory Visit | Attending: Physical Medicine & Rehabilitation | Admitting: Physical Medicine & Rehabilitation

## 2020-10-12 ENCOUNTER — Other Ambulatory Visit: Payer: Self-pay

## 2020-10-12 DIAGNOSIS — M5416 Radiculopathy, lumbar region: Secondary | ICD-10-CM | POA: Insufficient documentation

## 2020-10-12 DIAGNOSIS — M47816 Spondylosis without myelopathy or radiculopathy, lumbar region: Secondary | ICD-10-CM | POA: Insufficient documentation

## 2020-10-16 ENCOUNTER — Other Ambulatory Visit: Payer: Self-pay | Admitting: Physical Medicine & Rehabilitation

## 2020-10-16 DIAGNOSIS — R252 Cramp and spasm: Secondary | ICD-10-CM

## 2020-10-17 ENCOUNTER — Ambulatory Visit (INDEPENDENT_AMBULATORY_CARE_PROVIDER_SITE_OTHER): Payer: BC Managed Care – PPO | Admitting: Psychologist

## 2020-10-17 DIAGNOSIS — F411 Generalized anxiety disorder: Secondary | ICD-10-CM

## 2020-10-17 DIAGNOSIS — F331 Major depressive disorder, recurrent, moderate: Secondary | ICD-10-CM

## 2020-10-18 NOTE — Telephone Encounter (Signed)
I talked to Jesse Bowers today about his MRI. He agrees to an L5-S1 translaminar ESI. WIll you set up with Dr. Letta Pate?  Thanks!

## 2020-10-19 ENCOUNTER — Ambulatory Visit
Admission: EM | Admit: 2020-10-19 | Discharge: 2020-10-19 | Disposition: A | Discharge status: Home / Self Care | Payer: BC Managed Care – PPO | Attending: Urgent Care | Admitting: Urgent Care

## 2020-10-19 ENCOUNTER — Other Ambulatory Visit: Payer: Self-pay

## 2020-10-19 ENCOUNTER — Encounter: Payer: Self-pay | Admitting: Emergency Medicine

## 2020-10-19 DIAGNOSIS — L409 Psoriasis, unspecified: Secondary | ICD-10-CM | POA: Diagnosis not present

## 2020-10-19 DIAGNOSIS — H60393 Other infective otitis externa, bilateral: Secondary | ICD-10-CM

## 2020-10-19 MED ORDER — PREDNISONE 20 MG PO TABS: ORAL_TABLET | ORAL | 0 refills | Status: DC

## 2020-10-19 NOTE — ED Provider Notes (Addendum)
[place]   MRN: [id number] DOB: [birth year]  Subjective:   Jesse Bowers is a 55 y.o. male presenting for 1 day history of acute onset recurrent bilateral ear swelling, irritation and drainage.  Patient has longstanding history of chronic otitis externa and psoriasis.  Has been seen by his regular doctor in the ENT specialist multiple times for this.  Reports that he has failed multiple antibiotic eardrops including Cortisporin HC, Ciprodex, acetic acid drops.  He is typically responded better with short steroid courses.  His ENT has previously avoided the use of oral antibiotics as he has never had any type of middle ear or inner ear infection.  He does report that he is currently ear pain today on the left side is a bit deeper and also radiates down into his neck behind his ear.  Denies fever, frank drainage from his ear, tinnitus.  Unfortunately his ENT office is closed today, was unable to see his PCP either.  No current facility-administered medications for this encounter.  Current Outpatient Medications:    acetic acid-hydrocortisone (VOSOL-HC) OTIC solution, hydrocortisone-acetic acid 1 %-2 % ear drops, Disp: , Rfl:    acyclovir ointment (ZOVIRAX) 5 %, as needed., Disp: , Rfl:    baclofen (LIORESAL) 20 MG tablet, TAKE 1 TABLET BY MOUTH FOUR TIMES A DAY, Disp: 120 tablet, Rfl: 3   Calcipotriene-Betameth Diprop 0.005-0.064 % FOAM, Apply 1 application topically 2 (two) times daily as needed. , Disp: , Rfl:    clorazepate (TRANXENE) 15 MG tablet, Take 15 mg by mouth at bedtime., Disp: , Rfl:    clorazepate (TRANXENE) 7.5 MG tablet, Take 7.5 mg by mouth 2 (two) times daily., Disp: , Rfl:    COSENTYX SENSOREADY, 300 MG, 150 MG/ML SOAJ, Inject 2 Syringes into the skin every 28 (twenty-eight) days., Disp: , Rfl:    Cyanocobalamin (VITAMIN B-12) 5000 MCG SUBL, Place 5,000 mcg under the tongue daily., Disp: , Rfl:    desonide (DESOWEN) 0.05 % cream, Apply topically 2 (two) times  daily., Disp: , Rfl:    desonide (DESOWEN) 0.05 % ointment, Apply 1 application topically 2 (two) times daily as needed (psoriasis). , Disp: , Rfl: 2   ergocalciferol (VITAMIN D2) 1.25 MG (50000 UT) capsule, Take 1 capsule by mouth once a week., Disp: , Rfl:    Eszopiclone 3 MG TABS, Take 3 mg by mouth at bedtime. Take immediately before bedtime, Disp: , Rfl:    finasteride (PROPECIA) 1 MG tablet, Take 1 mg by mouth daily., Disp: , Rfl:    Fluocinolone Acetonide 0.01 % OIL, Place 3 drops into both ears 2 (two) times daily as needed for itching., Disp: , Rfl:    folic acid (FOLVITE) 1 MG tablet, Take 1 mg by mouth daily., Disp: , Rfl:    furosemide (LASIX) 20 MG tablet, TAKE 1 TABLET BY MOUTH DAILY. TO BE TAKEN WITH THE 40 MG TO EQUAL 60 MG DAILY, Disp: 30 tablet, Rfl: 8   furosemide (LASIX) 40 MG tablet, TAKE 1 TABLET BY MOUTH DAILY. TO BE TAKEN WITH THE 20 MG TABLET TO EQUAL 60 MG DAILY., Disp: 30 tablet, Rfl: 8   halobetasol (ULTRAVATE) 0.05 % cream, Apply topically., Disp: , Rfl:    Lifitegrast (XIIDRA) 5 % SOLN, Place 1-2 drops into both eyes 2 times daily., Disp: , Rfl:    lubiprostone (AMITIZA) 24 MCG capsule, Take 24 mcg by mouth 2 (two) times daily with a meal., Disp: , Rfl:    methotrexate (RHEUMATREX) 2.5 MG  tablet, Take 15 mg by mouth once a week. Caution:Chemotherapy. Protect from light., Disp: , Rfl:    morphine (KADIAN) 20 MG 24 hr capsule, SMARTSIG:1 Capsule(s) By Mouth Morning-Night, Disp: 60 capsule, Rfl: 0   morphine (KADIAN) 30 MG 24 hr capsule, SMARTSIG:1 Capsule(s) By Mouth Morning-Night, Disp: 60 capsule, Rfl: 0   neomycin-polymyxin-hydrocortisone (CORTISPORIN) 3.5-10000-1 OTIC suspension, Place 4 drops into both ears 2 (two) times daily as needed (ear pain). , Disp: , Rfl: 1   Oxycodone HCl 10 MG TABS, Take 1 tablet (10 mg total) by mouth every 4 (four) hours as needed., Disp: 85 tablet, Rfl: 0   potassium chloride (KLOR-CON) 10 MEQ tablet, TAKE 2 TABLETS BY MOUTH EVERY DAY,  Disp: 180 tablet, Rfl: 3   rivaroxaban (XARELTO) 20 MG TABS tablet, TAKE 1 TABLET(20 MG) BY MOUTH EVERY NIGHT AT BEDTIME, Disp: 90 tablet, Rfl: 3   testosterone cypionate (DEPOTESTOSTERONE CYPIONATE) 200 MG/ML injection, INJECT 0.5 MLS (100 MG TOTAL) INTO THE MUSCLE ONCE A WEEK., Disp: 10 mL, Rfl: 0   ursodiol (ACTIGALL) 250 MG tablet, Take 250 mg by mouth 2 (two) times daily with a meal. , Disp: , Rfl:    ursodiol (ACTIGALL) 500 MG tablet, Take 1 tablet midday with lunch and 1 tablet in the evening with dinneer, Disp: , Rfl:    venlafaxine XR (EFFEXOR-XR) 150 MG 24 hr capsule, Take 2 capsules (300 mg total) by mouth daily with breakfast., Disp: 60 capsule, Rfl: 5   Allergies  Allergen Reactions   Penicillin G Anaphylaxis and Other (See Comments)    Has patient had a PCN reaction causing immediate rash, facial/tongue/throat swelling, SOB or lightheadedness with hypotension: Yes Has patient had a PCN reaction causing severe rash involving mucus membranes or skin necrosis: No Has patient had a PCN reaction that required hospitalization Yes Has patient had a PCN reaction occurring within the last 10 years: No If all of the above answers are "NO", then may proceed with Cephalosporin use.    Sulfa Antibiotics Rash    Stevens-Johnson rash   Tegaderm Ag Mesh [Silver] Dermatitis    Causes blistering wounds    Ibuprofen Other (See Comments)    Contraindicated with Xarelto.    Pregabalin Other (See Comments)    Ineffective   Sulfacetamide Sodium-Sulfur Rash    Past Medical History:  Diagnosis Date   Abnormal weight loss    Anxiety    Arthritis    Cataract    OU   Celiac disease    Cervical neck pain with evidence of disc disease    patient has a cyst    Chronic constipation    Chronic diastolic heart failure (HCC)    Pt. 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   Family history of adverse reaction to anesthesia    family has problems with anesthesia of nausea and vomiting    Male-to-male transgender person    GERD (gastroesophageal reflux disease)    History of in 20's   Gluten enteropathy    H/O parotitis    right    Hard of hearing    History of kidney stones    History of retinal tear    Bilateral   History  of staph infection    required wound vac   Hx-TIA (transient ischemic attack)    2015   Kidney stones 08/2020   LVH (left ventricular hypertrophy) 12/15/2016   Mild, noted on ECHO   MVP (mitral valve prolapse)    NAFL (nonalcoholic fatty liver)    Neck pain    Neuromuscular disorder (HCC)    bilateral neuropathy feet.   Nuclear sclerotic cataract of both eyes 09/08/2019   Pneumonia 12/17/2010   Polycythemia    Polycythemia, secondary    PONV (postoperative nausea and vomiting)    Protein C deficiency (HCC)    Dr. Anne Fu   Psoriasis    16 X10 cm psoriatic rash on sole of left foot ; open and occ scant bleeding;    psoriatic arthritis    PTSD (post-traumatic stress disorder)    Scaphoid fracture of wrist 09/23/2013   Seizure ([place])    childhood, medication until age 69 then weanned completely off   Sleep apnea    split night study last done by Dr. 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   c spine on MRI   Tachycardia    hx of    Transfusion history    past history- none recent, after surgeries due to blood loss   Wears glasses    Wears hearing aid      Past Surgical History:  Procedure Laterality Date   ABDOMINAL HYSTERECTOMY Bilateral 1994   TAH, BSO- tranverse incision at 55 yo    ANKLE ARTHROSCOPY WITH RECONSTRUCTION Right 2007   CHOLECYSTECTOMY     laparoscopic   COLONOSCOPY     x3   EYE SURGERY     Left eye 03/02/2018, right 02/15/2018   HIP ARTHROSCOPY W/ LABRAL REPAIR Right 05/11/2013   acetabular labral tear 03/30/2013   KNEE ARTHROPLASTY Right    KNEE JOINT MANIPULATION Left    x3 under anesthesia   KNEE SURGERY Bilateral 1984   Right ACL, left PCL repair   LITHOTRIPSY  2005   LIVER BIOPSY  2013   normal results.   MASTECTOMY Bilateral    prior to 2009   MOUTH SURGERY     NASAL SEPTUM SURGERY N/A 09/20/2015   by ENT Dr. [name]   OVARIAN CYST SURGERY Left    size of grapefruit, was informed that she had shortened vagina   SHOULDER SURGERY Bilateral    Right 08/15/2016, Left 11/15/2016   THUMB ARTHROSCOPY Left    THYROIDECTOMY, PARTIAL Left 2008   TOTAL KNEE ARTHROPLASTY Right 08/23/2018   Procedure: TOTAL KNEE ARTHROPLASTY;  Surgeon: [name], MD;  Location: [facility];  Service: Orthopedics;  Laterality: Right;  73mn   TOTAL KNEE REVISION Left 02/06/2016   Procedure: LEFT TOTAL KNEE REVISION;  Surgeon: FGaynelle Arabian MD;  Location: [facility];  Service: Orthopedics;  Laterality: Left;   TOTAL KNEE REVISION Left 04/22/2017   Procedure: Left knee polyethylene revision;  Surgeon: AGaynelle Arabian MD;  Location: [facility];  Service: Orthopedics;  Laterality: Left;   UPPER GI ENDOSCOPY  2003    Family History  Problem Relation Age of Onset   Stroke Maternal Grandfather        565  Heart attack Maternal Grandfather  Glaucoma Maternal Grandfather    Macular degeneration Maternal Grandfather    Breast cancer Sister    Hypertension Mother    Psoriasis Mother    Other Mother        meningioma developed ~2019   Glaucoma Mother    Cancer Paternal Grandfather    Heart attack Paternal Grandfather    Stroke Paternal Uncle        age 25   Polycythemia Paternal Uncle    Stroke Maternal Grandmother    Congestive Heart Failure Maternal Grandmother     Heart attack Maternal Grandmother    Protein C deficiency Sister 49       Miscarriages   Breast cancer Maternal Aunt 35    Social History   Tobacco Use   Smoking status: Never   Smokeless tobacco: Never  Vaping Use   Vaping Use: Never used  Substance Use Topics   Alcohol use: Yes    Comment: social   Drug use: No    ROS   Objective:   Vitals: BP 112/81 (BP Location: Right Arm)   Pulse 84   Temp 98.1 F (36.7 C) (Oral)   Resp 16   SpO2 97%   Physical Exam Constitutional:      General: He is not in acute distress.    Appearance: Normal appearance. He is well-developed and normal weight. He is not ill-appearing, toxic-appearing or diaphoretic.  HENT:     Head: Normocephalic and atraumatic.     Right Ear: Tympanic membrane normal.     Left Ear: Tympanic membrane normal.     Ears:     Comments: Trace swelling of the right external ear canal with erythema and slight clumpy drainage.  1+ swelling of the left external ear canal with erythema and minimal drainage.  No tenderness about the mastoid process, erythema or swelling.    Nose: Nose normal.     Mouth/Throat:     Pharynx: Oropharynx is clear.  Eyes:     General: No scleral icterus.       Right eye: No discharge.        Left eye: No discharge.     Extraocular Movements: Extraocular movements intact.     Pupils: Pupils are equal, round, and reactive to light.  Cardiovascular:     Rate and Rhythm: Normal rate.  Pulmonary:     Effort: Pulmonary effort is normal.  Musculoskeletal:     Cervical back: Normal range of motion.  Neurological:     Mental Status: He is alert and oriented to person, place, and time.  Psychiatric:        Mood and Affect: Mood normal.        Behavior: Behavior normal.        Thought Content: Thought content normal.        Judgment: Judgment normal.     Assessment and Plan :   PDMP not reviewed this encounter.  1. Psoriasis   2. Infective otitis externa of both ears     Had  extensive discussion with patient about treatment options.  Ultimately we decided to use an oral prednisone course in light of his psoriasis and history of responding better to this.  Patient has multiple antibiotic eardrops that he can use at home.  He prefers to avoid any oral antibiotics as his ENT is not recommended at this.  No signs of severe infection, mastoiditis.  Recommended close follow-up with his ENT specialist, PCP. Counseled patient on potential for adverse effects  with medications prescribed/recommended today, ER and return-to-clinic precautions discussed, patient verbalized understanding.    Jaynee Eagles, PA-C 10/19/20 1918    Jaynee Eagles, PA-C 05/09/22 1218

## 2020-10-19 NOTE — ED Triage Notes (Signed)
Hx of chronic swimmers ear and psoriasis inside ear. Yesterday took a nap and woke up not being able to hear from left ear at all. Used an ear curette and drops with no improvement. Followed by ENT for this problem. States they have tried multiple things for this, and states nothing works well. Asking about steroids for treatment. Bilateral external ear canals appear swollen.

## 2020-10-20 ENCOUNTER — Ambulatory Visit (HOSPITAL_COMMUNITY): Payer: BC Managed Care – PPO

## 2020-10-22 ENCOUNTER — Ambulatory Visit (INDEPENDENT_AMBULATORY_CARE_PROVIDER_SITE_OTHER): Payer: BC Managed Care – PPO | Admitting: Psychologist

## 2020-10-22 DIAGNOSIS — F411 Generalized anxiety disorder: Secondary | ICD-10-CM

## 2020-10-22 DIAGNOSIS — F331 Major depressive disorder, recurrent, moderate: Secondary | ICD-10-CM | POA: Diagnosis not present

## 2020-10-25 ENCOUNTER — Telehealth: Payer: Self-pay | Admitting: Cardiovascular Disease

## 2020-10-25 ENCOUNTER — Encounter (HOSPITAL_BASED_OUTPATIENT_CLINIC_OR_DEPARTMENT_OTHER): Payer: Self-pay | Admitting: Family

## 2020-10-25 ENCOUNTER — Other Ambulatory Visit: Payer: Self-pay

## 2020-10-25 ENCOUNTER — Ambulatory Visit (INDEPENDENT_AMBULATORY_CARE_PROVIDER_SITE_OTHER): Payer: BC Managed Care – PPO | Admitting: Family

## 2020-10-25 VITALS — BP 116/70 | HR 94 | Ht 69.0 in | Wt 216.4 lb

## 2020-10-25 DIAGNOSIS — G9519 Other vascular myelopathies: Secondary | ICD-10-CM

## 2020-10-25 DIAGNOSIS — G4733 Obstructive sleep apnea (adult) (pediatric): Secondary | ICD-10-CM | POA: Diagnosis not present

## 2020-10-25 DIAGNOSIS — D6859 Other primary thrombophilia: Secondary | ICD-10-CM | POA: Diagnosis not present

## 2020-10-25 DIAGNOSIS — Z7901 Long term (current) use of anticoagulants: Secondary | ICD-10-CM | POA: Diagnosis not present

## 2020-10-25 DIAGNOSIS — I5032 Chronic diastolic (congestive) heart failure: Secondary | ICD-10-CM

## 2020-10-25 MED ORDER — POTASSIUM CHLORIDE ER 10 MEQ PO TBCR: EXTENDED_RELEASE_TABLET | ORAL | 3 refills | Status: DC

## 2020-10-25 MED ORDER — FUROSEMIDE 20 MG PO TABS: 20.0000 mg | ORAL_TABLET | Freq: Every day | ORAL | 2 refills | Status: DC

## 2020-10-25 MED ORDER — FUROSEMIDE 40 MG PO TABS: 40.0000 mg | ORAL_TABLET | Freq: Every day | ORAL | 2 refills | Status: DC

## 2020-10-25 NOTE — Progress Notes (Addendum)
Office Visit    Patient Name: Jesse Bowers Date of Encounter: 10/25/2020  PCP:  Shawnee Knapp, MD   [place]  Cardiologist:  Sanda Klein, MD  Advanced Practice Provider:  No care team member to display Electrophysiologist:  None    Chief Complaint    Jesse Bowers is a 55 y.o. 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 He is a transgender male to male on chronic andogren therapy (transition began around the age of 63).   Past Medical History    Past Medical History:  Diagnosis Date   Abnormal weight loss    Anxiety    Arthritis    Cataract    OU   Celiac disease    Cervical neck pain with evidence of disc disease    patient has a cyst    Chronic constipation    Chronic diastolic heart failure (HCC)    Pt. 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   Family history of adverse reaction to anesthesia    family has problems with anesthesia of nausea and vomiting    Male-to-male transgender person    GERD (gastroesophageal reflux disease)    History of in 20's   Gluten enteropathy    H/O parotitis    right    Hard of hearing    History of kidney stones    History of retinal tear    Bilateral   History of staph infection    required wound vac   Hx-TIA (transient ischemic attack)    2015   Kidney stones 08/2020   LVH (left ventricular hypertrophy) 12/15/2016   Mild, noted on ECHO   MVP (mitral valve prolapse)    NAFL (nonalcoholic fatty liver)    Neck pain    Neuromuscular disorder ([place])    bilateral neuropathy feet.   Nuclear sclerotic cataract of both eyes 09/08/2019    Pneumonia 12/17/2010   Polycythemia    Polycythemia, secondary    PONV (postoperative nausea and vomiting)    Protein C deficiency (HCC)    Dr. Anne Fu   Psoriasis    16 X10 cm psoriatic rash on sole of left foot ; open and occ scant bleeding;    psoriatic arthritis    PTSD (post-traumatic stress disorder)    Scaphoid fracture of wrist 09/23/2013   Seizure ([place])    childhood, medication until age 54 then weanned completely off   Sleep apnea    split night study last done by Dr. [name] 06/18/15 shows severe OSA, CSA, and hypersomnia, rec bipap   Splenomegaly    Stenosis of ureteropelvic junction (UPJ)    left   Stroke [facility])    CVA vs TIA in left cerebrum causing slight right sided weakness-Dr. [name] follows   Syrinx of spinal cord ([place]) 01/06/2014   c spine on MRI   Tachycardia    hx of    Transfusion history    past history- none recent, after surgeries due to blood loss   Wears glasses    Wears hearing aid  Past Surgical History:  Procedure Laterality Date   ABDOMINAL HYSTERECTOMY Bilateral 1994   TAH, BSO- tranverse incision at 55 yo   ANKLE ARTHROSCOPY WITH RECONSTRUCTION Right 2007   CHOLECYSTECTOMY     laparoscopic   COLONOSCOPY     x3   EYE SURGERY     Left eye 03/02/2018, right 02/15/2018   HIP ARTHROSCOPY W/ LABRAL REPAIR Right 05/11/2013   acetabular labral tear 03/30/2013   KNEE ARTHROPLASTY Right    KNEE JOINT MANIPULATION Left    x3 under anesthesia   KNEE SURGERY Bilateral 1984   Right ACL, left PCL repair   LITHOTRIPSY  2005   LIVER BIOPSY  2013   normal results.   MASTECTOMY Bilateral    prior to 2009   MOUTH SURGERY     NASAL SEPTUM SURGERY N/A 09/20/2015   by ENT Dr. [name]   OVARIAN CYST SURGERY Left    size of grapefruit, was informed that she had shortened vagina   SHOULDER SURGERY Bilateral    Right 08/15/2016, Left 11/15/2016   THUMB ARTHROSCOPY Left    THYROIDECTOMY, PARTIAL Left 2008   TOTAL KNEE ARTHROPLASTY Right 08/23/2018    Procedure: TOTAL KNEE ARTHROPLASTY;  Surgeon: [name], MD;  Location: [facility];  Service: Orthopedics;  Laterality: Right;  23mn   TOTAL KNEE REVISION Left 02/06/2016   Procedure: LEFT TOTAL KNEE REVISION;  Surgeon: FGaynelle Arabian MD;  Location: [facility];  Service: Orthopedics;  Laterality: Left;   TOTAL KNEE REVISION Left 04/22/2017   Procedure: Left knee polyethylene revision;  Surgeon: AGaynelle Arabian MD;  Location: [facility];  Service: Orthopedics;  Laterality: Left;   UPPER GI ENDOSCOPY  2003    Allergies  Allergies  Allergen Reactions   Penicillin G Anaphylaxis and Other (See Comments)    Has patient had a PCN reaction causing immediate rash, facial/tongue/throat swelling, SOB or lightheadedness with hypotension: Yes Has patient had a PCN reaction causing severe rash involving mucus membranes or skin necrosis: No Has patient had a PCN reaction that required hospitalization Yes Has patient had a PCN reaction occurring within the last 10 years: No If all of the above answers are "NO", then may proceed with Cephalosporin use.    Sulfa Antibiotics Rash    Stevens-Johnson rash   Tegaderm Ag Mesh [Silver] Dermatitis    Causes blistering wounds    Ibuprofen Other (See Comments)    Contraindicated with Xarelto.    Pregabalin Other (See Comments)    Ineffective   Sulfacetamide Sodium-Sulfur Rash    History of Present Illness    Jesse Kawanois a 55y.o. 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 He is a transgender male to male on chronic andogren therapy (transition began around the age of 89.  He was last seen 08/29/20 by Dr. CSallyanne Kuster  Echo 04/27/20 LVEF ?60%, GLD -258% grade 1 diastolic dysfunction. He had a brief period of volume excess after thumb surgery 05/2020. When seen 08/29/20 he noted he was planning gender affirming  [facility] As he was doing well from cardiac perspective he was provided clearance for surgery and given permission to hold anticoagulation.   He presents today for follow up. Tells me he weighed yesterday 209lbs on home scale then today weight of 216 lbs. Noted swelling in his hands and a sock line  that has developed throughout the day today. Did start prednisone last Saturday which was started for psoriasis flare. He does note he is going to the restroom more throughout the day since the fluid retention. He drinks vitamin water with his Miralax, water, and one Coke Zero in the morning. Tells me he does no more than 3L during hot weather and no more than 2L throughout the day. Drinks 4-5 of his 22 oz bottles of water - estimates close to 2L over the last few days. Last night he woke up coughing over his BIPAP. BP at home recently 116/93, 112/97 over the last 3 days. No lightehadedness, dizziness, near syncope, syncope.  10/23/20 labs K 4.8, creatinine 1.08, GFR 81  EKGs/Labs/Other Studies Reviewed:   The following studies were reviewed today:   EKG:  No EKG today.  Recent Labs: 08/24/2020: ALT 29; BUN 16; Creatinine, Ser 1.04; Hemoglobin 15.0; Platelet Count 178; Potassium 4.7; Sodium 138  Recent Lipid Panel    Component Value Date/Time   CHOL 142 01/05/2018 1509   TRIG 45 01/05/2018 1509   HDL 52 01/05/2018 1509   CHOLHDL 2.7 01/05/2018 1509   LDLCALC 81 01/05/2018 1509    Home Medications   Current Meds  Medication Sig   acetic acid-hydrocortisone (VOSOL-HC) OTIC solution hydrocortisone-acetic acid 1 %-2 % ear drops   acyclovir ointment (ZOVIRAX) 5 % as needed.   baclofen (LIORESAL) 20 MG tablet TAKE 1 TABLET BY MOUTH FOUR TIMES A DAY   Calcipotriene-Betameth Diprop 0.005-0.064 % FOAM Apply 1 application topically 2 (two) times daily as needed.    Clobetasol Propionate Emulsion 0.05 % topical foam daily.   clorazepate (TRANXENE) 15  MG tablet Take 15 mg by mouth at bedtime.   clorazepate (TRANXENE) 7.5 MG tablet Take 7.5 mg by mouth 2 (two) times daily.   COSENTYX SENSOREADY, 300 MG, 150 MG/ML SOAJ Inject 2 Syringes into the skin every 28 (twenty-eight) days.   Cyanocobalamin (VITAMIN B-12) 5000 MCG SUBL Place 5,000 mcg under the tongue daily.   ergocalciferol (VITAMIN D2) 1.25 MG (50000 UT) capsule Take 1 capsule by mouth once a week.   Eszopiclone 3 MG TABS Take 3 mg by mouth at bedtime. Take immediately before bedtime   Fluocinolone Acetonide 0.01 % OIL Place 3 drops into both ears 2 (two) times daily as needed for itching.   folic acid (FOLVITE) 1 MG tablet Take 1 mg by mouth daily.   furosemide (LASIX) 20 MG tablet TAKE 1 TABLET BY MOUTH DAILY. TO BE TAKEN WITH THE 40 MG TO EQUAL 60 MG DAILY   furosemide (LASIX) 40 MG tablet TAKE 1 TABLET BY MOUTH DAILY. TO BE TAKEN WITH THE 20 MG TABLET TO EQUAL 60 MG DAILY.   halobetasol (ULTRAVATE) 0.05 % cream Apply topically.   Lifitegrast (XIIDRA) 5 % SOLN Place 1-2 drops into both eyes 2 times daily.   linaclotide (LINZESS) 290 MCG CAPS capsule Take by mouth daily.   methotrexate (RHEUMATREX) 2.5 MG tablet Take 15 mg by mouth once a week. Caution:Chemotherapy. Protect from light.   morphine (KADIAN) 20 MG 24 hr capsule SMARTSIG:1 Capsule(s) By Mouth Morning-Night   morphine (KADIAN) 30 MG 24 hr capsule SMARTSIG:1 Capsule(s) By Mouth Morning-Night   neomycin-polymyxin-hydrocortisone (CORTISPORIN) 3.5-10000-1 OTIC suspension Place 4 drops into both ears 2 (two) times daily as needed (ear pain).    Oxycodone HCl 10 MG TABS Take 1 tablet (10 mg total) by mouth every 4 (four) hours as needed. (Patient taking differently: Take 10 mg  by mouth every 8 (eight) hours as needed.)   polyethylene glycol (MIRALAX / GLYCOLAX) 17 g packet Take 17 g by mouth daily. 2 capfuls daily   potassium chloride (KLOR-CON) 10 MEQ tablet TAKE 2 TABLETS BY MOUTH EVERY DAY   predniSONE (DELTASONE) 20 MG tablet  Day 1-5: Take 2 tablets daily. Day 6-10: Take 1 tablet daily. Take tablets with breakfast.   rivaroxaban (XARELTO) 20 MG TABS tablet TAKE 1 TABLET(20 MG) BY MOUTH EVERY NIGHT AT BEDTIME   testosterone cypionate (DEPOTESTOSTERONE CYPIONATE) 200 MG/ML injection INJECT 0.5 MLS (100 MG TOTAL) INTO THE MUSCLE ONCE A WEEK.   ursodiol (ACTIGALL) 250 MG tablet Take 250 mg by mouth 2 (two) times daily with a meal.    ursodiol (ACTIGALL) 500 MG tablet Take 1 tablet midday with lunch and 1 tablet in the evening with dinneer   venlafaxine XR (EFFEXOR-XR) 150 MG 24 hr capsule Take 150 mg by mouth daily with breakfast.   venlafaxine XR (EFFEXOR-XR) 75 MG 24 hr capsule Take 75 mg by mouth daily with breakfast.   [DISCONTINUED] desonide (DESOWEN) 0.05 % ointment Apply 1 application topically 2 (two) times daily as needed (psoriasis).    [DISCONTINUED] finasteride (PROPECIA) 1 MG tablet Take 1 mg by mouth daily.   [DISCONTINUED] lubiprostone (AMITIZA) 24 MCG capsule Take 24 mcg by mouth 2 (two) times daily with a meal.   [DISCONTINUED] venlafaxine XR (EFFEXOR-XR) 150 MG 24 hr capsule Take 2 capsules (300 mg total) by mouth daily with breakfast. (Patient taking differently: Take 150 mg by mouth daily with breakfast.)     Review of Systems      All other systems reviewed and are otherwise negative except as noted above.  Physical Exam    VS:  BP 116/70   Pulse 94   Ht 5' 9"  (1.753 m)   Wt 216 lb 6.4 oz (98.2 kg)   SpO2 98%   BMI 31.96 kg/m  , BMI Body mass index is 31.96 kg/m.  Wt Readings from Last 3 Encounters:  10/25/20 216 lb 6.4 oz (98.2 kg)  10/03/20 213 lb 12.8 oz (97 kg)  08/21/20 220 lb (99.8 kg)     GEN: Well nourished, well developed, in no acute distress. HEENT: normal. Neck: Supple, no JVD, carotid bruits, or masses. Cardiac: RRR, no murmurs, rubs, or gallops. No clubbing, cyanosis. Bilateral 1+ pitting edema.  Radials/PT 2+ and equal bilaterally.  Respiratory:  Respirations regular  and unlabored, clear to auscultation bilaterally. GI: Soft, nontender, nondistended. MS: No deformity or atrophy. Skin: Warm and dry, no rash. Neuro:  Strength and sensation are intact. Psych: Normal affect.  Assessment & Plan    HFpEF / LE edema / HFpEF - In setting of prednisone use. Likely culprit of fluid retention. Has 4 more days ot finish course. Compliant with low salt diet and <2L fluid intake. Weight gain of 7 pounds overnight with LE edema and dyspnea. We will double Lasix to 16m BID for 3 days. Take Potassium 232m in the AM and 1079min the PM for 3 days. On Monday he will  provide us [place] update of weight and return to Lasix 45m25m and Potassium 20mE15m. BMP in 1 week.  Neurogenic claudication - normal ABI 11/2018. No worsening claudication.   OSA - Continue BIPAP compliance.  Protein C deficiency / Chronic anticoagulation - History of CVA. No history of atrial fibrillation. On chronic anticoagulation with XaretSheila Oatsies bleeding complications.   Disposition: Follow up  05/2021  with  Dr. [name]  Signed, Loel Dubonnet, NP 10/25/2020, 3:19 PM [place]

## 2020-10-25 NOTE — Telephone Encounter (Signed)
Patient of Dr. [name] walked in the office today w/complaints of acute weight gain, chest tightness, shortness of breath, hand and ankle edema. He reports weight gain of 209lbs - 216lbs over night. He has pitting edema bilaterally in LE. He is currently on prednisone, does not report dietary changes/indiscretions. No appointments available at [place] office today but scheduled to see C. Walker NP today @ 3pm at [employer]

## 2020-10-25 NOTE — Patient Instructions (Signed)
Medication Instructions:   Your physician has recommended you make the following change in your medication:    Friday, Saturday, Sunday Take Furosemide (Lasix) to 63m twice per day Take Potassium 236m in the morning and an additional 1034min the afternoon  Monday Return to Furosemide (Lasix) to 64m47mily Return to Potassium 20mE33m the morning  *If you need a refill on your cardiac medications before your next appointment, please call your pharmacy*   Lab Work: Your physician recommends that you return for lab work in 1 weeks for BMP, CBC [facility]. You do not need to be fasting. Simply stop by at your convenience between 8am and 4pm. They are closed for lunch from 12:45pm-1:45pm  If you have labs (blood work) drawn today and your tests are completely normal, you will receive your results only by: MyChaTupeloyou have MyChart) OR A paper copy in the mail If you have any lab test that is abnormal or we need to change your treatment, we will call you to review the results.  Testing/Procedures: None ordered today.   Follow-Up: At [facility] and your health needs are our priority.  As part of our continuing mission to provide you with exceptional heart care, we have created designated Provider Care Teams.  These Care Teams include your primary Cardiologist (physician) and Advanced Practice Providers (APPs -  Physician Assistants and Nurse Practitioners) who all work together to provide you with the care you need, when you need it.  We recommend signing up for the patient portal called "MyChart".  Sign up information is provided on this After Visit Summary.  MyChart is used to connect with patients for Virtual Visits (Telemedicine).  Patients are able to view lab/test results, encounter notes, upcoming appointments, etc.  Non-urgent messages can be sent to your provider as well.   To learn more about what you can do with MyChart, go to  [email or web address] next appointment:   In March as scheduled with Dr. CroitSallyanne Kusterher Instructions  To prevent or reduce lower extremity swelling: Eat a low salt diet. Salt makes the body hold onto extra fluid which causes swelling. Sit with legs elevated. For example, in the recliner or on an ottomUblyar knee-high compression stockings during the daytime. Ones labeled 15-20 mmHg provide good compression.  Jesse Bowers send you a MyChart message Monday to check on swelling and weight.   Recommend weighing daily and keeping a log. Please call our office if you have weight gain of 2 pounds overnight or 5 pounds in 1 week.   Date  Time Weight

## 2020-10-27 NOTE — Telephone Encounter (Signed)
This encounter was created in error - please disregard.

## 2020-10-29 ENCOUNTER — Encounter (HOSPITAL_BASED_OUTPATIENT_CLINIC_OR_DEPARTMENT_OTHER): Payer: Self-pay

## 2020-10-29 ENCOUNTER — Ambulatory Visit (INDEPENDENT_AMBULATORY_CARE_PROVIDER_SITE_OTHER): Payer: BC Managed Care – PPO | Admitting: Psychologist

## 2020-10-29 DIAGNOSIS — F411 Generalized anxiety disorder: Secondary | ICD-10-CM

## 2020-10-29 DIAGNOSIS — F331 Major depressive disorder, recurrent, moderate: Secondary | ICD-10-CM

## 2020-10-31 ENCOUNTER — Other Ambulatory Visit: Payer: Self-pay

## 2020-10-31 DIAGNOSIS — L4050 Arthropathic psoriasis, unspecified: Secondary | ICD-10-CM

## 2020-10-31 DIAGNOSIS — M5416 Radiculopathy, lumbar region: Secondary | ICD-10-CM

## 2020-11-01 MED ORDER — MORPHINE SULFATE ER 30 MG PO CP24: ORAL_CAPSULE | ORAL | 0 refills | Status: DC

## 2020-11-01 MED ORDER — OXYCODONE HCL 10 MG PO TABS: 10.0000 mg | ORAL_TABLET | Freq: Three times a day (TID) | ORAL | 0 refills | Status: DC | PRN

## 2020-11-01 MED ORDER — MORPHINE SULFATE ER 20 MG PO CP24: ORAL_CAPSULE | ORAL | 0 refills | Status: DC

## 2020-11-01 NOTE — Telephone Encounter (Signed)
All set!

## 2020-11-07 ENCOUNTER — Ambulatory Visit (INDEPENDENT_AMBULATORY_CARE_PROVIDER_SITE_OTHER): Payer: BC Managed Care – PPO | Admitting: Psychologist

## 2020-11-07 ENCOUNTER — Ambulatory Visit: Payer: BC Managed Care – PPO | Admitting: Psychologist

## 2020-11-07 DIAGNOSIS — F331 Major depressive disorder, recurrent, moderate: Secondary | ICD-10-CM

## 2020-11-07 DIAGNOSIS — F411 Generalized anxiety disorder: Secondary | ICD-10-CM

## 2020-11-08 LAB — BASIC METABOLIC PANEL
BUN/Creatinine Ratio: 11 (ref 9–20)
BUN: 12 mg/dL (ref 6–24)
CO2: 25 mmol/L (ref 20–29)
Calcium: 8.8 mg/dL (ref 8.7–10.2)
Chloride: 99 mmol/L (ref 96–106)
Creatinine, Ser: 1.09 mg/dL (ref 0.76–1.27)
Glucose: 91 mg/dL (ref 65–99)
Potassium: 4.3 mmol/L (ref 3.5–5.2)
Sodium: 138 mmol/L (ref 134–144)
eGFR: 80 mL/min/{1.73_m2} (ref >59)

## 2020-11-08 LAB — CBC
Hematocrit: 43.2 % (ref 37.5–51.0)
Hemoglobin: 14 g/dL (ref 13.0–17.7)
MCH: 26.2 pg — ABNORMAL LOW (ref 26.6–33.0)
MCHC: 32.4 g/dL (ref 31.5–35.7)
MCV: 81 fL (ref 79–97)
Platelets: 190 10*3/uL (ref 150–450)
RBC: 5.35 x10E6/uL (ref 4.14–5.80)
RDW: 15.5 % — ABNORMAL HIGH (ref 11.6–15.4)
WBC: 3.1 10*3/uL — ABNORMAL LOW (ref 3.4–10.8)

## 2020-11-14 ENCOUNTER — Ambulatory Visit (INDEPENDENT_AMBULATORY_CARE_PROVIDER_SITE_OTHER): Payer: BC Managed Care – PPO | Admitting: Psychologist

## 2020-11-14 DIAGNOSIS — F411 Generalized anxiety disorder: Secondary | ICD-10-CM | POA: Diagnosis not present

## 2020-11-14 DIAGNOSIS — F331 Major depressive disorder, recurrent, moderate: Secondary | ICD-10-CM | POA: Diagnosis not present

## 2020-11-17 ENCOUNTER — Other Ambulatory Visit: Payer: Self-pay | Admitting: Cardiovascular Disease

## 2020-11-17 DIAGNOSIS — I5032 Chronic diastolic (congestive) heart failure: Secondary | ICD-10-CM

## 2020-11-21 ENCOUNTER — Ambulatory Visit (INDEPENDENT_AMBULATORY_CARE_PROVIDER_SITE_OTHER): Payer: BC Managed Care – PPO | Admitting: Psychologist

## 2020-11-21 DIAGNOSIS — F331 Major depressive disorder, recurrent, moderate: Secondary | ICD-10-CM

## 2020-11-21 DIAGNOSIS — F411 Generalized anxiety disorder: Secondary | ICD-10-CM | POA: Diagnosis not present

## 2020-11-28 ENCOUNTER — Telehealth: Payer: Self-pay | Admitting: Physical Medicine & Rehabilitation

## 2020-11-28 ENCOUNTER — Encounter: Payer: BC Managed Care – PPO | Admitting: Physical Medicine & Rehabilitation

## 2020-11-28 DIAGNOSIS — M5416 Radiculopathy, lumbar region: Secondary | ICD-10-CM

## 2020-11-28 DIAGNOSIS — L4050 Arthropathic psoriasis, unspecified: Secondary | ICD-10-CM

## 2020-11-28 MED ORDER — MORPHINE SULFATE ER 20 MG PO CP24: ORAL_CAPSULE | ORAL | 0 refills | Status: DC

## 2020-11-28 MED ORDER — MORPHINE SULFATE ER 30 MG PO CP24: ORAL_CAPSULE | ORAL | 0 refills | Status: DC

## 2020-11-28 MED ORDER — OXYCODONE HCL 10 MG PO TABS: 10.0000 mg | ORAL_TABLET | Freq: Three times a day (TID) | ORAL | 0 refills | Status: DC | PRN

## 2020-11-28 NOTE — Telephone Encounter (Signed)
Patient had an appointment today. He was not going to make it in time for his appointment. He is rescheduled for 01/09/21. He needs his medications sent into pharmacy.

## 2020-11-28 NOTE — Telephone Encounter (Signed)
Meds RF'ed

## 2020-12-04 ENCOUNTER — Other Ambulatory Visit: Payer: Self-pay

## 2020-12-04 ENCOUNTER — Encounter (HOSPITAL_BASED_OUTPATIENT_CLINIC_OR_DEPARTMENT_OTHER): Payer: Self-pay | Admitting: Family

## 2020-12-04 ENCOUNTER — Ambulatory Visit (INDEPENDENT_AMBULATORY_CARE_PROVIDER_SITE_OTHER): Payer: BC Managed Care – PPO | Admitting: Family

## 2020-12-04 VITALS — BP 110/68 | HR 93 | Ht 69.0 in | Wt 208.0 lb

## 2020-12-04 DIAGNOSIS — G4733 Obstructive sleep apnea (adult) (pediatric): Secondary | ICD-10-CM | POA: Diagnosis not present

## 2020-12-04 DIAGNOSIS — R000 Tachycardia, unspecified: Secondary | ICD-10-CM

## 2020-12-04 DIAGNOSIS — I5032 Chronic diastolic (congestive) heart failure: Secondary | ICD-10-CM

## 2020-12-04 DIAGNOSIS — G9519 Other vascular myelopathies: Secondary | ICD-10-CM

## 2020-12-04 DIAGNOSIS — R002 Palpitations: Secondary | ICD-10-CM | POA: Diagnosis not present

## 2020-12-04 DIAGNOSIS — Z7901 Long term (current) use of anticoagulants: Secondary | ICD-10-CM

## 2020-12-04 DIAGNOSIS — R600 Localized edema: Secondary | ICD-10-CM

## 2020-12-04 MED ORDER — METOPROLOL TARTRATE 25 MG PO TABS: 25.0000 mg | ORAL_TABLET | Freq: Two times a day (BID) | ORAL | 5 refills | Status: DC

## 2020-12-04 MED ORDER — METOPROLOL SUCCINATE ER 25 MG PO TB24: 25.0000 mg | ORAL_TABLET | Freq: Every day | ORAL | 5 refills | Status: DC

## 2020-12-04 NOTE — Progress Notes (Signed)
Office Visit    Patient Name: Jesse Bowers Date of Encounter: 12/04/2020  PCP:  Shawnee Knapp, MD   [place]  Cardiologist:  Sanda Klein, MD  Advanced Practice Provider:  No care team member to display Electrophysiologist:  None    Chief Complaint    Jesse Bowers is a 55 y.o. 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 He is a transgender male to male on chronic andogren therapy (transition began around the age of 66).   Past Medical History    Past Medical History:  Diagnosis Date   Abnormal weight loss    Anxiety    Arthritis    Cataract    OU   Celiac disease    Cervical neck pain with evidence of disc disease    patient has a cyst    Chronic constipation    Chronic diastolic heart failure (HCC)    Pt. 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   Family history of adverse reaction to anesthesia    family has problems with anesthesia of nausea and vomiting    Male-to-male transgender person    GERD (gastroesophageal reflux disease)    History of in 20's   Gluten enteropathy    H/O parotitis    right    Hard of hearing    History of kidney stones    History of retinal tear    Bilateral   History of staph infection    required wound vac   Hx-TIA (transient ischemic attack)    2015   Kidney stones 08/2020   LVH (left ventricular hypertrophy) 12/15/2016   Mild, noted on ECHO   MVP (mitral valve prolapse)    NAFL (nonalcoholic fatty liver)    Neck pain    Neuromuscular disorder ([place])    bilateral neuropathy feet.   Nuclear sclerotic cataract of both  eyes 09/08/2019   Pneumonia 12/17/2010   Polycythemia    Polycythemia, secondary    PONV (postoperative nausea and vomiting)    Protein C deficiency (HCC)    Dr. Anne Fu   Psoriasis    16 X10 cm psoriatic rash on sole of left foot ; open and occ scant bleeding;    psoriatic arthritis    PTSD (post-traumatic stress disorder)    Scaphoid fracture of wrist 09/23/2013   Seizure ([place])    childhood, medication until age 35 then weanned completely off   Sleep apnea    split night study last done by Dr. [name] 06/18/15 shows severe OSA, CSA, and hypersomnia, rec bipap   Splenomegaly    Stenosis of ureteropelvic junction (UPJ)    left   Stroke [facility])    CVA vs TIA in left cerebrum causing slight right sided weakness-Dr. [name] follows   Syrinx of spinal cord ([place]) 01/06/2014   c spine on MRI   Tachycardia    hx of    Transfusion history    past history- none recent, after surgeries due to blood loss   Wears glasses    Wears hearing aid  Past Surgical History:  Procedure Laterality Date   ABDOMINAL HYSTERECTOMY Bilateral 1994   TAH, BSO- tranverse incision at 55 yo   ANKLE ARTHROSCOPY WITH RECONSTRUCTION Right 2007   CHOLECYSTECTOMY     laparoscopic   COLONOSCOPY     x3   EYE SURGERY     Left eye 03/02/2018, right 02/15/2018   HIP ARTHROSCOPY W/ LABRAL REPAIR Right 05/11/2013   acetabular labral tear 03/30/2013   KNEE ARTHROPLASTY Right    KNEE JOINT MANIPULATION Left    x3 under anesthesia   KNEE SURGERY Bilateral 1984   Right ACL, left PCL repair   LITHOTRIPSY  2005   LIVER BIOPSY  2013   normal results.   MASTECTOMY Bilateral    prior to 2009   MOUTH SURGERY     NASAL SEPTUM SURGERY N/A 09/20/2015   by ENT Dr. [name]   OVARIAN CYST SURGERY Left    size of grapefruit, was informed that she had shortened vagina   SHOULDER SURGERY Bilateral    Right 08/15/2016, Left 11/15/2016   THUMB ARTHROSCOPY Left    THYROIDECTOMY, PARTIAL Left 2008   TOTAL KNEE ARTHROPLASTY  Right 08/23/2018   Procedure: TOTAL KNEE ARTHROPLASTY;  Surgeon: [name], MD;  Location: [facility];  Service: Orthopedics;  Laterality: Right;  17mn   TOTAL KNEE REVISION Left 02/06/2016   Procedure: LEFT TOTAL KNEE REVISION;  Surgeon: FGaynelle Arabian MD;  Location: [facility];  Service: Orthopedics;  Laterality: Left;   TOTAL KNEE REVISION Left 04/22/2017   Procedure: Left knee polyethylene revision;  Surgeon: AGaynelle Arabian MD;  Location: [facility];  Service: Orthopedics;  Laterality: Left;   UPPER GI ENDOSCOPY  2003    Allergies  Allergies  Allergen Reactions   Penicillin G Anaphylaxis and Other (See Comments)    Has patient had a PCN reaction causing immediate rash, facial/tongue/throat swelling, SOB or lightheadedness with hypotension: Yes Has patient had a PCN reaction causing severe rash involving mucus membranes or skin necrosis: No Has patient had a PCN reaction that required hospitalization Yes Has patient had a PCN reaction occurring within the last 10 years: No If all of the above answers are "NO", then may proceed with Cephalosporin use.    Sulfa Antibiotics Rash    Stevens-Johnson rash   Tegaderm Ag Mesh [Silver] Dermatitis    Causes blistering wounds    Ibuprofen Other (See Comments)    Contraindicated with Xarelto.    Pregabalin Other (See Comments)    Ineffective   Sulfacetamide Sodium-Sulfur Rash    History of Present Illness    Jesse Bowers a 55y.o. 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 He is a transgender male to male on chronic andogren therapy (transition began around the age of 89.  He was last seen 10/25/20.  Echo 04/27/20 LVEF ?60%, GLD -266% grade 1 diastolic dysfunction. He had a brief period of volume excess after thumb surgery 05/2020. When seen 08/29/20 he noted he was planning gender affirming  [facility] As he was doing well from cardiac perspective he was provided clearance for surgery and given permission to hold anticoagulation.   He was seen 10/25/20 due to weight gain and edema in setting of prednisone use for psoriasis flare. He was provided short course of increased dose Lasix.   He presents today for  follow up. He notes he recently started using a new application that tracks his blood pressure and heart rate. Blood pressure routinely in the 110s-120s/80s-90s. Notes a few episodes of hypertension with systolic blood pressure up to the 140s. Feels his breathing is a little off but no cough nor wheezing. He has been having intermittent headache. Endorses palpitations and sensation of heart racing. Home heart rates 70s-110s. He previously wore a monitor 07/2020 with NSR and ST. Addition of beta blocker was considered but not started at the time as overall asymptomatic. Reports staying well hydrated, no excessive caffeine, and no OTC proarrhythmic agents. No recent stressors. Reports his LE edema is overall well controlled with Lasix 11m QD and additional dose approximately once per week. He does try to elevate his legs when sitting.   EKGs/Labs/Other Studies Reviewed:   The following studies were reviewed today:  Echo 04/27/20  1. Left ventricular ejection fraction by 3D volume is 61 %. The left  ventricle has normal function. The left ventricle has no regional wall  motion abnormalities. Left ventricular diastolic parameters are consistent  with Grade I diastolic dysfunction  (impaired relaxation). The average left ventricular global longitudinal  strain is -24.0 %. The global longitudinal strain is normal.   2. Right ventricular systolic function is normal. The right ventricular  size is normal. There is normal pulmonary artery systolic pressure. The  estimated right ventricular systolic pressure is 227.0mmHg.   3. The mitral valve  is normal in structure. Mild mitral valve  regurgitation. No evidence of mitral stenosis.   4. The aortic valve is normal in structure. Aortic valve regurgitation is  not visualized. No aortic stenosis is present.   5. The inferior vena cava is normal in size with greater than 50%  respiratory variability, suggesting right atrial pressure of 3 mmHg   Monitor 07/17/20 Normal sinus rhythm with normal circadian variation, frequent daytime episodes of sinus tachycardia. Rare isolated PACs and PVCs. No complex arrhythmia and no bradycardia is seen.   Normal arrhythmia monitor. Episodes of daytime sinus tachycardia appear consistent with a physiological response.  EKG:  No EKG today.  Recent Labs: 08/24/2020: ALT 29 11/07/2020: BUN 12; Creatinine, Ser 1.09; Hemoglobin 14.0; Platelets 190; Potassium 4.3; Sodium 138  Recent Lipid Panel    Component Value Date/Time   CHOL 142 01/05/2018 1509   TRIG 45 01/05/2018 1509   HDL 52 01/05/2018 1509   CHOLHDL 2.7 01/05/2018 1509   LDLCALC 81 01/05/2018 1509    Home Medications   Current Meds  Medication Sig   acetic acid-hydrocortisone (VOSOL-HC) OTIC solution hydrocortisone-acetic acid 1 %-2 % ear drops   acyclovir ointment (ZOVIRAX) 5 % as needed.   baclofen (LIORESAL) 20 MG tablet TAKE 1 TABLET BY MOUTH FOUR TIMES A DAY   Calcipotriene-Betameth Diprop 0.005-0.064 % FOAM Apply 1 application topically 2 (two) times daily as needed.    Clobetasol Propionate Emulsion 0.05 % topical foam daily.   clorazepate (TRANXENE) 15 MG tablet Take 15 mg by mouth at bedtime.   clorazepate (TRANXENE) 7.5 MG tablet Take 7.5 mg by mouth 2 (two) times daily.   COSENTYX SENSOREADY, 300 MG, 150 MG/ML SOAJ Inject 2 Syringes into the skin every 28 (twenty-eight) days.   Cyanocobalamin (VITAMIN B-12) 5000 MCG SUBL Place 5,000 mcg under the tongue daily.   desonide (DESOWEN) 0.05 % cream Apply topically 2 (two) times daily.   ergocalciferol (VITAMIN D2) 1.25 MG (50000  UT) capsule Take 1 capsule by mouth once a  week.   Eszopiclone 3 MG TABS Take 3 mg by mouth at bedtime. Take immediately before bedtime   Fluocinolone Acetonide 0.01 % OIL Place 3 drops into both ears 2 (two) times daily as needed for itching.   folic acid (FOLVITE) 1 MG tablet Take 1 mg by mouth daily.   furosemide (LASIX) 20 MG tablet Take 1 tablet (20 mg total) by mouth daily. Take along with 66m tablet for total dose of 649m   furosemide (LASIX) 40 MG tablet Take 1 tablet (40 mg total) by mouth daily. Take along with 2080mablet for total dose of 69m43m halobetasol (ULTRAVATE) 0.05 % cream Apply topically.   hydrocortisone (ANUSOL-HC) 25 MG suppository Place 25 mg rectally as needed for hemorrhoids or anal itching.   Lifitegrast (XIIDRA) 5 % SOLN Place 1-2 drops into both eyes 2 times daily.   linaclotide (LINZESS) 290 MCG CAPS capsule Take by mouth daily.   methotrexate (RHEUMATREX) 2.5 MG tablet Take 15 mg by mouth once a week. Caution:Chemotherapy. Protect from light.   morphine (KADIAN) 20 MG 24 hr capsule SMARTSIG:1 Capsule(s) By Mouth Morning-Night   morphine (KADIAN) 30 MG 24 hr capsule SMARTSIG:1 Capsule(s) By Mouth Morning-Night   Oxycodone HCl 10 MG TABS Take 1 tablet (10 mg total) by mouth every 8 (eight) hours as needed.   polyethylene glycol (MIRALAX / GLYCOLAX) 17 g packet Take 17 g by mouth daily. 2 capfuls daily   potassium chloride (KLOR-CON) 10 MEQ tablet TAKE 2 TABLETS BY MOUTH EVERY DAY   rivaroxaban (XARELTO) 20 MG TABS tablet TAKE 1 TABLET(20 MG) BY MOUTH EVERY NIGHT AT BEDTIME   testosterone cypionate (DEPOTESTOSTERONE CYPIONATE) 200 MG/ML injection INJECT 0.5 MLS (100 MG TOTAL) INTO THE MUSCLE ONCE A WEEK.   ursodiol (ACTIGALL) 250 MG tablet Take 250 mg by mouth 2 (two) times daily with a meal.    ursodiol (ACTIGALL) 500 MG tablet Take 1 tablet midday with lunch and 1 tablet in the evening with dinneer   venlafaxine XR (EFFEXOR-XR) 150 MG 24 hr capsule Take 150 mg  by mouth daily with breakfast.   venlafaxine XR (EFFEXOR-XR) 75 MG 24 hr capsule Take 75 mg by mouth daily with breakfast.     Review of Systems      All other systems reviewed and are otherwise negative except as noted above.  Physical Exam    VS:  BP 110/68   Pulse 93   Ht 5' 9"  (1.753 m)   Wt 208 lb (94.3 kg)   SpO2 96%   BMI 30.72 kg/m  , BMI Body mass index is 30.72 kg/m.  Wt Readings from Last 3 Encounters:  12/04/20 208 lb (94.3 kg)  10/25/20 216 lb 6.4 oz (98.2 kg)  10/03/20 213 lb 12.8 oz (97 kg)     GEN: Well nourished, well developed, in no acute distress. HEENT: normal. Neck: Supple, no JVD, carotid bruits, or masses. Cardiac: RRR, no murmurs, rubs, or gallops. No clubbing, cyanosis. Bilateral 1+ pitting edema.  Radials/PT 2+ and equal bilaterally.  Respiratory:  Respirations regular and unlabored, clear to auscultation bilaterally. GI: Soft, nontender, nondistended. MS: No deformity or atrophy. Skin: Warm and dry, no rash. Neuro:  Strength and sensation are intact. Psych: Normal affect.  Assessment & Plan    Tachycardia / Palpitations - Previous monitor 07/2020 with NSR and ST likely physiological response. Echo 04/2020 normal LVEF, gr1DD, no significant valvular abnormality. Home HR 70s-110s. Symptomatic with tachycardia, palpitations. No excessive caffeine use, staying hydrated,  no proarrythmic agents, no recent stressors. Collect magnesium, TSH, BMP, CBC to rule out anemia, electrolyte abnormality, or thyroid abnormality as contributory. We will start Metoprolol succinate 39m daily.   HFpEF / LE edema  -Echo 04/2020 with normal LVEF and grade 1 diastolic dysfunction. Previous exacerbation 10/2020 in setting of prednisone use. Compliant with low salt diet and <2L fluid intake. Continue Lasix 630mQD. May take additional afternoon dose PRN for weight gain of 2 pounds overnight or 5 pounds in one week. Encouraged to elevate lower extremities when sitting as LE edema  is likely multifactorial HFpEF and venous insufficiency.   Neurogenic claudication - normal ABI 11/2018. No worsening claudication.   OSA - Continued BIPAP compliance encouraged.  Protein C deficiency / Chronic anticoagulation - History of CVA. No history of atrial fibrillation. On chronic anticoagulation with XaSheila OatsDenies bleeding complications.   Disposition: Follow up in December with CaLoel DubonnetNP and in March with Dr. CrSallyanne Kusters scheduled.  Signed, CaLoel DubonnetNP 12/04/2020, 2:16 PM [place]

## 2020-12-04 NOTE — Patient Instructions (Signed)
Medication Instructions:  Your physician has recommended you make the following change in your medication:   START Metoprolol Succinate one 29m tablet daily    *If you need a refill on your cardiac medications before your next appointment, please call your pharmacy*   Lab Work: Your physician recommends that you return for lab work today for magnesium, TSH, BMP, CBC  If you have labs (blood work) drawn today and your tests are completely normal, you will receive your results only by: MPlatea(if you have MyChart) OR A paper copy in the mail If you have any lab test that is abnormal or we need to change your treatment, we will call you to review the results.   Testing/Procedures: None ordered today.   Follow-Up: At [facility] you and your health needs are our priority.  As part of our continuing mission to provide you with exceptional heart care, we have created designated Provider Care Teams.  These Care Teams include your primary Cardiologist (physician) and Advanced Practice Providers (APPs -  Physician Assistants and Nurse Practitioners) who all work together to provide you with the care you need, when you need it.  We recommend signing up for the patient portal called "MyChart".  Sign up information is provided on this After Visit Summary.  MyChart is used to connect with patients for Virtual Visits (Telemedicine).  Patients are able to view lab/test results, encounter notes, upcoming appointments, etc.  Non-urgent messages can be sent to your provider as well.   To learn more about what you can do with MyChart, go to [email or web address]    Your next appointment:   In December as scheduled with CLoel Dubonnet NP   Other Instructions  Heart Healthy Diet Recommendations: A low-salt diet is recommended. Meats should be grilled, baked, or boiled. Avoid fried foods. Focus on lean protein sources like fish or chicken with vegetables and fruits. The American Heart  Association is a GMicrobiologist   Exercise recommendations: The American Heart Association recommends 150 minutes of moderate intensity exercise weekly. Try 30 minutes of moderate intensity exercise 4-5 times per week. This could include walking, jogging, or swimming.   To prevent palpitations: Make sure you are adequately hydrated.  Avoid and/or limit caffeine containing beverages like soda or tea. Exercise regularly.  Manage stress well. Some over the counter medications can cause palpitations such as Benadryl, AdvilPM, TylenolPM. Regular Advil or Tylenol do not cause palpitations.

## 2020-12-05 ENCOUNTER — Ambulatory Visit (INDEPENDENT_AMBULATORY_CARE_PROVIDER_SITE_OTHER): Payer: BC Managed Care – PPO | Admitting: Psychologist

## 2020-12-05 DIAGNOSIS — F331 Major depressive disorder, recurrent, moderate: Secondary | ICD-10-CM | POA: Diagnosis not present

## 2020-12-05 DIAGNOSIS — F411 Generalized anxiety disorder: Secondary | ICD-10-CM | POA: Diagnosis not present

## 2020-12-05 LAB — CBC
Hematocrit: 44.8 % (ref 37.5–51.0)
Hemoglobin: 14.6 g/dL (ref 13.0–17.7)
MCH: 25.9 pg — ABNORMAL LOW (ref 26.6–33.0)
MCHC: 32.6 g/dL (ref 31.5–35.7)
MCV: 79 fL (ref 79–97)
Platelets: 193 10*3/uL (ref 150–450)
RBC: 5.64 x10E6/uL (ref 4.14–5.80)
RDW: 15.1 % (ref 11.6–15.4)
WBC: 4 10*3/uL (ref 3.4–10.8)

## 2020-12-05 LAB — BASIC METABOLIC PANEL
BUN/Creatinine Ratio: 13 (ref 9–20)
BUN: 14 mg/dL (ref 6–24)
CO2: 23 mmol/L (ref 20–29)
Calcium: 9.2 mg/dL (ref 8.7–10.2)
Chloride: 101 mmol/L (ref 96–106)
Creatinine, Ser: 1.05 mg/dL (ref 0.76–1.27)
Glucose: 78 mg/dL (ref 70–99)
Potassium: 4.4 mmol/L (ref 3.5–5.2)
Sodium: 142 mmol/L (ref 134–144)
eGFR: 84 mL/min/{1.73_m2} (ref >59)

## 2020-12-05 LAB — MAGNESIUM: Magnesium: 2.2 mg/dL (ref 1.6–2.3)

## 2020-12-05 LAB — TSH: TSH: 2.35 u[IU]/mL (ref 0.450–4.500)

## 2020-12-12 ENCOUNTER — Ambulatory Visit (INDEPENDENT_AMBULATORY_CARE_PROVIDER_SITE_OTHER): Payer: BC Managed Care – PPO | Admitting: Psychologist

## 2020-12-12 DIAGNOSIS — F411 Generalized anxiety disorder: Secondary | ICD-10-CM | POA: Diagnosis not present

## 2020-12-12 DIAGNOSIS — F331 Major depressive disorder, recurrent, moderate: Secondary | ICD-10-CM

## 2020-12-13 ENCOUNTER — Encounter (HOSPITAL_BASED_OUTPATIENT_CLINIC_OR_DEPARTMENT_OTHER): Payer: Self-pay

## 2020-12-19 ENCOUNTER — Ambulatory Visit (INDEPENDENT_AMBULATORY_CARE_PROVIDER_SITE_OTHER): Payer: BC Managed Care – PPO | Admitting: Psychologist

## 2020-12-19 DIAGNOSIS — F411 Generalized anxiety disorder: Secondary | ICD-10-CM

## 2020-12-19 DIAGNOSIS — F331 Major depressive disorder, recurrent, moderate: Secondary | ICD-10-CM

## 2020-12-21 ENCOUNTER — Encounter: Payer: Self-pay | Admitting: Physical Medicine & Rehabilitation

## 2020-12-21 ENCOUNTER — Other Ambulatory Visit: Payer: Self-pay

## 2020-12-21 ENCOUNTER — Encounter
Payer: BC Managed Care – PPO | Attending: Physical Medicine & Rehabilitation | Admitting: Physical Medicine & Rehabilitation

## 2020-12-21 VITALS — BP 117/77 | HR 76 | Temp 99.1°F | Ht 69.0 in | Wt 206.0 lb

## 2020-12-21 DIAGNOSIS — L4050 Arthropathic psoriasis, unspecified: Secondary | ICD-10-CM | POA: Diagnosis present

## 2020-12-21 DIAGNOSIS — M5416 Radiculopathy, lumbar region: Secondary | ICD-10-CM | POA: Insufficient documentation

## 2020-12-21 NOTE — Patient Instructions (Addendum)
May resume Xarelto tomorrow 10/22  You received a Left L5-S1 epidural steroid injection under fluoroscopic guidance. This is the most accurate way to perform an epidural injection. This injection was performed to relieve thigh or leg or foot pain that may be related to a pinched nerve in the lumbar spine. The local anesthetic injected today may cause numbness in your leg for a couple hours. If it is severe we may need to observe you for 30-60 minutes after the injection. The cortisone medicine injected today may take several days to take full effect. This medicine can also cause facial flushing or feeling of being warm.  This injection may last for days weeks or months. It can be repeated if needed. If it is not effective, another spinal level may need to be injected. Other treatments include medication management as well as physical therapy. In some cases surgery may be an option.

## 2020-12-21 NOTE — Progress Notes (Signed)
  PROCEDURE RECORD [facility]   Name: [name] DOB:11-Dec-1965 MRN: [id number]  Date:12/21/2020  Physician: Alysia Penna, MD    Nurse/CMA: Jorja Loa  Allergies:  Allergies  Allergen Reactions   Gabapentin Nausea Only and Other (See Comments)    Other reaction(s): nausea, mental status Drowsiness and restlessness. Pt. States, " It makes me crazy, I can't take this medicine." Other reaction(s): Other (See Comments) Tardive dyskinesia   Penicillin G Anaphylaxis and Other (See Comments)    Has patient had a PCN reaction causing immediate rash, facial/tongue/throat swelling, SOB or lightheadedness with hypotension: Yes Has patient had a PCN reaction causing severe rash involving mucus membranes or skin necrosis: No Has patient had a PCN reaction that required hospitalization Yes Has patient had a PCN reaction occurring within the last 10 years: No If all of the above answers are "NO", then may proceed with Cephalosporin use.    Sulfa Antibiotics Rash    Stevens-Johnson rash   Nortriptyline Other (See Comments)    Dry mouth at 25 mg dose.  Tolerates 10 mg dose Other reaction(s): Other (See Comments) Dry Mouth   Pregabalin Other (See Comments)    Ineffective Other reaction(s): Other (See Comments) Tardive dyskinesia   Tegaderm Ag Mesh [Silver] Dermatitis    Causes blistering wounds    Ibuprofen Other (See Comments)    Contraindicated with Xarelto.    Sulfacetamide Sodium-Sulfur Rash    Consent Signed: Yes.    Is patient diabetic? No.  CBG today? N/A  Pregnant: No. LMP: No LMP recorded. (Menstrual status: Other). (age 46-55)  Anticoagulants: no, Xarelto last taken Monday 12/17/2020 Anti-inflammatory: no Antibiotics: no  Procedure: left L5-S1 Translaminar    Position: Prone Start Time: 1:57 PM  End Time: 2:05 PM  Fluoro Time: 32  RN/CMA Cynai Skeens MA [place] Deleo MA    Time 1:45 PM 2:09 PM    BP 117/77 118/85    Pulse 76 78     Respirations 16 16    O2 Sat 98 98    S/S 6 6    Pain Level 8/10 7/10     D/C home with Wife, patient A & O X 3, D/C instructions reviewed, and sits independently.         Subjective:    Patient ID: Jesse Bowers, adult    DOB: 1965/03/10, 55 y.o.   MRN: [id number]  HPI    Review of Systems     Objective:   Physical Exam        Assessment & Plan:

## 2020-12-21 NOTE — Progress Notes (Signed)
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   Last xarelto dose 12/17/2020  Informed consent was obtained after describing risk and benefits of the procedure with the patient, this includes bleeding, bruising, infection, paralysis and medication side effects.  The patient wishes to proceed and has given written consent.  Patient was placed in a prone position.  The lumbar area was marked and prepped with Betadine.  It was entered with a 25-gauge 1-1/2 inch needle and one mL of 1% lidocaine was injected into the skin and subcutaneous tissue.  Then a 17-gauge spinal needle was inserted under fluoroscopic guidance into the Left L5-S1 interlaminar space under AP and Lateral imaging.  Once needle tip of approximated the posterior elements, a loss of resistance technique was utilized with lateral imaging.  A positive loss of resistance was obtained and then confirmed by injecting 2 mL's of Isovue 200.  Then a solution containing 1.5 mL's of 45m/ml Celestone and 1.5 mL's of 1% lidocaine was injected.  The patient tolerated procedure well.  Post procedure instructions were given.  Please see post procedure form. May resume Xarelto 12/22/2020

## 2020-12-24 ENCOUNTER — Inpatient Hospital Stay: Payer: BC Managed Care – PPO | Attending: Hematology

## 2020-12-24 ENCOUNTER — Other Ambulatory Visit: Payer: Self-pay

## 2020-12-24 DIAGNOSIS — D6859 Other primary thrombophilia: Secondary | ICD-10-CM | POA: Insufficient documentation

## 2020-12-24 DIAGNOSIS — D751 Secondary polycythemia: Secondary | ICD-10-CM | POA: Diagnosis not present

## 2020-12-24 DIAGNOSIS — Z8673 Personal history of transient ischemic attack (TIA), and cerebral infarction without residual deficits: Secondary | ICD-10-CM | POA: Diagnosis not present

## 2020-12-24 DIAGNOSIS — D45 Polycythemia vera: Secondary | ICD-10-CM

## 2020-12-24 DIAGNOSIS — Z86718 Personal history of other venous thrombosis and embolism: Secondary | ICD-10-CM | POA: Insufficient documentation

## 2020-12-24 DIAGNOSIS — Z7901 Long term (current) use of anticoagulants: Secondary | ICD-10-CM | POA: Diagnosis not present

## 2020-12-24 LAB — CBC WITH DIFFERENTIAL (CANCER CENTER ONLY)
Abs Immature Granulocytes: 0.01 10*3/uL (ref 0.00–0.07)
Basophils Absolute: 0 10*3/uL (ref 0.0–0.1)
Basophils Relative: 0 %
Eosinophils Absolute: 0 10*3/uL (ref 0.0–0.5)
Eosinophils Relative: 0 %
HCT: 46.1 % (ref 39.0–52.0)
Hemoglobin: 14.8 g/dL (ref 13.0–17.0)
Immature Granulocytes: 0 %
Lymphocytes Relative: 28 %
Lymphs Abs: 1.3 10*3/uL (ref 0.7–4.0)
MCH: 26.6 pg (ref 26.0–34.0)
MCHC: 32.1 g/dL (ref 30.0–36.0)
MCV: 82.8 fL (ref 80.0–100.0)
Monocytes Absolute: 0.3 10*3/uL (ref 0.1–1.0)
Monocytes Relative: 6 %
Neutro Abs: 3.1 10*3/uL (ref 1.7–7.7)
Neutrophils Relative %: 66 %
Platelet Count: 193 10*3/uL (ref 150–400)
RBC: 5.57 MIL/uL (ref 4.22–5.81)
RDW: 18.4 % — ABNORMAL HIGH (ref 11.5–15.5)
WBC Count: 4.7 10*3/uL (ref 4.0–10.5)
nRBC: 0 % (ref 0.0–0.2)

## 2020-12-26 ENCOUNTER — Ambulatory Visit (INDEPENDENT_AMBULATORY_CARE_PROVIDER_SITE_OTHER): Payer: BC Managed Care – PPO | Admitting: Psychologist

## 2020-12-26 DIAGNOSIS — F411 Generalized anxiety disorder: Secondary | ICD-10-CM

## 2020-12-26 DIAGNOSIS — F331 Major depressive disorder, recurrent, moderate: Secondary | ICD-10-CM | POA: Diagnosis not present

## 2020-12-28 ENCOUNTER — Encounter (HOSPITAL_BASED_OUTPATIENT_CLINIC_OR_DEPARTMENT_OTHER): Payer: Self-pay

## 2021-01-03 ENCOUNTER — Telehealth: Payer: Self-pay | Admitting: *Deleted

## 2021-01-03 NOTE — Telephone Encounter (Signed)
My chart message sent to the patient to bring in his CPAP card for download.

## 2021-01-04 MED ORDER — MORPHINE SULFATE ER 20 MG PO CP24: ORAL_CAPSULE | ORAL | 0 refills | Status: DC

## 2021-01-04 MED ORDER — MORPHINE SULFATE ER 30 MG PO CP24: ORAL_CAPSULE | ORAL | 0 refills | Status: DC

## 2021-01-04 NOTE — Telephone Encounter (Signed)
[place] sent in

## 2021-01-06 MED ORDER — METOPROLOL SUCCINATE ER 25 MG PO TB24
ORAL_TABLET | ORAL | 3 refills | Status: DC
Start: 2021-01-06 — End: 2021-02-18

## 2021-01-09 ENCOUNTER — Encounter
Payer: BC Managed Care – PPO | Attending: Physical Medicine & Rehabilitation | Admitting: Physical Medicine & Rehabilitation

## 2021-01-09 ENCOUNTER — Encounter: Payer: Self-pay | Admitting: Physical Medicine & Rehabilitation

## 2021-01-09 ENCOUNTER — Other Ambulatory Visit: Payer: Self-pay

## 2021-01-09 VITALS — BP 122/85 | HR 80 | Ht 69.0 in

## 2021-01-09 DIAGNOSIS — M5416 Radiculopathy, lumbar region: Secondary | ICD-10-CM | POA: Insufficient documentation

## 2021-01-09 DIAGNOSIS — G609 Hereditary and idiopathic neuropathy, unspecified: Secondary | ICD-10-CM | POA: Diagnosis present

## 2021-01-09 DIAGNOSIS — L4050 Arthropathic psoriasis, unspecified: Secondary | ICD-10-CM | POA: Diagnosis present

## 2021-01-09 DIAGNOSIS — G894 Chronic pain syndrome: Secondary | ICD-10-CM | POA: Insufficient documentation

## 2021-01-09 MED ORDER — MORPHINE SULFATE ER 20 MG PO CP24: ORAL_CAPSULE | ORAL | 0 refills | Status: DC

## 2021-01-09 MED ORDER — OXYCODONE HCL 10 MG PO TABS: 10.0000 mg | ORAL_TABLET | Freq: Three times a day (TID) | ORAL | 0 refills | Status: DC | PRN

## 2021-01-09 MED ORDER — MORPHINE SULFATE ER 30 MG PO CP24: ORAL_CAPSULE | ORAL | 0 refills | Status: DC

## 2021-01-09 NOTE — Progress Notes (Signed)
Subjective:    Patient ID: Jesse Bowers,  male    DOB: 1965-12-14, 55 y.o.   MRN: [id number]  HPI  Amon is here in follow up of his chronic pain. He had pretty good results with the ESI although he is having pain in the right buttock and low back moreso this week. He's having a hard time getting comfortable  He remains on kadian and oxycodone for his pain control which generally helps him. He's getting back down to around 2 oxycodones per day at present.   He's on linzess which seems to be working for him from a bowel standpoint. He also tries to drink a good bit.   Insurance forced him to decrease/change his effexor to venlafaxine which is being adjusted by his psychiatrist  He has his uro-plastic surgery upcoming on 02/01/21   He went to the [employer] football game a couple weeks ago. He had a lot of fun but the trip wore him out. He said the game was worth it though!    Pain Inventory Average Pain 6 Pain Right Now 7 My pain is intermittent, sharp, burning, tingling, and aching  LOCATION OF PAIN  neck, bilateral knee, right shoulder, wrist, fingers, back, buttocks, toes  BOWEL Number of stools per week: 4 Oral laxative use Yes  Type of laxative Miralax, Linzess   BLADDER Normal In and out cath, frequency N/A Able to self cath  N/A Bladder incontinence No  Frequent urination No  Leakage with coughing No  Difficulty starting stream Yes  Incomplete bladder emptying No    Mobility walk with assistance ability to climb steps?  yes do you drive?  yes use a wheelchair transfers alone Do you have any goals in this area?  yes  Function disabled: date disabled 09/09/16 I need assistance with the following:  bathing, meal prep, household duties, and shopping Do you have any goals in this area?  yes  Neuro/Psych weakness numbness tremor tingling trouble walking spasms anxiety loss of taste or smell  Prior Studies Any changes since last visit?  no  Physicians  involved in your care Any changes since last visit?  no   Family History  Problem Relation Age of Onset   Stroke Maternal Grandfather        40   Heart attack Maternal Grandfather    Glaucoma Maternal Grandfather    Macular degeneration Maternal Grandfather    Breast cancer Sister    Hypertension Mother    Psoriasis Mother    Other Mother        meningioma developed ~2019   Glaucoma Mother    Cancer Paternal Grandfather    Heart attack Paternal Grandfather    Stroke Paternal Uncle        age 44   Polycythemia Paternal Uncle    Stroke Maternal Grandmother    Congestive Heart Failure Maternal Grandmother    Heart attack Maternal Grandmother    Protein C deficiency Sister 20       Miscarriages   Breast cancer Maternal Aunt 50   Social History   Socioeconomic History   Marital status: Married    Spouse name: Not on file   Number of children: 2   Years of education: 4y college   Highest education level: Not on file  Occupational History   Occupation: Pediatric Nurse practitioner    Comment: Not working since CVA 2015  Tobacco Use   Smoking status: Never   Smokeless tobacco: Never  Vaping Use  Vaping Use: Never used  Substance and Sexual Activity   Alcohol use: Yes    Comment: social   Drug use: No   Sexual activity: Yes    Birth control/protection: None    Comment: patient is a transgender on testosterone shots, no biological kids  Other Topics Concern   Not on file  Social History Narrative   Education 4 year college, former Therapist, sports X 15 years, pediatric nurse practitioner x 6 years, did NP degree from [employer] [place]. Relocated to [place] about 2 months ago from [place], [place]. Patient was in [place] for last 4 years and prior to that in [place]. His wife is working as Scientist, research (physical sciences) for [employer]. Patient is not working and applying for disability. They have 2 kids but no biologic children.    Social Determinants of Health   Financial Resource Strain: Not  on file  Food Insecurity: Not on file  Transportation Needs: Not on file  Physical Activity: Not on file  Stress: Not on file  Social Connections: Not on file   Past Surgical History:  Procedure Laterality Date   ABDOMINAL HYSTERECTOMY Bilateral 1994   TAH, BSO- tranverse incision at 55 yo   ANKLE ARTHROSCOPY WITH RECONSTRUCTION Right 2007   CHOLECYSTECTOMY     laparoscopic   COLONOSCOPY     x3   EYE SURGERY     Left eye 03/02/2018, right 02/15/2018   HIP ARTHROSCOPY W/ LABRAL REPAIR Right 05/11/2013   acetabular labral tear 03/30/2013   KNEE ARTHROPLASTY Right    KNEE JOINT MANIPULATION Left    x3 under anesthesia   KNEE SURGERY Bilateral 1984   Right ACL, left PCL repair   LITHOTRIPSY  2005   LIVER BIOPSY  2013   normal results.   MASTECTOMY Bilateral    prior to 2009   MOUTH SURGERY     NASAL SEPTUM SURGERY N/A 09/20/2015   by ENT Dr. [name]   OVARIAN CYST SURGERY Left    size of grapefruit, was informed that she had shortened vagina   SHOULDER SURGERY Bilateral    Right 08/15/2016, Left 11/15/2016   THUMB ARTHROSCOPY Left    THYROIDECTOMY, PARTIAL Left 2008   TOTAL KNEE ARTHROPLASTY Right 08/23/2018   Procedure: TOTAL KNEE ARTHROPLASTY;  Surgeon: [name], MD;  Location: [facility];  Service: Orthopedics;  Laterality: Right;  52mn   TOTAL KNEE REVISION Left 02/06/2016   Procedure: LEFT TOTAL KNEE REVISION;  Surgeon: FGaynelle Arabian MD;  Location: [facility];  Service: Orthopedics;  Laterality: Left;   TOTAL KNEE REVISION Left 04/22/2017   Procedure: Left knee polyethylene revision;  Surgeon: AGaynelle Arabian MD;  Location: [facility];  Service: Orthopedics;  Laterality: Left;   UPPER GI ENDOSCOPY  2003   Past Medical History:  Diagnosis Date   Abnormal weight loss    Anxiety    Arthritis    Cataract    OU   Celiac disease    Cervical neck pain with evidence of disc disease    patient has a cyst    Chronic constipation    Chronic diastolic heart failure (HCC)    Pt.  denies   Chronic pain    Degenerative disc disease at L5-S1 level    with stenosis   DVT (deep venous thrombosis) (HCC)    Right upper arm, bilateral leg   Eczema    inguinal, feet   Elevated liver enzymes    Failed total knee arthroplasty ([place] 04/22/2017   Family history of  adverse reaction to anesthesia    family has problems with anesthesia of nausea and vomiting    Male-to-male transgender person    GERD (gastroesophageal reflux disease)    History of in 20's   Gluten enteropathy    H/O parotitis    right    Hard of hearing    History of kidney stones    History of retinal tear    Bilateral   History of staph infection    required wound vac   Hx-TIA (transient ischemic attack)    2015   Kidney stones 08/2020   LVH (left ventricular hypertrophy) 12/15/2016   Mild, noted on ECHO   MVP (mitral valve prolapse)    NAFL (nonalcoholic fatty liver)    Neck pain    Neuromuscular disorder ([place])    bilateral neuropathy feet.   Nuclear sclerotic cataract of both eyes 09/08/2019   Pneumonia 12/17/2010   Polycythemia    Polycythemia, secondary    PONV (postoperative nausea and vomiting)    Protein C deficiency (HCC)    Dr. Anne Fu   Psoriasis    16 X10 cm psoriatic rash on sole of left foot ; open and occ scant bleeding;    psoriatic arthritis    PTSD (post-traumatic stress disorder)    Scaphoid fracture of wrist 09/23/2013   Seizure ([place])    childhood, medication until age 11 then weanned completely off   Sleep apnea    split night study last done by Dr. [name] 06/18/15 shows severe OSA, CSA, and hypersomnia, rec bipap   Splenomegaly    Stenosis of ureteropelvic junction (UPJ)    left   Stroke [facility])    CVA vs TIA in left cerebrum causing slight right sided weakness-Dr. 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   HENT: Negative.    Eyes: Negative.   Respiratory: Negative.    Cardiovascular: Negative.   Gastrointestinal: Negative.   Endocrine: Negative.   Genitourinary: Negative.   Musculoskeletal:  Positive for back pain, gait problem and neck pain.  Skin: Negative.   Allergic/Immunologic: Negative.   Neurological:  Positive for tremors, weakness and numbness.  Hematological: Negative.   Psychiatric/Behavioral:  The patient is nervous/anxious.       Objective:   Physical Exam   General: No acute distress HEENT: NCAT, EOMI, oral membranes moist Cards: reg rate  Chest: normal effort Abdomen: Soft, NT, ND Skin: dry, intact Extremities: no edema Psych: pleasant and appropriate    Neuro: Pt is pleasant and alert and oriented x 3.   Strength is grossly 3-4/5 based on pain, splint.   reflexies are hyperactive. patchy sensory loss in legs.. Musculoskeletal: low back limited ROM. In wc      Assessment & Plan:   1. Psoriatic arthritis with pain in  multiple areas, most prominently feet, hands, elbows. His pain is also related to his prior CVA and associated motor/sensory change   2. 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 3. Hx of left total knee revision again on 04/21/17 per [facility] and right TKA 08/2018 4. Chronic  low back pain---MRI with severe DDD at L5-S1. Left S1 radiculopathy?  -s/p ESI 12/21/20  5. Protein C deficiency   6. Left shoulder subluxation, bicipital  tendonitis 7. Central sleep apnea 8. Polycythemia 9. Depression with anxiety.  0. Left lateral epidondylitis  11. 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  left sided weakness can fluctuate. Most recent cervical MRI (07/08/17) without significant change 12. 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 [place] 14. Right wrist psoriatic arthritis, tendonapthy, ganglion cysts s/p multiple recent surgeries.      Plan:  1. Continue venlafaxine per pyschiatry 2. right thumb jt replacement surgery by ortho next month. 3. Voltaren gel to hands, feet, knees, elbows--- continue 4. Continue baclofen for lower extermity spasms/back pain. 20 mg 4 times daily #120.  5. Since MTX has been increased will reduce  kadian from 65m to [id number] #60 and oxycodone 1035m70 back to baseline We will continue the controlled substance monitoring program, this consists of regular clinic visits, examinations, routine drug screening, pill counts as well as use of [place].   Medication was refilled and a second prescription was sent to the patient's pharmacy for next month.           6.   for plastic-urological procedure 12/2          7. fair result with latest ESI        -consider f/u ESI 8: OI constipation  -linzess    Fifteen minutes of face to face patient care time were spent during this visit. All questions were encouraged and answered.  Follow up with NP in 2 mos .

## 2021-01-09 NOTE — Patient Instructions (Signed)
PLEASE FEEL FREE TO CALL OUR OFFICE WITH ANY PROBLEMS OR QUESTIONS ([phone])      

## 2021-01-11 ENCOUNTER — Ambulatory Visit (INDEPENDENT_AMBULATORY_CARE_PROVIDER_SITE_OTHER): Payer: BC Managed Care – PPO | Admitting: Psychologist

## 2021-01-11 DIAGNOSIS — F411 Generalized anxiety disorder: Secondary | ICD-10-CM

## 2021-01-11 DIAGNOSIS — F331 Major depressive disorder, recurrent, moderate: Secondary | ICD-10-CM

## 2021-01-14 NOTE — Telephone Encounter (Signed)
error 

## 2021-01-18 ENCOUNTER — Ambulatory Visit (INDEPENDENT_AMBULATORY_CARE_PROVIDER_SITE_OTHER): Payer: BC Managed Care – PPO | Admitting: Psychologist

## 2021-01-18 DIAGNOSIS — F411 Generalized anxiety disorder: Secondary | ICD-10-CM | POA: Diagnosis not present

## 2021-01-18 DIAGNOSIS — F331 Major depressive disorder, recurrent, moderate: Secondary | ICD-10-CM

## 2021-02-08 ENCOUNTER — Encounter: Payer: Self-pay | Admitting: Hematology

## 2021-02-08 ENCOUNTER — Other Ambulatory Visit: Payer: Self-pay

## 2021-02-12 ENCOUNTER — Other Ambulatory Visit: Payer: Self-pay | Admitting: Physical Medicine & Rehabilitation

## 2021-02-12 DIAGNOSIS — R252 Cramp and spasm: Secondary | ICD-10-CM

## 2021-02-13 ENCOUNTER — Ambulatory Visit: Payer: BC Managed Care – PPO | Admitting: Psychologist

## 2021-02-14 ENCOUNTER — Encounter: Payer: Self-pay | Admitting: Physical Medicine & Rehabilitation

## 2021-02-15 MED ORDER — OXYCODONE HCL 15 MG PO TABS
15.0000 mg | ORAL_TABLET | Freq: Four times a day (QID) | ORAL | 0 refills | Status: DC | PRN
Start: 2021-02-15 — End: 2021-03-08

## 2021-02-15 NOTE — Telephone Encounter (Signed)
Oxycodone adjusted to 50m temporarily #60 for increased postop pain

## 2021-02-18 ENCOUNTER — Ambulatory Visit (INDEPENDENT_AMBULATORY_CARE_PROVIDER_SITE_OTHER): Payer: BC Managed Care – PPO | Admitting: Family

## 2021-02-18 ENCOUNTER — Encounter (HOSPITAL_BASED_OUTPATIENT_CLINIC_OR_DEPARTMENT_OTHER): Payer: Self-pay | Admitting: Family

## 2021-02-18 ENCOUNTER — Other Ambulatory Visit: Payer: Self-pay

## 2021-02-18 VITALS — BP 112/74 | HR 104 | Ht 69.0 in | Wt 210.0 lb

## 2021-02-18 DIAGNOSIS — I5032 Chronic diastolic (congestive) heart failure: Secondary | ICD-10-CM

## 2021-02-18 DIAGNOSIS — D649 Anemia, unspecified: Secondary | ICD-10-CM | POA: Diagnosis not present

## 2021-02-18 DIAGNOSIS — R002 Palpitations: Secondary | ICD-10-CM

## 2021-02-18 DIAGNOSIS — D6859 Other primary thrombophilia: Secondary | ICD-10-CM

## 2021-02-18 DIAGNOSIS — G9519 Other vascular myelopathies: Secondary | ICD-10-CM | POA: Diagnosis not present

## 2021-02-18 MED ORDER — METOPROLOL SUCCINATE ER 25 MG PO TB24: ORAL_TABLET | ORAL | 3 refills | Status: DC

## 2021-02-18 NOTE — Patient Instructions (Addendum)
Medication Instructions:   Your physician has recommended you make the following change in your medication:   CHANGE your Metoprolol Succinate to one tablet in the evening. May take an additional half tablet as needed for breakthrough palpitations.   *If you need a refill on your cardiac medications before your next appointment, please call your pharmacy*   Lab Work: Your physician recommends that you return for lab work today: CBC, BMP  If you have labs (blood work) drawn today and your tests are completely normal, you will receive your results only by: [place] (if you have [place]) OR A paper copy in the mail If you have any lab test that is abnormal or we need to change your treatment, we will call you to review the results.   Testing/Procedures: None ordered today   Follow-Up: At [facility], you and your health needs are our priority.  As part of our continuing mission to provide you with exceptional heart care, we have created designated Provider Care Teams.  These Care Teams include your primary Cardiologist (physician) and Advanced Practice Providers (APPs -  Physician Assistants and Nurse Practitioners) who all work together to provide you with the care you need, when you need it.  We recommend signing up for the patient portal called "MyChart".  Sign up information is provided on this After Visit Summary.  MyChart is used to connect with patients for Virtual Visits (Telemedicine).  Patients are able to view lab/test results, encounter notes, upcoming appointments, etc.  Non-urgent messages can be sent to your provider as well.   To learn more about what you can do with MyChart, go to [email or web address].    Your next appointment:   In March as scheduled with Dr. [name]   Other Instructions   If you are having to take the extra half tablet of Metoprolol often let us know and we can transition to an increased dose of your daily dose.   To prevent or  reduce lower extremity swelling: Eat a low salt diet. Salt makes the body hold onto extra fluid which causes swelling. Sit with legs elevated. For example, in the recliner or on an [place].  Wear knee-high compression stockings during the daytime. Ones labeled 15-20 mmHg provide good compression.

## 2021-02-18 NOTE — Progress Notes (Signed)
Office Visit    Patient Name: Jesse Bowers Date of Encounter: 02/18/2021  PCP:  Shawnee Knapp, MD   [place]  Cardiologist:  Sanda Klein, MD  Advanced Practice Provider:  No care team member to display Electrophysiologist:  None    Chief Complaint    Jesse Bowers is a 55 y.o. 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 He is a transgender male to male on chronic andogren therapy (transition began around the age of 57).   Past Medical History    Past Medical History:  Diagnosis Date   Abnormal weight loss    Anxiety    Arthritis    Cataract    OU   Celiac disease    Cervical neck pain with evidence of disc disease    patient has a cyst    Chronic constipation    Chronic diastolic heart failure (HCC)    Pt. 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   Family history of adverse reaction to anesthesia    family has problems with anesthesia of nausea and vomiting    Male-to-male transgender person    GERD (gastroesophageal reflux disease)    History of in 20's   Gluten enteropathy    H/O parotitis    right    Hard of hearing    History of kidney stones    History of retinal tear    Bilateral   History of staph infection    required wound vac   Hx-TIA (transient ischemic attack)    2015   Kidney stones 08/2020   LVH (left ventricular hypertrophy) 12/15/2016   Mild, noted on ECHO   MVP (mitral valve prolapse)    NAFL (nonalcoholic fatty liver)    Neck pain    Neuromuscular disorder ([place])    bilateral neuropathy feet.   Nuclear  sclerotic cataract of both eyes 09/08/2019   Pneumonia 12/17/2010   Polycythemia    Polycythemia, secondary    PONV (postoperative nausea and vomiting)    Protein C deficiency (HCC)    Dr. Anne Fu   Psoriasis    16 X10 cm psoriatic rash on sole of left foot ; open and occ scant bleeding;    psoriatic arthritis    PTSD (post-traumatic stress disorder)    Scaphoid fracture of wrist 09/23/2013   Seizure ([place])    childhood, medication until age 37 then weanned completely off   Sleep apnea    split night study last done by Dr. [name] 06/18/15 shows severe OSA, CSA, and hypersomnia, rec bipap   Splenomegaly    Stenosis of ureteropelvic junction (UPJ)    left   Stroke [facility])    CVA vs TIA in left cerebrum causing slight right sided weakness-Dr. [name] follows   Syrinx of spinal cord ([place]) 01/06/2014   c spine on MRI   Tachycardia    hx of    Transfusion history    past history- none recent, after surgeries due to blood loss   Wears glasses    Wears  hearing aid    Past Surgical History:  Procedure Laterality Date   ABDOMINAL HYSTERECTOMY Bilateral 1994   TAH, BSO- tranverse incision at 55 yo   ANKLE ARTHROSCOPY WITH RECONSTRUCTION Right 2007   CHOLECYSTECTOMY     laparoscopic   COLONOSCOPY     x3   EYE SURGERY     Left eye 03/02/2018, right 02/15/2018   HIP ARTHROSCOPY W/ LABRAL REPAIR Right 05/11/2013   acetabular labral tear 03/30/2013   KNEE ARTHROPLASTY Right    KNEE JOINT MANIPULATION Left    x3 under anesthesia   KNEE SURGERY Bilateral 1984   Right ACL, left PCL repair   LITHOTRIPSY  2005   LIVER BIOPSY  2013   normal results.   MASTECTOMY Bilateral    prior to 2009   MOUTH SURGERY     NASAL SEPTUM SURGERY N/A 09/20/2015   by ENT Dr. [name]   OVARIAN CYST SURGERY Left    size of grapefruit, was informed that she had shortened vagina   SHOULDER SURGERY Bilateral    Right 08/15/2016, Left 11/15/2016   THUMB ARTHROSCOPY Left    THYROIDECTOMY, PARTIAL Left 2008    TOTAL KNEE ARTHROPLASTY Right 08/23/2018   Procedure: TOTAL KNEE ARTHROPLASTY;  Surgeon: [name], MD;  Location: [facility];  Service: Orthopedics;  Laterality: Right;  24mn   TOTAL KNEE REVISION Left 02/06/2016   Procedure: LEFT TOTAL KNEE REVISION;  Surgeon: FGaynelle Arabian MD;  Location: [facility];  Service: Orthopedics;  Laterality: Left;   TOTAL KNEE REVISION Left 04/22/2017   Procedure: Left knee polyethylene revision;  Surgeon: AGaynelle Arabian MD;  Location: [facility];  Service: Orthopedics;  Laterality: Left;   UPPER GI ENDOSCOPY  2003    Allergies  Allergies  Allergen Reactions   Gabapentin Nausea Only and Other (See Comments)    Other reaction(s): nausea, mental status Drowsiness and restlessness. Pt. States, " It makes me crazy, I can't take this medicine." Other reaction(s): Other (See Comments) Tardive dyskinesia   Penicillin G Anaphylaxis and Other (See Comments)    Has patient had a PCN reaction causing immediate rash, facial/tongue/throat swelling, SOB or lightheadedness with hypotension: Yes Has patient had a PCN reaction causing severe rash involving mucus membranes or skin necrosis: No Has patient had a PCN reaction that required hospitalization Yes Has patient had a PCN reaction occurring within the last 10 years: No If all of the above answers are "NO", then may proceed with Cephalosporin use.    Sulfa Antibiotics Rash    Stevens-Johnson rash   Nortriptyline Other (See Comments)    Dry mouth at 25 mg dose.  Tolerates 10 mg dose Other reaction(s): Other (See Comments) Dry Mouth   Pregabalin Other (See Comments)    Ineffective Other reaction(s): Other (See Comments) Tardive dyskinesia   Tegaderm Ag Mesh [Silver] Dermatitis    Causes blistering wounds    Ibuprofen Other (See Comments)    Contraindicated with Xarelto.    Sulfacetamide Sodium-Sulfur Rash    History of Present Illness    CDaniele Yankowskiis a 55y.o. 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 He is a transgender male to male on chronic andogren therapy (transition began around the age of 89.  He was last seen 12/04/20  Echo 04/27/20 LVEF ?60%, GLD -229% grade 1 diastolic dysfunction. He had a brief period of volume excess  after thumb surgery 05/2020. When seen 08/29/20 he noted he was planning gender affirming urological intervention with Dr. Francesca Jewett at [facility]. As he was doing well from cardiac perspective he was provided clearance for surgery and given permission to hold anticoagulation.   He was seen 10/25/20 due to weight gain and edema in setting of prednisone use for psoriasis flare. He was provided short course of increased dose Lasix.  Seen 12/2020 noting tachycardia and palpitations, Metoprolol initiated given known previous monitor 07/2020 with NSR and ST. Since last seen he underwent metoidioplasty, vaginectomy, scrotoplasty, perineal urethrostomy on 02/01/21 by [facility]. This was complicated by fever and hypotension post op day 1 requiring IV abx and vasopressors.   He presents today for follow up. Notes since hospital discharge notes breakthrough palpitations. We did discuss post op anemia with Hb on discharge of 8. Known polycythermia vera so anticipates his Hb will recuperate quickly. Reports no shortness of breath at rest and stable mild dyspnea on exertion. Reports no chest pain, pressure, or tightness. No  orthopnea, PND. Some edema related to fluid retention after discharge but this has since resolved. Reports no palpitations.    EKGs/Labs/Other Studies Reviewed:   The following studies were reviewed today:  Echo 04/27/20  1. Left ventricular ejection fraction by 3D volume is 61 %. The left  ventricle has normal function. The left ventricle has no regional wall  motion abnormalities. Left ventricular diastolic parameters are  consistent  with Grade I diastolic dysfunction  (impaired relaxation). The average left ventricular global longitudinal  strain is -24.0 %. The global longitudinal strain is normal.   2. Right ventricular systolic function is normal. The right ventricular  size is normal. There is normal pulmonary artery systolic pressure. The  estimated right ventricular systolic pressure is 98.3 mmHg.   3. The mitral valve is normal in structure. Mild mitral valve  regurgitation. No evidence of mitral stenosis.   4. The aortic valve is normal in structure. Aortic valve regurgitation is  not visualized. No aortic stenosis is present.   5. The inferior vena cava is normal in size with greater than 50%  respiratory variability, suggesting right atrial pressure of 3 mmHg   Monitor 07/17/20 Normal sinus rhythm with normal circadian variation, frequent daytime episodes of sinus tachycardia. Rare isolated PACs and PVCs. No complex arrhythmia and no bradycardia is seen.   Normal arrhythmia monitor. Episodes of daytime sinus tachycardia appear consistent with a physiological response.  EKG:  No EKG today.  Recent Labs: 08/24/2020: ALT 29 12/04/2020: BUN 14; Creatinine, Ser 1.05; Magnesium 2.2; Potassium 4.4; Sodium 142; TSH 2.350 12/24/2020: Hemoglobin 14.8; Platelet Count 193  Recent Lipid Panel    Component Value Date/Time   CHOL 142 01/05/2018 1509   TRIG 45 01/05/2018 1509   HDL 52 01/05/2018 1509   CHOLHDL 2.7 01/05/2018 1509   LDLCALC 81 01/05/2018 1509    Home Medications   No outpatient medications have been marked as taking for the 02/18/21 encounter (Appointment) with Loel Dubonnet, NP.     Review of Systems      All other systems reviewed and are otherwise negative except as noted above.  Physical Exam    VS:  There were no vitals taken for this visit. , BMI There is no height or weight on file to calculate BMI.  Wt Readings from Last 3 Encounters:  12/21/20 206 lb (93.4 kg)   12/04/20 208 lb (94.3 kg)  10/25/20 216 lb 6.4 oz (98.2 kg)  GEN: Well nourished, well developed, in no acute distress. HEENT: normal. Neck: Supple, no JVD, carotid bruits, or masses. Cardiac: RRR, no murmurs, rubs, or gallops. No clubbing, cyanosis, edema.  Radials/PT 2+ and equal bilaterally.  Respiratory:  Respirations regular and unlabored, clear to auscultation bilaterally. GI: Soft, nontender, nondistended. MS: No deformity or atrophy. Skin: Warm and dry, no rash. Neuro:  Strength and sensation are intact. Psych: Normal affect.  Assessment & Plan    Tachycardia / Palpitations - Previous monitor 07/2020 with NSR and ST likely physiological response. Echo 04/2020 normal LVEF, gr1DD, no significant valvular abnormality.  Some breakthrough palpitations on Toprol 73m QD since hospital discharge after urological procedure complicated by hypotension/fever requiring abx. He may take additional half tablet of Toprol as needed for breakthrough palpitations. Will repeat CBC, BMP to address post op anemia and rule out electrolyte abnormality as contributory.   HFpEF / LE edema  -Echo 04/2020 with normal LVEF and grade 1 diastolic dysfunction. Compliant with low salt diet and <2L fluid intake.  Encouraged to elevate lower extremities when sitting as LE edema is likely multifactorial HFpEF and venous insufficiency. No edema on exam today.   Neurogenic claudication - normal ABI 11/2018. No worsening claudication.   OSA - Continued BIPAP compliance encouraged.  Protein C deficiency / Chronic anticoagulation - History of CVA. No history of atrial fibrillation. On chronic anticoagulation with XSheila Oats Denies bleeding complications.   Disposition: Follow up in March with Dr. CSallyanne Kusteras scheduled.  Signed, CLoel Dubonnet NP 02/18/2021, 10:00 AM [place]

## 2021-02-19 ENCOUNTER — Encounter (HOSPITAL_BASED_OUTPATIENT_CLINIC_OR_DEPARTMENT_OTHER): Payer: Self-pay

## 2021-02-19 LAB — BASIC METABOLIC PANEL
BUN/Creatinine Ratio: 10 (ref 9–20)
BUN: 11 mg/dL (ref 6–24)
CO2: 20 mmol/L (ref 20–29)
Calcium: 9.2 mg/dL (ref 8.7–10.2)
Chloride: 102 mmol/L (ref 96–106)
Creatinine, Ser: 1.09 mg/dL (ref 0.76–1.27)
Glucose: 129 mg/dL — ABNORMAL HIGH (ref 70–99)
Potassium: 4.7 mmol/L (ref 3.5–5.2)
Sodium: 138 mmol/L (ref 134–144)
eGFR: 80 mL/min/{1.73_m2} (ref >59)

## 2021-02-19 LAB — CBC WITH DIFFERENTIAL/PLATELET
Basophils Absolute: 0 10*3/uL (ref 0.0–0.2)
Basos: 0 %
EOS (ABSOLUTE): 0 10*3/uL (ref 0.0–0.4)
Eos: 0 %
Hematocrit: 32.4 % — ABNORMAL LOW (ref 37.5–51.0)
Hemoglobin: 10.2 g/dL — ABNORMAL LOW (ref 13.0–17.7)
Immature Grans (Abs): 0 10*3/uL (ref 0.0–0.1)
Immature Granulocytes: 0 %
Lymphocytes Absolute: 0.9 10*3/uL (ref 0.7–3.1)
Lymphs: 20 %
MCH: 25.5 pg — ABNORMAL LOW (ref 26.6–33.0)
MCHC: 31.5 g/dL (ref 31.5–35.7)
MCV: 81 fL (ref 79–97)
Monocytes Absolute: 0.3 10*3/uL (ref 0.1–0.9)
Monocytes: 7 %
Neutrophils Absolute: 3.3 10*3/uL (ref 1.4–7.0)
Neutrophils: 73 %
Platelets: 380 10*3/uL (ref 150–450)
RBC: 4 x10E6/uL — ABNORMAL LOW (ref 4.14–5.80)
RDW: 14.8 % (ref 11.6–15.4)
WBC: 4.6 10*3/uL (ref 3.4–10.8)

## 2021-02-20 ENCOUNTER — Ambulatory Visit: Payer: BC Managed Care – PPO | Admitting: Psychologist

## 2021-02-25 IMAGING — DX CHEST - 2 VIEW
2 series · 2 of 2 positions shown · non-contrast
Comparison: 08/22/2015

CLINICAL DATA: Pre operative respiratory exam. Patient is to have
right knee replacement.

EXAM:
CHEST - 2 VIEW

[chest pa]
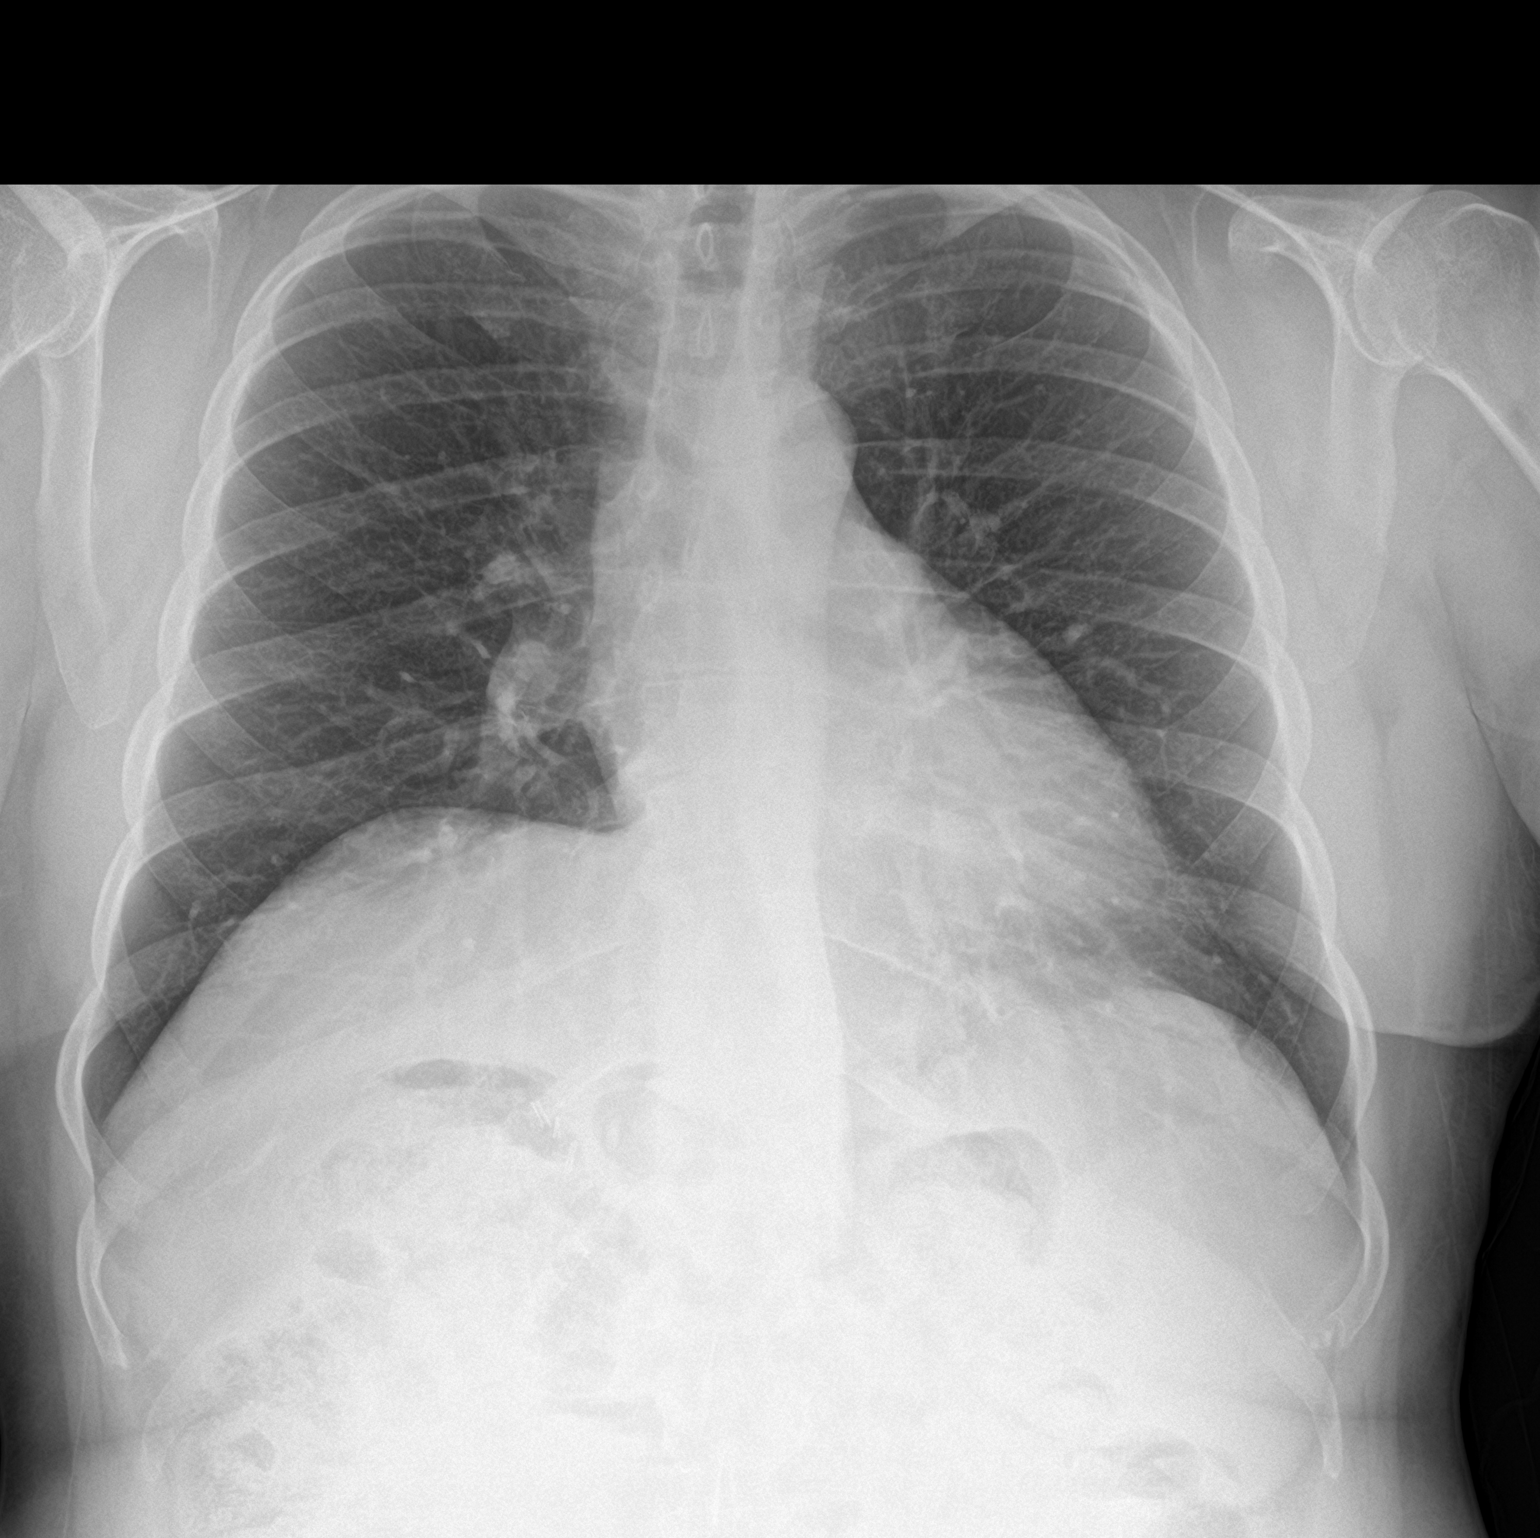

[chest lat]
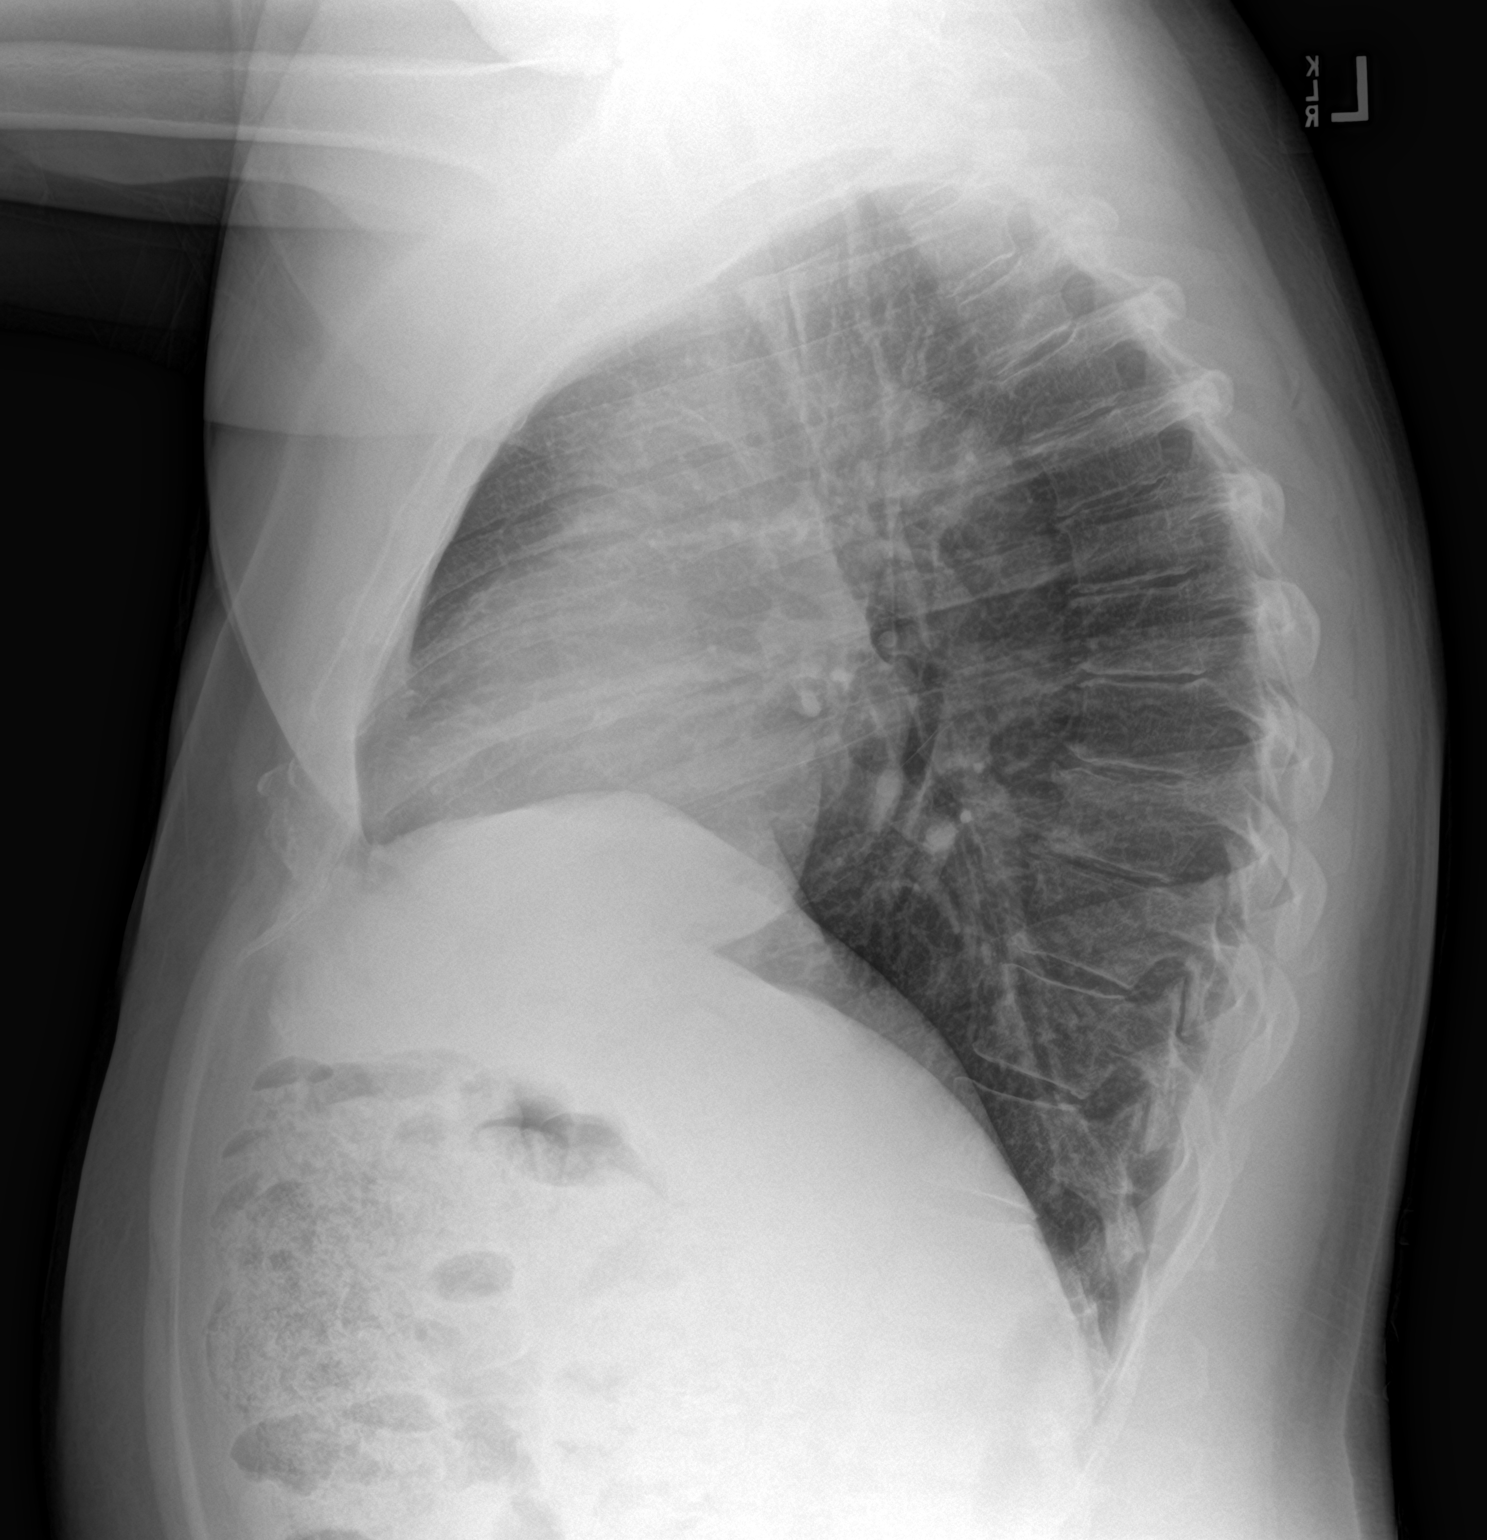

[2 of 2 positions shown; findings below may reference images not displayed]

FINDINGS: The heart size and mediastinal contours are within normal limits.
Both lungs are clear. The visualized skeletal structures are
unremarkable.
IMPRESSION: Normal exam.

## 2021-03-02 ENCOUNTER — Encounter (HOSPITAL_BASED_OUTPATIENT_CLINIC_OR_DEPARTMENT_OTHER): Payer: Self-pay

## 2021-03-02 DIAGNOSIS — R002 Palpitations: Secondary | ICD-10-CM

## 2021-03-05 MED ORDER — METOPROLOL SUCCINATE ER 25 MG PO TB24
37.5000 mg | ORAL_TABLET | Freq: Every day | ORAL | 0 refills | Status: DC
Start: 2021-03-05 — End: 2021-03-06

## 2021-03-05 MED ORDER — METOPROLOL SUCCINATE ER 25 MG PO TB24: ORAL_TABLET | ORAL | 3 refills | Status: DC

## 2021-03-05 NOTE — Telephone Encounter (Signed)
Please advise 

## 2021-03-06 ENCOUNTER — Encounter (HOSPITAL_BASED_OUTPATIENT_CLINIC_OR_DEPARTMENT_OTHER): Payer: Self-pay

## 2021-03-06 DIAGNOSIS — R002 Palpitations: Secondary | ICD-10-CM

## 2021-03-06 MED ORDER — METOPROLOL SUCCINATE ER 25 MG PO TB24: ORAL_TABLET | ORAL | 0 refills | Status: DC

## 2021-03-06 MED ORDER — METOPROLOL SUCCINATE ER 25 MG PO TB24: ORAL_TABLET | ORAL | 3 refills | Status: DC

## 2021-03-06 NOTE — Telephone Encounter (Signed)
See duplicate MyChart encounter. Issue resolved. Metoprolol previously sent to local and mail order pharmacy.   Loel Dubonnet, NP

## 2021-03-06 NOTE — Addendum Note (Signed)
Addended by: Loel Dubonnet on: 03/06/2021 07:46 AM   Modules accepted: Orders

## 2021-03-08 ENCOUNTER — Encounter: Payer: Self-pay | Admitting: Physical Medicine & Rehabilitation

## 2021-03-08 ENCOUNTER — Other Ambulatory Visit: Payer: Self-pay | Admitting: Physical Medicine & Rehabilitation

## 2021-03-08 ENCOUNTER — Ambulatory Visit (INDEPENDENT_AMBULATORY_CARE_PROVIDER_SITE_OTHER): Payer: BC Managed Care – PPO | Admitting: Psychologist

## 2021-03-08 DIAGNOSIS — G609 Hereditary and idiopathic neuropathy, unspecified: Secondary | ICD-10-CM

## 2021-03-08 DIAGNOSIS — F411 Generalized anxiety disorder: Secondary | ICD-10-CM

## 2021-03-08 DIAGNOSIS — L4050 Arthropathic psoriasis, unspecified: Secondary | ICD-10-CM

## 2021-03-08 DIAGNOSIS — M5416 Radiculopathy, lumbar region: Secondary | ICD-10-CM

## 2021-03-08 DIAGNOSIS — F331 Major depressive disorder, recurrent, moderate: Secondary | ICD-10-CM

## 2021-03-08 DIAGNOSIS — G894 Chronic pain syndrome: Secondary | ICD-10-CM

## 2021-03-08 MED ORDER — OXYCODONE HCL 10 MG PO TABS
10.0000 mg | ORAL_TABLET | Freq: Three times a day (TID) | ORAL | 0 refills | Status: DC | PRN
Start: 2021-03-08 — End: 2021-04-03

## 2021-03-08 MED ORDER — MORPHINE SULFATE ER 30 MG PO CP24: ORAL_CAPSULE | ORAL | 0 refills | Status: DC

## 2021-03-08 MED ORDER — MORPHINE SULFATE ER 20 MG PO CP24: ORAL_CAPSULE | ORAL | 0 refills | Status: DC

## 2021-03-08 NOTE — Progress Notes (Signed)
[facility] Counselor/Therapist Progress Note  Patient ID: Noha Karasik, MRN: [id number],    Date: 03/08/2021  Time Spent: 1:01 pm to 1:50 pm; total time: 49 minutes   This session was held via video webex teletherapy due to the coronavirus risk at this time. The patient consented to video teletherapy and was located at his home during this session. He is aware it is the responsibility of the patient to secure confidentiality on his end of the session. The provider was in a private home office for the duration of this session. Limits of confidentiality were discussed with the patient.   Treatment Type: Individual Therapy  Reported Symptoms: Anxiety and depressive based symptoms  Mental Status Exam: Appearance:  Casual     Behavior: Appropriate  Motor: Normal  Speech/Language:  Normal Rate  Affect: Appropriate  Mood: normal  Thought process: normal  Thought content:   WNL  Sensory/Perceptual disturbances:   WNL  Orientation: oriented to person, place, and time/date  Attention: Good  Concentration: Good  Memory: WNL  Fund of knowledge:  Good  Insight:   Fair  Judgment:  Fair  Impulse Control: Good   Risk Assessment: Danger to Self:  No Self-injurious Behavior: No Danger to Others: No Duty to Warn:no Physical Aggression / Violence:No  Access to Firearms a concern: No  Gang Involvement:No   Subjective: Patient began the session by indicating that December was a very difficult month for him due to some procedures that went poorly. From there, he spent the session acknowledging that he needed a different approach to counseling. Specifically, patient stated that he needs a slower pace and that he needs to focus on one area of his life. He processed thoughts and emotions. He asked to follow up. He denied suicidal and homicidal ideation.    Interventions:  Worked on developing a therapeutic relationship with the patient. Used active listening and reflective statements.  Provided emotional support using empathy and validation. Reviewed events since the last session. Reviewed the therapeutic relationship and what had been occurring between therapist and patient. Disclosed that it felt like therapist was working harder than the patient in therapy. Processed expressed thoughts and emotions. Used socratic questions to assist the patient. Challenged some of the patient's perceptions of therapy. Processed patient's needs in therapy. Provided empathic statements. Assessed for suicidal and homicidal ideation.  Homework: Patient will come up with a list of what he wants to discuss  Next Session: Review homework and emotional support  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 disorder, recurrent, moderate   Plan:   Goals Alleviate depressive symptoms Recognize, accept, and cope with depressive feelings Develop healthy thinking patterns Develop healthy interpersonal relationships Reduce overall frequency, intensity, and duration of anxiety Stabilize anxiety level wile increasing ability to function Enhance ability to effectively cope with full variety of stressors Learn and implement coping skills that result in a reduction of anxiety   Objectives target date for all objectives is: 09/26/2021 Verbalize an understanding of the cognitive, physiological, and behavioral components of anxiety Learning and implement calming skills to reduce overall anxiety Verbalize an understanding of the role that cognitive biases play in excessive irrational worry and persistent anxiety symptoms Identify, challenge, and replace based fearful talk Learn and implement problem solving strategies Identify and engage in pleasant activities Learning and implement personal and interpersonal skills to reduce anxiety and improve interpersonal relationships Learn to accept limitations in life and commit to tolerating, rather than avoiding, unpleasant  emotions while  accomplishing meaningful goals Identify major life conflicts from the past and present that form the basis for present anxiety Maintain involvement in work, family, and social activities Reestablish a consistent sleep-wake cycle Cooperate with a medical evaluation  Cooperate with a medication evaluation by a physician Verbalize an accurate understanding of depression Verbalize an understanding of the treatment Identify and replace thoughts that support depression Learn and implement behavioral strategies Verbalize an understanding and resolution of current interpersonal problems Learn and implement problem solving and decision making skills Learn and implement conflict resolution skills to resolve interpersonal problems Verbalize an understanding of healthy and unhealthy emotions verbalize insight into how past relationships may be influence current experiences with depression Use mindfulness and acceptance strategies and increase value based behavior  Increase hopeful statements about the future.  Interventions Engage the patient in behavioral activation Use instruction, modeling, and role-playing to build the client's general social, communication, and/or conflict resolution skills Use Acceptance and Commitment Therapy to help client accept uncomfortable realities in order to accomplish value-consistent goals Reinforce the client's insight into the role of his/her past emotional pain and present anxiety  Support the client in following through with work, family, and social activities Teach and implement sleep hygiene practices  Refer the patient to a physician for a psychotropic medication consultation Monito the clint's psychotropic medication compliance Discuss how anxiety typically involves excessive worry, various bodily expressions of tension, and avoidance of what is threatening that interact to maintain the problem  Teach the patient relaxation skills Assign the  patient homework Discuss examples demonstrating that unrealistic worry overestimates the probability of threats and underestimates patient's ability  Assist the patient in analyzing his or her worries Help patient understand that avoidance is reinforcing  Consistent with treatment model, discuss how change in cognitive, behavioral, and interpersonal can help client alleviate depression CBT Behavioral activation help the client explore the relationship, nature of the dispute,  Help the client develop new interpersonal skills and relationships Conduct Problem solving therapy Teach conflict resolution skills Use a process-experiential approach Conduct TLDP Conduct ACT Evaluate need for psychotropic medication Monitor adherence to medication   Patient agreed and reviewed the treatment plan on 10/03/2020  Conception Chancy, PsyD

## 2021-03-08 NOTE — Telephone Encounter (Signed)
Oxycodone 42m #85 filled

## 2021-03-14 ENCOUNTER — Other Ambulatory Visit: Payer: Self-pay | Admitting: Hematology

## 2021-03-14 DIAGNOSIS — D751 Secondary polycythemia: Secondary | ICD-10-CM

## 2021-03-15 ENCOUNTER — Ambulatory Visit: Payer: BC Managed Care – PPO | Admitting: Psychologist

## 2021-03-20 ENCOUNTER — Ambulatory Visit: Payer: BC Managed Care – PPO | Admitting: Psychologist

## 2021-03-26 ENCOUNTER — Encounter: Payer: Self-pay | Admitting: Cardiovascular Disease

## 2021-03-29 ENCOUNTER — Ambulatory Visit (INDEPENDENT_AMBULATORY_CARE_PROVIDER_SITE_OTHER): Payer: BC Managed Care – PPO | Admitting: Psychologist

## 2021-03-29 DIAGNOSIS — F411 Generalized anxiety disorder: Secondary | ICD-10-CM | POA: Diagnosis not present

## 2021-03-29 DIAGNOSIS — F331 Major depressive disorder, recurrent, moderate: Secondary | ICD-10-CM

## 2021-03-29 NOTE — Progress Notes (Signed)
[facility] Counselor/Therapist Progress Note  Patient ID: Jesse Bowers, MRN: [id number],    Date: 03/29/2021  Time Spent: 10:04 am to 10:44 am; total time: 40 minutes   This session was held via video webex teletherapy due to the coronavirus risk at this time. The patient consented to video teletherapy and was located at his home during this session. He is aware it is the responsibility of the patient to secure confidentiality on his end of the session. The provider was in a private home office for the duration of this session. Limits of confidentiality were discussed with the patient.   Treatment Type: Individual Therapy  Reported Symptoms: Anxiety and depressive based symptoms  Mental Status Exam: Appearance:  Casual     Behavior: Appropriate  Motor: Normal  Speech/Language:  Normal Rate  Affect: Appropriate  Mood: normal  Thought process: normal  Thought content:   WNL  Sensory/Perceptual disturbances:   WNL  Orientation: oriented to person, place, and time/date  Attention: Good  Concentration: Good  Memory: WNL  Fund of knowledge:  Good  Insight:   Fair  Judgment:  Fair  Impulse Control: Good   Risk Assessment: Danger to Self:  No Self-injurious Behavior: No Danger to Others: No Duty to Warn:no Physical Aggression / Violence:No  Access to Firearms a concern: No  Gang Involvement:No   Subjective: Patient began the session  stating that he had experienced both good and bad days. From there, he stated that he wanted to prepare for the weekend. Elaborating, he voiced frustration at the lack of being able to connect with spouse and attempted to come up with ways to address that concern. He processed thoughts and emotions associated with that idea. He ended the session by acknowledging that he potentially needs to focus on areas that relate more closely to him than to that of another person and whether or not the other person will follow through. He asked to  follow up. He denied suicidal and homicidal ideation.    Interventions:  Worked on developing a therapeutic relationship with the patient. Used active listening and reflective statements. Provided emotional support using empathy and validation. Used summary statements. Praised patient for doing slightly better. Identified goal for the session. Used socratic questions to assist the patient. Used metaphors of quick sand and bar soap to illustrate how control may exacerbate anxiety symptoms. Explored and processed what values are important to the patient in regarding to socialization versus spending time with spouse. Attempted to assist with problem solving to address specific goal of being with spouse. Processed expressed thoughts and emotions. Processed the idea of exploring values and what values are important to the patient that patient can target specific SMART goals instead of relying on someone else. Discussed next steps for counseling. Provided empathic statements. Assessed for suicidal and homicidal ideation.  Homework: Patient will review and identify values  Next Session: Review homework and emotional support  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 disorder, recurrent, moderate   Plan:   Goals Alleviate depressive symptoms Recognize, accept, and cope with depressive feelings Develop healthy thinking patterns Develop healthy interpersonal relationships Reduce overall frequency, intensity, and duration of anxiety Stabilize anxiety level wile increasing ability to function Enhance ability to effectively cope with full variety of stressors Learn and implement coping skills that result in a reduction of anxiety   Objectives target date for all objectives is: 09/26/2021 Verbalize an understanding of the cognitive, physiological, and behavioral components of anxiety Learning  and implement calming skills to reduce overall anxiety Verbalize an  understanding of the role that cognitive biases play in excessive irrational worry and persistent anxiety symptoms Identify, challenge, and replace based fearful talk Learn and implement problem solving strategies Identify and engage in pleasant activities Learning and implement personal and interpersonal skills to reduce anxiety and improve interpersonal relationships Learn to accept limitations in life and commit to tolerating, rather than avoiding, unpleasant emotions while accomplishing meaningful goals Identify major life conflicts from the past and present that form the basis for present anxiety Maintain involvement in work, family, and social activities Reestablish a consistent sleep-wake cycle Cooperate with a medical evaluation  Cooperate with a medication evaluation by a physician Verbalize an accurate understanding of depression Verbalize an understanding of the treatment Identify and replace thoughts that support depression Learn and implement behavioral strategies Verbalize an understanding and resolution of current interpersonal problems Learn and implement problem solving and decision making skills Learn and implement conflict resolution skills to resolve interpersonal problems Verbalize an understanding of healthy and unhealthy emotions verbalize insight into how past relationships may be influence current experiences with depression Use mindfulness and acceptance strategies and increase value based behavior  Increase hopeful statements about the future.  Interventions Engage the patient in behavioral activation Use instruction, modeling, and role-playing to build the client's general social, communication, and/or conflict resolution skills Use Acceptance and Commitment Therapy to help client accept uncomfortable realities in order to accomplish value-consistent goals Reinforce the client's insight into the role of his/her past emotional pain and present anxiety  Support the  client in following through with work, family, and social activities Teach and implement sleep hygiene practices  Refer the patient to a physician for a psychotropic medication consultation Monito the clint's psychotropic medication compliance Discuss how anxiety typically involves excessive worry, various bodily expressions of tension, and avoidance of what is threatening that interact to maintain the problem  Teach the patient relaxation skills Assign the patient homework Discuss examples demonstrating that unrealistic worry overestimates the probability of threats and underestimates patient's ability  Assist the patient in analyzing his or her worries Help patient understand that avoidance is reinforcing  Consistent with treatment model, discuss how change in cognitive, behavioral, and interpersonal can help client alleviate depression CBT Behavioral activation help the client explore the relationship, nature of the dispute,  Help the client develop new interpersonal skills and relationships Conduct Problem solving therapy Teach conflict resolution skills Use a process-experiential approach Conduct TLDP Conduct ACT Evaluate need for psychotropic medication Monitor adherence to medication   Patient agreed and reviewed the treatment plan on 10/03/2020  Conception Chancy, PsyD                  Conception Chancy, PsyD

## 2021-03-31 ENCOUNTER — Other Ambulatory Visit: Payer: Self-pay | Admitting: Physical Medicine & Rehabilitation

## 2021-04-03 ENCOUNTER — Encounter: Payer: Self-pay | Admitting: Physical Medicine & Rehabilitation

## 2021-04-03 ENCOUNTER — Other Ambulatory Visit: Payer: Self-pay

## 2021-04-03 ENCOUNTER — Encounter
Payer: BC Managed Care – PPO | Attending: Physical Medicine & Rehabilitation | Admitting: Physical Medicine & Rehabilitation

## 2021-04-03 VITALS — BP 122/82 | HR 94 | Ht 69.0 in | Wt 207.0 lb

## 2021-04-03 DIAGNOSIS — M47816 Spondylosis without myelopathy or radiculopathy, lumbar region: Secondary | ICD-10-CM | POA: Insufficient documentation

## 2021-04-03 DIAGNOSIS — M5416 Radiculopathy, lumbar region: Secondary | ICD-10-CM | POA: Diagnosis present

## 2021-04-03 DIAGNOSIS — G894 Chronic pain syndrome: Secondary | ICD-10-CM | POA: Insufficient documentation

## 2021-04-03 DIAGNOSIS — G609 Hereditary and idiopathic neuropathy, unspecified: Secondary | ICD-10-CM | POA: Insufficient documentation

## 2021-04-03 DIAGNOSIS — Z79891 Long term (current) use of opiate analgesic: Secondary | ICD-10-CM | POA: Insufficient documentation

## 2021-04-03 DIAGNOSIS — Z5181 Encounter for therapeutic drug level monitoring: Secondary | ICD-10-CM | POA: Insufficient documentation

## 2021-04-03 DIAGNOSIS — L4050 Arthropathic psoriasis, unspecified: Secondary | ICD-10-CM | POA: Diagnosis present

## 2021-04-03 MED ORDER — OXYCODONE HCL 10 MG PO TABS: 10.0000 mg | ORAL_TABLET | Freq: Three times a day (TID) | ORAL | 0 refills | Status: DC | PRN

## 2021-04-03 MED ORDER — MORPHINE SULFATE ER 30 MG PO CP24: ORAL_CAPSULE | ORAL | 0 refills | Status: DC

## 2021-04-03 MED ORDER — MORPHINE SULFATE ER 20 MG PO CP24: ORAL_CAPSULE | ORAL | 0 refills | Status: DC

## 2021-04-03 MED ORDER — BACLOFEN 20 MG PO TABS: 20.0000 mg | ORAL_TABLET | Freq: Every day | ORAL | 3 refills | Status: DC

## 2021-04-03 NOTE — Progress Notes (Signed)
Subjective:    Patient ID: Jesse Bowers, adult    DOB: [birth year], 56 y.o.   MRN: [id number]  HPI Jesse Bowers is here in follow-up of Jesse Bowers chronic pain.  Since I last saw him he has had Jesse Bowers gender reconstructive/plastic surgery.  He a lot of pain initially but that seems to be starting to improve. He tore some sutures apparently which will require a surgical revision.   We continued Jesse Bowers oxycodone 71m in addition is kadian 579mbid.  He's having more pain in Jesse Bowers feet as well as Jesse Bowers neck/scalp related to Jesse Bowers psoriasis. He also has developed some pain in Jesse Bowers low back musculature as he's been straining to empty with Jesse Bowers reformed urethra and essentially has been trying to relearn to urinate.     Pain Inventory Average Pain  7-8 Pain Right Now 6 My pain is constant, sharp, burning, tingling, and aching  LOCATION OF PAIN  neck, elbow, hand, fingers, back, groin, ankle, toes  BOWEL Number of stools per week: 7 Oral laxative use Yes  Type of laxative Miralax, Linzess  BLADDER Normal In and out cath, frequency n/a Able to self cath No  Bladder incontinence No  Frequent urination Yes  Leakage with coughing No  Difficulty starting stream No  Incomplete bladder emptying No    Mobility how many minutes can you walk? 5-10 minutes ability to climb steps?  yes do you drive?  yes use a wheelchair transfers alone Do you have any goals in this area?  yes  Function disabled: date disabled 2014 I need assistance with the following:  meal prep, household duties, and shopping Do you have any goals in this area?  yes  Neuro/Psych weakness numbness tremor tingling trouble walking spasms depression anxiety loss of taste or smell  Prior Studies Any changes since last visit?  [place] [facility] Any changes since last visit?  [place] Family History  Problem Relation Age of Onset   Stroke  Maternal Grandfather        59   Heart attack Maternal Grandfather    Glaucoma Maternal Grandfather    Macular degeneration Maternal Grandfather    Breast cancer Sister    Hypertension Mother    Psoriasis Mother    Other Mother        meningioma developed ~2019   Glaucoma Mother    Cancer Paternal Grandfather    Heart attack Paternal Grandfather    Stroke Paternal Uncle        age 56 Polycythemia Paternal Uncle    Stroke Maternal Grandmother    Congestive Heart Failure Maternal Grandmother    Heart attack Maternal Grandmother    Protein C deficiency Sister [id number]     Miscarriages   Breast cancer Maternal Aunt [id number] Social History   Socioeconomic History   Marital status: Married    Spouse name: Not on file   Number of children: 2   Years of education: 4y college   Highest education level: Not on file  Occupational History   Occupation: Pediatric Nurse practitioner    Comment: Not working since CVA 2015  Tobacco Use   Smoking status: Never   Smokeless tobacco: Never  Vaping Use   Vaping Use: Never used  Substance and Sexual Activity   Alcohol use: Yes    Comment: social   Drug use: No  Sexual activity: Yes    Birth control/protection: None    Comment: patient is a transgender on testosterone shots, no biological kids  Other Topics Concern   Not on file  Social History Narrative   Education 4 year college, former Therapist, sports X 15 years, pediatric nurse practitioner x 6 years, did NP degree from [employer] [place]. Relocated to [place] about 2 months ago from [place]. Patient was in [place] for last 4 years and prior to that in [place]. Jesse Bowers wife is working as Scientist, research (physical sciences) for [employer]. Patient is not working and applying for disability. They have 2 kids but no biologic children.    Social Determinants of Health   Financial Resource Strain: Not on file  Food Insecurity: Not on file  Transportation Needs: Not on file  Physical Activity: Not on file  Stress:  Not on file  Social Connections: Not on file   Past Surgical History:  Procedure Laterality Date   ABDOMINAL HYSTERECTOMY Bilateral 1994   TAH, BSO- tranverse incision at 56 yo   ANKLE ARTHROSCOPY WITH RECONSTRUCTION Right 2007   CHOLECYSTECTOMY     laparoscopic   COLONOSCOPY     x3   EYE SURGERY     Left eye 03/02/2018, right 02/15/2018   HIP ARTHROSCOPY W/ LABRAL REPAIR Right 05/11/2013   acetabular labral tear 03/30/2013   KNEE ARTHROPLASTY Right    KNEE JOINT MANIPULATION Left    x3 under anesthesia   KNEE SURGERY Bilateral 1984   Right ACL, left PCL repair   LITHOTRIPSY  2005   LIVER BIOPSY  2013   normal results.   MASTECTOMY Bilateral    prior to 2009   MOUTH SURGERY     NASAL SEPTUM SURGERY N/A 09/20/2015   by ENT Dr. [name]   OVARIAN CYST SURGERY Left    size of grapefruit, was informed that she had shortened vagina   SHOULDER SURGERY Bilateral    Right 08/15/2016, Left 11/15/2016   THUMB ARTHROSCOPY Left    THYROIDECTOMY, PARTIAL Left 2008   TOTAL KNEE ARTHROPLASTY Right 08/23/2018   Procedure: TOTAL KNEE ARTHROPLASTY;  Surgeon: [name], MD;  Location: [facility];  Service: Orthopedics;  Laterality: Right;  82mn   TOTAL KNEE REVISION Left 02/06/2016   Procedure: LEFT TOTAL KNEE REVISION;  Surgeon: FGaynelle Arabian MD;  Location: [facility];  Service: Orthopedics;  Laterality: Left;   TOTAL KNEE REVISION Left 04/22/2017   Procedure: Left knee polyethylene revision;  Surgeon: AGaynelle Arabian MD;  Location: [facility];  Service: Orthopedics;  Laterality: Left;   UPPER GI ENDOSCOPY  2003   Past Medical History:  Diagnosis Date   Abnormal weight loss    Anxiety    Arthritis    Cataract    OU   Celiac disease    Cervical neck pain with evidence of disc disease    patient has a cyst    Chronic constipation    Chronic diastolic heart failure (HCC)    Pt. denies   Chronic pain    Degenerative disc disease at L5-S1 level    with stenosis   DVT (deep venous thrombosis)  (HCC)    Right upper arm, bilateral leg   Eczema    inguinal, feet   Elevated liver enzymes    Failed total knee arthroplasty ([place] 04/22[place]   Family history of adverse reaction to anesthesia    family has problems with anesthesia of nausea and vomiting    Male-to-male transgender person    GERD (  gastroesophageal reflux disease)    History of in 20's   Gluten enteropathy    H/O parotitis    right    Hard of hearing    History of kidney stones    History of retinal tear    Bilateral   History of staph infection    required wound vac   Hx-TIA (transient ischemic attack)    2015   Kidney stones 08/2020   LVH (left ventricular hypertrophy) 12/15/2016   Mild, noted on ECHO   MVP (mitral valve prolapse)    NAFL (nonalcoholic fatty liver)    Neck pain    Neuromuscular disorder (HCC)    bilateral neuropathy feet.   Nuclear sclerotic cataract of both eyes 09/08/2019   Pneumonia 12/17/2010   Polycythemia    Polycythemia, secondary    PONV (postoperative nausea and vomiting)    Protein C deficiency (HCC)    Dr. Anne Fu   Psoriasis    16 X10 cm psoriatic rash on sole of left foot ; open and occ scant bleeding;    psoriatic arthritis    PTSD (post-traumatic stress disorder)    Scaphoid fracture of wrist 09/23/2013   Seizure ([place])    childhood, medication until age 31 then weanned completely off   Sleep apnea    split night study last done by Dr. [name] 06/18/15 shows severe OSA, CSA, and hypersomnia, rec bipap   Splenomegaly    Stenosis of ureteropelvic junction (UPJ)    left   Stroke [facility])    CVA vs TIA in left cerebrum causing slight right sided weakness-Dr. [name] follows   Syrinx of spinal cord ([place]) 01/06/2014   c spine on MRI   Tachycardia    hx of    Transfusion history    past history- none recent, after surgeries due to blood loss   Wears glasses    Wears hearing aid    BP 122/82    Pulse 94    Ht 5' 9"  (1.753 m)    Wt 207 lb (93.9 kg)    SpO2 98%    BMI  30.57 kg/m   Opioid Risk Score:   Fall Risk Score:  `1  Depression screen PHQ 2/9  Depression screen [facility] 2/9 01/09/2021 10/03/2020 03/21/2020  Decreased Interest 0 0 0  Down, Depressed, Hopeless 0 0 0  PHQ - 2 Score 0 0 0  Some recent data might be hidden    Review of Systems  Constitutional:  Positive for appetite change.  Respiratory:  Positive for shortness of breath.   Gastrointestinal:  Positive for constipation.  Musculoskeletal:  Positive for gait problem.       Spasms Limb swelling  Skin:  Positive for rash.  Neurological:  Positive for numbness.       Anxiety, depression      Objective:   Physical Exam   General: No acute distress HEENT: NCAT, EOMI, oral membranes moist Cards: reg rate  Chest: normal effort Abdomen: Soft, NT, ND Skin: dry, intact Extremities: no edema Psych: pleasant and appropriate    Neuro: Pt is pleasant and alert and oriented x 3.   Strength is grossly 3-4/5 based on pain, splint.   reflexies are hyperactive. patchy sensory loss in legs.. Musculoskeletal: low back limited ROM. In wc      Assessment & Plan:   1. Psoriatic arthritis with pain in multiple areas, most prominently feet, hands, elbows. Jesse Bowers pain is also related to Jesse Bowers prior CVA and associated motor/sensory change  2. 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 3. Hx of left total knee revision again on 04/21/17 per [facility] and right TKA 08/2018 4. Chronic  low back pain---MRI with severe DDD at L5-S1. Left S1 radiculopathy?  -s/p ESI 12/21/20  5. Protein C deficiency   6. Left shoulder subluxation, bicipital tendonitis 7. Central sleep apnea 8. Polycythemia 9. Depression with anxiety.  0. Left lateral epidondylitis  11. 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  left sided weakness can fluctuate. Most recent cervical MRI (07/08/17) without significant change 12.  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 [place] 14. Right wrist psoriatic arthritis, tendonapthy, ganglion cysts s/p multiple recent surgeries.  s/p right thumb replacement 15. Recent gender reconstruction surgery     Plan:  1. Continue venlafaxine per pyschiatry 2.  might benefit from a short course of PT for Jesse Bowers back and to strengthen Jesse Bowers lower extremities and core. Perhaps a gym/trainer is option 3. Voltaren gel to hands, feet, knees, elbows--- continue 4. Baclofen for lower extermity spasms/back pain. increase to 16m 5x daily, taking 366min morning and at bedtime 5. refilled kadian [id number] #60 and oxycodone [id number]    -plan will ultimately to move Jesse Bowers oxycodone down again -We will continue the controlled substance monitoring program, this consists of regular clinic visits, examinations, routine drug screening, pill counts as well [place] use [place].   -Medication was refilled and a second prescription was sent to the patient's pharmacy for next month.             7. fair result with latest ESI        -consider f/u ESI 8: OI constipation  -linzess    15 minutes of face to face patient care time were spent during this visit. All questions were encouraged and answered.  Follow up with NP in 2 mos .

## 2021-04-03 NOTE — Patient Instructions (Signed)
PLEASE FEEL FREE TO CALL OUR OFFICE WITH ANY PROBLEMS OR QUESTIONS ([phone])      

## 2021-04-04 ENCOUNTER — Encounter (HOSPITAL_BASED_OUTPATIENT_CLINIC_OR_DEPARTMENT_OTHER): Payer: Self-pay

## 2021-04-04 DIAGNOSIS — I5032 Chronic diastolic (congestive) heart failure: Secondary | ICD-10-CM

## 2021-04-04 MED ORDER — METOPROLOL SUCCINATE ER 25 MG PO TB24: ORAL_TABLET | ORAL | 5 refills | Status: DC

## 2021-04-04 MED ORDER — FUROSEMIDE 20 MG PO TABS: 20.0000 mg | ORAL_TABLET | Freq: Every day | ORAL | 5 refills | Status: DC

## 2021-04-04 NOTE — Telephone Encounter (Signed)
You okay with dose change and new refills sent?

## 2021-04-05 ENCOUNTER — Ambulatory Visit (INDEPENDENT_AMBULATORY_CARE_PROVIDER_SITE_OTHER): Payer: BC Managed Care – PPO | Admitting: Psychologist

## 2021-04-05 ENCOUNTER — Encounter (HOSPITAL_BASED_OUTPATIENT_CLINIC_OR_DEPARTMENT_OTHER): Payer: Self-pay

## 2021-04-05 ENCOUNTER — Telehealth (HOSPITAL_BASED_OUTPATIENT_CLINIC_OR_DEPARTMENT_OTHER): Payer: Self-pay

## 2021-04-05 DIAGNOSIS — F331 Major depressive disorder, recurrent, moderate: Secondary | ICD-10-CM

## 2021-04-05 DIAGNOSIS — F411 Generalized anxiety disorder: Secondary | ICD-10-CM

## 2021-04-05 DIAGNOSIS — I5032 Chronic diastolic (congestive) heart failure: Secondary | ICD-10-CM

## 2021-04-05 MED ORDER — FUROSEMIDE 20 MG PO TABS: 20.0000 mg | ORAL_TABLET | ORAL | 2 refills | Status: DC | PRN

## 2021-04-05 MED ORDER — FUROSEMIDE 40 MG PO TABS: 40.0000 mg | ORAL_TABLET | Freq: Every day | ORAL | 2 refills | Status: DC

## 2021-04-05 MED ORDER — FUROSEMIDE 20 MG PO TABS: 20.0000 mg | ORAL_TABLET | Freq: Every day | ORAL | 2 refills | Status: DC

## 2021-04-05 NOTE — Telephone Encounter (Addendum)
Called Patient to clarify what dose he is taking daily. He is taking 60 daily ; Wants 40m for extra   Consulted with CLaurann [place] NP for final orders and sent medications into preferred pharmacy!     Rn also called the pharmacy to clarify orders!

## 2021-04-05 NOTE — Progress Notes (Signed)
[place] Counselor/Therapist Progress Note  Patient ID: Jesse Bowers, MRN: [id number],    Date: 04/05/2021  Time Spent: 1:07 pm to 1:45 pm total time: 38 minutes   This session was held via video webex teletherapy due to the coronavirus risk at this time. The patient consented to video teletherapy and was located at his home during this session. He is aware it is the responsibility of the patient to secure confidentiality on his end of the session. The provider was in a private home office for the duration of this session. Limits of confidentiality were discussed with the patient.   Treatment Type: Individual Therapy  Reported Symptoms: Anxiety and depressive based symptoms  Mental Status Exam: Appearance:  Casual     Behavior: Appropriate  Motor: Normal  Speech/Language:  Normal Rate  Affect: Appropriate  Mood: normal  Thought process: normal  Thought content:   WNL  Sensory/Perceptual disturbances:   WNL  Orientation: oriented to person, place, and time/date  Attention: Good  Concentration: Good  Memory: WNL  Fund of knowledge:  Good  Insight:   Fair  Judgment:  Fair  Impulse Control: Good   Risk Assessment: Danger to Self:  No Self-injurious Behavior: No Danger to Others: No Duty to Warn:no Physical Aggression / Violence:No  Access to Firearms a concern: No  Gang Involvement:No   Subjective: Patient began the session reflecting on the homework describing at has helpful. He then quickly talked about events since the last session before returning to the completed assignment. From there, he identified values most important to him. Elaborating, he identified with the value of self-acceptance and briefly explored how he believed that the transition from male to male would help with that acceptance only to acknowledge experiencing some barriers. He processed thoughts and emotions surrounding this idea. He asked to follow up. He denied suicidal and homicidal  ideation.    Interventions:  Worked on developing a therapeutic relationship with the patient. Used active listening and reflective statements. Provided emotional support using empathy and validation. Praised patient for doing well. Normalized and validation patient's experience with the value card sort. Processed expressed thoughts and emotions associated with the sort. Used socratic questions to assist the patient gain insight into self. Processed how values change depending on different phases of life. Processed the values most meaningful to the patient. Identified the theme of self-acceptance and explored what it meant to the patient. Discussed next steps in counseling. Provided empathic statements. Assessed for suicidal and homicidal ideation.  Homework: Patient reflect on values  Next Session: Review value of self-acceptance and discuss making a SMART goal with that value in mind  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 disorder, recurrent, moderate   Plan:   Goals Alleviate depressive symptoms Recognize, accept, and cope with depressive feelings Develop healthy thinking patterns Develop healthy interpersonal relationships Reduce overall frequency, intensity, and duration of anxiety Stabilize anxiety level wile increasing ability to function Enhance ability to effectively cope with full variety of stressors Learn and implement coping skills that result in a reduction of anxiety   Objectives target date for all objectives is: 09/26/2021 Verbalize an understanding of the cognitive, physiological, and behavioral components of anxiety Learning and implement calming skills to reduce overall anxiety Verbalize an understanding of the role that cognitive biases play in excessive irrational worry and persistent anxiety symptoms Identify, challenge, and replace based fearful talk Learn and implement problem solving strategies Identify and engage in  pleasant activities Learning and  implement personal and interpersonal skills to reduce anxiety and improve interpersonal relationships Learn to accept limitations in life and commit to tolerating, rather than avoiding, unpleasant emotions while accomplishing meaningful goals Identify major life conflicts from the past and present that form the basis for present anxiety Maintain involvement in work, family, and social activities Reestablish a consistent sleep-wake cycle Cooperate with a medical evaluation  Cooperate with a medication evaluation by a physician Verbalize an accurate understanding of depression Verbalize an understanding of the treatment Identify and replace thoughts that support depression Learn and implement behavioral strategies Verbalize an understanding and resolution of current interpersonal problems Learn and implement problem solving and decision making skills Learn and implement conflict resolution skills to resolve interpersonal problems Verbalize an understanding of healthy and unhealthy emotions verbalize insight into how past relationships may be influence current experiences with depression Use mindfulness and acceptance strategies and increase value based behavior  Increase hopeful statements about the future.  Interventions Engage the patient in behavioral activation Use instruction, modeling, and role-playing to build the client's general social, communication, and/or conflict resolution skills Use Acceptance and Commitment Therapy to help client accept uncomfortable realities in order to accomplish value-consistent goals Reinforce the client's insight into the role of his/her past emotional pain and present anxiety  Support the client in following through with work, family, and social activities Teach and implement sleep hygiene practices  Refer the patient to a physician for a psychotropic medication consultation Monito the clint's psychotropic medication  compliance Discuss how anxiety typically involves excessive worry, various bodily expressions of tension, and avoidance of what is threatening that interact to maintain the problem  Teach the patient relaxation skills Assign the patient homework Discuss examples demonstrating that unrealistic worry overestimates the probability of threats and underestimates patient's ability  Assist the patient in analyzing his or her worries Help patient understand that avoidance is reinforcing  Consistent with treatment model, discuss how change in cognitive, behavioral, and interpersonal can help client alleviate depression CBT Behavioral activation help the client explore the relationship, nature of the dispute,  Help the client develop new interpersonal skills and relationships Conduct Problem solving therapy Teach conflict resolution skills Use a process-experiential approach Conduct TLDP Conduct ACT Evaluate need for psychotropic medication Monitor adherence to medication   Patient agreed and reviewed the treatment plan on 10/03/2020  Conception Chancy, PsyD                  Conception Chancy, PsyD               Conception Chancy, PsyD

## 2021-04-05 NOTE — Telephone Encounter (Signed)
Update on those meds for ya!

## 2021-04-09 ENCOUNTER — Telehealth: Payer: Self-pay | Admitting: *Deleted

## 2021-04-09 ENCOUNTER — Encounter (HOSPITAL_BASED_OUTPATIENT_CLINIC_OR_DEPARTMENT_OTHER): Payer: Self-pay

## 2021-04-09 LAB — DRUG TOX MONITOR 1 W/CONF, ORAL FLD
Alprazolam: NEGATIVE ng/mL (ref <0.50)
Amphetamines: NEGATIVE ng/mL (ref <10)
Barbiturates: NEGATIVE ng/mL (ref <10)
Benzodiazepines: POSITIVE ng/mL — AB (ref <0.50)
Buprenorphine: NEGATIVE ng/mL (ref <0.10)
Chlordiazepoxide: NEGATIVE ng/mL (ref <0.50)
Clonazepam: NEGATIVE ng/mL (ref <0.50)
Cocaine: NEGATIVE ng/mL (ref <5.0)
Codeine: NEGATIVE ng/mL (ref <2.5)
Diazepam: NEGATIVE ng/mL (ref <0.50)
Dihydrocodeine: NEGATIVE ng/mL (ref <2.5)
Fentanyl: NEGATIVE ng/mL (ref <0.10)
Flunitrazepam: NEGATIVE ng/mL (ref <0.50)
Flurazepam: NEGATIVE ng/mL (ref <0.50)
Heroin Metabolite: NEGATIVE ng/mL (ref <1.0)
Hydrocodone: NEGATIVE ng/mL (ref <2.5)
Hydromorphone: NEGATIVE ng/mL (ref <2.5)
Lorazepam: NEGATIVE ng/mL (ref <0.50)
MARIJUANA: NEGATIVE ng/mL (ref <2.5)
MDMA: NEGATIVE ng/mL (ref <10)
Meprobamate: NEGATIVE ng/mL (ref <2.5)
Methadone: NEGATIVE ng/mL (ref <5.0)
Midazolam: NEGATIVE ng/mL (ref <0.50)
Morphine: 37.6 ng/mL — ABNORMAL HIGH (ref <2.5)
Nicotine Metabolite: NEGATIVE ng/mL (ref <5.0)
Nordiazepam: 23.46 ng/mL — ABNORMAL HIGH (ref <0.50)
Norhydrocodone: NEGATIVE ng/mL (ref <2.5)
Noroxycodone: 23 ng/mL — ABNORMAL HIGH (ref <2.5)
Opiates: POSITIVE ng/mL — AB (ref <2.5)
Oxycodone: 138.9 ng/mL — ABNORMAL HIGH (ref <2.5)
Oxymorphone: 3.3 ng/mL — ABNORMAL HIGH (ref <2.5)
Phencyclidine: NEGATIVE ng/mL (ref <10)
Tapentadol: NEGATIVE ng/mL (ref <5.0)
Temazepam: NEGATIVE ng/mL (ref <0.50)
Tramadol: NEGATIVE ng/mL (ref <5.0)
Triazolam: NEGATIVE ng/mL (ref <0.50)
Zolpidem: NEGATIVE ng/mL (ref <5.0)

## 2021-04-09 LAB — DRUG TOX ALC METAB W/CON, ORAL FLD: Alcohol Metabolite: NEGATIVE ng/mL (ref <25)

## 2021-04-09 MED ORDER — FUROSEMIDE 20 MG PO TABS: 20.0000 mg | ORAL_TABLET | ORAL | 2 refills | Status: DC | PRN

## 2021-04-09 NOTE — Telephone Encounter (Signed)
Oral swab drug screen was consistent for prescribed medications.  ?

## 2021-04-10 MED ORDER — FUROSEMIDE 40 MG PO TABS: ORAL_TABLET | ORAL | 1 refills | Status: DC

## 2021-04-10 MED ORDER — FUROSEMIDE 80 MG PO TABS: 40.0000 mg | ORAL_TABLET | Freq: Every day | ORAL | 1 refills | Status: DC

## 2021-04-10 MED ORDER — FUROSEMIDE 20 MG PO TABS: 20.0000 mg | ORAL_TABLET | ORAL | 2 refills | Status: DC | PRN

## 2021-04-10 NOTE — Addendum Note (Signed)
Addended by: Loel Dubonnet on: 04/10/2021 10:16 AM   Modules accepted: Orders

## 2021-04-19 ENCOUNTER — Ambulatory Visit (INDEPENDENT_AMBULATORY_CARE_PROVIDER_SITE_OTHER): Payer: BC Managed Care – PPO | Admitting: Psychologist

## 2021-04-19 DIAGNOSIS — F411 Generalized anxiety disorder: Secondary | ICD-10-CM

## 2021-04-19 DIAGNOSIS — F331 Major depressive disorder, recurrent, moderate: Secondary | ICD-10-CM | POA: Diagnosis not present

## 2021-04-19 NOTE — Progress Notes (Signed)
[place] Counselor/Therapist Progress Note  Patient ID: Jesse Bowers, MRN: [id number],    Date: 04/19/2021  Time Spent: 1:02 pm to 1:45 pm; total time: 43 minutes   This session was held via video webex teletherapy due to the coronavirus risk at this time. The patient consented to video teletherapy and was located at his home during this session. He is aware it is the responsibility of the patient to secure confidentiality on his end of the session. The provider was in a private home office for the duration of this session. Limits of confidentiality were discussed with the patient.   Treatment Type: Individual Therapy  Reported Symptoms: Depressive symptoms.   Mental Status Exam: Appearance:  Casual     Behavior: Appropriate  Motor: Normal  Speech/Language:  Normal Rate  Affect: Appropriate  Mood: normal  Thought process: normal  Thought content:   WNL  Sensory/Perceptual disturbances:   WNL  Orientation: oriented to person, place, and time/date  Attention: Good  Concentration: Good  Memory: WNL  Fund of knowledge:  Good  Insight:   Fair  Judgment:  Fair  Impulse Control: Good   Risk Assessment: Danger to Self:  No Self-injurious Behavior: No Danger to Others: No Duty to Warn:no Physical Aggression / Violence:No  Access to Firearms a concern: No  Gang Involvement:No   Subjective: Patient began the session reflecting on recent events in his life. From there he reflected on what self-acceptance looks like to him. He processed thoughts related to this concept. From there, he then acknowledged that he has not allowed himself to grieve over what he can't do anymore. Patient ended the session by exploring the idea of focusing on what he can do instead of what he can't do. He asked to follow up. He denied suicidal and homicidal ideation.    Interventions:  Worked on developing a therapeutic relationship with the patient. Used active listening and reflective  statements. Provided emotional support using empathy and validation. Reflected on events since the last session. Praised patient for doing well. Reviewed the plans for the session. Explored the value of self-acceptance and what it looks like to the patient. Used socratic questions to assist the patient gain insight into self. Challenged some of the thoughts expressed. Identified the concept of focusing on limitations. Redirected the patient towards focusing on what he can do instead of can't do. Processed thoughts and emotions. Explored willingness to experience grief towards limitations. Processed how patient could grieve over the limitations. Assigned homework. Provided empathic statements. Assessed for suicidal and homicidal ideation.  Homework: Patient reflect on what grieving looks like to him; focus on what he can do. Focus on abilities  Next Session: Review homework and SMART goal forming. Emotional support  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 disorder, recurrent, moderate   Plan:   Goals Alleviate depressive symptoms Recognize, accept, and cope with depressive feelings Develop healthy thinking patterns Develop healthy interpersonal relationships Reduce overall frequency, intensity, and duration of anxiety Stabilize anxiety level wile increasing ability to function Enhance ability to effectively cope with full variety of stressors Learn and implement coping skills that result in a reduction of anxiety   Objectives target date for all objectives is: 09/26/2021 Verbalize an understanding of the cognitive, physiological, and behavioral components of anxiety Learning and implement calming skills to reduce overall anxiety Verbalize an understanding of the role that cognitive biases play in excessive irrational worry and persistent anxiety symptoms Identify, challenge, and replace based fearful  talk Learn and implement problem solving  strategies Identify and engage in pleasant activities Learning and implement personal and interpersonal skills to reduce anxiety and improve interpersonal relationships Learn to accept limitations in life and commit to tolerating, rather than avoiding, unpleasant emotions while accomplishing meaningful goals Identify major life conflicts from the past and present that form the basis for present anxiety Maintain involvement in work, family, and social activities Reestablish a consistent sleep-wake cycle Cooperate with a medical evaluation  Cooperate with a medication evaluation by a physician Verbalize an accurate understanding of depression Verbalize an understanding of the treatment Identify and replace thoughts that support depression Learn and implement behavioral strategies Verbalize an understanding and resolution of current interpersonal problems Learn and implement problem solving and decision making skills Learn and implement conflict resolution skills to resolve interpersonal problems Verbalize an understanding of healthy and unhealthy emotions verbalize insight into how past relationships may be influence current experiences with depression Use mindfulness and acceptance strategies and increase value based behavior  Increase hopeful statements about the future.  Interventions Engage the patient in behavioral activation Use instruction, modeling, and role-playing to build the client's general social, communication, and/or conflict resolution skills Use Acceptance and Commitment Therapy to help client accept uncomfortable realities in order to accomplish value-consistent goals Reinforce the client's insight into the role of his/her past emotional pain and present anxiety  Support the client in following through with work, family, and social activities Teach and implement sleep hygiene practices  Refer the patient to a physician for a psychotropic medication consultation Monito the  clint's psychotropic medication compliance Discuss how anxiety typically involves excessive worry, various bodily expressions of tension, and avoidance of what is threatening that interact to maintain the problem  Teach the patient relaxation skills Assign the patient homework Discuss examples demonstrating that unrealistic worry overestimates the probability of threats and underestimates patient's ability  Assist the patient in analyzing his or her worries Help patient understand that avoidance is reinforcing  Consistent with treatment model, discuss how change in cognitive, behavioral, and interpersonal can help client alleviate depression CBT Behavioral activation help the client explore the relationship, nature of the dispute,  Help the client develop new interpersonal skills and relationships Conduct Problem solving therapy Teach conflict resolution skills Use a process-experiential approach Conduct TLDP Conduct ACT Evaluate need for psychotropic medication Monitor adherence to medication   Patient agreed and reviewed the treatment plan on 10/03/2020  Conception Chancy, PsyD

## 2021-04-26 ENCOUNTER — Telehealth: Payer: Self-pay

## 2021-04-26 ENCOUNTER — Inpatient Hospital Stay: Payer: BC Managed Care – PPO | Admitting: Hematology

## 2021-04-26 ENCOUNTER — Inpatient Hospital Stay: Payer: BC Managed Care – PPO

## 2021-04-26 NOTE — Telephone Encounter (Signed)
LVM stating that Dr. Burr Medico would like to reschedule the pt's appointment today to 2 to 3 months from today since pt just was recently seen at [facility] on 04/08/2021 for labs and hem.  Dr. Burr Medico review those recent labs taken on 04/08/2021 and determined that the pt does not need a therapeutic phlebotomy.  Sent scheduling message to Allene Pyo to contact pt to have appointment for today rescheduled.  Also, sent MyChart message to pt as well stating this information.

## 2021-05-03 ENCOUNTER — Ambulatory Visit (INDEPENDENT_AMBULATORY_CARE_PROVIDER_SITE_OTHER): Payer: BC Managed Care – PPO | Admitting: Psychologist

## 2021-05-03 DIAGNOSIS — F331 Major depressive disorder, recurrent, moderate: Secondary | ICD-10-CM | POA: Diagnosis not present

## 2021-05-03 DIAGNOSIS — F411 Generalized anxiety disorder: Secondary | ICD-10-CM | POA: Diagnosis not present

## 2021-05-03 NOTE — Progress Notes (Signed)
[place] Counselor/Therapist Progress Note ? ?Patient ID: Jesse Bowers, MRN: [id number],   ? ?Date: 05/03/2021 ? ?Time Spent: 1:01 pm to 1:40 pm; total time: 39 minutes ? ? This session was held via video webex teletherapy due to the coronavirus risk at this time. The patient consented to video teletherapy and was located at his home during this session. He is aware it is the responsibility of the patient to secure confidentiality on his end of the session. The provider was in a private home office for the duration of this session. Limits of confidentiality were discussed with the patient.  ? ?Treatment Type: Individual Therapy ? ?Reported Symptoms: Depressive symptoms.  ? ?Mental Status Exam: ?Appearance:  Casual     ?Behavior: Appropriate  ?Motor: Normal  ?Speech/Language:  Normal Rate  ?Affect: Appropriate  ?Mood: normal  ?Thought process: normal  ?Thought content:   WNL  ?Sensory/Perceptual disturbances:   WNL  ?Orientation: oriented to person, place, and time/date  ?Attention: Good  ?Concentration: Good  ?Memory: WNL  ?Fund of knowledge:  Good  ?Insight:   Fair  ?Judgment:  Fair  ?Impulse Control: Good  ? ?Risk Assessment: ?Danger to Self:  No ?Self-injurious Behavior: No ?Danger to Others: No ?Duty to Warn:no ?Physical Aggression / Violence:No  ?Access to Firearms a concern: No  ?Gang Involvement:No  ? ?Subjective: Patient began the session talking about the upcoming weekend and that he has an upcoming trip in two weeks to [place]. From there, he spent the session reflecting on two different patient experiences that still contribute to distress for him. He processed thoughts and emotions related to these two situations. He asked to follow up. He denied suicidal and homicidal ideation.   ? ?Interventions:  Worked on developing a therapeutic relationship with the patient. Used active listening and reflective statements. Provided emotional support using empathy and validation. Used summary  statements. Processed the upcoming events in patient's life. Provided psychoeducation about not being able to meet next session due to the patient being out of state. Explored two previous patient experiences that are contributing to distress. Challenged thoughts expressed. Used socratic questions to assist the patient gain insight. Processed thoughts and emotions. Assigned homework. Provided empathic statements. Assessed for suicidal and homicidal ideation. ? ?Homework: Patient reflect on what he can do ? ?Next Session: Review homework and SMART goal forming. Emotional support ? ?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 disorder, recurrent, moderate  ? ?Plan:  ? ?Goals ?Alleviate depressive symptoms ?Recognize, accept, and cope with depressive feelings ?Develop healthy thinking patterns ?Develop healthy interpersonal relationships ?Reduce overall frequency, intensity, and duration of anxiety ?Stabilize anxiety level wile increasing ability to function ?Enhance ability to effectively cope with full variety of stressors ?Learn and implement coping skills that result in a reduction of anxiety  ? ?Objectives target date for all objectives is: 09/26/2021 ?Verbalize an understanding of the cognitive, physiological, and behavioral components of anxiety ?Learning and implement calming skills to reduce overall anxiety ?Verbalize an understanding of the role that cognitive biases play in excessive irrational worry and persistent anxiety symptoms ?Identify, challenge, and replace based fearful talk ?Learn and implement problem solving strategies ?Identify and engage in pleasant activities ?Learning and implement personal and interpersonal skills to reduce anxiety and improve interpersonal relationships ?Learn to accept limitations in life and commit to tolerating, rather than avoiding, unpleasant emotions while accomplishing meaningful goals ?Identify major life conflicts from the past  and present that form the basis for present anxiety ?Maintain  involvement in work, family, and social activities ?Reestablish a consistent sleep-wake cycle ?Cooperate with a medical evaluation  ?Cooperate with a medication evaluation by a physician ?Verbalize an accurate understanding of depression ?Verbalize an understanding of the treatment ?Identify and replace thoughts that support depression ?Learn and implement behavioral strategies ?Verbalize an understanding and resolution of current interpersonal problems ?Learn and implement problem solving and decision making skills ?Learn and implement conflict resolution skills to resolve interpersonal problems ?Verbalize an understanding of healthy and unhealthy emotions verbalize insight into how past relationships may be influence current experiences with depression ?Use mindfulness and acceptance strategies and increase value based behavior  ?Increase hopeful statements about the future.  ?Interventions ?Engage the patient in behavioral activation ?Use instruction, modeling, and role-playing to build the client's general social, communication, and/or conflict resolution skills ?Use Acceptance and Commitment Therapy to help client accept uncomfortable realities in order to accomplish value-consistent goals ?Reinforce the client's insight into the role of his/her past emotional pain and present anxiety  ?Support the client in following through with work, family, and social activities ?Teach and implement sleep hygiene practices  ?Refer the patient to a physician for a psychotropic medication consultation ?Monito the clint's psychotropic medication compliance ?Discuss how anxiety typically involves excessive worry, various bodily expressions of tension, and avoidance of what is threatening that interact to maintain the problem  ?Teach the patient relaxation skills ?Assign the patient homework ?Discuss examples demonstrating that unrealistic worry overestimates the  probability of threats and underestimates patient's ability  ?Assist the patient in analyzing his or her worries ?Help patient understand that avoidance is reinforcing  ?Consistent with treatment model, discuss how change in cognitive, behavioral, and interpersonal can help client alleviate depression ?CBT ?Behavioral activation help the client explore the relationship, nature of the dispute,  ?Help the client develop new interpersonal skills and relationships ?Conduct Problem solving therapy ?Teach conflict resolution skills ?Use a process-experiential approach ?Conduct TLDP ?Conduct ACT ?Evaluate need for psychotropic medication ?Monitor adherence to medication  ? ?Patient agreed and reviewed the treatment plan on 10/03/2020 ? ?Conception Chancy, PsyD ? ? ? ? ? ? ? ? ? ? ? ? ? ? ? ? ? ? ? ? ? ? ? ? ? ? ? ? ? ? ? ?Conception Chancy, PsyD ?

## 2021-05-05 ENCOUNTER — Other Ambulatory Visit: Payer: Self-pay | Admitting: Physical Medicine & Rehabilitation

## 2021-05-05 ENCOUNTER — Other Ambulatory Visit (HOSPITAL_BASED_OUTPATIENT_CLINIC_OR_DEPARTMENT_OTHER): Payer: Self-pay | Admitting: Family

## 2021-05-05 DIAGNOSIS — L4050 Arthropathic psoriasis, unspecified: Secondary | ICD-10-CM

## 2021-05-05 DIAGNOSIS — G894 Chronic pain syndrome: Secondary | ICD-10-CM

## 2021-05-05 DIAGNOSIS — M5416 Radiculopathy, lumbar region: Secondary | ICD-10-CM

## 2021-05-05 DIAGNOSIS — M47816 Spondylosis without myelopathy or radiculopathy, lumbar region: Secondary | ICD-10-CM

## 2021-05-05 DIAGNOSIS — G609 Hereditary and idiopathic neuropathy, unspecified: Secondary | ICD-10-CM

## 2021-05-06 ENCOUNTER — Other Ambulatory Visit (HOSPITAL_BASED_OUTPATIENT_CLINIC_OR_DEPARTMENT_OTHER): Payer: Self-pay | Admitting: Family

## 2021-05-06 ENCOUNTER — Encounter: Payer: Self-pay | Admitting: Physical Medicine & Rehabilitation

## 2021-05-06 NOTE — Telephone Encounter (Signed)
Called [facility] to update them. ?Patient takes 40m daily (half of 894mtab + half of 4045mab). With additional 14m17mN for weight gain or swelling. Upcoming appt 05/23/21 with Dr. CroiSallyanne Kuster dose can be adjusted if needed.  ?

## 2021-05-07 MED ORDER — MORPHINE SULFATE ER 20 MG PO CP24: ORAL_CAPSULE | ORAL | 0 refills | Status: DC

## 2021-05-07 MED ORDER — MORPHINE SULFATE ER 30 MG PO CP24: ORAL_CAPSULE | ORAL | 0 refills | Status: DC

## 2021-05-07 MED ORDER — OXYCODONE HCL 10 MG PO TABS: 10.0000 mg | ORAL_TABLET | Freq: Three times a day (TID) | ORAL | 0 refills | Status: DC | PRN

## 2021-05-10 ENCOUNTER — Telehealth: Payer: Self-pay | Admitting: Hematology

## 2021-05-10 NOTE — Telephone Encounter (Signed)
.  Called pt per 2/28 inbasket , Patient was unavailable, a message with appt time and date was left with number on file.   ? ?

## 2021-05-17 ENCOUNTER — Ambulatory Visit: Payer: BC Managed Care – PPO | Admitting: Psychologist

## 2021-05-21 ENCOUNTER — Ambulatory Visit: Payer: BC Managed Care – PPO | Admitting: Psychologist

## 2021-05-23 ENCOUNTER — Encounter: Payer: Self-pay | Admitting: Cardiovascular Disease

## 2021-05-23 ENCOUNTER — Other Ambulatory Visit: Payer: Self-pay

## 2021-05-23 ENCOUNTER — Ambulatory Visit: Payer: BC Managed Care – PPO | Admitting: Cardiovascular Disease

## 2021-05-23 VITALS — BP 126/72 | HR 80 | Ht 69.0 in | Wt 215.2 lb

## 2021-05-23 DIAGNOSIS — D6859 Other primary thrombophilia: Secondary | ICD-10-CM | POA: Diagnosis not present

## 2021-05-23 DIAGNOSIS — R002 Palpitations: Secondary | ICD-10-CM

## 2021-05-23 DIAGNOSIS — G4733 Obstructive sleep apnea (adult) (pediatric): Secondary | ICD-10-CM

## 2021-05-23 DIAGNOSIS — G9519 Other vascular myelopathies: Secondary | ICD-10-CM | POA: Diagnosis not present

## 2021-05-23 DIAGNOSIS — I5032 Chronic diastolic (congestive) heart failure: Secondary | ICD-10-CM | POA: Diagnosis not present

## 2021-05-23 NOTE — Progress Notes (Signed)
?Cardiology Office Note:   ? ?Date:  05/23/2021  ? ?ID:  Jesse Bowers, DOB 12-02-1965, MRN [id number] ? ?PCP:  Concepcion Elk, MD  ?Cardiologist:  Sanda Klein, MD  ? ?Referring MD: Shawnee Knapp, MD  ? ?Chief Complaint  ?Patient presents with  ? Palpitations  ? ? ? ?History of Present Illness:   ? ?Jesse Bowers is a 56 y.o. adult with a hx of chronic diastolic heart failure, obstructive sleep apnea on CPAP, history of previous stroke on chronic anticoagulation with Xarelto (protein C deficiency) returning for routine follow-up. 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 ? ?His urological surgery was complicated by severe anemia and he has just now returned to normal hemoglobin levels, iron levels reportedly still low.  He has been diagnosed with osteopenia.  Has trouble with compliance with CPAP and has an appointment with Dr. Claiborne Billings in April. ? ?Seen several weeks ago with complaints of palpitations and anxiety and feels much better after starting a low-dose of metoprolol succinate. ? ?He has not had major problems with fluid gain, although occasionally he has to take an extra dose of furosemide to maintain his "dry weight" of around 219 pounds.  He has become much more aware of the sodium content of various foods.  Unfortunately, he has also been diagnosed with celiac disease which makes it hard for him to find low sodium variance of certain foods especially bread. ? ?As always he is very sedentary due to the sequelae of his old stroke and his back and peripheral orthopedic issues.  He is in his electrical scooter today, as usual.  He has not had problems with shortness of breath or chest pain either at rest or with activity.  He does not have recent dizziness and denies syncope.  He has not had lower extremity edema. ? ?Other than after surgery, he has not had serious bleeding issues.  He did have 1 fall where he knocked his head on the wall and had a "small  lump". ? ?He has had some flareups of his psoriasis for which she takes Cosentyx and methotrexate. ? ?His last echo was 04/27/2020. He has preserved left ventricular systolic function (XJ>15%, GLS -24%), but with some echo evidence of diastolic dysfunction (mildly decreased e' for age, E/A 0.7).  ? ?He has complex obstructive and central sleep apnea, with mild daytime hypersomnolence, using his BiPAP. On MTX and Cosentyx for psoriatic arthritis. ? ?His daughter is having a successful time in college at [employer] in [place], where she swims competitively. ? ?Past Medical History:  ?Diagnosis Date  ? Abnormal weight loss   ? Anxiety   ? Arthritis   ? Cataract   ? OU  ? Celiac disease   ? Cervical neck pain with evidence of disc disease   ? patient has a cyst   ? Chronic constipation   ? Chronic diastolic heart failure ([place])   ? Pt. denies  ? Chronic pain   ? Degenerative disc disease at L5-S1 level   ? with stenosis  ? DVT (deep venous thrombosis) ([place])   ? Right upper arm, bilateral leg  ? Eczema   ? inguinal, feet  ? Elevated liver enzymes   ? 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 Family history of adverse reaction to anesthesia   ? family has problems with anesthesia of nausea and vomiting   ? Male-to-male transgender person   ? GERD (  gastroesophageal reflux disease)   ? History of in 20's  ? Gluten enteropathy   ? H/O parotitis   ? right   ? Hard of hearing   ? History of kidney stones   ? History of retinal tear   ? Bilateral  ? History of staph infection   ? required wound vac  ? Hx-TIA (transient ischemic attack)   ? 2015  ? Kidney stones 08/2020  ? LVH (left ventricular hypertrophy) 12/15/2016  ? Mild, noted on ECHO  ? MVP (mitral valve prolapse)   ? NAFL (nonalcoholic fatty liver)   ? Neck pain   ? Neuromuscular disorder ([place])   ? bilateral neuropathy feet.  ? Nuclear sclerotic cataract of both eyes 09/08/2019  ? Pneumonia 12/17/2010  ? Polycythemia   ? Polycythemia,  secondary   ? PONV (postoperative nausea and vomiting)   ? Protein C deficiency ([place])   ? Dr. Burr Medico Follows  ? Psoriasis   ? 16 X10 cm psoriatic rash on sole of left foot ; open and occ scant bleeding;   ? psoriatic arthritis   ? PTSD (post-traumatic stress disorder)   ? Scaphoid fracture of wrist 09/23/2013  ? Seizure ([place])   ? childhood, medication until age 67 then weanned completely off  ? Sleep apnea   ? split night study last done by Dr. [name] 06/18/15 shows severe OSA, CSA, and hypersomnia, rec bipap  ? Splenomegaly   ? Stenosis of ureteropelvic junction (UPJ)   ? left  ? Stroke [facility])   ? CVA vs TIA in left cerebrum causing slight right sided weakness-Dr. [name] follows  ? 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 c spine on MRI  ? Tachycardia   ? hx of   ? Transfusion history   ? past history- none recent, after surgeries due to blood loss  ? Wears glasses   ? Wears hearing aid   ? ? ?Past Surgical History:  ?Procedure Laterality Date  ? ABDOMINAL HYSTERECTOMY Bilateral 1994  ? TAH, BSO- tranverse incision at 56 yo  ? ANKLE ARTHROSCOPY WITH RECONSTRUCTION Right 2007  ? CHOLECYSTECTOMY    ? laparoscopic  ? COLONOSCOPY    ? x3  ? EYE SURGERY    ? Left eye 03/02/2018, right 02/15/2018  ? HIP ARTHROSCOPY W/ LABRAL REPAIR Right 05/11/2013  ? acetabular labral tear 03/30/2013  ? KNEE ARTHROPLASTY Right   ? KNEE JOINT MANIPULATION Left   ? x3 under anesthesia  ? KNEE SURGERY Bilateral 1984  ? Right ACL, left PCL repair  ? LITHOTRIPSY  2005  ? LIVER BIOPSY  2013  ? normal results.  ? MASTECTOMY Bilateral   ? prior to 2009  ? MOUTH SURGERY    ? NASAL SEPTUM SURGERY N/A 09/20/2015  ? by ENT Dr. [name]  ? OVARIAN CYST SURGERY Left   ? size of grapefruit, was informed that she had shortened vagina  ? SHOULDER SURGERY Bilateral   ? Right 08/15/2016, Left 11/15/2016  ? THUMB ARTHROSCOPY Left   ? THYROIDECTOMY, PARTIAL Left 2008  ? TOTAL KNEE ARTHROPLASTY Right 08/23/2018  ? Procedure: TOTAL KNEE ARTHROPLASTY;  Surgeon: [name], MD;  Location: [facility];  Service: Orthopedics;  Laterality: Right;  36mn  ? TOTAL KNEE REVISION Left 02/06/2016  ? Procedure: LEFT TOTAL KNEE REVISION;  Surgeon: FGaynelle Arabian MD;  Location: [facility];  Service: Orthopedics;  Laterality: Left;  ? TOTAL KNEE REVISION Left 04/22/2017  ? Procedure: Left knee polyethylene revision;  Surgeon: AWynelle Link  Pilar Plate, MD;  Location: [facility];  Service: Orthopedics;  Laterality: Left;  ? UPPER GI ENDOSCOPY  2003  ? ? ?Current Medications: ?Current Meds  ?Medication Sig  ? acetic acid-hydrocortisone (VOSOL-HC) OTIC solution hydrocortisone-acetic acid 1 %-2 % ear drops  ? acyclovir ointment (ZOVIRAX) 5 % as needed.  ? baclofen (LIORESAL) 20 MG tablet Take 1 tablet (20 mg total) by mouth 5 (five) times daily. 1 tablet in morning, midday, dinner and 2 tablets at bedtime  ? Calcipotriene-Betameth Diprop 0.005-0.064 % FOAM Apply 1 application topically 2 (two) times daily as needed.   ? ciprofloxacin-dexamethasone (CIPRODEX) OTIC suspension Place 4 drops into both ears 2 (two)  times daily as needed.  ? clindamycin (CLINDAGEL) 1 % gel Apply topically 2 (two) times daily.  ? Clobetasol Propionate Emulsion 0.05 % topical foam daily.  ? clorazepate (TRANXENE) 15 MG tablet Take 15 mg by mouth at bedtime.  ? clorazepate (TRANXENE) 7.5 MG tablet Take 7.5 mg by mouth 2 (two) times daily.  ? COSENTYX SENSOREADY, 300 MG, 150 MG/ML SOAJ Inject 2 Syringes into the skin every 28 (twenty-eight) days.  ? Cyanocobalamin (VITAMIN B-12) 5000 MCG SUBL Place 5,000 mcg under the tongue daily.  ? desonide (DESOWEN) 0.05 % cream Apply topically 2 (two) times daily.  ? desvenlafaxine (PRISTIQ) 100 MG 24 hr tablet Take 100 mg by mouth daily.  ? ergocalciferol (VITAMIN D2) 1.25 MG (50000 UT) capsule Take 1 capsule by mouth once a week.  ? Eszopiclone 3 MG TABS Take 3 mg by mouth daily as needed. Take immediately before bedtime  ? Fluocinolone Acetonide 0.01 % OIL Place 3 drops into both ears 2 (two) times daily  as needed for itching.  ? folic acid (FOLVITE) 1 MG tablet Take 1 mg by mouth daily.  ? furosemide (LASIX) 20 MG tablet Take 1 tablet (20 mg total) by mouth as needed for fluid or edema.  ? furosemide (LASI

## 2021-05-23 NOTE — Telephone Encounter (Signed)
I saw his updates and added his concerns to my note from today.  Please make sure he understands that not using CPAP is a common reason for feeling tired and exhausted.  Needs to address treatment when he comes back to see Dr. Claiborne Billings. ?

## 2021-05-23 NOTE — Patient Instructions (Signed)

## 2021-05-24 ENCOUNTER — Ambulatory Visit (INDEPENDENT_AMBULATORY_CARE_PROVIDER_SITE_OTHER): Payer: BC Managed Care – PPO | Admitting: Psychologist

## 2021-05-24 DIAGNOSIS — F331 Major depressive disorder, recurrent, moderate: Secondary | ICD-10-CM

## 2021-05-24 DIAGNOSIS — F411 Generalized anxiety disorder: Secondary | ICD-10-CM

## 2021-05-24 NOTE — Telephone Encounter (Signed)
Can you please adjust his Rx provider as he requested? ?

## 2021-05-24 NOTE — Progress Notes (Signed)
[facility] Counselor/Therapist Progress Note ? ?Patient ID: Jesse Bowers, MRN: [id number],   ? ?Date: 05/24/2021 ? ?Time Spent: 4:02 pm to 4:44 pm; total time: 42 minutes ? ? This session was held via video webex teletherapy due to the coronavirus risk at this time. The patient consented to video teletherapy and was located at his home during this session. He is aware it is the responsibility of the patient to secure confidentiality on his end of the session. The provider was in a private home office for the duration of this session. Limits of confidentiality were discussed with the patient.  ? ?Treatment Type: Individual Therapy ? ?Reported Symptoms: Depressive symptoms.  ? ?Mental Status Exam: ?Appearance:  Casual     ?Behavior: Appropriate  ?Motor: Normal  ?Speech/Language:  Normal Rate  ?Affect: Appropriate  ?Mood: normal  ?Thought process: normal  ?Thought content:   WNL  ?Sensory/Perceptual disturbances:   WNL  ?Orientation: oriented to person, place, and time/date  ?Attention: Good  ?Concentration: Good  ?Memory: WNL  ?Fund of knowledge:  Good  ?Insight:   Fair  ?Judgment:  Fair  ?Impulse Control: Good  ? ?Risk Assessment: ?Danger to Self:  No ?Self-injurious Behavior: No ?Danger to Others: No ?Duty to Warn:no ?Physical Aggression / Violence:No  ?Access to Firearms a concern: No  ?Gang Involvement:No  ? ?Subjective: Patient began the session describing himself as doing okay. After reflecting on events since the last session, he spent the session reflecting on ways to cope when his father is present over the next week. He asked to follow up. He denied suicidal and homicidal ideation.   ? ?Interventions:  Worked on developing a therapeutic relationship with the patient. Used active listening and reflective statements. Provided emotional support using empathy and validation. Praised patient for doing better and explored what has assisted the patient. Validated expressed thoughts and emotions.  Explored different coping strategies for when father is present. Assisted in problem solving. Explored the idea of having tools available to complete tasks at home that patient enjoys.  Provided empathic statements. Assessed for suicidal and homicidal ideation. ? ?Homework: NA ? ?Next Session: Emotional support ? ?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 disorder, recurrent, moderate  ? ?Plan:  ? ?Goals ?Alleviate depressive symptoms ?Recognize, accept, and cope with depressive feelings ?Develop healthy thinking patterns ?Develop healthy interpersonal relationships ?Reduce overall frequency, intensity, and duration of anxiety ?Stabilize anxiety level wile increasing ability to function ?Enhance ability to effectively cope with full variety of stressors ?Learn and implement coping skills that result in a reduction of anxiety  ? ?Objectives target date for all objectives is: 09/26/2021 ?Verbalize an understanding of the cognitive, physiological, and behavioral components of anxiety ?Learning and implement calming skills to reduce overall anxiety ?Verbalize an understanding of the role that cognitive biases play in excessive irrational worry and persistent anxiety symptoms ?Identify, challenge, and replace based fearful talk ?Learn and implement problem solving strategies ?Identify and engage in pleasant activities ?Learning and implement personal and interpersonal skills to reduce anxiety and improve interpersonal relationships ?Learn to accept limitations in life and commit to tolerating, rather than avoiding, unpleasant emotions while accomplishing meaningful goals ?Identify major life conflicts from the past and present that form the basis for present anxiety ?Maintain involvement in work, family, and social activities ?Reestablish a consistent sleep-wake cycle ?Cooperate with a medical evaluation  ?Cooperate with a medication evaluation by a physician ?Verbalize an accurate  understanding of depression ?Verbalize an understanding of the  treatment ?Identify and replace thoughts that support depression ?Learn and implement behavioral strategies ?Verbalize an understanding and resolution of current interpersonal problems ?Learn and implement problem solving and decision making skills ?Learn and implement conflict resolution skills to resolve interpersonal problems ?Verbalize an understanding of healthy and unhealthy emotions verbalize insight into how past relationships may be influence current experiences with depression ?Use mindfulness and acceptance strategies and increase value based behavior  ?Increase hopeful statements about the future.  ?Interventions ?Engage the patient in behavioral activation ?Use instruction, modeling, and role-playing to build the client's general social, communication, and/or conflict resolution skills ?Use Acceptance and Commitment Therapy to help client accept uncomfortable realities in order to accomplish value-consistent goals ?Reinforce the client's insight into the role of his/her past emotional pain and present anxiety  ?Support the client in following through with work, family, and social activities ?Teach and implement sleep hygiene practices  ?Refer the patient to a physician for a psychotropic medication consultation ?Monito the clint's psychotropic medication compliance ?Discuss how anxiety typically involves excessive worry, various bodily expressions of tension, and avoidance of what is threatening that interact to maintain the problem  ?Teach the patient relaxation skills ?Assign the patient homework ?Discuss examples demonstrating that unrealistic worry overestimates the probability of threats and underestimates patient's ability  ?Assist the patient in analyzing his or her worries ?Help patient understand that avoidance is reinforcing  ?Consistent with treatment model, discuss how change in cognitive, behavioral, and interpersonal can help  client alleviate depression ?CBT ?Behavioral activation help the client explore the relationship, nature of the dispute,  ?Help the client develop new interpersonal skills and relationships ?Conduct Problem solving therapy ?Teach conflict resolution skills ?Use a process-experiential approach ?Conduct TLDP ?Conduct ACT ?Evaluate need for psychotropic medication ?Monitor adherence to medication  ? ?Patient agreed and reviewed the treatment plan on 10/03/2020 ? ?Conception Chancy, PsyD ? ?

## 2021-05-28 ENCOUNTER — Encounter: Payer: Self-pay | Admitting: Cardiovascular Disease

## 2021-05-28 MED ORDER — FUROSEMIDE 80 MG PO TABS: 40.0000 mg | ORAL_TABLET | Freq: Every day | ORAL | 1 refills | Status: DC

## 2021-05-28 MED ORDER — FUROSEMIDE 40 MG PO TABS: 20.0000 mg | ORAL_TABLET | Freq: Every day | ORAL | 1 refills | Status: DC

## 2021-05-28 MED ORDER — FUROSEMIDE 20 MG PO TABS: 20.0000 mg | ORAL_TABLET | ORAL | 2 refills | Status: DC | PRN

## 2021-05-28 NOTE — Addendum Note (Signed)
Addended by: Ricci Barker on: 05/28/2021 04:23 PM ? ? Modules accepted: Orders ? ?

## 2021-05-31 ENCOUNTER — Ambulatory Visit (INDEPENDENT_AMBULATORY_CARE_PROVIDER_SITE_OTHER): Payer: BC Managed Care – PPO | Admitting: Psychologist

## 2021-05-31 DIAGNOSIS — F331 Major depressive disorder, recurrent, moderate: Secondary | ICD-10-CM | POA: Diagnosis not present

## 2021-05-31 DIAGNOSIS — F411 Generalized anxiety disorder: Secondary | ICD-10-CM

## 2021-05-31 NOTE — Progress Notes (Signed)
[place] Counselor/Therapist Progress Note ? ?Patient ID: Jesse Bowers, MRN: [id number],   ? ?Date: 05/31/2021 ? ?Time Spent: 1:05 pm to 1:47 pm; total time: 42 minutes ? ? This session was held via video webex teletherapy due to the coronavirus risk at this time. The patient consented to video teletherapy and was located at his home during this session. He is aware it is the responsibility of the patient to secure confidentiality on his end of the session. The provider was in a private home office for the duration of this session. Limits of confidentiality were discussed with the patient.  ? ?Treatment Type: Individual Therapy ? ?Reported Symptoms: Distress with spouse ? ?Mental Status Exam: ?Appearance:  Casual     ?Behavior: Appropriate  ?Motor: Normal  ?Speech/Language:  Normal Rate  ?Affect: Appropriate  ?Mood: normal  ?Thought process: normal  ?Thought content:   WNL  ?Sensory/Perceptual disturbances:   WNL  ?Orientation: oriented to person, place, and time/date  ?Attention: Good  ?Concentration: Good  ?Memory: WNL  ?Fund of knowledge:  Good  ?Insight:   Fair  ?Judgment:  Fair  ?Impulse Control: Good  ? ?Risk Assessment: ?Danger to Self:  No ?Self-injurious Behavior: No ?Danger to Others: No ?Duty to Warn:no ?Physical Aggression / Violence:No  ?Access to Firearms a concern: No  ?Gang Involvement:No  ? ?Subjective: Patient began the session describing himself as doing fair while reflecting on his father being in town. From there, he spent the session reflecting on distress that he is experiencing related to due to lack of connection with his wife [name]. He processed thoughts and emotions about the situation. He reflected on different options he has related to the marriage and indicated that he wants to have a serious conversation with her regarding his concerns on Sunday. He asked to follow up. He denied suicidal and homicidal ideation.   ? ?Interventions:  Worked on developing a therapeutic  relationship with the patient. Used active listening and reflective statements. Provided emotional support using empathy and validation. Used summary statements. Reflected on the events since the last session. Validated expressed thoughts and emotions. Processed expressed thoughts and emotions. Attempted to assist with problem solving. Challenged some of the thoughts expressed. Assisted the patient in doing a decisional analysis related to his wife. Used socratic questions to assist the patient gain insight into self. Briefly explored different options that patient had available to him.  Provided empathic statements. Assessed for suicidal and homicidal ideation. ? ?Homework: Have a serious conversation with his wife regarding concerns.  ? ?Next Session: Emotional support ? ?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 disorder, recurrent, moderate  ? ?Plan:  ? ?Goals ?Alleviate depressive symptoms ?Recognize, accept, and cope with depressive feelings ?Develop healthy thinking patterns ?Develop healthy interpersonal relationships ?Reduce overall frequency, intensity, and duration of anxiety ?Stabilize anxiety level wile increasing ability to function ?Enhance ability to effectively cope with full variety of stressors ?Learn and implement coping skills that result in a reduction of anxiety  ? ?Objectives target date for all objectives is: 09/26/2021 ?Verbalize an understanding of the cognitive, physiological, and behavioral components of anxiety ?Learning and implement calming skills to reduce overall anxiety ?Verbalize an understanding of the role that cognitive biases play in excessive irrational worry and persistent anxiety symptoms ?Identify, challenge, and replace based fearful talk ?Learn and implement problem solving strategies ?Identify and engage in pleasant activities ?Learning and implement personal and interpersonal skills to reduce anxiety and improve interpersonal  relationships ?Learn  to accept limitations in life and commit to tolerating, rather than avoiding, unpleasant emotions while accomplishing meaningful goals ?Identify major life conflicts from the past and present that form the basis for present anxiety ?Maintain involvement in work, family, and social activities ?Reestablish a consistent sleep-wake cycle ?Cooperate with a medical evaluation  ?Cooperate with a medication evaluation by a physician ?Verbalize an accurate understanding of depression ?Verbalize an understanding of the treatment ?Identify and replace thoughts that support depression ?Learn and implement behavioral strategies ?Verbalize an understanding and resolution of current interpersonal problems ?Learn and implement problem solving and decision making skills ?Learn and implement conflict resolution skills to resolve interpersonal problems ?Verbalize an understanding of healthy and unhealthy emotions verbalize insight into how past relationships may be influence current experiences with depression ?Use mindfulness and acceptance strategies and increase value based behavior  ?Increase hopeful statements about the future.  ?Interventions ?Engage the patient in behavioral activation ?Use instruction, modeling, and role-playing to build the client's general social, communication, and/or conflict resolution skills ?Use Acceptance and Commitment Therapy to help client accept uncomfortable realities in order to accomplish value-consistent goals ?Reinforce the client's insight into the role of his/her past emotional pain and present anxiety  ?Support the client in following through with work, family, and social activities ?Teach and implement sleep hygiene practices  ?Refer the patient to a physician for a psychotropic medication consultation ?Monito the clint's psychotropic medication compliance ?Discuss how anxiety typically involves excessive worry, various bodily expressions of tension, and avoidance of  what is threatening that interact to maintain the problem  ?Teach the patient relaxation skills ?Assign the patient homework ?Discuss examples demonstrating that unrealistic worry overestimates the probability of threats and underestimates patient's ability  ?Assist the patient in analyzing his or her worries ?Help patient understand that avoidance is reinforcing  ?Consistent with treatment model, discuss how change in cognitive, behavioral, and interpersonal can help client alleviate depression ?CBT ?Behavioral activation help the client explore the relationship, nature of the dispute,  ?Help the client develop new interpersonal skills and relationships ?Conduct Problem solving therapy ?Teach conflict resolution skills ?Use a process-experiential approach ?Conduct TLDP ?Conduct ACT ?Evaluate need for psychotropic medication ?Monitor adherence to medication  ? ?Patient agreed and reviewed the treatment plan on 10/03/2020 ? ?Conception Chancy, PsyD ? ?

## 2021-06-02 ENCOUNTER — Encounter: Payer: Self-pay | Admitting: Physical Medicine & Rehabilitation

## 2021-06-04 ENCOUNTER — Encounter: Payer: Self-pay | Admitting: Physical Medicine & Rehabilitation

## 2021-06-04 ENCOUNTER — Encounter: Payer: Self-pay | Admitting: Cardiovascular Disease

## 2021-06-04 ENCOUNTER — Encounter: Payer: Self-pay | Admitting: Registered Nurse

## 2021-06-04 ENCOUNTER — Encounter: Payer: BC Managed Care – PPO | Attending: Physical Medicine & Rehabilitation | Admitting: Registered Nurse

## 2021-06-04 VITALS — BP 129/94 | HR 110 | Ht 69.0 in

## 2021-06-04 DIAGNOSIS — G609 Hereditary and idiopathic neuropathy, unspecified: Secondary | ICD-10-CM

## 2021-06-04 DIAGNOSIS — L4050 Arthropathic psoriasis, unspecified: Secondary | ICD-10-CM | POA: Diagnosis present

## 2021-06-04 DIAGNOSIS — M47816 Spondylosis without myelopathy or radiculopathy, lumbar region: Secondary | ICD-10-CM

## 2021-06-04 DIAGNOSIS — G894 Chronic pain syndrome: Secondary | ICD-10-CM

## 2021-06-04 DIAGNOSIS — M778 Other enthesopathies, not elsewhere classified: Secondary | ICD-10-CM | POA: Diagnosis present

## 2021-06-04 DIAGNOSIS — G8929 Other chronic pain: Secondary | ICD-10-CM | POA: Diagnosis present

## 2021-06-04 DIAGNOSIS — M25512 Pain in left shoulder: Secondary | ICD-10-CM | POA: Insufficient documentation

## 2021-06-04 DIAGNOSIS — M2550 Pain in unspecified joint: Secondary | ICD-10-CM

## 2021-06-04 DIAGNOSIS — M5416 Radiculopathy, lumbar region: Secondary | ICD-10-CM | POA: Diagnosis present

## 2021-06-04 DIAGNOSIS — M542 Cervicalgia: Secondary | ICD-10-CM | POA: Diagnosis present

## 2021-06-04 MED ORDER — MORPHINE SULFATE ER 20 MG PO CP24: ORAL_CAPSULE | ORAL | 0 refills | Status: DC

## 2021-06-04 MED ORDER — MORPHINE SULFATE ER 30 MG PO CP24: ORAL_CAPSULE | ORAL | 0 refills | Status: DC

## 2021-06-04 NOTE — Progress Notes (Signed)
? ?Subjective:  ? ? Patient ID: Jesse Bowers, adult    DOB: 1965-10-07, 56 y.o.   MRN: [id number] ? ?HPI: Jesse Bowers is a 56 y.o. male who returns for follow up appointment for chronic pain and medication refill. He states his pain is located in his neck radiating into his left shoulder, bilateral shoulders and mid- lower back pain radiating into his bilateral lower extremities. He also reports  right wrist pain and generalized joint pain. He rates his pain 7. His current exercise regime is walking short distances. ? ?Jesse Bowers also states he had a fall in February, he was walking and tripped over carpet, he landed on his left side. He seen his orthopedist regrding the fall and had a left shoulder cortisone injection. Educated on [employer]. He verbalizes understanding.  ? ?He reports he has had diarrhea for the last 4 days, he has followed up with his PCP and GI ? ?Jesse Bowers Morphine equivalent is 145.54 MME.   He is also prescribed Clorazepate  by Dr. Erling Cruz .We have discussed the black box warning of using opioids and benzodiazepines. I highlighted the dangers of using these drugs together and discussed the adverse events including respiratory suppression, overdose, cognitive impairment and importance of compliance with current regimen. We will continue to monitor and adjust as indicated.  ?he is being closely monitored and under the care of his psychiatrist.  ? ?Last UDS was Performed on 04/03/2021, it was consistent.  ?  ? ?Pain Inventory ?Average Pain 8 ?Pain Right Now 7 ?My pain is intermittent, constant, sharp, stabbing, tingling, and aching ? ?LOCATION OF PAIN  head, neck, left shoulder, right wrist, bilateral hands, fingers, back, right hip, knee, leg, all toes ? ?BOWEL ?Number of stools per week: 15 ?Oral laxative use Yes  ?Type of laxative Linzess 290 mcg ?Enema or suppository use No  ?History of colostomy No  ?Incontinent Yes  ? ?BLADDER ?Normal ?Bladder incontinence No  ?Frequent urination Yes   ?Leakage with coughing No  ?Difficulty starting stream No  ?Incomplete bladder emptying No  ? ? ?Mobility ?ability to climb steps?  yes ?do you drive?  yes ?use a wheelchair ?transfers alone ? ?Function ?disabled: date disabled 06/14/12 ?I need assistance with the following:  bathing, meal prep, household duties, and shopping ?Do you have any goals in this area?  yes ? ?Neuro/Psych ?weakness ?numbness ?tremor ?tingling ?trouble walking ?spasms ?dizziness ?depression ?anxiety ?loss of taste or smell ? ?Prior Studies ?Any changes since last visit?  no ? ?Physicians involved in your care ?Any changes since last visit?  no ? ? ?Family History  ?Problem Relation Age of Onset  ? Stroke Maternal Grandfather   ?     13  ? Heart attack Maternal Grandfather   ? Glaucoma Maternal Grandfather   ? Macular degeneration Maternal Grandfather   ? Breast cancer Sister   ? Hypertension Mother   ? Psoriasis Mother   ? Other Mother   ?     meningioma developed ~2019  ? Glaucoma Mother   ? Cancer Paternal Grandfather   ? Heart attack Paternal Grandfather   ? Stroke Paternal Uncle   ?     age 72  ? Polycythemia Paternal Uncle   ? Stroke Maternal Grandmother   ? Congestive Heart Failure Maternal Grandmother   ? Heart attack Maternal Grandmother   ? Protein C deficiency Sister 52  ?     Miscarriages  ? Breast cancer Maternal Aunt 35  ? ?Social  History  ? ?Socioeconomic History  ? Marital status: Married  ?  Spouse name: Not on file  ? Number of children: 2  ? Years of education: 4y college  ? Highest education level: Not on file  ?Occupational History  ? Occupation: Pediatric Nurse practitioner  ?  Comment: Not working since CVA 2015  ?Tobacco Use  ? Smoking status: Never  ? Smokeless tobacco: Never  ?Vaping Use  ? Vaping Use: Never used  ?Substance and Sexual Activity  ? Alcohol use: Yes  ?  Comment: social  ? Drug use: No  ? Sexual activity: Yes  ?  Birth control/protection: None  ?  Comment: patient is a transgender on testosterone  shots, no biological kids  ?Other Topics Concern  ? Not on file  ?Social History Narrative  ? Education 4 year college, former Therapist, sports X 15 years, pediatric nurse practitioner x 6 years, did NP degree from [employer] [place]. Relocated to [place] about 2 months ago from [place], [place]. Patient was in [place] for last 4 years and prior to that in [place]. His wife is working as Scientist, research (physical sciences) for [employer]. Patient is not working and applying for disability. They have 2 kids but no biologic children.   ? ?Social Determinants of Health  ? ?Financial Resource Strain: Not on file  ?Food Insecurity: Not on file  ?Transportation Needs: Not on file  ?Physical Activity: Not on file  ?Stress: Not on file  ?Social Connections: Not on file  ? ?Past Surgical History:  ?Procedure Laterality Date  ? ABDOMINAL HYSTERECTOMY Bilateral 1994  ? TAH, BSO- tranverse incision at 56 yo  ? ANKLE ARTHROSCOPY WITH RECONSTRUCTION Right 2007  ? CHOLECYSTECTOMY    ? laparoscopic  ? COLONOSCOPY    ? x3  ? EYE SURGERY    ? Left eye 03/02/2018, right 02/15/2018  ? HIP ARTHROSCOPY W/ LABRAL REPAIR Right 05/11/2013  ? acetabular labral tear 03/30/2013  ? KNEE ARTHROPLASTY Right   ? KNEE JOINT MANIPULATION Left   ? x3 under anesthesia  ? KNEE SURGERY Bilateral 1984  ? Right ACL, left PCL repair  ? LITHOTRIPSY  2005  ? LIVER BIOPSY  2013  ? normal results.  ? MASTECTOMY Bilateral   ? prior to 2009  ? MOUTH SURGERY    ? NASAL SEPTUM SURGERY N/A 09/20/2015  ? by ENT Dr. [name]  ? OVARIAN CYST SURGERY Left   ? size of grapefruit, was informed that she had shortened vagina  ? SHOULDER SURGERY Bilateral   ? Right 08/15/2016, Left 11/15/2016  ? THUMB ARTHROSCOPY Left   ? THYROIDECTOMY, PARTIAL Left 2008  ? TOTAL KNEE ARTHROPLASTY Right 08/23/2018  ? Procedure: TOTAL KNEE ARTHROPLASTY;  Surgeon: [name], MD;  Location: [facility];  Service: Orthopedics;  Laterality: Right;  32mn  ? TOTAL KNEE REVISION Left 02/06/2016  ? Procedure: LEFT TOTAL KNEE REVISION;   Surgeon: FGaynelle Arabian MD;  Location: [facility];  Service: Orthopedics;  Laterality: Left;  ? TOTAL KNEE REVISION Left 04/22/2017  ? Procedure: Left knee polyethylene revision;  Surgeon: AGaynelle Arabian MD;  Location: [facility];  Service: Orthopedics;  Laterality: Left;  ? UPPER GI ENDOSCOPY  2003  ? ?Past Medical History:  ?Diagnosis Date  ? Abnormal weight loss   ? Anxiety   ? Arthritis   ? Cataract   ? OU  ? Celiac disease   ? Cervical neck pain with evidence of disc disease   ? patient has a cyst   ?  Chronic constipation   ? Chronic diastolic heart failure ([place])   ? Pt. denies  ? Chronic pain   ? Degenerative disc disease at L5-S1 level   ? with stenosis  ? DVT (deep venous thrombosis) ([place])   ? Right upper arm, bilateral leg  ? Eczema   ? inguinal, feet  ? Elevated liver enzymes   ? 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 Family history of adverse reaction to anesthesia   ? family has problems with anesthesia of nausea and vomiting   ? Male-to-male transgender person   ? GERD (gastroesophageal reflux disease)   ? History of in 20's  ? Gluten enteropathy   ? H/O parotitis   ? right   ? Hard of hearing   ? History of kidney stones   ? History of retinal tear   ? Bilateral  ? History of staph infection   ? required wound vac  ? Hx-TIA (transient ischemic attack)   ? 2015  ? Kidney stones 08/2020  ? LVH (left ventricular hypertrophy) 12/15/2016  ? Mild, noted on ECHO  ? MVP (mitral valve prolapse)   ? NAFL (nonalcoholic fatty liver)   ? Neck pain   ? Neuromuscular disorder ([place])   ? bilateral neuropathy feet.  ? Nuclear sclerotic cataract of both eyes 09/08/2019  ? Pneumonia 12/17/2010  ? Polycythemia   ? Polycythemia, secondary   ? PONV (postoperative nausea and vomiting)   ? Protein C deficiency ([place])   ? Dr. Burr Medico Follows  ? Psoriasis   ? 16 X10 cm psoriatic rash on sole of left foot ; open and occ scant bleeding;   ? psoriatic arthritis   ? PTSD (post-traumatic stress disorder)   ? Scaphoid fracture of  wrist 09/23/2013  ? Seizure ([place])   ? childhood, medication until age 33 then weanned completely off  ? Sleep apnea   ? split night study last done by Dr. [name] 06/18/15 shows severe OSA, CSA, and hypersomnia,

## 2021-06-07 ENCOUNTER — Ambulatory Visit (INDEPENDENT_AMBULATORY_CARE_PROVIDER_SITE_OTHER): Payer: BC Managed Care – PPO | Admitting: Psychologist

## 2021-06-07 DIAGNOSIS — F331 Major depressive disorder, recurrent, moderate: Secondary | ICD-10-CM | POA: Diagnosis not present

## 2021-06-07 DIAGNOSIS — F411 Generalized anxiety disorder: Secondary | ICD-10-CM | POA: Diagnosis not present

## 2021-06-07 NOTE — Progress Notes (Signed)
[facility] Counselor/Therapist Progress Note ? ?Patient ID: Jesse Bowers, MRN: [id number],   ? ?Date: 06/07/2021 ? ?Time Spent: 1:05 pm to 1:43 pm; total time: 38 minutes ? ? This session was held via video webex teletherapy due to the coronavirus risk at this time. The patient consented to video teletherapy and was located at his home during this session. He is aware it is the responsibility of the patient to secure confidentiality on his end of the session. The provider was in a private home office for the duration of this session. Limits of confidentiality were discussed with the patient.  ? ?Treatment Type: Individual Therapy ? ?Reported Symptoms: Improved relationship with spouse ? ?Mental Status Exam: ?Appearance:  Casual     ?Behavior: Appropriate  ?Motor: Normal  ?Speech/Language:  Normal Rate  ?Affect: Appropriate  ?Mood: normal  ?Thought process: normal  ?Thought content:   WNL  ?Sensory/Perceptual disturbances:   WNL  ?Orientation: oriented to person, place, and time/date  ?Attention: Good  ?Concentration: Good  ?Memory: WNL  ?Fund of knowledge:  Good  ?Insight:   Fair  ?Judgment:  Fair  ?Impulse Control: Good  ? ?Risk Assessment: ?Danger to Self:  No ?Self-injurious Behavior: No ?Danger to Others: No ?Duty to Warn:no ?Physical Aggression / Violence:No  ?Access to Firearms a concern: No  ?Gang Involvement:No  ? ?Subjective: Patient began the session describing himself as doing well and indicated that he and Tess have been able to connect in the past week. Elaborating, he voiced that she has been planning a trip to [facility] with him. He spent time reflecting on ways that he could express his appreciation to her. He asked to follow up. He denied suicidal and homicidal ideation.   ? ?Interventions:  Worked on developing a therapeutic relationship with the patient. Used active listening and reflective statements. Provided emotional support using empathy and validation. Reflected on events since  the last session. Praised patient for doing better and explored what has assisted the patient. Normalized the expressed emotions. Used socratic questions to assist the patient gain insight into self. Challenged some of the thoughts expressed by the patient. Explored ways that patient could express gratitude towards his wife. Assisted in problem solving. Processed expressed thoughts and emotions.  Provided empathic statements. Assessed for suicidal and homicidal ideation. ? ?Homework: NA ? ?Next Session: Emotional support. Discuss how to get emotional needs met and continuing to strengthen relationship with wife.  ? ?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 disorder, recurrent, moderate  ? ?Plan:  ? ?Goals ?Alleviate depressive symptoms ?Recognize, accept, and cope with depressive feelings ?Develop healthy thinking patterns ?Develop healthy interpersonal relationships ?Reduce overall frequency, intensity, and duration of anxiety ?Stabilize anxiety level wile increasing ability to function ?Enhance ability to effectively cope with full variety of stressors ?Learn and implement coping skills that result in a reduction of anxiety  ? ?Objectives target date for all objectives is: 09/26/2021 ?Verbalize an understanding of the cognitive, physiological, and behavioral components of anxiety ?Learning and implement calming skills to reduce overall anxiety ?Verbalize an understanding of the role that cognitive biases play in excessive irrational worry and persistent anxiety symptoms ?Identify, challenge, and replace based fearful talk ?Learn and implement problem solving strategies ?Identify and engage in pleasant activities ?Learning and implement personal and interpersonal skills to reduce anxiety and improve interpersonal relationships ?Learn to accept limitations in life and commit to tolerating, rather than avoiding, unpleasant emotions while accomplishing meaningful  goals ?Identify major life  conflicts from the past and present that form the basis for present anxiety ?Maintain involvement in work, family, and social activities ?Reestablish a consistent sleep-wake cycle ?Cooperate with a medical evaluation  ?Cooperate with a medication evaluation by a physician ?Verbalize an accurate understanding of depression ?Verbalize an understanding of the treatment ?Identify and replace thoughts that support depression ?Learn and implement behavioral strategies ?Verbalize an understanding and resolution of current interpersonal problems ?Learn and implement problem solving and decision making skills ?Learn and implement conflict resolution skills to resolve interpersonal problems ?Verbalize an understanding of healthy and unhealthy emotions verbalize insight into how past relationships may be influence current experiences with depression ?Use mindfulness and acceptance strategies and increase value based behavior  ?Increase hopeful statements about the future.  ?Interventions ?Engage the patient in behavioral activation ?Use instruction, modeling, and role-playing to build the client's general social, communication, and/or conflict resolution skills ?Use Acceptance and Commitment Therapy to help client accept uncomfortable realities in order to accomplish value-consistent goals ?Reinforce the client's insight into the role of his/her past emotional pain and present anxiety  ?Support the client in following through with work, family, and social activities ?Teach and implement sleep hygiene practices  ?Refer the patient to a physician for a psychotropic medication consultation ?Monito the clint's psychotropic medication compliance ?Discuss how anxiety typically involves excessive worry, various bodily expressions of tension, and avoidance of what is threatening that interact to maintain the problem  ?Teach the patient relaxation skills ?Assign the patient homework ?Discuss examples  demonstrating that unrealistic worry overestimates the probability of threats and underestimates patient's ability  ?Assist the patient in analyzing his or her worries ?Help patient understand that avoidance is reinforcing  ?Consistent with treatment model, discuss how change in cognitive, behavioral, and interpersonal can help client alleviate depression ?CBT ?Behavioral activation help the client explore the relationship, nature of the dispute,  ?Help the client develop new interpersonal skills and relationships ?Conduct Problem solving therapy ?Teach conflict resolution skills ?Use a process-experiential approach ?Conduct TLDP ?Conduct ACT ?Evaluate need for psychotropic medication ?Monitor adherence to medication  ? ?Patient agreed and reviewed the treatment plan on 10/03/2020 ? ?Conception Chancy, PsyD ? ?

## 2021-06-11 ENCOUNTER — Encounter: Payer: Self-pay | Admitting: Cardiovascular Disease

## 2021-06-19 ENCOUNTER — Ambulatory Visit (INDEPENDENT_AMBULATORY_CARE_PROVIDER_SITE_OTHER): Payer: BC Managed Care – PPO | Admitting: Psychologist

## 2021-06-19 DIAGNOSIS — F331 Major depressive disorder, recurrent, moderate: Secondary | ICD-10-CM

## 2021-06-19 DIAGNOSIS — F411 Generalized anxiety disorder: Secondary | ICD-10-CM

## 2021-06-19 NOTE — Progress Notes (Signed)
[facility] Counselor/Therapist Progress Note ? ?Patient ID: Jesse Bowers, MRN: [id number],   ? ?Date: 06/19/2021 ? ?Time Spent: 1:05 pm to 1:43 pm; total time: 38 minutes ? ? This session was held via video webex teletherapy due to the coronavirus risk at this time. The patient consented to video teletherapy and was located at his home during this session. He is aware it is the responsibility of the patient to secure confidentiality on his end of the session. The provider was in a private home office for the duration of this session. Limits of confidentiality were discussed with the patient.  ? ?Treatment Type: Individual Therapy ? ?Reported Symptoms: Some distress with spouse ? ?Mental Status Exam: ?Appearance:  Casual     ?Behavior: Appropriate  ?Motor: Normal  ?Speech/Language:  Normal Rate  ?Affect: Appropriate  ?Mood: normal  ?Thought process: normal  ?Thought content:   WNL  ?Sensory/Perceptual disturbances:   WNL  ?Orientation: oriented to person, place, and time/date  ?Attention: Good  ?Concentration: Good  ?Memory: WNL  ?Fund of knowledge:  Good  ?Insight:   Fair  ?Judgment:  Fair  ?Impulse Control: Good  ? ?Risk Assessment: ?Danger to Self:  No ?Self-injurious Behavior: No ?Danger to Others: No ?Duty to Warn:no ?Physical Aggression / Violence:No  ?Access to Firearms a concern: No  ?Gang Involvement:No  ? ?Subjective: Patient began the session disclosing that his wife has a new job starting in May, meaning that they won't be able to take their anniversary trip. Patient spent the session reflecting on obstacles towards having an open conversation with his wife about how to connect closer with her transitioning to a new job.  He asked to follow up. He denied suicidal and homicidal ideation.   ? ?Interventions:  Worked on developing a therapeutic relationship with the patient. Used active listening and reflective statements. Provided emotional support using empathy and validation. Praised  patient for doing well. Reflected on events since the last session. Worked on addressing how the job transition may facilitate an opportunity to have a conversation with his wife about connecting with each other. Used socratic questions to assist the patient. Challenged the patient. Redirected the patient. Assigned homework. Provided empathic statements. Assessed for suicidal and homicidal ideation. ? ?Homework: Talk with wife about how to connect at a deeper level ? ?Next Session: Emotional support and review homework ? ?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 disorder, recurrent, moderate  ? ?Plan:  ? ?Goals ?Alleviate depressive symptoms ?Recognize, accept, and cope with depressive feelings ?Develop healthy thinking patterns ?Develop healthy interpersonal relationships ?Reduce overall frequency, intensity, and duration of anxiety ?Stabilize anxiety level wile increasing ability to function ?Enhance ability to effectively cope with full variety of stressors ?Learn and implement coping skills that result in a reduction of anxiety  ? ?Objectives target date for all objectives is: 09/26/2021 ?Verbalize an understanding of the cognitive, physiological, and behavioral components of anxiety ?Learning and implement calming skills to reduce overall anxiety ?Verbalize an understanding of the role that cognitive biases play in excessive irrational worry and persistent anxiety symptoms ?Identify, challenge, and replace based fearful talk ?Learn and implement problem solving strategies ?Identify and engage in pleasant activities ?Learning and implement personal and interpersonal skills to reduce anxiety and improve interpersonal relationships ?Learn to accept limitations in life and commit to tolerating, rather than avoiding, unpleasant emotions while accomplishing meaningful goals ?Identify major life conflicts from the past and present that form the basis for present  anxiety ?Maintain  involvement in work, family, and social activities ?Reestablish a consistent sleep-wake cycle ?Cooperate with a medical evaluation  ?Cooperate with a medication evaluation by a physician ?Verbalize an accurate understanding of depression ?Verbalize an understanding of the treatment ?Identify and replace thoughts that support depression ?Learn and implement behavioral strategies ?Verbalize an understanding and resolution of current interpersonal problems ?Learn and implement problem solving and decision making skills ?Learn and implement conflict resolution skills to resolve interpersonal problems ?Verbalize an understanding of healthy and unhealthy emotions verbalize insight into how past relationships may be influence current experiences with depression ?Use mindfulness and acceptance strategies and increase value based behavior  ?Increase hopeful statements about the future.  ?Interventions ?Engage the patient in behavioral activation ?Use instruction, modeling, and role-playing to build the client's general social, communication, and/or conflict resolution skills ?Use Acceptance and Commitment Therapy to help client accept uncomfortable realities in order to accomplish value-consistent goals ?Reinforce the client's insight into the role of his/her past emotional pain and present anxiety  ?Support the client in following through with work, family, and social activities ?Teach and implement sleep hygiene practices  ?Refer the patient to a physician for a psychotropic medication consultation ?Monito the clint's psychotropic medication compliance ?Discuss how anxiety typically involves excessive worry, various bodily expressions of tension, and avoidance of what is threatening that interact to maintain the problem  ?Teach the patient relaxation skills ?Assign the patient homework ?Discuss examples demonstrating that unrealistic worry overestimates the probability of threats and underestimates  patient's ability  ?Assist the patient in analyzing his or her worries ?Help patient understand that avoidance is reinforcing  ?Consistent with treatment model, discuss how change in cognitive, behavioral, and interpersonal can help client alleviate depression ?CBT ?Behavioral activation help the client explore the relationship, nature of the dispute,  ?Help the client develop new interpersonal skills and relationships ?Conduct Problem solving therapy ?Teach conflict resolution skills ?Use a process-experiential approach ?Conduct TLDP ?Conduct ACT ?Evaluate need for psychotropic medication ?Monitor adherence to medication  ? ?Patient agreed and reviewed the treatment plan on 10/03/2020 ? ?Conception Chancy, PsyD ? ?

## 2021-06-20 ENCOUNTER — Encounter (INDEPENDENT_AMBULATORY_CARE_PROVIDER_SITE_OTHER): Payer: Self-pay | Admitting: Ophthalmology

## 2021-06-27 ENCOUNTER — Telehealth: Payer: Self-pay | Admitting: Registered Nurse

## 2021-06-27 ENCOUNTER — Encounter: Payer: Self-pay | Admitting: Cardiovascular Disease

## 2021-06-27 ENCOUNTER — Encounter: Payer: Self-pay | Admitting: Physical Medicine & Rehabilitation

## 2021-06-27 DIAGNOSIS — G894 Chronic pain syndrome: Secondary | ICD-10-CM

## 2021-06-27 DIAGNOSIS — M5416 Radiculopathy, lumbar region: Secondary | ICD-10-CM

## 2021-06-27 MED ORDER — BACLOFEN 20 MG PO TABS: 20.0000 mg | ORAL_TABLET | Freq: Every day | ORAL | 1 refills | Status: DC

## 2021-06-27 NOTE — Telephone Encounter (Signed)
Baclofen prescription sent to pharmacy. 90 day supply.  ?

## 2021-06-27 NOTE — Telephone Encounter (Signed)
Dr [name],  ?Lief is requesting a 90 days supply on his narcotic. His wife will be starting a new job at [employer] in May and he won't have insurance for [employer]. You can see his My-Chart for more details. [name]  ?I let him know we usually don't prescribe for 90 days, I will await your response.  ?

## 2021-07-02 ENCOUNTER — Telehealth: Payer: Self-pay | Admitting: Registered Nurse

## 2021-07-02 DIAGNOSIS — M47816 Spondylosis without myelopathy or radiculopathy, lumbar region: Secondary | ICD-10-CM

## 2021-07-02 DIAGNOSIS — G609 Hereditary and idiopathic neuropathy, unspecified: Secondary | ICD-10-CM

## 2021-07-02 MED ORDER — OXYCODONE HCL 10 MG PO TABS: 10.0000 mg | ORAL_TABLET | Freq: Three times a day (TID) | ORAL | 0 refills | Status: DC | PRN

## 2021-07-02 NOTE — Telephone Encounter (Signed)
PMP was Reviewed.  ?Oxycodone e-scribed today.  ?Hairo is aware via My-Chart Message.  ?

## 2021-07-03 ENCOUNTER — Telehealth: Payer: Self-pay | Admitting: *Deleted

## 2021-07-03 ENCOUNTER — Encounter: Payer: Self-pay | Admitting: Cardiovascular Disease

## 2021-07-03 ENCOUNTER — Ambulatory Visit (INDEPENDENT_AMBULATORY_CARE_PROVIDER_SITE_OTHER): Payer: BC Managed Care – PPO | Admitting: Cardiovascular Disease

## 2021-07-03 ENCOUNTER — Encounter: Payer: Self-pay | Admitting: Physical Medicine & Rehabilitation

## 2021-07-03 VITALS — BP 116/78 | HR 76 | Ht 69.0 in | Wt 217.0 lb

## 2021-07-03 DIAGNOSIS — R29818 Other symptoms and signs involving the nervous system: Secondary | ICD-10-CM

## 2021-07-03 DIAGNOSIS — G4731 Primary central sleep apnea: Secondary | ICD-10-CM

## 2021-07-03 DIAGNOSIS — I5032 Chronic diastolic (congestive) heart failure: Secondary | ICD-10-CM

## 2021-07-03 DIAGNOSIS — D6859 Other primary thrombophilia: Secondary | ICD-10-CM | POA: Diagnosis not present

## 2021-07-03 DIAGNOSIS — R002 Palpitations: Secondary | ICD-10-CM

## 2021-07-03 MED ORDER — FUROSEMIDE 40 MG PO TABS: ORAL_TABLET | ORAL | 3 refills | Status: DC

## 2021-07-03 MED ORDER — FUROSEMIDE 80 MG PO TABS: ORAL_TABLET | ORAL | 3 refills | Status: DC

## 2021-07-03 NOTE — Telephone Encounter (Signed)
MyChart message sent to the patient requesting that he brings his BIPAP machine to his visit with Dr Claiborne Billings tomorrow. ?

## 2021-07-03 NOTE — Progress Notes (Signed)
?Cardiology Office Note:   ? ?Date:  07/06/2021  ? ?ID:  Jesse Bowers, DOB 1965-03-21, MRN [id number] ? ?PCP:  Concepcion Elk, MD  ?Cardiologist:  Sanda Klein, MD  ? ?Referring MD: Concepcion Elk, MD  ? ?Chief Complaint  ?Patient presents with  ? Congestive Heart Failure  ?   ?  ? ? ? ?History of Present Illness:   ? ?Jesse Bowers is a 56 y.o. adult with a hx of chronic diastolic heart failure, obstructive sleep apnea on CPAP, history of previous stroke on chronic anticoagulation with Xarelto (protein C deficiency) returning for routine follow-up. He underwent gender affirmation surgery in December 2022, on chronic androgen therapy (transition began around the age of 26). ? ?He has substantially improved compliance with CPAP and this has improved his sleep quality and overall energy level.  He actually has decent oxygen saturation levels overnight, but is concerned that he sometimes has low oxygen levels during the daytime.  He does not think this is due to oversedation from his medications.  He does have some difficulties with CPAP at times and thinks he needs the settings adjusted.  He has an appointment with Dr. Claiborne Billings tomorrow. ? ?He does not have problems with lower extremity edema on the current diuretic regimen which on the average is torsemide 80 mg 5 days a week and 60 mg 2 days a week (he takes an extra 20 mg on most days, based on his weight).  On his home scale his "dry weight" seems to be about 210 pounds in the maximum weight in the last several weeks has been 216 pounds.  Most recent labs show creatinine of 1.1 and potassium of 3.8. ? ?Having some difficulty finding ways to stay on a gluten-free diet that is also low in sodium. ? ?After having problems with anemia following his surgery last December his hemoglobin has returned to normal at 12.2.  He also has good metabolic parameters.  Labs on 05/01/2021 showed total cholesterol 154, HDL 54, LDL 86, triglycerides 70.  Most recent hemoglobin A1c was  5.4%. ? ?He has occasional issues with palpitations, especially during periods of anxiety and the metoprolol succinate has really helped with that.  His arrhythmia monitor last May showed sinus tachycardia in association with his periods of palpitations.  He does not have orthopnea or PND.  He has not had chest pain.  He has not had syncope.  He has chronic leg pain and back pain. ? ?He remains quite sedentary due to the sequelae of the stroke and his orthopedic and spinal problems.  Continues on methotrexate for psoriasis, with good results.  He is in his [employer], academic today.   ? ?His last echo was 04/27/2020. He has preserved left ventricular systolic function (YJ>85%, GLS -24%), but with some echo evidence of diastolic dysfunction (mildly decreased e' for age, E/A 0.7).  ? ?He has complex obstructive and central sleep apnea, with mild daytime hypersomnolence, using his BiPAP. On MTX and Cosentyx for psoriatic arthritis. ? ?His daughter is having a successful time in college at [employer] in [place], where she swims competitively. ? ?Past Medical History:  ?Diagnosis Date  ? Abnormal weight loss   ? Anxiety   ? Arthritis   ? Cataract   ? OU  ? Celiac disease   ? Cervical neck pain with evidence of disc disease   ? patient has a cyst   ? Chronic constipation   ? Chronic diastolic heart failure ([place])   ?  Pt. denies  ? Chronic pain   ? Degenerative disc disease at L5-S1 level   ? with stenosis  ? DVT (deep venous thrombosis) ([place])   ? Right upper arm, bilateral leg  ? Eczema   ? inguinal, feet  ? Elevated liver enzymes   ? 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 Family history of adverse reaction to anesthesia   ? family has problems with anesthesia of nausea and vomiting   ? GERD (gastroesophageal reflux disease)   ? History of in 20's  ? Gluten enteropathy   ? H/O parotitis   ? right   ? Hard of hearing   ? History of kidney stones   ? History of retinal tear   ? Bilateral  ?  History of staph infection   ? required wound vac  ? Hx-TIA (transient ischemic attack)   ? 2015  ? Kidney stones 08/2020  ? LVH (left ventricular hypertrophy) 12/15/2016  ? Mild, noted on ECHO  ? MVP (mitral valve prolapse)   ? NAFL (nonalcoholic fatty liver)   ? Neck pain   ? Neuromuscular disorder ([place])   ? bilateral neuropathy feet.  ? Nuclear sclerotic cataract of both eyes 09/08/2019  ? Pneumonia 12/17/2010  ? Polycythemia   ? Polycythemia, secondary   ? PONV (postoperative nausea and vomiting)   ? Protein C deficiency ([place])   ? Dr. Burr Medico Follows  ? Psoriasis   ? 16 X10 cm psoriatic rash on sole of left foot ; open and occ scant bleeding;   ? psoriatic arthritis   ? PTSD (post-traumatic stress disorder)   ? Scaphoid fracture of wrist 09/23/2013  ? Seizure ([place])   ? childhood, medication until age 34 then weanned completely off  ? Sleep apnea   ? split night study last done by Dr. [name] 06/18/15 shows severe OSA, CSA, and hypersomnia, rec bipap  ? Splenomegaly   ? Stenosis of ureteropelvic junction (UPJ)   ? left  ? Stroke [facility])   ? CVA vs TIA in left cerebrum causing slight right sided weakness-Dr. [name] follows  ? 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 c spine on MRI  ? Tachycardia   ? hx of   ? Transfusion history   ? past history- none recent, after surgeries due to blood loss  ? 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 Wears glasses   ? Wears hearing aid   ? ? ?Past Surgical History:  ?Procedure Laterality Date  ? ABDOMINAL HYSTERECTOMY Bilateral 1994  ? TAH, BSO- tranverse incision at 56 yo  ? ANKLE ARTHROSCOPY WITH RECONSTRUCTION Right 2007  ? CHOLECYSTECTOMY    ? laparoscopic  ? COLONOSCOPY    ? x3  ? EYE SURGERY    ? Left eye 03/02/2018, right 02/15/2018  ? HIP ARTHROSCOPY W/ LABRAL REPAIR Right 05/11/2013  ? acetabular labral tear 03/30/2013  ? KNEE ARTHROPLASTY Right   ? KNEE JOINT MANIPULATION Left   ? x3 under anesthesia  ? KNEE SURGERY Bilateral 1984  ? Right ACL, left PCL repair   ? LITHOTRIPSY  2005  ? LIVER BIOPSY  2013  ? normal results.  ? MASTECTOMY Bilateral   ? prior to 2009  ? MOUTH SURGERY    ? NASAL SEPTUM SURGERY N/A 09/20/2015  ? by ENT Dr. [name]  ? OVARIAN CYST SURGERY Left   ? size of grapefruit, was informed that she had shortened vagina  ? SHOULDER SURGERY Bilateral   ?  Right 08/15/2016, Left 11/15/2016  ? THUMB ARTHROSCOPY Left   ? THYROIDECTOMY, PARTIAL Left 2008  ? TOTAL KNEE ARTHROPLASTY Right 08/23/2018  ? Procedure: TOTAL KNEE ARTHROPLASTY;  Surgeon: [name], MD;  Location: [facility];  Service: Orthopedics;  Laterality: Right;  38mn  ? TOTAL KNEE REVISION Left 02/06/2016  ? Procedure: LEFT TOTAL KNEE REVISION;  Surgeon: FGaynelle Arabian MD;  Location: [facility];  Service: Orthopedics;  Laterality: Left;  ? TOTAL KNEE REVISION Left 04/22/2017  ? Procedure: Left knee polyethylene revision;  Surgeon: AGaynelle Arabian MD;  Location: [facility];  Service: Orthopedics;  Laterality: Left;  ? UPPER GI ENDOSCOPY  2003  ? ? ?Current Medications: ?Current Meds  ?Medication Sig  ? baclofen (LIORESAL) 20 MG tablet Take 1 tablet (20 mg total) by mouth 5 (five) times daily. 1 tablet in morning, midday, dinner and 2 tablets at bedtime  ? Calcium Carbonate Antacid (TUMS ULTRA 1000 PO) Take by mouth daily.  ? calcium elemental as carbonate (BARIATRIC TUMS ULTRA) 400 MG chewable tablet daily.  ? Clobetasol Propionate Emulsion 0.05 % topical foam daily.  ? clorazepate (TRANXENE) 15 MG tablet Take 15 mg by mouth at bedtime.  ? clorazepate (TRANXENE) 7.5 MG tablet Take 7.5 mg by mouth 2 (two) times daily.  ? COSENTYX SENSOREADY, 300 MG, 150 MG/ML SOAJ Inject 2 Syringes into the skin every 28 (twenty-eight) days.  ? desonide (DESOWEN) 0.05 % cream Apply topically 2 (two) times daily.  ? desvenlafaxine (PRISTIQ) 100 MG 24 hr tablet Take 100 mg by mouth daily.  ? ergocalciferol (VITAMIN D2) 1.25 MG (50000 UT) capsule Take 1 capsule by mouth once a week.  ? FIBER SELECT GUMMIES PO Take by mouth daily.  ?  folic acid (FOLVITE) 1 MG tablet Take 1 mg by mouth daily.  ? halobetasol (ULTRAVATE) 0.05 % cream Apply topically.  ? linaclotide (LINZESS) 290 MCG CAPS capsule Take 290 mcg by mouth daily before breakfast.

## 2021-07-03 NOTE — Patient Instructions (Signed)
Medication Instructions:  ?Furosemide: Take 80 mg five days a week ?Furosemide: Take 60 mg two days a week (when you are not taking the 80 mg) ? ?*If you need a refill on your cardiac medications before your next appointment, please call your pharmacy* ? ? ?Lab Work: ?None ordered ?If you have labs (blood work) drawn today and your tests are completely normal, you will receive your results only by: ?MyChart Message (if you have MyChart) OR ?A paper copy in the mail ?If you have any lab test that is abnormal or we need to change your treatment, we will call you to review the results. ? ? ?Testing/Procedures: ?None ordered ? ? ?Follow-Up: ?At [facility], you and your health needs are our priority.  As part of our continuing mission to provide you with exceptional heart care, we have created designated Provider Care Teams.  These Care Teams include your primary Cardiologist (physician) and Advanced Practice Providers (APPs -  Physician Assistants and Nurse Practitioners) who all work together to provide you with the care you need, when you need it. ? ?We recommend signing up for the patient portal called "MyChart".  Sign up information is provided on this After Visit Summary.  MyChart is used to connect with patients for Virtual Visits (Telemedicine).  Patients are able to view lab/test results, encounter notes, upcoming appointments, etc.  Non-urgent messages can be sent to your provider as well.   ?To learn more about what you can do with MyChart, go to [email or web address].   ? ?Your next appointment:   ?6 month(s) ? ?The format for your next appointment:   ?In Person ? ?Provider:   ?Sanda Klein, MD { ? ? ? ? ?

## 2021-07-04 ENCOUNTER — Encounter: Payer: Self-pay | Admitting: Cardiovascular Disease

## 2021-07-04 ENCOUNTER — Ambulatory Visit (INDEPENDENT_AMBULATORY_CARE_PROVIDER_SITE_OTHER): Payer: BC Managed Care – PPO | Admitting: Cardiovascular Disease

## 2021-07-04 DIAGNOSIS — I5032 Chronic diastolic (congestive) heart failure: Secondary | ICD-10-CM

## 2021-07-04 DIAGNOSIS — R000 Tachycardia, unspecified: Secondary | ICD-10-CM | POA: Diagnosis not present

## 2021-07-04 DIAGNOSIS — L4050 Arthropathic psoriasis, unspecified: Secondary | ICD-10-CM

## 2021-07-04 DIAGNOSIS — Z7901 Long term (current) use of anticoagulants: Secondary | ICD-10-CM

## 2021-07-04 DIAGNOSIS — G4731 Primary central sleep apnea: Secondary | ICD-10-CM | POA: Diagnosis not present

## 2021-07-04 DIAGNOSIS — D6859 Other primary thrombophilia: Secondary | ICD-10-CM

## 2021-07-04 NOTE — Progress Notes (Signed)
? ?Cardiology Office Note   ? ?Date:  07/13/2021  ? ?ID:  Jesse Bowers, DOB 05/20/1965, MRN [id number] ? ?PCP:  Concepcion Elk, MD  ?Cardiologist:  Shelva Majestic, MD  ? ?14 month F/U sleep evaluation, initially referred by Dr. Sallyanne Bowers per patient request ? ?History of Present Illness:  ?Jesse Bowers is a 56 y.o. adult transgender male to male on chronic androgen therapy who is followed by Dr. Sallyanne Bowers for cardiology care.  He remotely had seen Dr. Rexene Bowers at [facility] neurology for sleep apnea.  The patient has desired switching to new sleep provider and is referred to me for initiation of care. ? ?[name] has a history of chronic diastolic heart failure, previous stroke on chronic anticoagulation with Xarelto (protein C deficiency) and apparently has a history of sleep apnea.  He states his initial sleep study was done in [place] in 2015.  He has been evaluated at [facility] neurology for his sleep since moving to [place].  Reportedly, on previous evaluation he had obstructive sleep apnea with central apneas and was ultimately placed on ASV mode (EPAP 7 cm with pressure support 4-15 back in 2017.  His last evaluation at [facility] neurology he was dissatisfied and has requested switching providers.  His machine has begun to malfunction and essentially he has not been using therapy.    He has a ResMed air curve 10 ASV unit which reportedly was set at an EPAP of 7 with minimum pressure support 14 with maximum 15.  Over the last 30 days usage was only 1 day and only 1 hour and 24 minutes. ? ?I saw him for initial evaluation with me May 2021.  He  has a history of snoring.  He admits to nonrestorative sleep.  He has had issues with insomnia.  His sleep issues became prominent after he had a stroke.  He does have frequent nocturia and often wakes up with dry mouth or sore throat.  An Epworth Sleepiness Scale score was calculated in the office today and this endorsed at 22 as shown below consistent with severe  excessive daytime sleepiness. ? ?Epworth Sleepiness Scale: ?Situation   Chance of Dozing/Sleeping (0 = never , 1 = slight chance , 2 = moderate chance , 3 = high chance )  ? sitting and reading 3  ? watching TV 3  ? sitting inactive in a public place 3  ? being a passenger in a motor vehicle for an hour or more 3  ? lying down in the afternoon 3  ? sitting and talking to someone 2  ? sitting quietly after lunch (no alcohol) 3  ? while stopped for a few minutes in traffic as the driver 2  ? Total Score  22  ? ?He was typically going to bed between 10 and 11 PM but oftentimes was having difficulty falling asleep and oftentimes waking up in pain.  He denied any hypnagogic hallucinations.  He was unaware of restless legs.  He denied any cataplectic events. ? ?An echo Doppler study done on May 04, 2019 showed an EF of 60 to 65% with moderate concentric LVH and grade 1 diastolic dysfunction. ? ?During that evaluation, I recommended that he obtain a new ASV replacement machine.  Apparently, there was significant delay as result of the Respironics recall but ultimately after much back-and-forth requests, he received a new ResMed air curve 10 ASV unit on April 17, 2020.  [employer] is his DME company.   ? ?I saw him in follow-up  on May 14, 2020.  Upon questioning, typically when he goes to bed he does not put CPAP on and oftentimes waits until he is about to fall asleep before he puts his mask on and turns the machine on.  As result at times he has significant difficulty in sleep initiation.  He has been using CPAP fairly consistently since his set up date but there was a 5-day period where his tubing frame broke and had to wait to get new equipment with tubing for reinstitution of therapy.  A download was obtained since his set up date.  His parameters are set at an EPAP of 7, minimum pressure support of 4 with a maximum pressure support of 15.  His 95th percentile IPAP pressure is 16.8 with a maximum IPAP pressure of  19.6.  AHI is 4.4.  He has not he has noticed that with reinstitution of therapy he feels more alert and has more energy.  He typically goes to bed between 11 and midnight but may take him to between 2 am and 4 am to  fall asleep.  I discussed with him the importance of putting the mask on and turning his machine on before he actually goes into bed such that his sleep onset will be significantly improved and you have significantly increased sleep duration.  As result with his current sleep hygiene, his Epworth Sleepiness Scale score is still elevated at 19.  He did undergo a subsequent echo Doppler study on April 27, 2020 which showed an EF by 3D volume at 61%.  There was grade 1 diastolic dysfunction.  Global longitudinal strain was normal.  There was mild mitral regurgitation.  During his evaluation, I spent significant time with him and discussed improved sleep hygiene, use of CPAP for the nights entirety and that he not go into bed without initiating therapy. ? ?He continues to see Dr. Sallyanne Bowers for cardiology care and last saw him yesterday, Jul 03, 2021.  He was felt to be clinically euvolemic with functional stamina is limited more by orthopedic issues rather than heart failure.  I obtained a download in the office today from April 5 through Jul 04, 2021.  Compliance is suboptimal with usage at only 30% or 9 out of the last 30 days.  Average use was 5 hours and 42 minutes.  His ASV unit was set at an EPAP of 7 with pressure support range from 4 to 15 cm.  AHI was 3.2.  He admits to dry mouth and dry eyes.  As result he was sleeping with gum to prevent mouth dryness.  He states he had not used CPAP for 3 months primarily due to mask issues but resumed using it 2 weeks previously.  He presents for follow-up evaluation. ? ? ?Past Medical History:  ?Diagnosis Date  ? Abnormal weight loss   ? Anxiety   ? Arthritis   ? Cataract   ? OU  ? Celiac disease   ? Cervical neck pain with evidence of disc disease   ?  patient has a cyst   ? Chronic constipation   ? Chronic diastolic heart failure ([place])   ? Pt. denies  ? Chronic pain   ? Degenerative disc disease at L5-S1 level   ? with stenosis  ? DVT (deep venous thrombosis) ([place])   ? Right upper arm, bilateral leg  ? Eczema   ? inguinal, feet  ? Elevated liver enzymes   ? 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 Family  history of adverse reaction to anesthesia   ? family has problems with anesthesia of nausea and vomiting   ? GERD (gastroesophageal reflux disease)   ? History of in 20's  ? Gluten enteropathy   ? H/O parotitis   ? right   ? Hard of hearing   ? History of kidney stones   ? History of retinal tear   ? Bilateral  ? History of staph infection   ? required wound vac  ? Hx-TIA (transient ischemic attack)   ? 2015  ? Kidney stones 08/2020  ? LVH (left ventricular hypertrophy) 12/15/2016  ? Mild, noted on ECHO  ? MVP (mitral valve prolapse)   ? NAFL (nonalcoholic fatty liver)   ? Neck pain   ? Neuromuscular disorder ([place])   ? bilateral neuropathy feet.  ? Nuclear sclerotic cataract of both eyes 09/08/2019  ? Pneumonia 12/17/2010  ? Polycythemia   ? Polycythemia, secondary   ? PONV (postoperative nausea and vomiting)   ? Protein C deficiency ([place])   ? Dr. Burr Medico Follows  ? Psoriasis   ? 16 X10 cm psoriatic rash on sole of left foot ; open and occ scant bleeding;   ? psoriatic arthritis   ? PTSD (post-traumatic stress disorder)   ? Scaphoid fracture of wrist 09/23/2013  ? Seizure ([place])   ? childhood, medication until age 64 then weanned completely off  ? Sleep apnea   ? split night study last done by Dr. [name] 06/18/15 shows severe OSA, CSA, and hypersomnia, rec bipap  ? Splenomegaly   ? Stenosis of ureteropelvic junction (UPJ)   ? left  ? Stroke [facility])   ? CVA vs TIA in left cerebrum causing slight right sided weakness-Dr. [name] follows  ? 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 c spine on MRI  ? Tachycardia   ? hx of   ? Transfusion history   ? past history-  none recent, after surgeries due to blood loss  ? 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 Wears glasses   ? Wears hearing aid   ? ? ?Past Surgical History:  ?Procedure Laterality Date  ? ABDOMIN

## 2021-07-04 NOTE — Patient Instructions (Signed)

## 2021-07-05 ENCOUNTER — Encounter: Payer: Self-pay | Admitting: Cardiovascular Disease

## 2021-07-05 ENCOUNTER — Ambulatory Visit (INDEPENDENT_AMBULATORY_CARE_PROVIDER_SITE_OTHER): Payer: BC Managed Care – PPO | Admitting: Psychologist

## 2021-07-05 DIAGNOSIS — F411 Generalized anxiety disorder: Secondary | ICD-10-CM | POA: Diagnosis not present

## 2021-07-05 DIAGNOSIS — F331 Major depressive disorder, recurrent, moderate: Secondary | ICD-10-CM | POA: Diagnosis not present

## 2021-07-05 NOTE — Progress Notes (Signed)
[place] Counselor/Therapist Progress Note ? ?Patient ID: Jesse Bowers, MRN: [id number],   ? ?Date: 07/05/2021 ? ?Time Spent: 1:04 pm to 1:42 pm; total time: 38 minutes ? ? This session was held via video webex teletherapy due to the coronavirus risk at this time. The patient consented to video teletherapy and was located at his home during this session. He is aware it is the responsibility of the patient to secure confidentiality on his end of the session. The provider was in a private home office for the duration of this session. Limits of confidentiality were discussed with the patient.  ? ?Treatment Type: Individual Therapy ? ?Reported Symptoms: Doing better ? ?Mental Status Exam: ?Appearance:  Casual     ?Behavior: Appropriate  ?Motor: Normal  ?Speech/Language:  Normal Rate  ?Affect: Appropriate  ?Mood: normal  ?Thought process: normal  ?Thought content:   WNL  ?Sensory/Perceptual disturbances:   WNL  ?Orientation: oriented to person, place, and time/date  ?Attention: Good  ?Concentration: Good  ?Memory: WNL  ?Fund of knowledge:  Good  ?Insight:   Fair  ?Judgment:  Fair  ?Impulse Control: Good  ? ?Risk Assessment: ?Danger to Self:  No ?Self-injurious Behavior: No ?Danger to Others: No ?Duty to Warn:no ?Physical Aggression / Violence:No  ?Access to Firearms a concern: No  ?Gang Involvement:No  ? ?Subjective: Patient began the session disclosing that he and his wife Oceanographer) are doing better. He reflected on how they are beginning to have more open communication with each other.  He asked to follow up. He denied suicidal and homicidal ideation.   ? ?Interventions:  Worked on developing a therapeutic relationship with the patient. Used active listening and reflective statements. Provided emotional support using empathy and validation. Used summary statements. Praised patient for doing better and explored what has assisted the patient. Reflected on what will continue to help the patient move in a  positive direction. Used socratic questions to assist the patient. Briefly began to explore where patient can get his social needs met.. Reflected on how we have attempted this before. Assigned homework. Provided empathic statements. Assessed for suicidal and homicidal ideation. ? ?Homework: Reviewing connecting with wife at a deeper level; list of social activities patient can participate in ? ?Next Session: Emotional support and review homework ? ?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 disorder, recurrent, moderate  ? ?Plan:  ? ?Goals ?Alleviate depressive symptoms ?Recognize, accept, and cope with depressive feelings ?Develop healthy thinking patterns ?Develop healthy interpersonal relationships ?Reduce overall frequency, intensity, and duration of anxiety ?Stabilize anxiety level wile increasing ability to function ?Enhance ability to effectively cope with full variety of stressors ?Learn and implement coping skills that result in a reduction of anxiety  ? ?Objectives target date for all objectives is: 09/26/2021 ?Verbalize an understanding of the cognitive, physiological, and behavioral components of anxiety ?Learning and implement calming skills to reduce overall anxiety ?Verbalize an understanding of the role that cognitive biases play in excessive irrational worry and persistent anxiety symptoms ?Identify, challenge, and replace based fearful talk ?Learn and implement problem solving strategies ?Identify and engage in pleasant activities ?Learning and implement personal and interpersonal skills to reduce anxiety and improve interpersonal relationships ?Learn to accept limitations in life and commit to tolerating, rather than avoiding, unpleasant emotions while accomplishing meaningful goals ?Identify major life conflicts from the past and present that form the basis for present anxiety ?Maintain involvement in work, family, and social activities ?Reestablish a  consistent sleep-wake cycle ?Cooperate  with a medical evaluation  ?Cooperate with a medication evaluation by a physician ?Verbalize an accurate understanding of depression ?Verbalize an understanding of the treatment ?Identify and replace thoughts that support depression ?Learn and implement behavioral strategies ?Verbalize an understanding and resolution of current interpersonal problems ?Learn and implement problem solving and decision making skills ?Learn and implement conflict resolution skills to resolve interpersonal problems ?Verbalize an understanding of healthy and unhealthy emotions verbalize insight into how past relationships may be influence current experiences with depression ?Use mindfulness and acceptance strategies and increase value based behavior  ?Increase hopeful statements about the future.  ?Interventions ?Engage the patient in behavioral activation ?Use instruction, modeling, and role-playing to build the client's general social, communication, and/or conflict resolution skills ?Use Acceptance and Commitment Therapy to help client accept uncomfortable realities in order to accomplish value-consistent goals ?Reinforce the client's insight into the role of his/her past emotional pain and present anxiety  ?Support the client in following through with work, family, and social activities ?Teach and implement sleep hygiene practices  ?Refer the patient to a physician for a psychotropic medication consultation ?Monito the clint's psychotropic medication compliance ?Discuss how anxiety typically involves excessive worry, various bodily expressions of tension, and avoidance of what is threatening that interact to maintain the problem  ?Teach the patient relaxation skills ?Assign the patient homework ?Discuss examples demonstrating that unrealistic worry overestimates the probability of threats and underestimates patient's ability  ?Assist the patient in analyzing his or her worries ?Help patient  understand that avoidance is reinforcing  ?Consistent with treatment model, discuss how change in cognitive, behavioral, and interpersonal can help client alleviate depression ?CBT ?Behavioral activation help the client explore the relationship, nature of the dispute,  ?Help the client develop new interpersonal skills and relationships ?Conduct Problem solving therapy ?Teach conflict resolution skills ?Use a process-experiential approach ?Conduct TLDP ?Conduct ACT ?Evaluate need for psychotropic medication ?Monitor adherence to medication  ? ?Patient agreed and reviewed the treatment plan on 10/03/2020 ? ?Conception Chancy, PsyD ? ?

## 2021-07-06 ENCOUNTER — Encounter: Payer: Self-pay | Admitting: Cardiovascular Disease

## 2021-07-08 ENCOUNTER — Ambulatory Visit (INDEPENDENT_AMBULATORY_CARE_PROVIDER_SITE_OTHER): Payer: BC Managed Care – PPO | Admitting: Ophthalmology

## 2021-07-08 DIAGNOSIS — H35411 Lattice degeneration of retina, right eye: Secondary | ICD-10-CM

## 2021-07-08 DIAGNOSIS — H35073 Retinal telangiectasis, bilateral: Secondary | ICD-10-CM | POA: Diagnosis not present

## 2021-07-08 DIAGNOSIS — H35413 Lattice degeneration of retina, bilateral: Secondary | ICD-10-CM | POA: Diagnosis not present

## 2021-07-08 DIAGNOSIS — H35412 Lattice degeneration of retina, left eye: Secondary | ICD-10-CM

## 2021-07-08 NOTE — Assessment & Plan Note (Addendum)
Patient had a period of 3 months off BiPAP,  Patient reports that the symptoms of spot in his vision have been really unchanged now for months this is coincident with difficulty with compliance of BiPAP use due to poor fitting of the apparatus which only last week is improved with some changes made once he visited with the proper specialist regarding this issue.  Finally getting a good night sleep  Does however say that his average oxygen at night has been good as he uses the aura ring although he does not use continuous oximetry.  The aura ring notably only allows for averages of oxygen at night.  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  There is no coincident accumulation of CME centrally in either eye by OCT today however  Recent enhance settings with BiPAP.

## 2021-07-08 NOTE — Assessment & Plan Note (Signed)
OD with good retinopexy, no new breaks today ?

## 2021-07-08 NOTE — Assessment & Plan Note (Signed)
OS with good retinopexy no new breaks today ?

## 2021-07-08 NOTE — Progress Notes (Addendum)
07/08/2021     CHIEF COMPLAINT Patient presents for  Chief Complaint  Patient presents with   Retina Follow Up      HISTORY OF PRESENT ILLNESS: Jesse Bowers is a 56 y.o.  Man who presents to the clinic today for:   HPI     Retina Follow Up           Diagnosis: Other   Laterality: both eyes         Comments   1 year FU OU OCT. Pt stated, "I sent a message on MyChart in regard to eye pain, the left eye worse, double vision, blurry vision, vision is clear in the center of the eye. Out is not clear. Some parts the center is very cloudy. If I'm reading or doing a puzzle, my eyes are done for."  Patient believes it is mainly in the left eye but he is not certain but only when he checked vision here was he seeing what looked like a spot Pt described eye pain as unbearable. Pt is currently taking RESTASIS- 2x daily   Patient reports that the symptoms of spot in his vision have been really unchanged now for months this is coincident with difficulty with compliance of BiPAP use due to poor fitting of the apparatus which only last week is improved with some changes made once he visited with the proper specialist regarding this issue.  Finally getting a good night sleep  Does however say that his average oxygen at night has been good as he uses the aura ring although he does not use continuous oximetry.  The aura ring notably only allows for averages of oxygen at night.  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  There is no coincident accumulation of CME centrally in either eye by OCT today however          Last edited by Hurman Horn, MD on 08/05/2021  8:10 AM.      Referring physician: Debbra Riding, MD [address] [place],   [id number]  HISTORICAL INFORMATION:    Selected notes from the MEDICAL RECORD NUMBER       CURRENT MEDICATIONS: Current Outpatient Medications (Ophthalmic Drugs)  Medication Sig   RESTASIS 0.05 % ophthalmic emulsion 1 drop 2 (two) times daily.   No current facility-administered medications for this visit. (Ophthalmic Drugs)   Current Outpatient Medications (Other)  Medication Sig   acyclovir ointment (ZOVIRAX) 5 % as needed.   baclofen (LIORESAL) 20 MG tablet Take 1 tablet (20 mg total) by mouth 5 (five) times daily. 1 tablet in morning, midday, dinner and 2 tablets at bedtime   Calcipotriene-Betameth Diprop 0.005-0.064 % FOAM Apply 1 application. topically 2 (two) times daily as needed.   Calcium Carbonate Antacid (TUMS ULTRA 1000 PO) Take by mouth daily.   calcium elemental as carbonate (BARIATRIC TUMS ULTRA) 400 MG chewable tablet daily.   ciprofloxacin-dexamethasone (CIPRODEX) OTIC suspension    clindamycin (CLEOCIN) 300 MG capsule Take 300 mg by mouth 3 (three) times daily.   Clobetasol Propionate Emulsion 0.05 % topical foam daily.   clorazepate (TRANXENE) 15 MG tablet Take 15 mg by mouth at bedtime.   clorazepate (TRANXENE) 7.5 MG tablet Take 7.5 mg by mouth 2 (two) times daily.   COSENTYX SENSOREADY, 300 MG, 150 MG/ML SOAJ Inject 2 Syringes into  the skin every 28 (twenty-eight) days.   desonide (DESOWEN) 0.05 % cream Apply topically 2 (two) times daily.   desvenlafaxine (PRISTIQ) 100 MG 24 hr tablet Take 100 mg by mouth daily.   ergocalciferol (VITAMIN D2) 1.25 MG (50000 UT) capsule Take 1 capsule by mouth once a week.   FIBER SELECT GUMMIES PO Take by mouth daily.   folic acid (FOLVITE) 1 MG tablet Take 1 mg by mouth daily.   furosemide (LASIX) 40 MG tablet Take 60 mg (one and a half tablets) once daily two days a week (the days you do not take 80 mg)   furosemide (LASIX) 80 MG tablet Take 80 mg once daily five days a week   halobetasol (ULTRAVATE) 0.05 % cream Apply topically.   linaclotide (LINZESS) 290 MCG CAPS  capsule Take 290 mcg by mouth daily before breakfast.   methotrexate (RHEUMATREX) 2.5 MG tablet Take 15 mg by mouth once a week. Caution:Chemotherapy. Protect from light.   metoprolol succinate (TOPROL-XL) 25 MG 24 hr tablet Take one tablet every evening at 10 pm.   morphine (KADIAN) 20 MG 24 hr capsule SMARTSIG:1 Capsule(s) By Mouth Morning-Night   morphine (KADIAN) 30 MG 24 hr capsule SMARTSIG:1 Capsule(s) By Mouth Morning-Night   Multiple Vitamins-Minerals (ALIVE MULTI-VITAMIN) CHEW    Oxycodone HCl 10 MG TABS Take 1 tablet (10 mg total) by mouth every 8 (eight) hours as needed (pain).   polyethylene glycol powder (GLYCOLAX/MIRALAX) 17 GM/SCOOP powder Purelax 17 gram/dose oral powder  TAKE 1 CAPFUL MIXED INTO 8 OZ OF CLEAR LIQUID UP TO FOUR TIMES A DAY AS NEEDED   potassium chloride (KLOR-CON) 10 MEQ tablet Take by mouth.   testosterone cypionate (DEPOTESTOSTERONE CYPIONATE) 200 MG/ML injection INJECT 0.5 MLS (100 MG TOTAL) INTO THE MUSCLE ONCE A WEEK.   ursodiol (ACTIGALL) 250 MG tablet Take 250 mg by mouth 2 (two) times daily with a meal.    ursodiol (ACTIGALL) 500 MG tablet Take 1 tablet midday with lunch and 1 tablet in the evening with dinneer   XARELTO 20 MG TABS tablet TAKE 1 TABLET(20 MG) BY MOUTH EVERY NIGHT AT BEDTIME   No current facility-administered medications for this visit. (Other)      REVIEW OF SYSTEMS: ROS   Negative for: Constitutional, Gastrointestinal, Neurological, Skin, Genitourinary, Musculoskeletal, HENT, Endocrine, Cardiovascular, Eyes, Respiratory, Psychiatric, Allergic/Imm, Heme/Lymph Last edited by Silvestre Moment on 07/08/2021  1:50 PM.       ALLERGIES Allergies  Allergen Reactions   Gabapentin Nausea Only and Other (See Comments)    Other reaction(s): nausea, mental status Drowsiness and restlessness. Pt. States, " It makes me crazy, I can't take this medicine." Other reaction(s): Other (See Comments) Tardive dyskinesia   Penicillin G Anaphylaxis and Other  (See Comments)    Has patient had a PCN reaction causing immediate rash, facial/tongue/throat swelling, SOB or lightheadedness with hypotension: Yes Has patient had a PCN reaction causing severe rash involving mucus membranes or skin necrosis: No Has patient had a PCN reaction that required hospitalization Yes Has patient had a PCN reaction occurring within the last 10 years: No If all of the above answers are "NO", then may proceed with Cephalosporin use.    Sulfa Antibiotics Rash    Stevens-Johnson rash   Nortriptyline Other (See Comments)    Dry mouth at 25 mg dose.  Tolerates 10 mg dose Other reaction(s): Other (See Comments) Dry Mouth   Pregabalin Other (See Comments)    Ineffective Other reaction(s): Other (See Comments) Tardive dyskinesia  Tegaderm Ag Mesh [Silver] Dermatitis    Causes blistering wounds    Ibuprofen Other (See Comments)    Contraindicated with Xarelto.    Sulfacetamide Sodium-Sulfur Rash    PAST MEDICAL HISTORY Past Medical History:  Diagnosis Date   Abnormal weight loss    Anxiety    Arthritis    Cataract    OU   Celiac disease    Cervical neck pain with evidence of disc disease    patient has a cyst    Chronic constipation    Chronic diastolic heart failure (HCC)    Pt. 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   Family history of adverse reaction to anesthesia    family has problems with anesthesia of nausea and vomiting    GERD (gastroesophageal reflux disease)    History of in 20's   Gluten enteropathy    H/O parotitis    right    Hard of hearing    History of kidney stones    History of retinal tear    Bilateral   History of staph infection    required wound vac   Hx-TIA (transient ischemic attack)    2015    Kidney stones 08/2020   LVH (left ventricular hypertrophy) 12/15/2016   Mild, noted on ECHO   MVP (mitral valve prolapse)    NAFL (nonalcoholic fatty liver)    Neck pain    Neuromuscular disorder ([place])    bilateral neuropathy feet.   Nuclear sclerotic cataract of both eyes 09/08/2019   Pneumonia 12/17/2010   Polycythemia    Polycythemia, secondary    PONV (postoperative nausea and vomiting)    Protein C deficiency (HCC)    Dr. Anne Fu   Psoriasis    16 X10 cm psoriatic rash on sole of left foot ; open and occ scant bleeding;    psoriatic arthritis    PTSD (post-traumatic stress disorder)    Scaphoid fracture of wrist 09/23/2013   Seizure ([place])    childhood, medication until age 28 then weanned completely off   Sleep apnea    split night study last done by Dr. 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    Wears glasses    Wears hearing aid    Past Surgical History:  Procedure Laterality Date   ABDOMINAL HYSTERECTOMY Bilateral 1994   TAH, BSO- tranverse incision at 56 yo   ANKLE ARTHROSCOPY WITH RECONSTRUCTION Right 2007   CHOLECYSTECTOMY     laparoscopic   COLONOSCOPY     x3   EYE SURGERY     Left eye 03/02/2018, right 02/15/2018   HIP ARTHROSCOPY W/ LABRAL REPAIR Right 05/11/2013   acetabular labral tear 03/30/2013   KNEE  ARTHROPLASTY Right    KNEE JOINT MANIPULATION Left    x3 under anesthesia   KNEE SURGERY Bilateral 1984   Right ACL, left PCL repair   LITHOTRIPSY  2005   LIVER BIOPSY  2013   normal results.   MASTECTOMY Bilateral    prior to  2009   MOUTH SURGERY     NASAL SEPTUM SURGERY N/A 09/20/2015   by ENT Dr. [name]   OVARIAN CYST SURGERY Left    size of grapefruit, was informed that she had shortened vagina   SHOULDER SURGERY Bilateral    Right 08/15/2016, Left 11/15/2016   THUMB ARTHROSCOPY Left    THYROIDECTOMY, PARTIAL Left 2008   TOTAL KNEE ARTHROPLASTY Right 08/23/2018   Procedure: TOTAL KNEE ARTHROPLASTY;  Surgeon: [name], MD;  Location: [facility];  Service: Orthopedics;  Laterality: Right;  16mn   TOTAL KNEE REVISION Left 02/06/2016   Procedure: LEFT TOTAL KNEE REVISION;  Surgeon: FGaynelle Arabian MD;  Location: [facility];  Service: Orthopedics;  Laterality: Left;   TOTAL KNEE REVISION Left 04/22/2017   Procedure: Left knee polyethylene revision;  Surgeon: AGaynelle Arabian MD;  Location: [facility];  Service: Orthopedics;  Laterality: Left;   UPPER GI ENDOSCOPY  2003    FAMILY HISTORY Family History  Problem Relation Age of Onset   Stroke Maternal Grandfather        594  Heart attack Maternal Grandfather    Glaucoma Maternal Grandfather    Macular degeneration Maternal Grandfather    Breast cancer Sister    Hypertension Mother    Psoriasis Mother    Other Mother        meningioma developed ~2019   Glaucoma Mother    Cancer Paternal Grandfather    Heart attack Paternal Grandfather    Stroke Paternal Uncle        age 56  Polycythemia Paternal Uncle    Stroke Maternal Grandmother    Congestive Heart Failure Maternal Grandmother    Heart attack Maternal Grandmother    Protein C deficiency Sister 353      Miscarriages   Breast cancer Maternal Aunt 35    SOCIAL HISTORY Social History   Tobacco Use   Smoking status: Never   Smokeless tobacco: Never  Vaping Use   Vaping Use: Never used  Substance Use Topics   Alcohol use: Yes    Comment: social   Drug use: No         OPHTHALMIC EXAM:  Base Eye Exam     Visual Acuity (ETDRS)       Right Left   Dist [place] 20/40 20/40 -2   Dist ph [place] 20/30  20/30         Tonometry (Tonopen, 2:02 PM)       Right Left   Pressure 15 17         Pupils       Pupils APD   Right PERRL None   Left PERRL None         Visual Fields       Left Right    Full Full         Extraocular Movement       Right Left    Full Full         Neuro/Psych     Oriented x3: Yes   Mood/Affect: Normal         Dilation     Both eyes: 1.0% Mydriacyl, 2.5% Phenylephrine @ 2:02 PM  Slit Lamp and Fundus Exam     External Exam       Right Left   External Normal Normal         Slit Lamp Exam       Right Left   Lids/Lashes Normal Normal   Conjunctiva/Sclera White and quiet White and quiet   Cornea Clear Clear   Anterior Chamber Deep and quiet Deep and quiet   Iris Round and reactive Round and reactive   Lens Centered posterior chamber intraocular lens Centered posterior chamber intraocular lens   Anterior Vitreous Normal Normal         Fundus Exam       Right Left   Posterior Vitreous Normal Normal   Disc Normal Normal   C/D Ratio 0.2 0.2   Macula Normal Normal   Vessels Normal Normal   Periphery Lattice generation superonasal, inferonasal and inferotemporal each with good laser photocoagulation reaction. Patient inferotemporal no treatment needed at 530, pexy around lattice degeneration superotemporal.            IMAGING AND PROCEDURES  Imaging and Procedures for 08/05/21  OCT, Retina - OU - Both Eyes       Right Eye Quality was good. Scan locations included subfoveal. Central Foveal Thickness: 336. Progression has been stable. Findings include normal foveal contour.   Left Eye Quality was good. Scan locations included subfoveal. Central Foveal Thickness: 291. Progression has been stable. Findings include normal foveal contour.   Notes   Very minor micro-CME may be seen in the inner perifoveal regions but these are not pathologic, no active CME accumulated no intraretinal fluid.  I  encourage patient to continue on the BiPAP/CPAP whichever mask works best                 ASSESSMENT/PLAN:  Retinal telangiectasis of both eyes Patient had a period of 3 months off BiPAP,  Patient reports that the symptoms of spot in his vision have been really unchanged now for months this is coincident with difficulty with compliance of BiPAP use due to poor fitting of the apparatus which only last week is improved with some changes made once he visited with the proper specialist regarding this issue.  Finally getting a good night sleep  Does however say that his average oxygen at night has been good as he uses the aura ring although he does not use continuous oximetry.  The aura ring notably only allows for averages of oxygen at night.  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  There is no coincident accumulation of CME centrally in either eye by OCT today however  Recent enhance settings with BiPAP.  Lattice degeneration, right eye OD with good retinopexy, no new breaks today  Lattice degeneration of peripheral retina, left OS with good retinopexy no new breaks today     ICD-10-CM   1. Retinal telangiectasis of both eyes  H35.073 OCT, Retina - OU - Both Eyes    2. Lattice degeneration, right eye  H35.411     3. Lattice degeneration of peripheral retina, left  H35.412       1.  No obvious corneal disease.  Patient may have dry eyes ongoing Restasis use, follow-up Dr. Wyatt Portela as scheduled 2.  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  This could be a  relative scotoma and he may in fact benefit from [facility] visual fields or similar, 10-2 central to look for signs of a paracentral scotoma.  3.  On OCT no accumulated CME no intraretinal fluid thus no severe  endorgan damage from MAC-TEL.  Patient is likely to benefit from recent adjustments to his settings with BiPAP because he is now sleeping better.  Likely that this will also benefit his symptoms that been present for 6 months coincident with poorly functioning BiPAP  Ophthalmic Meds Ordered this visit:  No orders of the defined types were placed in this encounter.      Return in about 3 months (around 10/08/2021) for COLOR FP, OCT, DILATE OU.  There are no Patient Instructions on file for this visit.   Explained the diagnoses, plan, and follow up with the patient and they expressed understanding.  Patient expressed understanding of the importance of proper follow up care.   Clent Demark Mekala Winger M.D. Diseases & Surgery of the Retina and Vitreous [facility]ina [facility] Di[facility] [place] 08/05/21     Abbreviations: M myopia (nearsighted); A astigmatism; H hyperopia (farsighted); P presbyopia; Mrx spectacle prescription;  CTL contact lenses; OD right eye; OS left eye; OU both eyes  XT exotropia; ET esotropia; PEK punctate epithelial keratitis; PEE punctate epithelial erosions; DES dry eye syndrome; MGD meibomian gland dysfunction; ATs artificial tears; PFAT's preservative free artificial tears; [place] nuclear sclerotic cataract; PSC posterior subcapsular cataract; ERM epi-retinal membrane; PVD posterior vitreous detachment; RD retinal detachment; DM diabetes mellitus; DR diabetic retinopathy; NPDR non-proliferative diabetic retinopathy; PDR proliferative diabetic retinopathy; CSME clinically significant macular edema; DME diabetic macular edema; dbh dot blot hemorrhages; CWS cotton wool spot; POAG primary open angle glaucoma; C/D cup-to-disc ratio; HVF humphrey visual field; GVF goldmann visual field; OCT optical coherence tomography; IOP intraocular pressure; BRVO Branch retinal vein occlusion; CRVO central retinal vein occlusion; CRAO central retinal artery occlusion; BRAO branch retinal artery occlusion; RT  retinal tear; SB scleral buckle; PPV pars plana vitrectomy; VH Vitreous hemorrhage; PRP panretinal laser photocoagulation; IVK intravitreal kenalog; VMT vitreomacular traction; MH Macular hole;  NVD neovascularization of the disc; NVE neovascularization elsewhere; AREDS age related eye disease study; ARMD age related macular degeneration; POAG primary open angle glaucoma; EBMD epithelial/anterior basement membrane dystrophy; ACIOL anterior chamber intraocular lens; IOL intraocular lens; PCIOL posterior chamber intraocular lens; Phaco/IOL phacoemulsification with intraocular lens placement; [place] photorefractive keratectomy; LASIK laser assisted in situ keratomileusis; HTN hypertension; DM diabetes mellitus; COPD chronic obstructive pulmonary disease

## 2021-07-09 ENCOUNTER — Encounter: Payer: Self-pay | Admitting: Cardiovascular Disease

## 2021-07-10 ENCOUNTER — Encounter: Payer: Self-pay | Admitting: Hematology

## 2021-07-12 ENCOUNTER — Inpatient Hospital Stay: Payer: BC Managed Care – PPO

## 2021-07-12 ENCOUNTER — Inpatient Hospital Stay: Payer: BC Managed Care – PPO | Admitting: Hematology

## 2021-07-13 ENCOUNTER — Encounter: Payer: Self-pay | Admitting: Cardiovascular Disease

## 2021-07-19 ENCOUNTER — Ambulatory Visit (INDEPENDENT_AMBULATORY_CARE_PROVIDER_SITE_OTHER): Payer: BC Managed Care – PPO | Admitting: Psychologist

## 2021-07-19 DIAGNOSIS — F331 Major depressive disorder, recurrent, moderate: Secondary | ICD-10-CM

## 2021-07-19 DIAGNOSIS — F411 Generalized anxiety disorder: Secondary | ICD-10-CM

## 2021-07-19 NOTE — Progress Notes (Signed)
[place] Counselor/Therapist Progress Note  Patient ID: Drakkar Medeiros, MRN: [id number],    Date: 07/19/2021  Time Spent: 1:02 pm to 1:45 pm; total time: 43 minutes   This session was held via video webex teletherapy due to the coronavirus risk at this time. The patient consented to video teletherapy and was located at his home during this session. He is aware it is the responsibility of the patient to secure confidentiality on his end of the session. The provider was in a private home office for the duration of this session. Limits of confidentiality were discussed with the patient.   Treatment Type: Individual Therapy  Reported Symptoms: Doing better  Mental Status Exam: Appearance:  Casual     Behavior: Appropriate  Motor: Normal  Speech/Language:  Normal Rate  Affect: Appropriate  Mood: normal  Thought process: normal  Thought content:   WNL  Sensory/Perceptual disturbances:   WNL  Orientation: oriented to person, place, and time/date  Attention: Good  Concentration: Good  Memory: WNL  Fund of knowledge:  Good  Insight:   Fair  Judgment:  Fair  Impulse Control: Good   Risk Assessment: Danger to Self:  No Self-injurious Behavior: No Danger to Others: No Duty to Warn:no Physical Aggression / Violence:No  Access to Firearms a concern: No  Gang Involvement:No   Subjective: Patient began the session the patient described himself as doing well and talked about what has helped. He reflected on what he is looking forward to as Tess leaves her job and moves into a new one. He acknowledged doing better and processed what will help him continue to do well. He ended the session voicing that he would prefer never to leave his house if possible. He acknowledged that control is something that he struggles with regularly. He asked to follow up. He denied suicidal and homicidal ideation.    Interventions:  Worked on developing a therapeutic relationship with the patient.  Used active listening and reflective statements. Provided emotional support using empathy and validation. Praised the patient for doing well. Facilitated discussion regarding what has assisted the patient. Processed what will help the patient continue moving in a positive direction. Used socratic questions to assist the patient gain insight into self. Challenged some of the thoughts expressed. Assisted in problem solving. Processed thoughts and emotions. Identified the theme of control. Validated concerns. Challenged that attempting to control everything may be exacerbating symptoms. Assigned homework. Provided empathic statements. Assessed for suicidal and homicidal ideation.  Homework: Continue to work connect with wife  Next Session: Emotional support, review homework, and process giving up control.   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 disorder, recurrent, moderate   Plan:   Goals Alleviate depressive symptoms Recognize, accept, and cope with depressive feelings Develop healthy thinking patterns Develop healthy interpersonal relationships Reduce overall frequency, intensity, and duration of anxiety Stabilize anxiety level wile increasing ability to function Enhance ability to effectively cope with full variety of stressors Learn and implement coping skills that result in a reduction of anxiety   Objectives target date for all objectives is: 09/26/2021 Verbalize an understanding of the cognitive, physiological, and behavioral components of anxiety Learning and implement calming skills to reduce overall anxiety Verbalize an understanding of the role that cognitive biases play in excessive irrational worry and persistent anxiety symptoms Identify, challenge, and replace based fearful talk Learn and implement problem solving strategies Identify and engage in pleasant activities Learning and implement personal and interpersonal skills to reduce  anxiety and improve interpersonal relationships Learn to accept limitations in life and commit to tolerating, rather than avoiding, unpleasant emotions while accomplishing meaningful goals Identify major life conflicts from the past and present that form the basis for present anxiety Maintain involvement in work, family, and social activities Reestablish a consistent sleep-wake cycle Cooperate with a medical evaluation  Cooperate with a medication evaluation by a physician Verbalize an accurate understanding of depression Verbalize an understanding of the treatment Identify and replace thoughts that support depression Learn and implement behavioral strategies Verbalize an understanding and resolution of current interpersonal problems Learn and implement problem solving and decision making skills Learn and implement conflict resolution skills to resolve interpersonal problems Verbalize an understanding of healthy and unhealthy emotions verbalize insight into how past relationships may be influence current experiences with depression Use mindfulness and acceptance strategies and increase value based behavior  Increase hopeful statements about the future.  Interventions Engage the patient in behavioral activation Use instruction, modeling, and role-playing to build the client's general social, communication, and/or conflict resolution skills Use Acceptance and Commitment Therapy to help client accept uncomfortable realities in order to accomplish value-consistent goals Reinforce the client's insight into the role of his/her past emotional pain and present anxiety  Support the client in following through with work, family, and social activities Teach and implement sleep hygiene practices  Refer the patient to a physician for a psychotropic medication consultation Monito the clint's psychotropic medication compliance Discuss how anxiety typically involves excessive worry, various bodily  expressions of tension, and avoidance of what is threatening that interact to maintain the problem  Teach the patient relaxation skills Assign the patient homework Discuss examples demonstrating that unrealistic worry overestimates the probability of threats and underestimates patient's ability  Assist the patient in analyzing his or her worries Help patient understand that avoidance is reinforcing  Consistent with treatment model, discuss how change in cognitive, behavioral, and interpersonal can help client alleviate depression CBT Behavioral activation help the client explore the relationship, nature of the dispute,  Help the client develop new interpersonal skills and relationships Conduct Problem solving therapy Teach conflict resolution skills Use a process-experiential approach Conduct TLDP Conduct ACT Evaluate need for psychotropic medication Monitor adherence to medication   Patient agreed and reviewed the treatment plan on 10/03/2020  Conception Chancy, PsyD

## 2021-07-21 ENCOUNTER — Ambulatory Visit
Admission: EM | Admit: 2021-07-21 | Discharge: 2021-07-21 | Disposition: A | Discharge status: Home / Self Care | Payer: BC Managed Care – PPO

## 2021-07-21 ENCOUNTER — Encounter: Payer: Self-pay | Admitting: Emergency Medicine

## 2021-07-21 DIAGNOSIS — H6093 Unspecified otitis externa, bilateral: Secondary | ICD-10-CM

## 2021-07-21 MED ORDER — CIPROFLOXACIN HCL 0.2 % OT SOLN: 0.2000 mL | Freq: Two times a day (BID) | OTIC | 0 refills | Status: AC

## 2021-07-21 MED ORDER — CIPROFLOXACIN HCL 500 MG PO TABS: 500.0000 mg | ORAL_TABLET | Freq: Two times a day (BID) | ORAL | 0 refills | Status: AC

## 2021-07-21 NOTE — ED Provider Notes (Addendum)
[facility]    CSN: [id number] Arrival date & time: 07/21/21  1524    HISTORY   Chief Complaint  Patient presents with   Otalgia   HPI Pt presents to UC c/o bilateral ear pain with psoriasis inside of the ear x 1 week. States it began to get so bad that they are dizzy and lightheaded and nauseous and they have begun to vomit. Has tried ciprodex gtt QID with fluocinolone gtt and suctioning of EACs without relief. Pt adds that they are immunocompromised and has been on clindamycin for the past for days for treatment of "spider bites".    The history is provided by the patient.  Otalgia  Past Medical History:  Diagnosis Date   Abnormal weight loss    Anxiety    Arthritis    Cataract    OU   Celiac disease    Cervical neck pain with evidence of disc disease    patient has a cyst    Chronic constipation    Chronic diastolic heart failure (HCC)    Pt. 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   Family history of adverse reaction to anesthesia    family has problems with anesthesia of nausea and vomiting    GERD (gastroesophageal reflux disease)    History of in 20's   Gluten enteropathy    H/O parotitis    right    Hard of hearing    History of kidney stones    History of retinal tear    Bilateral   History of staph infection    required wound vac   Hx-TIA (transient ischemic attack)    2015   Kidney stones 08/2020   LVH (left ventricular hypertrophy) 12/15/2016   Mild, noted on ECHO   MVP (mitral valve prolapse)    NAFL (nonalcoholic fatty liver)    Neck pain    Neuromuscular disorder ([place])    bilateral neuropathy feet.   Nuclear sclerotic cataract of both eyes 09/08/2019   Pneumonia 12/17/2010   Polycythemia    Polycythemia,  secondary    PONV (postoperative nausea and vomiting)    Protein C deficiency (HCC)    Dr. Anne Fu   Psoriasis    16 X10 cm psoriatic rash on sole of left foot ; open and occ scant bleeding;    psoriatic arthritis    PTSD (post-traumatic stress disorder)    Scaphoid fracture of wrist 09/23/2013   Seizure ([place])    childhood, medication until age 14 then weanned completely off   Sleep apnea    split night study last done by Dr. [name] 06/18/15 shows severe OSA, CSA, and hypersomnia, rec bipap   Splenomegaly    Stenosis of ureteropelvic junction (UPJ)    left   Stroke [facility])    CVA vs TIA in left cerebrum causing slight right sided weakness-Dr. [name] follows   Syrinx of spinal cord ([place]) 01/06/2014   c spine on MRI   Tachycardia    hx of    Transfusion history    past history- none recent, after surgeries due to blood loss   Transgender with history of gender affirmation surgery    Wears glasses  Wears hearing aid    Patient Active Problem List   Diagnosis Date Noted   Spondylosis of lumbar spine 10/03/2020   Pseudophakia of both eyes 05/07/2020   Retinal telangiectasis of both eyes 05/07/2020   Lattice degeneration of peripheral retina, left 09/08/2019   Lattice degeneration, right eye 09/08/2019   Chronic diastolic heart failure ([place]) 05/24/2019   Urinary dysfunction 04/12/2019   Osteoarthritis of right knee 08/23/2018   Constipation due to opioid therapy 03/30/2018   SNHL (sensorineural hearing loss) 12/04/2017   Abnormal urinary stream 12/03/2017   Osteoarthritis of carpometacarpal (CMC) joint of thumb 11/30/2017   Muscle weakness 11/17/2017   Transient vision disturbance 11/12/2017   Bilateral hand pain 10/30/2017   Pain of left hip joint 10/09/2017   Gynecomastia 07/10/2017   Iron deficiency anemia 07/05/2017   Spasticity 05/20/2017   Lumbar radiculitis 04/20/2017   Ulnar 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, left 11/28/2016   Idiopathic peripheral neuropathy 11/28/2016    Failed total knee arthroplasty, 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   Long term (current) use of anticoagulants 08/23/2015   Right upper quadrant abdominal pain 08/23/2015   Memory loss 05/10/2015   Gait abnormality 04/07/2015   Alkaline phosphatase elevation 04/07/2015   History of thrombosis 03/26/2015   Medial epicondylitis 02/07/2015   Cognitive decline 12/21/2014   Leukopenia 12/05/2014   Rotator cuff syndrome of right shoulder 10/27/2014   Status post left knee replacement 08/22/2014   Left lateral epicondylitis 08/22/2014   Chronic cerebral ischemia 08/18/2014   Arthrofibrosis of knee joint 08/17/2014   Cubital canal compression syndrome, left 08/17/2014   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   Chronic pain syndrome 04/10/2014   Insomnia 04/10/2014   Chronic non-specific white matter lesions on MRI 04/10/2014   CFS (chronic fatigue syndrome) 04/10/2014   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[place]) 03/01/2014   Biceps tendonitis on left 03/01/2014   Polycythemia vera ([place]) 12/27/2013   H/O TIA (transient ischemic attack) and stroke 12/27/2013   Neck pain 12/27/2013   OSA (obstructive sleep apnea) 12/08/2013   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/31/2013   Depression with anxiety 08/04/2013   Protein C deficiency ([place]) 08/01/2013   Post traumatic stress disorder (PTSD) 08/01/2013   Speech abnormality 07/25/2013   Obesity 05/10/2013   Lower extremity edema 05/10/2013   GERD (gastroesophageal reflux disease) 05/10/2013   Arthritis 05/10/2013   OA (osteoarthritis) of knee 03/15/2013   Headache 10/25/2012   Palpitations 10/18/2012   Fatty liver determined by biopsy 06/01/2012   Arthropathic psoriasis, unspecified ([place]) 04/29/2012   Abnormal liver enzymes 03/29/2012   Psoriatic arthritis ([place]) 12/29/2011   Left Renal Hydronephrosis 12/11/2010   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   Celiac disease 05/27/2010   Thyroid nodule 05/27/2010   Male-to-male transgender person 09/20/2002   Past  Surgical History:  Procedure Laterality Date   ABDOMINAL HYSTERECTOMY Bilateral 1994   TAH, BSO- tranverse incision at 56 yo   ANKLE ARTHROSCOPY WITH RECONSTRUCTION Right 2007   CHOLECYSTECTOMY     laparoscopic   COLONOSCOPY     x3   EYE SURGERY     Left eye 03/02/2018, right 02/15/2018   HIP ARTHROSCOPY W/ LABRAL REPAIR Right 05/11/2013   acetabular labral tear 03/30/2013   KNEE ARTHROPLASTY Right    KNEE JOINT MANIPULATION Left    x3 under anesthesia   KNEE SURGERY Bilateral 1984   Right ACL, left PCL repair   LITHOTRIPSY  2005   LIVER BIOPSY  2013   normal results.   MASTECTOMY Bilateral    prior to 2009  MOUTH SURGERY     NASAL SEPTUM SURGERY N/A 09/20/2015   by ENT Dr. [name]   OVARIAN CYST SURGERY Left    size of grapefruit, was informed that she had shortened vagina   SHOULDER SURGERY Bilateral    Right 08/15/2016, Left 11/15/2016   THUMB ARTHROSCOPY Left    THYROIDECTOMY, PARTIAL Left 2008   TOTAL KNEE ARTHROPLASTY Right 08/23/2018   Procedure: TOTAL KNEE ARTHROPLASTY;  Surgeon: [name], MD;  Location: [facility];  Service: Orthopedics;  Laterality: Right;  23mn   TOTAL KNEE REVISION Left 02/06/2016   Procedure: LEFT TOTAL KNEE REVISION;  Surgeon: FGaynelle Arabian MD;  Location: [facility];  Service: Orthopedics;  Laterality: Left;   TOTAL KNEE REVISION Left 04/22/2017   Procedure: Left knee polyethylene revision;  Surgeon: AGaynelle Arabian MD;  Location: [facility];  Service: Orthopedics;  Laterality: Left;   UPPER GI ENDOSCOPY  2003   OB History   No obstetric history on file.    Home Medications    Prior to Admission medications   Medication Sig Start Date End Date Taking? Authorizing Provider  ciprofloxacin (CIPRO) 500 MG tablet Take 1 tablet (500 mg total) by mouth every 12 (twelve) hours for 7 days. 07/21/21 07/28/21 Yes MLynden OxfordScales, PA-C  Ciprofloxacin HCl 0.2 % otic solution Place 0.2 mLs into both ears 2 (two) times daily for 7 days. 07/21/21 07/28/21 Yes  MLynden OxfordScales, PA-C  acyclovir ointment (ZOVIRAX) 5 % as needed. 04/11/20   [provider]  baclofen (LIORESAL) 20 MG tablet Take 1 tablet (20 mg total) by mouth 5 (five) times daily. 1 tablet in morning, midday, dinner and 2 tablets at bedtime 06/27/21   TBayard Hugger NP  Calcipotriene-Betameth Diprop 0.005-0.064 % FOAM Apply 1 application. topically 2 (two) times daily as needed.    [provider]  Calcium Carbonate Antacid (TUMS ULTRA 1000 PO) Take by mouth daily. 05/06/21   [provider]  calcium elemental as carbonate (BARIATRIC TUMS ULTRA) 400 MG chewable tablet daily. 05/06/21   [provider]  ciprofloxacin-dexamethasone (CIPRODEX) OTIC suspension     [provider]  clindamycin (CLEOCIN) 300 MG capsule Take 300 mg by mouth 3 (three) times daily. 07/16/21   [provider]  Clobetasol Propionate Emulsion 0.05 % topical foam daily. 10/08/20   [provider]  clorazepate (TRANXENE) 15 MG tablet Take 15 mg by mouth at bedtime.    [provider]  clorazepate (TRANXENE) 7.5 MG tablet Take 7.5 mg by mouth 2 (two) times daily.    [provider]  COSENTYX SENSOREADY, 300 MG, 150 MG/ML SOAJ Inject 2 Syringes into the skin every 28 (twenty-eight) days. 03/16/20   [provider]  desonide (DESOWEN) 0.05 % cream Apply topically 2 (two) times daily. 10/25/20   [provider]  desvenlafaxine (PRISTIQ) 100 MG 24 hr tablet Take 100 mg by mouth daily.    [provider]  ergocalciferol (VITAMIN D2) 1.25 MG (50000 UT) capsule Take 1 capsule by mouth once a week. 02/09/20   [provider]  FSalemTake by mouth daily. 07/02/21   [provider]  folic acid (FOLVITE) 1 MG tablet Take 1 mg by mouth daily. 11/21/19   [provider]  furosemide (LASIX) 40 MG tablet Take 60 mg (one and a half tablets) once daily two days a week (the days you do not take 80  mg) 07/03/21   Croitoru, MDani Gobble MD  furosemide (LASIX) 80  MG tablet Take 80 mg once daily five days a week 07/03/21   Croitoru, Mihai, MD  halobetasol (ULTRAVATE) 0.05 % cream Apply topically. 09/25/20   [provider]  linaclotide Rolan Lipa) 290 MCG CAPS capsule Take 290 mcg by mouth daily before breakfast. 12/04/20   [provider]  methotrexate (RHEUMATREX) 2.5 MG tablet Take 15 mg by mouth once a week. Caution:Chemotherapy. Protect from light.    [provider]  metoprolol succinate (TOPROL-XL) 25 MG 24 hr tablet Take one tablet every evening at 10 pm. 04/04/21   Loel Dubonnet, NP  morphine (KADIAN) 20 MG 24 hr capsule SMARTSIG:1 Capsule(s) By Mouth Morning-Night 06/04/21   Bayard Hugger, NP  morphine (KADIAN) 30 MG 24 hr capsule SMARTSIG:1 Capsule(s) By Mouth Morning-Night 06/04/21   Bayard Hugger, NP  Multiple Vitamins-Minerals (ALIVE MULTI-VITAMIN) CHEW  05/06/21   [provider]  Oxycodone HCl 10 MG TABS Take 1 tablet (10 mg total) by mouth every 8 (eight) hours as needed (pain). 07/02/21   Bayard Hugger, NP  polyethylene glycol powder (GLYCOLAX/MIRALAX) 17 GM/SCOOP powder Purelax 17 gram/dose oral powder  TAKE 1 CAPFUL MIXED INTO 8 OZ OF CLEAR LIQUID UP TO FOUR TIMES A DAY AS NEEDED    [provider]  potassium chloride (KLOR-CON) 10 MEQ tablet Take by mouth. 08/06/20   [provider]  RESTASIS 0.05 % ophthalmic emulsion 1 drop 2 (two) times daily. 03/04/21   [provider]  testosterone cypionate (DEPOTESTOSTERONE CYPIONATE) 200 MG/ML injection INJECT 0.5 MLS (100 MG TOTAL) INTO THE MUSCLE ONCE A WEEK. 03/15/18   Shawnee Knapp, MD  ursodiol (ACTIGALL) 250 MG tablet Take 250 mg by mouth 2 (two) times daily with a meal.     [provider]  ursodiol (ACTIGALL) 500 MG tablet Take 1 tablet midday with lunch and 1 tablet in the evening with dinneer 05/23/20   Leonie Man, MD  XARELTO 20 MG TABS tablet TAKE 1 TABLET(20 MG)  BY MOUTH EVERY NIGHT AT BEDTIME 03/14/21   [name], MD   Family History Family History  Problem Relation Age of Onset   Stroke Maternal Grandfather        59   Heart attack Maternal Grandfather    Glaucoma Maternal Grandfather    Macular degeneration Maternal Grandfather    Breast cancer Sister    Hypertension Mother    Psoriasis Mother    Other Mother        meningioma developed ~2019   Glaucoma Mother    Cancer Paternal Grandfather    Heart attack Paternal Grandfather    Stroke Paternal Uncle        age 60   Polycythemia Paternal Uncle    Stroke Maternal Grandmother    Congestive Heart Failure Maternal Grandmother    Heart attack Maternal Grandmother    Protein C deficiency Sister 51       Miscarriages   Breast cancer Maternal Aunt 26   Social History Social History   Tobacco Use   Smoking status: Never   Smokeless tobacco: Never  Vaping Use   Vaping Use: Never used  Substance Use Topics   Alcohol use: Yes    Comment: social   Drug use: No   Allergies   Gabapentin, Penicillin g, Sulfa antibiotics, Nortriptyline, Pregabalin, Tegaderm ag mesh [silver], Ibuprofen, and Sulfacetamide sodium-sulfur  Review of Systems Review of Systems  HENT:  Positive for ear pain.   Pertinent findings noted in history of  present illness.   Physical Exam Triage Vital Signs ED Triage Vitals  Enc Vitals Group     BP 12/28/20 0827 (!) 147/82     Pulse Rate 12/28/20 0827 72     Resp 12/28/20 0827 18     Temp 12/28/20 0827 98.3 F (36.8 C)     Temp Source 12/28/20 0827 Oral     SpO2 12/28/20 0827 98 %     Weight --      Height --      Head Circumference --      Peak Flow --      Pain Score 12/28/20 0826 5     Pain Loc --      Pain Edu? --      Excl. in [place]? --   No data found.  Updated Vital Signs BP 115/72   Pulse 96   Temp 99.4 F (37.4 C) (Oral)   Resp 20   SpO2 98%   Physical Exam Vitals and nursing note reviewed.  Constitutional:      General: He is not in  acute distress.    Appearance: Normal appearance. He is not ill-appearing.  HENT:     Head: Normocephalic and atraumatic.     Salivary Glands: Right salivary gland is not diffusely enlarged or tender. Left salivary gland is not diffusely enlarged or tender.     Right Ear: Hearing normal. No decreased hearing noted. Drainage present. A middle ear effusion is present. No mastoid tenderness. Tympanic membrane is not erythematous or bulging.     Left Ear: Hearing normal. No decreased hearing noted. Drainage present. A middle ear effusion is present. No mastoid tenderness. Tympanic membrane is not erythematous or bulging.     Nose: Nose normal. No nasal deformity, septal deviation, mucosal edema, congestion or rhinorrhea.     Right Turbinates: Not enlarged, swollen or pale.     Left Turbinates: Not enlarged, swollen or pale.     Right Sinus: No maxillary sinus tenderness or frontal sinus tenderness.     Left Sinus: No maxillary sinus tenderness or frontal sinus tenderness.     Mouth/Throat:     Lips: Pink. No lesions.     Mouth: Mucous membranes are moist. No oral lesions.     Pharynx: Oropharynx is clear. Uvula midline. No posterior oropharyngeal erythema or uvula swelling.     Tonsils: No tonsillar exudate. 0 on the right. 0 on the left.  Eyes:     General: Lids are normal.        Right eye: No discharge.        Left eye: No discharge.     Extraocular Movements: Extraocular movements intact.     Conjunctiva/sclera: Conjunctivae normal.     Right eye: Right conjunctiva is not injected.     Left eye: Left conjunctiva is not injected.  Neck:     Trachea: Trachea and phonation normal.  Cardiovascular:     Rate and Rhythm: Normal rate and regular rhythm.     Pulses: Normal pulses.     Heart sounds: Normal heart sounds. No murmur heard.   No friction rub. No gallop.  Pulmonary:     Effort: Pulmonary effort is normal. No accessory muscle usage, prolonged expiration or respiratory distress.      Breath sounds: Normal breath sounds. No stridor, decreased air movement or transmitted upper airway sounds. No decreased breath sounds, wheezing, rhonchi or rales.  Chest:     Chest wall: No tenderness.  Musculoskeletal:  General: Normal range of motion.     Cervical back: Normal range of motion and neck supple. Normal range of motion.  Lymphadenopathy:     Cervical: No cervical adenopathy.  Skin:    General: Skin is warm and dry.     Findings: No erythema or rash.  Neurological:     General: No focal deficit present.     Mental Status: He is alert and oriented to person, place, and time.  Psychiatric:        Mood and Affect: Mood normal.        Behavior: Behavior normal.    Visual Acuity Right Eye Distance:   Left Eye Distance:   Bilateral Distance:    Right Eye Near:   Left Eye Near:    Bilateral Near:     UC Couse / Diagnostics / Procedures:    EKG  Radiology No results found.  Procedures Procedures (including critical care time)  UC Diagnoses / Final Clinical Impressions(s)   I have reviewed the triage vital signs and the nursing notes.  Pertinent labs & imaging results that were available during my care of the patient were reviewed by me and considered in my medical decision making (see chart for details).   Final diagnoses:  Otitis externa of both ears, unspecified chronicity, unspecified type   Pt advised to d/c all steroids in ears.  D/t pt being on clinda, being immunocompromised, will add oral cipro to otic cipro for best coverage. Pt given name of ENT that should be able to get them in quickly.  Return prec advised.   ED Prescriptions     Medication Sig Dispense Auth. Provider   ciprofloxacin (CIPRO) 500 MG tablet Take 1 tablet (500 mg total) by mouth every 12 (twelve) hours for 7 days. 14 tablet Lynden Oxford Scales, PA-C   Ciprofloxacin HCl 0.2 % otic solution Place 0.2 mLs into both ears 2 (two) times daily for 7 days. 1 each Lynden Oxford  Scales, PA-C      PDMP not reviewed this encounter.  Pending results:  Labs Reviewed - No data to display  Medications Ordered in UC: Medications - No data to display  Disposition Upon Discharge:  Condition: stable for discharge home Home: take medications as prescribed; routine discharge instructions as discussed; follow up as advised.  Patient presented with an acute illness with associated systemic symptoms and significant discomfort requiring urgent management. In my opinion, this is a condition that a prudent lay person (someone who possesses an average knowledge of health and medicine) may potentially expect to result in complications if not addressed urgently such as respiratory distress, impairment of bodily function or dysfunction of bodily organs.   Routine symptom specific, illness specific and/or disease specific instructions were discussed with the patient and/or caregiver at length.   As such, the patient has been evaluated and assessed, work-up was performed and treatment was provided in alignment with urgent care protocols and evidence based medicine.  Patient/parent/caregiver has been advised that the patient may require follow up for further testing and treatment if the symptoms continue in spite of treatment, as clinically indicated and appropriate.  If the patient was tested for COVID-19, Influenza and/or RSV, then the patient/parent/guardian was advised to isolate at home pending the results of his/her diagnostic coronavirus test and potentially longer if they're positive. I have also advised pt that if his/her COVID-19 test returns positive, it's recommended to self-isolate for at least 10 days after symptoms first appeared AND until fever-free  for 24 hours without fever reducer AND other symptoms have improved or resolved. Discussed self-isolation recommendations as well as instructions for household member/close contacts as per the [facility] and [place] DHHS, and also gave patient  the [place] packet with this information.  Patient/parent/caregiver has been advised to return to the [facility] or PCP in 3-5 days if no better; to PCP or the Emergency Department if new signs and symptoms develop, or if the current signs or symptoms continue to change or worsen for further workup, evaluation and treatment as clinically indicated and appropriate  The patient will follow up with their current PCP if and as advised. If the patient does not currently have a PCP we will assist them in obtaining one.   The patient may need specialty follow up if the symptoms continue, in spite of conservative treatment and management, for further workup, evaluation, consultation and treatment as clinically indicated and appropriate.  Patient/parent/caregiver verbalized understanding and agreement of plan as discussed.  All questions were addressed during visit.  Please see discharge instructions below for further details of plan.  Discharge Instructions:   Discharge Instructions      Please discontinue Ciprodex and fluocinolone drops at this time.  Please begin ciprofloxacin 4 drops in each ear twice daily.  Please allow the ciprofloxacin solution to remain in each ear for about 10 minutes before switching to instilling drops in the other ear.  I also provided with a prescription for ciprofloxacin for Pseudomonas coverage.  Please continue taking the clindamycin prescribed for your cellulitis.  I have enclosed the name and contact information of the ENT provider at [facility] health for your convenience.  I recommend that you reach out to them to see if you can appointment soon as possible.  Thank you for visiting urgent care today.    This office note has been dictated using Museum/gallery curator.  Unfortunately, and despite my best efforts, this method of dictation can sometimes lead to occasional typographical or grammatical errors.  I apologize in advance if this occurs.      Lynden Oxford Scales, PA-C 07/22/21 1248    Lynden Oxford Scales, PA-C 05/08/22 1501

## 2021-07-21 NOTE — Discharge Instructions (Signed)
Please discontinue Ciprodex and fluocinolone drops at this time.  Please begin ciprofloxacin 4 drops in each ear twice daily.  Please allow the ciprofloxacin solution to remain in each ear for about 10 minutes before switching to instilling drops in the other ear.  I also provided with a prescription for ciprofloxacin for Pseudomonas coverage.  Please continue taking the clindamycin prescribed for your cellulitis.  I have enclosed the name and contact information of the ENT provider at [facility] health for your convenience.  I recommend that you reach out to them to see if you can appointment soon as possible.  Thank you for visiting urgent care today.

## 2021-07-21 NOTE — ED Triage Notes (Signed)
Pt here with bilateral ear pain with psoriasis inside of the ear x 1 week. It began to get so bad that he is dizzy and lightheaded and nauseous and he has began to vomit. Has tried ciprodex without relief. Pt is immunocompromised and on clindamycin for spider bites.

## 2021-07-22 ENCOUNTER — Ambulatory Visit: Payer: BC Managed Care – PPO

## 2021-07-23 ENCOUNTER — Encounter: Payer: Self-pay | Admitting: Cardiovascular Disease

## 2021-07-25 ENCOUNTER — Encounter (INDEPENDENT_AMBULATORY_CARE_PROVIDER_SITE_OTHER): Payer: BC Managed Care – PPO | Admitting: Ophthalmology

## 2021-08-01 ENCOUNTER — Ambulatory Visit: Payer: BC Managed Care – PPO | Admitting: Registered Nurse

## 2021-08-02 ENCOUNTER — Encounter (INDEPENDENT_AMBULATORY_CARE_PROVIDER_SITE_OTHER): Payer: Self-pay | Admitting: Ophthalmology

## 2021-08-05 ENCOUNTER — Encounter (INDEPENDENT_AMBULATORY_CARE_PROVIDER_SITE_OTHER): Payer: Self-pay | Admitting: Ophthalmology

## 2021-08-06 ENCOUNTER — Encounter: Payer: 59 | Admitting: Registered Nurse

## 2021-08-12 ENCOUNTER — Other Ambulatory Visit (HOSPITAL_BASED_OUTPATIENT_CLINIC_OR_DEPARTMENT_OTHER): Payer: Self-pay

## 2021-08-12 ENCOUNTER — Encounter: Payer: 59 | Attending: Physical Medicine & Rehabilitation | Admitting: Registered Nurse

## 2021-08-12 ENCOUNTER — Encounter: Payer: Self-pay | Admitting: Cardiovascular Disease

## 2021-08-12 ENCOUNTER — Encounter: Payer: Self-pay | Admitting: Registered Nurse

## 2021-08-12 VITALS — BP 137/91 | HR 91 | Ht 69.0 in

## 2021-08-12 DIAGNOSIS — M7061 Trochanteric bursitis, right hip: Secondary | ICD-10-CM | POA: Diagnosis present

## 2021-08-12 DIAGNOSIS — G894 Chronic pain syndrome: Secondary | ICD-10-CM | POA: Diagnosis present

## 2021-08-12 DIAGNOSIS — Z79891 Long term (current) use of opiate analgesic: Secondary | ICD-10-CM | POA: Diagnosis present

## 2021-08-12 DIAGNOSIS — L4050 Arthropathic psoriasis, unspecified: Secondary | ICD-10-CM | POA: Diagnosis present

## 2021-08-12 DIAGNOSIS — Z5181 Encounter for therapeutic drug level monitoring: Secondary | ICD-10-CM | POA: Insufficient documentation

## 2021-08-12 DIAGNOSIS — M47816 Spondylosis without myelopathy or radiculopathy, lumbar region: Secondary | ICD-10-CM | POA: Insufficient documentation

## 2021-08-12 DIAGNOSIS — M7062 Trochanteric bursitis, left hip: Secondary | ICD-10-CM | POA: Diagnosis present

## 2021-08-12 DIAGNOSIS — G609 Hereditary and idiopathic neuropathy, unspecified: Secondary | ICD-10-CM | POA: Insufficient documentation

## 2021-08-12 DIAGNOSIS — M2550 Pain in unspecified joint: Secondary | ICD-10-CM | POA: Diagnosis present

## 2021-08-12 DIAGNOSIS — M5416 Radiculopathy, lumbar region: Secondary | ICD-10-CM | POA: Diagnosis present

## 2021-08-12 MED ORDER — MORPHINE SULFATE ER 20 MG PO CP24
ORAL_CAPSULE | ORAL | 0 refills | Status: DC
  Filled 2021-08-12: qty 60, 30d supply, fill #0

## 2021-08-12 MED ORDER — MORPHINE SULFATE ER 30 MG PO CP24
ORAL_CAPSULE | ORAL | 0 refills | Status: DC
  Filled 2021-08-12: qty 60, 30d supply, fill #0

## 2021-08-12 MED ORDER — MORPHINE SULFATE ER 20 MG PO CP24: ORAL_CAPSULE | ORAL | 0 refills | Status: DC

## 2021-08-12 MED ORDER — MORPHINE SULFATE ER 30 MG PO CP24: ORAL_CAPSULE | ORAL | 0 refills | Status: DC

## 2021-08-12 NOTE — Progress Notes (Signed)
Subjective:    Patient ID: Jesse Bowers, adult    DOB: October 21, 1965, 56 y.o.   MRN: [id number]  HPI: Jesse Bowers is a 56 y.o. choose not to disclose who returns for follow up appointment for chronic pain and medication refill. Marlyn states his pain is located in his neck,left elbow, right wrist, lower back pain radiating into his bilateral hips. He also reports he has a headache today and bilateral feet pain. He rates his pain 8. His current exercise regime is wall walking in his home he reports.  Mr. Skillen Morphine equivalent is 100.00 MME.He is also prescribed Clorazepate by Dr. Erling Cruz .We have discussed the black box warning of using opioids and benzodiazepines. I highlighted the dangers of using these drugs together and discussed the adverse events including respiratory suppression, overdose, cognitive impairment and importance of compliance with current regimen. We will continue to monitor and adjust as indicated.  he is being closely monitored and under the care of his psychiatrist.   Last Oral Swab was Performed on 04/03/2021, it was consistent.     Pain Inventory Average Pain 7 Pain Right Now 8 My pain is intermittent, sharp, burning, dull, tingling, and aching  LOCATION OF PAIN  head, neck, elbow, wrist, fingers, back, knee, toes, hip  BOWEL Number of stools per week: 0-3 Oral laxative use Yes  Type of laxative linzess changing movantik Enema or suppository use Yes  History of colostomy No  Incontinent No   BLADDER Normal In and out cath, frequency . Able to self cath  . Bladder incontinence No  Frequent urination No  Leakage with coughing No  Difficulty starting stream Yes  Incomplete bladder emptying No    Mobility walk with assistance how many minutes can you walk? 7-10 ability to climb steps?  yes do you drive?  yes  Function disabled: date disabled 06/18/2012 I need assistance with the following:  meal prep, household duties, and  shopping  Neuro/Psych weakness numbness tremor tingling trouble walking spasms dizziness anxiety loss of taste or smell  Prior Studies Any changes since last visit?  no  Physicians involved in your care Any changes since last visit?  no   Family History  Problem Relation Age of Onset   Stroke Maternal Grandfather        104   Heart attack Maternal Grandfather    Glaucoma Maternal Grandfather    Macular degeneration Maternal Grandfather    Breast cancer Sister    Hypertension Mother    Psoriasis Mother    Other Mother        meningioma developed ~2019   Glaucoma Mother    Cancer Paternal Grandfather    Heart attack Paternal Grandfather    Stroke Paternal Uncle        age 22   Polycythemia Paternal Uncle    Stroke Maternal Grandmother    Congestive Heart Failure Maternal Grandmother    Heart attack Maternal Grandmother    Protein C deficiency Sister 75       Miscarriages   Breast cancer Maternal Aunt 40   Social History   Socioeconomic History   Marital status: Married    Spouse name: Not on file   Number of children: 2   Years of education: 4y college   Highest education level: Not on file  Occupational History   Occupation: Pediatric Nurse practitioner    Comment: Not working since CVA 2015  Tobacco Use   Smoking status: Never   Smokeless tobacco: Never  Vaping Use   Vaping Use: Never used  Substance and Sexual Activity   Alcohol use: Yes    Comment: social   Drug use: No   Sexual activity: Yes    Birth control/protection: None    Comment: patient is a transgender on testosterone shots, no biological kids  Other Topics Concern   Not on file  Social History Narrative   Education 4 year college, former Therapist, sports X 15 years, pediatric nurse practitioner x 6 years, did NP degree from [employer] [place]. Relocated to [place] about 2 months ago from [place], [place]. Patient was in [place] for last 4 years and prior to that in [place]. His wife is working as  Scientist, research (physical sciences) for [employer]. Patient is not working and applying for disability. They have 2 kids but no biologic children.    Social Determinants of Health   Financial Resource Strain: Not on file  Food Insecurity: Not on file  Transportation Needs: Not on file  Physical Activity: Not on file  Stress: Not on file  Social Connections: Not on file   Past Surgical History:  Procedure Laterality Date   ABDOMINAL HYSTERECTOMY Bilateral 1994   TAH, BSO- tranverse incision at 56 yo   ANKLE ARTHROSCOPY WITH RECONSTRUCTION Right 2007   CHOLECYSTECTOMY     laparoscopic   COLONOSCOPY     x3   EYE SURGERY     Left eye 03/02/2018, right 02/15/2018   HIP ARTHROSCOPY W/ LABRAL REPAIR Right 05/11/2013   acetabular labral tear 03/30/2013   KNEE ARTHROPLASTY Right    KNEE JOINT MANIPULATION Left    x3 under anesthesia   KNEE SURGERY Bilateral 1984   Right ACL, left PCL repair   LITHOTRIPSY  2005   LIVER BIOPSY  2013   normal results.   MASTECTOMY Bilateral    prior to 2009   MOUTH SURGERY     NASAL SEPTUM SURGERY N/A 09/20/2015   by ENT Dr. [name]   OVARIAN CYST SURGERY Left    size of grapefruit, was informed that she had shortened vagina   SHOULDER SURGERY Bilateral    Right 08/15/2016, Left 11/15/2016   THUMB ARTHROSCOPY Left    THYROIDECTOMY, PARTIAL Left 2008   TOTAL KNEE ARTHROPLASTY Right 08/23/2018   Procedure: TOTAL KNEE ARTHROPLASTY;  Surgeon: [name], MD;  Location: [facility];  Service: Orthopedics;  Laterality: Right;  37mn   TOTAL KNEE REVISION Left 02/06/2016   Procedure: LEFT TOTAL KNEE REVISION;  Surgeon: FGaynelle Arabian MD;  Location: [facility];  Service: Orthopedics;  Laterality: Left;   TOTAL KNEE REVISION Left 04/22/2017   Procedure: Left knee polyethylene revision;  Surgeon: AGaynelle Arabian MD;  Location: [facility];  Service: Orthopedics;  Laterality: Left;   UPPER GI ENDOSCOPY  2003   Past Medical History:  Diagnosis Date   Abnormal weight loss    Anxiety     Arthritis    Cataract    OU   Celiac disease    Cervical neck pain with evidence of disc disease    patient has a cyst    Chronic constipation    Chronic diastolic heart failure (HCC)    Pt. denies   Chronic pain    Degenerative disc disease at L5-S1 level    with stenosis   DVT (deep venous thrombosis) (HCC)    Right upper arm, bilateral leg   Eczema    inguinal, feet   Elevated liver enzymes    Failed [place] knee [place] ([place] 04/22[place]  Family history of adverse reaction to anesthesia    family has problems with anesthesia of nausea and vomiting    GERD (gastroesophageal reflux disease)    History of in 20's   Gluten enteropathy    H/O parotitis    right    Hard of hearing    History of kidney stones    History of retinal tear    Bilateral   History of staph infection    required wound vac   Hx-TIA (transient ischemic attack)    2015   Kidney stones 08/2020   LVH (left ventricular hypertrophy) 12/15/2016   Mild, noted on ECHO   MVP (mitral valve prolapse)    NAFL (nonalcoholic fatty liver)    Neck pain    Neuromuscular disorder ([place])    bilateral neuropathy feet.   Nuclear sclerotic cataract of both eyes 09/08/2019   Pneumonia 12/17/2010   Polycythemia    Polycythemia, secondary    PONV (postoperative nausea and vomiting)    Protein C deficiency (HCC)    Dr. Anne Fu   Psoriasis    16 X10 cm psoriatic rash on sole of left foot ; open and occ scant bleeding;    psoriatic arthritis    PTSD (post-traumatic stress disorder)    Scaphoid fracture of wrist 09/23/2013   Seizure ([place])    childhood, medication until age 1 then weanned completely off   Sleep apnea    split night study last done by Dr. [name] 06/18/15 shows severe OSA, CSA, and hypersomnia, rec bipap   Splenomegaly    Stenosis of ureteropelvic junction (UPJ)    left   Stroke [facility])    CVA vs TIA in left cerebrum causing slight right sided weakness-Dr. 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       Spasms Hips, knee, wrist, fingers, toes, head pain  Neurological:  Positive for dizziness, tremors, weakness and numbness.  Psychiatric/Behavioral:  The patient is nervous/anxious.   All other systems reviewed and are negative.     Objective:   Physical Exam Vitals and nursing note reviewed.  Constitutional:      Appearance: Normal appearance.  Neck:     Comments: Cervical Paraspinal Tenderness: C-5-C-6 Cardiovascular:     Rate and Rhythm: Normal rate and regular rhythm.     Pulses: Normal pulses.  Heart sounds: Normal heart sounds.  Pulmonary:     Effort: Pulmonary effort is normal.     Breath sounds: Normal breath sounds.  Musculoskeletal:     Cervical back: Normal range of motion and neck supple.     Comments: Normal Muscle Bulk and Muscle Testing Reveals:  Upper Extremities: Decreased ROM and Muscle Strength  on Right 3/5 and Left 4/5 Bilateral AC Joint Tenderness Bilateral Greater Trochanter Tenderness R>L Lower Extremities: Decreased ROM and Muscle Strength 3/5 Arrived in wheelchair     Skin:    General: Skin is warm and dry.  Neurological:     Mental Status: He is alert and oriented to  person, place, and time.  Psychiatric:        Mood and Affect: Mood normal.        Behavior: Behavior normal.         Assessment & Plan:  1. Psoriatic arthritis with pain in multiple areas, most prominently feet, hands, elbows. Refilled: Kadian  30 mg 24 hr. Capsule, one capsule every 12 hours #60,  and Kadian 20 mg 24 hr capsule every 12 hours #60 and  Continue  Oxycodone 10 mg one tablet every 8 hours as needed #70. Second scripts sent for the following month. 08/12/2021. We will continue the opioid monitoring program, this consists of regular clinic visits, examinations, urine drug screen, pill counts as well as use of [place] Controlled Substance Reporting system. A 12 month History has been reviewed on the [place] Controlled Substance Reporting System 08/12/2021. [name] Rheumatology Following.  2. Chronic Pain Syndrome: Continue Compound Cream. 08/12/2021 3. 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 08/12/2021 4. 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 08/12/2021. S/P TKR on 07/03/14:  Ortho Following: Dr. Wynelle Link perform Total Left Knee Revision on 02/06/2016. S/P Left Knee Revision on 04/22/17. S/P  Right TKA on 08/23/2018 with Dr. Wynelle Link. 5. Chronic mid- low back pain: Continue current medication regime, and encourage to increase activity as tolerated. 08/12/2021 6. Polycythemia: Oncology Following. 08/12/2021 7. Depression with anxiety : Psychiatry following.Continue to Monitor. 08/12/2021 8. Cervicalgia: Continue HEP as tolerated  and Continue to Monitor. Continue current medication regime. 08/12/2021 9. Muscle Spasticity: Continue Baclofen 08/12/2021 10. Right Ankle Pain: No complaints Today. Continue to Monitor. 08/12/2021 10. Left lateral epicondylitis:Ortho Following. 08/12/2021 11. Bilateral  Shoulder Pain: Continue HEP as Tolerated, Continue to Monitor. Ortho Following.08/12/2021 12. Lumbar Radiculitis: S/P  Left L5-S1 Translaminar  Injection on 07/09/2018. We will continue to Monitor. 08/12/2021  13.Bilateral Greater Trochanteric Bursitis: Continue with Ice and Heat Therapy: Contiue HEP as Tolerated. Continue to Monitor. 08/12/2021 14. Fall at [facility] in February. No Falls since last visit. Orthopedist Following. Educated on falls prevention. He verbalizes understanding.  Continue to Monitor. 08/12/2021     F/U in 2 months

## 2021-08-13 ENCOUNTER — Other Ambulatory Visit (HOSPITAL_BASED_OUTPATIENT_CLINIC_OR_DEPARTMENT_OTHER): Payer: Self-pay

## 2021-08-15 ENCOUNTER — Other Ambulatory Visit (HOSPITAL_BASED_OUTPATIENT_CLINIC_OR_DEPARTMENT_OTHER): Payer: Self-pay

## 2021-08-15 ENCOUNTER — Encounter (HOSPITAL_BASED_OUTPATIENT_CLINIC_OR_DEPARTMENT_OTHER): Payer: Self-pay

## 2021-08-15 MED ORDER — FOLIC ACID 1 MG PO TABS
ORAL_TABLET | ORAL | 1 refills | Status: DC
  Filled 2021-08-28: qty 90, 90d supply, fill #0

## 2021-08-15 MED ORDER — URSODIOL 250 MG PO TABS
ORAL_TABLET | ORAL | 7 refills | Status: DC
  Filled 2021-08-30: qty 180, 90d supply, fill #0
  Filled 2021-11-22: qty 120, 60d supply, fill #1

## 2021-08-15 MED ORDER — LINACLOTIDE 290 MCG PO CAPS
ORAL_CAPSULE | ORAL | 8 refills | Status: DC
  Filled 2021-09-24: qty 90, 90d supply, fill #0

## 2021-08-15 MED ORDER — URSODIOL 500 MG PO TABS
ORAL_TABLET | ORAL | 11 refills | Status: DC
  Filled 2021-08-30: qty 180, 90d supply, fill #0
  Filled 2021-11-22: qty 180, 90d supply, fill #1
  Filled 2022-01-25 – 2022-02-21 (×3): qty 180, 90d supply, fill #2

## 2021-08-15 MED ORDER — POTASSIUM CHLORIDE ER 10 MEQ PO TBCR
EXTENDED_RELEASE_TABLET | ORAL | 3 refills | Status: DC
  Filled 2021-08-30: qty 180, 90d supply, fill #0

## 2021-08-15 MED ORDER — MOVANTIK 25 MG PO TABS
25.0000 mg | ORAL_TABLET | Freq: Every day | ORAL | 2 refills | Status: DC
  Filled 2021-08-15: qty 30, 30d supply, fill #0
  Filled 2021-08-30 – 2021-09-11 (×2): qty 30, 30d supply, fill #1
  Filled 2021-10-08: qty 30, 30d supply, fill #2

## 2021-08-15 MED ORDER — HALOBETASOL PROPIONATE 0.05 % EX CREA
TOPICAL_CREAM | CUTANEOUS | 1 refills | Status: DC
  Filled 2021-08-15: qty 50, 14d supply, fill #0
  Filled 2021-08-28: qty 50, 7d supply, fill #0

## 2021-08-15 MED ORDER — METOPROLOL SUCCINATE ER 25 MG PO TB24: ORAL_TABLET | ORAL | 3 refills | Status: DC

## 2021-08-16 ENCOUNTER — Other Ambulatory Visit: Payer: Self-pay | Admitting: Hematology

## 2021-08-16 ENCOUNTER — Ambulatory Visit (INDEPENDENT_AMBULATORY_CARE_PROVIDER_SITE_OTHER): Payer: 59 | Admitting: Psychologist

## 2021-08-16 ENCOUNTER — Other Ambulatory Visit (HOSPITAL_BASED_OUTPATIENT_CLINIC_OR_DEPARTMENT_OTHER): Payer: Self-pay

## 2021-08-16 DIAGNOSIS — F331 Major depressive disorder, recurrent, moderate: Secondary | ICD-10-CM

## 2021-08-16 DIAGNOSIS — D751 Secondary polycythemia: Secondary | ICD-10-CM

## 2021-08-16 DIAGNOSIS — F411 Generalized anxiety disorder: Secondary | ICD-10-CM | POA: Diagnosis not present

## 2021-08-16 MED ORDER — XARELTO 20 MG PO TABS
ORAL_TABLET | ORAL | 0 refills | Status: DC
Start: 2021-08-16 — End: 2021-11-18
  Filled 2021-08-16: qty 90, 90d supply, fill #0
  Filled 2021-08-30: qty 30, 30d supply, fill #0
  Filled 2021-09-20: qty 90, 90d supply, fill #0

## 2021-08-16 NOTE — Telephone Encounter (Signed)
Pt switched care to [place]

## 2021-08-16 NOTE — Progress Notes (Signed)
[facility] Counselor/Therapist Progress Note  Patient ID: Jesse Bowers, MRN: [id number],    Date: 08/16/2021  Time Spent: 1:02 pm to 1:44 pm; total time: 42 minutes   This session was held via video webex teletherapy due to the coronavirus risk at this time. The patient consented to video teletherapy and was located at his home during this session. He is aware it is the responsibility of the patient to secure confidentiality on his end of the session. The provider was in a private home office for the duration of this session. Limits of confidentiality were discussed with the patient.   Treatment Type: Individual Therapy  Reported Symptoms: Doing better  Mental Status Exam: Appearance:  Casual     Behavior: Appropriate  Motor: Normal  Speech/Language:  Normal Rate  Affect: Appropriate  Mood: normal  Thought process: normal  Thought content:   WNL  Sensory/Perceptual disturbances:   WNL  Orientation: oriented to person, place, and time/date  Attention: Good  Concentration: Good  Memory: WNL  Fund of knowledge:  Good  Insight:   Fair  Judgment:  Fair  Impulse Control: Good   Risk Assessment: Danger to Self:  No Self-injurious Behavior: No Danger to Others: No Duty to Warn:no Physical Aggression / Violence:No  Access to Firearms a concern: No  Gang Involvement:No   Subjective: Patient began the session the patient described himself as doing well indicating that he and Tess are becoming more connected and that they are working on becoming sexually active with each other again. Patient spent the session reflecting on this theme. He asked to follow up. He denied suicidal and homicidal ideation.    Interventions:  Worked on developing a therapeutic relationship with the patient. Used active listening and reflective statements. Provided emotional support using empathy and validation. Used summary statements. Praised patient for doing well and explored what has  assisted the patient. Praised the patient for vulnerability. Used socratic questions to assist the patient. Provided psychoeducation about sensate focus, five love languages, and podcast episode. Assisted in problem solving. Explored what does it mean to be emotionally connected. Assigned homework. Provided empathic statements. Assessed for suicidal and homicidal ideation.  Homework: Continue to work towards connecting with wife. Listen to podcast episode, review handout, take survey  Next Session: Emotional support, and review homework.   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 disorder, recurrent, moderate   Plan:   Goals Alleviate depressive symptoms Recognize, accept, and cope with depressive feelings Develop healthy thinking patterns Develop healthy interpersonal relationships Reduce overall frequency, intensity, and duration of anxiety Stabilize anxiety level wile increasing ability to function Enhance ability to effectively cope with full variety of stressors Learn and implement coping skills that result in a reduction of anxiety   Objectives target date for all objectives is: 09/26/2021 Verbalize an understanding of the cognitive, physiological, and behavioral components of anxiety Learning and implement calming skills to reduce overall anxiety Verbalize an understanding of the role that cognitive biases play in excessive irrational worry and persistent anxiety symptoms Identify, challenge, and replace based fearful talk Learn and implement problem solving strategies Identify and engage in pleasant activities Learning and implement personal and interpersonal skills to reduce anxiety and improve interpersonal relationships Learn to accept limitations in life and commit to tolerating, rather than avoiding, unpleasant emotions while accomplishing meaningful goals Identify major life conflicts from the past and present that form the basis for  present anxiety Maintain involvement in work, family, and social activities  Reestablish a consistent sleep-wake cycle Cooperate with a medical evaluation  Cooperate with a medication evaluation by a physician Verbalize an accurate understanding of depression Verbalize an understanding of the treatment Identify and replace thoughts that support depression Learn and implement behavioral strategies Verbalize an understanding and resolution of current interpersonal problems Learn and implement problem solving and decision making skills Learn and implement conflict resolution skills to resolve interpersonal problems Verbalize an understanding of healthy and unhealthy emotions verbalize insight into how past relationships may be influence current experiences with depression Use mindfulness and acceptance strategies and increase value based behavior  Increase hopeful statements about the future.  Interventions Engage the patient in behavioral activation Use instruction, modeling, and role-playing to build the client's general social, communication, and/or conflict resolution skills Use Acceptance and Commitment Therapy to help client accept uncomfortable realities in order to accomplish value-consistent goals Reinforce the client's insight into the role of his/her past emotional pain and present anxiety  Support the client in following through with work, family, and social activities Teach and implement sleep hygiene practices  Refer the patient to a physician for a psychotropic medication consultation Monito the clint's psychotropic medication compliance Discuss how anxiety typically involves excessive worry, various bodily expressions of tension, and avoidance of what is threatening that interact to maintain the problem  Teach the patient relaxation skills Assign the patient homework Discuss examples demonstrating that unrealistic worry overestimates the probability of threats and  underestimates patient's ability  Assist the patient in analyzing his or her worries Help patient understand that avoidance is reinforcing  Consistent with treatment model, discuss how change in cognitive, behavioral, and interpersonal can help client alleviate depression CBT Behavioral activation help the client explore the relationship, nature of the dispute,  Help the client develop new interpersonal skills and relationships Conduct Problem solving therapy Teach conflict resolution skills Use a process-experiential approach Conduct TLDP Conduct ACT Evaluate need for psychotropic medication Monitor adherence to medication   Patient agreed and reviewed the treatment plan on 10/03/2020  Conception Chancy, PsyD

## 2021-08-19 ENCOUNTER — Other Ambulatory Visit (HOSPITAL_BASED_OUTPATIENT_CLINIC_OR_DEPARTMENT_OTHER): Payer: Self-pay

## 2021-08-19 ENCOUNTER — Telehealth: Payer: Self-pay

## 2021-08-19 MED ORDER — OXYCODONE-ACETAMINOPHEN 5-325 MG PO TABS
ORAL_TABLET | ORAL | 0 refills | Status: DC
  Filled 2021-08-19: qty 28, 7d supply, fill #0

## 2021-08-19 NOTE — Telephone Encounter (Signed)
PA for Morphine ER 20 mg sent to insurance through [facility]

## 2021-08-19 NOTE — Telephone Encounter (Signed)
Patient called stating he had surgery today and a prescription for Percocet was written for him but he is not going to pick it up. He states he will just take Oxycodone for breakthrough pain.

## 2021-08-20 ENCOUNTER — Encounter: Payer: Self-pay | Admitting: Physical Medicine & Rehabilitation

## 2021-08-22 ENCOUNTER — Other Ambulatory Visit (HOSPITAL_BASED_OUTPATIENT_CLINIC_OR_DEPARTMENT_OTHER): Payer: Self-pay

## 2021-08-22 MED ORDER — KETOROLAC TROMETHAMINE 10 MG PO TABS
ORAL_TABLET | ORAL | 0 refills | Status: DC
  Filled 2021-08-22: qty 20, 5d supply, fill #0

## 2021-08-23 ENCOUNTER — Encounter: Payer: Self-pay | Admitting: Cardiovascular Disease

## 2021-08-23 ENCOUNTER — Other Ambulatory Visit (HOSPITAL_BASED_OUTPATIENT_CLINIC_OR_DEPARTMENT_OTHER): Payer: Self-pay

## 2021-08-23 ENCOUNTER — Encounter (HOSPITAL_BASED_OUTPATIENT_CLINIC_OR_DEPARTMENT_OTHER): Payer: Self-pay

## 2021-08-23 MED ORDER — FUROSEMIDE 80 MG PO TABS
ORAL_TABLET | ORAL | 3 refills | Status: DC
  Filled 2021-08-23: qty 60, 84d supply, fill #0

## 2021-08-26 ENCOUNTER — Other Ambulatory Visit (HOSPITAL_BASED_OUTPATIENT_CLINIC_OR_DEPARTMENT_OTHER): Payer: Self-pay

## 2021-08-26 NOTE — Telephone Encounter (Signed)
Patient's insurance denied Morphine sulfate 20 mg because it is excluded from the patient's pharmacy benefits. Full denial scanned in media

## 2021-08-27 ENCOUNTER — Other Ambulatory Visit (HOSPITAL_BASED_OUTPATIENT_CLINIC_OR_DEPARTMENT_OTHER): Payer: Self-pay

## 2021-08-28 ENCOUNTER — Encounter: Payer: Self-pay | Admitting: Physical Medicine & Rehabilitation

## 2021-08-28 ENCOUNTER — Other Ambulatory Visit (HOSPITAL_COMMUNITY): Payer: Self-pay

## 2021-08-28 ENCOUNTER — Other Ambulatory Visit (HOSPITAL_BASED_OUTPATIENT_CLINIC_OR_DEPARTMENT_OTHER): Payer: Self-pay

## 2021-08-28 MED ORDER — DESONIDE 0.05 % EX CREA
TOPICAL_CREAM | CUTANEOUS | 2 refills | Status: DC
  Filled 2021-08-28: qty 60, 14d supply, fill #0
  Filled 2021-10-02: qty 60, 30d supply, fill #0
  Filled 2021-10-31: qty 60, 30d supply, fill #1
  Filled 2021-11-26: qty 60, 30d supply, fill #2

## 2021-08-28 MED ORDER — CLOBETASOL PROPIONATE EMULSION 0.05 % EX FOAM
CUTANEOUS | 5 refills | Status: DC
Start: 2021-08-28 — End: 2021-12-06
  Filled 2021-08-28: qty 50, 14d supply, fill #0
  Filled 2021-11-05: qty 100, 14d supply, fill #0

## 2021-08-28 MED ORDER — IBSRELA 50 MG PO TABS
ORAL_TABLET | ORAL | 1 refills | Status: DC
  Filled 2021-08-28 – 2021-10-02 (×2): qty 180, 90d supply, fill #0
  Filled 2021-11-18 – 2022-01-04 (×2): qty 180, 90d supply, fill #1

## 2021-08-28 MED ORDER — METHOTREXATE 2.5 MG PO TABS
ORAL_TABLET | ORAL | 0 refills | Status: DC
  Filled 2021-08-28: qty 48, 56d supply, fill #0

## 2021-08-29 NOTE — Telephone Encounter (Signed)
OK. Furosemide 80 mg daily and prn additional 20 mg for fluid gain

## 2021-08-30 ENCOUNTER — Other Ambulatory Visit (HOSPITAL_BASED_OUTPATIENT_CLINIC_OR_DEPARTMENT_OTHER): Payer: Self-pay

## 2021-08-30 ENCOUNTER — Encounter (HOSPITAL_BASED_OUTPATIENT_CLINIC_OR_DEPARTMENT_OTHER): Payer: Self-pay

## 2021-08-30 MED ORDER — FUROSEMIDE 20 MG PO TABS
20.0000 mg | ORAL_TABLET | Freq: Every day | ORAL | 1 refills | Status: DC | PRN
  Filled 2021-08-30: qty 30, 30d supply, fill #0
  Filled 2021-09-10 – 2021-11-24 (×2): qty 30, 30d supply, fill #1

## 2021-08-30 MED ORDER — FUROSEMIDE 80 MG PO TABS
80.0000 mg | ORAL_TABLET | Freq: Every day | ORAL | 3 refills | Status: DC
  Filled 2021-08-30 – 2021-10-15 (×5): qty 90, 90d supply, fill #0
  Filled 2021-11-26 – 2022-01-07 (×2): qty 90, 90d supply, fill #1
  Filled 2022-04-04: qty 90, 90d supply, fill #2
  Filled 2022-07-08: qty 90, 90d supply, fill #3

## 2021-09-02 ENCOUNTER — Other Ambulatory Visit (HOSPITAL_BASED_OUTPATIENT_CLINIC_OR_DEPARTMENT_OTHER): Payer: Self-pay

## 2021-09-04 ENCOUNTER — Telehealth: Payer: Self-pay | Admitting: Pharmacist

## 2021-09-04 ENCOUNTER — Other Ambulatory Visit (HOSPITAL_BASED_OUTPATIENT_CLINIC_OR_DEPARTMENT_OTHER): Payer: Self-pay

## 2021-09-04 ENCOUNTER — Ambulatory Visit: Payer: 59 | Attending: Internal Medicine | Admitting: Pharmacist

## 2021-09-04 ENCOUNTER — Other Ambulatory Visit (HOSPITAL_COMMUNITY): Payer: Self-pay

## 2021-09-04 DIAGNOSIS — Z79899 Other long term (current) drug therapy: Secondary | ICD-10-CM

## 2021-09-04 MED ORDER — COSENTYX SENSOREADY PEN 150 MG/ML ~~LOC~~ SOAJ: SUBCUTANEOUS | 9 refills | Status: DC

## 2021-09-04 MED ORDER — COSENTYX SENSOREADY PEN 150 MG/ML ~~LOC~~ SOAJ
SUBCUTANEOUS | 9 refills | Status: DC
  Filled 2021-09-04 – 2021-09-10 (×2): qty 2, fill #0
  Filled 2021-09-11: qty 2, 28d supply, fill #0
  Filled 2021-11-05: qty 2, 28d supply, fill #1
  Filled 2021-12-05: qty 2, 28d supply, fill #2
  Filled 2022-01-02: qty 2, 28d supply, fill #3
  Filled 2022-02-03 – 2022-02-07 (×2): qty 2, 28d supply, fill #4
  Filled 2022-03-05 (×2): qty 2, 28d supply, fill #5
  Filled 2022-04-01: qty 2, 28d supply, fill #6
  Filled 2022-04-30 – 2022-05-01 (×2): qty 2, 28d supply, fill #7
  Filled 2022-05-29: qty 2, 28d supply, fill #8
  Filled 2022-06-24: qty 2, 28d supply, fill #9

## 2021-09-04 NOTE — Progress Notes (Signed)
   S: Patient presents today for review of their specialty medication.   Patient is currently taking Cosentyx (secukinumab) for psoriatic arthritis. Patient is managed by Dr. Melina Copa for this.   Dosing: Psoriatic arthritis: SubQ: 300 mg every 4 weeks in patients who continue to have active psoriatic arthritis.  Adherence: confirms  Efficacy: reports good control with the medication. Will occasionally require PO steroid bursts for flares but has not required this in some time.   Current adverse effects: S/sx of infection: none  GI upset: none  Headache: none  S/sx of hypersensitivity: none     O:     Lab Results  Component Value Date   WBC 4.6 02/18/2021   HGB 10.2 (L) 02/18/2021   HCT 32.4 (L) 02/18/2021   MCV 81 02/18/2021   PLT 380 02/18/2021      Chemistry      Component Value Date/Time   NA 138 02/18/2021 1427   NA 138 01/07/2017 1243   K 4.7 02/18/2021 1427   K 4.8 01/07/2017 1243   CL 102 02/18/2021 1427   CO2 20 02/18/2021 1427   CO2 25 01/07/2017 1243   BUN 11 02/18/2021 1427   BUN 18.2 01/07/2017 1243   CREATININE 1.09 02/18/2021 1427   CREATININE 1.1 01/07/2017 1243      Component Value Date/Time   CALCIUM 9.2 02/18/2021 1427   CALCIUM 9.2 01/07/2017 1243   ALKPHOS 253 (H) 08/24/2020 1240   ALKPHOS 453 (H) 01/07/2017 1243   AST 32 08/24/2020 1240   AST 57 (H) 01/07/2017 1243   ALT 29 08/24/2020 1240   ALT 78 (H) 01/07/2017 1243   BILITOT 0.7 08/24/2020 1240   BILITOT 0.4 04/29/2018 1716   BILITOT 0.58 01/07/2017 1243       A/P: 1. Medication review: Patient is currently on Cosentyx for psoriatic arthritis and is tolerating it well. Reviewed the medication with the patient, including the following: Cosentyx is a monoclonal antibody used in the treatment of ankylosing spondylitis, psoriasis, and psoriatic arthritis. The injection is subq and the medication should be allowed to reach room temp prior to injecting. Injection sites should be  rotated. Possible adverse effects include headaches, GI upset, increased risk of infection and hypersensitivity reactions. No recommendations for any changes at this time.   Benard Halsted, PharmD, [place], [place] [phone]

## 2021-09-04 NOTE — Telephone Encounter (Signed)
Called patient to schedule an appointment for the [employer] [facility]. I was unable to reach the patient so I left a HIPAA-compliant message requesting that the patient return my call.   Luke Van Ausdall, PharmD, BCACP, CPP Clinical Pharmacist [facility] [phone]  

## 2021-09-05 ENCOUNTER — Telehealth: Payer: Self-pay | Admitting: Registered Nurse

## 2021-09-05 ENCOUNTER — Other Ambulatory Visit (HOSPITAL_BASED_OUTPATIENT_CLINIC_OR_DEPARTMENT_OTHER): Payer: Self-pay

## 2021-09-05 MED ORDER — MORPHINE SULFATE ER 15 MG PO TBCR
15.0000 mg | EXTENDED_RELEASE_TABLET | Freq: Two times a day (BID) | ORAL | 0 refills | Status: DC
  Filled 2021-09-05: qty 60, 30d supply, fill #0

## 2021-09-05 MED ORDER — MORPHINE SULFATE ER 30 MG PO TBCR
30.0000 mg | EXTENDED_RELEASE_TABLET | Freq: Two times a day (BID) | ORAL | 0 refills | Status: DC
  Filled 2021-09-05: qty 60, 30d supply, fill #0

## 2021-09-05 NOTE — Telephone Encounter (Signed)
Placed a call to [place] 20 mg cash price : $239.15 Kadian 30 mg cash Price $259.98  MS Contin 30 mg $ 25. 02 MS Contin 15 mg $ 9.69 The above will be discussed with Jesse Bowers.

## 2021-09-05 NOTE — Telephone Encounter (Signed)
Kadian not covered under [employer]. The above was discussed with Dr [name].  Kadian change to MS Contin 30 mg + MS Contin 15 mg every 12 hours. [place] is aware of the above.

## 2021-09-05 NOTE — Telephone Encounter (Signed)
Per the pharmacy, the insurance will not cover the Kadian 20 mg or 30 mg. Zella Ball spoke with Dr. [name]. See previous encounter

## 2021-09-06 ENCOUNTER — Ambulatory Visit (INDEPENDENT_AMBULATORY_CARE_PROVIDER_SITE_OTHER): Payer: 59 | Admitting: Psychologist

## 2021-09-06 ENCOUNTER — Telehealth: Payer: Self-pay

## 2021-09-06 ENCOUNTER — Other Ambulatory Visit (HOSPITAL_COMMUNITY): Payer: Self-pay

## 2021-09-06 ENCOUNTER — Encounter: Payer: Self-pay | Admitting: Cardiovascular Disease

## 2021-09-06 DIAGNOSIS — F411 Generalized anxiety disorder: Secondary | ICD-10-CM

## 2021-09-06 DIAGNOSIS — F331 Major depressive disorder, recurrent, moderate: Secondary | ICD-10-CM | POA: Diagnosis not present

## 2021-09-06 NOTE — Telephone Encounter (Signed)
It is okay to take the metoprolol as needed.  If he is not going to take it as a daily medication, the metoprolol tartrate will work better than the succinate since it has quicker action onset

## 2021-09-06 NOTE — Telephone Encounter (Signed)
PA for Morphine Sulfate tablets sent to insurance through [facility]

## 2021-09-06 NOTE — Progress Notes (Signed)
[place] Counselor/Therapist Progress Note  Patient ID: Jesse Bowers, MRN: [id number],    Date: 09/06/2021  Time Spent: 1:03 pm to 1:45 pm; total time: 42 minutes   This session was held via video webex teletherapy due to the coronavirus risk at this time. The patient consented to video teletherapy and was located at his home during this session. He is aware it is the responsibility of the patient to secure confidentiality on his end of the session. The provider was in a private home office for the duration of this session. Limits of confidentiality were discussed with the patient.   Treatment Type: Individual Therapy  Reported Symptoms: Experiencing depression  Mental Status Exam: Appearance:  Casual     Behavior: Appropriate  Motor: Normal  Speech/Language:  Normal Rate  Affect: Appropriate  Mood: normal  Thought process: normal  Thought content:   WNL  Sensory/Perceptual disturbances:   WNL  Orientation: oriented to person, place, and time/date  Attention: Good  Concentration: Good  Memory: WNL  Fund of knowledge:  Good  Insight:   Fair  Judgment:  Fair  Impulse Control: Good   Risk Assessment: Danger to Self:  No Self-injurious Behavior: No Danger to Others: No Duty to Warn:no Physical Aggression / Violence:No  Access to Firearms a concern: No  Gang Involvement:No   Subjective: Patient began the session the patient described himself as okay. He voiced that Jesse Bowers has has been hesitant to participate in sensate focus or on improving their relationship. He briefly explored factors that may contribute. From there, he talked about experiencing more depression. Patient was open to the idea of exploring how to increase socialization in life. He asked to follow up. He denied suicidal and homicidal ideation.    Interventions:  Worked on developing a therapeutic relationship with the patient. Used active listening and reflective statements. Provided emotional  support using empathy and validation. Reviewed events since the last session. Normalized and validated expressed frustrations that patient expressed. Explored the etiology behind some of the hesitation to participate in sensate focus. Attempted to assist with problem solving. Explored the depression that patient was expressing. Explored the etiology of depression. Processed the idea of looking for social support outside of therapy. Provided empathic statements. Assessed for suicidal and homicidal ideation.  Homework: NA  Next Session: Emotional support, and discuss how to get socialization needs met  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 disorder, recurrent, moderate   Plan:   Goals Alleviate depressive symptoms Recognize, accept, and cope with depressive feelings Develop healthy thinking patterns Develop healthy interpersonal relationships Reduce overall frequency, intensity, and duration of anxiety Stabilize anxiety level wile increasing ability to function Enhance ability to effectively cope with full variety of stressors Learn and implement coping skills that result in a reduction of anxiety   Objectives target date for all objectives is: 09/26/2021 Verbalize an understanding of the cognitive, physiological, and behavioral components of anxiety Learning and implement calming skills to reduce overall anxiety Verbalize an understanding of the role that cognitive biases play in excessive irrational worry and persistent anxiety symptoms Identify, challenge, and replace based fearful talk Learn and implement problem solving strategies Identify and engage in pleasant activities Learning and implement personal and interpersonal skills to reduce anxiety and improve interpersonal relationships Learn to accept limitations in life and commit to tolerating, rather than avoiding, unpleasant emotions while accomplishing meaningful goals Identify major  life conflicts from the past and present that form the basis for present  anxiety Maintain involvement in work, family, and social activities Reestablish a consistent sleep-wake cycle Cooperate with a medical evaluation  Cooperate with a medication evaluation by a physician Verbalize an accurate understanding of depression Verbalize an understanding of the treatment Identify and replace thoughts that support depression Learn and implement behavioral strategies Verbalize an understanding and resolution of current interpersonal problems Learn and implement problem solving and decision making skills Learn and implement conflict resolution skills to resolve interpersonal problems Verbalize an understanding of healthy and unhealthy emotions verbalize insight into how past relationships may be influence current experiences with depression Use mindfulness and acceptance strategies and increase value based behavior  Increase hopeful statements about the future.  Interventions Engage the patient in behavioral activation Use instruction, modeling, and role-playing to build the client's general social, communication, and/or conflict resolution skills Use Acceptance and Commitment Therapy to help client accept uncomfortable realities in order to accomplish value-consistent goals Reinforce the client's insight into the role of his/her past emotional pain and present anxiety  Support the client in following through with work, family, and social activities Teach and implement sleep hygiene practices  Refer the patient to a physician for a psychotropic medication consultation Monito the clint's psychotropic medication compliance Discuss how anxiety typically involves excessive worry, various bodily expressions of tension, and avoidance of what is threatening that interact to maintain the problem  Teach the patient relaxation skills Assign the patient homework Discuss examples demonstrating that unrealistic  worry overestimates the probability of threats and underestimates patient's ability  Assist the patient in analyzing his or her worries Help patient understand that avoidance is reinforcing  Consistent with treatment model, discuss how change in cognitive, behavioral, and interpersonal can help client alleviate depression CBT Behavioral activation help the client explore the relationship, nature of the dispute,  Help the client develop new interpersonal skills and relationships Conduct Problem solving therapy Teach conflict resolution skills Use a process-experiential approach Conduct TLDP Conduct ACT Evaluate need for psychotropic medication Monitor adherence to medication   Patient agreed and reviewed the treatment plan on 10/03/2020  Conception Chancy, PsyD

## 2021-09-09 ENCOUNTER — Other Ambulatory Visit (HOSPITAL_COMMUNITY): Payer: Self-pay

## 2021-09-09 ENCOUNTER — Other Ambulatory Visit (HOSPITAL_BASED_OUTPATIENT_CLINIC_OR_DEPARTMENT_OTHER): Payer: Self-pay

## 2021-09-10 ENCOUNTER — Ambulatory Visit (INDEPENDENT_AMBULATORY_CARE_PROVIDER_SITE_OTHER): Payer: 59 | Admitting: Psychologist

## 2021-09-10 ENCOUNTER — Other Ambulatory Visit (HOSPITAL_BASED_OUTPATIENT_CLINIC_OR_DEPARTMENT_OTHER): Payer: Self-pay

## 2021-09-10 ENCOUNTER — Other Ambulatory Visit (HOSPITAL_COMMUNITY): Payer: Self-pay

## 2021-09-10 DIAGNOSIS — F411 Generalized anxiety disorder: Secondary | ICD-10-CM | POA: Diagnosis not present

## 2021-09-10 DIAGNOSIS — F331 Major depressive disorder, recurrent, moderate: Secondary | ICD-10-CM | POA: Diagnosis not present

## 2021-09-10 NOTE — Progress Notes (Signed)
[place] Counselor/Therapist Progress Note  Patient ID: Jesse Bowers, MRN: [id number],    Date: 09/10/2021  Time Spent: 11:05 am to 11:43 am; total time: 38 minutes   This session was held via video webex teletherapy due to the coronavirus risk at this time. The patient consented to video teletherapy and was located at his home during this session. He is aware it is the responsibility of the patient to secure confidentiality on his end of the session. The provider was in a private home office for the duration of this session. Limits of confidentiality were discussed with the patient.   Treatment Type: Individual Therapy  Reported Symptoms: Experiencing depression  Mental Status Exam: Appearance:  Casual     Behavior: Appropriate  Motor: Normal  Speech/Language:  Normal Rate  Affect: Appropriate  Mood: normal  Thought process: normal  Thought content:   WNL  Sensory/Perceptual disturbances:   WNL  Orientation: oriented to person, place, and time/date  Attention: Good  Concentration: Good  Memory: WNL  Fund of knowledge:  Good  Insight:   Fair  Judgment:  Fair  Impulse Control: Good   Risk Assessment: Danger to Self:  No Self-injurious Behavior: No Danger to Others: No Duty to Warn:no Physical Aggression / Violence:No  Access to Firearms a concern: No  Gang Involvement:No   Subjective: Patient began the session the patient described himself as well. Patient spent the session reflecting on different ways to get socialization needs met. He asked to follow up. He denied suicidal and homicidal ideation.    Interventions:  Worked on developing a therapeutic relationship with the patient. Used active listening and reflective statements. Provided emotional support using empathy and validation. Used summary statements. Normalized and validated expressed thoughts and emotions. Reviewed goals for the session. Used socratic questions to assist the patient gain insight  into self. Processed different ways to get social needs met. Used MI to roll with the resistance. Used a Product/process development scientist to assist the patient. Processed thoughts and emotions. Challenged some of the thoughts expressed. Reflected on what specific social needs patient wants to get met. Provided empathic statements. Assigned homework. Assessed for suicidal and homicidal ideation.  Homework: Look into different groups to get social needs met  Next Session: Emotional support, and discuss how to get socialization needs met  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 disorder, recurrent, moderate   Plan:   Goals Alleviate depressive symptoms Recognize, accept, and cope with depressive feelings Develop healthy thinking patterns Develop healthy interpersonal relationships Reduce overall frequency, intensity, and duration of anxiety Stabilize anxiety level wile increasing ability to function Enhance ability to effectively cope with full variety of stressors Learn and implement coping skills that result in a reduction of anxiety   Objectives target date for all objectives is: 09/26/2021 Verbalize an understanding of the cognitive, physiological, and behavioral components of anxiety Learning and implement calming skills to reduce overall anxiety Verbalize an understanding of the role that cognitive biases play in excessive irrational worry and persistent anxiety symptoms Identify, challenge, and replace based fearful talk Learn and implement problem solving strategies Identify and engage in pleasant activities Learning and implement personal and interpersonal skills to reduce anxiety and improve interpersonal relationships Learn to accept limitations in life and commit to tolerating, rather than avoiding, unpleasant emotions while accomplishing meaningful goals Identify major life conflicts from the past and present that form the basis for present anxiety Maintain  involvement in work, family, and social activities Reestablish a  consistent sleep-wake cycle Cooperate with a medical evaluation  Cooperate with a medication evaluation by a physician Verbalize an accurate understanding of depression Verbalize an understanding of the treatment Identify and replace thoughts that support depression Learn and implement behavioral strategies Verbalize an understanding and resolution of current interpersonal problems Learn and implement problem solving and decision making skills Learn and implement conflict resolution skills to resolve interpersonal problems Verbalize an understanding of healthy and unhealthy emotions verbalize insight into how past relationships may be influence current experiences with depression Use mindfulness and acceptance strategies and increase value based behavior  Increase hopeful statements about the future.  Interventions Engage the patient in behavioral activation Use instruction, modeling, and role-playing to build the client's general social, communication, and/or conflict resolution skills Use Acceptance and Commitment Therapy to help client accept uncomfortable realities in order to accomplish value-consistent goals Reinforce the client's insight into the role of his/her past emotional pain and present anxiety  Support the client in following through with work, family, and social activities Teach and implement sleep hygiene practices  Refer the patient to a physician for a psychotropic medication consultation Monito the clint's psychotropic medication compliance Discuss how anxiety typically involves excessive worry, various bodily expressions of tension, and avoidance of what is threatening that interact to maintain the problem  Teach the patient relaxation skills Assign the patient homework Discuss examples demonstrating that unrealistic worry overestimates the probability of threats and underestimates patient's ability   Assist the patient in analyzing his or her worries Help patient understand that avoidance is reinforcing  Consistent with treatment model, discuss how change in cognitive, behavioral, and interpersonal can help client alleviate depression CBT Behavioral activation help the client explore the relationship, nature of the dispute,  Help the client develop new interpersonal skills and relationships Conduct Problem solving therapy Teach conflict resolution skills Use a process-experiential approach Conduct TLDP Conduct ACT Evaluate need for psychotropic medication Monitor adherence to medication   Patient agreed and reviewed the treatment plan on 10/03/2020  Conception Chancy, PsyD

## 2021-09-11 ENCOUNTER — Other Ambulatory Visit (HOSPITAL_COMMUNITY): Payer: Self-pay

## 2021-09-11 ENCOUNTER — Other Ambulatory Visit (HOSPITAL_BASED_OUTPATIENT_CLINIC_OR_DEPARTMENT_OTHER): Payer: Self-pay

## 2021-09-12 ENCOUNTER — Other Ambulatory Visit (HOSPITAL_BASED_OUTPATIENT_CLINIC_OR_DEPARTMENT_OTHER): Payer: Self-pay

## 2021-09-12 ENCOUNTER — Other Ambulatory Visit (HOSPITAL_COMMUNITY): Payer: Self-pay

## 2021-09-12 MED ORDER — CLORAZEPATE DIPOTASSIUM 15 MG PO TABS
ORAL_TABLET | ORAL | 1 refills | Status: DC
Start: 2021-09-12 — End: 2021-11-05
  Filled 2021-09-12 – 2021-10-08 (×2): qty 90, 90d supply, fill #0

## 2021-09-12 MED ORDER — CLORAZEPATE DIPOTASSIUM 7.5 MG PO TABS
ORAL_TABLET | ORAL | 1 refills | Status: DC
Start: 2021-09-12 — End: 2021-11-05
  Filled 2021-09-12 – 2021-10-08 (×2): qty 180, 90d supply, fill #0

## 2021-09-12 MED ORDER — DESVENLAFAXINE SUCCINATE ER 100 MG PO TB24
100.0000 mg | ORAL_TABLET | Freq: Every day | ORAL | 1 refills | Status: DC
  Filled 2021-09-12: qty 90, 90d supply, fill #0
  Filled 2021-11-05 – 2021-12-20 (×2): qty 90, 90d supply, fill #1

## 2021-09-13 ENCOUNTER — Other Ambulatory Visit (HOSPITAL_COMMUNITY): Payer: Self-pay

## 2021-09-13 ENCOUNTER — Other Ambulatory Visit (HOSPITAL_BASED_OUTPATIENT_CLINIC_OR_DEPARTMENT_OTHER): Payer: Self-pay

## 2021-09-16 ENCOUNTER — Other Ambulatory Visit (HOSPITAL_COMMUNITY): Payer: Self-pay

## 2021-09-18 ENCOUNTER — Other Ambulatory Visit (HOSPITAL_COMMUNITY): Payer: Self-pay

## 2021-09-20 ENCOUNTER — Other Ambulatory Visit (HOSPITAL_COMMUNITY): Payer: Self-pay

## 2021-09-20 ENCOUNTER — Other Ambulatory Visit (HOSPITAL_BASED_OUTPATIENT_CLINIC_OR_DEPARTMENT_OTHER): Payer: Self-pay

## 2021-09-20 ENCOUNTER — Ambulatory Visit (INDEPENDENT_AMBULATORY_CARE_PROVIDER_SITE_OTHER): Payer: 59 | Admitting: Psychologist

## 2021-09-20 DIAGNOSIS — F411 Generalized anxiety disorder: Secondary | ICD-10-CM | POA: Diagnosis not present

## 2021-09-20 DIAGNOSIS — F331 Major depressive disorder, recurrent, moderate: Secondary | ICD-10-CM | POA: Diagnosis not present

## 2021-09-20 MED ORDER — METOPROLOL TARTRATE 25 MG PO TABS
25.0000 mg | ORAL_TABLET | Freq: Two times a day (BID) | ORAL | 3 refills | Status: DC | PRN
  Filled 2021-09-20: qty 30, 15d supply, fill #0
  Filled 2021-11-26: qty 30, 15d supply, fill #1

## 2021-09-20 NOTE — Telephone Encounter (Signed)
OK to take metoprolol tartrate 25 mg up to two times daily as needed. #30 RF3

## 2021-09-20 NOTE — Progress Notes (Signed)
[place] Counselor/Therapist Progress Note  Patient ID: Kaniel Kiang, MRN: [id number],    Date: 09/20/2021  Time Spent: 01:05 pm to 01:43 pm; total time: 38 minutes   This session was held via video webex teletherapy due to the coronavirus risk at this time. The patient consented to video teletherapy and was located at his home during this session. He is aware it is the responsibility of the patient to secure confidentiality on his end of the session. The provider was in a private home office for the duration of this session. Limits of confidentiality were discussed with the patient.   Treatment Type: Individual Therapy  Reported Symptoms: Experiencing depression  Mental Status Exam: Appearance:  Casual     Behavior: Appropriate  Motor: Normal  Speech/Language:  Normal Rate  Affect: Appropriate  Mood: normal  Thought process: normal  Thought content:   WNL  Sensory/Perceptual disturbances:   WNL  Orientation: oriented to person, place, and time/date  Attention: Good  Concentration: Good  Memory: WNL  Fund of knowledge:  Good  Insight:   Fair  Judgment:  Fair  Impulse Control: Good   Risk Assessment: Danger to Self:  No Self-injurious Behavior: No Danger to Others: No Duty to Warn:no Physical Aggression / Violence:No  Access to Firearms a concern: No  Gang Involvement:No   Subjective: Patient began the session the patient described himself as well and indicated that he had completed his homework. Patient processed completing the assignment. He also reflected on the next step in the assignment to make deeper relationships. He processed steps he could take to enjoy his birthday.  He asked to follow up. He denied suicidal and homicidal ideation.    Interventions:  Worked on developing a therapeutic relationship with the patient. Used active listening and reflective statements. Provided emotional support using empathy and validation. Used summary statements.  Normalized and validated expressed thoughts and emotions. Praised the patient for working on deeper more meaningful socialization through groups. Processed the completed homework. Used socratic questions to assist the patient. Processed patient's birthday. Explored steps to get needs met on birthday. Provided empathic statements. Assigned homework. Assessed for suicidal and homicidal ideation.  Homework: Look into different groups to get deeper connection  Next Session: Emotional support, and review homework. Process birthday  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 disorder, recurrent, moderate   Plan:   Goals Alleviate depressive symptoms Recognize, accept, and cope with depressive feelings Develop healthy thinking patterns Develop healthy interpersonal relationships Reduce overall frequency, intensity, and duration of anxiety Stabilize anxiety level wile increasing ability to function Enhance ability to effectively cope with full variety of stressors Learn and implement coping skills that result in a reduction of anxiety   Objectives target date for all objectives is: 09/26/2021 Verbalize an understanding of the cognitive, physiological, and behavioral components of anxiety Learning and implement calming skills to reduce overall anxiety Verbalize an understanding of the role that cognitive biases play in excessive irrational worry and persistent anxiety symptoms Identify, challenge, and replace based fearful talk Learn and implement problem solving strategies Identify and engage in pleasant activities Learning and implement personal and interpersonal skills to reduce anxiety and improve interpersonal relationships Learn to accept limitations in life and commit to tolerating, rather than avoiding, unpleasant emotions while accomplishing meaningful goals Identify major life conflicts from the past and present that form the basis for present  anxiety Maintain involvement in work, family, and social activities Reestablish a consistent sleep-wake cycle Cooperate  with a medical evaluation  Cooperate with a medication evaluation by a physician Verbalize an accurate understanding of depression Verbalize an understanding of the treatment Identify and replace thoughts that support depression Learn and implement behavioral strategies Verbalize an understanding and resolution of current interpersonal problems Learn and implement problem solving and decision making skills Learn and implement conflict resolution skills to resolve interpersonal problems Verbalize an understanding of healthy and unhealthy emotions verbalize insight into how past relationships may be influence current experiences with depression Use mindfulness and acceptance strategies and increase value based behavior  Increase hopeful statements about the future.  Interventions Engage the patient in behavioral activation Use instruction, modeling, and role-playing to build the client's general social, communication, and/or conflict resolution skills Use Acceptance and Commitment Therapy to help client accept uncomfortable realities in order to accomplish value-consistent goals Reinforce the client's insight into the role of his/her past emotional pain and present anxiety  Support the client in following through with work, family, and social activities Teach and implement sleep hygiene practices  Refer the patient to a physician for a psychotropic medication consultation Monito the clint's psychotropic medication compliance Discuss how anxiety typically involves excessive worry, various bodily expressions of tension, and avoidance of what is threatening that interact to maintain the problem  Teach the patient relaxation skills Assign the patient homework Discuss examples demonstrating that unrealistic worry overestimates the probability of threats and underestimates  patient's ability  Assist the patient in analyzing his or her worries Help patient understand that avoidance is reinforcing  Consistent with treatment model, discuss how change in cognitive, behavioral, and interpersonal can help client alleviate depression CBT Behavioral activation help the client explore the relationship, nature of the dispute,  Help the client develop new interpersonal skills and relationships Conduct Problem solving therapy Teach conflict resolution skills Use a process-experiential approach Conduct TLDP Conduct ACT Evaluate need for psychotropic medication Monitor adherence to medication   Patient agreed and reviewed the treatment plan on 10/03/2020  Conception Chancy, PsyD

## 2021-09-23 ENCOUNTER — Other Ambulatory Visit (HOSPITAL_BASED_OUTPATIENT_CLINIC_OR_DEPARTMENT_OTHER): Payer: Self-pay

## 2021-09-23 ENCOUNTER — Encounter: Payer: Self-pay | Admitting: Physical Medicine & Rehabilitation

## 2021-09-24 ENCOUNTER — Encounter: Payer: Self-pay | Admitting: Physical Medicine & Rehabilitation

## 2021-09-24 ENCOUNTER — Other Ambulatory Visit (HOSPITAL_BASED_OUTPATIENT_CLINIC_OR_DEPARTMENT_OTHER): Payer: Self-pay

## 2021-09-24 DIAGNOSIS — G894 Chronic pain syndrome: Secondary | ICD-10-CM

## 2021-09-24 DIAGNOSIS — M5416 Radiculopathy, lumbar region: Secondary | ICD-10-CM

## 2021-09-24 MED ORDER — LINACLOTIDE 290 MCG PO CAPS: ORAL_CAPSULE | ORAL | 0 refills | Status: DC

## 2021-09-24 MED ORDER — VITAMIN D (ERGOCALCIFEROL) 1.25 MG (50000 UNIT) PO CAPS
ORAL_CAPSULE | ORAL | 0 refills | Status: DC
  Filled 2021-09-24: qty 12, 84d supply, fill #0

## 2021-09-24 MED ORDER — BACLOFEN 20 MG PO TABS
20.0000 mg | ORAL_TABLET | Freq: Four times a day (QID) | ORAL | 2 refills | Status: DC
  Filled 2021-09-24: qty 50, 10d supply, fill #0
  Filled 2021-09-27: qty 400, 80d supply, fill #1
  Filled 2021-11-29 – 2021-12-02 (×2): qty 400, 80d supply, fill #2
  Filled 2022-03-03: qty 400, 80d supply, fill #3
  Filled 2022-05-22: qty 100, 25d supply, fill #4

## 2021-09-24 MED ORDER — BACLOFEN 20 MG PO TABS
20.0000 mg | ORAL_TABLET | Freq: Every day | ORAL | 2 refills | Status: DC
  Filled 2021-09-24: qty 450, 90d supply, fill #0

## 2021-09-24 NOTE — Telephone Encounter (Signed)
Rx written and sent to the pharmacy. Good luck!

## 2021-09-24 NOTE — Addendum Note (Signed)
Addended by: Alger Simons T on: 09/24/2021 04:59 PM   Modules accepted: Orders

## 2021-09-24 NOTE — Telephone Encounter (Signed)
Prescription changed to reflect "4 x daily"

## 2021-09-25 ENCOUNTER — Other Ambulatory Visit (HOSPITAL_BASED_OUTPATIENT_CLINIC_OR_DEPARTMENT_OTHER): Payer: Self-pay

## 2021-09-25 ENCOUNTER — Telehealth: Payer: Self-pay

## 2021-09-25 ENCOUNTER — Ambulatory Visit (INDEPENDENT_AMBULATORY_CARE_PROVIDER_SITE_OTHER): Payer: 59 | Admitting: Psychologist

## 2021-09-25 ENCOUNTER — Other Ambulatory Visit (HOSPITAL_COMMUNITY): Payer: Self-pay

## 2021-09-25 DIAGNOSIS — F411 Generalized anxiety disorder: Secondary | ICD-10-CM | POA: Diagnosis not present

## 2021-09-25 DIAGNOSIS — F331 Major depressive disorder, recurrent, moderate: Secondary | ICD-10-CM

## 2021-09-25 NOTE — Telephone Encounter (Signed)
PA for Baclofen sent to insurance through [facility]

## 2021-09-25 NOTE — Progress Notes (Signed)
[facility] Counselor/Therapist Progress Note  Patient ID: Jesse Bowers, MRN: [id number],    Date: 09/25/2021  Time Spent: 11:07 am to 11:47 am; total time: 40 minutes   This session was held via video webex teletherapy due to the coronavirus risk at this time. The patient consented to video teletherapy and was located at his home during this session. He is aware it is the responsibility of the patient to secure confidentiality on his end of the session. The provider was in a private home office for the duration of this session. Limits of confidentiality were discussed with the patient.   Treatment Type: Individual Therapy  Reported Symptoms: Experiencing anxiety  Mental Status Exam: Appearance:  Casual     Behavior: Appropriate  Motor: Normal  Speech/Language:  Normal Rate  Affect: Appropriate  Mood: normal  Thought process: normal  Thought content:   WNL  Sensory/Perceptual disturbances:   WNL  Orientation: oriented to person, place, and time/date  Attention: Good  Concentration: Good  Memory: WNL  Fund of knowledge:  Good  Insight:   Fair  Judgment:  Fair  Impulse Control: Good   Risk Assessment: Danger to Self:  No Self-injurious Behavior: No Danger to Others: No Duty to Warn:no Physical Aggression / Violence:No  Access to Firearms a concern: No  Gang Involvement:No   Subjective: Patient began the session the patient described himself as feeling anxious about the upcoming family wedding he is attending this weekend. He processed the events contributing to anxiety. He reflected on strategies to assist in decreasing the anxiety. He asked to follow up. He denied suicidal and homicidal ideation.    Interventions:  Worked on developing a therapeutic relationship with the patient. Used active listening and reflective statements. Provided emotional support using empathy and validation. Reflected on events since the last session. Normalized and validated the  anxiety patient was experiencing related to the wedding. Processed what factors are contributing to the anxiety. Used socratic questions to assist the patient. Challenged some of the thoughts expressed Assisted in problem solving. Validated thoughts and emotions. Provided empathic statements. Assigned homework. Assessed for suicidal and homicidal ideation.  Homework: Come up with a list of topics to discuss; list of family members, possible date with wife  Next Session: Emotional support, and review homework. Process wedding  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 disorder, recurrent, moderate   Plan:   Goals Alleviate depressive symptoms Recognize, accept, and cope with depressive feelings Develop healthy thinking patterns Develop healthy interpersonal relationships Reduce overall frequency, intensity, and duration of anxiety Stabilize anxiety level wile increasing ability to function Enhance ability to effectively cope with full variety of stressors Learn and implement coping skills that result in a reduction of anxiety   Objectives target date for all objectives is: 09/27/2022 Verbalize an understanding of the cognitive, physiological, and behavioral components of anxiety Learning and implement calming skills to reduce overall anxiety Verbalize an understanding of the role that cognitive biases play in excessive irrational worry and persistent anxiety symptoms Identify, challenge, and replace based fearful talk Learn and implement problem solving strategies Identify and engage in pleasant activities Learning and implement personal and interpersonal skills to reduce anxiety and improve interpersonal relationships Learn to accept limitations in life and commit to tolerating, rather than avoiding, unpleasant emotions while accomplishing meaningful goals Identify major life conflicts from the past and present that form the basis for present  anxiety Maintain involvement in work, family, and social activities Reestablish a consistent  sleep-wake cycle Cooperate with a medical evaluation  Cooperate with a medication evaluation by a physician Verbalize an accurate understanding of depression Verbalize an understanding of the treatment Identify and replace thoughts that support depression Learn and implement behavioral strategies Verbalize an understanding and resolution of current interpersonal problems Learn and implement problem solving and decision making skills Learn and implement conflict resolution skills to resolve interpersonal problems Verbalize an understanding of healthy and unhealthy emotions verbalize insight into how past relationships may be influence current experiences with depression Use mindfulness and acceptance strategies and increase value based behavior  Increase hopeful statements about the future.  Interventions Engage the patient in behavioral activation Use instruction, modeling, and role-playing to build the client's general social, communication, and/or conflict resolution skills Use Acceptance and Commitment Therapy to help client accept uncomfortable realities in order to accomplish value-consistent goals Reinforce the client's insight into the role of his/her past emotional pain and present anxiety  Support the client in following through with work, family, and social activities Teach and implement sleep hygiene practices  Refer the patient to a physician for a psychotropic medication consultation Monito the clint's psychotropic medication compliance Discuss how anxiety typically involves excessive worry, various bodily expressions of tension, and avoidance of what is threatening that interact to maintain the problem  Teach the patient relaxation skills Assign the patient homework Discuss examples demonstrating that unrealistic worry overestimates the probability of threats and underestimates  patient's ability  Assist the patient in analyzing his or her worries Help patient understand that avoidance is reinforcing  Consistent with treatment model, discuss how change in cognitive, behavioral, and interpersonal can help client alleviate depression CBT Behavioral activation help the client explore the relationship, nature of the dispute,  Help the client develop new interpersonal skills and relationships Conduct Problem solving therapy Teach conflict resolution skills Use a process-experiential approach Conduct TLDP Conduct ACT Evaluate need for psychotropic medication Monitor adherence to medication   Patient agreed and reviewed the treatment plan on 10/03/2020. Reassessed the treatment plan. Patient approved of the reassessment on 09/25/2021.   Conception Chancy, PsyD

## 2021-09-27 ENCOUNTER — Other Ambulatory Visit (HOSPITAL_BASED_OUTPATIENT_CLINIC_OR_DEPARTMENT_OTHER): Payer: Self-pay

## 2021-09-27 NOTE — Telephone Encounter (Signed)
Received a fax from insurance approving Baclofen. Approval 09/27/21-09/27/22

## 2021-09-30 ENCOUNTER — Other Ambulatory Visit (HOSPITAL_COMMUNITY): Payer: Self-pay

## 2021-10-01 ENCOUNTER — Encounter: Payer: Self-pay | Admitting: Physical Medicine & Rehabilitation

## 2021-10-01 ENCOUNTER — Other Ambulatory Visit (HOSPITAL_BASED_OUTPATIENT_CLINIC_OR_DEPARTMENT_OTHER): Payer: Self-pay

## 2021-10-01 ENCOUNTER — Encounter (HOSPITAL_BASED_OUTPATIENT_CLINIC_OR_DEPARTMENT_OTHER): Payer: Self-pay

## 2021-10-01 ENCOUNTER — Encounter: Payer: Self-pay | Admitting: Physician Assistant

## 2021-10-01 ENCOUNTER — Telehealth: Payer: 59 | Admitting: Physician Assistant

## 2021-10-01 DIAGNOSIS — U071 COVID-19: Secondary | ICD-10-CM | POA: Diagnosis not present

## 2021-10-01 MED ORDER — MOLNUPIRAVIR EUA 200MG CAPSULE
4.0000 | ORAL_CAPSULE | Freq: Two times a day (BID) | ORAL | 0 refills | Status: DC
  Filled 2021-10-01: qty 40, 5d supply, fill #0

## 2021-10-01 NOTE — Patient Instructions (Signed)
[name], thank you for joining Mar Daring, PA-C for today's virtual visit.  While this provider is not your primary care provider (PCP), if your PCP is located in our provider database this encounter information will be shared with them immediately following your visit.  Consent: (Patient) Jesse Bowers provided verbal consent for this virtual visit at the beginning of the encounter.  Current Medications:  Current Outpatient Medications:    molnupiravir EUA (LAGEVRIO) 200 mg CAPS capsule, Take 4 capsules (800 mg total) by mouth 2 (two) times daily for 5 days., Disp: 40 capsule, Rfl: 0   acyclovir ointment (ZOVIRAX) 5 %, as needed., Disp: , Rfl:    baclofen (LIORESAL) 20 MG tablet, Take 1-2 tablets (20-40 mg total) by mouth 4 (four) times daily. 1 tablet with breakfast, lunch, and dinner and 2 tablets at bedtime, Disp: 450 each, Rfl: 2   Calcipotriene-Betameth Diprop 0.005-0.064 % FOAM, Apply 1 application. topically 2 (two) times daily as needed., Disp: , Rfl:    Calcium Carbonate Antacid (TUMS ULTRA 1000 PO), Take by mouth daily., Disp: , Rfl:    ciprofloxacin-dexamethasone (CIPRODEX) OTIC suspension, , Disp: , Rfl:    Clobetasol Propionate Emulsion 0.05 % topical foam, daily., Disp: , Rfl:    Clobetasol Propionate Emulsion 0.05 % topical foam, Apply a thin layer to the affected area(s) on the scalp, Disp: 100 g, Rfl: 5   clorazepate (TRANXENE) 15 MG tablet, Take 15 mg by mouth at bedtime., Disp: , Rfl:    clorazepate (TRANXENE) 15 MG tablet, Take 1 tab at night, Disp: 90 tablet, Rfl: 1   clorazepate (TRANXENE) 7.5 MG tablet, Take 7.5 mg by mouth 2 (two) times daily., Disp: , Rfl:    clorazepate (TRANXENE) 7.5 MG tablet, Take 1 tab 2 times a day (also takes 42m at night), Disp: 180 tablet, Rfl: 1   desonide (DESOWEN) 0.05 % cream, Apply topically 2 (two) times daily., Disp: , Rfl:    desonide (DESOWEN) 0.05 % cream, 1(one) application(s) topical 2(two) times a day, Disp: 60 g,  Rfl: 2   desvenlafaxine (PRISTIQ) 100 MG 24 hr tablet, Take 100 mg by mouth daily., Disp: , Rfl:    desvenlafaxine (PRISTIQ) 100 MG 24 hr tablet, Take 1 tab daily,, Disp: 90 tablet, Rfl: 1   ergocalciferol (VITAMIN D2) 1.25 MG (50000 UT) capsule, Take 1 capsule by mouth once a week., Disp: , Rfl:    FIBER SELECT GUMMIES PO, Take by mouth daily., Disp: , Rfl:    folic acid (FOLVITE) 1 MG tablet, Take 1 tablet by mouth every day., Disp: 90 tablet, Rfl: 1   furosemide (LASIX) 20 MG tablet, Take 1 tablet (20 mg total) by mouth daily as needed for edema., Disp: 30 tablet, Rfl: 1   furosemide (LASIX) 80 MG tablet, Take 1 tablet (80 mg total) by mouth daily., Disp: 90 tablet, Rfl: 3   halobetasol (ULTRAVATE) 0.05 % cream, Apply topically., Disp: , Rfl:    halobetasol (ULTRAVATE) 0.05 % cream, Apply to affected area twice daily to hands, feet and knees., Disp: 50 g, Rfl: 1   linaclotide (LINZESS) 290 MCG CAPS capsule, Take 1 capsule by mouth daily before breakfast., Disp: 30 capsule, Rfl: 8   linaclotide (LINZESS) 290 MCG CAPS capsule, Take 1 capsule by mouth once daily., Disp: 90 capsule, Rfl: 0   methotrexate (RHEUMATREX) 2.5 MG tablet, Take 15 mg by mouth once a week. Caution:Chemotherapy. Protect from light., Disp: , Rfl:    methotrexate (RHEUMATREX) 2.5 MG tablet,  Take 6.0 tablets po once weekly, Disp: 48 tablet, Rfl: 0   metoprolol tartrate (LOPRESSOR) 25 MG tablet, Take 1 tablet (25 mg total) by mouth 2 (two) times daily as needed., Disp: 30 tablet, Rfl: 3   morphine (MS CONTIN) 15 MG 12 hr tablet, Take 1 tablet (15 mg total) by mouth every 12 (twelve) hours., Disp: 60 tablet, Rfl: 0   morphine (MS CONTIN) 30 MG 12 hr tablet, Take 1 tablet (30 mg total) by mouth every 12 (twelve) hours., Disp: 60 tablet, Rfl: 0   Multiple Vitamins-Minerals (ALIVE MULTI-VITAMIN) CHEW, , Disp: , Rfl:    naloxegol oxalate (MOVANTIK) 25 MG TABS tablet, Take 1 tablet (25 mg total) by mouth daily., Disp: 30 tablet, Rfl:  2   Oxycodone HCl 10 MG TABS, Take 1 tablet (10 mg total) by mouth every 8 (eight) hours as needed (pain)., Disp: 85 tablet, Rfl: 0   polyethylene glycol powder (GLYCOLAX/MIRALAX) 17 GM/SCOOP powder, Purelax 17 gram/dose oral powder  TAKE 1 CAPFUL MIXED INTO 8 OZ OF CLEAR LIQUID UP TO FOUR TIMES A DAY AS NEEDED, Disp: , Rfl:    potassium chloride (KLOR-CON) 10 MEQ tablet, Take by mouth., Disp: , Rfl:    potassium chloride (KLOR-CON) 10 MEQ tablet, Take 2 tablets by mouth every day., Disp: 180 tablet, Rfl: 3   RESTASIS 0.05 % ophthalmic emulsion, 1 drop 2 (two) times daily., Disp: , Rfl:    rivaroxaban (XARELTO) 20 MG TABS tablet, Take 1 tablet (20 mg total) by mouth nightly., Disp: 90 tablet, Rfl: 0   Secukinumab (COSENTYX SENSOREADY PEN) 150 MG/ML SOAJ, Inject 2 pens subcutaneously every 4 weeks, Disp: 2 mL, Rfl: 9   Tenapanor HCl (IBSRELA) 50 MG TABS, Take 1 tablet (50 mg total) by mouth 2 times daily., Disp: 180 tablet, Rfl: 1   testosterone cypionate (DEPOTESTOSTERONE CYPIONATE) 200 MG/ML injection, INJECT 0.5 MLS (100 MG TOTAL) INTO THE MUSCLE ONCE A WEEK., Disp: 10 mL, Rfl: 0   ursodiol (ACTIGALL) 250 MG tablet, Take 1 tablet by mouth at lunch and dinner in addition to ursodiol 500 mg for a total 750 mg dose., Disp: 60 tablet, Rfl: 7   ursodiol (ACTIGALL) 500 MG tablet, Take 1 tablet (500 mg) by mouth at lunch and dinner in addition to ursodiol 250 mg for a total 750 mg dose., Disp: 60 tablet, Rfl: 11   Vitamin D, Ergocalciferol, (DRISDOL) 1.25 MG (50000 UNIT) CAPS capsule, Take 1 capsule by mouth once weekly., Disp: 12 capsule, Rfl: 0   XARELTO 20 MG TABS tablet, TAKE 1 TABLET(20 MG) BY MOUTH EVERY NIGHT AT BEDTIME, Disp: 30 tablet, Rfl: 11   Medications ordered in this encounter:  Meds ordered this encounter  Medications   molnupiravir EUA (LAGEVRIO) 200 mg CAPS capsule    Sig: Take 4 capsules (800 mg total) by mouth 2 (two) times daily for 5 days.    Dispense:  40 capsule    Refill:  0     Order Specific Question:   Supervising Provider    Answer:   Sabra Heck, [place]     *If you need refills on other medications prior to your next appointment, please contact your pharmacy*  Follow-Up: Call back or seek an in-person evaluation if the symptoms worsen or if the condition fails to improve as anticipated.  Other Instructions COVID-19: Quarantine and Isolation Quarantine If you were exposed Quarantine and stay away from others when you have been in close contact with someone who has COVID-19. Isolate If  you are sick or test positive Isolate when you are sick or when you have COVID-19, even if you don't have symptoms. When to stay home Calculating quarantine The date of your exposure is considered day 0. Day 1 is the first full day after your last contact with a person who has had COVID-19. Stay home and away from other people for at least 5 days. Learn why CDC updated guidance for the general public. IF YOU were exposed to COVID-19 and are NOT  up to dateIF YOU were exposed to COVID-19 and are NOT on COVID-19 vaccinations Quarantine for at least 5 days Stay home Stay home and quarantine for at least 5 full days. Wear a well-fitting mask if you must be around others in your home. Do not travel. Get tested Even if you don't develop symptoms, get tested at least 5 days after you last had close contact with someone with COVID-19. After quarantine Watch for symptoms Watch for symptoms until 10 days after you last had close contact with someone with COVID-19. Avoid travel It is best to avoid travel until a full 10 days after you last had close contact with someone with COVID-19. If you develop symptoms Isolate immediately and get tested. Continue to stay home until you know the results. Wear a well-fitting mask around others. Take precautions until day 10 Wear a well-fitting mask Wear a well-fitting mask for 10 full days any time you are around others inside your home  or in public. Do not go to places where you are unable to wear a well-fitting mask. If you must travel during days 6-10, take precautions. Avoid being around people who are more likely to get very sick from COVID-19. IF YOU were exposed to COVID-19 and are  up to dateIF YOU were exposed to COVID-19 and are on COVID-19 vaccinations No quarantine You do not need to stay home unless you develop symptoms. Get tested Even if you don't develop symptoms, get tested at least 5 days after you last had close contact with someone with COVID-19. Watch for symptoms Watch for symptoms until 10 days after you last had close contact with someone with COVID-19. If you develop symptoms Isolate immediately and get tested. Continue to stay home until you know the results. Wear a well-fitting mask around others. Take precautions until day 10 Wear a well-fitting mask Wear a well-fitting mask for 10 full days any time you are around others inside your home or in public. Do not go to places where you are unable to wear a well-fitting mask. Take precautions if traveling Avoid being around people who are more likely to get very sick from COVID-19. IF YOU were exposed to COVID-19 and had confirmed COVID-19 within the past 90 days (you tested positive using a viral test) No quarantine You do not need to stay home unless you develop symptoms. Watch for symptoms Watch for symptoms until 10 days after you last had close contact with someone with COVID-19. If you develop symptoms Isolate immediately and get tested. Continue to stay home until you know the results. Wear a well-fitting mask around others. Take precautions until day 10 Wear a well-fitting mask Wear a well-fitting mask for 10 full days any time you are around others inside your home or in public. Do not go to places where you are unable to wear a well-fitting mask. Take precautions if traveling Avoid being around people who are more likely to get very sick  from COVID-19. Calculating isolation Day 0  is your first day of symptoms or a positive viral test. Day 1 is the first full day after your symptoms developed or your test specimen was collected. If you have COVID-19 or have symptoms, isolate for at least 5 days. IF YOU tested positive for COVID-19 or have symptoms, regardless of vaccination status Stay home for at least 5 days Stay home for 5 days and isolate from others in your home. Wear a well-fitting mask if you must be around others in your home. Do not travel. Ending isolation if you had symptoms End isolation after 5 full days if you are fever-free for 24 hours (without the use of fever-reducing medication) and your symptoms are improving. Ending isolation if you did NOT have symptoms End isolation after at least 5 full days after your positive test. If you got very sick from COVID-19 or have a weakened immune system You should isolate for at least 10 days. Consult your doctor before ending isolation. Take precautions until day 10 Wear a well-fitting mask Wear a well-fitting mask for 10 full days any time you are around others inside your home or in public. Do not go to places where you are unable to wear a well-fitting mask. Do not travel Do not travel until a full 10 days after your symptoms started or the date your positive test was taken if you had no symptoms. Avoid being around people who are more likely to get very sick from COVID-19. Definitions Exposure Contact with someone infected with SARS-CoV-2, the virus that causes COVID-19, in a way that increases the likelihood of getting infected with the virus. Close contact A close contact is someone who was less than 6 feet away from an infected person (laboratory-confirmed or a clinical diagnosis) for a cumulative total of 15 minutes or more over a 24-hour period. For example, three individual 5-minute exposures for a total of 15 minutes. People who are exposed to someone with  COVID-19 after they completed at least 5 days of isolation are not considered close contacts. Julio Sicks is a strategy used to prevent transmission of COVID-19 by keeping people who have been in close contact with someone with COVID-19 apart from others. Who does not need to quarantine? If you had close contact with someone with COVID-19 and you are in one of the following groups, you do not need to quarantine. You are up to date with your COVID-19 vaccines. You had confirmed COVID-19 within the last 90 days (meaning you tested positive using a viral test). If you are up to date with COVID-19 vaccines, you should wear a well-fitting mask around others for 10 days from the date of your last close contact with someone with COVID-19 (the date of last close contact is considered day 0). Get tested at least 5 days after you last had close contact with someone with COVID-19. If you test positive or develop COVID-19 symptoms, isolate from other people and follow recommendations in the Isolation section below. If you tested positive for COVID-19 with a viral test within the previous 90 days and subsequently recovered and remain without COVID-19 symptoms, you do not need to quarantine or get tested after close contact. You should wear a well-fitting mask around others for 10 days from the date of your last close contact with someone with COVID-19 (the date of last close contact is considered day 0). If you have COVID-19 symptoms, get tested and isolate from other people and follow recommendations in the Isolation section below. Who should quarantine?  If you come into close contact with someone with COVID-19, you should quarantine if you are not up to date on COVID-19 vaccines. This includes people who are not vaccinated. What to do for quarantine Stay home and away from other people for at least 5 days (day 0 through day 5) after your last contact with a person who has COVID-19. The date of your  exposure is considered day 0. Wear a well-fitting mask when around others at home, if possible. For 10 days after your last close contact with someone with COVID-19, watch for fever (100.51F or greater), cough, shortness of breath, or other COVID-19 symptoms. If you develop symptoms, get tested immediately and isolate until you receive your test results. If you test positive, follow isolation recommendations. If you do not develop symptoms, get tested at least 5 days after you last had close contact with someone with COVID-19. If you test negative, you can leave your home, but continue to wear a well-fitting mask when around others at home and in public until 10 days after your last close contact with someone with COVID-19. If you test positive, you should isolate for at least 5 days from the date of your positive test (if you do not have symptoms). If you do develop COVID-19 symptoms, isolate for at least 5 days from the date your symptoms began (the date the symptoms started is day 0). Follow recommendations in the isolation section below. If you are unable to get a test 5 days after last close contact with someone with COVID-19, you can leave your home after day 5 if you have been without COVID-19 symptoms throughout the 5-day period. Wear a well-fitting mask for 10 days after your date of last close contact when around others at home and in public. Avoid people who are have weakened immune systems or are more likely to get very sick from COVID-19, and nursing homes and other high-risk settings, until after at least 10 days. If possible, stay away from people you live with, especially people who are at higher risk for getting very sick from COVID-19, as well as others outside your home throughout the full 10 days after your last close contact with someone with COVID-19. If you are unable to quarantine, you should wear a well-fitting mask for 10 days when around others at home and in public. If you are  unable to wear a mask when around others, you should continue to quarantine for 10 days. Avoid people who have weakened immune systems or are more likely to get very sick from COVID-19, and nursing homes and other high-risk settings, until after at least 10 days. See additional information about travel. Do not go to places where you are unable to wear a mask, such as restaurants and some gyms, and avoid eating around others at home and at work until after 10 days after your last close contact with someone with COVID-19. After quarantine Watch for symptoms until 10 days after your last close contact with someone with COVID-19. If you have symptoms, isolate immediately and get tested. Quarantine in high-risk congregate settings In certain congregate settings that have high risk of secondary transmission (such as Systems analyst and detention facilities, homeless shelters, or cruise ships), CDC recommends a 10-day quarantine for residents, regardless of vaccination and booster status. During periods of critical staffing shortages, facilities may consider shortening the quarantine period for staff to ensure continuity of operations. Decisions to shorten quarantine in these settings should be made in consultation with state,  local, tribal, or territorial health departments and should take into consideration the context and characteristics of the facility. CDC's setting-specific guidance provides additional recommendations for these settings. Isolation Isolation is used to separate people with confirmed or suspected COVID-19 from those without COVID-19. People who are in isolation should stay home until it's safe for them to be around others. At home, anyone sick or infected should separate from others, or wear a well-fitting mask when they need to be around others. People in isolation should stay in a specific "sick room" or area and use a separate bathroom if available. Everyone who has presumed or confirmed  COVID-19 should stay home and isolate from other people for at least 5 full days (day 0 is the first day of symptoms or the date of the day of the positive viral test for asymptomatic persons). They should wear a mask when around others at home and in public for an additional 5 days. People who are confirmed to have COVID-19 or are showing symptoms of COVID-19 need to isolate regardless of their vaccination status. This includes: People who have a positive viral test for COVID-19, regardless of whether or not they have symptoms. People with symptoms of COVID-19, including people who are awaiting test results or have not been tested. People with symptoms should isolate even if they do not know if they have been in close contact with someone with COVID-19. What to do for isolation Monitor your symptoms. If you have an emergency warning sign (including trouble breathing), seek emergency medical care immediately. Stay in a separate room from other household members, if possible. Use a separate bathroom, if possible. Take steps to improve ventilation at home, if possible. Avoid contact with other members of the household and pets. Don't share personal household items, like cups, towels, and utensils. Wear a well-fitting mask when you need to be around other people. Learn more about what to do if you are sick and how to notify your contacts. Ending isolation for people who had COVID-19 and had symptoms If you had COVID-19 and had symptoms, isolate for at least 5 days. To calculate your 5-day isolation period, day 0 is your first day of symptoms. Day 1 is the first full day after your symptoms developed. You can leave isolation after 5 full days. You can end isolation after 5 full days if you are fever-free for 24 hours without the use of fever-reducing medication and your other symptoms have improved (Loss of taste and smell may persist for weeks or months after recovery and need not delay the end of  isolation). You should continue to wear a well-fitting mask around others at home and in public for 5 additional days (day 6 through day 10) after the end of your 5-day isolation period. If you are unable to wear a mask when around others, you should continue to isolate for a full 10 days. Avoid people who have weakened immune systems or are more likely to get very sick from COVID-19, and nursing homes and other high-risk settings, until after at least 10 days. If you continue to have fever or your other symptoms have not improved after 5 days of isolation, you should wait to end your isolation until you are fever-free for 24 hours without the use of fever-reducing medication and your other symptoms have improved. Continue to wear a well-fitting mask through day 10. Contact your healthcare provider if you have questions. See additional information about travel. Do not go to places where you  are unable to wear a mask, such as restaurants and some gyms, and avoid eating around others at home and at work until a full 10 days after your first day of symptoms. If an individual has access to a test and wants to test, the best approach is to use an antigen test1 towards the end of the 5-day isolation period. Collect the test sample only if you are fever-free for 24 hours without the use of fever-reducing medication and your other symptoms have improved (loss of taste and smell may persist for weeks or months after recovery and need not delay the end of isolation). If your test result is positive, you should continue to isolate until day 10. If your test result is negative, you can end isolation, but continue to wear a well-fitting mask around others at home and in public until day 10. Follow additional recommendations for masking and avoiding travel as described above. 1As noted in the labeling for authorized over-the counter antigen tests: Negative results should be treated as presumptive. Negative results do not  rule out SARS-CoV-2 infection and should not be used as the sole basis for treatment or patient management decisions, including infection control decisions. To improve results, antigen tests should be used twice over a three-day period with at least 24 hours and no more than 48 hours between tests. Note that these recommendations on ending isolation do not apply to people who are moderately ill or very sick from COVID-19 or have weakened immune systems. See section below for recommendations for when to end isolation for these groups. Ending isolation for people who tested positive for COVID-19 but had no symptoms If you test positive for COVID-19 and never develop symptoms, isolate for at least 5 days. Day 0 is the day of your positive viral test (based on the date you were tested) and day 1 is the first full day after the specimen was collected for your positive test. You can leave isolation after 5 full days. If you continue to have no symptoms, you can end isolation after at least 5 days. You should continue to wear a well-fitting mask around others at home and in public until day 10 (day 6 through day 10). If you are unable to wear a mask when around others, you should continue to isolate for 10 days. Avoid people who have weakened immune systems or are more likely to get very sick from COVID-19, and nursing homes and other high-risk settings, until after at least 10 days. If you develop symptoms after testing positive, your 5-day isolation period should start over. Day 0 is your first day of symptoms. Follow the recommendations above for ending isolation for people who had COVID-19 and had symptoms. See additional information about travel. Do not go to places where you are unable to wear a mask, such as restaurants and some gyms, and avoid eating around others at home and at work until 10 days after the day of your positive test. If an individual has access to a test and wants to test, the best  approach is to use an antigen test1 towards the end of the 5-day isolation period. If your test result is positive, you should continue to isolate until day 10. If your test result is positive, you can also choose to test daily and if your test result is negative, you can end isolation, but continue to wear a well-fitting mask around others at home and in public until day 10. Follow additional recommendations for masking  and avoiding travel as described above. 1As noted in the labeling for authorized over-the counter antigen tests: Negative results should be treated as presumptive. Negative results do not rule out SARS-CoV-2 infection and should not be used as the sole basis for treatment or patient management decisions, including infection control decisions. To improve results, antigen tests should be used twice over a three-day period with at least 24 hours and no more than 48 hours between tests. Ending isolation for people who were moderately or very sick from COVID-19 or have a weakened immune system People who are moderately ill from COVID-19 (experiencing symptoms that affect the lungs like shortness of breath or difficulty breathing) should isolate for 10 days and follow all other isolation precautions. To calculate your 10-day isolation period, day 0 is your first day of symptoms. Day 1 is the first full day after your symptoms developed. If you are unsure if your symptoms are moderate, talk to a healthcare provider for further guidance. People who are very sick from COVID-19 (this means people who were hospitalized or required intensive care or ventilation support) and people who have weakened immune systems might need to isolate at home longer. They may also require testing with a viral test to determine when they can be around others. CDC recommends an isolation period of at least 10 and up to 20 days for people who were very sick from COVID-19 and for people with weakened immune systems. Consult  with your healthcare provider about when you can resume being around other people. If you are unsure if your symptoms are severe or if you have a weakened immune system, talk to a healthcare provider for further guidance. People who have a weakened immune system should talk to their healthcare provider about the potential for reduced immune responses to COVID-19 vaccines and the need to continue to follow current prevention measures (including wearing a well-fitting mask and avoiding crowds and poorly ventilated indoor spaces) to protect themselves against COVID-19 until advised otherwise by their healthcare provider. Close contacts of immunocompromised people--including household members--should also be encouraged to receive all recommended COVID-19 vaccine doses to help protect these people. Isolation in high-risk congregate settings In certain high-risk congregate settings that have high risk of secondary transmission and where it is not feasible to cohort people (such as Systems analyst and detention facilities, homeless shelters, and cruise ships), CDC recommends a 10-day isolation period for residents. During periods of critical staffing shortages, facilities may consider shortening the isolation period for staff to ensure continuity of operations. Decisions to shorten isolation in these settings should be made in consultation with state, local, tribal, or territorial health departments and should take into consideration the context and characteristics of the facility. CDC's setting-specific guidance provides additional recommendations for these settings. This CDC guidance is meant to supplement--not replace--any federal, state, local, territorial, or tribal health and safety laws, rules, and regulations. Recommendations for specific settings These recommendations do not apply to healthcare professionals. For guidance specific to these settings, see Healthcare professionals: Interim Guidance for Clinical cytogeneticist with SARS-CoV-2 Infection or Exposure to SARS-CoV-2 Patients, residents, and visitors to healthcare settings: Interim Infection Prevention and Control Recommendations for Healthcare Personnel During the [place] 2019 (COVID-19) Pandemic Additional setting-specific guidance and recommendations are available. These recommendations on quarantine and isolation do apply to [place] settings. Additional guidance is available here: Overview of COVID-19 Quarantine for [employer] Travelers: Travel information and recommendations Congregate facilities and other settings: [employer] for community, work, and  school settings Ongoing COVID-19 exposure FAQs I live with someone with COVID-19, but I cannot be separated from them. How do we manage quarantine in this situation? It is very important for people with COVID-19 to remain apart from other people, if possible, even if they are living together. If separation of the person with COVID-19 from others that they live with is not possible, the other people that they live with will have ongoing exposure, meaning they will be repeatedly exposed until that person is no longer able to spread the virus to other people. In this situation, there are precautions you can take to limit the spread of COVID-19: The person with COVID-19 and everyone they live with should wear a well-fitting mask inside the home. If possible, one person should care for the person with COVID-19 to limit the number of people who are in close contact with the infected person. Take steps to protect yourself and others to reduce transmission in the home: Quarantine if you are not up to date with your COVID-19 vaccines. Isolate if you are sick or tested positive for COVID-19, even if you don't have symptoms. Learn more about the public health recommendations for testing, mask use and quarantine of close contacts, like yourself, who have ongoing exposure. These  recommendations differ depending on your vaccination status. What should I do if I have ongoing exposure to COVID-19 from someone I live with? Recommendations for this situation depend on your vaccination status: If you are not up to date on COVID-19 vaccines and have ongoing exposure to COVID-19, you should: Begin quarantine immediately and continue to quarantine throughout the isolation period of the person with COVID-19. Continue to quarantine for an additional 5 days starting the day after the end of isolation for the person with COVID-19. Get tested at least 5 days after the end of isolation of the infected person that lives with them. If you test negative, you can leave the home but should continue to wear a well-fitting mask when around others at home and in public until 10 days after the end of isolation for the person with COVID-19. Isolate immediately if you develop symptoms of COVID-19 or test positive. If you are up to date with COVID-19 vaccines and have ongoing exposure to COVID-19, you should: Get tested at least 5 days after your first exposure. A person with COVID-19 is considered infectious starting 2 days before they develop symptoms, or 2 days before the date of their positive test if they do not have symptoms. Get tested again at least 5 days after the end of isolation for the person with COVID-19. Wear a well-fitting mask when you are around the person with COVID-19, and do this throughout their isolation period. Wear a well-fitting mask around others for 10 days after the infected person's isolation period ends. Isolate immediately if you develop symptoms of COVID-19 or test positive. What should I do if multiple people I live with test positive for COVID-19 at different times? Recommendations for this situation depend on your vaccination status: If you are not up to date with your COVID-19 vaccines, you should: Quarantine throughout the isolation period of any infected  person that you live with. Continue to quarantine until 5 days after the end of isolation date for the most recently infected person that lives with you. For example, if the last day of isolation of the person most recently infected with COVID-19 was June 30, the new 5-day quarantine period starts on July 1. Get tested at  least 5 days after the end of isolation for the most recently infected person that lives with you. Wear a well-fitting mask when you are around any person with COVID-19 while that person is in isolation. Wear a well-fitting mask when you are around other people until 10 days after your last close contact. Isolate immediately if you develop symptoms of COVID-19 or test positive. If you are up to date with your COVID-19 vaccines, you should: Get tested at least 5 days after your first exposure. A person with COVID-19 is considered infectious starting 2 days before they developed symptoms, or 2 days before the date of their positive test if they do not have symptoms. Get tested again at least 5 days after the end of isolation for the most recently infected person that lives with you. Wear a well-fitting mask when you are around any person with COVID-19 while that person is in isolation. Wear a well-fitting mask around others for 10 days after the end of isolation for the most recently infected person that lives with you. For example, if the last day of isolation for the person most recently infected with COVID-19 was June 30, the new 10-day period to wear a well-fitting mask indoors in public starts on July 1. Isolate immediately if you develop symptoms of COVID-19 or test positive. I had COVID-19 and completed isolation. Do I have to quarantine or get tested if someone I live with gets COVID-19 shortly after I completed isolation? No. If you recently completed isolation and someone that lives with you tests positive for the virus that causes COVID-19 shortly after the end of your  isolation period, you do not have to quarantine or get tested as long as you do not develop new symptoms. Once all of the people that live together have completed isolation or quarantine, refer to the guidance below for new exposures to COVID-19. If you had COVID-19 in the previous 90 days and then came into close contact with someone with COVID-19, you do not have to quarantine or get tested if you do not have symptoms. But you should: Wear a well-fitting mask indoors in public for 10 days after your last close contact. Monitor for COVID-19 symptoms for 10 days from the date of your last close contact. Isolate immediately and get tested if symptoms develop. If more than 90 days have passed since your recovery from infection, follow CDC's recommendations for close contacts. These recommendations will differ depending on your vaccination status. 05/30/2020 Content source: [facility] for [facility]ization and Respiratory Diseases ([facility]), Division of Viral Diseases This information is not intended to replace advice given to you by your health care provider. Make sure you discuss any questions you have with your health care provider. Document Revised: 10/03/2020 Document Reviewed: 10/03/2020 Elsevier Patient Education  2022 [employer].    If you have been instructed to have an in-person evaluation today at a local Urgent Care facility, please use the link below. It will take you to a list of all of our available [facility], including address, phone number and hours of operation. Please do not delay care.  [facility]  If you or a family member do not have a primary care provider, use the link below to schedule a visit and establish care. When you choose a [facility] primary care physician or advanced practice provider, you gain a long-term partner in health. Find a Primary Care Provider  Learn more about 's in-office and virtual  care options: [facility]

## 2021-10-01 NOTE — Progress Notes (Addendum)
Virtual Visit Consent   Jesse Bowers, you are scheduled for a virtual visit with a [facility] provider today. Just as with appointments in the office, your consent must be obtained to participate. Your consent will be active for this visit and any virtual visit you may have with one of our providers in the next 365 days. If you have a MyChart account, a copy of this consent can be sent to you electronically.  As this is a virtual visit, video technology does not allow for your provider to perform a traditional examination. This may limit your provider's ability to fully assess your condition. If your provider identifies any concerns that need to be evaluated in person or the need to arrange testing (such as labs, EKG, etc.), we will make arrangements to do so. Although advances in technology are sophisticated, we cannot ensure that it will always work on either your end or our end. If the connection with a video visit is poor, the visit may have to be switched to a telephone visit. With either a video or telephone visit, we are not always able to ensure that we have a secure connection.  By engaging in this virtual visit, you consent to the provision of healthcare and authorize for your insurance to be billed (if applicable) for the services provided during this visit. Depending on your insurance coverage, you may receive a charge related to this service.  I need to obtain your verbal consent now. Are you willing to proceed with your visit today? Jesse Bowers has provided verbal consent on 10/01/2021 for a virtual visit (video or telephone). Jesse Daring, PA-C  Date: 10/01/2021 8:52 AM  Virtual Visit via Video Note   I, Jesse Bowers, connected with  Jesse Bowers  ([id number], 1965/05/21) on 10/01/21 at  8:45 AM EDT by a video-enabled telemedicine application and verified that I am speaking with the correct person using two identifiers.  Location: Patient: Virtual Visit Location Patient:  Home Provider: Virtual Visit Location Provider: Home Office   I discussed the limitations of evaluation and management by telemedicine and the availability of in person appointments. The patient expressed understanding and agreed to proceed.    History of Present Illness: Jesse Bowers is a 56 y.o. who identifies as a male who was assigned adult at birth, and is being seen today for Covid 19.  HPI: URI  This is a new problem. The current episode started in the past 7 days (symptoms started 09/29/21, tested positive 09/30/21; after traveling over the weekend to [facility] for a wedding). The problem has been gradually worsening. The maximum temperature recorded prior to his arrival was 102 - 102.9 F (102.7 (addend: this temp was in scrotal area, temporal temp was 99.0)). The fever has been present for Less than 1 day. Associated symptoms include congestion, coughing, headaches, rhinorrhea and a sore throat. Pertinent negatives include no diarrhea, ear pain, nausea, plugged ear sensation, sinus pain or vomiting. He has tried acetaminophen for the symptoms. The treatment provided no relief.    Has had 4 vaccines  Problems:  Patient Active Problem List   Diagnosis Date Noted   Spondylosis of lumbar spine 10/03/2020   Pseudophakia of both eyes 05/07/2020   Retinal telangiectasis of both eyes 05/07/2020   Lattice degeneration of peripheral retina, left 09/08/2019   Lattice degeneration, right eye 09/08/2019   Chronic diastolic heart failure ([place]) 05/24/2019   Urinary dysfunction 04/12/2019   Osteoarthritis of right knee 08/23/2018   Constipation due  to opioid therapy 03/30/2018   SNHL (sensorineural hearing loss) 12/04/2017   Abnormal urinary stream 12/03/2017   Osteoarthritis of carpometacarpal (CMC) joint of thumb 11/30/2017   Muscle weakness 11/17/2017   Transient vision disturbance 11/12/2017   Bilateral hand pain 10/30/2017   Pain of left hip joint 10/09/2017   Gynecomastia 07/10/2017   Iron  deficiency anemia 07/05/2017   Spasticity 05/20/2017   Lumbar radiculitis 04/20/2017   Ulnar 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, left 11/28/2016   Idiopathic peripheral neuropathy 11/28/2016   Failed total knee arthroplasty, 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   Long term (current) use of anticoagulants 08/23/2015   Right upper quadrant abdominal pain 08/23/2015   Memory loss 05/10/2015   Gait abnormality 04/07/2015   Alkaline phosphatase elevation 04/07/2015   History of thrombosis 03/26/2015   Medial epicondylitis 02/07/2015   Cognitive decline 12/21/2014   Leukopenia 12/05/2014   Rotator cuff syndrome of right shoulder 10/27/2014   Status post left knee replacement 08/22/2014   Left lateral epicondylitis 08/22/2014   Chronic cerebral ischemia 08/18/2014   Arthrofibrosis of knee joint 08/17/2014   Cubital canal compression syndrome, left 08/17/2014   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 Pt. States, " It makes me crazy, I can't take this medicine." Other reaction(s): Other (See Comments) Tardive dyskinesia   Penicillin G Anaphylaxis and Other (See Comments)    Has patient had a PCN reaction causing immediate rash, facial/tongue/throat swelling, SOB or lightheadedness with hypotension: Yes Has patient had a PCN reaction causing severe rash involving mucus membranes or skin necrosis: No Has patient had a PCN reaction that required hospitalization Yes Has patient had a PCN reaction occurring within the last 10 years: No If all of the above answers are "NO", then may proceed with Cephalosporin use.    Sulfa Antibiotics Rash    Stevens-Johnson rash   Nortriptyline Other (See Comments)    Dry mouth at 25 mg dose.  Tolerates 10 mg dose Other reaction(s): Other (See Comments) Dry Mouth   Pregabalin Other (See Comments)    Ineffective Other reaction(s): Other (See Comments) Tardive dyskinesia   Tegaderm Ag Mesh [Silver] Dermatitis    Causes blistering wounds    Ibuprofen Other (See Comments)    Contraindicated  with Xarelto.    Sulfacetamide Sodium-Sulfur Rash   Medications:  Current Outpatient Medications:    molnupiravir EUA (LAGEVRIO) 200 mg CAPS capsule, Take 4 capsules (800 mg total) by mouth 2 (two) times daily for 5 days., Disp: 40 capsule, Rfl: 0   acyclovir ointment (ZOVIRAX) 5 %, as needed., Disp: , Rfl:    baclofen (LIORESAL) 20 MG tablet, Take 1-2 tablets (20-40 mg total) by mouth 4 (four) times daily. 1 tablet with breakfast, lunch, and dinner and 2 tablets at bedtime, Disp: 450 each, Rfl: 2   Calcipotriene-Betameth  Diprop 0.005-0.064 % FOAM, Apply 1 application. topically 2 (two) times daily as needed., Disp: , Rfl:    Calcium Carbonate Antacid (TUMS ULTRA 1000 PO), Take by mouth daily., Disp: , Rfl:    ciprofloxacin-dexamethasone (CIPRODEX) OTIC suspension, , Disp: , Rfl:    Clobetasol Propionate Emulsion 0.05 % topical foam, daily., Disp: , Rfl:    Clobetasol Propionate Emulsion 0.05 % topical foam, Apply a thin layer to the affected area(s) on the scalp, Disp: 100 g, Rfl: 5   clorazepate (TRANXENE) 15 MG tablet, Take 15 mg by mouth at bedtime., Disp: , Rfl:    clorazepate (TRANXENE) 15 MG tablet, Take 1 tab at night, Disp: 90 tablet, Rfl: 1   clorazepate (TRANXENE) 7.5 MG tablet, Take 7.5 mg by mouth 2 (two) times daily., Disp: , Rfl:    clorazepate (TRANXENE) 7.5 MG tablet, Take 1 tab 2 times a day (also takes 7m at night), Disp: 180 tablet, Rfl: 1   desonide (DESOWEN) 0.05 % cream, Apply topically 2 (two) times daily., Disp: , Rfl:    desonide (DESOWEN) 0.05 % cream, 1(one) application(s) topical 2(two) times a day, Disp: 60 g, Rfl: 2   desvenlafaxine (PRISTIQ) 100 MG 24 hr tablet, Take 100 mg by mouth daily., Disp: , Rfl:    desvenlafaxine (PRISTIQ) 100 MG 24 hr tablet, Take 1 tab daily,, Disp: 90 tablet, Rfl: 1   ergocalciferol (VITAMIN D2) 1.25 MG (50000 UT) capsule, Take 1 capsule by mouth once a week., Disp: , Rfl:    FIBER SELECT GUMMIES PO, Take by mouth daily., Disp: , Rfl:    folic acid (FOLVITE) 1 MG tablet, Take 1 tablet by mouth every day., Disp: 90 tablet, Rfl: 1   furosemide (LASIX) 20 MG tablet, Take 1 tablet (20 mg total) by mouth daily as needed for edema., Disp: 30 tablet, Rfl: 1   furosemide (LASIX) 80 MG tablet, Take 1 tablet (80 mg total) by mouth daily., Disp: 90 tablet, Rfl: 3   halobetasol (ULTRAVATE) 0.05 % cream, Apply topically., Disp: , Rfl:    halobetasol (ULTRAVATE) 0.05 % cream, Apply to affected area twice daily to hands, feet and knees., Disp: 50 g, Rfl: 1    linaclotide (LINZESS) 290 MCG CAPS capsule, Take 1 capsule by mouth daily before breakfast., Disp: 30 capsule, Rfl: 8   linaclotide (LINZESS) 290 MCG CAPS capsule, Take 1 capsule by mouth once daily., Disp: 90 capsule, Rfl: 0   methotrexate (RHEUMATREX) 2.5 MG tablet, Take 15 mg by mouth once a week. Caution:Chemotherapy. Protect from light., Disp: , Rfl:    methotrexate (RHEUMATREX) 2.5 MG tablet, Take 6.0 tablets po once weekly, Disp: 48 tablet, Rfl: 0   metoprolol tartrate (LOPRESSOR) 25 MG tablet, Take 1 tablet (25 mg total) by mouth 2 (two) times daily as needed., Disp: 30 tablet, Rfl: 3   morphine (MS CONTIN) 15 MG 12 hr tablet, Take 1 tablet (15 mg total) by  mouth every 12 (twelve) hours., Disp: 60 tablet, Rfl: 0   morphine (MS CONTIN) 30 MG 12 hr tablet, Take 1 tablet (30 mg total) by mouth every 12 (twelve) hours., Disp: 60 tablet, Rfl: 0   Multiple Vitamins-Minerals (ALIVE MULTI-VITAMIN) CHEW, , Disp: , Rfl:    naloxegol oxalate (MOVANTIK) 25 MG TABS tablet, Take 1 tablet (25 mg total) by mouth daily., Disp: 30 tablet, Rfl: 2   Oxycodone HCl 10 MG TABS, Take 1 tablet (10 mg total) by mouth every 8 (eight) hours as needed (pain)., Disp: 85 tablet, Rfl: 0   polyethylene glycol powder (GLYCOLAX/MIRALAX) 17 GM/SCOOP powder, Purelax 17 gram/dose oral powder  TAKE 1 CAPFUL MIXED INTO 8 OZ OF CLEAR LIQUID UP TO FOUR TIMES A DAY AS NEEDED, Disp: , Rfl:    potassium chloride (KLOR-CON) 10 MEQ tablet, Take by mouth., Disp: , Rfl:    potassium chloride (KLOR-CON) 10 MEQ tablet, Take 2 tablets by mouth every day., Disp: 180 tablet, Rfl: 3   RESTASIS 0.05 % ophthalmic emulsion, 1 drop 2 (two) times daily., Disp: , Rfl:    rivaroxaban (XARELTO) 20 MG TABS tablet, Take 1 tablet (20 mg total) by mouth nightly., Disp: 90 tablet, Rfl: 0   Secukinumab (COSENTYX SENSOREADY PEN) 150 MG/ML SOAJ, Inject 2 pens subcutaneously every 4 weeks, Disp: 2 mL, Rfl: 9   Tenapanor HCl (IBSRELA) 50 MG TABS, Take 1 tablet (50  mg total) by mouth 2 times daily., Disp: 180 tablet, Rfl: 1   testosterone cypionate (DEPOTESTOSTERONE CYPIONATE) 200 MG/ML injection, INJECT 0.5 MLS (100 MG TOTAL) INTO THE MUSCLE ONCE A WEEK., Disp: 10 mL, Rfl: 0   ursodiol (ACTIGALL) 250 MG tablet, Take 1 tablet by mouth at lunch and dinner in addition to ursodiol 500 mg for a total 750 mg dose., Disp: 60 tablet, Rfl: 7   ursodiol (ACTIGALL) 500 MG tablet, Take 1 tablet (500 mg) by mouth at lunch and dinner in addition to ursodiol 250 mg for a total 750 mg dose., Disp: 60 tablet, Rfl: 11   Vitamin D, Ergocalciferol, (DRISDOL) 1.25 MG (50000 UNIT) CAPS capsule, Take 1 capsule by mouth once weekly., Disp: 12 capsule, Rfl: 0   XARELTO 20 MG TABS tablet, TAKE 1 TABLET(20 MG) BY MOUTH EVERY NIGHT AT BEDTIME, Disp: 30 tablet, Rfl: 11  Observations/Objective: Patient is well-developed, well-nourished in no acute distress.  Resting comfortably at home.  Head is normocephalic, atraumatic.  No labored breathing.  Speech is clear and coherent with logical content.  Patient is alert and oriented at baseline.    Assessment and Plan: 1. COVID-19 - molnupiravir EUA (LAGEVRIO) 200 mg CAPS capsule; Take 4 capsules (800 mg total) by mouth 2 (two) times daily for 5 days.  Dispense: 40 capsule; Refill: 0  - Continue OTC symptomatic management of choice - Will send OTC vitamins and supplement information through AVS - Molnupiravir prescribed - Patient enrolled in MyChart symptom monitoring - Push fluids - Rest as needed - Discussed return precautions and when to seek in-person evaluation, sent via [facility] as well   Follow Up Instructions: I discussed the assessment and treatment plan with the patient. The patient was provided an opportunity to ask questions and all were answered. The patient agreed with the plan and demonstrated an understanding of the instructions.  A copy of instructions were sent to the patient via MyChart unless otherwise noted below.     The patient was advised to call back or seek an in-person evaluation if the symptoms  worsen or if the condition fails to improve as anticipated.  Time:  I spent 12 minutes with the patient via telehealth technology discussing the above problems/concerns.    Jesse Daring, PA-C

## 2021-10-02 ENCOUNTER — Encounter: Payer: Self-pay | Admitting: Physical Medicine & Rehabilitation

## 2021-10-02 ENCOUNTER — Other Ambulatory Visit (HOSPITAL_BASED_OUTPATIENT_CLINIC_OR_DEPARTMENT_OTHER): Payer: Self-pay

## 2021-10-02 ENCOUNTER — Other Ambulatory Visit (HOSPITAL_COMMUNITY): Payer: Self-pay

## 2021-10-02 ENCOUNTER — Encounter: Payer: 59 | Attending: Physical Medicine & Rehabilitation | Admitting: Physical Medicine & Rehabilitation

## 2021-10-02 VITALS — BP 101/78 | HR 113 | Temp 98.1°F | Ht 69.0 in | Wt 209.0 lb

## 2021-10-02 DIAGNOSIS — M47816 Spondylosis without myelopathy or radiculopathy, lumbar region: Secondary | ICD-10-CM | POA: Diagnosis not present

## 2021-10-02 DIAGNOSIS — G950 Syringomyelia and syringobulbia: Secondary | ICD-10-CM | POA: Insufficient documentation

## 2021-10-02 DIAGNOSIS — L4050 Arthropathic psoriasis, unspecified: Secondary | ICD-10-CM | POA: Diagnosis not present

## 2021-10-02 MED ORDER — OXYCODONE HCL 10 MG PO TABS
10.0000 mg | ORAL_TABLET | Freq: Three times a day (TID) | ORAL | 0 refills | Status: DC | PRN
  Filled 2021-10-02: qty 70, 24d supply, fill #0

## 2021-10-02 MED ORDER — MORPHINE SULFATE ER 30 MG PO TBCR
30.0000 mg | EXTENDED_RELEASE_TABLET | Freq: Two times a day (BID) | ORAL | 0 refills | Status: DC
  Filled 2021-10-02 – 2021-10-08 (×2): qty 60, 30d supply, fill #0

## 2021-10-02 NOTE — Addendum Note (Signed)
Addended by: Alger Simons T on: 10/02/2021 04:52 PM   Modules accepted: Level of Service

## 2021-10-02 NOTE — Progress Notes (Signed)
Subjective:    Patient ID: Jesse Bowers       DOB: 13-May-1965, 56 y.o.   MRN: [id number] The patient has consented to a tele-health/telephone encounter with [facility]. The patient is at home and the provider is at the office. Two separate patient identifiers were utilized to confirm the identity of the patient.  We discussed the limitations of evaluation and management by telemedicine and the availability of in person appointments. The patient expressed understanding and agreed to proceed.   Jesse Bowers is here in follow up of his chronic pain and functional mobility issues. He came down with COVID, hence, why we are doing things remotely today.   He has some concerns about increased weakness in his upper arms. He is also noticing a "cape-like" numbness along his upper back and chest. He also reports decreased cervical ROM with rotation and lateral bending.  It's been progressively increasing for about 6 months to the point where the symptoms are continuous.   Most recent MRI is from 2019 which demonstrated: IMPRESSION: 1. 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 2. No acute findings in the cervical spine. Mild for age spine degeneration with no spinal stenosis. Chronic left C4 foraminal stenosis.  He has a scrotal repair procedure coming up. This area has begun to be less painful, fortunately.   He describes ongoing pain and numbness in his legs and is interested in treatment for peripheral neuropathy in his legs.   Due to availability issues we had to change his long acting opiate to MS COntin. He is using 49m q12. He is no longer taking oxycodone and notices the difference. His rheum meds, including MTX, are helping his arthritic related pain.     HPI Pain Inventory Average Pain 6 Pain Right Now 7 My pain is sharp, dull, and aching  In the last 24 hours, has pain  interfered with the following? General activity 6 Relation with others 6 Enjoyment of life 6 What TIME of day is your pain at its worst? varies Sleep (in general) Poor  Pain is worse with: walking, sitting, standing, and some activites Pain improves with: medication Relief from Meds: 6  Family History  Problem Relation Age of Onset   Stroke Maternal Grandfather        566  Heart attack Maternal Grandfather    Glaucoma Maternal Grandfather    Macular degeneration Maternal Grandfather    Breast cancer Sister    Hypertension Mother    Psoriasis Mother    Other Mother        meningioma developed ~2019   Glaucoma Mother    Cancer Paternal Grandfather    Heart attack Paternal Grandfather    Stroke Paternal Uncle        age 56  Polycythemia Paternal Uncle    Stroke Maternal Grandmother    Congestive Heart Failure Maternal Grandmother    Heart attack Maternal Grandmother    Protein C deficiency Sister 379      Miscarriages   Breast cancer Maternal Aunt 38  Social History   Socioeconomic History   Marital status: Married    Spouse name: Not on file   Number of children: 2   Years of education: 4y college   Highest education level: Not on file  Occupational History   Occupation: Pediatric Nurse practitioner    Comment: Not working since CVA  2015  Tobacco Use   Smoking status: Never   Smokeless tobacco: Never  Vaping Use   Vaping Use: Never used  Substance and Sexual Activity   Alcohol use: Yes    Comment: social   Drug use: No   Sexual activity: Yes    Birth control/protection: None    Comment: patient is a transgender on testosterone shots, no biological kids  Other Topics Concern   Not on file  Social History Narrative   Education 4 year college, former Therapist, sports X 15 years, pediatric nurse practitioner x 6 years, did NP degree from [employer] [place]. Relocated to [place] about 2 months ago from [place], [place]. Patient was in [place] for last 4 years and prior to  that in [place]. His wife is working as Scientist, research (physical sciences) for [employer]. Patient is not working and applying for disability. They have 2 kids but no biologic children.    Social Determinants of Health   Financial Resource Strain: Not on file  Food Insecurity: Not on file  Transportation Needs: Not on file  Physical Activity: Not on file  Stress: Not on file  Social Connections: Not on file   Past Surgical History:  Procedure Laterality Date   ABDOMINAL HYSTERECTOMY Bilateral 1994   TAH, BSO- tranverse incision at 56 yo   ANKLE ARTHROSCOPY WITH RECONSTRUCTION Right 2007   CHOLECYSTECTOMY     laparoscopic   COLONOSCOPY     x3   EYE SURGERY     Left eye 03/02/2018, right 02/15/2018   HIP ARTHROSCOPY W/ LABRAL REPAIR Right 05/11/2013   acetabular labral tear 03/30/2013   KNEE ARTHROPLASTY Right    KNEE JOINT MANIPULATION Left    x3 under anesthesia   KNEE SURGERY Bilateral 1984   Right ACL, left PCL repair   LITHOTRIPSY  2005   LIVER BIOPSY  2013   normal results.   MASTECTOMY Bilateral    prior to 2009   MOUTH SURGERY     NASAL SEPTUM SURGERY N/A 09/20/2015   by ENT Dr. [name]   OVARIAN CYST SURGERY Left    size of grapefruit, was informed that she had shortened vagina   SHOULDER SURGERY Bilateral    Right 08/15/2016, Left 11/15/2016   THUMB ARTHROSCOPY Left    THYROIDECTOMY, PARTIAL Left 2008   TOTAL KNEE ARTHROPLASTY Right 08/23/2018   Procedure: TOTAL KNEE ARTHROPLASTY;  Surgeon: [name], MD;  Location: [facility];  Service: Orthopedics;  Laterality: Right;  26mn   TOTAL KNEE REVISION Left 02/06/2016   Procedure: LEFT TOTAL KNEE REVISION;  Surgeon: FGaynelle Arabian MD;  Location: [facility];  Service: Orthopedics;  Laterality: Left;   TOTAL KNEE REVISION Left 04/22/2017   Procedure: Left knee polyethylene revision;  Surgeon: AGaynelle Arabian MD;  Location: [facility];  Service: Orthopedics;  Laterality: Left;   UPPER GI ENDOSCOPY  2003   Past Surgical History:  Procedure  Laterality Date   ABDOMINAL HYSTERECTOMY Bilateral 1994   TAH, BSO- tranverse incision at 56yo   ANKLE ARTHROSCOPY WITH RECONSTRUCTION Right 2007   CHOLECYSTECTOMY     laparoscopic   COLONOSCOPY     x3   EYE SURGERY     Left eye 03/02/2018, right 02/15/2018   HIP ARTHROSCOPY W/ LABRAL REPAIR Right 05/11/2013   acetabular labral tear 03/30/2013   KNEE ARTHROPLASTY Right    KNEE JOINT MANIPULATION Left    x3 under anesthesia   KNEE SURGERY Bilateral 1984   Right ACL, left PCL repair   LITHOTRIPSY  2005   LIVER BIOPSY  2013   normal results.   MASTECTOMY Bilateral    prior to 2009   MOUTH SURGERY     NASAL SEPTUM SURGERY N/A 09/20/2015   by ENT Dr. [name]   OVARIAN CYST SURGERY Left    size of grapefruit, was informed that she had shortened vagina   SHOULDER SURGERY Bilateral    Right 08/15/2016, Left 11/15/2016   THUMB ARTHROSCOPY Left    THYROIDECTOMY, PARTIAL Left 2008   TOTAL KNEE ARTHROPLASTY Right 08/23/2018   Procedure: TOTAL KNEE ARTHROPLASTY;  Surgeon: [name], MD;  Location: [facility];  Service: Orthopedics;  Laterality: Right;  18mn   TOTAL KNEE REVISION Left 02/06/2016   Procedure: LEFT TOTAL KNEE REVISION;  Surgeon: FGaynelle Arabian MD;  Location: [facility];  Service: Orthopedics;  Laterality: Left;   TOTAL KNEE REVISION Left 04/22/2017   Procedure: Left knee polyethylene revision;  Surgeon: AGaynelle Arabian MD;  Location: [facility];  Service: Orthopedics;  Laterality: Left;   UPPER GI ENDOSCOPY  2003   Past Medical History:  Diagnosis Date   Abnormal weight loss    Anxiety    Arthritis    Cataract    OU   Celiac disease    Cervical neck pain with evidence of disc disease    patient has a cyst    Chronic constipation    Chronic diastolic heart failure (HCC)    Pt. denies   Chronic pain    Degenerative disc disease at L5-S1 level    with stenosis   DVT (deep venous thrombosis) (HCC)    Right upper arm, bilateral leg   Eczema    inguinal, feet   Elevated  liver enzymes    Failed total knee arthroplasty ([facility] 04/22/2017   Family history of adverse reaction to anesthesia    family has problems with anesthesia of nausea and vomiting    GERD (gastroesophageal reflux disease)    History of in 20's   Gluten enteropathy    H/O parotitis    right    Hard of hearing    History of kidney stones    History of retinal tear    Bilateral   History of staph infection    required wound vac   Hx-TIA (transient ischemic attack)    2015   Kidney stones 08/2020   LVH (left ventricular hypertrophy) 12/15/2016   Mild, noted on ECHO   MVP (mitral valve prolapse)    NAFL (nonalcoholic fatty liver)    Neck pain    Neuromuscular disorder ([facility]    bilateral neuropathy feet.   Nuclear sclerotic cataract of both eyes 09/08/2019   Pneumonia 12/17/2010   Polycythemia    Polycythemia, secondary    PONV (postoperative nausea and vomiting)    Protein C deficiency (HCC)    Dr. FAnne Fu  Psoriasis    16 X10 cm psoriatic rash on sole of left foot ; open and occ scant bleeding;    psoriatic arthritis    PTSD (post-traumatic stress disorder)    Scaphoid fracture of wrist 09/23/2013   Seizure ([facility]    childhood, medication until age [age]then weanned completely off   Sleep apnea    split night study last done by Dr. SFelecia Shelling4/17/17 shows severe OSA, CSA, and hypersomnia, rec bipap   Splenomegaly    Stenosis of ureteropelvic junction (UPJ)    left   Stroke ([facility]    CVA vs TIA in left cerebrum causing slight right  sided weakness-Dr. 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    Wears glasses    Wears hearing aid    BP 101/78 Comment: pt reported, virtual visit  Pulse (!) 113 Comment: pt reported, virtual visit  Temp 98.1 F (36.7 C) Comment: pt reported,  virtual visit  Ht 5' 9"  (1.753 m)   Wt 209 lb (94.8 kg)   SpO2 95% Comment: pt reported, virtual visit  BMI 30.86 kg/m   Opioid Risk Score:   Fall Risk Score:  `1  Depression screen PHQ 2/9     10/02/2021    1:51 PM 06/04/2021    1:13 PM 04/03/2021    1:01 PM 01/09/2021    1:09 PM 10/03/2020    3:14 PM 03/21/2020    2:46 PM 02/02/2018   10:32 AM  Depression screen PHQ 2/9  Decreased Interest 0 0 1 0 0 0 0  Down, Depressed, Hopeless 0 0 1 0 0 0 0  PHQ - 2 Score 0 0 2 0 0 0 0      Review of Systems  Musculoskeletal:  Positive for back pain.       Achy all over  Neurological:  Positive for weakness and numbness.  All other systems reviewed and are negative.     Objective:   Physical Exam N/a       Assessment & Plan:  1. Psoriatic arthritis with pain in multiple areas, most prominently feet, hands, elbows. His pain is also related to his prior CVA and associated motor/sensory change   2. 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 3. Hx of left total knee revision again on 04/21/17 per [facility] and right TKA 08/2018 4. Chronic  low back pain---MRI with severe DDD at L5-S1. Left S1 radiculopathy?  -s/p ESI 12/21/20  5. Protein C deficiency   6. Left shoulder subluxation, bicipital tendonitis 7. Central sleep apnea 8. Polycythemia 9. Depression with anxiety.  0. Left lateral epidondylitis  11. 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  now having right and left sided weakness with increased sensory loss in C4-6 dermatomes.. Most recent cervical MRI (07/08/17) without significant change 12. 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 [place] 14. Right wrist psoriatic arthritis, tendonapthy, ganglion cysts s/p multiple recent surgeries.  s/p right thumb replacement 15. Recent gender reconstruction surgery     Plan:  1. Continue venlafaxine per  pyschiatry 2. MTX, etc per rheum 3. Voltaren gel to hands, feet, knees, elbows--- continue 4. Baclofen for lower extermity spasms/back pain. increase to 67m 5x daily, taking 22mtid and 4048mt bedtime 5. Decrease Ms Contin to 57m27m #60 and resume oxycodone 10mg68m prn #70              -plan will ultimately to move his oxycodone to #60   6. MRI of cervical spine to assess C4-6          7. fair result with latest ESI        -consider f/u ESI at some point 8: OI constipation  -linzess  9. ?Qutenza candidate for ?peripheral neuropathy. Would like to reassess him at  next visit   15 minutes of teletime was spent during this visit. All questions were encouraged and answered.  Follow up with me in 2 mos .

## 2021-10-02 NOTE — Patient Instructions (Signed)
PLEASE FEEL FREE TO CALL OUR OFFICE WITH ANY PROBLEMS OR QUESTIONS ([phone])      

## 2021-10-03 ENCOUNTER — Other Ambulatory Visit (HOSPITAL_BASED_OUTPATIENT_CLINIC_OR_DEPARTMENT_OTHER): Payer: Self-pay

## 2021-10-04 ENCOUNTER — Other Ambulatory Visit (HOSPITAL_BASED_OUTPATIENT_CLINIC_OR_DEPARTMENT_OTHER): Payer: Self-pay

## 2021-10-07 ENCOUNTER — Other Ambulatory Visit (HOSPITAL_BASED_OUTPATIENT_CLINIC_OR_DEPARTMENT_OTHER): Payer: Self-pay

## 2021-10-08 ENCOUNTER — Other Ambulatory Visit (HOSPITAL_BASED_OUTPATIENT_CLINIC_OR_DEPARTMENT_OTHER): Payer: Self-pay

## 2021-10-08 ENCOUNTER — Encounter (INDEPENDENT_AMBULATORY_CARE_PROVIDER_SITE_OTHER): Payer: BC Managed Care – PPO | Admitting: Ophthalmology

## 2021-10-08 ENCOUNTER — Other Ambulatory Visit (HOSPITAL_COMMUNITY): Payer: Self-pay

## 2021-10-09 ENCOUNTER — Other Ambulatory Visit (HOSPITAL_COMMUNITY): Payer: Self-pay

## 2021-10-09 ENCOUNTER — Other Ambulatory Visit (HOSPITAL_BASED_OUTPATIENT_CLINIC_OR_DEPARTMENT_OTHER): Payer: Self-pay

## 2021-10-11 ENCOUNTER — Telehealth: Payer: 59 | Admitting: Physician Assistant

## 2021-10-11 ENCOUNTER — Ambulatory Visit (INDEPENDENT_AMBULATORY_CARE_PROVIDER_SITE_OTHER): Payer: 59 | Admitting: Psychologist

## 2021-10-11 ENCOUNTER — Other Ambulatory Visit (HOSPITAL_BASED_OUTPATIENT_CLINIC_OR_DEPARTMENT_OTHER): Payer: Self-pay

## 2021-10-11 DIAGNOSIS — F331 Major depressive disorder, recurrent, moderate: Secondary | ICD-10-CM | POA: Diagnosis not present

## 2021-10-11 DIAGNOSIS — B9689 Other specified bacterial agents as the cause of diseases classified elsewhere: Secondary | ICD-10-CM

## 2021-10-11 DIAGNOSIS — F411 Generalized anxiety disorder: Secondary | ICD-10-CM

## 2021-10-11 DIAGNOSIS — J208 Acute bronchitis due to other specified organisms: Secondary | ICD-10-CM | POA: Diagnosis not present

## 2021-10-11 DIAGNOSIS — J019 Acute sinusitis, unspecified: Secondary | ICD-10-CM | POA: Diagnosis not present

## 2021-10-11 MED ORDER — PREDNISONE 20 MG PO TABS
40.0000 mg | ORAL_TABLET | Freq: Every day | ORAL | 0 refills | Status: DC
  Filled 2021-10-11: qty 14, 7d supply, fill #0

## 2021-10-11 MED ORDER — AZITHROMYCIN 250 MG PO TABS
ORAL_TABLET | ORAL | 0 refills | Status: DC
  Filled 2021-10-11: qty 6, 5d supply, fill #0

## 2021-10-11 NOTE — Progress Notes (Signed)
[facility] Counselor/Therapist Progress Note  Patient ID: Jesse Bowers, MRN: [id number],    Date: 10/11/2021  Time Spent: 01:05 pm to 01:23 pm; total time: 18 minutes   This session was held via video webex teletherapy due to the coronavirus risk at this time. The patient consented to video teletherapy and was located at his home during this session. He is aware it is the responsibility of the patient to secure confidentiality on his end of the session. The provider was in a private home office for the duration of this session. Limits of confidentiality were discussed with the patient.   Treatment Type: Individual Therapy  Reported Symptoms: Experiencing COVID-19 symptoms and some stress  Mental Status Exam: Appearance:  Casual     Behavior: Appropriate  Motor: Normal  Speech/Language:  Normal Rate  Affect: Appropriate  Mood: normal  Thought process: normal  Thought content:   WNL  Sensory/Perceptual disturbances:   WNL  Orientation: oriented to person, place, and time/date  Attention: Good  Concentration: Good  Memory: WNL  Fund of knowledge:  Good  Insight:   Fair  Judgment:  Fair  Impulse Control: Good   Risk Assessment: Danger to Self:  No Self-injurious Behavior: No Danger to Others: No Duty to Warn:no Physical Aggression / Violence:No  Access to Firearms a concern: No  Gang Involvement:No   Subjective: Patient began the session the patient disclosing that he was recovering from COVID-19 and that he wanted a shorter session. He spent the time processing the wedding and reflecting on family interactions. He asked to follow up. He denied suicidal and homicidal ideation.    Interventions:  Worked on developing a therapeutic relationship with the patient. Used active listening and reflective statements. Provided emotional support using empathy and validation. Reflected on events since the last session.Used summary statements. Normalized and validated  expressed thoughts and emotions. Reflected and briefly processed the wedding. Praised patient for having a better experience than anticipating. Processed thoughts and emotions. Reviewed family dynamics. Provided empathic statements. Assessed for suicidal and homicidal ideation.  Homework: NA  Next Session: Emotional support,  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 disorder, recurrent, moderate   Plan:   Goals Alleviate depressive symptoms Recognize, accept, and cope with depressive feelings Develop healthy thinking patterns Develop healthy interpersonal relationships Reduce overall frequency, intensity, and duration of anxiety Stabilize anxiety level wile increasing ability to function Enhance ability to effectively cope with full variety of stressors Learn and implement coping skills that result in a reduction of anxiety   Objectives target date for all objectives is: 09/27/2022 Verbalize an understanding of the cognitive, physiological, and behavioral components of anxiety Learning and implement calming skills to reduce overall anxiety Verbalize an understanding of the role that cognitive biases play in excessive irrational worry and persistent anxiety symptoms Identify, challenge, and replace based fearful talk Learn and implement problem solving strategies Identify and engage in pleasant activities Learning and implement personal and interpersonal skills to reduce anxiety and improve interpersonal relationships Learn to accept limitations in life and commit to tolerating, rather than avoiding, unpleasant emotions while accomplishing meaningful goals Identify major life conflicts from the past and present that form the basis for present anxiety Maintain involvement in work, family, and social activities Reestablish a consistent sleep-wake cycle Cooperate with a medical evaluation  Cooperate with a medication evaluation by a  physician Verbalize an accurate understanding of depression Verbalize an understanding of the treatment Identify and replace thoughts that support depression  Learn and implement behavioral strategies Verbalize an understanding and resolution of current interpersonal problems Learn and implement problem solving and decision making skills Learn and implement conflict resolution skills to resolve interpersonal problems Verbalize an understanding of healthy and unhealthy emotions verbalize insight into how past relationships may be influence current experiences with depression Use mindfulness and acceptance strategies and increase value based behavior  Increase hopeful statements about the future.  Interventions Engage the patient in behavioral activation Use instruction, modeling, and role-playing to build the client's general social, communication, and/or conflict resolution skills Use Acceptance and Commitment Therapy to help client accept uncomfortable realities in order to accomplish value-consistent goals Reinforce the client's insight into the role of his/her past emotional pain and present anxiety  Support the client in following through with work, family, and social activities Teach and implement sleep hygiene practices  Refer the patient to a physician for a psychotropic medication consultation Monito the clint's psychotropic medication compliance Discuss how anxiety typically involves excessive worry, various bodily expressions of tension, and avoidance of what is threatening that interact to maintain the problem  Teach the patient relaxation skills Assign the patient homework Discuss examples demonstrating that unrealistic worry overestimates the probability of threats and underestimates patient's ability  Assist the patient in analyzing his or her worries Help patient understand that avoidance is reinforcing  Consistent with treatment model, discuss how change in cognitive,  behavioral, and interpersonal can help client alleviate depression CBT Behavioral activation help the client explore the relationship, nature of the dispute,  Help the client develop new interpersonal skills and relationships Conduct Problem solving therapy Teach conflict resolution skills Use a process-experiential approach Conduct TLDP Conduct ACT Evaluate need for psychotropic medication Monitor adherence to medication   Patient agreed and reviewed the treatment plan on 10/03/2020. Reassessed the treatment plan. Patient approved of the reassessment on 09/25/2021.   Conception Chancy, PsyD

## 2021-10-11 NOTE — Patient Instructions (Signed)
[name], thank you for joining Mar Daring, PA-C for today's virtual visit.  While this provider is not your primary care provider (PCP), if your PCP is located in our provider database this encounter information will be shared with them immediately following your visit.  Consent: (Patient) Jesse Bowers provided verbal consent for this virtual visit at the beginning of the encounter.  Current Medications:  Current Outpatient Medications:    azithromycin (ZITHROMAX) 250 MG tablet, Take 2 tablets by mouth on day 1, then 1 tablet daily on days 2 through 5, Disp: 6 tablet, Rfl: 0   predniSONE (DELTASONE) 20 MG tablet, Take 2 tablets (40 mg total) by mouth daily with breakfast., Disp: 14 tablet, Rfl: 0   acyclovir ointment (ZOVIRAX) 5 %, as needed., Disp: , Rfl:    baclofen (LIORESAL) 20 MG tablet, Take 1-2 tablets (20-40 mg total) by mouth 4 (four) times daily. 1 tablet with breakfast, lunch, and dinner and 2 tablets at bedtime, Disp: 450 each, Rfl: 2   Calcipotriene-Betameth Diprop 0.005-0.064 % FOAM, Apply 1 application. topically 2 (two) times daily as needed., Disp: , Rfl:    Calcium Carbonate Antacid (TUMS ULTRA 1000 PO), Take by mouth daily., Disp: , Rfl:    ciprofloxacin-dexamethasone (CIPRODEX) OTIC suspension, , Disp: , Rfl:    Clobetasol Propionate Emulsion 0.05 % topical foam, daily., Disp: , Rfl:    Clobetasol Propionate Emulsion 0.05 % topical foam, Apply a thin layer to the affected area(s) on the scalp, Disp: 100 g, Rfl: 5   clorazepate (TRANXENE) 15 MG tablet, Take 15 mg by mouth at bedtime., Disp: , Rfl:    clorazepate (TRANXENE) 15 MG tablet, Take 1 tablet by mouth at night., Disp: 90 tablet, Rfl: 1   clorazepate (TRANXENE) 7.5 MG tablet, Take 7.5 mg by mouth 2 (two) times daily., Disp: , Rfl:    clorazepate (TRANXENE) 7.5 MG tablet, Take 1 tab 2 times a day (also takes 25m at night), Disp: 180 tablet, Rfl: 1   desonide (DESOWEN) 0.05 % cream, Apply topically 2  (two) times daily., Disp: , Rfl:    desonide (DESOWEN) 0.05 % cream, 1(one) application(s) topical 2(two) times a day, Disp: 60 g, Rfl: 2   desvenlafaxine (PRISTIQ) 100 MG 24 hr tablet, Take 100 mg by mouth daily., Disp: , Rfl:    desvenlafaxine (PRISTIQ) 100 MG 24 hr tablet, Take 1 tab daily,, Disp: 90 tablet, Rfl: 1   ergocalciferol (VITAMIN D2) 1.25 MG (50000 UT) capsule, Take 1 capsule by mouth once a week., Disp: , Rfl:    FIBER SELECT GUMMIES PO, Take by mouth daily., Disp: , Rfl:    folic acid (FOLVITE) 1 MG tablet, Take 1 tablet by mouth every day., Disp: 90 tablet, Rfl: 1   furosemide (LASIX) 20 MG tablet, Take 1 tablet (20 mg total) by mouth daily as needed for edema., Disp: 30 tablet, Rfl: 1   furosemide (LASIX) 80 MG tablet, Take 1 tablet (80 mg total) by mouth daily., Disp: 90 tablet, Rfl: 3   halobetasol (ULTRAVATE) 0.05 % cream, Apply topically., Disp: , Rfl:    halobetasol (ULTRAVATE) 0.05 % cream, Apply to affected area twice daily to hands, feet and knees., Disp: 50 g, Rfl: 1   linaclotide (LINZESS) 290 MCG CAPS capsule, Take 1 capsule by mouth daily before breakfast., Disp: 30 capsule, Rfl: 8   linaclotide (LINZESS) 290 MCG CAPS capsule, Take 1 capsule by mouth once daily., Disp: 90 capsule, Rfl: 0   methotrexate (RHEUMATREX) 2.5  MG tablet, Take 15 mg by mouth once a week. Caution:Chemotherapy. Protect from light., Disp: , Rfl:    methotrexate (RHEUMATREX) 2.5 MG tablet, Take 6.0 tablets po once weekly, Disp: 48 tablet, Rfl: 0   metoprolol tartrate (LOPRESSOR) 25 MG tablet, Take 1 tablet (25 mg total) by mouth 2 (two) times daily as needed., Disp: 30 tablet, Rfl: 3   morphine (MS CONTIN) 30 MG 12 hr tablet, Take 1 tablet (30 mg total) by mouth every 12 (twelve) hours., Disp: 60 tablet, Rfl: 0   Multiple Vitamins-Minerals (ALIVE MULTI-VITAMIN) CHEW, , Disp: , Rfl:    naloxegol oxalate (MOVANTIK) 25 MG TABS tablet, Take 1 tablet (25 mg total) by mouth daily., Disp: 30 tablet, Rfl:  2   Oxycodone HCl 10 MG TABS, Take 1 tablet (10 mg total) by mouth every 8 (eight) hours as needed (pain)., Disp: 70 tablet, Rfl: 0   polyethylene glycol powder (GLYCOLAX/MIRALAX) 17 GM/SCOOP powder, Purelax 17 gram/dose oral powder  TAKE 1 CAPFUL MIXED INTO 8 OZ OF CLEAR LIQUID UP TO FOUR TIMES A DAY AS NEEDED, Disp: , Rfl:    potassium chloride (KLOR-CON) 10 MEQ tablet, Take by mouth., Disp: , Rfl:    potassium chloride (KLOR-CON) 10 MEQ tablet, Take 2 tablets by mouth every day., Disp: 180 tablet, Rfl: 3   RESTASIS 0.05 % ophthalmic emulsion, 1 drop 2 (two) times daily., Disp: , Rfl:    rivaroxaban (XARELTO) 20 MG TABS tablet, Take 1 tablet (20 mg total) by mouth nightly., Disp: 90 tablet, Rfl: 0   Secukinumab (COSENTYX SENSOREADY PEN) 150 MG/ML SOAJ, Inject 2 pens subcutaneously every 4 weeks, Disp: 2 mL, Rfl: 9   Tenapanor HCl (IBSRELA) 50 MG TABS, Take 1 tablet (50 mg total) by mouth 2 times daily., Disp: 180 tablet, Rfl: 1   testosterone cypionate (DEPOTESTOSTERONE CYPIONATE) 200 MG/ML injection, INJECT 0.5 MLS (100 MG TOTAL) INTO THE MUSCLE ONCE A WEEK., Disp: 10 mL, Rfl: 0   ursodiol (ACTIGALL) 250 MG tablet, Take 1 tablet by mouth at lunch and dinner in addition to ursodiol 500 mg for a total 750 mg dose., Disp: 60 tablet, Rfl: 7   ursodiol (ACTIGALL) 500 MG tablet, Take 1 tablet (500 mg) by mouth at lunch and dinner in addition to ursodiol 250 mg for a total 750 mg dose., Disp: 60 tablet, Rfl: 11   Vitamin D, Ergocalciferol, (DRISDOL) 1.25 MG (50000 UNIT) CAPS capsule, Take 1 capsule by mouth once weekly., Disp: 12 capsule, Rfl: 0   XARELTO 20 MG TABS tablet, TAKE 1 TABLET(20 MG) BY MOUTH EVERY NIGHT AT BEDTIME, Disp: 30 tablet, Rfl: 11   Medications ordered in this encounter:  Meds ordered this encounter  Medications   azithromycin (ZITHROMAX) 250 MG tablet    Sig: Take 2 tablets by mouth on day 1, then 1 tablet daily on days 2 through 5    Dispense:  6 tablet    Refill:  0    Order  Specific Question:   Supervising Provider    Answer:   Sabra Heck, BRIAN [[id number]]   predniSONE (DELTASONE) 20 MG tablet    Sig: Take 2 tablets (40 mg total) by mouth daily with breakfast.    Dispense:  14 tablet    Refill:  0    Order Specific Question:   Supervising Provider    Answer:   Noemi Chapel [[id number]]     *If you need refills on other medications prior to your next appointment, please contact your pharmacy*  Follow-Up:  Call back or seek an in-person evaluation if the symptoms worsen or if the condition fails to improve as anticipated.  Other Instructions Acute Bronchitis, Adult  Acute bronchitis is sudden inflammation of the main airways (bronchi) that come off the windpipe (trachea) in the lungs. The swelling causes the airways to get smaller and make more mucus than normal. This can make it hard to breathe and can cause coughing or noisy breathing (wheezing). Acute bronchitis may last several weeks. The cough may last longer. Allergies, asthma, and exposure to smoke may make the condition worse. What are the causes? This condition can be caused by germs and by substances that irritate the lungs, including: Cold and flu viruses. The most common cause of this condition is the virus that causes the common cold. Bacteria. This is less common. Breathing in substances that irritate the lungs, including: Smoke from cigarettes and other forms of tobacco. Dust and pollen. Fumes from household cleaning products, gases, or burned fuel. Indoor or outdoor air pollution. What increases the risk? The following factors may make you more likely to develop this condition: A weak body's defense system, also called the immune system. A condition that affects your lungs and breathing, such as asthma. What are the signs or symptoms? Common symptoms of this condition include: Coughing. This may bring up clear, yellow, or green mucus from your lungs (sputum). Wheezing. Runny or stuffy nose. Having  too much mucus in your lungs (chest congestion). Shortness of breath. Aches and pains, including sore throat or chest. How is this diagnosed? This condition is usually diagnosed based on: Your symptoms and medical history. A physical exam. You may also have other tests, including tests to rule out other conditions, such as pneumonia. These tests include: A test of lung function. Test of a mucus sample to look for the presence of bacteria. Tests to check the oxygen level in your blood. Blood tests. Chest X-ray. How is this treated? Most cases of acute bronchitis clear up over time without treatment. Your health care provider may recommend: Drinking more fluids to help thin your mucus so it is easier to cough up. Taking inhaled medicine (inhaler) to improve air flow in and out of your lungs. Using a vaporizer or a humidifier. These are machines that add water to the air to help you breathe better. Taking a medicine that thins mucus and clears congestion (expectorant). Taking a medicine that prevents or stops coughing (cough suppressant). It is not common to take an antibiotic medicine for this condition. Follow these instructions at home:  Take over-the-counter and prescription medicines only as told by your health care provider. Use an inhaler, vaporizer, or humidifier as told by your health care provider. Take two teaspoons (10 mL) of honey at bedtime to lessen coughing at night. Drink enough fluid to keep your urine pale yellow. Do not use any products that contain nicotine or tobacco. These products include cigarettes, chewing tobacco, and vaping devices, such as e-cigarettes. If you need help quitting, ask your health care provider. Get plenty of rest. Return to your normal activities as told by your health care provider. Ask your health care provider what activities are safe for you. Keep all follow-up visits. This is important. How is this prevented? To lower your risk of getting  this condition again: Wash your hands often with soap and water for at least 20 seconds. If soap and water are not available, use hand sanitizer. Avoid contact with people who have cold symptoms. Try not  to touch your mouth, nose, or eyes with your hands. Avoid breathing in smoke or chemical fumes. Breathing smoke or chemical fumes will make your condition worse. Get the flu shot every year. Contact a health care provider if: Your symptoms do not improve after 2 weeks. You have trouble coughing up the mucus. Your cough keeps you awake at night. You have a fever. Get help right away if you: Cough up blood. Feel pain in your chest. Have severe shortness of breath. Faint or keep feeling like you are going to faint. Have a severe headache. Have a fever or chills that get worse. These symptoms may represent a serious problem that is an emergency. Do not wait to see if the symptoms will go away. Get medical help right away. Call your local emergency services (911 in the [place] Do not drive yourself to the hospital. Summary Acute bronchitis is inflammation of the main airways (bronchi) that come off the windpipe (trachea) in the lungs. The swelling causes the airways to get smaller and make more mucus than normal. Drinking more fluids can help thin your mucus so it is easier to cough up. Take over-the-counter and prescription medicines only as told by your health care provider. Do not use any products that contain nicotine or tobacco. These products include cigarettes, chewing tobacco, and vaping devices, such as e-cigarettes. If you need help quitting, ask your health care provider. Contact a health care provider if your symptoms do not improve after 2 weeks. This information is not intended to replace advice given to you by your health care provider. Make sure you discuss any questions you have with your health care provider. Document Revised: 05/30/2021 Document Reviewed: 06/20/2020 Elsevier  Patient Education  [place].    If you have been instructed to have an in-person evaluation today at a local Urgent Care facility, please use the link below. It will take you to a list of all of our available [facility], including address, phone number and hours of operation. Please do not delay care.  [facility]  If you or a family member do not have a primary care provider, use the link below to schedule a visit and establish care. When you choose a [facility] primary care physician or advanced practice provider, you gain a long-term partner in health. Find a Primary Care Provider  Learn more about 's in-office and virtual care options: [facility]

## 2021-10-11 NOTE — Progress Notes (Signed)
Virtual Visit Consent   Jesse Bowers, you are scheduled for a virtual visit with a [facility] provider today. Just as with appointments in the office, your consent must be obtained to participate. Your consent will be active for this visit and any virtual visit you may have with one of our providers in the next 365 days. If you have a MyChart account, a copy of this consent can be sent to you electronically.  As this is a virtual visit, video technology does not allow for your provider to perform a traditional examination. This may limit your provider's ability to fully assess your condition. If your provider identifies any concerns that need to be evaluated in person or the need to arrange testing (such as labs, EKG, etc.), we will make arrangements to do so. Although advances in technology are sophisticated, we cannot ensure that it will always work on either your end or our end. If the connection with a video visit is poor, the visit may have to be switched to a telephone visit. With either a video or telephone visit, we are not always able to ensure that we have a secure connection.  By engaging in this virtual visit, you consent to the provision of healthcare and authorize for your insurance to be billed (if applicable) for the services provided during this visit. Depending on your insurance coverage, you may receive a charge related to this service.  I need to obtain your verbal consent now. Are you willing to proceed with your visit today? Jesse Bowers has provided verbal consent on 10/11/2021 for a virtual visit (video or telephone). Mar Daring, PA-C  Date: 10/11/2021 3:04 PM  Virtual Visit via Video Note   I, Mar Daring, connected with  Jesse Bowers  ([id number], 04/21/1968) on 10/11/21 at  3:00 PM EDT by a video-enabled telemedicine application and verified that I am speaking with the correct person using two identifiers.  Location: Patient: Virtual Visit Location Patient:  Home Provider: Virtual Visit Location Provider: Home Office   I discussed the limitations of evaluation and management by telemedicine and the availability of in person appointments. The patient expressed understanding and agreed to proceed.    History of Present Illness: Jesse Bowers is a 56 y.o. who identifies as a male who was assigned adult at birth, and is being seen today for possible secondary infection following Covid 40. Tested positive and was seen virtually on 10/01/21. Started on [employer]. Symptoms have continued. Does report mild symptom improvement by Wednesday, 10/09/21, but by that evening had felt like he hit a brick wall. Has been having cough, chest heaviness, mild shortness of breath, and continued congestion.   Problems:  Patient Active Problem List   Diagnosis Date Noted   Spondylosis of lumbar spine 10/03/2020   Pseudophakia of both eyes 05/07/2020   Retinal telangiectasis of both eyes 05/07/2020   Lattice degeneration of peripheral retina, left 09/08/2019   Lattice degeneration, right eye 09/08/2019   Chronic diastolic heart failure ([place]) 05/24/2019   Urinary dysfunction 04/12/2019   Osteoarthritis of right knee 08/23/2018   Constipation due to opioid therapy 03/30/2018   SNHL (sensorineural hearing loss) 12/04/2017   Abnormal urinary stream 12/03/2017   Osteoarthritis of carpometacarpal (CMC) joint of thumb 11/30/2017   Muscle weakness 11/17/2017   Transient vision disturbance 11/12/2017   Bilateral hand pain 10/30/2017   Pain of left hip joint 10/09/2017   Gynecomastia 07/10/2017   Iron deficiency anemia 07/05/2017   Spasticity 05/20/2017  Lumbar radiculitis 04/20/2017   Ulnar 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, left 11/28/2016   Idiopathic peripheral neuropathy 11/28/2016   Failed total knee arthroplasty, 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   Long term (current) use of anticoagulants 08/23/2015   Right upper quadrant abdominal pain 08/23/2015   Memory loss 05/10/2015   Gait  abnormality 04/07/2015   Alkaline phosphatase elevation 04/07/2015   History of thrombosis 03/26/2015   Medial epicondylitis 02/07/2015   Cognitive decline 12/21/2014   Leukopenia 12/05/2014   Rotator cuff syndrome of right shoulder 10/27/2014   Status post left knee replacement 08/22/2014   Left lateral epicondylitis 08/22/2014   Chronic cerebral ischemia 08/18/2014   Arthrofibrosis of knee joint 08/17/2014   Cubital canal compression syndrome, left 08/17/2014   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[place]) 04/10/2014   Chronic pain syndrome 04/10/2014   Insomnia 04/10/2014   Chronic non-specific white matter lesions on MRI 04/10/2014   CFS (chronic fatigue syndrome) 04/10/2014   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[place]) 03/01/2014   Biceps tendonitis on left 03/01/2014   Polycythemia vera ([place]) 12/27/2013   H/O TIA (transient ischemic attack) and stroke 12/27/2013   Neck pain 12/27/2013   OSA (obstructive sleep apnea) 12/08/2013   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/31/2013   Depression with anxiety 08/04/2013   Protein C deficiency ([place]) 08/01/2013   Post traumatic stress disorder (PTSD) 08/01/2013   Speech abnormality 07/25/2013   Obesity 05/10/2013   Lower extremity edema 05/10/2013   GERD (gastroesophageal reflux disease) 05/10/2013   Arthritis 05/10/2013   OA (osteoarthritis) of knee 03/15/2013   Headache 10/25/2012   Palpitations 10/18/2012   Fatty liver determined by biopsy 06/01/2012   Arthropathic psoriasis, unspecified ([place]) 04/29/2012   Abnormal liver enzymes 03/29/2012   Psoriatic arthritis ([place]) 12/29/2011   Left Renal Hydronephrosis 12/11/2010   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 Pt. States, " It makes me  crazy, I can't take this medicine." Other reaction(s): Other (See Comments) Tardive dyskinesia   Penicillin G Anaphylaxis and Other (See Comments)    Has patient had a PCN reaction causing immediate rash, facial/tongue/throat swelling, SOB or lightheadedness with hypotension: Yes Has patient had a PCN reaction causing severe rash involving mucus membranes or skin necrosis: No Has patient had a PCN reaction that required hospitalization Yes Has patient had a PCN reaction occurring within the last 10 years: No If all of the above answers are "NO", then may proceed with Cephalosporin use.    Sulfa Antibiotics Rash    Stevens-Johnson rash   Nortriptyline Other (See Comments)    Dry mouth at 25 mg dose.  Tolerates 10 mg dose Other reaction(s): Other (See Comments) Dry Mouth   Pregabalin Other (See Comments)    Ineffective Other reaction(s): Other (See Comments) Tardive dyskinesia   Tegaderm Ag Mesh [Silver] Dermatitis    Causes blistering wounds    Ibuprofen Other (See Comments)    Contraindicated with Xarelto.    Sulfacetamide Sodium-Sulfur Rash   Medications:  Current Outpatient Medications:    azithromycin (ZITHROMAX) 250 MG tablet, Take 2 tablets by mouth on day 1, then 1 tablet daily on days 2 through 5, Disp: 6 tablet, Rfl: 0   predniSONE (DELTASONE) 20 MG tablet, Take 2 tablets (40 mg total) by mouth daily with breakfast., Disp: 14 tablet, Rfl: 0  acyclovir ointment (ZOVIRAX) 5 %, as needed., Disp: , Rfl:    baclofen (LIORESAL) 20 MG tablet, Take 1-2 tablets (20-40 mg total) by mouth 4 (four) times daily. 1 tablet with breakfast, lunch, and dinner and 2 tablets at bedtime, Disp: 450 each, Rfl: 2   Calcipotriene-Betameth Diprop 0.005-0.064 % FOAM, Apply 1 application. topically 2 (two) times daily as needed., Disp: , Rfl:    Calcium Carbonate Antacid (TUMS ULTRA 1000 PO), Take by mouth daily., Disp: , Rfl:    ciprofloxacin-dexamethasone (CIPRODEX) OTIC suspension, , Disp: , Rfl:     Clobetasol Propionate Emulsion 0.05 % topical foam, daily., Disp: , Rfl:    Clobetasol Propionate Emulsion 0.05 % topical foam, Apply a thin layer to the affected area(s) on the scalp, Disp: 100 g, Rfl: 5   clorazepate (TRANXENE) 15 MG tablet, Take 15 mg by mouth at bedtime., Disp: , Rfl:    clorazepate (TRANXENE) 15 MG tablet, Take 1 tablet by mouth at night., Disp: 90 tablet, Rfl: 1   clorazepate (TRANXENE) 7.5 MG tablet, Take 7.5 mg by mouth 2 (two) times daily., Disp: , Rfl:    clorazepate (TRANXENE) 7.5 MG tablet, Take 1 tab 2 times a day (also takes 41m at night), Disp: 180 tablet, Rfl: 1   desonide (DESOWEN) 0.05 % cream, Apply topically 2 (two) times daily., Disp: , Rfl:    desonide (DESOWEN) 0.05 % cream, 1(one) application(s) topical 2(two) times a day, Disp: 60 g, Rfl: 2   desvenlafaxine (PRISTIQ) 100 MG 24 hr tablet, Take 100 mg by mouth daily., Disp: , Rfl:    desvenlafaxine (PRISTIQ) 100 MG 24 hr tablet, Take 1 tab daily,, Disp: 90 tablet, Rfl: 1   ergocalciferol (VITAMIN D2) 1.25 MG (50000 UT) capsule, Take 1 capsule by mouth once a week., Disp: , Rfl:    FIBER SELECT GUMMIES PO, Take by mouth daily., Disp: , Rfl:    folic acid (FOLVITE) 1 MG tablet, Take 1 tablet by mouth every day., Disp: 90 tablet, Rfl: 1   furosemide (LASIX) 20 MG tablet, Take 1 tablet (20 mg total) by mouth daily as needed for edema., Disp: 30 tablet, Rfl: 1   furosemide (LASIX) 80 MG tablet, Take 1 tablet (80 mg total) by mouth daily., Disp: 90 tablet, Rfl: 3   halobetasol (ULTRAVATE) 0.05 % cream, Apply topically., Disp: , Rfl:    halobetasol (ULTRAVATE) 0.05 % cream, Apply to affected area twice daily to hands, feet and knees., Disp: 50 g, Rfl: 1   linaclotide (LINZESS) 290 MCG CAPS capsule, Take 1 capsule by mouth daily before breakfast., Disp: 30 capsule, Rfl: 8   linaclotide (LINZESS) 290 MCG CAPS capsule, Take 1 capsule by mouth once daily., Disp: 90 capsule, Rfl: 0   methotrexate (RHEUMATREX) 2.5  MG tablet, Take 15 mg by mouth once a week. Caution:Chemotherapy. Protect from light., Disp: , Rfl:    methotrexate (RHEUMATREX) 2.5 MG tablet, Take 6.0 tablets po once weekly, Disp: 48 tablet, Rfl: 0   metoprolol tartrate (LOPRESSOR) 25 MG tablet, Take 1 tablet (25 mg total) by mouth 2 (two) times daily as needed., Disp: 30 tablet, Rfl: 3   morphine (MS CONTIN) 30 MG 12 hr tablet, Take 1 tablet (30 mg total) by mouth every 12 (twelve) hours., Disp: 60 tablet, Rfl: 0   Multiple Vitamins-Minerals (ALIVE MULTI-VITAMIN) CHEW, , Disp: , Rfl:    naloxegol oxalate (MOVANTIK) 25 MG TABS tablet, Take 1 tablet (25 mg total) by mouth daily., Disp: 30 tablet, Rfl: 2  Oxycodone HCl 10 MG TABS, Take 1 tablet (10 mg total) by mouth every 8 (eight) hours as needed (pain)., Disp: 70 tablet, Rfl: 0   polyethylene glycol powder (GLYCOLAX/MIRALAX) 17 GM/SCOOP powder, Purelax 17 gram/dose oral powder  TAKE 1 CAPFUL MIXED INTO 8 OZ OF CLEAR LIQUID UP TO FOUR TIMES A DAY AS NEEDED, Disp: , Rfl:    potassium chloride (KLOR-CON) 10 MEQ tablet, Take by mouth., Disp: , Rfl:    potassium chloride (KLOR-CON) 10 MEQ tablet, Take 2 tablets by mouth every day., Disp: 180 tablet, Rfl: 3   RESTASIS 0.05 % ophthalmic emulsion, 1 drop 2 (two) times daily., Disp: , Rfl:    rivaroxaban (XARELTO) 20 MG TABS tablet, Take 1 tablet (20 mg total) by mouth nightly., Disp: 90 tablet, Rfl: 0   Secukinumab (COSENTYX SENSOREADY PEN) 150 MG/ML SOAJ, Inject 2 pens subcutaneously every 4 weeks, Disp: 2 mL, Rfl: 9   Tenapanor HCl (IBSRELA) 50 MG TABS, Take 1 tablet (50 mg total) by mouth 2 times daily., Disp: 180 tablet, Rfl: 1   testosterone cypionate (DEPOTESTOSTERONE CYPIONATE) 200 MG/ML injection, INJECT 0.5 MLS (100 MG TOTAL) INTO THE MUSCLE ONCE A WEEK., Disp: 10 mL, Rfl: 0   ursodiol (ACTIGALL) 250 MG tablet, Take 1 tablet by mouth at lunch and dinner in addition to ursodiol 500 mg for a total 750 mg dose., Disp: 60 tablet, Rfl: 7   ursodiol  (ACTIGALL) 500 MG tablet, Take 1 tablet (500 mg) by mouth at lunch and dinner in addition to ursodiol 250 mg for a total 750 mg dose., Disp: 60 tablet, Rfl: 11   Vitamin D, Ergocalciferol, (DRISDOL) 1.25 MG (50000 UNIT) CAPS capsule, Take 1 capsule by mouth once weekly., Disp: 12 capsule, Rfl: 0   XARELTO 20 MG TABS tablet, TAKE 1 TABLET(20 MG) BY MOUTH EVERY NIGHT AT BEDTIME, Disp: 30 tablet, Rfl: 11  Observations/Objective: Patient is well-developed, well-nourished in no acute distress.  Resting comfortably at home.  Head is normocephalic, atraumatic.  No labored breathing.  Speech is clear and coherent with logical content.  Patient is alert and oriented at baseline.    Assessment and Plan: 1. Acute bacterial bronchitis - azithromycin (ZITHROMAX) 250 MG tablet; Take 2 tablets by mouth on day 1, then 1 tablet daily on days 2 through 5  Dispense: 6 tablet; Refill: 0 - predniSONE (DELTASONE) 20 MG tablet; Take 2 tablets (40 mg total) by mouth daily with breakfast.  Dispense: 14 tablet; Refill: 0  2. Acute bacterial sinusitis - azithromycin (ZITHROMAX) 250 MG tablet; Take 2 tablets by mouth on day 1, then 1 tablet daily on days 2 through 5  Dispense: 6 tablet; Refill: 0 - predniSONE (DELTASONE) 20 MG tablet; Take 2 tablets (40 mg total) by mouth daily with breakfast.  Dispense: 14 tablet; Refill: 0  - Worsening symptoms that have not responded to OTC medications.  - Will give Azithromycin and prednisone - Continue allergy medications.  - Steam and humidifier can help - Stay well hydrated and get plenty of rest.  - Seek in person evaluation if no symptom improvement or if symptoms worsen   Follow Up Instructions: I discussed the assessment and treatment plan with the patient. The patient was provided an opportunity to ask questions and all were answered. The patient agreed with the plan and demonstrated an understanding of the instructions.  A copy of instructions were sent to the  patient via MyChart unless otherwise noted below.    The patient was advised to  call back or seek an in-person evaluation if the symptoms worsen or if the condition fails to improve as anticipated.  Time:  I spent 10 minutes with the patient via telehealth technology discussing the above problems/concerns.    Mar Daring, PA-C

## 2021-10-14 ENCOUNTER — Encounter: Payer: Self-pay | Admitting: Physician Assistant

## 2021-10-14 ENCOUNTER — Encounter (INDEPENDENT_AMBULATORY_CARE_PROVIDER_SITE_OTHER): Payer: BC Managed Care – PPO | Admitting: Ophthalmology

## 2021-10-15 ENCOUNTER — Other Ambulatory Visit (HOSPITAL_BASED_OUTPATIENT_CLINIC_OR_DEPARTMENT_OTHER): Payer: Self-pay

## 2021-10-15 MED ORDER — CLONAZEPAM 0.5 MG PO TABS
ORAL_TABLET | ORAL | 2 refills | Status: DC
  Filled 2021-10-15: qty 120, 30d supply, fill #0

## 2021-10-15 MED ORDER — CYCLOSPORINE 0.05 % OP EMUL
1.0000 [drp] | Freq: Two times a day (BID) | OPHTHALMIC | 4 refills | Status: DC
  Filled 2021-10-15: qty 180, 90d supply, fill #0
  Filled 2022-03-18: qty 180, 90d supply, fill #1

## 2021-10-16 ENCOUNTER — Other Ambulatory Visit: Payer: 59

## 2021-10-18 ENCOUNTER — Ambulatory Visit (INDEPENDENT_AMBULATORY_CARE_PROVIDER_SITE_OTHER): Payer: 59 | Admitting: Psychologist

## 2021-10-18 DIAGNOSIS — F331 Major depressive disorder, recurrent, moderate: Secondary | ICD-10-CM | POA: Diagnosis not present

## 2021-10-18 DIAGNOSIS — F411 Generalized anxiety disorder: Secondary | ICD-10-CM

## 2021-10-18 NOTE — Progress Notes (Signed)
[facility] Counselor/Therapist Progress Note  Patient ID: Jesse Bowers, MRN: [id number],    Date: 10/18/2021  Time Spent: 01:06 pm to 01:45 pm; total time: 39 minutes   This session was held via video webex teletherapy due to the coronavirus risk at this time. The patient consented to video teletherapy and was located at his home during this session. He is aware it is the responsibility of the patient to secure confidentiality on his end of the session. The provider was in a private home office for the duration of this session. Limits of confidentiality were discussed with the patient.   Treatment Type: Individual Therapy  Reported Symptoms: Experiencing stress related to spouse  Mental Status Exam: Appearance:  Casual     Behavior: Appropriate  Motor: Normal  Speech/Language:  Normal Rate  Affect: Appropriate  Mood: normal  Thought process: normal  Thought content:   WNL  Sensory/Perceptual disturbances:   WNL  Orientation: oriented to person, place, and time/date  Attention: Good  Concentration: Good  Memory: WNL  Fund of knowledge:  Good  Insight:   Fair  Judgment:  Fair  Impulse Control: Good   Risk Assessment: Danger to Self:  No Self-injurious Behavior: No Danger to Others: No Duty to Warn:no Physical Aggression / Violence:No  Access to Firearms a concern: No  Gang Involvement:No   Subjective: Patient began the session the patient stating that he was looking forward to getting out of quarantine and what he was planning to do to celebrate. From there, he talked about wanting to get back his "swags" and explored what this meant. Elaborating, he came to the realization that it revolved around his wife. He processed what this looked like. He also reflected on other areas he could focus on in therapy if he is unable to address the concerns he experiences related to his wife. He asked to follow up. He denied suicidal and homicidal ideation.    Interventions:   Worked on developing a therapeutic relationship with the patient. Used active listening and reflective statements. Provided emotional support using empathy and validation. Used summary and reflective statements. Praised patient for almost completing his quarantine time. Used socratic questions to assist the patient gain insight into self. Explored what the theme of "swags" meant to the patient. Validated thoughts and emotions. Explored what steps could assist him in getting that theme back in his life. Spent time reflecting on whether patient needs to consider changing direction in therapy. Processed thoughts and emotions. Discussed future steps and considering to change direction if necessary. Validated thoughts. Provided empathic statements. Assessed for suicidal and homicidal ideation.  Homework: Reflect on whether he needs to change direction if unsuccessful with spouse until the next appointment  Next Session: Emotional support and review homework.   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 disorder, recurrent, moderate   Plan:   Goals Alleviate depressive symptoms Recognize, accept, and cope with depressive feelings Develop healthy thinking patterns Develop healthy interpersonal relationships Reduce overall frequency, intensity, and duration of anxiety Stabilize anxiety level wile increasing ability to function Enhance ability to effectively cope with full variety of stressors Learn and implement coping skills that result in a reduction of anxiety   Objectives target date for all objectives is: 09/27/2022 Verbalize an understanding of the cognitive, physiological, and behavioral components of anxiety Learning and implement calming skills to reduce overall anxiety Verbalize an understanding of the role that cognitive biases play in excessive irrational worry and persistent anxiety symptoms  Identify, challenge, and replace based fearful  talk Learn and implement problem solving strategies Identify and engage in pleasant activities Learning and implement personal and interpersonal skills to reduce anxiety and improve interpersonal relationships Learn to accept limitations in life and commit to tolerating, rather than avoiding, unpleasant emotions while accomplishing meaningful goals Identify major life conflicts from the past and present that form the basis for present anxiety Maintain involvement in work, family, and social activities Reestablish a consistent sleep-wake cycle Cooperate with a medical evaluation  Cooperate with a medication evaluation by a physician Verbalize an accurate understanding of depression Verbalize an understanding of the treatment Identify and replace thoughts that support depression Learn and implement behavioral strategies Verbalize an understanding and resolution of current interpersonal problems Learn and implement problem solving and decision making skills Learn and implement conflict resolution skills to resolve interpersonal problems Verbalize an understanding of healthy and unhealthy emotions verbalize insight into how past relationships may be influence current experiences with depression Use mindfulness and acceptance strategies and increase value based behavior  Increase hopeful statements about the future.  Interventions Engage the patient in behavioral activation Use instruction, modeling, and role-playing to build the client's general social, communication, and/or conflict resolution skills Use Acceptance and Commitment Therapy to help client accept uncomfortable realities in order to accomplish value-consistent goals Reinforce the client's insight into the role of his/her past emotional pain and present anxiety  Support the client in following through with work, family, and social activities Teach and implement sleep hygiene practices  Refer the patient to a physician for a  psychotropic medication consultation Monito the clint's psychotropic medication compliance Discuss how anxiety typically involves excessive worry, various bodily expressions of tension, and avoidance of what is threatening that interact to maintain the problem  Teach the patient relaxation skills Assign the patient homework Discuss examples demonstrating that unrealistic worry overestimates the probability of threats and underestimates patient's ability  Assist the patient in analyzing his or her worries Help patient understand that avoidance is reinforcing  Consistent with treatment model, discuss how change in cognitive, behavioral, and interpersonal can help client alleviate depression CBT Behavioral activation help the client explore the relationship, nature of the dispute,  Help the client develop new interpersonal skills and relationships Conduct Problem solving therapy Teach conflict resolution skills Use a process-experiential approach Conduct TLDP Conduct ACT Evaluate need for psychotropic medication Monitor adherence to medication   Patient agreed and reviewed the treatment plan on 10/03/2020. Reassessed the treatment plan. Patient approved of the reassessment on 09/25/2021.   Conception Chancy, PsyD

## 2021-10-23 ENCOUNTER — Ambulatory Visit
Admission: RE | Admit: 2021-10-23 | Discharge: 2021-10-23 | Disposition: A | Discharge status: Home / Self Care | Payer: 59 | Source: Ambulatory Visit | Attending: Physical Medicine & Rehabilitation | Admitting: Physical Medicine & Rehabilitation

## 2021-10-23 DIAGNOSIS — G950 Syringomyelia and syringobulbia: Secondary | ICD-10-CM

## 2021-10-23 DIAGNOSIS — M47816 Spondylosis without myelopathy or radiculopathy, lumbar region: Secondary | ICD-10-CM

## 2021-10-25 ENCOUNTER — Ambulatory Visit: Payer: 59 | Admitting: Psychologist

## 2021-10-27 ENCOUNTER — Encounter: Payer: Self-pay | Admitting: Physical Medicine & Rehabilitation

## 2021-10-27 DIAGNOSIS — M542 Cervicalgia: Secondary | ICD-10-CM

## 2021-10-28 NOTE — Telephone Encounter (Signed)
Reviewed Jesse Bowers's MRI. Will make referral to [facility] at [facility].

## 2021-10-29 NOTE — Therapy (Addendum)
OUTPATIENT PHYSICAL THERAPY CERVICAL EVALUATION   Patient Name: Jesse Bowers MRN: [id number] DOB:[birth year], 56 y.o., adult Today's Date: 10/30/2021   PT End of Session - 10/30/21 1237     Visit Number 1    Date for PT Re-Evaluation 12/25/21    Authorization Type UMR    PT Start Time 1146    PT Stop Time 1231    PT Time Calculation (min) 45 min    Activity Tolerance Patient tolerated treatment well    Behavior During Therapy WFL for tasks assessed/performed             Past Medical History:  Diagnosis Date   Abnormal weight loss    Anxiety    Arthritis    Cataract    OU   Celiac disease    Cervical neck pain with evidence of disc disease    patient has a cyst    Chronic constipation    Chronic diastolic heart failure (HCC)    Pt. 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   Family history of adverse reaction to anesthesia    family has problems with anesthesia of nausea and vomiting    GERD (gastroesophageal reflux disease)    History of in 20's   Gluten enteropathy    H/O parotitis    right    Hard of hearing    History of kidney stones    History of retinal tear    Bilateral   History of staph infection    required wound vac   Hx-TIA (transient ischemic attack)    2015   Kidney stones 08/2020   LVH (left ventricular hypertrophy) 12/15/2016   Mild, noted on ECHO   MVP (mitral valve prolapse)    NAFL (nonalcoholic fatty liver)    Neck pain    Neuromuscular disorder ([place])    bilateral neuropathy feet.   Nuclear sclerotic cataract of both eyes 09/08/2019   Pneumonia 12/17/2010   Polycythemia    Polycythemia, secondary    PONV (postoperative nausea and vomiting)    Protein C deficiency (HCC)    Dr. Anne Fu   Psoriasis    16 X10  cm psoriatic rash on sole of left foot ; open and occ scant bleeding;    psoriatic arthritis    PTSD (post-traumatic stress disorder)    Scaphoid fracture of wrist 09/23/2013   Seizure ([place])    childhood, medication until age 41 then weanned completely off   Sleep apnea    split night study last done by Dr. [name] 06/18/15 shows severe OSA, CSA, and hypersomnia, rec bipap   Splenomegaly    Stenosis of ureteropelvic junction (UPJ)    left   Stroke [facility])    CVA vs TIA in left cerebrum causing slight right sided weakness-Dr. [name] follows   Syrinx of spinal cord ([place]) 01/06/2014   c spine on MRI   Tachycardia    hx of    Transfusion history    past history- none recent, after surgeries due to blood loss   Transgender with history of gender affirmation surgery    Wears glasses    Wears hearing aid    Past Surgical History:  Procedure Laterality Date  ABDOMINAL HYSTERECTOMY Bilateral 1994   TAH, BSO- tranverse incision at 56 yo   ANKLE ARTHROSCOPY WITH RECONSTRUCTION Right 2007   CHOLECYSTECTOMY     laparoscopic   COLONOSCOPY     x3   EYE SURGERY     Left eye 03/02/2018, right 02/15/2018   HIP ARTHROSCOPY W/ LABRAL REPAIR Right 05/11/2013   acetabular labral tear 03/30/2013   KNEE ARTHROPLASTY Right    KNEE JOINT MANIPULATION Left    x3 under anesthesia   KNEE SURGERY Bilateral 1984   Right ACL, left PCL repair   LITHOTRIPSY  2005   LIVER BIOPSY  2013   normal results.   MASTECTOMY Bilateral    prior to 2009   MOUTH SURGERY     NASAL SEPTUM SURGERY N/A 09/20/2015   by ENT Dr. [name]   OVARIAN CYST SURGERY Left    size of grapefruit, was informed that she had shortened vagina   SHOULDER SURGERY Bilateral    Right 08/15/2016, Left 11/15/2016   THUMB ARTHROSCOPY Left    THYROIDECTOMY, PARTIAL Left 2008   TOTAL KNEE ARTHROPLASTY Right 08/23/2018   Procedure: TOTAL KNEE ARTHROPLASTY;  Surgeon: [name], MD;  Location: [facility];  Service: Orthopedics;  Laterality: Right;   57mn   TOTAL KNEE REVISION Left 02/06/2016   Procedure: LEFT TOTAL KNEE REVISION;  Surgeon: FGaynelle Arabian MD;  Location: [facility];  Service: Orthopedics;  Laterality: Left;   TOTAL KNEE REVISION Left 04/22/2017   Procedure: Left knee polyethylene revision;  Surgeon: AGaynelle Arabian MD;  Location: [facility];  Service: Orthopedics;  Laterality: Left;   UPPER GI ENDOSCOPY  2003   Patient Active Problem List   Diagnosis Date Noted   Spondylosis of lumbar spine 10/03/2020   Pseudophakia of both eyes 05/07/2020   Retinal telangiectasis of both eyes 05/07/2020   Lattice degeneration of peripheral retina, left 09/08/2019   Lattice degeneration, right eye 09/08/2019   Chronic diastolic heart failure ([place] 05/24/2019   Urinary dysfunction 04/12/2019   Osteoarthritis of right knee 08/23/2018   Constipation due to opioid therapy 03/30/2018   SNHL (sensorineural hearing loss) 12/04/2017   Abnormal urinary stream 12/03/2017   Osteoarthritis of carpometacarpal (CMC) joint of thumb 11/30/2017   Muscle weakness 11/17/2017   Transient vision disturbance 11/12/2017   Bilateral hand pain 10/30/2017   Pain of left hip joint 10/09/2017   Gynecomastia 07/10/2017   Iron deficiency anemia 07/05/2017   Spasticity 05/20/2017   Lumbar radiculitis 04/20/2017   Ulnar 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, left 11/28/2016   Idiopathic peripheral neuropathy 11/28/2016   Failed total knee arthroplasty, 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   Long term (current) use of anticoagulants 08/23/2015   Right upper quadrant abdominal pain 08/23/2015   Memory loss 05/10/2015   Gait abnormality 04/07/2015   Alkaline phosphatase elevation 04/07/2015   History of thrombosis 03/26/2015   Medial epicondylitis 02/07/2015   Cognitive decline 12/21/2014   Leukopenia 12/05/2014   Rotator cuff syndrome of right shoulder 10/27/2014   Status post left knee replacement 08/22/2014   Left lateral epicondylitis 08/22/2014   Chronic cerebral ischemia 08/18/2014    Arthrofibrosis of knee joint 08/17/2014   Cubital canal compression syndrome, left 08/17/2014   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/2014   Chronic pain syndrome 04/10/2014   Insomnia 04/10/2014   Chronic non-specific white matter lesions on MRI 04/10/2014   CFS (chronic fatigue syndrome) 04/10/2014   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[place] 03/01/2014   Biceps tendonitis on left 03/01/2014   Polycythemia vera ([place] 12/27/2013   H/O TIA (transient  ischemic attack) and stroke 12/27/2013   Neck pain 12/27/2013   OSA (obstructive sleep apnea) 12/08/2013   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/31/2013   Depression with anxiety 08/04/2013   Protein C deficiency ([place]) 08/01/2013   Post traumatic stress disorder (PTSD) 08/01/2013   Speech abnormality 07/25/2013   Obesity 05/10/2013   Lower extremity edema 05/10/2013   GERD (gastroesophageal reflux disease) 05/10/2013   Arthritis 05/10/2013   OA (osteoarthritis) of knee 03/15/2013   Headache 10/25/2012   Palpitations 10/18/2012   Fatty liver determined by biopsy 06/01/2012   Arthropathic psoriasis, unspecified ([place]) 04/29/2012   Abnormal liver enzymes 03/29/2012   Psoriatic arthritis ([place]) 12/29/2011   Left Renal Hydronephrosis 12/11/2010   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    THERAPY DIAG:  Cervicalgia  Abnormal posture  Cramp and spasm  Rationale for Evaluation and Treatment Rehabilitation  ONSET DATE: July/August  SUBJECTIVE:                                                                                                                                                                                                          SUBJECTIVE STATEMENT: Pt presents to PT with complaints of Lt middle digit numbness that began in August 2023 and Rt hand is numb. Pt has a complex medical history including a cyst in cervical spine, bil LE neuropathy, bil knee replacements, power wheelchair use in the community, and CVA with Rt sided weakness.    PERTINENT HISTORY:  Lumbar stenosis L5-S1, failed TKA 2019, neuropathy in bil feet, PTSD, CVA vs TIA Rt weakness, head trauma 2019, Rt thumb replacement, bil total knee replacements, bil shoulder scope surgeries.   PAIN:  Are you having pain? Yes: NPRS scale: 7/10 Pain location: neck (7/10), shoulders 4/10 Pain description: neck (dull and aching), arms (dull, tingling, numb) Aggravating factors: looking up/down Relieving factors: medication  PRECAUTIONS: Fall  WEIGHT BEARING RESTRICTIONS No  FALLS:  Has patient fallen in last 6 months? Yes. Number of falls: 2- 09/12/21 and 10/25/21 per pt report.  Pt has bil neuropathy and balance deficits are chronic.  LIVING ENVIRONMENT: Lives with: lives with their family Lives in: House/apartment Stairs: Yes: Internal: 13 steps; also has ramp Has following equipment at home: Wheelchair (power)  OCCUPATION: on disability   PLOF: Independent  PATIENT GOALS reduce pain, reduce UE pain, turn head with driving, improve use of arms   OBJECTIVE:   DIAGNOSTIC FINDINGS:  MRI on 10/24/21: No interval change of the syrinx (fluid filled cyst) spanning the C4 through C6 levels measuring up to 4 mm in diameter. 2. Mild degenerative changes of the cervical spine without high-grade spinal canal stenosis at any level. 3. Mild to moderate left neural foraminal narrowing at C3-4, unchanged.  PATIENT SURVEYS:  FOTO 53 (52 is goal)  COGNITION: Overall cognitive status: Within functional limits for tasks assessed SENSATION: WFL  POSTURE: rounded shoulders and forward head  PALPATION: Trigger  points in bil suboccipitals, upper traps and tension in bil cervical paraspinals.  Pt with reduced segmental mobility C3-7 and T1-5.  Pain reduction with manual cervical traction in supine.    CERVICAL ROM:   Active ROM A/PROM (deg) eval  Flexion 50  Extension 30  Right lateral flexion 35  Left lateral flexion 40  Right rotation 60  Left rotation 70   (Blank rows = not tested)  UPPER EXTREMITY ROM:  Bil UE A/ROM limited by 25% in all directions.   UPPER EXTREMITY MMT:  MMT Right eval Left eval  Shoulder flexion 4-/5 4/5  Shoulder extension 4- 4  Shoulder abduction 4- 4-  Shoulder adduction    Shoulder extension    Shoulder internal rotation 4+ 4+  Shoulder external rotation 4- 4-  Middle trapezius    Lower trapezius    Elbow flexion    Elbow extension    Wrist flexion    Wrist extension    Wrist ulnar deviation    Wrist radial deviation    Wrist pronation    Wrist supination    Grip strength 32 29   (Blank rows = not tested)  TODAY'S TREATMENT:  Date: 10/30/21 HEP established-see below    PATIENT EDUCATION:  Education details: Access Code: [email or web address] Person educated: Patient and Parent Education method: Explanation, Media planner, and Handouts Education comprehension: verbalized understanding and returned demonstration   HOME EXERCISE PROGRAM: Access Code: [email or web address] URL: [email or web address] Date: 10/30/2021 Prepared by: Claiborne Billings  Exercises - Seated Cervical Flexion AROM  - 3 x daily - 7 x weekly - 1 sets - 3 reps - 20 hold - Seated Cervical Sidebending AROM  - 3 x daily - 7 x weekly - 1 sets - 3 reps - 20 hold - Seated Cervical Rotation AROM  - 3 x daily - 7 x weekly - 1 sets - 3 reps - 20 hold - Seated Correct Posture  - 1 x daily - 7 x weekly - 3 sets - 10 reps - Seated Scapular Retraction  - 5 x daily - 7 x weekly - 1 sets - 10 reps - 5 hold  ASSESSMENT:  CLINICAL IMPRESSION: Patient is a 56 y.o. male who was seen today for physical  therapy evaluation and treatment for neck pain and bil UE radiculopathy that began 4-6 weeks ago. Recent MRI showed Cervical spondylosis with disc disease and facet arthropathy especially in mid levels. Pt has a complex medical history including syrinx (fluid filled cyst) spanning the C4 through C6, CVA with Rt weakness, bil LE neuropathy that limits community ambulation, bil knee replacement surgeries and bil shoulder surgeries.  Pt reports 7/10 neck pain and 4/10 bil UE pain and numbness.  Pt demonstrates forward head and rounded shoulder posture and requires max verbal cuing during evaluation to improve alignment.  Rt>Lt UE strength  deficits, reduced cervical A/ROM and UE A/ROM deficits that are chronic.  Reduced cervical mobility, tension and trigger points in the cervical and thoracic region.  Patient will benefit from skilled PT to address the below impairments and improve overall function.    OBJECTIVE IMPAIRMENTS decreased mobility, decreased ROM, decreased strength, hypomobility, increased muscle spasms, impaired flexibility, impaired UE functional use, postural dysfunction, and pain.   ACTIVITY LIMITATIONS lifting, sitting, and reach over head  PARTICIPATION LIMITATIONS: cleaning, laundry, and driving  PERSONAL FACTORS 3+ comorbidities: cervical DDD, CVA, bil LE neuropathy,   are also affecting patient's functional outcome.   REHAB POTENTIAL: Good  CLINICAL DECISION MAKING: Evolving/moderate complexity  EVALUATION COMPLEXITY: Moderate   GOALS: Goals reviewed with patient? Yes  SHORT TERM GOALS: Target date: 11/27/2021   Be independent in initial HEP Baseline:  Goal status: INITIAL  2.  Verbalize postural modifications to improve alignment and reduce UE symptoms  Baseline:  Goal status: INITIAL  3.  Demonstrate Rt cervical A/ROM rotation to > or = to 70 degrees to improve safety with driving Baseline: 60 Goal status: INITIAL  4.  Report > or = to 30% reduction in neck pain  and UE radiculopathy with ADLs and self-care Baseline: 3-7/10 Goal status: INITIAL   LONG TERM GOALS: Target date: 12/25/2021  Be independent in advanced HEP Baseline:  Goal status: INITIAL  2.  Improve FOTO to > or = to 52  Baseline: 36 Goal status: INITIAL  3.  Improve bil grip to > or = to 45# to improve functional use and endurance with ADLs and self-care Baseline: 32/29 Goal status: INITIAL  4.  Report > or = to 70% reduction in neck pain and UE pain with ADLs and self-care  Baseline: 3-7/10 Goal status: INITIAL  5.  Demonstrate > or = to 80 degrees A/ROM rotation bilaterally to improve safety with driving  Baseline: 57/26 Goal status: INITIAL    PLAN: PT FREQUENCY: 1-2x/week  PT DURATION: 8 weeks  PLANNED INTERVENTIONS: Therapeutic exercises, Therapeutic activity, Neuromuscular re-education, Balance training, Gait training, Patient/Family education, Self Care, Joint mobilization, Joint manipulation, Aquatic Therapy, Dry Needling, Electrical stimulation, Spinal manipulation, Spinal mobilization, Cryotherapy, Moist heat, Taping, Traction, Ultrasound, Manual therapy, and Re-evaluation  PLAN FOR NEXT SESSION: review HEP, DN if pt agrees, cervical traction  Sigurd Sos, PT 10/30/21 12:38 PM   [facility] [address] [place], [place] [address] Phone # [phone] Fax [phone]

## 2021-10-30 ENCOUNTER — Other Ambulatory Visit: Payer: Self-pay

## 2021-10-30 ENCOUNTER — Ambulatory Visit: Payer: 59 | Attending: Physical Medicine & Rehabilitation

## 2021-10-30 DIAGNOSIS — R252 Cramp and spasm: Secondary | ICD-10-CM | POA: Diagnosis present

## 2021-10-30 DIAGNOSIS — R293 Abnormal posture: Secondary | ICD-10-CM | POA: Insufficient documentation

## 2021-10-30 DIAGNOSIS — M542 Cervicalgia: Secondary | ICD-10-CM | POA: Diagnosis present

## 2021-10-30 NOTE — Patient Instructions (Signed)

## 2021-10-31 ENCOUNTER — Other Ambulatory Visit (HOSPITAL_COMMUNITY): Payer: Self-pay

## 2021-10-31 ENCOUNTER — Other Ambulatory Visit (HOSPITAL_BASED_OUTPATIENT_CLINIC_OR_DEPARTMENT_OTHER): Payer: Self-pay

## 2021-10-31 ENCOUNTER — Other Ambulatory Visit (HOSPITAL_BASED_OUTPATIENT_CLINIC_OR_DEPARTMENT_OTHER): Payer: Self-pay | Admitting: Family

## 2021-10-31 ENCOUNTER — Other Ambulatory Visit: Payer: Self-pay | Admitting: Physical Medicine & Rehabilitation

## 2021-10-31 DIAGNOSIS — M47816 Spondylosis without myelopathy or radiculopathy, lumbar region: Secondary | ICD-10-CM

## 2021-10-31 MED ORDER — HYDROXYZINE HCL 25 MG PO TABS
ORAL_TABLET | ORAL | 1 refills | Status: DC
  Filled 2021-10-31: qty 90, 30d supply, fill #0
  Filled 2021-11-21 – 2021-11-22 (×3): qty 90, 30d supply, fill #1

## 2021-10-31 NOTE — Telephone Encounter (Signed)
Pt of Dr. [name]. Please review for refill. Thank you!

## 2021-11-01 ENCOUNTER — Other Ambulatory Visit (HOSPITAL_BASED_OUTPATIENT_CLINIC_OR_DEPARTMENT_OTHER): Payer: Self-pay

## 2021-11-01 ENCOUNTER — Encounter: Payer: Self-pay | Admitting: Physical Medicine & Rehabilitation

## 2021-11-01 MED FILL — Potassium Chloride Tab ER 10 mEq: ORAL | 90 days supply | Qty: 180 | Fill #0 | Status: CN

## 2021-11-04 MED ORDER — MORPHINE SULFATE ER 30 MG PO TBCR
30.0000 mg | EXTENDED_RELEASE_TABLET | Freq: Two times a day (BID) | ORAL | 0 refills | Status: DC
  Filled 2021-11-04 – 2021-11-08 (×3): qty 60, 30d supply, fill #0

## 2021-11-05 ENCOUNTER — Other Ambulatory Visit (HOSPITAL_BASED_OUTPATIENT_CLINIC_OR_DEPARTMENT_OTHER): Payer: Self-pay

## 2021-11-05 ENCOUNTER — Ambulatory Visit (HOSPITAL_COMMUNITY)
Admission: EM | Admit: 2021-11-05 | Discharge: 2021-11-05 | Disposition: A | Discharge status: Home / Self Care | Payer: 59 | Attending: Family Medicine | Admitting: Family Medicine

## 2021-11-05 ENCOUNTER — Other Ambulatory Visit (HOSPITAL_COMMUNITY): Payer: Self-pay

## 2021-11-05 ENCOUNTER — Other Ambulatory Visit: Payer: Self-pay

## 2021-11-05 ENCOUNTER — Encounter (HOSPITAL_BASED_OUTPATIENT_CLINIC_OR_DEPARTMENT_OTHER): Payer: Self-pay | Admitting: Pharmacist

## 2021-11-05 DIAGNOSIS — R208 Other disturbances of skin sensation: Secondary | ICD-10-CM | POA: Diagnosis not present

## 2021-11-05 LAB — COMPREHENSIVE METABOLIC PANEL
ALT: 36 U/L (ref 0–44)
AST: 49 U/L — ABNORMAL HIGH (ref 15–41)
Albumin: 4.3 g/dL (ref 3.5–5.0)
Alkaline Phosphatase: 187 U/L — ABNORMAL HIGH (ref 38–126)
Anion gap: 12 (ref 5–15)
BUN: 18 mg/dL (ref 6–20)
CO2: 28 mmol/L (ref 22–32)
Calcium: 9.3 mg/dL (ref 8.9–10.3)
Chloride: 100 mmol/L (ref 98–111)
Creatinine, Ser: 1.04 mg/dL (ref 0.61–1.24)
GFR, Estimated: >60 mL/min (ref >60)
Glucose, Bld: 86 mg/dL (ref 70–99)
Potassium: 4.5 mmol/L (ref 3.5–5.1)
Sodium: 140 mmol/L (ref 135–145)
Total Bilirubin: 0.7 mg/dL (ref 0.3–1.2)
Total Protein: 6.5 g/dL (ref 6.5–8.1)

## 2021-11-05 LAB — CBC
HCT: 47.2 % (ref 39.0–52.0)
Hemoglobin: 15.3 g/dL (ref 13.0–17.0)
MCH: 26.4 pg (ref 26.0–34.0)
MCHC: 32.4 g/dL (ref 30.0–36.0)
MCV: 81.4 fL (ref 80.0–100.0)
Platelets: 204 10*3/uL (ref 150–400)
RBC: 5.8 MIL/uL (ref 4.22–5.81)
RDW: 17.6 % — ABNORMAL HIGH (ref 11.5–15.5)
WBC: 4 10*3/uL (ref 4.0–10.5)
nRBC: 0 % (ref 0.0–0.2)

## 2021-11-05 LAB — TSH: TSH: 2.206 u[IU]/mL (ref 0.350–4.500)

## 2021-11-05 MED ORDER — FOLIC ACID 1 MG PO TABS
ORAL_TABLET | ORAL | 3 refills | Status: DC
  Filled 2021-11-05 – 2021-11-11 (×3): qty 90, 90d supply, fill #0
  Filled 2022-02-19: qty 90, 90d supply, fill #1
  Filled 2022-05-21: qty 90, 90d supply, fill #2
  Filled 2022-08-17: qty 90, 90d supply, fill #3

## 2021-11-05 MED ORDER — METHOTREXATE 2.5 MG PO TABS
15.0000 mg | ORAL_TABLET | ORAL | 0 refills | Status: DC
  Filled 2021-11-05: qty 72, 84d supply, fill #0

## 2021-11-05 MED ORDER — DEXAMETHASONE SODIUM PHOSPHATE 10 MG/ML IJ SOLN
10.0000 mg | Freq: Once | INTRAMUSCULAR | Status: AC
  Administered 2021-11-05: 10 mg via INTRAMUSCULAR

## 2021-11-05 MED ORDER — DEXAMETHASONE SODIUM PHOSPHATE 10 MG/ML IJ SOLN
INTRAMUSCULAR | Status: AC
  Filled 2021-11-05: qty 1

## 2021-11-05 MED FILL — Potassium Chloride Tab ER 10 mEq: ORAL | 90 days supply | Qty: 180 | Fill #0 | Status: CN

## 2021-11-05 NOTE — Discharge Instructions (Addendum)
You have had labs (blood work) drawn today. We will call you with any significant abnormalities or if there is need to begin or change treatment or pursue further follow up.  You may also review your test results online through [place]. If you do not have a MyChart account, instructions to sign up should be on your discharge paperwork.

## 2021-11-05 NOTE — ED Triage Notes (Signed)
Pt reports he has numbness from nipple line up to face. Pt reports he was sent to [facility] for work up for numbness and dizzyness that started last [place].

## 2021-11-06 NOTE — ED Provider Notes (Addendum)
[facility]   [id number] 11/05/21 Arrival Time: 5993  ASSESSMENT & PLAN:  1. Facial burning    Unclear etiology. No worrisome neurological abnormalities on exam. No suspicion for ICH or SAH. No indication for urgent neurodiagnostic imaging at this time. Discussed.  Labs Reviewed  CBC - Abnormal; Notable for the following components:      Result Value   RDW 17.6 (*)    All other components within normal limits  COMPREHENSIVE METABOLIC PANEL - Abnormal; Notable for the following components:   AST 49 (*)    Alkaline Phosphatase 187 (*)    All other components within normal limits  TSH   Otherwise unremarkable labs. Cannot associated mildly elevated LFT as above with current symptoms. Will recommend he f/u with PCP regarding these.  History of cervical neck pain with disc disease. Given this evening: Meds ordered this encounter  Medications   dexamethasone (DECADRON) injection 10 mg  Cannot promise this will help; he prefers trial.  Questions answered. Outlined signs and symptoms indicating need for more acute intervention. Patient verbalized understanding. After Visit Summary given.   SUBJECTIVE:  Jesse Bowers is a 56 y.o. adult who reports "facial burning"; few days. Started at forehead and has "spread down". With assoc generalized HA; not worst HA of life. Has h/o HAs. No visual changes/n/v. Denies aphasia. Without vertigo or hearing symptoms. Is ambulatory but reports he usu uses a wheelchair to get around. No specific extremity sensation changes or weakness except for mild baseline of LE; this has not changed. Denies coordination problems and seizures. No recent illnesses. Head trauma: denied. No associated SOB, CP, or palpatations reported. Recent travel: none. Reports normal bowel/bladder habits. No tx PTA.  Social History   Substance and Sexual Activity  Alcohol Use Yes   Comment: social   Social History   Tobacco Use  Smoking Status Never  Smokeless  Tobacco Never   Denies recreational drug use.  OBJECTIVE:  Vitals:   11/05/21 1740  BP: (!) 143/82  Pulse: 88  Resp: 20  Temp: 98.8 F (37.1 C)  SpO2: 98%    General appearance: alert; no distress Eyes: PERRLA; EOMI; conjunctiva normal HENT: normocephalic; atraumatic Neck: supple with FROM Lungs: clear to auscultation bilaterally Heart: regular Abdomen: soft Extremities: no cyanosis or edema; symmetrical with no gross deformities Skin: warm and dry Neurologic: normal gait; reports baseline decreased sensation over LLE; CN 2-12 grossly intact Psychological: alert and cooperative; normal mood and affect  Investigations: Results for orders placed or performed during the hospital encounter of 11/05/21  CBC  Result Value Ref Range   WBC 4.0 4.0 - 10.5 K/uL   RBC 5.80 4.22 - 5.81 MIL/uL   Hemoglobin 15.3 13.0 - 17.0 g/dL   HCT 47.2 39.0 - 52.0 %   MCV 81.4 80.0 - 100.0 fL   MCH 26.4 26.0 - 34.0 pg   MCHC 32.4 30.0 - 36.0 g/dL   RDW 17.6 (H) 11.5 - 15.5 %   Platelets 204 150 - 400 K/uL   nRBC 0.0 0.0 - 0.2 %  Comprehensive metabolic panel  Result Value Ref Range   Sodium 140 135 - 145 mmol/L   Potassium 4.5 3.5 - 5.1 mmol/L   Chloride 100 98 - 111 mmol/L   CO2 28 22 - 32 mmol/L   Glucose, Bld 86 70 - 99 mg/dL   BUN 18 6 - 20 mg/dL   Creatinine, Ser 1.04 0.61 - 1.24 mg/dL   Calcium 9.3 8.9 - 10.3 mg/dL  Total Protein 6.5 6.5 - 8.1 g/dL   Albumin 4.3 3.5 - 5.0 g/dL   AST 49 (H) 15 - 41 U/L   ALT 36 0 - 44 U/L   Alkaline Phosphatase 187 (H) 38 - 126 U/L   Total Bilirubin 0.7 0.3 - 1.2 mg/dL   GFR, Estimated >60 >60 mL/min   Anion gap 12 5 - 15  TSH  Result Value Ref Range   TSH 2.206 0.350 - 4.500 uIU/mL   *Note: Due to a large number of results and/or encounters for the requested time period, some results have not been displayed. A complete set of results can be found in Results Review.   Labs Reviewed  CBC - Abnormal; Notable for the following components:       Result Value   RDW 17.6 (*)    All other components within normal limits  COMPREHENSIVE METABOLIC PANEL - Abnormal; Notable for the following components:   AST 49 (*)    Alkaline Phosphatase 187 (*)    All other components within normal limits  TSH   No results found.  Allergies  Allergen Reactions   Gabapentin Nausea Only and Other (See Comments)    Other reaction(s): nausea, mental status Drowsiness and restlessness. Pt. States, " It makes me crazy, I can't take this medicine." Other reaction(s): Other (See Comments) Tardive dyskinesia   Penicillin G Anaphylaxis and Other (See Comments)    Has patient had a PCN reaction causing immediate rash, facial/tongue/throat swelling, SOB or lightheadedness with hypotension: Yes Has patient had a PCN reaction causing severe rash involving mucus membranes or skin necrosis: No Has patient had a PCN reaction that required hospitalization Yes Has patient had a PCN reaction occurring within the last 10 years: No If all of the above answers are "NO", then may proceed with Cephalosporin use.    Sulfa Antibiotics Rash    Stevens-Johnson rash   Nortriptyline Other (See Comments)    Dry mouth at 25 mg dose.  Tolerates 10 mg dose Other reaction(s): Other (See Comments) Dry Mouth   Pregabalin Other (See Comments)    Ineffective Other reaction(s): Other (See Comments) Tardive dyskinesia   Tegaderm Ag Mesh [Silver] Dermatitis    Causes blistering wounds    Ibuprofen Other (See Comments)    Contraindicated with Xarelto.    Sulfacetamide Sodium-Sulfur Rash    Past Medical History:  Diagnosis Date   Abnormal weight loss    Anxiety    Arthritis    Cataract    OU   Celiac disease    Cervical neck pain with evidence of disc disease    patient has a cyst    Chronic constipation    Chronic diastolic heart failure (HCC)    Pt. 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   Family history of adverse reaction to anesthesia    family has problems with anesthesia of nausea and vomiting    GERD (gastroesophageal reflux disease)    History of in 20's   Gluten enteropathy    H/O parotitis    right    Hard of hearing    History of kidney stones    History of retinal tear    Bilateral  History of staph infection    required wound vac   Hx-TIA (transient ischemic attack)    2015   Kidney stones 08/2020   LVH (left ventricular hypertrophy) 12/15/2016   Mild, noted on ECHO   MVP (mitral valve prolapse)    NAFL (nonalcoholic fatty liver)    Neck pain    Neuromuscular disorder (HCC)    bilateral neuropathy feet.   Nuclear sclerotic cataract of both eyes 09/08/2019   Pneumonia 12/17/2010   Polycythemia    Polycythemia, secondary    PONV (postoperative nausea and vomiting)    Protein C deficiency (HCC)    Dr. Anne Fu   Psoriasis    16 X10 cm psoriatic rash on sole of left foot ; open and occ scant bleeding;    psoriatic arthritis    PTSD (post-traumatic stress disorder)    Scaphoid fracture of wrist 09/23/2013   Seizure ([place])    childhood, medication until age 71 then weanned completely off   Sleep apnea    split night study last done by Dr. 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    Wears glasses    Wears hearing aid    Social History   Socioeconomic History   Marital  status: Married    Spouse name: Not on file   Number of children: 2   Years of education: 4y college   Highest education level: Not on file  Occupational History   Occupation: Pediatric Nurse practitioner    Comment: Not working since CVA 2015  Tobacco Use   Smoking status: Never   Smokeless tobacco: Never  Vaping Use   Vaping Use: Never used  Substance and Sexual Activity   Alcohol use: Yes    Comment: social   Drug use: No   Sexual activity: Yes    Birth control/protection: None    Comment: patient is a transgender on testosterone shots, no biological kids  Other Topics Concern   Not on file  Social History Narrative   Education 4 year college, former Therapist, sports X 15 years, pediatric nurse practitioner x 6 years, did NP degree from [employer] [place]. Relocated to [place] about 2 months ago from [place]. Patient was in [place] for last 4 years and prior to that in [place]. His wife is working as Scientist, research (physical sciences) for [employer]. Patient is not working and applying for disability. They have 2 kids but no biologic children.    Social Determinants of Health   Financial Resource Strain: Not on file  Food Insecurity: Not on file  Transportation Needs: Not on file  Physical Activity: Not on file  Stress: Not on file  Social Connections: Not on file  Intimate Partner Violence: Not on file   Family History  Problem Relation Age of [place]   Stroke Maternal Grandfather        30   Heart attack Maternal Grandfather    Glaucoma Maternal Grandfather    Macular degeneration Maternal Grandfather    Breast  cancer Sister    Hypertension Mother    Psoriasis Mother    Other Mother        meningioma developed ~2019   Glaucoma Mother    Cancer Paternal Grandfather    Heart attack Paternal Grandfather    Stroke Paternal Uncle        age 48   Polycythemia Paternal Uncle    Stroke Maternal Grandmother    Congestive Heart Failure Maternal Grandmother    Heart attack Maternal Grandmother     Protein C deficiency Sister 93       Miscarriages   Breast cancer Maternal Aunt 73   Past Surgical History:  Procedure Laterality Date    HYSTERECTOMY Bilateral 1994   TAH, BSO- tranverse incision at 56 yo   ANKLE ARTHROSCOPY WITH RECONSTRUCTION Right 2007   CHOLECYSTECTOMY     laparoscopic   COLONOSCOPY     x3   EYE SURGERY     Left eye 03/02/2018, right 02/15/2018   HIP ARTHROSCOPY W/ LABRAL REPAIR Right 05/11/2013   acetabular labral tear 03/30/2013   KNEE ARTHROPLASTY Right    KNEE JOINT MANIPULATION Left    x3 under anesthesia   KNEE SURGERY Bilateral 1984   Right ACL, left PCL repair   LITHOTRIPSY  2005   LIVER BIOPSY  2013   normal results.   MASTECTOMY Bilateral    prior to 2009   MOUTH SURGERY     NASAL SEPTUM SURGERY N/A 09/20/2015   by ENT Dr. [name]   OVARIAN CYST SURGERY Left    size of grapefruit, was informed that she had shortened vagina   SHOULDER SURGERY Bilateral    Right 08/15/2016, Left 11/15/2016   THUMB ARTHROSCOPY Left    THYROIDECTOMY, PARTIAL Left 2008   TOTAL KNEE ARTHROPLASTY Right 08/23/2018   Procedure: TOTAL KNEE ARTHROPLASTY;  Surgeon: [name], MD;  Location: [facility];  Service: Orthopedics;  Laterality: Right;  22mn   TOTAL KNEE REVISION Left 02/06/2016   Procedure: LEFT TOTAL KNEE REVISION;  Surgeon: FGaynelle Arabian MD;  Location: [facility];  Service: Orthopedics;  Laterality: Left;   TOTAL KNEE REVISION Left 04/22/2017   Procedure: Left knee polyethylene revision;  Surgeon: AGaynelle Arabian MD;  Location: [facility];  Service: Orthopedics;  Laterality: Left;   UPPER GI ENDOSCOPY  2003       HVanessa Kick MD 11/06/21 0[place] Pastillo MD 04/08/22 1241

## 2021-11-08 ENCOUNTER — Other Ambulatory Visit (HOSPITAL_BASED_OUTPATIENT_CLINIC_OR_DEPARTMENT_OTHER): Payer: Self-pay

## 2021-11-08 ENCOUNTER — Ambulatory Visit (INDEPENDENT_AMBULATORY_CARE_PROVIDER_SITE_OTHER): Payer: 59 | Admitting: Psychologist

## 2021-11-08 DIAGNOSIS — F411 Generalized anxiety disorder: Secondary | ICD-10-CM | POA: Diagnosis not present

## 2021-11-08 DIAGNOSIS — F331 Major depressive disorder, recurrent, moderate: Secondary | ICD-10-CM | POA: Diagnosis not present

## 2021-11-08 NOTE — Progress Notes (Signed)
[facility] Counselor/Therapist Progress Note  Patient ID: Jesse Bowers, MRN: [id number],    Date: 11/08/2021  Time Spent: 03:10 pm to 03:45 pm; total time: 35 minutes   This session was held via video webex teletherapy due to the coronavirus risk at this time. The patient consented to video teletherapy and was located at his home during this session. He is aware it is the responsibility of the patient to secure confidentiality on his end of the session. The provider was in a private home office for the duration of this session. Limits of confidentiality were discussed with the patient.   Treatment Type: Individual Therapy  Reported Symptoms: Experiencing stress related to thoughts  Mental Status Exam: Appearance:  Casual     Behavior: Appropriate  Motor: Normal  Speech/Language:  Normal Rate  Affect: Appropriate  Mood: normal  Thought process: normal  Thought content:   WNL  Sensory/Perceptual disturbances:   WNL  Orientation: oriented to person, place, and time/date  Attention: Good  Concentration: Good  Memory: WNL  Fund of knowledge:  Good  Insight:   Fair  Judgment:  Fair  Impulse Control: Good   Risk Assessment: Danger to Self:  No Self-injurious Behavior: No Danger to Others: No Duty to Warn:no Physical Aggression / Violence:No  Access to Firearms a concern: No  Gang Involvement:No   Subjective: Patient began the session the patient stating that he was okay while talking about events in his life. Patient voiced distress over thoughts that he experiences and stated that he wants to "change" his thoughts. Patient was open to a different approach of releasing thoughts. After participating in defusion he described himself as better. He asked to follow up. He denied suicidal and homicidal ideation.    Interventions:  Worked on developing a therapeutic relationship with the patient. Used active listening and reflective statements. Provided emotional support  using empathy and validation. Reviewed events since the last session. Praised the patient for doing slightly better. Normalized and validated expressed thoughts and emotions. Explored different ways to experience thoughts. Reviewed the concept of defusion. Practiced defusion. Praised patient for experiencing less distress. Assigned homework. Provided empathic statements. Assessed for suicidal and homicidal ideation.  Homework: Implement defusion  Next Session: Emotional support and review homework.   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 disorder, recurrent, moderate   Plan:   Goals Alleviate depressive symptoms Recognize, accept, and cope with depressive feelings Develop healthy thinking patterns Develop healthy interpersonal relationships Reduce overall frequency, intensity, and duration of anxiety Stabilize anxiety level wile increasing ability to function Enhance ability to effectively cope with full variety of stressors Learn and implement coping skills that result in a reduction of anxiety   Objectives target date for all objectives is: 09/27/2022 Verbalize an understanding of the cognitive, physiological, and behavioral components of anxiety Learning and implement calming skills to reduce overall anxiety Verbalize an understanding of the role that cognitive biases play in excessive irrational worry and persistent anxiety symptoms Identify, challenge, and replace based fearful talk Learn and implement problem solving strategies Identify and engage in pleasant activities Learning and implement personal and interpersonal skills to reduce anxiety and improve interpersonal relationships Learn to accept limitations in life and commit to tolerating, rather than avoiding, unpleasant emotions while accomplishing meaningful goals Identify major life conflicts from the past and present that form the basis for present anxiety Maintain involvement  in work, family, and social activities Reestablish a consistent sleep-wake cycle Cooperate with a medical  evaluation  Cooperate with a medication evaluation by a physician Verbalize an accurate understanding of depression Verbalize an understanding of the treatment Identify and replace thoughts that support depression Learn and implement behavioral strategies Verbalize an understanding and resolution of current interpersonal problems Learn and implement problem solving and decision making skills Learn and implement conflict resolution skills to resolve interpersonal problems Verbalize an understanding of healthy and unhealthy emotions verbalize insight into how past relationships may be influence current experiences with depression Use mindfulness and acceptance strategies and increase value based behavior  Increase hopeful statements about the future.  Interventions Engage the patient in behavioral activation Use instruction, modeling, and role-playing to build the client's general social, communication, and/or conflict resolution skills Use Acceptance and Commitment Therapy to help client accept uncomfortable realities in order to accomplish value-consistent goals Reinforce the client's insight into the role of his/her past emotional pain and present anxiety  Support the client in following through with work, family, and social activities Teach and implement sleep hygiene practices  Refer the patient to a physician for a psychotropic medication consultation Monito the clint's psychotropic medication compliance Discuss how anxiety typically involves excessive worry, various bodily expressions of tension, and avoidance of what is threatening that interact to maintain the problem  Teach the patient relaxation skills Assign the patient homework Discuss examples demonstrating that unrealistic worry overestimates the probability of threats and underestimates patient's ability  Assist the  patient in analyzing his or her worries Help patient understand that avoidance is reinforcing  Consistent with treatment model, discuss how change in cognitive, behavioral, and interpersonal can help client alleviate depression CBT Behavioral activation help the client explore the relationship, nature of the dispute,  Help the client develop new interpersonal skills and relationships Conduct Problem solving therapy Teach conflict resolution skills Use a process-experiential approach Conduct TLDP Conduct ACT Evaluate need for psychotropic medication Monitor adherence to medication   Patient agreed and reviewed the treatment plan on 10/03/2020. Reassessed the treatment plan. Patient approved of the reassessment on 09/25/2021.   Conception Chancy, PsyD

## 2021-11-11 ENCOUNTER — Encounter: Payer: Self-pay | Admitting: Physical Therapy

## 2021-11-11 ENCOUNTER — Ambulatory Visit: Payer: 59 | Attending: Physical Medicine & Rehabilitation | Admitting: Physical Therapy

## 2021-11-11 ENCOUNTER — Other Ambulatory Visit (HOSPITAL_BASED_OUTPATIENT_CLINIC_OR_DEPARTMENT_OTHER): Payer: Self-pay

## 2021-11-11 DIAGNOSIS — M542 Cervicalgia: Secondary | ICD-10-CM | POA: Diagnosis present

## 2021-11-11 DIAGNOSIS — R293 Abnormal posture: Secondary | ICD-10-CM | POA: Insufficient documentation

## 2021-11-11 DIAGNOSIS — R252 Cramp and spasm: Secondary | ICD-10-CM | POA: Diagnosis present

## 2021-11-11 NOTE — Therapy (Signed)
OUTPATIENT PHYSICAL THERAPY TREATMENT NOTE   Patient Name: Jesse Bowers MRN: [id number] DOB:10-28-1965, 56 y.o., adult Today's Date: 11/11/2021  PCP: NA-pt reports that he does not currently have a PCP REFERRING PROVIDER: Alger Simons, MD  END OF SESSION:   PT End of Session - 11/11/21 1235     Visit Number 2    Date for PT Re-Evaluation 12/25/21    Authorization Type UMR    PT Start Time 1234    PT Stop Time 1306    PT Time Calculation (min) 32 min    Activity Tolerance Patient tolerated treatment well    Behavior During Therapy WFL for tasks assessed/performed             Past Medical History:  Diagnosis Date   Abnormal weight loss    Anxiety    Arthritis    Cataract    OU   Celiac disease    Cervical neck pain with evidence of disc disease    patient has a cyst    Chronic constipation    Chronic diastolic heart failure ([place])    Pt. 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   Family history of adverse reaction to anesthesia    family has problems with anesthesia of nausea and vomiting    GERD (gastroesophageal reflux disease)    History of in 20's   Gluten enteropathy    H/O parotitis    right    Hard of hearing    History of kidney stones    History of retinal tear    Bilateral   History of staph infection    required wound vac   Hx-TIA (transient ischemic attack)    2015   Kidney stones 08/2020   LVH (left ventricular hypertrophy) 12/15/2016   Mild, noted on ECHO   MVP (mitral valve prolapse)    NAFL (nonalcoholic fatty liver)    Neck pain    Neuromuscular disorder ([place])    bilateral neuropathy feet.   Nuclear sclerotic cataract of both eyes 09/08/2019   Pneumonia 12/17/2010   Polycythemia    Polycythemia, secondary     PONV (postoperative nausea and vomiting)    Protein C deficiency (HCC)    Dr. Anne Fu   Psoriasis    16 X10 cm psoriatic rash on sole of left foot ; open and occ scant bleeding;    psoriatic arthritis    PTSD (post-traumatic stress disorder)    Scaphoid fracture of wrist 09/23/2013   Seizure (Home)    childhood, medication until age 22 then weanned completely off   Sleep apnea    split night study last done by Dr. [name] 06/18/15 shows severe OSA, CSA, and hypersomnia, rec bipap   Splenomegaly    Stenosis of ureteropelvic junction (UPJ)    left   Stroke [facility])    CVA vs TIA in left cerebrum causing slight right sided weakness-Dr. [name] follows   Syrinx of spinal cord ([place]) 01/06/2014   c spine on MRI   Tachycardia    hx of    Transfusion history    past history- none recent, after surgeries due to blood loss   Transgender with history of gender affirmation surgery  Wears glasses    Wears hearing aid    Past Surgical History:  Procedure Laterality Date   ABDOMINAL HYSTERECTOMY Bilateral 1994   TAH, BSO- tranverse incision at 56 yo   ANKLE ARTHROSCOPY WITH RECONSTRUCTION Right 2007   CHOLECYSTECTOMY     laparoscopic   COLONOSCOPY     x3   EYE SURGERY     Left eye 03/02/2018, right 02/15/2018   HIP ARTHROSCOPY W/ LABRAL REPAIR Right 05/11/2013   acetabular labral tear 03/30/2013   KNEE ARTHROPLASTY Right    KNEE JOINT MANIPULATION Left    x3 under anesthesia   KNEE SURGERY Bilateral 1984   Right ACL, left PCL repair   LITHOTRIPSY  2005   LIVER BIOPSY  2013   normal results.   MASTECTOMY Bilateral    prior to 2009   MOUTH SURGERY     NASAL SEPTUM SURGERY N/A 09/20/2015   by ENT Dr. [name]   OVARIAN CYST SURGERY Left    size of grapefruit, was informed that she had shortened vagina   SHOULDER SURGERY Bilateral    Right 08/15/2016, Left 11/15/2016   THUMB ARTHROSCOPY Left    THYROIDECTOMY, PARTIAL Left 2008   TOTAL KNEE ARTHROPLASTY Right 08/23/2018   Procedure:  TOTAL KNEE ARTHROPLASTY;  Surgeon: [name], MD;  Location: [facility];  Service: Orthopedics;  Laterality: Right;  19mn   TOTAL KNEE REVISION Left 02/06/2016   Procedure: LEFT TOTAL KNEE REVISION;  Surgeon: FGaynelle Arabian MD;  Location: [facility];  Service: Orthopedics;  Laterality: Left;   TOTAL KNEE REVISION Left 04/22/2017   Procedure: Left knee polyethylene revision;  Surgeon: AGaynelle Arabian MD;  Location: [facility];  Service: Orthopedics;  Laterality: Left;   UPPER GI ENDOSCOPY  2003   Patient Active Problem List   Diagnosis Date Noted   Spondylosis of lumbar spine 10/03/2020   Pseudophakia of both eyes 05/07/2020   Retinal telangiectasis of both eyes 05/07/2020   Lattice degeneration of peripheral retina, left 09/08/2019   Lattice degeneration, right eye 09/08/2019   Chronic diastolic heart failure ([place] 05/24/2019   Urinary dysfunction 04/12/2019   Osteoarthritis of right knee 08/23/2018   Constipation due to opioid therapy 03/30/2018   SNHL (sensorineural hearing loss) 12/04/2017   Abnormal urinary stream 12/03/2017   Osteoarthritis of carpometacarpal (CMC) joint of thumb 11/30/2017   Muscle weakness 11/17/2017   Transient vision disturbance 11/12/2017   Bilateral hand pain 10/30/2017   Pain of left hip joint 10/09/2017   Gynecomastia 07/10/2017   Iron deficiency anemia 07/05/2017   Spasticity 05/20/2017   Lumbar radiculitis 04/20/2017   Ulnar 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, left 11/28/2016   Idiopathic peripheral neuropathy 11/28/2016   Failed total knee arthroplasty, 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   Long term (current) use of anticoagulants 08/23/2015   Right upper quadrant abdominal pain 08/23/2015   Memory loss 05/10/2015   Gait abnormality 04/07/2015   Alkaline phosphatase elevation 04/07/2015   History of thrombosis 03/26/2015   Medial epicondylitis 02/07/2015   Cognitive decline 12/21/2014   Leukopenia 12/05/2014   Rotator cuff syndrome of right shoulder 10/27/2014   Status post left  knee replacement 08/22/2014   Left lateral epicondylitis 08/22/2014   Chronic cerebral ischemia 08/18/2014   Arthrofibrosis of knee joint 08/17/2014   Cubital canal compression syndrome, left 08/17/2014   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[place] 04/10/2014   Chronic pain syndrome 04/10/2014   Insomnia 04/10/2014   Chronic non-specific white matter lesions on MRI 04/10/2014   CFS (chronic fatigue syndrome) 04/10/2014   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  [place]) 03/01/2014   Biceps tendonitis on left 03/01/2014   Polycythemia vera ([place]) 12/27/2013   H/O TIA (transient ischemic attack) and stroke 12/27/2013   Neck pain 12/27/2013   OSA (obstructive sleep apnea) 12/08/2013   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/31/2013   Depression with anxiety 08/04/2013   Protein C deficiency ([place]) 08/01/2013   Post traumatic stress disorder (PTSD) 08/01/2013   Speech abnormality 07/25/2013   Obesity 05/10/2013   Lower extremity edema 05/10/2013   GERD (gastroesophageal reflux disease) 05/10/2013   Arthritis 05/10/2013   OA (osteoarthritis) of knee 03/15/2013   Headache 10/25/2012   Palpitations 10/18/2012   Fatty liver determined by biopsy 06/01/2012   Arthropathic psoriasis, unspecified ([place]) 04/29/2012   Abnormal liver enzymes 03/29/2012   Psoriatic arthritis ([place]) 12/29/2011   Left Renal Hydronephrosis 12/11/2010   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     THERAPY DIAG:  Cervicalgia  Abnormal posture  Cramp and spasm  Rationale for Evaluation and Treatment Rehabilitation  PERTINENT HISTORY: Lumbar stenosis L5-S1, failed TKA 2019, neuropathy in bil feet, PTSD, CVA vs TIA Rt weakness, head trauma 2019, Rt thumb replacement, bil total knee replacements,  bil shoulder scope surgeries  PRECAUTIONS: Fall  SUBJECTIVE: Not doing cervical AROM exercises due to increased dizziness. Steroid shot helped neck. Remains with what the medical professionals believe to be an irritated nerve that runs from his forehead through the GI tract.   PAIN:  Are you having pain?  Pt reports a warm, kind of like heart burn feeling, that goes from my forehead and down through my GI tract.    OBJECTIVE: (objective measures completed at initial evaluation unless otherwise dated)  DIAGNOSTIC FINDINGS:  MRI on 10/24/21: No interval change of the syrinx (fluid filled cyst) spanning the C4 through C6 levels measuring up to 4 mm in diameter. 2. Mild degenerative changes of the cervical spine without high-grade spinal canal stenosis at any level. 3. Mild to moderate left neural foraminal narrowing at C3-4, unchanged.   PATIENT SURVEYS:  FOTO 25 (52 is goal)   COGNITION: Overall cognitive status: Within functional limits for tasks assessed SENSATION: WFL   POSTURE: rounded shoulders and forward head   PALPATION: Trigger points in bil suboccipitals, upper traps and tension in bil cervical paraspinals.  Pt with reduced segmental mobility C3-7 and T1-5.  Pain reduction with manual cervical traction in supine.              CERVICAL ROM:    Active ROM A/PROM (deg) eval A/ROM 11/11/21  Flexion 50   Extension 30   Right lateral flexion 35   Left lateral flexion 40   Right rotation 60 65  Left rotation 70 80   (Blank rows = not tested)   UPPER EXTREMITY ROM:   Bil UE A/ROM limited by 25% in all directions.    UPPER EXTREMITY MMT:   MMT Right eval Left eval  Shoulder flexion 4-/5 4/5  Shoulder extension 4- 4  Shoulder abduction 4- 4-  Shoulder adduction      Shoulder extension      Shoulder internal rotation 4+ 4+  Shoulder external rotation 4- 4-  Middle trapezius      Lower trapezius      Elbow flexion      Elbow extension  Wrist flexion       Wrist extension      Wrist ulnar deviation      Wrist radial deviation      Wrist pronation      Wrist supination      Grip strength 32 29   (Blank rows = not tested)   TODAY'S TREATMENT:    11/11/21: Posture education:  Seated:yellow band ER 10x, horizontal abd 10x, scap squeezes 5x 3 way shoulder raises 10x 0# Posture review/education  Date: 10/30/21 HEP established-see below      PATIENT EDUCATION:  Education details: Access Code: [email or web address] Person educated: Patient and Parent Education method: Explanation, Demonstration, and Handouts Education comprehension: verbalized understanding and returned demonstration     HOME EXERCISE PROGRAM: Access Code: [email or web address] URL: [email or web address] Date: 10/30/2021 Prepared by: Claiborne Billings   Exercises - Seated Cervical Flexion AROM  - 3 x daily - 7 x weekly - 1 sets - 3 reps - 20 hold - Seated Cervical Sidebending AROM  - 3 x daily - 7 x weekly - 1 sets - 3 reps - 20 hold - Seated Cervical Rotation AROM  - 3 x daily - 7 x weekly - 1 sets - 3 reps - 20 hold - Seated Correct Posture  - 1 x daily - 7 x weekly - 3 sets - 10 reps - Seated Scapular Retraction  - 5 x daily - 7 x weekly - 1 sets - 10 reps - 5 hold   ASSESSMENT:   CLINICAL IMPRESSION: Pt reports he is not doing the cervical AROM exercises because they continue to induce dizziness. Pt got a cortisone shot and it helped his neck pain and could rotate better today. Added low resistive band exercises for HEP for postural strength and endurance. Pt was also re-educated in posture awareness.      OBJECTIVE IMPAIRMENTS decreased mobility, decreased ROM, decreased strength, hypomobility, increased muscle spasms, impaired flexibility, impaired UE functional use, postural dysfunction, and pain.    ACTIVITY LIMITATIONS lifting, sitting, and reach over head   PARTICIPATION LIMITATIONS: cleaning, laundry, and driving   PERSONAL FACTORS 3+ comorbidities: cervical DDD, CVA,  bil LE neuropathy,   are also affecting patient's functional outcome.    REHAB POTENTIAL: Good   CLINICAL DECISION MAKING: Evolving/moderate complexity   EVALUATION COMPLEXITY: Moderate     GOALS: Goals reviewed with patient? Yes   SHORT TERM GOALS: Target date: 11/27/2021    Be independent in initial HEP Baseline:  Goal status: INITIAL   2.  Verbalize postural modifications to improve alignment and reduce UE symptoms  Baseline:  Goal status: INITIAL   3.  Demonstrate Rt cervical A/ROM rotation to > or = to 70 degrees to improve safety with driving Baseline: 60 Goal status: INITIAL   4.  Report > or = to 30% reduction in neck pain and UE radiculopathy with ADLs and self-care Baseline: 3-7/10 Goal status: INITIAL     LONG TERM GOALS: Target date: 12/25/2021   Be independent in advanced HEP Baseline:  Goal status: INITIAL   2.  Improve FOTO to > or = to 52  Baseline: 36 Goal status: INITIAL   3.  Improve bil grip to > or = to 45# to improve functional use and endurance with ADLs and self-care Baseline: 32/29 Goal status: INITIAL   4.  Report > or = to 70% reduction in neck pain and UE pain with ADLs and self-care  Baseline: 3-7/10 Goal status: INITIAL   5.  Demonstrate > or =  to 80 degrees A/ROM rotation bilaterally to improve safety with driving  Baseline: 46/50 Goal status: INITIAL       PLAN: PT FREQUENCY: 1-2x/week   PT DURATION: 8 weeks   PLANNED INTERVENTIONS: Therapeutic exercises, Therapeutic activity, Neuromuscular re-education, Balance training, Gait training, Patient/Family education, Self Care, Joint mobilization, Joint manipulation, Aquatic Therapy, Dry Needling, Electrical stimulation, Spinal manipulation, Spinal mobilization, Cryotherapy, Moist heat, Taping, Traction, Ultrasound, Manual therapy, and Re-evaluation   PLAN FOR NEXT SESSION: review new band exercises, DN if pt agrees, consider cervical traction     Aela Bohan,  PTA 11/11/2021, 2:03 PM

## 2021-11-11 NOTE — Therapy (Deleted)
OUTPATIENT PHYSICAL THERAPY CERVICAL EVALUATION   Patient Name: Jesse Bowers MRN: [id number] DOB:April 10, 1965, 56 y.o., adult Today's Date: 11/11/2021   PT End of Session - 11/11/21 1235     Visit Number 2    Date for PT Re-Evaluation 12/25/21    Authorization Type UMR    PT Start Time 1234    Activity Tolerance Patient tolerated treatment well    Behavior During Therapy [facility] for tasks assessed/performed             Past Medical History:  Diagnosis Date   Abnormal weight loss    Anxiety    Arthritis    Cataract    OU   Celiac disease    Cervical neck pain with evidence of disc disease    patient has a cyst    Chronic constipation    Chronic diastolic heart failure (HCC)    Pt. 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   Family history of adverse reaction to anesthesia    family has problems with anesthesia of nausea and vomiting    GERD (gastroesophageal reflux disease)    History of in 20's   Gluten enteropathy    H/O parotitis    right    Hard of hearing    History of kidney stones    History of retinal tear    Bilateral   History of staph infection    required wound vac   Hx-TIA (transient ischemic attack)    2015   Kidney stones 08/2020   LVH (left ventricular hypertrophy) 12/15/2016   Mild, noted on ECHO   MVP (mitral valve prolapse)    NAFL (nonalcoholic fatty liver)    Neck pain    Neuromuscular disorder ([place])    bilateral neuropathy feet.   Nuclear sclerotic cataract of both eyes 09/08/2019   Pneumonia 12/17/2010   Polycythemia    Polycythemia, secondary    PONV (postoperative nausea and vomiting)    Protein C deficiency (HCC)    Dr. Anne Fu   Psoriasis    16 X10 cm psoriatic rash on sole of left foot ; open and occ scant  bleeding;    psoriatic arthritis    PTSD (post-traumatic stress disorder)    Scaphoid fracture of wrist 09/23/2013   Seizure ([place])    childhood, medication until age 29 then weanned completely off   Sleep apnea    split night study last done by Dr. [name] 06/18/15 shows severe OSA, CSA, and hypersomnia, rec bipap   Splenomegaly    Stenosis of ureteropelvic junction (UPJ)    left   Stroke [facility])    CVA vs TIA in left cerebrum causing slight right sided weakness-Dr. [name] follows   Syrinx of spinal cord ([place]) 01/06/2014   c spine on MRI   Tachycardia    hx of    Transfusion history    past history- none recent, after surgeries due to blood loss   Transgender with history of gender affirmation surgery    Wears glasses    Wears hearing aid    Past Surgical History:  Procedure Laterality Date   ABDOMINAL HYSTERECTOMY Bilateral 1994   TAH, BSO- tranverse incision at 56 yo   ANKLE  ARTHROSCOPY WITH RECONSTRUCTION Right 2007   CHOLECYSTECTOMY     laparoscopic   COLONOSCOPY     x3   EYE SURGERY     Left eye 03/02/2018, right 02/15/2018   HIP ARTHROSCOPY W/ LABRAL REPAIR Right 05/11/2013   acetabular labral tear 03/30/2013   KNEE ARTHROPLASTY Right    KNEE JOINT MANIPULATION Left    x3 under anesthesia   KNEE SURGERY Bilateral 1984   Right ACL, left PCL repair   LITHOTRIPSY  2005   LIVER BIOPSY  2013   normal results.   MASTECTOMY Bilateral    prior to 2009   MOUTH SURGERY     NASAL SEPTUM SURGERY N/A 09/20/2015   by ENT Dr. [name]   OVARIAN CYST SURGERY Left    size of grapefruit, was informed that she had shortened vagina   SHOULDER SURGERY Bilateral    Right 08/15/2016, Left 11/15/2016   THUMB ARTHROSCOPY Left    THYROIDECTOMY, PARTIAL Left 2008   TOTAL KNEE ARTHROPLASTY Right 08/23/2018   Procedure: TOTAL KNEE ARTHROPLASTY;  Surgeon: [name], MD;  Location: [facility];  Service: Orthopedics;  Laterality: Right;  89mn   TOTAL KNEE REVISION Left 02/06/2016   Procedure:  LEFT TOTAL KNEE REVISION;  Surgeon: FGaynelle Arabian MD;  Location: [facility];  Service: Orthopedics;  Laterality: Left;   TOTAL KNEE REVISION Left 04/22/2017   Procedure: Left knee polyethylene revision;  Surgeon: AGaynelle Arabian MD;  Location: [facility];  Service: Orthopedics;  Laterality: Left;   UPPER GI ENDOSCOPY  2003   Patient Active Problem List   Diagnosis Date Noted   Spondylosis of lumbar spine 10/03/2020   Pseudophakia of both eyes 05/07/2020   Retinal telangiectasis of both eyes 05/07/2020   Lattice degeneration of peripheral retina, left 09/08/2019   Lattice degeneration, right eye 09/08/2019   Chronic diastolic heart failure ([place] 05/24/2019   Urinary dysfunction 04/12/2019   Osteoarthritis of right knee 08/23/2018   Constipation due to opioid therapy 03/30/2018   SNHL (sensorineural hearing loss) 12/04/2017   Abnormal urinary stream 12/03/2017   Osteoarthritis of carpometacarpal (CMC) joint of thumb 11/30/2017   Muscle weakness 11/17/2017   Transient vision disturbance 11/12/2017   Bilateral hand pain 10/30/2017   Pain of left hip joint 10/09/2017   Gynecomastia 07/10/2017   Iron deficiency anemia 07/05/2017   Spasticity 05/20/2017   Lumbar radiculitis 04/20/2017   Ulnar 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, left 11/28/2016   Idiopathic peripheral neuropathy 11/28/2016   Failed total knee arthroplasty, 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   Long term (current) use of anticoagulants 08/23/2015   Right upper quadrant abdominal pain 08/23/2015   Memory loss 05/10/2015   Gait abnormality 04/07/2015   Alkaline phosphatase elevation 04/07/2015   History of thrombosis 03/26/2015   Medial epicondylitis 02/07/2015   Cognitive decline 12/21/2014   Leukopenia 12/05/2014   Rotator cuff syndrome of right shoulder 10/27/2014   Status post left knee replacement 08/22/2014   Left lateral epicondylitis 08/22/2014   Chronic cerebral ischemia 08/18/2014   Arthrofibrosis of knee joint 08/17/2014   Cubital canal  compression syndrome, left 08/17/2014   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[place] 04/10/2014   Chronic pain syndrome 04/10/2014   Insomnia 04/10/2014   Chronic non-specific white matter lesions on MRI 04/10/2014   CFS (chronic fatigue syndrome) 04/10/2014   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[place] 03/01/2014   Biceps tendonitis on left 03/01/2014   Polycythemia vera ([place] 12/27/2013   H/O TIA (transient ischemic attack) and stroke 12/27/2013   Neck pain 12/27/2013   OSA (obstructive sleep apnea)  12/08/2013   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/31/2013   Depression with anxiety 08/04/2013   Protein C deficiency ([place]) 08/01/2013   Post traumatic stress disorder (PTSD) 08/01/2013   Speech abnormality 07/25/2013   Obesity 05/10/2013   Lower extremity edema 05/10/2013   GERD (gastroesophageal reflux disease) 05/10/2013   Arthritis 05/10/2013   OA (osteoarthritis) of knee 03/15/2013   Headache 10/25/2012   Palpitations 10/18/2012   Fatty liver determined by biopsy 06/01/2012   Arthropathic psoriasis, unspecified ([place]) 04/29/2012   Abnormal liver enzymes 03/29/2012   Psoriatic arthritis ([place]) 12/29/2011   Left Renal Hydronephrosis 12/11/2010   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    THERAPY DIAG:  Cervicalgia  Abnormal posture  Cramp and spasm  Rationale for Evaluation and Treatment Rehabilitation  ONSET DATE: July/August  SUBJECTIVE:                                                                                                                                                                                                          SUBJECTIVE STATEMENT: Pt presents to PT with complaints of Lt middle digit numbness that began in August 2023 and Rt hand is numb. Pt has a complex medical history including a cyst in cervical spine, bil LE neuropathy, bil knee replacements, power wheelchair use in the community, and CVA with Rt sided weakness.    PERTINENT HISTORY:  Lumbar stenosis L5-S1, failed TKA 2019, neuropathy in bil feet, PTSD, CVA vs TIA Rt weakness, head trauma 2019, Rt thumb replacement, bil total knee replacements, bil shoulder scope surgeries.   PAIN:  Are you having pain? Yes: NPRS scale: 7/10 Pain location: neck (7/10), shoulders 4/10 Pain description: neck (dull and aching), arms (dull, tingling, numb) Aggravating factors: looking up/down Relieving factors: medication  PRECAUTIONS: Fall  WEIGHT BEARING RESTRICTIONS No  FALLS:  Has patient fallen in last 6 months? Yes. Number of falls: 2- 09/12/21 and 10/25/21 per pt report.  Pt has bil neuropathy and balance deficits are chronic.  LIVING ENVIRONMENT: Lives with: lives with their family Lives in: House/apartment Stairs: Yes: Internal: 13 steps; also has ramp Has following equipment at home: Wheelchair (power)  OCCUPATION: on disability   PLOF: Independent  PATIENT GOALS reduce pain, reduce UE pain,  turn head with driving, improve use of arms   OBJECTIVE:   DIAGNOSTIC FINDINGS:  MRI on 10/24/21: No interval change of the syrinx (fluid filled cyst) spanning the C4 through C6 levels measuring up to 4 mm in diameter. 2. Mild degenerative changes of the cervical spine without high-grade spinal canal stenosis at any level. 3. Mild to moderate left neural foraminal narrowing at C3-4, unchanged.  PATIENT SURVEYS:  FOTO 89 (52 is goal)  COGNITION: Overall cognitive status: Within functional limits for tasks assessed SENSATION: WFL  POSTURE: rounded shoulders and forward head  PALPATION: Trigger points in bil suboccipitals, upper traps and  tension in bil cervical paraspinals.  Pt with reduced segmental mobility C3-7 and T1-5.  Pain reduction with manual cervical traction in supine.    CERVICAL ROM:   Active ROM A/PROM (deg) eval  Flexion 50  Extension 30  Right lateral flexion 35  Left lateral flexion 40  Right rotation 60  Left rotation 70   (Blank rows = not tested)  UPPER EXTREMITY ROM:  Bil UE A/ROM limited by 25% in all directions.   UPPER EXTREMITY MMT:  MMT Right eval Left eval  Shoulder flexion 4-/5 4/5  Shoulder extension 4- 4  Shoulder abduction 4- 4-  Shoulder adduction    Shoulder extension    Shoulder internal rotation 4+ 4+  Shoulder external rotation 4- 4-  Middle trapezius    Lower trapezius    Elbow flexion    Elbow extension    Wrist flexion    Wrist extension    Wrist ulnar deviation    Wrist radial deviation    Wrist pronation    Wrist supination    Grip strength 32 29   (Blank rows = not tested)  TODAY'S TREATMENT:  Date: 10/30/21 HEP established-see below    PATIENT EDUCATION:  Education details: Access Code: [email or web address] Person educated: Patient and Parent Education method: Explanation, Media planner, and Handouts Education comprehension: verbalized understanding and returned demonstration   HOME EXERCISE PROGRAM: Access Code: [email or web address] URL: [email or web address] Date: 10/30/2021 Prepared by: Claiborne Billings  Exercises - Seated Cervical Flexion AROM  - 3 x daily - 7 x weekly - 1 sets - 3 reps - 20 hold - Seated Cervical Sidebending AROM  - 3 x daily - 7 x weekly - 1 sets - 3 reps - 20 hold - Seated Cervical Rotation AROM  - 3 x daily - 7 x weekly - 1 sets - 3 reps - 20 hold - Seated Correct Posture  - 1 x daily - 7 x weekly - 3 sets - 10 reps - Seated Scapular Retraction  - 5 x daily - 7 x weekly - 1 sets - 10 reps - 5 hold  ASSESSMENT:  CLINICAL IMPRESSION: Patient is a 56 y.o. male who was seen today for physical therapy evaluation and treatment for neck pain  and bil UE radiculopathy that began 4-6 weeks ago. Recent MRI showed Cervical spondylosis with disc disease and facet arthropathy especially in mid levels. Pt has a complex medical history including syrinx (fluid filled cyst) spanning the C4 through C6, CVA with Rt weakness, bil LE neuropathy that limits community ambulation, bil knee replacement surgeries and bil shoulder surgeries.  Pt reports 7/10 neck pain and 4/10 bil UE pain and numbness.  Pt demonstrates forward head and rounded shoulder posture and requires max verbal cuing during evaluation to improve alignment.  Rt>Lt UE strength deficits, reduced cervical A/ROM and UE A/ROM deficits that are chronic.  Reduced cervical mobility,  tension and trigger points in the cervical and thoracic region.  Patient will benefit from skilled PT to address the below impairments and improve overall function.    OBJECTIVE IMPAIRMENTS decreased mobility, decreased ROM, decreased strength, hypomobility, increased muscle spasms, impaired flexibility, impaired UE functional use, postural dysfunction, and pain.   ACTIVITY LIMITATIONS lifting, sitting, and reach over head  PARTICIPATION LIMITATIONS: cleaning, laundry, and driving  PERSONAL FACTORS 3+ comorbidities: cervical DDD, CVA, bil LE neuropathy,   are also affecting patient's functional outcome.   REHAB POTENTIAL: Good  CLINICAL DECISION MAKING: Evolving/moderate complexity  EVALUATION COMPLEXITY: Moderate   GOALS: Goals reviewed with patient? Yes  SHORT TERM GOALS: Target date: 11/27/2021   Be independent in initial HEP Baseline:  Goal status: INITIAL  2.  Verbalize postural modifications to improve alignment and reduce UE symptoms  Baseline:  Goal status: INITIAL  3.  Demonstrate Rt cervical A/ROM rotation to > or = to 70 degrees to improve safety with driving Baseline: 60 Goal status: INITIAL  4.  Report > or = to 30% reduction in neck pain and UE radiculopathy with ADLs and  self-care Baseline: 3-7/10 Goal status: INITIAL   LONG TERM GOALS: Target date: 12/25/2021  Be independent in advanced HEP Baseline:  Goal status: INITIAL  2.  Improve FOTO to > or = to 52  Baseline: 36 Goal status: INITIAL  3.  Improve bil grip to > or = to 45# to improve functional use and endurance with ADLs and self-care Baseline: 32/29 Goal status: INITIAL  4.  Report > or = to 70% reduction in neck pain and UE pain with ADLs and self-care  Baseline: 3-7/10 Goal status: INITIAL  5.  Demonstrate > or = to 80 degrees A/ROM rotation bilaterally to improve safety with driving  Baseline: 97/98 Goal status: INITIAL    PLAN: PT FREQUENCY: 1-2x/week  PT DURATION: 8 weeks  PLANNED INTERVENTIONS: Therapeutic exercises, Therapeutic activity, Neuromuscular re-education, Balance training, Gait training, Patient/Family education, Self Care, Joint mobilization, Joint manipulation, Aquatic Therapy, Dry Needling, Electrical stimulation, Spinal manipulation, Spinal mobilization, Cryotherapy, Moist heat, Taping, Traction, Ultrasound, Manual therapy, and Re-evaluation  PLAN FOR NEXT SESSION: review HEP, DN if pt agrees, cervical traction  Sigurd Sos, PT 11/11/21 12:35 PM   [facility] [address], [place], [place] [address] Phone # [phone] Fax [phone]

## 2021-11-13 ENCOUNTER — Other Ambulatory Visit (HOSPITAL_BASED_OUTPATIENT_CLINIC_OR_DEPARTMENT_OTHER): Payer: Self-pay

## 2021-11-13 ENCOUNTER — Encounter (HOSPITAL_BASED_OUTPATIENT_CLINIC_OR_DEPARTMENT_OTHER): Payer: Self-pay | Admitting: Pharmacist

## 2021-11-13 MED ORDER — XIFAXAN 550 MG PO TABS
550.0000 mg | ORAL_TABLET | Freq: Three times a day (TID) | ORAL | 0 refills | Status: DC
  Filled 2021-11-13: qty 42, 14d supply, fill #0

## 2021-11-13 MED FILL — Potassium Chloride Tab ER 10 mEq: ORAL | 90 days supply | Qty: 180 | Fill #0 | Status: AC

## 2021-11-14 ENCOUNTER — Ambulatory Visit: Payer: 59

## 2021-11-14 ENCOUNTER — Other Ambulatory Visit (HOSPITAL_BASED_OUTPATIENT_CLINIC_OR_DEPARTMENT_OTHER): Payer: Self-pay

## 2021-11-14 DIAGNOSIS — M542 Cervicalgia: Secondary | ICD-10-CM | POA: Diagnosis not present

## 2021-11-14 DIAGNOSIS — R252 Cramp and spasm: Secondary | ICD-10-CM

## 2021-11-14 DIAGNOSIS — R293 Abnormal posture: Secondary | ICD-10-CM

## 2021-11-14 NOTE — Therapy (Signed)
OUTPATIENT PHYSICAL THERAPY TREATMENT NOTE   Patient Name: Jesse Bowers MRN: [id number] DOB:1965/11/25, 56 y.o., adult Today's Date: 11/14/2021  PCP: NA-pt reports that he does not currently have a PCP REFERRING PROVIDER: Alger Simons, MD  END OF SESSION:   PT End of Session - 11/14/21 1315     Visit Number 3    Date for PT Re-Evaluation 12/25/21    Authorization Type UMR    PT Start Time 1234    PT Stop Time 1315    PT Time Calculation (min) 41 min    Activity Tolerance Patient tolerated treatment well    Behavior During Therapy WFL for tasks assessed/performed              Past Medical History:  Diagnosis Date   Abnormal weight loss    Anxiety    Arthritis    Cataract    OU   Celiac disease    Cervical neck pain with evidence of disc disease    patient has a cyst    Chronic constipation    Chronic diastolic heart failure ([place])    Pt. 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   Family history of adverse reaction to anesthesia    family has problems with anesthesia of nausea and vomiting    GERD (gastroesophageal reflux disease)    History of in 20's   Gluten enteropathy    H/O parotitis    right    Hard of hearing    History of kidney stones    History of retinal tear    Bilateral   History of staph infection    required wound vac   Hx-TIA (transient ischemic attack)    2015   Kidney stones 08/2020   LVH (left ventricular hypertrophy) 12/15/2016   Mild, noted on ECHO   MVP (mitral valve prolapse)    NAFL (nonalcoholic fatty liver)    Neck pain    Neuromuscular disorder ([place])    bilateral neuropathy feet.   Nuclear sclerotic cataract of both eyes 09/08/2019   Pneumonia 12/17/2010   Polycythemia    Polycythemia, secondary     PONV (postoperative nausea and vomiting)    Protein C deficiency (HCC)    Dr. Anne Fu   Psoriasis    16 X10 cm psoriatic rash on sole of left foot ; open and occ scant bleeding;    psoriatic arthritis    PTSD (post-traumatic stress disorder)    Scaphoid fracture of wrist 09/23/2013   Seizure ([place])    childhood, medication until age 36 then weanned completely off   Sleep apnea    split night study last done by Dr. [name] 06/18/15 shows severe OSA, CSA, and hypersomnia, rec bipap   Splenomegaly    Stenosis of ureteropelvic junction (UPJ)    left   Stroke [facility])    CVA vs TIA in left cerebrum causing slight right sided weakness-Dr. [name] follows   Syrinx of spinal cord ([place]) 01/06/2014   c spine on MRI   Tachycardia    hx of    Transfusion history    past history- none recent, after surgeries due to blood loss   Transgender with history of gender affirmation surgery  Wears glasses    Wears hearing aid    Past Surgical History:  Procedure Laterality Date   ABDOMINAL HYSTERECTOMY Bilateral 1994   TAH, BSO- tranverse incision at 56 yo   ANKLE ARTHROSCOPY WITH RECONSTRUCTION Right 2007   CHOLECYSTECTOMY     laparoscopic   COLONOSCOPY     x3   EYE SURGERY     Left eye 03/02/2018, right 02/15/2018   HIP ARTHROSCOPY W/ LABRAL REPAIR Right 05/11/2013   acetabular labral tear 03/30/2013   KNEE ARTHROPLASTY Right    KNEE JOINT MANIPULATION Left    x3 under anesthesia   KNEE SURGERY Bilateral 1984   Right ACL, left PCL repair   LITHOTRIPSY  2005   LIVER BIOPSY  2013   normal results.   MASTECTOMY Bilateral    prior to 2009   MOUTH SURGERY     NASAL SEPTUM SURGERY N/A 09/20/2015   by ENT Dr. [name]   OVARIAN CYST SURGERY Left    size of grapefruit, was informed that she had shortened vagina   SHOULDER SURGERY Bilateral    Right 08/15/2016, Left 11/15/2016   THUMB ARTHROSCOPY Left    THYROIDECTOMY, PARTIAL Left 2008   TOTAL KNEE ARTHROPLASTY Right 08/23/2018   Procedure:  TOTAL KNEE ARTHROPLASTY;  Surgeon: [name], MD;  Location: [facility];  Service: Orthopedics;  Laterality: Right;  60mn   TOTAL KNEE REVISION Left 02/06/2016   Procedure: LEFT TOTAL KNEE REVISION;  Surgeon: FGaynelle Arabian MD;  Location: [facility];  Service: Orthopedics;  Laterality: Left;   TOTAL KNEE REVISION Left 04/22/2017   Procedure: Left knee polyethylene revision;  Surgeon: AGaynelle Arabian MD;  Location: [facility];  Service: Orthopedics;  Laterality: Left;   UPPER GI ENDOSCOPY  2003   Patient Active Problem List   Diagnosis Date Noted   Spondylosis of lumbar spine 10/03/2020   Pseudophakia of both eyes 05/07/2020   Retinal telangiectasis of both eyes 05/07/2020   Lattice degeneration of peripheral retina, left 09/08/2019   Lattice degeneration, right eye 09/08/2019   Chronic diastolic heart failure ([place] 05/24/2019   Urinary dysfunction 04/12/2019   Osteoarthritis of right knee 08/23/2018   Constipation due to opioid therapy 03/30/2018   SNHL (sensorineural hearing loss) 12/04/2017   Abnormal urinary stream 12/03/2017   Osteoarthritis of carpometacarpal (CMC) joint of thumb 11/30/2017   Muscle weakness 11/17/2017   Transient vision disturbance 11/12/2017   Bilateral hand pain 10/30/2017   Pain of left hip joint 10/09/2017   Gynecomastia 07/10/2017   Iron deficiency anemia 07/05/2017   Spasticity 05/20/2017   Lumbar radiculitis 04/20/2017   Ulnar 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, left 11/28/2016   Idiopathic peripheral neuropathy 11/28/2016   Failed total knee arthroplasty, 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   Long term (current) use of anticoagulants 08/23/2015   Right upper quadrant abdominal pain 08/23/2015   Memory loss 05/10/2015   Gait abnormality 04/07/2015   Alkaline phosphatase elevation 04/07/2015   History of thrombosis 03/26/2015   Medial epicondylitis 02/07/2015   Cognitive decline 12/21/2014   Leukopenia 12/05/2014   Rotator cuff syndrome of right shoulder 10/27/2014   Status post left  knee replacement 08/22/2014   Left lateral epicondylitis 08/22/2014   Chronic cerebral ischemia 08/18/2014   Arthrofibrosis of knee joint 08/17/2014   Cubital canal compression syndrome, left 08/17/2014   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[place] 04/10/2014   Chronic pain syndrome 04/10/2014   Insomnia 04/10/2014   Chronic non-specific white matter lesions on MRI 04/10/2014   CFS (chronic fatigue syndrome) 04/10/2014   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  [place]) 03/01/2014   Biceps tendonitis on left 03/01/2014   Polycythemia vera ([place]) 12/27/2013   H/O TIA (transient ischemic attack) and stroke 12/27/2013   Neck pain 12/27/2013   OSA (obstructive sleep apnea) 12/08/2013   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/31/2013   Depression with anxiety 08/04/2013   Protein C deficiency ([place]) 08/01/2013   Post traumatic stress disorder (PTSD) 08/01/2013   Speech abnormality 07/25/2013   Obesity 05/10/2013   Lower extremity edema 05/10/2013   GERD (gastroesophageal reflux disease) 05/10/2013   Arthritis 05/10/2013   OA (osteoarthritis) of knee 03/15/2013   Headache 10/25/2012   Palpitations 10/18/2012   Fatty liver determined by biopsy 06/01/2012   Arthropathic psoriasis, unspecified ([place]) 04/29/2012   Abnormal liver enzymes 03/29/2012   Psoriatic arthritis ([place]) 12/29/2011   Left Renal Hydronephrosis 12/11/2010   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     THERAPY DIAG:  Cervicalgia  Abnormal posture  Cramp and spasm  Rationale for Evaluation and Treatment Rehabilitation  PERTINENT HISTORY: Lumbar stenosis L5-S1, failed TKA 2019, neuropathy in bil feet, PTSD, CVA vs TIA Rt weakness, head trauma 2019, Rt thumb replacement, bil total knee replacements,  bil shoulder scope surgeries  PRECAUTIONS: Fall  SUBJECTIVE: Burning pain in the face is still there.  The exercises are going well.    PAIN:  Are you having pain?  Pt reports a warm, kind of like heart burn feeling, that goes from my forehead and down through my GI tract.  Neck pain: 5/10   OBJECTIVE: (objective measures completed at initial evaluation unless otherwise dated)  DIAGNOSTIC FINDINGS:  MRI on 10/24/21: No interval change of the syrinx (fluid filled cyst) spanning the C4 through C6 levels measuring up to 4 mm in diameter. 2. Mild degenerative changes of the cervical spine without high-grade spinal canal stenosis at any level. 3. Mild to moderate left neural foraminal narrowing at C3-4, unchanged.   PATIENT SURVEYS:  FOTO 36 (52 is goal)   COGNITION: Overall cognitive status: Within functional limits for tasks assessed SENSATION: WFL   POSTURE: rounded shoulders and forward head   PALPATION: Trigger points in bil suboccipitals, upper traps and tension in bil cervical paraspinals.  Pt with reduced segmental mobility C3-7 and T1-5.  Pain reduction with manual cervical traction in supine.              CERVICAL ROM:    Active ROM A/PROM (deg) eval A/ROM 11/11/21  Flexion 50   Extension 30   Right lateral flexion 35   Left lateral flexion 40   Right rotation 60 65  Left rotation 70 80   (Blank rows = not tested)   UPPER EXTREMITY ROM:   Bil UE A/ROM limited by 25% in all directions.    UPPER EXTREMITY MMT:   MMT Right eval Left eval  Shoulder flexion 4-/5 4/5  Shoulder extension 4- 4  Shoulder abduction 4- 4-  Shoulder adduction      Shoulder extension      Shoulder internal rotation 4+ 4+  Shoulder external rotation 4- 4-  Middle trapezius      Lower trapezius      Elbow flexion      Elbow extension      Wrist flexion      Wrist extension  Wrist ulnar deviation      Wrist radial deviation      Wrist pronation      Wrist supination       Grip strength 32 29   (Blank rows = not tested)   TODAY'S TREATMENT:  11/14/21:  Seated:yellow band ER 10x, horizontal abd 10x, scap squeezes 5x. Red band x10 each 3 way shoulder raises 10x 0# Trigger Point Dry-Needling  Treatment instructions: Expect mild to moderate muscle soreness. S/S of pneumothorax if dry needled over a lung field, and to seek immediate medical attention should they occur. Patient verbalized understanding of these instructions and education.  Patient Consent Given: Yes Education handout provided: Yes Muscles treated: bil upper traps, bil cervical multifidi and upper thoracic multifidi Treatment response/outcome: Utilized skilled palpation to identify trigger points.  During dry needling able to palpate muscle twitch and muscle elongation   Skilled palpation and monitoring by PT during dry needling    11/11/21: Posture education:  Seated:yellow band ER 10x, horizontal abd 10x, scap squeezes 5x 3 way shoulder raises 10x 0# Posture review/education  Date: 10/30/21 HEP established-see below      PATIENT EDUCATION:  Education details: Access Code: [email or web address] Person educated: Patient and Parent Education method: Explanation, Demonstration, and Handouts Education comprehension: verbalized understanding and returned demonstration     HOME EXERCISE PROGRAM: Access Code: [email or web address] URL: [email or web address] Date: 10/30/2021 Prepared by: Claiborne Billings   Exercises - Seated Cervical Flexion AROM  - 3 x daily - 7 x weekly - 1 sets - 3 reps - 20 hold - Seated Cervical Sidebending AROM  - 3 x daily - 7 x weekly - 1 sets - 3 reps - 20 hold - Seated Cervical Rotation AROM  - 3 x daily - 7 x weekly - 1 sets - 3 reps - 20 hold - Seated Correct Posture  - 1 x daily - 7 x weekly - 3 sets - 10 reps - Seated Scapular Retraction  - 5 x daily - 7 x weekly - 1 sets - 10 reps - 5 hold   ASSESSMENT:   CLINICAL IMPRESSION: Pt demonstrates improved cervical A/ROM this  week.  PT modified bands to have loops today due to limited grip strength.  PT provided verbal cues for alignment and scapular depression.  Pt is doing well with theraband and 3 way raises today. Pt continues to work on posture at home and requires frequent cueing with exercise today.   Pt with tension and trigger points in bil upper traps and cervical muscles and had good response to needling with twitch response. Patient will benefit from skilled PT to address the below impairments and improve overall function.    OBJECTIVE IMPAIRMENTS decreased mobility, decreased ROM, decreased strength, hypomobility, increased muscle spasms, impaired flexibility, impaired UE functional use, postural dysfunction, and pain.    ACTIVITY LIMITATIONS lifting, sitting, and reach over head   PARTICIPATION LIMITATIONS: cleaning, laundry, and driving   PERSONAL FACTORS 3+ comorbidities: cervical DDD, CVA, bil LE neuropathy,   are also affecting patient's functional outcome.    REHAB POTENTIAL: Good   CLINICAL DECISION MAKING: Evolving/moderate complexity   EVALUATION COMPLEXITY: Moderate   GOALS: Goals reviewed with patient? Yes   SHORT TERM GOALS: Target date: 11/27/2021    Be independent in initial HEP Baseline:  Goal status: INITIAL   2.  Verbalize postural modifications to improve alignment and reduce UE symptoms  Baseline:  Goal status: In progress    3.  Demonstrate Rt cervical A/ROM rotation to >  or = to 70 degrees to improve safety with driving Baseline: 65 Rt, 70 Lt (11/14/21) Goal status: In progress    4.  Report > or = to 30% reduction in neck pain and UE radiculopathy with ADLs and self-care Baseline: 3-7/10 Goal status: INITIAL     LONG TERM GOALS: Target date: 12/25/2021   Be independent in advanced HEP Baseline:  Goal status: INITIAL   2.  Improve FOTO to > or = to 52  Baseline: 36 Goal status: INITIAL   3.  Improve bil grip to > or = to 45# to improve functional use and  endurance with ADLs and self-care Baseline: 32/29 Goal status: INITIAL   4.  Report > or = to 70% reduction in neck pain and UE pain with ADLs and self-care  Baseline: 3-7/10 Goal status: INITIAL   5.  Demonstrate > or = to 80 degrees A/ROM rotation bilaterally to improve safety with driving  Baseline: 16/10 Goal status: INITIAL       PLAN: PT FREQUENCY: 1-2x/week   PT DURATION: 8 weeks   PLANNED INTERVENTIONS: Therapeutic exercises, Therapeutic activity, Neuromuscular re-education, Balance training, Gait training, Patient/Family education, Self Care, Joint mobilization, Joint manipulation, Aquatic Therapy, Dry Needling, Electrical stimulation, Spinal manipulation, Spinal mobilization, Cryotherapy, Moist heat, Taping, Traction, Ultrasound, Manual therapy, and Re-evaluation   PLAN FOR NEXT SESSION: try arm bike, consider cervical traction, postural strength, assess response to DN    Sigurd Sos, PT 11/14/21 1:16 PM

## 2021-11-14 NOTE — Patient Instructions (Signed)

## 2021-11-15 ENCOUNTER — Other Ambulatory Visit (HOSPITAL_BASED_OUTPATIENT_CLINIC_OR_DEPARTMENT_OTHER): Payer: Self-pay

## 2021-11-18 ENCOUNTER — Encounter: Payer: Self-pay | Admitting: Physical Medicine & Rehabilitation

## 2021-11-18 ENCOUNTER — Other Ambulatory Visit: Payer: Self-pay | Admitting: Physical Medicine & Rehabilitation

## 2021-11-18 ENCOUNTER — Other Ambulatory Visit (HOSPITAL_COMMUNITY): Payer: Self-pay

## 2021-11-18 ENCOUNTER — Encounter: Payer: Self-pay | Admitting: Physical Therapy

## 2021-11-18 ENCOUNTER — Ambulatory Visit (INDEPENDENT_AMBULATORY_CARE_PROVIDER_SITE_OTHER): Payer: 59 | Admitting: Ophthalmology

## 2021-11-18 ENCOUNTER — Encounter (HOSPITAL_BASED_OUTPATIENT_CLINIC_OR_DEPARTMENT_OTHER): Payer: Self-pay | Admitting: Pharmacist

## 2021-11-18 ENCOUNTER — Encounter (INDEPENDENT_AMBULATORY_CARE_PROVIDER_SITE_OTHER): Payer: Self-pay | Admitting: Ophthalmology

## 2021-11-18 ENCOUNTER — Ambulatory Visit: Payer: 59 | Admitting: Physical Therapy

## 2021-11-18 ENCOUNTER — Other Ambulatory Visit (HOSPITAL_BASED_OUTPATIENT_CLINIC_OR_DEPARTMENT_OTHER): Payer: Self-pay

## 2021-11-18 DIAGNOSIS — M542 Cervicalgia: Secondary | ICD-10-CM | POA: Diagnosis not present

## 2021-11-18 DIAGNOSIS — H35412 Lattice degeneration of retina, left eye: Secondary | ICD-10-CM

## 2021-11-18 DIAGNOSIS — M47816 Spondylosis without myelopathy or radiculopathy, lumbar region: Secondary | ICD-10-CM

## 2021-11-18 DIAGNOSIS — H35073 Retinal telangiectasis, bilateral: Secondary | ICD-10-CM | POA: Diagnosis not present

## 2021-11-18 DIAGNOSIS — R252 Cramp and spasm: Secondary | ICD-10-CM

## 2021-11-18 DIAGNOSIS — H35411 Lattice degeneration of retina, right eye: Secondary | ICD-10-CM | POA: Diagnosis not present

## 2021-11-18 DIAGNOSIS — H43811 Vitreous degeneration, right eye: Secondary | ICD-10-CM | POA: Diagnosis not present

## 2021-11-18 DIAGNOSIS — R293 Abnormal posture: Secondary | ICD-10-CM

## 2021-11-18 MED ORDER — XARELTO 20 MG PO TABS
20.0000 mg | ORAL_TABLET | Freq: Every evening | ORAL | 1 refills | Status: DC
  Filled 2021-11-18 – 2021-12-24 (×7): qty 90, 90d supply, fill #0
  Filled 2022-03-24 – 2022-03-25 (×2): qty 90, 90d supply, fill #1

## 2021-11-18 MED ORDER — HYDROXYZINE HCL 25 MG PO TABS
12.5000 mg | ORAL_TABLET | Freq: Three times a day (TID) | ORAL | 1 refills | Status: DC | PRN
  Filled 2021-11-18 – 2021-11-22 (×2): qty 90, 30d supply, fill #0

## 2021-11-18 MED ORDER — MORPHINE SULFATE ER 30 MG PO TBCR
30.0000 mg | EXTENDED_RELEASE_TABLET | Freq: Two times a day (BID) | ORAL | 0 refills | Status: DC
  Filled 2021-11-18 – 2021-11-19 (×2): qty 60, 30d supply, fill #0
  Filled ????-??-?? (×2): fill #0

## 2021-11-18 NOTE — Progress Notes (Signed)
11/18/2021     CHIEF COMPLAINT Patient presents for  Chief Complaint  Patient presents with   Retina Follow Up      HISTORY OF PRESENT ILLNESS: Jesse Bowers is a 56 y.o. adult who presents to the clinic today for:   HPI     Retina Follow Up           Diagnosis: Other   Laterality: both eyes   Severity: moderate   Course: stable         Comments   3 MOS FOR DILATE OU, COLOR FP, OCT. Pt stated vision has remained stable however vision is still blurry. Pt confirms floaters in right eye but denies FOL.        Last edited by Silvestre Moment on 11/18/2021  2:08 PM.      Referring physician: Concepcion Elk, MD [address] [place],  [place] [address]  HISTORICAL INFORMATION:   Selected notes from the MEDICAL RECORD NUMBER       CURRENT MEDICATIONS: Current Outpatient Medications (Ophthalmic Drugs)  Medication Sig   cycloSPORINE (RESTASIS) 0.05 % ophthalmic emulsion Instill 1 drop into both eyes twice a day   RESTASIS 0.05 % ophthalmic emulsion 1 drop 2 (two) times daily.   No current facility-administered medications for this visit. (Ophthalmic Drugs)   Current Outpatient Medications (Other)  Medication Sig   acyclovir ointment (ZOVIRAX) 5 % as needed.   baclofen (LIORESAL) 20 MG tablet Take 1-2 tablets (20-40 mg total) by mouth 4 (four) times daily. 1 tablet with breakfast, lunch, and dinner and 2 tablets at bedtime   Calcipotriene-Betameth Diprop 0.005-0.064 % FOAM Apply 1 application. topically 2 (two) times daily as needed.   Calcium Carbonate Antacid (TUMS ULTRA 1000 PO) Take by mouth daily.   ciprofloxacin-dexamethasone (CIPRODEX) OTIC suspension    Clobetasol Propionate Emulsion 0.05 % topical foam daily.   Clobetasol Propionate Emulsion 0.05 % topical foam Apply a thin layer to the affected area(s) on the scalp   clonazePAM (KLONOPIN) 0.5 MG tablet Take 1 tab twice during the day and 2 tabs at night (to replace clorazepate)   clorazepate (TRANXENE) 15 MG  tablet Take 15 mg by mouth at bedtime.   clorazepate (TRANXENE) 7.5 MG tablet Take 7.5 mg by mouth 2 (two) times daily.   desonide (DESOWEN) 0.05 % cream Apply topically 2 (two) times daily.   desonide (DESOWEN) 0.05 % cream 1(one) application(s) topical 2(two) times a day   desvenlafaxine (PRISTIQ) 100 MG 24 hr tablet Take 100 mg by mouth daily.   desvenlafaxine (PRISTIQ) 100 MG 24 hr tablet Take 1 tab daily,   ergocalciferol (VITAMIN D2) 1.25 MG (50000 UT) capsule Take 1 capsule by mouth once a week.   FIBER SELECT GUMMIES PO Take by mouth daily.   folic acid (KP FOLIC ACID) 1 MG tablet Take 1 tablet po every day   furosemide (LASIX) 20 MG tablet Take 1 tablet (20 mg total) by mouth daily as needed for edema.   furosemide (LASIX) 80 MG tablet Take 1 tablet (80 mg total) by mouth daily.   halobetasol (ULTRAVATE) 0.05 % cream Apply topically.   halobetasol (ULTRAVATE) 0.05 % cream Apply to affected area twice daily to hands, feet and knees.   hydrOXYzine (ATARAX) 25 MG tablet Take 1/2 to 1 tab tid prn anxiety   hydrOXYzine (ATARAX) 25 MG tablet Take 0.5-1 tablets (12.5-25 mg total) by mouth 3 (three) times daily as needed for anxiety.   linaclotide (LINZESS) 290 MCG  CAPS capsule Take 1 capsule by mouth daily before breakfast.   linaclotide (LINZESS) 290 MCG CAPS capsule Take 1 capsule by mouth once daily.   methotrexate (RHEUMATREX) 2.5 MG tablet Take 15 mg by mouth once a week. Caution:Chemotherapy. Protect from light.   methotrexate (RHEUMATREX) 2.5 MG tablet Take 6 tablets po once weekly   metoprolol tartrate (LOPRESSOR) 25 MG tablet Take 1 tablet (25 mg total) by mouth 2 (two) times daily as needed.   morphine (MS CONTIN) 30 MG 12 hr tablet Take 1 tablet (30 mg total) by mouth every 12 (twelve) hours.   Multiple Vitamins-Minerals (ALIVE MULTI-VITAMIN) CHEW    Oxycodone HCl 10 MG TABS Take 1 tablet (10 mg total) by mouth every 8 (eight) hours as needed (pain).   polyethylene glycol powder  (GLYCOLAX/MIRALAX) 17 GM/SCOOP powder Purelax 17 gram/dose oral powder  TAKE 1 CAPFUL MIXED INTO 8 OZ OF CLEAR LIQUID UP TO FOUR TIMES A DAY AS NEEDED   potassium chloride (KLOR-CON) 10 MEQ tablet Take by mouth.   potassium chloride (KLOR-CON) 10 MEQ tablet Take 2 tablets by mouth every day.   potassium chloride (KLOR-CON) 10 MEQ tablet Take 2 tablets by mouth every day.   predniSONE (DELTASONE) 20 MG tablet Take 2 tablets (40 mg total) by mouth daily with breakfast.   rifaximin (XIFAXAN) 550 MG TABS tablet Take 1 tablet (550 mg total) by mouth 3 times daily for 14 days.   rivaroxaban (XARELTO) 20 MG TABS tablet Take 1 tablet (20 mg total) by mouth Nightly.   Secukinumab (COSENTYX SENSOREADY PEN) 150 MG/ML SOAJ Inject 2 pens subcutaneously every 4 weeks   Tenapanor HCl (IBSRELA) 50 MG TABS Take 1 tablet (50 mg total) by mouth 2 times daily.   testosterone cypionate (DEPOTESTOSTERONE CYPIONATE) 200 MG/ML injection INJECT 0.5 MLS (100 MG TOTAL) INTO THE MUSCLE ONCE A WEEK.   ursodiol (ACTIGALL) 250 MG tablet Take 1 tablet by mouth at lunch and dinner in addition to ursodiol 500 mg for a total 750 mg dose.   ursodiol (ACTIGALL) 500 MG tablet Take 1 tablet (500 mg) by mouth at lunch and dinner in addition to ursodiol 250 mg for a total 750 mg dose.   Vitamin D, Ergocalciferol, (DRISDOL) 1.25 MG (50000 UNIT) CAPS capsule Take 1 capsule by mouth once weekly.   XARELTO 20 MG TABS tablet TAKE 1 TABLET(20 MG) BY MOUTH EVERY NIGHT AT BEDTIME   No current facility-administered medications for this visit. (Other)      REVIEW OF SYSTEMS: ROS   Negative for: Constitutional, Gastrointestinal, Neurological, Skin, Genitourinary, Musculoskeletal, HENT, Endocrine, Cardiovascular, Eyes, Respiratory, Psychiatric, Allergic/Imm, Heme/Lymph Last edited by Hurman Horn, MD on 11/18/2021  2:29 PM.       ALLERGIES Allergies  Allergen Reactions   Gabapentin Nausea Only and Other (See Comments)    Other  reaction(s): nausea, mental status Drowsiness and restlessness. Pt. States, " It makes me crazy, I can't take this medicine." Other reaction(s): Other (See Comments) Tardive dyskinesia   Penicillin G Anaphylaxis and Other (See Comments)    Has patient had a PCN reaction causing immediate rash, facial/tongue/throat swelling, SOB or lightheadedness with hypotension: Yes Has patient had a PCN reaction causing severe rash involving mucus membranes or skin necrosis: No Has patient had a PCN reaction that required hospitalization Yes Has patient had a PCN reaction occurring within the last 10 years: No If all of the above answers are "NO", then may proceed with Cephalosporin use.    Sulfa Antibiotics  Rash    Stevens-Johnson rash   Nortriptyline Other (See Comments)    Dry mouth at 25 mg dose.  Tolerates 10 mg dose Other reaction(s): Other (See Comments) Dry Mouth   Pregabalin Other (See Comments)    Ineffective Other reaction(s): Other (See Comments) Tardive dyskinesia   Tegaderm Ag Mesh [Silver] Dermatitis    Causes blistering wounds    Ibuprofen Other (See Comments)    Contraindicated with Xarelto.    Sulfacetamide Sodium-Sulfur Rash    PAST MEDICAL HISTORY Past Medical History:  Diagnosis Date   Abnormal weight loss    Anxiety    Arthritis    Cataract    OU   Celiac disease    Cervical neck pain with evidence of disc disease    patient has a cyst    Chronic constipation    Chronic diastolic heart failure (HCC)    Pt. 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   Family history of adverse reaction to anesthesia    family has problems with anesthesia of nausea and vomiting    GERD (gastroesophageal reflux disease)    History of in 20's   Gluten enteropathy     H/O parotitis    right    Hard of hearing    History of kidney stones    History of retinal tear    Bilateral   History of staph infection    required wound vac   Hx-TIA (transient ischemic attack)    2015   Kidney stones 08/2020   LVH (left ventricular hypertrophy) 12/15/2016   Mild, noted on ECHO   MVP (mitral valve prolapse)    NAFL (nonalcoholic fatty liver)    Neck pain    Neuromuscular disorder ([place])    bilateral neuropathy feet.   Nuclear sclerotic cataract of both eyes 09/08/2019   Pneumonia 12/17/2010   Polycythemia    Polycythemia, secondary    PONV (postoperative nausea and vomiting)    Protein C deficiency (HCC)    Dr. Anne Fu   Psoriasis    16 X10 cm psoriatic rash on sole of left foot ; open and occ scant bleeding;    psoriatic arthritis    PTSD (post-traumatic stress disorder)    Scaphoid fracture of wrist 09/23/2013   Seizure ([place])    childhood, medication until age 4 then weanned completely off   Sleep apnea    split night study last done by Dr. [name] 06/18/15 shows severe OSA, CSA, and hypersomnia, rec bipap   Splenomegaly    Stenosis of ureteropelvic junction (UPJ)    left   Stroke 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 [name] follows   Syrinx of spinal cord ([place]) 01/06/2014   c spine on MRI   Tachycardia    hx of    Transfusion history    past history- none recent, after surgeries due to blood loss   Transgender with history of gender affirmation surgery    Wears glasses    Wears hearing aid    Past Surgical History:  Procedure Laterality Date   ABDOMINAL HYSTERECTOMY Bilateral 1994   TAH, BSO- tranverse incision at 56 yo   ANKLE  ARTHROSCOPY WITH RECONSTRUCTION Right 2007   CHOLECYSTECTOMY     laparoscopic   COLONOSCOPY     x3   EYE SURGERY     Left eye 03/02/2018, right 02/15/2018   HIP ARTHROSCOPY W/ LABRAL REPAIR Right 05/11/2013   acetabular labral tear 03/30/2013   KNEE ARTHROPLASTY  Right    KNEE JOINT MANIPULATION Left    x3 under anesthesia   KNEE SURGERY Bilateral 1984   Right ACL, left PCL repair   LITHOTRIPSY  2005   LIVER BIOPSY  2013   normal results.   MASTECTOMY Bilateral    prior to 2009   MOUTH SURGERY     NASAL SEPTUM SURGERY N/A 09/20/2015   by ENT Dr. [name]   OVARIAN CYST SURGERY Left    size of grapefruit, was informed that she had shortened vagina   SHOULDER SURGERY Bilateral    Right 08/15/2016, Left 11/15/2016   THUMB ARTHROSCOPY Left    THYROIDECTOMY, PARTIAL Left 2008   TOTAL KNEE ARTHROPLASTY Right 08/23/2018   Procedure: TOTAL KNEE ARTHROPLASTY;  Surgeon: [name], MD;  Location: [facility];  Service: Orthopedics;  Laterality: Right;  72mn   TOTAL KNEE REVISION Left 02/06/2016   Procedure: LEFT TOTAL KNEE REVISION;  Surgeon: FGaynelle Arabian MD;  Location: [facility];  Service: Orthopedics;  Laterality: Left;   TOTAL KNEE REVISION Left 04/22/2017   Procedure: Left knee polyethylene revision;  Surgeon: AGaynelle Arabian MD;  Location: [facility];  Service: Orthopedics;  Laterality: Left;   UPPER GI ENDOSCOPY  2003    FAMILY HISTORY Family History  Problem Relation Age of Onset   Stroke Maternal Grandfather        538  Heart attack Maternal Grandfather    Glaucoma Maternal Grandfather    Macular degeneration Maternal Grandfather    Breast cancer Sister    Hypertension Mother    Psoriasis Mother    Other Mother        meningioma developed ~2019   Glaucoma Mother    Cancer Paternal Grandfather    Heart attack Paternal Grandfather    Stroke Paternal Uncle        age 56  Polycythemia Paternal Uncle    Stroke Maternal Grandmother    Congestive Heart Failure Maternal Grandmother    Heart attack Maternal Grandmother    Protein C deficiency Sister 367      Miscarriages   Breast cancer Maternal Aunt 35    SOCIAL HISTORY Social History   Tobacco Use   Smoking status: Never   Smokeless tobacco: Never  Vaping Use   Vaping Use: Never used   Substance Use Topics   Alcohol use: Yes    Comment: social   Drug use: No         OPHTHALMIC EXAM:  Base Eye Exam     Visual Acuity (ETDRS)       Right Left   Dist [place] 20/20 20/25         Tonometry (Tonopen, 2:13 PM)       Right Left   Pressure 14 15         Pupils       Pupils APD   Right PERRL None   Left PERRL None         Visual Fields       Left Right    Full Full         Neuro/Psych     Oriented x3: Yes   Mood/Affect: Normal  Dilation     Both eyes: 1.0% Mydriacyl, 2.5% Phenylephrine @ 2:13 PM           Slit Lamp and Fundus Exam     External Exam       Right Left   External Normal Normal         Slit Lamp Exam       Right Left   Lids/Lashes Normal Normal   Conjunctiva/Sclera White and quiet White and quiet   Cornea Clear Clear   Anterior Chamber Deep and quiet Deep and quiet   Iris Round and reactive Round and reactive   Lens Centered posterior chamber intraocular lens Centered posterior chamber intraocular lens   Anterior Vitreous Normal Normal         Fundus Exam       Right Left   Posterior Vitreous Posterior vitreous detachment Normal   Disc Normal Normal   C/D Ratio 0.2 0.2   Macula Normal Normal   Vessels Normal Normal   Periphery Lattice generation superonasal, inferonasal and inferotemporal each with good laser photocoagulation reaction. Patient inferotemporal no treatment needed at 530, pexy around lattice degeneration superotemporal.            IMAGING AND PROCEDURES  Imaging and Procedures for 11/18/21  OCT, Retina - OU - Both Eyes       Right Eye Quality was good. Scan locations included subfoveal. Central Foveal Thickness: 294. Progression has been stable. Findings include normal foveal contour.   Left Eye Quality was good. Scan locations included subfoveal. Central Foveal Thickness: 289. Progression has been stable. Findings include normal foveal contour.   Notes   Very  minor micro-CME may be seen in the inner perifoveal regions but these are not pathologic, no active CME accumulated no intraretinal fluid.  I encourage patient to continue on the BiPAP/CPAP whichever mask works best          Color Fundus Photography Optos - OU - Both Eyes       Right Eye Progression has improved. Disc findings include normal observations. Macula : normal observations. Vessels : normal observations. Periphery : normal observations.   Left Eye Progression has improved. Disc findings include normal observations. Macula : normal observations. Vessels : normal observations. Periphery : normal observations.   Notes Clear media OU.  Good retinopexy inferotemporal OU and regions of previous lattice degeneration, no holes or tears              ASSESSMENT/PLAN:  Retinal telangiectasis of both eyes Still consistent use of BiPAP.  No signs of accumulated CME OU.  Observe  Posterior vitreous detachment of right eye No new holes or tears  Lattice degeneration, right eye Good retinopexy no new breaks  Lattice degeneration of peripheral retina, left Good retinopexy no new breaks     ICD-10-CM   1. Retinal telangiectasis of both eyes  H35.073 OCT, Retina - OU - Both Eyes    Color Fundus Photography Optos - OU - Both Eyes    2. Posterior vitreous detachment of right eye  H43.811     3. Lattice degeneration, right eye  H35.411     4. Lattice degeneration of peripheral retina, left  H35.412       1.  History of lattice generation OU with retinal breaks.  Well treated.  No new breaks today  2.  MAC-TEL in the past.  With micro CME.  Not present today on OCT or examination.  Patient does continue on BiPAP  3.  Ophthalmic Meds Ordered this visit:  No orders of the defined types were placed in this encounter.      Return in about 1 year (around 11/19/2022) for DILATE OU, COLOR FP, OCT.  There are no Patient Instructions on file for this  visit.   Explained the diagnoses, plan, and follow up with the patient and they expressed understanding.  Patient expressed understanding of the importance of proper follow up care.   Clent Demark Darnita Woodrum M.D. Diseases & Surgery of the Retina and Vitreous [facility] [place] 11/18/21     Abbreviations: M myopia (nearsighted); A astigmatism; H hyperopia (farsighted); P presbyopia; Mrx spectacle prescription;  CTL contact lenses; OD right eye; OS left eye; OU both eyes  XT exotropia; ET esotropia; PEK punctate epithelial keratitis; PEE punctate epithelial erosions; DES dry eye syndrome; MGD meibomian gland dysfunction; ATs artificial tears; PFAT's preservative free artificial tears; [place] nuclear sclerotic cataract; PSC posterior subcapsular cataract; ERM epi-retinal membrane; PVD posterior vitreous detachment; RD retinal detachment; DM diabetes mellitus; DR diabetic retinopathy; NPDR non-proliferative diabetic retinopathy; PDR proliferative diabetic retinopathy; CSME clinically significant macular edema; DME diabetic macular edema; dbh dot blot hemorrhages; CWS cotton wool spot; POAG primary open angle glaucoma; C/D cup-to-disc ratio; HVF humphrey visual field; GVF goldmann visual field; OCT optical coherence tomography; IOP intraocular pressure; BRVO Branch retinal vein occlusion; CRVO central retinal vein occlusion; CRAO central retinal artery occlusion; BRAO branch retinal artery occlusion; RT retinal tear; SB scleral buckle; PPV pars plana vitrectomy; VH Vitreous hemorrhage; PRP panretinal laser photocoagulation; IVK intravitreal kenalog; VMT vitreomacular traction; MH Macular hole;  NVD neovascularization of the disc; NVE neovascularization elsewhere; AREDS age related eye disease study; ARMD age related macular degeneration; POAG primary open angle glaucoma; EBMD epithelial/anterior basement membrane dystrophy; ACIOL anterior chamber intraocular lens; IOL intraocular lens; PCIOL posterior chamber  intraocular lens; Phaco/IOL phacoemulsification with intraocular lens placement; [place] photorefractive keratectomy; LASIK laser assisted in situ keratomileusis; HTN hypertension; DM diabetes mellitus; COPD chronic obstructive pulmonary disease

## 2021-11-18 NOTE — Assessment & Plan Note (Signed)
Still consistent use of BiPAP.  No signs of accumulated CME OU.  Observe

## 2021-11-18 NOTE — Assessment & Plan Note (Signed)
Good retinopexy no new breaks

## 2021-11-18 NOTE — Assessment & Plan Note (Signed)
No new holes or tears

## 2021-11-18 NOTE — Therapy (Signed)
OUTPATIENT PHYSICAL THERAPY TREATMENT NOTE   Patient Name: Anoop Hemmer MRN: [id number] DOB:[birth year], 56 y.o., adult Today's Date: 11/18/2021  PCP: NA-pt reports that he does not currently have a PCP REFERRING PROVIDER: Alger Simons, MD  END OF SESSION:   PT End of Session - 11/18/21 1110     Visit Number 4    Date for PT Re-Evaluation 12/25/21    Authorization Type UMR    PT Start Time 1118    PT Stop Time 1138    PT Time Calculation (min) 20 min    Activity Tolerance Patient tolerated treatment well    Behavior During Therapy WFL for tasks assessed/performed               Past Medical History:  Diagnosis Date   Abnormal weight loss    Anxiety    Arthritis    Cataract    OU   Celiac disease    Cervical neck pain with evidence of disc disease    patient has a cyst    Chronic constipation    Chronic diastolic heart failure ([place])    Pt. 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   Family history of adverse reaction to anesthesia    family has problems with anesthesia of nausea and vomiting    GERD (gastroesophageal reflux disease)    History of in 20's   Gluten enteropathy    H/O parotitis    right    Hard of hearing    History of kidney stones    History of retinal tear    Bilateral   History of staph infection    required wound vac   Hx-TIA (transient ischemic attack)    2015   Kidney stones 08/2020   LVH (left ventricular hypertrophy) 12/15/2016   Mild, noted on ECHO   MVP (mitral valve prolapse)    NAFL (nonalcoholic fatty liver)    Neck pain    Neuromuscular disorder ([place])    bilateral neuropathy feet.   Nuclear sclerotic cataract of both eyes 09/08/2019   Pneumonia 12/17/2010   Polycythemia    Polycythemia, secondary     PONV (postoperative nausea and vomiting)    Protein C deficiency (HCC)    Dr. Anne Fu   Psoriasis    16 X10 cm psoriatic rash on sole of left foot ; open and occ scant bleeding;    psoriatic arthritis    PTSD (post-traumatic stress disorder)    Scaphoid fracture of wrist 09/23/2013   Seizure ([place])    childhood, medication until age 81 then weanned completely off   Sleep apnea    split night study last done by Dr. [name] 06/18/15 shows severe OSA, CSA, and hypersomnia, rec bipap   Splenomegaly    Stenosis of ureteropelvic junction (UPJ)    left   Stroke [facility])    CVA vs TIA in left cerebrum causing slight right sided weakness-Dr. [name] follows   Syrinx of spinal cord ([place]) 01/06/2014   c spine on MRI   Tachycardia    hx of    Transfusion history    past history- none recent, after surgeries due to blood loss   Transgender with history of gender affirmation surgery  Wears glasses    Wears hearing aid    Past Surgical History:  Procedure Laterality Date   ABDOMINAL HYSTERECTOMY Bilateral 1994   TAH, BSO- tranverse incision at 56 yo   ANKLE ARTHROSCOPY WITH RECONSTRUCTION Right 2007   CHOLECYSTECTOMY     laparoscopic   COLONOSCOPY     x3   EYE SURGERY     Left eye 03/02/2018, right 02/15/2018   HIP ARTHROSCOPY W/ LABRAL REPAIR Right 05/11/2013   acetabular labral tear 03/30/2013   KNEE ARTHROPLASTY Right    KNEE JOINT MANIPULATION Left    x3 under anesthesia   KNEE SURGERY Bilateral 1984   Right ACL, left PCL repair   LITHOTRIPSY  2005   LIVER BIOPSY  2013   normal results.   MASTECTOMY Bilateral    prior to 2009   MOUTH SURGERY     NASAL SEPTUM SURGERY N/A 09/20/2015   by ENT Dr. [name]   OVARIAN CYST SURGERY Left    size of grapefruit, was informed that she had shortened vagina   SHOULDER SURGERY Bilateral    Right 08/15/2016, Left 11/15/2016   THUMB ARTHROSCOPY Left    THYROIDECTOMY, PARTIAL Left 2008   TOTAL KNEE ARTHROPLASTY Right 08/23/2018   Procedure:  TOTAL KNEE ARTHROPLASTY;  Surgeon: [name], MD;  Location: [facility];  Service: Orthopedics;  Laterality: Right;  72mn   TOTAL KNEE REVISION Left 02/06/2016   Procedure: LEFT TOTAL KNEE REVISION;  Surgeon: FGaynelle Arabian MD;  Location: [facility];  Service: Orthopedics;  Laterality: Left;   TOTAL KNEE REVISION Left 04/22/2017   Procedure: Left knee polyethylene revision;  Surgeon: AGaynelle Arabian MD;  Location: [facility];  Service: Orthopedics;  Laterality: Left;   UPPER GI ENDOSCOPY  2003   Patient Active Problem List   Diagnosis Date Noted   Spondylosis of lumbar spine 10/03/2020   Pseudophakia of both eyes 05/07/2020   Retinal telangiectasis of both eyes 05/07/2020   Lattice degeneration of peripheral retina, left 09/08/2019   Lattice degeneration, right eye 09/08/2019   Chronic diastolic heart failure ([place] 05/24/2019   Urinary dysfunction 04/12/2019   Osteoarthritis of right knee 08/23/2018   Constipation due to opioid therapy 03/30/2018   SNHL (sensorineural hearing loss) 12/04/2017   Abnormal urinary stream 12/03/2017   Osteoarthritis of carpometacarpal (CMC) joint of thumb 11/30/2017   Muscle weakness 11/17/2017   Transient vision disturbance 11/12/2017   Bilateral hand pain 10/30/2017   Pain of left hip joint 10/09/2017   Gynecomastia 07/10/2017   Iron deficiency anemia 07/05/2017   Spasticity 05/20/2017   Lumbar radiculitis 04/20/2017   Ulnar 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, left 11/28/2016   Idiopathic peripheral neuropathy 11/28/2016   Failed total knee arthroplasty, 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   Long term (current) use of anticoagulants 08/23/2015   Right upper quadrant abdominal pain 08/23/2015   Memory loss 05/10/2015   Gait abnormality 04/07/2015   Alkaline phosphatase elevation 04/07/2015   History of thrombosis 03/26/2015   Medial epicondylitis 02/07/2015   Cognitive decline 12/21/2014   Leukopenia 12/05/2014   Rotator cuff syndrome of right shoulder 10/27/2014   Status post left  knee replacement 08/22/2014   Left lateral epicondylitis 08/22/2014   Chronic cerebral ischemia 08/18/2014   Arthrofibrosis of knee joint 08/17/2014   Cubital canal compression syndrome, left 08/17/2014   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   Chronic pain syndrome 04/10/2014   Insomnia 04/10/2014   Chronic non-specific white matter lesions on MRI 04/10/2014   CFS (chronic fatigue syndrome) 04/10/2014   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  [place]) 03/01/2014   Biceps tendonitis on left 03/01/2014   Polycythemia vera ([place]) 12/27/2013   H/O TIA (transient ischemic attack) and stroke 12/27/2013   Neck pain 12/27/2013   OSA (obstructive sleep apnea) 12/08/2013   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/31/2013   Depression with anxiety 08/04/2013   Protein C deficiency ([place]) 08/01/2013   Post traumatic stress disorder (PTSD) 08/01/2013   Speech abnormality 07/25/2013   Obesity 05/10/2013   Lower extremity edema 05/10/2013   GERD (gastroesophageal reflux disease) 05/10/2013   Arthritis 05/10/2013   OA (osteoarthritis) of knee 03/15/2013   Headache 10/25/2012   Palpitations 10/18/2012   Fatty liver determined by biopsy 06/01/2012   Arthropathic psoriasis, unspecified ([place]) 04/29/2012   Abnormal liver enzymes 03/29/2012   Psoriatic arthritis ([place]) 12/29/2011   Left Renal Hydronephrosis 12/11/2010   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     THERAPY DIAG:  Cervicalgia  Abnormal posture  Cramp and spasm  Rationale for Evaluation and Treatment Rehabilitation  PERTINENT HISTORY: Lumbar stenosis L5-S1, failed TKA 2019, neuropathy in bil feet, PTSD, CVA vs TIA Rt weakness, head trauma 2019, Rt thumb replacement, bil total knee replacements,  bil shoulder scope surgeries  PRECAUTIONS: Fall  SUBJECTIVE: Needling went well. Knees more painful than neck today but neck is talking to me.  PAIN:  Are you having pain? Neck 6/10, Knees 7/10. Neck pain: 5/10   OBJECTIVE: (objective measures completed at initial evaluation unless otherwise dated)  DIAGNOSTIC FINDINGS:  MRI on 10/24/21: No interval change of the syrinx (fluid filled cyst) spanning the C4 through C6 levels measuring up to 4 mm in diameter. 2. Mild degenerative changes of the cervical spine without high-grade spinal canal stenosis at any level. 3. Mild to moderate left neural foraminal narrowing at C3-4, unchanged.   PATIENT SURVEYS:  FOTO 37 (52 is goal)   COGNITION: Overall cognitive status: Within functional limits for tasks assessed SENSATION: WFL   POSTURE: rounded shoulders and forward head   PALPATION: Trigger points in bil suboccipitals, upper traps and tension in bil cervical paraspinals.  Pt with reduced segmental mobility C3-7 and T1-5.  Pain reduction with manual cervical traction in supine.              CERVICAL ROM:    Active ROM A/PROM (deg) eval A/ROM 11/11/21  Flexion 50   Extension 30   Right lateral flexion 35   Left lateral flexion 40   Right rotation 60 65  Left rotation 70 80   (Blank rows = not tested)   UPPER EXTREMITY ROM:   Bil UE A/ROM limited by 25% in all directions.    UPPER EXTREMITY MMT:   MMT Right eval Left eval  Shoulder flexion 4-/5 4/5  Shoulder extension 4- 4  Shoulder abduction 4- 4-  Shoulder adduction      Shoulder extension      Shoulder internal rotation 4+ 4+  Shoulder external rotation 4- 4-  Middle trapezius      Lower trapezius      Elbow flexion      Elbow extension      Wrist flexion      Wrist extension      Wrist ulnar deviation      Wrist radial deviation      Wrist  pronation      Wrist supination      Grip strength 32 29   (Blank rows = not tested)   TODAY'S TREATMENT:    11/18/21: Arm bike 3 min forward PTA present to monitor. 1# way raise: 10x each min VC for posture Red band shoulder ER 20x Horizontal abd 15x2 Seated scap depressors 5x, then supine arm glides/scap depressors 6x   11/14/21:  Seated:yellow band ER 10x, horizontal abd 10x, scap squeezes 5x. Red band x10 each 3 way shoulder raises 10x 0# Trigger Point Dry-Needling  Treatment instructions: Expect mild to moderate muscle soreness. S/S of pneumothorax if dry needled over a lung field, and to seek immediate medical attention should they occur. Patient verbalized understanding of these instructions and education.  Patient Consent Given: Yes Education handout provided: Yes Muscles treated: bil upper traps, bil cervical multifidi and upper thoracic multifidi Treatment response/outcome: Utilized skilled palpation to identify trigger points.  During dry needling able to palpate muscle twitch and muscle elongation   Skilled palpation and monitoring by PT during dry needling    11/11/21: Posture education:  Seated:yellow band ER 10x, horizontal abd 10x, scap squeezes 5x 3 way shoulder raises 10x 0# Posture review/education  Date: 10/30/21 HEP established-see below      PATIENT EDUCATION:  Education details: Access Code: [email or web address] Person educated: Patient and Parent Education method: Explanation, Demonstration, and Handouts Education comprehension: verbalized understanding and returned demonstration     HOME EXERCISE PROGRAM: Access Code: [email or web address] URL: [email or web address] Date: 10/30/2021 Prepared by: Claiborne Billings   Exercises - Seated Cervical Flexion AROM  - 3 x daily - 7 x weekly - 1 sets - 3 reps - 20 hold - Seated Cervical Sidebending AROM  - 3 x daily - 7 x weekly - 1 sets - 3 reps - 20 hold - Seated Cervical Rotation AROM  - 3 x daily - 7 x weekly - 1 sets - 3 reps - 20 hold - Seated Correct Posture  - 1 x daily - 7 x weekly - 3 sets - 10 reps - Seated Scapular  Retraction  - 5 x daily - 7 x weekly - 1 sets - 10 reps - 5 hold   ASSESSMENT:   CLINICAL IMPRESSION: Pt ambulates into the clinic today, no power chair. Pt reports he is working on his postural awareness, still a work in progress. Pt was able to increase band color to green with no issues and add light resistance to his deltoid strength. Pt continues to work on improving the neuromuscular component of scapula depression. STG goal  &2 met this week.      OBJECTIVE IMPAIRMENTS decreased mobility, decreased ROM, decreased strength, hypomobility, increased muscle spasms, impaired flexibility, impaired UE functional use, postural dysfunction, and pain.    ACTIVITY LIMITATIONS lifting, sitting, and reach over head   PARTICIPATION LIMITATIONS: cleaning, laundry, and driving   PERSONAL FACTORS 3+ comorbidities: cervical DDD, CVA, bil LE neuropathy,   are also affecting patient's functional outcome.    REHAB POTENTIAL: Good   CLINICAL DECISION MAKING: Evolving/moderate complexity   EVALUATION COMPLEXITY: Moderate   GOALS: Goals reviewed with patient? Yes   SHORT TERM GOALS: Target date: 11/27/2021    Be independent in initial HEP Baseline:  Goal status: Goal met 11/18/21   2.  Verbalize postural modifications to improve alignment and reduce UE symptoms  Baseline:  Goal status: Goal met 11/18/21   3.  Demonstrate Rt cervical A/ROM rotation to > or = to 70 degrees to  improve safety with driving Baseline: 65 Rt, 70 Lt (11/14/21) Goal status: In progress    4.  Report > or = to 30% reduction in neck pain and UE radiculopathy with ADLs and self-care Baseline: 3-7/10 Goal status: INITIAL     LONG TERM GOALS: Target date: 12/25/2021   Be independent in advanced HEP Baseline:  Goal status: INITIAL   2.  Improve FOTO to > or = to 52  Baseline: 36 Goal status: INITIAL   3.  Improve bil grip to > or = to 45# to improve functional use and endurance with ADLs and self-care Baseline:  32/29 Goal status: INITIAL   4.  Report > or = to 70% reduction in neck pain and UE pain with ADLs and self-care  Baseline: 3-7/10 Goal status: INITIAL   5.  Demonstrate > or = to 80 degrees A/ROM rotation bilaterally to improve safety with driving  Baseline: 88/87 Goal status: INITIAL       PLAN: PT FREQUENCY: 1-2x/week   PT DURATION: 8 weeks   PLANNED INTERVENTIONS: Therapeutic exercises, Therapeutic activity, Neuromuscular re-education, Balance training, Gait training, Patient/Family education, Self Care, Joint mobilization, Joint manipulation, Aquatic Therapy, Dry Needling, Electrical stimulation, Spinal manipulation, Spinal mobilization, Cryotherapy, Moist heat, Taping, Traction, Ultrasound, Manual therapy, and Re-evaluation   PLAN FOR NEXT SESSION: arm bike, consider cervical traction, progress postural strength, DN    Myrene Galas, PTA 11/18/21 11:46 AM

## 2021-11-19 ENCOUNTER — Other Ambulatory Visit (HOSPITAL_BASED_OUTPATIENT_CLINIC_OR_DEPARTMENT_OTHER): Payer: Self-pay

## 2021-11-21 ENCOUNTER — Other Ambulatory Visit (HOSPITAL_BASED_OUTPATIENT_CLINIC_OR_DEPARTMENT_OTHER): Payer: Self-pay

## 2021-11-21 ENCOUNTER — Encounter (HOSPITAL_BASED_OUTPATIENT_CLINIC_OR_DEPARTMENT_OTHER): Payer: Self-pay | Admitting: Pharmacist

## 2021-11-21 ENCOUNTER — Ambulatory Visit: Payer: 59

## 2021-11-21 DIAGNOSIS — M542 Cervicalgia: Secondary | ICD-10-CM

## 2021-11-21 DIAGNOSIS — R252 Cramp and spasm: Secondary | ICD-10-CM

## 2021-11-21 DIAGNOSIS — R293 Abnormal posture: Secondary | ICD-10-CM

## 2021-11-21 NOTE — Therapy (Signed)
OUTPATIENT PHYSICAL THERAPY TREATMENT NOTE   Patient Name: Jesse Bowers MRN: [id number] DOB:02-17-1966, 56 y.o., adult Today's Date: 11/21/2021  PCP: NA-pt reports that he does not currently have a PCP REFERRING PROVIDER: Alger Simons, MD  END OF SESSION:   PT End of Session - 11/21/21 1258     Visit Number 5    Date for PT Re-Evaluation 12/25/21    Authorization Type UMR    PT Start Time 1233    PT Stop Time 1305    PT Time Calculation (min) 32 min    Activity Tolerance Patient tolerated treatment well    Behavior During Therapy WFL for tasks assessed/performed                Past Medical History:  Diagnosis Date   Abnormal weight loss    Anxiety    Arthritis    Cataract    OU   Celiac disease    Cervical neck pain with evidence of disc disease    patient has a cyst    Chronic constipation    Chronic diastolic heart failure ([place])    Pt. 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   Family history of adverse reaction to anesthesia    family has problems with anesthesia of nausea and vomiting    GERD (gastroesophageal reflux disease)    History of in 20's   Gluten enteropathy    H/O parotitis    right    Hard of hearing    History of kidney stones    History of retinal tear    Bilateral   History of staph infection    required wound vac   Hx-TIA (transient ischemic attack)    2015   Kidney stones 08/2020   LVH (left ventricular hypertrophy) 12/15/2016   Mild, noted on ECHO   MVP (mitral valve prolapse)    NAFL (nonalcoholic fatty liver)    Neck pain    Neuromuscular disorder ([place])    bilateral neuropathy feet.   Nuclear sclerotic cataract of both eyes 09/08/2019   Pneumonia 12/17/2010   Polycythemia    Polycythemia, secondary     PONV (postoperative nausea and vomiting)    Protein C deficiency (HCC)    Dr. Anne Fu   Psoriasis    16 X10 cm psoriatic rash on sole of left foot ; open and occ scant bleeding;    psoriatic arthritis    PTSD (post-traumatic stress disorder)    Scaphoid fracture of wrist 09/23/2013   Seizure ([place])    childhood, medication until age 84 then weanned completely off   Sleep apnea    split night study last done by Dr. [name] 06/18/15 shows severe OSA, CSA, and hypersomnia, rec bipap   Splenomegaly    Stenosis of ureteropelvic junction (UPJ)    left   Stroke [facility])    CVA vs TIA in left cerebrum causing slight right sided weakness-Dr. [name] follows   Syrinx of spinal cord ([place]) 01/06/2014   c spine on MRI   Tachycardia    hx of    Transfusion history    past history- none recent, after surgeries due to blood loss   Transgender with history of gender affirmation  surgery    Wears glasses    Wears hearing aid    Past Surgical History:  Procedure Laterality Date   ABDOMINAL HYSTERECTOMY Bilateral 1994   TAH, BSO- tranverse incision at 56 yo   ANKLE ARTHROSCOPY WITH RECONSTRUCTION Right 2007   CHOLECYSTECTOMY     laparoscopic   COLONOSCOPY     x3   EYE SURGERY     Left eye 03/02/2018, right 02/15/2018   HIP ARTHROSCOPY W/ LABRAL REPAIR Right 05/11/2013   acetabular labral tear 03/30/2013   KNEE ARTHROPLASTY Right    KNEE JOINT MANIPULATION Left    x3 under anesthesia   KNEE SURGERY Bilateral 1984   Right ACL, left PCL repair   LITHOTRIPSY  2005   LIVER BIOPSY  2013   normal results.   MASTECTOMY Bilateral    prior to 2009   MOUTH SURGERY     NASAL SEPTUM SURGERY N/A 09/20/2015   by ENT Dr. [name]   OVARIAN CYST SURGERY Left    size of grapefruit, was informed that she had shortened vagina   SHOULDER SURGERY Bilateral    Right 08/15/2016, Left 11/15/2016   THUMB ARTHROSCOPY Left    THYROIDECTOMY, PARTIAL Left 2008   TOTAL KNEE ARTHROPLASTY Right 08/23/2018    Procedure: TOTAL KNEE ARTHROPLASTY;  Surgeon: [name], MD;  Location: [facility];  Service: Orthopedics;  Laterality: Right;  26mn   TOTAL KNEE REVISION Left 02/06/2016   Procedure: LEFT TOTAL KNEE REVISION;  Surgeon: FGaynelle Arabian MD;  Location: [facility];  Service: Orthopedics;  Laterality: Left;   TOTAL KNEE REVISION Left 04/22/2017   Procedure: Left knee polyethylene revision;  Surgeon: AGaynelle Arabian MD;  Location: [facility];  Service: Orthopedics;  Laterality: Left;   UPPER GI ENDOSCOPY  2003   Patient Active Problem List   Diagnosis Date Noted   Posterior vitreous detachment of right eye 11/18/2021   Spondylosis of lumbar spine 10/03/2020   Pseudophakia of both eyes 05/07/2020   Retinal telangiectasis of both eyes 05/07/2020   Lattice degeneration of peripheral retina, left 09/08/2019   Lattice degeneration, right eye 09/08/2019   Chronic diastolic heart failure ([place] 05/24/2019   Urinary dysfunction 04/12/2019   Osteoarthritis of right knee 08/23/2018   Constipation due to opioid therapy 03/30/2018   SNHL (sensorineural hearing loss) 12/04/2017   Abnormal urinary stream 12/03/2017   Osteoarthritis of carpometacarpal (CMC) joint of thumb 11/30/2017   Muscle weakness 11/17/2017   Transient vision disturbance 11/12/2017   Bilateral hand pain 10/30/2017   Pain of left hip joint 10/09/2017   Gynecomastia 07/10/2017   Iron deficiency anemia 07/05/2017   Spasticity 05/20/2017   Lumbar radiculitis 04/20/2017   Ulnar 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, left 11/28/2016   Idiopathic peripheral neuropathy 11/28/2016   Failed total knee arthroplasty, 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   Long term (current) use of anticoagulants 08/23/2015   Right upper quadrant abdominal pain 08/23/2015   Memory loss 05/10/2015   Gait abnormality 04/07/2015   Alkaline phosphatase elevation 04/07/2015   History of thrombosis 03/26/2015   Medial epicondylitis 02/07/2015   Cognitive decline 12/21/2014   Leukopenia 12/05/2014    Rotator cuff syndrome of right shoulder 10/27/2014   Status post left knee replacement 08/22/2014   Left lateral epicondylitis 08/22/2014   Chronic cerebral ischemia 08/18/2014   Arthrofibrosis of knee joint 08/17/2014   Cubital canal compression syndrome, left 08/17/2014   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) 03/01/2014   Biceps tendonitis on left 03/01/2014   Polycythemia vera ([place]) 12/27/2013   H/O TIA (transient ischemic attack) and stroke 12/27/2013   Neck pain 12/27/2013   OSA (obstructive sleep apnea) 12/08/2013   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/31/2013   Depression with anxiety 08/04/2013   Protein C deficiency ([place]) 08/01/2013   Post traumatic stress disorder (PTSD) 08/01/2013   Speech abnormality 07/25/2013   Obesity 05/10/2013   Lower extremity edema 05/10/2013   GERD (gastroesophageal reflux disease) 05/10/2013   Arthritis 05/10/2013   OA (osteoarthritis) of knee 03/15/2013   Headache 10/25/2012   Palpitations 10/18/2012   Fatty liver determined by biopsy 06/01/2012   Arthropathic psoriasis, unspecified ([place]) 04/29/2012   Abnormal liver enzymes 03/29/2012   Psoriatic arthritis ([place]) 12/29/2011   Left Renal Hydronephrosis 12/11/2010   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     THERAPY DIAG:  Abnormal posture  Cervicalgia  Cramp and spasm  Rationale for Evaluation and Treatment Rehabilitation  PERTINENT HISTORY: Lumbar stenosis L5-S1, failed TKA 2019, neuropathy in bil feet, PTSD, CVA vs TIA Rt weakness,  head trauma 2019, Rt thumb replacement, bil total knee replacements, bil shoulder scope surgeries  PRECAUTIONS: Fall  SUBJECTIVE: I feel like I need to hand upsidedown PAIN:  Are you having pain? Neck 7/10    OBJECTIVE: (objective measures completed at initial evaluation unless otherwise dated)  DIAGNOSTIC FINDINGS:  MRI on 10/24/21: No interval change of the syrinx (fluid filled cyst) spanning the C4 through C6 levels measuring up to 4 mm in diameter. 2. Mild degenerative changes of the cervical spine without high-grade spinal canal stenosis at any level. 3. Mild to moderate left neural foraminal narrowing at C3-4, unchanged.   PATIENT SURVEYS:  FOTO 40 (52 is goal)   COGNITION: Overall cognitive status: Within functional limits for tasks assessed SENSATION: WFL   POSTURE: rounded shoulders and forward head   PALPATION: Trigger points in bil suboccipitals, upper traps and tension in bil cervical paraspinals.  Pt with reduced segmental mobility C3-7 and T1-5.  Pain reduction with manual cervical traction in supine.              CERVICAL ROM:    Active ROM A/PROM (deg) eval A/ROM 11/11/21  Flexion 50   Extension 30   Right lateral flexion 35   Left lateral flexion 40   Right rotation 60 65  Left rotation 70 80   (Blank rows = not tested)   UPPER EXTREMITY ROM:   Bil UE A/ROM limited by 25% in all directions.    UPPER EXTREMITY MMT:   MMT Right eval Left eval  Shoulder flexion 4-/5 4/5  Shoulder extension 4- 4  Shoulder abduction 4- 4-  Shoulder adduction      Shoulder extension      Shoulder internal rotation 4+ 4+  Shoulder external rotation 4- 4-  Middle trapezius      Lower trapezius      Elbow flexion      Elbow extension      Wrist flexion      Wrist extension      Wrist ulnar deviation      Wrist radial deviation  Wrist pronation      Wrist supination      Grip strength 32 29   (Blank rows = not tested)   TODAY'S TREATMENT:   11/21/21: Arm bike 3 min forward/3 min reverse- PT present to monitor and discuss progress. 1# way raise: 10x each min VC for posture Red band shoulder ER 20x Horizontal abd 15x2 Seated thoracic rotation: 10" hold x 3 each side Cervical traction: 15# intermittent 60 second hold, 10 second rest x15  11/18/21: Arm bike 3 min forward PTA present to monitor. 1# way raise: 10x each min VC for posture Red band shoulder ER 20x Horizontal abd 15x2 Seated scap depressors 5x, then supine arm glides/scap depressors 6x   11/14/21:  Seated:yellow band ER 10x, horizontal abd 10x, scap squeezes 5x. Red band x10 each 3 way shoulder raises 10x 0# Trigger Point Dry-Needling  Treatment instructions: Expect mild to moderate muscle soreness. S/S of pneumothorax if dry needled over a lung field, and to seek immediate medical attention should they occur. Patient verbalized understanding of these instructions and education.  Patient Consent Given: Yes Education handout provided: Yes Muscles treated: bil upper traps, bil cervical multifidi and upper thoracic multifidi Treatment response/outcome: Utilized skilled palpation to identify trigger points.  During dry needling able to palpate muscle twitch and muscle elongation   Skilled palpation and monitoring by PT during dry needling    PATIENT EDUCATION:  Education details: Access Code: [email or web address] Person educated: Patient and Parent Education method: Explanation, Media planner, and Handouts Education comprehension: verbalized understanding and returned demonstration     HOME EXERCISE PROGRAM: Access Code: [email or web address] URL: [email or web address] Date: 10/30/2021 Prepared by: Claiborne Billings   Exercises - Seated Cervical Flexion AROM  - 3 x daily - 7 x weekly - 1 sets - 3 reps - 20 hold - Seated Cervical Sidebending AROM  - 3 x daily - 7 x weekly - 1 sets - 3 reps - 20 hold - Seated Cervical Rotation AROM  - 3 x daily - 7 x weekly - 1 sets - 3 reps -  20 hold - Seated Correct Posture  - 1 x daily - 7 x weekly - 3 sets - 10 reps - Seated Scapular Retraction  - 5 x daily - 7 x weekly - 1 sets - 10 reps - 5 hold   ASSESSMENT:   CLINICAL IMPRESSION: Pt arrived with 7/10 pain today in the neck.  He denies any significant change with DN and PT opted for traction today to address symptoms.  Pt is doing well with postural strength exercises.  Pt requires moderate verbal cueing for head position with seated exercises due for forward head posture.  Verbal and demo cues for scapular relaxation with postural strength.  Pt did well with traction trial today and we will repeat if this is helpful.  Patient will benefit from skilled PT to address the below impairments and improve overall function.      OBJECTIVE IMPAIRMENTS decreased mobility, decreased ROM, decreased strength, hypomobility, increased muscle spasms, impaired flexibility, impaired UE functional use, postural dysfunction, and pain.    ACTIVITY LIMITATIONS lifting, sitting, and reach over head   PARTICIPATION LIMITATIONS: cleaning, laundry, and driving   PERSONAL FACTORS 3+ comorbidities: cervical DDD, CVA, bil LE neuropathy,   are also affecting patient's functional outcome.    REHAB POTENTIAL: Good   CLINICAL DECISION MAKING: Evolving/moderate complexity   EVALUATION COMPLEXITY: Moderate   GOALS: Goals reviewed with patient? Yes   SHORT TERM GOALS: Target date: 11/27/2021  Be independent in initial HEP Baseline:  Goal status: Goal met 11/18/21   2.  Verbalize postural modifications to improve alignment and reduce UE symptoms  Baseline:  Goal status: Goal met 11/18/21   3.  Demonstrate Rt cervical A/ROM rotation to > or = to 70 degrees to improve safety with driving Baseline: 65 Rt, 70 Lt (11/14/21) Goal status: In progress    4.  Report > or = to 30% reduction in neck pain and UE radiculopathy with ADLs and self-care Baseline: 3-7/10 Goal status: INITIAL     LONG TERM  GOALS: Target date: 12/25/2021   Be independent in advanced HEP Baseline:  Goal status: INITIAL   2.  Improve FOTO to > or = to 52  Baseline: 36 Goal status: INITIAL   3.  Improve bil grip to > or = to 45# to improve functional use and endurance with ADLs and self-care Baseline: 32/29 Goal status: INITIAL   4.  Report > or = to 70% reduction in neck pain and UE pain with ADLs and self-care  Baseline: 3-7/10 Goal status: INITIAL   5.  Demonstrate > or = to 80 degrees A/ROM rotation bilaterally to improve safety with driving  Baseline: 21/82 Goal status: INITIAL       PLAN: PT FREQUENCY: 1-2x/week   PT DURATION: 8 weeks   PLANNED INTERVENTIONS: Therapeutic exercises, Therapeutic activity, Neuromuscular re-education, Balance training, Gait training, Patient/Family education, Self Care, Joint mobilization, Joint manipulation, Aquatic Therapy, Dry Needling, Electrical stimulation, Spinal manipulation, Spinal mobilization, Cryotherapy, Moist heat, Taping, Traction, Ultrasound, Manual therapy, and Re-evaluation   PLAN FOR NEXT SESSION: arm bike, cervical traction if good response, progress postural strength    Sigurd Sos, PT 11/21/21 1:02 PM

## 2021-11-22 ENCOUNTER — Other Ambulatory Visit (HOSPITAL_BASED_OUTPATIENT_CLINIC_OR_DEPARTMENT_OTHER): Payer: Self-pay

## 2021-11-25 ENCOUNTER — Encounter (HOSPITAL_BASED_OUTPATIENT_CLINIC_OR_DEPARTMENT_OTHER): Payer: Self-pay

## 2021-11-25 ENCOUNTER — Encounter: Payer: Self-pay | Admitting: Physical Therapy

## 2021-11-25 ENCOUNTER — Ambulatory Visit: Payer: 59 | Admitting: Physical Therapy

## 2021-11-25 ENCOUNTER — Other Ambulatory Visit (HOSPITAL_BASED_OUTPATIENT_CLINIC_OR_DEPARTMENT_OTHER): Payer: Self-pay

## 2021-11-25 DIAGNOSIS — M542 Cervicalgia: Secondary | ICD-10-CM | POA: Diagnosis not present

## 2021-11-25 DIAGNOSIS — R293 Abnormal posture: Secondary | ICD-10-CM

## 2021-11-25 DIAGNOSIS — R252 Cramp and spasm: Secondary | ICD-10-CM

## 2021-11-25 MED ORDER — TESTOSTERONE CYPIONATE 200 MG/ML IM SOLN
INTRAMUSCULAR | 1 refills | Status: DC
  Filled 2021-11-25: qty 10, 70d supply, fill #0
  Filled 2022-02-07 – 2022-02-08 (×3): qty 10, 70d supply, fill #1

## 2021-11-25 MED ORDER — HYDROXYZINE HCL 25 MG PO TABS
12.5000 mg | ORAL_TABLET | Freq: Three times a day (TID) | ORAL | 1 refills | Status: DC
  Filled 2021-11-27 – 2021-12-20 (×2): qty 90, 30d supply, fill #0

## 2021-11-25 NOTE — Therapy (Signed)
OUTPATIENT PHYSICAL THERAPY TREATMENT NOTE   Patient Name: Jesse Bowers MRN: [id number] DOB:1965-05-28, 56 y.o., adult Today's Date: 11/25/2021  PCP: NA-pt reports that he does not currently have a PCP REFERRING PROVIDER: Alger Simons, MD  END OF SESSION:   PT End of Session - 11/25/21 0943     Visit Number 6    Date for PT Re-Evaluation 12/25/21    Authorization Type UMR    PT Start Time [phone]    PT Stop Time 1023    PT Time Calculation (min) 40 min    Activity Tolerance Patient tolerated treatment well    Behavior During Therapy WFL for tasks assessed/performed                 Past Medical History:  Diagnosis Date   Abnormal weight loss    Anxiety    Arthritis    Cataract    OU   Celiac disease    Cervical neck pain with evidence of disc disease    patient has a cyst    Chronic constipation    Chronic diastolic heart failure ([place])    Pt. 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   Family history of adverse reaction to anesthesia    family has problems with anesthesia of nausea and vomiting    GERD (gastroesophageal reflux disease)    History of in 20's   Gluten enteropathy    H/O parotitis    right    Hard of hearing    History of kidney stones    History of retinal tear    Bilateral   History of staph infection    required wound vac   Hx-TIA (transient ischemic attack)    2015   Kidney stones 08/2020   LVH (left ventricular hypertrophy) 12/15/2016   Mild, noted on ECHO   MVP (mitral valve prolapse)    NAFL (nonalcoholic fatty liver)    Neck pain    Neuromuscular disorder ([place])    bilateral neuropathy feet.   Nuclear sclerotic cataract of both eyes 09/08/2019   Pneumonia 12/17/2010   Polycythemia    Polycythemia, secondary     PONV (postoperative nausea and vomiting)    Protein C deficiency (HCC)    Dr. Anne Fu   Psoriasis    16 X10 cm psoriatic rash on sole of left foot ; open and occ scant bleeding;    psoriatic arthritis    PTSD (post-traumatic stress disorder)    Scaphoid fracture of wrist 09/23/2013   Seizure ([place])    childhood, medication until age 72 then weanned completely off   Sleep apnea    split night study last done by Dr. [name] 06/18/15 shows severe OSA, CSA, and hypersomnia, rec bipap   Splenomegaly    Stenosis of ureteropelvic junction (UPJ)    left   Stroke [facility])    CVA vs TIA in left cerebrum causing slight right sided weakness-Dr. 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    Wears glasses    Wears hearing aid    Past Surgical History:  Procedure Laterality Date   ABDOMINAL HYSTERECTOMY Bilateral 1994   TAH, BSO- tranverse incision at 56 yo   ANKLE ARTHROSCOPY WITH RECONSTRUCTION Right 2007   CHOLECYSTECTOMY     laparoscopic   COLONOSCOPY     x3   EYE SURGERY     Left eye 03/02/2018, right 02/15/2018   HIP ARTHROSCOPY W/ LABRAL REPAIR Right 05/11/2013   acetabular labral tear 03/30/2013   KNEE ARTHROPLASTY Right    KNEE JOINT MANIPULATION Left    x3 under anesthesia   KNEE SURGERY Bilateral 1984   Right ACL, left PCL repair   LITHOTRIPSY  2005   LIVER BIOPSY  2013   normal results.   MASTECTOMY Bilateral    prior to 2009   MOUTH SURGERY     NASAL SEPTUM SURGERY N/A 09/20/2015   by ENT Dr. [name]   OVARIAN CYST SURGERY Left    size of grapefruit, was informed that she had shortened vagina   SHOULDER SURGERY Bilateral    Right 08/15/2016, Left 11/15/2016   THUMB ARTHROSCOPY Left    THYROIDECTOMY, PARTIAL Left 2008   TOTAL KNEE ARTHROPLASTY Right 08/23/2018    Procedure: TOTAL KNEE ARTHROPLASTY;  Surgeon: [name], MD;  Location: [facility];  Service: Orthopedics;  Laterality: Right;  17mn   TOTAL KNEE REVISION Left 02/06/2016   Procedure: LEFT TOTAL KNEE REVISION;  Surgeon: FGaynelle Arabian MD;  Location: [facility];  Service: Orthopedics;  Laterality: Left;   TOTAL KNEE REVISION Left 04/22/2017   Procedure: Left knee polyethylene revision;  Surgeon: AGaynelle Arabian MD;  Location: [facility];  Service: Orthopedics;  Laterality: Left;   UPPER GI ENDOSCOPY  2003   Patient Active Problem List   Diagnosis Date Noted   Posterior vitreous detachment of right eye 11/18/2021   Spondylosis of lumbar spine 10/03/2020   Pseudophakia of both eyes 05/07/2020   Retinal telangiectasis of both eyes 05/07/2020   Lattice degeneration of peripheral retina, left 09/08/2019   Lattice degeneration, right eye 09/08/2019   Chronic diastolic heart failure [place] 05/24/2019   Urinary dysfunction 04/12/2019   Osteoarthritis of right knee 08/23/2018   Constipation due to opioid therapy 03/30/2018   SNHL (sensorineural hearing loss) 12/04/2017   Abnormal urinary stream 12/03/2017   Osteoarthritis of carpometacarpal (CMC) joint of thumb 11/30/2017   Muscle weakness 11/17/2017   Transient vision disturbance 11/12/2017   Bilateral hand pain 10/30/2017   Pain of left hip joint 10/09/2017   Gynecomastia 07/10/2017   Iron deficiency anemia 07/05/2017   Spasticity 05/20/2017   Lumbar radiculitis 04/20/2017   Ulnar 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, left 11/28/2016   Idiopathic peripheral neuropathy 11/28/2016   Failed total knee arthroplasty, 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   Long term (current) use of anticoagulants 08/23/2015   Right upper quadrant abdominal pain 08/23/2015   Memory loss 05/10/2015   Gait abnormality 04/07/2015   Alkaline phosphatase elevation 04/07/2015   History of thrombosis 03/26/2015   Medial epicondylitis 02/07/2015   Cognitive decline 12/21/2014   Leukopenia 12/05/2014    Rotator cuff syndrome of right shoulder 10/27/2014   Status post left knee replacement 08/22/2014   Left lateral epicondylitis 08/22/2014   Chronic cerebral ischemia 08/18/2014   Arthrofibrosis of knee joint 08/17/2014   Cubital canal compression syndrome, left 08/17/2014   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[place] 04/10/2014   Chronic pain syndrome 04/10/2014   Insomnia 04/10/2014   Chronic non-specific white matter lesions on  MRI 04/10/2014   CFS (chronic fatigue syndrome) 04/10/2014   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[place]) 03/01/2014   Biceps tendonitis on left 03/01/2014   Polycythemia vera ([place]) 12/27/2013   H/O TIA (transient ischemic attack) and stroke 12/27/2013   Neck pain 12/27/2013   OSA (obstructive sleep apnea) 12/08/2013   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/31/2013   Depression with anxiety 08/04/2013   Protein C deficiency ([place]) 08/01/2013   Post traumatic stress disorder (PTSD) 08/01/2013   Speech abnormality 07/25/2013   Obesity 05/10/2013   Lower extremity edema 05/10/2013   GERD (gastroesophageal reflux disease) 05/10/2013   Arthritis 05/10/2013   OA (osteoarthritis) of knee 03/15/2013   Headache 10/25/2012   Palpitations 10/18/2012   Fatty liver determined by biopsy 06/01/2012   Arthropathic psoriasis, unspecified ([place]) 04/29/2012   Abnormal liver enzymes 03/29/2012   Psoriatic arthritis ([place]) 12/29/2011   Left Renal Hydronephrosis 12/11/2010   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     THERAPY DIAG:  Abnormal posture  Cervicalgia  Cramp and spasm  Rationale for Evaluation and Treatment Rehabilitation  PERTINENT HISTORY: Lumbar stenosis L5-S1, failed TKA 2019, neuropathy in bil feet, PTSD, CVA vs TIA Rt weakness,  head trauma 2019, Rt thumb replacement, bil total knee replacements, bil shoulder scope surgeries  PRECAUTIONS: Fall  SUBJECTIVE: I liked the traction but it can probably pull more.  PAIN:  Are you having pain? Everywhere because I just got up.  6/10    OBJECTIVE: (objective measures completed at initial evaluation unless otherwise dated)  DIAGNOSTIC FINDINGS:  MRI on 10/24/21: No interval change of the syrinx (fluid filled cyst) spanning the C4 through C6 levels measuring up to 4 mm in diameter. 2. Mild degenerative changes of the cervical spine without high-grade spinal canal stenosis at any level. 3. Mild to moderate left neural foraminal narrowing at C3-4, unchanged.   PATIENT SURVEYS:  FOTO 75 (52 is goal)   COGNITION: Overall cognitive status: Within functional limits for tasks assessed SENSATION: WFL   POSTURE: rounded shoulders and forward head   PALPATION: Trigger points in bil suboccipitals, upper traps and tension in bil cervical paraspinals.  Pt with reduced segmental mobility C3-7 and T1-5.  Pain reduction with manual cervical traction in supine.              CERVICAL ROM:    Active ROM A/PROM (deg) eval A/ROM 11/11/21  Flexion 50   Extension 30   Right lateral flexion 35   Left lateral flexion 40   Right rotation 60 65  Left rotation 70 80   (Blank rows = not tested)   UPPER EXTREMITY ROM:   Bil UE A/ROM limited by 25% in all directions.    UPPER EXTREMITY MMT:   MMT Right eval Left eval  Shoulder flexion 4-/5 4/5  Shoulder extension 4- 4  Shoulder abduction 4- 4-  Shoulder adduction      Shoulder extension      Shoulder internal rotation 4+ 4+  Shoulder external rotation 4- 4-  Middle trapezius      Lower trapezius      Elbow flexion      Elbow extension      Wrist flexion      Wrist extension      Wrist ulnar deviation  Wrist radial deviation      Wrist pronation      Wrist supination      Grip strength 32 29   (Blank rows =  not tested)   TODAY'S TREATMENT:  11/25/21; Arm bike 3 min forward/3 min reverse- PTA present to monitor and discuss progress 2# 3 way raise 10x Bicep Curls 3# 2# 10x Cervical traction: 16# intermittent 60 second hold, 10 second rest x15  11/21/21: Arm bike 3 min forward/3 min reverse- PT present to monitor and discuss progress. 1# way raise: 10x each min VC for posture Red band shoulder ER 20x Horizontal abd 15x2 Seated thoracic rotation: 10" hold x 3 each side Cervical traction: 15# intermittent 60 second hold, 10 second rest x15  11/18/21: Arm bike 3 min forward PTA present to monitor. 1# way raise: 10x each min VC for posture Red band shoulder ER 20x Horizontal abd 15x2 Seated scap depressors 5x, then supine arm glides/scap depressors 6x      PATIENT EDUCATION:  Education details: Access Code: [email or web address] Person educated: Patient and Parent Education method: Explanation, Demonstration, and Handouts Education comprehension: verbalized understanding and returned demonstration     HOME EXERCISE PROGRAM: Access Code: [email or web address] URL: [email or web address] Date: 10/30/2021 Prepared by: Claiborne Billings   Exercises - Seated Cervical Flexion AROM  - 3 x daily - 7 x weekly - 1 sets - 3 reps - 20 hold - Seated Cervical Sidebending AROM  - 3 x daily - 7 x weekly - 1 sets - 3 reps - 20 hold - Seated Cervical Rotation AROM  - 3 x daily - 7 x weekly - 1 sets - 3 reps - 20 hold - Seated Correct Posture  - 1 x daily - 7 x weekly - 3 sets - 10 reps - Seated Scapular Retraction  - 5 x daily - 7 x weekly - 1 sets - 10 reps - 5 hold   ASSESSMENT:   CLINICAL IMPRESSION: Pt felt like there were no negative effects from the mechanical traction and in fact feel he can tolerate more pull which we did today. Pt is compliant with green band exercises.     OBJECTIVE IMPAIRMENTS decreased mobility, decreased ROM, decreased strength, hypomobility, increased muscle spasms, impaired flexibility,  impaired UE functional use, postural dysfunction, and pain.    ACTIVITY LIMITATIONS lifting, sitting, and reach over head   PARTICIPATION LIMITATIONS: cleaning, laundry, and driving   PERSONAL FACTORS 3+ comorbidities: cervical DDD, CVA, bil LE neuropathy,   are also affecting patient's functional outcome.    REHAB POTENTIAL: Good   CLINICAL DECISION MAKING: Evolving/moderate complexity   EVALUATION COMPLEXITY: Moderate   GOALS: Goals reviewed with patient? Yes   SHORT TERM GOALS: Target date: 11/27/2021    Be independent in initial HEP Baseline:  Goal status: Goal met 11/18/21   2.  Verbalize postural modifications to improve alignment and reduce UE symptoms  Baseline:  Goal status: Goal met 11/18/21   3.  Demonstrate Rt cervical A/ROM rotation to > or = to 70 degrees to improve safety with driving Baseline: 65 Rt, 70 Lt (11/14/21) Goal status: In progress    4.  Report > or = to 30% reduction in neck pain and UE radiculopathy with ADLs and self-care Baseline: 3-7/10 Goal status: INITIAL     LONG TERM GOALS: Target date: 12/25/2021   Be independent in advanced HEP Baseline:  Goal status: INITIAL   2.  Improve FOTO to > or = to 52  Baseline: 36 Goal status:  INITIAL   3.  Improve bil grip to > or = to 45# to improve functional use and endurance with ADLs and self-care Baseline: 32/29 Goal status: INITIAL   4.  Report > or = to 70% reduction in neck pain and UE pain with ADLs and self-care  Baseline: 3-7/10 Goal status: INITIAL   5.  Demonstrate > or = to 80 degrees A/ROM rotation bilaterally to improve safety with driving  Baseline: 86/16 Goal status: INITIAL       PLAN: PT FREQUENCY: 1-2x/week   PT DURATION: 8 weeks   PLANNED INTERVENTIONS: Therapeutic exercises, Therapeutic activity, Neuromuscular re-education, Balance training, Gait training, Patient/Family education, Self Care, Joint mobilization, Joint manipulation, Aquatic Therapy, Dry Needling,  Electrical stimulation, Spinal manipulation, Spinal mobilization, Cryotherapy, Moist heat, Taping, Traction, Ultrasound, Manual therapy, and Re-evaluation   PLAN FOR NEXT SESSION: arm bike, assess cervical traction and increase pull if indicated, progress postural strength    Myrene Galas, PTA 11/25/21 10:08 AM

## 2021-11-26 ENCOUNTER — Other Ambulatory Visit (HOSPITAL_BASED_OUTPATIENT_CLINIC_OR_DEPARTMENT_OTHER): Payer: Self-pay

## 2021-11-26 ENCOUNTER — Other Ambulatory Visit: Payer: Self-pay | Admitting: Physical Medicine & Rehabilitation

## 2021-11-26 ENCOUNTER — Encounter (HOSPITAL_BASED_OUTPATIENT_CLINIC_OR_DEPARTMENT_OTHER): Payer: Self-pay | Admitting: Pharmacist

## 2021-11-26 DIAGNOSIS — M47816 Spondylosis without myelopathy or radiculopathy, lumbar region: Secondary | ICD-10-CM

## 2021-11-26 DIAGNOSIS — L4050 Arthropathic psoriasis, unspecified: Secondary | ICD-10-CM

## 2021-11-26 MED ORDER — OXYCODONE HCL 10 MG PO TABS
10.0000 mg | ORAL_TABLET | Freq: Three times a day (TID) | ORAL | 0 refills | Status: DC | PRN
  Filled 2021-11-26: qty 70, 24d supply, fill #0

## 2021-11-27 ENCOUNTER — Other Ambulatory Visit (HOSPITAL_BASED_OUTPATIENT_CLINIC_OR_DEPARTMENT_OTHER): Payer: Self-pay

## 2021-11-27 MED FILL — Potassium Chloride Tab ER 10 mEq: ORAL | 90 days supply | Qty: 180 | Fill #1 | Status: CN

## 2021-11-29 ENCOUNTER — Ambulatory Visit: Payer: 59 | Admitting: Psychologist

## 2021-12-01 ENCOUNTER — Other Ambulatory Visit (HOSPITAL_BASED_OUTPATIENT_CLINIC_OR_DEPARTMENT_OTHER): Payer: Self-pay

## 2021-12-02 ENCOUNTER — Ambulatory Visit: Payer: 59 | Attending: Physical Medicine & Rehabilitation | Admitting: Physical Therapy

## 2021-12-02 ENCOUNTER — Other Ambulatory Visit (HOSPITAL_BASED_OUTPATIENT_CLINIC_OR_DEPARTMENT_OTHER): Payer: Self-pay

## 2021-12-02 ENCOUNTER — Encounter: Payer: Self-pay | Admitting: Physical Therapy

## 2021-12-02 DIAGNOSIS — R293 Abnormal posture: Secondary | ICD-10-CM | POA: Diagnosis present

## 2021-12-02 DIAGNOSIS — R252 Cramp and spasm: Secondary | ICD-10-CM

## 2021-12-02 DIAGNOSIS — M542 Cervicalgia: Secondary | ICD-10-CM | POA: Diagnosis present

## 2021-12-02 NOTE — Therapy (Signed)
OUTPATIENT PHYSICAL THERAPY TREATMENT NOTE   Patient Name: Jesse Bowers MRN: [id number] DOB:Aug 01, 1965, 56 y.o., adult Today's Date: 12/02/2021  PCP: NA-pt reports that he does not currently have a PCP REFERRING PROVIDER: Alger Simons, MD  END OF SESSION:   PT End of Session - 12/02/21 1534     Visit Number 7    Date for PT Re-Evaluation 12/25/21    Authorization Type UMR    PT Start Time 1534    PT Stop Time 1612    PT Time Calculation (min) 38 min    Activity Tolerance Patient tolerated treatment well;Patient limited by lethargy    Behavior During Therapy WFL for tasks assessed/performed                  Past Medical History:  Diagnosis Date   Abnormal weight loss    Anxiety    Arthritis    Cataract    OU   Celiac disease    Cervical neck pain with evidence of disc disease    patient has a cyst    Chronic constipation    Chronic diastolic heart failure ([place])    Pt. 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   Family history of adverse reaction to anesthesia    family has problems with anesthesia of nausea and vomiting    GERD (gastroesophageal reflux disease)    History of in 20's   Gluten enteropathy    H/O parotitis    right    Hard of hearing    History of kidney stones    History of retinal tear    Bilateral   History of staph infection    required wound vac   Hx-TIA (transient ischemic attack)    2015   Kidney stones 08/2020   LVH (left ventricular hypertrophy) 12/15/2016   Mild, noted on ECHO   MVP (mitral valve prolapse)    NAFL (nonalcoholic fatty liver)    Neck pain    Neuromuscular disorder ([place])    bilateral neuropathy feet.   Nuclear sclerotic cataract of both eyes 09/08/2019   Pneumonia 12/17/2010    Polycythemia    Polycythemia, secondary    PONV (postoperative nausea and vomiting)    Protein C deficiency (HCC)    Dr. Anne Fu   Psoriasis    16 X10 cm psoriatic rash on sole of left foot ; open and occ scant bleeding;    psoriatic arthritis    PTSD (post-traumatic stress disorder)    Scaphoid fracture of wrist 09/23/2013   Seizure ([place])    childhood, medication until age 71 then weanned completely off   Sleep apnea    split night study last done by Dr. [name] 06/18/15 shows severe OSA, CSA, and hypersomnia, rec bipap   Splenomegaly    Stenosis of ureteropelvic junction (UPJ)    left   Stroke [facility])    CVA vs TIA in left cerebrum causing slight right sided weakness-Dr. [name] follows   Syrinx of spinal cord ([place]) 01/06/2014   c spine on MRI   Tachycardia    hx of    Transfusion history    past history- none recent, after surgeries due to blood loss   Transgender  with history of gender affirmation surgery    Wears glasses    Wears hearing aid    Past Surgical History:  Procedure Laterality Date   ABDOMINAL HYSTERECTOMY Bilateral 1994   TAH, BSO- tranverse incision at 56 yo   ANKLE ARTHROSCOPY WITH RECONSTRUCTION Right 2007   CHOLECYSTECTOMY     laparoscopic   COLONOSCOPY     x3   EYE SURGERY     Left eye 03/02/2018, right 02/15/2018   HIP ARTHROSCOPY W/ LABRAL REPAIR Right 05/11/2013   acetabular labral tear 03/30/2013   KNEE ARTHROPLASTY Right    KNEE JOINT MANIPULATION Left    x3 under anesthesia   KNEE SURGERY Bilateral 1984   Right ACL, left PCL repair   LITHOTRIPSY  2005   LIVER BIOPSY  2013   normal results.   MASTECTOMY Bilateral    prior to 2009   MOUTH SURGERY     NASAL SEPTUM SURGERY N/A 09/20/2015   by ENT Dr. [name]   OVARIAN CYST SURGERY Left    size of grapefruit, was informed that she had shortened vagina   SHOULDER SURGERY Bilateral    Right 08/15/2016, Left 11/15/2016   THUMB ARTHROSCOPY Left    THYROIDECTOMY, PARTIAL Left 2008   TOTAL KNEE  ARTHROPLASTY Right 08/23/2018   Procedure: TOTAL KNEE ARTHROPLASTY;  Surgeon: [name], MD;  Location: [facility];  Service: Orthopedics;  Laterality: Right;  11mn   TOTAL KNEE REVISION Left 02/06/2016   Procedure: LEFT TOTAL KNEE REVISION;  Surgeon: FGaynelle Arabian MD;  Location: [facility];  Service: Orthopedics;  Laterality: Left;   TOTAL KNEE REVISION Left 04/22/2017   Procedure: Left knee polyethylene revision;  Surgeon: AGaynelle Arabian MD;  Location: [facility];  Service: Orthopedics;  Laterality: Left;   UPPER GI ENDOSCOPY  2003   Patient Active Problem List   Diagnosis Date Noted   Posterior vitreous detachment of right eye 11/18/2021   Spondylosis of lumbar spine 10/03/2020   Pseudophakia of both eyes 05/07/2020   Retinal telangiectasis of both eyes 05/07/2020   Lattice degeneration of peripheral retina, left 09/08/2019   Lattice degeneration, right eye 09/08/2019   Chronic diastolic heart failure ([place] 05/24/2019   Urinary dysfunction 04/12/2019   Osteoarthritis of right knee 08/23/2018   Constipation due to opioid therapy 03/30/2018   SNHL (sensorineural hearing loss) 12/04/2017   Abnormal urinary stream 12/03/2017   Osteoarthritis of carpometacarpal (CMC) joint of thumb 11/30/2017   Muscle weakness 11/17/2017   Transient vision disturbance 11/12/2017   Bilateral hand pain 10/30/2017   Pain of left hip joint 10/09/2017   Gynecomastia 07/10/2017   Iron deficiency anemia 07/05/2017   Spasticity 05/20/2017   Lumbar radiculitis 04/20/2017   Ulnar 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, left 11/28/2016   Idiopathic peripheral neuropathy 11/28/2016   Failed total knee arthroplasty, 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   Long term (current) use of anticoagulants 08/23/2015   Right upper quadrant abdominal pain 08/23/2015   Memory loss 05/10/2015   Gait abnormality 04/07/2015   Alkaline phosphatase elevation 04/07/2015   History of thrombosis 03/26/2015   Medial epicondylitis 02/07/2015   Cognitive decline 12/21/2014    Leukopenia 12/05/2014   Rotator cuff syndrome of right shoulder 10/27/2014   Status post left knee replacement 08/22/2014   Left lateral epicondylitis 08/22/2014   Chronic cerebral ischemia 08/18/2014   Arthrofibrosis of knee joint 08/17/2014   Cubital canal compression syndrome, left 08/17/2014   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   Chronic pain syndrome 04/10/2014   Insomnia 04/10/2014   Chronic non-specific  white matter lesions on MRI 04/10/2014   CFS (chronic fatigue syndrome) 04/10/2014   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[place]) 03/01/2014   Biceps tendonitis on left 03/01/2014   Polycythemia vera ([place]) 12/27/2013   H/O TIA (transient ischemic attack) and stroke 12/27/2013   Neck pain 12/27/2013   OSA (obstructive sleep apnea) 12/08/2013   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/31/2013   Depression with anxiety 08/04/2013   Protein C deficiency ([place]) 08/01/2013   Post traumatic stress disorder (PTSD) 08/01/2013   Speech abnormality 07/25/2013   Obesity 05/10/2013   Lower extremity edema 05/10/2013   GERD (gastroesophageal reflux disease) 05/10/2013   Arthritis 05/10/2013   OA (osteoarthritis) of knee 03/15/2013   Headache 10/25/2012   Palpitations 10/18/2012   Fatty liver determined by biopsy 06/01/2012   Arthropathic psoriasis, unspecified ([place]) 04/29/2012   Abnormal liver enzymes 03/29/2012   Psoriatic arthritis ([place]) 12/29/2011   Left Renal Hydronephrosis 12/11/2010   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     THERAPY DIAG:  Abnormal posture  Cervicalgia  Cramp and spasm  Rationale for Evaluation and Treatment Rehabilitation  PERTINENT HISTORY: Lumbar stenosis L5-S1, failed TKA 2019, neuropathy in bil feet,  PTSD, CVA vs TIA Rt weakness, head trauma 2019, Rt thumb replacement, bil total knee replacements, bil shoulder scope surgeries  PRECAUTIONS: Fall  SUBJECTIVE: My neck ROM is better, my strength in my arms is getting better and my posture is better. I have resumed my original cervical exercises but I do the laying down.   PAIN:  Are you having pain? I have a headache today.    OBJECTIVE: (objective measures completed at initial evaluation unless otherwise dated)  DIAGNOSTIC FINDINGS:  MRI on 10/24/21: No interval change of the syrinx (fluid filled cyst) spanning the C4 through C6 levels measuring up to 4 mm in diameter. 2. Mild degenerative changes of the cervical spine without high-grade spinal canal stenosis at any level. 3. Mild to moderate left neural foraminal narrowing at C3-4, unchanged.   PATIENT SURVEYS:  FOTO 53 (52 is goal)   COGNITION: Overall cognitive status: Within functional limits for tasks assessed SENSATION: WFL   POSTURE: rounded shoulders and forward head   PALPATION: Trigger points in bil suboccipitals, upper traps and tension in bil cervical paraspinals.  Pt with reduced segmental mobility C3-7 and T1-5.  Pain reduction with manual cervical traction in supine.              CERVICAL ROM:    Active ROM A/PROM (deg) eval A/ROM 11/11/21  Flexion 50   Extension 30   Right lateral flexion 35   Left lateral flexion 40   Right rotation 60 65  Left rotation 70 80   (Blank rows = not tested)   UPPER EXTREMITY ROM:   Bil UE A/ROM limited by 25% in all directions.    UPPER EXTREMITY MMT:   MMT Right eval Left eval  Shoulder flexion 4-/5 4/5  Shoulder extension 4- 4  Shoulder abduction 4- 4-  Shoulder adduction      Shoulder extension      Shoulder internal rotation 4+ 4+  Shoulder external rotation 4- 4-  Middle trapezius      Lower trapezius      Elbow flexion      Elbow extension  Wrist flexion      Wrist extension      Wrist ulnar  deviation      Wrist radial deviation      Wrist pronation      Wrist supination      Grip strength 32 29   (Blank rows = not tested)   TODAY'S TREATMENT:   12/02/21: Arm bike L1.3  min forward/3 min reverse- PTA present to monitor and discuss progress 2# 3 way raise 10x Bicep Curls 4#  3x10x Cervical traction: 17# intermittent 60 second hold, 10 second rest x15  11/25/21; Arm bike 3 min forward/3 min reverse- PTA present to monitor and discuss progress 2# 3 way raise 10x Bicep Curls 3# 2# 10x Cervical traction: 16# intermittent 60 second hold, 10 second rest x15  11/21/21: Arm bike 3 min forward/3 min reverse- PT present to monitor and discuss progress. 1# way raise: 10x each min VC for posture Red band shoulder ER 20x Horizontal abd 15x2 Seated thoracic rotation: 10" hold x 3 each side Cervical traction: 15# intermittent 60 second hold, 10 second rest x15    PATIENT EDUCATION:  Education details: Access Code: [email or web address] Person educated: Patient and Parent Education method: Explanation, Demonstration, and Handouts Education comprehension: verbalized understanding and returned demonstration     HOME EXERCISE PROGRAM: Access Code: [email or web address] URL: [email or web address] Date: 10/30/2021 Prepared by: Claiborne Billings   Exercises - Seated Cervical Flexion AROM  - 3 x daily - 7 x weekly - 1 sets - 3 reps - 20 hold - Seated Cervical Sidebending AROM  - 3 x daily - 7 x weekly - 1 sets - 3 reps - 20 hold - Seated Cervical Rotation AROM  - 3 x daily - 7 x weekly - 1 sets - 3 reps - 20 hold - Seated Correct Posture  - 1 x daily - 7 x weekly - 3 sets - 10 reps - Seated Scapular Retraction  - 5 x daily - 7 x weekly - 1 sets - 10 reps - 5 hold   ASSESSMENT:   CLINICAL IMPRESSION:   Pt reports pain is down 20%, postural awareness is better, neck AROM improved (per pt report today) and arm strength feels stronger. Traction was increased today with no adverse effects.    OBJECTIVE  IMPAIRMENTS decreased mobility, decreased ROM, decreased strength, hypomobility, increased muscle spasms, impaired flexibility, impaired UE functional use, postural dysfunction, and pain.    ACTIVITY LIMITATIONS lifting, sitting, and reach over head   PARTICIPATION LIMITATIONS: cleaning, laundry, and driving   PERSONAL FACTORS 3+ comorbidities: cervical DDD, CVA, bil LE neuropathy,   are also affecting patient's functional outcome.    REHAB POTENTIAL: Good   CLINICAL DECISION MAKING: Evolving/moderate complexity   EVALUATION COMPLEXITY: Moderate   GOALS: Goals reviewed with patient? Yes   SHORT TERM GOALS: Target date: 11/27/2021    Be independent in initial HEP Baseline:  Goal status: Goal met 11/18/21   2.  Verbalize postural modifications to improve alignment and reduce UE symptoms  Baseline:  Goal status: Goal met 11/18/21   3.  Demonstrate Rt cervical A/ROM rotation to > or = to 70 degrees to improve safety with driving Baseline: 65 Rt, 70 Lt (11/14/21) Goal status: In progress    4.  Report > or = to 30% reduction in neck pain and UE radiculopathy with ADLs and self-care Baseline: 3-7/10 Goal status: 20% 12/02/21    LONG TERM GOALS: Target date: 12/25/2021   Be independent in advanced HEP Baseline:  Goal status: INITIAL   2.  Improve FOTO to > or = to 52  Baseline: 36 Goal status: INITIAL   3.  Improve bil grip to > or = to 45# to improve functional use and endurance with ADLs and self-care Baseline: 32/29 Goal status: INITIAL   4.  Report > or = to 70% reduction in neck pain and UE pain with ADLs and self-care  Baseline: 3-7/10 Goal status: INITIAL   5.  Demonstrate > or = to 80 degrees A/ROM rotation bilaterally to improve safety with driving  Baseline: 95/58 Goal status: INITIAL       PLAN: PT FREQUENCY: 1-2x/week   PT DURATION: 8 weeks   PLANNED INTERVENTIONS: Therapeutic exercises, Therapeutic activity, Neuromuscular re-education, Balance  training, Gait training, Patient/Family education, Self Care, Joint mobilization, Joint manipulation, Aquatic Therapy, Dry Needling, Electrical stimulation, Spinal manipulation, Spinal mobilization, Cryotherapy, Moist heat, Taping, Traction, Ultrasound, Manual therapy, and Re-evaluation   PLAN FOR NEXT SESSION: arm bike, assess cervical traction and increase pull if indicated, progress postural strength    Myrene Galas, PTA 12/02/21 3:59 PM

## 2021-12-04 ENCOUNTER — Encounter: Payer: Self-pay | Admitting: Physical Medicine & Rehabilitation

## 2021-12-04 ENCOUNTER — Encounter: Payer: 59 | Attending: Physical Medicine & Rehabilitation | Admitting: Physical Medicine & Rehabilitation

## 2021-12-04 ENCOUNTER — Other Ambulatory Visit (HOSPITAL_BASED_OUTPATIENT_CLINIC_OR_DEPARTMENT_OTHER): Payer: Self-pay

## 2021-12-04 VITALS — BP 115/63 | HR 88 | Ht 69.0 in

## 2021-12-04 DIAGNOSIS — G609 Hereditary and idiopathic neuropathy, unspecified: Secondary | ICD-10-CM

## 2021-12-04 DIAGNOSIS — L4050 Arthropathic psoriasis, unspecified: Secondary | ICD-10-CM

## 2021-12-04 DIAGNOSIS — M542 Cervicalgia: Secondary | ICD-10-CM

## 2021-12-04 DIAGNOSIS — G894 Chronic pain syndrome: Secondary | ICD-10-CM

## 2021-12-04 DIAGNOSIS — M5416 Radiculopathy, lumbar region: Secondary | ICD-10-CM | POA: Diagnosis not present

## 2021-12-04 DIAGNOSIS — M47816 Spondylosis without myelopathy or radiculopathy, lumbar region: Secondary | ICD-10-CM | POA: Diagnosis present

## 2021-12-04 MED ORDER — MORPHINE SULFATE ER 30 MG PO TBCR
30.0000 mg | EXTENDED_RELEASE_TABLET | Freq: Two times a day (BID) | ORAL | 0 refills | Status: DC
  Filled 2021-12-04 – 2021-12-06 (×2): qty 60, 30d supply, fill #0

## 2021-12-04 NOTE — Progress Notes (Signed)
Subjective:    Patient ID: Jesse Bowers, adult    DOB: 1965/12/25, 56 y.o.   MRN: [id number]  HPI  Jesse Bowers is here in follow up of his chronic pain. He has been experiencing increased sensory loss in the 2nd, 3rd, 4th toes of his right foot. The other toes of the right foot and left foot just feel "tingly. He has a hx of a thyroid nodule but no diabetes. We've diagnosed him with an idiopathic peripheral neuropathy in the setting of his other central neurological issues including CVA and cervical myelopathy/radiculopathy  He is working on outpt therapy and has had success with cervical traction but not so much with dry needling. He's at 16lbs for decompression at this point. He tries to stay as active as he can. He uses his powered w/c for longer dx  For pain he is down to ms contin every 12 hours and oxycodone 21m q8 prn.     Pain Inventory Average Pain 6 Pain Right Now 7 My pain is intermittent, burning, dull, tingling, and aching  LOCATION OF PAIN  head, neck, wrist, fingers, back, hip  BOWEL Number of stools per week: 6 Oral laxative use Yes  Type of laxative Isbrela  BLADDER Normal  Bladder incontinence No  Frequent urination Yes  Leakage with coughing No  Difficulty starting stream No  Incomplete bladder emptying No    Mobility walk with assistance how many minutes can you walk? 10 ability to climb steps?  yes do you drive?  yes use a wheelchair transfers alone Do you have any goals in this area?  yes  Function disabled: date disabled 05/17/12 I need assistance with the following:  meal prep, household duties, and shopping Do you have any goals in this area?  yes  Neuro/Psych weakness numbness tingling trouble walking spasms anxiety loss of taste or smell  Prior Studies Any changes since last visit?  no  Physicians involved in your care Any changes since last visit?  no   Family History  Problem Relation Age of Onset   Stroke Maternal  Grandfather        5102  Heart attack Maternal Grandfather    Glaucoma Maternal Grandfather    Macular degeneration Maternal Grandfather    Breast cancer Sister    Hypertension Mother    Psoriasis Mother    Other Mother        meningioma developed ~2019   Glaucoma Mother    Cancer Paternal Grandfather    Heart attack Paternal Grandfather    Stroke Paternal Uncle        age 56  Polycythemia Paternal Uncle    Stroke Maternal Grandmother    Congestive Heart Failure Maternal Grandmother    Heart attack Maternal Grandmother    Protein C deficiency Sister 329      Miscarriages   Breast cancer Maternal Aunt 357  Social History   Socioeconomic History   Marital status: Married    Spouse name: Not on file   Number of children: 2   Years of education: 4y college   Highest education level: Not on file  Occupational History   Occupation: Pediatric Nurse practitioner    Comment: Not working since CVA 2015  Tobacco Use   Smoking status: Never   Smokeless tobacco: Never  Vaping Use   Vaping Use: Never used  Substance and Sexual Activity   Alcohol use: Yes    Comment: social   Drug use: No  Sexual activity: Yes    Birth control/protection: None    Comment: patient is a transgender on testosterone shots, no biological kids  Other Topics Concern   Not on file  Social History Narrative   Education 4 year college, former Therapist, sports X 15 years, pediatric nurse practitioner x 6 years, did NP degree from [employer] [place]. Relocated to [place] about 2 months ago from [place], [place]. Patient was in [place] for last 4 years and prior to that in [place]. His wife is working as Scientist, research (physical sciences) for [employer]. Patient is not working and applying for disability. They have 2 kids but no biologic children.    Social Determinants of Health   Financial Resource Strain: Not on file  Food Insecurity: Not on file  Transportation Needs: Not on file  Physical Activity: Not on file  Stress: Not on file   Social Connections: Not on file   Past Surgical History:  Procedure Laterality Date   ABDOMINAL HYSTERECTOMY Bilateral 1994   TAH, BSO- tranverse incision at 56 yo   ANKLE ARTHROSCOPY WITH RECONSTRUCTION Right 2007   CHOLECYSTECTOMY     laparoscopic   COLONOSCOPY     x3   EYE SURGERY     Left eye 03/02/2018, right 02/15/2018   HIP ARTHROSCOPY W/ LABRAL REPAIR Right 05/11/2013   acetabular labral tear 03/30/2013   KNEE ARTHROPLASTY Right    KNEE JOINT MANIPULATION Left    x3 under anesthesia   KNEE SURGERY Bilateral 1984   Right ACL, left PCL repair   LITHOTRIPSY  2005   LIVER BIOPSY  2013   normal results.   MASTECTOMY Bilateral    prior to 2009   MOUTH SURGERY     NASAL SEPTUM SURGERY N/A 09/20/2015   by ENT Dr. [name]   OVARIAN CYST SURGERY Left    size of grapefruit, was informed that she had shortened vagina   SHOULDER SURGERY Bilateral    Right 08/15/2016, Left 11/15/2016   THUMB ARTHROSCOPY Left    THYROIDECTOMY, PARTIAL Left 2008   TOTAL KNEE ARTHROPLASTY Right 08/23/2018   Procedure: TOTAL KNEE ARTHROPLASTY;  Surgeon: [name], MD;  Location: [facility];  Service: Orthopedics;  Laterality: Right;  65mn   TOTAL KNEE REVISION Left 02/06/2016   Procedure: LEFT TOTAL KNEE REVISION;  Surgeon: FGaynelle Arabian MD;  Location: [facility];  Service: Orthopedics;  Laterality: Left;   TOTAL KNEE REVISION Left 04/22/2017   Procedure: Left knee polyethylene revision;  Surgeon: AGaynelle Arabian MD;  Location: [facility];  Service: Orthopedics;  Laterality: Left;   UPPER GI ENDOSCOPY  2003   Past Medical History:  Diagnosis Date   Abnormal weight loss    Anxiety    Arthritis    Cataract    OU   Celiac disease    Cervical neck pain with evidence of disc disease    patient has a cyst    Chronic constipation    Chronic diastolic heart failure (HCC)    Pt. denies   Chronic pain    Degenerative disc disease at L5-S1 level    with stenosis   DVT (deep venous thrombosis) (HCC)     Right upper arm, bilateral leg   Eczema    inguinal, feet   Elevated liver enzymes    Failed total knee arthroplasty ([place] 04/22/2017   Family history of adverse reaction to anesthesia    family has problems with anesthesia of nausea and vomiting    GERD (gastroesophageal reflux disease)  History of in 20's   Gluten enteropathy    H/O parotitis    right    Hard of hearing    History of kidney stones    History of retinal tear    Bilateral   History of staph infection    required wound vac   Hx-TIA (transient ischemic attack)    2015   Kidney stones 08/2020   LVH (left ventricular hypertrophy) 12/15/2016   Mild, noted on ECHO   MVP (mitral valve prolapse)    NAFL (nonalcoholic fatty liver)    Neck pain    Neuromuscular disorder (HCC)    bilateral neuropathy feet.   Nuclear sclerotic cataract of both eyes 09/08/2019   Pneumonia 12/17/2010   Polycythemia    Polycythemia, secondary    PONV (postoperative nausea and vomiting)    Protein C deficiency (HCC)    Dr. Anne Fu   Psoriasis    16 X10 cm psoriatic rash on sole of left foot ; open and occ scant bleeding;    psoriatic arthritis    PTSD (post-traumatic stress disorder)    Scaphoid fracture of wrist 09/23/2013   Seizure ([place])    childhood, medication until age 71 then weanned completely off   Sleep apnea    split night study last done by Dr. [name] 06/18/15 shows severe OSA, CSA, and hypersomnia, rec bipap   Splenomegaly    Stenosis of ureteropelvic junction (UPJ)    left   Stroke [facility])    CVA vs TIA in left cerebrum causing slight right sided weakness-Dr. 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  HENT: Negative.    Eyes: Negative.   Respiratory:  Positive for apnea and shortness of breath.   Cardiovascular:  Positive for leg swelling.  Gastrointestinal:  Positive for abdominal pain, constipation and diarrhea.  Endocrine: Negative.   Genitourinary:  Positive for frequency.  Musculoskeletal:  Positive for back pain, gait problem and neck pain.  Skin: Negative.   Allergic/Immunologic: Negative.   Neurological:  Positive for weakness, numbness and headaches.  Hematological: Negative.   Psychiatric/Behavioral:  The patient is nervous/anxious.       Objective:   Physical Exam .Gen: no distress, normal appearing HEENT: oral mucosa pink and moist, NCAT Cardio: Reg rate Chest: normal effort, normal rate of breathing Abd: soft, non-distended Ext: no edema  Psych: pleasant, normal affect Skin: intact Neuro: Alert and oriented x 3. Normal insight and awareness. Intact Memory. Normal language and speech. Cranial nerve exam unremarkable. LE motor 5/5 LLE and 4-5/ RLe with more weakness in ADF than anywhere else. He has stocking glove sensory loss in both feet below ankles as well as sensory loss along left lateral leg. Right 2-4 toes more affected? DTR's 1+ no resting tone Musculoskeletal:  full knee rom. No pain in le's. Did not test uppers today       Assessment & Plan:  1. Psoriatic arthritis with pain in multiple areas, most prominently feet, hands, elbows. His pain is also related to his prior CVA and associated  motor/sensory change   2. 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 3. Hx of left total knee revision again on 04/21/17 per [facility] and right TKA 08/2018 4. Chronic  low back pain---MRI with severe DDD at L5-S1. Left S1 radiculopathy? only mild foraminal stenosis however -s/p ESI 12/21/20  5. Protein C deficiency   6. Left shoulder subluxation, bicipital tendonitis 7. Central sleep apnea 8. Polycythemia 9. Depression with anxiety.  0. Left lateral epidondylitis  11. 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  now having right and left sided weakness with increased sensory loss in C4-6 dermatomes.. Most recent cervical MRI (07/08/17) without significant change 12. 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 [place] 14. Right wrist psoriatic arthritis, tendonapthy, ganglion cysts s/p multiple recent surgeries.  s/p right thumb replacement 15. Recent gender reconstruction surgery     Plan:  1. Continue venlafaxine per pyschiatry 2. MTX, etc per rheum 3. Voltaren gel to hands, feet, knees, elbows--- continue 4. Baclofen for lower extermity spasms/back pain. increase to 48m 5x daily, taking 224mtid and 4069mt bedtime 5. continue Ms Contin to 66m67m #60 and  oxycodone 10mg26m prn #70              -plan will ultimately to move his oxycodone to #60  We will continue the controlled substance monitoring program, this consists of regular clinic visits, examinations, routine drug screening, pill counts as well as use of [place]. [place] today.   6. MRI of cervical spine to assess C4-6 reviewed and without acute changes including area of syrinx         -continue PT -might do well with home traction unit -HEP  7. fair result with latest ESI        -consider f/u ESI at some point 8: OI constipation   -linzess  9. ?Qutenza candidate for ?peripheral neuropathy. 10. Will arrange NCS/EMG bilateral LE to assess increased sensory changes in right foot. He does have hx of thyroid nodule, cerfvical myelopathy and radiculopathy, and left CVA   15 minutes of teletime was spent during this visit. All questions were encouraged and answered.  Follow up with me in 2 mos . emg in 1 mos

## 2021-12-04 NOTE — Patient Instructions (Signed)
PLEASE FEEL FREE TO CALL OUR OFFICE WITH ANY PROBLEMS OR QUESTIONS ([phone])      

## 2021-12-05 ENCOUNTER — Ambulatory Visit: Payer: 59

## 2021-12-05 ENCOUNTER — Other Ambulatory Visit (HOSPITAL_COMMUNITY): Payer: Self-pay

## 2021-12-05 ENCOUNTER — Encounter: Payer: Self-pay | Admitting: Physical Medicine & Rehabilitation

## 2021-12-05 ENCOUNTER — Other Ambulatory Visit (HOSPITAL_BASED_OUTPATIENT_CLINIC_OR_DEPARTMENT_OTHER): Payer: Self-pay

## 2021-12-05 ENCOUNTER — Telehealth: Payer: Self-pay

## 2021-12-05 DIAGNOSIS — R293 Abnormal posture: Secondary | ICD-10-CM | POA: Diagnosis not present

## 2021-12-05 DIAGNOSIS — R252 Cramp and spasm: Secondary | ICD-10-CM

## 2021-12-05 DIAGNOSIS — M542 Cervicalgia: Secondary | ICD-10-CM

## 2021-12-05 DIAGNOSIS — M47816 Spondylosis without myelopathy or radiculopathy, lumbar region: Secondary | ICD-10-CM

## 2021-12-05 MED ORDER — HYDROXYZINE HCL 25 MG PO TABS
12.5000 mg | ORAL_TABLET | Freq: Three times a day (TID) | ORAL | 2 refills | Status: DC | PRN
  Filled 2021-12-20 – 2021-12-27 (×3): qty 90, 30d supply, fill #0

## 2021-12-05 MED ORDER — DIAZEPAM 5 MG PO TABS
5.0000 mg | ORAL_TABLET | Freq: Every evening | ORAL | 2 refills | Status: DC
  Filled 2021-12-05: qty 60, 30d supply, fill #0

## 2021-12-05 NOTE — Telephone Encounter (Signed)
The Morphine need to be released on 12/06/2021. Because he will be out of the medication on Saturday and the pharmacy is only open Mon-Friday. Will  please change the release note?

## 2021-12-05 NOTE — Therapy (Signed)
OUTPATIENT PHYSICAL THERAPY TREATMENT NOTE   Patient Name: Jesse Bowers MRN: [id number] DOB:[birth year], 56 y.o., adult Today's Date: 12/05/2021  PCP: NA-pt reports that he does not currently have a PCP REFERRING PROVIDER: Alger Simons, MD  END OF SESSION:   PT End of Session - 12/05/21 1130     Visit Number 8    Date for PT Re-Evaluation 12/25/21    Authorization Type UMR    PT Start Time 1110   late   PT Stop Time 1146    PT Time Calculation (min) 36 min    Activity Tolerance Patient tolerated treatment well    Behavior During Therapy WFL for tasks assessed/performed                   Past Medical History:  Diagnosis Date   Abnormal weight loss    Anxiety    Arthritis    Cataract    OU   Celiac disease    Cervical neck pain with evidence of disc disease    patient has a cyst    Chronic constipation    Chronic diastolic heart failure ([place])    Pt. 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   Family history of adverse reaction to anesthesia    family has problems with anesthesia of nausea and vomiting    GERD (gastroesophageal reflux disease)    History of in 20's   Gluten enteropathy    H/O parotitis    right    Hard of hearing    History of kidney stones    History of retinal tear    Bilateral   History of staph infection    required wound vac   Hx-TIA (transient ischemic attack)    2015   Kidney stones 08/2020   LVH (left ventricular hypertrophy) 12/15/2016   Mild, noted on ECHO   MVP (mitral valve prolapse)    NAFL (nonalcoholic fatty liver)    Neck pain    Neuromuscular disorder ([place])    bilateral neuropathy feet.   Nuclear sclerotic cataract of both eyes 09/08/2019   Pneumonia 12/17/2010   Polycythemia    Polycythemia,  secondary    PONV (postoperative nausea and vomiting)    Protein C deficiency (HCC)    Dr. Anne Fu   Psoriasis    16 X10 cm psoriatic rash on sole of left foot ; open and occ scant bleeding;    psoriatic arthritis    PTSD (post-traumatic stress disorder)    Scaphoid fracture of wrist 09/23/2013   Seizure ([place])    childhood, medication until age 22 then weanned completely off   Sleep apnea    split night study last done by Dr. [name] 06/18/15 shows severe OSA, CSA, and hypersomnia, rec bipap   Splenomegaly    Stenosis of ureteropelvic junction (UPJ)    left   Stroke [facility])    CVA vs TIA in left cerebrum causing slight right sided weakness-Dr. [name] follows   Syrinx of spinal cord ([place]) 01/06/2014   c spine on MRI   Tachycardia    hx of    Transfusion history    past history- none recent, after surgeries due to blood loss   Transgender  with history of gender affirmation surgery    Wears glasses    Wears hearing aid    Past Surgical History:  Procedure Laterality Date   ABDOMINAL HYSTERECTOMY Bilateral 1994   TAH, BSO- tranverse incision at 56 yo   ANKLE ARTHROSCOPY WITH RECONSTRUCTION Right 2007   CHOLECYSTECTOMY     laparoscopic   COLONOSCOPY     x3   EYE SURGERY     Left eye 03/02/2018, right 02/15/2018   HIP ARTHROSCOPY W/ LABRAL REPAIR Right 05/11/2013   acetabular labral tear 03/30/2013   KNEE ARTHROPLASTY Right    KNEE JOINT MANIPULATION Left    x3 under anesthesia   KNEE SURGERY Bilateral 1984   Right ACL, left PCL repair   LITHOTRIPSY  2005   LIVER BIOPSY  2013   normal results.   MASTECTOMY Bilateral    prior to 2009   MOUTH SURGERY     NASAL SEPTUM SURGERY N/A 09/20/2015   by ENT Dr. [name]   OVARIAN CYST SURGERY Left    size of grapefruit, was informed that she had shortened vagina   SHOULDER SURGERY Bilateral    Right 08/15/2016, Left 11/15/2016   THUMB ARTHROSCOPY Left    THYROIDECTOMY, PARTIAL Left 2008   TOTAL KNEE ARTHROPLASTY Right 08/23/2018    Procedure: TOTAL KNEE ARTHROPLASTY;  Surgeon: [name], MD;  Location: [facility];  Service: Orthopedics;  Laterality: Right;  40mn   TOTAL KNEE REVISION Left 02/06/2016   Procedure: LEFT TOTAL KNEE REVISION;  Surgeon: FGaynelle Arabian MD;  Location: [facility];  Service: Orthopedics;  Laterality: Left;   TOTAL KNEE REVISION Left 04/22/2017   Procedure: Left knee polyethylene revision;  Surgeon: AGaynelle Arabian MD;  Location: [facility];  Service: Orthopedics;  Laterality: Left;   UPPER GI ENDOSCOPY  2003   Patient Active Problem List   Diagnosis Date Noted   Posterior vitreous detachment of right eye 11/18/2021   Spondylosis of lumbar spine 10/03/2020   Pseudophakia of both eyes 05/07/2020   Retinal telangiectasis of both eyes 05/07/2020   Lattice degeneration of peripheral retina, left 09/08/2019   Lattice degeneration, right eye 09/08/2019   Chronic diastolic heart failure ([place] 05/24/2019   Urinary dysfunction 04/12/2019   Osteoarthritis of right knee 08/23/2018   Constipation due to opioid therapy 03/30/2018   SNHL (sensorineural hearing loss) 12/04/2017   Abnormal urinary stream 12/03/2017   Osteoarthritis of carpometacarpal (CMC) joint of thumb 11/30/2017   Muscle weakness 11/17/2017   Transient vision disturbance 11/12/2017   Bilateral hand pain 10/30/2017   Pain of left hip joint 10/09/2017   Gynecomastia 07/10/2017   Iron deficiency anemia 07/05/2017   Spasticity 05/20/2017   Lumbar radiculitis 04/20/2017   Ulnar 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, left 11/28/2016   Idiopathic peripheral neuropathy 11/28/2016   Failed total knee arthroplasty, 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   Long term (current) use of anticoagulants 08/23/2015   Right upper quadrant abdominal pain 08/23/2015   Memory loss 05/10/2015   Gait abnormality 04/07/2015   Alkaline phosphatase elevation 04/07/2015   History of thrombosis 03/26/2015   Medial epicondylitis 02/07/2015   Cognitive decline 12/21/2014   Leukopenia 12/05/2014    Rotator cuff syndrome of right shoulder 10/27/2014   Status post left knee replacement 08/22/2014   Left lateral epicondylitis 08/22/2014   Chronic cerebral ischemia 08/18/2014   Arthrofibrosis of knee joint 08/17/2014   Cubital canal compression syndrome, left 08/17/2014   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   Chronic pain syndrome 04/10/2014   Insomnia 04/10/2014   Chronic non-specific  white matter lesions on MRI 04/10/2014   CFS (chronic fatigue syndrome) 04/10/2014   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[place]) 03/01/2014   Biceps tendonitis on left 03/01/2014   Polycythemia vera ([place]) 12/27/2013   H/O TIA (transient ischemic attack) and stroke 12/27/2013   Neck pain 12/27/2013   OSA (obstructive sleep apnea) 12/08/2013   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/31/2013   Depression with anxiety 08/04/2013   Protein C deficiency ([place]) 08/01/2013   Post traumatic stress disorder (PTSD) 08/01/2013   Speech abnormality 07/25/2013   Obesity 05/10/2013   Lower extremity edema 05/10/2013   GERD (gastroesophageal reflux disease) 05/10/2013   Arthritis 05/10/2013   OA (osteoarthritis) of knee 03/15/2013   Headache 10/25/2012   Palpitations 10/18/2012   Fatty liver determined by biopsy 06/01/2012   Arthropathic psoriasis, unspecified ([place]) 04/29/2012   Abnormal liver enzymes 03/29/2012   Psoriatic arthritis ([place]) 12/29/2011   Left Renal Hydronephrosis 12/11/2010   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     THERAPY DIAG:  Abnormal posture  Cervicalgia  Cramp and spasm  Rationale for Evaluation and Treatment Rehabilitation  PERTINENT HISTORY: Lumbar stenosis L5-S1, failed TKA 2019, neuropathy in bil feet, PTSD, CVA vs TIA Rt weakness,  head trauma 2019, Rt thumb replacement, bil total knee replacements, bil shoulder scope surgeries  PRECAUTIONS: Fall  SUBJECTIVE: My neck ROM is better and I feel 20% better. I saw Dr [name] yesterday and he was glad that I'm doing traction.    PAIN:  PAIN:  Are you having pain? Yes NPRS scale: 5/10 Pain location: neck Lt>Rt PAIN TYPE: aching Pain description: intermittent and aching  Aggravating factors: nothing specific, at the end of the day from fatigue Relieving factors: traction, rest   OBJECTIVE: (objective measures completed at initial evaluation unless otherwise dated)  DIAGNOSTIC FINDINGS:  MRI on 10/24/21: No interval change of the syrinx (fluid filled cyst) spanning the C4 through C6 levels measuring up to 4 mm in diameter. 2. Mild degenerative changes of the cervical spine without high-grade spinal canal stenosis at any level. 3. Mild to moderate left neural foraminal narrowing at C3-4, unchanged.   PATIENT SURVEYS:  FOTO 23 (52 is goal)   COGNITION: Overall cognitive status: Within functional limits for tasks assessed SENSATION: WFL   POSTURE: rounded shoulders and forward head   PALPATION: Trigger points in bil suboccipitals, upper traps and tension in bil cervical paraspinals.  Pt with reduced segmental mobility C3-7 and T1-5.  Pain reduction with manual cervical traction in supine.              CERVICAL ROM:    Active ROM A/PROM (deg) eval A/ROM 11/11/21  Flexion 50   Extension 30   Right lateral flexion 35   Left lateral flexion 40   Right rotation 60 65  Left rotation 70 80   (Blank rows = not tested)   UPPER EXTREMITY ROM:   Bil UE A/ROM limited by 25% in all directions.    UPPER EXTREMITY MMT:   MMT Right eval Left eval  Shoulder flexion 4-/5 4/5  Shoulder extension 4- 4  Shoulder abduction 4- 4-  Shoulder adduction      Shoulder extension      Shoulder internal rotation 4+ 4+  Shoulder external rotation 4- 4-  Middle trapezius  Lower trapezius      Elbow flexion      Elbow extension      Wrist flexion      Wrist extension      Wrist ulnar deviation      Wrist radial deviation      Wrist pronation      Wrist supination      Grip strength 32 29   (Blank rows = not tested)   TODAY'S TREATMENT:  12/05/21: Arm bike L1.3  min forward/3 min reverse- PTA present to monitor and discuss progress 2# 3 way raise 10x Bicep Curls 5#  3x10x Seated green band: horizontal abduction, D2 and ER 2x10 bil each Cervical traction: 17# intermittent 60 second hold, 10 second rest x10  12/02/21: Arm bike L1.3  min forward/3 min reverse- PTA present to monitor and discuss progress 2# 3 way raise 10x Bicep Curls 4#  3x10x Cervical traction: 17# intermittent 60 second hold, 10 second rest x15  11/25/21; Arm bike 3 min forward/3 min reverse- PTA present to monitor and discuss progress 2# 3 way raise 10x Bicep Curls 3# 2# 10x Seated: green band horizontal abduction, ER and added D2  Cervical traction: 16# intermittent 60 second hold, 10 second rest x15    PATIENT EDUCATION:  Education details: Access Code: [email or web address] Person educated: Patient and Parent Education method: Explanation, Demonstration, and Handouts Education comprehension: verbalized understanding and returned demonstration     HOME EXERCISE PROGRAM: Access Code: [email or web address] URL: [email or web address] Date: 10/30/2021 Prepared by: Claiborne Billings   Exercises - Seated Cervical Flexion AROM  - 3 x daily - 7 x weekly - 1 sets - 3 reps - 20 hold - Seated Cervical Sidebending AROM  - 3 x daily - 7 x weekly - 1 sets - 3 reps - 20 hold - Seated Cervical Rotation AROM  - 3 x daily - 7 x weekly - 1 sets - 3 reps - 20 hold - Seated Correct Posture  - 1 x daily - 7 x weekly - 3 sets - 10 reps - Seated Scapular Retraction  - 5 x daily - 7 x weekly - 1 sets - 10 reps - 5 hold   ASSESSMENT:   CLINICAL IMPRESSION:   Pt reports pain is down 20%, postural awareness is  better with fewer verbal cues required throughout session.  Pt does require tactile cues to reduce scapular elevation with seated band exercises.  Pt tolerated increased weight with biceps curls and D2 with band.  Traction was at 17# today with no adverse effects. We will increase this next week.  Patient will benefit from skilled PT to address the below impairments and improve overall function.    OBJECTIVE IMPAIRMENTS decreased mobility, decreased ROM, decreased strength, hypomobility, increased muscle spasms, impaired flexibility, impaired UE functional use, postural dysfunction, and pain.    ACTIVITY LIMITATIONS lifting, sitting, and reach over head   PARTICIPATION LIMITATIONS: cleaning, laundry, and driving   PERSONAL FACTORS 3+ comorbidities: cervical DDD, CVA, bil LE neuropathy,   are also affecting patient's functional outcome.    REHAB POTENTIAL: Good   CLINICAL DECISION MAKING: Evolving/moderate complexity   EVALUATION COMPLEXITY: Moderate   GOALS: Goals reviewed with patient? Yes   SHORT TERM GOALS: Target date: 11/27/2021    Be independent in initial HEP Baseline:  Goal status: Goal met 11/18/21   2.  Verbalize postural modifications to improve alignment and reduce UE symptoms  Baseline:  Goal status: Goal met 11/18/21   3.  Demonstrate Rt cervical  A/ROM rotation to > or = to 70 degrees to improve safety with driving Baseline: 65 Rt, 70 Lt (11/14/21) Goal status: In progress    4.  Report > or = to 30% reduction in neck pain and UE radiculopathy with ADLs and self-care Baseline: 20%  Goal status: 20% 12/05/21   LONG TERM GOALS: Target date: 12/25/2021   Be independent in advanced HEP Baseline:  Goal status: INITIAL   2.  Improve FOTO to > or = to 52  Baseline: 36 Goal status: INITIAL   3.  Improve bil grip to > or = to 45# to improve functional use and endurance with ADLs and self-care Baseline: 32/29 Goal status: INITIAL   4.  Report > or = to 70% reduction  in neck pain and UE pain with ADLs and self-care  Baseline: 20% (12/05/21) Goal status: INITIAL   5.  Demonstrate > or = to 80 degrees A/ROM rotation bilaterally to improve safety with driving  Baseline: 57/90 Goal status: INITIAL       PLAN: PT FREQUENCY: 1-2x/week   PT DURATION: 8 weeks   PLANNED INTERVENTIONS: Therapeutic exercises, Therapeutic activity, Neuromuscular re-education, Balance training, Gait training, Patient/Family education, Self Care, Joint mobilization, Joint manipulation, Aquatic Therapy, Dry Needling, Electrical stimulation, Spinal manipulation, Spinal mobilization, Cryotherapy, Moist heat, Taping, Traction, Ultrasound, Manual therapy, and Re-evaluation   PLAN FOR NEXT SESSION: continue arm bike, assess cervical traction and increase pull if indicated, progress postural strength    Sigurd Sos, PT 12/05/21 11:41 AM

## 2021-12-06 ENCOUNTER — Other Ambulatory Visit (HOSPITAL_BASED_OUTPATIENT_CLINIC_OR_DEPARTMENT_OTHER): Payer: Self-pay

## 2021-12-06 NOTE — Telephone Encounter (Signed)
Pharmacy will fill today. I spoke with them.

## 2021-12-06 NOTE — Telephone Encounter (Signed)
I called the pharmacy and gave them the okay to release the medication today 12/06/2021. Since they will be closed tomorrow. Per Pharmacy it will be released today. A generic voicemail message  update has been  left on Jesse Bowers's voicemail.

## 2021-12-06 NOTE — Addendum Note (Signed)
Addended by: Alger Simons T on: 12/06/2021 11:38 AM   Modules accepted: Orders

## 2021-12-08 ENCOUNTER — Encounter: Payer: Self-pay | Admitting: Cardiovascular Disease

## 2021-12-08 DIAGNOSIS — R0602 Shortness of breath: Secondary | ICD-10-CM

## 2021-12-08 NOTE — Telephone Encounter (Signed)
Please order echo for dyspnea

## 2021-12-09 ENCOUNTER — Ambulatory Visit: Payer: 59 | Admitting: Physical Therapy

## 2021-12-09 ENCOUNTER — Encounter: Payer: Self-pay | Admitting: Physical Therapy

## 2021-12-09 DIAGNOSIS — M542 Cervicalgia: Secondary | ICD-10-CM

## 2021-12-09 DIAGNOSIS — R252 Cramp and spasm: Secondary | ICD-10-CM

## 2021-12-09 DIAGNOSIS — R293 Abnormal posture: Secondary | ICD-10-CM

## 2021-12-09 NOTE — Therapy (Signed)
OUTPATIENT PHYSICAL THERAPY TREATMENT NOTE   Patient Name: Jesse Bowers MRN: [id number] DOB:December 07, 1965, 56 y.o., adult Today's Date: 12/09/2021  PCP: NA-pt reports that he does not currently have a PCP REFERRING PROVIDER: Alger Simons, MD  END OF SESSION:   PT End of Session - 12/09/21 1247     Visit Number 9    Date for PT Re-Evaluation 12/25/21    Authorization Type UMR    PT Start Time 1246   pt late: had flat tire   PT Stop Time 1316    PT Time Calculation (min) 30 min    Activity Tolerance Patient tolerated treatment well    Behavior During Therapy WFL for tasks assessed/performed                    Past Medical History:  Diagnosis Date   Abnormal weight loss    Anxiety    Arthritis    Cataract    OU   Celiac disease    Cervical neck pain with evidence of disc disease    patient has a cyst    Chronic constipation    Chronic diastolic heart failure ([place])    Pt. 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   Family history of adverse reaction to anesthesia    family has problems with anesthesia of nausea and vomiting    GERD (gastroesophageal reflux disease)    History of in 20's   Gluten enteropathy    H/O parotitis    right    Hard of hearing    History of kidney stones    History of retinal tear    Bilateral   History of staph infection    required wound vac   Hx-TIA (transient ischemic attack)    2015   Kidney stones 08/2020   LVH (left ventricular hypertrophy) 12/15/2016   Mild, noted on ECHO   MVP (mitral valve prolapse)    NAFL (nonalcoholic fatty liver)    Neck pain    Neuromuscular disorder ([place])    bilateral neuropathy feet.   Nuclear sclerotic cataract of both eyes 09/08/2019   Pneumonia 12/17/2010    Polycythemia    Polycythemia, secondary    PONV (postoperative nausea and vomiting)    Protein C deficiency (HCC)    Dr. Anne Fu   Psoriasis    16 X10 cm psoriatic rash on sole of left foot ; open and occ scant bleeding;    psoriatic arthritis    PTSD (post-traumatic stress disorder)    Scaphoid fracture of wrist 09/23/2013   Seizure ([place])    childhood, medication until age 68 then weanned completely off   Sleep apnea    split night study last done by Dr. [name] 06/18/15 shows severe OSA, CSA, and hypersomnia, rec bipap   Splenomegaly    Stenosis of ureteropelvic junction (UPJ)    left   Stroke [facility])    CVA vs TIA in left cerebrum causing slight right sided weakness-Dr. [name] follows   Syrinx of spinal cord ([place]) 01/06/2014   c spine on MRI   Tachycardia    hx of    Transfusion history    past history- none recent, after surgeries due to  blood loss   Transgender with history of gender affirmation surgery    Wears glasses    Wears hearing aid    Past Surgical History:  Procedure Laterality Date   ABDOMINAL HYSTERECTOMY Bilateral 1994   TAH, BSO- tranverse incision at 56 yo   ANKLE ARTHROSCOPY WITH RECONSTRUCTION Right 2007   CHOLECYSTECTOMY     laparoscopic   COLONOSCOPY     x3   EYE SURGERY     Left eye 03/02/2018, right 02/15/2018   HIP ARTHROSCOPY W/ LABRAL REPAIR Right 05/11/2013   acetabular labral tear 03/30/2013   KNEE ARTHROPLASTY Right    KNEE JOINT MANIPULATION Left    x3 under anesthesia   KNEE SURGERY Bilateral 1984   Right ACL, left PCL repair   LITHOTRIPSY  2005   LIVER BIOPSY  2013   normal results.   MASTECTOMY Bilateral    prior to 2009   MOUTH SURGERY     NASAL SEPTUM SURGERY N/A 09/20/2015   by ENT Dr. [name]   OVARIAN CYST SURGERY Left    size of grapefruit, was informed that she had shortened vagina   SHOULDER SURGERY Bilateral    Right 08/15/2016, Left 11/15/2016   THUMB ARTHROSCOPY Left    THYROIDECTOMY, PARTIAL Left 2008   TOTAL KNEE  ARTHROPLASTY Right 08/23/2018   Procedure: TOTAL KNEE ARTHROPLASTY;  Surgeon: [name], MD;  Location: [facility];  Service: Orthopedics;  Laterality: Right;  18mn   TOTAL KNEE REVISION Left 02/06/2016   Procedure: LEFT TOTAL KNEE REVISION;  Surgeon: FGaynelle Arabian MD;  Location: [facility];  Service: Orthopedics;  Laterality: Left;   TOTAL KNEE REVISION Left 04/22/2017   Procedure: Left knee polyethylene revision;  Surgeon: AGaynelle Arabian MD;  Location: [facility];  Service: Orthopedics;  Laterality: Left;   UPPER GI ENDOSCOPY  2003   Patient Active Problem List   Diagnosis Date Noted   Posterior vitreous detachment of right eye 11/18/2021   Spondylosis of lumbar spine 10/03/2020   Pseudophakia of both eyes 05/07/2020   Retinal telangiectasis of both eyes 05/07/2020   Lattice degeneration of peripheral retina, left 09/08/2019   Lattice degeneration, right eye 09/08/2019   Chronic diastolic heart failure ([place] 05/24/2019   Urinary dysfunction 04/12/2019   Osteoarthritis of right knee 08/23/2018   Constipation due to opioid therapy 03/30/2018   SNHL (sensorineural hearing loss) 12/04/2017   Abnormal urinary stream 12/03/2017   Osteoarthritis of carpometacarpal (CMC) joint of thumb 11/30/2017   Muscle weakness 11/17/2017   Transient vision disturbance 11/12/2017   Bilateral hand pain 10/30/2017   Pain of left hip joint 10/09/2017   Gynecomastia 07/10/2017   Iron deficiency anemia 07/05/2017   Spasticity 05/20/2017   Lumbar radiculitis 04/20/2017   Ulnar 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, left 11/28/2016   Idiopathic peripheral neuropathy 11/28/2016   Failed total knee arthroplasty, 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   Long term (current) use of anticoagulants 08/23/2015   Right upper quadrant abdominal pain 08/23/2015   Memory loss 05/10/2015   Gait abnormality 04/07/2015   Alkaline phosphatase elevation 04/07/2015   History of thrombosis 03/26/2015   Medial epicondylitis 02/07/2015   Cognitive decline 12/21/2014    Leukopenia 12/05/2014   Rotator cuff syndrome of right shoulder 10/27/2014   Status post left knee replacement 08/22/2014   Left lateral epicondylitis 08/22/2014   Chronic cerebral ischemia 08/18/2014   Arthrofibrosis of knee joint 08/17/2014   Cubital canal compression syndrome, left 08/17/2014   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   Chronic pain syndrome 04/10/2014   Insomnia  04/10/2014   Chronic non-specific white matter lesions on MRI 04/10/2014   CFS (chronic fatigue syndrome) 04/10/2014   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[place]) 03/01/2014   Biceps tendonitis on left 03/01/2014   Polycythemia vera ([place]) 12/27/2013   H/O TIA (transient ischemic attack) and stroke 12/27/2013   Neck pain 12/27/2013   OSA (obstructive sleep apnea) 12/08/2013   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/31/2013   Depression with anxiety 08/04/2013   Protein C deficiency ([place]) 08/01/2013   Post traumatic stress disorder (PTSD) 08/01/2013   Speech abnormality 07/25/2013   Obesity 05/10/2013   Lower extremity edema 05/10/2013   GERD (gastroesophageal reflux disease) 05/10/2013   Arthritis 05/10/2013   OA (osteoarthritis) of knee 03/15/2013   Headache 10/25/2012   Palpitations 10/18/2012   Fatty liver determined by biopsy 06/01/2012   Arthropathic psoriasis, unspecified ([place]) 04/29/2012   Abnormal liver enzymes 03/29/2012   Psoriatic arthritis ([place]) 12/29/2011   Left Renal Hydronephrosis 12/11/2010   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     THERAPY DIAG:  Abnormal posture  Cervicalgia  Cramp and spasm  Rationale for Evaluation and Treatment Rehabilitation  PERTINENT HISTORY: Lumbar stenosis L5-S1, failed TKA 2019, neuropathy in bil feet,  PTSD, CVA vs TIA Rt weakness, head trauma 2019, Rt thumb replacement, bil total knee replacements, bil shoulder scope surgeries  PRECAUTIONS: Fall  SUBJECTIVE: 5# was too much last time but 4# is ok. Sorry I was late I had a flat tire.   PAIN:  Are you having pain? Neck is just sore not not pain/ NPRS scale: 5/10 Pain location: neck Lt>Rt PAIN TYPE: aching Pain description: intermittent and aching  Aggravating factors: nothing specific, at the end of the day from fatigue Relieving factors: traction, rest   OBJECTIVE: (objective measures completed at initial evaluation unless otherwise dated)  DIAGNOSTIC FINDINGS:  MRI on 10/24/21: No interval change of the syrinx (fluid filled cyst) spanning the C4 through C6 levels measuring up to 4 mm in diameter. 2. Mild degenerative changes of the cervical spine without high-grade spinal canal stenosis at any level. 3. Mild to moderate left neural foraminal narrowing at C3-4, unchanged.   PATIENT SURVEYS:  FOTO 8 (52 is goal)   COGNITION: Overall cognitive status: Within functional limits for tasks assessed SENSATION: WFL   POSTURE: rounded shoulders and forward head   PALPATION: Trigger points in bil suboccipitals, upper traps and tension in bil cervical paraspinals.  Pt with reduced segmental mobility C3-7 and T1-5.  Pain reduction with manual cervical traction in supine.              CERVICAL ROM:    Active ROM A/PROM (deg) eval A/ROM 11/11/21  Flexion 50   Extension 30   Right lateral flexion 35   Left lateral flexion 40   Right rotation 60 65  Left rotation 70 80   (Blank rows = not tested)   UPPER EXTREMITY ROM:   Bil UE A/ROM limited by 25% in all directions.    UPPER EXTREMITY MMT:   MMT Right eval Left eval  Shoulder flexion 4-/5 4/5  Shoulder extension 4- 4  Shoulder abduction 4- 4-  Shoulder adduction      Shoulder extension      Shoulder internal rotation 4+ 4+  Shoulder external rotation 4- 4-  Middle  trapezius      Lower trapezius      Elbow flexion      Elbow extension      Wrist flexion      Wrist extension      Wrist ulnar deviation      Wrist radial deviation      Wrist pronation      Wrist supination      Grip strength 32 29   (Blank rows = not tested)   TODAY'S TREATMENT:   12/09/21: Arm bike L1.3  min forward/3 min reverse- PTA present to monitor and discuss progress Bicep Curls 4# 2x10 2# 3 was raise 12x Cervical mechanical Traction 18# 10 min 60/20    12/05/21: Arm bike L1.3  min forward/3 min reverse- PTA present to monitor and discuss progress 2# 3 way raise 10x Bicep Curls 5#  3x10x Seated green band: horizontal abduction, D2 and ER 2x10 bil each Cervical traction: 17# intermittent 60 second hold, 10 second rest x10  12/02/21: Arm bike L1.3  min forward/3 min reverse- PTA present to monitor and discuss progress 2# 3 way raise 10x Bicep Curls 4#  3x10x Cervical traction: 17# intermittent 60 second hold, 10 second rest x15    PATIENT EDUCATION:  Education details: Access Code: [email or web address] Person educated: Patient and Parent Education method: Explanation, Demonstration, and Handouts Education comprehension: verbalized understanding and returned demonstration     HOME EXERCISE PROGRAM: Access Code: [email or web address] URL: [email or web address] Date: 10/30/2021 Prepared by: Claiborne Billings   Exercises - Seated Cervical Flexion AROM  - 3 x daily - 7 x weekly - 1 sets - 3 reps - 20 hold - Seated Cervical Sidebending AROM  - 3 x daily - 7 x weekly - 1 sets - 3 reps - 20 hold - Seated Cervical Rotation AROM  - 3 x daily - 7 x weekly - 1 sets - 3 reps - 20 hold - Seated Correct Posture  - 1 x daily - 7 x weekly - 3 sets - 10 reps - Seated Scapular Retraction  - 5 x daily - 7 x weekly - 1 sets - 10 reps - 5 hold   ASSESSMENT:   CLINICAL IMPRESSION:  Pt was 15 min late due to having a flat tire. Pt arrives stating he had no pain  obut is feeling sore. We reduced  resistance for bicep curls and maintained deltoid resistance.  Traction was pulled/increased with 18# of pull today. Pt awaiting an order for home traction unit from MD.  OBJECTIVE IMPAIRMENTS decreased mobility, decreased ROM, decreased strength, hypomobility, increased muscle spasms, impaired flexibility, impaired UE functional use, postural dysfunction, and pain.    ACTIVITY LIMITATIONS lifting, sitting, and reach over head   PARTICIPATION LIMITATIONS: cleaning, laundry, and driving   PERSONAL FACTORS 3+ comorbidities: cervical DDD, CVA, bil LE neuropathy,   are also affecting patient's functional outcome.    REHAB POTENTIAL: Good   CLINICAL DECISION MAKING: Evolving/moderate complexity   EVALUATION COMPLEXITY: Moderate   GOALS: Goals reviewed with patient? Yes   SHORT TERM GOALS: Target date: 11/27/2021    Be independent in initial HEP Baseline:  Goal status: Goal met 11/18/21   2.  Verbalize postural modifications to improve alignment and reduce UE symptoms  Baseline:  Goal status: Goal met 11/18/21   3.  Demonstrate Rt cervical A/ROM rotation to > or = to 70 degrees to improve safety with driving Baseline: 65 Rt, 70 Lt (11/14/21) Goal status: In progress    4.  Report > or = to 30% reduction in neck pain and UE radiculopathy with ADLs and self-care Baseline: 20%  Goal status: 20% 12/05/21   LONG TERM GOALS: Target date: 12/25/2021   Be independent in advanced HEP Baseline:  Goal status: INITIAL   2.  Improve FOTO to > or = to 52  Baseline: 36 Goal status: INITIAL   3.  Improve bil grip to > or = to 45# to improve functional use and endurance with ADLs and self-care Baseline: 32/29 Goal status: INITIAL   4.  Report > or = to 70% reduction in neck pain and UE pain with ADLs and self-care  Baseline: 20% (12/05/21) Goal status: INITIAL   5.  Demonstrate > or = to 80 degrees A/ROM rotation bilaterally to improve safety with driving  Baseline: 24/17 Goal status:  INITIAL       PLAN: PT FREQUENCY: 1-2x/week   PT DURATION: 8 weeks   PLANNED INTERVENTIONS: Therapeutic exercises, Therapeutic activity, Neuromuscular re-education, Balance training, Gait training, Patient/Family education, Self Care, Joint mobilization, Joint manipulation, Aquatic Therapy, Dry Needling, Electrical stimulation, Spinal manipulation, Spinal mobilization, Cryotherapy, Moist heat, Taping, Traction, Ultrasound, Manual therapy, and Re-evaluation   PLAN FOR NEXT SESSION: continue arm bike, assess cervical traction and increase pull if indicated, progress postural strength   Myrene Galas, PTA 12/09/21 1:06 PM

## 2021-12-10 ENCOUNTER — Ambulatory Visit (HOSPITAL_COMMUNITY): Payer: 59 | Attending: Cardiovascular Disease

## 2021-12-10 DIAGNOSIS — R0602 Shortness of breath: Secondary | ICD-10-CM

## 2021-12-10 LAB — ECHOCARDIOGRAM COMPLETE
Area-P 1/2: 4.15 cm2
S' Lateral: 2.8 cm

## 2021-12-10 NOTE — Telephone Encounter (Signed)
Methotrexate would be an extremely uncommon cause of cardiac issues.  It is more likely to cause lung or liver problems in addition to suppressing the immune system.

## 2021-12-12 ENCOUNTER — Ambulatory Visit: Payer: 59

## 2021-12-12 DIAGNOSIS — R252 Cramp and spasm: Secondary | ICD-10-CM

## 2021-12-12 DIAGNOSIS — M542 Cervicalgia: Secondary | ICD-10-CM

## 2021-12-12 DIAGNOSIS — R293 Abnormal posture: Secondary | ICD-10-CM

## 2021-12-12 NOTE — Therapy (Addendum)
OUTPATIENT PHYSICAL THERAPY TREATMENT NOTE   Patient Name: Jesse Bowers MRN: [id number] DOB:May 04, 1965, 56 y.o., adult Today's Date: 12/12/2021  PCP: NA-pt reports that he does not currently have a PCP REFERRING PROVIDER: Alger Simons, MD  END OF SESSION:   PT End of Session - 12/12/21 1308     Visit Number 10    Date for PT Re-Evaluation 02/06/22    Authorization Type UMR    PT Start Time 1241    PT Stop Time 9741    PT Time Calculation (min) 32 min    Activity Tolerance Patient tolerated treatment well    Behavior During Therapy WFL for tasks assessed/performed                     Past Medical History:  Diagnosis Date   Abnormal weight loss    Anxiety    Arthritis    Cataract    OU   Celiac disease    Cervical neck pain with evidence of disc disease    patient has a cyst    Chronic constipation    Chronic diastolic heart failure ([place])    Pt. 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   Family history of adverse reaction to anesthesia    family has problems with anesthesia of nausea and vomiting    GERD (gastroesophageal reflux disease)    History of in 20's   Gluten enteropathy    H/O parotitis    right    Hard of hearing    History of kidney stones    History of retinal tear    Bilateral   History of staph infection    required wound vac   Hx-TIA (transient ischemic attack)    2015   Kidney stones 08/2020   LVH (left ventricular hypertrophy) 12/15/2016   Mild, noted on ECHO   MVP (mitral valve prolapse)    NAFL (nonalcoholic fatty liver)    Neck pain    Neuromuscular disorder ([place])    bilateral neuropathy feet.   Nuclear sclerotic cataract of both eyes 09/08/2019   Pneumonia 12/17/2010   Polycythemia    Polycythemia,  secondary    PONV (postoperative nausea and vomiting)    Protein C deficiency (HCC)    Dr. Anne Fu   Psoriasis    16 X10 cm psoriatic rash on sole of left foot ; open and occ scant bleeding;    psoriatic arthritis    PTSD (post-traumatic stress disorder)    Scaphoid fracture of wrist 09/23/2013   Seizure ([place])    childhood, medication until age 9 then weanned completely off   Sleep apnea    split night study last done by Dr. [name] 06/18/15 shows severe OSA, CSA, and hypersomnia, rec bipap   Splenomegaly    Stenosis of ureteropelvic junction (UPJ)    left   Stroke [facility])    CVA vs TIA in left cerebrum causing slight right sided weakness-Dr. [name] follows   Syrinx of spinal cord ([place]) 01/06/2014   c spine on MRI   Tachycardia    hx of    Transfusion history    past history- none recent, after surgeries due to blood loss   Transgender  with history of gender affirmation surgery    Wears glasses    Wears hearing aid    Past Surgical History:  Procedure Laterality Date   ABDOMINAL HYSTERECTOMY Bilateral 1994   TAH, BSO- tranverse incision at 56 yo   ANKLE ARTHROSCOPY WITH RECONSTRUCTION Right 2007   CHOLECYSTECTOMY     laparoscopic   COLONOSCOPY     x3   EYE SURGERY     Left eye 03/02/2018, right 02/15/2018   HIP ARTHROSCOPY W/ LABRAL REPAIR Right 05/11/2013   acetabular labral tear 03/30/2013   KNEE ARTHROPLASTY Right    KNEE JOINT MANIPULATION Left    x3 under anesthesia   KNEE SURGERY Bilateral 1984   Right ACL, left PCL repair   LITHOTRIPSY  2005   LIVER BIOPSY  2013   normal results.   MASTECTOMY Bilateral    prior to 2009   MOUTH SURGERY     NASAL SEPTUM SURGERY N/A 09/20/2015   by ENT Dr. [name]   OVARIAN CYST SURGERY Left    size of grapefruit, was informed that she had shortened vagina   SHOULDER SURGERY Bilateral    Right 08/15/2016, Left 11/15/2016   THUMB ARTHROSCOPY Left    THYROIDECTOMY, PARTIAL Left 2008   TOTAL KNEE ARTHROPLASTY Right 08/23/2018    Procedure: TOTAL KNEE ARTHROPLASTY;  Surgeon: [name], MD;  Location: [facility];  Service: Orthopedics;  Laterality: Right;  30mn   TOTAL KNEE REVISION Left 02/06/2016   Procedure: LEFT TOTAL KNEE REVISION;  Surgeon: FGaynelle Arabian MD;  Location: [facility];  Service: Orthopedics;  Laterality: Left;   TOTAL KNEE REVISION Left 04/22/2017   Procedure: Left knee polyethylene revision;  Surgeon: AGaynelle Arabian MD;  Location: [facility];  Service: Orthopedics;  Laterality: Left;   UPPER GI ENDOSCOPY  2003   Patient Active Problem List   Diagnosis Date Noted   Posterior vitreous detachment of right eye 11/18/2021   Spondylosis of lumbar spine 10/03/2020   Pseudophakia of both eyes 05/07/2020   Retinal telangiectasis of both eyes 05/07/2020   Lattice degeneration of peripheral retina, left 09/08/2019   Lattice degeneration, right eye 09/08/2019   Chronic diastolic heart failure ([place] 05/24/2019   Urinary dysfunction 04/12/2019   Osteoarthritis of right knee 08/23/2018   Constipation due to opioid therapy 03/30/2018   SNHL (sensorineural hearing loss) 12/04/2017   Abnormal urinary stream 12/03/2017   Osteoarthritis of carpometacarpal (CMC) joint of thumb 11/30/2017   Muscle weakness 11/17/2017   Transient vision disturbance 11/12/2017   Bilateral hand pain 10/30/2017   Pain of left hip joint 10/09/2017   Gynecomastia 07/10/2017   Iron deficiency anemia 07/05/2017   Spasticity 05/20/2017   Lumbar radiculitis 04/20/2017   Ulnar 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, left 11/28/2016   Idiopathic peripheral neuropathy 11/28/2016   Failed total knee arthroplasty, 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   Long term (current) use of anticoagulants 08/23/2015   Right upper quadrant abdominal pain 08/23/2015   Memory loss 05/10/2015   Gait abnormality 04/07/2015   Alkaline phosphatase elevation 04/07/2015   History of thrombosis 03/26/2015   Medial epicondylitis 02/07/2015   Cognitive decline 12/21/2014   Leukopenia 12/05/2014    Rotator cuff syndrome of right shoulder 10/27/2014   Status post left knee replacement 08/22/2014   Left lateral epicondylitis 08/22/2014   Chronic cerebral ischemia 08/18/2014   Arthrofibrosis of knee joint 08/17/2014   Cubital canal compression syndrome, left 08/17/2014   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[place] 04/10/2014   Chronic pain syndrome 04/10/2014   Insomnia 04/10/2014   Chronic non-specific  white matter lesions on MRI 04/10/2014   CFS (chronic fatigue syndrome) 04/10/2014   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[place]) 03/01/2014   Biceps tendonitis on left 03/01/2014   Polycythemia vera ([place]) 12/27/2013   H/O TIA (transient ischemic attack) and stroke 12/27/2013   Neck pain 12/27/2013   OSA (obstructive sleep apnea) 12/08/2013   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/31/2013   Depression with anxiety 08/04/2013   Protein C deficiency ([place]) 08/01/2013   Post traumatic stress disorder (PTSD) 08/01/2013   Speech abnormality 07/25/2013   Obesity 05/10/2013   Lower extremity edema 05/10/2013   GERD (gastroesophageal reflux disease) 05/10/2013   Arthritis 05/10/2013   OA (osteoarthritis) of knee 03/15/2013   Headache 10/25/2012   Palpitations 10/18/2012   Fatty liver determined by biopsy 06/01/2012   Arthropathic psoriasis, unspecified ([place]) 04/29/2012   Abnormal liver enzymes 03/29/2012   Psoriatic arthritis ([place]) 12/29/2011   Left Renal Hydronephrosis 12/11/2010   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     THERAPY DIAG:  Abnormal posture - Plan: PT plan of care cert/re-cert  Cervicalgia - Plan: PT plan of care cert/re-cert  Cramp and spasm - Plan: PT plan of care cert/re-cert  Rationale for Evaluation and Treatment Rehabilitation  PERTINENT HISTORY: Lumbar stenosis L5-S1, failed  TKA 2019, neuropathy in bil feet, PTSD, CVA vs TIA Rt weakness, head trauma 2019, Rt thumb replacement, bil total knee replacements, bil shoulder scope surgeries  PRECAUTIONS: Fall  SUBJECTIVE: Therapy is helping me.  I have less pain, improved strength and posture.  I can walk better due to improved posture.  I feel 23% overall improvement   PAIN:  Are you having pain? Neck is just sore not not pain/ NPRS scale: 5/10 Pain location: neck Lt>Rt PAIN TYPE: aching Pain description: intermittent and aching  Aggravating factors: nothing specific, at the end of the day from fatigue Relieving factors: traction, rest   OBJECTIVE: (objective measures completed at initial evaluation unless otherwise dated)  DIAGNOSTIC FINDINGS:  MRI on 10/24/21: No interval change of the syrinx (fluid filled cyst) spanning the C4 through C6 levels measuring up to 4 mm in diameter. 2. Mild degenerative changes of the cervical spine without high-grade spinal canal stenosis at any level. 3. Mild to moderate left neural foraminal narrowing at C3-4, unchanged.   PATIENT SURVEYS:  FOTO 36 (52 is goal) 12/12/21: 54-goal met   COGNITION: Overall cognitive status: Within functional limits for tasks assessed SENSATION: WFL   POSTURE: rounded shoulders and forward head   PALPATION: Trigger points in bil suboccipitals, upper traps and tension in bil cervical paraspinals.  Pt with reduced segmental mobility C3-7 and T1-5.  Pain reduction with manual cervical traction in supine.              CERVICAL ROM:    Active ROM A/PROM (deg) eval A/ROM 11/11/21 A/ROM 12/12/21  Flexion 50    Extension 30    Right lateral flexion 35    Left lateral flexion 40    Right rotation 60 65 70  Left rotation 70 80 75   (Blank rows = not tested)   UPPER EXTREMITY ROM:   Bil UE A/ROM limited by 25% in all directions.    UPPER EXTREMITY MMT:   MMT Right eval Left eval  Shoulder flexion 4-/5 4/5  Shoulder extension 4- 4  Shoulder abduction 4- 4-  Shoulder adduction      Shoulder extension      Shoulder internal rotation 4+ 4+  Shoulder external rotation 4- 4-  Middle trapezius      Lower trapezius      Elbow flexion      Elbow extension      Wrist flexion      Wrist extension      Wrist ulnar deviation      Wrist radial deviation      Wrist pronation      Wrist supination      Grip strength 32 29   (Blank rows = not tested)   TODAY'S TREATMENT:   12/09/21: Arm bike L1.6 min forward/3 min reverse- PT present to monitor and discuss progress Bicep Curls 4# 2x10 4# 3 was raise 12x Cervical mechanical Traction 18# 10 min 60/20   12/05/21: Arm bike L1.3  min forward/3 min reverse- PTA present to monitor and discuss progress 2# 3 way raise 10x Bicep Curls 5#  3x10x Seated green band: horizontal abduction, D2 and ER 2x10 bil each Cervical traction: 17# intermittent 60 second hold, 10 second rest x10  12/02/21: Arm bike L1.3  min forward/3 min reverse- PTA present to monitor and discuss progress 2# 3 way raise 10x Bicep Curls 4#  3x10x Cervical traction: 17# intermittent 60 second hold, 10 second rest x15    PATIENT EDUCATION:  Education details: Access Code: [email or web address] Person educated: Patient and Parent Education method: Explanation, Demonstration, and Handouts Education comprehension: verbalized understanding and returned demonstration     HOME EXERCISE PROGRAM: Access Code: [email or web address] URL: [email or web address] Date: 10/30/2021 Prepared by: Claiborne Billings   Exercises - Seated Cervical Flexion AROM  - 3 x daily - 7 x weekly - 1 sets - 3 reps - 20 hold - Seated Cervical Sidebending AROM  - 3 x daily - 7 x weekly - 1 sets - 3 reps - 20 hold - Seated Cervical Rotation AROM  - 3 x daily - 7 x weekly - 1 sets - 3 reps - 20 hold - Seated Correct Posture  - 1 x daily - 7 x weekly - 3 sets - 10 reps - Seated Scapular Retraction  - 5 x daily - 7 x weekly - 1 sets - 10 reps - 5 hold    ASSESSMENT:   CLINICAL IMPRESSION: Pt reports 23% overall improvement in symptoms since the start of care.  Pt reports improved strength, posture and flexibility overall.  Pt is independent in HEP for flexibility and strength.  Pt is responding well to cervical mechanical traction and is doing well with 18# of force.  Pt did well with advancement of weights for 3 way raises.  Cervical A/ROM is painful at end range and ROM is limited but functional. Patient will benefit from skilled PT to address the below impairments and improve overall function.   OBJECTIVE IMPAIRMENTS decreased mobility, decreased ROM, decreased strength, hypomobility, increased muscle spasms, impaired flexibility, impaired UE functional use, postural dysfunction, and pain.    ACTIVITY LIMITATIONS lifting, sitting, and reach over head   PARTICIPATION LIMITATIONS: cleaning, laundry, and driving   PERSONAL FACTORS 3+ comorbidities: cervical DDD, CVA, bil LE neuropathy,   are also affecting patient's functional outcome.    REHAB POTENTIAL: Good   CLINICAL DECISION MAKING: Evolving/moderate complexity   EVALUATION COMPLEXITY: Moderate   GOALS: Goals reviewed with patient? Yes   SHORT TERM GOALS: Target date: 11/27/2021    Be independent in initial HEP  Baseline:  Goal status: Goal met 11/18/21   2.  Verbalize postural modifications to improve alignment and reduce UE symptoms  Baseline:  Goal status: Goal met 11/18/21   3.  Demonstrate Rt cervical A/ROM rotation to > or = to 70 degrees to improve safety with driving Baseline: 65 Rt, 70 Lt (11/14/21) Goal status: In progress    4.  Report > or = to 30% reduction in neck pain and UE radiculopathy with ADLs and self-care Baseline: 20%  Goal status: 20% 12/05/21   LONG TERM GOALS: Target date: 02/06/22   Be independent in advanced HEP Baseline:  Goal status: In progress    2.  Improve FOTO to > or = to 52  Baseline: 54 (12/12/21) Goal status: MET   3.  Improve  bil grip to > or = to 45# to improve functional use and endurance with ADLs and self-care Baseline: 32/29 Goal status: In progress   4.  Report > or = to 60% reduction in neck pain and UE pain with ADLs and self-care  Baseline: 23% (12/12/21) Goal status: revised    5.  Demonstrate > or = to 80 degrees A/ROM rotation bilaterally to improve safety with driving  Baseline: 67/20 Goal status: in progress        PLAN: PT FREQUENCY: 1-2x/week   PT DURATION: 8 weeks   PLANNED INTERVENTIONS: Therapeutic exercises, Therapeutic activity, Neuromuscular re-education, Balance training, Gait training, Patient/Family education, Self Care, Joint mobilization, Joint manipulation, Aquatic Therapy, Dry Needling, Electrical stimulation, Spinal manipulation, Spinal mobilization, Cryotherapy, Moist heat, Taping, Traction, Ultrasound, Manual therapy, and Re-evaluation   PLAN FOR NEXT SESSION: Pt will have surgery next week and then be off x 1 week.  Pt will resume PT on 12/25/21.     Sigurd Sos, PT 12/12/21 1:10 PM

## 2021-12-16 ENCOUNTER — Encounter: Payer: Self-pay | Admitting: Physical Medicine & Rehabilitation

## 2021-12-17 ENCOUNTER — Other Ambulatory Visit (HOSPITAL_COMMUNITY): Payer: Self-pay

## 2021-12-20 ENCOUNTER — Ambulatory Visit (INDEPENDENT_AMBULATORY_CARE_PROVIDER_SITE_OTHER): Payer: 59 | Admitting: Psychologist

## 2021-12-20 ENCOUNTER — Other Ambulatory Visit (HOSPITAL_BASED_OUTPATIENT_CLINIC_OR_DEPARTMENT_OTHER): Payer: Self-pay

## 2021-12-20 DIAGNOSIS — F411 Generalized anxiety disorder: Secondary | ICD-10-CM

## 2021-12-20 DIAGNOSIS — F331 Major depressive disorder, recurrent, moderate: Secondary | ICD-10-CM | POA: Diagnosis not present

## 2021-12-20 MED FILL — Potassium Chloride Tab ER 10 mEq: ORAL | 90 days supply | Qty: 180 | Fill #1 | Status: CN

## 2021-12-20 NOTE — Progress Notes (Signed)
[place] Counselor/Therapist Progress Note  Patient ID: Jesse Bowers, MRN: [id number],    Date: 12/20/2021  Time Spent: 01:05 pm to 01:25 pm; total time: 20 minutes   This session was held via video webex teletherapy due to the coronavirus risk at this time. The patient consented to video teletherapy and was located at his home during this session. He is aware it is the responsibility of the patient to secure confidentiality on his end of the session. The provider was in a private home office for the duration of this session. Limits of confidentiality were discussed with the patient.   Treatment Type: Individual Therapy  Reported Symptoms: Experiencing less distress to thoughts  Mental Status Exam: Appearance:  Casual     Behavior: Appropriate  Motor: Normal  Speech/Language:  Normal Rate  Affect: Appropriate  Mood: normal  Thought process: normal  Thought content:   WNL  Sensory/Perceptual disturbances:   WNL  Orientation: oriented to person, place, and time/date  Attention: Good  Concentration: Good  Memory: WNL  Fund of knowledge:  Good  Insight:   Fair  Judgment:  Fair  Impulse Control: Good   Risk Assessment: Danger to Self:  No Self-injurious Behavior: No Danger to Others: No Duty to Warn:no Physical Aggression / Violence:No  Access to Firearms a concern: No  Gang Involvement:No   Subjective: Patient began the session the patient stating that he was doing better and indicated that defusion has assisted him. From there, patient voiced distress related to challenges with sleep. He talked about behavioral strategies he has already implemented. Patient was not interested in seeing a psychologist who specializes in sleep medicine. Patient stated that he wants to address concerns and then ended the session. He asked to follow up. He denied suicidal and homicidal ideation.    Interventions:  Worked on developing a therapeutic relationship with the patient.  Used active listening and reflective statements. Provided emotional support using empathy and validation. Praised the patient for doing better. Explored what has assisted the patient. Praised the patient for being able to implement defusion. Encouraged patient to continue using defusion. Identified goals for the session. Explored concerns related to sleep. Briefly processed the idea of not wearing Archivist. Explored whether or not patient would like to see a psychologist who specializes in sleep. Attempted to explore other needs. Provided empathic statements. Assessed for suicidal and homicidal ideation.  Homework: Implement defusion  Next Session: Emotional support and review homework.   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 disorder, recurrent, moderate   Plan:   Goals Alleviate depressive symptoms Recognize, accept, and cope with depressive feelings Develop healthy thinking patterns Develop healthy interpersonal relationships Reduce overall frequency, intensity, and duration of anxiety Stabilize anxiety level wile increasing ability to function Enhance ability to effectively cope with full variety of stressors Learn and implement coping skills that result in a reduction of anxiety   Objectives target date for all objectives is: 09/27/2022 Verbalize an understanding of the cognitive, physiological, and behavioral components of anxiety Learning and implement calming skills to reduce overall anxiety Verbalize an understanding of the role that cognitive biases play in excessive irrational worry and persistent anxiety symptoms Identify, challenge, and replace based fearful talk Learn and implement problem solving strategies Identify and engage in pleasant activities Learning and implement personal and interpersonal skills to reduce anxiety and improve interpersonal relationships Learn to accept limitations in life and commit to tolerating,  rather than avoiding, unpleasant emotions while accomplishing  meaningful goals Identify major life conflicts from the past and present that form the basis for present anxiety Maintain involvement in work, family, and social activities Reestablish a consistent sleep-wake cycle Cooperate with a medical evaluation  Cooperate with a medication evaluation by a physician Verbalize an accurate understanding of depression Verbalize an understanding of the treatment Identify and replace thoughts that support depression Learn and implement behavioral strategies Verbalize an understanding and resolution of current interpersonal problems Learn and implement problem solving and decision making skills Learn and implement conflict resolution skills to resolve interpersonal problems Verbalize an understanding of healthy and unhealthy emotions verbalize insight into how past relationships may be influence current experiences with depression Use mindfulness and acceptance strategies and increase value based behavior  Increase hopeful statements about the future.  Interventions Engage the patient in behavioral activation Use instruction, modeling, and role-playing to build the client's general social, communication, and/or conflict resolution skills Use Acceptance and Commitment Therapy to help client accept uncomfortable realities in order to accomplish value-consistent goals Reinforce the client's insight into the role of his/her past emotional pain and present anxiety  Support the client in following through with work, family, and social activities Teach and implement sleep hygiene practices  Refer the patient to a physician for a psychotropic medication consultation Monito the clint's psychotropic medication compliance Discuss how anxiety typically involves excessive worry, various bodily expressions of tension, and avoidance of what is threatening that interact to maintain the problem  Teach the patient  relaxation skills Assign the patient homework Discuss examples demonstrating that unrealistic worry overestimates the probability of threats and underestimates patient's ability  Assist the patient in analyzing his or her worries Help patient understand that avoidance is reinforcing  Consistent with treatment model, discuss how change in cognitive, behavioral, and interpersonal can help client alleviate depression CBT Behavioral activation help the client explore the relationship, nature of the dispute,  Help the client develop new interpersonal skills and relationships Conduct Problem solving therapy Teach conflict resolution skills Use a process-experiential approach Conduct TLDP Conduct ACT Evaluate need for psychotropic medication Monitor adherence to medication   Patient agreed and reviewed the treatment plan on 10/03/2020. Reassessed the treatment plan. Patient approved of the reassessment on 09/25/2021.   Conception Chancy, PsyD

## 2021-12-21 ENCOUNTER — Other Ambulatory Visit (HOSPITAL_BASED_OUTPATIENT_CLINIC_OR_DEPARTMENT_OTHER): Payer: Self-pay

## 2021-12-22 ENCOUNTER — Other Ambulatory Visit (HOSPITAL_BASED_OUTPATIENT_CLINIC_OR_DEPARTMENT_OTHER): Payer: Self-pay

## 2021-12-23 ENCOUNTER — Other Ambulatory Visit (HOSPITAL_BASED_OUTPATIENT_CLINIC_OR_DEPARTMENT_OTHER): Payer: Self-pay

## 2021-12-23 ENCOUNTER — Ambulatory Visit: Payer: 59

## 2021-12-23 MED ORDER — HYDROXYZINE HCL 25 MG PO TABS
25.0000 mg | ORAL_TABLET | Freq: Three times a day (TID) | ORAL | 2 refills | Status: DC | PRN
  Filled 2021-12-23 – 2022-01-14 (×3): qty 90, 30d supply, fill #0
  Filled 2022-02-07 – 2022-02-08 (×2): qty 90, 30d supply, fill #1

## 2021-12-24 ENCOUNTER — Other Ambulatory Visit (HOSPITAL_BASED_OUTPATIENT_CLINIC_OR_DEPARTMENT_OTHER): Payer: Self-pay

## 2021-12-24 ENCOUNTER — Encounter: Payer: Self-pay | Admitting: Physical Therapy

## 2021-12-24 MED ORDER — VITAMIN D (ERGOCALCIFEROL) 1.25 MG (50000 UNIT) PO CAPS
50000.0000 [IU] | ORAL_CAPSULE | ORAL | 0 refills | Status: DC
  Filled 2021-12-24: qty 12, 84d supply, fill #0

## 2021-12-25 ENCOUNTER — Other Ambulatory Visit (HOSPITAL_BASED_OUTPATIENT_CLINIC_OR_DEPARTMENT_OTHER): Payer: Self-pay

## 2021-12-25 MED ORDER — URSODIOL 250 MG PO TABS
ORAL_TABLET | ORAL | 2 refills | Status: DC
  Filled 2022-01-25: qty 180, 90d supply, fill #0
  Filled 2022-04-25: qty 180, 90d supply, fill #1
  Filled 2022-07-31: qty 180, 90d supply, fill #2

## 2021-12-27 ENCOUNTER — Ambulatory Visit (INDEPENDENT_AMBULATORY_CARE_PROVIDER_SITE_OTHER): Payer: 59 | Admitting: Psychologist

## 2021-12-27 ENCOUNTER — Other Ambulatory Visit (HOSPITAL_BASED_OUTPATIENT_CLINIC_OR_DEPARTMENT_OTHER): Payer: Self-pay

## 2021-12-27 DIAGNOSIS — F331 Major depressive disorder, recurrent, moderate: Secondary | ICD-10-CM | POA: Diagnosis not present

## 2021-12-27 DIAGNOSIS — F411 Generalized anxiety disorder: Secondary | ICD-10-CM | POA: Diagnosis not present

## 2021-12-27 NOTE — Progress Notes (Signed)
[place] Counselor/Therapist Progress Note  Patient ID: Jesse Bowers, MRN: [id number],    Date: 12/27/2021  Time Spent: 01:10 pm to 01:30 pm; total time: 20 minutes   This session was held via video webex teletherapy due to the coronavirus risk at this time. The patient consented to video teletherapy and was located at his home during this session. He is aware it is the responsibility of the patient to secure confidentiality on his end of the session. The provider was in a private home office for the duration of this session. Limits of confidentiality were discussed with the patient.   Treatment Type: Individual Therapy  Reported Symptoms: Mild distress due to being an impasse with clinician   Mental Status Exam: Appearance:  Casual     Behavior: Appropriate  Motor: Normal  Speech/Language:  Normal Rate  Affect: Appropriate  Mood: normal  Thought process: normal  Thought content:   WNL  Sensory/Perceptual disturbances:   WNL  Orientation: oriented to person, place, and time/date  Attention: Good  Concentration: Good  Memory: WNL  Fund of knowledge:  Good  Insight:   Fair  Judgment:  Fair  Impulse Control: Good   Risk Assessment: Danger to Self:  No Self-injurious Behavior: No Danger to Others: No Duty to Warn:no Physical Aggression / Violence:No  Access to Firearms a concern: No  Gang Involvement:No   Subjective: Patient began the session the patient described himself as okay. He voiced that he was unsure what to discuss. When clinician reflected on the idea of being at an "impasse" with the therapeutic process in working with the clinician patient agreed. Patient stated that he wanted to continue with therapy, and that it probably would be best to work with someone else. Patient voiced being open to the idea of EMDR. He briefly reflected on his relationship with the clinician. Per the patient he did learn something from therapy. He denied suicidal and  homicidal ideation.    Interventions:  Worked on developing a therapeutic relationship with the patient. Used active listening and reflective statements. Provided emotional support using empathy and validation. Praised the patient for doing better. Explored what has assisted the patient. Used summary and reflective statements. Worked on identifying goals for the session. Explored the idea that patient and clinician may be at an impasse with each other. Processed thoughts and emotions regarding that idea. Validated patient's experience. Reflected on how ACT may not have been the best fit to meet the needs of the patient at this current time. Provided psychoeducation about EMDR and encouraged exploring the option due to previous trauma in life. Validated the patient's experience with not wanting to experience negative thoughts. Reflected on the therapeutic process including acknowledging both positive parts of the therapeutic process and negative parts of the therapeutic process. Processed thoughts and emotions Provided resource for EMDR. Provided empathic statements. Assessed for suicidal and homicidal ideation.  Homework: NA  Next Session: NA  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 disorder, recurrent, moderate   Plan:   Goals Alleviate depressive symptoms Recognize, accept, and cope with depressive feelings Develop healthy thinking patterns Develop healthy interpersonal relationships Reduce overall frequency, intensity, and duration of anxiety Stabilize anxiety level wile increasing ability to function Enhance ability to effectively cope with full variety of stressors Learn and implement coping skills that result in a reduction of anxiety   Objectives target date for all objectives is: 09/27/2022 Verbalize an understanding of the cognitive, physiological, and behavioral components of  anxiety Learning and implement calming skills to reduce overall  anxiety Verbalize an understanding of the role that cognitive biases play in excessive irrational worry and persistent anxiety symptoms Identify, challenge, and replace based fearful talk Learn and implement problem solving strategies Identify and engage in pleasant activities Learning and implement personal and interpersonal skills to reduce anxiety and improve interpersonal relationships Learn to accept limitations in life and commit to tolerating, rather than avoiding, unpleasant emotions while accomplishing meaningful goals Identify major life conflicts from the past and present that form the basis for present anxiety Maintain involvement in work, family, and social activities Reestablish a consistent sleep-wake cycle Cooperate with a medical evaluation  Cooperate with a medication evaluation by a physician Verbalize an accurate understanding of depression Verbalize an understanding of the treatment Identify and replace thoughts that support depression Learn and implement behavioral strategies Verbalize an understanding and resolution of current interpersonal problems Learn and implement problem solving and decision making skills Learn and implement conflict resolution skills to resolve interpersonal problems Verbalize an understanding of healthy and unhealthy emotions verbalize insight into how past relationships may be influence current experiences with depression Use mindfulness and acceptance strategies and increase value based behavior  Increase hopeful statements about the future.  Interventions Engage the patient in behavioral activation Use instruction, modeling, and role-playing to build the client's general social, communication, and/or conflict resolution skills Use Acceptance and Commitment Therapy to help client accept uncomfortable realities in order to accomplish value-consistent goals Reinforce the client's insight into the role of his/her past emotional pain and present  anxiety  Support the client in following through with work, family, and social activities Teach and implement sleep hygiene practices  Refer the patient to a physician for a psychotropic medication consultation Monito the clint's psychotropic medication compliance Discuss how anxiety typically involves excessive worry, various bodily expressions of tension, and avoidance of what is threatening that interact to maintain the problem  Teach the patient relaxation skills Assign the patient homework Discuss examples demonstrating that unrealistic worry overestimates the probability of threats and underestimates patient's ability  Assist the patient in analyzing his or her worries Help patient understand that avoidance is reinforcing  Consistent with treatment model, discuss how change in cognitive, behavioral, and interpersonal can help client alleviate depression CBT Behavioral activation help the client explore the relationship, nature of the dispute,  Help the client develop new interpersonal skills and relationships Conduct Problem solving therapy Teach conflict resolution skills Use a process-experiential approach Conduct TLDP Conduct ACT Evaluate need for psychotropic medication Monitor adherence to medication   Patient agreed and reviewed the treatment plan on 10/03/2020. Reassessed the treatment plan. Patient approved of the reassessment on 09/25/2021.   Conception Chancy, PsyD

## 2022-01-01 ENCOUNTER — Ambulatory Visit: Payer: 59 | Attending: Physical Medicine & Rehabilitation

## 2022-01-01 ENCOUNTER — Encounter: Payer: Self-pay | Admitting: Physical Medicine & Rehabilitation

## 2022-01-01 ENCOUNTER — Other Ambulatory Visit: Payer: Self-pay | Admitting: Physical Medicine & Rehabilitation

## 2022-01-01 ENCOUNTER — Telehealth: Payer: Self-pay

## 2022-01-01 ENCOUNTER — Other Ambulatory Visit (HOSPITAL_BASED_OUTPATIENT_CLINIC_OR_DEPARTMENT_OTHER): Payer: Self-pay

## 2022-01-01 DIAGNOSIS — R293 Abnormal posture: Secondary | ICD-10-CM | POA: Insufficient documentation

## 2022-01-01 DIAGNOSIS — M542 Cervicalgia: Secondary | ICD-10-CM | POA: Insufficient documentation

## 2022-01-01 DIAGNOSIS — M47816 Spondylosis without myelopathy or radiculopathy, lumbar region: Secondary | ICD-10-CM

## 2022-01-01 DIAGNOSIS — R252 Cramp and spasm: Secondary | ICD-10-CM | POA: Diagnosis present

## 2022-01-01 MED ORDER — MORPHINE SULFATE ER 30 MG PO TBCR
30.0000 mg | EXTENDED_RELEASE_TABLET | Freq: Two times a day (BID) | ORAL | 0 refills | Status: DC
  Filled 2022-01-01 – 2022-01-03 (×2): qty 60, 30d supply, fill #0

## 2022-01-01 NOTE — Telephone Encounter (Signed)
Dr.Swartz is out of the office. Please advise.  Filled  Written  ID  Drug  QTY  Days  Prescriber  RX #  Dispenser  Refill  Daily Dose*  Pymt Type  PMP  12/06/2021 12/04/2021 1  Morphine Sulf Er 30 Mg Tablet 60.00 30 Za Swa [id number] Con (7708) 0/0 60.00 MME Other [place] 12/05/2021 12/05/2021 1  Diazepam 5 Mg Tablet 60.00 30 Ed Lov [id number] Con (7708) 0/2 1.00 LME Other [place] 12/02/2021 11/25/2021 1  Testosterone Cyp 200 Mg/ml 10.00 70 Mi Mck [id number] Con (7708) 0/1  Other [place]  Patient will out of Morphine Sulf ER 30 MG  on 01/06/2022. Please send to [facility].

## 2022-01-01 NOTE — Telephone Encounter (Signed)
Dr [name] note was Reviewed.  PMP was Reviewed.  Morphine e-scribed to pharmacy, Jesse Bowers is awre via My- Chart

## 2022-01-01 NOTE — Therapy (Signed)
OUTPATIENT PHYSICAL THERAPY TREATMENT NOTE   Patient Name: Jesse Bowers MRN: [id number] DOB:24-Mar-1965, 56 y.o., adult Today's Date: 01/01/2022  PCP: NA-pt reports that he does not currently have a PCP REFERRING PROVIDER: Alger Simons, MD  END OF SESSION:   PT End of Session - 01/01/22 1443     Visit Number 11    Date for PT Re-Evaluation 02/06/22    Authorization Type UMR    PT Start Time 1415   late   PT Stop Time 1453    PT Time Calculation (min) 38 min    Activity Tolerance Patient tolerated treatment well    Behavior During Therapy WFL for tasks assessed/performed                      Past Medical History:  Diagnosis Date   Abnormal weight loss    Anxiety    Arthritis    Cataract    OU   Celiac disease    Cervical neck pain with evidence of disc disease    patient has a cyst    Chronic constipation    Chronic diastolic heart failure ([place])    Pt. 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   Family history of adverse reaction to anesthesia    family has problems with anesthesia of nausea and vomiting    GERD (gastroesophageal reflux disease)    History of in 20's   Gluten enteropathy    H/O parotitis    right    Hard of hearing    History of kidney stones    History of retinal tear    Bilateral   History of staph infection    required wound vac   Hx-TIA (transient ischemic attack)    2015   Kidney stones 08/2020   LVH (left ventricular hypertrophy) 12/15/2016   Mild, noted on ECHO   MVP (mitral valve prolapse)    NAFL (nonalcoholic fatty liver)    Neck pain    Neuromuscular disorder ([place])    bilateral neuropathy feet.   Nuclear sclerotic cataract of both eyes 09/08/2019   Pneumonia 12/17/2010   Polycythemia     Polycythemia, secondary    PONV (postoperative nausea and vomiting)    Protein C deficiency (HCC)    Dr. Anne Fu   Psoriasis    16 X10 cm psoriatic rash on sole of left foot ; open and occ scant bleeding;    psoriatic arthritis    PTSD (post-traumatic stress disorder)    Scaphoid fracture of wrist 09/23/2013   Seizure ([place])    childhood, medication until age 2 then weanned completely off   Sleep apnea    split night study last done by Dr. [name] 06/18/15 shows severe OSA, CSA, and hypersomnia, rec bipap   Splenomegaly    Stenosis of ureteropelvic junction (UPJ)    left   Stroke [facility])    CVA vs TIA in left cerebrum causing slight right sided weakness-Dr. [name] follows   Syrinx of spinal cord ([employer]) 01/06/2014   c spine on MRI   Tachycardia    hx of    Transfusion history    past history- none recent, after surgeries due to blood loss  Transgender with history of gender affirmation surgery    Wears glasses    Wears hearing aid    Past Surgical History:  Procedure Laterality Date   ABDOMINAL HYSTERECTOMY Bilateral 1994   TAH, BSO- tranverse incision at 56 yo   ANKLE ARTHROSCOPY WITH RECONSTRUCTION Right 2007   CHOLECYSTECTOMY     laparoscopic   COLONOSCOPY     x3   EYE SURGERY     Left eye 03/02/2018, right 02/15/2018   HIP ARTHROSCOPY W/ LABRAL REPAIR Right 05/11/2013   acetabular labral tear 03/30/2013   KNEE ARTHROPLASTY Right    KNEE JOINT MANIPULATION Left    x3 under anesthesia   KNEE SURGERY Bilateral 1984   Right ACL, left PCL repair   LITHOTRIPSY  2005   LIVER BIOPSY  2013   normal results.   MASTECTOMY Bilateral    prior to 2009   MOUTH SURGERY     NASAL SEPTUM SURGERY N/A 09/20/2015   by ENT Dr. [name]   OVARIAN CYST SURGERY Left    size of grapefruit, was informed that she had shortened vagina   SHOULDER SURGERY Bilateral    Right 08/15/2016, Left 11/15/2016   THUMB ARTHROSCOPY Left    THYROIDECTOMY, PARTIAL Left 2008   TOTAL KNEE ARTHROPLASTY  Right 08/23/2018   Procedure: TOTAL KNEE ARTHROPLASTY;  Surgeon: [name], MD;  Location: [facility];  Service: Orthopedics;  Laterality: Right;  61mn   TOTAL KNEE REVISION Left 02/06/2016   Procedure: LEFT TOTAL KNEE REVISION;  Surgeon: FGaynelle Arabian MD;  Location: [facility];  Service: Orthopedics;  Laterality: Left;   TOTAL KNEE REVISION Left 04/22/2017   Procedure: Left knee polyethylene revision;  Surgeon: AGaynelle Arabian MD;  Location: [facility];  Service: Orthopedics;  Laterality: Left;   UPPER GI ENDOSCOPY  2003   Patient Active Problem List   Diagnosis Date Noted   Posterior vitreous detachment of right eye 11/18/2021   Spondylosis of lumbar spine 10/03/2020   Pseudophakia of both eyes 05/07/2020   Retinal telangiectasis of both eyes 05/07/2020   Lattice degeneration of peripheral retina, left 09/08/2019   Lattice degeneration, right eye 09/08/2019   Chronic diastolic heart failure ([place] 05/24/2019   Urinary dysfunction 04/12/2019   Osteoarthritis of right knee 08/23/2018   Constipation due to opioid therapy 03/30/2018   SNHL (sensorineural hearing loss) 12/04/2017   Abnormal urinary stream 12/03/2017   Osteoarthritis of carpometacarpal (CMC) joint of thumb 11/30/2017   Muscle weakness 11/17/2017   Transient vision disturbance 11/12/2017   Bilateral hand pain 10/30/2017   Pain of left hip joint 10/09/2017   Gynecomastia 07/10/2017   Iron deficiency anemia 07/05/2017   Spasticity 05/20/2017   Lumbar radiculitis 04/20/2017   Ulnar 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, left 11/28/2016   Idiopathic peripheral neuropathy 11/28/2016   Failed total knee arthroplasty, 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   Long term (current) use of anticoagulants 08/23/2015   Right upper quadrant abdominal pain 08/23/2015   Memory loss 05/10/2015   Gait abnormality 04/07/2015   Alkaline phosphatase elevation 04/07/2015   History of thrombosis 03/26/2015   Medial epicondylitis 02/07/2015   Cognitive decline 12/21/2014   Leukopenia  12/05/2014   Rotator cuff syndrome of right shoulder 10/27/2014   Status post left knee replacement 08/22/2014   Left lateral epicondylitis 08/22/2014   Chronic cerebral ischemia 08/18/2014   Arthrofibrosis of knee joint 08/17/2014   Cubital canal compression syndrome, left 08/17/2014   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   Chronic pain syndrome 04/10/2014   Insomnia 04/10/2014   Chronic  non-specific white matter lesions on MRI 04/10/2014   CFS (chronic fatigue syndrome) 04/10/2014   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[place]) 03/01/2014   Biceps tendonitis on left 03/01/2014   Polycythemia vera ([place]) 12/27/2013   H/O TIA (transient ischemic attack) and stroke 12/27/2013   Neck pain 12/27/2013   OSA (obstructive sleep apnea) 12/08/2013   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/31/2013   Depression with anxiety 08/04/2013   Protein C deficiency ([place]) 08/01/2013   Post traumatic stress disorder (PTSD) 08/01/2013   Speech abnormality 07/25/2013   Obesity 05/10/2013   Lower extremity edema 05/10/2013   GERD (gastroesophageal reflux disease) 05/10/2013   Arthritis 05/10/2013   OA (osteoarthritis) of knee 03/15/2013   Headache 10/25/2012   Palpitations 10/18/2012   Fatty liver determined by biopsy 06/01/2012   Arthropathic psoriasis, unspecified ([place]) 04/29/2012   Abnormal liver enzymes 03/29/2012   Psoriatic arthritis ([place]) 12/29/2011   Left Renal Hydronephrosis 12/11/2010   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     THERAPY DIAG:  Abnormal posture  Cervicalgia  Cramp and spasm  Rationale for Evaluation and Treatment Rehabilitation  PERTINENT HISTORY: Lumbar stenosis L5-S1, failed TKA 2019, neuropathy in bil feet, PTSD, CVA vs TIA Rt weakness, head trauma 2019, Rt thumb  replacement, bil total knee replacements, bil shoulder scope surgeries  PRECAUTIONS: Fall  SUBJECTIVE: My neck hurts when I turn left.  Otherwise it has been ok due to recovering from surgery.   PAIN:  Are you having pain? Neck is just sore not not pain/ NPRS scale: 5/10 Pain location: neck Lt>Rt PAIN TYPE: aching Pain description: intermittent and aching  Aggravating factors: nothing specific, at the end of the day from fatigue Relieving factors: traction, rest   OBJECTIVE: (objective measures completed at initial evaluation unless otherwise dated)  DIAGNOSTIC FINDINGS:  MRI on 10/24/21: No interval change of the syrinx (fluid filled cyst) spanning the C4 through C6 levels measuring up to 4 mm in diameter. 2. Mild degenerative changes of the cervical spine without high-grade spinal canal stenosis at any level. 3. Mild to moderate left neural foraminal narrowing at C3-4, unchanged.   PATIENT SURVEYS:  FOTO 36 (52 is goal) 12/12/21: 54-goal met   COGNITION: Overall cognitive status: Within functional limits for tasks assessed SENSATION: WFL   POSTURE: rounded shoulders and forward head   PALPATION: Trigger points in bil suboccipitals, upper traps and tension in bil cervical paraspinals.  Pt with reduced segmental mobility C3-7 and T1-5.  Pain reduction with manual cervical traction in supine.              CERVICAL ROM:    Active ROM A/PROM (deg) eval A/ROM 11/11/21 A/ROM 12/12/21  Flexion 50    Extension 30    Right lateral flexion 35    Left lateral flexion 40    Right rotation 60 65 70  Left rotation 70 80 75   (Blank rows = not tested)   UPPER EXTREMITY ROM:   Bil UE A/ROM limited by 25% in all directions.    UPPER EXTREMITY MMT:   MMT Right eval Left eval  Shoulder flexion 4-/5 4/5  Shoulder extension 4- 4  Shoulder abduction 4- 4-  Shoulder adduction      Shoulder extension      Shoulder internal rotation 4+ 4+  Shoulder external rotation 4- 4-  Middle trapezius      Lower trapezius      Elbow flexion      Elbow extension      Wrist flexion      Wrist extension      Wrist ulnar deviation      Wrist radial deviation      Wrist pronation      Wrist supination      Grip strength 32 29   (Blank rows = not tested)   TODAY'S TREATMENT:  01/01/22: Arm bike L1.6 min forward/3 min reverse- PT present to monitor and discuss progress Manual: elongation and release to bil neck and upper traps, side glide and rotational mobs to C3-6 Cervical traction: 17# intermittent 60 second hold, 10 second rest x10  12/09/21: Arm bike L1.6 min forward/3 min reverse- PT present to monitor and discuss progress Bicep Curls 4# 2x10 4# 3 was raise 12x Cervical mechanical Traction 18# 10 min 60/20   12/05/21: Arm bike L1.3  min forward/3 min reverse- PTA present to monitor and discuss progress Manual: elongation and release to bil neck and upper traps, side glide and rotational mobs to C3-6 Cervical traction: 17# intermittent 60 second hold, 10 second rest x10    PATIENT EDUCATION:  Education details: Access Code: [email or web address] Person educated: Patient and Parent Education method: Explanation, Demonstration, and Handouts Education comprehension: verbalized understanding and returned demonstration     HOME EXERCISE PROGRAM: Access Code: [email or web address] URL: [email or web address] Date: 10/30/2021 Prepared by: Claiborne Billings   Exercises - Seated Cervical Flexion AROM  - 3 x daily - 7 x weekly - 1 sets - 3 reps - 20 hold - Seated Cervical Sidebending AROM  - 3 x daily - 7 x weekly - 1 sets - 3 reps - 20 hold - Seated Cervical Rotation AROM  - 3 x daily - 7 x weekly - 1 sets - 3 reps - 20 hold - Seated Correct Posture  - 1 x daily - 7 x weekly - 3 sets - 10 reps - Seated Scapular Retraction  - 5 x daily - 7 x weekly - 1 sets - 10 reps - 5 hold   ASSESSMENT:   CLINICAL IMPRESSION: Pt with lapse in treatment due to recent abdominal surgery.  Pt is  not allowed to do any lifting so this was eliminated from his exercises today.  Pt with tension in bil neck muscles and reduced segmental mobility that improved with mobs today.   Pt is responding well to cervical mechanical traction and is doing well with 17# of force.  Cervical A/ROM is painful at end range and ROM is limited but functional. Patient will benefit from skilled PT to address the below impairments and improve overall function.   OBJECTIVE IMPAIRMENTS decreased mobility, decreased ROM, decreased strength, hypomobility, increased muscle spasms, impaired flexibility, impaired UE functional use, postural dysfunction, and pain.    ACTIVITY LIMITATIONS lifting, sitting, and reach over head   PARTICIPATION LIMITATIONS: cleaning, laundry, and driving   PERSONAL FACTORS 3+ comorbidities: cervical DDD, CVA, bil LE neuropathy,   are also affecting patient's functional outcome.    REHAB POTENTIAL: Good   CLINICAL DECISION MAKING: Evolving/moderate complexity   EVALUATION COMPLEXITY: Moderate   GOALS: Goals reviewed with patient? Yes   SHORT TERM GOALS: Target date: 11/27/2021    Be independent in initial HEP Baseline:  Goal status: Goal met 11/18/21   2.  Verbalize postural modifications to improve alignment and reduce UE symptoms  Baseline:  Goal status: Goal met  11/18/21   3.  Demonstrate Rt cervical A/ROM rotation to > or = to 70 degrees to improve safety with driving Baseline: 65 Rt, 70 Lt (11/14/21) Goal status: In progress    4.  Report > or = to 30% reduction in neck pain and UE radiculopathy with ADLs and self-care Baseline: 20%  Goal status: 20% 12/05/21   LONG TERM GOALS: Target date: 02/06/22   Be independent in advanced HEP Baseline:  Goal status: In progress    2.  Improve FOTO to > or = to 52  Baseline: 54 (12/12/21) Goal status: MET   3.  Improve bil grip to > or = to 45# to improve functional use and endurance with ADLs and self-care Baseline: 32/29 Goal  status: In progress   4.  Report > or = to 60% reduction in neck pain and UE pain with ADLs and self-care  Baseline: 23% (12/12/21) Goal status: revised    5.  Demonstrate > or = to 80 degrees A/ROM rotation bilaterally to improve safety with driving  Baseline: 51/89 Goal status: in progress        PLAN: PT FREQUENCY: 1-2x/week   PT DURATION: 8 weeks   PLANNED INTERVENTIONS: Therapeutic exercises, Therapeutic activity, Neuromuscular re-education, Balance training, Gait training, Patient/Family education, Self Care, Joint mobilization, Joint manipulation, Aquatic Therapy, Dry Needling, Electrical stimulation, Spinal manipulation, Spinal mobilization, Cryotherapy, Moist heat, Taping, Traction, Ultrasound, Manual therapy, and Re-evaluation   PLAN FOR NEXT SESSION: continue stretching and postural strength as allowed post-surgery   Sigurd Sos, PT 01/01/22 2:45 PM   [facility] [address] [place], [place] [address] Phone # [phone] Fax [phone]

## 2022-01-02 ENCOUNTER — Other Ambulatory Visit (HOSPITAL_COMMUNITY): Payer: Self-pay

## 2022-01-02 ENCOUNTER — Other Ambulatory Visit (HOSPITAL_BASED_OUTPATIENT_CLINIC_OR_DEPARTMENT_OTHER): Payer: Self-pay

## 2022-01-02 MED ORDER — TESTOSTERONE 25 MG/2.5GM (1%) TD GEL
TRANSDERMAL | 2 refills | Status: DC
  Filled 2022-01-02: qty 75, 90d supply, fill #0

## 2022-01-03 ENCOUNTER — Other Ambulatory Visit (HOSPITAL_BASED_OUTPATIENT_CLINIC_OR_DEPARTMENT_OTHER): Payer: Self-pay

## 2022-01-03 MED ORDER — METRONIDAZOLE 500 MG PO TABS
500.0000 mg | ORAL_TABLET | Freq: Three times a day (TID) | ORAL | 0 refills | Status: DC
  Filled 2022-01-03: qty 21, 7d supply, fill #0

## 2022-01-04 ENCOUNTER — Other Ambulatory Visit (HOSPITAL_BASED_OUTPATIENT_CLINIC_OR_DEPARTMENT_OTHER): Payer: Self-pay

## 2022-01-06 ENCOUNTER — Other Ambulatory Visit (HOSPITAL_BASED_OUTPATIENT_CLINIC_OR_DEPARTMENT_OTHER): Payer: Self-pay

## 2022-01-06 ENCOUNTER — Encounter: Payer: 59 | Admitting: Physical Medicine and Rehabilitation

## 2022-01-06 MED ORDER — TESTOSTERONE 25 MG/2.5GM (1%) TD GEL
TRANSDERMAL | 2 refills | Status: DC
  Filled 2022-01-06 – 2022-01-09 (×2): qty 75, 30d supply, fill #0
  Filled 2022-02-15: qty 75, 30d supply, fill #1
  Filled 2022-03-18: qty 75, 30d supply, fill #2

## 2022-01-07 ENCOUNTER — Ambulatory Visit: Payer: 59

## 2022-01-07 ENCOUNTER — Other Ambulatory Visit (HOSPITAL_BASED_OUTPATIENT_CLINIC_OR_DEPARTMENT_OTHER): Payer: Self-pay

## 2022-01-07 MED ORDER — METHOTREXATE SODIUM 2.5 MG PO TABS
15.0000 mg | ORAL_TABLET | ORAL | 0 refills | Status: DC
  Filled 2022-01-07 – 2022-01-11 (×2): qty 72, 84d supply, fill #0

## 2022-01-08 ENCOUNTER — Encounter: Payer: 59 | Admitting: Physical Medicine and Rehabilitation

## 2022-01-09 ENCOUNTER — Other Ambulatory Visit (HOSPITAL_BASED_OUTPATIENT_CLINIC_OR_DEPARTMENT_OTHER): Payer: Self-pay

## 2022-01-11 ENCOUNTER — Other Ambulatory Visit (HOSPITAL_BASED_OUTPATIENT_CLINIC_OR_DEPARTMENT_OTHER): Payer: Self-pay

## 2022-01-13 ENCOUNTER — Other Ambulatory Visit (HOSPITAL_BASED_OUTPATIENT_CLINIC_OR_DEPARTMENT_OTHER): Payer: Self-pay

## 2022-01-13 ENCOUNTER — Encounter: Payer: Self-pay | Admitting: Physical Medicine and Rehabilitation

## 2022-01-13 ENCOUNTER — Encounter: Payer: Self-pay | Admitting: Physical Therapy

## 2022-01-13 ENCOUNTER — Ambulatory Visit: Payer: 59 | Admitting: Physical Therapy

## 2022-01-13 ENCOUNTER — Encounter: Payer: 59 | Attending: Physical Medicine & Rehabilitation | Admitting: Physical Medicine and Rehabilitation

## 2022-01-13 VITALS — BP 130/84 | HR 94 | Ht 69.0 in | Wt 208.0 lb

## 2022-01-13 DIAGNOSIS — R252 Cramp and spasm: Secondary | ICD-10-CM

## 2022-01-13 DIAGNOSIS — R293 Abnormal posture: Secondary | ICD-10-CM | POA: Diagnosis not present

## 2022-01-13 DIAGNOSIS — G5791 Unspecified mononeuropathy of right lower limb: Secondary | ICD-10-CM | POA: Insufficient documentation

## 2022-01-13 DIAGNOSIS — M542 Cervicalgia: Secondary | ICD-10-CM

## 2022-01-13 NOTE — Therapy (Signed)
OUTPATIENT PHYSICAL THERAPY TREATMENT NOTE   Patient Name: Jesse Bowers MRN: [id number] DOB:[birth year], 56 y.o., adult Today's Date: 01/13/2022  PCP: NA-pt reports that he does not currently have a PCP REFERRING PROVIDER: Alger Simons, MD  END OF SESSION:   PT End of Session - 01/13/22 1234     Visit Number 12    Date for PT Re-Evaluation 02/06/22    Authorization Type UMR    PT Start Time 1234    PT Stop Time 1310    PT Time Calculation (min) 36 min    Activity Tolerance Patient tolerated treatment well    Behavior During Therapy WFL for tasks assessed/performed                       Past Medical History:  Diagnosis Date   Abnormal weight loss    Anxiety    Arthritis    Cataract    OU   Celiac disease    Cervical neck pain with evidence of disc disease    patient has a cyst    Chronic constipation    Chronic diastolic heart failure ([place])    Pt. 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   Family history of adverse reaction to anesthesia    family has problems with anesthesia of nausea and vomiting    GERD (gastroesophageal reflux disease)    History of in 20's   Gluten enteropathy    H/O parotitis    right    Hard of hearing    History of kidney stones    History of retinal tear    Bilateral   History of staph infection    required wound vac   Hx-TIA (transient ischemic attack)    2015   Kidney stones 08/2020   LVH (left ventricular hypertrophy) 12/15/2016   Mild, noted on ECHO   MVP (mitral valve prolapse)    NAFL (nonalcoholic fatty liver)    Neck pain    Neuromuscular disorder ([place])    bilateral neuropathy feet.   Nuclear sclerotic cataract of both eyes 09/08/2019   Pneumonia 12/17/2010   Polycythemia     Polycythemia, secondary    PONV (postoperative nausea and vomiting)    Protein C deficiency (HCC)    Dr. Anne Fu   Psoriasis    16 X10 cm psoriatic rash on sole of left foot ; open and occ scant bleeding;    psoriatic arthritis    PTSD (post-traumatic stress disorder)    Scaphoid fracture of wrist 09/23/2013   Seizure ([place])    childhood, medication until age 59 then weanned completely off   Sleep apnea    split night study last done by Dr. [name] 06/18/15 shows severe OSA, CSA, and hypersomnia, rec bipap   Splenomegaly    Stenosis of ureteropelvic junction (UPJ)    left   Stroke [facility])    CVA vs TIA in left cerebrum causing slight right sided weakness-Dr. [name] follows   Syrinx of spinal cord ([place]) 01/06/2014   c spine on MRI   Tachycardia    hx of    Transfusion history    past history- none recent, after surgeries due to blood loss  Transgender with history of gender affirmation surgery    Wears glasses    Wears hearing aid    Past Surgical History:  Procedure Laterality Date   ABDOMINAL HYSTERECTOMY Bilateral 1994   TAH, BSO- tranverse incision at 56 yo   ANKLE ARTHROSCOPY WITH RECONSTRUCTION Right 2007   CHOLECYSTECTOMY     laparoscopic   COLONOSCOPY     x3   EYE SURGERY     Left eye 03/02/2018, right 02/15/2018   HIP ARTHROSCOPY W/ LABRAL REPAIR Right 05/11/2013   acetabular labral tear 03/30/2013   KNEE ARTHROPLASTY Right    KNEE JOINT MANIPULATION Left    x3 under anesthesia   KNEE SURGERY Bilateral 1984   Right ACL, left PCL repair   LITHOTRIPSY  2005   LIVER BIOPSY  2013   normal results.   MASTECTOMY Bilateral    prior to 2009   MOUTH SURGERY     NASAL SEPTUM SURGERY N/A 09/20/2015   by ENT Dr. [name]   OVARIAN CYST SURGERY Left    size of grapefruit, was informed that she had shortened vagina   SHOULDER SURGERY Bilateral    Right 08/15/2016, Left 11/15/2016   THUMB ARTHROSCOPY Left    THYROIDECTOMY, PARTIAL Left 2008   TOTAL KNEE ARTHROPLASTY  Right 08/23/2018   Procedure: TOTAL KNEE ARTHROPLASTY;  Surgeon: [name], MD;  Location: [facility];  Service: Orthopedics;  Laterality: Right;  1mn   TOTAL KNEE REVISION Left 02/06/2016   Procedure: LEFT TOTAL KNEE REVISION;  Surgeon: FGaynelle Arabian MD;  Location: [facility];  Service: Orthopedics;  Laterality: Left;   TOTAL KNEE REVISION Left 04/22/2017   Procedure: Left knee polyethylene revision;  Surgeon: AGaynelle Arabian MD;  Location: [facility];  Service: Orthopedics;  Laterality: Left;   UPPER GI ENDOSCOPY  2003   Patient Active Problem List   Diagnosis Date Noted   Posterior vitreous detachment of right eye 11/18/2021   Spondylosis of lumbar spine 10/03/2020   Pseudophakia of both eyes 05/07/2020   Retinal telangiectasis of both eyes 05/07/2020   Lattice degeneration of peripheral retina, left 09/08/2019   Lattice degeneration, right eye 09/08/2019   Chronic diastolic heart failure ([place] 05/24/2019   Urinary dysfunction 04/12/2019   Osteoarthritis of right knee 08/23/2018   Constipation due to opioid therapy 03/30/2018   SNHL (sensorineural hearing loss) 12/04/2017   Abnormal urinary stream 12/03/2017   Osteoarthritis of carpometacarpal (CMC) joint of thumb 11/30/2017   Muscle weakness 11/17/2017   Transient vision disturbance 11/12/2017   Bilateral hand pain 10/30/2017   Pain of left hip joint 10/09/2017   Gynecomastia 07/10/2017   Iron deficiency anemia 07/05/2017   Spasticity 05/20/2017   Lumbar radiculitis 04/20/2017   Ulnar 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, left 11/28/2016   Idiopathic peripheral neuropathy 11/28/2016   Failed total knee arthroplasty, 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   Long term (current) use of anticoagulants 08/23/2015   Right upper quadrant abdominal pain 08/23/2015   Memory loss 05/10/2015   Gait abnormality 04/07/2015   Alkaline phosphatase elevation 04/07/2015   History of thrombosis 03/26/2015   Medial epicondylitis 02/07/2015   Cognitive decline 12/21/2014   Leukopenia  12/05/2014   Rotator cuff syndrome of right shoulder 10/27/2014   Status post left knee replacement 08/22/2014   Left lateral epicondylitis 08/22/2014   Chronic cerebral ischemia 08/18/2014   Arthrofibrosis of knee joint 08/17/2014   Cubital canal compression syndrome, left 08/17/2014   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[place] 04/10/2014   Chronic pain syndrome 04/10/2014   Insomnia 04/10/2014   Chronic  non-specific white matter lesions on MRI 04/10/2014   CFS (chronic fatigue syndrome) 04/10/2014   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[place]) 03/01/2014   Biceps tendonitis on left 03/01/2014   Polycythemia vera ([place]) 12/27/2013   H/O TIA (transient ischemic attack) and stroke 12/27/2013   Neck pain 12/27/2013   OSA (obstructive sleep apnea) 12/08/2013   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/31/2013   Depression with anxiety 08/04/2013   Protein C deficiency ([place]) 08/01/2013   Post traumatic stress disorder (PTSD) 08/01/2013   Speech abnormality 07/25/2013   Obesity 05/10/2013   Lower extremity edema 05/10/2013   GERD (gastroesophageal reflux disease) 05/10/2013   Arthritis 05/10/2013   OA (osteoarthritis) of knee 03/15/2013   Headache 10/25/2012   Palpitations 10/18/2012   Fatty liver determined by biopsy 06/01/2012   Arthropathic psoriasis, unspecified ([place]) 04/29/2012   Abnormal liver enzymes 03/29/2012   Psoriatic arthritis ([place]) 12/29/2011   Left Renal Hydronephrosis 12/11/2010   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     THERAPY DIAG:  Abnormal posture  Cervicalgia  Cramp and spasm  Rationale for Evaluation and Treatment Rehabilitation  PERTINENT HISTORY: Lumbar stenosis L5-S1, failed TKA 2019, neuropathy in bil feet, PTSD, CVA vs TIA Rt weakness, head trauma 2019, Rt thumb  replacement, bil total knee replacements, bil shoulder scope surgeries  PRECAUTIONS: Fall  SUBJECTIVE: Neck is about the same as last visit. I want to try the arm lifts today.   PAIN:  Are you having pain? Neck is just sore not not pain/ NPRS scale: 5/10 Pain location: neck Lt>Rt PAIN TYPE: aching Pain description: intermittent and aching  Aggravating factors: nothing specific, at the end of the day from fatigue Relieving factors: traction, rest   OBJECTIVE: (objective measures completed at initial evaluation unless otherwise dated)  DIAGNOSTIC FINDINGS:  MRI on 10/24/21: No interval change of the syrinx (fluid filled cyst) spanning the C4 through C6 levels measuring up to 4 mm in diameter. 2. Mild degenerative changes of the cervical spine without high-grade spinal canal stenosis at any level. 3. Mild to moderate left neural foraminal narrowing at C3-4, unchanged.   PATIENT SURVEYS:  FOTO 36 (52 is goal) 12/12/21: 54-goal met   COGNITION: Overall cognitive status: Within functional limits for tasks assessed SENSATION: WFL   POSTURE: rounded shoulders and forward head   PALPATION: Trigger points in bil suboccipitals, upper traps and tension in bil cervical paraspinals.  Pt with reduced segmental mobility C3-7 and T1-5.  Pain reduction with manual cervical traction in supine.              CERVICAL ROM:    Active ROM A/PROM (deg) eval A/ROM 11/11/21 A/ROM 12/12/21  Flexion 50    Extension 30    Right lateral flexion 35    Left lateral flexion 40    Right rotation 60 65 70  Left rotation 70 80 75   (Blank rows = not tested)   UPPER EXTREMITY ROM:   Bil UE A/ROM limited by 25% in all directions.    UPPER EXTREMITY MMT:   MMT Right eval Left eval  Shoulder flexion 4-/5 4/5  Shoulder extension 4- 4  Shoulder abduction 4- 4-  Shoulder adduction      Shoulder extension      Shoulder internal rotation 4+ 4+  Shoulder external rotation 4- 4-  Middle  trapezius       Lower trapezius      Elbow flexion      Elbow extension      Wrist flexion      Wrist extension      Wrist ulnar deviation      Wrist radial deviation      Wrist pronation      Wrist supination      Grip strength 32 29   (Blank rows = not tested)   TODAY'S TREATMENT:   01/13/22: Arm bike; L1.6 4x4 with PTA present to monitor and discuss current status.  Bicep Curls 4# 2x10 Cervical traction: 17# intermittent 60 second hold, 10 second rest x10 4# 3 was raise below 90 degrees F12x  01/01/22: Arm bike L1.6 min forward/3 min reverse- PT present to monitor and discuss progress Manual: elongation and release to bil neck and upper traps, side glide and rotational mobs to C3-6 Cervical traction: 17# intermittent 60 second hold, 10 second rest x10  12/09/21: Arm bike L1.6 min forward/3 min reverse- PT present to monitor and discuss progress Bicep Curls 4# 2x10 4# 3 was raise 12x Cervical mechanical Traction 18# 10 min 60/20   12/05/21: Arm bike L1.3  min forward/3 min reverse- PTA present to monitor and discuss progress Manual: elongation and release to bil neck and upper traps, side glide and rotational mobs to C3-6 Cervical traction: 17# intermittent 60 second hold, 10 second rest x10    PATIENT EDUCATION:  Education details: Access Code: [email or web address] Person educated: Patient and Parent Education method: Explanation, Demonstration, and Handouts Education comprehension: verbalized understanding and returned demonstration     HOME EXERCISE PROGRAM: Access Code: [email or web address] URL: [email or web address] Date: 10/30/2021 Prepared by: Claiborne Billings   Exercises - Seated Cervical Flexion AROM  - 3 x daily - 7 x weekly - 1 sets - 3 reps - 20 hold - Seated Cervical Sidebending AROM  - 3 x daily - 7 x weekly - 1 sets - 3 reps - 20 hold - Seated Cervical Rotation AROM  - 3 x daily - 7 x weekly - 1 sets - 3 reps - 20 hold - Seated Correct Posture  - 1 x daily - 7 x weekly - 3 sets -  10 reps - Seated Scapular Retraction  - 5 x daily - 7 x weekly - 1 sets - 10 reps - 5 hold   ASSESSMENT:   CLINICAL IMPRESSION: Pt arrives reporting his neck is feeling about the same as when he was here last visit. Pt felt comfortable lifting the 4# for biceps and shoulder 3 ways. Pt had no adverse effects from lifting the 4#. Pt requested keeping 17# on traction. Pt recently underwent nerve conduction testing in his RTLE.   OBJECTIVE IMPAIRMENTS decreased mobility, decreased ROM, decreased strength, hypomobility, increased muscle spasms, impaired flexibility, impaired UE functional use, postural dysfunction, and pain.    ACTIVITY LIMITATIONS lifting, sitting, and reach over head   PARTICIPATION LIMITATIONS: cleaning, laundry, and driving   PERSONAL FACTORS 3+ comorbidities: cervical DDD, CVA, bil LE neuropathy,   are also affecting patient's functional outcome.    REHAB POTENTIAL: Good   CLINICAL DECISION MAKING: Evolving/moderate complexity   EVALUATION COMPLEXITY: Moderate   GOALS: Goals reviewed with patient? Yes   SHORT TERM GOALS: Target date: 11/27/2021    Be independent in initial HEP Baseline:  Goal status: Goal met 11/18/21   2.  Verbalize postural modifications to improve alignment and reduce UE symptoms  Baseline:  Goal status:  Goal met 11/18/21   3.  Demonstrate Rt cervical A/ROM rotation to > or = to 70 degrees to improve safety with driving Baseline: 65 Rt, 70 Lt (11/14/21) Goal status: In progress    4.  Report > or = to 30% reduction in neck pain and UE radiculopathy with ADLs and self-care Baseline: 20%  Goal status: 20% 12/05/21   LONG TERM GOALS: Target date: 02/06/22   Be independent in advanced HEP Baseline:  Goal status: In progress    2.  Improve FOTO to > or = to 52  Baseline: 54 (12/12/21) Goal status: MET   3.  Improve bil grip to > or = to 45# to improve functional use and endurance with ADLs and self-care Baseline: 32/29 Goal status: In  progress   4.  Report > or = to 60% reduction in neck pain and UE pain with ADLs and self-care  Baseline: 23% (12/12/21) Goal status: revised    5.  Demonstrate > or = to 80 degrees A/ROM rotation bilaterally to improve safety with driving  Baseline: 25/63 Goal status: in progress        PLAN: PT FREQUENCY: 1-2x/week   PT DURATION: 8 weeks   PLANNED INTERVENTIONS: Therapeutic exercises, Therapeutic activity, Neuromuscular re-education, Balance training, Gait training, Patient/Family education, Self Care, Joint mobilization, Joint manipulation, Aquatic Therapy, Dry Needling, Electrical stimulation, Spinal manipulation, Spinal mobilization, Cryotherapy, Moist heat, Taping, Traction, Ultrasound, Manual therapy, and Re-evaluation   PLAN FOR NEXT SESSION: continue stretching and postural strength as allowed post-surgery   Myrene Galas, PTA 01/13/22 12:58 PM   [facility] [address] [place], [place] [address] Phone # [phone] Fax [phone]

## 2022-01-13 NOTE — Progress Notes (Signed)
Please allow 2-3 business days to fax official test results.    Gertie Gowda, DO 01/13/2022

## 2022-01-14 ENCOUNTER — Other Ambulatory Visit: Payer: Self-pay | Admitting: Cardiovascular Disease

## 2022-01-14 ENCOUNTER — Other Ambulatory Visit (HOSPITAL_COMMUNITY): Payer: Self-pay

## 2022-01-14 ENCOUNTER — Other Ambulatory Visit (HOSPITAL_BASED_OUTPATIENT_CLINIC_OR_DEPARTMENT_OTHER): Payer: Self-pay

## 2022-01-14 MED ORDER — FUROSEMIDE 20 MG PO TABS
20.0000 mg | ORAL_TABLET | Freq: Every day | ORAL | 1 refills | Status: DC | PRN
  Filled 2022-01-14: qty 30, 30d supply, fill #0
  Filled 2022-04-04: qty 30, 30d supply, fill #1

## 2022-01-16 ENCOUNTER — Ambulatory Visit: Payer: 59

## 2022-01-16 DIAGNOSIS — R252 Cramp and spasm: Secondary | ICD-10-CM

## 2022-01-16 DIAGNOSIS — M542 Cervicalgia: Secondary | ICD-10-CM

## 2022-01-16 DIAGNOSIS — R293 Abnormal posture: Secondary | ICD-10-CM

## 2022-01-16 NOTE — Therapy (Signed)
OUTPATIENT PHYSICAL THERAPY TREATMENT NOTE   Patient Name: Jesse Bowers MRN: [id number] DOB:[birth year], 56 y.o., adult Today's Date: 01/16/2022  PCP: NA-pt reports that he does not currently have a PCP REFERRING PROVIDER: Alger Simons, MD  END OF SESSION:   PT End of Session - 01/16/22 1311     Visit Number 13    Date for PT Re-Evaluation 02/06/22    Authorization Type UMR    PT Start Time 1242    PT Stop Time 1316    PT Time Calculation (min) 34 min    Activity Tolerance Patient tolerated treatment well    Behavior During Therapy WFL for tasks assessed/performed                        Past Medical History:  Diagnosis Date   Abnormal weight loss    Anxiety    Arthritis    Cataract    OU   Celiac disease    Cervical neck pain with evidence of disc disease    patient has a cyst    Chronic constipation    Chronic diastolic heart failure ([place])    Pt. 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   Family history of adverse reaction to anesthesia    family has problems with anesthesia of nausea and vomiting    GERD (gastroesophageal reflux disease)    History of in 20's   Gluten enteropathy    H/O parotitis    right    Hard of hearing    History of kidney stones    History of retinal tear    Bilateral   History of staph infection    required wound vac   Hx-TIA (transient ischemic attack)    2015   Kidney stones 08/2020   LVH (left ventricular hypertrophy) 12/15/2016   Mild, noted on ECHO   MVP (mitral valve prolapse)    NAFL (nonalcoholic fatty liver)    Neck pain    Neuromuscular disorder ([place])    bilateral neuropathy feet.   Nuclear sclerotic cataract of both eyes 09/08/2019   Pneumonia 12/17/2010   Polycythemia     Polycythemia, secondary    PONV (postoperative nausea and vomiting)    Protein C deficiency (HCC)    Dr. Anne Fu   Psoriasis    16 X10 cm psoriatic rash on sole of left foot ; open and occ scant bleeding;    psoriatic arthritis    PTSD (post-traumatic stress disorder)    Scaphoid fracture of wrist 09/23/2013   Seizure ([place])    childhood, medication until age 76 then weanned completely off   Sleep apnea    split night study last done by Dr. [name] 06/18/15 shows severe OSA, CSA, and hypersomnia, rec bipap   Splenomegaly    Stenosis of ureteropelvic junction (UPJ)    left   Stroke [facility])    CVA vs TIA in left cerebrum causing slight right sided weakness-Dr. [name] follows   Syrinx of spinal cord ([place]) 01/06/2014   c spine on MRI   Tachycardia    hx of    Transfusion history    past history- none recent, after surgeries due to blood loss  Transgender with history of gender affirmation surgery    Wears glasses    Wears hearing aid    Past Surgical History:  Procedure Laterality Date   ABDOMINAL HYSTERECTOMY Bilateral 1994   TAH, BSO- tranverse incision at 56 yo   ANKLE ARTHROSCOPY WITH RECONSTRUCTION Right 2007   CHOLECYSTECTOMY     laparoscopic   COLONOSCOPY     x3   EYE SURGERY     Left eye 03/02/2018, right 02/15/2018   HIP ARTHROSCOPY W/ LABRAL REPAIR Right 05/11/2013   acetabular labral tear 03/30/2013   KNEE ARTHROPLASTY Right    KNEE JOINT MANIPULATION Left    x3 under anesthesia   KNEE SURGERY Bilateral 1984   Right ACL, left PCL repair   LITHOTRIPSY  2005   LIVER BIOPSY  2013   normal results.   MASTECTOMY Bilateral    prior to 2009   MOUTH SURGERY     NASAL SEPTUM SURGERY N/A 09/20/2015   by ENT Dr. [name]   OVARIAN CYST SURGERY Left    size of grapefruit, was informed that she had shortened vagina   SHOULDER SURGERY Bilateral    Right 08/15/2016, Left 11/15/2016   THUMB ARTHROSCOPY Left    THYROIDECTOMY, PARTIAL Left 2008   TOTAL KNEE ARTHROPLASTY  Right 08/23/2018   Procedure: TOTAL KNEE ARTHROPLASTY;  Surgeon: [name], MD;  Location: [facility];  Service: Orthopedics;  Laterality: Right;  79mn   TOTAL KNEE REVISION Left 02/06/2016   Procedure: LEFT TOTAL KNEE REVISION;  Surgeon: FGaynelle Arabian MD;  Location: [facility];  Service: Orthopedics;  Laterality: Left;   TOTAL KNEE REVISION Left 04/22/2017   Procedure: Left knee polyethylene revision;  Surgeon: AGaynelle Arabian MD;  Location: [facility];  Service: Orthopedics;  Laterality: Left;   UPPER GI ENDOSCOPY  2003   Patient Active Problem List   Diagnosis Date Noted   Posterior vitreous detachment of right eye 11/18/2021   Spondylosis of lumbar spine 10/03/2020   Pseudophakia of both eyes 05/07/2020   Retinal telangiectasis of both eyes 05/07/2020   Lattice degeneration of peripheral retina, left 09/08/2019   Lattice degeneration, right eye 09/08/2019   Chronic diastolic heart failure ([place] 05/24/2019   Urinary dysfunction 04/12/2019   Osteoarthritis of right knee 08/23/2018   Constipation due to opioid therapy 03/30/2018   SNHL (sensorineural hearing loss) 12/04/2017   Abnormal urinary stream 12/03/2017   Osteoarthritis of carpometacarpal (CMC) joint of thumb 11/30/2017   Muscle weakness 11/17/2017   Transient vision disturbance 11/12/2017   Bilateral hand pain 10/30/2017   Pain of left hip joint 10/09/2017   Gynecomastia 07/10/2017   Iron deficiency anemia 07/05/2017   Spasticity 05/20/2017   Lumbar radiculitis 04/20/2017   Ulnar 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, left 11/28/2016   Idiopathic peripheral neuropathy 11/28/2016   Failed total knee arthroplasty, 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   Long term (current) use of anticoagulants 08/23/2015   Right upper quadrant abdominal pain 08/23/2015   Memory loss 05/10/2015   Gait abnormality 04/07/2015   Alkaline phosphatase elevation 04/07/2015   History of thrombosis 03/26/2015   Medial epicondylitis 02/07/2015   Cognitive decline 12/21/2014   Leukopenia  12/05/2014   Rotator cuff syndrome of right shoulder 10/27/2014   Status post left knee replacement 08/22/2014   Left lateral epicondylitis 08/22/2014   Chronic cerebral ischemia 08/18/2014   Arthrofibrosis of knee joint 08/17/2014   Cubital canal compression syndrome, left 08/17/2014   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 04/10/2014   Chronic pain syndrome 04/10/2014   Insomnia 04/10/2014   Chronic  non-specific white matter lesions on MRI 04/10/2014   CFS (chronic fatigue syndrome) 04/10/2014   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[place]) 03/01/2014   Biceps tendonitis on left 03/01/2014   Polycythemia vera ([place]) 12/27/2013   H/O TIA (transient ischemic attack) and stroke 12/27/2013   Neck pain 12/27/2013   OSA (obstructive sleep apnea) 12/08/2013   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/31/2013   Depression with anxiety 08/04/2013   Protein C deficiency ([place]) 08/01/2013   Post traumatic stress disorder (PTSD) 08/01/2013   Speech abnormality 07/25/2013   Obesity 05/10/2013   Lower extremity edema 05/10/2013   GERD (gastroesophageal reflux disease) 05/10/2013   Arthritis 05/10/2013   OA (osteoarthritis) of knee 03/15/2013   Headache 10/25/2012   Palpitations 10/18/2012   Fatty liver determined by biopsy 06/01/2012   Arthropathic psoriasis, unspecified ([place]) 04/29/2012   Abnormal liver enzymes 03/29/2012   Psoriatic arthritis ([place]) 12/29/2011   Left Renal Hydronephrosis 12/11/2010   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     THERAPY DIAG:  Abnormal posture  Cervicalgia  Cramp and spasm  Rationale for Evaluation and Treatment Rehabilitation  PERTINENT HISTORY: Lumbar stenosis L5-S1, failed TKA 2019, neuropathy in bil feet, PTSD, CVA vs TIA Rt weakness, head trauma 2019, Rt thumb  replacement, bil total knee replacements, bil shoulder scope surgeries  PRECAUTIONS: Fall  SUBJECTIVE: Neck is about the same as last visit. I want to try the arm lifts today.   PAIN:  Are you having pain? Neck is just sore not not pain/ NPRS scale: 5-6/10 Pain location: neck Lt>Rt PAIN TYPE: aching Pain description: intermittent and aching  Aggravating factors: nothing specific, at the end of the day from fatigue Relieving factors: traction, rest   OBJECTIVE: (objective measures completed at initial evaluation unless otherwise dated)  DIAGNOSTIC FINDINGS:  MRI on 10/24/21: No interval change of the syrinx (fluid filled cyst) spanning the C4 through C6 levels measuring up to 4 mm in diameter. 2. Mild degenerative changes of the cervical spine without high-grade spinal canal stenosis at any level. 3. Mild to moderate left neural foraminal narrowing at C3-4, unchanged.   PATIENT SURVEYS:  FOTO 36 (52 is goal) 12/12/21: 54-goal met   COGNITION: Overall cognitive status: Within functional limits for tasks assessed SENSATION: WFL   POSTURE: rounded shoulders and forward head   PALPATION: Trigger points in bil suboccipitals, upper traps and tension in bil cervical paraspinals.  Pt with reduced segmental mobility C3-7 and T1-5.  Pain reduction with manual cervical traction in supine.              CERVICAL ROM:    Active ROM A/PROM (deg) eval A/ROM 11/11/21 A/ROM 12/12/21  Flexion 50    Extension 30    Right lateral flexion 35    Left lateral flexion 40    Right rotation 60 65 70  Left rotation 70 80 75   (Blank rows = not tested)   UPPER EXTREMITY ROM:   Bil UE A/ROM limited by 25% in all directions.    UPPER EXTREMITY MMT:   MMT Right eval Left eval  Shoulder flexion 4-/5 4/5  Shoulder extension 4- 4  Shoulder abduction 4- 4-  Shoulder adduction      Shoulder extension      Shoulder internal rotation 4+ 4+  Shoulder external rotation 4- 4-  Middle  trapezius      Lower trapezius      Elbow flexion      Elbow extension      Wrist flexion      Wrist extension      Wrist ulnar deviation      Wrist radial deviation      Wrist pronation      Wrist supination      Grip strength 32 29   (Blank rows = not tested)   TODAY'S TREATMENT:  01/16/22: Arm bike; L1.6 4x4 with PTA present to monitor and discuss current status.  Bicep Curls 4# 2x10 Cervical traction: 17# intermittent 60 second hold, 10 second rest x10 4# 3 was raise below 90 degrees 12x  01/13/22: Arm bike; L1.6 4x4 with PTA present to monitor and discuss current status.  Bicep Curls 4# 2x10 Cervical traction: 17# intermittent 60 second hold, 10 second rest x10 4# 3 was raise below 90 degrees F12x  01/01/22: Arm bike L1.6 min forward/3 min reverse- PT present to monitor and discuss progress Manual: elongation and release to bil neck and upper traps, side glide and rotational mobs to C3-6 Cervical traction: 17# intermittent 60 second hold, 10 second rest x10    PATIENT EDUCATION:  Education details: Access Code: [email or web address] Person educated: Patient and Parent Education method: Explanation, Demonstration, and Handouts Education comprehension: verbalized understanding and returned demonstration     HOME EXERCISE PROGRAM: Access Code: [email or web address] URL: [email or web address] Date: 10/30/2021 Prepared by: Claiborne Billings   Exercises - Seated Cervical Flexion AROM  - 3 x daily - 7 x weekly - 1 sets - 3 reps - 20 hold - Seated Cervical Sidebending AROM  - 3 x daily - 7 x weekly - 1 sets - 3 reps - 20 hold - Seated Cervical Rotation AROM  - 3 x daily - 7 x weekly - 1 sets - 3 reps - 20 hold - Seated Correct Posture  - 1 x daily - 7 x weekly - 3 sets - 10 reps - Seated Scapular Retraction  - 5 x daily - 7 x weekly - 1 sets - 10 reps - 5 hold   ASSESSMENT:   CLINICAL IMPRESSION: Pt continues to recover from recent surgery.  Pt reports 5/10 neck pain with activity.  PT  printed referral from MD for cervical traction for the pt to pursue for home.  Pt continues to require verbal and tactile cues for posture with seated exercise.  He responds well to cervical traction.  Patient will benefit from skilled PT to address the below impairments and improve overall function.  OBJECTIVE IMPAIRMENTS decreased mobility, decreased ROM, decreased strength, hypomobility, increased muscle spasms, impaired flexibility, impaired UE functional use, postural dysfunction, and pain.    ACTIVITY LIMITATIONS lifting, sitting, and reach over head   PARTICIPATION LIMITATIONS: cleaning, laundry, and driving   PERSONAL FACTORS 3+ comorbidities: cervical DDD, CVA, bil LE neuropathy,   are also affecting patient's functional outcome.    REHAB POTENTIAL: Good   CLINICAL DECISION MAKING: Evolving/moderate complexity   EVALUATION COMPLEXITY: Moderate   GOALS: Goals reviewed with patient? Yes   SHORT TERM GOALS: Target date: 11/27/2021    Be independent in initial HEP Baseline:  Goal status: Goal met 11/18/21   2.  Verbalize postural modifications to improve alignment and reduce UE symptoms  Baseline:  Goal status: Goal met 11/18/21   3.  Demonstrate Rt cervical A/ROM rotation to > or = to 70 degrees to improve safety with driving Baseline: 65 Rt,  70 Lt (11/14/21) Goal status: In progress    4.  Report > or = to 30% reduction in neck pain and UE radiculopathy with ADLs and self-care Baseline: 20%  Goal status: 20% 12/05/21   LONG TERM GOALS: Target date: 02/06/22   Be independent in advanced HEP Baseline:  Goal status: In progress    2.  Improve FOTO to > or = to 52  Baseline: 54 (12/12/21) Goal status: MET   3.  Improve bil grip to > or = to 45# to improve functional use and endurance with ADLs and self-care Baseline: 32/29 Goal status: In progress   4.  Report > or = to 60% reduction in neck pain and UE pain with ADLs and self-care  Baseline: 23% (12/12/21) Goal  status: revised    5.  Demonstrate > or = to 80 degrees A/ROM rotation bilaterally to improve safety with driving  Baseline: 25/48 Goal status: in progress        PLAN: PT FREQUENCY: 1-2x/week   PT DURATION: 8 weeks   PLANNED INTERVENTIONS: Therapeutic exercises, Therapeutic activity, Neuromuscular re-education, Balance training, Gait training, Patient/Family education, Self Care, Joint mobilization, Joint manipulation, Aquatic Therapy, Dry Needling, Electrical stimulation, Spinal manipulation, Spinal mobilization, Cryotherapy, Moist heat, Taping, Traction, Ultrasound, Manual therapy, and Re-evaluation   PLAN FOR NEXT SESSION: continue stretching and postural strength as allowed post-surgery.   Sigurd Sos, PT 01/16/22 1:12 PM   [facility] [address], [place], [place] [address] Phone # [phone] Fax [phone]

## 2022-01-19 NOTE — Therapy (Unsigned)
OUTPATIENT PHYSICAL THERAPY TREATMENT NOTE   Patient Name: Jesse Bowers MRN: [id number] DOB:[birth year], 56 y.o., adult Today's Date: 01/20/2022  PCP: NA-pt reports that he does not currently have a PCP REFERRING PROVIDER: Alger Simons, MD  END OF SESSION:   PT End of Session - 01/20/22 1240     Visit Number 14    Date for PT Re-Evaluation 02/06/22    Authorization Type UMR    PT Start Time 1234    PT Stop Time 1315    PT Time Calculation (min) 41 min    Activity Tolerance Patient tolerated treatment well    Behavior During Therapy WFL for tasks assessed/performed                         Past Medical History:  Diagnosis Date   Abnormal weight loss    Anxiety    Arthritis    Cataract    OU   Celiac disease    Cervical neck pain with evidence of disc disease    patient has a cyst    Chronic constipation    Chronic diastolic heart failure ([place])    Pt. 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   Family history of adverse reaction to anesthesia    family has problems with anesthesia of nausea and vomiting    GERD (gastroesophageal reflux disease)    History of in 20's   Gluten enteropathy    H/O parotitis    right    Hard of hearing    History of kidney stones    History of retinal tear    Bilateral   History of staph infection    required wound vac   Hx-TIA (transient ischemic attack)    2015   Kidney stones 08/2020   LVH (left ventricular hypertrophy) 12/15/2016   Mild, noted on ECHO   MVP (mitral valve prolapse)    NAFL (nonalcoholic fatty liver)    Neck pain    Neuromuscular disorder ([place])    bilateral neuropathy feet.   Nuclear sclerotic cataract of both eyes 09/08/2019   Pneumonia 12/17/2010   Polycythemia     Polycythemia, secondary    PONV (postoperative nausea and vomiting)    Protein C deficiency (HCC)    Dr. Anne Fu   Psoriasis    16 X10 cm psoriatic rash on sole of left foot ; open and occ scant bleeding;    psoriatic arthritis    PTSD (post-traumatic stress disorder)    Scaphoid fracture of wrist 09/23/2013   Seizure ([place])    childhood, medication until age 40 then weanned completely off   Sleep apnea    split night study last done by Dr. [name] 06/18/15 shows severe OSA, CSA, and hypersomnia, rec bipap   Splenomegaly    Stenosis of ureteropelvic junction (UPJ)    left   Stroke [facility])    CVA vs TIA in left cerebrum causing slight right sided weakness-Dr. 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) 01/06/2014   c spine on MRI   Tachycardia    hx of    Transfusion history    past history- none recent, after surgeries due to blood  loss   Transgender with history of gender affirmation surgery    Wears glasses    Wears hearing aid    Past Surgical History:  Procedure Laterality Date   ABDOMINAL HYSTERECTOMY Bilateral 1994   TAH, BSO- tranverse incision at 56 yo   ANKLE ARTHROSCOPY WITH RECONSTRUCTION Right 2007   CHOLECYSTECTOMY     laparoscopic   COLONOSCOPY     x3   EYE SURGERY     Left eye 03/02/2018, right 02/15/2018   HIP ARTHROSCOPY W/ LABRAL REPAIR Right 05/11/2013   acetabular labral tear 03/30/2013   KNEE ARTHROPLASTY Right    KNEE JOINT MANIPULATION Left    x3 under anesthesia   KNEE SURGERY Bilateral 1984   Right ACL, left PCL repair   LITHOTRIPSY  2005   LIVER BIOPSY  2013   normal results.   MASTECTOMY Bilateral    prior to 2009   MOUTH SURGERY     NASAL SEPTUM SURGERY N/A 09/20/2015   by ENT Dr. [name]   OVARIAN CYST SURGERY Left    size of grapefruit, was informed that she had shortened vagina   SHOULDER SURGERY Bilateral    Right 08/15/2016, Left 11/15/2016   THUMB ARTHROSCOPY Left    THYROIDECTOMY, PARTIAL Left 2008   TOTAL KNEE ARTHROPLASTY  Right 08/23/2018   Procedure: TOTAL KNEE ARTHROPLASTY;  Surgeon: [name], MD;  Location: [facility];  Service: Orthopedics;  Laterality: Right;  62mn   TOTAL KNEE REVISION Left 02/06/2016   Procedure: LEFT TOTAL KNEE REVISION;  Surgeon: FGaynelle Arabian MD;  Location: [facility];  Service: Orthopedics;  Laterality: Left;   TOTAL KNEE REVISION Left 04/22/2017   Procedure: Left knee polyethylene revision;  Surgeon: AGaynelle Arabian MD;  Location: [facility];  Service: Orthopedics;  Laterality: Left;   UPPER GI ENDOSCOPY  2003   Patient Active Problem List   Diagnosis Date Noted   Posterior vitreous detachment of right eye 11/18/2021   Spondylosis of lumbar spine 10/03/2020   Pseudophakia of both eyes 05/07/2020   Retinal telangiectasis of both eyes 05/07/2020   Lattice degeneration of peripheral retina, left 09/08/2019   Lattice degeneration, right eye 09/08/2019   Chronic diastolic heart failure ([place] 05/24/2019   Urinary dysfunction 04/12/2019   Osteoarthritis of right knee 08/23/2018   Constipation due to opioid therapy 03/30/2018   SNHL (sensorineural hearing loss) 12/04/2017   Abnormal urinary stream 12/03/2017   Osteoarthritis of carpometacarpal (CMC) joint of thumb 11/30/2017   Muscle weakness 11/17/2017   Transient vision disturbance 11/12/2017   Bilateral hand pain 10/30/2017   Pain of left hip joint 10/09/2017   Gynecomastia 07/10/2017   Iron deficiency anemia 07/05/2017   Spasticity 05/20/2017   Lumbar radiculitis 04/20/2017   Ulnar 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, left 11/28/2016   Idiopathic peripheral neuropathy 11/28/2016   Failed total knee arthroplasty, 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   Long term (current) use of anticoagulants 08/23/2015   Right upper quadrant abdominal pain 08/23/2015   Memory loss 05/10/2015   Gait abnormality 04/07/2015   Alkaline phosphatase elevation 04/07/2015   History of thrombosis 03/26/2015   Medial epicondylitis 02/07/2015   Cognitive decline 12/21/2014   Leukopenia  12/05/2014   Rotator cuff syndrome of right shoulder 10/27/2014   Status post left knee replacement 08/22/2014   Left lateral epicondylitis 08/22/2014   Chronic cerebral ischemia 08/18/2014   Arthrofibrosis of knee joint 08/17/2014   Cubital canal compression syndrome, left 08/17/2014   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[place] [facility] 04/10/2014   Chronic pain syndrome 04/10/2014   Insomnia 04/10/2014  Chronic non-specific white matter lesions on MRI 04/10/2014   CFS (chronic fatigue syndrome) 04/10/2014   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[place]) 03/01/2014   Biceps tendonitis on left 03/01/2014   Polycythemia vera ([place]) 12/27/2013   H/O TIA (transient ischemic attack) and stroke 12/27/2013   Neck pain 12/27/2013   OSA (obstructive sleep apnea) 12/08/2013   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/31/2013   Depression with anxiety 08/04/2013   Protein C deficiency ([place]) 08/01/2013   Post traumatic stress disorder (PTSD) 08/01/2013   Speech abnormality 07/25/2013   Obesity 05/10/2013   Lower extremity edema 05/10/2013   GERD (gastroesophageal reflux disease) 05/10/2013   Arthritis 05/10/2013   OA (osteoarthritis) of knee 03/15/2013   Headache 10/25/2012   Palpitations 10/18/2012   Fatty liver determined by biopsy 06/01/2012   Arthropathic psoriasis, unspecified ([place]) 04/29/2012   Abnormal liver enzymes 03/29/2012   Psoriatic arthritis ([place]) 12/29/2011   Left Renal Hydronephrosis 12/11/2010   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     THERAPY DIAG:  Abnormal posture  Cervicalgia  Cramp and spasm  Rationale for Evaluation and Treatment Rehabilitation  PERTINENT HISTORY: Lumbar stenosis L5-S1, failed TKA 2019, neuropathy in bil feet, PTSD, CVA vs TIA Rt weakness, head trauma 2019, Rt thumb  replacement, bil total knee replacements, bil shoulder scope surgeries  PRECAUTIONS: Fall  SUBJECTIVE: Doing well, nothing new.  PAIN:  Are you having pain? Neck is just sore not not pain/ NPRS scale: 5-6/10 Pain location: neck Lt>Rt PAIN TYPE: aching Pain description: intermittent and aching  Aggravating factors: nothing specific, at the end of the day from fatigue Relieving factors: traction, rest   OBJECTIVE: (objective measures completed at initial evaluation unless otherwise dated)  DIAGNOSTIC FINDINGS:  MRI on 10/24/21: No interval change of the syrinx (fluid filled cyst) spanning the C4 through C6 levels measuring up to 4 mm in diameter. 2. Mild degenerative changes of the cervical spine without high-grade spinal canal stenosis at any level. 3. Mild to moderate left neural foraminal narrowing at C3-4, unchanged.   PATIENT SURVEYS:  FOTO 36 (52 is goal) 12/12/21: 54-goal met   COGNITION: Overall cognitive status: Within functional limits for tasks assessed SENSATION: WFL   POSTURE: rounded shoulders and forward head   PALPATION: Trigger points in bil suboccipitals, upper traps and tension in bil cervical paraspinals.  Pt with reduced segmental mobility C3-7 and T1-5.  Pain reduction with manual cervical traction in supine.              CERVICAL ROM:    Active ROM A/PROM (deg) eval A/ROM 11/11/21 A/ROM 12/12/21  Flexion 50    Extension 30    Right lateral flexion 35    Left lateral flexion 40    Right rotation 60 65 70  Left rotation 70 80 75   (Blank rows = not tested)   UPPER EXTREMITY ROM:   Bil UE A/ROM limited by 25% in all directions.    UPPER EXTREMITY MMT:   MMT Right eval Left eval  Shoulder flexion 4-/5 4/5  Shoulder extension 4- 4  Shoulder abduction 4- 4-  Shoulder adduction      Shoulder extension      Shoulder internal rotation 4+ 4+  Shoulder external rotation 4- 4-  Middle trapezius      Lower trapezius  Elbow flexion       Elbow extension      Wrist flexion      Wrist extension      Wrist ulnar deviation      Wrist radial deviation      Wrist pronation      Wrist supination      Grip strength 32 29   (Blank rows = not tested)   TODAY'S TREATMENT:   01/20/22: Arm bike; L1.6 4x4 with PTA present to monitor and discuss current status.  Bicep Curls 5# 2x10 Cervical traction: 18# intermittent 60 second hold, 10 second rest x10 4# 3 was raise below 90 degrees 12x  01/16/22: Arm bike; L1.6 4x4 with PTA present to monitor and discuss current status.  Bicep Curls 4# 2x10 Cervical traction: 17# intermittent 60 second hold, 10 second rest x10 4# 3 was raise below 90 degrees 12x  01/13/22: Arm bike; L1.6 4x4 with PTA present to monitor and discuss current status.  Bicep Curls 4# 2x10 Cervical traction: 17# intermittent 60 second hold, 10 second rest x10 4# 3 was raise below 90 degrees F12x  01/01/22: Arm bike L1.6 min forward/3 min reverse- PT present to monitor and discuss progress Manual: elongation and release to bil neck and upper traps, side glide and rotational mobs to C3-6 Cervical traction: 17# intermittent 60 second hold, 10 second rest x10    PATIENT EDUCATION:  Education details: Access Code: [email or web address] Person educated: Patient and Parent Education method: Explanation, Demonstration, and Handouts Education comprehension: verbalized understanding and returned demonstration     HOME EXERCISE PROGRAM: Access Code: [email or web address] URL: [email or web address] Date: 10/30/2021 Prepared by: Claiborne Billings   Exercises - Seated Cervical Flexion AROM  - 3 x daily - 7 x weekly - 1 sets - 3 reps - 20 hold - Seated Cervical Sidebending AROM  - 3 x daily - 7 x weekly - 1 sets - 3 reps - 20 hold - Seated Cervical Rotation AROM  - 3 x daily - 7 x weekly - 1 sets - 3 reps - 20 hold - Seated Correct Posture  - 1 x daily - 7 x weekly - 3 sets - 10 reps - Seated Scapular Retraction  - 5 x daily - 7 x  weekly - 1 sets - 10 reps - 5 hold   ASSESSMENT:   CLINICAL IMPRESSION: Pt continues to recover from recent surgery.  Pt reports 5/10 neck pain with activity.  Pt has not yet purchased his home cervical unit.  Pt continues to require verbal and tactile cues for posture with seated exercise.  He responds well to cervical traction, increasing to 18# today.  Patient will benefit from skilled PT to address the below impairments and improve overall function.  OBJECTIVE IMPAIRMENTS decreased mobility, decreased ROM, decreased strength, hypomobility, increased muscle spasms, impaired flexibility, impaired UE functional use, postural dysfunction, and pain.    ACTIVITY LIMITATIONS lifting, sitting, and reach over head   PARTICIPATION LIMITATIONS: cleaning, laundry, and driving   PERSONAL FACTORS 3+ comorbidities: cervical DDD, CVA, bil LE neuropathy,   are also affecting patient's functional outcome.    REHAB POTENTIAL: Good   CLINICAL DECISION MAKING: Evolving/moderate complexity   EVALUATION COMPLEXITY: Moderate   GOALS: Goals reviewed with patient? Yes   SHORT TERM GOALS: Target date: 11/27/2021    Be independent in initial HEP Baseline:  Goal status: Goal met 11/18/21   2.  Verbalize postural modifications to improve alignment and reduce UE symptoms  Baseline:  Goal status: Goal  met 11/18/21   3.  Demonstrate Rt cervical A/ROM rotation to > or = to 70 degrees to improve safety with driving Baseline: 65 Rt, 70 Lt (11/14/21) Goal status: In progress    4.  Report > or = to 30% reduction in neck pain and UE radiculopathy with ADLs and self-care Baseline: 20%  Goal status: 20% 12/05/21   LONG TERM GOALS: Target date: 02/06/22   Be independent in advanced HEP Baseline:  Goal status: In progress    2.  Improve FOTO to > or = to 52  Baseline: 54 (12/12/21) Goal status: MET   3.  Improve bil grip to > or = to 45# to improve functional use and endurance with ADLs and  self-care Baseline: 32/29 Goal status: In progress   4.  Report > or = to 60% reduction in neck pain and UE pain with ADLs and self-care  Baseline: 23% (12/12/21) Goal status: revised    5.  Demonstrate > or = to 80 degrees A/ROM rotation bilaterally to improve safety with driving  Baseline: 17/51 Goal status: in progress        PLAN: PT FREQUENCY: 1-2x/week   PT DURATION: 8 weeks   PLANNED INTERVENTIONS: Therapeutic exercises, Therapeutic activity, Neuromuscular re-education, Balance training, Gait training, Patient/Family education, Self Care, Joint mobilization, Joint manipulation, Aquatic Therapy, Dry Needling, Electrical stimulation, Spinal manipulation, Spinal mobilization, Cryotherapy, Moist heat, Taping, Traction, Ultrasound, Manual therapy, and Re-evaluation   PLAN FOR NEXT SESSION: continue stretching and postural strength as allowed post-surgery.  Myrene Galas, PTA 01/20/22 1:00 PM   [facility] [address] [place], [place] [address] Phone # [phone] Fax [phone]

## 2022-01-20 ENCOUNTER — Encounter: Payer: Self-pay | Admitting: Physical Therapy

## 2022-01-20 ENCOUNTER — Ambulatory Visit: Payer: 59 | Admitting: Physical Therapy

## 2022-01-20 DIAGNOSIS — R252 Cramp and spasm: Secondary | ICD-10-CM

## 2022-01-20 DIAGNOSIS — R293 Abnormal posture: Secondary | ICD-10-CM

## 2022-01-20 DIAGNOSIS — M542 Cervicalgia: Secondary | ICD-10-CM

## 2022-01-26 NOTE — Therapy (Signed)
OUTPATIENT PHYSICAL THERAPY TREATMENT NOTE   Patient Name: Jesse Bowers MRN: [id number] DOB:[birth year], 56 y.o., adult Today's Date: 01/27/2022  PCP: NA-pt reports that he does not currently have a PCP REFERRING PROVIDER: Alger Simons, MD  END OF SESSION:   PT End of Session - 01/27/22 1240     Visit Number 15    Date for PT Re-Evaluation 02/06/22    Authorization Type UMR    PT Start Time 1238   Pt late   PT Stop Time 1315    PT Time Calculation (min) 37 min    Activity Tolerance Patient tolerated treatment well    Behavior During Therapy WFL for tasks assessed/performed                          Past Medical History:  Diagnosis Date   Abnormal weight loss    Anxiety    Arthritis    Cataract    OU   Celiac disease    Cervical neck pain with evidence of disc disease    patient has a cyst    Chronic constipation    Chronic diastolic heart failure ([place])    Pt. 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   Family history of adverse reaction to anesthesia    family has problems with anesthesia of nausea and vomiting    GERD (gastroesophageal reflux disease)    History of in 20's   Gluten enteropathy    H/O parotitis    right    Hard of hearing    History of kidney stones    History of retinal tear    Bilateral   History of staph infection    required wound vac   Hx-TIA (transient ischemic attack)    2015   Kidney stones 08/2020   LVH (left ventricular hypertrophy) 12/15/2016   Mild, noted on ECHO   MVP (mitral valve prolapse)    NAFL (nonalcoholic fatty liver)    Neck pain    Neuromuscular disorder ([place])    bilateral neuropathy feet.   Nuclear sclerotic cataract of both eyes 09/08/2019   Pneumonia 12/17/2010    Polycythemia    Polycythemia, secondary    PONV (postoperative nausea and vomiting)    Protein C deficiency (HCC)    Dr. Anne Fu   Psoriasis    16 X10 cm psoriatic rash on sole of left foot ; open and occ scant bleeding;    psoriatic arthritis    PTSD (post-traumatic stress disorder)    Scaphoid fracture of wrist 09/23/2013   Seizure ([place])    childhood, medication until age 69 then weanned completely off   Sleep apnea    split night study last done by Dr. [name] 06/18/15 shows severe OSA, CSA, and hypersomnia, rec bipap   Splenomegaly    Stenosis of ureteropelvic junction (UPJ)    left   Stroke [facility])    CVA vs TIA in left cerebrum causing slight right sided weakness-Dr. [name] follows   Syrinx of spinal cord ([place]) 01/06/2014   c spine on MRI   Tachycardia    hx of    Transfusion history    past history- none recent, after  surgeries due to blood loss   Transgender with history of gender affirmation surgery    Wears glasses    Wears hearing aid    Past Surgical History:  Procedure Laterality Date   ABDOMINAL HYSTERECTOMY Bilateral 1994   TAH, BSO- tranverse incision at 56 yo   ANKLE ARTHROSCOPY WITH RECONSTRUCTION Right 2007   CHOLECYSTECTOMY     laparoscopic   COLONOSCOPY     x3   EYE SURGERY     Left eye 03/02/2018, right 02/15/2018   HIP ARTHROSCOPY W/ LABRAL REPAIR Right 05/11/2013   acetabular labral tear 03/30/2013   KNEE ARTHROPLASTY Right    KNEE JOINT MANIPULATION Left    x3 under anesthesia   KNEE SURGERY Bilateral 1984   Right ACL, left PCL repair   LITHOTRIPSY  2005   LIVER BIOPSY  2013   normal results.   MASTECTOMY Bilateral    prior to 2009   MOUTH SURGERY     NASAL SEPTUM SURGERY N/A 09/20/2015   by ENT Dr. [name]   OVARIAN CYST SURGERY Left    size of grapefruit, was informed that she had shortened vagina   SHOULDER SURGERY Bilateral    Right 08/15/2016, Left 11/15/2016   THUMB ARTHROSCOPY Left    THYROIDECTOMY, PARTIAL Left 2008   TOTAL KNEE  ARTHROPLASTY Right 08/23/2018   Procedure: TOTAL KNEE ARTHROPLASTY;  Surgeon: [name], MD;  Location: [facility];  Service: Orthopedics;  Laterality: Right;  38mn   TOTAL KNEE REVISION Left 02/06/2016   Procedure: LEFT TOTAL KNEE REVISION;  Surgeon: FGaynelle Arabian MD;  Location: [facility];  Service: Orthopedics;  Laterality: Left;   TOTAL KNEE REVISION Left 04/22/2017   Procedure: Left knee polyethylene revision;  Surgeon: AGaynelle Arabian MD;  Location: [facility];  Service: Orthopedics;  Laterality: Left;   UPPER GI ENDOSCOPY  2003   Patient Active Problem List   Diagnosis Date Noted   Posterior vitreous detachment of right eye 11/18/2021   Spondylosis of lumbar spine 10/03/2020   Pseudophakia of both eyes 05/07/2020   Retinal telangiectasis of both eyes 05/07/2020   Lattice degeneration of peripheral retina, left 09/08/2019   Lattice degeneration, right eye 09/08/2019   Chronic diastolic heart failure ([place] 05/24/2019   Urinary dysfunction 04/12/2019   Osteoarthritis of right knee 08/23/2018   Constipation due to opioid therapy 03/30/2018   SNHL (sensorineural hearing loss) 12/04/2017   Abnormal urinary stream 12/03/2017   Osteoarthritis of carpometacarpal (CMC) joint of thumb 11/30/2017   Muscle weakness 11/17/2017   Transient vision disturbance 11/12/2017   Bilateral hand pain 10/30/2017   Pain of left hip joint 10/09/2017   Gynecomastia 07/10/2017   Iron deficiency anemia 07/05/2017   Spasticity 05/20/2017   Lumbar radiculitis 04/20/2017   Ulnar 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, left 11/28/2016   Idiopathic peripheral neuropathy 11/28/2016   Failed total knee arthroplasty, 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   Long term (current) use of anticoagulants 08/23/2015   Right upper quadrant abdominal pain 08/23/2015   Memory loss 05/10/2015   Gait abnormality 04/07/2015   Alkaline phosphatase elevation 04/07/2015   History of thrombosis 03/26/2015   Medial epicondylitis 02/07/2015   Cognitive decline 12/21/2014    Leukopenia 12/05/2014   Rotator cuff syndrome of right shoulder 10/27/2014   Status post left knee replacement 08/22/2014   Left lateral epicondylitis 08/22/2014   Chronic cerebral ischemia 08/18/2014   Arthrofibrosis of knee joint 08/17/2014   Cubital canal compression syndrome, left 08/17/2014   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 04/10/2014   Chronic pain syndrome 04/10/2014  Insomnia 04/10/2014   Chronic non-specific white matter lesions on MRI 04/10/2014   CFS (chronic fatigue syndrome) 04/10/2014   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[place]) 03/01/2014   Biceps tendonitis on left 03/01/2014   Polycythemia vera ([place]) 12/27/2013   H/O TIA (transient ischemic attack) and stroke 12/27/2013   Neck pain 12/27/2013   OSA (obstructive sleep apnea) 12/08/2013   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/31/2013   Depression with anxiety 08/04/2013   Protein C deficiency ([place]) 08/01/2013   Post traumatic stress disorder (PTSD) 08/01/2013   Speech abnormality 07/25/2013   Obesity 05/10/2013   Lower extremity edema 05/10/2013   GERD (gastroesophageal reflux disease) 05/10/2013   Arthritis 05/10/2013   OA (osteoarthritis) of knee 03/15/2013   Headache 10/25/2012   Palpitations 10/18/2012   Fatty liver determined by biopsy 06/01/2012   Arthropathic psoriasis, unspecified ([place]) 04/29/2012   Abnormal liver enzymes 03/29/2012   Psoriatic arthritis ([place]) 12/29/2011   Left Renal Hydronephrosis 12/11/2010   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     THERAPY DIAG:  Abnormal posture  Cervicalgia  Cramp and spasm  Rationale for Evaluation and Treatment Rehabilitation  PERTINENT HISTORY: Lumbar stenosis L5-S1, failed TKA 2019, neuropathy in bil feet, PTSD, CVA vs TIA Rt weakness, head trauma 2019,  Rt thumb replacement, bil total knee replacements, bil shoulder scope surgeries  PRECAUTIONS: Fall  SUBJECTIVE: Doing well, nothing new.  PAIN:  Are you having pain? Neck is just sore not not pain/ NPRS scale: 2-3/10 Pain location: neck Lt>Rt PAIN TYPE: aching Pain description: intermittent and aching  Aggravating factors: nothing specific, at the end of the day from fatigue Relieving factors: traction, rest   OBJECTIVE: (objective measures completed at initial evaluation unless otherwise dated)  DIAGNOSTIC FINDINGS:  MRI on 10/24/21: No interval change of the syrinx (fluid filled cyst) spanning the C4 through C6 levels measuring up to 4 mm in diameter. 2. Mild degenerative changes of the cervical spine without high-grade spinal canal stenosis at any level. 3. Mild to moderate left neural foraminal narrowing at C3-4, unchanged.   PATIENT SURVEYS:  FOTO 36 (52 is goal) 12/12/21: 54-goal met   COGNITION: Overall cognitive status: Within functional limits for tasks assessed SENSATION: WFL   POSTURE: rounded shoulders and forward head   PALPATION: Trigger points in bil suboccipitals, upper traps and tension in bil cervical paraspinals.  Pt with reduced segmental mobility C3-7 and T1-5.  Pain reduction with manual cervical traction in supine.              CERVICAL ROM:    Active ROM A/PROM (deg) eval A/ROM 11/11/21 A/ROM 12/12/21  Flexion 50    Extension 30    Right lateral flexion 35    Left lateral flexion 40    Right rotation 60 65 70  Left rotation 70 80 75   (Blank rows = not tested)   UPPER EXTREMITY ROM:   Bil UE A/ROM limited by 25% in all directions.    UPPER EXTREMITY MMT:   MMT Right eval Left eval  Shoulder flexion 4-/5 4/5  Shoulder extension 4- 4  Shoulder abduction 4- 4-  Shoulder adduction      Shoulder extension      Shoulder internal rotation 4+ 4+  Shoulder external rotation 4- 4-  Middle trapezius      Lower trapezius  Elbow  flexion      Elbow extension      Wrist flexion      Wrist extension      Wrist ulnar deviation      Wrist radial deviation      Wrist pronation      Wrist supination      Grip strength 32 29   (Blank rows = not tested)   TODAY'S TREATMENT:   01/27/22: Arm bike; L1.7 4x4 with PTA present to monitor and discuss current status.  Bicep Curls 6# 2x10 Cervical traction: 18# intermittent 60 second hold, 10 second rest x10 4# 3 was raise below 90 degrees 12x  01/20/22: Arm bike; L1.6 4x4 with PTA present to monitor and discuss current status.  Bicep Curls 5# 2x10 Cervical traction: 18# intermittent 60 second hold, 10 second rest x10 4# 3 was raise below 90 degrees 12x  01/16/22: Arm bike; L1.6 4x4 with PTA present to monitor and discuss current status.  Bicep Curls 4# 2x10 Cervical traction: 17# intermittent 60 second hold, 10 second rest x10 4# 3 was raise below 90 degrees 12x    PATIENT EDUCATION:  Education details: Access Code: [email or web address] Person educated: Patient and Parent Education method: Explanation, Demonstration, and Handouts Education comprehension: verbalized understanding and returned demonstration     HOME EXERCISE PROGRAM: Access Code: [email or web address] URL: [email or web address] Date: 10/30/2021 Prepared by: Claiborne Billings   Exercises - Seated Cervical Flexion AROM  - 3 x daily - 7 x weekly - 1 sets - 3 reps - 20 hold - Seated Cervical Sidebending AROM  - 3 x daily - 7 x weekly - 1 sets - 3 reps - 20 hold - Seated Cervical Rotation AROM  - 3 x daily - 7 x weekly - 1 sets - 3 reps - 20 hold - Seated Correct Posture  - 1 x daily - 7 x weekly - 3 sets - 10 reps - Seated Scapular Retraction  - 5 x daily - 7 x weekly - 1 sets - 10 reps - 5 hold   ASSESSMENT:   CLINICAL IMPRESSION:Pt reports overall he is doing well, not a lot of neck pain and ROM feels "good." Pt able to increase bicep curl resistance today but not shoulder weight as 4# remains pretty challenging.    OBJECTIVE IMPAIRMENTS decreased mobility, decreased ROM, decreased strength, hypomobility, increased muscle spasms, impaired flexibility, impaired UE functional use, postural dysfunction, and pain.    ACTIVITY LIMITATIONS lifting, sitting, and reach over head   PARTICIPATION LIMITATIONS: cleaning, laundry, and driving   PERSONAL FACTORS 3+ comorbidities: cervical DDD, CVA, bil LE neuropathy,   are also affecting patient's functional outcome.    REHAB POTENTIAL: Good   CLINICAL DECISION MAKING: Evolving/moderate complexity   EVALUATION COMPLEXITY: Moderate   GOALS: Goals reviewed with patient? Yes   SHORT TERM GOALS: Target date: 11/27/2021    Be independent in initial HEP Baseline:  Goal status: Goal met 11/18/21   2.  Verbalize postural modifications to improve alignment and reduce UE symptoms  Baseline:  Goal status: Goal met 11/18/21   3.  Demonstrate Rt cervical A/ROM rotation to > or = to 70 degrees to improve safety with driving Baseline: 65 Rt, 70 Lt (11/14/21) Goal status: In progress    4.  Report > or = to 30% reduction in neck pain and UE radiculopathy with ADLs and self-care Baseline: 20%  Goal status: 20% 12/05/21   LONG TERM GOALS: Target date: 02/06/22   Be independent in advanced  HEP Baseline:  Goal status: In progress    2.  Improve FOTO to > or = to 52  Baseline: 54 (12/12/21) Goal status: MET   3.  Improve bil grip to > or = to 45# to improve functional use and endurance with ADLs and self-care Baseline: 32/29 Goal status: In progress   4.  Report > or = to 60% reduction in neck pain and UE pain with ADLs and self-care  Baseline: 23% (12/12/21) Goal status: revised    5.  Demonstrate > or = to 80 degrees A/ROM rotation bilaterally to improve safety with driving  Baseline: 38/46 Goal status: in progress        PLAN: PT FREQUENCY: 1-2x/week   PT DURATION: 8 weeks   PLANNED INTERVENTIONS: Therapeutic exercises, Therapeutic activity,  Neuromuscular re-education, Balance training, Gait training, Patient/Family education, Self Care, Joint mobilization, Joint manipulation, Aquatic Therapy, Dry Needling, Electrical stimulation, Spinal manipulation, Spinal mobilization, Cryotherapy, Moist heat, Taping, Traction, Ultrasound, Manual therapy, and Re-evaluation   PLAN FOR NEXT SESSION: continue stretching and postural strength as allowed post-surgery. Review goals next session and MMT shoulders.  Myrene Galas, PTA 01/27/22 1:01 PM   [facility] [address], [place] [address] [place], [place] [address] Phone # [phone] Fax [phone]

## 2022-01-27 ENCOUNTER — Ambulatory Visit: Payer: 59 | Admitting: Physical Therapy

## 2022-01-27 ENCOUNTER — Other Ambulatory Visit (HOSPITAL_BASED_OUTPATIENT_CLINIC_OR_DEPARTMENT_OTHER): Payer: Self-pay

## 2022-01-27 ENCOUNTER — Encounter: Payer: Self-pay | Admitting: Physical Therapy

## 2022-01-27 DIAGNOSIS — R293 Abnormal posture: Secondary | ICD-10-CM | POA: Diagnosis not present

## 2022-01-27 DIAGNOSIS — R252 Cramp and spasm: Secondary | ICD-10-CM

## 2022-01-27 DIAGNOSIS — M542 Cervicalgia: Secondary | ICD-10-CM

## 2022-01-28 ENCOUNTER — Other Ambulatory Visit (HOSPITAL_BASED_OUTPATIENT_CLINIC_OR_DEPARTMENT_OTHER): Payer: Self-pay

## 2022-01-29 ENCOUNTER — Ambulatory Visit: Payer: 59

## 2022-01-29 DIAGNOSIS — R293 Abnormal posture: Secondary | ICD-10-CM | POA: Diagnosis not present

## 2022-01-29 DIAGNOSIS — R252 Cramp and spasm: Secondary | ICD-10-CM

## 2022-01-29 DIAGNOSIS — M542 Cervicalgia: Secondary | ICD-10-CM

## 2022-01-29 NOTE — Therapy (Signed)
OUTPATIENT PHYSICAL THERAPY TREATMENT NOTE   Patient Name: Lenardo Saab MRN: [id number] DOB:07/15/1965, 56 y.o., adult Today's Date: 01/29/2022  PCP: NA-pt reports that he does not currently have a PCP REFERRING PROVIDER: Swartz, Zachary, MD  END OF SESSION:   PT End of Session - 01/29/22 1313     Visit Number 16    Date for PT Re-Evaluation 02/06/22    Authorization Type UMR    PT Start Time 1241    PT Stop Time 1311    PT Time Calculation (min) 30 min    Activity Tolerance Patient tolerated treatment well    Behavior During Therapy WFL for tasks assessed/performed                           Past Medical History:  Diagnosis Date   Abnormal weight loss    Anxiety    Arthritis    Cataract    OU   Celiac disease    Cervical neck pain with evidence of disc disease    patient has a cyst    Chronic constipation    Chronic diastolic heart failure (HCC)    Pt. 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   Family history of adverse reaction to anesthesia    family has problems with anesthesia of nausea and vomiting    GERD (gastroesophageal reflux disease)    History of in 20's   Gluten enteropathy    H/O parotitis    right    Hard of hearing    History of kidney stones    History of retinal tear    Bilateral   History of staph infection    required wound vac   Hx-TIA (transient ischemic attack)    2015   Kidney stones 08/2020   LVH (left ventricular hypertrophy) 12/15/2016   Mild, noted on ECHO   MVP (mitral valve prolapse)    NAFL (nonalcoholic fatty liver)    Neck pain    Neuromuscular disorder (HCC)    bilateral neuropathy feet.   Nuclear sclerotic cataract of both eyes 09/08/2019   Pneumonia 12/17/2010   Polycythemia     Polycythemia, secondary    PONV (postoperative nausea and vomiting)    Protein C deficiency (HCC)    Dr. Feng Follows   Psoriasis    16 X10 cm psoriatic rash on sole of left foot ; open and occ scant bleeding;    psoriatic arthritis    PTSD (post-traumatic stress disorder)    Scaphoid fracture of wrist 09/23/2013   Seizure (HCC)    childhood, medication until age 10 then weanned completely off   Sleep apnea    split night study last done by Dr. Sater 06/18/15 shows severe OSA, CSA, and hypersomnia, rec bipap   Splenomegaly    Stenosis of ureteropelvic junction (UPJ)    left   Stroke (HCC)    CVA vs TIA in left cerebrum causing slight right sided weakness-Dr. Sater follows   Syrinx of spinal cord (HCC) 01/06/2014   c spine on MRI   Tachycardia    hx of    Transfusion history    past history- none recent, after surgeries due   to blood loss   Transgender with history of gender affirmation surgery    Wears glasses    Wears hearing aid    Past Surgical History:  Procedure Laterality Date   ABDOMINAL HYSTERECTOMY Bilateral 1994   TAH, BSO- tranverse incision at 56 yo   ANKLE ARTHROSCOPY WITH RECONSTRUCTION Right 2007   CHOLECYSTECTOMY     laparoscopic   COLONOSCOPY     x3   EYE SURGERY     Left eye 03/02/2018, right 02/15/2018   HIP ARTHROSCOPY W/ LABRAL REPAIR Right 05/11/2013   acetabular labral tear 03/30/2013   KNEE ARTHROPLASTY Right    KNEE JOINT MANIPULATION Left    x3 under anesthesia   KNEE SURGERY Bilateral 1984   Right ACL, left PCL repair   LITHOTRIPSY  2005   LIVER BIOPSY  2013   normal results.   MASTECTOMY Bilateral    prior to 2009   MOUTH SURGERY     NASAL SEPTUM SURGERY N/A 09/20/2015   by ENT Dr. [name]   OVARIAN CYST SURGERY Left    size of grapefruit, was informed that she had shortened vagina   SHOULDER SURGERY Bilateral    Right 08/15/2016, Left 11/15/2016   THUMB ARTHROSCOPY Left    THYROIDECTOMY, PARTIAL Left 2008   TOTAL KNEE ARTHROPLASTY  Right 08/23/2018   Procedure: TOTAL KNEE ARTHROPLASTY;  Surgeon: [name], MD;  Location: [facility];  Service: Orthopedics;  Laterality: Right;  63mn   TOTAL KNEE REVISION Left 02/06/2016   Procedure: LEFT TOTAL KNEE REVISION;  Surgeon: FGaynelle Arabian MD;  Location: [facility];  Service: Orthopedics;  Laterality: Left;   TOTAL KNEE REVISION Left 04/22/2017   Procedure: Left knee polyethylene revision;  Surgeon: AGaynelle Arabian MD;  Location: [facility];  Service: Orthopedics;  Laterality: Left;   UPPER GI ENDOSCOPY  2003   Patient Active Problem List   Diagnosis Date Noted   Posterior vitreous detachment of right eye 11/18/2021   Spondylosis of lumbar spine 10/03/2020   Pseudophakia of both eyes 05/07/2020   Retinal telangiectasis of both eyes 05/07/2020   Lattice degeneration of peripheral retina, left 09/08/2019   Lattice degeneration, right eye 09/08/2019   Chronic diastolic heart failure ([place] 05/24/2019   Urinary dysfunction 04/12/2019   Osteoarthritis of right knee 08/23/2018   Constipation due to opioid therapy 03/30/2018   SNHL (sensorineural hearing loss) 12/04/2017   Abnormal urinary stream 12/03/2017   Osteoarthritis of carpometacarpal (CMC) joint of thumb 11/30/2017   Muscle weakness 11/17/2017   Transient vision disturbance 11/12/2017   Bilateral hand pain 10/30/2017   Pain of left hip joint 10/09/2017   Gynecomastia 07/10/2017   Iron deficiency anemia 07/05/2017   Spasticity 05/20/2017   Lumbar radiculitis 04/20/2017   Ulnar 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, left 11/28/2016   Idiopathic peripheral neuropathy 11/28/2016   Failed total knee arthroplasty, 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   Long term (current) use of anticoagulants 08/23/2015   Right upper quadrant abdominal pain 08/23/2015   Memory loss 05/10/2015   Gait abnormality 04/07/2015   Alkaline phosphatase elevation 04/07/2015   History of thrombosis 03/26/2015   Medial epicondylitis 02/07/2015   Cognitive decline 12/21/2014   Leukopenia  12/05/2014   Rotator cuff syndrome of right shoulder 10/27/2014   Status post left knee replacement 08/22/2014   Left lateral epicondylitis 08/22/2014   Chronic cerebral ischemia 08/18/2014   Arthrofibrosis of knee joint 08/17/2014   Cubital canal compression syndrome, left 08/17/2014   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   Chronic pain syndrome 04/10/2014  Insomnia 04/10/2014   Chronic non-specific white matter lesions on MRI 04/10/2014   CFS (chronic fatigue syndrome) 04/10/2014   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[place]) 03/01/2014   Biceps tendonitis on left 03/01/2014   Polycythemia vera ([place]) 12/27/2013   H/O TIA (transient ischemic attack) and stroke 12/27/2013   Neck pain 12/27/2013   OSA (obstructive sleep apnea) 12/08/2013   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/31/2013   Depression with anxiety 08/04/2013   Protein C deficiency ([place]) 08/01/2013   Post traumatic stress disorder (PTSD) 08/01/2013   Speech abnormality 07/25/2013   Obesity 05/10/2013   Lower extremity edema 05/10/2013   GERD (gastroesophageal reflux disease) 05/10/2013   Arthritis 05/10/2013   OA (osteoarthritis) of knee 03/15/2013   Headache 10/25/2012   Palpitations 10/18/2012   Fatty liver determined by biopsy 06/01/2012   Arthropathic psoriasis, unspecified ([place]) 04/29/2012   Abnormal liver enzymes 03/29/2012   Psoriatic arthritis ([place]) 12/29/2011   Left Renal Hydronephrosis 12/11/2010   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     THERAPY DIAG:  Abnormal posture  Cervicalgia  Cramp and spasm  Rationale for Evaluation and Treatment Rehabilitation  PERTINENT HISTORY: Lumbar stenosis L5-S1, failed TKA 2019, neuropathy in bil feet, PTSD, CVA vs TIA Rt weakness, head trauma 2019, Rt thumb  replacement, bil total knee replacements, bil shoulder scope surgeries  PRECAUTIONS: Fall  SUBJECTIVE: My right side of my neck is tight. I would rather do massage vs traction.    PAIN:  Are you having pain? Neck is just sore not not pain/ NPRS scale: 4/10 Pain location: neck Lt>Rt PAIN TYPE: aching Pain description: intermittent and aching  Aggravating factors: nothing specific, at the end of the day from fatigue Relieving factors: traction, rest   OBJECTIVE: (objective measures completed at initial evaluation unless otherwise dated)  DIAGNOSTIC FINDINGS:  MRI on 10/24/21: No interval change of the syrinx (fluid filled cyst) spanning the C4 through C6 levels measuring up to 4 mm in diameter. 2. Mild degenerative changes of the cervical spine without high-grade spinal canal stenosis at any level. 3. Mild to moderate left neural foraminal narrowing at C3-4, unchanged.   PATIENT SURVEYS:  FOTO 36 (52 is goal) 12/12/21: 54-goal met   COGNITION: Overall cognitive status: Within functional limits for tasks assessed SENSATION: WFL   POSTURE: rounded shoulders and forward head   PALPATION: Trigger points in bil suboccipitals, upper traps and tension in bil cervical paraspinals.  Pt with reduced segmental mobility C3-7 and T1-5.  Pain reduction with manual cervical traction in supine.              CERVICAL ROM:    Active ROM A/PROM (deg) eval A/ROM 11/11/21 A/ROM 12/12/21  Flexion 50    Extension 30    Right lateral flexion 35    Left lateral flexion 40    Right rotation 60 65 70  Left rotation 70 80 75   (Blank rows = not tested)   UPPER EXTREMITY ROM:   Bil UE A/ROM limited by 25% in all directions.    UPPER EXTREMITY MMT:   MMT Right eval Left eval  Shoulder flexion 4-/5 4/5  Shoulder extension 4- 4  Shoulder abduction 4- 4-  Shoulder adduction      Shoulder extension      Shoulder internal rotation 4+ 4+  Shoulder external  rotation 4- 4-  Middle  trapezius      Lower trapezius      Elbow flexion      Elbow extension      Wrist flexion      Wrist extension      Wrist ulnar deviation      Wrist radial deviation      Wrist pronation      Wrist supination      Grip strength 32 29   (Blank rows = not tested)   TODAY'S TREATMENT:  01/29/22: Arm bike; L1.7 4x4 with PTA present to monitor and discuss current status.  Bicep Curls 6# 2x10 4# 3 was raise below 90 degrees 12x Manual therapy: elongation to Rt neck with lateral mobs and rotational mobs   01/27/22: Arm bike; L1.7 4x4 with PTA present to monitor and discuss current status.  Bicep Curls 6# 2x10 Cervical traction: 18# intermittent 60 second hold, 10 second rest x10 4# 3 was raise below 90 degrees 12x  01/20/22: Arm bike; L1.6 4x4 with PTA present to monitor and discuss current status.  Bicep Curls 5# 2x10 Cervical traction: 18# intermittent 60 second hold, 10 second rest x10 4# 3 was raise below 90 degrees 12x   PATIENT EDUCATION:  Education details: Access Code: [email or web address] Person educated: Patient and Parent Education method: Explanation, Demonstration, and Handouts Education comprehension: verbalized understanding and returned demonstration     HOME EXERCISE PROGRAM: Access Code: [email or web address] URL: [email or web address] Date: 10/30/2021 Prepared by:    Exercises - Seated Cervical Flexion AROM  - 3 x daily - 7 x weekly - 1 sets - 3 reps - 20 hold - Seated Cervical Sidebending AROM  - 3 x daily - 7 x weekly - 1 sets - 3 reps - 20 hold - Seated Cervical Rotation AROM  - 3 x daily - 7 x weekly - 1 sets - 3 reps - 20 hold - Seated Correct Posture  - 1 x daily - 7 x weekly - 3 sets - 10 reps - Seated Scapular Retraction  - 5 x daily - 7 x weekly - 1 sets - 10 reps - 5 hold   ASSESSMENT:   CLINICAL IMPRESSION:  Pt with increased tension in the Rt neck today  Overall pain is better and pt will have 2 more sessions probable.  He is pursuing a home  traction unit for use after D/C.  Pt is challenged by current level of exercise.  PT monitored for technique and alignment.  Pt with tension in Rt neck and responded well to manual therapy.  OBJECTIVE IMPAIRMENTS decreased mobility, decreased ROM, decreased strength, hypomobility, increased muscle spasms, impaired flexibility, impaired UE functional use, postural dysfunction, and pain.    ACTIVITY LIMITATIONS lifting, sitting, and reach over head   PARTICIPATION LIMITATIONS: cleaning, laundry, and driving   PERSONAL FACTORS 3+ comorbidities: cervical DDD, CVA, bil LE neuropathy,   are also affecting patient's functional outcome.    REHAB POTENTIAL: Good   CLINICAL DECISION MAKING: Evolving/moderate complexity   EVALUATION COMPLEXITY: Moderate   GOALS: Goals reviewed with patient? Yes   SHORT TERM GOALS: Target date: 11/27/2021    Be independent in initial HEP Baseline:  Goal status: Goal met 11/18/21   2.  Verbalize postural modifications to improve alignment and reduce UE symptoms  Baseline:  Goal status: Goal met 11/18/21   3.  Demonstrate Rt cervical A/ROM rotation to > or = to 70 degrees to improve safety with driving Baseline: 65 Rt, 70 Lt (  11/14/21) Goal status: In progress    4.  Report > or = to 30% reduction in neck pain and UE radiculopathy with ADLs and self-care Baseline: 20%  Goal status: 20% 12/05/21   LONG TERM GOALS: Target date: 02/06/22   Be independent in advanced HEP Baseline:  Goal status: In progress    2.  Improve FOTO to > or = to 52  Baseline: 54 (12/12/21) Goal status: MET   3.  Improve bil grip to > or = to 45# to improve functional use and endurance with ADLs and self-care Baseline: 32/29 Goal status: In progress   4.  Report > or = to 60% reduction in neck pain and UE pain with ADLs and self-care  Baseline: 23% (12/12/21) Goal status: revised    5.  Demonstrate > or = to 80 degrees A/ROM rotation bilaterally to improve safety with driving   Baseline: 70/75 Goal status: in progress        PLAN: PT FREQUENCY: 1-2x/week   PT DURATION: 8 weeks   PLANNED INTERVENTIONS: Therapeutic exercises, Therapeutic activity, Neuromuscular re-education, Balance training, Gait training, Patient/Family education, Self Care, Joint mobilization, Joint manipulation, Aquatic Therapy, Dry Needling, Electrical stimulation, Spinal manipulation, Spinal mobilization, Cryotherapy, Moist heat, Taping, Traction, Ultrasound, Manual therapy, and Re-evaluation   PLAN FOR NEXT SESSION: continue stretching and postural strength as allowed post-surgery. 2 more sessions probable.     , PT 01/29/22 1:14 PM   [facility] [address], [address] [place], [place] [address] Phone # [phone] Fax [phone]  

## 2022-01-30 ENCOUNTER — Ambulatory Visit: Payer: 59 | Attending: Cardiovascular Disease | Admitting: Cardiovascular Disease

## 2022-01-30 ENCOUNTER — Other Ambulatory Visit (HOSPITAL_COMMUNITY): Payer: Self-pay

## 2022-01-30 ENCOUNTER — Encounter: Payer: Self-pay | Admitting: Cardiovascular Disease

## 2022-01-30 ENCOUNTER — Other Ambulatory Visit (HOSPITAL_BASED_OUTPATIENT_CLINIC_OR_DEPARTMENT_OTHER): Payer: Self-pay

## 2022-01-30 ENCOUNTER — Other Ambulatory Visit: Payer: Self-pay | Admitting: Registered Nurse

## 2022-01-30 VITALS — BP 100/78 | HR 101 | Ht 69.0 in | Wt 202.0 lb

## 2022-01-30 DIAGNOSIS — I5032 Chronic diastolic (congestive) heart failure: Secondary | ICD-10-CM | POA: Diagnosis not present

## 2022-01-30 DIAGNOSIS — D6859 Other primary thrombophilia: Secondary | ICD-10-CM | POA: Diagnosis not present

## 2022-01-30 DIAGNOSIS — G4733 Obstructive sleep apnea (adult) (pediatric): Secondary | ICD-10-CM

## 2022-01-30 DIAGNOSIS — R002 Palpitations: Secondary | ICD-10-CM

## 2022-01-30 DIAGNOSIS — M47816 Spondylosis without myelopathy or radiculopathy, lumbar region: Secondary | ICD-10-CM

## 2022-01-30 NOTE — Progress Notes (Signed)
Cardiology Office Note:    Date:  02/02/2022   ID:  Jesse Bowers, DOB 25-Sep-1965, MRN [id number]  PCP:  Patient, No Pcp Per  Cardiologist:  Sanda Klein, MD   Referring MD: Concepcion Elk, MD   Chief Complaint  Patient presents with   Congestive Heart Failure     History of Present Illness:    Jesse Bowers is a 56 y.o. adult with a hx of chronic diastolic heart failure, obstructive sleep apnea on CPAP, history of previous stroke on chronic anticoagulation with Xarelto (protein C deficiency) returning for routine follow-up. He underwent gender affirmation surgery in December 2022, on chronic androgen therapy (transition began around the age of 34).  He has no overt cardiovascular complaints at this time.  His electrical scooter is not working so he had to walk to the office appointment today.  He has problems with back pain, but this did not cause any shortness of breath.  He has not had lower extremity edema, orthopnea, PND.  He has had cataracts removed and laser surgery for macular degeneration, for which she sees Dr. Zadie Rhine.  Has not had other neurological complaints.  Denies palpitations, dizziness or syncope.  He has not had any falls and has not had any bleeding problems.  He reports compliance with Xarelto.  No longer using CPAP due to issues with the equipment, comfort of the facemask.  It seems he may have lost some real weight.  Previously his home scale his "dry weight" was about 210 pounds, but on the office scale today he only weighs 202 pounds.  He has not weighed recently at home.  He takes furosemide 80 mg daily.  He takes an extra 20 mg if he has edema, but this happens no more than once or twice a month.  Having some difficulty finding ways to stay on a gluten-free diet that is also low in sodium.  He remains quite sedentary due to the sequelae of the stroke and his orthopedic and spinal problems.  Continues on Cosentyx and methotrexate for psoriasis, with good results.     Labs on 05/01/2021 showed total cholesterol 154, HDL 54, LDL 86, triglycerides 70.  Most recent hemoglobin A1c was 5.4%.  Hemoglobin has normalized completely in September is up to 15.3.    His last echo was 04/27/2020. He has preserved left ventricular systolic function (NW>29%, GLS -24%), but with some echo evidence of diastolic dysfunction (mildly decreased e' for age, E/A 0.7).   He has complex obstructive and central sleep apnea, with mild daytime hypersomnolence, using his BiPAP. On MTX and Cosentyx for psoriatic arthritis.  His daughter is having a successful time in college at [employer] in [place], where she swims competitively.  Past Medical History:  Diagnosis Date   Abnormal weight loss    Anxiety    Arthritis    Cataract    OU   Celiac disease    Cervical neck pain with evidence of disc disease    patient has a cyst    Chronic constipation    Chronic diastolic heart failure (HCC)    Pt. 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   Family history of adverse reaction to anesthesia  family has problems with anesthesia of nausea and vomiting    GERD (gastroesophageal reflux disease)    History of in 20's   Gluten enteropathy    H/O parotitis    right    Hard of hearing    History of kidney stones    History of retinal tear    Bilateral   History of staph infection    required wound vac   Hx-TIA (transient ischemic attack)    2015   Kidney stones 08/2020   LVH (left ventricular hypertrophy) 12/15/2016   Mild, noted on ECHO   MVP (mitral valve prolapse)    NAFL (nonalcoholic fatty liver)    Neck pain    Neuromuscular disorder ([place])    bilateral neuropathy feet.   Nuclear sclerotic cataract of both eyes 09/08/2019    Pneumonia 12/17/2010   Polycythemia    Polycythemia, secondary    PONV (postoperative nausea and vomiting)    Protein C deficiency (HCC)    Dr. Anne Fu   Psoriasis    16 X10 cm psoriatic rash on sole of left foot ; open and occ scant bleeding;    psoriatic arthritis    PTSD (post-traumatic stress disorder)    Scaphoid fracture of wrist 09/23/2013   Seizure ([place])    childhood, medication until age 72 then weanned completely off   Sleep apnea    split night study last done by Dr. [name] 06/18/15 shows severe OSA, CSA, and hypersomnia, rec bipap   Splenomegaly    Stenosis of ureteropelvic junction (UPJ)    left   Stroke [facility])    CVA vs TIA in left cerebrum causing slight right sided weakness-Dr. 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    Wears glasses    Wears hearing aid     Past Surgical History:  Procedure Laterality Date   ABDOMINAL HYSTERECTOMY Bilateral 1994   TAH, BSO- tranverse incision at 56 yo   ANKLE ARTHROSCOPY WITH RECONSTRUCTION Right 2007   CHOLECYSTECTOMY     laparoscopic   COLONOSCOPY     x3   EYE SURGERY     Left eye 03/02/2018, right 02/15/2018   HIP ARTHROSCOPY W/ LABRAL REPAIR Right 05/11/2013   acetabular labral tear 03/30/2013   KNEE ARTHROPLASTY Right    KNEE JOINT MANIPULATION Left    x3 under anesthesia   KNEE SURGERY Bilateral 1984   Right ACL, left PCL repair   LITHOTRIPSY  2005   LIVER BIOPSY  2013   normal results.   MASTECTOMY Bilateral    prior to 2009   MOUTH SURGERY     NASAL SEPTUM SURGERY N/A 09/20/2015   by ENT Dr. [name]   OVARIAN CYST SURGERY Left    size of grapefruit, was informed that she had shortened vagina   SHOULDER SURGERY Bilateral    Right 08/15/2016, Left 11/15/2016   THUMB ARTHROSCOPY Left    THYROIDECTOMY,  PARTIAL Left 2008   TOTAL KNEE ARTHROPLASTY Right 08/23/2018   Procedure: TOTAL KNEE ARTHROPLASTY;  Surgeon: [name], MD;  Location: [facility];  Service: Orthopedics;  Laterality: Right;  84mn   TOTAL KNEE REVISION Left 02/06/2016   Procedure: LEFT TOTAL KNEE REVISION;  Surgeon: FGaynelle Arabian MD;  Location: [facility];  Service: Orthopedics;  Laterality: Left;   TOTAL KNEE REVISION Left 04/22/2017   Procedure: Left knee polyethylene revision;  Surgeon: [name], MD;  Location: [facility];  Service: Orthopedics;  Laterality: Left;   UPPER GI ENDOSCOPY  2003    Current Medications: Current Meds  Medication Sig   baclofen (LIORESAL) 20 MG tablet Take 1-2 tablets (20-40 mg total) by mouth 4 (four) times daily. 1 tablet with breakfast, lunch, and dinner and 2 tablets at bedtime   Calcium Carbonate Antacid (TUMS ULTRA 1000 PO) Take by mouth daily.   cycloSPORINE (RESTASIS) 0.05 % ophthalmic emulsion Instill 1 drop into both eyes twice a day   desonide (DESOWEN) 0.05 % cream 1(one) application(s) topical 2(two) times a day   desvenlafaxine (PRISTIQ) 100 MG 24 hr tablet Take 1 tab daily,   FIBER SELECT GUMMIES PO Take by mouth daily.   folic acid (KP FOLIC ACID) 1 MG tablet Take 1 tablet po every day   furosemide (LASIX) 20 MG tablet Take 1 tablet (20 mg total) by mouth daily as needed for edema.   furosemide (LASIX) 80 MG tablet Take 1 tablet (80 mg total) by mouth daily.   halobetasol (ULTRAVATE) 0.05 % cream Apply to affected area twice daily to hands, feet and knees.   hydrOXYzine (ATARAX) 25 MG tablet Take 1 tablet (25 mg total) by mouth 3 times daily as needed for anxety.   methotrexate (RHEUMATREX) 2.5 MG tablet Take 6 tablets (15 mg total) by mouth once a week.   Oxycodone HCl 10 MG TABS Take 1 tablet (10 mg total) by mouth every 8 (eight) hours as needed (pain).   potassium chloride (KLOR-CON) 10 MEQ tablet Take 2 tablets by mouth every day.   rivaroxaban (XARELTO) 20 MG TABS tablet Take  1 tablet (20 mg total) by mouth nightly.   Secukinumab (COSENTYX SENSOREADY PEN) 150 MG/ML SOAJ Inject 2 pens subcutaneously every 4 weeks   Tenapanor HCl (IBSRELA) 50 MG TABS Take 1 tablet (50 mg total) by mouth 2 times daily.   Testosterone 25 MG/2.5GM (1%) GEL Apply a pea-sized amount to affected area once daily.   testosterone cypionate (DEPOTESTOSTERONE CYPIONATE) 200 MG/ML injection Inject 0.5 mLs (100 mg total) into the muscle every 7 days. Discard vial after each dose.   ursodiol (ACTIGALL) 250 MG tablet Take 1 tablet by mouth at lunch and dinner in addition to ursodiol 500 mg for a total 750 mg dose.   ursodiol (ACTIGALL) 500 MG tablet Take 1 tablet (500 mg) by mouth at lunch and dinner in addition to ursodiol 250 mg for a total 750 mg dose.   Vitamin D, Ergocalciferol, (DRISDOL) 1.25 MG (50000 UNIT) CAPS capsule Take 1 capsule (50,000 Units total) by mouth once a week.   [DISCONTINUED] morphine (MS CONTIN) 30 MG 12 hr tablet Take 1 tablet (30 mg total) by mouth every 12 (twelve) hours.     Allergies:   Gabapentin, Penicillin g, Sulfa antibiotics, Nortriptyline, Pregabalin, Tegaderm ag mesh [silver], Ibuprofen, and Sulfacetamide sodium-sulfur   Social History   Socioeconomic History   Marital status: Married    Spouse name: Not on file   Number of children: 2   Years of education: 4y college   Highest education level: Not on file  Occupational History   Occupation: Pediatric Nurse practitioner    Comment: Not working since CVA 2015  Tobacco Use   Smoking status: Never   Smokeless tobacco: Never  Vaping Use   Vaping Use: Never used  Substance and Sexual Activity   Alcohol use: Yes    Comment: social   Drug use: No   Sexual activity: Yes    Birth control/protection: None    Comment: patient is a transgender on testosterone shots, no biological kids  Other Topics Concern   Not on file  Social History Narrative   Education 4 year college, former Therapist, sports X 15 years, pediatric  nurse practitioner x 6 years, did NP degree from [employer] [place]. Relocated to [place] about 2 months ago from [place]. Patient was in [place] for last 4 years and prior to that in [place]. His wife is working as Scientist, research (physical sciences) for [employer]. Patient is not working and applying for disability. They have 2 kids but no biologic children.    Social Determinants of Health   Financial Resource Strain: Not on file  Food Insecurity: Not on file  Transportation Needs: Not on file  Physical Activity: Not on file  Stress: Not on file  Social Connections: Not on file     Family History: The patient's family history includes Breast cancer in his sister; Breast cancer (age of onset: 55) in his maternal aunt; Cancer in his paternal grandfather; Congestive Heart Failure in his maternal grandmother; Glaucoma in his maternal grandfather and mother; Heart attack in his maternal grandfather, maternal grandmother, and paternal grandfather; Hypertension in his mother; Macular degeneration in his maternal grandfather; Other in his mother; Polycythemia in his paternal uncle; Protein C deficiency (age of onset: 19) in his sister; Psoriasis in his mother; Stroke in his maternal grandfather, maternal grandmother, and paternal uncle.  ROS:   Please see the history of present illness.    All other systems are reviewed and are negative  EKGs/Labs/Other Studies Reviewed:    EKG: EKG is ordered today and personally reviewed.  It shows borderline sinus tachycardia 101 bpm but is otherwise a normal tracing.  Recent Labs: 11/05/2021: ALT 36; BUN 18; Creatinine, Ser 1.04; Hemoglobin 15.3; Platelets 204; Potassium 4.5; Sodium 140; TSH 2.206  Recent Lipid Panel    Component Value Date/Time   CHOL 142 01/05/2018 1509   TRIG 45 01/05/2018 1509   HDL 52 01/05/2018 1509   CHOLHDL 2.7 01/05/2018 1509   LDLCALC 81 01/05/2018 1509   05/01/2021  cholesterol 154, HDL 54, LDL 86, triglycerides  70.  11/05/2021 Hemoglobin 15.3, creatinine 1.04, potassium 4.5, ALT 36, TSH 2.20  Most recent hemoglobin A1c was 5.4% (August 2022).  Physical Exam:    VS:  BP 100/78 (BP Location: Left Arm, Patient Position: Sitting, Cuff Size: Large)   Pulse (!) 101   Ht 5' 9"  (1.753 m)   Wt 91.6 kg   SpO2 99%   BMI 29.83 kg/m     Wt Readings from Last 3 Encounters:  01/30/22 91.6 kg  01/13/22 94.3 kg  10/02/21 94.8 kg     General: Alert, oriented x3, no distress, overweight Head: no evidence of trauma, PERRL, EOMI, no exophtalmos or lid lag, no myxedema, no xanthelasma; normal ears, nose and oropharynx Neck: normal jugular venous pulsations and no hepatojugular reflux; brisk carotid pulses without delay and no carotid bruits Chest: clear to auscultation, no signs of consolidation by percussion or palpation, normal fremitus, symmetrical and full respiratory excursions Cardiovascular: normal position and quality of the apical impulse, regular rhythm, normal first and second heart sounds, no murmurs, rubs or gallops Abdomen: no tenderness or distention, no masses by palpation, no abnormal pulsatility or arterial bruits, normal bowel sounds, no hepatosplenomegaly  Extremities: no clubbing, cyanosis or edema; 2+ radial, ulnar and brachial pulses bilaterally; 2+ right femoral, posterior tibial and dorsalis pedis pulses; 2+ left femoral, posterior tibial and dorsalis pedis pulses; no subclavian or femoral bruits Neurological: grossly nonfocal Psych: Normal mood and affect      ASSESSMENT:    1. Chronic diastolic heart failure ([place])   2. OSA (obstructive sleep apnea)   3. Protein C deficiency ([place])   4. Palpitations       PLAN:    In order of problems listed above:  CHF: Mild diastolic dysfunction, clinically euvolemic on current dose of diuretic.   OSA: Strongly encouraged him to start using CPAP again.  He is lost some weight but that does not guarantee that his sleep apnea has  resolved.  Current complaints of fatigue may be related to not using the device. Protein C deficiency: On chronic Xarelto without bleeding problems.. Reported history of stroke. MRI stable 2016-2017-2019: "tiny punctate periventricular and subcortical white matter hyperintensities likely due to chronic microvascular ischemia", "Scattered chronic cerebral white matter signal abnormality is nonspecific but compatible with sequelae of hypercoagulable state".  He does not have a history of atrial fibrillation.  Palpitations: Currently not a big bother.  Most recent monitor showed only sinus tachycardia, previous monitor showed occasional isolated PACs and PVCs.  Atrial fibrillation has never been documented.  Feels better when taking a low-dose of metoprolol succinate, which will be continued. Neurogenic claudication: Good pulses on physical exam, normal ABIs in September 2020  Medication Adjustments/Labs and Tests Ordered: Current medicines are reviewed at length with the patient today.  Concerns regarding medicines are outlined above.  Orders Placed This Encounter  Procedures   EKG 12-Lead    No orders of the defined types were placed in this encounter.    Patient Instructions  Medication Instructions:  Your physician recommends that you continue on your current medications as directed. Please refer to the Current Medication list given to you today.  *If you need a refill on your cardiac medications before your next appointment, please call your pharmacy*  Lab Work: NONE ordered at this time of appointment   If you have labs (blood work) drawn today and your tests are completely normal, you will receive your results only by: [place] (if you have MyChart) OR A paper copy in the mail If you have any lab test that is abnormal or we need to change your treatment, we will call you to review the results.   Testing/Procedures: NONE ordered at this time of appointment   Follow-Up: At  [facility], you and your health needs are our priority.  As part of our continuing mission to provide you with exceptional heart care, we have created designated Provider Care Teams.  These Care Teams include your primary Cardiologist (physician) and Advanced Practice Providers (APPs -  Physician Assistants and Nurse Practitioners) who all work together to provide you with the care you need, when you need it.  Your next appointment:   1 year(s)  The format for your next appointment:   In Person  Provider:   Sanda Klein, MD     Other Instructions  Important Information About Sugar         Signed, Sanda Klein, MD  02/02/2022 3:04 PM    [place]

## 2022-01-30 NOTE — Patient Instructions (Signed)
Medication Instructions:  Your physician recommends that you continue on your current medications as directed. Please refer to the Current Medication list given to you today.  *If you need a refill on your cardiac medications before your next appointment, please call your pharmacy*  Lab Work: NONE ordered at this time of appointment   If you have labs (blood work) drawn today and your tests are completely normal, you will receive your results only by: [place] (if you have MyChart) OR A paper copy in the mail If you have any lab test that is abnormal or we need to change your treatment, we will call you to review the results.   Testing/Procedures: NONE ordered at this time of appointment   Follow-Up: At [facility] you and your health needs are our priority.  As part of our continuing mission to provide you with exceptional heart care, we have created designated Provider Care Teams.  These Care Teams include your primary Cardiologist (physician) and Advanced Practice Providers (APPs -  Physician Assistants and Nurse Practitioners) who all work together to provide you with the care you need, when you need it.  Your next appointment:   1 year(s)  The format for your next appointment:   In Person  Provider:   Sanda Klein, MD     Other Instructions  Important Information About Sugar

## 2022-01-31 ENCOUNTER — Other Ambulatory Visit (HOSPITAL_BASED_OUTPATIENT_CLINIC_OR_DEPARTMENT_OTHER): Payer: Self-pay

## 2022-01-31 ENCOUNTER — Encounter: Payer: Self-pay | Admitting: Physical Medicine & Rehabilitation

## 2022-01-31 MED ORDER — MORPHINE SULFATE ER 30 MG PO TBCR
30.0000 mg | EXTENDED_RELEASE_TABLET | Freq: Two times a day (BID) | ORAL | 0 refills | Status: DC
  Filled 2022-01-31: qty 60, 30d supply, fill #0

## 2022-02-01 ENCOUNTER — Other Ambulatory Visit (HOSPITAL_BASED_OUTPATIENT_CLINIC_OR_DEPARTMENT_OTHER): Payer: Self-pay

## 2022-02-02 ENCOUNTER — Encounter: Payer: Self-pay | Admitting: Cardiovascular Disease

## 2022-02-03 ENCOUNTER — Other Ambulatory Visit (HOSPITAL_BASED_OUTPATIENT_CLINIC_OR_DEPARTMENT_OTHER): Payer: Self-pay

## 2022-02-03 ENCOUNTER — Encounter: Payer: Self-pay | Admitting: Physical Therapy

## 2022-02-03 ENCOUNTER — Other Ambulatory Visit (HOSPITAL_COMMUNITY): Payer: Self-pay

## 2022-02-03 ENCOUNTER — Ambulatory Visit: Payer: 59 | Attending: Physical Medicine & Rehabilitation | Admitting: Physical Therapy

## 2022-02-03 DIAGNOSIS — R293 Abnormal posture: Secondary | ICD-10-CM | POA: Diagnosis present

## 2022-02-03 DIAGNOSIS — M542 Cervicalgia: Secondary | ICD-10-CM | POA: Diagnosis present

## 2022-02-03 DIAGNOSIS — R252 Cramp and spasm: Secondary | ICD-10-CM | POA: Insufficient documentation

## 2022-02-03 MED ORDER — IBSRELA 50 MG PO TABS
ORAL_TABLET | ORAL | 1 refills | Status: DC
  Filled 2022-02-03: qty 180, 90d supply, fill #0

## 2022-02-03 NOTE — Therapy (Signed)
OUTPATIENT PHYSICAL THERAPY TREATMENT NOTE   Patient Name: Jesse Bowers MRN: [id number] DOB:Mar 19, 1965, 56 y.o., adult Today's Date: 02/03/2022  PCP: NA-pt reports that he does not currently have a PCP REFERRING PROVIDER: Alger Simons, MD  END OF SESSION:   PT End of Session - 02/03/22 1241     Visit Number 17    Date for PT Re-Evaluation 02/06/22    Authorization Type UMR    PT Start Time 1240   pt late   PT Stop Time 1315    PT Time Calculation (min) 35 min    Activity Tolerance Patient tolerated treatment well    Behavior During Therapy WFL for tasks assessed/performed                            Past Medical History:  Diagnosis Date   Abnormal weight loss    Anxiety    Arthritis    Cataract    OU   Celiac disease    Cervical neck pain with evidence of disc disease    patient has a cyst    Chronic constipation    Chronic diastolic heart failure ([place])    Pt. 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   Family history of adverse reaction to anesthesia    family has problems with anesthesia of nausea and vomiting    GERD (gastroesophageal reflux disease)    History of in 20's   Gluten enteropathy    H/O parotitis    right    Hard of hearing    History of kidney stones    History of retinal tear    Bilateral   History of staph infection    required wound vac   Hx-TIA (transient ischemic attack)    2015   Kidney stones 08/2020   LVH (left ventricular hypertrophy) 12/15/2016   Mild, noted on ECHO   MVP (mitral valve prolapse)    NAFL (nonalcoholic fatty liver)    Neck pain    Neuromuscular disorder ([place])    bilateral neuropathy feet.   Nuclear sclerotic cataract of both eyes 09/08/2019   Pneumonia 12/17/2010    Polycythemia    Polycythemia, secondary    PONV (postoperative nausea and vomiting)    Protein C deficiency (HCC)    Dr. Anne Fu   Psoriasis    16 X10 cm psoriatic rash on sole of left foot ; open and occ scant bleeding;    psoriatic arthritis    PTSD (post-traumatic stress disorder)    Scaphoid fracture of wrist 09/23/2013   Seizure ([place])    childhood, medication until age 61 then weanned completely off   Sleep apnea    split night study last done by Dr. [name] 06/18/15 shows severe OSA, CSA, and hypersomnia, rec bipap   Splenomegaly    Stenosis of ureteropelvic junction (UPJ)    left   Stroke [facility])    CVA vs TIA in left cerebrum causing slight right sided weakness-Dr. [name] follows   Syrinx of spinal cord ([place]) 01/06/2014   c spine on MRI   Tachycardia    hx of    Transfusion history    past history- none  recent, after surgeries due to blood loss   Transgender with history of gender affirmation surgery    Wears glasses    Wears hearing aid    Past Surgical History:  Procedure Laterality Date   ABDOMINAL HYSTERECTOMY Bilateral 1994   TAH, BSO- tranverse incision at 56 yo   ANKLE ARTHROSCOPY WITH RECONSTRUCTION Right 2007   CHOLECYSTECTOMY     laparoscopic   COLONOSCOPY     x3   EYE SURGERY     Left eye 03/02/2018, right 02/15/2018   HIP ARTHROSCOPY W/ LABRAL REPAIR Right 05/11/2013   acetabular labral tear 03/30/2013   KNEE ARTHROPLASTY Right    KNEE JOINT MANIPULATION Left    x3 under anesthesia   KNEE SURGERY Bilateral 1984   Right ACL, left PCL repair   LITHOTRIPSY  2005   LIVER BIOPSY  2013   normal results.   MASTECTOMY Bilateral    prior to 2009   MOUTH SURGERY     NASAL SEPTUM SURGERY N/A 09/20/2015   by ENT Dr. [name]   OVARIAN CYST SURGERY Left    size of grapefruit, was informed that she had shortened vagina   SHOULDER SURGERY Bilateral    Right 08/15/2016, Left 11/15/2016   THUMB ARTHROSCOPY Left    THYROIDECTOMY, PARTIAL Left 2008   TOTAL KNEE  ARTHROPLASTY Right 08/23/2018   Procedure: TOTAL KNEE ARTHROPLASTY;  Surgeon: [name], MD;  Location: [facility];  Service: Orthopedics;  Laterality: Right;  90mn   TOTAL KNEE REVISION Left 02/06/2016   Procedure: LEFT TOTAL KNEE REVISION;  Surgeon: FGaynelle Arabian MD;  Location: [facility];  Service: Orthopedics;  Laterality: Left;   TOTAL KNEE REVISION Left 04/22/2017   Procedure: Left knee polyethylene revision;  Surgeon: AGaynelle Arabian MD;  Location: [facility];  Service: Orthopedics;  Laterality: Left;   UPPER GI ENDOSCOPY  2003   Patient Active Problem List   Diagnosis Date Noted   Posterior vitreous detachment of right eye 11/18/2021   Spondylosis of lumbar spine 10/03/2020   Pseudophakia of both eyes 05/07/2020   Retinal telangiectasis of both eyes 05/07/2020   Lattice degeneration of peripheral retina, left 09/08/2019   Lattice degeneration, right eye 09/08/2019   Chronic diastolic heart failure ([place] 05/24/2019   Urinary dysfunction 04/12/2019   Osteoarthritis of right knee 08/23/2018   Constipation due to opioid therapy 03/30/2018   SNHL (sensorineural hearing loss) 12/04/2017   Abnormal urinary stream 12/03/2017   Osteoarthritis of carpometacarpal (CMC) joint of thumb 11/30/2017   Muscle weakness 11/17/2017   Transient vision disturbance 11/12/2017   Bilateral hand pain 10/30/2017   Pain of left hip joint 10/09/2017   Gynecomastia 07/10/2017   Iron deficiency anemia 07/05/2017   Spasticity 05/20/2017   Lumbar radiculitis 04/20/2017   Ulnar 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, left 11/28/2016   Idiopathic peripheral neuropathy 11/28/2016   Failed total knee arthroplasty, 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   Long term (current) use of anticoagulants 08/23/2015   Right upper quadrant abdominal pain 08/23/2015   Memory loss 05/10/2015   Gait abnormality 04/07/2015   Alkaline phosphatase elevation 04/07/2015   History of thrombosis 03/26/2015   Medial epicondylitis 02/07/2015   Cognitive decline 12/21/2014    Leukopenia 12/05/2014   Rotator cuff syndrome of right shoulder 10/27/2014   Status post left knee replacement 08/22/2014   Left lateral epicondylitis 08/22/2014   Chronic cerebral ischemia 08/18/2014   Arthrofibrosis of knee joint 08/17/2014   Cubital canal compression syndrome, left 08/17/2014   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 04/10/2014   Chronic pain  syndrome 04/10/2014   Insomnia 04/10/2014   Chronic non-specific white matter lesions on MRI 04/10/2014   CFS (chronic fatigue syndrome) 04/10/2014   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[place]) 03/01/2014   Biceps tendonitis on left 03/01/2014   Polycythemia vera ([place]) 12/27/2013   H/O TIA (transient ischemic attack) and stroke 12/27/2013   Neck pain 12/27/2013   OSA (obstructive sleep apnea) 12/08/2013   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/31/2013   Depression with anxiety 08/04/2013   Protein C deficiency ([place]) 08/01/2013   Post traumatic stress disorder (PTSD) 08/01/2013   Speech abnormality 07/25/2013   Obesity 05/10/2013   Lower extremity edema 05/10/2013   GERD (gastroesophageal reflux disease) 05/10/2013   Arthritis 05/10/2013   OA (osteoarthritis) of knee 03/15/2013   Headache 10/25/2012   Palpitations 10/18/2012   Fatty liver determined by biopsy 06/01/2012   Arthropathic psoriasis, unspecified ([place]) 04/29/2012   Abnormal liver enzymes 03/29/2012   Psoriatic arthritis ([place]) 12/29/2011   Left Renal Hydronephrosis 12/11/2010   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     THERAPY DIAG:  Abnormal posture  Cervicalgia  Cramp and spasm  Rationale for Evaluation and Treatment Rehabilitation  PERTINENT HISTORY: Lumbar stenosis L5-S1, failed TKA 2019, neuropathy in bil feet, PTSD, CVA vs TIA Rt weakness, head trauma 2019,  Rt thumb replacement, bil total knee replacements, bil shoulder scope surgeries  PRECAUTIONS: Fall  SUBJECTIVE: Better than last session. Sorry I was late. I put in to get the traction unit for home.   PAIN:  Are you having pain? Neck is just sore not not pain/ NPRS scale: 1-2/10 Pain location: neck Lt>Rt PAIN TYPE: aching Pain description: intermittent and aching  Aggravating factors: nothing specific, at the end of the day from fatigue Relieving factors: traction, rest   OBJECTIVE: (objective measures completed at initial evaluation unless otherwise dated)  DIAGNOSTIC FINDINGS:  MRI on 10/24/21: No interval change of the syrinx (fluid filled cyst) spanning the C4 through C6 levels measuring up to 4 mm in diameter. 2. Mild degenerative changes of the cervical spine without high-grade spinal canal stenosis at any level. 3. Mild to moderate left neural foraminal narrowing at C3-4, unchanged.   PATIENT SURVEYS:  FOTO 36 (52 is goal) 12/12/21: 54-goal met   COGNITION: Overall cognitive status: Within functional limits for tasks assessed SENSATION: WFL   POSTURE: rounded shoulders and forward head   PALPATION: Trigger points in bil suboccipitals, upper traps and tension in bil cervical paraspinals.  Pt with reduced segmental mobility C3-7 and T1-5.  Pain reduction with manual cervical traction in supine.              CERVICAL ROM:    Active ROM A/PROM (deg) eval A/ROM 11/11/21 A/ROM 12/12/21  Flexion 50    Extension 30    Right lateral flexion 35    Left lateral flexion 40    Right rotation 60 65 70  Left rotation 70 80 75   (Blank rows = not tested)   UPPER EXTREMITY ROM:   Bil UE A/ROM limited by 25% in all directions.    UPPER EXTREMITY MMT:   MMT Right eval Left eval  Shoulder flexion 4-/5 4/5  Shoulder extension 4- 4  Shoulder abduction 4- 4-  Shoulder adduction      Shoulder extension      Shoulder internal  rotation 4+ 4+  Shoulder external rotation  4- 4-  Middle trapezius      Lower trapezius      Elbow flexion      Elbow extension      Wrist flexion      Wrist extension      Wrist ulnar deviation      Wrist radial deviation      Wrist pronation      Wrist supination      Grip strength 32 29   (Blank rows = not tested)   TODAY'S TREATMENT:   02/03/22: Arm bike; L2 3x3 with PTA present to monitor and discuss current status.  Bicep Curls 6# 2x10 4# 3 was raise below 90 degrees 12x Educated pt information on going to [employer] for soft tissue maintenance Cervical traction: 18# intermittent 60 second hold, 10 second rest x12 min total  01/29/22: Arm bike; L1.7 4x4 with PTA present to monitor and discuss current status.  Bicep Curls 6# 2x10 4# 3 was raise below 90 degrees 12x Manual therapy: elongation to Rt neck with lateral mobs and rotational mobs   01/27/22: Arm bike; L1.7 4x4 with PTA present to monitor and discuss current status.  Bicep Curls 6# 2x10 Cervical traction: 18# intermittent 60 second hold, 10 second rest x10 4# 3 was raise below 90 degrees 12x   PATIENT EDUCATION:  Education details: Access Code: [email or web address] Person educated: Patient and Parent Education method: Explanation, Demonstration, and Handouts Education comprehension: verbalized understanding and returned demonstration     HOME EXERCISE PROGRAM: Access Code: [email or web address] URL: [email or web address] Date: 10/30/2021 Prepared by: Claiborne Billings   Exercises - Seated Cervical Flexion AROM  - 3 x daily - 7 x weekly - 1 sets - 3 reps - 20 hold - Seated Cervical Sidebending AROM  - 3 x daily - 7 x weekly - 1 sets - 3 reps - 20 hold - Seated Cervical Rotation AROM  - 3 x daily - 7 x weekly - 1 sets - 3 reps - 20 hold - Seated Correct Posture  - 1 x daily - 7 x weekly - 3 sets - 10 reps - Seated Scapular Retraction  - 5 x daily - 7 x weekly - 1 sets - 10 reps - 5 hold   ASSESSMENT:   CLINICAL IMPRESSION:  Pt arrives slightly late but with  more positive reports of his neck (less pain and stiffness) than last session. Pt has call in to complete receiving his home traction unit. He more than likely will use a combination of home traction and massage to help maintain his progress.   OBJECTIVE IMPAIRMENTS decreased mobility, decreased ROM, decreased strength, hypomobility, increased muscle spasms, impaired flexibility, impaired UE functional use, postural dysfunction, and pain.    ACTIVITY LIMITATIONS lifting, sitting, and reach over head   PARTICIPATION LIMITATIONS: cleaning, laundry, and driving   PERSONAL FACTORS 3+ comorbidities: cervical DDD, CVA, bil LE neuropathy,   are also affecting patient's functional outcome.    REHAB POTENTIAL: Good   CLINICAL DECISION MAKING: Evolving/moderate complexity   EVALUATION COMPLEXITY: Moderate   GOALS: Goals reviewed with patient? Yes   SHORT TERM GOALS: Target date: 11/27/2021    Be independent in initial HEP Baseline:  Goal status: Goal met 11/18/21   2.  Verbalize postural modifications to improve alignment and reduce UE symptoms  Baseline:  Goal status: Goal met 11/18/21   3.  Demonstrate Rt cervical A/ROM rotation to > or = to 70 degrees to improve safety  with driving Baseline: 65 Rt, 70 Lt (11/14/21) Goal status: In progress    4.  Report > or = to 30% reduction in neck pain and UE radiculopathy with ADLs and self-care Baseline: 20%  Goal status: 20% 12/05/21   LONG TERM GOALS: Target date: 02/06/22   Be independent in advanced HEP Baseline:  Goal status: In progress    2.  Improve FOTO to > or = to 52  Baseline: 54 (12/12/21) Goal status: MET   3.  Improve bil grip to > or = to 45# to improve functional use and endurance with ADLs and self-care Baseline: 32/29 Goal status: In progress   4.  Report > or = to 60% reduction in neck pain and UE pain with ADLs and self-care  Baseline: 23% (12/12/21) Goal status: revised    5.  Demonstrate > or = to 80 degrees  A/ROM rotation bilaterally to improve safety with driving  Baseline: 27/51 Goal status: in progress        PLAN: PT FREQUENCY: 1-2x/week   PT DURATION: 8 weeks   PLANNED INTERVENTIONS: Therapeutic exercises, Therapeutic activity, Neuromuscular re-education, Balance training, Gait training, Patient/Family education, Self Care, Joint mobilization, Joint manipulation, Aquatic Therapy, Dry Needling, Electrical stimulation, Spinal manipulation, Spinal mobilization, Cryotherapy, Moist heat, Taping, Traction, Ultrasound, Manual therapy, and Re-evaluation   PLAN FOR NEXT SESSION: DC next visit  Myrene Galas, PTA 02/03/22 1:09 PM   [facility] [address] [place], [place] [address] Phone # [phone] Fax [phone]

## 2022-02-05 ENCOUNTER — Encounter: Payer: 59 | Attending: Physical Medicine & Rehabilitation | Admitting: Physical Medicine & Rehabilitation

## 2022-02-05 ENCOUNTER — Encounter: Payer: Self-pay | Admitting: Physical Medicine & Rehabilitation

## 2022-02-05 ENCOUNTER — Other Ambulatory Visit (HOSPITAL_BASED_OUTPATIENT_CLINIC_OR_DEPARTMENT_OTHER): Payer: Self-pay

## 2022-02-05 DIAGNOSIS — M47816 Spondylosis without myelopathy or radiculopathy, lumbar region: Secondary | ICD-10-CM | POA: Insufficient documentation

## 2022-02-05 DIAGNOSIS — L4050 Arthropathic psoriasis, unspecified: Secondary | ICD-10-CM | POA: Diagnosis present

## 2022-02-05 MED ORDER — OXYCODONE HCL 10 MG PO TABS
10.0000 mg | ORAL_TABLET | Freq: Three times a day (TID) | ORAL | 0 refills | Status: DC | PRN
  Filled 2022-02-05: qty 70, 24d supply, fill #0

## 2022-02-05 MED ORDER — MORPHINE SULFATE ER 30 MG PO TBCR
30.0000 mg | EXTENDED_RELEASE_TABLET | Freq: Two times a day (BID) | ORAL | 0 refills | Status: DC
  Filled 2022-02-05 – 2022-03-03 (×2): qty 60, 30d supply, fill #0

## 2022-02-05 NOTE — Progress Notes (Signed)
Subjective:    Patient ID: Jesse Bowers, male    DOB: [birth year], 56 y.o.   MRN: [id number]  HPI  Jesse Bowers is here in follow up of his chronic pain and gait disorder. He had his LE NCS on 11/13 which did not demonstrate any signs of radiculopathy. There were some smaller fiber abnormalities in the right foot seen which may explain some of the numbness in his foot.   He has been trying to stay as active as he can, trying to lose weight. He has a CPAP unit but can't find a mask which works for him. He's been trying again with his unit for the past year.   He donated a unit of blood for his polycythemia recently.   He stretches every day, does yoga, and has been doing therapy. He is finishing therapy and is waiting on a traction collar to use at home. He had good results with traction.   His left knee has been acting up over the last month or so. He has been walking a bit more since his w/c is off line   Pain Inventory Average Pain 7 Pain Right Now 5 My pain is intermittent, sharp, burning, stabbing, tingling, and aching  LOCATION OF PAIN  Neck, Elbow, Wrist, Fingers, Back, Hip, Knee, Ankle, Toes  BOWEL Number of stools per week: 5   BLADDER Normal    Mobility walk with assistance use a walker ability to climb steps?  yes do you drive?  yes use a wheelchair Do you have any goals in this area?  yes  Function disabled: date disabled 10/14/2012  Neuro/Psych No problems in this area  Prior Studies Any changes since last visit?  no  Physicians involved in your care Any changes since last visit?  no   Family History  Problem Relation Age of Onset   Stroke Maternal Grandfather        66   Heart attack Maternal Grandfather    Glaucoma Maternal Grandfather    Macular degeneration Maternal Grandfather    Breast cancer Sister    Hypertension Mother    Psoriasis Mother    Other Mother        meningioma developed ~2019   Glaucoma Mother    Cancer Paternal Grandfather     Heart attack Paternal Grandfather    Stroke Paternal Uncle        age 22   Polycythemia Paternal Uncle    Stroke Maternal Grandmother    Congestive Heart Failure Maternal Grandmother    Heart attack Maternal Grandmother    Protein C deficiency Sister 76       Miscarriages   Breast cancer Maternal Aunt 81   Social History   Socioeconomic History   Marital status: Married    Spouse name: Not on file   Number of children: 2   Years of education: 4y college   Highest education level: Not on file  Occupational History   Occupation: Pediatric Nurse practitioner    Comment: Not working since CVA 2015  Tobacco Use   Smoking status: Never   Smokeless tobacco: Never  Vaping Use   Vaping Use: Never used  Substance and Sexual Activity   Alcohol use: Yes    Comment: social   Drug use: No   Sexual activity: Yes    Birth control/protection: None    Comment: patient is a transgender on testosterone shots, no biological kids  Other Topics Concern   Not on file  Social History Narrative  Education 4 year college, former Therapist, sports X 15 years, pediatric nurse practitioner x 6 years, did NP degree from [employer] [place]. Relocated to [place] about 2 months ago from [place], [place]. Patient was in [place] for last 4 years and prior to that in [place]. His wife is working as Scientist, research (physical sciences) for [employer]. Patient is not working and applying for disability. They have 2 kids but no biologic children.    Social Determinants of Health   Financial Resource Strain: Not on file  Food Insecurity: Not on file  Transportation Needs: Not on file  Physical Activity: Not on file  Stress: Not on file  Social Connections: Not on file   Past Surgical History:  Procedure Laterality Date   ABDOMINAL HYSTERECTOMY Bilateral 1994   TAH, BSO- tranverse incision at 56 yo   ANKLE ARTHROSCOPY WITH RECONSTRUCTION Right 2007   CHOLECYSTECTOMY     laparoscopic   COLONOSCOPY     x3   EYE SURGERY     Left eye  03/02/2018, right 02/15/2018   HIP ARTHROSCOPY W/ LABRAL REPAIR Right 05/11/2013   acetabular labral tear 03/30/2013   KNEE ARTHROPLASTY Right    KNEE JOINT MANIPULATION Left    x3 under anesthesia   KNEE SURGERY Bilateral 1984   Right ACL, left PCL repair   LITHOTRIPSY  2005   LIVER BIOPSY  2013   normal results.   MASTECTOMY Bilateral    prior to 2009   MOUTH SURGERY     NASAL SEPTUM SURGERY N/A 09/20/2015   by ENT Dr. [name]   OVARIAN CYST SURGERY Left    size of grapefruit, was informed that she had shortened vagina   SHOULDER SURGERY Bilateral    Right 08/15/2016, Left 11/15/2016   THUMB ARTHROSCOPY Left    THYROIDECTOMY, PARTIAL Left 2008   TOTAL KNEE ARTHROPLASTY Right 08/23/2018   Procedure: TOTAL KNEE ARTHROPLASTY;  Surgeon: [name], MD;  Location: [facility];  Service: Orthopedics;  Laterality: Right;  27mn   TOTAL KNEE REVISION Left 02/06/2016   Procedure: LEFT TOTAL KNEE REVISION;  Surgeon: FGaynelle Arabian MD;  Location: [facility];  Service: Orthopedics;  Laterality: Left;   TOTAL KNEE REVISION Left 04/22/2017   Procedure: Left knee polyethylene revision;  Surgeon: AGaynelle Arabian MD;  Location: [facility];  Service: Orthopedics;  Laterality: Left;   UPPER GI ENDOSCOPY  2003   Past Medical History:  Diagnosis Date   Abnormal weight loss    Anxiety    Arthritis    Cataract    OU   Celiac disease    Cervical neck pain with evidence of disc disease    patient has a cyst    Chronic constipation    Chronic diastolic heart failure (HCC)    Pt. denies   Chronic pain    Degenerative disc disease at L5-S1 level    with stenosis   DVT (deep venous thrombosis) (HCC)    Right upper arm, bilateral leg   Eczema    inguinal, feet   Elevated liver enzymes    Failed total knee arthroplasty ([facility] 04/22/2017   Family history of adverse reaction to anesthesia    family has problems with anesthesia of nausea and vomiting    GERD (gastroesophageal reflux disease)    History of in  20's   Gluten enteropathy    H/O parotitis    right    Hard of hearing    History of kidney stones    History of retinal tear  Bilateral   History of staph infection    required wound vac   Hx-TIA (transient ischemic attack)    2015   Kidney stones 08/2020   LVH (left ventricular hypertrophy) 12/15/2016   Mild, noted on ECHO   MVP (mitral valve prolapse)    NAFL (nonalcoholic fatty liver)    Neck pain    Neuromuscular disorder ([place])    bilateral neuropathy feet.   Nuclear sclerotic cataract of both eyes 09/08/2019   Pneumonia 12/17/2010   Polycythemia    Polycythemia, secondary    PONV (postoperative nausea and vomiting)    Protein C deficiency (HCC)    Dr. Anne Fu   Psoriasis    16 X10 cm psoriatic rash on sole of left foot ; open and occ scant bleeding;    psoriatic arthritis    PTSD (post-traumatic stress disorder)    Scaphoid fracture of wrist 09/23/2013   Seizure ([place])    childhood, medication until age 11 then weanned completely off   Sleep apnea    split night study last done by Dr. [name] 06/18/15 shows severe OSA, CSA, and hypersomnia, rec bipap   Splenomegaly    Stenosis of ureteropelvic junction (UPJ)    left   Stroke [facility])    CVA vs TIA in left cerebrum causing slight right sided weakness-Dr. 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  All other systems reviewed and are negative.     Objective:   Physical Exam  General: No acute distress HEENT: NCAT, EOMI, oral membranes moist Cards: reg rate  Chest: normal effort Abdomen: Soft, NT, ND Skin: dry, intact Extremities: no edema Psych: pleasant and appropriate  Skin: intact Neuro: Alert and oriented x 3. Normal insight and awareness. Intact Memory. Normal language and speech. Cranial nerve exam unremarkable. LE motor 5/5 LLE and 4-5/ RLe with more weakness in ADF than anywhere else. He has stocking glove sensory loss in both feet below ankles as well as sensory loss along left lateral leg. Right 2-4 toes more affected. Tends to land farther ahead with left foot than right. Some valgus at left knee, steppage pattern with right knee.  DTR's 1+ no resting tone Musculoskeletal:  full knee rom. Left knee valgus, mild crepitus  Assessment & Plan:  1. Psoriatic arthritis with pain in multiple areas, most prominently feet, hands, elbows. His pain is also related to his prior CVA and associated motor/sensory change   2. 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 3. Hx of left total knee revision again on 04/21/17 per [facility] and right TKA 08/2018 4. Chronic  low back pain---MRI with severe DDD at L5-S1. Left S1 radiculopathy? only mild foraminal stenosis however -s/p ESI 12/21/20  5. Protein C deficiency   6. Left shoulder subluxation, bicipital tendonitis 7. Central sleep apnea 8. Polycythemia 9. Depression with anxiety.  0. Left lateral epidondylitis  11. 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  now having right and left sided weakness with increased sensory loss in C4-6  dermatomes.. Most recent cervical MRI (07/08/17) without significant change 12. 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 [place]oga 14. Right wrist psoriatic arthritis, tendonapthy, ganglion cysts s/p multiple recent surgeries.  s/p right thumb replacement 15. Recent gender reconstruction surgery     Plan:  1. Continue venlafaxine per pyschiatry 2. MTX, etc per rheum 3. Voltaren gel to hands, feet, knees, elbows--- continue 4. Baclofen for lower extermity spasms/back pain. increase to 68m 5x daily, taking 231mtid and 4074mt bedtime 5. continue Ms Contin to 57m92m #60 and  oxycodone 10mg1m prn #70              -plan will ultimately to move his oxycodone to #60  We will continue the controlled substance monitoring program, this consists of regular clinic visits, examinations, routine drug screening, pill counts as well as use of [place]. [place].  .   6. MRI of cervical spine to assess C4-6 reviewed and without acute changes including area of syrinx         -continue PT -might do well with home traction unit -HEP  7. fair result with latest ESI        -consider f/u ESI at some point 8: OI constipation  -linzess  9. ?Qutenza candidate for ?peripheral neuropathy. 10. Discussed gait quality. Recommended working on gait with mirror or treadmill. Needs to achieve better symmetry with ambulation. Weight loss should also help   20 minutes of teletime was spent during this visit. All questions were encouraged and answered.  Follow up with me in 2 mos .

## 2022-02-05 NOTE — Patient Instructions (Signed)
ALWAYS FEEL FREE TO CALL OUR OFFICE WITH ANY PROBLEMS OR QUESTIONS ([phone])  **PLEASE NOTE** ALL MEDICATION REFILL REQUESTS (INCLUDING CONTROLLED SUBSTANCES) NEED TO BE MADE AT LEAST 7 DAYS PRIOR TO REFILL BEING DUE. ANY REFILL REQUESTS INSIDE THAT TIME FRAME MAY RESULT IN DELAYS IN RECEIVING YOUR PRESCRIPTION.

## 2022-02-06 ENCOUNTER — Ambulatory Visit: Payer: 59

## 2022-02-06 DIAGNOSIS — R293 Abnormal posture: Secondary | ICD-10-CM | POA: Diagnosis not present

## 2022-02-06 DIAGNOSIS — M542 Cervicalgia: Secondary | ICD-10-CM

## 2022-02-06 DIAGNOSIS — R252 Cramp and spasm: Secondary | ICD-10-CM

## 2022-02-06 NOTE — Therapy (Signed)
OUTPATIENT PHYSICAL THERAPY TREATMENT NOTE   Patient Name: Jesse Bowers MRN: [id number] DOB:1965/05/08, 56 y.o., adult Today's Date: 02/06/2022  PCP: NA-pt reports that he does not currently have a PCP REFERRING PROVIDER: Alger Simons, MD  END OF SESSION:   PT End of Session - 02/06/22 1249     Visit Number 57    PT Start Time 1246   late   PT Stop Time 46    PT Time Calculation (min) 32 min    Activity Tolerance Patient tolerated treatment well    Behavior During Therapy WFL for tasks assessed/performed                             Past Medical History:  Diagnosis Date   Abnormal weight loss    Anxiety    Arthritis    Cataract    OU   Celiac disease    Cervical neck pain with evidence of disc disease    patient has a cyst    Chronic constipation    Chronic diastolic heart failure ([place])    Pt. 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   Family history of adverse reaction to anesthesia    family has problems with anesthesia of nausea and vomiting    GERD (gastroesophageal reflux disease)    History of in 20's   Gluten enteropathy    H/O parotitis    right    Hard of hearing    History of kidney stones    History of retinal tear    Bilateral   History of staph infection    required wound vac   Hx-TIA (transient ischemic attack)    2015   Kidney stones 08/2020   LVH (left ventricular hypertrophy) 12/15/2016   Mild, noted on ECHO   MVP (mitral valve prolapse)    NAFL (nonalcoholic fatty liver)    Neck pain    Neuromuscular disorder ([place])    bilateral neuropathy feet.   Nuclear sclerotic cataract of both eyes 09/08/2019   Pneumonia 12/17/2010   Polycythemia    Polycythemia, secondary    PONV (postoperative nausea and  vomiting)    Protein C deficiency (HCC)    Dr. Anne Fu   Psoriasis    16 X10 cm psoriatic rash on sole of left foot ; open and occ scant bleeding;    psoriatic arthritis    PTSD (post-traumatic stress disorder)    Scaphoid fracture of wrist 09/23/2013   Seizure ([place])    childhood, medication until age 48 then weanned completely off   Sleep apnea    split night study last done by Dr. [name] 06/18/15 shows severe OSA, CSA, and hypersomnia, rec bipap   Splenomegaly    Stenosis of ureteropelvic junction (UPJ)    left   Stroke [facility])    CVA vs TIA in left cerebrum causing slight right sided weakness-Dr. [name] follows   Syrinx of spinal cord ([place]) 01/06/2014   c spine on MRI   Tachycardia    hx of    Transfusion history    past history- none recent, after surgeries due to blood loss   Transgender with history of gender  affirmation surgery    Wears glasses    Wears hearing aid    Past Surgical History:  Procedure Laterality Date   ABDOMINAL HYSTERECTOMY Bilateral 1994   TAH, BSO- tranverse incision at 56 yo   ANKLE ARTHROSCOPY WITH RECONSTRUCTION Right 2007   CHOLECYSTECTOMY     laparoscopic   COLONOSCOPY     x3   EYE SURGERY     Left eye 03/02/2018, right 02/15/2018   HIP ARTHROSCOPY W/ LABRAL REPAIR Right 05/11/2013   acetabular labral tear 03/30/2013   KNEE ARTHROPLASTY Right    KNEE JOINT MANIPULATION Left    x3 under anesthesia   KNEE SURGERY Bilateral 1984   Right ACL, left PCL repair   LITHOTRIPSY  2005   LIVER BIOPSY  2013   normal results.   MASTECTOMY Bilateral    prior to 2009   MOUTH SURGERY     NASAL SEPTUM SURGERY N/A 09/20/2015   by ENT Dr. [name]   OVARIAN CYST SURGERY Left    size of grapefruit, was informed that she had shortened vagina   SHOULDER SURGERY Bilateral    Right 08/15/2016, Left 11/15/2016   THUMB ARTHROSCOPY Left    THYROIDECTOMY, PARTIAL Left 2008   TOTAL KNEE ARTHROPLASTY Right 08/23/2018   Procedure: TOTAL KNEE ARTHROPLASTY;   Surgeon: [name], MD;  Location: [facility];  Service: Orthopedics;  Laterality: Right;  40mn   TOTAL KNEE REVISION Left 02/06/2016   Procedure: LEFT TOTAL KNEE REVISION;  Surgeon: FGaynelle Arabian MD;  Location: [facility];  Service: Orthopedics;  Laterality: Left;   TOTAL KNEE REVISION Left 04/22/2017   Procedure: Left knee polyethylene revision;  Surgeon: AGaynelle Arabian MD;  Location: [facility];  Service: Orthopedics;  Laterality: Left;   UPPER GI ENDOSCOPY  2003   Patient Active Problem List   Diagnosis Date Noted   Posterior vitreous detachment of right eye 11/18/2021   Spondylosis of lumbar spine 10/03/2020   Pseudophakia of both eyes 05/07/2020   Retinal telangiectasis of both eyes 05/07/2020   Lattice degeneration of peripheral retina, left 09/08/2019   Lattice degeneration, right eye 09/08/2019   Chronic diastolic heart failure ([place] 05/24/2019   Urinary dysfunction 04/12/2019   Osteoarthritis of right knee 08/23/2018   Constipation due to opioid therapy 03/30/2018   SNHL (sensorineural hearing loss) 12/04/2017   Abnormal urinary stream 12/03/2017   Osteoarthritis of carpometacarpal (CMC) joint of thumb 11/30/2017   Muscle weakness 11/17/2017   Transient vision disturbance 11/12/2017   Bilateral hand pain 10/30/2017   Pain of left hip joint 10/09/2017   Gynecomastia 07/10/2017   Iron deficiency anemia 07/05/2017   Spasticity 05/20/2017   Lumbar radiculitis 04/20/2017   Ulnar 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, left 11/28/2016   Idiopathic peripheral neuropathy 11/28/2016   Failed total knee arthroplasty, 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   Long term (current) use of anticoagulants 08/23/2015   Right upper quadrant abdominal pain 08/23/2015   Memory loss 05/10/2015   Gait abnormality 04/07/2015   Alkaline phosphatase elevation 04/07/2015   History of thrombosis 03/26/2015   Medial epicondylitis 02/07/2015   Cognitive decline 12/21/2014   Leukopenia 12/05/2014   Rotator cuff syndrome of right shoulder  10/27/2014   Status post left knee replacement 08/22/2014   Left lateral epicondylitis 08/22/2014   Chronic cerebral ischemia 08/18/2014   Arthrofibrosis of knee joint 08/17/2014   Cubital canal compression syndrome, left 08/17/2014   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   Chronic pain syndrome 04/10/2014   Insomnia 04/10/2014   Chronic non-specific white matter lesions on  MRI 04/10/2014   CFS (chronic fatigue syndrome) 04/10/2014   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[place]) 03/01/2014   Biceps tendonitis on left 03/01/2014   Polycythemia vera ([place]) 12/27/2013   H/O TIA (transient ischemic attack) and stroke 12/27/2013   Neck pain 12/27/2013   OSA (obstructive sleep apnea) 12/08/2013   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/31/2013   Depression with anxiety 08/04/2013   Protein C deficiency ([place]) 08/01/2013   Post traumatic stress disorder (PTSD) 08/01/2013   Speech abnormality 07/25/2013   Obesity 05/10/2013   Lower extremity edema 05/10/2013   GERD (gastroesophageal reflux disease) 05/10/2013   Arthritis 05/10/2013   OA (osteoarthritis) of knee 03/15/2013   Headache 10/25/2012   Palpitations 10/18/2012   Fatty liver determined by biopsy 06/01/2012   Arthropathic psoriasis, unspecified ([place]) 04/29/2012   Abnormal liver enzymes 03/29/2012   Psoriatic arthritis ([place]) 12/29/2011   Left Renal Hydronephrosis 12/11/2010   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     THERAPY DIAG:  Abnormal posture  Cervicalgia  Cramp and spasm  Rationale for Evaluation and Treatment Rehabilitation  PERTINENT HISTORY: Lumbar stenosis L5-S1, failed TKA 2019, neuropathy in bil feet, PTSD, CVA vs TIA Rt weakness, head trauma 2019, Rt thumb replacement, bil total knee replacements, bil shoulder scope  surgeries  PRECAUTIONS: Fall  SUBJECTIVE: I will get a home traction unit soon. I am better overall with less pain and improved flexibility.   PAIN:  Are you having pain? Neck is just sore not not pain NPRS scale: 2-3/10 Pain location: neck Lt>Rt PAIN TYPE: aching Pain description: intermittent and aching  Aggravating factors: nothing specific, at the end of the day from fatigue Relieving factors: traction, rest   OBJECTIVE: (objective measures completed at initial evaluation unless otherwise dated)  DIAGNOSTIC FINDINGS:  MRI on 10/24/21: No interval change of the syrinx (fluid filled cyst) spanning the C4 through C6 levels measuring up to 4 mm in diameter. 2. Mild degenerative changes of the cervical spine without high-grade spinal canal stenosis at any level. 3. Mild to moderate left neural foraminal narrowing at C3-4, unchanged.   PATIENT SURVEYS:  FOTO 36 (52 is goal) 12/12/21: 54-goal met   COGNITION: Overall cognitive status: Within functional limits for tasks assessed SENSATION: WFL   POSTURE: rounded shoulders and forward head   PALPATION: Trigger points in bil suboccipitals, upper traps and tension in bil cervical paraspinals.  Pt with reduced segmental mobility C3-7 and T1-5.  Pain reduction with manual cervical traction in supine.              CERVICAL ROM:    Active ROM A/PROM (deg) eval A/ROM 11/11/21 A/ROM 12/12/21  Flexion 50    Extension 30    Right lateral flexion 35    Left lateral flexion 40    Right rotation 60 65 70  Left rotation 70 80 75   (Blank rows = not tested)   UPPER EXTREMITY ROM:   Bil UE A/ROM limited by 25% in all directions.    UPPER EXTREMITY MMT:   MMT Right eval Left eval  Shoulder flexion 4-/5 4/5  Shoulder extension 4- 4  Shoulder abduction 4- 4-  Shoulder adduction      Shoulder extension      Shoulder internal rotation 4+ 4+  Shoulder external rotation 4- 4-  Middle trapezius  Lower trapezius      Elbow  flexion      Elbow extension      Wrist flexion      Wrist extension      Wrist ulnar deviation      Wrist radial deviation      Wrist pronation      Wrist supination      Grip strength 32 29   (Blank rows = not tested)  02/06/22: grip strength Rt 37, Lt 38 TODAY'S TREATMENT:  02/06/22: Arm bike; L2 3x3 with PTA present to monitor and discuss current status.  Bicep Curls 6# 2x10 4# 3 was raise below 90 degrees 12x  Cervical traction: 18# intermittent 60 second hold, 10 second rest x12 min total 02/03/22: Arm bike; L2 3x3 with PTA present to monitor and discuss current status.  Bicep Curls 6# 2x10 4# 3 was raise below 90 degrees 12x Educated pt information on going to [employer] for soft tissue maintenance Cervical traction: 18# intermittent 60 second hold, 10 second rest x12 min total  01/29/22: Arm bike; L1.7 4x4 with PTA present to monitor and discuss current status.  Bicep Curls 6# 2x10 4# 3 was raise below 90 degrees 12x Manual therapy: elongation to Rt neck with lateral mobs and rotational mobs    PATIENT EDUCATION:  Education details: Access Code: [email or web address] Person educated: Patient and Parent Education method: Explanation, Media planner, and Handouts Education comprehension: verbalized understanding and returned demonstration     HOME EXERCISE PROGRAM: Access Code: [email or web address] URL: [email or web address] Date: 10/30/2021 Prepared by: Claiborne Billings   Exercises - Seated Cervical Flexion AROM  - 3 x daily - 7 x weekly - 1 sets - 3 reps - 20 hold - Seated Cervical Sidebending AROM  - 3 x daily - 7 x weekly - 1 sets - 3 reps - 20 hold - Seated Cervical Rotation AROM  - 3 x daily - 7 x weekly - 1 sets - 3 reps - 20 hold - Seated Correct Posture  - 1 x daily - 7 x weekly - 3 sets - 10 reps - Seated Scapular Retraction  - 5 x daily - 7 x weekly - 1 sets - 10 reps - 5 hold   ASSESSMENT:   CLINICAL IMPRESSION:  Pt reports 60% overall improvement in symptoms since  the start of care.  Pt reports reduced pain overall and improved flexibility overall.  Pt will get a home cervical traction unit soon. Grip strength is improved and equal in Rt and Lt. Postural endurance and alignment is improved.  Cervical A/ROM is improved overall and still limited to the Rt with some pain.  Pt reports that he is able to use his arms to use handheld shower attachment with independence. He is able to put away dishes with increased ease.   Pt will D/C to PT today.   OBJECTIVE IMPAIRMENTS decreased mobility, decreased ROM, decreased strength, hypomobility, increased muscle spasms, impaired flexibility, impaired UE functional use, postural dysfunction, and pain.    ACTIVITY LIMITATIONS lifting, sitting, and reach over head   PARTICIPATION LIMITATIONS: cleaning, laundry, and driving   PERSONAL FACTORS 3+ comorbidities: cervical DDD, CVA, bil LE neuropathy,   are also affecting patient's functional outcome.    REHAB POTENTIAL: Good   CLINICAL DECISION MAKING: Evolving/moderate complexity   EVALUATION COMPLEXITY: Moderate   GOALS: Goals reviewed with patient? Yes   SHORT TERM GOALS: Target date: 11/27/2021    Be independent in initial HEP Baseline:  Goal status: Goal met 11/18/21  2.  Verbalize postural modifications to improve alignment and reduce UE symptoms  Baseline:  Goal status: Goal met 11/18/21   3.  Demonstrate Rt cervical A/ROM rotation to > or = to 70 degrees to improve safety with driving Baseline: 65 Rt, 70 Lt (11/14/21) Goal status: In progress    4.  Report > or = to 30% reduction in neck pain and UE radiculopathy with ADLs and self-care Baseline: 20%  Goal status: 20% 12/05/21   LONG TERM GOALS: Target date: 02/06/22   Be independent in advanced HEP Baseline:  Goal status: MET    2.  Improve FOTO to > or = to 52  Baseline: 54 (12/12/21) Goal status: MET   3.  Improve bil grip to > or = to 45# to improve functional use and endurance with ADLs and  self-care Baseline: 38# Lt, 37# Rt Goal status: partially met   4.  Report > or = to 60% reduction in neck pain and UE pain with ADLs and self-care  Baseline: 60% (02/06/22) Goal status: MET   5.  Demonstrate > or = to 80 degrees A/ROM rotation bilaterally to improve safety with driving  Baseline: 27/74 Goal status: partially met       PLAN: D/C PT to HEP PHYSICAL THERAPY DISCHARGE SUMMARY  Visits from Start of Care: 18  Current functional level related to goals / functional outcomes: See above for all objective measures.    Remaining deficits: Some neck pain that is intermittent.  70% overall improvement since the start of care   Education / Equipment: HEP, home traction, posture    Patient agrees to discharge. Patient goals were partially met. Patient is being discharged due to being pleased with the current functional level.  Sigurd Sos, PT 02/06/22 1:14 PM   [facility] [address] [place], [place] [address] Phone # [phone] Fax [phone]

## 2022-02-07 ENCOUNTER — Other Ambulatory Visit (HOSPITAL_COMMUNITY): Payer: Self-pay

## 2022-02-08 ENCOUNTER — Other Ambulatory Visit (HOSPITAL_BASED_OUTPATIENT_CLINIC_OR_DEPARTMENT_OTHER): Payer: Self-pay

## 2022-02-08 ENCOUNTER — Other Ambulatory Visit: Payer: Self-pay

## 2022-02-10 ENCOUNTER — Other Ambulatory Visit: Payer: Self-pay

## 2022-02-10 ENCOUNTER — Other Ambulatory Visit (HOSPITAL_BASED_OUTPATIENT_CLINIC_OR_DEPARTMENT_OTHER): Payer: Self-pay

## 2022-02-10 ENCOUNTER — Other Ambulatory Visit (HOSPITAL_COMMUNITY): Payer: Self-pay

## 2022-02-11 ENCOUNTER — Other Ambulatory Visit (HOSPITAL_BASED_OUTPATIENT_CLINIC_OR_DEPARTMENT_OTHER): Payer: Self-pay

## 2022-02-11 MED ORDER — TESTOSTERONE CYPIONATE 200 MG/ML IM SOLN
INTRAMUSCULAR | 1 refills | Status: DC
  Filled 2022-04-22: qty 8, 56d supply, fill #0
  Filled 2022-06-18: qty 8, 56d supply, fill #1

## 2022-02-15 ENCOUNTER — Other Ambulatory Visit (HOSPITAL_BASED_OUTPATIENT_CLINIC_OR_DEPARTMENT_OTHER): Payer: Self-pay

## 2022-02-15 MED FILL — Potassium Chloride Tab ER 10 mEq: ORAL | 90 days supply | Qty: 180 | Fill #1 | Status: AC

## 2022-02-17 ENCOUNTER — Other Ambulatory Visit (HOSPITAL_BASED_OUTPATIENT_CLINIC_OR_DEPARTMENT_OTHER): Payer: Self-pay

## 2022-02-17 ENCOUNTER — Other Ambulatory Visit: Payer: Self-pay

## 2022-02-18 ENCOUNTER — Other Ambulatory Visit (HOSPITAL_BASED_OUTPATIENT_CLINIC_OR_DEPARTMENT_OTHER): Payer: Self-pay

## 2022-02-18 MED ORDER — IBSRELA 50 MG PO TABS
50.0000 mg | ORAL_TABLET | Freq: Two times a day (BID) | ORAL | 1 refills | Status: DC
  Filled 2022-02-18 – 2022-02-21 (×3): qty 180, 90d supply, fill #0
  Filled 2022-03-03: qty 180, fill #0
  Filled 2022-03-05 – 2022-03-24 (×4): qty 180, 90d supply, fill #0
  Filled 2022-06-18: qty 180, 90d supply, fill #1

## 2022-02-19 ENCOUNTER — Other Ambulatory Visit (HOSPITAL_BASED_OUTPATIENT_CLINIC_OR_DEPARTMENT_OTHER): Payer: Self-pay

## 2022-02-19 ENCOUNTER — Other Ambulatory Visit: Payer: Self-pay

## 2022-02-19 MED ORDER — DESVENLAFAXINE SUCCINATE ER 100 MG PO TB24
100.0000 mg | ORAL_TABLET | Freq: Every day | ORAL | 1 refills | Status: DC
  Filled 2022-02-19 – 2022-03-18 (×2): qty 90, 90d supply, fill #0
  Filled 2022-06-18: qty 90, 90d supply, fill #1

## 2022-02-19 MED ORDER — HYDROXYZINE HCL 25 MG PO TABS
25.0000 mg | ORAL_TABLET | Freq: Three times a day (TID) | ORAL | 1 refills | Status: DC
  Filled 2022-02-19 – 2022-03-06 (×5): qty 270, 90d supply, fill #0
  Filled 2022-05-28: qty 270, 90d supply, fill #1

## 2022-02-19 MED ORDER — DESVENLAFAXINE SUCCINATE ER 50 MG PO TB24
50.0000 mg | ORAL_TABLET | Freq: Every day | ORAL | 1 refills | Status: DC
  Filled 2022-02-19: qty 90, 90d supply, fill #0
  Filled 2022-05-11: qty 90, 90d supply, fill #1

## 2022-02-21 ENCOUNTER — Other Ambulatory Visit: Payer: Self-pay

## 2022-02-21 ENCOUNTER — Other Ambulatory Visit (HOSPITAL_BASED_OUTPATIENT_CLINIC_OR_DEPARTMENT_OTHER): Payer: Self-pay

## 2022-02-21 ENCOUNTER — Other Ambulatory Visit (HOSPITAL_COMMUNITY): Payer: Self-pay

## 2022-02-25 ENCOUNTER — Other Ambulatory Visit (HOSPITAL_COMMUNITY): Payer: Self-pay

## 2022-03-03 ENCOUNTER — Other Ambulatory Visit (HOSPITAL_BASED_OUTPATIENT_CLINIC_OR_DEPARTMENT_OTHER): Payer: Self-pay

## 2022-03-04 ENCOUNTER — Other Ambulatory Visit (HOSPITAL_BASED_OUTPATIENT_CLINIC_OR_DEPARTMENT_OTHER): Payer: Self-pay

## 2022-03-04 ENCOUNTER — Ambulatory Visit (INDEPENDENT_AMBULATORY_CARE_PROVIDER_SITE_OTHER): Payer: Commercial Managed Care - PPO | Admitting: Family Medicine

## 2022-03-04 ENCOUNTER — Encounter: Payer: Self-pay | Admitting: Family Medicine

## 2022-03-04 VITALS — BP 110/82 | HR 104 | Temp 98.5°F | Ht 67.5 in | Wt 205.8 lb

## 2022-03-04 DIAGNOSIS — Z23 Encounter for immunization: Secondary | ICD-10-CM | POA: Diagnosis not present

## 2022-03-04 DIAGNOSIS — H60543 Acute eczematoid otitis externa, bilateral: Secondary | ICD-10-CM

## 2022-03-04 MED ORDER — FLUOCINONIDE EMULSIFIED BASE 0.05 % EX CREA
1.0000 | TOPICAL_CREAM | Freq: Two times a day (BID) | CUTANEOUS | 2 refills | Status: DC
  Filled 2022-03-04: qty 60, 30d supply, fill #0
  Filled 2022-04-09: qty 60, 30d supply, fill #1
  Filled 2022-05-21: qty 60, 30d supply, fill #2

## 2022-03-04 NOTE — Progress Notes (Signed)
New Patient Office Visit  Subjective    Patient ID: Jesse Bowers, adult    DOB: [birth year]  Age: 57 y.o. MRN: [id number]  CC:  Chief Complaint  Patient presents with   Establish Care    HPI Jesse Bowers presents to establish care Patient is reporting  chronic itching in the ear canals, states that he has a long history of psoriatic arthritis and is seeing multiple specialists for these issues. States that his ENT retired, needs new referral. He reports that he has had a long and complicated surgical history as well as medical history due to his severe psoriatic arthritis. I was able to review his progress notes from hie heme/onc, psychiatrist and his endocrinologist.  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   Patient is also transgender, has had both BL mastectomy and gender reassignment surgery.   Outpatient Encounter Medications as of 03/04/2022  Medication Sig   baclofen (LIORESAL) 20 MG tablet Take 1-2 tablets (20-40 mg total) by mouth 4 (four) times daily. 1 tablet with breakfast, lunch, and dinner and 2 tablets at bedtime   cycloSPORINE (RESTASIS) 0.05 % ophthalmic emulsion Instill 1 drop into both eyes twice a day   desonide (DESOWEN) 0.05 % cream 1(one) application(s) topical 2(two) times a day   desvenlafaxine (PRISTIQ) 100 MG 24 hr tablet Take 1 tablet by mouth once daily.   desvenlafaxine (PRISTIQ) 50 MG 24 hr tablet Take 1 tablet by mouth once daily.   fluocinonide-emollient (LIDEX-E) 0.05 % cream Apply 1 Application topically 2 (two) times daily.   folic acid (KP FOLIC ACID) 1 MG tablet Take 1 tablet po every day   furosemide (LASIX) 20 MG tablet Take 1 tablet (20 mg total) by mouth daily as needed for edema.   furosemide (LASIX) 80 MG tablet Take 1 tablet (80 mg total) by mouth daily.   hydrOXYzine (ATARAX) 25 MG tablet Take 1 tablet (25 mg total) by mouth 3  times daily as needed for anxety.   hydrOXYzine (ATARAX) 25 MG tablet Take 1 tablet by mouth 3 times daily.   methotrexate (RHEUMATREX) 2.5 MG tablet Take 6 tablets (15 mg total) by mouth once a week.   morphine (MS CONTIN) 30 MG 12 hr tablet Take 1 tablet (30 mg total) by mouth every 12 (twelve) hours.   Oxycodone HCl 10 MG TABS Take 1 tablet (10 mg total) by mouth every 8 (eight) hours as needed (pain).   potassium chloride (KLOR-CON) 10 MEQ tablet Take 2 tablets by mouth every day.   rivaroxaban (XARELTO) 20 MG TABS tablet Take 1 tablet (20 mg total) by mouth nightly.   Secukinumab (COSENTYX SENSOREADY PEN) 150 MG/ML SOAJ Inject 2 pens subcutaneously every 4 weeks   Tenapanor HCl (IBSRELA) 50 MG TABS Take 1 tablet (50 mg total) by mouth 2 times daily.   Testosterone 25 MG/2.5GM (1%) GEL Apply a pea-sized amount to affected area once daily.   testosterone cypionate (DEPOTESTOSTERONE CYPIONATE) 200 MG/ML injection Inject 0.5 mLs (100 mg total) into the muscle every 7 days.   ursodiol (ACTIGALL) 250 MG tablet Take 1 tablet by mouth at lunch and dinner in addition to ursodiol 500 mg for a total 750 mg dose.   ursodiol (ACTIGALL) 500 MG tablet Take 1 tablet (500 mg) by mouth at lunch and dinner in addition to ursodiol 250 mg for a total 750 mg dose.   Vitamin  D, Ergocalciferol, (DRISDOL) 1.25 MG (50000 UNIT) CAPS capsule Take 1 capsule (50,000 Units total) by mouth once a week.   metoprolol tartrate (LOPRESSOR) 25 MG tablet Take 1 tablet (25 mg total) by mouth 2 (two) times daily as needed.   [DISCONTINUED] Tenapanor HCl (IBSRELA) 50 MG TABS Take 1 tablet (50 mg total) by mouth 2 times daily.   No facility-administered encounter medications on file as of 03/04/2022.    Past Medical History:  Diagnosis Date   Abnormal weight loss    Anxiety    Arthritis    Cataract    OU   Celiac disease    Cervical neck pain with evidence of disc disease    patient has a cyst    Chronic constipation     Chronic diastolic heart failure (HCC)    Pt. 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   Family history of adverse reaction to anesthesia    family has problems with anesthesia of nausea and vomiting    GERD (gastroesophageal reflux disease)    History of in 20's   Gluten enteropathy    H/O parotitis    right    Hard of hearing    History of kidney stones    History of retinal tear    Bilateral   History of staph infection    required wound vac   Hx-TIA (transient ischemic attack)    2015   Kidney stones 08/2020   LVH (left ventricular hypertrophy) 12/15/2016   Mild, noted on ECHO   MVP (mitral valve prolapse)    NAFL (nonalcoholic fatty liver)    Neck pain    Neuromuscular disorder ([place])    bilateral neuropathy feet.   Nuclear sclerotic cataract of both eyes 09/08/2019   Pneumonia 12/17/2010   Polycythemia    Polycythemia, secondary    PONV (postoperative nausea and vomiting)    Protein C deficiency (HCC)    Dr. Anne Fu   Psoriasis    16 X10 cm psoriatic rash on sole of left foot ; open and occ scant bleeding;    psoriatic arthritis    PTSD (post-traumatic stress disorder)    Scaphoid fracture of wrist 09/23/2013   Seizure ([place])    childhood, medication until age 51 then weanned completely off   Sleep apnea    split night study last done by Dr. [name] 06/18/15 shows severe OSA, CSA, and hypersomnia, rec bipap   Splenomegaly    Stenosis of ureteropelvic junction (UPJ)    left   Stroke [facility])    CVA vs TIA in left cerebrum causing slight right sided weakness-Dr. [name] follows   Syrinx of spinal cord ([place]) 01/06/2014   c spine on MRI   Tachycardia    hx of    Transfusion history    past history- none recent, after surgeries due to blood loss    Transgender with history of gender affirmation surgery    Wears glasses    Wears hearing aid     Past Surgical History:  Procedure Laterality Date   ABDOMINAL HYSTERECTOMY Bilateral 1994   TAH, BSO- tranverse incision at 57 yo   ANKLE ARTHROSCOPY WITH RECONSTRUCTION Right 2007   CHOLECYSTECTOMY  laparoscopic   COLONOSCOPY     x3   EYE SURGERY     Left eye 03/02/2018, right 02/15/2018   HIP ARTHROSCOPY W/ LABRAL REPAIR Right 05/11/2013   acetabular labral tear 03/30/2013   KNEE ARTHROPLASTY Right    KNEE JOINT MANIPULATION Left    x3 under anesthesia   KNEE SURGERY Bilateral 1984   Right ACL, left PCL repair   LITHOTRIPSY  2005   LIVER BIOPSY  2013   normal results.   MASTECTOMY Bilateral    prior to 2009   MOUTH SURGERY     NASAL SEPTUM SURGERY N/A 09/20/2015   by ENT Dr. [name]   OVARIAN CYST SURGERY Left    size of grapefruit, was informed that she had shortened vagina   SHOULDER SURGERY Bilateral    Right 08/15/2016, Left 11/15/2016   THUMB ARTHROSCOPY Left    THYROIDECTOMY, PARTIAL Left 2008   TOTAL KNEE ARTHROPLASTY Right 08/23/2018   Procedure: TOTAL KNEE ARTHROPLASTY;  Surgeon: [name], MD;  Location: [facility];  Service: Orthopedics;  Laterality: Right;  27mn   TOTAL KNEE REVISION Left 02/06/2016   Procedure: LEFT TOTAL KNEE REVISION;  Surgeon: FGaynelle Arabian MD;  Location: [facility];  Service: Orthopedics;  Laterality: Left;   TOTAL KNEE REVISION Left 04/22/2017   Procedure: Left knee polyethylene revision;  Surgeon: AGaynelle Arabian MD;  Location: [facility];  Service: Orthopedics;  Laterality: Left;   UPPER GI ENDOSCOPY  2003    Family History  Problem Relation Age of Onset   Stroke Maternal Grandfather        570  Heart attack Maternal Grandfather    Glaucoma Maternal Grandfather    Macular degeneration Maternal Grandfather    Breast cancer Sister    Hypertension Mother    Psoriasis Mother    Other Mother        meningioma developed ~2019   Glaucoma  Mother    Cancer Paternal Grandfather    Heart attack Paternal Grandfather    Stroke Paternal Uncle        age 57  Polycythemia Paternal Uncle    Stroke Maternal Grandmother    Congestive Heart Failure Maternal Grandmother    Heart attack Maternal Grandmother    Protein C deficiency Sister 369      Miscarriages   Breast cancer Maternal Aunt 352   Social History   Socioeconomic History   Marital status: Married    Spouse name: Not on file   Number of children: 2   Years of education: 4y college   Highest education level: Not on file  Occupational History   Occupation: Pediatric Nurse practitioner    Comment: Not working since CVA 2015  Tobacco Use   Smoking status: Never   Smokeless tobacco: Never  Vaping Use   Vaping Use: Never used  Substance and Sexual Activity   Alcohol use: Yes    Comment: social   Drug use: No   Sexual activity: Yes    Birth control/protection: None    Comment: patient is a transgender on testosterone shots, no biological kids  Other Topics Concern   Not on file  Social History Narrative   Education 4 year college, former RTherapist, 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 [place] was in [place] 4 years [place] in [place] His wife is working as HR  Mudlogger for [employer]. Patient is not working and applying for disability. They have 2 kids but no biologic children.    Social Determinants of Health   Financial Resource Strain: Not on file  Food Insecurity: Not on file  Transportation Needs: Not on file  Physical Activity: Not on file  Stress: Not on file  Social Connections: Not on file  Intimate Partner Violence: Not on file    Review of Systems  All other systems reviewed and are negative.       Objective    BP 110/82 (BP Location: Left Arm, Patient Position: Sitting, Cuff Size: Large)   Pulse (!) 104    Temp 98.5 F (36.9 C) (Oral)   Ht 5' 7.5" (1.715 m)   Wt 205 lb 12.8 oz (93.4 kg)   SpO2 98%   BMI 31.76 kg/m   Physical Exam Constitutional:      Appearance: Normal appearance. He is normal weight.  HENT:     Right Ear: Tympanic membrane normal.     Left Ear: Tympanic membrane normal.     Ears:     Comments: The EAC showed erythematous skin and some flaking in the EAC BL Cardiovascular:     Rate and Rhythm: Normal rate and regular rhythm.     Pulses: Normal pulses.     Heart sounds: Normal heart sounds.  Pulmonary:     Effort: Pulmonary effort is normal.     Breath sounds: Normal breath sounds. No wheezing or rales.  Abdominal:     General: Bowel sounds are normal.  Neurological:     General: No focal deficit present.     Mental Status: He is alert and oriented to person, place, and time. Mental status is at baseline.  Psychiatric:        Mood and Affect: Mood normal.        Behavior: Behavior normal.        Thought Content: Thought content normal.         Assessment & Plan:   Problem List Items Addressed This Visit   None Visit Diagnoses     Dermatitis of ear canal, bilateral    -  Primary   Relevant Medications   Pt reports that this steroid cream has worked for him in the past, will send in refills of the fluocinomide cream, apply twice daily as needed for ear itching. Will also place a new referral to ENT for this and for his hearing aids.  fluocinonide-emollient (LIDEX-E) 0.05 % cream   Other Relevant Orders   Ambulatory referral to ENT   Immunization due       Relevant Orders   Patient is on methotrexate and also on cosentyx injections for the psoriatic arthritis, this essentially makes him immunocompromised. Will give the prevnar 20 today.  Pneumococcal conjugate vaccine 20-valent (Prevnar 20)       No follow-ups on file.   Farrel Conners, MD

## 2022-03-05 ENCOUNTER — Other Ambulatory Visit: Payer: Self-pay

## 2022-03-05 ENCOUNTER — Other Ambulatory Visit (HOSPITAL_COMMUNITY): Payer: Self-pay

## 2022-03-05 ENCOUNTER — Other Ambulatory Visit (HOSPITAL_BASED_OUTPATIENT_CLINIC_OR_DEPARTMENT_OTHER): Payer: Self-pay

## 2022-03-06 ENCOUNTER — Other Ambulatory Visit: Payer: Self-pay

## 2022-03-06 ENCOUNTER — Encounter (HOSPITAL_BASED_OUTPATIENT_CLINIC_OR_DEPARTMENT_OTHER): Payer: Self-pay | Admitting: Pharmacist

## 2022-03-06 ENCOUNTER — Other Ambulatory Visit (HOSPITAL_BASED_OUTPATIENT_CLINIC_OR_DEPARTMENT_OTHER): Payer: Self-pay

## 2022-03-06 ENCOUNTER — Encounter: Payer: Self-pay | Admitting: Family Medicine

## 2022-03-07 ENCOUNTER — Other Ambulatory Visit: Payer: Self-pay

## 2022-03-07 ENCOUNTER — Other Ambulatory Visit (HOSPITAL_COMMUNITY): Payer: Self-pay

## 2022-03-10 ENCOUNTER — Other Ambulatory Visit: Payer: Self-pay

## 2022-03-10 ENCOUNTER — Other Ambulatory Visit (HOSPITAL_COMMUNITY): Payer: Self-pay

## 2022-03-10 DIAGNOSIS — F411 Generalized anxiety disorder: Secondary | ICD-10-CM | POA: Diagnosis not present

## 2022-03-11 ENCOUNTER — Other Ambulatory Visit (HOSPITAL_COMMUNITY): Payer: Self-pay

## 2022-03-11 ENCOUNTER — Encounter: Payer: Self-pay | Admitting: Family Medicine

## 2022-03-14 DIAGNOSIS — D751 Secondary polycythemia: Secondary | ICD-10-CM | POA: Diagnosis not present

## 2022-03-14 DIAGNOSIS — E611 Iron deficiency: Secondary | ICD-10-CM | POA: Diagnosis not present

## 2022-03-17 DIAGNOSIS — F411 Generalized anxiety disorder: Secondary | ICD-10-CM | POA: Diagnosis not present

## 2022-03-18 ENCOUNTER — Other Ambulatory Visit (HOSPITAL_BASED_OUTPATIENT_CLINIC_OR_DEPARTMENT_OTHER): Payer: Self-pay

## 2022-03-18 ENCOUNTER — Other Ambulatory Visit: Payer: Self-pay

## 2022-03-18 DIAGNOSIS — M180 Bilateral primary osteoarthritis of first carpometacarpal joints: Secondary | ICD-10-CM | POA: Diagnosis not present

## 2022-03-18 DIAGNOSIS — M13841 Other specified arthritis, right hand: Secondary | ICD-10-CM | POA: Diagnosis not present

## 2022-03-19 DIAGNOSIS — G4733 Obstructive sleep apnea (adult) (pediatric): Secondary | ICD-10-CM | POA: Diagnosis not present

## 2022-03-19 DIAGNOSIS — D509 Iron deficiency anemia, unspecified: Secondary | ICD-10-CM | POA: Diagnosis not present

## 2022-03-19 DIAGNOSIS — D6859 Other primary thrombophilia: Secondary | ICD-10-CM | POA: Diagnosis not present

## 2022-03-19 DIAGNOSIS — L4050 Arthropathic psoriasis, unspecified: Secondary | ICD-10-CM | POA: Diagnosis not present

## 2022-03-19 DIAGNOSIS — K760 Fatty (change of) liver, not elsewhere classified: Secondary | ICD-10-CM | POA: Diagnosis not present

## 2022-03-19 DIAGNOSIS — Z7901 Long term (current) use of anticoagulants: Secondary | ICD-10-CM | POA: Diagnosis not present

## 2022-03-19 DIAGNOSIS — D72829 Elevated white blood cell count, unspecified: Secondary | ICD-10-CM | POA: Diagnosis not present

## 2022-03-19 DIAGNOSIS — K900 Celiac disease: Secondary | ICD-10-CM | POA: Diagnosis not present

## 2022-03-19 DIAGNOSIS — R161 Splenomegaly, not elsewhere classified: Secondary | ICD-10-CM | POA: Diagnosis not present

## 2022-03-19 DIAGNOSIS — D751 Secondary polycythemia: Secondary | ICD-10-CM | POA: Diagnosis not present

## 2022-03-19 DIAGNOSIS — R3915 Urgency of urination: Secondary | ICD-10-CM | POA: Diagnosis not present

## 2022-03-21 ENCOUNTER — Encounter: Payer: Self-pay | Admitting: Family Medicine

## 2022-03-24 ENCOUNTER — Other Ambulatory Visit (HOSPITAL_BASED_OUTPATIENT_CLINIC_OR_DEPARTMENT_OTHER): Payer: Self-pay

## 2022-03-25 ENCOUNTER — Other Ambulatory Visit (HOSPITAL_BASED_OUTPATIENT_CLINIC_OR_DEPARTMENT_OTHER): Payer: Self-pay

## 2022-03-27 ENCOUNTER — Ambulatory Visit: Payer: Commercial Managed Care - PPO | Admitting: Family Medicine

## 2022-03-27 ENCOUNTER — Other Ambulatory Visit (HOSPITAL_BASED_OUTPATIENT_CLINIC_OR_DEPARTMENT_OTHER): Payer: Self-pay

## 2022-03-27 ENCOUNTER — Encounter: Payer: Self-pay | Admitting: Family Medicine

## 2022-03-27 VITALS — BP 100/78 | HR 90 | Temp 97.7°F | Ht 67.5 in | Wt 206.3 lb

## 2022-03-27 DIAGNOSIS — N3001 Acute cystitis with hematuria: Secondary | ICD-10-CM

## 2022-03-27 DIAGNOSIS — F321 Major depressive disorder, single episode, moderate: Secondary | ICD-10-CM | POA: Insufficient documentation

## 2022-03-27 DIAGNOSIS — F1114 Opioid abuse with opioid-induced mood disorder: Secondary | ICD-10-CM | POA: Insufficient documentation

## 2022-03-27 DIAGNOSIS — R109 Unspecified abdominal pain: Secondary | ICD-10-CM

## 2022-03-27 DIAGNOSIS — R1011 Right upper quadrant pain: Secondary | ICD-10-CM | POA: Diagnosis not present

## 2022-03-27 LAB — POCT URINALYSIS DIPSTICK
Bilirubin, UA: NEGATIVE
Blood, UA: NEGATIVE
Glucose, UA: NEGATIVE
Ketones, UA: NEGATIVE
Leukocytes, UA: NEGATIVE
Nitrite, UA: NEGATIVE
Protein, UA: NEGATIVE
Spec Grav, UA: 1.015 (ref 1.010–1.025)
Urobilinogen, UA: 0.2 E.U./dL
pH, UA: 6.5 (ref 5.0–8.0)

## 2022-03-27 MED ORDER — CIPROFLOXACIN HCL 250 MG PO TABS
250.0000 mg | ORAL_TABLET | Freq: Two times a day (BID) | ORAL | 0 refills | Status: AC
  Filled 2022-03-27: qty 10, 5d supply, fill #0

## 2022-03-27 NOTE — Progress Notes (Signed)
Established Patient Office Visit  Subjective   Patient ID: Jesse Bowers, adult    DOB: March 12, 1965  Age: 57 y.o. MRN: [id number]  Chief Complaint  Patient presents with   Flank Pain    Patient complains of bilateral flank pain x1 week, states he was seen by the oncologist, was having flank pain, urine tests ordered which was abnormal and states he was advised to return to PCP    Patient is here for a new complaint. He reports that he was seen at his hematologist -- was having bilateral flank pain last week at that appt. Urine tests were performed and showed RBC, WBC, bacteria, and calcium oxalate crystals. Pt reports a history of kidney stones in the past, has not had recent imaging performed. Pt reports sweats and chills, states that he cannot see blood in the urine but can't get a good stream going. Reviewed allergies, patient is allergic to PCN and sulfa. 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    Current Outpatient Medications  Medication Instructions   baclofen (LIORESAL) 20-40 mg, Oral, 4 times daily, 1 tablet with breakfast, lunch, and dinner and 2 tablets at bedtime   ciprofloxacin (CIPRO) 250 mg, Oral, 2 times daily   cycloSPORINE (RESTASIS) 0.05 % ophthalmic emulsion Instill 1 drop into both eyes twice a day   desonide (DESOWEN) 0.05 % cream 1(one) application(s) topical 2(two) times a day   desvenlafaxine (PRISTIQ) 100 MG 24 hr tablet Take 1 tablet by mouth once daily.   desvenlafaxine (PRISTIQ) 50 MG 24 hr tablet Take 1 tablet by mouth once daily.   fluocinonide-emollient (LIDEX-E) 6.28 % cream 1 Application, Topical, 2 times daily   folic acid (KP FOLIC ACID) 1 MG tablet Take 1 tablet po every day   furosemide (LASIX) 80 mg, Oral, Daily   furosemide (LASIX) 20 mg, Oral, Daily PRN   hydrOXYzine (ATARAX) 25 MG tablet Take 1 tablet by mouth 3 times daily.   Ibsrela 50 mg, Oral, 2 times daily    methotrexate (RHEUMATREX) 15 mg, Oral, Weekly   metoprolol tartrate (LOPRESSOR) 25 mg, Oral, 2 times daily PRN   morphine (MS CONTIN) 30 mg, Oral, Every 12 hours   OVER THE COUNTER MEDICATION Tums-'1000mg'$  once a day   Oxycodone HCl 10 mg, Oral, Every 8 hours PRN   potassium chloride (KLOR-CON) 10 MEQ tablet Take 2 tablets by mouth every day.   rivaroxaban (XARELTO) 20 MG TABS tablet Take 1 tablet (20 mg total) by mouth nightly.   Secukinumab (COSENTYX SENSOREADY PEN) 150 MG/ML SOAJ Inject 2 pens subcutaneously every 4 weeks   Testosterone 25 MG/2.5GM (1%) GEL Apply a pea-sized amount to affected area once daily.   testosterone cypionate (DEPOTESTOSTERONE CYPIONATE) 200 MG/ML injection Inject 0.5 mLs (100 mg total) into the muscle every 7 days.   ursodiol (ACTIGALL) 250 MG tablet Take 1 tablet by mouth at lunch and dinner in addition to ursodiol 500 mg for a total 750 mg dose.   ursodiol (ACTIGALL) 500 MG tablet Take 1 tablet (500 mg) by mouth at lunch and dinner in addition to ursodiol 250 mg for a total 750 mg dose.   Vitamin D (Ergocalciferol) (DRISDOL) 50,000 Units, Oral, Weekly     Patient Active Problem List   Diagnosis Date Noted   Opioid abuse with opioid-induced mood disorder ([place]) 03/27/2022   Moderate single current episode of major depressive disorder ([place]) 03/27/2022   Posterior vitreous  detachment of right eye 11/18/2021   Spondylosis of lumbar spine 10/03/2020   Pseudophakia of both eyes 05/07/2020   Retinal telangiectasis of both eyes 05/07/2020   Lattice degeneration of peripheral retina, left 09/08/2019   Lattice degeneration, right eye 09/08/2019   Chronic diastolic heart failure ([place]) 05/24/2019   Urinary dysfunction 04/12/2019   Osteoarthritis of right knee 08/23/2018   Constipation due to opioid therapy 03/30/2018   SNHL (sensorineural hearing loss) 12/04/2017   Abnormal urinary stream 12/03/2017   Osteoarthritis of carpometacarpal (CMC) joint of thumb 11/30/2017    Muscle weakness 11/17/2017   Transient vision disturbance 11/12/2017   Bilateral hand pain 10/30/2017   Pain of left hip joint 10/09/2017   Gynecomastia 07/10/2017   Iron deficiency anemia 07/05/2017   Spasticity 05/20/2017   Lumbar radiculitis 04/20/2017   Ulnar 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, left 11/28/2016   Idiopathic peripheral neuropathy 11/28/2016   Failed total knee arthroplasty, 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   Long term (current) use of anticoagulants 08/23/2015   Right upper quadrant abdominal pain 08/23/2015   Memory loss 05/10/2015   Gait abnormality 04/07/2015   Alkaline phosphatase elevation 04/07/2015   History of thrombosis 03/26/2015   Medial epicondylitis 02/07/2015   Cognitive decline 12/21/2014   Leukopenia 12/05/2014   Rotator cuff syndrome of right shoulder 10/27/2014   Status post left knee replacement 08/22/2014   Left lateral epicondylitis 08/22/2014   Chronic cerebral ischemia 08/18/2014   Arthrofibrosis of knee joint 08/17/2014   Cubital canal compression syndrome, left 08/17/2014   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   Chronic pain syndrome 04/10/2014   Insomnia 04/10/2014   Chronic non-specific white matter lesions on MRI 04/10/2014   CFS (chronic fatigue syndrome) 04/10/2014   Biceps tendonitis on left 03/01/2014   Polycythemia vera ([place]) 12/27/2013   H/O TIA (transient ischemic attack) and stroke 12/27/2013   Neck pain 12/27/2013   OSA (obstructive sleep apnea) 12/08/2013   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/31/2013   Depression with anxiety 08/04/2013   Protein C deficiency ([place]) 08/01/2013   Post traumatic stress disorder (PTSD) 08/01/2013   Speech abnormality 07/25/2013   Obesity 05/10/2013   Lower extremity edema 05/10/2013   GERD (gastroesophageal reflux disease) 05/10/2013   Arthritis 05/10/2013   OA (osteoarthritis) of knee 03/15/2013   Headache 10/25/2012   Palpitations 10/18/2012   Fatty liver determined by biopsy 06/01/2012   Arthropathic psoriasis,  unspecified ([place]) 04/29/2012   Abnormal liver enzymes 03/29/2012   Psoriatic arthritis ([place]) 12/29/2011   Left Renal Hydronephrosis 12/11/2010   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     Objective:     BP 100/78 (BP Location: Left Arm, Patient Position: Sitting, Cuff Size: Large)   Pulse 90   Temp 97.7 F (36.5 C) (Oral)   Ht 5' 7.5" (1.715 m)   Wt 206 lb 4.8 oz (93.6 kg)   SpO2 98%   BMI 31.83 kg/m    Physical Exam Constitutional:      Appearance: Normal appearance.  Pulmonary:     Effort: Pulmonary effort is normal.  Neurological:     General: No focal deficit present.     Mental Status: He is alert and oriented to person, place, and time. Mental status is at baseline.  Psychiatric:        Mood and Affect: Mood normal.  Behavior: Behavior normal.     Results for orders placed or performed in visit on 03/27/22  POC Urinalysis Dipstick  Result Value Ref Range   Color, UA yellow    Clarity, UA cloudy    Glucose, UA Negative Negative   Bilirubin, UA negative    Ketones, UA negative    Spec Grav, UA 1.015 1.010 - 1.025   Blood, UA negative    pH, UA 6.5 5.0 - 8.0   Protein, UA Negative Negative   Urobilinogen, UA 0.2 0.2 or 1.0 E.U./dL   Nitrite, UA negative    Leukocytes, UA Negative Negative   Appearance     Odor        The ASCVD Risk score (Arnett DK, et al., 2019) failed to calculate for the following reasons:   The patient has a prior MI or stroke diagnosis    Assessment & Plan:   Problem List Items Addressed This Visit       Unprioritized   Right upper quadrant abdominal pain   Relevant Orders   CT ABDOMEN PELVIS W WO CONTRAST   Other Visit Diagnoses     Bilateral flank pain    -  Primary   Relevant Orders   POC Urinalysis Dipstick  (Completed)   CT ABDOMEN PELVIS W WO CONTRAST   Acute cystitis with hematuria       Relevant Medications   UA today is negative for blood and LE, however I reviewed the UA from last week which was positive for these things. Given the symptoms that are reported I will treat for a presumed UTI with cipro 250 mg BID for 5 days. I will order a CT abd/pelvis with and without contrast to rule out any obstructions, stones or other issues. Also sending urine for culture.  ciprofloxacin (CIPRO) 250 MG tablet   Other Relevant Orders   Culture, Urine       No follow-ups on file.    Farrel Conners, MD

## 2022-03-28 LAB — URINE CULTURE
MICRO NUMBER:: 14472957
Result:: NO GROWTH
SPECIMEN QUALITY:: ADEQUATE

## 2022-03-30 DIAGNOSIS — R197 Diarrhea, unspecified: Secondary | ICD-10-CM | POA: Diagnosis not present

## 2022-03-30 DIAGNOSIS — K900 Celiac disease: Secondary | ICD-10-CM | POA: Diagnosis not present

## 2022-04-01 ENCOUNTER — Other Ambulatory Visit (HOSPITAL_COMMUNITY): Payer: Self-pay

## 2022-04-02 ENCOUNTER — Ambulatory Visit (HOSPITAL_BASED_OUTPATIENT_CLINIC_OR_DEPARTMENT_OTHER)
Admission: RE | Admit: 2022-04-02 | Discharge: 2022-04-02 | Disposition: A | Discharge status: Home / Self Care | Payer: Commercial Managed Care - PPO | Source: Ambulatory Visit | Attending: Family Medicine | Admitting: Family Medicine

## 2022-04-02 DIAGNOSIS — R109 Unspecified abdominal pain: Secondary | ICD-10-CM

## 2022-04-02 DIAGNOSIS — R1011 Right upper quadrant pain: Secondary | ICD-10-CM

## 2022-04-02 DIAGNOSIS — R3129 Other microscopic hematuria: Secondary | ICD-10-CM | POA: Diagnosis not present

## 2022-04-02 MED ORDER — IOHEXOL 350 MG/ML SOLN
100.0000 mL | Freq: Once | INTRAVENOUS | Status: AC | PRN
  Administered 2022-04-02: 100 mL via INTRAVENOUS

## 2022-04-03 ENCOUNTER — Other Ambulatory Visit: Payer: Self-pay

## 2022-04-04 ENCOUNTER — Other Ambulatory Visit (HOSPITAL_COMMUNITY): Payer: Self-pay

## 2022-04-04 ENCOUNTER — Encounter: Payer: Self-pay | Admitting: Family Medicine

## 2022-04-04 ENCOUNTER — Other Ambulatory Visit: Payer: Self-pay | Admitting: Physical Medicine & Rehabilitation

## 2022-04-04 ENCOUNTER — Other Ambulatory Visit (HOSPITAL_BASED_OUTPATIENT_CLINIC_OR_DEPARTMENT_OTHER): Payer: Self-pay

## 2022-04-04 ENCOUNTER — Encounter (HOSPITAL_BASED_OUTPATIENT_CLINIC_OR_DEPARTMENT_OTHER): Payer: Self-pay | Admitting: Pharmacist

## 2022-04-04 ENCOUNTER — Other Ambulatory Visit: Payer: Self-pay

## 2022-04-04 DIAGNOSIS — M47816 Spondylosis without myelopathy or radiculopathy, lumbar region: Secondary | ICD-10-CM

## 2022-04-04 MED ORDER — CREON 24000-76000 UNITS PO CPEP
ORAL_CAPSULE | ORAL | 1 refills | Status: DC
  Filled 2022-04-04: qty 270, 90d supply, fill #0
  Filled 2022-04-17: qty 270, 90d supply, fill #1

## 2022-04-07 ENCOUNTER — Other Ambulatory Visit (HOSPITAL_BASED_OUTPATIENT_CLINIC_OR_DEPARTMENT_OTHER): Payer: Self-pay

## 2022-04-07 ENCOUNTER — Encounter (HOSPITAL_BASED_OUTPATIENT_CLINIC_OR_DEPARTMENT_OTHER): Payer: Self-pay | Admitting: Pharmacist

## 2022-04-07 MED ORDER — MORPHINE SULFATE ER 30 MG PO TBCR
30.0000 mg | EXTENDED_RELEASE_TABLET | Freq: Two times a day (BID) | ORAL | 0 refills | Status: DC
  Filled 2022-04-07: qty 60, 30d supply, fill #0

## 2022-04-08 ENCOUNTER — Other Ambulatory Visit (HOSPITAL_BASED_OUTPATIENT_CLINIC_OR_DEPARTMENT_OTHER): Payer: Self-pay

## 2022-04-09 ENCOUNTER — Encounter: Payer: Self-pay | Admitting: Physical Medicine & Rehabilitation

## 2022-04-09 ENCOUNTER — Other Ambulatory Visit (HOSPITAL_BASED_OUTPATIENT_CLINIC_OR_DEPARTMENT_OTHER): Payer: Self-pay

## 2022-04-09 ENCOUNTER — Ambulatory Visit (INDEPENDENT_AMBULATORY_CARE_PROVIDER_SITE_OTHER): Payer: Commercial Managed Care - PPO | Admitting: Family Medicine

## 2022-04-09 ENCOUNTER — Encounter
Payer: Commercial Managed Care - PPO | Attending: Physical Medicine & Rehabilitation | Admitting: Physical Medicine & Rehabilitation

## 2022-04-09 ENCOUNTER — Other Ambulatory Visit: Payer: Self-pay

## 2022-04-09 ENCOUNTER — Encounter: Payer: Self-pay | Admitting: Family Medicine

## 2022-04-09 ENCOUNTER — Other Ambulatory Visit (HOSPITAL_COMMUNITY): Payer: Self-pay

## 2022-04-09 VITALS — BP 117/75 | HR 106 | Ht 67.5 in | Wt 208.0 lb

## 2022-04-09 VITALS — BP 100/78 | HR 100 | Temp 98.9°F | Ht 67.5 in | Wt 206.4 lb

## 2022-04-09 DIAGNOSIS — Z79891 Long term (current) use of opiate analgesic: Secondary | ICD-10-CM | POA: Diagnosis not present

## 2022-04-09 DIAGNOSIS — M75101 Unspecified rotator cuff tear or rupture of right shoulder, not specified as traumatic: Secondary | ICD-10-CM | POA: Insufficient documentation

## 2022-04-09 DIAGNOSIS — D45 Polycythemia vera: Secondary | ICD-10-CM | POA: Diagnosis not present

## 2022-04-09 DIAGNOSIS — Z0000 Encounter for general adult medical examination without abnormal findings: Secondary | ICD-10-CM

## 2022-04-09 DIAGNOSIS — L4050 Arthropathic psoriasis, unspecified: Secondary | ICD-10-CM | POA: Insufficient documentation

## 2022-04-09 DIAGNOSIS — M47816 Spondylosis without myelopathy or radiculopathy, lumbar region: Secondary | ICD-10-CM | POA: Insufficient documentation

## 2022-04-09 DIAGNOSIS — K8689 Other specified diseases of pancreas: Secondary | ICD-10-CM | POA: Insufficient documentation

## 2022-04-09 DIAGNOSIS — F5104 Psychophysiologic insomnia: Secondary | ICD-10-CM

## 2022-04-09 DIAGNOSIS — G894 Chronic pain syndrome: Secondary | ICD-10-CM | POA: Diagnosis not present

## 2022-04-09 DIAGNOSIS — Z5181 Encounter for therapeutic drug level monitoring: Secondary | ICD-10-CM | POA: Diagnosis not present

## 2022-04-09 DIAGNOSIS — Z13220 Encounter for screening for lipoid disorders: Secondary | ICD-10-CM

## 2022-04-09 LAB — LIPID PANEL
Cholesterol: 113 mg/dL (ref 0–200)
HDL: 40.6 mg/dL (ref >39.00)
LDL Cholesterol: 60 mg/dL (ref 0–99)
NonHDL: 72.24
Total CHOL/HDL Ratio: 3
Triglycerides: 61 mg/dL (ref 0.0–149.0)
VLDL: 12.2 mg/dL (ref 0.0–40.0)

## 2022-04-09 LAB — CBC WITH DIFFERENTIAL/PLATELET
Basophils Absolute: 0 10*3/uL (ref 0.0–0.1)
Basophils Relative: 0.1 % (ref 0.0–3.0)
Eosinophils Absolute: 0 10*3/uL (ref 0.0–0.7)
Eosinophils Relative: 0 % (ref 0.0–5.0)
HCT: 47.2 % (ref 39.0–52.0)
Hemoglobin: 15.3 g/dL (ref 13.0–17.0)
Lymphocytes Relative: 20 % (ref 12.0–46.0)
Lymphs Abs: 0.8 10*3/uL (ref 0.7–4.0)
MCHC: 32.4 g/dL (ref 30.0–36.0)
MCV: 76 fl — ABNORMAL LOW (ref 78.0–100.0)
Monocytes Absolute: 0.4 10*3/uL (ref 0.1–1.0)
Monocytes Relative: 9.5 % (ref 3.0–12.0)
Neutro Abs: 2.9 10*3/uL (ref 1.4–7.7)
Neutrophils Relative %: 70.4 % (ref 43.0–77.0)
Platelets: 204 10*3/uL (ref 150.0–400.0)
RBC: 6.21 Mil/uL — ABNORMAL HIGH (ref 4.22–5.81)
RDW: 15.7 % — ABNORMAL HIGH (ref 11.5–15.5)
WBC: 4 10*3/uL (ref 4.0–10.5)

## 2022-04-09 MED ORDER — METHOTREXATE SODIUM 2.5 MG PO TABS
15.0000 mg | ORAL_TABLET | ORAL | 0 refills | Status: DC
  Filled 2022-04-09: qty 72, 84d supply, fill #0

## 2022-04-09 MED ORDER — MORPHINE SULFATE ER 30 MG PO TBCR
30.0000 mg | EXTENDED_RELEASE_TABLET | Freq: Two times a day (BID) | ORAL | 0 refills | Status: DC
  Filled 2022-04-09 – 2022-05-06 (×2): qty 60, 30d supply, fill #0
  Filled 2022-05-07: qty 40, 20d supply, fill #0
  Filled 2022-05-07: qty 20, 10d supply, fill #0

## 2022-04-09 MED ORDER — OXYCODONE HCL 10 MG PO TABS
10.0000 mg | ORAL_TABLET | Freq: Three times a day (TID) | ORAL | 0 refills | Status: DC | PRN
  Filled 2022-04-09: qty 60, 20d supply, fill #0

## 2022-04-09 MED ORDER — TRAZODONE HCL 50 MG PO TABS
25.0000 mg | ORAL_TABLET | Freq: Every evening | ORAL | 3 refills | Status: DC | PRN
  Filled 2022-04-09: qty 30, 60d supply, fill #0

## 2022-04-09 NOTE — Patient Instructions (Signed)
ALWAYS FEEL FREE TO CALL OUR OFFICE WITH ANY PROBLEMS OR QUESTIONS ([phone])  **PLEASE NOTE** ALL MEDICATION REFILL REQUESTS (INCLUDING CONTROLLED SUBSTANCES) NEED TO BE MADE AT LEAST 7 DAYS PRIOR TO REFILL BEING DUE. ANY REFILL REQUESTS INSIDE THAT TIME FRAME MAY RESULT IN DELAYS IN RECEIVING YOUR PRESCRIPTION.                    

## 2022-04-09 NOTE — Progress Notes (Signed)
Complete physical exam  Patient: Jesse Bowers   DOB: 17-Nov-1965   57 y.o. Adult  MRN: [id number]  Subjective:    Chief Complaint  Patient presents with  . Annual Exam    Jesse Bowers is a 57 y.o. adult who presents today for a complete physical exam. He reports consuming a gluten free, high protein diet. Exercise is limited by orthopedic condition(s): psoriatic arthritis. He generally feels fairly well. He reports sleeping poorly. He does not have additional problems to discuss today.   Patient reports a chronic history of insomnia. States that he is only sleeping around 3 hours at night. Wakes up every hour or so and has trouble falling back asleep. States he used to be on benzodiazepines but he did not like these. We discussed other options for treatment. Pt reports Dr. Katy Fitch is his eye doctor, sees a dentist regularly.  Most recent fall risk assessment:    04/09/2022    9:32 AM  Fall Risk   Falls in the past year? 1  Number falls in past yr: 0  Injury with Fall? 0  Risk for fall due to : History of fall(s)  Follow up Falls evaluation completed     Most recent depression screenings:    04/09/2022    9:32 AM 03/27/2022    8:30 AM  PHQ 2/9 Scores  PHQ - 2 Score 0 0  PHQ- 9 Score 6 4    Vision:Within last year and Dental: No current dental problems and Receives regular dental care  Patient Active Problem List   Diagnosis Date Noted  . Pancreatic insufficiency 04/09/2022  . Moderate single current episode of major depressive disorder ([place]) 03/27/2022  . Posterior vitreous detachment of right eye 11/18/2021  . Spondylosis of lumbar spine 10/03/2020  . Pseudophakia of both eyes 05/07/2020  . Retinal telangiectasis of both eyes 05/07/2020  . Lattice degeneration of peripheral retina, left 09/08/2019  . Lattice degeneration, right eye 09/08/2019  . Chronic diastolic heart failure ([place]) 05/24/2019  . Urinary dysfunction 04/12/2019  . Osteoarthritis of right  knee 08/23/2018  . Constipation due to opioid therapy 03/30/2018  . SNHL (sensorineural hearing loss) 12/04/2017  . Abnormal urinary stream 12/03/2017  . Osteoarthritis of carpometacarpal (CMC) joint of thumb 11/30/2017  . Muscle weakness 11/17/2017  . Transient vision disturbance 11/12/2017  . Bilateral hand pain 10/30/2017  . Pain of left hip joint 10/09/2017  . Gynecomastia 07/10/2017  . Iron deficiency anemia 07/05/2017  . Spasticity 05/20/2017  . Lumbar radiculitis 04/20/2017  . Ulnar 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, left 11/28/2016  . Idiopathic peripheral neuropathy 11/28/2016  . 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 Long term (current) use of anticoagulants 08/23/2015  . Right upper quadrant abdominal pain 08/23/2015  . Memory loss 05/10/2015  . Gait abnormality 04/07/2015  . Alkaline phosphatase elevation 04/07/2015  . History of thrombosis 03/26/2015  . Medial epicondylitis 02/07/2015  . Cognitive decline 12/21/2014  . Leukopenia 12/05/2014  . Rotator cuff syndrome of right shoulder 10/27/2014  . Status post left knee replacement 08/22/2014  . Left lateral epicondylitis 08/22/2014  . Chronic cerebral ischemia 08/18/2014  . Arthrofibrosis of knee joint 08/17/2014  . Cubital canal compression syndrome, left 08/17/2014  . 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 Chronic pain syndrome 04/10/2014  . Insomnia 04/10/2014  . Chronic non-specific white matter lesions on MRI 04/10/2014  . CFS (chronic fatigue syndrome) 04/10/2014  . Biceps tendonitis on left 03/01/2014  . Polycythemia vera ([place]) 12/27/2013  .  H/O TIA (transient ischemic attack) and stroke 12/27/2013  . Neck pain 12/27/2013  . OSA (obstructive sleep apnea) 12/08/2013  . 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 Depression with anxiety 08/04/2013  . Protein C deficiency ([place]) 08/01/2013  . Post traumatic stress disorder (PTSD) 08/01/2013  . Speech abnormality 07/25/2013  . Obesity 05/10/2013  . Lower  extremity edema 05/10/2013  . GERD (gastroesophageal reflux disease) 05/10/2013  . Arthritis 05/10/2013  . OA (osteoarthritis) of knee 03/15/2013  . Headache 10/25/2012  . Palpitations 10/18/2012  . Fatty liver determined by biopsy 06/01/2012  . Arthropathic psoriasis, unspecified ([place]) 04/29/2012  . Abnormal liver enzymes 03/29/2012  . Psoriatic arthritis ([place]) 12/29/2011  . Left Renal Hydronephrosis 12/11/2010  . 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 Celiac disease 05/27/2010  . Thyroid nodule 05/27/2010  . Male-to-male transgender person 09/20/2002      Patient Care Team: Farrel Conners, MD as PCP - General (Family Medicine) Sanda Klein, MD as PCP - Cardiology (Cardiology) Bernadene Bell, MD (Inactive) as Consulting Physician (Hematology) Sheryn Bison, MD as Referring Physician (Dermatology) Elease Hashimoto, Trixie Deis, MD as Referring Physician (Gastroenterology) [name], MD as Consulting Physician (Hematology) Rozetta Nunnery, MD (Inactive) as Consulting Physician (Otolaryngology) Doree Fudge, PhD as Consulting Physician (Psychology) Lendon Colonel, MD as Referring Physician (Psychiatry) Meredith Staggers, MD as Consulting Physician (Physical Medicine and Rehabilitation) Hermelinda Medicus, MD as Consulting Physician (Rheumatology) [name], MD as Consulting Physician (Orthopedic Surgery) Justice Britain, MD as Consulting Physician (Orthopedic Surgery) Hollice Espy, MD as Consulting Physician (Urology) Shawnee Knapp, MD (Family Medicine)   Outpatient Medications Prior to Visit  Medication Sig  . baclofen (LIORESAL) 20 MG tablet Take 1-2 tablets (20-40 mg total) by mouth 4 (four) times daily. 1 tablet with breakfast, lunch, and dinner and 2 tablets at bedtime  . cycloSPORINE (RESTASIS) 0.05 % ophthalmic emulsion Instill 1 drop into both eyes twice a day  . desonide (DESOWEN) 0.05 % cream 1(one) application(s) topical 2(two) times  a day  . desvenlafaxine (PRISTIQ) 100 MG 24 hr tablet Take 1 tablet by mouth once daily.  [name] desvenlafaxine (PRISTIQ) 50 MG 24 hr tablet Take 1 tablet by mouth once daily.  . fluocinonide-emollient (LIDEX-E) 0.05 % cream Apply 1 Application topically 2 (two) times daily.  . folic acid (KP FOLIC ACID) 1 MG tablet Take 1 tablet po every day  . furosemide (LASIX) 20 MG tablet Take 1 tablet (20 mg total) by mouth daily as needed for edema.  . furosemide (LASIX) 80 MG tablet Take 1 tablet (80 mg total) by mouth daily.  . hydrOXYzine (ATARAX) 25 MG tablet Take 1 tablet by mouth 3 times daily.  . methotrexate (RHEUMATREX) 2.5 MG tablet Take 6 tablets (15 mg total) by mouth once a week.  . morphine (MS CONTIN) 30 MG 12 hr tablet Take 1 tablet (30 mg total) by mouth every 12 (twelve) hours.  [name] OVER THE COUNTER MEDICATION Tums-'1000mg'$  once a day  . Oxycodone HCl 10 MG TABS Take 1 tablet (10 mg total) by mouth every 8 (eight) hours as needed (pain).  . Pancrelipase, Lip-Prot-Amyl, (CREON) 24000-76000 units CPEP Take 1 capsule (24,000 units of lipase total) by mouth 3 times daily with meals.  . potassium chloride (KLOR-CON) 10 MEQ tablet Take 2 tablets by mouth every day.  . rivaroxaban (XARELTO) 20 MG TABS tablet Take 1 tablet (20 mg total) by mouth nightly.  . Secukinumab (COSENTYX SENSOREADY PEN) 150 MG/ML SOAJ Inject 2 pens  subcutaneously every 4 weeks  . Tenapanor HCl (IBSRELA) 50 MG TABS Take 50 mg by mouth 2 (two) times daily.  . Testosterone 25 MG/2.5GM (1%) GEL Apply a pea-sized amount to affected area once daily.  [name] testosterone cypionate (DEPOTESTOSTERONE CYPIONATE) 200 MG/ML injection Inject 0.5 mLs (100 mg total) into the muscle every 7 days.  . ursodiol (ACTIGALL) 250 MG tablet Take 1 tablet by mouth at lunch and dinner in addition to ursodiol 500 mg for a total 750 mg dose.  . ursodiol (ACTIGALL) 500 MG tablet Take 1 tablet (500 mg) by mouth at lunch and dinner in addition to ursodiol 250 mg for  a total 750 mg dose.  . Vitamin D, Ergocalciferol, (DRISDOL) 1.25 MG (50000 UNIT) CAPS capsule Take 1 capsule (50,000 Units total) by mouth once a week.  . metoprolol tartrate (LOPRESSOR) 25 MG tablet Take 1 tablet (25 mg total) by mouth 2 (two) times daily as needed.   No facility-administered medications prior to visit.    Review of Systems  All other systems reviewed and are negative.         Objective:     BP 100/78 (BP Location: Left Arm, Patient Position: Sitting, Cuff Size: Large)   Pulse 100   Temp 98.9 F (37.2 C) (Oral)   Ht 5' 7.5" (1.715 m)   Wt 206 lb 6.4 oz (93.6 kg)   SpO2 98%   BMI 31.85 kg/m    Physical Exam Vitals reviewed.  Constitutional:      Appearance: Normal appearance. He is well-groomed and normal weight.  HENT:     Right Ear: Tympanic membrane normal.     Left Ear: Tympanic membrane normal.  Eyes:     Extraocular Movements: Extraocular movements intact.     Conjunctiva/sclera: Conjunctivae normal.  Neck:     Thyroid: No thyromegaly.  Cardiovascular:     Rate and Rhythm: Normal rate and regular rhythm.     Heart sounds: S1 normal and S2 normal. No murmur heard. Pulmonary:     Effort: Pulmonary effort is normal.     Breath sounds: Normal breath sounds and air entry. No rales.  Abdominal:     General: Abdomen is flat. Bowel sounds are normal.  Musculoskeletal:     Right lower leg: No edema.     Left lower leg: No edema.  Neurological:     General: No focal deficit present.     Mental Status: He is alert and oriented to person, place, and time.     Gait: Gait is intact.  Psychiatric:        Mood and Affect: Mood and affect normal.     No results found for any visits on 04/09/22.     Assessment & Plan:    Routine Health Maintenance and Physical Exam  Immunization History  Administered Date(s) Administered  . Influenza Split 11/29/2010, 12/15/2011, 12/10/2012, 11/16/2013  . Influenza,inj,Quad PF,6+ Mos 11/15/2014, 01/03/2016,  12/22/2017, 10/29/2018, 11/24/2018  . Influenza,inj,quad, With Preservative 12/01/2016  . Influenza-Unspecified 12/01/2013, 01/03/2016, 11/28/2016, 12/22/2017, 12/06/2019, 11/01/2021  . Moderna Sars-Covid-2 Vaccination 08/01/2020, 11/24/2020, 11/01/2021  . PFIZER(Purple Top)SARS-COV-2 Vaccination 11/04/2019  . PNEUMOCOCCAL CONJUGATE-20 03/04/2022  . Pension scheme manager 32yr & up 06/22/2019, 02/28/2021  . Pneumococcal Conjugate-13 12/01/2013  . Td 03/03/2006, 03/02/2014  . Tdap 03/02/2014    Health Maintenance  Topic Date Due  . COVID-19 Vaccine (7 - 2023-24 season) 04/25/2022 (Originally 12/27/2021)  . HIV Screening  03/05/2023 (Originally 09/24/1980)  . DTaP/Tdap/Td (4 -  Td or Tdap) 03/02/2024  . COLONOSCOPY (Pts 45-105yr Insurance coverage will need to be confirmed)  05/02/2030  . INFLUENZA VACCINE  Completed  . Hepatitis C Screening  Completed  . Zoster Vaccines- Shingrix  Completed  . HPV VACCINES  Aged Out  . MAMMOGRAM  Discontinued    Discussed health benefits of physical activity, and encouraged him to engage in regular exercise appropriate for his age and condition.  Problem List Items Addressed This Visit       Unprioritized   Polycythemia vera ([place] (Chronic)   Relevant Orders   CBC with Differential/Platelets   Pancreatic insufficiency (Chronic)   Other Visit Diagnoses     Lipid screening    -  Primary   Relevant Orders   Lipid Panel   Chronic insomnia       Relevant Medications   traZODone (DESYREL) 50 MG tablet   Routine general medical examination at a health care facility      Discussion of sleep quality has revealed chronic insomnia, we discussed treatment options- pt has an allergy to nortriptyline so wlil give him a trial of trazodone to see if this will help him stay asleep. Otherwise, physical exam findings are stable/ at baseline for the patient. RTC in 6 months.     Return in 6 months (on 10/08/2022).     BFarrel Conners  MD

## 2022-04-09 NOTE — Progress Notes (Signed)
Subjective:    Patient ID: Jesse Bowers, male   DOB: February 09, 1966, 57 y.o.   MRN: [id number]  HPI  Jesse Bowers is here in follow up of his chronic pain. He was found to have pancreatic insufficiency. He's now on creon for control. His cramping has improved already.  He had substantial left upper abdominal pain for a while with associated oily frequent stools.  He had used some of his oxycodone for the pain that was so severe.   From a pain standpoint he's fairly stable. He remains on his meds as prescribed including ms contin and oxycodone.   He has developed hammer toe in the right 2nd toe with perhaps a wart on the inner side.   He he does feel that his low back is tighter than it used to be. He does do some stretching at home.  The pain really is nonradiating and generally centrally located along his belt line.   Pain Inventory Average Pain 8 Pain Right Now 6 My pain is intermittent, sharp, burning, stabbing, tingling, and aching  LOCATION OF PAIN  wrist, fingers, back, groin, hip, knee, ankle, toes  BOWEL Number of stools per week: 28-35 Oral laxative use Yes  Type of laxative 7x day Enema or suppository use No  History of colostomy No  Incontinent No   BLADDER Normal In and out cath, frequency posterior positioned urethra Able to self cath  . Bladder incontinence No  Frequent urination Yes  Leakage with coughing No  Difficulty starting stream No  Incomplete bladder emptying No    Mobility use a cane use a walker how many minutes can you walk? 10 ability to climb steps?  yes do you drive?  yes use a wheelchair transfers alone  Function disabled: date disabled 10/14/2012 I need assistance with the following:  meal prep, household duties, and shopping  Neuro/Psych weakness numbness tremor tingling trouble walking spasms dizziness anxiety  Prior Studies Any changes since last visit?  no  Physicians involved in your care Any changes since last  visit?  no   Family History  Problem Relation Age of Onset   Stroke Maternal Grandfather        67   Heart attack Maternal Grandfather    Glaucoma Maternal Grandfather    Macular degeneration Maternal Grandfather    Breast cancer Sister    Hypertension Mother    Psoriasis Mother    Other Mother        meningioma developed ~2019   Glaucoma Mother    Cancer Paternal Grandfather    Heart attack Paternal Grandfather    Stroke Paternal Uncle        age 20   Polycythemia Paternal Uncle    Stroke Maternal Grandmother    Congestive Heart Failure Maternal Grandmother    Heart attack Maternal Grandmother    Protein C deficiency Sister 39       Miscarriages   Breast cancer Maternal Aunt 86   Social History   Socioeconomic History   Marital status: Married    Spouse name: Not on file   Number of children: 2   Years of education: 4y college   Highest education level: Not on file  Occupational History   Occupation: Pediatric Nurse practitioner    Comment: Not working since CVA 2015  Tobacco Use   Smoking status: Never   Smokeless tobacco: Never  Vaping Use   Vaping Use: Never used  Substance and Sexual Activity   Alcohol use: Yes  Comment: social   Drug use: No   Sexual activity: Yes    Birth control/protection: None    Comment: patient is a transgender on testosterone shots, no biological kids  Other Topics Concern   Not on file  Social History Narrative   Not on file   Social Determinants of Health   Financial Resource Strain: Not on file  Food Insecurity: Not on file  Transportation Needs: Not on file  Physical Activity: Not on file  Stress: Not on file  Social Connections: Not on file   Past Surgical History:  Procedure Laterality Date   ABDOMINAL HYSTERECTOMY Bilateral 1994   TAH, BSO- tranverse incision at 57 yo   ANKLE ARTHROSCOPY WITH RECONSTRUCTION Right 2007   CHOLECYSTECTOMY     laparoscopic   COLONOSCOPY     x3   EYE SURGERY     Left eye  03/02/2018, right 02/15/2018   HEMORROIDECTOMY  08/19/2020   HIP ARTHROSCOPY W/ LABRAL REPAIR Right 05/11/2013   acetabular labral tear 03/30/2013   KNEE ARTHROPLASTY Right    KNEE JOINT MANIPULATION Left    x3 under anesthesia   KNEE SURGERY Bilateral 1984   Right ACL, left PCL repair   LITHOTRIPSY  2005   LIVER BIOPSY  2013   normal results.   MASTECTOMY Bilateral    prior to 2009   MOUTH SURGERY     NASAL SEPTUM SURGERY N/A 09/20/2015   by ENT Dr. [name]   OVARIAN CYST SURGERY Left    size of grapefruit, was informed that she had shortened vagina   SHOULDER SURGERY Bilateral    Right 08/15/2016, Left 11/15/2016   THUMB ARTHROSCOPY Left    THYROIDECTOMY, PARTIAL Left 2008   TOTAL KNEE ARTHROPLASTY Right 08/23/2018   Procedure: TOTAL KNEE ARTHROPLASTY;  Surgeon: [name], MD;  Location: [facility];  Service: Orthopedics;  Laterality: Right;  10mn   TOTAL KNEE REVISION Left 02/06/2016   Procedure: LEFT TOTAL KNEE REVISION;  Surgeon: FGaynelle Arabian MD;  Location: [facility];  Service: Orthopedics;  Laterality: Left;   TOTAL KNEE REVISION Left 04/22/2017   Procedure: Left knee polyethylene revision;  Surgeon: AGaynelle Arabian MD;  Location: [facility];  Service: Orthopedics;  Laterality: Left;   UPPER GI ENDOSCOPY  2003   Past Medical History:  Diagnosis Date   Abnormal weight loss    Anxiety    Arthritis    Cataract    OU   Celiac disease    Cervical neck pain with evidence of disc disease    patient has a cyst    Chronic constipation    Chronic diastolic heart failure (HCC)    Pt. denies   Chronic pain    Degenerative disc disease at L5-S1 level    with stenosis   DVT (deep venous thrombosis) (HCC)    Right upper arm, bilateral leg   Eczema    inguinal, feet   Elevated liver enzymes    [place] ([place] 04/22/2017   Family history of adverse reaction to anesthesia    family has problems with anesthesia of nausea and vomiting    GERD (gastroesophageal  reflux disease)    History of in 20's   Gluten enteropathy    H/O parotitis    right    Hard of hearing    History of kidney stones    History of retinal tear    Bilateral   History of staph infection    required wound vac   Hx-TIA (  transient ischemic attack)    2015   Kidney stones 08/2020   LVH (left ventricular hypertrophy) 12/15/2016   Mild, noted on ECHO   MVP (mitral valve prolapse)    NAFL (nonalcoholic fatty liver)    Neck pain    Neuromuscular disorder (HCC)    bilateral neuropathy feet.   Nuclear sclerotic cataract of both eyes 09/08/2019   Pneumonia 12/17/2010   Polycythemia    Polycythemia, secondary    PONV (postoperative nausea and vomiting)    Protein C deficiency (HCC)    Dr. Anne Fu   Psoriasis    16 X10 cm psoriatic rash on sole of left foot ; open and occ scant bleeding;    psoriatic arthritis    PTSD (post-traumatic stress disorder)    Scaphoid fracture of wrist 09/23/2013   Seizure ([place])    childhood, medication until age 58 then weanned completely off   Sleep apnea    split night study last done by Dr. [name] 06/18/15 shows severe OSA, CSA, and hypersomnia, rec bipap   Splenomegaly    Stenosis of ureteropelvic junction (UPJ)    left   Stroke [facility])    CVA vs TIA in left cerebrum causing slight right sided weakness-Dr. [name] follows   Syrinx of spinal cord ([place]) 01/06/2014   c spine on MRI   Tachycardia    hx of    Transfusion history    past history- none recent, after surgeries due to blood loss   Transgender with history of gender affirmation surgery    Wears glasses    Wears hearing aid    BP 117/75   Pulse (!) 106   Ht 5' 7.5" (1.715 m)   Wt 208 lb (94.3 kg)   SpO2 96%   BMI 32.10 kg/m   Opioid Risk Score:   Fall Risk Score:  `1  Depression screen [employer] 2/9     04/09/2022    9:32 AM 03/27/2022    8:30 AM 02/05/2022    1:07 PM 10/02/2021    1:51 PM 06/04/2021    1:13 PM 04/03/2021    1:01 PM 01/09/2021    1:09 PM  Depression  screen PHQ 2/9  Decreased Interest 0 0 0 0 0 1 0  Down, Depressed, Hopeless 0 0 0 0 0 1 0  PHQ - 2 Score 0 0 0 0 0 2 0  Altered sleeping 3 2       Tired, decreased energy 3 1       Change in appetite 0 0       Feeling bad or failure about yourself  0 0       Trouble concentrating 0 1       Moving slowly or fidgety/restless 0 0       Suicidal thoughts 0 0       PHQ-9 Score 6 4       Difficult doing work/chores Somewhat difficult Somewhat difficult          Review of Systems  Musculoskeletal:  Positive for back pain.       Wrist, fingers, groin, hip, knee, ankle, toe pain spasms  Neurological:  Positive for dizziness, tremors, weakness and numbness.  Psychiatric/Behavioral:  The patient is nervous/anxious.   All other systems reviewed and are negative.     Objective:   Physical Exam  General: No acute distress HEENT: NCAT, EOMI, oral membranes moist Cards: reg rate  Chest: normal effort Abdomen: Soft, NT, ND Skin: dry, intact  Extremities: no edema Psych: pleasant and appropriate  Skin: intact Neuro: Alert and oriented x 3. Normal insight and awareness. Intact Memory. Normal language and speech. Cranial nerve exam unremarkable. LE motor 5/5 LLE and 4-5/ RLe with more weakness in ADF than anywhere else. He has stocking glove sensory loss in both feet below ankles as well as sensory loss along left lateral leg. Right 2-4 toes more affected. Tends to land farther ahead with left foot than right.  valgus at left knee, steppage pattern with right knee.    no abnormal resting tone Musculoskeletal:  full knee rom.  Mild hammertoe deformity along the second right toe.  Low back with flattening of lordotic curve. Tighter with rotation to the right han left. Pain along beltline central and to the right. Flexion and extension sl limited as well.            Assessment & Plan:  1. Psoriatic arthritis with pain in multiple areas, most prominently feet, hands, elbows. Pain also related to  his prior CVA and associated motor/sensory change   2. 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 3. Hx of left total knee revision again on 04/21/17 per [facility] and right TKA 08/2018 4. Chronic  low back pain---MRI with severe DDD at L5-S1. Left S1 radiculopathy? only mild foraminal stenosis however -s/p ESI 12/21/20  -decreased ROM in lumbar spine 5. Protein C deficiency   6. Left shoulder subluxation, bicipital tendonitis 7. Central sleep apnea 8. Polycythemia 9. Depression with anxiety.  0. Left lateral epidondylitis  11. 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  now having right and left sided weakness with increased sensory loss in C4-6 dermatomes.. Most recent cervical MRI (07/08/17) without significant change 12. 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 [place] 14. Right wrist psoriatic arthritis, tendonapthy, ganglion cysts s/p multiple recent surgeries.  s/p right thumb replacement 15. Recent gender reconstruction surgery     Plan:  1. Continue venlafaxine per pyschiatry 2. MTX, etc per rheum 3. Voltaren gel to hands, feet, knees, elbows--- continue 4. Baclofen for lower extermity spasms/back pain. increase to '20mg'$  5x daily, taking '20mg'$  tid and '40mg'$  at bedtime 5. continue Ms Contin to [id number] #[id number] --refilled this for next month as I refilled the February dose a few days ago             -  move his oxycodone back to #60--a second refill this was provided today  We will continue the controlled substance monitoring program, this consists of regular clinic visits, examinations, routine drug screening, pill [place] well [place]  .   6. MRI of cervical spine to assess C4-6 reviewed and without acute changes including area of syrinx         -  home traction  unit -HEP  7. Referral to PT for lumbar rom, posture, strengthening and HEp.  -consider f/u ESI at some point -mechanics of gait important as well. 8: OI constipation  -linzess  9. ?Qutenza candidate for ?peripheral neuropathy.     30 minutes of teletime was spent during this visit. All questions were encouraged and answered.  Follow up with NP in 2 mos .

## 2022-04-09 NOTE — Patient Instructions (Signed)
Health Maintenance, Male Adopting a healthy lifestyle and getting preventive care are important in promoting health and wellness. Ask your health care provider about: The right schedule for you to have regular tests and exams. Things you can do on your own to prevent diseases and keep yourself healthy. What should I know about diet, weight, and exercise? Eat a healthy diet  Eat a diet that includes plenty of vegetables, fruits, low-fat dairy products, and lean protein. Do not eat a lot of foods that are high in solid fats, added sugars, or sodium. Maintain a healthy weight Body mass index (BMI) is a measurement that can be used to identify possible weight problems. It estimates body fat based on height and weight. Your health care provider can help determine your BMI and help you achieve or maintain a healthy weight. Get regular exercise Get regular exercise. This is one of the most important things you can do for your health. Most adults should: Exercise for at least 150 minutes each week. The exercise should increase your heart rate and make you sweat (moderate-intensity exercise). Do strengthening exercises at least twice a week. This is in addition to the moderate-intensity exercise. Spend less time sitting. Even light physical activity can be beneficial. Watch cholesterol and blood lipids Have your blood tested for lipids and cholesterol at 57 years of age, then have this test every 5 years. You may need to have your cholesterol levels checked more often if: Your lipid or cholesterol levels are high. You are older than 57 years of age. You are at high risk for heart disease. What should I know about cancer screening? Many types of cancers can be detected early and may often be prevented. Depending on your health history and family history, you may need to have cancer screening at various ages. This may include screening for: Colorectal cancer. Prostate cancer. Skin cancer. Lung  cancer. What should I know about heart disease, diabetes, and high blood pressure? Blood pressure and heart disease High blood pressure causes heart disease and increases the risk of stroke. This is more likely to develop in people who have high blood pressure readings or are overweight. Talk with your health care provider about your target blood pressure readings. Have your blood pressure checked: Every 3-5 years if you are 18-39 years of age. Every year if you are 40 years old or older. If you are between the ages of 65 and 75 and are a current or former smoker, ask your health care provider if you should have a one-time screening for abdominal aortic aneurysm (AAA). Diabetes Have regular diabetes screenings. This checks your fasting blood sugar level. Have the screening done: Once every three years after age 45 if you are at a normal weight and have a low risk for diabetes. More often and at a younger age if you are overweight or have a high risk for diabetes. What should I know about preventing infection? Hepatitis B If you have a higher risk for hepatitis B, you should be screened for this virus. Talk with your health care provider to find out if you are at risk for hepatitis B infection. Hepatitis C Blood testing is recommended for: Everyone born from 1945 through 1965. Anyone with known risk factors for hepatitis C. Sexually transmitted infections (STIs) You should be screened each year for STIs, including gonorrhea and chlamydia, if: You are sexually active and are younger than 57 years of age. You are older than 57 years of age and your   health care provider tells you that you are at risk for this type of infection. Your sexual activity has changed since you were last screened, and you are at increased risk for chlamydia or gonorrhea. Ask your health care provider if you are at risk. Ask your health care provider about whether you are at high risk for HIV. Your health care provider  may recommend a prescription medicine to help prevent HIV infection. If you choose to take medicine to prevent HIV, you should first get tested for HIV. You should then be tested every 3 months for as long as you are taking the medicine. Follow these instructions at home: Alcohol use Do not drink alcohol if your health care provider tells you not to drink. If you drink alcohol: Limit how much you have to 0-2 drinks a day. Know how much alcohol is in your drink. In the [place] one drink equals one 12 oz bottle of beer (355 mL), one 5 oz glass of wine (148 mL), or one 1 oz glass of hard liquor (44 mL). Lifestyle Do not use any products that contain nicotine or tobacco. These products include cigarettes, chewing tobacco, and vaping devices, such as e-cigarettes. If you need help quitting, ask your health care provider. Do not use street drugs. Do not share needles. Ask your health care provider for help if you need support or information about quitting drugs. General instructions Schedule regular health, dental, and eye exams. Stay current with your vaccines. Tell your health care provider if: You often feel depressed. You have ever been abused or do not feel safe at home. Summary Adopting a healthy lifestyle and getting preventive care are important in promoting health and wellness. Follow your health care provider's instructions about healthy diet, exercising, and getting tested or screened for diseases. Follow your health care provider's instructions on monitoring your cholesterol and blood pressure. This information is not intended to replace advice given to you by your health care provider. Make sure you discuss any questions you have with your health care provider. Document Revised: 07/09/2020 Document Reviewed: 07/09/2020 Elsevier Patient Education  2023 Elsevier Inc.  

## 2022-04-10 DIAGNOSIS — F411 Generalized anxiety disorder: Secondary | ICD-10-CM | POA: Diagnosis not present

## 2022-04-10 DIAGNOSIS — F331 Major depressive disorder, recurrent, moderate: Secondary | ICD-10-CM | POA: Diagnosis not present

## 2022-04-11 ENCOUNTER — Other Ambulatory Visit (HOSPITAL_BASED_OUTPATIENT_CLINIC_OR_DEPARTMENT_OTHER): Payer: Self-pay

## 2022-04-11 MED ORDER — VITAMIN D (ERGOCALCIFEROL) 1.25 MG (50000 UNIT) PO CAPS
50000.0000 [IU] | ORAL_CAPSULE | ORAL | 0 refills | Status: DC
  Filled 2022-04-11: qty 12, 84d supply, fill #0

## 2022-04-12 LAB — DRUG TOX MONITOR 1 W/CONF, ORAL FLD

## 2022-04-12 LAB — DRUG TOX ALC METAB W/CON, ORAL FLD: Alcohol Metabolite: NEGATIVE ng/mL (ref <25)

## 2022-04-16 DIAGNOSIS — F331 Major depressive disorder, recurrent, moderate: Secondary | ICD-10-CM | POA: Diagnosis not present

## 2022-04-16 DIAGNOSIS — F411 Generalized anxiety disorder: Secondary | ICD-10-CM | POA: Diagnosis not present

## 2022-04-17 ENCOUNTER — Other Ambulatory Visit (HOSPITAL_BASED_OUTPATIENT_CLINIC_OR_DEPARTMENT_OTHER): Payer: Self-pay

## 2022-04-22 ENCOUNTER — Other Ambulatory Visit (HOSPITAL_BASED_OUTPATIENT_CLINIC_OR_DEPARTMENT_OTHER): Payer: Self-pay

## 2022-04-22 DIAGNOSIS — H903 Sensorineural hearing loss, bilateral: Secondary | ICD-10-CM | POA: Diagnosis not present

## 2022-04-22 DIAGNOSIS — L851 Acquired keratosis [keratoderma] palmaris et plantaris: Secondary | ICD-10-CM | POA: Diagnosis not present

## 2022-04-22 DIAGNOSIS — H608X3 Other otitis externa, bilateral: Secondary | ICD-10-CM | POA: Diagnosis not present

## 2022-04-22 DIAGNOSIS — L299 Pruritus, unspecified: Secondary | ICD-10-CM | POA: Diagnosis not present

## 2022-04-22 DIAGNOSIS — D485 Neoplasm of uncertain behavior of skin: Secondary | ICD-10-CM | POA: Diagnosis not present

## 2022-04-22 DIAGNOSIS — D3611 Benign neoplasm of peripheral nerves and autonomic nervous system of face, head, and neck: Secondary | ICD-10-CM | POA: Diagnosis not present

## 2022-04-22 MED ORDER — CLOTRIMAZOLE-BETAMETHASONE 1-0.05 % EX CREA
TOPICAL_CREAM | CUTANEOUS | 1 refills | Status: DC
  Filled 2022-04-22: qty 15, 14d supply, fill #0
  Filled 2022-05-21: qty 15, 14d supply, fill #1

## 2022-04-23 ENCOUNTER — Other Ambulatory Visit: Payer: Self-pay

## 2022-04-23 ENCOUNTER — Other Ambulatory Visit (HOSPITAL_BASED_OUTPATIENT_CLINIC_OR_DEPARTMENT_OTHER): Payer: Self-pay

## 2022-04-24 ENCOUNTER — Other Ambulatory Visit (HOSPITAL_BASED_OUTPATIENT_CLINIC_OR_DEPARTMENT_OTHER): Payer: Self-pay

## 2022-04-24 ENCOUNTER — Other Ambulatory Visit: Payer: Self-pay

## 2022-04-24 DIAGNOSIS — F331 Major depressive disorder, recurrent, moderate: Secondary | ICD-10-CM | POA: Diagnosis not present

## 2022-04-24 DIAGNOSIS — F411 Generalized anxiety disorder: Secondary | ICD-10-CM | POA: Diagnosis not present

## 2022-04-24 MED ORDER — TESTOSTERONE 25 MG/2.5GM (1%) TD GEL
1.0000 | Freq: Every day | TRANSDERMAL | 2 refills | Status: DC
  Filled 2022-04-24: qty 75, 30d supply, fill #0
  Filled 2022-06-06: qty 75, 30d supply, fill #1

## 2022-04-25 ENCOUNTER — Other Ambulatory Visit (HOSPITAL_BASED_OUTPATIENT_CLINIC_OR_DEPARTMENT_OTHER): Payer: Self-pay

## 2022-04-28 ENCOUNTER — Encounter (HOSPITAL_BASED_OUTPATIENT_CLINIC_OR_DEPARTMENT_OTHER): Payer: Self-pay

## 2022-04-28 ENCOUNTER — Other Ambulatory Visit (HOSPITAL_BASED_OUTPATIENT_CLINIC_OR_DEPARTMENT_OTHER): Payer: Self-pay

## 2022-04-28 DIAGNOSIS — L409 Psoriasis, unspecified: Secondary | ICD-10-CM | POA: Diagnosis not present

## 2022-04-28 DIAGNOSIS — L4050 Arthropathic psoriasis, unspecified: Secondary | ICD-10-CM | POA: Diagnosis not present

## 2022-04-28 DIAGNOSIS — Z79899 Other long term (current) drug therapy: Secondary | ICD-10-CM | POA: Diagnosis not present

## 2022-04-28 DIAGNOSIS — M2550 Pain in unspecified joint: Secondary | ICD-10-CM | POA: Diagnosis not present

## 2022-04-30 ENCOUNTER — Other Ambulatory Visit (HOSPITAL_BASED_OUTPATIENT_CLINIC_OR_DEPARTMENT_OTHER): Payer: Self-pay

## 2022-04-30 DIAGNOSIS — F411 Generalized anxiety disorder: Secondary | ICD-10-CM | POA: Diagnosis not present

## 2022-04-30 DIAGNOSIS — F331 Major depressive disorder, recurrent, moderate: Secondary | ICD-10-CM | POA: Diagnosis not present

## 2022-04-30 MED ORDER — DESONIDE 0.05 % EX CREA
TOPICAL_CREAM | CUTANEOUS | 2 refills | Status: DC
  Filled 2022-04-30: qty 60, 30d supply, fill #0
  Filled 2022-06-18: qty 60, 30d supply, fill #1
  Filled 2022-07-14: qty 60, 30d supply, fill #2

## 2022-05-01 ENCOUNTER — Other Ambulatory Visit (HOSPITAL_COMMUNITY): Payer: Self-pay

## 2022-05-02 ENCOUNTER — Other Ambulatory Visit (HOSPITAL_BASED_OUTPATIENT_CLINIC_OR_DEPARTMENT_OTHER): Payer: Self-pay

## 2022-05-06 ENCOUNTER — Other Ambulatory Visit (HOSPITAL_BASED_OUTPATIENT_CLINIC_OR_DEPARTMENT_OTHER): Payer: Self-pay

## 2022-05-06 ENCOUNTER — Other Ambulatory Visit: Payer: Self-pay

## 2022-05-07 ENCOUNTER — Other Ambulatory Visit (HOSPITAL_BASED_OUTPATIENT_CLINIC_OR_DEPARTMENT_OTHER): Payer: Self-pay

## 2022-05-07 ENCOUNTER — Other Ambulatory Visit: Payer: Self-pay

## 2022-05-07 DIAGNOSIS — F411 Generalized anxiety disorder: Secondary | ICD-10-CM | POA: Diagnosis not present

## 2022-05-07 DIAGNOSIS — F331 Major depressive disorder, recurrent, moderate: Secondary | ICD-10-CM | POA: Diagnosis not present

## 2022-05-09 ENCOUNTER — Other Ambulatory Visit (HOSPITAL_COMMUNITY): Payer: Self-pay

## 2022-05-12 ENCOUNTER — Other Ambulatory Visit (HOSPITAL_BASED_OUTPATIENT_CLINIC_OR_DEPARTMENT_OTHER): Payer: Self-pay

## 2022-05-14 ENCOUNTER — Other Ambulatory Visit (HOSPITAL_BASED_OUTPATIENT_CLINIC_OR_DEPARTMENT_OTHER): Payer: Self-pay

## 2022-05-14 DIAGNOSIS — R591 Generalized enlarged lymph nodes: Secondary | ICD-10-CM | POA: Diagnosis not present

## 2022-05-14 DIAGNOSIS — D751 Secondary polycythemia: Secondary | ICD-10-CM | POA: Diagnosis not present

## 2022-05-14 MED ORDER — XARELTO 20 MG PO TABS
20.0000 mg | ORAL_TABLET | Freq: Every evening | ORAL | 1 refills | Status: DC
  Filled 2022-05-14 – 2022-06-25 (×4): qty 90, 90d supply, fill #0
  Filled 2022-09-23: qty 90, 90d supply, fill #1

## 2022-05-15 ENCOUNTER — Other Ambulatory Visit: Payer: Self-pay

## 2022-05-15 ENCOUNTER — Other Ambulatory Visit (HOSPITAL_BASED_OUTPATIENT_CLINIC_OR_DEPARTMENT_OTHER): Payer: Self-pay

## 2022-05-15 ENCOUNTER — Ambulatory Visit (HOSPITAL_BASED_OUTPATIENT_CLINIC_OR_DEPARTMENT_OTHER): Payer: Commercial Managed Care - PPO | Attending: Physical Medicine & Rehabilitation | Admitting: Physical Therapy

## 2022-05-15 DIAGNOSIS — R262 Difficulty in walking, not elsewhere classified: Secondary | ICD-10-CM | POA: Diagnosis not present

## 2022-05-15 DIAGNOSIS — M6281 Muscle weakness (generalized): Secondary | ICD-10-CM | POA: Insufficient documentation

## 2022-05-15 DIAGNOSIS — F5101 Primary insomnia: Secondary | ICD-10-CM | POA: Diagnosis not present

## 2022-05-15 DIAGNOSIS — F32A Depression, unspecified: Secondary | ICD-10-CM | POA: Diagnosis not present

## 2022-05-15 DIAGNOSIS — F411 Generalized anxiety disorder: Secondary | ICD-10-CM | POA: Diagnosis not present

## 2022-05-15 DIAGNOSIS — M5459 Other low back pain: Secondary | ICD-10-CM | POA: Diagnosis not present

## 2022-05-15 DIAGNOSIS — F331 Major depressive disorder, recurrent, moderate: Secondary | ICD-10-CM | POA: Diagnosis not present

## 2022-05-15 DIAGNOSIS — F4310 Post-traumatic stress disorder, unspecified: Secondary | ICD-10-CM | POA: Diagnosis not present

## 2022-05-15 MED ORDER — DESVENLAFAXINE SUCCINATE ER 50 MG PO TB24
50.0000 mg | ORAL_TABLET | Freq: Every day | ORAL | 1 refills | Status: DC
  Filled 2022-08-11: qty 90, 90d supply, fill #0
  Filled 2022-09-17: qty 90, 90d supply, fill #1

## 2022-05-15 MED ORDER — DESVENLAFAXINE SUCCINATE ER 100 MG PO TB24
100.0000 mg | ORAL_TABLET | Freq: Every day | ORAL | 1 refills | Status: DC
  Filled 2022-05-15 – 2022-09-17 (×2): qty 90, 90d supply, fill #0

## 2022-05-15 NOTE — Therapy (Signed)
OUTPATIENT PHYSICAL THERAPY THORACOLUMBAR EVALUATION   Patient Name: Jesse Bowers MRN: [id number] DOB:1965/08/23, 57 y.o., adult Today's Date: 05/16/2022  END OF SESSION:  PT End of Session - 05/15/22 1724     Visit Number 1    Number of Visits 12    Date for PT Re-Evaluation 06/26/22    Authorization Type [employer]    PT Start Time 1625    PT Stop Time [id number]    PT Time Calculation (min) 50 min    Activity Tolerance Patient tolerated treatment well   Pt had pain with testing   Behavior During Therapy [facility] for tasks assessed/performed             Past Medical History:  Diagnosis Date   Abnormal weight loss    Anxiety    Arthritis    Cataract    OU   Celiac disease    Cervical neck pain with evidence of disc disease    patient has a cyst    Chronic constipation    Chronic diastolic heart failure ([place])    Pt. 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   Family history of adverse reaction to anesthesia    family has problems with anesthesia of nausea and vomiting    GERD (gastroesophageal reflux disease)    History of in 20's   Gluten enteropathy    H/O parotitis    right    Hard of hearing    History of kidney stones    History of retinal tear    Bilateral   History of staph infection    required wound vac   Hx-TIA (transient ischemic attack)    2015   Kidney stones 08/2020   LVH (left ventricular hypertrophy) 12/15/2016   Mild, noted on ECHO   MVP (mitral valve prolapse)    NAFL (nonalcoholic fatty liver)    Neck pain    Neuromuscular disorder ([place])    bilateral neuropathy feet.   Nuclear sclerotic cataract of both eyes 09/08/2019   Pneumonia 12/17/2010   Polycythemia    Polycythemia, secondary    PONV (postoperative  nausea and vomiting)    Protein C deficiency (HCC)    Dr. Anne Fu   Psoriasis    16 X10 cm psoriatic rash on sole of left foot ; open and occ scant bleeding;    psoriatic arthritis    PTSD (post-traumatic stress disorder)    Scaphoid fracture of wrist 09/23/2013   Seizure ([place])    childhood, medication until age 9 then weanned completely off   Sleep apnea    split night study last done by Dr. [name] 06/18/15 shows severe OSA, CSA, and hypersomnia, rec bipap   Splenomegaly    Stenosis of ureteropelvic junction (UPJ)    left   Stroke [facility])    CVA vs TIA in left cerebrum causing slight right sided weakness-Dr. [name] follows   Syrinx of spinal cord ([place]) 01/06/2014   c spine on MRI   Tachycardia    hx of    Transfusion history    past history- none recent, after surgeries due to blood loss   Transgender with history of gender affirmation surgery    Wears  glasses    Wears hearing aid    Past Surgical History:  Procedure Laterality Date   ABDOMINAL HYSTERECTOMY Bilateral 1994   TAH, BSO- tranverse incision at 57 yo   ANKLE ARTHROSCOPY WITH RECONSTRUCTION Right 2007   CHOLECYSTECTOMY     laparoscopic   COLONOSCOPY     x3   EYE SURGERY     Left eye 03/02/2018, right 02/15/2018   HEMORROIDECTOMY  08/19/2020   HIP ARTHROSCOPY W/ LABRAL REPAIR Right 05/11/2013   acetabular labral tear 03/30/2013   KNEE ARTHROPLASTY Right    KNEE JOINT MANIPULATION Left    x3 under anesthesia   KNEE SURGERY Bilateral 1984   Right ACL, left PCL repair   LITHOTRIPSY  2005   LIVER BIOPSY  2013   normal results.   MASTECTOMY Bilateral    prior to 2009   MOUTH SURGERY     NASAL SEPTUM SURGERY N/A 09/20/2015   by ENT Dr. [name]   OVARIAN CYST SURGERY Left    size of grapefruit, was informed that she had shortened vagina   SHOULDER SURGERY Bilateral    Right 08/15/2016, Left 11/15/2016   THUMB ARTHROSCOPY Left    THYROIDECTOMY, PARTIAL Left 2008   TOTAL KNEE ARTHROPLASTY Right 08/23/2018    Procedure: TOTAL KNEE ARTHROPLASTY;  Surgeon: [name], MD;  Location: [facility];  Service: Orthopedics;  Laterality: Right;  52min   TOTAL KNEE REVISION Left 02/06/2016   Procedure: LEFT TOTAL KNEE REVISION;  Surgeon: [name], MD;  Location: [facility];  Service: Orthopedics;  Laterality: Left;   TOTAL KNEE REVISION Left 04/22/2017   Procedure: Left knee polyethylene revision;  Surgeon: [name], MD;  Location: [facility];  Service: Orthopedics;  Laterality: Left;   UPPER GI ENDOSCOPY  2003   Patient Active Problem List   Diagnosis Date Noted   Pancreatic insufficiency 04/09/2022   Moderate single current episode of major depressive disorder ([place]) 03/27/2022   Posterior vitreous detachment of right eye 11/18/2021   Spondylosis of lumbar spine 10/03/2020   Pseudophakia of both eyes 05/07/2020   Retinal telangiectasis of both eyes 05/07/2020   Lattice degeneration of peripheral retina, left 09/08/2019   Lattice degeneration, right eye 09/08/2019   Chronic diastolic heart failure ([place]) 05/24/2019   Urinary dysfunction 04/12/2019   Osteoarthritis of right knee 08/23/2018   Constipation due to opioid therapy 03/30/2018   SNHL (sensorineural hearing loss) 12/04/2017   Abnormal urinary stream 12/03/2017   Osteoarthritis of carpometacarpal (CMC) joint of thumb 11/30/2017   Muscle weakness 11/17/2017   Transient vision disturbance 11/12/2017   Bilateral hand pain 10/30/2017   Pain of left hip joint 10/09/2017   Gynecomastia 07/10/2017   Iron deficiency anemia 07/05/2017   Spasticity 05/20/2017   Lumbar radiculitis 04/20/2017   Ulnar 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, left 11/28/2016   Idiopathic peripheral neuropathy 11/28/2016   Failed total knee arthroplasty, 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   Long term (current) use of anticoagulants 08/23/2015   Right upper quadrant abdominal pain 08/23/2015   Memory loss 05/10/2015   Gait abnormality 04/07/2015   Alkaline phosphatase elevation 04/07/2015   History of  thrombosis 03/26/2015   Medial epicondylitis 02/07/2015   Cognitive decline 12/21/2014   Leukopenia 12/05/2014   Rotator cuff syndrome of right shoulder 10/27/2014   Status post left knee replacement 08/22/2014   Left lateral epicondylitis 08/22/2014   Chronic cerebral ischemia 08/18/2014   Arthrofibrosis of knee joint 08/17/2014   Cubital canal compression syndrome, left 08/17/2014   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[place]) 04/10/2014  Chronic pain syndrome 04/10/2014   Insomnia 04/10/2014   Chronic non-specific white matter lesions on MRI 04/10/2014   CFS (chronic fatigue syndrome) 04/10/2014   Biceps tendonitis on left 03/01/2014   Polycythemia vera ([place]) 12/27/2013   H/O TIA (transient ischemic attack) and stroke 12/27/2013   Neck pain 12/27/2013   OSA (obstructive sleep apnea) 12/08/2013   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/31/2013   Depression with anxiety 08/04/2013   Protein C deficiency ([place]) 08/01/2013   Post traumatic stress disorder (PTSD) 08/01/2013   Speech abnormality 07/25/2013   Obesity 05/10/2013   Lower extremity edema 05/10/2013   GERD (gastroesophageal reflux disease) 05/10/2013   Arthritis 05/10/2013   OA (osteoarthritis) of knee 03/15/2013   Headache 10/25/2012   Palpitations 10/18/2012   Fatty liver determined by biopsy 06/01/2012   Arthropathic psoriasis, unspecified ([place]) 04/29/2012   Abnormal liver enzymes 03/29/2012   Psoriatic arthritis ([place]) 12/29/2011   Left Renal Hydronephrosis 12/11/2010   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   Celiac disease 05/27/2010   Thyroid nodule 05/27/2010   Male-to-male transgender person 09/20/2002     REFERRING PROVIDER: Meredith Staggers, MD  REFERRING DIAG: [phone] (ICD-10-CM) - Spondylosis of lumbar spine  Rationale for Evaluation and Treatment: Rehabilitation  THERAPY DIAG:  Other low back pain  Muscle weakness (generalized)  Difficulty in walking, not elsewhere classified  Stiffness of unspecified  joint, not elsewhere classified  ONSET DATE: Chronic pain / PT order 04/09/2022  SUBJECTIVE:                                                                                                                                                                                           SUBJECTIVE STATEMENT: Pt has a hx of chronic back pain. Pt takes Morphine twice per day, but tries not to take the oxycodone.  Pt had an ESI in oct 2022 and had minimal relief.  Pt has received PT over the years for different issues including aquatic therapy in the past for neuro rehab.  Pt received PT from 08/23 - 12/23 for cervicalgia and cervical spondylosis.  PT order from MD indicated:  Diagnosis: lumbar spondylosis.  Rx: Eval and Treat. Lumbar strengthening.rom.posture, modalities,educate on HEP.  Pt was using a power W/C until last August and the chair stopped working.  Pt was using the power W/C due to the TIA/CVA and LE weakness.  Pt used a walker though it bothered his wrists.  He has been walking some now without an AD.  "The more I walk freely, the tighter my back gets".  Pt states his legs give out when he walks.  He had 1 fall  in February.    Pt has pain with sitting.  He has pain with performing stairs especially descending.  Pt has pain with bending over and uses a reacher to pick up objects.  He has pain and limitations with twisting including turning in shower.  He has pain with picking up and carrying objects.  He has increased stiffness in AM. Pt has pain with rolling over in bed.  Pt has disturbed sleep due to pain.  He does yoga and his PT HEP.      PERTINENT HISTORY:  -Chronic pain  -CVA vs TIA in 2015 (Pt reports he has spasticity), neuropathy bilat feet, R sided heart failure and Pt uses lasix, Hx of DVT in R UE and bilat LE  -psoriatic arthritis which causes pain in multiple joints, OA, C4-6 syrinx and cervical spondylosis, psoriasis, and osteopenia   -Pancreatic insufficiency, celiac disease,  and IBS. -Anxiety and PTSD  -PSHx:  R Hip labral repair in 2015, R TKA in 2020, L TKR with 2 revisions with the last in 2018, R Ankle arthroscopy with reconstruction in 2007, Bilat shoulder surgery, R thumb replacement, and gender reconstruction surgery     PAIN:  Are you having pain? Yes Location:  lumbar bilat sides and central; currently in central and L sided lumbar   NPRS:  6/10 current, 4/10 best, and 9/10 worst  Thoracic pain 7/10 currently  PRECAUTIONS: Fall and Other: CVA/TIA, neuropathy, bilat TKR, Hx of DVT, psoriatic arthritis, ostepenia, and R hip labral repair   WEIGHT BEARING RESTRICTIONS: No  FALLS:  Has patient fallen in last 6 months? Yes. Number of falls 5-6, legs give out, dog made him fall once   OCCUPATION: Pt is on disability  PLOF: Pt has chronic pain, a CVA vs TIA, and other significant medical hx which has negatively affected his mobility and function.    PATIENT GOALS: wants to be able to roll over in bed with reduced pain, walking 25 ft without stopping, turning in shower without pain.  Reduced pain.   OBJECTIVE:   DIAGNOSTIC FINDINGS:  MD notes indicated MRI showed severe DDD at L5-S1. Left S1 radiculopathy? only mild foraminal stenosis however.  MRI in 2022:  IMPRESSION: 1. No impingement or inflammation to explain leg symptoms. 2. Focal advanced disc degeneration at L5-S1.  PATIENT SURVEYS:  FOTO will give next visit   COGNITION: Overall cognitive status: Within functional limits for tasks assessed      LUMBAR ROM:   AROM eval  Flexion 75% with pain  Extension   Right lateral flexion 75% with pain  Left lateral flexion 75% with pain  Right rotation WFL with pain  Left rotation 60% with pain   (Blank rows = not tested)   LOWER EXTREMITY MMT:    MMT Right eval Left eval  Hip flexion 4/5 shaky 4-/5 shaky  Hip extension    Hip abduction    Hip adduction    Hip internal rotation    Hip external rotation Unable to tolerate  resistance Unable to tolerate resistance  Knee flexion 4-/5 seated  4-/5 seated  Knee extension 3+/5 3+/5  Ankle dorsiflexion    Ankle plantarflexion    Ankle inversion    Ankle eversion     (Blank rows = not tested)  LUMBAR SPECIAL TESTS:  Supine SLR test: positive bilat   GAIT: Comments: Pt has lumbar flexion with gait.  Decreased step length bilat, limited TKE bilat.  Decreased gait speed.  Pt used the rail for  support for half the distance he walked.    TODAY'S TREATMENT:                                                                                                                               Pt was educated in correct performance and palpation of TrA contraction.  Pt performed supine TrA contractions.  Pt received a HEP handout and was educated in correct form and appropriate frequency.   See below for pt education.    PATIENT EDUCATION:  Education details: Educated pt concerning aquatic therapy and the benefits and rationale of aquatic therapy.  Dx, POC, HEP, relevant anatomy, rationale of exercises. Person educated: Patient Education method: Explanation, Demonstration, Verbal cues, and Handouts Education comprehension: verbalized understanding, returned demonstration, verbal cues required, and needs further education  HOME EXERCISE PROGRAM: Access Code: [email or web address] URL: [email or web address] Date: 05/15/2022 Prepared by: Ronny Flurry  Exercises - Supine Transversus Abdominis Bracing - Hands on Stomach  - 2-3 x daily - 7 x weekly - 1-2 sets - 10 reps  ASSESSMENT:  CLINICAL IMPRESSION: Patient presents to Rx with a dx of lumbar spondylosis and has a hx of chronic lumbar pain.  Pt has an extensive medical Hx.  Pt was using a power W/C until it stopped working last August.  He is currently ambulating without an AD though is very limited with ambulation distance.  Pt has pain and limitations with daily mobility including turning in shower and bending over to  try to pick up an object.  He has pain with lifting and carrying objects.  Pt has disturbed sleep and pain with rolling over in bed.  Pt reports weakness in legs and feels that his legs may give out.  Pt may benefit from skilled PT services to address impairments, reduce pain, and improve overall function.  OBJECTIVE IMPAIRMENTS: Abnormal gait, decreased activity tolerance, decreased balance, decreased endurance, decreased mobility, difficulty walking, decreased ROM, decreased strength, hypomobility, increased fascial restrictions, impaired flexibility, and pain.   ACTIVITY LIMITATIONS: carrying, lifting, bending, sitting, standing, squatting, sleeping, bed mobility, and locomotion level  PARTICIPATION LIMITATIONS: cleaning, shopping, and community activity  PERSONAL FACTORS: Time since onset of injury/illness/exacerbation and 3+ comorbidities: CVA/TIA, neuropathy bilat feet, R sided heart failure, OA, psoriatic arthritis, bilat TKR, R ankle surgery and R hip labral repair  are also affecting patient's functional outcome.   REHAB POTENTIAL: Good  CLINICAL DECISION MAKING: Evolving/moderate complexity  EVALUATION COMPLEXITY: Moderate   GOALS:  SHORT TERM GOALS: Target date: 06/05/2022   Pt will be independent and compliant with HEP for improved pain, strength, ROM, and function.  Baseline: Goal status: INITIAL  2.  Pt will report at least a 25% improvement in lumbar pain and sx's overall.  Baseline:  Goal status: INITIAL  3.  Pt will ambulate 100 ft with improved stability, increased speed, and reduced pain.  Baseline:  Goal status: INITIAL  4.  Pt will report improved strength in LE's with  ambulation and decreased sensation of legs wanting to give out.  Baseline:  Goal status: INITIAL   LONG TERM GOALS: Target date: 06/26/2022  Pt will demo lumbar AROM to be WFL in flex, Sb'ing, and rotation for improved stiffness and mobility.  Baseline:  Goal status: INITIAL  2.  Pt will  demo improved core strength as evidenced by progression of exercises without adverse effects and improved LE strength to 4/5 in knee extension and hip abd, 5/5 in hip flexion and knee extension for improved performance of functional mobility and functional lifting/carrying with reduced stress on lumbar.   Baseline:  Goal status: INITIAL  3.  Pt will report he is able to roll over in bed and turn in shower without significant pain.  Baseline:  Goal status: INITIAL  4.  Pt will be able to ambulate community distance without significant lumbar pain Baseline:  Goal status: INITIAL   PLAN:  PT FREQUENCY: 2x/week  PT DURATION: 6 weeks  PLANNED INTERVENTIONS: Therapeutic exercises, Therapeutic activity, Neuromuscular re-education, Balance training, Gait training, Patient/Family education, Self Care, Stair training, Aquatic Therapy, Dry Needling, Electrical stimulation, Cryotherapy, Moist heat, Taping, Ultrasound, Manual therapy, and Re-evaluation.  PLAN FOR NEXT SESSION: Give FOTO next visit.  Land visit next Rx.  Review TrA contractions and progress core exercises.  Plan to begin aquatic therapy in the next 2-4 visits.    Selinda Michaels III PT, DPT 05/16/22 10:47 PM

## 2022-05-16 ENCOUNTER — Encounter (HOSPITAL_BASED_OUTPATIENT_CLINIC_OR_DEPARTMENT_OTHER): Payer: Self-pay | Admitting: Physical Therapy

## 2022-05-20 ENCOUNTER — Other Ambulatory Visit (HOSPITAL_BASED_OUTPATIENT_CLINIC_OR_DEPARTMENT_OTHER): Payer: Self-pay

## 2022-05-20 DIAGNOSIS — M13841 Other specified arthritis, right hand: Secondary | ICD-10-CM | POA: Diagnosis not present

## 2022-05-20 MED ORDER — METHYLPREDNISOLONE 4 MG PO TBPK
ORAL_TABLET | ORAL | 0 refills | Status: DC
  Filled 2022-05-20: qty 21, 6d supply, fill #0

## 2022-05-21 ENCOUNTER — Other Ambulatory Visit (HOSPITAL_COMMUNITY): Payer: Self-pay

## 2022-05-21 ENCOUNTER — Other Ambulatory Visit: Payer: Self-pay

## 2022-05-21 DIAGNOSIS — F331 Major depressive disorder, recurrent, moderate: Secondary | ICD-10-CM | POA: Diagnosis not present

## 2022-05-21 DIAGNOSIS — F411 Generalized anxiety disorder: Secondary | ICD-10-CM | POA: Diagnosis not present

## 2022-05-21 MED FILL — Potassium Chloride Tab ER 10 mEq: ORAL | 90 days supply | Qty: 180 | Fill #2 | Status: AC

## 2022-05-22 ENCOUNTER — Other Ambulatory Visit (HOSPITAL_BASED_OUTPATIENT_CLINIC_OR_DEPARTMENT_OTHER): Payer: Self-pay

## 2022-05-22 ENCOUNTER — Other Ambulatory Visit: Payer: Self-pay

## 2022-05-24 ENCOUNTER — Encounter: Payer: Self-pay | Admitting: Cardiovascular Disease

## 2022-05-24 ENCOUNTER — Other Ambulatory Visit (HOSPITAL_COMMUNITY): Payer: Self-pay

## 2022-05-26 DIAGNOSIS — F649 Gender identity disorder, unspecified: Secondary | ICD-10-CM | POA: Diagnosis not present

## 2022-05-27 ENCOUNTER — Other Ambulatory Visit (HOSPITAL_BASED_OUTPATIENT_CLINIC_OR_DEPARTMENT_OTHER): Payer: Self-pay

## 2022-05-27 MED ORDER — CREON 24000-76000 UNITS PO CPEP
ORAL_CAPSULE | ORAL | 1 refills | Status: DC
  Filled 2022-05-27 – 2022-05-28 (×2): qty 540, 90d supply, fill #0

## 2022-05-28 ENCOUNTER — Other Ambulatory Visit: Payer: Self-pay

## 2022-05-28 ENCOUNTER — Other Ambulatory Visit (HOSPITAL_COMMUNITY): Payer: Self-pay

## 2022-05-28 ENCOUNTER — Other Ambulatory Visit (HOSPITAL_BASED_OUTPATIENT_CLINIC_OR_DEPARTMENT_OTHER): Payer: Self-pay

## 2022-05-28 DIAGNOSIS — F331 Major depressive disorder, recurrent, moderate: Secondary | ICD-10-CM | POA: Diagnosis not present

## 2022-05-28 DIAGNOSIS — F411 Generalized anxiety disorder: Secondary | ICD-10-CM | POA: Diagnosis not present

## 2022-05-28 DIAGNOSIS — K760 Fatty (change of) liver, not elsewhere classified: Secondary | ICD-10-CM | POA: Diagnosis not present

## 2022-05-29 ENCOUNTER — Other Ambulatory Visit (HOSPITAL_COMMUNITY): Payer: Self-pay

## 2022-05-29 ENCOUNTER — Other Ambulatory Visit (HOSPITAL_BASED_OUTPATIENT_CLINIC_OR_DEPARTMENT_OTHER): Payer: Self-pay

## 2022-05-30 ENCOUNTER — Other Ambulatory Visit: Payer: Self-pay

## 2022-05-30 DIAGNOSIS — R590 Localized enlarged lymph nodes: Secondary | ICD-10-CM | POA: Diagnosis not present

## 2022-05-30 DIAGNOSIS — R591 Generalized enlarged lymph nodes: Secondary | ICD-10-CM | POA: Diagnosis not present

## 2022-05-30 NOTE — Telephone Encounter (Signed)
You definitely want to see a nephrologist, not a urologist for that

## 2022-06-03 ENCOUNTER — Ambulatory Visit (HOSPITAL_BASED_OUTPATIENT_CLINIC_OR_DEPARTMENT_OTHER): Payer: Commercial Managed Care - PPO | Attending: Physical Medicine & Rehabilitation | Admitting: Physical Therapy

## 2022-06-03 DIAGNOSIS — M5459 Other low back pain: Secondary | ICD-10-CM | POA: Diagnosis not present

## 2022-06-03 DIAGNOSIS — R262 Difficulty in walking, not elsewhere classified: Secondary | ICD-10-CM | POA: Diagnosis not present

## 2022-06-03 DIAGNOSIS — M6281 Muscle weakness (generalized): Secondary | ICD-10-CM | POA: Insufficient documentation

## 2022-06-03 NOTE — Therapy (Signed)
OUTPATIENT PHYSICAL THERAPY TREATMENT NOTE   Patient Name: Jesse Bowers MRN: [id number] DOB:[birth year], 57 y.o., adult Today's Date: 06/04/2022  END OF SESSION:  PT End of Session - 06/03/22 1545     Visit Number 2    Number of Visits 12    Date for PT Re-Evaluation 06/26/22    Authorization Type Gretta Began    PT Start Time 1540    PT Stop Time 1625    PT Time Calculation (min) 45 min    Activity Tolerance Patient tolerated treatment well    Behavior During Therapy [facility] for tasks assessed/performed             Past Medical History:  Diagnosis Date   Abnormal weight loss    Anxiety    Arthritis    Cataract    OU   Celiac disease    Cervical neck pain with evidence of disc disease    patient has a cyst    Chronic constipation    Chronic diastolic heart failure    Pt. 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   Family history of adverse reaction to anesthesia    family has problems with anesthesia of nausea and vomiting    GERD (gastroesophageal reflux disease)    History of in 20's   Gluten enteropathy    H/O parotitis    right    Hard of hearing    History of kidney stones    History of retinal tear    Bilateral   History of staph infection    required wound vac   Hx-TIA (transient ischemic attack)    2015   Kidney stones 08/2020   LVH (left ventricular hypertrophy) 12/15/2016   Mild, noted on ECHO   MVP (mitral valve prolapse)    NAFL (nonalcoholic fatty liver)    Neck pain    Neuromuscular disorder    bilateral neuropathy feet.   Nuclear sclerotic cataract of both eyes 09/08/2019   Pneumonia 12/17/2010   Polycythemia    Polycythemia, secondary    PONV (postoperative nausea and vomiting)    Protein C deficiency    Dr. Thomes Cake    Psoriasis    16 X10 cm psoriatic rash on sole of left foot ; open and occ scant bleeding;    psoriatic arthritis    PTSD (post-traumatic stress disorder)    Scaphoid fracture of wrist 09/23/2013   Seizure    childhood, medication until age 70 then weanned completely off   Sleep apnea    split night study last done by Dr. Epimenio Foot 06/18/15 shows severe OSA, CSA, and hypersomnia, rec bipap   Splenomegaly    Stenosis of ureteropelvic junction (UPJ)    left   Stroke    CVA vs TIA in left cerebrum causing slight right sided weakness-Dr. 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    Wears glasses    Wears hearing aid    Past Surgical History:  Procedure Laterality Date   ABDOMINAL HYSTERECTOMY Bilateral 1994   TAH, BSO- tranverse incision at 57 yo   ANKLE ARTHROSCOPY WITH RECONSTRUCTION Right 2007   CHOLECYSTECTOMY     laparoscopic   COLONOSCOPY     x3   EYE SURGERY     Left eye 03/02/2018, right 02/15/2018   HEMORROIDECTOMY  08/19/2020   HIP ARTHROSCOPY W/ LABRAL REPAIR Right 05/11/2013   acetabular labral tear 03/30/2013   KNEE ARTHROPLASTY Right    KNEE JOINT MANIPULATION Left    x3 under anesthesia   KNEE SURGERY Bilateral 1984   Right ACL, left PCL repair   LITHOTRIPSY  2005   LIVER BIOPSY  2013   normal results.   MASTECTOMY Bilateral    prior to 2009   MOUTH SURGERY     NASAL SEPTUM SURGERY N/A 09/20/2015   by ENT Dr. Ezzard Standing   OVARIAN CYST SURGERY Left    size of grapefruit, was informed that she had shortened vagina   SHOULDER SURGERY Bilateral    Right 08/15/2016, Left 11/15/2016   THUMB ARTHROSCOPY Left    THYROIDECTOMY, PARTIAL Left 2008   TOTAL KNEE ARTHROPLASTY Right 08/23/2018   Procedure: TOTAL KNEE ARTHROPLASTY;  Surgeon: Ollen Gross, MD;  Location: [facility];   Service: Orthopedics;  Laterality: Right;    TOTAL KNEE REVISION Left 02/06/2016   Procedure: LEFT TOTAL KNEE REVISION;  Surgeon: Ollen Gross, MD;  Location: [facility];  Service: Orthopedics;  Laterality: Left;   TOTAL KNEE REVISION Left 04/22/2017   Procedure: Left knee polyethylene revision;  Surgeon: Ollen Gross, MD;  Location: [facility];  Service: Orthopedics;  Laterality: Left;   UPPER GI ENDOSCOPY  2003   Patient Active Problem List   Diagnosis Date Noted   Pancreatic insufficiency 04/09/2022   Moderate single current episode of major depressive disorder 03/27/2022   Posterior vitreous detachment of right eye 11/18/2021   Spondylosis of lumbar spine 10/03/2020   Pseudophakia of both eyes 05/07/2020   Retinal telangiectasis of both eyes 05/07/2020   Lattice degeneration of peripheral retina, left 09/08/2019   Lattice degeneration, right eye 09/08/2019   Chronic diastolic heart failure 05/24/2019   Urinary dysfunction 04/12/2019   Osteoarthritis of right knee 08/23/2018   Constipation due to opioid therapy 03/30/2018   SNHL (sensorineural hearing loss) 12/04/2017   Abnormal urinary stream 12/03/2017   Osteoarthritis of carpometacarpal (CMC) joint of thumb 11/30/2017   Muscle weakness 11/17/2017   Transient vision disturbance 11/12/2017   Bilateral hand pain 10/30/2017   Pain of left hip joint 10/09/2017   Gynecomastia 07/10/2017   Iron deficiency anemia 07/05/2017   Spasticity 05/20/2017   Lumbar radiculitis 04/20/2017   Ulnar 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, left 11/28/2016   Idiopathic peripheral neuropathy 11/28/2016   Failed total knee arthroplasty, 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   Long term (current) use of anticoagulants 08/23/2015   Right upper quadrant abdominal pain 08/23/2015   Memory loss 05/10/2015   Gait abnormality 04/07/2015   Alkaline phosphatase elevation 04/07/2015   History of thrombosis 03/26/2015   Medial epicondylitis 02/07/2015   Cognitive decline 12/21/2014    Leukopenia 12/05/2014   Rotator cuff syndrome of right shoulder 10/27/2014   Status post left knee replacement 08/22/2014   Left lateral epicondylitis 08/22/2014   Chronic cerebral ischemia 08/18/2014   Arthrofibrosis of knee joint 08/17/2014   Cubital canal compression syndrome, left 08/17/2014   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   Chronic pain syndrome 04/10/2014   Insomnia 04/10/2014   Chronic non-specific white matter lesions on  MRI 04/10/2014   CFS (chronic fatigue syndrome) 04/10/2014   Biceps tendonitis on left 03/01/2014   Polycythemia vera (HCC) 12/27/2013   H/O TIA (transient ischemic attack) and stroke 12/27/2013   Neck pain 12/27/2013   OSA (obstructive sleep apnea) 12/08/2013   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/31/2013   Depression with anxiety 08/04/2013   Protein C deficiency (HCC) 08/01/2013   Post traumatic stress disorder (PTSD) 08/01/2013   Speech abnormality 07/25/2013   Obesity 05/10/2013   Lower extremity edema 05/10/2013   GERD (gastroesophageal reflux disease) 05/10/2013   Arthritis 05/10/2013   OA (osteoarthritis) of knee 03/15/2013   Headache 10/25/2012   Palpitations 10/18/2012   Fatty liver determined by biopsy 06/01/2012   Arthropathic psoriasis, unspecified 04/29/2012   Abnormal liver enzymes 03/29/2012   Psoriatic arthritis 12/29/2011   Left Renal Hydronephrosis 12/11/2010   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   Celiac disease 05/27/2010   Thyroid nodule 05/27/2010   Male-to-male transgender person 09/20/2002     REFERRING PROVIDER: Ranelle Oyster, MD  REFERRING DIAG: [phone] (ICD-10-CM) - Spondylosis of lumbar spine  Rationale for Evaluation and Treatment: Rehabilitation  THERAPY DIAG:  Other low back pain  Muscle weakness (generalized)  Difficulty in walking, not elsewhere classified  Stiffness of unspecified joint, not elsewhere classified  ONSET DATE: Chronic pain / PT order 04/09/2022  SUBJECTIVE:                                                                                                                                                                                            SUBJECTIVE STATEMENT: Pt has pain with sitting.  He has pain with performing stairs especially descending.  Pt has pain with bending over and uses a reacher to pick up objects.  He has pain and limitations with twisting including turning in shower.  He has pain with picking up and carrying objects.  He has increased stiffness in AM. Pt has pain with rolling over in bed.  Pt has disturbed sleep due to pain.  Pt denies any adverse effects after prior Rx.  Pt reports he stepped in a divot in his yard earlier which tightened his back.  Pt states he has a new pain in his Lower L abdomen/hip flexor which started a couple of days ago.  He denies any specific injury.  Pt states he thinks he's in a rheumatoid flare due to the weather.      PERTINENT HISTORY:  -Chronic pain  -CVA vs TIA in 2015 (Pt reports he has spasticity), neuropathy bilat feet, R sided heart failure and Pt uses lasix, Hx of DVT in R UE and  bilat LE  -psoriatic arthritis which causes pain in multiple joints, OA, C4-6 syrinx and cervical spondylosis, psoriasis, and osteopenia   -Pancreatic insufficiency, celiac disease, and IBS. -Anxiety and PTSD  -PSHx:  R Hip labral repair in 2015, R TKA in 2020, L TKR with 2 revisions with the last in 2018, R Ankle arthroscopy with reconstruction in 2007, Bilat shoulder surgery, R thumb replacement, and gender reconstruction surgery     PAIN:  Are you having pain? Yes Location:  lumbar  NPRS:  6/10 current, 4/10 best, and 9/10 worst  Location:  L lower abdomen/proximal ant hip / L calf / toes bilat NPRS:  7/10  Thoracic pain 7/10 currently  PRECAUTIONS: Fall and Other: CVA/TIA, neuropathy, bilat TKR, Hx of DVT, psoriatic arthritis, ostepenia, and R hip labral repair   WEIGHT BEARING RESTRICTIONS: No  FALLS:  Has  patient fallen in last 6 months? Yes. Number of falls 5-6, legs give out, dog made him fall once   OCCUPATION: Pt is on disability  PLOF: Pt has chronic pain, a CVA vs TIA, and other significant medical hx which has negatively affected his mobility and function.    PATIENT GOALS: wants to be able to roll over in bed with reduced pain, walking 25 ft without stopping, turning in shower without pain.  Reduced pain.   OBJECTIVE:   DIAGNOSTIC FINDINGS:  MD notes indicated MRI showed severe DDD at L5-S1. Left S1 radiculopathy? only mild foraminal stenosis however.  MRI in 2022:  IMPRESSION: 1. No impingement or inflammation to explain leg symptoms. 2. Focal advanced disc degeneration at L5-S1.     TODAY'S TREATMENT:                                                                                                                               Reviewed current function, pain levels, HEP compliance and response to prior Rx.   Pt was educated in correct performance and palpation of TrA contraction.  Pt performed supine TrA contractions with and without 5 sec hold.   Pt performed:  Supine marching with TrA 2x10  Supine heel slides with TrA x 10, supine alt LE extension with TrA approx 4 reps  Supine shoulder flex/extension with TrA 2x10  Supine PPT 2x10  Supine manual HS stretch 2x20-30 sec  Seated HS stretch 2x20-30 sec  LAQ 2x10 each with 2.5# on R LE, 0# on L LE  Supine bridge 2x10  FOTO:  44 with a goal of 55 at visit #11    PT reviewed and updated HEP.  He received a HEP handout and was educated in correct form and appropriate frequency.     PATIENT EDUCATION:  Education details:  Dx, POC, HEP, relevant anatomy, and rationale of exercises. Person educated: Patient Education method: Explanation, Demonstration, Verbal cues, and Handouts  Education comprehension: verbalized understanding, returned demonstration, verbal cues required, and needs further education  HOME EXERCISE  PROGRAM: Access Code: [email or web address] URL: [email or web address]  Date: 05/15/2022 Prepared by: Aaron Edelman  Exercises - Supine Transversus Abdominis Bracing - Hands on Stomach  - 2-3 x daily - 7 x weekly - 1-2 sets - 10 reps  Updated HEP: - Supine March  - 1 x daily - 7 x weekly - 2 sets - 10 reps - Supine Posterior Pelvic Tilt  - 2 x daily - 7 x weekly - 2 sets - 10 reps - Seated Hamstring Stretch  - 2 x daily - 7 x weekly - 2 reps - 20-30 sec hold  ASSESSMENT:  CLINICAL IMPRESSION: PT worked on establishing HEP today.  Pt required cuing and instruction for correct form with supine TrA contractions.  He demonstrated improved form with cuing and instruction.  Pt performed core strengthening and HS flexibility exercises.  He has tightness in bilat HS.  Pt had L knee pain with supine heel slides and alt LE extension.  PT did not include in HEP.  PT updated HEP and gave pt a HEP handout.  Pt demonstrates good understanding of HEP.  Pt reports increased lumbar pain from 6/10 before Rx to 7/10 after Rx.  Pt may benefit from skilled PT services to address impairments, reduce pain, and improve overall function.   OBJECTIVE IMPAIRMENTS: Abnormal gait, decreased activity tolerance, decreased balance, decreased endurance, decreased mobility, difficulty walking, decreased ROM, decreased strength, hypomobility, increased fascial restrictions, impaired flexibility, and pain.   ACTIVITY LIMITATIONS: carrying, lifting, bending, sitting, standing, squatting, sleeping, bed mobility, and locomotion level  PARTICIPATION LIMITATIONS: cleaning, shopping, and community activity  PERSONAL FACTORS: Time since onset of injury/illness/exacerbation and 3+ comorbidities: CVA/TIA, neuropathy bilat feet, R sided heart failure, OA, psoriatic arthritis, bilat TKR, R ankle surgery and R hip labral repair  are also affecting patient's functional outcome.   REHAB POTENTIAL: Good  CLINICAL DECISION MAKING:  Evolving/moderate complexity  EVALUATION COMPLEXITY: Moderate   GOALS:  SHORT TERM GOALS: Target date: 06/05/2022   Pt will be independent and compliant with HEP for improved pain, strength, ROM, and function.  Baseline: Goal status: INITIAL  2.  Pt will report at least a 25% improvement in lumbar pain and sx's overall.  Baseline:  Goal status: INITIAL  3.  Pt will ambulate 100 ft with improved stability, increased speed, and reduced pain.  Baseline:  Goal status: INITIAL  4.  Pt will report improved strength in LE's with ambulation and decreased sensation of legs wanting to give out.  Baseline:  Goal status: INITIAL   LONG TERM GOALS: Target date: 06/26/2022  Pt will demo lumbar AROM to be WFL in flex, Sb'ing, and rotation for improved stiffness and mobility.  Baseline:  Goal status: INITIAL  2.  Pt will demo improved core strength as evidenced by progression of exercises without adverse effects and improved LE strength to 4/5 in knee extension and hip abd, 5/5 in hip flexion and knee extension for improved performance of functional mobility and functional lifting/carrying with reduced stress on lumbar.   Baseline:  Goal status: INITIAL  3.  Pt will report he is able to roll over in bed and turn in shower without significant pain.  Baseline:  Goal status: INITIAL  4.  Pt will be able to ambulate community distance without significant lumbar pain Baseline:  Goal status: INITIAL   PLAN:  PT FREQUENCY: 2x/week  PT DURATION: 6 weeks  PLANNED INTERVENTIONS: Therapeutic exercises, Therapeutic activity, Neuromuscular re-education, Balance training, Gait training, Patient/Family education, Self Care, Stair training, Aquatic Therapy, Dry Needling, Electrical stimulation, Cryotherapy, Moist  heat, Taping, Ultrasound, Manual therapy, and Re-evaluation.  PLAN FOR NEXT SESSION: Cont with core strengthening and HS flexibility exercises.  Plan to begin aquatic therapy in the next  1-3 visits.    Audie Clear III PT, DPT 06/04/22 6:08 PM

## 2022-06-04 ENCOUNTER — Encounter (HOSPITAL_BASED_OUTPATIENT_CLINIC_OR_DEPARTMENT_OTHER): Payer: Self-pay | Admitting: Physical Therapy

## 2022-06-04 ENCOUNTER — Other Ambulatory Visit (HOSPITAL_COMMUNITY): Payer: Self-pay

## 2022-06-04 DIAGNOSIS — F411 Generalized anxiety disorder: Secondary | ICD-10-CM | POA: Diagnosis not present

## 2022-06-04 DIAGNOSIS — F331 Major depressive disorder, recurrent, moderate: Secondary | ICD-10-CM | POA: Diagnosis not present

## 2022-06-05 ENCOUNTER — Other Ambulatory Visit (HOSPITAL_BASED_OUTPATIENT_CLINIC_OR_DEPARTMENT_OTHER): Payer: Self-pay

## 2022-06-05 ENCOUNTER — Telehealth: Payer: Self-pay | Admitting: *Deleted

## 2022-06-05 MED ORDER — HYDROXYZINE HCL 25 MG PO TABS
25.0000 mg | ORAL_TABLET | Freq: Four times a day (QID) | ORAL | 1 refills | Status: DC | PRN
  Filled 2022-06-06: qty 360, 90d supply, fill #0
  Filled 2022-06-06: qty 360, fill #0
  Filled 2022-08-11: qty 360, 90d supply, fill #0

## 2022-06-05 NOTE — Telephone Encounter (Signed)
Oral swab drug screen was consistent for prescribed medications.  ?

## 2022-06-06 ENCOUNTER — Encounter (HOSPITAL_BASED_OUTPATIENT_CLINIC_OR_DEPARTMENT_OTHER): Payer: Self-pay | Admitting: Physical Therapy

## 2022-06-06 ENCOUNTER — Other Ambulatory Visit (HOSPITAL_BASED_OUTPATIENT_CLINIC_OR_DEPARTMENT_OTHER): Payer: Self-pay

## 2022-06-06 ENCOUNTER — Other Ambulatory Visit: Payer: Self-pay | Admitting: Physical Medicine & Rehabilitation

## 2022-06-06 ENCOUNTER — Other Ambulatory Visit (HOSPITAL_COMMUNITY): Payer: Self-pay

## 2022-06-06 ENCOUNTER — Encounter: Payer: Self-pay | Admitting: Registered Nurse

## 2022-06-06 ENCOUNTER — Telehealth: Payer: Self-pay | Admitting: Registered Nurse

## 2022-06-06 ENCOUNTER — Other Ambulatory Visit: Payer: Self-pay

## 2022-06-06 ENCOUNTER — Encounter: Payer: Commercial Managed Care - PPO | Attending: Physical Medicine & Rehabilitation | Admitting: Registered Nurse

## 2022-06-06 VITALS — BP 132/93 | HR 99 | Ht 67.5 in | Wt 212.0 lb

## 2022-06-06 DIAGNOSIS — G8929 Other chronic pain: Secondary | ICD-10-CM

## 2022-06-06 DIAGNOSIS — M7062 Trochanteric bursitis, left hip: Secondary | ICD-10-CM | POA: Diagnosis not present

## 2022-06-06 DIAGNOSIS — G894 Chronic pain syndrome: Secondary | ICD-10-CM

## 2022-06-06 DIAGNOSIS — M25551 Pain in right hip: Secondary | ICD-10-CM

## 2022-06-06 DIAGNOSIS — L4050 Arthropathic psoriasis, unspecified: Secondary | ICD-10-CM | POA: Diagnosis not present

## 2022-06-06 DIAGNOSIS — M47816 Spondylosis without myelopathy or radiculopathy, lumbar region: Secondary | ICD-10-CM

## 2022-06-06 DIAGNOSIS — M25552 Pain in left hip: Secondary | ICD-10-CM | POA: Diagnosis not present

## 2022-06-06 DIAGNOSIS — M2550 Pain in unspecified joint: Secondary | ICD-10-CM | POA: Diagnosis not present

## 2022-06-06 DIAGNOSIS — M7061 Trochanteric bursitis, right hip: Secondary | ICD-10-CM | POA: Diagnosis not present

## 2022-06-06 DIAGNOSIS — Z5181 Encounter for therapeutic drug level monitoring: Secondary | ICD-10-CM

## 2022-06-06 DIAGNOSIS — G609 Hereditary and idiopathic neuropathy, unspecified: Secondary | ICD-10-CM

## 2022-06-06 DIAGNOSIS — M5416 Radiculopathy, lumbar region: Secondary | ICD-10-CM

## 2022-06-06 DIAGNOSIS — M546 Pain in thoracic spine: Secondary | ICD-10-CM | POA: Insufficient documentation

## 2022-06-06 MED ORDER — URSODIOL 500 MG PO TABS
500.0000 mg | ORAL_TABLET | Freq: Two times a day (BID) | ORAL | 11 refills | Status: DC
  Filled 2022-06-06: qty 60, 30d supply, fill #0
  Filled 2022-07-02: qty 60, 30d supply, fill #1
  Filled 2022-07-31: qty 180, 90d supply, fill #2

## 2022-06-06 MED ORDER — MORPHINE SULFATE ER 30 MG PO TBCR
30.0000 mg | EXTENDED_RELEASE_TABLET | Freq: Two times a day (BID) | ORAL | 0 refills | Status: DC
Start: 2022-06-06 — End: 2022-06-10
  Filled 2022-06-06: qty 60, 30d supply, fill #0

## 2022-06-06 NOTE — Progress Notes (Unsigned)
Subjective:    Patient ID: Jesse Bowers,  male   DOB: 1965-07-25, 57 y.o.   MRN: [id number]  HPI: Jesse Bowers is a 57 y.o.  male who returns for follow up appointment for chronic pain and medication refill. states *** pain is located in  ***. rates pain ***. current exercise regime is walking and performing stretching exercises.     Pain Inventory Average Pain 6 Pain Right Now 7 My pain is intermittent, sharp, burning, stabbing, tingling, and aching  LOCATION OF PAIN  neck shoulder wrist fingers back groin knee ankle toes  BOWEL Number of stools per week: 5 Oral laxative use Yes  Type of laxative Ibs related  BLADDER Normal  Mobility use a cane use a walker how many minutes can you walk? 10 ability to climb steps?  yes do you drive?  yes  Function disabled: date disabled 06/14/2012 I need assistance with the following:  dressing, bathing, meal prep, household duties, and shopping  Neuro/Psych weakness numbness tremor tingling trouble walking spasms dizziness anxiety loss of taste or smell  Prior Studies Any changes since last visit?  yes stuttering is worsening  Physicians involved in your care Any changes since last visit?  no   Family History  Problem Relation Age of Onset   Stroke Maternal Grandfather        55   Heart attack Maternal Grandfather    Glaucoma Maternal Grandfather    Macular degeneration Maternal Grandfather    Breast cancer Sister    Hypertension Mother    Psoriasis Mother    Other Mother        meningioma developed ~2019   Glaucoma Mother    Cancer Paternal Grandfather    Heart attack Paternal Grandfather    Stroke Paternal Uncle        age 52   Polycythemia Paternal Uncle    Stroke Maternal Grandmother    Congestive Heart Failure Maternal Grandmother    Heart attack Maternal Grandmother    Protein C deficiency Sister 35       Miscarriages   Breast cancer Maternal Aunt 35   Social History    Socioeconomic History   Marital status: Married    Spouse name: Not on file   Number of children: 2   Years of education: 4y college   Highest education level: Not on file  Occupational History   Occupation: Pediatric Nurse practitioner    Comment: Not working since CVA 2015  Tobacco Use   Smoking status: Never   Smokeless tobacco: Never  Vaping Use   Vaping Use: Never used  Substance and Sexual Activity   Alcohol use: Yes    Comment: social   Drug use: No   Sexual activity: Yes    Birth control/protection: None    Comment: patient is a transgender on testosterone shots, no biological kids  Other Topics Concern   Not on file  Social History Narrative   Not on file   Social Determinants of Health   Financial Resource Strain: Not on file  Food Insecurity: Not on file  Transportation Needs: Not on file  Physical Activity: Not on file  Stress: Not on file  Social Connections: Not on file   Past Surgical History:  Procedure Laterality Date   ABDOMINAL HYSTERECTOMY Bilateral 1994   TAH, BSO- tranverse incision at 57 yo   ANKLE ARTHROSCOPY WITH RECONSTRUCTION Right 2007   CHOLECYSTECTOMY     laparoscopic   COLONOSCOPY  x3   EYE SURGERY     Left eye 03/02/2018, right 02/15/2018   HEMORROIDECTOMY  08/19/2020   HIP ARTHROSCOPY W/ LABRAL REPAIR Right 05/11/2013   acetabular labral tear 03/30/2013   KNEE ARTHROPLASTY Right    KNEE JOINT MANIPULATION Left    x3 under anesthesia   KNEE SURGERY Bilateral 1984   Right ACL, left PCL repair   LITHOTRIPSY  2005   LIVER BIOPSY  2013   normal results.   MASTECTOMY Bilateral    prior to 2009   MOUTH SURGERY     NASAL SEPTUM SURGERY N/A 09/20/2015   by ENT Dr. Ezzard StandingNewman   OVARIAN CYST SURGERY Left    size of grapefruit, was informed that she had shortened vagina   SHOULDER SURGERY Bilateral    Right 08/15/2016, Left 11/15/2016   THUMB ARTHROSCOPY Left    THYROIDECTOMY, PARTIAL Left 2008   TOTAL KNEE ARTHROPLASTY Right  08/23/2018   Procedure: TOTAL KNEE ARTHROPLASTY;  Surgeon: Ollen GrossAluisio, Frank, MD;  Location: [facility];  Service: Orthopedics;  Laterality: Right;  50min   TOTAL KNEE REVISION Left 02/06/2016   Procedure: LEFT TOTAL KNEE REVISION;  Surgeon: Ollen GrossFrank Aluisio, MD;  Location: [facility];  Service: Orthopedics;  Laterality: Left;   TOTAL KNEE REVISION Left 04/22/2017   Procedure: Left knee polyethylene revision;  Surgeon: Ollen GrossAluisio, Frank, MD;  Location: [facility];  Service: Orthopedics;  Laterality: Left;   UPPER GI ENDOSCOPY  2003   Past Medical History:  Diagnosis Date   Abnormal weight loss    Anxiety    Arthritis    Cataract    OU   Celiac disease    Cervical neck pain with evidence of disc disease    patient has a cyst    Chronic constipation    Chronic diastolic heart failure    Pt. 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   Family history of adverse reaction to anesthesia    family has problems with anesthesia of nausea and vomiting    GERD (gastroesophageal reflux disease)    History of in 20's   Gluten enteropathy    H/O parotitis    right    Hard of hearing    History of kidney stones    History of retinal tear    Bilateral   History of staph infection    required wound vac   Hx-TIA (transient ischemic attack)    2015   Kidney stones 08/2020   LVH (left ventricular hypertrophy) 12/15/2016   Mild, noted on ECHO   MVP (mitral valve prolapse)    NAFL (nonalcoholic fatty liver)    Neck pain    Neuromuscular disorder    bilateral neuropathy feet.   Nuclear sclerotic cataract of both eyes 09/08/2019   Pneumonia 12/17/2010   Polycythemia    Polycythemia, secondary    PONV (postoperative nausea and vomiting)    Protein C deficiency    Dr. Thomes CakeFeng Follows   Psoriasis    16 X10  cm psoriatic rash on sole of left foot ; open and occ scant bleeding;    psoriatic arthritis    PTSD (post-traumatic stress disorder)    Scaphoid fracture of wrist 09/23/2013   Seizure  childhood, medication until age 34 then weanned completely off   Sleep apnea    split night study last done by Dr. Epimenio Foot 06/18/15 shows severe OSA, CSA, and hypersomnia, rec bipap   Splenomegaly    Stenosis of ureteropelvic junction (UPJ)    left   Stroke    CVA vs TIA in left cerebrum causing slight right sided weakness-Dr. 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    Wears glasses    Wears hearing aid    BP (!) 149/85   Pulse (!) 107   Ht 5' 7.5" (1.715 m)   Wt 212 lb (96.2 kg)   SpO2 96%   BMI 32.71 kg/m   Opioid Risk Score:   Fall Risk Score:  `1  Depression screen PHQ 2/9     06/06/2022    1:02 PM 04/09/2022    9:32 AM 03/27/2022    8:30 AM 02/05/2022    1:07 PM 10/02/2021    1:51 PM 06/04/2021    1:13 PM 04/03/2021    1:01 PM  Depression screen PHQ 2/9  Decreased Interest 0 0 0 0 0 0 1  Down, Depressed, Hopeless 0 0 0 0 0 0 1  PHQ - 2 Score 0 0 0 0 0 0 2  Altered sleeping  3 2      Tired, decreased energy  3 1      Change in appetite  0 0      Feeling bad or failure about yourself   0 0      Trouble concentrating  0 1      Moving slowly or fidgety/restless  0 0      Suicidal thoughts  0 0      PHQ-9 Score  6 4      Difficult doing work/chores  Somewhat difficult Somewhat difficult        Review of Systems  Constitutional:  Positive for diaphoresis.  HENT: Negative.    Eyes: Negative.   Respiratory:  Positive for apnea and shortness of breath.   Cardiovascular:  Positive for leg swelling.  Gastrointestinal:  Positive for abdominal pain, diarrhea and nausea.  Endocrine: Negative.    Genitourinary: Negative.   Musculoskeletal:  Positive for back pain, gait problem and neck pain.       Spasms  Skin:  Positive for rash.  Allergic/Immunologic: Negative.   Neurological:  Positive for dizziness, tremors, weakness and numbness.       Tingling  Psychiatric/Behavioral:  The patient is nervous/anxious.   All other systems reviewed and are negative.      Objective:   Physical Exam        Assessment & Plan:  1. Psoriatic arthritis with pain in multiple areas, most prominently feet, hands, elbows. Refilled: Kadian  30 mg 24 hr. Capsule, one capsule every 12 hours #60,  and Kadian 20 mg 24 hr capsule every 12 hours #60 and  Continue  Oxycodone 10 mg one tablet every 8 hours as needed #70. Second scripts sent for the following month. 08/12/2021. We will continue the opioid monitoring program, this consists of regular clinic visits, examinations, urine drug screen, pill counts as well as use of [place] Controlled Substance Reporting system. A 12 month History  has been reviewed on the [place] Controlled Substance Reporting System 08/12/2021. [name] Following.  2. Chronic Pain Syndrome: Continue Compound Cream. 08/12/2021 3. 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 08/12/2021 4. 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 08/12/2021. S/P TKR on 07/03/14:  Ortho Following: Dr. Lequita Halt perform Total Left Knee Revision on 02/06/2016. S/P Left Knee Revision on 04/22/17. S/P  Right TKA on 08/23/2018 with Dr. Lequita Halt. 5. Chronic mid- low back pain: Continue current medication regime, and encourage to increase activity as tolerated. 08/12/2021 6. Polycythemia: Oncology Following. 08/12/2021 7. Depression with anxiety : Psychiatry following.Continue to Monitor. 08/12/2021 8. Cervicalgia: Continue HEP  as tolerated  and Continue to Monitor. Continue current medication regime. 08/12/2021 9. Muscle Spasticity: Continue Baclofen 08/12/2021 10. Right Ankle Pain: No complaints Today. Continue to Monitor. 08/12/2021 10. Left lateral epicondylitis:Ortho Following. 08/12/2021 11. Bilateral Shoulder Pain: Continue HEP as Tolerated, Continue to Monitor. Ortho Following.08/12/2021 12. Lumbar Radiculitis: S/P  Left L5-S1 Translaminar  Injection on 07/09/2018. We will continue to Monitor. 08/12/2021  13.Bilateral Greater Trochanteric Bursitis: Continue with Ice and Heat Therapy: Contiue HEP as Tolerated. Continue to Monitor. 08/12/2021 14. Fall at [facility] in February. No Falls since last visit. Orthopedist Following. Educated on falls prevention. He verbalizes understanding.  Continue to Monitor. 08/12/2021     F/U in 2 months

## 2022-06-06 NOTE — Patient Instructions (Signed)
Seen for Face to Face Power Wheelchair Evaluation: Referral sent to  [facility]/   Call them in a week if you haven't heard from them

## 2022-06-06 NOTE — Telephone Encounter (Signed)
Carlyn sent a My--Chart message reporting Right hip pain after leaving office. X-ray ordered. He was instructed to go to ED or Urgent Care for evaluation if pain persists.

## 2022-06-07 ENCOUNTER — Other Ambulatory Visit (HOSPITAL_BASED_OUTPATIENT_CLINIC_OR_DEPARTMENT_OTHER): Payer: Self-pay

## 2022-06-09 DIAGNOSIS — D751 Secondary polycythemia: Secondary | ICD-10-CM | POA: Diagnosis not present

## 2022-06-09 DIAGNOSIS — E611 Iron deficiency: Secondary | ICD-10-CM | POA: Diagnosis not present

## 2022-06-10 ENCOUNTER — Ambulatory Visit (HOSPITAL_BASED_OUTPATIENT_CLINIC_OR_DEPARTMENT_OTHER): Payer: Commercial Managed Care - PPO | Admitting: Physical Therapy

## 2022-06-10 ENCOUNTER — Telehealth: Payer: Self-pay | Admitting: Registered Nurse

## 2022-06-10 ENCOUNTER — Other Ambulatory Visit (HOSPITAL_BASED_OUTPATIENT_CLINIC_OR_DEPARTMENT_OTHER): Payer: Self-pay

## 2022-06-10 ENCOUNTER — Encounter (HOSPITAL_BASED_OUTPATIENT_CLINIC_OR_DEPARTMENT_OTHER): Payer: Self-pay | Admitting: Physical Therapy

## 2022-06-10 DIAGNOSIS — M6281 Muscle weakness (generalized): Secondary | ICD-10-CM | POA: Diagnosis not present

## 2022-06-10 DIAGNOSIS — M5459 Other low back pain: Secondary | ICD-10-CM

## 2022-06-10 DIAGNOSIS — R262 Difficulty in walking, not elsewhere classified: Secondary | ICD-10-CM

## 2022-06-10 DIAGNOSIS — M47816 Spondylosis without myelopathy or radiculopathy, lumbar region: Secondary | ICD-10-CM

## 2022-06-10 DIAGNOSIS — L408 Other psoriasis: Secondary | ICD-10-CM | POA: Diagnosis not present

## 2022-06-10 MED ORDER — MORPHINE SULFATE ER 30 MG PO TBCR
30.0000 mg | EXTENDED_RELEASE_TABLET | Freq: Two times a day (BID) | ORAL | 0 refills | Status: DC
Start: 2022-07-03 — End: 2022-08-04
  Filled 2022-06-10 – 2022-07-04 (×2): qty 60, 30d supply, fill #0

## 2022-06-10 NOTE — Telephone Encounter (Signed)
PMP was Reviewed.  MS Contin e-scribed to pharmacy Jesse Bowers is aware via MY- Chart message.

## 2022-06-10 NOTE — Therapy (Signed)
OUTPATIENT PHYSICAL THERAPY TREATMENT NOTE   Patient Name: Jesse Bowers MRN: [id number] DOB:22-Jun-1965, 57 y.o., adult Today's Date: 06/11/2022  END OF SESSION:  PT End of Session - 06/10/22 1324     Visit Number 3    Number of Visits 12    Date for PT Re-Evaluation 06/26/22    Authorization Type Gretta Began    PT Start Time 1323   Pt arrived late to treatement today   PT Stop Time 1405    PT Time Calculation (min) 42 min    Activity Tolerance Patient tolerated treatment well    Behavior During Therapy [facility] for tasks assessed/performed             Past Medical History:  Diagnosis Date   Abnormal weight loss    Anxiety    Arthritis    Cataract    OU   Celiac disease    Cervical neck pain with evidence of disc disease    patient has a cyst    Chronic constipation    Chronic diastolic heart failure    Pt. 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   Family history of adverse reaction to anesthesia    family has problems with anesthesia of nausea and vomiting    GERD (gastroesophageal reflux disease)    History of in 20's   Gluten enteropathy    H/O parotitis    right    Hard of hearing    History of kidney stones    History of retinal tear    Bilateral   History of staph infection    required wound vac   Hx-TIA (transient ischemic attack)    2015   Kidney stones 08/2020   LVH (left ventricular hypertrophy) 12/15/2016   Mild, noted on ECHO   MVP (mitral valve prolapse)    NAFL (nonalcoholic fatty liver)    Neck pain    Neuromuscular disorder    bilateral neuropathy feet.   Nuclear sclerotic cataract of both eyes 09/08/2019   Pneumonia 12/17/2010   Polycythemia    Polycythemia, secondary    PONV (postoperative nausea and vomiting)     Protein C deficiency    Dr. Thomes Cake   Psoriasis    16 X10 cm psoriatic rash on sole of left foot ; open and occ scant bleeding;    psoriatic arthritis    PTSD (post-traumatic stress disorder)    Scaphoid fracture of wrist 09/23/2013   Seizure    childhood, medication until age 3 then weanned completely off   Sleep apnea    split night study last done by Dr. Epimenio Foot 06/18/15 shows severe OSA, CSA, and hypersomnia, rec bipap   Splenomegaly    Stenosis of ureteropelvic junction (UPJ)    left   Stroke    CVA vs TIA in left cerebrum causing slight right sided weakness-Dr. 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    Wears glasses    Wears hearing aid  Past Surgical History:  Procedure Laterality Date   ABDOMINAL HYSTERECTOMY Bilateral 1994   TAH, BSO- tranverse incision at 57 yo   ANKLE ARTHROSCOPY WITH RECONSTRUCTION Right 2007   CHOLECYSTECTOMY     laparoscopic   COLONOSCOPY     x3   EYE SURGERY     Left eye 03/02/2018, right 02/15/2018   HEMORROIDECTOMY  08/19/2020   HIP ARTHROSCOPY W/ LABRAL REPAIR Right 05/11/2013   acetabular labral tear 03/30/2013   KNEE ARTHROPLASTY Right    KNEE JOINT MANIPULATION Left    x3 under anesthesia   KNEE SURGERY Bilateral 1984   Right ACL, left PCL repair   LITHOTRIPSY  2005   LIVER BIOPSY  2013   normal results.   MASTECTOMY Bilateral    prior to 2009   MOUTH SURGERY     NASAL SEPTUM SURGERY N/A 09/20/2015   by ENT Dr. Ezzard Standing   OVARIAN CYST SURGERY Left    size of grapefruit, was informed that she had shortened vagina   SHOULDER SURGERY Bilateral    Right 08/15/2016, Left 11/15/2016   THUMB ARTHROSCOPY Left    THYROIDECTOMY, PARTIAL Left 2008   TOTAL KNEE ARTHROPLASTY Right 08/23/2018   Procedure: TOTAL KNEE ARTHROPLASTY;  Surgeon:  Ollen Gross, MD;  Location: [facility];  Service: Orthopedics;  Laterality: Right;    TOTAL KNEE REVISION Left 02/06/2016   Procedure: LEFT TOTAL KNEE REVISION;  Surgeon: Ollen Gross, MD;  Location: [facility];  Service: Orthopedics;  Laterality: Left;   TOTAL KNEE REVISION Left 04/22/2017   Procedure: Left knee polyethylene revision;  Surgeon: Ollen Gross, MD;  Location: [facility];  Service: Orthopedics;  Laterality: Left;   UPPER GI ENDOSCOPY  2003   Patient Active Problem List   Diagnosis Date Noted   Pancreatic insufficiency 04/09/2022   Moderate single current episode of major depressive disorder 03/27/2022   Posterior vitreous detachment of right eye 11/18/2021   Spondylosis of lumbar spine 10/03/2020   Pseudophakia of both eyes 05/07/2020   Retinal telangiectasis of both eyes 05/07/2020   Lattice degeneration of peripheral retina, left 09/08/2019   Lattice degeneration, right eye 09/08/2019   Chronic diastolic heart failure 05/24/2019   Urinary dysfunction 04/12/2019   Osteoarthritis of right knee 08/23/2018   Constipation due to opioid therapy 03/30/2018   SNHL (sensorineural hearing loss) 12/04/2017   Abnormal urinary stream 12/03/2017   Osteoarthritis of carpometacarpal (CMC) joint of thumb 11/30/2017   Muscle weakness 11/17/2017   Transient vision disturbance 11/12/2017   Bilateral hand pain 10/30/2017   Pain of left hip joint 10/09/2017   Gynecomastia 07/10/2017   Iron deficiency anemia 07/05/2017   Spasticity 05/20/2017   Lumbar radiculitis 04/20/2017   Ulnar 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, left 11/28/2016   Idiopathic peripheral neuropathy 11/28/2016   Failed total knee arthroplasty, 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   Long term (current) use of anticoagulants 08/23/2015   Right upper quadrant abdominal pain 08/23/2015   Memory loss 05/10/2015   Gait abnormality 04/07/2015   Alkaline phosphatase elevation 04/07/2015   History of thrombosis 03/26/2015   Medial epicondylitis 02/07/2015    Cognitive decline 12/21/2014   Leukopenia 12/05/2014   Rotator cuff syndrome of right shoulder 10/27/2014   Status post left knee replacement 08/22/2014   Left lateral epicondylitis 08/22/2014   Chronic cerebral ischemia 08/18/2014   Arthrofibrosis of knee joint 08/17/2014   Cubital canal compression syndrome, left 08/17/2014   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   Chronic pain syndrome 04/10/2014   Insomnia 04/10/2014   Chronic non-specific  white matter lesions on MRI 04/10/2014   CFS (chronic fatigue syndrome) 04/10/2014   Biceps tendonitis on left 03/01/2014   Polycythemia vera (HCC) 12/27/2013   H/O TIA (transient ischemic attack) and stroke 12/27/2013   Neck pain 12/27/2013   OSA (obstructive sleep apnea) 12/08/2013   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/31/2013   Depression with anxiety 08/04/2013   Protein C deficiency (HCC) 08/01/2013   Post traumatic stress disorder (PTSD) 08/01/2013   Speech abnormality 07/25/2013   Obesity 05/10/2013   Lower extremity edema 05/10/2013   GERD (gastroesophageal reflux disease) 05/10/2013   Arthritis 05/10/2013   OA (osteoarthritis) of knee 03/15/2013   Headache 10/25/2012   Palpitations 10/18/2012   Fatty liver determined by biopsy 06/01/2012   Arthropathic psoriasis, unspecified 04/29/2012   Abnormal liver enzymes 03/29/2012   Psoriatic arthritis 12/29/2011   Left Renal Hydronephrosis 12/11/2010   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   Celiac disease 05/27/2010   Thyroid nodule 05/27/2010   Male-to-male transgender person 09/20/2002     REFERRING PROVIDER: Ranelle Oyster, MD  REFERRING DIAG: [phone] (ICD-10-CM) - Spondylosis of lumbar spine  Rationale for Evaluation and Treatment: Rehabilitation  THERAPY DIAG:  Other low back pain  Muscle weakness (generalized)  Difficulty in walking, not elsewhere classified  Stiffness of unspecified joint, not elsewhere classified  ONSET DATE: Chronic pain / PT  order 04/09/2022  SUBJECTIVE:                                                                                                                                                                                           SUBJECTIVE STATEMENT: Pt states he has felt some clunking in hips especially with the stairs.  It also feels "shifty" when he is walking.  He is having an x ray of his hips today.  Pt reports having increased back pain and feeling tighter after prior Rx.  He had increased pain with marching at home.  Pt messaged PT and PT messaged pt in return instructing him to stop the exercise that increased pain.  PT instructed pt to stop exercise has pain with sitting.  He has pain with performing stairs especially descending.  Pt has pain with bending over and uses a reacher to pick up objects.  He has pain and limitations with twisting including turning in shower.  He has pain with picking up and carrying objects.  He has increased stiffness in AM.  Pt has pain with rolling over in bed.  Pt has disturbed sleep due to pain.   PERTINENT HISTORY:  -Chronic pain  -CVA vs TIA in 2015 (Pt reports he has spasticity), neuropathy bilat  feet, R sided heart failure and Pt uses lasix, Hx of DVT in R UE and bilat LE  -psoriatic arthritis which causes pain in multiple joints, OA, C4-6 syrinx and cervical spondylosis, psoriasis, and osteopenia   -Pancreatic insufficiency, celiac disease, and IBS. -Anxiety and PTSD  -PSHx:  R Hip labral repair in 2015, R TKA in 2020, L TKR with 2 revisions with the last in 2018, R Ankle arthroscopy with reconstruction in 2007, Bilat shoulder surgery, R thumb replacement, and gender reconstruction surgery     PAIN:  Are you having pain? Yes Location: bilat sides of lumbar and mid thoracic  NPRS:  6/10 current, 4/10 best, and 9/10 worst   PRECAUTIONS: Fall and Other: CVA/TIA, neuropathy, bilat TKR, Hx of DVT, psoriatic arthritis, ostepenia, and R hip labral repair    WEIGHT BEARING RESTRICTIONS: No  FALLS:  Has patient fallen in last 6 months? Yes. Number of falls 5-6, legs give out, dog made him fall once   OCCUPATION: Pt is on disability  PLOF: Pt has chronic pain, a CVA vs TIA, and other significant medical hx which has negatively affected his mobility and function.    PATIENT GOALS: wants to be able to roll over in bed with reduced pain, walking 25 ft without stopping, turning in shower without pain.  Reduced pain.   OBJECTIVE:   DIAGNOSTIC FINDINGS:  MD notes indicated MRI showed severe DDD at L5-S1. Left S1 radiculopathy? only mild foraminal stenosis however.  MRI in 2022:  IMPRESSION: 1. No impingement or inflammation to explain leg symptoms. 2. Focal advanced disc degeneration at L5-S1.     TODAY'S TREATMENT:                                                                                                                               Reviewed current function, pain levels, HEP compliance and response to prior Rx.  Reviewed HEP.  Pt was educated in correct performance and palpation of TrA contraction.  Pt performed supine TrA contractions with 5 sec hold.   Pt performed:  Supine shoulder flex/extension with ball with TrA 2x10  Supine PPT x 10  Supine manual HS stretch 2x20-30 sec  Seated HS stretch 2x20-30 sec  LAQ 2x10 each with 2.5# on R LE, 0# on L LE  Supine bridge 2x10  Standing rows with GTB 2x10 with TrA  See below for pt education.  Manual Therapy: Pt received STM to bilat lumbar paraspinals in prone to improve pain, soft tissue tightness, and mobility.     PATIENT EDUCATION:  Education details:  PT answered questions and used pictures on the internet to educate pt concerning relevant anatomy including TrA.  Educated pt concerning HEP and instructed pt to not perform supine marching at home.   Dx, POC, HEP, relevant anatomy, and rationale of exercises. Person educated: Patient Education method: Explanation,  Demonstration, Verbal cues, and Handouts  Education comprehension: verbalized understanding, returned demonstration, verbal cues required,  and needs further education  HOME EXERCISE PROGRAM: Access Code: [email or web address] URL: [email or web address] Date: 05/15/2022 Prepared by: Aaron Edelman  Exercises - Supine Transversus Abdominis Bracing - Hands on Stomach  - 2-3 x daily - 7 x weekly - 1-2 sets - 10 reps - Supine March  - 1 x daily - 7 x weekly - 2 sets - 10 reps - Supine Posterior Pelvic Tilt  - 2 x daily - 7 x weekly - 2 sets - 10 reps - Seated Hamstring Stretch  - 2 x daily - 7 x weekly - 2 reps - 20-30 sec hold - Supine shoulder flex/ext with TrA once per day 2x10  ASSESSMENT:  CLINICAL IMPRESSION: PT reviewed HEP today and instructed pt in home exercises.  PT instructed pt to stop any exercises that cause pain which would be supine marching.  He felt fine with the other home exercises.  Pt required cuing and instruction for correct form with supine TrA contractions though has improved form.  Pt has been working on TrA contractions and has been performing his HEP.  Pt's L HS is tighter than R.  Pt has deficits in L knee extension ROM due to his L TKR.  He performed exercises well and responded well to Rx stating he felt better and reported improved pain from 6/10 before Rx to 3/10 after Rx.  Pt may benefit from cont skilled PT services to address impairments, reduce pain, and improve overall function.   OBJECTIVE IMPAIRMENTS: Abnormal gait, decreased activity tolerance, decreased balance, decreased endurance, decreased mobility, difficulty walking, decreased ROM, decreased strength, hypomobility, increased fascial restrictions, impaired flexibility, and pain.   ACTIVITY LIMITATIONS: carrying, lifting, bending, sitting, standing, squatting, sleeping, bed mobility, and locomotion level  PARTICIPATION LIMITATIONS: cleaning, shopping, and community activity  PERSONAL FACTORS: Time  since onset of injury/illness/exacerbation and 3+ comorbidities: CVA/TIA, neuropathy bilat feet, R sided heart failure, OA, psoriatic arthritis, bilat TKR, R ankle surgery and R hip labral repair  are also affecting patient's functional outcome.   REHAB POTENTIAL: Good  CLINICAL DECISION MAKING: Evolving/moderate complexity  EVALUATION COMPLEXITY: Moderate   GOALS:  SHORT TERM GOALS: Target date: 06/05/2022   Pt will be independent and compliant with HEP for improved pain, strength, ROM, and function.  Baseline: Goal status: INITIAL  2.  Pt will report at least a 25% improvement in lumbar pain and sx's overall.  Baseline:  Goal status: INITIAL  3.  Pt will ambulate 100 ft with improved stability, increased speed, and reduced pain.  Baseline:  Goal status: INITIAL  4.  Pt will report improved strength in LE's with ambulation and decreased sensation of legs wanting to give out.  Baseline:  Goal status: INITIAL   LONG TERM GOALS: Target date: 06/26/2022  Pt will demo lumbar AROM to be WFL in flex, Sb'ing, and rotation for improved stiffness and mobility.  Baseline:  Goal status: INITIAL  2.  Pt will demo improved core strength as evidenced by progression of exercises without adverse effects and improved LE strength to 4/5 in knee extension and hip abd, 5/5 in hip flexion and knee extension for improved performance of functional mobility and functional lifting/carrying with reduced stress on lumbar.   Baseline:  Goal status: INITIAL  3.  Pt will report he is able to roll over in bed and turn in shower without significant pain.  Baseline:  Goal status: INITIAL  4.  Pt will be able to ambulate community distance without significant lumbar pain Baseline:  Goal status: INITIAL  PLAN:  PT FREQUENCY: 2x/week  PT DURATION: 6 weeks  PLANNED INTERVENTIONS: Therapeutic exercises, Therapeutic activity, Neuromuscular re-education, Balance training, Gait training, Patient/Family  education, Self Care, Stair training, Aquatic Therapy, Dry Needling, Electrical stimulation, Cryotherapy, Moist heat, Taping, Ultrasound, Manual therapy, and Re-evaluation.  PLAN FOR NEXT SESSION: Cont with core strengthening and HS flexibility exercises.  Plan to begin aquatic therapy in the next 1-3 visits.    Audie Clear III PT, DPT 06/11/22 9:27 PM

## 2022-06-11 DIAGNOSIS — F331 Major depressive disorder, recurrent, moderate: Secondary | ICD-10-CM | POA: Diagnosis not present

## 2022-06-11 DIAGNOSIS — F411 Generalized anxiety disorder: Secondary | ICD-10-CM | POA: Diagnosis not present

## 2022-06-12 ENCOUNTER — Encounter: Payer: Self-pay | Admitting: Family Medicine

## 2022-06-12 ENCOUNTER — Ambulatory Visit: Payer: Commercial Managed Care - PPO | Admitting: Family Medicine

## 2022-06-12 ENCOUNTER — Ambulatory Visit (HOSPITAL_BASED_OUTPATIENT_CLINIC_OR_DEPARTMENT_OTHER): Payer: Commercial Managed Care - PPO | Admitting: Physical Therapy

## 2022-06-12 ENCOUNTER — Encounter (HOSPITAL_BASED_OUTPATIENT_CLINIC_OR_DEPARTMENT_OTHER): Payer: Self-pay

## 2022-06-12 VITALS — BP 102/80 | HR 105 | Temp 98.7°F | Ht 67.5 in | Wt 209.4 lb

## 2022-06-12 DIAGNOSIS — M25562 Pain in left knee: Secondary | ICD-10-CM | POA: Diagnosis not present

## 2022-06-12 DIAGNOSIS — G8929 Other chronic pain: Secondary | ICD-10-CM | POA: Diagnosis not present

## 2022-06-12 DIAGNOSIS — M25561 Pain in right knee: Secondary | ICD-10-CM | POA: Diagnosis not present

## 2022-06-12 NOTE — Progress Notes (Signed)
   Acute Office Visit  Subjective:     Patient ID: Jesse Bowers, adult    DOB: 1966-01-14, 57 y.o.   MRN: [id number]  Chief Complaint  Patient presents with   Knee Pain    Bilateral knee pain x months, worse past 3 weeks    Knee Pain    Patient is in today for chronic BL knee pain. He states that it has been progressively worsening, for the past few months, and especially in the past few weeks. States that both knees have been replaced and he even had a revision on the left knee.  Has been going to physical therapy, states that when he shakes his leg he can hear the hardware sloshing around in his knee. He was seeing [facility], Dr. Lovey Newcomer but he does not want to go back to him. Would like to see someone else. States that there is swelling around his knees, but no redness, no warmth to the touch, no fever or chills.   Review of Systems  All other systems reviewed and are negative.       Objective:    BP 102/80 (BP Location: Left Arm, Patient Position: Sitting, Cuff Size: Normal)   Pulse (!) 105   Temp 98.7 F (37.1 C) (Oral)   Ht 5' 7.5" (1.715 m)   Wt 209 lb 6.4 oz (95 kg)   SpO2 99%   BMI 32.31 kg/m    Physical Exam Vitals reviewed.  Constitutional:      Appearance: Normal appearance. He is normal weight.  Eyes:     Conjunctiva/sclera: Conjunctivae normal.  Musculoskeletal:     Right knee: Swelling present. No erythema. No tenderness.     Left knee: Swelling present. No deformity or erythema. No tenderness.     Comments: Exam is difficult due to prior surgeries, there is some swelling on the right lateral knee and BL left knee  Neurological:     Mental Status: He is alert and oriented to person, place, and time. Mental status is at baseline.  Psychiatric:        Mood and Affect: Mood normal.        Behavior: Behavior normal.     No results found for any visits on 06/12/22.      Assessment & Plan:   Problem List Items Addressed This Visit    None Visit Diagnoses     Chronic pain of both knees    -  Primary   Relevant Orders   Patient requesting a referral to another orthopedist, I have placed a referral in the computer. I will check a CBC and CRP to look for signs of infection.   Ambulatory referral to Orthopedic Surgery   CBC with Differential/Platelets   C-reactive Protein       No orders of the defined types were placed in this encounter.   No follow-ups on file.  Karie Georges, MD

## 2022-06-13 ENCOUNTER — Ambulatory Visit
Admission: RE | Admit: 2022-06-13 | Discharge: 2022-06-13 | Disposition: A | Discharge status: Home / Self Care | Payer: Commercial Managed Care - PPO | Source: Ambulatory Visit | Attending: Registered Nurse | Admitting: Registered Nurse

## 2022-06-13 ENCOUNTER — Encounter: Payer: Self-pay | Admitting: Family Medicine

## 2022-06-13 DIAGNOSIS — M25552 Pain in left hip: Secondary | ICD-10-CM | POA: Diagnosis not present

## 2022-06-13 DIAGNOSIS — M25551 Pain in right hip: Secondary | ICD-10-CM | POA: Diagnosis not present

## 2022-06-13 LAB — CBC WITH DIFFERENTIAL/PLATELET
Basophils Absolute: 0 10*3/uL (ref 0.0–0.1)
Basophils Relative: 0.3 % (ref 0.0–3.0)
Eosinophils Absolute: 0 10*3/uL (ref 0.0–0.7)
Eosinophils Relative: 0.1 % (ref 0.0–5.0)
HCT: 48 % (ref 39.0–52.0)
Hemoglobin: 15.5 g/dL (ref 13.0–17.0)
Lymphocytes Relative: 18.2 % (ref 12.0–46.0)
Lymphs Abs: 0.8 10*3/uL (ref 0.7–4.0)
MCHC: 32.3 g/dL (ref 30.0–36.0)
MCV: 77.5 fl — ABNORMAL LOW (ref 78.0–100.0)
Monocytes Absolute: 0.5 10*3/uL (ref 0.1–1.0)
Monocytes Relative: 11.6 % (ref 3.0–12.0)
Neutro Abs: 3.1 10*3/uL (ref 1.4–7.7)
Neutrophils Relative %: 69.8 % (ref 43.0–77.0)
Platelets: 225 10*3/uL (ref 150.0–400.0)
RBC: 6.19 Mil/uL — ABNORMAL HIGH (ref 4.22–5.81)
RDW: 17.4 % — ABNORMAL HIGH (ref 11.5–15.5)
WBC: 4.5 10*3/uL (ref 4.0–10.5)

## 2022-06-13 LAB — C-REACTIVE PROTEIN: CRP: <1 mg/dL (ref 0.5–20.0)

## 2022-06-16 ENCOUNTER — Other Ambulatory Visit: Payer: Self-pay | Admitting: Physical Medicine & Rehabilitation

## 2022-06-16 ENCOUNTER — Other Ambulatory Visit (HOSPITAL_BASED_OUTPATIENT_CLINIC_OR_DEPARTMENT_OTHER): Payer: Self-pay

## 2022-06-16 ENCOUNTER — Encounter (HOSPITAL_BASED_OUTPATIENT_CLINIC_OR_DEPARTMENT_OTHER): Payer: Self-pay | Admitting: Physical Therapy

## 2022-06-16 ENCOUNTER — Ambulatory Visit (HOSPITAL_BASED_OUTPATIENT_CLINIC_OR_DEPARTMENT_OTHER): Payer: Commercial Managed Care - PPO | Admitting: Physical Therapy

## 2022-06-16 DIAGNOSIS — R262 Difficulty in walking, not elsewhere classified: Secondary | ICD-10-CM

## 2022-06-16 DIAGNOSIS — M6281 Muscle weakness (generalized): Secondary | ICD-10-CM

## 2022-06-16 DIAGNOSIS — M5459 Other low back pain: Secondary | ICD-10-CM

## 2022-06-16 DIAGNOSIS — G894 Chronic pain syndrome: Secondary | ICD-10-CM

## 2022-06-16 DIAGNOSIS — M5416 Radiculopathy, lumbar region: Secondary | ICD-10-CM

## 2022-06-16 MED ORDER — BACLOFEN 20 MG PO TABS
20.0000 mg | ORAL_TABLET | Freq: Four times a day (QID) | ORAL | 2 refills | Status: DC
Start: 2022-06-16 — End: 2023-02-03
  Filled 2022-06-16: qty 450, 57d supply, fill #0
  Filled 2022-08-11: qty 450, 90d supply, fill #1
  Filled 2022-11-10: qty 450, 100d supply, fill #2

## 2022-06-16 NOTE — Therapy (Addendum)
OUTPATIENT PHYSICAL THERAPY TREATMENT NOTE   Patient Name: Jesse Bowers MRN: [id number] DOB:June 27, 1965, 57 y.o., adult Today's Date: 06/16/2022  END OF SESSION:  PT End of Session - 06/16/22 1125     Visit Number 4    Number of Visits 12    Date for PT Re-Evaluation 06/26/22    Authorization Type [facility] AET[employer]    PT Start Time 1123   Pt arrived to treatment 23 mins late.   PT Stop Time 1153    PT Time Calculation (min) 30 min    Activity Tolerance Patient tolerated treatment well    Behavior During Therapy WFL for tasks assessed/performed             Past Medical History:  Diagnosis Date   Abnormal weight loss    Anxiety    Arthritis    Cataract    OU   Celiac disease    Cervical neck pain with evidence of disc disease    patient has a cyst    Chronic constipation    Chronic diastolic heart failure    Pt. 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   Family history of adverse reaction to anesthesia    family has problems with anesthesia of nausea and vomiting    GERD (gastroesophageal reflux disease)    History of in 20's   Gluten enteropathy    H/O parotitis    right    Hard of hearing    History of kidney stones    History of retinal tear    Bilateral   History of staph infection    required wound vac   Hx-TIA (transient ischemic attack)    2015   Kidney stones 08/2020   LVH (left ventricular hypertrophy) 12/15/2016   Mild, noted on ECHO   MVP (mitral valve prolapse)    NAFL (nonalcoholic fatty liver)    Neck pain    Neuromuscular disorder    bilateral neuropathy feet.   Nuclear sclerotic cataract of both eyes 09/08/2019   Pneumonia 12/17/2010   Polycythemia    Polycythemia, secondary    PONV (postoperative nausea and vomiting)     Protein C deficiency    Dr. Thomes Cake   Psoriasis    16 X10 cm psoriatic rash on sole of left foot ; open and occ scant bleeding;    psoriatic arthritis    PTSD (post-traumatic stress disorder)    Scaphoid fracture of wrist 09/23/2013   Seizure    childhood, medication until age 59 then weanned completely off   Sleep apnea    split night study last done by Dr. Epimenio Foot 06/18/15 shows severe OSA, CSA, and hypersomnia, rec bipap   Splenomegaly    Stenosis of ureteropelvic junction (UPJ)    left   Stroke    CVA vs TIA in left cerebrum causing slight right sided weakness-Dr. 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    Wears glasses    Wears hearing  aid    Past Surgical History:  Procedure Laterality Date   ABDOMINAL HYSTERECTOMY Bilateral 1994   TAH, BSO- tranverse incision at 57 yo   ANKLE ARTHROSCOPY WITH RECONSTRUCTION Right 2007   CHOLECYSTECTOMY     laparoscopic   COLONOSCOPY     x3   EYE SURGERY     Left eye 03/02/2018, right 02/15/2018   HEMORROIDECTOMY  08/19/2020   HIP ARTHROSCOPY W/ LABRAL REPAIR Right 05/11/2013   acetabular labral tear 03/30/2013   KNEE ARTHROPLASTY Right    KNEE JOINT MANIPULATION Left    x3 under anesthesia   KNEE SURGERY Bilateral 1984   Right ACL, left PCL repair   LITHOTRIPSY  2005   LIVER BIOPSY  2013   normal results.   MASTECTOMY Bilateral    prior to 2009   MOUTH SURGERY     NASAL SEPTUM SURGERY N/A 09/20/2015   by ENT Dr. Ezzard Standing   OVARIAN CYST SURGERY Left    size of grapefruit, was informed that she had shortened vagina   SHOULDER SURGERY Bilateral    Right 08/15/2016, Left 11/15/2016   THUMB ARTHROSCOPY Left    THYROIDECTOMY, PARTIAL Left 2008   TOTAL KNEE ARTHROPLASTY Right 08/23/2018   Procedure: TOTAL KNEE ARTHROPLASTY;  Surgeon:  Ollen Gross, MD;  Location: [facility];  Service: Orthopedics;  Laterality: Right;    TOTAL KNEE REVISION Left 02/06/2016   Procedure: LEFT TOTAL KNEE REVISION;  Surgeon: Ollen Gross, MD;  Location: [facility];  Service: Orthopedics;  Laterality: Left;   TOTAL KNEE REVISION Left 04/22/2017   Procedure: Left knee polyethylene revision;  Surgeon: Ollen Gross, MD;  Location: [facility];  Service: Orthopedics;  Laterality: Left;   UPPER GI ENDOSCOPY  2003   Patient Active Problem List   Diagnosis Date Noted   Pancreatic insufficiency 04/09/2022   Moderate single current episode of major depressive disorder 03/27/2022   Posterior vitreous detachment of right eye 11/18/2021   Spondylosis of lumbar spine 10/03/2020   Pseudophakia of both eyes 05/07/2020   Retinal telangiectasis of both eyes 05/07/2020   Lattice degeneration of peripheral retina, left 09/08/2019   Lattice degeneration, right eye 09/08/2019   Chronic diastolic heart failure 05/24/2019   Urinary dysfunction 04/12/2019   Osteoarthritis of right knee 08/23/2018   Constipation due to opioid therapy 03/30/2018   SNHL (sensorineural hearing loss) 12/04/2017   Abnormal urinary stream 12/03/2017   Osteoarthritis of carpometacarpal (CMC) joint of thumb 11/30/2017   Muscle weakness 11/17/2017   Transient vision disturbance 11/12/2017   Bilateral hand pain 10/30/2017   Pain of left hip joint 10/09/2017   Gynecomastia 07/10/2017   Iron deficiency anemia 07/05/2017   Spasticity 05/20/2017   Lumbar radiculitis 04/20/2017   Ulnar 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, left 11/28/2016   Idiopathic peripheral neuropathy 11/28/2016   Failed total knee arthroplasty, 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   Long term (current) use of anticoagulants 08/23/2015   Right upper quadrant abdominal pain 08/23/2015   Memory loss 05/10/2015   Gait abnormality 04/07/2015   Alkaline phosphatase elevation 04/07/2015   History of thrombosis 03/26/2015   Medial epicondylitis 02/07/2015    Cognitive decline 12/21/2014   Leukopenia 12/05/2014   Rotator cuff syndrome of right shoulder 10/27/2014   Status post left knee replacement 08/22/2014   Left lateral epicondylitis 08/22/2014   Chronic cerebral ischemia 08/18/2014   Arthrofibrosis of knee joint 08/17/2014   Cubital canal compression syndrome, left 08/17/2014   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   Chronic pain syndrome 04/10/2014   Insomnia 04/10/2014  Chronic non-specific white matter lesions on MRI 04/10/2014   CFS (chronic fatigue syndrome) 04/10/2014   Biceps tendonitis on left 03/01/2014   Polycythemia vera (HCC) 12/27/2013   H/O TIA (transient ischemic attack) and stroke 12/27/2013   Neck pain 12/27/2013   OSA (obstructive sleep apnea) 12/08/2013   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/31/2013   Depression with anxiety 08/04/2013   Protein C deficiency (HCC) 08/01/2013   Post traumatic stress disorder (PTSD) 08/01/2013   Speech abnormality 07/25/2013   Obesity 05/10/2013   Lower extremity edema 05/10/2013   GERD (gastroesophageal reflux disease) 05/10/2013   Arthritis 05/10/2013   OA (osteoarthritis) of knee 03/15/2013   Headache 10/25/2012   Palpitations 10/18/2012   Fatty liver determined by biopsy 06/01/2012   Arthropathic psoriasis, unspecified 04/29/2012   Abnormal liver enzymes 03/29/2012   Psoriatic arthritis 12/29/2011   Left Renal Hydronephrosis 12/11/2010   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   Celiac disease 05/27/2010   Thyroid nodule 05/27/2010   Male-to-male transgender person 09/20/2002     REFERRING PROVIDER: Ranelle Oyster, MD  REFERRING DIAG: [phone] (ICD-10-CM) - Spondylosis of lumbar spine  Rationale for Evaluation and Treatment: Rehabilitation  THERAPY DIAG:  Other low back pain  Muscle weakness (generalized)  Difficulty in walking, not elsewhere classified  Stiffness of unspecified joint, not elsewhere classified  ONSET DATE: Chronic pain / PT  order 04/09/2022  SUBJECTIVE:                                                                                                                                                                                           SUBJECTIVE STATEMENT: Pt denies any adverse effects after prior Rx and felt good with STM.  Pt reports compliance with HEP and tolerance of HEP.  Pt states he was kicking around piles of grass to spread it out while his wife was cutting grass which significantly increased his R sided back pain.  Pt had x rays for L hip though has not found out results yet.  Pt had to miss his last PT appt which was an aquatic treatment due to being sick.     PERTINENT HISTORY:  -Chronic pain  -CVA vs TIA in 2015 (Pt reports he has spasticity), neuropathy bilat feet, R sided heart failure and Pt uses lasix, Hx of DVT in R UE and bilat LE  -psoriatic arthritis which causes pain in multiple joints, OA, C4-6 syrinx and cervical spondylosis, psoriasis, and osteopenia   -Pancreatic insufficiency, celiac disease, and IBS. -Anxiety and PTSD  -PSHx:  R Hip labral repair in 2015, R TKA in 2020, L TKR with 2 revisions with the  last in 2018, R Ankle arthroscopy with reconstruction in 2007, Bilat shoulder surgery, R thumb replacement, and gender reconstruction surgery     PAIN:  Are you having pain? Yes Location: R side of lumbar   NPRS:  7/10 current, 4/10 best, and 9/10 worst   PRECAUTIONS: Fall and Other: CVA/TIA, neuropathy, bilat TKR, Hx of DVT, psoriatic arthritis, ostepenia, and R hip labral repair   WEIGHT BEARING RESTRICTIONS: No  FALLS:  Has patient fallen in last 6 months? Yes. Number of falls 5-6, legs give out, dog made him fall once   OCCUPATION: Pt is on disability  PLOF: Pt has chronic pain, a CVA vs TIA, and other significant medical hx which has negatively affected his mobility and function.    PATIENT GOALS: wants to be able to roll over in bed with reduced pain, walking 25 ft  without stopping, turning in shower without pain.  Reduced pain.   OBJECTIVE:   DIAGNOSTIC FINDINGS:  MD notes indicated MRI showed severe DDD at L5-S1. Left S1 radiculopathy? only mild foraminal stenosis however.  MRI in 2022:  IMPRESSION: 1. No impingement or inflammation to explain leg symptoms. 2. Focal advanced disc degeneration at L5-S1.     TODAY'S TREATMENT:                                                                                                                               Reviewed pain levels, HEP compliance and response to prior Rx.   Pt performed:  Supine shoulder flex/extension with 2# with TrA 2x10  Supine PPT x 10  Supine bent knee fall out with TrA R LE 2x10  LAQ 2x10 2.5# on R LE, 0# on L LE x 10 and 1# 2x10  Supine bridge 2x10   See below for pt education.  Manual Therapy: Pt received STM to bilat lumbar paraspinals in prone to improve pain, soft tissue tightness, and mobility.     PATIENT EDUCATION:  Education details:  Instructed pt to get clearance from surgeon when he can return to PT and pt verbalizes agreement.  POC, HEP, relevant anatomy, exercise form, and rationale of exercises. Person educated: Patient Education method: Explanation, Demonstration, Verbal cues, and Handouts  Education comprehension: verbalized understanding, returned demonstration, verbal cues required, and needs further education  HOME EXERCISE PROGRAM: Access Code: [email or web address] URL: [email or web address] Date: 05/15/2022 Prepared by: Aaron Edelman  Exercises - Supine Transversus Abdominis Bracing - Hands on Stomach  - 2-3 x daily - 7 x weekly - 1-2 sets - 10 reps - Supine March  - 1 x daily - 7 x weekly - 2 sets - 10 reps - Supine Posterior Pelvic Tilt  - 2 x daily - 7 x weekly - 2 sets - 10 reps - Seated Hamstring Stretch  - 2 x daily - 7 x weekly - 2 reps - 20-30 sec hold - Supine shoulder flex/ext with TrA once per day 2x10  ASSESSMENT:  CLINICAL  IMPRESSION: Treatment time limited today due to pt arriving late.  Pt thought his appt was at 11:15, though is his appt time was at 11:00.  Pt performed exercises well with cuing and instruction for correct form.  Pt has tightness in R > L sided lumbar paraspinals.  Pt responded well to Rx stating he felt better reporting improved pain from 7/10 before Rx to 5/10 after Rx.  Pt should benefit from cont skilled PT services to address impairments, reduce pain, and improve overall function.    OBJECTIVE IMPAIRMENTS: Abnormal gait, decreased activity tolerance, decreased balance, decreased endurance, decreased mobility, difficulty walking, decreased ROM, decreased strength, hypomobility, increased fascial restrictions, impaired flexibility, and pain.   ACTIVITY LIMITATIONS: carrying, lifting, bending, sitting, standing, squatting, sleeping, bed mobility, and locomotion level  PARTICIPATION LIMITATIONS: cleaning, shopping, and community activity  PERSONAL FACTORS: Time since onset of injury/illness/exacerbation and 3+ comorbidities: CVA/TIA, neuropathy bilat feet, R sided heart failure, OA, psoriatic arthritis, bilat TKR, R ankle surgery and R hip labral repair  are also affecting patient's functional outcome.   REHAB POTENTIAL: Good  CLINICAL DECISION MAKING: Evolving/moderate complexity  EVALUATION COMPLEXITY: Moderate   GOALS:  SHORT TERM GOALS: Target date: 06/05/2022   Pt will be independent and compliant with HEP for improved pain, strength, ROM, and function.  Baseline: Goal status: INITIAL  2.  Pt will report at least a 25% improvement in lumbar pain and sx's overall.  Baseline:  Goal status: INITIAL  3.  Pt will ambulate 100 ft with improved stability, increased speed, and reduced pain.  Baseline:  Goal status: INITIAL  4.  Pt will report improved strength in LE's with ambulation and decreased sensation of legs wanting to give out.  Baseline:  Goal status: INITIAL   LONG  TERM GOALS: Target date: 06/26/2022  Pt will demo lumbar AROM to be WFL in flex, Sb'ing, and rotation for improved stiffness and mobility.  Baseline:  Goal status: INITIAL  2.  Pt will demo improved core strength as evidenced by progression of exercises without adverse effects and improved LE strength to 4/5 in knee extension and hip abd, 5/5 in hip flexion and knee extension for improved performance of functional mobility and functional lifting/carrying with reduced stress on lumbar.   Baseline:  Goal status: INITIAL  3.  Pt will report he is able to roll over in bed and turn in shower without significant pain.  Baseline:  Goal status: INITIAL  4.  Pt will be able to ambulate community distance without significant lumbar pain Baseline:  Goal status: INITIAL   PLAN:  PT FREQUENCY: 2x/week  PT DURATION: 6 weeks  PLANNED INTERVENTIONS: Therapeutic exercises, Therapeutic activity, Neuromuscular re-education, Balance training, Gait training, Patient/Family education, Self Care, Stair training, Aquatic Therapy, Dry Needling, Electrical stimulation, Cryotherapy, Moist heat, Taping, Ultrasound, Manual therapy, and Re-evaluation.  PLAN FOR NEXT SESSION: Cont with core strengthening and HS flexibility exercises.  Pt is having surgery next week and will let PT know when he is cleared for PT.     Audie Clear III PT, DPT 06/16/22 9:04 PM  PHYSICAL THERAPY DISCHARGE SUMMARY  Visits from Start of Care: 4  Current functional level related to goals / functional outcomes: Unable to assess due to pt not being present at discharge.   Remaining deficits:    Education / Equipment:    Patient was seen in PT from 05/15/22 to 06/16/2022.  Pt had surgery and was planning on returning to PT when cleared from MD.  Pt  messaged PT stating he had issues with wound healing and returning to PT would be longer than planned.  PT messaged pt back explaining POC and discharge and pt was agreeable.  Pt to be  discharged from skilled PT services due to change in medical status.  He may return to PT when cleared by MD.   Audie Clear III PT, DPT 09/05/22 8:50 AM

## 2022-06-17 ENCOUNTER — Telehealth: Payer: Self-pay | Admitting: Registered Nurse

## 2022-06-17 DIAGNOSIS — F411 Generalized anxiety disorder: Secondary | ICD-10-CM | POA: Diagnosis not present

## 2022-06-17 DIAGNOSIS — F331 Major depressive disorder, recurrent, moderate: Secondary | ICD-10-CM | POA: Diagnosis not present

## 2022-06-17 NOTE — Telephone Encounter (Signed)
Call placed to [facility],  X-ray results was reviewed, he verbalizes understanding.

## 2022-06-18 ENCOUNTER — Other Ambulatory Visit (HOSPITAL_BASED_OUTPATIENT_CLINIC_OR_DEPARTMENT_OTHER): Payer: Self-pay

## 2022-06-18 DIAGNOSIS — N4883 Acquired buried penis: Secondary | ICD-10-CM | POA: Diagnosis not present

## 2022-06-18 DIAGNOSIS — F640 Transsexualism: Secondary | ICD-10-CM | POA: Diagnosis not present

## 2022-06-18 DIAGNOSIS — K219 Gastro-esophageal reflux disease without esophagitis: Secondary | ICD-10-CM | POA: Diagnosis not present

## 2022-06-18 DIAGNOSIS — G4730 Sleep apnea, unspecified: Secondary | ICD-10-CM | POA: Diagnosis not present

## 2022-06-18 DIAGNOSIS — K769 Liver disease, unspecified: Secondary | ICD-10-CM | POA: Diagnosis not present

## 2022-06-18 DIAGNOSIS — Z8673 Personal history of transient ischemic attack (TIA), and cerebral infarction without residual deficits: Secondary | ICD-10-CM | POA: Diagnosis not present

## 2022-06-18 DIAGNOSIS — N189 Chronic kidney disease, unspecified: Secondary | ICD-10-CM | POA: Diagnosis not present

## 2022-06-18 DIAGNOSIS — G950 Syringomyelia and syringobulbia: Secondary | ICD-10-CM | POA: Diagnosis not present

## 2022-06-18 DIAGNOSIS — L4050 Arthropathic psoriasis, unspecified: Secondary | ICD-10-CM | POA: Diagnosis not present

## 2022-06-18 MED ORDER — DOCUSATE SODIUM 100 MG PO CAPS
100.0000 mg | ORAL_CAPSULE | Freq: Every day | ORAL | 0 refills | Status: DC
  Filled 2022-06-18: qty 30, 30d supply, fill #0

## 2022-06-18 MED ORDER — OXYCODONE HCL 5 MG PO TABS
5.0000 mg | ORAL_TABLET | ORAL | 0 refills | Status: AC | PRN
  Filled 2022-06-18: qty 10, 2d supply, fill #0

## 2022-06-18 MED ORDER — SENNOSIDES 8.6 MG PO TABS
1.0000 | ORAL_TABLET | Freq: Every day | ORAL | 0 refills | Status: DC
  Filled 2022-06-18: qty 30, 30d supply, fill #0

## 2022-06-19 ENCOUNTER — Encounter: Payer: Self-pay | Admitting: Physical Medicine & Rehabilitation

## 2022-06-19 ENCOUNTER — Other Ambulatory Visit (HOSPITAL_BASED_OUTPATIENT_CLINIC_OR_DEPARTMENT_OTHER): Payer: Self-pay

## 2022-06-19 ENCOUNTER — Telehealth: Payer: Self-pay | Admitting: Registered Nurse

## 2022-06-19 ENCOUNTER — Other Ambulatory Visit: Payer: Self-pay

## 2022-06-19 NOTE — Telephone Encounter (Signed)
Delos Haring Underwent : On 06/18/2022 At [facility] NSRT TESTICULAR PROSTH [facility])  Gender dysphoria  06/18/2022 2:55 PM EDTSCROTOPLASTY; COMPLICATED  Gender dysphoria  06/18/2022 2:55 PM EDT  PMP was Reviewed.   He was given Permission to take his Oxycodone 10 mg plus 5 mg from [facility] every 6- 8  hours as needed for 7 days.  Message sent via My-Chart message.

## 2022-06-20 ENCOUNTER — Other Ambulatory Visit (HOSPITAL_BASED_OUTPATIENT_CLINIC_OR_DEPARTMENT_OTHER): Payer: Self-pay

## 2022-06-20 MED ORDER — TAMSULOSIN HCL 0.4 MG PO CAPS
0.4000 mg | ORAL_CAPSULE | Freq: Every day | ORAL | 0 refills | Status: DC
  Filled 2022-06-20: qty 30, 30d supply, fill #0

## 2022-06-21 ENCOUNTER — Encounter (HOSPITAL_BASED_OUTPATIENT_CLINIC_OR_DEPARTMENT_OTHER): Payer: Self-pay | Admitting: Physical Therapy

## 2022-06-24 ENCOUNTER — Other Ambulatory Visit (HOSPITAL_COMMUNITY): Payer: Self-pay

## 2022-06-25 ENCOUNTER — Other Ambulatory Visit (HOSPITAL_BASED_OUTPATIENT_CLINIC_OR_DEPARTMENT_OTHER): Payer: Self-pay

## 2022-06-25 ENCOUNTER — Encounter (HOSPITAL_BASED_OUTPATIENT_CLINIC_OR_DEPARTMENT_OTHER): Payer: Self-pay | Admitting: Physical Therapy

## 2022-06-25 ENCOUNTER — Other Ambulatory Visit (HOSPITAL_COMMUNITY): Payer: Self-pay

## 2022-06-25 DIAGNOSIS — F331 Major depressive disorder, recurrent, moderate: Secondary | ICD-10-CM | POA: Diagnosis not present

## 2022-06-25 DIAGNOSIS — F411 Generalized anxiety disorder: Secondary | ICD-10-CM | POA: Diagnosis not present

## 2022-06-26 ENCOUNTER — Encounter (HOSPITAL_BASED_OUTPATIENT_CLINIC_OR_DEPARTMENT_OTHER): Payer: Commercial Managed Care - PPO | Admitting: Physical Therapy

## 2022-06-27 ENCOUNTER — Other Ambulatory Visit (HOSPITAL_BASED_OUTPATIENT_CLINIC_OR_DEPARTMENT_OTHER): Payer: Self-pay

## 2022-06-30 ENCOUNTER — Other Ambulatory Visit: Payer: Self-pay

## 2022-06-30 ENCOUNTER — Other Ambulatory Visit (HOSPITAL_BASED_OUTPATIENT_CLINIC_OR_DEPARTMENT_OTHER): Payer: Self-pay

## 2022-06-30 DIAGNOSIS — Z09 Encounter for follow-up examination after completed treatment for conditions other than malignant neoplasm: Secondary | ICD-10-CM | POA: Diagnosis not present

## 2022-06-30 DIAGNOSIS — F649 Gender identity disorder, unspecified: Secondary | ICD-10-CM | POA: Diagnosis not present

## 2022-07-01 ENCOUNTER — Encounter (HOSPITAL_BASED_OUTPATIENT_CLINIC_OR_DEPARTMENT_OTHER): Payer: Commercial Managed Care - PPO | Admitting: Physical Therapy

## 2022-07-03 ENCOUNTER — Encounter (HOSPITAL_BASED_OUTPATIENT_CLINIC_OR_DEPARTMENT_OTHER): Payer: Commercial Managed Care - PPO | Admitting: Physical Therapy

## 2022-07-04 ENCOUNTER — Other Ambulatory Visit (HOSPITAL_BASED_OUTPATIENT_CLINIC_OR_DEPARTMENT_OTHER): Payer: Self-pay

## 2022-07-07 ENCOUNTER — Ambulatory Visit (INDEPENDENT_AMBULATORY_CARE_PROVIDER_SITE_OTHER): Payer: Commercial Managed Care - PPO | Admitting: Family Medicine

## 2022-07-07 ENCOUNTER — Other Ambulatory Visit (HOSPITAL_BASED_OUTPATIENT_CLINIC_OR_DEPARTMENT_OTHER): Payer: Self-pay

## 2022-07-07 ENCOUNTER — Encounter: Payer: Self-pay | Admitting: Family Medicine

## 2022-07-07 VITALS — BP 122/82 | HR 100 | Temp 98.3°F | Ht 67.5 in | Wt 212.3 lb

## 2022-07-07 DIAGNOSIS — R635 Abnormal weight gain: Secondary | ICD-10-CM | POA: Diagnosis not present

## 2022-07-07 DIAGNOSIS — T50905A Adverse effect of unspecified drugs, medicaments and biological substances, initial encounter: Secondary | ICD-10-CM | POA: Diagnosis not present

## 2022-07-07 DIAGNOSIS — H66003 Acute suppurative otitis media without spontaneous rupture of ear drum, bilateral: Secondary | ICD-10-CM

## 2022-07-07 MED ORDER — METFORMIN HCL 500 MG PO TABS
500.0000 mg | ORAL_TABLET | Freq: Two times a day (BID) | ORAL | 1 refills | Status: DC
Start: 2022-07-07 — End: 2022-12-16
  Filled 2022-07-07: qty 120, 60d supply, fill #0
  Filled 2022-07-07: qty 60, 30d supply, fill #0
  Filled 2022-09-23: qty 180, 90d supply, fill #1

## 2022-07-07 MED ORDER — CEFDINIR 300 MG PO CAPS
300.0000 mg | ORAL_CAPSULE | Freq: Two times a day (BID) | ORAL | 0 refills | Status: DC
Start: 2022-07-07 — End: 2022-07-15
  Filled 2022-07-07: qty 20, 10d supply, fill #0

## 2022-07-07 NOTE — Progress Notes (Unsigned)
Established Patient Office Visit  Subjective   Patient ID: Daquawn Hagedorn, adult    DOB: 1966-02-28  Age: 57 y.o. MRN: [id number]  No chief complaint on file.   Patient is reporting a new symptom of ear clogging, states that he has psoriasis in his ears and he would like to have his ears cleaned today. States he is having decreased hearing on both sides and is itchy. He denies any fever.chills.   Pt reports that he has an open wound from his genital reconstruction surgery. Has been seeing his surgeon for this, has ben using wet to dry dressings to keep it covered.   Patient is also concerned about weight gain. He states that he is trying to watch his diet and exercise, however he has psoriatic arthritis so exercise is limited/ difficult. We discussed his medications and how they could be causing his weight gain.     Current Outpatient Medications  Medication Instructions   baclofen (LIORESAL) 20-40 mg, Oral, 4 times daily, 1 tablet with breakfast, lunch, and dinner and 2 tablets at bedtime   cefdinir (OMNICEF) 300 mg, Oral, 2 times daily   Clobetasol Propionate Emulsion 0.05 % topical foam Apply a thin layer to the affected area(s) on the scalp   desonide (DESOWEN) 0.05 % cream Apply to affected area(s) once daily.   desonide (DESOWEN) 0.05 % cream Apply to affected area(s) twice daily   desvenlafaxine (PRISTIQ) 100 MG 24 hr tablet Take 1 tablet by mouth once daily.   desvenlafaxine (PRISTIQ) 100 mg, Oral, Daily   desvenlafaxine (PRISTIQ) 50 mg, Oral, Daily   fluocinonide-emollient (LIDEX-E) 0.05 % cream 1 Application, Topical, 2 times daily   folic acid (KP FOLIC ACID) 1 MG tablet Take 1 tablet po every day   furosemide (LASIX) 80 mg, Oral, Daily   furosemide (LASIX) 20 mg, Oral, Daily PRN   hydrOXYzine (ATARAX) 25 MG tablet Take 1 tablet (25 mg total) by mouth 4 (four) times daily as needed for anxiety.   Ibsrela 50 mg, Oral, 2 times daily   metFORMIN (GLUCOPHAGE) 500 mg,  Oral, 2 times daily with meals   methotrexate (RHEUMATREX) 15 mg, Oral, Weekly   metoprolol tartrate (LOPRESSOR) 25 mg, Oral, 2 times daily PRN   morphine (MS CONTIN) 30 mg, Oral, Every 12 hours   OVER THE COUNTER MEDICATION Tums-1000mg  once a day   Oxycodone HCl 10 mg, Oral, Every 6 hours PRN   Pancrelipase, Lip-Prot-Amyl, (CREON) 24000-76000 units CPEP Take 1 -2 capsules (24,000-48,000 units of lipase total) by mouth at the beginning of each meal.   potassium chloride (KLOR-CON) 10 MEQ tablet Take 2 tablets by mouth every day.   Roflumilast, Antiseborrheic, (ZORYVE) 0.3 % FOAM Apply to scalp daily   Secukinumab (COSENTYX SENSOREADY PEN) 150 MG/ML SOAJ Inject 2 pens subcutaneously every 4 weeks   testosterone cypionate (DEPOTESTOSTERONE CYPIONATE) 200 MG/ML injection Inject 0.5 mLs (100 mg total) into the muscle every 7 days.   ursodiol (ACTIGALL) 250 MG tablet Take 1 tablet by mouth at lunch and dinner in addition to ursodiol 500 mg for a total 750 mg dose.   ursodiol (ACTIGALL) 500 MG tablet Take 1 tablet (500 mg) by mouth at lunch and dinner in addition to ursodiol 250 mg for a total 750 mg dose.   ursodiol (ACTIGALL) 500 MG tablet Take 1 tablet (500 mg total) by mouth 2 (two) times a day.   Vitamin D (Ergocalciferol) (DRISDOL) 50,000 Units, Oral, Weekly   Xarelto 20 mg, Oral, (  Dosepack) Nightly - one time    Patient Active Problem List   Diagnosis Date Noted   Non-recurrent acute suppurative otitis media of both ears without spontaneous rupture of tympanic membranes 07/09/2022   Weight gain due to medication 07/09/2022   Pancreatic insufficiency 04/09/2022   Moderate single current episode of major depressive disorder (HCC) 03/27/2022   Posterior vitreous detachment of right eye 11/18/2021   Spondylosis of lumbar spine 10/03/2020   Pseudophakia of both eyes 05/07/2020   Retinal telangiectasis of both eyes 05/07/2020   Lattice degeneration of peripheral retina, left 09/08/2019    Lattice degeneration, right eye 09/08/2019   Chronic diastolic heart failure (HCC) 05/24/2019   Urinary dysfunction 04/12/2019   Osteoarthritis of right knee 08/23/2018   Constipation due to opioid therapy 03/30/2018   SNHL (sensorineural hearing loss) 12/04/2017   Abnormal urinary stream 12/03/2017   Osteoarthritis of carpometacarpal (CMC) joint of thumb 11/30/2017   Muscle weakness 11/17/2017   Transient vision disturbance 11/12/2017   Bilateral hand pain 10/30/2017   Pain of left hip joint 10/09/2017   Gynecomastia 07/10/2017   Iron deficiency anemia 07/05/2017   Spasticity 05/20/2017   Lumbar radiculitis 04/20/2017   Ulnar 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, left 11/28/2016   Idiopathic peripheral neuropathy 11/28/2016   Failed total knee arthroplasty, 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   Long term (current) use of anticoagulants 08/23/2015   Right upper quadrant abdominal pain 08/23/2015   Memory loss 05/10/2015   Gait abnormality 04/07/2015   Alkaline phosphatase elevation 04/07/2015   History of thrombosis 03/26/2015   Medial epicondylitis 02/07/2015   Cognitive decline 12/21/2014   Leukopenia 12/05/2014   Rotator cuff syndrome of right shoulder 10/27/2014   Status post left knee replacement 08/22/2014   Left lateral epicondylitis 08/22/2014   Chronic cerebral ischemia 08/18/2014   Arthrofibrosis of knee joint 08/17/2014   Cubital canal compression syndrome, left 08/17/2014   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   Chronic pain syndrome 04/10/2014   Insomnia 04/10/2014   Chronic non-specific white matter lesions on MRI 04/10/2014   CFS (chronic fatigue syndrome) 04/10/2014   Biceps tendonitis on left 03/01/2014   Polycythemia vera (HCC) 12/27/2013   H/O TIA (transient ischemic attack) and stroke 12/27/2013   Neck pain 12/27/2013   OSA (obstructive sleep apnea) 12/08/2013   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/31/2013   Depression with anxiety 08/04/2013   Protein C deficiency (HCC) 08/01/2013   Post  traumatic stress disorder (PTSD) 08/01/2013   Speech abnormality 07/25/2013   Obesity 05/10/2013   Lower extremity edema 05/10/2013   GERD (gastroesophageal reflux disease) 05/10/2013   Arthritis 05/10/2013   OA (osteoarthritis) of knee 03/15/2013   Headache 10/25/2012   Palpitations 10/18/2012   Fatty liver determined by biopsy 06/01/2012   Arthropathic psoriasis, unspecified (HCC) 04/29/2012   Abnormal liver enzymes 03/29/2012   Psoriatic arthritis (HCC) 12/29/2011   Left Renal Hydronephrosis 12/11/2010   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     Objective:     BP 122/82 (BP Location: Left Arm, Patient Position: Sitting, Cuff Size: Large)   Pulse 100   Temp 98.3 F (36.8 C) (Oral)   Ht 5' 7.5" (1.715 m)   Wt 212 lb 4.8 oz (96.3 kg)   SpO2 100%   BMI 32.76 kg/m    Physical Exam Vitals reviewed.  Constitutional:  Appearance: Normal appearance. He is well-groomed. He is obese.  HENT:     Right Ear: Tympanic membrane is scarred and erythematous.     Left Ear: Tympanic membrane is scarred and erythematous.  Pulmonary:     Effort: Pulmonary effort is normal.     Breath sounds: Normal air entry.  Neurological:     Mental Status: He is alert and oriented to person, place, and time. Mental status is at baseline.     Gait: Gait is intact.  Psychiatric:        Mood and Affect: Mood and affect normal.        Speech: Speech normal.        Behavior: Behavior normal.      No results found for any visits on 07/07/22.    The ASCVD Risk score (Arnett DK, et al., 2019) failed to calculate for the following reasons:   The patient has a prior MI or stroke diagnosis    Assessment & Plan:  Non-recurrent acute suppurative otitis media of both ears without spontaneous rupture of  tympanic membranes Assessment & Plan: Both ears appear to have purulence behind the membranes. Will treat with cefdinir 300 mg BID   Orders: -     Cefdinir; Take 1 capsule (300 mg total) by mouth 2 (two) times daily for 10 days.  Dispense: 20 capsule; Refill: 0  Weight gain due to medication Assessment & Plan: We had a long discussion about his medications, I think that is we start metformin 500 mg BID it will help with the weight gain from his medications. Pt is agreeable to trying this medication. I will see him back 7/29 for follow up. The dietary instructions I gave him are listed below.   Increase protein: at least 100 grams per day  Reduce carbs (sugar and starches) to less than 90 grams  Fiber: 35 grams per day  Orders: -     metFORMIN HCl; Take 1 tablet (500 mg total) by mouth 2 (two) times daily with a meal.  Dispense: 180 tablet; Refill: 1     No follow-ups on file.    Karie Georges, MD

## 2022-07-07 NOTE — Patient Instructions (Addendum)
Increase protein: at least 100 grams per day  Reduce carbs (sugar and starches) to less than 90 grams  Fiber: 35 grams per day

## 2022-07-08 ENCOUNTER — Telehealth: Payer: Self-pay | Admitting: Registered Nurse

## 2022-07-08 ENCOUNTER — Other Ambulatory Visit: Payer: Self-pay | Admitting: Physical Medicine & Rehabilitation

## 2022-07-08 ENCOUNTER — Other Ambulatory Visit (HOSPITAL_BASED_OUTPATIENT_CLINIC_OR_DEPARTMENT_OTHER): Payer: Self-pay

## 2022-07-08 ENCOUNTER — Other Ambulatory Visit: Payer: Self-pay

## 2022-07-08 ENCOUNTER — Encounter: Payer: Self-pay | Admitting: Physical Medicine & Rehabilitation

## 2022-07-08 DIAGNOSIS — L408 Other psoriasis: Secondary | ICD-10-CM | POA: Diagnosis not present

## 2022-07-08 DIAGNOSIS — M47816 Spondylosis without myelopathy or radiculopathy, lumbar region: Secondary | ICD-10-CM

## 2022-07-08 DIAGNOSIS — L4050 Arthropathic psoriasis, unspecified: Secondary | ICD-10-CM

## 2022-07-08 DIAGNOSIS — L299 Pruritus, unspecified: Secondary | ICD-10-CM | POA: Diagnosis not present

## 2022-07-08 DIAGNOSIS — L218 Other seborrheic dermatitis: Secondary | ICD-10-CM | POA: Diagnosis not present

## 2022-07-08 MED ORDER — OXYCODONE HCL 10 MG PO TABS
10.0000 mg | ORAL_TABLET | Freq: Four times a day (QID) | ORAL | 0 refills | Status: DC | PRN
Start: 2022-07-08 — End: 2022-08-31
  Filled 2022-07-08: qty 80, 20d supply, fill #0

## 2022-07-08 MED ORDER — DESONIDE 0.05 % EX CREA
TOPICAL_CREAM | CUTANEOUS | 2 refills | Status: DC
  Filled 2022-07-08 – 2022-12-02 (×2): qty 60, 30d supply, fill #0
  Filled 2023-01-27: qty 60, 30d supply, fill #1
  Filled 2023-02-17 – 2023-02-25 (×2): qty 60, 30d supply, fill #2

## 2022-07-08 MED ORDER — CLOBETASOL PROPIONATE EMULSION 0.05 % EX FOAM
CUTANEOUS | 5 refills | Status: DC
  Filled 2022-07-08 – 2022-07-18 (×3): qty 100, 30d supply, fill #0

## 2022-07-08 MED ORDER — ZORYVE 0.3 % EX FOAM
CUTANEOUS | 3 refills | Status: DC
  Filled 2022-07-08: qty 60, 30d supply, fill #0

## 2022-07-08 NOTE — Telephone Encounter (Signed)
PMP was Reviewed.  [facility] was called  Oxycodone e-scribed today. Jesse Bowers is aware of the above via My-Chart

## 2022-07-09 ENCOUNTER — Other Ambulatory Visit (HOSPITAL_BASED_OUTPATIENT_CLINIC_OR_DEPARTMENT_OTHER): Payer: Self-pay

## 2022-07-09 ENCOUNTER — Encounter (HOSPITAL_BASED_OUTPATIENT_CLINIC_OR_DEPARTMENT_OTHER): Payer: Self-pay

## 2022-07-09 DIAGNOSIS — H66003 Acute suppurative otitis media without spontaneous rupture of ear drum, bilateral: Secondary | ICD-10-CM | POA: Insufficient documentation

## 2022-07-09 DIAGNOSIS — F411 Generalized anxiety disorder: Secondary | ICD-10-CM | POA: Diagnosis not present

## 2022-07-09 DIAGNOSIS — T50905A Adverse effect of unspecified drugs, medicaments and biological substances, initial encounter: Secondary | ICD-10-CM | POA: Insufficient documentation

## 2022-07-09 DIAGNOSIS — F331 Major depressive disorder, recurrent, moderate: Secondary | ICD-10-CM | POA: Diagnosis not present

## 2022-07-09 NOTE — Assessment & Plan Note (Signed)
We had a long discussion about his medications, I think that is we start metformin 500 mg BID it will help with the weight gain from his medications. Pt is agreeable to trying this medication. I will see him back 7/29 for follow up. The dietary instructions I gave him are listed below.   Increase protein: at least 100 grams per day  Reduce carbs (sugar and starches) to less than 90 grams  Fiber: 35 grams per day

## 2022-07-09 NOTE — Assessment & Plan Note (Signed)
Both ears appear to have purulence behind the membranes. Will treat with cefdinir 300 mg BID

## 2022-07-10 ENCOUNTER — Ambulatory Visit: Payer: Commercial Managed Care - PPO | Admitting: Family Medicine

## 2022-07-10 ENCOUNTER — Other Ambulatory Visit (HOSPITAL_BASED_OUTPATIENT_CLINIC_OR_DEPARTMENT_OTHER): Payer: Self-pay

## 2022-07-11 ENCOUNTER — Encounter: Payer: Self-pay | Admitting: Physical Medicine & Rehabilitation

## 2022-07-14 ENCOUNTER — Encounter: Payer: Self-pay | Admitting: Family Medicine

## 2022-07-14 DIAGNOSIS — Z09 Encounter for follow-up examination after completed treatment for conditions other than malignant neoplasm: Secondary | ICD-10-CM | POA: Diagnosis not present

## 2022-07-14 DIAGNOSIS — F649 Gender identity disorder, unspecified: Secondary | ICD-10-CM | POA: Diagnosis not present

## 2022-07-14 NOTE — Telephone Encounter (Signed)
Do I have any openings this week to check his ears?

## 2022-07-15 ENCOUNTER — Ambulatory Visit (INDEPENDENT_AMBULATORY_CARE_PROVIDER_SITE_OTHER): Payer: Commercial Managed Care - PPO | Admitting: Family Medicine

## 2022-07-15 ENCOUNTER — Encounter: Payer: Self-pay | Admitting: Family Medicine

## 2022-07-15 ENCOUNTER — Other Ambulatory Visit (HOSPITAL_BASED_OUTPATIENT_CLINIC_OR_DEPARTMENT_OTHER): Payer: Self-pay

## 2022-07-15 VITALS — BP 122/68 | HR 95 | Temp 98.7°F | Ht 67.5 in | Wt 212.1 lb

## 2022-07-15 DIAGNOSIS — H66003 Acute suppurative otitis media without spontaneous rupture of ear drum, bilateral: Secondary | ICD-10-CM

## 2022-07-15 MED ORDER — CEFDINIR 300 MG PO CAPS
300.0000 mg | ORAL_CAPSULE | Freq: Two times a day (BID) | ORAL | 0 refills | Status: DC
Start: 2022-07-15 — End: 2022-07-21
  Filled 2022-07-15: qty 14, 7d supply, fill #0

## 2022-07-15 NOTE — Progress Notes (Signed)
   Acute Office Visit  Subjective:     Patient ID: Jesse Bowers, adult    DOB: Apr 06, 1965, 57 y.o.   MRN: [id number]  Chief Complaint  Patient presents with   Ear Pain    Patient complains of recurrent bilateral ear pain    HPI Patient is in today for follow up on BL ear infections, reports his right ear is still painful but maybe it is getting slightly better, is almost finished with the 10 days of cefdinir-- he was worried that it hasn't resolved completely yet.   ROS      Objective:    BP 122/68 (BP Location: Left Arm, Patient Position: Sitting, Cuff Size: Large)   Pulse 95   Temp 98.7 F (37.1 C) (Oral)   Ht 5' 7.5" (1.715 m)   Wt 212 lb 1.6 oz (96.2 kg)   SpO2 99%   BMI 32.73 kg/m    Physical Exam HENT:     Right Ear: Drainage present. Tympanic membrane is erythematous.     Ears:     Comments: Continued yellow purulence behind both membranes-- right does look somewhat improved.    No results found for any visits on 07/15/22.      Assessment & Plan:   Problem List Items Addressed This Visit       Unprioritized   Non-recurrent acute suppurative otitis media of both ears without spontaneous rupture of tympanic membranes - Primary   Relevant Medications   cefdinir (OMNICEF) 300 MG capsule   Since pt is slightly better on day 8 of antibiotics, I will give another 7 days of cefdinir and I recommended that he see the ENT specialist if his sx do not improve. He is currently off his immunomodulators due to having an open surgical wound. Will see him back at his regular appt in July.  Meds ordered this encounter  Medications   cefdinir (OMNICEF) 300 MG capsule    Sig: Take 1 capsule (300 mg total) by mouth 2 (two) times daily.    Dispense:  14 capsule    Refill:  0    Please add 7 days to the 10 day course he picked up previously    No follow-ups on file.  Karie Georges, MD

## 2022-07-16 ENCOUNTER — Other Ambulatory Visit (HOSPITAL_BASED_OUTPATIENT_CLINIC_OR_DEPARTMENT_OTHER): Payer: Self-pay

## 2022-07-16 ENCOUNTER — Encounter (HOSPITAL_BASED_OUTPATIENT_CLINIC_OR_DEPARTMENT_OTHER): Payer: Commercial Managed Care - PPO | Admitting: Physical Therapy

## 2022-07-16 DIAGNOSIS — L408 Other psoriasis: Secondary | ICD-10-CM | POA: Diagnosis not present

## 2022-07-16 DIAGNOSIS — F411 Generalized anxiety disorder: Secondary | ICD-10-CM | POA: Diagnosis not present

## 2022-07-16 DIAGNOSIS — F331 Major depressive disorder, recurrent, moderate: Secondary | ICD-10-CM | POA: Diagnosis not present

## 2022-07-16 DIAGNOSIS — Z79899 Other long term (current) drug therapy: Secondary | ICD-10-CM | POA: Diagnosis not present

## 2022-07-18 ENCOUNTER — Other Ambulatory Visit (HOSPITAL_BASED_OUTPATIENT_CLINIC_OR_DEPARTMENT_OTHER): Payer: Self-pay

## 2022-07-18 ENCOUNTER — Encounter (HOSPITAL_BASED_OUTPATIENT_CLINIC_OR_DEPARTMENT_OTHER): Payer: Self-pay | Admitting: Pharmacist

## 2022-07-18 DIAGNOSIS — M25562 Pain in left knee: Secondary | ICD-10-CM | POA: Diagnosis not present

## 2022-07-20 ENCOUNTER — Other Ambulatory Visit (HOSPITAL_BASED_OUTPATIENT_CLINIC_OR_DEPARTMENT_OTHER): Payer: Self-pay

## 2022-07-21 ENCOUNTER — Encounter: Payer: Self-pay | Admitting: Family Medicine

## 2022-07-21 ENCOUNTER — Other Ambulatory Visit (HOSPITAL_BASED_OUTPATIENT_CLINIC_OR_DEPARTMENT_OTHER): Payer: Self-pay

## 2022-07-21 ENCOUNTER — Ambulatory Visit (INDEPENDENT_AMBULATORY_CARE_PROVIDER_SITE_OTHER): Payer: Commercial Managed Care - PPO | Admitting: Family Medicine

## 2022-07-21 VITALS — BP 122/80 | HR 102 | Temp 98.6°F | Ht 67.5 in | Wt 208.3 lb

## 2022-07-21 DIAGNOSIS — L03316 Cellulitis of umbilicus: Secondary | ICD-10-CM | POA: Diagnosis not present

## 2022-07-21 MED ORDER — DOXYCYCLINE HYCLATE 100 MG PO TABS
100.0000 mg | ORAL_TABLET | Freq: Two times a day (BID) | ORAL | 0 refills | Status: DC
Start: 2022-07-21 — End: 2022-08-06
  Filled 2022-07-21: qty 14, 7d supply, fill #0

## 2022-07-21 NOTE — Patient Instructions (Addendum)
Stop cefdinir, start doxycycline 100 mg BID for 7 days, use probiotics threes times a day with meals  Loperamide 1 tablet after loose stool, up to 16 mg in 24 hours (8 tablets in 24)

## 2022-07-21 NOTE — Progress Notes (Signed)
Acute Office Visit  Subjective:     Patient ID: Jesse Bowers, adult    DOB: 1965-11-27, 57 y.o.   MRN: [id number]  Chief Complaint  Patient presents with   Abdominal Pain    Patient complains of abdominal pain around navel, x3 days, Patient reports redness and knot above navel   Nausea    Patient complains of nausea, x3 days,    Diarrhea    Patient complains of diarrhea, x3 days    Abdominal Pain Associated symptoms include diarrhea.  Diarrhea  Associated symptoms include abdominal pain.   Patient is in today for a redness and pain around his navel, states that he still on the cefdinir for his ear infection. Is reporting some nausea and diarrhea for the last 3 days. States he went several times yesterday and about 4 times today. States that his ears still feel like he is "underwater" but there is no pain now. He reports subjective chills and some sweats but no fever.   Review of Systems  Gastrointestinal:  Positive for abdominal pain and diarrhea.        Objective:    BP 122/80 (BP Location: Left Arm, Patient Position: Sitting, Cuff Size: Normal)   Pulse (!) 102   Temp 98.6 F (37 C) (Oral)   Ht 5' 7.5" (1.715 m)   Wt 208 lb 4.8 oz (94.5 kg)   SpO2 98%   BMI 32.14 kg/m    Physical Exam Constitutional:      Appearance: Normal appearance. He is normal weight.  HENT:     Right Ear: Drainage present. A middle ear effusion is present. Tympanic membrane is erythematous.     Left Ear: Tympanic membrane is bulging.  Cardiovascular:     Rate and Rhythm: Regular rhythm. Tachycardia present.  Pulmonary:     Effort: Pulmonary effort is normal.  Abdominal:     General: Bowel sounds are normal.     Tenderness: There is abdominal tenderness (umbilical, there is a ring of redness around the umbilicus, no red streaking, no induration or fluctuance). There is guarding. There is no rebound.  Neurological:     Mental Status: He is alert.     No results found for any  visits on 07/21/22.      Assessment & Plan:   Problem List Items Addressed This Visit   None Visit Diagnoses     Cellulitis of umbilicus    -  Primary   Relevant Medications   doxycycline (VIBRA-TABS) 100 MG tablet     Redness on the skin appears to be very early cellulitis, pt was on cefdinir and is developing antibiotic associated diarrhea. I will stop the cefdinir and switch to doxycycline to get MRSA coverage. I advised he use fiber, probiotics three times a day and also loperamide 1 tablet after each loose stool, not to exceed 8 tablets in 24 hours. If his diarrhea does not improve I advised him to call his GI physician for recommendations. We discussed the risks/benefits of continued antibiotic therapy -- there is concern about skin infection-- he does continue to have the open surgical wound in the groin-- I am wondering it this may be the source? Pt will call me in 1 week to let me know if the cellulitis has improved. If not then I would urge him to return to the surgeon.  I also encouraged him to see the ENT physician for his ears as I do not think that the antibiotics are improving his  ear condition. Meds ordered this encounter  Medications   doxycycline (VIBRA-TABS) 100 MG tablet    Sig: Take 1 tablet (100 mg total) by mouth 2 (two) times daily.    Dispense:  14 tablet    Refill:  0    No follow-ups on file.  Karie Georges, MD

## 2022-07-22 DIAGNOSIS — M13841 Other specified arthritis, right hand: Secondary | ICD-10-CM | POA: Diagnosis not present

## 2022-07-23 ENCOUNTER — Encounter: Payer: Self-pay | Admitting: Family Medicine

## 2022-07-23 ENCOUNTER — Other Ambulatory Visit (HOSPITAL_BASED_OUTPATIENT_CLINIC_OR_DEPARTMENT_OTHER): Payer: Self-pay

## 2022-07-23 DIAGNOSIS — F331 Major depressive disorder, recurrent, moderate: Secondary | ICD-10-CM | POA: Diagnosis not present

## 2022-07-23 DIAGNOSIS — F411 Generalized anxiety disorder: Secondary | ICD-10-CM | POA: Diagnosis not present

## 2022-07-24 ENCOUNTER — Other Ambulatory Visit (HOSPITAL_BASED_OUTPATIENT_CLINIC_OR_DEPARTMENT_OTHER): Payer: Self-pay

## 2022-07-24 MED ORDER — METHOTREXATE SODIUM 2.5 MG PO TABS
ORAL_TABLET | ORAL | 0 refills | Status: DC
  Filled 2022-07-24: qty 72, 84d supply, fill #0

## 2022-07-25 ENCOUNTER — Other Ambulatory Visit (HOSPITAL_COMMUNITY): Payer: Self-pay

## 2022-07-29 ENCOUNTER — Other Ambulatory Visit (HOSPITAL_BASED_OUTPATIENT_CLINIC_OR_DEPARTMENT_OTHER): Payer: Self-pay

## 2022-07-29 MED ORDER — VITAMIN D (ERGOCALCIFEROL) 1.25 MG (50000 UNIT) PO CAPS
50000.0000 [IU] | ORAL_CAPSULE | ORAL | 0 refills | Status: DC
  Filled 2022-07-29: qty 12, 84d supply, fill #0

## 2022-07-30 ENCOUNTER — Encounter: Payer: Self-pay | Admitting: Family Medicine

## 2022-07-30 DIAGNOSIS — F411 Generalized anxiety disorder: Secondary | ICD-10-CM | POA: Diagnosis not present

## 2022-07-30 DIAGNOSIS — F331 Major depressive disorder, recurrent, moderate: Secondary | ICD-10-CM | POA: Diagnosis not present

## 2022-07-31 ENCOUNTER — Other Ambulatory Visit: Payer: Self-pay

## 2022-08-01 ENCOUNTER — Encounter: Payer: Self-pay | Admitting: Family Medicine

## 2022-08-01 ENCOUNTER — Other Ambulatory Visit (HOSPITAL_BASED_OUTPATIENT_CLINIC_OR_DEPARTMENT_OTHER): Payer: Self-pay

## 2022-08-01 MED ORDER — URSODIOL 500 MG PO TABS
500.0000 mg | ORAL_TABLET | Freq: Two times a day (BID) | ORAL | 0 refills | Status: DC
  Filled 2022-08-01 – 2022-10-30 (×2): qty 180, 90d supply, fill #0

## 2022-08-03 ENCOUNTER — Other Ambulatory Visit: Payer: Self-pay | Admitting: Registered Nurse

## 2022-08-03 DIAGNOSIS — M47816 Spondylosis without myelopathy or radiculopathy, lumbar region: Secondary | ICD-10-CM

## 2022-08-04 ENCOUNTER — Other Ambulatory Visit (HOSPITAL_BASED_OUTPATIENT_CLINIC_OR_DEPARTMENT_OTHER): Payer: Self-pay

## 2022-08-04 ENCOUNTER — Telehealth: Payer: Self-pay | Admitting: Registered Nurse

## 2022-08-04 DIAGNOSIS — M47816 Spondylosis without myelopathy or radiculopathy, lumbar region: Secondary | ICD-10-CM

## 2022-08-04 DIAGNOSIS — F649 Gender identity disorder, unspecified: Secondary | ICD-10-CM | POA: Diagnosis not present

## 2022-08-04 DIAGNOSIS — Z09 Encounter for follow-up examination after completed treatment for conditions other than malignant neoplasm: Secondary | ICD-10-CM | POA: Diagnosis not present

## 2022-08-04 MED ORDER — MORPHINE SULFATE ER 30 MG PO TBCR
30.0000 mg | EXTENDED_RELEASE_TABLET | Freq: Two times a day (BID) | ORAL | 0 refills | Status: DC
Start: 2022-08-04 — End: 2022-08-31
  Filled 2022-08-04: qty 60, 30d supply, fill #0

## 2022-08-04 NOTE — Telephone Encounter (Signed)
PMP was Reviewed.  Morphine e-scribed today.  Othie is aware via My-Chart message. He has a scheduled appointment with Dr. Riley Kill on 08/06/2022.

## 2022-08-05 ENCOUNTER — Other Ambulatory Visit (HOSPITAL_BASED_OUTPATIENT_CLINIC_OR_DEPARTMENT_OTHER): Payer: Self-pay

## 2022-08-05 DIAGNOSIS — K900 Celiac disease: Secondary | ICD-10-CM | POA: Diagnosis not present

## 2022-08-05 MED ORDER — CREON 24000-76000 UNITS PO CPEP
ORAL_CAPSULE | ORAL | 5 refills | Status: DC
  Filled 2022-08-05: qty 180, 30d supply, fill #0
  Filled 2022-08-11: qty 180, fill #0
  Filled 2022-10-01: qty 540, 90d supply, fill #0
  Filled 2023-01-04: qty 500, 83d supply, fill #1
  Filled 2023-05-01: qty 500, 83d supply, fill #2

## 2022-08-06 ENCOUNTER — Encounter: Payer: Self-pay | Admitting: Physical Medicine & Rehabilitation

## 2022-08-06 ENCOUNTER — Encounter
Payer: Commercial Managed Care - PPO | Attending: Physical Medicine & Rehabilitation | Admitting: Physical Medicine & Rehabilitation

## 2022-08-06 ENCOUNTER — Ambulatory Visit: Payer: Commercial Managed Care - PPO | Admitting: Family Medicine

## 2022-08-06 VITALS — BP 121/86 | Ht 67.5 in | Wt 209.4 lb

## 2022-08-06 DIAGNOSIS — D751 Secondary polycythemia: Secondary | ICD-10-CM | POA: Diagnosis not present

## 2022-08-06 DIAGNOSIS — G609 Hereditary and idiopathic neuropathy, unspecified: Secondary | ICD-10-CM | POA: Insufficient documentation

## 2022-08-06 DIAGNOSIS — M7630 Iliotibial band syndrome, unspecified leg: Secondary | ICD-10-CM | POA: Insufficient documentation

## 2022-08-06 DIAGNOSIS — K900 Celiac disease: Secondary | ICD-10-CM | POA: Diagnosis not present

## 2022-08-06 DIAGNOSIS — M7631 Iliotibial band syndrome, right leg: Secondary | ICD-10-CM | POA: Insufficient documentation

## 2022-08-06 DIAGNOSIS — M519 Unspecified thoracic, thoracolumbar and lumbosacral intervertebral disc disorder: Secondary | ICD-10-CM | POA: Diagnosis not present

## 2022-08-06 NOTE — Progress Notes (Signed)
Subjective:    Patient ID: Jesse Bowers, male   DOB: 06/04/1965, 57 y.o.   MRN: [id number]  HPI  Jesse Bowers back regarding his chronic pain syndrome. He had further gender reconstruction surgery in April due to a complication. Pain is starting to improve over the last few weeks. He hopes this is the last surgery.   He complains of fatigue mos recently which he attributes to being off his cosentyx.[name]   His low back and wrist remain his biggest issues. Dr. Orlan Leavens has recommended consvt mgt of his wrist at this point. Dr. Wynn Banker last injected his back in 2022. His back bothers him with prolonged sitting, walking or bending. He is not having consistent radiation of pain in his legs. He does report pain in his bilateral hips which he refers to as bursitis.   For pain he remains on MS Contin 30mg   q12 hours along with oxycodone 10mg  q6 prn.   Pain Inventory Average Pain 7 Pain Right Now 4  LOCATION OF PAIN  neck r wrist fingers back knee ankle L and toes all 10  BOWEL Number of stools per week: 2-3/day Oral laxative use Yes  Type of laxative Ibsrela bid  BLADDER Normal  Mobility walk without assistance walk with assistance use a cane use a walker ability to climb steps?  yes do you drive?  yes  Function disabled: date disabled 2014 I need assistance with the following:  meal prep, household duties, and shopping  Neuro/Psych weakness numbness tremor tingling trouble walking spasms dizziness depression anxiety loss of taste or smell  Prior Studies Any changes since last visit?  no  Physicians involved in your care Any changes since last visit?  no   Family History  Problem Relation Age of Onset   Stroke Maternal Grandfather        47   Heart attack Maternal Grandfather    Glaucoma Maternal Grandfather    Macular degeneration Maternal Grandfather    Breast cancer Sister    Hypertension Mother    Psoriasis Mother    Other Mother        meningioma  developed ~2019   Glaucoma Mother    Cancer Paternal Grandfather    Heart attack Paternal Grandfather    Stroke Paternal Uncle        age 63   Polycythemia Paternal Uncle    Stroke Maternal Grandmother    Congestive Heart Failure Maternal Grandmother    Heart attack Maternal Grandmother    Protein C deficiency Sister 64       Miscarriages   Breast cancer Maternal Aunt 35   Social History   Socioeconomic History   Marital status: Married    Spouse name: Not on file   Number of children: 2   Years of education: 4y college   Highest education level: Master's degree (e.g., MA, MS, MEng, MEd, MSW, MBA)  Occupational History   Occupation: Restaurant manager, fast food    Comment: Not working since CVA 2015  Tobacco Use   Smoking status: Never   Smokeless tobacco: Never  Vaping Use   Vaping Use: Never used  Substance and Sexual Activity   Alcohol use: Yes    Comment: social   Drug use: No   Sexual activity: Yes    Birth control/protection: None    Comment: patient is a transgender on testosterone shots, no biological kids  Other Topics Concern   Not on file  Social History Narrative   Not on file  Social Determinants of Health   Financial Resource Strain: Low Risk  (06/08/2022)   Overall Financial Resource Strain (CARDIA)    Difficulty of Paying Living Expenses: Not hard at all  Food Insecurity: No Food Insecurity (06/08/2022)   Hunger Vital Sign    Worried About Running Out of Food in the Last Year: Never true    Ran Out of Food in the Last Year: Never true  Transportation Needs: Unmet Transportation Needs (06/08/2022)   PRAPARE - Transportation    Lack of Transportation (Medical): Yes    Lack of Transportation (Non-Medical): Yes  Physical Activity: Unknown (06/08/2022)   Exercise Vital Sign    Days of Exercise per Week: 0 days    Minutes of Exercise per Session: Not on file  Stress: Stress Concern Present (06/08/2022)   [employer] of Occupational Health -  Occupational Stress Questionnaire    Feeling of Stress : Rather much  Social Connections: Socially Isolated (06/08/2022)   Social Connection and Isolation Panel [NHANES]    Frequency of Communication with Friends and Family: Once a week    Frequency of Social Gatherings with Friends and Family: Never    Attends Religious Services: Never    Database administrator or Organizations: No    Attends Engineer, structural: Not on file    Marital Status: Married   Past Surgical History:  Procedure Laterality Date   ABDOMINAL HYSTERECTOMY Bilateral 1994   TAH, BSO- tranverse incision at 57 yo   ANKLE ARTHROSCOPY WITH RECONSTRUCTION Right 2007   CHOLECYSTECTOMY     laparoscopic   COLONOSCOPY     x3   EYE SURGERY     Left eye 03/02/2018, right 02/15/2018   HEMORROIDECTOMY  08/19/2020   HIP ARTHROSCOPY W/ LABRAL REPAIR Right 05/11/2013   acetabular labral tear 03/30/2013   KNEE ARTHROPLASTY Right    KNEE JOINT MANIPULATION Left    x3 under anesthesia   KNEE SURGERY Bilateral 1984   Right ACL, left PCL repair   LITHOTRIPSY  2005   LIVER BIOPSY  2013   normal results.   MASTECTOMY Bilateral    prior to 2009   MOUTH SURGERY     NASAL SEPTUM SURGERY N/A 09/20/2015   by ENT Dr. Ezzard Standing   OVARIAN CYST SURGERY Left    size of grapefruit, was informed that she had shortened vagina   SHOULDER SURGERY Bilateral    Right 08/15/2016, Left 11/15/2016   THUMB ARTHROSCOPY Left    THYROIDECTOMY, PARTIAL Left 2008   TOTAL KNEE ARTHROPLASTY Right 08/23/2018   Procedure: TOTAL KNEE ARTHROPLASTY;  Surgeon: Ollen Gross, MD;  Location: [facility];  Service: Orthopedics;  Laterality: Right;    TOTAL KNEE REVISION Left 02/06/2016   Procedure: LEFT TOTAL KNEE REVISION;  Surgeon: Ollen Gross, MD;  Location: [facility];  Service: Orthopedics;  Laterality: Left;   TOTAL KNEE REVISION Left 04/22/2017   Procedure: Left knee polyethylene revision;  Surgeon: Ollen Gross, MD;  Location: [facility];  Service:  Orthopedics;  Laterality: Left;   UPPER GI ENDOSCOPY  2003   Past Medical History:  Diagnosis Date   Abnormal weight loss    Anxiety    Arthritis    Cataract    OU   Celiac disease    Cervical neck pain with evidence of disc disease    patient has a cyst    Chronic constipation    Chronic diastolic heart failure (HCC)    Pt. 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   Family history of adverse reaction to anesthesia    family has problems with anesthesia of nausea and vomiting    GERD (gastroesophageal reflux disease)    History of in 20's   Gluten enteropathy    H/O parotitis    right    Hard of hearing    History of kidney stones    History of retinal tear    Bilateral   History of staph infection    required wound vac   Hx-TIA (transient ischemic attack)    2015   Kidney stones 08/2020   LVH (left ventricular hypertrophy) 12/15/2016   Mild, noted on ECHO   MVP (mitral valve prolapse)    NAFL (nonalcoholic fatty liver)    Neck pain    Neuromuscular disorder (HCC)    bilateral neuropathy feet.   Nuclear sclerotic cataract of both eyes 09/08/2019   Pneumonia 12/17/2010   Polycythemia    Polycythemia, secondary    PONV (postoperative nausea and vomiting)    Protein C deficiency (HCC)    Dr. Thomes Cake   Psoriasis    16 X10 cm psoriatic rash on sole of left foot ; open and occ scant bleeding;    psoriatic arthritis    PTSD (post-traumatic stress disorder)    Scaphoid fracture of wrist 09/23/2013   Seizure (HCC)    childhood, medication until age 73 then weanned completely off   Sleep apnea    split night study last done by Dr. Epimenio Foot 06/18/15 shows severe OSA, CSA, and hypersomnia, rec bipap   Splenomegaly    Stenosis of ureteropelvic junction  (UPJ)    left   Stroke [facility])    CVA vs TIA in left cerebrum causing slight right sided weakness-Dr. 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    Wears glasses    Wears hearing aid    BP 121/86   Ht 5' 7.5" (1.715 m)   Wt 209 lb 6.4 oz (95 kg)   BMI 32.31 kg/m   Opioid Risk Score:   Fall Risk Score:  `1  Depression screen PHQ 2/9     08/06/2022    3:14 PM 07/07/2022    1:32 PM 06/12/2022    2:24 PM 06/06/2022    1:02 PM 04/09/2022    9:32 AM 03/27/2022    8:30 AM 02/05/2022    1:07 PM  Depression screen PHQ 2/9  Decreased Interest 0 1 0 0 0 0 0  Down, Depressed, Hopeless 0 1 0 0 0 0 0  PHQ - 2 Score 0 2 0 0 0 0 0  Altered sleeping  1 2  3 2    Tired, decreased energy   0  3 1   Change in appetite  0 0  0 0   Feeling bad or failure about yourself   0 0  0 0   Trouble concentrating  2 2  0 1   Moving slowly or fidgety/restless  0 0  0 0   Suicidal thoughts  0 0  0 0   PHQ-9 Score  5 4  6 4    Difficult doing work/chores  Somewhat difficult   Somewhat difficult Somewhat difficult      Review of Systems  Constitutional:  Positive for chills, fever and unexpected weight change.       Wt gain  HENT: Negative.    Eyes: Negative.   Respiratory:  Positive for shortness of breath.   Cardiovascular:  Positive for leg swelling.  Gastrointestinal:  Positive for abdominal pain and nausea.  Endocrine: Negative.   Genitourinary: Negative.   Musculoskeletal:  Positive for arthralgias, back pain, myalgias and neck pain.       Spasms  Skin: Negative.   Neurological:  Positive for dizziness, weakness and numbness.       Tingling  Hematological: Negative.   Psychiatric/Behavioral:  Positive for dysphoric mood. The patient is nervous/anxious.   All other systems reviewed and are negative.      Objective:   Physical Exam  General: No acute distress HEENT: NCAT, EOMI, oral membranes moist Cards: reg rate  Chest: normal effort Abdomen: Soft, NT, ND Skin: dry, intact Extremities: no edema Psych: pleasant and appropriate  Skin: intact Neuro: Alert and oriented x 3. Normal insight and awareness. Intact Memory. Normal language and speech. Cranial nerve exam unremarkable. LE motor 5/5 LLE and 4-5/ RLe with more weakness in ADF than anywhere else. He has stocking glove sensory loss in both feet below ankles as well as sensory loss along left lateral leg. Right 2-4 toes more affected. Tends to land farther ahead with left foot than right.  valgus at left knee, steppage pattern with right knee still somewhat present.    no abnormal resting tone Musculoskeletal:  pain along superior aspect of TFL bilaterally. Greater troch areas were non-tender. Low back with discomfort in bending.   Mild hammertoe deformity along the second right toe.  Low back with flattening of lordotic curve. Tighter with rotation to the right han left. Pain along beltline central and to the right. Flexion and extension sl limited as well.            Assessment & Plan:  1. Psoriatic arthritis with pain in multiple areas, most prominently feet, hands, elbows. Pain also related to his prior CVA and associated motor/sensory change   2. 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 3. Hx of left total knee revision again on 04/21/17 per [facility] and right TKA 08/2018 thru [facility].  4. Chronic  low back pain---MRI with severe DDD at L5-S1. Left S1 radiculopathy? only mild foraminal stenosis however -s/p ESI 12/21/20  -decreased ROM in lumbar spine -will refer back for L5-S1 ESI by Dr. Wynn Banker. He had at least 6 mos of relief with the last injection. He has underwent therapy and  conservative measures to address pain -gave pt extensive stretches for his TFL.I think part of this pain also may be referred from his lumbar spine  5. Protein C deficiency   6. Left shoulder subluxation, bicipital tendonitis 7. Central sleep apnea 8. Chronic opioid use for pain  -continue ms contin 30mg  q12 hours  -continue oxycodone 10mg  q6 prn  -would like to wean oxycodone as heals surgically and after potential ESI  We will continue the controlled substance monitoring program, this consists of regular clinic  visits, examinations, routine drug screening, pill counts as well as use of [place] Controlled Substance Reporting System. [place] today.   9. Depression with anxiety.  0. Left lateral epidondylitis  11. 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  has had right and left sided weakness with sensory loss in C4-6 dermatomes.. Most recent cervical MRI without significant change 12. 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 OINC 14. Right wrist psoriatic arthritis, tendonapthy, ganglion cysts s/p multiple recent surgeries.  s/p right thumb replacement  -is followed by Dr. Orlan Leavens at [facility]. Recent gender reconstruction surgery  Thirty minutes of face to face patient care time were spent during this visit. All questions were encouraged and answered. Follow up with me in about 2 months. Will request 1 month visit with Dr. Wynn Banker.

## 2022-08-06 NOTE — Patient Instructions (Signed)
ALWAYS FEEL FREE TO CALL OUR OFFICE WITH ANY PROBLEMS OR QUESTIONS ([phone])  **PLEASE NOTE** ALL MEDICATION REFILL REQUESTS (INCLUDING CONTROLLED SUBSTANCES) NEED TO BE MADE AT LEAST 7 DAYS PRIOR TO REFILL BEING DUE. ANY REFILL REQUESTS INSIDE THAT TIME FRAME MAY RESULT IN DELAYS IN RECEIVING YOUR PRESCRIPTION.                    

## 2022-08-07 DIAGNOSIS — F411 Generalized anxiety disorder: Secondary | ICD-10-CM | POA: Diagnosis not present

## 2022-08-07 DIAGNOSIS — F331 Major depressive disorder, recurrent, moderate: Secondary | ICD-10-CM | POA: Diagnosis not present

## 2022-08-08 ENCOUNTER — Other Ambulatory Visit (HOSPITAL_BASED_OUTPATIENT_CLINIC_OR_DEPARTMENT_OTHER): Payer: Self-pay

## 2022-08-08 DIAGNOSIS — H903 Sensorineural hearing loss, bilateral: Secondary | ICD-10-CM | POA: Diagnosis not present

## 2022-08-08 DIAGNOSIS — H608X3 Other otitis externa, bilateral: Secondary | ICD-10-CM | POA: Diagnosis not present

## 2022-08-08 DIAGNOSIS — J3489 Other specified disorders of nose and nasal sinuses: Secondary | ICD-10-CM | POA: Diagnosis not present

## 2022-08-08 MED ORDER — MUPIROCIN 2 % EX OINT
TOPICAL_OINTMENT | CUTANEOUS | 0 refills | Status: DC
  Filled 2022-08-08: qty 22, 14d supply, fill #0

## 2022-08-08 MED ORDER — CIPROFLOXACIN-DEXAMETHASONE 0.3-0.1 % OT SUSP
OTIC | 1 refills | Status: DC
  Filled 2022-08-08: qty 7.5, 7d supply, fill #0
  Filled 2022-08-11 – 2022-08-19 (×2): qty 7.5, 7d supply, fill #1

## 2022-08-11 ENCOUNTER — Other Ambulatory Visit: Payer: Self-pay

## 2022-08-11 ENCOUNTER — Other Ambulatory Visit (HOSPITAL_BASED_OUTPATIENT_CLINIC_OR_DEPARTMENT_OTHER): Payer: Self-pay

## 2022-08-11 ENCOUNTER — Encounter: Payer: Self-pay | Admitting: Physical Medicine & Rehabilitation

## 2022-08-11 DIAGNOSIS — E041 Nontoxic single thyroid nodule: Secondary | ICD-10-CM | POA: Diagnosis not present

## 2022-08-11 DIAGNOSIS — Z9229 Personal history of other drug therapy: Secondary | ICD-10-CM | POA: Diagnosis not present

## 2022-08-11 DIAGNOSIS — M8580 Other specified disorders of bone density and structure, unspecified site: Secondary | ICD-10-CM | POA: Diagnosis not present

## 2022-08-11 MED ORDER — TESTOSTERONE CYPIONATE 200 MG/ML IM SOLN
100.0000 mg | INTRAMUSCULAR | 1 refills | Status: DC
  Filled 2022-08-11: qty 4, 28d supply, fill #0

## 2022-08-12 ENCOUNTER — Other Ambulatory Visit (HOSPITAL_BASED_OUTPATIENT_CLINIC_OR_DEPARTMENT_OTHER): Payer: Self-pay

## 2022-08-12 DIAGNOSIS — F411 Generalized anxiety disorder: Secondary | ICD-10-CM | POA: Diagnosis not present

## 2022-08-12 DIAGNOSIS — F4310 Post-traumatic stress disorder, unspecified: Secondary | ICD-10-CM | POA: Diagnosis not present

## 2022-08-12 DIAGNOSIS — F09 Unspecified mental disorder due to known physiological condition: Secondary | ICD-10-CM | POA: Diagnosis not present

## 2022-08-12 DIAGNOSIS — F32A Depression, unspecified: Secondary | ICD-10-CM | POA: Diagnosis not present

## 2022-08-12 MED ORDER — DESVENLAFAXINE SUCCINATE ER 100 MG PO TB24
100.0000 mg | ORAL_TABLET | Freq: Every day | ORAL | 1 refills | Status: DC
  Filled 2022-08-12 – 2022-12-17 (×3): qty 90, 90d supply, fill #0
  Filled 2023-02-03: qty 30, 30d supply, fill #1
  Filled 2023-04-14: qty 30, 30d supply, fill #2
  Filled 2023-05-13: qty 30, 30d supply, fill #3

## 2022-08-12 MED ORDER — HYDROXYZINE HCL 25 MG PO TABS
25.0000 mg | ORAL_TABLET | Freq: Four times a day (QID) | ORAL | 1 refills | Status: DC | PRN
  Filled 2022-08-12 – 2023-02-09 (×8): qty 360, 90d supply, fill #0

## 2022-08-12 MED ORDER — DESVENLAFAXINE SUCCINATE ER 50 MG PO TB24
50.0000 mg | ORAL_TABLET | Freq: Every day | ORAL | 1 refills | Status: DC
  Filled 2022-08-12 – 2022-11-06 (×2): qty 90, 90d supply, fill #0

## 2022-08-12 MED ORDER — REXULTI 0.5 MG PO TABS
0.5000 mg | ORAL_TABLET | Freq: Every day | ORAL | 2 refills | Status: DC
  Filled 2022-08-12: qty 30, 30d supply, fill #0

## 2022-08-17 MED FILL — Potassium Chloride Tab ER 10 mEq: ORAL | 90 days supply | Qty: 180 | Fill #3 | Status: AC

## 2022-08-18 ENCOUNTER — Other Ambulatory Visit (HOSPITAL_BASED_OUTPATIENT_CLINIC_OR_DEPARTMENT_OTHER): Payer: Self-pay

## 2022-08-18 MED ORDER — TESTOSTERONE CYPIONATE 200 MG/ML IM SOLN
100.0000 mg | INTRAMUSCULAR | 0 refills | Status: DC
  Filled 2022-08-18: qty 12, 168d supply, fill #0
  Filled 2022-08-19: qty 12, 84d supply, fill #0
  Filled 2022-08-24 – 2022-08-31 (×2): qty 12, 168d supply, fill #0
  Filled 2022-09-02: qty 12, 84d supply, fill #0
  Filled 2022-11-12: qty 4, 28d supply, fill #0
  Filled 2023-02-03: qty 4, 28d supply, fill #1

## 2022-08-19 ENCOUNTER — Other Ambulatory Visit: Payer: Self-pay

## 2022-08-19 ENCOUNTER — Other Ambulatory Visit (HOSPITAL_BASED_OUTPATIENT_CLINIC_OR_DEPARTMENT_OTHER): Payer: Self-pay

## 2022-08-20 ENCOUNTER — Other Ambulatory Visit (HOSPITAL_BASED_OUTPATIENT_CLINIC_OR_DEPARTMENT_OTHER): Payer: Self-pay

## 2022-08-20 DIAGNOSIS — F331 Major depressive disorder, recurrent, moderate: Secondary | ICD-10-CM | POA: Diagnosis not present

## 2022-08-20 DIAGNOSIS — F411 Generalized anxiety disorder: Secondary | ICD-10-CM | POA: Diagnosis not present

## 2022-08-21 ENCOUNTER — Other Ambulatory Visit (HOSPITAL_BASED_OUTPATIENT_CLINIC_OR_DEPARTMENT_OTHER): Payer: Self-pay

## 2022-08-21 ENCOUNTER — Other Ambulatory Visit (HOSPITAL_COMMUNITY): Payer: Self-pay

## 2022-08-21 DIAGNOSIS — H903 Sensorineural hearing loss, bilateral: Secondary | ICD-10-CM | POA: Diagnosis not present

## 2022-08-21 DIAGNOSIS — H608X3 Other otitis externa, bilateral: Secondary | ICD-10-CM | POA: Diagnosis not present

## 2022-08-21 MED ORDER — CIPROFLOXACIN-DEXAMETHASONE 0.3-0.1 % OT SUSP
OTIC | 1 refills | Status: DC
  Filled 2022-08-21: qty 7.5, 7d supply, fill #0

## 2022-08-24 ENCOUNTER — Other Ambulatory Visit (HOSPITAL_BASED_OUTPATIENT_CLINIC_OR_DEPARTMENT_OTHER): Payer: Self-pay

## 2022-08-25 ENCOUNTER — Other Ambulatory Visit (HOSPITAL_COMMUNITY): Payer: Self-pay

## 2022-08-27 ENCOUNTER — Other Ambulatory Visit (HOSPITAL_COMMUNITY): Payer: Self-pay

## 2022-08-27 DIAGNOSIS — F331 Major depressive disorder, recurrent, moderate: Secondary | ICD-10-CM | POA: Diagnosis not present

## 2022-08-27 DIAGNOSIS — F411 Generalized anxiety disorder: Secondary | ICD-10-CM | POA: Diagnosis not present

## 2022-08-28 ENCOUNTER — Other Ambulatory Visit (HOSPITAL_COMMUNITY): Payer: Self-pay

## 2022-08-28 MED ORDER — COSENTYX SENSOREADY (300 MG) 150 MG/ML ~~LOC~~ SOAJ: SUBCUTANEOUS | 10 refills | Status: DC

## 2022-08-29 ENCOUNTER — Ambulatory Visit: Payer: Commercial Managed Care - PPO | Attending: Family Medicine | Admitting: Pharmacist

## 2022-08-29 ENCOUNTER — Other Ambulatory Visit (HOSPITAL_COMMUNITY): Payer: Self-pay

## 2022-08-29 DIAGNOSIS — Z79899 Other long term (current) drug therapy: Secondary | ICD-10-CM

## 2022-08-29 MED ORDER — COSENTYX SENSOREADY (300 MG) 150 MG/ML ~~LOC~~ SOAJ
SUBCUTANEOUS | 10 refills | Status: DC
  Filled 2022-08-29 – 2022-09-18 (×3): qty 2, 28d supply, fill #0
  Filled 2022-10-22: qty 2, 28d supply, fill #1
  Filled 2022-11-13: qty 2, 28d supply, fill #2
  Filled 2022-12-18: qty 2, 28d supply, fill #3
  Filled 2022-12-29: qty 2, 28d supply, fill #4
  Filled 2023-02-07 – 2023-02-09 (×3): qty 2, 28d supply, fill #5
  Filled 2023-03-11: qty 2, 28d supply, fill #6
  Filled 2023-04-14: qty 2, 28d supply, fill #7
  Filled 2023-05-14: qty 2, 28d supply, fill #8
  Filled ????-??-??: fill #3

## 2022-08-29 NOTE — Progress Notes (Signed)
   S: Patient presents today for review of their specialty medication.   Patient is currently taking Cosentyx (secukinumab) for psoriatic arthritis. Patient is managed by Dr. Charm Barges for this.   Dosing: Psoriatic arthritis: SubQ: 300 mg every 4 weeks in patients who continue to have active psoriatic arthritis.  Adherence: confirms  Efficacy: reports good control with the medication. Will occasionally require PO steroid bursts for flares but has not required this in some time.   Current adverse effects: S/sx of infection: none  GI upset: none  Headache: none  S/sx of hypersensitivity: none     O:     Lab Results  Component Value Date   WBC 4.5 06/12/2022   HGB 15.5 06/12/2022   HCT 48.0 06/12/2022   MCV 77.5 (L) 06/12/2022   PLT 225.0 06/12/2022      Chemistry      Component Value Date/Time   NA 140 11/05/2021 1805   NA 138 02/18/2021 1427   NA 138 01/07/2017 1243   K 4.5 11/05/2021 1805   K 4.8 01/07/2017 1243   CL 100 11/05/2021 1805   CO2 28 11/05/2021 1805   CO2 25 01/07/2017 1243   BUN 18 11/05/2021 1805   BUN 11 02/18/2021 1427   BUN 18.2 01/07/2017 1243   CREATININE 1.04 11/05/2021 1805   CREATININE 1.1 01/07/2017 1243      Component Value Date/Time   CALCIUM 9.3 11/05/2021 1805   CALCIUM 9.2 01/07/2017 1243   ALKPHOS 187 (H) 11/05/2021 1805   ALKPHOS 453 (H) 01/07/2017 1243   AST 49 (H) 11/05/2021 1805   AST 57 (H) 01/07/2017 1243   ALT 36 11/05/2021 1805   ALT 78 (H) 01/07/2017 1243   BILITOT 0.7 11/05/2021 1805   BILITOT 0.4 04/29/2018 1716   BILITOT 0.58 01/07/2017 1243       A/P: 1. Medication review: Patient is currently on Cosentyx for psoriatic arthritis and is tolerating it well. Reviewed the medication with the patient, including the following: Cosentyx is a monoclonal antibody used in the treatment of ankylosing spondylitis, psoriasis, and psoriatic arthritis. The injection is subq and the medication should be allowed to reach room  temp prior to injecting. Injection sites should be rotated. Possible adverse effects include headaches, GI upset, increased risk of infection and hypersensitivity reactions. No recommendations for any changes at this time.   Butch Penny, PharmD, Patsy [place], CPP Clinical Pharmacist [facility] [phone]

## 2022-08-30 ENCOUNTER — Other Ambulatory Visit (HOSPITAL_BASED_OUTPATIENT_CLINIC_OR_DEPARTMENT_OTHER): Payer: Self-pay

## 2022-08-31 ENCOUNTER — Other Ambulatory Visit (HOSPITAL_BASED_OUTPATIENT_CLINIC_OR_DEPARTMENT_OTHER): Payer: Self-pay

## 2022-08-31 ENCOUNTER — Other Ambulatory Visit: Payer: Self-pay | Admitting: Registered Nurse

## 2022-08-31 DIAGNOSIS — M47816 Spondylosis without myelopathy or radiculopathy, lumbar region: Secondary | ICD-10-CM

## 2022-08-31 DIAGNOSIS — L4050 Arthropathic psoriasis, unspecified: Secondary | ICD-10-CM

## 2022-09-01 ENCOUNTER — Other Ambulatory Visit: Payer: Self-pay

## 2022-09-02 ENCOUNTER — Other Ambulatory Visit: Payer: Self-pay

## 2022-09-02 ENCOUNTER — Other Ambulatory Visit (HOSPITAL_COMMUNITY): Payer: Self-pay

## 2022-09-02 ENCOUNTER — Other Ambulatory Visit (HOSPITAL_BASED_OUTPATIENT_CLINIC_OR_DEPARTMENT_OTHER): Payer: Self-pay

## 2022-09-02 DIAGNOSIS — L408 Other psoriasis: Secondary | ICD-10-CM | POA: Diagnosis not present

## 2022-09-02 MED ORDER — OXYCODONE HCL 10 MG PO TABS
10.0000 mg | ORAL_TABLET | Freq: Four times a day (QID) | ORAL | 0 refills | Status: DC | PRN
  Filled 2022-09-02: qty 60, 15d supply, fill #0

## 2022-09-02 MED ORDER — MORPHINE SULFATE ER 30 MG PO TBCR
30.0000 mg | EXTENDED_RELEASE_TABLET | Freq: Two times a day (BID) | ORAL | 0 refills | Status: DC
  Filled 2022-09-02: qty 60, 30d supply, fill #0

## 2022-09-03 ENCOUNTER — Other Ambulatory Visit: Payer: Self-pay

## 2022-09-03 ENCOUNTER — Other Ambulatory Visit (HOSPITAL_BASED_OUTPATIENT_CLINIC_OR_DEPARTMENT_OTHER): Payer: Self-pay

## 2022-09-03 ENCOUNTER — Encounter: Payer: Self-pay | Admitting: Family Medicine

## 2022-09-03 DIAGNOSIS — F411 Generalized anxiety disorder: Secondary | ICD-10-CM | POA: Diagnosis not present

## 2022-09-03 DIAGNOSIS — F331 Major depressive disorder, recurrent, moderate: Secondary | ICD-10-CM | POA: Diagnosis not present

## 2022-09-03 MED ORDER — ACYCLOVIR 5 % EX OINT
TOPICAL_OINTMENT | CUTANEOUS | 2 refills | Status: DC
  Filled 2022-09-03: qty 30, 7d supply, fill #0
  Filled 2022-10-10: qty 15, 3d supply, fill #1

## 2022-09-05 ENCOUNTER — Other Ambulatory Visit (HOSPITAL_COMMUNITY): Payer: Self-pay

## 2022-09-05 ENCOUNTER — Other Ambulatory Visit: Payer: Self-pay

## 2022-09-05 DIAGNOSIS — F331 Major depressive disorder, recurrent, moderate: Secondary | ICD-10-CM | POA: Diagnosis not present

## 2022-09-05 DIAGNOSIS — F411 Generalized anxiety disorder: Secondary | ICD-10-CM | POA: Diagnosis not present

## 2022-09-08 ENCOUNTER — Other Ambulatory Visit (HOSPITAL_COMMUNITY): Payer: Self-pay

## 2022-09-08 ENCOUNTER — Other Ambulatory Visit: Payer: Self-pay

## 2022-09-08 DIAGNOSIS — D751 Secondary polycythemia: Secondary | ICD-10-CM | POA: Diagnosis not present

## 2022-09-08 DIAGNOSIS — E611 Iron deficiency: Secondary | ICD-10-CM | POA: Diagnosis not present

## 2022-09-10 ENCOUNTER — Other Ambulatory Visit (HOSPITAL_BASED_OUTPATIENT_CLINIC_OR_DEPARTMENT_OTHER): Payer: Self-pay

## 2022-09-10 DIAGNOSIS — F331 Major depressive disorder, recurrent, moderate: Secondary | ICD-10-CM | POA: Diagnosis not present

## 2022-09-10 DIAGNOSIS — F411 Generalized anxiety disorder: Secondary | ICD-10-CM | POA: Diagnosis not present

## 2022-09-10 MED ORDER — PANTOPRAZOLE SODIUM 40 MG PO TBEC
DELAYED_RELEASE_TABLET | ORAL | 1 refills | Status: DC
  Filled 2022-09-10: qty 90, 90d supply, fill #0
  Filled 2022-12-08: qty 90, 90d supply, fill #1

## 2022-09-12 DIAGNOSIS — F331 Major depressive disorder, recurrent, moderate: Secondary | ICD-10-CM | POA: Diagnosis not present

## 2022-09-12 DIAGNOSIS — F411 Generalized anxiety disorder: Secondary | ICD-10-CM | POA: Diagnosis not present

## 2022-09-13 ENCOUNTER — Other Ambulatory Visit (HOSPITAL_BASED_OUTPATIENT_CLINIC_OR_DEPARTMENT_OTHER): Payer: Self-pay

## 2022-09-16 DIAGNOSIS — H608X3 Other otitis externa, bilateral: Secondary | ICD-10-CM | POA: Diagnosis not present

## 2022-09-16 DIAGNOSIS — H903 Sensorineural hearing loss, bilateral: Secondary | ICD-10-CM | POA: Diagnosis not present

## 2022-09-17 ENCOUNTER — Other Ambulatory Visit (HOSPITAL_BASED_OUTPATIENT_CLINIC_OR_DEPARTMENT_OTHER): Payer: Self-pay

## 2022-09-17 ENCOUNTER — Other Ambulatory Visit: Payer: Self-pay

## 2022-09-17 ENCOUNTER — Encounter: Payer: Self-pay | Admitting: Family Medicine

## 2022-09-17 DIAGNOSIS — F411 Generalized anxiety disorder: Secondary | ICD-10-CM | POA: Diagnosis not present

## 2022-09-17 DIAGNOSIS — F331 Major depressive disorder, recurrent, moderate: Secondary | ICD-10-CM | POA: Diagnosis not present

## 2022-09-18 ENCOUNTER — Other Ambulatory Visit (HOSPITAL_COMMUNITY): Payer: Self-pay

## 2022-09-18 DIAGNOSIS — K581 Irritable bowel syndrome with constipation: Secondary | ICD-10-CM | POA: Diagnosis not present

## 2022-09-18 DIAGNOSIS — K5902 Outlet dysfunction constipation: Secondary | ICD-10-CM | POA: Diagnosis not present

## 2022-09-19 ENCOUNTER — Other Ambulatory Visit (HOSPITAL_COMMUNITY): Payer: Self-pay

## 2022-09-19 ENCOUNTER — Encounter: Payer: Commercial Managed Care - PPO | Admitting: Physical Medicine & Rehabilitation

## 2022-09-19 DIAGNOSIS — F411 Generalized anxiety disorder: Secondary | ICD-10-CM | POA: Diagnosis not present

## 2022-09-19 DIAGNOSIS — F331 Major depressive disorder, recurrent, moderate: Secondary | ICD-10-CM | POA: Diagnosis not present

## 2022-09-20 ENCOUNTER — Other Ambulatory Visit (HOSPITAL_BASED_OUTPATIENT_CLINIC_OR_DEPARTMENT_OTHER): Payer: Self-pay

## 2022-09-21 DIAGNOSIS — M25531 Pain in right wrist: Secondary | ICD-10-CM | POA: Diagnosis not present

## 2022-09-22 ENCOUNTER — Other Ambulatory Visit: Payer: Self-pay

## 2022-09-22 DIAGNOSIS — K581 Irritable bowel syndrome with constipation: Secondary | ICD-10-CM | POA: Diagnosis not present

## 2022-09-22 DIAGNOSIS — K5902 Outlet dysfunction constipation: Secondary | ICD-10-CM | POA: Diagnosis not present

## 2022-09-23 ENCOUNTER — Other Ambulatory Visit: Payer: Self-pay | Admitting: Physical Medicine & Rehabilitation

## 2022-09-23 ENCOUNTER — Other Ambulatory Visit (HOSPITAL_BASED_OUTPATIENT_CLINIC_OR_DEPARTMENT_OTHER): Payer: Self-pay

## 2022-09-23 ENCOUNTER — Other Ambulatory Visit: Payer: Self-pay

## 2022-09-23 DIAGNOSIS — M47816 Spondylosis without myelopathy or radiculopathy, lumbar region: Secondary | ICD-10-CM

## 2022-09-23 MED ORDER — MORPHINE SULFATE ER 30 MG PO TBCR
30.0000 mg | EXTENDED_RELEASE_TABLET | Freq: Two times a day (BID) | ORAL | 0 refills | Status: DC
Start: 2022-09-23 — End: 2022-10-01
  Filled 2022-09-23 – 2022-10-01 (×2): qty 60, 30d supply, fill #0

## 2022-09-23 NOTE — Telephone Encounter (Signed)
Patient requesting early fill of 10/01/22 Patient will be out of town and don't want to run out.

## 2022-09-24 ENCOUNTER — Other Ambulatory Visit (HOSPITAL_BASED_OUTPATIENT_CLINIC_OR_DEPARTMENT_OTHER): Payer: Self-pay

## 2022-09-24 DIAGNOSIS — F411 Generalized anxiety disorder: Secondary | ICD-10-CM | POA: Diagnosis not present

## 2022-09-24 DIAGNOSIS — F331 Major depressive disorder, recurrent, moderate: Secondary | ICD-10-CM | POA: Diagnosis not present

## 2022-09-24 MED ORDER — METHOTREXATE SODIUM 2.5 MG PO TABS
15.0000 mg | ORAL_TABLET | ORAL | 0 refills | Status: DC
  Filled 2022-09-24 – 2023-01-04 (×3): qty 72, 84d supply, fill #0

## 2022-09-26 ENCOUNTER — Encounter: Payer: Self-pay | Admitting: Family Medicine

## 2022-09-26 DIAGNOSIS — F411 Generalized anxiety disorder: Secondary | ICD-10-CM | POA: Diagnosis not present

## 2022-09-26 DIAGNOSIS — F331 Major depressive disorder, recurrent, moderate: Secondary | ICD-10-CM | POA: Diagnosis not present

## 2022-09-29 ENCOUNTER — Ambulatory Visit (INDEPENDENT_AMBULATORY_CARE_PROVIDER_SITE_OTHER): Payer: Commercial Managed Care - PPO | Admitting: Family Medicine

## 2022-09-29 ENCOUNTER — Encounter: Payer: Self-pay | Admitting: Family Medicine

## 2022-09-29 VITALS — BP 124/80 | HR 105 | Temp 98.8°F | Ht 67.5 in | Wt 202.6 lb

## 2022-09-29 DIAGNOSIS — R635 Abnormal weight gain: Secondary | ICD-10-CM

## 2022-09-29 DIAGNOSIS — T50905A Adverse effect of unspecified drugs, medicaments and biological substances, initial encounter: Secondary | ICD-10-CM

## 2022-09-29 NOTE — Progress Notes (Unsigned)
Established Patient Office Visit  Subjective   Patient ID: Jesse Bowers, adult    DOB: [birth year]  Age: 58 y.o. MRN: [id number]  Chief Complaint  Patient presents with  . Medical Management of Chronic Issues    Patient states that he has seen the ENT monthly, states that they are cleaning them out, still getting very goopy and draining. States that he has had improvement overall with his ears.   Is supposed to go back to see the dermatologist in August.    Current Outpatient Medications  Medication Instructions  . acyclovir ointment (ZOVIRAX) 5 % Apply topically every 3 (three) hours. For acute breakout of cold sores for 7 days.  . baclofen (LIORESAL) 20-40 mg, Oral, 4 times daily, 1 tablet with breakfast, lunch, and dinner and 2 tablets at bedtime  . Clobetasol Propionate Emulsion 0.05 % topical foam Apply a thin layer to the affected area(s) on the scalp  . desonide (DESOWEN) 0.05 % cream Apply to affected area(s) twice daily  . desvenlafaxine (PRISTIQ) 100 MG 24 hr tablet Take 1 tablet (100 mg total) by mouth daily. (total dose 150mg )  . desvenlafaxine (PRISTIQ) 50 MG 24 hr tablet Take 1 tablet (50 mg total) by mouth daily (total dose 150mg )  . folic acid (KP FOLIC ACID) 1 MG tablet Take 1 tablet po every day  . furosemide (LASIX) 80 mg, Oral, Daily  . furosemide (LASIX) 20 mg, Oral, Daily PRN  . hydrOXYzine (ATARAX) 25 MG tablet Take 1 tablet (25 mg total) by mouth 4 (four) times daily as needed for anxiety  . Ibsrela 50 mg, Oral, 2 times daily  . metFORMIN (GLUCOPHAGE) 500 mg, Oral, 2 times daily with meals  . methotrexate (RHEUMATREX) 15 mg, Oral, Weekly  . metoprolol tartrate (LOPRESSOR) 25 mg, Oral, 2 times daily PRN  . morphine (MS CONTIN) 30 mg, Oral, Every 12 hours  . mupirocin ointment (BACTROBAN) 2 % Apply a small amount to affected area of nose twice daily for 14 days.  [name] OVER THE COUNTER MEDICATION Tums-1000mg  once a day  . Oxycodone HCl 10 mg, Oral, Every 6  hours PRN  . Pancrelipase, Lip-Prot-Amyl, (CREON) 24000-76000 units CPEP Take 2 capsules by mouth in the morning and 2 capsules at noon and 2 capsules in the evening. Take with meals.  . pantoprazole (PROTONIX) 40 MG tablet Take 1 tablet (40 mg total) by mouth every morning before breakfast.  . potassium chloride (KLOR-CON) 10 MEQ tablet Take 2 tablets by mouth every day.  . Secukinumab, 300 MG Dose, (COSENTYX SENSOREADY, 300 MG,) 150 MG/ML SOAJ Inject 2(two) pens subcutaneous every 4 weeks  . testosterone cypionate (DEPOTESTOSTERONE CYPIONATE) 100 mg, Intramuscular, Weekly  . ursodiol (ACTIGALL) 250 MG tablet Take 1 tablet by mouth at lunch and dinner in addition to ursodiol 500 mg for a total 750 mg dose.  . ursodiol (ACTIGALL) 500 MG tablet Take 1 tablet (500 mg total) by mouth 2 (two) times daily. Take with 250 mg tablet for total of 750 mg daily at lunchtime and at dinnertime.  . Vitamin D (Ergocalciferol) (DRISDOL) 50,000 Units, Oral, Weekly  . Xarelto 20 mg, Oral, (Dosepack) Nightly - one time    Patient Active Problem List   Diagnosis Date Noted  . Lumbar disc disease 08/06/2022  . Iliotibial band syndrome 08/06/2022  . Non-recurrent acute suppurative otitis media of both ears without spontaneous rupture of tympanic membranes 07/09/2022  . Weight gain due to medication 07/09/2022  . Pancreatic insufficiency 04/09/2022  .  Posterior vitreous detachment of right eye 11/18/2021  . Spondylosis of lumbar spine 10/03/2020  . Pseudophakia of both eyes 05/07/2020  . Retinal telangiectasis of both eyes 05/07/2020  . Lattice degeneration of peripheral retina, left 09/08/2019  . Lattice degeneration, right eye 09/08/2019  . Chronic diastolic heart failure (HCC) 05/24/2019  . Urinary dysfunction 04/12/2019  . Osteoarthritis of right knee 08/23/2018  . Constipation due to opioid therapy 03/30/2018  . SNHL (sensorineural hearing loss) 12/04/2017  . Abnormal urinary stream 12/03/2017  .  Osteoarthritis of carpometacarpal (CMC) joint of thumb 11/30/2017  . Muscle weakness 11/17/2017  . Transient vision disturbance 11/12/2017  . Bilateral hand pain 10/30/2017  . Pain of left hip joint 10/09/2017  . Gynecomastia 07/10/2017  . Iron deficiency anemia 07/05/2017  . Spasticity 05/20/2017  . Lumbar radiculitis 04/20/2017  . Ulnar 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, left 11/28/2016  . Idiopathic peripheral neuropathy 11/28/2016  . 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 Long term (current) use of anticoagulants 08/23/2015  . Right upper quadrant abdominal pain 08/23/2015  . Memory loss 05/10/2015  . Gait abnormality 04/07/2015  . Alkaline phosphatase elevation 04/07/2015  . History of thrombosis 03/26/2015  . Medial epicondylitis 02/07/2015  . Cognitive decline 12/21/2014  . Leukopenia 12/05/2014  . Rotator cuff syndrome of right shoulder 10/27/2014  . Status post left knee replacement 08/22/2014  . Left lateral epicondylitis 08/22/2014  . Chronic cerebral ischemia 08/18/2014  . Arthrofibrosis of knee joint 08/17/2014  . Cubital canal compression syndrome, left 08/17/2014  . 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 Chronic pain syndrome 04/10/2014  . Insomnia 04/10/2014  . Chronic non-specific white matter lesions on MRI 04/10/2014  . CFS (chronic fatigue syndrome) 04/10/2014  . Biceps tendonitis on left 03/01/2014  . Polycythemia vera (HCC) 12/27/2013  . H/O TIA (transient ischemic attack) and stroke 12/27/2013  . Neck pain 12/27/2013  . OSA (obstructive sleep apnea) 12/08/2013  . 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 Protein C deficiency (HCC) 08/01/2013  . Post traumatic stress disorder (PTSD) 08/01/2013  . Speech abnormality 07/25/2013  . Obesity 05/10/2013  . Lower extremity edema 05/10/2013  . GERD (gastroesophageal reflux disease) 05/10/2013  . Arthritis 05/10/2013  . OA (osteoarthritis) of knee 03/15/2013  . Headache 10/25/2012  . Palpitations 10/18/2012   . Fatty liver determined by biopsy 06/01/2012  . Arthropathic psoriasis, unspecified (HCC) 04/29/2012  . Abnormal liver enzymes 03/29/2012  . Psoriatic arthritis (HCC) 12/29/2011  . Left Renal Hydronephrosis 12/11/2010  . 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 Celiac disease 05/27/2010  . Thyroid nodule 05/27/2010  . Male-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     Objective:     BP 124/80 (BP Location: Left Arm, Patient Position: Sitting, Cuff Size: Large)   Pulse (!) 105   Temp 98.8 F (37.1 C) (Oral)   Ht 5' 7.5" (1.715 m)   Wt 202 lb 9.6 oz (91.9 kg)   SpO2 99%   BMI 31.26 kg/m  {Vitals History (Optional):23777}  Physical Exam Vitals reviewed.  Constitutional:      Appearance: Normal appearance. He is well-groomed and normal weight.  Eyes:     Extraocular Movements: Extraocular movements intact.     Conjunctiva/sclera: Conjunctivae normal.  Neck:     Thyroid: No thyromegaly.  Cardiovascular:     Rate and Rhythm: Normal rate and regular rhythm.     Heart sounds: S1 normal and S2 normal. No  murmur heard. Pulmonary:     Effort: Pulmonary effort is normal.     Breath sounds: Normal breath sounds and air entry. No rales.  Abdominal:     General: Abdomen is flat. Bowel sounds are normal.  Musculoskeletal:     Right lower leg: No edema.     Left lower leg: No edema.  Neurological:     General: No focal deficit present.     Mental Status: He is alert and oriented to person, place, and time.     Gait: Gait is intact.  Psychiatric:        Mood and Affect: Mood and affect normal.     No results found for any visits on 09/29/22.  {Labs (Optional):23779}  The ASCVD Risk score (Arnett DK, et al., 2019) failed to calculate for the following reasons:   The patient has a prior MI or stroke diagnosis    Assessment & Plan:  There are no diagnoses linked to this encounter.    Return in about 6 months (around 04/01/2023).    Karie Georges, MD

## 2022-09-30 DIAGNOSIS — M13841 Other specified arthritis, right hand: Secondary | ICD-10-CM | POA: Diagnosis not present

## 2022-09-30 DIAGNOSIS — M25531 Pain in right wrist: Secondary | ICD-10-CM | POA: Diagnosis not present

## 2022-09-30 NOTE — Assessment & Plan Note (Addendum)
Weight check today shows that pt has lost 7 pounds since the last visit, he is doing well on the metformin 500 mg BID, will continue this medication. I will see him back in 6 months for follow up.

## 2022-10-01 ENCOUNTER — Other Ambulatory Visit: Payer: Self-pay

## 2022-10-01 ENCOUNTER — Other Ambulatory Visit (HOSPITAL_BASED_OUTPATIENT_CLINIC_OR_DEPARTMENT_OTHER): Payer: Self-pay

## 2022-10-01 ENCOUNTER — Other Ambulatory Visit: Payer: Self-pay | Admitting: Cardiovascular Disease

## 2022-10-01 ENCOUNTER — Encounter: Payer: Self-pay | Admitting: Physical Medicine & Rehabilitation

## 2022-10-01 DIAGNOSIS — M47816 Spondylosis without myelopathy or radiculopathy, lumbar region: Secondary | ICD-10-CM

## 2022-10-01 DIAGNOSIS — L4050 Arthropathic psoriasis, unspecified: Secondary | ICD-10-CM

## 2022-10-01 MED ORDER — METOPROLOL TARTRATE 25 MG PO TABS
25.0000 mg | ORAL_TABLET | Freq: Two times a day (BID) | ORAL | 3 refills | Status: AC | PRN
  Filled 2022-10-01: qty 30, 15d supply, fill #0
  Filled 2023-02-17: qty 30, 15d supply, fill #1
  Filled 2023-07-20: qty 30, 15d supply, fill #2

## 2022-10-01 MED ORDER — MORPHINE SULFATE ER 30 MG PO TBCR
30.0000 mg | EXTENDED_RELEASE_TABLET | Freq: Two times a day (BID) | ORAL | 0 refills | Status: DC
Start: 2022-10-01 — End: 2022-10-30
  Filled 2022-10-01: qty 60, 30d supply, fill #0

## 2022-10-01 MED ORDER — FUROSEMIDE 80 MG PO TABS
80.0000 mg | ORAL_TABLET | Freq: Every day | ORAL | 3 refills | Status: DC
  Filled 2022-10-01: qty 90, 90d supply, fill #0
  Filled 2022-10-07 – 2023-01-01 (×2): qty 90, 90d supply, fill #1
  Filled 2023-04-01: qty 90, 90d supply, fill #2
  Filled 2023-06-27: qty 90, 90d supply, fill #3

## 2022-10-01 MED ORDER — FUROSEMIDE 20 MG PO TABS
20.0000 mg | ORAL_TABLET | Freq: Every day | ORAL | 1 refills | Status: DC | PRN
  Filled 2022-10-01: qty 30, 30d supply, fill #0
  Filled 2022-12-08: qty 30, 30d supply, fill #1

## 2022-10-02 ENCOUNTER — Other Ambulatory Visit: Payer: Self-pay

## 2022-10-03 ENCOUNTER — Other Ambulatory Visit: Payer: Self-pay

## 2022-10-07 ENCOUNTER — Encounter: Payer: Self-pay | Admitting: Family Medicine

## 2022-10-07 ENCOUNTER — Other Ambulatory Visit: Payer: Self-pay

## 2022-10-07 ENCOUNTER — Other Ambulatory Visit (HOSPITAL_BASED_OUTPATIENT_CLINIC_OR_DEPARTMENT_OTHER): Payer: Self-pay

## 2022-10-08 DIAGNOSIS — F411 Generalized anxiety disorder: Secondary | ICD-10-CM | POA: Diagnosis not present

## 2022-10-08 DIAGNOSIS — F331 Major depressive disorder, recurrent, moderate: Secondary | ICD-10-CM | POA: Diagnosis not present

## 2022-10-09 ENCOUNTER — Other Ambulatory Visit (HOSPITAL_BASED_OUTPATIENT_CLINIC_OR_DEPARTMENT_OTHER): Payer: Self-pay

## 2022-10-09 DIAGNOSIS — K5902 Outlet dysfunction constipation: Secondary | ICD-10-CM | POA: Diagnosis not present

## 2022-10-09 DIAGNOSIS — Z79899 Other long term (current) drug therapy: Secondary | ICD-10-CM | POA: Diagnosis not present

## 2022-10-09 DIAGNOSIS — L400 Psoriasis vulgaris: Secondary | ICD-10-CM | POA: Diagnosis not present

## 2022-10-09 DIAGNOSIS — K581 Irritable bowel syndrome with constipation: Secondary | ICD-10-CM | POA: Diagnosis not present

## 2022-10-09 MED ORDER — TENAPANOR HCL 50 MG PO TABS
50.0000 mg | ORAL_TABLET | Freq: Two times a day (BID) | ORAL | 1 refills | Status: DC
  Filled 2022-10-09: qty 180, 90d supply, fill #0
  Filled 2023-01-20: qty 180, 90d supply, fill #1

## 2022-10-10 ENCOUNTER — Other Ambulatory Visit (HOSPITAL_BASED_OUTPATIENT_CLINIC_OR_DEPARTMENT_OTHER): Payer: Self-pay

## 2022-10-10 ENCOUNTER — Other Ambulatory Visit: Payer: Self-pay

## 2022-10-10 DIAGNOSIS — D751 Secondary polycythemia: Secondary | ICD-10-CM | POA: Diagnosis not present

## 2022-10-10 DIAGNOSIS — F411 Generalized anxiety disorder: Secondary | ICD-10-CM | POA: Diagnosis not present

## 2022-10-10 DIAGNOSIS — F331 Major depressive disorder, recurrent, moderate: Secondary | ICD-10-CM | POA: Diagnosis not present

## 2022-10-13 ENCOUNTER — Other Ambulatory Visit (HOSPITAL_BASED_OUTPATIENT_CLINIC_OR_DEPARTMENT_OTHER): Payer: Self-pay

## 2022-10-13 MED ORDER — METHOTREXATE SODIUM 2.5 MG PO TABS
15.0000 mg | ORAL_TABLET | ORAL | 0 refills | Status: DC
  Filled 2022-10-13: qty 72, 84d supply, fill #0

## 2022-10-14 ENCOUNTER — Other Ambulatory Visit: Payer: Self-pay

## 2022-10-14 ENCOUNTER — Other Ambulatory Visit (HOSPITAL_BASED_OUTPATIENT_CLINIC_OR_DEPARTMENT_OTHER): Payer: Self-pay

## 2022-10-14 MED ORDER — TESTOSTERONE 25 MG/2.5GM (1%) TD GEL
1.0000 "application " | Freq: Every day | TRANSDERMAL | 3 refills | Status: DC
  Filled 2022-10-14: qty 75, 30d supply, fill #0
  Filled 2022-11-12: qty 75, 30d supply, fill #1
  Filled 2023-01-15: qty 75, 30d supply, fill #2
  Filled 2023-03-09: qty 75, 30d supply, fill #3

## 2022-10-15 ENCOUNTER — Other Ambulatory Visit (HOSPITAL_BASED_OUTPATIENT_CLINIC_OR_DEPARTMENT_OTHER): Payer: Self-pay

## 2022-10-17 ENCOUNTER — Other Ambulatory Visit (HOSPITAL_BASED_OUTPATIENT_CLINIC_OR_DEPARTMENT_OTHER): Payer: Self-pay

## 2022-10-17 ENCOUNTER — Encounter (HOSPITAL_BASED_OUTPATIENT_CLINIC_OR_DEPARTMENT_OTHER): Payer: Self-pay

## 2022-10-17 ENCOUNTER — Other Ambulatory Visit: Payer: Self-pay | Admitting: Physical Medicine & Rehabilitation

## 2022-10-17 DIAGNOSIS — M47816 Spondylosis without myelopathy or radiculopathy, lumbar region: Secondary | ICD-10-CM

## 2022-10-17 DIAGNOSIS — L4050 Arthropathic psoriasis, unspecified: Secondary | ICD-10-CM

## 2022-10-17 MED ORDER — OXYCODONE HCL 10 MG PO TABS
10.0000 mg | ORAL_TABLET | Freq: Four times a day (QID) | ORAL | 0 refills | Status: DC | PRN
  Filled 2022-10-17: qty 60, 15d supply, fill #0

## 2022-10-19 ENCOUNTER — Other Ambulatory Visit (HOSPITAL_BASED_OUTPATIENT_CLINIC_OR_DEPARTMENT_OTHER): Payer: Self-pay

## 2022-10-20 ENCOUNTER — Other Ambulatory Visit (HOSPITAL_BASED_OUTPATIENT_CLINIC_OR_DEPARTMENT_OTHER): Payer: Self-pay

## 2022-10-20 DIAGNOSIS — H04123 Dry eye syndrome of bilateral lacrimal glands: Secondary | ICD-10-CM | POA: Diagnosis not present

## 2022-10-20 DIAGNOSIS — Z961 Presence of intraocular lens: Secondary | ICD-10-CM | POA: Diagnosis not present

## 2022-10-20 DIAGNOSIS — H35413 Lattice degeneration of retina, bilateral: Secondary | ICD-10-CM | POA: Diagnosis not present

## 2022-10-20 DIAGNOSIS — H353131 Nonexudative age-related macular degeneration, bilateral, early dry stage: Secondary | ICD-10-CM | POA: Diagnosis not present

## 2022-10-20 DIAGNOSIS — H538 Other visual disturbances: Secondary | ICD-10-CM | POA: Diagnosis not present

## 2022-10-20 DIAGNOSIS — H6123 Impacted cerumen, bilateral: Secondary | ICD-10-CM | POA: Diagnosis not present

## 2022-10-21 ENCOUNTER — Other Ambulatory Visit (HOSPITAL_BASED_OUTPATIENT_CLINIC_OR_DEPARTMENT_OTHER): Payer: Self-pay

## 2022-10-21 ENCOUNTER — Encounter: Payer: Self-pay | Admitting: Family Medicine

## 2022-10-21 DIAGNOSIS — K5902 Outlet dysfunction constipation: Secondary | ICD-10-CM | POA: Diagnosis not present

## 2022-10-21 DIAGNOSIS — E669 Obesity, unspecified: Secondary | ICD-10-CM

## 2022-10-21 DIAGNOSIS — K581 Irritable bowel syndrome with constipation: Secondary | ICD-10-CM | POA: Diagnosis not present

## 2022-10-21 IMAGING — CT CT RENAL STONE PROTOCOL
2 of 3 series · 15 of 46 positions shown, 17 images · non-contrast
Comparison: 01/13/2018
COMPARISON: 01/13/2018

Addendum:
CLINICAL DATA: Left-sided flank pain and dysuria

EXAM:
CT ABDOMEN AND PELVIS WITHOUT CONTRAST
TECHNIQUE: Multidetector CT imaging of the abdomen and pelvis was performed
following the standard protocol without IV contrast.

[Series 4: lung bases · axial · 0.73mm/px · z∈[+1404,+1574]mm · 12 of 99 slices shown, 14 images]
[im 7/99  soft-tissue]
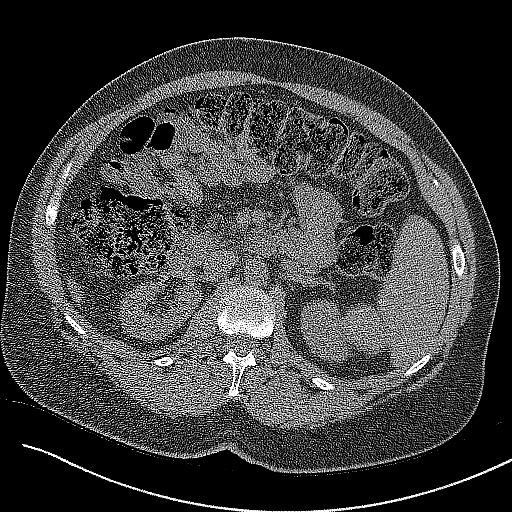
[im 7/99  bone]
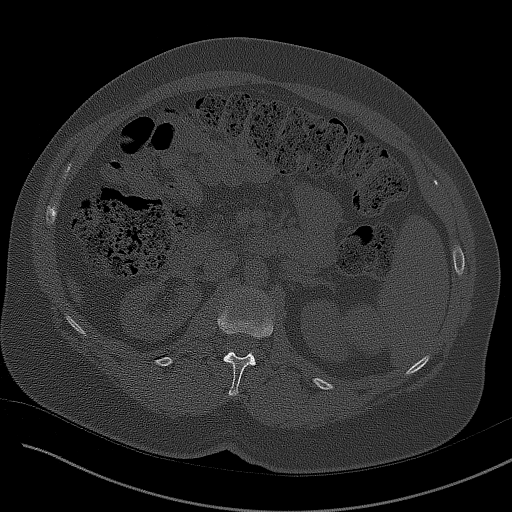
[im 13/99  soft-tissue]
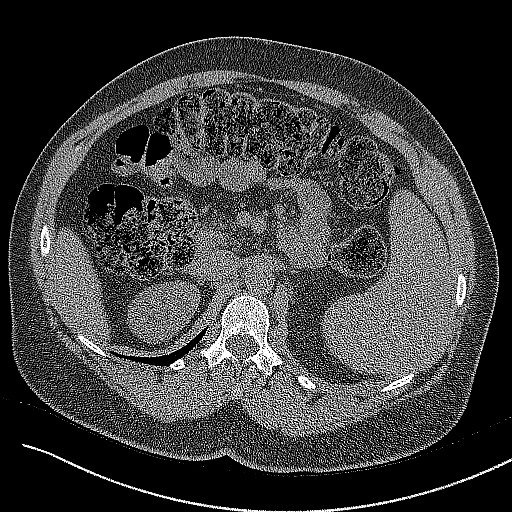
[im 23/99  soft-tissue]
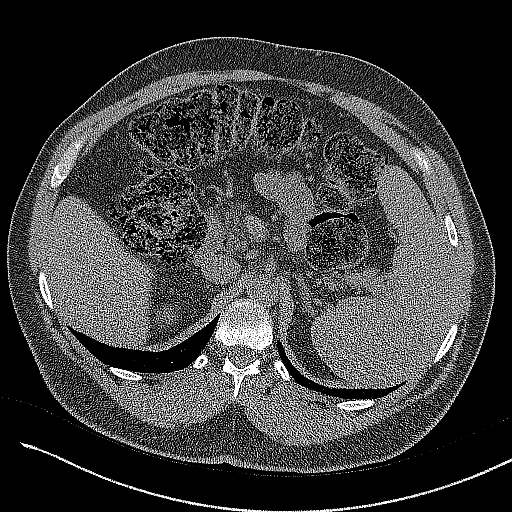
[im 29/99  soft-tissue]
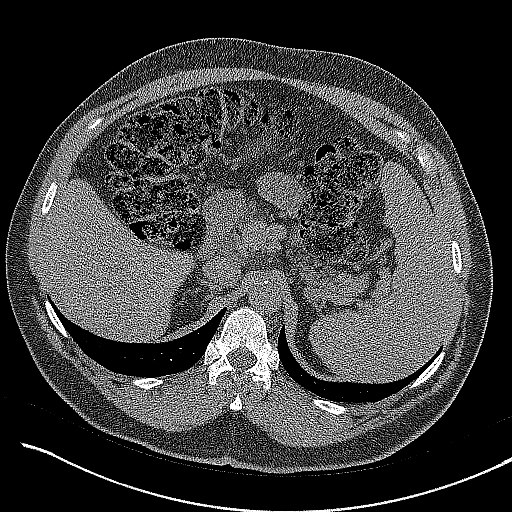
[im 38/99  soft-tissue]
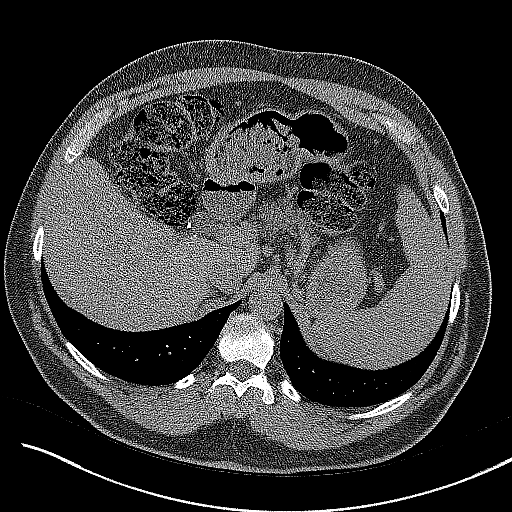
[im 45/99  soft-tissue]
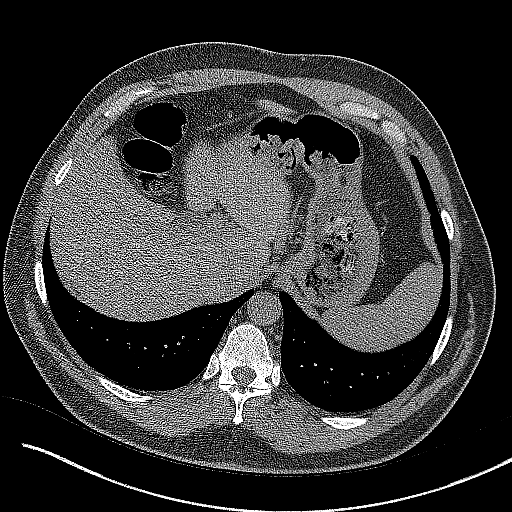
[im 54/99  soft-tissue]
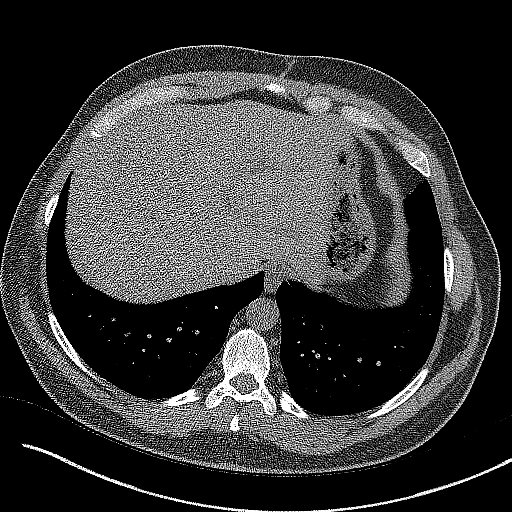
[im 61/99  soft-tissue]
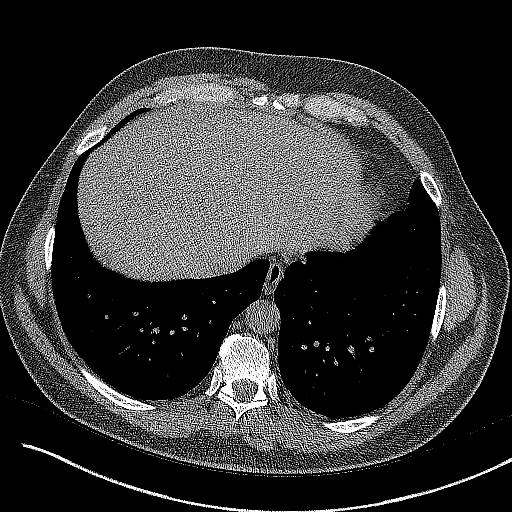
[im 70/99  soft-tissue]
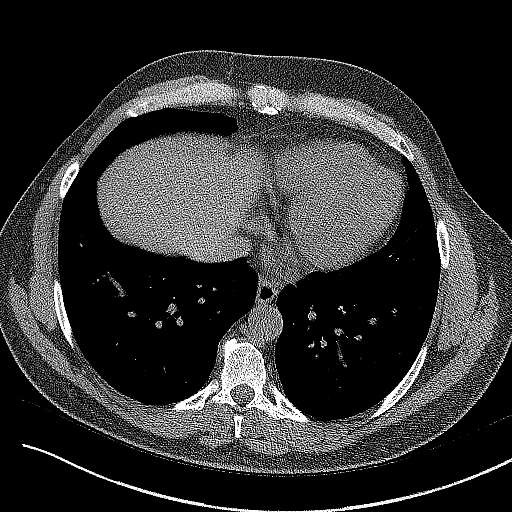
[im 70/99  bone]
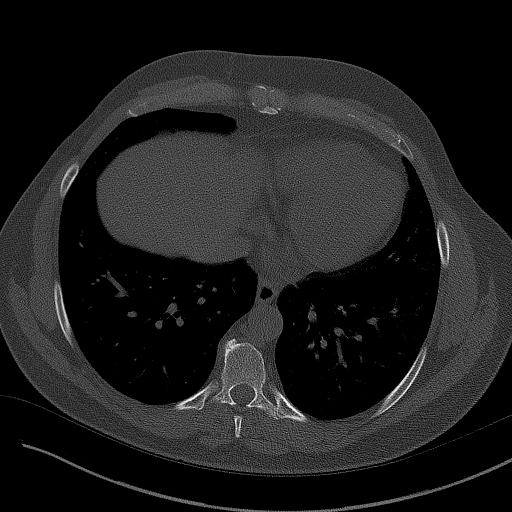
[im 76/99  soft-tissue]
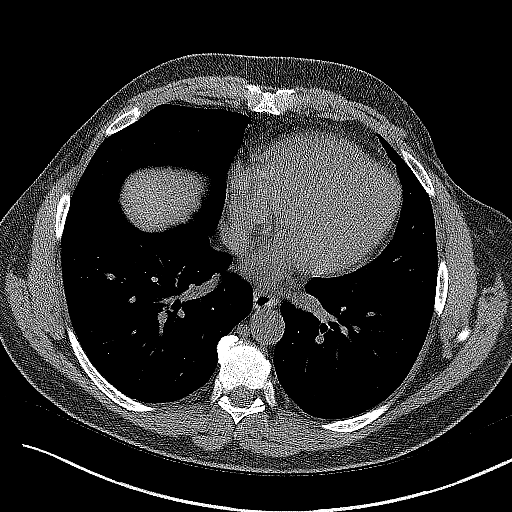
[im 86/99  soft-tissue]
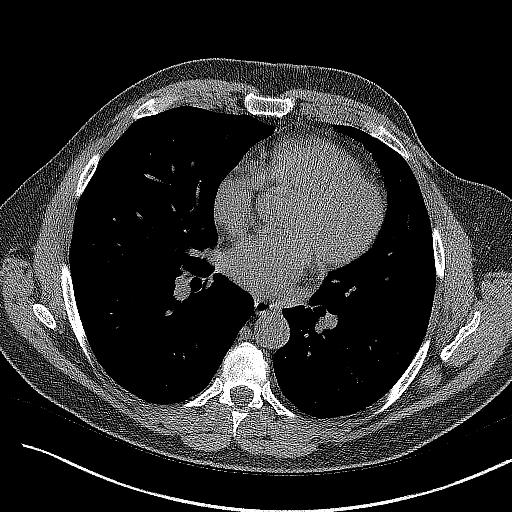
[im 92/99  soft-tissue]
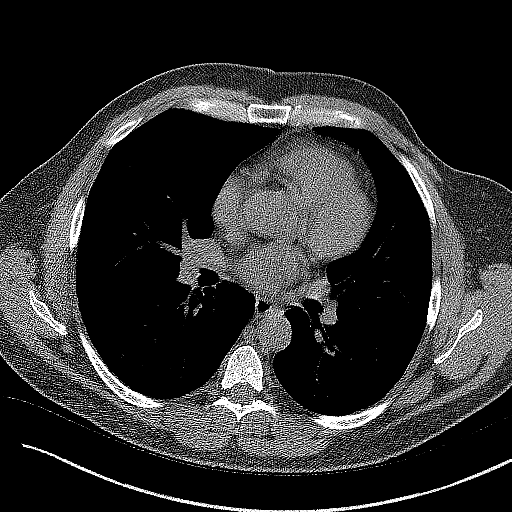

[Series 5: coronal · coronal · 0.78mm/px · 3 of 165 slices shown]
[im 55/165  soft-tissue]
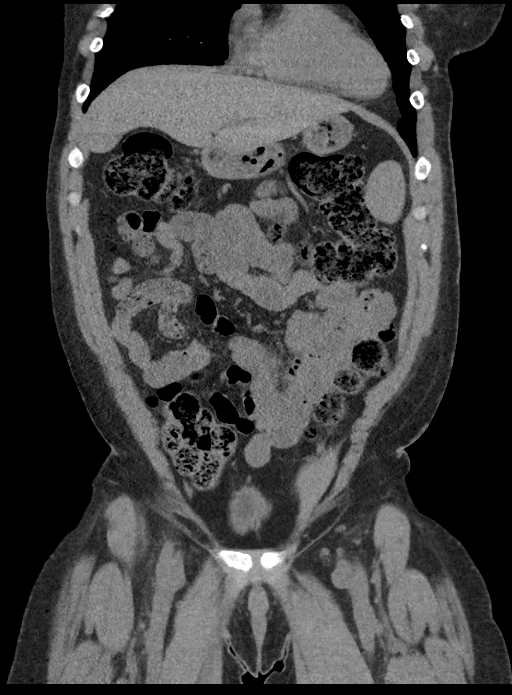
[im 73/165  soft-tissue]
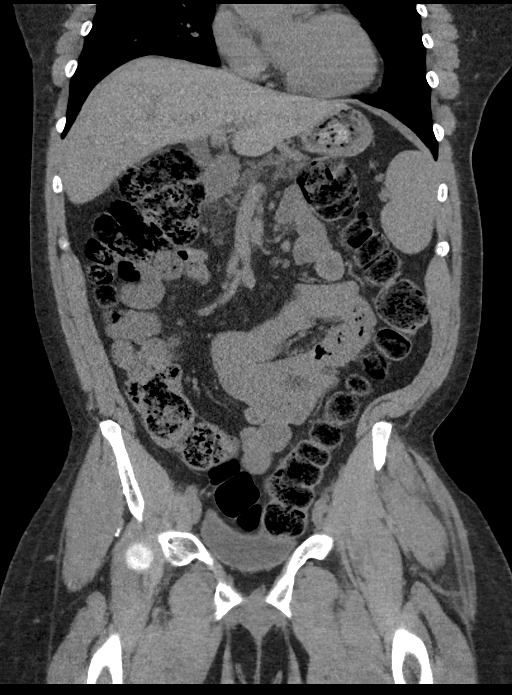
[im 92/165  soft-tissue]
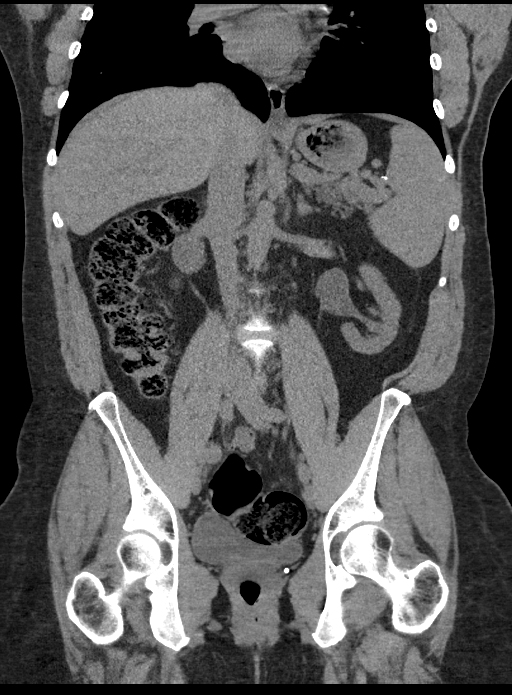

[15 of 46 positions shown; findings below may reference images not displayed]

FINDINGS: Lower chest: No acute abnormality.

Hepatobiliary: No focal liver abnormality is seen. Status post
cholecystectomy. No biliary dilatation.

Pancreas: Unremarkable. No pancreatic ductal dilatation or
surrounding inflammatory changes.

Spleen: Mild prominence of the spleen is noted although maintains
its normal spleniform shape no mass is seen.

Adrenals/Urinary Tract: Adrenal glands are within normal limits.
Kidneys demonstrate a normal appearance. No renal calculi or
obstructive changes are seen. The ureters are within normal limits.
The bladder is partially distended.

Stomach/Bowel: Colon shows no obstructive or inflammatory changes.
The appendix is well visualized and within normal limits. No
obstructive or inflammatory changes of the small bowel are seen.
Stomach is within normal limits.

Vascular/Lymphatic: No significant vascular findings are present. No
enlarged abdominal or pelvic lymph nodes.

Reproductive: Prostate is unremarkable.

Other: No abdominal wall hernia or abnormality. No abdominopelvic
ascites.

Musculoskeletal: No acute or significant osseous findings.
IMPRESSION: No renal calculi or obstructive changes are noted. No acute
abnormality noted.

ADDENDUM:
Corrections to the original report. In the reproductive section, the
prostate was erroneously reported as present. No prostate is a
present and the patient is status post hysterectomy.

The spleen measures approximately 15.7 cm in AP dimension which is
slightly smaller than that seen on prior exam and likely variation
related to the imaging technique. Again it maintains its normal
spleniform shape.

Maximum renal length on the left is 10.4 cm. Maximal length on the
right is 9.8 cm.

*** End of Addendum ***
FINDINGS: Lower chest: No acute abnormality.

Hepatobiliary: No focal liver abnormality is seen. Status post
cholecystectomy. No biliary dilatation.

Pancreas: Unremarkable. No pancreatic ductal dilatation or
surrounding inflammatory changes.

Spleen: Mild prominence of the spleen is noted although maintains
its normal spleniform shape no mass is seen.

Adrenals/Urinary Tract: Adrenal glands are within normal limits.
Kidneys demonstrate a normal appearance. No renal calculi or
obstructive changes are seen. The ureters are within normal limits.
The bladder is partially distended.

Stomach/Bowel: Colon shows no obstructive or inflammatory changes.
The appendix is well visualized and within normal limits. No
obstructive or inflammatory changes of the small bowel are seen.
Stomach is within normal limits.

Vascular/Lymphatic: No significant vascular findings are present. No
enlarged abdominal or pelvic lymph nodes.

Reproductive: Prostate is unremarkable.

Other: No abdominal wall hernia or abnormality. No abdominopelvic
ascites.

Musculoskeletal: No acute or significant osseous findings.
IMPRESSION: No renal calculi or obstructive changes are noted. No acute
abnormality noted.

## 2022-10-22 ENCOUNTER — Other Ambulatory Visit (HOSPITAL_COMMUNITY): Payer: Self-pay

## 2022-10-22 ENCOUNTER — Other Ambulatory Visit (HOSPITAL_BASED_OUTPATIENT_CLINIC_OR_DEPARTMENT_OTHER): Payer: Self-pay

## 2022-10-22 DIAGNOSIS — F411 Generalized anxiety disorder: Secondary | ICD-10-CM | POA: Diagnosis not present

## 2022-10-22 DIAGNOSIS — F331 Major depressive disorder, recurrent, moderate: Secondary | ICD-10-CM | POA: Diagnosis not present

## 2022-10-22 MED ORDER — PHENTERMINE HCL 15 MG PO CAPS
15.0000 mg | ORAL_CAPSULE | ORAL | 0 refills | Status: DC
Start: 2022-10-22 — End: 2022-12-08
  Filled 2022-10-22: qty 30, 30d supply, fill #0

## 2022-10-22 MED ORDER — TOPIRAMATE 50 MG PO TABS
ORAL_TABLET | ORAL | 2 refills | Status: DC
Start: 2022-10-22 — End: 2022-11-21
  Filled 2022-10-22: qty 60, 39d supply, fill #0

## 2022-10-22 MED ORDER — PHENTERMINE HCL 15 MG PO CAPS
15.0000 mg | ORAL_CAPSULE | ORAL | 0 refills | Status: DC
Start: 2022-11-20 — End: 2022-11-21
  Filled 2022-10-22: qty 30, 30d supply, fill #0

## 2022-10-23 ENCOUNTER — Other Ambulatory Visit (HOSPITAL_COMMUNITY): Payer: Self-pay

## 2022-10-24 DIAGNOSIS — F331 Major depressive disorder, recurrent, moderate: Secondary | ICD-10-CM | POA: Diagnosis not present

## 2022-10-24 DIAGNOSIS — F411 Generalized anxiety disorder: Secondary | ICD-10-CM | POA: Diagnosis not present

## 2022-10-28 ENCOUNTER — Encounter: Payer: Self-pay | Admitting: Family Medicine

## 2022-10-29 DIAGNOSIS — F411 Generalized anxiety disorder: Secondary | ICD-10-CM | POA: Diagnosis not present

## 2022-10-29 DIAGNOSIS — R39198 Other difficulties with micturition: Secondary | ICD-10-CM | POA: Diagnosis not present

## 2022-10-29 DIAGNOSIS — F331 Major depressive disorder, recurrent, moderate: Secondary | ICD-10-CM | POA: Diagnosis not present

## 2022-10-30 ENCOUNTER — Other Ambulatory Visit: Payer: Self-pay | Admitting: Physical Medicine & Rehabilitation

## 2022-10-30 ENCOUNTER — Other Ambulatory Visit: Payer: Self-pay

## 2022-10-30 ENCOUNTER — Encounter: Payer: Self-pay | Admitting: Physical Medicine & Rehabilitation

## 2022-10-30 ENCOUNTER — Other Ambulatory Visit (HOSPITAL_BASED_OUTPATIENT_CLINIC_OR_DEPARTMENT_OTHER): Payer: Self-pay

## 2022-10-30 DIAGNOSIS — M47816 Spondylosis without myelopathy or radiculopathy, lumbar region: Secondary | ICD-10-CM

## 2022-10-31 ENCOUNTER — Encounter: Payer: Self-pay | Admitting: Physical Medicine & Rehabilitation

## 2022-10-31 ENCOUNTER — Other Ambulatory Visit (HOSPITAL_BASED_OUTPATIENT_CLINIC_OR_DEPARTMENT_OTHER): Payer: Self-pay

## 2022-10-31 ENCOUNTER — Encounter
Payer: Commercial Managed Care - PPO | Attending: Physical Medicine & Rehabilitation | Admitting: Physical Medicine & Rehabilitation

## 2022-10-31 ENCOUNTER — Other Ambulatory Visit (HOSPITAL_COMMUNITY): Payer: Self-pay

## 2022-10-31 DIAGNOSIS — M5416 Radiculopathy, lumbar region: Secondary | ICD-10-CM | POA: Insufficient documentation

## 2022-10-31 MED ORDER — MORPHINE SULFATE ER 30 MG PO TBCR
30.0000 mg | EXTENDED_RELEASE_TABLET | Freq: Two times a day (BID) | ORAL | 0 refills | Status: AC
Start: 2022-10-31 — End: ?
  Filled 2022-10-31: qty 60, 30d supply, fill #0

## 2022-10-31 NOTE — Progress Notes (Signed)
Pt had last dose of xarelto Wednessday evening 10/29/22 . THis is <48 hr.   Per ASRA guideline Xarelto must be held at least 3 d or 65hrs .  Pt will need to be rescheduled and Xarelto held for a minimum of 65hrs

## 2022-10-31 NOTE — Patient Instructions (Addendum)
Must hold Xarelto for a minimum of 65hrs prior to procedure

## 2022-11-01 ENCOUNTER — Other Ambulatory Visit (HOSPITAL_BASED_OUTPATIENT_CLINIC_OR_DEPARTMENT_OTHER): Payer: Self-pay

## 2022-11-05 DIAGNOSIS — F331 Major depressive disorder, recurrent, moderate: Secondary | ICD-10-CM | POA: Diagnosis not present

## 2022-11-05 DIAGNOSIS — F411 Generalized anxiety disorder: Secondary | ICD-10-CM | POA: Diagnosis not present

## 2022-11-06 ENCOUNTER — Encounter
Payer: Commercial Managed Care - PPO | Attending: Physical Medicine & Rehabilitation | Admitting: Physical Medicine & Rehabilitation

## 2022-11-06 ENCOUNTER — Encounter: Payer: Self-pay | Admitting: Physical Medicine & Rehabilitation

## 2022-11-06 ENCOUNTER — Other Ambulatory Visit (HOSPITAL_BASED_OUTPATIENT_CLINIC_OR_DEPARTMENT_OTHER): Payer: Self-pay

## 2022-11-06 VITALS — BP 130/85 | HR 100 | Temp 98.5°F | Ht 67.5 in | Wt 201.0 lb

## 2022-11-06 DIAGNOSIS — M5416 Radiculopathy, lumbar region: Secondary | ICD-10-CM | POA: Diagnosis not present

## 2022-11-06 MED ORDER — DEXAMETHASONE SODIUM PHOSPHATE 10 MG/ML IJ SOLN
10.0000 mg | Freq: Once | INTRAMUSCULAR | Status: DC
Start: 2022-11-06 — End: 2022-11-06

## 2022-11-06 MED ORDER — BETAMETHASONE SOD PHOS & ACET 6 (3-3) MG/ML IJ SUSP
12.0000 mg | Freq: Once | INTRAMUSCULAR | Status: AC
Start: 2022-11-06 — End: 2022-11-06
  Administered 2022-11-06: 12 mg via INTRAMUSCULAR

## 2022-11-06 MED ORDER — IOHEXOL 180 MG/ML  SOLN
3.0000 mL | Freq: Once | INTRAMUSCULAR | Status: AC
  Administered 2022-11-06: 3 mL

## 2022-11-06 NOTE — Patient Instructions (Signed)
You received an epidural steroid injection under fluoroscopic guidance. This is the most accurate way to perform an epidural injection. This injection was performed to relieve thigh or leg or foot pain that may be related to a pinched nerve in the lumbar spine. The local anesthetic injected today may cause numbness in your leg for a couple hours. If it is severe we may need to observe you for 30-60 minutes after the injection. The cortisone medicine injected today may take several days to take full effect. This medicine can also cause facial flushing or feeling of being warm.  This injection may last for days weeks or months. It can be repeated if needed. If it is not effective, another spinal level may need to be injected. Other treatments include medication management as well as physical therapy. In some cases surgery may be an option.   May resume Xarelto on 11/07/22

## 2022-11-06 NOTE — Progress Notes (Signed)
  PROCEDURE RECORD [facility]   Name: Jesse Bowers DOB:Jul 02, 1965 MRN: [id number]  Date:11/06/2022  Physician: Claudette Laws, MD    Nurse/CMA: Nedra Hai, CMA  Allergies:  Allergies  Allergen Reactions   Gabapentin Nausea Only and Other (See Comments)    Other reaction(s): nausea, mental status Drowsiness and restlessness. Pt. States, " It makes me crazy, I can't take this medicine." Other reaction(s): Other (See Comments) Tardive dyskinesia   Penicillin G Anaphylaxis and Other (See Comments)    Has patient had a PCN reaction causing immediate rash, facial/tongue/throat swelling, SOB or lightheadedness with hypotension: Yes Has patient had a PCN reaction causing severe rash involving mucus membranes or skin necrosis: No Has patient had a PCN reaction that required hospitalization Yes Has patient had a PCN reaction occurring within the last 10 years: No If all of the above answers are "NO", then may proceed with Cephalosporin use.    Sulfa Antibiotics Rash    Stevens-Johnson rash   Nortriptyline Other (See Comments)    Dry mouth at 25 mg dose.  Tolerates 10 mg dose Other reaction(s): Other (See Comments) Dry Mouth   Pregabalin Other (See Comments)    Ineffective Other reaction(s): Other (See Comments) Tardive dyskinesia   Tegaderm Ag Mesh [Silver] Dermatitis    Causes blistering wounds    Ibuprofen Other (See Comments)    Contraindicated with Xarelto.    Sulfacetamide Sodium-Sulfur Rash    Consent Signed: Yes.    Is patient diabetic? No.  CBG today? .  Pregnant: No. LMP: No LMP recorded. (Menstrual status: Other). (age 53-55)  Anticoagulants: yes (Xarelto, stopped Sunday night) Anti-inflammatory: no Antibiotics: no  Procedure: L5-S1 Translaminar Epidural Steroid Injection  Position: Prone Start Time: 12:40 pm  End Time: 12:44 pm  Fluoro Time: 33  RN/CMA Nedra Hai, CMA Jesse Bowers, CMA    Time 12:19 pm 12:49 pm    BP 130/95 107/74     Pulse 100 101    Respirations 16 16    O2 Sat 94 93    S/S 6 6    Pain Level 7/10 5/10     D/C home with uber/wife, patient A & O X 3, D/C instructions reviewed, and sits independently.

## 2022-11-06 NOTE — Progress Notes (Signed)
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   Last xarelto dose 11/02/2022  Informed consent was obtained after describing risk and benefits of the procedure with the patient, this includes bleeding, bruising, infection, paralysis and medication side effects.  The patient wishes to proceed and has given written consent.  Patient was placed in a prone position.  The lumbar area was marked and prepped with Betadine.  It was entered with a 25-gauge 1-1/2 inch needle and one mL of 1% lidocaine was injected into the skin and subcutaneous tissue.  Then a 17-gauge spinal needle was inserted under fluoroscopic guidance into the Left L5-S1 interlaminar space under AP and Lateral imaging.  Once needle tip of approximated the posterior elements, a loss of resistance technique was utilized with lateral imaging.  A positive loss of resistance was obtained and then confirmed by injecting 2 mL's of Isovue 200.  Then a solution containing 1.5 mL's of 6mg /ml Celestone and 1.5 mL's of 1% lidocaine was injected.  The patient tolerated procedure well.  Post procedure instructions were given.  Please see post procedure form. May resume Xarelto 11/07/2022 after Noon

## 2022-11-07 ENCOUNTER — Other Ambulatory Visit (HOSPITAL_BASED_OUTPATIENT_CLINIC_OR_DEPARTMENT_OTHER): Payer: Self-pay

## 2022-11-07 ENCOUNTER — Other Ambulatory Visit: Payer: Self-pay

## 2022-11-07 DIAGNOSIS — F411 Generalized anxiety disorder: Secondary | ICD-10-CM | POA: Diagnosis not present

## 2022-11-07 DIAGNOSIS — F331 Major depressive disorder, recurrent, moderate: Secondary | ICD-10-CM | POA: Diagnosis not present

## 2022-11-10 ENCOUNTER — Other Ambulatory Visit (HOSPITAL_BASED_OUTPATIENT_CLINIC_OR_DEPARTMENT_OTHER): Payer: Self-pay

## 2022-11-10 DIAGNOSIS — F411 Generalized anxiety disorder: Secondary | ICD-10-CM | POA: Diagnosis not present

## 2022-11-10 DIAGNOSIS — F4310 Post-traumatic stress disorder, unspecified: Secondary | ICD-10-CM | POA: Diagnosis not present

## 2022-11-10 DIAGNOSIS — F09 Unspecified mental disorder due to known physiological condition: Secondary | ICD-10-CM | POA: Diagnosis not present

## 2022-11-10 DIAGNOSIS — F5101 Primary insomnia: Secondary | ICD-10-CM | POA: Diagnosis not present

## 2022-11-10 DIAGNOSIS — F32A Depression, unspecified: Secondary | ICD-10-CM | POA: Diagnosis not present

## 2022-11-10 MED ORDER — DESVENLAFAXINE SUCCINATE ER 50 MG PO TB24
50.0000 mg | ORAL_TABLET | Freq: Every day | ORAL | 1 refills | Status: DC
  Filled 2022-11-10: qty 90, 90d supply, fill #0

## 2022-11-10 MED ORDER — HYDROXYZINE HCL 25 MG PO TABS
25.0000 mg | ORAL_TABLET | Freq: Four times a day (QID) | ORAL | 1 refills | Status: DC | PRN
  Filled 2022-11-10: qty 360, 90d supply, fill #0

## 2022-11-10 MED ORDER — DESVENLAFAXINE SUCCINATE ER 100 MG PO TB24
100.0000 mg | ORAL_TABLET | Freq: Every day | ORAL | 1 refills | Status: DC
  Filled 2022-11-10: qty 90, 90d supply, fill #0

## 2022-11-10 MED ORDER — VITAMIN D (ERGOCALCIFEROL) 1.25 MG (50000 UNIT) PO CAPS
50000.0000 [IU] | ORAL_CAPSULE | ORAL | 0 refills | Status: DC
  Filled 2022-11-10: qty 12, 84d supply, fill #0

## 2022-11-11 ENCOUNTER — Other Ambulatory Visit (HOSPITAL_BASED_OUTPATIENT_CLINIC_OR_DEPARTMENT_OTHER): Payer: Self-pay

## 2022-11-11 DIAGNOSIS — K581 Irritable bowel syndrome with constipation: Secondary | ICD-10-CM | POA: Diagnosis not present

## 2022-11-11 DIAGNOSIS — F411 Generalized anxiety disorder: Secondary | ICD-10-CM | POA: Diagnosis not present

## 2022-11-11 DIAGNOSIS — K5902 Outlet dysfunction constipation: Secondary | ICD-10-CM | POA: Diagnosis not present

## 2022-11-11 DIAGNOSIS — F331 Major depressive disorder, recurrent, moderate: Secondary | ICD-10-CM | POA: Diagnosis not present

## 2022-11-11 MED ORDER — URSODIOL 500 MG PO TABS
500.0000 mg | ORAL_TABLET | Freq: Two times a day (BID) | ORAL | 2 refills | Status: DC
  Filled 2022-11-11: qty 180, 90d supply, fill #0

## 2022-11-12 ENCOUNTER — Other Ambulatory Visit (HOSPITAL_BASED_OUTPATIENT_CLINIC_OR_DEPARTMENT_OTHER): Payer: Self-pay

## 2022-11-12 ENCOUNTER — Encounter (HOSPITAL_BASED_OUTPATIENT_CLINIC_OR_DEPARTMENT_OTHER): Payer: Self-pay

## 2022-11-12 ENCOUNTER — Encounter: Payer: Self-pay | Admitting: Family Medicine

## 2022-11-12 DIAGNOSIS — H538 Other visual disturbances: Secondary | ICD-10-CM | POA: Diagnosis not present

## 2022-11-12 DIAGNOSIS — H35413 Lattice degeneration of retina, bilateral: Secondary | ICD-10-CM | POA: Diagnosis not present

## 2022-11-12 DIAGNOSIS — K900 Celiac disease: Secondary | ICD-10-CM | POA: Diagnosis not present

## 2022-11-12 DIAGNOSIS — H353131 Nonexudative age-related macular degeneration, bilateral, early dry stage: Secondary | ICD-10-CM | POA: Diagnosis not present

## 2022-11-12 DIAGNOSIS — H04123 Dry eye syndrome of bilateral lacrimal glands: Secondary | ICD-10-CM | POA: Diagnosis not present

## 2022-11-12 DIAGNOSIS — Z961 Presence of intraocular lens: Secondary | ICD-10-CM | POA: Diagnosis not present

## 2022-11-12 DIAGNOSIS — K8689 Other specified diseases of pancreas: Secondary | ICD-10-CM | POA: Diagnosis not present

## 2022-11-12 MED ORDER — RABEPRAZOLE SODIUM 20 MG PO TBEC
DELAYED_RELEASE_TABLET | ORAL | 1 refills | Status: DC
Start: 2022-11-12 — End: 2022-11-26
  Filled 2022-11-12: qty 180, 90d supply, fill #0

## 2022-11-13 ENCOUNTER — Other Ambulatory Visit (HOSPITAL_BASED_OUTPATIENT_CLINIC_OR_DEPARTMENT_OTHER): Payer: Self-pay | Admitting: Family

## 2022-11-13 ENCOUNTER — Other Ambulatory Visit (HOSPITAL_COMMUNITY): Payer: Self-pay

## 2022-11-14 ENCOUNTER — Other Ambulatory Visit (HOSPITAL_BASED_OUTPATIENT_CLINIC_OR_DEPARTMENT_OTHER): Payer: Self-pay

## 2022-11-14 DIAGNOSIS — F411 Generalized anxiety disorder: Secondary | ICD-10-CM | POA: Diagnosis not present

## 2022-11-14 DIAGNOSIS — K8689 Other specified diseases of pancreas: Secondary | ICD-10-CM | POA: Diagnosis not present

## 2022-11-14 DIAGNOSIS — F331 Major depressive disorder, recurrent, moderate: Secondary | ICD-10-CM | POA: Diagnosis not present

## 2022-11-14 MED ORDER — POTASSIUM CHLORIDE ER 10 MEQ PO TBCR
20.0000 meq | EXTENDED_RELEASE_TABLET | Freq: Every day | ORAL | 0 refills | Status: DC
  Filled 2022-11-14: qty 180, 90d supply, fill #0

## 2022-11-15 ENCOUNTER — Other Ambulatory Visit (HOSPITAL_BASED_OUTPATIENT_CLINIC_OR_DEPARTMENT_OTHER): Payer: Self-pay

## 2022-11-18 ENCOUNTER — Other Ambulatory Visit (HOSPITAL_BASED_OUTPATIENT_CLINIC_OR_DEPARTMENT_OTHER): Payer: Self-pay

## 2022-11-18 DIAGNOSIS — G4731 Primary central sleep apnea: Secondary | ICD-10-CM | POA: Diagnosis not present

## 2022-11-18 DIAGNOSIS — G9332 Myalgic encephalomyelitis/chronic fatigue syndrome: Secondary | ICD-10-CM | POA: Diagnosis not present

## 2022-11-18 DIAGNOSIS — D751 Secondary polycythemia: Secondary | ICD-10-CM | POA: Diagnosis not present

## 2022-11-18 DIAGNOSIS — Z8673 Personal history of transient ischemic attack (TIA), and cerebral infarction without residual deficits: Secondary | ICD-10-CM | POA: Diagnosis not present

## 2022-11-18 DIAGNOSIS — G894 Chronic pain syndrome: Secondary | ICD-10-CM | POA: Diagnosis not present

## 2022-11-18 DIAGNOSIS — F1190 Opioid use, unspecified, uncomplicated: Secondary | ICD-10-CM | POA: Diagnosis not present

## 2022-11-19 DIAGNOSIS — Z79899 Other long term (current) drug therapy: Secondary | ICD-10-CM | POA: Diagnosis not present

## 2022-11-19 DIAGNOSIS — F411 Generalized anxiety disorder: Secondary | ICD-10-CM | POA: Diagnosis not present

## 2022-11-19 DIAGNOSIS — F331 Major depressive disorder, recurrent, moderate: Secondary | ICD-10-CM | POA: Diagnosis not present

## 2022-11-20 DIAGNOSIS — H608X3 Other otitis externa, bilateral: Secondary | ICD-10-CM | POA: Diagnosis not present

## 2022-11-20 DIAGNOSIS — H903 Sensorineural hearing loss, bilateral: Secondary | ICD-10-CM | POA: Diagnosis not present

## 2022-11-21 ENCOUNTER — Encounter: Payer: Self-pay | Admitting: Cardiovascular Disease

## 2022-11-21 ENCOUNTER — Ambulatory Visit: Payer: Commercial Managed Care - PPO | Attending: Cardiovascular Disease | Admitting: Cardiovascular Disease

## 2022-11-21 VITALS — BP 118/78 | HR 75 | Ht 67.75 in | Wt 204.2 lb

## 2022-11-21 DIAGNOSIS — I5032 Chronic diastolic (congestive) heart failure: Secondary | ICD-10-CM

## 2022-11-21 DIAGNOSIS — D6859 Other primary thrombophilia: Secondary | ICD-10-CM | POA: Diagnosis not present

## 2022-11-21 DIAGNOSIS — R002 Palpitations: Secondary | ICD-10-CM | POA: Diagnosis not present

## 2022-11-21 DIAGNOSIS — Z8669 Personal history of other diseases of the nervous system and sense organs: Secondary | ICD-10-CM | POA: Diagnosis not present

## 2022-11-21 DIAGNOSIS — F331 Major depressive disorder, recurrent, moderate: Secondary | ICD-10-CM | POA: Diagnosis not present

## 2022-11-21 DIAGNOSIS — F411 Generalized anxiety disorder: Secondary | ICD-10-CM | POA: Diagnosis not present

## 2022-11-21 NOTE — Progress Notes (Addendum)
Cardiology Office Note:    Date:  11/21/2022   ID:  Jesse Bowers, DOB 1965/07/11, MRN [id number]  PCP:  Karie Georges, MD  Cardiologist:  Thurmon Fair, MD   Referring MD: Karie Georges, MD   No chief complaint on file.    History of Present Illness:    Jesse Bowers is a 57 y.o. adult with a hx of chronic diastolic heart failure, obstructive sleep apnea on CPAP, history of previous stroke on chronic anticoagulation with Xarelto (protein C deficiency) returning for routine follow-up.   He is doing well from a cardiovascular point of view.  He occasionally takes an extra dose of diuretic for lower extremity edema, not more often than once or twice a month. On his home scale he typically weighs less than 200 pounds; on his home scale his weight is roughly 3 pounds or lower than on our office scale.  He is walking a lot more since his electrical scooter is broken and this has may had a positive impact on his osteopenia.  He does not have angina at rest or with activity.  He does not have frequent issues with orthopnea, PND, lower extremity edema, palpitations, dizziness or syncope.  He has some difficulty with depth perception, especially when walking downstairs and this has led to some falls or near misses.  He has not had serious injuries.  He has not had bleeding events and continues to take Xarelto.  Just over the last 3 days he has had some relatively discomfort in bilateral anterior lower chest,  clearly worsened by deep breathing.  He has not had cough, fever, chills or hemoptysis.  He is not currently using CPAP. Transferred to [facility] for sleep mgmt, having a split-night sleep study next month.  Continues on Cosentyx and methotrexate for psoriasis, with good results.    Metabolic parameters are pretty good with a total cholesterol 113 and LDL of 60 although his HDL remains chronically low at about 41.  Triglycerides are normal.  Renal function parameters are  normal.   ROS:   [name] described these complaints today:  + dizziness: I've been dizzy since pleurisy pain started & worse when deep breathing  +sleep: sleeps with 2 pillows, at times 3, bcuz I can't breathe lying flat & it's uncomfortable with arthritis pain too.  +LE Edema: already addressed as yes in evenings sometimes take an evening 20mg  lasix  bcuz swelling.  + Palpitations: using med for palpitations once every couple of weeks +PND: Tingling and numbness still there. The past two weeks, it's worse in my right foot. Can't feel toes. It's neck & spine related not heart related.     His last echo was 04/27/2020. He has preserved left ventricular systolic function (EF>60%, GLS -24%), but with some echo evidence of diastolic dysfunction (mildly decreased e' for age, E/A 0.7).   He has complex obstructive and central sleep apnea, with mild daytime hypersomnolence, using his BiPAP. On MTX and Cosentyx for psoriatic arthritis.  He underwent gender affirmation surgery in December 2022, on chronic androgen therapy (transition began around the age of 64).  His daughter is having a successful time in college at [employer] in [place], where she swims competitively.  Past Medical History:  Diagnosis Date   Abnormal weight loss    Anxiety    Arthritis    Cataract    OU   Celiac disease    Cervical neck pain with evidence of disc disease  patient has a cyst    Chronic constipation    Chronic diastolic heart failure (HCC)    Pt. 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   Family history of adverse reaction to anesthesia    family has problems with anesthesia of nausea and vomiting    GERD (gastroesophageal reflux disease)    History of in 20's    Gluten enteropathy    H/O parotitis    right    Hard of hearing    History of kidney stones    History of retinal tear    Bilateral   History of staph infection    required wound vac   Hx-TIA (transient ischemic attack)    2015   Kidney stones 08/2020   LVH (left ventricular hypertrophy) 12/15/2016   Mild, noted on ECHO   MVP (mitral valve prolapse)    NAFL (nonalcoholic fatty liver)    Neck pain    Neuromuscular disorder (HCC)    bilateral neuropathy feet.   Nuclear sclerotic cataract of both eyes 09/08/2019   Pneumonia 12/17/2010   Polycythemia    Polycythemia, secondary    PONV (postoperative nausea and vomiting)    Protein C deficiency (HCC)    Dr. Thomes Cake   Psoriasis    16 X10 cm psoriatic rash on sole of left foot ; open and occ scant bleeding;    psoriatic arthritis    PTSD (post-traumatic stress disorder)    Scaphoid fracture of wrist 09/23/2013   Seizure (HCC)    childhood, medication until age 57 then weanned completely off   Sleep apnea    split night study last done by Dr. Epimenio Foot 06/18/15 shows severe OSA, CSA, and hypersomnia, rec bipap   Splenomegaly    Stenosis of ureteropelvic junction (UPJ)    left   Stroke [facility])    CVA vs TIA in left cerebrum causing slight right sided weakness-Dr. 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    Wears glasses    Wears hearing aid     Past Surgical History:  Procedure Laterality Date   ABDOMINAL HYSTERECTOMY Bilateral 1994   TAH, BSO- tranverse incision at 57 yo   ANKLE ARTHROSCOPY WITH RECONSTRUCTION Right 2007   CHOLECYSTECTOMY     laparoscopic   COLONOSCOPY     x3   EYE SURGERY     Left eye 03/02/2018, right 02/15/2018   HEMORROIDECTOMY  08/19/2020   HIP ARTHROSCOPY W/ LABRAL REPAIR Right 05/11/2013    acetabular labral tear 03/30/2013   KNEE ARTHROPLASTY Right    KNEE JOINT MANIPULATION Left    x3 under anesthesia   KNEE SURGERY Bilateral 1984   Right ACL, left PCL repair   LITHOTRIPSY  2005   LIVER BIOPSY  2013   normal results.   MASTECTOMY Bilateral    prior to 2009   MOUTH SURGERY     NASAL SEPTUM SURGERY N/A 09/20/2015   by ENT Dr. Ezzard Standing  OVARIAN CYST SURGERY Left    size of grapefruit, was informed that she had shortened vagina   SHOULDER SURGERY Bilateral    Right 08/15/2016, Left 11/15/2016   THUMB ARTHROSCOPY Left    THYROIDECTOMY, PARTIAL Left 2008   TOTAL KNEE ARTHROPLASTY Right 08/23/2018   Procedure: TOTAL KNEE ARTHROPLASTY;  Surgeon: Ollen Gross, MD;  Location: [facility];  Service: Orthopedics;  Laterality: Right;    TOTAL KNEE REVISION Left 02/06/2016   Procedure: LEFT TOTAL KNEE REVISION;  Surgeon: Ollen Gross, MD;  Location: [facility];  Service: Orthopedics;  Laterality: Left;   TOTAL KNEE REVISION Left 04/22/2017   Procedure: Left knee polyethylene revision;  Surgeon: Ollen Gross, MD;  Location: [facility];  Service: Orthopedics;  Laterality: Left;   UPPER GI ENDOSCOPY  2003    Current Medications: Current Meds  Medication Sig   acyclovir ointment (ZOVIRAX) 5 % Apply topically every 3 (three) hours. For acute breakout of cold sores for 7 days.   baclofen (LIORESAL) 20 MG tablet Take 1-2 tablets (20-40 mg total) by mouth 4 (four) times daily. 1 tablet with breakfast, lunch, and dinner and 2 tablets at bedtime   calcium carbonate (OS-CAL) 1250 (500 Ca) MG chewable tablet Chew 1 tablet by mouth daily.   Clobetasol Propionate Emulsion 0.05 % topical foam Apply a thin layer to the affected area(s) on the scalp   desonide (DESOWEN) 0.05 % cream Apply to affected area(s) twice daily   desvenlafaxine (PRISTIQ) 100 MG 24 hr tablet Take 1 tablet (100 mg total) by mouth daily. (total dose 150mg )   folic acid (KP FOLIC ACID) 1 MG tablet Take 1 tablet po every day    furosemide (LASIX) 20 MG tablet Take 1 tablet (20 mg total) by mouth daily as needed for edema.   furosemide (LASIX) 80 MG tablet Take 1 tablet (80 mg total) by mouth daily.   hydrOXYzine (ATARAX) 25 MG tablet Take 1 tablet (25 mg total) by mouth 4 (four) times daily as needed for anxiety   metFORMIN (GLUCOPHAGE) 500 MG tablet Take 1 tablet (500 mg total) by mouth 2 (two) times daily with a meal.   methotrexate (RHEUMATREX) 2.5 MG tablet Take 6 tablets (15 mg total) by mouth once a week.   morphine (MS CONTIN) 30 MG 12 hr tablet Take 1 tablet (30 mg total) by mouth every 12 (twelve) hours.   mupirocin ointment (BACTROBAN) 2 % Apply a small amount to affected area of nose twice daily for 14 days.   OVER THE COUNTER MEDICATION Tums-1000mg  once a day   Oxycodone HCl 10 MG TABS Take 1 tablet (10 mg total) by mouth every 6 (six) hours as needed (pain).   Pancrelipase, Lip-Prot-Amyl, (CREON) 24000-76000 units CPEP Take 2 capsules by mouth in the morning and 2 capsules at noon and 2 capsules in the evening. Take with meals.   pantoprazole (PROTONIX) 40 MG tablet Take 1 tablet (40 mg total) by mouth every morning before breakfast.   phentermine 15 MG capsule Take 1 capsule (15 mg total) by mouth every morning.   potassium chloride (KLOR-CON) 10 MEQ tablet Take 2 tablets by mouth every day.   RABEprazole (ACIPHEX) 20 MG tablet Take 1 tablet (20 mg total) by mouth in the morning AND 1 tablet (20 mg total) every evening. Take before meals.   rivaroxaban (XARELTO) 20 MG TABS tablet Take 1 tablet (20 mg total) by mouth Nightly.   Secukinumab, 300 MG Dose, (COSENTYX SENSOREADY, 300 MG,) 150 MG/ML SOAJ  Inject 2(two) pens subcutaneous every 4 weeks   Tenapanor HCl (IBSRELA) 50 MG TABS Take 50 mg by mouth 2 (two) times daily.   Testosterone 25 MG/2.5GM (1%) GEL Apply a pea-sized amount topically to desired area once daily.   testosterone cypionate (DEPOTESTOSTERONE CYPIONATE) 200 MG/ML injection Inject 0.5 mLs (100  mg total) into the muscle once a week.   ursodiol (ACTIGALL) 500 MG tablet Take 1 tablet (500 mg total) by mouth 2 (two) times daily. Take with 250 mg tablet for total of 750 mg daily at lunchtime and at dinnertime.   Vitamin D, Ergocalciferol, (DRISDOL) 1.25 MG (50000 UNIT) CAPS capsule Take 1 capsule (50,000 Units total) by mouth once a week.   [DISCONTINUED] desvenlafaxine (PRISTIQ) 100 MG 24 hr tablet Take 1 tablet (100 mg total) by mouth daily (total dose 150mg )   [DISCONTINUED] phentermine 15 MG capsule Take 1 capsule (15 mg total) by mouth every morning.   [DISCONTINUED] Secukinumab (COSENTYX SENSOREADY PEN) 150 MG/ML SOAJ Inject 300 mg into the skin. Inject two   [DISCONTINUED] topiramate (TOPAMAX) 50 MG tablet Take 0.5 tablets (25 mg total) by mouth at bedtime for 7 days, THEN 1 tablet (50 mg total) at bedtime for 7 days, THEN 1 tablet (50 mg total) 2 (two) times daily.   [DISCONTINUED] ursodiol (ACTIGALL) 500 MG tablet Take 1 tablet (500 mg total) by mouth 2 (two) times daily.     Allergies:   Gabapentin, Penicillin g, Sulfa antibiotics, Nortriptyline, Pregabalin, Tegaderm ag mesh [silver], Ibuprofen, and Sulfacetamide sodium-sulfur   Social History   Socioeconomic History   Marital status: Married    Spouse name: Not on file   Number of children: 2   Years of education: 4y college   Highest education level: Master's degree (e.g., MA, MS, MEng, MEd, MSW, MBA)  Occupational History   Occupation: Restaurant manager, fast food    Comment: Not working since CVA 2015  Tobacco Use   Smoking status: Never   Smokeless tobacco: Never  Vaping Use   Vaping status: Never Used  Substance and Sexual Activity   Alcohol use: Yes    Comment: social   Drug use: No   Sexual activity: Yes    Birth control/protection: None    Comment: patient is a transgender on testosterone shots, no biological kids  Other Topics Concern   Not on file  Social History Narrative   Not on file   Social  Determinants of Health   Financial Resource Strain: Low Risk  (06/08/2022)   Overall Financial Resource Strain (CARDIA)    Difficulty of Paying Living Expenses: Not hard at all  Food Insecurity: Low Risk  (11/20/2022)   Received from [facility]   Hunger Vital Sign    Worried About Running Out of Food in the Last Year: Never true    Ran Out of Food in the Last Year: Never true  Transportation Needs: No Transportation Needs (11/20/2022)   Received from [facility]    In the past 12 months, has lack of reliable transportation kept you from medical appointments, meetings, work or from getting things needed for daily living? : No  Physical Activity: Unknown (06/08/2022)   Exercise Vital Sign    Days of Exercise per Week: 0 days    Minutes of Exercise per Session: Not on file  Stress: Stress Concern Present (06/08/2022)   [employer] of Occupational Health - Occupational Stress Questionnaire    Feeling of Stress : Rather much  Social Connections:  Socially Isolated (06/08/2022)   Social Connection and Isolation Panel [NHANES]    Frequency of Communication with Friends and Family: Once a week    Frequency of Social Gatherings with Friends and Family: Never    Attends Religious Services: Never    Diplomatic Services operational officer: No    Attends Engineer, structural: Not on file    Marital Status: Married     Family History: The patient's family history includes Breast cancer in his sister; Breast cancer (age of onset: 46) in his maternal aunt; Cancer in his paternal grandfather; Congestive Heart Failure in his maternal grandmother; Glaucoma in his maternal grandfather and mother; Heart attack in his maternal grandfather, maternal grandmother, and paternal grandfather; Hypertension in his mother; Macular degeneration in his maternal grandfather; Other in his mother; Polycythemia in his paternal uncle; Protein C deficiency (age of onset: 1) in his sister;  Psoriasis in his mother; Stroke in his maternal grandfather, maternal grandmother, and paternal uncle.  ROS:   Please see the history of present illness.    All other systems are reviewed and are negative  EKGs/Labs/Other Studies Reviewed:    EKG: EKG is ordered today and personally reviewed.  NSR, normal tracing.  Recent Labs: 06/12/2022: Hemoglobin 15.5; Platelets 225.0  Recent Lipid Panel    Component Value Date/Time   CHOL 113 04/09/2022 1010   CHOL 142 01/05/2018 1509   TRIG 61.0 04/09/2022 1010   HDL 40.60 04/09/2022 1010   HDL 52 01/05/2018 1509   CHOLHDL 3 04/09/2022 1010   VLDL 12.2 04/09/2022 1010   LDLCALC 60 04/09/2022 1010   LDLCALC 81 01/05/2018 1509   05/01/2021  cholesterol 154, HDL 54, LDL 86, triglycerides 70.  11/05/2021 Hemoglobin 15.3, creatinine 1.04, potassium 4.5, ALT 36, TSH 2.20  Most recent hemoglobin A1c was 5.4% (August 2022).  Physical Exam:    VS:  BP 118/78 (BP Location: Left Arm, Patient Position: Sitting, Cuff Size: Large)   Pulse 75   Ht 5' 7.75" (1.721 m)   Wt 204 lb 3.2 oz (92.6 kg)   SpO2 98%   BMI 31.28 kg/m     Wt Readings from Last 3 Encounters:  11/21/22 204 lb 3.2 oz (92.6 kg)  11/06/22 201 lb (91.2 kg)  09/29/22 202 lb 9.6 oz (91.9 kg)     General: Alert, oriented x3, no distress, mildly obese Head: no evidence of trauma, PERRL, EOMI, no exophtalmos or lid lag, no myxedema, no xanthelasma; normal ears, nose and oropharynx Neck: normal jugular venous pulsations and no hepatojugular reflux; brisk carotid pulses without delay and no carotid bruits Chest: clear to auscultation, no signs of consolidation by percussion or palpation, normal fremitus, symmetrical and full respiratory excursions Cardiovascular: normal position and quality of the apical impulse, regular rhythm, normal first and second heart sounds, no murmurs, rubs or gallops Abdomen: no tenderness or distention, no masses by palpation, no abnormal pulsatility or  arterial bruits, normal bowel sounds, no hepatosplenomegaly Extremities: no clubbing, cyanosis or edema; 2+ radial, ulnar and brachial pulses bilaterally; 2+ right femoral, posterior tibial and dorsalis pedis pulses; 2+ left femoral, posterior tibial and dorsalis pedis pulses; no subclavian or femoral bruits Neurological: grossly nonfocal Psych: Normal mood and affect     ASSESSMENT:    1. Chronic diastolic heart failure (HCC)   2. History of sleep apnea   3. Protein C deficiency (HCC)   4. Palpitations       PLAN:    In order of problems  listed above:  CHF: Diastolic dysfunction, he rarely has need for extra doses of loop diuretic. OSA: Transferred to [facility] for sleep mgmt, having a split-night sleep study next month.  Has lost weight since his peak weight in the past, but is still mildly obese. Protein C deficiency: On chronic Xarelto without bleeding problems. Reported history of stroke. MRI stable 2016-2017-2019: "tiny punctate periventricular and subcortical white matter hyperintensities likely due to chronic microvascular ischemia", "Scattered chronic cerebral white matter signal abnormality is nonspecific but compatible with sequelae of hypercoagulable state".  He does not have a history of atrial fibrillation.  Palpitations: Symptoms well-controlled on a very small dose of metoprolol succinate.  Most recent monitor showed only sinus tachycardia, previous monitor showed occasional isolated PACs and PVCs.  Atrial fibrillation has never been documented.   Chest discomfort: Sounds pleuritic but is rather mild and is not associated with other worsening complaints.  He is on Xarelto and does not have any signs or symptoms of a lower extremity DVT.  He has not had fever or chills.  Possible viral syndrome.  He plans to discuss with his PCP.  Medication Adjustments/Labs and Tests Ordered: Current medicines are reviewed at length with the patient today.  Concerns regarding medicines  are outlined above.  Orders Placed This Encounter  Procedures   EKG 12-Lead    No orders of the defined types were placed in this encounter.    Patient Instructions  Medication Instructions:  No changes *If you need a refill on your cardiac medications before your next appointment, please call your pharmacy*   Follow-Up: At [facility], you and your health needs are our priority.  As part of our continuing mission to provide you with exceptional heart care, we have created designated Provider Care Teams.  These Care Teams include your primary Cardiologist (physician) and Advanced Practice Providers (APPs -  Physician Assistants and Nurse Practitioners) who all work together to provide you with the care you need, when you need it.  We recommend signing up for the patient portal called "MyChart".  Sign up information is provided on this After Visit Summary.  MyChart is used to connect with patients for Virtual Visits (Telemedicine).  Patients are able to view lab/test results, encounter notes, upcoming appointments, etc.  Non-urgent messages can be sent to your provider as well.   To learn more about what you can do with MyChart, go to [email or web address].    Your next appointment:   1 year(s)  Provider:   Thurmon Fair, MD       Signed, Thurmon Fair, MD  11/21/2022 8:33 AM    [facility]

## 2022-11-21 NOTE — Patient Instructions (Signed)
Medication Instructions:  No changes *If you need a refill on your cardiac medications before your next appointment, please call your pharmacy*  Follow-Up: At [facility], you and your health needs are our priority.  As part of our continuing mission to provide you with exceptional heart care, we have created designated Provider Care Teams.  These Care Teams include your primary Cardiologist (physician) and Advanced Practice Providers (APPs -  Physician Assistants and Nurse Practitioners) who all work together to provide you with the care you need, when you need it.  We recommend signing up for the patient portal called "MyChart".  Sign up information is provided on this After Visit Summary.  MyChart is used to connect with patients for Virtual Visits (Telemedicine).  Patients are able to view lab/test results, encounter notes, upcoming appointments, etc.  Non-urgent messages can be sent to your provider as well.   To learn more about what you can do with MyChart, go to [email or web address].    Your next appointment:   1 year(s)  Provider:   Thurmon Fair, MD

## 2022-11-22 ENCOUNTER — Encounter (HOSPITAL_COMMUNITY): Payer: Self-pay

## 2022-11-24 ENCOUNTER — Other Ambulatory Visit (HOSPITAL_BASED_OUTPATIENT_CLINIC_OR_DEPARTMENT_OTHER): Payer: Self-pay

## 2022-11-24 ENCOUNTER — Encounter (INDEPENDENT_AMBULATORY_CARE_PROVIDER_SITE_OTHER): Payer: 59 | Admitting: Ophthalmology

## 2022-11-24 MED ORDER — EYSUVIS 0.25 % OP SUSP
1.0000 [drp] | Freq: Three times a day (TID) | OPHTHALMIC | 0 refills | Status: DC
Start: 2022-11-24 — End: 2022-12-16
  Filled 2022-11-24: qty 8.3, 20d supply, fill #0

## 2022-11-26 ENCOUNTER — Other Ambulatory Visit (HOSPITAL_BASED_OUTPATIENT_CLINIC_OR_DEPARTMENT_OTHER): Payer: Self-pay

## 2022-11-26 ENCOUNTER — Other Ambulatory Visit (HOSPITAL_COMMUNITY): Payer: Self-pay

## 2022-11-26 DIAGNOSIS — F331 Major depressive disorder, recurrent, moderate: Secondary | ICD-10-CM | POA: Diagnosis not present

## 2022-11-26 DIAGNOSIS — F411 Generalized anxiety disorder: Secondary | ICD-10-CM | POA: Diagnosis not present

## 2022-11-26 MED ORDER — RABEPRAZOLE SODIUM 20 MG PO TBEC
DELAYED_RELEASE_TABLET | ORAL | 1 refills | Status: DC
  Filled 2022-11-26: qty 180, 90d supply, fill #0
  Filled 2023-02-07 – 2023-02-09 (×2): qty 180, 90d supply, fill #1

## 2022-11-27 ENCOUNTER — Other Ambulatory Visit (HOSPITAL_BASED_OUTPATIENT_CLINIC_OR_DEPARTMENT_OTHER): Payer: Self-pay

## 2022-11-27 DIAGNOSIS — K581 Irritable bowel syndrome with constipation: Secondary | ICD-10-CM | POA: Diagnosis not present

## 2022-11-27 DIAGNOSIS — K5902 Outlet dysfunction constipation: Secondary | ICD-10-CM | POA: Diagnosis not present

## 2022-11-29 ENCOUNTER — Other Ambulatory Visit: Payer: Self-pay | Admitting: Physical Medicine & Rehabilitation

## 2022-11-29 DIAGNOSIS — M47816 Spondylosis without myelopathy or radiculopathy, lumbar region: Secondary | ICD-10-CM

## 2022-12-01 ENCOUNTER — Other Ambulatory Visit (HOSPITAL_BASED_OUTPATIENT_CLINIC_OR_DEPARTMENT_OTHER): Payer: Self-pay

## 2022-12-01 DIAGNOSIS — K760 Fatty (change of) liver, not elsewhere classified: Secondary | ICD-10-CM | POA: Diagnosis not present

## 2022-12-01 MED ORDER — MORPHINE SULFATE ER 30 MG PO TBCR
30.0000 mg | EXTENDED_RELEASE_TABLET | Freq: Two times a day (BID) | ORAL | 0 refills | Status: DC
Start: 2022-12-01 — End: 2022-12-10
  Filled 2022-12-01: qty 60, 30d supply, fill #0

## 2022-12-01 MED ORDER — LOTEMAX SM 0.38 % OP GEL
1.0000 [drp] | Freq: Three times a day (TID) | OPHTHALMIC | 1 refills | Status: DC
  Filled 2022-12-01: qty 10, 14d supply, fill #0
  Filled 2023-01-20: qty 10, 14d supply, fill #1

## 2022-12-02 ENCOUNTER — Other Ambulatory Visit (HOSPITAL_BASED_OUTPATIENT_CLINIC_OR_DEPARTMENT_OTHER): Payer: Self-pay

## 2022-12-02 ENCOUNTER — Ambulatory Visit: Payer: Commercial Managed Care - PPO | Admitting: Family Medicine

## 2022-12-02 ENCOUNTER — Encounter (HOSPITAL_BASED_OUTPATIENT_CLINIC_OR_DEPARTMENT_OTHER): Payer: Self-pay

## 2022-12-02 ENCOUNTER — Other Ambulatory Visit: Payer: Self-pay

## 2022-12-02 MED ORDER — CIPROFLOXACIN-DEXAMETHASONE 0.3-0.1 % OT SUSP
4.0000 [drp] | Freq: Two times a day (BID) | OTIC | 0 refills | Status: DC
  Filled 2022-12-02: qty 7.5, 19d supply, fill #0

## 2022-12-02 MED ORDER — URSODIOL 500 MG PO TABS
500.0000 mg | ORAL_TABLET | Freq: Two times a day (BID) | ORAL | 2 refills | Status: DC
  Filled 2022-12-02 – 2023-02-17 (×3): qty 180, 90d supply, fill #0

## 2022-12-02 MED ORDER — URSODIOL 250 MG PO TABS
250.0000 mg | ORAL_TABLET | Freq: Two times a day (BID) | ORAL | 2 refills | Status: DC
  Filled 2022-12-02: qty 180, 90d supply, fill #0
  Filled 2023-02-17: qty 180, 90d supply, fill #1

## 2022-12-03 ENCOUNTER — Other Ambulatory Visit: Payer: Self-pay

## 2022-12-03 ENCOUNTER — Ambulatory Visit: Payer: Commercial Managed Care - PPO | Admitting: Physical Medicine & Rehabilitation

## 2022-12-03 DIAGNOSIS — F331 Major depressive disorder, recurrent, moderate: Secondary | ICD-10-CM | POA: Diagnosis not present

## 2022-12-03 DIAGNOSIS — F411 Generalized anxiety disorder: Secondary | ICD-10-CM | POA: Diagnosis not present

## 2022-12-03 DIAGNOSIS — G4733 Obstructive sleep apnea (adult) (pediatric): Secondary | ICD-10-CM | POA: Diagnosis not present

## 2022-12-04 ENCOUNTER — Encounter: Payer: Self-pay | Admitting: Family Medicine

## 2022-12-04 ENCOUNTER — Ambulatory Visit: Payer: Commercial Managed Care - PPO | Admitting: Family Medicine

## 2022-12-04 NOTE — Telephone Encounter (Signed)
I don't know what our office policy is so I do not know if we can excuse a no show? Since he did have a sleep study last night I would be inclined to excuse the no show. Just have him reschedule the appointment.

## 2022-12-05 DIAGNOSIS — G4733 Obstructive sleep apnea (adult) (pediatric): Secondary | ICD-10-CM | POA: Diagnosis not present

## 2022-12-05 DIAGNOSIS — F331 Major depressive disorder, recurrent, moderate: Secondary | ICD-10-CM | POA: Diagnosis not present

## 2022-12-05 DIAGNOSIS — F411 Generalized anxiety disorder: Secondary | ICD-10-CM | POA: Diagnosis not present

## 2022-12-08 ENCOUNTER — Other Ambulatory Visit: Payer: Self-pay

## 2022-12-08 ENCOUNTER — Other Ambulatory Visit (HOSPITAL_BASED_OUTPATIENT_CLINIC_OR_DEPARTMENT_OTHER): Payer: Self-pay

## 2022-12-08 DIAGNOSIS — L4050 Arthropathic psoriasis, unspecified: Secondary | ICD-10-CM | POA: Diagnosis not present

## 2022-12-08 DIAGNOSIS — D751 Secondary polycythemia: Secondary | ICD-10-CM | POA: Diagnosis not present

## 2022-12-09 ENCOUNTER — Encounter: Payer: Self-pay | Admitting: Physical Medicine & Rehabilitation

## 2022-12-09 DIAGNOSIS — K5902 Outlet dysfunction constipation: Secondary | ICD-10-CM | POA: Diagnosis not present

## 2022-12-09 DIAGNOSIS — K581 Irritable bowel syndrome with constipation: Secondary | ICD-10-CM | POA: Diagnosis not present

## 2022-12-10 ENCOUNTER — Ambulatory Visit
Admission: RE | Admit: 2022-12-10 | Discharge: 2022-12-10 | Disposition: A | Discharge status: Home / Self Care | Payer: Commercial Managed Care - PPO | Source: Ambulatory Visit | Attending: Physical Medicine & Rehabilitation | Admitting: Physical Medicine & Rehabilitation

## 2022-12-10 ENCOUNTER — Other Ambulatory Visit: Payer: Self-pay | Admitting: Physical Medicine & Rehabilitation

## 2022-12-10 ENCOUNTER — Encounter: Payer: Self-pay | Admitting: Physical Medicine & Rehabilitation

## 2022-12-10 ENCOUNTER — Encounter
Payer: Commercial Managed Care - PPO | Attending: Physical Medicine & Rehabilitation | Admitting: Physical Medicine & Rehabilitation

## 2022-12-10 ENCOUNTER — Telehealth: Payer: Self-pay | Admitting: Specialist

## 2022-12-10 ENCOUNTER — Other Ambulatory Visit (HOSPITAL_BASED_OUTPATIENT_CLINIC_OR_DEPARTMENT_OTHER): Payer: Self-pay

## 2022-12-10 VITALS — BP 122/83 | HR 105 | Ht 67.7 in | Wt 201.6 lb

## 2022-12-10 DIAGNOSIS — L4050 Arthropathic psoriasis, unspecified: Secondary | ICD-10-CM | POA: Insufficient documentation

## 2022-12-10 DIAGNOSIS — M5416 Radiculopathy, lumbar region: Secondary | ICD-10-CM | POA: Diagnosis not present

## 2022-12-10 DIAGNOSIS — M546 Pain in thoracic spine: Secondary | ICD-10-CM

## 2022-12-10 DIAGNOSIS — M47816 Spondylosis without myelopathy or radiculopathy, lumbar region: Secondary | ICD-10-CM | POA: Diagnosis not present

## 2022-12-10 DIAGNOSIS — F411 Generalized anxiety disorder: Secondary | ICD-10-CM | POA: Diagnosis not present

## 2022-12-10 DIAGNOSIS — F331 Major depressive disorder, recurrent, moderate: Secondary | ICD-10-CM | POA: Diagnosis not present

## 2022-12-10 MED ORDER — FOLIC ACID 1 MG PO TABS
1.0000 mg | ORAL_TABLET | Freq: Every day | ORAL | 3 refills | Status: DC
  Filled 2022-12-10: qty 90, 90d supply, fill #0
  Filled 2023-03-09: qty 90, 90d supply, fill #1

## 2022-12-10 MED ORDER — MORPHINE SULFATE ER 30 MG PO TBCR
30.0000 mg | EXTENDED_RELEASE_TABLET | Freq: Two times a day (BID) | ORAL | 0 refills | Status: DC
  Filled 2022-12-10 – 2023-01-01 (×2): qty 60, 30d supply, fill #0

## 2022-12-10 MED ORDER — OXYCODONE HCL 10 MG PO TABS
10.0000 mg | ORAL_TABLET | Freq: Two times a day (BID) | ORAL | 0 refills | Status: DC | PRN
  Filled 2022-12-10: qty 60, 30d supply, fill #0

## 2022-12-10 NOTE — Telephone Encounter (Signed)
Patient saw Dr. Riley Kill today.  He received  a referral/script for zynex.  He doesn't know what to do to obtain the device.  Can someone please call him to provide instructions. Shirlean Mylar, MHA, OT/L [phone]

## 2022-12-10 NOTE — Progress Notes (Signed)
Subjective:    Patient ID: Jesse Bowers, male    DOB: February 15, 1966, 57 y.o.   MRN: [id number]  HPI  Jesse Bowers is here in follow up of his chronic pain. He had his ESI in September with Dr. Wynn Bowers in September with improvement of his low back and radicular pain. He has experienced some pain along his left lateral knee which was better initially post injection. He was given some stretches by therapy.  The ESI seemed to help with this pain as well initially but it resurfaced after a few days.  Also he developed pleurisy per cardiology and has had some pain along his mid back next to the medial border of his right scapula. He hasn't been sick. He denies any sternal discomfort/cough, etc. He thinks his posture might be affecting things.    He remains on MS Contin 30 mg every 12 hours for his baseline pain control.  He is down to 1-2 oxycodone 10 mg tablets daily for breakthrough pain with a goal ultimately being once per day.     Pain Inventory Average Pain 7 Pain Right Now 5 My pain is intermittent, sharp, burning, dull, stabbing, tingling, and aching  LOCATION OF PAIN  head, neck, fingers, back, leg  BOWEL Number of stools per week: 7--9   BLADDER Normal    Mobility walk with assistance use a cane use a walker how many minutes can you walk? 5-10 ability to climb steps?  yes do you drive?  yes use a wheelchair transfers alone  Function disabled: date disabled 2014  Neuro/Psych weakness numbness tremor tingling trouble walking spasms dizziness anxiety loss of taste or smell  Prior Studies Any changes since last visit?  no  Physicians involved in your care Any changes since last visit?  no   Family History  Problem Relation Age of Onset   Stroke Maternal Grandfather        84   Heart attack Maternal Grandfather    Glaucoma Maternal Grandfather    Macular degeneration Maternal Grandfather    Breast cancer Sister    Hypertension Mother    Psoriasis  Mother    Other Mother        meningioma developed ~2019   Glaucoma Mother    Cancer Paternal Grandfather    Heart attack Paternal Grandfather    Stroke Paternal Uncle        age 86   Polycythemia Paternal Uncle    Stroke Maternal Grandmother    Congestive Heart Failure Maternal Grandmother    Heart attack Maternal Grandmother    Protein C deficiency Sister 67       Miscarriages   Breast cancer Maternal Aunt 35   Social History   Socioeconomic History   Marital status: Married    Spouse name: Not on file   Number of children: 2   Years of education: 4y college   Highest education level: Master's degree (e.g., MA, MS, MEng, MEd, MSW, MBA)  Occupational History   Occupation: Restaurant manager, fast food    Comment: Not working since CVA 2015  Tobacco Use   Smoking status: Never   Smokeless tobacco: Never  Vaping Use   Vaping status: Never Used  Substance and Sexual Activity   Alcohol use: Yes    Comment: social   Drug use: No   Sexual activity: Yes    Birth control/protection: None    Comment: patient is a transgender on testosterone shots, no biological kids  Other Topics Concern  Not on file  Social History Narrative   Not on file   Social Determinants of Health   Financial Resource Strain: Low Risk  (06/08/2022)   Overall Financial Resource Strain (CARDIA)    Difficulty of Paying Living Expenses: Not hard at all  Food Insecurity: Low Risk  (11/20/2022)   Received from [facility]   Hunger Vital Sign    Worried About Running Out of Food in the Last Year: Never true    Ran Out of Food in the Last Year: Never true  Transportation Needs: No Transportation Needs (11/20/2022)   Received from [facility]    In the past 12 months, has lack of reliable transportation kept you from medical appointments, meetings, work or from getting things needed for daily living? : No  Physical Activity: Unknown (06/08/2022)   Exercise Vital Sign    Days of  Exercise per Week: 0 days    Minutes of Exercise per Session: Not on file  Stress: Stress Concern Present (06/08/2022)   [employer] of Occupational Health - Occupational Stress Questionnaire    Feeling of Stress : Rather much  Social Connections: Socially Isolated (06/08/2022)   Social Connection and Isolation Panel [NHANES]    Frequency of Communication with Friends and Family: Once a week    Frequency of Social Gatherings with Friends and Family: Never    Attends Religious Services: Never    Database administrator or Organizations: No    Attends Engineer, structural: Not on file    Marital Status: Married   Past Surgical History:  Procedure Laterality Date   ABDOMINAL HYSTERECTOMY Bilateral 1994   TAH, BSO- tranverse incision at 57 yo   ANKLE ARTHROSCOPY WITH RECONSTRUCTION Right 2007   CHOLECYSTECTOMY     laparoscopic   COLONOSCOPY     x3   EYE SURGERY     Left eye 03/02/2018, right 02/15/2018   HEMORROIDECTOMY  08/19/2020   HIP ARTHROSCOPY W/ LABRAL REPAIR Right 05/11/2013   acetabular labral tear 03/30/2013   KNEE ARTHROPLASTY Right    KNEE JOINT MANIPULATION Left    x3 under anesthesia   KNEE SURGERY Bilateral 1984   Right ACL, left PCL repair   LITHOTRIPSY  2005   LIVER BIOPSY  2013   normal results.   MASTECTOMY Bilateral    prior to 2009   MOUTH SURGERY     NASAL SEPTUM SURGERY N/A 09/20/2015   by ENT Dr. Ezzard Bowers   OVARIAN CYST SURGERY Left    size of grapefruit, was informed that she had shortened vagina   SHOULDER SURGERY Bilateral    Right 08/15/2016, Left 11/15/2016   THUMB ARTHROSCOPY Left    THYROIDECTOMY, PARTIAL Left 2008   TOTAL KNEE ARTHROPLASTY Right 08/23/2018   Procedure: TOTAL KNEE ARTHROPLASTY;  Surgeon: Jesse Gross, MD;  Location: [facility];  Service: Orthopedics;  Laterality: Right;    TOTAL KNEE REVISION Left 02/06/2016   Procedure: LEFT TOTAL KNEE REVISION;  Surgeon: Jesse Gross, MD;  Location: [facility];  Service:  Orthopedics;  Laterality: Left;   TOTAL KNEE REVISION Left 04/22/2017   Procedure: Left knee polyethylene revision;  Surgeon: Jesse Gross, MD;  Location: [facility];  Service: Orthopedics;  Laterality: Left;   UPPER GI ENDOSCOPY  2003   Past Medical History:  Diagnosis Date   Abnormal weight loss    Anxiety    Arthritis    Cataract    OU   Celiac disease  Cervical neck pain with evidence of disc disease    patient has a cyst    Chronic constipation    Chronic diastolic heart failure (HCC)    Pt. 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   Family history of adverse reaction to anesthesia    family has problems with anesthesia of nausea and vomiting    GERD (gastroesophageal reflux disease)    History of in 20's   Gluten enteropathy    H/O parotitis    right    Hard of hearing    History of kidney stones    History of retinal tear    Bilateral   History of staph infection    required wound vac   Hx-TIA (transient ischemic attack)    2015   Kidney stones 08/2020   LVH (left ventricular hypertrophy) 12/15/2016   Mild, noted on ECHO   MVP (mitral valve prolapse)    NAFL (nonalcoholic fatty liver)    Neck pain    Neuromuscular disorder (HCC)    bilateral neuropathy feet.   Nuclear sclerotic cataract of both eyes 09/08/2019   Pneumonia 12/17/2010   Polycythemia    Polycythemia, secondary    PONV (postoperative nausea and vomiting)    Protein C deficiency (HCC)    Dr. Thomes Cake   Psoriasis    16 X10 cm psoriatic rash on sole of left foot ; open and occ scant bleeding;    psoriatic arthritis    PTSD (post-traumatic stress disorder)    Scaphoid fracture of wrist 09/23/2013   Seizure (HCC)    childhood, medication until age 38 then weanned completely  off   Sleep apnea    split night study last done by Dr. Epimenio Foot 06/18/15 shows severe OSA, CSA, and hypersomnia, rec bipap   Splenomegaly    Stenosis of ureteropelvic junction (UPJ)    left   Stroke [facility])    CVA vs TIA in left cerebrum causing slight right sided weakness-Dr. 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    Wears glasses    Wears hearing aid    BP 122/83   Pulse (!) 105   Ht 5' 7.7" (1.72 m)   Wt 201 lb 9.6 oz (91.4 kg)   SpO2 95%   BMI 30.93 kg/m   Opioid Risk Score:   Fall Risk Score:  `1  Depression screen PHQ 2/9     08/06/2022    3:14 PM 07/07/2022    1:32 PM 06/12/2022    2:24 PM 06/06/2022    1:02 PM 04/09/2022    9:32 AM 03/27/2022    8:30 AM 02/05/2022    1:07 PM  Depression screen PHQ 2/9  Decreased Interest 0 1 0 0 0 0 0  Down, Depressed, Hopeless 0 1 0 0 0 0 0  PHQ - 2 Score 0 2 0 0 0 0 0  Altered sleeping  1 2  3 2    Tired, decreased energy  0  3 1   Change in appetite  0 0  0 0   Feeling bad or failure about yourself   0 0  0 0   Trouble concentrating  2 2  0 1   Moving slowly or fidgety/restless  0 0  0 0   Suicidal thoughts  0 0  0 0   PHQ-9 Score  5 4  6 4    Difficult doing work/chores  Somewhat difficult   Somewhat difficult Somewhat difficult     Review of Systems  Eyes:  Positive for pain.  Gastrointestinal:  Positive for abdominal pain and nausea.  Musculoskeletal:  Positive for gait problem.  Neurological:  Positive for dizziness, tremors, weakness and numbness.       Spasms, tingling  Psychiatric/Behavioral:         Anxiety  All other systems reviewed and are negative.      Objective:   Physical Exam General: No acute distress HEENT: NCAT, EOMI, oral membranes moist Cards: reg rate  Chest: normal effort Abdomen: Soft, NT,  ND Skin: dry, intact Extremities: no edema Psych: pleasant and appropriate  Skin: intact Neuro: Alert and oriented x 3. Normal insight and awareness. Intact Memory. Normal language and speech. Cranial nerve exam unremarkable. LE motor 5/5 LLE and 4-5/ RLe with more weakness in ADF than anywhere else. He has stocking glove sensory loss in both feet below ankles as well as sensory loss along left lateral leg. Right 2-4 toes more affected. Tends to land farther ahead with left foot than right.  valgus at left knee, steppage pattern with right knee still somewhat present.    no abnormal resting tone Musculoskeletal:  pain at left TFL, tib-fib area with palpation and extension. Mid back tender along spinaous process at T3-6 level. No major muscle spasm. Posture fair iwht elevation of right pelvs           Assessment & Plan:  1. Psoriatic arthritis with pain in multiple areas, most prominently feet, hands, elbows. Pain also related to his prior CVA and associated motor/sensory change   2. 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 3. Hx of left total knee revision again on 04/21/17 per [facility] and right TKA 08/2018 thru [facility].  4. Chronic  low back pain---MRI with severe DDD at L5-S1. Left S1 radiculopathy? only mild foraminal stenosis however -S/P left translaminar L5-S1 ESI by Dr. Wynn Bowers.   -continue stretches for his TFL.which I think is part of his left knee problem -will check t-spine film for occult fracture given that there was no evidence of soft tissue injury nor history conducive with that. -nexwave --TENS for back pain - 5. Protein C deficiency   6. Left shoulder subluxation, bicipital tendonitis 7. Central sleep apnea 8. Chronic opioid use for pain             -continue ms contin 30mg  q12 hours. Today RF #60             -continue  oxycodone 10mg  q12 prn with goal of every day prn  #60 today             -would like to wean oxycodone as heals surgically and after potential ESI             We will continue the controlled substance monitoring program, this consists of regular clinic visits, examinations,  routine drug screening, pill counts as well as use of [place] Controlled Substance Reporting System. [place].   9. Depression with anxiety.  0. Left lateral epidondylitis  11. 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  has had right and left sided weakness with sensory loss in C4-6 dermatomes.. Most recent cervical MRI without significant change 12. 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 OINC 14. Right wrist psoriatic arthritis, tendonapthy, ganglion cysts s/p multiple recent surgeries.  s/p right thumb replacement             -is followed by Dr. Orlan Leavens at [facility]. Recent gender reconstruction surgery--stable   20 minutes of face to face patient care time were spent during this visit. All questions were encouraged and answered. Follow up with me in about 2 months.

## 2022-12-11 ENCOUNTER — Other Ambulatory Visit: Payer: Self-pay | Admitting: Physician Assistant

## 2022-12-11 DIAGNOSIS — E041 Nontoxic single thyroid nodule: Secondary | ICD-10-CM | POA: Diagnosis not present

## 2022-12-11 DIAGNOSIS — R1310 Dysphagia, unspecified: Secondary | ICD-10-CM

## 2022-12-12 ENCOUNTER — Encounter: Payer: Self-pay | Admitting: Physical Medicine & Rehabilitation

## 2022-12-12 DIAGNOSIS — F411 Generalized anxiety disorder: Secondary | ICD-10-CM | POA: Diagnosis not present

## 2022-12-12 DIAGNOSIS — F331 Major depressive disorder, recurrent, moderate: Secondary | ICD-10-CM | POA: Diagnosis not present

## 2022-12-15 ENCOUNTER — Other Ambulatory Visit: Payer: Self-pay

## 2022-12-16 ENCOUNTER — Encounter: Payer: Self-pay | Admitting: Family Medicine

## 2022-12-16 ENCOUNTER — Other Ambulatory Visit (HOSPITAL_COMMUNITY): Payer: Self-pay

## 2022-12-16 ENCOUNTER — Encounter: Payer: Self-pay | Admitting: Physical Medicine & Rehabilitation

## 2022-12-16 ENCOUNTER — Other Ambulatory Visit: Payer: Self-pay

## 2022-12-16 ENCOUNTER — Other Ambulatory Visit (HOSPITAL_BASED_OUTPATIENT_CLINIC_OR_DEPARTMENT_OTHER): Payer: Self-pay

## 2022-12-16 ENCOUNTER — Ambulatory Visit: Payer: Commercial Managed Care - PPO | Admitting: Family Medicine

## 2022-12-16 VITALS — BP 122/82 | HR 95 | Temp 98.3°F | Ht 67.0 in | Wt 205.3 lb

## 2022-12-16 DIAGNOSIS — T50905A Adverse effect of unspecified drugs, medicaments and biological substances, initial encounter: Secondary | ICD-10-CM | POA: Diagnosis not present

## 2022-12-16 DIAGNOSIS — E669 Obesity, unspecified: Secondary | ICD-10-CM | POA: Diagnosis not present

## 2022-12-16 MED ORDER — METFORMIN HCL 500 MG PO TABS
500.0000 mg | ORAL_TABLET | Freq: Two times a day (BID) | ORAL | 1 refills | Status: DC
  Filled 2022-12-16: qty 180, 90d supply, fill #0
  Filled 2023-01-27 – 2023-03-20 (×2): qty 180, 90d supply, fill #1

## 2022-12-16 MED ORDER — PHENTERMINE HCL 37.5 MG PO TABS
37.5000 mg | ORAL_TABLET | Freq: Every day | ORAL | 0 refills | Status: DC
  Filled 2022-12-16 – 2023-03-09 (×5): qty 30, 30d supply, fill #0

## 2022-12-16 MED ORDER — PHENTERMINE HCL 37.5 MG PO TABS
37.5000 mg | ORAL_TABLET | Freq: Every day | ORAL | 0 refills | Status: DC
  Filled 2022-12-16 – 2023-01-15 (×2): qty 30, 30d supply, fill #0

## 2022-12-16 MED ORDER — PHENTERMINE HCL 37.5 MG PO TABS
37.5000 mg | ORAL_TABLET | Freq: Every day | ORAL | 0 refills | Status: DC
  Filled 2022-12-16: qty 30, 30d supply, fill #0

## 2022-12-16 NOTE — Assessment & Plan Note (Addendum)
Weight is stable, minus a few pounds. Will continue metformin. Will try switching the patient to the tablets of phentermine instead of the capsules, this way he can take them during the times he is most hungry. Co morbid condition is OSA.

## 2022-12-16 NOTE — Progress Notes (Signed)
Established Patient Office Visit  Subjective   Patient ID: Jesse Bowers, adult    DOB: October 29, 1965  Age: 57 y.o. MRN: [id number]  Chief Complaint  Patient presents with   Medical Management of Chronic Issues    Pt is here for follow up on weight loss today. He reports that the phentermine was working "ok" but he still feels the urge to eat at nighttime. Reports that his appetite isn't that robust anyway. Had side effects with the topiramate-- severe diarrhea. Hasn't lost much weight since the last visit, maybe a few pounds. States that his carbs are around 60 and his calories around 1700.     Current Outpatient Medications  Medication Instructions   acyclovir ointment (ZOVIRAX) 5 % Apply topically every 3 (three) hours. For acute breakout of cold sores for 7 days.   baclofen (LIORESAL) 20-40 mg, Oral, 4 times daily, 1 tablet with breakfast, lunch, and dinner and 2 tablets at bedtime   calcium carbonate (OS-CAL) 1250 (500 Ca) MG chewable tablet 1 tablet, Oral, Daily   ciprofloxacin-dexamethasone (CIPRODEX) OTIC suspension Place 4 drops into affected ear(s) 2 (two) times daily for 7 days.   desonide (DESOWEN) 0.05 % cream Apply to affected area(s) twice daily   desvenlafaxine (PRISTIQ) 100 MG 24 hr tablet Take 1 tablet (100 mg total) by mouth daily. (total dose 150mg )   folic acid (KP FOLIC ACID) 1 MG tablet Take 1 tablet po every day   folic acid (KP FOLIC ACID) 1 mg, Oral, Daily   furosemide (LASIX) 80 mg, Oral, Daily   furosemide (LASIX) 20 mg, Oral, Daily PRN   hydrOXYzine (ATARAX) 25 MG tablet Take 1 tablet (25 mg total) by mouth 4 (four) times daily as needed for anxiety   Ibsrela 50 mg, Oral, 2 times daily   Loteprednol Etabonate (LOTEMAX SM) 0.38 % GEL 1 drop, Both Eyes, 3 times daily   metFORMIN (GLUCOPHAGE) 500 mg, Oral, 2 times daily with meals   methotrexate (RHEUMATREX) 15 mg, Oral, Weekly   metoprolol tartrate (LOPRESSOR) 25 mg, Oral, 2 times daily PRN   morphine  (MS CONTIN) 30 mg, Oral, Every 12 hours   mupirocin ointment (BACTROBAN) 2 % Apply a small amount to affected area of nose twice daily for 14 days.   OVER THE COUNTER MEDICATION Tums-1000mg  once a day   Oxycodone HCl 10 mg, Oral, Every 12 hours PRN   Pancrelipase, Lip-Prot-Amyl, (CREON) 24000-76000 units CPEP Take 2 capsules by mouth in the morning and 2 capsules at noon and 2 capsules in the evening. Take with meals.   Pancrelipase, Lip-Prot-Amyl, (CREON) 24000-76000 units CPEP Oral   pantoprazole (PROTONIX) 40 MG tablet Take 1 tablet (40 mg total) by mouth every morning before breakfast.   [START ON 02/10/2023] phentermine (ADIPEX-P) 37.5 mg, Oral, Daily before breakfast, Or 1/2 tablet twice a day.   [START ON 01/14/2023] phentermine (ADIPEX-P) 37.5 mg, Oral, Daily before breakfast, Or 1/2 tablet twice a day.   phentermine (ADIPEX-P) 37.5 mg, Oral, Daily before breakfast, Or 1/2 tablet twice a day.   potassium chloride (KLOR-CON) 10 MEQ tablet Take 2 tablets by mouth every day.   RABEprazole (ACIPHEX) 20 MG tablet Take 1 tablet (20 mg total) by mouth in the morning AND 1 tablet (20 mg total) every evening. Take before meals.   Secukinumab, 300 MG Dose, (COSENTYX SENSOREADY, 300 MG,) 150 MG/ML SOAJ Inject 2(two) pens subcutaneous every 4 weeks   Testosterone 25 MG/2.5GM (1%) GEL Apply a pea-sized amount topically to  desired area once daily.   testosterone cypionate (DEPOTESTOSTERONE CYPIONATE) 100 mg, Intramuscular, Weekly   topiramate (TOPAMAX) 50 mg, Oral, 2 times daily   ursodiol (ACTIGALL) 500 mg, Oral, 2 times daily   ursodiol (ACTIGALL) 250 mg, Oral, 2 times daily   Vitamin D (Ergocalciferol) (DRISDOL) 50,000 Units, Oral, Weekly   Xarelto 20 mg, Oral, (Dosepack) Nightly - one time    Patient Active Problem List   Diagnosis Date Noted   Thoracic spine pain 12/10/2022   Bilateral lumbar radiculopathy 10/31/2022   Lumbar disc disease 08/06/2022   Iliotibial band syndrome 08/06/2022    Non-recurrent acute suppurative otitis media of both ears without spontaneous rupture of tympanic membranes 07/09/2022   Weight gain due to medication 07/09/2022   Pancreatic insufficiency 04/09/2022   Posterior vitreous detachment of right eye 11/18/2021   Spondylosis of lumbar spine 10/03/2020   Pseudophakia of both eyes 05/07/2020   Retinal telangiectasis of both eyes 05/07/2020   Lattice degeneration of peripheral retina, left 09/08/2019   Lattice degeneration, right eye 09/08/2019   Chronic diastolic heart failure (HCC) 05/24/2019   Urinary dysfunction 04/12/2019   Osteoarthritis of right knee 08/23/2018   Constipation due to opioid therapy 03/30/2018   SNHL (sensorineural hearing loss) 12/04/2017   Abnormal urinary stream 12/03/2017   Osteoarthritis of carpometacarpal (CMC) joint of thumb 11/30/2017   Muscle weakness 11/17/2017   Transient vision disturbance 11/12/2017   Bilateral hand pain 10/30/2017   Pain of left hip joint 10/09/2017   Gynecomastia 07/10/2017   Iron deficiency anemia 07/05/2017   Spasticity 05/20/2017   Lumbar radiculitis 04/20/2017   Ulnar 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, left 11/28/2016   Idiopathic peripheral neuropathy 11/28/2016   Failed total knee arthroplasty, 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   Long term (current) use of anticoagulants 08/23/2015   Right upper quadrant abdominal pain 08/23/2015   Memory loss 05/10/2015   Gait abnormality 04/07/2015   Alkaline phosphatase elevation 04/07/2015   History of thrombosis 03/26/2015   Medial epicondylitis 02/07/2015   Cognitive decline 12/21/2014   Leukopenia 12/05/2014   Rotator cuff syndrome of right shoulder 10/27/2014   Status post left knee replacement 08/22/2014   Left lateral epicondylitis 08/22/2014   Chronic cerebral ischemia 08/18/2014   Arthrofibrosis of knee joint 08/17/2014   Cubital canal compression syndrome, left 08/17/2014   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   Chronic pain syndrome 04/10/2014   Insomnia  04/10/2014   Chronic non-specific white matter lesions on MRI 04/10/2014   CFS (chronic fatigue syndrome) 04/10/2014   Biceps tendonitis on left 03/01/2014   Polycythemia vera (HCC) 12/27/2013   H/O TIA (transient ischemic attack) and stroke 12/27/2013   Neck pain 12/27/2013   OSA (obstructive sleep apnea) 12/08/2013   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/31/2013   Protein C deficiency (HCC) 08/01/2013   Post traumatic stress disorder (PTSD) 08/01/2013   Speech abnormality 07/25/2013   Obesity 05/10/2013   Lower extremity edema 05/10/2013   GERD (gastroesophageal reflux disease) 05/10/2013   Arthritis 05/10/2013   OA (osteoarthritis) of knee 03/15/2013   Headache 10/25/2012   Palpitations 10/18/2012   Fatty liver determined by biopsy 06/01/2012   Arthropathic psoriasis, unspecified (HCC) 04/29/2012   Abnormal liver enzymes 03/29/2012   Psoriatic arthritis (HCC) 12/29/2011   Left Renal Hydronephrosis 12/11/2010   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  Objective:     BP 122/82 (BP Location: Left Arm, Patient Position: Sitting, Cuff Size: Large)   Pulse 95   Temp 98.3 F (36.8 C) (Oral)   Ht 5\' 7"  (1.702 m)   Wt 205 lb 4.8 oz (93.1 kg)   SpO2 98%   BMI 32.15 kg/m    Physical Exam Vitals reviewed.  Constitutional:      Appearance: Normal appearance. He is obese.  Cardiovascular:     Rate and Rhythm: Normal rate and regular rhythm.     Pulses: Normal pulses.     Heart sounds: Normal heart sounds.  Pulmonary:     Effort: Pulmonary effort is normal.  Neurological:     Mental Status: He is alert and oriented to person, place, and time. Mental status is at baseline.  Psychiatric:        Mood and Affect: Mood normal.        Behavior: Behavior normal.      No results found  for any visits on 12/16/22.    The ASCVD Risk score (Arnett DK, et al., 2019) failed to calculate for the following reasons:   The patient has a prior MI or stroke diagnosis    Assessment & Plan:  Obesity (BMI 30-39.9) -     Phentermine HCl; Take 1 tablet (37.5 mg total) by mouth daily before breakfast. Or 1/2 tablet twice a day.  Dispense: 30 tablet; Refill: 0 -     Phentermine HCl; Take 1 tablet (37.5 mg total) by mouth daily before breakfast. Or 1/2 tablet twice a day.  Dispense: 30 tablet; Refill: 0 -     Phentermine HCl; Take 1 tablet (37.5 mg total) by mouth daily before breakfast. Or 1/2 tablet twice a day.  Dispense: 30 tablet; Refill: 0  Weight gain due to medication Assessment & Plan: Weight is stable, minus a few pounds. Will continue metformin. Will try switching the patient to the tablets of phentermine instead of the capsules, this way he can take them during the times he is most hungry. Co morbid condition is OSA.  Orders: -     metFORMIN HCl; Take 1 tablet (500 mg total) by mouth 2 (two) times daily with a meal.  Dispense: 180 tablet; Refill: 1     Return in about 4 months (around 04/18/2023) for weight loss, annual physical exam.    Karie Georges, MD

## 2022-12-17 ENCOUNTER — Other Ambulatory Visit (HOSPITAL_BASED_OUTPATIENT_CLINIC_OR_DEPARTMENT_OTHER): Payer: Self-pay

## 2022-12-17 DIAGNOSIS — F331 Major depressive disorder, recurrent, moderate: Secondary | ICD-10-CM | POA: Diagnosis not present

## 2022-12-17 DIAGNOSIS — F411 Generalized anxiety disorder: Secondary | ICD-10-CM | POA: Diagnosis not present

## 2022-12-18 ENCOUNTER — Other Ambulatory Visit: Payer: Self-pay

## 2022-12-18 ENCOUNTER — Other Ambulatory Visit (HOSPITAL_COMMUNITY): Payer: Self-pay

## 2022-12-18 DIAGNOSIS — M25531 Pain in right wrist: Secondary | ICD-10-CM | POA: Diagnosis not present

## 2022-12-18 NOTE — Progress Notes (Signed)
Specialty Pharmacy Refill Coordination Note  Jesse Bowers is a 57 y.o. adult contacted today regarding refills of specialty medication(s) Secukinumab   Patient requested [name] at [facility] [place] date: 12/19/22   Medication will be filled on 12/18/22.

## 2022-12-19 ENCOUNTER — Ambulatory Visit (INDEPENDENT_AMBULATORY_CARE_PROVIDER_SITE_OTHER): Payer: Commercial Managed Care - PPO | Admitting: Podiatry

## 2022-12-19 ENCOUNTER — Ambulatory Visit (INDEPENDENT_AMBULATORY_CARE_PROVIDER_SITE_OTHER): Payer: Commercial Managed Care - PPO

## 2022-12-19 ENCOUNTER — Other Ambulatory Visit (HOSPITAL_BASED_OUTPATIENT_CLINIC_OR_DEPARTMENT_OTHER): Payer: Self-pay

## 2022-12-19 DIAGNOSIS — M7662 Achilles tendinitis, left leg: Secondary | ICD-10-CM | POA: Diagnosis not present

## 2022-12-19 DIAGNOSIS — F411 Generalized anxiety disorder: Secondary | ICD-10-CM | POA: Diagnosis not present

## 2022-12-19 DIAGNOSIS — L603 Nail dystrophy: Secondary | ICD-10-CM

## 2022-12-19 DIAGNOSIS — M9262 Juvenile osteochondrosis of tarsus, left ankle: Secondary | ICD-10-CM | POA: Diagnosis not present

## 2022-12-19 DIAGNOSIS — M7661 Achilles tendinitis, right leg: Secondary | ICD-10-CM | POA: Diagnosis not present

## 2022-12-19 DIAGNOSIS — F331 Major depressive disorder, recurrent, moderate: Secondary | ICD-10-CM | POA: Diagnosis not present

## 2022-12-19 DIAGNOSIS — K900 Celiac disease: Secondary | ICD-10-CM | POA: Diagnosis not present

## 2022-12-19 DIAGNOSIS — M9261 Juvenile osteochondrosis of tarsus, right ankle: Secondary | ICD-10-CM

## 2022-12-19 DIAGNOSIS — S93491A Sprain of other ligament of right ankle, initial encounter: Secondary | ICD-10-CM | POA: Diagnosis not present

## 2022-12-19 MED ORDER — METHYLPREDNISOLONE 4 MG PO TBPK
ORAL_TABLET | ORAL | 0 refills | Status: AC
  Filled 2022-12-19: qty 21, 6d supply, fill #0

## 2022-12-19 NOTE — Patient Instructions (Signed)
Achilles Tendinitis  with Rehab Achilles tendinitis is a disorder of the Achilles tendon. The Achilles tendon connects the large calf muscles (Gastrocnemius and Soleus) to the heel bone (calcaneus). This tendon is sometimes called the heel cord. It is important for pushing-off and standing on your toes and is important for walking, running, or jumping. Tendinitis is often caused by overuse and repetitive microtrauma. SYMPTOMS  Pain, tenderness, swelling, warmth, and redness may occur over the Achilles tendon even at rest.  Pain with pushing off, or flexing or extending the ankle.  Pain that is worsened after or during activity. CAUSES   Overuse sometimes seen with rapid increase in exercise programs or in sports requiring running and jumping.  Poor physical conditioning (strength and flexibility or endurance).  Running sports, especially training running down hills.  Inadequate warm-up before practice or play or failure to stretch before participation.  Injury to the tendon. PREVENTION   Warm up and stretch before practice or competition.  Allow time for adequate rest and recovery between practices and competition.  Keep up conditioning.  Keep up ankle and leg flexibility.  Improve or keep muscle strength and endurance.  Improve cardiovascular fitness.  Use proper technique.  Use proper equipment (shoes, skates).  To help prevent recurrence, taping, protective strapping, or an adhesive bandage may be recommended for several weeks after healing is complete. PROGNOSIS   Recovery may take weeks to several months to heal.  Longer recovery is expected if symptoms have been prolonged.  Recovery is usually quicker if the inflammation is due to a direct blow as compared with overuse or sudden strain. RELATED COMPLICATIONS   Healing time will be prolonged if the condition is not correctly treated. The injury must be given plenty of time to heal.  Symptoms can reoccur if  activity is resumed too soon.  Untreated, tendinitis may increase the risk of tendon rupture requiring additional time for recovery and possibly surgery. TREATMENT   The first treatment consists of rest anti-inflammatory medication, and ice to relieve the pain.  Stretching and strengthening exercises after resolution of pain will likely help reduce the risk of recurrence. Referral to a physical therapist or athletic trainer for further evaluation and treatment may be helpful.  A walking boot or cast may be recommended to rest the Achilles tendon. This can help break the cycle of inflammation and microtrauma.  Arch supports (orthotics) may be prescribed or recommended by your caregiver as an adjunct to therapy and rest.  Surgery to remove the inflamed tendon lining or degenerated tendon tissue is rarely necessary and has shown less than predictable results. MEDICATION   Nonsteroidal anti-inflammatory medications, such as aspirin and ibuprofen, may be used for pain and inflammation relief. Do not take within 7 days before surgery. Take these as directed by your caregiver. Contact your caregiver immediately if any bleeding, stomach upset, or signs of allergic reaction occur. Other minor pain relievers, such as acetaminophen, may also be used.  Pain relievers may be prescribed as necessary by your caregiver. Do not take prescription pain medication for longer than 4 to 7 days. Use only as directed and only as much as you need. HEAT AND COLD  Cold is used to relieve pain and reduce inflammation for acute and chronic Achilles tendinitis. Cold should be applied for 10 to 15 minutes every 2 to 3 hours for inflammation and pain and immediately after any activity that aggravates your symptoms. Use ice packs or an ice massage.  Heat may be used   before performing stretching and strengthening activities prescribed by your caregiver. Use a heat pack or a warm soak. SEEK MEDICAL CARE IF:  Symptoms get  worse or do not improve in 2 weeks despite treatment.  New, unexplained symptoms develop. Drugs used in treatment may produce side effects.   EXERCISES-- hold each stretch for 30 seconds and repeat 10 times.  Complete each stretch 3 times per day.   RANGE OF MOTION (ROM) AND STRETCHING EXERCISES - Achilles Tendinitis  These exercises may help you when beginning to rehabilitate your injury. Your symptoms may resolve with or without further involvement from your physician, physical therapist or athletic trainer. While completing these exercises, remember:   Restoring tissue flexibility helps normal motion to return to the joints. This allows healthier, less painful movement and activity.  An effective stretch should be held for at least 30 seconds.  A stretch should never be painful. You should only feel a gentle lengthening or release in the stretched tissue. STRETCH  Gastroc, Standing   Place hands on wall.  Extend right / left leg, keeping the front knee somewhat bent.  Slightly point your toes inward on your back foot.  Keeping your right / left heel on the floor and your knee straight, shift your weight toward the wall, not allowing your back to arch.  You should feel a gentle stretch in the right / left calf. Hold this position for __________ seconds. Repeat __________ times. Complete this stretch __________ times per day. STRETCH  Soleus, Standing   Place hands on wall.  Extend right / left leg, keeping the other knee somewhat bent.  Slightly point your toes inward on your back foot.  Keep your right / left heel on the floor, bend your back knee, and slightly shift your weight over the back leg so that you feel a gentle stretch deep in your back calf.  Hold this position for __________ seconds. Repeat __________ times. Complete this stretch __________ times per day. STRETCH  Gastrocsoleus, Standing  Note: This exercise can place a lot of stress on your foot and ankle.  Please complete this exercise only if specifically instructed by your caregiver.   Place the ball of your right / left foot on a step, keeping your other foot firmly on the same step.  Hold on to the wall or a rail for balance.  Slowly lift your other foot, allowing your body weight to press your heel down over the edge of the step.  You should feel a stretch in your right / left calf.  Hold this position for __________ seconds.  Repeat this exercise with a slight bend in your knee. Repeat __________ times. Complete this stretch __________ times per day.  STRENGTHENING EXERCISES - Achilles Tendinitis These exercises may help you when beginning to rehabilitate your injury. They may resolve your symptoms with or without further involvement from your physician, physical therapist or athletic trainer. While completing these exercises, remember:   Muscles can gain both the endurance and the strength needed for everyday activities through controlled exercises.  Complete these exercises as instructed by your physician, physical therapist or athletic trainer. Progress the resistance and repetitions only as guided.  You may experience muscle soreness or fatigue, but the pain or discomfort you are trying to eliminate should never worsen during these exercises. If this pain does worsen, stop and make certain you are following the directions exactly. If the pain is still present after adjustments, discontinue the exercise until you can discuss   the trouble with your clinician. STRENGTH - Plantar-flexors   Sit with your right / left leg extended. Holding onto both ends of a rubber exercise band/tubing, loop it around the ball of your foot. Keep a slight tension in the band.  Slowly push your toes away from you, pointing them downward.  Hold this position for __________ seconds. Return slowly, controlling the tension in the band/tubing. Repeat __________ times. Complete this exercise __________ times  per day.  STRENGTH - Plantar-flexors   Stand with your feet shoulder width apart. Steady yourself with a wall or table using as little support as needed.  Keeping your weight evenly spread over the width of your feet, rise up on your toes.*  Hold this position for __________ seconds. Repeat __________ times. Complete this exercise __________ times per day.  *If this is too easy, shift your weight toward your right / left leg until you feel challenged. Ultimately, you may be asked to do this exercise with your right / left foot only. STRENGTH  Plantar-flexors, Eccentric  Note: This exercise can place a lot of stress on your foot and ankle. Please complete this exercise only if specifically instructed by your caregiver.   Place the balls of your feet on a step. With your hands, use only enough support from a wall or rail to keep your balance.  Keep your knees straight and rise up on your toes.  Slowly shift your weight entirely to your right / left toes and pick up your opposite foot. Gently and with controlled movement, lower your weight through your right / left foot so that your heel drops below the level of the step. You will feel a slight stretch in the back of your calf at the end position.  Use the healthy leg to help rise up onto the balls of both feet, then lower weight only on the right / left leg again. Build up to 15 repetitions. Then progress to 3 consecutive sets of 15 repetitions.*  After completing the above exercise, complete the same exercise with a slight knee bend (about 30 degrees). Again, build up to 15 repetitions. Then progress to 3 consecutive sets of 15 repetitions.* Perform this exercise __________ times per day.  *When you easily complete 3 sets of 15, your physician, physical therapist or athletic trainer may advise you to add resistance by wearing a backpack filled with additional weight. STRENGTH - Plantar Flexors, Seated   Sit on a chair that allows your feet  to rest flat on the ground. If necessary, sit at the edge of the chair.  Keeping your toes firmly on the ground, lift your right / left heel as far as you can without increasing any discomfort in your ankle. Repeat __________ times. Complete this exercise __________ times a day. *If instructed by your physician, physical therapist or athletic trainer, you may add ____________________ of resistance by placing a weighted object on your right / left knee. Document Released: 09/18/2004 Document Revised: 05/12/2011 Document Reviewed: 06/01/2008 [employer] Patient Information 2014 [employer].    

## 2022-12-19 NOTE — Progress Notes (Signed)
Subjective:  Patient ID: Jesse Bowers, adult    DOB: June 19, 1965,  MRN: [id number]  Chief Complaint  Patient presents with   Foot Pain    HX OF PTT SX ON THE RIGHT, CHRONIC PAIN IN THE BACK OF HEEL BILAT    Nail Problem    NAIL BILAT      57 y.o. adult presents with concern for pain in the back of the bilateral heel.  Left worse than right.  Has pain with pressure on the back of the heels as well as when he gets up in the morning and does notice some tightness in his calves bilaterally.  Patient does have extensive surgical history on the right lower extremity couple of reconstructive foot surgeries.  Also concerned about possible nail discoloration crumbling and abnormal growth.  Past Medical History:  Diagnosis Date   Abnormal weight loss    Anxiety    Arthritis    Cataract    OU   Celiac disease    Cervical neck pain with evidence of disc disease    patient has a cyst    Chronic constipation    Chronic diastolic heart failure (HCC)    Pt. 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   Family history of adverse reaction to anesthesia    family has problems with anesthesia of nausea and vomiting    GERD (gastroesophageal reflux disease)    History of in 20's   Gluten enteropathy    H/O parotitis    right    Hard of hearing    History of kidney stones    History of retinal tear    Bilateral   History of staph infection    required wound vac   Hx-TIA (transient ischemic attack)    2015   Kidney stones 08/2020   LVH (left ventricular hypertrophy) 12/15/2016   Mild, noted on ECHO   MVP (mitral valve prolapse)    NAFL (nonalcoholic fatty liver)    Neck pain    Neuromuscular disorder (HCC)    bilateral neuropathy feet.   Nuclear sclerotic cataract  of both eyes 09/08/2019   Pneumonia 12/17/2010   Polycythemia    Polycythemia, secondary    PONV (postoperative nausea and vomiting)    Protein C deficiency (HCC)    Dr. Thomes Cake   Psoriasis    16 X10 cm psoriatic rash on sole of left foot ; open and occ scant bleeding;    psoriatic arthritis    PTSD (post-traumatic stress disorder)    Scaphoid fracture of wrist 09/23/2013   Seizure (HCC)    childhood, medication until age 61 then weanned completely off   Sleep apnea    split night study last done by Dr. 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 Epimenio Foot follows   Syrinx of spinal cord (HCC) 01/06/2014   c spine on MRI   Tachycardia    hx of    Transfusion history    past history- none recent, after surgeries due  to blood loss   Transgender with history of gender affirmation surgery    Wears glasses    Wears hearing aid     Allergies  Allergen Reactions   Gabapentin Nausea Only and Other (See Comments)    Other reaction(s): nausea, mental status  Drowsiness and restlessness. Pt. States, " It makes me crazy, I can't take this medicine."  Other reaction(s): Other (See Comments)  Tardive dyskinesia  Other Reaction(s): Other (See Comments)  Drowsiness and restlessness.   Misc. Sulfonamide Containing Compounds Rash and Other (See Comments)    Stevens-Johnson rash   Penicillin G Anaphylaxis and Other (See Comments)    Has patient had a PCN reaction causing immediate rash, facial/tongue/throat swelling, SOB or lightheadedness with hypotension: Yes Has patient had a PCN reaction causing severe rash involving mucus membranes or skin necrosis: No Has patient had a PCN reaction that required hospitalization Yes Has patient had a PCN reaction occurring within the last 10 years: No If all of the above  answers are "NO", then may proceed with Cephalosporin use.    Silver Dermatitis, Hives, Itching, Rash and Swelling    Causes blistering wounds   Sulfa Antibiotics Rash    Stevens-Johnson rash   Sulfamethoxazole     Other Reaction(s): Stevens-Johnson Syndrome   Vancomycin Other (See Comments) and Rash    RED MAN SYNDROME    CAN HAVE IF GIVEN OVER 2HOURS  Other Reaction(s): Other (See Comments), stevens-johnson syndrome  RED MAN SYNDROME      CAN HAVE IF GIVEN OVER 2HOURS  rash    RED MAN SYNDROME   CAN HAVE IF GIVEN OVER 2HOURS    RED MAN SYNDROME    CAN HAVE IF GIVEN OVER 2HOURS    rash RED MAN SYNDROME   CAN HAVE IF GIVEN OVER 2HOURS RED MAN SYNDROME   CAN HAVE IF GIVEN OVER 2HOURS RED MAN SYNDROME   CAN HAVE IF GIVEN OVER 2HOURS   Citalopram Other (See Comments)    Dystonia  Other reaction(s): Other (See Comments) Dystonia    Other reaction(s): Other (See Comments) Dystonia Dystonia Dystonia   Nortriptyline Other (See Comments)    Dry mouth at 25 mg dose.  Tolerates 10 mg dose  Other reaction(s): Other (See Comments)  Dry Mouth  Other Reaction(s): Other (See Comments)  Dry Mouth at 25 mg dose. Tolerates 10 mg dose.    Dry mouth at 25 mg dose.  Tolerates 10 mg dose    Dry Mouth    Dry mouth at 25 mg dose.  Tolerates 10 mg dose Other reaction(s): Other (See Comments) Dry Mouth    Dry mouth at 25 mg dose.  Tolerates 10 mg dose Dry Mouth at 25 mg dose. Tolerates 10 mg dose. Dry mouth at 25 mg dose.  Tolerates 10 mg dose   Pregabalin Other (See Comments)    Ineffective  Other reaction(s): Other (See Comments)  Tardive dyskinesia  Other Reaction(s): Other (See Comments)  Ineffective    Tardive dyskinesia    Ineffective Other reaction(s): Other (See Comments) Tardive dyskinesia    Ineffective Ineffective   Ibuprofen Other (See Comments)    Contraindicated with Xarelto.    Sulfacetamide Sodium-Sulfur Rash    ROS: Negative except as per HPI  above  Objective:  General: AAO x3, NAD  Dermatological: Nails with mild dystrophy subungual debris and abnormal growth bilateral foot no pain on palpation of the nails  Vascular:  Dorsalis Pedis artery and Posterior Tibial artery pedal pulses are 2/4  bilateral.  Capillary fill time < 3 sec to all digits.   Neruologic: Grossly intact via light touch bilateral. Protective threshold intact to all sites bilateral.   Musculoskeletal: Pain on palpation of the posterior heel just proximal to the Achilles tendon insertion bilaterally left worse than right.  Osseous prominence is noted at these locations.  Positive Silfverskiold test with decreased ankle dorsiflexion range of motion present bilaterally  Gait: Unassisted, Nonantalgic.   No images are attached to the encounter.  Radiographs:  Date: 12/19/2022 XR bilateral ankle weightbearing AP/Lateral/Oblique   Findings: Tension on the lateral view there is noted to be prominence of the posterior superior aspect of the calcaneus consistent with Haglund's deformity.  No fracture identified no significant insertional calcaneal spurring or calcification Assessment:   1. Haglund deformity of both heels   2. Achilles tendinitis of both lower extremities   3. Nail dystrophy   4. Haglund's deformity of left heel      Plan:  Patient was evaluated and treated and all questions answered.  # Haglund formerly bilateral lower extremity -Recommend heel lifts for the patient and dispensed a pair of these to the patient at this visit -Recommend stretching and therapy exercises for Achilles tendinitis bilateral lower extremity provided patient exercises for home -Recommend night splint and dispensed 1 night splint to wear on the left lower extremity to the patient -E Rx for methylprednisone 4 mg steroid taper pack take as directed for 6 days -Patient not able to take NSAIDs but do recommend using ice for anti-inflammatory effect  # Onychomycosis of the  nails bilateral foot -Will send nail sample to assess if this is truly a fungal infection versus just dystrophy due to trauma -Will consider medical treatment at next appointment when he follows up for the Achilles pain pending the results of the nail biopsy  Return in about 4 weeks (around 01/16/2023) for f/u achilles tendinitis.          Corinna Gab, DPM [facility]

## 2022-12-20 ENCOUNTER — Other Ambulatory Visit (HOSPITAL_BASED_OUTPATIENT_CLINIC_OR_DEPARTMENT_OTHER): Payer: Self-pay

## 2022-12-22 ENCOUNTER — Other Ambulatory Visit (HOSPITAL_BASED_OUTPATIENT_CLINIC_OR_DEPARTMENT_OTHER): Payer: Self-pay

## 2022-12-22 DIAGNOSIS — H903 Sensorineural hearing loss, bilateral: Secondary | ICD-10-CM | POA: Diagnosis not present

## 2022-12-22 DIAGNOSIS — H6123 Impacted cerumen, bilateral: Secondary | ICD-10-CM | POA: Diagnosis not present

## 2022-12-22 DIAGNOSIS — H608X3 Other otitis externa, bilateral: Secondary | ICD-10-CM | POA: Diagnosis not present

## 2022-12-22 MED ORDER — RIVAROXABAN 20 MG PO TABS
20.0000 mg | ORAL_TABLET | Freq: Every evening | ORAL | 3 refills | Status: DC
  Filled 2022-12-22: qty 90, 90d supply, fill #0
  Filled 2023-02-13 – 2023-03-25 (×2): qty 90, 90d supply, fill #1
  Filled 2023-06-19: qty 90, 90d supply, fill #2
  Filled 2023-09-15: qty 90, 90d supply, fill #3

## 2022-12-23 ENCOUNTER — Other Ambulatory Visit (HOSPITAL_COMMUNITY): Payer: Self-pay

## 2022-12-23 ENCOUNTER — Encounter: Payer: Self-pay | Admitting: Physical Medicine & Rehabilitation

## 2022-12-23 DIAGNOSIS — F411 Generalized anxiety disorder: Secondary | ICD-10-CM | POA: Diagnosis not present

## 2022-12-23 DIAGNOSIS — R748 Abnormal levels of other serum enzymes: Secondary | ICD-10-CM | POA: Diagnosis not present

## 2022-12-23 DIAGNOSIS — F331 Major depressive disorder, recurrent, moderate: Secondary | ICD-10-CM | POA: Diagnosis not present

## 2022-12-25 ENCOUNTER — Other Ambulatory Visit: Payer: Self-pay

## 2022-12-25 ENCOUNTER — Other Ambulatory Visit (HOSPITAL_BASED_OUTPATIENT_CLINIC_OR_DEPARTMENT_OTHER): Payer: Self-pay

## 2022-12-29 ENCOUNTER — Other Ambulatory Visit: Payer: Self-pay

## 2022-12-29 DIAGNOSIS — M2550 Pain in unspecified joint: Secondary | ICD-10-CM | POA: Diagnosis not present

## 2022-12-29 DIAGNOSIS — L409 Psoriasis, unspecified: Secondary | ICD-10-CM | POA: Diagnosis not present

## 2022-12-29 DIAGNOSIS — L4050 Arthropathic psoriasis, unspecified: Secondary | ICD-10-CM | POA: Diagnosis not present

## 2022-12-29 NOTE — Progress Notes (Signed)
Specialty Pharmacy Refill Coordination Note  Jesse Bowers is a 57 y.o. adult contacted today regarding refills of specialty medication(s) Secukinumab   Patient requested [name] at [facility] at [place] date: 01/20/23   Medication will be filled on 01/19/23.

## 2022-12-30 ENCOUNTER — Encounter: Payer: Self-pay | Admitting: Physical Medicine & Rehabilitation

## 2022-12-30 ENCOUNTER — Ambulatory Visit
Admission: RE | Admit: 2022-12-30 | Discharge: 2022-12-30 | Disposition: A | Discharge status: Home / Self Care | Payer: Commercial Managed Care - PPO | Source: Ambulatory Visit | Attending: Physician Assistant | Admitting: Physician Assistant

## 2022-12-30 DIAGNOSIS — R1310 Dysphagia, unspecified: Secondary | ICD-10-CM | POA: Diagnosis not present

## 2023-01-01 ENCOUNTER — Other Ambulatory Visit (HOSPITAL_BASED_OUTPATIENT_CLINIC_OR_DEPARTMENT_OTHER): Payer: Self-pay

## 2023-01-01 ENCOUNTER — Other Ambulatory Visit: Payer: Self-pay

## 2023-01-01 MED ORDER — MUPIROCIN 2 % EX OINT
1.0000 | TOPICAL_OINTMENT | Freq: Two times a day (BID) | CUTANEOUS | 0 refills | Status: DC
  Filled 2023-01-01: qty 22, 14d supply, fill #0

## 2023-01-02 DIAGNOSIS — F411 Generalized anxiety disorder: Secondary | ICD-10-CM | POA: Diagnosis not present

## 2023-01-02 DIAGNOSIS — F331 Major depressive disorder, recurrent, moderate: Secondary | ICD-10-CM | POA: Diagnosis not present

## 2023-01-04 ENCOUNTER — Other Ambulatory Visit (HOSPITAL_BASED_OUTPATIENT_CLINIC_OR_DEPARTMENT_OTHER): Payer: Self-pay

## 2023-01-05 ENCOUNTER — Other Ambulatory Visit: Payer: Self-pay

## 2023-01-05 ENCOUNTER — Other Ambulatory Visit (HOSPITAL_BASED_OUTPATIENT_CLINIC_OR_DEPARTMENT_OTHER): Payer: Self-pay

## 2023-01-05 DIAGNOSIS — D751 Secondary polycythemia: Secondary | ICD-10-CM | POA: Diagnosis not present

## 2023-01-06 ENCOUNTER — Encounter: Payer: Self-pay | Admitting: Podiatry

## 2023-01-14 DIAGNOSIS — F411 Generalized anxiety disorder: Secondary | ICD-10-CM | POA: Diagnosis not present

## 2023-01-14 DIAGNOSIS — F331 Major depressive disorder, recurrent, moderate: Secondary | ICD-10-CM | POA: Diagnosis not present

## 2023-01-15 ENCOUNTER — Other Ambulatory Visit: Payer: Self-pay | Admitting: Cardiovascular Disease

## 2023-01-15 ENCOUNTER — Other Ambulatory Visit (HOSPITAL_BASED_OUTPATIENT_CLINIC_OR_DEPARTMENT_OTHER): Payer: Self-pay

## 2023-01-15 MED ORDER — FUROSEMIDE 20 MG PO TABS
20.0000 mg | ORAL_TABLET | Freq: Every day | ORAL | 3 refills | Status: DC | PRN
  Filled 2023-01-15: qty 90, 90d supply, fill #0
  Filled 2023-02-25 – 2023-07-20 (×2): qty 90, 90d supply, fill #1

## 2023-01-15 MED ORDER — HALOBETASOL PROPIONATE 0.05 % EX OINT
1.0000 | TOPICAL_OINTMENT | Freq: Two times a day (BID) | CUTANEOUS | 5 refills | Status: DC
Start: 2023-01-15 — End: 2023-05-29
  Filled 2023-01-15: qty 50, 30d supply, fill #0
  Filled 2023-02-17: qty 50, 30d supply, fill #1

## 2023-01-16 ENCOUNTER — Encounter: Payer: Self-pay | Admitting: Family Medicine

## 2023-01-16 ENCOUNTER — Ambulatory Visit (INDEPENDENT_AMBULATORY_CARE_PROVIDER_SITE_OTHER): Payer: Commercial Managed Care - PPO | Admitting: Podiatry

## 2023-01-16 ENCOUNTER — Ambulatory Visit (INDEPENDENT_AMBULATORY_CARE_PROVIDER_SITE_OTHER): Payer: Commercial Managed Care - PPO | Admitting: Family Medicine

## 2023-01-16 ENCOUNTER — Encounter: Payer: Self-pay | Admitting: Podiatry

## 2023-01-16 VITALS — BP 108/80 | HR 88 | Temp 97.7°F | Ht 67.0 in | Wt 206.1 lb

## 2023-01-16 DIAGNOSIS — R4189 Other symptoms and signs involving cognitive functions and awareness: Secondary | ICD-10-CM

## 2023-01-16 DIAGNOSIS — D751 Secondary polycythemia: Secondary | ICD-10-CM | POA: Diagnosis not present

## 2023-01-16 DIAGNOSIS — Z820 Family history of epilepsy and other diseases of the nervous system: Secondary | ICD-10-CM | POA: Diagnosis not present

## 2023-01-16 DIAGNOSIS — M9261 Juvenile osteochondrosis of tarsus, right ankle: Secondary | ICD-10-CM | POA: Diagnosis not present

## 2023-01-16 DIAGNOSIS — H02843 Edema of right eye, unspecified eyelid: Secondary | ICD-10-CM

## 2023-01-16 DIAGNOSIS — F411 Generalized anxiety disorder: Secondary | ICD-10-CM | POA: Diagnosis not present

## 2023-01-16 DIAGNOSIS — M9262 Juvenile osteochondrosis of tarsus, left ankle: Secondary | ICD-10-CM

## 2023-01-16 DIAGNOSIS — L603 Nail dystrophy: Secondary | ICD-10-CM | POA: Diagnosis not present

## 2023-01-16 DIAGNOSIS — F331 Major depressive disorder, recurrent, moderate: Secondary | ICD-10-CM | POA: Diagnosis not present

## 2023-01-16 NOTE — Progress Notes (Unsigned)
Established Patient Office Visit  Subjective   Patient ID: Jesse Bowers, adult    DOB: April 02, 1965  Age: 57 y.o. MRN: [id number]  Chief Complaint  Patient presents with   Memory Loss    x2015   Eye Problem    Patient complains of a "knot" above the right eye since he fell 2 weeks ago and landed onto the face    Patient states that he fell after tripping and fell , hit right brow area, now there is a nodule, somewhat still tender, had some bruising along the eye brow. No vision changes. States that it is more swollen in the morning. States he had the dermatologist look at it and he was told it might be "fat necrosis" from the trauma. Eye itself is ok. States that he does already have an eye doctor - Dr. Dione Booze is his eye doctor.   Patient also wants to talk about memory issues, states that it has been going on since he had his TIA and CVA's several years ago. States that he was diagnosed with mild cognitive impairment. States that his mother also has Alzheimers' dementia and they just had to put her in the memory care nursing facility. Has a long family history of Alzheimer's dementia. Patient thinks that his memory may be getting worse over time. Also his PHQ has increased recently, pt already has a psychiatrist he is seeing in [facility].  Patient states that he was diagnosed with central sleep apnea -- states that he was potentially going to get a device called "remede" to help improve this. Is going to get this device soon to help with this problem.       Current Outpatient Medications  Medication Instructions   acyclovir ointment (ZOVIRAX) 5 % Apply topically every 3 (three) hours. For acute breakout of cold sores for 7 days.   baclofen (LIORESAL) 20-40 mg, Oral, 4 times daily, 1 tablet with breakfast, lunch, and dinner and 2 tablets at bedtime   calcium carbonate (OS-CAL) 1250 (500 Ca) MG chewable tablet 1 tablet, Oral, Daily   desonide (DESOWEN) 0.05 % cream Apply to affected  area(s) twice daily   desvenlafaxine (PRISTIQ) 100 MG 24 hr tablet Take 1 tablet (100 mg total) by mouth daily. (total dose 150mg )   folic acid (KP FOLIC ACID) 1 MG tablet Take 1 tablet po every day   folic acid (KP FOLIC ACID) 1 mg, Oral, Daily   furosemide (LASIX) 80 mg, Oral, Daily   furosemide (LASIX) 20 mg, Oral, Daily PRN   halobetasol (ULTRAVATE) 0.05 % ointment Apply 1 Application topically 2 (two) times daily to hands, feet and knees   hydrOXYzine (ATARAX) 25 MG tablet Take 1 tablet (25 mg total) by mouth 4 (four) times daily as needed for anxiety   Ibsrela 50 mg, Oral, 2 times daily   Loteprednol Etabonate (LOTEMAX SM) 0.38 % GEL 1 drop, Both Eyes, 3 times daily   metFORMIN (GLUCOPHAGE) 500 mg, Oral, 2 times daily with meals   methotrexate (RHEUMATREX) 15 mg, Oral, Weekly   metoprolol tartrate (LOPRESSOR) 25 mg, Oral, 2 times daily PRN   morphine (MS CONTIN) 30 mg, Oral, Every 12 hours   mupirocin ointment (BACTROBAN) 2 % Apply 1 Application topically to affected area of nose 2 (two) times daily for 14 days.   OVER THE COUNTER MEDICATION Tums-1000mg  once a day   Oxycodone HCl 10 mg, Oral, Every 12 hours PRN   Pancrelipase, Lip-Prot-Amyl, (CREON) 24000-76000 units CPEP Take 2  capsules by mouth in the morning and 2 capsules at noon and 2 capsules in the evening. Take with meals.   Pancrelipase, Lip-Prot-Amyl, (CREON) 24000-76000 units CPEP Oral   pantoprazole (PROTONIX) 40 MG tablet Take 1 tablet (40 mg total) by mouth every morning before breakfast.   [START ON 02/10/2023] phentermine (ADIPEX-P) 37.5 mg, Oral, Daily before breakfast, Or 1/2 tablet twice a day.   phentermine (ADIPEX-P) 37.5 mg, Oral, Daily before breakfast, Or 1/2 tablet twice a day.   phentermine (ADIPEX-P) 37.5 mg, Oral, Daily before breakfast, Or 1/2 tablet twice a day.   potassium chloride (KLOR-CON) 10 MEQ tablet Take 2 tablets by mouth every day.   RABEprazole (ACIPHEX) 20 MG tablet Take 1 tablet (20 mg total)  by mouth in the morning AND 1 tablet (20 mg total) every evening. Take before meals.   Secukinumab, 300 MG Dose, (COSENTYX SENSOREADY, 300 MG,) 150 MG/ML SOAJ Inject 2(two) pens subcutaneous every 4 weeks   Testosterone 25 MG/2.5GM (1%) GEL Apply a pea-sized amount topically to desired area once daily.   testosterone cypionate (DEPOTESTOSTERONE CYPIONATE) 100 mg, Intramuscular, Weekly   topiramate (TOPAMAX) 50 mg, Oral, 2 times daily   ursodiol (ACTIGALL) 500 mg, Oral, 2 times daily   ursodiol (ACTIGALL) 250 mg, Oral, 2 times daily   Vitamin D (Ergocalciferol) (DRISDOL) 50,000 Units, Oral, Weekly   Xarelto 20 mg, Oral, (Dosepack) Nightly - one time    Patient Active Problem List   Diagnosis Date Noted   Thoracic spine pain 12/10/2022   Bilateral lumbar radiculopathy 10/31/2022   Lumbar disc disease 08/06/2022   Iliotibial band syndrome 08/06/2022   Non-recurrent acute suppurative otitis media of both ears without spontaneous rupture of tympanic membranes 07/09/2022   Weight gain due to medication 07/09/2022   Pancreatic insufficiency 04/09/2022   Posterior vitreous detachment of right eye 11/18/2021   Spondylosis of lumbar spine 10/03/2020   Pseudophakia of both eyes 05/07/2020   Retinal telangiectasis of both eyes 05/07/2020   Lattice degeneration of peripheral retina, left 09/08/2019   Lattice degeneration, right eye 09/08/2019   Chronic diastolic heart failure (HCC) 05/24/2019   Urinary dysfunction 04/12/2019   Osteoarthritis of right knee 08/23/2018   Constipation due to opioid therapy 03/30/2018   SNHL (sensorineural hearing loss) 12/04/2017   Abnormal urinary stream 12/03/2017   Osteoarthritis of carpometacarpal (CMC) joint of thumb 11/30/2017   Muscle weakness 11/17/2017   Transient vision disturbance 11/12/2017   Bilateral hand pain 10/30/2017   Pain of left hip joint 10/09/2017   Gynecomastia 07/10/2017   Iron deficiency anemia 07/05/2017   Spasticity 05/20/2017    Lumbar radiculitis 04/20/2017   Ulnar 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, left 11/28/2016   Idiopathic peripheral neuropathy 11/28/2016   Failed total knee arthroplasty, 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   Long term (current) use of anticoagulants 08/23/2015   Right upper quadrant abdominal pain 08/23/2015   Memory loss 05/10/2015   Gait abnormality 04/07/2015   Alkaline phosphatase elevation 04/07/2015   History of thrombosis 03/26/2015   Medial epicondylitis 02/07/2015   Cognitive decline 12/21/2014   Leukopenia 12/05/2014   Rotator cuff syndrome of right shoulder 10/27/2014   Status post left knee replacement 08/22/2014   Left lateral epicondylitis 08/22/2014   Chronic cerebral ischemia 08/18/2014   Arthrofibrosis of knee joint 08/17/2014   Cubital canal compression syndrome, left 08/17/2014   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   Chronic pain syndrome 04/10/2014   Insomnia 04/10/2014   Chronic non-specific white matter lesions on MRI 04/10/2014   CFS (chronic fatigue  syndrome) 04/10/2014   Biceps tendonitis on left 03/01/2014   Polycythemia vera (HCC) 12/27/2013   H/O TIA (transient ischemic attack) and stroke 12/27/2013   Neck pain 12/27/2013   OSA (obstructive sleep apnea) 12/08/2013   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/31/2013   Protein C deficiency (HCC) 08/01/2013   Post traumatic stress disorder (PTSD) 08/01/2013   Speech abnormality 07/25/2013   Obesity 05/10/2013   Lower extremity edema 05/10/2013   GERD (gastroesophageal reflux disease) 05/10/2013   Arthritis 05/10/2013   OA (osteoarthritis) of knee 03/15/2013   Headache 10/25/2012   Palpitations 10/18/2012   Fatty liver determined by biopsy 06/01/2012   Arthropathic psoriasis, unspecified (HCC) 04/29/2012   Abnormal liver enzymes 03/29/2012   Psoriatic arthritis (HCC) 12/29/2011   Left Renal Hydronephrosis 12/11/2010   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     Objective:     BP 108/80 (BP Location: Left Arm, Patient Position: Sitting, Cuff Size: Large)   Pulse 88   Temp 97.7 F (36.5 C) (Oral)   Ht 5\' 7"  (1.702 m)   Wt 206 lb 1.6 oz (93.5 kg)   SpO2 98%   BMI 32.28 kg/m  {Vitals History (Optional):23777}  Physical Exam Vitals reviewed.  Constitutional:      Appearance: Normal appearance. He is obese.  Eyes:     Extraocular Movements: Extraocular movements intact.     Conjunctiva/sclera: Conjunctivae normal.     Pupils: Pupils are equal, round, and reactive to light.  Neurological:     Mental Status: He is alert.      No results found for any visits on 01/16/23.  {Labs (Optional):23779}  The ASCVD Risk score (Arnett DK, et al., 2019) failed to calculate for the following reasons:   The patient has a prior MI or stroke diagnosis    Assessment & Plan:  Cognitive decline  Family history of Alzheimer's disease   After researching these tests-- the [id number] and the amyloid testing is not covered by insurance due to being "experimental test" that are not FDA approved.  No follow-ups on file.    Karie Georges, MD

## 2023-01-16 NOTE — Progress Notes (Signed)
Subjective:  Patient ID: Jesse Bowers, adult    DOB: January 20, 1966,  MRN: [id number]  Chief Complaint  Patient presents with   Foot Pain    Patient is here for tendonitis patient states does not feel any difference with medication, brace is helping, possible injection for pain    57 y.o. adult presents for follow-up of bilateral Achilles pain.  Previous diagnosed with Haglund's deformity and Achilles tendinitis.  Has been using night splint which is helping with his pain.  He says that he started using a left foot and had pain on the right and is hoping to get 1 for the right side.  Has been doing stretching exercises.  Took a steroid Dosepak helped a little bit though not fully effective.  Past Medical History:  Diagnosis Date   Abnormal weight loss    Anxiety    Arthritis    Cataract    OU   Celiac disease    Cervical neck pain with evidence of disc disease    patient has a cyst    Chronic constipation    Chronic diastolic heart failure (HCC)    Pt. 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   Family history of adverse reaction to anesthesia    family has problems with anesthesia of nausea and vomiting    GERD (gastroesophageal reflux disease)    History of in 20's   Gluten enteropathy    H/O parotitis    right    Hard of hearing    History of kidney stones    History of retinal tear    Bilateral   History of staph infection    required wound vac   Hx-TIA (transient ischemic attack)    2015   Kidney stones 08/2020   LVH (left ventricular hypertrophy) 12/15/2016   Mild, noted on ECHO   MVP (mitral valve prolapse)    NAFL (nonalcoholic fatty liver)    Neck pain    Neuromuscular disorder (HCC)    bilateral neuropathy feet.   Nuclear sclerotic  cataract of both eyes 09/08/2019   Pneumonia 12/17/2010   Polycythemia    Polycythemia, secondary    PONV (postoperative nausea and vomiting)    Protein C deficiency (HCC)    Dr. Thomes Cake   Psoriasis    16 X10 cm psoriatic rash on sole of left foot ; open and occ scant bleeding;    psoriatic arthritis    PTSD (post-traumatic stress disorder)    Scaphoid fracture of wrist 09/23/2013   Seizure (HCC)    childhood, medication until age 26 then weanned completely off   Sleep apnea    split night study last done by Dr. Epimenio Foot 06/18/15 shows severe OSA, CSA, and hypersomnia, rec bipap   Splenomegaly    Stenosis of ureteropelvic junction (UPJ)    left   Stroke [facility])    CVA vs TIA in left cerebrum causing slight right sided weakness-Dr. Epimenio Foot follows   Syrinx of spinal cord (HCC) 01/06/2014   c spine on MRI   Tachycardia    hx of    Transfusion history    past history- none recent, after surgeries due to blood loss   Transgender with history  of gender affirmation surgery    Wears glasses    Wears hearing aid     Allergies  Allergen Reactions   Gabapentin Nausea Only and Other (See Comments)    Other reaction(s): nausea, mental status  Drowsiness and restlessness. Pt. States, " It makes me crazy, I can't take this medicine."  Other reaction(s): Other (See Comments)  Tardive dyskinesia  Other Reaction(s): Other (See Comments)  Drowsiness and restlessness.   Misc. Sulfonamide Containing Compounds Rash and Other (See Comments)    Stevens-Johnson rash   Penicillin G Anaphylaxis and Other (See Comments)    Has patient had a PCN reaction causing immediate rash, facial/tongue/throat swelling, SOB or lightheadedness with hypotension: Yes Has patient had a PCN reaction causing severe rash involving mucus membranes or skin necrosis: No Has patient had a PCN reaction that required hospitalization Yes Has patient had a PCN reaction occurring within the last 10 years: No If all of the  above answers are "NO", then may proceed with Cephalosporin use.    Silver Dermatitis, Hives, Itching, Rash and Swelling    Causes blistering wounds   Sulfa Antibiotics Rash    Stevens-Johnson rash   Sulfamethoxazole     Other Reaction(s): Stevens-Johnson Syndrome   Vancomycin Other (See Comments) and Rash    RED MAN SYNDROME    CAN HAVE IF GIVEN OVER 2HOURS  Other Reaction(s): Other (See Comments), stevens-johnson syndrome  RED MAN SYNDROME      CAN HAVE IF GIVEN OVER 2HOURS  rash    RED MAN SYNDROME   CAN HAVE IF GIVEN OVER 2HOURS    RED MAN SYNDROME    CAN HAVE IF GIVEN OVER 2HOURS    rash RED MAN SYNDROME   CAN HAVE IF GIVEN OVER 2HOURS RED MAN SYNDROME   CAN HAVE IF GIVEN OVER 2HOURS RED MAN SYNDROME   CAN HAVE IF GIVEN OVER 2HOURS   Citalopram Other (See Comments)    Dystonia  Other reaction(s): Other (See Comments) Dystonia    Other reaction(s): Other (See Comments) Dystonia Dystonia Dystonia   Nortriptyline Other (See Comments)    Dry mouth at 25 mg dose.  Tolerates 10 mg dose  Other reaction(s): Other (See Comments)  Dry Mouth  Other Reaction(s): Other (See Comments)  Dry Mouth at 25 mg dose. Tolerates 10 mg dose.    Dry mouth at 25 mg dose.  Tolerates 10 mg dose    Dry Mouth    Dry mouth at 25 mg dose.  Tolerates 10 mg dose Other reaction(s): Other (See Comments) Dry Mouth    Dry mouth at 25 mg dose.  Tolerates 10 mg dose Dry Mouth at 25 mg dose. Tolerates 10 mg dose. Dry mouth at 25 mg dose.  Tolerates 10 mg dose   Pregabalin Other (See Comments)    Ineffective  Other reaction(s): Other (See Comments)  Tardive dyskinesia  Other Reaction(s): Other (See Comments)  Ineffective    Tardive dyskinesia    Ineffective Other reaction(s): Other (See Comments) Tardive dyskinesia    Ineffective Ineffective   Ibuprofen Other (See Comments)    Contraindicated with Xarelto.    Sulfacetamide Sodium-Sulfur Rash    ROS: Negative except as per HPI  above  Objective:  General: AAO x3, NAD  Dermatological: Nails with mild dystrophy subungual debris and abnormal growth bilateral foot no pain on palpation of the nails  Vascular:  Dorsalis Pedis artery and Posterior Tibial artery pedal pulses are 2/4 bilateral.  Capillary fill time < 3 sec  to all digits.   Neruologic: Grossly intact via light touch bilateral. Protective threshold intact to all sites bilateral.   Musculoskeletal: Decreased pain on palpation of the posterior heel just proximal to the Achilles tendon insertion bilaterally left worse than right.  Osseous prominence is noted at these locations.  Positive Silfverskiold test with decreased ankle dorsiflexion range of motion present bilaterally  Gait: Unassisted, Nonantalgic.   No images are attached to the encounter.  Radiographs:  Date: 12/19/2022 XR bilateral ankle weightbearing AP/Lateral/Oblique   Findings: Tension on the lateral view there is noted to be prominence of the posterior superior aspect of the calcaneus consistent with Haglund's deformity.  No fracture identified no significant insertional calcaneal spurring or calcification Assessment:   1. Haglund deformity of both heels   2. Nail dystrophy       Plan:  Patient was evaluated and treated and all questions answered.  # Haglund formerly bilateral lower extremity -Continue conservative modalities including stretching icing rest anti-inflammatories as needed -Dispensed a second night splint for the patient this visit -If no improvement or gets worse we will have to consider additional steroid pack versus surgical intervention  # Onychomycosis of the nails bilateral foot -Nail sample without evidence of fungal infection -Recommend protecting the toenail to prevent microtrauma.  -Do not recommend topical or oral antifungal -Also discussed laser therapy patient will consider if he wants to proceed he will call  Return if symptoms worsen or fail to  improve.          Corinna Gab, DPM [facility] / [facility]

## 2023-01-20 ENCOUNTER — Other Ambulatory Visit (HOSPITAL_BASED_OUTPATIENT_CLINIC_OR_DEPARTMENT_OTHER): Payer: Self-pay

## 2023-01-20 ENCOUNTER — Other Ambulatory Visit: Payer: Self-pay

## 2023-01-21 ENCOUNTER — Other Ambulatory Visit (HOSPITAL_BASED_OUTPATIENT_CLINIC_OR_DEPARTMENT_OTHER): Payer: Self-pay

## 2023-01-21 ENCOUNTER — Other Ambulatory Visit (HOSPITAL_COMMUNITY): Payer: Self-pay

## 2023-01-21 DIAGNOSIS — F411 Generalized anxiety disorder: Secondary | ICD-10-CM | POA: Diagnosis not present

## 2023-01-21 DIAGNOSIS — F331 Major depressive disorder, recurrent, moderate: Secondary | ICD-10-CM | POA: Diagnosis not present

## 2023-01-22 ENCOUNTER — Other Ambulatory Visit: Payer: Self-pay

## 2023-01-22 ENCOUNTER — Other Ambulatory Visit (HOSPITAL_BASED_OUTPATIENT_CLINIC_OR_DEPARTMENT_OTHER): Payer: Self-pay

## 2023-01-22 ENCOUNTER — Ambulatory Visit: Payer: Commercial Managed Care - PPO | Admitting: Podiatry

## 2023-01-22 DIAGNOSIS — H608X3 Other otitis externa, bilateral: Secondary | ICD-10-CM | POA: Diagnosis not present

## 2023-01-22 DIAGNOSIS — H903 Sensorineural hearing loss, bilateral: Secondary | ICD-10-CM | POA: Diagnosis not present

## 2023-01-22 DIAGNOSIS — L299 Pruritus, unspecified: Secondary | ICD-10-CM | POA: Diagnosis not present

## 2023-01-22 DIAGNOSIS — H6123 Impacted cerumen, bilateral: Secondary | ICD-10-CM | POA: Diagnosis not present

## 2023-01-22 MED ORDER — FLUOCINOLONE ACETONIDE 0.01 % OT OIL
TOPICAL_OIL | OTIC | 1 refills | Status: DC
  Filled 2023-01-22: qty 20, 30d supply, fill #0
  Filled 2023-02-25: qty 20, 30d supply, fill #1

## 2023-01-23 ENCOUNTER — Other Ambulatory Visit: Payer: Self-pay

## 2023-01-23 DIAGNOSIS — F411 Generalized anxiety disorder: Secondary | ICD-10-CM | POA: Diagnosis not present

## 2023-01-23 DIAGNOSIS — F331 Major depressive disorder, recurrent, moderate: Secondary | ICD-10-CM | POA: Diagnosis not present

## 2023-01-24 ENCOUNTER — Other Ambulatory Visit (HOSPITAL_BASED_OUTPATIENT_CLINIC_OR_DEPARTMENT_OTHER): Payer: Self-pay

## 2023-01-27 ENCOUNTER — Other Ambulatory Visit: Payer: Self-pay | Admitting: Physical Medicine & Rehabilitation

## 2023-01-27 ENCOUNTER — Other Ambulatory Visit: Payer: Self-pay

## 2023-01-27 ENCOUNTER — Other Ambulatory Visit (HOSPITAL_BASED_OUTPATIENT_CLINIC_OR_DEPARTMENT_OTHER): Payer: Self-pay

## 2023-01-27 DIAGNOSIS — L4050 Arthropathic psoriasis, unspecified: Secondary | ICD-10-CM

## 2023-01-27 DIAGNOSIS — M47816 Spondylosis without myelopathy or radiculopathy, lumbar region: Secondary | ICD-10-CM

## 2023-01-27 MED ORDER — MORPHINE SULFATE ER 30 MG PO TBCR
30.0000 mg | EXTENDED_RELEASE_TABLET | Freq: Two times a day (BID) | ORAL | 0 refills | Status: DC
  Filled 2023-01-27: qty 60, 30d supply, fill #0

## 2023-01-27 MED ORDER — OXYCODONE HCL 10 MG PO TABS
10.0000 mg | ORAL_TABLET | Freq: Two times a day (BID) | ORAL | 0 refills | Status: DC | PRN
  Filled 2023-01-27: qty 60, 30d supply, fill #0

## 2023-01-28 ENCOUNTER — Other Ambulatory Visit (HOSPITAL_BASED_OUTPATIENT_CLINIC_OR_DEPARTMENT_OTHER): Payer: Self-pay

## 2023-01-28 ENCOUNTER — Other Ambulatory Visit: Payer: Self-pay

## 2023-01-28 DIAGNOSIS — F411 Generalized anxiety disorder: Secondary | ICD-10-CM | POA: Diagnosis not present

## 2023-01-28 DIAGNOSIS — F331 Major depressive disorder, recurrent, moderate: Secondary | ICD-10-CM | POA: Diagnosis not present

## 2023-02-02 DIAGNOSIS — F411 Generalized anxiety disorder: Secondary | ICD-10-CM | POA: Diagnosis not present

## 2023-02-02 DIAGNOSIS — F5101 Primary insomnia: Secondary | ICD-10-CM | POA: Diagnosis not present

## 2023-02-02 DIAGNOSIS — F649 Gender identity disorder, unspecified: Secondary | ICD-10-CM | POA: Diagnosis not present

## 2023-02-02 DIAGNOSIS — F09 Unspecified mental disorder due to known physiological condition: Secondary | ICD-10-CM | POA: Diagnosis not present

## 2023-02-02 DIAGNOSIS — F32A Depression, unspecified: Secondary | ICD-10-CM | POA: Diagnosis not present

## 2023-02-02 DIAGNOSIS — F4310 Post-traumatic stress disorder, unspecified: Secondary | ICD-10-CM | POA: Diagnosis not present

## 2023-02-03 ENCOUNTER — Other Ambulatory Visit: Payer: Self-pay

## 2023-02-03 ENCOUNTER — Other Ambulatory Visit (HOSPITAL_BASED_OUTPATIENT_CLINIC_OR_DEPARTMENT_OTHER): Payer: Self-pay

## 2023-02-03 ENCOUNTER — Other Ambulatory Visit: Payer: Self-pay | Admitting: Physical Medicine & Rehabilitation

## 2023-02-03 ENCOUNTER — Other Ambulatory Visit (HOSPITAL_BASED_OUTPATIENT_CLINIC_OR_DEPARTMENT_OTHER): Payer: Self-pay | Admitting: Cardiovascular Disease

## 2023-02-03 DIAGNOSIS — K900 Celiac disease: Secondary | ICD-10-CM | POA: Diagnosis not present

## 2023-02-03 DIAGNOSIS — K8689 Other specified diseases of pancreas: Secondary | ICD-10-CM | POA: Diagnosis not present

## 2023-02-03 DIAGNOSIS — D751 Secondary polycythemia: Secondary | ICD-10-CM | POA: Diagnosis not present

## 2023-02-03 DIAGNOSIS — G894 Chronic pain syndrome: Secondary | ICD-10-CM

## 2023-02-03 DIAGNOSIS — R112 Nausea with vomiting, unspecified: Secondary | ICD-10-CM | POA: Diagnosis not present

## 2023-02-03 DIAGNOSIS — M5416 Radiculopathy, lumbar region: Secondary | ICD-10-CM

## 2023-02-03 MED ORDER — FAMOTIDINE 40 MG PO TABS
40.0000 mg | ORAL_TABLET | Freq: Every evening | ORAL | 3 refills | Status: DC
  Filled 2023-02-03: qty 90, 90d supply, fill #0
  Filled 2023-05-01: qty 90, 90d supply, fill #1
  Filled 2023-07-27: qty 90, 90d supply, fill #2
  Filled 2023-10-26: qty 90, 90d supply, fill #3

## 2023-02-04 ENCOUNTER — Encounter: Payer: Self-pay | Admitting: Podiatry

## 2023-02-04 ENCOUNTER — Other Ambulatory Visit (HOSPITAL_BASED_OUTPATIENT_CLINIC_OR_DEPARTMENT_OTHER): Payer: Self-pay

## 2023-02-04 ENCOUNTER — Telehealth: Payer: Self-pay | Admitting: *Deleted

## 2023-02-04 ENCOUNTER — Other Ambulatory Visit: Payer: Self-pay

## 2023-02-04 DIAGNOSIS — F331 Major depressive disorder, recurrent, moderate: Secondary | ICD-10-CM | POA: Diagnosis not present

## 2023-02-04 MED ORDER — BACLOFEN 20 MG PO TABS
20.0000 mg | ORAL_TABLET | Freq: Four times a day (QID) | ORAL | 2 refills | Status: DC
Start: 2023-02-04 — End: 2023-12-22
  Filled 2023-02-04: qty 450, 100d supply, fill #0
  Filled 2023-05-25: qty 450, 100d supply, fill #1
  Filled 2023-08-31: qty 450, 100d supply, fill #2

## 2023-02-04 MED ORDER — DESVENLAFAXINE SUCCINATE ER 50 MG PO TB24
50.0000 mg | ORAL_TABLET | Freq: Every day | ORAL | 1 refills | Status: DC
  Filled 2023-02-04: qty 90, 90d supply, fill #0

## 2023-02-04 NOTE — Telephone Encounter (Signed)
   Name: Jesse Bowers  DOB: 1966/01/09  MRN: [id number]  Primary Cardiologist: Thurmon Fair, MD   Preoperative team, please contact this patient and set up a phone call appointment for further preoperative risk assessment. Please obtain consent and complete medication review. Thank you for your help.  He is on Xarelto given history of Protein C Deficiency with prior DVTs. This is followed by [facility] so will need to defer recommendations for holding this to them.   I also confirmed the patient resides in the state of [place]. As per [facility] Medical Board telemedicine laws, the patient must reside in the state in which the provider is licensed.   Corrin Parker, PA-C 02/04/2023, 2:06 PM [facility]

## 2023-02-04 NOTE — Telephone Encounter (Signed)
   Pre-operative Risk Assessment    Patient Name: Jesse Bowers  DOB: 1965-10-27 MRN: [id number]  DATE OF LAST VISIT: 11/21/22 DR. CROITORU DATE OF NEXT VISIT: NONE    Request for Surgical Clearance    Procedure:   RIGHT WRIST ARTHROSCOPY AND JOINT DEBRIDEMENT  AND REPAIR AS INDICATED  Date of Surgery:  Clearance 02/09/23                                 Surgeon:  DR. [facility] Surgeon's Group or Practice Name:  [facility] Phone number:  [phone] MEGAN DAVIS Fax number:  [phone]   Type of Clearance Requested:   - Medical  - Pharmacy:  Hold Rivaroxaban (Xarelto)     Type of Anesthesia:  Not Indicated   Additional requests/questions:    Elpidio Anis   02/04/2023, 1:55 PM

## 2023-02-05 ENCOUNTER — Encounter: Payer: Self-pay | Admitting: Physical Medicine & Rehabilitation

## 2023-02-06 ENCOUNTER — Other Ambulatory Visit (HOSPITAL_BASED_OUTPATIENT_CLINIC_OR_DEPARTMENT_OTHER): Payer: Self-pay

## 2023-02-06 ENCOUNTER — Other Ambulatory Visit: Payer: Self-pay

## 2023-02-06 DIAGNOSIS — Z789 Other specified health status: Secondary | ICD-10-CM | POA: Diagnosis not present

## 2023-02-06 DIAGNOSIS — Z79890 Hormone replacement therapy (postmenopausal): Secondary | ICD-10-CM | POA: Diagnosis not present

## 2023-02-06 DIAGNOSIS — Z5181 Encounter for therapeutic drug level monitoring: Secondary | ICD-10-CM | POA: Diagnosis not present

## 2023-02-06 DIAGNOSIS — F331 Major depressive disorder, recurrent, moderate: Secondary | ICD-10-CM | POA: Diagnosis not present

## 2023-02-06 MED ORDER — VITAMIN D (ERGOCALCIFEROL) 1.25 MG (50000 UNIT) PO CAPS
50000.0000 [IU] | ORAL_CAPSULE | ORAL | 0 refills | Status: DC
  Filled 2023-02-06: qty 12, 84d supply, fill #0

## 2023-02-06 MED ORDER — POTASSIUM CHLORIDE ER 10 MEQ PO TBCR
20.0000 meq | EXTENDED_RELEASE_TABLET | Freq: Every day | ORAL | 2 refills | Status: DC
  Filled 2023-02-06: qty 180, 90d supply, fill #0
  Filled 2023-05-11: qty 180, 90d supply, fill #1
  Filled 2023-08-04: qty 180, 90d supply, fill #2

## 2023-02-06 NOTE — Telephone Encounter (Signed)
I left a message for the patient to scheduled a tele visit for today 12/6 for pre=op clearance.

## 2023-02-07 ENCOUNTER — Other Ambulatory Visit (HOSPITAL_BASED_OUTPATIENT_CLINIC_OR_DEPARTMENT_OTHER): Payer: Self-pay

## 2023-02-08 ENCOUNTER — Other Ambulatory Visit: Payer: Self-pay | Admitting: Family Medicine

## 2023-02-08 DIAGNOSIS — E669 Obesity, unspecified: Secondary | ICD-10-CM

## 2023-02-09 ENCOUNTER — Other Ambulatory Visit: Payer: Self-pay

## 2023-02-09 ENCOUNTER — Telehealth: Payer: Self-pay | Admitting: *Deleted

## 2023-02-09 ENCOUNTER — Other Ambulatory Visit (HOSPITAL_BASED_OUTPATIENT_CLINIC_OR_DEPARTMENT_OTHER): Payer: Self-pay

## 2023-02-09 ENCOUNTER — Other Ambulatory Visit (HOSPITAL_COMMUNITY): Payer: Self-pay

## 2023-02-09 MED ORDER — PHENTERMINE HCL 37.5 MG PO TABS
37.5000 mg | ORAL_TABLET | Freq: Every day | ORAL | 0 refills | Status: DC
  Filled 2023-02-09: qty 30, 30d supply, fill #0

## 2023-02-09 NOTE — Telephone Encounter (Signed)
Pt has been scheduled tele pre op appt 02/11/23. Pt is in [place] right now as his mom has fallen and he is there caring for him mom right now. Per pre op APP today tele appt cannot be done until he is back in [place] due legal guidelines. Pt states he will be back Tuesday 02/10/23 PM. Pt agreed to tele pre op appt 02/11/23. Med rec and consent are done.    I will update the surgeon's office as well. Pt did tell me his procedure has been moved to 02/16/23.      Patient Consent for Virtual Visit        Kamdyn Catino has provided verbal consent on 02/09/2023 for a virtual visit (video or telephone).   CONSENT FOR VIRTUAL VISIT FOR:  Warren Lacy  By participating in this virtual visit I agree to the following:  I hereby voluntarily request, consent and authorize [facility] and its employed or contracted physicians, physician assistants, nurse practitioners or other licensed health care professionals (the Practitioner), to provide me with telemedicine health care services (the "Services") as deemed necessary by the treating Practitioner. I acknowledge and consent to receive the Services by the Practitioner via telemedicine. I understand that the telemedicine visit will involve communicating with the Practitioner through live audiovisual communication technology and the disclosure of certain medical information by electronic transmission. I acknowledge that I have been given the opportunity to request an in-person assessment or other available alternative prior to the telemedicine visit and am voluntarily participating in the telemedicine visit.  I understand that I have the right to withhold or withdraw my consent to the use of telemedicine in the course of my care at any time, without affecting my right to future care or treatment, and that the Practitioner or I may terminate the telemedicine visit at any time. I understand that I have the right to inspect all information obtained  and/or recorded in the course of the telemedicine visit and may receive copies of available information for a reasonable fee.  I understand that some of the potential risks of receiving the Services via telemedicine include:  Delay or interruption in medical evaluation due to technological equipment failure or disruption; Information transmitted may not be sufficient (e.g. poor resolution of images) to allow for appropriate medical decision making by the Practitioner; and/or  In rare instances, security protocols could fail, causing a breach of personal health information.  Furthermore, I acknowledge that it is my responsibility to provide information about my medical history, conditions and care that is complete and accurate to the best of my ability. I acknowledge that Practitioner's advice, recommendations, and/or decision may be based on factors not within their control, such as incomplete or inaccurate data provided by me or distortions of diagnostic images or specimens that may result from electronic transmissions. I understand that the practice of medicine is not an exact science and that Practitioner makes no warranties or guarantees regarding treatment outcomes. I acknowledge that a copy of this consent can be made available to me via my patient portal [facility] MyChart), or I can request a printed copy by calling the office of [facility].    I understand that my insurance will be billed for this visit.   I have read or had this consent read to me. I understand the contents of this consent, which adequately explains the benefits and risks of the Services being provided via telemedicine.  I have been provided ample opportunity  to ask questions regarding this consent and the Services and have had my questions answered to my satisfaction. I give my informed consent for the services to be provided through the use of telemedicine in my medical care

## 2023-02-09 NOTE — Progress Notes (Signed)
Specialty Pharmacy Refill Coordination Note  Jesse Bowers is a 57 y.o. adult contacted today regarding refills of specialty medication(s) Secukinumab   Patient requested Jesse Bowers at [facility] at [place] date: 02/17/23   Medication will be filled on 02/16/23.

## 2023-02-09 NOTE — Telephone Encounter (Signed)
Pt has been scheduled tele pre op appt 02/11/23. Pt is in [place] right now as his mom has fallen and he is there caring for him mom right now. Per pre op APP today tele appt cannot be done until he is back in [place] due legal guidelines. Pt states he will be back Tuesday 02/10/23 PM. Pt agreed to tele pre op appt 02/11/23. Med rec and consent are done.    I will update the surgeon's office as well. Pt did tell me his procedure has been moved to 02/16/23.

## 2023-02-09 NOTE — Progress Notes (Unsigned)
Virtual Visit via Telephone Note   Because of Jesse Bowers's co-morbid illnesses, he is at least at moderate risk for complications without adequate follow up.  This format is felt to be most appropriate for this patient at this time.  The patient did not have access to video technology/had technical difficulties with video requiring transitioning to audio format only (telephone).  All issues noted in this document were discussed and addressed.  No physical exam could be performed with this format.  Please refer to the patient's chart for his consent to telehealth for [facility].  Evaluation Performed:  Preoperative cardiovascular risk assessment _____________   Date:  02/11/2023   Patient ID:  Jesse Bowers, DOB October 03, 1965, MRN [id number] Patient Location:  Home Provider location:   Office  Primary Care Provider:  Karie Georges, MD Primary Cardiologist:  Thurmon Fair, MD  Chief Complaint / Patient Profile   57 y.o. y/o adult with a h/o chronic diastolic heart failure, obstructive sleep apnea on CPAP, history of previous stroke on chronic anticoagulation with Xarelto (protein C deficiency).  He  is pending right wrist arthroscopy and joint debridement and repair by Dr. Bradly Bienenstock on 02/09/2023, and presents today for telephonic preoperative cardiovascular risk assessment and recommendations concerning holding Xarelto.  History of Present Illness    Jesse Bowers is a 57 y.o. adult who presents via audio/video conferencing for a telehealth visit today.  Pt was last seen in cardiology clinic on 11/21/2022 by Dr. Royann Shivers.  At that time Jesse Bowers was doing well .  The patient is now pending procedure as outlined above. Since his last visit, he has not had any cardiac issues concerning fluid retention, DOE, or fatigue. He has spinal stenosis and has chronic pain severely limiting his exertional activity.  Past Medical History    Past  Medical History:  Diagnosis Date   Abnormal weight loss    Anxiety    Arthritis    Cataract    OU   Celiac disease    Cervical neck pain with evidence of disc disease    patient has a cyst    Chronic constipation    Chronic diastolic heart failure (HCC)    Pt. 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   Family history of adverse reaction to anesthesia    family has problems with anesthesia of nausea and vomiting    GERD (gastroesophageal reflux disease)    History of in 20's   Gluten enteropathy    H/O parotitis    right    Hard of hearing    History of kidney stones    History of retinal tear    Bilateral   History of staph infection    required wound vac   Hx-TIA (transient ischemic attack)    2015   Kidney stones 08/2020   LVH (left ventricular hypertrophy) 12/15/2016   Mild, noted on ECHO   MVP (mitral valve prolapse)    NAFL (nonalcoholic fatty liver)    Neck pain    Neuromuscular disorder (HCC)    bilateral neuropathy feet.   Nuclear sclerotic cataract of both eyes 09/08/2019   Pneumonia 12/17/2010   Polycythemia  Polycythemia, secondary    PONV (postoperative nausea and vomiting)    Protein C deficiency (HCC)    Dr. Thomes Cake   Psoriasis    16 X10 cm psoriatic rash on sole of left foot ; open and occ scant bleeding;    psoriatic arthritis    PTSD (post-traumatic stress disorder)    Scaphoid fracture of wrist 09/23/2013   Seizure (HCC)    childhood, medication until age 51 then weanned completely off   Sleep apnea    split night study last done by Dr. Epimenio Foot 06/18/15 shows severe OSA, CSA, and hypersomnia, rec bipap   Splenomegaly    Stenosis of ureteropelvic junction (UPJ)    left   Stroke [facility])    CVA vs TIA in left cerebrum  causing slight right sided weakness-Dr. 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    Wears glasses    Wears hearing aid    Past Surgical History:  Procedure Laterality Date   ABDOMINAL HYSTERECTOMY Bilateral 1994   TAH, BSO- tranverse incision at 57 yo   ANKLE ARTHROSCOPY WITH RECONSTRUCTION Right 2007   CHOLECYSTECTOMY     laparoscopic   COLONOSCOPY     x3   EYE SURGERY     Left eye 03/02/2018, right 02/15/2018   HEMORROIDECTOMY  08/19/2020   HIP ARTHROSCOPY W/ LABRAL REPAIR Right 05/11/2013   acetabular labral tear 03/30/2013   KNEE ARTHROPLASTY Right    KNEE JOINT MANIPULATION Left    x3 under anesthesia   KNEE SURGERY Bilateral 1984   Right ACL, left PCL repair   LITHOTRIPSY  2005   LIVER BIOPSY  2013   normal results.   MASTECTOMY Bilateral    prior to 2009   MOUTH SURGERY     NASAL SEPTUM SURGERY N/A 09/20/2015   by ENT Dr. Ezzard Standing   OVARIAN CYST SURGERY Left    size of grapefruit, was informed that she had shortened vagina   SHOULDER SURGERY Bilateral    Right 08/15/2016, Left 11/15/2016   THUMB ARTHROSCOPY Left    THYROIDECTOMY, PARTIAL Left 2008   TOTAL KNEE ARTHROPLASTY Right 08/23/2018   Procedure: TOTAL KNEE ARTHROPLASTY;  Surgeon: Ollen Gross, MD;  Location: [facility];  Service: Orthopedics;  Laterality: Right;    TOTAL KNEE REVISION Left 02/06/2016   Procedure: LEFT TOTAL KNEE REVISION;  Surgeon: Ollen Gross, MD;  Location: [facility];  Service: Orthopedics;  Laterality: Left;   TOTAL KNEE REVISION Left 04/22/2017   Procedure: Left knee polyethylene revision;  Surgeon: Ollen Gross, MD;  Location: [facility];  Service: Orthopedics;  Laterality: Left;   UPPER GI ENDOSCOPY  2003    Allergies  Allergies  Allergen Reactions   Gabapentin Nausea Only and Other  (See Comments)    Other reaction(s): nausea, mental status  Drowsiness and restlessness. Pt. States, " It makes me crazy, I can't take this medicine."  Other reaction(s): Other (See Comments)  Tardive dyskinesia  Other Reaction(s): Other (See Comments)  Drowsiness and restlessness.   Misc. Sulfonamide Containing Compounds Rash and Other (See Comments)    Stevens-Johnson rash   Penicillin G Anaphylaxis and Other (See Comments)    Has patient had a PCN reaction causing immediate rash, facial/tongue/throat swelling, SOB or lightheadedness  with hypotension: Yes Has patient had a PCN reaction causing severe rash involving mucus membranes or skin necrosis: No Has patient had a PCN reaction that required hospitalization Yes Has patient had a PCN reaction occurring within the last 10 years: No If all of the above answers are "NO", then may proceed with Cephalosporin use.    Silver Dermatitis, Hives, Itching, Rash and Swelling    Causes blistering wounds   Sulfa Antibiotics Rash    Stevens-Johnson rash   Sulfamethoxazole     Other Reaction(s): Stevens-Johnson Syndrome   Vancomycin Other (See Comments) and Rash    RED MAN SYNDROME    CAN HAVE IF GIVEN OVER 2HOURS  Other Reaction(s): Other (See Comments), stevens-johnson syndrome  RED MAN SYNDROME      CAN HAVE IF GIVEN OVER 2HOURS  rash    RED MAN SYNDROME   CAN HAVE IF GIVEN OVER 2HOURS    RED MAN SYNDROME    CAN HAVE IF GIVEN OVER 2HOURS    rash RED MAN SYNDROME   CAN HAVE IF GIVEN OVER 2HOURS RED MAN SYNDROME   CAN HAVE IF GIVEN OVER 2HOURS RED MAN SYNDROME   CAN HAVE IF GIVEN OVER 2HOURS   Citalopram Other (See Comments)    Dystonia  Other reaction(s): Other (See Comments) Dystonia    Other reaction(s): Other (See Comments) Dystonia Dystonia Dystonia   Nortriptyline Other (See Comments)    Dry mouth at 25 mg dose.  Tolerates 10 mg dose  Other reaction(s): Other (See Comments)  Dry Mouth  Other Reaction(s): Other  (See Comments)  Dry Mouth at 25 mg dose. Tolerates 10 mg dose.    Dry mouth at 25 mg dose.  Tolerates 10 mg dose    Dry Mouth    Dry mouth at 25 mg dose.  Tolerates 10 mg dose Other reaction(s): Other (See Comments) Dry Mouth    Dry mouth at 25 mg dose.  Tolerates 10 mg dose Dry Mouth at 25 mg dose. Tolerates 10 mg dose. Dry mouth at 25 mg dose.  Tolerates 10 mg dose   Pregabalin Other (See Comments)    Ineffective  Other reaction(s): Other (See Comments)  Tardive dyskinesia  Other Reaction(s): Other (See Comments)  Ineffective    Tardive dyskinesia    Ineffective Other reaction(s): Other (See Comments) Tardive dyskinesia    Ineffective Ineffective   Ibuprofen Other (See Comments)    Contraindicated with Xarelto.    Sulfacetamide Sodium-Sulfur Rash    Home Medications    Prior to Admission medications   Medication Sig Start Date End Date Taking? Authorizing Provider  acyclovir ointment (ZOVIRAX) 5 % Apply topically every 3 (three) hours. For acute breakout of cold sores for 7 days. 09/03/22   Karie Georges, MD  baclofen (LIORESAL) 20 MG tablet Take 1-2 tablets (20-40 mg total) by mouth 4 (four) times daily. 1 tablet with breakfast, lunch, and dinner and 2 tablets at bedtime 02/04/23   Ranelle Oyster, MD  calcium carbonate (OS-CAL) 1250 (500 Ca) MG chewable tablet Chew 1 tablet by mouth daily.    [provider]  desonide (DESOWEN) 0.05 % cream Apply to affected area(s) twice daily 07/08/22     desvenlafaxine (PRISTIQ) 100 MG 24 hr tablet Take 1 tablet (100 mg total) by mouth daily. (total dose 150mg ) 08/12/22     desvenlafaxine (PRISTIQ) 50 MG 24 hr tablet Take 1 tablet (50 mg total) by mouth daily. Take with 100 mg tablet for a 150 mg total  dose. 02/04/23     famotidine (PEPCID) 40 MG tablet Take 1 tablet (40 mg total) by mouth nightly. 02/03/23     Fluocinolone Acetonide 0.01 % OIL Place 4 drops into both ears at bedtime for 14 days, THEN stop and use as needed  for itching. 01/22/23 03/06/23    folic acid (KP FOLIC ACID) 1 MG tablet Take 1 tablet po every day 11/05/21     folic acid (KP FOLIC ACID) 1 MG tablet Take 1 tablet (1 mg total) by mouth daily. 12/10/22     furosemide (LASIX) 20 MG tablet Take 1 tablet (20 mg total) by mouth daily as needed for edema. 01/15/23 04/16/23  Croitoru, Mihai, MD  furosemide (LASIX) 80 MG tablet Take 1 tablet (80 mg total) by mouth daily. 10/01/22 09/26/23  Croitoru, Rachelle Hora, MD  halobetasol (ULTRAVATE) 0.05 % ointment Apply 1 Application topically 2 (two) times daily to hands, feet and knees 01/15/23     hydrOXYzine (ATARAX) 25 MG tablet Take 1 tablet (25 mg total) by mouth 4 (four) times daily as needed for anxiety 08/12/22     Loteprednol Etabonate (LOTEMAX SM) 0.38 % GEL Place 1 drop into both eyes 3 (three) times daily. 12/01/22     metFORMIN (GLUCOPHAGE) 500 MG tablet Take 1 tablet (500 mg total) by mouth 2 (two) times daily with a meal. 12/16/22   Karie Georges, MD  methotrexate (RHEUMATREX) 2.5 MG tablet Take 6 tablets (15 mg total) by mouth once a week. 09/24/22     metoprolol tartrate (LOPRESSOR) 25 MG tablet Take 1 tablet (25 mg total) by mouth 2 (two) times daily as needed. 10/01/22 02/09/23  Croitoru, Mihai, MD  morphine (MS CONTIN) 30 MG 12 hr tablet Take 1 tablet (30 mg total) by mouth every 12 (twelve) hours. 01/27/23   Ranelle Oyster, MD  OVER THE COUNTER MEDICATION Tums-1000mg  once a day    [provider]  Oxycodone HCl 10 MG TABS Take 1 tablet (10 mg total) by mouth every 12 (twelve) hours as needed (pain). 01/27/23   Ranelle Oyster, MD  Pancrelipase, Lip-Prot-Amyl, (CREON) 24000-76000 units CPEP Take 2 capsules by mouth in the morning and 2 capsules at noon and 2 capsules in the evening. Take with meals. 08/05/22     phentermine (ADIPEX-P) 37.5 MG tablet Take 1 tablet (37.5 mg total) by mouth daily before breakfast. Or 1/2 tablet twice a day. 02/10/23   Karie Georges, MD  phentermine (ADIPEX-P)  37.5 MG tablet Take 1 tablet (37.5 mg total) by mouth daily before breakfast. Or 1/2 tablet twice a day. 12/16/22   Karie Georges, MD  phentermine (ADIPEX-P) 37.5 MG tablet Take 1 tablet (37.5 mg total) by mouth daily before breakfast. Or 1/2 tablet twice a day. 02/09/23   Karie Georges, MD  potassium chloride (KLOR-CON) 10 MEQ tablet Take 2 tablets by mouth every day. 02/06/23   Croitoru, Mihai, MD  RABEprazole (ACIPHEX) 20 MG tablet Take 1 tablet (20 mg total) by mouth in the morning AND 1 tablet (20 mg total) every evening. Take before meals. 11/26/22     rivaroxaban (XARELTO) 20 MG TABS tablet Take 1 tablet (20 mg total) by mouth Nightly. 12/22/22     Secukinumab, 300 MG Dose, (COSENTYX SENSOREADY, 300 MG,) 150 MG/ML SOAJ Inject 2(two) pens subcutaneous every 4 weeks 08/29/22   Quentin Angst, MD  Tenapanor HCl (IBSRELA) 50 MG TABS Take 50 mg by mouth 2 (two) times daily. 10/09/22  Testosterone 25 MG/2.5GM (1%) GEL Apply a pea-sized amount topically to desired area once daily. 10/14/22     testosterone cypionate (DEPOTESTOSTERONE CYPIONATE) 200 MG/ML injection Inject 0.5 mLs (100 mg total) into the muscle once a week. 08/18/22     topiramate (TOPAMAX) 50 MG tablet Take 50 mg by mouth 2 (two) times daily.    [provider]  ursodiol (ACTIGALL) 250 MG tablet Take 1 tablet (250 mg total) by mouth 2 (two) times daily. 12/02/22     ursodiol (ACTIGALL) 500 MG tablet Take 1 tablet (500 mg total) by mouth 2 (two) times daily. 12/02/22     Vitamin D, Ergocalciferol, (DRISDOL) 1.25 MG (50000 UNIT) CAPS capsule Take 1 capsule (50,000 Units total) by mouth once a week. 02/06/23     pantoprazole (PROTONIX) 40 MG tablet Take 1 tablet (40 mg total) by mouth every morning before breakfast. 09/10/22 02/03/23      Physical Exam    Vital Signs:  Jesse Bowers does not have vital signs available for review today  Given telephonic nature of communication, physical exam is limited. AAOx3.  NAD. Normal affect.  Speech and respirations are unlabored.  Accessory Clinical Findings    None  Assessment & Plan    1.  Preoperative Cardiovascular Risk Assessment:  .According to the Revised Cardiac Risk Index (RCRI), his Perioperative Risk of Major Cardiac Event is (%): 0.9  His Functional Capacity in METs is: 4.95 according to the [facility] Activity Status Index (DASI).   The patient was advised that if he develops new symptoms prior to surgery to contact our office to arrange for a follow-up visit, and he verbalized understanding.  Therefore, based on ACC/AHA guidelines, patient would be at acceptable risk for the planned procedure without further cardiovascular testing. I will route this recommendation to the requesting party via Epic fax function.   Xarelto should be addressed by prescriber, Dr. Ree Edman [facility]  A copy of this note will be routed to requesting surgeon.  Time:   Today, I have spent 10 minutes with the patient with telehealth technology discussing medical history, symptoms, and management plan.     Joni Reining, NP  02/11/2023, 2:44 PM

## 2023-02-10 ENCOUNTER — Other Ambulatory Visit (HOSPITAL_BASED_OUTPATIENT_CLINIC_OR_DEPARTMENT_OTHER): Payer: Self-pay

## 2023-02-10 ENCOUNTER — Other Ambulatory Visit (HOSPITAL_COMMUNITY): Payer: Self-pay

## 2023-02-11 ENCOUNTER — Other Ambulatory Visit (HOSPITAL_BASED_OUTPATIENT_CLINIC_OR_DEPARTMENT_OTHER): Payer: Self-pay

## 2023-02-11 ENCOUNTER — Encounter
Payer: Commercial Managed Care - PPO | Attending: Physical Medicine & Rehabilitation | Admitting: Physical Medicine & Rehabilitation

## 2023-02-11 ENCOUNTER — Encounter: Payer: Self-pay | Admitting: Physical Medicine & Rehabilitation

## 2023-02-11 ENCOUNTER — Ambulatory Visit: Payer: Commercial Managed Care - PPO | Attending: Cardiology

## 2023-02-11 ENCOUNTER — Other Ambulatory Visit: Payer: Self-pay

## 2023-02-11 DIAGNOSIS — Z01810 Encounter for preprocedural cardiovascular examination: Secondary | ICD-10-CM

## 2023-02-11 DIAGNOSIS — M47816 Spondylosis without myelopathy or radiculopathy, lumbar region: Secondary | ICD-10-CM

## 2023-02-11 DIAGNOSIS — Z01818 Encounter for other preprocedural examination: Secondary | ICD-10-CM

## 2023-02-11 DIAGNOSIS — F331 Major depressive disorder, recurrent, moderate: Secondary | ICD-10-CM | POA: Diagnosis not present

## 2023-02-11 MED ORDER — MORPHINE SULFATE ER 30 MG PO TBCR
30.0000 mg | EXTENDED_RELEASE_TABLET | Freq: Two times a day (BID) | ORAL | 0 refills | Status: DC
  Filled 2023-02-11 – 2023-02-25 (×2): qty 60, 30d supply, fill #0

## 2023-02-11 NOTE — Patient Instructions (Signed)
ALWAYS FEEL FREE TO CALL OUR OFFICE WITH ANY PROBLEMS OR QUESTIONS ([phone])  **PLEASE NOTE** ALL MEDICATION REFILL REQUESTS (INCLUDING CONTROLLED SUBSTANCES) NEED TO BE MADE AT LEAST 7 DAYS PRIOR TO REFILL BEING DUE. ANY REFILL REQUESTS INSIDE THAT TIME FRAME MAY RESULT IN DELAYS IN RECEIVING YOUR PRESCRIPTION.                    

## 2023-02-11 NOTE — Progress Notes (Signed)
Subjective:    Patient ID: Jesse Bowers, male  DOB: 03-16-1965, 57 y.o.   MRN: [id number]  HPI  Jesse Bowers is here in follow up of his chronic pain and functional mobility issues. He just came back from his mom's who fell and had an avulsion fx.   He has upcoming arthroscopic surgery on his right wrist by Dr. Orlan Leavens this coming Monday as he's had some ongoing pain and tingling in the right wrist.  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 I reviewed his last MRI from 8/23 which showed minimal difference from his scan in 2019.   He is awaiting replacement of a phrenic pacer to help with his sleep apnea.  He didn't tolerate BIPAP  Lumbar ESI was helpful. Nexwave is helping with episodic pain. He has been using less of his oxycodone as pain has been improved. He remains on ms contin 30mg  q12. He is moving his bowels regulalry but has had increasing difficulty with gluten digetion.     Pain Inventory Average Pain 6 Pain Right Now 8 My pain is sharp, stabbing, and aching  LOCATION OF PAIN  wrist, neck, fingers, back, hip, leg, ankle, toes  BOWEL Number of stools per week: 7-8 Oral laxative use Yes  Type of laxative . Enema or suppository use No  History of colostomy No  Incontinent No   BLADDER Normal In and out cath, frequency . Able to self cath  . Bladder incontinence No  Frequent urination Yes  Leakage with coughing No  Difficulty starting stream Yes  Incomplete bladder emptying No    Mobility walk without assistance walk with assistance use a cane use a walker how many minutes can you walk? 5 ability to climb steps?  yes do you drive?  yes use a wheelchair transfers alone  Function disabled: date disabled 07/2012 I need assistance with the following:  meal prep, household duties, and shopping  Neuro/Psych numbness tremor tingling trouble  walking spasms dizziness anxiety  Prior Studies Any changes since last visit?  no  Physicians involved in your care Any changes since last visit?  no   Family History  Problem Relation Age of Onset   Stroke Maternal Grandfather        24   Heart attack Maternal Grandfather    Glaucoma Maternal Grandfather    Macular degeneration Maternal Grandfather    Breast cancer Sister    Hypertension Mother    Psoriasis Mother    Other Mother        meningioma developed ~2019   Glaucoma Mother    Cancer Paternal Grandfather    Heart attack Paternal Grandfather    Stroke Paternal Uncle        age 30   Polycythemia Paternal Uncle    Stroke Maternal Grandmother    Congestive Heart Failure Maternal Grandmother    Heart attack Maternal Grandmother    Protein C deficiency Sister 20       Miscarriages   Breast cancer Maternal Aunt 35   Social History   Socioeconomic History   Marital status: Married    Spouse name: Not on file   Number of children: 2   Years of education: 4y college   Highest education level: Master's degree (e.g., MA, MS, MEng, MEd, MSW, MBA)  Occupational History   Occupation: Pediatric Nurse practitioner    Comment:  Not working since CVA 2015  Tobacco Use   Smoking status: Never   Smokeless tobacco: Never  Vaping Use   Vaping status: Never Used  Substance and Sexual Activity   Alcohol use: Yes    Comment: social   Drug use: No   Sexual activity: Yes    Birth control/protection: None    Comment: patient is a transgender on testosterone shots, no biological kids  Other Topics Concern   Not on file  Social History Narrative   Not on file   Social Determinants of Health   Financial Resource Strain: Low Risk  (01/14/2023)   Received from [facility]   Overall Financial Resource Strain (CARDIA)    Difficulty of Paying Living Expenses: Not very hard  Recent Concern: Financial Resource Strain - High Risk (12/12/2022)   Overall Financial Resource  Strain (CARDIA)    Difficulty of Paying Living Expenses: Hard  Food Insecurity: Low Risk  (02/06/2023)   Received from [facility]   Hunger Vital Sign    Worried About Running Out of Food in the Last Year: Never true    Ran Out of Food in the Last Year: Never true  Recent Concern: Food Insecurity - Food Insecurity Present (01/14/2023)   Received from [facility]   Hunger Vital Sign    Worried About Running Out of Food in the Last Year: Never true    Ran Out of Food in the Last Year: Sometimes true  Transportation Needs: No Transportation Needs (02/06/2023)   Received from [facility]    In the past 12 months, has lack of reliable transportation kept you from medical appointments, meetings, work or from getting things needed for daily living? : No  Recent Concern: Transportation Needs - Unmet Transportation Needs (01/14/2023)   Received from [facility] - Transportation    Lack of Transportation (Medical): Yes    Lack of Transportation (Non-Medical): Yes  Physical Activity: Unknown (01/14/2023)   Received from [facility]   Exercise Vital Sign    Days of Exercise per Week: 0 days    Minutes of Exercise per Session: Not on file  Stress: Stress Concern Present (01/14/2023)   Received from [facility] - Occupational Stress Questionnaire    Feeling of Stress : Rather much  Social Connections: Somewhat Isolated (01/14/2023)   Received from [facility]   Social Network    How would you rate your social network (family, work, friends)?: Restricted participation with some degree of social isolation   Past Surgical History:  Procedure Laterality Date   ABDOMINAL HYSTERECTOMY Bilateral 1994   TAH, BSO- tranverse incision at 57 yo   ANKLE ARTHROSCOPY WITH RECONSTRUCTION Right 2007   CHOLECYSTECTOMY     laparoscopic   COLONOSCOPY     x3   EYE SURGERY     Left eye 03/02/2018, right 02/15/2018    HEMORROIDECTOMY  08/19/2020   HIP ARTHROSCOPY W/ LABRAL REPAIR Right 05/11/2013   acetabular labral tear 03/30/2013   KNEE ARTHROPLASTY Right    KNEE JOINT MANIPULATION Left    x3 under anesthesia   KNEE SURGERY Bilateral 1984   Right ACL, left PCL repair   LITHOTRIPSY  2005   LIVER BIOPSY  2013   normal results.   MASTECTOMY Bilateral    prior to 2009   MOUTH SURGERY     NASAL SEPTUM SURGERY N/A 09/20/2015   by ENT Dr. Ezzard Standing   OVARIAN  CYST SURGERY Left    size of grapefruit, was informed that she had shortened vagina   SHOULDER SURGERY Bilateral    Right 08/15/2016, Left 11/15/2016   THUMB ARTHROSCOPY Left    THYROIDECTOMY, PARTIAL Left 2008   TOTAL KNEE ARTHROPLASTY Right 08/23/2018   Procedure: TOTAL KNEE ARTHROPLASTY;  Surgeon: Ollen Gross, MD;  Location: [facility];  Service: Orthopedics;  Laterality: Right;    TOTAL KNEE REVISION Left 02/06/2016   Procedure: LEFT TOTAL KNEE REVISION;  Surgeon: Ollen Gross, MD;  Location: [facility];  Service: Orthopedics;  Laterality: Left;   TOTAL KNEE REVISION Left 04/22/2017   Procedure: Left knee polyethylene revision;  Surgeon: Ollen Gross, MD;  Location: [facility];  Service: Orthopedics;  Laterality: Left;   UPPER GI ENDOSCOPY  2003   Past Medical History:  Diagnosis Date   Abnormal weight loss    Anxiety    Arthritis    Cataract    OU   Celiac disease    Cervical neck pain with evidence of disc disease    patient has a cyst    Chronic constipation    Chronic diastolic heart failure (HCC)    Pt. 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   Family history of adverse reaction to anesthesia    family has problems with anesthesia of nausea and vomiting    GERD (gastroesophageal reflux disease)    History of in  20's   Gluten enteropathy    H/O parotitis    right    Hard of hearing    History of kidney stones    History of retinal tear    Bilateral   History of staph infection    required wound vac   Hx-TIA (transient ischemic attack)    2015   Kidney stones 08/2020   LVH (left ventricular hypertrophy) 12/15/2016   Mild, noted on ECHO   MVP (mitral valve prolapse)    NAFL (nonalcoholic fatty liver)    Neck pain    Neuromuscular disorder (HCC)    bilateral neuropathy feet.   Nuclear sclerotic cataract of both eyes 09/08/2019   Pneumonia 12/17/2010   Polycythemia    Polycythemia, secondary    PONV (postoperative nausea and vomiting)    Protein C deficiency (HCC)    Dr. Thomes Cake   Psoriasis    16 X10 cm psoriatic rash on sole of left foot ; open and occ scant bleeding;    psoriatic arthritis    PTSD (post-traumatic stress disorder)    Scaphoid fracture of wrist 09/23/2013   Seizure (HCC)    childhood, medication until age 12 then weanned completely off   Sleep apnea    split night study last done by Dr. Epimenio Foot 06/18/15 shows severe OSA, CSA, and hypersomnia, rec bipap   Splenomegaly    Stenosis of ureteropelvic junction (UPJ)    left   Stroke [facility])    CVA vs TIA in left cerebrum causing slight right sided weakness-Dr. 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    Wears glasses    Wears hearing aid    BP 126/83   Pulse 99   Ht 5\' 7"  (1.702 m)   Wt 204 lb (92.5 kg)   SpO2 95%   BMI 31.95 kg/m   Opioid Risk Score:   Fall Risk Score:  `1  Depression screen [facility] 2/9     01/16/2023    2:31 PM 12/16/2022   12:59 PM 12/10/2022   10:48 AM 08/06/2022    3:14 PM 07/07/2022    1:32 PM 06/12/2022    2:24 PM 06/06/2022    1:02 PM  Depression screen PHQ 2/9  Decreased Interest 1 1 0  0 1 0 0  Down, Depressed, Hopeless 2 1 0 0 1 0 0  PHQ - 2 Score 3 2 0 0 2 0 0  Altered sleeping 3 1   1 2    Tired, decreased energy 1 1    0   Change in appetite 0 1   0 0   Feeling bad or failure about yourself  3 0   0 0   Trouble concentrating 3 1   2 2    Moving slowly or fidgety/restless 0 0   0 0   Suicidal thoughts 0 0   0 0   PHQ-9 Score 13 6   5 4    Difficult doing work/chores Very difficult Somewhat difficult   Somewhat difficult       Review of Systems  Musculoskeletal:  Positive for back pain, gait problem and neck pain.       Wrist, fingers, right hip, leg, ankle, toes pain spasms  Psychiatric/Behavioral:  The patient is nervous/anxious.   All other systems reviewed and are negative.     Objective:   Physical Exam General: No acute distress HEENT: NCAT, EOMI, oral membranes moist Cards: reg rate  Chest: normal effort Abdomen: Soft, NT, ND Skin: dry, intact Extremities: no edema Psych: pleasant and appropriate  Skin: intact Neuro: Alert and oriented x 3. Normal insight and awareness. Intact Memory. Normal language and speech. Cranial nerve exam unremarkable. LE motor 5/5 LLE and 4-5/ RLe with more weakness in ADF than anywhere else. He has stocking glove sensory loss in both feet below ankles as well as sensory loss along left lateral leg. Right 2-4 toes more affected. Tends to land farther ahead with left foot than right. --Did not test him in depth today.  Still some valgus at left knee, steppage pattern with right knee still somewhat present.    no abnormal resting tone Musculoskeletal:  pain at left TFL, tib-fib area with palpation and extension. Mid back tender along spinaous process at T3-6 level. No major muscle spasm. Posture fair iwht elevation of right pelvs           Assessment & Plan:  1. Psoriatic arthritis with pain in multiple areas, most prominently feet, hands, elbows. Pain also related to his prior CVA and associated motor/sensory change   2.  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 3. Hx of left total knee revision again on 04/21/17 per [facility] and right TKA 08/2018 thru [facility].  4. Chronic  low back pain---MRI with severe DDD at L5-S1. Left S1 radiculopathy? only mild foraminal stenosis however -S/P left translaminar L5-S1 ESI by Dr. Wynn Banker.   -improved  pain control -continue HEP -nexwave --TENS for back pain has been helpful- 5. Protein C deficiency   6. Left shoulder subluxation, bicipital tendonitis 7. Central sleep apnea 8. Chronic opioid use for pain             -continue ms contin 30mg  q12 hours. Today RF #60             -continue oxycodone 10mg  q12 prn with goal of every day prn he has been using this fairly sparingly.  If he needs to take it more frequently postoperatively next week that is fine.             -We will continue the controlled substance monitoring program, this consists of regular clinic visits, examinations, routine drug screening, pill counts as well as use of [place] Controlled Substance Reporting System. [place].   9. Depression with anxiety.  0. Left lateral epidondylitis  11. 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  has had right and left sided weakness with sensory loss in C4-6 dermatomes.. Most recent cervical MRI without significant change from 2019 study  -expressed to him that we wouldn't engage surgery given the stable presence of the last scan.  We can recheck an MRI based on his clinical presentation.  His neurological exam has remained stable. 12. 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 OINC 14. Right wrist psoriatic arthritis, tendonapthy, ganglion cysts s/p multiple recent surgeries.  s/p right thumb replacement             -is followed by Dr. Orlan Leavens at [facility]--  -arthroscopic surgery next week  15. Recent  gender reconstruction surgery--progressing   20 minutes of face to face patient care time were spent during this visit. All questions were encouraged and answered. Follow up with me in about 2 months.

## 2023-02-13 ENCOUNTER — Other Ambulatory Visit (HOSPITAL_BASED_OUTPATIENT_CLINIC_OR_DEPARTMENT_OTHER): Payer: Self-pay

## 2023-02-16 ENCOUNTER — Other Ambulatory Visit: Payer: Self-pay

## 2023-02-16 DIAGNOSIS — M25531 Pain in right wrist: Secondary | ICD-10-CM | POA: Diagnosis not present

## 2023-02-16 DIAGNOSIS — M65941 Unspecified synovitis and tenosynovitis, right hand: Secondary | ICD-10-CM | POA: Diagnosis not present

## 2023-02-16 DIAGNOSIS — G8918 Other acute postprocedural pain: Secondary | ICD-10-CM | POA: Diagnosis not present

## 2023-02-16 DIAGNOSIS — Y999 Unspecified external cause status: Secondary | ICD-10-CM | POA: Diagnosis not present

## 2023-02-16 DIAGNOSIS — S63591A Other specified sprain of right wrist, initial encounter: Secondary | ICD-10-CM | POA: Diagnosis not present

## 2023-02-16 DIAGNOSIS — X58XXXA Exposure to other specified factors, initial encounter: Secondary | ICD-10-CM | POA: Diagnosis not present

## 2023-02-17 ENCOUNTER — Other Ambulatory Visit (HOSPITAL_BASED_OUTPATIENT_CLINIC_OR_DEPARTMENT_OTHER): Payer: Self-pay

## 2023-02-17 ENCOUNTER — Other Ambulatory Visit: Payer: Self-pay

## 2023-02-18 ENCOUNTER — Other Ambulatory Visit (HOSPITAL_COMMUNITY): Payer: Self-pay

## 2023-02-18 DIAGNOSIS — R748 Abnormal levels of other serum enzymes: Secondary | ICD-10-CM | POA: Diagnosis not present

## 2023-02-18 DIAGNOSIS — Z79890 Hormone replacement therapy (postmenopausal): Secondary | ICD-10-CM | POA: Diagnosis not present

## 2023-02-18 DIAGNOSIS — F331 Major depressive disorder, recurrent, moderate: Secondary | ICD-10-CM | POA: Diagnosis not present

## 2023-02-20 ENCOUNTER — Other Ambulatory Visit (HOSPITAL_BASED_OUTPATIENT_CLINIC_OR_DEPARTMENT_OTHER): Payer: Self-pay

## 2023-02-20 DIAGNOSIS — R55 Syncope and collapse: Secondary | ICD-10-CM | POA: Diagnosis not present

## 2023-02-20 DIAGNOSIS — G4731 Primary central sleep apnea: Secondary | ICD-10-CM | POA: Diagnosis not present

## 2023-02-20 DIAGNOSIS — H608X3 Other otitis externa, bilateral: Secondary | ICD-10-CM | POA: Diagnosis not present

## 2023-02-20 DIAGNOSIS — H903 Sensorineural hearing loss, bilateral: Secondary | ICD-10-CM | POA: Diagnosis not present

## 2023-02-20 DIAGNOSIS — H6123 Impacted cerumen, bilateral: Secondary | ICD-10-CM | POA: Diagnosis not present

## 2023-02-20 DIAGNOSIS — Z7689 Persons encountering health services in other specified circumstances: Secondary | ICD-10-CM | POA: Diagnosis not present

## 2023-02-20 DIAGNOSIS — R030 Elevated blood-pressure reading, without diagnosis of hypertension: Secondary | ICD-10-CM | POA: Diagnosis not present

## 2023-02-20 MED ORDER — ONDANSETRON 4 MG PO TBDP
4.0000 mg | ORAL_TABLET | Freq: Three times a day (TID) | ORAL | 0 refills | Status: DC | PRN
  Filled 2023-02-20: qty 20, 7d supply, fill #0

## 2023-02-23 DIAGNOSIS — Z7901 Long term (current) use of anticoagulants: Secondary | ICD-10-CM | POA: Diagnosis not present

## 2023-02-23 DIAGNOSIS — Z8673 Personal history of transient ischemic attack (TIA), and cerebral infarction without residual deficits: Secondary | ICD-10-CM | POA: Diagnosis not present

## 2023-02-23 DIAGNOSIS — Z881 Allergy status to other antibiotic agents status: Secondary | ICD-10-CM | POA: Diagnosis not present

## 2023-02-23 DIAGNOSIS — Z880 Allergy status to penicillin: Secondary | ICD-10-CM | POA: Diagnosis not present

## 2023-02-23 DIAGNOSIS — N189 Chronic kidney disease, unspecified: Secondary | ICD-10-CM | POA: Diagnosis not present

## 2023-02-23 DIAGNOSIS — Z7689 Persons encountering health services in other specified circumstances: Secondary | ICD-10-CM | POA: Diagnosis not present

## 2023-02-23 DIAGNOSIS — G4731 Primary central sleep apnea: Secondary | ICD-10-CM | POA: Diagnosis not present

## 2023-02-23 DIAGNOSIS — Z79899 Other long term (current) drug therapy: Secondary | ICD-10-CM | POA: Diagnosis not present

## 2023-02-23 DIAGNOSIS — Z452 Encounter for adjustment and management of vascular access device: Secondary | ICD-10-CM | POA: Diagnosis not present

## 2023-02-24 ENCOUNTER — Other Ambulatory Visit (HOSPITAL_BASED_OUTPATIENT_CLINIC_OR_DEPARTMENT_OTHER): Payer: Self-pay

## 2023-02-24 MED ORDER — TESTOSTERONE CYPIONATE 200 MG/ML IM SOLN
80.0000 mg | INTRAMUSCULAR | 2 refills | Status: DC
  Filled 2023-03-25: qty 4, 28d supply, fill #0
  Filled 2023-04-23: qty 4, 28d supply, fill #1
  Filled 2023-06-19: qty 4, 28d supply, fill #2
  Filled 2023-07-29: qty 4, 28d supply, fill #3

## 2023-02-26 ENCOUNTER — Other Ambulatory Visit: Payer: Self-pay

## 2023-02-26 ENCOUNTER — Other Ambulatory Visit (HOSPITAL_BASED_OUTPATIENT_CLINIC_OR_DEPARTMENT_OTHER): Payer: Self-pay

## 2023-03-03 DIAGNOSIS — F331 Major depressive disorder, recurrent, moderate: Secondary | ICD-10-CM | POA: Diagnosis not present

## 2023-03-06 DIAGNOSIS — F331 Major depressive disorder, recurrent, moderate: Secondary | ICD-10-CM | POA: Diagnosis not present

## 2023-03-06 DIAGNOSIS — M25531 Pain in right wrist: Secondary | ICD-10-CM | POA: Diagnosis not present

## 2023-03-09 ENCOUNTER — Other Ambulatory Visit: Payer: Self-pay

## 2023-03-09 ENCOUNTER — Other Ambulatory Visit (HOSPITAL_BASED_OUTPATIENT_CLINIC_OR_DEPARTMENT_OTHER): Payer: Self-pay

## 2023-03-09 ENCOUNTER — Other Ambulatory Visit: Payer: Self-pay | Admitting: Physical Medicine & Rehabilitation

## 2023-03-09 DIAGNOSIS — L4050 Arthropathic psoriasis, unspecified: Secondary | ICD-10-CM

## 2023-03-09 DIAGNOSIS — M47816 Spondylosis without myelopathy or radiculopathy, lumbar region: Secondary | ICD-10-CM

## 2023-03-09 DIAGNOSIS — D751 Secondary polycythemia: Secondary | ICD-10-CM | POA: Diagnosis not present

## 2023-03-09 MED ORDER — OXYCODONE HCL 10 MG PO TABS
10.0000 mg | ORAL_TABLET | Freq: Two times a day (BID) | ORAL | 0 refills | Status: DC | PRN
  Filled 2023-03-09: qty 60, 30d supply, fill #0

## 2023-03-09 NOTE — Telephone Encounter (Signed)
 Rx written and sent to the pharmacy. Thanks!

## 2023-03-11 ENCOUNTER — Other Ambulatory Visit (HOSPITAL_COMMUNITY): Payer: Self-pay

## 2023-03-11 ENCOUNTER — Other Ambulatory Visit (HOSPITAL_COMMUNITY): Payer: Self-pay | Admitting: Pharmacy Technician

## 2023-03-11 NOTE — Progress Notes (Signed)
 Specialty Pharmacy Refill Coordination Note  Jesse Bowers is a 58 y.o. adult contacted today regarding refills of specialty medication(s) Secukinumab  (Cosentyx  Sensoready (300 MG))   Patient requested [name] at [facility] [place] date: 03/17/23   Medication will be filled on 03/16/23.

## 2023-03-12 ENCOUNTER — Ambulatory Visit (INDEPENDENT_AMBULATORY_CARE_PROVIDER_SITE_OTHER): Payer: Commercial Managed Care - PPO | Admitting: Internal Medicine

## 2023-03-12 VITALS — BP 102/70 | HR 111 | Temp 98.3°F | Wt 202.1 lb

## 2023-03-12 DIAGNOSIS — J069 Acute upper respiratory infection, unspecified: Secondary | ICD-10-CM | POA: Diagnosis not present

## 2023-03-12 NOTE — Progress Notes (Signed)
 Established Patient Office Visit     CC/Reason for Visit: URI symptoms  HPI: Jesse Bowers is a 58 y.o. adult who is coming in today for the above mentioned reasons.  For the past 6 days has been experiencing fatigue, body aches, cough, postnasal drip, runny nose and headache.  He has taken 2 COVID tests at home that have been negative.   Past Medical/Surgical History: Past Medical History:  Diagnosis Date   Abnormal weight loss    Anxiety    Arthritis    Cataract    OU   Celiac disease    Cervical neck pain with evidence of disc disease    patient has a cyst    Chronic constipation    Chronic diastolic heart failure (HCC)    Pt. 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   Family history of adverse reaction to anesthesia    family has problems with anesthesia of nausea and vomiting    GERD (gastroesophageal reflux disease)    History of in 20's   Gluten enteropathy    H/O parotitis    right    Hard of hearing    History of kidney stones    History of retinal tear    Bilateral   History of staph infection    required wound vac   Hx-TIA (transient ischemic attack)    2015   Kidney stones 08/2020   LVH (left ventricular hypertrophy) 12/15/2016   Mild, noted on ECHO   MVP (mitral valve prolapse)    NAFL (nonalcoholic fatty liver)    Neck pain    Neuromuscular disorder (HCC)    bilateral neuropathy feet.   Nuclear sclerotic cataract of both eyes 09/08/2019   Pneumonia 12/17/2010   Polycythemia    Polycythemia, secondary    PONV (postoperative nausea and vomiting)    Protein C deficiency (HCC)    Dr. Lanny Abide   Psoriasis    16 X10 cm psoriatic rash on sole of left foot ; open and occ scant bleeding;    psoriatic arthritis     PTSD (post-traumatic stress disorder)    Scaphoid fracture of wrist 09/23/2013   Seizure (HCC)    childhood, medication until age 74 then weanned completely off   Sleep apnea    split night study last done by Dr. 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    Wears glasses    Wears hearing aid     Past Surgical History:  Procedure Laterality Date   ABDOMINAL HYSTERECTOMY Bilateral 1994   TAH, BSO- tranverse incision at 58 yo   ANKLE ARTHROSCOPY WITH RECONSTRUCTION Right 2007   CHOLECYSTECTOMY     laparoscopic  COLONOSCOPY     x3   EYE SURGERY     Left eye 03/02/2018, right 02/15/2018   HEMORROIDECTOMY  08/19/2020   HIP ARTHROSCOPY W/ LABRAL REPAIR Right 05/11/2013   acetabular labral tear 03/30/2013   KNEE ARTHROPLASTY Right    KNEE JOINT MANIPULATION Left    x3 under anesthesia   KNEE SURGERY Bilateral 1984   Right ACL, left PCL repair   LITHOTRIPSY  2005   LIVER BIOPSY  2013   normal results.   MASTECTOMY Bilateral    prior to 2009   MOUTH SURGERY     NASAL SEPTUM SURGERY N/A 09/20/2015   by ENT Dr. Ethyl   OVARIAN CYST SURGERY Left    size of grapefruit, was informed that she had shortened vagina   SHOULDER SURGERY Bilateral    Right 08/15/2016, Left 11/15/2016   THUMB ARTHROSCOPY Left    THYROIDECTOMY, PARTIAL Left 2008   TOTAL KNEE ARTHROPLASTY Right 08/23/2018   Procedure: TOTAL KNEE ARTHROPLASTY;  Surgeon: Melodi Lerner, MD;  Location: [facility];  Service: Orthopedics;  Laterality: Right;    TOTAL KNEE REVISION Left 02/06/2016   Procedure: LEFT  TOTAL KNEE REVISION;  Surgeon: Lerner Melodi, MD;  Location: [facility];  Service: Orthopedics;  Laterality: Left;   TOTAL KNEE REVISION Left 04/22/2017   Procedure: Left knee polyethylene revision;  Surgeon: Melodi Lerner, MD;  Location: [facility];  Service: Orthopedics;  Laterality: Left;   UPPER GI ENDOSCOPY  2003    Social History:  reports that he has never smoked. He has never used smokeless tobacco. He reports current alcohol  use. He reports that he does not use drugs.  Allergies: Allergies  Allergen Reactions   Gabapentin  Nausea Only and Other (See Comments)    Other reaction(s): nausea, mental status  Drowsiness and restlessness. Pt. States,  It makes me crazy, I can't take this medicine.  Other reaction(s): Other (See Comments)  Tardive dyskinesia  Other Reaction(s): Other (See Comments)  Drowsiness and restlessness.   Misc. Sulfonamide Containing Compounds Rash and Other (See Comments)    Stevens-Johnson rash   Penicillin G Anaphylaxis and Other (See Comments)    Has patient had a PCN reaction causing immediate rash, facial/tongue/throat swelling, SOB or lightheadedness with hypotension: Yes Has patient had a PCN reaction causing severe rash involving mucus membranes or skin necrosis: No Has patient had a PCN reaction that required hospitalization Yes Has patient had a PCN reaction occurring within the last 10 years: No If all of the above answers are NO, then may proceed with Cephalosporin use.    Silver Dermatitis, Hives, Itching, Rash and Swelling    Causes blistering wounds   Sulfa Antibiotics Rash    Stevens-Johnson rash   Sulfamethoxazole     Other Reaction(s): Stevens-Johnson Syndrome   Vancomycin  Other (See Comments) and Rash    RED MAN SYNDROME    CAN HAVE IF GIVEN OVER 2HOURS  Other Reaction(s): Other (See Comments), stevens-johnson syndrome  RED MAN SYNDROME      CAN HAVE IF GIVEN OVER 2HOURS  rash    RED MAN SYNDROME   CAN HAVE IF GIVEN OVER  2HOURS    RED MAN SYNDROME    CAN HAVE IF GIVEN OVER 2HOURS    rash RED MAN SYNDROME   CAN HAVE IF GIVEN OVER 2HOURS RED MAN SYNDROME   CAN HAVE IF GIVEN OVER 2HOURS RED MAN SYNDROME   CAN HAVE IF GIVEN OVER 2HOURS   Citalopram Other (See Comments)  Dystonia  Other reaction(s): Other (See Comments) Dystonia    Other reaction(s): Other (See Comments) Dystonia Dystonia Dystonia   Nortriptyline  Other (See Comments)    Dry mouth at 25 mg dose.  Tolerates 10 mg dose  Other reaction(s): Other (See Comments)  Dry Mouth  Other Reaction(s): Other (See Comments)  Dry Mouth at 25 mg dose. Tolerates 10 mg dose.    Dry mouth at 25 mg dose.  Tolerates 10 mg dose    Dry Mouth    Dry mouth at 25 mg dose.  Tolerates 10 mg dose Other reaction(s): Other (See Comments) Dry Mouth    Dry mouth at 25 mg dose.  Tolerates 10 mg dose Dry Mouth at 25 mg dose. Tolerates 10 mg dose. Dry mouth at 25 mg dose.  Tolerates 10 mg dose   Pregabalin Other (See Comments)    Ineffective  Other reaction(s): Other (See Comments)  Tardive dyskinesia  Other Reaction(s): Other (See Comments)  Ineffective    Tardive dyskinesia    Ineffective Other reaction(s): Other (See Comments) Tardive dyskinesia    Ineffective Ineffective   Ibuprofen Other (See Comments)    Contraindicated with Xarelto .    Sulfacetamide Sodium-Sulfur Rash    Family History:  Family History  Problem Relation Age of Onset   Stroke Maternal Grandfather        56   Heart attack Maternal Grandfather    Glaucoma Maternal Grandfather    Macular degeneration Maternal Grandfather    Breast cancer Sister    Hypertension Mother    Psoriasis Mother    Other Mother        meningioma developed ~2019   Glaucoma Mother    Cancer Paternal Grandfather    Heart attack Paternal Grandfather    Stroke Paternal Uncle        age 90   Polycythemia Paternal Uncle    Stroke Maternal Grandmother    Congestive Heart Failure Maternal Grandmother     Heart attack Maternal Grandmother    Protein C deficiency Sister 66       Miscarriages   Breast cancer Maternal Aunt 35     Current Outpatient Medications:    acyclovir  ointment (ZOVIRAX ) 5 %, Apply topically every 3 (three) hours. For acute breakout of cold sores for 7 days., Disp: 30 g, Rfl: 2   baclofen  (LIORESAL ) 20 MG tablet, Take 1-2 tablets (20-40 mg total) by mouth 4 (four) times daily. 1 tablet with breakfast, lunch, and dinner and 2 tablets at bedtime, Disp: 450 each, Rfl: 2   calcium carbonate (OS-CAL) 1250 (500 Ca) MG chewable tablet, Chew 1 tablet by mouth daily., Disp: , Rfl:    desonide  (DESOWEN ) 0.05 % cream, Apply to affected area(s) twice daily, Disp: 60 g, Rfl: 2   desvenlafaxine  (PRISTIQ ) 100 MG 24 hr tablet, Take 1 tablet (100 mg total) by mouth daily. (total dose 150mg ), Disp: 90 tablet, Rfl: 1   desvenlafaxine  (PRISTIQ ) 50 MG 24 hr tablet, Take 1 tablet (50 mg total) by mouth daily. Take with 100 mg tablet for a 150 mg total dose., Disp: 90 tablet, Rfl: 1   famotidine  (PEPCID ) 40 MG tablet, Take 1 tablet (40 mg total) by mouth nightly., Disp: 90 tablet, Rfl: 3   Fluocinolone  Acetonide 0.01 % OIL, Place 4 drops into both ears at bedtime for 14 days, THEN stop and use as needed for itching., Disp: 20 mL, Rfl: 1   folic acid  (KP FOLIC ACID ) 1 MG tablet, Take 1 tablet po  every day, Disp: 90 tablet, Rfl: 3   folic acid  (KP FOLIC ACID ) 1 MG tablet, Take 1 tablet (1 mg total) by mouth daily., Disp: 90 tablet, Rfl: 3   furosemide  (LASIX ) 20 MG tablet, Take 1 tablet (20 mg total) by mouth daily as needed for edema., Disp: 90 tablet, Rfl: 3   furosemide  (LASIX ) 80 MG tablet, Take 1 tablet (80 mg total) by mouth daily., Disp: 90 tablet, Rfl: 3   halobetasol  (ULTRAVATE ) 0.05 % ointment, Apply 1 Application topically 2 (two) times daily to hands, feet and knees, Disp: 50 g, Rfl: 5   hydrOXYzine  (ATARAX ) 25 MG tablet, Take 1 tablet (25 mg total) by mouth 4 (four) times daily as  needed for anxiety, Disp: 360 tablet, Rfl: 1   Loteprednol  Etabonate (LOTEMAX  SM) 0.38 % GEL, Place 1 drop into both eyes 3 (three) times daily., Disp: 10 g, Rfl: 1   metFORMIN  (GLUCOPHAGE ) 500 MG tablet, Take 1 tablet (500 mg total) by mouth 2 (two) times daily with a meal., Disp: 180 tablet, Rfl: 1   methotrexate  (RHEUMATREX) 2.5 MG tablet, Take 6 tablets (15 mg total) by mouth once a week., Disp: 72 tablet, Rfl: 0   morphine  (MS CONTIN ) 30 MG 12 hr tablet, Take 1 tablet (30 mg total) by mouth every 12 (twelve) hours., Disp: 60 tablet, Rfl: 0   ondansetron  (ZOFRAN -ODT) 4 MG disintegrating tablet, Take 1 tablet (4 mg total) by mouth every 8 (eight) hours as needed  as needed for nausea or vomiting., Disp: 20 tablet, Rfl: 0   OVER THE COUNTER MEDICATION, Tums-1000mg  once a day, Disp: , Rfl:    Oxycodone  HCl 10 MG TABS, Take 1 tablet (10 mg total) by mouth every 12 (twelve) hours as needed (pain)., Disp: 60 tablet, Rfl: 0   Pancrelipase , Lip-Prot-Amyl, (CREON ) 24000-76000 units CPEP, Take 2 capsules by mouth in the morning and 2 capsules at noon and 2 capsules in the evening. Take with meals., Disp: 180 capsule, Rfl: 5   phentermine  (ADIPEX-P ) 37.5 MG tablet, Take 1 tablet (37.5 mg total) by mouth daily before breakfast. Or 1/2 tablet twice a day., Disp: 30 tablet, Rfl: 0   potassium chloride  (KLOR-CON ) 10 MEQ tablet, Take 2 tablets by mouth every day., Disp: 180 tablet, Rfl: 2   RABEprazole  (ACIPHEX ) 20 MG tablet, Take 1 tablet (20 mg total) by mouth in the morning AND 1 tablet (20 mg total) every evening. Take before meals., Disp: 180 tablet, Rfl: 1   rivaroxaban  (XARELTO ) 20 MG TABS tablet, Take 1 tablet (20 mg total) by mouth Nightly., Disp: 90 tablet, Rfl: 3   Secukinumab , 300 MG Dose, (COSENTYX  SENSOREADY, 300 MG,) 150 MG/ML SOAJ, Inject 2(two) pens subcutaneous every 4 weeks, Disp: 2 mL, Rfl: 10   Tenapanor  HCl (IBSRELA ) 50 MG TABS, Take 50 mg by mouth 2 (two) times daily., Disp: 180 tablet, Rfl:  1   Testosterone  25 MG/2.5GM (1%) GEL, Apply a pea-sized amount topically to desired area once daily., Disp: 75 g, Rfl: 3   testosterone  cypionate (DEPOTESTOSTERONE CYPIONATE) 200 MG/ML injection, Inject 0.4 mLs (80 mg total) into the skin once a week., Disp: 12 mL, Rfl: 2   ursodiol  (ACTIGALL ) 250 MG tablet, Take 1 tablet (250 mg total) by mouth 2 (two) times daily., Disp: 180 tablet, Rfl: 2   ursodiol  (ACTIGALL ) 500 MG tablet, Take 1 tablet (500 mg total) by mouth 2 (two) times daily., Disp: 180 tablet, Rfl: 2   metoprolol  tartrate (LOPRESSOR ) 25 MG tablet, Take  1 tablet (25 mg total) by mouth 2 (two) times daily as needed., Disp: 30 tablet, Rfl: 3  Review of Systems:  Negative unless indicated in HPI.   Physical Exam: Vitals:   03/12/23 0931  BP: 102/70  Pulse: (!) 111  Temp: 98.3 F (36.8 C)  TempSrc: Oral  SpO2: 98%  Weight: 202 lb 1.6 oz (91.7 kg)    Body mass index is 31.65 kg/m.   Physical Exam Vitals reviewed.  Constitutional:      Appearance: Normal appearance.  HENT:     Right Ear: Tympanic membrane, ear canal and external ear normal.     Left Ear: Tympanic membrane, ear canal and external ear normal.     Mouth/Throat:     Mouth: Mucous membranes are moist.     Pharynx: Posterior oropharyngeal erythema present.  Eyes:     Conjunctiva/sclera: Conjunctivae normal.     Pupils: Pupils are equal, round, and reactive to light.  Cardiovascular:     Rate and Rhythm: Normal rate and regular rhythm.  Pulmonary:     Effort: Pulmonary effort is normal.     Breath sounds: Normal breath sounds.  Neurological:     Mental Status: He is alert.      Impression and Plan:  Viral upper respiratory tract infection   -Given exam findings, PNA, pharyngitis, ear infection are not likely, hence abx have not been prescribed. -Have advised rest, fluids, OTC antihistamines, cough suppressants and mucinex. -RTC if no improvement in 10-14 days.   Time spent:23 minutes  reviewing chart, interviewing and examining patient and formulating plan of care.     Tully Theophilus Andrews, MD [facility]

## 2023-03-13 ENCOUNTER — Other Ambulatory Visit (HOSPITAL_BASED_OUTPATIENT_CLINIC_OR_DEPARTMENT_OTHER): Payer: Self-pay

## 2023-03-13 DIAGNOSIS — G4731 Primary central sleep apnea: Secondary | ICD-10-CM | POA: Diagnosis not present

## 2023-03-13 MED ORDER — LEVOFLOXACIN 750 MG PO TABS
750.0000 mg | ORAL_TABLET | Freq: Every day | ORAL | 0 refills | Status: AC
  Filled 2023-03-13: qty 7, 7d supply, fill #0

## 2023-03-16 ENCOUNTER — Other Ambulatory Visit: Payer: Self-pay

## 2023-03-24 ENCOUNTER — Other Ambulatory Visit (HOSPITAL_COMMUNITY): Payer: Self-pay

## 2023-03-24 DIAGNOSIS — H6123 Impacted cerumen, bilateral: Secondary | ICD-10-CM | POA: Diagnosis not present

## 2023-03-24 DIAGNOSIS — H608X3 Other otitis externa, bilateral: Secondary | ICD-10-CM | POA: Diagnosis not present

## 2023-03-24 DIAGNOSIS — H903 Sensorineural hearing loss, bilateral: Secondary | ICD-10-CM | POA: Diagnosis not present

## 2023-03-25 ENCOUNTER — Other Ambulatory Visit: Payer: Self-pay

## 2023-03-25 ENCOUNTER — Other Ambulatory Visit (HOSPITAL_BASED_OUTPATIENT_CLINIC_OR_DEPARTMENT_OTHER): Payer: Self-pay

## 2023-03-25 DIAGNOSIS — F331 Major depressive disorder, recurrent, moderate: Secondary | ICD-10-CM | POA: Diagnosis not present

## 2023-03-26 ENCOUNTER — Other Ambulatory Visit (HOSPITAL_BASED_OUTPATIENT_CLINIC_OR_DEPARTMENT_OTHER): Payer: Self-pay

## 2023-04-01 ENCOUNTER — Ambulatory Visit: Payer: Commercial Managed Care - PPO | Admitting: Family Medicine

## 2023-04-01 ENCOUNTER — Other Ambulatory Visit: Payer: Self-pay

## 2023-04-01 ENCOUNTER — Other Ambulatory Visit (HOSPITAL_BASED_OUTPATIENT_CLINIC_OR_DEPARTMENT_OTHER): Payer: Self-pay

## 2023-04-01 ENCOUNTER — Other Ambulatory Visit: Payer: Self-pay | Admitting: Physical Medicine & Rehabilitation

## 2023-04-01 DIAGNOSIS — F331 Major depressive disorder, recurrent, moderate: Secondary | ICD-10-CM | POA: Diagnosis not present

## 2023-04-01 DIAGNOSIS — M47816 Spondylosis without myelopathy or radiculopathy, lumbar region: Secondary | ICD-10-CM

## 2023-04-01 MED ORDER — METHOTREXATE SODIUM 2.5 MG PO TABS
15.0000 mg | ORAL_TABLET | ORAL | 0 refills | Status: DC
  Filled 2023-04-01: qty 72, 84d supply, fill #0

## 2023-04-01 MED ORDER — MORPHINE SULFATE ER 30 MG PO TBCR
30.0000 mg | EXTENDED_RELEASE_TABLET | Freq: Two times a day (BID) | ORAL | 0 refills | Status: DC
  Filled 2023-04-01: qty 60, 30d supply, fill #0

## 2023-04-03 ENCOUNTER — Other Ambulatory Visit: Payer: Self-pay

## 2023-04-08 ENCOUNTER — Other Ambulatory Visit: Payer: Self-pay | Admitting: Family Medicine

## 2023-04-08 DIAGNOSIS — R748 Abnormal levels of other serum enzymes: Secondary | ICD-10-CM | POA: Diagnosis not present

## 2023-04-08 DIAGNOSIS — F331 Major depressive disorder, recurrent, moderate: Secondary | ICD-10-CM | POA: Diagnosis not present

## 2023-04-08 DIAGNOSIS — E669 Obesity, unspecified: Secondary | ICD-10-CM

## 2023-04-08 DIAGNOSIS — D751 Secondary polycythemia: Secondary | ICD-10-CM | POA: Diagnosis not present

## 2023-04-09 ENCOUNTER — Other Ambulatory Visit (HOSPITAL_BASED_OUTPATIENT_CLINIC_OR_DEPARTMENT_OTHER): Payer: Self-pay

## 2023-04-09 ENCOUNTER — Other Ambulatory Visit: Payer: Self-pay

## 2023-04-09 MED ORDER — PHENTERMINE HCL 37.5 MG PO TABS
37.5000 mg | ORAL_TABLET | Freq: Every day | ORAL | 0 refills | Status: DC
  Filled 2023-04-09: qty 30, 30d supply, fill #0

## 2023-04-13 ENCOUNTER — Other Ambulatory Visit: Payer: Self-pay

## 2023-04-14 ENCOUNTER — Other Ambulatory Visit (HOSPITAL_BASED_OUTPATIENT_CLINIC_OR_DEPARTMENT_OTHER): Payer: Self-pay

## 2023-04-14 ENCOUNTER — Other Ambulatory Visit: Payer: Self-pay

## 2023-04-14 ENCOUNTER — Encounter: Payer: Self-pay | Admitting: Family Medicine

## 2023-04-14 ENCOUNTER — Ambulatory Visit (INDEPENDENT_AMBULATORY_CARE_PROVIDER_SITE_OTHER): Payer: Commercial Managed Care - PPO | Admitting: Family Medicine

## 2023-04-14 ENCOUNTER — Other Ambulatory Visit (HOSPITAL_COMMUNITY): Payer: Self-pay

## 2023-04-14 VITALS — BP 112/80 | HR 97 | Temp 98.4°F | Ht 67.0 in | Wt 205.8 lb

## 2023-04-14 DIAGNOSIS — Z13220 Encounter for screening for lipoid disorders: Secondary | ICD-10-CM

## 2023-04-14 DIAGNOSIS — R635 Abnormal weight gain: Secondary | ICD-10-CM

## 2023-04-14 DIAGNOSIS — K900 Celiac disease: Secondary | ICD-10-CM | POA: Diagnosis not present

## 2023-04-14 DIAGNOSIS — T50905A Adverse effect of unspecified drugs, medicaments and biological substances, initial encounter: Secondary | ICD-10-CM | POA: Diagnosis not present

## 2023-04-14 DIAGNOSIS — Z0000 Encounter for general adult medical examination without abnormal findings: Secondary | ICD-10-CM

## 2023-04-14 LAB — LIPID PANEL
Cholesterol: 155 mg/dL (ref 0–200)
HDL: 52.4 mg/dL (ref >39.00)
LDL Cholesterol: 89 mg/dL (ref 0–99)
NonHDL: 102.9
Total CHOL/HDL Ratio: 3
Triglycerides: 69 mg/dL (ref 0.0–149.0)
VLDL: 13.8 mg/dL (ref 0.0–40.0)

## 2023-04-14 LAB — CBC WITH DIFFERENTIAL/PLATELET
Basophils Absolute: 0 10*3/uL (ref 0.0–0.1)
Basophils Relative: 0.1 % (ref 0.0–3.0)
Eosinophils Absolute: 0 10*3/uL (ref 0.0–0.7)
Eosinophils Relative: 0 % (ref 0.0–5.0)
HCT: 46.1 % (ref 39.0–52.0)
Hemoglobin: 14.9 g/dL (ref 13.0–17.0)
Lymphocytes Relative: 22.5 % (ref 12.0–46.0)
Lymphs Abs: 0.9 10*3/uL (ref 0.7–4.0)
MCHC: 32.2 g/dL (ref 30.0–36.0)
MCV: 82 fL (ref 78.0–100.0)
Monocytes Absolute: 0.3 10*3/uL (ref 0.1–1.0)
Monocytes Relative: 9 % (ref 3.0–12.0)
Neutro Abs: 2.6 10*3/uL (ref 1.4–7.7)
Neutrophils Relative %: 68.4 % (ref 43.0–77.0)
Platelets: 203 10*3/uL (ref 150.0–400.0)
RBC: 5.63 Mil/uL (ref 4.22–5.81)
RDW: 15.9 % — ABNORMAL HIGH (ref 11.5–15.5)
WBC: 3.8 10*3/uL — ABNORMAL LOW (ref 4.0–10.5)

## 2023-04-14 LAB — VITAMIN B12: Vitamin B-12: 437 pg/mL (ref 211–911)

## 2023-04-14 MED ORDER — METFORMIN HCL 1000 MG PO TABS
1000.0000 mg | ORAL_TABLET | Freq: Two times a day (BID) | ORAL | 1 refills | Status: DC
  Filled 2023-04-14 – 2023-05-08 (×4): qty 180, 90d supply, fill #0

## 2023-04-14 NOTE — Progress Notes (Signed)
Specialty Pharmacy Refill Coordination Note  Jesse Bowers is a 58 y.o. adult contacted today regarding refills of specialty medication(s) Secukinumab (Cosentyx Sensoready (300 MG))   Patient requested (Patient-Rptd) Pickup at [facility] at [facility] date: (Patient-Rptd) 04/22/23   Medication will be filled on 02.18.25.

## 2023-04-14 NOTE — Progress Notes (Signed)
Complete physical exam  Patient: Jesse Bowers   DOB: 10-Oct-1965   58 y.o. Adult  MRN: [id number]  Subjective:    Chief Complaint  Patient presents with   Annual Exam    Edger Husain is a 58 y.o. adult who presents today for a complete physical exam. He reports consuming a general diet. Home exercise routine includes walking 1.5 hrs per week. He generally feels fairly well. He reports sleeping fairly well. He does not have additional problems to discuss today.    Most recent fall risk assessment:    02/11/2023   11:15 AM  Fall Risk   Falls in the past year? 1  Number falls in past yr: 0  Injury with Fall? 1  Comment cut on head and wrist pain     Most recent depression screenings:    04/14/2023    9:23 AM 01/16/2023    2:31 PM  PHQ 2/9 Scores  PHQ - 2 Score 2 3  PHQ- 9 Score 7 13    Vision:Within last year and Dental: No current dental problems and Receives regular dental care  Patient Active Problem List   Diagnosis Date Noted   Thoracic spine pain 12/10/2022   Bilateral lumbar radiculopathy 10/31/2022   Lumbar disc disease 08/06/2022   Iliotibial band syndrome 08/06/2022   Non-recurrent acute suppurative otitis media of both ears without spontaneous rupture of tympanic membranes 07/09/2022   Weight gain due to medication 07/09/2022   Pancreatic insufficiency 04/09/2022   Posterior vitreous detachment of right eye 11/18/2021   Spondylosis of lumbar spine 10/03/2020   Pseudophakia of both eyes 05/07/2020   Retinal telangiectasis of both eyes 05/07/2020   Lattice degeneration of peripheral retina, left 09/08/2019   Lattice degeneration, right eye 09/08/2019   Chronic diastolic heart failure (HCC) 05/24/2019   Urinary dysfunction 04/12/2019   Osteoarthritis of right knee 08/23/2018   Constipation due to opioid therapy 03/30/2018   SNHL (sensorineural hearing loss) 12/04/2017   Abnormal urinary stream 12/03/2017   Osteoarthritis of carpometacarpal  (CMC) joint of thumb 11/30/2017   Muscle weakness 11/17/2017   Transient vision disturbance 11/12/2017   Bilateral hand pain 10/30/2017   Pain of left hip joint 10/09/2017   Gynecomastia 07/10/2017   Iron deficiency anemia 07/05/2017   Spasticity 05/20/2017   Lumbar radiculitis 04/20/2017   Ulnar 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, left 11/28/2016   Idiopathic peripheral neuropathy 11/28/2016   Failed total knee arthroplasty, 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   Long term (current) use of anticoagulants 08/23/2015   Right upper quadrant abdominal pain 08/23/2015   Memory loss 05/10/2015   Gait abnormality 04/07/2015   Alkaline phosphatase elevation 04/07/2015   History of thrombosis 03/26/2015   Medial epicondylitis 02/07/2015   Cognitive decline 12/21/2014   Leukopenia 12/05/2014   Rotator cuff syndrome of right shoulder 10/27/2014   Status post left knee replacement 08/22/2014   Left lateral epicondylitis 08/22/2014   Chronic cerebral ischemia 08/18/2014   Arthrofibrosis of knee joint 08/17/2014   Cubital canal compression syndrome, left 08/17/2014   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   Chronic pain syndrome 04/10/2014   Insomnia 04/10/2014   Chronic non-specific white matter lesions on MRI 04/10/2014   CFS (chronic fatigue syndrome) 04/10/2014   Biceps tendonitis on left 03/01/2014   Polycythemia vera (HCC) 12/27/2013   H/O TIA (transient ischemic attack) and stroke 12/27/2013   Neck pain 12/27/2013   OSA (obstructive sleep apnea) 12/08/2013   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/31/2013   Protein C deficiency (HCC) 08/01/2013  Post traumatic stress disorder (PTSD) 08/01/2013   Speech abnormality 07/25/2013   Obesity 05/10/2013   Lower extremity edema 05/10/2013   GERD (gastroesophageal reflux disease) 05/10/2013   Arthritis 05/10/2013   OA (osteoarthritis) of knee 03/15/2013   Headache 10/25/2012   Palpitations 10/18/2012   Fatty liver determined by biopsy 06/01/2012   Arthropathic psoriasis,  unspecified (HCC) 04/29/2012   Abnormal liver enzymes 03/29/2012   Psoriatic arthritis (HCC) 12/29/2011   Left Renal Hydronephrosis 12/11/2010   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   Celiac disease 05/27/2010   Thyroid nodule 05/27/2010   Male-to-male transgender person 09/20/2002      Patient Care Team: Karie Georges, MD as PCP - General (Family Medicine) Thurmon Fair, MD as PCP - Cardiology (Cardiology) Cay Schillings, MD (Inactive) as Consulting Physician (Hematology) Malva Cogan, MD as Referring Physician (Dermatology) Joellyn Rued, Nicky Pugh, MD as Referring Physician (Gastroenterology) Malachy Mood, MD as Consulting Physician (Hematology) Drema Halon, MD (Inactive) as Consulting Physician (Otolaryngology) Graylon Gunning, PhD as Consulting Physician (Psychology) Geraldine Contras, MD as Referring Physician (Psychiatry) Ranelle Oyster, MD as Consulting Physician (Physical Medicine and Rehabilitation) Francee Gentile, MD as Consulting Physician (Rheumatology) Ollen Gross, MD as Consulting Physician (Orthopedic Surgery) Francena Hanly, MD as Consulting Physician (Orthopedic Surgery) Vanna Scotland, MD as Consulting Physician (Urology) Sherren Mocha, MD (Family Medicine)   Outpatient Medications Prior to Visit  Medication Sig   baclofen (LIORESAL) 20 MG tablet Take 1-2 tablets (20-40 mg total) by mouth 4 (four) times daily. 1 tablet with breakfast, lunch, and dinner and 2 tablets at bedtime   calcium carbonate (OS-CAL) 1250 (500 Ca) MG chewable tablet Chew 1 tablet by mouth daily.   desonide (DESOWEN) 0.05 % cream Apply to affected area(s) twice daily   desvenlafaxine (PRISTIQ) 100 MG 24 hr tablet Take 1 tablet (100 mg total) by mouth daily. (total dose 150mg )   desvenlafaxine (PRISTIQ) 50 MG 24 hr tablet Take 1 tablet (50 mg total) by mouth daily. Take with 100 mg tablet for a 150 mg total dose.   famotidine (PEPCID) 40 MG tablet Take 1  tablet (40 mg total) by mouth nightly.   folic acid (KP FOLIC ACID) 1 MG tablet Take 1 tablet (1 mg total) by mouth daily.   furosemide (LASIX) 20 MG tablet Take 1 tablet (20 mg total) by mouth daily as needed for edema.   furosemide (LASIX) 80 MG tablet Take 1 tablet (80 mg total) by mouth daily.   halobetasol (ULTRAVATE) 0.05 % ointment Apply 1 Application topically 2 (two) times daily to hands, feet and knees   hydrOXYzine (ATARAX) 25 MG tablet Take 1 tablet (25 mg total) by mouth 4 (four) times daily as needed for anxiety   methotrexate (RHEUMATREX) 2.5 MG tablet Take 6 tablets (15 mg total) by mouth once a week.   morphine (MS CONTIN) 30 MG 12 hr tablet Take 1 tablet (30 mg total) by mouth every 12 (twelve) hours.   ondansetron (ZOFRAN-ODT) 4 MG disintegrating tablet Take 1 tablet (4 mg total) by mouth every 8 (eight) hours as needed  as needed for nausea or vomiting.   OVER THE COUNTER MEDICATION Tums-1000mg  once a day   Oxycodone HCl 10 MG TABS Take 1 tablet (10 mg total) by mouth every 12 (twelve) hours as needed (pain).   Pancrelipase, Lip-Prot-Amyl, (CREON) 24000-76000 units CPEP Take 2 capsules by mouth in the morning and 2 capsules at noon and 2 capsules in the evening. Take with meals.  phentermine (ADIPEX-P) 37.5 MG tablet Take 1 tablet (37.5 mg total) by mouth daily before breakfast. Or 1/2 tablet twice a day.   potassium chloride (KLOR-CON) 10 MEQ tablet Take 2 tablets by mouth every day.   RABEprazole (ACIPHEX) 20 MG tablet Take 1 tablet (20 mg total) by mouth in the morning AND 1 tablet (20 mg total) every evening. Take before meals.   rivaroxaban (XARELTO) 20 MG TABS tablet Take 1 tablet (20 mg total) by mouth Nightly.   Secukinumab, 300 MG Dose, (COSENTYX SENSOREADY, 300 MG,) 150 MG/ML SOAJ Inject 2(two) pens subcutaneous every 4 weeks   Tenapanor HCl (IBSRELA) 50 MG TABS Take 50 mg by mouth 2 (two) times daily.   Testosterone 25 MG/2.5GM (1%) GEL Apply a pea-sized amount  topically to desired area once daily.   testosterone cypionate (DEPOTESTOSTERONE CYPIONATE) 200 MG/ML injection Inject 0.4 mLs (80 mg total) into the skin once a week.   [DISCONTINUED] metFORMIN (GLUCOPHAGE) 500 MG tablet Take 1 tablet (500 mg total) by mouth 2 (two) times daily with a meal.   metoprolol tartrate (LOPRESSOR) 25 MG tablet Take 1 tablet (25 mg total) by mouth 2 (two) times daily as needed.   [DISCONTINUED] acyclovir ointment (ZOVIRAX) 5 % Apply topically every 3 (three) hours. For acute breakout of cold sores for 7 days.   [DISCONTINUED] folic acid (KP FOLIC ACID) 1 MG tablet Take 1 tablet po every day   [DISCONTINUED] Loteprednol Etabonate (LOTEMAX SM) 0.38 % GEL Place 1 drop into both eyes 3 (three) times daily.   [DISCONTINUED] ursodiol (ACTIGALL) 250 MG tablet Take 1 tablet (250 mg total) by mouth 2 (two) times daily.   [DISCONTINUED] ursodiol (ACTIGALL) 500 MG tablet Take 1 tablet (500 mg total) by mouth 2 (two) times daily.   No facility-administered medications prior to visit.    Review of Systems  HENT:  Negative for hearing loss.   Eyes:  Negative for blurred vision.  Respiratory:  Negative for shortness of breath.   Cardiovascular:  Negative for chest pain.  Gastrointestinal: Negative.   Genitourinary: Negative.   Musculoskeletal:  Negative for back pain.  Neurological:  Negative for headaches.  Psychiatric/Behavioral:  Negative for depression.        Objective:     BP 112/80   Pulse 97   Temp 98.4 F (36.9 C) (Oral)   Ht 5\' 7"  (1.702 m)   Wt 205 lb 12.8 oz (93.4 kg)   SpO2 97%   BMI 32.23 kg/m    Physical Exam Vitals reviewed.  Constitutional:      Appearance: Normal appearance. He is well-groomed. He is obese.  HENT:     Mouth/Throat:     Mouth: Mucous membranes are moist.     Pharynx: No posterior oropharyngeal erythema.  Eyes:     Conjunctiva/sclera: Conjunctivae normal.  Neck:     Thyroid: No thyromegaly.  Cardiovascular:     Rate and  Rhythm: Normal rate and regular rhythm.     Pulses: Normal pulses.     Heart sounds: S1 normal and S2 normal.  Pulmonary:     Effort: Pulmonary effort is normal.     Breath sounds: Normal breath sounds and air entry.  Abdominal:     General: Abdomen is flat. Bowel sounds are normal.     Palpations: Abdomen is soft.  Musculoskeletal:     Right lower leg: No edema.     Left lower leg: No edema.  Lymphadenopathy:     Cervical: No cervical  adenopathy.  Neurological:     Mental Status: He is alert and oriented to person, place, and time. Mental status is at baseline.     Gait: Gait is intact.  Psychiatric:        Mood and Affect: Mood and affect normal.        Speech: Speech normal.        Behavior: Behavior normal.        Judgment: Judgment normal.     No results found for any visits on 04/14/23.     Assessment & Plan:    Routine Health Maintenance and Physical Exam  Immunization History  Administered Date(s) Administered   Hepb-cpg 03/06/2021, 04/03/2021   Influenza Split 11/29/2010, 12/15/2011, 12/10/2012, 11/16/2013   Influenza, Seasonal, Injecte, Preservative Fre 11/15/2014, 01/03/2016   Influenza,inj,Quad PF,6+ Mos 11/15/2014, 01/03/2016, 11/27/2016, 12/22/2017, 10/29/2018, 11/24/2018   Influenza,inj,quad, With Preservative 12/01/2016   Influenza-Unspecified 12/01/2013, 01/03/2016, 11/28/2016, 12/22/2017, 12/06/2019, 11/24/2020, 11/24/2020, 11/01/2021, 12/09/2021, 11/21/2022   Moderna Covid-19 Fall Seasonal Vaccine 11yrs & older 12/09/2021, 11/21/2022   Moderna Sars-Covid-2 Vaccination 08/01/2020, 11/24/2020, 11/01/2021   PFIZER(Purple Top)SARS-COV-2 Vaccination 05/25/2019, 06/22/2019, 11/04/2019   PNEUMOCOCCAL CONJUGATE-20 02/28/2021, 03/04/2022   Pfizer Covid-19 Vaccine Bivalent Booster 78yrs & up 06/22/2019, 02/28/2021   Pneumococcal Conjugate-13 12/01/2013   Td 03/03/2006, 03/02/2014   Td (Adult),5 Lf Tetanus Toxid, Preservative Free 03/02/2014   Tdap 03/02/2014     Health Maintenance  Topic Date Due   COVID-19 Vaccine (10 - 2024-25 season) 01/16/2023   HIV Screening  04/13/2024 (Originally 09/24/1980)   DTaP/Tdap/Td (5 - Td or Tdap) 03/02/2024   Colonoscopy  05/02/2030   Pneumococcal Vaccine 63-30 Years old  Completed   INFLUENZA VACCINE  Completed   Hepatitis C Screening  Completed   Zoster Vaccines- Shingrix  Completed   HPV VACCINES  Aged Out   MAMMOGRAM  Discontinued    Discussed health benefits of physical activity, and encouraged him to engage in regular exercise appropriate for his age and condition.  Celiac disease -     Vitamin B12; Future  Lipid screening -     Lipid panel; Future  Weight gain due to medication -     metFORMIN HCl; Take 1 tablet (1,000 mg total) by mouth 2 (two) times daily with a meal.  Dispense: 180 tablet; Refill: 1  Routine general medical examination at a health care facility -     CBC with Differential/Platelet; Future  Physical exam findings are stable from previous, will obtain CBC and lipid panel today for his surveillance. Of note his CVA was from his pre-existing hypercoagulable state, not from CVD. I counseled the patient on his overall cardiac risk which when calculated is still very low-- around 4%.  I do not recommend statin medication at this time. Handouts given on healthy eating and exercise, I counseled him to try increasing low impact exercise as tolerated.   Return in about 3 months (around 07/12/2023) for weight loss.     Karie Georges, MD

## 2023-04-14 NOTE — Patient Instructions (Addendum)
[email or web address] [facility] [employer] website  Health Maintenance, Male Adopting a healthy lifestyle and getting preventive care are important in promoting health and wellness. Ask your health care provider about: The right schedule for you to have regular tests and exams. Things you can do on your own to prevent diseases and keep yourself healthy. What should I know about diet, weight, and exercise? Eat a healthy diet  Eat a diet that includes plenty of vegetables, fruits, low-fat dairy products, and lean protein. Do not eat a lot of foods that are high in solid fats, added sugars, or sodium. Maintain a healthy weight Body mass index (BMI) is a measurement that can be used to identify possible weight problems. It estimates body fat based on height and weight. Your health care provider can help determine your BMI and help you achieve or maintain a healthy weight. Get regular exercise Get regular exercise. This is one of the most important things you can do for your health. Most adults should: Exercise for at least 150 minutes each week. The exercise should increase your heart rate and make you sweat (moderate-intensity exercise). Do strengthening exercises at least twice a week. This is in addition to the moderate-intensity exercise. Spend less time sitting. Even light physical activity can be beneficial. Watch cholesterol and blood lipids Have your blood tested for lipids and cholesterol at 58 years of age, then have this test every 5 years. You may need to have your cholesterol levels checked more often if: Your lipid or cholesterol levels are high. You are older than 58 years of age. You are at high risk for heart disease. What should I know about cancer screening? Many types of cancers can be detected early and may often be prevented. Depending on your health history and family history, you may need to have cancer screening at various ages. This  may include screening for: Colorectal cancer. Prostate cancer. Skin cancer. Lung cancer. What should I know about heart disease, diabetes, and high blood pressure? Blood pressure and heart disease High blood pressure causes heart disease and increases the risk of stroke. This is more likely to develop in people who have high blood pressure readings or are overweight. Talk with your health care provider about your target blood pressure readings. Have your blood pressure checked: Every 3-5 years if you are 58-58 years of age. Every year if you are 58 years old or older. If you are between the ages of 58 and 75 and are a current or former smoker, ask your health care provider if you should have a one-time screening for abdominal aortic aneurysm (AAA). Diabetes Have regular diabetes screenings. This checks your fasting blood sugar level. Have the screening done: Once every three years after age 58 if you are at a normal weight and have a low risk for diabetes. More often and at a younger age if you are overweight or have a high risk for diabetes. What should I know about preventing infection? Hepatitis B If you have a higher risk for hepatitis B, you should be screened for this virus. Talk with your health care provider to find out if you are at risk for hepatitis B infection. Hepatitis C Blood testing is recommended for: Everyone born from [birth year] through 1965. Anyone with known risk factors for hepatitis C. Sexually transmitted infections (STIs) You should be screened each year for STIs, including gonorrhea and chlamydia, if: You are sexually active and are younger than 58 years of age.  You are older than 58 years of age and your health care provider tells you that you are at risk for this type of infection. Your sexual activity has changed since you were last screened, and you are at increased risk for chlamydia or gonorrhea. Ask your health care provider if you are at risk. Ask your health  care provider about whether you are at high risk for HIV. Your health care provider may recommend a prescription medicine to help prevent HIV infection. If you choose to take medicine to prevent HIV, you should first get tested for HIV. You should then be tested every 3 months for as long as you are taking the medicine. Follow these instructions at home: Alcohol use Do not drink alcohol if your health care provider tells you not to drink. If you drink alcohol: Limit how much you have to 0-2 drinks a day. Know how much alcohol is in your drink. In the [place] one drink equals one 12 oz bottle of beer (355 mL), one 5 oz glass of wine (148 mL), or one 1 oz glass of hard liquor (44 mL). Lifestyle Do not use any products that contain nicotine or tobacco. These products include cigarettes, chewing tobacco, and vaping devices, such as e-cigarettes. If you need help quitting, ask your health care provider. Do not use street drugs. Do not share needles. Ask your health care provider for help if you need support or information about quitting drugs. General instructions Schedule regular health, dental, and eye exams. Stay current with your vaccines. Tell your health care provider if: You often feel depressed. You have ever been abused or do not feel safe at home. Summary Adopting a healthy lifestyle and getting preventive care are important in promoting health and wellness. Follow your health care provider's instructions about healthy diet, exercising, and getting tested or screened for diseases. Follow your health care provider's instructions on monitoring your cholesterol and blood pressure. This information is not intended to replace advice given to you by your health care provider. Make sure you discuss any questions you have with your health care provider. Document Revised: 07/09/2020 Document Reviewed: 07/09/2020 Elsevier Patient Education  2024 [employer].

## 2023-04-15 ENCOUNTER — Other Ambulatory Visit: Payer: Self-pay

## 2023-04-15 ENCOUNTER — Encounter: Payer: Self-pay | Admitting: Physical Medicine & Rehabilitation

## 2023-04-15 ENCOUNTER — Ambulatory Visit
Admission: RE | Admit: 2023-04-15 | Discharge: 2023-04-15 | Disposition: A | Discharge status: Home / Self Care | Payer: Commercial Managed Care - PPO | Source: Ambulatory Visit | Attending: Physical Medicine & Rehabilitation | Admitting: Physical Medicine & Rehabilitation

## 2023-04-15 ENCOUNTER — Encounter
Payer: Commercial Managed Care - PPO | Attending: Physical Medicine & Rehabilitation | Admitting: Physical Medicine & Rehabilitation

## 2023-04-15 ENCOUNTER — Encounter (HOSPITAL_COMMUNITY): Payer: Self-pay

## 2023-04-15 ENCOUNTER — Other Ambulatory Visit (HOSPITAL_BASED_OUTPATIENT_CLINIC_OR_DEPARTMENT_OTHER): Payer: Self-pay

## 2023-04-15 VITALS — BP 113/80 | HR 107 | Ht 67.0 in | Wt 205.2 lb

## 2023-04-15 DIAGNOSIS — M1990 Unspecified osteoarthritis, unspecified site: Secondary | ICD-10-CM | POA: Insufficient documentation

## 2023-04-15 DIAGNOSIS — M1712 Unilateral primary osteoarthritis, left knee: Secondary | ICD-10-CM | POA: Diagnosis not present

## 2023-04-15 DIAGNOSIS — M5416 Radiculopathy, lumbar region: Secondary | ICD-10-CM | POA: Insufficient documentation

## 2023-04-15 DIAGNOSIS — Z96652 Presence of left artificial knee joint: Secondary | ICD-10-CM | POA: Diagnosis not present

## 2023-04-15 DIAGNOSIS — G609 Hereditary and idiopathic neuropathy, unspecified: Secondary | ICD-10-CM | POA: Insufficient documentation

## 2023-04-15 DIAGNOSIS — M25562 Pain in left knee: Secondary | ICD-10-CM | POA: Diagnosis not present

## 2023-04-15 DIAGNOSIS — F331 Major depressive disorder, recurrent, moderate: Secondary | ICD-10-CM | POA: Diagnosis not present

## 2023-04-15 DIAGNOSIS — M47816 Spondylosis without myelopathy or radiculopathy, lumbar region: Secondary | ICD-10-CM | POA: Insufficient documentation

## 2023-04-15 DIAGNOSIS — G894 Chronic pain syndrome: Secondary | ICD-10-CM | POA: Diagnosis not present

## 2023-04-15 DIAGNOSIS — L4050 Arthropathic psoriasis, unspecified: Secondary | ICD-10-CM | POA: Diagnosis not present

## 2023-04-15 MED ORDER — MORPHINE SULFATE ER 30 MG PO TBCR
30.0000 mg | EXTENDED_RELEASE_TABLET | Freq: Two times a day (BID) | ORAL | 0 refills | Status: DC
  Filled 2023-04-15 – 2023-05-01 (×4): qty 60, 30d supply, fill #0

## 2023-04-15 MED ORDER — OXYCODONE HCL 10 MG PO TABS
10.0000 mg | ORAL_TABLET | Freq: Two times a day (BID) | ORAL | 0 refills | Status: DC | PRN
  Filled 2023-04-15: qty 60, 30d supply, fill #0

## 2023-04-15 NOTE — Patient Instructions (Signed)
ALWAYS FEEL FREE TO CALL OUR OFFICE WITH ANY PROBLEMS OR QUESTIONS [phone])  **PLEASE NOTE** ALL MEDICATION REFILL REQUESTS (INCLUDING CONTROLLED SUBSTANCES) NEED TO BE MADE AT LEAST 7 DAYS PRIOR TO REFILL BEING DUE. ANY REFILL REQUESTS INSIDE THAT TIME FRAME MAY RESULT IN DELAYS IN RECEIVING YOUR PRESCRIPTION.

## 2023-04-15 NOTE — Telephone Encounter (Signed)
See previous message

## 2023-04-15 NOTE — Progress Notes (Signed)
Subjective:    Patient ID: Jesse Bowers, male   DOB: 1965/12/28, 57 y.o.   MRN: [id number]  HPI  Jesse Bowers is here in follow up of his chronic pain and gait issues. His back is holding up although he reports pain behind his left knee/calf. This seems to have worsened gradually since the last back injection. He uses a cane to offload. Because of his cane use, he had surgery to remove loose bodies in his right wrist which was becoming pain. It seems to have helped pain there.   Otherwise his pain levels are stable. He is using Ms Contin 30 q12 and oxycodone 10mg  once or twice daily.   He denies any new health issues. He is a bit discouraged by the recent socio-economic climate driven by this new presidential administration.     Pain Inventory Average Pain 6 Pain Right Now 5 My pain is constant, sharp, stabbing, tingling, and aching  In the last 24 hours, has pain interfered with the following? General activity 5 Relation with others 5 Enjoyment of life 5 What TIME of day is your pain at its worst? varies Sleep (in general) Fair  Pain is worse with: walking and some activites Pain improves with: medication Relief from Meds: 8  Family History  Problem Relation Age of Onset   Stroke Maternal Grandfather        78   Heart attack Maternal Grandfather    Glaucoma Maternal Grandfather    Macular degeneration Maternal Grandfather    Breast cancer Sister    Hypertension Mother    Psoriasis Mother    Other Mother        meningioma developed ~2019   Glaucoma Mother    Cancer Paternal Grandfather    Heart attack Paternal Grandfather    Stroke Paternal Uncle        age 40   Polycythemia Paternal Uncle    Stroke Maternal Grandmother    Congestive Heart Failure Maternal Grandmother    Heart attack Maternal Grandmother    Protein C deficiency Sister 74       Miscarriages   Breast cancer Maternal Aunt 35   Social History   Socioeconomic History   Marital status: Married     Spouse name: Not on file   Number of children: 2   Years of education: 4y college   Highest education level: Master's degree (e.g., MA, MS, MEng, MEd, MSW, MBA)  Occupational History   Occupation: Restaurant manager, fast food    Comment: Not working since CVA 2015  Tobacco Use   Smoking status: Never   Smokeless tobacco: Never  Vaping Use   Vaping status: Never Used  Substance and Sexual Activity   Alcohol use: Yes    Comment: social   Drug use: No   Sexual activity: Yes    Birth control/protection: None    Comment: patient is a transgender on testosterone shots, no biological kids  Other Topics Concern   Not on file  Social History Narrative   Not on file   Social Drivers of Health   Financial Resource Strain: Low Risk  (03/12/2023)   Overall Financial Resource Strain (CARDIA)    Difficulty of Paying Living Expenses: Not very hard  Recent Concern: Financial Resource Strain - High Risk (12/12/2022)   Overall Financial Resource Strain (CARDIA)    Difficulty of Paying Living Expenses: Hard  Food Insecurity: No Food Insecurity (03/12/2023)   Hunger Vital Sign    Worried About Running Out of  Food in the Last Year: Never true    Ran Out of Food in the Last Year: Never true  Recent Concern: Food Insecurity - Food Insecurity Present (01/14/2023)   Received from [facility]   Hunger Vital Sign    Worried About Running Out of Food in the Last Year: Never true    Ran Out of Food in the Last Year: Sometimes true  Transportation Needs: Unmet Transportation Needs (03/12/2023)   PRAPARE - Transportation    Lack of Transportation (Medical): Yes    Lack of Transportation (Non-Medical): Yes  Physical Activity: Unknown (03/12/2023)   Exercise Vital Sign    Days of Exercise per Week: 0 days    Minutes of Exercise per Session: Not on file  Stress: Stress Concern Present (03/12/2023)   [employer] of Occupational Health - Occupational Stress Questionnaire    Feeling of Stress : Rather  much  Social Connections: Socially Isolated (03/12/2023)   Social Connection and Isolation Panel [NHANES]    Frequency of Communication with Friends and Family: Never    Frequency of Social Gatherings with Friends and Family: Never    Attends Religious Services: Never    Database administrator or Organizations: No    Attends Engineer, structural: Not on file    Marital Status: Married   Past Surgical History:  Procedure Laterality Date   ABDOMINAL HYSTERECTOMY Bilateral 1994   TAH, BSO- tranverse incision at 58 yo   ANKLE ARTHROSCOPY WITH RECONSTRUCTION Right 2007   CHOLECYSTECTOMY     laparoscopic   COLONOSCOPY     x3   EYE SURGERY     Left eye 03/02/2018, right 02/15/2018   HEMORROIDECTOMY  08/19/2020   HIP ARTHROSCOPY W/ LABRAL REPAIR Right 05/11/2013   acetabular labral tear 03/30/2013   KNEE ARTHROPLASTY Right    KNEE JOINT MANIPULATION Left    x3 under anesthesia   KNEE SURGERY Bilateral 1984   Right ACL, left PCL repair   LITHOTRIPSY  2005   LIVER BIOPSY  2013   normal results.   MASTECTOMY Bilateral    prior to 2009   MOUTH SURGERY     NASAL SEPTUM SURGERY N/A 09/20/2015   by ENT Dr. Ezzard Standing   OVARIAN CYST SURGERY Left    size of grapefruit, was informed that she had shortened vagina   SHOULDER SURGERY Bilateral    Right 08/15/2016, Left 11/15/2016   THUMB ARTHROSCOPY Left    THYROIDECTOMY, PARTIAL Left 2008   TOTAL KNEE ARTHROPLASTY Right 08/23/2018   Procedure: TOTAL KNEE ARTHROPLASTY;  Surgeon: Ollen Gross, MD;  Location: [facility];  Service: Orthopedics;  Laterality: Right;    TOTAL KNEE REVISION Left 02/06/2016   Procedure: LEFT TOTAL KNEE REVISION;  Surgeon: Ollen Gross, MD;  Location: [facility];  Service: Orthopedics;  Laterality: Left;   TOTAL KNEE REVISION Left 04/22/2017   Procedure: Left knee polyethylene revision;  Surgeon: Ollen Gross, MD;  Location: [facility];  Service: Orthopedics;  Laterality: Left;   UPPER GI ENDOSCOPY  2003   Past  Surgical History:  Procedure Laterality Date   ABDOMINAL HYSTERECTOMY Bilateral 1994   TAH, BSO- tranverse incision at 58 yo   ANKLE ARTHROSCOPY WITH RECONSTRUCTION Right 2007   CHOLECYSTECTOMY     laparoscopic   COLONOSCOPY     x3   EYE SURGERY     Left eye 03/02/2018, right 02/15/2018   HEMORROIDECTOMY  08/19/2020   HIP ARTHROSCOPY W/ LABRAL REPAIR Right 05/11/2013   acetabular labral  tear 03/30/2013   KNEE ARTHROPLASTY Right    KNEE JOINT MANIPULATION Left    x3 under anesthesia   KNEE SURGERY Bilateral 1984   Right ACL, left PCL repair   LITHOTRIPSY  2005   LIVER BIOPSY  2013   normal results.   MASTECTOMY Bilateral    prior to 2009   MOUTH SURGERY     NASAL SEPTUM SURGERY N/A 09/20/2015   by ENT Dr. Ezzard Standing   OVARIAN CYST SURGERY Left    size of grapefruit, was informed that she had shortened vagina   SHOULDER SURGERY Bilateral    Right 08/15/2016, Left 11/15/2016   THUMB ARTHROSCOPY Left    THYROIDECTOMY, PARTIAL Left 2008   TOTAL KNEE ARTHROPLASTY Right 08/23/2018   Procedure: TOTAL KNEE ARTHROPLASTY;  Surgeon: Ollen Gross, MD;  Location: [facility];  Service: Orthopedics;  Laterality: Right;    TOTAL KNEE REVISION Left 02/06/2016   Procedure: LEFT TOTAL KNEE REVISION;  Surgeon: Ollen Gross, MD;  Location: [facility];  Service: Orthopedics;  Laterality: Left;   TOTAL KNEE REVISION Left 04/22/2017   Procedure: Left knee polyethylene revision;  Surgeon: Ollen Gross, MD;  Location: [facility];  Service: Orthopedics;  Laterality: Left;   UPPER GI ENDOSCOPY  2003   Past Medical History:  Diagnosis Date   Abnormal weight loss    Anxiety    Arthritis    Cataract    OU   Celiac disease    Cervical neck pain with evidence of disc disease    patient has a cyst    Chronic constipation    Chronic diastolic heart failure (HCC)    Pt. 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   Family history of adverse reaction to anesthesia    family has problems with anesthesia of nausea and vomiting    GERD (gastroesophageal reflux disease)    History of in 20's   Gluten enteropathy    H/O parotitis    right    Hard of hearing    History of kidney stones    History of retinal tear    Bilateral   History of staph infection    required wound vac   Hx-TIA (transient ischemic attack)    2015   Kidney stones 08/2020   LVH (left ventricular hypertrophy) 12/15/2016   Mild, noted on ECHO   MVP (mitral valve prolapse)    NAFL (nonalcoholic fatty liver)    Neck pain    Neuromuscular disorder (HCC)    bilateral neuropathy feet.   Nuclear sclerotic cataract of both eyes 09/08/2019   Pneumonia 12/17/2010   Polycythemia    Polycythemia, secondary    PONV (postoperative nausea and vomiting)    Protein C deficiency (HCC)    Dr. Thomes Cake   Psoriasis    16 X10 cm psoriatic rash on sole of left foot ; open and occ scant bleeding;    psoriatic arthritis    PTSD (post-traumatic stress disorder)    Scaphoid fracture of wrist 09/23/2013   Seizure (HCC)    childhood, medication until age 80 then weanned completely off   Sleep apnea    split night study last done by Dr. Epimenio Foot 06/18/15 shows severe  OSA, CSA, and hypersomnia, rec bipap   Splenomegaly    Stenosis of ureteropelvic junction (UPJ)    left   Stroke [facility])    CVA vs TIA in left cerebrum causing slight right sided weakness-Dr. 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    Wears glasses    Wears hearing aid    BP 113/80   Pulse (!) 107   Ht 5\' 7"  (1.702 m)   Wt 205 lb 3.2 oz (93.1 kg)   SpO2  99%   BMI 32.14 kg/m   Opioid Risk Score:   Fall Risk Score:  `1  Depression screen [facility] 2/9     04/14/2023    9:23 AM 01/16/2023    2:31 PM 12/16/2022   12:59 PM 12/10/2022   10:48 AM 08/06/2022    3:14 PM 07/07/2022    1:32 PM 06/12/2022    2:24 PM  Depression screen PHQ 2/9  Decreased Interest 1 1 1  0 0 1 0  Down, Depressed, Hopeless 1 2 1  0 0 1 0  PHQ - 2 Score 2 3 2  0 0 2 0  Altered sleeping 3 3 1   1 2   Tired, decreased energy 1 1 1     0  Change in appetite 0 0 1   0 0  Feeling bad or failure about yourself  0 3 0   0 0  Trouble concentrating 1 3 1   2 2   Moving slowly or fidgety/restless 0 0 0   0 0  Suicidal thoughts 0 0 0   0 0  PHQ-9 Score 7 13 6   5 4   Difficult doing work/chores Somewhat difficult Very difficult Somewhat difficult   Somewhat difficult      Review of Systems  Musculoskeletal:  Positive for back pain, gait problem and neck pain.       Left knee pain Back of left leg to back of knee pain Fingers pain Toes pain  All other systems reviewed and are negative.     Objective:   Physical Exam General: No acute distress HEENT: NCAT, EOMI, oral membranes moist Cards: reg rate  Chest: normal effort Abdomen: Soft, NT, ND Skin: dry, intact Extremities: no edema Psych: pleasant and appropriate  Skin: intact Neuro: Alert and oriented x 3. Normal insight and awareness. Intact Memory. Normal language and speech. Cranial nerve exam unremarkable. LE motor 5/5 LLE and 4-5/ RLe with more weakness in ADF than anywhere else. He has stocking glove sensory loss in both feet below ankles as well as sensory loss along left lateral leg. Right 2-4 toes more affected. Tends to land farther ahead with left foot than right.    No abnl resting tone although hamstring appeared a little tight. He uses cane to help offload left leg. Musculoskeletal:  tender in popliteal fossa. ?baker's cyst with effusion. Painful to palpation and with standing. Not with simple stretching, SLR or  SST. Strength seems preserved.            Assessment & Plan:  1. Psoriatic arthritis with pain in multiple areas, most prominently feet, hands, elbows. Pain also related to his prior CVA and associated motor/sensory change   2. 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 3. Hx of left total knee revision again on 04/21/17 per [facility] and right TKA 08/2018 thru [facility].   -popliteal swelling on left, baker's cyst. I don't think this is referred pain from his back  -recommend ice, consider aspiration/injection  -check xray of left knee  -could see ortho for follow up of this as well.  4. Chronic  low back pain---MRI with severe DDD at L5-S1. Left S1 radiculopathy? only mild foraminal stenosis however -S/P left translaminar L5-S1 ESI by Dr. Wynn Banker.   -improved pain control -continue HEP -nexwave --TENS for back pain   5. Protein C deficiency   6. Left shoulder subluxation, bicipital tendonitis 7. Central sleep apnea 8. Chronic opioid use for pain             -continue ms contin 30mg  q12 hours. Today RF #60 for end of the month.             -continue oxycodone 10mg  q12 prn with goal of every day prn he has been using this fairly sparingly.  If he needs to take it more frequently postoperatively next week that is fine.             -We will continue the controlled substance monitoring program, this consists of regular clinic visits, examinations, routine drug screening, pill counts as well as use of [place] Controlled Substance Reporting System. [place] today.    9. Depression with anxiety.  0. Left lateral epidondylitis  11. 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  has had right and left sided weakness with sensory loss in C4-6 dermatomes.. Most recent cervical MRI without significant change from 2019 study             -expressed to him that we  wouldn't engage surgery given the stable presence of the last scan.  We can recheck an MRI based on his clinical presentation.     -neuro exam stable. 12. 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 OINC 14. Right wrist psoriatic arthritis, tendonapthy, ganglion cysts s/p multiple recent surgeries.  s/p right thumb replacement             -is followed by Dr. Orlan Leavens at [facility]--             -arthroscopic surgery right wrist complete 15. Recent gender reconstruction surgery--progressing   20 minutes of face to face patient care time were spent during this visit. All questions were encouraged and answered. Follow up with me in about 2 months.

## 2023-04-15 NOTE — Progress Notes (Signed)
Specialty Pharmacy Ongoing Clinical Assessment Note  Jesse Bowers is a 58 y.o. adult who is being followed by the specialty pharmacy service for RxSp Rheumatoid Arthritis   Patient's specialty medication(s) reviewed today: Secukinumab (Cosentyx Sensoready (300 MG))   Missed doses in the last 4 weeks: 0   Patient/Caregiver asked additional questions regarding if in the future he may be able to receive Cosentyx pre-filled syringes and not the auto-injectors.  Therapeutic benefit summary: Patient is achieving benefit   Adverse events/side effects summary: No adverse events/side effects   Patient's therapy is appropriate to: Continue    Goals Addressed             This Visit's Progress    Minimize recurrence of flares       Patient is on track. Patient will maintain adherence         Follow up:  6 months  Jesse Bowers [facility] Specialty Pharmacist

## 2023-04-21 ENCOUNTER — Other Ambulatory Visit: Payer: Self-pay

## 2023-04-22 DIAGNOSIS — F331 Major depressive disorder, recurrent, moderate: Secondary | ICD-10-CM | POA: Diagnosis not present

## 2023-04-23 ENCOUNTER — Other Ambulatory Visit (HOSPITAL_BASED_OUTPATIENT_CLINIC_OR_DEPARTMENT_OTHER): Payer: Self-pay

## 2023-04-23 ENCOUNTER — Other Ambulatory Visit: Payer: Self-pay

## 2023-04-27 ENCOUNTER — Other Ambulatory Visit (HOSPITAL_COMMUNITY): Payer: Self-pay

## 2023-04-27 ENCOUNTER — Other Ambulatory Visit (HOSPITAL_BASED_OUTPATIENT_CLINIC_OR_DEPARTMENT_OTHER): Payer: Self-pay

## 2023-04-27 ENCOUNTER — Other Ambulatory Visit: Payer: Self-pay

## 2023-04-27 DIAGNOSIS — H903 Sensorineural hearing loss, bilateral: Secondary | ICD-10-CM | POA: Diagnosis not present

## 2023-04-27 DIAGNOSIS — H608X3 Other otitis externa, bilateral: Secondary | ICD-10-CM | POA: Diagnosis not present

## 2023-04-28 ENCOUNTER — Other Ambulatory Visit (HOSPITAL_BASED_OUTPATIENT_CLINIC_OR_DEPARTMENT_OTHER): Payer: Self-pay

## 2023-04-28 MED ORDER — TESTOSTERONE 25 MG/2.5GM (1%) TD GEL
2.5000 g | Freq: Every day | TRANSDERMAL | 3 refills | Status: DC
  Filled 2023-04-28: qty 75, 30d supply, fill #0

## 2023-04-29 ENCOUNTER — Other Ambulatory Visit (HOSPITAL_BASED_OUTPATIENT_CLINIC_OR_DEPARTMENT_OTHER): Payer: Self-pay

## 2023-04-29 ENCOUNTER — Other Ambulatory Visit: Payer: Self-pay

## 2023-04-29 DIAGNOSIS — F4310 Post-traumatic stress disorder, unspecified: Secondary | ICD-10-CM | POA: Diagnosis not present

## 2023-04-29 DIAGNOSIS — F411 Generalized anxiety disorder: Secondary | ICD-10-CM | POA: Diagnosis not present

## 2023-04-29 DIAGNOSIS — F5101 Primary insomnia: Secondary | ICD-10-CM | POA: Diagnosis not present

## 2023-04-29 DIAGNOSIS — F09 Unspecified mental disorder due to known physiological condition: Secondary | ICD-10-CM | POA: Diagnosis not present

## 2023-04-29 DIAGNOSIS — F331 Major depressive disorder, recurrent, moderate: Secondary | ICD-10-CM | POA: Diagnosis not present

## 2023-04-29 DIAGNOSIS — F32A Depression, unspecified: Secondary | ICD-10-CM | POA: Diagnosis not present

## 2023-04-29 MED ORDER — VILAZODONE HCL 10 MG PO TABS
ORAL_TABLET | ORAL | 5 refills | Status: DC
  Filled 2023-04-29: qty 30, 33d supply, fill #0

## 2023-04-29 MED ORDER — DESVENLAFAXINE SUCCINATE ER 100 MG PO TB24
100.0000 mg | ORAL_TABLET | Freq: Every day | ORAL | 1 refills | Status: DC
  Filled 2023-04-29 – 2023-06-11 (×3): qty 90, 90d supply, fill #0

## 2023-04-29 MED ORDER — HYDROXYZINE HCL 25 MG PO TABS
25.0000 mg | ORAL_TABLET | Freq: Four times a day (QID) | ORAL | 1 refills | Status: DC | PRN
  Filled 2023-04-29: qty 360, 90d supply, fill #0
  Filled ????-??-??: fill #0

## 2023-04-30 ENCOUNTER — Other Ambulatory Visit (HOSPITAL_BASED_OUTPATIENT_CLINIC_OR_DEPARTMENT_OTHER): Payer: Self-pay

## 2023-04-30 MED ORDER — HYDROXYZINE HCL 50 MG PO TABS
ORAL_TABLET | ORAL | 0 refills | Status: DC
  Filled 2023-04-30: qty 225, 90d supply, fill #0

## 2023-05-01 ENCOUNTER — Other Ambulatory Visit: Payer: Self-pay

## 2023-05-01 ENCOUNTER — Encounter: Payer: Self-pay | Admitting: Physical Medicine & Rehabilitation

## 2023-05-01 ENCOUNTER — Other Ambulatory Visit (HOSPITAL_BASED_OUTPATIENT_CLINIC_OR_DEPARTMENT_OTHER): Payer: Self-pay

## 2023-05-01 MED ORDER — TENAPANOR HCL 50 MG PO TABS
50.0000 mg | ORAL_TABLET | Freq: Two times a day (BID) | ORAL | 1 refills | Status: DC
  Filled 2023-05-01: qty 60, 30d supply, fill #0
  Filled 2023-05-27: qty 60, 30d supply, fill #1
  Filled 2023-06-26: qty 60, 30d supply, fill #2
  Filled 2023-07-21: qty 180, 90d supply, fill #3

## 2023-05-01 MED ORDER — PANCRELIPASE (LIP-PROT-AMYL) 24000-76000 UNITS PO CPEP
ORAL_CAPSULE | ORAL | 5 refills | Status: DC
  Filled 2023-05-01: qty 200, 33d supply, fill #0
  Filled 2023-07-21: qty 200, 33d supply, fill #1
  Filled 2023-08-17: qty 200, 33d supply, fill #2

## 2023-05-01 MED ORDER — HYDROXYZINE HCL 25 MG PO TABS
ORAL_TABLET | ORAL | 2 refills | Status: DC
  Filled 2023-05-01: qty 150, 30d supply, fill #0
  Filled 2023-05-27 – 2023-05-29 (×2): qty 150, 30d supply, fill #1
  Filled 2023-06-26: qty 150, 30d supply, fill #2

## 2023-05-02 ENCOUNTER — Other Ambulatory Visit (HOSPITAL_BASED_OUTPATIENT_CLINIC_OR_DEPARTMENT_OTHER): Payer: Self-pay

## 2023-05-04 ENCOUNTER — Other Ambulatory Visit: Payer: Self-pay

## 2023-05-04 DIAGNOSIS — D751 Secondary polycythemia: Secondary | ICD-10-CM | POA: Diagnosis not present

## 2023-05-06 DIAGNOSIS — F331 Major depressive disorder, recurrent, moderate: Secondary | ICD-10-CM | POA: Diagnosis not present

## 2023-05-08 ENCOUNTER — Other Ambulatory Visit (HOSPITAL_BASED_OUTPATIENT_CLINIC_OR_DEPARTMENT_OTHER): Payer: Self-pay

## 2023-05-08 ENCOUNTER — Other Ambulatory Visit: Payer: Self-pay | Admitting: Family Medicine

## 2023-05-08 ENCOUNTER — Other Ambulatory Visit: Payer: Self-pay

## 2023-05-08 DIAGNOSIS — E669 Obesity, unspecified: Secondary | ICD-10-CM

## 2023-05-11 ENCOUNTER — Other Ambulatory Visit (HOSPITAL_BASED_OUTPATIENT_CLINIC_OR_DEPARTMENT_OTHER): Payer: Self-pay

## 2023-05-11 MED ORDER — PHENTERMINE HCL 37.5 MG PO TABS
37.5000 mg | ORAL_TABLET | Freq: Every day | ORAL | 0 refills | Status: DC
  Filled 2023-05-11 – 2023-05-13 (×2): qty 30, 30d supply, fill #0

## 2023-05-13 ENCOUNTER — Other Ambulatory Visit (HOSPITAL_COMMUNITY): Payer: Self-pay

## 2023-05-13 ENCOUNTER — Other Ambulatory Visit (HOSPITAL_BASED_OUTPATIENT_CLINIC_OR_DEPARTMENT_OTHER): Payer: Self-pay

## 2023-05-13 ENCOUNTER — Encounter: Payer: Self-pay | Admitting: Family Medicine

## 2023-05-13 ENCOUNTER — Other Ambulatory Visit: Payer: Self-pay

## 2023-05-13 DIAGNOSIS — F331 Major depressive disorder, recurrent, moderate: Secondary | ICD-10-CM | POA: Diagnosis not present

## 2023-05-13 MED ORDER — FLUOCINOLONE ACETONIDE 0.01 % OT OIL
4.0000 [drp] | TOPICAL_OIL | Freq: Every day | OTIC | 1 refills | Status: DC
Start: 2023-05-13 — End: 2023-06-08
  Filled 2023-05-13: qty 20, 30d supply, fill #0

## 2023-05-14 ENCOUNTER — Other Ambulatory Visit: Payer: Self-pay

## 2023-05-14 ENCOUNTER — Other Ambulatory Visit (HOSPITAL_COMMUNITY): Payer: Self-pay

## 2023-05-14 ENCOUNTER — Other Ambulatory Visit (HOSPITAL_BASED_OUTPATIENT_CLINIC_OR_DEPARTMENT_OTHER): Payer: Self-pay

## 2023-05-14 DIAGNOSIS — Z79899 Other long term (current) drug therapy: Secondary | ICD-10-CM | POA: Diagnosis not present

## 2023-05-14 DIAGNOSIS — L400 Psoriasis vulgaris: Secondary | ICD-10-CM | POA: Diagnosis not present

## 2023-05-14 MED ORDER — VILAZODONE HCL 20 MG PO TABS
20.0000 mg | ORAL_TABLET | Freq: Every day | ORAL | 1 refills | Status: DC
  Filled 2023-05-14: qty 30, 30d supply, fill #0
  Filled 2023-06-09: qty 30, 30d supply, fill #1

## 2023-05-14 MED ORDER — ONDANSETRON 4 MG PO TBDP
4.0000 mg | ORAL_TABLET | Freq: Three times a day (TID) | ORAL | 0 refills | Status: DC | PRN
  Filled 2023-05-14: qty 90, 30d supply, fill #0

## 2023-05-14 NOTE — Progress Notes (Signed)
 Specialty Pharmacy Refill Coordination Note  Jesse Bowers is a 58 y.o. adult contacted today regarding refills of specialty medication(s) No data recorded  Patient requested (Patient-Rptd) Pickup at [facility] date: (Patient-Rptd) 05/15/23   Medication will be filled on 05/15/23.

## 2023-05-15 ENCOUNTER — Other Ambulatory Visit (HOSPITAL_COMMUNITY): Payer: Self-pay

## 2023-05-15 ENCOUNTER — Other Ambulatory Visit: Payer: Self-pay

## 2023-05-18 ENCOUNTER — Other Ambulatory Visit (HOSPITAL_BASED_OUTPATIENT_CLINIC_OR_DEPARTMENT_OTHER): Payer: Self-pay

## 2023-05-19 DIAGNOSIS — K8689 Other specified diseases of pancreas: Secondary | ICD-10-CM | POA: Diagnosis not present

## 2023-05-19 DIAGNOSIS — K219 Gastro-esophageal reflux disease without esophagitis: Secondary | ICD-10-CM | POA: Diagnosis not present

## 2023-05-19 DIAGNOSIS — R112 Nausea with vomiting, unspecified: Secondary | ICD-10-CM | POA: Diagnosis not present

## 2023-05-19 DIAGNOSIS — E039 Hypothyroidism, unspecified: Secondary | ICD-10-CM | POA: Diagnosis not present

## 2023-05-19 DIAGNOSIS — Z7984 Long term (current) use of oral hypoglycemic drugs: Secondary | ICD-10-CM | POA: Diagnosis not present

## 2023-05-19 DIAGNOSIS — Z86718 Personal history of other venous thrombosis and embolism: Secondary | ICD-10-CM | POA: Diagnosis not present

## 2023-05-19 DIAGNOSIS — K900 Celiac disease: Secondary | ICD-10-CM | POA: Diagnosis not present

## 2023-05-19 DIAGNOSIS — Z7901 Long term (current) use of anticoagulants: Secondary | ICD-10-CM | POA: Diagnosis not present

## 2023-05-19 DIAGNOSIS — Z8673 Personal history of transient ischemic attack (TIA), and cerebral infarction without residual deficits: Secondary | ICD-10-CM | POA: Diagnosis not present

## 2023-05-19 DIAGNOSIS — Z79899 Other long term (current) drug therapy: Secondary | ICD-10-CM | POA: Diagnosis not present

## 2023-05-20 ENCOUNTER — Other Ambulatory Visit (HOSPITAL_BASED_OUTPATIENT_CLINIC_OR_DEPARTMENT_OTHER): Payer: Self-pay

## 2023-05-20 DIAGNOSIS — F331 Major depressive disorder, recurrent, moderate: Secondary | ICD-10-CM | POA: Diagnosis not present

## 2023-05-20 MED ORDER — RABEPRAZOLE SODIUM 20 MG PO TBEC
DELAYED_RELEASE_TABLET | ORAL | 1 refills | Status: DC
Start: 2023-05-20 — End: 2023-11-13
  Filled 2023-05-20: qty 180, 90d supply, fill #0
  Filled 2023-08-17: qty 180, 90d supply, fill #1

## 2023-05-25 ENCOUNTER — Other Ambulatory Visit (HOSPITAL_BASED_OUTPATIENT_CLINIC_OR_DEPARTMENT_OTHER): Payer: Self-pay

## 2023-05-25 DIAGNOSIS — H6123 Impacted cerumen, bilateral: Secondary | ICD-10-CM | POA: Diagnosis not present

## 2023-05-25 DIAGNOSIS — H608X3 Other otitis externa, bilateral: Secondary | ICD-10-CM | POA: Diagnosis not present

## 2023-05-25 DIAGNOSIS — H903 Sensorineural hearing loss, bilateral: Secondary | ICD-10-CM | POA: Diagnosis not present

## 2023-05-26 ENCOUNTER — Other Ambulatory Visit: Payer: Self-pay

## 2023-05-26 ENCOUNTER — Other Ambulatory Visit (HOSPITAL_BASED_OUTPATIENT_CLINIC_OR_DEPARTMENT_OTHER): Payer: Self-pay

## 2023-05-27 ENCOUNTER — Other Ambulatory Visit (HOSPITAL_BASED_OUTPATIENT_CLINIC_OR_DEPARTMENT_OTHER): Payer: Self-pay

## 2023-05-27 DIAGNOSIS — F331 Major depressive disorder, recurrent, moderate: Secondary | ICD-10-CM | POA: Diagnosis not present

## 2023-05-28 ENCOUNTER — Encounter: Payer: Self-pay | Admitting: Family Medicine

## 2023-05-28 ENCOUNTER — Other Ambulatory Visit (HOSPITAL_BASED_OUTPATIENT_CLINIC_OR_DEPARTMENT_OTHER): Payer: Self-pay

## 2023-05-28 ENCOUNTER — Other Ambulatory Visit: Payer: Self-pay

## 2023-05-28 DIAGNOSIS — M79642 Pain in left hand: Secondary | ICD-10-CM | POA: Diagnosis not present

## 2023-05-28 DIAGNOSIS — Z86718 Personal history of other venous thrombosis and embolism: Secondary | ICD-10-CM | POA: Diagnosis not present

## 2023-05-28 DIAGNOSIS — L409 Psoriasis, unspecified: Secondary | ICD-10-CM | POA: Diagnosis not present

## 2023-05-28 DIAGNOSIS — Z7901 Long term (current) use of anticoagulants: Secondary | ICD-10-CM | POA: Diagnosis not present

## 2023-05-28 DIAGNOSIS — Z79899 Other long term (current) drug therapy: Secondary | ICD-10-CM | POA: Diagnosis not present

## 2023-05-28 DIAGNOSIS — L4050 Arthropathic psoriasis, unspecified: Secondary | ICD-10-CM | POA: Diagnosis not present

## 2023-05-28 DIAGNOSIS — R748 Abnormal levels of other serum enzymes: Secondary | ICD-10-CM | POA: Diagnosis not present

## 2023-05-28 DIAGNOSIS — L649 Androgenic alopecia, unspecified: Secondary | ICD-10-CM

## 2023-05-28 DIAGNOSIS — M549 Dorsalgia, unspecified: Secondary | ICD-10-CM | POA: Diagnosis not present

## 2023-05-28 DIAGNOSIS — Z79631 Long term (current) use of antimetabolite agent: Secondary | ICD-10-CM | POA: Diagnosis not present

## 2023-05-28 DIAGNOSIS — Z79620 Long term (current) use of immunosuppressive biologic: Secondary | ICD-10-CM | POA: Diagnosis not present

## 2023-05-28 DIAGNOSIS — D6859 Other primary thrombophilia: Secondary | ICD-10-CM | POA: Diagnosis not present

## 2023-05-28 DIAGNOSIS — K900 Celiac disease: Secondary | ICD-10-CM | POA: Diagnosis not present

## 2023-05-28 DIAGNOSIS — M79641 Pain in right hand: Secondary | ICD-10-CM | POA: Diagnosis not present

## 2023-05-28 MED ORDER — PREDNISONE 5 MG PO TABS
ORAL_TABLET | ORAL | 0 refills | Status: AC
  Filled 2023-05-28: qty 50, 20d supply, fill #0

## 2023-05-28 MED ORDER — METHOTREXATE SODIUM CHEMO INJECTION 50 MG/2ML
20.0000 mg | INTRAMUSCULAR | 0 refills | Status: DC
  Filled 2023-05-28: qty 10, 84d supply, fill #0

## 2023-05-28 MED ORDER — TUBERCULIN SYRINGE 27G X 1/2" 1 ML MISC
3 refills | Status: DC
Start: 2023-05-28 — End: 2023-10-30
  Filled 2023-05-28 (×3): qty 25, 25d supply, fill #0
  Filled 2023-06-26: qty 25, 25d supply, fill #1
  Filled 2023-07-21: qty 25, 25d supply, fill #2
  Filled 2023-08-12: qty 25, 25d supply, fill #3

## 2023-05-28 MED ORDER — FOLIC ACID 1 MG PO TABS
1.0000 mg | ORAL_TABLET | Freq: Every day | ORAL | 3 refills | Status: AC
  Filled 2023-05-28: qty 90, 90d supply, fill #0
  Filled 2023-08-31: qty 90, 90d supply, fill #1
  Filled 2023-11-30: qty 90, 90d supply, fill #2
  Filled 2024-02-27: qty 90, 90d supply, fill #3

## 2023-05-29 ENCOUNTER — Other Ambulatory Visit (HOSPITAL_BASED_OUTPATIENT_CLINIC_OR_DEPARTMENT_OTHER): Payer: Self-pay

## 2023-05-29 ENCOUNTER — Other Ambulatory Visit: Payer: Self-pay

## 2023-05-29 MED ORDER — FINASTERIDE 1 MG PO TABS
1.0000 mg | ORAL_TABLET | Freq: Every day | ORAL | 1 refills | Status: DC
  Filled 2023-05-29: qty 90, 90d supply, fill #0
  Filled 2023-08-24: qty 90, 90d supply, fill #1

## 2023-06-01 ENCOUNTER — Other Ambulatory Visit: Payer: Self-pay | Admitting: Physical Medicine & Rehabilitation

## 2023-06-01 ENCOUNTER — Other Ambulatory Visit: Payer: Self-pay

## 2023-06-01 ENCOUNTER — Other Ambulatory Visit (HOSPITAL_COMMUNITY): Payer: Self-pay

## 2023-06-01 ENCOUNTER — Other Ambulatory Visit (HOSPITAL_BASED_OUTPATIENT_CLINIC_OR_DEPARTMENT_OTHER): Payer: Self-pay

## 2023-06-01 DIAGNOSIS — M47816 Spondylosis without myelopathy or radiculopathy, lumbar region: Secondary | ICD-10-CM

## 2023-06-01 MED ORDER — TREMFYA 100 MG/ML ~~LOC~~ SOAJ: SUBCUTANEOUS | 5 refills | Status: DC

## 2023-06-01 MED ORDER — TREMFYA 100 MG/ML ~~LOC~~ SOAJ: SUBCUTANEOUS | 0 refills | Status: DC

## 2023-06-01 MED ORDER — TREMFYA 100 MG/ML ~~LOC~~ SOAJ
SUBCUTANEOUS | 5 refills | Status: DC
Start: 2023-06-01 — End: 2023-06-03

## 2023-06-01 MED ORDER — TREMFYA 100 MG/ML ~~LOC~~ SOAJ
SUBCUTANEOUS | 0 refills | Status: DC
Start: 2023-06-01 — End: 2023-06-01

## 2023-06-02 ENCOUNTER — Other Ambulatory Visit (HOSPITAL_BASED_OUTPATIENT_CLINIC_OR_DEPARTMENT_OTHER): Payer: Self-pay

## 2023-06-02 DIAGNOSIS — F331 Major depressive disorder, recurrent, moderate: Secondary | ICD-10-CM | POA: Diagnosis not present

## 2023-06-02 MED ORDER — MORPHINE SULFATE ER 30 MG PO TBCR
30.0000 mg | EXTENDED_RELEASE_TABLET | Freq: Two times a day (BID) | ORAL | 0 refills | Status: DC
  Filled 2023-06-02: qty 60, 30d supply, fill #0

## 2023-06-03 ENCOUNTER — Encounter: Payer: Self-pay | Admitting: Pharmacist

## 2023-06-03 ENCOUNTER — Other Ambulatory Visit: Payer: Self-pay

## 2023-06-03 ENCOUNTER — Other Ambulatory Visit: Payer: Self-pay | Admitting: Pharmacist

## 2023-06-03 ENCOUNTER — Ambulatory Visit: Attending: Family Medicine | Admitting: Pharmacist

## 2023-06-03 DIAGNOSIS — Z79899 Other long term (current) drug therapy: Secondary | ICD-10-CM

## 2023-06-03 MED ORDER — TREMFYA 100 MG/ML ~~LOC~~ SOAJ
SUBCUTANEOUS | 0 refills | Status: DC
  Filled 2023-06-03: qty 2, fill #0
  Filled 2023-06-09: qty 1, 28d supply, fill #0

## 2023-06-03 MED ORDER — TREMFYA 100 MG/ML ~~LOC~~ SOAJ
SUBCUTANEOUS | 5 refills | Status: DC
  Filled 2023-06-03: qty 1, fill #0
  Filled 2023-06-09 – 2023-07-13 (×2): qty 1, 56d supply, fill #0

## 2023-06-03 NOTE — Progress Notes (Signed)
   S: Patient presents today for review of their specialty medication.   Patient is about to start taking Tremfya for psoriasis. Patient is managed by Dr. Letta Pate for this.   Dosing: Plaque psoriasis: SubQ: 100 mg at weeks 0, 4, and then every 8 weeks thereafter.  Adherence: has not yet started   Efficacy: has not yet started   Monitoring:  S/sx of infection: none  S/sx of hypersensitivity: none   Current adverse effects: none   O:     Lab Results  Component Value Date   WBC 3.8 (L) 04/14/2023   HGB 14.9 04/14/2023   HCT 46.1 04/14/2023   MCV 82.0 04/14/2023   PLT 203.0 04/14/2023      Chemistry      Component Value Date/Time   NA 140 11/05/2021 1805   NA 138 02/18/2021 1427   NA 138 01/07/2017 1243   K 4.5 11/05/2021 1805   K 4.8 01/07/2017 1243   CL 100 11/05/2021 1805   CO2 28 11/05/2021 1805   CO2 25 01/07/2017 1243   BUN 18 11/05/2021 1805   BUN 11 02/18/2021 1427   BUN 18.2 01/07/2017 1243   CREATININE 1.04 11/05/2021 1805   CREATININE 1.1 01/07/2017 1243      Component Value Date/Time   CALCIUM 9.3 11/05/2021 1805   CALCIUM 9.2 01/07/2017 1243   ALKPHOS 187 (H) 11/05/2021 1805   ALKPHOS 453 (H) 01/07/2017 1243   AST 49 (H) 11/05/2021 1805   AST 57 (H) 01/07/2017 1243   ALT 36 11/05/2021 1805   ALT 78 (H) 01/07/2017 1243   BILITOT 0.7 11/05/2021 1805   BILITOT 0.4 04/29/2018 1716   BILITOT 0.58 01/07/2017 1243       A/P: 1. Medication review: patient currently on Tremfya for plaque psoriasis. Reviewed the medication with the patient, including the following: Tremfya is a medication used in the treatment of plaque psoriasis. Administer SubQ into front of thighs, lower abdomen (except for 2 inches around navel), or back of upper arms; do not inject into areas where the skin is tender, bruised, red, hard, thick, scaly, or affected by psoriasis. Possible adverse reactions include increased risk of infection, headache, and hypersensitivity reactions.  Live vaccinations should be avoided. No recommendations for any changes.  Butch Penny, PharmD, Patsy [place], CPP Clinical Pharmacist [facility] [phone]

## 2023-06-03 NOTE — Progress Notes (Addendum)
 Patient is changing to Tremfya.  Pharmacy Patient Advocate Encounter  Insurance verification completed.   The patient is insured through [facility]   Ran test claim for Tremfya. Co-pay is $0. Patient has copay card.   This test claim was processed through [facility]- copay amounts may vary at other pharmacies due to pharmacy/plan contracts, or as the patient moves through the different stages of their insurance plan.

## 2023-06-03 NOTE — Progress Notes (Signed)
 See OV from 06/03/2023 for complete documentation.   Butch Penny, PharmD, Patsy [place], CPP Clinical Pharmacist [facility] [phone]

## 2023-06-04 ENCOUNTER — Other Ambulatory Visit: Payer: Self-pay

## 2023-06-04 ENCOUNTER — Other Ambulatory Visit (HOSPITAL_COMMUNITY): Payer: Self-pay

## 2023-06-08 DIAGNOSIS — D751 Secondary polycythemia: Secondary | ICD-10-CM | POA: Diagnosis not present

## 2023-06-08 DIAGNOSIS — F3341 Major depressive disorder, recurrent, in partial remission: Secondary | ICD-10-CM | POA: Diagnosis not present

## 2023-06-09 ENCOUNTER — Other Ambulatory Visit (HOSPITAL_COMMUNITY): Payer: Self-pay

## 2023-06-09 ENCOUNTER — Other Ambulatory Visit: Payer: Self-pay

## 2023-06-09 MED ORDER — ONDANSETRON HCL 8 MG PO TABS
8.0000 mg | ORAL_TABLET | Freq: Three times a day (TID) | ORAL | 2 refills | Status: AC | PRN
  Filled 2023-06-09: qty 90, 30d supply, fill #0
  Filled 2023-11-12: qty 90, 30d supply, fill #1
  Filled 2024-01-09 – 2024-01-18 (×2): qty 90, 30d supply, fill #2

## 2023-06-09 NOTE — Progress Notes (Signed)
 Specialty Pharmacy Initial Fill Coordination Note  Jesse Bowers is a 58 y.o. adult contacted today regarding initial fill of specialty medication(s) [name])   Patient requested [name] at [facility] at [place] date: 06/11/23   Medication will be filled on 4/9.   Patient is aware of $0 copayment.

## 2023-06-10 ENCOUNTER — Encounter: Payer: Self-pay | Admitting: Physical Medicine & Rehabilitation

## 2023-06-10 ENCOUNTER — Encounter
Payer: Commercial Managed Care - PPO | Attending: Physical Medicine & Rehabilitation | Admitting: Physical Medicine & Rehabilitation

## 2023-06-10 ENCOUNTER — Other Ambulatory Visit (HOSPITAL_BASED_OUTPATIENT_CLINIC_OR_DEPARTMENT_OTHER): Payer: Self-pay

## 2023-06-10 DIAGNOSIS — L4050 Arthropathic psoriasis, unspecified: Secondary | ICD-10-CM | POA: Insufficient documentation

## 2023-06-10 DIAGNOSIS — M47816 Spondylosis without myelopathy or radiculopathy, lumbar region: Secondary | ICD-10-CM | POA: Insufficient documentation

## 2023-06-10 DIAGNOSIS — F331 Major depressive disorder, recurrent, moderate: Secondary | ICD-10-CM | POA: Diagnosis not present

## 2023-06-10 MED ORDER — OXYCODONE HCL 10 MG PO TABS
10.0000 mg | ORAL_TABLET | Freq: Three times a day (TID) | ORAL | 0 refills | Status: DC | PRN
  Filled 2023-06-10: qty 90, 30d supply, fill #0

## 2023-06-10 NOTE — Patient Instructions (Signed)
 ALWAYS FEEL FREE TO CALL OUR OFFICE WITH ANY PROBLEMS OR QUESTIONS [phone])  **PLEASE NOTE** ALL MEDICATION REFILL REQUESTS (INCLUDING CONTROLLED SUBSTANCES) NEED TO BE MADE AT LEAST 7 DAYS PRIOR TO REFILL BEING DUE. ANY REFILL REQUESTS INSIDE THAT TIME FRAME MAY RESULT IN DELAYS IN RECEIVING YOUR PRESCRIPTION.

## 2023-06-10 NOTE — Progress Notes (Signed)
 Subjective:    Patient ID: Jesse Bowers, male   DOB: 1965/12/11, 58 y.o.   MRN: [id number]  HPI Buel is here in follow-up of his chronic pain and gait issues.  I last saw him in February. He is changing from Cosentyx to trefmfya for his PA. He is also changing to injectable methotrexate to help with his increased joint pain. He also had a w/u for AS    He remains on MS Contin 30 mg every 12 hours and oxycodone 10 mg once or twice daily. He is changing from Cosentyx to trefmfya for his PA. He is also changing to injectable methotrexate to help with his increased joint pain. He also had a w/u for AS. He is having more low back pain. He had good results with ESI but these effects seem to have waned. He does like a hot shower or heating pad. His meds take the edge off his lobe back pain. Stretching helps as well.  He also likes to use his TENS unit.  He had an EGD with [facility] which demonstrated irritation in his upper intestine c/w celiac disease.  He is awaiting further treatment options.    Pain Inventory Average Pain 7 Pain Right Now 8 My pain is intermittent  LOCATION OF PAIN   EVERYWHERE  BOWEL Number of stools per week: multiple     Mobility use a cane      Family History  Problem Relation Age of Onset   Stroke Maternal Grandfather        81   Heart attack Maternal Grandfather    Glaucoma Maternal Grandfather    Macular degeneration Maternal Grandfather    Breast cancer Sister    Hypertension Mother    Psoriasis Mother    Other Mother        meningioma developed ~2019   Glaucoma Mother    Cancer Paternal Grandfather    Heart attack Paternal Grandfather    Stroke Paternal Uncle        age 73   Polycythemia Paternal Uncle    Stroke Maternal Grandmother    Congestive Heart Failure Maternal Grandmother    Heart attack Maternal Grandmother    Protein C deficiency Sister 66       Miscarriages   Breast cancer Maternal Aunt 35   Social History    Socioeconomic History   Marital status: Married    Spouse name: Not on file   Number of children: 2   Years of education: 4y college   Highest education level: Master's degree (e.g., MA, MS, MEng, MEd, MSW, MBA)  Occupational History   Occupation: Restaurant manager, fast food    Comment: Not working since CVA 2015  Tobacco Use   Smoking status: Never   Smokeless tobacco: Never  Vaping Use   Vaping status: Never Used  Substance and Sexual Activity   Alcohol use: Yes    Comment: social   Drug use: No   Sexual activity: Yes    Birth control/protection: None    Comment: patient is a transgender on testosterone shots, no biological kids  Other Topics Concern   Not on file  Social History Narrative   Not on file   Social Drivers of Health   Financial Resource Strain: Low Risk  (03/12/2023)   Overall Financial Resource Strain (CARDIA)    Difficulty of Paying Living Expenses: Not very hard  Recent Concern: Financial Resource Strain - High Risk (12/12/2022)   Overall Financial Resource Strain (CARDIA)  Difficulty of Paying Living Expenses: Hard  Food Insecurity: Low Risk  (05/25/2023)   Received from [facility]   Hunger Vital Sign    Worried About Running Out of Food in the Last Year: Never true    Ran Out of Food in the Last Year: Never true  Transportation Needs: No Transportation Needs (05/25/2023)   Received from [facility]    In the past 12 months, has lack of reliable transportation kept you from medical appointments, meetings, work or from getting things needed for daily living? : No  Recent Concern: Transportation Needs - Unmet Transportation Needs (03/12/2023)   PRAPARE - Transportation    Lack of Transportation (Medical): Yes    Lack of Transportation (Non-Medical): Yes  Physical Activity: Unknown (03/12/2023)   Exercise Vital Sign    Days of Exercise per Week: 0 days    Minutes of Exercise per Session: Not on file  Stress: Stress Concern  Present (03/12/2023)   [employer] of Occupational Health - Occupational Stress Questionnaire    Feeling of Stress : Rather much  Social Connections: Socially Isolated (03/12/2023)   Social Connection and Isolation Panel [NHANES]    Frequency of Communication with Friends and Family: Never    Frequency of Social Gatherings with Friends and Family: Never    Attends Religious Services: Never    Database administrator or Organizations: No    Attends Engineer, structural: Not on file    Marital Status: Married   Past Surgical History:  Procedure Laterality Date   ABDOMINAL HYSTERECTOMY Bilateral 1994   TAH, BSO- tranverse incision at 58 yo   ANKLE ARTHROSCOPY WITH RECONSTRUCTION Right 2007   CHOLECYSTECTOMY     laparoscopic   COLONOSCOPY     x3   EYE SURGERY     Left eye 03/02/2018, right 02/15/2018   HEMORROIDECTOMY  08/19/2020   HIP ARTHROSCOPY W/ LABRAL REPAIR Right 05/11/2013   acetabular labral tear 03/30/2013   KNEE ARTHROPLASTY Right    KNEE JOINT MANIPULATION Left    x3 under anesthesia   KNEE SURGERY Bilateral 1984   Right ACL, left PCL repair   LITHOTRIPSY  2005   LIVER BIOPSY  2013   normal results.   MASTECTOMY Bilateral    prior to 2009   MOUTH SURGERY     NASAL SEPTUM SURGERY N/A 09/20/2015   by ENT Dr. Ezzard Standing   OVARIAN CYST SURGERY Left    size of grapefruit, was informed that she had shortened vagina   SHOULDER SURGERY Bilateral    Right 08/15/2016, Left 11/15/2016   THUMB ARTHROSCOPY Left    THYROIDECTOMY, PARTIAL Left 2008   TOTAL KNEE ARTHROPLASTY Right 08/23/2018   Procedure: TOTAL KNEE ARTHROPLASTY;  Surgeon: Ollen Gross, MD;  Location: [facility];  Service: Orthopedics;  Laterality: Right;    TOTAL KNEE REVISION Left 02/06/2016   Procedure: LEFT TOTAL KNEE REVISION;  Surgeon: Ollen Gross, MD;  Location: [facility];  Service: Orthopedics;  Laterality: Left;   TOTAL KNEE REVISION Left 04/22/2017   Procedure: Left knee polyethylene revision;   Surgeon: Ollen Gross, MD;  Location: [facility];  Service: Orthopedics;  Laterality: Left;   UPPER GI ENDOSCOPY  2003   Past Medical History:  Diagnosis Date   Abnormal weight loss    Anxiety    Arthritis    Cataract    OU   Celiac disease    Cervical neck pain with evidence of disc disease  patient has a cyst    Chronic constipation    Chronic diastolic heart failure (HCC)    Pt. 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   Family history of adverse reaction to anesthesia    family has problems with anesthesia of nausea and vomiting    GERD (gastroesophageal reflux disease)    History of in 20's   Gluten enteropathy    H/O parotitis    right    Hard of hearing    History of kidney stones    History of retinal tear    Bilateral   History of staph infection    required wound vac   Hx-TIA (transient ischemic attack)    2015   Kidney stones 08/2020   LVH (left ventricular hypertrophy) 12/15/2016   Mild, noted on ECHO   MVP (mitral valve prolapse)    NAFL (nonalcoholic fatty liver)    Neck pain    Neuromuscular disorder (HCC)    bilateral neuropathy feet.   Nuclear sclerotic cataract of both eyes 09/08/2019   Pneumonia 12/17/2010   Polycythemia    Polycythemia, secondary    PONV (postoperative nausea and vomiting)    Protein C deficiency (HCC)    Dr. Thomes Cake   Psoriasis    16 X10 cm psoriatic rash on sole of left foot ; open and occ scant bleeding;    psoriatic arthritis    PTSD (post-traumatic stress disorder)    Scaphoid fracture of wrist 09/23/2013   Seizure (HCC)    childhood, medication until age 31 then weanned completely off   Sleep apnea    split night study last done by Dr. Epimenio Foot 06/18/15 shows severe OSA, CSA, and hypersomnia, rec  bipap   Splenomegaly    Stenosis of ureteropelvic junction (UPJ)    left   Stroke [facility])    CVA vs TIA in left cerebrum causing slight right sided weakness-Dr. 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    Wears glasses    Wears hearing aid    There were no vitals taken for this visit.  Opioid Risk Score:   Fall Risk Score:  `1  Depression screen [facility] 2/9     04/14/2023    9:23 AM 01/16/2023    2:31 PM 12/16/2022   12:59 PM 12/10/2022   10:48 AM 08/06/2022    3:14 PM 07/07/2022    1:32 PM 06/12/2022    2:24 PM  Depression screen PHQ 2/9  Decreased Interest 1 1 1  0 0 1 0  Down, Depressed, Hopeless 1 2 1  0 0 1 0  PHQ - 2 Score 2 3 2  0 0 2 0  Altered sleeping 3 3 1   1 2   Tired, decreased energy 1 1 1     0  Change in appetite 0 0 1   0 0  Feeling bad or failure about yourself  0 3 0   0 0  Trouble concentrating 1 3  1   2 2   Moving slowly or fidgety/restless 0 0 0   0 0  Suicidal thoughts 0 0 0   0 0  PHQ-9 Score 7 13 6   5 4   Difficult doing work/chores Somewhat difficult Very difficult Somewhat difficult   Somewhat difficult     Review of Systems     Objective:   Physical Exam  General: No acute distress HEENT: NCAT, EOMI, oral membranes moist Cards: reg rate  Chest: normal effort Abdomen: Soft, NT, ND Skin: dry, intact Extremities: no edema Psych: pleasant and appropriate  Skin: intact Neuro: Alert and oriented x 3. Normal insight and awareness. Intact Memory. Normal language and speech. Cranial nerve exam unremarkable. LE motor 5/5 LLE and 4 to 4+ out of 5 in the right lower extremity with perhaps a bit more weakness still in the right ankle.  He did generally seem stronger today than he has in prior motor testing exams.  He has stocking glove sensory loss in both feet  below ankles as well as sensory loss along left lateral leg. Right 2-4 toes remain more affected.  [name]  He continues with his cane for balance and to help offload his left leg to a certain extent. Musculoskeletal: Left knee appears less tender and his range of motion seems improved from last visit as he is able to extend almost fully and was flexing to 90 to 100 degrees.           Assessment & Plan:  1. Psoriatic arthritis with pain in multiple areas, most prominently feet, hands, elbows. Pain also related to his prior CVA and associated motor/sensory change   2. 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 3. Hx of left total knee revision again on 04/21/17 per [facility] and right TKA 08/2018 thru [facility].              -popliteal swelling on left, baker's cyst?  -knee seems to have cooled down.  Range of motion is improved 4. Chronic  low back pain---MRI with severe DDD at L5-S1. Left S1 radiculopathy? only mild foraminal stenosis however -S/P left translaminar L5-S1 ESI by Dr. Wynn Banker.   -improved pain control -continue HEP -nexwave --TENS for back pain   -He will need to continue a multimodal approach to manage his low back pain.  We discussed good technique with his ambulation as well. 5. Protein C deficiency   6. Left shoulder subluxation, bicipital tendonitis 7. Central sleep apnea 8. Chronic opioid use for pain             -continue ms contin 30mg  q12 hours. Today RF #60 for end of the month.             -continue oxycodone 10mg  q12 prn with goal of every day prn he has been using this fairly sparingly.  If he needs to take it more frequently postoperatively next week that is fine.             -We will continue the controlled substance monitoring program, this consists of regular clinic visits, examinations, routine drug screening, pill  counts as well as use of [place] Controlled Substance Reporting System. [place] today.   9. Depression with anxiety.  0. Left lateral epidondylitis  11. 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  has had right and left sided weakness with sensory loss in C4-6 dermatomes.[name]  Most recent cervical MRI without significant change from 2019 study                            -neuro exam stable to improved 06/10/23. 12. 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 OINC 14. Right wrist psoriatic arthritis, tendonapthy, ganglion cysts s/p multiple recent surgeries.  s/p right thumb replacement             -is followed by Dr. Orlan Leavens at [facility]--             -arthroscopic surgery right wrist complete 15. Recent gender reconstruction surgery--seems to be progressing well.    20 minutes of face to face patient care time were spent during this visit. All questions were encouraged and answered. Follow up with NP in about 2 months.

## 2023-06-11 ENCOUNTER — Other Ambulatory Visit (HOSPITAL_COMMUNITY): Payer: Self-pay

## 2023-06-15 DIAGNOSIS — H04123 Dry eye syndrome of bilateral lacrimal glands: Secondary | ICD-10-CM | POA: Diagnosis not present

## 2023-06-15 DIAGNOSIS — H35413 Lattice degeneration of retina, bilateral: Secondary | ICD-10-CM | POA: Diagnosis not present

## 2023-06-15 DIAGNOSIS — H538 Other visual disturbances: Secondary | ICD-10-CM | POA: Diagnosis not present

## 2023-06-17 DIAGNOSIS — F331 Major depressive disorder, recurrent, moderate: Secondary | ICD-10-CM | POA: Diagnosis not present

## 2023-06-19 ENCOUNTER — Other Ambulatory Visit: Payer: Self-pay

## 2023-06-19 ENCOUNTER — Encounter: Payer: Self-pay | Admitting: Family Medicine

## 2023-06-19 ENCOUNTER — Ambulatory Visit: Payer: Self-pay | Admitting: Family Medicine

## 2023-06-19 NOTE — Telephone Encounter (Signed)
 Summary: Vomiting/Diarrhea   Copied From CRM [id number]. Reason for Triage: Patient tested positive for COVID and is requesting Paxlovid to be sent to [facility]. Symptoms started Tuesday night *cough, runny nose, sore throat, headache, vomiting, diarrhea         1st attempt- no answer, phone number not in service

## 2023-06-19 NOTE — Telephone Encounter (Signed)
 Chief Complaint: SOB w/ COVID Symptoms: SOB, diarrhea, chest tightness, fever, cough Frequency: symptoms onset Wednesday night Pertinent Negatives: Patient denies CP or pressure, SOB at rest Disposition: [x] ED /[] Urgent Care (no appt availability in office) / [] Appointment(In office/virtual)/ []  [facility]/ [] Home Care/ [] Refused Recommended Disposition /[] [facility]/ []  Follow-up with PCP Additional Notes: Pt reports he tested positive for COVID early this AM around 0100. Pt reports symptoms began Wednesday night. Pt reports diarrhea, dry cough, chest tightness, and cough with SOB on exertion that started yesterday. Pt states he felt SOB the last time he had COVID as well. Pt has a hx of right-sided HF and DVT on Xarelto . Pt reports he had heart palpitations yesterday and his HR was 130 so he took a PRN metoprolol  and felt better. RN asked pt to check his O2 while on the phone, after the pt got up and walked to get his pulse ox, his HR was 117 with an O2 of 96. Pt felt SOB with that activity. Pt's HR decreased to 103 after that activity. RN advised pt to go to the ED given his symptoms and history, pt agreeable to do that. Pt states his wife stepped out of the house to pick him up some juice at the store and will be right back and can take him. RN advised pt if he develops chest pain, SOB at rest, palpitations, or any worsening that he needs to call 911. Pt verbalized understanding.     Copied From CRM [id number]. Reason for Triage: Patient tested positive for COVID and is requesting Paxlovid to be sent to [facility]. Symptoms started Tuesday night *cough, runny nose, sore throat, headache, vomiting, diarrhea  Reason for Disposition  History of prior "blood clot" in leg or lungs (i.e., deep vein thrombosis, pulmonary embolism)  Answer Assessment - Initial Assessment Questions 1. RESPIRATORY STATUS: "Describe your breathing?" (e.g., wheezing, shortness of breath,  unable to speak, severe coughing)      SOB with coughing and with walking  2. ONSET: "When did this breathing problem begin?"      Symptoms since Wednesday night, SOB only started yesterday 3. PATTERN "Does the difficult breathing come and go, or has it been constant since it started?"      Comes and goes with coughing or walking 4. SEVERITY: "How bad is your breathing?" (e.g., mild, moderate, severe)    - MILD: No SOB at rest, mild SOB with walking, speaks normally in sentences, can lie down, no retractions, pulse < 100.    - MODERATE: SOB at rest, SOB with minimal exertion and prefers to sit, cannot lie down flat, speaks in phrases, mild retractions, audible wheezing, pulse 100-120.    - SEVERE: Very SOB at rest, speaks in single words, struggling to breathe, sitting hunched forward, retractions, pulse > 120      SOB with coughing and walking - moderate, but does endorse constant chest tightness 5. RECURRENT SYMPTOM: "Have you had difficulty breathing before?" If Yes, ask: "When was the last time?" and "What happened that time?"      Yes, with COVID 6. CARDIAC HISTORY: "Do you have any history of heart disease?" (e.g., heart attack, angina, bypass surgery, angioplasty)      R-sided HF from sleep apnea 7. LUNG HISTORY: "Do you have any history of lung disease?"  (e.g., pulmonary embolus, asthma, emphysema)     Hx of DVT on Xarelto   8. CAUSE: "What do you think is causing the breathing problem?"  COVID 9. OTHER SYMPTOMS: "Do you have any other symptoms? (e.g., dizziness, runny nose, cough, chest pain, fever)     Developed symptoms Wednesday night, SOB developed yesterday. Last night at 0100 pt tested positive for COVID. Endorses diarrhea and was on the toilet for 2 hours last night, dry cough, tightness in the chest that is constant. Endorses R sided HF from sleep apnea. Denies CP or pressure. States he had chest tightness before with COVID. Pt states he took a metoprolol  for heart  palpitations that he had yesterday. States his pulse was 130 at that time. Is prescribed metoprolol  for a fast heart rate, takes it PRN. Fever today of 101.80F. Last night was 102.66F. Rheumatologist put him on a 20 day burst of steroids which he finished this AM. 10. O2 SATURATION MONITOR:  "Do you use an oxygen saturation monitor (pulse oximeter) at home?" If Yes, ask: "What is your reading (oxygen level) today?" "What is your usual oxygen saturation reading?" (e.g., 95%)       96 O2 HR 117 on the phone with the RN after getting up and walking to get his pulse ox. HR came down after pt sat  Protocols used: Breathing Difficulty-A-AH

## 2023-06-20 ENCOUNTER — Other Ambulatory Visit (HOSPITAL_BASED_OUTPATIENT_CLINIC_OR_DEPARTMENT_OTHER): Payer: Self-pay

## 2023-06-20 DIAGNOSIS — U071 COVID-19: Secondary | ICD-10-CM | POA: Diagnosis not present

## 2023-06-20 MED ORDER — LAGEVRIO 200 MG PO CAPS
ORAL_CAPSULE | ORAL | 0 refills | Status: DC
Start: 2023-06-20 — End: 2023-07-21
  Filled 2023-06-20: qty 40, 5d supply, fill #0

## 2023-06-20 MED ORDER — BENZONATATE 200 MG PO CAPS
ORAL_CAPSULE | ORAL | 0 refills | Status: DC
Start: 2023-06-20 — End: 2023-07-21
  Filled 2023-06-20: qty 21, 7d supply, fill #0

## 2023-06-24 ENCOUNTER — Other Ambulatory Visit (HOSPITAL_COMMUNITY): Payer: Self-pay

## 2023-06-24 ENCOUNTER — Other Ambulatory Visit (HOSPITAL_BASED_OUTPATIENT_CLINIC_OR_DEPARTMENT_OTHER): Payer: Self-pay

## 2023-06-24 MED ORDER — PREDNISONE 5 MG PO TABS
ORAL_TABLET | ORAL | 0 refills | Status: DC
Start: 2023-06-23 — End: 2023-07-21
  Filled 2023-06-24 (×2): qty 50, 20d supply, fill #0

## 2023-06-26 ENCOUNTER — Other Ambulatory Visit: Payer: Self-pay | Admitting: Physical Medicine & Rehabilitation

## 2023-06-26 ENCOUNTER — Other Ambulatory Visit: Payer: Self-pay

## 2023-06-26 ENCOUNTER — Other Ambulatory Visit (HOSPITAL_BASED_OUTPATIENT_CLINIC_OR_DEPARTMENT_OTHER): Payer: Self-pay

## 2023-06-26 DIAGNOSIS — M47816 Spondylosis without myelopathy or radiculopathy, lumbar region: Secondary | ICD-10-CM

## 2023-06-26 MED ORDER — MORPHINE SULFATE ER 30 MG PO TBCR
30.0000 mg | EXTENDED_RELEASE_TABLET | Freq: Two times a day (BID) | ORAL | 0 refills | Status: DC
  Filled 2023-06-26 – 2023-06-30 (×2): qty 60, 30d supply, fill #0

## 2023-06-27 ENCOUNTER — Other Ambulatory Visit: Payer: Self-pay

## 2023-06-30 ENCOUNTER — Other Ambulatory Visit (HOSPITAL_BASED_OUTPATIENT_CLINIC_OR_DEPARTMENT_OTHER): Payer: Self-pay

## 2023-07-01 DIAGNOSIS — F331 Major depressive disorder, recurrent, moderate: Secondary | ICD-10-CM | POA: Diagnosis not present

## 2023-07-02 DIAGNOSIS — Z4889 Encounter for other specified surgical aftercare: Secondary | ICD-10-CM | POA: Diagnosis not present

## 2023-07-07 ENCOUNTER — Other Ambulatory Visit (HOSPITAL_BASED_OUTPATIENT_CLINIC_OR_DEPARTMENT_OTHER): Payer: Self-pay

## 2023-07-07 DIAGNOSIS — D751 Secondary polycythemia: Secondary | ICD-10-CM | POA: Diagnosis not present

## 2023-07-07 DIAGNOSIS — E611 Iron deficiency: Secondary | ICD-10-CM | POA: Diagnosis not present

## 2023-07-07 MED ORDER — DESVENLAFAXINE SUCCINATE ER 50 MG PO TB24
50.0000 mg | ORAL_TABLET | Freq: Every day | ORAL | 2 refills | Status: DC
  Filled 2023-07-07: qty 30, 30d supply, fill #0
  Filled 2023-08-01 (×2): qty 30, 30d supply, fill #1
  Filled 2023-08-31: qty 30, 30d supply, fill #2

## 2023-07-07 MED ORDER — VILAZODONE HCL 40 MG PO TABS
40.0000 mg | ORAL_TABLET | Freq: Every day | ORAL | 2 refills | Status: DC
  Filled 2023-07-07: qty 30, 30d supply, fill #0
  Filled 2023-08-01 (×2): qty 30, 30d supply, fill #1
  Filled 2023-08-31: qty 30, 30d supply, fill #2

## 2023-07-08 ENCOUNTER — Other Ambulatory Visit: Payer: Self-pay

## 2023-07-08 DIAGNOSIS — F331 Major depressive disorder, recurrent, moderate: Secondary | ICD-10-CM | POA: Diagnosis not present

## 2023-07-10 ENCOUNTER — Other Ambulatory Visit (HOSPITAL_BASED_OUTPATIENT_CLINIC_OR_DEPARTMENT_OTHER): Payer: Self-pay

## 2023-07-10 ENCOUNTER — Other Ambulatory Visit: Payer: Self-pay

## 2023-07-13 ENCOUNTER — Other Ambulatory Visit: Payer: Self-pay | Admitting: Pharmacy Technician

## 2023-07-13 ENCOUNTER — Other Ambulatory Visit: Payer: Self-pay

## 2023-07-13 NOTE — Progress Notes (Signed)
 Specialty Pharmacy Refill Coordination Note  Jesse Bowers is a 58 y.o. adult contacted today regarding refills of specialty medication(s) Guselkumab  (Tremfya )   Patient requested [name] at [facility] [place] date: 07/31/23   Medication will be filled on 07/30/23.

## 2023-07-14 DIAGNOSIS — H26492 Other secondary cataract, left eye: Secondary | ICD-10-CM | POA: Diagnosis not present

## 2023-07-15 DIAGNOSIS — F331 Major depressive disorder, recurrent, moderate: Secondary | ICD-10-CM | POA: Diagnosis not present

## 2023-07-16 ENCOUNTER — Other Ambulatory Visit (HOSPITAL_BASED_OUTPATIENT_CLINIC_OR_DEPARTMENT_OTHER): Payer: Self-pay

## 2023-07-16 ENCOUNTER — Other Ambulatory Visit: Payer: Self-pay

## 2023-07-16 ENCOUNTER — Ambulatory Visit: Payer: Commercial Managed Care - PPO | Admitting: Family Medicine

## 2023-07-16 DIAGNOSIS — L400 Psoriasis vulgaris: Secondary | ICD-10-CM | POA: Diagnosis not present

## 2023-07-16 DIAGNOSIS — D3611 Benign neoplasm of peripheral nerves and autonomic nervous system of face, head, and neck: Secondary | ICD-10-CM | POA: Diagnosis not present

## 2023-07-16 DIAGNOSIS — D485 Neoplasm of uncertain behavior of skin: Secondary | ICD-10-CM | POA: Diagnosis not present

## 2023-07-16 MED ORDER — HALOBETASOL PROPIONATE 0.05 % EX CREA
TOPICAL_CREAM | CUTANEOUS | 2 refills | Status: DC
Start: 2023-07-16 — End: 2024-01-13
  Filled 2023-07-16: qty 50, 30d supply, fill #0
  Filled 2023-08-12: qty 50, 30d supply, fill #1

## 2023-07-16 MED ORDER — DESONIDE 0.05 % EX CREA
1.0000 | TOPICAL_CREAM | Freq: Two times a day (BID) | CUTANEOUS | 2 refills | Status: DC
Start: 2023-07-16 — End: 2023-11-13
  Filled 2023-07-16: qty 60, 30d supply, fill #0
  Filled 2023-08-12: qty 60, 30d supply, fill #1
  Filled 2023-09-11: qty 60, 30d supply, fill #2

## 2023-07-20 ENCOUNTER — Other Ambulatory Visit (HOSPITAL_BASED_OUTPATIENT_CLINIC_OR_DEPARTMENT_OTHER): Payer: Self-pay

## 2023-07-20 ENCOUNTER — Encounter (HOSPITAL_BASED_OUTPATIENT_CLINIC_OR_DEPARTMENT_OTHER): Payer: Self-pay

## 2023-07-21 ENCOUNTER — Other Ambulatory Visit (HOSPITAL_BASED_OUTPATIENT_CLINIC_OR_DEPARTMENT_OTHER): Payer: Self-pay

## 2023-07-21 ENCOUNTER — Ambulatory Visit: Admitting: Family Medicine

## 2023-07-21 ENCOUNTER — Encounter: Payer: Self-pay | Admitting: Family Medicine

## 2023-07-21 ENCOUNTER — Other Ambulatory Visit: Payer: Self-pay

## 2023-07-21 VITALS — BP 118/74 | HR 91 | Temp 98.3°F | Ht 67.0 in | Wt 215.5 lb

## 2023-07-21 DIAGNOSIS — F09 Unspecified mental disorder due to known physiological condition: Secondary | ICD-10-CM | POA: Diagnosis not present

## 2023-07-21 DIAGNOSIS — R635 Abnormal weight gain: Secondary | ICD-10-CM

## 2023-07-21 DIAGNOSIS — F411 Generalized anxiety disorder: Secondary | ICD-10-CM | POA: Diagnosis not present

## 2023-07-21 DIAGNOSIS — F5101 Primary insomnia: Secondary | ICD-10-CM | POA: Diagnosis not present

## 2023-07-21 DIAGNOSIS — F4310 Post-traumatic stress disorder, unspecified: Secondary | ICD-10-CM | POA: Diagnosis not present

## 2023-07-21 DIAGNOSIS — R61 Generalized hyperhidrosis: Secondary | ICD-10-CM | POA: Diagnosis not present

## 2023-07-21 DIAGNOSIS — F32A Depression, unspecified: Secondary | ICD-10-CM | POA: Diagnosis not present

## 2023-07-21 DIAGNOSIS — T50905A Adverse effect of unspecified drugs, medicaments and biological substances, initial encounter: Secondary | ICD-10-CM | POA: Diagnosis not present

## 2023-07-21 MED ORDER — HYDROXYZINE HCL 25 MG PO TABS
ORAL_TABLET | ORAL | 2 refills | Status: DC
  Filled 2023-07-21: qty 150, 30d supply, fill #0
  Filled 2023-09-23: qty 150, 30d supply, fill #1

## 2023-07-21 NOTE — Patient Instructions (Addendum)
 [email or web address]  [email or web address]  [email or web address]

## 2023-07-21 NOTE — Assessment & Plan Note (Signed)
 Pt has gained since his last visit, likely due to the prednisone  he was given, metformin  and phentermine  have so far been ineffective in managing pt's weight. He will look into using the GLP medications

## 2023-07-21 NOTE — Progress Notes (Signed)
 Established Patient Office Visit  Subjective   Patient ID: Jesse Bowers, adult    DOB: 02-04-1966  Age: 58 y.o. MRN: [id number]  Chief Complaint  Patient presents with   Medical Management of Chronic Issues    Pt is here for follow up today. States that he just finished a prednisone  taper for his psoriatic arthritis and was switched to tremfya  from his cosentyx , states that his psoriasis has much improved, Is also on SQ methotrexate . He had an EGD recently and found evidence of IBD, is waiting to hear about needing a colonoscopy to look for other signs in the colon.   He reports he stopped the metformin  because it did not seem to be working to help with weight reduction, states that he thinks it is because of the medications he is taking. I counseled the patient on the online compounding pharmacies and gave him the website to look at.   His pristiq  was reduced and he was started on viibrid. States that he is constantly sweating all the time and is wondering if it is his medication that is causing the excessive sweating. States that the sweating is generalized     Current Outpatient Medications  Medication Instructions   baclofen  (LIORESAL ) 20-40 mg, Oral, 4 times daily, 1 tablet with breakfast, lunch, and dinner and 2 tablets at bedtime   desonide  (DESOWEN ) 0.05 % cream Apply to affected area(s) twice daily   desonide  (DESOWEN ) 0.05 % cream 1 Application, Topical, 2 times daily   desvenlafaxine  (PRISTIQ ) 50 mg, Oral, Daily   famotidine  (PEPCID ) 40 MG tablet Take 1 tablet (40 mg total) by mouth nightly.   finasteride  (PROPECIA ) 1 mg, Oral, Daily   folic acid  (FOLVITE ) 1 MG tablet Take 1 tablet (1,000 mcg total) by mouth daily.   furosemide  (LASIX ) 80 mg, Oral, Daily   furosemide  (LASIX ) 20 mg, Oral, Daily PRN   guselkumab  (TREMFYA ) 100 MG/ML pen Inject 1 mL (100 mg total) under the skin every 8 (eight) weeks.   guselkumab  (TREMFYA ) 100 MG/ML pen Inject 100 mg at week 0 and 4,  then 100 mg every 8 weeks   halobetasol  (ULTRAVATE ) 0.05 % cream Apply 1(one) application(s) topical 2(two) times a day to hands, feet and knees   hydrOXYzine  (ATARAX ) 25 MG tablet Take 1 tablet (25 mg total) by mouth 3 (three) times daily AND 2 tablets (50 mg total) at bedtime as needed for sleep/anxiety   hydrOXYzine  (ATARAX ) 25 MG tablet Take 1 tablet (25 mg total) by mouth 3 (three) times daily. May also take 2 tablets (50 mg total) at bedtime as needed for sleep   Ibsrela  50 mg, Oral, 2 times daily   methotrexate  20 mg, Subcutaneous, Weekly   metoprolol  tartrate (LOPRESSOR ) 25 mg, Oral, 2 times daily PRN   morphine  (MS CONTIN ) 30 mg, Oral, Every 12 hours   ondansetron  (ZOFRAN ) 8 MG tablet Take 1 tablet (8 mg total) by mouth every 8 (eight) hours as needed for nausea or vomiting.   Oxycodone  HCl 10 mg, Oral, Every 8 hours PRN   Pancrelipase , Lip-Prot-Amyl, (CREON ) 24000-76000 units CPEP Take 2 capsules by mouth in the morning and 2 capsules at noon and 2 capsules in the evening. Take with meals.   potassium chloride  (KLOR-CON ) 10 MEQ tablet Take 2 tablets by mouth every day.   RABEprazole  (ACIPHEX ) 20 MG tablet Take 1 tablet (20 mg total) by mouth in the morning AND 1 tablet (20 mg total) every evening. Take before meals.  testosterone  cypionate (DEPOTESTOSTERONE CYPIONATE) 80 mg, Subcutaneous, Weekly   TUBERCULIN SYR 1CC/27GX1/2" 27G X 1/2" 1 ML MISC Use 1 syringe to inject under the skin once weekly   Vilazodone  HCl (VIIBRYD ) 40 mg, Oral, Daily   Xarelto  20 mg, Oral, (Dosepack) Nightly - one time    Patient Active Problem List   Diagnosis Date Noted   Thoracic spine pain 12/10/2022   Bilateral lumbar radiculopathy 10/31/2022   Lumbar disc disease 08/06/2022   Iliotibial band syndrome 08/06/2022   Non-recurrent acute suppurative otitis media of both ears without spontaneous rupture of tympanic membranes 07/09/2022   Weight gain due to medication 07/09/2022   Pancreatic insufficiency  04/09/2022   Posterior vitreous detachment of right eye 11/18/2021   Spondylosis of lumbar spine 10/03/2020   Pseudophakia of both eyes 05/07/2020   Retinal telangiectasis of both eyes 05/07/2020   Lattice degeneration of peripheral retina, left 09/08/2019   Lattice degeneration, right eye 09/08/2019   Chronic diastolic heart failure (HCC) 05/24/2019   Urinary dysfunction 04/12/2019   Osteoarthritis of right knee 08/23/2018   Constipation due to opioid therapy 03/30/2018   SNHL (sensorineural hearing loss) 12/04/2017   Abnormal urinary stream 12/03/2017   Osteoarthritis of carpometacarpal (CMC) joint of thumb 11/30/2017   Muscle weakness 11/17/2017   Transient vision disturbance 11/12/2017   Bilateral hand pain 10/30/2017   Pain of left hip joint 10/09/2017   Gynecomastia 07/10/2017   Iron deficiency anemia 07/05/2017   Spasticity 05/20/2017   Lumbar radiculitis 04/20/2017   Ulnar 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, left 11/28/2016   Idiopathic peripheral neuropathy 11/28/2016   Failed total knee arthroplasty, 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   Long term (current) use of anticoagulants 08/23/2015   Right upper quadrant abdominal pain 08/23/2015   Memory loss 05/10/2015   Gait abnormality 04/07/2015   Alkaline phosphatase elevation 04/07/2015   History of thrombosis 03/26/2015   Medial epicondylitis 02/07/2015   Cognitive decline 12/21/2014   Leukopenia 12/05/2014   Rotator cuff syndrome of right shoulder 10/27/2014   Status post left knee replacement 08/22/2014   Left lateral epicondylitis 08/22/2014   Chronic cerebral ischemia 08/18/2014   Arthrofibrosis of knee joint 08/17/2014   Cubital canal compression syndrome, left 08/17/2014   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   Chronic pain syndrome 04/10/2014   Insomnia 04/10/2014   Chronic non-specific white matter lesions on MRI 04/10/2014   CFS (chronic fatigue syndrome) 04/10/2014   Biceps tendonitis on left 03/01/2014   Polycythemia vera (HCC) 12/27/2013    H/O TIA (transient ischemic attack) and stroke 12/27/2013   Neck pain 12/27/2013   OSA (obstructive sleep apnea) 12/08/2013   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/31/2013   Protein C deficiency (HCC) 08/01/2013   Post traumatic stress disorder (PTSD) 08/01/2013   Speech abnormality 07/25/2013   Obesity 05/10/2013   Lower extremity edema 05/10/2013   GERD (gastroesophageal reflux disease) 05/10/2013   Arthritis 05/10/2013   OA (osteoarthritis) of knee 03/15/2013   Headache 10/25/2012   Palpitations 10/18/2012   Fatty liver determined by biopsy 06/01/2012   Arthropathic psoriasis, unspecified (HCC) 04/29/2012   Abnormal liver enzymes 03/29/2012   Psoriatic arthritis (HCC) 12/29/2011   Left Renal Hydronephrosis 12/11/2010   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     Objective:     BP 118/74   Pulse 91   Temp 98.3 F (  36.8 C) (Oral)   Ht 5\' 7"  (1.702 m)   Wt 215 lb 8 oz (97.8 kg)   SpO2 98%   BMI 33.75 kg/m    Physical Exam Vitals reviewed.  Constitutional:      Appearance: Normal appearance. He is well-groomed. He is obese.  Cardiovascular:     Rate and Rhythm: Normal rate and regular rhythm.     Heart sounds: S1 normal and S2 normal. No murmur heard. Pulmonary:     Effort: Pulmonary effort is normal.     Breath sounds: Normal breath sounds and air entry. No rales.  Neurological:     General: No focal deficit present.     Mental Status: He is alert and oriented to person, place, and time.     Gait: Gait is intact.  Psychiatric:        Mood and Affect: Mood and affect normal.      No results found for any visits on 07/21/23.    The ASCVD Risk score (Arnett DK, et al., 2019) failed to calculate for the following reasons:   Risk score cannot be calculated because  patient has a medical history suggesting prior/existing ASCVD    Assessment & Plan:  Generalized hyperhidrosis Pt is on multiple medications that can cause this problem. He reports it has been going on for years, we discussed topical treatments for this. 20 minutes spent with patient today on this topic and the weight gain.   Weight gain due to medication Assessment & Plan: Pt has gained since his last visit, likely due to the prednisone  he was given, metformin  and phentermine  have so far been ineffective in managing pt's weight. He will look into using the GLP medications       Return in about 6 months (around 01/21/2024).    Aida House, MD

## 2023-07-22 ENCOUNTER — Other Ambulatory Visit (HOSPITAL_BASED_OUTPATIENT_CLINIC_OR_DEPARTMENT_OTHER): Payer: Self-pay

## 2023-07-22 ENCOUNTER — Other Ambulatory Visit: Payer: Self-pay

## 2023-07-22 DIAGNOSIS — F331 Major depressive disorder, recurrent, moderate: Secondary | ICD-10-CM | POA: Diagnosis not present

## 2023-07-22 NOTE — Progress Notes (Signed)
 Tremfya  device switched to OnePress. Rph must counsel before next fill.

## 2023-07-23 DIAGNOSIS — H26491 Other secondary cataract, right eye: Secondary | ICD-10-CM | POA: Diagnosis not present

## 2023-07-23 DIAGNOSIS — H35413 Lattice degeneration of retina, bilateral: Secondary | ICD-10-CM | POA: Diagnosis not present

## 2023-07-23 DIAGNOSIS — H04123 Dry eye syndrome of bilateral lacrimal glands: Secondary | ICD-10-CM | POA: Diagnosis not present

## 2023-07-23 DIAGNOSIS — Z961 Presence of intraocular lens: Secondary | ICD-10-CM | POA: Diagnosis not present

## 2023-07-23 DIAGNOSIS — H538 Other visual disturbances: Secondary | ICD-10-CM | POA: Diagnosis not present

## 2023-07-28 ENCOUNTER — Other Ambulatory Visit (HOSPITAL_BASED_OUTPATIENT_CLINIC_OR_DEPARTMENT_OTHER): Payer: Self-pay

## 2023-07-29 ENCOUNTER — Other Ambulatory Visit (HOSPITAL_COMMUNITY): Payer: Self-pay

## 2023-07-29 ENCOUNTER — Other Ambulatory Visit: Payer: Self-pay | Admitting: Physical Medicine & Rehabilitation

## 2023-07-29 ENCOUNTER — Other Ambulatory Visit (HOSPITAL_BASED_OUTPATIENT_CLINIC_OR_DEPARTMENT_OTHER): Payer: Self-pay

## 2023-07-29 ENCOUNTER — Other Ambulatory Visit: Payer: Self-pay | Admitting: Pharmacist

## 2023-07-29 ENCOUNTER — Other Ambulatory Visit: Payer: Self-pay

## 2023-07-29 ENCOUNTER — Encounter (HOSPITAL_BASED_OUTPATIENT_CLINIC_OR_DEPARTMENT_OTHER): Payer: Self-pay

## 2023-07-29 DIAGNOSIS — M47816 Spondylosis without myelopathy or radiculopathy, lumbar region: Secondary | ICD-10-CM

## 2023-07-29 DIAGNOSIS — F331 Major depressive disorder, recurrent, moderate: Secondary | ICD-10-CM | POA: Diagnosis not present

## 2023-07-29 MED ORDER — TREMFYA ONE-PRESS 100 MG/ML ~~LOC~~ SOAJ: 100.0000 mg | SUBCUTANEOUS | 5 refills | Status: DC

## 2023-07-29 MED ORDER — MORPHINE SULFATE ER 30 MG PO TBCR
30.0000 mg | EXTENDED_RELEASE_TABLET | Freq: Two times a day (BID) | ORAL | 0 refills | Status: DC
Start: 2023-07-29 — End: 2023-08-10
  Filled 2023-07-29 (×2): qty 30, 15d supply, fill #0

## 2023-07-29 MED ORDER — TREMFYA ONE-PRESS 100 MG/ML ~~LOC~~ SOAJ
100.0000 mg | SUBCUTANEOUS | 5 refills | Status: DC
  Filled 2023-07-29: qty 1, 56d supply, fill #0

## 2023-07-29 NOTE — Progress Notes (Signed)
 5/28/25Liana Reding MD office & spoke with Myah requesting Tremfya  Onepress to completed initial fill week 4 due on 5/31. & requested a Maintenance dose for 8 weeks. Patient aware. Please sent to [facility] for Re-write.

## 2023-07-29 NOTE — Progress Notes (Signed)
 Spoke to patient. Device he had received for previous dose was OnePress device and he is already familiar with injection technique.

## 2023-07-30 ENCOUNTER — Other Ambulatory Visit (HOSPITAL_BASED_OUTPATIENT_CLINIC_OR_DEPARTMENT_OTHER): Payer: Self-pay

## 2023-07-30 ENCOUNTER — Other Ambulatory Visit: Payer: Self-pay

## 2023-07-30 MED ORDER — METHOTREXATE SODIUM CHEMO INJECTION 50 MG/2ML
20.0000 mg | INTRAMUSCULAR | 0 refills | Status: DC
  Filled 2023-07-30 – 2023-08-04 (×2): qty 10, 84d supply, fill #0

## 2023-07-31 ENCOUNTER — Encounter: Payer: Self-pay | Admitting: Physical Medicine & Rehabilitation

## 2023-07-31 ENCOUNTER — Encounter: Payer: Self-pay | Admitting: Family Medicine

## 2023-07-31 ENCOUNTER — Other Ambulatory Visit (HOSPITAL_BASED_OUTPATIENT_CLINIC_OR_DEPARTMENT_OTHER): Payer: Self-pay

## 2023-07-31 DIAGNOSIS — R635 Abnormal weight gain: Secondary | ICD-10-CM

## 2023-07-31 MED ORDER — METFORMIN HCL 500 MG PO TABS
500.0000 mg | ORAL_TABLET | Freq: Two times a day (BID) | ORAL | 1 refills | Status: DC
  Filled 2023-07-31: qty 180, 90d supply, fill #0

## 2023-08-01 ENCOUNTER — Other Ambulatory Visit (HOSPITAL_BASED_OUTPATIENT_CLINIC_OR_DEPARTMENT_OTHER): Payer: Self-pay

## 2023-08-04 ENCOUNTER — Other Ambulatory Visit (HOSPITAL_BASED_OUTPATIENT_CLINIC_OR_DEPARTMENT_OTHER): Payer: Self-pay

## 2023-08-06 DIAGNOSIS — F331 Major depressive disorder, recurrent, moderate: Secondary | ICD-10-CM | POA: Diagnosis not present

## 2023-08-10 ENCOUNTER — Encounter: Payer: Self-pay | Admitting: Registered Nurse

## 2023-08-10 ENCOUNTER — Encounter: Attending: Physical Medicine & Rehabilitation | Admitting: Registered Nurse

## 2023-08-10 ENCOUNTER — Other Ambulatory Visit (HOSPITAL_BASED_OUTPATIENT_CLINIC_OR_DEPARTMENT_OTHER): Payer: Self-pay

## 2023-08-10 ENCOUNTER — Other Ambulatory Visit: Payer: Self-pay | Admitting: Physical Medicine & Rehabilitation

## 2023-08-10 VITALS — BP 120/81 | HR 90 | Ht 67.0 in | Wt 219.0 lb

## 2023-08-10 DIAGNOSIS — M546 Pain in thoracic spine: Secondary | ICD-10-CM | POA: Diagnosis not present

## 2023-08-10 DIAGNOSIS — Z5181 Encounter for therapeutic drug level monitoring: Secondary | ICD-10-CM | POA: Insufficient documentation

## 2023-08-10 DIAGNOSIS — Z79891 Long term (current) use of opiate analgesic: Secondary | ICD-10-CM | POA: Diagnosis not present

## 2023-08-10 DIAGNOSIS — G894 Chronic pain syndrome: Secondary | ICD-10-CM | POA: Insufficient documentation

## 2023-08-10 DIAGNOSIS — G8929 Other chronic pain: Secondary | ICD-10-CM | POA: Insufficient documentation

## 2023-08-10 DIAGNOSIS — L4050 Arthropathic psoriasis, unspecified: Secondary | ICD-10-CM | POA: Insufficient documentation

## 2023-08-10 DIAGNOSIS — M47816 Spondylosis without myelopathy or radiculopathy, lumbar region: Secondary | ICD-10-CM

## 2023-08-10 MED ORDER — OXYCODONE HCL 10 MG PO TABS
10.0000 mg | ORAL_TABLET | Freq: Three times a day (TID) | ORAL | 0 refills | Status: DC | PRN
  Filled 2023-08-10: qty 90, 30d supply, fill #0

## 2023-08-10 MED ORDER — MORPHINE SULFATE ER 30 MG PO TBCR
30.0000 mg | EXTENDED_RELEASE_TABLET | Freq: Two times a day (BID) | ORAL | 0 refills | Status: DC
  Filled 2023-08-10 – 2023-08-28 (×2): qty 60, 30d supply, fill #0

## 2023-08-10 NOTE — Progress Notes (Signed)
 Subjective:    Patient ID: Jesse Bowers, adult    DOB: 1965-03-31, 58 y.o.   MRN: [id number]  HPI: Jesse Bowers is a 58 y.o. male who returns for follow up appointment for chronic pain and medication refill. He states his pain is located in his mid- lower back and reports his lower extremities are achy.He  rates his  pain 8. His current exercise regime is walking short distances  and performing stretching exercises.  Jesse Bowers Morphine  equivalent is 105.00 MME.   Oral Swab was Performed today.    Pain Inventory Average Pain 6 Pain Right Now 8 My pain is constant, sharp, burning, stabbing, tingling, and aching  In the last 24 hours, has pain interfered with the following? General activity 9 Relation with others 7 Enjoyment of life 9 What TIME of day is your pain at its worst? morning  and night Sleep (in general) Fair  Pain is worse with: walking, bending, standing, and some activites Pain improves with: medication, TENS, and injections Relief from Meds: na  Family History  Problem Relation Age of Onset   Stroke Maternal Grandfather        26   Heart attack Maternal Grandfather    Glaucoma Maternal Grandfather    Macular degeneration Maternal Grandfather    Breast cancer Sister    Hypertension Mother    Psoriasis Mother    Other Mother        meningioma developed ~2019   Glaucoma Mother    Cancer Paternal Grandfather    Heart attack Paternal Grandfather    Stroke Paternal Uncle        age 58   Polycythemia Paternal Uncle    Stroke Maternal Grandmother    Congestive Heart Failure Maternal Grandmother    Heart attack Maternal Grandmother    Protein C deficiency Sister 66       Miscarriages   Breast cancer Maternal Aunt 35   Social History   Socioeconomic History   Marital status: Married    Spouse name: Not on file   Number of children: 2   Years of education: 4y college   Highest education level: Master's degree (e.g., MA, MS, MEng, MEd, MSW,  MBA)  Occupational History   Occupation: Restaurant manager, fast food    Comment: Not working since CVA 2015  Tobacco Use   Smoking status: Never   Smokeless tobacco: Never  Vaping Use   Vaping status: Never Used  Substance and Sexual Activity   Alcohol  use: Yes    Comment: social   Drug use: No   Sexual activity: Yes    Birth control/protection: None    Comment: patient is a transgender on testosterone  shots, no biological kids  Other Topics Concern   Not on file  Social History Narrative   Not on file   Social Drivers of Health   Financial Resource Strain: Low Risk  (03/12/2023)   Overall Financial Resource Strain (CARDIA)    Difficulty of Paying Living Expenses: Not very hard  Recent Concern: Financial Resource Strain - High Risk (12/12/2022)   Overall Financial Resource Strain (CARDIA)    Difficulty of Paying Living Expenses: Hard  Food Insecurity: Low Risk  (05/25/2023)   Received from [facility]   Hunger Vital Sign    Worried About Running Out of Food in the Last Year: Never true    Ran Out of Food in the Last Year: Never true  Transportation Needs: No Transportation Needs (05/25/2023)   Received  from [facility]    In the past 12 months, has lack of reliable transportation kept you from medical appointments, meetings, work or from getting things needed for daily living? : No  Recent Concern: Transportation Needs - Unmet Transportation Needs (03/12/2023)   PRAPARE - Transportation    Lack of Transportation (Medical): Yes    Lack of Transportation (Non-Medical): Yes  Physical Activity: Unknown (03/12/2023)   Exercise Vital Sign    Days of Exercise per Week: 0 days    Minutes of Exercise per Session: Not on file  Stress: Stress Concern Present (03/12/2023)   Jesse Bowers [employer] - Occupational Stress Questionnaire    Feeling of Stress : Rather much  Social Connections: Socially Isolated (03/12/2023)   Social Connection and Isolation  Panel [NHANES]    Frequency of Communication with Friends and Family: Never    Frequency of Social Gatherings with Friends and Family: Never    Attends Religious Services: Never    Database administrator or Organizations: No    Attends Engineer, structural: Not on file    Marital Status: Married   Past Surgical History:  Procedure Laterality Date   ABDOMINAL HYSTERECTOMY Bilateral 1994   TAH, BSO- tranverse incision at 58 yo   ANKLE ARTHROSCOPY WITH RECONSTRUCTION Right 2007   CHOLECYSTECTOMY     laparoscopic   COLONOSCOPY     x3   EYE SURGERY     Left eye 03/02/2018, right 02/15/2018   HEMORROIDECTOMY  08/19/2020   HIP ARTHROSCOPY W/ LABRAL REPAIR Right 05/11/2013   acetabular labral tear 03/30/2013   KNEE ARTHROPLASTY Right    KNEE JOINT MANIPULATION Left    x3 under anesthesia   KNEE SURGERY Bilateral 1984   Right ACL, left PCL repair   LITHOTRIPSY  2005   LIVER BIOPSY  2013   normal results.   MASTECTOMY Bilateral    prior to 2009   MOUTH SURGERY     NASAL SEPTUM SURGERY N/A 09/20/2015   by ENT Dr. Odean Bend   OVARIAN CYST SURGERY Left    size of grapefruit, was informed that she had shortened vagina   SHOULDER SURGERY Bilateral    Right 08/15/2016, Left 11/15/2016   THUMB ARTHROSCOPY Left    THYROIDECTOMY, PARTIAL Left 2008   TOTAL KNEE ARTHROPLASTY Right 08/23/2018   Procedure: TOTAL KNEE ARTHROPLASTY;  Surgeon: Liliane Rei, MD;  Location: [facility];  Service: Orthopedics;  Laterality: Right;    TOTAL KNEE REVISION Left 02/06/2016   Procedure: LEFT TOTAL KNEE REVISION;  Surgeon: Liliane Rei, MD;  Location: [facility];  Service: Orthopedics;  Laterality: Left;   TOTAL KNEE REVISION Left 04/22/2017   Procedure: Left knee polyethylene revision;  Surgeon: Liliane Rei, MD;  Location: [facility];  Service: Orthopedics;  Laterality: Left;   UPPER GI ENDOSCOPY  2003   Past Surgical History:  Procedure Laterality Date   ABDOMINAL HYSTERECTOMY Bilateral 1994    TAH, BSO- tranverse incision at 58 yo   ANKLE ARTHROSCOPY WITH RECONSTRUCTION Right 2007   CHOLECYSTECTOMY     laparoscopic   COLONOSCOPY     x3   EYE SURGERY     Left eye 03/02/2018, right 02/15/2018   HEMORROIDECTOMY  08/19/2020   HIP ARTHROSCOPY W/ LABRAL REPAIR Right 05/11/2013   acetabular labral tear 03/30/2013   KNEE ARTHROPLASTY Right    KNEE JOINT MANIPULATION Left    x3 under anesthesia   KNEE SURGERY Bilateral 1984   Right ACL,  left PCL repair   LITHOTRIPSY  2005   LIVER BIOPSY  2013   normal results.   MASTECTOMY Bilateral    prior to 2009   MOUTH SURGERY     NASAL SEPTUM SURGERY N/A 09/20/2015   by ENT Dr. Odean Bend   OVARIAN CYST SURGERY Left    size of grapefruit, was informed that she had shortened vagina   SHOULDER SURGERY Bilateral    Right 08/15/2016, Left 11/15/2016   THUMB ARTHROSCOPY Left    THYROIDECTOMY, PARTIAL Left 2008   TOTAL KNEE ARTHROPLASTY Right 08/23/2018   Procedure: TOTAL KNEE ARTHROPLASTY;  Surgeon: Liliane Rei, MD;  Location: [facility];  Service: Orthopedics;  Laterality: Right;    TOTAL KNEE REVISION Left 02/06/2016   Procedure: LEFT TOTAL KNEE REVISION;  Surgeon: Liliane Rei, MD;  Location: [facility];  Service: Orthopedics;  Laterality: Left;   TOTAL KNEE REVISION Left 04/22/2017   Procedure: Left knee polyethylene revision;  Surgeon: Liliane Rei, MD;  Location: [facility];  Service: Orthopedics;  Laterality: Left;   UPPER GI ENDOSCOPY  2003   Past Medical History:  Diagnosis Date   Abnormal weight loss    Anxiety    Arthritis    Cataract    OU   Celiac disease    Cervical neck pain with evidence of disc disease    patient has a cyst    Chronic constipation    Chronic diastolic heart failure (HCC)    Pt. denies   Chronic pain    Degenerative disc disease at L5-S1 level    with stenosis   DVT (deep venous thrombosis) (HCC)    Right upper arm, bilateral leg   Eczema    inguinal, feet   Elevated liver enzymes    Failed total  knee arthroplasty (HCC) 04/22/2017   Family history of adverse reaction to anesthesia    family has problems with anesthesia of nausea and vomiting    GERD (gastroesophageal reflux disease)    History of in 20's   Gluten enteropathy    H/O parotitis    right    Hard of hearing    History of kidney stones    History of retinal tear    Bilateral   History of staph infection    required wound vac   Hx-TIA (transient ischemic attack)    2015   Kidney stones 08/2020   LVH (left ventricular hypertrophy) 12/15/2016   Mild, noted on ECHO   MVP (mitral valve prolapse)    NAFL (nonalcoholic fatty liver)    Neck pain    Neuromuscular disorder (HCC)    bilateral neuropathy feet.   Nuclear sclerotic cataract of both eyes 09/08/2019   Pneumonia 12/17/2010   Polycythemia    Polycythemia, secondary    PONV (postoperative nausea and vomiting)    Protein C deficiency (HCC)    Dr. Anitra Barn   Psoriasis    16 X10 cm psoriatic rash on sole of left foot ; open and occ scant bleeding;    psoriatic arthritis    PTSD (post-traumatic stress disorder)    Scaphoid fracture of wrist 09/23/2013   Seizure (HCC)    childhood, medication until age 19 then weanned completely off   Sleep apnea    split night study last done by Dr. Godwin Lat 06/18/15 shows severe OSA, CSA, and hypersomnia, rec bipap   Splenomegaly    Stenosis of ureteropelvic junction (UPJ)    left   Stroke (HCC)    CVA vs  TIA in left cerebrum causing slight right sided weakness-Dr. Godwin Lat follows   Syrinx of spinal cord (HCC) 01/06/2014   c spine on MRI   Tachycardia    hx of    Transfusion history    past history- none recent, after surgeries due to blood loss   Transgender with history of gender affirmation surgery    Wears glasses    Wears hearing aid    There were no vitals taken for this visit.  Opioid Risk Score:   Fall Risk Score:  `1  Depression screen PHQ 2/9     08/10/2023    1:12 PM 04/14/2023    9:23 AM 01/16/2023     2:31 PM 12/16/2022   12:59 PM 12/10/2022   10:48 AM 08/06/2022    3:14 PM 07/07/2022    1:32 PM  Depression screen PHQ 2/9  Decreased Interest 0 1 1 1  0 0 1  Down, Depressed, Hopeless 0 1 2 1  0 0 1  PHQ - 2 Score 0 2 3 2  0 0 2  Altered sleeping  3 3 1   1   Tired, decreased energy  1 1 1      Change in appetite  0 0 1   0  Feeling bad or failure about yourself   0 3 0   0  Trouble concentrating  1 3 1   2   Moving slowly or fidgety/restless  0 0 0   0  Suicidal thoughts  0 0 0   0  PHQ-9 Score  7 13 6   5   Difficult doing work/chores  Somewhat difficult Very difficult Somewhat difficult   Somewhat difficult     Review of Systems  Musculoskeletal:  Positive for back pain, gait problem and neck pain.  All other systems reviewed and are negative.      Objective:   Physical Exam Vitals and nursing note reviewed.  Constitutional:      Appearance: Normal appearance.   Cardiovascular:     Rate and Rhythm: Normal rate and regular rhythm.     Pulses: Normal pulses.     Heart sounds: Normal heart sounds.  Pulmonary:     Effort: Pulmonary effort is normal.     Breath sounds: Normal breath sounds.   Musculoskeletal:     Comments: Normal Muscle Bulk and Muscle Testing Reveals:  Upper Extremities: Full ROM and Muscle Strength 5/5 Lumbar Paraspinal Tenderness: L-3-L-5 Lower Extremities : Decreased ROM and Muscle Strength 4/5 Bilateral Lower Extremities Flexion Produces Pain into his Bilateral Lower Extremities Arises from Table Slowly Narrow Based Gait      Skin:    General: Skin is warm and dry.   Neurological:     Mental Status: He is alert and oriented to person, place, and time.   Psychiatric:        Mood and Affect: Mood normal.        Behavior: Behavior normal.         Assessment & Plan:  1. Psoriatic arthritis with pain in multiple areas, most prominently feet, hands, elbows. Refilled: MS Contin  30 mg one every 12 hours #60, and  Oxycodone  10 mg one tablet every 8  hours as needed  08/10/2023. We will continue the opioid monitoring program, this consists of regular clinic visits, examinations, urine drug screen, pill counts as well as use of [place]  Controlled Substance Reporting system. A 12 month History has been reviewed on the [place]  Controlled Substance Reporting System 06/06/2022. Rheumatology Following.  2. Chronic Pain Syndrome: Continue Compound Cream. 08/10/2022 3. 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 08/10/2023 4. 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 08/10/2023 S/P TKR on 07/03/14:  Ortho Following: Dr. France Ina perform Total Left Knee Revision on 02/06/2016. S/P Left Knee Revision on 04/22/17. S/P  Right TKA on 08/23/2018 with Dr. France Ina. 5. Chronic mid- low back pain: Continue current medication regime, and Continue Physical therapy. Continue to Monitor. Aaron Aas 08/10/2023 6. Polycythemia: Oncology Following. 08/10/2023 7. Depression with anxiety : Psychiatry following.Continue to Monitor. 08/10/2023 8. Cervicalgia: No complaints Today. Continue HEP as tolerated  and Continue to Monitor. Continue current medication regime. 08/10/2023 9. Muscle Spasticity: Continue Baclofen  08/10/2023 10. Right Ankle Pain: No complaints Today. Continue to Monitor. 08/10/2023 10. Left lateral epicondylitis:Ortho Following. 08/10/2023 11. Bilateral Shoulder Pain: Continue HEP as Tolerated, Continue to Monitor. Ortho Following.08/10/2023 12. Lumbar Radiculitis: S/P  on 12/21/2020 Lumbar epidural steroid injection under fluoroscopic guidance       We will continue to Monitor. 08/09/2024  13.Left Greater Trochanteric Bursitis: No complaints today Contiue HEP as Tolerated. Continue to Monitor. 08/10/2023     F/U in 2 months

## 2023-08-12 ENCOUNTER — Other Ambulatory Visit: Payer: Self-pay

## 2023-08-12 ENCOUNTER — Other Ambulatory Visit (HOSPITAL_BASED_OUTPATIENT_CLINIC_OR_DEPARTMENT_OTHER): Payer: Self-pay

## 2023-08-12 DIAGNOSIS — M3500 Sicca syndrome, unspecified: Secondary | ICD-10-CM | POA: Diagnosis not present

## 2023-08-12 DIAGNOSIS — L409 Psoriasis, unspecified: Secondary | ICD-10-CM | POA: Diagnosis not present

## 2023-08-12 DIAGNOSIS — F331 Major depressive disorder, recurrent, moderate: Secondary | ICD-10-CM | POA: Diagnosis not present

## 2023-08-12 DIAGNOSIS — L4050 Arthropathic psoriasis, unspecified: Secondary | ICD-10-CM | POA: Diagnosis not present

## 2023-08-12 DIAGNOSIS — D751 Secondary polycythemia: Secondary | ICD-10-CM | POA: Diagnosis not present

## 2023-08-12 DIAGNOSIS — E611 Iron deficiency: Secondary | ICD-10-CM | POA: Diagnosis not present

## 2023-08-13 ENCOUNTER — Other Ambulatory Visit (HOSPITAL_BASED_OUTPATIENT_CLINIC_OR_DEPARTMENT_OTHER): Payer: Self-pay

## 2023-08-14 ENCOUNTER — Other Ambulatory Visit (HOSPITAL_BASED_OUTPATIENT_CLINIC_OR_DEPARTMENT_OTHER): Payer: Self-pay

## 2023-08-14 DIAGNOSIS — F331 Major depressive disorder, recurrent, moderate: Secondary | ICD-10-CM | POA: Diagnosis not present

## 2023-08-15 LAB — DRUG TOX MONITOR 1 W/CONF, ORAL FLD
Amphetamines: NEGATIVE ng/mL (ref <10)
Barbiturates: NEGATIVE ng/mL (ref <10)
Benzodiazepines: NEGATIVE ng/mL (ref <0.50)
Buprenorphine: NEGATIVE ng/mL (ref <0.10)
Cocaine: NEGATIVE ng/mL (ref <5.0)
Codeine: NEGATIVE ng/mL (ref <2.5)
Dihydrocodeine: NEGATIVE ng/mL (ref <2.5)
Fentanyl: NEGATIVE ng/mL (ref <0.10)
Heroin Metabolite: NEGATIVE ng/mL (ref <1.0)
Hydrocodone: NEGATIVE ng/mL (ref <2.5)
Hydromorphone: NEGATIVE ng/mL (ref <2.5)
MARIJUANA: NEGATIVE ng/mL (ref <2.5)
MDMA: NEGATIVE ng/mL (ref <10)
Meprobamate: NEGATIVE ng/mL (ref <2.5)
Methadone: NEGATIVE ng/mL (ref <5.0)
Morphine: 4.5 ng/mL — ABNORMAL HIGH (ref <2.5)
Nicotine Metabolite: NEGATIVE ng/mL (ref <5.0)
Norhydrocodone: NEGATIVE ng/mL (ref <2.5)
Noroxycodone: 8.2 ng/mL — ABNORMAL HIGH (ref <2.5)
Opiates: POSITIVE ng/mL — AB (ref <2.5)
Oxycodone: 15.5 ng/mL — ABNORMAL HIGH (ref <2.5)
Oxymorphone: NEGATIVE ng/mL (ref <2.5)
Phencyclidine: NEGATIVE ng/mL (ref <10)
Tapentadol: NEGATIVE ng/mL (ref <5.0)
Tramadol: NEGATIVE ng/mL (ref <5.0)
Zolpidem: NEGATIVE ng/mL (ref <5.0)

## 2023-08-15 LAB — DRUG TOX ALC METAB W/CON, ORAL FLD: Alcohol Metabolite: NEGATIVE ng/mL (ref <25)

## 2023-08-18 ENCOUNTER — Encounter (HOSPITAL_COMMUNITY): Payer: Self-pay

## 2023-08-18 ENCOUNTER — Other Ambulatory Visit (HOSPITAL_COMMUNITY): Payer: Self-pay

## 2023-08-18 ENCOUNTER — Other Ambulatory Visit (HOSPITAL_BASED_OUTPATIENT_CLINIC_OR_DEPARTMENT_OTHER): Payer: Self-pay

## 2023-08-18 ENCOUNTER — Other Ambulatory Visit: Payer: Self-pay

## 2023-08-18 ENCOUNTER — Encounter (HOSPITAL_BASED_OUTPATIENT_CLINIC_OR_DEPARTMENT_OTHER): Payer: Self-pay

## 2023-08-19 ENCOUNTER — Encounter: Payer: Self-pay | Admitting: Family Medicine

## 2023-08-19 DIAGNOSIS — F331 Major depressive disorder, recurrent, moderate: Secondary | ICD-10-CM | POA: Diagnosis not present

## 2023-08-19 DIAGNOSIS — R531 Weakness: Secondary | ICD-10-CM

## 2023-08-19 DIAGNOSIS — R296 Repeated falls: Secondary | ICD-10-CM

## 2023-08-20 ENCOUNTER — Other Ambulatory Visit: Payer: Self-pay

## 2023-08-20 ENCOUNTER — Other Ambulatory Visit (HOSPITAL_COMMUNITY): Payer: Self-pay

## 2023-08-24 ENCOUNTER — Other Ambulatory Visit: Payer: Self-pay

## 2023-08-24 NOTE — Progress Notes (Signed)
 Specialty Pharmacy Refill Coordination Note  Adar Rase is a 58 y.o. adult contacted today regarding refills of specialty medication(s) Guselkumab  (Tremfya  One-Press)   Patient requested [name] at [facility] at [place] date: 09/11/23   Medication will be filled on 09/10/23. Pending a new prescription from the office for the prefilled syringe instead of the one touch.  See intervention notes below.  Patient requested this change.   Clinical Intervention Note  Clinical Intervention Notes: Patient reports that he has more pain and bleeding with the one-press device and wanted to know the gauge of the one-press vs. the prefilled syringe.  After investigating, the needle gauge is not listed on the packaging of either.  I reached out to [employer] and after 2 calls and 45 minutes of holding time they report that both devices use 27 gauge needles. I provided this information to the patient who would still like to try the prefilled syringe.  I have contacted the office requesting a new prescription be sent over and providing the [facility] for the prefilled syringe [phone]).   Clinical Intervention Outcomes: Prevention of an adverse drug event   Silvano LOISE Blair Karel Santa

## 2023-08-25 ENCOUNTER — Other Ambulatory Visit (HOSPITAL_BASED_OUTPATIENT_CLINIC_OR_DEPARTMENT_OTHER): Payer: Self-pay

## 2023-08-25 ENCOUNTER — Other Ambulatory Visit: Payer: Self-pay

## 2023-08-25 ENCOUNTER — Other Ambulatory Visit: Payer: Self-pay | Admitting: Pharmacist

## 2023-08-25 ENCOUNTER — Other Ambulatory Visit (HOSPITAL_COMMUNITY): Payer: Self-pay

## 2023-08-25 MED ORDER — TREMFYA 100 MG/ML ~~LOC~~ SOSY
PREFILLED_SYRINGE | SUBCUTANEOUS | 5 refills | Status: DC
  Filled 2023-08-25: qty 1, 56d supply, fill #0
  Filled 2023-11-04 – 2023-11-05 (×2): qty 1, 56d supply, fill #1

## 2023-08-25 MED ORDER — TREMFYA 100 MG/ML ~~LOC~~ SOSY
PREFILLED_SYRINGE | SUBCUTANEOUS | 5 refills | Status: DC
  Filled 2023-08-25: qty 1, 56d supply, fill #0

## 2023-08-26 DIAGNOSIS — F331 Major depressive disorder, recurrent, moderate: Secondary | ICD-10-CM | POA: Diagnosis not present

## 2023-08-28 ENCOUNTER — Other Ambulatory Visit (HOSPITAL_BASED_OUTPATIENT_CLINIC_OR_DEPARTMENT_OTHER): Payer: Self-pay

## 2023-08-28 ENCOUNTER — Other Ambulatory Visit: Payer: Self-pay

## 2023-08-31 ENCOUNTER — Other Ambulatory Visit (HOSPITAL_BASED_OUTPATIENT_CLINIC_OR_DEPARTMENT_OTHER): Payer: Self-pay

## 2023-09-01 ENCOUNTER — Other Ambulatory Visit (HOSPITAL_BASED_OUTPATIENT_CLINIC_OR_DEPARTMENT_OTHER): Payer: Self-pay

## 2023-09-01 DIAGNOSIS — R790 Abnormal level of blood mineral: Secondary | ICD-10-CM | POA: Diagnosis not present

## 2023-09-01 DIAGNOSIS — Z1211 Encounter for screening for malignant neoplasm of colon: Secondary | ICD-10-CM | POA: Diagnosis not present

## 2023-09-01 DIAGNOSIS — K900 Celiac disease: Secondary | ICD-10-CM | POA: Diagnosis not present

## 2023-09-01 DIAGNOSIS — R197 Diarrhea, unspecified: Secondary | ICD-10-CM | POA: Diagnosis not present

## 2023-09-01 MED ORDER — FAMOTIDINE 40 MG PO TABS
40.0000 mg | ORAL_TABLET | Freq: Every evening | ORAL | 3 refills | Status: AC
  Filled 2023-09-01 – 2024-01-26 (×4): qty 90, 90d supply, fill #0

## 2023-09-02 DIAGNOSIS — F331 Major depressive disorder, recurrent, moderate: Secondary | ICD-10-CM | POA: Diagnosis not present

## 2023-09-03 DIAGNOSIS — Z7901 Long term (current) use of anticoagulants: Secondary | ICD-10-CM | POA: Diagnosis not present

## 2023-09-03 DIAGNOSIS — Z79620 Long term (current) use of immunosuppressive biologic: Secondary | ICD-10-CM | POA: Diagnosis not present

## 2023-09-03 DIAGNOSIS — K900 Celiac disease: Secondary | ICD-10-CM | POA: Diagnosis not present

## 2023-09-03 DIAGNOSIS — Z79899 Other long term (current) drug therapy: Secondary | ICD-10-CM | POA: Diagnosis not present

## 2023-09-03 DIAGNOSIS — L4050 Arthropathic psoriasis, unspecified: Secondary | ICD-10-CM | POA: Diagnosis not present

## 2023-09-03 DIAGNOSIS — D6859 Other primary thrombophilia: Secondary | ICD-10-CM | POA: Diagnosis not present

## 2023-09-03 DIAGNOSIS — M3500 Sicca syndrome, unspecified: Secondary | ICD-10-CM | POA: Diagnosis not present

## 2023-09-03 DIAGNOSIS — Z79631 Long term (current) use of antimetabolite agent: Secondary | ICD-10-CM | POA: Diagnosis not present

## 2023-09-03 DIAGNOSIS — L409 Psoriasis, unspecified: Secondary | ICD-10-CM | POA: Diagnosis not present

## 2023-09-07 DIAGNOSIS — D751 Secondary polycythemia: Secondary | ICD-10-CM | POA: Diagnosis not present

## 2023-09-07 DIAGNOSIS — E611 Iron deficiency: Secondary | ICD-10-CM | POA: Diagnosis not present

## 2023-09-09 ENCOUNTER — Encounter: Payer: Self-pay | Admitting: Family Medicine

## 2023-09-09 ENCOUNTER — Other Ambulatory Visit (HOSPITAL_BASED_OUTPATIENT_CLINIC_OR_DEPARTMENT_OTHER): Payer: Self-pay

## 2023-09-09 DIAGNOSIS — F331 Major depressive disorder, recurrent, moderate: Secondary | ICD-10-CM | POA: Diagnosis not present

## 2023-09-09 DIAGNOSIS — T50905A Adverse effect of unspecified drugs, medicaments and biological substances, initial encounter: Secondary | ICD-10-CM

## 2023-09-09 MED ORDER — METFORMIN HCL 500 MG PO TABS
1000.0000 mg | ORAL_TABLET | Freq: Two times a day (BID) | ORAL | 1 refills | Status: DC
  Filled 2023-09-09 – 2023-09-30 (×9): qty 360, 90d supply, fill #0
  Filled 2023-12-27: qty 360, 90d supply, fill #1

## 2023-09-10 ENCOUNTER — Other Ambulatory Visit: Payer: Self-pay

## 2023-09-10 ENCOUNTER — Other Ambulatory Visit (HOSPITAL_BASED_OUTPATIENT_CLINIC_OR_DEPARTMENT_OTHER): Payer: Self-pay

## 2023-09-11 ENCOUNTER — Other Ambulatory Visit (HOSPITAL_BASED_OUTPATIENT_CLINIC_OR_DEPARTMENT_OTHER): Payer: Self-pay

## 2023-09-11 ENCOUNTER — Other Ambulatory Visit: Payer: Self-pay

## 2023-09-11 ENCOUNTER — Encounter: Payer: Self-pay | Admitting: Physical Medicine & Rehabilitation

## 2023-09-14 ENCOUNTER — Encounter: Payer: Self-pay | Admitting: Neurology

## 2023-09-14 ENCOUNTER — Other Ambulatory Visit (HOSPITAL_BASED_OUTPATIENT_CLINIC_OR_DEPARTMENT_OTHER): Payer: Self-pay

## 2023-09-17 DIAGNOSIS — F331 Major depressive disorder, recurrent, moderate: Secondary | ICD-10-CM | POA: Diagnosis not present

## 2023-09-18 ENCOUNTER — Other Ambulatory Visit: Payer: Self-pay

## 2023-09-18 ENCOUNTER — Other Ambulatory Visit (HOSPITAL_BASED_OUTPATIENT_CLINIC_OR_DEPARTMENT_OTHER): Payer: Self-pay

## 2023-09-18 DIAGNOSIS — E041 Nontoxic single thyroid nodule: Secondary | ICD-10-CM | POA: Diagnosis not present

## 2023-09-18 DIAGNOSIS — R61 Generalized hyperhidrosis: Secondary | ICD-10-CM | POA: Diagnosis not present

## 2023-09-18 DIAGNOSIS — Z79890 Hormone replacement therapy (postmenopausal): Secondary | ICD-10-CM | POA: Diagnosis not present

## 2023-09-18 DIAGNOSIS — M8580 Other specified disorders of bone density and structure, unspecified site: Secondary | ICD-10-CM | POA: Diagnosis not present

## 2023-09-18 MED ORDER — FREESTYLE LANCETS MISC
2 refills | Status: DC
  Filled 2023-09-18: qty 100, 90d supply, fill #0
  Filled 2023-12-10: qty 100, 90d supply, fill #1
  Filled 2024-03-09: qty 100, 90d supply, fill #2

## 2023-09-18 MED ORDER — XYOSTED 50 MG/0.5ML ~~LOC~~ SOAJ
50.0000 mg | SUBCUTANEOUS | 5 refills | Status: DC
  Filled 2023-09-18: qty 2, 28d supply, fill #0

## 2023-09-18 MED ORDER — GLUCOSE BLOOD VI STRP
ORAL_STRIP | 2 refills | Status: AC
  Filled 2023-09-18 (×2): qty 100, 90d supply, fill #0
  Filled 2023-12-10: qty 100, 90d supply, fill #1
  Filled 2024-03-09: qty 100, 90d supply, fill #2

## 2023-09-18 MED ORDER — FREESTYLE LITE W/DEVICE KIT
PACK | 0 refills | Status: DC
  Filled 2023-09-18: qty 1, 1d supply, fill #0

## 2023-09-22 ENCOUNTER — Other Ambulatory Visit (HOSPITAL_BASED_OUTPATIENT_CLINIC_OR_DEPARTMENT_OTHER): Payer: Self-pay

## 2023-09-23 ENCOUNTER — Other Ambulatory Visit (HOSPITAL_BASED_OUTPATIENT_CLINIC_OR_DEPARTMENT_OTHER): Payer: Self-pay

## 2023-09-23 ENCOUNTER — Other Ambulatory Visit: Payer: Self-pay | Admitting: Registered Nurse

## 2023-09-23 ENCOUNTER — Encounter (HOSPITAL_BASED_OUTPATIENT_CLINIC_OR_DEPARTMENT_OTHER): Payer: Self-pay

## 2023-09-23 ENCOUNTER — Telehealth: Payer: Self-pay | Admitting: Registered Nurse

## 2023-09-23 ENCOUNTER — Other Ambulatory Visit: Payer: Self-pay

## 2023-09-23 ENCOUNTER — Other Ambulatory Visit: Payer: Self-pay | Admitting: Cardiovascular Disease

## 2023-09-23 DIAGNOSIS — M47816 Spondylosis without myelopathy or radiculopathy, lumbar region: Secondary | ICD-10-CM

## 2023-09-23 DIAGNOSIS — F331 Major depressive disorder, recurrent, moderate: Secondary | ICD-10-CM | POA: Diagnosis not present

## 2023-09-23 MED ORDER — FUROSEMIDE 80 MG PO TABS
80.0000 mg | ORAL_TABLET | Freq: Every day | ORAL | 0 refills | Status: DC
  Filled 2023-09-23: qty 90, 90d supply, fill #0

## 2023-09-23 MED ORDER — MORPHINE SULFATE ER 30 MG PO TBCR
30.0000 mg | EXTENDED_RELEASE_TABLET | Freq: Two times a day (BID) | ORAL | 0 refills | Status: DC
  Filled 2023-09-23 – 2023-09-26 (×2): qty 60, 30d supply, fill #0

## 2023-09-23 NOTE — Telephone Encounter (Signed)
 PMP was Reviewed.  MS Contin  e-scribed to pharmacy..  My Chart sent to [facility] regarding the above.

## 2023-09-24 ENCOUNTER — Other Ambulatory Visit: Payer: Self-pay

## 2023-09-24 ENCOUNTER — Other Ambulatory Visit (HOSPITAL_BASED_OUTPATIENT_CLINIC_OR_DEPARTMENT_OTHER): Payer: Self-pay

## 2023-09-24 MED ORDER — DESVENLAFAXINE SUCCINATE ER 50 MG PO TB24
50.0000 mg | ORAL_TABLET | Freq: Every day | ORAL | 2 refills | Status: DC
  Filled 2023-09-24: qty 30, 30d supply, fill #0
  Filled 2023-10-27: qty 30, 30d supply, fill #1

## 2023-09-24 MED ORDER — VILAZODONE HCL 40 MG PO TABS
40.0000 mg | ORAL_TABLET | Freq: Every day | ORAL | 2 refills | Status: DC
  Filled 2023-09-24: qty 30, 30d supply, fill #0
  Filled 2023-10-27: qty 30, 30d supply, fill #1

## 2023-09-25 ENCOUNTER — Other Ambulatory Visit (HOSPITAL_COMMUNITY): Payer: Self-pay

## 2023-09-25 ENCOUNTER — Other Ambulatory Visit: Payer: Self-pay

## 2023-09-25 MED ORDER — PREDNISONE 5 MG PO TABS
ORAL_TABLET | ORAL | 0 refills | Status: AC
  Filled 2023-09-25 – 2023-09-26 (×2): qty 50, 20d supply, fill #0

## 2023-09-25 MED ORDER — METHOTREXATE SODIUM CHEMO INJECTION 50 MG/2ML
25.0000 mg | INTRAMUSCULAR | 0 refills | Status: DC
  Filled 2023-09-29 – 2023-10-27 (×4): qty 12, 84d supply, fill #0

## 2023-09-26 ENCOUNTER — Other Ambulatory Visit (HOSPITAL_BASED_OUTPATIENT_CLINIC_OR_DEPARTMENT_OTHER): Payer: Self-pay

## 2023-09-28 ENCOUNTER — Other Ambulatory Visit: Payer: Self-pay

## 2023-09-28 ENCOUNTER — Other Ambulatory Visit (HOSPITAL_BASED_OUTPATIENT_CLINIC_OR_DEPARTMENT_OTHER): Payer: Self-pay

## 2023-09-28 ENCOUNTER — Other Ambulatory Visit (HOSPITAL_COMMUNITY): Payer: Self-pay

## 2023-09-29 ENCOUNTER — Encounter (HOSPITAL_BASED_OUTPATIENT_CLINIC_OR_DEPARTMENT_OTHER): Payer: Self-pay

## 2023-09-29 ENCOUNTER — Other Ambulatory Visit (HOSPITAL_COMMUNITY): Payer: Self-pay

## 2023-09-29 ENCOUNTER — Other Ambulatory Visit: Payer: Self-pay

## 2023-09-29 ENCOUNTER — Other Ambulatory Visit (HOSPITAL_BASED_OUTPATIENT_CLINIC_OR_DEPARTMENT_OTHER): Payer: Self-pay

## 2023-09-29 DIAGNOSIS — H608X3 Other otitis externa, bilateral: Secondary | ICD-10-CM | POA: Diagnosis not present

## 2023-09-29 MED ORDER — TESTOSTERONE CYPIONATE 200 MG/ML IM SOLN
80.0000 mg | INTRAMUSCULAR | 4 refills | Status: DC
  Filled 2023-09-29: qty 2, 35d supply, fill #0

## 2023-09-29 MED ORDER — CIPROFLOXACIN-DEXAMETHASONE 0.3-0.1 % OT SUSP
4.0000 [drp] | Freq: Two times a day (BID) | OTIC | 1 refills | Status: DC
  Filled 2023-09-29: qty 7.5, 15d supply, fill #0
  Filled 2023-10-07 – 2023-10-08 (×2): qty 7.5, 15d supply, fill #1

## 2023-09-30 ENCOUNTER — Other Ambulatory Visit (HOSPITAL_BASED_OUTPATIENT_CLINIC_OR_DEPARTMENT_OTHER): Payer: Self-pay

## 2023-09-30 DIAGNOSIS — F331 Major depressive disorder, recurrent, moderate: Secondary | ICD-10-CM | POA: Diagnosis not present

## 2023-10-02 ENCOUNTER — Other Ambulatory Visit (HOSPITAL_BASED_OUTPATIENT_CLINIC_OR_DEPARTMENT_OTHER): Payer: Self-pay

## 2023-10-05 ENCOUNTER — Ambulatory Visit: Admitting: Family Medicine

## 2023-10-05 DIAGNOSIS — E611 Iron deficiency: Secondary | ICD-10-CM | POA: Diagnosis not present

## 2023-10-05 DIAGNOSIS — D751 Secondary polycythemia: Secondary | ICD-10-CM | POA: Diagnosis not present

## 2023-10-05 DIAGNOSIS — E041 Nontoxic single thyroid nodule: Secondary | ICD-10-CM | POA: Diagnosis not present

## 2023-10-07 ENCOUNTER — Other Ambulatory Visit (HOSPITAL_BASED_OUTPATIENT_CLINIC_OR_DEPARTMENT_OTHER): Payer: Self-pay

## 2023-10-07 DIAGNOSIS — F331 Major depressive disorder, recurrent, moderate: Secondary | ICD-10-CM | POA: Diagnosis not present

## 2023-10-08 ENCOUNTER — Other Ambulatory Visit (HOSPITAL_BASED_OUTPATIENT_CLINIC_OR_DEPARTMENT_OTHER): Payer: Self-pay

## 2023-10-15 DIAGNOSIS — F331 Major depressive disorder, recurrent, moderate: Secondary | ICD-10-CM | POA: Diagnosis not present

## 2023-10-15 DIAGNOSIS — H608X3 Other otitis externa, bilateral: Secondary | ICD-10-CM | POA: Diagnosis not present

## 2023-10-16 ENCOUNTER — Other Ambulatory Visit (HOSPITAL_BASED_OUTPATIENT_CLINIC_OR_DEPARTMENT_OTHER): Payer: Self-pay

## 2023-10-16 DIAGNOSIS — F411 Generalized anxiety disorder: Secondary | ICD-10-CM | POA: Diagnosis not present

## 2023-10-16 DIAGNOSIS — F5101 Primary insomnia: Secondary | ICD-10-CM | POA: Diagnosis not present

## 2023-10-16 DIAGNOSIS — F09 Unspecified mental disorder due to known physiological condition: Secondary | ICD-10-CM | POA: Diagnosis not present

## 2023-10-16 DIAGNOSIS — F4310 Post-traumatic stress disorder, unspecified: Secondary | ICD-10-CM | POA: Diagnosis not present

## 2023-10-16 DIAGNOSIS — F32A Depression, unspecified: Secondary | ICD-10-CM | POA: Diagnosis not present

## 2023-10-16 MED ORDER — DESVENLAFAXINE SUCCINATE ER 50 MG PO TB24: 50.0000 mg | ORAL_TABLET | Freq: Every day | ORAL | 2 refills | Status: DC

## 2023-10-16 MED ORDER — HYDROXYZINE HCL 25 MG PO TABS
ORAL_TABLET | ORAL | 2 refills | Status: DC
  Filled 2023-10-17: qty 150, 30d supply, fill #0

## 2023-10-16 MED ORDER — VILAZODONE HCL 40 MG PO TABS: 40.0000 mg | ORAL_TABLET | Freq: Every day | ORAL | 2 refills | Status: DC

## 2023-10-17 ENCOUNTER — Other Ambulatory Visit (HOSPITAL_BASED_OUTPATIENT_CLINIC_OR_DEPARTMENT_OTHER): Payer: Self-pay

## 2023-10-19 ENCOUNTER — Other Ambulatory Visit (HOSPITAL_COMMUNITY): Payer: Self-pay

## 2023-10-20 DIAGNOSIS — F331 Major depressive disorder, recurrent, moderate: Secondary | ICD-10-CM | POA: Diagnosis not present

## 2023-10-23 NOTE — Progress Notes (Signed)
 Subjective:    Patient ID: Jesse Bowers, adult    DOB: 14-Oct-1965, 58 y.o.   MRN: [id number]  HPI: Jesse Bowers is a 58 y.o. male who returns for follow up appointment for chronic pain and medication refill. He states his pain is located in his neck, lower back, right foot with tingling and burning and left foot pain. He rates his pain 9. His current exercise regime is walking and performing stretching exercises.  He also reports last week he was the beach, walking in the sand lost balance fell on his left side. He reports son helped him up, he didn't seek medical attention.   He also states his son gave him a gummy , he didn't realize it was CBD product, he was educated on the narcotic policy.   Jesse Bowers  Morphine  equivalent is 90.00 MME.   Last Oral Swab was Performed on 08/10/2023, it was consistent.     Pain Inventory Average Pain 6 Pain Right Now 9 My pain is constant, sharp, stabbing, tingling, and aching  In the last 24 hours, has pain interfered with the following? General activity 9 Relation with others 9 Enjoyment of life 9 What TIME of day is your pain at its worst? daytime and night Sleep (in general) Poor  Pain is worse with: walking, bending, standing, and some activites Pain improves with: rest, heat/ice, medication, TENS, and injections Relief from Meds: good  Family History  Problem Relation Age of Onset   Stroke Maternal Grandfather        68   Heart attack Maternal Grandfather    Glaucoma Maternal Grandfather    Macular degeneration Maternal Grandfather    Breast cancer Sister    Hypertension Mother    Psoriasis Mother    Other Mother        meningioma developed ~2019   Glaucoma Mother    Cancer Paternal Grandfather    Heart attack Paternal Grandfather    Stroke Paternal Uncle        age 93   Polycythemia Paternal Uncle    Stroke Maternal Grandmother    Congestive Heart Failure Maternal Grandmother    Heart attack Maternal  Grandmother    Protein C deficiency Sister 48       Miscarriages   Breast cancer Maternal Aunt 35   Social History   Socioeconomic History   Marital status: Married    Spouse name: Not on file   Number of children: 2   Years of education: 4y college   Highest education level: Master's degree (e.g., MA, MS, MEng, MEd, MSW, MBA)  Occupational History   Occupation: Restaurant manager, fast food    Comment: Not working since CVA 2015  Tobacco Use   Smoking status: Never   Smokeless tobacco: Never  Vaping Use   Vaping status: Never Used  Substance and Sexual Activity   Alcohol  use: Yes    Comment: social   Drug use: No   Sexual activity: Yes    Birth control/protection: None    Comment: patient is a transgender on testosterone  shots, no biological kids  Other Topics Concern   Not on file  Social History Narrative   Not on file   Social Drivers of Health   Financial Resource Strain: Low Risk  (03/12/2023)   Overall Financial Resource Strain (CARDIA)    Difficulty of Paying Living Expenses: Not very hard  Recent Concern: Financial Resource Strain - High Risk (12/12/2022)   Overall Financial Resource Strain (CARDIA)  Difficulty of Paying Living Expenses: Hard  Food Insecurity: Low Risk  (09/18/2023)   Received from [facility]   Hunger Vital Sign    Within the past 12 months, you worried that your food would run out before you got money to buy more: Never true    Within the past 12 months, the food you bought just didn't last and you didn't have money to get more. : Never true  Transportation Needs: No Transportation Needs (09/18/2023)   [facility]   Transportation    In the past 12 months, has lack of reliable transportation kept you from medical appointments, meetings, work or from getting things needed for daily living? : No  Physical Activity: Unknown (03/12/2023)   Exercise Vital Sign    Days of Exercise per Week: 0 days    Minutes of Exercise per  Session: Not on file  Stress: Stress Concern Present (03/12/2023)   [employer] of Occupational Health - Occupational Stress Questionnaire    Feeling of Stress : Rather much  Social Connections: Socially Isolated (03/12/2023)   Social Connection and Isolation Panel    Frequency of Communication with Friends and Family: Never    Frequency of Social Gatherings with Friends and Family: Never    Attends Religious Services: Never    Database administrator or Organizations: No    Attends Engineer, structural: Not on file    Marital Status: Married   Past Surgical History:  Procedure Laterality Date   ABDOMINAL HYSTERECTOMY Bilateral 1994   TAH, BSO- tranverse incision at 58 yo   ANKLE ARTHROSCOPY WITH RECONSTRUCTION Right 2007   CHOLECYSTECTOMY     laparoscopic   COLONOSCOPY     x3   EYE SURGERY     Left eye 03/02/2018, right 02/15/2018   HEMORROIDECTOMY  08/19/2020   HIP ARTHROSCOPY W/ LABRAL REPAIR Right 05/11/2013   acetabular labral tear 03/30/2013   KNEE ARTHROPLASTY Right    KNEE JOINT MANIPULATION Left    x3 under anesthesia   KNEE SURGERY Bilateral 1984   Right ACL, left PCL repair   LITHOTRIPSY  2005   LIVER BIOPSY  2013   normal results.   MASTECTOMY Bilateral    prior to 2009   MOUTH SURGERY     NASAL SEPTUM SURGERY N/A 09/20/2015   by ENT Jesse Bowers   OVARIAN CYST SURGERY Left    size of grapefruit, was informed that she had shortened vagina   SHOULDER SURGERY Bilateral    Right 08/15/2016, Left 11/15/2016   THUMB ARTHROSCOPY Left    THYROIDECTOMY, PARTIAL Left 2008   TOTAL KNEE ARTHROPLASTY Right 08/23/2018   Procedure: TOTAL KNEE ARTHROPLASTY;  Surgeon: Jesse Lerner, MD;  Location: [facility];  Service: Orthopedics;  Laterality: Right;    TOTAL KNEE REVISION Left 02/06/2016   Procedure: LEFT TOTAL KNEE REVISION;  Surgeon: Bowers Melodi, MD;  Location: [facility];  Service: Orthopedics;  Laterality: Left;   TOTAL KNEE REVISION Left 04/22/2017    Procedure: Left knee polyethylene revision;  Surgeon: Jesse Lerner, MD;  Location: [facility];  Service: Orthopedics;  Laterality: Left;   UPPER GI ENDOSCOPY  2003   Past Surgical History:  Procedure Laterality Date   ABDOMINAL HYSTERECTOMY Bilateral 1994   TAH, BSO- tranverse incision at 58 yo   ANKLE ARTHROSCOPY WITH RECONSTRUCTION Right 2007   CHOLECYSTECTOMY     laparoscopic   COLONOSCOPY     x3   EYE SURGERY  Left eye 03/02/2018, right 02/15/2018   HEMORROIDECTOMY  08/19/2020   HIP ARTHROSCOPY W/ LABRAL REPAIR Right 05/11/2013   acetabular labral tear 03/30/2013   KNEE ARTHROPLASTY Right    KNEE JOINT MANIPULATION Left    x3 under anesthesia   KNEE SURGERY Bilateral 1984   Right ACL, left PCL repair   LITHOTRIPSY  2005   LIVER BIOPSY  2013   normal results.   MASTECTOMY Bilateral    prior to 2009   MOUTH SURGERY     NASAL SEPTUM SURGERY N/A 09/20/2015   by ENT Jesse Bowers   OVARIAN CYST SURGERY Left    size of grapefruit, was informed that she had shortened vagina   SHOULDER SURGERY Bilateral    Right 08/15/2016, Left 11/15/2016   THUMB ARTHROSCOPY Left    THYROIDECTOMY, PARTIAL Left 2008   TOTAL KNEE ARTHROPLASTY Right 08/23/2018   Procedure: TOTAL KNEE ARTHROPLASTY;  Surgeon: Jesse Lerner, MD;  Location: [facility];  Service: Orthopedics;  Laterality: Right;    TOTAL KNEE REVISION Left 02/06/2016   Procedure: LEFT TOTAL KNEE REVISION;  Surgeon: Bowers Melodi, MD;  Location: [facility];  Service: Orthopedics;  Laterality: Left;   TOTAL KNEE REVISION Left 04/22/2017   Procedure: Left knee polyethylene revision;  Surgeon: Jesse Lerner, MD;  Location: [facility];  Service: Orthopedics;  Laterality: Left;   UPPER GI ENDOSCOPY  2003   Past Medical History:  Diagnosis Date   Abnormal weight loss    Anxiety    Arthritis    Cataract    OU   Celiac disease    Cervical neck pain with evidence of disc disease    patient has a cyst    Chronic constipation    Chronic  diastolic heart failure (HCC)    Pt. 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   Family history of adverse reaction to anesthesia    family has problems with anesthesia of nausea and vomiting    GERD (gastroesophageal reflux disease)    History of in 20's   Gluten enteropathy    H/O parotitis    right    Hard of hearing    History of kidney stones    History of retinal tear    Bilateral   History of staph infection    required wound vac   Hx-TIA (transient ischemic attack)    2015   Kidney stones 08/2020   LVH (left ventricular hypertrophy) 12/15/2016   Mild, noted on ECHO   MVP (mitral valve prolapse)    NAFL (nonalcoholic fatty liver)    Neck pain    Neuromuscular disorder (HCC)    bilateral neuropathy feet.   Nuclear sclerotic cataract of both eyes 09/08/2019   Pneumonia 12/17/2010   Polycythemia    Polycythemia, secondary    PONV (postoperative nausea and vomiting)    Protein C deficiency (HCC)    Dr. Lanny Abide   Psoriasis    16 X10 cm psoriatic rash on sole of left foot ; open and occ scant bleeding;    psoriatic arthritis    PTSD (post-traumatic stress disorder)    Scaphoid fracture of wrist 09/23/2013   Seizure (HCC)    childhood, medication until age  10 then weanned completely off   Sleep apnea    split night study last done by Dr. Vear 06/18/15 shows severe OSA, CSA, and hypersomnia, rec bipap   Splenomegaly    Stenosis of ureteropelvic junction (UPJ)    left   Stroke [facility])    CVA vs TIA in left cerebrum causing slight right sided weakness-Dr. Vear follows   Syrinx of spinal cord (HCC) 01/06/2014   c spine on MRI   Tachycardia    hx of    Transfusion history    past history- none recent, after surgeries due to blood loss    Transgender with history of gender affirmation surgery    Wears glasses    Wears hearing aid    There were no vitals taken for this visit.  Opioid Risk Score:   Fall Risk Score:  `1  Depression screen PHQ 2/9     08/10/2023    1:12 PM 04/14/2023    9:23 AM 01/16/2023    2:31 PM 12/16/2022   12:59 PM 12/10/2022   10:48 AM 08/06/2022    3:14 PM 07/07/2022    1:32 PM  Depression screen PHQ 2/9  Decreased Interest 0 1 1 1  0 0 1  Down, Depressed, Hopeless 0 1 2 1  0 0 1  PHQ - 2 Score 0 2 3 2  0 0 2  Altered sleeping  3 3 1   1   Tired, decreased energy  1 1 1      Change in appetite  0 0 1   0  Feeling bad or failure about yourself   0 3 0   0  Trouble concentrating  1 3 1   2   Moving slowly or fidgety/restless  0 0 0   0  Suicidal thoughts  0 0 0   0  PHQ-9 Score  7 13 6   5   Difficult doing work/chores  Somewhat difficult Very difficult Somewhat difficult   Somewhat difficult    Review of Systems  Musculoskeletal:  Positive for back pain and gait problem.       Pain in both shoulders, both knees, both feet, left lower leg, hands & elbows  Neurological:  Positive for headaches.  All other systems reviewed and are negative.      Objective:   Physical Exam Vitals and nursing note reviewed.  Constitutional:      Appearance: Normal appearance.  Neck:     Comments: Cervical Paraspinal Tenderness: C-5-C-6 Decreased ROM with Cervical Right and Left Rotation 30 Degrees  Cardiovascular:     Rate and Rhythm: Normal rate and regular rhythm.     Pulses: Normal pulses.     Heart sounds: Normal heart sounds.  Pulmonary:     Effort: Pulmonary effort is normal.     Breath sounds: Normal breath sounds.  Musculoskeletal:     Comments: Normal Muscle Bulk and Muscle Testing Reveals:  Upper Extremities: Full ROM and Muscle Strength 5/5 Lumbar Hypersensitivity Lower Extremities: Full ROM and Muscle Strength 5/5 Arises from Table Slowly  Antalgic  Gait     Neurological:     Mental Status:  He is alert and oriented to person, place, and time.  Psychiatric:        Mood and Affect: Mood normal.        Behavior: Behavior normal.          Assessment & Plan:  1, Cervicalgia: RX: Cervical X-ray, Continue to monitor 2.. Psoriatic arthritis with pain in multiple areas,  most prominently feet, hands, elbows. Refilled: MS Contin  30 mg one every 12 hours #60, and Continue  Oxycodone  10 mg one tablet every 8 hours as needed  10/26/2023 We will continue the opioid monitoring program, this consists of regular clinic visits, examinations, urine drug screen, pill counts as well as use of [place]  Controlled Substance Reporting system. A 12 month History has been reviewed on the [place]  Controlled Substance Reporting System 06/06/2022. Rheumatology Following.  3. Chronic Pain Syndrome: Continue Compound Cream. 10/26/2022 4. 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 10/26/2023 5. 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 10/26/2023 S/P TKR on 07/03/14:  Ortho Following: Dr. Melodi perform Total Left Knee Revision on 02/06/2016. S/P Left Knee Revision on 04/22/17. S/P  Right TKA on 08/23/2018 with Dr. Melodi. 6. Chronic mid- low back pain: Continue current medication regime, and Continue Physical therapy. Continue to Monitor. SABRA 10/26/2023 7. Polycythemia: Oncology Following. 10/26/2023 8. Depression with anxiety : Psychiatry following.Continue to Monitor. 10/26/2022 9. Cervicalgia:  Continue HEP as tolerated  and Continue to Monitor. Continue current medication regime. 10/26/2023 10.. Muscle Spasticity: Continue Baclofen  10/26/2023 11. Right Ankle Pain: No complaints Today. Continue to Monitor. 10/26/2023 12. Left Foot Pain: RX: X-ray. Continue to Monitor. 10/26/2023 13. Left lateral epicondylitis:Ortho Following.  10/26/2023 11. Bilateral Shoulder Pain: Continue HEP as Tolerated, Continue to Monitor. Ortho Following.10/26/2023 12. Lumbar Radiculitis: S/P  on 12/21/2020 Lumbar epidural steroid injection under fluoroscopic guidance       We will continue to Monitor. 06/06/2022  13.Left Greater Trochanteric Bursitis: Left Hip Pain: No complaints today. Continue with Ice and Heat Therapy: Contiue HEP as Tolerated. Continue to Monitor. 10/26/2023     F/U in 2 months

## 2023-10-26 ENCOUNTER — Other Ambulatory Visit: Payer: Self-pay

## 2023-10-26 ENCOUNTER — Encounter: Payer: Self-pay | Admitting: Registered Nurse

## 2023-10-26 ENCOUNTER — Other Ambulatory Visit (HOSPITAL_BASED_OUTPATIENT_CLINIC_OR_DEPARTMENT_OTHER): Payer: Self-pay

## 2023-10-26 ENCOUNTER — Encounter: Attending: Physical Medicine & Rehabilitation | Admitting: Registered Nurse

## 2023-10-26 VITALS — BP 116/84 | HR 95 | Ht 67.0 in | Wt 213.0 lb

## 2023-10-26 DIAGNOSIS — Z79891 Long term (current) use of opiate analgesic: Secondary | ICD-10-CM

## 2023-10-26 DIAGNOSIS — G2581 Restless legs syndrome: Secondary | ICD-10-CM | POA: Diagnosis not present

## 2023-10-26 DIAGNOSIS — M79672 Pain in left foot: Secondary | ICD-10-CM | POA: Diagnosis not present

## 2023-10-26 DIAGNOSIS — L4050 Arthropathic psoriasis, unspecified: Secondary | ICD-10-CM

## 2023-10-26 DIAGNOSIS — G609 Hereditary and idiopathic neuropathy, unspecified: Secondary | ICD-10-CM

## 2023-10-26 DIAGNOSIS — D751 Secondary polycythemia: Secondary | ICD-10-CM | POA: Diagnosis not present

## 2023-10-26 DIAGNOSIS — E611 Iron deficiency: Secondary | ICD-10-CM | POA: Diagnosis not present

## 2023-10-26 DIAGNOSIS — M47816 Spondylosis without myelopathy or radiculopathy, lumbar region: Secondary | ICD-10-CM

## 2023-10-26 DIAGNOSIS — M542 Cervicalgia: Secondary | ICD-10-CM | POA: Diagnosis not present

## 2023-10-26 DIAGNOSIS — G894 Chronic pain syndrome: Secondary | ICD-10-CM

## 2023-10-26 DIAGNOSIS — Z5181 Encounter for therapeutic drug level monitoring: Secondary | ICD-10-CM | POA: Diagnosis not present

## 2023-10-26 DIAGNOSIS — R748 Abnormal levels of other serum enzymes: Secondary | ICD-10-CM | POA: Diagnosis not present

## 2023-10-26 MED ORDER — OXYCODONE HCL 10 MG PO TABS
10.0000 mg | ORAL_TABLET | Freq: Three times a day (TID) | ORAL | 0 refills | Status: DC | PRN
  Filled 2023-10-26: qty 90, 30d supply, fill #0

## 2023-10-26 MED ORDER — MORPHINE SULFATE ER 30 MG PO TBCR
30.0000 mg | EXTENDED_RELEASE_TABLET | Freq: Two times a day (BID) | ORAL | 0 refills | Status: DC
  Filled 2023-10-26: qty 60, 30d supply, fill #0

## 2023-10-26 NOTE — Patient Instructions (Signed)
 Send a My- Chart message when you pick up your Morphine .

## 2023-10-27 ENCOUNTER — Other Ambulatory Visit (HOSPITAL_BASED_OUTPATIENT_CLINIC_OR_DEPARTMENT_OTHER): Payer: Self-pay

## 2023-10-27 ENCOUNTER — Other Ambulatory Visit: Payer: Self-pay

## 2023-10-27 DIAGNOSIS — Z79890 Hormone replacement therapy (postmenopausal): Secondary | ICD-10-CM | POA: Diagnosis not present

## 2023-10-27 DIAGNOSIS — Z4889 Encounter for other specified surgical aftercare: Secondary | ICD-10-CM | POA: Diagnosis not present

## 2023-10-27 DIAGNOSIS — M25531 Pain in right wrist: Secondary | ICD-10-CM | POA: Diagnosis not present

## 2023-10-27 MED ORDER — HYDROCORTISONE-ACETIC ACID 1-2 % OT SOLN
OTIC | 2 refills | Status: DC
  Filled 2023-10-27: qty 10, 10d supply, fill #0
  Filled 2023-11-02: qty 10, 10d supply, fill #1

## 2023-10-28 ENCOUNTER — Other Ambulatory Visit (HOSPITAL_BASED_OUTPATIENT_CLINIC_OR_DEPARTMENT_OTHER): Payer: Self-pay

## 2023-10-28 ENCOUNTER — Ambulatory Visit
Admission: RE | Admit: 2023-10-28 | Discharge: 2023-10-28 | Disposition: A | Discharge status: Home / Self Care | Source: Ambulatory Visit | Attending: Registered Nurse | Admitting: Registered Nurse

## 2023-10-28 DIAGNOSIS — F331 Major depressive disorder, recurrent, moderate: Secondary | ICD-10-CM | POA: Diagnosis not present

## 2023-10-28 DIAGNOSIS — M50323 Other cervical disc degeneration at C6-C7 level: Secondary | ICD-10-CM | POA: Diagnosis not present

## 2023-10-28 DIAGNOSIS — M79672 Pain in left foot: Secondary | ICD-10-CM | POA: Diagnosis not present

## 2023-10-29 ENCOUNTER — Telehealth: Payer: Self-pay | Admitting: Registered Nurse

## 2023-10-29 ENCOUNTER — Other Ambulatory Visit (HOSPITAL_BASED_OUTPATIENT_CLINIC_OR_DEPARTMENT_OTHER): Payer: Self-pay

## 2023-10-29 DIAGNOSIS — M47816 Spondylosis without myelopathy or radiculopathy, lumbar region: Secondary | ICD-10-CM

## 2023-10-29 MED ORDER — MORPHINE SULFATE ER 30 MG PO TBCR
30.0000 mg | EXTENDED_RELEASE_TABLET | Freq: Two times a day (BID) | ORAL | 0 refills | Status: DC
  Filled 2023-10-29 – 2023-11-27 (×2): qty 60, 30d supply, fill #0

## 2023-10-29 NOTE — Telephone Encounter (Signed)
 PMP was Reviewed,  MS Contin  e-scribed today, for September.  Nollie is aware.

## 2023-10-30 ENCOUNTER — Other Ambulatory Visit: Payer: Self-pay

## 2023-10-30 ENCOUNTER — Other Ambulatory Visit (HOSPITAL_BASED_OUTPATIENT_CLINIC_OR_DEPARTMENT_OTHER): Payer: Self-pay

## 2023-10-30 MED ORDER — DESVENLAFAXINE SUCCINATE ER 50 MG PO TB24
50.0000 mg | ORAL_TABLET | Freq: Every day | ORAL | 1 refills | Status: DC
  Filled 2023-11-03 – 2023-11-20 (×2): qty 90, 90d supply, fill #0

## 2023-10-30 MED ORDER — HYDROXYZINE HCL 25 MG PO TABS
ORAL_TABLET | ORAL | 1 refills | Status: AC
  Filled 2023-10-31 – 2023-11-10 (×3): qty 450, 90d supply, fill #0
  Filled 2024-01-18: qty 450, 90d supply, fill #1
  Filled 2024-02-02: qty 450, 90d supply, fill #0
  Filled 2024-02-02: qty 450, 90d supply, fill #1

## 2023-10-30 MED ORDER — VILAZODONE HCL 40 MG PO TABS
40.0000 mg | ORAL_TABLET | Freq: Every day | ORAL | 1 refills | Status: AC
  Filled 2023-11-20 – 2023-11-30 (×2): qty 90, 90d supply, fill #0
  Filled 2024-01-18 – 2024-02-23 (×3): qty 90, 90d supply, fill #1

## 2023-10-30 MED ORDER — TESTOSTERONE CYPIONATE 200 MG/ML IM SOLN
100.0000 mg | INTRAMUSCULAR | 4 refills | Status: AC
  Filled 2023-10-30: qty 12, 84d supply, fill #0
  Filled 2024-01-09 – 2024-01-27 (×2): qty 12, 84d supply, fill #1

## 2023-10-30 MED ORDER — TUBERCULIN SYRINGE 27G X 1/2" 1 ML MISC
3 refills | Status: AC
  Filled 2023-10-30: qty 25, 175d supply, fill #0

## 2023-10-31 ENCOUNTER — Other Ambulatory Visit (HOSPITAL_BASED_OUTPATIENT_CLINIC_OR_DEPARTMENT_OTHER): Payer: Self-pay

## 2023-10-31 ENCOUNTER — Encounter (HOSPITAL_BASED_OUTPATIENT_CLINIC_OR_DEPARTMENT_OTHER): Payer: Self-pay

## 2023-11-02 ENCOUNTER — Other Ambulatory Visit (HOSPITAL_BASED_OUTPATIENT_CLINIC_OR_DEPARTMENT_OTHER): Payer: Self-pay | Admitting: Cardiovascular Disease

## 2023-11-02 ENCOUNTER — Other Ambulatory Visit (HOSPITAL_BASED_OUTPATIENT_CLINIC_OR_DEPARTMENT_OTHER): Payer: Self-pay

## 2023-11-03 ENCOUNTER — Other Ambulatory Visit (HOSPITAL_BASED_OUTPATIENT_CLINIC_OR_DEPARTMENT_OTHER): Payer: Self-pay

## 2023-11-03 ENCOUNTER — Telehealth: Payer: Self-pay | Admitting: Registered Nurse

## 2023-11-03 DIAGNOSIS — M542 Cervicalgia: Secondary | ICD-10-CM

## 2023-11-03 MED ORDER — ROPINIROLE HCL 0.25 MG PO TABS
0.2500 mg | ORAL_TABLET | Freq: Every day | ORAL | 1 refills | Status: DC
  Filled 2023-11-03: qty 30, 30d supply, fill #0
  Filled 2023-11-30: qty 30, 30d supply, fill #1

## 2023-11-03 NOTE — Telephone Encounter (Signed)
 Physical Therapy referral placed today.  Requip  prescription sent to pharmacy , spoke with Dr Babs regarding the Requip .  Jesus is aware of the above via My-Chart

## 2023-11-04 ENCOUNTER — Other Ambulatory Visit (HOSPITAL_BASED_OUTPATIENT_CLINIC_OR_DEPARTMENT_OTHER): Payer: Self-pay

## 2023-11-04 ENCOUNTER — Other Ambulatory Visit: Payer: Self-pay

## 2023-11-04 DIAGNOSIS — F331 Major depressive disorder, recurrent, moderate: Secondary | ICD-10-CM | POA: Diagnosis not present

## 2023-11-04 MED ORDER — POTASSIUM CHLORIDE ER 10 MEQ PO TBCR
20.0000 meq | EXTENDED_RELEASE_TABLET | Freq: Every day | ORAL | 0 refills | Status: DC
  Filled 2023-11-04: qty 180, 90d supply, fill #0

## 2023-11-05 ENCOUNTER — Other Ambulatory Visit: Payer: Self-pay | Admitting: Pharmacy Technician

## 2023-11-05 ENCOUNTER — Other Ambulatory Visit: Payer: Self-pay

## 2023-11-05 ENCOUNTER — Encounter (INDEPENDENT_AMBULATORY_CARE_PROVIDER_SITE_OTHER): Payer: Self-pay

## 2023-11-05 ENCOUNTER — Encounter (HOSPITAL_BASED_OUTPATIENT_CLINIC_OR_DEPARTMENT_OTHER): Payer: Self-pay

## 2023-11-05 NOTE — Progress Notes (Signed)
 Specialty Pharmacy Refill Coordination Note  Jesse Bowers is a 58 y.o. adult contacted today regarding refills of specialty medication(s) Guselkumab  (Tremfya  One-Press)   Patient requested (Patient-Rptd) Pickup at [facility] date: (Patient-Rptd) 11/10/23   Medication will be filled on 11/09/23.

## 2023-11-06 ENCOUNTER — Other Ambulatory Visit (HOSPITAL_BASED_OUTPATIENT_CLINIC_OR_DEPARTMENT_OTHER): Payer: Self-pay

## 2023-11-06 DIAGNOSIS — Z1211 Encounter for screening for malignant neoplasm of colon: Secondary | ICD-10-CM | POA: Diagnosis not present

## 2023-11-06 DIAGNOSIS — R197 Diarrhea, unspecified: Secondary | ICD-10-CM | POA: Diagnosis not present

## 2023-11-06 DIAGNOSIS — Z800 Family history of malignant neoplasm of digestive organs: Secondary | ICD-10-CM | POA: Diagnosis not present

## 2023-11-06 DIAGNOSIS — Z8673 Personal history of transient ischemic attack (TIA), and cerebral infarction without residual deficits: Secondary | ICD-10-CM | POA: Diagnosis not present

## 2023-11-06 DIAGNOSIS — D509 Iron deficiency anemia, unspecified: Secondary | ICD-10-CM | POA: Diagnosis not present

## 2023-11-06 DIAGNOSIS — I509 Heart failure, unspecified: Secondary | ICD-10-CM | POA: Diagnosis not present

## 2023-11-06 DIAGNOSIS — K900 Celiac disease: Secondary | ICD-10-CM | POA: Diagnosis not present

## 2023-11-06 DIAGNOSIS — Z86718 Personal history of other venous thrombosis and embolism: Secondary | ICD-10-CM | POA: Diagnosis not present

## 2023-11-06 DIAGNOSIS — Z860100 Personal history of colon polyps, unspecified: Secondary | ICD-10-CM | POA: Diagnosis not present

## 2023-11-06 DIAGNOSIS — I110 Hypertensive heart disease with heart failure: Secondary | ICD-10-CM | POA: Diagnosis not present

## 2023-11-06 DIAGNOSIS — K648 Other hemorrhoids: Secondary | ICD-10-CM | POA: Diagnosis not present

## 2023-11-06 DIAGNOSIS — I2510 Atherosclerotic heart disease of native coronary artery without angina pectoris: Secondary | ICD-10-CM | POA: Diagnosis not present

## 2023-11-06 DIAGNOSIS — R790 Abnormal level of blood mineral: Secondary | ICD-10-CM | POA: Diagnosis not present

## 2023-11-09 ENCOUNTER — Other Ambulatory Visit: Payer: Self-pay

## 2023-11-09 DIAGNOSIS — F649 Gender identity disorder, unspecified: Secondary | ICD-10-CM | POA: Diagnosis not present

## 2023-11-10 ENCOUNTER — Other Ambulatory Visit (HOSPITAL_BASED_OUTPATIENT_CLINIC_OR_DEPARTMENT_OTHER): Payer: Self-pay

## 2023-11-10 DIAGNOSIS — H608X3 Other otitis externa, bilateral: Secondary | ICD-10-CM | POA: Diagnosis not present

## 2023-11-10 DIAGNOSIS — H6122 Impacted cerumen, left ear: Secondary | ICD-10-CM | POA: Diagnosis not present

## 2023-11-10 DIAGNOSIS — F331 Major depressive disorder, recurrent, moderate: Secondary | ICD-10-CM | POA: Diagnosis not present

## 2023-11-11 DIAGNOSIS — Z23 Encounter for immunization: Secondary | ICD-10-CM | POA: Diagnosis not present

## 2023-11-11 DIAGNOSIS — K760 Fatty (change of) liver, not elsewhere classified: Secondary | ICD-10-CM | POA: Diagnosis not present

## 2023-11-11 DIAGNOSIS — R197 Diarrhea, unspecified: Secondary | ICD-10-CM | POA: Diagnosis not present

## 2023-11-11 DIAGNOSIS — K900 Celiac disease: Secondary | ICD-10-CM | POA: Diagnosis not present

## 2023-11-12 ENCOUNTER — Other Ambulatory Visit (HOSPITAL_BASED_OUTPATIENT_CLINIC_OR_DEPARTMENT_OTHER): Payer: Self-pay

## 2023-11-12 ENCOUNTER — Other Ambulatory Visit: Payer: Self-pay

## 2023-11-12 ENCOUNTER — Other Ambulatory Visit: Payer: Self-pay | Admitting: Family Medicine

## 2023-11-12 DIAGNOSIS — L649 Androgenic alopecia, unspecified: Secondary | ICD-10-CM

## 2023-11-12 MED ORDER — FINASTERIDE 1 MG PO TABS
1.0000 mg | ORAL_TABLET | Freq: Every day | ORAL | 1 refills | Status: DC
  Filled 2023-11-12: qty 90, 90d supply, fill #0
  Filled 2024-02-02: qty 90, 90d supply, fill #1

## 2023-11-13 ENCOUNTER — Other Ambulatory Visit (HOSPITAL_BASED_OUTPATIENT_CLINIC_OR_DEPARTMENT_OTHER): Payer: Self-pay

## 2023-11-13 ENCOUNTER — Encounter: Payer: Self-pay | Admitting: Cardiovascular Disease

## 2023-11-13 ENCOUNTER — Ambulatory Visit: Attending: Cardiovascular Disease | Admitting: Cardiovascular Disease

## 2023-11-13 ENCOUNTER — Ambulatory Visit

## 2023-11-13 ENCOUNTER — Other Ambulatory Visit: Payer: Self-pay

## 2023-11-13 VITALS — BP 112/68 | HR 86 | Ht 68.0 in | Wt 217.0 lb

## 2023-11-13 DIAGNOSIS — R002 Palpitations: Secondary | ICD-10-CM

## 2023-11-13 DIAGNOSIS — I5032 Chronic diastolic (congestive) heart failure: Secondary | ICD-10-CM | POA: Diagnosis not present

## 2023-11-13 DIAGNOSIS — Z8669 Personal history of other diseases of the nervous system and sense organs: Secondary | ICD-10-CM

## 2023-11-13 DIAGNOSIS — D6859 Other primary thrombophilia: Secondary | ICD-10-CM

## 2023-11-13 MED ORDER — RABEPRAZOLE SODIUM 20 MG PO TBEC
DELAYED_RELEASE_TABLET | ORAL | 1 refills | Status: DC
  Filled 2023-11-13: qty 180, 90d supply, fill #0
  Filled 2024-01-18 – 2024-02-20 (×3): qty 180, 90d supply, fill #1

## 2023-11-13 NOTE — Progress Notes (Addendum)
 Cardiology Office Note:    Date:  11/13/2023   ID:  Jesse Bowers, DOB 1965-12-14, MRN [id number]  PCP:  Ozell Heron HERO, MD  Cardiologist:  Jerel Balding, MD   Referring MD: Ozell Heron HERO, MD   No chief complaint on file.    History of Present Illness:    Jesse Bowers is a 58 y.o. male with a hx of chronic diastolic heart failure, obstructive sleep apnea/central sleep apnea, history of previous stroke on chronic anticoagulation with Xarelto  (protein C deficiency) returning for routine follow-up.   Today, he is walking without his scooter or any support devices.  He uses a cane but this hurts his wrist.  He has had occasional falls due to poor balance, but does not like using a walker.  Recently he has noticed more frequent palpitations and has been taking his metoprolol  on a daily basis.  He has recorded a rhythm strips with his smart watch but these all showed normal sinus rhythm or very mild sinus tachycardia.  He has not had any dizzy spells or syncope.  He denies orthopnea, PND or lower extremity edema (as long as he takes his occasional diuretic).  He does feel a little short of breath when he has the palpitations.  He has not had any chest pain during exercise (he does what he refers to as a pretty intense yoga exercise regimen), but does have some chest discomfort following exercise.  He has not had any bleeding events.  He has not had any falls with head impact.  Most recent lipid profile is quite favorable with an LDL of 89 (although this has increased from the previous value of 60), and with a significant improvement in the HDL level which is now up to 52.  Triglycerides are normal.  He has normal renal function.  His last echo was 04/27/2020. He has preserved left ventricular systolic function (EF>60%, GLS -24%), but with some echo evidence of diastolic dysfunction (mildly decreased e' for age, E/A 0.7).   He has complex obstructive and central sleep apnea,  with mild daytime hypersomnolence, has a REMEDE device implanted via the SVC since December 2024. On MTX and has tried a couple of biologicals for psoriatic arthritis currently taking Rinvoq.  He underwent gender affirmation surgery in December 2022, on chronic androgen therapy (transition began around the age of 64).  His daughter graduated with an Medical laboratory scientific officer and works for [employer] in [employer] . His son is an ED nurse in [place].  Past Medical History:  Diagnosis Date   Abnormal weight loss    Anxiety    Arthritis    Cataract    OU   Celiac disease    Cervical neck pain with evidence of disc disease    patient has a cyst    Chronic constipation    Chronic diastolic heart failure (HCC)    Pt. 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   Family history of adverse reaction to anesthesia    family has problems with anesthesia of nausea and vomiting    GERD (gastroesophageal reflux disease)    History of in 20's   Gluten enteropathy    H/O parotitis    right  Hard of hearing    History of kidney stones    History of retinal tear    Bilateral   History of staph infection    required wound vac   Hx-TIA (transient ischemic attack)    2015   Kidney stones 08/2020   LVH (left ventricular hypertrophy) 12/15/2016   Mild, noted on ECHO   MVP (mitral valve prolapse)    NAFL (nonalcoholic fatty liver)    Neck pain    Neuromuscular disorder (HCC)    bilateral neuropathy feet.   Nuclear sclerotic cataract of both eyes 09/08/2019   Pneumonia 12/17/2010   Polycythemia    Polycythemia, secondary    PONV (postoperative nausea and vomiting)    Protein C deficiency (HCC)    Dr. Lanny Abide   Psoriasis    16 X10 cm psoriatic rash on sole of left foot ; open and occ scant  bleeding;    psoriatic arthritis    PTSD (post-traumatic stress disorder)    Scaphoid fracture of wrist 09/23/2013   Seizure (HCC)    childhood, medication until age 14 then weanned completely off   Sleep apnea    split night study last done by Dr. Vear 06/18/15 shows severe OSA, CSA, and hypersomnia, rec bipap   Splenomegaly    Stenosis of ureteropelvic junction (UPJ)    left   Stroke [facility])    CVA vs TIA in left cerebrum causing slight right sided weakness-Dr. 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    Wears glasses    Wears hearing aid     Past Surgical History:  Procedure Laterality Date   ABDOMINAL HYSTERECTOMY Bilateral 1994   TAH, BSO- tranverse incision at 58 yo   ANKLE ARTHROSCOPY WITH RECONSTRUCTION Right 2007   CHOLECYSTECTOMY     laparoscopic   COLONOSCOPY     x3   EYE SURGERY     Left eye 03/02/2018, right 02/15/2018   HEMORROIDECTOMY  08/19/2020   HIP ARTHROSCOPY W/ LABRAL REPAIR Right 05/11/2013   acetabular labral tear 03/30/2013   KNEE ARTHROPLASTY Right    KNEE JOINT MANIPULATION Left    x3 under anesthesia   KNEE SURGERY Bilateral 1984   Right ACL, left PCL repair   LITHOTRIPSY  2005   LIVER BIOPSY  2013   normal results.   MASTECTOMY Bilateral    prior to 2009   MOUTH SURGERY     NASAL SEPTUM SURGERY N/A 09/20/2015   by ENT Dr. Ethyl   OVARIAN CYST SURGERY Left    size of grapefruit, was informed that she had shortened vagina   SHOULDER SURGERY Bilateral    Right 08/15/2016, Left 11/15/2016   THUMB ARTHROSCOPY Left    THYROIDECTOMY, PARTIAL Left 2008   TOTAL KNEE ARTHROPLASTY Right 08/23/2018   Procedure: TOTAL KNEE ARTHROPLASTY;  Surgeon: Melodi Lerner, MD;  Location: [facility];  Service: Orthopedics;  Laterality: Right;    TOTAL KNEE  REVISION Left 02/06/2016   Procedure: LEFT TOTAL KNEE REVISION;  Surgeon: Lerner Melodi, MD;  Location: [facility];  Service: Orthopedics;  Laterality: Left;   TOTAL KNEE REVISION Left 04/22/2017   Procedure: Left knee polyethylene revision;  Surgeon: Melodi Lerner, MD;  Location: [facility];  Service: Orthopedics;  Laterality:  Left;   UPPER GI ENDOSCOPY  2003    Current Medications: Current Meds  Medication Sig   baclofen  (LIORESAL ) 20 MG tablet Take 1-2 tablets (20-40 mg total) by mouth 4 (four) times daily. 1 tablet with breakfast, lunch, and dinner and 2 tablets at bedtime   Blood Glucose Monitoring Suppl (FREESTYLE LITE) w/Device KIT Use to check blood sugar daily and as needed.   desonide  (DESOWEN ) 0.05 % cream Apply to affected area(s) twice daily   Desvenlafaxine  ER (PRISTIQ ) 50 MG TB24 Take 1 tablet by mouth daily.   famotidine  (PEPCID ) 40 MG tablet Take 1 tablet (40 mg total) by mouth at bedtime.   finasteride  (PROPECIA ) 1 MG tablet Take 1 tablet (1 mg total) by mouth daily.   Fluocinolone  Acetonide 0.01 % OIL Place 4 drops in ear(s) as needed.   folic acid  (FOLVITE ) 1 MG tablet Take 1 tablet (1,000 mcg total) by mouth daily.   furosemide  (LASIX ) 20 MG tablet Take 1 tablet (20 mg total) by mouth daily as needed for edema.   furosemide  (LASIX ) 80 MG tablet Take 1 tablet (80 mg total) by mouth daily.   glucose blood test strip Check blood sugar daily and as needed.   guselkumab  (TREMFYA ) 100 MG/ML prefilled syringe Inject 1 mL (100 mg total) under the skin every 8 (eight) weeks.   halobetasol  (ULTRAVATE ) 0.05 % cream Apply 1(one) application(s) topical 2(two) times a day to hands, feet and knees   hydrOXYzine  (ATARAX ) 25 MG tablet Take 1 tablet (25 mg total) by mouth 3 (three) times daily AND 2 tablets (50 mg total) at bedtime as needed for sleep/anxiety   Lancets (FREESTYLE) lancets Use to check blood sugar daily and as needed   metFORMIN  (GLUCOPHAGE ) 500 MG tablet Take 2 tablets (1,000 mg  total) by mouth 2 (two) times daily with a meal. (Patient taking differently: Take 1,500 mg by mouth in the morning, at noon, and at bedtime.)   Methotrexate  2 MG/ML SOLN Take 25 mLs by mouth once a week.   metoprolol  tartrate (LOPRESSOR ) 25 MG tablet Take 1 tablet (25 mg total) by mouth 2 (two) times daily as needed.   morphine  (MS CONTIN ) 30 MG 12 hr tablet Take 1 tablet (30 mg total) by mouth every 12 (twelve) hours.   ondansetron  (ZOFRAN -ODT) 4 MG disintegrating tablet Take 4 mg by mouth every 8 (eight) hours as needed for vomiting or nausea.   Oxycodone  HCl 10 MG TABS Take 1 tablet (10 mg total) by mouth every 8 (eight) hours as needed (pain).   Pancrelipase , Lip-Prot-Amyl, (CREON ) 24000-76000 units CPEP Take 1-2 capsules by mouth 3 (three) times daily with meals. And snacks   potassium chloride  (KLOR-CON ) 10 MEQ tablet Take 2 tablets by mouth every day.   RABEprazole  (ACIPHEX ) 20 MG tablet Take 1 tablet (20 mg total) by mouth in the morning AND 1 tablet (20 mg total) every evening. Take before meals.   rivaroxaban  (XARELTO ) 20 MG TABS tablet Take 1 tablet (20 mg total) by mouth Nightly.   rOPINIRole  (REQUIP ) 0.25 MG tablet Take 1 tablet (0.25 mg total) by mouth at bedtime.   Tenapanor  HCl (IBSRELA ) 50 MG TABS Take 50 mg by mouth 2 (two) times daily.   testosterone  cypionate (DEPOTESTOSTERONE CYPIONATE) 200 MG/ML injection Inject 0.5 mLs (100 mg total) into the muscle every 7 (seven) days.   TUBERCULIN SYR 1CC/27GX1/2 (B-D TB SYRINGE 1CC/27GX1/2) 27G X 1/2 1 ML MISC Use 1 syringe to inject under the skin once weekly  Vilazodone  HCl (VIIBRYD ) 40 MG TABS Take 1 tablet (40 mg total) by mouth daily.   Vitamin D , Ergocalciferol , (DRISDOL ) 1.25 MG (50000 UNIT) CAPS capsule Take 50,000 Units by mouth every 7 (seven) days.     Allergies:   Duloxetine, Gabapentin , Misc. sulfonamide containing compounds, Other, Penicillin g, Silver, Sulfa antibiotics, Sulfamethoxazole, Vancomycin , Citalopram, Gluten  meal, Nortriptyline , Pregabalin, Ibuprofen, and Sulfacetamide sodium-sulfur   Social History   Socioeconomic History   Marital status: Married    Spouse name: Not on file   Number of children: 2   Years of education: 4y college   Highest education level: Master's degree (e.g., MA, MS, MEng, MEd, MSW, MBA)  Occupational History   Occupation: Restaurant manager, fast food    Comment: Not working since CVA 2015  Tobacco Use   Smoking status: Never   Smokeless tobacco: Never  Vaping Use   Vaping status: Never Used  Substance and Sexual Activity   Alcohol  use: Yes    Comment: social   Drug use: No   Sexual activity: Yes    Birth control/protection: None    Comment: patient is a transgender on testosterone  shots, no biological kids  Other Topics Concern   Not on file  Social History Narrative   Not on file   Social Drivers of Health   Financial Resource Strain: Low Risk  (03/12/2023)   Overall Financial Resource Strain (CARDIA)    Difficulty of Paying Living Expenses: Not very hard  Recent Concern: Financial Resource Strain - High Risk (12/12/2022)   Overall Financial Resource Strain (CARDIA)    Difficulty of Paying Living Expenses: Hard  Food Insecurity: Low Risk  (09/18/2023)   Received from [facility]   Hunger Vital Sign    Within the past 12 months, you worried that your food would run out before you got money to buy more: Never true    Within the past 12 months, the food you bought just didn't last and you didn't have money to get more. : Never true  Transportation Needs: No Transportation Needs (09/18/2023)   Received from [facility]    In the past 12 months, has lack of reliable transportation kept you from medical appointments, meetings, work or from getting things needed for daily living? : No  Physical Activity: Unknown (03/12/2023)   Exercise Vital Sign    Days of Exercise per Week: 0 days    Minutes of Exercise per Session: Not on file  Stress:  Stress Concern Present (03/12/2023)   [employer] of Occupational Health - Occupational Stress Questionnaire    Feeling of Stress : Rather much  Social Connections: Socially Isolated (03/12/2023)   Social Connection and Isolation Panel    Frequency of Communication with Friends and Family: Never    Frequency of Social Gatherings with Friends and Family: Never    Attends Religious Services: Never    Database administrator or Organizations: No    Attends Engineer, structural: Not on file    Marital Status: Married     Family History: The patient's family history includes Breast cancer in his sister; Breast cancer (age of onset: 65) in his maternal aunt; Cancer in his paternal grandfather; Congestive Heart Failure in his maternal grandmother; Glaucoma in his maternal grandfather and mother; Heart attack in his maternal grandfather, maternal grandmother, and paternal grandfather; Hypertension in his mother; Macular degeneration in his maternal grandfather; Other in his mother; Polycythemia in his paternal uncle; Protein C deficiency (age of  onset: 23) in his sister; Psoriasis in his mother; Stroke in his maternal grandfather, maternal grandmother, and paternal uncle.  ROS:   Please see the history of present illness.    All other systems are reviewed and are negative  EKGs/Labs/Other Studies Reviewed:    EKG:   EKG Interpretation Date/Time:  Friday November 13 2023 10:18:07 EDT Ventricular Rate:  86 PR Interval:  158 QRS Duration:  94 QT Interval:  346 QTC Calculation: 414 R Axis:   51  Text Interpretation: Normal sinus rhythm Normal ECG When compared with ECG of 21-Nov-2022 08:07, T wave amplitude has decreased in Anterior leads Confirmed by Stella Encarnacion ([id number]) on 11/13/2023 10:50:37 AM         Recent Labs: 04/14/2023: Hemoglobin 14.9; Platelets 203.0  Recent Lipid Panel    Component Value Date/Time   CHOL 155 04/14/2023 1012   CHOL 142 01/05/2018 1509   TRIG  69.0 04/14/2023 1012   HDL 52.40 04/14/2023 1012   HDL 52 01/05/2018 1509   CHOLHDL 3 04/14/2023 1012   VLDL 13.8 04/14/2023 1012   LDLCALC 89 04/14/2023 1012   LDLCALC 81 01/05/2018 1509   05/01/2021  cholesterol 154, HDL 54, LDL 86, triglycerides 70.  11/05/2021 Hemoglobin 15.3, creatinine 1.04, potassium 4.5, ALT 36, TSH 2.20  Most recent hemoglobin A1c was 5.4% (August 2022).  Physical Exam:    VS:  BP 112/68 (BP Location: Left Arm, Patient Position: Sitting, Cuff Size: Normal)   Pulse 86   Ht 5' 8 (1.727 m)   Wt 217 lb (98.4 kg)   SpO2 97%   BMI 32.99 kg/m     Wt Readings from Last 3 Encounters:  11/13/23 217 lb (98.4 kg)  10/26/23 213 lb (96.6 kg)  08/10/23 219 lb (99.3 kg)     General: Alert, oriented x3, no distress, mildly obese, but appears fit Head: no evidence of trauma, PERRL, EOMI, no exophtalmos or lid lag, no myxedema, no xanthelasma; normal ears, nose and oropharynx Neck: normal jugular venous pulsations and no hepatojugular reflux; brisk carotid pulses without delay and no carotid bruits Chest: clear to auscultation, no signs of consolidation by percussion or palpation, normal fremitus, symmetrical and full respiratory excursions Cardiovascular: normal position and quality of the apical impulse, regular rhythm, normal first and second heart sounds, no murmurs, rubs or gallops Abdomen: no tenderness or distention, no masses by palpation, no abnormal pulsatility or arterial bruits, normal bowel sounds, no hepatosplenomegaly Extremities: no clubbing, cyanosis or edema; 2+ radial, ulnar and brachial pulses bilaterally; 2+ right femoral, posterior tibial and dorsalis pedis pulses; 2+ left femoral, posterior tibial and dorsalis pedis pulses; no subclavian or femoral bruits Neurological: grossly nonfocal Psych: Normal mood and affect    ASSESSMENT:    1. Chronic diastolic heart failure (HCC)   2. History of sleep apnea   3. Palpitations   4. Protein C  deficiency (HCC)       PLAN:    In order of problems listed above:  CHF: Good functional status, clinically euvolemic. Diastolic dysfunction, diuretics as needed.  OSA/CSA: Remede device implanted in December 2024, via [place]. Encourage more weight loss Protein C deficiency: On chronic Xarelto  without bleeding problems. Reported history of stroke. MRI stable 2016-2017-2019: tiny punctate periventricular and subcortical white matter hyperintensities likely due to chronic microvascular ischemia, Scattered chronic cerebral white matter signal abnormality is nonspecific but compatible with sequelae of hypercoagulable state.  He does not have a history of atrial fibrillation.  From a cardiology point of  view, it should be low risk for him to temporarily interrupt Xarelto  for 2-3 days before surgical procedures. Palpitations: Having daily palpitations now. Recheck a 3-day monitor  Medication Adjustments/Labs and Tests Ordered: Current medicines are reviewed at length with the patient today.  Concerns regarding medicines are outlined above.  Orders Placed This Encounter  Procedures   LONG TERM MONITOR (3-14 DAYS)   EKG 12-Lead    No orders of the defined types were placed in this encounter.    Patient Instructions  Medication Instructions:  No changes *If you need a refill on your cardiac medications before your next appointment, please call your pharmacy*  Lab Work: None ordered If you have labs (blood work) drawn today and your tests are completely normal, you will receive your results only by: MyChart Message (if you have MyChart) OR A paper copy in the mail If you have any lab test that is abnormal or we need to change your treatment, we will call you to review the results.  Testing/Procedures: Your physician has recommended that you wear a 3 DAY ZIO-PATCH monitor. The Zio patch cardiac monitor continuously records heart rhythm data for up to 14 days, this is for patients  being evaluated for multiple types heart rhythms. For the first 24 hours post application, please avoid getting the Zio monitor wet in the shower or by excessive sweating during exercise. After that, feel free to carry on with regular activities. Keep soaps and lotions away from the ZIO XT Patch.  This will be mailed to you, please expect 7-10 days to receive.    Applying the monitor   Shave hair from upper left chest.   Hold abrader disc by orange tab.  Rub abrader in 40 strokes over left upper chest as indicated in your monitor instructions.   Clean area with 4 enclosed alcohol  pads .  Use all pads to assure are is cleaned thoroughly.  Let dry.   Apply patch as indicated in monitor instructions.  Patch will be place under collarbone on left side of chest with arrow pointing upward.   Rub patch adhesive wings for 2 minutes.Remove white label marked 1.  Remove white label marked 2.  Rub patch adhesive wings for 2 additional minutes.   While looking in a mirror, press and release button in center of patch.  A small green light will flash 3-4 times .  This will be your only indicator the monitor has been turned on.     Do not shower for the first 24 hours.  You may shower after the first 24 hours.   Press button if you feel a symptom. You will hear a small click.  Record Date, Time and Symptom in the Patient Log Book.   When you are ready to remove patch, follow instructions on last 2 pages of Patient Log Book.  Stick patch monitor onto last page of Patient Log Book.   Place Patient Log Book in [place] box.  Use locking tab on box and tape box closed securely.  The [employer] and [employer] has [employer] on it.  Please place in mailbox as soon as possible.  Your physician should have your test results approximately 7 days after the monitor has been mailed back to [facility].   Call [facility] Customer Care at [phone] if you have questions regarding your ZIO XT patch monitor.   Call them immediately if you see an orange light blinking on your monitor.   If your monitor falls off  in less than 4 days contact our Monitor department at [phone].  If your monitor becomes loose or falls off after 4 days call [employer] at [phone] for suggestions on securing your monitor   Follow-Up: At [facility], you and your health needs are our priority.  As part of our continuing mission to provide you with exceptional heart care, our providers are all part of one team.  This team includes your primary Cardiologist (physician) and Advanced Practice Providers or APPs (Physician Assistants and Nurse Practitioners) who all work together to provide you with the care you need, when you need it.  Your next appointment:   1 year(s)  Provider:   Jerel Balding, MD    We recommend signing up for the patient portal called MyChart.  Sign up information is provided on this After Visit Summary.  MyChart is used to connect with patients for Virtual Visits (Telemedicine).  Patients are able to view lab/test results, encounter notes, upcoming appointments, etc.  Non-urgent messages can be sent to your provider as well.   To learn more about what you can do with MyChart, go to [email or web address].             Signed, Jerel Balding, MD  11/13/2023 10:52 AM    [facility]

## 2023-11-13 NOTE — Progress Notes (Unsigned)
 Enrolled for [employer] to mail a ZIO XT long term holter monitor to the patients address on file.

## 2023-11-13 NOTE — Patient Instructions (Signed)
 Medication Instructions:  No changes *If you need a refill on your cardiac medications before your next appointment, please call your pharmacy*  Lab Work: None ordered If you have labs (blood work) drawn today and your tests are completely normal, you will receive your results only by: MyChart Message (if you have MyChart) OR A paper copy in the mail If you have any lab test that is abnormal or we need to change your treatment, we will call you to review the results.  Testing/Procedures: Your physician has recommended that you wear a 3 DAY ZIO-PATCH monitor. The Zio patch cardiac monitor continuously records heart rhythm data for up to 14 days, this is for patients being evaluated for multiple types heart rhythms. For the first 24 hours post application, please avoid getting the Zio monitor wet in the shower or by excessive sweating during exercise. After that, feel free to carry on with regular activities. Keep soaps and lotions away from the ZIO XT Patch.  This will be mailed to you, please expect 7-10 days to receive.    Applying the monitor   Shave hair from upper left chest.   Hold abrader disc by orange tab.  Rub abrader in 40 strokes over left upper chest as indicated in your monitor instructions.   Clean area with 4 enclosed alcohol  pads .  Use all pads to assure are is cleaned thoroughly.  Let dry.   Apply patch as indicated in monitor instructions.  Patch will be place under collarbone on left side of chest with arrow pointing upward.   Rub patch adhesive wings for 2 minutes.Remove white label marked 1.  Remove white label marked 2.  Rub patch adhesive wings for 2 additional minutes.   While looking in a mirror, press and release button in center of patch.  A small green light will flash 3-4 times .  This will be your only indicator the monitor has been turned on.     Do not shower for the first 24 hours.  You may shower after the first 24 hours.   Press button if you  feel a symptom. You will hear a small click.  Record Date, Time and Symptom in the Patient Log Book.   When you are ready to remove patch, follow instructions on last 2 pages of Patient Log Book.  Stick patch monitor onto last page of Patient Log Book.   Place Patient Log Book in [place] box.  Use locking tab on box and tape box closed securely.  The [employer] has [employer] on it.  Please place in mailbox as soon as possible.  Your physician should have your test results approximately 7 days after the monitor has been mailed back to [facility].   Call [facility] Customer Care at [phone] if you have questions regarding your ZIO XT patch monitor.  Call them immediately if you see an orange light blinking on your monitor.   If your monitor falls off in less than 4 days contact our Monitor department at [phone].  If your monitor becomes loose or falls off after 4 days call [employer] at [phone] for suggestions on securing your monitor   Follow-Up: At [facility], you and your health needs are our priority.  As part of our continuing mission to provide you with exceptional heart care, our providers are all part of one team.  This team includes your primary Cardiologist (physician) and Advanced Practice Providers or APPs (Physician Assistants and Nurse Practitioners)  who all work together to provide you with the care you need, when you need it.  Your next appointment:   1 year(s)  Provider:   Jerel Balding, MD    We recommend signing up for the patient portal called MyChart.  Sign up information is provided on this After Visit Summary.  MyChart is used to connect with patients for Virtual Visits (Telemedicine).  Patients are able to view lab/test results, encounter notes, upcoming appointments, etc.  Non-urgent messages can be sent to your provider as well.   To learn more about what you can do with MyChart, go to [email or web address].

## 2023-11-16 DIAGNOSIS — K760 Fatty (change of) liver, not elsewhere classified: Secondary | ICD-10-CM | POA: Diagnosis not present

## 2023-11-16 DIAGNOSIS — K900 Celiac disease: Secondary | ICD-10-CM | POA: Diagnosis not present

## 2023-11-18 DIAGNOSIS — F331 Major depressive disorder, recurrent, moderate: Secondary | ICD-10-CM | POA: Diagnosis not present

## 2023-11-19 NOTE — Progress Notes (Signed)
 Initial neurology clinic note  Reason for Evaluation: Consultation requested by Jesse Bowers HERO, MD for an opinion regarding weakness and falls. My final recommendations will be communicated back to the requesting physician by way of shared medical record or letter to requesting physician via [place]  mail.  HPI: This is [name], a 58 y.o. right-handed adult with a medical history of protein C deficiency, psoriatic arthritis, pancreatic insufficiency, OA, lumbar radiculopathy, syringomyelia (C4-C6), chronic pain syndrome, chronic fatigue syndrome, OSA who presents to neurology clinic with the chief complaint of weakness, speech changes, cognitive changes. The patient is alone today.  Patient's symptoms started 10 years ago. Patient had difficulty finding words upon waking. Patient was not able to move right arm and limping in right leg. Patient kept dropping things with the right hand. The right side was numb. Patient felt like it could be a stroke. Patient was taken to ED. Work up did not show stroke. Despite deficits persisting, patient was told this was a possible TIA. Patient saw a neurologist in [place]  who said patient had a left frontal lobe stroke. A protein C deficiency was found, so patient was put on Xarelto  which patient continues today (managed by hematology).   Patient had other transient neurologic episodes of word finding difficulty, memory problems, and problems organizing thoughts.  Patient has been on disability since 2015 (permanent since 2019). Patient was in a power wheel chair until a couple of years ago due to severe pain. Patient cannot walk very far currently. Patient sees pain management. Patient sees rheumatology for psoriatic arthritis.  Current symptoms:  -cognitive changes - memory particularly -occasional word finding difficulty - when flustered cannot get words out -weakness in both legs (left > right) - physical weakness on left, neurologic  weakness on right -weakness in both arms -numbness and tingling in toes and outside of lower legs, hands -Feels like a cape sensation across back and shoulders -Recent fall where legs gave out - this prompted new neurology referral -Patient has a lot of nausea requiring frequent zofran  -Has Remid device for central sleep apnea (sees sleep medicine)  Patient has been seen at [facility] and [facility] neurology in the past as well with extensive work up only being significant for chronic syringomyelia at C4-C6. EMG at [facility] 04/01/2018 of bilateral lower extremities that was normal. Patient saw neurosurgery for syringomyelia in 2019 who did not think there was anything they could do.  His latest MRI cervical spine was stable in 2023, but patient feels symptoms are worse since then.  Patient takes B12 for prior borderline B12 level.  Of note, patient sees a therapist for anxiety.   Patient had done PT many times.  Patient is a former NP.   MEDICATIONS:  Outpatient Encounter Medications as of 11/27/2023  Medication Sig Note   baclofen  (LIORESAL ) 20 MG tablet Take 1-2 tablets (20-40 mg total) by mouth 4 (four) times daily. 1 tablet with breakfast, lunch, and dinner and 2 tablets at bedtime    Blood Glucose Monitoring Suppl (FREESTYLE LITE) w/Device KIT Use to check blood sugar daily and as needed.    desonide  (DESOWEN ) 0.05 % cream Apply to affected area(s) twice daily    Desvenlafaxine  ER (PRISTIQ ) 50 MG TB24 Take 1 tablet by mouth daily.    famotidine  (PEPCID ) 40 MG tablet Take 1 tablet (40 mg total) by mouth at bedtime.    finasteride  (PROPECIA ) 1 MG tablet Take 1 tablet (1 mg total) by mouth daily.  Fluocinolone  Acetonide 0.01 % OIL Place 4 drops in ear(s) as needed.    folic acid  (FOLVITE ) 1 MG tablet Take 1 tablet (1,000 mcg total) by mouth daily.    furosemide  (LASIX ) 20 MG tablet Take 1 tablet (20 mg total) by mouth daily as needed for edema.    furosemide  (LASIX ) 80 MG tablet Take 1 tablet  (80 mg total) by mouth daily.    glucose blood test strip Check blood sugar daily and as needed.    halobetasol  (ULTRAVATE ) 0.05 % cream Apply 1(one) application(s) topical 2(two) times a day to hands, feet and knees    hydrOXYzine  (ATARAX ) 25 MG tablet Take 1 tablet (25 mg total) by mouth 3 (three) times daily AND 2 tablets (50 mg total) at bedtime as needed for sleep/anxiety    Lancets (FREESTYLE) lancets Use to check blood sugar daily and as needed    metFORMIN  (GLUCOPHAGE ) 500 MG tablet Take 2 tablets (1,000 mg total) by mouth 2 (two) times daily with a meal.    Methotrexate  2 MG/ML SOLN Take 25 mLs by mouth once a week.    metoprolol  tartrate (LOPRESSOR ) 25 MG tablet Take 1 tablet (25 mg total) by mouth 2 (two) times daily as needed.    morphine  (MS CONTIN ) 30 MG 12 hr tablet Take 1 tablet (30 mg total) by mouth every 12 (twelve) hours.    ondansetron  (ZOFRAN ) 4 MG tablet Take 1 tablet (4 mg total) by mouth every 8 (eight) hours as needed for nausea or vomiting.    ondansetron  (ZOFRAN -ODT) 4 MG disintegrating tablet Take 4 mg by mouth every 8 (eight) hours as needed for vomiting or nausea.    Oxycodone  HCl 10 MG TABS Take 1 tablet (10 mg total) by mouth every 8 (eight) hours as needed (pain).    Pancrelipase , Lip-Prot-Amyl, (CREON ) 24000-76000 units CPEP Take 1-2 capsules by mouth 3 (three) times daily with meals. And snacks    potassium chloride  (KLOR-CON ) 10 MEQ tablet Take 2 tablets by mouth every day.    RABEprazole  (ACIPHEX ) 20 MG tablet Take 1 tablet (20 mg total) by mouth in the morning AND 1 tablet (20 mg total) every evening. Take before meals.    rivaroxaban  (XARELTO ) 20 MG TABS tablet Take 1 tablet (20 mg total) by mouth Nightly.    rOPINIRole  (REQUIP ) 0.25 MG tablet Take 1 tablet (0.25 mg total) by mouth at bedtime.    Tenapanor  HCl (IBSRELA ) 50 MG TABS Take 50 mg by mouth 2 (two) times daily.    testosterone  cypionate (DEPOTESTOSTERONE CYPIONATE) 200 MG/ML injection Inject 0.5 mLs  (100 mg total) into the muscle every 7 (seven) days.    TUBERCULIN SYR 1CC/27GX1/2 (B-D TB SYRINGE 1CC/27GX1/2) 27G X 1/2 1 ML MISC Use 1 syringe to inject under the skin once weekly    Vilazodone  HCl (VIIBRYD ) 40 MG TABS Take 1 tablet (40 mg total) by mouth daily.    Vitamin D , Ergocalciferol , (DRISDOL ) 1.25 MG (50000 UNIT) CAPS capsule Take 50,000 Units by mouth every 7 (seven) days.    guselkumab  (TREMFYA ) 100 MG/ML prefilled syringe Inject 1 mL (100 mg total) under the skin every 8 (eight) weeks. (Patient not taking: Reported on 11/27/2023)    ondansetron  (ZOFRAN ) 8 MG tablet Take 1 tablet (8 mg total) by mouth every 8 (eight) hours as needed for nausea or vomiting. (Patient not taking: Reported on 11/27/2023)    [DISCONTINUED] pantoprazole  (PROTONIX ) 40 MG tablet Take 1 tablet (40 mg total) by mouth every morning before  breakfast. 11/21/2022: Patient takes 50 mg daily   No facility-administered encounter medications on file as of 11/27/2023.    PAST MEDICAL HISTORY: Past Medical History:  Diagnosis Date   Abnormal weight loss    Anxiety    Arthritis    Cataract    OU   Celiac disease    Cervical neck pain with evidence of disc disease    patient has a cyst    Chronic constipation    Chronic diastolic heart failure (HCC)    Pt. 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   Family history of adverse reaction to anesthesia    family has problems with anesthesia of nausea and vomiting    GERD (gastroesophageal reflux disease)    History of in 20's   Gluten enteropathy    H/O parotitis    right    Hard of hearing    History of kidney stones    History of retinal tear    Bilateral   History of staph infection    required wound vac   Hx-TIA (transient ischemic attack)     2015   Kidney stones 08/2020   LVH (left ventricular hypertrophy) 12/15/2016   Mild, noted on ECHO   MVP (mitral valve prolapse)    NAFL (nonalcoholic fatty liver)    Neck pain    Neuromuscular disorder (HCC)    bilateral neuropathy feet.   Nuclear sclerotic cataract of both eyes 09/08/2019   Pneumonia 12/17/2010   Polycythemia    Polycythemia, secondary    PONV (postoperative nausea and vomiting)    Protein C deficiency    Dr. Lanny Abide   Psoriasis    16 X10 cm psoriatic rash on sole of left foot ; open and occ scant bleeding;    psoriatic arthritis    PTSD (post-traumatic stress disorder)    Scaphoid fracture of wrist 09/23/2013   Seizure (HCC)    childhood, medication until age 77 then weanned completely off   Sleep apnea    split night study last done by Dr. Vear 06/18/15 shows severe OSA, CSA, and hypersomnia, rec bipap   Splenomegaly    Stenosis of ureteropelvic junction (UPJ)    left   Stroke [facility])    CVA vs TIA in left cerebrum causing slight right sided weakness-Dr. 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    Wears glasses    Wears hearing aid     PAST SURGICAL HISTORY: Past Surgical History:  Procedure Laterality Date   ABDOMINAL HYSTERECTOMY Bilateral 1994   TAH, BSO- tranverse incision at 58 yo   ANKLE ARTHROSCOPY WITH RECONSTRUCTION Right 2007   CHOLECYSTECTOMY     laparoscopic   COLONOSCOPY     x3   EYE SURGERY     Left eye 03/02/2018, right 02/15/2018   HEMORROIDECTOMY  08/19/2020   HIP ARTHROSCOPY W/ LABRAL REPAIR Right 05/11/2013   acetabular labral tear 03/30/2013   KNEE ARTHROPLASTY Right  KNEE JOINT MANIPULATION Left    x3 under anesthesia   KNEE SURGERY Bilateral 1984   Right ACL, left PCL repair   LITHOTRIPSY  2005   LIVER BIOPSY   2013   normal results.   MASTECTOMY Bilateral    prior to 2009   MOUTH SURGERY     NASAL SEPTUM SURGERY N/A 09/20/2015   by ENT Dr. Ethyl   OVARIAN CYST SURGERY Left    size of grapefruit, was informed that she had shortened vagina   SHOULDER SURGERY Bilateral    Right 08/15/2016, Left 11/15/2016   THUMB ARTHROSCOPY Left    THYROIDECTOMY, PARTIAL Left 2008   TOTAL KNEE ARTHROPLASTY Right 08/23/2018   Procedure: TOTAL KNEE ARTHROPLASTY;  Surgeon: Melodi Lerner, MD;  Location: [facility];  Service: Orthopedics;  Laterality: Right;    TOTAL KNEE REVISION Left 02/06/2016   Procedure: LEFT TOTAL KNEE REVISION;  Surgeon: Lerner Melodi, MD;  Location: [facility];  Service: Orthopedics;  Laterality: Left;   TOTAL KNEE REVISION Left 04/22/2017   Procedure: Left knee polyethylene revision;  Surgeon: Melodi Lerner, MD;  Location: [facility];  Service: Orthopedics;  Laterality: Left;   UPPER GI ENDOSCOPY  2003    ALLERGIES: Allergies  Allergen Reactions   Duloxetine Other (See Comments)    Restless legs  duloxetine   Gabapentin  Nausea Only and Other (See Comments)    Other reaction(s): nausea, mental status  Drowsiness and restlessness. Pt. States,  It makes me crazy, I can't take this medicine.  Other reaction(s): Other (See Comments)  Tardive dyskinesia  Other Reaction(s): Other (See Comments)  Drowsiness and restlessness.   Misc. Sulfonamide Containing Compounds Rash and Other (See Comments)    Stevens-Johnson rash   Other Dermatitis    STEVENS JOHNSON SYNDROME, Sulfa/Bactrim caused it   Penicillin G Anaphylaxis and Other (See Comments)    Has patient had a PCN reaction causing immediate rash, facial/tongue/throat swelling, SOB or lightheadedness with hypotension: Yes  Has patient had a PCN reaction causing severe rash involving mucus membranes or skin necrosis: No  Has patient had a PCN reaction that required hospitalization Yes  Has patient had a PCN reaction occurring  within the last 10 years: No  If all of the above answers are NO, then may proceed with Cephalosporin use.  Other Reaction(s): other  penicillin G   Silver Dermatitis, Hives, Itching, Rash and Swelling    Causes blistering wounds   Sulfa Antibiotics Rash    Stevens-Johnson rash   Sulfamethoxazole     Other Reaction(s): Stevens-Johnson Syndrome   Vancomycin  Other (See Comments) and Rash    RED MAN SYNDROME    CAN HAVE IF GIVEN OVER 2HOURS  Other Reaction(s): Other (See Comments), stevens-johnson syndrome  RED MAN SYNDROME      CAN HAVE IF GIVEN OVER 2HOURS  rash    RED MAN SYNDROME   CAN HAVE IF GIVEN OVER 2HOURS    RED MAN SYNDROME    CAN HAVE IF GIVEN OVER 2HOURS    rash RED MAN SYNDROME   CAN HAVE IF GIVEN OVER 2HOURS RED MAN SYNDROME   CAN HAVE IF GIVEN OVER 2HOURS RED MAN SYNDROME   CAN HAVE IF GIVEN OVER 2HOURS   Citalopram Other (See Comments)    Dystonia  Other reaction(s): Other (See Comments) Dystonia    Other reaction(s): Other (See Comments) Dystonia Dystonia Dystonia   Gluten Meal Nausea And Vomiting    Has celiac disease   Nortriptyline  Other (See Comments)  Dry mouth at 25 mg dose.  Tolerates 10 mg dose  Other reaction(s): Other (See Comments)  Dry Mouth  Other Reaction(s): Other (See Comments)  Dry Mouth at 25 mg dose. Tolerates 10 mg dose.    Dry mouth at 25 mg dose.  Tolerates 10 mg dose    Dry Mouth    Dry mouth at 25 mg dose.  Tolerates 10 mg dose Other reaction(s): Other (See Comments) Dry Mouth    Dry mouth at 25 mg dose.  Tolerates 10 mg dose Dry Mouth at 25 mg dose. Tolerates 10 mg dose. Dry mouth at 25 mg dose.  Tolerates 10 mg dose   Pregabalin Other (See Comments)    Ineffective  Other reaction(s): Other (See Comments)  Tardive dyskinesia  Other Reaction(s): Other (See Comments)  Ineffective    Tardive dyskinesia    Ineffective Other reaction(s): Other (See Comments) Tardive dyskinesia    Ineffective Ineffective    Ibuprofen Other (See Comments)    Contraindicated with Xarelto .    Sulfacetamide Sodium-Sulfur Rash    FAMILY HISTORY: Family History  Problem Relation Age of Onset   Stroke Maternal Grandfather        61   Heart attack Maternal Grandfather    Glaucoma Maternal Grandfather    Macular degeneration Maternal Grandfather    Breast cancer Sister    Hypertension Mother    Psoriasis Mother    Other Mother        meningioma developed ~2019   Glaucoma Mother    Cancer Paternal Grandfather    Heart attack Paternal Grandfather    Stroke Paternal Uncle        age 23   Polycythemia Paternal Uncle    Stroke Maternal Grandmother    Congestive Heart Failure Maternal Grandmother    Heart attack Maternal Grandmother    Protein C deficiency Sister 27       Miscarriages   Breast cancer Maternal Aunt 35    SOCIAL HISTORY: Social History   Tobacco Use   Smoking status: Never   Smokeless tobacco: Never  Vaping Use   Vaping status: Never Used  Substance Use Topics   Alcohol  use: Yes    Comment: social   Drug use: No   Social History   Social History Narrative   Are you right handed or left handed? right   Are you currently employed ?    What is your current occupation? disabled   Do you live at home alone?   Who lives with you? Wife and 2 dogs   What type of home do you live in: 1 story or 2 story? two   Caffiene 1 soda a day      OBJECTIVE: PHYSICAL EXAM: BP (!) 144/83   Pulse 94   Ht 5' 8 (1.727 m)   Wt 217 lb (98.4 kg)   SpO2 98%   BMI 32.99 kg/m   General: General appearance: Awake and alert. No distress. Cooperative with exam.  Skin: Psoriatic plaques appreciated on bilateral feet HEENT: Atraumatic. Anicteric. Lungs: Non-labored breathing on room air  Heart: Wearing holter monitor from cardiology currently (for palpitations) Extremities: No obvious deformity.  Musculoskeletal: No obvious joint swelling. Psych: Affect appropriate.  Neurological: Mental  Status: Alert. Speech fluent. Occasional word halting/stuttering like speech. No pseudobulbar affect Cranial Nerves: CNII: No RAPD. Visual fields grossly intact. CNIII, IV, VI: PERRL. No nystagmus. EOMI. CN V: Facial sensation diminished on left compared to right. CN VII: Facial muscles symmetric and strong.  No ptosis at rest CN VIII: Hearing grossly intact bilaterally. CN IX: No hypophonia. CN X: Palate elevates symmetrically. CN XI: Full strength shoulder shrug bilaterally. CN XII: Tongue protrusion full and midline. No atrophy or fasciculations. No significant dysarthria Motor: Tone is normal. 5/5 in bilateral upper and lower extremities with give way weakness Reflexes:  Right Left   Bicep 2+ 2+   Tricep 2+ 2+   BrRad 2+ 2+   Knee 2+ 0 Prior left knee surgery  Ankle 2+ 2+    Pathological Reflexes: Babinski: flexor response bilaterally Hoffman: absent bilaterally Troemner: absent bilaterally Sensation: Pinprick: Patching on face, arms, and legs without clear distribution Coordination: Intact finger-to- nose-finger bilaterally. Romberg negative. Gait: Able to rise from chair with arms crossed unassisted though very oddly with wide spreading of legs and twisting up. Wide based gait, wobbling, but able to catch self.  Lab and Test Review: Internal labs: 04/14/23: Lipid panel: tChol 155, LDL 89, TG 69.0 B12: 437  External labs: 10/26/23: CMP significant for Cr 1.46, alk phos 133 CBC w/ diff significant for Hb 13.8, MCV 76.5  10/05/23: Ferritin 8 TSH wnl  09/01/23: Vit D B12: 282 Folate 9.9 Zinc : 60  Imaging/Procedures: EEG (04/20/14): This is a normal EEG recording in the waking and drowsy state. No evidence of ictal or interictal discharges are seen.   MRI brain wo contrast (12/26/17): FINDINGS: Brain: Cerebral volume is stable and within normal limits. No restricted diffusion to suggest acute infarction. No midline shift, mass effect, evidence of mass lesion,  ventriculomegaly, extra-axial collection or acute intracranial hemorrhage. Cervicomedullary junction and pituitary are within normal limits.   Scattered small foci of T2 and FLAIR hyperintensity in the bilateral cerebral white matter, most apparent in the anterior frontal lobes (series 10 images 16 through 19), appear stable since 2017. no cortical encephalomalacia or chronic cerebral blood products are identified. The deep gray matter nuclei, brainstem, and cerebellum remain normal.   Vascular: Major intracranial vascular flow voids are stable since 2017 and preserved.   Skull and upper cervical spine: Negative visible cervical spine. Visualized bone marrow signal is within normal limits.   Sinuses/Orbits: Stable and negative.   Other: Visible internal auditory structures appear normal. Mastoids remain clear. Scalp and face soft tissues appear negative.   IMPRESSION: No acute intracranial abnormality and stable noncontrast MRI appearance of the brain since 2017.   Scattered chronic cerebral white matter signal abnormality is nonspecific but compatible with sequelae of hypercoagulable state. No progression is evident since 2017.  EMG ([facility] 04/01/2018): Findings NCS: Sensory: Sensory studies of the bilateral lower extremities were performed as described below. All nerves of study demonstrated peak latencies and amplitudes within normal limits.  Motor: Motor studies of the bilateral lower extremities were performed as described below. Studies of the right fibular nerve were unobtainable. All other nerves studied demonstrated distal latencies, amplitudes, and conduction velocities within normal limits.  EMG: EMG studies of the bilateral lower extremities were performed as described below. All muscles studied showed no signs of active denervation, chronic reinnervation changes, abnormal muscle morphology, or abnormal recruitment patterns.  Impression: This is a normal study.  At this time there is no electrodiagnostic evidence of a right/left lumbosacral radiculopathy, widespread peripheral neuropathy, or myopathy. The unobtainable right fibular nerve at the EDB is most likely secondary to mechanical trauma and is of no clinical significance.   MRI lumbar spine wo contrast (10/12/20): FINDINGS: Segmentation: Transitional S1 vertebra as established on prior report. Rudimentary disc space at  S1-2.   Alignment:  Prominent lumbar lordosis.  No listhesis.   Vertebrae: Degenerative fatty marrow conversion at L5-S1, interval. No evidence of fracture or bone lesion.   Conus medullaris and cauda equina: Conus extends to the L1-2 level. Conus and cauda equina appear normal.   Paraspinal and other soft tissues: Left extrarenal pelvis appearance.   Disc levels:   L5-S1 disc collapse and desiccation with endplate degeneration and mild endplate ridging. Mild bilateral foraminal narrowing.   Mild disc desiccation, narrowing, and bulging at L4-5.   Minimal left foraminal disc protrusion, chronic.   IMPRESSION: 1. No impingement or inflammation to explain leg symptoms. 2. Focal advanced disc degeneration at L5-S1.  MRI cervical spine wo contrast (10/23/21): FINDINGS: Alignment: Physiologic.   Vertebrae: Diffuse decrease of the T1 signal within the visualized spine may represent red marrow reconversion. However marrow replacement pathologies cannot be entirely excluded. This is new compared to prior MRI. No fracture or evidence of discitis.   Cord: Stable appearance elevated focus of increased T2 signal within the spinal cord extending from the C4 to the C6 level measuring up to 35 mm in length and 4 mm in maximal diameter at the C5 level, consistent with syrinx.   Posterior Fossa, vertebral arteries, paraspinal tissues: Negative.   Disc levels:   C2-3: No spinal canal or neural foraminal stenosis.   C3-4:Facet degenerative change resulting mild-to-moderate  left neural foraminal narrowing, unchanged. No spinal canal stenosis.   C4-5: Minimal disc bulge. Uncovertebral and facet degenerative changes resulting in mild right neural foraminal narrowing. No spinal canal stenosis.   C5-6:Minimal disc bulge. Uncovertebral and facet degenerative changes resulting in mild right neural foraminal narrowing. No spinal canal stenosis.   C6-7: Minimal disc bulge. No spinal canal or neural foraminal stenosis.   C7-T1: No spinal canal or neural foraminal stenosis.   IMPRESSION: 1. No interval change of the syrinx spanning the C4 through C6 levels measuring up to 4 mm in diameter. 2. Mild degenerative changes of the cervical spine without high-grade spinal canal stenosis at any level. 3. Mild to moderate left neural foraminal narrowing at C3-4, unchanged.  EMG (Dr. Emeline in [facility] 01/13/22):   Thoracic spine xray w/ swimmers (12/10/22): FINDINGS: There are 12 rib-bearing thoracic type vertebral bodies. There is a minimal convex right thoracic scoliosis. The lateral alignment is normal. No evidence of acute fracture, paraspinal abnormality or widening of the interpedicular distance. There is mild multilevel thoracic spondylosis with disc space narrowing and endplate osteophytes. Cholecystectomy clips are noted.   IMPRESSION: Mild multilevel thoracic spondylosis. No acute osseous findings.  Cervical spine xray (10/28/23): FINDINGS: Straightening of the normal cervical lordosis. Visualization through C7 on lateral view. Multilevel degenerative disc disease most pronounced C5-6 and C6-7. Prevertebral soft tissues unremarkable. Lung apices are clear. Lateral masses articulate appropriately with the dens.   IMPRESSION: Multilevel degenerative disc disease most pronounced C5-6 and C6-7.  ASSESSMENT: Jesse Bowers is a 58 y.o. adult who presents for evaluation of cognitive changes, speech abnormalities, numbness of face, arms, and legs,  subjective weakness, and a fall. Patient has a relevant medical history of protein C deficiency, psoriatic arthritis, pancreatic insufficiency, OA, lumbar radiculopathy, syringomyelia (C4-C6), chronic pain syndrome, chronic fatigue syndrome, OSA. Patient's neurological examination is pertinent for patchy sensation loss and give way weakness with wobbly, unsteady gait though able to catch self and not fall. Available diagnostic data is significant for multiple MRI brain without significant findings, MRI cervical spine with long standing, stable syringomyelia from  C4-6, and EMG in 2020 of bilateral legs that was normal. Labs have shown borderline low B12 in the past, now normal with supplementation. The etiology of patient's symptoms is unclear and difficult to localize. While a syrinx could cause symptoms in the arms and legs, this would like explain cognitive changes, speech changes, or facial numbness. Extensive work up has not previously been able to explain findings. I will repeat MRI brain and cervical spine to be sure. Symptoms could also represent a functional neurologic disorder.  PLAN: -Repeat MRI brain and cervical spine w/wo contrast -Continue B12 supplementation -Consider another NSGY opinion on syrinx particularly if there is any change on repeat MRI  -Return to clinic to be determined after testing  The impression above as well as the plan as outlined below were extensively discussed with the patient who voiced understanding. All questions were answered to their satisfaction.  The patient was counseled on pertinent fall precautions per the printed material provided today, and as noted under the Patient Instructions section below.  When available, results of the above investigations and possible further recommendations will be communicated to the patient via telephone/MyChart. Patient to call office if not contacted after expected testing turnaround time.   Total time spent reviewing  records, interview, history/exam, documentation, and coordination of care on day of encounter:  65 min   Thank you for allowing me to participate in patient's care.  If I can answer any additional questions, I would be pleased to do so.  Venetia Potters, MD   CC: Jesse Bowers HERO, MD [address] [place] [address]  CC: Referring provider: Ozell Bowers HERO, MD [address] [place],  [place] [address]

## 2023-11-20 ENCOUNTER — Other Ambulatory Visit (HOSPITAL_BASED_OUTPATIENT_CLINIC_OR_DEPARTMENT_OTHER): Payer: Self-pay

## 2023-11-24 ENCOUNTER — Other Ambulatory Visit (HOSPITAL_BASED_OUTPATIENT_CLINIC_OR_DEPARTMENT_OTHER): Payer: Self-pay

## 2023-11-24 MED ORDER — ONDANSETRON HCL 4 MG PO TABS
4.0000 mg | ORAL_TABLET | Freq: Three times a day (TID) | ORAL | 1 refills | Status: DC | PRN
  Filled 2023-11-24: qty 50, 17d supply, fill #0
  Filled 2024-01-09: qty 50, 17d supply, fill #1

## 2023-11-25 DIAGNOSIS — F331 Major depressive disorder, recurrent, moderate: Secondary | ICD-10-CM | POA: Diagnosis not present

## 2023-11-27 ENCOUNTER — Other Ambulatory Visit (HOSPITAL_BASED_OUTPATIENT_CLINIC_OR_DEPARTMENT_OTHER): Payer: Self-pay

## 2023-11-27 ENCOUNTER — Encounter: Payer: Self-pay | Admitting: Neurology

## 2023-11-27 ENCOUNTER — Ambulatory Visit: Admitting: Neurology

## 2023-11-27 VITALS — BP 144/83 | HR 94 | Ht 68.0 in | Wt 217.0 lb

## 2023-11-27 DIAGNOSIS — R4189 Other symptoms and signs involving cognitive functions and awareness: Secondary | ICD-10-CM

## 2023-11-27 DIAGNOSIS — W19XXXS Unspecified fall, sequela: Secondary | ICD-10-CM | POA: Diagnosis not present

## 2023-11-27 DIAGNOSIS — R200 Anesthesia of skin: Secondary | ICD-10-CM

## 2023-11-27 DIAGNOSIS — R479 Unspecified speech disturbances: Secondary | ICD-10-CM

## 2023-11-27 DIAGNOSIS — G950 Syringomyelia and syringobulbia: Secondary | ICD-10-CM

## 2023-11-27 DIAGNOSIS — R202 Paresthesia of skin: Secondary | ICD-10-CM

## 2023-11-27 DIAGNOSIS — R531 Weakness: Secondary | ICD-10-CM | POA: Diagnosis not present

## 2023-11-27 NOTE — Addendum Note (Signed)
 Addended by: TERRIL CHARLIES MATSU on: 11/27/2023 02:48 PM   Modules accepted: Orders

## 2023-11-27 NOTE — Patient Instructions (Addendum)
 I saw you today for your cognitive changes, speech problems, numbness, weakness, and falls.  You have had a lot of work up in the past, so I am not sure I can explain your symptoms, but it is reasonable to recheck your MRI brain and cervical spine to make sure nothing has changed.  I will be in contact when I have the results.  Please let me know if you have any questions or concerns in the meantime.  The physicians and staff at [facility] are committed to providing excellent care. You may receive a survey requesting feedback about your experience at our office. We strive to receive very good responses to the survey questions. If you feel that your experience would prevent you from giving the office a very good  response, please contact our office to try to remedy the situation. We may be reached at [phone]. Thank you for taking the time out of your busy day to complete the survey.  Venetia Potters, MD [facility]  Preventing Falls at [facility] are common, often dreaded events in the lives of older people. Aside from the obvious injuries and even death that may result, fall can cause wide-ranging consequences including loss of independence, mental decline, decreased activity and mobility. Younger people are also at risk of falling, especially those with chronic illnesses and fatigue.  Ways to reduce risk for falling Examine diet and medications. Warm foods and alcohol  dilate blood vessels, which can lead to dizziness when standing. Sleep aids, antidepressants and pain medications can also increase the likelihood of a fall.  Get a vision exam. Poor vision, cataracts and glaucoma increase the chances of falling.  Check foot gear. Shoes should fit snugly and have a sturdy, nonskid sole and a broad, low heel  Participate in a physician-approved exercise program to build and maintain muscle strength and improve balance and coordination. Programs that use ankle weights or  stretch bands are excellent for muscle-strengthening. Water  aerobics programs and low-impact Tai Chi programs have also been shown to improve balance and coordination.  Increase vitamin D  intake. Vitamin D  improves muscle strength and increases the amount of calcium the body is able to absorb and deposit in bones.  How to prevent falls from common hazards Floors - Remove all loose wires, cords, and throw rugs. Minimize clutter. Make sure rugs are anchored and smooth. Keep furniture in its usual place.  Chairs -- Use chairs with straight backs, armrests and firm seats. Add firm cushions to existing pieces to add height.  Bathroom - Install grab bars and non-skid tape in the tub or shower. Use a bathtub transfer bench or a shower chair with a back support Use an elevated toilet seat and/or safety rails to assist standing from a low surface. Do not use towel racks or bathroom tissue holders to help you stand.  Lighting - Make sure halls, stairways, and entrances are well-lit. Install a night light in your bathroom or hallway. Make sure there is a light switch at the top and bottom of the staircase. Turn lights on if you get up in the middle of the night. Make sure lamps or light switches are within reach of the bed if you have to get up during the night.  Kitchen - Install non-skid rubber mats near the sink and stove. Clean spills immediately. Store frequently used utensils, pots, pans between waist and eye level. This helps prevent reaching and bending. Sit when getting things out of lower cupboards.  Living room/  Bedrooms - Place furniture with wide spaces in between, giving enough room to move around. Establish a route through the living room that gives you something to hold onto as you walk.  Stairs - Make sure treads, rails, and rugs are secure. Install a rail on both sides of the stairs. If stairs are a threat, it might be helpful to arrange most of your activities on the lower level to reduce  the number of times you must climb the stairs.  Entrances and doorways - Install metal handles on the walls adjacent to the doorknobs of all doors to make it more secure as you travel through the doorway.  Tips for maintaining balance Keep at least one hand free at all times. Try using a backpack or fanny pack to hold things rather than carrying them in your hands. Never carry objects in both hands when walking as this interferes with keeping your balance.  Attempt to swing both arms from front to back while walking. This might require a conscious effort if Parkinson's disease has diminished your movement. It will, however, help you to maintain balance and posture, and reduce fatigue.  Consciously lift your feet off of the ground when walking. Shuffling and dragging of the feet is a common culprit in losing your balance.  When trying to navigate turns, use a U technique of facing forward and making a wide turn, rather than pivoting sharply.  Try to stand with your feet shoulder-length apart. When your feet are close together for any length of time, you increase your risk of losing your balance and falling.  Do one thing at a time. Don't try to walk and accomplish another task, such as reading or looking around. The decrease in your automatic reflexes complicates motor function, so the less distraction, the better.  Do not wear rubber or gripping soled shoes, they might catch on the floor and cause tripping.  Move slowly when changing positions. Use deliberate, concentrated movements and, if needed, use a grab bar or walking aid. Count 15 seconds between each movement. For example, when rising from a seated position, wait 15 seconds after standing to begin walking.  If balance is a continuous problem, you might want to consider a walking aid such as a cane, walking stick, or walker. Once you've mastered walking with help, you might be ready to try it on your own again.

## 2023-11-29 ENCOUNTER — Encounter: Payer: Self-pay | Admitting: Cardiovascular Disease

## 2023-11-30 ENCOUNTER — Other Ambulatory Visit: Payer: Self-pay

## 2023-11-30 ENCOUNTER — Encounter: Payer: Self-pay | Admitting: Cardiovascular Disease

## 2023-11-30 ENCOUNTER — Other Ambulatory Visit (HOSPITAL_BASED_OUTPATIENT_CLINIC_OR_DEPARTMENT_OTHER): Payer: Self-pay

## 2023-12-01 ENCOUNTER — Other Ambulatory Visit (HOSPITAL_BASED_OUTPATIENT_CLINIC_OR_DEPARTMENT_OTHER): Payer: Self-pay

## 2023-12-01 MED ORDER — FUROSEMIDE 20 MG PO TABS
20.0000 mg | ORAL_TABLET | Freq: Every day | ORAL | 3 refills | Status: AC | PRN
  Filled 2023-12-01: qty 90, 90d supply, fill #0
  Filled 2023-12-16 – 2024-01-25 (×5): qty 90, 90d supply, fill #1

## 2023-12-01 MED ORDER — FUROSEMIDE 80 MG PO TABS
80.0000 mg | ORAL_TABLET | Freq: Every day | ORAL | 3 refills | Status: DC
  Filled 2023-12-01 – 2023-12-22 (×2): qty 90, 90d supply, fill #0

## 2023-12-02 ENCOUNTER — Encounter: Payer: Self-pay | Admitting: Neurology

## 2023-12-02 DIAGNOSIS — F331 Major depressive disorder, recurrent, moderate: Secondary | ICD-10-CM | POA: Diagnosis not present

## 2023-12-04 ENCOUNTER — Ambulatory Visit: Payer: Self-pay | Admitting: Cardiovascular Disease

## 2023-12-04 DIAGNOSIS — R002 Palpitations: Secondary | ICD-10-CM | POA: Diagnosis not present

## 2023-12-07 ENCOUNTER — Other Ambulatory Visit (HOSPITAL_BASED_OUTPATIENT_CLINIC_OR_DEPARTMENT_OTHER): Payer: Self-pay

## 2023-12-07 DIAGNOSIS — D751 Secondary polycythemia: Secondary | ICD-10-CM | POA: Diagnosis not present

## 2023-12-07 DIAGNOSIS — H538 Other visual disturbances: Secondary | ICD-10-CM | POA: Diagnosis not present

## 2023-12-07 DIAGNOSIS — H35413 Lattice degeneration of retina, bilateral: Secondary | ICD-10-CM | POA: Diagnosis not present

## 2023-12-07 DIAGNOSIS — E611 Iron deficiency: Secondary | ICD-10-CM | POA: Diagnosis not present

## 2023-12-07 MED ORDER — XARELTO 20 MG PO TABS
20.0000 mg | ORAL_TABLET | Freq: Every evening | ORAL | 3 refills | Status: AC
  Filled 2023-12-07: qty 90, 90d supply, fill #0
  Filled 2024-03-09 – 2024-03-17 (×3): qty 90, 90d supply, fill #1

## 2023-12-09 DIAGNOSIS — H608X3 Other otitis externa, bilateral: Secondary | ICD-10-CM | POA: Diagnosis not present

## 2023-12-09 DIAGNOSIS — H903 Sensorineural hearing loss, bilateral: Secondary | ICD-10-CM | POA: Diagnosis not present

## 2023-12-10 ENCOUNTER — Other Ambulatory Visit (HOSPITAL_BASED_OUTPATIENT_CLINIC_OR_DEPARTMENT_OTHER): Payer: Self-pay

## 2023-12-10 ENCOUNTER — Other Ambulatory Visit: Payer: Self-pay

## 2023-12-10 ENCOUNTER — Other Ambulatory Visit: Payer: Self-pay | Admitting: Family Medicine

## 2023-12-10 MED ORDER — IBSRELA 50 MG PO TABS
50.0000 mg | ORAL_TABLET | ORAL | 1 refills | Status: AC
  Filled 2023-12-10: qty 180, 90d supply, fill #0
  Filled 2024-03-07: qty 180, 90d supply, fill #1

## 2023-12-10 MED ORDER — VITAMIN D (ERGOCALCIFEROL) 1.25 MG (50000 UNIT) PO CAPS
50000.0000 [IU] | ORAL_CAPSULE | ORAL | 1 refills | Status: AC
  Filled 2023-12-10: qty 12, 84d supply, fill #0
  Filled 2024-02-26: qty 12, 84d supply, fill #1

## 2023-12-10 MED ORDER — DESVENLAFAXINE SUCCINATE ER 50 MG PO TB24
50.0000 mg | ORAL_TABLET | Freq: Every day | ORAL | 1 refills | Status: AC
  Filled 2023-12-10: qty 90, 90d supply, fill #0
  Filled 2024-03-02: qty 90, 90d supply, fill #1

## 2023-12-11 ENCOUNTER — Encounter (HOSPITAL_BASED_OUTPATIENT_CLINIC_OR_DEPARTMENT_OTHER): Payer: Self-pay

## 2023-12-11 ENCOUNTER — Other Ambulatory Visit (HOSPITAL_BASED_OUTPATIENT_CLINIC_OR_DEPARTMENT_OTHER): Payer: Self-pay

## 2023-12-14 ENCOUNTER — Other Ambulatory Visit: Payer: Self-pay

## 2023-12-15 DIAGNOSIS — F331 Major depressive disorder, recurrent, moderate: Secondary | ICD-10-CM | POA: Diagnosis not present

## 2023-12-16 ENCOUNTER — Encounter: Admitting: Physical Medicine & Rehabilitation

## 2023-12-16 ENCOUNTER — Other Ambulatory Visit (HOSPITAL_BASED_OUTPATIENT_CLINIC_OR_DEPARTMENT_OTHER): Payer: Self-pay

## 2023-12-16 DIAGNOSIS — Z79631 Long term (current) use of antimetabolite agent: Secondary | ICD-10-CM | POA: Diagnosis not present

## 2023-12-16 DIAGNOSIS — Z79899 Other long term (current) drug therapy: Secondary | ICD-10-CM | POA: Diagnosis not present

## 2023-12-16 DIAGNOSIS — L409 Psoriasis, unspecified: Secondary | ICD-10-CM | POA: Diagnosis not present

## 2023-12-16 DIAGNOSIS — L4050 Arthropathic psoriasis, unspecified: Secondary | ICD-10-CM | POA: Diagnosis not present

## 2023-12-16 DIAGNOSIS — D751 Secondary polycythemia: Secondary | ICD-10-CM | POA: Diagnosis not present

## 2023-12-16 DIAGNOSIS — D6859 Other primary thrombophilia: Secondary | ICD-10-CM | POA: Diagnosis not present

## 2023-12-16 DIAGNOSIS — Z79620 Long term (current) use of immunosuppressive biologic: Secondary | ICD-10-CM | POA: Diagnosis not present

## 2023-12-17 ENCOUNTER — Other Ambulatory Visit (HOSPITAL_BASED_OUTPATIENT_CLINIC_OR_DEPARTMENT_OTHER): Payer: Self-pay

## 2023-12-18 ENCOUNTER — Other Ambulatory Visit: Payer: Self-pay | Admitting: Pharmacist

## 2023-12-18 ENCOUNTER — Other Ambulatory Visit: Payer: Self-pay

## 2023-12-18 ENCOUNTER — Ambulatory Visit: Attending: Family Medicine | Admitting: Pharmacist

## 2023-12-18 DIAGNOSIS — Z79899 Other long term (current) drug therapy: Secondary | ICD-10-CM

## 2023-12-18 MED ORDER — RINVOQ 15 MG PO TB24: 1.0000 | ORAL_TABLET | Freq: Every day | ORAL | 3 refills | Status: DC

## 2023-12-18 MED ORDER — RINVOQ 15 MG PO TB24
1.0000 | ORAL_TABLET | Freq: Every day | ORAL | 3 refills | Status: AC
  Filled 2023-12-18: qty 30, 30d supply, fill #0
  Filled 2024-01-11 – 2024-01-15 (×2): qty 30, 30d supply, fill #1
  Filled 2024-02-10 (×2): qty 30, 30d supply, fill #2
  Filled 2024-03-11 – 2024-03-15 (×3): qty 30, 30d supply, fill #3
  Filled 2024-04-08: qty 30, 30d supply, fill #4

## 2023-12-18 NOTE — Progress Notes (Signed)
 Tremfya  discontinued. Patient will start Rinvoq.  Pharmacy Patient Advocate Encounter  Insurance verification completed.   The patient is insured through [facility]   Ran test claim for Rinvoq. Co-pay is $0.  This test claim was processed through [facility]- copay amounts may vary at other pharmacies due to pharmacy/plan contracts, or as the patient moves through the different stages of their insurance plan.

## 2023-12-18 NOTE — Progress Notes (Signed)
  S: Patient presents today for review of their specialty medication.   Patient is currently taking Rinvoq for psoriasis. Patient is managed by Dr. Mai for this.   Dosing: Adult  Note: May be used as monotherapy or in combination with methotrexate  or other nonbiologic disease-modifying antirheumatic drugs (DMARDs); use in combination with biologic DMARDS or potent immunosuppressants (eg, azathioprine, cyclosporine ) is not recommended. Do not initiate therapy in patients with an absolute lymphocyte count <500/mm3, ANC <1,000/mm3, or hemoglobin <8 g/dL. Psoriasis: Oral: 15 mg once daily.  Adherence: has not yet started. Transitioning from Tremfya .  Efficacy: has not yet started. Transitioning from Tremfya .  Monitoring: S/sx thromboembolism: none S/sx malignancy: none  S/sx of infection: none  Current adverse effects: none   O:     Lab Results  Component Value Date   WBC 3.8 (L) 04/14/2023   HGB 14.9 04/14/2023   HCT 46.1 04/14/2023   MCV 82.0 04/14/2023   PLT 203.0 04/14/2023      Chemistry      Component Value Date/Time   NA 140 11/05/2021 1805   NA 138 02/18/2021 1427   NA 138 01/07/2017 1243   K 4.5 11/05/2021 1805   K 4.8 01/07/2017 1243   CL 100 11/05/2021 1805   CO2 28 11/05/2021 1805   CO2 25 01/07/2017 1243   BUN 18 11/05/2021 1805   BUN 11 02/18/2021 1427   BUN 18.2 01/07/2017 1243   CREATININE 1.04 11/05/2021 1805   CREATININE 1.1 01/07/2017 1243      Component Value Date/Time   CALCIUM 9.3 11/05/2021 1805   CALCIUM 9.2 01/07/2017 1243   ALKPHOS 187 (H) 11/05/2021 1805   ALKPHOS 453 (H) 01/07/2017 1243   AST 49 (H) 11/05/2021 1805   AST 57 (H) 01/07/2017 1243   ALT 36 11/05/2021 1805   ALT 78 (H) 01/07/2017 1243   BILITOT 0.7 11/05/2021 1805   BILITOT 0.4 04/29/2018 1716   BILITOT 0.58 01/07/2017 1243       Lab Results  Component Value Date   CHOL 155 04/14/2023   HDL 52.40 04/14/2023   LDLCALC 89 04/14/2023   TRIG 69.0 04/14/2023    CHOLHDL 3 04/14/2023     A/P: 1. Medication review: patient currently prescribed Rinvoq for psoriasis/psoriatic arthritis. Reviewed the medication with the patient, including the following: Rinvoq is a medication used to treat psoriasis. Administer with food. Swallow tablet whole; do not crush, split, or chew. Possible adverse effects include increased risk of infection, GI upset, hematologic toxicity, hepatic effects, lipid abnormalities, increased risk of malignancy, thromboembolism. Avoid live vaccinations. No recommendations for any changes.  Herlene Fleeta Morris, PharmD, JAQUELINE, CPP Clinical Pharmacist [facility] [phone]

## 2023-12-18 NOTE — Progress Notes (Signed)
Please see OV for complete documentation.   Butch Penny, PharmD, Patsy [place], CPP Clinical Pharmacist [facility] [phone]

## 2023-12-18 NOTE — Progress Notes (Signed)
 Specialty Pharmacy Initial Fill Coordination Note  Jesse Bowers is a 58 y.o. adult contacted today regarding initial fill of specialty medication(s) Upadacitinib (Rinvoq)   Patient requested [name] at [facility] at [place] date: 12/18/23   Medication will be filled on 10/17.   Patient is aware of $0 copayment.

## 2023-12-19 ENCOUNTER — Other Ambulatory Visit (HOSPITAL_COMMUNITY): Payer: Self-pay

## 2023-12-20 ENCOUNTER — Encounter: Payer: Self-pay | Admitting: Neurology

## 2023-12-20 ENCOUNTER — Other Ambulatory Visit: Payer: Self-pay | Admitting: Registered Nurse

## 2023-12-20 ENCOUNTER — Encounter (HOSPITAL_COMMUNITY): Payer: Self-pay

## 2023-12-20 ENCOUNTER — Encounter: Payer: Self-pay | Admitting: Physical Medicine & Rehabilitation

## 2023-12-20 DIAGNOSIS — M47816 Spondylosis without myelopathy or radiculopathy, lumbar region: Secondary | ICD-10-CM

## 2023-12-20 DIAGNOSIS — L4050 Arthropathic psoriasis, unspecified: Secondary | ICD-10-CM

## 2023-12-21 ENCOUNTER — Other Ambulatory Visit (HOSPITAL_COMMUNITY): Payer: Self-pay

## 2023-12-21 ENCOUNTER — Other Ambulatory Visit: Payer: Self-pay

## 2023-12-21 ENCOUNTER — Telehealth: Payer: Self-pay | Admitting: Registered Nurse

## 2023-12-21 ENCOUNTER — Other Ambulatory Visit (HOSPITAL_BASED_OUTPATIENT_CLINIC_OR_DEPARTMENT_OTHER): Payer: Self-pay

## 2023-12-21 DIAGNOSIS — L4050 Arthropathic psoriasis, unspecified: Secondary | ICD-10-CM

## 2023-12-21 DIAGNOSIS — G894 Chronic pain syndrome: Secondary | ICD-10-CM

## 2023-12-21 DIAGNOSIS — M47816 Spondylosis without myelopathy or radiculopathy, lumbar region: Secondary | ICD-10-CM

## 2023-12-21 MED ORDER — SUCRALFATE 1 G PO TABS
1.0000 g | ORAL_TABLET | Freq: Four times a day (QID) | ORAL | 0 refills | Status: DC
  Filled 2023-12-21: qty 56, 14d supply, fill #0

## 2023-12-21 MED ORDER — OXYCODONE HCL 10 MG PO TABS
10.0000 mg | ORAL_TABLET | Freq: Three times a day (TID) | ORAL | 0 refills | Status: DC | PRN
  Filled 2023-12-21: qty 90, 30d supply, fill #0

## 2023-12-21 MED ORDER — MORPHINE SULFATE ER 30 MG PO TBCR
30.0000 mg | EXTENDED_RELEASE_TABLET | Freq: Two times a day (BID) | ORAL | 0 refills | Status: DC
  Filled 2023-12-21 – 2023-12-26 (×3): qty 60, 30d supply, fill #0

## 2023-12-21 NOTE — Telephone Encounter (Signed)
 PMP was Reviewed Morphine  and Oxycodone  prescriptions sent to pharmacy.  [place] is aware via My-Chart message

## 2023-12-22 ENCOUNTER — Other Ambulatory Visit: Payer: Self-pay | Admitting: Physical Medicine & Rehabilitation

## 2023-12-22 ENCOUNTER — Encounter: Payer: Self-pay | Admitting: Cardiovascular Disease

## 2023-12-22 ENCOUNTER — Other Ambulatory Visit (HOSPITAL_BASED_OUTPATIENT_CLINIC_OR_DEPARTMENT_OTHER): Payer: Self-pay

## 2023-12-22 ENCOUNTER — Other Ambulatory Visit (HOSPITAL_COMMUNITY): Payer: Self-pay

## 2023-12-22 ENCOUNTER — Other Ambulatory Visit: Payer: Self-pay

## 2023-12-22 DIAGNOSIS — M47816 Spondylosis without myelopathy or radiculopathy, lumbar region: Secondary | ICD-10-CM

## 2023-12-22 DIAGNOSIS — Z888 Allergy status to other drugs, medicaments and biological substances status: Secondary | ICD-10-CM | POA: Diagnosis not present

## 2023-12-22 DIAGNOSIS — Z960 Presence of urogenital implants: Secondary | ICD-10-CM | POA: Diagnosis not present

## 2023-12-22 DIAGNOSIS — M5416 Radiculopathy, lumbar region: Secondary | ICD-10-CM

## 2023-12-22 DIAGNOSIS — F649 Gender identity disorder, unspecified: Secondary | ICD-10-CM | POA: Diagnosis not present

## 2023-12-22 DIAGNOSIS — Z882 Allergy status to sulfonamides status: Secondary | ICD-10-CM | POA: Diagnosis not present

## 2023-12-22 DIAGNOSIS — G894 Chronic pain syndrome: Secondary | ICD-10-CM

## 2023-12-22 DIAGNOSIS — Z880 Allergy status to penicillin: Secondary | ICD-10-CM | POA: Diagnosis not present

## 2023-12-22 MED ORDER — BACLOFEN 20 MG PO TABS
20.0000 mg | ORAL_TABLET | Freq: Four times a day (QID) | ORAL | 2 refills | Status: AC
  Filled 2023-12-22: qty 450, 57d supply, fill #0
  Filled 2024-01-18 – 2024-02-11 (×3): qty 450, 57d supply, fill #1
  Filled 2024-04-08: qty 450, 57d supply, fill #2

## 2023-12-22 MED ORDER — FUROSEMIDE 40 MG PO TABS
ORAL_TABLET | ORAL | 3 refills | Status: AC
  Filled 2023-12-22 – 2023-12-23 (×2): qty 125, 50d supply, fill #0
  Filled 2024-01-18 – 2024-02-02 (×2): qty 125, 50d supply, fill #1
  Filled 2024-03-28: qty 125, 50d supply, fill #2

## 2023-12-22 NOTE — Telephone Encounter (Signed)
Prescription has been updated

## 2023-12-23 ENCOUNTER — Other Ambulatory Visit (HOSPITAL_BASED_OUTPATIENT_CLINIC_OR_DEPARTMENT_OTHER): Payer: Self-pay

## 2023-12-26 ENCOUNTER — Other Ambulatory Visit (HOSPITAL_COMMUNITY): Payer: Self-pay

## 2023-12-26 ENCOUNTER — Other Ambulatory Visit (HOSPITAL_BASED_OUTPATIENT_CLINIC_OR_DEPARTMENT_OTHER): Payer: Self-pay

## 2023-12-29 ENCOUNTER — Other Ambulatory Visit: Payer: Self-pay | Admitting: Registered Nurse

## 2023-12-29 ENCOUNTER — Other Ambulatory Visit (HOSPITAL_BASED_OUTPATIENT_CLINIC_OR_DEPARTMENT_OTHER): Payer: Self-pay

## 2023-12-29 MED ORDER — ROPINIROLE HCL 0.25 MG PO TABS
0.2500 mg | ORAL_TABLET | Freq: Every day | ORAL | 1 refills | Status: DC
  Filled 2023-12-29: qty 30, 30d supply, fill #0
  Filled 2024-01-28: qty 30, 30d supply, fill #1

## 2023-12-30 ENCOUNTER — Other Ambulatory Visit (HOSPITAL_BASED_OUTPATIENT_CLINIC_OR_DEPARTMENT_OTHER): Payer: Self-pay

## 2023-12-30 DIAGNOSIS — F331 Major depressive disorder, recurrent, moderate: Secondary | ICD-10-CM | POA: Diagnosis not present

## 2023-12-31 ENCOUNTER — Other Ambulatory Visit (HOSPITAL_BASED_OUTPATIENT_CLINIC_OR_DEPARTMENT_OTHER): Payer: Self-pay

## 2023-12-31 MED ORDER — SUCRALFATE 1 G PO TABS
1.0000 g | ORAL_TABLET | Freq: Four times a day (QID) | ORAL | 0 refills | Status: DC
  Filled 2023-12-31 – 2024-01-01 (×2): qty 56, 14d supply, fill #0

## 2024-01-01 ENCOUNTER — Other Ambulatory Visit: Payer: Self-pay

## 2024-01-01 ENCOUNTER — Other Ambulatory Visit (HOSPITAL_BASED_OUTPATIENT_CLINIC_OR_DEPARTMENT_OTHER): Payer: Self-pay

## 2024-01-01 DIAGNOSIS — E611 Iron deficiency: Secondary | ICD-10-CM | POA: Diagnosis not present

## 2024-01-02 ENCOUNTER — Other Ambulatory Visit (HOSPITAL_BASED_OUTPATIENT_CLINIC_OR_DEPARTMENT_OTHER): Payer: Self-pay

## 2024-01-06 DIAGNOSIS — F331 Major depressive disorder, recurrent, moderate: Secondary | ICD-10-CM | POA: Diagnosis not present

## 2024-01-08 DIAGNOSIS — E611 Iron deficiency: Secondary | ICD-10-CM | POA: Diagnosis not present

## 2024-01-09 ENCOUNTER — Other Ambulatory Visit (HOSPITAL_BASED_OUTPATIENT_CLINIC_OR_DEPARTMENT_OTHER): Payer: Self-pay

## 2024-01-10 NOTE — Therapy (Incomplete)
 OUTPATIENT PHYSICAL THERAPY CERVICAL EVALUATION   Patient Name: Jesse Bowers MRN: [id number] DOB:[birth year], 58 y.o., adult Today's Date: 01/10/2024  END OF SESSION:   Past Medical History:  Diagnosis Date   Abnormal weight loss    Anxiety    Arthritis    Cataract    OU   Celiac disease    Cervical neck pain with evidence of disc disease    patient has a cyst    Chronic constipation    Chronic diastolic heart failure (HCC)    Pt. 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   Family history of adverse reaction to anesthesia    family has problems with anesthesia of nausea and vomiting    GERD (gastroesophageal reflux disease)    History of in 20's   Gluten enteropathy    H/O parotitis    right    Hard of hearing    History of kidney stones    History of retinal tear    Bilateral   History of staph infection    required wound vac   Hx-TIA (transient ischemic attack)    2015   Kidney stones 08/2020   LVH (left ventricular hypertrophy) 12/15/2016   Mild, noted on ECHO   MVP (mitral valve prolapse)    NAFL (nonalcoholic fatty liver)    Neck pain    Neuromuscular disorder (HCC)    bilateral neuropathy feet.   Nuclear sclerotic cataract of both eyes 09/08/2019   Pneumonia 12/17/2010   Polycythemia    Polycythemia, secondary    PONV (postoperative nausea and vomiting)    Protein C deficiency    Dr. Lanny Abide   Psoriasis    16 X10 cm psoriatic rash on sole of left foot ; open and occ scant bleeding;    psoriatic arthritis    PTSD (post-traumatic stress disorder)    Scaphoid fracture of wrist 09/23/2013   Seizure (HCC)    childhood, medication until age 38 then weanned completely off   Sleep apnea    split night study last done by Dr. 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    Wears glasses    Wears hearing aid    Past Surgical History:  Procedure Laterality Date   ABDOMINAL HYSTERECTOMY Bilateral 1994   TAH, BSO- tranverse incision at 58 yo   ANKLE ARTHROSCOPY WITH RECONSTRUCTION Right 2007   CHOLECYSTECTOMY     laparoscopic   COLONOSCOPY     x3   EYE SURGERY     Left eye 03/02/2018, right 02/15/2018   HEMORROIDECTOMY  08/19/2020   HIP ARTHROSCOPY W/ LABRAL REPAIR Right 05/11/2013   acetabular labral tear 03/30/2013   KNEE ARTHROPLASTY Right    KNEE  JOINT MANIPULATION Left    x3 under anesthesia   KNEE SURGERY Bilateral 1984   Right ACL, left PCL repair   LITHOTRIPSY  2005   LIVER BIOPSY  2013   normal results.   MASTECTOMY Bilateral    prior to 2009   MOUTH SURGERY     NASAL SEPTUM SURGERY N/A 09/20/2015   by ENT Dr. Ethyl   OVARIAN CYST SURGERY Left    size of grapefruit, was informed that she had shortened vagina   SHOULDER SURGERY Bilateral    Right 08/15/2016, Left 11/15/2016   THUMB ARTHROSCOPY Left    THYROIDECTOMY, PARTIAL Left 2008   TOTAL KNEE ARTHROPLASTY Right 08/23/2018   Procedure: TOTAL KNEE ARTHROPLASTY;  Surgeon: Melodi Lerner, MD;  Location: [facility];  Service: Orthopedics;  Laterality: Right;    TOTAL KNEE REVISION Left 02/06/2016   Procedure: LEFT TOTAL KNEE REVISION;  Surgeon: Lerner Melodi, MD;  Location: [facility];  Service: Orthopedics;  Laterality: Left;   TOTAL KNEE REVISION Left 04/22/2017   Procedure: Left knee polyethylene revision;  Surgeon: Melodi Lerner, MD;   Location: [facility];  Service: Orthopedics;  Laterality: Left;   UPPER GI ENDOSCOPY  2003   Patient Active Problem List   Diagnosis Date Noted   Thoracic spine pain 12/10/2022   Bilateral lumbar radiculopathy 10/31/2022   Lumbar disc disease 08/06/2022   Iliotibial band syndrome 08/06/2022   Non-recurrent acute suppurative otitis media of both ears without spontaneous rupture of tympanic membranes 07/09/2022   Weight gain due to medication 07/09/2022   Pancreatic insufficiency 04/09/2022   Posterior vitreous detachment of right eye 11/18/2021   Spondylosis of lumbar spine 10/03/2020   Pseudophakia of both eyes 05/07/2020   Retinal telangiectasis of both eyes 05/07/2020   Lattice degeneration of peripheral retina, left 09/08/2019   Lattice degeneration, right eye 09/08/2019   Chronic diastolic heart failure (HCC) 05/24/2019   Urinary dysfunction 04/12/2019   Osteoarthritis of right knee 08/23/2018   Constipation due to opioid therapy 03/30/2018   SNHL (sensorineural hearing loss) 12/04/2017   Abnormal urinary stream 12/03/2017   Osteoarthritis of carpometacarpal (CMC) joint of thumb 11/30/2017   Muscle weakness 11/17/2017   Transient vision disturbance 11/12/2017   Bilateral hand pain 10/30/2017   Pain of left hip joint 10/09/2017   Gynecomastia 07/10/2017   Iron deficiency anemia 07/05/2017   Spasticity 05/20/2017   Lumbar radiculitis 04/20/2017   Ulnar 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, left 11/28/2016   Idiopathic peripheral neuropathy 11/28/2016   Failed total knee arthroplasty, 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   Long term (current) use of anticoagulants 08/23/2015   Right upper quadrant abdominal pain 08/23/2015   Memory loss 05/10/2015   Gait abnormality 04/07/2015   Alkaline phosphatase elevation 04/07/2015   History of thrombosis 03/26/2015   Medial epicondylitis 02/07/2015   Cognitive decline 12/21/2014   Leukopenia 12/05/2014   Rotator cuff syndrome of right shoulder 10/27/2014   Status post  left knee replacement 08/22/2014   Left lateral epicondylitis 08/22/2014   Chronic cerebral ischemia 08/18/2014   Arthrofibrosis of knee joint 08/17/2014   Cubital canal compression syndrome, left 08/17/2014   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   Chronic pain syndrome 04/10/2014   Insomnia 04/10/2014   Chronic non-specific white matter lesions on MRI 04/10/2014   CFS (chronic fatigue syndrome) 04/10/2014   Biceps tendonitis on left 03/01/2014   Polycythemia vera (HCC) 12/27/2013   H/O TIA (transient ischemic attack) and stroke 12/27/2013   Neck pain 12/27/2013   OSA (obstructive sleep apnea) 12/08/2013  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/31/2013   Protein C deficiency (HCC) 08/01/2013   Post traumatic stress disorder (PTSD) 08/01/2013   Speech abnormality 07/25/2013   Obesity 05/10/2013   Lower extremity edema 05/10/2013   GERD (gastroesophageal reflux disease) 05/10/2013   Arthritis 05/10/2013   OA (osteoarthritis) of knee 03/15/2013   Headache 10/25/2012   Palpitations 10/18/2012   Fatty liver determined by biopsy 06/01/2012   Arthropathic psoriasis, unspecified (HCC) 04/29/2012   Abnormal liver enzymes 03/29/2012   Psoriatic arthritis (HCC) 12/29/2011   Left Renal Hydronephrosis 12/11/2010   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  Rationale for Evaluation and Treatment: Rehabilitation  ONSET DATE: ***  SUBJECTIVE:                                                                                                                                                                                                         SUBJECTIVE STATEMENT: Pt has a hx of chronic back pain.  Pt received PT in 03/24 to 04/24.  Pt was  discharged due to surgery.  Pt takes Morphine  twice per day, but tries not to take the oxycodone .  Pt has received PT over the years for different issues including aquatic therapy in the past for neuro rehab.  Pt received PT from 08/23 - 12/23 for cervicalgia and cervical spondylosis.   HEP?????   Pt was using a power W/C until August 2023 and the chair stopped working.  Pt was using the power W/C due to the TIA/CVA and LE weakness.  Pt used a walker though it bothered his wrists.  He has been walking some now without an AD.    Pt states his legs give out when he walks.  He had 1 fall in February.   Pt saw NP on 10/26/23:   His current exercise regime is walking and performing stretching exercises.  Cervical MIR has been ordered.   Hand dominance: {MISC; OT HAND DOMINANCE:[id number]}  PERTINENT HISTORY:  12/22/23 scrotoplasty, plastic operation to penis, testicular prosthesis--------clearance for PT , any restrictions??????? 2 weeks and 6 days  -- may need clearance for PT order prior to surgery.  Gender dysphoria -Chronic pain including lumbar   -CVA vs TIA in 2015 --with residual right sided weakness, sensory loss, and expressive language deficits -- (Pt reports he has spasticity),  neuropathy bilat feet, R sided heart failure and Pt uses lasix , Hx of DVT in R UE and bilat LE   -psoriasis, psoriatic arthritis which causes pain in multiple joints, OA, C4-6 syrinx and cervical spondylosis, psoriasis, and osteopenia  -lumbar spondylosis   -Pancreatic insufficiency, celiac disease, and IBS. -Anxiety and PTSD   -PSHx:  R Hip labral repair in 2015, R TKA in 2020, L TKR with 2 revisions with the last in 2018, R Ankle arthroscopy with reconstruction in 2007 6x???, Bilat shoulder surgery, R thumb replacement, and gender reconstruction surgery   PAIN:  Are you having pain? {OPRCPAIN:27236}  PRECAUTIONS: {Therapy precautions:24002}  RED FLAGS: {PT Red Flags:29287}     WEIGHT BEARING  RESTRICTIONS: {Yes ***/No:24003}  FALLS:  Has patient fallen in last 6 months? {fallsyesno:27318}  LIVING ENVIRONMENT: Lives with: {OP[facility] lives with:25569::lives with their family} Lives in: {Lives in:25570} Stairs: {opstairs:27293} Has following equipment at home: {Assistive devices:23999}  OCCUPATION: ***  PLOF: {PLOF:24004}  PATIENT GOALS: ***  NEXT MD VISIT: ***  OBJECTIVE:  Note: Objective measures were completed at Evaluation unless otherwise noted.  DIAGNOSTIC FINDINGS:  Cervical X rays 08/25: FINDINGS: Straightening of the normal cervical lordosis. Visualization through C7 on lateral view. Multilevel degenerative disc disease most pronounced C5-6 and C6-7. Prevertebral soft tissues unremarkable. Lung apices are clear. Lateral masses articulate appropriately with the dens.   IMPRESSION: Multilevel degenerative disc disease most pronounced C5-6 and C6-7.  Cervical MRI in 2023: FINDINGS: Alignment: Physiologic.   Vertebrae: Diffuse decrease of the T1 signal within the visualized spine may represent red marrow reconversion. However marrow replacement pathologies cannot be entirely excluded. This is new compared to prior MRI. No fracture or evidence of discitis.   Cord: Stable appearance elevated focus of increased T2 signal within the spinal cord extending from the C4 to the C6 level measuring up to 35 mm in length and 4 mm in maximal diameter at the C5 level, consistent with syrinx.   Posterior Fossa, vertebral arteries, paraspinal tissues: Negative.   Disc levels:   C2-3: No spinal canal or neural foraminal stenosis.   C3-4:Facet degenerative change resulting mild-to-moderate left neural foraminal narrowing, unchanged. No spinal canal stenosis.   C4-5: Minimal disc bulge. Uncovertebral and facet degenerative changes resulting in mild right neural foraminal narrowing. No spinal canal stenosis.   C5-6:Minimal disc bulge. Uncovertebral and facet  degenerative changes resulting in mild right neural foraminal narrowing. No spinal canal stenosis.   C6-7: Minimal disc bulge. No spinal canal or neural foraminal stenosis.   C7-T1: No spinal canal or neural foraminal stenosis.   IMPRESSION: 1. No interval change of the syrinx spanning the C4 through C6 levels measuring up to 4 mm in diameter. 2. Mild degenerative changes of the cervical spine without high-grade spinal canal stenosis at any level. 3. Mild to moderate left neural foraminal narrowing at C3-4, unchanged.     PATIENT SURVEYS:  {rehab surveys:24030}  COGNITION: Overall cognitive status: {cognition:24006}  SENSATION: {sensation:27233}  POSTURE: {posture:25561}  PALPATION: ***   CERVICAL ROM:   {AROM/PROM:27142} ROM A/PROM (deg) eval  Flexion   Extension   Right lateral flexion   Left lateral flexion   Right rotation   Left rotation    (Blank rows = not tested)  UPPER EXTREMITY ROM:  {AROM/PROM:27142} ROM Right eval Left eval  Shoulder flexion    Shoulder extension    Shoulder abduction    Shoulder adduction    Shoulder extension    Shoulder internal rotation  Shoulder external rotation    Elbow flexion    Elbow extension    Wrist flexion    Wrist extension    Wrist ulnar deviation    Wrist radial deviation    Wrist pronation    Wrist supination     (Blank rows = not tested)  UPPER EXTREMITY MMT:  MMT Right eval Left eval  Shoulder flexion    Shoulder extension    Shoulder abduction    Shoulder adduction    Shoulder extension    Shoulder internal rotation    Shoulder external rotation    Middle trapezius    Lower trapezius    Elbow flexion    Elbow extension    Wrist flexion    Wrist extension    Wrist ulnar deviation    Wrist radial deviation    Wrist pronation    Wrist supination    Grip strength     (Blank rows = not tested)  CERVICAL SPECIAL TESTS:  {Cervical special tests:25246}  FUNCTIONAL TESTS:   {Functional tests:24029}  TREATMENT DATE: ***                                                                                                                                 PATIENT EDUCATION:  Education details: *** Person educated: {Person educated:25204} Education method: {Education Method:25205} Education comprehension: {Education Comprehension:25206}  HOME EXERCISE PROGRAM: ***  ASSESSMENT:  CLINICAL IMPRESSION: Patient is a *** y.o. *** who was seen today for physical therapy evaluation and treatment for ***.   OBJECTIVE IMPAIRMENTS: {opptimpairments:25111}.   ACTIVITY LIMITATIONS: {activitylimitations:27494}  PARTICIPATION LIMITATIONS: {participationrestrictions:25113}  PERSONAL FACTORS: {Personal factors:25162} are also affecting patient's functional outcome.   REHAB POTENTIAL: {rehabpotential:25112}  CLINICAL DECISION MAKING: {clinical decision making:25114}  EVALUATION COMPLEXITY: {Evaluation complexity:25115}   GOALS: Goals reviewed with patient? {yes/no:20286}  SHORT TERM GOALS: Target date: ***  *** Baseline:  Goal status: INITIAL  2.  *** Baseline:  Goal status: INITIAL  3.  *** Baseline:  Goal status: INITIAL  4.  *** Baseline:  Goal status: INITIAL  5.  *** Baseline:  Goal status: INITIAL  6.  *** Baseline:  Goal status: INITIAL  LONG TERM GOALS: Target date: ***  *** Baseline:  Goal status: INITIAL  2.  *** Baseline:  Goal status: INITIAL  3.  *** Baseline:  Goal status: INITIAL  4.  *** Baseline:  Goal status: INITIAL  5.  *** Baseline:  Goal status: INITIAL  6.  *** Baseline:  Goal status: INITIAL   PLAN:  PT FREQUENCY: {rehab frequency:25116}  PT DURATION: {rehab duration:25117}  PLANNED INTERVENTIONS: {rehab planned interventions:25118::97110-Therapeutic exercises,97530- Therapeutic [phone]- Neuromuscular re-education,97535- Self Rjmz,02859- Manual therapy,Patient/Family  education}  PLAN FOR NEXT SESSION: PIERRETTE Mose Minerva, PT 01/10/2024, 9:12 AM

## 2024-01-11 ENCOUNTER — Other Ambulatory Visit (HOSPITAL_BASED_OUTPATIENT_CLINIC_OR_DEPARTMENT_OTHER): Payer: Self-pay

## 2024-01-11 ENCOUNTER — Encounter (HOSPITAL_BASED_OUTPATIENT_CLINIC_OR_DEPARTMENT_OTHER): Payer: Self-pay

## 2024-01-11 ENCOUNTER — Ambulatory Visit (HOSPITAL_BASED_OUTPATIENT_CLINIC_OR_DEPARTMENT_OTHER): Admitting: Physical Therapy

## 2024-01-11 ENCOUNTER — Other Ambulatory Visit: Payer: Self-pay

## 2024-01-13 ENCOUNTER — Other Ambulatory Visit: Payer: Self-pay

## 2024-01-13 ENCOUNTER — Other Ambulatory Visit (HOSPITAL_BASED_OUTPATIENT_CLINIC_OR_DEPARTMENT_OTHER): Payer: Self-pay

## 2024-01-13 ENCOUNTER — Encounter: Payer: Self-pay | Admitting: Physical Medicine & Rehabilitation

## 2024-01-13 ENCOUNTER — Encounter: Attending: Physical Medicine & Rehabilitation | Admitting: Physical Medicine & Rehabilitation

## 2024-01-13 VITALS — BP 124/81 | HR 98 | Ht 68.0 in | Wt 213.8 lb

## 2024-01-13 DIAGNOSIS — M5416 Radiculopathy, lumbar region: Secondary | ICD-10-CM | POA: Insufficient documentation

## 2024-01-13 DIAGNOSIS — M25579 Pain in unspecified ankle and joints of unspecified foot: Secondary | ICD-10-CM | POA: Insufficient documentation

## 2024-01-13 DIAGNOSIS — G609 Hereditary and idiopathic neuropathy, unspecified: Secondary | ICD-10-CM | POA: Insufficient documentation

## 2024-01-13 DIAGNOSIS — M47816 Spondylosis without myelopathy or radiculopathy, lumbar region: Secondary | ICD-10-CM | POA: Insufficient documentation

## 2024-01-13 DIAGNOSIS — Z79891 Long term (current) use of opiate analgesic: Secondary | ICD-10-CM | POA: Diagnosis not present

## 2024-01-13 DIAGNOSIS — G894 Chronic pain syndrome: Secondary | ICD-10-CM | POA: Insufficient documentation

## 2024-01-13 DIAGNOSIS — Z5181 Encounter for therapeutic drug level monitoring: Secondary | ICD-10-CM | POA: Diagnosis not present

## 2024-01-13 DIAGNOSIS — F331 Major depressive disorder, recurrent, moderate: Secondary | ICD-10-CM | POA: Diagnosis not present

## 2024-01-13 DIAGNOSIS — T402X5A Adverse effect of other opioids, initial encounter: Secondary | ICD-10-CM | POA: Insufficient documentation

## 2024-01-13 DIAGNOSIS — K5903 Drug induced constipation: Secondary | ICD-10-CM | POA: Insufficient documentation

## 2024-01-13 MED ORDER — MORPHINE SULFATE ER 30 MG PO TBCR
30.0000 mg | EXTENDED_RELEASE_TABLET | Freq: Two times a day (BID) | ORAL | 0 refills | Status: DC
  Filled 2024-01-13 – 2024-01-18 (×2): qty 60, 30d supply, fill #0

## 2024-01-13 MED ORDER — OXYCODONE HCL 10 MG PO TABS
10.0000 mg | ORAL_TABLET | Freq: Three times a day (TID) | ORAL | 0 refills | Status: AC | PRN
  Filled 2024-01-13 – 2024-03-07 (×2): qty 90, 30d supply, fill #0

## 2024-01-13 NOTE — Patient Instructions (Signed)
 TRY VOLTAREN  GEL 3-4X PER DAY TO BOTH FEET.   ?INSOLES WITH LATERAL FLARE TO HELP WITH YOUR HYPO-PRONATION.

## 2024-01-13 NOTE — Progress Notes (Signed)
 Subjective:    Patient ID: Jesse Bowers, adult    DOB: 1965-07-24, 58 y.o.   MRN: [id number]  HPI  Jesse Bowers is here in follow up his chronic pain. I last saw him in April. His biggest complaint is pain along the outside of both his feet. He describes the pain as sharp. It hurts to bear weight and with minimal pressure along outside of feet. He says it feels different than the neuropathy in his feet. It's been going on for a few months. He is wearing splints at night to keep his feet straight at night. Ice and cold help. He had inserts put in his shoes which made no difference. They were just flat and cushioned.     He has a sleep study scheduled soon as he's got a central component to his apnea but also obstructive. His current device won't treat obstructive.   He's got therapy upcoming for his neck to work on ROM.    Jesse Bowers remains on ms contin  30mg  q12 and oxycodone  10mg  q8 prn for pain control.   He moves his bowels daily but is unsure if he's completely emptying.  He has had evening and morning nausea.  GI gave him Carafate which did not help very much.   Pain Inventory Average Pain 7 Pain Right Now 6 My pain is constant, sharp, burning, tingling, and aching  In the last 24 hours, has pain interfered with the following? General activity 7 Relation with others 6 Enjoyment of life 8 What TIME of day is your pain at its worst? morning  and evening Sleep (in general) Poor  Pain is worse with: walking, standing, and some activites Pain improves with: heat/ice, therapy/exercise, medication, TENS, and injections Relief from Meds: 8  Family History  Problem Relation Age of Onset   Stroke Maternal Grandfather        67   Heart attack Maternal Grandfather    Glaucoma Maternal Grandfather    Macular degeneration Maternal Grandfather    Breast cancer Sister    Hypertension Mother    Psoriasis Mother    Other Mother        meningioma developed ~2019   Glaucoma Mother     Cancer Paternal Grandfather    Heart attack Paternal Grandfather    Stroke Paternal Uncle        age 32   Polycythemia Paternal Uncle    Stroke Maternal Grandmother    Congestive Heart Failure Maternal Grandmother    Heart attack Maternal Grandmother    Protein C deficiency Sister 24       Miscarriages   Breast cancer Maternal Aunt 35   Social History   Socioeconomic History   Marital status: Married    Spouse name: Not on file   Number of children: 2   Years of education: 4y college   Highest education level: Master's degree (e.g., MA, MS, MEng, MEd, MSW, MBA)  Occupational History   Occupation: Restaurant Manager, [employer]    Comment: Not working since CVA 2015  Tobacco Use   Smoking status: Never   Smokeless tobacco: Never  Vaping Use   Vaping status: Never Used  Substance and Sexual Activity   Alcohol  use: Yes    Comment: social   Drug use: No   Sexual activity: Yes    Birth control/protection: None    Comment: patient is a transgender on testosterone  shots, no biological kids  Other Topics Concern   Not on file  Social History Narrative  Are you right handed or left handed? right   Are you currently employed ?    What is your current occupation? disabled   Do you live at home alone?   Who lives with you? Wife and 2 dogs   What type of home do you live in: 1 story or 2 story? two   Caffiene 1 soda a day    Social Drivers of Corporate Investment Banker Strain: Low Risk  (03/12/2023)   Overall Financial Resource Strain (CARDIA)    Difficulty of Paying Living Expenses: Not very hard  Recent Concern: Financial Resource Strain - High Risk (12/12/2022)   Overall Financial Resource Strain (CARDIA)    Difficulty of Paying Living Expenses: Hard  Food Insecurity: Low Risk  (09/18/2023)   Received from [facility]   Hunger Vital Sign    Within the past 12 months, you worried that your food would run out before you got money to buy more: Never true    Within the past  12 months, the food you bought just didn't last and you didn't have money to get more. : Never true  Transportation Needs: No Transportation Needs (09/18/2023)   Received from [facility]    In the past 12 months, has lack of reliable transportation kept you from medical appointments, meetings, work or from getting things needed for daily living? : No  Physical Activity: Unknown (03/12/2023)   Exercise Vital Sign    Days of Exercise per Week: 0 days    Minutes of Exercise per Session: Not on file  Stress: Stress Concern Present (03/12/2023)   [employer] of Occupational Health - Occupational Stress Questionnaire    Feeling of Stress : Rather much  Social Connections: Socially Isolated (03/12/2023)   Social Connection and Isolation Panel    Frequency of Communication with Friends and Family: Never    Frequency of Social Gatherings with Friends and Family: Never    Attends Religious Services: Never    Database Administrator or Organizations: No    Attends Engineer, Structural: Not on file    Marital Status: Married   Past Surgical History:  Procedure Laterality Date   ABDOMINAL HYSTERECTOMY Bilateral 1994   TAH, BSO- tranverse incision at 58 yo   ANKLE ARTHROSCOPY WITH RECONSTRUCTION Right 2007   CHOLECYSTECTOMY     laparoscopic   COLONOSCOPY     x3   EYE SURGERY     Left eye 03/02/2018, right 02/15/2018   HEMORROIDECTOMY  08/19/2020   HIP ARTHROSCOPY W/ LABRAL REPAIR Right 05/11/2013   acetabular labral tear 03/30/2013   KNEE ARTHROPLASTY Right    KNEE JOINT MANIPULATION Left    x3 under anesthesia   KNEE SURGERY Bilateral 1984   Right ACL, left PCL repair   LITHOTRIPSY  2005   LIVER BIOPSY  2013   normal results.   MASTECTOMY Bilateral    prior to 2009   MOUTH SURGERY     NASAL SEPTUM SURGERY N/A 09/20/2015   by ENT Dr. Ethyl   OVARIAN CYST SURGERY Left    size of grapefruit, was informed that she had shortened vagina   SHOULDER SURGERY  Bilateral    Right 08/15/2016, Left 11/15/2016   THUMB ARTHROSCOPY Left    THYROIDECTOMY, PARTIAL Left 2008   TOTAL KNEE ARTHROPLASTY Right 08/23/2018   Procedure: TOTAL KNEE ARTHROPLASTY;  Surgeon: Melodi Lerner, MD;  Location: [facility];  Service: Orthopedics;  Laterality: Right;   TOTAL KNEE REVISION Left 02/06/2016   Procedure: LEFT TOTAL KNEE REVISION;  Surgeon: Dempsey Moan, MD;  Location: [facility];  Service: Orthopedics;  Laterality: Left;   TOTAL KNEE REVISION Left 04/22/2017   Procedure: Left knee polyethylene revision;  Surgeon: Moan Dempsey, MD;  Location: [facility];  Service: Orthopedics;  Laterality: Left;   UPPER GI ENDOSCOPY  2003   Past Surgical History:  Procedure Laterality Date   ABDOMINAL HYSTERECTOMY Bilateral 1994   TAH, BSO- tranverse incision at 58 yo   ANKLE ARTHROSCOPY WITH RECONSTRUCTION Right 2007   CHOLECYSTECTOMY     laparoscopic   COLONOSCOPY     x3   EYE SURGERY     Left eye 03/02/2018, right 02/15/2018   HEMORROIDECTOMY  08/19/2020   HIP ARTHROSCOPY W/ LABRAL REPAIR Right 05/11/2013   acetabular labral tear 03/30/2013   KNEE ARTHROPLASTY Right    KNEE JOINT MANIPULATION Left    x3 under anesthesia   KNEE SURGERY Bilateral 1984   Right ACL, left PCL repair   LITHOTRIPSY  2005   LIVER BIOPSY  2013   normal results.   MASTECTOMY Bilateral    prior to 2009   MOUTH SURGERY     NASAL SEPTUM SURGERY N/A 09/20/2015   by ENT Dr. Ethyl   OVARIAN CYST SURGERY Left    size of grapefruit, was informed that she had shortened vagina   SHOULDER SURGERY Bilateral    Right 08/15/2016, Left 11/15/2016   THUMB ARTHROSCOPY Left    THYROIDECTOMY, PARTIAL Left 2008   TOTAL KNEE ARTHROPLASTY Right 08/23/2018   Procedure: TOTAL KNEE ARTHROPLASTY;  Surgeon: Moan Dempsey, MD;  Location: [facility];  Service: Orthopedics;  Laterality: Right;    TOTAL KNEE REVISION Left 02/06/2016   Procedure: LEFT TOTAL KNEE REVISION;  Surgeon: Dempsey Moan, MD;  Location: [facility];  Service: Orthopedics;  Laterality: Left;   TOTAL KNEE REVISION Left 04/22/2017   Procedure: Left knee polyethylene revision;  Surgeon: Moan Dempsey, MD;  Location: [facility];  Service: Orthopedics;  Laterality: Left;   UPPER GI ENDOSCOPY  2003   Past Medical History:  Diagnosis Date   Abnormal weight loss    Anxiety    Arthritis    Cataract    OU   Celiac disease    Cervical neck pain with evidence of disc disease    patient has a cyst    Chronic constipation    Chronic diastolic heart failure (HCC)    Pt. 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   Family history of adverse reaction to anesthesia    family has problems with anesthesia of nausea and vomiting    GERD (gastroesophageal reflux disease)    History of in 20's   Gluten enteropathy    H/O parotitis    right    Hard of hearing    History of kidney stones    History of retinal tear    Bilateral   History of staph infection    required wound vac   Hx-TIA (transient ischemic attack)    2015   Kidney stones 08/2020   LVH (left ventricular hypertrophy) 12/15/2016   Mild, noted on ECHO   MVP (mitral valve prolapse)  NAFL (nonalcoholic fatty liver)    Neck pain    Neuromuscular disorder (HCC)    bilateral neuropathy feet.   Nuclear sclerotic cataract of both eyes 09/08/2019   Pneumonia 12/17/2010   Polycythemia    Polycythemia, secondary    PONV (postoperative nausea and vomiting)    Protein C deficiency    Dr. Lanny Abide   Psoriasis    16 X10 cm psoriatic rash on sole of left foot ; open and occ scant bleeding;    psoriatic arthritis    PTSD (post-traumatic stress disorder)    Scaphoid fracture of wrist 09/23/2013   Seizure (HCC)    childhood, medication until age 64 then weanned  completely off   Sleep apnea    split night study last done by Dr. Vear 06/18/15 shows severe OSA, CSA, and hypersomnia, rec bipap   Splenomegaly    Stenosis of ureteropelvic junction (UPJ)    left   Stroke [facility])    CVA vs TIA in left cerebrum causing slight right sided weakness-Dr. 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    Wears glasses    Wears hearing aid    BP 124/81 (BP Location: Left Arm, Patient Position: Sitting, Cuff Size: Large)   Pulse 98   Ht 5' 8 (1.727 m)   Wt 213 lb 12.8 oz (97 kg)   SpO2 96%   BMI 32.51 kg/m   Opioid Risk Score:   Fall Risk Score:  `1  Depression screen [facility] 2/9     01/13/2024   11:01 AM 10/26/2023    1:13 PM 08/10/2023    1:12 PM 04/14/2023    9:23 AM 01/16/2023    2:31 PM 12/16/2022   12:59 PM 12/10/2022   10:48 AM  Depression screen PHQ 2/9  Decreased Interest 0 0 0 1 1 1  0  Down, Depressed, Hopeless 0 0 0 1 2 1  0  PHQ - 2 Score 0 0 0 2 3 2  0  Altered sleeping 3   3 3 1    Tired, decreased energy 2   1 1 1    Change in appetite 0   0 0 1   Feeling bad or failure about yourself  0   0 3 0   Trouble concentrating 0   1 3 1    Moving slowly or fidgety/restless 0   0 0 0   Suicidal thoughts 0   0 0 0   PHQ-9 Score 5   7  13  6     Difficult doing work/chores    Somewhat difficult Very difficult Somewhat difficult      Data saved with a previous flowsheet row definition      Review of Systems  Musculoskeletal:  Positive for arthralgias, back pain, myalgias and neck pain.       Neck pain, bilateral shoulder pain, low back pain and widespread back pain, bilateral hand and foot pain, post operative pain  All other systems reviewed and are negative.      Objective:   Physical Exam General: No acute distress HEENT: NCAT, EOMI, oral  membranes moist Cards: reg rate  Chest: normal effort Abdomen: Soft, NT, ND Skin: dry, intact Extremities: no edema Psych: pleasant and appropriate  Skin: intact Neuro:  Alert and oriented x 3. Normal insight and awareness. Intact Memory. Normal language and speech. Cranial nerve exam unremarkable. LE motor 5/5 LLE and 4 to 4+ out of 5 in the right lower extremity with perhaps a bit more weakness still in the right ankle.  He did generally seem stronger today than he has in prior motor testing exams.  He has stocking glove sensory loss in both feet below ankles as well as sensory loss along left lateral leg. Right 2-4 toes remain more affected.  SABRA  He continues with his cane for balance and to help offload his left leg to a certain extent. Musculoskeletal: both feet hypo-pronate, left more than right. He has pain with palpation of his 5th phalanges bilaterally.             Assessment & Plan:  1. Psoriatic arthritis with pain in multiple areas, most prominently feet, hands, elbows. Pain also related to his prior CVA and associated motor/sensory change   2. 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 3. Hx of left total knee revision again on 04/21/17 per [facility] and right TKA 08/2018 thru [facility].              -popliteal swelling on left, baker's cyst?             -knee seems a little better.  Range of motion is improved 4. Chronic  low back pain---MRI with severe DDD at L5-S1. Left S1 radiculopathy? only mild foraminal stenosis however -S/P left translaminar L5-S1 ESI by Dr. Carilyn.   -improved pain control -continue HEP -nexwave --TENS for back pain   5. Protein C deficiency   6. Left shoulder subluxation, bicipital tendonitis 7. Central sleep apnea 8. Chronic opioid use for pain             -continue ms contin  30mg  q12 hours. Today RF  #60              -continue oxycodone  10mg  q8 prn #90--continue for now             We will continue the controlled substance monitoring program, this consists of regular clinic visits, examinations, routine drug screening, pill counts as well as use of [place]  Controlled Substance Reporting System. [place].   -UDS today 9. Depression with anxiety.  0. Left lateral epidondylitis  11. 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  has had right and left sided weakness with sensory loss in C4-6 dermatomes.. Most recent cervical MRI without significant change from 2019 study                            -neuro exam stable to improved 06/10/23. 12. 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 [facility]  - Discussed the importance of being more aggressive with his bowel regimen.  I suspect some of his nausea is related to his incomplete emptying/constipation. 14. Right wrist psoriatic arthritis, tendonapthy, ganglion cysts s/p multiple recent surgeries.  s/p right thumb replacement             -is followed by Dr. Ahmad at [facility]--             -arthroscopic surgery right wrist complete 15. Recent gender reconstruction surgery--seems to be progressing well.  16. Bilateral lateral foot pain, ? Due to supination/under-pronation d/t multiple neurological  issues more than likely and associate weakness in ankle dorsiflexors, evertors, foot intrinsics.   -voltaren  gel tid to qid  -custom insoles with ?lateral flare. Made referral to [facility]  -had xrays of left foot in february which were unremarkable   20 minutes of face to face patient care time were spent during this visit. All questions were encouraged and answered. Follow up with NP in about 2 months.

## 2024-01-14 DIAGNOSIS — F411 Generalized anxiety disorder: Secondary | ICD-10-CM | POA: Diagnosis not present

## 2024-01-14 DIAGNOSIS — F09 Unspecified mental disorder due to known physiological condition: Secondary | ICD-10-CM | POA: Diagnosis not present

## 2024-01-14 DIAGNOSIS — F4310 Post-traumatic stress disorder, unspecified: Secondary | ICD-10-CM | POA: Diagnosis not present

## 2024-01-14 DIAGNOSIS — F5101 Primary insomnia: Secondary | ICD-10-CM | POA: Diagnosis not present

## 2024-01-14 DIAGNOSIS — F32A Depression, unspecified: Secondary | ICD-10-CM | POA: Diagnosis not present

## 2024-01-15 ENCOUNTER — Other Ambulatory Visit: Payer: Self-pay

## 2024-01-15 DIAGNOSIS — E611 Iron deficiency: Secondary | ICD-10-CM | POA: Diagnosis not present

## 2024-01-15 NOTE — Progress Notes (Signed)
 Specialty Pharmacy Refill Coordination Note  Camp Gopal is a 58 y.o. adult contacted today regarding refills of specialty medication(s) Upadacitinib (Rinvoq)   Patient requested Delivery   Delivery date: 01/19/24   Verified address: [address]   [place] [id number]   Medication will be filled on: 01/18/24

## 2024-01-18 ENCOUNTER — Ambulatory Visit (HOSPITAL_BASED_OUTPATIENT_CLINIC_OR_DEPARTMENT_OTHER): Admitting: Physical Therapy

## 2024-01-18 ENCOUNTER — Other Ambulatory Visit: Payer: Self-pay

## 2024-01-18 ENCOUNTER — Other Ambulatory Visit (HOSPITAL_BASED_OUTPATIENT_CLINIC_OR_DEPARTMENT_OTHER): Payer: Self-pay | Admitting: Cardiovascular Disease

## 2024-01-18 ENCOUNTER — Other Ambulatory Visit (HOSPITAL_BASED_OUTPATIENT_CLINIC_OR_DEPARTMENT_OTHER): Payer: Self-pay

## 2024-01-18 ENCOUNTER — Encounter (HOSPITAL_BASED_OUTPATIENT_CLINIC_OR_DEPARTMENT_OTHER): Payer: Self-pay

## 2024-01-18 ENCOUNTER — Encounter: Payer: Self-pay | Admitting: Cardiovascular Disease

## 2024-01-18 ENCOUNTER — Encounter: Payer: Self-pay | Admitting: Physical Medicine & Rehabilitation

## 2024-01-18 DIAGNOSIS — E875 Hyperkalemia: Secondary | ICD-10-CM

## 2024-01-18 DIAGNOSIS — G894 Chronic pain syndrome: Secondary | ICD-10-CM

## 2024-01-18 DIAGNOSIS — M47816 Spondylosis without myelopathy or radiculopathy, lumbar region: Secondary | ICD-10-CM

## 2024-01-18 DIAGNOSIS — F649 Gender identity disorder, unspecified: Secondary | ICD-10-CM | POA: Diagnosis not present

## 2024-01-18 LAB — DRUG TOX MONITOR 1 W/CONF, ORAL FLD
Amphetamines: NEGATIVE ng/mL (ref <10)
Barbiturates: NEGATIVE ng/mL (ref <10)
Benzodiazepines: NEGATIVE ng/mL (ref <0.50)
Buprenorphine: NEGATIVE ng/mL (ref <0.10)
Cocaine: NEGATIVE ng/mL (ref <5.0)
Codeine: NEGATIVE ng/mL (ref <2.5)
Dihydrocodeine: NEGATIVE ng/mL (ref <2.5)
Fentanyl: NEGATIVE ng/mL (ref <0.10)
Heroin Metabolite: NEGATIVE ng/mL (ref <1.0)
Hydrocodone: NEGATIVE ng/mL (ref <2.5)
Hydromorphone: NEGATIVE ng/mL (ref <2.5)
MARIJUANA: NEGATIVE ng/mL (ref <2.5)
MDMA: NEGATIVE ng/mL (ref <10)
Meprobamate: NEGATIVE ng/mL (ref <2.5)
Methadone: NEGATIVE ng/mL (ref <5.0)
Morphine: 22.8 ng/mL — ABNORMAL HIGH (ref <2.5)
Nicotine Metabolite: NEGATIVE ng/mL (ref <5.0)
Norhydrocodone: NEGATIVE ng/mL (ref <2.5)
Noroxycodone: 14.4 ng/mL — ABNORMAL HIGH (ref <2.5)
Opiates: POSITIVE ng/mL — AB (ref <2.5)
Oxycodone: 62.2 ng/mL — ABNORMAL HIGH (ref <2.5)
Oxymorphone: NEGATIVE ng/mL (ref <2.5)
Phencyclidine: NEGATIVE ng/mL (ref <10)
Tapentadol: NEGATIVE ng/mL (ref <5.0)
Tramadol: NEGATIVE ng/mL (ref <5.0)
Zolpidem: NEGATIVE ng/mL (ref <5.0)

## 2024-01-18 LAB — DRUG TOX ALC METAB W/CON, ORAL FLD: Alcohol Metabolite: NEGATIVE ng/mL (ref <25)

## 2024-01-18 MED ORDER — MORPHINE SULFATE ER 30 MG PO TBCR
30.0000 mg | EXTENDED_RELEASE_TABLET | Freq: Two times a day (BID) | ORAL | 0 refills | Status: AC
  Filled 2024-01-18 – 2024-01-22 (×2): qty 60, 30d supply, fill #0

## 2024-01-18 MED ORDER — POTASSIUM CHLORIDE ER 10 MEQ PO TBCR
20.0000 meq | EXTENDED_RELEASE_TABLET | Freq: Every day | ORAL | 3 refills | Status: AC
  Filled 2024-01-18: qty 180, 90d supply, fill #0

## 2024-01-18 NOTE — Telephone Encounter (Signed)
 New rx sent for 11/20

## 2024-01-19 DIAGNOSIS — D751 Secondary polycythemia: Secondary | ICD-10-CM | POA: Diagnosis not present

## 2024-01-19 DIAGNOSIS — K900 Celiac disease: Secondary | ICD-10-CM | POA: Diagnosis not present

## 2024-01-19 DIAGNOSIS — K219 Gastro-esophageal reflux disease without esophagitis: Secondary | ICD-10-CM | POA: Diagnosis not present

## 2024-01-19 NOTE — Telephone Encounter (Signed)
 Potassium on 01/08/2024 was 4.8 which is mildly high by their lab standards (upper limit of normal 4.5).  He does not need to take more potassium.  I would continue the same dose of potassium supplement and furosemide  and simply recheck a basic metabolic panel in about 2 weeks.  Sometimes the potassium levels may be spuriously increased due to hemolysis, would like to reassess first before we make any permanent changes to his regimen.

## 2024-01-20 ENCOUNTER — Other Ambulatory Visit (HOSPITAL_BASED_OUTPATIENT_CLINIC_OR_DEPARTMENT_OTHER): Payer: Self-pay

## 2024-01-20 MED ORDER — METHOTREXATE SODIUM CHEMO INJECTION 50 MG/2ML
25.0000 mg | INTRAMUSCULAR | 0 refills | Status: DC
  Filled 2024-01-20: qty 12, 84d supply, fill #0

## 2024-01-21 ENCOUNTER — Other Ambulatory Visit (HOSPITAL_BASED_OUTPATIENT_CLINIC_OR_DEPARTMENT_OTHER): Payer: Self-pay

## 2024-01-21 ENCOUNTER — Ambulatory Visit: Admitting: Family Medicine

## 2024-01-21 ENCOUNTER — Ambulatory Visit (INDEPENDENT_AMBULATORY_CARE_PROVIDER_SITE_OTHER): Admitting: Family Medicine

## 2024-01-21 ENCOUNTER — Encounter: Payer: Self-pay | Admitting: Family Medicine

## 2024-01-21 VITALS — BP 102/78 | HR 100 | Temp 98.6°F | Ht 68.0 in | Wt 210.7 lb

## 2024-01-21 DIAGNOSIS — F321 Major depressive disorder, single episode, moderate: Secondary | ICD-10-CM

## 2024-01-21 DIAGNOSIS — T50905A Adverse effect of unspecified drugs, medicaments and biological substances, initial encounter: Secondary | ICD-10-CM

## 2024-01-21 DIAGNOSIS — Z8679 Personal history of other diseases of the circulatory system: Secondary | ICD-10-CM

## 2024-01-21 DIAGNOSIS — R635 Abnormal weight gain: Secondary | ICD-10-CM

## 2024-01-21 DIAGNOSIS — L400 Psoriasis vulgaris: Secondary | ICD-10-CM | POA: Diagnosis not present

## 2024-01-21 MED ORDER — CLOBETASOL PROPIONATE 0.05 % EX CREA
1.0000 | TOPICAL_CREAM | Freq: Two times a day (BID) | CUTANEOUS | 2 refills | Status: AC
  Filled 2024-01-21: qty 30, 30d supply, fill #0
  Filled 2024-02-15: qty 30, 30d supply, fill #1
  Filled 2024-03-15: qty 30, 30d supply, fill #2

## 2024-01-21 MED ORDER — DESONIDE 0.05 % EX OINT
1.0000 | TOPICAL_OINTMENT | Freq: Two times a day (BID) | CUTANEOUS | 2 refills | Status: AC
  Filled 2024-01-21: qty 60, 30d supply, fill #0
  Filled 2024-02-27: qty 60, 30d supply, fill #1
  Filled 2024-03-28 – 2024-04-01 (×2): qty 60, 30d supply, fill #2

## 2024-01-21 MED ORDER — METFORMIN HCL 500 MG PO TABS
1000.0000 mg | ORAL_TABLET | Freq: Two times a day (BID) | ORAL | 1 refills | Status: AC
  Filled 2024-01-21 – 2024-03-28 (×2): qty 360, 90d supply, fill #0

## 2024-01-21 NOTE — Progress Notes (Deleted)
 Complete physical exam  Patient: Jesse Bowers   DOB: January 10, 1966   58 y.o. Adult  MRN: [id number]  Subjective:    Chief Complaint  Patient presents with   Medical Management of Chronic Issues    Jesse Bowers is a 58 y.o. adult who presents today for a complete physical exam. He reports consuming a {diet types:17450} diet. {types:19826} He generally feels {DESC; WELL/FAIRLY WELL/POORLY:18703}. He reports sleeping {DESC; WELL/FAIRLY WELL/POORLY:18703}. He {does/does not:200015} have additional problems to discuss today.    Most recent fall risk assessment:    01/13/2024   11:01 AM  Fall Risk   Falls in the past year? 1  Number falls in past yr: 0  Injury with Fall? 0     Most recent depression screenings:    01/13/2024   11:01 AM 10/26/2023    1:13 PM  PHQ 2/9 Scores  PHQ - 2 Score 0 0  PHQ- 9 Score 5     {VISON DENTAL STD PSA (Optional):27386}  {History (Optional):23778}  Patient Care Team: Ozell Heron HERO, MD as PCP - General (Family Medicine) Croitoru, Jerel, MD as PCP - Cardiology (Cardiology) Towana Fonda RAMAN, MD as Referring Physician (Dermatology) Enzo, Elza Mayans, MD as Referring Physician (Gastroenterology) Lanny Callander, MD as Consulting Physician (Hematology) Ethyl Lonni BRAVO, MD (Inactive) as Consulting Physician (Otolaryngology) Zoraida Slater BRAVO, PhD as Consulting Physician (Psychology) Maurice Loving, MD as Referring Physician (Psychiatry) Babs Arthea DASEN, MD as Consulting Physician (Physical Medicine and Rehabilitation) Ziolkowska, Aldona, MD as Consulting Physician (Rheumatology) Melodi Lerner, MD as Consulting Physician (Orthopedic Surgery) Melita Drivers, MD as Consulting Physician (Orthopedic Surgery) Penne Knee, MD (Inactive) as Consulting Physician (Urology) Loreli Elyn SAILOR, MD (Family Medicine)   Outpatient Medications Prior to Visit  Medication Sig   baclofen  (LIORESAL ) 20 MG tablet Take 1-2 tablets (20-40 mg total)  by mouth 4 (four) times daily. 1 tablet with breakfast, lunch, and dinner and 2 tablets at bedtime   desonide  (DESOWEN ) 0.05 % cream Apply to affected area(s) twice daily   desvenlafaxine  (PRISTIQ ) 50 MG 24 hr tablet Take 1 tablet (50 mg total) by mouth daily.   famotidine  (PEPCID ) 40 MG tablet Take 1 tablet (40 mg total) by mouth at bedtime.   finasteride  (PROPECIA ) 1 MG tablet Take 1 tablet (1 mg total) by mouth daily.   folic acid  (FOLVITE ) 1 MG tablet Take 1 tablet (1,000 mcg total) by mouth daily.   furosemide  (LASIX ) 20 MG tablet Take 1 tablet (20 mg total) by mouth daily as needed for edema.   furosemide  (LASIX ) 40 MG tablet Take 1.5 tablets (60 mg total) by mouth in the morning AND 1 tablet (40 mg total) every evening.   glucose blood test strip Check blood sugar daily and as needed.   hydrOXYzine  (ATARAX ) 25 MG tablet Take 1 tablet (25 mg total) by mouth 3 (three) times daily AND 2 tablets (50 mg total) at bedtime as needed for sleep/anxiety   Lancets (FREESTYLE) lancets Use to check blood sugar daily and as needed   metFORMIN  (GLUCOPHAGE ) 500 MG tablet Take 2 tablets (1,000 mg total) by mouth 2 (two) times daily with a meal.   Methotrexate  2 MG/ML SOLN Take 25 mLs by mouth once a week.   methotrexate  50 MG/2ML injection Inject 1 mL (25 mg total) into the skin every 7 (seven) days.   metoprolol  tartrate (LOPRESSOR ) 25 MG tablet Take 1 tablet (25 mg total) by mouth 2 (two) times daily as needed.   morphine  (  MS CONTIN ) 30 MG 12 hr tablet Take 1 tablet (30 mg total) by mouth every 12 (twelve) hours.   ondansetron  (ZOFRAN ) 4 MG tablet Take 1 tablet (4 mg total) by mouth every 8 (eight) hours as needed for nausea or vomiting.   ondansetron  (ZOFRAN ) 8 MG tablet Take 1 tablet (8 mg total) by mouth every 8 (eight) hours as needed for nausea or vomiting.   ondansetron  (ZOFRAN -ODT) 4 MG disintegrating tablet Take 4 mg by mouth every 8 (eight) hours as needed for vomiting or nausea.   Oxycodone  HCl  10 MG TABS Take 1 tablet (10 mg total) by mouth every 8 (eight) hours as needed (pain).   Pancrelipase , Lip-Prot-Amyl, (CREON ) 24000-76000 units CPEP Take 1-2 capsules by mouth 3 (three) times daily with meals. And snacks   potassium chloride  (KLOR-CON ) 10 MEQ tablet Take 2 tablets by mouth every day.   RABEprazole  (ACIPHEX ) 20 MG tablet Take 1 tablet (20 mg total) by mouth in the morning AND 1 tablet (20 mg total) every evening. Take before meals.   rivaroxaban  (XARELTO ) 20 MG TABS tablet Take 1 tablet (20 mg total) by mouth nightly.   rOPINIRole  (REQUIP ) 0.25 MG tablet Take 1 tablet (0.25 mg total) by mouth at bedtime.   Tenapanor  HCl (IBSRELA ) 50 MG TABS Take 1 tablet (50 mg total) by mouth before breakfast and before evening meal.   testosterone  cypionate (DEPOTESTOSTERONE CYPIONATE) 200 MG/ML injection Inject 0.5 mLs (100 mg total) into the muscle every 7 (seven) days.   TUBERCULIN SYR 1CC/27GX1/2 (B-D TB SYRINGE 1CC/27GX1/2) 27G X 1/2 1 ML MISC Use 1 syringe to inject under the skin once weekly   Upadacitinib ER (RINVOQ) 15 MG TB24 Take 1 tablet (15 mg total) by mouth daily.   Vilazodone  HCl (VIIBRYD ) 40 MG TABS Take 1 tablet (40 mg total) by mouth daily.   Vitamin D , Ergocalciferol , (DRISDOL ) 1.25 MG (50000 UNIT) CAPS capsule Take 1 capsule (50,000 Units total) by mouth once a week.   No facility-administered medications prior to visit.    ROS     Objective:     BP 102/78   Pulse 100   Temp 98.6 F (37 C) (Oral)   Ht 5' 8 (1.727 m)   Wt 210 lb 11.2 oz (95.6 kg)   SpO2 96%   BMI 32.04 kg/m  {Vitals History (Optional):23777}  Physical Exam   No results found for any visits on 01/21/24. {Show previous labs (optional):23779}    Assessment & Plan:    Routine Health Maintenance and Physical Exam  Immunization History  Administered Date(s) Administered   Hepb-cpg 03/06/2021, 04/03/2021   Influenza Split 11/29/2010, 12/15/2011, 12/10/2012, 11/16/2013   Influenza,  Seasonal, Injecte, Preservative Fre 11/15/2014, 01/03/2016   Influenza,inj,Quad PF,6+ Mos 11/15/2014, 01/03/2016, 11/27/2016, 12/22/2017, 10/29/2018, 11/24/2018   Influenza,inj,quad, With Preservative 12/01/2016   Influenza-Unspecified 12/01/2013, 01/03/2016, 11/28/2016, 12/22/2017, 12/06/2019, 11/24/2020, 11/24/2020, 11/01/2021, 12/09/2021, 11/21/2022, 12/21/2023   Moderna Covid-19 Fall Seasonal Vaccine 40yrs & older 12/09/2021, 11/21/2022   Moderna Sars-Covid-2 Vaccination 08/01/2020, 11/24/2020, 11/01/2021   PFIZER(Purple Top)SARS-COV-2 Vaccination 05/25/2019, 06/22/2019, 11/04/2019   PNEUMOCOCCAL CONJUGATE-20 02/28/2021, 03/04/2022   Pfizer Covid-19 Vaccine Bivalent Booster 72yrs & up 06/22/2019, 02/28/2021   Pneumococcal Conjugate-13 12/01/2013   Td 03/03/2006, 03/02/2014   Td (Adult),5 Lf Tetanus Toxid, Preservative Free 03/02/2014   Tdap 03/02/2014    Health Maintenance  Topic Date Due   COVID-19 Vaccine (10 - 2025-26 season) 11/02/2023   HIV Screening  04/13/2024 (Originally 09/24/1980)   DTaP/Tdap/Td (5 - Td or  Tdap) 03/02/2024   Colonoscopy  11/05/2033   Pneumococcal Vaccine: 50+ Years  Completed   Influenza Vaccine  Completed   Hepatitis B Vaccines 19-59 Average Risk  Completed   Hepatitis C Screening  Completed   Zoster Vaccines- Shingrix  Completed   HPV VACCINES  Aged Out   Meningococcal B Vaccine  Aged Out   Mammogram  Discontinued    Discussed health benefits of physical activity, and encouraged him to engage in regular exercise appropriate for his age and condition.  There are no diagnoses linked to this encounter.  No follow-ups on file.     Heron CHRISTELLA Sharper, MD

## 2024-01-21 NOTE — Progress Notes (Addendum)
 Established Patient Office Visit  Subjective   Patient ID: Jesse Bowers, adult    DOB: 1965/12/11  Age: 58 y.o. MRN: [id number]  Chief Complaint  Patient presents with   Medical Management of Chronic Issues    HPI Discussed the use of AI scribe software for clinical note transcription with the patient, who gave verbal consent to proceed.  History of Present Illness   Jesse Bowers is a 58 year old male with cardiovascular disease and sleep apnea who presents for a follow-up visit.  He experiences persistent nausea and vomiting despite being on metformin  for almost a year. Symptoms persist even after dose reduction and temporary discontinuation. A GI evaluation is ongoing for possible gastritis or ulcer. He recently switched from Tremfya  to Rinvoq , which may contribute to nausea.  He is scheduled for a sleep study tonight to evaluate his OSA device, which has been missing apneic spells. He is exploring medication options for OSA, but insurance coverage is a barrier.  He has cardiovascular disease and stage 1B left-sided heart failure. He has not had stents placed or recent imaging for coronary artery disease. His last echocardiogram two years ago was not concerning. Cholesterol levels in February showed an LDL of 89.       Current Outpatient Medications  Medication Instructions   baclofen  (LIORESAL ) 20-40 mg, Oral, 4 times daily, 1 tablet with breakfast, lunch, and dinner and 2 tablets at bedtime   desonide  (DESOWEN ) 0.05 % cream Apply to affected area(s) twice daily   desvenlafaxine  (PRISTIQ ) 50 mg, Oral, Daily   famotidine  (PEPCID ) 40 mg, Oral, Nightly   finasteride  (PROPECIA ) 1 mg, Oral, Daily   folic acid  (FOLVITE ) 1 MG tablet Take 1 tablet (1,000 mcg total) by mouth daily.   furosemide  (LASIX ) 40 MG tablet Take 1.5 tablets (60 mg total) by mouth in the morning AND 1 tablet (40 mg total) every evening.   furosemide  (LASIX ) 20 mg, Oral, Daily PRN   glucose blood  test strip Check blood sugar daily and as needed.   hydrOXYzine  (ATARAX ) 25 MG tablet Take 1 tablet (25 mg total) by mouth 3 (three) times daily AND 2 tablets (50 mg total) at bedtime as needed for sleep/anxiety   Lancets (FREESTYLE) lancets Use to check blood sugar daily and as needed   metFORMIN  (GLUCOPHAGE ) 1,000 mg, Oral, 2 times daily with meals   Methotrexate  2 MG/ML SOLN 25 mLs, Weekly   methotrexate  25 mg, Subcutaneous, Every 7 days   metoprolol  tartrate (LOPRESSOR ) 25 mg, Oral, 2 times daily PRN   morphine  (MS CONTIN ) 30 mg, Oral, Every 12 hours   ondansetron  (ZOFRAN ) 4 MG tablet Take 1 tablet (4 mg total) by mouth every 8 (eight) hours as needed for nausea or vomiting.   ondansetron  (ZOFRAN ) 8 MG tablet Take 1 tablet (8 mg total) by mouth every 8 (eight) hours as needed for nausea or vomiting.   ondansetron  (ZOFRAN -ODT) 4 mg, Every 8 hours PRN   Oxycodone  HCl 10 mg, Oral, Every 8 hours PRN   Pancrelipase , Lip-Prot-Amyl, (CREON ) 24000-76000 units CPEP 1-2 capsules, 3 times daily with meals   potassium chloride  (KLOR-CON ) 10 MEQ tablet Take 2 tablets by mouth every day.   RABEprazole  (ACIPHEX ) 20 MG tablet Take 1 tablet (20 mg total) by mouth in the morning AND 1 tablet (20 mg total) every evening. Take before meals.   Rinvoq  15 mg, Oral, Daily   rivaroxaban  (XARELTO ) 20 MG TABS tablet Take 1 tablet (20 mg total) by  mouth nightly.   rOPINIRole  (REQUIP ) 0.25 mg, Oral, Daily at bedtime   Tenapanor  HCl (IBSRELA ) 50 MG TABS Take 1 tablet (50 mg total) by mouth before breakfast and before evening meal.   testosterone  cypionate (DEPOTESTOSTERONE CYPIONATE) 100 mg, Intramuscular, Every 7 days   TUBERCULIN SYR 1CC/27GX1/2 (B-D TB SYRINGE 1CC/27GX1/2) 27G X 1/2 1 ML MISC Use 1 syringe to inject under the skin once weekly   Vilazodone  HCl (VIIBRYD ) 40 mg, Oral, Daily   Vitamin D  (Ergocalciferol ) (DRISDOL ) 50,000 Units, Oral, Weekly    Patient Active Problem List   Diagnosis Date Noted    Major depressive disorder, single episode, moderate (HCC) 01/21/2024   Pain in joint, ankle and foot 01/13/2024   Thoracic spine pain 12/10/2022   Bilateral lumbar radiculopathy 10/31/2022   Lumbar disc disease 08/06/2022   Iliotibial band syndrome 08/06/2022   Non-recurrent acute suppurative otitis media of both ears without spontaneous rupture of tympanic membranes 07/09/2022   Weight gain due to medication 07/09/2022   Pancreatic insufficiency 04/09/2022   Posterior vitreous detachment of right eye 11/18/2021   Spondylosis of lumbar spine 10/03/2020   Pseudophakia of both eyes 05/07/2020   Retinal telangiectasis of both eyes 05/07/2020   Lattice degeneration of peripheral retina, left 09/08/2019   Lattice degeneration, right eye 09/08/2019   Chronic diastolic heart failure (HCC) 05/24/2019   Urinary dysfunction 04/12/2019   Osteoarthritis of right knee 08/23/2018   Constipation due to opioid therapy 03/30/2018   SNHL (sensorineural hearing loss) 12/04/2017   Abnormal urinary stream 12/03/2017   Osteoarthritis of carpometacarpal (CMC) joint of thumb 11/30/2017   Muscle weakness 11/17/2017   Transient vision disturbance 11/12/2017   Bilateral hand pain 10/30/2017   Pain of left hip joint 10/09/2017   Gynecomastia 07/10/2017   Iron deficiency anemia 07/05/2017   Spasticity 05/20/2017   Lumbar radiculitis 04/20/2017   Ulnar 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, left 11/28/2016   Idiopathic peripheral neuropathy 11/28/2016   Failed total knee arthroplasty, 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   Long term (current) use of anticoagulants 08/23/2015   Right upper quadrant abdominal pain 08/23/2015   Memory loss 05/10/2015   Gait abnormality 04/07/2015   Alkaline phosphatase elevation 04/07/2015   History of thrombosis 03/26/2015   Medial epicondylitis 02/07/2015   Cognitive decline 12/21/2014   Leukopenia 12/05/2014   Rotator cuff syndrome of right shoulder 10/27/2014   Status post left knee replacement  08/22/2014   Left lateral epicondylitis 08/22/2014   Chronic cerebral ischemia 08/18/2014   Arthrofibrosis of knee joint 08/17/2014   Cubital canal compression syndrome, left 08/17/2014   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   Chronic pain syndrome 04/10/2014   Insomnia 04/10/2014   Chronic non-specific white matter lesions on MRI 04/10/2014   CFS (chronic fatigue syndrome) 04/10/2014   Biceps tendonitis on left 03/01/2014   H/O TIA (transient ischemic attack) and stroke 12/27/2013   Neck pain 12/27/2013   OSA (obstructive sleep apnea) 12/08/2013   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/31/2013   Protein C deficiency (HCC) 08/01/2013   Post traumatic stress disorder (PTSD) 08/01/2013   Speech abnormality 07/25/2013   Obesity 05/10/2013   Lower extremity edema 05/10/2013   GERD (gastroesophageal reflux disease) 05/10/2013   Arthritis 05/10/2013   OA (osteoarthritis) of knee 03/15/2013   Headache 10/25/2012   Palpitations 10/18/2012   Fatty liver determined by biopsy 06/01/2012   Arthropathic psoriasis, unspecified (HCC) 04/29/2012   Abnormal liver enzymes 03/29/2012   Psoriatic arthritis (HCC) 12/29/2011   Left Renal Hydronephrosis 12/11/2010   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     Objective:     BP 102/78   Pulse 100   Temp 98.6 F (37 C) (Oral)   Ht 5' 8 (1.727 m)   Wt 210 lb 11.2 oz (95.6 kg)   SpO2 96%   BMI 32.04 kg/m    Physical Exam Vitals reviewed.  Constitutional:      Appearance: Normal appearance. He is well-groomed. He is obese.  Cardiovascular:     Rate and Rhythm: Normal rate and regular rhythm.     Heart sounds: S1 normal and S2 normal. No murmur heard. Pulmonary:     Effort: Pulmonary effort is normal.     Breath sounds: Normal breath sounds and air entry. No  rales.  Neurological:     General: No focal deficit present.     Mental Status: He is alert and oriented to person, place, and time.     Gait: Gait is intact.  Psychiatric:        Mood and Affect: Mood and affect normal.      No results found for any visits on 01/21/24.    The ASCVD Risk score (Arnett DK, et al., 2019) failed to calculate for the following reasons:   Risk score cannot be calculated because patient has a medical history suggesting prior/existing ASCVD    Assessment & Plan:  History of ASCVD -     CT CARDIAC SCORING (SELF PAY ONLY); Future  Weight gain due to medication -     metFORMIN  HCl; Take 2 tablets (1,000 mg total) by mouth 2 (two) times daily with a meal.  Dispense: 360 tablet; Refill: 1  Major depressive disorder, single episode, moderate (HCC) Chronic/stable, managed by Dr. Maurice at [facility]. Currently well controlled on his medication according to the last progress note from Dr. Maurice.    Assessment and Plan    Central sleep apnea Device not triggering during apneic spells. Differential includes device malfunction or anatomical issues such as throat or tongue position. Insurance does not cover GLP-1 agonists for OSA despite FDA approval. - Conducted sleep study to evaluate device efficacy and potential anatomical causes. - Discussed alternative options for GLP-1 agonists, including online compounding pharmacies and cash pay options.  Left-sided heart failure, stage 1B Stage 1B left-sided heart failure likely secondary to obstructive sleep apnea. No recent imaging studies to assess coronary artery disease. Potential for insurance coverage of Wegovy if coronary artery disease is documented. - Ordered CT calcium score to assess for coronary artery disease. - Will consider Tzhncb for cardiovascular benefits if coronary artery disease is documented.  Abnormal weight gain Discussed potential use of GLP-1 agonists for weight management. Insurance coverage  challenges noted. Discussed cost-effective options for obtaining GLP-1 agonists, including online compounding pharmacies and cash pay options. - Discussed cost-effective options for obtaining GLP-1 agonists, including online compounding pharmacies and cash pay options. -- Metformin  does not seem to be the medication that is causing the N/V -- pt was having symptoms of N/V even when he was off metformin . Will continue as prescribed. Weight today is relatively stable over time.         Return in about 6 months (around 07/20/2024).    Heron CHRISTELLA Sharper, MD

## 2024-01-22 ENCOUNTER — Other Ambulatory Visit (HOSPITAL_BASED_OUTPATIENT_CLINIC_OR_DEPARTMENT_OTHER): Payer: Self-pay

## 2024-01-22 ENCOUNTER — Other Ambulatory Visit: Payer: Self-pay

## 2024-01-22 DIAGNOSIS — E611 Iron deficiency: Secondary | ICD-10-CM | POA: Diagnosis not present

## 2024-01-22 DIAGNOSIS — G4731 Primary central sleep apnea: Secondary | ICD-10-CM | POA: Diagnosis not present

## 2024-01-22 MED ORDER — VOQUEZNA 10 MG PO TABS
1.0000 | ORAL_TABLET | Freq: Every day | ORAL | 2 refills | Status: AC
  Filled 2024-01-22: qty 30, 30d supply, fill #0
  Filled 2024-02-26 – 2024-03-25 (×2): qty 30, 30d supply, fill #1

## 2024-01-25 ENCOUNTER — Encounter (HOSPITAL_BASED_OUTPATIENT_CLINIC_OR_DEPARTMENT_OTHER): Payer: Self-pay

## 2024-01-25 ENCOUNTER — Other Ambulatory Visit (HOSPITAL_BASED_OUTPATIENT_CLINIC_OR_DEPARTMENT_OTHER): Payer: Self-pay

## 2024-01-25 ENCOUNTER — Other Ambulatory Visit: Payer: Self-pay

## 2024-01-25 ENCOUNTER — Encounter: Payer: Self-pay | Admitting: Family Medicine

## 2024-01-25 ENCOUNTER — Encounter (HOSPITAL_BASED_OUTPATIENT_CLINIC_OR_DEPARTMENT_OTHER): Admitting: Physical Therapy

## 2024-01-26 ENCOUNTER — Other Ambulatory Visit: Payer: Self-pay

## 2024-01-26 ENCOUNTER — Other Ambulatory Visit (HOSPITAL_BASED_OUTPATIENT_CLINIC_OR_DEPARTMENT_OTHER): Payer: Self-pay

## 2024-01-27 ENCOUNTER — Other Ambulatory Visit: Payer: Self-pay

## 2024-01-27 DIAGNOSIS — F331 Major depressive disorder, recurrent, moderate: Secondary | ICD-10-CM | POA: Diagnosis not present

## 2024-01-31 NOTE — Therapy (Incomplete)
 OUTPATIENT PHYSICAL THERAPY CERVICAL EVALUATION   Patient Name: Jesse Bowers MRN: [id number] DOB:[birth year], 58 y.o., adult Today's Date: 01/31/2024  END OF SESSION:   Past Medical History:  Diagnosis Date   Abnormal weight loss    Anxiety    Arthritis    Cataract    OU   Celiac disease    Cervical neck pain with evidence of disc disease    patient has a cyst    Chronic constipation    Chronic diastolic heart failure (HCC)    Pt. 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   Family history of adverse reaction to anesthesia    family has problems with anesthesia of nausea and vomiting    GERD (gastroesophageal reflux disease)    History of in 20's   Gluten enteropathy    H/O parotitis    right    Hard of hearing    History of kidney stones    History of retinal tear    Bilateral   History of staph infection    required wound vac   Hx-TIA (transient ischemic attack)    2015   Kidney stones 08/2020   LVH (left ventricular hypertrophy) 12/15/2016   Mild, noted on ECHO   MVP (mitral valve prolapse)    NAFL (nonalcoholic fatty liver)    Neck pain    Neuromuscular disorder (HCC)    bilateral neuropathy feet.   Nuclear sclerotic cataract of both eyes 09/08/2019   Pneumonia 12/17/2010   Polycythemia    Polycythemia, secondary    PONV (postoperative nausea and vomiting)    Protein C deficiency    Dr. Lanny Abide   Psoriasis    16 X10 cm psoriatic rash on sole of left foot ; open and occ scant bleeding;    psoriatic arthritis    PTSD (post-traumatic stress disorder)    Scaphoid fracture of wrist 09/23/2013   Seizure (HCC)    childhood, medication until age 28 then weanned completely off   Sleep apnea    split night study last done by Dr. Vear  06/18/15 shows severe OSA, CSA, and hypersomnia, rec bipap   Splenomegaly    Stenosis of ureteropelvic junction (UPJ)    left   Stroke [facility])    CVA vs TIA in left cerebrum causing slight right sided weakness-Dr. 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    Wears glasses    Wears hearing aid    Past Surgical History:  Procedure Laterality Date   ABDOMINAL HYSTERECTOMY Bilateral 1994   TAH, BSO- tranverse incision at 58 yo   ANKLE ARTHROSCOPY WITH RECONSTRUCTION Right 2007   CHOLECYSTECTOMY     laparoscopic   COLONOSCOPY     x3   EYE SURGERY     Left eye 03/02/2018, right 02/15/2018   HEMORROIDECTOMY  08/19/2020   HIP ARTHROSCOPY W/ LABRAL REPAIR Right 05/11/2013   acetabular labral tear 03/30/2013   KNEE ARTHROPLASTY Right    KNEE  JOINT MANIPULATION Left    x3 under anesthesia   KNEE SURGERY Bilateral 1984   Right ACL, left PCL repair   LITHOTRIPSY  2005   LIVER BIOPSY  2013   normal results.   MASTECTOMY Bilateral    prior to 2009   MOUTH SURGERY     NASAL SEPTUM SURGERY N/A 09/20/2015   by ENT Dr. Ethyl   OVARIAN CYST SURGERY Left    size of grapefruit, was informed that she had shortened vagina   SHOULDER SURGERY Bilateral    Right 08/15/2016, Left 11/15/2016   THUMB ARTHROSCOPY Left    THYROIDECTOMY, PARTIAL Left 2008   TOTAL KNEE ARTHROPLASTY Right 08/23/2018   Procedure: TOTAL KNEE ARTHROPLASTY;  Surgeon: Melodi Lerner, MD;  Location: [facility];  Service: Orthopedics;  Laterality: Right;    TOTAL KNEE REVISION Left 02/06/2016   Procedure: LEFT TOTAL KNEE REVISION;  Surgeon: Lerner Melodi, MD;  Location: [facility];  Service: Orthopedics;  Laterality: Left;   TOTAL KNEE REVISION Left 04/22/2017   Procedure: Left knee polyethylene revision;  Surgeon: Melodi Lerner, MD;   Location: [facility];  Service: Orthopedics;  Laterality: Left;   UPPER GI ENDOSCOPY  2003   Patient Active Problem List   Diagnosis Date Noted   Major depressive disorder, single episode, moderate (HCC) 01/21/2024   Pain in joint, ankle and foot 01/13/2024   Thoracic spine pain 12/10/2022   Bilateral lumbar radiculopathy 10/31/2022   Lumbar disc disease 08/06/2022   Iliotibial band syndrome 08/06/2022   Non-recurrent acute suppurative otitis media of both ears without spontaneous rupture of tympanic membranes 07/09/2022   Weight gain due to medication 07/09/2022   Pancreatic insufficiency 04/09/2022   Posterior vitreous detachment of right eye 11/18/2021   Spondylosis of lumbar spine 10/03/2020   Pseudophakia of both eyes 05/07/2020   Retinal telangiectasis of both eyes 05/07/2020   Lattice degeneration of peripheral retina, left 09/08/2019   Lattice degeneration, right eye 09/08/2019   Chronic diastolic heart failure (HCC) 05/24/2019   Urinary dysfunction 04/12/2019   Osteoarthritis of right knee 08/23/2018   Constipation due to opioid therapy 03/30/2018   SNHL (sensorineural hearing loss) 12/04/2017   Abnormal urinary stream 12/03/2017   Osteoarthritis of carpometacarpal (CMC) joint of thumb 11/30/2017   Muscle weakness 11/17/2017   Transient vision disturbance 11/12/2017   Bilateral hand pain 10/30/2017   Pain of left hip joint 10/09/2017   Gynecomastia 07/10/2017   Iron deficiency anemia 07/05/2017   Spasticity 05/20/2017   Lumbar radiculitis 04/20/2017   Ulnar 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, left 11/28/2016   Idiopathic peripheral neuropathy 11/28/2016   Failed total knee arthroplasty, 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   Long term (current) use of anticoagulants 08/23/2015   Right upper quadrant abdominal pain 08/23/2015   Memory loss 05/10/2015   Gait abnormality 04/07/2015   Alkaline phosphatase elevation 04/07/2015   History of thrombosis 03/26/2015   Medial epicondylitis 02/07/2015    Cognitive decline 12/21/2014   Leukopenia 12/05/2014   Rotator cuff syndrome of right shoulder 10/27/2014   Status post left knee replacement 08/22/2014   Left lateral epicondylitis 08/22/2014   Chronic cerebral ischemia 08/18/2014   Arthrofibrosis of knee joint 08/17/2014   Cubital canal compression syndrome, left 08/17/2014   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   Chronic pain syndrome 04/10/2014   Insomnia 04/10/2014   Chronic non-specific white matter lesions on MRI 04/10/2014   CFS (chronic fatigue syndrome) 04/10/2014   Biceps tendonitis on left 03/01/2014   H/O TIA (transient ischemic attack) and stroke 12/27/2013  Neck pain 12/27/2013   OSA (obstructive sleep apnea) 12/08/2013   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/31/2013   Protein C deficiency (HCC) 08/01/2013   Post traumatic stress disorder (PTSD) 08/01/2013   Speech abnormality 07/25/2013   Obesity 05/10/2013   Lower extremity edema 05/10/2013   GERD (gastroesophageal reflux disease) 05/10/2013   Arthritis 05/10/2013   OA (osteoarthritis) of knee 03/15/2013   Headache 10/25/2012   Palpitations 10/18/2012   Fatty liver determined by biopsy 06/01/2012   Arthropathic psoriasis, unspecified (HCC) 04/29/2012   Abnormal liver enzymes 03/29/2012   Psoriatic arthritis (HCC) 12/29/2011   Left Renal Hydronephrosis 12/11/2010   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  Rationale for Evaluation and Treatment: Rehabilitation  ONSET DATE: ***  SUBJECTIVE:                                                                                                                                                                                                         SUBJECTIVE  STATEMENT: Pt has a hx of chronic back pain.  Pt received PT in 03/24 to 04/24.  Pt was discharged due to surgery.  Pt takes Morphine  twice per day, but tries not to take the oxycodone .  Pt has received PT over the years for different issues including aquatic therapy in the past for neuro rehab.  Pt received PT from 08/23 - 12/23 for cervicalgia and cervical spondylosis.   HEP?????   Pt was using a power W/C until August 2023 and the chair stopped working.  Pt was using the power W/C due to the TIA/CVA and LE weakness.  Pt used a walker though it bothered his wrists.  He has been walking some now without an AD.    Pt states his legs give out when he walks.  He had 1 fall in February.   Pt saw NP on 10/26/23:   His current exercise regime is walking and performing stretching exercises.  Cervical MIR has been ordered.  Surgery 12/2???  He admits a couple months ago he was walking his dog, lost his balance and fell injuring his right arm/shoulder.  Respicardia device was not functioning appropriately. His lead was not able to capture or stimulate his diaphragm. I do not have the full report but was able to communicate with a representative from [employer]. I  will have the patient undergo a chest x-ray to assess the position of his Respicardia lead.    Hand dominance: {MISC; OT HAND DOMINANCE:[id number]}  PERTINENT HISTORY:  12/22/23 scrotoplasty, plastic operation to penis, testicular prosthesis--------clearance for PT , any restrictions??????? 5 weeks and 6 days  -- may need clearance for PT order prior to surgery.  Gender dysphoria -Chronic pain including lumbar, lumbar spondylosis  Falls??   -CVA vs TIA in 2015 --with residual right sided weakness, sensory loss, and expressive language deficits -- (Pt reports he has spasticity), neuropathy bilat feet, heart failure and Pt uses lasix , Hx of DVT in R UE and bilat LE   -psoriasis, psoriatic arthritis which causes pain in multiple joints, OA,  C4-6 syrinx and cervical spondylosis, and osteopenia     -Pancreatic insufficiency, celiac disease, and IBS. -Anxiety and PTSD   -PSHx:  R Hip labral repair in 2015, R TKA in 2020, L TKR with 2 revisions with the last in 2018, R Ankle arthroscopy with reconstruction in 2007 6x???, Bilat shoulder surgery, R thumb replacement, and gender reconstruction surgery   PAIN:  Are you having pain? {OPRCPAIN:27236}  PRECAUTIONS: {Therapy precautions:24002}  RED FLAGS: {PT Red Flags:29287}     WEIGHT BEARING RESTRICTIONS: {Yes ***/No:24003}  FALLS:  Has patient fallen in last 6 months? {fallsyesno:27318}  LIVING ENVIRONMENT: Lives with: {OPRC lives with:25569::lives with their family} Lives in: {Lives in:25570} Stairs: {opstairs:27293} Has following equipment at home: {Assistive devices:23999}  OCCUPATION: ***  PLOF: {PLOF:24004}  PATIENT GOALS: ***  NEXT MD VISIT: ***  OBJECTIVE:  Note: Objective measures were completed at Evaluation unless otherwise noted.  DIAGNOSTIC FINDINGS:  Cervical X rays 08/25: FINDINGS: Straightening of the normal cervical lordosis. Visualization through C7 on lateral view. Multilevel degenerative disc disease most pronounced C5-6 and C6-7. Prevertebral soft tissues unremarkable. Lung apices are clear. Lateral masses articulate appropriately with the dens.   IMPRESSION: Multilevel degenerative disc disease most pronounced C5-6 and C6-7.  Cervical MRI in 2023: FINDINGS: Alignment: Physiologic.   Vertebrae: Diffuse decrease of the T1 signal within the visualized spine may represent red marrow reconversion. However marrow replacement pathologies cannot be entirely excluded. This is new compared to prior MRI. No fracture or evidence of discitis.   Cord: Stable appearance elevated focus of increased T2 signal within the spinal cord extending from the C4 to the C6 level measuring up to 35 mm in length and 4 mm in maximal diameter at the C5  level, consistent with syrinx.   Posterior Fossa, vertebral arteries, paraspinal tissues: Negative.   Disc levels:   C2-3: No spinal canal or neural foraminal stenosis.   C3-4:Facet degenerative change resulting mild-to-moderate left neural foraminal narrowing, unchanged. No spinal canal stenosis.   C4-5: Minimal disc bulge. Uncovertebral and facet degenerative changes resulting in mild right neural foraminal narrowing. No spinal canal stenosis.   C5-6:Minimal disc bulge. Uncovertebral and facet degenerative changes resulting in mild right neural foraminal narrowing. No spinal canal stenosis.   C6-7: Minimal disc bulge. No spinal canal or neural foraminal stenosis.   C7-T1: No spinal canal or neural foraminal stenosis.   IMPRESSION: 1. No interval change of the syrinx spanning the C4 through C6 levels measuring up to 4 mm in diameter. 2. Mild degenerative changes of the cervical spine without high-grade spinal canal stenosis at any level. 3. Mild to moderate left neural foraminal narrowing at C3-4, unchanged.     PATIENT SURVEYS:  {rehab surveys:24030}  COGNITION: Overall cognitive status: {cognition:24006}  SENSATION: {sensation:27233}  POSTURE: {  posture:25561}  PALPATION: ***   CERVICAL ROM:   {AROM/PROM:27142} ROM A/PROM (deg) eval  Flexion   Extension   Right lateral flexion   Left lateral flexion   Right rotation   Left rotation    (Blank rows = not tested)  UPPER EXTREMITY ROM:  {AROM/PROM:27142} ROM Right eval Left eval  Shoulder flexion    Shoulder extension    Shoulder abduction    Shoulder adduction    Shoulder extension    Shoulder internal rotation    Shoulder external rotation    Elbow flexion    Elbow extension    Wrist flexion    Wrist extension    Wrist ulnar deviation    Wrist radial deviation    Wrist pronation    Wrist supination     (Blank rows = not tested)  UPPER EXTREMITY MMT:  MMT Right eval Left eval   Shoulder flexion    Shoulder extension    Shoulder abduction    Shoulder adduction    Shoulder extension    Shoulder internal rotation    Shoulder external rotation    Middle trapezius    Lower trapezius    Elbow flexion    Elbow extension    Wrist flexion    Wrist extension    Wrist ulnar deviation    Wrist radial deviation    Wrist pronation    Wrist supination    Grip strength     (Blank rows = not tested)  CERVICAL SPECIAL TESTS:  {Cervical special tests:25246}  FUNCTIONAL TESTS:  {Functional tests:24029}  TREATMENT DATE: ***                                                                                                                               Cervical retraction??? Scap retraction  PATIENT EDUCATION:  Education details: *** Person educated: {Person educated:25204} Education method: {Education Method:25205} Education comprehension: {Education Comprehension:25206}  HOME EXERCISE PROGRAM: ***  ASSESSMENT:  CLINICAL IMPRESSION: Patient is a *** y.o. *** who was seen today for physical therapy evaluation and treatment for ***.   OBJECTIVE IMPAIRMENTS: {opptimpairments:25111}.   ACTIVITY LIMITATIONS: {activitylimitations:27494}  PARTICIPATION LIMITATIONS: {participationrestrictions:25113}  PERSONAL FACTORS: {Personal factors:25162} are also affecting patient's functional outcome.   REHAB POTENTIAL: {rehabpotential:25112}  CLINICAL DECISION MAKING: {clinical decision making:25114}  EVALUATION COMPLEXITY: {Evaluation complexity:25115}   GOALS: Goals reviewed with patient? {yes/no:20286}  SHORT TERM GOALS: Target date: ***  *** Baseline:  Goal status: INITIAL  2.  *** Baseline:  Goal status: INITIAL  3.  *** Baseline:  Goal status: INITIAL  4.  *** Baseline:  Goal status: INITIAL  5.  *** Baseline:  Goal status: INITIAL  6.  *** Baseline:  Goal status: INITIAL  LONG TERM GOALS: Target date: ***  *** Baseline:  Goal  status: INITIAL  2.  *** Baseline:  Goal status: INITIAL  3.  *** Baseline:  Goal status: INITIAL  4.  *** Baseline:  Goal status:  INITIAL  5.  *** Baseline:  Goal status: INITIAL  6.  *** Baseline:  Goal status: INITIAL   PLAN:  PT FREQUENCY: {rehab frequency:25116}  PT DURATION: {rehab duration:25117}  PLANNED INTERVENTIONS: {rehab planned interventions:25118::97110-Therapeutic exercises,97530- Therapeutic [phone]- Neuromuscular re-education,97535- Self Rjmz,02859- Manual therapy,Patient/Family education}  PLAN FOR NEXT SESSION: PIERRETTE Mose Minerva, PT 01/31/2024, 2:17 PM

## 2024-02-01 ENCOUNTER — Ambulatory Visit (HOSPITAL_BASED_OUTPATIENT_CLINIC_OR_DEPARTMENT_OTHER): Admitting: Physical Therapy

## 2024-02-02 ENCOUNTER — Other Ambulatory Visit: Payer: Self-pay

## 2024-02-02 DIAGNOSIS — T85191A Other mechanical complication of implanted electronic neurostimulator (electrode) of peripheral nerve, initial encounter: Secondary | ICD-10-CM | POA: Diagnosis not present

## 2024-02-02 DIAGNOSIS — I509 Heart failure, unspecified: Secondary | ICD-10-CM | POA: Diagnosis not present

## 2024-02-02 DIAGNOSIS — Z8673 Personal history of transient ischemic attack (TIA), and cerebral infarction without residual deficits: Secondary | ICD-10-CM | POA: Diagnosis not present

## 2024-02-02 DIAGNOSIS — G4731 Primary central sleep apnea: Secondary | ICD-10-CM | POA: Diagnosis not present

## 2024-02-02 DIAGNOSIS — Z9682 Presence of neurostimulator: Secondary | ICD-10-CM | POA: Diagnosis not present

## 2024-02-02 DIAGNOSIS — T85121A Displacement of implanted electronic neurostimulator (electrode) of peripheral nerve, initial encounter: Secondary | ICD-10-CM | POA: Diagnosis not present

## 2024-02-03 ENCOUNTER — Other Ambulatory Visit: Payer: Self-pay

## 2024-02-03 DIAGNOSIS — F331 Major depressive disorder, recurrent, moderate: Secondary | ICD-10-CM | POA: Diagnosis not present

## 2024-02-08 ENCOUNTER — Encounter (HOSPITAL_BASED_OUTPATIENT_CLINIC_OR_DEPARTMENT_OTHER): Payer: Self-pay | Admitting: Physical Therapy

## 2024-02-09 ENCOUNTER — Ambulatory Visit (HOSPITAL_BASED_OUTPATIENT_CLINIC_OR_DEPARTMENT_OTHER): Attending: Family Medicine

## 2024-02-10 ENCOUNTER — Other Ambulatory Visit: Payer: Self-pay

## 2024-02-10 ENCOUNTER — Other Ambulatory Visit (HOSPITAL_BASED_OUTPATIENT_CLINIC_OR_DEPARTMENT_OTHER): Payer: Self-pay

## 2024-02-10 ENCOUNTER — Other Ambulatory Visit (HOSPITAL_COMMUNITY): Payer: Self-pay

## 2024-02-11 ENCOUNTER — Other Ambulatory Visit (HOSPITAL_BASED_OUTPATIENT_CLINIC_OR_DEPARTMENT_OTHER): Payer: Self-pay

## 2024-02-11 MED ORDER — METHOTREXATE SODIUM CHEMO INJECTION 50 MG/2ML
25.0000 mg | INTRAMUSCULAR | 0 refills | Status: AC
  Filled 2024-02-12 – 2024-04-05 (×10): qty 10, 70d supply, fill #0

## 2024-02-12 ENCOUNTER — Other Ambulatory Visit: Payer: Self-pay

## 2024-02-12 ENCOUNTER — Other Ambulatory Visit (HOSPITAL_BASED_OUTPATIENT_CLINIC_OR_DEPARTMENT_OTHER): Payer: Self-pay

## 2024-02-12 ENCOUNTER — Other Ambulatory Visit (HOSPITAL_COMMUNITY): Payer: Self-pay

## 2024-02-12 ENCOUNTER — Encounter: Payer: Self-pay | Admitting: Cardiovascular Disease

## 2024-02-12 NOTE — Progress Notes (Signed)
 Specialty Pharmacy Refill Coordination Note  Jesse Bowers is a 58 y.o. adult contacted today regarding refills of specialty medication(s) Upadacitinib  (Rinvoq )   Patient requested Delivery   Delivery date: 02/18/24   Verified address: [address]  [place] [id number]   Medication will be filled on: 02/17/24

## 2024-02-15 ENCOUNTER — Other Ambulatory Visit (HOSPITAL_BASED_OUTPATIENT_CLINIC_OR_DEPARTMENT_OTHER): Payer: Self-pay

## 2024-02-15 ENCOUNTER — Encounter (HOSPITAL_BASED_OUTPATIENT_CLINIC_OR_DEPARTMENT_OTHER): Payer: Self-pay | Admitting: Physical Therapy

## 2024-02-15 DIAGNOSIS — D751 Secondary polycythemia: Secondary | ICD-10-CM | POA: Diagnosis not present

## 2024-02-15 DIAGNOSIS — E611 Iron deficiency: Secondary | ICD-10-CM | POA: Diagnosis not present

## 2024-02-15 MED ORDER — XARELTO 20 MG PO TABS
20.0000 mg | ORAL_TABLET | Freq: Every evening | ORAL | 3 refills | Status: AC
  Filled 2024-02-15 – 2024-02-26 (×3): qty 90, 90d supply, fill #0

## 2024-02-16 ENCOUNTER — Other Ambulatory Visit (HOSPITAL_COMMUNITY): Payer: Self-pay

## 2024-02-16 ENCOUNTER — Other Ambulatory Visit (HOSPITAL_BASED_OUTPATIENT_CLINIC_OR_DEPARTMENT_OTHER): Payer: Self-pay

## 2024-02-17 ENCOUNTER — Other Ambulatory Visit: Payer: Self-pay

## 2024-02-17 DIAGNOSIS — F331 Major depressive disorder, recurrent, moderate: Secondary | ICD-10-CM | POA: Diagnosis not present

## 2024-02-18 ENCOUNTER — Other Ambulatory Visit (HOSPITAL_BASED_OUTPATIENT_CLINIC_OR_DEPARTMENT_OTHER): Payer: Self-pay

## 2024-02-18 ENCOUNTER — Other Ambulatory Visit: Payer: Self-pay

## 2024-02-18 MED ORDER — NYSTATIN 100000 UNIT/ML MT SUSP
5.0000 mL | Freq: Four times a day (QID) | OROMUCOSAL | 2 refills | Status: AC
  Filled 2024-02-18: qty 480, 24d supply, fill #0
  Filled 2024-02-26: qty 480, 14d supply, fill #1
  Filled ????-??-??: fill #1

## 2024-02-19 ENCOUNTER — Encounter: Payer: Self-pay | Admitting: Registered Nurse

## 2024-02-19 ENCOUNTER — Encounter: Attending: Physical Medicine & Rehabilitation | Admitting: Registered Nurse

## 2024-02-19 ENCOUNTER — Other Ambulatory Visit (HOSPITAL_COMMUNITY): Payer: Self-pay

## 2024-02-19 VITALS — BP 127/90 | HR 89 | Ht 68.0 in | Wt 208.2 lb

## 2024-02-19 DIAGNOSIS — M5412 Radiculopathy, cervical region: Secondary | ICD-10-CM | POA: Diagnosis present

## 2024-02-19 DIAGNOSIS — Z5181 Encounter for therapeutic drug level monitoring: Secondary | ICD-10-CM | POA: Diagnosis present

## 2024-02-19 DIAGNOSIS — M2550 Pain in unspecified joint: Secondary | ICD-10-CM | POA: Diagnosis present

## 2024-02-19 DIAGNOSIS — G609 Hereditary and idiopathic neuropathy, unspecified: Secondary | ICD-10-CM | POA: Diagnosis present

## 2024-02-19 DIAGNOSIS — M25562 Pain in left knee: Secondary | ICD-10-CM | POA: Diagnosis present

## 2024-02-19 DIAGNOSIS — M5416 Radiculopathy, lumbar region: Secondary | ICD-10-CM | POA: Insufficient documentation

## 2024-02-19 DIAGNOSIS — M542 Cervicalgia: Secondary | ICD-10-CM | POA: Diagnosis present

## 2024-02-19 DIAGNOSIS — G8929 Other chronic pain: Secondary | ICD-10-CM | POA: Diagnosis present

## 2024-02-19 DIAGNOSIS — G894 Chronic pain syndrome: Secondary | ICD-10-CM | POA: Diagnosis present

## 2024-02-19 DIAGNOSIS — Z79891 Long term (current) use of opiate analgesic: Secondary | ICD-10-CM | POA: Insufficient documentation

## 2024-02-19 DIAGNOSIS — M47816 Spondylosis without myelopathy or radiculopathy, lumbar region: Secondary | ICD-10-CM | POA: Diagnosis present

## 2024-02-19 MED ORDER — MORPHINE SULFATE ER 30 MG PO TBCR
30.0000 mg | EXTENDED_RELEASE_TABLET | Freq: Two times a day (BID) | ORAL | 0 refills | Status: DC
  Filled 2024-02-19: qty 60, 30d supply, fill #0

## 2024-02-19 NOTE — Progress Notes (Signed)
 "  Subjective:    Patient ID: Jesse Bowers, adult    DOB: [birth year], 58 y.o.   MRN: [id number]  HPI: Jesse Bowers is a 58 y.o. male who returns for follow up appointment for chronic pain and medication refill. He states he has post op pain from EP Lead revision, with chest wall swelling, he is scheduled for surgery.  Also reports neck pain radiating into his right shoulder,lower back pain radiating into his left lower extremity, left knee pain ( Popliteal Fossa) and generalized joint pain. Also reports tingling and burning in his bilateral toes. He  rates his pain 8. His current exercise regime is walking and performing stretching exercises.  Jesse Bowers Morphine  equivalent is 105.00 MME.   Last Oral Swab was Performed on 01/13/2024, it was consistent.     Pain Inventory Average Pain 7 Pain Right Now 8 My pain is sharp, burning, stabbing, tingling, and aching  In the last 24 hours, has pain interfered with the following? General activity 7 Relation with others 6 Enjoyment of life 8 What TIME of day is your pain at its worst? morning  and evening Sleep (in general) Poor  Pain is worse with: walking, standing, and some activites Pain improves with: heat/ice, therapy/exercise, medication, and TENS Relief from Meds: 8  Family History  Problem Relation Age of Onset   Stroke Maternal Grandfather        45   Heart attack Maternal Grandfather    Glaucoma Maternal Grandfather    Macular degeneration Maternal Grandfather    Breast cancer Sister    Hypertension Mother    Psoriasis Mother    Other Mother        meningioma developed ~2019   Glaucoma Mother    Cancer Paternal Grandfather    Heart attack Paternal Grandfather    Stroke Paternal Uncle        age 88   Polycythemia Paternal Uncle    Stroke Maternal Grandmother    Congestive Heart Failure Maternal Grandmother    Heart attack Maternal Grandmother    Protein C deficiency Sister 60       Miscarriages   Breast  cancer Maternal Aunt 35   Social History   Socioeconomic History   Marital status: Married    Spouse name: Not on file   Number of children: 2   Years of education: 4y college   Highest education level: Master's degree (e.g., MA, MS, MEng, MEd, MSW, MBA)  Occupational History   Occupation: Restaurant Manager, [employer]    Comment: Not working since CVA 2015  Tobacco Use   Smoking status: Never   Smokeless tobacco: Never  Vaping Use   Vaping status: Never Used  Substance and Sexual Activity   Alcohol  use: Yes    Comment: social   Drug use: No   Sexual activity: Yes    Birth control/protection: None    Comment: patient is a transgender on testosterone  shots, no biological kids  Other Topics Concern   Not on file  Social History Narrative   Are you right handed or left handed? right   Are you currently employed ?    What is your current occupation? disabled   Do you live at home alone?   Who lives with you? Wife and 2 dogs   What type of home do you live in: 1 story or 2 story? two   Caffiene 1 soda a day    Social Drivers of Health   Tobacco Use:  Low Risk (02/19/2024)   Patient History    Smoking Tobacco Use: Never    Smokeless Tobacco Use: Never    Passive Exposure: Not on file  Financial Resource Strain: Low Risk (01/22/2024)   Received from [facility]   Overall Financial Resource Strain (CARDIA)    How hard is it for you to pay for the very basics like food, housing, medical care, and heating?: Not hard at all  Food Insecurity: No Food Insecurity (01/22/2024)   Received from [facility]   Epic    Within the past 12 months, you worried that your food would run out before you got the money to buy more.: Never true    Within the past 12 months, the food you bought just didn't last and you didn't have money to get more.: Never true  Transportation Needs: No Transportation Needs (01/22/2024)   Received from [facility]    In the past 12 months, has lack  of transportation kept you from medical appointments or from getting medications?: No    In the past 12 months, has lack of transportation kept you from meetings, work, or from getting things needed for daily living?: No  Recent Concern: Transportation Needs - Unmet Transportation Needs (01/18/2024)   Epic    Lack of Transportation (Medical): Yes    Lack of Transportation (Non-Medical): Yes  Physical Activity: Inactive (01/18/2024)   Exercise Vital Sign    Days of Exercise per Week: 0 days    Minutes of Exercise per Session: Not on file  Stress: Stress Concern Present (01/18/2024)   [employer] of Occupational Health - Occupational Stress Questionnaire    Feeling of Stress: To some extent  Social Connections: Socially Isolated (01/18/2024)   Social Connection and Isolation Panel    Frequency of Communication with Friends and Family: Never    Frequency of Social Gatherings with Friends and Family: Never    Attends Religious Services: Never    Database Administrator or Organizations: No    Attends Engineer, Structural: Not on file    Marital Status: Married  Depression (PHQ2-9): Low Risk (02/19/2024)   Depression (PHQ2-9)    PHQ-2 Score: 0  Recent Concern: Depression (PHQ2-9) - Medium Risk (01/13/2024)   Depression (PHQ2-9)    PHQ-2 Score: 5  Alcohol  Screen: Low Risk (01/18/2024)   Alcohol  Screen    Last Alcohol  Screening Score (AUDIT): 1  Housing: Low Risk (01/22/2024)   [facility]    In the last 12 months, was there a time when you were not able to pay the mortgage or rent on time?: No    In the past 12 months, how many times have you moved where you were living?: 0    At any time in the past 12 months, were you homeless or living in a shelter (including now)?: No  Utilities: Not At Risk (01/22/2024)   Received [facility]    In the past 12 months has the electric, gas, oil, or water  company threatened to shut off services in  your home?: No  Health Literacy: Not on file   Past Surgical History:  Procedure Laterality Date   ABDOMINAL HYSTERECTOMY Bilateral 1994   TAH, BSO- tranverse incision at 58 yo   ANKLE ARTHROSCOPY WITH RECONSTRUCTION Right 2007   CHOLECYSTECTOMY     laparoscopic   COLONOSCOPY     x3   EYE SURGERY  Left eye 03/02/2018, right 02/15/2018   HEMORROIDECTOMY  08/19/2020   HIP ARTHROSCOPY W/ LABRAL REPAIR Right 05/11/2013   acetabular labral tear 03/30/2013   KNEE ARTHROPLASTY Right    KNEE JOINT MANIPULATION Left    x3 under anesthesia   KNEE SURGERY Bilateral 1984   Right ACL, left PCL repair   LITHOTRIPSY  2005   LIVER BIOPSY  2013   normal results.   MASTECTOMY Bilateral    prior to 2009   MOUTH SURGERY     NASAL SEPTUM SURGERY N/A 09/20/2015   by ENT Dr. Ethyl   OVARIAN CYST SURGERY Left    size of grapefruit, was informed that she had shortened vagina   SHOULDER SURGERY Bilateral    Right 08/15/2016, Left 11/15/2016   THUMB ARTHROSCOPY Left    THYROIDECTOMY, PARTIAL Left 2008   TOTAL KNEE ARTHROPLASTY Right 08/23/2018   Procedure: TOTAL KNEE ARTHROPLASTY;  Surgeon: Melodi Lerner, MD;  Location: [facility];  Service: Orthopedics;  Laterality: Right;    TOTAL KNEE REVISION Left 02/06/2016   Procedure: LEFT TOTAL KNEE REVISION;  Surgeon: Lerner Melodi, MD;  Location: [facility];  Service: Orthopedics;  Laterality: Left;   TOTAL KNEE REVISION Left 04/22/2017   Procedure: Left knee polyethylene revision;  Surgeon: Melodi Lerner, MD;  Location: [facility];  Service: Orthopedics;  Laterality: Left;   UPPER GI ENDOSCOPY  2003   Past Surgical History:  Procedure Laterality Date   ABDOMINAL HYSTERECTOMY Bilateral 1994   TAH, BSO- tranverse incision at 58 yo   ANKLE ARTHROSCOPY WITH RECONSTRUCTION Right 2007   CHOLECYSTECTOMY     laparoscopic   COLONOSCOPY     x3   EYE SURGERY     Left eye 03/02/2018, right 02/15/2018   HEMORROIDECTOMY  08/19/2020   HIP ARTHROSCOPY W/ LABRAL  REPAIR Right 05/11/2013   acetabular labral tear 03/30/2013   KNEE ARTHROPLASTY Right    KNEE JOINT MANIPULATION Left    x3 under anesthesia   KNEE SURGERY Bilateral 1984   Right ACL, left PCL repair   LITHOTRIPSY  2005   LIVER BIOPSY  2013   normal results.   MASTECTOMY Bilateral    prior to 2009   MOUTH SURGERY     NASAL SEPTUM SURGERY N/A 09/20/2015   by ENT Dr. Ethyl   OVARIAN CYST SURGERY Left    size of grapefruit, was informed that she had shortened vagina   SHOULDER SURGERY Bilateral    Right 08/15/2016, Left 11/15/2016   THUMB ARTHROSCOPY Left    THYROIDECTOMY, PARTIAL Left 2008   TOTAL KNEE ARTHROPLASTY Right 08/23/2018   Procedure: TOTAL KNEE ARTHROPLASTY;  Surgeon: Melodi Lerner, MD;  Location: [facility];  Service: Orthopedics;  Laterality: Right;    TOTAL KNEE REVISION Left 02/06/2016   Procedure: LEFT TOTAL KNEE REVISION;  Surgeon: Lerner Melodi, MD;  Location: [facility];  Service: Orthopedics;  Laterality: Left;   TOTAL KNEE REVISION Left 04/22/2017   Procedure: Left knee polyethylene revision;  Surgeon: Melodi Lerner, MD;  Location: [facility];  Service: Orthopedics;  Laterality: Left;   UPPER GI ENDOSCOPY  2003   Past Medical History:  Diagnosis Date   Abnormal weight loss    Anxiety    Arthritis    Cataract    OU   Celiac disease    Cervical neck pain with evidence of disc disease    patient has a cyst    Chronic constipation    Chronic diastolic heart failure (HCC)  Pt. 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   Family history of adverse reaction to anesthesia    family has problems with anesthesia of nausea and vomiting    GERD (gastroesophageal reflux disease)    History of in 20's   Gluten enteropathy    H/O parotitis    right     Hard of hearing    History of kidney stones    History of retinal tear    Bilateral   History of staph infection    required wound vac   Hx-TIA (transient ischemic attack)    2015   Kidney stones 08/2020   LVH (left ventricular hypertrophy) 12/15/2016   Mild, noted on ECHO   MVP (mitral valve prolapse)    NAFL (nonalcoholic fatty liver)    Neck pain    Neuromuscular disorder (HCC)    bilateral neuropathy feet.   Nuclear sclerotic cataract of both eyes 09/08/2019   Pneumonia 12/17/2010   Polycythemia    Polycythemia, secondary    PONV (postoperative nausea and vomiting)    Protein C deficiency    Dr. Lanny Abide   Psoriasis    16 X10 cm psoriatic rash on sole of left foot ; open and occ scant bleeding;    psoriatic arthritis    PTSD (post-traumatic stress disorder)    Scaphoid fracture of wrist 09/23/2013   Seizure (HCC)    childhood, medication until age 84 then weanned completely off   Sleep apnea    split night study last done by Dr. Vear 06/18/15 shows severe OSA, CSA, and hypersomnia, rec bipap   Splenomegaly    Stenosis of ureteropelvic junction (UPJ)    left   [facility]roke [facility])    CVA vs TIA in left cerebrum causing slight right sided weakness-Dr. 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    Wears glasses    Wears hearing aid    BP (!) 145/81   Pulse 89   Ht 5' 8 (1.727 m)   Wt 208 lb 3.2 oz (94.4 kg)   SpO2 94%   BMI 31.66 kg/m   Opioid Risk Score:   Fall Risk Score:  `1  Depression screen [facility] 2/9     02/19/2024    1:38 PM 01/13/2024   11:01 AM 10/26/2023    1:13 PM 08/10/2023    1:12 PM 04/14/2023    9:23 AM 01/16/2023    2:31 PM 12/16/2022   12:59 PM  Depression screen PHQ 2/9  Decreased Interest 0 0 0 0 1 1 1   Down, Depressed, Hopeless 0 0 0 0 1 2 1    PHQ - 2 Score 0 0 0 0 2 3 2   Altered sleeping  3   3 3 1   Tired, decreased energy  2   1 1 1   Change in appetite  0   0 0 1  Feeling bad or failure about yourself   0   0 3 0  Trouble concentrating  0   1 3 1   Moving slowly or fidgety/restless  0   0 0 0  Suicidal thoughts  0   0 0 0  PHQ-9 Score  5   7  13  6    Difficult doing work/chores     Somewhat difficult Very difficult Somewhat difficult     Data saved with a previous flowsheet row definition     Review of Systems  Musculoskeletal:  Positive for back pain.       Right shoulder wrist bilateral feet left knee  All other systems reviewed and are negative.      Objective:   Physical Exam Vitals and nursing note reviewed.  Constitutional:      Appearance: Normal appearance.  Neck:     Comments: Cervical Paraspinal Tenderness: C-4-C-6 Cardiovascular:     Rate and Rhythm: Normal rate and regular rhythm.     Pulses: Normal pulses.     Heart sounds: Normal heart sounds.  Pulmonary:     Effort: Pulmonary effort is normal.     Breath sounds: Normal breath sounds.  Musculoskeletal:     Comments: Normal Muscle Bulk and Muscle Testing Reveals:  Upper Extremities: Right: Decreased ROM 45 Degrees and Muscle Strength5/5 Right chest wall swelling noted  Left Upper Extremity: Full ROM and Muscle Strength 5/5 Bilateral AC Joint Tenderness Lumbar Paraspinal Tenderness: L-3-L-5 Lower Extremities: Full ROM and Muscle Strength 5/5 Left Lower Extremity Flexion Produces Pain into his Popliteal Fossa Arises from Table slowly Narrow Based Gait     Skin:    General: Skin is warm and dry.  Neurological:     Mental Status: He is alert and oriented to person, place, and time.  Psychiatric:        Mood and Affect: Mood normal.        Behavior: Behavior normal.          Assessment & Plan:  1, Cervicalgia: Cervical Radiculitis: Continue to monitor. Continue HEP as Tolerated. Continue to Monitor. 02/19/2024 2.. Psoriatic arthritis  with pain in multiple areas, most prominently feet, hands, elbows. Refilled: MS Contin  30 mg one every 12 hours #60, and Continue  Oxycodone  10 mg one tablet every 8 hours as needed  02/19/2024 We will continue the opioid monitoring program, this consists of regular clinic visits, examinations, urine drug screen, pill counts as well as use of [place]  Controlled Substance Reporting system. A 12 month History has been reviewed on the [place]  Controlled Substance Reporting System 06/06/2022. Rheumatology Following.  3. Chronic Pain Syndrome: Continue Compound Cream. 02/19/2024 4. 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 02/19/2024 5. 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 02/19/2024 S/P TKR on 07/03/14:  Ortho Following: Dr. Melodi perform Total Left Knee Revision on 02/06/2016. S/P Left Knee Revision on 04/22/17. S/P  Right TKA on 08/23/2018 with Dr. Melodi. 6. Chronic mid- low back pain: Continue current medication regime, and Continue Physical therapy. Continue to Monitor. . 02/19/2024 7. Polycythemia: Oncology Following. 02/19/2024 8. Depression with anxiety : Psychiatry following.Continue to Monitor. 02/19/2024 9.  Muscle Spasticity: Continue Baclofen  02/19/2024 10. Right Ankle Pain: No complaints Today. Continue to Monitor. 02/19/2024 11. Left Foot Pain:No complaints today.  Continue to Monitor. 02/19/2024 12. Left lateral epicondylitis:Ortho Following. 02/19/2024 13. RightShoulder Pain: Continue HEP as Tolerated, Continue to Monitor. Ortho Following.02/19/2024 14. Lumbar Radiculitis:  S/P  on 11/06/2022 Lumbar epidural steroid injection under fluoroscopic guidance       We will continue to Monitor. 02/19/2024  F/U in 2 months  "

## 2024-02-22 ENCOUNTER — Encounter (HOSPITAL_BASED_OUTPATIENT_CLINIC_OR_DEPARTMENT_OTHER): Payer: Self-pay

## 2024-02-22 DIAGNOSIS — T148XXA Other injury of unspecified body region, initial encounter: Secondary | ICD-10-CM | POA: Diagnosis not present

## 2024-02-22 DIAGNOSIS — G4731 Primary central sleep apnea: Secondary | ICD-10-CM | POA: Diagnosis not present

## 2024-02-23 ENCOUNTER — Other Ambulatory Visit (HOSPITAL_BASED_OUTPATIENT_CLINIC_OR_DEPARTMENT_OTHER): Payer: Self-pay

## 2024-02-23 MED ORDER — ONDANSETRON 4 MG PO TBDP
4.0000 mg | ORAL_TABLET | Freq: Three times a day (TID) | ORAL | 1 refills | Status: AC | PRN
  Filled 2024-02-23: qty 30, 10d supply, fill #0
  Filled 2024-03-07: qty 30, 10d supply, fill #1

## 2024-02-24 ENCOUNTER — Other Ambulatory Visit (HOSPITAL_BASED_OUTPATIENT_CLINIC_OR_DEPARTMENT_OTHER): Payer: Self-pay

## 2024-02-24 ENCOUNTER — Encounter: Payer: Self-pay | Admitting: Family Medicine

## 2024-02-24 DIAGNOSIS — L649 Androgenic alopecia, unspecified: Secondary | ICD-10-CM

## 2024-02-24 MED ORDER — FINASTERIDE 1 MG PO TABS
1.0000 mg | ORAL_TABLET | Freq: Every day | ORAL | 1 refills | Status: AC
  Filled 2024-02-24: qty 90, 90d supply, fill #0

## 2024-02-26 ENCOUNTER — Other Ambulatory Visit: Payer: Self-pay

## 2024-02-26 ENCOUNTER — Other Ambulatory Visit (HOSPITAL_BASED_OUTPATIENT_CLINIC_OR_DEPARTMENT_OTHER): Payer: Self-pay

## 2024-02-26 ENCOUNTER — Encounter (HOSPITAL_BASED_OUTPATIENT_CLINIC_OR_DEPARTMENT_OTHER): Payer: Self-pay

## 2024-02-27 ENCOUNTER — Other Ambulatory Visit: Payer: Self-pay | Admitting: Registered Nurse

## 2024-02-27 ENCOUNTER — Other Ambulatory Visit (HOSPITAL_BASED_OUTPATIENT_CLINIC_OR_DEPARTMENT_OTHER): Payer: Self-pay

## 2024-02-29 ENCOUNTER — Other Ambulatory Visit (HOSPITAL_BASED_OUTPATIENT_CLINIC_OR_DEPARTMENT_OTHER): Payer: Self-pay

## 2024-02-29 ENCOUNTER — Other Ambulatory Visit: Payer: Self-pay

## 2024-03-02 ENCOUNTER — Other Ambulatory Visit (HOSPITAL_BASED_OUTPATIENT_CLINIC_OR_DEPARTMENT_OTHER): Payer: Self-pay

## 2024-03-02 ENCOUNTER — Other Ambulatory Visit: Payer: Self-pay

## 2024-03-02 MED ORDER — ROPINIROLE HCL 0.25 MG PO TABS
0.2500 mg | ORAL_TABLET | Freq: Every day | ORAL | 1 refills | Status: AC
  Filled 2024-03-02: qty 30, 30d supply, fill #0
  Filled 2024-04-01: qty 30, 30d supply, fill #1

## 2024-03-07 ENCOUNTER — Other Ambulatory Visit (HOSPITAL_BASED_OUTPATIENT_CLINIC_OR_DEPARTMENT_OTHER): Payer: Self-pay

## 2024-03-08 ENCOUNTER — Other Ambulatory Visit: Payer: Self-pay

## 2024-03-08 ENCOUNTER — Other Ambulatory Visit (HOSPITAL_BASED_OUTPATIENT_CLINIC_OR_DEPARTMENT_OTHER): Payer: Self-pay

## 2024-03-09 ENCOUNTER — Other Ambulatory Visit (HOSPITAL_BASED_OUTPATIENT_CLINIC_OR_DEPARTMENT_OTHER): Payer: Self-pay

## 2024-03-11 ENCOUNTER — Other Ambulatory Visit: Payer: Self-pay

## 2024-03-11 ENCOUNTER — Other Ambulatory Visit (HOSPITAL_COMMUNITY): Payer: Self-pay

## 2024-03-11 ENCOUNTER — Other Ambulatory Visit (HOSPITAL_BASED_OUTPATIENT_CLINIC_OR_DEPARTMENT_OTHER): Payer: Self-pay

## 2024-03-11 NOTE — Progress Notes (Signed)
 Signed patient up for a Rinvoq  copay card. Information was sent to his email.  Instructed patient to contact pharmacy once he has savings card information.

## 2024-03-12 ENCOUNTER — Other Ambulatory Visit (HOSPITAL_BASED_OUTPATIENT_CLINIC_OR_DEPARTMENT_OTHER): Payer: Self-pay

## 2024-03-14 ENCOUNTER — Other Ambulatory Visit: Payer: Self-pay

## 2024-03-14 ENCOUNTER — Other Ambulatory Visit (HOSPITAL_COMMUNITY): Payer: Self-pay

## 2024-03-14 NOTE — Progress Notes (Signed)
 Rinvoq  copay and credit card on file. Copay is $0

## 2024-03-15 ENCOUNTER — Other Ambulatory Visit (HOSPITAL_COMMUNITY): Payer: Self-pay

## 2024-03-15 ENCOUNTER — Other Ambulatory Visit (HOSPITAL_BASED_OUTPATIENT_CLINIC_OR_DEPARTMENT_OTHER): Payer: Self-pay

## 2024-03-16 ENCOUNTER — Other Ambulatory Visit (HOSPITAL_BASED_OUTPATIENT_CLINIC_OR_DEPARTMENT_OTHER): Payer: Self-pay

## 2024-03-17 ENCOUNTER — Other Ambulatory Visit: Payer: Self-pay

## 2024-03-17 NOTE — Progress Notes (Signed)
 Clinical Intervention Note  Clinical Intervention Notes: Patient reports a new diagnosis of chronic sinusitis; no anticipated impact on Rinvoq  therapy.   Clinical Intervention Outcomes: Prevention of an adverse drug event   Jesse Bowers

## 2024-03-17 NOTE — Progress Notes (Signed)
 Specialty Pharmacy Refill Coordination Note  Jesse Bowers is a 59 y.o. adult contacted today regarding refills of specialty medication(s) Upadacitinib  (Rinvoq )   Patient requested Delivery   Delivery date: 03/21/24   Verified address: [address]  [place] [phone]   Medication will be filled on: 03/11/24

## 2024-03-18 ENCOUNTER — Other Ambulatory Visit: Payer: Self-pay | Admitting: Registered Nurse

## 2024-03-18 ENCOUNTER — Telehealth: Payer: Self-pay | Admitting: Registered Nurse

## 2024-03-18 ENCOUNTER — Other Ambulatory Visit: Payer: Self-pay

## 2024-03-18 ENCOUNTER — Other Ambulatory Visit (HOSPITAL_BASED_OUTPATIENT_CLINIC_OR_DEPARTMENT_OTHER): Payer: Self-pay

## 2024-03-18 DIAGNOSIS — M47816 Spondylosis without myelopathy or radiculopathy, lumbar region: Secondary | ICD-10-CM

## 2024-03-18 DIAGNOSIS — M542 Cervicalgia: Secondary | ICD-10-CM

## 2024-03-18 DIAGNOSIS — G894 Chronic pain syndrome: Secondary | ICD-10-CM

## 2024-03-18 MED ORDER — MORPHINE SULFATE ER 30 MG PO TBCR
30.0000 mg | EXTENDED_RELEASE_TABLET | Freq: Two times a day (BID) | ORAL | 0 refills | Status: AC
  Filled 2024-03-18: qty 60, 30d supply, fill #0

## 2024-03-18 NOTE — Telephone Encounter (Signed)
 PDMP was Reviewed.  MS Contin  E- Scribed to pharmacy.  [name] is aware via My-Chart

## 2024-03-22 ENCOUNTER — Ambulatory Visit: Admitting: Family Medicine

## 2024-03-22 ENCOUNTER — Other Ambulatory Visit (HOSPITAL_BASED_OUTPATIENT_CLINIC_OR_DEPARTMENT_OTHER): Payer: Self-pay

## 2024-03-22 ENCOUNTER — Encounter: Payer: Self-pay | Admitting: Family Medicine

## 2024-03-22 VITALS — BP 98/62 | HR 88 | Temp 97.4°F | Ht 68.0 in | Wt 204.5 lb

## 2024-03-22 DIAGNOSIS — G3184 Mild cognitive impairment, so stated: Secondary | ICD-10-CM | POA: Diagnosis not present

## 2024-03-22 DIAGNOSIS — L4050 Arthropathic psoriasis, unspecified: Secondary | ICD-10-CM | POA: Diagnosis not present

## 2024-03-22 DIAGNOSIS — I5032 Chronic diastolic (congestive) heart failure: Secondary | ICD-10-CM | POA: Diagnosis not present

## 2024-03-22 DIAGNOSIS — F321 Major depressive disorder, single episode, moderate: Secondary | ICD-10-CM

## 2024-03-22 NOTE — Assessment & Plan Note (Signed)
 Managed by his rheumatologist, continue per their orders

## 2024-03-22 NOTE — Assessment & Plan Note (Signed)
 Chronic, managed by psychiatry, continue medications per their orders

## 2024-03-22 NOTE — Assessment & Plan Note (Signed)
 Sees cardiology regularly, continue medications as prescribed by the specialist.

## 2024-03-22 NOTE — Progress Notes (Signed)
 "  Established Patient Office Visit  Subjective   Patient ID: Jesse Bowers, adult    DOB: Dec 19, 1965  Age: 59 y.o. MRN: [id number]  Chief Complaint  Patient presents with   Results    Patient requests to discuss results of CT and MRI ordered by Dr Liza    HPI  Discussed the use of AI scribe software for clinical note transcription with the patient, who gave verbal consent to proceed.  History of Present Illness   Jesse Bowers is a 59 year old male with mild cognitive impairment who presents with concerns about cognitive decline and recent weight loss.  He reports worsening memory lapses, including misplacing items like keys in the refrigerator, and is concerned about progressive cognitive decline. His father had dementia and his mother had Alzheimer's disease. A CT head in 2022 showed mild cerebral volume loss and an MRI in 2016 showed some atrophy. He is currently unable to get a repeat MRI due to his sleep device requirements.  He reports unintentional weight loss from 217 lb in September to 204 lb now, which he attributes to persistent nausea. A recent barium swallow showed esophageal dysmotility and a hiatal hernia. He has not noticed any worsening of nausea with metformin .  He has a non-healing wound on his finger for 10 days after a razor cut while moving, and a new wound on his toe. He has psoriatic arthritis and psoriasis, which may impair wound healing. He uses moisturizers regularly.  He has been on long-term immunosuppressant medications for over 10 years.      Current Outpatient Medications  Medication Instructions   baclofen  (LIORESAL ) 20-40 mg, Oral, 4 times daily, 1 tablet with breakfast, lunch, and dinner and 2 tablets at bedtime   clobetasol  cream (TEMOVATE ) 0.05 % 1 Application, Topical, 2 times daily   desonide  (DESOWEN ) 0.05 % ointment Apply 1 Application topically 2 (two) times daily to thin areas of skin including face   desvenlafaxine  (PRISTIQ ) 50  mg, Oral, Daily   famotidine  (PEPCID ) 40 mg, Oral, Nightly   finasteride  (PROPECIA ) 1 mg, Oral, Daily   folic acid  (FOLVITE ) 1 MG tablet Take 1 tablet (1,000 mcg total) by mouth daily.   furosemide  (LASIX ) 40 MG tablet Take 1.5 tablets (60 mg total) by mouth in the morning AND 1 tablet (40 mg total) every evening.   furosemide  (LASIX ) 20 mg, Oral, Daily PRN   glucose blood test strip Check blood sugar daily and as needed.   hydrocortisone -nystatin -lidocaine -diphenhydrAMINE  5 mLs, Oral, 4 times daily, Swish and spit out.   hydrOXYzine  (ATARAX ) 25 MG tablet Take 1 tablet (25 mg total) by mouth 3 (three) times daily AND 2 tablets (50 mg total) at bedtime as needed for sleep/anxiety   metFORMIN  (GLUCOPHAGE ) 1,000 mg, Oral, 2 times daily with meals   Methotrexate  2 MG/ML SOLN 25 mLs, Weekly   methotrexate  25 mg, Subcutaneous, Weekly   metoprolol  tartrate (LOPRESSOR ) 25 mg, Oral, 2 times daily PRN   morphine  (MS CONTIN ) 30 mg, Oral, Every 12 hours   ondansetron  (ZOFRAN ) 8 MG tablet Take 1 tablet (8 mg total) by mouth every 8 (eight) hours as needed for nausea or vomiting.   ondansetron  (ZOFRAN -ODT) 4 MG disintegrating tablet Take 1 tablet (4 mg total) by mouth every 8 (eight) hours as needed for nausea or vomiting   ondansetron  (ZOFRAN -ODT) 4 mg, Every 8 hours PRN   Oxycodone  HCl 10 mg, Oral, Every 8 hours PRN   Pancrelipase , Lip-Prot-Amyl, (CREON ) 24000-76000 units CPEP  1-2 capsules, 3 times daily with meals   potassium chloride  (KLOR-CON ) 10 MEQ tablet Take 2 tablets by mouth every day.   Rinvoq  15 mg, Oral, Daily   rivaroxaban  (XARELTO ) 20 MG TABS tablet Take 1 tablet (20 mg total) by mouth nightly.   rOPINIRole  (REQUIP ) 0.25 mg, Oral, Daily at bedtime   Tenapanor  HCl (IBSRELA ) 50 MG TABS Take 1 tablet (50 mg total) by mouth before breakfast and before evening meal.   testosterone  cypionate (DEPOTESTOSTERONE CYPIONATE) 100 mg, Intramuscular, Every 7 days   TUBERCULIN SYR 1CC/27GX1/2 (B-D TB  SYRINGE 1CC/27GX1/2) 27G X 1/2 1 ML MISC Use 1 syringe to inject under the skin once weekly   Vilazodone  HCl (VIIBRYD ) 40 mg, Oral, Daily   Vitamin D  (Ergocalciferol ) (DRISDOL ) 50,000 Units, Oral, Weekly   Vonoprazan Fumarate  (VOQUEZNA ) 10 MG TABS Take 1 tablet (10 mg total) by mouth daily.   Xarelto  20 mg, Oral, Nightly    Patient Active Problem List   Diagnosis Date Noted   Major depressive disorder, single episode, moderate (HCC) 01/21/2024   Pain in joint, ankle and foot 01/13/2024   Thoracic spine pain 12/10/2022   Bilateral lumbar radiculopathy 10/31/2022   Lumbar disc disease 08/06/2022   Iliotibial band syndrome 08/06/2022   Non-recurrent acute suppurative otitis media of both ears without spontaneous rupture of tympanic membranes 07/09/2022   Weight gain due to medication 07/09/2022   Pancreatic insufficiency 04/09/2022   Posterior vitreous detachment of right eye 11/18/2021   Spondylosis of lumbar spine 10/03/2020   Pseudophakia of both eyes 05/07/2020   Retinal telangiectasis of both eyes 05/07/2020   Lattice degeneration of peripheral retina, left 09/08/2019   Lattice degeneration, right eye 09/08/2019   Chronic diastolic heart failure (HCC) 05/24/2019   Urinary dysfunction 04/12/2019   Osteoarthritis of right knee 08/23/2018   Constipation due to opioid therapy 03/30/2018   SNHL (sensorineural hearing loss) 12/04/2017   Abnormal urinary stream 12/03/2017   Osteoarthritis of carpometacarpal (CMC) joint of thumb 11/30/2017   Muscle weakness 11/17/2017   Transient vision disturbance 11/12/2017   Bilateral hand pain 10/30/2017   Pain of left hip joint 10/09/2017   Gynecomastia 07/10/2017   Iron deficiency anemia 07/05/2017   Spasticity 05/20/2017   Lumbar radiculitis 04/20/2017   Ulnar 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, left 11/28/2016   Idiopathic peripheral neuropathy 11/28/2016   Failed total knee arthroplasty, 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   Long term (current) use of anticoagulants  08/23/2015   Right upper quadrant abdominal pain 08/23/2015   Memory loss 05/10/2015   Gait abnormality 04/07/2015   Alkaline phosphatase elevation 04/07/2015   History of thrombosis 03/26/2015   Medial epicondylitis 02/07/2015   Cognitive decline 12/21/2014   Leukopenia 12/05/2014   Rotator cuff syndrome of right shoulder 10/27/2014   Status post left knee replacement 08/22/2014   Left lateral epicondylitis 08/22/2014   Chronic cerebral ischemia 08/18/2014   Arthrofibrosis of knee joint 08/17/2014   Cubital canal compression syndrome, left 08/17/2014   Chronic pain syndrome 04/10/2014   Insomnia 04/10/2014   Chronic non-specific white matter lesions on MRI 04/10/2014   CFS (chronic fatigue syndrome) 04/10/2014   Biceps tendonitis on left 03/01/2014   H/O TIA (transient ischemic attack) and stroke 12/27/2013   Neck pain 12/27/2013   OSA (obstructive sleep apnea) 12/08/2013   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/31/2013   Protein C deficiency (HCC) 08/01/2013   Post traumatic stress disorder (PTSD) 08/01/2013   Speech abnormality 07/25/2013   Obesity 05/10/2013   Lower extremity edema 05/10/2013  GERD (gastroesophageal reflux disease) 05/10/2013   Arthritis 05/10/2013   OA (osteoarthritis) of knee 03/15/2013   Headache 10/25/2012   Palpitations 10/18/2012   Fatty liver determined by biopsy 06/01/2012   Arthropathic psoriasis, unspecified (HCC) 04/29/2012   Abnormal liver enzymes 03/29/2012   Psoriatic arthritis (HCC) 12/29/2011   Left Renal Hydronephrosis 12/11/2010   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     Objective:     BP 98/62   Pulse 88   Temp (!) 97.4 F (36.3 C) (Oral)   Ht 5' 8 (1.727 m)   Wt 204 lb 8 oz (92.8 kg)   SpO2 98%   BMI 31.09 kg/m    Physical  Exam Vitals reviewed.  Constitutional:      Appearance: Normal appearance.  Pulmonary:     Effort: Pulmonary effort is normal.  Neurological:     Mental Status: He is alert and oriented to person, place, and time. Mental status is at baseline.  Psychiatric:        Mood and Affect: Mood normal.        Behavior: Behavior normal.      No results found for any visits on 03/22/24.    The ASCVD Risk score (Arnett DK, et al., 2019) failed to calculate for the following reasons:   Risk score cannot be calculated because patient has a medical history suggesting prior/existing ASCVD   * - Cholesterol units were assumed    Assessment & Plan:  Mild cognitive impairment  Psoriatic arthritis (HCC) Assessment & Plan: Managed by his rheumatologist, continue per their orders   Chronic diastolic heart failure (HCC) Assessment & Plan: Sees cardiology regularly, continue medications as prescribed by the specialist.   Major depressive disorder, single episode, moderate (HCC) Assessment & Plan: Chronic, managed by psychiatry, continue medications per their orders    Assessment and Plan    Mild cognitive impairment Chronic mild cognitive impairment with concerns about progression due to family history of dementia and Alzheimer's disease. CT scan shows mild cerebral volume loss, consistent with age-related changes. No acute infarcts, masses, or hemorrhage. MRI not feasible due to sleep device. No current medications recommended for mild cognitive impairment. Lifestyle modifications and management of cardiovascular risk factors are emphasized. No evidence supports the use of medications like Aricept and Namenda at this stage. - Continue lifestyle modifications including nutrition, exercise, and cognitive training. - Continue managing cardiovascular risk factors. - Avoid medications that may worsen dementia, such as antihistamines and benzodiazepines. - Maintain follow-up with neurologist for  ongoing management and updates on new treatments.  Psoriatic arthritis Chronic psoriatic arthritis with associated psoriasis. Current management includes immunosuppressants, which may complicate wound healing.  Esophageal dysmotility and hiatal hernia Chronic esophageal dysmotility and hiatal hernia confirmed by barium swallow. Symptoms include nausea, particularly after eating and bending over. No indication that metformin  exacerbates nausea. - Continue current management and monitor symptoms.  Non-healing finger wound Non-healing wound on finger, present for 10 days, likely due to location and frequent use. No signs of infection. Healing may be complicated by immunosuppressants for psoriatic arthritis. - Monitor wound healing. - If wound does not heal by February, send a picture via MyChart for further evaluation.     I personally spent a total of 20 minutes in the care of the patient today including preparing to see  the patient, getting/reviewing separately obtained history, counseling and educating, and documenting clinical information in the EHR.    No follow-ups on file.    Heron CHRISTELLA Sharper, MD "

## 2024-03-23 ENCOUNTER — Other Ambulatory Visit: Payer: Self-pay

## 2024-03-25 ENCOUNTER — Other Ambulatory Visit: Payer: Self-pay

## 2024-03-25 ENCOUNTER — Other Ambulatory Visit (HOSPITAL_BASED_OUTPATIENT_CLINIC_OR_DEPARTMENT_OTHER): Payer: Self-pay

## 2024-03-25 MED ORDER — PREDNISONE 5 MG PO TABS
ORAL_TABLET | ORAL | 0 refills | Status: AC
  Filled 2024-03-25: qty 30, 12d supply, fill #0

## 2024-03-28 ENCOUNTER — Other Ambulatory Visit: Payer: Self-pay

## 2024-03-28 ENCOUNTER — Encounter: Payer: Self-pay | Admitting: Family Medicine

## 2024-03-29 ENCOUNTER — Other Ambulatory Visit (HOSPITAL_COMMUNITY): Payer: Self-pay

## 2024-03-31 ENCOUNTER — Other Ambulatory Visit (HOSPITAL_BASED_OUTPATIENT_CLINIC_OR_DEPARTMENT_OTHER): Payer: Self-pay

## 2024-04-01 ENCOUNTER — Other Ambulatory Visit: Payer: Self-pay

## 2024-04-01 ENCOUNTER — Other Ambulatory Visit (HOSPITAL_BASED_OUTPATIENT_CLINIC_OR_DEPARTMENT_OTHER): Payer: Self-pay

## 2024-04-05 ENCOUNTER — Other Ambulatory Visit (HOSPITAL_BASED_OUTPATIENT_CLINIC_OR_DEPARTMENT_OTHER): Payer: Self-pay

## 2024-04-05 ENCOUNTER — Other Ambulatory Visit: Payer: Self-pay

## 2024-04-08 ENCOUNTER — Other Ambulatory Visit: Payer: Self-pay

## 2024-04-13 ENCOUNTER — Ambulatory Visit: Admitting: Physical Medicine & Rehabilitation

## 2024-04-29 ENCOUNTER — Encounter: Admitting: Family Medicine

## 2024-07-21 ENCOUNTER — Encounter: Admitting: Family Medicine
